# Patient Record
Sex: Female | Born: 1965 | Race: Black or African American | Hispanic: No | Marital: Married | Smoking: Never smoker
Health system: Southern US, Community
[De-identification: ages and names within clinical notes are randomized; demographics above are authoritative.]

## PROBLEM LIST (undated history)

## (undated) DIAGNOSIS — J449 Chronic obstructive pulmonary disease, unspecified: Secondary | ICD-10-CM

## (undated) DIAGNOSIS — I10 Essential (primary) hypertension: Secondary | ICD-10-CM

## (undated) DIAGNOSIS — G40909 Epilepsy, unspecified, not intractable, without status epilepticus: Secondary | ICD-10-CM

## (undated) DIAGNOSIS — J45909 Unspecified asthma, uncomplicated: Secondary | ICD-10-CM

## (undated) DIAGNOSIS — J9621 Acute and chronic respiratory failure with hypoxia: Secondary | ICD-10-CM

## (undated) DIAGNOSIS — F419 Anxiety disorder, unspecified: Secondary | ICD-10-CM

## (undated) HISTORY — PX: WISDOM TOOTH EXTRACTION: SHX21

## (undated) NOTE — *Deleted (*Deleted)
[facility] EMERGENCY DEPARTMENT Provider Note   CSN: [id number] Arrival date & time: 01/28/20  1423     History Chief Complaint  Patient presents with  . Altered Mental Status    Kathleen Cameron is a 49 y.o. female history of COPD, hypertension, who presented with altered mental status, and shortness of breath.  Patient has been having shortness of breath for the last week or so.  She states that it is worse with exertion.  She is on 2 L all the time and still gets very short of breath.  Patient also is more confused than usual.  Patient states that she has been seeing little babies but she knows they are not real.  Patient denies any drug use.  Patient states that she been compliant with her medicines.  She noticed bilateral lower extremity edema that comes and goes.  The history is provided by the patient.       Past Medical History:  Diagnosis Date  . Asthma   . COPD (chronic obstructive pulmonary disease) (HCC)   . Hypertension     Patient Active Problem List   Diagnosis Date Noted  . Anxiety 12/02/2019  . COPD exacerbation (HCC) 11/06/2019  . Depression 11/06/2019  . Acute on chronic respiratory failure with hypoxia and hypercapnia (HCC) 11/06/2019  . Respiratory failure (HCC) 11/06/2019  . Current moderate episode of major depressive disorder without prior episode (HCC) 09/20/2019  . Pedal edema 08/04/2019  . Chronic respiratory failure with hypoxia (HCC) 03/07/2019  . CKD (chronic kidney disease) 01/05/2019  . COPD (chronic obstructive pulmonary disease) (HCC) 09/29/2018  . Pulmonary nodules 09/27/2018  . Dyslipidemia 07/07/2018  . Essential hypertension 03/10/2018    History reviewed. No pertinent surgical history.   OB History   No obstetric history on file.     Family History  Family history unknown: Yes    Social History   Tobacco Use  . Smoking status: Never Smoker  . Smokeless tobacco: Never Used  Substance Use Topics   . Alcohol use: Yes    Alcohol/week: 2.0 standard drinks    Types: 2 Cans of beer per week  . Drug use: Never    Home Medications Prior to Admission medications   Medication Sig Start Date End Date Taking? Authorizing Provider  acetaminophen (TYLENOL) 500 MG tablet Take 500 mg by mouth daily as needed for mild pain or headache.   Yes [provider]  albuterol (VENTOLIN HFA) 108 (90 Base) MCG/ACT inhaler Inhale into the lungs every 6 (six) hours as needed for wheezing or shortness of breath.   Yes [provider]  ALPRAZolam (XANAX) 0.25 MG tablet Take 1 tablet (0.25 mg total) by mouth 3 (three) times daily as needed for anxiety. Patient taking differently: Take 0.25 mg by mouth 3 (three) times daily as needed for anxiety (shingles pain).  01/02/20  Yes Sagardia, Eilleen Kempf, MD  alum & mag hydroxide-simeth (MAALOX/MYLANTA) 200-200-20 MG/5ML suspension Take 15 mLs by mouth every 6 (six) hours as needed for indigestion or heartburn. 11/12/19  Yes Shah, Pratik D, DO  amLODipine (NORVASC) 5 MG tablet Take 1 tablet (5 mg total) by mouth daily. 08/09/19 02/19/20 Yes SagardiaEilleen Kempf, MD  aspirin EC 81 MG EC tablet Take 1 tablet (81 mg total) by mouth daily. 04/17/19  Yes [name], MD  bisoprolol (ZEBETA) 10 MG tablet Take 1 tablet (10 mg total) by mouth daily. 10/20/19  Yes Sagardia, Eilleen Kempf, MD  Budeson-Glycopyrrol-Formoterol (BREZTRI AEROSPHERE) 160-9-4.8  MCG/ACT AERO Inhale 2 puffs into the lungs in the morning and at bedtime. Patient taking differently: Inhale 1 puff into the lungs See admin instructions. Inhale 1 puff into the lungs when walking to bathroom - 6-7 times daily 12/02/19  Yes [name], NP  Dextromethorphan-guaiFENesin [facility] DM MAXIMUM STRENGTH) 60-1200 MG TB12 Take 1 tablet by mouth 2 (two) times daily as needed (congestion).   Yes [provider]  escitalopram (LEXAPRO) 10 MG tablet Take 10 mg by mouth daily. 01/02/20  Yes [provider]  famotidine (PEPCID) 10 MG tablet Take 10 mg by mouth 2 (two) times daily as needed for heartburn or indigestion (acid reflux).   Yes [provider]  hydrochlorothiazide (HYDRODIURIL) 25 MG tablet Take 25 mg by mouth daily. 09/07/19  Yes [provider]  ipratropium-albuterol (DUONEB) 0.5-2.5 (3) MG/3ML SOLN Take 3 mLs by nebulization every 6 (six) hours as needed. Patient taking differently: Take 3 mLs by nebulization every 6 (six) hours as needed (Shortness of Breath).  05/03/19  Yes [name], NP  nitroGLYCERIN (NITROSTAT) 0.4 MG SL tablet PLACE 1 TABLET(0.4 MG TOTAL) UNDER THE TONGUE EVERY 5 MINUTES AS NEEDED FOR CHEST PAIN Patient taking differently: Place 0.4 mg under the tongue every 5 (five) minutes as needed for chest pain.  01/10/20  Yes SagardiaEilleen Kempf, MD  OXYGEN Inhale 2 L into the lungs continuous.   Yes [provider]  pantoprazole (PROTONIX) 40 MG tablet Take 1 tablet (40 mg total) by mouth daily. 10/27/19  Yes Sagardia, Eilleen Kempf, MD  polyethylene glycol (MIRALAX / GLYCOLAX) 17 g packet Take 17 g by mouth daily as needed (constipation).   Yes [provider]  prednisoLONE acetate (PRED FORTE) 1 % ophthalmic suspension Place 1 drop into the left eye 4 (four) times daily. 01/10/20  Yes [provider]  rosuvastatin (CRESTOR) 20 MG tablet Take 20 mg by mouth daily.   Yes [provider]  valACYclovir (VALTREX) 1000 MG tablet Take 1,000 mg by mouth daily. 01/10/20  Yes [provider]  escitalopram (LEXAPRO) 20 MG tablet Take 1 tablet (20 mg total) by mouth daily. Patient not taking: Reported on 01/28/2020 01/02/20 04/01/20  [name], MD  HYDROcodone-acetaminophen [facility]) 5-325 MG tablet Take 1 tablet by mouth every 6 (six) hours as needed. Patient not taking: Reported on 01/28/2020 01/19/20   [name], MD  rosuvastatin (CRESTOR) 40 MG tablet Take 1 tablet (40 mg total) by  mouth daily. Patient not taking: Reported on 01/28/2020 01/25/20 01/19/21  [name], MD    Allergies    Stiolto respimat [tiotropium bromide-olodaterol]  Review of Systems   Review of Systems  Psychiatric/Behavioral: Positive for confusion.  All other systems reviewed and are negative.   Physical Exam Updated Vital Signs BP (!) 155/92   Pulse (!) 58   Temp 97.8 F (36.6 C) (Oral)   Resp 16   SpO2 97%   Physical Exam Vitals and nursing note reviewed.  Constitutional:      Comments: Confused and sleepy but arousable  HENT:     Head: Normocephalic.     Nose: Nose normal.     Mouth/Throat:     Mouth: Mucous membranes are moist.  Eyes:     Extraocular Movements: Extraocular movements intact.     Pupils: Pupils are equal, round, and reactive to light.  Cardiovascular:     Rate and Rhythm: Normal rate and regular rhythm.     Pulses:  Normal pulses.     Heart sounds: Normal heart sounds.  Pulmonary:     Comments: Crackles bilateral bases, minimal wheezing throughout Abdominal:     General: Abdomen is flat.     Palpations: Abdomen is soft.  Musculoskeletal:        General: Normal range of motion.     Cervical back: Normal range of motion.     Comments: 1+ edema bilaterally  Skin:    General: Skin is warm.     Capillary Refill: Capillary refill takes less than 2 seconds.  Neurological:     Comments: Confused and ANO x2, normal strength and sensation bilateral arms and legs.  Psychiatric:        Mood and Affect: Mood normal.     ED Results / Procedures / Treatments   Labs (all labs ordered are listed, but only abnormal results are displayed) Labs Reviewed  COMPREHENSIVE METABOLIC PANEL - Abnormal; Notable for the following components:      Result Value   Sodium 123 (*)    Potassium 3.4 (*)    Chloride 72 (*)    CO2 37 (*)    Glucose, Bld 127 (*)    All other components within normal limits  CBC WITH DIFFERENTIAL/PLATELET - Abnormal; Notable for the  following components:   RBC 3.79 (*)    Hemoglobin 10.9 (*)    HCT 34.2 (*)    All other components within normal limits  URINALYSIS, ROUTINE W REFLEX MICROSCOPIC - Abnormal; Notable for the following components:   Protein, ur 30 (*)    All other components within normal limits  I-STAT ARTERIAL BLOOD GAS, ED - Abnormal; Notable for the following components:   pCO2 arterial 90.1 (*)    pO2, Arterial 67 (*)    Bicarbonate 53.1 (*)    TCO2 >50 (*)    Acid-Base Excess 23.0 (*)    Sodium 119 (*)    Potassium 3.3 (*)    All other components within normal limits  RESPIRATORY PANEL BY RT PCR (FLU A&B, COVID)  ETHANOL  RAPID URINE DRUG SCREEN, HOSP PERFORMED  OSMOLALITY  BRAIN NATRIURETIC PEPTIDE  OSMOLALITY, URINE  SODIUM, URINE, RANDOM  CBG MONITORING, ED  TROPONIN I (HIGH SENSITIVITY)    EKG EKG Interpretation  Date/Time:  Saturday January 28 2020 14:27:07 EDT Ventricular Rate:  57 PR Interval:    QRS Duration: 92 QT Interval:  475 QTC Calculation: 463 R Axis:   -47 Text Interpretation: Sinus rhythm Left anterior fascicular block Anteroseptal infarct, old Abnormal T, consider ischemia, lateral leads ST elevation, consider inferior injury poor baseline. grossly unchanged since previous Confirmed by Richardean Canal ([id number]) on 01/28/2020 2:58:16 PM   Radiology DG Chest Port 1 View  Result Date: 01/28/2020 CLINICAL DATA:  Altered mental status EXAM: PORTABLE CHEST 1 VIEW COMPARISON:  Chest radiograph November 09, 2019 FINDINGS: Multiple monitoring leads overlie the patient. Stable cardiac and mediastinal contours. Tortuosity of the thoracic aorta. No large area pulmonary consolidation. No pleural effusion or pneumothorax. Thoracic spine degenerative changes. IMPRESSION: No active disease. Electronically Signed   By: Annia Belt M.D.   On: 01/28/2020 16:22    Procedures Procedures (including critical care time)  CRITICAL CARE Performed by: Richardean Canal   Total critical care time:30  minutes  Critical care time was exclusive of separately billable procedures and treating other patients.  Critical care was necessary to treat or prevent imminent or life-threatening deterioration.  Critical care was time spent personally by me  on the following activities: development of treatment plan with patient and/or surrogate as well as nursing, discussions with consultants, evaluation of patient's response to treatment, examination of patient, obtaining history from patient or surrogate, ordering and performing treatments and interventions, ordering and review of laboratory studies, ordering and review of radiographic studies, pulse oximetry and re-evaluation of patient's condition.    Medications Ordered in ED Medications - No data to display  ED Course  I have reviewed the triage vital signs and the nursing notes.  Pertinent labs & imaging results that were available during my care of the patient were reviewed by me and considered in my medical decision making (see chart for details).    MDM Rules/Calculators/A&P                         Kathleen Cameron is a 33 y.o. female history of COPD here presenting with confusion and worsening shortness of breath. Worsening shortness of breath likely secondary to either COPD versus CHF.  Confusion could be multifactorial such as pneumonia or UTI or electrolyte abnormalities or brain mass.  Plan to get CT head and chest x-ray and urinalysis and CBC and CMP and troponin and BNP.   7:09 PM Sodium is 123.  Patient's pH is 7.37 and CO2 is 90 so she is compensated.  Albuterol and Solu-Medrol.  Patient CT head did not show a mass.  I think her hyponatremia likely secondary to hydrochlorothiazide.  Will fluid restrict patient.  Will admit for hyponatremia, COPD exacerbation. COVID negative.    Final Clinical Impression(s) / ED Diagnoses Final diagnoses:  None    Rx / DC Orders ED Discharge Orders    None       Charlynne Pander, MD  01/28/20 1910

---

## 1997-09-22 ENCOUNTER — Ambulatory Visit (HOSPITAL_COMMUNITY): Admission: RE | Admit: 1997-09-22 | Discharge: 1997-09-22 | Payer: Self-pay | Admitting: *Deleted

## 1997-10-02 ENCOUNTER — Other Ambulatory Visit: Admission: RE | Admit: 1997-10-02 | Discharge: 1997-10-02 | Payer: Self-pay | Admitting: *Deleted

## 1997-10-06 ENCOUNTER — Ambulatory Visit (HOSPITAL_COMMUNITY): Admission: RE | Admit: 1997-10-06 | Discharge: 1997-10-06 | Payer: Self-pay | Admitting: *Deleted

## 1998-01-20 ENCOUNTER — Inpatient Hospital Stay (HOSPITAL_COMMUNITY): Admission: AD | Admit: 1998-01-20 | Discharge: 1998-01-22 | Payer: Self-pay | Admitting: *Deleted

## 1998-03-06 ENCOUNTER — Inpatient Hospital Stay (HOSPITAL_COMMUNITY): Admission: AD | Admit: 1998-03-06 | Discharge: 1998-03-06 | Payer: Self-pay | Admitting: *Deleted

## 1998-03-09 ENCOUNTER — Inpatient Hospital Stay (HOSPITAL_COMMUNITY): Admission: AD | Admit: 1998-03-09 | Discharge: 1998-03-11 | Payer: Self-pay | Admitting: Obstetrics & Gynecology

## 2008-11-12 ENCOUNTER — Inpatient Hospital Stay (HOSPITAL_COMMUNITY): Admission: EM | Admit: 2008-11-12 | Discharge: 2008-11-13 | Payer: Self-pay | Admitting: Emergency Medicine

## 2010-07-28 LAB — URINALYSIS, ROUTINE W REFLEX MICROSCOPIC
Bilirubin Urine: NEGATIVE
Glucose, UA: NEGATIVE mg/dL
Ketones, ur: NEGATIVE mg/dL
Nitrite: NEGATIVE
Protein, ur: NEGATIVE mg/dL
Specific Gravity, Urine: 1.016 (ref 1.005–1.030)
Urobilinogen, UA: 1 mg/dL (ref 0.0–1.0)
pH: 7.5 (ref 5.0–8.0)

## 2010-07-28 LAB — COMPREHENSIVE METABOLIC PANEL
ALT: 12 U/L (ref 0–35)
AST: 20 U/L (ref 0–37)
Albumin: 4.2 g/dL (ref 3.5–5.2)
Alkaline Phosphatase: 44 U/L (ref 39–117)
BUN: 13 mg/dL (ref 6–23)
CO2: 26 mEq/L (ref 19–32)
Calcium: 9.5 mg/dL (ref 8.4–10.5)
Chloride: 104 mEq/L (ref 96–112)
Creatinine, Ser: 0.79 mg/dL (ref 0.4–1.2)
GFR calc Af Amer: >60 mL/min (ref >60)
GFR calc non Af Amer: >60 mL/min (ref >60)
Glucose, Bld: 97 mg/dL (ref 70–99)
Potassium: 3.8 mEq/L (ref 3.5–5.1)
Sodium: 137 mEq/L (ref 135–145)
Total Bilirubin: 0.8 mg/dL (ref 0.3–1.2)
Total Protein: 7.5 g/dL (ref 6.0–8.3)

## 2010-07-28 LAB — CBC
HCT: 34 % — ABNORMAL LOW (ref 36.0–46.0)
HCT: 40 % (ref 36.0–46.0)
Hemoglobin: 11.9 g/dL — ABNORMAL LOW (ref 12.0–15.0)
Hemoglobin: 14 g/dL (ref 12.0–15.0)
MCHC: 35 g/dL (ref 30.0–36.0)
MCHC: 35 g/dL (ref 30.0–36.0)
MCV: 89.1 fL (ref 78.0–100.0)
MCV: 89.6 fL (ref 78.0–100.0)
Platelets: 170 10*3/uL (ref 150–400)
Platelets: 190 10*3/uL (ref 150–400)
RBC: 3.82 MIL/uL — ABNORMAL LOW (ref 3.87–5.11)
RBC: 4.46 MIL/uL (ref 3.87–5.11)
RDW: 13 % (ref 11.5–15.5)
RDW: 13.1 % (ref 11.5–15.5)
WBC: 5.8 10*3/uL (ref 4.0–10.5)
WBC: 5.9 10*3/uL (ref 4.0–10.5)

## 2010-07-28 LAB — LIPID PANEL
Cholesterol: 232 mg/dL — ABNORMAL HIGH (ref 0–200)
HDL: 73 mg/dL (ref >39)
LDL Cholesterol: 136 mg/dL — ABNORMAL HIGH (ref 0–99)
Total CHOL/HDL Ratio: 3.2 RATIO
Triglycerides: 114 mg/dL (ref <150)
VLDL: 23 mg/dL (ref 0–40)

## 2010-07-28 LAB — URINE MICROSCOPIC-ADD ON

## 2010-07-28 LAB — DIFFERENTIAL
Basophils Absolute: 0.1 10*3/uL (ref 0.0–0.1)
Basophils Relative: 1 % (ref 0–1)
Eosinophils Absolute: 0.1 10*3/uL (ref 0.0–0.7)
Eosinophils Relative: 2 % (ref 0–5)
Lymphocytes Relative: 26 % (ref 12–46)
Lymphs Abs: 1.6 10*3/uL (ref 0.7–4.0)
Monocytes Absolute: 0.4 10*3/uL (ref 0.1–1.0)
Monocytes Relative: 7 % (ref 3–12)
Neutro Abs: 3.8 10*3/uL (ref 1.7–7.7)
Neutrophils Relative %: 64 % (ref 43–77)

## 2010-07-28 LAB — RAPID URINE DRUG SCREEN, HOSP PERFORMED
Amphetamines: NOT DETECTED
Barbiturates: NOT DETECTED
Benzodiazepines: NOT DETECTED
Cocaine: NOT DETECTED
Opiates: NOT DETECTED
Tetrahydrocannabinol: POSITIVE — AB

## 2010-07-28 LAB — POCT CARDIAC MARKERS
CKMB, poc: <1 ng/mL — ABNORMAL LOW (ref 1.0–8.0)
Myoglobin, poc: 37.7 ng/mL (ref 12–200)
Troponin i, poc: <0.05 ng/mL (ref 0.00–0.09)

## 2010-07-28 LAB — PROTIME-INR
INR: 1 (ref 0.00–1.49)
Prothrombin Time: 13.2 seconds (ref 11.6–15.2)

## 2010-07-28 LAB — TSH: TSH: 2.52 u[IU]/mL (ref 0.350–4.500)

## 2010-09-03 NOTE — Discharge Summary (Signed)
Kathleen Cameron, Kathleen Cameron        ACCOUNT NO.:  [id number]   MEDICAL RECORD NO.:  [id number]          PATIENT TYPE:  INP   LOCATION:  3703                         FACILITY:  [facility]   PHYSICIAN:  Eduard Clos, MDDATE OF BIRTH:  [birth year]   DATE OF ADMISSION:  11/12/2008  DATE OF DISCHARGE:                               DISCHARGE SUMMARY   COURSE IN HOSPITAL:  A 45 year old female with no significant past  medical history presenting with complaints of epistaxis, which was  profuse per patient.  On admission, the patient was found to have blood  pressure on 240/140, was started on antihypertensive, IV hydralazine  p.r.n. along with p.o. hydralazine and metoprolol.  The patient did not  have any headache or any chest pain, was not short of breath.  Her  epistaxis was self-limited after Afrin spray was delivered.  Her  creatinine was also normal at 0.7.  At this time, blood pressure has  been marginally controlled, systolic is around 148 now with a diastolic  less than 90.  The patient is symptomatic, is eager to go home.  We will  discharge home with follow up with Dr. Pearson Grippe, which has been  arranged already on November 21, 2008.  The patient advised not to smoke  cigarettes and also given information about Alcohol Anonymous.  The  patient also told to stop taking Goody's Powder.  The patient agrees to  do so.   PROCEDURES DURING THIS STAY:  Chest x-ray, which is normal.   PERTINENT LABS:  Cardiac enzyme less than 0.05 of troponin, LDL was 136.   FINAL DIAGNOSES:  1. Axillary hypertension, new onset hypertension.  2. Epistaxis.  3. Ongoing tobacco abuse.  4. History of alcoholism.   MEDICATIONS AT DISCHARGE:  1. Hydralazine 25 mg p.o. 4 times daily.  2. Lopressor 50 mg p.o. b.i.d.  3. Chantix starter dose pack.  4. Thiamine 100 mg p.o. daily.   PLAN:  The patient advised to stop taking Goody's Powder, to be  compliant with the medication, to be on cardiac healthy diet.   Recheck  BMET, CBC within a week's time and lipid panel in another 1-2 months.  In the event of any recurrence of symptoms, to go to the nearest ER.      Eduard Clos, MD  Electronically Signed    ANK/MEDQ  D:  11/13/2008  T:  11/13/2008  Job:  [id number]   cc:   Massie Maroon, MD

## 2010-09-03 NOTE — H&P (Signed)
Kathleen Cameron, Kathleen Cameron        ACCOUNT NO.:  [id number]   MEDICAL RECORD NO.:  [id number]          PATIENT TYPE:  INP   LOCATION:  3703                         FACILITY:  [facility]   PHYSICIAN:  Virgie Dad, MD     DATE OF BIRTH:  09-02-1965   DATE OF ADMISSION:  11/12/2008  DATE OF DISCHARGE:                              HISTORY & PHYSICAL   PRIMARY CARE PHYSICIAN:  None.   CHIEF COMPLAINT:  Woke up with nosebleed anf discovered to have elevated  blood pressure in ED   HISTORY OF PRESENT ILLNESS:  This 45 year old African American female  who worked at the [employer], woke up this morning with bilateral  nasal bleed.  Bleeding was so perfuse, it was like a faucet, and she had  soaked 2 big bath towels.  When she came to the emergency room, her  blood pressure was 240/140.  She is not known to have high blood  pressure.  She is not taking any medicines that would make her blood  pressure go up.no complaints of headche dizziness or chest pains.denies  any cocaine sniffing nor any nose spray use.no nasal allergies or recent  colds   In ED, the epistaxis was controlled by local pressure and Afrin nasal  spray and the bleeding stopped spontaneously.   She has no headache.  No previous diagnosis of hypertension.   PAST HISTORY:  In 1983, she was told she had some thyroid problem and  was on a thyroid  tablet for a while, but did not have any followup.   SOCIAL HISTORY:  She works at [employer] every day 5 days a week.  She  drinks beer about 3 times a week and she smoked a pack of cigarette a  day.   FAMILY HISTORY:  Sister has high blood pressure, mother has high blood  pressure, father is alive and okay.  No CVA or diabetes in the family.   CURRENT MEDICATIONS:  None, although she admits to taking BC powder for  any aches and pains somewhat about 6 pack a week which consist of 12  tablets, but she had stopped it because she states that it is not  helping her  stomachache.   REVIEW OF SYSTEMS:  Rare headaches, no glasses.  No migraine.  HEENT:  No visual problems.  No blurring of vision.  NECK:  No neck pain.  No  goiter noticed on the neck.  History of thyroid problem in 1983 with no  followup.  CHEST:  No cough, no shortness of breath, no palpitations.  COR:  No chest pain, no shortness of breath.  GI:  Stomach pain which  she blames to her 3 beers, almost every day or at least 3 days a week.  No history of gastritis or pancreatitis.  GENITOURINARY:  Menses are  normal.  Last menstrual period was last month, regular and normal in  volume, does have a complaints of hot flashes.  MUSCULOSKELETAL:  Aches  and pains, she blames to working in the cleaners, relieved by [facility] powder  which she takes about 12 tablets a week.   PHYSICAL EXAMINATION:  VITAL  SIGNS:  When the patient was seen, she was  lying down in the gurney calmly with a blood pressure of 200/130,  monitor showing tachycardia of 90 per minute, regular sinus, no  arrhythmia.  Pulse is 90, respirations 20, oxygen saturation 100%, and  temperature 98.1.  SKIN:  Warm and dry.  HEENT:  Some degree of exophthalmos bilaterally, but no lid  lag.intranasal mucosa moist with areas of recent bleed worse on the  right  NECK:  Supple.  No thyromegaly noted.  Carotid abnormal upstroke.  Breast normal.  No discharge.  No masses.  HEART:  90 per minute.  S1 and S2 normal.  No S3.  No S4.  No  pericardial rub.  LUNGS:  Clear to palpation and auscultation.  ABDOMEN:  Normal bowel sounds.  No tenderness.  No rigidity.  EXTREMITIES:  No pedal edema.  No DVT.  NEURO:  Entirely, normal cerebellar, cranial nerves, spinal nerves, and  deep tendon reflexes are normal.  No paresis.  No Babinski.   ASSESSMENT AND PLAN:  This is a 45 year old African American female with  no previous history of hypertension, woke up with a nosebleed this  morning, nosebleed control by a Afrin nasal spray and topical  pressure  noted.  1. Acute hypertensive crisis.  Blood pressure 240/140 on admission.      The patient had received clonidine p.r.n., Apresoline IV, and the      Apresoline p.o., current blood pressure is 180/90.  We will      continue Apresoline 25 mg every 6 hours round-the-clock p.o.  We      will give 1 dose of IV Lasix.  Apresoline 10 mg IV q.6 h. p.r.n.      for a systolic greater than 180 mmHg.  We will do cardiac echo to      rule out left ventricular hypertrophy in the morning.  Continue      Afrin nasal spray, keeping in mind that Afrin will also elevate the      blood pressure, it is given p.r.n., in case the epistaxis will      recur.  Note, on examination of the intranasal cavity, no bleeding,      oozing, boggy nasal mucosa.  2. Thyroid problem by history with some degree of exophthalmos, no lid      lag.  We will take TSH, no goiter palpable.  We will need workup      regarding thyroid problem.  3. Urinalysis showed Trichomonas.  She was given Flagyl 2 g single      dose p.o. while in ED.  4. Perimenopausal.  Periods are still irregular, but she does have      vasomotor signs and symptoms.   Prognosis;good  EThics:Full Code      Virgie Dad, MD  Electronically Signed     EA/MEDQ  D:  11/12/2008  T:  11/13/2008  Job:  [id number]

## 2010-12-13 IMAGING — CR DG CHEST 2V
2 series · 2 of 2 positions shown · non-contrast
Comparison: None

CLINICAL DATA: Nosebleed.

CHEST - 2 VIEW

[w chest pa]
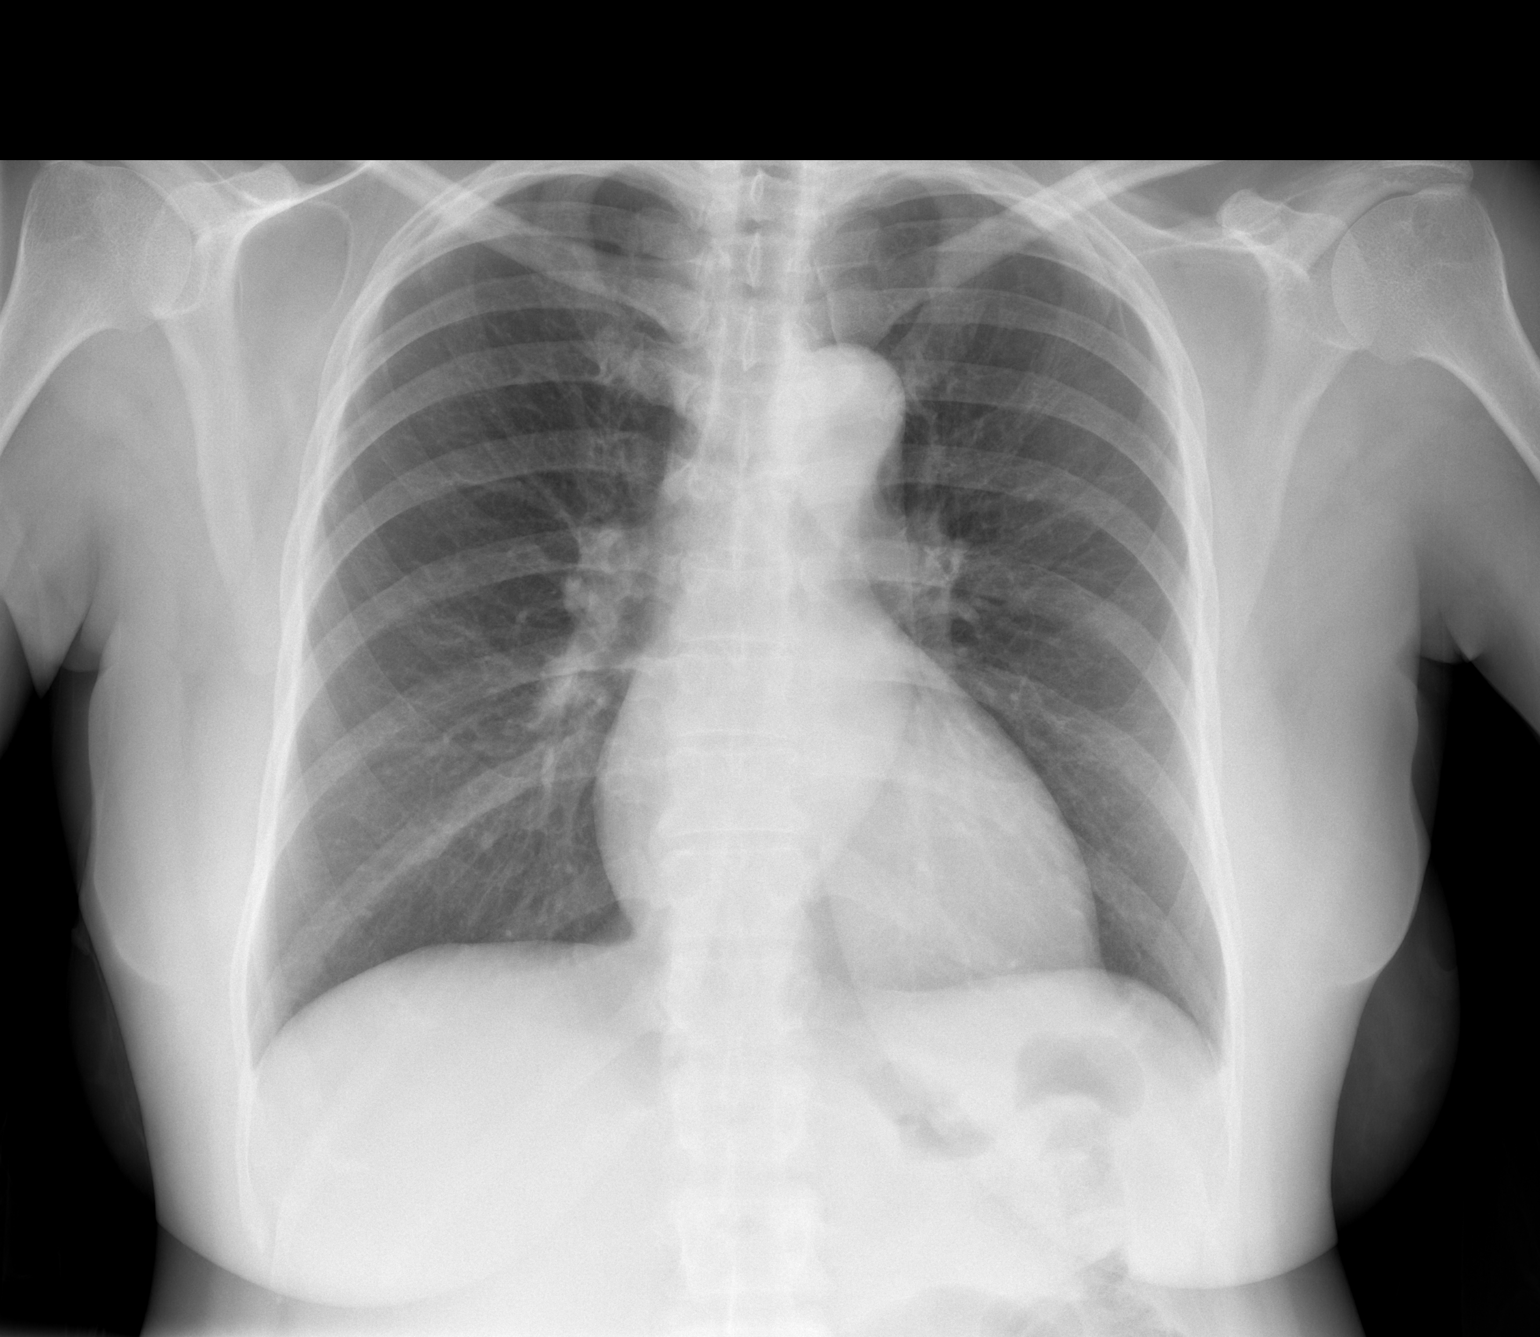

[w chest lat]
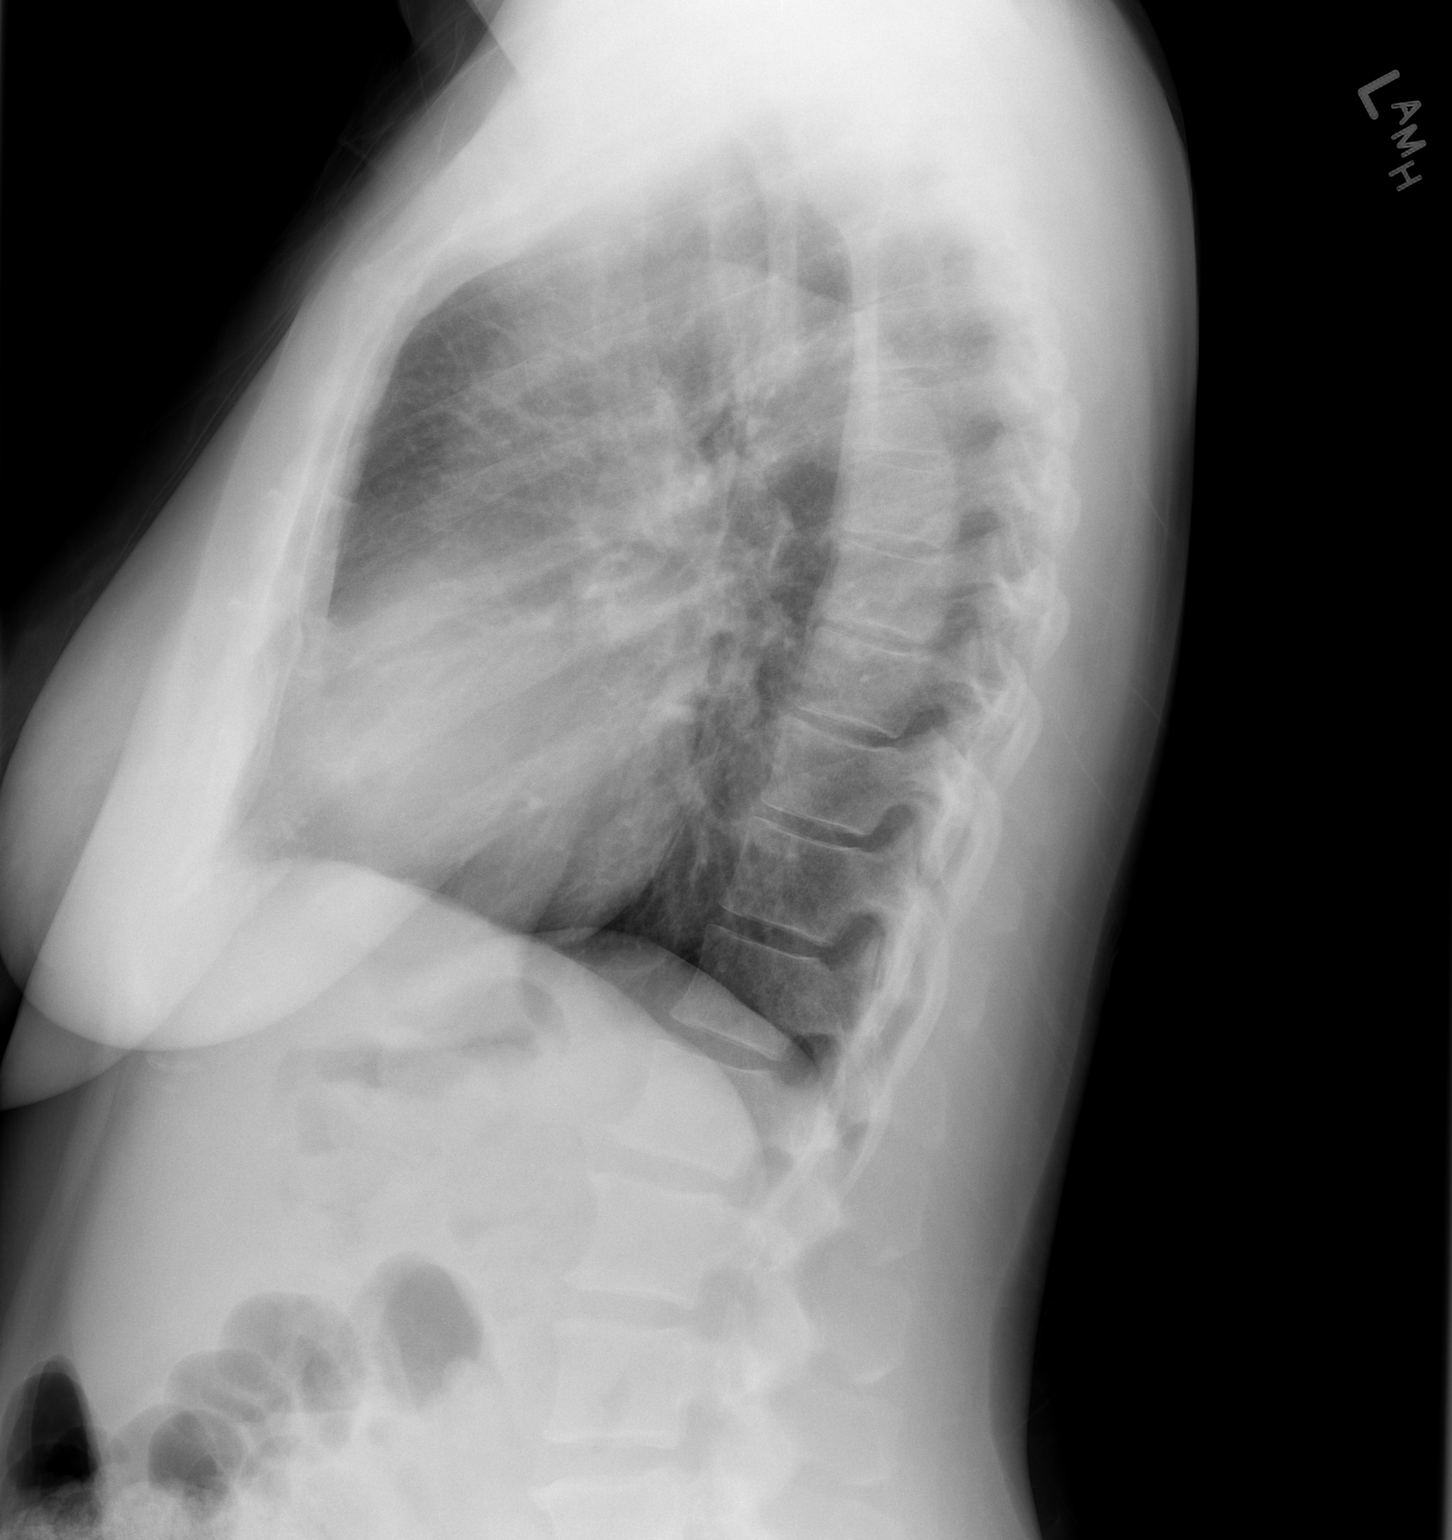

[2 of 2 positions shown; findings below may reference images not displayed]

FINDINGS: Heart size is normal.  The mediastinum is unremarkable.
Lungs are clear.  No effusions.  No bony abnormalities.
IMPRESSION: Normal chest

## 2013-09-18 ENCOUNTER — Emergency Department (HOSPITAL_COMMUNITY)
Admission: EM | Admit: 2013-09-18 | Discharge: 2013-09-18 | Disposition: A | Discharge status: Home / Self Care | Payer: No Typology Code available for payment source | Attending: Emergency Medicine | Admitting: Emergency Medicine

## 2013-09-18 ENCOUNTER — Encounter (HOSPITAL_COMMUNITY): Payer: Self-pay | Admitting: Emergency Medicine

## 2013-09-18 DIAGNOSIS — K055 Other periodontal diseases: Secondary | ICD-10-CM | POA: Insufficient documentation

## 2013-09-18 DIAGNOSIS — K068 Other specified disorders of gingiva and edentulous alveolar ridge: Secondary | ICD-10-CM

## 2013-09-18 DIAGNOSIS — I10 Essential (primary) hypertension: Secondary | ICD-10-CM | POA: Insufficient documentation

## 2013-09-18 HISTORY — DX: Essential (primary) hypertension: I10

## 2013-09-18 LAB — CBC WITH DIFFERENTIAL/PLATELET
Basophils Absolute: 0 10*3/uL (ref 0.0–0.1)
Basophils Relative: 1 % (ref 0–1)
Eosinophils Absolute: 0.1 10*3/uL (ref 0.0–0.7)
Eosinophils Relative: 1 % (ref 0–5)
HCT: 39.3 % (ref 36.0–46.0)
Hemoglobin: 14 g/dL (ref 12.0–15.0)
Lymphocytes Relative: 20 % (ref 12–46)
Lymphs Abs: 1.4 10*3/uL (ref 0.7–4.0)
MCH: 31.1 pg (ref 26.0–34.0)
MCHC: 35.6 g/dL (ref 30.0–36.0)
MCV: 87.3 fL (ref 78.0–100.0)
Monocytes Absolute: 0.5 10*3/uL (ref 0.1–1.0)
Monocytes Relative: 7 % (ref 3–12)
Neutro Abs: 5.1 10*3/uL (ref 1.7–7.7)
Neutrophils Relative %: 71 % (ref 43–77)
Platelets: 233 10*3/uL (ref 150–400)
RBC: 4.5 MIL/uL (ref 3.87–5.11)
RDW: 13.1 % (ref 11.5–15.5)
WBC: 7.1 10*3/uL (ref 4.0–10.5)

## 2013-09-18 LAB — COMPREHENSIVE METABOLIC PANEL
ALT: 15 U/L (ref 0–35)
AST: 23 U/L (ref 0–37)
Albumin: 4.4 g/dL (ref 3.5–5.2)
Alkaline Phosphatase: 79 U/L (ref 39–117)
BUN: 16 mg/dL (ref 6–23)
CO2: 24 mEq/L (ref 19–32)
Calcium: 10 mg/dL (ref 8.4–10.5)
Chloride: 99 mEq/L (ref 96–112)
Creatinine, Ser: 0.66 mg/dL (ref 0.50–1.10)
GFR calc Af Amer: >90 mL/min (ref >90)
GFR calc non Af Amer: >90 mL/min (ref >90)
Glucose, Bld: 95 mg/dL (ref 70–99)
Potassium: 3.7 mEq/L (ref 3.7–5.3)
Sodium: 141 mEq/L (ref 137–147)
Total Bilirubin: 0.5 mg/dL (ref 0.3–1.2)
Total Protein: 8.6 g/dL — ABNORMAL HIGH (ref 6.0–8.3)

## 2013-09-18 LAB — APTT: aPTT: 34 seconds (ref 24–37)

## 2013-09-18 LAB — PROTIME-INR
INR: 0.98 (ref 0.00–1.49)
Prothrombin Time: 12.8 seconds (ref 11.6–15.2)

## 2013-09-18 LAB — PRO B NATRIURETIC PEPTIDE: Pro B Natriuretic peptide (BNP): 379.9 pg/mL — ABNORMAL HIGH (ref 0–125)

## 2013-09-18 LAB — TROPONIN I: Troponin I: <0.3 ng/mL (ref <0.30)

## 2013-09-18 MED ORDER — NICARDIPINE HCL IN NACL 20-0.86 MG/200ML-% IV SOLN
5.0000 mg/h | Freq: Once | INTRAVENOUS | Status: AC
  Administered 2013-09-18: 5 mg/h via INTRAVENOUS
  Filled 2013-09-18: qty 200

## 2013-09-18 MED ORDER — LISINOPRIL 10 MG PO TABS: 20.0000 mg | ORAL_TABLET | Freq: Every day | ORAL | Status: DC

## 2013-09-18 MED ORDER — LABETALOL HCL 5 MG/ML IV SOLN
20.0000 mg | Freq: Once | INTRAVENOUS | Status: AC
  Administered 2013-09-18: 20 mg via INTRAVENOUS
  Filled 2013-09-18: qty 4

## 2013-09-18 MED ORDER — LISINOPRIL 10 MG PO TABS
10.0000 mg | ORAL_TABLET | Freq: Once | ORAL | Status: AC
  Administered 2013-09-18: 10 mg via ORAL
  Filled 2013-09-18: qty 1

## 2013-09-18 NOTE — ED Notes (Signed)
Family at bedside. 

## 2013-09-18 NOTE — ED Provider Notes (Signed)
CSN: [id number]     Arrival date & time 09/18/13  0142 History   First MD Initiated Contact with Patient 09/18/13 [phone]     Chief Complaint  Patient presents with  . Dental Problem     (Consider location/radiation/quality/duration/timing/severity/associated sxs/prior Treatment) HPI Patient states she started bleeding from her gingiva yesterday. It was not associated with any dental pain. Not associated with any trauma. Patient does have a history of high blood pressure and has not been on medications for many years. She states that in the past when her blood pressure has spiked she's had epistaxis but never gingival bleeding. She is on no blood thinners. She denies any lightheadedness, weakness or numbness. She denies any headache, chest pain, shortness of breath, abdominal pain, nausea or vomiting.  Past Medical History  Diagnosis Date  . Hypertension    History reviewed. No pertinent past surgical history. No family history on file. History  Substance Use Topics  . Smoking status: Never Smoker   . Smokeless tobacco: Not on file  . Alcohol Use: Yes   OB History   Grav Para Term Preterm Abortions TAB SAB Ect Mult Living                 Review of Systems  Constitutional: Negative for fever and chills.  HENT: Negative for dental problem and nosebleeds.   Respiratory: Negative for cough and shortness of breath.   Cardiovascular: Negative for chest pain.  Gastrointestinal: Negative for nausea, vomiting and abdominal pain.  Musculoskeletal: Negative for back pain, myalgias, neck pain and neck stiffness.  Skin: Negative for rash and wound.  Neurological: Negative for dizziness, syncope, weakness, light-headedness, numbness and headaches.  All other systems reviewed and are negative.     Allergies  Review of patient's allergies indicates no known allergies.  Home Medications   Prior to Admission medications   Not on File   BP 198/116  Pulse 73  Temp(Src) 98.1 F (36.7 C)  (Oral)  Resp 22  Wt 136 lb 4 oz (61.803 kg)  SpO2 96% Physical Exam  Nursing note and vitals reviewed. Constitutional: She is oriented to person, place, and time. She appears well-developed and well-nourished. No distress.  HENT:  Head: Normocephalic and atraumatic.  Mouth/Throat: Oropharynx is clear and moist.  No dental pain with percussion. Patient has slow oozing of bright red blood around the gingiva at the insertion site of the teeth. There is no evidence of trauma.  Eyes: EOM are normal. Pupils are equal, round, and reactive to light.  Neck: Normal range of motion. Neck supple.  Cardiovascular: Normal rate and regular rhythm.   Pulmonary/Chest: Effort normal and breath sounds normal. No respiratory distress. She has no wheezes. She has no rales. She exhibits no tenderness.  Abdominal: Soft. Bowel sounds are normal. She exhibits no distension and no mass. There is no tenderness. There is no rebound and no guarding.  Musculoskeletal: Normal range of motion. She exhibits no edema and no tenderness.  Neurological: She is alert and oriented to person, place, and time.  Patient is alert and oriented x3 with clear, goal oriented speech. Patient has 5/5 motor in all extremities. Sensation is intact to light touch. Bilateral finger-to-nose is normal with no signs of dysmetria. Patient has a normal gait and walks without assistance.   Skin: Skin is warm and dry. No rash noted. No erythema.  Psychiatric: She has a normal mood and affect. Her behavior is normal.    ED Course  Procedures (including  critical care time) Labs Review Labs Reviewed  CBC WITH DIFFERENTIAL  COMPREHENSIVE METABOLIC PANEL  TROPONIN I  PRO B NATRIURETIC PEPTIDE  PROTIME-INR  APTT    Imaging Review No results found.   EKG Interpretation   Date/Time:  Sunday Sep 18 2013 04:03:37 EDT Ventricular Rate:  75 PR Interval:  182 QRS Duration: 91 QT Interval:  430 QTC Calculation: 480 R Axis:   2 Text  Interpretation:  Sinus rhythm Probable left atrial enlargement  Anteroseptal infarct, old Baseline wander in lead(s) V3 V5 V6 Confirmed by  Ranae PalmsYELVERTON  MD, Elaf Clauson ([id number]) on 09/18/2013 5:09:07 AM      MDM   Final diagnoses:  None    Gingival bleeding likely secondary to hypertension. Patient was given a dose of labetalol with little effect. Start on Cardizem drip.  Bleeding has resolved. Patient's blood pressure is now controlled. She continues to be asymptomatic specifically denying headache, vision changes, chest pain or focal weakness. Advised her the importance of establishing a primary care Dr. to manage her blood pressure. Will start on lisinopril and given strict return precautions. Patient is voiced understanding.  Loren Raceravid Oswin Griffith, MD 09/18/13 [phone]

## 2013-09-18 NOTE — ED Notes (Signed)
Pt presents with dental pain but has 2 different elevated BP readings. Pt states she is supposed to take HTN medication but does not take any

## 2013-09-18 NOTE — ED Notes (Signed)
Patient is alert and orientedx4.  Patient was explained discharge instructions and they understood them with no questions.  The patient's significant other, Shaune Spittle  is taking the patient home.

## 2013-09-18 NOTE — Discharge Instructions (Signed)
Establish a primary care MD to follow your blood pressure. Take medication as prescribed. Return immediately for worsening bleeding, headache, focal weakness, vision changes or for any concerns.  Hypertension As your heart beats, it forces blood through your arteries. This force is your blood pressure. If the pressure is too high, it is called hypertension (HTN) or high blood pressure. HTN is dangerous because you may have it and not know it. High blood pressure may mean that your heart has to work harder to pump blood. Your arteries may be narrow or stiff. The extra work puts you at risk for heart disease, stroke, and other problems.  Blood pressure consists of two numbers, a higher number over a lower, 110/72, for example. It is stated as "110 over 72." The ideal is below 120 for the top number (systolic) and under 80 for the bottom (diastolic). Write down your blood pressure today. You should pay close attention to your blood pressure if you have certain conditions such as:  Heart failure.  Prior heart attack.  Diabetes  Chronic kidney disease.  Prior stroke.  Multiple risk factors for heart disease. To see if you have HTN, your blood pressure should be measured while you are seated with your arm held at the level of the heart. It should be measured at least twice. A one-time elevated blood pressure reading (especially in the Emergency Department) does not mean that you need treatment. There may be conditions in which the blood pressure is different between your right and left arms. It is important to see your caregiver soon for a recheck. Most people have essential hypertension which means that there is not a specific cause. This type of high blood pressure may be lowered by changing lifestyle factors such as:  Stress.  Smoking.  Lack of exercise.  Excessive weight.  Drug/tobacco/alcohol use.  Eating less salt. Most people do not have symptoms from high blood pressure until it has  caused damage to the body. Effective treatment can often prevent, delay or reduce that damage. TREATMENT  When a cause has been identified, treatment for high blood pressure is directed at the cause. There are a large number of medications to treat HTN. These fall into several categories, and your caregiver will help you select the medicines that are best for you. Medications may have side effects. You should review side effects with your caregiver. If your blood pressure stays high after you have made lifestyle changes or started on medicines,   Your medication(s) may need to be changed.  Other problems may need to be addressed.  Be certain you understand your prescriptions, and know how and when to take your medicine.  Be sure to follow up with your caregiver within the time frame advised (usually within two weeks) to have your blood pressure rechecked and to review your medications.  If you are taking more than one medicine to lower your blood pressure, make sure you know how and at what times they should be taken. Taking two medicines at the same time can result in blood pressure that is too low. SEEK IMMEDIATE MEDICAL CARE IF:  You develop a severe headache, blurred or changing vision, or confusion.  You have unusual weakness or numbness, or a faint feeling.  You have severe chest or abdominal pain, vomiting, or breathing problems. MAKE SURE YOU:   Understand these instructions.  Will watch your condition.  Will get help right away if you are not doing well or get worse. Document Released:  04/07/2005 Document Revised: 06/30/2011 Document Reviewed: 11/26/2007 [facility] Patient Information 2014 [place], [place].  Emergency Department Resource Guide 1) Find a Doctor and Pay Out of Pocket Although you won't have to find out who is covered by your insurance plan, it is a good idea to ask around and get recommendations. You will then need to call the office and see if the doctor you  have chosen will accept you as a new patient and what types of options they offer for patients who are self-pay. Some doctors offer discounts or will set up payment plans for their patients who do not have insurance, but you will need to ask so you aren't surprised when you get to your appointment.  2) Contact Your Local Health Department Not all health departments have doctors that can see patients for sick visits, but many do, so it is worth a call to see if yours does. If you don't know where your local health department is, you can check in your phone book. The CDC also has a tool to help you locate your state's health department, and many state websites also have listings of all of their local health departments.  3) Find a Walk-in Clinic If your illness is not likely to be very severe or complicated, you may want to try a walk in clinic. These are popping up all over the country in pharmacies, drugstores, and shopping centers. They're usually staffed by nurse practitioners or physician assistants that have been trained to treat common illnesses and complaints. They're usually fairly quick and inexpensive. However, if you have serious medical issues or chronic medical problems, these are probably not your best option.  No Primary Care Doctor: - Call Health Connect at  [phone] - they can help you locate a primary care doctor that  accepts your insurance, provides certain services, etc. - Physician Referral Service- [phone]  Chronic Pain Problems: Organization         Address  Phone   Notes  [facility]  [phone] Patients need to be referred by their primary care doctor.   Medication Assistance: Organization         Address  Phone   Notes  [facility] Medication [facility] [address] [place], [place] [address] [phone] --Must be a resident of [facility] -- Must have NO insurance coverage whatsoever (no Medicaid/ Medicare,  etc.) -- The pt. MUST have a primary care doctor that directs their care regularly and follows them in the community   [facility]  [phone]   [employer]  [phone]    Agencies that provide inexpensive medical care: Organization         Address  Phone   Notes  [facility]  [phone]   [facility]    [phone]   [facility] [address] [place], [place] [address] [phone]   [facility] [address] [place], [place] [phone]   Planned Parenthood    [phone]   [facility]    [phone]   [facility]  [address], [place] Phone:  [phone], Fax:  [phone] Hours of Operation:  9 am - 6 pm, M-F.  Also accepts Medicaid/Medicare and self-pay.  [facility]  [address], [place] Phone: [phone], Fax: [phone]. Hours of Operation:  8:30 am -  5:30 pm, M-F.  Also accepts Medicaid and self-pay.  [facility] [place] [address], [place] Phone: [phone]   [facility] [address], [place], [place] [phone], Ext. [phone] Mondays & Thursdays: 7-9 AM.  First 15 patients are seen on a first come, first serve basis.    Medicaid-accepting [facility] Providers:  Organization         Address  Phone   Notes  [facility] [address], [place] [phone] Also accepts self-pay patients.  [facility] [address], [place]  [phone]) [phone]   [facility] [address], [place] [phone]) [phone]   [facility] [address] [place] [address], [place] [phone]) [phone]   Renaye RakersVeita Bland [address], [place]   [phone]) [phone] Only accepts [place] Access [place] patients after they have their name applied to their card.   Self-Pay (no  insurance) in [facility]:  Organization         Address  Phone   Notes  Sickle Cell Patients, [facility] [address], [place] [phone]) [phone]   [facility] [address], [place] [phone]) [phone]   [facility] [place]  [address], [place] [phone]   [facility]/Dr. Osei-Bonsu  [address] [place] [address], [place] or [phone], [place] [phone] Phone number for both [place] and [place] locations is the same.  [facility] [address], [place] [phone]   [facility] [place] [address] [place] [address], [place] or [address] [phone]   [facility] [address], [place] [phone]) [phone], phone; [phone]) [phone], fax Sees patients 1st and 3rd Saturday of every month.  Must not qualify for public or private insurance (i.e. Medicaid, Medicare, [place] Health Choice, Veterans' Benefits)  Household income should be no more than 200% of the poverty level The clinic cannot treat you if you are pregnant or think you are pregnant  Sexually transmitted diseases are not treated at the clinic.    Dental Care: Organization         Address  Phone  Notes  [facility] of [facility] [address], [place] [phone] Accepts children up to age 50 who are enrolled in [place]icaid or [place] Health Choice; pregnant women with a Medicaid card; and children who have applied for Medicaid or [place] Health Choice, but were declined, whose parents can pay a reduced fee at time of service.  [facility] [place]  [address], [place] [phone] Accepts children up to age 50 who are enrolled in [place] or [place] Health Choice; pregnant women with a Medicaid card; and children who have applied for Medicaid or [place] Health Choice, but were  declined, whose parents can pay a reduced fee at time of service.  [facility]  [address], [place] [phone] Patients are seen by appointment only. Walk-ins are not accepted. [facility] will see patients 50 years of age and older. Monday - Tuesday (8am-5pm) Most Wednesdays (8:30-5pm) $30 per visit, cash only  [facility]  [address], [facility] [phone] Patients are seen by appointment only. Walk-ins are  not accepted. [facility] will see patients 50 years of age and older. One Wednesday Evening (Monthly: Volunteer Based).  $30 per visit, cash only  [facility]  [phone] for adults; Children under age 204, call Graduate Pediatric Dentistry at [phone]. Children aged 604-14, please call [phone] to request a pediatric application.  Dental services are provided in all areas of dental care including fillings, crowns and bridges, complete and partial dentures, implants, gum treatment, root canals, and extractions. Preventive care is also provided. Treatment is provided to both adults and children. Patients are selected via a lottery and there is often a waiting list.   [facility] [address], [place]  [phone] [email or web address]   [facility] [address], [place], [place] [phone], Ext. [phone] Second and Fourth Thursday of each month, opens at 6:30 AM; Clinic ends at 9 AM.  Patients are seen on a first-come first-served basis, and a limited number are seen during each clinic.   [facility]  [address], [place], [place] [phone]) [phone]   Eligibility Requirements You must have lived in [place], [place], or [place] counties for at least the last three months.   You cannot be eligible for state or federal sponsored National Cityhealthcare insurance, including CIGNAVeterans Administration, [place]icaid, or [employer]Medicare.   You generally cannot be  eligible for healthcare insurance through your employer.    How to apply: Eligibility screenings are held every Tuesday and Wednesday afternoon from 1:00 pm until 4:00 pm. You do not need an appointment for the interview!  [facility] [address], [place], [place] [phone]   [facility]  [phone]   [facility] [facility]  [phone]   [facility]  [phone]    Behavioral Health Resources in the Community: Intensive Outpatient Programs Organization         Address  Phone  Notes  [facility] [address], [place], [place] [phone]   [facility] [address], [place], [place] [phone]   [facility] [address], [place], [place]  [phone]   [facility] [address],  [place], [place] [phone] or [phone]   Substance Abuse Resources Organization         Address  Phone  Notes  [facility]  [phone]   [facility]  [phone]   [facility]  [phone]   [facility]  [phone]   Residential & Outpatient Substance Abuse Program  [phone]   Psychological Services Organization         Address  Phone  Notes  [facility]  [phone]- [phone]   [facility]  [phone]- [phone]   [facility] [address], [place] [phone] or [phone]    Mobile Crisis Teams Organization         Address  Phone  Notes  [facility], Mobile Crisis Care Unit  [phone]   [facility]  [address] [place], [place] [phone]   Doristine LocksSharon DeEsch [address] [place] [phone]    Self-Help/Support Groups Organization         Address  Phone             Notes  [facility]  - variety of support groups  [phone] Call for more information    Narcotics Anonymous (  NA), [facility] [address], [employer] [place]  2 meetings at this location   Residential Sports administrator         Address  Phone  Notes  [facility] [address],    [place]  [phone]   [facility]  [address] [place] [address], [place], [place] [phone]   [facility] [address] [place], [place] [phone] Admissions: 8am-3pm M-F  [facility] [address],    [place], [place] [phone]   [facility] [address] [place], [place], [place] [phone]   [facility] [address],  [place], [place] [phone]   [facility] - Intensive Outpatient [address], [place], [place] [phone]   [facility] ([facility] [address]  [place], [place] [phone] or [phone]   [facility] (RTS) [address] [place], [place] [phone] Accepts Medicaid  [facility] [address]  [place] [phone] Substance Abuse/Addiction Treatment   [facility] Organization         Address  Phone  Notes  [facility]  [phone]   Angie Fava, PhD [address]vin Knack [place], [place]   [phone] or [phone]   [facility]   [address] [place], [place] [phone]   [facility] [address] [place], [place] [phone] Insurance/Medicaid/sponsorship through [facility] and Families [address]                                    [place], [place] [phone] Therapy/tele-psych/case  [facility] [address], [place] [phone]    Dr. Lolly Mustache  [phone]   [facility] [place]  [employer] [facility] Dept. 1) [address], [place] 2) [address], [place] 3)  [address], [place] [phone]  [phone]   [facility] Child Abuse Hotline [phone] or [phone] (After Hours)

## 2013-09-18 NOTE — ED Notes (Signed)
The pt has had a toothache since yesterday and now she has some bleeding from her teeth

## 2013-09-20 ENCOUNTER — Encounter (HOSPITAL_COMMUNITY): Payer: Self-pay | Admitting: Emergency Medicine

## 2013-09-20 ENCOUNTER — Emergency Department (HOSPITAL_COMMUNITY)
Admission: EM | Admit: 2013-09-20 | Discharge: 2013-09-20 | Disposition: A | Discharge status: Home / Self Care | Payer: No Typology Code available for payment source | Attending: Emergency Medicine | Admitting: Emergency Medicine

## 2013-09-20 DIAGNOSIS — R220 Localized swelling, mass and lump, head: Secondary | ICD-10-CM | POA: Insufficient documentation

## 2013-09-20 DIAGNOSIS — Z79899 Other long term (current) drug therapy: Secondary | ICD-10-CM | POA: Insufficient documentation

## 2013-09-20 DIAGNOSIS — T448X5A Adverse effect of centrally-acting and adrenergic-neuron-blocking agents, initial encounter: Secondary | ICD-10-CM | POA: Insufficient documentation

## 2013-09-20 DIAGNOSIS — R221 Localized swelling, mass and lump, neck: Principal | ICD-10-CM

## 2013-09-20 DIAGNOSIS — T7840XA Allergy, unspecified, initial encounter: Secondary | ICD-10-CM

## 2013-09-20 DIAGNOSIS — I10 Essential (primary) hypertension: Secondary | ICD-10-CM

## 2013-09-20 LAB — CBC WITH DIFFERENTIAL/PLATELET
Basophils Absolute: 0 10*3/uL (ref 0.0–0.1)
Basophils Relative: 0 % (ref 0–1)
Eosinophils Absolute: 0.1 10*3/uL (ref 0.0–0.7)
Eosinophils Relative: 2 % (ref 0–5)
HCT: 39.4 % (ref 36.0–46.0)
Hemoglobin: 13.2 g/dL (ref 12.0–15.0)
Lymphocytes Relative: 28 % (ref 12–46)
Lymphs Abs: 1.5 10*3/uL (ref 0.7–4.0)
MCH: 29.9 pg (ref 26.0–34.0)
MCHC: 33.5 g/dL (ref 30.0–36.0)
MCV: 89.3 fL (ref 78.0–100.0)
Monocytes Absolute: 0.4 10*3/uL (ref 0.1–1.0)
Monocytes Relative: 7 % (ref 3–12)
Neutro Abs: 3.4 10*3/uL (ref 1.7–7.7)
Neutrophils Relative %: 63 % (ref 43–77)
Platelets: 236 10*3/uL (ref 150–400)
RBC: 4.41 MIL/uL (ref 3.87–5.11)
RDW: 13.2 % (ref 11.5–15.5)
WBC: 5.4 10*3/uL (ref 4.0–10.5)

## 2013-09-20 LAB — BASIC METABOLIC PANEL
BUN: 29 mg/dL — ABNORMAL HIGH (ref 6–23)
CO2: 23 mEq/L (ref 19–32)
Calcium: 9.8 mg/dL (ref 8.4–10.5)
Chloride: 102 mEq/L (ref 96–112)
Creatinine, Ser: 0.94 mg/dL (ref 0.50–1.10)
GFR calc Af Amer: 81 mL/min — ABNORMAL LOW (ref >90)
GFR calc non Af Amer: 70 mL/min — ABNORMAL LOW (ref >90)
Glucose, Bld: 94 mg/dL (ref 70–99)
Potassium: 3.9 mEq/L (ref 3.7–5.3)
Sodium: 139 mEq/L (ref 137–147)

## 2013-09-20 MED ORDER — METHYLPREDNISOLONE SODIUM SUCC 125 MG IJ SOLR
125.0000 mg | Freq: Once | INTRAMUSCULAR | Status: AC
  Administered 2013-09-20: 125 mg via INTRAVENOUS
  Filled 2013-09-20: qty 2

## 2013-09-20 MED ORDER — FAMOTIDINE IN NACL 20-0.9 MG/50ML-% IV SOLN
20.0000 mg | Freq: Once | INTRAVENOUS | Status: AC
  Administered 2013-09-20: 20 mg via INTRAVENOUS
  Filled 2013-09-20: qty 50

## 2013-09-20 MED ORDER — LABETALOL HCL 5 MG/ML IV SOLN
40.0000 mg | Freq: Once | INTRAVENOUS | Status: AC
  Administered 2013-09-20: 40 mg via INTRAVENOUS
  Filled 2013-09-20: qty 8

## 2013-09-20 MED ORDER — METOPROLOL TARTRATE 50 MG PO TABS: 50.0000 mg | ORAL_TABLET | Freq: Two times a day (BID) | ORAL | Status: DC

## 2013-09-20 MED ORDER — CLONIDINE HCL 0.1 MG PO TABS
0.1000 mg | ORAL_TABLET | Freq: Once | ORAL | Status: AC
  Administered 2013-09-20: 0.1 mg via ORAL
  Filled 2013-09-20: qty 1

## 2013-09-20 MED ORDER — DIPHENHYDRAMINE HCL 50 MG/ML IJ SOLN
50.0000 mg | Freq: Once | INTRAMUSCULAR | Status: AC
  Administered 2013-09-20: 50 mg via INTRAVENOUS
  Filled 2013-09-20: qty 1

## 2013-09-20 MED ORDER — LABETALOL HCL 5 MG/ML IV SOLN
20.0000 mg | Freq: Once | INTRAVENOUS | Status: AC
  Administered 2013-09-20: 20 mg via INTRAVENOUS
  Filled 2013-09-20: qty 4

## 2013-09-20 NOTE — ED Notes (Signed)
Pt reports starting lisinopril on Sunday for newly dx HTN. States she takes in morning and evening. Took at 0700 today and started to develop swelling to left lower lip at 0830. Pt denies any swelling to tongue, airway. Denies SOB. Pt speaks in complete sentences. 100% on RA.

## 2013-09-20 NOTE — Discharge Planning (Signed)
[facility] Community Liaison  Spoke to patient about primary care resources and establishing care with a provider. Patient states she does have insurance but does not have a PCP. Patient states she is on her husbands insurance but does not have the card with her at this time. Instructed patient to contact me once she has her insurance information for assistance with finding a provider in the area. Resource guide and my contact information was provided for any future questions or concerns.

## 2013-09-20 NOTE — ED Notes (Signed)
Md Zammit at bedside. Lip swelling noted to be mildly decreased. Pt's airway intact. Continues to deny swelling to tongue, airway or SOB. Pt denies HA, dizziness or lightheadedness. Pt remains on monitor. AO x4.

## 2013-09-20 NOTE — ED Notes (Signed)
Md Estell Harpin made aware of pt's current pressure.

## 2013-09-20 NOTE — Discharge Instructions (Signed)
Follow up with your family md this week.  Take benadryl for itching or swelling

## 2013-09-20 NOTE — ED Provider Notes (Signed)
CSN: [id number]     Arrival date & time 09/20/13  1115 History   First MD Initiated Contact with Patient 09/20/13 1148     Chief Complaint  Patient presents with  . Angioedema     (Consider location/radiation/quality/duration/timing/severity/associated sxs/prior Treatment) Patient is a 48 y.o. female presenting with allergic reaction. The history is provided by the patient (the pt complains of swelling to her lower lip.  she started lisinopril a few days ago).  Allergic Reaction Presenting symptoms: no difficulty breathing, no difficulty swallowing and no rash   Severity:  Mild Prior allergic episodes:  No prior episodes Context: no animal exposure   Relieved by:  Nothing Worsened by:  Nothing tried Ineffective treatments:  None tried   Past Medical History  Diagnosis Date  . Hypertension    History reviewed. No pertinent past surgical history. History reviewed. No pertinent family history. History  Substance Use Topics  . Smoking status: Never Smoker   . Smokeless tobacco: Not on file  . Alcohol Use: Yes   OB History   Grav Para Term Preterm Abortions TAB SAB Ect Mult Living                 Review of Systems  Constitutional: Negative for appetite change and fatigue.  HENT: Negative for congestion, ear discharge, sinus pressure and trouble swallowing.        Swelling to lip  Eyes: Negative for discharge.  Respiratory: Negative for cough.   Cardiovascular: Negative for chest pain.  Gastrointestinal: Negative for abdominal pain and diarrhea.  Genitourinary: Negative for frequency and hematuria.  Musculoskeletal: Negative for back pain.  Skin: Negative for rash.  Neurological: Negative for seizures and headaches.  Psychiatric/Behavioral: Negative for hallucinations.      Allergies  Review of patient's allergies indicates no known allergies.  Home Medications   Prior to Admission medications   Medication Sig Start Date End Date Taking? Authorizing Provider   lisinopril (PRINIVIL,ZESTRIL) 10 MG tablet Take 2 tablets (20 mg total) by mouth daily. 09/18/13  Yes Loren Racer, MD  metoprolol (LOPRESSOR) 50 MG tablet Take 1 tablet (50 mg total) by mouth 2 (two) times daily. 09/20/13   Benny Lennert, MD   BP 202/105  Pulse 82  Temp(Src) 98.4 F (36.9 C) (Oral)  Resp 24  Wt 128 lb 4 oz (58.174 kg)  SpO2 100% Physical Exam  Constitutional: She is oriented to person, place, and time. She appears well-developed.  HENT:  Head: Normocephalic.  Mild swelling lower lip  Eyes: Conjunctivae and EOM are normal. No scleral icterus.  Neck: Neck supple. No thyromegaly present.  Cardiovascular: Normal rate and regular rhythm.  Exam reveals no gallop and no friction rub.   No murmur heard. Pulmonary/Chest: No stridor. She has no wheezes. She has no rales. She exhibits no tenderness.  Abdominal: She exhibits no distension. There is no tenderness. There is no rebound.  Musculoskeletal: Normal range of motion. She exhibits no edema.  Lymphadenopathy:    She has no cervical adenopathy.  Neurological: She is oriented to person, place, and time. She exhibits normal muscle tone. Coordination normal.  Skin: No rash noted. No erythema.  Psychiatric: She has a normal mood and affect. Her behavior is normal.    ED Course  Procedures (including critical care time) Labs Review Labs Reviewed  BASIC METABOLIC PANEL - Abnormal; Notable for the following:    BUN 29 (*)    GFR calc non Af Amer 70 (*)    GFR  calc Af Amer 81 (*)    All other components within normal limits  CBC WITH DIFFERENTIAL    Imaging Review No results found.   EKG Interpretation None      MDM   Final diagnoses:  Allergic reaction  HTN (hypertension)        Benny LennertJoseph L Gerardo Territo, MD 09/20/13 [phone]

## 2013-09-20 NOTE — ED Notes (Signed)
Per pt sts she was given lisinopril on Sunday for hypertension. Pt sts around 8:30 noticed oral swelling. Pt lip very swollen. Denies trouble breathing or swallowing.

## 2013-09-20 NOTE — ED Notes (Signed)
Md Estell Harpin made aware of pt's symptoms. Orders placed.

## 2013-10-28 ENCOUNTER — Encounter (HOSPITAL_COMMUNITY): Payer: Self-pay | Admitting: Emergency Medicine

## 2013-10-28 ENCOUNTER — Emergency Department (HOSPITAL_COMMUNITY)
Admission: EM | Admit: 2013-10-28 | Discharge: 2013-10-28 | Disposition: A | Discharge status: Home / Self Care | Payer: No Typology Code available for payment source | Attending: Emergency Medicine | Admitting: Emergency Medicine

## 2013-10-28 DIAGNOSIS — R011 Cardiac murmur, unspecified: Secondary | ICD-10-CM | POA: Insufficient documentation

## 2013-10-28 DIAGNOSIS — Z79899 Other long term (current) drug therapy: Secondary | ICD-10-CM | POA: Insufficient documentation

## 2013-10-28 DIAGNOSIS — I10 Essential (primary) hypertension: Secondary | ICD-10-CM | POA: Insufficient documentation

## 2013-10-28 DIAGNOSIS — Z760 Encounter for issue of repeat prescription: Secondary | ICD-10-CM | POA: Insufficient documentation

## 2013-10-28 LAB — I-STAT CHEM 8, ED
BUN: 21 mg/dL (ref 6–23)
Calcium, Ion: 1.26 mmol/L — ABNORMAL HIGH (ref 1.12–1.23)
Chloride: 107 mEq/L (ref 96–112)
Creatinine, Ser: 0.9 mg/dL (ref 0.50–1.10)
Glucose, Bld: 88 mg/dL (ref 70–99)
HCT: 41 % (ref 36.0–46.0)
Hemoglobin: 13.9 g/dL (ref 12.0–15.0)
Potassium: 4 mEq/L (ref 3.7–5.3)
Sodium: 143 mEq/L (ref 137–147)
TCO2: 24 mmol/L (ref 0–100)

## 2013-10-28 MED ORDER — METOPROLOL TARTRATE 50 MG PO TABS
50.0000 mg | ORAL_TABLET | Freq: Two times a day (BID) | ORAL | Status: DC
Start: 2013-10-28 — End: 2018-03-10

## 2013-10-28 MED ORDER — METOPROLOL TARTRATE 25 MG PO TABS
50.0000 mg | ORAL_TABLET | Freq: Once | ORAL | Status: AC
  Administered 2013-10-28: 50 mg via ORAL
  Filled 2013-10-28: qty 2

## 2013-10-28 MED ORDER — CLONIDINE HCL 0.1 MG PO TABS
0.1000 mg | ORAL_TABLET | Freq: Once | ORAL | Status: AC
  Administered 2013-10-28: 0.1 mg via ORAL
  Filled 2013-10-28: qty 1

## 2013-10-28 NOTE — ED Notes (Addendum)
Pt states she was at her doctors office today and her BP was elevated. Patient has been without her blood pressure medication for appox 1 week. Pt denies any dizziness, blurred vision, or CP. Pt was not given rx for BP meds when she was at her doctors.

## 2013-10-28 NOTE — ED Provider Notes (Signed)
CSN: [id number]     Arrival date & time 10/28/13  1550 History  This chart was scribed for Kathleen Dredge, PA-C working with Glynn Octave, MD by Evon Slack, ED Scribe. This patient was seen in room TR10C/TR10C and the patient's care was started at 5:35 PM.    Chief Complaint  Patient presents with  . Hypertension   The history is provided by the patient. No language interpreter was used.   HPI Comments: Kathleen Cameron is a 48 y.o. female who presents to the Emergency Department complaining of hypertension. She states she need a refill of her blood pressure medication. She states she went to the [facility] to try and get her medications. She states she hasn't taken her medication over the past week. She states she went to [facility] today and was advised to come to the ED. She denies headache, SOB, confusion, dizziness, blurred vision, and chest pain.  States she "feels great."  She only is here requesting her blood pressure medications.    Past Medical History  Diagnosis Date  . Hypertension    History reviewed. No pertinent past surgical history. History reviewed. No pertinent family history. History  Substance Use Topics  . Smoking status: Never Smoker   . Smokeless tobacco: Not on file  . Alcohol Use: Yes   OB History   Grav Para Term Preterm Abortions TAB SAB Ect Mult Living                 Review of Systems  Eyes: Negative for visual disturbance.  Respiratory: Negative for shortness of breath.   Cardiovascular: Negative for chest pain.  Neurological: Negative for dizziness.  Psychiatric/Behavioral: Negative for confusion.  All other systems reviewed and are negative.  Allergies  Review of patient's allergies indicates no known allergies.  Home Medications   Prior to Admission medications   Medication Sig Start Date End Date Taking? Authorizing Provider  lisinopril (PRINIVIL,ZESTRIL) 10 MG tablet Take 2 tablets (20 mg total) by mouth  daily. 09/18/13   Loren Racer, MD  metoprolol (LOPRESSOR) 50 MG tablet Take 1 tablet (50 mg total) by mouth 2 (two) times daily. 09/20/13   Benny Lennert, MD   Triage Vitals: BP 254/135  Pulse 81  Temp(Src) 98.1 F (36.7 C) (Oral)  Resp 15  SpO2 100%  Physical Exam  Nursing note and vitals reviewed. Constitutional: She appears well-developed and well-nourished. No distress.  HENT:  Head: Normocephalic and atraumatic.  Neck: Neck supple.  Cardiovascular: Normal rate and regular rhythm.   Murmur heard. Pulmonary/Chest: Effort normal and breath sounds normal. No respiratory distress. She has no wheezes. She has no rales.  Abdominal: Soft. She exhibits no distension. There is no tenderness. There is no rebound and no guarding.  Neurological: She is alert.  Skin: She is not diaphoretic.    ED Course  Procedures (including critical care time) DIAGNOSTIC STUDIES: Oxygen Saturation is 100% on RA, normal by my interpretation.    COORDINATION OF CARE:    Labs Review Labs Reviewed  I-STAT CHEM 8, ED - Abnormal; Notable for the following:    Calcium, Ion 1.26 (*)    All other components within normal limits    Imaging Review No results found.   EKG Interpretation None      MDM   Final diagnoses:  Essential hypertension  Medication refill    Pt with elevated BP, out of her home medications x 1 week.  She is completely asymptomatic.  Home medication given here.  Chem 8 shows normal renal function, improved from last month.  D/C home with PCP follow up with prescription for metoprolol and instructions to have BP rechecked within the next 3-5 days.  Discussed result, findings, treatment, and follow up  with patient.  Pt given return precautions.  Pt verbalizes understanding and agrees with plan.        I personally performed the services described in this documentation, which was scribed in my presence. The recorded information has been reviewed and is accurate.       Kachina VillageEmily Mende Biswell, PA-C 10/28/13 1826

## 2013-10-28 NOTE — Discharge Instructions (Signed)
Read the information below.  Use the prescribed medication as directed.  Please discuss all new medications with your pharmacist.  You may return to the Emergency Department at any time for worsening condition or any new symptoms that concern you.  If you develop chest pain, shortness of breath, weakness or numbness in your arms or legs, or have difficulty speaking or walking return to the ER for a recheck.      Hypertension Hypertension, commonly called high blood pressure, is when the force of blood pumping through your arteries is too strong. Your arteries are the blood vessels that carry blood from your heart throughout your body. A blood pressure reading consists of a higher number over a lower number, such as 110/72. The higher number (systolic) is the pressure inside your arteries when your heart pumps. The lower number (diastolic) is the pressure inside your arteries when your heart relaxes. Ideally you want your blood pressure below 120/80. Hypertension forces your heart to work harder to pump blood. Your arteries may become narrow or stiff. Having hypertension puts you at risk for heart disease, stroke, and other problems.  RISK FACTORS Some risk factors for high blood pressure are controllable. Others are not.  Risk factors you cannot control include:   Race. You may be at higher risk if you are African American.  Age. Risk increases with age.  Gender. Men are at higher risk than women before age 63 years. After age 15, women are at higher risk than men. Risk factors you can control include:  Not getting enough exercise or physical activity.  Being overweight.  Getting too much fat, sugar, calories, or salt in your diet.  Drinking too much alcohol. SIGNS AND SYMPTOMS Hypertension does not usually cause signs or symptoms. Extremely high blood pressure (hypertensive crisis) may cause headache, anxiety, shortness of breath, and nosebleed. DIAGNOSIS  To check if you have  hypertension, your health care provider will measure your blood pressure while you are seated, with your arm held at the level of your heart. It should be measured at least twice using the same arm. Certain conditions can cause a difference in blood pressure between your right and left arms. A blood pressure reading that is higher than normal on one occasion does not mean that you need treatment. If one blood pressure reading is high, ask your health care provider about having it checked again. TREATMENT  Treating high blood pressure includes making lifestyle changes and possibly taking medication. Living a healthy lifestyle can help lower high blood pressure. You may need to change some of your habits. Lifestyle changes may include:  Following the DASH diet. This diet is high in fruits, vegetables, and whole grains. It is low in salt, red meat, and added sugars.  Getting at least 2 1/2 hours of brisk physical activity every week.  Losing weight if necessary.  Not smoking.  Limiting alcoholic beverages.  Learning ways to reduce stress. If lifestyle changes are not enough to get your blood pressure under control, your health care provider may prescribe medicine. You may need to take more than one. Work closely with your health care provider to understand the risks and benefits. HOME CARE INSTRUCTIONS  Have your blood pressure rechecked as directed by your health care provider.   Only take medicine as directed by your health care provider. Follow the directions carefully. Blood pressure medicines must be taken as prescribed. The medicine does not work as well when you skip doses. Skipping doses also  puts you at risk for problems.   Do not smoke.   Monitor your blood pressure at home as directed by your health care provider. SEEK MEDICAL CARE IF:   You think you are having a reaction to medicines taken.  You have recurrent headaches or feel dizzy.  You have swelling in your  ankles.  You have trouble with your vision. SEEK IMMEDIATE MEDICAL CARE IF:  You develop a severe headache or confusion.  You have unusual weakness, numbness, or feel faint.  You have severe chest or abdominal pain.  You vomit repeatedly.  You have trouble breathing. MAKE SURE YOU:   Understand these instructions.  Will watch your condition.  Will get help right away if you are not doing well or get worse. Document Released: 04/07/2005 Document Revised: 04/12/2013 Document Reviewed: 01/28/2013 [facility] Patient Information 2015 [employer] [place]. This information is not intended to replace advice given to you by your health care provider. Make sure you discuss any questions you have with your health care provider.

## 2013-10-28 NOTE — ED Notes (Signed)
Declined W/C at D/C and was escorted to lobby by RN. 

## 2013-10-29 NOTE — ED Provider Notes (Signed)
Medical screening examination/treatment/procedure(s) were performed by non-physician practitioner and as supervising physician I was immediately available for consultation/collaboration.   EKG Interpretation None        Harue Pribble, MD 10/29/13 0001 

## 2018-03-10 ENCOUNTER — Other Ambulatory Visit: Payer: Self-pay

## 2018-03-10 ENCOUNTER — Encounter: Payer: Self-pay | Admitting: Emergency Medicine

## 2018-03-10 ENCOUNTER — Ambulatory Visit (INDEPENDENT_AMBULATORY_CARE_PROVIDER_SITE_OTHER): Payer: Commercial Managed Care - PPO | Admitting: Emergency Medicine

## 2018-03-10 VITALS — BP 200/120 | HR 94 | Temp 98.2°F | Resp 16 | Ht 61.0 in | Wt 115.6 lb

## 2018-03-10 DIAGNOSIS — I10 Essential (primary) hypertension: Secondary | ICD-10-CM | POA: Insufficient documentation

## 2018-03-10 DIAGNOSIS — Z1159 Encounter for screening for other viral diseases: Secondary | ICD-10-CM

## 2018-03-10 DIAGNOSIS — Z23 Encounter for immunization: Secondary | ICD-10-CM

## 2018-03-10 DIAGNOSIS — Z1211 Encounter for screening for malignant neoplasm of colon: Secondary | ICD-10-CM

## 2018-03-10 DIAGNOSIS — Z124 Encounter for screening for malignant neoplasm of cervix: Secondary | ICD-10-CM | POA: Diagnosis not present

## 2018-03-10 DIAGNOSIS — Z1239 Encounter for other screening for malignant neoplasm of breast: Secondary | ICD-10-CM

## 2018-03-10 MED ORDER — AMLODIPINE BESYLATE 5 MG PO TABS: 5.0000 mg | ORAL_TABLET | Freq: Every day | ORAL | 3 refills | Status: DC

## 2018-03-10 MED ORDER — LISINOPRIL 20 MG PO TABS: 20.0000 mg | ORAL_TABLET | Freq: Every day | ORAL | 3 refills | Status: DC

## 2018-03-10 NOTE — Progress Notes (Signed)
Kathleen Cameron 52 y.o.   Chief Complaint  Patient presents with  . Medication Refill    Lisinopril and Metoprolol Tartrate   BP Readings from Last 3 Encounters:  03/10/18 (!) 252/141  10/28/13 (!) 246/129  09/20/13 (!) 198/117    HISTORY OF PRESENT ILLNESS: This is a 52 y.o. female new patient to this practice.  Has a history of hypertension but has been off medications now for 3 months.  First diagnosed several years ago.  Asymptomatic.  Used to be on lisinopril and metoprolol but did not like the way it made her feel.  Felt tired all the time.  Non-smoker.  Non-EtOH abuser.  Positive family history for hypertension.  Denies chest pain, difficulty breathing, headache, nausea or vomiting, neurological symptoms, or any other associated symptoms.  HPI   Prior to Admission medications   Medication Sig Start Date End Date Taking? Authorizing Provider  lisinopril (PRINIVIL,ZESTRIL) 10 MG tablet Take 2 tablets (20 mg total) by mouth daily. 09/18/13  Yes Loren Racer, MD  metoprolol (LOPRESSOR) 50 MG tablet Take 1 tablet (50 mg total) by mouth 2 (two) times daily. 10/28/13  Yes West, Emily, PA-C    No Known Allergies  There are no active problems to display for this patient.   Past Medical History:  Diagnosis Date  . Hypertension     No past surgical history on file.  Social History   Socioeconomic History  . Marital status: Single    Spouse name: Not on file  . Number of children: Not on file  . Years of education: Not on file  . Highest education level: Not on file  Occupational History  . Not on file  Social Needs  . Financial resource strain: Not on file  . Food insecurity:    Worry: Not on file    Inability: Not on file  . Transportation needs:    Medical: Not on file    Non-medical: Not on file  Tobacco Use  . Smoking status: Never Smoker  . Smokeless tobacco: Never Used  Substance and Sexual Activity  . Alcohol use: Yes    Alcohol/week: 2.0  standard drinks    Types: 2 Cans of beer per week  . Drug use: Never  . Sexual activity: Not on file  Lifestyle  . Physical activity:    Days per week: Not on file    Minutes per session: Not on file  . Stress: Not on file  Relationships  . Social connections:    Talks on phone: Not on file    Gets together: Not on file    Attends religious service: Not on file    Active member of club or organization: Not on file    Attends meetings of clubs or organizations: Not on file    Relationship status: Not on file  . Intimate partner violence:    Fear of current or ex partner: Not on file    Emotionally abused: Not on file    Physically abused: Not on file    Forced sexual activity: Not on file  Other Topics Concern  . Not on file  Social History Narrative  . Not on file    No family history on file.   Review of Systems  Constitutional: Negative.  Negative for chills, fever, malaise/fatigue and weight loss.  HENT: Negative.  Negative for sore throat.   Eyes: Negative.  Negative for blurred vision, double vision, discharge and redness.  Respiratory: Negative.  Negative for  cough, hemoptysis and shortness of breath.   Cardiovascular: Negative.  Negative for chest pain, palpitations, claudication and leg swelling.  Gastrointestinal: Negative.  Negative for abdominal pain, blood in stool, diarrhea, melena, nausea and vomiting.  Genitourinary: Negative.  Negative for dysuria and hematuria.  Musculoskeletal: Negative.  Negative for back pain, myalgias and neck pain.  Skin: Negative.  Negative for rash.  Neurological: Negative.  Negative for dizziness, sensory change, speech change, focal weakness, seizures, loss of consciousness and headaches.  Endo/Heme/Allergies: Negative.   All other systems reviewed and are negative.   Vitals:   03/10/18 0920 03/10/18 1040  BP: (!) 252/141 (!) 200/120  Pulse: 94   Resp: 16   Temp: 98.2 F (36.8 C)   SpO2: 97%      Physical Exam    Constitutional: She is oriented to person, place, and time. She appears well-developed and well-nourished.  HENT:  Head: Normocephalic and atraumatic.  Nose: Nose normal.  Mouth/Throat: Oropharynx is clear and moist.  Eyes: Pupils are equal, round, and reactive to light. Conjunctivae and EOM are normal.  Neck: Normal range of motion. Neck supple. No thyromegaly present.  Cardiovascular: Normal rate, regular rhythm, normal heart sounds and intact distal pulses.  Pulmonary/Chest: Effort normal and breath sounds normal.  Abdominal: Soft. Bowel sounds are normal. There is no tenderness.  Musculoskeletal: Normal range of motion. She exhibits no edema.  Lymphadenopathy:    She has no cervical adenopathy.  Neurological: She is alert and oriented to person, place, and time. No sensory deficit. She exhibits normal muscle tone. Coordination normal.  Skin: Skin is warm and dry. Capillary refill takes less than 2 seconds. No rash noted.  Psychiatric: She has a normal mood and affect. Her behavior is normal.  Vitals reviewed.  EKG: Normal sinus rhythm with ventricular rate of 85/min.  No acute ischemic changes.  A total of 45 minutes was spent in the room with the patient, greater than 50% of which was in counseling/coordination of care regarding hypertension, management, diet, medications, EKG results, and need for follow-up in 4 weeks..   ASSESSMENT & PLAN: Kathleen Cameron was seen today for medication refill.  Diagnoses and all orders for this visit:  Uncontrolled hypertension -     EKG 12-Lead -     Comprehensive metabolic panel -     CBC with Differential/Platelet -     Hemoglobin A1c -     Lipid panel -     lisinopril (PRINIVIL,ZESTRIL) 20 MG tablet; Take 1 tablet (20 mg total) by mouth daily. -     amLODipine (NORVASC) 5 MG tablet; Take 1 tablet (5 mg total) by mouth daily.  Need for diphtheria-tetanus-pertussis (Tdap) vaccine -     Cancel: Tdap vaccine greater than or equal to 7yo  IM  Cervical cancer screening -     Ambulatory referral to Obstetrics / Gynecology  Colon cancer screening -     Ambulatory referral to Gastroenterology  Breast cancer screening -     MM Digital Screening; Future  Need for hepatitis C screening test -     Hepatitis C antibody    Patient Instructions       If you have lab work done today you will be contacted with your lab results within the next 2 weeks.  If you have not heard from [place] then please contact us. The fastest way to get your results is to register for My Chart.   IF you received an x-ray today, you will receive an  invoice from [facility]. Please contact [facility] at [phone] with questions or concerns regarding your invoice.   IF you received labwork today, you will receive an invoice from [facility]. Please contact [facility] at [phone] with questions or concerns regarding your invoice.   Our billing staff will not be able to assist you with questions regarding bills from these companies.  You will be contacted with the lab results as soon as they are available. The fastest way to get your results is to activate your My Chart account. Instructions are located on the last page of this paperwork. If you have not heard from [place] regarding the results in 2 weeks, please contact this office.     How to Take Your Blood Pressure You can take your blood pressure at home with a machine. You may need to check your blood pressure at home:  To check if you have high blood pressure (hypertension).  To check your blood pressure over time.  To make sure your blood pressure medicine is working.  Supplies needed: You will need a blood pressure machine, or monitor. You can buy one at a drugstore or online. When choosing one:  Choose one with an arm cuff.  Choose one that wraps around your upper arm. Only one finger should fit between your arm and the cuff.  Do not choose one that measures your  blood pressure from your wrist or finger.  Your doctor can suggest a monitor. How to prepare Avoid these things for 30 minutes before checking your blood pressure:  Drinking caffeine.  Drinking alcohol.  Eating.  Smoking.  Exercising.  Five minutes before checking your blood pressure:  Pee.  Sit in a dining chair. Avoid sitting in a soft couch or armchair.  Be quiet. Do not talk.  How to take your blood pressure Follow the instructions that came with your machine. If you have a digital blood pressure monitor, these may be the instructions: 1. Sit up straight. 2. Place your feet on the floor. Do not cross your ankles or legs. 3. Rest your left arm at the level of your heart. You may rest it on a table, desk, or chair. 4. Pull up your shirt sleeve. 5. Wrap the blood pressure cuff around the upper part of your left arm. The cuff should be 1 inch (2.5 cm) above your elbow. It is best to wrap the cuff around bare skin. 6. Fit the cuff snugly around your arm. You should be able to place only one finger between the cuff and your arm. 7. Put the cord inside the groove of your elbow. 8. Press the power button. 9. Sit quietly while the cuff fills with air and loses air. 10. Write down the numbers on the screen. 11. Wait 2-3 minutes and then repeat steps 1-10.  What do the numbers mean? Two numbers make up your blood pressure. The first number is called systolic pressure. The second is called diastolic pressure. An example of a blood pressure reading is "120 over 80" (or 120/80). If you are an adult and do not have a medical condition, use this guide to find out if your blood pressure is normal: Normal  First number: below 120.  Second number: below 80. Elevated  First number: 120-129.  Second number: below 80. Hypertension stage 1  First number: 130-139.  Second number: 80-89. Hypertension stage 2  First number: 140 or above.  Second number: 90 or above. Your blood  pressure is above normal even  if only the top or bottom number is above normal. Follow these instructions at home:  Check your blood pressure as often as your doctor tells you to.  Take your monitor to your next doctor's appointment. Your doctor will: ? Make sure you are using it correctly. ? Make sure it is working right.  Make sure you understand what your blood pressure numbers should be.  Tell your doctor if your medicines are causing side effects. Contact a doctor if:  Your blood pressure keeps being high. Get help right away if:  Your first blood pressure number is higher than 180.  Your second blood pressure number is higher than 120. This information is not intended to replace advice given to you by your health care provider. Make sure you discuss any questions you have with your health care provider. Document Released: 03/20/2008 Document Revised: 03/05/2016 Document Reviewed: 09/14/2015 Elsevier Interactive Patient Education  2018 [employer].  Hypertension Hypertension, commonly called high blood pressure, is when the force of blood pumping through the arteries is too strong. The arteries are the blood vessels that carry blood from the heart throughout the body. Hypertension forces the heart to work harder to pump blood and may cause arteries to become narrow or stiff. Having untreated or uncontrolled hypertension can cause heart attacks, strokes, kidney disease, and other problems. A blood pressure reading consists of a higher number over a lower number. Ideally, your blood pressure should be below 120/80. The first ("top") number is called the systolic pressure. It is a measure of the pressure in your arteries as your heart beats. The second ("bottom") number is called the diastolic pressure. It is a measure of the pressure in your arteries as the heart relaxes. What are the causes? The cause of this condition is not known. What increases the risk? Some risk factors for  high blood pressure are under your control. Others are not. Factors you can change  Smoking.  Having type 2 diabetes mellitus, high cholesterol, or both.  Not getting enough exercise or physical activity.  Being overweight.  Having too much fat, sugar, calories, or salt (sodium) in your diet.  Drinking too much alcohol. Factors that are difficult or impossible to change  Having chronic kidney disease.  Having a family history of high blood pressure.  Age. Risk increases with age.  Race. You may be at higher risk if you are African-American.  Gender. Men are at higher risk than women before age 52. After age 12, women are at higher risk than men.  Having obstructive sleep apnea.  Stress. What are the signs or symptoms? Extremely high blood pressure (hypertensive crisis) may cause:  Headache.  Anxiety.  Shortness of breath.  Nosebleed.  Nausea and vomiting.  Severe chest pain.  Jerky movements you cannot control (seizures).  How is this diagnosed? This condition is diagnosed by measuring your blood pressure while you are seated, with your arm resting on a surface. The cuff of the blood pressure monitor will be placed directly against the skin of your upper arm at the level of your heart. It should be measured at least twice using the same arm. Certain conditions can cause a difference in blood pressure between your right and left arms. Certain factors can cause blood pressure readings to be lower or higher than normal (elevated) for a short period of time:  When your blood pressure is higher when you are in a health care provider's office than when you are at home, this  is called white coat hypertension. Most people with this condition do not need medicines.  When your blood pressure is higher at home than when you are in a health care provider's office, this is called masked hypertension. Most people with this condition may need medicines to control blood  pressure.  If you have a high blood pressure reading during one visit or you have normal blood pressure with other risk factors:  You may be asked to return on a different day to have your blood pressure checked again.  You may be asked to monitor your blood pressure at home for 1 week or longer.  If you are diagnosed with hypertension, you may have other blood or imaging tests to help your health care provider understand your overall risk for other conditions. How is this treated? This condition is treated by making healthy lifestyle changes, such as eating healthy foods, exercising more, and reducing your alcohol intake. Your health care provider may prescribe medicine if lifestyle changes are not enough to get your blood pressure under control, and if:  Your systolic blood pressure is above 130.  Your diastolic blood pressure is above 80.  Your personal target blood pressure may vary depending on your medical conditions, your age, and other factors. Follow these instructions at home: Eating and drinking  Eat a diet that is high in fiber and potassium, and low in sodium, added sugar, and fat. An example eating plan is called the DASH (Dietary Approaches to Stop Hypertension) diet. To eat this way: ? Eat plenty of fresh fruits and vegetables. Try to fill half of your plate at each meal with fruits and vegetables. ? Eat whole grains, such as whole wheat pasta, brown rice, or whole grain bread. Fill about one quarter of your plate with whole grains. ? Eat or drink low-fat dairy products, such as skim milk or low-fat yogurt. ? Avoid fatty cuts of meat, processed or cured meats, and poultry with skin. Fill about one quarter of your plate with lean proteins, such as fish, chicken without skin, beans, eggs, and tofu. ? Avoid premade and processed foods. These tend to be higher in sodium, added sugar, and fat.  Reduce your daily sodium intake. Most people with hypertension should eat less than  1,500 mg of sodium a day.  Limit alcohol intake to no more than 1 drink a day for nonpregnant women and 2 drinks a day for men. One drink equals 12 oz of beer, 5 oz of wine, or 1 oz of hard liquor. Lifestyle  Work with your health care provider to maintain a healthy body weight or to lose weight. Ask what an ideal weight is for you.  Get at least 30 minutes of exercise that causes your heart to beat faster (aerobic exercise) most days of the week. Activities may include walking, swimming, or biking.  Include exercise to strengthen your muscles (resistance exercise), such as pilates or lifting weights, as part of your weekly exercise routine. Try to do these types of exercises for 30 minutes at least 3 days a week.  Do not use any products that contain nicotine or tobacco, such as cigarettes and e-cigarettes. If you need help quitting, ask your health care provider.  Monitor your blood pressure at home as told by your health care provider.  Keep all follow-up visits as told by your health care provider. This is important. Medicines  Take over-the-counter and prescription medicines only as told by your health care provider. Follow directions carefully.  Blood pressure medicines must be taken as prescribed.  Do not skip doses of blood pressure medicine. Doing this puts you at risk for problems and can make the medicine less effective.  Ask your health care provider about side effects or reactions to medicines that you should watch for. Contact a health care provider if:  You think you are having a reaction to a medicine you are taking.  You have headaches that keep coming back (recurring).  You feel dizzy.  You have swelling in your ankles.  You have trouble with your vision. Get help right away if:  You develop a severe headache or confusion.  You have unusual weakness or numbness.  You feel faint.  You have severe pain in your chest or abdomen.  You vomit repeatedly.  You  have trouble breathing. Summary  Hypertension is when the force of blood pumping through your arteries is too strong. If this condition is not controlled, it may put you at risk for serious complications.  Your personal target blood pressure may vary depending on your medical conditions, your age, and other factors. For most people, a normal blood pressure is less than 120/80.  Hypertension is treated with lifestyle changes, medicines, or a combination of both. Lifestyle changes include weight loss, eating a healthy, low-sodium diet, exercising more, and limiting alcohol. This information is not intended to replace advice given to you by your health care provider. Make sure you discuss any questions you have with your health care provider. Document Released: 04/07/2005 Document Revised: 03/05/2016 Document Reviewed: 03/05/2016 Elsevier Interactive Patient Education  2018 [employer]      Edwina Barth, MD [facility]

## 2018-03-10 NOTE — Patient Instructions (Addendum)
   If you have lab work done today you will be contacted with your lab results within the next 2 weeks.  If you have not heard from us then please contact us. The fastest way to get your results is to register for My Chart.   IF you received an x-ray today, you will receive an invoice from [facility]. Please contact [facility] at [phone] with questions or concerns regarding your invoice.   IF you received labwork today, you will receive an invoice from [facility]. Please contact [facility] at [phone] with questions or concerns regarding your invoice.   Our billing staff will not be able to assist you with questions regarding bills from these companies.  You will be contacted with the lab results as soon as they are available. The fastest way to get your results is to activate your My Chart account. Instructions are located on the last page of this paperwork. If you have not heard from us regarding the results in 2 weeks, please contact this office.      How to Take Your Blood Pressure You can take your blood pressure at home with a machine. You may need to check your blood pressure at home:  To check if you have high blood pressure (hypertension).  To check your blood pressure over time.  To make sure your blood pressure medicine is working.  Supplies needed: You will need a blood pressure machine, or monitor. You can buy one at a drugstore or online. When choosing one:  Choose one with an arm cuff.  Choose one that wraps around your upper arm. Only one finger should fit between your arm and the cuff.  Do not choose one that measures your blood pressure from your wrist or finger.  Your doctor can suggest a monitor. How to prepare Avoid these things for 30 minutes before checking your blood pressure:  Drinking caffeine.  Drinking alcohol.  Eating.  Smoking.  Exercising.  Five minutes before checking your blood pressure:  Pee.  Sit in a  dining chair. Avoid sitting in a soft couch or armchair.  Be quiet. Do not talk.  How to take your blood pressure Follow the instructions that came with your machine. If you have a digital blood pressure monitor, these may be the instructions: 1. Sit up straight. 2. Place your feet on the floor. Do not cross your ankles or legs. 3. Rest your left arm at the level of your heart. You may rest it on a table, desk, or chair. 4. Pull up your shirt sleeve. 5. Wrap the blood pressure cuff around the upper part of your left arm. The cuff should be 1 inch (2.5 cm) above your elbow. It is best to wrap the cuff around bare skin. 6. Fit the cuff snugly around your arm. You should be able to place only one finger between the cuff and your arm. 7. Put the cord inside the groove of your elbow. 8. Press the power button. 9. Sit quietly while the cuff fills with air and loses air. 10. Write down the numbers on the screen. 11. Wait 2-3 minutes and then repeat steps 1-10.  What do the numbers mean? Two numbers make up your blood pressure. The first number is called systolic pressure. The second is called diastolic pressure. An example of a blood pressure reading is "120 over 80" (or 120/80). If you are an adult and do not have a medical condition, use this guide to find out   if your blood pressure is normal: Normal  First number: below 120.  Second number: below 80. Elevated  First number: 120-129.  Second number: below 80. Hypertension stage 1  First number: 130-139.  Second number: 80-89. Hypertension stage 2  First number: 140 or above.  Second number: 90 or above. Your blood pressure is above normal even if only the top or bottom number is above normal. Follow these instructions at home:  Check your blood pressure as often as your doctor tells you to.  Take your monitor to your next doctor's appointment. Your doctor will: ? Make sure you are using it correctly. ? Make sure it is  working right.  Make sure you understand what your blood pressure numbers should be.  Tell your doctor if your medicines are causing side effects. Contact a doctor if:  Your blood pressure keeps being high. Get help right away if:  Your first blood pressure number is higher than 180.  Your second blood pressure number is higher than 120. This information is not intended to replace advice given to you by your health care provider. Make sure you discuss any questions you have with your health care provider. Document Released: 03/20/2008 Document Revised: 03/05/2016 Document Reviewed: 09/14/2015 Elsevier Interactive Patient Education  2018 [employer]  Hypertension Hypertension, commonly called high blood pressure, is when the force of blood pumping through the arteries is too strong. The arteries are the blood vessels that carry blood from the heart throughout the body. Hypertension forces the heart to work harder to pump blood and may cause arteries to become narrow or stiff. Having untreated or uncontrolled hypertension can cause heart attacks, strokes, kidney disease, and other problems. A blood pressure reading consists of a higher number over a lower number. Ideally, your blood pressure should be below 120/80. The first ("top") number is called the systolic pressure. It is a measure of the pressure in your arteries as your heart beats. The second ("bottom") number is called the diastolic pressure. It is a measure of the pressure in your arteries as the heart relaxes. What are the causes? The cause of this condition is not known. What increases the risk? Some risk factors for high blood pressure are under your control. Others are not. Factors you can change  Smoking.  Having type 2 diabetes mellitus, high cholesterol, or both.  Not getting enough exercise or physical activity.  Being overweight.  Having too much fat, sugar, calories, or salt (sodium) in your diet.  Drinking  too much alcohol. Factors that are difficult or impossible to change  Having chronic kidney disease.  Having a family history of high blood pressure.  Age. Risk increases with age.  Race. You may be at higher risk if you are African-American.  Gender. Men are at higher risk than women before age 45. After age 65, women are at higher risk than men.  Having obstructive sleep apnea.  Stress. What are the signs or symptoms? Extremely high blood pressure (hypertensive crisis) may cause:  Headache.  Anxiety.  Shortness of breath.  Nosebleed.  Nausea and vomiting.  Severe chest pain.  Jerky movements you cannot control (seizures).  How is this diagnosed? This condition is diagnosed by measuring your blood pressure while you are seated, with your arm resting on a surface. The cuff of the blood pressure monitor will be placed directly against the skin of your upper arm at the level of your heart. It should be measured at least twice using the   same arm. Certain conditions can cause a difference in blood pressure between your right and left arms. Certain factors can cause blood pressure readings to be lower or higher than normal (elevated) for a short period of time:  When your blood pressure is higher when you are in a health care provider's office than when you are at home, this is called white coat hypertension. Most people with this condition do not need medicines.  When your blood pressure is higher at home than when you are in a health care provider's office, this is called masked hypertension. Most people with this condition may need medicines to control blood pressure.  If you have a high blood pressure reading during one visit or you have normal blood pressure with other risk factors:  You may be asked to return on a different day to have your blood pressure checked again.  You may be asked to monitor your blood pressure at home for 1 week or longer.  If you are diagnosed  with hypertension, you may have other blood or imaging tests to help your health care provider understand your overall risk for other conditions. How is this treated? This condition is treated by making healthy lifestyle changes, such as eating healthy foods, exercising more, and reducing your alcohol intake. Your health care provider may prescribe medicine if lifestyle changes are not enough to get your blood pressure under control, and if:  Your systolic blood pressure is above 130.  Your diastolic blood pressure is above 80.  Your personal target blood pressure may vary depending on your medical conditions, your age, and other factors. Follow these instructions at home: Eating and drinking  Eat a diet that is high in fiber and potassium, and low in sodium, added sugar, and fat. An example eating plan is called the DASH (Dietary Approaches to Stop Hypertension) diet. To eat this way: ? Eat plenty of fresh fruits and vegetables. Try to fill half of your plate at each meal with fruits and vegetables. ? Eat whole grains, such as whole wheat pasta, brown rice, or whole grain bread. Fill about one quarter of your plate with whole grains. ? Eat or drink low-fat dairy products, such as skim milk or low-fat yogurt. ? Avoid fatty cuts of meat, processed or cured meats, and poultry with skin. Fill about one quarter of your plate with lean proteins, such as fish, chicken without skin, beans, eggs, and tofu. ? Avoid premade and processed foods. These tend to be higher in sodium, added sugar, and fat.  Reduce your daily sodium intake. Most people with hypertension should eat less than 1,500 mg of sodium a day.  Limit alcohol intake to no more than 1 drink a day for nonpregnant women and 2 drinks a day for men. One drink equals 12 oz of beer, 5 oz of wine, or 1 oz of hard liquor. Lifestyle  Work with your health care provider to maintain a healthy body weight or to lose weight. Ask what an ideal weight  is for you.  Get at least 30 minutes of exercise that causes your heart to beat faster (aerobic exercise) most days of the week. Activities may include walking, swimming, or biking.  Include exercise to strengthen your muscles (resistance exercise), such as pilates or lifting weights, as part of your weekly exercise routine. Try to do these types of exercises for 30 minutes at least 3 days a week.  Do not use any products that contain nicotine or tobacco, such   as cigarettes and e-cigarettes. If you need help quitting, ask your health care provider.  Monitor your blood pressure at home as told by your health care provider.  Keep all follow-up visits as told by your health care provider. This is important. Medicines  Take over-the-counter and prescription medicines only as told by your health care provider. Follow directions carefully. Blood pressure medicines must be taken as prescribed.  Do not skip doses of blood pressure medicine. Doing this puts you at risk for problems and can make the medicine less effective.  Ask your health care provider about side effects or reactions to medicines that you should watch for. Contact a health care provider if:  You think you are having a reaction to a medicine you are taking.  You have headaches that keep coming back (recurring).  You feel dizzy.  You have swelling in your ankles.  You have trouble with your vision. Get help right away if:  You develop a severe headache or confusion.  You have unusual weakness or numbness.  You feel faint.  You have severe pain in your chest or abdomen.  You vomit repeatedly.  You have trouble breathing. Summary  Hypertension is when the force of blood pumping through your arteries is too strong. If this condition is not controlled, it may put you at risk for serious complications.  Your personal target blood pressure may vary depending on your medical conditions, your age, and other factors. For  most people, a normal blood pressure is less than 120/80.  Hypertension is treated with lifestyle changes, medicines, or a combination of both. Lifestyle changes include weight loss, eating a healthy, low-sodium diet, exercising more, and limiting alcohol. This information is not intended to replace advice given to you by your health care provider. Make sure you discuss any questions you have with your health care provider. Document Released: 04/07/2005 Document Revised: 03/05/2016 Document Reviewed: 03/05/2016 Elsevier Interactive Patient Education  2018 [employer]  

## 2018-03-11 ENCOUNTER — Encounter: Payer: Self-pay | Admitting: *Deleted

## 2018-03-11 ENCOUNTER — Other Ambulatory Visit: Payer: Self-pay | Admitting: Emergency Medicine

## 2018-03-11 LAB — CBC WITH DIFFERENTIAL/PLATELET
Basophils Absolute: 0.1 10*3/uL (ref 0.0–0.2)
Basos: 1 %
EOS (ABSOLUTE): 0.3 10*3/uL (ref 0.0–0.4)
Eos: 5 %
Hematocrit: 46.8 % — ABNORMAL HIGH (ref 34.0–46.6)
Hemoglobin: 16.4 g/dL — ABNORMAL HIGH (ref 11.1–15.9)
Immature Grans (Abs): 0 10*3/uL (ref 0.0–0.1)
Immature Granulocytes: 0 %
Lymphocytes Absolute: 1.4 10*3/uL (ref 0.7–3.1)
Lymphs: 28 %
MCH: 30.1 pg (ref 26.6–33.0)
MCHC: 35 g/dL (ref 31.5–35.7)
MCV: 86 fL (ref 79–97)
Monocytes Absolute: 0.5 10*3/uL (ref 0.1–0.9)
Monocytes: 9 %
Neutrophils Absolute: 2.9 10*3/uL (ref 1.4–7.0)
Neutrophils: 57 %
Platelets: 259 10*3/uL (ref 150–450)
RBC: 5.45 x10E6/uL — ABNORMAL HIGH (ref 3.77–5.28)
RDW: 12.4 % (ref 12.3–15.4)
WBC: 5.1 10*3/uL (ref 3.4–10.8)

## 2018-03-11 LAB — HEPATITIS C ANTIBODY: Hep C Virus Ab: <0.1 s/co ratio (ref 0.0–0.9)

## 2018-03-11 LAB — LIPID PANEL
Chol/HDL Ratio: 4.2 ratio (ref 0.0–4.4)
Cholesterol, Total: 300 mg/dL — ABNORMAL HIGH (ref 100–199)
HDL: 72 mg/dL (ref >39)
LDL Calculated: 206 mg/dL — ABNORMAL HIGH (ref 0–99)
Triglycerides: 112 mg/dL (ref 0–149)
VLDL Cholesterol Cal: 22 mg/dL (ref 5–40)

## 2018-03-11 LAB — HEMOGLOBIN A1C
Est. average glucose Bld gHb Est-mCnc: 114 mg/dL
Hgb A1c MFr Bld: 5.6 % (ref 4.8–5.6)

## 2018-03-11 LAB — COMPREHENSIVE METABOLIC PANEL
ALT: 10 IU/L (ref 0–32)
AST: 16 IU/L (ref 0–40)
Albumin/Globulin Ratio: 1.4 (ref 1.2–2.2)
Albumin: 4.5 g/dL (ref 3.5–5.5)
Alkaline Phosphatase: 69 IU/L (ref 39–117)
BUN/Creatinine Ratio: 13 (ref 9–23)
BUN: 12 mg/dL (ref 6–24)
Bilirubin Total: 0.3 mg/dL (ref 0.0–1.2)
CO2: 25 mmol/L (ref 20–29)
Calcium: 10 mg/dL (ref 8.7–10.2)
Chloride: 99 mmol/L (ref 96–106)
Creatinine, Ser: 0.95 mg/dL (ref 0.57–1.00)
GFR calc Af Amer: 79 mL/min/{1.73_m2} (ref >59)
GFR calc non Af Amer: 68 mL/min/{1.73_m2} (ref >59)
Globulin, Total: 3.3 g/dL (ref 1.5–4.5)
Glucose: 76 mg/dL (ref 65–99)
Potassium: 4.1 mmol/L (ref 3.5–5.2)
Sodium: 139 mmol/L (ref 134–144)
Total Protein: 7.8 g/dL (ref 6.0–8.5)

## 2018-03-11 MED ORDER — ROSUVASTATIN CALCIUM 10 MG PO TABS: 10.0000 mg | ORAL_TABLET | Freq: Every day | ORAL | 3 refills | Status: DC

## 2018-04-08 ENCOUNTER — Encounter: Payer: Self-pay | Admitting: Emergency Medicine

## 2018-04-08 ENCOUNTER — Other Ambulatory Visit: Payer: Self-pay | Admitting: Emergency Medicine

## 2018-04-08 ENCOUNTER — Ambulatory Visit (INDEPENDENT_AMBULATORY_CARE_PROVIDER_SITE_OTHER): Payer: 59 | Admitting: Emergency Medicine

## 2018-04-08 ENCOUNTER — Other Ambulatory Visit: Payer: Self-pay

## 2018-04-08 VITALS — BP 170/110 | HR 90 | Temp 97.8°F | Resp 16 | Ht 60.5 in | Wt 114.6 lb

## 2018-04-08 DIAGNOSIS — I10 Essential (primary) hypertension: Secondary | ICD-10-CM

## 2018-04-08 DIAGNOSIS — E785 Hyperlipidemia, unspecified: Secondary | ICD-10-CM | POA: Diagnosis not present

## 2018-04-08 MED ORDER — ROSUVASTATIN CALCIUM 10 MG PO TABS: 10.0000 mg | ORAL_TABLET | Freq: Every day | ORAL | 3 refills | Status: DC

## 2018-04-08 MED ORDER — LISINOPRIL-HYDROCHLOROTHIAZIDE 20-12.5 MG PO TABS: 1.0000 | ORAL_TABLET | Freq: Every day | ORAL | 3 refills | Status: DC

## 2018-04-08 MED ORDER — AMLODIPINE BESYLATE 10 MG PO TABS: 10.0000 mg | ORAL_TABLET | Freq: Every day | ORAL | 3 refills | Status: DC

## 2018-04-08 NOTE — Progress Notes (Signed)
BP Readings from Last 3 Encounters:  04/08/18 (!) 247/124  03/10/18 (!) 200/120  10/28/13 (!) 246/129   Kathleen Cameron 52 y.o.   Chief Complaint  Patient presents with  . Hypertension    follow up    HISTORY OF PRESENT ILLNESS: This is a 52 y.o. female with history of hypertension here for follow-up.  Compliant with medication.  Takes it in the morning.  Taking amlodipine 5 mg and lisinopril 20 mg daily.  Has not started Crestor yet.  Asymptomatic.  Has no complaints today or medical concerns.  HPI   Prior to Admission medications   Medication Sig Start Date End Date Taking? Authorizing Provider  amLODipine (NORVASC) 5 MG tablet Take 1 tablet (5 mg total) by mouth daily. 03/10/18  Yes Goran Olden, Eilleen Kempf, MD  lisinopril (PRINIVIL,ZESTRIL) 20 MG tablet Take 1 tablet (20 mg total) by mouth daily. 03/10/18  Yes Vincentina Sollers, Eilleen Kempf, MD  rosuvastatin (CRESTOR) 10 MG tablet Take 1 tablet (10 mg total) by mouth daily. Patient not taking: Reported on 04/08/2018 03/11/18   [name], MD    No Known Allergies  Patient Active Problem List   Diagnosis Date Noted  . Uncontrolled hypertension 03/10/2018    Past Medical History:  Diagnosis Date  . Hypertension     No past surgical history on file.  Social History   Socioeconomic History  . Marital status: Single    Spouse name: Not on file  . Number of children: Not on file  . Years of education: Not on file  . Highest education level: Not on file  Occupational History  . Not on file  Social Needs  . Financial resource strain: Not on file  . Food insecurity:    Worry: Not on file    Inability: Not on file  . Transportation needs:    Medical: Not on file    Non-medical: Not on file  Tobacco Use  . Smoking status: Never Smoker  . Smokeless tobacco: Never Used  Substance and Sexual Activity  . Alcohol use: Yes    Alcohol/week: 2.0 standard drinks    Types: 2 Cans of beer per week  . Drug use:  Never  . Sexual activity: Not on file  Lifestyle  . Physical activity:    Days per week: Not on file    Minutes per session: Not on file  . Stress: Not on file  Relationships  . Social connections:    Talks on phone: Not on file    Gets together: Not on file    Attends religious service: Not on file    Active member of club or organization: Not on file    Attends meetings of clubs or organizations: Not on file    Relationship status: Not on file  . Intimate partner violence:    Fear of current or ex partner: Not on file    Emotionally abused: Not on file    Physically abused: Not on file    Forced sexual activity: Not on file  Other Topics Concern  . Not on file  Social History Narrative  . Not on file    No family history on file.   Review of Systems  Constitutional: Negative.  Negative for chills and fever.  HENT: Negative.  Negative for hearing loss.   Eyes: Negative.  Negative for blurred vision and double vision.  Respiratory: Negative.  Negative for cough and shortness of breath.   Cardiovascular: Negative.  Negative for chest  pain, palpitations, claudication and leg swelling.  Gastrointestinal: Negative.  Negative for abdominal pain, nausea and vomiting.  Genitourinary: Negative.   Skin: Negative.  Negative for rash.  Neurological: Negative.  Negative for dizziness, sensory change, speech change, weakness and headaches.  Endo/Heme/Allergies: Negative.   All other systems reviewed and are negative.    Physical Exam Vitals signs reviewed.  Constitutional:      Appearance: Normal appearance.  HENT:     Head: Normocephalic and atraumatic.     Nose: Nose normal.  Eyes:     Extraocular Movements: Extraocular movements intact.     Conjunctiva/sclera: Conjunctivae normal.     Pupils: Pupils are equal, round, and reactive to light.  Neck:     Musculoskeletal: Normal range of motion.  Cardiovascular:     Rate and Rhythm: Normal rate and regular rhythm.      Pulses: Normal pulses.     Heart sounds: Normal heart sounds.  Pulmonary:     Effort: Pulmonary effort is normal.     Breath sounds: Normal breath sounds.  Musculoskeletal: Normal range of motion.  Skin:    General: Skin is warm and dry.     Capillary Refill: Capillary refill takes less than 2 seconds.  Neurological:     General: No focal deficit present.     Mental Status: She is alert and oriented to person, place, and time.     Sensory: No sensory deficit.     Coordination: Coordination normal.     Gait: Gait normal.  Psychiatric:        Mood and Affect: Mood normal.        Behavior: Behavior normal.    A total of 25 minutes was spent in the room with the patient, greater than 50% of which was in counseling/coordination of care regarding hypertension, treatment, change of medications doses, prognosis and need for follow-up in 4 weeks.   ASSESSMENT & PLAN: Uncontrolled hypertension Blood pressure still uncontrolled.  Will increase amlodipine to 10 mg a day.  Will add HCTZ to lisinopril.  Start Crestor as prescribed last time.  Advised to monitor blood pressure at home.  Follow-up in 3 months.  Zionah was seen today for hypertension.  Diagnoses and all orders for this visit:  Uncontrolled hypertension -     rosuvastatin (CRESTOR) 10 MG tablet; Take 1 tablet (10 mg total) by mouth daily. -     amLODipine (NORVASC) 10 MG tablet; Take 1 tablet (10 mg total) by mouth daily. -     lisinopril-hydrochlorothiazide (ZESTORETIC) 20-12.5 MG tablet; Take 1 tablet by mouth daily.  Dyslipidemia     Patient Instructions       If you have lab work done today you will be contacted with your lab results within the next 2 weeks.  If you have not heard from [place] then please contact us. The fastest way to get your results is to register for My Chart.   IF you received an x-ray today, you will receive an invoice from [facility]. Please contact [facility] at  [phone] with questions or concerns regarding your invoice.   IF you received labwork today, you will receive an invoice from [facility]. Please contact [facility] at [phone] with questions or concerns regarding your invoice.   Our billing staff will not be able to assist you with questions regarding bills from these companies.  You will be contacted with the lab results as soon as they are available. The fastest way to get your results  is to activate your My Chart account. Instructions are located on the last page of this paperwork. If you have not heard from us regarding the results in 2 weeks, please contact this office.     How to Take Your Blood Pressure You can take your blood pressure at home with a machine. You may need to check your blood pressure at home:  To check if you have high blood pressure (hypertension).  To check your blood pressure over time.  To make sure your blood pressure medicine is working. Supplies needed: You will need a blood pressure machine, or monitor. You can buy one at a drugstore or online. When choosing one:  Choose one with an arm cuff.  Choose one that wraps around your upper arm. Only one finger should fit between your arm and the cuff.  Do not choose one that measures your blood pressure from your wrist or finger. Your doctor can suggest a monitor. How to prepare Avoid these things for 30 minutes before checking your blood pressure:  Drinking caffeine.  Drinking alcohol.  Eating.  Smoking.  Exercising. Five minutes before checking your blood pressure:  Pee.  Sit in a dining chair. Avoid sitting in a soft couch or armchair.  Be quiet. Do not talk. How to take your blood pressure Follow the instructions that came with your machine. If you have a digital blood pressure monitor, these may be the instructions: 1. Sit up straight. 2. Place your feet on the floor. Do not cross your ankles or legs. 3. Rest your left arm at the  level of your heart. You may rest it on a table, desk, or chair. 4. Pull up your shirt sleeve. 5. Wrap the blood pressure cuff around the upper part of your left arm. The cuff should be 1 inch (2.5 cm) above your elbow. It is best to wrap the cuff around bare skin. 6. Fit the cuff snugly around your arm. You should be able to place only one finger between the cuff and your arm. 7. Put the cord inside the groove of your elbow. 8. Press the power button. 9. Sit quietly while the cuff fills with air and loses air. 10. Write down the numbers on the screen. 11. Wait 2-3 minutes and then repeat steps 1-10. What do the numbers mean? Two numbers make up your blood pressure. The first number is called systolic pressure. The second is called diastolic pressure. An example of a blood pressure reading is "120 over 80" (or 120/80). If you are an adult and do not have a medical condition, use this guide to find out if your blood pressure is normal: Normal  First number: below 120.  Second number: below 80. Elevated  First number: 120-129.  Second number: below 80. Hypertension stage 1  First number: 130-139.  Second number: 80-89. Hypertension stage 2  First number: 140 or above.  Second number: 90 or above. Your blood pressure is above normal even if only the top or bottom number is above normal. Follow these instructions at home:  Check your blood pressure as often as your doctor tells you to.  Take your monitor to your next doctor's appointment. Your doctor will: ? Make sure you are using it correctly. ? Make sure it is working right.  Make sure you understand what your blood pressure numbers should be.  Tell your doctor if your medicines are causing side effects. Contact a doctor if:  Your blood pressure keeps being high. Get  help right away if:  Your first blood pressure number is higher than 180.  Your second blood pressure number is higher than 120. This information is  not intended to replace advice given to you by your health care provider. Make sure you discuss any questions you have with your health care provider. Document Released: 03/20/2008 Document Revised: 03/05/2016 Document Reviewed: 09/14/2015 Elsevier Interactive Patient Education  2019 [employer]  Hypertension Hypertension, commonly called high blood pressure, is when the force of blood pumping through the arteries is too strong. The arteries are the blood vessels that carry blood from the heart throughout the body. Hypertension forces the heart to work harder to pump blood and may cause arteries to become narrow or stiff. Having untreated or uncontrolled hypertension can cause heart attacks, strokes, kidney disease, and other problems. A blood pressure reading consists of a higher number over a lower number. Ideally, your blood pressure should be below 120/80. The first ("top") number is called the systolic pressure. It is a measure of the pressure in your arteries as your heart beats. The second ("bottom") number is called the diastolic pressure. It is a measure of the pressure in your arteries as the heart relaxes. What are the causes? The cause of this condition is not known. What increases the risk? Some risk factors for high blood pressure are under your control. Others are not. Factors you can change  Smoking.  Having type 2 diabetes mellitus, high cholesterol, or both.  Not getting enough exercise or physical activity.  Being overweight.  Having too much fat, sugar, calories, or salt (sodium) in your diet.  Drinking too much alcohol. Factors that are difficult or impossible to change  Having chronic kidney disease.  Having a family history of high blood pressure.  Age. Risk increases with age.  Race. You may be at higher risk if you are African-American.  Gender. Men are at higher risk than women before age 54. After age 765, women are at higher risk than men.  Having  obstructive sleep apnea.  Stress. What are the signs or symptoms? Extremely high blood pressure (hypertensive crisis) may cause:  Headache.  Anxiety.  Shortness of breath.  Nosebleed.  Nausea and vomiting.  Severe chest pain.  Jerky movements you cannot control (seizures). How is this diagnosed? This condition is diagnosed by measuring your blood pressure while you are seated, with your arm resting on a surface. The cuff of the blood pressure monitor will be placed directly against the skin of your upper arm at the level of your heart. It should be measured at least twice using the same arm. Certain conditions can cause a difference in blood pressure between your right and left arms. Certain factors can cause blood pressure readings to be lower or higher than normal (elevated) for a short period of time:  When your blood pressure is higher when you are in a health care provider's office than when you are at home, this is called white coat hypertension. Most people with this condition do not need medicines.  When your blood pressure is higher at home than when you are in a health care provider's office, this is called masked hypertension. Most people with this condition may need medicines to control blood pressure. If you have a high blood pressure reading during one visit or you have normal blood pressure with other risk factors:  You may be asked to return on a different day to have your blood pressure checked again.  You may be asked to monitor your blood pressure at home for 1 week or longer. If you are diagnosed with hypertension, you may have other blood or imaging tests to help your health care provider understand your overall risk for other conditions. How is this treated? This condition is treated by making healthy lifestyle changes, such as eating healthy foods, exercising more, and reducing your alcohol intake. Your health care provider may prescribe medicine if lifestyle  changes are not enough to get your blood pressure under control, and if:  Your systolic blood pressure is above 130.  Your diastolic blood pressure is above 80. Your personal target blood pressure may vary depending on your medical conditions, your age, and other factors. Follow these instructions at home: Eating and drinking   Eat a diet that is high in fiber and potassium, and low in sodium, added sugar, and fat. An example eating plan is called the DASH (Dietary Approaches to Stop Hypertension) diet. To eat this way: ? Eat plenty of fresh fruits and vegetables. Try to fill half of your plate at each meal with fruits and vegetables. ? Eat whole grains, such as whole wheat pasta, brown rice, or whole grain bread. Fill about one quarter of your plate with whole grains. ? Eat or drink low-fat dairy products, such as skim milk or low-fat yogurt. ? Avoid fatty cuts of meat, processed or cured meats, and poultry with skin. Fill about one quarter of your plate with lean proteins, such as fish, chicken without skin, beans, eggs, and tofu. ? Avoid premade and processed foods. These tend to be higher in sodium, added sugar, and fat.  Reduce your daily sodium intake. Most people with hypertension should eat less than 1,500 mg of sodium a day.  Limit alcohol intake to no more than 1 drink a day for nonpregnant women and 2 drinks a day for men. One drink equals 12 oz of beer, 5 oz of wine, or 1 oz of hard liquor. Lifestyle   Work with your health care provider to maintain a healthy body weight or to lose weight. Ask what an ideal weight is for you.  Get at least 30 minutes of exercise that causes your heart to beat faster (aerobic exercise) most days of the week. Activities may include walking, swimming, or biking.  Include exercise to strengthen your muscles (resistance exercise), such as pilates or lifting weights, as part of your weekly exercise routine. Try to do these types of exercises for 30  minutes at least 3 days a week.  Do not use any products that contain nicotine or tobacco, such as cigarettes and e-cigarettes. If you need help quitting, ask your health care provider.  Monitor your blood pressure at home as told by your health care provider.  Keep all follow-up visits as told by your health care provider. This is important. Medicines  Take over-the-counter and prescription medicines only as told by your health care provider. Follow directions carefully. Blood pressure medicines must be taken as prescribed.  Do not skip doses of blood pressure medicine. Doing this puts you at risk for problems and can make the medicine less effective.  Ask your health care provider about side effects or reactions to medicines that you should watch for. Contact a health care provider if:  You think you are having a reaction to a medicine you are taking.  You have headaches that keep coming back (recurring).  You feel dizzy.  You have swelling in your ankles.  You have trouble with your vision. Get help right away if:  You develop a severe headache or confusion.  You have unusual weakness or numbness.  You feel faint.  You have severe pain in your chest or abdomen.  You vomit repeatedly.  You have trouble breathing. Summary  Hypertension is when the force of blood pumping through your arteries is too strong. If this condition is not controlled, it may put you at risk for serious complications.  Your personal target blood pressure may vary depending on your medical conditions, your age, and other factors. For most people, a normal blood pressure is less than 120/80.  Hypertension is treated with lifestyle changes, medicines, or a combination of both. Lifestyle changes include weight loss, eating a healthy, low-sodium diet, exercising more, and limiting alcohol. This information is not intended to replace advice given to you by your health care provider. Make sure you  discuss any questions you have with your health care provider. Document Released: 04/07/2005 Document Revised: 03/05/2016 Document Reviewed: 03/05/2016 Elsevier Interactive Patient Education  2019 [employer]vier Inc.      Edwina Barth, MD [facility]

## 2018-04-08 NOTE — Telephone Encounter (Signed)
Copied from CRM [id number]. Topic: Quick Communication - Rx Refill/Question >> Apr 08, 2018  5:09 PM Tamela OddiHarris, Zahli Vetsch J wrote: Medication: rosuvastatin (CRESTOR) 10 MG tablet  Patient called to request a refill for the above medication  Preferred Pharmacy (with phone number or street name): [id number] Ginette Otto- [place], [place] - [address] AT [facility] [address] [phone] (Phone) [phone] (Fax)

## 2018-04-08 NOTE — Assessment & Plan Note (Signed)
Blood pressure still uncontrolled.  Will increase amlodipine to 10 mg a day.  Will add HCTZ to lisinopril.  Start Crestor as prescribed last time.  Advised to monitor blood pressure at home.  Follow-up in 3 months.

## 2018-04-08 NOTE — Patient Instructions (Addendum)
If you have lab work done today you will be contacted with your lab results within the next 2 weeks.  If you have not heard from [place] then please contact us. The fastest way to get your results is to register for My Chart.   IF you received an x-ray today, you will receive an invoice from [facility]. Please contact [facility] at [phone] with questions or concerns regarding your invoice.   IF you received labwork today, you will receive an invoice from [facility]. Please contact [facility] at [phone] with questions or concerns regarding your invoice.   Our billing staff will not be able to assist you with questions regarding bills from these companies.  You will be contacted with the lab results as soon as they are available. The fastest way to get your results is to activate your My Chart account. Instructions are located on the last page of this paperwork. If you have not heard from [place] regarding the results in 2 weeks, please contact this office.     How to Take Your Blood Pressure You can take your blood pressure at home with a machine. You may need to check your blood pressure at home:  To check if you have high blood pressure (hypertension).  To check your blood pressure over time.  To make sure your blood pressure medicine is working. Supplies needed: You will need a blood pressure machine, or monitor. You can buy one at a drugstore or online. When choosing one:  Choose one with an arm cuff.  Choose one that wraps around your upper arm. Only one finger should fit between your arm and the cuff.  Do not choose one that measures your blood pressure from your wrist or finger. Your doctor can suggest a monitor. How to prepare Avoid these things for 30 minutes before checking your blood pressure:  Drinking caffeine.  Drinking alcohol.  Eating.  Smoking.  Exercising. Five minutes before checking your blood pressure:  Pee.  Sit in a dining  chair. Avoid sitting in a soft couch or armchair.  Be quiet. Do not talk. How to take your blood pressure Follow the instructions that came with your machine. If you have a digital blood pressure monitor, these may be the instructions: 1. Sit up straight. 2. Place your feet on the floor. Do not cross your ankles or legs. 3. Rest your left arm at the level of your heart. You may rest it on a table, desk, or chair. 4. Pull up your shirt sleeve. 5. Wrap the blood pressure cuff around the upper part of your left arm. The cuff should be 1 inch (2.5 cm) above your elbow. It is best to wrap the cuff around bare skin. 6. Fit the cuff snugly around your arm. You should be able to place only one finger between the cuff and your arm. 7. Put the cord inside the groove of your elbow. 8. Press the power button. 9. Sit quietly while the cuff fills with air and loses air. 10. Write down the numbers on the screen. 11. Wait 2-3 minutes and then repeat steps 1-10. What do the numbers mean? Two numbers make up your blood pressure. The first number is called systolic pressure. The second is called diastolic pressure. An example of a blood pressure reading is "120 over 80" (or 120/80). If you are an adult and do not have a medical condition, use this guide to find out if your blood pressure is normal:  Normal  First number: below 120.  Second number: below 80. Elevated  First number: 120-129.  Second number: below 80. Hypertension stage 1  First number: 130-139.  Second number: 80-89. Hypertension stage 2  First number: 140 or above.  Second number: 90 or above. Your blood pressure is above normal even if only the top or bottom number is above normal. Follow these instructions at home:  Check your blood pressure as often as your doctor tells you to.  Take your monitor to your next doctor's appointment. Your doctor will: ? Make sure you are using it correctly. ? Make sure it is working  right.  Make sure you understand what your blood pressure numbers should be.  Tell your doctor if your medicines are causing side effects. Contact a doctor if:  Your blood pressure keeps being high. Get help right away if:  Your first blood pressure number is higher than 180.  Your second blood pressure number is higher than 120. This information is not intended to replace advice given to you by your health care provider. Make sure you discuss any questions you have with your health care provider. Document Released: 03/20/2008 Document Revised: 03/05/2016 Document Reviewed: 09/14/2015 Elsevier Interactive Patient Education  2019 [employer]  Hypertension Hypertension, commonly called high blood pressure, is when the force of blood pumping through the arteries is too strong. The arteries are the blood vessels that carry blood from the heart throughout the body. Hypertension forces the heart to work harder to pump blood and may cause arteries to become narrow or stiff. Having untreated or uncontrolled hypertension can cause heart attacks, strokes, kidney disease, and other problems. A blood pressure reading consists of a higher number over a lower number. Ideally, your blood pressure should be below 120/80. The first ("top") number is called the systolic pressure. It is a measure of the pressure in your arteries as your heart beats. The second ("bottom") number is called the diastolic pressure. It is a measure of the pressure in your arteries as the heart relaxes. What are the causes? The cause of this condition is not known. What increases the risk? Some risk factors for high blood pressure are under your control. Others are not. Factors you can change  Smoking.  Having type 2 diabetes mellitus, high cholesterol, or both.  Not getting enough exercise or physical activity.  Being overweight.  Having too much fat, sugar, calories, or salt (sodium) in your diet.  Drinking too much  alcohol. Factors that are difficult or impossible to change  Having chronic kidney disease.  Having a family history of high blood pressure.  Age. Risk increases with age.  Race. You may be at higher risk if you are African-American.  Gender. Men are at higher risk than women before age 54. After age 54, women are at higher risk than men.  Having obstructive sleep apnea.  Stress. What are the signs or symptoms? Extremely high blood pressure (hypertensive crisis) may cause:  Headache.  Anxiety.  Shortness of breath.  Nosebleed.  Nausea and vomiting.  Severe chest pain.  Jerky movements you cannot control (seizures). How is this diagnosed? This condition is diagnosed by measuring your blood pressure while you are seated, with your arm resting on a surface. The cuff of the blood pressure monitor will be placed directly against the skin of your upper arm at the level of your heart. It should be measured at least twice using the same arm. Certain conditions can cause a  difference in blood pressure between your right and left arms. Certain factors can cause blood pressure readings to be lower or higher than normal (elevated) for a short period of time:  When your blood pressure is higher when you are in a health care provider's office than when you are at home, this is called white coat hypertension. Most people with this condition do not need medicines.  When your blood pressure is higher at home than when you are in a health care provider's office, this is called masked hypertension. Most people with this condition may need medicines to control blood pressure. If you have a high blood pressure reading during one visit or you have normal blood pressure with other risk factors:  You may be asked to return on a different day to have your blood pressure checked again.  You may be asked to monitor your blood pressure at home for 1 week or longer. If you are diagnosed with  hypertension, you may have other blood or imaging tests to help your health care provider understand your overall risk for other conditions. How is this treated? This condition is treated by making healthy lifestyle changes, such as eating healthy foods, exercising more, and reducing your alcohol intake. Your health care provider may prescribe medicine if lifestyle changes are not enough to get your blood pressure under control, and if:  Your systolic blood pressure is above 130.  Your diastolic blood pressure is above 80. Your personal target blood pressure may vary depending on your medical conditions, your age, and other factors. Follow these instructions at home: Eating and drinking   Eat a diet that is high in fiber and potassium, and low in sodium, added sugar, and fat. An example eating plan is called the DASH (Dietary Approaches to Stop Hypertension) diet. To eat this way: ? Eat plenty of fresh fruits and vegetables. Try to fill half of your plate at each meal with fruits and vegetables. ? Eat whole grains, such as whole wheat pasta, brown rice, or whole grain bread. Fill about one quarter of your plate with whole grains. ? Eat or drink low-fat dairy products, such as skim milk or low-fat yogurt. ? Avoid fatty cuts of meat, processed or cured meats, and poultry with skin. Fill about one quarter of your plate with lean proteins, such as fish, chicken without skin, beans, eggs, and tofu. ? Avoid premade and processed foods. These tend to be higher in sodium, added sugar, and fat.  Reduce your daily sodium intake. Most people with hypertension should eat less than 1,500 mg of sodium a day.  Limit alcohol intake to no more than 1 drink a day for nonpregnant women and 2 drinks a day for men. One drink equals 12 oz of beer, 5 oz of wine, or 1 oz of hard liquor. Lifestyle   Work with your health care provider to maintain a healthy body weight or to lose weight. Ask what an ideal weight is  for you.  Get at least 30 minutes of exercise that causes your heart to beat faster (aerobic exercise) most days of the week. Activities may include walking, swimming, or biking.  Include exercise to strengthen your muscles (resistance exercise), such as pilates or lifting weights, as part of your weekly exercise routine. Try to do these types of exercises for 30 minutes at least 3 days a week.  Do not use any products that contain nicotine or tobacco, such as cigarettes and e-cigarettes. If you need help  quitting, ask your health care provider.  Monitor your blood pressure at home as told by your health care provider.  Keep all follow-up visits as told by your health care provider. This is important. Medicines  Take over-the-counter and prescription medicines only as told by your health care provider. Follow directions carefully. Blood pressure medicines must be taken as prescribed.  Do not skip doses of blood pressure medicine. Doing this puts you at risk for problems and can make the medicine less effective.  Ask your health care provider about side effects or reactions to medicines that you should watch for. Contact a health care provider if:  You think you are having a reaction to a medicine you are taking.  You have headaches that keep coming back (recurring).  You feel dizzy.  You have swelling in your ankles.  You have trouble with your vision. Get help right away if:  You develop a severe headache or confusion.  You have unusual weakness or numbness.  You feel faint.  You have severe pain in your chest or abdomen.  You vomit repeatedly.  You have trouble breathing. Summary  Hypertension is when the force of blood pumping through your arteries is too strong. If this condition is not controlled, it may put you at risk for serious complications.  Your personal target blood pressure may vary depending on your medical conditions, your age, and other factors. For most  people, a normal blood pressure is less than 120/80.  Hypertension is treated with lifestyle changes, medicines, or a combination of both. Lifestyle changes include weight loss, eating a healthy, low-sodium diet, exercising more, and limiting alcohol. This information is not intended to replace advice given to you by your health care provider. Make sure you discuss any questions you have with your health care provider. Document Released: 04/07/2005 Document Revised: 03/05/2016 Document Reviewed: 03/05/2016 Elsevier Interactive Patient Education  2019 [employer].

## 2018-05-05 ENCOUNTER — Telehealth: Payer: Self-pay | Admitting: Emergency Medicine

## 2018-05-05 NOTE — Telephone Encounter (Signed)
CALLED PATIENT TO INFORM HER [facility] HAD BEEN TRYING TO CONTACT HER SHE DOES NOT WANT THE REFERRAL 05/05/18

## 2018-05-06 ENCOUNTER — Encounter: Payer: Self-pay | Admitting: Emergency Medicine

## 2018-05-06 ENCOUNTER — Other Ambulatory Visit: Payer: Self-pay

## 2018-05-06 ENCOUNTER — Ambulatory Visit (INDEPENDENT_AMBULATORY_CARE_PROVIDER_SITE_OTHER): Payer: 59 | Admitting: Emergency Medicine

## 2018-05-06 DIAGNOSIS — I10 Essential (primary) hypertension: Secondary | ICD-10-CM | POA: Diagnosis not present

## 2018-05-06 MED ORDER — ALBUTEROL SULFATE HFA 108 (90 BASE) MCG/ACT IN AERS: 2.0000 | INHALATION_SPRAY | Freq: Four times a day (QID) | RESPIRATORY_TRACT | 0 refills | Status: DC | PRN

## 2018-05-06 NOTE — Patient Instructions (Addendum)
Increase lisinopril-hydrochlorothiazide 20/12.5 to twice a day.  Continue amlodipine 10 mg daily.  Monitor blood pressure at home and bring back readings for evaluation.  Follow-up in 1 month.   If you have lab work done today you will be contacted with your lab results within the next 2 weeks.  If you have not heard from us then please contact us. The fastest way to get your results is to register for My Chart.   IF you received an x-ray today, you will receive an invoice from [facility]. Please contact [facility] at [phone] with questions or concerns regarding your invoice.   IF you received labwork today, you will receive an invoice from [facility]. Please contact [facility] at [phone] with questions or concerns regarding your invoice.   Our billing staff will not be able to assist you with questions regarding bills from these companies.  You will be contacted with the lab results as soon as they are available. The fastest way to get your results is to activate your My Chart account. Instructions are located on the last page of this paperwork. If you have not heard from us regarding the results in 2 weeks, please contact this office.     Hypertension Hypertension, commonly called high blood pressure, is when the force of blood pumping through the arteries is too strong. The arteries are the blood vessels that carry blood from the heart throughout the body. Hypertension forces the heart to work harder to pump blood and may cause arteries to become narrow or stiff. Having untreated or uncontrolled hypertension can cause heart attacks, strokes, kidney disease, and other problems. A blood pressure reading consists of a higher number over a lower number. Ideally, your blood pressure should be below 120/80. The first ("top") number is called the systolic pressure. It is a measure of the pressure in your arteries as your heart beats. The second ("bottom") number is called  the diastolic pressure. It is a measure of the pressure in your arteries as the heart relaxes. What are the causes? The cause of this condition is not known. What increases the risk? Some risk factors for high blood pressure are under your control. Others are not. Factors you can change  Smoking.  Having type 2 diabetes mellitus, high cholesterol, or both.  Not getting enough exercise or physical activity.  Being overweight.  Having too much fat, sugar, calories, or salt (sodium) in your diet.  Drinking too much alcohol. Factors that are difficult or impossible to change  Having chronic kidney disease.  Having a family history of high blood pressure.  Age. Risk increases with age.  Race. You may be at higher risk if you are African-American.  Gender. Men are at higher risk than women before age 55. After age 55, women are at higher risk than men.  Having obstructive sleep apnea.  Stress. What are the signs or symptoms? Extremely high blood pressure (hypertensive crisis) may cause:  Headache.  Anxiety.  Shortness of breath.  Nosebleed.  Nausea and vomiting.  Severe chest pain.  Jerky movements you cannot control (seizures). How is this diagnosed? This condition is diagnosed by measuring your blood pressure while you are seated, with your arm resting on a surface. The cuff of the blood pressure monitor will be placed directly against the skin of your upper arm at the level of your heart. It should be measured at least twice using the same arm. Certain conditions can cause a difference in blood pressure between  your right and left arms. Certain factors can cause blood pressure readings to be lower or higher than normal (elevated) for a short period of time:  When your blood pressure is higher when you are in a health care provider's office than when you are at home, this is called white coat hypertension. Most people with this condition do not need medicines.  When  your blood pressure is higher at home than when you are in a health care provider's office, this is called masked hypertension. Most people with this condition may need medicines to control blood pressure. If you have a high blood pressure reading during one visit or you have normal blood pressure with other risk factors:  You may be asked to return on a different day to have your blood pressure checked again.  You may be asked to monitor your blood pressure at home for 1 week or longer. If you are diagnosed with hypertension, you may have other blood or imaging tests to help your health care provider understand your overall risk for other conditions. How is this treated? This condition is treated by making healthy lifestyle changes, such as eating healthy foods, exercising more, and reducing your alcohol intake. Your health care provider may prescribe medicine if lifestyle changes are not enough to get your blood pressure under control, and if:  Your systolic blood pressure is above 130.  Your diastolic blood pressure is above 80. Your personal target blood pressure may vary depending on your medical conditions, your age, and other factors. Follow these instructions at home: Eating and drinking   Eat a diet that is high in fiber and potassium, and low in sodium, added sugar, and fat. An example eating plan is called the DASH (Dietary Approaches to Stop Hypertension) diet. To eat this way: ? Eat plenty of fresh fruits and vegetables. Try to fill half of your plate at each meal with fruits and vegetables. ? Eat whole grains, such as whole wheat pasta, brown rice, or whole grain bread. Fill about one quarter of your plate with whole grains. ? Eat or drink low-fat dairy products, such as skim milk or low-fat yogurt. ? Avoid fatty cuts of meat, processed or cured meats, and poultry with skin. Fill about one quarter of your plate with lean proteins, such as fish, chicken without skin, beans, eggs,  and tofu. ? Avoid premade and processed foods. These tend to be higher in sodium, added sugar, and fat.  Reduce your daily sodium intake. Most people with hypertension should eat less than 1,500 mg of sodium a day.  Limit alcohol intake to no more than 1 drink a day for nonpregnant women and 2 drinks a day for men. One drink equals 12 oz of beer, 5 oz of wine, or 1 oz of hard liquor. Lifestyle   Work with your health care provider to maintain a healthy body weight or to lose weight. Ask what an ideal weight is for you.  Get at least 30 minutes of exercise that causes your heart to beat faster (aerobic exercise) most days of the week. Activities may include walking, swimming, or biking.  Include exercise to strengthen your muscles (resistance exercise), such as pilates or lifting weights, as part of your weekly exercise routine. Try to do these types of exercises for 30 minutes at least 3 days a week.  Do not use any products that contain nicotine or tobacco, such as cigarettes and e-cigarettes. If you need help quitting, ask your health care  provider.  Monitor your blood pressure at home as told by your health care provider.  Keep all follow-up visits as told by your health care provider. This is important. Medicines  Take over-the-counter and prescription medicines only as told by your health care provider. Follow directions carefully. Blood pressure medicines must be taken as prescribed.  Do not skip doses of blood pressure medicine. Doing this puts you at risk for problems and can make the medicine less effective.  Ask your health care provider about side effects or reactions to medicines that you should watch for. Contact a health care provider if:  You think you are having a reaction to a medicine you are taking.  You have headaches that keep coming back (recurring).  You feel dizzy.  You have swelling in your ankles.  You have trouble with your vision. Get help right away  if:  You develop a severe headache or confusion.  You have unusual weakness or numbness.  You feel faint.  You have severe pain in your chest or abdomen.  You vomit repeatedly.  You have trouble breathing. Summary  Hypertension is when the force of blood pumping through your arteries is too strong. If this condition is not controlled, it may put you at risk for serious complications.  Your personal target blood pressure may vary depending on your medical conditions, your age, and other factors. For most people, a normal blood pressure is less than 120/80.  Hypertension is treated with lifestyle changes, medicines, or a combination of both. Lifestyle changes include weight loss, eating a healthy, low-sodium diet, exercising more, and limiting alcohol. This information is not intended to replace advice given to you by your health care provider. Make sure you discuss any questions you have with your health care provider. Document Released: 04/07/2005 Document Revised: 03/05/2016 Document Reviewed: 03/05/2016 Elsevier Interactive Patient Education  2019 [employer]

## 2018-05-06 NOTE — Assessment & Plan Note (Signed)
Blood pressure still uncontrolled.  Patient seems to be compliant medication.  Will increase lisinopril/hydrochlorothiazide 20-12 0.5 to twice a day.  Continue amlodipine 10 mg daily.  Monitor blood pressure readings at home.  Follow-up in 4 weeks.

## 2018-05-06 NOTE — Progress Notes (Signed)
BP Readings from Last 3 Encounters:  05/06/18 (!) 236/135  04/08/18 (!) 170/110  03/10/18 (!) 200/120   Kathleen Cameron B Basford 53 y.o.   Chief Complaint  Patient presents with  . Hypertension    follow up    HISTORY OF PRESENT ILLNESS: This is a 53 y.o. female with history of hypertension here for follow-up.  States she has been compliant with her medication.  Taking amlodipine 10 mg and Prinzide 20/12.5 mg daily.  Non-smoker.  No EtOH abuser.  Asymptomatic.  States blood pressures at home are lower than in the office. Patient works at a [employer].  Recently had a bout of asthmatic bronchitis and was prescribed albuterol.  States that sometimes at work she gets shortness of breath and albuterol helps.  Requesting refill. HPI   Prior to Admission medications   Medication Sig Start Date End Date Taking? Authorizing Provider  ALBUTEROL SULFATE HFA IN Inhale 18 g into the lungs as needed.   Yes [provider]  amLODipine (NORVASC) 10 MG tablet Take 1 tablet (10 mg total) by mouth daily. 04/08/18  Yes Aaylah Pokorny, Eilleen Kempf, MD  lisinopril-hydrochlorothiazide (ZESTORETIC) 20-12.5 MG tablet Take 1 tablet by mouth daily. 04/08/18  Yes Ramel Tobon, Eilleen Kempf, MD  rosuvastatin (CRESTOR) 10 MG tablet Take 1 tablet (10 mg total) by mouth daily. 04/08/18  Yes SagardiaEilleen Kempf, MD    No Known Allergies  Patient Active Problem List   Diagnosis Date Noted  . Uncontrolled hypertension 03/10/2018    Past Medical History:  Diagnosis Date  . Hypertension     No past surgical history on file.  Social History   Socioeconomic History  . Marital status: Single    Spouse name: Not on file  . Number of children: Not on file  . Years of education: Not on file  . Highest education level: Not on file  Occupational History  . Not on file  Social Needs  . Financial resource strain: Not on file  . Food insecurity:    Worry: Not on file    Inability: Not on file  .  Transportation needs:    Medical: Not on file    Non-medical: Not on file  Tobacco Use  . Smoking status: Never Smoker  . Smokeless tobacco: Never Used  Substance and Sexual Activity  . Alcohol use: Yes    Alcohol/week: 2.0 standard drinks    Types: 2 Cans of beer per week  . Drug use: Never  . Sexual activity: Not on file  Lifestyle  . Physical activity:    Days per week: Not on file    Minutes per session: Not on file  . Stress: Not on file  Relationships  . Social connections:    Talks on phone: Not on file    Gets together: Not on file    Attends religious service: Not on file    Active member of club or organization: Not on file    Attends meetings of clubs or organizations: Not on file    Relationship status: Not on file  . Intimate partner violence:    Fear of current or ex partner: Not on file    Emotionally abused: Not on file    Physically abused: Not on file    Forced sexual activity: Not on file  Other Topics Concern  . Not on file  Social History Narrative  . Not on file    No family history on file.   Review of Systems  Constitutional: Negative.  Negative for chills and fever.  HENT: Negative.  Negative for hearing loss.   Eyes: Negative.  Negative for blurred vision and double vision.  Respiratory: Negative.  Negative for cough and shortness of breath.   Cardiovascular: Negative.  Negative for chest pain, palpitations and leg swelling.  Gastrointestinal: Negative for abdominal pain, diarrhea, nausea and vomiting.  Genitourinary: Negative.  Negative for dysuria and hematuria.  Musculoskeletal: Negative.  Negative for myalgias and neck pain.  Skin: Negative.   Neurological: Negative.  Negative for dizziness, sensory change, speech change, focal weakness and headaches.  Endo/Heme/Allergies: Negative.   All other systems reviewed and are negative.   Vitals:   05/06/18 0902  BP: (!) 236/135  Pulse: 95  Resp: 16  Temp: 97.7 F (36.5 C)  SpO2: 96%     Repeat blood pressure in the room: 190/100  Physical Exam Vitals signs reviewed.  Constitutional:      Appearance: Normal appearance.  HENT:     Head: Normocephalic and atraumatic.     Nose: Nose normal.     Mouth/Throat:     Mouth: Mucous membranes are moist.     Pharynx: Oropharynx is clear.  Eyes:     Extraocular Movements: Extraocular movements intact.     Conjunctiva/sclera: Conjunctivae normal.     Pupils: Pupils are equal, round, and reactive to light.  Neck:     Musculoskeletal: Normal range of motion and neck supple.  Cardiovascular:     Rate and Rhythm: Normal rate and regular rhythm.     Heart sounds: Normal heart sounds.  Pulmonary:     Effort: Pulmonary effort is normal.     Breath sounds: Normal breath sounds.  Abdominal:     Palpations: Abdomen is soft.     Tenderness: There is no abdominal tenderness.  Musculoskeletal: Normal range of motion.  Skin:    General: Skin is warm and dry.  Neurological:     General: No focal deficit present.     Mental Status: She is alert and oriented to person, place, and time.      ASSESSMENT & PLAN: Uncontrolled hypertension Blood pressure still uncontrolled.  Patient seems to be compliant medication.  Will increase lisinopril/hydrochlorothiazide 20-12 0.5 to twice a day.  Continue amlodipine 10 mg daily.  Monitor blood pressure readings at home.  Follow-up in 4 weeks.  Kathleen LankJacqueline was seen today for hypertension.  Diagnoses and all orders for this visit:  Uncontrolled hypertension  Other orders -     albuterol (PROVENTIL HFA;VENTOLIN HFA) 108 (90 Base) MCG/ACT inhaler; Inhale 2 puffs into the lungs every 6 (six) hours as needed for wheezing or shortness of breath.    Patient Instructions    Increase lisinopril-hydrochlorothiazide 20/12.5 to twice a day.  Continue amlodipine 10 mg daily.  Monitor blood pressure at home and bring back readings for evaluation.  Follow-up in 1 month.   If you have lab work done  today you will be contacted with your lab results within the next 2 weeks.  If you have not heard from us then please contact us. The fastest way to get your results is to register for My Chart.   IF you received an x-ray today, you will receive an invoice from [facility]. Please contact [facility] at [phone] with questions or concerns regarding your invoice.   IF you received labwork today, you will receive an invoice from [facility]. Please contact [facility] at [phone] with questions or concerns regarding your invoice.  Our billing staff will not be able to assist you with questions regarding bills from these companies.  You will be contacted with the lab results as soon as they are available. The fastest way to get your results is to activate your My Chart account. Instructions are located on the last page of this paperwork. If you have not heard from [place] regarding the results in 2 weeks, please contact this office.     Hypertension Hypertension, commonly called high blood pressure, is when the force of blood pumping through the arteries is too strong. The arteries are the blood vessels that carry blood from the heart throughout the body. Hypertension forces the heart to work harder to pump blood and may cause arteries to become narrow or stiff. Having untreated or uncontrolled hypertension can cause heart attacks, strokes, kidney disease, and other problems. A blood pressure reading consists of a higher number over a lower number. Ideally, your blood pressure should be below 120/80. The first ("top") number is called the systolic pressure. It is a measure of the pressure in your arteries as your heart beats. The second ("bottom") number is called the diastolic pressure. It is a measure of the pressure in your arteries as the heart relaxes. What are the causes? The cause of this condition is not known. What increases the risk? Some risk factors for high blood pressure  are under your control. Others are not. Factors you can change  Smoking.  Having type 2 diabetes mellitus, high cholesterol, or both.  Not getting enough exercise or physical activity.  Being overweight.  Having too much fat, sugar, calories, or salt (sodium) in your diet.  Drinking too much alcohol. Factors that are difficult or impossible to change  Having chronic kidney disease.  Having a family history of high blood pressure.  Age. Risk increases with age.  Race. You may be at higher risk if you are African-American.  Gender. Men are at higher risk than women before age 32. After age 9, women are at higher risk than men.  Having obstructive sleep apnea.  Stress. What are the signs or symptoms? Extremely high blood pressure (hypertensive crisis) may cause:  Headache.  Anxiety.  Shortness of breath.  Nosebleed.  Nausea and vomiting.  Severe chest pain.  Jerky movements you cannot control (seizures). How is this diagnosed? This condition is diagnosed by measuring your blood pressure while you are seated, with your arm resting on a surface. The cuff of the blood pressure monitor will be placed directly against the skin of your upper arm at the level of your heart. It should be measured at least twice using the same arm. Certain conditions can cause a difference in blood pressure between your right and left arms. Certain factors can cause blood pressure readings to be lower or higher than normal (elevated) for a short period of time:  When your blood pressure is higher when you are in a health care provider's office than when you are at home, this is called white coat hypertension. Most people with this condition do not need medicines.  When your blood pressure is higher at home than when you are in a health care provider's office, this is called masked hypertension. Most people with this condition may need medicines to control blood pressure. If you have a high  blood pressure reading during one visit or you have normal blood pressure with other risk factors:  You may be asked to return on a different day to have  your blood pressure checked again.  You may be asked to monitor your blood pressure at home for 1 week or longer. If you are diagnosed with hypertension, you may have other blood or imaging tests to help your health care provider understand your overall risk for other conditions. How is this treated? This condition is treated by making healthy lifestyle changes, such as eating healthy foods, exercising more, and reducing your alcohol intake. Your health care provider may prescribe medicine if lifestyle changes are not enough to get your blood pressure under control, and if:  Your systolic blood pressure is above 130.  Your diastolic blood pressure is above 80. Your personal target blood pressure may vary depending on your medical conditions, your age, and other factors. Follow these instructions at home: Eating and drinking   Eat a diet that is high in fiber and potassium, and low in sodium, added sugar, and fat. An example eating plan is called the DASH (Dietary Approaches to Stop Hypertension) diet. To eat this way: ? Eat plenty of fresh fruits and vegetables. Try to fill half of your plate at each meal with fruits and vegetables. ? Eat whole grains, such as whole wheat pasta, brown rice, or whole grain bread. Fill about one quarter of your plate with whole grains. ? Eat or drink low-fat dairy products, such as skim milk or low-fat yogurt. ? Avoid fatty cuts of meat, processed or cured meats, and poultry with skin. Fill about one quarter of your plate with lean proteins, such as fish, chicken without skin, beans, eggs, and tofu. ? Avoid premade and processed foods. These tend to be higher in sodium, added sugar, and fat.  Reduce your daily sodium intake. Most people with hypertension should eat less than 1,500 mg of sodium a day.  Limit  alcohol intake to no more than 1 drink a day for nonpregnant women and 2 drinks a day for men. One drink equals 12 oz of beer, 5 oz of wine, or 1 oz of hard liquor. Lifestyle   Work with your health care provider to maintain a healthy body weight or to lose weight. Ask what an ideal weight is for you.  Get at least 30 minutes of exercise that causes your heart to beat faster (aerobic exercise) most days of the week. Activities may include walking, swimming, or biking.  Include exercise to strengthen your muscles (resistance exercise), such as pilates or lifting weights, as part of your weekly exercise routine. Try to do these types of exercises for 30 minutes at least 3 days a week.  Do not use any products that contain nicotine or tobacco, such as cigarettes and e-cigarettes. If you need help quitting, ask your health care provider.  Monitor your blood pressure at home as told by your health care provider.  Keep all follow-up visits as told by your health care provider. This is important. Medicines  Take over-the-counter and prescription medicines only as told by your health care provider. Follow directions carefully. Blood pressure medicines must be taken as prescribed.  Do not skip doses of blood pressure medicine. Doing this puts you at risk for problems and can make the medicine less effective.  Ask your health care provider about side effects or reactions to medicines that you should watch for. Contact a health care provider if:  You think you are having a reaction to a medicine you are taking.  You have headaches that keep coming back (recurring).  You feel dizzy.  You have  swelling in your ankles.  You have trouble with your vision. Get help right away if:  You develop a severe headache or confusion.  You have unusual weakness or numbness.  You feel faint.  You have severe pain in your chest or abdomen.  You vomit repeatedly.  You have trouble  breathing. Summary  Hypertension is when the force of blood pumping through your arteries is too strong. If this condition is not controlled, it may put you at risk for serious complications.  Your personal target blood pressure may vary depending on your medical conditions, your age, and other factors. For most people, a normal blood pressure is less than 120/80.  Hypertension is treated with lifestyle changes, medicines, or a combination of both. Lifestyle changes include weight loss, eating a healthy, low-sodium diet, exercising more, and limiting alcohol. This information is not intended to replace advice given to you by your health care provider. Make sure you discuss any questions you have with your health care provider. Document Released: 04/07/2005 Document Revised: 03/05/2016 Document Reviewed: 03/05/2016 [employer]vier Interactive Patient Education  2019 [employer]      Edwina Barth, MD [facility]

## 2018-05-17 ENCOUNTER — Encounter: Payer: Self-pay | Admitting: Advanced Practice Midwife

## 2018-06-03 ENCOUNTER — Ambulatory Visit (INDEPENDENT_AMBULATORY_CARE_PROVIDER_SITE_OTHER): Payer: 59 | Admitting: Emergency Medicine

## 2018-06-03 ENCOUNTER — Other Ambulatory Visit: Payer: Self-pay

## 2018-06-03 ENCOUNTER — Encounter: Payer: Self-pay | Admitting: Emergency Medicine

## 2018-06-03 VITALS — BP 190/100 | HR 91 | Temp 98.1°F | Resp 16 | Ht 60.5 in | Wt 109.8 lb

## 2018-06-03 DIAGNOSIS — E785 Hyperlipidemia, unspecified: Secondary | ICD-10-CM

## 2018-06-03 DIAGNOSIS — I10 Essential (primary) hypertension: Secondary | ICD-10-CM

## 2018-06-03 MED ORDER — HYDROCHLOROTHIAZIDE 25 MG PO TABS: 25.0000 mg | ORAL_TABLET | Freq: Every day | ORAL | 3 refills | Status: DC

## 2018-06-03 MED ORDER — ROSUVASTATIN CALCIUM 10 MG PO TABS: 10.0000 mg | ORAL_TABLET | Freq: Every day | ORAL | 3 refills | Status: DC

## 2018-06-03 MED ORDER — AMLODIPINE BESYLATE 10 MG PO TABS: 10.0000 mg | ORAL_TABLET | Freq: Every day | ORAL | 3 refills | Status: DC

## 2018-06-03 MED ORDER — LISINOPRIL 40 MG PO TABS: 40.0000 mg | ORAL_TABLET | Freq: Every day | ORAL | 3 refills | Status: DC

## 2018-06-03 NOTE — Progress Notes (Signed)
Kathleen Cameron 53 y.o.   Chief Complaint  Patient presents with  . Hypertension    follow up and per patient eyes redness in the mornings   . HISTORY OF PRESENT ILLNESS: This is a 53 y.o. female with history of hypertension here for follow-up.  Complaining of red eyes mostly in the morning.  No other complaints or medical concerns. Review of her medications show that she has been taking amlodipine 5 mg daily and lisinopril 20 mg twice a day.  She was supposed to be taking 10 mg amlodipine and Zestoretic 20/12.5 mg instead.  HPI   Prior to Admission medications   Medication Sig Start Date End Date Taking? Authorizing Provider  albuterol (PROVENTIL HFA;VENTOLIN HFA) 108 (90 Base) MCG/ACT inhaler Inhale 2 puffs into the lungs every 6 (six) hours as needed for wheezing or shortness of breath. 05/06/18  Yes Callahan Peddie, Eilleen KempfMiguel Jose, MD  amLODipine (NORVASC) 10 MG tablet Take 1 tablet (10 mg total) by mouth daily. 04/08/18  Yes Jacion Dismore, Eilleen KempfMiguel Jose, MD  lisinopril-hydrochlorothiazide (ZESTORETIC) 20-12.5 MG tablet Take 1 tablet by mouth daily. 04/08/18  Yes Mayco Walrond, Eilleen KempfMiguel Jose, MD  rosuvastatin (CRESTOR) 10 MG tablet Take 1 tablet (10 mg total) by mouth daily. 04/08/18  Yes SagardiaEilleen Kempf, Leo Weyandt Jose, MD    No Known Allergies  Patient Active Problem List   Diagnosis Date Noted  . Uncontrolled hypertension 03/10/2018    Past Medical History:  Diagnosis Date  . Hypertension     No past surgical history on file.  Social History   Socioeconomic History  . Marital status: Single    Spouse name: Not on file  . Number of children: Not on file  . Years of education: Not on file  . Highest education level: Not on file  Occupational History  . Not on file  Social Needs  . Financial resource strain: Not on file  . Food insecurity:    Worry: Not on file    Inability: Not on file  . Transportation needs:    Medical: Not on file    Non-medical: Not on file  Tobacco Use  .  Smoking status: Never Smoker  . Smokeless tobacco: Never Used  Substance and Sexual Activity  . Alcohol use: Yes    Alcohol/week: 2.0 standard drinks    Types: 2 Cans of beer per week  . Drug use: Never  . Sexual activity: Not on file  Lifestyle  . Physical activity:    Days per week: Not on file    Minutes per session: Not on file  . Stress: Not on file  Relationships  . Social connections:    Talks on phone: Not on file    Gets together: Not on file    Attends religious service: Not on file    Active member of club or organization: Not on file    Attends meetings of clubs or organizations: Not on file    Relationship status: Not on file  . Intimate partner violence:    Fear of current or ex partner: Not on file    Emotionally abused: Not on file    Physically abused: Not on file    Forced sexual activity: Not on file  Other Topics Concern  . Not on file  Social History Narrative  . Not on file    No family history on file.   Review of Systems  Constitutional: Negative.  Negative for chills and fever.  HENT: Negative.   Eyes: Negative.  Negative  for blurred vision and double vision.  Respiratory: Negative for cough, hemoptysis and wheezing.        Dyspnea on exertion  Cardiovascular: Negative.  Negative for chest pain and palpitations.  Gastrointestinal: Negative.  Negative for abdominal pain, nausea and vomiting.  Musculoskeletal: Negative.   Skin: Negative.  Negative for rash.  Neurological: Negative for dizziness and headaches.  All other systems reviewed and are negative.     Vitals:   06/03/18 0913 06/03/18 0942  BP: (!) 225/111 (!) 190/100  Pulse: 91   Resp: 16   Temp: 98.1 F (36.7 C)   SpO2: 97%      Physical Exam Vitals signs reviewed.  Constitutional:      Appearance: Normal appearance.  HENT:     Head: Normocephalic and atraumatic.     Nose: Nose normal.     Mouth/Throat:     Mouth: Mucous membranes are moist.     Pharynx: Oropharynx  is clear.  Eyes:     Extraocular Movements: Extraocular movements intact.     Conjunctiva/sclera: Conjunctivae normal.     Pupils: Pupils are equal, round, and reactive to light.  Neck:     Musculoskeletal: Normal range of motion and neck supple.  Cardiovascular:     Rate and Rhythm: Normal rate and regular rhythm.     Heart sounds: Normal heart sounds.  Pulmonary:     Effort: Pulmonary effort is normal.     Breath sounds: Normal breath sounds.  Abdominal:     General: Bowel sounds are normal. There is no distension.     Palpations: Abdomen is soft.     Tenderness: There is no abdominal tenderness.  Musculoskeletal: Normal range of motion.  Skin:    General: Skin is warm and dry.     Capillary Refill: Capillary refill takes less than 2 seconds.  Neurological:     General: No focal deficit present.     Mental Status: She is alert and oriented to person, place, and time.  Psychiatric:        Mood and Affect: Mood normal.        Behavior: Behavior normal.    A total of 25 minutes was spent in the room with the patient, greater than 50% of which was in counseling/coordination of care regarding hypertension, treatment and management, diet and nutrition, medications and dose adjustments, side effects, prognosis, and need for follow-up in 4 weeks.   ASSESSMENT & PLAN: Uncontrolled hypertension Still uncontrolled blood pressure.  Home numbers similar to office numbers.  Will increase lisinopril to 40 mg a day, increase amlodipine to 10 mg a day, and add hydrochlorothiazide 25 mg a day.  Follow-up in 4 weeks.  [name] was seen today for hypertension.  Diagnoses and all orders for this visit:  Uncontrolled hypertension -     amLODipine (NORVASC) 10 MG tablet; Take 1 tablet (10 mg total) by mouth daily. -     lisinopril (PRINIVIL,ZESTRIL) 40 MG tablet; Take 1 tablet (40 mg total) by mouth daily. -     hydrochlorothiazide (HYDRODIURIL) 25 MG tablet; Take 1 tablet (25 mg total) by  mouth daily.  Dyslipidemia -     rosuvastatin (CRESTOR) 10 MG tablet; Take 1 tablet (10 mg total) by mouth daily.    Patient Instructions       If you have lab work done today you will be contacted with your lab results within the next 2 weeks.  If you have not heard from [place] then please  contact us. The fastest way to get your results is to register for My Chart.   IF you received an x-ray today, you will receive an invoice from [facility]. Please contact [facility] at [phone] with questions or concerns regarding your invoice.   IF you received labwork today, you will receive an invoice from [facility]. Please contact [facility] at [phone] with questions or concerns regarding your invoice.   Our billing staff will not be able to assist you with questions regarding bills from these companies.  You will be contacted with the lab results as soon as they are available. The fastest way to get your results is to activate your My Chart account. Instructions are located on the last page of this paperwork. If you have not heard from [place] regarding the results in 2 weeks, please contact this office.     Hypertension Hypertension, commonly called high blood pressure, is when the force of blood pumping through the arteries is too strong. The arteries are the blood vessels that carry blood from the heart throughout the body. Hypertension forces the heart to work harder to pump blood and may cause arteries to become narrow or stiff. Having untreated or uncontrolled hypertension can cause heart attacks, strokes, kidney disease, and other problems. A blood pressure reading consists of a higher number over a lower number. Ideally, your blood pressure should be below 120/80. The first ("top") number is called the systolic pressure. It is a measure of the pressure in your arteries as your heart beats. The second ("bottom") number is called the diastolic pressure. It is a measure of  the pressure in your arteries as the heart relaxes. What are the causes? The cause of this condition is not known. What increases the risk? Some risk factors for high blood pressure are under your control. Others are not. Factors you can change  Smoking.  Having type 2 diabetes mellitus, high cholesterol, or both.  Not getting enough exercise or physical activity.  Being overweight.  Having too much fat, sugar, calories, or salt (sodium) in your diet.  Drinking too much alcohol. Factors that are difficult or impossible to change  Having chronic kidney disease.  Having a family history of high blood pressure.  Age. Risk increases with age.  Race. You may be at higher risk if you are African-American.  Gender. Men are at higher risk than women before age 79. After age 39, women are at higher risk than men.  Having obstructive sleep apnea.  Stress. What are the signs or symptoms? Extremely high blood pressure (hypertensive crisis) may cause:  Headache.  Anxiety.  Shortness of breath.  Nosebleed.  Nausea and vomiting.  Severe chest pain.  Jerky movements you cannot control (seizures). How is this diagnosed? This condition is diagnosed by measuring your blood pressure while you are seated, with your arm resting on a surface. The cuff of the blood pressure monitor will be placed directly against the skin of your upper arm at the level of your heart. It should be measured at least twice using the same arm. Certain conditions can cause a difference in blood pressure between your right and left arms. Certain factors can cause blood pressure readings to be lower or higher than normal (elevated) for a short period of time:  When your blood pressure is higher when you are in a health care provider's office than when you are at home, this is called white coat hypertension. Most people with this condition do not need  medicines.  When your blood pressure is higher at home than  when you are in a health care provider's office, this is called masked hypertension. Most people with this condition may need medicines to control blood pressure. If you have a high blood pressure reading during one visit or you have normal blood pressure with other risk factors:  You may be asked to return on a different day to have your blood pressure checked again.  You may be asked to monitor your blood pressure at home for 1 week or longer. If you are diagnosed with hypertension, you may have other blood or imaging tests to help your health care provider understand your overall risk for other conditions. How is this treated? This condition is treated by making healthy lifestyle changes, such as eating healthy foods, exercising more, and reducing your alcohol intake. Your health care provider may prescribe medicine if lifestyle changes are not enough to get your blood pressure under control, and if:  Your systolic blood pressure is above 130.  Your diastolic blood pressure is above 80. Your personal target blood pressure may vary depending on your medical conditions, your age, and other factors. Follow these instructions at home: Eating and drinking   Eat a diet that is high in fiber and potassium, and low in sodium, added sugar, and fat. An example eating plan is called the DASH (Dietary Approaches to Stop Hypertension) diet. To eat this way: ? Eat plenty of fresh fruits and vegetables. Try to fill half of your plate at each meal with fruits and vegetables. ? Eat whole grains, such as whole wheat pasta, brown rice, or whole grain bread. Fill about one quarter of your plate with whole grains. ? Eat or drink low-fat dairy products, such as skim milk or low-fat yogurt. ? Avoid fatty cuts of meat, processed or cured meats, and poultry with skin. Fill about one quarter of your plate with lean proteins, such as fish, chicken without skin, beans, eggs, and tofu. ? Avoid premade and processed  foods. These tend to be higher in sodium, added sugar, and fat.  Reduce your daily sodium intake. Most people with hypertension should eat less than 1,500 mg of sodium a day.  Limit alcohol intake to no more than 1 drink a day for nonpregnant women and 2 drinks a day for men. One drink equals 12 oz of beer, 5 oz of wine, or 1 oz of hard liquor. Lifestyle   Work with your health care provider to maintain a healthy body weight or to lose weight. Ask what an ideal weight is for you.  Get at least 30 minutes of exercise that causes your heart to beat faster (aerobic exercise) most days of the week. Activities may include walking, swimming, or biking.  Include exercise to strengthen your muscles (resistance exercise), such as pilates or lifting weights, as part of your weekly exercise routine. Try to do these types of exercises for 30 minutes at least 3 days a week.  Do not use any products that contain nicotine or tobacco, such as cigarettes and e-cigarettes. If you need help quitting, ask your health care provider.  Monitor your blood pressure at home as told by your health care provider.  Keep all follow-up visits as told by your health care provider. This is important. Medicines  Take over-the-counter and prescription medicines only as told by your health care provider. Follow directions carefully. Blood pressure medicines must be taken as prescribed.  Do not skip doses of  blood pressure medicine. Doing this puts you at risk for problems and can make the medicine less effective.  Ask your health care provider about side effects or reactions to medicines that you should watch for. Contact a health care provider if:  You think you are having a reaction to a medicine you are taking.  You have headaches that keep coming back (recurring).  You feel dizzy.  You have swelling in your ankles.  You have trouble with your vision. Get help right away if:  You develop a severe headache or  confusion.  You have unusual weakness or numbness.  You feel faint.  You have severe pain in your chest or abdomen.  You vomit repeatedly.  You have trouble breathing. Summary  Hypertension is when the force of blood pumping through your arteries is too strong. If this condition is not controlled, it may put you at risk for serious complications.  Your personal target blood pressure may vary depending on your medical conditions, your age, and other factors. For most people, a normal blood pressure is less than 120/80.  Hypertension is treated with lifestyle changes, medicines, or a combination of both. Lifestyle changes include weight loss, eating a healthy, low-sodium diet, exercising more, and limiting alcohol. This information is not intended to replace advice given to you by your health care provider. Make sure you discuss any questions you have with your health care provider. Document Released: 04/07/2005 Document Revised: 03/05/2016 Document Reviewed: 03/05/2016 Elsevier Interactive Patient Education  2019 [employer]vier Inc.      Edwina Barth, MD [facility]

## 2018-06-03 NOTE — Assessment & Plan Note (Signed)
Still uncontrolled blood pressure.  Home numbers similar to office numbers.  Will increase lisinopril to 40 mg a day, increase amlodipine to 10 mg a day, and add hydrochlorothiazide 25 mg a day.  Follow-up in 4 weeks.

## 2018-06-03 NOTE — Patient Instructions (Addendum)
   If you have lab work done today you will be contacted with your lab results within the next 2 weeks.  If you have not heard from us then please contact us. The fastest way to get your results is to register for My Chart.   IF you received an x-ray today, you will receive an invoice from [facility]. Please contact [facility] at [phone] with questions or concerns regarding your invoice.   IF you received labwork today, you will receive an invoice from [facility]. Please contact [facility] at [phone] with questions or concerns regarding your invoice.   Our billing staff will not be able to assist you with questions regarding bills from these companies.  You will be contacted with the lab results as soon as they are available. The fastest way to get your results is to activate your My Chart account. Instructions are located on the last page of this paperwork. If you have not heard from us regarding the results in 2 weeks, please contact this office.       Hypertension Hypertension, commonly called high blood pressure, is when the force of blood pumping through the arteries is too strong. The arteries are the blood vessels that carry blood from the heart throughout the body. Hypertension forces the heart to work harder to pump blood and may cause arteries to become narrow or stiff. Having untreated or uncontrolled hypertension can cause heart attacks, strokes, kidney disease, and other problems. A blood pressure reading consists of a higher number over a lower number. Ideally, your blood pressure should be below 120/80. The first ("top") number is called the systolic pressure. It is a measure of the pressure in your arteries as your heart beats. The second ("bottom") number is called the diastolic pressure. It is a measure of the pressure in your arteries as the heart relaxes. What are the causes? The cause of this condition is not known. What increases the  risk? Some risk factors for high blood pressure are under your control. Others are not. Factors you can change  Smoking.  Having type 2 diabetes mellitus, high cholesterol, or both.  Not getting enough exercise or physical activity.  Being overweight.  Having too much fat, sugar, calories, or salt (sodium) in your diet.  Drinking too much alcohol. Factors that are difficult or impossible to change  Having chronic kidney disease.  Having a family history of high blood pressure.  Age. Risk increases with age.  Race. You may be at higher risk if you are African-American.  Gender. Men are at higher risk than women before age 45. After age 65, women are at higher risk than men.  Having obstructive sleep apnea.  Stress. What are the signs or symptoms? Extremely high blood pressure (hypertensive crisis) may cause:  Headache.  Anxiety.  Shortness of breath.  Nosebleed.  Nausea and vomiting.  Severe chest pain.  Jerky movements you cannot control (seizures). How is this diagnosed? This condition is diagnosed by measuring your blood pressure while you are seated, with your arm resting on a surface. The cuff of the blood pressure monitor will be placed directly against the skin of your upper arm at the level of your heart. It should be measured at least twice using the same arm. Certain conditions can cause a difference in blood pressure between your right and left arms. Certain factors can cause blood pressure readings to be lower or higher than normal (elevated) for a short period of time:    When your blood pressure is higher when you are in a health care provider's office than when you are at home, this is called white coat hypertension. Most people with this condition do not need medicines.  When your blood pressure is higher at home than when you are in a health care provider's office, this is called masked hypertension. Most people with this condition may need medicines  to control blood pressure. If you have a high blood pressure reading during one visit or you have normal blood pressure with other risk factors:  You may be asked to return on a different day to have your blood pressure checked again.  You may be asked to monitor your blood pressure at home for 1 week or longer. If you are diagnosed with hypertension, you may have other blood or imaging tests to help your health care provider understand your overall risk for other conditions. How is this treated? This condition is treated by making healthy lifestyle changes, such as eating healthy foods, exercising more, and reducing your alcohol intake. Your health care provider may prescribe medicine if lifestyle changes are not enough to get your blood pressure under control, and if:  Your systolic blood pressure is above 130.  Your diastolic blood pressure is above 80. Your personal target blood pressure may vary depending on your medical conditions, your age, and other factors. Follow these instructions at home: Eating and drinking   Eat a diet that is high in fiber and potassium, and low in sodium, added sugar, and fat. An example eating plan is called the DASH (Dietary Approaches to Stop Hypertension) diet. To eat this way: ? Eat plenty of fresh fruits and vegetables. Try to fill half of your plate at each meal with fruits and vegetables. ? Eat whole grains, such as whole wheat pasta, brown rice, or whole grain bread. Fill about one quarter of your plate with whole grains. ? Eat or drink low-fat dairy products, such as skim milk or low-fat yogurt. ? Avoid fatty cuts of meat, processed or cured meats, and poultry with skin. Fill about one quarter of your plate with lean proteins, such as fish, chicken without skin, beans, eggs, and tofu. ? Avoid premade and processed foods. These tend to be higher in sodium, added sugar, and fat.  Reduce your daily sodium intake. Most people with hypertension should  eat less than 1,500 mg of sodium a day.  Limit alcohol intake to no more than 1 drink a day for nonpregnant women and 2 drinks a day for men. One drink equals 12 oz of beer, 5 oz of wine, or 1 oz of hard liquor. Lifestyle   Work with your health care provider to maintain a healthy body weight or to lose weight. Ask what an ideal weight is for you.  Get at least 30 minutes of exercise that causes your heart to beat faster (aerobic exercise) most days of the week. Activities may include walking, swimming, or biking.  Include exercise to strengthen your muscles (resistance exercise), such as pilates or lifting weights, as part of your weekly exercise routine. Try to do these types of exercises for 30 minutes at least 3 days a week.  Do not use any products that contain nicotine or tobacco, such as cigarettes and e-cigarettes. If you need help quitting, ask your health care provider.  Monitor your blood pressure at home as told by your health care provider.  Keep all follow-up visits as told by your health care provider.   This is important. Medicines  Take over-the-counter and prescription medicines only as told by your health care provider. Follow directions carefully. Blood pressure medicines must be taken as prescribed.  Do not skip doses of blood pressure medicine. Doing this puts you at risk for problems and can make the medicine less effective.  Ask your health care provider about side effects or reactions to medicines that you should watch for. Contact a health care provider if:  You think you are having a reaction to a medicine you are taking.  You have headaches that keep coming back (recurring).  You feel dizzy.  You have swelling in your ankles.  You have trouble with your vision. Get help right away if:  You develop a severe headache or confusion.  You have unusual weakness or numbness.  You feel faint.  You have severe pain in your chest or abdomen.  You vomit  repeatedly.  You have trouble breathing. Summary  Hypertension is when the force of blood pumping through your arteries is too strong. If this condition is not controlled, it may put you at risk for serious complications.  Your personal target blood pressure may vary depending on your medical conditions, your age, and other factors. For most people, a normal blood pressure is less than 120/80.  Hypertension is treated with lifestyle changes, medicines, or a combination of both. Lifestyle changes include weight loss, eating a healthy, low-sodium diet, exercising more, and limiting alcohol. This information is not intended to replace advice given to you by your health care provider. Make sure you discuss any questions you have with your health care provider. Document Released: 04/07/2005 Document Revised: 03/05/2016 Document Reviewed: 03/05/2016 Elsevier Interactive Patient Education  2019 [employer] Inc.  

## 2018-06-08 ENCOUNTER — Ambulatory Visit: Payer: Self-pay

## 2018-06-08 DIAGNOSIS — E785 Hyperlipidemia, unspecified: Secondary | ICD-10-CM

## 2018-06-08 DIAGNOSIS — I10 Essential (primary) hypertension: Secondary | ICD-10-CM

## 2018-06-08 MED ORDER — AMLODIPINE BESYLATE 10 MG PO TABS: 10.0000 mg | ORAL_TABLET | Freq: Every day | ORAL | 3 refills | Status: DC

## 2018-06-08 MED ORDER — ROSUVASTATIN CALCIUM 10 MG PO TABS: 10.0000 mg | ORAL_TABLET | Freq: Every day | ORAL | 3 refills | Status: DC

## 2018-06-08 MED ORDER — LISINOPRIL 40 MG PO TABS: 40.0000 mg | ORAL_TABLET | Freq: Every day | ORAL | 3 refills | Status: DC

## 2018-06-08 MED ORDER — HYDROCHLOROTHIAZIDE 25 MG PO TABS: 25.0000 mg | ORAL_TABLET | Freq: Every day | ORAL | 3 refills | Status: DC

## 2018-06-08 NOTE — Telephone Encounter (Signed)
Pt requesting medication amlodipine, HCTZ,lisinopril, rosuvastatin sent to a different pharmacy.  Reason for Disposition . [1] Prescription not at pharmacy AND [2] was prescribed today by PCP    Pt requesting medications that were filled on the 13 th sent to a different pharmacy  Answer Assessment - Initial Assessment Questions 1. SYMPTOMS: "Do you have any symptoms?"     n/a 2. SEVERITY: If symptoms are present, ask "Are they mild, moderate or severe?"     n/a  Protocols used: MEDICATION QUESTION CALL-A-AH

## 2018-07-07 ENCOUNTER — Ambulatory Visit (INDEPENDENT_AMBULATORY_CARE_PROVIDER_SITE_OTHER): Payer: 59 | Admitting: Emergency Medicine

## 2018-07-07 ENCOUNTER — Encounter: Payer: Self-pay | Admitting: Emergency Medicine

## 2018-07-07 ENCOUNTER — Other Ambulatory Visit: Payer: Self-pay

## 2018-07-07 DIAGNOSIS — I10 Essential (primary) hypertension: Secondary | ICD-10-CM | POA: Diagnosis not present

## 2018-07-07 DIAGNOSIS — E785 Hyperlipidemia, unspecified: Secondary | ICD-10-CM

## 2018-07-07 MED ORDER — HYDROCHLOROTHIAZIDE 25 MG PO TABS: 25.0000 mg | ORAL_TABLET | Freq: Every day | ORAL | 3 refills | Status: DC

## 2018-07-07 MED ORDER — ROSUVASTATIN CALCIUM 10 MG PO TABS: 10.0000 mg | ORAL_TABLET | Freq: Every day | ORAL | 3 refills | Status: DC

## 2018-07-07 MED ORDER — LISINOPRIL 40 MG PO TABS: 40.0000 mg | ORAL_TABLET | Freq: Every day | ORAL | 3 refills | Status: DC

## 2018-07-07 MED ORDER — AMLODIPINE BESYLATE 10 MG PO TABS: 10.0000 mg | ORAL_TABLET | Freq: Every day | ORAL | 3 refills | Status: DC

## 2018-07-07 NOTE — Patient Instructions (Addendum)
   If you have lab work done today you will be contacted with your lab results within the next 2 weeks.  If you have not heard from us then please contact us. The fastest way to get your results is to register for My Chart.   IF you received an x-ray today, you will receive an invoice from [facility]. Please contact [facility] at [phone] with questions or concerns regarding your invoice.   IF you received labwork today, you will receive an invoice from [facility]. Please contact [facility] at [phone] with questions or concerns regarding your invoice.   Our billing staff will not be able to assist you with questions regarding bills from these companies.  You will be contacted with the lab results as soon as they are available. The fastest way to get your results is to activate your My Chart account. Instructions are located on the last page of this paperwork. If you have not heard from us regarding the results in 2 weeks, please contact this office.       Hypertension Hypertension, commonly called high blood pressure, is when the force of blood pumping through the arteries is too strong. The arteries are the blood vessels that carry blood from the heart throughout the body. Hypertension forces the heart to work harder to pump blood and may cause arteries to become narrow or stiff. Having untreated or uncontrolled hypertension can cause heart attacks, strokes, kidney disease, and other problems. A blood pressure reading consists of a higher number over a lower number. Ideally, your blood pressure should be below 120/80. The first ("top") number is called the systolic pressure. It is a measure of the pressure in your arteries as your heart beats. The second ("bottom") number is called the diastolic pressure. It is a measure of the pressure in your arteries as the heart relaxes. What are the causes? The cause of this condition is not known. What increases the  risk? Some risk factors for high blood pressure are under your control. Others are not. Factors you can change  Smoking.  Having type 2 diabetes mellitus, high cholesterol, or both.  Not getting enough exercise or physical activity.  Being overweight.  Having too much fat, sugar, calories, or salt (sodium) in your diet.  Drinking too much alcohol. Factors that are difficult or impossible to change  Having chronic kidney disease.  Having a family history of high blood pressure.  Age. Risk increases with age.  Race. You may be at higher risk if you are African-American.  Gender. Men are at higher risk than women before age 45. After age 65, women are at higher risk than men.  Having obstructive sleep apnea.  Stress. What are the signs or symptoms? Extremely high blood pressure (hypertensive crisis) may cause:  Headache.  Anxiety.  Shortness of breath.  Nosebleed.  Nausea and vomiting.  Severe chest pain.  Jerky movements you cannot control (seizures). How is this diagnosed? This condition is diagnosed by measuring your blood pressure while you are seated, with your arm resting on a surface. The cuff of the blood pressure monitor will be placed directly against the skin of your upper arm at the level of your heart. It should be measured at least twice using the same arm. Certain conditions can cause a difference in blood pressure between your right and left arms. Certain factors can cause blood pressure readings to be lower or higher than normal (elevated) for a short period of time:    When your blood pressure is higher when you are in a health care provider's office than when you are at home, this is called white coat hypertension. Most people with this condition do not need medicines.  When your blood pressure is higher at home than when you are in a health care provider's office, this is called masked hypertension. Most people with this condition may need medicines  to control blood pressure. If you have a high blood pressure reading during one visit or you have normal blood pressure with other risk factors:  You may be asked to return on a different day to have your blood pressure checked again.  You may be asked to monitor your blood pressure at home for 1 week or longer. If you are diagnosed with hypertension, you may have other blood or imaging tests to help your health care provider understand your overall risk for other conditions. How is this treated? This condition is treated by making healthy lifestyle changes, such as eating healthy foods, exercising more, and reducing your alcohol intake. Your health care provider may prescribe medicine if lifestyle changes are not enough to get your blood pressure under control, and if:  Your systolic blood pressure is above 130.  Your diastolic blood pressure is above 80. Your personal target blood pressure may vary depending on your medical conditions, your age, and other factors. Follow these instructions at home: Eating and drinking   Eat a diet that is high in fiber and potassium, and low in sodium, added sugar, and fat. An example eating plan is called the DASH (Dietary Approaches to Stop Hypertension) diet. To eat this way: ? Eat plenty of fresh fruits and vegetables. Try to fill half of your plate at each meal with fruits and vegetables. ? Eat whole grains, such as whole wheat pasta, brown rice, or whole grain bread. Fill about one quarter of your plate with whole grains. ? Eat or drink low-fat dairy products, such as skim milk or low-fat yogurt. ? Avoid fatty cuts of meat, processed or cured meats, and poultry with skin. Fill about one quarter of your plate with lean proteins, such as fish, chicken without skin, beans, eggs, and tofu. ? Avoid premade and processed foods. These tend to be higher in sodium, added sugar, and fat.  Reduce your daily sodium intake. Most people with hypertension should  eat less than 1,500 mg of sodium a day.  Limit alcohol intake to no more than 1 drink a day for nonpregnant women and 2 drinks a day for men. One drink equals 12 oz of beer, 5 oz of wine, or 1 oz of hard liquor. Lifestyle   Work with your health care provider to maintain a healthy body weight or to lose weight. Ask what an ideal weight is for you.  Get at least 30 minutes of exercise that causes your heart to beat faster (aerobic exercise) most days of the week. Activities may include walking, swimming, or biking.  Include exercise to strengthen your muscles (resistance exercise), such as pilates or lifting weights, as part of your weekly exercise routine. Try to do these types of exercises for 30 minutes at least 3 days a week.  Do not use any products that contain nicotine or tobacco, such as cigarettes and e-cigarettes. If you need help quitting, ask your health care provider.  Monitor your blood pressure at home as told by your health care provider.  Keep all follow-up visits as told by your health care provider.   This is important. Medicines  Take over-the-counter and prescription medicines only as told by your health care provider. Follow directions carefully. Blood pressure medicines must be taken as prescribed.  Do not skip doses of blood pressure medicine. Doing this puts you at risk for problems and can make the medicine less effective.  Ask your health care provider about side effects or reactions to medicines that you should watch for. Contact a health care provider if:  You think you are having a reaction to a medicine you are taking.  You have headaches that keep coming back (recurring).  You feel dizzy.  You have swelling in your ankles.  You have trouble with your vision. Get help right away if:  You develop a severe headache or confusion.  You have unusual weakness or numbness.  You feel faint.  You have severe pain in your chest or abdomen.  You vomit  repeatedly.  You have trouble breathing. Summary  Hypertension is when the force of blood pumping through your arteries is too strong. If this condition is not controlled, it may put you at risk for serious complications.  Your personal target blood pressure may vary depending on your medical conditions, your age, and other factors. For most people, a normal blood pressure is less than 120/80.  Hypertension is treated with lifestyle changes, medicines, or a combination of both. Lifestyle changes include weight loss, eating a healthy, low-sodium diet, exercising more, and limiting alcohol. This information is not intended to replace advice given to you by your health care provider. Make sure you discuss any questions you have with your health care provider. Document Released: 04/07/2005 Document Revised: 03/05/2016 Document Reviewed: 03/05/2016 [employer] Interactive Patient Education  2019 [employer]  

## 2018-07-07 NOTE — Assessment & Plan Note (Signed)
Much improved.  Continue present medications and follow-up in 6 months.

## 2018-07-07 NOTE — Progress Notes (Signed)
BP Readings from Last 3 Encounters:  07/07/18 (!) 152/86  06/03/18 (!) 190/100  05/06/18 (!) 242/120   The 10-year ASCVD risk score Kathleen Cameron., et al., 2013) is: 9.2%   Values used to calculate the score:     Age: 53 years     Sex: Female     Is Non-Hispanic African American: Yes     Diabetic: No     Tobacco smoker: No     Systolic Blood Pressure: 152 mmHg     Is BP treated: Yes     HDL Cholesterol: 72 mg/dL     Total Cholesterol: 300 mg/dL Kathleen Cameron 53 y.o.   Chief Complaint  Patient presents with  . Hypertension    follow up     HISTORY OF PRESENT ILLNESS: This is a 53 y.o. female with history of uncontrolled hypertension here for follow-up and medication refill.  No better.  Has no complaints or medical concerns today.  Blood pressure much improved.  HPI   Prior to Admission medications   Medication Sig Start Date End Date Taking? Authorizing Provider  albuterol (PROVENTIL HFA;VENTOLIN HFA) 108 (90 Base) MCG/ACT inhaler Inhale 2 puffs into the lungs every 6 (six) hours as needed for wheezing or shortness of breath. 05/06/18  Yes Sharyl Panchal, Eilleen Kempf, MD  amLODipine (NORVASC) 10 MG tablet Take 1 tablet (10 mg total) by mouth daily. 07/07/18  Yes Atticus Wedin, Eilleen Kempf, MD  hydrochlorothiazide (HYDRODIURIL) 25 MG tablet Take 1 tablet (25 mg total) by mouth daily. 07/07/18  Yes Brezlyn Manrique, Eilleen Kempf, MD  lisinopril (PRINIVIL,ZESTRIL) 40 MG tablet Take 1 tablet (40 mg total) by mouth daily. 07/07/18 10/05/18 Yes Zebulin Siegel, Eilleen Kempf, MD  rosuvastatin (CRESTOR) 10 MG tablet Take 1 tablet (10 mg total) by mouth daily. 07/07/18  Yes SagardiaEilleen Kempf, MD    No Known Allergies  Patient Active Problem List   Diagnosis Date Noted  . Dyslipidemia 07/07/2018  . Uncontrolled hypertension 03/10/2018    Past Medical History:  Diagnosis Date  . Hypertension     No past surgical history on file.  Social History   Socioeconomic History  . Marital status:  Single    Spouse name: Not on file  . Number of children: Not on file  . Years of education: Not on file  . Highest education level: Not on file  Occupational History  . Not on file  Social Needs  . Financial resource strain: Not on file  . Food insecurity:    Worry: Not on file    Inability: Not on file  . Transportation needs:    Medical: Not on file    Non-medical: Not on file  Tobacco Use  . Smoking status: Never Smoker  . Smokeless tobacco: Never Used  Substance and Sexual Activity  . Alcohol use: Yes    Alcohol/week: 2.0 standard drinks    Types: 2 Cans of beer per week  . Drug use: Never  . Sexual activity: Not on file  Lifestyle  . Physical activity:    Days per week: Not on file    Minutes per session: Not on file  . Stress: Not on file  Relationships  . Social connections:    Talks on phone: Not on file    Gets together: Not on file    Attends religious service: Not on file    Active member of club or organization: Not on file    Attends meetings of clubs or organizations: Not on file  Relationship status: Not on file  . Intimate partner violence:    Fear of current or ex partner: Not on file    Emotionally abused: Not on file    Physically abused: Not on file    Forced sexual activity: Not on file  Other Topics Concern  . Not on file  Social History Narrative  . Not on file    No family history on file.   Review of Systems  Constitutional: Negative.  Negative for chills and fever.  HENT: Negative.   Eyes: Negative.  Negative for blurred vision.  Respiratory: Negative.  Negative for cough and shortness of breath.   Cardiovascular: Negative.  Negative for chest pain and palpitations.  Gastrointestinal: Negative.  Negative for abdominal pain, blood in stool, diarrhea, melena, nausea and vomiting.  Genitourinary: Negative.  Negative for dysuria.  Musculoskeletal: Negative.  Negative for myalgias.  Skin: Negative.  Negative for rash.  Neurological:  Negative.  Negative for dizziness and headaches.  All other systems reviewed and are negative.  Vitals:   07/07/18 0859  BP: (!) 152/86  Pulse: (!) 101  Resp: 18  Temp: 98.2 F (36.8 C)  SpO2: 92%     Physical Exam Vitals signs reviewed.  Constitutional:      Appearance: Normal appearance.  HENT:     Head: Normocephalic and atraumatic.     Nose: Nose normal.     Mouth/Throat:     Mouth: Mucous membranes are moist.     Pharynx: Oropharynx is clear.  Eyes:     Extraocular Movements: Extraocular movements intact.     Conjunctiva/sclera: Conjunctivae normal.     Pupils: Pupils are equal, round, and reactive to light.  Neck:     Musculoskeletal: Normal range of motion and neck supple.  Cardiovascular:     Rate and Rhythm: Normal rate and regular rhythm.  Pulmonary:     Effort: Pulmonary effort is normal.     Breath sounds: Normal breath sounds.  Musculoskeletal: Normal range of motion.  Skin:    General: Skin is warm and dry.     Capillary Refill: Capillary refill takes less than 2 seconds.  Neurological:     General: No focal deficit present.     Mental Status: She is alert and oriented to person, place, and time.     Sensory: No sensory deficit.     Motor: No weakness.  Psychiatric:        Mood and Affect: Mood normal.        Behavior: Behavior normal.    A t drop it slowly otal of 25 minutes was spent in the room with the patient, greater than 50% of which was in counseling/coordination of care regarding hypertension, treatment, medications, diet, prognosis, and need for follow-up.   ASSESSMENT & PLAN: Uncontrolled hypertension Much improved.  Continue present medications and follow-up in 6 months.   [name] was seen today for hypertension.  Diagnoses and all orders for this visit:  Uncontrolled hypertension Comments: Improved Orders: -     lisinopril (PRINIVIL,ZESTRIL) 40 MG tablet; Take 1 tablet (40 mg total) by mouth daily. -      hydrochlorothiazide (HYDRODIURIL) 25 MG tablet; Take 1 tablet (25 mg total) by mouth daily. -     amLODipine (NORVASC) 10 MG tablet; Take 1 tablet (10 mg total) by mouth daily.  Dyslipidemia -     rosuvastatin (CRESTOR) 10 MG tablet; Take 1 tablet (10 mg total) by mouth daily.    Patient Instructions  If you have lab work done today you will be contacted with your lab results within the next 2 weeks.  If you have not heard from [place] then please contact us. The fastest way to get your results is to register for My Chart.   IF you received an x-ray today, you will receive an invoice from [facility]. Please contact [facility] at [phone] with questions or concerns regarding your invoice.   IF you received labwork today, you will receive an invoice from [facility]. Please contact [facility] at [phone] with questions or concerns regarding your invoice.   Our billing staff will not be able to assist you with questions regarding bills from these companies.  You will be contacted with the lab results as soon as they are available. The fastest way to get your results is to activate your My Chart account. Instructions are located on the last page of this paperwork. If you have not heard from [place] regarding the results in 2 weeks, please contact this office.     Hypertension Hypertension, commonly called high blood pressure, is when the force of blood pumping through the arteries is too strong. The arteries are the blood vessels that carry blood from the heart throughout the body. Hypertension forces the heart to work harder to pump blood and may cause arteries to become narrow or stiff. Having untreated or uncontrolled hypertension can cause heart attacks, strokes, kidney disease, and other problems. A blood pressure reading consists of a higher number over a lower number. Ideally, your blood pressure should be below 120/80. The first ("top") number is called the systolic  pressure. It is a measure of the pressure in your arteries as your heart beats. The second ("bottom") number is called the diastolic pressure. It is a measure of the pressure in your arteries as the heart relaxes. What are the causes? The cause of this condition is not known. What increases the risk? Some risk factors for high blood pressure are under your control. Others are not. Factors you can change  Smoking.  Having type 2 diabetes mellitus, high cholesterol, or both.  Not getting enough exercise or physical activity.  Being overweight.  Having too much fat, sugar, calories, or salt (sodium) in your diet.  Drinking too much alcohol. Factors that are difficult or impossible to change  Having chronic kidney disease.  Having a family history of high blood pressure.  Age. Risk increases with age.  Race. You may be at higher risk if you are African-American.  Gender. Men are at higher risk than women before age 36. After age 54, women are at higher risk than men.  Having obstructive sleep apnea.  Stress. What are the signs or symptoms? Extremely high blood pressure (hypertensive crisis) may cause:  Headache.  Anxiety.  Shortness of breath.  Nosebleed.  Nausea and vomiting.  Severe chest pain.  Jerky movements you cannot control (seizures). How is this diagnosed? This condition is diagnosed by measuring your blood pressure while you are seated, with your arm resting on a surface. The cuff of the blood pressure monitor will be placed directly against the skin of your upper arm at the level of your heart. It should be measured at least twice using the same arm. Certain conditions can cause a difference in blood pressure between your right and left arms. Certain factors can cause blood pressure readings to be lower or higher than normal (elevated) for a short period of time:  When your blood pressure is  higher when you are in a health care provider's office than when  you are at home, this is called white coat hypertension. Most people with this condition do not need medicines.  When your blood pressure is higher at home than when you are in a health care provider's office, this is called masked hypertension. Most people with this condition may need medicines to control blood pressure. If you have a high blood pressure reading during one visit or you have normal blood pressure with other risk factors:  You may be asked to return on a different day to have your blood pressure checked again.  You may be asked to monitor your blood pressure at home for 1 week or longer. If you are diagnosed with hypertension, you may have other blood or imaging tests to help your health care provider understand your overall risk for other conditions. How is this treated? This condition is treated by making healthy lifestyle changes, such as eating healthy foods, exercising more, and reducing your alcohol intake. Your health care provider may prescribe medicine if lifestyle changes are not enough to get your blood pressure under control, and if:  Your systolic blood pressure is above 130.  Your diastolic blood pressure is above 80. Your personal target blood pressure may vary depending on your medical conditions, your age, and other factors. Follow these instructions at home: Eating and drinking   Eat a diet that is high in fiber and potassium, and low in sodium, added sugar, and fat. An example eating plan is called the DASH (Dietary Approaches to Stop Hypertension) diet. To eat this way: ? Eat plenty of fresh fruits and vegetables. Try to fill half of your plate at each meal with fruits and vegetables. ? Eat whole grains, such as whole wheat pasta, brown rice, or whole grain bread. Fill about one quarter of your plate with whole grains. ? Eat or drink low-fat dairy products, such as skim milk or low-fat yogurt. ? Avoid fatty cuts of meat, processed or cured meats, and  poultry with skin. Fill about one quarter of your plate with lean proteins, such as fish, chicken without skin, beans, eggs, and tofu. ? Avoid premade and processed foods. These tend to be higher in sodium, added sugar, and fat.  Reduce your daily sodium intake. Most people with hypertension should eat less than 1,500 mg of sodium a day.  Limit alcohol intake to no more than 1 drink a day for nonpregnant women and 2 drinks a day for men. One drink equals 12 oz of beer, 5 oz of wine, or 1 oz of hard liquor. Lifestyle   Work with your health care provider to maintain a healthy body weight or to lose weight. Ask what an ideal weight is for you.  Get at least 30 minutes of exercise that causes your heart to beat faster (aerobic exercise) most days of the week. Activities may include walking, swimming, or biking.  Include exercise to strengthen your muscles (resistance exercise), such as pilates or lifting weights, as part of your weekly exercise routine. Try to do these types of exercises for 30 minutes at least 3 days a week.  Do not use any products that contain nicotine or tobacco, such as cigarettes and e-cigarettes. If you need help quitting, ask your health care provider.  Monitor your blood pressure at home as told by your health care provider.  Keep all follow-up visits as told by your health care provider. This is important. Medicines  Take over-the-counter and prescription medicines only as told by your health care provider. Follow directions carefully. Blood pressure medicines must be taken as prescribed.  Do not skip doses of blood pressure medicine. Doing this puts you at risk for problems and can make the medicine less effective.  Ask your health care provider about side effects or reactions to medicines that you should watch for. Contact a health care provider if:  You think you are having a reaction to a medicine you are taking.  You have headaches that keep coming back  (recurring).  You feel dizzy.  You have swelling in your ankles.  You have trouble with your vision. Get help right away if:  You develop a severe headache or confusion.  You have unusual weakness or numbness.  You feel faint.  You have severe pain in your chest or abdomen.  You vomit repeatedly.  You have trouble breathing. Summary  Hypertension is when the force of blood pumping through your arteries is too strong. If this condition is not controlled, it may put you at risk for serious complications.  Your personal target blood pressure may vary depending on your medical conditions, your age, and other factors. For most people, a normal blood pressure is less than 120/80.  Hypertension is treated with lifestyle changes, medicines, or a combination of both. Lifestyle changes include weight loss, eating a healthy, low-sodium diet, exercising more, and limiting alcohol. This information is not intended to replace advice given to you by your health care provider. Make sure you discuss any questions you have with your health care provider. Document Released: 04/07/2005 Document Revised: 03/05/2016 Document Reviewed: 03/05/2016 Elsevier Interactive Patient Education  2019 Elsevier Inc.       Edwina Barth, MD [facility]

## 2018-07-29 ENCOUNTER — Other Ambulatory Visit: Payer: Self-pay | Admitting: Emergency Medicine

## 2018-07-29 NOTE — Telephone Encounter (Signed)
Requested Prescriptions  Pending Prescriptions Disp Refills  . albuterol (PROVENTIL HFA;VENTOLIN HFA) 108 (90 Base) MCG/ACT inhaler [Pharmacy Med Name: ALBUTEROL HFA INH (200 PUFFS) 6.7GM] 6.7 g 0    Sig: INHALE 2 PUFFS INTO THE LUNGS EVERY 6 HOURS AS NEEDED FOR WHEEZING OR SHORTNESS OF BREATH     Pulmonology:  Beta Agonists Failed - 07/29/2018  4:20 PM      Failed - One inhaler should last at least one month. If the patient is requesting refills earlier, contact the patient to check for uncontrolled symptoms.      Passed - Valid encounter within last 12 months    Recent Outpatient Visits          3 weeks ago Uncontrolled hypertension   Primary Care at [facility], [place], [place]   1 month ago Uncontrolled hypertension   Primary Care at [facility], Eilleen Kempf, MD   2 months ago Uncontrolled hypertension   Primary Care at [facility], Eilleen Kempf, MD   3 months ago Uncontrolled hypertension   Primary Care at [facility], Eilleen Kempf, MD   4 months ago Uncontrolled hypertension   Primary Care at [facility], Eilleen Kempf, MD      Future Appointments            In 5 months Sagardia, Eilleen Kempf, MD Primary Care at [place], [facility]

## 2018-08-16 ENCOUNTER — Ambulatory Visit: Payer: Self-pay

## 2018-08-16 NOTE — Telephone Encounter (Signed)
Pt called to report that she has had a productive cough at night.  She has a runny nose.  She states the cough keeps her up until she cough up the phlegm. She states that the mucus is yellow. She has no fever.  She called because she is starting to feel SOB with activity during the day. No wheezing. Hx is HTN.  Care advice read to patient. Pt verbalized understanding of all instructions.  Call was transferred to office for appointment scheduling.   Reason for Disposition . [1] Continuous (nonstop) coughing interferes with work or school AND [2] no improvement using cough treatment per Care Advice  Answer Assessment - Initial Assessment Questions 1. ONSET: "When did the cough begin?"      1 week ago 2. SEVERITY: "How bad is the cough today?"      Keeps her up at night 3. RESPIRATORY DISTRESS: "Describe your breathing."      SOB during the day 4. FEVER: "Do you have a fever?" If so, ask: "What is your temperature, how was it measured, and when did it start?"    No 5. SPUTUM: "Describe the color of your sputum" (clear, white, yellow, green)     yellow 6. HEMOPTYSIS: "Are you coughing up any blood?" If so ask: "How much?" (flecks, streaks, tablespoons, etc.)    No 7. CARDIAC HISTORY: "Do you have any history of heart disease?" (e.g., heart attack, congestive heart failure)      no 8. LUNG HISTORY: "Do you have any history of lung disease?"  (e.g., pulmonary embolus, asthma, emphysema)    no 9. PE RISK FACTORS: "Do you have a history of blood clots?" (or: recent major surgery, recent prolonged travel, bedridden)     No 10. OTHER SYMPTOMS: "Do you have any other symptoms?" (e.g., runny nose, wheezing, chest pain)      Runny nose, 11. PREGNANCY: "Is there any chance you are pregnant?" "When was your last menstrual period?"     No 12. TRAVEL: "Have you traveled out of the country in the last month?" (e.g., travel history, exposures)       No  Protocols used: COUGH - ACUTE PRODUCTIVE-A-AH

## 2018-08-17 ENCOUNTER — Other Ambulatory Visit: Payer: Self-pay

## 2018-08-17 ENCOUNTER — Telehealth (INDEPENDENT_AMBULATORY_CARE_PROVIDER_SITE_OTHER): Payer: 59 | Admitting: Emergency Medicine

## 2018-08-17 ENCOUNTER — Encounter: Payer: Self-pay | Admitting: Emergency Medicine

## 2018-08-17 DIAGNOSIS — R059 Cough, unspecified: Secondary | ICD-10-CM

## 2018-08-17 DIAGNOSIS — R062 Wheezing: Secondary | ICD-10-CM | POA: Diagnosis not present

## 2018-08-17 DIAGNOSIS — R05 Cough: Secondary | ICD-10-CM

## 2018-08-17 DIAGNOSIS — Z8709 Personal history of other diseases of the respiratory system: Secondary | ICD-10-CM | POA: Diagnosis not present

## 2018-08-17 DIAGNOSIS — J22 Unspecified acute lower respiratory infection: Secondary | ICD-10-CM

## 2018-08-17 DIAGNOSIS — Z8679 Personal history of other diseases of the circulatory system: Secondary | ICD-10-CM

## 2018-08-17 MED ORDER — BENZONATATE 200 MG PO CAPS: 200.0000 mg | ORAL_CAPSULE | Freq: Two times a day (BID) | ORAL | 0 refills | Status: DC | PRN

## 2018-08-17 MED ORDER — PREDNISONE 20 MG PO TABS: 40.0000 mg | ORAL_TABLET | Freq: Every day | ORAL | 0 refills | Status: AC

## 2018-08-17 MED ORDER — DM-GUAIFENESIN ER 30-600 MG PO TB12: 1.0000 | ORAL_TABLET | Freq: Two times a day (BID) | ORAL | 0 refills | Status: AC

## 2018-08-17 MED ORDER — HYDROCODONE-HOMATROPINE 5-1.5 MG/5ML PO SYRP
5.0000 mL | ORAL_SOLUTION | Freq: Every evening | ORAL | 0 refills | Status: DC | PRN
Start: 2018-08-17 — End: 2018-09-27

## 2018-08-17 MED ORDER — AZITHROMYCIN 250 MG PO TABS: ORAL_TABLET | ORAL | 0 refills | Status: DC

## 2018-08-17 NOTE — Progress Notes (Signed)
Called patient to triage for 11:00 am appointment. Patient states she has shortness of breath with coughing the that is not productive. Patient states she does have a history of bronchitis. Patient states she have never smoked.

## 2018-08-17 NOTE — Progress Notes (Signed)
Telemedicine Encounter- SOAP NOTE Established Patient  This telephone encounter was conducted with the patient's (or proxy's) verbal consent via audio telecommunications: yes/no: Yes Patient was instructed to have this encounter in a suitably private space; and to only have persons present to whom they give permission to participate. In addition, patient identity was confirmed by use of name plus two identifiers (DOB and address).  I discussed the limitations, risks, security and privacy concerns of performing an evaluation and management service by telephone and the availability of in person appointments. I also discussed with the patient that there may be a patient responsible charge related to this service. The patient expressed understanding and agreed to proceed.  I spent a total of TIME; 0 MIN TO 60 MIN: 15 minutes talking with the patient or their proxy.  No chief complaint on file. Cough  Subjective   Kathleen Cameron is a 53 y.o. female established patient. Telephone visit today complaining of one-week history of productive cough, mostly at night with intermittent shortness of breath and wheezing.  Denies fever or chills.  Denies chest pain.  Has a history of hypertension and asthma.  Has been using albuterol inhaler.  No other sick family members at home.  Denies contact with coronavirus patient.  No other significant symptoms.  HPI   Patient Active Problem List   Diagnosis Date Noted  . Dyslipidemia 07/07/2018  . Uncontrolled hypertension 03/10/2018    Past Medical History:  Diagnosis Date  . Hypertension     Current Outpatient Medications  Medication Sig Dispense Refill  . albuterol (PROVENTIL HFA;VENTOLIN HFA) 108 (90 Base) MCG/ACT inhaler INHALE 2 PUFFS INTO THE LUNGS EVERY 6 HOURS AS NEEDED FOR WHEEZING OR SHORTNESS OF BREATH 6.7 g 0  . amLODipine (NORVASC) 10 MG tablet Take 1 tablet (10 mg total) by mouth daily. 90 tablet 3  . hydrochlorothiazide  (HYDRODIURIL) 25 MG tablet Take 1 tablet (25 mg total) by mouth daily. 90 tablet 3  . lisinopril (PRINIVIL,ZESTRIL) 40 MG tablet Take 1 tablet (40 mg total) by mouth daily. 90 tablet 3  . rosuvastatin (CRESTOR) 10 MG tablet Take 1 tablet (10 mg total) by mouth daily. 90 tablet 3   No current facility-administered medications for this visit.     No Known Allergies  Social History   Socioeconomic History  . Marital status: Single    Spouse name: Not on file  . Number of children: Not on file  . Years of education: Not on file  . Highest education level: Not on file  Occupational History  . Not on file  Social Needs  . Financial resource strain: Not on file  . Food insecurity:    Worry: Not on file    Inability: Not on file  . Transportation needs:    Medical: Not on file    Non-medical: Not on file  Tobacco Use  . Smoking status: Never Smoker  . Smokeless tobacco: Never Used  Substance and Sexual Activity  . Alcohol use: Yes    Alcohol/week: 2.0 standard drinks    Types: 2 Cans of beer per week  . Drug use: Never  . Sexual activity: Not on file  Lifestyle  . Physical activity:    Days per week: Not on file    Minutes per session: Not on file  . Stress: Not on file  Relationships  . Social connections:    Talks on phone: Not on file    Gets together: Not on file  Attends religious service: Not on file    Active member of club or organization: Not on file    Attends meetings of clubs or organizations: Not on file    Relationship status: Not on file  . Intimate partner violence:    Fear of current or ex partner: Not on file    Emotionally abused: Not on file    Physically abused: Not on file    Forced sexual activity: Not on file  Other Topics Concern  . Not on file  Social History Narrative  . Not on file    Review of Systems  Constitutional: Negative.  Negative for chills and fever.  HENT: Negative.  Negative for congestion, nosebleeds and sore throat.    Eyes: Negative.   Respiratory: Positive for cough, sputum production, shortness of breath and wheezing. Negative for hemoptysis.   Cardiovascular: Negative.  Negative for chest pain and palpitations.  Gastrointestinal: Negative.  Negative for abdominal pain, diarrhea, nausea and vomiting.  Genitourinary: Negative.  Negative for dysuria and hematuria.  Musculoskeletal: Negative.   Skin: Negative.  Negative for rash.  Neurological: Negative for dizziness and headaches.  Endo/Heme/Allergies: Negative.   All other systems reviewed and are negative.   Objective   Vitals as reported by the patient: None available There were no vitals filed for this visit. Awake and oriented x3 in no apparent respiratory distress. There are no diagnoses linked to this encounter. Diagnoses and all orders for this visit:  Cough -     benzonatate (TESSALON) 200 MG capsule; Take 1 capsule (200 mg total) by mouth 2 (two) times daily as needed for cough. -     HYDROcodone-homatropine (HYCODAN) 5-1.5 MG/5ML syrup; Take 5 mLs by mouth at bedtime as needed for cough. -     dextromethorphan-guaiFENesin (MUCINEX DM) 30-600 MG 12hr tablet; Take 1 tablet by mouth 2 (two) times daily for 7 days.  Lower respiratory infection -     azithromycin (ZITHROMAX) 250 MG tablet; Sig as indicated  History of asthma -     predniSONE (DELTASONE) 20 MG tablet; Take 2 tablets (40 mg total) by mouth daily with breakfast for 5 days.  Wheezing -     predniSONE (DELTASONE) 20 MG tablet; Take 2 tablets (40 mg total) by mouth daily with breakfast for 5 days.  History of hypertension     I discussed the assessment and treatment plan with the patient. The patient was provided an opportunity to ask questions and all were answered. The patient agreed with the plan and demonstrated an understanding of the instructions.   The patient was advised to call back or seek an in-person evaluation if the symptoms worsen or if the condition fails  to improve as anticipated.  I provided 15 minutes of non-face-to-face time during this encounter.  [name], MD  Primary Care at [facility]

## 2018-08-24 ENCOUNTER — Other Ambulatory Visit: Payer: Self-pay | Admitting: Emergency Medicine

## 2018-08-24 NOTE — Telephone Encounter (Signed)
Requested medication (s) are due for refill today: Early  Requested medication (s) are on the active medication list: Yes  Last refill:  07/29/18  Future visit scheduled: Yes  Notes to clinic:  Left message for pt. To call back if she is not better.    Requested Prescriptions  Pending Prescriptions Disp Refills   albuterol (VENTOLIN HFA) 108 (90 Base) MCG/ACT inhaler [Pharmacy Med Name: ALBUTEROL HFA INH (200 PUFFS) 6.7GM] 6.7 g 0    Sig: INHALE 2 PUFFS INTO THE LUNGS EVERY 6 HOURS AS NEEDED FOR WHEEZING OR SHORTNESS OF BREATH     Pulmonology:  Beta Agonists Failed - 08/24/2018 11:04 AM      Failed - One inhaler should last at least one month. If the patient is requesting refills earlier, contact the patient to check for uncontrolled symptoms.      Passed - Valid encounter within last 12 months    Recent Outpatient Visits          1 month ago Uncontrolled hypertension   Primary Care at [facility], Eilleen Kempf, MD   2 months ago Uncontrolled hypertension   Primary Care at [facility], Eilleen Kempf, MD   3 months ago Uncontrolled hypertension   Primary Care at [facility], Eilleen Kempf, MD   4 months ago Uncontrolled hypertension   Primary Care at [facility], Eilleen Kempf, MD   5 months ago Uncontrolled hypertension   Primary Care at [facility], Eilleen Kempf, MD      Future Appointments            In 4 months Sagardia, Eilleen Kempf, MD Primary Care at [place], [facility]

## 2018-09-14 ENCOUNTER — Ambulatory Visit (INDEPENDENT_AMBULATORY_CARE_PROVIDER_SITE_OTHER): Payer: 59 | Admitting: Emergency Medicine

## 2018-09-14 ENCOUNTER — Telehealth: Payer: Self-pay | Admitting: Emergency Medicine

## 2018-09-14 ENCOUNTER — Other Ambulatory Visit: Payer: Self-pay

## 2018-09-14 ENCOUNTER — Encounter: Payer: Self-pay | Admitting: Emergency Medicine

## 2018-09-14 ENCOUNTER — Ambulatory Visit
Admission: RE | Admit: 2018-09-14 | Discharge: 2018-09-14 | Disposition: A | Discharge status: Home / Self Care | Payer: No Typology Code available for payment source | Source: Ambulatory Visit | Attending: Emergency Medicine | Admitting: Emergency Medicine

## 2018-09-14 VITALS — BP 151/90 | HR 92 | Temp 98.3°F | Resp 18 | Ht 60.05 in | Wt 106.4 lb

## 2018-09-14 DIAGNOSIS — R0600 Dyspnea, unspecified: Secondary | ICD-10-CM

## 2018-09-14 DIAGNOSIS — R918 Other nonspecific abnormal finding of lung field: Secondary | ICD-10-CM

## 2018-09-14 DIAGNOSIS — I10 Essential (primary) hypertension: Secondary | ICD-10-CM | POA: Diagnosis not present

## 2018-09-14 DIAGNOSIS — R0609 Other forms of dyspnea: Secondary | ICD-10-CM

## 2018-09-14 MED ORDER — FUROSEMIDE 40 MG PO TABS: 40.0000 mg | ORAL_TABLET | Freq: Every day | ORAL | 3 refills | Status: DC

## 2018-09-14 NOTE — Progress Notes (Signed)
Kathleen Cameron 53 y.o.   Chief Complaint  Patient presents with  . Shortness of Breath    getting out of breathe by doing simple tasks x3month cant lay on left side     HISTORY OF PRESENT ILLNESS: This is a 53 y.o. female complaining of dyspnea on exertion that started about 1 month ago.  Difficult to lay on her left side.  Has history of uncontrolled hypertension on medication and doing better.  Shortness of Breath  This is a new problem. The current episode started 1 to 4 weeks ago. The problem occurs intermittently. The problem has been gradually worsening. Pertinent negatives include no abdominal pain, chest pain, claudication, fever, headaches, hemoptysis, leg pain, leg swelling, neck pain, orthopnea, PND, rash, sore throat, sputum production, swollen glands, syncope, vomiting or wheezing. The symptoms are aggravated by any activity. The patient has no known risk factors for DVT/PE. She has tried beta agonist inhalers for the symptoms. The treatment provided mild relief. There is no history of CAD, chronic lung disease, COPD, DVT, a heart failure, PE or pneumonia.     Prior to Admission medications   Medication Sig Start Date End Date Taking? Authorizing Provider  albuterol (VENTOLIN HFA) 108 (90 Base) MCG/ACT inhaler INHALE 2 PUFFS INTO THE LUNGS EVERY 6 HOURS AS NEEDED FOR WHEEZING OR SHORTNESS OF BREATH 08/25/18  Yes Izea Livolsi, Eilleen Kempf, MD  amLODipine (NORVASC) 10 MG tablet Take 1 tablet (10 mg total) by mouth daily. 07/07/18  Yes Saleh Ulbrich, Eilleen Kempf, MD  azithromycin [facility]) 250 MG tablet Sig as indicated 08/17/18  Yes Thayer Embleton, Eilleen Kempf, MD  benzonatate (TESSALON) 200 MG capsule Take 1 capsule (200 mg total) by mouth 2 (two) times daily as needed for cough. 08/17/18  Yes Iyanla Eilers, Eilleen Kempf, MD  hydrochlorothiazide (HYDRODIURIL) 25 MG tablet Take 1 tablet (25 mg total) by mouth daily. 07/07/18  Yes Tymier Lindholm, Eilleen Kempf, MD  HYDROcodone-homatropine [facility]) 5-1.5  MG/5ML syrup Take 5 mLs by mouth at bedtime as needed for cough. 08/17/18  Yes Rosely Fernandez, Eilleen Kempf, MD  lisinopril (PRINIVIL,ZESTRIL) 40 MG tablet Take 1 tablet (40 mg total) by mouth daily. 07/07/18 10/05/18 Yes Gussie Towson, Eilleen Kempf, MD  rosuvastatin (CRESTOR) 10 MG tablet Take 1 tablet (10 mg total) by mouth daily. 07/07/18  Yes SagardiaEilleen Kempf, MD    No Known Allergies  Patient Active Problem List   Diagnosis Date Noted  . Dyslipidemia 07/07/2018  . Uncontrolled hypertension 03/10/2018    Past Medical History:  Diagnosis Date  . Hypertension     No past surgical history on file.  Social History   Socioeconomic History  . Marital status: Single    Spouse name: Not on file  . Number of children: Not on file  . Years of education: Not on file  . Highest education level: Not on file  Occupational History  . Not on file  Social Needs  . Financial resource strain: Not on file  . Food insecurity:    Worry: Not on file    Inability: Not on file  . Transportation needs:    Medical: Not on file    Non-medical: Not on file  Tobacco Use  . Smoking status: Never Smoker  . Smokeless tobacco: Never Used  Substance and Sexual Activity  . Alcohol use: Yes    Alcohol/week: 2.0 standard drinks    Types: 2 Cans of beer per week  . Drug use: Never  . Sexual activity: Not on file  Lifestyle  .  Physical activity:    Days per week: Not on file    Minutes per session: Not on file  . Stress: Not on file  Relationships  . Social connections:    Talks on phone: Not on file    Gets together: Not on file    Attends religious service: Not on file    Active member of club or organization: Not on file    Attends meetings of clubs or organizations: Not on file    Relationship status: Not on file  . Intimate partner violence:    Fear of current or ex partner: Not on file    Emotionally abused: Not on file    Physically abused: Not on file    Forced sexual activity: Not on file   Other Topics Concern  . Not on file  Social History Narrative  . Not on file    No family history on file.   Review of Systems  Constitutional: Negative.  Negative for chills and fever.  HENT: Negative.  Negative for sore throat.   Eyes: Negative.   Respiratory: Positive for shortness of breath. Negative for hemoptysis, sputum production and wheezing.   Cardiovascular: Negative.  Negative for chest pain, palpitations, orthopnea, claudication, leg swelling, syncope and PND.  Gastrointestinal: Negative.  Negative for abdominal pain, diarrhea, nausea and vomiting.  Genitourinary: Negative.   Musculoskeletal: Negative for back pain and neck pain.  Skin: Negative.  Negative for rash.  Neurological: Negative for dizziness and headaches.  Endo/Heme/Allergies: Negative.   All other systems reviewed and are negative.  Vitals:   09/14/18 1117  BP: (!) 151/90  Pulse: 92  Resp: 18  Temp: 98.3 F (36.8 C)  SpO2: 93%     Physical Exam Vitals signs reviewed.  Constitutional:      Appearance: Normal appearance.  HENT:     Head: Normocephalic and atraumatic.     Nose: Nose normal.     Mouth/Throat:     Mouth: Mucous membranes are moist.     Pharynx: Oropharynx is clear.  Eyes:     Extraocular Movements: Extraocular movements intact.     Conjunctiva/sclera: Conjunctivae normal.     Pupils: Pupils are equal, round, and reactive to light.  Neck:     Musculoskeletal: Normal range of motion and neck supple.  Cardiovascular:     Rate and Rhythm: Normal rate and regular rhythm.     Heart sounds: Normal heart sounds.  Pulmonary:     Effort: Pulmonary effort is normal. No tachypnea.     Breath sounds: Examination of the right-lower field reveals rales. Examination of the left-lower field reveals rales. Decreased breath sounds (Both bases) and rales present. No wheezing or rhonchi.  Abdominal:     General: Bowel sounds are normal. There is no distension.     Palpations: Abdomen is  soft.     Tenderness: There is no abdominal tenderness.  Musculoskeletal: Normal range of motion.  Skin:    General: Skin is warm and dry.     Capillary Refill: Capillary refill takes less than 2 seconds.  Neurological:     General: No focal deficit present.     Mental Status: She is alert and oriented to person, place, and time.  Psychiatric:        Mood and Affect: Mood normal.        Behavior: Behavior normal.     Dg Chest 2 View  Result Date: 09/14/2018 CLINICAL DATA:  Dyspnea on exertion. EXAM: CHEST -  2 VIEW COMPARISON:  11/12/2008. FINDINGS: Mediastinum and hilar structures normal. Heart size normal. Three pulmonary nodules are noted projected over the right upper lung. These appear to be new. Nonenhanced chest CT suggested to further evaluate. No pleural effusion or pneumothorax. No acute bony abnormality. IMPRESSION: Three pulmonary nodules are noted projected over the right upper lung. These appear to be new. Nonenhanced chest CT is suggested to further evaluate. Electronically Signed   By: Maisie Fushomas  Register   On: 09/14/2018 13:22     ASSESSMENT & PLAN: Kathleen LankJacqueline was seen today for shortness of breath.  Diagnoses and all orders for this visit:  Dyspnea on exertion -     DG Chest 2 View; Future -     furosemide (LASIX) 40 MG tablet; Take 1 tablet (40 mg total) by mouth daily. -     CBC with Differential/Platelet -     Comprehensive metabolic panel -     Brain natriuretic peptide  Essential hypertension Comments: Suspected hypertensive heart disease  Abnormal findings on diagnostic imaging of lung -     CT CHEST LUNG CA SCREEN LOW DOSE W/O CM; Future  Multiple pulmonary nodules -     CT CHEST LUNG CA SCREEN LOW DOSE W/O CM; Future    Patient Instructions   Please get Chest Xray at [address] [place]     If you have lab work done today you will be contacted with your lab results within the next 2 weeks.  If you have not heard from us then please  contact us. The fastest way to get your results is to register for My Chart.   IF you received an x-ray today, you will receive an invoice from [facility]. Please contact [facility] at [phone] with questions or concerns regarding your invoice.   IF you received labwork today, you will receive an invoice from [facility]. Please contact [facility] at [phone] with questions or concerns regarding your invoice.   Our billing staff will not be able to assist you with questions regarding bills from these companies.  You will be contacted with the lab results as soon as they are available. The fastest way to get your results is to activate your My Chart account. Instructions are located on the last page of this paperwork. If you have not heard from us regarding the results in 2 weeks, please contact this office.     Shortness of Breath, Adult Shortness of breath means you have trouble breathing. Shortness of breath could be a sign of a medical problem. Follow these instructions at home:   Watch for any changes in your symptoms.  Do not use any products that contain nicotine or tobacco, such as cigarettes, e-cigarettes, and chewing tobacco.  Do not smoke. Smoking can cause shortness of breath. If you need help to quit smoking, ask your doctor.  Avoid things that can make it harder to breathe, such as: ? Mold. ? Dust. ? Air pollution. ? Chemical smells. ? Things that can cause allergy symptoms (allergens), if you have allergies.  Keep your living space clean. Use products that help remove mold and dust.  Rest as needed. Slowly return to your normal activities.  Take over-the-counter and prescription medicines only as told by your doctor. This includes oxygen therapy and inhaled medicines.  Keep all follow-up visits as told by your doctor. This is important. Contact a doctor if:  Your condition does not get better as soon as expected.  You have a  hard time  doing your normal activities, even after you rest.  You have new symptoms. Get help right away if:  Your shortness of breath gets worse.  You have trouble breathing when you are resting.  You feel light-headed or you pass out (faint).  You have a cough that is not helped by medicines.  You cough up blood.  You have pain with breathing.  You have pain in your chest, arms, shoulders, or belly (abdomen).  You have a fever.  You cannot walk up stairs.  You cannot exercise the way you normally do. These symptoms may represent a serious problem that is an emergency. Do not wait to see if the symptoms will go away. Get medical help right away. Call your local emergency services (911 in the [place] Do not drive yourself to the hospital. Summary  Shortness of breath is when you have trouble breathing enough air. It can be a sign of a medical problem.  Avoid things that make it hard for you to breathe, such as smoking, pollution, mold, and dust.  Watch for any changes in your symptoms. Contact your doctor if you do not get better or you get worse. This information is not intended to replace advice given to you by your health care provider. Make sure you discuss any questions you have with your health care provider. Document Released: 09/24/2007 Document Revised: 09/07/2017 Document Reviewed: 09/07/2017 Elsevier Interactive Patient Education  2019 Elsevier Inc.      Edwina Barth, MD [facility]

## 2018-09-14 NOTE — Telephone Encounter (Signed)
Chest x-ray report discussed with patient.  We will schedule CT of chest.

## 2018-09-14 NOTE — Patient Instructions (Addendum)
Please get Chest Xray at [address] [place]     If you have lab work done today you will be contacted with your lab results within the next 2 weeks.  If you have not heard from [place] then please contact us. The fastest way to get your results is to register for My Chart.   IF you received an x-ray today, you will receive an invoice from [facility]. Please contact [facility] at [phone] with questions or concerns regarding your invoice.   IF you received labwork today, you will receive an invoice from [place]. Please contact [facility] at [phone] with questions or concerns regarding your invoice.   Our billing staff will not be able to assist you with questions regarding bills from these companies.  You will be contacted with the lab results as soon as they are available. The fastest way to get your results is to activate your My Chart account. Instructions are located on the last page of this paperwork. If you have not heard from [place] regarding the results in 2 weeks, please contact this office.     Shortness of Breath, Adult Shortness of breath means you have trouble breathing. Shortness of breath could be a sign of a medical problem. Follow these instructions at home:   Watch for any changes in your symptoms.  Do not use any products that contain nicotine or tobacco, such as cigarettes, e-cigarettes, and chewing tobacco.  Do not smoke. Smoking can cause shortness of breath. If you need help to quit smoking, ask your doctor.  Avoid things that can make it harder to breathe, such as: ? Mold. ? Dust. ? Air pollution. ? Chemical smells. ? Things that can cause allergy symptoms (allergens), if you have allergies.  Keep your living space clean. Use products that help remove mold and dust.  Rest as needed. Slowly return to your normal activities.  Take over-the-counter and prescription medicines only as told by your doctor. This includes oxygen  therapy and inhaled medicines.  Keep all follow-up visits as told by your doctor. This is important. Contact a doctor if:  Your condition does not get better as soon as expected.  You have a hard time doing your normal activities, even after you rest.  You have new symptoms. Get help right away if:  Your shortness of breath gets worse.  You have trouble breathing when you are resting.  You feel light-headed or you pass out (faint).  You have a cough that is not helped by medicines.  You cough up blood.  You have pain with breathing.  You have pain in your chest, arms, shoulders, or belly (abdomen).  You have a fever.  You cannot walk up stairs.  You cannot exercise the way you normally do. These symptoms may represent a serious problem that is an emergency. Do not wait to see if the symptoms will go away. Get medical help right away. Call your local emergency services (911 in the [place] Do not drive yourself to the hospital. Summary  Shortness of breath is when you have trouble breathing enough air. It can be a sign of a medical problem.  Avoid things that make it hard for you to breathe, such as smoking, pollution, mold, and dust.  Watch for any changes in your symptoms. Contact your doctor if you do not get better or you get worse. This information is not intended to replace advice given to you by your health care provider. Make sure  you discuss any questions you have with your health care provider. Document Released: 09/24/2007 Document Revised: 09/07/2017 Document Reviewed: 09/07/2017 Elsevier Interactive Patient Education  2019 [employer].

## 2018-09-15 ENCOUNTER — Telehealth: Payer: Self-pay | Admitting: Emergency Medicine

## 2018-09-15 LAB — CBC WITH DIFFERENTIAL/PLATELET
Basophils Absolute: 0.1 10*3/uL (ref 0.0–0.2)
Basos: 1 %
EOS (ABSOLUTE): 0.3 10*3/uL (ref 0.0–0.4)
Eos: 7 %
Hematocrit: 39.3 % (ref 34.0–46.6)
Hemoglobin: 13.4 g/dL (ref 11.1–15.9)
Immature Grans (Abs): 0 10*3/uL (ref 0.0–0.1)
Immature Granulocytes: 0 %
Lymphocytes Absolute: 1.7 10*3/uL (ref 0.7–3.1)
Lymphs: 37 %
MCH: 28.6 pg (ref 26.6–33.0)
MCHC: 34.1 g/dL (ref 31.5–35.7)
MCV: 84 fL (ref 79–97)
Monocytes Absolute: 0.5 10*3/uL (ref 0.1–0.9)
Monocytes: 10 %
Neutrophils Absolute: 2.1 10*3/uL (ref 1.4–7.0)
Neutrophils: 45 %
Platelets: 303 10*3/uL (ref 150–450)
RBC: 4.68 x10E6/uL (ref 3.77–5.28)
RDW: 12.9 % (ref 11.7–15.4)
WBC: 4.6 10*3/uL (ref 3.4–10.8)

## 2018-09-15 LAB — COMPREHENSIVE METABOLIC PANEL
ALT: 11 IU/L (ref 0–32)
AST: 16 IU/L (ref 0–40)
Albumin/Globulin Ratio: 1.6 (ref 1.2–2.2)
Albumin: 4.7 g/dL (ref 3.8–4.9)
Alkaline Phosphatase: 56 IU/L (ref 39–117)
BUN/Creatinine Ratio: 16 (ref 9–23)
BUN: 21 mg/dL (ref 6–24)
Bilirubin Total: 0.4 mg/dL (ref 0.0–1.2)
CO2: 28 mmol/L (ref 20–29)
Calcium: 10 mg/dL (ref 8.7–10.2)
Chloride: 91 mmol/L — ABNORMAL LOW (ref 96–106)
Creatinine, Ser: 1.34 mg/dL — ABNORMAL HIGH (ref 0.57–1.00)
GFR calc Af Amer: 51 mL/min/{1.73_m2} — ABNORMAL LOW (ref >59)
GFR calc non Af Amer: 45 mL/min/{1.73_m2} — ABNORMAL LOW (ref >59)
Globulin, Total: 2.9 g/dL (ref 1.5–4.5)
Glucose: 89 mg/dL (ref 65–99)
Potassium: 4.1 mmol/L (ref 3.5–5.2)
Sodium: 135 mmol/L (ref 134–144)
Total Protein: 7.6 g/dL (ref 6.0–8.5)

## 2018-09-15 LAB — BRAIN NATRIURETIC PEPTIDE: BNP: 7.2 pg/mL (ref 0.0–100.0)

## 2018-09-15 NOTE — Telephone Encounter (Signed)
Blood results discussed with patient.  Renal insufficiency noted.  Advised to stop both furosemide and HCTZ.  Will schedule CT of the chest as recommended yesterday, monitor symptoms, follow-up in 2 weeks, repeat blood test then.

## 2018-09-22 ENCOUNTER — Other Ambulatory Visit: Payer: Self-pay | Admitting: Emergency Medicine

## 2018-09-23 ENCOUNTER — Emergency Department (HOSPITAL_COMMUNITY)
Admission: EM | Admit: 2018-09-23 | Discharge: 2018-09-23 | Disposition: A | Discharge status: Home / Self Care | Payer: 59 | Attending: Emergency Medicine | Admitting: Emergency Medicine

## 2018-09-23 ENCOUNTER — Emergency Department (HOSPITAL_COMMUNITY): Payer: 59

## 2018-09-23 ENCOUNTER — Other Ambulatory Visit: Payer: Self-pay

## 2018-09-23 ENCOUNTER — Ambulatory Visit: Payer: Self-pay

## 2018-09-23 ENCOUNTER — Encounter (HOSPITAL_COMMUNITY): Payer: Self-pay

## 2018-09-23 DIAGNOSIS — R0602 Shortness of breath: Secondary | ICD-10-CM | POA: Diagnosis present

## 2018-09-23 DIAGNOSIS — R590 Localized enlarged lymph nodes: Secondary | ICD-10-CM

## 2018-09-23 DIAGNOSIS — Z20828 Contact with and (suspected) exposure to other viral communicable diseases: Secondary | ICD-10-CM | POA: Diagnosis not present

## 2018-09-23 DIAGNOSIS — R0600 Dyspnea, unspecified: Secondary | ICD-10-CM

## 2018-09-23 DIAGNOSIS — R0609 Other forms of dyspnea: Secondary | ICD-10-CM | POA: Insufficient documentation

## 2018-09-23 LAB — CBC WITH DIFFERENTIAL/PLATELET
Abs Immature Granulocytes: 0 10*3/uL (ref 0.00–0.07)
Basophils Absolute: 0 10*3/uL (ref 0.0–0.1)
Basophils Relative: 1 %
Eosinophils Absolute: 0.2 10*3/uL (ref 0.0–0.5)
Eosinophils Relative: 4 %
HCT: 38 % (ref 36.0–46.0)
Hemoglobin: 12.5 g/dL (ref 12.0–15.0)
Immature Granulocytes: 0 %
Lymphocytes Relative: 20 %
Lymphs Abs: 0.9 10*3/uL (ref 0.7–4.0)
MCH: 28.9 pg (ref 26.0–34.0)
MCHC: 32.9 g/dL (ref 30.0–36.0)
MCV: 87.8 fL (ref 80.0–100.0)
Monocytes Absolute: 0.3 10*3/uL (ref 0.1–1.0)
Monocytes Relative: 8 %
Neutro Abs: 3 10*3/uL (ref 1.7–7.7)
Neutrophils Relative %: 67 %
Platelets: 223 10*3/uL (ref 150–400)
RBC: 4.33 MIL/uL (ref 3.87–5.11)
RDW: 12.7 % (ref 11.5–15.5)
WBC: 4.4 10*3/uL (ref 4.0–10.5)
nRBC: 0 % (ref 0.0–0.2)

## 2018-09-23 LAB — BRAIN NATRIURETIC PEPTIDE: B Natriuretic Peptide: 22.5 pg/mL (ref 0.0–100.0)

## 2018-09-23 LAB — SARS CORONAVIRUS 2: SARS Coronavirus 2: NOT DETECTED

## 2018-09-23 LAB — BASIC METABOLIC PANEL
Anion gap: 11 (ref 5–15)
BUN: 11 mg/dL (ref 6–20)
CO2: 27 mmol/L (ref 22–32)
Calcium: 9.6 mg/dL (ref 8.9–10.3)
Chloride: 102 mmol/L (ref 98–111)
Creatinine, Ser: 1.21 mg/dL — ABNORMAL HIGH (ref 0.44–1.00)
GFR calc Af Amer: 58 mL/min — ABNORMAL LOW (ref >60)
GFR calc non Af Amer: 50 mL/min — ABNORMAL LOW (ref >60)
Glucose, Bld: 95 mg/dL (ref 70–99)
Potassium: 3.8 mmol/L (ref 3.5–5.1)
Sodium: 140 mmol/L (ref 135–145)

## 2018-09-23 LAB — D-DIMER, QUANTITATIVE: D-Dimer, Quant: 1.41 ug/mL-FEU — ABNORMAL HIGH (ref 0.00–0.50)

## 2018-09-23 MED ORDER — IOHEXOL 350 MG/ML SOLN
100.0000 mL | Freq: Once | INTRAVENOUS | Status: AC | PRN
  Administered 2018-09-23: 100 mL via INTRAVENOUS

## 2018-09-23 NOTE — ED Provider Notes (Signed)
[facility] EMERGENCY DEPARTMENT Provider Note   CSN: [id number] Arrival date & time: 09/23/18  1031    History   Chief Complaint Chief Complaint  Patient presents with  . Shortness of Breath    HPI Kathleen Cameron is a 53 y.o. female.     HPI 53 year old female comes in a chief complaint of shortness of breath.  She has history of hypertension.  She reports that over the past 1 month or so she has been having worsening exertional shortness of breath.  Now walking from her room to the kitchen will get a short of breath.  She denies any associated orthopnea-like symptoms or lower extremity swelling.  There is also no chest pain.  Patient denies any nausea, vomiting, fevers, chills.  She has seen her PCP and was advised that her x-ray was unremarkable.  It appears that a CT scan is scheduled.  Patient does admit to smoking.  Pt has no hx of PE, DVT and denies any exogenous hormone (testosterone / estrogen) use, long distance travels or surgery in the past 6 weeks, active cancer, recent immobilization.   Past Medical History:  Diagnosis Date  . Hypertension     Patient Active Problem List   Diagnosis Date Noted  . Dyslipidemia 07/07/2018  . Uncontrolled hypertension 03/10/2018    History reviewed. No pertinent surgical history.   OB History   No obstetric history on file.      Home Medications    Prior to Admission medications   Medication Sig Start Date End Date Taking? Authorizing Provider  albuterol (VENTOLIN HFA) 108 (90 Base) MCG/ACT inhaler INHALE 2 PUFFS INTO THE LUNGS EVERY 6 HOURS AS NEEDED FOR WHEEZING OR SHORTNESS OF BREATH Patient taking differently: Inhale 2 puffs into the lungs every 4 (four) hours as needed for wheezing or shortness of breath.  09/22/18  Yes Sagardia, Eilleen Kempf, MD  amLODipine (NORVASC) 10 MG tablet Take 1 tablet (10 mg total) by mouth daily. 07/07/18  Yes Sagardia, Eilleen Kempf, MD  furosemide (LASIX) 40 MG tablet  Take 1 tablet (40 mg total) by mouth daily. 09/14/18  Yes Sagardia, Eilleen Kempf, MD  hydrochlorothiazide (HYDRODIURIL) 25 MG tablet Take 1 tablet (25 mg total) by mouth daily. 07/07/18  Yes Sagardia, Eilleen Kempf, MD  rosuvastatin (CRESTOR) 10 MG tablet Take 1 tablet (10 mg total) by mouth daily. 07/07/18  Yes [name], MD  azithromycin [facility]) 250 MG tablet Sig as indicated Patient not taking: Reported on 09/23/2018 08/17/18   [name], MD  benzonatate (TESSALON) 200 MG capsule Take 1 capsule (200 mg total) by mouth 2 (two) times daily as needed for cough. Patient not taking: Reported on 09/23/2018 08/17/18   [name], MD  HYDROcodone-homatropine [facility]) 5-1.5 MG/5ML syrup Take 5 mLs by mouth at bedtime as needed for cough. Patient not taking: Reported on 09/23/2018 08/17/18   [name], MD  lisinopril (PRINIVIL,ZESTRIL) 40 MG tablet Take 1 tablet (40 mg total) by mouth daily. Patient not taking: Reported on 09/23/2018 07/07/18 10/05/18  [name], MD    Family History History reviewed. No pertinent family history.  Social History Social History   Tobacco Use  . Smoking status: Never Smoker  . Smokeless tobacco: Never Used  Substance Use Topics  . Alcohol use: Yes    Alcohol/week: 2.0 standard drinks    Types: 2 Cans of beer per week  . Drug use: Never     Allergies  Patient has no known allergies.   Review of Systems Review of Systems  Constitutional: Positive for activity change.  Respiratory: Positive for shortness of breath.   All other systems reviewed and are negative.    Physical Exam Updated Vital Signs BP (!) 174/114   Pulse 93   Temp 97.8 F (36.6 C) (Oral)   Resp (!) 22   Ht 5\' 1"  (1.549 m)   Wt 52.6 kg   SpO2 97%   BMI 21.92 kg/m   Physical Exam Vitals signs and nursing note reviewed.  Constitutional:      Appearance: She is well-developed.  HENT:     Head: Normocephalic and atraumatic.   Neck:     Musculoskeletal: Normal range of motion and neck supple.  Cardiovascular:     Rate and Rhythm: Normal rate.  Pulmonary:     Effort: Pulmonary effort is normal.     Breath sounds: No decreased breath sounds, wheezing or rhonchi.  Abdominal:     General: Bowel sounds are normal.  Musculoskeletal:     Right lower leg: She exhibits no tenderness. No edema.     Left lower leg: She exhibits no tenderness. No edema.  Skin:    General: Skin is warm and dry.  Neurological:     Mental Status: She is alert and oriented to person, place, and time.      ED Treatments / Results  Labs (all labs ordered are listed, but only abnormal results are displayed) Labs Reviewed  BASIC METABOLIC PANEL - Abnormal; Notable for the following components:      Result Value   Creatinine, Ser 1.21 (*)    GFR calc non Af Amer 50 (*)    GFR calc Af Amer 58 (*)    All other components within normal limits  D-DIMER, QUANTITATIVE (NOT AT [facility]) - Abnormal; Notable for the following components:   D-Dimer, Quant 1.41 (*)    All other components within normal limits  SARS CORONAVIRUS 2  CBC WITH DIFFERENTIAL/PLATELET  BRAIN NATRIURETIC PEPTIDE    EKG EKG Interpretation  Date/Time:  Thursday September 23 2018 10:39:56 EDT Ventricular Rate:  97 PR Interval:    QRS Duration: 75 QT Interval:  345 QTC Calculation: 439 R Axis:   18 Text Interpretation:  Sinus rhythm Anterior infarct, old Nonspecific T abnormalities, lateral leads Nonspecific ST and T wave abnormality No significant change since last tracing Confirmed by Derwood Kaplan [phone]) on 09/23/2018 3:33:40 PM   Radiology Ct Angio Chest Pe W/cm &/or Wo Cm  Result Date: 09/23/2018 CLINICAL DATA:  53 year old female with shortness of breath, abnormal D-dimer. EXAM: CT ANGIOGRAPHY CHEST WITH CONTRAST TECHNIQUE: Multidetector CT imaging of the chest was performed using the standard protocol during bolus administration of intravenous contrast.  Multiplanar CT image reconstructions and MIPs were obtained to evaluate the vascular anatomy. CONTRAST:  OMNIPAQUE IOHEXOL 350 MG/ML SOLN COMPARISON:  Portable chest earlier today. FINDINGS: Cardiovascular: Excellent contrast bolus timing in the pulmonary arterial tree. No focal filling defect identified in the pulmonary arteries to suggest acute pulmonary embolism. No cardiomegaly. No pericardial effusion. Tortuous thoracic aorta. No calcified coronary artery atherosclerosis is evident. Mediastinum/Nodes: Widespread calcified nonenlarged mediastinal and hilar lymph nodes. Questionable thyromegaly at the thoracic inlet. Lungs/Pleura: Evidence of centrilobular emphysema. Major airways are patent. Scattered bilateral mostly subpleural pulmonary nodules, fairly numerous and some partially calcified (series 6, image 40 12 millimeter nodule). No overt calcified pleural plaques. There are linear areas of scarring in both the lingula and  the right middle lobe. No pleural effusion. No consolidation. Upper Abdomen: Negative visible noncontrast liver, spleen, pancreas and bowel in the upper abdomen. Musculoskeletal: Negative, no acute osseous abnormality identified. Review of the MIP images confirms the above findings. IMPRESSION: 1. Negative for acute pulmonary embolus. 2. Emphysema (ICD10-J43.9). 3. Calcified nonenlarged mediastinal and hilar lymph nodes with superimposed upper lobe predominant numerous bilateral mostly subpleural pulmonary nodules, some partially calcified. Top differential considerations would include treated lymphoma, silicosis, other pneumoconiosis, sequelae of prior granulomatous infection, sarcoidosis, amyloidosis. Non-contrast chest CT at 3-6 months is recommended. If the nodules are stable at time of repeat CT, then future CT at 18-24 months (from today's scan) is considered optional for low-risk patients, but is recommended for high-risk patients. This recommendation follows the consensus  statement: Guidelines for Management of Incidental Pulmonary Nodules Detected on CT Images: From the Fleischner Society 2017; Radiology 2017; 284:228-243. Electronically Signed   By: Odessa Fleming M.D.   On: 09/23/2018 15:46   Dg Chest Port 1 View  Result Date: 09/23/2018 CLINICAL DATA:  Shortness of breath. EXAM: PORTABLE CHEST 1 VIEW COMPARISON:  Radiographs of Sep 14, 2018 and November 12, 2008. FINDINGS: The heart size and mediastinal contours are within normal limits. No pneumothorax or pleural effusion is noted. Stable left midlung scarring is noted. As noted on prior exam, at least 2 new nodular densities are noted in the right upper lobe. The visualized skeletal structures are unremarkable. IMPRESSION: Several nodular densities are noted in the right upper lobe concerning for pulmonary nodules and possible neoplasm. CT scan of the chest is recommended for further evaluation. Electronically Signed   By: Lupita Raider M.D.   On: 09/23/2018 13:12    Procedures Procedures (including critical care time)  Medications Ordered in ED Medications  iohexol (OMNIPAQUE) 350 MG/ML injection 100 mL (100 mLs Intravenous Contrast Given 09/23/18 1535)     Initial Impression / Assessment and Plan / ED Course  I have reviewed the triage vital signs and the nursing notes.  Pertinent labs & imaging results that were available during my care of the patient were reviewed by me and considered in my medical decision making (see chart for details).         Patient comes in a chief complaint of exertional shortness of breath.  She has seen her PCP and her x-ray was looking unremarkable.  History is not suggestive of orthopnea, CHF-like symptoms.  She is not bleeding.  Differential diagnosis includes severe anemia versus CHF versus new onset interstitial lung disease.  X-ray did have some nonspecific nodules, therefore cancer is in the differential as well lung with possibility for PE.  Appropriate labs ordered.  Patient  will get a CT PE if her dimer is positive.  BNP is negative.  Doubt COVID-19, but we will check that as well.  Kathleen Cameron was evaluated in Emergency Department on 09/23/2018 for the symptoms described in the history of present illness. She was evaluated in the context of the global COVID-19 pandemic, which necessitated consideration that the patient might be at risk for infection with the SARS-CoV-2 virus that causes COVID-19. Institutional protocols and algorithms that pertain to the evaluation of patients at risk for COVID-19 are in a state of rapid change based on information released by regulatory bodies including the CDC and federal and state organizations. These policies and algorithms were followed during the patient's care in the ED.   Final Clinical Impressions(s) / ED Diagnoses   Final diagnoses:  Exertional dyspnea    ED Discharge Orders    None       Derwood KaplanNanavati, Brendy Ficek, MD 09/23/18 [phone]

## 2018-09-23 NOTE — ED Provider Notes (Signed)
  Physical Exam  BP (!) 174/114   Pulse 93   Temp 97.8 F (36.6 C) (Oral)   Resp (!) 22   Ht 5\' 1"  (1.549 m)   Wt 52.6 kg   SpO2 97%   BMI 21.92 kg/m   Physical Exam  ED Course/Procedures     Procedures  MDM  Care assumed at 3 pm from Dr. Rhunette Croft. Patient had abnormal chest x-ray.  Her d-dimer is elevated.  Signout pending CTA chest.  5:03 PM CTA showed no PE, has multiple calcified nodules. Differential is broad including sarcoidosis, amyloidosis, treated lymphoma (no hx of lymphoma). She is breathing comfortably. Will refer to pulmonology outpatient for further evaluation       Charlynne Pander, MD 09/23/18 1704

## 2018-09-23 NOTE — ED Triage Notes (Signed)
Pt c/o difficulty taking a deep breath & feels as though its easier to breath while she is sitting still in repose with her mouth open (per pt), she denies fever & is afebrile upon arrival to ED, denies travel &/or being in contact with anyone sick. She uses Albuterol inhaler at home & states that she may be using it too often because it losing its effectiveness.

## 2018-09-23 NOTE — Discharge Instructions (Signed)
You have multiple lymph nodes in your chest. Please follow up with a lung doctor. Stop smoking   Return to ER if you have worse shortness of breath, chest pain.

## 2018-09-23 NOTE — Telephone Encounter (Signed)
Pt called stating that she feels SOB and has a runny nose.  She states when she exerts herself her heart just races. She has had bronchitis within the last 2 months and was treated at urgent care.  She denies fever, and other symptoms.  She is unable to take her pulse for me.  She states the SOB comes and goes. She is speaking in full sentences. Per protocol pt will go to ER for evaluation of her symptoms.   Reason for Disposition . [1] MODERATE difficulty breathing (e.g., speaks in phrases, SOB even at rest, pulse 100-120) AND [2] NEW-onset or WORSE than normal  Answer Assessment - Initial Assessment Questions 1. RESPIRATORY STATUS: "Describe your breathing?" (e.g., wheezing, shortness of breath, unable to speak, severe coughing)      SOB 2. ONSET: "When did this breathing problem begin?"      This AM 3. PATTERN "Does the difficult breathing come and go, or has it been constant since it started?"      Comes and goes 4. SEVERITY: "How bad is your breathing?" (e.g., mild, moderate, severe)    - MILD: No SOB at rest, mild SOB with walking, speaks normally in sentences, can lay down, no retractions, pulse < 100.    - MODERATE: SOB at rest, SOB with minimal exertion and prefers to sit, cannot lie down flat, speaks in phrases, mild retractions, audible wheezing, pulse 100-120.    - SEVERE: Very SOB at rest, speaks in single words, struggling to breathe, sitting hunched forward, retractions, pulse > 120      When she moves heart beats fast 5. RECURRENT SYMPTOM: "Have you had difficulty breathing before?" If so, ask: "When was the last time?" and "What happened that time?"     No 6. CARDIAC HISTORY: "Do you have any history of heart disease?" (e.g., heart attack, angina, bypass surgery, angioplasty)     No 7. LUNG HISTORY: "Do you have any history of lung disease?"  (e.g., pulmonary embolus, asthma, emphysema)     None 8. CAUSE: "What do you think is causing the breathing problem?"      bronchitis 9. OTHER SYMPTOMS: "Do you have any other symptoms? (e.g., dizziness, runny nose, cough, chest pain, fever)    Runny nose, cough, 10. PREGNANCY: "Is there any chance you are pregnant?" "When was your last menstrual period?"       No 11. TRAVEL: "Have you traveled out of the country in the last month?" (e.g., travel history, exposures)      No  Protocols used: BREATHING DIFFICULTY-A-AH

## 2018-09-27 ENCOUNTER — Ambulatory Visit (INDEPENDENT_AMBULATORY_CARE_PROVIDER_SITE_OTHER): Payer: 59 | Admitting: Primary Care

## 2018-09-27 ENCOUNTER — Encounter: Payer: Self-pay | Admitting: Primary Care

## 2018-09-27 ENCOUNTER — Other Ambulatory Visit: Payer: Self-pay

## 2018-09-27 VITALS — BP 144/96 | HR 96 | Temp 97.5°F | Ht 61.0 in | Wt 105.8 lb

## 2018-09-27 DIAGNOSIS — R0609 Other forms of dyspnea: Secondary | ICD-10-CM | POA: Diagnosis not present

## 2018-09-27 DIAGNOSIS — R0600 Dyspnea, unspecified: Secondary | ICD-10-CM

## 2018-09-27 DIAGNOSIS — R918 Other nonspecific abnormal finding of lung field: Secondary | ICD-10-CM | POA: Diagnosis not present

## 2018-09-27 DIAGNOSIS — R0602 Shortness of breath: Secondary | ICD-10-CM | POA: Insufficient documentation

## 2018-09-27 LAB — CBC WITH DIFFERENTIAL/PLATELET
Basophils Absolute: 0 10*3/uL (ref 0.0–0.1)
Basophils Relative: 0.7 % (ref 0.0–3.0)
Eosinophils Absolute: 0.3 10*3/uL (ref 0.0–0.7)
Eosinophils Relative: 5.7 % — ABNORMAL HIGH (ref 0.0–5.0)
HCT: 41.6 % (ref 36.0–46.0)
Hemoglobin: 13.8 g/dL (ref 12.0–15.0)
Lymphocytes Relative: 26.9 % (ref 12.0–46.0)
Lymphs Abs: 1.2 10*3/uL (ref 0.7–4.0)
MCHC: 33.2 g/dL (ref 30.0–36.0)
MCV: 87.9 fl (ref 78.0–100.0)
Monocytes Absolute: 0.3 10*3/uL (ref 0.1–1.0)
Monocytes Relative: 6.3 % (ref 3.0–12.0)
Neutro Abs: 2.7 10*3/uL (ref 1.4–7.7)
Neutrophils Relative %: 60.4 % (ref 43.0–77.0)
Platelets: 254 10*3/uL (ref 150.0–400.0)
RBC: 4.73 Mil/uL (ref 3.87–5.11)
RDW: 13.3 % (ref 11.5–15.5)
WBC: 4.4 10*3/uL (ref 4.0–10.5)

## 2018-09-27 LAB — HEPATIC FUNCTION PANEL
ALT: 8 U/L (ref 0–35)
AST: 14 U/L (ref 0–37)
Albumin: 4.7 g/dL (ref 3.5–5.2)
Alkaline Phosphatase: 56 U/L (ref 39–117)
Bilirubin, Direct: 0.1 mg/dL (ref 0.0–0.3)
Total Bilirubin: 0.5 mg/dL (ref 0.2–1.2)
Total Protein: 8.3 g/dL (ref 6.0–8.3)

## 2018-09-27 LAB — SEDIMENTATION RATE: Sed Rate: 29 mm/hr (ref 0–30)

## 2018-09-27 NOTE — Assessment & Plan Note (Signed)
-   New exertional dyspnea x 2 months with weight loss - Abnormal CHEST CT - Needs PFTs

## 2018-09-27 NOTE — Patient Instructions (Addendum)
Use albuterol rescue inhaler 2 puffs every 4-6 hours for break through shortness of breath/wheezing  Orders: Labs today  PFTs- dyspnea ( new consult) CT chest w/o contrast in 3 months   Follow-up:  Fu in 2-4 weeks with Dr. Waymon Amato

## 2018-09-27 NOTE — Progress Notes (Signed)
 @Patient  ID: Kathleen SchlatterJacqueline B Cajuste, female    DOB: 01/07/1966, 53 y.o.   MRN: [id number]  Chief Complaint  Patient presents with   Follow-up    SOB with exertion, cough with clear mucus,     Referring provider: Georgina QuintSagardia, Miguel Jose, *  HPI: 53 year old female, never smoked (passive exposure). PMH significant for hypertension, dyslipidemia. New to [facility], has not seen pulmonary provider before. Patient seen in ED on 6/4 with shortness of breath. Covid negative. D-dimer elevated. CTA negative for PE, however, did show emphysema and calcified non-enlarged mediastinal and hilar lymph nodules with superimposed upper lobe predominant numerous bilateral subpleural pulmonary nodules.   09/27/2018 Patient presents today for ED follow-up shortness of breath. New pulmonary consult. Exertional shortness of breath started 2 months ago. Occasional but rare cough which is worse at night. Get white mucus up. Wheezing with cough. Her weight is up and down.  Appetite is ok.  She has lost 20 lbs without trying. She had been walking a lot prior to dyspnea symptoms starting. She works at [employer], she has been there for over 20 years. 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 Exposed to dusk and chemicals. Her husband smoked. Denies fever, chest pain, hemoptysis, joint pain, snoring, vision or skin changes.   Exposure history: Tobacco- passive exposure, husband smoked  Work- [employer] for 20 years  Pets- none Mold- none    No Known Allergies  There is no immunization history for the selected administration types on file for this patient.  Past Medical History:  Diagnosis Date   Hypertension     Tobacco History: Social History   Tobacco Use  Smoking Status Never Smoker  Smokeless Tobacco Never Used   Counseling given: Not Answered   Outpatient Medications Prior to Visit  Medication Sig Dispense Refill   albuterol (VENTOLIN HFA) 108 (90 Base) MCG/ACT inhaler INHALE 2 PUFFS INTO THE LUNGS  EVERY 6 HOURS AS NEEDED FOR WHEEZING OR SHORTNESS OF BREATH (Patient taking differently: Inhale 2 puffs into the lungs every 4 (four) hours as needed for wheezing or shortness of breath. ) 6.7 g 0   amLODipine (NORVASC) 10 MG tablet Take 1 tablet (10 mg total) by mouth daily. 90 tablet 3   benzonatate (TESSALON) 200 MG capsule Take 1 capsule (200 mg total) by mouth 2 (two) times daily as needed for cough. 20 capsule 0   furosemide (LASIX) 40 MG tablet Take 1 tablet (40 mg total) by mouth daily. 30 tablet 3   hydrochlorothiazide (HYDRODIURIL) 25 MG tablet Take 1 tablet (25 mg total) by mouth daily. 90 tablet 3   lisinopril (PRINIVIL,ZESTRIL) 40 MG tablet Take 1 tablet (40 mg total) by mouth daily. 90 tablet 3   rosuvastatin (CRESTOR) 10 MG tablet Take 1 tablet (10 mg total) by mouth daily. 90 tablet 3   azithromycin (ZITHROMAX) 250 MG tablet Sig as indicated 6 tablet 0   HYDROcodone-homatropine (HYCODAN) 5-1.5 MG/5ML syrup Take 5 mLs by mouth at bedtime as needed for cough. 120 mL 0   No facility-administered medications prior to visit.    Review of Systems  Review of Systems  Constitutional: Positive for unexpected weight change.  HENT: Negative.   Eyes: Negative.   Respiratory: Positive for cough, shortness of breath and wheezing.   Cardiovascular: Negative.   Musculoskeletal: Negative.   Skin: Negative.    Physical Exam  BP (!) 144/96 (BP Location: Left Arm, Cuff Size: Normal)    Pulse 96    Temp [name](!)  97.5 F (36.4 C)    Ht 5\' 1"  (1.549 m)    Wt 105 lb 12.8 oz (48 kg)    SpO2 95%    BMI 19.99 kg/m  Physical Exam Constitutional:      General: She is not in acute distress.    Appearance: Normal appearance. She is not ill-appearing.  HENT:     Head: Normocephalic and atraumatic.     Right Ear: Tympanic membrane normal.     Left Ear: Tympanic membrane normal.     Mouth/Throat:     Mouth: Mucous membranes are moist.     Pharynx: Oropharynx is clear.  Cardiovascular:      Rate and Rhythm: Normal rate and regular rhythm.  Pulmonary:     Effort: Pulmonary effort is normal.     Breath sounds: Normal breath sounds. No wheezing or rhonchi.  Skin:    General: Skin is warm and dry.  Neurological:     Mental Status: She is alert.  Psychiatric:        Mood and Affect: Mood normal.        Behavior: Behavior normal.        Thought Content: Thought content normal.        Judgment: Judgment normal.      Lab Results:  CBC    Component Value Date/Time   WBC 4.4 09/23/2018 1156   RBC 4.33 09/23/2018 1156   HGB 12.5 09/23/2018 1156   HGB 13.4 09/14/2018 1220   HCT 38.0 09/23/2018 1156   HCT 39.3 09/14/2018 1220   PLT 223 09/23/2018 1156   PLT 303 09/14/2018 1220   MCV 87.8 09/23/2018 1156   MCV 84 09/14/2018 1220   MCH 28.9 09/23/2018 1156   MCHC 32.9 09/23/2018 1156   RDW 12.7 09/23/2018 1156   RDW 12.9 09/14/2018 1220   LYMPHSABS 0.9 09/23/2018 1156   LYMPHSABS 1.7 09/14/2018 1220   MONOABS 0.3 09/23/2018 1156   EOSABS 0.2 09/23/2018 1156   EOSABS 0.3 09/14/2018 1220   BASOSABS 0.0 09/23/2018 1156   BASOSABS 0.1 09/14/2018 1220    BMET    Component Value Date/Time   NA 140 09/23/2018 1156   NA 135 09/14/2018 1220   K 3.8 09/23/2018 1156   CL 102 09/23/2018 1156   CO2 27 09/23/2018 1156   GLUCOSE 95 09/23/2018 1156   BUN 11 09/23/2018 1156   BUN 21 09/14/2018 1220   CREATININE 1.21 (H) 09/23/2018 1156   CALCIUM 9.6 09/23/2018 1156   GFRNONAA 50 (L) 09/23/2018 1156   GFRAA 58 (L) 09/23/2018 1156    BNP    Component Value Date/Time   BNP 22.5 09/23/2018 1156    ProBNP    Component Value Date/Time   PROBNP 379.9 (H) 09/18/2013 0400    Imaging: Dg Chest 2 View  Result Date: 09/14/2018 CLINICAL DATA:  Dyspnea on exertion. EXAM: CHEST - 2 VIEW COMPARISON:  11/12/2008. FINDINGS: Mediastinum and hilar structures normal. Heart size normal. Three pulmonary nodules are noted projected over the right upper lung. These appear to be new.  Nonenhanced chest CT suggested to further evaluate. No pleural effusion or pneumothorax. No acute bony abnormality. IMPRESSION: Three pulmonary nodules are noted projected over the right upper lung. These appear to be new. Nonenhanced chest CT is suggested to further evaluate. Electronically Signed   By: Maisie Fushomas  Register   On: 09/14/2018 13:22   Ct Angio Chest Pe W/cm &/or Wo Cm  Result Date: 09/23/2018 CLINICAL DATA:  53 year old female  with shortness of breath, abnormal D-dimer. EXAM: CT ANGIOGRAPHY CHEST WITH CONTRAST TECHNIQUE: Multidetector CT imaging of the chest was performed using the standard protocol during bolus administration of intravenous contrast. Multiplanar CT image reconstructions and MIPs were obtained to evaluate the vascular anatomy. CONTRAST:  100mL OMNIPAQUE IOHEXOL 350 MG/ML SOLN COMPARISON:  Portable chest earlier today. FINDINGS: Cardiovascular: Excellent contrast bolus timing in the pulmonary arterial tree. No focal filling defect identified in the pulmonary arteries to suggest acute pulmonary embolism. No cardiomegaly. No pericardial effusion. Tortuous thoracic aorta. No calcified coronary artery atherosclerosis is evident. Mediastinum/Nodes: Widespread calcified nonenlarged mediastinal and hilar lymph nodes. Questionable thyromegaly at the thoracic inlet. Lungs/Pleura: Evidence of centrilobular emphysema. Major airways are patent. Scattered bilateral mostly subpleural pulmonary nodules, fairly numerous and some partially calcified (series 6, image 40 12 millimeter nodule). No overt calcified pleural plaques. There are linear areas of scarring in both the lingula and the right middle lobe. No pleural effusion. No consolidation. Upper Abdomen: Negative visible noncontrast liver, spleen, pancreas and bowel in the upper abdomen. Musculoskeletal: Negative, no acute osseous abnormality identified. Review of the MIP images confirms the above findings. IMPRESSION: 1. Negative for acute  pulmonary embolus. 2. Emphysema (ICD10-J43.9). 3. Calcified nonenlarged mediastinal and hilar lymph nodes with superimposed upper lobe predominant numerous bilateral mostly subpleural pulmonary nodules, some partially calcified. Top differential considerations would include treated lymphoma, silicosis, other pneumoconiosis, sequelae of prior granulomatous infection, sarcoidosis, amyloidosis. Non-contrast chest CT at 3-6 months is recommended. If the nodules are stable at time of repeat CT, then future CT at 18-24 months (from today's scan) is considered optional for low-risk patients, but is recommended for high-risk patients. This recommendation follows the consensus statement: Guidelines for Management of Incidental Pulmonary Nodules Detected on CT Images: From the Fleischner Society 2017; Radiology 2017; 284:228-243. Electronically Signed   By: Odessa FlemingH  Hall M.D.   On: 09/23/2018 15:46   Dg Chest Port 1 View  Result Date: 09/23/2018 CLINICAL DATA:  Shortness of breath. EXAM: PORTABLE CHEST 1 VIEW COMPARISON:  Radiographs of Sep 14, 2018 and November 12, 2008. FINDINGS: The heart size and mediastinal contours are within normal limits. No pneumothorax or pleural effusion is noted. Stable left midlung scarring is noted. As noted on prior exam, at least 2 new nodular densities are noted in the right upper lobe. The visualized skeletal structures are unremarkable. IMPRESSION: Several nodular densities are noted in the right upper lobe concerning for pulmonary nodules and possible neoplasm. CT scan of the chest is recommended for further evaluation. Electronically Signed   By: Lupita RaiderJames  Green Jr M.D.   On: 09/23/2018 13:12     Assessment & Plan:  53 yr old female, never smoked (passive exposure). Chief compliant dyspnea x 2 months. Associated rare cough and unintentional weight loss. CTA negative for PE but showed new mediastinal and hilar lymph nodules with numerous bilateral subpleural pulmonary nodules. Differential  includes lymphoma, silicosis, pneumonconiosis, sequelae prior granulomatous infection, sarcoidosis, amyloidosis. Needs labs and PFTs. Repeat CT chest w/o contrast in 3 months. May need bronchoscopy for biopsy. Refer and follow-up with Dr. Delton CoombesByrum in 2-4 weeks.   Multiple pulmonary nodules - Never smoked, passive exposure - Positive dyspnea and weight loss  - CTA 09/23/18 showed calcified non-enlarged mediastinal and hilar lymph nodules with superimposed upper lobe predominant numerous bilateral subpleural pulmonary nodules - Needs lab work including CBC with diff, sed rate, ACE level, RAST panel and hypersensitivity panel  - Follow up in 2-4 weeks with Dr. Johnathan HausenByrum/ Icard/[facility], may  need bronchoscopy for bx   Dyspnea - New exertional dyspnea x 2 months with weight loss - Abnormal CHEST CT - Needs PFTs   Martyn Ehrich, NP 09/27/2018

## 2018-09-27 NOTE — Assessment & Plan Note (Signed)
-   Never smoked, passive exposure - Positive dyspnea and weight loss  - CTA 09/23/18 showed calcified non-enlarged mediastinal and hilar lymph nodules with superimposed upper lobe predominant numerous bilateral subpleural pulmonary nodules - Needs lab work including CBC with diff, sed rate, ACE level, RAST panel and hypersensitivity panel  - Follow up in 2-4 weeks with Dr. Waymon Amato, may need bronchoscopy for bx

## 2018-09-29 ENCOUNTER — Telehealth (INDEPENDENT_AMBULATORY_CARE_PROVIDER_SITE_OTHER): Payer: 59 | Admitting: Emergency Medicine

## 2018-09-29 ENCOUNTER — Other Ambulatory Visit: Payer: Self-pay

## 2018-09-29 ENCOUNTER — Encounter: Payer: Self-pay | Admitting: Emergency Medicine

## 2018-09-29 VITALS — BP 144/96 | Ht 61.0 in | Wt 105.0 lb

## 2018-09-29 DIAGNOSIS — R0609 Other forms of dyspnea: Secondary | ICD-10-CM

## 2018-09-29 DIAGNOSIS — R0600 Dyspnea, unspecified: Secondary | ICD-10-CM

## 2018-09-29 DIAGNOSIS — I10 Essential (primary) hypertension: Secondary | ICD-10-CM

## 2018-09-29 DIAGNOSIS — J449 Chronic obstructive pulmonary disease, unspecified: Secondary | ICD-10-CM | POA: Insufficient documentation

## 2018-09-29 DIAGNOSIS — J439 Emphysema, unspecified: Secondary | ICD-10-CM

## 2018-09-29 DIAGNOSIS — E785 Hyperlipidemia, unspecified: Secondary | ICD-10-CM

## 2018-09-29 DIAGNOSIS — R059 Cough, unspecified: Secondary | ICD-10-CM

## 2018-09-29 DIAGNOSIS — R05 Cough: Secondary | ICD-10-CM

## 2018-09-29 DIAGNOSIS — R918 Other nonspecific abnormal finding of lung field: Secondary | ICD-10-CM

## 2018-09-29 LAB — RESPIRATORY ALLERGY PROFILE REGION II ~~LOC~~
Allergen, A. alternata, m6: <0.1 kU/L
Allergen, Cedar tree, t12: <0.1 kU/L
Allergen, Comm Silver Birch, t9: <0.1 kU/L
Allergen, Cottonwood, t14: <0.1 kU/L
Allergen, D pternoyssinus,d7: <0.1 kU/L
Allergen, Mouse Urine Protein, e78: <0.1 kU/L
Allergen, Mulberry, t76: <0.1 kU/L
Allergen, Oak,t7: <0.1 kU/L
Allergen, P. notatum, m1: <0.1 kU/L
Aspergillus fumigatus, m3: <0.1 kU/L
Bermuda Grass: <0.1 kU/L
Box Elder IgE: <0.1 kU/L
CLADOSPORIUM HERBARUM (M2) IGE: <0.1 kU/L
COMMON RAGWEED (SHORT) (W1) IGE: <0.1 kU/L
Cat Dander: <0.1 kU/L
Class: 0
Class: 0
Class: 0
Class: 0
Class: 0
Class: 0
Class: 0
Class: 0
Class: 0
Class: 0
Class: 0
Class: 0
Class: 0
Class: 0
Class: 0
Class: 0
Class: 0
Class: 0
Class: 0
Class: 0
Class: 0
Class: 0
Class: 0
Class: 0
Cockroach: 0.14 kU/L — ABNORMAL HIGH
D. farinae: <0.1 kU/L
Dog Dander: <0.1 kU/L
Elm IgE: <0.1 kU/L
IgE (Immunoglobulin E), Serum: 45 kU/L (ref <=114)
Johnson Grass: <0.1 kU/L
Pecan/Hickory Tree IgE: <0.1 kU/L
Rough Pigweed  IgE: <0.1 kU/L
Sheep Sorrel IgE: <0.1 kU/L
Timothy Grass: <0.1 kU/L

## 2018-09-29 LAB — INTERPRETATION:

## 2018-09-29 LAB — ANGIOTENSIN CONVERTING ENZYME: Angiotensin-Converting Enzyme: 1 U/L — ABNORMAL LOW (ref 9–67)

## 2018-09-29 MED ORDER — AMLODIPINE BESYLATE 10 MG PO TABS: 10.0000 mg | ORAL_TABLET | Freq: Every day | ORAL | 3 refills | Status: DC

## 2018-09-29 MED ORDER — DM-GUAIFENESIN ER 30-600 MG PO TB12: 1.0000 | ORAL_TABLET | Freq: Two times a day (BID) | ORAL | 2 refills | Status: DC | PRN

## 2018-09-29 MED ORDER — LISINOPRIL 40 MG PO TABS: 40.0000 mg | ORAL_TABLET | Freq: Every day | ORAL | 3 refills | Status: DC

## 2018-09-29 MED ORDER — ROSUVASTATIN CALCIUM 10 MG PO TABS: 10.0000 mg | ORAL_TABLET | Freq: Every day | ORAL | 3 refills | Status: DC

## 2018-09-29 NOTE — Progress Notes (Signed)
Telemedicine Encounter- SOAP NOTE Established Patient  This telephone encounter was conducted with the patient's (or proxy's) verbal consent via audio telecommunications: yes/no: Yes Patient was instructed to have this encounter in a suitably private space; and to only have persons present to whom they give permission to participate. In addition, patient identity was confirmed by use of name plus two identifiers (DOB and address).  I discussed the limitations, risks, security and privacy concerns of performing an evaluation and management service by telephone and the availability of in person appointments. I also discussed with the patient that there may be a patient responsible charge related to this service. The patient expressed understanding and agreed to proceed.  I spent a total of TIME; 0 MIN TO 60 MIN: 10 minutes talking with the patient or their proxy.  No chief complaint on file. Follow-up on dyspnea on exertion  Subjective   Kathleen Cameron is a 53 y.o. female established patient. Telephone visit today for follow-up of dyspnea on exertion.  History of hypertension.  Recently in the emergency room.  PE was ruled out.  COVID negative.  CT of the chest showed multiple pulmonary nodules as well as emphysema.  Non-smoker.  Was seen by pulmonary doctor 2 days ago and started on albuterol MDI as needed.  Follow-up appointment on 10/11/2018.  She may need a bronchoscopy.  No new symptoms.  Complaining of increase phlegm with occasional cough the past several weeks.  No other significant symptoms.  HPI   Patient Active Problem List   Diagnosis Date Noted  . Multiple pulmonary nodules 09/27/2018  . Dyspnea 09/27/2018  . Dyslipidemia 07/07/2018  . Uncontrolled hypertension 03/10/2018    Past Medical History:  Diagnosis Date  . Hypertension     Current Outpatient Medications  Medication Sig Dispense Refill  . albuterol (VENTOLIN HFA) 108 (90 Base) MCG/ACT inhaler INHALE 2  PUFFS INTO THE LUNGS EVERY 6 HOURS AS NEEDED FOR WHEEZING OR SHORTNESS OF BREATH (Patient taking differently: Inhale 2 puffs into the lungs every 4 (four) hours as needed for wheezing or shortness of breath. ) 6.7 g 0  . amLODipine (NORVASC) 10 MG tablet Take 1 tablet (10 mg total) by mouth daily. 90 tablet 3  . lisinopril (ZESTRIL) 40 MG tablet Take 1 tablet (40 mg total) by mouth daily. 90 tablet 3  . rosuvastatin (CRESTOR) 10 MG tablet Take 1 tablet (10 mg total) by mouth daily. 90 tablet 3  . benzonatate (TESSALON) 200 MG capsule Take 1 capsule (200 mg total) by mouth 2 (two) times daily as needed for cough. (Patient not taking: Reported on 09/29/2018) 20 capsule 0  . dextromethorphan-guaiFENesin (MUCINEX DM) 30-600 MG 12hr tablet Take 1 tablet by mouth 2 (two) times daily as needed for cough. 30 tablet 2  . furosemide (LASIX) 40 MG tablet Take 1 tablet (40 mg total) by mouth daily. (Patient not taking: Reported on 09/29/2018) 30 tablet 3  . hydrochlorothiazide (HYDRODIURIL) 25 MG tablet Take 1 tablet (25 mg total) by mouth daily. (Patient not taking: Reported on 09/29/2018) 90 tablet 3   No current facility-administered medications for this visit.     No Known Allergies  Social History   Socioeconomic History  . Marital status: Single    Spouse name: Not on file  . Number of children: Not on file  . Years of education: Not on file  . Highest education level: Not on file  Occupational History  . Not on file  Social Needs  .  Financial resource strain: Not on file  . Food insecurity:    Worry: Not on file    Inability: Not on file  . Transportation needs:    Medical: Not on file    Non-medical: Not on file  Tobacco Use  . Smoking status: Never Smoker  . Smokeless tobacco: Never Used  Substance and Sexual Activity  . Alcohol use: Yes    Alcohol/week: 2.0 standard drinks    Types: 2 Cans of beer per week  . Drug use: Never  . Sexual activity: Not on file  Lifestyle  . Physical  activity:    Days per week: Not on file    Minutes per session: Not on file  . Stress: Not on file  Relationships  . Social connections:    Talks on phone: Not on file    Gets together: Not on file    Attends religious service: Not on file    Active member of club or organization: Not on file    Attends meetings of clubs or organizations: Not on file    Relationship status: Not on file  . Intimate partner violence:    Fear of current or ex partner: Not on file    Emotionally abused: Not on file    Physically abused: Not on file    Forced sexual activity: Not on file  Other Topics Concern  . Not on file  Social History Narrative  . Not on file    Review of Systems  Constitutional: Negative.  Negative for chills and fever.  HENT: Negative.  Negative for congestion and nosebleeds.   Eyes: Negative.   Respiratory: Negative for cough and shortness of breath.   Cardiovascular: Negative.  Negative for chest pain and palpitations.  Gastrointestinal: Negative for abdominal pain, diarrhea and vomiting.  Genitourinary: Negative for dysuria.  Musculoskeletal: Negative.  Negative for myalgias and neck pain.  Skin: Negative.  Negative for rash.  Neurological: Negative for dizziness and headaches.  All other systems reviewed and are negative.   Objective   Vitals as reported by the patient: Today's Vitals   09/29/18 1207  BP: (!) 144/96  Weight: 105 lb (47.6 kg)  Height: 5\' 1"  (1.549 m)  Alert and oriented x3 in no apparent respiratory distress.  Diagnoses and all orders for this visit:  Dyspnea on exertion  Uncontrolled hypertension Comments: Improved Orders: -     amLODipine (NORVASC) 10 MG tablet; Take 1 tablet (10 mg total) by mouth daily. -     lisinopril (ZESTRIL) 40 MG tablet; Take 1 tablet (40 mg total) by mouth daily.  Dyslipidemia -     rosuvastatin (CRESTOR) 10 MG tablet; Take 1 tablet (10 mg total) by mouth daily.  Cough -     dextromethorphan-guaiFENesin  (MUCINEX DM) 30-600 MG 12hr tablet; Take 1 tablet by mouth 2 (two) times daily as needed for cough.  Pulmonary nodules  Pulmonary emphysema, unspecified emphysema type (HCC)   Clinically stable.  No medical concerns identified during this visit.  Continue present medications. Office visit in 2 to 3 months.   I discussed the assessment and treatment plan with the patient. The patient was provided an opportunity to ask questions and all were answered. The patient agreed with the plan and demonstrated an understanding of the instructions.   The patient was advised to call back or seek an in-person evaluation if the symptoms worsen or if the condition fails to improve as anticipated.  I provided 10 minutes of non-face-to-face time  during this encounter.  Horald Pollen, MD  Primary Care at [facility]

## 2018-09-29 NOTE — Progress Notes (Signed)
Called patient to triage for appointment. Patient is following up from ED visit on 09/23/2018 for shortness of breath. Patient states she is concerned because she still has shortness of breath. Patient states she feels like it is mucus in her throat at night. Patient had an appointment on 09/27/2018 with Dr Melvyn Novas for shortness of breath.

## 2018-10-01 LAB — HYPERSENSITIVITY PNEUMONITIS
A. Pullulans Abs: NEGATIVE
A.Fumigatus #1 Abs: NEGATIVE
Micropolyspora faeni, IgG: NEGATIVE
Pigeon Serum Abs: NEGATIVE
Thermoact. Saccharii: NEGATIVE
Thermoactinomyces vulgaris, IgG: NEGATIVE

## 2018-10-08 ENCOUNTER — Telehealth: Payer: Self-pay | Admitting: Emergency Medicine

## 2018-10-08 NOTE — Telephone Encounter (Signed)
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

## 2018-10-08 NOTE — Telephone Encounter (Signed)
fyi

## 2018-10-11 ENCOUNTER — Ambulatory Visit: Payer: 59 | Admitting: Emergency Medicine

## 2018-10-11 ENCOUNTER — Other Ambulatory Visit: Payer: Self-pay | Admitting: Emergency Medicine

## 2018-10-11 ENCOUNTER — Other Ambulatory Visit: Payer: Self-pay | Admitting: Primary Care

## 2018-10-11 ENCOUNTER — Other Ambulatory Visit (HOSPITAL_COMMUNITY)
Admission: RE | Admit: 2018-10-11 | Discharge: 2018-10-11 | Disposition: A | Discharge status: Home / Self Care | Payer: 59 | Source: Ambulatory Visit | Attending: Primary Care | Admitting: Primary Care

## 2018-10-11 DIAGNOSIS — Z1159 Encounter for screening for other viral diseases: Secondary | ICD-10-CM | POA: Diagnosis not present

## 2018-10-11 LAB — SARS CORONAVIRUS 2 (TAT 6-24 HRS): SARS Coronavirus 2: NEGATIVE

## 2018-10-12 NOTE — Progress Notes (Signed)
Please let patient know COVID was negative. PFTs scheduled for thursday

## 2018-10-14 ENCOUNTER — Ambulatory Visit (INDEPENDENT_AMBULATORY_CARE_PROVIDER_SITE_OTHER): Payer: 59 | Admitting: Emergency Medicine

## 2018-10-14 ENCOUNTER — Other Ambulatory Visit: Payer: Self-pay

## 2018-10-14 ENCOUNTER — Telehealth: Payer: Self-pay | Admitting: Emergency Medicine

## 2018-10-14 ENCOUNTER — Encounter: Payer: Self-pay | Admitting: Emergency Medicine

## 2018-10-14 VITALS — BP 132/94 | HR 89 | Ht 60.25 in | Wt 106.0 lb

## 2018-10-14 DIAGNOSIS — R0609 Other forms of dyspnea: Secondary | ICD-10-CM | POA: Diagnosis not present

## 2018-10-14 DIAGNOSIS — R9389 Abnormal findings on diagnostic imaging of other specified body structures: Secondary | ICD-10-CM

## 2018-10-14 DIAGNOSIS — R0600 Dyspnea, unspecified: Secondary | ICD-10-CM

## 2018-10-14 DIAGNOSIS — J438 Other emphysema: Secondary | ICD-10-CM | POA: Diagnosis not present

## 2018-10-14 LAB — PULMONARY FUNCTION TEST
FEF 25-75 Post: 0.19 L/sec
FEF 25-75 Pre: 0.2 L/sec
FEF2575-%Change-Post: -9 %
FEF2575-%Pred-Post: 9 %
FEF2575-%Pred-Pre: 10 %
FEV1-%Change-Post: 0 %
FEV1-%Pred-Post: 29 %
FEV1-%Pred-Pre: 29 %
FEV1-Post: 0.56 L
FEV1-Pre: 0.56 L
FEV1FVC-%Change-Post: 10 %
FEV1FVC-%Pred-Pre: 64 %
FEV6-%Change-Post: -7 %
FEV6-%Pred-Post: 42 %
FEV6-%Pred-Pre: 46 %
FEV6-Post: 0.98 L
FEV6-Pre: 1.06 L
FEV6FVC-%Change-Post: 1 %
FEV6FVC-%Pred-Post: 103 %
FEV6FVC-%Pred-Pre: 101 %
FVC-%Change-Post: -8 %
FVC-%Pred-Post: 41 %
FVC-%Pred-Pre: 45 %
FVC-Post: 0.98 L
FVC-Pre: 1.08 L
Post FEV1/FVC ratio: 57 %
Post FEV6/FVC ratio: 99 %
Pre FEV1/FVC ratio: 52 %
Pre FEV6/FVC Ratio: 98 %

## 2018-10-14 MED ORDER — BUDESONIDE-FORMOTEROL FUMARATE 160-4.5 MCG/ACT IN AERO: 2.0000 | INHALATION_SPRAY | Freq: Two times a day (BID) | RESPIRATORY_TRACT | 0 refills | Status: DC

## 2018-10-14 NOTE — Assessment & Plan Note (Signed)
With emphysematous change, calcified mediastinal and hilar lymphadenopathy, calcified nodular disease. 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  The differential is broad but she does not have any history of common exposures that would cause this clinical picture.  She never traveled or lived in areas with endemic fungal disease which would explain fibrosing mediastinitis, etc. We will need to consider possible nodal and/or transbronchial biopsy.  Her ACE level was low, unclear that she has active disease right now which would decrease the sensitivity of any biopsy.

## 2018-10-14 NOTE — Progress Notes (Signed)
Patient seen in the office today and instructed on use of Symbicort 160.  Patient expressed understanding and demonstrated technique. 

## 2018-10-14 NOTE — Telephone Encounter (Signed)
Call returned to patient, she states she was told she needs to be out of work for a month and wanted a Quarry manager. No mention of this on AVS. Company contact info for fax number: [employer] [phone].   RB please advise of letter. Thanks.

## 2018-10-14 NOTE — Assessment & Plan Note (Addendum)
Very severe obstruction noted on her pulmonary function testing from today.  This is coupled with emphysematous change that was noted on her CT scan of the chest.  Unclear cause especially in absence of a tobacco history.  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  The sarcoid diagnosis has not yet been confirmed. I believe we should do a trial of bronchodilator therapy to see if she benefits.  We will try Symbicort to see if she gets benefit, continue albuterol as needed.  In absence of a tobacco history check alpha-1 antitrypsin, HIV.

## 2018-10-14 NOTE — Patient Instructions (Signed)
Please try starting Symbicort 2 puffs twice a day.  Remember to rinse and gargle after using.  We will check at your next visit to see if you are benefiting from this medicine. Keep your albuterol (Proventil) available use 2 puffs if needed for shortness of breath, chest tightness, wheezing. We will check lab work today We will perform a walking oximetry today Depending on your evaluation going forward we will consider whether there is any benefit to performing biopsies of lymph node or lung tissue. Follow with Dr Lamonte Sakai in 1 month

## 2018-10-14 NOTE — Progress Notes (Signed)
Pre and post spirometry done today.

## 2018-10-14 NOTE — Progress Notes (Signed)
Subjective:    Patient ID: Kathleen Cameron, female    DOB: [birth year], 53 y.o.   MRN: [id number]  HPI 53 year old woman, never smoker with a history of hypertension, hyperlipidemia.  She was seen in our office 09/27/2018 after an ER visit for dyspnea.  This prompted a CT scan of her chest on 6/4 that I reviewed, shows no PE, emphysematous change and calcified mediastinal and hilar lymphadenopathy, some subpleural pulmonary nodules some partially calcified.  She states that she had a significant dust exposure, chemical exposure and secondhand smoke exposure with her husband.  No known mold exposure. 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 She has had wheeze before. The SOB dates back for less than a year.   6/8 labs: ACE 1, ESR 29, respiratory allergy panel mildly positive for cockroach, IgE 45, eosinophils 5.7%  Pulmonary function testing from today shows very severe obstruction with an FEV1 of 0.56 L (29% predicted), no bronchodilator response.  She was unable to do lung volumes or DLCO testing.  Her flow volume loop is consistent with severe obstruction.   Review of Systems  Past Medical History:  Diagnosis Date  . Hypertension      No family history on file.   Social History   Socioeconomic History  . Marital status: Single    Spouse name: Not on file  . Number of children: Not on file  . Years of education: Not on file  . Highest education level: Not on file  Occupational History  . Not on file  Social Needs  . Financial resource strain: Not on file  . Food insecurity    Worry: Not on file    Inability: Not on file  . Transportation needs    Medical: Not on file    Non-medical: Not on file  Tobacco Use  . Smoking status: Never Smoker  . Smokeless tobacco: Never Used  Substance and Sexual Activity  . Alcohol use: Yes    Alcohol/week: 2.0 standard drinks    Types: 2 Cans of beer per week  . Drug use: Never  . Sexual activity: Not on file  Lifestyle  .  Physical activity    Days per week: Not on file    Minutes per session: Not on file  . Stress: Not on file  Relationships  . Social Herbalist on phone: Not on file    Gets together: Not on file    Attends religious service: Not on file    Active member of club or organization: Not on file    Attends meetings of clubs or organizations: Not on file    Relationship status: Not on file  . Intimate partner violence    Fear of current or ex partner: Not on file    Emotionally abused: Not on file    Physically abused: Not on file    Forced sexual activity: Not on file  Other Topics Concern  . Not on file  Social History Narrative  . Not on file     No Known Allergies   Outpatient Medications Prior to Visit  Medication Sig Dispense Refill  . albuterol (VENTOLIN HFA) 108 (90 Base) MCG/ACT inhaler INHALE 2 PUFFS INTO THE LUNGS EVERY 6 HOURS AS NEEDED FOR WHEEZING OR SHORTNESS OF BREATH (Patient taking differently: Inhale 2 puffs into the lungs every 4 (four) hours as needed for wheezing or shortness of breath. ) 6.7 g 0  . amLODipine (NORVASC) 10 MG tablet Take 1  tablet (10 mg total) by mouth daily. 90 tablet 3  . dextromethorphan-guaiFENesin (MUCINEX DM) 30-600 MG 12hr tablet Take 1 tablet by mouth 2 (two) times daily as needed for cough. 30 tablet 2  . lisinopril (ZESTRIL) 40 MG tablet Take 1 tablet (40 mg total) by mouth daily. 90 tablet 3  . rosuvastatin (CRESTOR) 10 MG tablet Take 1 tablet (10 mg total) by mouth daily. 90 tablet 3  . benzonatate (TESSALON) 200 MG capsule Take 1 capsule (200 mg total) by mouth 2 (two) times daily as needed for cough. (Patient not taking: Reported on 09/29/2018) 20 capsule 0  . furosemide (LASIX) 40 MG tablet Take 1 tablet (40 mg total) by mouth daily. (Patient not taking: Reported on 09/29/2018) 30 tablet 3  . hydrochlorothiazide (HYDRODIURIL) 25 MG tablet Take 1 tablet (25 mg total) by mouth daily. (Patient not taking: Reported on 09/29/2018) 90  tablet 3   No facility-administered medications prior to visit.         Objective:   Physical Exam Vitals:   10/14/18 1335  BP: (!) 132/94  Pulse: 89  SpO2: 94%  Weight: 106 lb (48.1 kg)  Height: 5' 0.25" (1.53 m)   Gen: Pleasant, thin woman, in no distress,  normal affect  ENT: No lesions,  mouth clear,  oropharynx clear, no postnasal drip  Neck: No JVD, no stridor  Lungs: No use of accessory muscles, very distant, no crackles or wheezing on normal respiration, no wheeze on forced expiration  Cardiovascular: RRR, heart sounds normal, no murmur or gallops, no peripheral edema  Musculoskeletal: No deformities, no cyanosis or clubbing  Neuro: alert, awake, non focal  Skin: Warm, no lesions or rash      Assessment & Plan:  Dyspnea Very severe obstruction noted on her pulmonary function testing from today.  This is coupled with emphysematous change that was noted on her CT scan of the chest.  Unclear cause especially in absence of a tobacco history.  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  The sarcoid diagnosis has not yet been confirmed. I believe we should do a trial of bronchodilator therapy to see if she benefits.  We will try Symbicort to see if she gets benefit, continue albuterol as needed.  In absence of a tobacco history check alpha-1 antitrypsin, HIV.  Abnormal CT of the chest With emphysematous change, calcified mediastinal and hilar lymphadenopathy, calcified nodular disease. 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  The differential is broad but she does not have any history of common exposures that would cause this clinical picture.  She never traveled or lived in areas with endemic fungal disease which would explain fibrosing mediastinitis, etc. We will need to consider possible nodal and/or transbronchial biopsy.  Her ACE level was low, unclear that she has active disease right now  which would decrease the sensitivity of any biopsy.   Baltazar Apo, MD, PhD 10/14/2018, 3:39 PM [facility] [phone] or if no answer [phone]

## 2018-10-15 ENCOUNTER — Telehealth: Payer: Self-pay | Admitting: Emergency Medicine

## 2018-10-15 NOTE — Telephone Encounter (Signed)
Called and spoke with pt. Pt said a form was given to either RB or Ria Comment after the visit yesterday 6/25 and she wanted to know if the form had been filled out and returned back to her employer.  Ria Comment or Dr. Lamonte Sakai, please advise on this for pt. Thank you!

## 2018-10-15 NOTE — Telephone Encounter (Signed)
I was not given a form for this patient after her visit. There was not a form in RB's inbox up front this morning when I checked it.

## 2018-10-15 NOTE — Telephone Encounter (Signed)
Instructions Please try starting Symbicort 2 puffs twice a day.  Remember to rinse and gargle after using.  We will check at your next visit to see if you are benefiting from this medicine. Keep your albuterol (Proventil) available use 2 puffs if needed for shortness of breath, chest tightness, wheezing. We will check lab work today We will perform a walking oximetry today Depending on your evaluation going forward we will consider whether there is any benefit to performing biopsies of lymph node or lung tissue. Follow with Dr Lamonte Sakai in 1 month     Called and spoke with pt letting her know that the only meds that were given were the samples that they gave at [place]. Told her that RB would reassess at next OV to see if she was benefiting from the Symbicort prior to sending Rx to pharmacy. Pt verbalized understanding. Nothing further needed.

## 2018-10-18 NOTE — Telephone Encounter (Signed)
Correct phone number for pt is [phone].  Called and spoke with pt letting her know that no form was received by RB that needed to be filled out and returned back to her employer.  Pt further clarified that she was needing anything stating that she is to be out of work for a month and said that her employer said a letter would be okay stating this.  Dr. Lamonte Sakai, please advise if you are okay with a letter being written stating that pt needs to be out of work for a month. Thank you!

## 2018-10-18 NOTE — Telephone Encounter (Signed)
Pt returningcall and can be reached @ [phone].Kathleen Cameron

## 2018-10-18 NOTE — Telephone Encounter (Signed)
Attempted to call pt but unable to reach. Left message for pt to return call. 

## 2018-10-19 NOTE — Telephone Encounter (Signed)
Pending Dr. Agustina Caroli reply

## 2018-10-20 LAB — ALPHA-1 ANTITRYPSIN PHENOTYPE: A-1 Antitrypsin, Ser: 147 mg/dL (ref 83–199)

## 2018-10-20 LAB — HIV ANTIBODY (ROUTINE TESTING W REFLEX): HIV 1&2 Ab, 4th Generation: NONREACTIVE

## 2018-10-20 NOTE — Telephone Encounter (Signed)
Called and spoke with pt letting her know that we were still waiting on a response from [place] in regards to the letter and pt verbalized understanding.  Dr. Lamonte Sakai, please advise if you are okay with [place] writing a letter stating that pt needs to be out of work for a month. Thank you!

## 2018-10-20 NOTE — Telephone Encounter (Signed)
Pt is calling back for an update. [phone].

## 2018-10-21 NOTE — Telephone Encounter (Signed)
OK to write and send a letter that she should not go to work until after she sees me on 11/15/2018 and is cleared to return. Reason is for obstructive lung disease. Thanks.

## 2018-10-21 NOTE — Telephone Encounter (Signed)
Spoke with patient. She is ok with this letter. She had requested this letter to be emailed to [employer]. I explained to her that we could not email documents but I could mail the letter to her. Will place a copy of the letter in the mail to her and to her manager today.   Nothing further needed at time of call.

## 2018-10-27 NOTE — Telephone Encounter (Signed)
Pt calling back to check status. Please advise   [phone]

## 2018-11-01 ENCOUNTER — Telehealth: Payer: Self-pay | Admitting: *Deleted

## 2018-11-01 ENCOUNTER — Telehealth: Payer: Self-pay | Admitting: Emergency Medicine

## 2018-11-01 MED ORDER — BUDESONIDE-FORMOTEROL FUMARATE 160-4.5 MCG/ACT IN AERO: 2.0000 | INHALATION_SPRAY | Freq: Two times a day (BID) | RESPIRATORY_TRACT | 2 refills | Status: DC

## 2018-11-01 NOTE — Telephone Encounter (Signed)
Call returned to patient, medication and pharmacy confirmed. Refill sent in. Voiced understanding. Nothing further is needed at this time.

## 2018-11-01 NOTE — Telephone Encounter (Signed)
Called patient to pick up forms Disability or FMLA, because Dr Lamonte Sakai at [facility] should be the provider to complete them.

## 2018-11-15 ENCOUNTER — Telehealth: Payer: Self-pay | Admitting: *Deleted

## 2018-11-15 ENCOUNTER — Encounter: Payer: Self-pay | Admitting: Emergency Medicine

## 2018-11-15 ENCOUNTER — Ambulatory Visit (INDEPENDENT_AMBULATORY_CARE_PROVIDER_SITE_OTHER): Payer: Commercial Managed Care - PPO | Admitting: Emergency Medicine

## 2018-11-15 ENCOUNTER — Other Ambulatory Visit: Payer: Self-pay

## 2018-11-15 VITALS — BP 164/100 | HR 78 | Ht 60.0 in | Wt 110.0 lb

## 2018-11-15 DIAGNOSIS — R590 Localized enlarged lymph nodes: Secondary | ICD-10-CM

## 2018-11-15 MED ORDER — STIOLTO RESPIMAT 2.5-2.5 MCG/ACT IN AERS: 2.0000 | INHALATION_SPRAY | Freq: Every day | RESPIRATORY_TRACT | 2 refills | Status: DC

## 2018-11-15 NOTE — Assessment & Plan Note (Signed)
Question fixed asthma, question related to her mediastinal adenopathy.  She did not get much benefit from Symbicort and we will try change to Stiolto to see if she improves more.  If so continue.  Please stop Symbicort at this time We will do a trial of Stiolto 2 puffs once daily to see if you get more benefit.  If so please call our office and we will send a prescription for this to your pharmacy. Keep albuterol available to use 2 puffs if needed for shortness of breath, chest tightness, wheezing. Flu shot in the fall Follow with Dr. Lamonte Sakai in December to review your CT scan or sooner if you have any problems.

## 2018-11-15 NOTE — Telephone Encounter (Signed)
Spoke to patient about the Disability/FMLA forms she left here at the office. I called patient on 11/01/2018 to let know to pick up forms to take to Dr Brock Ra. I spoke to patient this morning and she states the forms were picked up already and the doctor has them. Patient has an appointment today at 11:00 am.

## 2018-11-15 NOTE — Patient Instructions (Signed)
Please stop Symbicort at this time We will do a trial of Stiolto 2 puffs once daily to see if you get more benefit.  If so please call our office and we will send a prescription for this to your pharmacy. Keep albuterol available to use 2 puffs if needed for shortness of breath, chest tightness, wheezing. Flu shot in the fall We will plan to repeat your CT scan of the chest in December 2022 compared with your prior. Follow with Dr. Lamonte Sakai in December to review your CT scan or sooner if you have any problems.

## 2018-11-15 NOTE — Assessment & Plan Note (Signed)
Calcified mediastinal and hilar adenopathy with scattered partially calcified nodule disease.  Unclear etiology but suspect chronic.  She needs a repeat CT to assess for interval change in December.  Depending on clinical status, any radiographical changes we will determine whether she needs endobronchial ultrasound and biopsies.

## 2018-11-15 NOTE — Progress Notes (Signed)
   Subjective:    Patient ID: Kathleen Cameron, female    DOB: 1965/06/26, 53 y.o.   MRN: [id number]  HPI 53 year old woman, never smoker with a history of hypertension, hyperlipidemia.  She was seen in our office 09/27/2018 after an ER visit for dyspnea.  This prompted a CT scan of her chest on 6/4 that I reviewed, shows no PE, emphysematous change and calcified mediastinal and hilar lymphadenopathy, some subpleural pulmonary nodules some partially calcified.  She states that she had a significant dust exposure, chemical exposure and secondhand smoke exposure with her husband.  No known mold exposure. 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 She has had wheeze before. The SOB dates back for less than a year.   6/8 labs: ACE 1, ESR 29, respiratory allergy panel mildly positive for cockroach, IgE 45, eosinophils 5.7%  Pulmonary function testing from today shows very severe obstruction with an FEV1 of 0.56 L (29% predicted), no bronchodilator response.  She was unable to do lung volumes or DLCO testing.  Her flow volume loop is consistent with severe obstruction.  ROV 11/15/2018 --follow-up visit today for obstructive lung disease, emphysematous change on CT, calcified mediastinal and hilar lymphadenopathy with some subpleural nodularity (partially calcified).  Normal ACE level as above.  At her last visit we tried initiating Symbicort to see if she would get benefit.  Labs to evaluate for emphysema, HIV negative, alpha-1 antitrypsin MM, normal level.  Plan repeat CT December  Review of Systems      Objective:   Physical Exam Vitals:   11/15/18 1112  BP: (!) 164/100  Pulse: 78  SpO2: 97%  Weight: 110 lb (49.9 kg)  Height: 5' (1.524 m)   Gen: Pleasant, thin woman, in no distress,  normal affect  ENT: No lesions,  mouth clear,  oropharynx clear, no postnasal drip  Neck: No JVD, no stridor  Lungs: No use of accessory muscles, very distant, no crackles or wheezing on normal  respiration, no wheeze on forced expiration  Cardiovascular: RRR, heart sounds normal, no murmur or gallops, no peripheral edema  Musculoskeletal: No deformities, no cyanosis or clubbing  Neuro: alert, awake, non focal  Skin: Warm, no lesions or rash      Assessment & Plan:  Abnormal CT of the chest Calcified mediastinal and hilar adenopathy with scattered partially calcified nodule disease.  Unclear etiology but suspect chronic.  She needs a repeat CT to assess for interval change in December.  Depending on clinical status, any radiographical changes we will determine whether she needs endobronchial ultrasound and biopsies.  Pulmonary emphysema (HCC) Question fixed asthma, question related to her mediastinal adenopathy.  She did not get much benefit from Symbicort and we will try change to Stiolto to see if she improves more.  If so continue.  Please stop Symbicort at this time We will do a trial of Stiolto 2 puffs once daily to see if you get more benefit.  If so please call our office and we will send a prescription for this to your pharmacy. Keep albuterol available to use 2 puffs if needed for shortness of breath, chest tightness, wheezing. Flu shot in the fall Follow with Dr. Lamonte Sakai in December to review your CT scan or sooner if you have any problems.  Baltazar Apo, MD, PhD 11/15/2018, 11:24 AM [facility] [phone] or if no answer [phone]

## 2018-11-18 ENCOUNTER — Telehealth: Payer: Self-pay | Admitting: Emergency Medicine

## 2018-11-18 NOTE — Telephone Encounter (Signed)
Left message for patient to call back  

## 2018-11-18 NOTE — Telephone Encounter (Signed)
At pt's OV with RB 7/27, the symbicort inhaler was stopped and pt was placed on a trial of Stiolto.  Attempted to call pt but unable to reach. Left message for pt to return call.

## 2018-11-18 NOTE — Telephone Encounter (Signed)
Pt is calling back [phone]

## 2018-11-18 NOTE — Telephone Encounter (Signed)
It appears per Dr. Agustina Caroli last note the patient was not having clinical improvement with Symbicort 160.Did the patient feel that her breathing was better managed on Symbicort?Wyn Quaker, FNP

## 2018-11-18 NOTE — Telephone Encounter (Signed)
Called and spoke with pt who stated she picked up the [facility] 7/29 and when she used the inhaler, it gave her a headache and also stated that her heart began racing after using.  Pt has not used the [employer] today and states that she feels better as she no longer has a headache and also states that her heart is no longer racing.   Asked pt how she felt while taking the symbicort prior to starting the [facility] and she said she did not have any of the side effects that she had with the Stiolto.   Pt is wanting to know recommendations if she should fully stop using the Stiolto, if she should go back to taking the Symbicort, or if there is another inhaler that could be an option for her to also give it a try. Aaron Edelman, please advise on this for pt. Thanks!

## 2018-11-19 MED ORDER — BUDESONIDE-FORMOTEROL FUMARATE 160-4.5 MCG/ACT IN AERO: 2.0000 | INHALATION_SPRAY | Freq: Two times a day (BID) | RESPIRATORY_TRACT | 2 refills | Status: DC

## 2018-11-19 NOTE — Telephone Encounter (Signed)
Called and spoke with patient. She states the symbicort was helping her better with her breathing than the stiolto. I asked again if the symbicort was helping her breathing and she said yes symbicort helps her breathing best.

## 2018-11-19 NOTE — Telephone Encounter (Signed)
Called and spoke with pt letting her know that Aaron Edelman said it was fine for her to go back to taking the Symbicort and to stop using the [place]. Pt verbalized understanding. Asked pt if she needed an Rx of the Symbicort to be sent to pharmacy and pt said that she did. Verified pt's preferred pharmacy and sent Rx for Symbicort to pharmacy for pt. Also removed Stiolto off of pt's med list and also added Stiolto to pt's allergy list. Nothing further needed.

## 2018-11-19 NOTE — Telephone Encounter (Signed)
Kathleen Cameron to transition back to Symbicort.  Stop Stiolto.  Add Stiolto reaction of headaches and patient's other symptoms to chart.  Please route to Dr. Lamonte Sakai as Juluis Rainier.  Patient needs to keep follow-up visit with our office.  Wyn Quaker, FNP

## 2018-11-22 ENCOUNTER — Telehealth: Payer: Self-pay | Admitting: Emergency Medicine

## 2018-11-22 NOTE — Telephone Encounter (Signed)
Instructions from OV with RB 7/27  Please stop Symbicort at this time We will do a trial of Stiolto 2 puffs once daily to see if you get more benefit.  If so please call our office and we will send a prescription for this to your pharmacy. Keep albuterol available to use 2 puffs if needed for shortness of breath, chest tightness, wheezing. Flu shot in the fall We will plan to repeat your CT scan of the chest in December 2022 compared with your prior. Follow with Dr. Lamonte Sakai in December to review your CT scan or sooner if you have any problems.     Called and spoke with pt letting her know that Rx for Symbicort was sent to pharmacy for her 7/31. Pt said when she went to pick this up, they had Rx for pills for her but stated that they did not have Rx for Symbicort. Stated to pt that I would call pharmacy to check on this and then would call her back and pt verbalized understanding.  Called pt's pharmacy and spoke [address] who stated that they do have the Symbicort Rx on file but it is currently in hold status due to it being too soon for insurance to be able to cover it. Pt should be able to pick Rx up tomorrow 8/4 but pt is to call pharmacy prior to going to make sure they have been able to get the Rx ready for her.  Called and spoke with pt letting her know the info I found out from pharmacy and pt verbalized understanding. Nothing further needed.

## 2018-11-24 ENCOUNTER — Telehealth: Payer: Self-pay | Admitting: Emergency Medicine

## 2018-11-24 NOTE — Telephone Encounter (Signed)
ATC pt, line rang several times then went to a fast busy signal. Will try back. 

## 2018-11-25 MED ORDER — BUDESONIDE-FORMOTEROL FUMARATE 160-4.5 MCG/ACT IN AERO: 2.0000 | INHALATION_SPRAY | Freq: Two times a day (BID) | RESPIRATORY_TRACT | 0 refills | Status: DC

## 2018-11-25 NOTE — Telephone Encounter (Signed)
Spoke with patient. She stated that she was requesting a sample of Symbicort 160 until she will be able to get her medication from her pharmacy. Advised her that I would a sample up front for her. She verbalized understanding.   Nothing further needed at time of call.

## 2018-11-25 NOTE — Telephone Encounter (Signed)
Attempted to call pt but unable to reach. Left message for pt to return call. 

## 2018-11-25 NOTE — Telephone Encounter (Signed)
Patient is returning phone call.  Patient phone number is [phone].

## 2018-12-03 ENCOUNTER — Telehealth: Payer: Self-pay | Admitting: Emergency Medicine

## 2018-12-06 NOTE — Telephone Encounter (Signed)
If she is having increase dyspnea and back pain in absence of any other sx then she either needs to be seen here as an acute OV, or alternatively go to the ED. Would need possible r/o PE depending on the clinical eval.

## 2018-12-06 NOTE — Telephone Encounter (Signed)
Called and spoke with pt letting her know the info stated by RB that we either need to get her in for an appt to be seen or she needs to go to the ER. Pt said she would come in to office for appt but due to her husband not getting off work until late, she could not come today.  I have scheduled pt an OV with Beth tomorrow, 8/18 at 9am. Nothing further needed.

## 2018-12-06 NOTE — Telephone Encounter (Signed)
Called and spoke with pt. Pt has had more trouble with her breathing that has now been going on x3 days. Pt said that she has had to use her rescue inhaler 4 times daily to see if it would help with her breathing and she said that she has gotten little relief from that. Pt has also had upper back pain which she has taken advil to see if that would help and pt said that she did get some relief from the advil for the back pain.  Pt denies any complaints of cough, no fever, no chest tightness, no wheezing, no muscle or abdominal pain, no increased fatigue or weakness.  Pt wants to know what we recommend to help with her breathing and also if we have any other suggestions for her back pain.  Dr. Lamonte Sakai, please advise on this for pt. Thanks!

## 2018-12-06 NOTE — Progress Notes (Signed)
@Patient  ID: Kathleen Cameron, female    DOB: 1965/05/30, 53 y.o.   MRN: [id number]  Chief Complaint  Patient presents with  . Shortness of Breath    Started 12/03/18. Patient said she is out of breath after only walking 10 - 15 feet. It is worse if she tries to sleep on left side.    Referring provider: Horald Pollen, *  HPI: 53 year old female, never smoked (passive exposure). PMH significant for hypertension, dyslipidemia. New to [facility] pulmonary, has not seen pulmonary provider before. Patient seen in ED on 6/4 with shortness of breath. Covid negative. D-dimer elevated. CTA negative for PE, however, did show emphysema and calcified non-enlarged mediastinal and hilar lymph nodules with superimposed upper lobe predominant numerous bilateral subpleural pulmonary nodules.   Previous [facility] pulmonary encounter: 6/8/2020Volanda Napoleon, NP Patient presents today for ED follow-up shortness of breath. New pulmonary consult. Exertional shortness of breath started 2 months ago. Occasional but rare cough which is worse at night. Get white mucus up. Wheezing with cough. Her weight is up and down.  Appetite is ok.  She has lost 20 lbs without trying. She had been walking a lot prior to dyspnea symptoms starting. She works at [employer], she has been there for over 20 years. 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 Exposed to dusk and chemicals. Her husband smoked. Denies fever, chest pain, hemoptysis, joint pain, snoring, vision or skin changes.   7/27/20Lamonte Sakai, MD Question fixed asthma. FEV1 0.56 (29%), RATIO 57. Not much benefit from Symbicort, changed to stiolto. Abnormal CT chest showed calcified mediastinal hilar adenopathy with scattered partially calcified nodule disease. Unclear etiology but suspected to be chronic. Needs repeat in December. May consider endobronchial ultrasound/biopsy at that time. SARS and HIV negative. Alpha 1 normal.   12/07/2018 Patient presents today for increased dyspnea and  upper back pain. Complains of dyspnea x 4 days. She gets winded after walking 10 feet. O2 sat 97% RA. She can not lay on left side. No cough. She is unable to use stiolto, switched back to symbicort. Notices an improvement with inhaler use. She is out of her rescue inhaler. Not working currently. Less active. Heat makes her symptoms worse. Her weight is stable, she is up 5 lbs. BP at last visit 164/100. Took all her medication this morning.  Denies wheezing or chest pain.  Provider BP check: Left- 140/90 Right- 150/100  Exposure history: Tobacco- passive exposure, husband smoked  Work- Dance movement psychotherapist for 20 years  Pets- none Mold- none   Allergies  Allergen Reactions  . Stiolto Respimat [Tiotropium Bromide-Olodaterol] Other (See Comments)    Severe headaches and rapid heartrate    There is no immunization history for the selected administration types on file for this patient.  Past Medical History:  Diagnosis Date  . Hypertension     Tobacco History: Social History   Tobacco Use  Smoking Status Never Smoker  Smokeless Tobacco Never Used   Counseling given: Not Answered   Outpatient Medications Prior to Visit  Medication Sig Dispense Refill  . amLODipine (NORVASC) 10 MG tablet Take 1 tablet (10 mg total) by mouth daily. 90 tablet 3  . dextromethorphan-guaiFENesin (MUCINEX DM) 30-600 MG 12hr tablet Take 1 tablet by mouth 2 (two) times daily as needed for cough. 30 tablet 2  . lisinopril (ZESTRIL) 40 MG tablet Take 1 tablet (40 mg total) by mouth daily. 90 tablet 3  . rosuvastatin (CRESTOR) 10 MG tablet Take 1 tablet (10 mg total) by  mouth daily. 90 tablet 3  . budesonide-formoterol (SYMBICORT) 160-4.5 MCG/ACT inhaler Inhale 2 puffs into the lungs 2 (two) times daily. 3 Inhaler 2  . budesonide-formoterol (SYMBICORT) 160-4.5 MCG/ACT inhaler Inhale 2 puffs into the lungs 2 (two) times daily. 1 Inhaler 0  . furosemide (LASIX) 40 MG tablet Take 1 tablet by mouth daily.    [name]. albuterol  (VENTOLIN HFA) 108 (90 Base) MCG/ACT inhaler INHALE 2 PUFFS INTO THE LUNGS EVERY 6 HOURS AS NEEDED FOR WHEEZING OR SHORTNESS OF BREATH (Patient not taking: No sig reported) 6.7 g 0   No facility-administered medications prior to visit.    Review of Systems  Review of Systems  Constitutional: Negative.   HENT: Negative.   Respiratory: Positive for shortness of breath. Negative for cough and wheezing.   Musculoskeletal: Positive for back pain.   Physical Exam  BP (!) 142/96 (BP Location: Left Arm, Patient Position: Sitting, Cuff Size: Normal)   Pulse 79   Temp 97.8 F (36.6 C)   Ht 5' (1.524 m)   Wt 110 lb (49.9 kg)   SpO2 97%   BMI 21.48 kg/m  Physical Exam Constitutional:      General: She is not in acute distress.    Appearance: She is well-developed and normal weight. She is not ill-appearing.  HENT:     Head: Normocephalic and atraumatic.  Cardiovascular:     Rate and Rhythm: Normal rate and regular rhythm.  Pulmonary:     Effort: Pulmonary effort is normal. No tachypnea, accessory muscle usage or respiratory distress.     Breath sounds: Decreased breath sounds present. No wheezing or rhonchi.  Chest:     Chest wall: Tenderness present.  Musculoskeletal:     Comments: Upper back tender to palpation below scapular on both sides   Neurological:     General: No focal deficit present.     Mental Status: She is alert and oriented to person, place, and time.  Psychiatric:        Mood and Affect: Mood normal.        Behavior: Behavior normal.      Lab Results:  CBC    Component Value Date/Time   WBC 4.4 09/27/2018 0945   RBC 4.73 09/27/2018 0945   HGB 13.8 09/27/2018 0945   HGB 13.4 09/14/2018 1220   HCT 41.6 09/27/2018 0945   HCT 39.3 09/14/2018 1220   PLT 254.0 09/27/2018 0945   PLT 303 09/14/2018 1220   MCV 87.9 09/27/2018 0945   MCV 84 09/14/2018 1220   MCH 28.9 09/23/2018 1156   MCHC 33.2 09/27/2018 0945   RDW 13.3 09/27/2018 0945   RDW 12.9  09/14/2018 1220   LYMPHSABS 1.2 09/27/2018 0945   LYMPHSABS 1.7 09/14/2018 1220   MONOABS 0.3 09/27/2018 0945   EOSABS 0.3 09/27/2018 0945   EOSABS 0.3 09/14/2018 1220   BASOSABS 0.0 09/27/2018 0945   BASOSABS 0.1 09/14/2018 1220    BMET    Component Value Date/Time   NA 140 09/23/2018 1156   NA 135 09/14/2018 1220   K 3.8 09/23/2018 1156   CL 102 09/23/2018 1156   CO2 27 09/23/2018 1156   GLUCOSE 95 09/23/2018 1156   BUN 11 09/23/2018 1156   BUN 21 09/14/2018 1220   CREATININE 1.21 (H) 09/23/2018 1156   CALCIUM 9.6 09/23/2018 1156   GFRNONAA 50 (L) 09/23/2018 1156   GFRAA 58 (L) 09/23/2018 1156    BNP    Component Value Date/Time   BNP 22.5  09/23/2018 1156    ProBNP    Component Value Date/Time   PROBNP 379.9 (H) 09/18/2013 0400    Imaging: Dg Chest 2 View  Result Date: 12/07/2018 CLINICAL DATA:  Shortness of breath, left flank pain EXAM: CHEST - 2 VIEW COMPARISON:  09/23/2018 FINDINGS: Multiple rounded nodules are again noted in the upper lobes bilaterally, stable since prior study and prior CT. No confluent airspace opacities. Heart is normal size. No effusions. No acute bony abnormality. IMPRESSION: No active cardiopulmonary disease. Electronically Signed   By: Charlett NoseKevin  Dover M.D.   On: 12/07/2018 09:37     Assessment & Plan:   Pulmonary emphysema (HCC) - Increased dyspnea on exertion - Question fixed asthma. FEV1 0.56 (29%), RATIO 57. - Continue Symbicort 160 two puffs twice daily - Add Spiriva respimat 2.35mcg  Abnormal CT of the chest - CXR today showed no acute cardiopulmonary disease; multiple rounded nodules are again noted in the upper lobes bilaterally, stable since prior study and prior CT - Plan repeat CT chest in December and may consider endobronchial ultrasound/biopsy at that time   Uncontrolled hypertension - BP 140/90; 150/100 - Reports compliance with medications - Continues Norvasc 10mg , Lisinopril 40mg  and lasix 40mg  - Follow-up with  PCP recommended   Upper back pain - Consistent with muscular skeletal pain, reproducible/tender to palpation - Recommend stretching and massage - If pain persists follow-up with PCP, consider physical therapy - ED if worsens   Glenford BayleyElizabeth W Charitie Hinote, NP 12/07/2018

## 2018-12-07 ENCOUNTER — Ambulatory Visit (INDEPENDENT_AMBULATORY_CARE_PROVIDER_SITE_OTHER): Payer: Commercial Managed Care - PPO | Admitting: Primary Care

## 2018-12-07 ENCOUNTER — Ambulatory Visit (INDEPENDENT_AMBULATORY_CARE_PROVIDER_SITE_OTHER): Payer: Commercial Managed Care - PPO

## 2018-12-07 ENCOUNTER — Encounter: Payer: Self-pay | Admitting: General Surgery

## 2018-12-07 ENCOUNTER — Encounter: Payer: Self-pay | Admitting: Primary Care

## 2018-12-07 ENCOUNTER — Other Ambulatory Visit: Payer: Self-pay

## 2018-12-07 VITALS — BP 142/96 | HR 79 | Temp 97.8°F | Ht 60.0 in | Wt 110.0 lb

## 2018-12-07 DIAGNOSIS — R9389 Abnormal findings on diagnostic imaging of other specified body structures: Secondary | ICD-10-CM | POA: Diagnosis not present

## 2018-12-07 DIAGNOSIS — I10 Essential (primary) hypertension: Secondary | ICD-10-CM | POA: Diagnosis not present

## 2018-12-07 DIAGNOSIS — M549 Dorsalgia, unspecified: Secondary | ICD-10-CM

## 2018-12-07 DIAGNOSIS — R0602 Shortness of breath: Secondary | ICD-10-CM

## 2018-12-07 DIAGNOSIS — J438 Other emphysema: Secondary | ICD-10-CM | POA: Diagnosis not present

## 2018-12-07 MED ORDER — SPIRIVA RESPIMAT 2.5 MCG/ACT IN AERS: 1.0000 | INHALATION_SPRAY | Freq: Every day | RESPIRATORY_TRACT | 0 refills | Status: DC

## 2018-12-07 MED ORDER — BUDESONIDE-FORMOTEROL FUMARATE 160-4.5 MCG/ACT IN AERO: 2.0000 | INHALATION_SPRAY | Freq: Two times a day (BID) | RESPIRATORY_TRACT | 6 refills | Status: DC

## 2018-12-07 MED ORDER — ALBUTEROL SULFATE HFA 108 (90 BASE) MCG/ACT IN AERS: INHALATION_SPRAY | RESPIRATORY_TRACT | 0 refills | Status: DC

## 2018-12-07 MED ORDER — BUDESONIDE-FORMOTEROL FUMARATE 160-4.5 MCG/ACT IN AERO: 2.0000 | INHALATION_SPRAY | Freq: Two times a day (BID) | RESPIRATORY_TRACT | 0 refills | Status: DC

## 2018-12-07 NOTE — Assessment & Plan Note (Addendum)
-   CXR today showed no acute cardiopulmonary disease; multiple rounded nodules are again noted in the upper lobes bilaterally, stable since prior study and prior CT - Plan repeat CT chest in December and may consider endobronchial ultrasound/biopsy at that time

## 2018-12-07 NOTE — Assessment & Plan Note (Addendum)
-   Consistent with muscular skeletal pain, reproducible/tender to palpation - Recommend stretching and massage - If pain persists follow-up with PCP, consider physical therapy - ED if worsens

## 2018-12-07 NOTE — Patient Instructions (Addendum)
Back pain consistent with muscular skeletal, try tennis ball exercises/stretches on wall   Orders: CXR today looked ok; No acute disease. Chronic findings.   Recommendations: Continue Symbicort - two puffs twice daily Add spiriva respimat - two puffs once daily (stop if you develops side effects) Continue albuterol 2 puffs every 4-6 hours as needed for shortness of breath/wheezing  Follow-up: PCP regarding elevated BP  ED if symptoms worsen in any way

## 2018-12-07 NOTE — Assessment & Plan Note (Addendum)
-   Increased dyspnea on exertion - Question fixed asthma. FEV1 0.56 (29%), RATIO 57. - Continue Symbicort 160 two puffs twice daily - Add Spiriva respimat 2.35mcg

## 2018-12-07 NOTE — Assessment & Plan Note (Signed)
-   BP 140/90; 150/100 - Reports compliance with medications - Continues Norvasc 10mg , Lisinopril 40mg  and lasix 40mg  - Follow-up with PCP recommended

## 2018-12-08 NOTE — Progress Notes (Signed)
Reviewed and agree with assessment/plan.   Butler Vegh, MD [facility] 04/16/2016, 12:24 PM Pager:  [phone]  

## 2018-12-20 ENCOUNTER — Other Ambulatory Visit: Payer: Self-pay

## 2018-12-20 ENCOUNTER — Inpatient Hospital Stay (HOSPITAL_COMMUNITY)
Admission: EM | Admit: 2018-12-20 | Discharge: 2018-12-22 | DRG: 190 | Disposition: A | Discharge status: Home / Self Care | Payer: Commercial Managed Care - PPO | Attending: Internal Medicine | Admitting: Internal Medicine

## 2018-12-20 ENCOUNTER — Emergency Department (HOSPITAL_COMMUNITY): Payer: Commercial Managed Care - PPO

## 2018-12-20 ENCOUNTER — Observation Stay (HOSPITAL_BASED_OUTPATIENT_CLINIC_OR_DEPARTMENT_OTHER): Payer: Commercial Managed Care - PPO

## 2018-12-20 ENCOUNTER — Encounter (HOSPITAL_COMMUNITY): Payer: Self-pay | Admitting: Emergency Medicine

## 2018-12-20 DIAGNOSIS — I10 Essential (primary) hypertension: Secondary | ICD-10-CM

## 2018-12-20 DIAGNOSIS — Z7951 Long term (current) use of inhaled steroids: Secondary | ICD-10-CM

## 2018-12-20 DIAGNOSIS — R0902 Hypoxemia: Secondary | ICD-10-CM

## 2018-12-20 DIAGNOSIS — J441 Chronic obstructive pulmonary disease with (acute) exacerbation: Secondary | ICD-10-CM | POA: Diagnosis not present

## 2018-12-20 DIAGNOSIS — J9621 Acute and chronic respiratory failure with hypoxia: Secondary | ICD-10-CM | POA: Diagnosis present

## 2018-12-20 DIAGNOSIS — I129 Hypertensive chronic kidney disease with stage 1 through stage 4 chronic kidney disease, or unspecified chronic kidney disease: Secondary | ICD-10-CM | POA: Diagnosis present

## 2018-12-20 DIAGNOSIS — N183 Chronic kidney disease, stage 3 (moderate): Secondary | ICD-10-CM | POA: Diagnosis present

## 2018-12-20 DIAGNOSIS — J449 Chronic obstructive pulmonary disease, unspecified: Secondary | ICD-10-CM | POA: Diagnosis present

## 2018-12-20 DIAGNOSIS — J9601 Acute respiratory failure with hypoxia: Secondary | ICD-10-CM | POA: Diagnosis present

## 2018-12-20 DIAGNOSIS — R0602 Shortness of breath: Secondary | ICD-10-CM

## 2018-12-20 DIAGNOSIS — R918 Other nonspecific abnormal finding of lung field: Secondary | ICD-10-CM | POA: Diagnosis present

## 2018-12-20 DIAGNOSIS — Z888 Allergy status to other drugs, medicaments and biological substances status: Secondary | ICD-10-CM

## 2018-12-20 DIAGNOSIS — Z79899 Other long term (current) drug therapy: Secondary | ICD-10-CM

## 2018-12-20 DIAGNOSIS — E785 Hyperlipidemia, unspecified: Secondary | ICD-10-CM | POA: Diagnosis present

## 2018-12-20 DIAGNOSIS — J439 Emphysema, unspecified: Secondary | ICD-10-CM | POA: Diagnosis present

## 2018-12-20 DIAGNOSIS — Z20828 Contact with and (suspected) exposure to other viral communicable diseases: Secondary | ICD-10-CM | POA: Diagnosis present

## 2018-12-20 LAB — CBC WITH DIFFERENTIAL/PLATELET
Abs Immature Granulocytes: 0.05 10*3/uL (ref 0.00–0.07)
Abs Immature Granulocytes: 0.03 10*3/uL (ref 0.00–0.07)
Basophils Absolute: 0.1 10*3/uL (ref 0.0–0.1)
Basophils Absolute: 0 10*3/uL (ref 0.0–0.1)
Basophils Relative: 1 %
Basophils Relative: 0 %
Eosinophils Absolute: 0.4 10*3/uL (ref 0.0–0.5)
Eosinophils Absolute: 0 10*3/uL (ref 0.0–0.5)
Eosinophils Relative: 3 %
Eosinophils Relative: 0 %
HCT: 40.4 % (ref 36.0–46.0)
HCT: 38.6 % (ref 36.0–46.0)
Hemoglobin: 13.3 g/dL (ref 12.0–15.0)
Hemoglobin: 12.7 g/dL (ref 12.0–15.0)
Immature Granulocytes: 0 %
Immature Granulocytes: 0 %
Lymphocytes Relative: 33 %
Lymphocytes Relative: 3 %
Lymphs Abs: 4.6 10*3/uL — ABNORMAL HIGH (ref 0.7–4.0)
Lymphs Abs: 0.4 10*3/uL — ABNORMAL LOW (ref 0.7–4.0)
MCH: 28.6 pg (ref 26.0–34.0)
MCH: 28.9 pg (ref 26.0–34.0)
MCHC: 32.9 g/dL (ref 30.0–36.0)
MCHC: 32.9 g/dL (ref 30.0–36.0)
MCV: 86.9 fL (ref 80.0–100.0)
MCV: 87.7 fL (ref 80.0–100.0)
Monocytes Absolute: 0.6 10*3/uL (ref 0.1–1.0)
Monocytes Absolute: 0.1 10*3/uL (ref 0.1–1.0)
Monocytes Relative: 5 %
Monocytes Relative: 1 %
Neutro Abs: 8.2 10*3/uL — ABNORMAL HIGH (ref 1.7–7.7)
Neutro Abs: 10.8 10*3/uL — ABNORMAL HIGH (ref 1.7–7.7)
Neutrophils Relative %: 58 %
Neutrophils Relative %: 96 %
Platelets: 298 10*3/uL (ref 150–400)
Platelets: 264 10*3/uL (ref 150–400)
RBC: 4.65 MIL/uL (ref 3.87–5.11)
RBC: 4.4 MIL/uL (ref 3.87–5.11)
RDW: 11.9 % (ref 11.5–15.5)
RDW: 11.9 % (ref 11.5–15.5)
WBC: 13.9 10*3/uL — ABNORMAL HIGH (ref 4.0–10.5)
WBC: 11.3 10*3/uL — ABNORMAL HIGH (ref 4.0–10.5)
nRBC: 0 % (ref 0.0–0.2)
nRBC: 0 % (ref 0.0–0.2)

## 2018-12-20 LAB — COMPREHENSIVE METABOLIC PANEL
ALT: 13 U/L (ref 0–44)
AST: 20 U/L (ref 15–41)
Albumin: 4.3 g/dL (ref 3.5–5.0)
Alkaline Phosphatase: 65 U/L (ref 38–126)
Anion gap: 13 (ref 5–15)
BUN: 20 mg/dL (ref 6–20)
CO2: 29 mmol/L (ref 22–32)
Calcium: 9.6 mg/dL (ref 8.9–10.3)
Chloride: 96 mmol/L — ABNORMAL LOW (ref 98–111)
Creatinine, Ser: 1.32 mg/dL — ABNORMAL HIGH (ref 0.44–1.00)
GFR calc Af Amer: 53 mL/min — ABNORMAL LOW (ref >60)
GFR calc non Af Amer: 45 mL/min — ABNORMAL LOW (ref >60)
Glucose, Bld: 136 mg/dL — ABNORMAL HIGH (ref 70–99)
Potassium: 3.5 mmol/L (ref 3.5–5.1)
Sodium: 138 mmol/L (ref 135–145)
Total Bilirubin: 0.5 mg/dL (ref 0.3–1.2)
Total Protein: 8.6 g/dL — ABNORMAL HIGH (ref 6.5–8.1)

## 2018-12-20 LAB — ECHOCARDIOGRAM COMPLETE
Height: 60 in
Weight: 1780.8 oz

## 2018-12-20 LAB — BASIC METABOLIC PANEL
Anion gap: 12 (ref 5–15)
BUN: 17 mg/dL (ref 6–20)
CO2: 30 mmol/L (ref 22–32)
Calcium: 9.6 mg/dL (ref 8.9–10.3)
Chloride: 96 mmol/L — ABNORMAL LOW (ref 98–111)
Creatinine, Ser: 1.23 mg/dL — ABNORMAL HIGH (ref 0.44–1.00)
GFR calc Af Amer: 57 mL/min — ABNORMAL LOW (ref >60)
GFR calc non Af Amer: 49 mL/min — ABNORMAL LOW (ref >60)
Glucose, Bld: 144 mg/dL — ABNORMAL HIGH (ref 70–99)
Potassium: 3.7 mmol/L (ref 3.5–5.1)
Sodium: 138 mmol/L (ref 135–145)

## 2018-12-20 LAB — TROPONIN I (HIGH SENSITIVITY)
Troponin I (High Sensitivity): 12 ng/L (ref <18)
Troponin I (High Sensitivity): 13 ng/L (ref <18)

## 2018-12-20 LAB — SARS CORONAVIRUS 2 BY RT PCR (HOSPITAL ORDER, PERFORMED IN ~~LOC~~ HOSPITAL LAB): SARS Coronavirus 2: NEGATIVE

## 2018-12-20 MED ORDER — ENOXAPARIN SODIUM 40 MG/0.4ML ~~LOC~~ SOLN
40.0000 mg | Freq: Every day | SUBCUTANEOUS | Status: DC
  Administered 2018-12-20 – 2018-12-22 (×3): 40 mg via SUBCUTANEOUS
  Filled 2018-12-20 (×3): qty 0.4

## 2018-12-20 MED ORDER — ONDANSETRON HCL 4 MG/2ML IJ SOLN: 4.0000 mg | Freq: Four times a day (QID) | INTRAMUSCULAR | Status: DC | PRN

## 2018-12-20 MED ORDER — FUROSEMIDE 20 MG PO TABS
40.0000 mg | ORAL_TABLET | Freq: Every day | ORAL | Status: DC
  Administered 2018-12-20 – 2018-12-22 (×3): 40 mg via ORAL
  Filled 2018-12-20 (×4): qty 2

## 2018-12-20 MED ORDER — ONDANSETRON HCL 4 MG PO TABS: 4.0000 mg | ORAL_TABLET | Freq: Four times a day (QID) | ORAL | Status: DC | PRN

## 2018-12-20 MED ORDER — ALBUTEROL SULFATE (2.5 MG/3ML) 0.083% IN NEBU: 2.5000 mg | INHALATION_SOLUTION | RESPIRATORY_TRACT | Status: DC | PRN

## 2018-12-20 MED ORDER — ACETAMINOPHEN 325 MG PO TABS: 650.0000 mg | ORAL_TABLET | Freq: Four times a day (QID) | ORAL | Status: DC | PRN

## 2018-12-20 MED ORDER — ALBUTEROL SULFATE (2.5 MG/3ML) 0.083% IN NEBU
2.5000 mg | INHALATION_SOLUTION | Freq: Three times a day (TID) | RESPIRATORY_TRACT | Status: DC
  Administered 2018-12-20 – 2018-12-22 (×7): 2.5 mg via RESPIRATORY_TRACT
  Filled 2018-12-20 (×7): qty 3

## 2018-12-20 MED ORDER — ALBUTEROL SULFATE (2.5 MG/3ML) 0.083% IN NEBU
2.5000 mg | INHALATION_SOLUTION | RESPIRATORY_TRACT | Status: DC
  Administered 2018-12-20: 08:00:00 2.5 mg via RESPIRATORY_TRACT
  Filled 2018-12-20: qty 3

## 2018-12-20 MED ORDER — AMLODIPINE BESYLATE 5 MG PO TABS
10.0000 mg | ORAL_TABLET | Freq: Every day | ORAL | Status: DC
  Administered 2018-12-20 – 2018-12-22 (×3): 10 mg via ORAL
  Filled 2018-12-20 (×3): qty 2

## 2018-12-20 MED ORDER — MOMETASONE FURO-FORMOTEROL FUM 200-5 MCG/ACT IN AERO
2.0000 | INHALATION_SPRAY | Freq: Two times a day (BID) | RESPIRATORY_TRACT | Status: DC
  Administered 2018-12-20 – 2018-12-22 (×5): 2 via RESPIRATORY_TRACT
  Filled 2018-12-20: qty 8.8

## 2018-12-20 MED ORDER — ACETAMINOPHEN 650 MG RE SUPP: 650.0000 mg | Freq: Four times a day (QID) | RECTAL | Status: DC | PRN

## 2018-12-20 MED ORDER — HYDROCODONE-ACETAMINOPHEN 5-325 MG PO TABS
1.0000 | ORAL_TABLET | Freq: Once | ORAL | Status: AC
  Administered 2018-12-20: 02:00:00 1 via ORAL
  Filled 2018-12-20: qty 1

## 2018-12-20 MED ORDER — HYDRALAZINE HCL 20 MG/ML IJ SOLN: 10.0000 mg | INTRAMUSCULAR | Status: DC | PRN

## 2018-12-20 MED ORDER — METHYLPREDNISOLONE SODIUM SUCC 40 MG IJ SOLR
40.0000 mg | Freq: Two times a day (BID) | INTRAMUSCULAR | Status: DC
  Administered 2018-12-20 – 2018-12-22 (×5): 40 mg via INTRAVENOUS
  Filled 2018-12-20 (×5): qty 1

## 2018-12-20 MED ORDER — LISINOPRIL 20 MG PO TABS
40.0000 mg | ORAL_TABLET | Freq: Every day | ORAL | Status: DC
  Administered 2018-12-20 – 2018-12-22 (×3): 40 mg via ORAL
  Filled 2018-12-20 (×3): qty 2

## 2018-12-20 MED ORDER — ALBUTEROL SULFATE HFA 108 (90 BASE) MCG/ACT IN AERS
8.0000 | INHALATION_SPRAY | Freq: Once | RESPIRATORY_TRACT | Status: AC
  Administered 2018-12-20: 02:00:00 8 via RESPIRATORY_TRACT
  Filled 2018-12-20: qty 6.7

## 2018-12-20 MED ORDER — ROSUVASTATIN CALCIUM 5 MG PO TABS
10.0000 mg | ORAL_TABLET | Freq: Every day | ORAL | Status: DC
  Administered 2018-12-20 – 2018-12-22 (×3): 10 mg via ORAL
  Filled 2018-12-20 (×3): qty 2

## 2018-12-20 NOTE — Progress Notes (Signed)
  Echocardiogram 2D Echocardiogram has been performed.  Kathleen Cameron 12/20/2018, 11:40 AM

## 2018-12-20 NOTE — Progress Notes (Signed)
Pt at 4 L nasal canula at 97% oxygen saturation.  weaned oxygen to 2L nasal canula at 96% oxygen saturation.  Pt denies shortness of breath at this time.  Pt is currently lying in bed, call bell within reach.

## 2018-12-20 NOTE — H&P (Signed)
History and Physical    Kathleen Cameron ZOX:[id number] DOB: 09/07/1965 DOA: 12/20/2018  PCP: Georgina QuintSagardia, Miguel Jose, MD  Patient coming from: Home.  Chief Complaint: Shortness of breath.  HPI: Kathleen Cameron is a 53 y.o. female with history of COPD, hypertension hyperlipidemia presents to the ER because of worsening shortness of breath.  Patient states over the last 2 days patient has been worsening shortness of breath even at rest.  With wheezing some cough denies any fever chills or any sick contacts.  Has some chest tightness with increasing wheezing.  EMS was called and patient was brought to the ER.  On the route patient was given IV Solu-Medrol and nebulizer treatment.  ED Course: In the ER patient was found to be wheezing.  Chest x-ray was showing chronic changes with lung nodules.  EKG was showing sinus tachycardia.  Labs show mild leukocytosis COVID-19 test is pending.  Creatinine is 1.2 at baseline.  Patient was given nebulizer treatment and admitted for COPD exacerbation.  Review of Systems: As per HPI, rest all negative.   Past Medical History:  Diagnosis Date  . Hypertension     History reviewed. No pertinent surgical history.   reports that she has never smoked. She has never used smokeless tobacco. She reports current alcohol use of about 2.0 standard drinks of alcohol per week. She reports that she does not use drugs.  Allergies  Allergen Reactions  . Stiolto Respimat [Tiotropium Bromide-Olodaterol] Other (See Comments)    Severe headaches and rapid heartrate    Family History  Family history unknown: Yes    Prior to Admission medications   Medication Sig Start Date End Date Taking? Authorizing Provider  albuterol (VENTOLIN HFA) 108 (90 Base) MCG/ACT inhaler INHALE 2 PUFFS INTO THE LUNGS EVERY 6 HOURS AS NEEDED FOR WHEEZING OR SHORTNESS OF BREATH 12/07/18   Glenford BayleyWalsh, Elizabeth W, NP  amLODipine (NORVASC) 10 MG tablet Take 1 tablet (10 mg total) by  mouth daily. 09/29/18   Georgina QuintSagardia, Miguel Jose, MD  budesonide-formoterol [facility](SYMBICORT) 160-4.5 MCG/ACT inhaler Inhale 2 puffs into the lungs 2 (two) times daily. 12/07/18   Glenford BayleyWalsh, Elizabeth W, NP  budesonide-formoterol [facility](SYMBICORT) 160-4.5 MCG/ACT inhaler Inhale 2 puffs into the lungs 2 (two) times daily. 12/07/18   Glenford BayleyWalsh, Elizabeth W, NP  dextromethorphan-guaiFENesin [facility](MUCINEX DM) 30-600 MG 12hr tablet Take 1 tablet by mouth 2 (two) times daily as needed for cough. 09/29/18   Georgina QuintSagardia, Miguel Jose, MD  furosemide (LASIX) 40 MG tablet Take 1 tablet by mouth daily. 11/21/18   [provider]  lisinopril (ZESTRIL) 40 MG tablet Take 1 tablet (40 mg total) by mouth daily. 09/29/18 12/28/18  Georgina QuintSagardia, Miguel Jose, MD  rosuvastatin (CRESTOR) 10 MG tablet Take 1 tablet (10 mg total) by mouth daily. 09/29/18   Georgina QuintSagardia, Miguel Jose, MD  Tiotropium Bromide Monohydrate (SPIRIVA RESPIMAT) 2.5 MCG/ACT AERS Inhale 1 puff into the lungs daily. 12/07/18   Glenford BayleyWalsh, Elizabeth W, NP    Physical Exam: Constitutional: Moderately built and nourished. Vitals:   12/20/18 0145 12/20/18 0200 12/20/18 0215 12/20/18 0230  BP: (!) 170/101 (!) 156/97 (!) 158/100 (!) 176/95  Pulse: 96 90 94 89  Resp: (!) 26 (!) 26 (!) 23 (!) 34  Temp:      TempSrc:      SpO2: 100% 99% 99% 97%  Weight:      Height:       Eyes: Anicteric no pallor. ENMT: No discharge from the ears or nose or mouth. Neck:  No mass felt.  No JVD appreciated. Respiratory: Mild expiratory wheeze and no crepitations. Cardiovascular: S1-S2 heard. Abdomen: Soft nontender bowel sounds present. Musculoskeletal: No edema.  No joint effusion. Skin: No rash. Neurologic: Alert awake oriented to time place and person.  Moves all extremities. Psychiatric: Appears normal.  Normal affect.   Labs on Admission: I have personally reviewed following labs and imaging studies  CBC: Recent Labs  Lab 12/20/18 0054  WBC 13.9*  NEUTROABS 8.2*  HGB 13.3  HCT 40.4  MCV 86.9   PLT 298   Basic Metabolic Panel: Recent Labs  Lab 12/20/18 0054  NA 138  K 3.7  CL 96*  CO2 30  GLUCOSE 144*  BUN 17  CREATININE 1.23*  CALCIUM 9.6   GFR: Estimated Creatinine Clearance: 37.1 mL/min (A) (by C-G formula based on SCr of 1.23 mg/dL (H)). Liver Function Tests: No results for input(s): AST, ALT, ALKPHOS, BILITOT, PROT, ALBUMIN in the last 168 hours. No results for input(s): LIPASE, AMYLASE in the last 168 hours. No results for input(s): AMMONIA in the last 168 hours. Coagulation Profile: No results for input(s): INR, PROTIME in the last 168 hours. Cardiac Enzymes: No results for input(s): CKTOTAL, CKMB, CKMBINDEX, TROPONINI in the last 168 hours. BNP (last 3 results) No results for input(s): PROBNP in the last 8760 hours. HbA1C: No results for input(s): HGBA1C in the last 72 hours. CBG: No results for input(s): GLUCAP in the last 168 hours. Lipid Profile: No results for input(s): CHOL, HDL, LDLCALC, TRIG, CHOLHDL, LDLDIRECT in the last 72 hours. Thyroid Function Tests: No results for input(s): TSH, T4TOTAL, FREET4, T3FREE, THYROIDAB in the last 72 hours. Anemia Panel: No results for input(s): VITAMINB12, FOLATE, FERRITIN, TIBC, IRON, RETICCTPCT in the last 72 hours. Urine analysis:    Component Value Date/Time   COLORURINE YELLOW 11/12/2008 1440   APPEARANCEUR CLEAR 11/12/2008 1440   LABSPEC 1.016 11/12/2008 1440   PHURINE 7.5 11/12/2008 1440   GLUCOSEU NEGATIVE 11/12/2008 1440   HGBUR TRACE (A) 11/12/2008 1440   BILIRUBINUR NEGATIVE 11/12/2008 1440   KETONESUR NEGATIVE 11/12/2008 1440   PROTEINUR NEGATIVE 11/12/2008 1440   UROBILINOGEN 1.0 11/12/2008 1440   NITRITE NEGATIVE 11/12/2008 1440   LEUKOCYTESUR SMALL (A) 11/12/2008 1440   Sepsis Labs: @LABRCNTIP (procalcitonin:4,lacticidven:4) ) Recent Results (from the past 240 hour(s))  SARS Coronavirus 2 [facility] order, Performed in [facility] hospital lab) Nasopharyngeal Nasopharyngeal Swab      Status: None   Collection Time: 12/20/18 12:40 AM   Specimen: Nasopharyngeal Swab  Result Value Ref Range Status   SARS Coronavirus 2 NEGATIVE NEGATIVE Final    Comment: (NOTE) If result is NEGATIVE SARS-CoV-2 target nucleic acids are NOT DETECTED. The SARS-CoV-2 RNA is generally detectable in upper and lower  respiratory specimens during the acute phase of infection. The lowest  concentration of SARS-CoV-2 viral copies this assay can detect is 250  copies / mL. A negative result does not preclude SARS-CoV-2 infection  and should not be used as the sole basis for treatment or other  patient management decisions.  A negative result may occur with  improper specimen collection / handling, submission of specimen other  than nasopharyngeal swab, presence of viral mutation(s) within the  areas targeted by this assay, and inadequate number of viral copies  (<250 copies / mL). A negative result must be combined with clinical  observations, patient history, and epidemiological information. If result is POSITIVE SARS-CoV-2 target nucleic acids are DETECTED. The SARS-CoV-2 RNA is generally detectable in upper  and lower  respiratory specimens dur ing the acute phase of infection.  Positive  results are indicative of active infection with SARS-CoV-2.  Clinical  correlation with patient history and other diagnostic information is  necessary to determine patient infection status.  Positive results do  not rule out bacterial infection or co-infection with other viruses. If result is PRESUMPTIVE POSTIVE SARS-CoV-2 nucleic acids MAY BE PRESENT.   A presumptive positive result was obtained on the submitted specimen  and confirmed on repeat testing.  While 2019 novel coronavirus  (SARS-CoV-2) nucleic acids may be present in the submitted sample  additional confirmatory testing may be necessary for epidemiological  and / or clinical management purposes  to differentiate between  SARS-CoV-2 and other  Sarbecovirus currently known to infect humans.  If clinically indicated additional testing with an alternate test  methodology [phone]) is advised. The SARS-CoV-2 RNA is generally  detectable in upper and lower respiratory sp ecimens during the acute  phase of infection. The expected result is Negative. Fact Sheet for Patients:  StrictlyIdeas.no Fact Sheet for Healthcare Providers: BankingDealers.co.za This test is not yet approved or cleared by the [place] FDA and has been authorized for detection and/or diagnosis of SARS-CoV-2 by FDA under an Emergency Use Authorization (EUA).  This EUA will remain in effect (meaning this test can be used) for the duration of the COVID-19 declaration under Section 564(b)(1) of the Act, 21 U.S.C. section 360bbb-3(b)(1), unless the authorization is terminated or revoked sooner. Performed at [facility], [place] [address], [place], [place] [address]      Radiological Exams on Admission: Dg Chest Port 1 View  Result Date: 12/20/2018 CLINICAL DATA:  Shortness of breath. Hypoxemia. EXAM: PORTABLE CHEST 1 VIEW COMPARISON:  Radiograph 12/07/2018. Chest CT 09/23/2018 FINDINGS: The cardiomediastinal contours are unchanged. Heart is normal in size. Right upper lobe pulmonary nodules which are stable radiographically. Additional pulmonary nodules on CT are not well visualized. Pulmonary vasculature is normal. No consolidation, pleural effusion, or pneumothorax. No acute osseous abnormalities are seen. IMPRESSION: No acute findings. Pulmonary nodules in the right upper lung which are stable radiographically from prior imaging. Electronically Signed   By: Keith Rake M.D.   On: 12/20/2018 01:09    EKG: Independently reviewed.  Sinus tachycardia.  Assessment/Plan Principal Problem:   COPD exacerbation (HCC) Active Problems:   Uncontrolled hypertension   Dyslipidemia   Pulmonary nodules   Pulmonary  emphysema ([place])    1. COPD exacerbation -we will keep patient IV Solu-Medrol nebulizer treatment continue patient's Dulera.  COVID-19 test is negative. 2. Hypertension uncontrolled on amlodipine lisinopril and also takes Lasix. 3. Hyperlipidemia on statins. 4. History of mediastinal lymphadenopathy and lung nodules being followed by Dr. Lamonte Sakai pulmonologist.   DVT prophylaxis: Lovenox. Code Status: Full code. Family Communication: Discussed with patient. Disposition Plan: Home. Consults called: None. Admission status: Observation.   Rise Patience MD [facility] Pager [phone].  If 7PM-7AM, please contact night-coverage [email or web address] Password [email or web address]  12/20/2018, 3:07 AM

## 2018-12-20 NOTE — Progress Notes (Signed)
Nutrition Brief Note  Patient identified on the Malnutrition Screening Tool (MST) Report  Wt Readings from Last 15 Encounters:  12/20/18 50.5 kg  12/07/18 49.9 kg  11/15/18 49.9 kg  10/14/18 48.1 kg  09/29/18 47.6 kg  09/27/18 48 kg  09/23/18 52.6 kg  09/14/18 48.3 kg  07/07/18 49.2 kg  06/03/18 49.8 kg  05/06/18 50.4 kg  04/08/18 52 kg  03/10/18 52.4 kg  09/20/13 58.2 kg  09/18/13 61.8 kg   Kathleen Cameron is a 53 y.o. female with history of COPD, hypertension hyperlipidemia presents to the ER because of worsening shortness of breath.  Patient states over the last 2 days patient has been worsening shortness of breath even at rest.  With wheezing some cough denies any fever chills or any sick contacts.  Has some chest tightness with increasing wheezing.  EMS was called and patient was brought to the ER.  On the route patient was given IV Solu-Medrol and nebulizer treatment.  Body mass index is 21.74 kg/m. Patient meets criteria for normal weight range based on current BMI.   Current diet order is Heart Healthy with 1.2 L fluid restriction, patient is consuming approximately 100% of meals at this time. Labs and medications reviewed.   No nutrition interventions warranted at this time. If nutrition issues arise, please consult RD.   Kathleen Cameron A. Jimmye Norman, RD, LDN, [place] Registered Dietitian II Certified Diabetes Care and Education Specialist Pager: [phone] After hours Pager: [phone]

## 2018-12-20 NOTE — Progress Notes (Signed)
Spoke with pt's spouse Versie Starks) with pt's permission regarding plan of care. Questions and concerns answered.

## 2018-12-20 NOTE — ED Triage Notes (Signed)
Pt presents to ED from home BIB [facility]. Per EMS pt c/o SOB, in tripod position, sat at 73% upon arrival. Pt says increased SOB for a few weeks that got worse tonight. Hx, COPD, HTN. EMS VS hypertensive, sat well on NRB.

## 2018-12-20 NOTE — Progress Notes (Signed)
Patient admitted after midnight, please see H&P.  Here with acute exacerbation of her emphysema.  Recent with with Pulm NP:  Pulmonary emphysema (HCC) - Increased dyspnea on exertion - Question fixed asthma. FEV1 0.56 (29%), RATIO 57. - Continue Symbicort 160 two puffs twice daily - Add Spiriva respimat 2.21mcg  Abnormal CT of the chest - CXR today showed no acute cardiopulmonary disease; multiple rounded nodules are again noted in the upper lobes bilaterally, stable since prior study and prior CT - Plan repeat CT chest in December and may consider endobronchial ultrasound/biopsy at that time   Husband and son smoke in the bathroom Denies missing any medications -check echo-- dyspnea with exertion  Came in with Acute respiratory with O2 sats around 73%.  Still requiring O2- wean off to RA.    Eulogio Bear DO

## 2018-12-20 NOTE — Plan of Care (Signed)
  Problem: Clinical Measurements: Goal: Will remain free from infection Outcome: Progressing Goal: Respiratory complications will improve Outcome: Progressing   Problem: Activity: Goal: Risk for activity intolerance will decrease Outcome: Progressing   Problem: Safety: Goal: Ability to remain free from injury will improve Outcome: Progressing   

## 2018-12-20 NOTE — ED Provider Notes (Signed)
[facility] EMERGENCY DEPARTMENT Provider Note   CSN: [id number] Arrival date & time: 12/20/18  0028     History   Chief Complaint Chief Complaint  Patient presents with   Shortness of Breath   Level 5 caveat due to acuity of condition HPI Kathleen Cameron is a 53 y.o. female.     The history is provided by the patient and the EMS personnel. The history is limited by the condition of the patient.  Shortness of Breath Severity:  Severe Onset quality:  Sudden Timing:  Constant Progression:  Worsening Chronicity:  New Relieved by:  Nothing Worsened by:  Nothing Associated symptoms: no fever   Patient presents via EMS for shortness of breath.  EMS found patient in a tripod position with pulse ox in the 70s.  Patient had reported increasing shortness of breath for a few weeks that abruptly worsened tonight.  Reported history of emphysema/asthma due to unknown etiology  She is not on oxygen at home  Past Medical History:  Diagnosis Date   Hypertension     Patient Active Problem List   Diagnosis Date Noted   Upper back pain 12/07/2018   Abnormal CT of the chest 10/14/2018   Pulmonary emphysema ([place]) 09/29/2018   Pulmonary nodules 09/27/2018   Dyspnea 09/27/2018   Dyslipidemia 07/07/2018   Uncontrolled hypertension 03/10/2018    History reviewed. No pertinent surgical history.   OB History   No obstetric history on file.      Home Medications    Prior to Admission medications   Medication Sig Start Date End Date Taking? Authorizing Provider  albuterol (VENTOLIN HFA) 108 (90 Base) MCG/ACT inhaler INHALE 2 PUFFS INTO THE LUNGS EVERY 6 HOURS AS NEEDED FOR WHEEZING OR SHORTNESS OF BREATH 12/07/18   Martyn Ehrich, NP  amLODipine (NORVASC) 10 MG tablet Take 1 tablet (10 mg total) by mouth daily. 09/29/18   Horald Pollen, MD  budesonide-formoterol [facility]) 160-4.5 MCG/ACT inhaler Inhale 2 puffs into the lungs 2 (two)  times daily. 12/07/18   Martyn Ehrich, NP  budesonide-formoterol [facility]) 160-4.5 MCG/ACT inhaler Inhale 2 puffs into the lungs 2 (two) times daily. 12/07/18   Martyn Ehrich, NP  dextromethorphan-guaiFENesin [facility] DM) 30-600 MG 12hr tablet Take 1 tablet by mouth 2 (two) times daily as needed for cough. 09/29/18   Horald Pollen, MD  furosemide (LASIX) 40 MG tablet Take 1 tablet by mouth daily. 11/21/18   [provider]  lisinopril (ZESTRIL) 40 MG tablet Take 1 tablet (40 mg total) by mouth daily. 09/29/18 12/28/18  Horald Pollen, MD  rosuvastatin (CRESTOR) 10 MG tablet Take 1 tablet (10 mg total) by mouth daily. 09/29/18   Horald Pollen, MD  Tiotropium Bromide Monohydrate (SPIRIVA RESPIMAT) 2.5 MCG/ACT AERS Inhale 1 puff into the lungs daily. 12/07/18   Martyn Ehrich, NP    Family History History reviewed. No pertinent family history.  Social History Social History   Tobacco Use   Smoking status: Never Smoker   Smokeless tobacco: Never Used  Substance Use Topics   Alcohol use: Yes    Alcohol/week: 2.0 standard drinks    Types: 2 Cans of beer per week   Drug use: Never     Allergies   Stiolto respimat [tiotropium bromide-olodaterol]   Review of Systems Review of Systems  Unable to perform ROS: Acuity of condition  Constitutional: Negative for fever.  Respiratory: Positive for shortness of breath.  Physical Exam Updated Vital Signs Ht 1.524 m (5')    Wt 54.4 kg    SpO2 100%    BMI 23.44 kg/m   Physical Exam CONSTITUTIONAL: Ill-appearing, respiratory distress HEAD: Normocephalic/atraumatic EYES: EOMI/PERRL ENMT: Mucous membranes moist NECK: supple no meningeal signs SPINE/BACK:entire spine nontender CV: Tachycardic LUNGS: Diminished breath sounds bilaterally, tachypnea, distress noted ABDOMEN: soft, nontender, no rebound or guarding, bowel sounds noted throughout abdomen GU:no cva tenderness NEURO: Pt is  awake/alert/appropriate, moves all extremitiesx4.  No facial droop.   EXTREMITIES: pulses normal/equal, full ROM, no lower extremity SKIN: warm, color normal PSYCH: Anxious  ED Treatments / Results  Labs (all labs ordered are listed, but only abnormal results are displayed) Labs Reviewed  BASIC METABOLIC PANEL - Abnormal; Notable for the following components:      Result Value   Chloride 96 (*)    Glucose, Bld 144 (*)    Creatinine, Ser 1.23 (*)    GFR calc non Af Amer 49 (*)    GFR calc Af Amer 57 (*)    All other components within normal limits  CBC WITH DIFFERENTIAL/PLATELET - Abnormal; Notable for the following components:   WBC 13.9 (*)    Neutro Abs 8.2 (*)    Lymphs Abs 4.6 (*)    All other components within normal limits  SARS CORONAVIRUS 2 (HOSPITAL ORDER, PERFORMED IN [facility])    EKG EKG Interpretation  Date/Time:  Monday December 20 2018 00:30:55 EDT Ventricular Rate:  111 PR Interval:    QRS Duration: 80 QT Interval:  319 QTC Calculation: 434 R Axis:   -4 Text Interpretation:  Sinus tachycardia Right atrial enlargement Anterior infarct, old Abnormal ekg Interpretation limited secondary to artifact Confirmed by Zadie RhineWickline, Nissa Stannard ([id number]) on 12/20/2018 12:40:50 AM   Radiology Dg Chest Port 1 View  Result Date: 12/20/2018 CLINICAL DATA:  Shortness of breath. Hypoxemia. EXAM: PORTABLE CHEST 1 VIEW COMPARISON:  Radiograph 12/07/2018. Chest CT 09/23/2018 FINDINGS: The cardiomediastinal contours are unchanged. Heart is normal in size. Right upper lobe pulmonary nodules which are stable radiographically. Additional pulmonary nodules on CT are not well visualized. Pulmonary vasculature is normal. No consolidation, pleural effusion, or pneumothorax. No acute osseous abnormalities are seen. IMPRESSION: No acute findings. Pulmonary nodules in the right upper lung which are stable radiographically from prior imaging. Electronically Signed   By: Narda RutherfordMelanie  Sanford  M.D.   On: 12/20/2018 01:09    Procedures .Critical Care Performed by: Zadie RhineWickline, Burnis Halling, MD Authorized by: Zadie RhineWickline, Coran Dipaola, MD   Critical care provider statement:    Critical care time (minutes):  45   Critical care start time:  12/20/2018 12:45 AM   Critical care end time:  12/20/2018 1:30 AM   Critical care time was exclusive of:  Separately billable procedures and treating other patients   Critical care was necessary to treat or prevent imminent or life-threatening deterioration of the following conditions:  Respiratory failure   Critical care was time spent personally by me on the following activities:  Evaluation of patient's response to treatment, examination of patient, development of treatment plan with patient or surrogate, re-evaluation of patient's condition, pulse oximetry, ordering and review of radiographic studies, review of old charts, ordering and performing treatments and interventions and ordering and review of laboratory studies   Medications Ordered in ED Medications  HYDROcodone-acetaminophen (NORCO/VICODIN) 5-325 MG per tablet 1 tablet (has no administration in time range)  albuterol (VENTOLIN HFA) 108 (90 Base) MCG/ACT inhaler 8 puff (8 puffs Inhalation Given  12/20/18 0147)     Initial Impression / Assessment and Plan / ED Course  I have reviewed the triage vital signs and the nursing notes.  Pertinent labs & imaging results that were available during my care of the patient were reviewed by me and considered in my medical decision making (see chart for details).        12:47 AM Patient seen on arrival for shortness of breath.  Reported history of COPD, had already received Solu-Medrol and EpiPen prior to arrival by EMS Patient has responded to oxygen, but she is still tachypneic, will follow closely 1:12 AM Patient is starting to improve.  Her work of breathing is improved.  X-ray shows stable pulmonary nodules 1:50 AM Patient becoming more tachypneic, but  she is still able to speak clearly.  She reports ongoing worsening shortness of breath over the past several weeks. I feel she should be admitted for further treatment and monitoring.  She is still on nasal cannula She will have further treatment in hospital.  Patient we will plan.  She is having some upper back pain, will give Vicodin.  No other acute complaints 2:03 AM Discussed with Dr. Toniann Fail for admission  Kathleen Cameron was evaluated in Emergency Department on 12/20/2018 for the symptoms described in the history of present illness. She was evaluated in the context of the global COVID-19 pandemic, which necessitated consideration that the patient might be at risk for infection with the SARS-CoV-2 virus that causes COVID-19. Institutional protocols and algorithms that pertain to the evaluation of patients at risk for COVID-19 are in a state of rapid change based on information released by regulatory bodies including the CDC and federal and state organizations. These policies and algorithms were followed during the patient's care in the ED.  Final Clinical Impressions(s) / ED Diagnoses   Final diagnoses:  Hypoxia  Acute respiratory failure with hypoxia [facility])  COPD exacerbation [facility])    ED Discharge Orders    None       Zadie Rhine, MD 12/20/18 [phone]

## 2018-12-20 NOTE — ED Notes (Signed)
ED TO INPATIENT HANDOFF REPORT  ED Nurse Name and Phone #: [phone] Threasa Beards, RN  S Name/Age/Gender Kathleen Cameron 53 y.o. female Room/Bed: 022C/022C  Code Status   Code Status: Not on file  Home/SNF/Other Home Patient oriented to: self, place, time and situation Is this baseline? Yes   Triage Complete: Triage complete  Chief Complaint copd  Triage Note Pt presents to ED from home BIB [facility]. Per EMS pt c/o SOB, in tripod position, sat at 73% upon arrival. Pt says increased SOB for a few weeks that got worse tonight. Hx, COPD, HTN. EMS VS hypertensive, sat well on NRB.   Allergies Allergies  Allergen Reactions  . Stiolto Respimat [Tiotropium Bromide-Olodaterol] Other (See Comments)    Severe headaches and rapid heartrate    Level of Care/Admitting Diagnosis ED Disposition    ED Disposition Condition Comment   Admit  Hospital Area: [facility] [[id number]]  Level of Care: Telemetry Medical [104]  I expect the patient will be discharged within 24 hours: No (not a candidate for 5C-Observation unit)  Covid Evaluation: N/A  Diagnosis: COPD exacerbation [facility]) [[id number]]  Admitting Physician: Rise Patience [phone]  Attending Physician: Rise Patience [email or web address].Right  PT Class (Do Not Modify): Observation [104]  PT Acc Code (Do Not Modify): Observation [10022]       B Medical/Surgery History Past Medical History:  Diagnosis Date  . Hypertension    History reviewed. No pertinent surgical history.   A IV Location/Drains/Wounds Patient Lines/Drains/Airways Status   Active Line/Drains/Airways    Name:   Placement date:   Placement time:   Site:   Days:   Peripheral IV 12/20/18 Left Antecubital   12/20/18    0029    Antecubital   less than 1          Intake/Output Last 24 hours No intake or output data in the 24 hours ending 12/20/18 0316  Labs/Imaging Results for orders placed or performed during the hospital encounter of 12/20/18 (from  the past 48 hour(s))  SARS Coronavirus 2 [facility] order, Performed in [facility] hospital lab) Nasopharyngeal Nasopharyngeal Swab     Status: None   Collection Time: 12/20/18 12:40 AM   Specimen: Nasopharyngeal Swab  Result Value Ref Range   SARS Coronavirus 2 NEGATIVE NEGATIVE    Comment: (NOTE) If result is NEGATIVE SARS-CoV-2 target nucleic acids are NOT DETECTED. The SARS-CoV-2 RNA is generally detectable in upper and lower  respiratory specimens during the acute phase of infection. The lowest  concentration of SARS-CoV-2 viral copies this assay can detect is 250  copies / mL. A negative result does not preclude SARS-CoV-2 infection  and should not be used as the sole basis for treatment or other  patient management decisions.  A negative result may occur with  improper specimen collection / handling, submission of specimen other  than nasopharyngeal swab, presence of viral mutation(s) within the  areas targeted by this assay, and inadequate number of viral copies  (<250 copies / mL). A negative result must be combined with clinical  observations, patient history, and epidemiological information. If result is POSITIVE SARS-CoV-2 target nucleic acids are DETECTED. The SARS-CoV-2 RNA is generally detectable in upper and lower  respiratory specimens dur ing the acute phase of infection.  Positive  results are indicative of active infection with SARS-CoV-2.  Clinical  correlation with patient history and other diagnostic information is  necessary to determine patient infection status.  Positive results do  not rule out  bacterial infection or co-infection with other viruses. If result is PRESUMPTIVE POSTIVE SARS-CoV-2 nucleic acids MAY BE PRESENT.   A presumptive positive result was obtained on the submitted specimen  and confirmed on repeat testing.  While 2019 novel coronavirus  (SARS-CoV-2) nucleic acids may be present in the submitted sample  additional confirmatory testing may  be necessary for epidemiological  and / or clinical management purposes  to differentiate between  SARS-CoV-2 and other Sarbecovirus currently known to infect humans.  If clinically indicated additional testing with an alternate test  methodology [phone]([id number]) is advised. The SARS-CoV-2 RNA is generally  detectable in upper and lower respiratory sp ecimens during the acute  phase of infection. The expected result is Negative. Fact Sheet for Patients:  BoilerBrush.com.cyhttps://www.fda.gov/media/136312/download Fact Sheet for Healthcare Providers: https://pope.com/https://www.fda.gov/media/136313/download This test is not yet approved or cleared by the [place] FDA and has been authorized for detection and/or diagnosis of SARS-CoV-2 by FDA under an Emergency Use Authorization (EUA).  This EUA will remain in effect (meaning this test can be used) for the duration of the COVID-19 declaration under Section 564(b)(1) of the Act, 21 U.S.C. section 360bbb-3(b)(1), unless the authorization is terminated or revoked sooner. Performed at [facility], [address] [place], [place] [address]   Basic metabolic panel     Status: Abnormal   Collection Time: 12/20/18 12:54 AM  Result Value Ref Range   Sodium 138 135 - 145 mmol/L   Potassium 3.7 3.5 - 5.1 mmol/L   Chloride 96 (L) 98 - 111 mmol/L   CO2 30 22 - 32 mmol/L   Glucose, Bld 144 (H) 70 - 99 mg/dL   BUN 17 6 - 20 mg/dL   Creatinine, Ser 8.111.23 (H) 0.44 - 1.00 mg/dL   Calcium 9.6 8.9 - 91.410.3 mg/dL   GFR calc non Af Amer 49 (L) >60 mL/min   GFR calc Af Amer 57 (L) >60 mL/min   Anion gap 12 5 - 15    Comment: Performed at [facility], [address] [place], [place] [address]  CBC with Differential/Platelet     Status: Abnormal   Collection Time: 12/20/18 12:54 AM  Result Value Ref Range   WBC 13.9 (H) 4.0 - 10.5 K/uL   RBC 4.65 3.87 - 5.11 MIL/uL   Hemoglobin 13.3 12.0 - 15.0 g/dL   HCT 62.140.4 30.836.0 - 65.746.0 %   MCV 86.9 80.0 - 100.0 fL   MCH 28.6 26.0 -  34.0 pg   MCHC 32.9 30.0 - 36.0 g/dL   RDW 84.611.9 96.211.5 - 95.215.5 %   Platelets 298 150 - 400 K/uL   nRBC 0.0 0.0 - 0.2 %   Neutrophils Relative % 58 %   Neutro Abs 8.2 (H) 1.7 - 7.7 K/uL   Lymphocytes Relative 33 %   Lymphs Abs 4.6 (H) 0.7 - 4.0 K/uL   Monocytes Relative 5 %   Monocytes Absolute 0.6 0.1 - 1.0 K/uL   Eosinophils Relative 3 %   Eosinophils Absolute 0.4 0.0 - 0.5 K/uL   Basophils Relative 1 %   Basophils Absolute 0.1 0.0 - 0.1 K/uL   Immature Granulocytes 0 %   Abs Immature Granulocytes 0.05 0.00 - 0.07 K/uL    Comment: Performed at [facility], [address] [place], [place] [address]   Dg Chest Port 1 View  Result Date: 12/20/2018 CLINICAL DATA:  Shortness of breath. Hypoxemia. EXAM: PORTABLE CHEST 1 VIEW COMPARISON:  Radiograph 12/07/2018. Chest CT  09/23/2018 FINDINGS: The cardiomediastinal contours are unchanged. Heart is normal in size. Right upper lobe pulmonary nodules which are stable radiographically. Additional pulmonary nodules on CT are not well visualized. Pulmonary vasculature is normal. No consolidation, pleural effusion, or pneumothorax. No acute osseous abnormalities are seen. IMPRESSION: No acute findings. Pulmonary nodules in the right upper lung which are stable radiographically from prior imaging. Electronically Signed   By: Narda Rutherford M.D.   On: 12/20/2018 01:09    Pending Labs [employer] (From admission, onward)    Start     Ordered   Signed and Held  CBC  (enoxaparin (LOVENOX)    CrCl >/= 30 ml/min)  Once,   R    Comments: Baseline for enoxaparin therapy IF NOT ALREADY DRAWN.  Notify MD if PLT < 100 K.    Signed and Held   Signed and Held  Creatinine, serum  (enoxaparin (LOVENOX)    CrCl >/= 30 ml/min)  Once,   R    Comments: Baseline for enoxaparin therapy IF NOT ALREADY DRAWN.    Signed and Held   Signed and Held  Creatinine, serum  (enoxaparin (LOVENOX)    CrCl >/= 30 ml/min)  Weekly,   R    Comments: while on enoxaparin  therapy    Signed and Held   Signed and Held  Comprehensive metabolic panel  Once,   R     Signed and Held   Signed and Held  CBC WITH DIFFERENTIAL  Once,   R     Signed and Held          Vitals/Pain Today's Vitals   12/20/18 0200 12/20/18 0215 12/20/18 0227 12/20/18 0230  BP: (!) 156/97 (!) 158/100  (!) 176/95  Pulse: 90 94  89  Resp: (!) 26 (!) 23  (!) 34  Temp:      TempSrc:      SpO2: 99% 99%  97%  Weight:      Height:      PainSc:   5      Isolation Precautions No active isolations  Medications Medications  hydrALAZINE (APRESOLINE) injection 10 mg (has no administration in time range)  albuterol (VENTOLIN HFA) 108 (90 Base) MCG/ACT inhaler 8 puff (8 puffs Inhalation Given 12/20/18 0147)  HYDROcodone-acetaminophen (NORCO/VICODIN) 5-325 MG per tablet 1 tablet (1 tablet Oral Given 12/20/18 0227)    Mobility walks Low fall risk   Focused Assessments Pulmonary Assessment Handoff:  Lung sounds: L Breath Sounds: Expiratory wheezes, Inspiratory wheezes R Breath Sounds: Inspiratory wheezes, Expiratory wheezes          R Recommendations: See Admitting Provider Note  Report given to:   Additional Notes:

## 2018-12-21 DIAGNOSIS — J9621 Acute and chronic respiratory failure with hypoxia: Secondary | ICD-10-CM | POA: Diagnosis present

## 2018-12-21 DIAGNOSIS — E785 Hyperlipidemia, unspecified: Secondary | ICD-10-CM | POA: Diagnosis present

## 2018-12-21 DIAGNOSIS — J439 Emphysema, unspecified: Secondary | ICD-10-CM | POA: Diagnosis not present

## 2018-12-21 DIAGNOSIS — J441 Chronic obstructive pulmonary disease with (acute) exacerbation: Secondary | ICD-10-CM | POA: Diagnosis present

## 2018-12-21 DIAGNOSIS — I129 Hypertensive chronic kidney disease with stage 1 through stage 4 chronic kidney disease, or unspecified chronic kidney disease: Secondary | ICD-10-CM | POA: Diagnosis present

## 2018-12-21 DIAGNOSIS — N183 Chronic kidney disease, stage 3 (moderate): Secondary | ICD-10-CM | POA: Diagnosis present

## 2018-12-21 DIAGNOSIS — Z888 Allergy status to other drugs, medicaments and biological substances status: Secondary | ICD-10-CM | POA: Diagnosis not present

## 2018-12-21 DIAGNOSIS — Z7951 Long term (current) use of inhaled steroids: Secondary | ICD-10-CM | POA: Diagnosis not present

## 2018-12-21 DIAGNOSIS — J9601 Acute respiratory failure with hypoxia: Secondary | ICD-10-CM | POA: Diagnosis present

## 2018-12-21 DIAGNOSIS — R918 Other nonspecific abnormal finding of lung field: Secondary | ICD-10-CM | POA: Diagnosis present

## 2018-12-21 DIAGNOSIS — Z20828 Contact with and (suspected) exposure to other viral communicable diseases: Secondary | ICD-10-CM | POA: Diagnosis present

## 2018-12-21 DIAGNOSIS — R0902 Hypoxemia: Secondary | ICD-10-CM | POA: Diagnosis present

## 2018-12-21 DIAGNOSIS — Z79899 Other long term (current) drug therapy: Secondary | ICD-10-CM | POA: Diagnosis not present

## 2018-12-21 NOTE — Progress Notes (Signed)
Progress Note    Kathleen Cameron  IOE:[id number] DOB: [birth year]  DOA: 12/20/2018 PCP: Horald Pollen, MD    Brief Narrative:    Medical records reviewed and are as summarized below:  Kathleen Cameron is an 53 y.o. female with history of COPD, hypertension hyperlipidemia presents to the ER because of worsening shortness of breath.  Patient states over the last 2 days patient has been worsening shortness of breath even at rest.  With wheezing some cough denies any fever chills or any sick contacts.  Has some chest tightness with increasing wheezing.  EMS was called and patient was brought to the ER.  On the route patient was given IV Solu-Medrol and nebulizer treatment.  Assessment/Plan:   Principal Problem:   COPD exacerbation (HCC) Active Problems:   Uncontrolled hypertension   Dyslipidemia   Pulmonary nodules   Pulmonary emphysema (HCC)   Acute respiratory failure due to hypoxemia -down into the 70s with EMS -patient has been weaned from 4L to 2L -echo: hyperdynamic systolic function, with an ejection fraction of >65%. The cavity size was normal. There is mildly increased left ventricular wall thickness. Left ventricular diastolic Doppler parameters are consistent with impaired relaxation. No evidence of left ventricular regional wall motion abnormalities.  COPD exacerbation (has PFTs with ? Emphysema/fixed asthma) vs sarcoidosis - IV Solu-Medrol - wean as tolerated -nebulizer - continue patient's Dulera -COVID-19 test is negative.  Hypertension  - amlodipine, lisinopril, Lasix.  Hyperlipidemia  -statins.  History of mediastinal lymphadenopathy and lung nodules being followed by Dr. Lamonte Sakai pulmonologist -sarcoidosis suspected but has not been confirmed  Renal insufficiency -recheck labs in AM  Hyperglycemia -? From steroids -check HgbA1c   Family Communication/Anticipated D/C date and plan/Code Status   DVT prophylaxis: Lovenox ordered.  Code Status: Full Code.  Family Communication:  Disposition Plan: home once able to transition to PO steroids and wean off O2   Medical Consultants:    None.   Subjective:   Still getting very short of breath with exertion  Objective:    Vitals:   12/21/18 0128 12/21/18 0416 12/21/18 0538 12/21/18 0833  BP: 121/86 (!) 130/93  (!) 156/80  Pulse: 72 68  67  Resp: 20 20  18   Temp: 98.2 F (36.8 C) 98.3 F (36.8 C)  97.8 F (36.6 C)  TempSrc: Oral Oral  Oral  SpO2: 94% 94%  98%  Weight:   49.8 kg   Height:        Intake/Output Summary (Last 24 hours) at 12/21/2018 0852 Last data filed at 12/21/2018 0824 Gross per 24 hour  Intake 720 ml  Output 700 ml  Net 20 ml   Filed Weights   12/20/18 0034 12/20/18 0416 12/21/18 0538  Weight: 54.4 kg 50.5 kg 49.8 kg    Exam: Thin female- 2L [place] Diminished breath sounds- no wheezing but mild work of breathing No LE Edema A+OX3  Data Reviewed:   I have personally reviewed following labs and imaging studies:  Labs: Labs show the following:   Basic Metabolic Panel: Recent Labs  Lab 12/20/18 0054 12/20/18 0412  NA 138 138  K 3.7 3.5  CL 96* 96*  CO2 30 29  GLUCOSE 144* 136*  BUN 17 20  CREATININE 1.23* 1.32*  CALCIUM 9.6 9.6   GFR Estimated Creatinine Clearance: 34.6 mL/min (A) (by C-G formula based on SCr of 1.32 mg/dL (H)). Liver Function Tests: Recent Labs  Lab 12/20/18 0412  AST 20  ALT 13  ALKPHOS 65  BILITOT 0.5  PROT 8.6*  ALBUMIN 4.3   No results for input(s): LIPASE, AMYLASE in the last 168 hours. No results for input(s): AMMONIA in the last 168 hours. Coagulation profile No results for input(s): INR, PROTIME in the last 168 hours.  CBC: Recent Labs  Lab 12/20/18 0054 12/20/18 0412  WBC 13.9* 11.3*  NEUTROABS 8.2* 10.8*  HGB 13.3 12.7  HCT 40.4 38.6  MCV 86.9 87.7  PLT 298 264   Cardiac Enzymes: No results for input(s): CKTOTAL, CKMB, CKMBINDEX, TROPONINI in the last 168 hours. BNP  (last 3 results) No results for input(s): PROBNP in the last 8760 hours. CBG: No results for input(s): GLUCAP in the last 168 hours. D-Dimer: No results for input(s): DDIMER in the last 72 hours. Hgb A1c: No results for input(s): HGBA1C in the last 72 hours. Lipid Profile: No results for input(s): CHOL, HDL, LDLCALC, TRIG, CHOLHDL, LDLDIRECT in the last 72 hours. Thyroid function studies: No results for input(s): TSH, T4TOTAL, T3FREE, THYROIDAB in the last 72 hours.  Invalid input(s): FREET3 Anemia work up: No results for input(s): VITAMINB12, FOLATE, FERRITIN, TIBC, IRON, RETICCTPCT in the last 72 hours. Sepsis Labs: Recent Labs  Lab 12/20/18 0054 12/20/18 0412  WBC 13.9* 11.3*    Microbiology Recent Results (from the past 240 hour(s))  SARS Coronavirus 2 [facility](Hospital order, [facility]) Nasopharyngeal Nasopharyngeal Swab     Status: None   Collection Time: 12/20/18 12:40 AM   Specimen: Nasopharyngeal Swab  Result Value Ref Range Status   SARS Coronavirus 2 NEGATIVE NEGATIVE Final    Comment: (NOTE) If result is NEGATIVE SARS-CoV-2 target nucleic acids are NOT DETECTED. The SARS-CoV-2 RNA is generally detectable in upper and lower  respiratory specimens during the acute phase of infection. The lowest  concentration of SARS-CoV-2 viral copies this assay can detect is 250  copies / mL. A negative result does not preclude SARS-CoV-2 infection  and should not be used as the sole basis for treatment or other  patient management decisions.  A negative result may occur with  improper specimen collection / handling, submission of specimen other  than nasopharyngeal swab, presence of viral mutation(s) within the  areas targeted by this assay, and inadequate number of viral copies  (<250 copies / mL). A negative result must be combined with clinical  observations, patient history, and epidemiological information. If result is POSITIVE SARS-CoV-2 target  nucleic acids are DETECTED. The SARS-CoV-2 RNA is generally detectable in upper and lower  respiratory specimens dur ing the acute phase of infection.  Positive  results are indicative of active infection with SARS-CoV-2.  Clinical  correlation with patient history and other diagnostic information is  necessary to determine patient infection status.  Positive results do  not rule out bacterial infection or co-infection with other viruses. If result is PRESUMPTIVE POSTIVE SARS-CoV-2 nucleic acids MAY BE PRESENT.   A presumptive positive result was obtained on the submitted specimen  and confirmed on repeat testing.  While 2019 novel coronavirus  (SARS-CoV-2) nucleic acids may be present in the submitted sample  additional confirmatory testing may be necessary for epidemiological  and / or clinical management purposes  to differentiate between  SARS-CoV-2 and other Sarbecovirus currently known to infect humans.  If clinically indicated additional testing with an alternate test  methodology [phone]([id number]) is advised. The SARS-CoV-2 RNA is generally  detectable in upper and lower respiratory sp ecimens during the acute  phase of infection. The  expected result is Negative. Fact Sheet for Patients:  BoilerBrush.com.cy Fact Sheet for Healthcare Providers: https://pope.com/ This test is not yet approved or cleared by the [place] FDA and has been authorized for detection and/or diagnosis of SARS-CoV-2 by FDA under an Emergency Use Authorization (EUA).  This EUA will remain in effect (meaning this test can be used) for the duration of the COVID-19 declaration under Section 564(b)(1) of the Act, 21 U.S.C. section 360bbb-3(b)(1), unless the authorization is terminated or revoked sooner. Performed at [facility], [address] [place], [place] [address]     Procedures and diagnostic studies:  Dg Chest Port 1 View  Result Date:  12/20/2018 CLINICAL DATA:  Shortness of breath. Hypoxemia. EXAM: PORTABLE CHEST 1 VIEW COMPARISON:  Radiograph 12/07/2018. Chest CT 09/23/2018 FINDINGS: The cardiomediastinal contours are unchanged. Heart is normal in size. Right upper lobe pulmonary nodules which are stable radiographically. Additional pulmonary nodules on CT are not well visualized. Pulmonary vasculature is normal. No consolidation, pleural effusion, or pneumothorax. No acute osseous abnormalities are seen. IMPRESSION: No acute findings. Pulmonary nodules in the right upper lung which are stable radiographically from prior imaging. Electronically Signed   By: Narda Rutherford M.D.   On: 12/20/2018 01:09    Medications:   . albuterol  2.5 mg Nebulization TID  . amLODipine  10 mg Oral Daily  . enoxaparin (LOVENOX) injection  40 mg Subcutaneous Daily  . furosemide  40 mg Oral Daily  . lisinopril  40 mg Oral Daily  . methylPREDNISolone (SOLU-MEDROL) injection  40 mg Intravenous Q12H  . mometasone-formoterol  2 puff Inhalation BID  . rosuvastatin  10 mg Oral Daily   Continuous Infusions:   LOS: 0 days   Joseph Art  [facility]   How to contact the [facility] Attending or Consulting provider 7A - 7P or covering provider during after hours 7P -7A, for this patient?  1. Check the care team in [facility] and look for a) attending/consulting [facility] provider listed and b) the [facility] team listed 2. Log into [email or web address] and use [facility]'s universal password to access. If you do not have the password, please contact the hospital operator. 3. Locate the [facility] provider you are looking for under [facility] and page to a number that you can be directly reached. 4. If you still have difficulty reaching the provider, please page the [facility] (Director on Call) for the Hospitalists listed on amion for assistance.  12/21/2018, 8:52 AM

## 2018-12-21 NOTE — Progress Notes (Signed)
Pt brought inhalers for MD's approval. Symbicort 160/4.5 daily and Spiriva 2.5 daily.

## 2018-12-22 DIAGNOSIS — R918 Other nonspecific abnormal finding of lung field: Secondary | ICD-10-CM

## 2018-12-22 DIAGNOSIS — J439 Emphysema, unspecified: Secondary | ICD-10-CM

## 2018-12-22 DIAGNOSIS — J9601 Acute respiratory failure with hypoxia: Secondary | ICD-10-CM

## 2018-12-22 LAB — CBC
HCT: 36.8 % (ref 36.0–46.0)
Hemoglobin: 12.2 g/dL (ref 12.0–15.0)
MCH: 29 pg (ref 26.0–34.0)
MCHC: 33.2 g/dL (ref 30.0–36.0)
MCV: 87.6 fL (ref 80.0–100.0)
Platelets: 255 10*3/uL (ref 150–400)
RBC: 4.2 MIL/uL (ref 3.87–5.11)
RDW: 12.3 % (ref 11.5–15.5)
WBC: 8.4 10*3/uL (ref 4.0–10.5)
nRBC: 0 % (ref 0.0–0.2)

## 2018-12-22 LAB — HEMOGLOBIN A1C
Hgb A1c MFr Bld: 5.5 % (ref 4.8–5.6)
Mean Plasma Glucose: 111.15 mg/dL

## 2018-12-22 LAB — BASIC METABOLIC PANEL
Anion gap: 9 (ref 5–15)
BUN: 29 mg/dL — ABNORMAL HIGH (ref 6–20)
CO2: 30 mmol/L (ref 22–32)
Calcium: 9.5 mg/dL (ref 8.9–10.3)
Chloride: 99 mmol/L (ref 98–111)
Creatinine, Ser: 1.12 mg/dL — ABNORMAL HIGH (ref 0.44–1.00)
GFR calc Af Amer: >60 mL/min (ref >60)
GFR calc non Af Amer: 55 mL/min — ABNORMAL LOW (ref >60)
Glucose, Bld: 123 mg/dL — ABNORMAL HIGH (ref 70–99)
Potassium: 4.3 mmol/L (ref 3.5–5.1)
Sodium: 138 mmol/L (ref 135–145)

## 2018-12-22 MED ORDER — ALBUTEROL SULFATE HFA 108 (90 BASE) MCG/ACT IN AERS: INHALATION_SPRAY | RESPIRATORY_TRACT | 0 refills | Status: DC

## 2018-12-22 MED ORDER — PREDNISONE 10 MG PO TABS: 60.0000 mg | ORAL_TABLET | Freq: Every day | ORAL | 0 refills | Status: DC

## 2018-12-22 MED ORDER — ACETAMINOPHEN 325 MG PO TABS: 650.0000 mg | ORAL_TABLET | Freq: Four times a day (QID) | ORAL | Status: DC | PRN

## 2018-12-22 MED FILL — VENTOLIN HFA 90 MCG INHALER: 108 (90 BAS | 15 days supply | Qty: 18 | Fill #0

## 2018-12-22 MED FILL — predniSONE 10 MG TABS: 10 | 6 days supply | Qty: 21 | Fill #0

## 2018-12-22 NOTE — Plan of Care (Signed)

## 2018-12-22 NOTE — Discharge Summary (Signed)
Physician Discharge Summary  Kathleen SchlatterJacqueline B Milian ZOX:[id number] DOB: 10/16/1965 DOA: 12/20/2018  PCP: Georgina QuintSagardia, Miguel Jose, MD  Admit date: 12/20/2018 Discharge date: 12/22/2018  Admitted From: home Disposition:  home   Recommendations for Outpatient Follow-up:  1. Wean O2 as able.  Home Health: none Equipment/Devices:  none    Discharge Condition:  stable   CODE STATUS:  Full code   Diet recommendation:  Heart healthy Consultations:  none    Discharge Diagnoses:  Principal Problem:   COPD exacerbation (HCC) Active Problems:   Acute respiratory failure with hypoxia (HCC)   Uncontrolled hypertension   Dyslipidemia   Pulmonary nodules   Brief Summary: Kathleen Cameron is an 53 y.o. female withhistory of COPD, hypertension hyperlipidemia presents to the ER because of worsening shortness of breath. Patient states over the last 2 days patient has been worsening shortness of breath even at rest. With wheezing some cough denies any fever chills or any sick contacts. Has some chest tightness with increasing wheezing. EMS was called and patient was brought to the ER. On the route patient was given IV Solu-Medrol and nebulizer treatment.   Hospital Course:  COPD exacerbation-acute respiratory failure with hypoxemia -She has never smoked and has had passive exposure - Patient has a mild cough but no wheezing on exam today -She is ambulated with the nurse who stated that the patient did not have much shortness of breath however her pulse ox did drop to 87% and she required 2 L of oxygen to keep above 90% -I will discharge her with home oxygen-she can continue Breo Ellipta and Spiriva- I will order a rescue inhaler for her as she does not have this- when her Brio Ellipta finishes, [facility] she has been receiving in the hospital  Hypertension - Continue amlodipine and lisinopril and furosemide  CKD stage III   -Stable  Pulmonary nodules -  These need to be followed up as outpatient- Per report they are stable from prior CT  Discharge Exam: Vitals:   12/22/18 1304 12/22/18 1343  BP:    Pulse: 83   Resp:    Temp:    SpO2: (!) 84% 92%   Vitals:   12/22/18 0804 12/22/18 1112 12/22/18 1304 12/22/18 1343  BP: (!) 161/88 (!) 155/81    Pulse: 90 77 83   Resp: 16 16    Temp: 98.3 F (36.8 C) 97.7 F (36.5 C)    TempSrc: Oral Oral    SpO2: 96% 97% (!) 84% 92%  Weight:      Height:        General: Pt is alert, awake, not in acute distress Cardiovascular: RRR, S1/S2 +, no rubs, no gallops Respiratory: CTA bilaterally, no wheezing, no rhonchi Abdominal: Soft, NT, ND, bowel sounds + Extremities: no edema, no cyanosis   Discharge Instructions  Discharge Instructions    Diet - low sodium heart healthy   Complete by: As directed    Increase activity slowly   Complete by: As directed    Increase activity slowly   Complete by: As directed      Allergies as of 12/22/2018      Reactions   Stiolto Respimat [tiotropium Bromide-olodaterol] Other (See Comments)   Severe headaches and rapid heartrate      Medication List    TAKE these medications   acetaminophen 325 MG tablet Commonly known as: TYLENOL Take 2 tablets (650 mg total) by mouth every 6 (six) hours as needed for  mild pain (or Fever >/= 101).   albuterol 108 (90 Base) MCG/ACT inhaler Commonly known as: VENTOLIN HFA INHALE 2 PUFFS INTO THE LUNGS EVERY 6 HOURS AS NEEDED FOR WHEEZING OR SHORTNESS OF BREATH What changed:   how much to take  how to take this  when to take this  reasons to take this  additional instructions   amLODipine 10 MG tablet Commonly known as: NORVASC Take 1 tablet (10 mg total) by mouth daily.   budesonide-formoterol 160-4.5 MCG/ACT inhaler Commonly known as: SYMBICORT Inhale 2 puffs into the lungs 2 (two) times daily.   dextromethorphan-guaiFENesin 30-600 MG 12hr tablet Commonly known as: MUCINEX DM Take 1 tablet by  mouth 2 (two) times daily as needed for cough.   furosemide 40 MG tablet Commonly known as: LASIX Take 1 tablet by mouth daily.   lisinopril 40 MG tablet Commonly known as: ZESTRIL Take 1 tablet (40 mg total) by mouth daily.   predniSONE 10 MG tablet Commonly known as: DELTASONE Take 6 tablets (60 mg total) by mouth daily with breakfast. Take 6 tabs tomorrow and then decrease by 1 tab daily until finished   rosuvastatin 10 MG tablet Commonly known as: Crestor Take 1 tablet (10 mg total) by mouth daily.   Spiriva Respimat 2.5 MCG/ACT Aers Generic drug: Tiotropium Bromide Monohydrate Inhale 1 puff into the lungs daily.            Durable Medical Equipment  (From admission, onward)         Start     Ordered   12/22/18 1424  For home use only DME oxygen  Once    Question Answer Comment  Length of Need 6 Months   Mode or (Route) Nasal cannula   Liters per Minute 2   Oxygen conserving device Yes   Oxygen delivery system Gas      12/22/18 1423          Allergies  Allergen Reactions  . Stiolto Respimat [Tiotropium Bromide-Olodaterol] Other (See Comments)    Severe headaches and rapid heartrate     Procedures/Studies:   Dg Chest 2 View  Result Date: 12/07/2018 CLINICAL DATA:  Shortness of breath, left flank pain EXAM: CHEST - 2 VIEW COMPARISON:  09/23/2018 FINDINGS: Multiple rounded nodules are again noted in the upper lobes bilaterally, stable since prior study and prior CT. No confluent airspace opacities. Heart is normal size. No effusions. No acute bony abnormality. IMPRESSION: No active cardiopulmonary disease. Electronically Signed   By: Rolm Baptise M.D.   On: 12/07/2018 09:37   Dg Chest Port 1 View  Result Date: 12/20/2018 CLINICAL DATA:  Shortness of breath. Hypoxemia. EXAM: PORTABLE CHEST 1 VIEW COMPARISON:  Radiograph 12/07/2018. Chest CT 09/23/2018 FINDINGS: The cardiomediastinal contours are unchanged. Heart is normal in size. Right upper lobe  pulmonary nodules which are stable radiographically. Additional pulmonary nodules on CT are not well visualized. Pulmonary vasculature is normal. No consolidation, pleural effusion, or pneumothorax. No acute osseous abnormalities are seen. IMPRESSION: No acute findings. Pulmonary nodules in the right upper lung which are stable radiographically from prior imaging. Electronically Signed   By: Keith Rake M.D.   On: 12/20/2018 01:09     The results of significant diagnostics from this hospitalization (including imaging, microbiology, ancillary and laboratory) are listed below for reference.     Microbiology: Recent Results (from the past 240 hour(s))  SARS Coronavirus 2 [facility] order, Performed in [facility] hospital lab) Nasopharyngeal Nasopharyngeal Swab     Status:  None   Collection Time: 12/20/18 12:40 AM   Specimen: Nasopharyngeal Swab  Result Value Ref Range Status   SARS Coronavirus 2 NEGATIVE NEGATIVE Final    Comment: (NOTE) If result is NEGATIVE SARS-CoV-2 target nucleic acids are NOT DETECTED. The SARS-CoV-2 RNA is generally detectable in upper and lower  respiratory specimens during the acute phase of infection. The lowest  concentration of SARS-CoV-2 viral copies this assay can detect is 250  copies / mL. A negative result does not preclude SARS-CoV-2 infection  and should not be used as the sole basis for treatment or other  patient management decisions.  A negative result may occur with  improper specimen collection / handling, submission of specimen other  than nasopharyngeal swab, presence of viral mutation(s) within the  areas targeted by this assay, and inadequate number of viral copies  (<250 copies / mL). A negative result must be combined with clinical  observations, patient history, and epidemiological information. If result is POSITIVE SARS-CoV-2 target nucleic acids are DETECTED. The SARS-CoV-2 RNA is generally detectable in upper and lower  respiratory  specimens dur ing the acute phase of infection.  Positive  results are indicative of active infection with SARS-CoV-2.  Clinical  correlation with patient history and other diagnostic information is  necessary to determine patient infection status.  Positive results do  not rule out bacterial infection or co-infection with other viruses. If result is PRESUMPTIVE POSTIVE SARS-CoV-2 nucleic acids MAY BE PRESENT.   A presumptive positive result was obtained on the submitted specimen  and confirmed on repeat testing.  While 2019 novel coronavirus  (SARS-CoV-2) nucleic acids may be present in the submitted sample  additional confirmatory testing may be necessary for epidemiological  and / or clinical management purposes  to differentiate between  SARS-CoV-2 and other Sarbecovirus currently known to infect humans.  If clinically indicated additional testing with an alternate test  methodology [phone]) is advised. The SARS-CoV-2 RNA is generally  detectable in upper and lower respiratory sp ecimens during the acute  phase of infection. The expected result is Negative. Fact Sheet for Patients:  BoilerBrush.com.cy Fact Sheet for Healthcare Providers: https://pope.com/ This test is not yet approved or cleared by the [place] FDA and has been authorized for detection and/or diagnosis of SARS-CoV-2 by FDA under an Emergency Use Authorization (EUA).  This EUA will remain in effect (meaning this test can be used) for the duration of the COVID-19 declaration under Section 564(b)(1) of the Act, 21 U.S.C. section 360bbb-3(b)(1), unless the authorization is terminated or revoked sooner. Performed at [facility], [address], [place], [place] [address]      Labs: BNP (last 3 results) Recent Labs    09/14/18 1220 09/23/18 1156  BNP 7.2 22.5   Basic Metabolic Panel: Recent Labs  Lab 12/20/18 0054 12/20/18 0412 12/22/18 0437   NA 138 138 138  K 3.7 3.5 4.3  CL 96* 96* 99  CO2 30 29 30   GLUCOSE 144* 136* 123*  BUN 17 20 29*  CREATININE 1.23* 1.32* 1.12*  CALCIUM 9.6 9.6 9.5   Liver Function Tests: Recent Labs  Lab 12/20/18 0412  AST 20  ALT 13  ALKPHOS 65  BILITOT 0.5  PROT 8.6*  ALBUMIN 4.3   No results for input(s): LIPASE, AMYLASE in the last 168 hours. No results for input(s): AMMONIA in the last 168 hours. CBC: Recent Labs  Lab 12/20/18 0054 12/20/18 0412 12/22/18 0437  WBC 13.9* 11.3* 8.4  NEUTROABS  8.2* 10.8*  --   HGB 13.3 12.7 12.2  HCT 40.4 38.6 36.8  MCV 86.9 87.7 87.6  PLT 298 264 255   Cardiac Enzymes: No results for input(s): CKTOTAL, CKMB, CKMBINDEX, TROPONINI in the last 168 hours. BNP: Invalid input(s): POCBNP CBG: No results for input(s): GLUCAP in the last 168 hours. D-Dimer No results for input(s): DDIMER in the last 72 hours. Hgb A1c Recent Labs    12/22/18 0437  HGBA1C 5.5   Lipid Profile No results for input(s): CHOL, HDL, LDLCALC, TRIG, CHOLHDL, LDLDIRECT in the last 72 hours. Thyroid function studies No results for input(s): TSH, T4TOTAL, T3FREE, THYROIDAB in the last 72 hours.  Invalid input(s): FREET3 Anemia work up No results for input(s): VITAMINB12, FOLATE, FERRITIN, TIBC, IRON, RETICCTPCT in the last 72 hours. Urinalysis    Component Value Date/Time   COLORURINE YELLOW 11/12/2008 1440   APPEARANCEUR CLEAR 11/12/2008 1440   LABSPEC 1.016 11/12/2008 1440   PHURINE 7.5 11/12/2008 1440   GLUCOSEU NEGATIVE 11/12/2008 1440   HGBUR TRACE (A) 11/12/2008 1440   BILIRUBINUR NEGATIVE 11/12/2008 1440   KETONESUR NEGATIVE 11/12/2008 1440   PROTEINUR NEGATIVE 11/12/2008 1440   UROBILINOGEN 1.0 11/12/2008 1440   NITRITE NEGATIVE 11/12/2008 1440   LEUKOCYTESUR SMALL (A) 11/12/2008 1440   Sepsis Labs Invalid input(s): PROCALCITONIN,  WBC,  LACTICIDVEN Microbiology Recent Results (from the past 240 hour(s))  SARS Coronavirus 2 [facility](Hospital order,  [facility]formed [facility]) Nasopharyngeal Nasopharyngeal Swab     Status: None   Collection Time: 12/20/18 12:40 AM   Specimen: Nasopharyngeal Swab  Result Value Ref Range Status   SARS Coronavirus 2 NEGATIVE NEGATIVE Final    Comment: (NOTE) If result is NEGATIVE SARS-CoV-2 target nucleic acids are NOT DETECTED. The SARS-CoV-2 RNA is generally detectable in upper and lower  respiratory specimens during the acute phase of infection. The lowest  concentration of SARS-CoV-2 viral copies this assay can detect is 250  copies / mL. A negative result does not preclude SARS-CoV-2 infection  and should not be used as the sole basis for treatment or other  patient management decisions.  A negative result may occur with  improper specimen collection / handling, submission of specimen other  than nasopharyngeal swab, presence of viral mutation(s) within the  areas targeted by this assay, and inadequate number of viral copies  (<250 copies / mL). A negative result must be combined with clinical  observations, patient history, and epidemiological information. If result is POSITIVE SARS-CoV-2 target nucleic acids are DETECTED. The SARS-CoV-2 RNA is generally detectable in upper and lower  respiratory specimens dur ing the acute phase of infection.  Positive  results are indicative of active infection with SARS-CoV-2.  Clinical  correlation with patient history and other diagnostic information is  necessary to determine patient infection status.  Positive results do  not rule out bacterial infection or co-infection with other viruses. If result is PRESUMPTIVE POSTIVE SARS-CoV-2 nucleic acids MAY BE PRESENT.   A presumptive positive result was obtained on the submitted specimen  and confirmed on repeat testing.  While 2019 novel coronavirus  (SARS-CoV-2) nucleic acids may be present in the submitted sample  additional confirmatory testing may be necessary for epidemiological  and / or  clinical management purposes  to differentiate between  SARS-CoV-2 and other Sarbecovirus currently known to infect humans.  If clinically indicated additional testing with an alternate test  methodology [phone] is advised. The SARS-CoV-2 RNA is generally  detectable in upper and lower  respiratory sp ecimens during the acute  phase of infection. The expected result is Negative. Fact Sheet for Patients:  BoilerBrush.com.cy Fact Sheet for Healthcare Providers: https://pope.com/ This test is not yet approved or cleared by the [place] FDA and has been authorized for detection and/or diagnosis of SARS-CoV-2 by FDA under an Emergency Use Authorization (EUA).  This EUA will remain in effect (meaning this test can be used) for the duration of the COVID-19 declaration under Section 564(b)(1) of the Act, 21 U.S.C. section 360bbb-3(b)(1), unless the authorization is terminated or revoked sooner. Performed at [facility], [address] [place], [place] [address]      Time coordinating discharge in minutes: 65  SIGNED:   [name], MD  [facility] 12/22/2018, 2:32 PM Pager   If 7PM-7AM, please contact night-coverage [email or web address] Password [email or web address]

## 2018-12-22 NOTE — Progress Notes (Signed)
Kathleen Cameron with [employer] to deliver oxygen needs to room prior to patient's discharge. Stats note in per RN, no further needs identified. Patient and husband at bedside aware of delivery of O2 before they will be discharged.   [place], [place]

## 2018-12-22 NOTE — Progress Notes (Signed)
SATURATION QUALIFICATIONS: (This note is used to comply with regulatory documentation for home oxygen)  Patient Saturations on Room Air at Rest = 93%  Patient Saturations on Room Air while Ambulating = 84%  Patient Saturations on 2 Liters of oxygen while Ambulating = 96%  Please briefly explain why patient needs home oxygen: acute dropped on room air after walking the hall halfway back to room

## 2019-01-05 ENCOUNTER — Encounter: Payer: Self-pay | Admitting: Emergency Medicine

## 2019-01-05 ENCOUNTER — Ambulatory Visit (INDEPENDENT_AMBULATORY_CARE_PROVIDER_SITE_OTHER): Payer: Commercial Managed Care - PPO | Admitting: Emergency Medicine

## 2019-01-05 ENCOUNTER — Other Ambulatory Visit: Payer: Self-pay

## 2019-01-05 ENCOUNTER — Telehealth: Payer: Self-pay | Admitting: Emergency Medicine

## 2019-01-05 VITALS — BP 140/82 | HR 96 | Temp 98.0°F | Resp 16 | Ht 61.0 in | Wt 111.8 lb

## 2019-01-05 DIAGNOSIS — E785 Hyperlipidemia, unspecified: Secondary | ICD-10-CM

## 2019-01-05 DIAGNOSIS — N183 Chronic kidney disease, stage 3 unspecified: Secondary | ICD-10-CM

## 2019-01-05 DIAGNOSIS — Z23 Encounter for immunization: Secondary | ICD-10-CM

## 2019-01-05 DIAGNOSIS — I10 Essential (primary) hypertension: Secondary | ICD-10-CM

## 2019-01-05 DIAGNOSIS — R918 Other nonspecific abnormal finding of lung field: Secondary | ICD-10-CM | POA: Diagnosis not present

## 2019-01-05 DIAGNOSIS — J439 Emphysema, unspecified: Secondary | ICD-10-CM

## 2019-01-05 DIAGNOSIS — N189 Chronic kidney disease, unspecified: Secondary | ICD-10-CM | POA: Insufficient documentation

## 2019-01-05 DIAGNOSIS — Z09 Encounter for follow-up examination after completed treatment for conditions other than malignant neoplasm: Secondary | ICD-10-CM

## 2019-01-05 MED ORDER — BUDESONIDE-FORMOTEROL FUMARATE 160-4.5 MCG/ACT IN AERO: 2.0000 | INHALATION_SPRAY | Freq: Two times a day (BID) | RESPIRATORY_TRACT | 0 refills | Status: DC

## 2019-01-05 NOTE — Progress Notes (Signed)
Physician Discharge Summary  Kathleen Cameron ZOX:[id number] DOB: [birth year] DOA: 12/20/2018  PCP: [name], MD  Admit date: 12/20/2018 Discharge date: 12/22/2018  Admitted From: home Disposition:  home   Recommendations for Outpatient Follow-up:  1. Wean O2 as able.  Home Health: none Equipment/Devices:  none    Discharge Condition:  stable   CODE STATUS:  Full code   Diet recommendation:  Heart healthy Consultations:  none    Discharge Diagnoses:  Principal Problem:   COPD exacerbation (HCC) Active Problems:   Acute respiratory failure with hypoxia (HCC)   Uncontrolled hypertension   Dyslipidemia   Pulmonary nodules   Brief Summary: Kathleen B Milbourneis an 53 y.o.femalewithhistory of COPD, hypertension hyperlipidemia presents to the ER because of worsening shortness of breath. Patient states over the last 2 days patient has been worsening shortness of breath even at rest. With wheezing some cough denies any fever chills or any sick contacts. Has some chest tightness with increasing wheezing. EMS was called and patient was brought to the ER. On the route patient was given IV Solu-Medrol and nebulizer treatment.   Hospital Course:  COPD exacerbation-acute respiratory failure with hypoxemia -She has never smoked and has had passive exposure - Patient has a mild cough but no wheezing on exam today -She is ambulated with the nurse who stated that the patient did not have much shortness of breath however her pulse ox did drop to 87% and she required 2 L of oxygen to keep above 90% -I will discharge her with home oxygen-she can continue Breo Ellipta and Spiriva- I will order a rescue inhaler for her as she does not have this- when her Brio Ellipta finishes, she can use the [facility] inhaler that she has been receiving in the hospital  Hypertension - Continue amlodipine and lisinopril and furosemide  CKD stage III   -Stable  Pulmonary  nodules - These need to be followed up as outpatient- Per report they are stable from prior CT  Medication List        TAKE these medications       acetaminophen 325 MG tablet Commonly known as: TYLENOL Take 2 tablets (650 mg total) by mouth every 6 (six) hours as needed for mild pain (or Fever >/= 101).   albuterol 108 (90 Base) MCG/ACT inhaler Commonly known as: VENTOLIN HFA INHALE 2 PUFFS INTO THE LUNGS EVERY 6 HOURS AS NEEDED FOR WHEEZING OR SHORTNESS OF BREATH What changed:   how much to take  how to take this  when to take this  reasons to take this  additional instructions   amLODipine 10 MG tablet Commonly known as: NORVASC Take 1 tablet (10 mg total) by mouth daily.   budesonide-formoterol 160-4.5 MCG/ACT inhaler Commonly known as: SYMBICORT Inhale 2 puffs into the lungs 2 (two) times daily.   dextromethorphan-guaiFENesin 30-600 MG 12hr tablet Commonly known as: MUCINEX DM Take 1 tablet by mouth 2 (two) times daily as needed for cough.   furosemide 40 MG tablet Commonly known as: LASIX Take 1 tablet by mouth daily.   lisinopril 40 MG tablet Commonly known as: ZESTRIL Take 1 tablet (40 mg total) by mouth daily.   predniSONE 10 MG tablet Commonly known as: DELTASONE Take 6 tablets (60 mg total) by mouth daily with breakfast. Take 6 tabs tomorrow and then decrease by 1 tab daily until finished   rosuvastatin 10 MG tablet Commonly known as: Crestor Take 1 tablet (10 mg total) by mouth daily.  Spiriva Respimat 2.5 MCG/ACT Aers Generic drug: Tiotropium Bromide Monohydrate Inhale 1 puff into the lungs daily.          Kathleen Cameron 53 y.o.   Chief Complaint  Patient presents with  . Hypertension    6 MONTH FOLLOW UP    HISTORY OF PRESENT ILLNESS: This is a 53 y.o. female recently in the hospital, here for follow-up. #1 COPD: On Dulera, albuterol as needed, and Spiriva.  Has oxygen at home and uses it sparingly. #2  hypertension: On Norvasc, lisinopril, and Lasix.  No history of CHF.  Recent echo with ejection fraction over 65%. #3 dyslipidemia: On Crestor 10 mg daily. Feeling better.  Has no complaints or medical concerns today.  HPI   Prior to Admission medications   Medication Sig Start Date End Date Taking? Authorizing Provider  acetaminophen (TYLENOL) 325 MG tablet Take 2 tablets (650 mg total) by mouth every 6 (six) hours as needed for mild pain (or Fever >/= 101). 12/22/18  Yes Rizwan, Saima, MD  albuterol (VENTOLIN HFA) 108 (90 Base) MCG/ACT inhaler INHALE 2 PUFFS INTO THE LUNGS EVERY 6 HOURS AS NEEDED FOR WHEEZING OR SHORTNESS OF BREATH 12/22/18  Yes [name], MD  amLODipine (NORVASC) 10 MG tablet Take 1 tablet (10 mg total) by mouth daily. 09/29/18  Yes Armonii Sieh, Eilleen Kempf, MD  budesonide-formoterol [facility]) 160-4.5 MCG/ACT inhaler Inhale 2 puffs into the lungs 2 (two) times daily. 12/07/18  Yes [name], NP  dextromethorphan-guaiFENesin [facility]) 30-600 MG 12hr tablet Take 1 tablet by mouth 2 (two) times daily as needed for cough. 09/29/18  Yes Dorian Renfro, Eilleen Kempf, MD  furosemide (LASIX) 40 MG tablet Take 1 tablet by mouth daily. 11/21/18  Yes [provider]  rosuvastatin (CRESTOR) 10 MG tablet Take 1 tablet (10 mg total) by mouth daily. 09/29/18  Yes Asheton Scheffler, Eilleen Kempf, MD  Tiotropium Bromide Monohydrate (SPIRIVA RESPIMAT) 2.5 MCG/ACT AERS Inhale 1 puff into the lungs daily. 12/07/18  Yes [name], NP  lisinopril (ZESTRIL) 40 MG tablet Take 1 tablet (40 mg total) by mouth daily. 09/29/18 12/28/18  [name], MD  predniSONE (DELTASONE) 10 MG tablet Take 6 tablets (60 mg total) by mouth daily with breakfast. Take 6 tabs tomorrow and then decrease by 1 tab daily until finished Patient not taking: Reported on 01/05/2019 12/22/18   [name], MD    Allergies  Allergen Reactions  . Stiolto Respimat [Tiotropium Bromide-Olodaterol] Other (See Comments)     Severe headaches and rapid heartrate    Patient Active Problem List   Diagnosis Date Noted  . CKD (chronic kidney disease) stage 3, GFR 30-59 ml/min (HCC) 01/05/2019  . Abnormal CT of the chest 10/14/2018  . Pulmonary emphysema (HCC) 09/29/2018  . Pulmonary nodules 09/27/2018  . Dyslipidemia 07/07/2018  . Essential hypertension 03/10/2018    Past Medical History:  Diagnosis Date  . Hypertension     History reviewed. No pertinent surgical history.  Social History   Socioeconomic History  . Marital status: Single    Spouse name: Not on file  . Number of children: Not on file  . Years of education: Not on file  . Highest education level: Not on file  Occupational History  . Not on file  Social Needs  . Financial resource strain: Not on file  . Food insecurity    Worry: Not on file    Inability: Not on file  . Transportation needs    Medical: Not on  file    Non-medical: Not on file  Tobacco Use  . Smoking status: Never Smoker  . Smokeless tobacco: Never Used  Substance and Sexual Activity  . Alcohol use: Yes    Alcohol/week: 2.0 standard drinks    Types: 2 Cans of beer per week  . Drug use: Never  . Sexual activity: Not on file  Lifestyle  . Physical activity    Days per week: Not on file    Minutes per session: Not on file  . Stress: Not on file  Relationships  . Social Musician on phone: Not on file    Gets together: Not on file    Attends religious service: Not on file    Active member of club or organization: Not on file    Attends meetings of clubs or organizations: Not on file    Relationship status: Not on file  . Intimate partner violence    Fear of current or ex partner: Not on file    Emotionally abused: Not on file    Physically abused: Not on file    Forced sexual activity: Not on file  Other Topics Concern  . Not on file  Social History Narrative  . Not on file    Family History  Family history unknown: Yes       Review of Systems  Constitutional: Negative.  Negative for chills and fever.  HENT: Negative.  Negative for congestion and sore throat.   Eyes: Negative.   Respiratory: Positive for shortness of breath (Dyspnea on exertion). Negative for cough and sputum production.   Cardiovascular: Negative.  Negative for chest pain, palpitations and leg swelling.  Gastrointestinal: Negative.  Negative for abdominal pain, diarrhea, nausea and vomiting.  Genitourinary: Negative.  Negative for dysuria.  Skin: Negative.  Negative for rash.  Neurological: Negative.  Negative for dizziness and headaches.  Endo/Heme/Allergies: Negative.    Vitals:   01/05/19 0804  BP: (!) 153/78  Pulse: 96  Resp: 16  Temp: 98 F (36.7 C)  SpO2: 94%     Physical Exam Vitals signs reviewed.  Constitutional:      Appearance: Normal appearance.  HENT:     Head: Normocephalic.  Eyes:     Extraocular Movements: Extraocular movements intact.     Pupils: Pupils are equal, round, and reactive to light.  Neck:     Musculoskeletal: Normal range of motion and neck supple.  Cardiovascular:     Rate and Rhythm: Normal rate and regular rhythm.     Pulses: Normal pulses.     Heart sounds: Normal heart sounds.  Pulmonary:     Effort: Pulmonary effort is normal.     Breath sounds: Normal breath sounds.  Abdominal:     Palpations: Abdomen is soft.     Tenderness: There is no abdominal tenderness.  Musculoskeletal: Normal range of motion.  Skin:    General: Skin is warm and dry.     Capillary Refill: Capillary refill takes less than 2 seconds.  Neurological:     General: No focal deficit present.     Mental Status: She is alert and oriented to person, place, and time.  Psychiatric:        Mood and Affect: Mood normal.        Behavior: Behavior normal.      ASSESSMENT & PLAN: Cathye was seen today for hypertension.  Diagnoses and all orders for this visit:  Pulmonary emphysema, unspecified emphysema type  (HCC)  Dyslipidemia  Essential hypertension  Pulmonary nodules  CKD (chronic kidney disease) stage 3, GFR 30-59 ml/min (HCC)  Need for prophylactic vaccination and inoculation against influenza -     Flu Vaccine QUAD 36+ mos IM  Hospital discharge follow-up   Clinically stable.  No medical concerns identified during this visit. Continue present medications.  No changes. Follow-up in 3 months.   Patient Instructions   Hypertension, Adult High blood pressure (hypertension) is when the force of blood pumping through the arteries is too strong. The arteries are the blood vessels that carry blood from the heart throughout the body. Hypertension forces the heart to work harder to pump blood and may cause arteries to become narrow or stiff. Untreated or uncontrolled hypertension can cause a heart attack, heart failure, a stroke, kidney disease, and other problems. A blood pressure reading consists of a higher number over a lower number. Ideally, your blood pressure should be below 120/80. The first ("top") number is called the systolic pressure. It is a measure of the pressure in your arteries as your heart beats. The second ("bottom") number is called the diastolic pressure. It is a measure of the pressure in your arteries as the heart relaxes. What are the causes? The exact cause of this condition is not known. There are some conditions that result in or are related to high blood pressure. What increases the risk? Some risk factors for high blood pressure are under your control. The following factors may make you more likely to develop this condition:  Smoking.  Having type 2 diabetes mellitus, high cholesterol, or both.  Not getting enough exercise or physical activity.  Being overweight.  Having too much fat, sugar, calories, or salt (sodium) in your diet.  Drinking too much alcohol. Some risk factors for high blood pressure may be difficult or impossible to change. Some of  these factors include:  Having chronic kidney disease.  Having a family history of high blood pressure.  Age. Risk increases with age.  Race. You may be at higher risk if you are African American.  Gender. Men are at higher risk than women before age 55. After age 55, women are at higher risk than men.  Having obstructive sleep apnea.  Stress. What are the signs or symptoms? High blood pressure may not cause symptoms. Very high blood pressure (hypertensive crisis) may cause:  Headache.  Anxiety.  Shortness of breath.  Nosebleed.  Nausea and vomiting.  Vision changes.  Severe chest pain.  Seizures. How is this diagnosed? This condition is diagnosed by measuring your blood pressure while you are seated, with your arm resting on a flat surface, your legs uncrossed, and your feet flat on the floor. The cuff of the blood pressure monitor will be placed directly against the skin of your upper arm at the level of your heart. It should be measured at least twice using the same arm. Certain conditions can cause a difference in blood pressure between your right and left arms. Certain factors can cause blood pressure readings to be lower or higher than normal for a short period of time:  When your blood pressure is higher when you are in a health care provider's office than when you are at home, this is called white coat hypertension. Most people with this condition do not need medicines.  When your blood pressure is higher at home than when you are in a health care provider's office, this is called masked hypertension. Most people with this condition  may need medicines to control blood pressure. If you have a high blood pressure reading during one visit or you have normal blood pressure with other risk factors, you may be asked to:  Return on a different day to have your blood pressure checked again.  Monitor your blood pressure at home for 1 week or longer. If you are diagnosed  with hypertension, you may have other blood or imaging tests to help your health care provider understand your overall risk for other conditions. How is this treated? This condition is treated by making healthy lifestyle changes, such as eating healthy foods, exercising more, and reducing your alcohol intake. Your health care provider may prescribe medicine if lifestyle changes are not enough to get your blood pressure under control, and if:  Your systolic blood pressure is above 130.  Your diastolic blood pressure is above 80. Your personal target blood pressure may vary depending on your medical conditions, your age, and other factors. Follow these instructions at home: Eating and drinking   Eat a diet that is high in fiber and potassium, and low in sodium, added sugar, and fat. An example eating plan is called the DASH (Dietary Approaches to Stop Hypertension) diet. To eat this way: ? Eat plenty of fresh fruits and vegetables. Try to fill one half of your plate at each meal with fruits and vegetables. ? Eat whole grains, such as whole-wheat pasta, brown rice, or whole-grain bread. Fill about one fourth of your plate with whole grains. ? Eat or drink low-fat dairy products, such as skim milk or low-fat yogurt. ? Avoid fatty cuts of meat, processed or cured meats, and poultry with skin. Fill about one fourth of your plate with lean proteins, such as fish, chicken without skin, beans, eggs, or tofu. ? Avoid pre-made and processed foods. These tend to be higher in sodium, added sugar, and fat.  Reduce your daily sodium intake. Most people with hypertension should eat less than 1,500 mg of sodium a day.  Do not drink alcohol if: ? Your health care provider tells you not to drink. ? You are pregnant, may be pregnant, or are planning to become pregnant.  If you drink alcohol: ? Limit how much you use to:  0-1 drink a day for women.  0-2 drinks a day for men. ? Be aware of how much alcohol  is in your drink. In the [place] one drink equals one 12 oz bottle of beer (355 mL), one 5 oz glass of wine (148 mL), or one 1 oz glass of hard liquor (44 mL). Lifestyle   Work with your health care provider to maintain a healthy body weight or to lose weight. Ask what an ideal weight is for you.  Get at least 30 minutes of exercise most days of the week. Activities may include walking, swimming, or biking.  Include exercise to strengthen your muscles (resistance exercise), such as Pilates or lifting weights, as part of your weekly exercise routine. Try to do these types of exercises for 30 minutes at least 3 days a week.  Do not use any products that contain nicotine or tobacco, such as cigarettes, e-cigarettes, and chewing tobacco. If you need help quitting, ask your health care provider.  Monitor your blood pressure at home as told by your health care provider.  Keep all follow-up visits as told by your health care provider. This is important. Medicines  Take over-the-counter and prescription medicines only as told by your health care provider. Follow  directions carefully. Blood pressure medicines must be taken as prescribed.  Do not skip doses of blood pressure medicine. Doing this puts you at risk for problems and can make the medicine less effective.  Ask your health care provider about side effects or reactions to medicines that you should watch for. Contact a health care provider if you:  Think you are having a reaction to a medicine you are taking.  Have headaches that keep coming back (recurring).  Feel dizzy.  Have swelling in your ankles.  Have trouble with your vision. Get help right away if you:  Develop a severe headache or confusion.  Have unusual weakness or numbness.  Feel faint.  Have severe pain in your chest or abdomen.  Vomit repeatedly.  Have trouble breathing. Summary  Hypertension is when the force of blood pumping through your arteries is too  strong. If this condition is not controlled, it may put you at risk for serious complications.  Your personal target blood pressure may vary depending on your medical conditions, your age, and other factors. For most people, a normal blood pressure is less than 120/80.  Hypertension is treated with lifestyle changes, medicines, or a combination of both. Lifestyle changes include losing weight, eating a healthy, low-sodium diet, exercising more, and limiting alcohol. This information is not intended to replace advice given to you by your health care provider. Make sure you discuss any questions you have with your health care provider. Document Released: 04/07/2005 Document Revised: 12/16/2017 Document Reviewed: 12/16/2017 Elsevier Patient Education  2020 [employer]. Chronic Obstructive Pulmonary Disease Chronic obstructive pulmonary disease (COPD) is a long-term (chronic) lung problem. When you have COPD, it is hard for air to get in and out of your lungs. Usually the condition gets worse over time, and your lungs will never return to normal. There are things you can do to keep yourself as healthy as possible.  Your doctor may treat your condition with: ? Medicines. ? Oxygen. ? Lung surgery.  Your doctor may also recommend: ? Rehabilitation. This includes steps to make your body work better. It may involve a team of specialists. ? Quitting smoking, if you smoke. ? Exercise and changes to your diet. ? Comfort measures (palliative care). Follow these instructions at home: Medicines  Take over-the-counter and prescription medicines only as told by your doctor.  Talk to your doctor before taking any cough or allergy medicines. You may need to avoid medicines that cause your lungs to be dry. Lifestyle  If you smoke, stop. Smoking makes the problem worse. If you need help quitting, ask your doctor.  Avoid being around things that make your breathing worse. This may include smoke,  chemicals, and fumes.  Stay active, but remember to rest as well.  Learn and use tips on how to relax.  Make sure you get enough sleep. Most adults need at least 7 hours of sleep every night.  Eat healthy foods. Eat smaller meals more often. Rest before meals. Controlled breathing Learn and use tips on how to control your breathing as told by your doctor. Try:  Breathing in (inhaling) through your nose for 1 second. Then, pucker your lips and breath out (exhale) through your lips for 2 seconds.  Putting one hand on your belly (abdomen). Breathe in slowly through your nose for 1 second. Your hand on your belly should move out. Pucker your lips and breathe out slowly through your lips. Your hand on your belly should move in as you breathe  out.  Controlled coughing Learn and use controlled coughing to clear mucus from your lungs. Follow these steps: 1. Lean your head a little forward. 2. Breathe in deeply. 3. Try to hold your breath for 3 seconds. 4. Keep your mouth slightly open while coughing 2 times. 5. Spit any mucus out into a tissue. 6. Rest and do the steps again 1 or 2 times as needed. General instructions  Make sure you get all the shots (vaccines) that your doctor recommends. Ask your doctor about a flu shot and a pneumonia shot.  Use oxygen therapy and pulmonary rehabilitation if told by your doctor. If you need home oxygen therapy, ask your doctor if you should buy a tool to measure your oxygen level (oximeter).  Make a COPD action plan with your doctor. This helps you to know what to do if you feel worse than usual.  Manage any other conditions you have as told by your doctor.  Avoid going outside when it is very hot, cold, or humid.  Avoid people who have a sickness you can catch (contagious).  Keep all follow-up visits as told by your doctor. This is important. Contact a doctor if:  You cough up more mucus than usual.  There is a change in the color or thickness  of the mucus.  It is harder to breathe than usual.  Your breathing is faster than usual.  You have trouble sleeping.  You need to use your medicines more often than usual.  You have trouble doing your normal activities such as getting dressed or walking around the house. Get help right away if:  You have shortness of breath while resting.  You have shortness of breath that stops you from: ? Being able to talk. ? Doing normal activities.  Your chest hurts for longer than 5 minutes.  Your skin color is more blue than usual.  Your pulse oximeter shows that you have low oxygen for longer than 5 minutes.  You have a fever.  You feel too tired to breathe normally. Summary  Chronic obstructive pulmonary disease (COPD) is a long-term lung problem.  The way your lungs work will never return to normal. Usually the condition gets worse over time. There are things you can do to keep yourself as healthy as possible.  Take over-the-counter and prescription medicines only as told by your doctor.  If you smoke, stop. Smoking makes the problem worse. This information is not intended to replace advice given to you by your health care provider. Make sure you discuss any questions you have with your health care provider. Document Released: 09/24/2007 Document Revised: 03/20/2017 Document Reviewed: 05/12/2016 Elsevier Patient Education  [employer].     If you have lab work done today you will be contacted with your lab results within the next 2 weeks.  If you have not heard from [place] then please contact us. The fastest way to get your results is to register for My Chart.   IF you received an x-ray today, you will receive an invoice from [facility]. Please contact [facility] at [phone] with questions or concerns regarding your invoice.   IF you received labwork today, you will receive an invoice from [place]. Please contact [facility] at [phone] with  questions or concerns regarding your invoice.   Our billing staff will not be able to assist you with questions regarding bills from these companies.  You will be contacted with the lab results as soon as they are available.  The fastest way to get your results is to activate your My Chart account. Instructions are located on the last page of this paperwork. If you have not heard from [place] regarding the results in 2 weeks, please contact this office.         Edwina Barth, MD [facility]

## 2019-01-05 NOTE — Patient Instructions (Addendum)
Hypertension, Adult High blood pressure (hypertension) is when the force of blood pumping through the arteries is too strong. The arteries are the blood vessels that carry blood from the heart throughout the body. Hypertension forces the heart to work harder to pump blood and may cause arteries to become narrow or stiff. Untreated or uncontrolled hypertension can cause a heart attack, heart failure, a stroke, kidney disease, and other problems. A blood pressure reading consists of a higher number over a lower number. Ideally, your blood pressure should be below 120/80. The first ("top") number is called the systolic pressure. It is a measure of the pressure in your arteries as your heart beats. The second ("bottom") number is called the diastolic pressure. It is a measure of the pressure in your arteries as the heart relaxes. What are the causes? The exact cause of this condition is not known. There are some conditions that result in or are related to high blood pressure. What increases the risk? Some risk factors for high blood pressure are under your control. The following factors may make you more likely to develop this condition:  Smoking.  Having type 2 diabetes mellitus, high cholesterol, or both.  Not getting enough exercise or physical activity.  Being overweight.  Having too much fat, sugar, calories, or salt (sodium) in your diet.  Drinking too much alcohol. Some risk factors for high blood pressure may be difficult or impossible to change. Some of these factors include:  Having chronic kidney disease.  Having a family history of high blood pressure.  Age. Risk increases with age.  Race. You may be at higher risk if you are African American.  Gender. Men are at higher risk than women before age 45. After age 65, women are at higher risk than men.  Having obstructive sleep apnea.  Stress. What are the signs or symptoms? High blood pressure may not cause symptoms. Very high  blood pressure (hypertensive crisis) may cause:  Headache.  Anxiety.  Shortness of breath.  Nosebleed.  Nausea and vomiting.  Vision changes.  Severe chest pain.  Seizures. How is this diagnosed? This condition is diagnosed by measuring your blood pressure while you are seated, with your arm resting on a flat surface, your legs uncrossed, and your feet flat on the floor. The cuff of the blood pressure monitor will be placed directly against the skin of your upper arm at the level of your heart. It should be measured at least twice using the same arm. Certain conditions can cause a difference in blood pressure between your right and left arms. Certain factors can cause blood pressure readings to be lower or higher than normal for a short period of time:  When your blood pressure is higher when you are in a health care provider's office than when you are at home, this is called white coat hypertension. Most people with this condition do not need medicines.  When your blood pressure is higher at home than when you are in a health care provider's office, this is called masked hypertension. Most people with this condition may need medicines to control blood pressure. If you have a high blood pressure reading during one visit or you have normal blood pressure with other risk factors, you may be asked to:  Return on a different day to have your blood pressure checked again.  Monitor your blood pressure at home for 1 week or longer. If you are diagnosed with hypertension, you may have other blood or   imaging tests to help your health care provider understand your overall risk for other conditions. How is this treated? This condition is treated by making healthy lifestyle changes, such as eating healthy foods, exercising more, and reducing your alcohol intake. Your health care provider may prescribe medicine if lifestyle changes are not enough to get your blood pressure under control, and  if:  Your systolic blood pressure is above 130.  Your diastolic blood pressure is above 80. Your personal target blood pressure may vary depending on your medical conditions, your age, and other factors. Follow these instructions at home: Eating and drinking   Eat a diet that is high in fiber and potassium, and low in sodium, added sugar, and fat. An example eating plan is called the DASH (Dietary Approaches to Stop Hypertension) diet. To eat this way: ? Eat plenty of fresh fruits and vegetables. Try to fill one half of your plate at each meal with fruits and vegetables. ? Eat whole grains, such as whole-wheat pasta, brown rice, or whole-grain bread. Fill about one fourth of your plate with whole grains. ? Eat or drink low-fat dairy products, such as skim milk or low-fat yogurt. ? Avoid fatty cuts of meat, processed or cured meats, and poultry with skin. Fill about one fourth of your plate with lean proteins, such as fish, chicken without skin, beans, eggs, or tofu. ? Avoid pre-made and processed foods. These tend to be higher in sodium, added sugar, and fat.  Reduce your daily sodium intake. Most people with hypertension should eat less than 1,500 mg of sodium a day.  Do not drink alcohol if: ? Your health care provider tells you not to drink. ? You are pregnant, may be pregnant, or are planning to become pregnant.  If you drink alcohol: ? Limit how much you use to:  0-1 drink a day for women.  0-2 drinks a day for men. ? Be aware of how much alcohol is in your drink. In the [place] one drink equals one 12 oz bottle of beer (355 mL), one 5 oz glass of wine (148 mL), or one 1 oz glass of hard liquor (44 mL). Lifestyle   Work with your health care provider to maintain a healthy body weight or to lose weight. Ask what an ideal weight is for you.  Get at least 30 minutes of exercise most days of the week. Activities may include walking, swimming, or biking.  Include exercise to  strengthen your muscles (resistance exercise), such as Pilates or lifting weights, as part of your weekly exercise routine. Try to do these types of exercises for 30 minutes at least 3 days a week.  Do not use any products that contain nicotine or tobacco, such as cigarettes, e-cigarettes, and chewing tobacco. If you need help quitting, ask your health care provider.  Monitor your blood pressure at home as told by your health care provider.  Keep all follow-up visits as told by your health care provider. This is important. Medicines  Take over-the-counter and prescription medicines only as told by your health care provider. Follow directions carefully. Blood pressure medicines must be taken as prescribed.  Do not skip doses of blood pressure medicine. Doing this puts you at risk for problems and can make the medicine less effective.  Ask your health care provider about side effects or reactions to medicines that you should watch for. Contact a health care provider if you:  Think you are having a reaction to a medicine you   are taking.  Have headaches that keep coming back (recurring).  Feel dizzy.  Have swelling in your ankles.  Have trouble with your vision. Get help right away if you:  Develop a severe headache or confusion.  Have unusual weakness or numbness.  Feel faint.  Have severe pain in your chest or abdomen.  Vomit repeatedly.  Have trouble breathing. Summary  Hypertension is when the force of blood pumping through your arteries is too strong. If this condition is not controlled, it may put you at risk for serious complications.  Your personal target blood pressure may vary depending on your medical conditions, your age, and other factors. For most people, a normal blood pressure is less than 120/80.  Hypertension is treated with lifestyle changes, medicines, or a combination of both. Lifestyle changes include losing weight, eating a healthy, low-sodium diet,  exercising more, and limiting alcohol. This information is not intended to replace advice given to you by your health care provider. Make sure you discuss any questions you have with your health care provider. Document Released: 04/07/2005 Document Revised: 12/16/2017 Document Reviewed: 12/16/2017 Elsevier Patient Education  2020 [employer] Chronic Obstructive Pulmonary Disease Chronic obstructive pulmonary disease (COPD) is a long-term (chronic) lung problem. When you have COPD, it is hard for air to get in and out of your lungs. Usually the condition gets worse over time, and your lungs will never return to normal. There are things you can do to keep yourself as healthy as possible.  Your doctor may treat your condition with: ? Medicines. ? Oxygen. ? Lung surgery.  Your doctor may also recommend: ? Rehabilitation. This includes steps to make your body work better. It may involve a team of specialists. ? Quitting smoking, if you smoke. ? Exercise and changes to your diet. ? Comfort measures (palliative care). Follow these instructions at home: Medicines  Take over-the-counter and prescription medicines only as told by your doctor.  Talk to your doctor before taking any cough or allergy medicines. You may need to avoid medicines that cause your lungs to be dry. Lifestyle  If you smoke, stop. Smoking makes the problem worse. If you need help quitting, ask your doctor.  Avoid being around things that make your breathing worse. This may include smoke, chemicals, and fumes.  Stay active, but remember to rest as well.  Learn and use tips on how to relax.  Make sure you get enough sleep. Most adults need at least 7 hours of sleep every night.  Eat healthy foods. Eat smaller meals more often. Rest before meals. Controlled breathing Learn and use tips on how to control your breathing as told by your doctor. Try:  Breathing in (inhaling) through your nose for 1 second. Then, pucker  your lips and breath out (exhale) through your lips for 2 seconds.  Putting one hand on your belly (abdomen). Breathe in slowly through your nose for 1 second. Your hand on your belly should move out. Pucker your lips and breathe out slowly through your lips. Your hand on your belly should move in as you breathe out.  Controlled coughing Learn and use controlled coughing to clear mucus from your lungs. Follow these steps: 1. Lean your head a little forward. 2. Breathe in deeply. 3. Try to hold your breath for 3 seconds. 4. Keep your mouth slightly open while coughing 2 times. 5. Spit any mucus out into a tissue. 6. Rest and do the steps again 1 or 2 times as needed. General  instructions  Make sure you get all the shots (vaccines) that your doctor recommends. Ask your doctor about a flu shot and a pneumonia shot.  Use oxygen therapy and pulmonary rehabilitation if told by your doctor. If you need home oxygen therapy, ask your doctor if you should buy a tool to measure your oxygen level (oximeter).  Make a COPD action plan with your doctor. This helps you to know what to do if you feel worse than usual.  Manage any other conditions you have as told by your doctor.  Avoid going outside when it is very hot, cold, or humid.  Avoid people who have a sickness you can catch (contagious).  Keep all follow-up visits as told by your doctor. This is important. Contact a doctor if:  You cough up more mucus than usual.  There is a change in the color or thickness of the mucus.  It is harder to breathe than usual.  Your breathing is faster than usual.  You have trouble sleeping.  You need to use your medicines more often than usual.  You have trouble doing your normal activities such as getting dressed or walking around the house. Get help right away if:  You have shortness of breath while resting.  You have shortness of breath that stops you from: ? Being able to talk. ? Doing  normal activities.  Your chest hurts for longer than 5 minutes.  Your skin color is more blue than usual.  Your pulse oximeter shows that you have low oxygen for longer than 5 minutes.  You have a fever.  You feel too tired to breathe normally. Summary  Chronic obstructive pulmonary disease (COPD) is a long-term lung problem.  The way your lungs work will never return to normal. Usually the condition gets worse over time. There are things you can do to keep yourself as healthy as possible.  Take over-the-counter and prescription medicines only as told by your doctor.  If you smoke, stop. Smoking makes the problem worse. This information is not intended to replace advice given to you by your health care provider. Make sure you discuss any questions you have with your health care provider. Document Released: 09/24/2007 Document Revised: 03/20/2017 Document Reviewed: 05/12/2016 Elsevier Patient Education  [employer].     If you have lab work done today you will be contacted with your lab results within the next 2 weeks.  If you have not heard from [place] then please contact us. The fastest way to get your results is to register for My Chart.   IF you received an x-ray today, you will receive an invoice from [facility]. Please contact [facility] at [phone] with questions or concerns regarding your invoice.   IF you received labwork today, you will receive an invoice from [place]. Please contact [facility] at [phone] with questions or concerns regarding your invoice.   Our billing staff will not be able to assist you with questions regarding bills from these companies.  You will be contacted with the lab results as soon as they are available. The fastest way to get your results is to activate your My Chart account. Instructions are located on the last page of this paperwork. If you have not heard from [place] regarding the results in 2 weeks, please contact  this office.

## 2019-01-05 NOTE — Telephone Encounter (Signed)
Call returned to patient, confirmed DOB, requesting samples of symbicort. Made aware a samples has been placed upfront as well as a patient assistance application that she needs to fill out and drop off or mail back in to [place]. Voiced understanding. Nothing further is needed at this time.

## 2019-01-13 ENCOUNTER — Telehealth: Payer: Self-pay | Admitting: Emergency Medicine

## 2019-01-13 NOTE — Telephone Encounter (Signed)
Call returned to [facility], spoke with Kathleen Cameron, she reports that she see's all five pages of the application. She is going to run the documents through and see if we can get her approved. Patient has been approved.   Call made to patient to make aware. Voiced understanding. Nothing further needed at this time.

## 2019-01-19 ENCOUNTER — Telehealth: Payer: Self-pay | Admitting: Emergency Medicine

## 2019-01-19 NOTE — Telephone Encounter (Signed)
Requested medication (s) are due for refill today: yes  Requested medication (s) are on the active medication list: yes  Last refill: 12/18/2018  Future visit scheduled: yes  Notes to clinic: review for refill   Requested Prescriptions  Pending Prescriptions Disp Refills   furosemide (LASIX) 40 MG tablet [Pharmacy Med Name: FUROSEMIDE 40MG  TABLETS] 30 tablet     Sig: TAKE 1 TABLET(40 MG) BY MOUTH DAILY     Cardiovascular:  Diuretics - Loop Failed - 01/19/2019  3:46 AM      Failed - Cr in normal range and within 360 days    Creatinine, Ser  Date Value Ref Range Status  12/22/2018 1.12 (H) 0.44 - 1.00 mg/dL Final         Failed - Last BP in normal range    BP Readings from Last 1 Encounters:  01/05/19 140/82         Passed - K in normal range and within 360 days    Potassium  Date Value Ref Range Status  12/22/2018 4.3 3.5 - 5.1 mmol/L Final         Passed - Ca in normal range and within 360 days    Calcium  Date Value Ref Range Status  12/22/2018 9.5 8.9 - 10.3 mg/dL Final         Passed - Na in normal range and within 360 days    Sodium  Date Value Ref Range Status  12/22/2018 138 135 - 145 mmol/L Final  09/14/2018 135 134 - 144 mmol/L Final         Passed - Valid encounter within last 6 months    Recent Outpatient Visits          2 weeks ago Pulmonary emphysema, unspecified emphysema type ([place])   Primary Care at [facility], Ines Bloomer, MD   3 months ago Dyspnea on exertion   Primary Care at [place], Ines Bloomer, MD   4 months ago Dyspnea on exertion   Primary Care at [facility], Ines Bloomer, MD   5 months ago Cough   Primary Care at [facility], Ines Bloomer, MD   6 months ago Uncontrolled hypertension   Primary Care at [facility], Ines Bloomer, MD      Future Appointments            In 2 months [place], Ines Bloomer, MD Primary Care at [place], [facility]

## 2019-01-24 ENCOUNTER — Other Ambulatory Visit: Payer: Self-pay | Admitting: Emergency Medicine

## 2019-01-24 NOTE — Telephone Encounter (Signed)
Spoke with the pharmacy. Her insurance will only cover a 90 day supply of her Furosemide. A 30 day supply was sent in on 01/19/2019

## 2019-01-24 NOTE — Telephone Encounter (Signed)
Requested medication (s) are due for refill today: yes  Requested medication (s) are on the active medication list: yes  Last refill:  01/19/2019  Future visit scheduled:yes  Notes to clinic:  Requesting 90 day supply   Requested Prescriptions  Pending Prescriptions Disp Refills   furosemide (LASIX) 40 MG tablet [Pharmacy Med Name: FUROSEMIDE 40MG  TABLETS] 90 tablet     Sig: TAKE 1 TABLET(40 MG) BY MOUTH DAILY     Cardiovascular:  Diuretics - Loop Failed - 01/24/2019  9:24 AM      Failed - Cr in normal range and within 360 days    Creatinine, Ser  Date Value Ref Range Status  12/22/2018 1.12 (H) 0.44 - 1.00 mg/dL Final         Failed - Last BP in normal range    BP Readings from Last 1 Encounters:  01/05/19 140/82         Passed - K in normal range and within 360 days    Potassium  Date Value Ref Range Status  12/22/2018 4.3 3.5 - 5.1 mmol/L Final         Passed - Ca in normal range and within 360 days    Calcium  Date Value Ref Range Status  12/22/2018 9.5 8.9 - 10.3 mg/dL Final         Passed - Na in normal range and within 360 days    Sodium  Date Value Ref Range Status  12/22/2018 138 135 - 145 mmol/L Final  09/14/2018 135 134 - 144 mmol/L Final         Passed - Valid encounter within last 6 months    Recent Outpatient Visits          2 weeks ago Pulmonary emphysema, unspecified emphysema type ([place])   Primary Care at [facility], Ines Bloomer, MD   3 months ago Dyspnea on exertion   Primary Care at [place], Ines Bloomer, MD   4 months ago Dyspnea on exertion   Primary Care at [facility], Ines Bloomer, MD   5 months ago Cough   Primary Care at [facility], Ines Bloomer, MD   6 months ago Uncontrolled hypertension   Primary Care at [facility], Ines Bloomer, MD      Future Appointments            In 2 months [place], Ines Bloomer, MD Primary Care at [place], [facility]

## 2019-01-25 ENCOUNTER — Other Ambulatory Visit: Payer: Self-pay | Admitting: *Deleted

## 2019-01-25 MED ORDER — FUROSEMIDE 40 MG PO TABS: ORAL_TABLET | ORAL | 0 refills | Status: DC

## 2019-01-25 NOTE — Telephone Encounter (Signed)
Prescription sent

## 2019-02-21 ENCOUNTER — Other Ambulatory Visit: Payer: Self-pay

## 2019-02-21 ENCOUNTER — Telehealth (INDEPENDENT_AMBULATORY_CARE_PROVIDER_SITE_OTHER): Payer: Commercial Managed Care - PPO | Admitting: Emergency Medicine

## 2019-02-21 ENCOUNTER — Telehealth: Payer: Self-pay | Admitting: Emergency Medicine

## 2019-02-21 ENCOUNTER — Encounter: Payer: Self-pay | Admitting: Emergency Medicine

## 2019-02-21 VITALS — Ht 61.0 in | Wt 115.0 lb

## 2019-02-21 DIAGNOSIS — Z9981 Dependence on supplemental oxygen: Secondary | ICD-10-CM | POA: Diagnosis not present

## 2019-02-21 DIAGNOSIS — I10 Essential (primary) hypertension: Secondary | ICD-10-CM

## 2019-02-21 DIAGNOSIS — E785 Hyperlipidemia, unspecified: Secondary | ICD-10-CM

## 2019-02-21 DIAGNOSIS — N1832 Chronic kidney disease, stage 3b: Secondary | ICD-10-CM

## 2019-02-21 DIAGNOSIS — J439 Emphysema, unspecified: Secondary | ICD-10-CM

## 2019-02-21 NOTE — Progress Notes (Signed)
Called patient to triage for appointment. Patient states she is having shortness of breath 3-4 weeks when walking and a slight cough. Patient states she wants to discuss her oxygen machine.

## 2019-02-21 NOTE — Telephone Encounter (Signed)
Pt called to request cb in regards to sob. Scheduled telemed for pt

## 2019-02-21 NOTE — Progress Notes (Signed)
Telemedicine Encounter- SOAP NOTE Established Patient  This telephone encounter was conducted with the patient's (or proxy's) verbal consent via audio telecommunications: yes/no: Yes Patient was instructed to have this encounter in a suitably private space; and to only have persons present to whom they give permission to participate. In addition, patient identity was confirmed by use of name plus two identifiers (DOB and address).  I discussed the limitations, risks, security and privacy concerns of performing an evaluation and management service by telephone and the availability of in person appointments. I also discussed with the patient that there may be a patient responsible charge related to this service. The patient expressed understanding and agreed to proceed.  I spent a total of TIME; 0 MIN TO 60 MIN: 15 minutes talking with the patient or their proxy.  No chief complaint on file. COPD with questions about oxygen therapy  Subjective   Kathleen Cameron is a 53 y.o. female established patient. Telephone visit today inquiring about frequency of oxygen therapy at home.  Earlier this year she was diagnosed with COPD, mostly emphysema, and started on medications.  Recently started on oxygen 2 L/min but not sure if she should use it 24/7 or not.  She reports dyspnea on minimal exertion.  Going up and down stairs is very limiting.  I also spoke to her husband during this visit.  No other concerns.  Compliant with medications.  HPI   Patient Active Problem List   Diagnosis Date Noted  . CKD (chronic kidney disease) stage 3, GFR 30-59 ml/min 01/05/2019  . Abnormal CT of the chest 10/14/2018  . Pulmonary emphysema ([place]) 09/29/2018  . Pulmonary nodules 09/27/2018  . Dyslipidemia 07/07/2018  . Essential hypertension 03/10/2018    Past Medical History:  Diagnosis Date  . Hypertension     Current Outpatient Medications  Medication Sig Dispense Refill  . acetaminophen (TYLENOL)  325 MG tablet Take 2 tablets (650 mg total) by mouth every 6 (six) hours as needed for mild pain (or Fever >/= 101).    [name] albuterol (VENTOLIN HFA) 108 (90 Base) MCG/ACT inhaler INHALE 2 PUFFS INTO THE LUNGS EVERY 6 HOURS AS NEEDED FOR WHEEZING OR SHORTNESS OF BREATH 6.7 g 0  . amLODipine (NORVASC) 10 MG tablet Take 1 tablet (10 mg total) by mouth daily. 90 tablet 3  . budesonide-formoterol (SYMBICORT) 160-4.5 MCG/ACT inhaler Inhale 2 puffs into the lungs 2 (two) times daily. 1 Inhaler 0  . dextromethorphan-guaiFENesin (MUCINEX DM) 30-600 MG 12hr tablet Take 1 tablet by mouth 2 (two) times daily as needed for cough. 30 tablet 2  . furosemide (LASIX) 40 MG tablet TAKE 1 TABLET(40 MG) BY MOUTH DAILY 90 tablet 0  . rosuvastatin (CRESTOR) 10 MG tablet Take 1 tablet (10 mg total) by mouth daily. 90 tablet 3  . Tiotropium Bromide Monohydrate (SPIRIVA RESPIMAT) 2.5 MCG/ACT AERS Inhale 1 puff into the lungs daily. 1 g 0  . lisinopril (ZESTRIL) 40 MG tablet Take 1 tablet (40 mg total) by mouth daily. 90 tablet 3  . predniSONE (DELTASONE) 10 MG tablet Take 6 tablets (60 mg total) by mouth daily with breakfast. Take 6 tabs tomorrow and then decrease by 1 tab daily until finished (Patient not taking: Reported on 02/21/2019) 21 tablet 0   No current facility-administered medications for this visit.     Allergies  Allergen Reactions  . Stiolto Respimat [Tiotropium Bromide-Olodaterol] Other (See Comments)    Severe headaches and rapid heartrate    Social History  Socioeconomic History  . Marital status: Single    Spouse name: Not on file  . Number of children: Not on file  . Years of education: Not on file  . Highest education level: Not on file  Occupational History  . Not on file  Social Needs  . Financial resource strain: Not on file  . Food insecurity    Worry: Not on file    Inability: Not on file  . Transportation needs    Medical: Not on file    Non-medical: Not on file  Tobacco Use  .  Smoking status: Never Smoker  . Smokeless tobacco: Never Used  Substance and Sexual Activity  . Alcohol use: Yes    Alcohol/week: 2.0 standard drinks    Types: 2 Cans of beer per week  . Drug use: Never  . Sexual activity: Not on file  Lifestyle  . Physical activity    Days per week: Not on file    Minutes per session: Not on file  . Stress: Not on file  Relationships  . Social Musician on phone: Not on file    Gets together: Not on file    Attends religious service: Not on file    Active member of club or organization: Not on file    Attends meetings of clubs or organizations: Not on file    Relationship status: Not on file  . Intimate partner violence    Fear of current or ex partner: Not on file    Emotionally abused: Not on file    Physically abused: Not on file    Forced sexual activity: Not on file  Other Topics Concern  . Not on file  Social History Narrative  . Not on file    Review of Systems  Constitutional: Negative.  Negative for chills and fever.  HENT: Negative for congestion and sore throat.   Respiratory: Positive for shortness of breath (Mostly on exertion). Negative for cough, hemoptysis, sputum production and wheezing.   Cardiovascular: Negative.  Negative for chest pain and palpitations.  Gastrointestinal: Negative for abdominal pain, diarrhea, nausea and vomiting.  Musculoskeletal: Negative for myalgias.  Skin: Negative.  Negative for rash.  Neurological: Negative.  Negative for dizziness and headaches.  All other systems reviewed and are negative.   Objective  Alert and oriented x3 in no apparent respiratory distress while talking to me on the phone. Vitals as reported by the patient: Today's Vitals   02/21/19 1634  Weight: 115 lb (52.2 kg)  Height: 5\' 1"  (1.549 m)    There are no diagnoses linked to this encounter. Diagnoses and all orders for this visit:  Pulmonary emphysema, unspecified emphysema type (HCC)  On home oxygen  therapy  Essential hypertension  Stage 3b chronic kidney disease  Dyslipidemia   Clinically stable.  No red flag signs or symptoms.  Continue present medications.  Advised about oxygen therapy at home. Covid precautions given.  Also advised to consult pulmonary doctor. Contact the office if any problems.  I discussed the assessment and treatment plan with the patient. The patient was provided an opportunity to ask questions and all were answered. The patient agreed with the plan and demonstrated an understanding of the instructions.   The patient was advised to call back or seek an in-person evaluation if the symptoms worsen or if the condition fails to improve as anticipated.  I provided 15 minutes of non-face-to-face time during this encounter.  , [name]  Primary Care at [facility]

## 2019-03-01 ENCOUNTER — Telehealth: Payer: Self-pay | Admitting: *Deleted

## 2019-03-01 NOTE — Telephone Encounter (Signed)
Faxed order for oxygen back to [place], order needs to be faxed to [facility]. Dr Baltazar Apo is her Pulmonologist. Confirmation page 8:32 pm.

## 2019-03-02 ENCOUNTER — Telehealth: Payer: Self-pay | Admitting: Emergency Medicine

## 2019-03-02 NOTE — Telephone Encounter (Signed)
Spoke with the pt  She states she had a missed call from out phone number, but no VM was left  No documentation of a phone call from our office today and pt states nothing needed to I advised to ignore this missed call and nothing further needed

## 2019-03-03 ENCOUNTER — Telehealth: Payer: Self-pay | Admitting: *Deleted

## 2019-03-03 NOTE — Telephone Encounter (Signed)
Fax was sent to incorrect office(Primary Care at [facility]) from [facility]. I faxed it to [facility] advising them the patient sees Dr Lamonte Sakai at [facility]. Also, I faxed it to [facility]. Confirmation page 6:04 pm.

## 2019-03-07 ENCOUNTER — Ambulatory Visit (INDEPENDENT_AMBULATORY_CARE_PROVIDER_SITE_OTHER): Payer: Commercial Managed Care - PPO | Admitting: Primary Care

## 2019-03-07 ENCOUNTER — Telehealth: Payer: Self-pay | Admitting: Emergency Medicine

## 2019-03-07 ENCOUNTER — Encounter: Payer: Self-pay | Admitting: Primary Care

## 2019-03-07 ENCOUNTER — Other Ambulatory Visit: Payer: Self-pay

## 2019-03-07 VITALS — BP 140/98 | HR 85 | Temp 97.8°F | Ht 61.0 in | Wt 115.0 lb

## 2019-03-07 DIAGNOSIS — R0602 Shortness of breath: Secondary | ICD-10-CM

## 2019-03-07 DIAGNOSIS — R9389 Abnormal findings on diagnostic imaging of other specified body structures: Secondary | ICD-10-CM | POA: Diagnosis not present

## 2019-03-07 DIAGNOSIS — R000 Tachycardia, unspecified: Secondary | ICD-10-CM | POA: Diagnosis not present

## 2019-03-07 DIAGNOSIS — J9611 Chronic respiratory failure with hypoxia: Secondary | ICD-10-CM | POA: Insufficient documentation

## 2019-03-07 DIAGNOSIS — R002 Palpitations: Secondary | ICD-10-CM | POA: Diagnosis not present

## 2019-03-07 MED ORDER — BUDESONIDE-FORMOTEROL FUMARATE 80-4.5 MCG/ACT IN AERO: 2.0000 | INHALATION_SPRAY | Freq: Two times a day (BID) | RESPIRATORY_TRACT | 6 refills | Status: DC

## 2019-03-07 MED ORDER — TIOTROPIUM BROMIDE MONOHYDRATE 18 MCG IN CAPS: 18.0000 ug | ORAL_CAPSULE | Freq: Every day | RESPIRATORY_TRACT | 6 refills | Status: DC

## 2019-03-07 MED ORDER — ALBUTEROL SULFATE HFA 108 (90 BASE) MCG/ACT IN AERS: INHALATION_SPRAY | RESPIRATORY_TRACT | 0 refills | Status: DC

## 2019-03-07 NOTE — Assessment & Plan Note (Addendum)
-   Experiencing palpitations/tachycardia and dyspnea with minimal exertion  - Refer to cardiology - Consider holter monitor or cardiopulmonary stress test

## 2019-03-07 NOTE — Assessment & Plan Note (Signed)
-   Discharged with oxygen in August  - Continue 1-2L oxygen on exertion to keep O2 >88-90%  - Check ONO to see if she needs oxygen at night  - She did not qualify for POC today

## 2019-03-07 NOTE — Assessment & Plan Note (Signed)
-   Multiple rounded nodules noted in the upper lobes bilaterally, stable since prior study and prior CT - Plan repeat CT chest in December and may consider endobronchial ultrasound/biopsy at that time

## 2019-03-07 NOTE — Assessment & Plan Note (Signed)
-   Question fixed asthma  - Non-smoker with second hand exposure - Experiences dyspnea on minimal exertion with associate tachycardia  - FEV1 0.56 (29%), ratio 57  - Continue Symbicort 160 two puffs twice daily - Resume Spiriva  - FU in 3 months with Dr. Lamonte Sakai

## 2019-03-07 NOTE — Progress Notes (Signed)
@Patient  ID: Kathleen Cameron, female    DOB: 10/29/1965, 53 y.o.   MRN: [id number]  Chief Complaint  Patient presents with  . Shortness of Breath    on 2L of O2 since hospital discharge    Referring provider: Georgina QuintSagardia, Miguel Jose, *  HPI: [id number] year old female, never smoked (passive exposure). PMH significant for hypertension, dyslipidemia. New to [place] pulmonary. Patient seen in ED on 09/23/18 with shortness of breath. Covid negative. D-dimer elevated. CTA negative for PE, however, did show emphysema and calcified non-enlarged mediastinal and hilar lymph nodules with superimposed upper lobe predominant numerous bilateral subpleural pulmonary nodules.   Previous [facility] pulmonary encounter: 6/8/2020Clent Ridges- Mechel Schutter, NP Patient presents today for ED follow-up shortness of breath. New pulmonary consult. Exertional shortness of breath started 2 months ago. Occasional but rare cough which is worse at night. Get white mucus up. Wheezing with cough. Her weight is up and down.  Appetite is ok.  She has lost 20 lbs without trying. She had been walking a lot prior to dyspnea symptoms starting. She works at [employer], she has been there for over 20 years. 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 Exposed to dusk and chemicals. Her husband smoked. Denies fever, chest pain, hemoptysis, joint pain, snoring, vision or skin changes.   7/27/20[name]- Byrum, MD Question fixed asthma. FEV1 0.56 (29%), RATIO 57. Not much benefit from Symbicort, changed to stiolto. Abnormal CT chest showed calcified mediastinal hilar adenopathy with scattered partially calcified nodule disease. Unclear etiology but suspected to be chronic. Needs repeat in December. May consider endobronchial ultrasound/biopsy at that time. SARS and HIV negative. Alpha 1 normal.   8/18/2020Clent Ridges- Kaidin Boehle, NP Patient presents today for increased dyspnea and upper back pain. Complains of dyspnea x 4 days. She gets winded after walking 10 feet. O2 sat 97% RA. She can not lay on  left side. No cough. She is unable to use stiolto, switched back to symbicort. Notices an improvement with inhaler use. She is out of her rescue inhaler. Not working currently. Less active. Heat makes her symptoms worse. Her weight is stable, she is up 5 lbs. BP at last visit 164/100. Took all her medication this morning.  Denies wheezing or chest pain.  03/07/2019 Patient presents today for follow-up. She was hospitalized end of August for COPD exacerbation. Discharge with oxygen. She is doing well today. Not wearing oxygen today, asking about qualifying for POC. Not currently on Spiriva, states that it was too expensive. She did notice some benefit with LABA inhaler. She reports that it is hard for her to catch her breath at times.She also experiences fatigue and racing heart rate with minimla exertion. She does not experience chest tightness or wheezing. No cough. Afebrile.   Exposure history: Tobacco- Passive exposure, husband smoked  Work- [employer] for 20 years  Pets- None Mold- None   Allergies  Allergen Reactions  . Stiolto Respimat [Tiotropium Bromide-Olodaterol] Other (See Comments)    Severe headaches and rapid heartrate    Immunization History  Administered Date(s) Administered  . Influenza,inj,Quad PF,6+ Mos 01/05/2019    Past Medical History:  Diagnosis Date  . Hypertension     Tobacco History: Social History   Tobacco Use  Smoking Status Never Smoker  Smokeless Tobacco Never Used   Counseling given: Not Answered   Outpatient Medications Prior to Visit  Medication Sig Dispense Refill  . acetaminophen (TYLENOL) 325 MG tablet Take 2 tablets (650 mg total) by mouth every 6 (six) hours as needed for mild  pain (or Fever >/= 101).    [name] amLODipine (NORVASC) 10 MG tablet Take 1 tablet (10 mg total) by mouth daily. 90 tablet 3  . budesonide-formoterol (SYMBICORT) 160-4.5 MCG/ACT inhaler Inhale 2 puffs into the lungs 2 (two) times daily. 1 Inhaler 0  .  dextromethorphan-guaiFENesin (MUCINEX DM) 30-600 MG 12hr tablet Take 1 tablet by mouth 2 (two) times daily as needed for cough. 30 tablet 2  . furosemide (LASIX) 40 MG tablet TAKE 1 TABLET(40 MG) BY MOUTH DAILY 90 tablet 0  . rosuvastatin (CRESTOR) 10 MG tablet Take 1 tablet (10 mg total) by mouth daily. 90 tablet 3  . albuterol (VENTOLIN HFA) 108 (90 Base) MCG/ACT inhaler INHALE 2 PUFFS INTO THE LUNGS EVERY 6 HOURS AS NEEDED FOR WHEEZING OR SHORTNESS OF BREATH 6.7 g 0  . lisinopril (ZESTRIL) 40 MG tablet Take 1 tablet (40 mg total) by mouth daily. 90 tablet 3  . predniSONE (DELTASONE) 10 MG tablet Take 6 tablets (60 mg total) by mouth daily with breakfast. Take 6 tabs tomorrow and then decrease by 1 tab daily until finished (Patient not taking: Reported on 03/07/2019) 21 tablet 0  . Tiotropium Bromide Monohydrate (SPIRIVA RESPIMAT) 2.5 MCG/ACT AERS Inhale 1 puff into the lungs daily. (Patient not taking: Reported on 03/07/2019) 1 g 0   No facility-administered medications prior to visit.    Review of Systems  Review of Systems  Constitutional: Positive for fatigue.  Respiratory: Positive for shortness of breath. Negative for cough and wheezing.   Cardiovascular: Positive for palpitations.   Physical Exam  BP (!) 140/98 (BP Location: Right Arm, Patient Position: Sitting, Cuff Size: Normal)   Pulse 85   Temp 97.8 F (36.6 C)   Ht 5\' 1"  (1.549 m)   Wt 115 lb (52.2 kg)   SpO2 96% Comment: on room air  BMI 21.73 kg/m  Physical Exam Constitutional:      Appearance: She is well-developed and normal weight.  HENT:     Head: Normocephalic and atraumatic.  Cardiovascular:     Rate and Rhythm: Normal rate and regular rhythm.  Pulmonary:     Effort: Pulmonary effort is normal. No respiratory distress.     Breath sounds: Decreased breath sounds present.  Musculoskeletal: Normal range of motion.  Skin:    General: Skin is warm and dry.  Neurological:     General: No focal deficit  present.     Mental Status: She is alert and oriented to person, place, and time.  Psychiatric:        Mood and Affect: Mood normal.        Behavior: Behavior normal.      Lab Results:  CBC    Component Value Date/Time   WBC 8.4 12/22/2018 0437   RBC 4.20 12/22/2018 0437   HGB 12.2 12/22/2018 0437   HGB 13.4 09/14/2018 1220   HCT 36.8 12/22/2018 0437   HCT 39.3 09/14/2018 1220   PLT 255 12/22/2018 0437   PLT 303 09/14/2018 1220   MCV 87.6 12/22/2018 0437   MCV 84 09/14/2018 1220   MCH 29.0 12/22/2018 0437   MCHC 33.2 12/22/2018 0437   RDW 12.3 12/22/2018 0437   RDW 12.9 09/14/2018 1220   LYMPHSABS 0.4 (L) 12/20/2018 0412   LYMPHSABS 1.7 09/14/2018 1220   MONOABS 0.1 12/20/2018 0412   EOSABS 0.0 12/20/2018 0412   EOSABS 0.3 09/14/2018 1220   BASOSABS 0.0 12/20/2018 0412   BASOSABS 0.1 09/14/2018 1220    BMET  Component Value Date/Time   NA 138 12/22/2018 0437   NA 135 09/14/2018 1220   K 4.3 12/22/2018 0437   CL 99 12/22/2018 0437   CO2 30 12/22/2018 0437   GLUCOSE 123 (H) 12/22/2018 0437   BUN 29 (H) 12/22/2018 0437   BUN 21 09/14/2018 1220   CREATININE 1.12 (H) 12/22/2018 0437   CALCIUM 9.5 12/22/2018 0437   GFRNONAA 55 (L) 12/22/2018 0437   GFRAA >60 12/22/2018 0437    BNP    Component Value Date/Time   BNP 22.5 09/23/2018 1156    ProBNP    Component Value Date/Time   PROBNP 379.9 (H) 09/18/2013 0400    Imaging: No results found.   Assessment & Plan:   Pulmonary emphysema ([place]) - Question fixed asthma  - Non-smoker with second hand exposure - Experiences dyspnea on minimal exertion with associate tachycardia  - FEV1 0.56 (29%), ratio 57  - Continue Symbicort 160 two puffs twice daily - Resume Spiriva  - FU in 3 months with Dr. Lamonte Sakai   Palpitations - Experiencing palpitations/tachycardia and dyspnea with minimal exertion  - Refer to cardiology - Consider holter monitor or cardiopulmonary stress test   Abnormal CT of the chest -  Multiple rounded nodules noted in the upper lobes bilaterally, stable since prior study and prior CT - Plan repeat CT chest in December and may consider endobronchial ultrasound/biopsy at that time   Chronic respiratory failure with hypoxia [facility]) - Discharged with oxygen in August  - Continue 1-2L oxygen on exertion to keep O2 >88-90%  - Check ONO to see if she needs oxygen at night  - She did not qualify for POC today   Martyn Ehrich, NP 03/07/2019

## 2019-03-07 NOTE — Patient Instructions (Addendum)
Pleasuring see you again Kathleen Cameron  Recommendations: Continue Symbicort 160 - two puffs twice daily Resume Spiriva respimat - two puffs once daily Work on conditioning level   Orders: Overnight oximetry on room air   Oxygen: Continue 2L on exertion and at night for now   Referral: Cariology re: tachycardia/ palpitations  [facility]  RX: Will send in Spiriva HandiHaler prescription (if too expensive please notify office)  Follow-up: FU in 3 months with Dr. Lamonte Sakai  Regarding office visit with PCP regarding tachycardia (palpitations may consider Holter monitor)

## 2019-03-07 NOTE — Telephone Encounter (Signed)
Checked with Derl Barrow, NP and she confirmed the ONO will need to be on room air.   Called back Allen with [employer] to advise. New order submitted. Nothing further needed at this time.

## 2019-03-10 ENCOUNTER — Telehealth: Payer: Self-pay | Admitting: General Surgery

## 2019-03-10 DIAGNOSIS — R0600 Dyspnea, unspecified: Secondary | ICD-10-CM

## 2019-03-10 DIAGNOSIS — J9611 Chronic respiratory failure with hypoxia: Secondary | ICD-10-CM

## 2019-03-10 DIAGNOSIS — R0602 Shortness of breath: Secondary | ICD-10-CM

## 2019-03-10 DIAGNOSIS — R0609 Other forms of dyspnea: Secondary | ICD-10-CM

## 2019-03-10 MED ORDER — BUDESONIDE-FORMOTEROL FUMARATE 160-4.5 MCG/ACT IN AERO: 2.0000 | INHALATION_SPRAY | Freq: Two times a day (BID) | RESPIRATORY_TRACT | 6 refills | Status: DC

## 2019-03-10 NOTE — Telephone Encounter (Signed)
Symbicort brand only ok

## 2019-03-10 NOTE — Telephone Encounter (Signed)
Fax received from [facility] the generic Symbicort is not covered by patient insurance. The alternatives are W[place]ella, Advair, Adair Patter, Dulera, Symbicort.  Prescription resent with notation to provide the patient with brand only Symbicort.

## 2019-03-13 NOTE — Progress Notes (Signed)
Cardiology Office Note:    Date:  03/14/2019   ID:  Kathleen Cameron, DOB 01/14/1966, MRN [id number]  PCP:  Georgina QuintSagardia, Miguel Jose, MD  Cardiologist:  No primary care provider on file.  Electrophysiologist:  None   Referring MD: Glenford BayleyWalsh, Elizabeth W, NP   No chief complaint on file.   History of Present Illness:    Kathleen Cameron is a 53 y.o. female with a hx of hypertension, severe obstructive pulmonary disease on home 2L O2 who is referred by Ames DuraElizabeth Walsh, NP for evaluation of palpitations.  She was seen in the ED on 09/23/2018 with shortness of breath.  CTA was negative for PE but did show emphysema.  She was referred to pulmonology.  She reported that she has been experiencing palpitations and dyspnea with minimal exertion, so was referred to cardiology for evaluation.  She reports that she has palpitations where she feels like her heart is racing.  Primarily occurs when she exerts herself.  States that she has palpitations with minimal exertion.  Denies any exertional chest pain.  Does report that she gets short of breath with minimal exertion.    Underwent an echocardiogram on 12/20/2018, which showed normal systolic function (EF greater than 65%, grade 1 diastolic dysfunction, mild LVH, normal RV function, no significant valvular disease, PASP 42, IVC small/colllapsible.   Past Medical History:  Diagnosis Date  . Hypertension     No past surgical history on file.  Current Medications: Current Meds  Medication Sig  . acetaminophen (TYLENOL) 325 MG tablet Take 2 tablets (650 mg total) by mouth every 6 (six) hours as needed for mild pain (or Fever >/= 101).  [name]. albuterol (VENTOLIN HFA) 108 (90 Base) MCG/ACT inhaler INHALE 2 PUFFS INTO THE LUNGS EVERY 6 HOURS AS NEEDED FOR WHEEZING OR SHORTNESS OF BREATH  . amLODipine (NORVASC) 10 MG tablet Take 1 tablet (10 mg total) by mouth daily.  . budesonide-formoterol (SYMBICORT) 160-4.5 MCG/ACT inhaler Inhale 2 puffs into the  lungs 2 (two) times daily.  . budesonide-formoterol (SYMBICORT) 160-4.5 MCG/ACT inhaler Inhale 2 puffs into the lungs 2 (two) times daily.  . budesonide-formoterol (SYMBICORT) 80-4.5 MCG/ACT inhaler Inhale 2 puffs into the lungs 2 (two) times daily.  [name]. dextromethorphan-guaiFENesin (MUCINEX DM) 30-600 MG 12hr tablet Take 1 tablet by mouth 2 (two) times daily as needed for cough.  [name]. lisinopril (ZESTRIL) 40 MG tablet Take 1 tablet (40 mg total) by mouth daily.  [name]. tiotropium (SPIRIVA) 18 MCG inhalation capsule Place 1 capsule (18 mcg total) into inhaler and inhale daily.  . [DISCONTINUED] furosemide (LASIX) 40 MG tablet TAKE 1 TABLET(40 MG) BY MOUTH DAILY  . [DISCONTINUED] rosuvastatin (CRESTOR) 10 MG tablet Take 1 tablet (10 mg total) by mouth daily.     Allergies:   Stiolto respimat [tiotropium bromide-olodaterol]   Social History   Socioeconomic History  . Marital status: Single    Spouse name: Not on file  . Number of children: Not on file  . Years of education: Not on file  . Highest education level: Not on file  Occupational History  . Not on file  Social Needs  . Financial resource strain: Not on file  . Food insecurity    Worry: Not on file    Inability: Not on file  . Transportation needs    Medical: Not on file    Non-medical: Not on file  Tobacco Use  . Smoking status: Never Smoker  . Smokeless tobacco: Never Used  Substance and Sexual  Activity  . Alcohol use: Yes    Alcohol/week: 2.0 standard drinks    Types: 2 Cans of beer per week  . Drug use: Never  . Sexual activity: Not on file  Lifestyle  . Physical activity    Days per week: Not on file    Minutes per session: Not on file  . Stress: Not on file  Relationships  . Social Musician on phone: Not on file    Gets together: Not on file    Attends religious service: Not on file    Active member of club or organization: Not on file    Attends meetings of clubs or organizations: Not on file     Relationship status: Not on file  Other Topics Concern  . Not on file  Social History Narrative  . Not on file     Family History: The patient's Family history is unknown by patient.  ROS:   Please see the history of present illness.    All other systems reviewed and are negative.  EKGs/Labs/Other Studies Reviewed:    The following studies were reviewed today:   EKG:  EKG is  ordered today.  The ekg ordered today demonstrates normal sinus rhythm, rate 92, poor R wave progression, T wave flattening inlimb leads  TTE 12/20/18:  1. The left ventricle has hyperdynamic systolic function, with an ejection fraction of >65%. The cavity size was normal. There is mildly increased left ventricular wall thickness. Left ventricular diastolic Doppler parameters are consistent with  impaired relaxation. No evidence of left ventricular regional wall motion abnormalities.  2. The aortic root is normal in size and structure.  3. The aortic valve is tricuspid. No stenosis of the aortic valve.  4. Trivial pericardial effusion is present.  5. The right ventricle has normal systolic function. The cavity was normal. There is no increase in right ventricular wall thickness.  6. Normal IVC size with PA systolic pressure 42 mmHg.  7. No evidence of mitral valve stenosis. No significant mitral regurgitation.  Recent Labs: 09/23/2018: B Natriuretic Peptide 22.5 12/20/2018: ALT 13 12/22/2018: BUN 29; Creatinine, Ser 1.12; Hemoglobin 12.2; Platelets 255; Potassium 4.3; Sodium 138  Recent Lipid Panel    Component Value Date/Time   CHOL 300 (H) 03/10/2018 1033   TRIG 112 03/10/2018 1033   HDL 72 03/10/2018 1033   CHOLHDL 4.2 03/10/2018 1033   CHOLHDL 3.2 11/13/2008 0522   VLDL 23 11/13/2008 0522   LDLCALC 206 (H) 03/10/2018 1033    Physical Exam:    VS:  BP (!) 158/98   Pulse 92   Ht 5\' 1"  (1.549 m)   Wt 116 lb 6.4 oz (52.8 kg)   SpO2 90%   BMI 21.99 kg/m     Wt Readings from Last 3 Encounters:   03/14/19 116 lb 6.4 oz (52.8 kg)  03/07/19 115 lb (52.2 kg)  02/21/19 115 lb (52.2 kg)     GEN:  in no acute distress, on O2 HEENT: Normal NECK: No JVD LYMPHATICS: No lymphadenopathy CARDIAC: RRR, no murmurs, rubs, gallops RESPIRATORY: Scattered wheezing ABDOMEN: Soft, non-tender, non-distended MUSCULOSKELETAL:  No edema; No deformity  SKIN: Warm and dry NEUROLOGIC:  Alert and oriented x 3 PSYCHIATRIC:  Normal affect   ASSESSMENT:    1. Palpitations   2. Essential hypertension   3. Dyslipidemia   4. Stage 3b chronic kidney disease    PLAN:    In order of problems listed above:  Palpitations: Appears  to be related to exertion.  Does not report any exertional chest pain but does have significant shortness of breath, suspect related to lung disease.  No coronary calcifications on recent CT chest.  Recent TTE showed normal LV systolic function, grade 1 diastolic dysfunction, normal RV function.  Will check Zio patch x3 days monitor for arrhythmia and assess heart rate  Hypertension: On amlodipine 10 mg daily, lisinopril 40 mg daily, and Lasix 40 mg daily.  Appears uncontrolled.  She is euvolemic on exam.  Would recommend switching Lasix to hydrochlorothiazide 25 mg daily for better blood pressure control.  Check BMET in  1 week.  Asked patient to check blood pressure daily for next 2 weeks and call with results.  Hyperlipidemia: On rosuvastatin 10 mg daily.  Last LDL 206 on 03/10/2018.  Given LDL >190, would recommend increasing to high intensity statin, will increase rosuvastatin to 20 mg daily  RTC in 3 months   Medication Adjustments/Labs and Tests Ordered: Current medicines are reviewed at length with the patient today.  Concerns regarding medicines are outlined above.  Orders Placed This Encounter  Procedures  . Basic metabolic panel  . Magnesium  . LONG TERM MONITOR (3-14 DAYS)  . EKG 12-Lead   Meds ordered this encounter  Medications  . rosuvastatin (CRESTOR) 20 MG  tablet    Sig: Take 1 tablet (20 mg total) by mouth daily.    Dispense:  90 tablet    Refill:  3  . hydrochlorothiazide (HYDRODIURIL) 25 MG tablet    Sig: Take 1 tablet (25 mg total) by mouth daily.    Dispense:  90 tablet    Refill:  3    Patient Instructions  Medication Instructions:  Stop Lasix Start HCTZ 25 mg daily Increase Crestor to 20 mg daily   Lab Work: Bmet and magnesium in 1 week  Lab order enclosed  Testing/Procedures: Schedule 3 day Zio Monitor  Follow-Up: At [facility], you and your health needs are our priority.  As part of our continuing mission to provide you with exceptional heart care, we have created designated Provider Care Teams.  These Care Teams include your primary Cardiologist (physician) and Advanced Practice Providers (APPs -  Physician Assistants and Nurse Practitioners) who all work together to provide you with the care you need, when you need it.  Your next appointment:  3 months   The format for your next appointment:  Office   Provider:  Dr.Britanni Yarde   Check blood pressure daily for 2 weeks   Call office to report readings    Signed, Donato Heinz, MD  03/14/2019 9:06 AM    [place]

## 2019-03-14 ENCOUNTER — Other Ambulatory Visit: Payer: Self-pay

## 2019-03-14 ENCOUNTER — Encounter: Payer: Self-pay | Admitting: Cardiology

## 2019-03-14 ENCOUNTER — Ambulatory Visit (INDEPENDENT_AMBULATORY_CARE_PROVIDER_SITE_OTHER): Payer: Commercial Managed Care - PPO | Admitting: Cardiology

## 2019-03-14 VITALS — BP 158/98 | HR 92 | Ht 61.0 in | Wt 116.4 lb

## 2019-03-14 DIAGNOSIS — R002 Palpitations: Secondary | ICD-10-CM

## 2019-03-14 DIAGNOSIS — I10 Essential (primary) hypertension: Secondary | ICD-10-CM | POA: Diagnosis not present

## 2019-03-14 DIAGNOSIS — E785 Hyperlipidemia, unspecified: Secondary | ICD-10-CM | POA: Diagnosis not present

## 2019-03-14 DIAGNOSIS — N1832 Chronic kidney disease, stage 3b: Secondary | ICD-10-CM

## 2019-03-14 MED ORDER — ROSUVASTATIN CALCIUM 20 MG PO TABS: 20.0000 mg | ORAL_TABLET | Freq: Every day | ORAL | 3 refills | Status: DC

## 2019-03-14 MED ORDER — HYDROCHLOROTHIAZIDE 25 MG PO TABS: 25.0000 mg | ORAL_TABLET | Freq: Every day | ORAL | 3 refills | Status: DC

## 2019-03-14 NOTE — Patient Instructions (Signed)
Medication Instructions:  Stop Lasix Start HCTZ 25 mg daily Increase Crestor to 20 mg daily   Lab Work: Bmet and magnesium in 1 week  Lab order enclosed  Testing/Procedures: Schedule 3 day Zio Monitor  Follow-Up: At [facility], you and your health needs are our priority.  As part of our continuing mission to provide you with exceptional heart care, we have created designated Provider Care Teams.  These Care Teams include your primary Cardiologist (physician) and Advanced Practice Providers (APPs -  Physician Assistants and Nurse Practitioners) who all work together to provide you with the care you need, when you need it.  Your next appointment:  3 months   The format for your next appointment:  Office   Provider:  Dr.Schumann   Check blood pressure daily for 2 weeks   Call office to report readings

## 2019-03-23 ENCOUNTER — Telehealth: Payer: Self-pay | Admitting: Radiology

## 2019-03-23 LAB — BASIC METABOLIC PANEL
BUN/Creatinine Ratio: 21 (ref 9–23)
BUN: 34 mg/dL — ABNORMAL HIGH (ref 6–24)
CO2: 34 mmol/L — ABNORMAL HIGH (ref 20–29)
Calcium: 10.6 mg/dL — ABNORMAL HIGH (ref 8.7–10.2)
Chloride: 84 mmol/L — ABNORMAL LOW (ref 96–106)
Creatinine, Ser: 1.62 mg/dL — ABNORMAL HIGH (ref 0.57–1.00)
GFR calc Af Amer: 41 mL/min/{1.73_m2} — ABNORMAL LOW (ref >59)
GFR calc non Af Amer: 35 mL/min/{1.73_m2} — ABNORMAL LOW (ref >59)
Glucose: 80 mg/dL (ref 65–99)
Potassium: 3.2 mmol/L — ABNORMAL LOW (ref 3.5–5.2)
Sodium: 136 mmol/L (ref 134–144)

## 2019-03-23 LAB — MAGNESIUM: Magnesium: 2.5 mg/dL — ABNORMAL HIGH (ref 1.6–2.3)

## 2019-03-23 NOTE — Telephone Encounter (Signed)
Enrolled patient for a 3 day Zio monitor to be mailed.  

## 2019-03-24 ENCOUNTER — Other Ambulatory Visit: Payer: Self-pay

## 2019-03-24 DIAGNOSIS — Z79899 Other long term (current) drug therapy: Secondary | ICD-10-CM

## 2019-03-30 ENCOUNTER — Inpatient Hospital Stay (INDEPENDENT_AMBULATORY_CARE_PROVIDER_SITE_OTHER): Payer: Commercial Managed Care - PPO

## 2019-03-30 DIAGNOSIS — E785 Hyperlipidemia, unspecified: Secondary | ICD-10-CM | POA: Diagnosis not present

## 2019-03-30 DIAGNOSIS — I10 Essential (primary) hypertension: Secondary | ICD-10-CM | POA: Diagnosis not present

## 2019-03-30 DIAGNOSIS — N1832 Chronic kidney disease, stage 3b: Secondary | ICD-10-CM | POA: Diagnosis not present

## 2019-03-30 DIAGNOSIS — R002 Palpitations: Secondary | ICD-10-CM | POA: Diagnosis not present

## 2019-04-01 ENCOUNTER — Telehealth: Payer: Self-pay | Admitting: Primary Care

## 2019-04-01 DIAGNOSIS — G4734 Idiopathic sleep related nonobstructive alveolar hypoventilation: Secondary | ICD-10-CM

## 2019-04-01 NOTE — Telephone Encounter (Signed)
Spoke with patient. She does have O2 at home and it is set at 2L continuous from [facility] Advised her that I would send an order to them to let them know she will be using O2 at night, she verbalized understanding.   She also stated that she has never been tested for OSA that she is aware of.   Beth, please advise.

## 2019-04-01 NOTE — Telephone Encounter (Signed)
ONO showed baseline O2 83% RA, she spent 8 hours 37 mins <88% with low of 76%. Patient needs to wear 2.5-3L oxygen at night.    Does she have oxygen at home still? Has she ever been tested for sleep apnea? Told she snores or stops breathing?

## 2019-04-01 NOTE — Telephone Encounter (Signed)
That's it for now. I would recommend at next visit we assess Epworth scale and sleep history.

## 2019-04-01 NOTE — Telephone Encounter (Signed)
Noted.  Will close encounter.  

## 2019-04-04 ENCOUNTER — Telehealth: Payer: Self-pay | Admitting: Emergency Medicine

## 2019-04-04 NOTE — Telephone Encounter (Signed)
Called and spoke to patient. Patient stated she can't afford the Spiriva 18 mcg capsule inhaler. Patient did inquire about patient assistance. I gave her the patient assistance number which for [employer] is [phone].  Patient wanted to know if she could have any samples. We do not carry samples of the capsule Spiriva.    Beth would you want to give the patient a sample of the respimat or anything else until she can get patient assistance?  Patient did state that she has her Symbicort.

## 2019-04-04 NOTE — Telephone Encounter (Signed)
Medication Samples have been provided to the patient. Pt advised to pick up. Nothing further is needed.   Drug name: Spiriva 2.5       Strength: 2.5       Qty: 2 boxes LOT: [id number]  Exp.Date: 02/19/2021  Dosing instructions: 2 puffs once daily  The patient has been instructed regarding the correct time, dose, and frequency of taking this medication, including desired effects and most common side effects.   Ritta Slot 5:13 PM 04/04/2019

## 2019-04-04 NOTE — Telephone Encounter (Signed)
Yes we can give her samples of spiriva respimat 2.5

## 2019-04-04 NOTE — Telephone Encounter (Signed)
Pt still waiting to hear if she can come pick up any inhalers? Please advise CB# [phone]

## 2019-04-05 MED ORDER — SPIRIVA RESPIMAT 2.5 MCG/ACT IN AERS: 2.0000 | INHALATION_SPRAY | Freq: Every day | RESPIRATORY_TRACT | 0 refills | Status: DC

## 2019-04-05 NOTE — Addendum Note (Signed)
Addended by: Jannette Spanner on: 04/05/2019 09:08 AM   Modules accepted: Orders

## 2019-04-06 ENCOUNTER — Other Ambulatory Visit: Payer: Self-pay

## 2019-04-06 ENCOUNTER — Ambulatory Visit: Payer: Commercial Managed Care - PPO | Admitting: Emergency Medicine

## 2019-04-06 ENCOUNTER — Encounter: Payer: Self-pay | Admitting: Emergency Medicine

## 2019-04-06 ENCOUNTER — Telehealth (INDEPENDENT_AMBULATORY_CARE_PROVIDER_SITE_OTHER): Payer: Commercial Managed Care - PPO | Admitting: Emergency Medicine

## 2019-04-06 VITALS — BP 117/97 | Ht 61.0 in | Wt 116.0 lb

## 2019-04-06 DIAGNOSIS — N1832 Chronic kidney disease, stage 3b: Secondary | ICD-10-CM

## 2019-04-06 DIAGNOSIS — J439 Emphysema, unspecified: Secondary | ICD-10-CM | POA: Diagnosis not present

## 2019-04-06 DIAGNOSIS — E785 Hyperlipidemia, unspecified: Secondary | ICD-10-CM

## 2019-04-06 DIAGNOSIS — K5900 Constipation, unspecified: Secondary | ICD-10-CM

## 2019-04-06 DIAGNOSIS — I10 Essential (primary) hypertension: Secondary | ICD-10-CM | POA: Diagnosis not present

## 2019-04-06 MED ORDER — AMLODIPINE BESYLATE 10 MG PO TABS: 10.0000 mg | ORAL_TABLET | Freq: Every day | ORAL | 3 refills | Status: DC

## 2019-04-06 NOTE — Progress Notes (Signed)
Telemedicine Encounter- SOAP NOTE Established Patient  This telephone encounter was conducted with the patient's (or proxy's) verbal consent via audio telecommunications: yes Patient was instructed to have this encounter in a suitably private space; and to only have persons present to whom they give permission to participate. In addition, patient identity was confirmed by use of name plus two identifiers (DOB and address).  I discussed the limitations, risks, security and privacy concerns of performing an evaluation and management service by telephone and the availability of in person appointments. I also discussed with the patient that there may be a patient responsible charge related to this service. The patient expressed understanding and agreed to proceed.  I spent a total of 15 minutes talking with the patient or their proxy.  Chief Complaint  Patient presents with  . Hypertension    and per patient she has been constipated for 2 weeks  . Medication Refill    Amlodipine    Subjective   Kathleen Cameron is a 53 y.o. female established patient. Telephone visit today for follow-up of chronic medical conditions: 1.  Hypertension: On amlodipine 10 mg daily lisinopril 40 mg daily.  Recently taken off hydrochlorothiazide due to worsening kidney function.  Blood pressure readings at home within normal limits.  Saw cardiologist recently.  Was started on Zio patch due to complaint of palpitations.  No results available yet.  Recent echocardiogram mostly showed diastolic dysfunction but otherwise within normal limits. IMPRESSIONS    1. The left ventricle has hyperdynamic systolic function, with an ejection fraction of >65%. The cavity size was normal. There is mildly increased left ventricular wall thickness. Left ventricular diastolic Doppler parameters are consistent with  impaired relaxation. No evidence of left ventricular regional wall motion abnormalities.  2. The aortic root is  normal in size and structure.  3. The aortic valve is tricuspid. No stenosis of the aortic valve.  4. Trivial pericardial effusion is present.  5. The right ventricle has normal systolic function. The cavity was normal. There is no increase in right ventricular wall thickness.  6. Normal IVC size with PA systolic pressure 42 mmHg.  7. No evidence of mitral valve stenosis. No significant mitral regurgitation  Lab Results  Component Value Date   CREATININE 1.62 (H) 03/22/2019   BUN 34 (H) 03/22/2019   NA 136 03/22/2019   K 3.2 (L) 03/22/2019   CL 84 (L) 03/22/2019   CO2 34 (H) 03/22/2019  Electrolyte picture above compatible with volume contraction alkalosis most likely secondary to diuretic use. 2. severe COPD emphysema type oxygen dependent.  On several inhalers and seeing pulmonary doctor on a regular basis. 3.  Chronic kidney disease 3B 4.  Dyslipidemia on rosuvastatin 20 mg daily. Today also complaining of constipation for the past 2 weeks. HPI   Patient Active Problem List   Diagnosis Date Noted  . Palpitations 03/07/2019  . Chronic respiratory failure with hypoxia (HCC) 03/07/2019  . CKD (chronic kidney disease) stage 3, GFR 30-59 ml/min 01/05/2019  . Abnormal CT of the chest 10/14/2018  . Pulmonary emphysema (HCC) 09/29/2018  . Pulmonary nodules 09/27/2018  . Dyslipidemia 07/07/2018  . Essential hypertension 03/10/2018    Past Medical History:  Diagnosis Date  . Hypertension     Current Outpatient Medications  Medication Sig Dispense Refill  . acetaminophen (TYLENOL) 325 MG tablet Take 2 tablets (650 mg total) by mouth every 6 (six) hours as needed for mild pain (or Fever >/= 101).    .[name]  albuterol (VENTOLIN HFA) 108 (90 Base) MCG/ACT inhaler INHALE 2 PUFFS INTO THE LUNGS EVERY 6 HOURS AS NEEDED FOR WHEEZING OR SHORTNESS OF BREATH 6.7 g 0  . amLODipine (NORVASC) 10 MG tablet Take 1 tablet (10 mg total) by mouth daily. 90 tablet 3  . budesonide-formoterol (SYMBICORT)  160-4.5 MCG/ACT inhaler Inhale 2 puffs into the lungs 2 (two) times daily. 1 Inhaler 0  . rosuvastatin (CRESTOR) 20 MG tablet Take 1 tablet (20 mg total) by mouth daily. 90 tablet 3  . Tiotropium Bromide Monohydrate (SPIRIVA RESPIMAT) 2.5 MCG/ACT AERS Inhale 2 puffs into the lungs daily. 8 g 0  . budesonide-formoterol (SYMBICORT) 160-4.5 MCG/ACT inhaler Inhale 2 puffs into the lungs 2 (two) times daily. 1 Inhaler 6  . budesonide-formoterol (SYMBICORT) 80-4.5 MCG/ACT inhaler Inhale 2 puffs into the lungs 2 (two) times daily. (Patient not taking: Reported on 04/06/2019) 1 Inhaler 6  . dextromethorphan-guaiFENesin (MUCINEX DM) 30-600 MG 12hr tablet Take 1 tablet by mouth 2 (two) times daily as needed for cough. (Patient not taking: Reported on 04/06/2019) 30 tablet 2  . lisinopril (ZESTRIL) 40 MG tablet Take 1 tablet (40 mg total) by mouth daily. 90 tablet 3  . tiotropium (SPIRIVA) 18 MCG inhalation capsule Place 1 capsule (18 mcg total) into inhaler and inhale daily. (Patient not taking: Reported on 04/06/2019) 30 capsule 6   No current facility-administered medications for this visit.    Allergies  Allergen Reactions  . Stiolto Respimat [Tiotropium Bromide-Olodaterol] Other (See Comments)    Severe headaches and rapid heartrate    Social History   Socioeconomic History  . Marital status: Single    Spouse name: Not on file  . Number of children: Not on file  . Years of education: Not on file  . Highest education level: Not on file  Occupational History  . Not on file  Tobacco Use  . Smoking status: Never Smoker  . Smokeless tobacco: Never Used  Substance and Sexual Activity  . Alcohol use: Yes    Alcohol/week: 2.0 standard drinks    Types: 2 Cans of beer per week  . Drug use: Never  . Sexual activity: Not on file  Other Topics Concern  . Not on file  Social History Narrative  . Not on file   Social Determinants of Health   Financial Resource Strain:   . Difficulty of Paying  Living Expenses: Not on file  Food Insecurity:   . Worried About Charity fundraiser in the Last Year: Not on file  . Ran Out of Food in the Last Year: Not on file  Transportation Needs:   . Lack of Transportation (Medical): Not on file  . Lack of Transportation (Non-Medical): Not on file  Physical Activity:   . Days of Exercise per Week: Not on file  . Minutes of Exercise per Session: Not on file  Stress:   . Feeling of Stress : Not on file  Social Connections:   . Frequency of Communication with Friends and Family: Not on file  . Frequency of Social Gatherings with Friends and Family: Not on file  . Attends Religious Services: Not on file  . Active Member of Clubs or Organizations: Not on file  . Attends Archivist Meetings: Not on file  . Marital Status: Not on file  Intimate Partner Violence:   . Fear of Current or Ex-Partner: Not on file  . Emotionally Abused: Not on file  . Physically Abused: Not on file  . Sexually  Abused: Not on file    Review of Systems  Constitutional: Negative.  Negative for chills and fever.  HENT: Negative.  Negative for congestion and sore throat.   Respiratory: Positive for shortness of breath (Mostly on exertion).   Cardiovascular: Positive for palpitations. Negative for chest pain and leg swelling.  Gastrointestinal: Positive for constipation. Negative for abdominal pain, diarrhea, nausea and vomiting.  Musculoskeletal: Negative.  Negative for myalgias.  Skin: Negative.  Negative for rash.  Neurological: Negative for dizziness and headaches.  All other systems reviewed and are negative.   Objective   Vitals as reported by the patient: Today's Vitals   04/06/19 1029  BP: (!) 117/97  Weight: 116 lb (52.6 kg)  Height: 5\' 1"  (1.549 m)    Elycia was seen today for hypertension and medication refill.  Diagnoses and all orders for this visit:  Essential hypertension Comments: Improved Orders: -     amLODipine (NORVASC)  10 MG tablet; Take 1 tablet (10 mg total) by mouth daily.  Pulmonary emphysema, unspecified emphysema type (HCC)  Stage 3b chronic kidney disease  Dyslipidemia  Constipation, unspecified constipation type   Clinically stable.  No medical concerns identified during this visit. Continue present medications.  Avoid use of diuretics.  BMP repeat as advised by cardiologist. Constipation instructions given. Office visit in 3 months.   I discussed the assessment and treatment plan with the patient. The patient was provided an opportunity to ask questions and all were answered. The patient agreed with the plan and demonstrated an understanding of the instructions.   The patient was advised to call back or seek an in-person evaluation if the symptoms worsen or if the condition fails to improve as anticipated.  I provided 15 minutes of non-face-to-face time during this encounter.  Adela Lank, MD  Primary Care at [facility]

## 2019-04-13 ENCOUNTER — Other Ambulatory Visit: Payer: Self-pay

## 2019-04-13 ENCOUNTER — Inpatient Hospital Stay (HOSPITAL_COMMUNITY)
Admission: EM | Admit: 2019-04-13 | Discharge: 2019-04-17 | DRG: 190 | Disposition: A | Discharge status: Home / Self Care | Payer: 59 | Attending: Internal Medicine | Admitting: Internal Medicine

## 2019-04-13 ENCOUNTER — Emergency Department (HOSPITAL_COMMUNITY): Payer: 59

## 2019-04-13 DIAGNOSIS — I248 Other forms of acute ischemic heart disease: Secondary | ICD-10-CM | POA: Diagnosis not present

## 2019-04-13 DIAGNOSIS — J449 Chronic obstructive pulmonary disease, unspecified: Secondary | ICD-10-CM | POA: Diagnosis present

## 2019-04-13 DIAGNOSIS — N1832 Chronic kidney disease, stage 3b: Secondary | ICD-10-CM

## 2019-04-13 DIAGNOSIS — Z9981 Dependence on supplemental oxygen: Secondary | ICD-10-CM

## 2019-04-13 DIAGNOSIS — I5032 Chronic diastolic (congestive) heart failure: Secondary | ICD-10-CM | POA: Diagnosis present

## 2019-04-13 DIAGNOSIS — Z20828 Contact with and (suspected) exposure to other viral communicable diseases: Secondary | ICD-10-CM | POA: Diagnosis present

## 2019-04-13 DIAGNOSIS — E785 Hyperlipidemia, unspecified: Secondary | ICD-10-CM | POA: Diagnosis present

## 2019-04-13 DIAGNOSIS — I10 Essential (primary) hypertension: Secondary | ICD-10-CM | POA: Diagnosis present

## 2019-04-13 DIAGNOSIS — Z7951 Long term (current) use of inhaled steroids: Secondary | ICD-10-CM | POA: Diagnosis not present

## 2019-04-13 DIAGNOSIS — J9621 Acute and chronic respiratory failure with hypoxia: Secondary | ICD-10-CM | POA: Diagnosis present

## 2019-04-13 DIAGNOSIS — I130 Hypertensive heart and chronic kidney disease with heart failure and stage 1 through stage 4 chronic kidney disease, or unspecified chronic kidney disease: Secondary | ICD-10-CM | POA: Diagnosis present

## 2019-04-13 DIAGNOSIS — Z79899 Other long term (current) drug therapy: Secondary | ICD-10-CM | POA: Diagnosis not present

## 2019-04-13 DIAGNOSIS — R072 Precordial pain: Secondary | ICD-10-CM | POA: Diagnosis present

## 2019-04-13 DIAGNOSIS — R079 Chest pain, unspecified: Secondary | ICD-10-CM | POA: Diagnosis present

## 2019-04-13 DIAGNOSIS — J441 Chronic obstructive pulmonary disease with (acute) exacerbation: Secondary | ICD-10-CM | POA: Diagnosis present

## 2019-04-13 DIAGNOSIS — N182 Chronic kidney disease, stage 2 (mild): Secondary | ICD-10-CM | POA: Diagnosis present

## 2019-04-13 DIAGNOSIS — N189 Chronic kidney disease, unspecified: Secondary | ICD-10-CM | POA: Diagnosis present

## 2019-04-13 DIAGNOSIS — R7989 Other specified abnormal findings of blood chemistry: Secondary | ICD-10-CM | POA: Diagnosis present

## 2019-04-13 DIAGNOSIS — R778 Other specified abnormalities of plasma proteins: Secondary | ICD-10-CM | POA: Diagnosis present

## 2019-04-13 DIAGNOSIS — I214 Non-ST elevation (NSTEMI) myocardial infarction: Secondary | ICD-10-CM | POA: Diagnosis not present

## 2019-04-13 DIAGNOSIS — E876 Hypokalemia: Secondary | ICD-10-CM | POA: Diagnosis present

## 2019-04-13 DIAGNOSIS — J438 Other emphysema: Secondary | ICD-10-CM | POA: Diagnosis not present

## 2019-04-13 LAB — TROPONIN I (HIGH SENSITIVITY)
Troponin I (High Sensitivity): 40 ng/L — ABNORMAL HIGH (ref <18)
Troponin I (High Sensitivity): 99 ng/L — ABNORMAL HIGH (ref <18)
Troponin I (High Sensitivity): 287 ng/L (ref <18)
Troponin I (High Sensitivity): 372 ng/L (ref <18)

## 2019-04-13 LAB — CBC WITH DIFFERENTIAL/PLATELET
Abs Immature Granulocytes: 0.05 10*3/uL (ref 0.00–0.07)
Basophils Absolute: 0.1 10*3/uL (ref 0.0–0.1)
Basophils Relative: 1 %
Eosinophils Absolute: 0.2 10*3/uL (ref 0.0–0.5)
Eosinophils Relative: 2 %
HCT: 37.3 % (ref 36.0–46.0)
Hemoglobin: 12.2 g/dL (ref 12.0–15.0)
Immature Granulocytes: 0 %
Lymphocytes Relative: 13 %
Lymphs Abs: 1.4 10*3/uL (ref 0.7–4.0)
MCH: 29.3 pg (ref 26.0–34.0)
MCHC: 32.7 g/dL (ref 30.0–36.0)
MCV: 89.4 fL (ref 80.0–100.0)
Monocytes Absolute: 0.5 10*3/uL (ref 0.1–1.0)
Monocytes Relative: 5 %
Neutro Abs: 9.1 10*3/uL — ABNORMAL HIGH (ref 1.7–7.7)
Neutrophils Relative %: 79 %
Platelets: 239 10*3/uL (ref 150–400)
RBC: 4.17 MIL/uL (ref 3.87–5.11)
RDW: 12.8 % (ref 11.5–15.5)
WBC: 11.4 10*3/uL — ABNORMAL HIGH (ref 4.0–10.5)
nRBC: 0 % (ref 0.0–0.2)

## 2019-04-13 LAB — LIPID PANEL
Cholesterol: 193 mg/dL (ref 0–200)
HDL: 68 mg/dL (ref >40)
LDL Cholesterol: 108 mg/dL — ABNORMAL HIGH (ref 0–99)
Total CHOL/HDL Ratio: 2.8 RATIO
Triglycerides: 85 mg/dL (ref <150)
VLDL: 17 mg/dL (ref 0–40)

## 2019-04-13 LAB — BASIC METABOLIC PANEL
Anion gap: 15 (ref 5–15)
BUN: 18 mg/dL (ref 6–20)
CO2: 26 mmol/L (ref 22–32)
Calcium: 9.4 mg/dL (ref 8.9–10.3)
Chloride: 100 mmol/L (ref 98–111)
Creatinine, Ser: 1.05 mg/dL — ABNORMAL HIGH (ref 0.44–1.00)
GFR calc Af Amer: >60 mL/min (ref >60)
GFR calc non Af Amer: 60 mL/min — ABNORMAL LOW (ref >60)
Glucose, Bld: 104 mg/dL — ABNORMAL HIGH (ref 70–99)
Potassium: 3.3 mmol/L — ABNORMAL LOW (ref 3.5–5.1)
Sodium: 141 mmol/L (ref 135–145)

## 2019-04-13 LAB — RESPIRATORY PANEL BY RT PCR (FLU A&B, COVID)
Influenza A by PCR: NEGATIVE
Influenza B by PCR: NEGATIVE
SARS Coronavirus 2 by RT PCR: NEGATIVE

## 2019-04-13 LAB — MAGNESIUM: Magnesium: 1.9 mg/dL (ref 1.7–2.4)

## 2019-04-13 LAB — PHOSPHORUS: Phosphorus: 2.8 mg/dL (ref 2.5–4.6)

## 2019-04-13 LAB — POC SARS CORONAVIRUS 2 AG -  ED: SARS Coronavirus 2 Ag: NEGATIVE

## 2019-04-13 LAB — BRAIN NATRIURETIC PEPTIDE: B Natriuretic Peptide: 27.1 pg/mL (ref 0.0–100.0)

## 2019-04-13 MED ORDER — ENOXAPARIN SODIUM 40 MG/0.4ML ~~LOC~~ SOLN: 40.0000 mg | SUBCUTANEOUS | Status: DC

## 2019-04-13 MED ORDER — ALBUTEROL SULFATE (2.5 MG/3ML) 0.083% IN NEBU
2.5000 mg | INHALATION_SOLUTION | Freq: Three times a day (TID) | RESPIRATORY_TRACT | Status: DC
  Administered 2019-04-13 – 2019-04-14 (×2): 2.5 mg via RESPIRATORY_TRACT
  Filled 2019-04-13 (×2): qty 3

## 2019-04-13 MED ORDER — ACETAMINOPHEN 325 MG PO TABS: 650.0000 mg | ORAL_TABLET | Freq: Four times a day (QID) | ORAL | Status: DC | PRN

## 2019-04-13 MED ORDER — TIOTROPIUM BROMIDE MONOHYDRATE 18 MCG IN CAPS: 18.0000 ug | ORAL_CAPSULE | Freq: Every day | RESPIRATORY_TRACT | Status: DC

## 2019-04-13 MED ORDER — HEPARIN (PORCINE) 25000 UT/250ML-% IV SOLN
600.0000 [IU]/h | INTRAVENOUS | Status: DC
  Administered 2019-04-13: 650 [IU]/h via INTRAVENOUS
  Administered 2019-04-15: 600 [IU]/h via INTRAVENOUS
  Filled 2019-04-13 (×2): qty 250

## 2019-04-13 MED ORDER — AMLODIPINE BESYLATE 10 MG PO TABS
10.0000 mg | ORAL_TABLET | Freq: Every day | ORAL | Status: DC
  Administered 2019-04-13 – 2019-04-17 (×5): 10 mg via ORAL
  Filled 2019-04-13 (×5): qty 1

## 2019-04-13 MED ORDER — ACETAMINOPHEN 650 MG RE SUPP: 650.0000 mg | Freq: Four times a day (QID) | RECTAL | Status: DC | PRN

## 2019-04-13 MED ORDER — IOHEXOL 350 MG/ML SOLN
100.0000 mL | Freq: Once | INTRAVENOUS | Status: AC | PRN
  Administered 2019-04-13: 100 mL via INTRAVENOUS

## 2019-04-13 MED ORDER — ONDANSETRON HCL 4 MG PO TABS: 4.0000 mg | ORAL_TABLET | Freq: Four times a day (QID) | ORAL | Status: DC | PRN

## 2019-04-13 MED ORDER — MORPHINE SULFATE (PF) 2 MG/ML IV SOLN
2.0000 mg | INTRAVENOUS | Status: DC | PRN
  Administered 2019-04-13 – 2019-04-16 (×2): 2 mg via INTRAVENOUS
  Filled 2019-04-13 (×2): qty 1

## 2019-04-13 MED ORDER — SODIUM CHLORIDE 0.9 % WEIGHT BASED INFUSION
1.0000 mL/kg/h | INTRAVENOUS | Status: DC
  Administered 2019-04-14: 1 mL/kg/h via INTRAVENOUS

## 2019-04-13 MED ORDER — METHYLPREDNISOLONE SODIUM SUCC 125 MG IJ SOLR
125.0000 mg | Freq: Once | INTRAMUSCULAR | Status: AC
  Administered 2019-04-13: 09:00:00 125 mg via INTRAVENOUS
  Filled 2019-04-13: qty 2

## 2019-04-13 MED ORDER — HYDROXYZINE HCL 10 MG PO TABS
10.0000 mg | ORAL_TABLET | Freq: Four times a day (QID) | ORAL | Status: DC | PRN
  Administered 2019-04-13: 10 mg via ORAL
  Filled 2019-04-13: qty 1

## 2019-04-13 MED ORDER — SODIUM CHLORIDE 0.9 % IV SOLN: 250.0000 mL | INTRAVENOUS | Status: DC | PRN

## 2019-04-13 MED ORDER — ASPIRIN EC 81 MG PO TBEC
81.0000 mg | DELAYED_RELEASE_TABLET | Freq: Every day | ORAL | Status: DC
  Administered 2019-04-13 – 2019-04-17 (×5): 81 mg via ORAL
  Filled 2019-04-13 (×5): qty 1

## 2019-04-13 MED ORDER — ASPIRIN 81 MG PO CHEW
81.0000 mg | CHEWABLE_TABLET | ORAL | Status: AC
  Administered 2019-04-14: 81 mg via ORAL
  Filled 2019-04-13: qty 1

## 2019-04-13 MED ORDER — POTASSIUM CHLORIDE 20 MEQ PO PACK
40.0000 meq | PACK | Freq: Once | ORAL | Status: AC
  Administered 2019-04-13: 40 meq via ORAL
  Filled 2019-04-13: qty 2

## 2019-04-13 MED ORDER — AZITHROMYCIN 500 MG PO TABS
500.0000 mg | ORAL_TABLET | Freq: Every day | ORAL | Status: AC
  Administered 2019-04-13 – 2019-04-15 (×3): 500 mg via ORAL
  Filled 2019-04-13 (×3): qty 1

## 2019-04-13 MED ORDER — ROSUVASTATIN CALCIUM 20 MG PO TABS
20.0000 mg | ORAL_TABLET | Freq: Every day | ORAL | Status: DC
  Administered 2019-04-13 – 2019-04-16 (×4): 20 mg via ORAL
  Filled 2019-04-13 (×4): qty 1

## 2019-04-13 MED ORDER — NITROGLYCERIN 2 % TD OINT
1.0000 [in_us] | TOPICAL_OINTMENT | Freq: Once | TRANSDERMAL | Status: AC
  Administered 2019-04-13: 1 [in_us] via TOPICAL
  Filled 2019-04-13: qty 1

## 2019-04-13 MED ORDER — ALBUTEROL SULFATE HFA 108 (90 BASE) MCG/ACT IN AERS
2.0000 | INHALATION_SPRAY | Freq: Once | RESPIRATORY_TRACT | Status: AC
  Administered 2019-04-13: 2 via RESPIRATORY_TRACT
  Filled 2019-04-13: qty 6.7

## 2019-04-13 MED ORDER — HYDROCHLOROTHIAZIDE 25 MG PO TABS
25.0000 mg | ORAL_TABLET | Freq: Every day | ORAL | Status: DC
  Administered 2019-04-13: 25 mg via ORAL
  Filled 2019-04-13 (×2): qty 1

## 2019-04-13 MED ORDER — METHYLPREDNISOLONE SODIUM SUCC 125 MG IJ SOLR
60.0000 mg | Freq: Two times a day (BID) | INTRAMUSCULAR | Status: DC
  Administered 2019-04-14 (×3): 60 mg via INTRAVENOUS
  Filled 2019-04-13 (×3): qty 2

## 2019-04-13 MED ORDER — ALBUTEROL SULFATE (2.5 MG/3ML) 0.083% IN NEBU: 2.5000 mg | INHALATION_SOLUTION | RESPIRATORY_TRACT | Status: DC

## 2019-04-13 MED ORDER — SODIUM CHLORIDE 0.9% FLUSH
3.0000 mL | Freq: Two times a day (BID) | INTRAVENOUS | Status: DC
  Administered 2019-04-14 – 2019-04-17 (×5): 3 mL via INTRAVENOUS

## 2019-04-13 MED ORDER — ONDANSETRON HCL 4 MG/2ML IJ SOLN: 4.0000 mg | Freq: Four times a day (QID) | INTRAMUSCULAR | Status: DC | PRN

## 2019-04-13 MED ORDER — NITROGLYCERIN 0.4 MG SL SUBL
0.4000 mg | SUBLINGUAL_TABLET | SUBLINGUAL | Status: DC | PRN
  Filled 2019-04-13: qty 1

## 2019-04-13 MED ORDER — SODIUM CHLORIDE 0.9% FLUSH: 3.0000 mL | INTRAVENOUS | Status: DC | PRN

## 2019-04-13 MED ORDER — UMECLIDINIUM BROMIDE 62.5 MCG/INH IN AEPB
1.0000 | INHALATION_SPRAY | Freq: Every day | RESPIRATORY_TRACT | Status: DC
  Administered 2019-04-14 – 2019-04-17 (×4): 1 via RESPIRATORY_TRACT
  Filled 2019-04-13: qty 7

## 2019-04-13 MED ORDER — SODIUM CHLORIDE 0.9 % WEIGHT BASED INFUSION
3.0000 mL/kg/h | INTRAVENOUS | Status: AC
  Administered 2019-04-14: 3 mL/kg/h via INTRAVENOUS

## 2019-04-13 MED ORDER — HEPARIN BOLUS VIA INFUSION
3150.0000 [IU] | Freq: Once | INTRAVENOUS | Status: AC
  Administered 2019-04-13: 3150 [IU] via INTRAVENOUS
  Filled 2019-04-13: qty 3150

## 2019-04-13 NOTE — Progress Notes (Signed)
CRITICAL VALUE ALERT  Critical Value: Troponin level 372  Date & Time Notied:  04/13/19 @2220pm    Provider Notified: Kennon Holter, NP notified  Orders Received/Actions taken:Awaiting for Orders.

## 2019-04-13 NOTE — ED Triage Notes (Signed)
BIB thru [facility], reportedly woke up with SOB. En route she complained of chest pain, was given 0.4 mcg of nitro SL with relief after. Currently CP at 5/10 per patiient. Has hx of COPD, reportedly takes albuterol at home and lasix. Currently 5L O2 saturating in the mid 90's, tachypneic with RR of 27.

## 2019-04-13 NOTE — Significant Event (Addendum)
Rapid Response Event Note  Overview: Chest Pain and SOB  I received a call from nursing staff with concerns of patient having chest pain and SOB. I was with another patient in an emergency, so I instructed the nurse to notify the MD on call. When I arrived at 1815, per nursing, patient's symptoms improved, she was no longer SOB and had no CP. She was given Morphine IV and EKG was done. Upon arrival, patient was alert, oriented, and pleasant. She stated that she felt better. Good air movement in lungs, not in acute distress, skin warm and dry. Patient is pending heart cath tomorrow and she was quite anxious and fearful for that. I spent several minutes talking her, I was able to calm her down, and I asked if she needed something to help calm her tonight so that she can rest, to which she replied yes. RN to page on call MD for orders.   I also suggested that patient be transferred to [place] if needed.    Call Time [place]   Makynzee Tigges R

## 2019-04-13 NOTE — H&P (Signed)
History and Physical    Kathleen Cameron:[id number] DOB: [birth year] DOA: 04/13/2019  PCP: [name], MD  Patient coming from: Home I have personally briefly reviewed patient's old medical records in [facility]  Chief Complaint: Shortness of breath and chest pain  HPI: Kathleen Cameron is a 53 y.o. female with medical history significant of COPD on 2 L of oxygen at home, hypertension, hyperlipidemia, CKD stage III presents to emergency department due to worsening shortness of breath.  Patient tells me that she has chronic shortness of breath however this morning she was not able to catch her breath, associated with wheezing and intermittent midsternal chest pain since 2 weeks, 6 out of 10, pressure-like, feels like elephant sitting on her chest, nonradiating denies association with leg swelling, palpitation, orthopnea, PND, cough, congestion, fever, chills, nausea, vomiting, diarrhea, abdominal pain, headache or blurry vision.  She lives with her husband and denies smoking, alcohol, illicit drug use.  She tells me she never smoked cigarettes.  Reviewed her PFT from 10/14/2018 which shows severe obstructive pulmonary disease.  Transthoracic echo from 12/20/2018 showed preserved ejection fraction with diastolic dysfunction.  She is compliant with her home inhalers.  ED Course: Upon arrival to ED.  Patient tachycardic, hypoxic with oxygen saturation of 89%, tachypneic-placed on 3 L of oxygen via nasal cannula, BNP: WNL, initial troponin 40 trended up to 99, COVID-19, influenza a and B all came back negative.  Chest x-ray and CT angiogram came back negative for acute findings.  EDP consulted cardiology for elevated troponin.  Patient received Ventolin/nitro/steroid loading dose in the ED.  Review of Systems: As per HPI otherwise negative.    Past Medical History:  Diagnosis Date  . Hypertension     No past surgical history on file.   reports that she has  never smoked. She has never used smokeless tobacco. She reports current alcohol use of about 2.0 standard drinks of alcohol per week. She reports that she does not use drugs.  Allergies  Allergen Reactions  . Stiolto Respimat [Tiotropium Bromide-Olodaterol] Other (See Comments)    Severe headaches and rapid heartrate    Family History  Family history unknown: Yes    Prior to Admission medications   Medication Sig Start Date End Date Taking? Authorizing Provider  acetaminophen (TYLENOL) 325 MG tablet Take 2 tablets (650 mg total) by mouth every 6 (six) hours as needed for mild pain (or Fever >/= 101). 12/22/18   [name], MD  albuterol (VENTOLIN HFA) 108 (90 Base) MCG/ACT inhaler INHALE 2 PUFFS INTO THE LUNGS EVERY 6 HOURS AS NEEDED FOR WHEEZING OR SHORTNESS OF BREATH 03/07/19   [name], NP  amLODipine (NORVASC) 10 MG tablet Take 1 tablet (10 mg total) by mouth daily. 04/06/19   [name], MD  budesonide-formoterol [facility]) 160-4.5 MCG/ACT inhaler Inhale 2 puffs into the lungs 2 (two) times daily. 01/05/19   Leslye Peer, MD  budesonide-formoterol (SYMBICORT) 160-4.5 MCG/ACT inhaler Inhale 2 puffs into the lungs 2 (two) times daily. 03/10/19   [name], NP  budesonide-formoterol [facility]) 80-4.5 MCG/ACT inhaler Inhale 2 puffs into the lungs 2 (two) times daily. Patient not taking: Reported on 04/06/2019 03/07/19   [name], NP  dextromethorphan-guaiFENesin [facility] DM) 30-600 MG 12hr tablet Take 1 tablet by mouth 2 (two) times daily as needed for cough. Patient not taking: Reported on 04/06/2019 09/29/18   [name], MD  lisinopril (ZESTRIL) 40 MG tablet Take 1 tablet (  40 mg total) by mouth daily. 09/29/18 03/14/19  Horald Pollen, MD  rosuvastatin (CRESTOR) 20 MG tablet Take 1 tablet (20 mg total) by mouth daily. 03/14/19 06/12/19  Donato Heinz, MD  tiotropium (SPIRIVA) 18 MCG inhalation capsule Place 1 capsule  (18 mcg total) into inhaler and inhale daily. Patient not taking: Reported on 04/06/2019 03/07/19   Martyn Ehrich, NP  Tiotropium Bromide Monohydrate (SPIRIVA RESPIMAT) 2.5 MCG/ACT AERS Inhale 2 puffs into the lungs daily. 04/05/19   Collene Gobble, MD    Physical Exam: Vitals:   04/13/19 1015 04/13/19 1149 04/13/19 1200 04/13/19 1215  BP: (!) 147/101 (!) 142/103 (!) 160/114 (!) 150/108  Pulse: 98 (!) 112 (!) 110 (!) 106  Resp: (!) 21 19 18  (!) 26  Temp:      TempSrc:      SpO2: 97% 98% 100% 100%  Weight:      Height:        Constitutional: NAD, calm, comfortable, on 3 L of oxygen via nasal cannula.   Eyes: PERRL, lids and conjunctivae normal ENMT: Mucous membranes are moist. Posterior pharynx clear of any exudate or lesions.Normal dentition.  Neck: normal, supple, no masses, no thyromegaly Respiratory: clear to auscultation bilaterally, no wheezing, no crackles. Normal respiratory effort. No accessory muscle use.  Cardiovascular: Regular rate and rhythm, no murmurs / rubs / gallops. No extremity edema. 2+ pedal pulses. No carotid bruits.  Abdomen: no tenderness, no masses palpated. No hepatosplenomegaly. Bowel sounds positive.  Musculoskeletal: no clubbing / cyanosis. No joint deformity upper and lower extremities. Good ROM, no contractures. Normal muscle tone.  Skin: no rashes, lesions, ulcers. No induration Neurologic: CN 2-12 grossly intact. Sensation intact, DTR normal. Strength 5/5 in all 4.  Psychiatric: Normal judgment and insight. Alert and oriented x 3. Normal mood.    Labs on Admission: I have personally reviewed following labs and imaging studies  CBC: Recent Labs  Lab 04/13/19 0640  WBC 11.4*  NEUTROABS 9.1*  HGB 12.2  HCT 37.3  MCV 89.4  PLT 213   Basic Metabolic Panel: Recent Labs  Lab 04/13/19 0640  NA 141  K 3.3*  CL 100  CO2 26  GLUCOSE 104*  BUN 18  CREATININE 1.05*  CALCIUM 9.4   GFR: Estimated Creatinine Clearance: 45.7 mL/min (A)  (by C-G formula based on SCr of 1.05 mg/dL (H)). Liver Function Tests: No results for input(s): AST, ALT, ALKPHOS, BILITOT, PROT, ALBUMIN in the last 168 hours. No results for input(s): LIPASE, AMYLASE in the last 168 hours. No results for input(s): AMMONIA in the last 168 hours. Coagulation Profile: No results for input(s): INR, PROTIME in the last 168 hours. Cardiac Enzymes: No results for input(s): CKTOTAL, CKMB, CKMBINDEX, TROPONINI in the last 168 hours. BNP (last 3 results) No results for input(s): PROBNP in the last 8760 hours. HbA1C: No results for input(s): HGBA1C in the last 72 hours. CBG: No results for input(s): GLUCAP in the last 168 hours. Lipid Profile: No results for input(s): CHOL, HDL, LDLCALC, TRIG, CHOLHDL, LDLDIRECT in the last 72 hours. Thyroid Function Tests: No results for input(s): TSH, T4TOTAL, FREET4, T3FREE, THYROIDAB in the last 72 hours. Anemia Panel: No results for input(s): VITAMINB12, FOLATE, FERRITIN, TIBC, IRON, RETICCTPCT in the last 72 hours. Urine analysis:    Component Value Date/Time   COLORURINE YELLOW 11/12/2008 1440   APPEARANCEUR CLEAR 11/12/2008 1440   LABSPEC 1.016 11/12/2008 1440   PHURINE 7.5 11/12/2008 1440   GLUCOSEU NEGATIVE 11/12/2008 1440  HGBUR TRACE (A) 11/12/2008 1440   BILIRUBINUR NEGATIVE 11/12/2008 1440   KETONESUR NEGATIVE 11/12/2008 1440   PROTEINUR NEGATIVE 11/12/2008 1440   UROBILINOGEN 1.0 11/12/2008 1440   NITRITE NEGATIVE 11/12/2008 1440   LEUKOCYTESUR SMALL (A) 11/12/2008 1440    Radiological Exams on Admission: CT Angio Chest PE W and/or Wo Contrast  Result Date: 04/13/2019 CLINICAL DATA:  Increased shortness of breath, mid chest pain for 1 day. EXAM: CT ANGIOGRAPHY CHEST WITH CONTRAST TECHNIQUE: Multidetector CT imaging of the chest was performed using the standard protocol during bolus administration of intravenous contrast. Multiplanar CT image reconstructions and MIPs were obtained to evaluate the  vascular anatomy. CONTRAST:  75 cc OMNIPAQUE IOHEXOL 350 MG/ML SOLN COMPARISON:  Chest CT dated 09/23/2018. FINDINGS: Cardiovascular: Heart size is normal. No pericardial effusion. No thoracic aortic aneurysm or evidence of aortic dissection. Mediastinum/Nodes: Numerous calcified lymph nodes within the mediastinum and perihilar regions indicating previous granulomatous infection. No enlarged or suspicious lymph nodes. Esophagus is unremarkable. Trachea and central bronchi are unremarkable. Lungs/Pleura: Mild scarring/atelectasis at the lung bases. No evidence of pneumonia. No pleural effusion or pneumothorax. Again noted are scattered pleural based nodules which are not significantly changed in the interval, some again noted to be partially calcified suggesting sequela of previous granulomatous infection, silicosis or other pneumoconiosis, sarcoidosis, or less likely sequela of asbestos exposure. Stable emphysematous changes within the upper lobes, mild to moderate in degree. Upper Abdomen: Limited images of the upper abdomen are unremarkable. Musculoskeletal: No acute or suspicious osseous finding. Mild degenerative spondylosis of the scoliotic thoracic spine. Review of the MIP images confirms the above findings. IMPRESSION: 1. No acute findings. No evidence of pneumonia or pulmonary edema. No thoracic aortic aneurysm. 2. Numerous calcified lymph nodes within the mediastinum and perihilar regions, and stable small pleural based nodules within each lung, all of which is compatible with previous granulomatous infection, chronic silicosis or other pneumoconiosis, sarcoidosis or less likely sequela of asbestos exposure. 3. Additional chronic/incidental findings detailed above. Emphysema (ICD10-J43.9). Electronically Signed   By: Bary Richard M.D.   On: 04/13/2019 11:33   DG Chest Port 1 View  Result Date: 04/13/2019 CLINICAL DATA:  Shortness of breath EXAM: PORTABLE CHEST 1 VIEW COMPARISON:  December 20, 2018 chest  radiograph and chest CT September 23, 2018 FINDINGS: Upper lobe pulmonary nodular opacities are better seen on CT but appear stable by radiography. There is no edema or consolidation. Heart size and pulmonary vascularity normal. No adenopathy. Aorta is somewhat tortuous but stable. There is degenerative change in the thoracic spine. IMPRESSION: Stable appearing nodular opacities in the upper lobes, largest on the right. No edema or consolidation. Stable cardiac silhouette. Stable aortic tortuosity. Electronically Signed   By: Bretta Bang III M.D.   On: 04/13/2019 07:04    EKG: Sinus tachycardia, left axis deviation, nonspecific T wave inversions noted.  Assessment/Plan Active Problems:   Essential hypertension   Dyslipidemia   COPD (chronic obstructive pulmonary disease) (HCC)   Acute on chronic respiratory failure with hypoxia (HCC)   CKD (chronic kidney disease)   Hypokalemia   Chest pain   Elevated troponin   Acute on chronic respiratory failure with hypoxia: -Secondary to COPD exacerbation.  She is on 2 L of oxygen via nasal cannula at home at baseline. -Reviewed PFTs from 10/14/2018-patient has severe obstructive pulmonary disease. -She is afebrile with leukocytosis of 11.4. -Patient received IV Solu-Medrol loading dose, Ventolin breathing treatment and nitro in the ED. -Reviewed chest x-ray and CT angiogram  result.  BMP: WNL. -Admit patient on the floor for close monitoring.  Currently she is requiring 3 L of oxygen via nasal cannula.  We will try to wean off of oxygen. -Continue Solu-Medrol IV twice daily, albuterol breathing treatment every 4 hour.  Start on azithromycin 500 mg p.o. daily for 3 days. -COVID-19 and influenza A & B: Negative. -Ordered incentive spirometry.  Chest pain rule out ACS: -Patient presented with anginal symptoms.  Initial troponin 40 trended up to 99.  EKG shows nonspecific T wave inversion. -We will trend troponin.  EDP consulted cardiology -On heparin  gtt as per cardiology -Nitro and morphine as needed for chest pain.  Hypokalemia: Likely secondary to breathing treatment -Check magnesium level -Replaced.  Repeat BMP tomorrow a.m.  CKD stage III: Stable -Continue to monitor.  Hypertension: Blood pressure slightly elevated upon arrival. -We will continue amlodipine and HCTZ and continue to monitor her blood pressure.  Hyperlipidemia: Check lipid panel -Continue statin.  Chronic diastolic congestive heart failure: Not in acute exacerbation. -No signs of fluid overload noted on exam.  BNP: WNL.  Reviewed chest x-ray. -Reviewed echo from 11/2018 which showed preserved ejection fraction. -Strict INO's and daily weight.  Monitor signs for fluid overload.  Monitor electrolytes closely.  DVT prophylaxis: TED/SCD/heparin gtt.  code Status: Full code-confirmed with the patient Family Communication: None present at bedside.  Plan of care discussed with patient in length and she verbalized understanding and agreed with it. Disposition Plan: TBD Consults called: Cardiology by EDP  admission status: Inpatient  Ollen Bowlinka R Vonita Calloway MD [facility] Pager [phone]- [phone]  If 7PM-7AM, please contact night-coverage [email or web address] Password [email or web address]  04/13/2019, 12:39 PM

## 2019-04-13 NOTE — Progress Notes (Signed)
ANTICOAGULATION CONSULT NOTE - Initial Consult  Pharmacy Consult for Heparin  Indication: chest pain/ACS  Allergies  Allergen Reactions  . Stiolto Respimat [Tiotropium Bromide-Olodaterol] Other (See Comments)    Severe headaches and rapid heartrate    Patient Measurements: Height: 5\' 1"  (154.9 cm) Weight: 116 lb (52.6 kg) IBW/kg (Calculated) : 47.8  Vital Signs: Temp: 98 F (36.7 C) (12/23 0622) Temp Source: Oral (12/23 0622) BP: 133/108 (12/23 1300) Pulse Rate: 105 (12/23 1300)  Labs: Recent Labs    04/13/19 0640 04/13/19 0848  HGB 12.2  --   HCT 37.3  --   PLT 239  --   CREATININE 1.05*  --   TROPONINIHS 40* 99*    Estimated Creatinine Clearance: 45.7 mL/min (A) (by C-G formula based on SCr of 1.05 mg/dL (H)).   Medical History: Past Medical History:  Diagnosis Date  . Hypertension     Assessment: 53 y.o. female with a hx of HTN, severe COPD on home O2, palpitations who is being seen today for the evaluation of elevated troponin. Pharmacy consulted for initiation of Heparin for ACS.  Goal of Therapy:  Heparin level 0.3-0.7 units/ml Monitor platelets by anticoagulation protocol: Yes   Plan:  Give 3150 units bolus x 1 Start heparin infusion at 650 units/hr Check anti-Xa level in 8 hours and daily while on heparin Continue to monitor H&H and platelets  Alanda Slim, PharmD, [facility] Clinical Pharmacist Please see AMION for all Pharmacists' Contact Phone Numbers 04/13/2019, 2:21 PM

## 2019-04-13 NOTE — Progress Notes (Signed)
   Follow up troponin continues to rise at 287. She is pain free. Discussed the need for cardiac cath and she is agreeable. Have scheduled for tomorrow. Continue IV heparin, on nitropaste. -- repeat EKG now -- The patient understands that risks included but are not limited to stroke (1 in 1000), death (1 in 1000), kidney failure [usually temporary] (1 in 500), bleeding (1 in 200), allergic reaction [possibly serious] (1 in 200).   SignedReino Bellis, NP-C 04/13/2019, 4:27 PM Pager: [phone]

## 2019-04-13 NOTE — ED Notes (Signed)
Pt ambulated in room on 2L O2, her usual home amount. She is visibly winded, O2 sats 89% after just a few steps. Upon sitting back down, return to 97%

## 2019-04-13 NOTE — Progress Notes (Signed)
Jana Half , RN came to this Agricultural consultant and expressed her concerns for this patient. Patient was having active chest pain and shortness of breath at approximately 1645. EKG completed and ordered morphine given to patient. This RN just went to check on the patient and she denies chest pain and SOB. Vital signs documented in flowsheets. Rapid Response RN notified and made aware. Pahwani, MD notified. MD stated to continue to monitor patient and let cardiology know if patient experiences anymore chest pain. No further orders. Jana Half, RN updated of this information. Will continue to monitor patient.

## 2019-04-13 NOTE — ED Notes (Signed)
Patient transported to CT 

## 2019-04-13 NOTE — ED Provider Notes (Signed)
[facility]   CSN: [id number] Arrival date & time: 04/13/19  [id number]     History Chief Complaint  Patient presents with  . Shortness of Breath  . Chest Pain   Level 5 caveat due to shortness of breath/acuity of condition Kathleen Cameron is a 53 y.o. female.  The history is provided by the patient and the EMS personnel. The history is limited by the condition of the patient.  Shortness of Breath Severity:  Severe Onset quality:  Sudden Timing:  Constant Progression:  Worsening Chronicity:  New Relieved by: NTG. Worsened by:  Nothing Associated symptoms: chest pain and cough   Associated symptoms: no fever   Chest Pain Associated symptoms: cough and shortness of breath   Associated symptoms: no fever   Patient history of COPD on chronic oxygen, palpitations, chronic kidney disease presents with shortness of breath.  She reports she woke up feeling short of breath and had chest tightness.  EMS was called and she was given nitroglycerin with some improvement. Patient is usually on 2 L nasal cannula     Past Medical History:  Diagnosis Date  . Hypertension     Patient Active Problem List   Diagnosis Date Noted  . Palpitations 03/07/2019  . Chronic respiratory failure with hypoxia (HCC) 03/07/2019  . CKD (chronic kidney disease) stage 3, GFR 30-59 ml/min 01/05/2019  . Abnormal CT of the chest 10/14/2018  . Pulmonary emphysema (HCC) 09/29/2018  . Pulmonary nodules 09/27/2018  . Dyslipidemia 07/07/2018  . Essential hypertension 03/10/2018    No past surgical history on file.   OB History   No obstetric history on file.     Family History  Family history unknown: Yes    Social History   Tobacco Use  . Smoking status: Never Smoker  . Smokeless tobacco: Never Used  Substance Use Topics  . Alcohol use: Yes    Alcohol/week: 2.0 standard drinks    Types: 2 Cans of beer per week  . Drug use: Never     Home Medications Prior to Admission medications   Medication Sig Start Date End Date Taking? Authorizing Provider  acetaminophen (TYLENOL) 325 MG tablet Take 2 tablets (650 mg total) by mouth every 6 (six) hours as needed for mild pain (or Fever >/= 101). 12/22/18   Calvert Cantorizwan, Saima, MD  albuterol (VENTOLIN HFA) 108 (90 Base) MCG/ACT inhaler INHALE 2 PUFFS INTO THE LUNGS EVERY 6 HOURS AS NEEDED FOR WHEEZING OR SHORTNESS OF BREATH 03/07/19   Glenford BayleyWalsh, Elizabeth W, NP  amLODipine (NORVASC) 10 MG tablet Take 1 tablet (10 mg total) by mouth daily. 04/06/19   Georgina QuintSagardia, Miguel Jose, MD  budesonide-formoterol [facility](SYMBICORT) 160-4.5 MCG/ACT inhaler Inhale 2 puffs into the lungs 2 (two) times daily. 01/05/19   Leslye PeerByrum, Robert S, MD  budesonide-formoterol (SYMBICORT) 160-4.5 MCG/ACT inhaler Inhale 2 puffs into the lungs 2 (two) times daily. 03/10/19   Glenford BayleyWalsh, Elizabeth W, NP  budesonide-formoterol [facility](SYMBICORT) 80-4.5 MCG/ACT inhaler Inhale 2 puffs into the lungs 2 (two) times daily. Patient not taking: Reported on 04/06/2019 03/07/19   Glenford BayleyWalsh, Elizabeth W, NP  dextromethorphan-guaiFENesin [facility](MUCINEX DM) 30-600 MG 12hr tablet Take 1 tablet by mouth 2 (two) times daily as needed for cough. Patient not taking: Reported on 04/06/2019 09/29/18   Georgina QuintSagardia, Miguel Jose, MD  lisinopril (ZESTRIL) 40 MG tablet Take 1 tablet (40 mg total) by mouth daily. 09/29/18 03/14/19  Georgina QuintSagardia, Miguel Jose, MD  rosuvastatin (CRESTOR) 20 MG tablet Take 1 tablet (  20 mg total) by mouth daily. 03/14/19 06/12/19  Donato Heinz, MD  tiotropium (SPIRIVA) 18 MCG inhalation capsule Place 1 capsule (18 mcg total) into inhaler and inhale daily. Patient not taking: Reported on 04/06/2019 03/07/19   Martyn Ehrich, NP  Tiotropium Bromide Monohydrate (SPIRIVA RESPIMAT) 2.5 MCG/ACT AERS Inhale 2 puffs into the lungs daily. 04/05/19   Collene Gobble, MD    Allergies    Stiolto respimat [tiotropium bromide-olodaterol]  Review of Systems    Review of Systems  Unable to perform ROS: Acuity of condition  Constitutional: Negative for fever.  Respiratory: Positive for cough and shortness of breath.   Cardiovascular: Positive for chest pain.    Physical Exam Updated Vital Signs BP (!) 135/94 (BP Location: Left Arm)   Pulse (!) 18   Temp 98 F (36.7 C) (Oral)   Resp (!) 24   Ht 1.549 m (5\' 1" )   Wt 52.6 kg   SpO2 100%   BMI 21.92 kg/m   Physical Exam CONSTITUTIONAL: Chronically ill-appearing, distress none HEAD: Normocephalic/atraumatic EYES: EOMI ENMT: Mucous membranes moist NECK: supple no meningeal signs SPINE/BACK:entire spine nontender CV: Tachycardic LUNGS: Tachypnea, crackles bilaterally ABDOMEN: soft, nontender NEURO: Pt is awake/alert/appropriate, moves all extremitiesx4.  No facial droop.   EXTREMITIES: pulses normal/equal, full ROM, no lower extremity edema, no calf tenderness SKIN: warm, color normal PSYCH: Unable to assess  ED Results / Procedures / Treatments   Labs (all labs ordered are listed, but only abnormal results are displayed) Labs Reviewed  CBC WITH DIFFERENTIAL/PLATELET - Abnormal; Notable for the following components:      Result Value   WBC 11.4 (*)    Neutro Abs 9.1 (*)    All other components within normal limits  BASIC METABOLIC PANEL  BRAIN NATRIURETIC PEPTIDE  POC SARS CORONAVIRUS 2 AG -  ED  TROPONIN I (HIGH SENSITIVITY)    EKG EKG Interpretation  Date/Time:  Wednesday April 13 2019 06:19:13 EST Ventricular Rate:  117 PR Interval:    QRS Duration: 98 QT Interval:  342 QTC Calculation: 478 R Axis:   11 Text Interpretation: Sinus tachycardia Anteroseptal infarct, old Interpretation limited secondary to artifact Abnormal ekg Confirmed by Ripley Fraise [phone]) on 04/13/2019 6:27:11 AM   Radiology No results found.  Procedures Procedures  Medications Ordered in ED Medications - No data to display  ED Course  I have reviewed the triage vital signs and  the nursing notes.  Pertinent labs & imaging results that were available during my care of the patient were reviewed by me and considered in my medical decision making (see chart for details).    MDM Rules/Calculators/A&P                      7:02 AM Pt with SOB This could be mixed COPD/CHF Signed out to Dr. Darl Householder to f/u on labs/imaging/EKG    Kathleen Cameron was evaluated in Emergency Department on 04/13/2019 for the symptoms described in the history of present illness. She was evaluated in the context of the global COVID-19 pandemic, which necessitated consideration that the patient might be at risk for infection with the SARS-CoV-2 virus that causes COVID-19. Institutional protocols and algorithms that pertain to the evaluation of patients at risk for COVID-19 are in a state of rapid change based on information released by regulatory bodies including the CDC and federal and state organizations. These policies and algorithms were followed during the patient's care in the ED.  Final  Clinical Impression(s) / ED Diagnoses Final diagnoses:  None    Rx / DC Orders ED Discharge Orders    None       Zadie Rhine, MD 04/13/19 [phone]

## 2019-04-13 NOTE — Consult Note (Addendum)
Cardiology Consultation:   Patient ID: Kathleen SchlatterJacqueline B Carruthers MRN: [id number]; DOB: 03/22/1966  Admit date: 04/13/2019 Date of Consult: 04/13/2019  Primary Care Provider: Georgina QuintSagardia, Miguel Jose, MD Primary Cardiologist: Little Ishikawahristopher L Heide Brossart, MD  Primary Electrophysiologist:  None    Patient Profile:   Kathleen Cameron is a 53 y.o. female with a hx of HTN, severe COPD on home O2, palpitations who is being seen today for the evaluation of elevated troponin at the request of Dr. Silverio LayYao.  History of Present Illness:   Kathleen Cameron is a 53 yo female with PMH noted above. She was seen by Dr. Bjorn PippinSchumann back 03/14/19 after being referred by her PCP for palpitations. Reported being seen in the ED 09/2018 with shortness of breath. CTA was negative for PE but showed emphysema. As a result she was referred to pulmonology. Stated she had been experiencing palpitations and dyspnea with minimal exertion. No chest pain. Had an echo back 12/20/18 that showed normal LV function, EF of 65%, G1DD, mild LVH. Plan was to have her wear a Zio patch for 3 days. Her blood pressure was noted to be elevated as well. Lasix was switched to hydrochlorothiazide. Also increased her statin to Crestor 20mg  daily.   Reports she felt somewhat short of breath yesterday but was manageable. This morning when she woke up she felt like she could not catch her breath. Noticed her heart was racing, and then developed left sided chest tightness. Called EMS, given SL nitroglycerin without significant improvement. Initially was requiring [place] 5L but able to wean to 3L.   In the ED her labs were notable for K+ 3.3, Cr 1.05, BNP 27, HsT 40>>99, WBC 11.4, Hgb 12.2. EKG showed ST with left axis deviation, with slight ST depression in inferolateral leads. Nitropaste was placed in the ED, but still with mild chest pressure. Breathing seems to have improved. COVID negative.   Heart Pathway Score:  HEAR Score: 4  Past Medical History:    Diagnosis Date   Hypertension     No past surgical history on file.   Home Medications:  Prior to Admission medications   Medication Sig Start Date End Date Taking? Authorizing Provider  acetaminophen (TYLENOL) 325 MG tablet Take 2 tablets (650 mg total) by mouth every 6 (six) hours as needed for mild pain (or Fever >/= 101). 12/22/18   Calvert Cantorizwan, Saima, MD  albuterol (VENTOLIN HFA) 108 (90 Base) MCG/ACT inhaler INHALE 2 PUFFS INTO THE LUNGS EVERY 6 HOURS AS NEEDED FOR WHEEZING OR SHORTNESS OF BREATH 03/07/19   Glenford BayleyWalsh, Elizabeth W, NP  amLODipine (NORVASC) 10 MG tablet Take 1 tablet (10 mg total) by mouth daily. 04/06/19   Georgina QuintSagardia, Miguel Jose, MD  budesonide-formoterol [facility](SYMBICORT) 160-4.5 MCG/ACT inhaler Inhale 2 puffs into the lungs 2 (two) times daily. 01/05/19   Leslye PeerByrum, Robert S, MD  budesonide-formoterol (SYMBICORT) 160-4.5 MCG/ACT inhaler Inhale 2 puffs into the lungs 2 (two) times daily. 03/10/19   Glenford BayleyWalsh, Elizabeth W, NP  budesonide-formoterol [facility](SYMBICORT) 80-4.5 MCG/ACT inhaler Inhale 2 puffs into the lungs 2 (two) times daily. Patient not taking: Reported on 04/06/2019 03/07/19   Glenford BayleyWalsh, Elizabeth W, NP  dextromethorphan-guaiFENesin [facility](MUCINEX DM) 30-600 MG 12hr tablet Take 1 tablet by mouth 2 (two) times daily as needed for cough. Patient not taking: Reported on 04/06/2019 09/29/18   Georgina QuintSagardia, Miguel Jose, MD  lisinopril (ZESTRIL) 40 MG tablet Take 1 tablet (40 mg total) by mouth daily. 09/29/18 03/14/19  Georgina QuintSagardia, Miguel Jose, MD  rosuvastatin (CRESTOR) 20 MG tablet Take  1 tablet (20 mg total) by mouth daily. 03/14/19 06/12/19  [name], MD  tiotropium (SPIRIVA) 18 MCG inhalation capsule Place 1 capsule (18 mcg total) into inhaler and inhale daily. Patient not taking: Reported on 04/06/2019 03/07/19   [name], NP  Tiotropium Bromide Monohydrate (SPIRIVA RESPIMAT) 2.5 MCG/ACT AERS Inhale 2 puffs into the lungs daily. 04/05/19   Leslye Peer, MD    Inpatient  Medications: Scheduled Meds:  Continuous Infusions:  PRN Meds:   Allergies:    Allergies  Allergen Reactions   Stiolto Respimat [Tiotropium Bromide-Olodaterol] Other (See Comments)    Severe headaches and rapid heartrate    Social History:   Social History   Socioeconomic History   Marital status: Single    Spouse name: Not on file   Number of children: Not on file   Years of education: Not on file   Highest education level: Not on file  Occupational History   Not on file  Tobacco Use   Smoking status: Never Smoker   Smokeless tobacco: Never Used  Substance and Sexual Activity   Alcohol use: Yes    Alcohol/week: 2.0 standard drinks    Types: 2 Cans of beer per week   Drug use: Never   Sexual activity: Not on file  Other Topics Concern   Not on file  Social History Narrative   Not on file   Social Determinants of Health   Financial Resource Strain:    Difficulty of Paying Living Expenses: Not on file  Food Insecurity:    Worried About Running Out of Food in the Last Year: Not on file   Ran Out of Food in the Last Year: Not on file  Transportation Needs:    Lack of Transportation (Medical): Not on file   Lack of Transportation (Non-Medical): Not on file  Physical Activity:    Days of Exercise per Week: Not on file   Minutes of Exercise per Session: Not on file  Stress:    Feeling of Stress : Not on file  Social Connections:    Frequency of Communication with Friends and Family: Not on file   Frequency of Social Gatherings with Friends and Family: Not on file   Attends Religious Services: Not on file   Active Member of Clubs or Organizations: Not on file   Attends Banker Meetings: Not on file   Marital Status: Not on file  Intimate Partner Violence:    Fear of Current or Ex-Partner: Not on file   Emotionally Abused: Not on file   Physically Abused: Not on file   Sexually Abused: Not on file    Family  History:    Family History  Family history unknown: Yes     ROS:  Please see the history of present illness.   All other ROS reviewed and negative.     Physical Exam/Data:   Vitals:   04/13/19 1200 04/13/19 1215 04/13/19 1245 04/13/19 1300  BP: (!) 160/114 (!) 150/108 (!) 141/100 (!) 133/108  Pulse: (!) 110 (!) 106 (!) 103 (!) 105  Resp: 18 (!) 26  (!) 22  Temp:      TempSrc:      SpO2: 100% 100% 98% 100%  Weight:      Height:        Intake/Output Summary (Last 24 hours) at 04/13/2019 1340 Last data filed at 04/13/2019 1220 Gross per 24 hour  Intake --  Output 100 ml  Net -100 ml  Last 3 Weights 04/13/2019 04/06/2019 03/14/2019  Weight (lbs) 116 lb 116 lb 116 lb 6.4 oz  Weight (kg) 52.617 kg 52.617 kg 52.799 kg     Body mass index is 21.92 kg/m.  General:  Well nourished, well developed, in no acute distress. Wearing [place]. HEENT: normal Lymph: no adenopathy Neck: no JVD Endocrine:  No thryomegaly Vascular: No carotid bruits; FA pulses 2+ bilaterally without bruits  Cardiac:  normal S1, S2; RRR; no murmur  Lungs:  Mild wheezing. Abd: soft, nontender, no hepatomegaly  Ext: no edema Musculoskeletal:  No deformities, BUE and BLE strength normal and equal Skin: warm and dry  Neuro:  CNs 2-12 intact, no focal abnormalities noted Psych:  Normal affect   EKG:  The EKG was personally reviewed and demonstrates:  ST with left axis deviation, slight ST depression in inferolateral leads  Relevant CV Studies:  TTE: 12/20/18  IMPRESSIONS    1. The left ventricle has hyperdynamic systolic function, with an ejection fraction of >65%. The cavity size was normal. There is mildly increased left ventricular wall thickness. Left ventricular diastolic Doppler parameters are consistent with  impaired relaxation. No evidence of left ventricular regional wall motion abnormalities.  2. The aortic root is normal in size and structure.  3. The aortic valve is tricuspid. No  stenosis of the aortic valve.  4. Trivial pericardial effusion is present.  5. The right ventricle has normal systolic function. The cavity was normal. There is no increase in right ventricular wall thickness.  6. Normal IVC size with PA systolic pressure 42 mmHg.  7. No evidence of mitral valve stenosis. No significant mitral regurgitation.   Laboratory Data:  High Sensitivity Troponin:   Recent Labs  Lab 04/13/19 0640 04/13/19 0848  TROPONINIHS 40* 99*     Chemistry Recent Labs  Lab 04/13/19 0640  NA 141  K 3.3*  CL 100  CO2 26  GLUCOSE 104*  BUN 18  CREATININE 1.05*  CALCIUM 9.4  GFRNONAA 60*  GFRAA >60  ANIONGAP 15    No results for input(s): PROT, ALBUMIN, AST, ALT, ALKPHOS, BILITOT in the last 168 hours. Hematology Recent Labs  Lab 04/13/19 0640  WBC 11.4*  RBC 4.17  HGB 12.2  HCT 37.3  MCV 89.4  MCH 29.3  MCHC 32.7  RDW 12.8  PLT 239   BNP Recent Labs  Lab 04/13/19 0640  BNP 27.1    DDimer No results for input(s): DDIMER in the last 168 hours.   Radiology/Studies:  CT Angio Chest PE W and/or Wo Contrast  Result Date: 04/13/2019 CLINICAL DATA:  Increased shortness of breath, mid chest pain for 1 day. EXAM: CT ANGIOGRAPHY CHEST WITH CONTRAST TECHNIQUE: Multidetector CT imaging of the chest was performed using the standard protocol during bolus administration of intravenous contrast. Multiplanar CT image reconstructions and MIPs were obtained to evaluate the vascular anatomy. CONTRAST:  75 cc OMNIPAQUE IOHEXOL 350 MG/ML SOLN COMPARISON:  Chest CT dated 09/23/2018. FINDINGS: Cardiovascular: Heart size is normal. No pericardial effusion. No thoracic aortic aneurysm or evidence of aortic dissection. Mediastinum/Nodes: Numerous calcified lymph nodes within the mediastinum and perihilar regions indicating previous granulomatous infection. No enlarged or suspicious lymph nodes. Esophagus is unremarkable. Trachea and central bronchi are unremarkable.  Lungs/Pleura: Mild scarring/atelectasis at the lung bases. No evidence of pneumonia. No pleural effusion or pneumothorax. Again noted are scattered pleural based nodules which are not significantly changed in the interval, some again noted to be partially calcified suggesting sequela of previous granulomatous  infection, silicosis or other pneumoconiosis, sarcoidosis, or less likely sequela of asbestos exposure. Stable emphysematous changes within the upper lobes, mild to moderate in degree. Upper Abdomen: Limited images of the upper abdomen are unremarkable. Musculoskeletal: No acute or suspicious osseous finding. Mild degenerative spondylosis of the scoliotic thoracic spine. Review of the MIP images confirms the above findings. IMPRESSION: 1. No acute findings. No evidence of pneumonia or pulmonary edema. No thoracic aortic aneurysm. 2. Numerous calcified lymph nodes within the mediastinum and perihilar regions, and stable small pleural based nodules within each lung, all of which is compatible with previous granulomatous infection, chronic silicosis or other pneumoconiosis, sarcoidosis or less likely sequela of asbestos exposure. 3. Additional chronic/incidental findings detailed above. Emphysema (ICD10-J43.9). Electronically Signed   By: Franki Cabot M.D.   On: 04/13/2019 11:33   DG Chest Port 1 View  Result Date: 04/13/2019 CLINICAL DATA:  Shortness of breath EXAM: PORTABLE CHEST 1 VIEW COMPARISON:  December 20, 2018 chest radiograph and chest CT September 23, 2018 FINDINGS: Upper lobe pulmonary nodular opacities are better seen on CT but appear stable by radiography. There is no edema or consolidation. Heart size and pulmonary vascularity normal. No adenopathy. Aorta is somewhat tortuous but stable. There is degenerative change in the thoracic spine. IMPRESSION: Stable appearing nodular opacities in the upper lobes, largest on the right. No edema or consolidation. Stable cardiac silhouette. Stable aortic  tortuosity. Electronically Signed   By: Lowella Grip III M.D.   On: 04/13/2019 07:04       HEAR Score (for undifferentiated chest pain):  HEAR Score: 4    Assessment and Plan:   Kathleen Cameron is a 53 y.o. female with a hx of HTN, severe COPD on home O2, palpitations who is being seen today for the evaluation of elevated troponin at the request of Dr. Darl Householder.  1. Chest pain with mildly elevated troponin: reports severe dyspnea this morning, followed by tachycardia and then left sided chest tightness. This was the first time she has experienced chest tightness with her respiratory issues. Given nitroglycerin without significant change in symptoms. HsT 40>>99, and EKG with subtle ST depression. Still with some chest pressure, has nitropaste placed. Difficult situation as symptoms may be related to her dyspnea, and elevated blood pressures. Does have RFs with HTN, and HL. In regards to imaging, would not be a good candidate for lexiscan with respiratory issues, and to tachycardic for coronary CT. May ultimately need cardiac cath.  -- will repeat troponin x1. Further management pending results.  -- will start IV heparin in the interim.   2. Acute on chronic respiratory failure with hypoxia 2/2 to COPD exacerbation: has been following with pulmonology as an outpatient, tried on several different medications.  -- she has received IV solu-medrol while in the ED, further management per primary -- COVID, flu negative  3. HTN: reports compliance with home medications. Is hypertensive while in the ED. Would benefit from BB, but would need cardioselective, consider adding Bisoprolol. On norvasc and lisinopril prior to admission.   4. HL: on statin  5. Hypokalemia: replete per primary  For questions or updates, please contact [place] Please consult [email or web address] for contact info under     Signed, Reino Bellis, NP  04/13/2019 1:40 PM   Patient seen and examined.  Agree with  above documentation.  Kathleen Cameron is a 53 year old woman with history of hypertension, severe obstructive pulmonary disease on 2 L O2 at home who presents with chest pain.  I  saw Kathleen Cameron in clinic on 05/13/2018.  She been referred for evaluation of palpitations.  Echocardiogram showed normal LV systolic function, normal RV function, no significant valvular disease, PASP 42, IVC small/collapsible.  Zio patch was ordered for her palpitations and is pending.  She was in her usual state of health until she woke up this morning with chest pain and shortness of breath.  She describes the pain as pressure over the center of her chest.  Also felt like her heart was racing.  She called EMS and was given nitroglycerin in route.  Initially was requiring 5 L O2 but has been weaned to 3 L.  In the ED, Nitropaste was started.  EKG with new ST depressions in V4-6, II, III, aVF.   High-sensitivity troponin 40 ->99 ->287.  When I saw the patient she reported she was chest pain-free, but did state if she walks out of bed she has recurrence of chest pain. On exam, patient is alert and oriented, regular rate and rhythm, no murmurs, lungs CTAB, no LE edema or JVD.  Presentation is consistent with NSTEMI.  Trend troponins to peak.  Will start aspirin, heparin gtt. She is on rosuvastatin 20 mg daily.  She is currently chest pain-free, can give nitroglycerin as needed for chest pain.  Plan for coronary angiography tomorrow.  [name], MD

## 2019-04-13 NOTE — ED Provider Notes (Signed)
  Physical Exam  BP (!) 142/103 (BP Location: Left Arm)   Pulse (!) 112   Temp 98 F (36.7 C) (Oral)   Resp 19   Ht 5\' 1"  (1.549 m)   Wt 52.6 kg   SpO2 98%   BMI 21.92 kg/m   Physical Exam  ED Course/Procedures     Procedures  MDM  Care assumed at 7 PM from Kathleen Cameron.  Patient does have a history of COPD and is on 2 L nasal cannula at baseline.  Patient is here for worsening shortness of breath .  Signout pending labs including troponins as well as reassessment   9:30 AM Does desat to 89% after taking a few steps on her 2 L nasal cannula.  Her rapid Covid is negative and another Covid test was performed and was negative as well .  She is persistently tachycardic so CTA chest ordered.  First troponin was 40 and delta troponin.  12:05 PM Her Delta trop is now 3. CT showed no PE. Given albuterol, steroids. I think elevated trop likely demand ischemia from COPD. Talked to cardiology, who will see patient and asked me to hold off on heparin. Hospitalist to admit for COPD with hypoxia, elevated Trop.   CRITICAL CARE Performed by: Kathleen Cameron   Total critical care time: 30 minutes  Critical care time was exclusive of separately billable procedures and treating other patients.  Critical care was necessary to treat or prevent imminent or life-threatening deterioration.  Critical care was time spent personally by me on the following activities: development of treatment plan with patient and/or surrogate as well as nursing, discussions with consultants, evaluation of patient's response to treatment, examination of patient, obtaining history from patient or surrogate, ordering and performing treatments and interventions, ordering and review of laboratory studies, ordering and review of radiographic studies, pulse oximetry and re-evaluation of patient's condition.       Kathleen Freeze, MD 04/13/19 [phone]

## 2019-04-14 ENCOUNTER — Inpatient Hospital Stay (HOSPITAL_COMMUNITY): Payer: 59

## 2019-04-14 ENCOUNTER — Encounter (HOSPITAL_COMMUNITY)
Admission: EM | Disposition: A | Discharge status: Home / Self Care | Payer: Self-pay | Source: Home / Self Care | Attending: Internal Medicine

## 2019-04-14 DIAGNOSIS — I214 Non-ST elevation (NSTEMI) myocardial infarction: Secondary | ICD-10-CM

## 2019-04-14 LAB — ECHOCARDIOGRAM COMPLETE
Height: 61 in
Weight: 1844.81 oz

## 2019-04-14 LAB — CBC
HCT: 37.6 % (ref 36.0–46.0)
Hemoglobin: 12.4 g/dL (ref 12.0–15.0)
MCH: 28.7 pg (ref 26.0–34.0)
MCHC: 33 g/dL (ref 30.0–36.0)
MCV: 87 fL (ref 80.0–100.0)
Platelets: 237 10*3/uL (ref 150–400)
RBC: 4.32 MIL/uL (ref 3.87–5.11)
RDW: 12.9 % (ref 11.5–15.5)
WBC: 6.8 10*3/uL (ref 4.0–10.5)
nRBC: 0 % (ref 0.0–0.2)

## 2019-04-14 LAB — BASIC METABOLIC PANEL
Anion gap: 12 (ref 5–15)
BUN: 13 mg/dL (ref 6–20)
CO2: 26 mmol/L (ref 22–32)
Calcium: 10 mg/dL (ref 8.9–10.3)
Chloride: 103 mmol/L (ref 98–111)
Creatinine, Ser: 0.9 mg/dL (ref 0.44–1.00)
GFR calc Af Amer: >60 mL/min (ref >60)
GFR calc non Af Amer: >60 mL/min (ref >60)
Glucose, Bld: 134 mg/dL — ABNORMAL HIGH (ref 70–99)
Potassium: 4.3 mmol/L (ref 3.5–5.1)
Sodium: 141 mmol/L (ref 135–145)

## 2019-04-14 LAB — HEPARIN LEVEL (UNFRACTIONATED)
Heparin Unfractionated: 0.71 IU/mL — ABNORMAL HIGH (ref 0.30–0.70)
Heparin Unfractionated: 0.48 IU/mL (ref 0.30–0.70)
Heparin Unfractionated: 0.31 IU/mL (ref 0.30–0.70)

## 2019-04-14 LAB — TROPONIN I (HIGH SENSITIVITY): Troponin I (High Sensitivity): 299 ng/L (ref <18)

## 2019-04-14 SURGERY — INVASIVE LAB ABORTED CASE

## 2019-04-14 MED ORDER — LIDOCAINE HCL (PF) 1 % IJ SOLN
INTRAMUSCULAR | Status: AC
  Filled 2019-04-14: qty 30

## 2019-04-14 MED ORDER — ENSURE ENLIVE PO LIQD
237.0000 mL | Freq: Two times a day (BID) | ORAL | Status: DC
  Administered 2019-04-14 – 2019-04-17 (×5): 237 mL via ORAL

## 2019-04-14 MED ORDER — LISINOPRIL 20 MG PO TABS
20.0000 mg | ORAL_TABLET | Freq: Every day | ORAL | Status: DC
  Administered 2019-04-14 – 2019-04-17 (×4): 20 mg via ORAL
  Filled 2019-04-14 (×4): qty 1

## 2019-04-14 MED ORDER — METOPROLOL TARTRATE 25 MG PO TABS
25.0000 mg | ORAL_TABLET | Freq: Two times a day (BID) | ORAL | Status: DC
  Administered 2019-04-14 – 2019-04-16 (×5): 25 mg via ORAL
  Filled 2019-04-14 (×5): qty 1

## 2019-04-14 MED ORDER — LISINOPRIL 40 MG PO TABS: 40.0000 mg | ORAL_TABLET | Freq: Every day | ORAL | Status: DC

## 2019-04-14 MED ORDER — ALBUTEROL SULFATE (2.5 MG/3ML) 0.083% IN NEBU
2.5000 mg | INHALATION_SOLUTION | Freq: Two times a day (BID) | RESPIRATORY_TRACT | Status: DC
  Administered 2019-04-14 – 2019-04-17 (×6): 2.5 mg via RESPIRATORY_TRACT
  Filled 2019-04-14 (×6): qty 3

## 2019-04-14 SURGICAL SUPPLY — 4 items
KIT HEART LEFT (KITS) ×3 IMPLANT
PACK CARDIAC CATHETERIZATION (CUSTOM PROCEDURE TRAY) ×3 IMPLANT
TRANSDUCER W/STOPCOCK (MISCELLANEOUS) ×3 IMPLANT
TUBING CIL FLEX 10 FLL-RA (TUBING) ×3 IMPLANT

## 2019-04-14 NOTE — Progress Notes (Addendum)
Progress Note  Patient Name: Kathleen Cameron Date of Encounter: 04/14/2019  Primary Cardiologist: [name], MD   Subjective   High-sensitivity troponin peaked at 372.  Planned coronary angiography today, however patient unable to lie flat due to shortness of breath.  She reports no chest pain at rest.  However she states she is having chest pain with minimal exertion.  States that walking around her room causes chest pain  Inpatient Medications    Scheduled Meds: . albuterol  2.5 mg Nebulization BID  . amLODipine  10 mg Oral Daily  . aspirin EC  81 mg Oral Daily  . azithromycin  500 mg Oral Daily  . hydrochlorothiazide  25 mg Oral Daily  . methylPREDNISolone (SOLU-MEDROL) injection  60 mg Intravenous Q12H  . rosuvastatin  20 mg Oral q1800  . sodium chloride flush  3 mL Intravenous Q12H  . umeclidinium bromide  1 puff Inhalation Daily   Continuous Infusions: . sodium chloride    . sodium chloride 1 mL/kg/hr (04/14/19 0558)  . heparin 600 Units/hr (04/14/19 0024)   PRN Meds: sodium chloride, acetaminophen **OR** acetaminophen, hydrOXYzine, morphine injection, nitroGLYCERIN, ondansetron **OR** ondansetron (ZOFRAN) IV, sodium chloride flush   Vital Signs    Vitals:   04/13/19 2055 04/13/19 2233 04/14/19 0428 04/14/19 0725  BP: 122/89  (!) 152/95   Pulse: (!) 104 97 87 92  Resp: 18  16 16   Temp: 98.4 F (36.9 C)  98.4 F (36.9 C)   TempSrc: Oral  Oral   SpO2: 99%  98% 96%  Weight:   52.3 kg   Height:        Intake/Output Summary (Last 24 hours) at 04/14/2019 0752 Last data filed at 04/14/2019 0558 Gross per 24 hour  Intake 793.54 ml  Output 550 ml  Net 243.54 ml   Last 3 Weights 04/14/2019 04/13/2019 04/06/2019  Weight (lbs) 115 lb 4.8 oz 116 lb 116 lb  Weight (kg) 52.3 kg 52.617 kg 52.617 kg      Telemetry    Sinus rhythm the 90s- Personally Reviewed  ECG    No new EKG- Personally Reviewed  Physical Exam   GEN: No acute  distress.   Neck: No JVD Cardiac: RRR, no murmurs, rubs, or gallops.  Respiratory: Clear to auscultation bilaterally. GI: Soft, nontender, non-distended  MS: No edema; No deformity. Neuro:  Nonfocal  Psych: Normal affect   Labs    High Sensitivity Troponin:   Recent Labs  Lab 04/13/19 0640 04/13/19 0848 04/13/19 1511 04/13/19 2122 04/14/19 0526  TROPONINIHS 40* 99* 287* 372* 299*      Chemistry Recent Labs  Lab 04/13/19 0640 04/14/19 0526  NA 141 141  K 3.3* 4.3  CL 100 103  CO2 26 26  GLUCOSE 104* 134*  BUN 18 13  CREATININE 1.05* 0.90  CALCIUM 9.4 10.0  GFRNONAA 60* >60  GFRAA >60 >60  ANIONGAP 15 12     Hematology Recent Labs  Lab 04/13/19 0640 04/14/19 0526  WBC 11.4* 6.8  RBC 4.17 4.32  HGB 12.2 12.4  HCT 37.3 37.6  MCV 89.4 87.0  MCH 29.3 28.7  MCHC 32.7 33.0  RDW 12.8 12.9  PLT 239 237    BNP Recent Labs  Lab 04/13/19 0640  BNP 27.1     DDimer No results for input(s): DDIMER in the last 168 hours.   Radiology    CT Angio Chest PE W and/or Wo Contrast  Result Date: 04/13/2019 CLINICAL DATA:  Increased shortness of breath, mid chest pain for 1 day. EXAM: CT ANGIOGRAPHY CHEST WITH CONTRAST TECHNIQUE: Multidetector CT imaging of the chest was performed using the standard protocol during bolus administration of intravenous contrast. Multiplanar CT image reconstructions and MIPs were obtained to evaluate the vascular anatomy. CONTRAST:  75 cc OMNIPAQUE IOHEXOL 350 MG/ML SOLN COMPARISON:  Chest CT dated 09/23/2018. FINDINGS: Cardiovascular: Heart size is normal. No pericardial effusion. No thoracic aortic aneurysm or evidence of aortic dissection. Mediastinum/Nodes: Numerous calcified lymph nodes within the mediastinum and perihilar regions indicating previous granulomatous infection. No enlarged or suspicious lymph nodes. Esophagus is unremarkable. Trachea and central bronchi are unremarkable. Lungs/Pleura: Mild scarring/atelectasis at the lung  bases. No evidence of pneumonia. No pleural effusion or pneumothorax. Again noted are scattered pleural based nodules which are not significantly changed in the interval, some again noted to be partially calcified suggesting sequela of previous granulomatous infection, silicosis or other pneumoconiosis, sarcoidosis, or less likely sequela of asbestos exposure. Stable emphysematous changes within the upper lobes, mild to moderate in degree. Upper Abdomen: Limited images of the upper abdomen are unremarkable. Musculoskeletal: No acute or suspicious osseous finding. Mild degenerative spondylosis of the scoliotic thoracic spine. Review of the MIP images confirms the above findings. IMPRESSION: 1. No acute findings. No evidence of pneumonia or pulmonary edema. No thoracic aortic aneurysm. 2. Numerous calcified lymph nodes within the mediastinum and perihilar regions, and stable small pleural based nodules within each lung, all of which is compatible with previous granulomatous infection, chronic silicosis or other pneumoconiosis, sarcoidosis or less likely sequela of asbestos exposure. 3. Additional chronic/incidental findings detailed above. Emphysema (ICD10-J43.9). Electronically Signed   By: Bary RichardStan  Maynard M.D.   On: 04/13/2019 11:33   DG Chest Port 1 View  Result Date: 04/13/2019 CLINICAL DATA:  Shortness of breath EXAM: PORTABLE CHEST 1 VIEW COMPARISON:  December 20, 2018 chest radiograph and chest CT September 23, 2018 FINDINGS: Upper lobe pulmonary nodular opacities are better seen on CT but appear stable by radiography. There is no edema or consolidation. Heart size and pulmonary vascularity normal. No adenopathy. Aorta is somewhat tortuous but stable. There is degenerative change in the thoracic spine. IMPRESSION: Stable appearing nodular opacities in the upper lobes, largest on the right. No edema or consolidation. Stable cardiac silhouette. Stable aortic tortuosity. Electronically Signed   By: Bretta BangWilliam  Woodruff  III M.D.   On: 04/13/2019 07:04    Cardiac Studies   TTE 12/20/18:  1. The left ventricle has hyperdynamic systolic function, with an ejection fraction of >65%. The cavity size was normal. There is mildly increased left ventricular wall thickness. Left ventricular diastolic Doppler parameters are consistent with  impaired relaxation. No evidence of left ventricular regional wall motion abnormalities.  2. The aortic root is normal in size and structure.  3. The aortic valve is tricuspid. No stenosis of the aortic valve.  4. Trivial pericardial effusion is present.  5. The right ventricle has normal systolic function. The cavity was normal. There is no increase in right ventricular wall thickness.  6. Normal IVC size with PA systolic pressure 42 mmHg.  7. No evidence of mitral valve stenosis. No significant mitral regurgitation.  Patient Profile     53 y.o. female with a hx of HTN, severe COPD on home O2, palpitations who is being seen for chest pain/troponin elevation  Assessment & Plan    Chest pain: presented with substernal chest tightness and shortness of breath, pain-free with nitropaste in ED. High-sensitivity troponin peaked  at 372.  EKG with new ST depressions in V4-6, II, III, aVF.  Could represent demand ischemia from respiratory illness vs small type I MI.  Unfortunately not a good candidate for coronary CTA given her heart rate, not a good candidate for [employer] given her COPD, and unable to lie flat for cardiac cath at this time - Recommend medical management for possible NSTEMI for now.  Continue ASA 81 mg daily and would continue IV heparin gtt x 48 hours.  If respiratory status improves and able to lie flat, could consider cath -Would benefit from cardioselective beta-blocker, will start metoprolol 25 mg BID -TTE to valuate for new wall motion abnormality  Acute on chronic respiratory failure with hypoxia 2/2 to COPD exacerbation: has been following with pulmonology as  an outpatient, tried on several different medications.  CTPA on admission was unremarkable.  COVID, flu negative.  On IV solumedrol and azithromycin for COPD exacerbation  HTN: reports compliance with home medications.  On norvasc and lisinopril prior to admission, HCTZ had been discontinued due to hypokalemia.  Will d/c HCTZ and restart home lisinopril.  Will start metoprolol 25 mg BID as above  HLD: on rosuvastatin   For questions or updates, please contact [place] Please consult [email or web address] for contact info under        Signed, Donato Heinz, MD  04/14/2019, 7:52 AM

## 2019-04-14 NOTE — Progress Notes (Signed)
[facility] for Heparin  Indication: chest pain/ACS  Allergies  Allergen Reactions  . Stiolto Respimat [Tiotropium Bromide-Olodaterol] Other (See Comments)    Severe headaches and rapid heartrate    Patient Measurements: Height: 5\' 1"  (154.9 cm) Weight: 116 lb (52.6 kg) IBW/kg (Calculated) : 47.8  Vital Signs: Temp: 98.4 F (36.9 C) (12/23 2055) Temp Source: Oral (12/23 2055) BP: 122/89 (12/23 2055) Pulse Rate: 97 (12/23 2233)  Labs: Recent Labs    04/13/19 0640 04/13/19 0848 04/13/19 1511 04/13/19 2122 04/13/19 2312  HGB 12.2  --   --   --   --   HCT 37.3  --   --   --   --   PLT 239  --   --   --   --   HEPARINUNFRC  --   --   --   --  0.71*  CREATININE 1.05*  --   --   --   --   TROPONINIHS 40* 99* 287* 372*  --     Estimated Creatinine Clearance: 45.7 mL/min (A) (by C-G formula based on SCr of 1.05 mg/dL (H)).   Assessment: 53 y.o. female with a hx of HTN, severe COPD on home O2, palpitations who is being seen today for the evaluation of elevated troponin. Pharmacy consulted for initiation of Heparin for ACS. HL 0.71 units/ml  Goal of Therapy:  Heparin level 0.3-0.7 units/ml Monitor platelets by anticoagulation protocol: Yes   Plan:  Decrease heparin to 600 units/hr Check heparin level ~ 6-8 hours after rate change  Thanks for allowing pharmacy to be a part of this patient's care.  Excell Seltzer, PharmD Clinical Pharmacist

## 2019-04-14 NOTE — Progress Notes (Signed)
Notified MD Rizwan via [place] page that pt was unable to complete procedure and that pt denies chest pain at the moment. Asked if pt was to stay on this floor or if she wanted to move pt to cardiac floor since Troponin level at 299.   Awaiting response.   Paulla Fore, RN, BSN.

## 2019-04-14 NOTE — Plan of Care (Signed)
  Problem: Education: Goal: Knowledge of General Education information will improve Description: Including pain rating scale, medication(s)/side effects and non-pharmacologic comfort measures Outcome: Progressing   Problem: Clinical Measurements: Goal: Diagnostic test results will improve Outcome: Progressing   

## 2019-04-14 NOTE — Progress Notes (Signed)
ANTICOAGULATION CONSULT NOTE  Pharmacy Consult for Heparin  Indication: chest pain/ACS  Patient Measurements: Height: 5\' 1"  (154.9 cm) Weight: 115 lb 4.8 oz (52.3 kg) IBW/kg (Calculated) : 47.8  Heparin weight: 52.3kg  Vital Signs: Temp: 98.4 F (36.9 C) (12/24 0428) Temp Source: Oral (12/24 0428) BP: 152/95 (12/24 0428) Pulse Rate: 92 (12/24 0725)  Labs: Recent Labs    04/13/19 0640 04/13/19 1511 04/13/19 2122 04/13/19 2312 04/14/19 0526 04/14/19 0753  HGB 12.2  --   --   --  12.4  --   HCT 37.3  --   --   --  37.6  --   PLT 239  --   --   --  237  --   HEPARINUNFRC  --   --   --  0.71*  --  0.48  CREATININE 1.05*  --   --   --  0.90  --   TROPONINIHS 40* 287* 372*  --  299*  --     Estimated Creatinine Clearance: 53.3 mL/min (by C-G formula based on SCr of 0.9 mg/dL).   Assessment: 53 y.o. W on heparin for possible ACS. Heart cath cancelled 12/24 due to inability to lie flat.  Pharmacy consulted for initiation of Heparin for ACS.  HL 0.48 units/ml, H/H & plt stable, continue rate.  Goal of Therapy:  Heparin level 0.3-0.7 units/ml Monitor platelets by anticoagulation protocol: Yes   Plan:  Continue heparin 600 units/hr Confirmatory level at 1600  Monitor daily HL, CBC, plt Monitor for signs/symptoms of bleeding   Thanks for allowing pharmacy to be a part of this patient's care.  Benetta Spar, PharmD, BCPS, BCCP Clinical Pharmacist  Please check AMION for all [facility] phone numbers After 10:00 PM, call [place] [phone]

## 2019-04-14 NOTE — Plan of Care (Signed)
  Problem: Education: Goal: Knowledge of General Education information will improve Description: Including pain rating scale, medication(s)/side effects and non-pharmacologic comfort measures Outcome: Progressing   Problem: Activity: Goal: Risk for activity intolerance will decrease Outcome: Not Progressing   

## 2019-04-14 NOTE — Progress Notes (Signed)
Initial Nutrition Assessment  INTERVENTION:   -Ensure Enlive po BID, each supplement provides 350 kcal and 20 grams of protein  NUTRITION DIAGNOSIS:   Increased nutrient needs related to chronic illness(COPD) as evidenced by estimated needs.  GOAL:   Patient will meet greater than or equal to 90% of their needs  MONITOR:   PO intake, Supplement acceptance, Labs, Weight trends, I & O's  REASON FOR ASSESSMENT:   Malnutrition Screening Tool    ASSESSMENT:   53 y.o. female with medical history significant of COPD on 2 L of oxygen at home, hypertension, hyperlipidemia, CKD stage III presents to emergency department due to worsening shortness of breath.  12/23: admitted for acute on chronic respiratory failure with hypoxia 2/2 COPD exacerbation  **RD working remotely**  Patient reports poor appetite given worsened SOB PTA. Pt was made NPO this morning, pt was supposed to have cardiac cath but was unable to lie in supine position d/t SOB. Diet now advanced to heart healthy. No PO yet this admission. Will monitor. Will order Ensure supplements given increased needs from COPD.  Per weight records, no weight loss noted PTA.  I/Os: +243 ml since admit UOP: 550 ml x 24 hrs  Labs reviewed. Medications reviewed.  NUTRITION - FOCUSED PHYSICAL EXAM:  Working remotely.  Diet Order:   Diet Order            Diet Heart Room service appropriate? Yes; Fluid consistency: Thin  Diet effective now              EDUCATION NEEDS:   No education needs have been identified at this time  Skin:  Skin Assessment: Reviewed RN Assessment  Last BM:  PTA  Height:   Ht Readings from Last 1 Encounters:  04/13/19 5\' 1"  (1.549 m)    Weight:   Wt Readings from Last 1 Encounters:  04/14/19 52.3 kg    Ideal Body Weight:  47.7 kg  BMI:  Body mass index is 21.79 kg/m.  Estimated Nutritional Needs:   Kcal:  1550-1750  Protein:  75-85g  Fluid:  1.7L/day  Clayton Bibles, MS, RD,  LDN Inpatient Clinical Dietitian Pager: [phone] After Hours Pager: [phone]

## 2019-04-14 NOTE — Progress Notes (Signed)
Patient brought to the cath lab for planned cardiac cath procedure. She is unable to lie supine for procedure due to SOB even with placing her on a wedge. I don't think we can safely do a heart cath procedure today. Discussed with Dr Gardiner Rhyme. Focus on improving respiratory status.  Natthew Marlatt [place] MD, [facility]

## 2019-04-14 NOTE — Progress Notes (Signed)
PROGRESS NOTE    Kathleen Cameron   NOB:[id number]  DOB: 1965/10/31  DOA: 04/13/2019 PCP: Horald Pollen, MD   Brief Narrative:  Kathleen Cameron is a 53 y.o. female with medical history significant of COPD on 2 L of oxygen at home, hypertension, hyperlipidemia, CKD stage III presents to emergency department due to worsening shortness of breath.  Patient tells me that she has chronic shortness of breath however this morning she was not able to catch her breath, associated with wheezing and intermittent midsternal chest pain since 2 weeks, 6 out of 10, pressure-like, feels like elephant sitting on her chest, nonradiating denies association with leg swelling, palpitation, orthopnea, PND, cough, congestion, fever, chills, nausea, vomiting, diarrhea, abdominal pain, headache or blurry vision.  She lives with her husband and denies smoking, alcohol, illicit drug use.  She tells me she never smoked cigarettes.  Reviewed her PFT from 10/14/2018 which shows severe obstructive pulmonary disease.  Transthoracic echo from 12/20/2018 showed preserved ejection fraction with diastolic dysfunction.  She is compliant with her home inhalers.   Subjective: Still very short of breath. Cough with clear sputum.   Assessment & Plan:   Principal Problem:   Acute on chronic respiratory failure with hypoxia -COVID-19 and influenza PCR is negative -Continue Solu-Medrol, inhalers, nebulizers and a azithromycin  Active Problems:   Elevated troponin with chest pain/ NSTEMI -The patient was started on a heparin infusion-a cardiac cath was attempted however, the patient was not able to lay flat due to dyspnea and therefore it was canceled -Continue heparin infusion for now -Troponin levels are improving    Essential hypertension -Metoprolol lisinopril and amlodipine    Dyslipidemia -Continue Crestor      CKD (chronic kidney disease) The patient's creatinine has improved to 0.90  today    Hypokalemia -Has been adequately replaced   Chest pain   Time spent in minutes: 35 DVT prophylaxis: Heparin infusion Code Status: Full code Family Communication:  Disposition Plan: cont to treat- home when improved Consultants:   cardiology Procedures:   Attempted cath Antimicrobials:  Anti-infectives (From admission, onward)   Start     Dose/Rate Route Frequency Ordered Stop   04/13/19 1300  azithromycin (ZITHROMAX) tablet 500 mg     500 mg Oral Daily 04/13/19 1259 04/16/19 0959       Objective: Vitals:   04/14/19 0428 04/14/19 0725 04/14/19 0859 04/14/19 0945  BP: (!) 152/95   (!) 134/93  Pulse: 87 92  96  Resp: 16 16  18   Temp: 98.4 F (36.9 C)   97.9 F (36.6 C)  TempSrc: Oral   Oral  SpO2: 98% 96% 96% 100%  Weight: 52.3 kg     Height:        Intake/Output Summary (Last 24 hours) at 04/14/2019 1100 Last data filed at 04/14/2019 0558 Gross per 24 hour  Intake 793.54 ml  Output 550 ml  Net 243.54 ml   Filed Weights   04/13/19 0620 04/14/19 0428  Weight: 52.6 kg 52.3 kg    Examination: General exam: Appears comfortable  HEENT: PERRLA, oral mucosa moist, no sclera icterus or thrush Respiratory system: poor air movement. Respiratory effort normal. Cardiovascular system: S1 & S2 heard, RRR.   Gastrointestinal system: Abdomen soft, non-tender, nondistended. Normal bowel sounds. Central nervous system: Alert and oriented. No focal neurological deficits. Extremities: No cyanosis, clubbing or edema Skin: No rashes or ulcers Psychiatry:  Mood & affect appropriate.     Data Reviewed: I have personally  reviewed following labs and imaging studies  CBC: Recent Labs  Lab 04/13/19 0640 04/14/19 0526  WBC 11.4* 6.8  NEUTROABS 9.1*  --   HGB 12.2 12.4  HCT 37.3 37.6  MCV 89.4 87.0  PLT 239 237   Basic Metabolic Panel: Recent Labs  Lab 04/13/19 0640 04/13/19 1511 04/14/19 0526  NA 141  --  141  K 3.3*  --  4.3  CL 100  --  103  CO2 26   --  26  GLUCOSE 104*  --  134*  BUN 18  --  13  CREATININE 1.05*  --  0.90  CALCIUM 9.4  --  10.0  MG  --  1.9  --   PHOS  --  2.8  --    GFR: Estimated Creatinine Clearance: 53.3 mL/min (by C-G formula based on SCr of 0.9 mg/dL). Liver Function Tests: No results for input(s): AST, ALT, ALKPHOS, BILITOT, PROT, ALBUMIN in the last 168 hours. No results for input(s): LIPASE, AMYLASE in the last 168 hours. No results for input(s): AMMONIA in the last 168 hours. Coagulation Profile: No results for input(s): INR, PROTIME in the last 168 hours. Cardiac Enzymes: No results for input(s): CKTOTAL, CKMB, CKMBINDEX, TROPONINI in the last 168 hours. BNP (last 3 results) No results for input(s): PROBNP in the last 8760 hours. HbA1C: No results for input(s): HGBA1C in the last 72 hours. CBG: No results for input(s): GLUCAP in the last 168 hours. Lipid Profile: Recent Labs    04/13/19 0640  CHOL 193  HDL 68  LDLCALC 108*  TRIG 85  CHOLHDL 2.8   Thyroid Function Tests: No results for input(s): TSH, T4TOTAL, FREET4, T3FREE, THYROIDAB in the last 72 hours. Anemia Panel: No results for input(s): VITAMINB12, FOLATE, FERRITIN, TIBC, IRON, RETICCTPCT in the last 72 hours. Urine analysis:    Component Value Date/Time   COLORURINE YELLOW 11/12/2008 1440   APPEARANCEUR CLEAR 11/12/2008 1440   LABSPEC 1.016 11/12/2008 1440   PHURINE 7.5 11/12/2008 1440   GLUCOSEU NEGATIVE 11/12/2008 1440   HGBUR TRACE (A) 11/12/2008 1440   BILIRUBINUR NEGATIVE 11/12/2008 1440   KETONESUR NEGATIVE 11/12/2008 1440   PROTEINUR NEGATIVE 11/12/2008 1440   UROBILINOGEN 1.0 11/12/2008 1440   NITRITE NEGATIVE 11/12/2008 1440   LEUKOCYTESUR SMALL (A) 11/12/2008 1440   Sepsis Labs: (procalcitonin:4,lacticidven:4) ) Recent Results (from the past 240 hour(s))  Respiratory Panel by RT PCR (Flu A&B, Covid) - Nasopharyngeal Swab     Status: None   Collection Time: 04/13/19  9:29 AM   Specimen:  Nasopharyngeal Swab  Result Value Ref Range Status   SARS Coronavirus 2 by RT PCR NEGATIVE NEGATIVE Final    Comment: (NOTE) SARS-CoV-2 target nucleic acids are NOT DETECTED. The SARS-CoV-2 RNA is generally detectable in upper respiratoy specimens during the acute phase of infection. The lowest concentration of SARS-CoV-2 viral copies this assay can detect is 131 copies/mL. A negative result does not preclude SARS-Cov-2 infection and should not be used as the sole basis for treatment or other patient management decisions. A negative result may occur with  improper specimen collection/handling, submission of specimen other than nasopharyngeal swab, presence of viral mutation(s) within the areas targeted by this assay, and inadequate number of viral copies (<131 copies/mL). A negative result must be combined with clinical observations, patient history, and epidemiological information. The expected result is Negative. Fact Sheet for Patients:  https://www.moore.com/ Fact Sheet for Healthcare Providers:  https://www.young.biz/ This test is not yet ap proved or cleared by  the [place] FDA and  has been authorized for detection and/or diagnosis of SARS-CoV-2 by FDA under an Emergency Use Authorization (EUA). This EUA will remain  in effect (meaning this test can be used) for the duration of the COVID-19 declaration under Section 564(b)(1) of the Act, 21 U.S.C. section 360bbb-3(b)(1), unless the authorization is terminated or revoked sooner.    Influenza A by PCR NEGATIVE NEGATIVE Final   Influenza B by PCR NEGATIVE NEGATIVE Final    Comment: (NOTE) The Xpert Xpress SARS-CoV-2/FLU/RSV assay is intended as an aid in  the diagnosis of influenza from Nasopharyngeal swab specimens and  should not be used as a sole basis for treatment. Nasal washings and  aspirates are unacceptable for Xpert Xpress SARS-CoV-2/FLU/RSV  testing. Fact Sheet for  Patients: https://www.moore.com/https://www.fda.gov/media/142436/download Fact Sheet for Healthcare Providers: https://www.young.biz/https://www.fda.gov/media/142435/download This test is not yet approved or cleared by the [place] FDA and  has been authorized for detection and/or diagnosis of SARS-CoV-2 by  FDA under an Emergency Use Authorization (EUA). This EUA will remain  in effect (meaning this test can be used) for the duration of the  Covid-19 declaration under Section 564(b)(1) of the Act, 21  U.S.C. section 360bbb-3(b)(1), unless the authorization is  terminated or revoked. Performed at [facility], [address] [place], [place] [address]   Culture, blood (routine x 2)     Status: None (Preliminary result)   Collection Time: 04/13/19  3:11 PM   Specimen: BLOOD  Result Value Ref Range Status   Specimen Description BLOOD SITE NOT SPECIFIED  Final   Special Requests   Final    BOTTLES DRAWN AEROBIC AND ANAEROBIC Blood Culture adequate volume   Culture   Final    NO GROWTH < 24 HOURS Performed at [facility], [address] [place], [place] [address]    Report Status PENDING  Incomplete  Culture, blood (routine x 2)     Status: None (Preliminary result)   Collection Time: 04/13/19  3:11 PM   Specimen: BLOOD  Result Value Ref Range Status   Specimen Description BLOOD SITE NOT SPECIFIED  Final   Special Requests   Final    BOTTLES DRAWN AEROBIC AND ANAEROBIC Blood Culture adequate volume   Culture   Final    NO GROWTH < 24 HOURS Performed at [facility], [address] [place], [place] [address]    Report Status PENDING  Incomplete         Radiology Studies: CT Angio Chest PE W and/or Wo Contrast  Result Date: 04/13/2019 CLINICAL DATA:  Increased shortness of breath, mid chest pain for 1 day. EXAM: CT ANGIOGRAPHY CHEST WITH CONTRAST TECHNIQUE: Multidetector CT imaging of the chest was performed using the standard protocol during bolus administration of intravenous contrast.  Multiplanar CT image reconstructions and MIPs were obtained to evaluate the vascular anatomy. CONTRAST:  75 cc OMNIPAQUE IOHEXOL 350 MG/ML SOLN COMPARISON:  Chest CT dated 09/23/2018. FINDINGS: Cardiovascular: Heart size is normal. No pericardial effusion. No thoracic aortic aneurysm or evidence of aortic dissection. Mediastinum/Nodes: Numerous calcified lymph nodes within the mediastinum and perihilar regions indicating previous granulomatous infection. No enlarged or suspicious lymph nodes. Esophagus is unremarkable. Trachea and central bronchi are unremarkable. Lungs/Pleura: Mild scarring/atelectasis at the lung bases. No evidence of pneumonia. No pleural effusion or pneumothorax. Again noted are scattered pleural based nodules which are not significantly changed in the interval, some again noted to be partially calcified suggesting sequela of previous  granulomatous infection, silicosis or other pneumoconiosis, sarcoidosis, or less likely sequela of asbestos exposure. Stable emphysematous changes within the upper lobes, mild to moderate in degree. Upper Abdomen: Limited images of the upper abdomen are unremarkable. Musculoskeletal: No acute or suspicious osseous finding. Mild degenerative spondylosis of the scoliotic thoracic spine. Review of the MIP images confirms the above findings. IMPRESSION: 1. No acute findings. No evidence of pneumonia or pulmonary edema. No thoracic aortic aneurysm. 2. Numerous calcified lymph nodes within the mediastinum and perihilar regions, and stable small pleural based nodules within each lung, all of which is compatible with previous granulomatous infection, chronic silicosis or other pneumoconiosis, sarcoidosis or less likely sequela of asbestos exposure. 3. Additional chronic/incidental findings detailed above. Emphysema (ICD10-J43.9). Electronically Signed   By: Bary Richard M.D.   On: 04/13/2019 11:33   DG Chest Port 1 View  Result Date: 04/13/2019 CLINICAL DATA:   Shortness of breath EXAM: PORTABLE CHEST 1 VIEW COMPARISON:  December 20, 2018 chest radiograph and chest CT September 23, 2018 FINDINGS: Upper lobe pulmonary nodular opacities are better seen on CT but appear stable by radiography. There is no edema or consolidation. Heart size and pulmonary vascularity normal. No adenopathy. Aorta is somewhat tortuous but stable. There is degenerative change in the thoracic spine. IMPRESSION: Stable appearing nodular opacities in the upper lobes, largest on the right. No edema or consolidation. Stable cardiac silhouette. Stable aortic tortuosity. Electronically Signed   By: Bretta Bang III M.D.   On: 04/13/2019 07:04      Scheduled Meds: . albuterol  2.5 mg Nebulization BID  . amLODipine  10 mg Oral Daily  . aspirin EC  81 mg Oral Daily  . azithromycin  500 mg Oral Daily  . lisinopril  20 mg Oral Daily  . methylPREDNISolone (SOLU-MEDROL) injection  60 mg Intravenous Q12H  . metoprolol tartrate  25 mg Oral BID  . rosuvastatin  20 mg Oral q1800  . sodium chloride flush  3 mL Intravenous Q12H  . umeclidinium bromide  1 puff Inhalation Daily   Continuous Infusions: . heparin 600 Units/hr (04/14/19 0024)     LOS: 1 day      [name], MD [facility] Pager: [email or web address] Password [email or web address] 04/14/2019, 11:00 AM

## 2019-04-14 NOTE — Progress Notes (Signed)
  Echocardiogram 2D Echocardiogram has been performed.  Kathleen Cameron 04/14/2019, 3:18 PM

## 2019-04-14 NOTE — Progress Notes (Signed)
Notified MD Rizwan via secure chat that pt was asking if she could have a breathing treatment- pt does not have any PRN breathing treatments at this time. Pt stated that she felt short of breath, O2 saturation is 98% on 2L [place]. Awaiting response.  Paulla Fore, RN, BSN .

## 2019-04-15 ENCOUNTER — Encounter (HOSPITAL_COMMUNITY): Payer: Self-pay | Admitting: Internal Medicine

## 2019-04-15 DIAGNOSIS — J441 Chronic obstructive pulmonary disease with (acute) exacerbation: Principal | ICD-10-CM

## 2019-04-15 DIAGNOSIS — I248 Other forms of acute ischemic heart disease: Secondary | ICD-10-CM

## 2019-04-15 DIAGNOSIS — J438 Other emphysema: Secondary | ICD-10-CM

## 2019-04-15 LAB — CBC
HCT: 36.5 % (ref 36.0–46.0)
Hemoglobin: 12.2 g/dL (ref 12.0–15.0)
MCH: 29 pg (ref 26.0–34.0)
MCHC: 33.4 g/dL (ref 30.0–36.0)
MCV: 86.9 fL (ref 80.0–100.0)
Platelets: 269 10*3/uL (ref 150–400)
RBC: 4.2 MIL/uL (ref 3.87–5.11)
RDW: 13.1 % (ref 11.5–15.5)
WBC: 11.5 10*3/uL — ABNORMAL HIGH (ref 4.0–10.5)
nRBC: 0 % (ref 0.0–0.2)

## 2019-04-15 LAB — HEPARIN LEVEL (UNFRACTIONATED): Heparin Unfractionated: 0.52 IU/mL (ref 0.30–0.70)

## 2019-04-15 LAB — TROPONIN I (HIGH SENSITIVITY): Troponin I (High Sensitivity): 74 ng/L — ABNORMAL HIGH (ref <18)

## 2019-04-15 MED ORDER — GUAIFENESIN ER 600 MG PO TB12
600.0000 mg | ORAL_TABLET | Freq: Two times a day (BID) | ORAL | Status: DC
  Administered 2019-04-15 – 2019-04-17 (×5): 600 mg via ORAL
  Filled 2019-04-15 (×5): qty 1

## 2019-04-15 MED ORDER — METHYLPREDNISOLONE SODIUM SUCC 40 MG IJ SOLR
40.0000 mg | Freq: Two times a day (BID) | INTRAMUSCULAR | Status: DC
  Administered 2019-04-15 – 2019-04-16 (×4): 40 mg via INTRAVENOUS
  Filled 2019-04-15 (×4): qty 1

## 2019-04-15 MED ORDER — BISACODYL 5 MG PO TBEC
10.0000 mg | DELAYED_RELEASE_TABLET | Freq: Once | ORAL | Status: AC
  Administered 2019-04-15: 10 mg via ORAL
  Filled 2019-04-15: qty 2

## 2019-04-15 NOTE — Progress Notes (Signed)
PROGRESS NOTE    ARCENIA SCARBRO   LKG:[id number]  DOB: 01-10-1966  DOA: 04/13/2019 PCP: [name], MD   Brief Narrative:  Kathleen Cameron is a 53 y.o. female with medical history significant of COPD on 2 L of oxygen at home, hypertension, hyperlipidemia, CKD stage III presents to emergency department due to worsening shortness of breath.  Patient tells me that she has chronic shortness of breath however this morning she was not able to catch her breath, associated with wheezing and intermittent midsternal chest pain since 2 weeks, 6 out of 10, pressure-like, feels like elephant sitting on her chest, nonradiating denies association with leg swelling, palpitation, orthopnea, PND, cough, congestion, fever, chills, nausea, vomiting, diarrhea, abdominal pain, headache or blurry vision.  She lives with her husband and denies smoking, alcohol, illicit drug use.  She tells me she never smoked cigarettes.  Reviewed her PFT from 10/14/2018 which shows severe obstructive pulmonary disease.  Transthoracic echo from 12/20/2018 showed preserved ejection fraction with diastolic dysfunction.  She is compliant with her home inhalers.   Subjective: Dyspnea improving. Has cough with white mucous this AM. No recurrent chest pain and no new complaints.   Assessment & Plan:   Principal Problem:   Acute on chronic respiratory failure with hypoxia -COVID-19 and influenza PCR is negative -Continue Solu-Medrol, inhalers, nebulizers and a azithromycin - I recommended to her that she have a Neb machine at home- she is in agreement with this  Active Problems:   Elevated troponin with chest pain/ NSTEMI -The patient was started on a heparin infusion-a cardiac cath was attempted however, the patient was not able to lay flat due to dyspnea and therefore it was canceled -Continue heparin infusion per cardiology -Troponin levels are improving    Essential hypertension -Metoprolol,  lisinopril and amlodipine- BP well controlled    Dyslipidemia -Continue Crestor      CKD (chronic kidney disease) The patient's creatinine has improved to 0.90 today    Hypokalemia -Has been adequately replaced    Time spent in minutes: 35 DVT prophylaxis: Heparin infusion Code Status: Full code Family Communication:  Disposition Plan: cont to treat- home when improved Consultants:   cardiology Procedures:   Attempted cath Antimicrobials:  Anti-infectives (From admission, onward)   Start     Dose/Rate Route Frequency Ordered Stop   04/13/19 1300  azithromycin (ZITHROMAX) tablet 500 mg     500 mg Oral Daily 04/13/19 1259 04/15/19 1003       Objective: Vitals:   04/14/19 2154 04/15/19 0515 04/15/19 0721 04/15/19 0916  BP: (!) 122/96 115/72  117/80  Pulse: 90 67  79  Resp: 16 20  18   Temp:  98.1 F (36.7 C)  98.2 F (36.8 C)  TempSrc:  Oral  Oral  SpO2: 97% 98% 97% 100%  Weight:      Height:        Intake/Output Summary (Last 24 hours) at 04/15/2019 1306 Last data filed at 04/15/2019 1230 Gross per 24 hour  Intake 220 ml  Output --  Net 220 ml   Filed Weights   04/13/19 0620 04/14/19 0428  Weight: 52.6 kg 52.3 kg    Examination: General exam: Appears comfortable  HEENT: PERRLA, oral mucosa moist, no sclera icterus or thrush Respiratory system: Clear to auscultation. Respiratory effort normal. Cardiovascular system: S1 & S2 heard,  No murmurs  Gastrointestinal system: Abdomen soft, non-tender, nondistended. Normal bowel sounds   Central nervous system: Alert and oriented. No focal neurological  deficits. Extremities: No cyanosis, clubbing or edema Skin: No rashes or ulcers Psychiatry:  Mood & affect appropriate.    Data Reviewed: I have personally reviewed following labs and imaging studies  CBC: Recent Labs  Lab 04/13/19 0640 04/14/19 0526 04/15/19 0453  WBC 11.4* 6.8 11.5*  NEUTROABS 9.1*  --   --   HGB 12.2 12.4 12.2  HCT 37.3 37.6 36.5   MCV 89.4 87.0 86.9  PLT 239 237 350   Basic Metabolic Panel: Recent Labs  Lab 04/13/19 0640 04/13/19 1511 04/14/19 0526  NA 141  --  141  K 3.3*  --  4.3  CL 100  --  103  CO2 26  --  26  GLUCOSE 104*  --  134*  BUN 18  --  13  CREATININE 1.05*  --  0.90  CALCIUM 9.4  --  10.0  MG  --  1.9  --   PHOS  --  2.8  --    GFR: Estimated Creatinine Clearance: 53.3 mL/min (by C-G formula based on SCr of 0.9 mg/dL). Liver Function Tests: No results for input(s): AST, ALT, ALKPHOS, BILITOT, PROT, ALBUMIN in the last 168 hours. No results for input(s): LIPASE, AMYLASE in the last 168 hours. No results for input(s): AMMONIA in the last 168 hours. Coagulation Profile: No results for input(s): INR, PROTIME in the last 168 hours. Cardiac Enzymes: No results for input(s): CKTOTAL, CKMB, CKMBINDEX, TROPONINI in the last 168 hours. BNP (last 3 results) No results for input(s): PROBNP in the last 8760 hours. HbA1C: No results for input(s): HGBA1C in the last 72 hours. CBG: No results for input(s): GLUCAP in the last 168 hours. Lipid Profile: Recent Labs    04/13/19 0640  CHOL 193  HDL 68  LDLCALC 108*  TRIG 85  CHOLHDL 2.8   Thyroid Function Tests: No results for input(s): TSH, T4TOTAL, FREET4, T3FREE, THYROIDAB in the last 72 hours. Anemia Panel: No results for input(s): VITAMINB12, FOLATE, FERRITIN, TIBC, IRON, RETICCTPCT in the last 72 hours. Urine analysis:    Component Value Date/Time   COLORURINE YELLOW 11/12/2008 1440   APPEARANCEUR CLEAR 11/12/2008 1440   LABSPEC 1.016 11/12/2008 1440   PHURINE 7.5 11/12/2008 1440   GLUCOSEU NEGATIVE 11/12/2008 1440   HGBUR TRACE (A) 11/12/2008 1440   BILIRUBINUR NEGATIVE 11/12/2008 1440   KETONESUR NEGATIVE 11/12/2008 1440   PROTEINUR NEGATIVE 11/12/2008 1440   UROBILINOGEN 1.0 11/12/2008 1440   NITRITE NEGATIVE 11/12/2008 1440   LEUKOCYTESUR SMALL (A) 11/12/2008 1440   Sepsis  Labs: @LABRCNTIP (procalcitonin:4,lacticidven:4) ) Recent Results (from the past 240 hour(s))  Respiratory Panel by RT PCR (Flu A&B, Covid) - Nasopharyngeal Swab     Status: None   Collection Time: 04/13/19  9:29 AM   Specimen: Nasopharyngeal Swab  Result Value Ref Range Status   SARS Coronavirus 2 by RT PCR NEGATIVE NEGATIVE Final    Comment: (NOTE) SARS-CoV-2 target nucleic acids are NOT DETECTED. The SARS-CoV-2 RNA is generally detectable in upper respiratoy specimens during the acute phase of infection. The lowest concentration of SARS-CoV-2 viral copies this assay can detect is 131 copies/mL. A negative result does not preclude SARS-Cov-2 infection and should not be used as the sole basis for treatment or other patient management decisions. A negative result may occur with  improper specimen collection/handling, submission of specimen other than nasopharyngeal swab, presence of viral mutation(s) within the areas targeted by this assay, and inadequate number of viral copies (<131 copies/mL). A negative result must be combined  with clinical observations, patient history, and epidemiological information. The expected result is Negative. Fact Sheet for Patients:  https://www.moore.com/https://www.fda.gov/media/142436/download Fact Sheet for Healthcare Providers:  https://www.young.biz/https://www.fda.gov/media/142435/download This test is not yet ap proved or cleared by the [place] FDA and  has been authorized for detection and/or diagnosis of SARS-CoV-2 by FDA under an Emergency Use Authorization (EUA). This EUA will remain  in effect (meaning this test can be used) for the duration of the COVID-19 declaration under Section 564(b)(1) of the Act, 21 U.S.C. section 360bbb-3(b)(1), unless the authorization is terminated or revoked sooner.    Influenza A by PCR NEGATIVE NEGATIVE Final   Influenza B by PCR NEGATIVE NEGATIVE Final    Comment: (NOTE) The Xpert Xpress SARS-CoV-2/FLU/RSV assay is intended as an aid in  the  diagnosis of influenza from Nasopharyngeal swab specimens and  should not be used as a sole basis for treatment. Nasal washings and  aspirates are unacceptable for Xpert Xpress SARS-CoV-2/FLU/RSV  testing. Fact Sheet for Patients: https://www.moore.com/https://www.fda.gov/media/142436/download Fact Sheet for Healthcare Providers: https://www.young.biz/https://www.fda.gov/media/142435/download This test is not yet approved or cleared by the [place] FDA and  has been authorized for detection and/or diagnosis of SARS-CoV-2 by  FDA under an Emergency Use Authorization (EUA). This EUA will remain  in effect (meaning this test can be used) for the duration of the  Covid-19 declaration under Section 564(b)(1) of the Act, 21  U.S.C. section 360bbb-3(b)(1), unless the authorization is  terminated or revoked. Performed at [facility], [address] [place], [place] [address]   Culture, blood (routine x 2)     Status: None (Preliminary result)   Collection Time: 04/13/19  3:11 PM   Specimen: BLOOD  Result Value Ref Range Status   Specimen Description BLOOD SITE NOT SPECIFIED  Final   Special Requests   Final    BOTTLES DRAWN AEROBIC AND ANAEROBIC Blood Culture adequate volume   Culture   Final    NO GROWTH 2 DAYS Performed at [facility], [address] [place], [place] [address]    Report Status PENDING  Incomplete  Culture, blood (routine x 2)     Status: None (Preliminary result)   Collection Time: 04/13/19  3:11 PM   Specimen: BLOOD  Result Value Ref Range Status   Specimen Description BLOOD SITE NOT SPECIFIED  Final   Special Requests   Final    BOTTLES DRAWN AEROBIC AND ANAEROBIC Blood Culture adequate volume   Culture   Final    NO GROWTH 2 DAYS Performed at [facility], [address] [place], [place] [address]    Report Status PENDING  Incomplete         Radiology Studies: ECHOCARDIOGRAM COMPLETE  Result Date: 04/14/2019   ECHOCARDIOGRAM REPORT   Patient Name:   Vonzell SchlatterJACQUELINE B  Shenberger Date of Exam: 04/14/2019 Medical Rec #:  [id number]              Height:       61.0 in Accession #:    [id number]             Weight:       115.3 lb Date of Birth:  01/05/1966               BSA:          1.49 m Patient Age:    53 years               BP:  134/93 mmHg Patient Gender: F                      HR:           84 bpm. Exam Location:  Inpatient Procedure: 2D Echo, Cardiac Doppler and Color Doppler Indications:    NSTEMI I21.4  History:        Patient has prior history of Echocardiogram examinations, most                 recent 12/20/2018. COPD, Signs/Symptoms:Chest Pain; Risk                 Factors:Hypertension, Dyslipidemia and Non-Smoker. Elevated                 troponin.  Sonographer:    Tonia Ghent RDCS Referring Phys: [id number] Tanna Savoy [facility]  Sonographer Comments: Technically difficult study due to poor echo windows. Image acquisition challenging due to respiratory motion. Patient unable to lay on back or on side. IMPRESSIONS  1. Left ventricular ejection fraction, by visual estimation, is 60 to 65%. The left ventricle has normal function. There is no left ventricular hypertrophy.  2. Left ventricular diastolic parameters are consistent with Grade I diastolic dysfunction (impaired relaxation).  3. The left ventricle has no regional wall motion abnormalities.  4. Global right ventricle has normal systolic function.The right ventricular size is normal. No increase in right ventricular wall thickness.  5. Left atrial size was normal.  6. Right atrial size was normal.  7. The mitral valve is normal in structure. No evidence of mitral valve regurgitation. No evidence of mitral stenosis.  8. The tricuspid valve is normal in structure.  9. The aortic valve is normal in structure. Aortic valve regurgitation is not visualized. No evidence of aortic valve sclerosis or stenosis. 10. The pulmonic valve was normal in structure. Pulmonic valve regurgitation is not visualized. 11. TR signal  is inadequate for assessing pulmonary artery systolic pressure. 12. The inferior vena cava is normal in size with greater than 50% respiratory variability, suggesting right atrial pressure of 3 mmHg. FINDINGS  Left Ventricle: Left ventricular ejection fraction, by visual estimation, is 60 to 65%. The left ventricle has normal function. The left ventricle has no regional wall motion abnormalities. There is no left ventricular hypertrophy. Left ventricular diastolic parameters are consistent with Grade I diastolic dysfunction (impaired relaxation). Normal left atrial pressure. Right Ventricle: The right ventricular size is normal. No increase in right ventricular wall thickness. Global RV systolic function is has normal systolic function. Left Atrium: Left atrial size was normal in size. Right Atrium: Right atrial size was normal in size Pericardium: There is no evidence of pericardial effusion. Mitral Valve: The mitral valve is normal in structure. No evidence of mitral valve regurgitation. No evidence of mitral valve stenosis by observation. Tricuspid Valve: The tricuspid valve is normal in structure. Tricuspid valve regurgitation is not demonstrated. Aortic Valve: The aortic valve is normal in structure. Aortic valve regurgitation is not visualized. The aortic valve is structurally normal, with no evidence of sclerosis or stenosis. Pulmonic Valve: The pulmonic valve was normal in structure. Pulmonic valve regurgitation is not visualized. Pulmonic regurgitation is not visualized. Aorta: The aortic root, ascending aorta and aortic arch are all structurally normal, with no evidence of dilitation or obstruction. Venous: The inferior vena cava is normal in size with greater than 50% respiratory variability, suggesting right atrial pressure of 3 mmHg. IAS/Shunts: No atrial level shunt detected by  color flow Doppler. There is no evidence of a patent foramen ovale. No ventricular septal defect is seen or detected. There is  no evidence of an atrial septal defect.  LEFT VENTRICLE PLAX 2D LVIDd:         4.49 cm  Diastology LVIDs:         3.29 cm  LV e' lateral:   5.78 cm/s LV PW:         0.68 cm  LV E/e' lateral: 8.2 LV IVS:        0.68 cm  LV e' medial:    4.20 cm/s LVOT diam:     1.70 cm  LV E/e' medial:  11.3 LV SV:         48 ml LV SV Index:   32.01 LVOT Area:     2.27 cm  RIGHT VENTRICLE RV S prime:     6.08 cm/s TAPSE (M-mode): 1.6 cm LEFT ATRIUM             Index      RIGHT ATRIUM          Index LA diam:        2.10 cm 1.41 cm/m RA Area:     5.93 cm LA Vol (A2C):   8.4 ml  5.64 ml/m RA Volume:   10.30 ml 6.89 ml/m LA Vol (A4C):   9.1 ml  6.09 ml/m LA Biplane Vol: 9.3 ml  6.24 ml/m  AORTIC VALVE LVOT Vmax:   68.50 cm/s LVOT Vmean:  46.100 cm/s LVOT VTI:    0.114 m  AORTA Ao Root diam: 2.60 cm MITRAL VALVE MV Area (PHT): 2.16 cm             SHUNTS MV PHT:        102.08 msec          Systemic VTI:  0.11 m MV Decel Time: 352 msec             Systemic Diam: 1.70 cm MV E velocity: 47.60 cm/s 103 cm/s MV A velocity: 76.30 cm/s 70.3 cm/s MV E/A ratio:  0.62       1.5  Mihai Croitoru MD Electronically signed by Thurmon Fair MD Signature Date/Time: 04/14/2019/3:25:11 PM    Final       Scheduled Meds: . albuterol  2.5 mg Nebulization BID  . amLODipine  10 mg Oral Daily  . aspirin EC  81 mg Oral Daily  . feeding supplement (ENSURE ENLIVE)  237 mL Oral BID BM  . guaiFENesin  600 mg Oral BID  . lisinopril  20 mg Oral Daily  . methylPREDNISolone (SOLU-MEDROL) injection  40 mg Intravenous Q12H  . metoprolol tartrate  25 mg Oral BID  . rosuvastatin  20 mg Oral q1800  . sodium chloride flush  3 mL Intravenous Q12H  . umeclidinium bromide  1 puff Inhalation Daily   Continuous Infusions: . heparin 600 Units/hr (04/15/19 0051)     LOS: 2 days      [name], MD [facility] Pager: [email or web address] Password [email or web address] 04/15/2019, 1:06 PM

## 2019-04-15 NOTE — Progress Notes (Signed)
Progress Note  Patient Name: Kathleen Cameron Date of Encounter: 04/15/2019  Primary Cardiologist: [place] Ishikawah[place]opher L Schumann, MD   Subjective   Yesterday was unable to perform cardiac catheterization secondary to inability to lay flat because of shortness of breath.  Currently sitting upright in bed, eating banana.  She states that any exertional activity is quite taxing to her.  Her breathing is a little bit better than yesterday however.  Still not at baseline.  Never smoked before.  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  She was around secondhand smoke however at home.  Inpatient Medications    Scheduled Meds: . albuterol  2.5 mg Nebulization BID  . amLODipine  10 mg Oral Daily  . aspirin EC  81 mg Oral Daily  . azithromycin  500 mg Oral Daily  . feeding supplement (ENSURE ENLIVE)  237 mL Oral BID BM  . lisinopril  20 mg Oral Daily  . methylPREDNISolone (SOLU-MEDROL) injection  60 mg Intravenous Q12H  . metoprolol tartrate  25 mg Oral BID  . rosuvastatin  20 mg Oral q1800  . sodium chloride flush  3 mL Intravenous Q12H  . umeclidinium bromide  1 puff Inhalation Daily   Continuous Infusions: . heparin 600 Units/hr (04/15/19 0051)   PRN Meds: acetaminophen **OR** acetaminophen, hydrOXYzine, morphine injection, nitroGLYCERIN, ondansetron **OR** ondansetron (ZOFRAN) IV   Vital Signs    Vitals:   04/14/19 1938 04/14/19 2154 04/15/19 0515 04/15/19 0721  BP:  (!) 122/96 115/72   Pulse:  90 67   Resp:  16 20   Temp:   98.1 F (36.7 C)   TempSrc:   Oral   SpO2: 98% 97% 98% 97%  Weight:      Height:        Intake/Output Summary (Last 24 hours) at 04/15/2019 0735 Last data filed at 04/14/2019 1253 Gross per 24 hour  Intake 200 ml  Output --  Net 200 ml   Last 3 Weights 04/14/2019 04/13/2019 04/06/2019  Weight (lbs) 115 lb 4.8 oz 116 lb 116 lb  Weight (kg) 52.3 kg 52.617 kg 52.617 kg      Telemetry    Sinus rhythm heart rate in the 90s- Personally  Reviewed  ECG    04/13/2019-sinus tachycardia 110- Personally Reviewed  Physical Exam   GEN: No acute distress.   Neck: No JVD Cardiac: RRR, no murmurs, rubs, or gallops.  Respiratory:  Tight airways with scattered wheezes bilaterally. GI: Soft, nontender, non-distended  MS: No edema; No deformity. Neuro:  Nonfocal  Psych: Normal affect   Labs    High Sensitivity Troponin:   Recent Labs  Lab 04/13/19 0640 04/13/19 0848 04/13/19 1511 04/13/19 2122 04/14/19 0526  TROPONINIHS 40* 99* 287* 372* 299*      Chemistry Recent Labs  Lab 04/13/19 0640 04/14/19 0526  NA 141 141  K 3.3* 4.3  CL 100 103  CO2 26 26  GLUCOSE 104* 134*  BUN 18 13  CREATININE 1.05* 0.90  CALCIUM 9.4 10.0  GFRNONAA 60* >60  GFRAA >60 >60  ANIONGAP 15 12     Hematology Recent Labs  Lab 04/13/19 0640 04/14/19 0526 04/15/19 0453  WBC 11.4* 6.8 11.5*  RBC 4.17 4.32 4.20  HGB 12.2 12.4 12.2  HCT 37.3 37.6 36.5  MCV 89.4 87.0 86.9  MCH 29.3 28.7 29.0  MCHC 32.7 33.0 33.4  RDW 12.8 12.9 13.1  PLT 239 237 269    BNP Recent Labs  Lab 04/13/19  0640  BNP 27.1     DDimer No results for input(s): DDIMER in the last 168 hours.   Radiology    CT Angio Chest PE W and/or Wo Contrast  Result Date: 04/13/2019 CLINICAL DATA:  Increased shortness of breath, mid chest pain for 1 day. EXAM: CT ANGIOGRAPHY CHEST WITH CONTRAST TECHNIQUE: Multidetector CT imaging of the chest was performed using the standard protocol during bolus administration of intravenous contrast. Multiplanar CT image reconstructions and MIPs were obtained to evaluate the vascular anatomy. CONTRAST:  75 cc OMNIPAQUE IOHEXOL 350 MG/ML SOLN COMPARISON:  Chest CT dated 09/23/2018. FINDINGS: Cardiovascular: Heart size is normal. No pericardial effusion. No thoracic aortic aneurysm or evidence of aortic dissection. Mediastinum/Nodes: Numerous calcified lymph nodes within the mediastinum and perihilar regions indicating previous  granulomatous infection. No enlarged or suspicious lymph nodes. Esophagus is unremarkable. Trachea and central bronchi are unremarkable. Lungs/Pleura: Mild scarring/atelectasis at the lung bases. No evidence of pneumonia. No pleural effusion or pneumothorax. Again noted are scattered pleural based nodules which are not significantly changed in the interval, some again noted to be partially calcified suggesting sequela of previous granulomatous infection, silicosis or other pneumoconiosis, sarcoidosis, or less likely sequela of asbestos exposure. Stable emphysematous changes within the upper lobes, mild to moderate in degree. Upper Abdomen: Limited images of the upper abdomen are unremarkable. Musculoskeletal: No acute or suspicious osseous finding. Mild degenerative spondylosis of the scoliotic thoracic spine. Review of the MIP images confirms the above findings. IMPRESSION: 1. No acute findings. No evidence of pneumonia or pulmonary edema. No thoracic aortic aneurysm. 2. Numerous calcified lymph nodes within the mediastinum and perihilar regions, and stable small pleural based nodules within each lung, all of which is compatible with previous granulomatous infection, chronic silicosis or other pneumoconiosis, sarcoidosis or less likely sequela of asbestos exposure. 3. Additional chronic/incidental findings detailed above. Emphysema (ICD10-J43.9). Electronically Signed   By: Franki Cabot M.D.   On: 04/13/2019 11:33   ECHOCARDIOGRAM COMPLETE  Result Date: 04/14/2019   ECHOCARDIOGRAM REPORT   Patient Name:   Kathleen Cameron Date of Exam: 04/14/2019 Medical Rec #:  [id number]              Height:       61.0 in Accession #:    [id number]             Weight:       115.3 lb Date of Birth:  [birth year]               BSA:          1.49 m Patient Age:    53 years               BP:           134/93 mmHg Patient Gender: F                      HR:           84 bpm. Exam Location:  Inpatient Procedure: 2D Echo, Cardiac  Doppler and Color Doppler Indications:    NSTEMI I21.4  History:        Patient has prior history of Echocardiogram examinations, most                 recent 12/20/2018. COPD, Signs/Symptoms:Chest Pain; Risk                 Factors:Hypertension, Dyslipidemia and Non-Smoker. Elevated  troponin.  Sonographer:    Tonia Ghent RDCS Referring Phys: [id number] [facility]  Sonographer Comments: Technically difficult study due to poor echo windows. Image acquisition challenging due to respiratory motion. Patient unable to lay on back or on side. IMPRESSIONS  1. Left ventricular ejection fraction, by visual estimation, is 60 to 65%. The left ventricle has normal function. There is no left ventricular hypertrophy.  2. Left ventricular diastolic parameters are consistent with Grade I diastolic dysfunction (impaired relaxation).  3. The left ventricle has no regional wall motion abnormalities.  4. Global right ventricle has normal systolic function.The right ventricular size is normal. No increase in right ventricular wall thickness.  5. Left atrial size was normal.  6. Right atrial size was normal.  7. The mitral valve is normal in structure. No evidence of mitral valve regurgitation. No evidence of mitral stenosis.  8. The tricuspid valve is normal in structure.  9. The aortic valve is normal in structure. Aortic valve regurgitation is not visualized. No evidence of aortic valve sclerosis or stenosis. 10. The pulmonic valve was normal in structure. Pulmonic valve regurgitation is not visualized. 11. TR signal is inadequate for assessing pulmonary artery systolic pressure. 12. The inferior vena cava is normal in size with greater than 50% respiratory variability, suggesting right atrial pressure of 3 mmHg. FINDINGS  Left Ventricle: Left ventricular ejection fraction, by visual estimation, is 60 to 65%. The left ventricle has normal function. The left ventricle has no regional wall motion  abnormalities. There is no left ventricular hypertrophy. Left ventricular diastolic parameters are consistent with Grade I diastolic dysfunction (impaired relaxation). Normal left atrial pressure. Right Ventricle: The right ventricular size is normal. No increase in right ventricular wall thickness. Global RV systolic function is has normal systolic function. Left Atrium: Left atrial size was normal in size. Right Atrium: Right atrial size was normal in size Pericardium: There is no evidence of pericardial effusion. Mitral Valve: The mitral valve is normal in structure. No evidence of mitral valve regurgitation. No evidence of mitral valve stenosis by observation. Tricuspid Valve: The tricuspid valve is normal in structure. Tricuspid valve regurgitation is not demonstrated. Aortic Valve: The aortic valve is normal in structure. Aortic valve regurgitation is not visualized. The aortic valve is structurally normal, with no evidence of sclerosis or stenosis. Pulmonic Valve: The pulmonic valve was normal in structure. Pulmonic valve regurgitation is not visualized. Pulmonic regurgitation is not visualized. Aorta: The aortic root, ascending aorta and aortic arch are all structurally normal, with no evidence of dilitation or obstruction. Venous: The inferior vena cava is normal in size with greater than 50% respiratory variability, suggesting right atrial pressure of 3 mmHg. IAS/Shunts: No atrial level shunt detected by color flow Doppler. There is no evidence of a patent foramen ovale. No ventricular septal defect is seen or detected. There is no evidence of an atrial septal defect.  LEFT VENTRICLE PLAX 2D LVIDd:         4.49 cm  Diastology LVIDs:         3.29 cm  LV e' lateral:   5.78 cm/s LV PW:         0.68 cm  LV E/e' lateral: 8.2 LV IVS:        0.68 cm  LV e' medial:    4.20 cm/s LVOT diam:     1.70 cm  LV E/e' medial:  11.3 LV SV:         48 ml LV SV Index:  32.01 LVOT Area:     2.27 cm  RIGHT VENTRICLE RV S  prime:     6.08 cm/s TAPSE (M-mode): 1.6 cm LEFT ATRIUM             Index      RIGHT ATRIUM          Index LA diam:        2.10 cm 1.41 cm/m RA Area:     5.93 cm LA Vol (A2C):   8.4 ml  5.64 ml/m RA Volume:   10.30 ml 6.89 ml/m LA Vol (A4C):   9.1 ml  6.09 ml/m LA Biplane Vol: 9.3 ml  6.24 ml/m  AORTIC VALVE LVOT Vmax:   68.50 cm/s LVOT Vmean:  46.100 cm/s LVOT VTI:    0.114 m  AORTA Ao Root diam: 2.60 cm MITRAL VALVE MV Area (PHT): 2.16 cm             SHUNTS MV PHT:        102.08 msec          Systemic VTI:  0.11 m MV Decel Time: 352 msec             Systemic Diam: 1.70 cm MV E velocity: 47.60 cm/s 103 cm/s MV A velocity: 76.30 cm/s 70.3 cm/s MV E/A ratio:  0.62       1.5  Mihai Croitoru MD Electronically signed by Thurmon Fair MD Signature Date/Time: 04/14/2019/3:25:11 PM    Final     Cardiac Studies   Echo 04/14/2019-EF 65% grade 1 diastolic dysfunction otherwise normal.  PFTs 10/14/2018-severe air flow obstruction, severe COPD, FEV1 0.56-no change with bronchodilator  Patient Profile     53 y.o. female with severe COPD, FEV1 0.56 with no significant change with bronchodilation, normal ejection fraction grade 1 diastolic dysfunction, elevated high-sensitivity troponin, peaking 372, decreasing here with shortness of breath, chest discomfort  Assessment & Plan    Chest discomfort with elevated high-sensitivity troponin of 372 trending downward -Could be myocardial injury in the setting of COPD exacerbation.  Catheterization yesterday was canceled secondary to inability to breathe on cath table.  Heart rate too fast for coronary CT.  Continue current medical management, including heparin IV through today.  Tomorrow okay to discontinue heparin. -Chest discomfort can occur in the setting of COPD exacerbation as well.  CT scan negative for pulmonary embolism. -Continue with high intensity statin. -Consider coronary angiography once COPD exacerbation has significantly improved.  COPD  exacerbation, acute on chronic respiratory failure -IV Solu-Medrol, nebs, Covid negative, flu negative -Severe airway obstruction 0.56 FEV1 -She was wondering if she could possibly get a nebulizer at home upon discharge.  Essential hypertension -Difficulty controlling at home, admits to some noncompliance with medications.  Currently well controlled with amlodipine 10, lisinopril 20, metoprolol tartrate 25 twice daily (consider bisoprolol)      For questions or updates, please contact [facility] Please consult [email or web address] for contact info under        Signed, Donato Schultz, MD  04/15/2019, 7:35 AM

## 2019-04-15 NOTE — Progress Notes (Signed)
ANTICOAGULATION CONSULT NOTE  Pharmacy Consult for Heparin  Indication: chest pain/ACS  Patient Measurements: Height: 5\' 1"  (154.9 cm) Weight: 115 lb 4.8 oz (52.3 kg) IBW/kg (Calculated) : 47.8  Heparin weight: 52.3kg  Vital Signs: Temp: 98.2 F (36.8 C) (12/25 0916) Temp Source: Oral (12/25 0916) BP: 117/80 (12/25 0916) Pulse Rate: 79 (12/25 0916)  Labs: Recent Labs    04/13/19 0640 04/13/19 0848 04/13/19 1511 04/13/19 2122 04/14/19 0526 04/14/19 0753 04/14/19 1626 04/15/19 0453  HGB 12.2  --   --   --  12.4  --   --  12.2  HCT 37.3  --   --   --  37.6  --   --  36.5  PLT 239  --   --   --  237  --   --  269  HEPARINUNFRC  --    < >  --   --   --  0.48 0.31 0.52  CREATININE 1.05*  --   --   --  0.90  --   --   --   TROPONINIHS 40*  --  287* 372* 299*  --   --   --    < > = values in this interval not displayed.    Estimated Creatinine Clearance: 53.3 mL/min (by C-G formula based on SCr of 0.9 mg/dL).   Assessment: 53 y.o. W on heparin for possible ACS. Heart cath cancelled 12/24 due to inability to lie flat.  Pharmacy consulted for Heparin for ACS.  HL 0.52 units/ml, H/H & plt stable, continue rate.  Goal of Therapy:  Heparin level 0.3-0.7 units/ml Monitor platelets by anticoagulation protocol: Yes   Plan:  Continue heparin 600 units/hr Monitor daily HL, CBC, plt Monitor for signs/symptoms of bleeding   Thanks for allowing pharmacy to be a part of this patient's care.  Acey Lav, PharmD  PGY1 Acute Care Pharmacy Resident [phone]  Please check AMION for all [place] phone numbers After 10:00 PM, call [place] [phone]

## 2019-04-16 DIAGNOSIS — R778 Other specified abnormalities of plasma proteins: Secondary | ICD-10-CM

## 2019-04-16 LAB — HEPARIN LEVEL (UNFRACTIONATED): Heparin Unfractionated: 0.42 IU/mL (ref 0.30–0.70)

## 2019-04-16 LAB — CBC
HCT: 35.9 % — ABNORMAL LOW (ref 36.0–46.0)
Hemoglobin: 11.8 g/dL — ABNORMAL LOW (ref 12.0–15.0)
MCH: 29.1 pg (ref 26.0–34.0)
MCHC: 32.9 g/dL (ref 30.0–36.0)
MCV: 88.6 fL (ref 80.0–100.0)
Platelets: 280 10*3/uL (ref 150–400)
RBC: 4.05 MIL/uL (ref 3.87–5.11)
RDW: 13.2 % (ref 11.5–15.5)
WBC: 10.6 10*3/uL — ABNORMAL HIGH (ref 4.0–10.5)
nRBC: 0 % (ref 0.0–0.2)

## 2019-04-16 MED ORDER — POLYETHYLENE GLYCOL 3350 17 G PO PACK
17.0000 g | PACK | Freq: Every day | ORAL | Status: DC
  Administered 2019-04-16 – 2019-04-17 (×2): 17 g via ORAL
  Filled 2019-04-16 (×2): qty 1

## 2019-04-16 MED ORDER — FAMOTIDINE 20 MG PO TABS
20.0000 mg | ORAL_TABLET | Freq: Two times a day (BID) | ORAL | Status: DC
  Administered 2019-04-16 – 2019-04-17 (×3): 20 mg via ORAL
  Filled 2019-04-16 (×3): qty 1

## 2019-04-16 MED ORDER — BISOPROLOL FUMARATE 5 MG PO TABS
10.0000 mg | ORAL_TABLET | Freq: Every day | ORAL | Status: DC
  Administered 2019-04-16 – 2019-04-17 (×2): 10 mg via ORAL
  Filled 2019-04-16 (×2): qty 2

## 2019-04-16 NOTE — Plan of Care (Signed)
  Problem: Clinical Measurements: Goal: Ability to maintain clinical measurements within normal limits will improve Outcome: Progressing   

## 2019-04-16 NOTE — Progress Notes (Signed)
ANTICOAGULATION CONSULT NOTE  Pharmacy Consult for Heparin  Indication: chest pain/ACS  Patient Measurements: Height: 5\' 1"  (154.9 cm) Weight: 115 lb 4.8 oz (52.3 kg) IBW/kg (Calculated) : 47.8  Heparin weight: 52.3kg  Vital Signs: Temp: 97.9 F (36.6 C) (12/26 0533) Temp Source: Oral (12/26 0533) BP: 130/83 (12/26 0533) Pulse Rate: 60 (12/26 0533)  Labs: Recent Labs    04/13/19 2122 04/13/19 2312 04/14/19 0526 04/14/19 1626 04/15/19 0453 04/15/19 1500 04/16/19 0536  HGB  --    < > 12.4  --  12.2  --  11.8*  HCT  --   --  37.6  --  36.5  --  35.9*  PLT  --   --  237  --  269  --  280  HEPARINUNFRC  --   --   --  0.31 0.52  --  0.42  CREATININE  --   --  0.90  --   --   --   --   TROPONINIHS 372*  --  299*  --   --  74*  --    < > = values in this interval not displayed.    Estimated Creatinine Clearance: 53.3 mL/min (by C-G formula based on SCr of 0.9 mg/dL).   Assessment: 53 y.o. W on heparin for possible ACS. Heart cath cancelled 12/24 due to inability to lie flat.  Pharmacy consulted for Heparin for ACS.  HL 0.42 units/ml (within goal), H/H & plt stable. RN reports, small amount of bleeding this morning at her IV site. Was able to manage bleeding. Instructed the nurse to monitor the site and if they bleeding becomes unmanageable or significant to let us know immediately.   Goal of Therapy:  Heparin level 0.3-0.7 units/ml Monitor platelets by anticoagulation protocol: Yes   Plan:  Continue heparin 600 units/hr Monitor daily HL, CBC, plt Monitor for signs/symptoms of bleeding   Thanks for allowing pharmacy to be a part of this patient's care.  Acey Lav, PharmD  PGY1 Acute Care Pharmacy Resident [phone]  Please check AMION for all [place] phone numbers After 10:00 PM, call [place] [phone]

## 2019-04-16 NOTE — Progress Notes (Signed)
Progress Note  Patient Name: DAWNIELLE CHRISTIANA Date of Encounter: 04/16/2019  Primary Cardiologist: Donato Heinz, MD   Subjective   Still reports difficulty when she tries to lie flat, heart rate accelerates and this makes her chest hurt. Has early satiety. Able to walk to the bathroom with mild shortness of breath.  Inpatient Medications    Scheduled Meds: . albuterol  2.5 mg Nebulization BID  . amLODipine  10 mg Oral Daily  . aspirin EC  81 mg Oral Daily  . famotidine  20 mg Oral BID  . feeding supplement (ENSURE ENLIVE)  237 mL Oral BID BM  . guaiFENesin  600 mg Oral BID  . lisinopril  20 mg Oral Daily  . methylPREDNISolone (SOLU-MEDROL) injection  40 mg Intravenous Q12H  . metoprolol tartrate  25 mg Oral BID  . polyethylene glycol  17 g Oral Daily  . rosuvastatin  20 mg Oral q1800  . sodium chloride flush  3 mL Intravenous Q12H  . umeclidinium bromide  1 puff Inhalation Daily   Continuous Infusions: . heparin 600 Units/hr (04/15/19 0051)   PRN Meds: acetaminophen **OR** acetaminophen, hydrOXYzine, morphine injection, nitroGLYCERIN, ondansetron **OR** ondansetron (ZOFRAN) IV   Vital Signs    Vitals:   04/15/19 1652 04/15/19 2112 04/16/19 0533 04/16/19 0900  BP: 122/87 (!) 138/94 130/83 135/84  Pulse: 67 75 60 71  Resp: 16 18 18 18   Temp: 98.5 F (36.9 C) 97.8 F (36.6 C) 97.9 F (36.6 C) 98 F (36.7 C)  TempSrc: Oral Oral Oral Oral  SpO2: 100% 100% 97% 100%  Weight:      Height:        Intake/Output Summary (Last 24 hours) at 04/16/2019 1050 Last data filed at 04/16/2019 0600 Gross per 24 hour  Intake 529.98 ml  Output 0 ml  Net 529.98 ml   Last 3 Weights 04/14/2019 04/13/2019 04/06/2019  Weight (lbs) 115 lb 4.8 oz 116 lb 116 lb  Weight (kg) 52.3 kg 52.617 kg 52.617 kg      Telemetry    Sinus rhythm- Personally Reviewed  ECG    Sinus rhythm, poor R wave progression with Q waves in leads V1-V3, left axis deviation, no acute  repolarization abnormalities- Personally Reviewed  Physical Exam  Thin, a little anxious GEN: No acute distress.   Neck: No JVD Cardiac: RRR, no murmurs, rubs, or gallops.  Respiratory:  Diminished breath sounds throughout, but clear to auscultation bilaterally. GI: Soft, nontender, non-distended  MS: No edema; No deformity. Neuro:  Nonfocal  Psych: Normal affect   Labs    High Sensitivity Troponin:   Recent Labs  Lab 04/13/19 0848 04/13/19 1511 04/13/19 2122 04/14/19 0526 04/15/19 1500  TROPONINIHS 99* 287* 372* 299* 74*      Chemistry Recent Labs  Lab 04/13/19 0640 04/14/19 0526  NA 141 141  K 3.3* 4.3  CL 100 103  CO2 26 26  GLUCOSE 104* 134*  BUN 18 13  CREATININE 1.05* 0.90  CALCIUM 9.4 10.0  GFRNONAA 60* >60  GFRAA >60 >60  ANIONGAP 15 12     Hematology Recent Labs  Lab 04/14/19 0526 04/15/19 0453 04/16/19 0536  WBC 6.8 11.5* 10.6*  RBC 4.32 4.20 4.05  HGB 12.4 12.2 11.8*  HCT 37.6 36.5 35.9*  MCV 87.0 86.9 88.6  MCH 28.7 29.0 29.1  MCHC 33.0 33.4 32.9  RDW 12.9 13.1 13.2  PLT 237 269 280    BNP Recent Labs  Lab 04/13/19 0640  BNP 27.1     DDimer No results for input(s): DDIMER in the last 168 hours.   Radiology    ECHOCARDIOGRAM COMPLETE  Result Date: 04/14/2019   ECHOCARDIOGRAM REPORT   Patient Name:   NINETTE COTTA Date of Exam: 04/14/2019 Medical Rec #:  [id number]              Height:       61.0 in Accession #:    [id number]             Weight:       115.3 lb Date of Birth:  1965/06/16               BSA:          1.49 m Patient Age:    53 years               BP:           134/93 mmHg Patient Gender: F                      HR:           84 bpm. Exam Location:  Inpatient Procedure: 2D Echo, Cardiac Doppler and Color Doppler Indications:    NSTEMI I21.4  History:        Patient has prior history of Echocardiogram examinations, most                 recent 12/20/2018. COPD, Signs/Symptoms:Chest Pain; Risk                  Factors:Hypertension, Dyslipidemia and Non-Smoker. Elevated                 troponin.  Sonographer:    Tonia Ghent RDCS Referring Phys: [id number] Tanna Savoy [facility]  Sonographer Comments: Technically difficult study due to poor echo windows. Image acquisition challenging due to respiratory motion. Patient unable to lay on back or on side. IMPRESSIONS  1. Left ventricular ejection fraction, by visual estimation, is 60 to 65%. The left ventricle has normal function. There is no left ventricular hypertrophy.  2. Left ventricular diastolic parameters are consistent with Grade I diastolic dysfunction (impaired relaxation).  3. The left ventricle has no regional wall motion abnormalities.  4. Global right ventricle has normal systolic function.The right ventricular size is normal. No increase in right ventricular wall thickness.  5. Left atrial size was normal.  6. Right atrial size was normal.  7. The mitral valve is normal in structure. No evidence of mitral valve regurgitation. No evidence of mitral stenosis.  8. The tricuspid valve is normal in structure.  9. The aortic valve is normal in structure. Aortic valve regurgitation is not visualized. No evidence of aortic valve sclerosis or stenosis. 10. The pulmonic valve was normal in structure. Pulmonic valve regurgitation is not visualized. 11. TR signal is inadequate for assessing pulmonary artery systolic pressure. 12. The inferior vena cava is normal in size with greater than 50% respiratory variability, suggesting right atrial pressure of 3 mmHg. FINDINGS  Left Ventricle: Left ventricular ejection fraction, by visual estimation, is 60 to 65%. The left ventricle has normal function. The left ventricle has no regional wall motion abnormalities. There is no left ventricular hypertrophy. Left ventricular diastolic parameters are consistent with Grade I diastolic dysfunction (impaired relaxation). Normal left atrial pressure. Right Ventricle: The right ventricular  size is normal. No increase in right ventricular wall thickness. Global RV systolic function is has  normal systolic function. Left Atrium: Left atrial size was normal in size. Right Atrium: Right atrial size was normal in size Pericardium: There is no evidence of pericardial effusion. Mitral Valve: The mitral valve is normal in structure. No evidence of mitral valve regurgitation. No evidence of mitral valve stenosis by observation. Tricuspid Valve: The tricuspid valve is normal in structure. Tricuspid valve regurgitation is not demonstrated. Aortic Valve: The aortic valve is normal in structure. Aortic valve regurgitation is not visualized. The aortic valve is structurally normal, with no evidence of sclerosis or stenosis. Pulmonic Valve: The pulmonic valve was normal in structure. Pulmonic valve regurgitation is not visualized. Pulmonic regurgitation is not visualized. Aorta: The aortic root, ascending aorta and aortic arch are all structurally normal, with no evidence of dilitation or obstruction. Venous: The inferior vena cava is normal in size with greater than 50% respiratory variability, suggesting right atrial pressure of 3 mmHg. IAS/Shunts: No atrial level shunt detected by color flow Doppler. There is no evidence of a patent foramen ovale. No ventricular septal defect is seen or detected. There is no evidence of an atrial septal defect.  LEFT VENTRICLE PLAX 2D LVIDd:         4.49 cm  Diastology LVIDs:         3.29 cm  LV e' lateral:   5.78 cm/s LV PW:         0.68 cm  LV E/e' lateral: 8.2 LV IVS:        0.68 cm  LV e' medial:    4.20 cm/s LVOT diam:     1.70 cm  LV E/e' medial:  11.3 LV SV:         48 ml LV SV Index:   32.01 LVOT Area:     2.27 cm  RIGHT VENTRICLE RV S prime:     6.08 cm/s TAPSE (M-mode): 1.6 cm LEFT ATRIUM             Index      RIGHT ATRIUM          Index LA diam:        2.10 cm 1.41 cm/m RA Area:     5.93 cm LA Vol (A2C):   8.4 ml  5.64 ml/m RA Volume:   10.30 ml 6.89 ml/m LA Vol  (A4C):   9.1 ml  6.09 ml/m LA Biplane Vol: 9.3 ml  6.24 ml/m  AORTIC VALVE LVOT Vmax:   68.50 cm/s LVOT Vmean:  46.100 cm/s LVOT VTI:    0.114 m  AORTA Ao Root diam: 2.60 cm MITRAL VALVE MV Area (PHT): 2.16 cm             SHUNTS MV PHT:        102.08 msec          Systemic VTI:  0.11 m MV Decel Time: 352 msec             Systemic Diam: 1.70 cm MV E velocity: 47.60 cm/s 103 cm/s MV A velocity: 76.30 cm/s 70.3 cm/s MV E/A ratio:  0.62       1.5  Ulus Hazen MD Electronically signed by Thurmon FairMihai Huckleberry Martinson MD Signature Date/Time: 04/14/2019/3:25:11 PM    Final     Cardiac Studies   Echo above  Patient Profile     53 y.o. female with COPD exacerbation and chest pain with elevated high-sensitivity troponin, history of hypertension.  Assessment & Plan    Continues to have chest discomfort when she is tachycardic.  She may not be a good candidate for CT angiography due to fast heart rates and probable inability to tolerate higher beta-blocker doses. Is still unable to lie flat, which prevents both invasive coronary angiography and coronary CT angiography. We will switch to bisoprolol which is the most selective beta-blocker we can use. It has been 72 hours since presentation so we will stop her heparin.     For questions or updates, please contact [facility] Please consult [email or web address] for contact info under        Signed, Thurmon Fair, MD  04/16/2019, 10:50 AM

## 2019-04-16 NOTE — Plan of Care (Signed)
  Problem: Clinical Measurements: Goal: Diagnostic test results will improve Outcome: Completed/Met   Problem: Activity: Goal: Risk for activity intolerance will decrease Outcome: Completed/Met   Problem: Nutrition: Goal: Adequate nutrition will be maintained Outcome: Completed/Met   Problem: Coping: Goal: Level of anxiety will decrease Outcome: Completed/Met   Problem: Pain Managment: Goal: General experience of comfort will improve Outcome: Completed/Met   Problem: Safety: Goal: Ability to remain free from injury will improve Outcome: Completed/Met   Problem: Skin Integrity: Goal: Risk for impaired skin integrity will decrease Outcome: Completed/Met

## 2019-04-16 NOTE — Progress Notes (Signed)
PROGRESS NOTE    Kathleen Cameron   UDJ:[id number]  DOB: 01-21-1966  DOA: 04/13/2019 PCP: Horald Pollen, MD   Brief Narrative:  Kathleen Cameron is a 53 y.o. female with medical history significant of COPD on 2 L of oxygen at home, hypertension, hyperlipidemia, CKD stage III presents to emergency department due to worsening shortness of breath.  Patient tells me that she has chronic shortness of breath however this morning she was not able to catch her breath, associated with wheezing and intermittent midsternal chest pain since 2 weeks, 6 out of 10, pressure-like, feels like elephant sitting on her chest, nonradiating denies association with leg swelling, palpitation, orthopnea, PND, cough, congestion, fever, chills, nausea, vomiting, diarrhea, abdominal pain, headache or blurry vision.  She lives with her husband and denies smoking, alcohol, illicit drug use.  She tells me she never smoked cigarettes.  Reviewed her PFT from 10/14/2018 which shows severe obstructive pulmonary disease.  Transthoracic echo from 12/20/2018 showed preserved ejection fraction with diastolic dysfunction.  She is compliant with her home inhalers.   Subjective: Her dyspnea continues to improve but she states that she is very short of breath with a mild amount of exertion.  Assessment & Plan:   Principal Problem:   Acute on chronic respiratory failure with hypoxia -COVID-19 and influenza PCR is negative -Continue Solu-Medrol, inhalers, nebulizers and a azithromycin - I recommended to her that she have a Neb machine at home- she is in agreement with this  Active Problems:   Elevated troponin with chest pain/ NSTEMI -The patient was started on a heparin infusion-a cardiac cath was attempted however, the patient was not able to lay flat due to dyspnea and therefore it was canceled -on heparin infusion per cardiology- it will be d/c today-  -Troponin levels are improving    Essential  hypertension -on Metoprolol, lisinopril and amlodipine- Metoprolol being changed to Bisoprolol- BP well controlled    Dyslipidemia -Continue Crestor      CKD (chronic kidney disease) The patient's creatinine has improved to 0.90 today    Hypokalemia -Has been adequately replaced    Time spent in minutes: 35 DVT prophylaxis: Heparin infusion Code Status: Full code Family Communication:  Disposition Plan: cont to treat- home when improved Consultants:   cardiology Procedures:   Attempted cath Antimicrobials:  Anti-infectives (From admission, onward)   Start     Dose/Rate Route Frequency Ordered Stop   04/13/19 1300  azithromycin (ZITHROMAX) tablet 500 mg     500 mg Oral Daily 04/13/19 1259 04/15/19 1003       Objective: Vitals:   04/15/19 1652 04/15/19 2112 04/16/19 0533 04/16/19 0900  BP: 122/87 (!) 138/94 130/83 135/84  Pulse: 67 75 60 71  Resp: 16 18 18 18   Temp: 98.5 F (36.9 C) 97.8 F (36.6 C) 97.9 F (36.6 C) 98 F (36.7 C)  TempSrc: Oral Oral Oral Oral  SpO2: 100% 100% 97% 100%  Weight:      Height:        Intake/Output Summary (Last 24 hours) at 04/16/2019 1303 Last data filed at 04/16/2019 0600 Gross per 24 hour  Intake 309.98 ml  Output 0 ml  Net 309.98 ml   Filed Weights   04/13/19 0620 04/14/19 0428  Weight: 52.6 kg 52.3 kg    Examination: General exam: Appears comfortable  HEENT: PERRLA, oral mucosa moist, no sclera icterus or thrush Respiratory system: Clear to auscultation. Respiratory effort normal. Cardiovascular system: S1 & S2 heard,  No  murmurs  Gastrointestinal system: Abdomen soft, non-tender, nondistended. Normal bowel sounds   Central nervous system: Alert and oriented. No focal neurological deficits. Extremities: No cyanosis, clubbing or edema Skin: No rashes or ulcers Psychiatry:  Mood & affect appropriate.    Data Reviewed: I have personally reviewed following labs and imaging studies  CBC: Recent Labs  Lab  04/13/19 0640 04/14/19 0526 04/15/19 0453 04/16/19 0536  WBC 11.4* 6.8 11.5* 10.6*  NEUTROABS 9.1*  --   --   --   HGB 12.2 12.4 12.2 11.8*  HCT 37.3 37.6 36.5 35.9*  MCV 89.4 87.0 86.9 88.6  PLT 239 237 269 280   Basic Metabolic Panel: Recent Labs  Lab 04/13/19 0640 04/13/19 1511 04/14/19 0526  NA 141  --  141  K 3.3*  --  4.3  CL 100  --  103  CO2 26  --  26  GLUCOSE 104*  --  134*  BUN 18  --  13  CREATININE 1.05*  --  0.90  CALCIUM 9.4  --  10.0  MG  --  1.9  --   PHOS  --  2.8  --    GFR: Estimated Creatinine Clearance: 53.3 mL/min (by C-G formula based on SCr of 0.9 mg/dL). Liver Function Tests: No results for input(s): AST, ALT, ALKPHOS, BILITOT, PROT, ALBUMIN in the last 168 hours. No results for input(s): LIPASE, AMYLASE in the last 168 hours. No results for input(s): AMMONIA in the last 168 hours. Coagulation Profile: No results for input(s): INR, PROTIME in the last 168 hours. Cardiac Enzymes: No results for input(s): CKTOTAL, CKMB, CKMBINDEX, TROPONINI in the last 168 hours. BNP (last 3 results) No results for input(s): PROBNP in the last 8760 hours. HbA1C: No results for input(s): HGBA1C in the last 72 hours. CBG: No results for input(s): GLUCAP in the last 168 hours. Lipid Profile: No results for input(s): CHOL, HDL, LDLCALC, TRIG, CHOLHDL, LDLDIRECT in the last 72 hours. Thyroid Function Tests: No results for input(s): TSH, T4TOTAL, FREET4, T3FREE, THYROIDAB in the last 72 hours. Anemia Panel: No results for input(s): VITAMINB12, FOLATE, FERRITIN, TIBC, IRON, RETICCTPCT in the last 72 hours. Urine analysis:    Component Value Date/Time   COLORURINE YELLOW 11/12/2008 1440   APPEARANCEUR CLEAR 11/12/2008 1440   LABSPEC 1.016 11/12/2008 1440   PHURINE 7.5 11/12/2008 1440   GLUCOSEU NEGATIVE 11/12/2008 1440   HGBUR TRACE (A) 11/12/2008 1440   BILIRUBINUR NEGATIVE 11/12/2008 1440   KETONESUR NEGATIVE 11/12/2008 1440   PROTEINUR NEGATIVE  11/12/2008 1440   UROBILINOGEN 1.0 11/12/2008 1440   NITRITE NEGATIVE 11/12/2008 1440   LEUKOCYTESUR SMALL (A) 11/12/2008 1440   Sepsis Labs: @LABRCNTIP (procalcitonin:4,lacticidven:4) ) Recent Results (from the past 240 hour(s))  Respiratory Panel by RT PCR (Flu A&B, Covid) - Nasopharyngeal Swab     Status: None   Collection Time: 04/13/19  9:29 AM   Specimen: Nasopharyngeal Swab  Result Value Ref Range Status   SARS Coronavirus 2 by RT PCR NEGATIVE NEGATIVE Final    Comment: (NOTE) SARS-CoV-2 target nucleic acids are NOT DETECTED. The SARS-CoV-2 RNA is generally detectable in upper respiratoy specimens during the acute phase of infection. The lowest concentration of SARS-CoV-2 viral copies this assay can detect is 131 copies/mL. A negative result does not preclude SARS-Cov-2 infection and should not be used as the sole basis for treatment or other patient management decisions. A negative result may occur with  improper specimen collection/handling, submission of specimen other than nasopharyngeal swab, presence of  viral mutation(s) within the areas targeted by this assay, and inadequate number of viral copies (<131 copies/mL). A negative result must be combined with clinical observations, patient history, and epidemiological information. The expected result is Negative. Fact Sheet for Patients:  https://www.moore.com/ Fact Sheet for Healthcare Providers:  https://www.young.biz/ This test is not yet ap proved or cleared by the [place] FDA and  has been authorized for detection and/or diagnosis of SARS-CoV-2 by FDA under an Emergency Use Authorization (EUA). This EUA will remain  in effect (meaning this test can be used) for the duration of the COVID-19 declaration under Section 564(b)(1) of the Act, 21 U.S.C. section 360bbb-3(b)(1), unless the authorization is terminated or revoked sooner.    Influenza A by PCR NEGATIVE NEGATIVE  Final   Influenza B by PCR NEGATIVE NEGATIVE Final    Comment: (NOTE) The Xpert Xpress SARS-CoV-2/FLU/RSV assay is intended as an aid in  the diagnosis of influenza from Nasopharyngeal swab specimens and  should not be used as a sole basis for treatment. Nasal washings and  aspirates are unacceptable for Xpert Xpress SARS-CoV-2/FLU/RSV  testing. Fact Sheet for Patients: https://www.moore.com/ Fact Sheet for Healthcare Providers: https://www.young.biz/ This test is not yet approved or cleared by the [place] FDA and  has been authorized for detection and/or diagnosis of SARS-CoV-2 by  FDA under an Emergency Use Authorization (EUA). This EUA will remain  in effect (meaning this test can be used) for the duration of the  Covid-19 declaration under Section 564(b)(1) of the Act, 21  U.S.C. section 360bbb-3(b)(1), unless the authorization is  terminated or revoked. Performed at [facility], [address], [place], [place] [address]   Culture, blood (routine x 2)     Status: None (Preliminary result)   Collection Time: 04/13/19  3:11 PM   Specimen: BLOOD  Result Value Ref Range Status   Specimen Description BLOOD SITE NOT SPECIFIED  Final   Special Requests   Final    BOTTLES DRAWN AEROBIC AND ANAEROBIC Blood Culture adequate volume   Culture   Final    NO GROWTH 3 DAYS Performed at [facility], [address] [place], [place] [address]    Report Status PENDING  Incomplete  Culture, blood (routine x 2)     Status: None (Preliminary result)   Collection Time: 04/13/19  3:11 PM   Specimen: BLOOD  Result Value Ref Range Status   Specimen Description BLOOD SITE NOT SPECIFIED  Final   Special Requests   Final    BOTTLES DRAWN AEROBIC AND ANAEROBIC Blood Culture adequate volume   Culture   Final    NO GROWTH 3 DAYS Performed at [facility], [address], [place], [place] [address]    Report Status PENDING  Incomplete           Radiology Studies: ECHOCARDIOGRAM COMPLETE  Result Date: 04/14/2019   ECHOCARDIOGRAM REPORT   Patient Name:   JAZLYNN NEMETZ Date of Exam: 04/14/2019 Medical Rec #:  [id number]              Height:       61.0 in Accession #:    [id number]             Weight:       115.3 lb Date of Birth:  [birth year]               BSA:          1.49 m Patient Age:  53 years               BP:           134/93 mmHg Patient Gender: F                      HR:           84 bpm. Exam Location:  Inpatient Procedure: 2D Echo, Cardiac Doppler and Color Doppler Indications:    NSTEMI I21.4  History:        Patient has prior history of Echocardiogram examinations, most                 recent 12/20/2018. COPD, Signs/Symptoms:Chest Pain; Risk                 Factors:Hypertension, Dyslipidemia and Non-Smoker. Elevated                 troponin.  Sonographer:    Tonia GhentJulia Underwood RDCS Referring Phys: [id number] Tanna SavoyCHRISTOPHER L [facility] Hospital [facility] San AntonioCHUMANN  Sonographer Comments: Technically difficult study due to poor echo windows. Image acquisition challenging due to respiratory motion. Patient unable to lay on back or on side. IMPRESSIONS  1. Left ventricular ejection fraction, by visual estimation, is 60 to 65%. The left ventricle has normal function. There is no left ventricular hypertrophy.  2. Left ventricular diastolic parameters are consistent with Grade I diastolic dysfunction (impaired relaxation).  3. The left ventricle has no regional wall motion abnormalities.  4. Global right ventricle has normal systolic function.The right ventricular size is normal. No increase in right ventricular wall thickness.  5. Left atrial size was normal.  6. Right atrial size was normal.  7. The mitral valve is normal in structure. No evidence of mitral valve regurgitation. No evidence of mitral stenosis.  8. The tricuspid valve is normal in structure.  9. The aortic valve is normal in structure. Aortic valve regurgitation is not visualized. No evidence of  aortic valve sclerosis or stenosis. 10. The pulmonic valve was normal in structure. Pulmonic valve regurgitation is not visualized. 11. TR signal is inadequate for assessing pulmonary artery systolic pressure. 12. The inferior vena cava is normal in size with greater than 50% respiratory variability, suggesting right atrial pressure of 3 mmHg. FINDINGS  Left Ventricle: Left ventricular ejection fraction, by visual estimation, is 60 to 65%. The left ventricle has normal function. The left ventricle has no regional wall motion abnormalities. There is no left ventricular hypertrophy. Left ventricular diastolic parameters are consistent with Grade I diastolic dysfunction (impaired relaxation). Normal left atrial pressure. Right Ventricle: The right ventricular size is normal. No increase in right ventricular wall thickness. Global RV systolic function is has normal systolic function. Left Atrium: Left atrial size was normal in size. Right Atrium: Right atrial size was normal in size Pericardium: There is no evidence of pericardial effusion. Mitral Valve: The mitral valve is normal in structure. No evidence of mitral valve regurgitation. No evidence of mitral valve stenosis by observation. Tricuspid Valve: The tricuspid valve is normal in structure. Tricuspid valve regurgitation is not demonstrated. Aortic Valve: The aortic valve is normal in structure. Aortic valve regurgitation is not visualized. The aortic valve is structurally normal, with no evidence of sclerosis or stenosis. Pulmonic Valve: The pulmonic valve was normal in structure. Pulmonic valve regurgitation is not visualized. Pulmonic regurgitation is not visualized. Aorta: The aortic root, ascending aorta and aortic arch are all structurally normal, with no evidence of dilitation or obstruction. Venous: The  inferior vena cava is normal in size with greater than 50% respiratory variability, suggesting right atrial pressure of 3 mmHg. IAS/Shunts: No atrial  level shunt detected by color flow Doppler. There is no evidence of a patent foramen ovale. No ventricular septal defect is seen or detected. There is no evidence of an atrial septal defect.  LEFT VENTRICLE PLAX 2D LVIDd:         4.49 cm  Diastology LVIDs:         3.29 cm  LV e' lateral:   5.78 cm/s LV PW:         0.68 cm  LV E/e' lateral: 8.2 LV IVS:        0.68 cm  LV e' medial:    4.20 cm/s LVOT diam:     1.70 cm  LV E/e' medial:  11.3 LV SV:         48 ml LV SV Index:   32.01 LVOT Area:     2.27 cm  RIGHT VENTRICLE RV S prime:     6.08 cm/s TAPSE (M-mode): 1.6 cm LEFT ATRIUM             Index      RIGHT ATRIUM          Index LA diam:        2.10 cm 1.41 cm/m RA Area:     5.93 cm LA Vol (A2C):   8.4 ml  5.64 ml/m RA Volume:   10.30 ml 6.89 ml/m LA Vol (A4C):   9.1 ml  6.09 ml/m LA Biplane Vol: 9.3 ml  6.24 ml/m  AORTIC VALVE LVOT Vmax:   68.50 cm/s LVOT Vmean:  46.100 cm/s LVOT VTI:    0.114 m  AORTA Ao Root diam: 2.60 cm MITRAL VALVE MV Area (PHT): 2.16 cm             SHUNTS MV PHT:        102.08 msec          Systemic VTI:  0.11 m MV Decel Time: 352 msec             Systemic Diam: 1.70 cm MV E velocity: 47.60 cm/s 103 cm/s MV A velocity: 76.30 cm/s 70.3 cm/s MV E/A ratio:  0.62       1.5  Mihai Croitoru MD Electronically signed by Thurmon Fair MD Signature Date/Time: 04/14/2019/3:25:11 PM    Final       Scheduled Meds: . albuterol  2.5 mg Nebulization BID  . amLODipine  10 mg Oral Daily  . aspirin EC  81 mg Oral Daily  . bisoprolol  10 mg Oral Daily  . famotidine  20 mg Oral BID  . feeding supplement (ENSURE ENLIVE)  237 mL Oral BID BM  . guaiFENesin  600 mg Oral BID  . lisinopril  20 mg Oral Daily  . methylPREDNISolone (SOLU-MEDROL) injection  40 mg Intravenous Q12H  . polyethylene glycol  17 g Oral Daily  . rosuvastatin  20 mg Oral q1800  . sodium chloride flush  3 mL Intravenous Q12H  . umeclidinium bromide  1 puff Inhalation Daily   Continuous Infusions:    LOS: 3 days       [name], MD [facility] Pager: [email or web address] Password [email or web address] 04/16/2019, 1:03 PM

## 2019-04-17 ENCOUNTER — Other Ambulatory Visit: Payer: Self-pay | Admitting: Physician Assistant

## 2019-04-17 DIAGNOSIS — R072 Precordial pain: Secondary | ICD-10-CM

## 2019-04-17 LAB — BASIC METABOLIC PANEL
Anion gap: 6 (ref 5–15)
BUN: 34 mg/dL — ABNORMAL HIGH (ref 6–20)
CO2: 31 mmol/L (ref 22–32)
Calcium: 9.5 mg/dL (ref 8.9–10.3)
Chloride: 102 mmol/L (ref 98–111)
Creatinine, Ser: 1.08 mg/dL — ABNORMAL HIGH (ref 0.44–1.00)
GFR calc Af Amer: >60 mL/min (ref >60)
GFR calc non Af Amer: 58 mL/min — ABNORMAL LOW (ref >60)
Glucose, Bld: 110 mg/dL — ABNORMAL HIGH (ref 70–99)
Potassium: 5.1 mmol/L (ref 3.5–5.1)
Sodium: 139 mmol/L (ref 135–145)

## 2019-04-17 LAB — CBC
HCT: 35.3 % — ABNORMAL LOW (ref 36.0–46.0)
Hemoglobin: 11.7 g/dL — ABNORMAL LOW (ref 12.0–15.0)
MCH: 29.2 pg (ref 26.0–34.0)
MCHC: 33.1 g/dL (ref 30.0–36.0)
MCV: 88 fL (ref 80.0–100.0)
Platelets: 262 10*3/uL (ref 150–400)
RBC: 4.01 MIL/uL (ref 3.87–5.11)
RDW: 13.1 % (ref 11.5–15.5)
WBC: 7.9 10*3/uL (ref 4.0–10.5)
nRBC: 0 % (ref 0.0–0.2)

## 2019-04-17 MED ORDER — ALBUTEROL SULFATE (2.5 MG/3ML) 0.083% IN NEBU: 2.5000 mg | INHALATION_SOLUTION | RESPIRATORY_TRACT | Status: DC | PRN

## 2019-04-17 MED ORDER — POLYETHYLENE GLYCOL 3350 17 G PO PACK: 17.0000 g | PACK | Freq: Every day | ORAL | 0 refills | Status: DC

## 2019-04-17 MED ORDER — IPRATROPIUM BROMIDE 0.02 % IN SOLN: 0.5000 mg | RESPIRATORY_TRACT | 12 refills | Status: DC | PRN

## 2019-04-17 MED ORDER — PREDNISONE 20 MG PO TABS: 40.0000 mg | ORAL_TABLET | Freq: Every day | ORAL | 0 refills | Status: DC

## 2019-04-17 MED ORDER — BISOPROLOL FUMARATE 10 MG PO TABS: 10.0000 mg | ORAL_TABLET | Freq: Every day | ORAL | 1 refills | Status: DC

## 2019-04-17 MED ORDER — FAMOTIDINE 20 MG PO TABS: 20.0000 mg | ORAL_TABLET | Freq: Two times a day (BID) | ORAL | 1 refills | Status: DC | PRN

## 2019-04-17 MED ORDER — NITROGLYCERIN 0.4 MG SL SUBL: 0.4000 mg | SUBLINGUAL_TABLET | SUBLINGUAL | 0 refills | Status: DC | PRN

## 2019-04-17 MED ORDER — ASPIRIN 81 MG PO TBEC: 81.0000 mg | DELAYED_RELEASE_TABLET | Freq: Every day | ORAL | 0 refills | Status: DC

## 2019-04-17 MED ORDER — ALBUTEROL SULFATE (2.5 MG/3ML) 0.083% IN NEBU: 2.5000 mg | INHALATION_SOLUTION | Freq: Four times a day (QID) | RESPIRATORY_TRACT | 12 refills | Status: DC | PRN

## 2019-04-17 NOTE — Progress Notes (Signed)
Kathleen Cameron to be discharged home per MD order. Discussed prescriptions and follow up appointments with the patient. Prescriptions given to patient; medication list explained in detail. Patient verbalized understanding.  Skin clean, dry and intact without evidence of skin break down, no evidence of skin tears noted. IV catheter discontinued intact. Site without signs and symptoms of complications. Dressing and pressure applied. Pt denies pain at the site currently. No complaints noted.  Patient free of lines, drains, and wounds.   An After Visit Summary (AVS) was printed and given to the patient. Patient escorted via wheelchair, and discharged home via private auto.  Baldo Ash, RN

## 2019-04-17 NOTE — Plan of Care (Signed)
  Problem: Clinical Measurements: Goal: Ability to maintain clinical measurements within normal limits will improve Outcome: Progressing   

## 2019-04-17 NOTE — TOC Transition Note (Signed)
Transition of Care [facility]) - CM/SW Discharge Note   Patient Details  Name: Kathleen Cameron MRN: [id number] Date of Birth: Dec 05, 1965  Transition of Care [facility]) CM/SW Contact:  Carles Collet, RN Phone Number: 04/17/2019, 8:41 AM   Clinical Narrative:     Nebulizer to be delivered to room prior to DC. Patient aware. No other CM needs identified at this time          Patient Goals and CMS Choice        Discharge Placement                       Discharge Plan and Services                DME Arranged: Nebulizer machine DME Agency: [employer] Date DME Agency Contacted: 04/17/19 Time DME Agency Contacted: [phone] Representative spoke with at DME Agency: [place] ([place]) Interventions     Readmission Risk Interventions No flowsheet data found.

## 2019-04-17 NOTE — Discharge Summary (Signed)
Physician Discharge Summary  Kathleen Cameron ZOX:[id number] DOB: 10/06/1965 DOA: 04/13/2019  PCP: [name], MD  Admit date: 04/13/2019 Discharge date: 04/17/2019  Admitted From: home  Disposition:  home       Discharge Condition:  stable   CODE STATUS:  Full code   Diet recommendation:  Heart healthy Consultations:  cardiology    Discharge Diagnoses:  Principal Problem:   Acute on chronic respiratory failure with hypoxia (HCC) Active Problems:  Chest pain- Elevated troponin   Essential hypertension   Dyslipidemia   COPD (chronic obstructive pulmonary disease) (HCC)   CKD (chronic kidney disease)   Hypokalemia     Brief Summary: Kathleen Cameron is a 53 y.o.femalewith medical history significant ofCOPD on 2 L of oxygen at home, hypertension, hyperlipidemia, CKD stage III presents to emergency department due to worsening shortness of breath.  Patient tells me that she has chronic shortness of breath however this morning she was not able to catch her breath, associated with wheezing and intermittent midsternal chest painsince 2 weeks,6 out of 10, pressure-like, feels like elephant sitting on her chest, nonradiating denies association with leg swelling, palpitation, orthopnea, PND, cough, congestion, fever, chills, nausea, vomiting, diarrhea, abdominal pain, headache or blurry vision.  She lives with her husband and denies smoking, alcohol, illicit drug use. She tells me she never smoked cigarettes.  Reviewed her PFT from 10/14/2018 which shows severe obstructive pulmonary disease.Transthoracic echo from 12/20/2018 showed preserved ejection fraction with diastolic dysfunction. She is compliant with her home inhalers.  Hospital Course:  Principal Problem:   Acute on chronic respiratory failure with hypoxia -COVID-19, influenza PCR is negative- RSV panel neg -treated with Solu-Medrol, inhalers, nebulizers and a azithromycin - I recommended to  her that she have a Neb machine at home and have prescribed one  Active Problems:   Elevated troponin with chest pain/ NSTEMI -The patient was started on a heparin infusion-a cardiac cath was attempted however, the patient was not able to lay flat due to dyspnea and therefore it was canceled -on heparin infusion per cardiology x 3 days -Troponin level increased to 372 and subsequently improved - outpt follow up recommended    Essential hypertension -on Metoprolol, lisinopril and amlodipine- Metoprolol changed to Bisoprolol by cardiology- BP well controlled    Dyslipidemia -Continue Crestor     CKD (chronic kidney disease) 2-3 -  Creatinine~   0.90  - 1.0    Hypokalemia -Has been adequately replaced   Discharge Exam: Vitals:   04/17/19 0704 04/17/19 0848  BP:  118/87  Pulse:  64  Resp:  18  Temp:  98 F (36.7 C)  SpO2: 98% 99%   Vitals:   04/16/19 2341 04/17/19 0423 04/17/19 0704 04/17/19 0848  BP:  129/83  118/87  Pulse:  62  64  Resp:  20  18  Temp:  98.4 F (36.9 C)  98 F (36.7 C)  TempSrc:  Oral  Oral  SpO2: 95% 97% 98% 99%  Weight:      Height:        General: Pt is alert, awake, not in acute distress Cardiovascular: RRR, S1/S2 +, no rubs, no gallops Respiratory: CTA bilaterally, no wheezing, no rhonchi Abdominal: Soft, NT, ND, bowel sounds + Extremities: no edema, no cyanosis   Discharge Instructions  Discharge Instructions    Diet - low sodium heart healthy   Complete by: As directed    Increase activity slowly   Complete by: As directed  Allergies as of 04/17/2019      Reactions   Stiolto Respimat [tiotropium Bromide-olodaterol] Other (See Comments)   Severe headaches and rapid heartrate      Medication List    STOP taking these medications   hydrochlorothiazide 25 MG tablet Commonly known as: HYDRODIURIL     TAKE these medications   acetaminophen 325 MG tablet Commonly known as: TYLENOL Take 2 tablets (650 mg total) by  mouth every 6 (six) hours as needed for mild pain (or Fever >/= 101).   albuterol 108 (90 Base) MCG/ACT inhaler Commonly known as: VENTOLIN HFA INHALE 2 PUFFS INTO THE LUNGS EVERY 6 HOURS AS NEEDED FOR WHEEZING OR SHORTNESS OF BREATH What changed:   how much to take  how to take this  when to take this  reasons to take this   albuterol (2.5 MG/3ML) 0.083% nebulizer solution Commonly known as: PROVENTIL Take 3 mLs (2.5 mg total) by nebulization every 6 (six) hours as needed for wheezing or shortness of breath. What changed: You were already taking a medication with the same name, and this prescription was added. Make sure you understand how and when to take each.   amLODipine 10 MG tablet Commonly known as: NORVASC Take 1 tablet (10 mg total) by mouth daily.   aspirin 81 MG EC tablet Take 1 tablet (81 mg total) by mouth daily.   bisoprolol 10 MG tablet Commonly known as: ZEBETA Take 1 tablet (10 mg total) by mouth daily.   budesonide-formoterol 160-4.5 MCG/ACT inhaler Commonly known as: SYMBICORT Inhale 2 puffs into the lungs 2 (two) times daily. What changed: Another medication with the same name was removed. Continue taking this medication, and follow the directions you see here.   famotidine 20 MG tablet Commonly known as: PEPCID Take 1 tablet (20 mg total) by mouth 2 (two) times daily as needed for heartburn or indigestion.   furosemide 40 MG tablet Commonly known as: LASIX Take 40 mg by mouth daily.   guaiFENesin 600 MG 12 hr tablet Commonly known as: MUCINEX Take 600 mg by mouth 2 (two) times daily as needed for cough or to loosen phlegm.   ipratropium 0.02 % nebulizer solution Commonly known as: ATROVENT Take 2.5 mLs (0.5 mg total) by nebulization every 4 (four) hours as needed for wheezing or shortness of breath.   lisinopril 40 MG tablet Commonly known as: ZESTRIL Take 1 tablet (40 mg total) by mouth daily.   nitroGLYCERIN 0.4 MG SL tablet Commonly  known as: NITROSTAT Place 1 tablet (0.4 mg total) under the tongue every 5 (five) minutes as needed for chest pain.   polyethylene glycol 17 g packet Commonly known as: MIRALAX / GLYCOLAX Take 17 g by mouth daily.   predniSONE 20 MG tablet Commonly known as: DELTASONE Take 2 tablets (40 mg total) by mouth daily.   rosuvastatin 20 MG tablet Commonly known as: CRESTOR Take 1 tablet (20 mg total) by mouth daily.   tiotropium 18 MCG inhalation capsule Commonly known as: SPIRIVA Place 1 capsule (18 mcg total) into inhaler and inhale daily.            Durable Medical Equipment  (From admission, onward)         Start     Ordered   04/15/19 0952  For home use only DME Nebulizer machine  Once    Question Answer Comment  Patient needs a nebulizer to treat with the following condition COPD exacerbation (HCC)   Length of Need Lifetime  04/15/19 5621         Follow-up Information    Donato Heinz, MD Follow up.   Specialty: Cardiology Why: office scheduler will contact you to arrange outpatient coronary CT then cardiology office followup afterward.  Contact information: [address] [place] [address] [phone]          Allergies  Allergen Reactions  . Stiolto Respimat [Tiotropium Bromide-Olodaterol] Other (See Comments)    Severe headaches and rapid heartrate     Procedures/Studies:    CT Angio Chest PE W and/or Wo Contrast  Result Date: 04/13/2019 CLINICAL DATA:  Increased shortness of breath, mid chest pain for 1 day. EXAM: CT ANGIOGRAPHY CHEST WITH CONTRAST TECHNIQUE: Multidetector CT imaging of the chest was performed using the standard protocol during bolus administration of intravenous contrast. Multiplanar CT image reconstructions and MIPs were obtained to evaluate the vascular anatomy. CONTRAST:  75 cc OMNIPAQUE IOHEXOL 350 MG/ML SOLN COMPARISON:  Chest CT dated 09/23/2018. FINDINGS: Cardiovascular: Heart size is  normal. No pericardial effusion. No thoracic aortic aneurysm or evidence of aortic dissection. Mediastinum/Nodes: Numerous calcified lymph nodes within the mediastinum and perihilar regions indicating previous granulomatous infection. No enlarged or suspicious lymph nodes. Esophagus is unremarkable. Trachea and central bronchi are unremarkable. Lungs/Pleura: Mild scarring/atelectasis at the lung bases. No evidence of pneumonia. No pleural effusion or pneumothorax. Again noted are scattered pleural based nodules which are not significantly changed in the interval, some again noted to be partially calcified suggesting sequela of previous granulomatous infection, silicosis or other pneumoconiosis, sarcoidosis, or less likely sequela of asbestos exposure. Stable emphysematous changes within the upper lobes, mild to moderate in degree. Upper Abdomen: Limited images of the upper abdomen are unremarkable. Musculoskeletal: No acute or suspicious osseous finding. Mild degenerative spondylosis of the scoliotic thoracic spine. Review of the MIP images confirms the above findings. IMPRESSION: 1. No acute findings. No evidence of pneumonia or pulmonary edema. No thoracic aortic aneurysm. 2. Numerous calcified lymph nodes within the mediastinum and perihilar regions, and stable small pleural based nodules within each lung, all of which is compatible with previous granulomatous infection, chronic silicosis or other pneumoconiosis, sarcoidosis or less likely sequela of asbestos exposure. 3. Additional chronic/incidental findings detailed above. Emphysema (ICD10-J43.9). Electronically Signed   By: Franki Cabot M.D.   On: 04/13/2019 11:33   DG Chest Port 1 View  Result Date: 04/13/2019 CLINICAL DATA:  Shortness of breath EXAM: PORTABLE CHEST 1 VIEW COMPARISON:  December 20, 2018 chest radiograph and chest CT September 23, 2018 FINDINGS: Upper lobe pulmonary nodular opacities are better seen on CT but appear stable by radiography. There  is no edema or consolidation. Heart size and pulmonary vascularity normal. No adenopathy. Aorta is somewhat tortuous but stable. There is degenerative change in the thoracic spine. IMPRESSION: Stable appearing nodular opacities in the upper lobes, largest on the right. No edema or consolidation. Stable cardiac silhouette. Stable aortic tortuosity. Electronically Signed   By: Lowella Grip III M.D.   On: 04/13/2019 07:04   ECHOCARDIOGRAM COMPLETE  Result Date: 04/14/2019   ECHOCARDIOGRAM REPORT   Patient Name:   AILEANA HODDER Date of Exam: 04/14/2019 Medical Rec #:  [id number]              Height:       61.0 in Accession #:    [id number]             Weight:       115.3 lb Date of Birth:  Apr 29, 1965               BSA:          1.49 m Patient Age:    53 years               BP:           134/93 mmHg Patient Gender: F                      HR:           84 bpm. Exam Location:  Inpatient Procedure: 2D Echo, Cardiac Doppler and Color Doppler Indications:    NSTEMI I21.4  History:        Patient has prior history of Echocardiogram examinations, most                 recent 12/20/2018. COPD, Signs/Symptoms:Chest Pain; Risk                 Factors:Hypertension, Dyslipidemia and Non-Smoker. Elevated                 troponin.  Sonographer:    Tonia Ghent RDCS Referring Phys: [id number] Tanna Savoy [facility]  Sonographer Comments: Technically difficult study due to poor echo windows. Image acquisition challenging due to respiratory motion. Patient unable to lay on back or on side. IMPRESSIONS  1. Left ventricular ejection fraction, by visual estimation, is 60 to 65%. The left ventricle has normal function. There is no left ventricular hypertrophy.  2. Left ventricular diastolic parameters are consistent with Grade I diastolic dysfunction (impaired relaxation).  3. The left ventricle has no regional wall motion abnormalities.  4. Global right ventricle has normal systolic function.The right ventricular size is  normal. No increase in right ventricular wall thickness.  5. Left atrial size was normal.  6. Right atrial size was normal.  7. The mitral valve is normal in structure. No evidence of mitral valve regurgitation. No evidence of mitral stenosis.  8. The tricuspid valve is normal in structure.  9. The aortic valve is normal in structure. Aortic valve regurgitation is not visualized. No evidence of aortic valve sclerosis or stenosis. 10. The pulmonic valve was normal in structure. Pulmonic valve regurgitation is not visualized. 11. TR signal is inadequate for assessing pulmonary artery systolic pressure. 12. The inferior vena cava is normal in size with greater than 50% respiratory variability, suggesting right atrial pressure of 3 mmHg. FINDINGS  Left Ventricle: Left ventricular ejection fraction, by visual estimation, is 60 to 65%. The left ventricle has normal function. The left ventricle has no regional wall motion abnormalities. There is no left ventricular hypertrophy. Left ventricular diastolic parameters are consistent with Grade I diastolic dysfunction (impaired relaxation). Normal left atrial pressure. Right Ventricle: The right ventricular size is normal. No increase in right ventricular wall thickness. Global RV systolic function is has normal systolic function. Left Atrium: Left atrial size was normal in size. Right Atrium: Right atrial size was normal in size Pericardium: There is no evidence of pericardial effusion. Mitral Valve: The mitral valve is normal in structure. No evidence of mitral valve regurgitation. No evidence of mitral valve stenosis by observation. Tricuspid Valve: The tricuspid valve is normal in structure. Tricuspid valve regurgitation is not demonstrated. Aortic Valve: The aortic valve is normal in structure. Aortic valve regurgitation is not visualized. The aortic valve is structurally normal, with no evidence of sclerosis or stenosis. Pulmonic Valve: The pulmonic valve was normal in  structure.  Pulmonic valve regurgitation is not visualized. Pulmonic regurgitation is not visualized. Aorta: The aortic root, ascending aorta and aortic arch are all structurally normal, with no evidence of dilitation or obstruction. Venous: The inferior vena cava is normal in size with greater than 50% respiratory variability, suggesting right atrial pressure of 3 mmHg. IAS/Shunts: No atrial level shunt detected by color flow Doppler. There is no evidence of a patent foramen ovale. No ventricular septal defect is seen or detected. There is no evidence of an atrial septal defect.  LEFT VENTRICLE PLAX 2D LVIDd:         4.49 cm  Diastology LVIDs:         3.29 cm  LV e' lateral:   5.78 cm/s LV PW:         0.68 cm  LV E/e' lateral: 8.2 LV IVS:        0.68 cm  LV e' medial:    4.20 cm/s LVOT diam:     1.70 cm  LV E/e' medial:  11.3 LV SV:         48 ml LV SV Index:   32.01 LVOT Area:     2.27 cm  RIGHT VENTRICLE RV S prime:     6.08 cm/s TAPSE (M-mode): 1.6 cm LEFT ATRIUM             Index      RIGHT ATRIUM          Index LA diam:        2.10 cm 1.41 cm/m RA Area:     5.93 cm LA Vol (A2C):   8.4 ml  5.64 ml/m RA Volume:   10.30 ml 6.89 ml/m LA Vol (A4C):   9.1 ml  6.09 ml/m LA Biplane Vol: 9.3 ml  6.24 ml/m  AORTIC VALVE LVOT Vmax:   68.50 cm/s LVOT Vmean:  46.100 cm/s LVOT VTI:    0.114 m  AORTA Ao Root diam: 2.60 cm MITRAL VALVE MV Area (PHT): 2.16 cm             SHUNTS MV PHT:        102.08 msec          Systemic VTI:  0.11 m MV Decel Time: 352 msec             Systemic Diam: 1.70 cm MV E velocity: 47.60 cm/s 103 cm/s MV A velocity: 76.30 cm/s 70.3 cm/s MV E/A ratio:  0.62       1.5  Mihai Croitoru MD Electronically signed by Thurmon Fair MD Signature Date/Time: 04/14/2019/3:25:11 PM    Final      The results of significant diagnostics from this hospitalization (including imaging, microbiology, ancillary and laboratory) are listed below for reference.     Microbiology: Recent Results (from the past 240  hour(s))  Respiratory Panel by RT PCR (Flu A&B, Covid) - Nasopharyngeal Swab     Status: None   Collection Time: 04/13/19  9:29 AM   Specimen: Nasopharyngeal Swab  Result Value Ref Range Status   SARS Coronavirus 2 by RT PCR NEGATIVE NEGATIVE Final    Comment: (NOTE) SARS-CoV-2 target nucleic acids are NOT DETECTED. The SARS-CoV-2 RNA is generally detectable in upper respiratoy specimens during the acute phase of infection. The lowest concentration of SARS-CoV-2 viral copies this assay can detect is 131 copies/mL. A negative result does not preclude SARS-Cov-2 infection and should not be used as the sole basis for treatment or other patient management decisions. A negative result  may occur with  improper specimen collection/handling, submission of specimen other than nasopharyngeal swab, presence of viral mutation(s) within the areas targeted by this assay, and inadequate number of viral copies (<131 copies/mL). A negative result must be combined with clinical observations, patient history, and epidemiological information. The expected result is Negative. Fact Sheet for Patients:  https://www.moore.com/ Fact Sheet for Healthcare Providers:  https://www.young.biz/ This test is not yet ap proved or cleared by the [place] FDA and  has been authorized for detection and/or diagnosis of SARS-CoV-2 by FDA under an Emergency Use Authorization (EUA). This EUA will remain  in effect (meaning this test can be used) for the duration of the COVID-19 declaration under Section 564(b)(1) of the Act, 21 U.S.C. section 360bbb-3(b)(1), unless the authorization is terminated or revoked sooner.    Influenza A by PCR NEGATIVE NEGATIVE Final   Influenza B by PCR NEGATIVE NEGATIVE Final    Comment: (NOTE) The Xpert Xpress SARS-CoV-2/FLU/RSV assay is intended as an aid in  the diagnosis of influenza from Nasopharyngeal swab specimens and  should not be used as  a sole basis for treatment. Nasal washings and  aspirates are unacceptable for Xpert Xpress SARS-CoV-2/FLU/RSV  testing. Fact Sheet for Patients: https://www.moore.com/ Fact Sheet for Healthcare Providers: https://www.young.biz/ This test is not yet approved or cleared by the [place] FDA and  has been authorized for detection and/or diagnosis of SARS-CoV-2 by  FDA under an Emergency Use Authorization (EUA). This EUA will remain  in effect (meaning this test can be used) for the duration of the  Covid-19 declaration under Section 564(b)(1) of the Act, 21  U.S.C. section 360bbb-3(b)(1), unless the authorization is  terminated or revoked. Performed at [facility], [address], [place], [place] [address]   Culture, blood (routine x 2)     Status: None (Preliminary result)   Collection Time: 04/13/19  3:11 PM   Specimen: BLOOD  Result Value Ref Range Status   Specimen Description BLOOD SITE NOT SPECIFIED  Final   Special Requests   Final    BOTTLES DRAWN AEROBIC AND ANAEROBIC Blood Culture adequate volume   Culture   Final    NO GROWTH 4 DAYS Performed at [facility], [address], [place], [place] [address]    Report Status PENDING  Incomplete  Culture, blood (routine x 2)     Status: None (Preliminary result)   Collection Time: 04/13/19  3:11 PM   Specimen: BLOOD  Result Value Ref Range Status   Specimen Description BLOOD SITE NOT SPECIFIED  Final   Special Requests   Final    BOTTLES DRAWN AEROBIC AND ANAEROBIC Blood Culture adequate volume   Culture   Final    NO GROWTH 4 DAYS Performed at [facility], [address] [place], [place] [address]    Report Status PENDING  Incomplete     Labs: BNP (last 3 results) Recent Labs    09/14/18 1220 09/23/18 1156 04/13/19 0640  BNP 7.2 22.5 27.1   Basic Metabolic Panel: Recent Labs  Lab 04/13/19 0640 04/13/19 1511 04/14/19 0526 04/17/19 0457  NA 141   --  141 139  K 3.3*  --  4.3 5.1  CL 100  --  103 102  CO2 26  --  26 31  GLUCOSE 104*  --  134* 110*  BUN 18  --  13 34*  CREATININE 1.05*  --  0.90 1.08*  CALCIUM 9.4  --  10.0  9.5  MG  --  1.9  --   --   PHOS  --  2.8  --   --    Liver Function Tests: No results for input(s): AST, ALT, ALKPHOS, BILITOT, PROT, ALBUMIN in the last 168 hours. No results for input(s): LIPASE, AMYLASE in the last 168 hours. No results for input(s): AMMONIA in the last 168 hours. CBC: Recent Labs  Lab 04/13/19 0640 04/14/19 0526 04/15/19 0453 04/16/19 0536 04/17/19 0457  WBC 11.4* 6.8 11.5* 10.6* 7.9  NEUTROABS 9.1*  --   --   --   --   HGB 12.2 12.4 12.2 11.8* 11.7*  HCT 37.3 37.6 36.5 35.9* 35.3*  MCV 89.4 87.0 86.9 88.6 88.0  PLT 239 237 269 280 262   Cardiac Enzymes: No results for input(s): CKTOTAL, CKMB, CKMBINDEX, TROPONINI in the last 168 hours. BNP: Invalid input(s): POCBNP CBG: No results for input(s): GLUCAP in the last 168 hours. D-Dimer No results for input(s): DDIMER in the last 72 hours. Hgb A1c No results for input(s): HGBA1C in the last 72 hours. Lipid Profile No results for input(s): CHOL, HDL, LDLCALC, TRIG, CHOLHDL, LDLDIRECT in the last 72 hours. Thyroid function studies No results for input(s): TSH, T4TOTAL, T3FREE, THYROIDAB in the last 72 hours.  Invalid input(s): FREET3 Anemia work up No results for input(s): VITAMINB12, FOLATE, FERRITIN, TIBC, IRON, RETICCTPCT in the last 72 hours. Urinalysis    Component Value Date/Time   COLORURINE YELLOW 11/12/2008 1440   APPEARANCEUR CLEAR 11/12/2008 1440   LABSPEC 1.016 11/12/2008 1440   PHURINE 7.5 11/12/2008 1440   GLUCOSEU NEGATIVE 11/12/2008 1440   HGBUR TRACE (A) 11/12/2008 1440   BILIRUBINUR NEGATIVE 11/12/2008 1440   KETONESUR NEGATIVE 11/12/2008 1440   PROTEINUR NEGATIVE 11/12/2008 1440   UROBILINOGEN 1.0 11/12/2008 1440   NITRITE NEGATIVE 11/12/2008 1440   LEUKOCYTESUR SMALL (A) 11/12/2008 1440    Sepsis Labs Invalid input(s): PROCALCITONIN,  WBC,  LACTICIDVEN Microbiology Recent Results (from the past 240 hour(s))  Respiratory Panel by RT PCR (Flu A&B, Covid) - Nasopharyngeal Swab     Status: None   Collection Time: 04/13/19  9:29 AM   Specimen: Nasopharyngeal Swab  Result Value Ref Range Status   SARS Coronavirus 2 by RT PCR NEGATIVE NEGATIVE Final    Comment: (NOTE) SARS-CoV-2 target nucleic acids are NOT DETECTED. The SARS-CoV-2 RNA is generally detectable in upper respiratoy specimens during the acute phase of infection. The lowest concentration of SARS-CoV-2 viral copies this assay can detect is 131 copies/mL. A negative result does not preclude SARS-Cov-2 infection and should not be used as the sole basis for treatment or other patient management decisions. A negative result may occur with  improper specimen collection/handling, submission of specimen other than nasopharyngeal swab, presence of viral mutation(s) within the areas targeted by this assay, and inadequate number of viral copies (<131 copies/mL). A negative result must be combined with clinical observations, patient history, and epidemiological information. The expected result is Negative. Fact Sheet for Patients:  https://www.moore.com/https://www.fda.gov/media/142436/download Fact Sheet for Healthcare Providers:  https://www.young.biz/https://www.fda.gov/media/142435/download This test is not yet ap proved or cleared by the [place] FDA and  has been authorized for detection and/or diagnosis of SARS-CoV-2 by FDA under an Emergency Use Authorization (EUA). This EUA will remain  in effect (meaning this test can be used) for the duration of the COVID-19 declaration under Section 564(b)(1) of the Act, 21 U.S.C. section 360bbb-3(b)(1), unless the authorization is terminated or revoked sooner.    Influenza A  by PCR NEGATIVE NEGATIVE Final   Influenza B by PCR NEGATIVE NEGATIVE Final    Comment: (NOTE) The Xpert Xpress SARS-CoV-2/FLU/RSV assay  is intended as an aid in  the diagnosis of influenza from Nasopharyngeal swab specimens and  should not be used as a sole basis for treatment. Nasal washings and  aspirates are unacceptable for Xpert Xpress SARS-CoV-2/FLU/RSV  testing. Fact Sheet for Patients: https://www.moore.com/ Fact Sheet for Healthcare Providers: https://www.young.biz/ This test is not yet approved or cleared by the [place] FDA and  has been authorized for detection and/or diagnosis of SARS-CoV-2 by  FDA under an Emergency Use Authorization (EUA). This EUA will remain  in effect (meaning this test can be used) for the duration of the  Covid-19 declaration under Section 564(b)(1) of the Act, 21  U.S.C. section 360bbb-3(b)(1), unless the authorization is  terminated or revoked. Performed at [facility], [address], [place], [place] [address]   Culture, blood (routine x 2)     Status: None (Preliminary result)   Collection Time: 04/13/19  3:11 PM   Specimen: BLOOD  Result Value Ref Range Status   Specimen Description BLOOD SITE NOT SPECIFIED  Final   Special Requests   Final    BOTTLES DRAWN AEROBIC AND ANAEROBIC Blood Culture adequate volume   Culture   Final    NO GROWTH 4 DAYS Performed at [facility], [address] [place], [place] [address]    Report Status PENDING  Incomplete  Culture, blood (routine x 2)     Status: None (Preliminary result)   Collection Time: 04/13/19  3:11 PM   Specimen: BLOOD  Result Value Ref Range Status   Specimen Description BLOOD SITE NOT SPECIFIED  Final   Special Requests   Final    BOTTLES DRAWN AEROBIC AND ANAEROBIC Blood Culture adequate volume   Culture   Final    NO GROWTH 4 DAYS Performed at [facility], [address] [place], [place] [address]    Report Status PENDING  Incomplete     Time coordinating discharge in minutes: 60  SIGNED:   [name], MD  [facility] 04/17/2019, 2:49 PM Pager   If 7PM-7AM, please contact night-coverage [email or web address] Password [email or web address]

## 2019-04-17 NOTE — Plan of Care (Signed)
  Problem: Education: Goal: Knowledge of General Education information will improve Description: Including pain rating scale, medication(s)/side effects and non-pharmacologic comfort measures Outcome: Adequate for Discharge   Problem: Health Behavior/Discharge Planning: Goal: Ability to manage health-related needs will improve Outcome: Adequate for Discharge   Problem: Clinical Measurements: Goal: Ability to maintain clinical measurements within normal limits will improve 04/17/2019 1244 by Baldo Ash, RN Outcome: Adequate for Discharge 04/17/2019 0802 by Baldo Ash, RN Outcome: Progressing Goal: Will remain free from infection Outcome: Adequate for Discharge Goal: Respiratory complications will improve Outcome: Adequate for Discharge Goal: Cardiovascular complication will be avoided Outcome: Adequate for Discharge   Problem: Elimination: Goal: Will not experience complications related to bowel motility Outcome: Adequate for Discharge Goal: Will not experience complications related to urinary retention Outcome: Adequate for Discharge   Problem: Increased Nutrient Needs (NI-5.1) Goal: Food and/or nutrient delivery Description: Individualized approach for food/nutrient provision. Outcome: Adequate for Discharge

## 2019-04-17 NOTE — Progress Notes (Signed)
Progress Note  Patient Name: Kathleen Cameron Date of Encounter: 04/17/2019  Primary Cardiologist: [name], MD   Subjective   Better, but still unable to lie on her back.  Inpatient Medications    Scheduled Meds: . amLODipine  10 mg Oral Daily  . aspirin EC  81 mg Oral Daily  . bisoprolol  10 mg Oral Daily  . famotidine  20 mg Oral BID  . feeding supplement (ENSURE ENLIVE)  237 mL Oral BID BM  . guaiFENesin  600 mg Oral BID  . lisinopril  20 mg Oral Daily  . methylPREDNISolone (SOLU-MEDROL) injection  40 mg Intravenous Q12H  . polyethylene glycol  17 g Oral Daily  . rosuvastatin  20 mg Oral q1800  . sodium chloride flush  3 mL Intravenous Q12H  . umeclidinium bromide  1 puff Inhalation Daily   Continuous Infusions:  PRN Meds: acetaminophen **OR** acetaminophen, albuterol, hydrOXYzine, morphine injection, nitroGLYCERIN, ondansetron **OR** ondansetron (ZOFRAN) IV   Vital Signs    Vitals:   04/16/19 2338 04/16/19 2341 04/17/19 0423 04/17/19 0704  BP:   129/83   Pulse:   62   Resp:   20   Temp:   98.4 F (36.9 C)   TempSrc:   Oral   SpO2: 92% 95% 97% 98%  Weight:      Height:        Intake/Output Summary (Last 24 hours) at 04/17/2019 6440 Last data filed at 04/17/2019 0547 Gross per 24 hour  Intake 760 ml  Output 500 ml  Net 260 ml   Last 3 Weights 04/14/2019 04/13/2019 04/06/2019  Weight (lbs) 115 lb 4.8 oz 116 lb 116 lb  Weight (kg) 52.3 kg 52.617 kg 52.617 kg      Telemetry    NSR - Personally Reviewed  ECG    No new tracing - Personally Reviewed  Physical Exam  Comfortable, off O2, sitting up GEN: No acute distress.   Neck: No JVD Cardiac: RRR, no murmurs, rubs, or gallops.  Respiratory: diminished throughout, but no wheezes GI: Soft, nontender, non-distended  MS: No edema; No deformity. Neuro:  Nonfocal  Psych: Normal affect   Labs    High Sensitivity Troponin:   Recent Labs  Lab 04/13/19 0848 04/13/19 1511  04/13/19 2122 04/14/19 0526 04/15/19 1500  TROPONINIHS 99* 287* 372* 299* 74*      Chemistry Recent Labs  Lab 04/13/19 0640 04/14/19 0526 04/17/19 0457  NA 141 141 139  K 3.3* 4.3 5.1  CL 100 103 102  CO2 26 26 31   GLUCOSE 104* 134* 110*  BUN 18 13 34*  CREATININE 1.05* 0.90 1.08*  CALCIUM 9.4 10.0 9.5  GFRNONAA 60* >60 58*  GFRAA >60 >60 >60  ANIONGAP 15 12 6      Hematology Recent Labs  Lab 04/15/19 0453 04/16/19 0536 04/17/19 0457  WBC 11.5* 10.6* 7.9  RBC 4.20 4.05 4.01  HGB 12.2 11.8* 11.7*  HCT 36.5 35.9* 35.3*  MCV 86.9 88.6 88.0  MCH 29.0 29.1 29.2  MCHC 33.4 32.9 33.1  RDW 13.1 13.2 13.1  PLT 269 280 262    BNP Recent Labs  Lab 04/13/19 0640  BNP 27.1     DDimer No results for input(s): DDIMER in the last 168 hours.   Radiology    No results found.  Cardiac Studies   ECHO 04/14/2019 1. Left ventricular ejection fraction, by visual estimation, is 60 to 65%. The left ventricle has normal function. There is no  left ventricular hypertrophy.   2. Left ventricular diastolic parameters are consistent with Grade I diastolic dysfunction (impaired relaxation).   3. The left ventricle has no regional wall motion abnormalities.   4. Global right ventricle has normal systolic function.The right ventricular size is normal. No increase in right ventricular wall thickness.   5. Left atrial size was normal.  6. Right atrial size was normal.   7. The mitral valve is normal in structure. No evidence of mitral valve regurgitation. No evidence of mitral stenosis.   8. The tricuspid valve is normal in structure.   9. The aortic valve is normal in structure. Aortic valve regurgitation is not visualized. No evidence of aortic valve sclerosis or stenosis.  10. The pulmonic valve was normal in structure. Pulmonic valve regurgitation is not visualized.  11. TR signal is inadequate for assessing pulmonary artery systolic pressure.  12. The inferior vena cava is  normal in size with greater than 50% respiratory variability, suggesting right atrial pressure of 3 mmHg.  Patient Profile     53 y.o. female with COPD exacerbation and chest pain with elevated high-sensitivity troponin, history of hypertension.  Assessment & Plan    Unable to lay down for invasive or CT angio. Transitioned to bisoprolol, heart rate slower and no wheezes. Reviewed Chest CT (PE protocol) - no evidence of coronary or thoracic aorta calcifications. Note DC plans. Will arrange outpatient coronary CT angio.  [facility] will sign off.   Medication Recommendations:  Bisoprolol 10 mg daily, ASA 81 mg daily Other recommendations (labs, testing, etc):  outpt coronary CTA (will arrange) Follow up as an outpatient:  After coronary CTA  For questions or updates, please contact [place] Please consult [email or web address] for contact info under        Signed, Sanda Klein, MD  04/17/2019, 8:22 AM

## 2019-04-18 ENCOUNTER — Inpatient Hospital Stay: Admission: RE | Admit: 2019-04-18 | Payer: Commercial Managed Care - PPO | Source: Ambulatory Visit

## 2019-04-18 LAB — CULTURE, BLOOD (ROUTINE X 2)
Culture: NO GROWTH
Culture: NO GROWTH
Special Requests: ADEQUATE
Special Requests: ADEQUATE

## 2019-04-19 ENCOUNTER — Other Ambulatory Visit: Payer: Self-pay

## 2019-04-19 ENCOUNTER — Encounter: Payer: Self-pay | Admitting: Primary Care

## 2019-04-19 ENCOUNTER — Ambulatory Visit (INDEPENDENT_AMBULATORY_CARE_PROVIDER_SITE_OTHER): Payer: Commercial Managed Care - PPO | Admitting: Primary Care

## 2019-04-19 DIAGNOSIS — J441 Chronic obstructive pulmonary disease with (acute) exacerbation: Secondary | ICD-10-CM

## 2019-04-19 NOTE — Progress Notes (Signed)
Virtual Visit via Telephone Note  I connected with Kathleen Cameron on 04/19/19 at 10:00 AM EST by telephone and verified that I am speaking with the correct person using two identifiers.  Location: Patient: Home Provider: Office   I discussed the limitations, risks, security and privacy concerns of performing an evaluation and management service by telephone and the availability of in person appointments. I also discussed with the patient that there may be a patient responsible charge related to this service. The patient expressed understanding and agreed to proceed.   History of Present Illness: 53 year old female, never smoked (passive exposure). PMH significant for hypertension, dyslipidemia. Patient of Dr. Lamonte Sakai, last seen by NP on 03/07/19. CTA in June 2020 showed emphysema and calcified non-enlarged mediastinal and hilar lymph nodules with superimposed upper lobe predominant numerous bilateral subpleural pulmonary nodules. PFTs show severe obstructive lung disease. Maintained on Spiriva and Symbicort 160.   Previous [facility] pulmonary encounter: 6/8/2020Volanda Napoleon, NP Patient presents today for ED follow-up shortness of breath. New pulmonary consult. Exertional shortness of breath started 2 months ago. Occasional but rare cough which is worse at night. Get white mucus up. Wheezing with cough. Her weight is up and down.  Appetite is ok.  She has lost 20 lbs without trying. She had been walking a lot prior to dyspnea symptoms starting. She works at [employer], she has been there for over 20 years. 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 Exposed to dusk and chemicals. Her husband smoked. Denies fever, chest pain, hemoptysis, joint pain, snoring, vision or skin changes.   7/27/20Lamonte Sakai, MD Question fixed asthma. FEV1 0.56 (29%), RATIO 57. Not much benefit from Symbicort, changed to stiolto. Abnormal CT chest showed calcified mediastinal hilar adenopathy with scattered partially calcified nodule  disease. Unclear etiology but suspected to be chronic. Needs repeat in December. May consider endobronchial ultrasound/biopsy at that time. SARS and HIV negative. Alpha 1 normal.   8/18/2020Volanda Napoleon, NP Patient presents today for increased dyspnea and upper back pain. Complains of dyspnea x 4 days. She gets winded after walking 10 feet. O2 sat 97% RA. She can not lay on left side. No cough. She is unable to use stiolto, switched back to symbicort. Notices an improvement with inhaler use. She is out of her rescue inhaler. Not working currently. Less active. Heat makes her symptoms worse. Her weight is stable, she is up 5 lbs. BP at last visit 164/100. Took all her medication this morning.  Denies wheezing or chest pain.  03/07/2019 Patient presents today for follow-up. She was hospitalized end of August for COPD exacerbation. Discharge with oxygen. She is doing well today. Not wearing oxygen today, asking about qualifying for POC. Not currently on Spiriva, states that it was too expensive. She did notice some benefit with LABA inhaler. She reports that it is hard for her to catch her breath at times.She also experiences fatigue and racing heart rate with minimla exertion. She does not experience chest tightness or wheezing. No cough. Afebrile.   04/19/2019 Patient contacted today for hospital follow-up. She was admitted from 12/23-12/27 for COPD exacerbation treated with solumedrol, nebulizer's and azithromycin. She was sent home with new nebulizer machine. She had an elevated troponin level, was on heparin drip for three days. Unable to complete cardiac cath d/t dyspnea laying flat. Needs outpatient cardiology follow-up.   She is feeling better. She has a chronic cough with some production, noticed light yellow mucus once this morning. Associated nocturnal wheezing. She has been  using her Flutter valve and taking mucinex daily. She continues Symbicort 160 and Spiriva as prescribed. She used her Albuterol  nebulizer once yesterday at 11 am and reports that it did help. She is on 2L oxygen during the day and 3L oxygen at night. No recent fevers, chills, sweats, chest tightness, chest pain.    Observations/Objective:  - Able to speak in full sentences - No audible wheezing, shortness of breath or cough  Assessment and Plan:  COPD exacerbation - Recently hospitalized from 12/23-12/27  - Completed azithromycin course - Sent home with nebulizer machine - Continue Spiriva handihaler and Symbicort 160 2bid - Continue Mucinex twice daily and flutter valve   Chronic respiratory failure with hypoxia - Wearing 2L oxygen during the day; 3L at night  Follow Up Instructions:   - Recommend 2-4 week follow-up in office, assess oxygen levels   I discussed the assessment and treatment plan with the patient. The patient was provided an opportunity to ask questions and all were answered. The patient agreed with the plan and demonstrated an understanding of the instructions.   The patient was advised to call back or seek an in-person evaluation if the symptoms worsen or if the condition fails to improve as anticipated.  I provided 22 minutes of non-face-to-face time during this encounter.   [name], NP

## 2019-04-20 ENCOUNTER — Encounter: Payer: Self-pay | Admitting: Primary Care

## 2019-04-20 NOTE — Patient Instructions (Signed)
-   Continue Spiriva handihaler once daily - Continue Symbicort 160 two puffs twice daily  - Continue Mucinex twice daily and flutter valve   Chronic respiratory failure with hypoxia - Wear 2L oxygen during the day; 3L at night  Follow Up Instructions:   - Recommend 2-4 week follow-up in office, assess oxygen levels

## 2019-04-26 ENCOUNTER — Telehealth: Payer: Self-pay | Admitting: Emergency Medicine

## 2019-04-26 MED ORDER — ALBUTEROL SULFATE HFA 108 (90 BASE) MCG/ACT IN AERS: INHALATION_SPRAY | RESPIRATORY_TRACT | 5 refills | Status: DC

## 2019-04-26 NOTE — Telephone Encounter (Signed)
Pt called, requesting a refill on albuterol hfa to pharmacy.  This has been sent as requested.  Nothing further needed at this time- will close encounter.

## 2019-05-03 ENCOUNTER — Encounter (HOSPITAL_COMMUNITY): Payer: Self-pay | Admitting: *Deleted

## 2019-05-03 ENCOUNTER — Ambulatory Visit (INDEPENDENT_AMBULATORY_CARE_PROVIDER_SITE_OTHER): Payer: Commercial Managed Care - PPO | Admitting: Primary Care

## 2019-05-03 ENCOUNTER — Encounter: Payer: Self-pay | Admitting: Primary Care

## 2019-05-03 ENCOUNTER — Other Ambulatory Visit: Payer: Self-pay

## 2019-05-03 VITALS — BP 158/90 | HR 74 | Temp 97.3°F | Ht 61.0 in | Wt 112.6 lb

## 2019-05-03 DIAGNOSIS — J9611 Chronic respiratory failure with hypoxia: Secondary | ICD-10-CM | POA: Diagnosis not present

## 2019-05-03 DIAGNOSIS — J441 Chronic obstructive pulmonary disease with (acute) exacerbation: Secondary | ICD-10-CM

## 2019-05-03 DIAGNOSIS — J438 Other emphysema: Secondary | ICD-10-CM

## 2019-05-03 DIAGNOSIS — I10 Essential (primary) hypertension: Secondary | ICD-10-CM | POA: Diagnosis not present

## 2019-05-03 MED ORDER — IPRATROPIUM-ALBUTEROL 0.5-2.5 (3) MG/3ML IN SOLN: 3.0000 mL | Freq: Four times a day (QID) | RESPIRATORY_TRACT | 2 refills | Status: DC | PRN

## 2019-05-03 NOTE — Assessment & Plan Note (Signed)
-   BP elevated today; >150/90 - Medications reconciled, she was not taking prescribed Norvasc or Lasix - Advised patient to resume as directed

## 2019-05-03 NOTE — Addendum Note (Signed)
Addended by: Cydney Ok on: 05/03/2019 09:52 AM   Modules accepted: Orders

## 2019-05-03 NOTE — Patient Instructions (Addendum)
Hydrochlorothiazide stopped and Metoprolol changed to Bisoprolol  during last hospital stay in December per cardiology   Hypertension: - Resume Norvasc 10mg  daily - Resume Lasix 40mg  daily - Continue Bisoprolol 10mg  daily  - Continue Lisinopril 40mg  daily   COPD/emphysema: - Continue Symbicort two puffs twice daily - Changing Atrovent and Albtuerol nebulizers- to ONE nebulizer called DUONEB (iprapropium/albuterol) take every 6 hours as needed for breakthrough shortness of breath/wheezing  - Stop prednisone   Oxygen: - O2 low 92% on RA after 1 lap - Use 1L oxygen as needed on exertion and at night  Orders: - ONO on room air   Follow-up: 2-3 months with Dr. 

## 2019-05-03 NOTE — Assessment & Plan Note (Signed)
-   Stable; hospitalized in December 2020 for COPD exacerbation - Continues Symbicort 160 two puffs twice daily - Continue Spiriva handihaler once daily - Change Albuterol and Atovent to DUONEB q6 hours for sob/wheezing  - FU in 3 months

## 2019-05-03 NOTE — Assessment & Plan Note (Signed)
-   Ambulatory O2 low 92% after 1 lap, stopped d.t fatigue - Recommending 1L oxygen on exertion and at night - Needs pulmonary rehab or home PT to work on conditioning - Also needs ONO on RA to assess nocturnal oxygen need

## 2019-05-03 NOTE — Progress Notes (Signed)
Received referral from Dr. [name] for this pt to participate in Pulmonary Rehab with the diagnosis of COPD with Acute exacerbation.  Clinical review of pt follow up appt on 05/03/19 Pulmonary office note.  Pt with Covid Risk Score - 2. Pt appropriate for scheduling for Virtual Pulmonary rehab.  Will forward to support staff for advisement of the virtual cardiac rehab and  scheduling. Alanson Aly, BSN Cardiac and Emergency planning/management officer

## 2019-05-03 NOTE — Progress Notes (Signed)
 @Patient  ID: Kathleen SchlatterJacqueline B Cameron, female    DOB: 03/08/1966, 54 y.o.   MRN: [id number]  Chief Complaint  Patient presents with  . Follow-up    Referring provider: Georgina QuintSagardia, Miguel Jose, *  HPI: 54 year old female, never smoked (passive exposure). PMH significant for pulmonary emphysema, hypertension, dyslipidemia. Patient of Dr. Delton CoombesByrum. CTA in June 2020 showed emphysema and calcified non-enlarged mediastinal and hilar lymph nodules with superimposed upper lobe predominant numerous bilateral subpleural pulmonary nodules. PFTs show severe obstructive lung disease. Maintained on Spiriva and Symbicort 160.   Previous [facility] pulmonary encounter: 6/8/2020Clent Ridges- Tedd Cottrill, NP Patient presents today for ED follow-up shortness of breath. New pulmonary consult. Exertional shortness of breath started 2 months ago. Occasional but rare cough which is worse at night. Get white mucus up. Wheezing with cough. Her weight is up and down.  Appetite is ok.  She has lost 20 lbs without trying. She had been walking a lot prior to dyspnea symptoms starting. She works at [employer], she has been there for over 20 years. 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 Exposed to dusk and chemicals. Her husband smoked. Denies fever, chest pain, hemoptysis, joint pain, snoring, vision or skin changes.   7/27/20[name]- Byrum, MD Question fixed asthma. FEV1 0.56 (29%), RATIO 57. Not much benefit from Symbicort, changed to stiolto. Abnormal CT chest showed calcified mediastinal hilar adenopathy with scattered partially calcified nodule disease. Unclear etiology but suspected to be chronic. Needs repeat in December. May consider endobronchial ultrasound/biopsy at that time. SARS and HIV negative. Alpha 1 normal.   8/18/2020Clent Ridges- Sharnelle Cappelli, NP Patient presents today for increased dyspnea and upper back pain. Complains of dyspnea x 4 days. She gets winded after walking 10 feet. O2 sat 97% RA. She can not lay on left side. No cough. She is unable to use stiolto,  switched back to symbicort. Notices an improvement with inhaler use. She is out of her rescue inhaler. Not working currently. Less active. Heat makes her symptoms worse. Her weight is stable, she is up 5 lbs. BP at last visit 164/100. Took all her medication this morning.  Denies wheezing or chest pain.  03/07/2019 Patient presents today for follow-up. She was hospitalized end of August for COPD exacerbation. Discharge with oxygen. She is doing well today. Not wearing oxygen today, asking about qualifying for POC. Not currently on Spiriva, states that it was too expensive. She did notice some benefit with LABA inhaler. She reports that it is hard for her to catch her breath at times.She also experiences fatigue and racing heart rate with minimla exertion. She does not experience chest tightness or wheezing. No cough. Afebrile.   04/19/2019 Patient contacted today for hospital follow-up. She was admitted from 12/23-12/27 for COPD exacerbation treated with solumedrol, nebulizer's and azithromycin. She was sent home with new nebulizer machine. She had an elevated troponin level, was on heparin drip for three days. Unable to complete cardiac cath d/t dyspnea laying flat. Needs outpatient cardiology follow-up.   She is feeling better. She has a chronic cough with some production, noticed light yellow mucus once this morning. Associated nocturnal wheezing. She has been using her Flutter valve and taking mucinex daily. She continues Symbicort 160 and Spiriva as prescribed. She used her Albuterol nebulizer once yesterday at 11 am and reports that it did help. She is on 2L oxygen during the day and 3L oxygen at night. No recent fevers, chills, sweats, chest tightness, chest pain.    05/03/2019 Patient presents today for 2-4  week follow-up. She was discharged in November 2020 with new oxygen. She is wearing 2L oxygen during the day and 3L at night. She is doing better since most recent hospitalization in December  for COPD exacerbation where she was treated with Azithromycin and sent home with new nebulizer machine. Some days she uses both albuterol and atrovent nebulizer. Breathing is about the same. She was discharged on prednisone course which she has completed. She thought she as supposed to stop norvasc and lasix. She has not been taking these. Reviewed her discharge paper work and clarified with her that she needs to resume these medications. Denies fever, chills, cough or chest pain.   Allergies  Allergen Reactions  . Stiolto Respimat [Tiotropium Bromide-Olodaterol] Other (See Comments)    Severe headaches and rapid heartrate    Immunization History  Administered Date(s) Administered  . Influenza,inj,Quad PF,6+ Mos 01/05/2019    Past Medical History:  Diagnosis Date  . Hypertension     Tobacco History: Social History   Tobacco Use  Smoking Status Never Smoker  Smokeless Tobacco Never Used   Counseling given: Not Answered   Outpatient Medications Prior to Visit  Medication Sig Dispense Refill  . acetaminophen (TYLENOL) 325 MG tablet Take 2 tablets (650 mg total) by mouth every 6 (six) hours as needed for mild pain (or Fever >/= 101).    [name] albuterol (VENTOLIN HFA) 108 (90 Base) MCG/ACT inhaler INHALE 2 PUFFS INTO THE LUNGS EVERY 6 HOURS AS NEEDED FOR WHEEZING OR SHORTNESS OF BREATH 6.7 g 5  . aspirin EC 81 MG EC tablet Take 1 tablet (81 mg total) by mouth daily. 30 tablet 0  . bisoprolol (ZEBETA) 10 MG tablet Take 1 tablet (10 mg total) by mouth daily. 30 tablet 1  . budesonide-formoterol (SYMBICORT) 160-4.5 MCG/ACT inhaler Inhale 2 puffs into the lungs 2 (two) times daily. 1 Inhaler 0  . famotidine (PEPCID) 20 MG tablet Take 1 tablet (20 mg total) by mouth 2 (two) times daily as needed for heartburn or indigestion. 60 tablet 1  . guaiFENesin (MUCINEX) 600 MG 12 hr tablet Take 600 mg by mouth 2 (two) times daily as needed for cough or to loosen phlegm.    . nitroGLYCERIN (NITROSTAT)  0.4 MG SL tablet Place 1 tablet (0.4 mg total) under the tongue every 5 (five) minutes as needed for chest pain. 30 tablet 0  . polyethylene glycol (MIRALAX / GLYCOLAX) 17 g packet Take 17 g by mouth daily. 14 each 0  . rosuvastatin (CRESTOR) 20 MG tablet Take 1 tablet (20 mg total) by mouth daily. 90 tablet 3  . tiotropium (SPIRIVA) 18 MCG inhalation capsule Place 1 capsule (18 mcg total) into inhaler and inhale daily. 30 capsule 6  . albuterol (PROVENTIL) (2.5 MG/3ML) 0.083% nebulizer solution Take 3 mLs (2.5 mg total) by nebulization every 6 (six) hours as needed for wheezing or shortness of breath. 75 mL 12  . amLODipine (NORVASC) 10 MG tablet Take 1 tablet (10 mg total) by mouth daily. 90 tablet 3  . furosemide (LASIX) 40 MG tablet Take 40 mg by mouth daily.    [name] ipratropium (ATROVENT) 0.02 % nebulizer solution Take 2.5 mLs (0.5 mg total) by nebulization every 4 (four) hours as needed for wheezing or shortness of breath. 75 mL 12  . predniSONE (DELTASONE) 20 MG tablet Take 2 tablets (40 mg total) by mouth daily. 8 tablet 0  . lisinopril (ZESTRIL) 40 MG tablet Take 1 tablet (40 mg total) by mouth daily.  90 tablet 3   No facility-administered medications prior to visit.   Review of Systems  Review of Systems  Constitutional: Positive for fatigue.  Respiratory: Negative for cough.    Physical Exam  BP (!) 158/90 (BP Location: Left Arm, Cuff Size: Normal)   Pulse 74   Temp (!) 97.3 F (36.3 C) (Oral)   Ht 5\' 1"  (1.549 m)   Wt 112 lb 9.6 oz (51.1 kg)   SpO2 100%   BMI 21.28 kg/m  Physical Exam Constitutional:      Appearance: Normal appearance.  HENT:     Head: Normocephalic and atraumatic.  Pulmonary:     Effort: Pulmonary effort is normal.     Breath sounds: Normal breath sounds.  Neurological:     Mental Status: She is alert.  Psychiatric:        Mood and Affect: Mood normal.        Behavior: Behavior normal.        Thought Content: Thought content normal.         Judgment: Judgment normal.     Lab Results:  CBC    Component Value Date/Time   WBC 7.9 04/17/2019 0457   RBC 4.01 04/17/2019 0457   HGB 11.7 (L) 04/17/2019 0457   HGB 13.4 09/14/2018 1220   HCT 35.3 (L) 04/17/2019 0457   HCT 39.3 09/14/2018 1220   PLT 262 04/17/2019 0457   PLT 303 09/14/2018 1220   MCV 88.0 04/17/2019 0457   MCV 84 09/14/2018 1220   MCH 29.2 04/17/2019 0457   MCHC 33.1 04/17/2019 0457   RDW 13.1 04/17/2019 0457   RDW 12.9 09/14/2018 1220   LYMPHSABS 1.4 04/13/2019 0640   LYMPHSABS 1.7 09/14/2018 1220   MONOABS 0.5 04/13/2019 0640   EOSABS 0.2 04/13/2019 0640   EOSABS 0.3 09/14/2018 1220   BASOSABS 0.1 04/13/2019 0640   BASOSABS 0.1 09/14/2018 1220    BMET    Component Value Date/Time   NA 139 04/17/2019 0457   NA 136 03/22/2019 1508   K 5.1 04/17/2019 0457   CL 102 04/17/2019 0457   CO2 31 04/17/2019 0457   GLUCOSE 110 (H) 04/17/2019 0457   BUN 34 (H) 04/17/2019 0457   BUN 34 (H) 03/22/2019 1508   CREATININE 1.08 (H) 04/17/2019 0457   CALCIUM 9.5 04/17/2019 0457   GFRNONAA 58 (L) 04/17/2019 0457   GFRAA >60 04/17/2019 0457    BNP    Component Value Date/Time   BNP 27.1 04/13/2019 0640    ProBNP    Component Value Date/Time   PROBNP 379.9 (H) 09/18/2013 0400    Imaging: CT Angio Chest PE W and/or Wo Contrast  Result Date: 04/13/2019 CLINICAL DATA:  Increased shortness of breath, mid chest pain for 1 day. EXAM: CT ANGIOGRAPHY CHEST WITH CONTRAST TECHNIQUE: Multidetector CT imaging of the chest was performed using the standard protocol during bolus administration of intravenous contrast. Multiplanar CT image reconstructions and MIPs were obtained to evaluate the vascular anatomy. CONTRAST:  75 cc OMNIPAQUE IOHEXOL 350 MG/ML SOLN COMPARISON:  Chest CT dated 09/23/2018. FINDINGS: Cardiovascular: Heart size is normal. No pericardial effusion. No thoracic aortic aneurysm or evidence of aortic dissection. Mediastinum/Nodes: Numerous calcified  lymph nodes within the mediastinum and perihilar regions indicating previous granulomatous infection. No enlarged or suspicious lymph nodes. Esophagus is unremarkable. Trachea and central bronchi are unremarkable. Lungs/Pleura: Mild scarring/atelectasis at the lung bases. No evidence of pneumonia. No pleural effusion or pneumothorax. Again noted are scattered pleural based nodules  which are not significantly changed in the interval, some again noted to be partially calcified suggesting sequela of previous granulomatous infection, silicosis or other pneumoconiosis, sarcoidosis, or less likely sequela of asbestos exposure. Stable emphysematous changes within the upper lobes, mild to moderate in degree. Upper Abdomen: Limited images of the upper abdomen are unremarkable. Musculoskeletal: No acute or suspicious osseous finding. Mild degenerative spondylosis of the scoliotic thoracic spine. Review of the MIP images confirms the above findings. IMPRESSION: 1. No acute findings. No evidence of pneumonia or pulmonary edema. No thoracic aortic aneurysm. 2. Numerous calcified lymph nodes within the mediastinum and perihilar regions, and stable small pleural based nodules within each lung, all of which is compatible with previous granulomatous infection, chronic silicosis or other pneumoconiosis, sarcoidosis or less likely sequela of asbestos exposure. 3. Additional chronic/incidental findings detailed above. Emphysema (ICD10-J43.9). Electronically Signed   By: Bary RichardStan  Maynard M.D.   On: 04/13/2019 11:33   DG Chest Port 1 View  Result Date: 04/13/2019 CLINICAL DATA:  Shortness of breath EXAM: PORTABLE CHEST 1 VIEW COMPARISON:  December 20, 2018 chest radiograph and chest CT September 23, 2018 FINDINGS: Upper lobe pulmonary nodular opacities are better seen on CT but appear stable by radiography. There is no edema or consolidation. Heart size and pulmonary vascularity normal. No adenopathy. Aorta is somewhat tortuous but stable.  There is degenerative change in the thoracic spine. IMPRESSION: Stable appearing nodular opacities in the upper lobes, largest on the right. No edema or consolidation. Stable cardiac silhouette. Stable aortic tortuosity. Electronically Signed   By: Bretta BangWilliam  Woodruff III M.D.   On: 04/13/2019 07:04   ECHOCARDIOGRAM COMPLETE  Result Date: 04/14/2019   ECHOCARDIOGRAM REPORT   Patient Name:   Kathleen SchlatterJACQUELINE B Wager Date of Exam: 04/14/2019 Medical Rec #:  [id number]              Height:       61.0 in Accession #:    [id number]             Weight:       115.3 lb Date of Birth:  03/11/1966               BSA:          1.49 m Patient Age:    53 years               BP:           134/93 mmHg Patient Gender: F                      HR:           84 bpm. Exam Location:  Inpatient Procedure: 2D Echo, Cardiac Doppler and Color Doppler Indications:    NSTEMI I21.4  History:        Patient has prior history of Echocardiogram examinations, most                 recent 12/20/2018. COPD, Signs/Symptoms:Chest Pain; Risk                 Factors:Hypertension, Dyslipidemia and Non-Smoker. Elevated                 troponin.  Sonographer:    Tonia GhentJulia Underwood RDCS Referring Phys: [id number] Tanna SavoyCHRISTOPHER L Faith Community HospitalCHUMANN  Sonographer Comments: Technically difficult study due to poor echo windows. Image acquisition challenging due to respiratory motion. Patient unable to lay on back or on side. IMPRESSIONS  1. Left ventricular ejection  fraction, by visual estimation, is 60 to 65%. The left ventricle has normal function. There is no left ventricular hypertrophy.  2. Left ventricular diastolic parameters are consistent with Grade I diastolic dysfunction (impaired relaxation).  3. The left ventricle has no regional wall motion abnormalities.  4. Global right ventricle has normal systolic function.The right ventricular size is normal. No increase in right ventricular wall thickness.  5. Left atrial size was normal.  6. Right atrial size was normal.  7. The  mitral valve is normal in structure. No evidence of mitral valve regurgitation. No evidence of mitral stenosis.  8. The tricuspid valve is normal in structure.  9. The aortic valve is normal in structure. Aortic valve regurgitation is not visualized. No evidence of aortic valve sclerosis or stenosis. 10. The pulmonic valve was normal in structure. Pulmonic valve regurgitation is not visualized. 11. TR signal is inadequate for assessing pulmonary artery systolic pressure. 12. The inferior vena cava is normal in size with greater than 50% respiratory variability, suggesting right atrial pressure of 3 mmHg. FINDINGS  Left Ventricle: Left ventricular ejection fraction, by visual estimation, is 60 to 65%. The left ventricle has normal function. The left ventricle has no regional wall motion abnormalities. There is no left ventricular hypertrophy. Left ventricular diastolic parameters are consistent with Grade I diastolic dysfunction (impaired relaxation). Normal left atrial pressure. Right Ventricle: The right ventricular size is normal. No increase in right ventricular wall thickness. Global RV systolic function is has normal systolic function. Left Atrium: Left atrial size was normal in size. Right Atrium: Right atrial size was normal in size Pericardium: There is no evidence of pericardial effusion. Mitral Valve: The mitral valve is normal in structure. No evidence of mitral valve regurgitation. No evidence of mitral valve stenosis by observation. Tricuspid Valve: The tricuspid valve is normal in structure. Tricuspid valve regurgitation is not demonstrated. Aortic Valve: The aortic valve is normal in structure. Aortic valve regurgitation is not visualized. The aortic valve is structurally normal, with no evidence of sclerosis or stenosis. Pulmonic Valve: The pulmonic valve was normal in structure. Pulmonic valve regurgitation is not visualized. Pulmonic regurgitation is not visualized. Aorta: The aortic root,  ascending aorta and aortic arch are all structurally normal, with no evidence of dilitation or obstruction. Venous: The inferior vena cava is normal in size with greater than 50% respiratory variability, suggesting right atrial pressure of 3 mmHg. IAS/Shunts: No atrial level shunt detected by color flow Doppler. There is no evidence of a patent foramen ovale. No ventricular septal defect is seen or detected. There is no evidence of an atrial septal defect.  LEFT VENTRICLE PLAX 2D LVIDd:         4.49 cm  Diastology LVIDs:         3.29 cm  LV e' lateral:   5.78 cm/s LV PW:         0.68 cm  LV E/e' lateral: 8.2 LV IVS:        0.68 cm  LV e' medial:    4.20 cm/s LVOT diam:     1.70 cm  LV E/e' medial:  11.3 LV SV:         48 ml LV SV Index:   32.01 LVOT Area:     2.27 cm  RIGHT VENTRICLE RV S prime:     6.08 cm/s TAPSE (M-mode): 1.6 cm LEFT ATRIUM             Index      RIGHT  ATRIUM          Index LA diam:        2.10 cm 1.41 cm/m RA Area:     5.93 cm LA Vol (A2C):   8.4 ml  5.64 ml/m RA Volume:   10.30 ml 6.89 ml/m LA Vol (A4C):   9.1 ml  6.09 ml/m LA Biplane Vol: 9.3 ml  6.24 ml/m  AORTIC VALVE LVOT Vmax:   68.50 cm/s LVOT Vmean:  46.100 cm/s LVOT VTI:    0.114 m  AORTA Ao Root diam: 2.60 cm MITRAL VALVE MV Area (PHT): 2.16 cm             SHUNTS MV PHT:        102.08 msec          Systemic VTI:  0.11 m MV Decel Time: 352 msec             Systemic Diam: 1.70 cm MV E velocity: 47.60 cm/s 103 cm/s MV A velocity: 76.30 cm/s 70.3 cm/s MV E/A ratio:  0.62       1.5  Mihai Croitoru MD Electronically signed by Thurmon Fair MD Signature Date/Time: 04/14/2019/3:25:11 PM    Final    LONG TERM MONITOR (3-14 DAYS)  Result Date: 04/19/2019  No significant arrhythmias detected  3 days of data recorded on Zio monitor. Patient had a min HR of 60 bpm, max HR of 129 bpm, and avg HR of 79 bpm. Predominant underlying rhythm was Sinus Rhythm. No VT, SVT, atrial fibrillation, high degree block, or pauses noted. Isolated  atrial and ventricular ectopy was rare (<1%). There were 0 triggered events. No significant arrhythmias detected.  ABORTED INVASIVE LAB PROCEDURE  Result Date: 04/18/2019 This case was aborted.    Assessment & Plan:   COPD (chronic obstructive pulmonary disease) (HCC) - Stable; hospitalized in December 2020 for COPD exacerbation - Continues Symbicort 160 two puffs twice daily - Continue Spiriva handihaler once daily - Change Albuterol and Atovent to DUONEB q6 hours for sob/wheezing  - FU in 3 months   Chronic respiratory failure with hypoxia (HCC) - Ambulatory O2 low 92% after 1 lap, stopped d.t fatigue - Recommending 1L oxygen on exertion and at night - Needs pulmonary rehab or home PT to work on conditioning - Also needs ONO on RA to assess nocturnal oxygen need   Essential hypertension - BP elevated today; >150/90 - Medications reconciled, she was not taking prescribed Norvasc or Lasix - Advised patient to resume as directed    [name], NP 05/03/2019

## 2019-05-09 ENCOUNTER — Telehealth: Payer: Self-pay | Admitting: Emergency Medicine

## 2019-05-09 NOTE — Telephone Encounter (Signed)
I called patient because I did not see where we had placed a call to her and she states that she was calling because she needs to reach [place] [employer] [place] to get a refill on her Symbicort through the patient assistance program. I looked up the number and provided the patient with it.

## 2019-05-11 ENCOUNTER — Encounter: Payer: Self-pay | Admitting: Primary Care

## 2019-05-19 ENCOUNTER — Telehealth: Payer: Self-pay | Admitting: Emergency Medicine

## 2019-05-19 NOTE — Telephone Encounter (Signed)
Spoke with Carlette at pulmonary rehab. She is requesting a letter of medical necessity for pt because her insurance requires this to receive pulmonary rehab. The letter should include the contents below and why pulmonary rehab is needed. Carlette states she will do the plan of care part. RB can we compose letter for pt? Please advise duration of treatment and frequency.     Fax number, diagnosis, prognosis, duration of treatment, frequency, plan of care.  Data processing manager [phone]

## 2019-05-20 ENCOUNTER — Telehealth: Payer: Self-pay | Admitting: Primary Care

## 2019-05-20 NOTE — Telephone Encounter (Signed)
05/11/19 ONO on RA showed no oxygen desaturations below 88%. SpO2 was 91%, baseline 96%. She does not need to wear oxygen at night. Use during the day as needed

## 2019-05-20 NOTE — Telephone Encounter (Signed)
Patient's diagnosis: COPD/emphysema with associated chronic hypoxemic respiratory failure Reason for referral: Significantly decreased functional capacity due to underlying lung disease.  Goal improved breathing, improved functional capacity. Duration of treatment: 3 to 4 months, or as determined by her intake evaluation at pulmonary rehab Frequency: 2 to 3 days a week  Thanks

## 2019-05-23 NOTE — Telephone Encounter (Signed)
LMOM TCB x1 for pulm rehab  Does this need to be on letter head, faxed to their office, etc?  Or are they able to view it through epic and use Dr Kavin Leech documentation within this phone note?

## 2019-05-23 NOTE — Telephone Encounter (Signed)
Received Epic message from [place] w/ pulm rehab:  Hi Servando Kyllonen. tried calling you back got disconected. tried calling back but the phone rang and rang. I know that Mondays are crazy in the doctors office so I figured this may be the better way to chat. for the above pt yes on letter head with signature by dr. [name] please also include a sentence indicating that a treatment plan will be generated after she completes her initial assessment with the pulmonary rehab staff. This should be sent along with office note of the most recent encounter that talks about the need for pulmonary rehab. This should be faxed to the insurance company ( they are the ones who are requesting the information) It is [employer] - secure fax# [phone]. After faxed to them please fax a copy to our department at [phone]. Send to scan center so that it remains electronically in her medical record. once we have evaulated her we will then send our treatment plan to them. any questions - [phone] i am here monday through friday 8-4:30 Thanks Carlette   Letter generated as requested and taken to Dr [name] for signature.  Will fax to [employer] @ [phone] along with most recent office note that talks about the need for pulmonary rehab.

## 2019-05-24 NOTE — Telephone Encounter (Signed)
Received Epic message from [place] - she is working remotely today and will verify receipt when she returns to the office tomorrow.

## 2019-05-24 NOTE — Telephone Encounter (Signed)
Letter was brought to me to have RB sign. Letter was signed and taken back to Boone Master, CMA.

## 2019-05-24 NOTE — Telephone Encounter (Signed)
Signed letter and office note faxed to [facility] [employer] Information now faxed to pulm rehab  Message sent to [facility] to ask that she verify receipt so we can close this note.

## 2019-05-26 NOTE — Telephone Encounter (Signed)
Called and spoke to Kathleen Cameron, she states she has received everything she needs. Nothing further needed at this time.

## 2019-05-31 NOTE — Telephone Encounter (Signed)
Advised pt of results. Pt understood and nothing further is needed.   

## 2019-06-02 ENCOUNTER — Telehealth (HOSPITAL_COMMUNITY): Payer: Self-pay

## 2019-06-02 NOTE — Telephone Encounter (Signed)
Pulmonary Rehab Note:  Successful telephone encounter to Kathleen Cameron to discuss recent referral to pulmonary rehab post hospitalization for COPD with acute exacerbation. Discuss program details and goals. Patient states she is interested. She is provided insurance coverage information of 250.00 deductible and 10% co-insurance once deductible is met. Patient verbalizes understanding.   Plan: Patient will be contacted next week for pulmonary rehab orientation scheduling.  Portia E. Payne RN, BSN [place]  Cardiac and Pulmonary Rehabilitation [phone] Dial Direct: [phone]  

## 2019-06-03 ENCOUNTER — Other Ambulatory Visit: Payer: Self-pay

## 2019-06-03 ENCOUNTER — Ambulatory Visit (INDEPENDENT_AMBULATORY_CARE_PROVIDER_SITE_OTHER): Payer: Commercial Managed Care - PPO | Admitting: Pulmonary Disease

## 2019-06-03 ENCOUNTER — Encounter: Payer: Self-pay | Admitting: Pulmonary Disease

## 2019-06-03 DIAGNOSIS — J9611 Chronic respiratory failure with hypoxia: Secondary | ICD-10-CM | POA: Diagnosis not present

## 2019-06-03 DIAGNOSIS — R9389 Abnormal findings on diagnostic imaging of other specified body structures: Secondary | ICD-10-CM | POA: Diagnosis not present

## 2019-06-03 DIAGNOSIS — J438 Other emphysema: Secondary | ICD-10-CM

## 2019-06-03 DIAGNOSIS — J441 Chronic obstructive pulmonary disease with (acute) exacerbation: Secondary | ICD-10-CM | POA: Diagnosis not present

## 2019-06-03 DIAGNOSIS — Z79899 Other long term (current) drug therapy: Secondary | ICD-10-CM

## 2019-06-03 MED ORDER — AZITHROMYCIN 250 MG PO TABS: ORAL_TABLET | ORAL | 0 refills | Status: DC

## 2019-06-03 MED ORDER — PREDNISONE 10 MG PO TABS: ORAL_TABLET | ORAL | 0 refills | Status: DC

## 2019-06-03 NOTE — Assessment & Plan Note (Signed)
Patient reports she completed an overnight oximetry that showed that she did not need oxygen at night Last walk in office does show the patient can utilize 1 L.maintain oxygen saturations above 88%  Plan: Continue to use 1 L of O2 with physical exertion to maintain oxygen saturations above 88%

## 2019-06-03 NOTE — Progress Notes (Signed)
Virtual Visit via Telephone Note  I connected with Kathleen Cameron on 06/03/19 at 10:30 AM EST by telephone and verified that I am speaking with the correct person using two identifiers.  Location: Patient: Home Provider: Office [facility] - [address] [place], [address], [place], [place] [address]   I discussed the limitations, risks, security and privacy concerns of performing an evaluation and management service by telephone and the availability of in person appointments. I also discussed with the patient that there may be a patient responsible charge related to this service. The patient expressed understanding and agreed to proceed.  Patient consented to consult via telephone: Yes People present and their role in pt care: Pt    History of Present Illness:  54 year old adult never smoker (passive smoke exposure) followed in our office for restrictive/obstructive lung disease and abnormal CT of chest  PMH: Hypertension, dyslipidemia Smoking History: Never Smoker. Passive Smoke exposure.  Maintenance: Symbicort 160, Spiriva HandiHaler Patient of Dr. [name]  Chief complaint: Cough, wheezing  54 year old female never smoker (passive smoke exposure) followed in our office for COPD.  Patient has been evaluated recently in January/2021 for an acute COPD exacerbation.  Patient was deemed stable in January/2021.  Patient was encouraged to remain on Symbicort 160 as well as Spiriva HandiHaler.  Recommending patient be maintained on oxygen 1 L with exertion at night.  Patient needs an overnight oximetry on room air to assess for nocturnal oxygen levels.  She had an elevated blood pressure at that time patient was off of her Norvasc as well as Lasix.  She was encouraged to resume as directed.   Patient completing televisit with our office today due to 1 week of increased coughing with light yellow thick mucus as well as increased wheezing.  She reports adherence to her inhalers.  She  continues to deny that she has smoking.  She denies any recent smoke exposure.  She denies smoking marijuana.  Chart review reveals normal alpha-1 levels in June/2020.  Patient does have a history of elevated eosinophil counts.  Pulmonary function testing on file reviewed showing very severe COPD.  Observations/Objective:  10/14/2018-alpha-1-147, PI*MM  04/13/2019-CTA chest-no acute findings, stable emphysematous changes, numerous calcified lymph nodes with mediastinum and perihilar regions, stable small pleural-based nodules within each lung, all of which is compatible with previous granulomatous infection, chronic silicosis or pneumoconiosis, sarcoidosis or less likely sequela of asbestos exposure  04/14/2019-echocardiogram-LV ejection fraction 60 to 65%, left ventricle is normal functioning there is no left ventricular hypertrophy, grade 1 diastolic dysfunction, global right ventricle is normal systolic function  09/27/18 - Resp Allergy - elevated cockroach, ige 45   03/10/2018 - CBC Diff - eos 5, eos absolute 0.3  10/14/2018-pulmonary function test-FVC 1.08 (45% predicted) postbronchodilator ratio 57, postbronchodilator FEV1 0.56 (29% predicted), no bronchodilator response  Social History   Tobacco Use  Smoking Status Never Smoker  Smokeless Tobacco Never Used   Immunization History  Administered Date(s) Administered  . Influenza,inj,Quad PF,6+ Mos 01/05/2019    Assessment and Plan:  COPD with acute exacerbation (HCC) 1 week of increased cough and wheezing  Plan: Z-Pak today Prednisone course today Emphasized need for the patient to complete in person follow-up on 06/09/2019 with Dr. [name] Patient would be ideal candidate for trial of Breztri inhaler   COPD (chronic obstructive pulmonary disease) [facility]) Patient with limited passive smoke exposure.  She does admit to growing up in a household with smokers.  She denies significant household smoke exposure.  Most of her smoke  exposure came from her teen years and early 44s.  She continues to state that she is a never smoker.  She does have an elevated THC level 10 years ago based off chart review.  She denies marijuana use today.  June/2020 pulmonary function testing shows mixed restrictive and obstructive lung disease.  Questionable fixed asthma.  No bronchodilator response.   Agree that there probably a degree of fixed asthma given her pulmonary function testing.  IgE in the past 45.  Only elevated allergy on respiratory allergy panel was cockroach.  She does have a history of elevated eosinophil counts.  Patient frequently exacerbates.  High risk for hospitalization and emergency room utilization.  Plan: Z-Pak today Prednisone taper today May need to investigate biologic therapies on the patient at 06/09/2019 office visit Patient would be ideal candidate for trial of Breztri inhaler    Chronic respiratory failure with hypoxia [facility]) Patient reports she completed an overnight oximetry that showed that she did not need oxygen at night Last walk in office does show the patient can utilize 1 L.maintain oxygen saturations above 88%  Plan: Continue to use 1 L of O2 with physical exertion to maintain oxygen saturations above 88%  Abnormal CT of the chest History of abnormal CTs in the past December/2020 CTA chest shows potential ILD  Plan: Review CT of chest with patient at next office visit with Dr. Lamonte Sakai May need to consider bronchoscopy/EBUS to further evaluate imaging findings  Medication management Plan: Patient likely would benefit from trial of Breztri at office visit next week Preferred to do this in person so she can demonstrate proper inhaler technique May need to consider Biologics in the future to see if this could help potentially with her exacerbations, would favor Dupixent due to fixed obstruction Can consider clinical pharmacy team referral for medication investigation, med reconciliation,  inhaler teaching   Follow Up Instructions:  Return in about 1 week (around 06/10/2019), or if symptoms worsen or fail to improve, for Follow up with Dr. Lamonte Sakai.   I discussed the assessment and treatment plan with the patient. The patient was provided an opportunity to ask questions and all were answered. The patient agreed with the plan and demonstrated an understanding of the instructions.   The patient was advised to call back or seek an in-person evaluation if the symptoms worsen or if the condition fails to improve as anticipated.  I provided 32 minutes of non-face-to-face time during this encounter.   Lauraine Rinne, NP

## 2019-06-03 NOTE — Patient Instructions (Addendum)
You were seen today by Coral Ceo, NP  for:   1. COPD with acute exacerbation (HCC)  - azithromycin (ZITHROMAX) 250 MG tablet; 500mg  (two tablets) today, then 250mg  (1 tablet) for the next 4 days  Dispense: 6 tablet; Refill: 0 - predniSONE (DELTASONE) 10 MG tablet; 4 tabs for 2 days, then 3 tabs for 2 days, 2 tabs for 2 days, then 1 tab for 2 days, then stop  Dispense: 20 tablet; Refill: 0  2. Other emphysema (HCC)  Continue Symbicort 160 >>> 2 puffs in the morning right when you wake up, rinse out your mouth after use, 12 hours later 2 puffs, rinse after use >>> Take this daily, no matter what >>> This is not a rescue inhaler   Spiriva Handihaler capsule >>> take 2 puffs using the inhaler and 1 capsule daily  >>> rinse mouth out after use  >>> take daily no matter what  >>> this is not a rescue inhaler   Note your daily symptoms > remember "red flags" for COPD:   >>>Increase in cough >>>increase in sputum production >>>increase in shortness of breath or activity  intolerance.   If you notice these symptoms, please call the office to be seen.   3. Chronic respiratory failure with hypoxia (HCC)  Continue to use 1 L of oxygen with physical exertion to maintain oxygen saturations above 88%  Continue oxygen therapy as prescribed  >>>maintain oxygen saturations greater than 88 percent  >>>if unable to maintain oxygen saturations please contact the office  >>>do not smoke with oxygen  >>>can use nasal saline gel or nasal saline rinses to moisturize nose if oxygen causes dryness  4. Abnormal CT of the chest  We will review your previous CT with you in office at next appointment  5. Medication management  At next office visit we may trial you on a new inhaler called    We recommend today:   Meds ordered this encounter  Medications  . azithromycin (ZITHROMAX) 250 MG tablet    Sig: 500mg  (two tablets) today, then 250mg  (1 tablet) for the next 4 days   Dispense:  6 tablet    Refill:  0  . predniSONE (DELTASONE) 10 MG tablet    Sig: 4 tabs for 2 days, then 3 tabs for 2 days, 2 tabs for 2 days, then 1 tab for 2 days, then stop    Dispense:  20 tablet    Refill:  0    Follow Up:    Return in about 1 week (around 06/10/2019), or if symptoms worsen or fail to improve, for Follow up with Dr. Markus Daft.   Please do your part to reduce the spread of COVID-19:      Reduce your risk of any infection  and COVID19 by using the similar precautions used for avoiding the common cold or flu:  Wash your hands often with soap and warm water for at least 20 seconds.  If soap and water are not readily available, use an alcohol-based hand sanitizer with at least 60% alcohol.  . If coughing or sneezing, cover your mouth and nose by coughing or sneezing into the elbow areas of your shirt or coat, into a tissue or into your sleeve (not your hands). A MASK when in public  . Avoid shaking hands with others and consider head nods or verbal greetings only. . Avoid touching your eyes, nose, or mouth with unwashed hands.  . Avoid close contact with  people who are sick. . Avoid places or events with large numbers of people in one location, like concerts or sporting events. . If you have some symptoms but not all symptoms, continue to monitor at home and seek medical attention if your symptoms worsen. . If you are having a medical emergency, call 911.   [place] / e-Visit: [email or web address]         [facility]: [facility]: [phone]                   [facility]: [phone]     It is flu season:   >>> Best ways to protect herself from the flu: Receive the yearly flu vaccine, practice good hand hygiene washing with soap and also using hand sanitizer when available, eat a nutritious meals,  get adequate rest, hydrate appropriately   Please contact the office if your symptoms worsen or you have concerns that you are not improving.   Thank you for choosing [facility] for your healthcare, and for allowing [place] to partner with you on your healthcare journey. I am thankful to be able to provide care to you today.   Wyn Quaker FNP-C

## 2019-06-03 NOTE — Assessment & Plan Note (Signed)
History of abnormal CTs in the past December/2020 CTA chest shows potential ILD  Plan: Review CT of chest with patient at next office visit with Dr. [name] May need to consider bronchoscopy/EBUS to further evaluate imaging findings

## 2019-06-03 NOTE — Assessment & Plan Note (Addendum)
Patient with limited passive smoke exposure.  She does admit to growing up in a household with smokers.  She denies significant household smoke exposure.  Most of her smoke exposure came from her teen years and early 31s.  She continues to state that she is a never smoker.  She does have an elevated THC level 10 years ago based off chart review.  She denies marijuana use today.  June/2020 pulmonary function testing shows mixed restrictive and obstructive lung disease.  Questionable fixed asthma.  No bronchodilator response.   Agree that there probably a degree of fixed asthma given her pulmonary function testing.  IgE in the past 43.  Only elevated allergy on respiratory allergy panel was cockroach.  She does have a history of elevated eosinophil counts.  Patient frequently exacerbates.  High risk for hospitalization and emergency room utilization.  Plan: Z-Pak today Prednisone taper today May need to investigate biologic therapies on the patient at 06/09/2019 office visit Patient would be ideal candidate for trial of Breztri inhaler

## 2019-06-03 NOTE — Assessment & Plan Note (Signed)
Plan: Patient likely would benefit from trial of Breztri at office visit next week Preferred to do this in person so she can demonstrate proper inhaler technique May need to consider Biologics in the future to see if this could help potentially with her exacerbations, would favor Dupixent due to fixed obstruction Can consider clinical pharmacy team referral for medication investigation, med reconciliation, inhaler teaching

## 2019-06-03 NOTE — Assessment & Plan Note (Signed)
1 week of increased cough and wheezing  Plan: Z-Pak today Prednisone course today Emphasized need for the patient to complete in person follow-up on 06/09/2019 with Dr. [name] Patient would be ideal candidate for trial of Breztri inhaler

## 2019-06-05 NOTE — Progress Notes (Signed)
Cardiology Office Note:    Date:  06/06/2019   ID:  Kathleen Cameron, DOB 06-23-1965, MRN [id number]  PCP:  Horald Pollen, MD  Cardiologist:  Donato Heinz, MD  Electrophysiologist:  None   Referring MD: Horald Pollen, *   No chief complaint on file.   History of Present Illness:    Kathleen Cameron is a 54 y.o. female with a hx of hypertension, severe obstructive pulmonary disease on home 2L O2 who presents for follow-up.  She was referred by [name], NP for evaluation of palpitations, with initial visit on 03/14/2019.  She was seen in the ED on 09/23/2018 with shortness of breath.  CTA was negative for PE but did show emphysema.  She was referred to pulmonology.  She reported that she has been experiencing palpitations and dyspnea with minimal exertion, so was referred to cardiology for evaluation.  She reports that she has palpitations where she feels like her heart is racing.  Primarily occurs when she exerts herself.  States that she has palpitations with minimal exertion.  Denies any exertional chest pain.  Does report that she gets short of breath with minimal exertion.    Underwent an echocardiogram on 12/20/2018, which showed normal systolic function (EF greater than 76%, grade 1 diastolic dysfunction, mild LVH, normal RV function, no significant valvular disease, PASP 42, IVC small/colllapsible.  Cardiac monitor on 04/19/2019 showed no significant arrhythmias.  She was admitted to [facility] from 12/23 through 04/17/2019 after presenting with chest pain and shortness of breath.  She was treated for COPD exacerbation.  Troponin level increased to 372.  Cardiac catheterization was attempted but she was unable to lie flat.  She completed 3 days on heparin drip.  Outpatient coronary CTA was ordered.  Since her discharge, she reports that she has been doing better.  States that she has not had any further chest pain.  Has had some episodes of tachycardia  with minimal exertion.  Reports that she took a nitroglycerin today because her heart was racing when walking to the car for her appointment.  Denies any chest pain.  Continues to have shortness of breath with minimal exertion, unchanged from prior.  Denies any lightheadedness or syncope.    Past Medical History:  Diagnosis Date  . Hypertension     No past surgical history on file.  Current Medications: Current Meds  Medication Sig  . acetaminophen (TYLENOL) 325 MG tablet Take 2 tablets (650 mg total) by mouth every 6 (six) hours as needed for mild pain (or Fever >/= 101).  [name] albuterol (VENTOLIN HFA) 108 (90 Base) MCG/ACT inhaler INHALE 2 PUFFS INTO THE LUNGS EVERY 6 HOURS AS NEEDED FOR WHEEZING OR SHORTNESS OF BREATH  . aspirin EC 81 MG EC tablet Take 1 tablet (81 mg total) by mouth daily.  [name] azithromycin (ZITHROMAX) 250 MG tablet 500mg  (two tablets) today, then 250mg  (1 tablet) for the next 4 days  . bisoprolol (ZEBETA) 10 MG tablet Take 1 tablet (10 mg total) by mouth daily.  . budesonide-formoterol (SYMBICORT) 160-4.5 MCG/ACT inhaler Inhale 2 puffs into the lungs 2 (two) times daily.  . famotidine (PEPCID) 20 MG tablet Take 1 tablet (20 mg total) by mouth 2 (two) times daily as needed for heartburn or indigestion.  [name] guaiFENesin (MUCINEX) 600 MG 12 hr tablet Take 600 mg by mouth 2 (two) times daily as needed for cough or to loosen phlegm.  [name] ipratropium-albuterol (DUONEB) 0.5-2.5 (3) MG/3ML SOLN Take 3 mLs  by nebulization every 6 (six) hours as needed.  . nitroGLYCERIN (NITROSTAT) 0.4 MG SL tablet Place 1 tablet (0.4 mg total) under the tongue every 5 (five) minutes as needed for chest pain.  . polyethylene glycol (MIRALAX / GLYCOLAX) 17 g packet Take 17 g by mouth daily.  . predniSONE (DELTASONE) 10 MG tablet 4 tabs for 2 days, then 3 tabs for 2 days, 2 tabs for 2 days, then 1 tab for 2 days, then stop  . rosuvastatin (CRESTOR) 20 MG tablet Take 1 tablet (20 mg total) by mouth daily.    [name] tiotropium (SPIRIVA) 18 MCG inhalation capsule Place 1 capsule (18 mcg total) into inhaler and inhale daily.     Allergies:   Stiolto respimat [tiotropium bromide-olodaterol]   Social History   Socioeconomic History  . Marital status: Single    Spouse name: Not on file  . Number of children: Not on file  . Years of education: Not on file  . Highest education level: Not on file  Occupational History  . Not on file  Tobacco Use  . Smoking status: Never Smoker  . Smokeless tobacco: Never Used  Substance and Sexual Activity  . Alcohol use: Yes    Alcohol/week: 2.0 standard drinks    Types: 2 Cans of beer per week  . Drug use: Never  . Sexual activity: Not on file  Other Topics Concern  . Not on file  Social History Narrative  . Not on file   Social Determinants of Health   Financial Resource Strain:   . Difficulty of Paying Living Expenses: Not on file  Food Insecurity:   . Worried About Programme researcher, broadcasting/film/video in the Last Year: Not on file  . Ran Out of Food in the Last Year: Not on file  Transportation Needs:   . Lack of Transportation (Medical): Not on file  . Lack of Transportation (Non-Medical): Not on file  Physical Activity:   . Days of Exercise per Week: Not on file  . Minutes of Exercise per Session: Not on file  Stress:   . Feeling of Stress : Not on file  Social Connections:   . Frequency of Communication with Friends and Family: Not on file  . Frequency of Social Gatherings with Friends and Family: Not on file  . Attends Religious Services: Not on file  . Active Member of Clubs or Organizations: Not on file  . Attends Banker Meetings: Not on file  . Marital Status: Not on file     Family History: The patient's Family history is unknown by patient.  ROS:   Please see the history of present illness.    All other systems reviewed and are negative.  EKGs/Labs/Other Studies Reviewed:    The following studies were reviewed today:   EKG:   EKG is  ordered today.  The ekg ordered today demonstrates normal sinus rhythm, rate 92, poor R wave progression, T wave flattening inlimb leads  TTE 12/20/18:  1. The left ventricle has hyperdynamic systolic function, with an ejection fraction of >65%. The cavity size was normal. There is mildly increased left ventricular wall thickness. Left ventricular diastolic Doppler parameters are consistent with  impaired relaxation. No evidence of left ventricular regional wall motion abnormalities.  2. The aortic root is normal in size and structure.  3. The aortic valve is tricuspid. No stenosis of the aortic valve.  4. Trivial pericardial effusion is present.  5. The right ventricle has normal systolic function.  The cavity was normal. There is no increase in right ventricular wall thickness.  6. Normal IVC size with PA systolic pressure 42 mmHg.  7. No evidence of mitral valve stenosis. No significant mitral regurgitation.  Cardiac monitor 04/19/19:  No significant arrhythmias detected 3 days of data recorded on Zio monitor. Patient had a min HR of 60 bpm, max HR of 129 bpm, and avg HR of 79 bpm. Predominant underlying rhythm was Sinus Rhythm. No VT, SVT, atrial fibrillation, high degree block, or pauses noted. Isolated atrial and ventricular ectopy was rare (<1%). There were 0 triggered events. No significant arrhythmias detected.  TTE 04/14/19: 1. Left ventricular ejection fraction, by visual estimation, is 60 to  65%. The left ventricle has normal function. There is no left ventricular  hypertrophy.  2. Left ventricular diastolic parameters are consistent with Grade I  diastolic dysfunction (impaired relaxation).  3. The left ventricle has no regional wall motion abnormalities.  4. Global right ventricle has normal systolic function.The right  ventricular size is normal. No increase in right ventricular wall  thickness.  5. Left atrial size was normal.  6. Right atrial size was  normal.  7. The mitral valve is normal in structure. No evidence of mitral valve  regurgitation. No evidence of mitral stenosis.  8. The tricuspid valve is normal in structure.  9. The aortic valve is normal in structure. Aortic valve regurgitation is  not visualized. No evidence of aortic valve sclerosis or stenosis.  10. The pulmonic valve was normal in structure. Pulmonic valve  regurgitation is not visualized.  11. TR signal is inadequate for assessing pulmonary artery systolic  pressure.  12. The inferior vena cava is normal in size with greater than 50%  respiratory variability, suggesting right atrial pressure of 3 mmHg.   Recent Labs: 12/20/2018: ALT 13 04/13/2019: B Natriuretic Peptide 27.1; Magnesium 1.9 04/17/2019: BUN 34; Creatinine, Ser 1.08; Hemoglobin 11.7; Platelets 262; Potassium 5.1; Sodium 139  Recent Lipid Panel    Component Value Date/Time   CHOL 193 04/13/2019 0640   CHOL 300 (H) 03/10/2018 1033   TRIG 85 04/13/2019 0640   HDL 68 04/13/2019 0640   HDL 72 03/10/2018 1033   CHOLHDL 2.8 04/13/2019 0640   VLDL 17 04/13/2019 0640   LDLCALC 108 (H) 04/13/2019 0640   LDLCALC 206 (H) 03/10/2018 1033    Physical Exam:    VS:  BP (!) 145/85   Pulse (!) 101   Ht 5\' 1"  (1.549 m)   Wt 114 lb 12.8 oz (52.1 kg)   SpO2 (!) 89%   BMI 21.69 kg/m     Wt Readings from Last 3 Encounters:  06/06/19 114 lb 12.8 oz (52.1 kg)  05/03/19 112 lb 9.6 oz (51.1 kg)  04/14/19 115 lb 4.8 oz (52.3 kg)     GEN:  in no acute distress, on O2 HEENT: Normal NECK: No JVD LYMPHATICS: No lymphadenopathy CARDIAC: RRR, no murmurs, rubs, gallops RESPIRATORY: Diminished breath sounds ABDOMEN: Soft, non-tender, non-distended MUSCULOSKELETAL:  No edema; No deformity  SKIN: Warm and dry NEUROLOGIC:  Alert and oriented x 3 PSYCHIATRIC:  Normal affect   ASSESSMENT:    1. Precordial pain   2. Pre-procedure lab exam    PLAN:    Chest pain: Recent admission with chest pain and  mild troponin elevation (high-sensitivity troponin 372), but occurred in setting of COPD exacerbation.  She was unable to lie flat for cardiac catheterization.  Coronary CT was ordered on discharge from hospital, scheduled  for 06/07/2018.  Will check BMET  Palpitations: No significant arrhythmia on Zio patch  Hypertension: On bisoprolol 10 mg daily.    Hyperlipidemia: On rosuvastatin 20 mg daily.  LDL 108 on 04/13/2019, significantly improved from 206 in 2019  RTC in 3 months   Medication Adjustments/Labs and Tests Ordered: Current medicines are reviewed at length with the patient today.  Concerns regarding medicines are outlined above.  Orders Placed This Encounter  Procedures  . Basic metabolic panel   No orders of the defined types were placed in this encounter.   Patient Instructions  Medication Instructions:  Your physician recommends that you continue on your current medications as directed. Please refer to the Current Medication list given to you today.  *If you need a refill on your cardiac medications before your next appointment, please call your pharmacy*  Lab Work: BMET for CT scan today  If you have labs (blood work) drawn today and your tests are completely normal, you will receive your results only by: [name] MyChart Message (if you have MyChart) OR . A paper copy in the mail If you have any lab test that is abnormal or we need to change your treatment, we will call you to review the results.  Testing/Procedures: CT scan as scheduled  Follow-Up: At [facility], you and your health needs are our priority.  As part of our continuing mission to provide you with exceptional heart care, we have created designated Provider Care Teams.  These Care Teams include your primary Cardiologist (physician) and Advanced Practice Providers (APPs -  Physician Assistants and Nurse Practitioners) who all work together to provide you with the care you need, when you need it.  Your next  appointment:   3 month(s)  The format for your next appointment:   In Person  Provider:   Epifanio Lesches, MD        Signed, [name], MD  06/06/2019 5:44 PM    [facility]

## 2019-06-06 ENCOUNTER — Encounter: Payer: Self-pay | Admitting: Cardiology

## 2019-06-06 ENCOUNTER — Ambulatory Visit (INDEPENDENT_AMBULATORY_CARE_PROVIDER_SITE_OTHER): Payer: Commercial Managed Care - PPO | Admitting: Cardiology

## 2019-06-06 ENCOUNTER — Other Ambulatory Visit: Payer: Self-pay

## 2019-06-06 ENCOUNTER — Other Ambulatory Visit (HOSPITAL_COMMUNITY): Payer: Self-pay | Admitting: Emergency Medicine

## 2019-06-06 VITALS — BP 145/85 | HR 101 | Ht 61.0 in | Wt 114.8 lb

## 2019-06-06 DIAGNOSIS — R072 Precordial pain: Secondary | ICD-10-CM | POA: Diagnosis not present

## 2019-06-06 DIAGNOSIS — Z01812 Encounter for preprocedural laboratory examination: Secondary | ICD-10-CM

## 2019-06-06 DIAGNOSIS — R002 Palpitations: Secondary | ICD-10-CM

## 2019-06-06 MED ORDER — IVABRADINE HCL 7.5 MG PO TABS: 7.5000 mg | ORAL_TABLET | Freq: Once | ORAL | 0 refills | Status: AC

## 2019-06-06 NOTE — Patient Instructions (Signed)
Medication Instructions:  Your physician recommends that you continue on your current medications as directed. Please refer to the Current Medication list given to you today.  *If you need a refill on your cardiac medications before your next appointment, please call your pharmacy*  Lab Work: BMET for CT scan today  If you have labs (blood work) drawn today and your tests are completely normal, you will receive your results only by: [name] MyChart Message (if you have MyChart) OR . A paper copy in the mail If you have any lab test that is abnormal or we need to change your treatment, we will call you to review the results.  Testing/Procedures: CT scan as scheduled  Follow-Up: At [facility], you and your health needs are our priority.  As part of our continuing mission to provide you with exceptional heart care, we have created designated Provider Care Teams.  These Care Teams include your primary Cardiologist (physician) and Advanced Practice Providers (APPs -  Physician Assistants and Nurse Practitioners) who all work together to provide you with the care you need, when you need it.  Your next appointment:   3 month(s)  The format for your next appointment:   In Person  Provider:   Epifanio Lesches, MD

## 2019-06-07 ENCOUNTER — Telehealth (HOSPITAL_COMMUNITY): Payer: Self-pay

## 2019-06-07 ENCOUNTER — Telehealth (HOSPITAL_COMMUNITY): Payer: Self-pay | Admitting: Emergency Medicine

## 2019-06-07 ENCOUNTER — Other Ambulatory Visit (HOSPITAL_COMMUNITY): Payer: Self-pay | Admitting: Cardiology

## 2019-06-07 DIAGNOSIS — R002 Palpitations: Secondary | ICD-10-CM

## 2019-06-07 LAB — BASIC METABOLIC PANEL
BUN/Creatinine Ratio: 20 (ref 9–23)
BUN: 21 mg/dL (ref 6–24)
CO2: 35 mmol/L — ABNORMAL HIGH (ref 20–29)
Calcium: 10.3 mg/dL — ABNORMAL HIGH (ref 8.7–10.2)
Chloride: 91 mmol/L — ABNORMAL LOW (ref 96–106)
Creatinine, Ser: 1.05 mg/dL — ABNORMAL HIGH (ref 0.57–1.00)
GFR calc Af Amer: 69 mL/min/{1.73_m2} (ref >59)
GFR calc non Af Amer: 60 mL/min/{1.73_m2} (ref >59)
Glucose: 95 mg/dL (ref 65–99)
Potassium: 3.4 mmol/L — ABNORMAL LOW (ref 3.5–5.2)
Sodium: 145 mmol/L — ABNORMAL HIGH (ref 134–144)

## 2019-06-07 MED ORDER — IVABRADINE HCL 7.5 MG PO TABS: 7.5000 mg | ORAL_TABLET | Freq: Once | ORAL | 0 refills | Status: AC

## 2019-06-07 NOTE — Telephone Encounter (Signed)
Reaching out to patient to offer assistance regarding upcoming cardiac imaging study; pt verbalizes understanding of appt date/time, parking situation and where to check in, pre-test NPO status and medications ordered, and verified current allergies; name and call back number provided for further questions should they arise Rockwell Alexandria RN Navigator Cardiac Imaging [facility] [phone] office [phone] cell  Adding 7.5mg  ivabradine (corlanor) to be taken 2 hr prior to appt per conversation with Audree Camel yesterday. Confirmed pharmacy location with patient Pt verbalized understanding.

## 2019-06-08 ENCOUNTER — Ambulatory Visit (HOSPITAL_COMMUNITY)
Admission: RE | Admit: 2019-06-08 | Discharge: 2019-06-08 | Disposition: A | Discharge status: Home / Self Care | Payer: Commercial Managed Care - PPO | Source: Ambulatory Visit | Attending: Physician Assistant | Admitting: Physician Assistant

## 2019-06-08 ENCOUNTER — Other Ambulatory Visit: Payer: Self-pay

## 2019-06-08 ENCOUNTER — Ambulatory Visit: Payer: Commercial Managed Care - PPO | Admitting: Emergency Medicine

## 2019-06-08 DIAGNOSIS — R072 Precordial pain: Secondary | ICD-10-CM | POA: Diagnosis not present

## 2019-06-08 MED ORDER — NITROGLYCERIN 0.4 MG SL SUBL: 0.8000 mg | SUBLINGUAL_TABLET | Freq: Once | SUBLINGUAL | Status: DC

## 2019-06-08 MED ORDER — NITROGLYCERIN 0.4 MG SL SUBL
SUBLINGUAL_TABLET | SUBLINGUAL | Status: AC
  Filled 2019-06-08: qty 2

## 2019-06-08 MED ORDER — IOHEXOL 350 MG/ML SOLN
100.0000 mL | Freq: Once | INTRAVENOUS | Status: AC | PRN
  Administered 2019-06-08: 100 mL via INTRAVENOUS

## 2019-06-09 ENCOUNTER — Ambulatory Visit (INDEPENDENT_AMBULATORY_CARE_PROVIDER_SITE_OTHER): Payer: Commercial Managed Care - PPO | Admitting: Emergency Medicine

## 2019-06-09 ENCOUNTER — Encounter: Payer: Self-pay | Admitting: Emergency Medicine

## 2019-06-09 DIAGNOSIS — J438 Other emphysema: Secondary | ICD-10-CM | POA: Diagnosis not present

## 2019-06-09 NOTE — Progress Notes (Signed)
Virtual Visit via Telephone Note  I connected with Kathleen Cameron on 06/09/19 at  2:30 PM EST by telephone and verified that I am speaking with the correct person using two identifiers.  Location: Patient: Home Provider: Home Office   I discussed the limitations, risks, security and privacy concerns of performing an evaluation and management service by telephone and the availability of in person appointments. I also discussed with the patient that there may be a patient responsible charge related to this service. The patient expressed understanding and agreed to proceed.   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  CT chest that shows calcified mediastinal hilar lymphadenopathy, calcified peripheral nodularity.  ACE level normal.  She has associated hypoxemic respiratory failure using oxygen 2 L/min during the day, 3 L/min at night.  Has been treated for multiple suspected exacerbations of COPD since I last saw her, hospitalized in December 2020, question also with a component of hypertension and diastolic CHF.   Observations/Objective: Repeat CT scan of her chest done on 04/13/2019 reviewed by me, shows no evidence of pulmonary embolism, no consolidation or pneumonia, scattered pleural-based nodules that are stable, several calcified, numerous calcified mediastinal and perihilar lymph nodes.  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   She was last seen in our office by B Cherre Huger 06/03/2019 treated with azithromycin and prednisone for another presumed exacerbation. Sx included cough and wheeze. She is feeling improved now.  She has some persistent exertional SOB Her current bronchodilator regimen >> not taking the symbicort or spiriva right now Uses DuoNeb a few times a week.   Assessment and Plan: COPD on no maintenance regimen She had discussed possibly starting Breztri w B Mack.  She is  still interested in doing this.  I will arrange for samples at the office for her to pick up next week.  We will call her.  I will plan to see her in 1 month to see how she responds. Continue albuterol or DuoNeb as needed.  Mediastinal calcified lymphadenopathy, calcified peripheral nodular disease.  Etiology unclear, consider prior exposures such as silica.  Consider sarcoidosis although ACE level negative.  Stable on serial imaging.  We will decide the timing of any follow-up CT chest when we see each other in office.  Follow Up Instructions: 1 month.    I discussed the assessment and treatment plan with the patient. The patient was provided an opportunity to ask questions and all were answered. The patient agreed with the plan and demonstrated an understanding of the instructions.   The patient was advised to call back or seek an in-person evaluation if the symptoms worsen or if the condition fails to improve as anticipated.  I provided 15 minutes of non-face-to-face time during this encounter.   Leslye Peer, MD

## 2019-06-13 ENCOUNTER — Telehealth: Payer: Self-pay | Admitting: Emergency Medicine

## 2019-06-13 MED ORDER — BREZTRI AEROSPHERE 160-9-4.8 MCG/ACT IN AERO: 2.0000 | INHALATION_SPRAY | Freq: Two times a day (BID) | RESPIRATORY_TRACT | 0 refills | Status: DC

## 2019-06-13 NOTE — Telephone Encounter (Signed)
Kathleen Peer, MD  Lorel Monaco, CMA  Need to get Breztri samples fer her to pick up next week, then OV in 1 month to see how she benefits on the new med Thanks -------------------------------------------------- Spoke with pt. She is aware that samples will be left at the front for her to pickup. ROV has been scheduled per RB. Nothing further was needed.

## 2019-06-14 ENCOUNTER — Ambulatory Visit (HOSPITAL_COMMUNITY): Payer: Commercial Managed Care - PPO

## 2019-06-14 ENCOUNTER — Telehealth (HOSPITAL_COMMUNITY): Payer: Self-pay | Admitting: *Deleted

## 2019-06-14 NOTE — Telephone Encounter (Signed)
Called to remind patient of orientation/walk test in pulmonary rehab @ 1415 06/14/2019.  Patient does not want to participate due to not being able to afford the co-pay.  Her insurance company will pay 90% and she is left with a 10% co-insurance.  She was informed to ask her doctor for another referral if she would like to come to the program in the future.

## 2019-07-06 ENCOUNTER — Telehealth: Payer: Self-pay | Admitting: Emergency Medicine

## 2019-07-06 NOTE — Telephone Encounter (Signed)
Spoke with pt, she is going to get the covid vaccine and wanted to know if she should get it. I advised her that all of our doctors recommend their patients get the covid vaccine if they never had reactions to other vaccines. Pt understood and nothing further is needed.

## 2019-07-07 ENCOUNTER — Telehealth: Payer: Self-pay | Admitting: Emergency Medicine

## 2019-07-07 DIAGNOSIS — J441 Chronic obstructive pulmonary disease with (acute) exacerbation: Secondary | ICD-10-CM

## 2019-07-07 DIAGNOSIS — I214 Non-ST elevation (NSTEMI) myocardial infarction: Secondary | ICD-10-CM

## 2019-07-07 DIAGNOSIS — J9611 Chronic respiratory failure with hypoxia: Secondary | ICD-10-CM

## 2019-07-07 NOTE — Telephone Encounter (Signed)
Please let the patient know that I reviewed her overnight oximetry testing from 05/11/19. Based on the testing over 3.5 hours, she does not need to wear her oxygen at night.

## 2019-07-08 ENCOUNTER — Telehealth: Payer: Self-pay | Admitting: Cardiology

## 2019-07-08 NOTE — Telephone Encounter (Signed)
Sarah with [employer] was calling. Their company needs documentation that the patient is on oxygen so that a portable machine can be shipped to the patient's residence.  Please fax documentation to [phone]  If further information is needed, please contact Sarah at [phone]

## 2019-07-08 NOTE — Telephone Encounter (Signed)
Spoke to Kathleen Cameron, advised we do not manage her home O2-advised to reach out to pulmonology.

## 2019-07-11 ENCOUNTER — Encounter: Payer: Self-pay | Admitting: Emergency Medicine

## 2019-07-11 ENCOUNTER — Ambulatory Visit (INDEPENDENT_AMBULATORY_CARE_PROVIDER_SITE_OTHER): Payer: Commercial Managed Care - PPO | Admitting: Emergency Medicine

## 2019-07-11 ENCOUNTER — Other Ambulatory Visit: Payer: Self-pay

## 2019-07-11 DIAGNOSIS — J438 Other emphysema: Secondary | ICD-10-CM

## 2019-07-11 DIAGNOSIS — R590 Localized enlarged lymph nodes: Secondary | ICD-10-CM | POA: Diagnosis not present

## 2019-07-11 DIAGNOSIS — R918 Other nonspecific abnormal finding of lung field: Secondary | ICD-10-CM

## 2019-07-11 MED ORDER — BREZTRI AEROSPHERE 160-9-4.8 MCG/ACT IN AERO: 2.0000 | INHALATION_SPRAY | Freq: Every day | RESPIRATORY_TRACT | 5 refills | Status: DC

## 2019-07-11 NOTE — Telephone Encounter (Signed)
Spoke with pt, aware of results/recs.   Attempted to call [employer] but received after hours service (after 5:00). Since pt does not need nocturnal O2 per RB's documentation below, will need to see what exactly is needed from our office.  Wcb.

## 2019-07-11 NOTE — Telephone Encounter (Signed)
Kathleen Cameron from [facility] needs order for oxygen for patient. Needs faxed to [phone]. Phone number is [phone].

## 2019-07-11 NOTE — Progress Notes (Signed)
   Subjective:    Patient ID: Kathleen Cameron, female    DOB: 12/07/1965, 54 y.o.   MRN: [id number]  HPI 53-year-old woman, never smoker with a history of hypertension, hyperlipidemia.  She was seen in our office 09/27/2018 after an ER visit for dyspnea.  This prompted a CT scan of her chest on 6/4 that I reviewed, shows no PE, emphysematous change and calcified mediastinal and hilar lymphadenopathy, some subpleural pulmonary nodules some partially calcified.  She states that she had a significant dust exposure, chemical exposure and secondhand smoke exposure with her husband.  No known mold exposure. 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 She has had wheeze before. The SOB dates back for less than a year.   6/8 labs: ACE 1, ESR 29, respiratory allergy panel mildly positive for cockroach, IgE 45, eosinophils 5.7%  Pulmonary function testing from today shows very severe obstruction with an FEV1 of 0.56 L (29% predicted), no bronchodilator response.  She was unable to do lung volumes or DLCO testing.  Her flow volume loop is consistent with severe obstruction.  ROV 11/15/2018 --follow-up visit today for obstructive lung disease, emphysematous change on CT, calcified mediastinal and hilar lymphadenopathy with some subpleural nodularity (partially calcified).  Normal ACE level as above.  At her last visit we tried initiating Symbicort to see if she would get benefit.  Labs to evaluate for emphysema, HIV negative, alpha-1 antitrypsin MM, normal level.  ROV 07/11/19 --follow-up visit for 53-year-old woman, never smoker, with emphysematous COPD, calcified mediastinal hilar lymphadenopathy subpleural calcified nodularity.  She has tried a few bronchodilators without success but recently tried Breztri. She reports that she believes that she has benefited, helps her breathing some. She still has exertional SOB. Some occasional wheeze. Uses duoneb prn, She is using 2L/min with exertion. ONO since last time did  not show that she needs to wear at night. Needs repeat Ct chest 12/21.    Review of Systems As per HPI.      Objective:   Physical Exam Vitals:   07/11/19 1604  BP: 124/70  Pulse: 79  Temp: (!) 97 F (36.1 C)  TempSrc: Temporal  SpO2: 97%  Weight: 115 lb (52.2 kg)  Height: 5' (1.524 m)   Gen: Pleasant, thin woman, in no distress,  normal affect  ENT: No lesions,  mouth clear,  oropharynx clear, no postnasal drip  Neck: No JVD, no stridor  Lungs: No use of accessory muscles, very distant, no crackles or wheezing on normal respiration, no wheeze on forced expiration  Cardiovascular: RRR, heart sounds normal, no murmur or gallops, no peripheral edema  Musculoskeletal: No deformities, no cyanosis or clubbing  Neuro: alert, awake, non focal  Skin: Warm, no lesions or rash      Assessment & Plan:  COPD (chronic obstructive pulmonary disease) (HCC) Please continue Breztri 2 puffs twice a day. Keep DuoNeb available to use up to every 6 hours if needed for shortness of breath, chest tightness, wheezing. You need to wear your oxygen at 2 L/min with exertion.  You do not need to use it at rest or while sleeping. Follow with Dr Byrum in 3 months or sooner if you have any problems.  Pulmonary nodules We will plan to repeat your CT scan of the chest in December 2021.  Robert Byrum, MD, PhD 01/19/2020, 1:07 PM [facility] [phone] or if no answer [phone]  

## 2019-07-11 NOTE — Patient Instructions (Signed)
Please continue Breztri 2 puffs twice a day. Keep DuoNeb available to use up to every 6 hours if needed for shortness of breath, chest tightness, wheezing. You need to wear your oxygen at 2 L/min with exertion.  You do not need to use it at rest or while sleeping. We will plan to repeat your CT scan of the chest in December 2021. Follow with Dr [name] in 3 months or sooner if you have any problems.

## 2019-07-12 ENCOUNTER — Encounter: Payer: Self-pay | Admitting: Emergency Medicine

## 2019-07-12 NOTE — Telephone Encounter (Signed)
Kathleen Cameron from [facility] checking status of the oxygen prescription. [employer] phone number is [phone]. Fax number is [phone].

## 2019-07-12 NOTE — Telephone Encounter (Signed)
Spoke with [employer]. She stated that they just need an order as well as the last OV notes that state she is using O2. They are aware that she does not need nocturnal O2. Patient had called them for a POC order since the local DME companies are not able to provide this for her.   RB, please advise if you are ok with [place] placing an order for a POC for exertion through [employer]. Thanks!

## 2019-07-12 NOTE — Telephone Encounter (Signed)
Kathleen Cameron, [employer] called back, looking for chartnotes about o2 need  Please call [phone]

## 2019-07-14 ENCOUNTER — Telehealth: Payer: Self-pay | Admitting: Emergency Medicine

## 2019-07-14 NOTE — Telephone Encounter (Signed)
This message was sent to Dr. [name] this week and we are awaiting a response. We will work off of that message and not duplicate.

## 2019-07-15 ENCOUNTER — Telehealth: Payer: Self-pay | Admitting: Emergency Medicine

## 2019-07-15 DIAGNOSIS — J9611 Chronic respiratory failure with hypoxia: Secondary | ICD-10-CM

## 2019-07-15 NOTE — Telephone Encounter (Signed)
Order for POC placed.  Nothing further needed at this time- will close encounter.

## 2019-07-15 NOTE — Telephone Encounter (Signed)
It is Ok with me to order, but I do not know if the patient has been titrated on pulsed O2.

## 2019-07-15 NOTE — Telephone Encounter (Signed)
Gretta from [employer] calling back. Tristan Schroeder phone number is [phone]. Fax number is [phone].

## 2019-07-15 NOTE — Telephone Encounter (Signed)
Spoke with Tristan Schroeder and notified that we will fax the notes and order once we get the OK from Dr [name]   Dr [name]- please advise if ok to send order to [employer] for POC, thanks

## 2019-07-15 NOTE — Telephone Encounter (Signed)
Spoke with pt, Dr. Pamelia Hoit is not a doctor, Pamelia Hoit is a rep from [facility] who is requesting pt's O2 order to be faxed to them at [phone].  Pt is purchasing an [employer] machine through their office, and they need the O2 order and last office notes to be faxed to their office.  We have already ordered this O2 for pt through another company.  New order placed, and PCCs will ensure rx to be faxed per office protocol.  Nothing further needed at this time- will close encounter.

## 2019-07-23 ENCOUNTER — Emergency Department (HOSPITAL_COMMUNITY): Payer: Commercial Managed Care - PPO

## 2019-07-23 ENCOUNTER — Other Ambulatory Visit: Payer: Self-pay

## 2019-07-23 ENCOUNTER — Observation Stay (HOSPITAL_COMMUNITY)
Admission: EM | Admit: 2019-07-23 | Discharge: 2019-07-24 | Disposition: A | Discharge status: Home / Self Care | Payer: Commercial Managed Care - PPO | Attending: Internal Medicine | Admitting: Internal Medicine

## 2019-07-23 ENCOUNTER — Encounter (HOSPITAL_COMMUNITY): Payer: Self-pay | Admitting: *Deleted

## 2019-07-23 DIAGNOSIS — M6281 Muscle weakness (generalized): Secondary | ICD-10-CM | POA: Diagnosis not present

## 2019-07-23 DIAGNOSIS — Z9981 Dependence on supplemental oxygen: Secondary | ICD-10-CM | POA: Insufficient documentation

## 2019-07-23 DIAGNOSIS — E785 Hyperlipidemia, unspecified: Secondary | ICD-10-CM | POA: Diagnosis not present

## 2019-07-23 DIAGNOSIS — J441 Chronic obstructive pulmonary disease with (acute) exacerbation: Secondary | ICD-10-CM | POA: Insufficient documentation

## 2019-07-23 DIAGNOSIS — Z79899 Other long term (current) drug therapy: Secondary | ICD-10-CM | POA: Diagnosis not present

## 2019-07-23 DIAGNOSIS — N182 Chronic kidney disease, stage 2 (mild): Secondary | ICD-10-CM | POA: Insufficient documentation

## 2019-07-23 DIAGNOSIS — I129 Hypertensive chronic kidney disease with stage 1 through stage 4 chronic kidney disease, or unspecified chronic kidney disease: Secondary | ICD-10-CM | POA: Diagnosis not present

## 2019-07-23 DIAGNOSIS — I2510 Atherosclerotic heart disease of native coronary artery without angina pectoris: Secondary | ICD-10-CM | POA: Insufficient documentation

## 2019-07-23 DIAGNOSIS — Z888 Allergy status to other drugs, medicaments and biological substances status: Secondary | ICD-10-CM | POA: Diagnosis not present

## 2019-07-23 DIAGNOSIS — J9611 Chronic respiratory failure with hypoxia: Principal | ICD-10-CM | POA: Insufficient documentation

## 2019-07-23 DIAGNOSIS — Z7982 Long term (current) use of aspirin: Secondary | ICD-10-CM | POA: Insufficient documentation

## 2019-07-23 DIAGNOSIS — Z20822 Contact with and (suspected) exposure to covid-19: Secondary | ICD-10-CM | POA: Insufficient documentation

## 2019-07-23 DIAGNOSIS — I10 Essential (primary) hypertension: Secondary | ICD-10-CM | POA: Diagnosis present

## 2019-07-23 HISTORY — DX: Unspecified asthma, uncomplicated: J45.909

## 2019-07-23 MED ORDER — ALBUTEROL SULFATE HFA 108 (90 BASE) MCG/ACT IN AERS
4.0000 | INHALATION_SPRAY | Freq: Once | RESPIRATORY_TRACT | Status: AC
  Administered 2019-07-24: 4 via RESPIRATORY_TRACT
  Filled 2019-07-23: qty 6.7

## 2019-07-23 NOTE — ED Triage Notes (Signed)
The pt c/o sob  For several hours no pain anywhere  She has home 02  Hx of asthma and copd no smoking history

## 2019-07-23 NOTE — ED Provider Notes (Signed)
[facility] EMERGENCY DEPARTMENT Provider Note   CSN: [id number] Arrival date & time: 07/23/19  2331     History Chief Complaint  Patient presents with  . Shortness of Breath    Kathleen Cameron is a 54 y.o. female.  Patient is a 54 year old female with past medical history of COPD, chronic renal insufficiency, coronary artery disease, hypertension. She presents today for evaluation of shortness of breath. This began several days ago, but became worse this evening. Patient was found to be hypoxic by EMS with oxygen saturations in the low 80s while on her 2 L home O2. Patient was placed on CPAP and transported here. She was initially hypertensive, but blood pressure is improving. She denies chest pain. She denies fevers or chills. She denies a history of Covid or contact with known Covid patients. She received Solu-Medrol by EMS.  The history is provided by the patient.  Shortness of Breath Severity:  Moderate Onset quality:  Sudden Duration:  3 days Timing:  Constant Progression:  Worsening Chronicity:  Recurrent Relieved by:  Nothing Worsened by:  Nothing Ineffective treatments:  None tried Associated symptoms: no fever        Past Medical History:  Diagnosis Date  . Hypertension     Patient Active Problem List   Diagnosis Date Noted  . NSTEMI (non-ST elevated myocardial infarction) (HCC)   . COPD exacerbation (HCC)   . COPD with acute exacerbation (HCC) 06/03/2019  . Medication management 06/03/2019  . Hypokalemia 04/13/2019  . Chest pain 04/13/2019  . Elevated troponin 04/13/2019  . Palpitations 03/07/2019  . Chronic respiratory failure with hypoxia (HCC) 03/07/2019  . CKD (chronic kidney disease) 01/05/2019  . Acute on chronic respiratory failure with hypoxia (HCC) 12/21/2018  . Abnormal CT of the chest 10/14/2018  . COPD (chronic obstructive pulmonary disease) (HCC) 09/29/2018  . Pulmonary nodules 09/27/2018  . Dyslipidemia 07/07/2018   . Essential hypertension 03/10/2018    History reviewed. No pertinent surgical history.   OB History   No obstetric history on file.     Family History  Family history unknown: Yes    Social History   Tobacco Use  . Smoking status: Never Smoker  . Smokeless tobacco: Never Used  Substance Use Topics  . Alcohol use: Yes    Alcohol/week: 2.0 standard drinks    Types: 2 Cans of beer per week  . Drug use: Never    Home Medications Prior to Admission medications   Medication Sig Start Date End Date Taking? Authorizing Provider  acetaminophen (TYLENOL) 325 MG tablet Take 2 tablets (650 mg total) by mouth every 6 (six) hours as needed for mild pain (or Fever >/= 101). 12/22/18   Rizwan, Ladell Heads, MD  albuterol (VENTOLIN HFA) 108 (90 Base) MCG/ACT inhaler INHALE 2 PUFFS INTO THE LUNGS EVERY 6 HOURS AS NEEDED FOR WHEEZING OR SHORTNESS OF BREATH 04/26/19   Leslye Peer, MD  aspirin EC 81 MG EC tablet Take 1 tablet (81 mg total) by mouth daily. 04/17/19   [name], MD  bisoprolol (ZEBETA) 10 MG tablet Take 1 tablet (10 mg total) by mouth daily. 04/17/19   [name], MD  Budeson-Glycopyrrol-Formoterol (BREZTRI AEROSPHERE) 160-9-4.8 MCG/ACT AERO Inhale 2 puffs into the lungs in the morning and at bedtime. 06/13/19   Leslye Peer, MD  Budeson-Glycopyrrol-Formoterol (BREZTRI AEROSPHERE) 160-9-4.8 MCG/ACT AERO Inhale 2 puffs into the lungs daily. 07/11/19   Leslye Peer, MD  famotidine (PEPCID) 20 MG tablet Take 1 tablet (  20 mg total) by mouth 2 (two) times daily as needed for heartburn or indigestion. 04/17/19 04/16/20  [name], MD  guaiFENesin (MUCINEX) 600 MG 12 hr tablet Take 600 mg by mouth 2 (two) times daily as needed for cough or to loosen phlegm.    [provider]  ipratropium-albuterol (DUONEB) 0.5-2.5 (3) MG/3ML SOLN Take 3 mLs by nebulization every 6 (six) hours as needed. 05/03/19   [name], NP  nitroGLYCERIN (NITROSTAT) 0.4 MG SL tablet Place 1  tablet (0.4 mg total) under the tongue every 5 (five) minutes as needed for chest pain. 04/17/19   [name], MD  polyethylene glycol (MIRALAX / GLYCOLAX) 17 g packet Take 17 g by mouth daily. 04/17/19   [name], MD  rosuvastatin (CRESTOR) 20 MG tablet Take 1 tablet (20 mg total) by mouth daily. 03/14/19 07/11/19  [name], MD    Allergies    Stiolto respimat [tiotropium bromide-olodaterol]  Review of Systems   Review of Systems  Constitutional: Negative for fever.  Respiratory: Positive for shortness of breath.   All other systems reviewed and are negative.   Physical Exam Updated Vital Signs Ht 5\' 1"  (1.549 m)   Wt 52.2 kg   BMI 21.73 kg/m   Physical Exam Vitals and nursing note reviewed.  Constitutional:      General: She is not in acute distress.    Appearance: She is well-developed. She is not diaphoretic.  HENT:     Head: Normocephalic and atraumatic.  Cardiovascular:     Rate and Rhythm: Normal rate and regular rhythm.     Heart sounds: No murmur. No friction rub. No gallop.   Pulmonary:     Effort: Respiratory distress present.     Breath sounds: Normal breath sounds. No wheezing.     Comments: Patient with rhonchi and decreased air movement. She is in moderate respiratory distress with tachypnea and accessory muscle use. Abdominal:     General: Bowel sounds are normal. There is no distension.     Palpations: Abdomen is soft.     Tenderness: There is no abdominal tenderness.  Musculoskeletal:        General: Normal range of motion.     Cervical back: Normal range of motion and neck supple.     Right lower leg: No tenderness. No edema.     Left lower leg: No tenderness. No edema.  Skin:    General: Skin is warm and dry.  Neurological:     Mental Status: She is alert and oriented to person, place, and time.     ED Results / Procedures / Treatments   Labs (all labs ordered are listed, but only abnormal results are displayed) Labs  Reviewed  RESPIRATORY PANEL BY RT PCR (FLU A&B, COVID)  BRAIN NATRIURETIC PEPTIDE  COMPREHENSIVE METABOLIC PANEL  CBC WITH DIFFERENTIAL/PLATELET  TROPONIN I (HIGH SENSITIVITY)    EKG EKG Interpretation  Date/Time:  Saturday July 23 2019 23:38:50 EDT Ventricular Rate:  104 PR Interval:    QRS Duration: 78 QT Interval:  357 QTC Calculation: 470 R Axis:   -39 Text Interpretation: Sinus tachycardia Right atrial enlargement Left axis deviation Confirmed by 12-28-1998 (Geoffery Lyons) on 07/23/2019 11:49:50 PM   Radiology No results found.  Procedures Procedures (including critical care time)  Medications Ordered in ED Medications  albuterol (VENTOLIN HFA) 108 (90 Base) MCG/ACT inhaler 4 puff (has no administration in time range)    ED Course  I have reviewed the triage  vital signs and the nursing notes.  Pertinent labs & imaging results that were available during my care of the patient were reviewed by me and considered in my medical decision making (see chart for details).    MDM Rules/Calculators/A&P  Patient with history of COPD on home oxygen presenting with complaints of wheezing and difficulty breathing.  Patient arrived here in respiratory distress, tachypneic, and not moving air efficiently.  Patient given albuterol in the ED, as well as Solu-Medrol by EMS.  She has been weaned off of the nonrebreather and is now breathing and resting more comfortably.  She is back to the 2 L of oxygen she normally requires, however still appears somewhat uncomfortable..  Patient's work-up reveals only findings of COPD on her x-ray.    I do feel as though admission for additional breathing treatments and steroids is indicated.  I have spoken with Dr. Alcario Drought who agrees to admit.  CRITICAL CARE Performed by: Veryl Speak Total critical care time: 35 minutes Critical care time was exclusive of separately billable procedures and treating other patients. Critical care was necessary to  treat or prevent imminent or life-threatening deterioration. Critical care was time spent personally by me on the following activities: development of treatment plan with patient and/or surrogate as well as nursing, discussions with consultants, evaluation of patient's response to treatment, examination of patient, obtaining history from patient or surrogate, ordering and performing treatments and interventions, ordering and review of laboratory studies, ordering and review of radiographic studies, pulse oximetry and re-evaluation of patient's condition.   Final Clinical Impression(s) / ED Diagnoses Final diagnoses:  None    Rx / DC Orders ED Discharge Orders    None       Veryl Speak, MD 07/24/19 [phone]

## 2019-07-23 NOTE — ED Notes (Signed)
Pt had iv placed by [facility] solumedrol 125mg  given by [facility] on the way here  She arrived on c-pa- and switched to a non-rebreather

## 2019-07-24 ENCOUNTER — Encounter (HOSPITAL_COMMUNITY): Payer: Self-pay | Admitting: Internal Medicine

## 2019-07-24 DIAGNOSIS — J441 Chronic obstructive pulmonary disease with (acute) exacerbation: Secondary | ICD-10-CM

## 2019-07-24 DIAGNOSIS — I10 Essential (primary) hypertension: Secondary | ICD-10-CM

## 2019-07-24 DIAGNOSIS — J9611 Chronic respiratory failure with hypoxia: Secondary | ICD-10-CM | POA: Diagnosis not present

## 2019-07-24 DIAGNOSIS — E785 Hyperlipidemia, unspecified: Secondary | ICD-10-CM

## 2019-07-24 LAB — CBC WITH DIFFERENTIAL/PLATELET
Abs Immature Granulocytes: 0.02 10*3/uL (ref 0.00–0.07)
Basophils Absolute: 0.1 10*3/uL (ref 0.0–0.1)
Basophils Relative: 1 %
Eosinophils Absolute: 0.3 10*3/uL (ref 0.0–0.5)
Eosinophils Relative: 3 %
HCT: 41.5 % (ref 36.0–46.0)
Hemoglobin: 12.9 g/dL (ref 12.0–15.0)
Immature Granulocytes: 0 %
Lymphocytes Relative: 29 %
Lymphs Abs: 2.7 10*3/uL (ref 0.7–4.0)
MCH: 28.5 pg (ref 26.0–34.0)
MCHC: 31.1 g/dL (ref 30.0–36.0)
MCV: 91.8 fL (ref 80.0–100.0)
Monocytes Absolute: 0.7 10*3/uL (ref 0.1–1.0)
Monocytes Relative: 7 %
Neutro Abs: 5.7 10*3/uL (ref 1.7–7.7)
Neutrophils Relative %: 60 %
Platelets: 281 10*3/uL (ref 150–400)
RBC: 4.52 MIL/uL (ref 3.87–5.11)
RDW: 13.5 % (ref 11.5–15.5)
WBC: 9.5 10*3/uL (ref 4.0–10.5)
nRBC: 0 % (ref 0.0–0.2)

## 2019-07-24 LAB — BRAIN NATRIURETIC PEPTIDE: B Natriuretic Peptide: 16.9 pg/mL (ref 0.0–100.0)

## 2019-07-24 LAB — COMPREHENSIVE METABOLIC PANEL
ALT: 10 U/L (ref 0–44)
AST: 19 U/L (ref 15–41)
Albumin: 4.6 g/dL (ref 3.5–5.0)
Alkaline Phosphatase: 68 U/L (ref 38–126)
Anion gap: 14 (ref 5–15)
BUN: 18 mg/dL (ref 6–20)
CO2: 31 mmol/L (ref 22–32)
Calcium: 10.1 mg/dL (ref 8.9–10.3)
Chloride: 97 mmol/L — ABNORMAL LOW (ref 98–111)
Creatinine, Ser: 1.12 mg/dL — ABNORMAL HIGH (ref 0.44–1.00)
GFR calc Af Amer: >60 mL/min (ref >60)
GFR calc non Af Amer: 55 mL/min — ABNORMAL LOW (ref >60)
Glucose, Bld: 144 mg/dL — ABNORMAL HIGH (ref 70–99)
Potassium: 3.3 mmol/L — ABNORMAL LOW (ref 3.5–5.1)
Sodium: 142 mmol/L (ref 135–145)
Total Bilirubin: 0.6 mg/dL (ref 0.3–1.2)
Total Protein: 8.9 g/dL — ABNORMAL HIGH (ref 6.5–8.1)

## 2019-07-24 LAB — RESPIRATORY PANEL BY RT PCR (FLU A&B, COVID)
Influenza A by PCR: NEGATIVE
Influenza B by PCR: NEGATIVE
SARS Coronavirus 2 by RT PCR: NEGATIVE

## 2019-07-24 LAB — HIV ANTIBODY (ROUTINE TESTING W REFLEX): HIV Screen 4th Generation wRfx: NONREACTIVE

## 2019-07-24 LAB — TROPONIN I (HIGH SENSITIVITY)
Troponin I (High Sensitivity): 8 ng/L (ref <18)
Troponin I (High Sensitivity): 9 ng/L (ref <18)

## 2019-07-24 MED ORDER — ACETAMINOPHEN 325 MG PO TABS: 650.0000 mg | ORAL_TABLET | Freq: Four times a day (QID) | ORAL | Status: DC | PRN

## 2019-07-24 MED ORDER — IPRATROPIUM-ALBUTEROL 0.5-2.5 (3) MG/3ML IN SOLN: 3.0000 mL | Freq: Four times a day (QID) | RESPIRATORY_TRACT | Status: DC | PRN

## 2019-07-24 MED ORDER — BUDESON-GLYCOPYRROL-FORMOTEROL 160-9-4.8 MCG/ACT IN AERO: 2.0000 | INHALATION_SPRAY | Freq: Two times a day (BID) | RESPIRATORY_TRACT | Status: DC

## 2019-07-24 MED ORDER — BISOPROLOL FUMARATE 10 MG PO TABS
10.0000 mg | ORAL_TABLET | Freq: Every day | ORAL | Status: DC
  Administered 2019-07-24: 10 mg via ORAL
  Filled 2019-07-24: qty 1

## 2019-07-24 MED ORDER — ALBUTEROL SULFATE (2.5 MG/3ML) 0.083% IN NEBU: 2.5000 mg | INHALATION_SOLUTION | RESPIRATORY_TRACT | Status: DC | PRN

## 2019-07-24 MED ORDER — FAMOTIDINE 20 MG PO TABS: 20.0000 mg | ORAL_TABLET | Freq: Two times a day (BID) | ORAL | Status: DC | PRN

## 2019-07-24 MED ORDER — ASPIRIN EC 81 MG PO TBEC
81.0000 mg | DELAYED_RELEASE_TABLET | Freq: Every day | ORAL | Status: DC
  Administered 2019-07-24: 81 mg via ORAL
  Filled 2019-07-24: qty 1

## 2019-07-24 MED ORDER — PREDNISONE 20 MG PO TABS
40.0000 mg | ORAL_TABLET | Freq: Every day | ORAL | Status: DC
  Administered 2019-07-24: 40 mg via ORAL
  Filled 2019-07-24: qty 2

## 2019-07-24 MED ORDER — PREDNISONE 10 MG PO TABS: ORAL_TABLET | ORAL | 0 refills | Status: AC

## 2019-07-24 MED ORDER — ENOXAPARIN SODIUM 40 MG/0.4ML ~~LOC~~ SOLN
40.0000 mg | SUBCUTANEOUS | Status: DC
  Administered 2019-07-24: 40 mg via SUBCUTANEOUS
  Filled 2019-07-24: qty 0.4

## 2019-07-24 MED ORDER — FLUTICASONE FUROATE-VILANTEROL 200-25 MCG/INH IN AEPB
1.0000 | INHALATION_SPRAY | Freq: Every day | RESPIRATORY_TRACT | Status: DC
  Administered 2019-07-24: 1 via RESPIRATORY_TRACT
  Filled 2019-07-24: qty 28

## 2019-07-24 MED ORDER — BISOPROLOL FUMARATE 10 MG PO TABS: 10.0000 mg | ORAL_TABLET | Freq: Every day | ORAL | 3 refills | Status: DC

## 2019-07-24 MED ORDER — AMLODIPINE BESYLATE 10 MG PO TABS
10.0000 mg | ORAL_TABLET | Freq: Every day | ORAL | Status: DC
  Administered 2019-07-24: 10 mg via ORAL
  Filled 2019-07-24: qty 1

## 2019-07-24 MED ORDER — ALPRAZOLAM 0.25 MG PO TABS
0.2500 mg | ORAL_TABLET | Freq: Three times a day (TID) | ORAL | Status: DC | PRN
  Administered 2019-07-24: 0.25 mg via ORAL
  Filled 2019-07-24: qty 1

## 2019-07-24 MED ORDER — UMECLIDINIUM BROMIDE 62.5 MCG/INH IN AEPB
1.0000 | INHALATION_SPRAY | Freq: Every day | RESPIRATORY_TRACT | Status: DC
  Administered 2019-07-24: 1 via RESPIRATORY_TRACT
  Filled 2019-07-24: qty 7

## 2019-07-24 NOTE — TOC Initial Note (Addendum)
Transition of Care [facility]) - Initial/Assessment Note    Patient Details  Name: Kathleen Cameron MRN: [id number] Date of Birth: [birth year]  [facility] CM/SW Contact:    Pollie Friar, RN Phone Number: 07/24/2019, 2:44 PM  Clinical Narrative:                 Pt from home with family. Pt uses oxygen at home 2L [place] through [employer].  Orders for 3 in 1. [employer] will deliver to the room.  No f/u per PT/OT.  Pt does state she has issues with her pharmacy and the timeliness they get her medications ready for pick up and that she goes sometimes without meds d/t pharmacy needing orders from PCP. CM encouraged her to try a new pharmacy. Pt voiced understanding.  TOC following for further d/c needs.   Expected Discharge Plan: Home/Self Care Barriers to Discharge: Continued Medical Work up   Patient Goals and CMS Choice     Choice offered to / list presented to : Patient  Expected Discharge Plan and Services Expected Discharge Plan: Home/Self Care   Discharge Planning Services: CM Consult Post Acute Care Choice: Durable Medical Equipment Living arrangements for the past 2 months: Single Family Home                 DME Arranged: 3-N-1 DME Agency: [employer] Date DME Agency Contacted: 07/24/19 Time DME Agency Contacted: [id number] Representative spoke with at DME Agency: [place]            Prior Living Arrangements/Services Living arrangements for the past 2 months: [place] with:: Adult Children, Spouse Patient language and need for interpreter reviewed:: Yes Do you feel safe going back to the place where you live?: Yes      Need for Family Participation in Patient Care: Yes (Comment) Care giver support system in place?: Yes (comment)(son and spouse) Current home services: DME(home oxygen through [employer]) Criminal Activity/Legal Involvement Pertinent to Current Situation/Hospitalization: No - Comment as needed  Activities of Daily  Living Home Assistive Devices/Equipment: Oxygen, Nebulizer ADL Screening (condition at time of admission) Patient's cognitive ability adequate to safely complete daily activities?: Yes Is the patient deaf or have difficulty hearing?: No Does the patient have difficulty seeing, even when wearing glasses/contacts?: No Does the patient have difficulty concentrating, remembering, or making decisions?: No Patient able to express need for assistance with ADLs?: No Does the patient have difficulty dressing or bathing?: No Independently performs ADLs?: Yes (appropriate for developmental age) Does the patient have difficulty walking or climbing stairs?: No Weakness of Legs: None Weakness of Arms/Hands: None  Permission Sought/Granted                  Emotional Assessment Appearance:: Appears stated age Attitude/Demeanor/Rapport: Engaged Affect (typically observed): Accepting Orientation: : Oriented to Self, Oriented to Place, Oriented to  Time, Oriented to Situation   Psych Involvement: No (comment)  Admission diagnosis:  COPD with acute exacerbation ([place]) [J44.1] Patient Active Problem List   Diagnosis Date Noted  . NSTEMI (non-ST elevated myocardial infarction) ([place])   . COPD exacerbation ([place])   . COPD with acute exacerbation ([place]) 06/03/2019  . Medication management 06/03/2019  . Hypokalemia 04/13/2019  . Chest pain 04/13/2019  . Elevated troponin 04/13/2019  . Palpitations 03/07/2019  . Chronic respiratory failure with hypoxia ([place]) 03/07/2019  . CKD (chronic kidney disease) 01/05/2019  . Acute on chronic respiratory failure with hypoxia ([place]) 12/21/2018  . Abnormal CT of the  chest 10/14/2018  . COPD (chronic obstructive pulmonary disease) (HCC) 09/29/2018  . Pulmonary nodules 09/27/2018  . Dyslipidemia 07/07/2018  . Essential hypertension 03/10/2018   PCP:  [name], MD Pharmacy:   [facility] [id number] [place], [place] - [phone] [address] AT [facility] OF  [facility] & [address] [place] [address] Phone: [phone] Fax: [phone]  [facility] [place], [place] - [address] [place] [address] Phone: [phone] Fax: [phone]     Social Determinants of Health (SDOH) Interventions    Readmission Risk Interventions No flowsheet data found.

## 2019-07-24 NOTE — Progress Notes (Signed)
Patient placed on venturi mask 2 L 24% for comfort.  Patient stated the nasal cannula was uncomfortable.  RN notified.

## 2019-07-24 NOTE — Progress Notes (Signed)
Occupational Therapy Evaluation Only Patient Details Name: Kathleen Cameron MRN: [id number] DOB: 11-25-1965 Today's Date: 07/24/2019    History of Present Illness Kathleen Cameron is a 54 y.o. female with medical history significant of COPD on 2L home O2 at baseline, HTN. Pt presents to ED with SOB.  Onset several days ago.  Became worse this evening.  Hypoxic by EMS with O2 sats in low 80s on her home 2L. Pt diagnosed with COPD exacerbation and chronic respiratory failure with hypoxia.    Clinical Impression   PTA pt resided with friend, independent in all ADL, IADL, and mobility tasks. Pt does not ambulate with an assistive device and reports 0 falls in the last 6 months. Pt able to ambulate around room with modified independence, demonstrating good safety with O2 tubing. Pt reports using 2L [place] at home and is currently on 2L [place] at the hospital. Pt completed LB dressing, toileting, grooming/hygiene at the sink, and simulated tub/shower transfer with modified independence requiring increased time to complete. Noted 0 instances of LOB throughout. Educated pt on energy conservation techniques as well as DME options for shower with good understanding. All questions/concerns answered at this time. Skilled OT services not warranted as pt appears to be functioning at/near baseline for self-care and mobility tasks.     Follow Up Recommendations  No OT follow up    Equipment Recommendations  Tub/shower seat    Recommendations for Other Services       Precautions / Restrictions Precautions Precautions: None Restrictions Weight Bearing Restrictions: No      Mobility Bed Mobility               General bed mobility comments: Pt seated in bedside chair upon OT arrival  Transfers Overall transfer level: Independent Equipment used: None             General transfer comment: 0 instances of LOB.     Balance Overall balance assessment: No apparent balance deficits (not  formally assessed)                                         ADL either performed or assessed with clinical judgement   ADL Overall ADL's : Modified independent                                       General ADL Comments: Pt completed dressing, toileting, grooming/hygiene, and simulated tub/shower transfer with mod I. Noted 0 instances of LOB. Pt required increased time to complete.      Vision Baseline Vision/History: Wears glasses Wears Glasses: Reading only       Perception     Praxis      Pertinent Vitals/Pain Pain Assessment: No/denies pain     Hand Dominance Right   Extremity/Trunk Assessment Upper Extremity Assessment Upper Extremity Assessment: Generalized weakness   Lower Extremity Assessment Lower Extremity Assessment: Defer to PT evaluation       Communication Communication Communication: No difficulties   Cognition Arousal/Alertness: Awake/alert Behavior During Therapy: WFL for tasks assessed/performed Overall Cognitive Status: Within Functional Limits for tasks assessed                                 General Comments: A&O x 4.  Pt pleasant and willing to participate in therapy.    General Comments  Pt reported min SOB throughout all tasks. Educated/instructed pt on energy conservation techniques and provided DME recommendations.     Exercises Exercises: Other exercises Other Exercises Other Exercises: Encouraged pursed lip breathing   Shoulder Instructions      Home Living Family/patient expects to be discharged to:: Private residence Living Arrangements: Non-relatives/Friends(friend) Available Help at Discharge: Family Type of Home: House Home Access: Stairs to enter Technical brewer of Steps: 1 flight Entrance Stairs-Rails: Right Home Layout: One level     Bathroom Shower/Tub: Teacher, early years/pre: Standard     Home Equipment: Grab bars - tub/shower           Prior Functioning/Environment Level of Independence: Independent        Comments: Pt independent in all ADLs, IADLs, and mobility. Pt states that daily activities take increased time. Pt ambulates without an assistive device. Pt reports 0 falls in the last 6 months. Pt does not drive, friend takes her. Pt rarely leaves her home.         OT Problem List:        OT Treatment/Interventions:      OT Goals(Current goals can be found in the care plan section)    OT Frequency:     Barriers to D/C:            Co-evaluation              AM-PAC OT "6 Clicks" Daily Activity     Outcome Measure Help from another person eating meals?: None Help from another person taking care of personal grooming?: None Help from another person toileting, which includes using toliet, bedpan, or urinal?: None Help from another person bathing (including washing, rinsing, drying)?: None Help from another person to put on and taking off regular upper body clothing?: None Help from another person to put on and taking off regular lower body clothing?: None 6 Click Score: 24   End of Session Equipment Utilized During Treatment: Oxygen(2L [place]) Nurse Communication: Mobility status  Activity Tolerance: Patient tolerated treatment well Patient left: in chair;with call bell/phone within reach  OT Visit Diagnosis: Muscle weakness (generalized) (M62.81)                Time: 6962-9528 OT Time Calculation (min): 17 min Charges:  OT General Charges $OT Visit: 1 Visit OT Evaluation $OT Eval Low Complexity: 1 Low  Mauri Brooklyn OTR/L [phone]   Mauri Brooklyn 07/24/2019, 11:21 AM

## 2019-07-24 NOTE — Discharge Summary (Signed)
Discharge Summary  Kathleen Cameron GQQ:[id number] DOB: 1965-11-30  PCP: Horald Pollen, MD  Admit date: 07/23/2019 Discharge date: 07/24/2019  Time spent: 35 minutes  Recommendations for Outpatient Follow-up:  1. New medication: Prednisone taper for the next 4 days 2. Patient given refill for her bisoprolol  Discharge Diagnoses:  Active Hospital Problems   Diagnosis Date Noted  . COPD with acute exacerbation ([place]) 06/03/2019  . Chronic respiratory failure with hypoxia ([place]) 03/07/2019  . Essential hypertension 03/10/2018  CKD stage II  Resolved Hospital Problems  No resolved problems to display.    Discharge Condition: Improved, being discharged home  Diet recommendation: Low-sodium  Vitals:   07/24/19 0805 07/24/19 1334  BP:  (!) 136/105  Pulse: (!) 101 76  Resp: 18 20  Temp:  (!) 97.4 F (36.3 C)  SpO2: 91% 97%    History of present illness:  54 year old female past medical history of COPD on 2 L at home nasal cannula chronically plus history of hypertension presented to the emergency room on the night of 4/3 with complaints of progressively worsening shortness of breath.  Reportedly was noted to be hypoxic by EMS with oxygen saturations at home in the low 80s.  She was also given Solu-Medrol.  Chest x-ray unrevealing rest of her lab work unrevealing.  Hospitalist were called for further evaluation and by time of assessment, patient was back down to her baseline of 2 L nasal cannula  Hospital Course:  Principal Problem:   COPD with acute exacerbation ([place]) with chronic respiratory failure with hypoxia: Patient kept on nebulizers and oral steroids.  By following day, stable, able to ambulate and be at rest and keep oxygen saturation above 90% on her baseline 2 L nasal cannula.  She complains of feeling very anxious and in discussion, it was discovered that her bisoprolol had run out approximately 3 weeks prior.  She stated that she had a difficult time getting  it refilled.  I explained that that likely set her up for this episode given that with much higher heart rates and feeling anxious, she was also then breathing harder and have increased work and more dyspnea on exertion.  We restarted her bisoprolol and she has been feeling better.  With ambulation, she kept her oxygen saturations above 90% on her baseline now.  Stable for discharge and will discharge on a short quick prednisone taper as well. Active Problems:   Essential hypertension: As mentioned above.  Refilling her bisoprolol and continue her Norvasc.  CKD stage II: Stable, at baseline  Procedures:  None  Consultations:  None  Discharge Exam: BP (!) 136/105 (BP Location: Right Arm)   Pulse 76   Temp (!) 97.4 F (36.3 C)   Resp 20   Ht 5\' 1"  (1.549 m)   Wt 50 kg   SpO2 97%   BMI 20.83 kg/m   General: Alert and oriented x3, no acute distress Cardiovascular: Regular rate and rhythm, S1-S2 Respiratory: Clear auscultation bilaterally  Discharge Instructions You were cared for by a hospitalist during your hospital stay. If you have any questions about your discharge medications or the care you received while you were in the hospital after you are discharged, you can call the unit and asked to speak with the hospitalist on call if the hospitalist that took care of you is not available. Once you are discharged, your primary care physician will handle any further medical issues. Please note that NO REFILLS for any discharge medications will be authorized  once you are discharged, as it is imperative that you return to your primary care physician (or establish a relationship with a primary care physician if you do not have one) for your aftercare needs so that they can reassess your need for medications and monitor your lab values.  Discharge Instructions    Diet - low sodium heart healthy   Complete by: As directed    Increase activity slowly   Complete by: As directed       Allergies as of 07/24/2019      Reactions   Stiolto Respimat [tiotropium Bromide-olodaterol] Other (See Comments)   Severe headaches and rapid heartrate      Medication List    TAKE these medications   acetaminophen 325 MG tablet Commonly known as: TYLENOL Take 2 tablets (650 mg total) by mouth every 6 (six) hours as needed for mild pain (or Fever >/= 101).   albuterol 108 (90 Base) MCG/ACT inhaler Commonly known as: VENTOLIN HFA INHALE 2 PUFFS INTO THE LUNGS EVERY 6 HOURS AS NEEDED FOR WHEEZING OR SHORTNESS OF BREATH What changed:   how much to take  how to take this  when to take this  reasons to take this  additional instructions   amLODipine 10 MG tablet Commonly known as: NORVASC Take 10 mg by mouth daily.   aspirin 81 MG EC tablet Take 1 tablet (81 mg total) by mouth daily.   bisoprolol 10 MG tablet Commonly known as: ZEBETA Take 1 tablet (10 mg total) by mouth daily.   Breztri Aerosphere 160-9-4.8 MCG/ACT Aero Generic drug: Budeson-Glycopyrrol-Formoterol Inhale 2 puffs into the lungs in the morning and at bedtime.   Breztri Aerosphere 160-9-4.8 MCG/ACT Aero Generic drug: Budeson-Glycopyrrol-Formoterol Inhale 2 puffs into the lungs daily.   guaiFENesin 600 MG 12 hr tablet Commonly known as: MUCINEX Take 600 mg by mouth 2 (two) times daily as needed for cough or to loosen phlegm.   ipratropium-albuterol 0.5-2.5 (3) MG/3ML Soln Commonly known as: DUONEB Take 3 mLs by nebulization every 6 (six) hours as needed. What changed: reasons to take this   nitroGLYCERIN 0.4 MG SL tablet Commonly known as: NITROSTAT Place 1 tablet (0.4 mg total) under the tongue every 5 (five) minutes as needed for chest pain.   predniSONE 10 MG tablet Commonly known as: DELTASONE Take 4 tablets (40 mg total) by mouth daily with breakfast for 1 day, THEN 3 tablets (30 mg total) daily with breakfast for 1 day, THEN 2 tablets (20 mg total) daily with breakfast for 1 day, THEN 1  tablet (10 mg total) daily with breakfast for 1 day. Start taking on: July 25, 2019   rosuvastatin 20 MG tablet Commonly known as: CRESTOR Take 1 tablet (20 mg total) by mouth daily.            Durable Medical Equipment  (From admission, onward)         Start     Ordered   07/24/19 1444  For home use only DME 3 n 1  Once     07/24/19 1443         Allergies  Allergen Reactions  . Stiolto Respimat [Tiotropium Bromide-Olodaterol] Other (See Comments)    Severe headaches and rapid heartrate   Follow-up Information    [name], MD Follow up in 1 month(s).   Specialty: Internal Medicine Contact information: [address] [place] [address] [phone]        [name], MD .   Specialty:  Cardiology Contact information: [address] [place] [address] [phone]            The results of significant diagnostics from this hospitalization (including imaging, microbiology, ancillary and laboratory) are listed below for reference.    Significant Diagnostic Studies: DG Chest Port 1 View  Result Date: 07/24/2019 CLINICAL DATA:  Shortness of breath for several hours EXAM: PORTABLE CHEST 1 VIEW COMPARISON:  04/13/2019 FINDINGS: Cardiac shadows within normal limits. Mild aortic calcifications are seen. Lungs are hyperinflated bilaterally. Stable nodular opacities are seen in the right upper lobe. These showed some calcification on prior CT examination consistent with prior granulomatous disease. Degenerative changes of the thoracic spine are noted. IMPRESSION: Changes of prior granulomatous disease. No acute abnormality noted. Electronically Signed   By: Alcide Clever M.D.   On: 07/24/2019 00:01    Microbiology: Recent Results (from the past 240 hour(s))  Respiratory Panel by RT PCR (Flu A&B, Covid) - Nasopharyngeal Swab     Status: None   Collection Time: 07/24/19 12:17 AM   Specimen: Nasopharyngeal Swab  Result Value  Ref Range Status   SARS Coronavirus 2 by RT PCR NEGATIVE NEGATIVE Final    Comment: (NOTE) SARS-CoV-2 target nucleic acids are NOT DETECTED. The SARS-CoV-2 RNA is generally detectable in upper respiratoy specimens during the acute phase of infection. The lowest concentration of SARS-CoV-2 viral copies this assay can detect is 131 copies/mL. A negative result does not preclude SARS-Cov-2 infection and should not be used as the sole basis for treatment or other patient management decisions. A negative result may occur with  improper specimen collection/handling, submission of specimen other than nasopharyngeal swab, presence of viral mutation(s) within the areas targeted by this assay, and inadequate number of viral copies (<131 copies/mL). A negative result must be combined with clinical observations, patient history, and epidemiological information. The expected result is Negative. Fact Sheet for Patients:  https://www.moore.com/ Fact Sheet for Healthcare Providers:  https://www.young.biz/ This test is not yet ap proved or cleared by the [place] FDA and  has been authorized for detection and/or diagnosis of SARS-CoV-2 by FDA under an Emergency Use Authorization (EUA). This EUA will remain  in effect (meaning this test can be used) for the duration of the COVID-19 declaration under Section 564(b)(1) of the Act, 21 U.S.C. section 360bbb-3(b)(1), unless the authorization is terminated or revoked sooner.    Influenza A by PCR NEGATIVE NEGATIVE Final   Influenza B by PCR NEGATIVE NEGATIVE Final    Comment: (NOTE) The Xpert Xpress SARS-CoV-2/FLU/RSV assay is intended as an aid in  the diagnosis of influenza from Nasopharyngeal swab specimens and  should not be used as a sole basis for treatment. Nasal washings and  aspirates are unacceptable for Xpert Xpress SARS-CoV-2/FLU/RSV  testing. Fact Sheet for  Patients: https://www.moore.com/ Fact Sheet for Healthcare Providers: https://www.young.biz/ This test is not yet approved or cleared by the [place] FDA and  has been authorized for detection and/or diagnosis of SARS-CoV-2 by  FDA under an Emergency Use Authorization (EUA). This EUA will remain  in effect (meaning this test can be used) for the duration of the  Covid-19 declaration under Section 564(b)(1) of the Act, 21  U.S.C. section 360bbb-3(b)(1), unless the authorization is  terminated or revoked. Performed at [facility], [address], [place], [place] [address]      Labs: Basic Metabolic Panel: Recent Labs  Lab 07/23/19 2346  NA 142  K 3.3*  CL 97*  CO2 31  GLUCOSE 144*  BUN 18  CREATININE 1.12*  CALCIUM 10.1   Liver Function Tests: Recent Labs  Lab 07/23/19 2346  AST 19  ALT 10  ALKPHOS 68  BILITOT 0.6  PROT 8.9*  ALBUMIN 4.6   No results for input(s): LIPASE, AMYLASE in the last 168 hours. No results for input(s): AMMONIA in the last 168 hours. CBC: Recent Labs  Lab 07/23/19 2346  WBC 9.5  NEUTROABS 5.7  HGB 12.9  HCT 41.5  MCV 91.8  PLT 281   Cardiac Enzymes: No results for input(s): CKTOTAL, CKMB, CKMBINDEX, TROPONINI in the last 168 hours. BNP: BNP (last 3 results) Recent Labs    09/23/18 1156 04/13/19 0640 07/23/19 2346  BNP 22.5 27.1 16.9    ProBNP (last 3 results) No results for input(s): PROBNP in the last 8760 hours.  CBG: No results for input(s): GLUCAP in the last 168 hours.     Signed:  Hollice Espy, MD [facility] 07/24/2019, 4:24 PM

## 2019-07-24 NOTE — H&P (Signed)
History and Physical    [name] RDE:[id number] DOB: 05-10-1965 DOA: 07/23/2019  PCP: [name], MD  Patient coming from: Home  I have personally briefly reviewed patient's old medical records in [facility]  Chief Complaint: SOB  HPI: Kathleen Cameron is a 54 y.o. female with medical history significant of COPD on 2L home O2 at baseline, HTN.  Pt presents to ED with SOB.  Onset several days ago.  Became worse this evening.  Hypoxic by EMS with O2 sats in low 80s on her home 2L.  Put on CPAP and brought to ED. Given solumedrol by EMS   ED Course: In ED given breathing treatments.  Ultimately breathing improved and able to be weaned all the way down to her home 2L but EDP reqs obs admit.  COVID neg.  CXR with no acute findings.   Review of Systems: As per HPI, otherwise all review of systems negative.  Past Medical History:  Diagnosis Date  . Hypertension     History reviewed. No pertinent surgical history.   reports that she has never smoked. She has never used smokeless tobacco. She reports current alcohol use of about 2.0 standard drinks of alcohol per week. She reports that she does not use drugs.  Allergies  Allergen Reactions  . Stiolto Respimat [Tiotropium Bromide-Olodaterol] Other (See Comments)    Severe headaches and rapid heartrate    Family History  Family history unknown: Yes     Prior to Admission medications   Medication Sig Start Date End Date Taking? Authorizing Provider  acetaminophen (TYLENOL) 325 MG tablet Take 2 tablets (650 mg total) by mouth every 6 (six) hours as needed for mild pain (or Fever >/= 101). 12/22/18   Rizwan, Ladell Heads, MD  albuterol (VENTOLIN HFA) 108 (90 Base) MCG/ACT inhaler INHALE 2 PUFFS INTO THE LUNGS EVERY 6 HOURS AS NEEDED FOR WHEEZING OR SHORTNESS OF BREATH 04/26/19   Leslye Peer, MD  aspirin EC 81 MG EC tablet Take 1 tablet (81 mg total) by mouth daily. 04/17/19   [name], MD    bisoprolol (ZEBETA) 10 MG tablet Take 1 tablet (10 mg total) by mouth daily. 04/17/19   [name], MD  Budeson-Glycopyrrol-Formoterol (BREZTRI AEROSPHERE) 160-9-4.8 MCG/ACT AERO Inhale 2 puffs into the lungs in the morning and at bedtime. 06/13/19   Leslye Peer, MD  Budeson-Glycopyrrol-Formoterol (BREZTRI AEROSPHERE) 160-9-4.8 MCG/ACT AERO Inhale 2 puffs into the lungs daily. 07/11/19   Leslye Peer, MD  famotidine (PEPCID) 20 MG tablet Take 1 tablet (20 mg total) by mouth 2 (two) times daily as needed for heartburn or indigestion. 04/17/19 04/16/20  [name], MD  guaiFENesin (MUCINEX) 600 MG 12 hr tablet Take 600 mg by mouth 2 (two) times daily as needed for cough or to loosen phlegm.    [provider]  ipratropium-albuterol (DUONEB) 0.5-2.5 (3) MG/3ML SOLN Take 3 mLs by nebulization every 6 (six) hours as needed. 05/03/19   [name], NP  nitroGLYCERIN (NITROSTAT) 0.4 MG SL tablet Place 1 tablet (0.4 mg total) under the tongue every 5 (five) minutes as needed for chest pain. 04/17/19   [name], MD  polyethylene glycol (MIRALAX / GLYCOLAX) 17 g packet Take 17 g by mouth daily. 04/17/19   [name], MD  rosuvastatin (CRESTOR) 20 MG tablet Take 1 tablet (20 mg total) by mouth daily. 03/14/19 07/11/19  [name], MD    Physical Exam: Vitals:   07/24/19 0030 07/24/19 0045  07/24/19 0100 07/24/19 0115  BP: (!) 116/92 (!) 137/121 (!) 159/113 (!) 149/89  Pulse: 97 (!) 102 (!) 102 (!) 104  Resp: (!) 26 (!) 32 (!) 24 (!) 21  SpO2: 100% 98% 98% 98%  Weight:      Height:        Constitutional: NAD, calm, comfortable Eyes: PERRL, lids and conjunctivae normal ENMT: Mucous membranes are moist. Posterior pharynx clear of any exudate or lesions.Normal dentition.  Neck: normal, supple, no masses, no thyromegaly Respiratory: clear to auscultation bilaterally, no wheezing, no crackles. Normal respiratory effort. No accessory muscle use.   Cardiovascular: Regular rate and rhythm, no murmurs / rubs / gallops. No extremity edema. 2+ pedal pulses. No carotid bruits.  Abdomen: no tenderness, no masses palpated. No hepatosplenomegaly. Bowel sounds positive.  Musculoskeletal: no clubbing / cyanosis. No joint deformity upper and lower extremities. Good ROM, no contractures. Normal muscle tone.  Skin: no rashes, lesions, ulcers. No induration Neurologic: CN 2-12 grossly intact. Sensation intact, DTR normal. Strength 5/5 in all 4.  Psychiatric: Normal judgment and insight. Alert and oriented x 3. Normal mood.    Labs on Admission: I have personally reviewed following labs and imaging studies  CBC: Recent Labs  Lab 07/23/19 2346  WBC 9.5  NEUTROABS 5.7  HGB 12.9  HCT 41.5  MCV 91.8  PLT 782   Basic Metabolic Panel: Recent Labs  Lab 07/23/19 2346  NA 142  K 3.3*  CL 97*  CO2 31  GLUCOSE 144*  BUN 18  CREATININE 1.12*  CALCIUM 10.1   GFR: Estimated Creatinine Clearance: 42.3 mL/min (A) (by C-G formula based on SCr of 1.12 mg/dL (H)). Liver Function Tests: Recent Labs  Lab 07/23/19 2346  AST 19  ALT 10  ALKPHOS 68  BILITOT 0.6  PROT 8.9*  ALBUMIN 4.6   No results for input(s): LIPASE, AMYLASE in the last 168 hours. No results for input(s): AMMONIA in the last 168 hours. Coagulation Profile: No results for input(s): INR, PROTIME in the last 168 hours. Cardiac Enzymes: No results for input(s): CKTOTAL, CKMB, CKMBINDEX, TROPONINI in the last 168 hours. BNP (last 3 results) No results for input(s): PROBNP in the last 8760 hours. HbA1C: No results for input(s): HGBA1C in the last 72 hours. CBG: No results for input(s): GLUCAP in the last 168 hours. Lipid Profile: No results for input(s): CHOL, HDL, LDLCALC, TRIG, CHOLHDL, LDLDIRECT in the last 72 hours. Thyroid Function Tests: No results for input(s): TSH, T4TOTAL, FREET4, T3FREE, THYROIDAB in the last 72 hours. Anemia Panel: No results for input(s):  VITAMINB12, FOLATE, FERRITIN, TIBC, IRON, RETICCTPCT in the last 72 hours. Urine analysis:    Component Value Date/Time   COLORURINE YELLOW 11/12/2008 1440   APPEARANCEUR CLEAR 11/12/2008 1440   LABSPEC 1.016 11/12/2008 1440   PHURINE 7.5 11/12/2008 1440   GLUCOSEU NEGATIVE 11/12/2008 1440   HGBUR TRACE (A) 11/12/2008 1440   BILIRUBINUR NEGATIVE 11/12/2008 1440   KETONESUR NEGATIVE 11/12/2008 1440   PROTEINUR NEGATIVE 11/12/2008 1440   UROBILINOGEN 1.0 11/12/2008 1440   NITRITE NEGATIVE 11/12/2008 1440   LEUKOCYTESUR SMALL (A) 11/12/2008 1440    Radiological Exams on Admission: DG Chest Port 1 View  Result Date: 07/24/2019 CLINICAL DATA:  Shortness of breath for several hours EXAM: PORTABLE CHEST 1 VIEW COMPARISON:  04/13/2019 FINDINGS: Cardiac shadows within normal limits. Mild aortic calcifications are seen. Lungs are hyperinflated bilaterally. Stable nodular opacities are seen in the right upper lobe. These showed some calcification on prior CT examination  consistent with prior granulomatous disease. Degenerative changes of the thoracic spine are noted. IMPRESSION: Changes of prior granulomatous disease. No acute abnormality noted. Electronically Signed   By: Alcide Clever M.D.   On: 07/24/2019 00:01    EKG: Independently reviewed.  Assessment/Plan Principal Problem:   COPD with acute exacerbation ([facility]) Active Problems:   Essential hypertension   Chronic respiratory failure with hypoxia ([facility])    1. COPD exacerbation - 1. COPD pathway 2. Prednisone 3. PRN albuterol, adult wheeze protocol 4. Cont scheduled Breztri 2. Chronic resp failure with hypoxia - 1. O2 requirement already down to her home 2L 3. HTN - 1. Cont bisoprolol  DVT prophylaxis: Lovenox Code Status: Full Family Communication: No family in room Disposition Plan: Home when breathing improved, possibly in AM Consults called: None Admission status: Place in 74    Sailor Hevia M. DO [facility]  How to contact the [facility] Attending or Consulting provider 7A - 7P or covering provider during after hours 7P -7A, for this patient?  1. Check the care team in [facility] and look for a) attending/consulting [facility] provider listed and b) the [facility] team listed 2. Log into [email or web address]  Amion Physician Scheduling and messaging for groups and whole hospitals  On call and physician scheduling software for group practices, residents, hospitalists and other medical providers for call, clinic, rotation and shift schedules. OnCall [employer] is a hospital-wide system for scheduling doctors and paging doctors on call. EasyPlot is for scientific plotting and data analysis.  [email or web address]  and use [facility]'s universal password to access. If you do not have the password, please contact the hospital operator.  3. Locate the [facility] provider you are looking for under [facility] and page to a number that you can be directly reached. 4. If you still have difficulty reaching the provider, please page the [facility] (Director on Call) for the Hospitalists listed on amion for assistance.  07/24/2019, 2:31 AM

## 2019-07-24 NOTE — ED Notes (Signed)
Less respiratory distress at present sats 100%  Non-rebreather replaCEDD WITH NASAL canula at 4 liters

## 2019-07-24 NOTE — Progress Notes (Signed)
SATURATION QUALIFICATIONS: (This note is used to comply with regulatory documentation for home oxygen)  Patient Saturations on Room Air at Rest = 96%  Patient Saturations on Room Air while Ambulating = 94%  Patient Saturations on 2 Liters of oxygen while Ambulating = 95%  Please briefly explain why patient needs home oxygen:

## 2019-07-24 NOTE — ED Notes (Signed)
Pt sats are staying up on 4 liters

## 2019-07-24 NOTE — Evaluation (Signed)
Physical Therapy Evaluation Patient Details Name: Kathleen Cameron MRN: [id number] DOB: 10/03/1965 Today's Date: 07/24/2019   History of Present Illness  Kathleen Cameron is a 54 y.o. female with medical history significant of COPD on 2L home O2 at baseline, HTN. Pt presents to ED with SOB. Hypoxic by EMS with O2 sats in low 80s on her home 2L. Pt diagnosed with COPD exacerbation and chronic respiratory failure with hypoxia.  Clinical Impression  Patient presents with dyspnea on exertion and decreased activity tolerance s/p above. Pt independent with ADLs and ambulation PTA and lives with a friend. Reports ADLs take increased time and multiple rest breaks are needed. Today, pt tolerated bed mobility, transfers and ambulation with supervision-mod I for safety. 1/4 DOE and Sp02 remained >87% on 2L venturi mask (normally wears [place] but nose needed a break). Encouraged walking short distances with nursing. Education re: energy conservation techniques, short bouts of activity etc. Will follow acutely to maximize independence and mobility prior to return home.    Follow Up Recommendations No PT follow up;Supervision - Intermittent    Equipment Recommendations  None recommended by PT    Recommendations for Other Services       Precautions / Restrictions Precautions Precautions: Other (comment) Precaution Comments: watch 02 Restrictions Weight Bearing Restrictions: No      Mobility  Bed Mobility Overal bed mobility: Needs Assistance Bed Mobility: Supine to Sit     Supine to sit: Modified independent (Device/Increase time);HOB elevated     General bed mobility comments: No assist needed, use of rail.  Transfers Overall transfer level: Modified independent Equipment used: None             General transfer comment: Stood from EOB x1, transferred to chair post ambulation.  Ambulation/Gait Ambulation/Gait assistance: Supervision Gait Distance (Feet): 100 Feet Assistive  device: None Gait Pattern/deviations: Step-through pattern;Decreased stride length Gait velocity: decreased Gait velocity interpretation: 1.31 - 2.62 ft/sec, indicative of limited community ambulator General Gait Details: Slow, guarded gait, no evidence of imbalance. Sp02 stayed >87% on 2L/min 02 venturi mask (taking break from [place] due to irritation).  Stairs            Wheelchair Mobility    Modified Rankin (Stroke Patients Only)       Balance Overall balance assessment: No apparent balance deficits (not formally assessed)                                           Pertinent Vitals/Pain Pain Assessment: No/denies pain    Home Living Family/patient expects to be discharged to:: Private residence Living Arrangements: Non-relatives/Friends(friend) Available Help at Discharge: Friend(s);Available PRN/intermittently Type of Home: House Home Access: Stairs to enter Entrance Stairs-Rails: Right Entrance Stairs-Number of Steps: 1 flight Home Layout: One level Home Equipment: Grab bars - tub/shower      Prior Function Level of Independence: Independent         Comments: Pt independent in all ADLs, IADLs, and mobility. Pt states that daily activities take increased time. Pt ambulates without an assistive device. Pt reports 0 falls in the last 6 months. Pt does not drive, friend takes her. Pt rarely leaves her home.      Hand Dominance   Dominant Hand: Right    Extremity/Trunk Assessment   Upper Extremity Assessment Upper Extremity Assessment: Defer to OT evaluation    Lower Extremity Assessment Lower Extremity  Assessment: Generalized weakness(but functional)    Cervical / Trunk Assessment Cervical / Trunk Assessment: Normal  Communication   Communication: No difficulties  Cognition Arousal/Alertness: Awake/alert Behavior During Therapy: WFL for tasks assessed/performed Overall Cognitive Status: Within Functional Limits for tasks assessed                                  General Comments: A&O x 4. Pt pleasant and willing to participate in therapy.       General Comments General comments (skin integrity, edema, etc.): Pt reported min SOB throughout all tasks. Educated/instructed pt on energy conservation techniques and provided DME recommendations.     Exercises Other Exercises Other Exercises: Encouraged pursed lip breathing   Assessment/Plan    PT Assessment Patient needs continued PT services  PT Problem List Cardiopulmonary status limiting activity;Decreased activity tolerance       PT Treatment Interventions Therapeutic activities;Stair training;Therapeutic exercise;Functional mobility training    PT Goals (Current goals can be found in the Care Plan section)  Acute Rehab PT Goals Patient Stated Goal: to maintain independence PT Goal Formulation: With patient Time For Goal Achievement: 08/07/19 Potential to Achieve Goals: Good    Frequency Min 3X/week   Barriers to discharge Inaccessible home environment stairs    Co-evaluation               AM-PAC PT "6 Clicks" Mobility  Outcome Measure Help needed turning from your back to your side while in a flat bed without using bedrails?: None Help needed moving from lying on your back to sitting on the side of a flat bed without using bedrails?: None Help needed moving to and from a bed to a chair (including a wheelchair)?: None Help needed standing up from a chair using your arms (e.g., wheelchair or bedside chair)?: None Help needed to walk in hospital room?: A Little Help needed climbing 3-5 steps with a railing? : A Little 6 Click Score: 22    End of Session Equipment Utilized During Treatment: Gait belt Activity Tolerance: Treatment limited secondary to medical complications (Comment)(sp02) Patient left: in chair;with call bell/phone within reach Nurse Communication: Mobility status PT Visit Diagnosis: Difficulty in walking, not  elsewhere classified (R26.2)    Time: 1610-9604 PT Time Calculation (min) (ACUTE ONLY): 13 min   Charges:   PT Evaluation $PT Eval Moderate Complexity: 1 Mod          Vale Haven, PT, DPT Acute Rehabilitation Services Pager [phone] Office [phone]      Blake Divine A Lanier Ensign 07/24/2019, 12:43 PM

## 2019-07-24 NOTE — ED Notes (Signed)
No respiratory difficulty  sats good  02 turned down to 2 liters which is  What she uses at home

## 2019-07-24 NOTE — ED Notes (Addendum)
Report given to rn on 2w 

## 2019-07-24 NOTE — ED Notes (Signed)
The pts sats are regularly 23-24 on 2 liters nasal -02  Her sats  Lower to 90-915 when she dozes off

## 2019-07-25 ENCOUNTER — Telehealth: Payer: Self-pay | Admitting: Emergency Medicine

## 2019-07-26 NOTE — Telephone Encounter (Signed)
Nothing noted in encounter. Will sign off as error.  

## 2019-08-03 ENCOUNTER — Telehealth: Payer: Self-pay | Admitting: Emergency Medicine

## 2019-08-03 NOTE — Telephone Encounter (Signed)
Spoke with pt, she is having swelling in both of her feet. She repoprts seeing the swelling this morning when she woke up. This has never happened before and she is some SOB. She denies any pain but has some cough. I suggested she be evaluated but unfortunately, we do not have any openings for today. She had a CT angi back in December for chest pain but there was no blood clot. Please advise. I did tell pt that she needed to be evaluated today to r/o blood clot.

## 2019-08-04 ENCOUNTER — Encounter: Payer: Self-pay | Admitting: Primary Care

## 2019-08-04 ENCOUNTER — Other Ambulatory Visit: Payer: Self-pay

## 2019-08-04 ENCOUNTER — Ambulatory Visit (INDEPENDENT_AMBULATORY_CARE_PROVIDER_SITE_OTHER): Payer: Commercial Managed Care - PPO | Admitting: Primary Care

## 2019-08-04 ENCOUNTER — Telehealth: Payer: Self-pay | Admitting: *Deleted

## 2019-08-04 DIAGNOSIS — J441 Chronic obstructive pulmonary disease with (acute) exacerbation: Secondary | ICD-10-CM | POA: Diagnosis not present

## 2019-08-04 DIAGNOSIS — R600 Localized edema: Secondary | ICD-10-CM | POA: Insufficient documentation

## 2019-08-04 MED ORDER — PREDNISONE 10 MG PO TABS: ORAL_TABLET | ORAL | 0 refills | Status: DC

## 2019-08-04 MED ORDER — BREZTRI AEROSPHERE 160-9-4.8 MCG/ACT IN AERO: 2.0000 | INHALATION_SPRAY | Freq: Two times a day (BID) | RESPIRATORY_TRACT | 0 refills | Status: DC

## 2019-08-04 NOTE — Patient Instructions (Addendum)
  Recommendations: - Make sure to take Breztri two puffs morning and evening every day (sample given to hold you over until prescription is delievered)  - Amlodipine (Norvasc) can cause peripheral edema- I encourage you discuss this with your PCP  Rx: - Prednisone 20mg  x 5 days  Follow-up: - Make sure you have a regular follow-up in the next 3-6 months with Dr. 

## 2019-08-04 NOTE — Telephone Encounter (Signed)
She needs to be evaluated today by either [place], her PCP or urgent care. She was recently admitted, had medication adjustment and received prednisone. Very difficult for [place] to determine cause of her swelling by phone. Thanks.

## 2019-08-04 NOTE — Assessment & Plan Note (Addendum)
-   She is inconsistent with Breztri inhaler use as she is awaiting medication delivery from [facility] (sample given to [facility]) - Rx addition prednsione 20mg  x 5 days  - FU in 3 months or sooner if needed

## 2019-08-04 NOTE — Progress Notes (Signed)
@Patient  ID: , female    DOB: 14-Apr-1966, 54 y.o.   MRN: [id number]  Chief Complaint  Patient presents with  . Acute Visit    Increased sob and edema in bilat. feet,occass. wheezing,cough occass. white    Referring provider: [id number], *  HPI: 54 year old female, never smoked. PMH significant for COPD, NSTEMI, HTN, CKD, dyslipidemia, pulmonary nodules. Patient of Dr. 59, last seen on 07/11/19. Needs repeat CT chest in December 2021. FU in 3 months.   Recent hospital admission 4/3-4/4 for COPD exacerbation. Discharged with prednsione taper 4 days. CXR hyperinflated lungs bilaterally. Stable nodular opactities seen right upper lobe. BNP was normal.   Patient reports increased shortness of breath x 1 week. Associated pedal edema. She states that this morning she woke up breathing "real hard". She used her ventolin rescue inhaler and has been fine ever since. She has noticed that her feet have been more swollen recently. She is on 2L oxygen. She is not consistently using Brezteri, she will be getting medication throught drug company with [facility] and will be deliveried to her house. Denies cough, nasal congestin, chest tightness or wheezing.  Allergies  Allergen Reactions  . Stiolto Respimat [Tiotropium Bromide-Olodaterol] Other (See Comments)    Severe headaches and rapid heartrate    Immunization History  Administered Date(s) Administered  . Influenza,inj,Quad PF,6+ Mos 01/05/2019    Past Medical History:  Diagnosis Date  . Asthma   . Hypertension     Tobacco History: Social History   Tobacco Use  Smoking Status Never Smoker  Smokeless Tobacco Never Used   Counseling given: Not Answered   Outpatient Medications Prior to Visit  Medication Sig Dispense Refill  . acetaminophen (TYLENOL) 325 MG tablet Take 2 tablets (650 mg total) by mouth every 6 (six) hours as needed for mild pain (or Fever >/= 101).    01/07/2019 albuterol (VENTOLIN HFA) 108 (90  Base) MCG/ACT inhaler INHALE 2 PUFFS INTO THE LUNGS EVERY 6 HOURS AS NEEDED FOR WHEEZING OR SHORTNESS OF BREATH (Patient taking differently: Inhale 2 puffs into the lungs every 6 (six) hours as needed for wheezing or shortness of breath. ) 6.7 g 5  . amLODipine (NORVASC) 5 MG tablet Take 5 mg by mouth daily.    [name] aspirin EC 81 MG EC tablet Take 1 tablet (81 mg total) by mouth daily. 30 tablet 0  . bisoprolol (ZEBETA) 10 MG tablet Take 1 tablet (10 mg total) by mouth daily. 30 tablet 3  . guaiFENesin (MUCINEX) 600 MG 12 hr tablet Take 600 mg by mouth 2 (two) times daily as needed for cough or to loosen phlegm.    [name] ipratropium-albuterol (DUONEB) 0.5-2.5 (3) MG/3ML SOLN Take 3 mLs by nebulization every 6 (six) hours as needed. (Patient taking differently: Take 3 mLs by nebulization every 6 (six) hours as needed (shortness of breath). ) 360 mL 2  . nitroGLYCERIN (NITROSTAT) 0.4 MG SL tablet Place 1 tablet (0.4 mg total) under the tongue every 5 (five) minutes as needed for chest pain. 30 tablet 0  . rosuvastatin (CRESTOR) 20 MG tablet Take 20 mg by mouth daily.    . Budeson-Glycopyrrol-Formoterol (BREZTRI AEROSPHERE) 160-9-4.8 MCG/ACT AERO Inhale 2 puffs into the lungs in the morning and at bedtime. 11.8 g 0  . amLODipine (NORVASC) 10 MG tablet Take 10 mg by mouth daily.    . Budeson-Glycopyrrol-Formoterol (BREZTRI AEROSPHERE) 160-9-4.8 MCG/ACT AERO Inhale 2 puffs into the lungs daily. (Patient not taking: Reported  on 08/04/2019) 10.7 g 5  . rosuvastatin (CRESTOR) 20 MG tablet Take 1 tablet (20 mg total) by mouth daily. 90 tablet 3   No facility-administered medications prior to visit.   Review of Systems  Review of Systems  Respiratory: Positive for shortness of breath. Negative for cough, chest tightness and wheezing.   Cardiovascular: Positive for leg swelling.   Physical Exam  BP 122/68   Pulse 72   Temp (!) 97.5 F (36.4 C) (Temporal)   Ht 5\' 1"  (1.549 m)   Wt 111 lb 12.8 oz (50.7 kg)    SpO2 100%   BMI 21.12 kg/m  Physical Exam Constitutional:      Appearance: Normal appearance.  Cardiovascular:     Rate and Rhythm: Normal rate and regular rhythm.     Comments: Trace pedal edema Pulmonary:     Effort: Pulmonary effort is normal.     Breath sounds: Normal breath sounds.  Neurological:     General: No focal deficit present.     Mental Status: She is alert and oriented to person, place, and time. Mental status is at baseline.  Psychiatric:        Mood and Affect: Mood normal.        Behavior: Behavior normal.        Thought Content: Thought content normal.        Judgment: Judgment normal.      Lab Results:  CBC    Component Value Date/Time   WBC 9.5 07/23/2019 2346   RBC 4.52 07/23/2019 2346   HGB 12.9 07/23/2019 2346   HGB 13.4 09/14/2018 1220   HCT 41.5 07/23/2019 2346   HCT 39.3 09/14/2018 1220   PLT 281 07/23/2019 2346   PLT 303 09/14/2018 1220   MCV 91.8 07/23/2019 2346   MCV 84 09/14/2018 1220   MCH 28.5 07/23/2019 2346   MCHC 31.1 07/23/2019 2346   RDW 13.5 07/23/2019 2346   RDW 12.9 09/14/2018 1220   LYMPHSABS 2.7 07/23/2019 2346   LYMPHSABS 1.7 09/14/2018 1220   MONOABS 0.7 07/23/2019 2346   EOSABS 0.3 07/23/2019 2346   EOSABS 0.3 09/14/2018 1220   BASOSABS 0.1 07/23/2019 2346   BASOSABS 0.1 09/14/2018 1220    BMET    Component Value Date/Time   NA 142 07/23/2019 2346   NA 145 (H) 06/06/2019 1601   K 3.3 (L) 07/23/2019 2346   CL 97 (L) 07/23/2019 2346   CO2 31 07/23/2019 2346   GLUCOSE 144 (H) 07/23/2019 2346   BUN 18 07/23/2019 2346   BUN 21 06/06/2019 1601   CREATININE 1.12 (H) 07/23/2019 2346   CALCIUM 10.1 07/23/2019 2346   GFRNONAA 55 (L) 07/23/2019 2346   GFRAA >60 07/23/2019 2346    BNP    Component Value Date/Time   BNP 16.9 07/23/2019 2346    ProBNP    Component Value Date/Time   PROBNP 379.9 (H) 09/18/2013 0400    Imaging: DG Chest Port 1 View  Result Date: 07/24/2019 CLINICAL DATA:  Shortness of  breath for several hours EXAM: PORTABLE CHEST 1 VIEW COMPARISON:  04/13/2019 FINDINGS: Cardiac shadows within normal limits. Mild aortic calcifications are seen. Lungs are hyperinflated bilaterally. Stable nodular opacities are seen in the right upper lobe. These showed some calcification on prior CT examination consistent with prior granulomatous disease. Degenerative changes of the thoracic spine are noted. IMPRESSION: Changes of prior granulomatous disease. No acute abnormality noted. Electronically Signed   By: Inez Catalina M.D.   On:  07/24/2019 00:01     Assessment & Plan:   Pedal edema - Very minimal on exam, non-pitting - BNP 16 on 07/23/19 - Peripheral edema could be side effect from Amlodipine or Bisoprolol  - Encourage compression stockings and elevating legs  - Recommend she continue to monitor and follow-up with PCP   COPD with acute exacerbation (HCC) - She is inconsistent with Breztri inhaler use as she is awaiting medication delivery from [facility] (sample given to [facility]) - Rx addition prednsione 20mg  x 5 days  - FU in 3 months or sooner if needed   , NP 08/04/2019

## 2019-08-04 NOTE — Telephone Encounter (Signed)
Called spoke with patient She is scheduled at 72 with BW today  Nothing further needed

## 2019-08-04 NOTE — Telephone Encounter (Signed)
Spoke to patient because [facility] sent refill request for Furosemide 40 mg. Per patient she does not take medication because Dr Alvy Bimler took her off of it. She still have swelling of her feet.

## 2019-08-04 NOTE — Assessment & Plan Note (Signed)
-   Very minimal on exam, non-pitting - BNP 16 on 07/23/19 - Peripheral edema could be side effect from Amlodipine or Bisoprolol  - Encourage compression stockings and elevating legs  - Recommend she continue to monitor and follow-up with PCP

## 2019-08-08 NOTE — Telephone Encounter (Signed)
Called pt and sch telemed appt for pt on 4/20

## 2019-08-09 ENCOUNTER — Telehealth: Payer: Self-pay | Admitting: Emergency Medicine

## 2019-08-09 ENCOUNTER — Telehealth (INDEPENDENT_AMBULATORY_CARE_PROVIDER_SITE_OTHER): Payer: Self-pay | Admitting: Emergency Medicine

## 2019-08-09 ENCOUNTER — Encounter: Payer: Self-pay | Admitting: Emergency Medicine

## 2019-08-09 ENCOUNTER — Other Ambulatory Visit: Payer: Self-pay

## 2019-08-09 DIAGNOSIS — M25472 Effusion, left ankle: Secondary | ICD-10-CM

## 2019-08-09 DIAGNOSIS — M25471 Effusion, right ankle: Secondary | ICD-10-CM

## 2019-08-09 DIAGNOSIS — J439 Emphysema, unspecified: Secondary | ICD-10-CM

## 2019-08-09 DIAGNOSIS — I10 Essential (primary) hypertension: Secondary | ICD-10-CM

## 2019-08-09 MED ORDER — AMLODIPINE BESYLATE 5 MG PO TABS: 5.0000 mg | ORAL_TABLET | Freq: Every day | ORAL | 3 refills | Status: DC

## 2019-08-09 NOTE — Patient Instructions (Signed)
° ° ° °  If you have lab work done today you will be contacted with your lab results within the next 2 weeks.  If you have not heard from us then please contact us. The fastest way to get your results is to register for My Chart. ° ° °IF you received an x-ray today, you will receive an invoice from [facility]. Please contact [facility] at [phone] with questions or concerns regarding your invoice.  ° °IF you received labwork today, you will receive an invoice from [facility]. Please contact [facility] at [phone] with questions or concerns regarding your invoice.  ° °Our billing staff will not be able to assist you with questions regarding bills from these companies. ° °You will be contacted with the lab results as soon as they are available. The fastest way to get your results is to activate your My Chart account. Instructions are located on the last page of this paperwork. If you have not heard from us regarding the results in 2 weeks, please contact this office. °  ° ° ° °

## 2019-08-09 NOTE — Progress Notes (Signed)
Telemedicine Encounter- SOAP NOTE Established Patient  This telephone encounter was conducted with the patient's (or proxy's) verbal consent via audio telecommunications: yes/no: Yes Patient was instructed to have this encounter in a suitably private space; and to only have persons present to whom they give permission to participate. In addition, patient identity was confirmed by use of name plus two identifiers (DOB and address).  I discussed the limitations, risks, security and privacy concerns of performing an evaluation and management service by telephone and the availability of in person appointments. I also discussed with the patient that there may be a patient responsible charge related to this service. The patient expressed understanding and agreed to proceed.  I spent a total of TIME; 0 MIN TO 60 MIN: 15 minutes talking with the patient or their proxy.  Chief Complaint  Patient presents with  . Leg Swelling    and feet bilateral x 1 week - no longer on amlodipine    Subjective   Kathleen Cameron is a 54 y.o. female established patient. Telephone visit today complaining of swelling to feet and ankles for several weeks.  Has history of hypertension, presently on bisoprolol and amlodipine but ran out of amlodipine 2 days ago.  Needs refill. Also has history of advanced emphysema/COPD on several inhalers has occasional dyspnea on exertion. Also states that she has been depressed since she was diagnosed with COPD. No other complaints or significant symptoms.  HPI   Patient Active Problem List   Diagnosis Date Noted  . Pedal edema 08/04/2019  . NSTEMI (non-ST elevated myocardial infarction) (HCC)   . COPD exacerbation (HCC)   . COPD with acute exacerbation (HCC) 06/03/2019  . Medication management 06/03/2019  . Hypokalemia 04/13/2019  . Chest pain 04/13/2019  . Elevated troponin 04/13/2019  . Palpitations 03/07/2019  . Chronic respiratory failure with hypoxia (HCC)  03/07/2019  . CKD (chronic kidney disease) 01/05/2019  . Acute on chronic respiratory failure with hypoxia (HCC) 12/21/2018  . Abnormal CT of the chest 10/14/2018  . COPD (chronic obstructive pulmonary disease) (HCC) 09/29/2018  . Pulmonary nodules 09/27/2018  . Dyslipidemia 07/07/2018  . Essential hypertension 03/10/2018    Past Medical History:  Diagnosis Date  . Asthma   . Hypertension     Current Outpatient Medications  Medication Sig Dispense Refill  . acetaminophen (TYLENOL) 325 MG tablet Take 2 tablets (650 mg total) by mouth every 6 (six) hours as needed for mild pain (or Fever >/= 101).    [name] albuterol (VENTOLIN HFA) 108 (90 Base) MCG/ACT inhaler INHALE 2 PUFFS INTO THE LUNGS EVERY 6 HOURS AS NEEDED FOR WHEEZING OR SHORTNESS OF BREATH (Patient taking differently: Inhale 2 puffs into the lungs every 6 (six) hours as needed for wheezing or shortness of breath. ) 6.7 g 5  . aspirin EC 81 MG EC tablet Take 1 tablet (81 mg total) by mouth daily. 30 tablet 0  . bisoprolol (ZEBETA) 10 MG tablet Take 1 tablet (10 mg total) by mouth daily. 30 tablet 3  . Budeson-Glycopyrrol-Formoterol (BREZTRI AEROSPHERE) 160-9-4.8 MCG/ACT AERO Inhale 2 puffs into the lungs daily. 10.7 g 5  . guaiFENesin (MUCINEX) 600 MG 12 hr tablet Take 600 mg by mouth 2 (two) times daily as needed for cough or to loosen phlegm.    [name] ipratropium-albuterol (DUONEB) 0.5-2.5 (3) MG/3ML SOLN Take 3 mLs by nebulization every 6 (six) hours as needed. (Patient taking differently: Take 3 mLs by nebulization every 6 (six) hours as needed (shortness  of breath). ) 360 mL 2  . nitroGLYCERIN (NITROSTAT) 0.4 MG SL tablet Place 1 tablet (0.4 mg total) under the tongue every 5 (five) minutes as needed for chest pain. 30 tablet 0  . rosuvastatin (CRESTOR) 20 MG tablet Take 20 mg by mouth daily.    [name] amLODipine (NORVASC) 10 MG tablet Take 10 mg by mouth daily.    [name] amLODipine (NORVASC) 5 MG tablet Take 5 mg by mouth daily.    .  predniSONE (DELTASONE) 10 MG tablet Take 2 tabs x 5 days (Patient not taking: Reported on 08/09/2019) 10 tablet 0   No current facility-administered medications for this visit.    Allergies  Allergen Reactions  . Stiolto Respimat [Tiotropium Bromide-Olodaterol] Other (See Comments)    Severe headaches and rapid heartrate    Social History   Socioeconomic History  . Marital status: Single    Spouse name: Not on file  . Number of children: Not on file  . Years of education: Not on file  . Highest education level: Not on file  Occupational History  . Not on file  Tobacco Use  . Smoking status: Never Smoker  . Smokeless tobacco: Never Used  Substance and Sexual Activity  . Alcohol use: Yes    Alcohol/week: 2.0 standard drinks    Types: 2 Cans of beer per week  . Drug use: Never  . Sexual activity: Not on file  Other Topics Concern  . Not on file  Social History Narrative  . Not on file   Social Determinants of Health   Financial Resource Strain:   . Difficulty of Paying Living Expenses:   Food Insecurity:   . Worried About Programme researcher, broadcasting/film/video in the Last Year:   . Barista in the Last Year:   Transportation Needs:   . Freight forwarder (Medical):   [name] Lack of Transportation (Non-Medical):   Physical Activity:   . Days of Exercise per Week:   . Minutes of Exercise per Session:   Stress:   . Feeling of Stress :   Social Connections:   . Frequency of Communication with Friends and Family:   . Frequency of Social Gatherings with Friends and Family:   . Attends Religious Services:   . Active Member of Clubs or Organizations:   . Attends Banker Meetings:   [name] Marital Status:   Intimate Partner Violence:   . Fear of Current or Ex-Partner:   . Emotionally Abused:   [name] Physically Abused:   . Sexually Abused:     Review of Systems  Constitutional: Negative.  Negative for chills and fever.  HENT: Negative.  Negative for congestion and sore  throat.   Eyes: Negative.   Respiratory: Positive for shortness of breath (Dyspnea on exertion). Negative for cough.   Cardiovascular: Negative.  Negative for chest pain and palpitations.  Gastrointestinal: Negative.  Negative for abdominal pain, diarrhea, nausea and vomiting.  Genitourinary: Negative for dysuria and hematuria.  Skin: Negative.  Negative for rash.  Neurological: Negative for dizziness and headaches.  All other systems reviewed and are negative.   Objective  Alert and oriented x3 in no apparent respiratory distress. Vitals as reported by the patient: There were no vitals filed for this visit.  There are no diagnoses linked to this encounter. Chardai was seen today for leg swelling.  Diagnoses and all orders for this visit:  Essential hypertension -     amLODipine (NORVASC) 5 MG tablet; Take  1 tablet (5 mg total) by mouth daily.  Ankle edema, bilateral Comments: Amlodipine side effect  Pulmonary emphysema, unspecified emphysema type ([place])   Will schedule office visit within the next 2 weeks and address chronic depression then.  I discussed the assessment and treatment plan with the patient. The patient was provided an opportunity to ask questions and all were answered. The patient agreed with the plan and demonstrated an understanding of the instructions.   The patient was advised to call back or seek an in-person evaluation if the symptoms worsen or if the condition fails to improve as anticipated.  I provided 15 minutes of non-face-to-face time during this encounter.  Horald Pollen, MD  Primary Care at [facility]

## 2019-08-13 NOTE — Telephone Encounter (Signed)
Telephone visit.  

## 2019-08-17 NOTE — Telephone Encounter (Signed)
Called pt and made a 2 week f/u on 08/23/19 for a telemed visit

## 2019-08-23 ENCOUNTER — Ambulatory Visit (INDEPENDENT_AMBULATORY_CARE_PROVIDER_SITE_OTHER): Payer: Commercial Managed Care - PPO | Admitting: Emergency Medicine

## 2019-08-23 ENCOUNTER — Encounter: Payer: Self-pay | Admitting: Emergency Medicine

## 2019-08-23 ENCOUNTER — Other Ambulatory Visit: Payer: Self-pay

## 2019-08-23 ENCOUNTER — Telehealth: Payer: Self-pay | Admitting: Emergency Medicine

## 2019-08-23 VITALS — BP 158/106 | HR 75 | Temp 98.0°F | Ht 61.0 in | Wt 111.0 lb

## 2019-08-23 DIAGNOSIS — J439 Emphysema, unspecified: Secondary | ICD-10-CM

## 2019-08-23 DIAGNOSIS — I10 Essential (primary) hypertension: Secondary | ICD-10-CM

## 2019-08-23 DIAGNOSIS — E785 Hyperlipidemia, unspecified: Secondary | ICD-10-CM | POA: Diagnosis not present

## 2019-08-23 DIAGNOSIS — R600 Localized edema: Secondary | ICD-10-CM

## 2019-08-23 DIAGNOSIS — F321 Major depressive disorder, single episode, moderate: Secondary | ICD-10-CM

## 2019-08-23 MED ORDER — ESCITALOPRAM OXALATE 10 MG PO TABS: 10.0000 mg | ORAL_TABLET | Freq: Every day | ORAL | 1 refills | Status: DC

## 2019-08-23 NOTE — Progress Notes (Signed)
Kathleen Cameron 54 y.o.   Chief Complaint  Patient presents with  . Edema    edema in both feet for the past 2 week.    HISTORY OF PRESENT ILLNESS: This is a 54 y.o. female complaining of feet edema for the past 2 weeks. Has history of hypertension on amlodipine 5 mg and bisoprolol 10 mg daily. Has history of advanced COPD/emphysema. Has upcoming appointments with both pulmonary and cardiologist. Today mostly complaining of feeling depressed since her condition was diagnosed.  Inquires about medication.  HPI   Prior to Admission medications   Medication Sig Start Date End Date Taking? Authorizing Provider  amLODipine (NORVASC) 5 MG tablet Take 1 tablet (5 mg total) by mouth daily. 08/09/19 11/07/19 Yes SagardiaEilleen Kempf, MD  aspirin EC 81 MG EC tablet Take 1 tablet (81 mg total) by mouth daily. 04/17/19  Yes [name], MD  bisoprolol (ZEBETA) 10 MG tablet Take 1 tablet (10 mg total) by mouth daily. 07/24/19  Yes Hollice Espy, MD  Budeson-Glycopyrrol-Formoterol (BREZTRI AEROSPHERE) 160-9-4.8 MCG/ACT AERO Inhale 2 puffs into the lungs daily. 07/11/19  Yes Leslye Peer, MD  ipratropium-albuterol (DUONEB) 0.5-2.5 (3) MG/3ML SOLN Take 3 mLs by nebulization every 6 (six) hours as needed. Patient taking differently: Take 3 mLs by nebulization every 6 (six) hours as needed (shortness of breath).  05/03/19  Yes [name], NP  nitroGLYCERIN (NITROSTAT) 0.4 MG SL tablet Place 1 tablet (0.4 mg total) under the tongue every 5 (five) minutes as needed for chest pain. 04/17/19  Yes [name], MD  rosuvastatin (CRESTOR) 20 MG tablet Take 20 mg by mouth daily.   Yes [provider]    Allergies  Allergen Reactions  . Stiolto Respimat [Tiotropium Bromide-Olodaterol] Other (See Comments)    Severe headaches and rapid heartrate    Patient Active Problem List   Diagnosis Date Noted  . Pedal edema 08/04/2019  . Chronic respiratory failure with hypoxia (HCC)  03/07/2019  . CKD (chronic kidney disease) 01/05/2019  . COPD (chronic obstructive pulmonary disease) (HCC) 09/29/2018  . Pulmonary nodules 09/27/2018  . Dyslipidemia 07/07/2018  . Essential hypertension 03/10/2018    Past Medical History:  Diagnosis Date  . Asthma   . Hypertension     History reviewed. No pertinent surgical history.  Social History   Socioeconomic History  . Marital status: Single    Spouse name: Not on file  . Number of children: Not on file  . Years of education: Not on file  . Highest education level: Not on file  Occupational History  . Not on file  Tobacco Use  . Smoking status: Never Smoker  . Smokeless tobacco: Never Used  Substance and Sexual Activity  . Alcohol use: Yes    Alcohol/week: 2.0 standard drinks    Types: 2 Cans of beer per week  . Drug use: Never  . Sexual activity: Not on file  Other Topics Concern  . Not on file  Social History Narrative  . Not on file   Social Determinants of Health   Financial Resource Strain:   . Difficulty of Paying Living Expenses:   Food Insecurity:   . Worried About Programme researcher, broadcasting/film/video in the Last Year:   . Barista in the Last Year:   Transportation Needs:   . Freight forwarder (Medical):   [name] Lack of Transportation (Non-Medical):   Physical Activity:   . Days of Exercise per Week:   .  Minutes of Exercise per Session:   Stress:   . Feeling of Stress :   Social Connections:   . Frequency of Communication with Friends and Family:   . Frequency of Social Gatherings with Friends and Family:   . Attends Religious Services:   . Active Member of Clubs or Organizations:   . Attends Archivist Meetings:   [name] Marital Status:   Intimate Partner Violence:   . Fear of Current or Ex-Partner:   . Emotionally Abused:   [name] Physically Abused:   . Sexually Abused:     Family History  Family history unknown: Yes     Review of Systems  Constitutional: Negative.  Negative for  chills and fever.  HENT: Negative.  Negative for congestion and sore throat.   Respiratory: Positive for shortness of breath. Negative for cough, hemoptysis and sputum production.   Cardiovascular: Negative.  Negative for chest pain and palpitations.  Gastrointestinal: Negative.  Negative for abdominal pain, blood in stool, diarrhea, nausea and vomiting.  Genitourinary: Negative.  Negative for dysuria and hematuria.  Musculoskeletal: Negative for back pain and neck pain.  Skin: Negative.  Negative for rash.  Neurological: Negative for dizziness and headaches.  Psychiatric/Behavioral: Positive for depression. Negative for suicidal ideas.  All other systems reviewed and are negative.   Vitals:   08/23/19 1112  BP: (!) 158/106  Pulse: 75  Temp: 98 F (36.7 C)  SpO2: 98%    Physical Exam Vitals reviewed.  Constitutional:      Appearance: Normal appearance.  HENT:     Head: Normocephalic.  Eyes:     Extraocular Movements: Extraocular movements intact.     Pupils: Pupils are equal, round, and reactive to light.  Cardiovascular:     Rate and Rhythm: Normal rate and regular rhythm.     Heart sounds: Normal heart sounds.  Pulmonary:     Effort: Pulmonary effort is normal.     Breath sounds: No wheezing or rales.     Comments: Diminished breath sounds bilaterally Musculoskeletal:     Cervical back: Normal range of motion.     Comments: Feet: Mild edema.  No ankle edema.  Neurovascularly intact.  Skin:    General: Skin is warm and dry.  Neurological:     General: No focal deficit present.     Mental Status: She is alert and oriented to person, place, and time.  Psychiatric:     Comments: Seems depressed.    A total of 30 minutes was spent with the patient, greater than 50% of which was in counseling/coordination of care regarding hypertension and COPD, management and medications, differential diagnosis of leg edema, review of most recent office visit notes, review of most  recent blood work results, depression and its management, depression medication and side effects, prognosis and need for follow-up.   ASSESSMENT & PLAN: Samiya was seen today for edema.  Diagnoses and all orders for this visit:  Current moderate episode of major depressive disorder without prior episode ([place]) -     escitalopram (LEXAPRO) 10 MG tablet; Take 1 tablet (10 mg total) by mouth daily.  Pulmonary emphysema, unspecified emphysema type ([place])  Dyslipidemia  Essential hypertension  Edema of both feet    Patient Instructions       If you have lab work done today you will be contacted with your lab results within the next 2 weeks.  If you have not heard from [place] then please contact us. The fastest way to  get your results is to register for My Chart.   IF you received an x-ray today, you will receive an invoice from [facility]. Please contact [facility] at [phone] with questions or concerns regarding your invoice.   IF you received labwork today, you will receive an invoice from [place]. Please contact [facility] at [phone] with questions or concerns regarding your invoice.   Our billing staff will not be able to assist you with questions regarding bills from these companies.  You will be contacted with the lab results as soon as they are available. The fastest way to get your results is to activate your My Chart account. Instructions are located on the last page of this paperwork. If you have not heard from [place] regarding the results in 2 weeks, please contact this office.      Major Depressive Disorder, Adult Major depressive disorder (MDD) is a mental health condition. MDD often makes you feel sad, hopeless, or helpless. MDD can also cause symptoms in your body. MDD can affect your:  Work.  School.  Relationships.  Other normal activities. MDD can range from mild to very bad. It may occur once (single episode MDD). It can also  occur many times (recurrent MDD). The main symptoms of MDD often include:  Feeling sad, depressed, or irritable most of the time.  Loss of interest. MDD symptoms also include:  Sleeping too much or too little.  Eating too much or too little.  A change in your weight.  Feeling tired (fatigue) or having low energy.  Feeling worthless.  Feeling guilty.  Trouble making decisions.  Trouble thinking clearly.  Thoughts of suicide or harming others.  Feeling weak.  Feeling agitated.  Keeping yourself from being around other people (isolation). Follow these instructions at home: Activity  Do these things as told by your doctor: ? Go back to your normal activities. ? Exercise regularly. ? Spend time outdoors. Alcohol  Talk with your doctor about how alcohol can affect your antidepressant medicines.  Do not drink alcohol. Or, limit how much alcohol you drink. ? This means no more than 1 drink a day for nonpregnant women and 2 drinks a day for men. One drink equals one of these:  12 oz of beer.  5 oz of wine.  1 oz of hard liquor. General instructions  Take over-the-counter and prescription medicines only as told by your doctor.  Eat a healthy diet.  Get plenty of sleep.  Find activities that you enjoy. Make time to do them.  Think about joining a support group. Your doctor may be able to suggest a group for you.  Keep all follow-up visits as told by your doctor. This is important. Where to find more information:  The [employer] on Mental Illness: ? www.nami.org  [place] [employer] of Mental Health: ? http://www.maynard.net/  National Suicide Prevention Lifeline: ? 416-273-3561. This is free, 24-hour help. Contact a doctor if:  Your symptoms get worse.  You have new symptoms. Get help right away if:  You self-harm.  You see, hear, taste, smell, or feel things that are not present (hallucinate). If you ever feel like you may hurt yourself or  others, or have thoughts about taking your own life, get help right away. You can go to your nearest emergency department or call:  Your local emergency services (911 in the [place]  A suicide crisis helpline, such as the National Suicide Prevention Lifeline: ? (707) 066-5211. This is open 24 hours a day. This information  is not intended to replace advice given to you by your health care provider. Make sure you discuss any questions you have with your health care provider. Document Revised: 03/20/2017 Document Reviewed: 12/23/2015 Elsevier Patient Education  2020 Elsevier Inc.      Edwina Barth, MD [facility]

## 2019-08-23 NOTE — Patient Instructions (Addendum)
   If you have lab work done today you will be contacted with your lab results within the next 2 weeks.  If you have not heard from us then please contact us. The fastest way to get your results is to register for My Chart.   IF you received an x-ray today, you will receive an invoice from [facility]. Please contact [facility] at [phone] with questions or concerns regarding your invoice.   IF you received labwork today, you will receive an invoice from [facility]. Please contact [facility] at [phone] with questions or concerns regarding your invoice.   Our billing staff will not be able to assist you with questions regarding bills from these companies.  You will be contacted with the lab results as soon as they are available. The fastest way to get your results is to activate your My Chart account. Instructions are located on the last page of this paperwork. If you have not heard from us regarding the results in 2 weeks, please contact this office.     Major Depressive Disorder, Adult Major depressive disorder (MDD) is a mental health condition. MDD often makes you feel sad, hopeless, or helpless. MDD can also cause symptoms in your body. MDD can affect your:  Work.  School.  Relationships.  Other normal activities. MDD can range from mild to very bad. It may occur once (single episode MDD). It can also occur many times (recurrent MDD). The main symptoms of MDD often include:  Feeling sad, depressed, or irritable most of the time.  Loss of interest. MDD symptoms also include:  Sleeping too much or too little.  Eating too much or too little.  A change in your weight.  Feeling tired (fatigue) or having low energy.  Feeling worthless.  Feeling guilty.  Trouble making decisions.  Trouble thinking clearly.  Thoughts of suicide or harming others.  Feeling weak.  Feeling agitated.  Keeping yourself from being around other people  (isolation). Follow these instructions at home: Activity  Do these things as told by your doctor: ? Go back to your normal activities. ? Exercise regularly. ? Spend time outdoors. Alcohol  Talk with your doctor about how alcohol can affect your antidepressant medicines.  Do not drink alcohol. Or, limit how much alcohol you drink. ? This means no more than 1 drink a day for nonpregnant women and 2 drinks a day for men. One drink equals one of these:  12 oz of beer.  5 oz of wine.  1 oz of hard liquor. General instructions  Take over-the-counter and prescription medicines only as told by your doctor.  Eat a healthy diet.  Get plenty of sleep.  Find activities that you enjoy. Make time to do them.  Think about joining a support group. Your doctor may be able to suggest a group for you.  Keep all follow-up visits as told by your doctor. This is important. Where to find more information:  National Alliance on Mental Illness: ? www.nami.org  [place] National Institute of Mental Health: ? www.nimh.nih.gov  National Suicide Prevention Lifeline: ? 1-800-273-8255. This is free, 24-hour help. Contact a doctor if:  Your symptoms get worse.  You have new symptoms. Get help right away if:  You self-harm.  You see, hear, taste, smell, or feel things that are not present (hallucinate). If you ever feel like you may hurt yourself or others, or have thoughts about taking your own life, get help right away. You can go to your   nearest emergency department or call:  Your local emergency services (911 in the [place]  A suicide crisis helpline, such as the National Suicide Prevention Lifeline: ? 1-800-273-8255. This is open 24 hours a day. This information is not intended to replace advice given to you by your health care provider. Make sure you discuss any questions you have with your health care provider. Document Revised: 03/20/2017 Document Reviewed: 12/23/2015 Elsevier Patient  Education  2020 Elsevier Inc.  

## 2019-09-06 ENCOUNTER — Other Ambulatory Visit: Payer: Self-pay

## 2019-09-06 ENCOUNTER — Ambulatory Visit (INDEPENDENT_AMBULATORY_CARE_PROVIDER_SITE_OTHER): Payer: Commercial Managed Care - PPO | Admitting: Cardiology

## 2019-09-06 VITALS — BP 174/94 | HR 62 | Ht 61.0 in | Wt 112.0 lb

## 2019-09-06 DIAGNOSIS — R002 Palpitations: Secondary | ICD-10-CM | POA: Diagnosis not present

## 2019-09-06 DIAGNOSIS — R072 Precordial pain: Secondary | ICD-10-CM | POA: Diagnosis not present

## 2019-09-06 DIAGNOSIS — R600 Localized edema: Secondary | ICD-10-CM

## 2019-09-06 DIAGNOSIS — I10 Essential (primary) hypertension: Secondary | ICD-10-CM

## 2019-09-06 MED ORDER — LISINOPRIL 10 MG PO TABS: 10.0000 mg | ORAL_TABLET | Freq: Every day | ORAL | 3 refills | Status: DC

## 2019-09-06 NOTE — Progress Notes (Signed)
Cardiology Office Note:    Date:  09/06/2019   ID:  Kathleen Cameron, DOB 1965-12-29, MRN [id number]  PCP:  Horald Pollen, MD  Cardiologist:  Donato Heinz, MD  Electrophysiologist:  None   Referring MD: Horald Pollen, *   Chief Complaint  Patient presents with  . Chest Pain    History of Present Illness:    Kathleen Cameron is a 54 y.o. female with a hx of hypertension, severe obstructive pulmonary disease on home 2L O2 who presents for follow-up.  She was referred by [name], NP for evaluation of palpitations, with initial visit on 03/14/2019.  She was seen in the ED on 09/23/2018 with shortness of breath.  CTA was negative for PE but did show emphysema.  She was referred to pulmonology.  She reported that she has been experiencing palpitations and dyspnea with minimal exertion, so was referred to cardiology for evaluation.  Underwent an echocardiogram on 12/20/2018, which showed normal systolic function (EF greater than 97%, grade 1 diastolic dysfunction, mild LVH, normal RV function, no significant valvular disease, PASP 42, IVC small/colllapsible.  Cardiac monitor on 04/19/2019 showed no significant arrhythmias.  She was admitted to [facility] from 12/23 through 04/17/2019 after presenting with chest pain and shortness of breath.  She was treated for COPD exacerbation.  Troponin level increased to 372.  Cardiac catheterization was attempted but she was unable to lie flat.  She completed 3 days on heparin drip.  Outpatient coronary CTA was ordered.  Coronary CTA on 06/08/2019 showed calcium score 3 (77th percentile), nonobstructive CAD with calcified plaque in D1 causing 0-24% stenosis.    Since last clinic visit, was hospitalized last month with COPD exacerbation.  Improved with steroids.  Currently reports having baseline dyspnea.  Denies any chest pain.  Denies any lightheadedness or syncope.  Does report she has had lower extremity edema, describes as  swelling in her feet and ankles over the last several weeks.   Past Medical History:  Diagnosis Date  . Asthma   . Hypertension     No past surgical history on file.  Current Medications: Current Meds  Medication Sig  . amLODipine (NORVASC) 5 MG tablet Take 1 tablet (5 mg total) by mouth daily.  [name] aspirin EC 81 MG EC tablet Take 1 tablet (81 mg total) by mouth daily.  . bisoprolol (ZEBETA) 10 MG tablet Take 1 tablet (10 mg total) by mouth daily.  . Budeson-Glycopyrrol-Formoterol (BREZTRI AEROSPHERE) 160-9-4.8 MCG/ACT AERO Inhale 2 puffs into the lungs daily.  [name] escitalopram (LEXAPRO) 10 MG tablet Take 1 tablet (10 mg total) by mouth daily.  [name] ipratropium-albuterol (DUONEB) 0.5-2.5 (3) MG/3ML SOLN Take 3 mLs by nebulization every 6 (six) hours as needed. (Patient taking differently: Take 3 mLs by nebulization every 6 (six) hours as needed (shortness of breath). )  . nitroGLYCERIN (NITROSTAT) 0.4 MG SL tablet Place 1 tablet (0.4 mg total) under the tongue every 5 (five) minutes as needed for chest pain.  . rosuvastatin (CRESTOR) 20 MG tablet Take 20 mg by mouth daily.     Allergies:   Stiolto respimat [tiotropium bromide-olodaterol]   Social History   Socioeconomic History  . Marital status: Single    Spouse name: Not on file  . Number of children: Not on file  . Years of education: Not on file  . Highest education level: Not on file  Occupational History  . Not on file  Tobacco Use  . Smoking status: Never Smoker  .  Smokeless tobacco: Never Used  Substance and Sexual Activity  . Alcohol use: Yes    Alcohol/week: 2.0 standard drinks    Types: 2 Cans of beer per week  . Drug use: Never  . Sexual activity: Not on file  Other Topics Concern  . Not on file  Social History Narrative  . Not on file   Social Determinants of Health   Financial Resource Strain:   . Difficulty of Paying Living Expenses:   Food Insecurity:   . Worried About Programme researcher, broadcasting/film/videounning Out of Food in the Last  Year:   . [name] Out of Food in the Last Year:   Transportation Needs:   . Freight forwarderLack of Transportation (Medical):   [name]. Lack of Transportation (Non-Medical):   Physical Activity:   . Days of Exercise per Week:   . Minutes of Exercise per Session:   Stress:   . Feeling of Stress :   Social Connections:   . Frequency of Communication with Friends and Family:   . Frequency of Social Gatherings with Friends and Family:   . Attends Religious Services:   . Active Member of Clubs or Organizations:   . Attends BankerClub or Organization Meetings:   [name]. Marital Status:      Family History: The patient's Family history is unknown by patient.  ROS:   Please see the history of present illness.    All other systems reviewed and are negative.  EKGs/Labs/Other Studies Reviewed:    The following studies were reviewed today:   EKG:  EKG is not ordered today.  The ekg ordered most recetnly demonstrates normal sinus rhythm, rate 92, poor R wave progression, T wave flattening inlimb leads  TTE 12/20/18:  1. The left ventricle has hyperdynamic systolic function, with an ejection fraction of >65%. The cavity size was normal. There is mildly increased left ventricular wall thickness. Left ventricular diastolic Doppler parameters are consistent with  impaired relaxation. No evidence of left ventricular regional wall motion abnormalities.  2. The aortic root is normal in size and structure.  3. The aortic valve is tricuspid. No stenosis of the aortic valve.  4. Trivial pericardial effusion is present.  5. The right ventricle has normal systolic function. The cavity was normal. There is no increase in right ventricular wall thickness.  6. Normal IVC size with PA systolic pressure 42 mmHg.  7. No evidence of mitral valve stenosis. No significant mitral regurgitation.  Cardiac monitor 04/19/19:  No significant arrhythmias detected 3 days of data recorded on Zio monitor. Patient had a min HR of 60 bpm, max HR of 129  bpm, and avg HR of 79 bpm. Predominant underlying rhythm was Sinus Rhythm. No VT, SVT, atrial fibrillation, high degree block, or pauses noted. Isolated atrial and ventricular ectopy was rare (<1%). There were 0 triggered events. No significant arrhythmias detected.  TTE 04/14/19: 1. Left ventricular ejection fraction, by visual estimation, is 60 to  65%. The left ventricle has normal function. There is no left ventricular  hypertrophy.  2. Left ventricular diastolic parameters are consistent with Grade I  diastolic dysfunction (impaired relaxation).  3. The left ventricle has no regional wall motion abnormalities.  4. Global right ventricle has normal systolic function.The right  ventricular size is normal. No increase in right ventricular wall  thickness.  5. Left atrial size was normal.  6. Right atrial size was normal.  7. The mitral valve is normal in structure. No evidence of mitral valve  regurgitation. No evidence of mitral  stenosis.  8. The tricuspid valve is normal in structure.  9. The aortic valve is normal in structure. Aortic valve regurgitation is  not visualized. No evidence of aortic valve sclerosis or stenosis.  10. The pulmonic valve was normal in structure. Pulmonic valve  regurgitation is not visualized.  11. TR signal is inadequate for assessing pulmonary artery systolic  pressure.  12. The inferior vena cava is normal in size with greater than 50%  respiratory variability, suggesting right atrial pressure of 3 mmHg.   Recent Labs: 04/13/2019: Magnesium 1.9 07/23/2019: ALT 10; B Natriuretic Peptide 16.9; BUN 18; Creatinine, Ser 1.12; Hemoglobin 12.9; Platelets 281; Potassium 3.3; Sodium 142  Recent Lipid Panel    Component Value Date/Time   CHOL 193 04/13/2019 0640   CHOL 300 (H) 03/10/2018 1033   TRIG 85 04/13/2019 0640   HDL 68 04/13/2019 0640   HDL 72 03/10/2018 1033   CHOLHDL 2.8 04/13/2019 0640   VLDL 17 04/13/2019 0640   LDLCALC 108 (H)  04/13/2019 0640   LDLCALC 206 (H) 03/10/2018 1033    Physical Exam:    VS:  BP (!) 174/94   Pulse 62   Ht 5\' 1"  (1.549 m)   Wt 112 lb (50.8 kg)   SpO2 99%   BMI 21.16 kg/m     Wt Readings from Last 3 Encounters:  09/06/19 112 lb (50.8 kg)  08/23/19 111 lb (50.3 kg)  08/04/19 111 lb 12.8 oz (50.7 kg)     GEN:  in no acute distress, on O2 HEENT: Normal NECK: No JVD LYMPHATICS: No lymphadenopathy CARDIAC: RRR, no murmurs, rubs, gallops RESPIRATORY: Diminished breath sounds ABDOMEN: Soft, non-tender, non-distended MUSCULOSKELETAL: Mild non-pitting edema in ankles/top of feet SKIN: Warm and dry NEUROLOGIC:  Alert and oriented x 3 PSYCHIATRIC:  Normal affect   ASSESSMENT:    1. Essential hypertension   2. Bilateral leg edema   3. Precordial pain   4. Palpitations    PLAN:    Chest pain: Recent admission with chest pain and mild troponin elevation (high-sensitivity troponin 372), but occurred in setting of COPD exacerbation.  She was unable to lie flat for cardiac catheterization.  Coronary CTA on 06/08/2019 showed calcium score 3 (77th percentile), nonobstructive CAD with calcified plaque in D1 causing 0-24% stenosis. -Continued crestor 20 mg daily  Palpitations: No significant arrhythmia on Zio patch  Hypertension: On bisoprolol 10 mg daily and amlodipine 5 mg daily.  Previously was on lisinopril, but was discontinued at some point.  BP significantly elevated in clinic today, will restart lisinopril 10 mg daily.  Asked patient to check BP daily for next 2 weeks and call with results  Lower extremity edema: mild non-pitting edema in ankles/top of feet.  Will check BNP, CMP.  Suspect secondary to amlodipine use.  Not causing patient any symptoms.  Recommend leg elevation and use of compression stockings while awake  Hyperlipidemia: On rosuvastatin 20 mg daily.  LDL 108 on 04/13/2019, significantly improved from 206 in 2019  RTC in 3 months   Medication Adjustments/Labs  and Tests Ordered: Current medicines are reviewed at length with the patient today.  Concerns regarding medicines are outlined above.  Orders Placed This Encounter  Procedures  . Comprehensive metabolic panel  . Brain natriuretic peptide   Meds ordered this encounter  Medications  . lisinopril (ZESTRIL) 10 MG tablet    Sig: Take 1 tablet (10 mg total) by mouth daily.    Dispense:  90 tablet    Refill:  3    Patient Instructions  Medication Instructions:  START lisinopril 10 mg daily  *If you need a refill on your cardiac medications before your next appointment, please call your pharmacy*   Lab Work: CMET, BNP today  If you have labs (blood work) drawn today and your tests are completely normal, you will receive your results only by: [name] MyChart Message (if you have MyChart) OR . A paper copy in the mail If you have any lab test that is abnormal or we need to change your treatment, we will call you to review the results.  Follow-Up: At [facility], you and your health needs are our priority.  As part of our continuing mission to provide you with exceptional heart care, we have created designated Provider Care Teams.  These Care Teams include your primary Cardiologist (physician) and Advanced Practice Providers (APPs -  Physician Assistants and Nurse Practitioners) who all work together to provide you with the care you need, when you need it.  We recommend signing up for the patient portal called "MyChart".  Sign up information is provided on this After Visit Summary.  MyChart is used to connect with patients for Virtual Visits (Telemedicine).  Patients are able to view lab/test results, encounter notes, upcoming appointments, etc.  Non-urgent messages can be sent to your provider as well.   To learn more about what you can do with MyChart, go to [email or web address].    Your next appointment:   3 month(s)  The format for your next appointment:   In Person  Provider:     Epifanio Lesches, MD   Other Instructions Please check your blood pressure at home daily, write it down.  Call the office of send message via Mychart with the readings in 2 weeks for Dr. Bjorn Pippin to review.     How to Use Compression Stockings Compression stockings are elastic socks that squeeze the legs. They help increase blood flow (circulation) to the legs, decrease swelling in the legs, and reduce the chance of developing blood clots in the lower legs. Compression stockings are often used by people who:  Are recovering from surgery.  Have poor circulation in their legs.  Tend to get blood clots in their legs.  Have bulging (varicose) veins.  Sit or stay in bed for long periods of time. Follow instructions from your health care provider about how and when to wear your compression stockings. How to wear compression stockings Before you put on your compression stockings:  Make sure that they are the correct size and degree of compression. If you do not know your size or required grade of compression, ask your health care provider and follow the manufacturer's instructions that come with the stockings.  Make sure that they are clean, dry, and in good condition.  Check them for rips and tears. Do not put them on if they are ripped or torn. Put your stockings on first thing in the morning, before you get out of bed. Keep them on for as long as your health care provider advises. When you are wearing your stockings:  Keep them as smooth as possible. Do not allow them to bunch up. It is especially important to prevent the stockings from bunching up around your toes or behind your knees.  Do not roll the stockings downward and leave them rolled down. This can decrease blood flow to your leg.  Change them right away if they become wet or dirty. When you take off your stockings, inspect  your legs and feet. Check for:  Open sores.  Red spots.  Swelling. General tips  Do not  stop wearing compression stockings without talking to your health care provider first.  Wash your stockings every day with mild detergent in cold or warm water. Do not use bleach. Air-dry your stockings or dry them in a clothes dryer on low heat. It may be helpful to have two pairs so that you have a pair to wear while the other is being washed.  Replace your stockings every 3-6 months.  If skin moisturizing is part of your treatment plan, apply lotion or cream at night so that your skin will be dry when you put on the stockings in the morning. It is harder to put the stockings on when you have lotion on your legs or feet.  Wear nonskid shoes or slip-resistant socks when walking while wearing compression stockings. Contact a health care provider and remove your stockings if you have:  A feeling of pins and needles in your feet or legs.  Open sores, red spots, or other skin changes on your feet or legs.  Swelling or pain that gets worse. Get help right away if you have:  Numbness or tingling in your lower legs that does not get better right after you take the stockings off.  Toes or feet that are unusually cold or turn a bluish color.  A warm or red area on your leg.  New swelling or soreness in your leg.  Shortness of breath.  Chest pain.  A fast or irregular heartbeat.  Light-headedness.  Dizziness. Summary  Compression stockings are elastic socks that squeeze the legs.  They help increase blood flow (circulation) to the legs, decrease swelling in the legs, and reduce the chance of developing blood clots in the lower legs.  Follow instructions from your health care provider about how and when to wear your compression stockings.  Do not stop wearing your compression stockings without talking to your health care provider first. This information is not intended to replace advice given to you by your health care provider. Make sure you discuss any questions you have with your  health care provider. Document Revised: 04/09/2017 Document Reviewed: 04/09/2017 Elsevier Patient Education  2020 [employer].      Signed, [name], MD  09/06/2019 5:57 PM    [facility]

## 2019-09-06 NOTE — Patient Instructions (Signed)
Medication Instructions:  START lisinopril 10 mg daily  *If you need a refill on your cardiac medications before your next appointment, please call your pharmacy*   Lab Work: CMET, BNP today  If you have labs (blood work) drawn today and your tests are completely normal, you will receive your results only by:  Alcan Border (if you have MyChart) OR  A paper copy in the mail If you have any lab test that is abnormal or we need to change your treatment, we will call you to review the results.  Follow-Up: At [facility], you and your health needs are our priority.  As part of our continuing mission to provide you with exceptional heart care, we have created designated Provider Care Teams.  These Care Teams include your primary Cardiologist (physician) and Advanced Practice Providers (APPs -  Physician Assistants and Nurse Practitioners) who all work together to provide you with the care you need, when you need it.  We recommend signing up for the patient portal called "MyChart".  Sign up information is provided on this After Visit Summary.  MyChart is used to connect with patients for Virtual Visits (Telemedicine).  Patients are able to view lab/test results, encounter notes, upcoming appointments, etc.  Non-urgent messages can be sent to your provider as well.   To learn more about what you can do with MyChart, go to [email or web address].    Your next appointment:   3 month(s)  The format for your next appointment:   In Person  Provider:   Oswaldo Milian, MD   Other Instructions Please check your blood pressure at home daily, write it down.  Call the office of send message via Mychart with the readings in 2 weeks for Dr. Gardiner Rhyme to review.     How to Use Compression Stockings Compression stockings are elastic socks that squeeze the legs. They help increase blood flow (circulation) to the legs, decrease swelling in the legs, and reduce the chance of developing blood  clots in the lower legs. Compression stockings are often used by people who:  Are recovering from surgery.  Have poor circulation in their legs.  Tend to get blood clots in their legs.  Have bulging (varicose) veins.  Sit or stay in bed for long periods of time. Follow instructions from your health care provider about how and when to wear your compression stockings. How to wear compression stockings Before you put on your compression stockings:  Make sure that they are the correct size and degree of compression. If you do not know your size or required grade of compression, ask your health care provider and follow the manufacturer's instructions that come with the stockings.  Make sure that they are clean, dry, and in good condition.  Check them for rips and tears. Do not put them on if they are ripped or torn. Put your stockings on first thing in the morning, before you get out of bed. Keep them on for as long as your health care provider advises. When you are wearing your stockings:  Keep them as smooth as possible. Do not allow them to bunch up. It is especially important to prevent the stockings from bunching up around your toes or behind your knees.  Do not roll the stockings downward and leave them rolled down. This can decrease blood flow to your leg.  Change them right away if they become wet or dirty. When you take off your stockings, inspect your legs and feet. Check for:  Open  sores.  Red spots.  Swelling. General tips  Do not stop wearing compression stockings without talking to your health care provider first.  Wash your stockings every day with mild detergent in cold or warm water. Do not use bleach. Air-dry your stockings or dry them in a clothes dryer on low heat. It may be helpful to have two pairs so that you have a pair to wear while the other is being washed.  Replace your stockings every 3-6 months.  If skin moisturizing is part of your treatment plan,  apply lotion or cream at night so that your skin will be dry when you put on the stockings in the morning. It is harder to put the stockings on when you have lotion on your legs or feet.  Wear nonskid shoes or slip-resistant socks when walking while wearing compression stockings. Contact a health care provider and remove your stockings if you have:  A feeling of pins and needles in your feet or legs.  Open sores, red spots, or other skin changes on your feet or legs.  Swelling or pain that gets worse. Get help right away if you have:  Numbness or tingling in your lower legs that does not get better right after you take the stockings off.  Toes or feet that are unusually cold or turn a bluish color.  A warm or red area on your leg.  New swelling or soreness in your leg.  Shortness of breath.  Chest pain.  A fast or irregular heartbeat.  Light-headedness.  Dizziness. Summary  Compression stockings are elastic socks that squeeze the legs.  They help increase blood flow (circulation) to the legs, decrease swelling in the legs, and reduce the chance of developing blood clots in the lower legs.  Follow instructions from your health care provider about how and when to wear your compression stockings.  Do not stop wearing your compression stockings without talking to your health care provider first. This information is not intended to replace advice given to you by your health care provider. Make sure you discuss any questions you have with your health care provider. Document Revised: 04/09/2017 Document Reviewed: 04/09/2017 Elsevier Patient Education  2020 [employer].

## 2019-09-07 LAB — COMPREHENSIVE METABOLIC PANEL
ALT: 9 IU/L (ref 0–32)
AST: 17 IU/L (ref 0–40)
Albumin/Globulin Ratio: 1.5 (ref 1.2–2.2)
Albumin: 4.4 g/dL (ref 3.8–4.9)
Alkaline Phosphatase: 70 IU/L (ref 48–121)
BUN/Creatinine Ratio: 13 (ref 9–23)
BUN: 11 mg/dL (ref 6–24)
Bilirubin Total: 0.2 mg/dL (ref 0.0–1.2)
CO2: 33 mmol/L — ABNORMAL HIGH (ref 20–29)
Calcium: 9.5 mg/dL (ref 8.7–10.2)
Chloride: 99 mmol/L (ref 96–106)
Creatinine, Ser: 0.87 mg/dL (ref 0.57–1.00)
GFR calc Af Amer: 86 mL/min/{1.73_m2} (ref >59)
GFR calc non Af Amer: 75 mL/min/{1.73_m2} (ref >59)
Globulin, Total: 2.9 g/dL (ref 1.5–4.5)
Glucose: 87 mg/dL (ref 65–99)
Potassium: 4.3 mmol/L (ref 3.5–5.2)
Sodium: 143 mmol/L (ref 134–144)
Total Protein: 7.3 g/dL (ref 6.0–8.5)

## 2019-09-07 LAB — BRAIN NATRIURETIC PEPTIDE: BNP: 32.2 pg/mL (ref 0.0–100.0)

## 2019-09-20 ENCOUNTER — Other Ambulatory Visit: Payer: Self-pay

## 2019-09-20 ENCOUNTER — Ambulatory Visit (INDEPENDENT_AMBULATORY_CARE_PROVIDER_SITE_OTHER): Payer: Commercial Managed Care - PPO | Admitting: Emergency Medicine

## 2019-09-20 ENCOUNTER — Encounter: Payer: Self-pay | Admitting: Emergency Medicine

## 2019-09-20 VITALS — BP 160/93 | HR 59 | Temp 97.9°F | Ht 61.0 in | Wt 114.0 lb

## 2019-09-20 DIAGNOSIS — F321 Major depressive disorder, single episode, moderate: Secondary | ICD-10-CM | POA: Diagnosis not present

## 2019-09-20 DIAGNOSIS — N1832 Chronic kidney disease, stage 3b: Secondary | ICD-10-CM

## 2019-09-20 DIAGNOSIS — J439 Emphysema, unspecified: Secondary | ICD-10-CM | POA: Diagnosis not present

## 2019-09-20 DIAGNOSIS — I10 Essential (primary) hypertension: Secondary | ICD-10-CM | POA: Diagnosis not present

## 2019-09-20 MED ORDER — ESCITALOPRAM OXALATE 20 MG PO TABS: 20.0000 mg | ORAL_TABLET | Freq: Every day | ORAL | 1 refills | Status: DC

## 2019-09-20 NOTE — Patient Instructions (Addendum)
Major Depressive Disorder, Adult Major depressive disorder (MDD) is a mental health condition. MDD often makes you feel sad, hopeless, or helpless. MDD can also cause symptoms in your body. MDD can affect your:  Work.  School.  Relationships.  Other normal activities. MDD can range from mild to very bad. It may occur once (single episode MDD). It can also occur many times (recurrent MDD). The main symptoms of MDD often include:  Feeling sad, depressed, or irritable most of the time.  Loss of interest. MDD symptoms also include:  Sleeping too much or too little.  Eating too much or too little.  A change in your weight.  Feeling tired (fatigue) or having low energy.  Feeling worthless.  Feeling guilty.  Trouble making decisions.  Trouble thinking clearly.  Thoughts of suicide or harming others.  Feeling weak.  Feeling agitated.  Keeping yourself from being around other people (isolation). Follow these instructions at home: Activity  Do these things as told by your doctor: ? Go back to your normal activities. ? Exercise regularly. ? Spend time outdoors. Alcohol  Talk with your doctor about how alcohol can affect your antidepressant medicines.  Do not drink alcohol. Or, limit how much alcohol you drink. ? This means no more than 1 drink a day for nonpregnant women and 2 drinks a day for men. One drink equals one of these:  12 oz of beer.  5 oz of wine.  1 oz of hard liquor. General instructions  Take over-the-counter and prescription medicines only as told by your doctor.  Eat a healthy diet.  Get plenty of sleep.  Find activities that you enjoy. Make time to do them.  Think about joining a support group. Your doctor may be able to suggest a group for you.  Keep all follow-up visits as told by your doctor. This is important. Where to find more information:  National Alliance on Mental Illness: ? www.nami.org  [place] National Institute of Mental  Health: ? www.nimh.nih.gov  National Suicide Prevention Lifeline: ? 1-800-273-8255. This is free, 24-hour help. Contact a doctor if:  Your symptoms get worse.  You have new symptoms. Get help right away if:  You self-harm.  You see, hear, taste, smell, or feel things that are not present (hallucinate). If you ever feel like you may hurt yourself or others, or have thoughts about taking your own life, get help right away. You can go to your nearest emergency department or call:  Your local emergency services (911 in the [place]  A suicide crisis helpline, such as the National Suicide Prevention Lifeline: ? 1-800-273-8255. This is open 24 hours a day. This information is not intended to replace advice given to you by your health care provider. Make sure you discuss any questions you have with your health care provider. Document Revised: 03/20/2017 Document Reviewed: 12/23/2015 Elsevier Patient Education  2020 [employer] Hypertension, Adult High blood pressure (hypertension) is when the force of blood pumping through the arteries is too strong. The arteries are the blood vessels that carry blood from the heart throughout the body. Hypertension forces the heart to work harder to pump blood and may cause arteries to become narrow or stiff. Untreated or uncontrolled hypertension can cause a heart attack, heart failure, a stroke, kidney disease, and other problems. A blood pressure reading consists of a higher number over a lower number. Ideally, your blood pressure should be below 120/80. The first ("top") number is called the systolic pressure. It is a measure   of the pressure in your arteries as your heart beats. The second ("bottom") number is called the diastolic pressure. It is a measure of the pressure in your arteries as the heart relaxes. What are the causes? The exact cause of this condition is not known. There are some conditions that result in or are related to high blood  pressure. What increases the risk? Some risk factors for high blood pressure are under your control. The following factors may make you more likely to develop this condition:  Smoking.  Having type 2 diabetes mellitus, high cholesterol, or both.  Not getting enough exercise or physical activity.  Being overweight.  Having too much fat, sugar, calories, or salt (sodium) in your diet.  Drinking too much alcohol. Some risk factors for high blood pressure may be difficult or impossible to change. Some of these factors include:  Having chronic kidney disease.  Having a family history of high blood pressure.  Age. Risk increases with age.  Race. You may be at higher risk if you are African American.  Gender. Men are at higher risk than women before age 45. After age 65, women are at higher risk than men.  Having obstructive sleep apnea.  Stress. What are the signs or symptoms? High blood pressure may not cause symptoms. Very high blood pressure (hypertensive crisis) may cause:  Headache.  Anxiety.  Shortness of breath.  Nosebleed.  Nausea and vomiting.  Vision changes.  Severe chest pain.  Seizures. How is this diagnosed? This condition is diagnosed by measuring your blood pressure while you are seated, with your arm resting on a flat surface, your legs uncrossed, and your feet flat on the floor. The cuff of the blood pressure monitor will be placed directly against the skin of your upper arm at the level of your heart. It should be measured at least twice using the same arm. Certain conditions can cause a difference in blood pressure between your right and left arms. Certain factors can cause blood pressure readings to be lower or higher than normal for a short period of time:  When your blood pressure is higher when you are in a health care provider's office than when you are at home, this is called white coat hypertension. Most people with this condition do not need  medicines.  When your blood pressure is higher at home than when you are in a health care provider's office, this is called masked hypertension. Most people with this condition may need medicines to control blood pressure. If you have a high blood pressure reading during one visit or you have normal blood pressure with other risk factors, you may be asked to:  Return on a different day to have your blood pressure checked again.  Monitor your blood pressure at home for 1 week or longer. If you are diagnosed with hypertension, you may have other blood or imaging tests to help your health care provider understand your overall risk for other conditions. How is this treated? This condition is treated by making healthy lifestyle changes, such as eating healthy foods, exercising more, and reducing your alcohol intake. Your health care provider may prescribe medicine if lifestyle changes are not enough to get your blood pressure under control, and if:  Your systolic blood pressure is above 130.  Your diastolic blood pressure is above 80. Your personal target blood pressure may vary depending on your medical conditions, your age, and other factors. Follow these instructions at home: Eating and drinking     Eat a diet that is high in fiber and potassium, and low in sodium, added sugar, and fat. An example eating plan is called the DASH (Dietary Approaches to Stop Hypertension) diet. To eat this way: ? Eat plenty of fresh fruits and vegetables. Try to fill one half of your plate at each meal with fruits and vegetables. ? Eat whole grains, such as whole-wheat pasta, brown rice, or whole-grain bread. Fill about one fourth of your plate with whole grains. ? Eat or drink low-fat dairy products, such as skim milk or low-fat yogurt. ? Avoid fatty cuts of meat, processed or cured meats, and poultry with skin. Fill about one fourth of your plate with lean proteins, such as fish, chicken without skin, beans, eggs,  or tofu. ? Avoid pre-made and processed foods. These tend to be higher in sodium, added sugar, and fat.  Reduce your daily sodium intake. Most people with hypertension should eat less than 1,500 mg of sodium a day.  Do not drink alcohol if: ? Your health care provider tells you not to drink. ? You are pregnant, may be pregnant, or are planning to become pregnant.  If you drink alcohol: ? Limit how much you use to:  0-1 drink a day for women.  0-2 drinks a day for men. ? Be aware of how much alcohol is in your drink. In the [place] one drink equals one 12 oz bottle of beer (355 mL), one 5 oz glass of wine (148 mL), or one 1 oz glass of hard liquor (44 mL). Lifestyle   Work with your health care provider to maintain a healthy body weight or to lose weight. Ask what an ideal weight is for you.  Get at least 30 minutes of exercise most days of the week. Activities may include walking, swimming, or biking.  Include exercise to strengthen your muscles (resistance exercise), such as Pilates or lifting weights, as part of your weekly exercise routine. Try to do these types of exercises for 30 minutes at least 3 days a week.  Do not use any products that contain nicotine or tobacco, such as cigarettes, e-cigarettes, and chewing tobacco. If you need help quitting, ask your health care provider.  Monitor your blood pressure at home as told by your health care provider.  Keep all follow-up visits as told by your health care provider. This is important. Medicines  Take over-the-counter and prescription medicines only as told by your health care provider. Follow directions carefully. Blood pressure medicines must be taken as prescribed.  Do not skip doses of blood pressure medicine. Doing this puts you at risk for problems and can make the medicine less effective.  Ask your health care provider about side effects or reactions to medicines that you should watch for. Contact a health care  provider if you:  Think you are having a reaction to a medicine you are taking.  Have headaches that keep coming back (recurring).  Feel dizzy.  Have swelling in your ankles.  Have trouble with your vision. Get help right away if you:  Develop a severe headache or confusion.  Have unusual weakness or numbness.  Feel faint.  Have severe pain in your chest or abdomen.  Vomit repeatedly.  Have trouble breathing. Summary  Hypertension is when the force of blood pumping through your arteries is too strong. If this condition is not controlled, it may put you at risk for serious complications.  Your personal target blood pressure may vary depending on your   medical conditions, your age, and other factors. For most people, a normal blood pressure is less than 120/80.  Hypertension is treated with lifestyle changes, medicines, or a combination of both. Lifestyle changes include losing weight, eating a healthy, low-sodium diet, exercising more, and limiting alcohol. This information is not intended to replace advice given to you by your health care provider. Make sure you discuss any questions you have with your health care provider. Document Revised: 12/16/2017 Document Reviewed: 12/16/2017 Elsevier Patient Education  2020 [employer]  

## 2019-09-20 NOTE — Progress Notes (Signed)
Kathleen Cameron 54 y.o.   Chief Complaint  Patient presents with  . Hypertension    foot swelling x 2 weeks     HISTORY OF PRESENT ILLNESS: This is a 54 y.o. female here for follow-up of depression.  On Lexapro 10 mg daily started 1 month ago.  Feeling better. Has history of hypertension on amlodipine 5 mg daily, hydrochlorothiazide 25 mg daily, bisoprolol 10 mg daily and lisinopril 10 mg daily. Also taking rosuvastatin 20 mg daily and 1 baby aspirin a day. History of advanced COPD, oxygen dependent, on Breztri Aerosphere inhaler.  Very physically limited.  HPI   Prior to Admission medications   Medication Sig Start Date End Date Taking? Authorizing Provider  amLODipine (NORVASC) 5 MG tablet Take 1 tablet (5 mg total) by mouth daily. 08/09/19 11/07/19 Yes SagardiaInes Bloomer, MD  aspirin EC 81 MG EC tablet Take 1 tablet (81 mg total) by mouth daily. 04/17/19  Yes Debbe Odea, MD  bisoprolol (ZEBETA) 10 MG tablet Take 1 tablet (10 mg total) by mouth daily. 07/24/19  Yes Annita Brod, MD  Budeson-Glycopyrrol-Formoterol (BREZTRI AEROSPHERE) 160-9-4.8 MCG/ACT AERO Inhale 2 puffs into the lungs daily. 07/11/19  Yes Collene Gobble, MD  escitalopram (LEXAPRO) 10 MG tablet Take 1 tablet (10 mg total) by mouth daily. 08/23/19 11/21/19 Yes Catrice Zuleta, Ines Bloomer, MD  ipratropium-albuterol (DUONEB) 0.5-2.5 (3) MG/3ML SOLN Take 3 mLs by nebulization every 6 (six) hours as needed. 05/03/19  Yes Martyn Ehrich, NP  lisinopril (ZESTRIL) 10 MG tablet Take 1 tablet (10 mg total) by mouth daily. 09/06/19 12/05/19 Yes Donato Heinz, MD  rosuvastatin (CRESTOR) 20 MG tablet Take 20 mg by mouth daily.   Yes [provider]  nitroGLYCERIN (NITROSTAT) 0.4 MG SL tablet Place 1 tablet (0.4 mg total) under the tongue every 5 (five) minutes as needed for chest pain. Patient not taking: Reported on 09/20/2019 04/17/19   Debbe Odea, MD    Allergies  Allergen Reactions  . Stiolto  Respimat [Tiotropium Bromide-Olodaterol] Other (See Comments)    Severe headaches and rapid heartrate    Patient Active Problem List   Diagnosis Date Noted  . Pedal edema 08/04/2019  . Chronic respiratory failure with hypoxia ([place]) 03/07/2019  . CKD (chronic kidney disease) 01/05/2019  . COPD (chronic obstructive pulmonary disease) ([place]) 09/29/2018  . Pulmonary nodules 09/27/2018  . Dyslipidemia 07/07/2018  . Essential hypertension 03/10/2018    Past Medical History:  Diagnosis Date  . Asthma   . Hypertension     History reviewed. No pertinent surgical history.  Social History   Socioeconomic History  . Marital status: Single    Spouse name: Not on file  . Number of children: Not on file  . Years of education: Not on file  . Highest education level: Not on file  Occupational History  . Not on file  Tobacco Use  . Smoking status: Never Smoker  . Smokeless tobacco: Never Used  Substance and Sexual Activity  . Alcohol use: Yes    Alcohol/week: 2.0 standard drinks    Types: 2 Cans of beer per week  . Drug use: Never  . Sexual activity: Not on file  Other Topics Concern  . Not on file  Social History Narrative  . Not on file   Social Determinants of Health   Financial Resource Strain:   . Difficulty of Paying Living Expenses:   Food Insecurity:   . Worried About Charity fundraiser in the Last  Year:   . Ran Out of Food in the Last Year:   Transportation Needs:   . Freight forwarder (Medical):   [name] Lack of Transportation (Non-Medical):   Physical Activity:   . Days of Exercise per Week:   . Minutes of Exercise per Session:   Stress:   . Feeling of Stress :   Social Connections:   . Frequency of Communication with Friends and Family:   . Frequency of Social Gatherings with Friends and Family:   . Attends Religious Services:   . Active Member of Clubs or Organizations:   . Attends Banker Meetings:   [name] Marital Status:   Intimate Partner  Violence:   . Fear of Current or Ex-Partner:   . Emotionally Abused:   [name] Physically Abused:   . Sexually Abused:     Family History  Family history unknown: Yes     Review of Systems  Constitutional: Negative.  Negative for chills and fever.  HENT: Negative.  Negative for congestion and sore throat.   Respiratory: Positive for shortness of breath (With mild exertion). Negative for cough.   Cardiovascular: Positive for leg swelling (Feet edema).  Gastrointestinal: Negative for nausea and vomiting.  Genitourinary: Negative.  Negative for dysuria and hematuria.  Skin: Negative.  Negative for rash.  Neurological: Negative for dizziness and headaches.  All other systems reviewed and are negative.  Today's Vitals   09/20/19 1348  BP: (!) 160/93  Pulse: (!) 59  Temp: 97.9 F (36.6 C)  SpO2: 94%  Weight: 114 lb (51.7 kg)  Height:  (1.549 m)   Body mass index is 21.54 kg/m.   Physical Exam Vitals reviewed.  Constitutional:      Appearance: Normal appearance.     Comments: Wheelchair-bound  HENT:     Head: Normocephalic.  Eyes:     Extraocular Movements: Extraocular movements intact.     Pupils: Pupils are equal, round, and reactive to light.  Cardiovascular:     Rate and Rhythm: Normal rate and regular rhythm.     Heart sounds: Normal heart sounds.  Pulmonary:     Effort: Pulmonary effort is normal.     Breath sounds: No wheezing or rales.     Comments: Distant breath sounds Musculoskeletal:     Cervical back: Normal range of motion and neck supple.     Comments: Bilateral feet edema  Skin:    General: Skin is warm and dry.     Capillary Refill: Capillary refill takes less than 2 seconds.  Neurological:     General: No focal deficit present.     Mental Status: She is alert and oriented to person, place, and time.     Deep Tendon Reflexes: Reflexes normal.  Psychiatric:        Behavior: Behavior normal.     A total of 30 minutes was spent with the  patient, greater than 50% of which was in counseling/coordination of care regarding multiple chronic medical conditions, treatment and management, review of all medications and new changes including increasing doses of lisinopril and Lexapro, review of most recent office visit notes, review of most recent blood work results, need for psychiatric evaluation, prognosis and need for follow-up here.  ASSESSMENT & PLAN: Essential hypertension Elevated blood pressure.  Increase lisinopril to 20 mg daily.  Continue all other medications.  Current moderate episode of major depressive disorder without prior episode (HCC) Tolerating Lexapro well.  Will increase dose to 20 mg daily.  Will request psychiatric evaluation.   Kathleen Cameron was seen today for hypertension.  Diagnoses and all orders for this visit:  Current moderate episode of major depressive disorder without prior episode (HCC) -     escitalopram (LEXAPRO) 20 MG tablet; Take 1 tablet (20 mg total) by mouth daily. -     Ambulatory referral to Psychiatry  Pulmonary emphysema, unspecified emphysema type (HCC)  Essential hypertension  Stage 3b chronic kidney disease    Patient Instructions  Major Depressive Disorder, Adult Major depressive disorder (MDD) is a mental health condition. MDD often makes you feel sad, hopeless, or helpless. MDD can also cause symptoms in your body. MDD can affect your:  Work.  School.  Relationships.  Other normal activities. MDD can range from mild to very bad. It may occur once (single episode MDD). It can also occur many times (recurrent MDD). The main symptoms of MDD often include:  Feeling sad, depressed, or irritable most of the time.  Loss of interest. MDD symptoms also include:  Sleeping too much or too little.  Eating too much or too little.  A change in your weight.  Feeling tired (fatigue) or having low energy.  Feeling worthless.  Feeling guilty.  Trouble making  decisions.  Trouble thinking clearly.  Thoughts of suicide or harming others.  Feeling weak.  Feeling agitated.  Keeping yourself from being around other people (isolation). Follow these instructions at home: Activity  Do these things as told by your doctor: ? Go back to your normal activities. ? Exercise regularly. ? Spend time outdoors. Alcohol  Talk with your doctor about how alcohol can affect your antidepressant medicines.  Do not drink alcohol. Or, limit how much alcohol you drink. ? This means no more than 1 drink a day for nonpregnant women and 2 drinks a day for men. One drink equals one of these:  12 oz of beer.  5 oz of wine.  1 oz of hard liquor. General instructions  Take over-the-counter and prescription medicines only as told by your doctor.  Eat a healthy diet.  Get plenty of sleep.  Find activities that you enjoy. Make time to do them.  Think about joining a support group. Your doctor may be able to suggest a group for you.  Keep all follow-up visits as told by your doctor. This is important. Where to find more information:  [employer] Mental Illness: ? www.nami.org  [employer]: ? http://www.maynard.net/www.nimh.nih.gov  National Suicide Prevention Lifeline: ? (320)020-29521-(346)545-3868. This is free, 24-hour help. Contact a doctor if:  Your symptoms get worse.  You have new symptoms. Get help right away if:  You self-harm.  You see, hear, taste, smell, or feel things that are not present (hallucinate). If you ever feel like you may hurt yourself or others, or have thoughts about taking your own life, get help right away. You can go to your nearest emergency department or call:  Your local emergency services (911 in the [place]  A suicide crisis helpline, such as the National Suicide Prevention Lifeline: ? 727-882-57831-(346)545-3868. This is open 24 hours a day. This information is not intended to replace advice given to you by your  health care provider. Make sure you discuss any questions you have with your health care provider. Document Revised: 03/20/2017 Document Reviewed: 12/23/2015 Elsevier Patient Education  2020 [employer]     Hypertension, Adult High blood pressure (hypertension) is when the force of blood pumping through the arteries is too  strong. The arteries are the blood vessels that carry blood from the heart throughout the body. Hypertension forces the heart to work harder to pump blood and may cause arteries to become narrow or stiff. Untreated or uncontrolled hypertension can cause a heart attack, heart failure, a stroke, kidney disease, and other problems. A blood pressure reading consists of a higher number over a lower number. Ideally, your blood pressure should be below 120/80. The first ("top") number is called the systolic pressure. It is a measure of the pressure in your arteries as your heart beats. The second ("bottom") number is called the diastolic pressure. It is a measure of the pressure in your arteries as the heart relaxes. What are the causes? The exact cause of this condition is not known. There are some conditions that result in or are related to high blood pressure. What increases the risk? Some risk factors for high blood pressure are under your control. The following factors may make you more likely to develop this condition:  Smoking.  Having type 2 diabetes mellitus, high cholesterol, or both.  Not getting enough exercise or physical activity.  Being overweight.  Having too much fat, sugar, calories, or salt (sodium) in your diet.  Drinking too much alcohol. Some risk factors for high blood pressure may be difficult or impossible to change. Some of these factors include:  Having chronic kidney disease.  Having a family history of high blood pressure.  Age. Risk increases with age.  Race. You may be at higher risk if you are African American.  Gender. Men are at  higher risk than women before age 37. After age 60, women are at higher risk than men.  Having obstructive sleep apnea.  Stress. What are the signs or symptoms? High blood pressure may not cause symptoms. Very high blood pressure (hypertensive crisis) may cause:  Headache.  Anxiety.  Shortness of breath.  Nosebleed.  Nausea and vomiting.  Vision changes.  Severe chest pain.  Seizures. How is this diagnosed? This condition is diagnosed by measuring your blood pressure while you are seated, with your arm resting on a flat surface, your legs uncrossed, and your feet flat on the floor. The cuff of the blood pressure monitor will be placed directly against the skin of your upper arm at the level of your heart. It should be measured at least twice using the same arm. Certain conditions can cause a difference in blood pressure between your right and left arms. Certain factors can cause blood pressure readings to be lower or higher than normal for a short period of time:  When your blood pressure is higher when you are in a health care provider's office than when you are at home, this is called white coat hypertension. Most people with this condition do not need medicines.  When your blood pressure is higher at home than when you are in a health care provider's office, this is called masked hypertension. Most people with this condition may need medicines to control blood pressure. If you have a high blood pressure reading during one visit or you have normal blood pressure with other risk factors, you may be asked to:  Return on a different day to have your blood pressure checked again.  Monitor your blood pressure at home for 1 week or longer. If you are diagnosed with hypertension, you may have other blood or imaging tests to help your health care provider understand your overall risk for other conditions. How is this  treated? This condition is treated by making healthy lifestyle  changes, such as eating healthy foods, exercising more, and reducing your alcohol intake. Your health care provider may prescribe medicine if lifestyle changes are not enough to get your blood pressure under control, and if:  Your systolic blood pressure is above 130.  Your diastolic blood pressure is above 80. Your personal target blood pressure may vary depending on your medical conditions, your age, and other factors. Follow these instructions at home: Eating and drinking   Eat a diet that is high in fiber and potassium, and low in sodium, added sugar, and fat. An example eating plan is called the DASH (Dietary Approaches to Stop Hypertension) diet. To eat this way: ? Eat plenty of fresh fruits and vegetables. Try to fill one half of your plate at each meal with fruits and vegetables. ? Eat whole grains, such as whole-wheat pasta, brown rice, or whole-grain bread. Fill about one fourth of your plate with whole grains. ? Eat or drink low-fat dairy products, such as skim milk or low-fat yogurt. ? Avoid fatty cuts of meat, processed or cured meats, and poultry with skin. Fill about one fourth of your plate with lean proteins, such as fish, chicken without skin, beans, eggs, or tofu. ? Avoid pre-made and processed foods. These tend to be higher in sodium, added sugar, and fat.  Reduce your daily sodium intake. Most people with hypertension should eat less than 1,500 mg of sodium a day.  Do not drink alcohol if: ? Your health care provider tells you not to drink. ? You are pregnant, may be pregnant, or are planning to become pregnant.  If you drink alcohol: ? Limit how much you use to:  0-1 drink a day for women.  0-2 drinks a day for men. ? Be aware of how much alcohol is in your drink. In the [place] one drink equals one 12 oz bottle of beer (355 mL), one 5 oz glass of wine (148 mL), or one 1 oz glass of hard liquor (44 mL). Lifestyle   Work with your health care provider to maintain  a healthy body weight or to lose weight. Ask what an ideal weight is for you.  Get at least 30 minutes of exercise most days of the week. Activities may include walking, swimming, or biking.  Include exercise to strengthen your muscles (resistance exercise), such as Pilates or lifting weights, as part of your weekly exercise routine. Try to do these types of exercises for 30 minutes at least 3 days a week.  Do not use any products that contain nicotine or tobacco, such as cigarettes, e-cigarettes, and chewing tobacco. If you need help quitting, ask your health care provider.  Monitor your blood pressure at home as told by your health care provider.  Keep all follow-up visits as told by your health care provider. This is important. Medicines  Take over-the-counter and prescription medicines only as told by your health care provider. Follow directions carefully. Blood pressure medicines must be taken as prescribed.  Do not skip doses of blood pressure medicine. Doing this puts you at risk for problems and can make the medicine less effective.  Ask your health care provider about side effects or reactions to medicines that you should watch for. Contact a health care provider if you:  Think you are having a reaction to a medicine you are taking.  Have headaches that keep coming back (recurring).  Feel dizzy.  Have swelling in your  ankles.  Have trouble with your vision. Get help right away if you:  Develop a severe headache or confusion.  Have unusual weakness or numbness.  Feel faint.  Have severe pain in your chest or abdomen.  Vomit repeatedly.  Have trouble breathing. Summary  Hypertension is when the force of blood pumping through your arteries is too strong. If this condition is not controlled, it may put you at risk for serious complications.  Your personal target blood pressure may vary depending on your medical conditions, your age, and other factors. For most  people, a normal blood pressure is less than 120/80.  Hypertension is treated with lifestyle changes, medicines, or a combination of both. Lifestyle changes include losing weight, eating a healthy, low-sodium diet, exercising more, and limiting alcohol. This information is not intended to replace advice given to you by your health care provider. Make sure you discuss any questions you have with your health care provider. Document Revised: 12/16/2017 Document Reviewed: 12/16/2017 Elsevier Patient Education  2020 Elsevier Inc.        Edwina Barth, MD [facility]

## 2019-09-20 NOTE — Assessment & Plan Note (Signed)
Elevated blood pressure.  Increase lisinopril to 20 mg daily.  Continue all other medications.

## 2019-09-20 NOTE — Assessment & Plan Note (Signed)
Tolerating Lexapro well.  Will increase dose to 20 mg daily.  Will request psychiatric evaluation.

## 2019-09-27 ENCOUNTER — Telehealth: Payer: Self-pay | Admitting: Emergency Medicine

## 2019-09-27 MED ORDER — BREZTRI AEROSPHERE 160-9-4.8 MCG/ACT IN AERO: 2.0000 | INHALATION_SPRAY | Freq: Two times a day (BID) | RESPIRATORY_TRACT | 0 refills | Status: DC

## 2019-09-27 NOTE — Telephone Encounter (Signed)
Samples left up front for pickup.  Pt aware.  Nothing further needed at this time- will close encounter.   

## 2019-10-12 ENCOUNTER — Ambulatory Visit: Payer: Commercial Managed Care - PPO | Admitting: Psychology

## 2019-10-13 ENCOUNTER — Telehealth (INDEPENDENT_AMBULATORY_CARE_PROVIDER_SITE_OTHER): Payer: Commercial Managed Care - PPO | Admitting: Emergency Medicine

## 2019-10-13 ENCOUNTER — Other Ambulatory Visit: Payer: Self-pay

## 2019-10-13 ENCOUNTER — Encounter: Payer: Self-pay | Admitting: Emergency Medicine

## 2019-10-13 VITALS — Ht 61.0 in

## 2019-10-13 DIAGNOSIS — J439 Emphysema, unspecified: Secondary | ICD-10-CM

## 2019-10-13 DIAGNOSIS — F321 Major depressive disorder, single episode, moderate: Secondary | ICD-10-CM

## 2019-10-13 DIAGNOSIS — E785 Hyperlipidemia, unspecified: Secondary | ICD-10-CM

## 2019-10-13 DIAGNOSIS — R12 Heartburn: Secondary | ICD-10-CM

## 2019-10-13 DIAGNOSIS — I10 Essential (primary) hypertension: Secondary | ICD-10-CM

## 2019-10-13 DIAGNOSIS — N1832 Chronic kidney disease, stage 3b: Secondary | ICD-10-CM

## 2019-10-13 DIAGNOSIS — T50905A Adverse effect of unspecified drugs, medicaments and biological substances, initial encounter: Secondary | ICD-10-CM

## 2019-10-13 NOTE — Progress Notes (Addendum)
Telemedicine Encounter- SOAP NOTE Established Patient Patient: Home  Provider: Office     This telephone encounter was conducted with the patient's (or proxy's) verbal consent via audio telecommunications: yes/no: Yes Patient was instructed to have this encounter in a suitably private space; and to only have persons present to whom they give permission to participate. In addition, patient identity was confirmed by use of name plus two identifiers (DOB and address).  I discussed the limitations, risks, security and privacy concerns of performing an evaluation and management service by telephone and the availability of in person appointments. I also discussed with the patient that there may be a patient responsible charge related to this service. The patient expressed understanding and agreed to proceed.  I spent a total of TIME; 0 MIN TO 60 MIN: 20 minutes talking with the patient or their proxy.  Chief Complaint  Patient presents with  . Depression    f/u   . stomach pain after eatting    1 week     Subjective   Kathleen Cameron is a 54 y.o. female established patient. Telephone visit today for follow-up of depression.  On Lexapro 20 mg daily.  Had one episode of hallucinations several days ago that lasted about 1 hour.  No repeat events.  No EtOH consumption.  No other associated symptoms. Also has been having epigastric discomfort after eating for 1 week.  No other associated symptoms. Hypertension doing well with normal blood pressure readings at home.  HPI   Patient Active Problem List   Diagnosis Date Noted  . Current moderate episode of major depressive disorder without prior episode (HCC) 09/20/2019  . Pedal edema 08/04/2019  . Chronic respiratory failure with hypoxia (HCC) 03/07/2019  . CKD (chronic kidney disease) 01/05/2019  . COPD (chronic obstructive pulmonary disease) (HCC) 09/29/2018  . Pulmonary nodules 09/27/2018  . Dyslipidemia 07/07/2018  . Essential  hypertension 03/10/2018    Past Medical History:  Diagnosis Date  . Asthma   . Hypertension     Current Outpatient Medications  Medication Sig Dispense Refill  . amLODipine (NORVASC) 5 MG tablet Take 1 tablet (5 mg total) by mouth daily. 90 tablet 3  . aspirin EC 81 MG EC tablet Take 1 tablet (81 mg total) by mouth daily. 30 tablet 0  . bisoprolol (ZEBETA) 10 MG tablet Take 1 tablet (10 mg total) by mouth daily. 30 tablet 3  . Budeson-Glycopyrrol-Formoterol (BREZTRI AEROSPHERE) 160-9-4.8 MCG/ACT AERO Inhale 2 puffs into the lungs in the morning and at bedtime. 2 g 0  . escitalopram (LEXAPRO) 20 MG tablet Take 1 tablet (20 mg total) by mouth daily. 90 tablet 1  . ipratropium-albuterol (DUONEB) 0.5-2.5 (3) MG/3ML SOLN Take 3 mLs by nebulization every 6 (six) hours as needed. 360 mL 2  . lisinopril (ZESTRIL) 10 MG tablet Take 1 tablet (10 mg total) by mouth daily. 90 tablet 3  . nitroGLYCERIN (NITROSTAT) 0.4 MG SL tablet Place 1 tablet (0.4 mg total) under the tongue every 5 (five) minutes as needed for chest pain. 30 tablet 0  . rosuvastatin (CRESTOR) 20 MG tablet Take 20 mg by mouth daily.     No current facility-administered medications for this visit.    Allergies  Allergen Reactions  . Stiolto Respimat [Tiotropium Bromide-Olodaterol] Other (See Comments)    Severe headaches and rapid heartrate    Social History   Socioeconomic History  . Marital status: Single    Spouse name: Not on file  . Number  of children: Not on file  . Years of education: Not on file  . Highest education level: Not on file  Occupational History  . Not on file  Tobacco Use  . Smoking status: Never Smoker  . Smokeless tobacco: Never Used  Substance and Sexual Activity  . Alcohol use: Yes    Alcohol/week: 2.0 standard drinks    Types: 2 Cans of beer per week  . Drug use: Never  . Sexual activity: Not on file  Other Topics Concern  . Not on file  Social History Narrative  . Not on file    Social Determinants of Health   Financial Resource Strain:   . Difficulty of Paying Living Expenses:   Food Insecurity:   . Worried About Programme researcher, broadcasting/film/video in the Last Year:   . Barista in the Last Year:   Transportation Needs:   . Freight forwarder (Medical):   [name] Lack of Transportation (Non-Medical):   Physical Activity:   . Days of Exercise per Week:   . Minutes of Exercise per Session:   Stress:   . Feeling of Stress :   Social Connections:   . Frequency of Communication with Friends and Family:   . Frequency of Social Gatherings with Friends and Family:   . Attends Religious Services:   . Active Member of Clubs or Organizations:   . Attends Banker Meetings:   [name] Marital Status:   Intimate Partner Violence:   . Fear of Current or Ex-Partner:   . Emotionally Abused:   [name] Physically Abused:   . Sexually Abused:     Review of Systems  Constitutional: Negative.  Negative for chills and fever.  HENT: Negative.  Negative for congestion and sore throat.   Respiratory: Negative.  Negative for cough and shortness of breath.   Cardiovascular: Negative.  Negative for chest pain and palpitations.  Gastrointestinal: Positive for heartburn. Negative for abdominal pain, diarrhea, nausea and vomiting.  Genitourinary: Negative.  Negative for dysuria and hematuria.  Skin: Negative.  Negative for rash.  Neurological: Negative for dizziness and headaches.  Psychiatric/Behavioral: Positive for hallucinations (1 episode).  All other systems reviewed and are negative.   Objective  Alert and oriented x3 in no apparent respiratory distress. Vitals as reported by the patient: Today's Vitals   10/13/19 1320  Height: 5\' 1"  (1.549 m)    There are no diagnoses linked to this encounter. Kathleen Cameron was seen today for depression and stomach pain after eatting.  Diagnoses and all orders for this visit:  Current moderate episode of major depressive disorder  without prior episode (HCC) Comments: Much improved  Pulmonary emphysema, unspecified emphysema type (HCC)  Essential hypertension  Stage 3b chronic kidney disease  Dyslipidemia  Adverse effect of drug, initial encounter  Heartburn   Clinically stable.  No red flag signs or symptoms.  Continue present medications.  Advised to take Mylanta as needed for heartburn. Episode of hallucination most likely secondary to Lexapro.  However advised not to stop medication which is overall helping her.  Follow-up in the office as needed.   I discussed the assessment and treatment plan with the patient. The patient was provided an opportunity to ask questions and all were answered. The patient agreed with the plan and demonstrated an understanding of the instructions.   The patient was advised to call back or seek an in-person evaluation if the symptoms worsen or if the condition fails to improve as anticipated.  I  provided 20 minutes of non-face-to-face time during this encounter.  Horald Pollen, MD  Primary [facility] at [facility]

## 2019-10-19 ENCOUNTER — Other Ambulatory Visit: Payer: Self-pay | Admitting: *Deleted

## 2019-10-19 ENCOUNTER — Other Ambulatory Visit: Payer: Self-pay | Admitting: Emergency Medicine

## 2019-10-19 DIAGNOSIS — F321 Major depressive disorder, single episode, moderate: Secondary | ICD-10-CM

## 2019-10-19 NOTE — Telephone Encounter (Signed)
Pt called pharmacy and they stated they need the dr to re send these mediations for refill:   rosuvastatin (CRESTOR) 20 MG tablet [[id number]]   escitalopram (LEXAPRO) 20 MG tablet [[id number]]  bisoprolol (ZEBETA) 10 MG tablet [[id number]]    Pt would like a call when this is sent into the pharmacy. Please advise.

## 2019-10-20 MED ORDER — ROSUVASTATIN CALCIUM 20 MG PO TABS: 20.0000 mg | ORAL_TABLET | Freq: Every day | ORAL | 3 refills | Status: DC

## 2019-10-20 MED ORDER — BISOPROLOL FUMARATE 10 MG PO TABS: 10.0000 mg | ORAL_TABLET | Freq: Every day | ORAL | 3 refills | Status: DC

## 2019-10-20 MED ORDER — ESCITALOPRAM OXALATE 20 MG PO TABS: 20.0000 mg | ORAL_TABLET | Freq: Every day | ORAL | 1 refills | Status: DC

## 2019-10-20 NOTE — Telephone Encounter (Signed)
Prescriptions requested for Zebeta 10 and rosuvastatin 20 mg , not previously ordered by pcp.

## 2019-10-20 NOTE — Addendum Note (Signed)
Addended by: Alm Bustard R on: 10/20/2019 04:27 PM   Modules accepted: Orders

## 2019-10-25 ENCOUNTER — Telehealth: Payer: Self-pay | Admitting: Emergency Medicine

## 2019-10-25 NOTE — Telephone Encounter (Signed)
Called and spoke with pt letting her know that she needs to contact PCP in regards to stomach issues as she may need a GI referral. Pt verbalized understanding. Nothing further needed.

## 2019-10-27 ENCOUNTER — Encounter: Payer: Self-pay | Admitting: Emergency Medicine

## 2019-10-27 ENCOUNTER — Other Ambulatory Visit: Payer: Self-pay

## 2019-10-27 ENCOUNTER — Ambulatory Visit (INDEPENDENT_AMBULATORY_CARE_PROVIDER_SITE_OTHER): Payer: Self-pay | Admitting: Emergency Medicine

## 2019-10-27 VITALS — BP 173/91 | HR 58 | Temp 97.7°F | Resp 16 | Ht 61.0 in | Wt 111.0 lb

## 2019-10-27 DIAGNOSIS — J439 Emphysema, unspecified: Secondary | ICD-10-CM

## 2019-10-27 DIAGNOSIS — I10 Essential (primary) hypertension: Secondary | ICD-10-CM

## 2019-10-27 DIAGNOSIS — R1010 Upper abdominal pain, unspecified: Secondary | ICD-10-CM

## 2019-10-27 DIAGNOSIS — E785 Hyperlipidemia, unspecified: Secondary | ICD-10-CM

## 2019-10-27 MED ORDER — PANTOPRAZOLE SODIUM 40 MG PO TBEC: 40.0000 mg | DELAYED_RELEASE_TABLET | Freq: Every day | ORAL | 3 refills | Status: DC

## 2019-10-27 NOTE — Patient Instructions (Addendum)
   If you have lab work done today you will be contacted with your lab results within the next 2 weeks.  If you have not heard from us then please contact us. The fastest way to get your results is to register for My Chart.   IF you received an x-ray today, you will receive an invoice from [facility]. Please contact [facility] at [phone] with questions or concerns regarding your invoice.   IF you received labwork today, you will receive an invoice from [facility]. Please contact [facility] at [phone] with questions or concerns regarding your invoice.   Our billing staff will not be able to assist you with questions regarding bills from these companies.  You will be contacted with the lab results as soon as they are available. The fastest way to get your results is to activate your My Chart account. Instructions are located on the last page of this paperwork. If you have not heard from us regarding the results in 2 weeks, please contact this office.     Abdominal Pain, Adult Many things can cause belly (abdominal) pain. Most times, belly pain is not dangerous. Many cases of belly pain can be watched and treated at home. Sometimes, though, belly pain is serious. Your doctor will try to find the cause of your belly pain. Follow these instructions at home:  Medicines  Take over-the-counter and prescription medicines only as told by your doctor.  Do not take medicines that help you poop (laxatives) unless told by your doctor. General instructions  Watch your belly pain for any changes.  Drink enough fluid to keep your pee (urine) pale yellow.  Keep all follow-up visits as told by your doctor. This is important. Contact a doctor if:  Your belly pain changes or gets worse.  You are not hungry, or you lose weight without trying.  You are having trouble pooping (constipated) or have watery poop (diarrhea) for more than 2-3 days.  You have pain when you pee  or poop.  Your belly pain wakes you up at night.  Your pain gets worse with meals, after eating, or with certain foods.  You are vomiting and cannot keep anything down.  You have a fever.  You have blood in your pee. Get help right away if:  Your pain does not go away as soon as your doctor says it should.  You cannot stop vomiting.  Your pain is only in areas of your belly, such as the right side or the left lower part of the belly.  You have bloody or black poop, or poop that looks like tar.  You have very bad pain, cramping, or bloating in your belly.  You have signs of not having enough fluid or water in your body (dehydration), such as: ? Dark pee, very little pee, or no pee. ? Cracked lips. ? Dry mouth. ? Sunken eyes. ? Sleepiness. ? Weakness.  You have trouble breathing or chest pain. Summary  Many cases of belly pain can be watched and treated at home.  Watch your belly pain for any changes.  Take over-the-counter and prescription medicines only as told by your doctor.  Contact a doctor if your belly pain changes or gets worse.  Get help right away if you have very bad pain, cramping, or bloating in your belly. This information is not intended to replace advice given to you by your health care provider. Make sure you discuss any questions you have with your health   care provider. Document Revised: 08/16/2018 Document Reviewed: 08/16/2018 Elsevier Patient Education  2020 Elsevier Inc.  

## 2019-10-27 NOTE — Progress Notes (Signed)
Kathleen Cameron 54 y.o.   Chief Complaint  Patient presents with  . Abdominal Pain    per pt for 2 weeks near rib area    HISTORY OF PRESENT ILLNESS: This is a 54 y.o. female complaining of upper abdominal pain for the past 2 weeks.  Intermittent burning pain with no radiation and Kathleen associated with any other significant symptoms.  Denies nausea vomiting hematemesis rectal bleeding melena fever chills diarrhea.  Has been constipated and took MiraLAX with good results. No other complaints or medical concerns today.  HPI   Prior to Admission medications   Medication Sig Start Date End Date Taking? Authorizing Provider  amLODipine (NORVASC) 5 MG tablet Take 1 tablet (5 mg total) by mouth daily. 08/09/19 11/07/19 Yes SagardiaEilleen Kempf, MD  aspirin EC 81 MG EC tablet Take 1 tablet (81 mg total) by mouth daily. 04/17/19  Yes [name], MD  bisoprolol (ZEBETA) 10 MG tablet Take 1 tablet (10 mg total) by mouth daily. 10/20/19  Yes Saiquan Hands, Eilleen Kempf, MD  Budeson-Glycopyrrol-Formoterol (BREZTRI AEROSPHERE) 160-9-4.8 MCG/ACT AERO Inhale 2 puffs into the lungs in the morning and at bedtime. 09/27/19  Yes Leslye Peer, MD  escitalopram (LEXAPRO) 20 MG tablet Take 1 tablet (20 mg total) by mouth daily. 10/20/19 01/18/20 Yes Lareen Mullings, Eilleen Kempf, MD  ipratropium-albuterol (DUONEB) 0.5-2.5 (3) MG/3ML SOLN Take 3 mLs by nebulization every 6 (six) hours as needed. 05/03/19  Yes [name], NP  lisinopril (ZESTRIL) 10 MG tablet Take 1 tablet (10 mg total) by mouth daily. 09/06/19 12/05/19 Yes [name], MD  nitroGLYCERIN (NITROSTAT) 0.4 MG SL tablet Place 1 tablet (0.4 mg total) under the tongue every 5 (five) minutes as needed for chest pain. 04/17/19  Yes [name], MD  rosuvastatin (CRESTOR) 20 MG tablet Take 1 tablet (20 mg total) by mouth daily. 10/20/19 01/18/20 Yes SagardiaEilleen Kempf, MD    Allergies  Allergen Reactions  . Stiolto Respimat [Tiotropium  Bromide-Olodaterol] Other (See Comments)    Severe headaches and rapid heartrate    Patient Active Problem List   Diagnosis Date Noted  . Current moderate episode of major depressive disorder without prior episode (HCC) 09/20/2019  . Pedal edema 08/04/2019  . Chronic respiratory failure with hypoxia (HCC) 03/07/2019  . CKD (chronic kidney disease) 01/05/2019  . COPD (chronic obstructive pulmonary disease) (HCC) 09/29/2018  . Pulmonary nodules 09/27/2018  . Dyslipidemia 07/07/2018  . Essential hypertension 03/10/2018    Past Medical History:  Diagnosis Date  . Asthma   . Hypertension     History reviewed. No pertinent surgical history.  Social History   Socioeconomic History  . Marital status: Single    Spouse name: Kathleen Cameron  . Number of children: Kathleen Cameron  . Years of education: Kathleen Cameron  . Highest education level: Kathleen Cameron  Occupational History  . Kathleen Cameron  Tobacco Use  . Smoking status: Never Smoker  . Smokeless tobacco: Never Used  Substance and Sexual Activity  . Alcohol use: Yes    Alcohol/week: 2.0 standard drinks    Types: 2 Cans of beer per week  . Drug use: Never  . Sexual activity: Kathleen Cameron  Other Topics Concern  . Kathleen Cameron  Social History Narrative  . Kathleen Cameron   Social Determinants of Health   Financial Resource Strain:   . Difficulty of Paying Living Expenses:   Food Insecurity:   . Worried About  Running Out of Food in the Last Year:   . Ran Out of Food in the Last Year:   Transportation Needs:   . Lack of Transportation (Medical):   [name] Lack of Transportation (Non-Medical):   Physical Activity:   . Days of Exercise per Week:   . Minutes of Exercise per Session:   Stress:   . Feeling of Stress :   Social Connections:   . Frequency of Communication with Friends and Family:   . Frequency of Social Gatherings with Friends and Family:   . Attends Religious Services:   . Active Member of Clubs or Organizations:   .  Attends Banker Meetings:   [name] Marital Status:   Intimate Partner Violence:   . Fear of Current or Ex-Partner:   . Emotionally Abused:   [name] Physically Abused:   . Sexually Abused:     Family History  Family history unknown: Yes     Review of Systems  Constitutional: Negative.  Negative for chills and fever.  HENT: Negative.   Respiratory: Negative.  Negative for cough and shortness of breath.   Cardiovascular: Negative.  Negative for chest pain and palpitations.  Gastrointestinal: Positive for abdominal pain, constipation and heartburn. Negative for blood in stool, diarrhea, melena, nausea and vomiting.  Genitourinary: Negative.  Negative for dysuria and hematuria.  Musculoskeletal: Negative.  Negative for back pain, myalgias and neck pain.  Skin: Negative.  Negative for rash.  Neurological: Negative for dizziness and headaches.    Today's Vitals   10/27/19 1116  BP: (!) 173/91  Pulse: (!) 58  Resp: 16  Temp: 97.7 F (36.5 C)  TempSrc: Temporal  SpO2: 99%  Weight: 111 lb (50.3 kg)  Height: 5\' 1"  (1.549 m)   Body mass index is 20.97 kg/m.  Physical Exam Vitals reviewed.  Constitutional:      Appearance: She is well-developed.  HENT:     Head: Normocephalic.  Eyes:     Extraocular Movements: Extraocular movements intact.     Pupils: Pupils are equal, round, and reactive to light.  Cardiovascular:     Rate and Rhythm: Normal rate and regular rhythm.     Heart sounds: Normal heart sounds.  Pulmonary:     Effort: Pulmonary effort is normal.     Breath sounds: Normal breath sounds.  Abdominal:     General: Bowel sounds are normal.     Palpations: Abdomen is soft.     Tenderness: There is abdominal tenderness in the epigastric area. There is no guarding or rebound.  Musculoskeletal:        General: Normal range of motion.     Cervical back: Normal range of motion and neck supple.  Skin:    General: Skin is warm and dry.     Capillary Refill:  Capillary refill takes less than 2 seconds.  Neurological:     General: No focal deficit present.     Mental Status: She is alert and oriented to person, place, and time.  Psychiatric:        Mood and Affect: Mood normal.        Behavior: Behavior normal.    A total of 30 minutes was spent with the patient, greater than 50% of which was in counseling/coordination of care regarding differential diagnosis of upper abdominal pain, treatment, review of all medications plus new one, Protonix, review of most recent office visit review of most recent blood work results, prognosis and need for follow-up .  ASSESSMENT & PLAN: Kathleen Cameron was seen today for abdominal pain.  Diagnoses and all orders for this visit:  Upper abdominal pain -     Comprehensive metabolic panel -     Lipase -     pantoprazole (PROTONIX) 40 MG tablet; Take 1 tablet (40 mg total) by mouth daily.  Pulmonary emphysema, unspecified emphysema type (HCC)  Essential hypertension  Dyslipidemia    Patient Instructions       If you have lab work done today you will be contacted with your lab results within the next 2 weeks.  If you have Kathleen heard from us then please contact us. The fastest way to get your results is to register for My Chart.   IF you received an x-ray today, you will receive an invoice [facility]. Please contact [facility] at [phone] with questions or concerns regarding your invoice.   IF you received labwork today, you will receive an invoice from [facility]. Please contact [facility] at [phone] with questions or concerns regarding your invoice.   Our billing staff will Kathleen be able to assist you with questions regarding bills from these companies.  You will be contacted with the lab results as soon as they are available. The fastest way to get your results is to activate your My Chart account. Instructions are located on the last page of this paperwork. If you have  Kathleen heard from us regarding the results in 2 weeks, please contact this office.     Abdominal Pain, Adult Many things can cause belly (abdominal) pain. Most times, belly pain is Kathleen dangerous. Many cases of belly pain can be watched and treated at home. Sometimes, though, belly pain is serious. Your doctor will try to find the cause of your belly pain. Follow these instructions at home:  Medicines  Take over-the-counter and prescription medicines only as told by your doctor.  Do Kathleen take medicines that help you poop (laxatives) unless told by your doctor. General instructions  Watch your belly pain for any changes.  Drink enough fluid to keep your pee (urine) pale yellow.  Keep all follow-up visits as told by your doctor. This is important. Contact a doctor if:  Your belly pain changes or gets worse.  You are Kathleen hungry, or you lose weight without trying.  You are having trouble pooping (constipated) or have watery poop (diarrhea) for more than 2-3 days.  You have pain when you pee or poop.  Your belly pain wakes you up at night.  Your pain gets worse with meals, after eating, or with certain foods.  You are vomiting and cannot keep anything down.  You have a fever.  You have blood in your pee. Get help right away if:  Your pain does Kathleen go away as soon as your doctor says it should.  You cannot stop vomiting.  Your pain is only in areas of your belly, such as the right side or the left lower part of the belly.  You have bloody or black poop, or poop that looks like tar.  You have very bad pain, cramping, or bloating in your belly.  You have signs of Kathleen having enough fluid or water in your body (dehydration), such as: ? Dark pee, very little pee, or no pee. ? Cracked lips. ? Dry mouth. ? Sunken eyes. ? Sleepiness. ? Weakness.  You have trouble breathing or chest pain. Summary  Many cases of belly pain can be watched and treated at home.  Watch your  belly pain for any changes.  Take over-the-counter and prescription medicines only as told by your doctor.  Contact a doctor if your belly pain changes or gets worse.  Get help right away if you have very bad pain, cramping, or bloating in your belly. This information is Kathleen intended to replace advice given to you by your health care provider. Make sure you discuss any questions you have with your health care provider. Document Revised: 08/16/2018 Document Reviewed: 08/16/2018 Elsevier Patient Education  2020 Elsevier Inc.      Edwina Barth, MD [facility]

## 2019-10-28 LAB — COMPREHENSIVE METABOLIC PANEL
ALT: 10 IU/L (ref 0–32)
AST: 20 IU/L (ref 0–40)
Albumin/Globulin Ratio: 1.4 (ref 1.2–2.2)
Albumin: 4.5 g/dL (ref 3.8–4.9)
Alkaline Phosphatase: 69 IU/L (ref 48–121)
BUN/Creatinine Ratio: 16 (ref 9–23)
BUN: 15 mg/dL (ref 6–24)
Bilirubin Total: <0.2 mg/dL (ref 0.0–1.2)
CO2: 37 mmol/L (ref 20–29)
Calcium: 10 mg/dL (ref 8.7–10.2)
Chloride: 90 mmol/L — ABNORMAL LOW (ref 96–106)
Creatinine, Ser: 0.95 mg/dL (ref 0.57–1.00)
GFR calc Af Amer: 77 mL/min/{1.73_m2} (ref >59)
GFR calc non Af Amer: 67 mL/min/{1.73_m2} (ref >59)
Globulin, Total: 3.3 g/dL (ref 1.5–4.5)
Glucose: 92 mg/dL (ref 65–99)
Potassium: 4.2 mmol/L (ref 3.5–5.2)
Sodium: 138 mmol/L (ref 134–144)
Total Protein: 7.8 g/dL (ref 6.0–8.5)

## 2019-10-28 LAB — LIPASE: Lipase: 16 U/L (ref 14–72)

## 2019-10-31 ENCOUNTER — Ambulatory Visit: Payer: Commercial Managed Care - PPO | Admitting: Emergency Medicine

## 2019-11-01 ENCOUNTER — Telehealth: Payer: Self-pay | Admitting: Emergency Medicine

## 2019-11-01 DIAGNOSIS — Z0289 Encounter for other administrative examinations: Secondary | ICD-10-CM

## 2019-11-01 NOTE — Telephone Encounter (Signed)
Pt's husband dropped off FMLA pprwrk. Has paid admin fee. Would like cb for pick up.  Pprwrk in provider's box at nurses station

## 2019-11-02 ENCOUNTER — Other Ambulatory Visit: Payer: Self-pay

## 2019-11-02 ENCOUNTER — Emergency Department (HOSPITAL_COMMUNITY): Payer: Commercial Managed Care - PPO

## 2019-11-02 ENCOUNTER — Emergency Department (HOSPITAL_COMMUNITY)
Admission: EM | Admit: 2019-11-02 | Discharge: 2019-11-03 | Disposition: A | Discharge status: Home / Self Care | Payer: Commercial Managed Care - PPO | Attending: Emergency Medicine | Admitting: Emergency Medicine

## 2019-11-02 DIAGNOSIS — J45909 Unspecified asthma, uncomplicated: Secondary | ICD-10-CM | POA: Diagnosis not present

## 2019-11-02 DIAGNOSIS — J9611 Chronic respiratory failure with hypoxia: Secondary | ICD-10-CM | POA: Diagnosis not present

## 2019-11-02 DIAGNOSIS — Z79899 Other long term (current) drug therapy: Secondary | ICD-10-CM | POA: Diagnosis not present

## 2019-11-02 DIAGNOSIS — Z20822 Contact with and (suspected) exposure to covid-19: Secondary | ICD-10-CM | POA: Insufficient documentation

## 2019-11-02 DIAGNOSIS — N189 Chronic kidney disease, unspecified: Secondary | ICD-10-CM | POA: Diagnosis not present

## 2019-11-02 DIAGNOSIS — R0602 Shortness of breath: Secondary | ICD-10-CM | POA: Diagnosis present

## 2019-11-02 DIAGNOSIS — J441 Chronic obstructive pulmonary disease with (acute) exacerbation: Secondary | ICD-10-CM

## 2019-11-02 DIAGNOSIS — I129 Hypertensive chronic kidney disease with stage 1 through stage 4 chronic kidney disease, or unspecified chronic kidney disease: Secondary | ICD-10-CM | POA: Diagnosis not present

## 2019-11-02 DIAGNOSIS — Z7982 Long term (current) use of aspirin: Secondary | ICD-10-CM | POA: Insufficient documentation

## 2019-11-02 LAB — CBC
HCT: 43.1 % (ref 36.0–46.0)
Hemoglobin: 13.3 g/dL (ref 12.0–15.0)
MCH: 28.8 pg (ref 26.0–34.0)
MCHC: 30.9 g/dL (ref 30.0–36.0)
MCV: 93.3 fL (ref 80.0–100.0)
Platelets: 262 10*3/uL (ref 150–400)
RBC: 4.62 MIL/uL (ref 3.87–5.11)
RDW: 13.1 % (ref 11.5–15.5)
WBC: 11.7 10*3/uL — ABNORMAL HIGH (ref 4.0–10.5)
nRBC: 0 % (ref 0.0–0.2)

## 2019-11-02 LAB — BASIC METABOLIC PANEL
Anion gap: 12 (ref 5–15)
BUN: 17 mg/dL (ref 6–20)
CO2: 37 mmol/L — ABNORMAL HIGH (ref 22–32)
Calcium: 9.6 mg/dL (ref 8.9–10.3)
Chloride: 91 mmol/L — ABNORMAL LOW (ref 98–111)
Creatinine, Ser: 0.82 mg/dL (ref 0.44–1.00)
GFR calc Af Amer: >60 mL/min (ref >60)
GFR calc non Af Amer: >60 mL/min (ref >60)
Glucose, Bld: 169 mg/dL — ABNORMAL HIGH (ref 70–99)
Potassium: 3.6 mmol/L (ref 3.5–5.1)
Sodium: 140 mmol/L (ref 135–145)

## 2019-11-02 MED ORDER — ALBUTEROL SULFATE HFA 108 (90 BASE) MCG/ACT IN AERS
2.0000 | INHALATION_SPRAY | Freq: Once | RESPIRATORY_TRACT | Status: AC
  Administered 2019-11-02: 2 via RESPIRATORY_TRACT
  Filled 2019-11-02: qty 6.7

## 2019-11-02 NOTE — ED Provider Notes (Addendum)
[facility]-EMERGENCY DEPT Provider Note   CSN: [id number] Arrival date & time: 11/02/19  2208     History Chief Complaint  Patient presents with  . Respiratory Distress    Kathleen Cameron is a 54 y.o. female.  Patient brought in by EMS.  Apparently brought in on their version of CPAP.  Patient has a history of hypertension and COPD.  Normally on 2 L of oxygen at all times.  She has had kind of an upper respiratory infection for about a week and then got acutely short of breath here today.  Uses albuterol inhalers at home.  Not currently on steroids.  EMS gave her 125 mg of Solu-Medrol gave her nebulizer treatment and gave her magnesium.  On arrival here patient still stated that she was having difficulty breathing however there was no wheezing.  There was some decreased breath sounds bilaterally.  No leg swelling no chest pain.  No fevers.  Patient has not had any of the Covid vaccines.  Patient felt as if she was wheezing at home.        Past Medical History:  Diagnosis Date  . Asthma   . Hypertension     Patient Active Problem List   Diagnosis Date Noted  . Current moderate episode of major depressive disorder without prior episode (HCC) 09/20/2019  . Pedal edema 08/04/2019  . Chronic respiratory failure with hypoxia (HCC) 03/07/2019  . CKD (chronic kidney disease) 01/05/2019  . COPD (chronic obstructive pulmonary disease) (HCC) 09/29/2018  . Pulmonary nodules 09/27/2018  . Dyslipidemia 07/07/2018  . Essential hypertension 03/10/2018    No past surgical history on file.   OB History   No obstetric history on file.     Family History  Family history unknown: Yes    Social History   Tobacco Use  . Smoking status: Never Smoker  . Smokeless tobacco: Never Used  Substance Use Topics  . Alcohol use: Yes    Alcohol/week: 2.0 standard drinks    Types: 2 Cans of beer per week  . Drug use: Never    Home Medications Prior to  Admission medications   Medication Sig Start Date End Date Taking? Authorizing Provider  amLODipine (NORVASC) 5 MG tablet Take 1 tablet (5 mg total) by mouth daily. 08/09/19 11/07/19  [name], MD  aspirin EC 81 MG EC tablet Take 1 tablet (81 mg total) by mouth daily. 04/17/19   [name], MD  bisoprolol (ZEBETA) 10 MG tablet Take 1 tablet (10 mg total) by mouth daily. 10/20/19   [name], MD  Budeson-Glycopyrrol-Formoterol (BREZTRI AEROSPHERE) 160-9-4.8 MCG/ACT AERO Inhale 2 puffs into the lungs in the morning and at bedtime. 09/27/19   Leslye Peer, MD  escitalopram (LEXAPRO) 20 MG tablet Take 1 tablet (20 mg total) by mouth daily. 10/20/19 01/18/20  [name], MD  ipratropium-albuterol (DUONEB) 0.5-2.5 (3) MG/3ML SOLN Take 3 mLs by nebulization every 6 (six) hours as needed. 05/03/19   [name], NP  lisinopril (ZESTRIL) 10 MG tablet Take 1 tablet (10 mg total) by mouth daily. 09/06/19 12/05/19  [name], MD  nitroGLYCERIN (NITROSTAT) 0.4 MG SL tablet Place 1 tablet (0.4 mg total) under the tongue every 5 (five) minutes as needed for chest pain. 04/17/19   [name], MD  pantoprazole (PROTONIX) 40 MG tablet Take 1 tablet (40 mg total) by mouth daily. 10/27/19   [name], MD  rosuvastatin (CRESTOR) 20 MG tablet Take  1 tablet (20 mg total) by mouth daily. 10/20/19 01/18/20  [name], MD    Allergies    Stiolto respimat [tiotropium bromide-olodaterol]  Review of Systems   Review of Systems  Constitutional: Negative for chills and fever.  HENT: Positive for congestion. Negative for rhinorrhea and sore throat.   Eyes: Negative for visual disturbance.  Respiratory: Positive for shortness of breath and wheezing. Negative for cough.   Cardiovascular: Negative for chest pain and leg swelling.  Gastrointestinal: Negative for abdominal pain, diarrhea, nausea and vomiting.  Genitourinary: Negative for dysuria.    Musculoskeletal: Negative for back pain and neck pain.  Skin: Negative for rash.  Neurological: Negative for dizziness, light-headedness and headaches.  Hematological: Does not bruise/bleed easily.  Psychiatric/Behavioral: Negative for confusion.    Physical Exam Updated Vital Signs BP (!) 166/98   Pulse 74   Resp (!) 21   SpO2 96%   Physical Exam Vitals and nursing note reviewed.  Constitutional:      General: She is in acute distress.     Appearance: Normal appearance. She is well-developed.  HENT:     Head: Normocephalic and atraumatic.  Eyes:     Extraocular Movements: Extraocular movements intact.     Conjunctiva/sclera: Conjunctivae normal.     Pupils: Pupils are equal, round, and reactive to light.  Cardiovascular:     Rate and Rhythm: Normal rate and regular rhythm.     Heart sounds: No murmur heard.   Pulmonary:     Effort: Respiratory distress present.     Breath sounds: No stridor. No wheezing or rhonchi.     Comments: Decreased breath sounds bilaterally Abdominal:     Palpations: Abdomen is soft.     Tenderness: There is no abdominal tenderness.  Musculoskeletal:        General: No swelling. Normal range of motion.     Cervical back: Normal range of motion and neck supple.  Skin:    General: Skin is warm and dry.  Neurological:     General: No focal deficit present.     Mental Status: She is alert and oriented to person, place, and time.     ED Results / Procedures / Treatments   Labs (all labs ordered are listed, but only abnormal results are displayed) Labs Reviewed  CBC - Abnormal; Notable for the following components:      Result Value   WBC 11.7 (*)    All other components within normal limits  BASIC METABOLIC PANEL - Abnormal; Notable for the following components:   Chloride 91 (*)    CO2 37 (*)    Glucose, Bld 169 (*)    All other components within normal limits  SARS CORONAVIRUS 2 BY RT PCR [facility] ORDER, PERFORMED IN [facility])    EKG EKG Interpretation  Date/Time:  Wednesday November 02 2019 22:42:21 EDT Ventricular Rate:  83 PR Interval:    QRS Duration: 86 QT Interval:  406 QTC Calculation: 478 R Axis:   86 Text Interpretation: Sinus rhythm Biatrial enlargement Probable anteroseptal infarct, old Artifact in lead(s) I II III aVR aVL aVF V5 V6 Interpretation limited secondary to artifact Confirmed by Vanetta Mulders [phone]) on 11/03/2019 12:24:09 AM   Radiology DG Chest Port 1 View  Result Date: 11/02/2019 CLINICAL DATA:  Shortness.  Respiratory distress. EXAM: PORTABLE CHEST 1 VIEW COMPARISON:  Chest radiograph 07/23/2019. Included portions from cardiac CT 06/08/2019, chest CT 04/13/2019 FINDINGS: Mild chronic hyperinflation. Linear opacities in both  mid lower lung zones typical of subsegmental atelectasis or scarring. Right upper lobe nodular densities there pleural based on prior CT. Heart is normal in size. Unchanged mediastinal contours with mild aortic tortuosity. No confluent airspace disease, pulmonary edema, large pleural effusion or pneumothorax. No acute osseous abnormalities are seen. IMPRESSION: 1. Streaky bibasilar atelectasis or scarring. 2. Chronic mild hyperinflation. Electronically Signed   By: Narda Rutherford M.D.   On: 11/02/2019 22:45    Procedures Procedures (including critical care time)  Medications Ordered in ED Medications  albuterol (VENTOLIN HFA) 108 (90 Base) MCG/ACT inhaler 2 puff (2 puffs Inhalation Given 11/02/19 2330)    ED Course  I have reviewed the triage vital signs and the nursing notes.  Pertinent labs & imaging results that were available during my care of the patient were reviewed by me and considered in my medical decision making (see chart for details).    MDM Rules/Calculators/A&P                          Patient initially was switched from their CPAP EMS CPAP 200% nonrebreather.  Oxygen sats were very high on that.  Patient switched over to 4 L  nasal cannula oxygen.  Oxygen saturations 99% on that.  Decreased down to her normal 2 L of oxygen oxygen sats around 94%.  Patient still feels short of breath but do not hear any wheezing.  Patient given additional albuterol inhaler treatment here.  Patient is already had 125 mg of Solu-Medrol.  Chest x-ray negative for any signs of pneumonia.  Covid test ordered.  And overall improving speaking in full sentences.  But still feels as if her breathing is not back to normal.   If patient is able to go home we will treat her with antibiotic of Augmentin for may be an exacerbation of her bronchitis have her take prednisone for the next 5 days.  Continue to use the albuterol inhaler.  Continue to observe the patient at this time. Patient continues to do well here.  Feeling better slowly.  Feel that the Solu-Medrol starting to kick in.  Patient's oxygen saturations on 2 L is anywhere from 91 to 94%.  We will go ahead and give a dose of Augmentin here tonight.  We will have her continue Augmentin.  And will have her continue prednisone for the next 5 days.  Patient's husband's medical home and get her oxygen to take her home.  Covid test is still pending.  With patient not meeting indications for admission.   Final Clinical Impression(s) / ED Diagnoses Final diagnoses:  COPD exacerbation [facility])    Rx / DC Orders ED Discharge Orders    None       Vanetta Mulders, MD 11/03/19 Danford Bad, MD 11/03/19 0028

## 2019-11-02 NOTE — Telephone Encounter (Signed)
Spoke with CMA and she it has been started.

## 2019-11-02 NOTE — ED Notes (Signed)
Unable to obtain temperature due to CPAP/NRB.

## 2019-11-02 NOTE — ED Triage Notes (Signed)
PER EMS: Patient is coming from home with c/o respiratory distress. Patients SPO2 was originally in the lower 80's when EMS arrived. Lung sounds were absent. Patient placed on CPAP.  EMS VITALS: BP 208/120 HR 100 RR 26  EMS MEDS: 5 abulterol 125 solu medrol 2 g magnesium

## 2019-11-03 LAB — SARS CORONAVIRUS 2 BY RT PCR (HOSPITAL ORDER, PERFORMED IN ~~LOC~~ HOSPITAL LAB): SARS Coronavirus 2: NEGATIVE

## 2019-11-03 MED ORDER — PREDNISONE 10 MG PO TABS
40.0000 mg | ORAL_TABLET | Freq: Every day | ORAL | 0 refills | Status: DC
Start: 2019-11-03 — End: 2019-11-12

## 2019-11-03 MED ORDER — AMOXICILLIN-POT CLAVULANATE 875-125 MG PO TABS
1.0000 | ORAL_TABLET | Freq: Once | ORAL | Status: AC
  Administered 2019-11-03: 1 via ORAL
  Filled 2019-11-03: qty 1

## 2019-11-03 MED ORDER — AMOXICILLIN-POT CLAVULANATE 875-125 MG PO TABS
1.0000 | ORAL_TABLET | Freq: Two times a day (BID) | ORAL | 0 refills | Status: DC
Start: 2019-11-03 — End: 2019-11-12

## 2019-11-03 NOTE — Discharge Instructions (Signed)
To need to use your albuterol inhaler 2 puffs every 6 hours.  Take the prednisone as directed for the next 5 days.  Take the antibiotic as directed for the next 7 days.  Return for any new or worse symptoms.  Continue use your 2 L of oxygen at home.  Can appointment to follow-up with your regular doctor for recheck in a few days.

## 2019-11-03 NOTE — ED Notes (Signed)
Family at bedside with personal wheelchair and oxygen tank.

## 2019-11-05 ENCOUNTER — Inpatient Hospital Stay (HOSPITAL_COMMUNITY)
Admission: EM | Admit: 2019-11-05 | Discharge: 2019-11-12 | DRG: 190 | Disposition: A | Discharge status: Home Health | Payer: Commercial Managed Care - PPO | Attending: Internal Medicine | Admitting: Internal Medicine

## 2019-11-05 ENCOUNTER — Emergency Department (HOSPITAL_COMMUNITY): Payer: Commercial Managed Care - PPO

## 2019-11-05 ENCOUNTER — Other Ambulatory Visit: Payer: Self-pay

## 2019-11-05 DIAGNOSIS — J9621 Acute and chronic respiratory failure with hypoxia: Secondary | ICD-10-CM | POA: Diagnosis present

## 2019-11-05 DIAGNOSIS — Z79899 Other long term (current) drug therapy: Secondary | ICD-10-CM

## 2019-11-05 DIAGNOSIS — Z7289 Other problems related to lifestyle: Secondary | ICD-10-CM

## 2019-11-05 DIAGNOSIS — F32A Depression, unspecified: Secondary | ICD-10-CM

## 2019-11-05 DIAGNOSIS — Z888 Allergy status to other drugs, medicaments and biological substances status: Secondary | ICD-10-CM

## 2019-11-05 DIAGNOSIS — Z23 Encounter for immunization: Secondary | ICD-10-CM

## 2019-11-05 DIAGNOSIS — F419 Anxiety disorder, unspecified: Secondary | ICD-10-CM | POA: Diagnosis present

## 2019-11-05 DIAGNOSIS — Z7982 Long term (current) use of aspirin: Secondary | ICD-10-CM

## 2019-11-05 DIAGNOSIS — F329 Major depressive disorder, single episode, unspecified: Secondary | ICD-10-CM | POA: Diagnosis present

## 2019-11-05 DIAGNOSIS — I10 Essential (primary) hypertension: Secondary | ICD-10-CM | POA: Diagnosis present

## 2019-11-05 DIAGNOSIS — K219 Gastro-esophageal reflux disease without esophagitis: Secondary | ICD-10-CM | POA: Diagnosis present

## 2019-11-05 DIAGNOSIS — Z20822 Contact with and (suspected) exposure to covid-19: Secondary | ICD-10-CM | POA: Diagnosis present

## 2019-11-05 DIAGNOSIS — J9622 Acute and chronic respiratory failure with hypercapnia: Secondary | ICD-10-CM | POA: Diagnosis present

## 2019-11-05 DIAGNOSIS — R911 Solitary pulmonary nodule: Secondary | ICD-10-CM | POA: Diagnosis present

## 2019-11-05 DIAGNOSIS — Z9981 Dependence on supplemental oxygen: Secondary | ICD-10-CM

## 2019-11-05 DIAGNOSIS — J9690 Respiratory failure, unspecified, unspecified whether with hypoxia or hypercapnia: Secondary | ICD-10-CM | POA: Diagnosis present

## 2019-11-05 DIAGNOSIS — J441 Chronic obstructive pulmonary disease with (acute) exacerbation: Principal | ICD-10-CM | POA: Diagnosis present

## 2019-11-05 DIAGNOSIS — E785 Hyperlipidemia, unspecified: Secondary | ICD-10-CM | POA: Diagnosis present

## 2019-11-05 LAB — COMPREHENSIVE METABOLIC PANEL
ALT: 20 U/L (ref 0–44)
AST: 25 U/L (ref 15–41)
Albumin: 4.2 g/dL (ref 3.5–5.0)
Alkaline Phosphatase: 54 U/L (ref 38–126)
Anion gap: 13 (ref 5–15)
BUN: 19 mg/dL (ref 6–20)
CO2: 36 mmol/L — ABNORMAL HIGH (ref 22–32)
Calcium: 9.4 mg/dL (ref 8.9–10.3)
Chloride: 86 mmol/L — ABNORMAL LOW (ref 98–111)
Creatinine, Ser: 0.71 mg/dL (ref 0.44–1.00)
GFR calc Af Amer: >60 mL/min (ref >60)
GFR calc non Af Amer: >60 mL/min (ref >60)
Glucose, Bld: 117 mg/dL — ABNORMAL HIGH (ref 70–99)
Potassium: 3.6 mmol/L (ref 3.5–5.1)
Sodium: 135 mmol/L (ref 135–145)
Total Bilirubin: 0.6 mg/dL (ref 0.3–1.2)
Total Protein: 7.9 g/dL (ref 6.5–8.1)

## 2019-11-05 LAB — I-STAT VENOUS BLOOD GAS, ED
Acid-Base Excess: 13 mmol/L — ABNORMAL HIGH (ref 0.0–2.0)
Bicarbonate: 43.7 mmol/L — ABNORMAL HIGH (ref 20.0–28.0)
Calcium, Ion: 1.18 mmol/L (ref 1.15–1.40)
HCT: 42 % (ref 36.0–46.0)
Hemoglobin: 14.3 g/dL (ref 12.0–15.0)
O2 Saturation: 93 %
Potassium: 3.7 mmol/L (ref 3.5–5.1)
Sodium: 133 mmol/L — ABNORMAL LOW (ref 135–145)
TCO2: 46 mmol/L — ABNORMAL HIGH (ref 22–32)
pCO2, Ven: 88.7 mmHg (ref 44.0–60.0)
pH, Ven: 7.301 (ref 7.250–7.430)
pO2, Ven: 77 mmHg — ABNORMAL HIGH (ref 32.0–45.0)

## 2019-11-05 LAB — CBC WITH DIFFERENTIAL/PLATELET
Abs Immature Granulocytes: 0.04 10*3/uL (ref 0.00–0.07)
Basophils Absolute: 0 10*3/uL (ref 0.0–0.1)
Basophils Relative: 1 %
Eosinophils Absolute: 0.2 10*3/uL (ref 0.0–0.5)
Eosinophils Relative: 3 %
HCT: 40.8 % (ref 36.0–46.0)
Hemoglobin: 13.1 g/dL (ref 12.0–15.0)
Immature Granulocytes: 1 %
Lymphocytes Relative: 11 %
Lymphs Abs: 0.8 10*3/uL (ref 0.7–4.0)
MCH: 28.7 pg (ref 26.0–34.0)
MCHC: 32.1 g/dL (ref 30.0–36.0)
MCV: 89.5 fL (ref 80.0–100.0)
Monocytes Absolute: 0.2 10*3/uL (ref 0.1–1.0)
Monocytes Relative: 3 %
Neutro Abs: 6 10*3/uL (ref 1.7–7.7)
Neutrophils Relative %: 81 %
Platelets: 246 10*3/uL (ref 150–400)
RBC: 4.56 MIL/uL (ref 3.87–5.11)
RDW: 13.1 % (ref 11.5–15.5)
WBC: 7.3 10*3/uL (ref 4.0–10.5)
nRBC: 0 % (ref 0.0–0.2)

## 2019-11-05 LAB — SARS CORONAVIRUS 2 BY RT PCR (HOSPITAL ORDER, PERFORMED IN ~~LOC~~ HOSPITAL LAB): SARS Coronavirus 2: NEGATIVE

## 2019-11-05 MED ORDER — IPRATROPIUM-ALBUTEROL 0.5-2.5 (3) MG/3ML IN SOLN
3.0000 mL | Freq: Once | RESPIRATORY_TRACT | Status: AC
  Administered 2019-11-05: 3 mL via RESPIRATORY_TRACT
  Filled 2019-11-05: qty 3

## 2019-11-05 MED ORDER — METHYLPREDNISOLONE SODIUM SUCC 125 MG IJ SOLR: 125.0000 mg | Freq: Once | INTRAMUSCULAR | Status: DC

## 2019-11-05 NOTE — ED Provider Notes (Signed)
[facility] EMERGENCY DEPARTMENT Provider Note   CSN: [id number] Arrival date & time: 11/05/19  1948     History Chief Complaint  Patient presents with  . Shortness of Breath  . Weakness    Kathleen Cameron is a 54 y.o. female hx of asthma, HTN, CKD, COPD, here presenting with shortness of breath.  Patient has history of COPD and normally on 2 L oxygen at all times.  Patient was recently in the ED and was given IV Solu-Medrol and was discharged home with steroids.  Patient states that her breathing has never gotten better.  She states that today her shortness of breath got worse.  She was noted to be in moderate respiratory distress by EMS was given Solu-Medrol and magnesium and albuterol prior to arrival.  She tested negative for Covid several days ago in the ED.  The history is provided by the patient.       Past Medical History:  Diagnosis Date  . Asthma   . Hypertension     Patient Active Problem List   Diagnosis Date Noted  . Current moderate episode of major depressive disorder without prior episode (HCC) 09/20/2019  . Pedal edema 08/04/2019  . Chronic respiratory failure with hypoxia (HCC) 03/07/2019  . CKD (chronic kidney disease) 01/05/2019  . COPD (chronic obstructive pulmonary disease) (HCC) 09/29/2018  . Pulmonary nodules 09/27/2018  . Dyslipidemia 07/07/2018  . Essential hypertension 03/10/2018    No past surgical history on file.   OB History   No obstetric history on file.     Family History  Family history unknown: Yes    Social History   Tobacco Use  . Smoking status: Never Smoker  . Smokeless tobacco: Never Used  Substance Use Topics  . Alcohol use: Yes    Alcohol/week: 2.0 standard drinks    Types: 2 Cans of beer per week  . Drug use: Never    Home Medications Prior to Admission medications   Medication Sig Start Date End Date Taking? Authorizing Provider  amLODipine (NORVASC) 5 MG tablet Take 1 tablet (5 mg  total) by mouth daily. 08/09/19 11/07/19  [name], MD  amoxicillin-clavulanate (AUGMENTIN) 875-125 MG tablet Take 1 tablet by mouth every 12 (twelve) hours. 11/03/19   Vanetta Mulders, MD  aspirin EC 81 MG EC tablet Take 1 tablet (81 mg total) by mouth daily. 04/17/19   [name], MD  bisoprolol (ZEBETA) 10 MG tablet Take 1 tablet (10 mg total) by mouth daily. 10/20/19   [name], MD  Budeson-Glycopyrrol-Formoterol (BREZTRI AEROSPHERE) 160-9-4.8 MCG/ACT AERO Inhale 2 puffs into the lungs in the morning and at bedtime. 09/27/19   Leslye Peer, MD  escitalopram (LEXAPRO) 20 MG tablet Take 1 tablet (20 mg total) by mouth daily. 10/20/19 01/18/20  [name], MD  ipratropium-albuterol (DUONEB) 0.5-2.5 (3) MG/3ML SOLN Take 3 mLs by nebulization every 6 (six) hours as needed. 05/03/19   [name], NP  lisinopril (ZESTRIL) 10 MG tablet Take 1 tablet (10 mg total) by mouth daily. 09/06/19 12/05/19  [name], MD  nitroGLYCERIN (NITROSTAT) 0.4 MG SL tablet Place 1 tablet (0.4 mg total) under the tongue every 5 (five) minutes as needed for chest pain. 04/17/19   [name], MD  pantoprazole (PROTONIX) 40 MG tablet Take 1 tablet (40 mg total) by mouth daily. 10/27/19   [name], MD  predniSONE (DELTASONE) 10 MG tablet Take 4 tablets (40 mg total) by mouth  daily. 11/03/19   Vanetta Mulders, MD  rosuvastatin (CRESTOR) 20 MG tablet Take 1 tablet (20 mg total) by mouth daily. 10/20/19 01/18/20  [name], MD    Allergies    Stiolto respimat [tiotropium bromide-olodaterol]  Review of Systems   Review of Systems  Respiratory: Positive for shortness of breath.   Neurological: Positive for weakness.  All other systems reviewed and are negative.   Physical Exam Updated Vital Signs BP (!) 147/87 (BP Location: Right Arm)   Pulse 78   Temp 97.7 F (36.5 C) (Oral)   Resp (!) 26   SpO2 99%   Physical Exam Vitals and nursing note  reviewed.  Constitutional:      Comments: tachyphneic   HENT:     Head: Normocephalic.     Mouth/Throat:     Mouth: Mucous membranes are moist.  Eyes:     Extraocular Movements: Extraocular movements intact.     Pupils: Pupils are equal, round, and reactive to light.  Cardiovascular:     Rate and Rhythm: Normal rate and regular rhythm.  Pulmonary:     Comments: Tachypneic, mild diffuse wheezing  Abdominal:     General: Bowel sounds are normal.     Palpations: Abdomen is soft.  Musculoskeletal:        General: Normal range of motion.     Cervical back: Normal range of motion and neck supple.  Skin:    General: Skin is warm.  Neurological:     General: No focal deficit present.     Mental Status: She is oriented to person, place, and time.  Psychiatric:        Mood and Affect: Mood normal.        Behavior: Behavior normal.     ED Results / Procedures / Treatments   Labs (all labs ordered are listed, but only abnormal results are displayed) Labs Reviewed  SARS CORONAVIRUS 2 BY RT PCR (HOSPITAL ORDER, PERFORMED IN [facility])  CBC WITH DIFFERENTIAL/PLATELET  COMPREHENSIVE METABOLIC PANEL  BLOOD GAS, VENOUS    EKG EKG Interpretation  Date/Time:  Saturday November 05 2019 20:52:00 EDT Ventricular Rate:  70 PR Interval:  172 QRS Duration: 72 QT Interval:  454 QTC Calculation: 490 R Axis:   89 Text Interpretation: Normal sinus rhythm Right atrial enlargement Anteroseptal infarct , age undetermined Abnormal ECG No significant change since last tracing Confirmed by Richardean Canal ([id number]) on 11/05/2019 9:21:36 PM   Radiology DG Chest Port 1 View  Result Date: 11/05/2019 CLINICAL DATA:  SOB EXAM: PORTABLE CHEST 1 VIEW COMPARISON:  Chest radiograph 11/02/2019 FINDINGS: The heart size and mediastinal contours are within normal limits. Lungs are hyperinflated. Mild streaky opacities in the bilateral mid to lower lungs. Unchanged small nodules in the right upper lung.  No new focal consolidation. No pneumothorax or large pleural effusion. No acute finding in the visualized skeleton. IMPRESSION: Mild streaky opacities in the bilateral mid to lower lungs favored to represent atelectasis, similar to prior. Electronically Signed   By: Emmaline Kluver M.D.   On: 11/05/2019 20:29    Procedures Procedures (including critical care time)  Medications Ordered in ED Medications  ipratropium-albuterol (DUONEB) 0.5-2.5 (3) MG/3ML nebulizer solution 3 mL (3 mLs Nebulization Given 11/05/19 2056)    ED Course  I have reviewed the triage vital signs and the nursing notes.  Pertinent labs & imaging results that were available during my care of the patient were reviewed by me and considered in  my medical decision making (see chart for details).    MDM Rules/Calculators/A&P                          LOUGENIA MORRISSEY is a 54 y.o. female who presented with shortness of breath.  Patient has history of COPD on 2 L nasal cannula at baseline.  She was recently in the ED and was given Solu-Medrol and sent her home with steroids. However her shortness of breath and wheezing persisted and she was in moderate distress per EMS.  Patient was given Solu-Medrol and magnesium albuterol prior to arrival.  At this point, I think patient may need admission for IV steroids.  Will get CBC and BMP and chest x-ray.  Will retest for Covid again.  10:52 PM Labs unremarkable. CO2 is 88 but PH is 7.3 so she likely has acute on chronic hypercapnia.  Oxygen is 98% on baseline 2 L.  Will admit for IV steroids for COPD exacerbation    Final Clinical Impression(s) / ED Diagnoses Final diagnoses:  None    Rx / DC Orders ED Discharge Orders    None       Charlynne Pander, MD 11/05/19 2252

## 2019-11-05 NOTE — ED Triage Notes (Addendum)
Pt. Transported [facility] from home c/o increased weakness and SOB. EMS noted SATS 91 on 2L (home dose) and accessory muscle usage. Ems administered 125mg  Solumedrol, DUONEB (0.5mg  Atrovent; 5mg  Albuterol) and 2g Magnesium. Pt. Improved. SATS 100 b/p 192/112 HR 70 Hx. COPD HTN

## 2019-11-06 ENCOUNTER — Other Ambulatory Visit (HOSPITAL_COMMUNITY): Payer: Self-pay

## 2019-11-06 ENCOUNTER — Encounter (HOSPITAL_COMMUNITY): Payer: Self-pay | Admitting: Internal Medicine

## 2019-11-06 DIAGNOSIS — Z888 Allergy status to other drugs, medicaments and biological substances status: Secondary | ICD-10-CM | POA: Diagnosis not present

## 2019-11-06 DIAGNOSIS — F32A Depression, unspecified: Secondary | ICD-10-CM

## 2019-11-06 DIAGNOSIS — I10 Essential (primary) hypertension: Secondary | ICD-10-CM

## 2019-11-06 DIAGNOSIS — J9622 Acute and chronic respiratory failure with hypercapnia: Secondary | ICD-10-CM

## 2019-11-06 DIAGNOSIS — Z9981 Dependence on supplemental oxygen: Secondary | ICD-10-CM | POA: Diagnosis not present

## 2019-11-06 DIAGNOSIS — J441 Chronic obstructive pulmonary disease with (acute) exacerbation: Secondary | ICD-10-CM | POA: Diagnosis present

## 2019-11-06 DIAGNOSIS — R911 Solitary pulmonary nodule: Secondary | ICD-10-CM | POA: Diagnosis present

## 2019-11-06 DIAGNOSIS — N1832 Chronic kidney disease, stage 3b: Secondary | ICD-10-CM | POA: Diagnosis not present

## 2019-11-06 DIAGNOSIS — Z23 Encounter for immunization: Secondary | ICD-10-CM | POA: Diagnosis not present

## 2019-11-06 DIAGNOSIS — E785 Hyperlipidemia, unspecified: Secondary | ICD-10-CM

## 2019-11-06 DIAGNOSIS — F329 Major depressive disorder, single episode, unspecified: Secondary | ICD-10-CM

## 2019-11-06 DIAGNOSIS — K219 Gastro-esophageal reflux disease without esophagitis: Secondary | ICD-10-CM | POA: Diagnosis present

## 2019-11-06 DIAGNOSIS — Z20822 Contact with and (suspected) exposure to covid-19: Secondary | ICD-10-CM | POA: Diagnosis present

## 2019-11-06 DIAGNOSIS — J9621 Acute and chronic respiratory failure with hypoxia: Secondary | ICD-10-CM

## 2019-11-06 DIAGNOSIS — F419 Anxiety disorder, unspecified: Secondary | ICD-10-CM | POA: Diagnosis present

## 2019-11-06 DIAGNOSIS — Z7982 Long term (current) use of aspirin: Secondary | ICD-10-CM | POA: Diagnosis not present

## 2019-11-06 DIAGNOSIS — Z7289 Other problems related to lifestyle: Secondary | ICD-10-CM | POA: Diagnosis not present

## 2019-11-06 DIAGNOSIS — Z79899 Other long term (current) drug therapy: Secondary | ICD-10-CM | POA: Diagnosis not present

## 2019-11-06 DIAGNOSIS — J9690 Respiratory failure, unspecified, unspecified whether with hypoxia or hypercapnia: Secondary | ICD-10-CM | POA: Diagnosis present

## 2019-11-06 LAB — HIV ANTIBODY (ROUTINE TESTING W REFLEX): HIV Screen 4th Generation wRfx: NONREACTIVE

## 2019-11-06 MED ORDER — METHYLPREDNISOLONE SODIUM SUCC 40 MG IJ SOLR
40.0000 mg | Freq: Every day | INTRAMUSCULAR | Status: DC
  Administered 2019-11-06: 40 mg via INTRAVENOUS
  Filled 2019-11-06: qty 1

## 2019-11-06 MED ORDER — ROSUVASTATIN CALCIUM 20 MG PO TABS
20.0000 mg | ORAL_TABLET | Freq: Every day | ORAL | Status: DC
  Administered 2019-11-06 – 2019-11-11 (×6): 20 mg via ORAL
  Filled 2019-11-06 (×6): qty 1

## 2019-11-06 MED ORDER — ASPIRIN EC 81 MG PO TBEC
81.0000 mg | DELAYED_RELEASE_TABLET | Freq: Every day | ORAL | Status: DC
  Administered 2019-11-06 – 2019-11-12 (×7): 81 mg via ORAL
  Filled 2019-11-06 (×7): qty 1

## 2019-11-06 MED ORDER — ESCITALOPRAM OXALATE 20 MG PO TABS
20.0000 mg | ORAL_TABLET | Freq: Every day | ORAL | Status: DC
  Administered 2019-11-06 – 2019-11-12 (×7): 20 mg via ORAL
  Filled 2019-11-06 (×5): qty 1
  Filled 2019-11-06: qty 2
  Filled 2019-11-06: qty 1

## 2019-11-06 MED ORDER — AMLODIPINE BESYLATE 5 MG PO TABS
5.0000 mg | ORAL_TABLET | Freq: Every day | ORAL | Status: DC
  Administered 2019-11-06 – 2019-11-12 (×7): 5 mg via ORAL
  Filled 2019-11-06 (×7): qty 1

## 2019-11-06 MED ORDER — ACETAMINOPHEN 500 MG PO TABS
500.0000 mg | ORAL_TABLET | Freq: Every day | ORAL | Status: DC | PRN
  Filled 2019-11-06: qty 1

## 2019-11-06 MED ORDER — ENOXAPARIN SODIUM 40 MG/0.4ML ~~LOC~~ SOLN
40.0000 mg | SUBCUTANEOUS | Status: DC
  Administered 2019-11-06 – 2019-11-12 (×7): 40 mg via SUBCUTANEOUS
  Filled 2019-11-06 (×7): qty 0.4

## 2019-11-06 MED ORDER — FLUTICASONE FUROATE-VILANTEROL 100-25 MCG/INH IN AEPB
1.0000 | INHALATION_SPRAY | Freq: Every day | RESPIRATORY_TRACT | Status: DC
  Administered 2019-11-06 – 2019-11-12 (×7): 1 via RESPIRATORY_TRACT
  Filled 2019-11-06: qty 28

## 2019-11-06 MED ORDER — HYDROCHLOROTHIAZIDE 25 MG PO TABS
25.0000 mg | ORAL_TABLET | Freq: Every day | ORAL | Status: DC
  Administered 2019-11-06 – 2019-11-12 (×7): 25 mg via ORAL
  Filled 2019-11-06 (×7): qty 1

## 2019-11-06 MED ORDER — ALUM & MAG HYDROXIDE-SIMETH 200-200-20 MG/5ML PO SUSP
15.0000 mL | Freq: Four times a day (QID) | ORAL | Status: DC | PRN
  Administered 2019-11-06 – 2019-11-08 (×3): 15 mL via ORAL
  Filled 2019-11-06 (×4): qty 30

## 2019-11-06 MED ORDER — GUAIFENESIN ER 600 MG PO TB12: 600.0000 mg | ORAL_TABLET | Freq: Every day | ORAL | Status: DC | PRN

## 2019-11-06 MED ORDER — UMECLIDINIUM BROMIDE 62.5 MCG/INH IN AEPB
1.0000 | INHALATION_SPRAY | Freq: Every day | RESPIRATORY_TRACT | Status: DC
  Administered 2019-11-06: 1 via RESPIRATORY_TRACT
  Filled 2019-11-06: qty 7

## 2019-11-06 MED ORDER — IPRATROPIUM-ALBUTEROL 0.5-2.5 (3) MG/3ML IN SOLN
3.0000 mL | Freq: Four times a day (QID) | RESPIRATORY_TRACT | Status: DC
  Administered 2019-11-06 – 2019-11-07 (×3): 3 mL via RESPIRATORY_TRACT
  Filled 2019-11-06 (×3): qty 3

## 2019-11-06 MED ORDER — METHYLPREDNISOLONE SODIUM SUCC 40 MG IJ SOLR
40.0000 mg | Freq: Two times a day (BID) | INTRAMUSCULAR | Status: DC
  Administered 2019-11-06 – 2019-11-12 (×11): 40 mg via INTRAVENOUS
  Filled 2019-11-06 (×12): qty 1

## 2019-11-06 MED ORDER — PANTOPRAZOLE SODIUM 40 MG PO TBEC
40.0000 mg | DELAYED_RELEASE_TABLET | Freq: Every day | ORAL | Status: DC
  Administered 2019-11-06 – 2019-11-12 (×7): 40 mg via ORAL
  Filled 2019-11-06 (×7): qty 1

## 2019-11-06 MED ORDER — BISOPROLOL FUMARATE 5 MG PO TABS
10.0000 mg | ORAL_TABLET | Freq: Every day | ORAL | Status: DC
  Administered 2019-11-06 – 2019-11-12 (×7): 10 mg via ORAL
  Filled 2019-11-06: qty 1
  Filled 2019-11-06 (×6): qty 2

## 2019-11-06 MED ORDER — IPRATROPIUM-ALBUTEROL 0.5-2.5 (3) MG/3ML IN SOLN: 3.0000 mL | RESPIRATORY_TRACT | Status: DC | PRN

## 2019-11-06 NOTE — ED Notes (Signed)
ED TO INPATIENT HANDOFF REPORT  ED Nurse Name and Phone #: Osborne Casco, RN  S Name/Age/Gender Kathleen Cameron 54 y.o. female Room/Bed: 039C/039C  Code Status   Code Status: Full Code  Home/SNF/Other Home Patient oriented to: self, place, time and situation Is this baseline? Yes   Triage Complete: Triage complete  Chief Complaint COPD exacerbation (HCC) [J44.1]  Triage Note Pt. Transported [facility] from home c/o increased weakness and SOB. EMS noted SATS 91 on 2L (home dose) and accessory muscle usage. Ems administered 125mg  Solumedrol, DUONEB (0.5mg  Atrovent; 5mg  Albuterol) and 2g Magnesium. Pt. Improved. SATS 100 b/p 192/112 HR 70 Hx. COPD HTN     Allergies Allergies  Allergen Reactions  . Stiolto Respimat [Tiotropium Bromide-Olodaterol] Other (See Comments)    Severe headaches and rapid heartrate    Level of Care/Admitting Diagnosis ED Disposition    ED Disposition Condition Comment   Admit  Hospital Area: [facility] [[id number]]  Level of Care: Telemetry Medical [104]  I expect the patient will be discharged within 24 hours: No (not a candidate for 5C-Observation unit)  Covid Evaluation: Asymptomatic Screening Protocol (No Symptoms)  Diagnosis: COPD exacerbation [facility]) [facility]  Admitting Physician: [facility] [[id number]]  Attending Physician: Anselm Jungling [[id number]]       B Medical/Surgery History Past Medical History:  Diagnosis Date  . Asthma   . Hypertension    No past surgical history on file.   A IV Location/Drains/Wounds Patient Lines/Drains/Airways Status    Active Line/Drains/Airways    Name Placement date Placement time Site Days   Peripheral IV 11/05/19 Left Antecubital 11/05/19  2002  Antecubital  1          Intake/Output Last 24 hours No intake or output data in the 24 hours ending 11/06/19 1405  Labs/Imaging Results for orders placed or performed during the hospital encounter of 11/05/19 (from the past 48 hour(s))  CBC  with Differential/Platelet     Status: None   Collection Time: 11/05/19  9:01 PM  Result Value Ref Range   WBC 7.3 4.0 - 10.5 K/uL   RBC 4.56 3.87 - 5.11 MIL/uL   Hemoglobin 13.1 12.0 - 15.0 g/dL   HCT 11/07/19 36 - 46 %   MCV 89.5 80.0 - 100.0 fL   MCH 28.7 26.0 - 34.0 pg   MCHC 32.1 30.0 - 36.0 g/dL   RDW 11/07/19 98.3 - 38.2 %   Platelets 246 150 - 400 K/uL   nRBC 0.0 0.0 - 0.2 %   Neutrophils Relative % 81 %   Neutro Abs 6.0 1.7 - 7.7 K/uL   Lymphocytes Relative 11 %   Lymphs Abs 0.8 0.7 - 4.0 K/uL   Monocytes Relative 3 %   Monocytes Absolute 0.2 0 - 1 K/uL   Eosinophils Relative 3 %   Eosinophils Absolute 0.2 0 - 0 K/uL   Basophils Relative 1 %   Basophils Absolute 0.0 0 - 0 K/uL   Immature Granulocytes 1 %   Abs Immature Granulocytes 0.04 0.00 - 0.07 K/uL    Comment: Performed at [facility], [address], [place], [address] [place]  Comprehensive metabolic panel     Status: Abnormal   Collection Time: 11/05/19  9:01 PM  Result Value Ref Range   Sodium 135 135 - 145 mmol/L   Potassium 3.6 3.5 - 5.1 mmol/L   Chloride 86 (L) 98 - 111 mmol/L   CO2 36 (H) 22 - 32 mmol/L  Glucose, Bld 117 (H) 70 - 99 mg/dL    Comment: Glucose reference range applies only to samples taken after fasting for at least 8 hours.   BUN 19 6 - 20 mg/dL   Creatinine, Ser 8.65 0.44 - 1.00 mg/dL   Calcium 9.4 8.9 - 78.4 mg/dL   Total Protein 7.9 6.5 - 8.1 g/dL   Albumin 4.2 3.5 - 5.0 g/dL   AST 25 15 - 41 U/L   ALT 20 0 - 44 U/L   Alkaline Phosphatase 54 38 - 126 U/L   Total Bilirubin 0.6 0.3 - 1.2 mg/dL   GFR calc non Af Amer >60 >60 mL/min   GFR calc Af Amer >60 >60 mL/min   Anion gap 13 5 - 15    Comment: Performed at [facility], [address] [place], [place] [address]  SARS Coronavirus 2 by RT PCR (hospital order, performed in [facility] hospital lab) Nasopharyngeal Nasopharyngeal Swab     Status: None   Collection Time: 11/05/19 10:07 PM   Specimen: Nasopharyngeal Swab   Result Value Ref Range   SARS Coronavirus 2 NEGATIVE NEGATIVE    Comment: (NOTE) SARS-CoV-2 target nucleic acids are NOT DETECTED.  The SARS-CoV-2 RNA is generally detectable in upper and lower respiratory specimens during the acute phase of infection. The lowest concentration of SARS-CoV-2 viral copies this assay can detect is 250 copies / mL. A negative result does not preclude SARS-CoV-2 infection and should not be used as the sole basis for treatment or other patient management decisions.  A negative result may occur with improper specimen collection / handling, submission of specimen other than nasopharyngeal swab, presence of viral mutation(s) within the areas targeted by this assay, and inadequate number of viral copies (<250 copies / mL). A negative result must be combined with clinical observations, patient history, and epidemiological information.  Fact Sheet for Patients:   BoilerBrush.com.cy  Fact Sheet for Healthcare Providers: https://pope.com/  This test is not yet approved or  cleared by the [place] FDA and has been authorized for detection and/or diagnosis of SARS-CoV-2 by FDA under an Emergency Use Authorization (EUA).  This EUA will remain in effect (meaning this test can be used) for the duration of the COVID-19 declaration under Section 564(b)(1) of the Act, 21 U.S.C. section 360bbb-3(b)(1), unless the authorization is terminated or revoked sooner.  Performed at [facility], [address] [place], [place] [address]   I-Stat venous blood gas, ED     Status: Abnormal   Collection Time: 11/05/19 10:23 PM  Result Value Ref Range   pH, Ven 7.301 7.25 - 7.43   pCO2, Ven 88.7 (HH) 44 - 60 mmHg   pO2, Ven 77.0 (H) 32 - 45 mmHg   Bicarbonate 43.7 (H) 20.0 - 28.0 mmol/L   TCO2 46 (H) 22 - 32 mmol/L   O2 Saturation 93.0 %   Acid-Base Excess 13.0 (H) 0.0 - 2.0 mmol/L   Sodium 133 (L) 135 - 145 mmol/L    Potassium 3.7 3.5 - 5.1 mmol/L   Calcium, Ion 1.18 1.15 - 1.40 mmol/L   HCT 42.0 36 - 46 %   Hemoglobin 14.3 12.0 - 15.0 g/dL   Sample type VENOUS   HIV Antibody (routine testing w rflx)     Status: None   Collection Time: 11/06/19 12:49 AM  Result Value Ref Range   HIV Screen 4th Generation wRfx Non Reactive Non Reactive    Comment: Performed at [facility], [address], [place], [place] [address]   DG Chest Port 1 View  Result Date: 11/05/2019 CLINICAL DATA:  SOB EXAM: PORTABLE CHEST 1 VIEW COMPARISON:  Chest radiograph 11/02/2019 FINDINGS: The heart size and mediastinal contours are within normal limits. Lungs are hyperinflated. Mild streaky opacities in the bilateral mid to lower lungs. Unchanged small nodules in the right upper lung. No new focal consolidation. No pneumothorax or large pleural effusion. No acute finding in the visualized skeleton. IMPRESSION: Mild streaky opacities in the bilateral mid to lower lungs favored to represent atelectasis, similar to prior. Electronically Signed   By: Emmaline Kluver M.D.   On: 11/05/2019 20:29    Pending Labs Unresulted Labs (From admission, onward) Comment          Start     Ordered   11/05/19 2012  Blood gas, venous  Once,   STAT        11/05/19 2011          Vitals/Pain Today's Vitals   11/06/19 1015 11/06/19 1110 11/06/19 1130 11/06/19 1230  BP: (!) 189/106  (!) 186/158 (!) 169/96  Pulse: 63  (!) 56 (!) 57  Resp: (!) 22  (!) 23 19  Temp:      TempSrc:      SpO2: 97%  100% 99%  Weight:      Height:      PainSc:  0-No pain      Isolation Precautions No active isolations  Medications Medications  enoxaparin (LOVENOX) injection 40 mg (40 mg Subcutaneous Given 11/06/19 0926)  methylPREDNISolone sodium succinate (SOLU-MEDROL) 40 mg/mL injection 40 mg (40 mg Intravenous Given 11/06/19 0925)  acetaminophen (TYLENOL) tablet 500 mg (has no administration in time range)  aspirin EC tablet 81 mg (81 mg Oral  Given 11/06/19 0925)  amLODipine (NORVASC) tablet 5 mg (5 mg Oral Given 11/06/19 0925)  bisoprolol (ZEBETA) tablet 10 mg (10 mg Oral Given 11/06/19 1105)  hydrochlorothiazide (HYDRODIURIL) tablet 25 mg (25 mg Oral Given 11/06/19 0925)  rosuvastatin (CRESTOR) tablet 20 mg (has no administration in time range)  escitalopram (LEXAPRO) tablet 20 mg (20 mg Oral Given 11/06/19 0925)  pantoprazole (PROTONIX) EC tablet 40 mg (40 mg Oral Given 11/06/19 0925)  guaiFENesin (MUCINEX) 12 hr tablet 600 mg (has no administration in time range)  fluticasone furoate-vilanterol (BREO ELLIPTA) 100-25 MCG/INH 1 puff (1 puff Inhalation Given 11/06/19 1107)  umeclidinium bromide (INCRUSE ELLIPTA) 62.5 MCG/INH 1 puff (1 puff Inhalation Given 11/06/19 1109)  ipratropium-albuterol (DUONEB) 0.5-2.5 (3) MG/3ML nebulizer solution 3 mL (3 mLs Nebulization Given 11/05/19 2056)    Mobility walks with device Moderate fall risk   Focused Assessments Pulmonary Assessment Handoff:  Lung sounds: Bilateral Breath Sounds: Clear, Diminished L Breath Sounds: Clear, Diminished R Breath Sounds: Clear, Diminished O2 Device: Room Air O2 Flow Rate (L/min): 2 L/min      R Recommendations: See Admitting Provider Note  Report given to:   Additional Notes:

## 2019-11-06 NOTE — H&P (Signed)
History and Physical    Kathleen SchlatterJacqueline B Ledgerwood ZOX:[id number] DOB: 12/26/1965 DOA: 11/05/2019  PCP: Georgina QuintSagardia, Miguel Jose, MD  Patient coming from: Home  I have personally briefly reviewed patient[facility]  Chief Complaint: Worsening shortness of breath  HPI: Kathleen SchlatterJacqueline B Stogner is a 54 y.o. female with medical history significant for COPD with chronic hypoxemia on 2 L, hypertension, CKD, hyperlipidemia and depression who presents with concerns of worsening shortness of breath.  She reports that for the past several weeks she has progressively gotten worsening shortness of breath both with exertion and at rest.  Has had trouble laying flat and needs to sit up to sleep.  No lower extremity edema.  She is getting to the point that she could barely breathe with talking and felt weak.  Also had decreased appetite.  Felt like food sat heavy in her stomach and made her feel constipated.  She denies any worsening cough but has been wheezing.  Denies any chest pain.  No fever.  No nausea, vomiting, or diarrhea.  No sick contact.  Has been taking her albuterol inhaler once or twice a day which has helped.  She was recently evaluated in the ED on 7/14 for COPD exacerbation and was discharged home with Augmentin and oral prednisone which she is still taking.  ED Course: She was afebrile, mildly tachypneic and hypertensive on her home 2 L of O2. No leukocytosis.  CMP otherwise unremarkable.  Chest x-ray shows mild streaky opacity in the bilateral mid to lower lungs.  Needs at this  Review of Systems:  Constitutional: No Weight Change, No Fever ENT/Mouth: No sore throat, No Rhinorrhea Eyes: No Eye Pain, No Vision Changes Cardiovascular: No Chest Pain, + SOB Respiratory: No Cough, No Sputum, + Wheezing, no Dyspnea  Gastrointestinal: No Nausea, No Vomiting, No Diarrhea, No Constipation, No Pain Genitourinary: no Urinary Incontinence Musculoskeletal: No Arthralgias,  No Myalgias Skin: No Skin Lesions, No Pruritus, Neuro: no Weakness, No Numbness Psych: No Anxiety/Panic, No Depression, no decrease appetite Heme/Lymph: No Bruising, No Bleeding  Past Medical History:  Diagnosis Date  . Asthma   . Hypertension     No past surgical history on file.   reports that she has never smoked. She has never used smokeless tobacco. She reports current alcohol use of about 2.0 standard drinks of alcohol per week. She reports that she does not use drugs.  Allergies  Allergen Reactions  . Stiolto Respimat [Tiotropium Bromide-Olodaterol] Other (See Comments)    Severe headaches and rapid heartrate    Family History  Family history unknown: Yes     Prior to Admission medications   Medication Sig Start Date End Date Taking? Authorizing Provider  acetaminophen (TYLENOL) 500 MG tablet Take 500 mg by mouth daily as needed for mild pain or headache.   Yes [provider]  amLODipine (NORVASC) 5 MG tablet Take 1 tablet (5 mg total) by mouth daily. 08/09/19 11/07/19 Yes Sagardia, Eilleen KempfMiguel Jose, MD  amoxicillin-clavulanate (AUGMENTIN) 875-125 MG tablet Take 1 tablet by mouth every 12 (twelve) hours. 11/03/19  Yes Vanetta MuldersZackowski, Scott, MD  aspirin EC 81 MG EC tablet Take 1 tablet (81 mg total) by mouth daily. 04/17/19  Yes Calvert Cantorizwan, Saima, MD  bisoprolol (ZEBETA) 10 MG tablet Take 1 tablet (10 mg total) by mouth daily. 10/20/19  Yes Sagardia, Eilleen KempfMiguel Jose, MD  Budeson-Glycopyrrol-Formoterol (BREZTRI AEROSPHERE) 160-9-4.8 MCG/ACT AERO Inhale 2 puffs into the lungs in the morning and at bedtime. 09/27/19  Yes  Leslye Peer, MD  escitalopram (LEXAPRO) 20 MG tablet Take 1 tablet (20 mg total) by mouth daily. 10/20/19 01/18/20 Yes Sagardia, Eilleen Kempf, MD  guaiFENesin (MUCINEX) 600 MG 12 hr tablet Take 600 mg by mouth daily as needed for cough or to loosen phlegm.   Yes [provider]  hydrochlorothiazide (HYDRODIURIL) 25 MG tablet Take 25 mg by mouth daily. 09/07/19  Yes  [provider]  ipratropium-albuterol (DUONEB) 0.5-2.5 (3) MG/3ML SOLN Take 3 mLs by nebulization every 6 (six) hours as needed. Patient taking differently: Take 3 mLs by nebulization every 6 (six) hours as needed (Shortness of Breath).  05/03/19  Yes [name], NP  nitroGLYCERIN (NITROSTAT) 0.4 MG SL tablet Place 1 tablet (0.4 mg total) under the tongue every 5 (five) minutes as needed for chest pain. 04/17/19  Yes [name], MD  pantoprazole (PROTONIX) 40 MG tablet Take 1 tablet (40 mg total) by mouth daily. 10/27/19  Yes Sagardia, Eilleen Kempf, MD  predniSONE (DELTASONE) 10 MG tablet Take 4 tablets (40 mg total) by mouth daily. 11/03/19  Yes Vanetta Mulders, MD  rosuvastatin (CRESTOR) 20 MG tablet Take 1 tablet (20 mg total) by mouth daily. 10/20/19 01/18/20 Yes Sagardia, Eilleen Kempf, MD  lisinopril (ZESTRIL) 10 MG tablet Take 1 tablet (10 mg total) by mouth daily. Patient not taking: Reported on 11/05/2019 09/06/19 12/05/19  [name], MD    Physical Exam: Vitals:   11/05/19 2001 11/05/19 2058 11/05/19 2145 11/05/19 2158  BP: (!) 147/87  (!) 158/122   Pulse: 78  71   Resp: (!) 26  (!) 23   Temp: 97.7 F (36.5 C)     TempSrc: Oral     SpO2: 98% 99% 98%   Weight:    51.7 kg  Height:    5\' 1"  (1.549 m)    Constitutional: NAD, calm, comfortable, thin female sitting upright in tripod position but breathing comfortably in no acute distress Vitals:   11/05/19 2001 11/05/19 2058 11/05/19 2145 11/05/19 2158  BP: (!) 147/87  (!) 158/122   Pulse: 78  71   Resp: (!) 26  (!) 23   Temp: 97.7 F (36.5 C)     TempSrc: Oral     SpO2: 98% 99% 98%   Weight:    51.7 kg  Height:    5\' 1"  (1.549 m)   Eyes: PERRL, lids and conjunctivae normal ENMT: Mucous membranes are moist.  Neck: normal, supple Respiratory: Poor and decreased aeration throughout with little air movement and no wheezing, no crackles. Normal respiratory effort on 2 L. No accessory muscle use.    Cardiovascular: Regular rate and rhythm, no murmurs / rubs / gallops. No extremity edema.   Abdomen: no tenderness, no masses palpated.  Bowel sounds positive.  Musculoskeletal: no clubbing / cyanosis. No joint deformity upper and lower extremities. Good ROM, no contractures. Normal muscle tone.  Skin: no rashes, lesions, ulcers. No induration Neurologic: CN 2-12 grossly intact. Sensation intact, Strength 5/5 in all 4.  Psychiatric: Normal judgment and insight. Alert and oriented x 3. Normal mood.     Labs on Admission: I have personally reviewed following labs and imaging studies  CBC: Recent Labs  Lab 11/02/19 2230 11/05/19 2101 11/05/19 2223  WBC 11.7* 7.3  --   NEUTROABS  --  6.0  --   HGB 13.3 13.1 14.3  HCT 43.1 40.8 42.0  MCV 93.3 89.5  --   PLT 262 246  --  Basic Metabolic Panel: Recent Labs  Lab 11/02/19 2230 11/05/19 2101 11/05/19 2223  NA 140 135 133*  K 3.6 3.6 3.7  CL 91* 86*  --   CO2 37* 36*  --   GLUCOSE 169* 117*  --   BUN 17 19  --   CREATININE 0.82 0.71  --   CALCIUM 9.6 9.4  --    GFR: Estimated Creatinine Clearance: 59.3 mL/min (by C-G formula based on SCr of 0.71 mg/dL). Liver Function Tests: Recent Labs  Lab 11/05/19 2101  AST 25  ALT 20  ALKPHOS 54  BILITOT 0.6  PROT 7.9  ALBUMIN 4.2   No results for input(s): LIPASE, AMYLASE in the last 168 hours. No results for input(s): AMMONIA in the last 168 hours. Coagulation Profile: No results for input(s): INR, PROTIME in the last 168 hours. Cardiac Enzymes: No results for input(s): CKTOTAL, CKMB, CKMBINDEX, TROPONINI in the last 168 hours. BNP (last 3 results) No results for input(s): PROBNP in the last 8760 hours. HbA1C: No results for input(s): HGBA1C in the last 72 hours. CBG: No results for input(s): GLUCAP in the last 168 hours. Lipid Profile: No results for input(s): CHOL, HDL, LDLCALC, TRIG, CHOLHDL, LDLDIRECT in the last 72 hours. Thyroid Function Tests: No results for  input(s): TSH, T4TOTAL, FREET4, T3FREE, THYROIDAB in the last 72 hours. Anemia Panel: No results for input(s): VITAMINB12, FOLATE, FERRITIN, TIBC, IRON, RETICCTPCT in the last 72 hours. Urine analysis:    Component Value Date/Time   COLORURINE YELLOW 11/12/2008 1440   APPEARANCEUR CLEAR 11/12/2008 1440   LABSPEC 1.016 11/12/2008 1440   PHURINE 7.5 11/12/2008 1440   GLUCOSEU NEGATIVE 11/12/2008 1440   HGBUR TRACE (A) 11/12/2008 1440   BILIRUBINUR NEGATIVE 11/12/2008 1440   KETONESUR NEGATIVE 11/12/2008 1440   PROTEINUR NEGATIVE 11/12/2008 1440   UROBILINOGEN 1.0 11/12/2008 1440   NITRITE NEGATIVE 11/12/2008 1440   LEUKOCYTESUR SMALL (A) 11/12/2008 1440    Radiological Exams on Admission: DG Chest Port 1 View  Result Date: 11/05/2019 CLINICAL DATA:  SOB EXAM: PORTABLE CHEST 1 VIEW COMPARISON:  Chest radiograph 11/02/2019 FINDINGS: The heart size and mediastinal contours are within normal limits. Lungs are hyperinflated. Mild streaky opacities in the bilateral mid to lower lungs. Unchanged small nodules in the right upper lung. No new focal consolidation. No pneumothorax or large pleural effusion. No acute finding in the visualized skeleton. IMPRESSION: Mild streaky opacities in the bilateral mid to lower lungs favored to represent atelectasis, similar to prior. Electronically Signed   By: Emmaline Kluver M.D.   On: 11/05/2019 20:29      Assessment/Plan  COPD exacerbation Continue home 2 L of O2 Scheduled DuoNeb Continue IV Solu-Medrol No increased cough or sputum to warrant antibiotics  Hypertension Continue amlodipine, bisoprolol, HCTZ  Chronic kidney disease Creatinine stable  Hyperlipidemia Continue statin  Depression Continue Lexapro  DVT prophylaxis:.Lovenox Code Status: Full Family Communication: Plan discussed with patient at bedside  disposition Plan: Home with observation Consults called:  Admission status: Observation  Status is: Observation  The  patient remains OBS appropriate and will d/c before 2 midnights.  Dispo: The patient is from: Home              Anticipated d/c is to: Home              Anticipated d/c date is: 1 day              Patient currently is not medically stable to d/c.  Anselm Jungling DO [facility]   If 7PM-7AM, please contact night-coverage [email or web address]   11/06/2019, 12:23 AM

## 2019-11-06 NOTE — Progress Notes (Signed)
PROGRESS NOTE    Kathleen Cameron  KPT:[id number] DOB: [birth year] DOA: 11/05/2019 PCP: [name], MD    Brief Narrative:  Patient admitted to the hospital with a working diagnosis of COPD exacerbation.  54 year old female with past medical history for COPD, chronic hypoxic respiratory failure, hypertension and dyslipidemia.  She reported several weeks of dyspnea, progressive symptoms to the point where she became dyspneic with minimal activities.  Positive wheezing and decreased p.o. intake.  She was seen in the ED July 14, diagnosed with COPD exacerbation, prescribed Augmentin and prednisone. On her initial physical examination blood pressure 147/87, heart rate 78, respiratory 26, temperature 97.7, oxygen saturation 99% on supplemental oxygen.  Her lungs have decreased breath sounds bilaterally, poor air movement, heart S1-S2, present rhythmic, abdomen soft, no lower extremity edema. Sodium 135, potassium 3.6, chloride 86, bicarb 36, glucose 117, BUN 19, creatinine 0.71, white count 7.3, hemoglobin 13.1, hematocrit 40.9, platelets 246.  SARS COVID-19 negative.  Venous blood gas pH 7.30, PCO2 80, PO2 77, bicarb 46.  Chest radiograph with hyperinflation. EKG 69 bpm, normal axis, normal intervals, QTc manually corrected 483, sinus rhythm, poor R wave progression, no ST segment or T wave changes.  Assessment & Plan:   Principal Problem:   Acute on chronic respiratory failure with hypoxia and hypercapnia (HCC) Active Problems:   Essential hypertension   Dyslipidemia   COPD exacerbation (HCC)   Depression   1. Acute on chronic hypoxic and hypercapnic respiratory failure due to COPD exacerbation. Present on admission. Patient continue to have significant dyspnea, not yet back to baseline. Her oxygenation is 94% on 2L/min per [place].   Continue aggressive bronchodilator therapy, airway clearing techniques with flutter valve and incentive spirometer. Systemic steroids and inhaled  corticosteroids/ LABA. Will add short course of azithromycin 500 mg daily for 5 days.   2. HTN. Uncontrolled blood pressure, 163/100 mmHg, will continue with amlodipine, hctz and bisoprolol.   3. Dyslipidemia. Continue with statin therapy.   4. Depression. No confusion or agitation. Continue with escitalopram.   Patient's calculated GFR is more than 190, ruled out chronic kidney disease,    Patient continue to be at high risk for worsening respiratory failure  Status is: Observation  The patient will require care spanning > 2 midnights and should be moved to inpatient because: IV treatments appropriate due to intensity of illness or inability to take PO. Patient continue to have significant symptoms, she has failed outpatient therapy 07/14 with oral antibiotics and systemic steroids.   Dispo: The patient is from: Home              Anticipated d/c is to: Home              Anticipated d/c date is: 2 days              Patient currently is not medically stable to d/c.    DVT prophylaxis: Enoxaparin   Code Status:   full  Family Communication:  No family at the bedside       Subjective: Patient continue to have dyspnea, not yet back to baseline, no nausea or vomiting no chest pain. Continue to be very weak and deconditioned.   Objective: Vitals:   11/06/19 0915 11/06/19 1015 11/06/19 1130 11/06/19 1230  BP: (!) 171/95 (!) 189/106 (!) 186/158 (!) 169/96  Pulse: (!) 26 63 (!) 56 (!) 57  Resp: 20 (!) 22 (!) 23 19  Temp:      TempSrc:  SpO2: 98% 97% 100% 99%  Weight:      Height:       No intake or output data in the 24 hours ending 11/06/19 1408 Filed Weights   11/05/19 2158  Weight: 51.7 kg    Examination:   General: deconditioned and ill looking appearing  Neurology: Awake and alert, non focal  E ENT: mild pallor, no icterus, oral mucosa moist Cardiovascular: No JVD. S1-S2 present, rhythmic, no gallops, rubs, or murmurs. No lower extremity edema. Pulmonary:  decreased breath sounds bilaterally, decreased air movement, scattered expiratory wheezing, rhonchi and rales. Gastrointestinal. Abdomen with no organomegaly, non tender, no rebound or guarding Skin. No rashes Musculoskeletal: no joint deformities     Data Reviewed: I have personally reviewed following labs and imaging studies  CBC: Recent Labs  Lab 11/02/19 2230 11/05/19 2101 11/05/19 2223  WBC 11.7* 7.3  --   NEUTROABS  --  6.0  --   HGB 13.3 13.1 14.3  HCT 43.1 40.8 42.0  MCV 93.3 89.5  --   PLT 262 246  --    Basic Metabolic Panel: Recent Labs  Lab 11/02/19 2230 11/05/19 2101 11/05/19 2223  NA 140 135 133*  K 3.6 3.6 3.7  CL 91* 86*  --   CO2 37* 36*  --   GLUCOSE 169* 117*  --   BUN 17 19  --   CREATININE 0.82 0.71  --   CALCIUM 9.6 9.4  --    GFR: Estimated Creatinine Clearance: 59.3 mL/min (by C-G formula based on SCr of 0.71 mg/dL). Liver Function Tests: Recent Labs  Lab 11/05/19 2101  AST 25  ALT 20  ALKPHOS 54  BILITOT 0.6  PROT 7.9  ALBUMIN 4.2   No results for input(s): LIPASE, AMYLASE in the last 168 hours. No results for input(s): AMMONIA in the last 168 hours. Coagulation Profile: No results for input(s): INR, PROTIME in the last 168 hours. Cardiac Enzymes: No results for input(s): CKTOTAL, CKMB, CKMBINDEX, TROPONINI in the last 168 hours. BNP (last 3 results) No results for input(s): PROBNP in the last 8760 hours. HbA1C: No results for input(s): HGBA1C in the last 72 hours. CBG: No results for input(s): GLUCAP in the last 168 hours. Lipid Profile: No results for input(s): CHOL, HDL, LDLCALC, TRIG, CHOLHDL, LDLDIRECT in the last 72 hours. Thyroid Function Tests: No results for input(s): TSH, T4TOTAL, FREET4, T3FREE, THYROIDAB in the last 72 hours. Anemia Panel: No results for input(s): VITAMINB12, FOLATE, FERRITIN, TIBC, IRON, RETICCTPCT in the last 72 hours.    Radiology Studies: I have reviewed all of the imaging during this  hospital visit personally     Scheduled Meds: . amLODipine  5 mg Oral Daily  . aspirin EC  81 mg Oral Daily  . bisoprolol  10 mg Oral Daily  . enoxaparin (LOVENOX) injection  40 mg Subcutaneous Q24H  . escitalopram  20 mg Oral Daily  . fluticasone furoate-vilanterol  1 puff Inhalation Daily  . hydrochlorothiazide  25 mg Oral Daily  . methylPREDNISolone (SOLU-MEDROL) injection  40 mg Intravenous Daily  . pantoprazole  40 mg Oral Daily  . rosuvastatin  20 mg Oral q1800  . umeclidinium bromide  1 puff Inhalation Daily   Continuous Infusions:   LOS: 0 days        Josalynn Johndrow Annett Gula, MD

## 2019-11-06 NOTE — ED Notes (Signed)
PT ambulated with strong gait HR peaked at 69, O2 did not drop below 95

## 2019-11-07 ENCOUNTER — Telehealth: Payer: Self-pay | Admitting: *Deleted

## 2019-11-07 DIAGNOSIS — J9601 Acute respiratory failure with hypoxia: Secondary | ICD-10-CM

## 2019-11-07 DIAGNOSIS — J9602 Acute respiratory failure with hypercapnia: Secondary | ICD-10-CM

## 2019-11-07 LAB — BASIC METABOLIC PANEL
Anion gap: 9 (ref 5–15)
BUN: 28 mg/dL — ABNORMAL HIGH (ref 6–20)
CO2: 40 mmol/L — ABNORMAL HIGH (ref 22–32)
Calcium: 9.5 mg/dL (ref 8.9–10.3)
Chloride: 86 mmol/L — ABNORMAL LOW (ref 98–111)
Creatinine, Ser: 0.84 mg/dL (ref 0.44–1.00)
GFR calc Af Amer: >60 mL/min (ref >60)
GFR calc non Af Amer: >60 mL/min (ref >60)
Glucose, Bld: 112 mg/dL — ABNORMAL HIGH (ref 70–99)
Potassium: 4.7 mmol/L (ref 3.5–5.1)
Sodium: 135 mmol/L (ref 135–145)

## 2019-11-07 MED ORDER — GUAIFENESIN-DM 100-10 MG/5ML PO SYRP: 5.0000 mL | ORAL_SOLUTION | ORAL | Status: DC | PRN

## 2019-11-07 MED ORDER — IPRATROPIUM-ALBUTEROL 0.5-2.5 (3) MG/3ML IN SOLN
3.0000 mL | Freq: Two times a day (BID) | RESPIRATORY_TRACT | Status: DC
  Administered 2019-11-07 – 2019-11-09 (×4): 3 mL via RESPIRATORY_TRACT
  Filled 2019-11-07 (×4): qty 3

## 2019-11-07 NOTE — Progress Notes (Signed)
PROGRESS NOTE    Kathleen Cameron  YCX:[id number] DOB: 30-Dec-1965 DOA: 11/05/2019 PCP: [name], MD    Brief Narrative:  Patient admitted to the hospital with a working diagnosis of COPD exacerbation, complicated with acute on chronic hypoxic and hypercapnic respiratory failure.  54 year old female with past medical history for COPD, chronic hypoxic respiratory failure, hypertension and dyslipidemia.  She reported several weeks of dyspnea, progressive symptoms to the point where she became dyspneic with minimal activities.  Positive wheezing and decreased p.o. intake.  She was seen in the ED July 14, diagnosed with COPD exacerbation, prescribed Augmentin and prednisone. On her initial physical examination blood pressure 147/87, heart rate 78, respiratory 26, temperature 97.7, oxygen saturation 99% on supplemental oxygen.  Her lungs have decreased breath sounds bilaterally, poor air movement, heart S1-S2, present rhythmic, abdomen soft, no lower extremity edema. Sodium 135, potassium 3.6, chloride 86, bicarb 36, glucose 117, BUN 19, creatinine 0.71, white count 7.3, hemoglobin 13.1, hematocrit 40.9, platelets 246.  SARS COVID-19 negative.  Venous blood gas pH 7.30, PCO2 80, PO2 77, bicarb 46.  Chest radiograph with hyperinflation. EKG 69 bpm, normal axis, normal intervals, QTc manually corrected 483, sinus rhythm, poor R wave progression, no ST segment or T wave changes.  Patient placed on systemic corticosteroids and aggressive bronchodilator therapy.   Assessment & Plan:   Principal Problem:   Acute on chronic respiratory failure with hypoxia and hypercapnia (HCC) Active Problems:   Essential hypertension   Dyslipidemia   COPD exacerbation (HCC)   Depression   Respiratory failure (HCC)   1. Acute on chronic hypoxic and hypercapnic respiratory failure due to COPD exacerbation. Present on admission. Patient with persistent dyspnea worse with exertion associated with  poor appetite and generalized weakness. Today her oxygenation is 100 % on 2L/min per [place].   Medical therapy with aggressive bronchodilator therapy, airway clearing techniques with flutter valve - incentive spirometer.  Continue with systemic steroids (methylprednisolone 40 mg IV bid)  and inhaled corticosteroids plus LABA. Antitussive agents with guaifenesin with DM as needed.   Continue with oral azithromycin 500 mg #2/5.   2. HTN. Blood pressure control with amlodipine, hctz and bisoprolol. Today blood pressure 140/97 mmHg/   3. Dyslipidemia. On rosuvastatin.   4. Depression. On escitalopram.   Patient's calculated GFR is more than 190, ruled out chronic kidney disease,    Status is: Inpatient  Remains inpatient appropriate because:IV treatments appropriate due to intensity of illness or inability to take PO   Dispo: The patient is from: Home              Anticipated d/c is to: Home              Anticipated d/c date is: 2 days              Patient currently is not medically stable to d/c.   DVT prophylaxis: Enoxaparin   Code Status:   full  Family Communication:  No family at the bedside      Subjective: Patient continue to have dyspnea at rest and on exertion, generalized weakness and poor appetite.   Objective: Vitals:   11/06/19 2155 11/07/19 0745 11/07/19 1000 11/07/19 1005  BP: (!) 184/106  (!) 140/97 (!) 140/97  Pulse: 62  68 68  Resp: 18  20 19   Temp: 98 F (36.7 C)  97.7 F (36.5 C) 97.7 F (36.5 C)  TempSrc: Oral   Oral  SpO2: 100% 100%  100%  Weight:  Height:       No intake or output data in the 24 hours ending 11/07/19 1151 Filed Weights   11/05/19 2158  Weight: 51.7 kg    Examination:   General: deconditioned and ill looking appearing  Neurology: Awake and alert, non focal  E ENT: positive pallor, no icterus, oral mucosa moist Cardiovascular: No JVD. S1-S2 present, rhythmic, no gallops, rubs, or murmurs. No lower extremity  edema. Pulmonary: positive breath sounds bilaterally, decreased air movement, no wheezing, scattered bilateral rales. No rhonchi Gastrointestinal. Abdomen soft with no organomegaly, non tender, no rebound or guarding Skin. No rashes Musculoskeletal: no joint deformities     Data Reviewed: I have personally reviewed following labs and imaging studies  CBC: Recent Labs  Lab 11/02/19 2230 11/05/19 2101 11/05/19 2223  WBC 11.7* 7.3  --   NEUTROABS  --  6.0  --   HGB 13.3 13.1 14.3  HCT 43.1 40.8 42.0  MCV 93.3 89.5  --   PLT 262 246  --    Basic Metabolic Panel: Recent Labs  Lab 11/02/19 2230 11/05/19 2101 11/05/19 2223 11/07/19 0546  NA 140 135 133* 135  K 3.6 3.6 3.7 4.7  CL 91* 86*  --  86*  CO2 37* 36*  --  40*  GLUCOSE 169* 117*  --  112*  BUN 17 19  --  28*  CREATININE 0.82 0.71  --  0.84  CALCIUM 9.6 9.4  --  9.5   GFR: Estimated Creatinine Clearance: 56.4 mL/min (by C-G formula based on SCr of 0.84 mg/dL). Liver Function Tests: Recent Labs  Lab 11/05/19 2101  AST 25  ALT 20  ALKPHOS 54  BILITOT 0.6  PROT 7.9  ALBUMIN 4.2   No results for input(s): LIPASE, AMYLASE in the last 168 hours. No results for input(s): AMMONIA in the last 168 hours. Coagulation Profile: No results for input(s): INR, PROTIME in the last 168 hours. Cardiac Enzymes: No results for input(s): CKTOTAL, CKMB, CKMBINDEX, TROPONINI in the last 168 hours. BNP (last 3 results) No results for input(s): PROBNP in the last 8760 hours. HbA1C: No results for input(s): HGBA1C in the last 72 hours. CBG: No results for input(s): GLUCAP in the last 168 hours. Lipid Profile: No results for input(s): CHOL, HDL, LDLCALC, TRIG, CHOLHDL, LDLDIRECT in the last 72 hours. Thyroid Function Tests: No results for input(s): TSH, T4TOTAL, FREET4, T3FREE, THYROIDAB in the last 72 hours. Anemia Panel: No results for input(s): VITAMINB12, FOLATE, FERRITIN, TIBC, IRON, RETICCTPCT in the last 72  hours.    Radiology Studies: I have reviewed all of the imaging during this hospital visit personally     Scheduled Meds: . amLODipine  5 mg Oral Daily  . aspirin EC  81 mg Oral Daily  . bisoprolol  10 mg Oral Daily  . enoxaparin (LOVENOX) injection  40 mg Subcutaneous Q24H  . escitalopram  20 mg Oral Daily  . fluticasone furoate-vilanterol  1 puff Inhalation Daily  . hydrochlorothiazide  25 mg Oral Daily  . ipratropium-albuterol  3 mL Nebulization BID  . methylPREDNISolone (SOLU-MEDROL) injection  40 mg Intravenous Q12H  . pantoprazole  40 mg Oral Daily  . rosuvastatin  20 mg Oral q1800   Continuous Infusions:   LOS: 1 day        Braydee Shimkus Annett Gula, MD

## 2019-11-07 NOTE — Telephone Encounter (Signed)
On 11/03/2019, patient was called to pick completed disability forms at check in when she is available. Patient stated she will come on the next day, Friday.

## 2019-11-07 NOTE — Telephone Encounter (Signed)
Spoke to patient on 11/03/2019, paper work is ready for pick at check in. On 11/07/2019, I checked the pick up folder and it was not there.

## 2019-11-07 NOTE — Evaluation (Signed)
Physical Therapy Evaluation Patient Details Name: Kathleen Cameron MRN: [id number] DOB: 01/27/1966 Today's Date: 11/07/2019   History of Present Illness  54 yo female c/o increasing SOB. Recent ED visit on 7/14 for COPD exacerbation. Admitted for recurrent COPD exacerbation. PMH asthma and HTN  Clinical Impression   Patient received in bed, willing to participate in PT; see below for mobility/assist levels. Able to get OOB and mobilize approximately 20ft in room with Mod(I) to min guard without assistive device, mildly unsteady but able to maintain balance without external support and grossly weak. SpO2 with one time drop to 89% on 2LPM but recovered to 92% with pursed lip breathing and otherwise remained >94% for remainder of session on 2LPM per standard [place]. Discussed mechanics of COPD/breathing difficulties as well as PLB HEP (10 breaths 3-5x/day or when she is having a hard time breathing), also HEP for use of IS (10 breaths on IS/hour). Left sitting up in recliner with all needs met, RN aware of patient status.     Follow Up Recommendations Home health PT    Equipment Recommendations  3in1 (PT)    Recommendations for Other Services       Precautions / Restrictions Precautions Precautions: Other (comment);Fall Precaution Comments: watch O2/HR Restrictions Weight Bearing Restrictions: No      Mobility  Bed Mobility Overal bed mobility: Independent                Transfers Overall transfer level: Independent Equipment used: None             General transfer comment: sit to stand without difficulty, good safety awareness and hand placement  Ambulation/Gait Ambulation/Gait assistance: Min guard Gait Distance (Feet): 20 Feet Assistive device: 1 person hand held assist Gait Pattern/deviations: Step-through pattern;Trendelenburg;Trunk flexed;Narrow base of support Gait velocity: decreased   General Gait Details: very mildly unsteady with HHA (usually uses  rollator) but able to maintain balance with light min guard and no device  Stairs            Wheelchair Mobility    Modified Rankin (Stroke Patients Only)       Balance Overall balance assessment: Mild deficits observed, not formally tested                                           Pertinent Vitals/Pain Pain Assessment: No/denies pain    Home Living Family/patient expects to be discharged to:: Private residence Living Arrangements: Non-relatives/Friends (friends and son are there 24/7 via shifts) Available Help at Discharge: Family;Friend(s);Available 24 hours/day Type of Home: Apartment Home Access: Stairs to enter (elevator but it is broken) Entrance Stairs-Rails: Right Entrance Stairs-Number of Steps: 1 flight Home Layout: One level Home Equipment: Grab bars - tub/shower;Walker - 4 wheels Additional Comments: in the past two weeks has gotten VERY weak and deconditioned    Prior Function Level of Independence: Needs assistance   Gait / Transfers Assistance Needed: uses rollator and can get aroujnd with a little extra time, short distances  ADL's / Homemaking Assistance Needed: takes her qujite awhile and she gets very winded        Hand Dominance   Dominant Hand: Right    Extremity/Trunk Assessment   Upper Extremity Assessment Upper Extremity Assessment: Overall WFL for tasks assessed (but grossly weak)    Lower Extremity Assessment Lower Extremity Assessment: Overall WFL for tasks assessed (but grossly weak)      Cervical / Trunk Assessment Cervical / Trunk Assessment: Normal  Communication   Communication: No difficulties  Cognition Arousal/Alertness: Awake/alert Behavior During Therapy: WFL for tasks assessed/performed Overall Cognitive Status: Within Functional Limits for tasks assessed                                        General Comments General comments (skin integrity, edema, etc.): SPO2 dropped briefly  to 89% after supine to sit but recovered nicely and maintained >94% for rest of session; swapped out [place] from HFNC to standard since she is only on 2LPM    Exercises     Assessment/Plan    PT Assessment Patient needs continued PT services  PT Problem List Decreased strength;Decreased activity tolerance;Decreased balance;Decreased mobility;Decreased coordination;Cardiopulmonary status limiting activity       PT Treatment Interventions DME instruction;Balance training;Gait training;Stair training;Functional mobility training;Patient/family education;Therapeutic activities;Therapeutic exercise    PT Goals (Current goals can be found in the Care Plan section)  Acute Rehab PT Goals Patient Stated Goal: breathe better PT Goal Formulation: With patient Time For Goal Achievement: 11/21/19 Potential to Achieve Goals: Good    Frequency Min 3X/week   Barriers to discharge        Co-evaluation               AM-PAC PT "6 Clicks" Mobility  Outcome Measure Help needed turning from your back to your side while in a flat bed without using bedrails?: None Help needed moving from lying on your back to sitting on the side of a flat bed without using bedrails?: None Help needed moving to and from a bed to a chair (including a wheelchair)?: A Little Help needed standing up from a chair using your arms (e.g., wheelchair or bedside chair)?: A Little Help needed to walk in hospital room?: A Little Help needed climbing 3-5 steps with a railing? : A Little 6 Click Score: 20    End of Session Equipment Utilized During Treatment: Oxygen Activity Tolerance: Patient tolerated treatment well Patient left: in chair;with call bell/phone within reach Nurse Communication: Mobility status PT Visit Diagnosis: Unsteadiness on feet (R26.81);Muscle weakness (generalized) (M62.81)    Time: 9381-8299 PT Time Calculation (min) (ACUTE ONLY): 31 min   Charges:   PT Evaluation $PT Eval Moderate Complexity:  1 Mod PT Treatments $Gait Training: 8-22 mins        Windell Norfolk, DPT, PN1   Supplemental Physical Therapist [place]    Pager [phone] Acute Rehab Office [phone]

## 2019-11-07 NOTE — Telephone Encounter (Signed)
Spoke to patient on Thursday, paper work is ready for pick up and ask at front desk.

## 2019-11-07 NOTE — Evaluation (Signed)
Occupational Therapy Evaluation Patient Details Name: Kathleen Cameron MRN: [id number] DOB: 10/11/1965 Today's Date: 11/07/2019    History of Present Illness 54 yo female c/o increasing SOB. Recent ED visit on 7/14 for COPD exacerbation. Admitted for recurrent COPD exacerbation. PMH asthma and HTN   Clinical Impression   PTA, pt was living at home with her friend, whom pt refers to as her husband since they have been together since her daughter was 2y.o., pt reports her husband assists with ADL/IADL and functional mobility at rollator level. Pt is on 2lnc at all times at baseline. Pt endorses stopping preferred ADL/IADL and leisure activities due to decreased endurance. Pt states she would "love to cook again". Pt currently demonstrates decreased stability, decreased activity tolerance and increased DoE impacting her independence with ADL. She requires supervision for functional mobility with ADL completion. Due to decline in current level of function, pt would benefit from acute OT to address ECS, stability, activity tolerance for increased independence with ADL/IADL and functional mobility to facilitate safe D/C to venue listed below. At this time, recommend HHOT follow-up. Will continue to follow acutely.     Follow Up Recommendations  Home health OT    Equipment Recommendations  None recommended by OT    Recommendations for Other Services       Precautions / Restrictions Precautions Precautions: Other (comment);Fall Precaution Comments: watch O2/HR Restrictions Weight Bearing Restrictions: No      Mobility Bed Mobility Overal bed mobility: Independent             General bed mobility comments: sitting in recliner upon arrival  Transfers Overall transfer level: Needs assistance Equipment used: None Transfers: Sit to/from Stand;Stand Pivot Transfers Sit to Stand: Supervision Stand pivot transfers: Supervision       General transfer comment: supervision for  safety    Balance Overall balance assessment: Mild deficits observed, not formally tested                                         ADL either performed or assessed with clinical judgement   ADL Overall ADL's : Needs assistance/impaired Eating/Feeding: Set up;Sitting   Grooming: Supervision/safety;Standing   Upper Body Bathing: Modified independent   Lower Body Bathing: Modified independent   Upper Body Dressing : Modified independent   Lower Body Dressing: Modified independent   Toilet Transfer: Supervision/safety;Ambulation Toilet Transfer Details (indicate cue type and reason): onto [facility] Toileting- Clothing Manipulation and Hygiene: Supervision/safety;Sit to/from stand Toileting - Clothing Manipulation Details (indicate cue type and reason): pt urinated on [facility] supervision for sit<>stand     Functional mobility during ADLs: Supervision/safety General ADL Comments: pt limited by decreased activity tolerance, educated pt on energy conservation strategies with provided handout     Vision Baseline Vision/History: Wears glasses Wears Glasses: Reading only Patient Visual Report: No change from baseline Vision Assessment?: No apparent visual deficits Additional Comments: pt able to read ECS handout     Perception     Praxis      Pertinent Vitals/Pain Pain Assessment: No/denies pain     Hand Dominance Right   Extremity/Trunk Assessment Upper Extremity Assessment Upper Extremity Assessment: Overall WFL for tasks assessed   Lower Extremity Assessment Lower Extremity Assessment: Overall WFL for tasks assessed   Cervical / Trunk Assessment Cervical / Trunk Assessment: Normal   Communication Communication Communication: No difficulties   Cognition Arousal/Alertness: Awake/alert Behavior During Therapy:  WFL for tasks assessed/performed Overall Cognitive Status: Within Functional Limits for tasks assessed                                  General Comments: good awareness of safety   General Comments  SpO2 maintained >94% throughout session, pt on 2lnc    Exercises     Shoulder Instructions      Home Living Family/patient expects to be discharged to:: Private residence Living Arrangements: Non-relatives/Friends (friends and son are there 24/7 via shifts) Available Help at Discharge: Family;Friend(s);Available 24 hours/day Type of Home: Apartment Home Access: Stairs to enter Advertising account executive but it is broken) Secretary/administrator of Steps: 1 flight Entrance Stairs-Rails: Right Home Layout: One level     Bathroom Shower/Tub: Chief Strategy Officer: Standard Bathroom Accessibility: Yes   Home Equipment: Grab bars - tub/shower;Walker - 4 wheels   Additional Comments: in the past two weeks has gotten VERY weak and deconditioned      Prior Functioning/Environment Level of Independence: Needs assistance  Gait / Transfers Assistance Needed: uses rollator and can get aroujnd with a little extra time, short distances ADL's / Homemaking Assistance Needed: pt reports her husband assists her with all ADL;pt does not drive, her 'husband' takes her   Comments: pt reports she and her friend have been together since her daughter was 2y.o so she refers to him as her husband;        OT Problem List: Decreased activity tolerance;Cardiopulmonary status limiting activity      OT Treatment/Interventions: Self-care/ADL training;Therapeutic exercise;Energy conservation;DME and/or AE instruction;Therapeutic activities;Patient/family education;Balance training    OT Goals(Current goals can be found in the care plan section) Acute Rehab OT Goals Patient Stated Goal: to get back to cooking OT Goal Formulation: With patient Time For Goal Achievement: 11/21/19 Potential to Achieve Goals: Good ADL Goals Pt Will Perform Grooming: with modified independence;standing Pt Will Transfer to Toilet: with modified  independence;ambulating Additional ADL Goal #1: Pt will demonstrate independence with 3 energy conservation strategies during ADL/IADL and functional mobility.  OT Frequency: Min 2X/week   Barriers to D/C:            Co-evaluation              AM-PAC OT "6 Clicks" Daily Activity     Outcome Measure Help from another person eating meals?: None Help from another person taking care of personal grooming?: A Little Help from another person toileting, which includes using toliet, bedpan, or urinal?: A Little Help from another person bathing (including washing, rinsing, drying)?: A Little Help from another person to put on and taking off regular upper body clothing?: A Little Help from another person to put on and taking off regular lower body clothing?: A Little 6 Click Score: 19   End of Session Equipment Utilized During Treatment: Gait belt;Oxygen Nurse Communication: Mobility status  Activity Tolerance: Patient tolerated treatment well Patient left: in chair;with call bell/phone within reach  OT Visit Diagnosis: Other abnormalities of gait and mobility (R26.89)                Time: 3474-2595 OT Time Calculation (min): 26 min Charges:  OT General Charges $OT Visit: 1 Visit OT Evaluation $OT Eval Moderate Complexity: 1 Mod OT Treatments $Self Care/Home Management : 8-22 mins  Rosey Bath OTR/L Acute Rehabilitation Services Office: [phone]   Rebeca Alert 11/07/2019, 4:21 PM

## 2019-11-08 LAB — BASIC METABOLIC PANEL
Anion gap: 8 (ref 5–15)
BUN: 35 mg/dL — ABNORMAL HIGH (ref 6–20)
CO2: 39 mmol/L — ABNORMAL HIGH (ref 22–32)
Calcium: 9.4 mg/dL (ref 8.9–10.3)
Chloride: 86 mmol/L — ABNORMAL LOW (ref 98–111)
Creatinine, Ser: 0.88 mg/dL (ref 0.44–1.00)
GFR calc Af Amer: >60 mL/min (ref >60)
GFR calc non Af Amer: >60 mL/min (ref >60)
Glucose, Bld: 118 mg/dL — ABNORMAL HIGH (ref 70–99)
Potassium: 4.5 mmol/L (ref 3.5–5.1)
Sodium: 133 mmol/L — ABNORMAL LOW (ref 135–145)

## 2019-11-08 MED ORDER — PNEUMOCOCCAL VAC POLYVALENT 25 MCG/0.5ML IJ INJ
0.5000 mL | INJECTION | INTRAMUSCULAR | Status: AC
  Administered 2019-11-09: 0.5 mL via INTRAMUSCULAR
  Filled 2019-11-08: qty 0.5

## 2019-11-08 MED ORDER — SUCRALFATE 1 GM/10ML PO SUSP
1.0000 g | Freq: Three times a day (TID) | ORAL | Status: DC
  Administered 2019-11-08 – 2019-11-12 (×15): 1 g via ORAL
  Filled 2019-11-08 (×15): qty 10

## 2019-11-08 NOTE — Progress Notes (Signed)
PROGRESS NOTE    Kathleen Cameron  QIH:[id number] DOB: 06-21-1965 DOA: 11/05/2019 PCP: [name], MD    Brief Narrative:  Patient admitted to the hospital with a working diagnosis of COPD exacerbation, complicated with acute on chronic hypoxic and hypercapnic respiratory failure.  54 year old female with past medical history for COPD, chronic hypoxic respiratory failure, hypertensionanddyslipidemia. She reported several weeks of dyspnea, progressive symptoms to the point where she becamedyspneic with minimal activities. Positive wheezing and decreased p.o. intake. She was seen in the ED July 14, diagnosed with COPD exacerbation, prescribed Augmentin and prednisone. On her initial physical examination blood pressure 147/87, heart rate 78, respiratory 26, temperature 97.7, oxygen saturation 99% on supplemental oxygen. Her lungs have decreased breath sounds bilaterally, poor air movement, heart S1-S2, present rhythmic, abdomen soft, no lower extremity edema. Sodium 135, potassium 3.6, chloride 86, bicarb 36, glucose 117, BUN 19, creatinine 0.71, white count 7.3, hemoglobin 13.1, hematocrit 40.9, platelets 246. SARS COVID-19 negative. Venous blood gas pH 7.30, PCO2 80, PO2 77, bicarb 46. Chest radiograph with hyperinflation. EKG 69 bpm, normal axis, normal intervals, QTc manually corrected 483, sinus rhythm, poor R wave progression, no ST segment or T wave changes.  Patient placed on systemic corticosteroids and aggressive bronchodilator therapy   Assessment & Plan:   Principal Problem:   Acute on chronic respiratory failure with hypoxia and hypercapnia (HCC) Active Problems:   Essential hypertension   Dyslipidemia   COPD exacerbation (HCC)   Depression   Respiratory failure (HCC)    1. Acute on chronic hypoxic and hypercapnic respiratory failure due to COPD exacerbation. Present on admission. Continue to have persistent dyspnea. Her oxygenation is 99 % on  2L/min per . Continue to have significant signs of air trapping on lung auscultation.   Continue with aggressive bronchodilator therapy, airway clearing techniques with flutter valve - incentive spirometer.   On systemic steroids (methylprednisolone 40 mg IV bid)  and inhaled corticosteroids plus LABA. Continue with atitussive agents.  Azithromycin 500 mg #3/5, for airway inflammation.   2. HTN. Continue with amlodipine, hctz and bisoprolol, for blood pressure control.  3. Dyslipidemia.  Continue with rosuvastatin.   4. Depression. Continue with escitalopram.   5. GERD. Patient with abdominal pain, radiated to the chest, worse with meals. Will continue antiacid therapy with pantoprazole and will add sucralfate tid with meals.   Patient's calculated GFR is more than 190, ruled out chronic kidney disease,his renal function has remained stable will check blood work as needed.     Status is: Inpatient  Remains inpatient appropriate because:IV treatments appropriate due to intensity of illness or inability to take PO   Dispo: The patient is from: Home              Anticipated d/c is to: Home              Anticipated d/c date is: 3 days              Patient currently is not medically stable to d/c.    DVT prophylaxis: Enoxaparin   Code Status:   full  Family Communication:  No family at the bedside         Antimicrobials:   Azithromycin     Subjective: Patient with persistent dyspnea, improved but not yet back to baseline. Positive GERD and reflux, no nausea or vomiting, very weak and deconditioned   Objective: Vitals:   11/07/19 2250 11/08/19 0725 11/08/19 0728 11/08/19 0928  BP: (!) 158/96  [name])  176/110 135/77  Pulse: (!) 59  (!) 57 60  Resp:   20 20  Temp: 97.8 F (36.6 C)   97.7 F (36.5 C)  TempSrc:    Oral  SpO2: 98% 97% 100% 99%  Weight:      Height:        Intake/Output Summary (Last 24 hours) at 11/08/2019 1232 Last data filed at 11/07/2019  1952 Gross per 24 hour  Intake 240 ml  Output --  Net 240 ml   Filed Weights   11/05/19 2158  Weight: 51.7 kg    Examination:   General: deconditioned  Neurology: Awake and alert, non focal  E VVZ:SMOL pallor, no icterus, oral mucosa moist Cardiovascular: No JVD. S1-S2 present, rhythmic, no gallops, rubs, or murmurs. No lower extremity edema. Pulmonary: positive breath sounds bilaterally, decreased air movement, no wheezing, or rhonchi scattered bilateral rales. Gastrointestinal. Abdomen soft and non tender Skin. No rashes Musculoskeletal: no joint deformities     Data Reviewed: I have personally reviewed following labs and imaging studies  CBC: Recent Labs  Lab 11/02/19 2230 11/05/19 2101 11/05/19 2223  WBC 11.7* 7.3  --   NEUTROABS  --  6.0  --   HGB 13.3 13.1 14.3  HCT 43.1 40.8 42.0  MCV 93.3 89.5  --   PLT 262 246  --    Basic Metabolic Panel: Recent Labs  Lab 11/02/19 2230 11/05/19 2101 11/05/19 2223 11/07/19 0546 11/08/19 0300  NA 140 135 133* 135 133*  K 3.6 3.6 3.7 4.7 4.5  CL 91* 86*  --  86* 86*  CO2 37* 36*  --  40* 39*  GLUCOSE 169* 117*  --  112* 118*  BUN 17 19  --  28* 35*  CREATININE 0.82 0.71  --  0.84 0.88  CALCIUM 9.6 9.4  --  9.5 9.4   GFR: Estimated Creatinine Clearance: 53.9 mL/min (by C-G formula based on SCr of 0.88 mg/dL). Liver Function Tests: Recent Labs  Lab 11/05/19 2101  AST 25  ALT 20  ALKPHOS 54  BILITOT 0.6  PROT 7.9  ALBUMIN 4.2   No results for input(s): LIPASE, AMYLASE in the last 168 hours. No results for input(s): AMMONIA in the last 168 hours. Coagulation Profile: No results for input(s): INR, PROTIME in the last 168 hours. Cardiac Enzymes: No results for input(s): CKTOTAL, CKMB, CKMBINDEX, TROPONINI in the last 168 hours. BNP (last 3 results) No results for input(s): PROBNP in the last 8760 hours. HbA1C: No results for input(s): HGBA1C in the last 72 hours. CBG: No results for input(s): GLUCAP in  the last 168 hours. Lipid Profile: No results for input(s): CHOL, HDL, LDLCALC, TRIG, CHOLHDL, LDLDIRECT in the last 72 hours. Thyroid Function Tests: No results for input(s): TSH, T4TOTAL, FREET4, T3FREE, THYROIDAB in the last 72 hours. Anemia Panel: No results for input(s): VITAMINB12, FOLATE, FERRITIN, TIBC, IRON, RETICCTPCT in the last 72 hours.    Radiology Studies: I have reviewed all of the imaging during this hospital visit personally     Scheduled Meds: . amLODipine  5 mg Oral Daily  . aspirin EC  81 mg Oral Daily  . bisoprolol  10 mg Oral Daily  . enoxaparin (LOVENOX) injection  40 mg Subcutaneous Q24H  . escitalopram  20 mg Oral Daily  . fluticasone furoate-vilanterol  1 puff Inhalation Daily  . hydrochlorothiazide  25 mg Oral Daily  . ipratropium-albuterol  3 mL Nebulization BID  . methylPREDNISolone (SOLU-MEDROL) injection  40 mg Intravenous  Q12H  . pantoprazole  40 mg Oral Daily  . [START ON 11/09/2019] pneumococcal 23 valent vaccine  0.5 mL Intramuscular Tomorrow-1000  . rosuvastatin  20 mg Oral q1800   Continuous Infusions:   LOS: 2 days        Clancy Mullarkey Annett Gula, MD

## 2019-11-08 NOTE — Plan of Care (Signed)
  Problem: Clinical Measurements: Goal: Will remain free from infection Outcome: Progressing Goal: Diagnostic test results will improve Outcome: Progressing Goal: Respiratory complications will improve Outcome: Progressing Goal: Cardiovascular complication will be avoided Outcome: Progressing   Problem: Activity: Goal: Risk for activity intolerance will decrease Outcome: Progressing   Problem: Nutrition: Goal: Adequate nutrition will be maintained Outcome: Progressing   Problem: Pain Managment: Goal: General experience of comfort will improve Outcome: Progressing   Problem: Safety: Goal: Ability to remain free from injury will improve Outcome: Progressing   Problem: Skin Integrity: Goal: Risk for impaired skin integrity will decrease Outcome: Progressing

## 2019-11-08 NOTE — Progress Notes (Signed)
Occupational Therapy Treatment Patient Details Name: Kathleen Cameron MRN: [id number] DOB: 09-19-1965 Today's Date: 11/08/2019    History of present illness 54 yo female c/o increasing SOB. Recent ED visit on 7/14 for COPD exacerbation. Admitted for recurrent COPD exacerbation. PMH asthma and HTN   OT comments  Pt demonstrating good understanding and carryover of energy conservation strategies. Session focused on identifying valued occupations and strategies to implement to maximize successful completion of these tasks. Pt with reports of heart burn and fatigue from the day's earlier activities, so further assessment and progression of ADLs planned for next session. Per staff and pt, pt has been mobilizing and toileting self without assistance today.   Follow Up Recommendations  Home health OT    Equipment Recommendations  None recommended by OT    Recommendations for Other Services      Precautions / Restrictions Precautions Precautions: Other (comment);Fall Precaution Comments: watch O2/HR Restrictions Weight Bearing Restrictions: No       Mobility Bed Mobility                  Transfers                      Balance                                           ADL either performed or assessed with clinical judgement   ADL Overall ADL's : Needs assistance/impaired                                       General ADL Comments: Per pt and RN, pt was mobilizing in room this AM without assistance. Pt was completing sponge bathing task, became fatigued, and pt's significant other assisted pt.     Vision       Perception     Praxis      Cognition Arousal/Alertness: Awake/alert Behavior During Therapy: WFL for tasks assessed/performed Overall Cognitive Status: Within Functional Limits for tasks assessed                                          Exercises     Shoulder Instructions        General Comments Pt declined OOB activities due to being active this AM and fatigued now. Pt also declined activities secondary to heart burn. Time spent problem solving pt's home setup, valued occupations and routines, and reinforcing energy conservation strategies with pt demo good understanding of these strategies    Pertinent Vitals/ Pain       Pain Assessment: Faces Faces Pain Scale: Hurts a little bit Pain Location: chest, indigestion Pain Descriptors / Indicators: Nagging;Discomfort;Grimacing Pain Intervention(s): Limited activity within patient's tolerance;Monitored during session;Other (comment) (notified RN)  Home Living                                          Prior Functioning/Environment              Frequency  Min 2X/week        Progress Toward Goals  OT Goals(current  goals can now be found in the care plan section)  Progress towards OT goals: Progressing toward goals  Acute Rehab OT Goals Patient Stated Goal: to get back to cooking OT Goal Formulation: With patient Time For Goal Achievement: 11/21/19 Potential to Achieve Goals: Good ADL Goals Pt Will Perform Grooming: with modified independence;standing Pt Will Transfer to Toilet: with modified independence;ambulating Additional ADL Goal #1: Pt will demonstrate independence with 3 energy conservation strategies during ADL/IADL and functional mobility.  Plan Discharge plan remains appropriate    Co-evaluation                 AM-PAC OT "6 Clicks" Daily Activity     Outcome Measure   Help from another person eating meals?: None Help from another person taking care of personal grooming?: A Little Help from another person toileting, which includes using toliet, bedpan, or urinal?: A Little Help from another person bathing (including washing, rinsing, drying)?: A Little Help from another person to put on and taking off regular upper body clothing?: None Help from another person  to put on and taking off regular lower body clothing?: A Little 6 Click Score: 20    End of Session Equipment Utilized During Treatment: Oxygen  OT Visit Diagnosis: Other abnormalities of gait and mobility (R26.89)   Activity Tolerance Patient tolerated treatment well   Patient Left in bed;with call bell/phone within reach   Nurse Communication Other (comment) (heart burn)        Time: 9323-5573 OT Time Calculation (min): 20 min  Charges: OT General Charges $OT Visit: 1 Visit OT Treatments $Therapeutic Activity: 8-22 mins  Lorre Munroe, OTR/L   Lorre Munroe 11/08/2019, 2:38 PM

## 2019-11-09 ENCOUNTER — Inpatient Hospital Stay (HOSPITAL_COMMUNITY): Payer: Commercial Managed Care - PPO

## 2019-11-09 DIAGNOSIS — J9622 Acute and chronic respiratory failure with hypercapnia: Secondary | ICD-10-CM

## 2019-11-09 DIAGNOSIS — J9621 Acute and chronic respiratory failure with hypoxia: Secondary | ICD-10-CM

## 2019-11-09 LAB — BLOOD GAS, ARTERIAL
Acid-Base Excess: 15 mmol/L — ABNORMAL HIGH (ref 0.0–2.0)
Bicarbonate: 41.7 mmol/L — ABNORMAL HIGH (ref 20.0–28.0)
Drawn by: 414221
FIO2: 52
O2 Saturation: 98.4 %
Patient temperature: 36.5
pCO2 arterial: 82.4 mmHg (ref 32.0–48.0)
pH, Arterial: 7.321 — ABNORMAL LOW (ref 7.350–7.450)
pO2, Arterial: 124 mmHg — ABNORMAL HIGH (ref 83.0–108.0)

## 2019-11-09 MED ORDER — BUDESONIDE 0.25 MG/2ML IN SUSP
0.2500 mg | Freq: Two times a day (BID) | RESPIRATORY_TRACT | Status: DC
  Administered 2019-11-09 – 2019-11-11 (×4): 0.25 mg via RESPIRATORY_TRACT
  Filled 2019-11-09 (×4): qty 2

## 2019-11-09 MED ORDER — ALBUTEROL SULFATE HFA 108 (90 BASE) MCG/ACT IN AERS: 2.0000 | INHALATION_SPRAY | RESPIRATORY_TRACT | Status: DC | PRN

## 2019-11-09 MED ORDER — IPRATROPIUM-ALBUTEROL 0.5-2.5 (3) MG/3ML IN SOLN
3.0000 mL | Freq: Four times a day (QID) | RESPIRATORY_TRACT | Status: DC
  Administered 2019-11-09 (×2): 3 mL via RESPIRATORY_TRACT
  Filled 2019-11-09 (×2): qty 3

## 2019-11-09 MED ORDER — AZITHROMYCIN 250 MG PO TABS
500.0000 mg | ORAL_TABLET | Freq: Every day | ORAL | Status: AC
  Administered 2019-11-09 – 2019-11-10 (×2): 500 mg via ORAL
  Filled 2019-11-09 (×2): qty 2

## 2019-11-09 MED ORDER — IPRATROPIUM-ALBUTEROL 0.5-2.5 (3) MG/3ML IN SOLN
3.0000 mL | Freq: Two times a day (BID) | RESPIRATORY_TRACT | Status: DC
  Administered 2019-11-10 (×2): 3 mL via RESPIRATORY_TRACT
  Filled 2019-11-09 (×2): qty 3

## 2019-11-09 MED ORDER — ALBUTEROL SULFATE (2.5 MG/3ML) 0.083% IN NEBU: 2.5000 mg | INHALATION_SOLUTION | RESPIRATORY_TRACT | Status: DC | PRN

## 2019-11-09 NOTE — Progress Notes (Signed)
PROGRESS NOTE    Kathleen Cameron  HYQ:[id number] DOB: 07/09/1965 DOA: 11/05/2019 PCP: [name], MD   Brief Narrative:  54 year old female with past medical history for COPD, chronic hypoxic respiratory failure, hypertensionanddyslipidemia. She reported several weeks of dyspnea, progressive symptoms to the point where she becamedyspneic with minimal activities. Positive wheezing and decreased p.o. intake. She was seen in the ED July 14, diagnosed with COPD exacerbation, prescribed Augmentin and prednisone. On her initial physical examination blood pressure 147/87, heart rate 78, respiratory 26, temperature 97.7, oxygen saturation 99% on supplemental oxygen. Her lungs have decreased breath sounds bilaterally, poor air movement, heart S1-S2, present rhythmic, abdomen soft, no lower extremity edema. Sodium 135, potassium 3.6, chloride 86, bicarb 36, glucose 117, BUN 19, creatinine 0.71, white count 7.3, hemoglobin 13.1, hematocrit 40.9, platelets 246. SARS COVID-19 negative. Venous blood gas pH 7.30, PCO2 80, PO2 77, bicarb 46. Chest radiograph with hyperinflation. EKG 69 bpm, normal axis, normal intervals, QTc manually corrected 483, sinus rhythm, poor R wave progression, no ST segment or T wave changes.  Patient placed on systemic corticosteroids and aggressive bronchodilator therapy   Assessment & Plan:   Principal Problem:   Acute on chronic respiratory failure with hypoxia and hypercapnia (HCC) Active Problems:   Essential hypertension   Dyslipidemia   COPD exacerbation (HCC)   Depression   Respiratory failure (HCC)   1. Acute on chronic hypoxic and hypercapnic respiratory failure due to COPD exacerbation. Present on admission. Continue to have persistentdyspnea.  -Normally wears 2 L nasal cannula oxygen at home and is currently on 2.5 L nasal cannula oxygen.  Continue withaggressive bronchodilator therapy, airway clearing techniques with flutter valve  -incentive spirometer.  On systemic steroids(methylprednisolone 40 mg IV bid)and inhaled corticosteroids  plusLABA. Continue with antitussive agents. -Started Pulmicort twice daily 7/21 -DuoNebs with increased frequency of every 6 hours from every 2 hours starting 7/21  Azithromycin 500 mg#4/5, for airway inflammation.   2. HTN.Continue withamlodipine, hctz and bisoprolol, for blood pressure control.  3. Dyslipidemia. Continue with rosuvastatin.  4. Depression.Continue with escitalopram.   5. GERD. Patient with abdominal pain, radiated to the chest, worse with meals. Will continue antiacid therapy with pantoprazole and will add sucralfate tid with meals.   Patient's calculated GFR is more than 190, ruled out chronic kidney disease,his renal function has remained stable will check blood work as needed.    DVT prophylaxis: Lovenox Code Status: Full code Family Communication: Husband, Earvin Hansen at bedside Disposition Plan:   Status is: Inpatient  Remains inpatient appropriate because:Ongoing diagnostic testing needed not appropriate for outpatient work up, IV treatments appropriate due to intensity of illness or inability to take PO and Inpatient level of care appropriate due to severity of illness   Dispo: The patient is from: Home              Anticipated d/c is to: Home              Anticipated d/c date is: 2 days              Patient currently is not medically stable to d/c.  Consultants:   None  Procedures:   See below  Antimicrobials:  Anti-infectives (From admission, onward)   Start     Dose/Rate Route Frequency Ordered Stop   11/09/19 1000  azithromycin (ZITHROMAX) tablet 500 mg     Discontinue     500 mg Oral Daily 11/09/19 0941 11/11/19 0959      Subjective: Patient seen  and evaluated today with ongoing cough and chest congestion with wheezing.  She has noted her baseline.  She continues to have some indigestion and is not able to tolerate  her meals very well.  Objective: Vitals:   11/08/19 1947 11/08/19 2327 11/09/19 0745 11/09/19 0903  BP:  (!) 156/91  (!) 147/91  Pulse:  62  (!) 59  Resp:    20  Temp:  97.9 F (36.6 C)  97.6 F (36.4 C)  TempSrc:      SpO2: 94% 97% 97% 98%  Weight:      Height:       No intake or output data in the 24 hours ending 11/09/19 0938 Filed Weights   11/05/19 2158  Weight: 51.7 kg    Examination:  General exam: Appears calm and comfortable  Respiratory system: Significant air trapping bilaterally with diminished breath sounds.  Minimal wheezing in the upper airways.  Currently on 2.5 L nasal cannula oxygen.  Chronically wears 2 L nasal cannula oxygen. Cardiovascular system: S1 & S2 heard, RRR. No JVD, murmurs, rubs, gallops or clicks. No pedal edema. Gastrointestinal system: Abdomen is nondistended, soft and nontender. No organomegaly or masses felt. Normal bowel sounds heard. Central nervous system: Alert and oriented. No focal neurological deficits. Extremities: Symmetric 5 x 5 power. Skin: No rashes, lesions or ulcers Psychiatry: Judgement and insight appear normal. Mood & affect appropriate.     Data Reviewed: I have personally reviewed following labs and imaging studies  CBC: Recent Labs  Lab 11/02/19 2230 11/05/19 2101 11/05/19 2223  WBC 11.7* 7.3  --   NEUTROABS  --  6.0  --   HGB 13.3 13.1 14.3  HCT 43.1 40.8 42.0  MCV 93.3 89.5  --   PLT 262 246  --    Basic Metabolic Panel: Recent Labs  Lab 11/02/19 2230 11/05/19 2101 11/05/19 2223 11/07/19 0546 11/08/19 0300  NA 140 135 133* 135 133*  K 3.6 3.6 3.7 4.7 4.5  CL 91* 86*  --  86* 86*  CO2 37* 36*  --  40* 39*  GLUCOSE 169* 117*  --  112* 118*  BUN 17 19  --  28* 35*  CREATININE 0.82 0.71  --  0.84 0.88  CALCIUM 9.6 9.4  --  9.5 9.4   GFR: Estimated Creatinine Clearance: 53.9 mL/min (by C-G formula based on SCr of 0.88 mg/dL). Liver Function Tests: Recent Labs  Lab 11/05/19 2101  AST 25  ALT  20  ALKPHOS 54  BILITOT 0.6  PROT 7.9  ALBUMIN 4.2   No results for input(s): LIPASE, AMYLASE in the last 168 hours. No results for input(s): AMMONIA in the last 168 hours. Coagulation Profile: No results for input(s): INR, PROTIME in the last 168 hours. Cardiac Enzymes: No results for input(s): CKTOTAL, CKMB, CKMBINDEX, TROPONINI in the last 168 hours. BNP (last 3 results) No results for input(s): PROBNP in the last 8760 hours. HbA1C: No results for input(s): HGBA1C in the last 72 hours. CBG: No results for input(s): GLUCAP in the last 168 hours. Lipid Profile: No results for input(s): CHOL, HDL, LDLCALC, TRIG, CHOLHDL, LDLDIRECT in the last 72 hours. Thyroid Function Tests: No results for input(s): TSH, T4TOTAL, FREET4, T3FREE, THYROIDAB in the last 72 hours. Anemia Panel: No results for input(s): VITAMINB12, FOLATE, FERRITIN, TIBC, IRON, RETICCTPCT in the last 72 hours. Sepsis Labs: No results for input(s): PROCALCITON, LATICACIDVEN in the last 168 hours.  Recent Results (from the past 240 hour(s))  SARS  Coronavirus 2 by RT PCR (hospital order, [facility]) Nasopharyngeal Nasopharyngeal Swab     Status: None   Collection Time: 11/02/19 11:31 PM   Specimen: Nasopharyngeal Swab  Result Value Ref Range Status   SARS Coronavirus 2 NEGATIVE NEGATIVE Final    Comment: (NOTE) SARS-CoV-2 target nucleic acids are NOT DETECTED.  The SARS-CoV-2 RNA is generally detectable in upper and lower respiratory specimens during the acute phase of infection. The lowest concentration of SARS-CoV-2 viral copies this assay can detect is 250 copies / mL. A negative result does not preclude SARS-CoV-2 infection and should not be used as the sole basis for treatment or other patient management decisions.  A negative result may occur with improper specimen collection / handling, submission of specimen other than nasopharyngeal swab, presence of viral mutation(s) within  the areas targeted by this assay, and inadequate number of viral copies (<250 copies / mL). A negative result must be combined with clinical observations, patient history, and epidemiological information.  Fact Sheet for Patients:   BoilerBrush.com.cyhttps://www.fda.gov/media/136312/download  Fact Sheet for Healthcare Providers: https://pope.com/https://www.fda.gov/media/136313/download  This test is not yet approved or  cleared by the [place] FDA and has been authorized for detection and/or diagnosis of SARS-CoV-2 by FDA under an Emergency Use Authorization (EUA).  This EUA will remain in effect (meaning this test can be used) for the duration of the COVID-19 declaration under Section 564(b)(1) of the Act, 21 U.S.C. section 360bbb-3(b)(1), unless the authorization is terminated or revoked sooner.  Performed at [facility], [address] [place], [place] [address]   SARS Coronavirus 2 by RT PCR (hospital order, performed in [facility] hospital lab) Nasopharyngeal Nasopharyngeal Swab     Status: None   Collection Time: 11/05/19 10:07 PM   Specimen: Nasopharyngeal Swab  Result Value Ref Range Status   SARS Coronavirus 2 NEGATIVE NEGATIVE Final    Comment: (NOTE) SARS-CoV-2 target nucleic acids are NOT DETECTED.  The SARS-CoV-2 RNA is generally detectable in upper and lower respiratory specimens during the acute phase of infection. The lowest concentration of SARS-CoV-2 viral copies this assay can detect is 250 copies / mL. A negative result does not preclude SARS-CoV-2 infection and should not be used as the sole basis for treatment or other patient management decisions.  A negative result may occur with improper specimen collection / handling, submission of specimen other than nasopharyngeal swab, presence of viral mutation(s) within the areas targeted by this assay, and inadequate number of viral copies (<250 copies / mL). A negative result must be combined with  clinical observations, patient history, and epidemiological information.  Fact Sheet for Patients:   BoilerBrush.com.cyhttps://www.fda.gov/media/136312/download  Fact Sheet for Healthcare Providers: https://pope.com/https://www.fda.gov/media/136313/download  This test is not yet approved or  cleared by the [place] FDA and has been authorized for detection and/or diagnosis of SARS-CoV-2 by FDA under an Emergency Use Authorization (EUA).  This EUA will remain in effect (meaning this test can be used) for the duration of the COVID-19 declaration under Section 564(b)(1) of the Act, 21 U.S.C. section 360bbb-3(b)(1), unless the authorization is terminated or revoked sooner.  Performed at [facility], [address] [place], [place] [address]          Radiology Studies: No results found.      Scheduled Meds:  amLODipine  5 mg Oral Daily   aspirin EC  81 mg Oral Daily   bisoprolol  10 mg Oral Daily   budesonide (PULMICORT)  nebulizer solution  0.25 mg Nebulization BID   enoxaparin (LOVENOX) injection  40 mg Subcutaneous Q24H   escitalopram  20 mg Oral Daily   fluticasone furoate-vilanterol  1 puff Inhalation Daily   hydrochlorothiazide  25 mg Oral Daily   ipratropium-albuterol  3 mL Nebulization Q6H   methylPREDNISolone (SOLU-MEDROL) injection  40 mg Intravenous Q12H   pantoprazole  40 mg Oral Daily   rosuvastatin  20 mg Oral q1800   sucralfate  1 g Oral TID WC & HS    LOS: 3 days    Time spent: 35 minutes    Lateefa Crosby Hoover Brunette, DO [facility]  If 7PM-7AM, please contact night-coverage [email or web address] 11/09/2019, 9:38 AM

## 2019-11-09 NOTE — Progress Notes (Signed)
Called by RN that pt with SOB and elevated blood pressure. Pt with asthma attack and anxious.  RT is seeing. Albuterol Neb provided.  BP was 217/135 when very anxious. O2 sat 91%. Pt on O2 by n/c and was briefly increased to 8 L/min. Now back to baseline. ABG essentially unchanged from admit. PCO2 chronically elevated. Pt is alert and oriented x3.  CXR obtained. Shows COPD that is unchanged from admission. No infiltrate or consolidation.   BP manually is now 160/100. Pt had antihypertensives this am. Continue to monitor and will have BP rechecked in one hour.  Pt is hemodynamically stable.

## 2019-11-09 NOTE — Progress Notes (Signed)
Physical Therapy Treatment Patient Details Name: Kathleen Cameron MRN: [id number] DOB: 17-Mar-1966 Today's Date: 11/09/2019    History of Present Illness 54 yo female c/o increasing SOB. Recent ED visit on 7/14 for COPD exacerbation. Admitted for recurrent COPD exacerbation. PMH asthma and HTN    PT Comments    Patient received in recliner, pleasant and willing to participate in session; able to progress mobility significantly today, although she continues to experience 6/10 shortness of breath and 10/10 exertion with progression of gait distance. Unable to get accurate signal on pulse ox but patient able to return to baseline level of exertion and not short of breath after a few minutes of seated rest. Reports she will actually be moving to somewhere without steps hopefully ready when she discharges. Left sitting up in chair with all needs met this morning. Progressing well.     Follow Up Recommendations  Home health PT     Equipment Recommendations  3in1 (PT)    Recommendations for Other Services       Precautions / Restrictions Precautions Precautions: Other (comment);Fall Precaution Comments: watch O2/HR Restrictions Weight Bearing Restrictions: No    Mobility  Bed Mobility               General bed mobility comments: OOB in chair  Transfers Overall transfer level: Needs assistance Equipment used: None Transfers: Sit to/from Stand Sit to Stand: Min guard         General transfer comment: min guard for safety  Ambulation/Gait Ambulation/Gait assistance: Min guard Gait Distance (Feet): 50 Feet Assistive device: None Gait Pattern/deviations: Step-through pattern;Trunk flexed Gait velocity: decreased   General Gait Details: min guard provided for safety but no physical assist given, more stable than eval so definitely improving   Stairs             Wheelchair Mobility    Modified Rankin (Stroke Patients Only)       Balance Overall  balance assessment: Mild deficits observed, not formally tested                                          Cognition Arousal/Alertness: Awake/alert Behavior During Therapy: WFL for tasks assessed/performed Overall Cognitive Status: Within Functional Limits for tasks assessed                                 General Comments: good awareness of safety      Exercises      General Comments General comments (skin integrity, edema, etc.): sats 94% HR 52 at rest, unable to get good signal on pulse ox at EOS or during mobility so RPE 10/10, SOB 6/10 while walking 1/10 at rest 8/10 at worst on 3LPM O2      Pertinent Vitals/Pain Pain Assessment: No/denies pain Faces Pain Scale: No hurt    Home Living                      Prior Function            PT Goals (current goals can now be found in the care plan section) Acute Rehab PT Goals Patient Stated Goal: to get back to cooking PT Goal Formulation: With patient Time For Goal Achievement: 11/21/19 Potential to Achieve Goals: Good Progress towards PT goals: Progressing toward goals  Frequency    Min 3X/week      PT Plan Current plan remains appropriate    Co-evaluation              AM-PAC PT "6 Clicks" Mobility   Outcome Measure  Help needed turning from your back to your side while in a flat bed without using bedrails?: None Help needed moving from lying on your back to sitting on the side of a flat bed without using bedrails?: None Help needed moving to and from a bed to a chair (including a wheelchair)?: None Help needed standing up from a chair using your arms (e.g., wheelchair or bedside chair)?: A Little Help needed to walk in hospital room?: A Little Help needed climbing 3-5 steps with a railing? : A Little 6 Click Score: 21    End of Session Equipment Utilized During Treatment: Oxygen;Gait belt Activity Tolerance: Patient tolerated treatment well Patient left: in  chair;with call bell/phone within reach   PT Visit Diagnosis: Unsteadiness on feet (R26.81);Muscle weakness (generalized) (M62.81)     Time: 7998-7215 PT Time Calculation (min) (ACUTE ONLY): 14 min  Charges:  $Gait Training: 8-22 mins                     Windell Norfolk, DPT, PN1   Supplemental Physical Therapist [employer]    Pager [phone] Acute Rehab Office [phone]

## 2019-11-10 LAB — BASIC METABOLIC PANEL
Anion gap: 9 (ref 5–15)
BUN: 31 mg/dL — ABNORMAL HIGH (ref 6–20)
CO2: 40 mmol/L — ABNORMAL HIGH (ref 22–32)
Calcium: 9.3 mg/dL (ref 8.9–10.3)
Chloride: 85 mmol/L — ABNORMAL LOW (ref 98–111)
Creatinine, Ser: 0.96 mg/dL (ref 0.44–1.00)
GFR calc Af Amer: >60 mL/min (ref >60)
GFR calc non Af Amer: >60 mL/min (ref >60)
Glucose, Bld: 133 mg/dL — ABNORMAL HIGH (ref 70–99)
Potassium: 4.4 mmol/L (ref 3.5–5.1)
Sodium: 134 mmol/L — ABNORMAL LOW (ref 135–145)

## 2019-11-10 LAB — CBC
HCT: 37 % (ref 36.0–46.0)
Hemoglobin: 11.9 g/dL — ABNORMAL LOW (ref 12.0–15.0)
MCH: 29.1 pg (ref 26.0–34.0)
MCHC: 32.2 g/dL (ref 30.0–36.0)
MCV: 90.5 fL (ref 80.0–100.0)
Platelets: 256 10*3/uL (ref 150–400)
RBC: 4.09 MIL/uL (ref 3.87–5.11)
RDW: 13.1 % (ref 11.5–15.5)
WBC: 7.2 10*3/uL (ref 4.0–10.5)
nRBC: 0 % (ref 0.0–0.2)

## 2019-11-10 MED ORDER — ALPRAZOLAM 0.25 MG PO TABS
0.2500 mg | ORAL_TABLET | Freq: Three times a day (TID) | ORAL | Status: DC | PRN
  Administered 2019-11-10 – 2019-11-11 (×3): 0.25 mg via ORAL
  Filled 2019-11-10 (×3): qty 1

## 2019-11-10 NOTE — Progress Notes (Signed)
PROGRESS NOTE    Kathleen Cameron  ZOX:[id number] DOB: 08/08/1965 DOA: 11/05/2019 PCP: Georgina QuintSagardia, Miguel Jose, MD   Brief Narrative:  54 year old female with past medical history for COPD, chronic hypoxic respiratory failure, hypertensionanddyslipidemia. She reported several weeks of dyspnea, progressive symptoms to the point where she becamedyspneic with minimal activities. Positive wheezing and decreased p.o. intake. She was seen in the ED July 14, diagnosed with COPD exacerbation, prescribed Augmentin and prednisone. On her initial physical examination blood pressure 147/87, heart rate 78, respiratory 26, temperature 97.7, oxygen saturation 99% on supplemental oxygen. Her lungs have decreased breath sounds bilaterally, poor air movement, heart S1-S2, present rhythmic, abdomen soft, no lower extremity edema. Sodium 135, potassium 3.6, chloride 86, bicarb 36, glucose 117, BUN 19, creatinine 0.71, white count 7.3, hemoglobin 13.1, hematocrit 40.9, platelets 246. SARS COVID-19 negative. Venous blood gas pH 7.30, PCO2 80, PO2 77, bicarb 46. Chest radiograph with hyperinflation. EKG 69 bpm, normal axis, normal intervals, QTc manually corrected 483, sinus rhythm, poor R wave progression, no ST segment or T wave changes.  Patient placed on systemic corticosteroids and aggressive bronchodilator therapy   Assessment & Plan:   Principal Problem:   Acute on chronic respiratory failure with hypoxia and hypercapnia (HCC) Active Problems:   Essential hypertension   Dyslipidemia   COPD exacerbation (HCC)   Depression   Respiratory failure (HCC)   1. Acute on chronic hypoxic and hypercapnic respiratory failure due to COPD exacerbation. Present on admission.Continue to havepersistentdyspnea with acute episode overnight.  -Normally wears 2 L nasal cannula oxygen at home and is currently on 5 L nasal cannula oxygen.  Continue withaggressive bronchodilator therapy, airway clearing  techniques with flutter valve -incentive spirometer. Onsystemic steroids(methylprednisolone 40 mg IV bid)and inhaled corticosteroids  plusLABA.Continue with antitussive agents. -Started Pulmicort twice daily 7/21 -DuoNebs every 2 hours. -Xanax added as needed for anxiety  Azithromycin 500 mg#5/5, for airway inflammation.DC by tomorrow.  2. HTN.Continue withamlodipine, hctz and bisoprolol, for blood pressure control.  3. Dyslipidemia.Continue withrosuvastatin.  4. Depression.Continue withescitalopram.  5. GERD. Patient with abdominal pain, radiated to the chest, worse with meals. Will continue antiacid therapy with pantoprazole and will add sucralfate tid with meals.  Patient's calculated GFR is more than 190, ruled out chronic kidney disease,his renal function has remained stable will check blood work as needed.   DVT prophylaxis: Lovenox Code Status: Full code Family Communication: Husband, Earvin HansenGerald at bedside Disposition Plan:   Status is: Inpatient  Remains inpatient appropriate because:Ongoing diagnostic testing needed not appropriate for outpatient work up, IV treatments appropriate due to intensity of illness or inability to take PO and Inpatient level of care appropriate due to severity of illness   Dispo: The patient is from: Home  Anticipated d/c is to: Home  Anticipated d/c date is: 1-2 days  Patient currently is not medically stable to d/c.  Consultants:   None  Procedures:   See below  Antimicrobials:  Anti-infectives (From admission, onward)   Start     Dose/Rate Route Frequency Ordered Stop   11/09/19 1000  azithromycin (ZITHROMAX) tablet 500 mg        500 mg Oral Daily 11/09/19 0941 11/10/19 0810       Subjective: Patient seen and evaluated today with ongoing shortness of breath and coughing.  She was noted to have a period of anxiety with worsening shortness of breath  overnight.  Repeat ABG performed at that time which appears stable.  Her oxygen was increased to 8 L nasal cannula  and has now been decreased to 5 L.  She normally wears 2 L oxygen at home.  Objective: Vitals:   11/10/19 0702 11/10/19 0703 11/10/19 0729 11/10/19 0800  BP:   (!) 158/96   Pulse:   73   Resp:   18   Temp:   97.7 F (36.5 C)   TempSrc:      SpO2: 99% 99% 100% 99%  Weight:      Height:       No intake or output data in the 24 hours ending 11/10/19 0922 Filed Weights   11/05/19 2158  Weight: 51.7 kg    Examination:  General exam: Appears calm and comfortable  Respiratory system: Diminished to auscultation bilaterally.  Currently on 5 L nasal cannula oxygen. Cardiovascular system: S1 & S2 heard, RRR. No JVD, murmurs, rubs, gallops or clicks. No pedal edema. Gastrointestinal system: Abdomen is nondistended, soft and nontender. No organomegaly or masses felt. Normal bowel sounds heard. Central nervous system: Alert and oriented. No focal neurological deficits. Extremities: Symmetric 5 x 5 power. Skin: No rashes, lesions or ulcers Psychiatry: Minimally anxious.    Data Reviewed: I have personally reviewed following labs and imaging studies  CBC: Recent Labs  Lab 11/05/19 2101 11/05/19 2223 11/10/19 0412  WBC 7.3  --  7.2  NEUTROABS 6.0  --   --   HGB 13.1 14.3 11.9*  HCT 40.8 42.0 37.0  MCV 89.5  --  90.5  PLT 246  --  256   Basic Metabolic Panel: Recent Labs  Lab 11/05/19 2101 11/05/19 2223 11/07/19 0546 11/08/19 0300 11/10/19 0412  NA 135 133* 135 133* 134*  K 3.6 3.7 4.7 4.5 4.4  CL 86*  --  86* 86* 85*  CO2 36*  --  40* 39* 40*  GLUCOSE 117*  --  112* 118* 133*  BUN 19  --  28* 35* 31*  CREATININE 0.71  --  0.84 0.88 0.96  CALCIUM 9.4  --  9.5 9.4 9.3   GFR: Estimated Creatinine Clearance: 49.4 mL/min (by C-G formula based on SCr of 0.96 mg/dL). Liver Function Tests: Recent Labs  Lab 11/05/19 2101  AST 25  ALT 20  ALKPHOS 54    BILITOT 0.6  PROT 7.9  ALBUMIN 4.2   No results for input(s): LIPASE, AMYLASE in the last 168 hours. No results for input(s): AMMONIA in the last 168 hours. Coagulation Profile: No results for input(s): INR, PROTIME in the last 168 hours. Cardiac Enzymes: No results for input(s): CKTOTAL, CKMB, CKMBINDEX, TROPONINI in the last 168 hours. BNP (last 3 results) No results for input(s): PROBNP in the last 8760 hours. HbA1C: No results for input(s): HGBA1C in the last 72 hours. CBG: No results for input(s): GLUCAP in the last 168 hours. Lipid Profile: No results for input(s): CHOL, HDL, LDLCALC, TRIG, CHOLHDL, LDLDIRECT in the last 72 hours. Thyroid Function Tests: No results for input(s): TSH, T4TOTAL, FREET4, T3FREE, THYROIDAB in the last 72 hours. Anemia Panel: No results for input(s): VITAMINB12, FOLATE, FERRITIN, TIBC, IRON, RETICCTPCT in the last 72 hours. Sepsis Labs: No results for input(s): PROCALCITON, LATICACIDVEN in the last 168 hours.  Recent Results (from the past 240 hour(s))  SARS Coronavirus 2 by RT PCR (hospital order, performed in [facility] hospital lab) Nasopharyngeal Nasopharyngeal Swab     Status: None   Collection Time: 11/02/19 11:31 PM   Specimen: Nasopharyngeal Swab  Result Value Ref Range Status   SARS Coronavirus 2 NEGATIVE NEGATIVE Final  Comment: (NOTE) SARS-CoV-2 target nucleic acids are NOT DETECTED.  The SARS-CoV-2 RNA is generally detectable in upper and lower respiratory specimens during the acute phase of infection. The lowest concentration of SARS-CoV-2 viral copies this assay can detect is 250 copies / mL. A negative result does not preclude SARS-CoV-2 infection and should not be used as the sole basis for treatment or other patient management decisions.  A negative result may occur with improper specimen collection / handling, submission of specimen other than nasopharyngeal swab, presence of viral mutation(s) within the areas targeted  by this assay, and inadequate number of viral copies (<250 copies / mL). A negative result must be combined with clinical observations, patient history, and epidemiological information.  Fact Sheet for Patients:   BoilerBrush.com.cy  Fact Sheet for Healthcare Providers: https://pope.com/  This test is not yet approved or  cleared by the [place] FDA and has been authorized for detection and/or diagnosis of SARS-CoV-2 by FDA under an Emergency Use Authorization (EUA).  This EUA will remain in effect (meaning this test can be used) for the duration of the COVID-19 declaration under Section 564(b)(1) of the Act, 21 U.S.C. section 360bbb-3(b)(1), unless the authorization is terminated or revoked sooner.  Performed at [facility], [address], [place], [place] [address]   SARS Coronavirus 2 by RT PCR (hospital order, performed in [facility] hospital lab) Nasopharyngeal Nasopharyngeal Swab     Status: None   Collection Time: 11/05/19 10:07 PM   Specimen: Nasopharyngeal Swab  Result Value Ref Range Status   SARS Coronavirus 2 NEGATIVE NEGATIVE Final    Comment: (NOTE) SARS-CoV-2 target nucleic acids are NOT DETECTED.  The SARS-CoV-2 RNA is generally detectable in upper and lower respiratory specimens during the acute phase of infection. The lowest concentration of SARS-CoV-2 viral copies this assay can detect is 250 copies / mL. A negative result does not preclude SARS-CoV-2 infection and should not be used as the sole basis for treatment or other patient management decisions.  A negative result may occur with improper specimen collection / handling, submission of specimen other than nasopharyngeal swab, presence of viral mutation(s) within the areas targeted by this assay, and inadequate number of viral copies (<250 copies / mL). A negative result must be combined with clinical observations, patient history,  and epidemiological information.  Fact Sheet for Patients:   BoilerBrush.com.cy  Fact Sheet for Healthcare Providers: https://pope.com/  This test is not yet approved or  cleared by the [place] FDA and has been authorized for detection and/or diagnosis of SARS-CoV-2 by FDA under an Emergency Use Authorization (EUA).  This EUA will remain in effect (meaning this test can be used) for the duration of the COVID-19 declaration under Section 564(b)(1) of the Act, 21 U.S.C. section 360bbb-3(b)(1), unless the authorization is terminated or revoked sooner.  Performed at [facility], [address], [place], [place] [address]          Radiology Studies: DG Chest Port 1 View  Result Date: 11/09/2019 CLINICAL DATA:  Dyspnea, asthma EXAM: PORTABLE CHEST 1 VIEW COMPARISON:  11/05/2019, 04/13/2019 FINDINGS: Minimal left basilar atelectasis. Lungs remain stably, symmetrically mildly hyperinflated in keeping with changes of underlying COPD. No superimposed focal pulmonary infiltrate. No pneumothorax or pleural effusion. Cardiac size is within normal limits. Prominence of the hila bilaterally stable since multiple prior examinations and simply represents a combination of vascular shadow and calcified hilar adenopathy related to old granulomatous disease. The thoracic aorta is tortuous,  but otherwise unremarkable. No acute bone abnormality. IMPRESSION: Stable pulmonary hyperinflation in keeping with underlying COPD. Electronically Signed   By: Helyn Numbers MD   On: 11/09/2019 21:17        Scheduled Meds: . amLODipine  5 mg Oral Daily  . aspirin EC  81 mg Oral Daily  . bisoprolol  10 mg Oral Daily  . budesonide (PULMICORT) nebulizer solution  0.25 mg Nebulization BID  . enoxaparin (LOVENOX) injection  40 mg Subcutaneous Q24H  . escitalopram  20 mg Oral Daily  . fluticasone furoate-vilanterol  1 puff Inhalation Daily  .  hydrochlorothiazide  25 mg Oral Daily  . ipratropium-albuterol  3 mL Nebulization BID  . methylPREDNISolone (SOLU-MEDROL) injection  40 mg Intravenous Q12H  . pantoprazole  40 mg Oral Daily  . rosuvastatin  20 mg Oral q1800  . sucralfate  1 g Oral TID WC & HS   Continuous Infusions:   LOS: 4 days    Time spent: 30 minutes    Alayiah Fontes Hoover Brunette, DO [facility]  If 7PM-7AM, please contact night-coverage [email or web address] 11/10/2019, 9:22 AM

## 2019-11-10 NOTE — Progress Notes (Signed)
Physical Therapy Treatment Patient Details Name: Kathleen Cameron MRN: [id number] DOB: February 24, 1966 Today's Date: 11/10/2019    History of Present Illness 54 yo female c/o increasing SOB. Recent ED visit on 7/14 for COPD exacerbation. Admitted for recurrent COPD exacerbation. PMH asthma and HTN    PT Comments    Patient received up in chair, more fatigued than usual after asthma/panic attack last night but willing to participate. Introduced step training with min guard- able to perform well but definitely very weak and fearful especially with descent but able to maintain balance and safety well. Continued gait training approximately 38f in hallway with min guard. More education provided on benefits of deep "belly breathing" to help improve breathing and reduce anxiety, also had patient teach back on IS use and reviewed frequency of IS exercises/use. Left sitting up in chair with all needs met this morning. Progressing well.     Follow Up Recommendations  Home health PT     Equipment Recommendations  3in1 (PT)    Recommendations for Other Services       Precautions / Restrictions Precautions Precautions: Other (comment);Fall Precaution Comments: watch O2/HR Restrictions Weight Bearing Restrictions: No    Mobility  Bed Mobility               General bed mobility comments: OOB in chair  Transfers Overall transfer level: Needs assistance Equipment used: None Transfers: Sit to/from Stand Sit to Stand: Min guard         General transfer comment: min guard for safety  Ambulation/Gait Ambulation/Gait assistance: Min guard Gait Distance (Feet): 50 Feet Assistive device: None Gait Pattern/deviations: Step-through pattern;Trunk flexed Gait velocity: decreased   General Gait Details: min guard provided for safety but not physical assist given   Stairs Stairs: Yes Stairs assistance: Min guard Stair Management: Two rails;Step to pattern;Forwards Number of  Stairs: 6 General stair comments: slow but steady, lacked some eccentric strength and fatigued easily; became more unsteady with fatigue. Increased fear when coming down.   Wheelchair Mobility    Modified Rankin (Stroke Patients Only)       Balance Overall balance assessment: Mild deficits observed, not formally tested                                          Cognition Arousal/Alertness: Awake/alert Behavior During Therapy: WFL for tasks assessed/performed Overall Cognitive Status: Within Functional Limits for tasks assessed                                 General Comments: good awareness of safety      Exercises      General Comments General comments (skin integrity, edema, etc.): sats 96% at rest and 94% after activity; HR WNL. 5LPM O2 per [place].      Pertinent Vitals/Pain Pain Assessment: No/denies pain Pain Intervention(s): Limited activity within patient's tolerance;Monitored during session    Home Living                      Prior Function            PT Goals (current goals can now be found in the care plan section) Acute Rehab PT Goals Patient Stated Goal: to get back to cooking PT Goal Formulation: With patient Time For Goal Achievement: 11/21/19 Potential to Achieve Goals:  Good Progress towards PT goals: Progressing toward goals    Frequency    Min 3X/week      PT Plan Current plan remains appropriate    Co-evaluation              AM-PAC PT "6 Clicks" Mobility   Outcome Measure  Help needed turning from your back to your side while in a flat bed without using bedrails?: None Help needed moving from lying on your back to sitting on the side of a flat bed without using bedrails?: None Help needed moving to and from a bed to a chair (including a wheelchair)?: None Help needed standing up from a chair using your arms (e.g., wheelchair or bedside chair)?: None Help needed to walk in hospital room?: A  Little Help needed climbing 3-5 steps with a railing? : A Little 6 Click Score: 22    End of Session Equipment Utilized During Treatment: Gait belt;Oxygen Activity Tolerance: Patient tolerated treatment well Patient left: in chair;with call bell/phone within reach   PT Visit Diagnosis: Unsteadiness on feet (R26.81);Muscle weakness (generalized) (M62.81)     Time: 1427-6701 PT Time Calculation (min) (ACUTE ONLY): 20 min  Charges:  $Gait Training: 8-22 mins                     Windell Norfolk, DPT, PN1   Supplemental Physical Therapist Jeff    Pager [phone] Acute Rehab Office [phone]

## 2019-11-11 MED ORDER — ALBUTEROL SULFATE (2.5 MG/3ML) 0.083% IN NEBU
2.5000 mg | INHALATION_SOLUTION | Freq: Three times a day (TID) | RESPIRATORY_TRACT | Status: DC
  Filled 2019-11-11: qty 3

## 2019-11-11 MED ORDER — LEVALBUTEROL HCL 0.63 MG/3ML IN NEBU: 0.6300 mg | INHALATION_SOLUTION | Freq: Three times a day (TID) | RESPIRATORY_TRACT | Status: DC | PRN

## 2019-11-11 MED ORDER — LEVALBUTEROL HCL 0.63 MG/3ML IN NEBU
0.6300 mg | INHALATION_SOLUTION | Freq: Two times a day (BID) | RESPIRATORY_TRACT | Status: DC
  Filled 2019-11-11 (×2): qty 3

## 2019-11-11 MED ORDER — LEVALBUTEROL HCL 0.63 MG/3ML IN NEBU: 0.6300 mg | INHALATION_SOLUTION | Freq: Three times a day (TID) | RESPIRATORY_TRACT | Status: DC

## 2019-11-11 NOTE — TOC Initial Note (Signed)
Transition of Care [facility]) - Initial/Assessment Note    Patient Details  Name: Kathleen Cameron MRN: [id number] Date of Birth: 09-19-1965  Transition of Care [facility]) CM/SW Contact:    Arvella Merles, [place] Phone Number: 11/11/2019, 1:15 PM  Clinical Narrative:                 CSW met with patient to discuss PT recommendation for Home Health at discharge. Patient expressed understanding and in agreement with Home Health at discharge. Patient has a 3in1 and oxygen at home with [employer]. CSW will continue to follow and assist with discharge planning needs.  Expected Discharge Plan: [place] Barriers to Discharge: Continued Medical Work up   Patient Goals and CMS Choice   CMS Medicare.gov Compare Post Acute Care list provided to:: Patient Choice offered to / list presented to : Patient  Expected Discharge Plan and Services Expected Discharge Plan: [place]       Living arrangements for the past 2 months: Apartment                                      Prior Living Arrangements/Services Living arrangements for the past 2 months: Apartment Lives with:: Significant Other Patient language and need for interpreter reviewed:: Yes        Need for Family Participation in Patient Care: No (Comment) Care giver support system in place?: Yes (comment)   Criminal Activity/Legal Involvement Pertinent to Current Situation/Hospitalization: No - Comment as needed  Activities of Daily Living Home Assistive Devices/Equipment: Walker (specify type), Eyeglasses ADL Screening (condition at time of admission) Patient's cognitive ability adequate to safely complete daily activities?: Yes Is the patient deaf or have difficulty hearing?: No Does the patient have difficulty seeing, even when wearing glasses/contacts?: No Does the patient have difficulty concentrating, remembering, or making decisions?: No Patient able to express need for assistance with  ADLs?: Yes Does the patient have difficulty dressing or bathing?: No Independently performs ADLs?: Yes (appropriate for developmental age) Does the patient have difficulty walking or climbing stairs?: Yes Weakness of Legs: Both Weakness of Arms/Hands: None  Permission Sought/Granted Permission sought to share information with : Chartered certified accountant granted to share information with : Yes, Verbal Permission Granted     Permission granted to share info w AGENCY: Home Health        Emotional Assessment   Attitude/Demeanor/Rapport: Unable to Assess Affect (typically observed): Pleasant Orientation: : Oriented to Self, Oriented to Place, Oriented to  Time, Oriented to Situation   Psych Involvement: No (comment)  Admission diagnosis:  COPD exacerbation ([place]) [J44.1] Respiratory failure ([place]) [J96.90] Patient Active Problem List   Diagnosis Date Noted  . COPD exacerbation ([place]) 11/06/2019  . Depression 11/06/2019  . Acute on chronic respiratory failure with hypoxia and hypercapnia ([place]) 11/06/2019  . Respiratory failure ([place]) 11/06/2019  . Current moderate episode of major depressive disorder without prior episode ([place]) 09/20/2019  . Pedal edema 08/04/2019  . Chronic respiratory failure with hypoxia ([place]) 03/07/2019  . CKD (chronic kidney disease) 01/05/2019  . COPD (chronic obstructive pulmonary disease) ([place]) 09/29/2018  . Pulmonary nodules 09/27/2018  . Dyslipidemia 07/07/2018  . Essential hypertension 03/10/2018   PCP:  Horald Pollen, MD Pharmacy:   [facility], [place] - [address] AT [address] [place]  [place] [address] Phone: [phone] Fax: [phone]  [facility] [place], [place] [address] [place] [address] Phone: [phone] Fax: [phone]     Social Determinants of Health (SDOH) Interventions     Readmission Risk Interventions No flowsheet data found.

## 2019-11-11 NOTE — Progress Notes (Signed)
Discontinued duoneb for patient safety. Active allergy.

## 2019-11-11 NOTE — Progress Notes (Signed)
PROGRESS NOTE    Kathleen Cameron  TFT:[id number] DOB: [birth year] DOA: 11/05/2019 PCP: [name], MD   Brief Narrative:  54 year old female with past medical history for COPD, chronic hypoxic respiratory failure, hypertensionanddyslipidemia. She reported several weeks of dyspnea, progressive symptoms to the point where she becamedyspneic with minimal activities. Positive wheezing and decreased p.o. intake. She was seen in the ED July 14, diagnosed with COPD exacerbation, prescribed Augmentin and prednisone. On her initial physical examination blood pressure 147/87, heart rate 78, respiratory 26, temperature 97.7, oxygen saturation 99% on supplemental oxygen. Her lungs have decreased breath sounds bilaterally, poor air movement, heart S1-S2, present rhythmic, abdomen soft, no lower extremity edema. Sodium 135, potassium 3.6, chloride 86, bicarb 36, glucose 117, BUN 19, creatinine 0.71, white count 7.3, hemoglobin 13.1, hematocrit 40.9, platelets 246. SARS COVID-19 negative. Venous blood gas pH 7.30, PCO2 80, PO2 77, bicarb 46. Chest radiograph with hyperinflation. EKG 69 bpm, normal axis, normal intervals, QTc manually corrected 483, sinus rhythm, poor R wave progression, no ST segment or T wave changes.  Patient placed on systemic corticosteroids and aggressive bronchodilator therapy   Assessment & Plan:   Principal Problem:   Acute on chronic respiratory failure with hypoxia and hypercapnia (HCC) Active Problems:   Essential hypertension   Dyslipidemia   COPD exacerbation (HCC)   Depression   Respiratory failure (HCC)   1. Acute on chronic hypoxic and hypercapnic respiratory failure due to COPD exacerbation. Present on admission.Continue to havepersistentdyspnea with acute episode overnight. -Normally wears 2 L nasal cannula oxygen at home and is currently on 5 L nasal cannula oxygen.  Continue withaggressive bronchodilator therapy, airway clearing  techniques with flutter valve -incentive spirometer. Onsystemic steroids(methylprednisolone 40 mg IV bid)and inhaled corticosteroids plusLABA.Continue with antitussive agents. -Patient noted to have significant tachypnea as well as tachycardia and some distress after DuoNeb and Pulmicort administration yesterday evening.  She is noted to have some allergy to tiotropium and it was also thought that the albuterol may have been contributing to her tachycardia.  Discussed with respiratory therapy this a.m. and will change breathing treatments to Xopenex and anticipate that she will need this on discharge as opposed to albuterol in the future. -Continue ongoing treatment through today as she is gradually showing signs of progress and may be ready for discharge by a.m.  Azithromycin 500 mg dc s/p 5 days total treatment.  2. HTN.Continue withamlodipine, hctz and bisoprolol, for blood pressure control.  3. Dyslipidemia.Continue withrosuvastatin.  4. Depression.Continue withescitalopram.  5. GERD. Patient with abdominal pain, radiated to the chest, worse with meals. Will continue antiacid therapy with pantoprazole and will add sucralfate tid with meals.  Patient's calculated GFR is more than 190, ruled out chronic kidney disease,his renal function has remained stable will check blood work as needed.   DVT prophylaxis:Lovenox Code Status:Full code Family Communication:Husband, Gerald at bedside Disposition Plan:  Status is: Inpatient  Remains inpatient appropriate because:Ongoing diagnostic testing needed not appropriate for outpatient work up, IV treatments appropriate due to intensity of illness or inability to take PO and Inpatient level of care appropriate due to severity of illness   Dispo: The patient is from:Home Anticipated d/c is HC:[facility] Anticipated d/c date is: 1 days Patient currently is not medically  stable to d/c.  Anticipate discharge by a.m. if no further incidents noted with breathing treatments overnight.  Consultants:  None  Procedures:  See below  Antimicrobials:  Anti-infectives (From admission, onward)   Start     Dose/Rate Route  Frequency Ordered Stop   11/09/19 1000  azithromycin (ZITHROMAX) tablet 500 mg        500 mg Oral Daily 11/09/19 0941 11/10/19 0810       Subjective: Patient seen and evaluated today with no new acute complaints or concerns.  She feels as though her breathing is slowly improving, but she did have a significant episode of tachycardia as well as tachypnea with breathing treatments given overnight.  Objective: Vitals:   11/10/19 2015 11/11/19 0728 11/11/19 0812 11/11/19 0917  BP:   (!) 141/94   Pulse:   72   Resp:   17   Temp:   97.7 F (36.5 C)   TempSrc:      SpO2: 98% 94%  91%  Weight:      Height:        Intake/Output Summary (Last 24 hours) at 11/11/2019 1122 Last data filed at 11/11/2019 0917 Gross per 24 hour  Intake 120 ml  Output --  Net 120 ml   Filed Weights   11/05/19 2158  Weight: 51.7 kg    Examination:  General exam: Appears calm and comfortable  Respiratory system: Minimal wheezing bilaterally. Respiratory effort normal.  Currently on 2 L nasal cannula oxygen which is her usual baseline. Cardiovascular system: S1 & S2 heard, RRR. No JVD, murmurs, rubs, gallops or clicks. No pedal edema. Gastrointestinal system: Abdomen is nondistended, soft and nontender. No organomegaly or masses felt. Normal bowel sounds heard. Central nervous system: Alert and oriented. No focal neurological deficits. Extremities: Symmetric 5 x 5 power. Skin: No rashes, lesions or ulcers Psychiatry: Judgement and insight appear normal. Mood & affect appropriate.     Data Reviewed: I have personally reviewed following labs and imaging studies  CBC: Recent Labs  Lab 11/05/19 2101 11/05/19 2223 11/10/19 0412  WBC 7.3  --  7.2   NEUTROABS 6.0  --   --   HGB 13.1 14.3 11.9*  HCT 40.8 42.0 37.0  MCV 89.5  --  90.5  PLT 246  --  256   Basic Metabolic Panel: Recent Labs  Lab 11/05/19 2101 11/05/19 2223 11/07/19 0546 11/08/19 0300 11/10/19 0412  NA 135 133* 135 133* 134*  K 3.6 3.7 4.7 4.5 4.4  CL 86*  --  86* 86* 85*  CO2 36*  --  40* 39* 40*  GLUCOSE 117*  --  112* 118* 133*  BUN 19  --  28* 35* 31*  CREATININE 0.71  --  0.84 0.88 0.96  CALCIUM 9.4  --  9.5 9.4 9.3   GFR: Estimated Creatinine Clearance: 49.4 mL/min (by C-G formula based on SCr of 0.96 mg/dL). Liver Function Tests: Recent Labs  Lab 11/05/19 2101  AST 25  ALT 20  ALKPHOS 54  BILITOT 0.6  PROT 7.9  ALBUMIN 4.2   No results for input(s): LIPASE, AMYLASE in the last 168 hours. No results for input(s): AMMONIA in the last 168 hours. Coagulation Profile: No results for input(s): INR, PROTIME in the last 168 hours. Cardiac Enzymes: No results for input(s): CKTOTAL, CKMB, CKMBINDEX, TROPONINI in the last 168 hours. BNP (last 3 results) No results for input(s): PROBNP in the last 8760 hours. HbA1C: No results for input(s): HGBA1C in the last 72 hours. CBG: No results for input(s): GLUCAP in the last 168 hours. Lipid Profile: No results for input(s): CHOL, HDL, LDLCALC, TRIG, CHOLHDL, LDLDIRECT in the last 72 hours. Thyroid Function Tests: No results for input(s): TSH, T4TOTAL, FREET4, T3FREE, THYROIDAB in the  last 72 hours. Anemia Panel: No results for input(s): VITAMINB12, FOLATE, FERRITIN, TIBC, IRON, RETICCTPCT in the last 72 hours. Sepsis Labs: No results for input(s): PROCALCITON, LATICACIDVEN in the last 168 hours.  Recent Results (from the past 240 hour(s))  SARS Coronavirus 2 by RT PCR (hospital order, performed in [facility] hospital lab) Nasopharyngeal Nasopharyngeal Swab     Status: None   Collection Time: 11/02/19 11:31 PM   Specimen: Nasopharyngeal Swab  Result Value Ref Range Status   SARS Coronavirus 2  NEGATIVE NEGATIVE Final    Comment: (NOTE) SARS-CoV-2 target nucleic acids are NOT DETECTED.  The SARS-CoV-2 RNA is generally detectable in upper and lower respiratory specimens during the acute phase of infection. The lowest concentration of SARS-CoV-2 viral copies this assay can detect is 250 copies / mL. A negative result does not preclude SARS-CoV-2 infection and should not be used as the sole basis for treatment or other patient management decisions.  A negative result may occur with improper specimen collection / handling, submission of specimen other than nasopharyngeal swab, presence of viral mutation(s) within the areas targeted by this assay, and inadequate number of viral copies (<250 copies / mL). A negative result must be combined with clinical observations, patient history, and epidemiological information.  Fact Sheet for Patients:   BoilerBrush.com.cy  Fact Sheet for Healthcare Providers: https://pope.com/  This test is not yet approved or  cleared by the [place] FDA and has been authorized for detection and/or diagnosis of SARS-CoV-2 by FDA under an Emergency Use Authorization (EUA).  This EUA will remain in effect (meaning this test can be used) for the duration of the COVID-19 declaration under Section 564(b)(1) of the Act, 21 U.S.C. section 360bbb-3(b)(1), unless the authorization is terminated or revoked sooner.  Performed at [facility], [address] [place], [place] [address]   SARS Coronavirus 2 by RT PCR (hospital order, performed in [facility] hospital lab) Nasopharyngeal Nasopharyngeal Swab     Status: None   Collection Time: 11/05/19 10:07 PM   Specimen: Nasopharyngeal Swab  Result Value Ref Range Status   SARS Coronavirus 2 NEGATIVE NEGATIVE Final    Comment: (NOTE) SARS-CoV-2 target nucleic acids are NOT DETECTED.  The SARS-CoV-2 RNA is generally detectable in upper and  lower respiratory specimens during the acute phase of infection. The lowest concentration of SARS-CoV-2 viral copies this assay can detect is 250 copies / mL. A negative result does not preclude SARS-CoV-2 infection and should not be used as the sole basis for treatment or other patient management decisions.  A negative result may occur with improper specimen collection / handling, submission of specimen other than nasopharyngeal swab, presence of viral mutation(s) within the areas targeted by this assay, and inadequate number of viral copies (<250 copies / mL). A negative result must be combined with clinical observations, patient history, and epidemiological information.  Fact Sheet for Patients:   BoilerBrush.com.cy  Fact Sheet for Healthcare Providers: https://pope.com/  This test is not yet approved or  cleared by the [place] FDA and has been authorized for detection and/or diagnosis of SARS-CoV-2 by FDA under an Emergency Use Authorization (EUA).  This EUA will remain in effect (meaning this test can be used) for the duration of the COVID-19 declaration under Section 564(b)(1) of the Act, 21 U.S.C. section 360bbb-3(b)(1), unless the authorization is terminated or revoked sooner.  Performed at [facility], [address] [place], [place] [address]  Radiology Studies: DG Chest Port 1 View  Result Date: 11/09/2019 CLINICAL DATA:  Dyspnea, asthma EXAM: PORTABLE CHEST 1 VIEW COMPARISON:  11/05/2019, 04/13/2019 FINDINGS: Minimal left basilar atelectasis. Lungs remain stably, symmetrically mildly hyperinflated in keeping with changes of underlying COPD. No superimposed focal pulmonary infiltrate. No pneumothorax or pleural effusion. Cardiac size is within normal limits. Prominence of the hila bilaterally stable since multiple prior examinations and simply represents a combination of vascular shadow and calcified  hilar adenopathy related to old granulomatous disease. The thoracic aorta is tortuous, but otherwise unremarkable. No acute bone abnormality. IMPRESSION: Stable pulmonary hyperinflation in keeping with underlying COPD. Electronically Signed   By: Helyn NumbersAshesh  Parikh MD   On: 11/09/2019 21:17        Scheduled Meds: . amLODipine  5 mg Oral Daily  . aspirin EC  81 mg Oral Daily  . bisoprolol  10 mg Oral Daily  . enoxaparin (LOVENOX) injection  40 mg Subcutaneous Q24H  . escitalopram  20 mg Oral Daily  . fluticasone furoate-vilanterol  1 puff Inhalation Daily  . hydrochlorothiazide  25 mg Oral Daily  . levalbuterol  0.63 mg Nebulization Q8H  . methylPREDNISolone (SOLU-MEDROL) injection  40 mg Intravenous Q12H  . pantoprazole  40 mg Oral Daily  . rosuvastatin  20 mg Oral q1800  . sucralfate  1 g Oral TID WC & HS    LOS: 5 days    Time spent: 30 minutes    Ayven Pheasant Hoover Brunette Kyanne Rials, DO [facility]  If 7PM-7AM, please contact night-coverage [email or web address] 11/11/2019, 11:22 AM

## 2019-11-11 NOTE — Progress Notes (Signed)
OT Cancellation Note  Patient Details Name: SHAMETRA CUMBERLAND MRN: [id number] DOB: 07-08-1965   Cancelled Treatment:    Reason Eval/Treat Not Completed: Patient declined, no reason specified. Pt declined OT session today reporting difficulty breathing and anxiety after last night's episode. Reinforced slow breathing, possible need for increased supplemental O2 with activity and recommended pt obtain a pulse ox to monitor SpO2 vitals.   At rest on 2 L O2, 97%, decreased to 94% when conversing with therapist.   Lorre Munroe 11/11/2019, 2:27 PM

## 2019-11-11 NOTE — Progress Notes (Signed)
Post nebullizer treatment, patient received duoneb/pulmicort as scheduled. patient started having adverse effects to the medication immediately after treatment. Pt stating "this happen to me lastnight as well". Patient was tachypneic and oxygenation status was compromised w/ saturations in the 70's w/ a great pleth. Patient was in apparent resp. distress in the setting of hypoxia and increase WOB. Patient was requiring a NRB. Patient has low O2 reserve. BBS to auscultation reveals airflow limitation throughout. Took over for patient to return to baseline. Patient states takes albuterol at home not duoneb. RN at bedside as well.

## 2019-11-12 MED ORDER — LEVALBUTEROL HCL 0.63 MG/3ML IN NEBU: 0.6300 mg | INHALATION_SOLUTION | RESPIRATORY_TRACT | Status: DC | PRN

## 2019-11-12 MED ORDER — ALUM & MAG HYDROXIDE-SIMETH 200-200-20 MG/5ML PO SUSP: 15.0000 mL | Freq: Four times a day (QID) | ORAL | 0 refills | Status: DC | PRN

## 2019-11-12 MED ORDER — ALPRAZOLAM 0.25 MG PO TABS: 0.2500 mg | ORAL_TABLET | Freq: Three times a day (TID) | ORAL | 0 refills | Status: DC | PRN

## 2019-11-12 MED ORDER — PREDNISONE 10 MG PO TABS: 40.0000 mg | ORAL_TABLET | Freq: Every day | ORAL | 0 refills | Status: AC

## 2019-11-12 NOTE — TOC Transition Note (Addendum)
Transition of Care [facility]) - CM/SW Discharge Note   Patient Details  Name: Kathleen Cameron MRN: [id number] Date of Birth: [birth year]  [facility] CM/SW Contact:  Bess Kinds, RN Phone Number: [phone] 11/12/2019, 12:08 PM   Clinical Narrative:     Update: MD agreeable  of start of care date for Tuesday 7/27.  Update: [facility] able to accept referral for [facility] PT with start of care Tuesday 7/27. MD notified of start of care date. Patient notified of start of care and agreeable.   Spoke with patient at the bedside to discuss transition plans. Verified patient address and phone number in Epic. Patient states that she will be staying with a friend at discharge: [address] [place], [place] [address]. PCP and pharmacy verified in Epic as correct. Patient has 3N1 and oxygen from [employer]. She has transportation home at discharge. Discussed recommendations for [facility] PT - patient agreeable - referral pending with [facility] agency. TOC following for transition needs.   Final next level of care: Home w Home Health Services Barriers to Discharge: No Barriers Identified   Patient Goals and CMS Choice Patient states their goals for this hospitalization and ongoing recovery are:: home with a friend CMS Medicare.gov Compare Post Acute Care list provided to:: Patient Choice offered to / list presented to : Patient  Discharge Placement                       Discharge Plan and Services In-house Referral: NA Discharge Planning Services: CM Consult Post Acute Care Choice: Home Health          DME Arranged: N/A DME Agency: NA       HH Arranged: PT          Social Determinants of Health (SDOH) Interventions     Readmission Risk Interventions No flowsheet data found.

## 2019-11-12 NOTE — Discharge Summary (Signed)
Physician Discharge Summary  Kathleen Cameron AES:[id number] DOB: 22-Feb-1966 DOA: 11/05/2019  PCP: [name], MD  Admit date: 11/05/2019  Discharge date: 11/12/2019  Admitted From:Home  Disposition:  Home  Recommendations for Outpatient Follow-up:  1. Follow up with PCP in 1-2 weeks 2. Please obtain BMP/CBC in one week 3. Xanax prescribed as needed for anxiety concerns with # 20 tablets and 0 refills 4. Continue on prednisone 40 mg daily as prescribed for 5 days 5. Home breathing treatments to be used as needed, patient does have duo nebs  Home Health: Yes with PT  Equipment/Devices: Patient has chronic 4 L nasal cannula oxygen at home, 3 n 1  Discharge Condition: Stable  CODE STATUS: Full  Diet recommendation: Heart Healthy  Brief/Interim Summary: 54 year old female with past medical history for COPD, chronic hypoxic respiratory failure, hypertensionanddyslipidemia. She reported several weeks of dyspnea, progressive symptoms to the point where she becamedyspneic with minimal activities. Positive wheezing and decreased p.o. intake. She was seen in the ED July 14, diagnosed with COPD exacerbation, prescribed Augmentin and prednisone. On her initial physical examination blood pressure 147/87, heart rate 78, respiratory 26, temperature 97.7, oxygen saturation 99% on supplemental oxygen. Her lungs have decreased breath sounds bilaterally, poor air movement, heart S1-S2, present rhythmic, abdomen soft, no lower extremity edema. Sodium 135, potassium 3.6, chloride 86, bicarb 36, glucose 117, BUN 19, creatinine 0.71, white count 7.3, hemoglobin 13.1, hematocrit 40.9, platelets 246. SARS COVID-19 negative. Venous blood gas pH 7.30, PCO2 80, PO2 77, bicarb 46. Chest radiograph with hyperinflation. EKG 69 bpm, normal axis, normal intervals, QTc manually corrected 483, sinus rhythm, poor R wave progression, no ST segment or T wave changes.  1. Acute on chronic hypoxic  and hypercapnic respiratory failure due to COPD exacerbation. Present on admission. -Currently resolved and patient on baseline 4 L nasal cannula oxygen -Patient has completed 5 days of azithromycin -Continue home breathing treatments as needed for shortness of breath or wheezing -Continue on prednisone 40 mg daily as prescribed for the next 5 days  2. HTN.Continue withamlodipine, hctz and bisoprolol, for blood pressure control.  3. Dyslipidemia.Continue withrosuvastatin.  4. Depression.Continue withescitalopram.  5. GERD. Patient with abdominal pain, radiated to the chest, worse with meals. -Continue home PPI and Maalox as needed  Discharge Diagnoses:  Principal Problem:   Acute on chronic respiratory failure with hypoxia and hypercapnia (HCC) Active Problems:   Essential hypertension   Dyslipidemia   COPD exacerbation (HCC)   Depression   Respiratory failure (HCC)  Principal discharge diagnosis: Acute on chronic combined respiratory failure secondary to COPD exacerbation.  Discharge Instructions  Discharge Instructions    Diet - low sodium heart healthy   Complete by: As directed    Increase activity slowly   Complete by: As directed      Allergies as of 11/12/2019      Reactions   Stiolto Respimat [tiotropium Bromide-olodaterol] Other (See Comments)   Severe headaches and rapid heartrate      Medication List    STOP taking these medications   amoxicillin-clavulanate 875-125 MG tablet Commonly known as: AUGMENTIN   lisinopril 10 MG tablet Commonly known as: ZESTRIL     TAKE these medications   acetaminophen 500 MG tablet Commonly known as: TYLENOL Take 500 mg by mouth daily as needed for mild pain or headache.   ALPRAZolam 0.25 MG tablet Commonly known as: XANAX Take 1 tablet (0.25 mg total) by mouth 3 (three) times daily as needed for anxiety.  alum & mag hydroxide-simeth 200-200-20 MG/5ML suspension Commonly known as:  MAALOX/MYLANTA Take 15 mLs by mouth every 6 (six) hours as needed for indigestion or heartburn.   amLODipine 5 MG tablet Commonly known as: NORVASC Take 1 tablet (5 mg total) by mouth daily.   aspirin 81 MG EC tablet Take 1 tablet (81 mg total) by mouth daily.   bisoprolol 10 MG tablet Commonly known as: ZEBETA Take 1 tablet (10 mg total) by mouth daily.   Breztri Aerosphere 160-9-4.8 MCG/ACT Aero Generic drug: Budeson-Glycopyrrol-Formoterol Inhale 2 puffs into the lungs in the morning and at bedtime.   escitalopram 20 MG tablet Commonly known as: Lexapro Take 1 tablet (20 mg total) by mouth daily.   guaiFENesin 600 MG 12 hr tablet Commonly known as: MUCINEX Take 600 mg by mouth daily as needed for cough or to loosen phlegm.   hydrochlorothiazide 25 MG tablet Commonly known as: HYDRODIURIL Take 25 mg by mouth daily.   ipratropium-albuterol 0.5-2.5 (3) MG/3ML Soln Commonly known as: DUONEB Take 3 mLs by nebulization every 6 (six) hours as needed. What changed: reasons to take this   nitroGLYCERIN 0.4 MG SL tablet Commonly known as: NITROSTAT Place 1 tablet (0.4 mg total) under the tongue every 5 (five) minutes as needed for chest pain.   pantoprazole 40 MG tablet Commonly known as: PROTONIX Take 1 tablet (40 mg total) by mouth daily.   predniSONE 10 MG tablet Commonly known as: DELTASONE Take 4 tablets (40 mg total) by mouth daily for 5 days.   rosuvastatin 20 MG tablet Commonly known as: CRESTOR Take 1 tablet (20 mg total) by mouth daily.            Durable Medical Equipment  (From admission, onward)         Start     Ordered   11/12/19 0944  DME 3-in-1  Once        11/12/19 6803          Follow-up Information    [name], MD Follow up in 1 week(s).   Specialty: Internal Medicine Contact information: [address] [place] [address] [phone]              Allergies  Allergen Reactions  . Stiolto Respimat [Tiotropium  Bromide-Olodaterol] Other (See Comments)    Severe headaches and rapid heartrate    Consultations:  None   Procedures/Studies: DG Chest Port 1 View  Result Date: 11/09/2019 CLINICAL DATA:  Dyspnea, asthma EXAM: PORTABLE CHEST 1 VIEW COMPARISON:  11/05/2019, 04/13/2019 FINDINGS: Minimal left basilar atelectasis. Lungs remain stably, symmetrically mildly hyperinflated in keeping with changes of underlying COPD. No superimposed focal pulmonary infiltrate. No pneumothorax or pleural effusion. Cardiac size is within normal limits. Prominence of the hila bilaterally stable since multiple prior examinations and simply represents a combination of vascular shadow and calcified hilar adenopathy related to old granulomatous disease. The thoracic aorta is tortuous, but otherwise unremarkable. No acute bone abnormality. IMPRESSION: Stable pulmonary hyperinflation in keeping with underlying COPD. Electronically Signed   By: Helyn Numbers MD   On: 11/09/2019 21:17   DG Chest Port 1 View  Result Date: 11/05/2019 CLINICAL DATA:  SOB EXAM: PORTABLE CHEST 1 VIEW COMPARISON:  Chest radiograph 11/02/2019 FINDINGS: The heart size and mediastinal contours are within normal limits. Lungs are hyperinflated. Mild streaky opacities in the bilateral mid to lower lungs. Unchanged small nodules in the right upper lung. No new focal consolidation. No pneumothorax or large pleural effusion. No acute  finding in the visualized skeleton. IMPRESSION: Mild streaky opacities in the bilateral mid to lower lungs favored to represent atelectasis, similar to prior. Electronically Signed   By: Emmaline Kluver M.D.   On: 11/05/2019 20:29   DG Chest Port 1 View  Result Date: 11/02/2019 CLINICAL DATA:  Shortness.  Respiratory distress. EXAM: PORTABLE CHEST 1 VIEW COMPARISON:  Chest radiograph 07/23/2019. Included portions from cardiac CT 06/08/2019, chest CT 04/13/2019 FINDINGS: Mild chronic hyperinflation. Linear opacities in both mid  lower lung zones typical of subsegmental atelectasis or scarring. Right upper lobe nodular densities there pleural based on prior CT. Heart is normal in size. Unchanged mediastinal contours with mild aortic tortuosity. No confluent airspace disease, pulmonary edema, large pleural effusion or pneumothorax. No acute osseous abnormalities are seen. IMPRESSION: 1. Streaky bibasilar atelectasis or scarring. 2. Chronic mild hyperinflation. Electronically Signed   By: Narda Rutherford M.D.   On: 11/02/2019 22:45     Discharge Exam: Vitals:   11/12/19 0718 11/12/19 0837  BP:  (!) 144/80  Pulse:  63  Resp:  18  Temp:  98.3 F (36.8 C)  SpO2: 96% 93%   Vitals:   11/11/19 1625 11/11/19 2201 11/12/19 0718 11/12/19 0837  BP: (!) 139/90 (!) 160/92  (!) 144/80  Pulse: 69 71  63  Resp: 16 18  18   Temp:  98 F (36.7 C)  98.3 F (36.8 C)  TempSrc:  Oral    SpO2: 100% 98% 96% 93%  Weight:      Height:        General: Pt is alert, awake, not in acute distress Cardiovascular: RRR, S1/S2 +, no rubs, no gallops Respiratory: CTA bilaterally, no wheezing, no rhonchi, currently on 4 L nasal cannula oxygen Abdominal: Soft, NT, ND, bowel sounds + Extremities: no edema, no cyanosis    The results of significant diagnostics from this hospitalization (including imaging, microbiology, ancillary and laboratory) are listed below for reference.     Microbiology: Recent Results (from the past 240 hour(s))  SARS Coronavirus 2 by RT PCR (hospital order, performed in [facility] hospital lab) Nasopharyngeal Nasopharyngeal Swab     Status: None   Collection Time: 11/02/19 11:31 PM   Specimen: Nasopharyngeal Swab  Result Value Ref Range Status   SARS Coronavirus 2 NEGATIVE NEGATIVE Final    Comment: (NOTE) SARS-CoV-2 target nucleic acids are NOT DETECTED.  The SARS-CoV-2 RNA is generally detectable in upper and lower respiratory specimens during the acute phase of infection. The lowest concentration of  SARS-CoV-2 viral copies this assay can detect is 250 copies / mL. A negative result does not preclude SARS-CoV-2 infection and should not be used as the sole basis for treatment or other patient management decisions.  A negative result may occur with improper specimen collection / handling, submission of specimen other than nasopharyngeal swab, presence of viral mutation(s) within the areas targeted by this assay, and inadequate number of viral copies (<250 copies / mL). A negative result must be combined with clinical observations, patient history, and epidemiological information.  Fact Sheet for Patients:   11/04/19  Fact Sheet for Healthcare Providers: BoilerBrush.com.cy  This test is not yet approved or  cleared by the https://pope.com/ FDA and has been authorized for detection and/or diagnosis of SARS-CoV-2 by FDA under an Emergency Use Authorization (EUA).  This EUA will remain in effect (meaning this test can be used) for the duration of the COVID-19 declaration under Section 564(b)(1) of the Act, 21 U.S.C. section 360bbb-3(b)(1), unless  the authorization is terminated or revoked sooner.  Performed at [facility], [address], [place], [place] [address]   SARS Coronavirus 2 by RT PCR (hospital order, performed in [facility] hospital lab) Nasopharyngeal Nasopharyngeal Swab     Status: None   Collection Time: 11/05/19 10:07 PM   Specimen: Nasopharyngeal Swab  Result Value Ref Range Status   SARS Coronavirus 2 NEGATIVE NEGATIVE Final    Comment: (NOTE) SARS-CoV-2 target nucleic acids are NOT DETECTED.  The SARS-CoV-2 RNA is generally detectable in upper and lower respiratory specimens during the acute phase of infection. The lowest concentration of SARS-CoV-2 viral copies this assay can detect is 250 copies / mL. A negative result does not preclude SARS-CoV-2 infection and should not be used as the  sole basis for treatment or other patient management decisions.  A negative result may occur with improper specimen collection / handling, submission of specimen other than nasopharyngeal swab, presence of viral mutation(s) within the areas targeted by this assay, and inadequate number of viral copies (<250 copies / mL). A negative result must be combined with clinical observations, patient history, and epidemiological information.  Fact Sheet for Patients:   BoilerBrush.com.cy  Fact Sheet for Healthcare Providers: https://pope.com/  This test is not yet approved or  cleared by the [place] FDA and has been authorized for detection and/or diagnosis of SARS-CoV-2 by FDA under an Emergency Use Authorization (EUA).  This EUA will remain in effect (meaning this test can be used) for the duration of the COVID-19 declaration under Section 564(b)(1) of the Act, 21 U.S.C. section 360bbb-3(b)(1), unless the authorization is terminated or revoked sooner.  Performed at [facility], [address], [place], [place] [address]      Labs: BNP (last 3 results) Recent Labs    04/13/19 0640 07/23/19 2346 09/06/19 1414  BNP 27.1 16.9 32.2   Basic Metabolic Panel: Recent Labs  Lab 11/05/19 2101 11/05/19 2223 11/07/19 0546 11/08/19 0300 11/10/19 0412  NA 135 133* 135 133* 134*  K 3.6 3.7 4.7 4.5 4.4  CL 86*  --  86* 86* 85*  CO2 36*  --  40* 39* 40*  GLUCOSE 117*  --  112* 118* 133*  BUN 19  --  28* 35* 31*  CREATININE 0.71  --  0.84 0.88 0.96  CALCIUM 9.4  --  9.5 9.4 9.3   Liver Function Tests: Recent Labs  Lab 11/05/19 2101  AST 25  ALT 20  ALKPHOS 54  BILITOT 0.6  PROT 7.9  ALBUMIN 4.2   No results for input(s): LIPASE, AMYLASE in the last 168 hours. No results for input(s): AMMONIA in the last 168 hours. CBC: Recent Labs  Lab 11/05/19 2101 11/05/19 2223 11/10/19 0412  WBC 7.3  --  7.2  NEUTROABS 6.0   --   --   HGB 13.1 14.3 11.9*  HCT 40.8 42.0 37.0  MCV 89.5  --  90.5  PLT 246  --  256   Cardiac Enzymes: No results for input(s): CKTOTAL, CKMB, CKMBINDEX, TROPONINI in the last 168 hours. BNP: Invalid input(s): POCBNP CBG: No results for input(s): GLUCAP in the last 168 hours. D-Dimer No results for input(s): DDIMER in the last 72 hours. Hgb A1c No results for input(s): HGBA1C in the last 72 hours. Lipid Profile No results for input(s): CHOL, HDL, LDLCALC, TRIG, CHOLHDL, LDLDIRECT in the last 72 hours. Thyroid function studies No results for input(s): TSH, T4TOTAL, T3FREE, THYROIDAB in the  last 72 hours.  Invalid input(s): FREET3 Anemia work up No results for input(s): VITAMINB12, FOLATE, FERRITIN, TIBC, IRON, RETICCTPCT in the last 72 hours. Urinalysis    Component Value Date/Time   COLORURINE YELLOW 11/12/2008 1440   APPEARANCEUR CLEAR 11/12/2008 1440   LABSPEC 1.016 11/12/2008 1440   PHURINE 7.5 11/12/2008 1440   GLUCOSEU NEGATIVE 11/12/2008 1440   HGBUR TRACE (A) 11/12/2008 1440   BILIRUBINUR NEGATIVE 11/12/2008 1440   KETONESUR NEGATIVE 11/12/2008 1440   PROTEINUR NEGATIVE 11/12/2008 1440   UROBILINOGEN 1.0 11/12/2008 1440   NITRITE NEGATIVE 11/12/2008 1440   LEUKOCYTESUR SMALL (A) 11/12/2008 1440   Sepsis Labs Invalid input(s): PROCALCITONIN,  WBC,  LACTICIDVEN Microbiology Recent Results (from the past 240 hour(s))  SARS Coronavirus 2 by RT PCR (hospital order, performed in [facility] hospital lab) Nasopharyngeal Nasopharyngeal Swab     Status: None   Collection Time: 11/02/19 11:31 PM   Specimen: Nasopharyngeal Swab  Result Value Ref Range Status   SARS Coronavirus 2 NEGATIVE NEGATIVE Final    Comment: (NOTE) SARS-CoV-2 target nucleic acids are NOT DETECTED.  The SARS-CoV-2 RNA is generally detectable in upper and lower respiratory specimens during the acute phase of infection. The lowest concentration of SARS-CoV-2 viral copies this assay can detect  is 250 copies / mL. A negative result does not preclude SARS-CoV-2 infection and should not be used as the sole basis for treatment or other patient management decisions.  A negative result may occur with improper specimen collection / handling, submission of specimen other than nasopharyngeal swab, presence of viral mutation(s) within the areas targeted by this assay, and inadequate number of viral copies (<250 copies / mL). A negative result must be combined with clinical observations, patient history, and epidemiological information.  Fact Sheet for Patients:   BoilerBrush.com.cy  Fact Sheet for Healthcare Providers: https://pope.com/  This test is not yet approved or  cleared by the [place] FDA and has been authorized for detection and/or diagnosis of SARS-CoV-2 by FDA under an Emergency Use Authorization (EUA).  This EUA will remain in effect (meaning this test can be used) for the duration of the COVID-19 declaration under Section 564(b)(1) of the Act, 21 U.S.C. section 360bbb-3(b)(1), unless the authorization is terminated or revoked sooner.  Performed at [facility], [address] [place], [place] [address]   SARS Coronavirus 2 by RT PCR (hospital order, performed in [facility] hospital lab) Nasopharyngeal Nasopharyngeal Swab     Status: None   Collection Time: 11/05/19 10:07 PM   Specimen: Nasopharyngeal Swab  Result Value Ref Range Status   SARS Coronavirus 2 NEGATIVE NEGATIVE Final    Comment: (NOTE) SARS-CoV-2 target nucleic acids are NOT DETECTED.  The SARS-CoV-2 RNA is generally detectable in upper and lower respiratory specimens during the acute phase of infection. The lowest concentration of SARS-CoV-2 viral copies this assay can detect is 250 copies / mL. A negative result does not preclude SARS-CoV-2 infection and should not be used as the sole basis for treatment or other patient  management decisions.  A negative result may occur with improper specimen collection / handling, submission of specimen other than nasopharyngeal swab, presence of viral mutation(s) within the areas targeted by this assay, and inadequate number of viral copies (<250 copies / mL). A negative result must be combined with clinical observations, patient history, and epidemiological information.  Fact Sheet for Patients:   BoilerBrush.com.cy  Fact Sheet for Healthcare Providers: https://pope.com/  This test is not yet approved  or  cleared by the [place] FDA and has been authorized for detection and/or diagnosis of SARS-CoV-2 by FDA under an Emergency Use Authorization (EUA).  This EUA will remain in effect (meaning this test can be used) for the duration of the COVID-19 declaration under Section 564(b)(1) of the Act, 21 U.S.C. section 360bbb-3(b)(1), unless the authorization is terminated or revoked sooner.  Performed at [facility], [address] [place], [place] [id number]      Time coordinating discharge: 35 minutes  SIGNED:   Erick BlinksPratik D Norene Oliveri, DO [facility] 11/12/2019, 9:49 AM  If 7PM-7AM, please contact night-coverage [email or web address]

## 2019-11-15 ENCOUNTER — Telehealth: Payer: Self-pay | Admitting: Emergency Medicine

## 2019-11-15 ENCOUNTER — Encounter: Payer: Self-pay | Admitting: Emergency Medicine

## 2019-11-15 NOTE — Telephone Encounter (Signed)
Called patient, sats 98% on room air, patient instructed not to turn up oxygen unless sats drop below 88%. Patient also advised that she could use saline nasal spray to help with the dryness. Patient verbalized understanding of education. Nothing further needed.

## 2019-11-17 ENCOUNTER — Telehealth: Payer: Self-pay | Admitting: Emergency Medicine

## 2019-11-17 NOTE — Telephone Encounter (Signed)
No answer - will try again later

## 2019-11-17 NOTE — Telephone Encounter (Signed)
Kathleen Cameron with  [facility]  Calling,needing a verbal   PT  order for frequency of 1 week 1 and 2 week 1 and 1 week 1 a tolal of 4 visits   [phone]

## 2019-11-18 ENCOUNTER — Encounter: Payer: Self-pay | Admitting: Emergency Medicine

## 2019-11-18 NOTE — Telephone Encounter (Signed)
Kathleen Cameron was given the verbal order for PT listed below

## 2019-11-22 ENCOUNTER — Encounter: Payer: Self-pay | Admitting: Emergency Medicine

## 2019-11-22 ENCOUNTER — Ambulatory Visit (INDEPENDENT_AMBULATORY_CARE_PROVIDER_SITE_OTHER): Payer: No Typology Code available for payment source | Admitting: Emergency Medicine

## 2019-11-22 ENCOUNTER — Other Ambulatory Visit: Payer: Self-pay

## 2019-11-22 VITALS — BP 140/82 | HR 65 | Temp 98.3°F | Resp 15 | Ht 61.0 in | Wt 104.2 lb

## 2019-11-22 DIAGNOSIS — F321 Major depressive disorder, single episode, moderate: Secondary | ICD-10-CM

## 2019-11-22 DIAGNOSIS — J439 Emphysema, unspecified: Secondary | ICD-10-CM | POA: Diagnosis not present

## 2019-11-22 DIAGNOSIS — I10 Essential (primary) hypertension: Secondary | ICD-10-CM | POA: Diagnosis not present

## 2019-11-22 DIAGNOSIS — Z09 Encounter for follow-up examination after completed treatment for conditions other than malignant neoplasm: Secondary | ICD-10-CM

## 2019-11-22 NOTE — Patient Instructions (Addendum)
If you have lab work done today you will be contacted with your lab results within the next 2 weeks.  If you have not heard from [place] then please contact us. The fastest way to get your results is to register for My Chart.   IF you received an x-ray today, you will receive an invoice from [facility]. Please contact [facility] at [phone] with questions or concerns regarding your invoice.   IF you received labwork today, you will receive an invoice from [facility]. Please contact [facility] at [phone] with questions or concerns regarding your invoice.   Our billing staff will not be able to assist you with questions regarding bills from these companies.  You will be contacted with the lab results as soon as they are available. The fastest way to get your results is to activate your My Chart account. Instructions are located on the last page of this paperwork. If you have not heard from [place] regarding the results in 2 weeks, please contact this office.     Chronic Obstructive Pulmonary Disease Chronic obstructive pulmonary disease (COPD) is a long-term (chronic) lung problem. When you have COPD, it is hard for air to get in and out of your lungs. Usually the condition gets worse over time, and your lungs will never return to normal. There are things you can do to keep yourself as healthy as possible.  Your doctor may treat your condition with: ? Medicines. ? Oxygen. ? Lung surgery.  Your doctor may also recommend: ? Rehabilitation. This includes steps to make your body work better. It may involve a team of specialists. ? Quitting smoking, if you smoke. ? Exercise and changes to your diet. ? Comfort measures (palliative care). Follow these instructions at home: Medicines  Take over-the-counter and prescription medicines only as told by your doctor.  Talk to your doctor before taking any cough or allergy medicines. You may need to avoid medicines that cause  your lungs to be dry. Lifestyle  If you smoke, stop. Smoking makes the problem worse. If you need help quitting, ask your doctor.  Avoid being around things that make your breathing worse. This may include smoke, chemicals, and fumes.  Stay active, but remember to rest as well.  Learn and use tips on how to relax.  Make sure you get enough sleep. Most adults need at least 7 hours of sleep every night.  Eat healthy foods. Eat smaller meals more often. Rest before meals. Controlled breathing Learn and use tips on how to control your breathing as told by your doctor. Try:  Breathing in (inhaling) through your nose for 1 second. Then, pucker your lips and breath out (exhale) through your lips for 2 seconds.  Putting one hand on your belly (abdomen). Breathe in slowly through your nose for 1 second. Your hand on your belly should move out. Pucker your lips and breathe out slowly through your lips. Your hand on your belly should move in as you breathe out.  Controlled coughing Learn and use controlled coughing to clear mucus from your lungs. Follow these steps: 1. Lean your head a little forward. 2. Breathe in deeply. 3. Try to hold your breath for 3 seconds. 4. Keep your mouth slightly open while coughing 2 times. 5. Spit any mucus out into a tissue. 6. Rest and do the steps again 1 or 2 times as needed. General instructions  Make sure you get all the shots (vaccines) that your doctor recommends. Ask  your doctor about a flu shot and a pneumonia shot.  Use oxygen therapy and pulmonary rehabilitation if told by your doctor. If you need home oxygen therapy, ask your doctor if you should buy a tool to measure your oxygen level (oximeter).  Make a COPD action plan with your doctor. This helps you to know what to do if you feel worse than usual.  Manage any other conditions you have as told by your doctor.  Avoid going outside when it is very hot, cold, or humid.  Avoid people who have  a sickness you can catch (contagious).  Keep all follow-up visits as told by your doctor. This is important. Contact a doctor if:  You cough up more mucus than usual.  There is a change in the color or thickness of the mucus.  It is harder to breathe than usual.  Your breathing is faster than usual.  You have trouble sleeping.  You need to use your medicines more often than usual.  You have trouble doing your normal activities such as getting dressed or walking around the house. Get help right away if:  You have shortness of breath while resting.  You have shortness of breath that stops you from: ? Being able to talk. ? Doing normal activities.  Your chest hurts for longer than 5 minutes.  Your skin color is more blue than usual.  Your pulse oximeter shows that you have low oxygen for longer than 5 minutes.  You have a fever.  You feel too tired to breathe normally. Summary  Chronic obstructive pulmonary disease (COPD) is a long-term lung problem.  The way your lungs work will never return to normal. Usually the condition gets worse over time. There are things you can do to keep yourself as healthy as possible.  Take over-the-counter and prescription medicines only as told by your doctor.  If you smoke, stop. Smoking makes the problem worse. This information is not intended to replace advice given to you by your health care provider. Make sure you discuss any questions you have with your health care provider. Document Revised: 03/20/2017 Document Reviewed: 05/12/2016 Elsevier Patient Education  2020 [employer].

## 2019-11-22 NOTE — Progress Notes (Signed)
Kathleen Cameron 54 y.o.   Chief Complaint  Patient presents with   Depression     Pt has been doing well has not felt down or depressed recently PHQ-9=1   Anxiety    pt notes the new medication has been helping her apetite has increased and she feels calmer   GAD7=3   Physician Discharge Summary  Kathleen Cameron FMB:[id number] DOB: 1965/10/22 DOA: 11/05/2019  PCP: [name], MD  Admit date: 11/05/2019  Discharge date: 11/12/2019  Admitted From:Home  Disposition:  Home  Recommendations for Outpatient Follow-up:  1. Follow up with PCP in 1-2 weeks 2. Please obtain BMP/CBC in one week 3. Xanax prescribed as needed for anxiety concerns with # 20 tablets and 0 refills 4. Continue on prednisone 40 mg daily as prescribed for 5 days 5. Home breathing treatments to be used as needed, patient does have duo nebs  Home Health: Yes with PT  Equipment/Devices: Patient has chronic 4 L nasal cannula oxygen at home, 3 n 1  Discharge Condition: Stable  CODE STATUS: Full  Diet recommendation: Heart Healthy  Brief/Interim Summary: 54 year old female with past medical history for COPD, chronic hypoxic respiratory failure, hypertensionanddyslipidemia. She reported several weeks of dyspnea, progressive symptoms to the point where she becamedyspneic with minimal activities. Positive wheezing and decreased p.o. intake. She was seen in the ED July 14, diagnosed with COPD exacerbation, prescribed Augmentin and prednisone. On her initial physical examination blood pressure 147/87, heart rate 78, respiratory 26, temperature 97.7, oxygen saturation 99% on supplemental oxygen. Her lungs have decreased breath sounds bilaterally, poor air movement, heart S1-S2, present rhythmic, abdomen soft, no lower extremity edema. Sodium 135, potassium 3.6, chloride 86, bicarb 36, glucose 117, BUN 19, creatinine 0.71, white count 7.3, hemoglobin 13.1, hematocrit 40.9, platelets  246. SARS COVID-19 negative. Venous blood gas pH 7.30, PCO2 80, PO2 77, bicarb 46. Chest radiograph with hyperinflation. EKG 69 bpm, normal axis, normal intervals, QTc manually corrected 483, sinus rhythm, poor R wave progression, no ST segment or T wave changes.  1. Acute on chronic hypoxic and hypercapnic respiratory failure due to COPD exacerbation. Present on admission. -Currently resolved and patient on baseline 4 L nasal cannula oxygen -Patient has completed 5 days of azithromycin -Continue home breathing treatments as needed for shortness of breath or wheezing -Continue on prednisone 40 mg daily as prescribed for the next 5 days  2. HTN.Continue withamlodipine, hctz and bisoprolol, for blood pressure control.  3. Dyslipidemia.Continue withrosuvastatin.  4. Depression.Continue withescitalopram.  5. GERD. Patient with abdominal pain, radiated to the chest, worse with meals. -Continue home PPI and Maalox as needed  Discharge Diagnoses:  Principal Problem:   Acute on chronic respiratory failure with hypoxia and hypercapnia (HCC) Active Problems:   Essential hypertension   Dyslipidemia   COPD exacerbation (HCC)   Depression   Respiratory failure (HCC)  Principal discharge diagnosis: Acute on chronic combined respiratory failure secondary to COPD exacerbation.  HISTORY OF PRESENT ILLNESS: This is a 54 y.o. female with history of advanced COPD recently admitted to the hospital from 11/05/2019 to 11/12/2019. Discharge summary reviewed. Is also going through depression/anxiety associated with her chronic medical condition.  Lexapro and Xanax helping a great deal. Much better with much improved depression and anxiety screening scores. No concerns or complaints today.  HPI   Prior to Admission medications   Medication Sig Start Date End Date Taking? Authorizing Provider  acetaminophen (TYLENOL) 500 MG tablet Take 500 mg by mouth daily as needed for  mild pain or  headache.   Yes [provider]  ALPRAZolam (XANAX) 0.25 MG tablet Take 1 tablet (0.25 mg total) by mouth 3 (three) times daily as needed for anxiety. 11/12/19  Yes Shah, Pratik D, DO  alum & mag hydroxide-simeth (MAALOX/MYLANTA) 200-200-20 MG/5ML suspension Take 15 mLs by mouth every 6 (six) hours as needed for indigestion or heartburn. 11/12/19  Yes Sherryll Burger, Pratik D, DO  aspirin EC 81 MG EC tablet Take 1 tablet (81 mg total) by mouth daily. 04/17/19  Yes [name], MD  bisoprolol (ZEBETA) 10 MG tablet Take 1 tablet (10 mg total) by mouth daily. 10/20/19  Yes Eryca Bolte, Eilleen Kempf, MD  Budeson-Glycopyrrol-Formoterol (BREZTRI AEROSPHERE) 160-9-4.8 MCG/ACT AERO Inhale 2 puffs into the lungs in the morning and at bedtime. 09/27/19  Yes Leslye Peer, MD  escitalopram (LEXAPRO) 20 MG tablet Take 1 tablet (20 mg total) by mouth daily. 10/20/19 01/18/20 Yes Malie Kashani, Eilleen Kempf, MD  guaiFENesin (MUCINEX) 600 MG 12 hr tablet Take 600 mg by mouth daily as needed for cough or to loosen phlegm.   Yes [provider]  hydrochlorothiazide (HYDRODIURIL) 25 MG tablet Take 25 mg by mouth daily. 09/07/19  Yes [provider]  ipratropium-albuterol (DUONEB) 0.5-2.5 (3) MG/3ML SOLN Take 3 mLs by nebulization every 6 (six) hours as needed. Patient taking differently: Take 3 mLs by nebulization every 6 (six) hours as needed (Shortness of Breath).  05/03/19  Yes [name], NP  nitroGLYCERIN (NITROSTAT) 0.4 MG SL tablet Place 1 tablet (0.4 mg total) under the tongue every 5 (five) minutes as needed for chest pain. 04/17/19  Yes [name], MD  pantoprazole (PROTONIX) 40 MG tablet Take 1 tablet (40 mg total) by mouth daily. 10/27/19  Yes Kyair Ditommaso, Eilleen Kempf, MD  rosuvastatin (CRESTOR) 20 MG tablet Take 1 tablet (20 mg total) by mouth daily. 10/20/19 01/18/20 Yes Caidance Sybert, Eilleen Kempf, MD  amLODipine (NORVASC) 5 MG tablet Take 1 tablet (5 mg total) by mouth daily. 08/09/19 11/07/19  [name], MD    Allergies  Allergen Reactions   Stiolto Respimat [Tiotropium Bromide-Olodaterol] Other (See Comments)    Severe headaches and rapid heartrate    Patient Active Problem List   Diagnosis Date Noted   COPD exacerbation (HCC) 11/06/2019   Depression 11/06/2019   Acute on chronic respiratory failure with hypoxia and hypercapnia (HCC) 11/06/2019   Respiratory failure (HCC) 11/06/2019   Current moderate episode of major depressive disorder without prior episode (HCC) 09/20/2019   Pedal edema 08/04/2019   Chronic respiratory failure with hypoxia (HCC) 03/07/2019   CKD (chronic kidney disease) 01/05/2019   COPD (chronic obstructive pulmonary disease) (HCC) 09/29/2018   Pulmonary nodules 09/27/2018   Dyslipidemia 07/07/2018   Essential hypertension 03/10/2018    Past Medical History:  Diagnosis Date   Asthma    Hypertension     No past surgical history on file.  Social History   Socioeconomic History   Marital status: Single    Spouse name: Not on file   Number of children: Not on file   Years of education: Not on file   Highest education level: Not on file  Occupational History   Not on file  Tobacco Use   Smoking status: Never Smoker   Smokeless tobacco: Never Used  Substance and Sexual Activity   Alcohol use: Yes    Alcohol/week: 2.0 standard drinks    Types: 2 Cans of beer per week   Drug use: Never  Sexual activity: Not on file  Other Topics Concern   Not on file  Social History Narrative   Not on file   Social Determinants of Health   Financial Resource Strain:    Difficulty of Paying Living Expenses:   Food Insecurity:    Worried About Running Out of Food in the Last Year:    Barista in the Last Year:   Transportation Needs:    Freight forwarder (Medical):    Lack of Transportation (Non-Medical):   Physical Activity:    Days of Exercise per Week:    Minutes of Exercise per Session:     Stress:    Feeling of Stress :   Social Connections:    Frequency of Communication with Friends and Family:    Frequency of Social Gatherings with Friends and Family:    Attends Religious Services:    Active Member of Clubs or Organizations:    Attends Engineer, structural:    Marital Status:   Intimate Partner Violence:    Fear of Current or Ex-Partner:    Emotionally Abused:    Physically Abused:    Sexually Abused:     Family History  Family history unknown: Yes     Review of Systems  Constitutional: Negative.   HENT: Negative.  Negative for congestion and sore throat.   Respiratory: Negative for cough and shortness of breath.   Cardiovascular: Negative.  Negative for chest pain and palpitations.  Gastrointestinal: Negative for abdominal pain, diarrhea and nausea.  Genitourinary: Negative.  Negative for dysuria and hematuria.  Skin: Negative.  Negative for rash.  Neurological: Negative for dizziness and headaches.  All other systems reviewed and are negative.     Today's Vitals   11/22/19 1046 11/22/19 1058  BP: (!) 163/96 140/82  Pulse: 65   Resp: 15   Temp: 98.3 F (36.8 C)   TempSrc: Temporal   SpO2: 95%   Weight: 104 lb 3.2 oz (47.3 kg)   Height:  (1.549 m)    Body mass index is 19.69 kg/m.  Physical Exam Vitals reviewed.  Constitutional:      Appearance: Normal appearance.     Comments: In a wheelchair with continuous oxygen on  HENT:     Head: Normocephalic.  Eyes:     Extraocular Movements: Extraocular movements intact.     Pupils: Pupils are equal, round, and reactive to light.  Cardiovascular:     Rate and Rhythm: Normal rate and regular rhythm.     Pulses: Normal pulses.     Heart sounds: Normal heart sounds.  Pulmonary:     Effort: Pulmonary effort is normal.     Breath sounds: Normal breath sounds.  Musculoskeletal:     Cervical back: Normal range of motion and neck supple.  Skin:    General: Skin is warm  and dry.     Capillary Refill: Capillary refill takes less than 2 seconds.  Neurological:     General: No focal deficit present.     Mental Status: She is alert and oriented to person, place, and time.  Psychiatric:        Mood and Affect: Mood normal.        Behavior: Behavior normal.      ASSESSMENT & PLAN: Clinically stable.  No medical concerns identified during this visit.  Continue present medications.  Follow-up in 3 months.  Elizabelle was seen today for depression and anxiety.  Diagnoses and all orders for  this visit:  Current moderate episode of major depressive disorder without prior episode [facility](HCC) Comments: Much improved  Pulmonary emphysema, unspecified emphysema type [facility](HCC)  Hospital discharge follow-up  Essential hypertension    Patient Instructions       If you have lab work done today you will be contacted with your lab results within the next 2 weeks.  If you have not heard from us then please contact us. The fastest way to get your results is to register for My Chart.   IF you received an x-ray today, you will receive an [facility]. Please contact [facility] at [phone] with questions or concerns regarding your invoice.   IF you received labwork today, you will receive an invoice from [facility]. Please contact [facility] at [phone] with questions or concerns regarding your invoice.   Our billing staff will not be able to assist you with questions regarding bills from these companies.  You will be contacted with the lab results as soon as they are available. The fastest way to get your results is to activate your My Chart account. Instructions are located on the last page of this paperwork. If you have not heard from us regarding the results in 2 weeks, please contact this office.     Chronic Obstructive Pulmonary Disease Chronic obstructive pulmonary disease (COPD) is a long-term (chronic) lung problem. When  you have COPD, it is hard for air to get in and out of your lungs. Usually the condition gets worse over time, and your lungs will never return to normal. There are things you can do to keep yourself as healthy as possible.  Your doctor may treat your condition with: ? Medicines. ? Oxygen. ? Lung surgery.  Your doctor may also recommend: ? Rehabilitation. This includes steps to make your body work better. It may involve a team of specialists. ? Quitting smoking, if you smoke. ? Exercise and changes to your diet. ? Comfort measures (palliative care). Follow these instructions at home: Medicines  Take over-the-counter and prescription medicines only as told by your doctor.  Talk to your doctor before taking any cough or allergy medicines. You may need to avoid medicines that cause your lungs to be dry. Lifestyle  If you smoke, stop. Smoking makes the problem worse. If you need help quitting, ask your doctor.  Avoid being around things that make your breathing worse. This may include smoke, chemicals, and fumes.  Stay active, but remember to rest as well.  Learn and use tips on how to relax.  Make sure you get enough sleep. Most adults need at least 7 hours of sleep every night.  Eat healthy foods. Eat smaller meals more often. Rest before meals. Controlled breathing Learn and use tips on how to control your breathing as told by your doctor. Try:  Breathing in (inhaling) through your nose for 1 second. Then, pucker your lips and breath out (exhale) through your lips for 2 seconds.  Putting one hand on your belly (abdomen). Breathe in slowly through your nose for 1 second. Your hand on your belly should move out. Pucker your lips and breathe out slowly through your lips. Your hand on your belly should move in as you breathe out.  Controlled coughing Learn and use controlled coughing to clear mucus from your lungs. Follow these steps: 1. Lean your head a little forward. 2. Breathe  in deeply. 3. Try to hold your breath for 3 seconds. 4. Keep your mouth slightly open while  coughing 2 times. 5. Spit any mucus out into a tissue. 6. Rest and do the steps again 1 or 2 times as needed. General instructions  Make sure you get all the shots (vaccines) that your doctor recommends. Ask your doctor about a flu shot and a pneumonia shot.  Use oxygen therapy and pulmonary rehabilitation if told by your doctor. If you need home oxygen therapy, ask your doctor if you should buy a tool to measure your oxygen level (oximeter).  Make a COPD action plan with your doctor. This helps you to know what to do if you feel worse than usual.  Manage any other conditions you have as told by your doctor.  Avoid going outside when it is very hot, cold, or humid.  Avoid people who have a sickness you can catch (contagious).  Keep all follow-up visits as told by your doctor. This is important. Contact a doctor if:  You cough up more mucus than usual.  There is a change in the color or thickness of the mucus.  It is harder to breathe than usual.  Your breathing is faster than usual.  You have trouble sleeping.  You need to use your medicines more often than usual.  You have trouble doing your normal activities such as getting dressed or walking around the house. Get help right away if:  You have shortness of breath while resting.  You have shortness of breath that stops you from: ? Being able to talk. ? Doing normal activities.  Your chest hurts for longer than 5 minutes.  Your skin color is more blue than usual.  Your pulse oximeter shows that you have low oxygen for longer than 5 minutes.  You have a fever.  You feel too tired to breathe normally. Summary  Chronic obstructive pulmonary disease (COPD) is a long-term lung problem.  The way your lungs work will never return to normal. Usually the condition gets worse over time. There are things you can do to keep yourself  as healthy as possible.  Take over-the-counter and prescription medicines only as told by your doctor.  If you smoke, stop. Smoking makes the problem worse. This information is not intended to replace advice given to you by your health care provider. Make sure you discuss any questions you have with your health care provider. Document Revised: 03/20/2017 Document Reviewed: 05/12/2016 Elsevier Patient Education  2020 [employer]    Edwina Barth, MD [facility]

## 2019-11-23 ENCOUNTER — Encounter: Payer: Self-pay | Admitting: Emergency Medicine

## 2019-11-23 DIAGNOSIS — I10 Essential (primary) hypertension: Secondary | ICD-10-CM

## 2019-11-23 DIAGNOSIS — J439 Emphysema, unspecified: Secondary | ICD-10-CM

## 2019-11-23 DIAGNOSIS — N1832 Chronic kidney disease, stage 3b: Secondary | ICD-10-CM

## 2019-11-24 NOTE — Telephone Encounter (Signed)
It is supposed to help humidify the air you breathe in.

## 2019-11-25 NOTE — Telephone Encounter (Signed)
Pt can no longer have her husband as her caregiver and is requesting a new order for home health I do not see the previous one

## 2019-11-26 NOTE — Telephone Encounter (Signed)
I think the original order was placed in the hospital but we can request home health care services from our office.  She needs this. Thanks.

## 2019-11-29 ENCOUNTER — Other Ambulatory Visit: Payer: Self-pay

## 2019-11-29 NOTE — Telephone Encounter (Signed)
I have pended an order but I do not know what specifications to put in and do not want this to be prolonged by a mistake would you please sign the order and ensure everything is correct

## 2019-11-30 ENCOUNTER — Other Ambulatory Visit: Payer: Self-pay | Admitting: *Deleted

## 2019-11-30 ENCOUNTER — Other Ambulatory Visit: Payer: Self-pay | Admitting: Emergency Medicine

## 2019-11-30 DIAGNOSIS — J9611 Chronic respiratory failure with hypoxia: Secondary | ICD-10-CM

## 2019-11-30 DIAGNOSIS — J441 Chronic obstructive pulmonary disease with (acute) exacerbation: Secondary | ICD-10-CM

## 2019-11-30 DIAGNOSIS — J439 Emphysema, unspecified: Secondary | ICD-10-CM

## 2019-11-30 DIAGNOSIS — J9621 Acute and chronic respiratory failure with hypoxia: Secondary | ICD-10-CM

## 2019-11-30 DIAGNOSIS — J9622 Acute and chronic respiratory failure with hypercapnia: Secondary | ICD-10-CM

## 2019-11-30 NOTE — Telephone Encounter (Signed)
Orders completed and signed. Thanks for your help.

## 2019-12-02 ENCOUNTER — Encounter: Payer: Self-pay | Admitting: Primary Care

## 2019-12-02 ENCOUNTER — Ambulatory Visit (INDEPENDENT_AMBULATORY_CARE_PROVIDER_SITE_OTHER): Payer: Commercial Managed Care - PPO | Admitting: Primary Care

## 2019-12-02 ENCOUNTER — Other Ambulatory Visit: Payer: Self-pay

## 2019-12-02 DIAGNOSIS — J9611 Chronic respiratory failure with hypoxia: Secondary | ICD-10-CM

## 2019-12-02 DIAGNOSIS — F419 Anxiety disorder, unspecified: Secondary | ICD-10-CM | POA: Diagnosis not present

## 2019-12-02 DIAGNOSIS — J438 Other emphysema: Secondary | ICD-10-CM

## 2019-12-02 MED ORDER — ALPRAZOLAM 0.25 MG PO TABS: 0.2500 mg | ORAL_TABLET | Freq: Three times a day (TID) | ORAL | 0 refills | Status: DC | PRN

## 2019-12-02 MED ORDER — BREZTRI AEROSPHERE 160-9-4.8 MCG/ACT IN AERO
2.0000 | INHALATION_SPRAY | Freq: Two times a day (BID) | RESPIRATORY_TRACT | 0 refills | Status: DC
Start: 2019-12-02 — End: 2020-03-08

## 2019-12-02 NOTE — Assessment & Plan Note (Addendum)
-   Patient has had two admissions this year for COPD exacerbation. Most recent in July 2021 treated with azithromycin/prednisone taper. CXR showed stable pulmonary hyperinflation. She is doing well, breathing has been stable since discharge. She is tolerating Breztri two puffs twice daily well. She has not needed to use Duo neb treatment in over 2 weeks. Strongly encouraged patient to get covid-19 vaccination, she is considering. No changes today. FU in 3-4 months with Dr. [name].

## 2019-12-02 NOTE — Assessment & Plan Note (Signed)
-   Continue 2L oxygen 24/7 to maintain O2 >88-90%

## 2019-12-02 NOTE — Patient Instructions (Addendum)
Nice seeing you today Ms Kathleen Cameron  Recommendations: Continue Breztri two puffs twice daily Continue Duoneb every 6 hours as needed for shortness of breath/wheezing Please consider getting covid-19 vaccination   Orders: Patients family member need work note to be out of work today 12/02/2019 for Ms Kathleen Cameron medical appointment  Rx: Xanax 0.25 three times a day as needed (one week supply given, will need PCP to refill after)  Follow-up: 3-4 months with Dr. [name]

## 2019-12-02 NOTE — Assessment & Plan Note (Signed)
-   Maintained on Lexapro 20mg  daily - She was given RX xanax 0.25mg  TID for anxiety upon discharge from hospital. PMP reviewed, overdose risk score is 000. Ok to refill 1 week supply and patient to follow-up with PCP for further refills.

## 2019-12-02 NOTE — Progress Notes (Signed)
@Patient  ID: , female    DOB: 1965-09-25, 54 y.o.   MRN: [id number]  Chief Complaint  Patient presents with  . Follow-up    no complaints    Referring provider: [id number], *  HPI: 54 year old female, never smoked. PMH significant for COPD, NSTEMI, HTN, CKD, dyslipidemia, pulmonary nodules. Patient of Dr. 59. Needs repeat CT chest in December 2021. FU in 3 months.   Previous [facility] pulmonary encounters: 4/15/214/17/21, NP Recent hospital admission 4/3-4/4 for COPD exacerbation. Discharged with prednsione taper 4 days. CXR hyperinflated lungs bilaterally. Stable nodular opactities seen right upper lobe. BNP was normal.   Patient reports increased shortness of breath x 1 week. Associated pedal edema. She states that this morning she woke up breathing "real hard". She used her ventolin rescue inhaler and has been fine ever since. She has noticed that her feet have been more swollen recently. She is on 2L oxygen. She is not consistently using Brezteri, she will be getting medication throught drug company with AZD and will be deliveried to her house. Denies cough, nasal congestin, chest tightness or wheezing.  12/02/2019-Interim history Patient presents today for 68-month follow-up.  Since last visit she has been seen in the emergency room twice with one hospital admission in July for COPD exacerbation.  She was originally treated with a course of Augmentin.  She completed 5 days of azithromycin and was discharged home on 40 mg prednisone daily for 5 days.  She is to continue home breathing treatments as needed for shortness of breath and wheezing.  She remains on baseline 2 L oxygen.  She is doing well today, no acute symptoms. She remains on Breztri 2 puffs twice daily. Her breathing has been stable since discharged. She has not needed to use duoneb treatment in the last two weeks. She reports having an asthma like reaction to pulmicort nebulizer while admitted to the  hospital. She was given prescription for xanax 0.25mg  as needed for anxiety when she left the hospital. She is requesting a refill.  Hospital admissions 07/23/19- COPD exacerbation 11/05/19- COPD exacerbation   Allergies  Allergen Reactions  . Stiolto Respimat [Tiotropium Bromide-Olodaterol] Other (See Comments)    Severe headaches and rapid heartrate    Immunization History  Administered Date(s) Administered  . Influenza,inj,Quad PF,6+ Mos 01/05/2019  . Pneumococcal Polysaccharide-23 11/09/2019    Past Medical History:  Diagnosis Date  . Asthma   . Hypertension     Tobacco History: Social History   Tobacco Use  Smoking Status Never Smoker  Smokeless Tobacco Never Used   Counseling given: Not Answered   Outpatient Medications Prior to Visit  Medication Sig Dispense Refill  . acetaminophen (TYLENOL) 500 MG tablet Take 500 mg by mouth daily as needed for mild pain or headache.    11/11/2019 alum & mag hydroxide-simeth (MAALOX/MYLANTA) 200-200-20 MG/5ML suspension Take 15 mLs by mouth every 6 (six) hours as needed for indigestion or heartburn. 355 mL 0  . amLODipine (NORVASC) 5 MG tablet Take 1 tablet (5 mg total) by mouth daily. 90 tablet 3  . aspirin EC 81 MG EC tablet Take 1 tablet (81 mg total) by mouth daily. 30 tablet 0  . bisoprolol (ZEBETA) 10 MG tablet Take 1 tablet (10 mg total) by mouth daily. 30 tablet 3  . Budeson-Glycopyrrol-Formoterol (BREZTRI AEROSPHERE) 160-9-4.8 MCG/ACT AERO Inhale 2 puffs into the lungs in the morning and at bedtime. 2 g 0  . escitalopram (LEXAPRO) 20 MG tablet Take 1  tablet (20 mg total) by mouth daily. 90 tablet 1  . guaiFENesin (MUCINEX) 600 MG 12 hr tablet Take 600 mg by mouth daily as needed for cough or to loosen phlegm.    . hydrochlorothiazide (HYDRODIURIL) 25 MG tablet Take 25 mg by mouth daily.    [name] ipratropium-albuterol (DUONEB) 0.5-2.5 (3) MG/3ML SOLN Take 3 mLs by nebulization every 6 (six) hours as needed. (Patient taking differently:  Take 3 mLs by nebulization every 6 (six) hours as needed (Shortness of Breath). ) 360 mL 2  . nitroGLYCERIN (NITROSTAT) 0.4 MG SL tablet Place 1 tablet (0.4 mg total) under the tongue every 5 (five) minutes as needed for chest pain. 30 tablet 0  . pantoprazole (PROTONIX) 40 MG tablet Take 1 tablet (40 mg total) by mouth daily. 30 tablet 3  . rosuvastatin (CRESTOR) 20 MG tablet Take 1 tablet (20 mg total) by mouth daily. 90 tablet 3  . ALPRAZolam (XANAX) 0.25 MG tablet Take 1 tablet (0.25 mg total) by mouth 3 (three) times daily as needed for anxiety. (Patient not taking: Reported on 12/02/2019) 20 tablet 0   No facility-administered medications prior to visit.    Review of Systems  Review of Systems  Constitutional: Negative.   Respiratory: Negative for cough, shortness of breath and wheezing.   Psychiatric/Behavioral: The patient is nervous/anxious.     Physical Exam  BP 118/74 (BP Location: Left Arm, Cuff Size: Normal)   Pulse 70   Temp 98.2 F (36.8 C) (Temporal)   Ht 5' (1.524 m)   Wt 106 lb 9.6 oz (48.4 kg)   SpO2 100% Comment: 2L O2  BMI 20.82 kg/m  Physical Exam Constitutional:      Appearance: Normal appearance.  HENT:     Head: Normocephalic and atraumatic.  Cardiovascular:     Rate and Rhythm: Normal rate and regular rhythm.  Pulmonary:     Effort: Pulmonary effort is normal.     Breath sounds: Normal breath sounds. No wheezing or rhonchi.  Neurological:     Mental Status: She is alert.  Psychiatric:        Mood and Affect: Mood normal.        Behavior: Behavior normal.        Thought Content: Thought content normal.        Judgment: Judgment normal.      Lab Results:  CBC    Component Value Date/Time   WBC 7.2 11/10/2019 0412   RBC 4.09 11/10/2019 0412   HGB 11.9 (L) 11/10/2019 0412   HGB 13.4 09/14/2018 1220   HCT 37.0 11/10/2019 0412   HCT 39.3 09/14/2018 1220   PLT 256 11/10/2019 0412   PLT 303 09/14/2018 1220   MCV 90.5 11/10/2019 0412   MCV  84 09/14/2018 1220   MCH 29.1 11/10/2019 0412   MCHC 32.2 11/10/2019 0412   RDW 13.1 11/10/2019 0412   RDW 12.9 09/14/2018 1220   LYMPHSABS 0.8 11/05/2019 2101   LYMPHSABS 1.7 09/14/2018 1220   MONOABS 0.2 11/05/2019 2101   EOSABS 0.2 11/05/2019 2101   EOSABS 0.3 09/14/2018 1220   BASOSABS 0.0 11/05/2019 2101   BASOSABS 0.1 09/14/2018 1220    BMET    Component Value Date/Time   NA 134 (L) 11/10/2019 0412   NA 138 10/27/2019 1142   K 4.4 11/10/2019 0412   CL 85 (L) 11/10/2019 0412   CO2 40 (H) 11/10/2019 0412   GLUCOSE 133 (H) 11/10/2019 0412   BUN 31 (H) 11/10/2019 6222  BUN 15 10/27/2019 1142   CREATININE 0.96 11/10/2019 0412   CALCIUM 9.3 11/10/2019 0412   GFRNONAA >60 11/10/2019 0412   GFRAA >60 11/10/2019 0412    BNP    Component Value Date/Time   BNP 32.2 09/06/2019 1414   BNP 16.9 07/23/2019 2346    ProBNP    Component Value Date/Time   PROBNP 379.9 (H) 09/18/2013 0400    Imaging: DG Chest Port 1 View  Result Date: 11/09/2019 CLINICAL DATA:  Dyspnea, asthma EXAM: PORTABLE CHEST 1 VIEW COMPARISON:  11/05/2019, 04/13/2019 FINDINGS: Minimal left basilar atelectasis. Lungs remain stably, symmetrically mildly hyperinflated in keeping with changes of underlying COPD. No superimposed focal pulmonary infiltrate. No pneumothorax or pleural effusion. Cardiac size is within normal limits. Prominence of the hila bilaterally stable since multiple prior examinations and simply represents a combination of vascular shadow and calcified hilar adenopathy related to old granulomatous disease. The thoracic aorta is tortuous, but otherwise unremarkable. No acute bone abnormality. IMPRESSION: Stable pulmonary hyperinflation in keeping with underlying COPD. Electronically Signed   By: Helyn Numbers MD   On: 11/09/2019 21:17   DG Chest Port 1 View  Result Date: 11/05/2019 CLINICAL DATA:  SOB EXAM: PORTABLE CHEST 1 VIEW COMPARISON:  Chest radiograph 11/02/2019 FINDINGS: The heart  size and mediastinal contours are within normal limits. Lungs are hyperinflated. Mild streaky opacities in the bilateral mid to lower lungs. Unchanged small nodules in the right upper lung. No new focal consolidation. No pneumothorax or large pleural effusion. No acute finding in the visualized skeleton. IMPRESSION: Mild streaky opacities in the bilateral mid to lower lungs favored to represent atelectasis, similar to prior. Electronically Signed   By: Emmaline Kluver M.D.   On: 11/05/2019 20:29   DG Chest Port 1 View  Result Date: 11/02/2019 CLINICAL DATA:  Shortness.  Respiratory distress. EXAM: PORTABLE CHEST 1 VIEW COMPARISON:  Chest radiograph 07/23/2019. Included portions from cardiac CT 06/08/2019, chest CT 04/13/2019 FINDINGS: Mild chronic hyperinflation. Linear opacities in both mid lower lung zones typical of subsegmental atelectasis or scarring. Right upper lobe nodular densities there pleural based on prior CT. Heart is normal in size. Unchanged mediastinal contours with mild aortic tortuosity. No confluent airspace disease, pulmonary edema, large pleural effusion or pneumothorax. No acute osseous abnormalities are seen. IMPRESSION: 1. Streaky bibasilar atelectasis or scarring. 2. Chronic mild hyperinflation. Electronically Signed   By: Narda Rutherford M.D.   On: 11/02/2019 22:45     Assessment & Plan:   COPD (chronic obstructive pulmonary disease) (HCC) - Patient has had two admissions this year for COPD exacerbation. Most recent in July 2021 treated with azithromycin/prednisone taper. CXR showed stable pulmonary hyperinflation. She is doing well, breathing has been stable since discharge. She is tolerating Breztri two puffs twice daily well. She has not needed to use Duo neb treatment in over 2 weeks. Strongly encouraged patient to get covid-19 vaccination, she is considering. No changes today. FU in 3-4 months with Dr. [name].   Anxiety - Maintained on Lexapro 20mg  daily - She was  given RX xanax 0.25mg  TID for anxiety upon discharge from hospital. PMP reviewed, overdose risk score is 000. Ok to refill 1 week supply and patient to follow-up with PCP for further refills.    Chronic respiratory failure with hypoxia (HCC) - Continue 2L oxygen 24/7 to maintain O2 >88-90%    , NP 12/02/2019

## 2019-12-04 NOTE — Progress Notes (Deleted)
Cardiology Office Note:    Date:  12/04/2019   ID:  Kathleen Cameron, DOB 29-Nov-1965, MRN [id number]  PCP:  [name], MD  Cardiologist:  [name], MD  Electrophysiologist:  None   Referring MD: [name], *   No chief complaint on file.   History of Present Illness:    Kathleen Cameron is a 54 y.o. female with a hx of hypertension, severe obstructive pulmonary disease on home 2L O2 who presents for follow-up.  She was referred by Ames Dura, NP for evaluation of palpitations, with initial visit on 03/14/2019.  She was seen in the ED on 09/23/2018 with shortness of breath.  CTA was negative for PE but did show emphysema.  She was referred to pulmonology.  She reported that she has been experiencing palpitations and dyspnea with minimal exertion, so was referred to cardiology for evaluation.  Underwent an echocardiogram on 12/20/2018, which showed normal systolic function (EF greater than 65%, grade 1 diastolic dysfunction, mild LVH, normal RV function, no significant valvular disease, PASP 42, IVC small/colllapsible.  Cardiac monitor on 04/19/2019 showed no significant arrhythmias.  She was admitted to [facility] from 12/23 through 04/17/2019 after presenting with chest pain and shortness of breath.  She was treated for COPD exacerbation.  Troponin level increased to 372.  Cardiac catheterization was attempted but she was unable to lie flat.  She completed 3 days on heparin drip.  Outpatient coronary CTA was ordered.  Coronary CTA on 06/08/2019 showed calcium score 3 (77th percentile), nonobstructive CAD with calcified plaque in D1 causing 0-24% stenosis.    Since last clinic visit, she was hospitalized last month with COPD exacerbation, treated with course of antibiotics and steroids.  was hospitalized last month with COPD exacerbation.  Improved with steroids.  Currently reports having baseline dyspnea.  Denies any chest pain.  Denies any lightheadedness or  syncope.  Does report she has had lower extremity edema, describes as swelling in her feet and ankles over the last several weeks.   Past Medical History:  Diagnosis Date  . Asthma   . Hypertension     No past surgical history on file.  Current Medications: No outpatient medications have been marked as taking for the 12/09/19 encounter (Appointment) with [name], MD.     Allergies:   Stiolto respimat [tiotropium bromide-olodaterol]   Social History   Socioeconomic History  . Marital status: Married    Spouse name: Not on file  . Number of children: Not on file  . Years of education: Not on file  . Highest education level: Not on file  Occupational History  . Not on file  Tobacco Use  . Smoking status: Never Smoker  . Smokeless tobacco: Never Used  Substance and Sexual Activity  . Alcohol use: Yes    Alcohol/week: 2.0 standard drinks    Types: 2 Cans of beer per week  . Drug use: Never  . Sexual activity: Not on file  Other Topics Concern  . Not on file  Social History Narrative  . Not on file   Social Determinants of Health   Financial Resource Strain:   . Difficulty of Paying Living Expenses:   Food Insecurity:   . Worried About Programme researcher, broadcasting/film/video in the Last Year:   . Barista in the Last Year:   Transportation Needs:   . Freight forwarder (Medical):   [name] Lack of Transportation (Non-Medical):   Physical Activity:   .  Days of Exercise per Week:   . Minutes of Exercise per Session:   Stress:   . Feeling of Stress :   Social Connections:   . Frequency of Communication with Friends and Family:   . Frequency of Social Gatherings with Friends and Family:   . Attends Religious Services:   . Active Member of Clubs or Organizations:   . Attends Banker Meetings:   [name] Marital Status:      Family History: The patient's Family history is unknown by patient.  ROS:   Please see the history of present illness.    All  other systems reviewed and are negative.  EKGs/Labs/Other Studies Reviewed:    The following studies were reviewed today:   EKG:  EKG is not ordered today.  The ekg ordered most recetnly demonstrates normal sinus rhythm, rate 92, poor R wave progression, T wave flattening inlimb leads  TTE 12/20/18:  1. The left ventricle has hyperdynamic systolic function, with an ejection fraction of >65%. The cavity size was normal. There is mildly increased left ventricular wall thickness. Left ventricular diastolic Doppler parameters are consistent with  impaired relaxation. No evidence of left ventricular regional wall motion abnormalities.  2. The aortic root is normal in size and structure.  3. The aortic valve is tricuspid. No stenosis of the aortic valve.  4. Trivial pericardial effusion is present.  5. The right ventricle has normal systolic function. The cavity was normal. There is no increase in right ventricular wall thickness.  6. Normal IVC size with PA systolic pressure 42 mmHg.  7. No evidence of mitral valve stenosis. No significant mitral regurgitation.  Cardiac monitor 04/19/19:  No significant arrhythmias detected 3 days of data recorded on Zio monitor. Patient had a min HR of 60 bpm, max HR of 129 bpm, and avg HR of 79 bpm. Predominant underlying rhythm was Sinus Rhythm. No VT, SVT, atrial fibrillation, high degree block, or pauses noted. Isolated atrial and ventricular ectopy was rare (<1%). There were 0 triggered events. No significant arrhythmias detected.  TTE 04/14/19: 1. Left ventricular ejection fraction, by visual estimation, is 60 to  65%. The left ventricle has normal function. There is no left ventricular  hypertrophy.  2. Left ventricular diastolic parameters are consistent with Grade I  diastolic dysfunction (impaired relaxation).  3. The left ventricle has no regional wall motion abnormalities.  4. Global right ventricle has normal systolic function.The right    ventricular size is normal. No increase in right ventricular wall  thickness.  5. Left atrial size was normal.  6. Right atrial size was normal.  7. The mitral valve is normal in structure. No evidence of mitral valve  regurgitation. No evidence of mitral stenosis.  8. The tricuspid valve is normal in structure.  9. The aortic valve is normal in structure. Aortic valve regurgitation is  not visualized. No evidence of aortic valve sclerosis or stenosis.  10. The pulmonic valve was normal in structure. Pulmonic valve  regurgitation is not visualized.  11. TR signal is inadequate for assessing pulmonary artery systolic  pressure.  12. The inferior vena cava is normal in size with greater than 50%  respiratory variability, suggesting right atrial pressure of 3 mmHg.   Recent Labs: 04/13/2019: Magnesium 1.9 09/06/2019: BNP 32.2 11/05/2019: ALT 20 11/10/2019: BUN 31; Creatinine, Ser 0.96; Hemoglobin 11.9; Platelets 256; Potassium 4.4; Sodium 134  Recent Lipid Panel    Component Value Date/Time   CHOL 193 04/13/2019 0640  CHOL 300 (H) 03/10/2018 1033   TRIG 85 04/13/2019 0640   HDL 68 04/13/2019 0640   HDL 72 03/10/2018 1033   CHOLHDL 2.8 04/13/2019 0640   VLDL 17 04/13/2019 0640   LDLCALC 108 (H) 04/13/2019 0640   LDLCALC 206 (H) 03/10/2018 1033    Physical Exam:    VS:  There were no vitals taken for this visit.    Wt Readings from Last 3 Encounters:  12/02/19 106 lb 9.6 oz (48.4 kg)  11/22/19 104 lb 3.2 oz (47.3 kg)  11/05/19 114 lb (51.7 kg)     GEN:  in no acute distress, on O2 HEENT: Normal NECK: No JVD LYMPHATICS: No lymphadenopathy CARDIAC: RRR, no murmurs, rubs, gallops RESPIRATORY: Diminished breath sounds ABDOMEN: Soft, non-tender, non-distended MUSCULOSKELETAL: Mild non-pitting edema in ankles/top of feet SKIN: Warm and dry NEUROLOGIC:  Alert and oriented x 3 PSYCHIATRIC:  Normal affect   ASSESSMENT:    No diagnosis found. PLAN:    Chest pain:  admission in December 2020 with chest pain and mild troponin elevation (high-sensitivity troponin 372), but occurred in setting of COPD exacerbation.  She was unable to lie flat for cardiac catheterization.  Coronary CTA on 06/08/2019 showed calcium score 3 (77th percentile), nonobstructive CAD with calcified plaque in D1 causing 0-24% stenosis. -Continued crestor 20 mg daily  Palpitations: No significant arrhythmia on Zio patch in December 2020  Hypertension: On bisoprolol 10 mg daily, amlodipine 5 mg daily, and hydrochlorothiazide 25 mg daily  Lower extremity edema: mild non-pitting edema in ankles/top of feet.  Will check BNP, CMP.  Suspect secondary to amlodipine use.  Not causing patient any symptoms.  Recommend leg elevation and use of compression stockings while awake  Hyperlipidemia: On rosuvastatin 20 mg daily.  LDL 108 on 04/13/2019, significantly improved from 206 in 2019  RTC in ***   Medication Adjustments/Labs and Tests Ordered: Current medicines are reviewed at length with the patient today.  Concerns regarding medicines are outlined above.  No orders of the defined types were placed in this encounter.  No orders of the defined types were placed in this encounter.   There are no Patient Instructions on file for this visit.   Signed, [name], MD  12/04/2019 1:27 PM    [facility]

## 2019-12-06 ENCOUNTER — Telehealth: Payer: Self-pay | Admitting: *Deleted

## 2019-12-06 NOTE — Telephone Encounter (Signed)
Faxed signed orders to [facility]. Confirmation page 9:05 am.

## 2019-12-09 ENCOUNTER — Ambulatory Visit: Payer: Commercial Managed Care - PPO | Admitting: Cardiology

## 2019-12-11 ENCOUNTER — Encounter: Payer: Self-pay | Admitting: Emergency Medicine

## 2019-12-13 ENCOUNTER — Encounter: Payer: Self-pay | Admitting: Emergency Medicine

## 2019-12-13 NOTE — Telephone Encounter (Signed)
Scheduled the patient an Appointment for 12/14/2019 10:40 am

## 2019-12-14 ENCOUNTER — Encounter: Payer: Self-pay | Admitting: Emergency Medicine

## 2019-12-14 ENCOUNTER — Ambulatory Visit (INDEPENDENT_AMBULATORY_CARE_PROVIDER_SITE_OTHER): Payer: Commercial Managed Care - PPO | Admitting: Emergency Medicine

## 2019-12-14 ENCOUNTER — Ambulatory Visit: Payer: Self-pay | Admitting: Emergency Medicine

## 2019-12-14 ENCOUNTER — Telehealth: Payer: Self-pay | Admitting: *Deleted

## 2019-12-14 ENCOUNTER — Other Ambulatory Visit: Payer: Self-pay

## 2019-12-14 VITALS — BP 122/78 | HR 61 | Temp 97.6°F | Ht 60.0 in | Wt 106.8 lb

## 2019-12-14 DIAGNOSIS — I10 Essential (primary) hypertension: Secondary | ICD-10-CM

## 2019-12-14 DIAGNOSIS — J439 Emphysema, unspecified: Secondary | ICD-10-CM

## 2019-12-14 DIAGNOSIS — R1013 Epigastric pain: Secondary | ICD-10-CM

## 2019-12-14 MED ORDER — NITROGLYCERIN 0.4 MG SL SUBL: 0.4000 mg | SUBLINGUAL_TABLET | SUBLINGUAL | 0 refills | Status: DC | PRN

## 2019-12-14 NOTE — Telephone Encounter (Signed)
Contacted Ascencion Dike in referral for her assistance to located a Home Health Care agency for patient. The patient needs assistance for a few hours daily because of chronic health problems. I was messaged back from [place] and she advised [facility]. I called the number [phone], left message in voice mail to call back. Called patient at 4:59 pm to inform her.

## 2019-12-14 NOTE — Patient Instructions (Addendum)
If you have lab work done today you will be contacted with your lab results within the next 2 weeks.  If you have not heard from [place] then please contact us. The fastest way to get your results is to register for My Chart.   IF you received an x-ray today, you will receive an invoice from [facility]. Please contact [facility] at [phone] with questions or concerns regarding your invoice.   IF you received labwork today, you will receive an invoice from [place]. Please contact [facility] at [phone] with questions or concerns regarding your invoice.   Our billing staff will not be able to assist you with questions regarding bills from these companies.  You will be contacted with the lab results as soon as they are available. The fastest way to get your results is to activate your My Chart account. Instructions are located on the last page of this paperwork. If you have not heard from [place] regarding the results in 2 weeks, please contact this office.      Abdominal Pain, Adult Many things can cause belly (abdominal) pain. Most times, belly pain is not dangerous. Many cases of belly pain can be watched and treated at home. Sometimes, though, belly pain is serious. Your doctor will try to find the cause of your belly pain. Follow these instructions at home:  Medicines  Take over-the-counter and prescription medicines only as told by your doctor.  Do not take medicines that help you poop (laxatives) unless told by your doctor. General instructions  Watch your belly pain for any changes.  Drink enough fluid to keep your pee (urine) pale yellow.  Keep all follow-up visits as told by your doctor. This is important. Contact a doctor if:  Your belly pain changes or gets worse.  You are not hungry, or you lose weight without trying.  You are having trouble pooping (constipated) or have watery poop (diarrhea) for more than 2-3 days.  You have pain when you pee  or poop.  Your belly pain wakes you up at night.  Your pain gets worse with meals, after eating, or with certain foods.  You are vomiting and cannot keep anything down.  You have a fever.  You have blood in your pee. Get help right away if:  Your pain does not go away as soon as your doctor says it should.  You cannot stop vomiting.  Your pain is only in areas of your belly, such as the right side or the left lower part of the belly.  You have bloody or black poop, or poop that looks like tar.  You have very bad pain, cramping, or bloating in your belly.  You have signs of not having enough fluid or water in your body (dehydration), such as: ? Dark pee, very little pee, or no pee. ? Cracked lips. ? Dry mouth. ? Sunken eyes. ? Sleepiness. ? Weakness.  You have trouble breathing or chest pain. Summary  Many cases of belly pain can be watched and treated at home.  Watch your belly pain for any changes.  Take over-the-counter and prescription medicines only as told by your doctor.  Contact a doctor if your belly pain changes or gets worse.  Get help right away if you have very bad pain, cramping, or bloating in your belly. This information is not intended to replace advice given to you by your health care provider. Make sure you discuss any questions you have with your  health care provider. Document Revised: 08/16/2018 Document Reviewed: 08/16/2018 Elsevier Patient Education  2020 Elsevier Inc.  Chronic Obstructive Pulmonary Disease Chronic obstructive pulmonary disease (COPD) is a long-term (chronic) lung problem. When you have COPD, it is hard for air to get in and out of your lungs. Usually the condition gets worse over time, and your lungs will never return to normal. There are things you can do to keep yourself as healthy as possible.  Your doctor may treat your condition with: ? Medicines. ? Oxygen. ? Lung surgery.  Your doctor may also  recommend: ? Rehabilitation. This includes steps to make your body work better. It may involve a team of specialists. ? Quitting smoking, if you smoke. ? Exercise and changes to your diet. ? Comfort measures (palliative care). Follow these instructions at home: Medicines  Take over-the-counter and prescription medicines only as told by your doctor.  Talk to your doctor before taking any cough or allergy medicines. You may need to avoid medicines that cause your lungs to be dry. Lifestyle  If you smoke, stop. Smoking makes the problem worse. If you need help quitting, ask your doctor.  Avoid being around things that make your breathing worse. This may include smoke, chemicals, and fumes.  Stay active, but remember to rest as well.  Learn and use tips on how to relax.  Make sure you get enough sleep. Most adults need at least 7 hours of sleep every night.  Eat healthy foods. Eat smaller meals more often. Rest before meals. Controlled breathing Learn and use tips on how to control your breathing as told by your doctor. Try:  Breathing in (inhaling) through your nose for 1 second. Then, pucker your lips and breath out (exhale) through your lips for 2 seconds.  Putting one hand on your belly (abdomen). Breathe in slowly through your nose for 1 second. Your hand on your belly should move out. Pucker your lips and breathe out slowly through your lips. Your hand on your belly should move in as you breathe out.  Controlled coughing Learn and use controlled coughing to clear mucus from your lungs. Follow these steps: 1. Lean your head a little forward. 2. Breathe in deeply. 3. Try to hold your breath for 3 seconds. 4. Keep your mouth slightly open while coughing 2 times. 5. Spit any mucus out into a tissue. 6. Rest and do the steps again 1 or 2 times as needed. General instructions  Make sure you get all the shots (vaccines) that your doctor recommends. Ask your doctor about a flu shot  and a pneumonia shot.  Use oxygen therapy and pulmonary rehabilitation if told by your doctor. If you need home oxygen therapy, ask your doctor if you should buy a tool to measure your oxygen level (oximeter).  Make a COPD action plan with your doctor. This helps you to know what to do if you feel worse than usual.  Manage any other conditions you have as told by your doctor.  Avoid going outside when it is very hot, cold, or humid.  Avoid people who have a sickness you can catch (contagious).  Keep all follow-up visits as told by your doctor. This is important. Contact a doctor if:  You cough up more mucus than usual.  There is a change in the color or thickness of the mucus.  It is harder to breathe than usual.  Your breathing is faster than usual.  You have trouble sleeping.  You need to use  your medicines more often than usual.  You have trouble doing your normal activities such as getting dressed or walking around the house. Get help right away if:  You have shortness of breath while resting.  You have shortness of breath that stops you from: ? Being able to talk. ? Doing normal activities.  Your chest hurts for longer than 5 minutes.  Your skin color is more blue than usual.  Your pulse oximeter shows that you have low oxygen for longer than 5 minutes.  You have a fever.  You feel too tired to breathe normally. Summary  Chronic obstructive pulmonary disease (COPD) is a long-term lung problem.  The way your lungs work will never return to normal. Usually the condition gets worse over time. There are things you can do to keep yourself as healthy as possible.  Take over-the-counter and prescription medicines only as told by your doctor.  If you smoke, stop. Smoking makes the problem worse. This information is not intended to replace advice given to you by your health care provider. Make sure you discuss any questions you have with your health care  provider. Document Revised: 03/20/2017 Document Reviewed: 05/12/2016 Elsevier Patient Education  2020 [employer].

## 2019-12-14 NOTE — Progress Notes (Signed)
Kathleen Cameron 54 y.o.   Chief Complaint  Patient presents with  . Fatigue    started at the end of last week.  . Abdominal Pain  . requesting order for Home Health    additional help so her caregive can go to work     HISTORY OF PRESENT ILLNESS: This is a 54 y.o. female complaining of intermittent episodes of upper abdominal pain for the past week.  Asymptomatic now. Also inquiring about home health care that was requested by [place] several weeks ago.  HPI   Prior to Admission medications   Medication Sig Start Date End Date Taking? Authorizing Provider  acetaminophen (TYLENOL) 500 MG tablet Take 500 mg by mouth daily as needed for mild pain or headache.   Yes [provider]  ALPRAZolam (XANAX) 0.25 MG tablet Take 1 tablet (0.25 mg total) by mouth 3 (three) times daily as needed for anxiety. 12/02/19  Yes [name], NP  alum & mag hydroxide-simeth (MAALOX/MYLANTA) 200-200-20 MG/5ML suspension Take 15 mLs by mouth every 6 (six) hours as needed for indigestion or heartburn. 11/12/19  Yes Shah, Pratik D, DO  amLODipine (NORVASC) 5 MG tablet Take 1 tablet (5 mg total) by mouth daily. 08/09/19 12/14/19 Yes SagardiaEilleen Kempf, MD  aspirin EC 81 MG EC tablet Take 1 tablet (81 mg total) by mouth daily. 04/17/19  Yes [name], MD  bisoprolol (ZEBETA) 10 MG tablet Take 1 tablet (10 mg total) by mouth daily. 10/20/19  Yes Omaya Nieland, Eilleen Kempf, MD  Budeson-Glycopyrrol-Formoterol (BREZTRI AEROSPHERE) 160-9-4.8 MCG/ACT AERO Inhale 2 puffs into the lungs in the morning and at bedtime. 09/27/19  Yes Leslye Peer, MD  Budeson-Glycopyrrol-Formoterol (BREZTRI AEROSPHERE) 160-9-4.8 MCG/ACT AERO Inhale 2 puffs into the lungs in the morning and at bedtime. 12/02/19  Yes [name], NP  escitalopram (LEXAPRO) 20 MG tablet Take 1 tablet (20 mg total) by mouth daily. 10/20/19 01/18/20 Yes Darice Vicario, Eilleen Kempf, MD  guaiFENesin (MUCINEX) 600 MG 12 hr tablet Take 600 mg by mouth  daily as needed for cough or to loosen phlegm.   Yes [provider]  hydrochlorothiazide (HYDRODIURIL) 25 MG tablet Take 25 mg by mouth daily. 09/07/19  Yes [provider]  ipratropium-albuterol (DUONEB) 0.5-2.5 (3) MG/3ML SOLN Take 3 mLs by nebulization every 6 (six) hours as needed. Patient taking differently: Take 3 mLs by nebulization every 6 (six) hours as needed (Shortness of Breath).  05/03/19  Yes [name], NP  nitroGLYCERIN (NITROSTAT) 0.4 MG SL tablet Place 1 tablet (0.4 mg total) under the tongue every 5 (five) minutes as needed for chest pain. 12/14/19  Yes Bricyn Labrada, Eilleen Kempf, MD  pantoprazole (PROTONIX) 40 MG tablet Take 1 tablet (40 mg total) by mouth daily. 10/27/19  Yes Demarkus Remmel, Eilleen Kempf, MD  rosuvastatin (CRESTOR) 20 MG tablet Take 1 tablet (20 mg total) by mouth daily. 10/20/19 01/18/20 Yes SagardiaEilleen Kempf, MD    Allergies  Allergen Reactions  . Stiolto Respimat [Tiotropium Bromide-Olodaterol] Other (See Comments)    Severe headaches and rapid heartrate    Patient Active Problem List   Diagnosis Date Noted  . Anxiety 12/02/2019  . COPD exacerbation (HCC) 11/06/2019  . Depression 11/06/2019  . Acute on chronic respiratory failure with hypoxia and hypercapnia (HCC) 11/06/2019  . Respiratory failure (HCC) 11/06/2019  . Current moderate episode of major depressive disorder without prior episode (HCC) 09/20/2019  . Pedal edema 08/04/2019  . Chronic respiratory failure with hypoxia (HCC) 03/07/2019  .  CKD (chronic kidney disease) 01/05/2019  . COPD (chronic obstructive pulmonary disease) (HCC) 09/29/2018  . Pulmonary nodules 09/27/2018  . Dyslipidemia 07/07/2018  . Essential hypertension 03/10/2018    Past Medical History:  Diagnosis Date  . Asthma   . Hypertension     No past surgical history on file.  Social History   Socioeconomic History  . Marital status: Married    Spouse name: Not on file  . Number of children: Not on  file  . Years of education: Not on file  . Highest education level: Not on file  Occupational History  . Not on file  Tobacco Use  . Smoking status: Never Smoker  . Smokeless tobacco: Never Used  Substance and Sexual Activity  . Alcohol use: Yes    Alcohol/week: 2.0 standard drinks    Types: 2 Cans of beer per week  . Drug use: Never  . Sexual activity: Not on file  Other Topics Concern  . Not on file  Social History Narrative  . Not on file   Social Determinants of Health   Financial Resource Strain:   . Difficulty of Paying Living Expenses: Not on file  Food Insecurity:   . Worried About Programme researcher, broadcasting/film/videounning Out of Food in the Last Year: Not on file  . Ran Out of Food in the Last Year: Not on file  Transportation Needs:   . Lack of Transportation (Medical): Not on file  . Lack of Transportation (Non-Medical): Not on file  Physical Activity:   . Days of Exercise per Week: Not on file  . Minutes of Exercise per Session: Not on file  Stress:   . Feeling of Stress : Not on file  Social Connections:   . Frequency of Communication with Friends and Family: Not on file  . Frequency of Social Gatherings with Friends and Family: Not on file  . Attends Religious Services: Not on file  . Active Member of Clubs or Organizations: Not on file  . Attends BankerClub or Organization Meetings: Not on file  . Marital Status: Not on file  Intimate Partner Violence:   . Fear of Current or Ex-Partner: Not on file  . Emotionally Abused: Not on file  . Physically Abused: Not on file  . Sexually Abused: Not on file    Family History  Family history unknown: Yes     Review of Systems  Constitutional: Negative.  Negative for chills and fever.  HENT: Negative.  Negative for congestion and sore throat.   Respiratory: Positive for shortness of breath. Negative for cough, hemoptysis, sputum production and wheezing.   Cardiovascular: Negative.  Negative for chest pain and palpitations.  Gastrointestinal:  Positive for abdominal pain and heartburn. Negative for blood in stool, diarrhea, melena, nausea and vomiting.  Genitourinary: Negative.  Negative for dysuria and hematuria.  Skin: Negative.  Negative for rash.  Neurological: Negative.  Negative for dizziness and headaches.  All other systems reviewed and are negative.  Today's Vitals   12/14/19 1028  BP: 122/78  Pulse: 61  Temp: 97.6 F (36.4 C)  TempSrc: Temporal  SpO2: 100%  Weight: 106 lb 12.8 oz (48.4 kg)  Height: 5' (1.524 m)   Body mass index is 20.86 kg/m.   Physical Exam Vitals reviewed.  Constitutional:      Appearance: She is well-developed.     Comments: In a wheelchair, on oxygen.  HENT:     Head: Normocephalic.  Eyes:     Extraocular Movements: Extraocular movements intact.  Pupils: Pupils are equal, round, and reactive to light.  Cardiovascular:     Rate and Rhythm: Normal rate and regular rhythm.     Pulses: Normal pulses.     Heart sounds: Normal heart sounds.  Pulmonary:     Breath sounds: No wheezing or rales.     Comments: Distant breath sounds Abdominal:     General: There is no distension.     Palpations: Abdomen is soft.     Tenderness: There is no abdominal tenderness.  Musculoskeletal:     Cervical back: Normal range of motion and neck supple.  Skin:    General: Skin is warm and dry.     Capillary Refill: Capillary refill takes less than 2 seconds.  Neurological:     General: No focal deficit present.     Mental Status: She is alert and oriented to person, place, and time.  Psychiatric:        Mood and Affect: Mood normal.        Behavior: Behavior normal.      ASSESSMENT & PLAN: Jezabel was seen today for fatigue, abdominal pain and requesting order for home health.  Diagnoses and all orders for this visit:  Dyspepsia  Pulmonary emphysema, unspecified emphysema type (HCC)  Essential hypertension  Other orders -     nitroGLYCERIN (NITROSTAT) 0.4 MG SL tablet; Place 1  tablet (0.4 mg total) under the tongue every 5 (five) minutes as needed for chest pain.    Patient Instructions       If you have lab work done today you will be contacted with your lab results within the next 2 weeks.  If you have not heard from [place] then please contact us. The fastest way to get your results is to register for My Chart.   IF you received an x-ray today, you will receive an invoice from [facility]. Please contact [facility] at [phone] with questions or concerns regarding your invoice.   IF you received labwork today, you will receive an invoice from [facility]. Please contact [facility] at [phone] with questions or concerns regarding your invoice.   Our billing staff will not be able to assist you with questions regarding bills from these companies.  You will be contacted with the lab results as soon as they are available. The fastest way to get your results is to activate your My Chart account. Instructions are located on the last page of this paperwork. If you have not heard from [place] regarding the results in 2 weeks, please contact this office.      Abdominal Pain, Adult Many things can cause belly (abdominal) pain. Most times, belly pain is not dangerous. Many cases of belly pain can be watched and treated at home. Sometimes, though, belly pain is serious. Your doctor will try to find the cause of your belly pain. Follow these instructions at home:  Medicines  Take over-the-counter and prescription medicines only as told by your doctor.  Do not take medicines that help you poop (laxatives) unless told by your doctor. General instructions  Watch your belly pain for any changes.  Drink enough fluid to keep your pee (urine) pale yellow.  Keep all follow-up visits as told by your doctor. This is important. Contact a doctor if:  Your belly pain changes or gets worse.  You are not hungry, or you lose weight without trying.  You are  having trouble pooping (constipated) or have watery poop (diarrhea) for more than 2-3 days.  You have pain when you pee or poop.  Your belly pain wakes you up at night.  Your pain gets worse with meals, after eating, or with certain foods.  You are vomiting and cannot keep anything down.  You have a fever.  You have blood in your pee. Get help right away if:  Your pain does not go away as soon as your doctor says it should.  You cannot stop vomiting.  Your pain is only in areas of your belly, such as the right side or the left lower part of the belly.  You have bloody or black poop, or poop that looks like tar.  You have very bad pain, cramping, or bloating in your belly.  You have signs of not having enough fluid or water in your body (dehydration), such as: ? Dark pee, very little pee, or no pee. ? Cracked lips. ? Dry mouth. ? Sunken eyes. ? Sleepiness. ? Weakness.  You have trouble breathing or chest pain. Summary  Many cases of belly pain can be watched and treated at home.  Watch your belly pain for any changes.  Take over-the-counter and prescription medicines only as told by your doctor.  Contact a doctor if your belly pain changes or gets worse.  Get help right away if you have very bad pain, cramping, or bloating in your belly. This information is not intended to replace advice given to you by your health care provider. Make sure you discuss any questions you have with your health care provider. Document Revised: 08/16/2018 Document Reviewed: 08/16/2018 Elsevier Patient Education  2020 Else[employer] Inc.  Chronic Obstructive Pulmonary Disease Chronic obstructive pulmonary disease (COPD) is a long-term (chronic) lung problem. When you have COPD, it is hard for air to get in and out of your lungs. Usually the condition gets worse over time, and your lungs will never return to normal. There are things you can do to keep yourself as healthy as possible.  Your  doctor may treat your condition with: ? Medicines. ? Oxygen. ? Lung surgery.  Your doctor may also recommend: ? Rehabilitation. This includes steps to make your body work better. It may involve a team of specialists. ? Quitting smoking, if you smoke. ? Exercise and changes to your diet. ? Comfort measures (palliative care). Follow these instructions at home: Medicines  Take over-the-counter and prescription medicines only as told by your doctor.  Talk to your doctor before taking any cough or allergy medicines. You may need to avoid medicines that cause your lungs to be dry. Lifestyle  If you smoke, stop. Smoking makes the problem worse. If you need help quitting, ask your doctor.  Avoid being around things that make your breathing worse. This may include smoke, chemicals, and fumes.  Stay active, but remember to rest as well.  Learn and use tips on how to relax.  Make sure you get enough sleep. Most adults need at least 7 hours of sleep every night.  Eat healthy foods. Eat smaller meals more often. Rest before meals. Controlled breathing Learn and use tips on how to control your breathing as told by your doctor. Try:  Breathing in (inhaling) through your nose for 1 second. Then, pucker your lips and breath out (exhale) through your lips for 2 seconds.  Putting one hand on your belly (abdomen). Breathe in slowly through your nose for 1 second. Your hand on your belly should move out. Pucker your lips and breathe out slowly through your lips. Your  hand on your belly should move in as you breathe out.  Controlled coughing Learn and use controlled coughing to clear mucus from your lungs. Follow these steps: 1. Lean your head a little forward. 2. Breathe in deeply. 3. Try to hold your breath for 3 seconds. 4. Keep your mouth slightly open while coughing 2 times. 5. Spit any mucus out into a tissue. 6. Rest and do the steps again 1 or 2 times as needed. General  instructions  Make sure you get all the shots (vaccines) that your doctor recommends. Ask your doctor about a flu shot and a pneumonia shot.  Use oxygen therapy and pulmonary rehabilitation if told by your doctor. If you need home oxygen therapy, ask your doctor if you should buy a tool to measure your oxygen level (oximeter).  Make a COPD action plan with your doctor. This helps you to know what to do if you feel worse than usual.  Manage any other conditions you have as told by your doctor.  Avoid going outside when it is very hot, cold, or humid.  Avoid people who have a sickness you can catch (contagious).  Keep all follow-up visits as told by your doctor. This is important. Contact a doctor if:  You cough up more mucus than usual.  There is a change in the color or thickness of the mucus.  It is harder to breathe than usual.  Your breathing is faster than usual.  You have trouble sleeping.  You need to use your medicines more often than usual.  You have trouble doing your normal activities such as getting dressed or walking around the house. Get help right away if:  You have shortness of breath while resting.  You have shortness of breath that stops you from: ? Being able to talk. ? Doing normal activities.  Your chest hurts for longer than 5 minutes.  Your skin color is more blue than usual.  Your pulse oximeter shows that you have low oxygen for longer than 5 minutes.  You have a fever.  You feel too tired to breathe normally. Summary  Chronic obstructive pulmonary disease (COPD) is a long-term lung problem.  The way your lungs work will never return to normal. Usually the condition gets worse over time. There are things you can do to keep yourself as healthy as possible.  Take over-the-counter and prescription medicines only as told by your doctor.  If you smoke, stop. Smoking makes the problem worse. This information is not intended to replace advice  given to you by your health care provider. Make sure you discuss any questions you have with your health care provider. Document Revised: 03/20/2017 Document Reviewed: 05/12/2016 Elsevier Patient Education  2020 [employer] Inc[employer]      Edwina Barth, MD [facility]

## 2019-12-15 ENCOUNTER — Telehealth: Payer: Self-pay | Admitting: *Deleted

## 2019-12-15 NOTE — Telephone Encounter (Signed)
Spoke to the owner Drinda Butts of [employer] [facility] [employer] to find out if she can provide in home care for the patient base on her insurance with [facility]. Per Drinda Butts she was in the process of faxing [facility] her credentials about her business to [facility]. Drinda Butts will contact the patient to speak with her in detail about the services her company provides for patient care in the home. Also, I asked Drinda Butts to contact me to let me know the outcome.

## 2019-12-16 ENCOUNTER — Encounter: Payer: Self-pay | Admitting: Emergency Medicine

## 2019-12-19 ENCOUNTER — Encounter: Payer: Self-pay | Admitting: Emergency Medicine

## 2019-12-20 NOTE — Telephone Encounter (Signed)
Followed and responded on other message

## 2019-12-22 NOTE — Telephone Encounter (Signed)
Please look into this. Thanks.

## 2019-12-22 NOTE — Telephone Encounter (Signed)
Pt needs a letter for home care and recommendations if any for them I can fax once it is completed

## 2019-12-27 ENCOUNTER — Encounter: Payer: Self-pay | Admitting: Emergency Medicine

## 2019-12-27 ENCOUNTER — Telehealth: Payer: Self-pay | Admitting: *Deleted

## 2019-12-27 NOTE — Telephone Encounter (Signed)
Spoke to patient concerning the message sent from patient on 12/19/2019 about home care. She did not leave the name of home health, but only the person she spoke with [place] and the fax number. I advised her the doctor needs forms/request from office to complete the request. The patient states she will find out the name of company.

## 2019-12-28 ENCOUNTER — Ambulatory Visit: Payer: Commercial Managed Care - PPO | Admitting: Registered Nurse

## 2019-12-30 NOTE — Telephone Encounter (Signed)
Pt requesting an air concentrator for her when she goes out

## 2019-12-31 ENCOUNTER — Emergency Department (HOSPITAL_COMMUNITY)
Admission: EM | Admit: 2019-12-31 | Discharge: 2019-12-31 | Disposition: A | Discharge status: Home / Self Care | Payer: Commercial Managed Care - PPO | Attending: Emergency Medicine | Admitting: Emergency Medicine

## 2019-12-31 ENCOUNTER — Encounter: Payer: Self-pay | Admitting: Emergency Medicine

## 2019-12-31 ENCOUNTER — Encounter (HOSPITAL_COMMUNITY): Payer: Self-pay

## 2019-12-31 DIAGNOSIS — Z7951 Long term (current) use of inhaled steroids: Secondary | ICD-10-CM | POA: Insufficient documentation

## 2019-12-31 DIAGNOSIS — J45909 Unspecified asthma, uncomplicated: Secondary | ICD-10-CM | POA: Insufficient documentation

## 2019-12-31 DIAGNOSIS — N189 Chronic kidney disease, unspecified: Secondary | ICD-10-CM | POA: Insufficient documentation

## 2019-12-31 DIAGNOSIS — Z79899 Other long term (current) drug therapy: Secondary | ICD-10-CM | POA: Insufficient documentation

## 2019-12-31 DIAGNOSIS — Z7982 Long term (current) use of aspirin: Secondary | ICD-10-CM | POA: Insufficient documentation

## 2019-12-31 DIAGNOSIS — I129 Hypertensive chronic kidney disease with stage 1 through stage 4 chronic kidney disease, or unspecified chronic kidney disease: Secondary | ICD-10-CM | POA: Insufficient documentation

## 2019-12-31 DIAGNOSIS — B0231 Zoster conjunctivitis: Secondary | ICD-10-CM

## 2019-12-31 HISTORY — DX: Chronic obstructive pulmonary disease, unspecified: J44.9

## 2019-12-31 MED ORDER — ACETAMINOPHEN 500 MG PO TABS
1000.0000 mg | ORAL_TABLET | Freq: Once | ORAL | Status: AC
  Administered 2019-12-31: 1000 mg via ORAL
  Filled 2019-12-31: qty 2

## 2019-12-31 NOTE — ED Triage Notes (Signed)
Pt comes via [facility] for possible shingles on the L side of her face, started in her L eye, swelling increased over the past two days, weeping lesions on the face, eye doctor gave her azithromycin yesterday. Pt wears 2-3 L all the time, pt also c/o of SOB.

## 2019-12-31 NOTE — ED Provider Notes (Signed)
[facility]  CSN: [id number] Arrival date & time: 12/31/19 0036  Chief Complaint(s) Herpes Zoster  HPI Kathleen Cameron is a 54 y.o. female diagnosed with shingles to left face involving the eye who saw ophthalmology on September 9 presents with swelling to the left eyelid.  She reports that the symptoms began the day prior with a small lesion to the left eye.  She was seen by urgent care who prescribed doxycycline and had her follow-up with ophthalmologist the following day.  During that time she had severe swelling to the left eye which improved and returned tonight.  Swelling is not as bad as it initially was.  She is endorsing associated pain due to the rash itself.  She denies any ocular pain.  Denies any other acute physical complaints.  She did report shortness of breath to triage but this is chronic and stable.  Patient is already on doxycycline prescribed by urgent care, erythromycin and valacyclovir prescribed by ophthalmologist.  HPI  Past Medical History Past Medical History:  Diagnosis Date   Asthma    COPD (chronic obstructive pulmonary disease) (HCC)    Hypertension    Patient Active Problem List   Diagnosis Date Noted   Anxiety 12/02/2019   COPD exacerbation (HCC) 11/06/2019   Depression 11/06/2019   Acute on chronic respiratory failure with hypoxia and hypercapnia (HCC) 11/06/2019   Respiratory failure (HCC) 11/06/2019   Current moderate episode of major depressive disorder without prior episode (HCC) 09/20/2019   Pedal edema 08/04/2019   Chronic respiratory failure with hypoxia (HCC) 03/07/2019   CKD (chronic kidney disease) 01/05/2019   COPD (chronic obstructive pulmonary disease) (HCC) 09/29/2018   Pulmonary nodules 09/27/2018   Dyslipidemia 07/07/2018   Essential hypertension 03/10/2018   Home Medication(s) Prior to Admission medications   Medication Sig Start Date End Date Taking?  Authorizing Provider  acetaminophen (TYLENOL) 500 MG tablet Take 500 mg by mouth daily as needed for mild pain or headache.    [provider]  ALPRAZolam Prudy Feeler(XANAX) 0.25 MG tablet Take 1 tablet (0.25 mg total) by mouth 3 (three) times daily as needed for anxiety. 12/02/19   Glenford BayleyWalsh, Elizabeth W, NP  alum & mag hydroxide-simeth (MAALOX/MYLANTA) 200-200-20 MG/5ML suspension Take 15 mLs by mouth every 6 (six) hours as needed for indigestion or heartburn. 11/12/19   Sherryll BurgerShah, Pratik D, DO  amLODipine (NORVASC) 5 MG tablet Take 1 tablet (5 mg total) by mouth daily. 08/09/19 12/14/19  Georgina QuintSagardia, Miguel Jose, MD  aspirin EC 81 MG EC tablet Take 1 tablet (81 mg total) by mouth daily. 04/17/19   Calvert Cantorizwan, Saima, MD  bisoprolol (ZEBETA) 10 MG tablet Take 1 tablet (10 mg total) by mouth daily. 10/20/19   Georgina QuintSagardia, Miguel Jose, MD  Budeson-Glycopyrrol-Formoterol (BREZTRI AEROSPHERE) 160-9-4.8 MCG/ACT AERO Inhale 2 puffs into the lungs in the morning and at bedtime. 09/27/19   Leslye PeerByrum, Robert S, MD  Budeson-Glycopyrrol-Formoterol (BREZTRI AEROSPHERE) 160-9-4.8 MCG/ACT AERO Inhale 2 puffs into the lungs in the morning and at bedtime. 12/02/19   Glenford BayleyWalsh, Elizabeth W, NP  escitalopram (LEXAPRO) 20 MG tablet Take 1 tablet (20 mg total) by mouth daily. 10/20/19 01/18/20  Georgina QuintSagardia, Miguel Jose, MD  guaiFENesin (MUCINEX) 600 MG 12 hr tablet Take 600 mg by mouth daily as needed for cough or to loosen phlegm.    [provider]  hydrochlorothiazide (HYDRODIURIL) 25 MG tablet Take 25 mg by mouth daily. 09/07/19   [provider]  ipratropium-albuterol (DUONEB) 0.5-2.5 (3) MG/3ML  SOLN Take 3 mLs by nebulization every 6 (six) hours as needed. Patient taking differently: Take 3 mLs by nebulization every 6 (six) hours as needed (Shortness of Breath).  05/03/19   [name], NP  nitroGLYCERIN (NITROSTAT) 0.4 MG SL tablet Place 1 tablet (0.4 mg total) under the tongue every 5 (five) minutes as needed for chest pain. 12/14/19    [name], MD  pantoprazole (PROTONIX) 40 MG tablet Take 1 tablet (40 mg total) by mouth daily. 10/27/19   [name], MD  rosuvastatin (CRESTOR) 20 MG tablet Take 1 tablet (20 mg total) by mouth daily. 10/20/19 01/18/20  [name], MD                                                                                                                                    Past Surgical History History reviewed. No pertinent surgical history. Family History Family History  Family history unknown: Yes    Social History Social History   Tobacco Use   Smoking status: Never Smoker   Smokeless tobacco: Never Used  Substance Use Topics   Alcohol use: Yes    Alcohol/week: 2.0 standard drinks    Types: 2 Cans of beer per week   Drug use: Never   Allergies Stiolto respimat [tiotropium bromide-olodaterol]  Review of Systems Review of Systems All other systems are reviewed and are negative for acute change except as noted in the HPI  Physical Exam Vital Signs  I have reviewed the triage vital signs BP (!) 144/96 (BP Location: Right Arm)    Pulse 63    Temp 97.6 F (36.4 C) (Oral)    Resp 15    SpO2 99%   Physical Exam Vitals reviewed.  Constitutional:      General: She is not in acute distress.    Appearance: She is well-developed. She is not diaphoretic.  HENT:     Head: Normocephalic and atraumatic.     Comments: Vesicular rash to left V1/V2 distribution with periorbital swelling. No proptosis.    Right Ear: External ear normal.     Left Ear: External ear normal.     Nose: Nose normal.  Eyes:     General: No scleral icterus.    Conjunctiva/sclera:     Left eye: Left conjunctiva is injected.  Neck:     Trachea: Phonation normal.  Cardiovascular:     Rate and Rhythm: Normal rate and regular rhythm.  Pulmonary:     Effort: Pulmonary effort is normal. No respiratory distress.     Breath sounds: No stridor.  Abdominal:     General: There is no  distension.  Musculoskeletal:        General: Normal range of motion.     Cervical back: Normal range of motion.  Neurological:     Mental Status: She is alert and oriented to person, place, and time.  Psychiatric:  Behavior: Behavior normal.     ED Results and Treatments Labs (all labs ordered are listed, but only abnormal results are displayed) Labs Reviewed - No data to display                                                                                                                       EKG  EKG Interpretation  Date/Time:  Saturday December 31 2019 00:59:40 EDT Ventricular Rate:  59 PR Interval:    QRS Duration: 84 QT Interval:  453 QTC Calculation: 449 R Axis:   103 Text Interpretation: Sinus rhythm Right axis deviation Consider left ventricular hypertrophy Confirmed by Drema Pry [phone]) on 12/31/2019 1:26:05 AM      Radiology No results found.  Pertinent labs & imaging results that were available during my care of the patient were reviewed by me and considered in my medical decision making (see chart for details).  Medications Ordered in ED Medications  acetaminophen (TYLENOL) tablet 1,000 mg (has no administration in time range)                                                                                                                                    Procedures Procedures  (including critical care time)  Medical Decision Making / ED Course I have reviewed the nursing notes for this encounter and the patient's prior records (if available in EHR or on provided paperwork).   Kalina Morabito was evaluated in Emergency Department on 12/31/2019 for the symptoms described in the history of present illness. She was evaluated in the context of the global COVID-19 pandemic, which necessitated consideration that the patient might be at risk for infection with the SARS-CoV-2 virus that causes COVID-19. Institutional protocols and algorithms that  pertain to the evaluation of patients at risk for COVID-19 are in a state of rapid change based on information released by regulatory bodies including the CDC and federal and state organizations. These policies and algorithms were followed during the patient's care in the ED.  Consistent with zoster ophthalmicus. Not suspicious for superimposed infection concerning for preseptal or orbital cellulitis at this time. She is currently on Doxy and erythromycin to prevent secondary infection. Recommend close follow up with ophtho.      Final Clinical Impression(s) / ED Diagnoses Final diagnoses:  Herpes zoster conjunctivitis    The patient appears reasonably screened and/or stabilized for discharge and I doubt any  other medical condition or other [facility] requiring further screening, evaluation, or treatment in the ED at this time prior to discharge. Safe for discharge with strict return precautions.  Disposition: Discharge  Condition: Good  I have discussed the results, Dx and Tx plan with the patient/family who expressed understanding and agree(s) with the plan. Discharge instructions discussed at length. The patient/family was given strict return precautions who verbalized understanding of the instructions. No further questions at time of discharge.    ED Discharge Orders    None       Follow Up: [facility] [address] [place] [address] [phone]  Schedule an appointment as soon as possible for a visit  As needed     This chart was dictated using voice recognition software.  Despite best efforts to proofread,  errors can occur which can change the documentation meaning.   Nira Conn, MD 12/31/19 [phone]

## 2019-12-31 NOTE — ED Notes (Signed)
Discharge instructions discussed with pt. Pt verbalized understanding. Pt stable and ambulatory. No signature pad available. 

## 2019-12-31 NOTE — Telephone Encounter (Signed)
I agree with this request.  Thanks.

## 2020-01-02 ENCOUNTER — Ambulatory Visit (INDEPENDENT_AMBULATORY_CARE_PROVIDER_SITE_OTHER): Payer: PRIVATE HEALTH INSURANCE | Admitting: Emergency Medicine

## 2020-01-02 ENCOUNTER — Other Ambulatory Visit: Payer: Self-pay

## 2020-01-02 ENCOUNTER — Encounter: Payer: Self-pay | Admitting: Emergency Medicine

## 2020-01-02 VITALS — BP 110/73 | HR 71 | Temp 97.6°F | Resp 15 | Ht 60.0 in | Wt 106.8 lb

## 2020-01-02 DIAGNOSIS — F325 Major depressive disorder, single episode, in full remission: Secondary | ICD-10-CM

## 2020-01-02 DIAGNOSIS — J439 Emphysema, unspecified: Secondary | ICD-10-CM | POA: Diagnosis not present

## 2020-01-02 DIAGNOSIS — B0230 Zoster ocular disease, unspecified: Secondary | ICD-10-CM | POA: Diagnosis not present

## 2020-01-02 MED ORDER — ESCITALOPRAM OXALATE 20 MG PO TABS: 20.0000 mg | ORAL_TABLET | Freq: Every day | ORAL | 1 refills | Status: DC

## 2020-01-02 MED ORDER — ALPRAZOLAM 0.25 MG PO TABS: 0.2500 mg | ORAL_TABLET | Freq: Three times a day (TID) | ORAL | 1 refills | Status: DC | PRN

## 2020-01-02 NOTE — Progress Notes (Signed)
Kathleen Cameron 54 y.o.   Chief Complaint  Patient presents with  . Herpes Zoster    pt was seen in urgent care last week who sent pt to [facility] for shingles of the eye, pt went to ED this weekend for swelling and pain, pt notes swelling is better today but still very noticable, pt notes she can still see out of it and there have been no vision changes     HISTORY OF PRESENT ILLNESS: This is a 54 y.o. female here for follow-up of shingles to her left eye.  It started about 1 week ago.  Went to urgent care center.  Saw ophthalmologist last Thursday.  Presently on medication.  Denies visual disturbances.  No new symptoms.  Doing better.  Feels about 50% better.  HPI   Prior to Admission medications   Medication Sig Start Date End Date Taking? Authorizing Provider  acetaminophen (TYLENOL) 500 MG tablet Take 500 mg by mouth daily as needed for mild pain or headache.   Yes [provider]  ALPRAZolam (XANAX) 0.25 MG tablet Take 1 tablet (0.25 mg total) by mouth 3 (three) times daily as needed for anxiety. 01/02/20  Yes Chaniqua Brisby, Eilleen Kempf, MD  alum & mag hydroxide-simeth (MAALOX/MYLANTA) 200-200-20 MG/5ML suspension Take 15 mLs by mouth every 6 (six) hours as needed for indigestion or heartburn. 11/12/19  Yes Sherryll Burger, Pratik D, DO  aspirin EC 81 MG EC tablet Take 1 tablet (81 mg total) by mouth daily. 04/17/19  Yes [name], MD  bisoprolol (ZEBETA) 10 MG tablet Take 1 tablet (10 mg total) by mouth daily. 10/20/19  Yes Diksha Tagliaferro, Eilleen Kempf, MD  Budeson-Glycopyrrol-Formoterol (BREZTRI AEROSPHERE) 160-9-4.8 MCG/ACT AERO Inhale 2 puffs into the lungs in the morning and at bedtime. 09/27/19  Yes Leslye Peer, MD  Budeson-Glycopyrrol-Formoterol (BREZTRI AEROSPHERE) 160-9-4.8 MCG/ACT AERO Inhale 2 puffs into the lungs in the morning and at bedtime. 12/02/19  Yes [name], NP  escitalopram (LEXAPRO) 20 MG tablet Take 1 tablet (20 mg total) by mouth daily. 01/02/20 04/01/20 Yes  Jasmin Trumbull, Eilleen Kempf, MD  guaiFENesin (MUCINEX) 600 MG 12 hr tablet Take 600 mg by mouth daily as needed for cough or to loosen phlegm.   Yes [provider]  hydrochlorothiazide (HYDRODIURIL) 25 MG tablet Take 25 mg by mouth daily. 09/07/19  Yes [provider]  ipratropium-albuterol (DUONEB) 0.5-2.5 (3) MG/3ML SOLN Take 3 mLs by nebulization every 6 (six) hours as needed. Patient taking differently: Take 3 mLs by nebulization every 6 (six) hours as needed (Shortness of Breath).  05/03/19  Yes [name], NP  nitroGLYCERIN (NITROSTAT) 0.4 MG SL tablet Place 1 tablet (0.4 mg total) under the tongue every 5 (five) minutes as needed for chest pain. 12/14/19  Yes Angel Hobdy, Eilleen Kempf, MD  pantoprazole (PROTONIX) 40 MG tablet Take 1 tablet (40 mg total) by mouth daily. 10/27/19  Yes Delano Scardino, Eilleen Kempf, MD  rosuvastatin (CRESTOR) 20 MG tablet Take 1 tablet (20 mg total) by mouth daily. 10/20/19 01/18/20 Yes Makya Phillis, Eilleen Kempf, MD  amLODipine (NORVASC) 5 MG tablet Take 1 tablet (5 mg total) by mouth daily. 08/09/19 12/14/19  [name], MD    Allergies  Allergen Reactions  . Stiolto Respimat [Tiotropium Bromide-Olodaterol] Other (See Comments)    Severe headaches and rapid heartrate    Patient Active Problem List   Diagnosis Date Noted  . Anxiety 12/02/2019  . COPD exacerbation (HCC) 11/06/2019  . Depression 11/06/2019  . Acute on chronic respiratory  failure with hypoxia and hypercapnia (HCC) 11/06/2019  . Respiratory failure (HCC) 11/06/2019  . Current moderate episode of major depressive disorder without prior episode (HCC) 09/20/2019  . Pedal edema 08/04/2019  . Chronic respiratory failure with hypoxia (HCC) 03/07/2019  . CKD (chronic kidney disease) 01/05/2019  . COPD (chronic obstructive pulmonary disease) (HCC) 09/29/2018  . Pulmonary nodules 09/27/2018  . Dyslipidemia 07/07/2018  . Essential hypertension 03/10/2018    Past Medical History:    Diagnosis Date  . Asthma   . COPD (chronic obstructive pulmonary disease) (HCC)   . Hypertension     No past surgical history on file.  Social History   Socioeconomic History  . Marital status: Married    Spouse name: Not on file  . Number of children: Not on file  . Years of education: Not on file  . Highest education level: Not on file  Occupational History  . Not on file  Tobacco Use  . Smoking status: Never Smoker  . Smokeless tobacco: Never Used  Substance and Sexual Activity  . Alcohol use: Yes    Alcohol/week: 2.0 standard drinks    Types: 2 Cans of beer per week  . Drug use: Never  . Sexual activity: Not on file  Other Topics Concern  . Not on file  Social History Narrative  . Not on file   Social Determinants of Health   Financial Resource Strain:   . Difficulty of Paying Living Expenses: Not on file  Food Insecurity:   . Worried About Programme researcher, broadcasting/film/videounning Out of Food in the Last Year: Not on file  . Ran Out of Food in the Last Year: Not on file  Transportation Needs:   . Lack of Transportation (Medical): Not on file  . Lack of Transportation (Non-Medical): Not on file  Physical Activity:   . Days of Exercise per Week: Not on file  . Minutes of Exercise per Session: Not on file  Stress:   . Feeling of Stress : Not on file  Social Connections:   . Frequency of Communication with Friends and Family: Not on file  . Frequency of Social Gatherings with Friends and Family: Not on file  . Attends Religious Services: Not on file  . Active Member of Clubs or Organizations: Not on file  . Attends BankerClub or Organization Meetings: Not on file  . Marital Status: Not on file  Intimate Partner Violence:   . Fear of Current or Ex-Partner: Not on file  . Emotionally Abused: Not on file  . Physically Abused: Not on file  . Sexually Abused: Not on file    Family History  Family history unknown: Yes     Review of Systems  Constitutional: Negative.  Negative for chills and  fever.  HENT: Negative.   Eyes: Positive for pain and redness. Negative for blurred vision, double vision and photophobia.  Cardiovascular: Negative for chest pain and palpitations.  Gastrointestinal: Negative for abdominal pain, diarrhea, nausea and vomiting.  Skin:       Left facial rash  Neurological: Negative.  Negative for dizziness and headaches.  All other systems reviewed and are negative.  Today's Vitals   01/02/20 1111  BP: 110/73  Pulse: 71  Resp: 15  Temp: 97.6 F (36.4 C)  TempSrc: Temporal  SpO2: 98%  Weight: 106 lb 12.8 oz (48.4 kg)  Height: 5' (1.524 m)   Body mass index is 20.86 kg/m.   Physical Exam Vitals reviewed.  Constitutional:      Appearance:  Normal appearance.  HENT:     Head: Normocephalic.  Eyes:     Extraocular Movements: Extraocular movements intact.     Pupils: Pupils are equal, round, and reactive to light.     Comments: Left eye: Periorbital edema, conjunctival inflammation.  Typical shingles rash to distribution of facial nerve  Cardiovascular:     Rate and Rhythm: Normal rate.  Pulmonary:     Effort: Pulmonary effort is normal.  Skin:    General: Skin is warm and dry.  Neurological:     General: No focal deficit present.     Mental Status: She is alert and oriented to person, place, and time.  Psychiatric:        Mood and Affect: Mood normal.      ASSESSMENT & PLAN: Neyah was seen today for herpes zoster.  Diagnoses and all orders for this visit:  Herpes zoster ophthalmicus of left eye  Major depressive disorder with single episode, in remission (HCC) -     escitalopram (LEXAPRO) 20 MG tablet; Take 1 tablet (20 mg total) by mouth daily.  Pulmonary emphysema, unspecified emphysema type (HCC)  Other orders -     ALPRAZolam (XANAX) 0.25 MG tablet; Take 1 tablet (0.25 mg total) by mouth 3 (three) times daily as needed for anxiety.    Patient Instructions       If you have lab work done today you will be  contacted with your lab results within the next 2 weeks.  If you have not heard from [place] then please contact us. The fastest way to get your results is to register for My Chart.   IF you received an x-ray today, you will receive an invoice from [facility]. Please contact [facility] at [phone] with questions or concerns regarding your invoice.   IF you received labwork today, you will receive an invoice from [facility]. Please contact [facility] at [phone] with questions or concerns regarding your invoice.   Our billing staff will not be able to assist you with questions regarding bills from these companies.  You will be contacted with the lab results as soon as they are available. The fastest way to get your results is to activate your My Chart account. Instructions are located on the last page of this paperwork. If you have not heard from [place] regarding the results in 2 weeks, please contact this office.     Shingles  Shingles is an infection. It gives you a painful skin rash and blisters that have fluid in them. Shingles is caused by the same germ (virus) that causes chickenpox. Shingles only happens in people who:  Have had chickenpox.  Have been given a shot of medicine (vaccine) to protect against chickenpox. Shingles is rare in this group. The first symptoms of shingles may be itching, tingling, or pain in an area on your skin. A rash will show on your skin a few days or weeks later. The rash is likely to be on one side of your body. The rash usually has a shape like a belt or a band. Over time, the rash turns into fluid-filled blisters. The blisters will break open, change into scabs, and dry up. Medicines may:  Help with pain and itching.  Help you get better sooner.  Help to prevent long-term problems. Follow these instructions at home: Medicines  Take over-the-counter and prescription medicines only as told by your doctor.  Put on an anti-itch cream  or numbing cream where you have a rash,  blisters, or scabs. Do this as told by your doctor. Helping with itching and discomfort   Put cold, wet cloths (cold compresses) on the area of the rash or blisters as told by your doctor.  Cool baths can help you feel better. Try adding baking soda or dry oatmeal to the water to lessen itching. Do not bathe in hot water. Blister and rash care  Keep your rash covered with a loose bandage (dressing).  Wear loose clothing that does not rub on your rash.  Keep your rash and blisters clean. To do this, wash the area with mild soap and cool water as told by your doctor.  Check your rash every day for signs of infection. Check for: ? More redness, swelling, or pain. ? Fluid or blood. ? Warmth. ? Pus or a bad smell.  Do not scratch your rash. Do not pick at your blisters. To help you to not scratch: ? Keep your fingernails clean and cut short. ? Wear gloves or mittens when you sleep, if scratching is a problem. General instructions  Rest as told by your doctor.  Keep all follow-up visits as told by your doctor. This is important.  Wash your hands often with soap and water. If soap and water are not available, use hand sanitizer. Doing this lowers your chance of getting a skin infection caused by germs (bacteria).  Your infection can cause chickenpox in people who have never had chickenpox or never got a shot of chickenpox vaccine. If you have blisters that did not change into scabs yet, try not to touch other people or be around other people, especially: ? Babies. ? Pregnant women. ? Children who have areas of red, itchy, or rough skin (eczema). ? Very old people who have transplants. ? People who have a long-term (chronic) sickness, like cancer or AIDS. Contact a doctor if:  Your pain does not get better with medicine.  Your pain does not get better after the rash heals.  You have any signs of infection in the rash area. These signs  include: ? More redness, swelling, or pain around the rash. ? Fluid or blood coming from the rash. ? The rash area feeling warm to the touch. ? Pus or a bad smell coming from the rash. Get help right away if:  The rash is on your face or nose.  You have pain in your face or pain by your eye.  You lose feeling on one side of your face.  You have trouble seeing.  You have ear pain, or you have ringing in your ear.  You have a loss of taste.  Your condition gets worse. Summary  Shingles gives you a painful skin rash and blisters that have fluid in them.  Shingles is an infection. It is caused by the same germ (virus) that causes chickenpox.  Keep your rash covered with a loose bandage (dressing). Wear loose clothing that does not rub on your rash.  If you have blisters that did not change into scabs yet, try not to touch other people or be around people. This information is not intended to replace advice given to you by your health care provider. Make sure you discuss any questions you have with your health care provider. Document Revised: 07/30/2018 Document Reviewed: 12/10/2016 Elsevier Patient Education  2020 Elsevier Inc.      Edwina Barth, MD [facility]

## 2020-01-02 NOTE — Patient Instructions (Addendum)
   If you have lab work done today you will be contacted with your lab results within the next 2 weeks.  If you have not heard from us then please contact us. The fastest way to get your results is to register for My Chart.   IF you received an x-ray today, you will receive an invoice from [facility]. Please contact [facility] at [phone] with questions or concerns regarding your invoice.   IF you received labwork today, you will receive an invoice from [facility]. Please contact [facility] at [phone] with questions or concerns regarding your invoice.   Our billing staff will not be able to assist you with questions regarding bills from these companies.  You will be contacted with the lab results as soon as they are available. The fastest way to get your results is to activate your My Chart account. Instructions are located on the last page of this paperwork. If you have not heard from us regarding the results in 2 weeks, please contact this office.     Shingles  Shingles is an infection. It gives you a painful skin rash and blisters that have fluid in them. Shingles is caused by the same germ (virus) that causes chickenpox. Shingles only happens in people who:  Have had chickenpox.  Have been given a shot of medicine (vaccine) to protect against chickenpox. Shingles is rare in this group. The first symptoms of shingles may be itching, tingling, or pain in an area on your skin. A rash will show on your skin a few days or weeks later. The rash is likely to be on one side of your body. The rash usually has a shape like a belt or a band. Over time, the rash turns into fluid-filled blisters. The blisters will break open, change into scabs, and dry up. Medicines may:  Help with pain and itching.  Help you get better sooner.  Help to prevent long-term problems. Follow these instructions at home: Medicines  Take over-the-counter and prescription medicines only as  told by your doctor.  Put on an anti-itch cream or numbing cream where you have a rash, blisters, or scabs. Do this as told by your doctor. Helping with itching and discomfort   Put cold, wet cloths (cold compresses) on the area of the rash or blisters as told by your doctor.  Cool baths can help you feel better. Try adding baking soda or dry oatmeal to the water to lessen itching. Do not bathe in hot water. Blister and rash care  Keep your rash covered with a loose bandage (dressing).  Wear loose clothing that does not rub on your rash.  Keep your rash and blisters clean. To do this, wash the area with mild soap and cool water as told by your doctor.  Check your rash every day for signs of infection. Check for: ? More redness, swelling, or pain. ? Fluid or blood. ? Warmth. ? Pus or a bad smell.  Do not scratch your rash. Do not pick at your blisters. To help you to not scratch: ? Keep your fingernails clean and cut short. ? Wear gloves or mittens when you sleep, if scratching is a problem. General instructions  Rest as told by your doctor.  Keep all follow-up visits as told by your doctor. This is important.  Wash your hands often with soap and water. If soap and water are not available, use hand sanitizer. Doing this lowers your chance of getting a skin infection   caused by germs (bacteria).  Your infection can cause chickenpox in people who have never had chickenpox or never got a shot of chickenpox vaccine. If you have blisters that did not change into scabs yet, try not to touch other people or be around other people, especially: ? Babies. ? Pregnant women. ? Children who have areas of red, itchy, or rough skin (eczema). ? Very old people who have transplants. ? People who have a long-term (chronic) sickness, like cancer or AIDS. Contact a doctor if:  Your pain does not get better with medicine.  Your pain does not get better after the rash heals.  You have any signs  of infection in the rash area. These signs include: ? More redness, swelling, or pain around the rash. ? Fluid or blood coming from the rash. ? The rash area feeling warm to the touch. ? Pus or a bad smell coming from the rash. Get help right away if:  The rash is on your face or nose.  You have pain in your face or pain by your eye.  You lose feeling on one side of your face.  You have trouble seeing.  You have ear pain, or you have ringing in your ear.  You have a loss of taste.  Your condition gets worse. Summary  Shingles gives you a painful skin rash and blisters that have fluid in them.  Shingles is an infection. It is caused by the same germ (virus) that causes chickenpox.  Keep your rash covered with a loose bandage (dressing). Wear loose clothing that does not rub on your rash.  If you have blisters that did not change into scabs yet, try not to touch other people or be around people. This information is not intended to replace advice given to you by your health care provider. Make sure you discuss any questions you have with your health care provider. Document Revised: 07/30/2018 Document Reviewed: 12/10/2016 Elsevier Patient Education  2020 Elsevier Inc.  

## 2020-01-06 ENCOUNTER — Telehealth: Payer: Self-pay | Admitting: Emergency Medicine

## 2020-01-06 NOTE — Telephone Encounter (Signed)
Pt is calling to let Aram Beecham and provider know that it is her son that is her caretaker/ He has been caring for her since day one . How would she go about paying him. Does she have to pay him ?   Pt states that Aram Beecham had discussed some sort of Home Health Nurse and no one has contacted the patient .   Please reach out to patient

## 2020-01-06 NOTE — Telephone Encounter (Signed)
Pt checking on status of this message. He has a [phone] insurance agent  cindy Zenaida Niece patten. He said she can help facilitate this process.

## 2020-01-06 NOTE — Telephone Encounter (Signed)
Please F/U with this  

## 2020-01-10 ENCOUNTER — Other Ambulatory Visit: Payer: Self-pay | Admitting: Emergency Medicine

## 2020-01-10 NOTE — Telephone Encounter (Signed)
Requested Prescriptions  Pending Prescriptions Disp Refills   nitroGLYCERIN (NITROSTAT) 0.4 MG SL tablet [Pharmacy Med Name: NITROGLYCERIN 0.4MG  SUB TAB 25] 25 tablet 0    Sig: PLACE 1 TABLET(0.4 MG TOTAL) UNDER THE TONGUE EVERY 5 MINUTES AS NEEDED FOR CHEST PAIN     Cardiovascular:  Nitrates Passed - 01/10/2020  3:18 AM      Passed - Last BP in normal range    BP Readings from Last 1 Encounters:  01/02/20 110/73         Passed - Last Heart Rate in normal range    Pulse Readings from Last 1 Encounters:  01/02/20 71         Passed - Valid encounter within last 12 months    Recent Outpatient Visits          1 week ago Herpes zoster ophthalmicus of left eye   [facility] [place], Eilleen Kempf, MD   3 weeks ago Dyspepsia   [facility] at [place], [place], MD   1 month ago Current moderate episode of major depressive disorder without prior episode [facility])   [facility] at [place], Eilleen Kempf, MD   2 months ago Upper abdominal pain   [facility] at [place], [place], MD   2 months ago Current moderate episode of major depressive disorder without prior episode [facility])   [facility] at [facility], Eilleen Kempf, MD      Future Appointments            In 2 weeks [name], MD [facility], [facility]   In 2 months Sagardia, Eilleen Kempf, MD [facility] at [place], [place]   In 5 months Sagardia, Eilleen Kempf, MD [facility] at [place], [facility]

## 2020-01-11 ENCOUNTER — Telehealth: Payer: Self-pay | Admitting: *Deleted

## 2020-01-11 NOTE — Telephone Encounter (Signed)
Pt would like you to call her back.

## 2020-01-11 NOTE — Telephone Encounter (Signed)
Called patient unable to reach, left message in mobile voice mail to return call.

## 2020-01-11 NOTE — Telephone Encounter (Signed)
Unable to reach patient left message in mobile voice mail to return call.

## 2020-01-11 NOTE — Telephone Encounter (Signed)
I have spoke to the patient. Thank you.

## 2020-01-11 NOTE — Telephone Encounter (Signed)
Spoke to patient concerning the question about her son (caregiver) being paid to take care of her. See telephone message in detail.

## 2020-01-11 NOTE — Telephone Encounter (Signed)
Returned call to patient concerning the question she had if her son (caregiver) can be paid to care for her. I advised the patient she would have to contact Simonne Maffucci at [facility] to speak to her concerning if her son can be paid. Also, I gave her the number to [facility] because she was faxing her credentials to [facility].

## 2020-01-13 ENCOUNTER — Encounter: Payer: Self-pay | Admitting: Emergency Medicine

## 2020-01-18 ENCOUNTER — Telehealth: Payer: Self-pay | Admitting: Emergency Medicine

## 2020-01-18 NOTE — Telephone Encounter (Signed)
Copied from CRM [id number]. Topic: General - Other >> Jan 18, 2020  4:55 PM Randol Kern wrote: Reason for CRM: Pt needs a call back from office/clinic. She needs to discuss the name of a medication that she uses for her eyes.  Best contact: [phone]

## 2020-01-19 ENCOUNTER — Other Ambulatory Visit: Payer: Self-pay | Admitting: Emergency Medicine

## 2020-01-19 MED ORDER — HYDROCODONE-ACETAMINOPHEN 5-325 MG PO TABS: 1.0000 | ORAL_TABLET | Freq: Four times a day (QID) | ORAL | 0 refills | Status: DC | PRN

## 2020-01-19 NOTE — Telephone Encounter (Signed)
Prescription sent

## 2020-01-19 NOTE — Telephone Encounter (Signed)
Spoke with pt and she stated that you were supposed to send her in some medication for the shingles I her eye. I see she had an OV on 01/02/2020 and you did address the shingles of the eye but I do not see of a medication for this and she said her eye and head hurts.  Please Advise

## 2020-01-19 NOTE — Assessment & Plan Note (Signed)
Please continue Breztri 2 puffs twice a day. Keep DuoNeb available to use up to every 6 hours if needed for shortness of breath, chest tightness, wheezing. You need to wear your oxygen at 2 L/min with exertion.  You do not need to use it at rest or while sleeping. Follow with Kathleen Cameron in 3 months or sooner if you have any problems.

## 2020-01-19 NOTE — Assessment & Plan Note (Signed)
We will plan to repeat your CT scan of the chest in December 2021.

## 2020-01-19 NOTE — Telephone Encounter (Signed)
Shingles medication for her eye was prescribed by her ophthalmologist.  If she is requesting medication for pain, we can take care of that.

## 2020-01-23 NOTE — Progress Notes (Signed)
Cardiology Office Note:    Date:  01/24/2020   ID:  Kathleen Cameron, DOB 05/22/1965, MRN [id number]  PCP:  [name], MD  Cardiologist:  [name], MD  Electrophysiologist:  None   Referring MD: [name], *   Chief Complaint  Patient presents with  . Coronary Artery Disease    History of Present Illness:    Kathleen Cameron is a 54 y.o. female with a hx of hypertension, severe obstructive pulmonary disease on home 2L O2 who presents for follow-up.  She was referred by Ames Dura, NP for evaluation of palpitations, with initial visit on 03/14/2019.  She was seen in the ED on 09/23/2018 with shortness of breath.  CTA was negative for PE but did show emphysema.  She was referred to pulmonology.  She reported that she has been experiencing palpitations and dyspnea with minimal exertion, so was referred to cardiology for evaluation.  Underwent an echocardiogram on 12/20/2018, which showed normal systolic function (EF greater than 65%, grade 1 diastolic dysfunction, mild LVH, normal RV function, no significant valvular disease, PASP 42, IVC small/colllapsible.  Cardiac monitor on 04/19/2019 showed no significant arrhythmias.  She was admitted to [facility] from 12/23 through 04/17/2019 after presenting with chest pain and shortness of breath.  She was treated for COPD exacerbation.  Troponin level increased to 372.  Cardiac catheterization was attempted but she was unable to lie flat.  She completed 3 days on heparin drip.  Outpatient coronary CTA was ordered.  Coronary CTA on 06/08/2019 showed calcium score 3 (77th percentile), nonobstructive CAD with calcified plaque in D1 causing 0-24% stenosis.    Since last clinic visit, she reports that she has been doing okay.  Recently treated for shingles.  She denies any chest pain.  Reports persistent dyspnea, has been at baseline.  She denies any lightheadedness or syncope.  Continues to have mild swelling in her ankles  and feet.  She has compression stockings but has not been wearing them.   Past Medical History:  Diagnosis Date  . Asthma   . COPD (chronic obstructive pulmonary disease) (HCC)   . Hypertension     No past surgical history on file.  Current Medications: Current Meds  Medication Sig  . acetaminophen (TYLENOL) 500 MG tablet Take 500 mg by mouth daily as needed for mild pain or headache.  . ALPRAZolam (XANAX) 0.25 MG tablet Take 1 tablet (0.25 mg total) by mouth 3 (three) times daily as needed for anxiety.  [name] alum & mag hydroxide-simeth (MAALOX/MYLANTA) 200-200-20 MG/5ML suspension Take 15 mLs by mouth every 6 (six) hours as needed for indigestion or heartburn.  [name] amLODipine (NORVASC) 5 MG tablet Take 1 tablet (5 mg total) by mouth daily.  [name] aspirin EC 81 MG EC tablet Take 1 tablet (81 mg total) by mouth daily.  . bisoprolol (ZEBETA) 10 MG tablet Take 1 tablet (10 mg total) by mouth daily.  . Budeson-Glycopyrrol-Formoterol (BREZTRI AEROSPHERE) 160-9-4.8 MCG/ACT AERO Inhale 2 puffs into the lungs in the morning and at bedtime.  . Budeson-Glycopyrrol-Formoterol (BREZTRI AEROSPHERE) 160-9-4.8 MCG/ACT AERO Inhale 2 puffs into the lungs in the morning and at bedtime.  [name] escitalopram (LEXAPRO) 20 MG tablet Take 1 tablet (20 mg total) by mouth daily.  [name] guaiFENesin (MUCINEX) 600 MG 12 hr tablet Take 600 mg by mouth daily as needed for cough or to loosen phlegm.  . hydrochlorothiazide (HYDRODIURIL) 25 MG tablet Take 25 mg by mouth daily.  [name] HYDROcodone-acetaminophen (NORCO) 5-325 MG  tablet Take 1 tablet by mouth every 6 (six) hours as needed.  [name] ipratropium-albuterol (DUONEB) 0.5-2.5 (3) MG/3ML SOLN Take 3 mLs by nebulization every 6 (six) hours as needed. (Patient taking differently: Take 3 mLs by nebulization every 6 (six) hours as needed (Shortness of Breath). )  . nitroGLYCERIN (NITROSTAT) 0.4 MG SL tablet PLACE 1 TABLET(0.4 MG TOTAL) UNDER THE TONGUE EVERY 5 MINUTES AS NEEDED FOR CHEST PAIN  .  pantoprazole (PROTONIX) 40 MG tablet Take 1 tablet (40 mg total) by mouth daily.     Allergies:   Stiolto respimat [tiotropium bromide-olodaterol]   Social History   Socioeconomic History  . Marital status: Married    Spouse name: Not on file  . Number of children: Not on file  . Years of education: Not on file  . Highest education level: Not on file  Occupational History  . Not on file  Tobacco Use  . Smoking status: Never Smoker  . Smokeless tobacco: Never Used  Substance and Sexual Activity  . Alcohol use: Yes    Alcohol/week: 2.0 standard drinks    Types: 2 Cans of beer per week  . Drug use: Never  . Sexual activity: Not on file  Other Topics Concern  . Not on file  Social History Narrative  . Not on file   Social Determinants of Health   Financial Resource Strain:   . Difficulty of Paying Living Expenses: Not on file  Food Insecurity:   . Worried About Programme researcher, broadcasting/film/video in the Last Year: Not on file  . Ran Out of Food in the Last Year: Not on file  Transportation Needs:   . Lack of Transportation (Medical): Not on file  . Lack of Transportation (Non-Medical): Not on file  Physical Activity:   . Days of Exercise per Week: Not on file  . Minutes of Exercise per Session: Not on file  Stress:   . Feeling of Stress : Not on file  Social Connections:   . Frequency of Communication with Friends and Family: Not on file  . Frequency of Social Gatherings with Friends and Family: Not on file  . Attends Religious Services: Not on file  . Active Member of Clubs or Organizations: Not on file  . Attends Banker Meetings: Not on file  . Marital Status: Not on file     Family History: The patient's Family history is unknown by patient.  ROS:   Please see the history of present illness.    All other systems reviewed and are negative.  EKGs/Labs/Other Studies Reviewed:    The following studies were reviewed today:   EKG:  EKG is not ordered today.   The ekg ordered most recetnly demonstrates normal sinus rhythm, rate 92, poor R wave progression, T wave flattening inlimb leads  TTE 12/20/18:  1. The left ventricle has hyperdynamic systolic function, with an ejection fraction of >65%. The cavity size was normal. There is mildly increased left ventricular wall thickness. Left ventricular diastolic Doppler parameters are consistent with  impaired relaxation. No evidence of left ventricular regional wall motion abnormalities.  2. The aortic root is normal in size and structure.  3. The aortic valve is tricuspid. No stenosis of the aortic valve.  4. Trivial pericardial effusion is present.  5. The right ventricle has normal systolic function. The cavity was normal. There is no increase in right ventricular wall thickness.  6. Normal IVC size with PA systolic pressure 42 mmHg.  7. No  evidence of mitral valve stenosis. No significant mitral regurgitation.  Cardiac monitor 04/19/19:  No significant arrhythmias detected 3 days of data recorded on Zio monitor. Patient had a min HR of 60 bpm, max HR of 129 bpm, and avg HR of 79 bpm. Predominant underlying rhythm was Sinus Rhythm. No VT, SVT, atrial fibrillation, high degree block, or pauses noted. Isolated atrial and ventricular ectopy was rare (<1%). There were 0 triggered events. No significant arrhythmias detected.  TTE 04/14/19: 1. Left ventricular ejection fraction, by visual estimation, is 60 to  65%. The left ventricle has normal function. There is no left ventricular  hypertrophy.  2. Left ventricular diastolic parameters are consistent with Grade I  diastolic dysfunction (impaired relaxation).  3. The left ventricle has no regional wall motion abnormalities.  4. Global right ventricle has normal systolic function.The right  ventricular size is normal. No increase in right ventricular wall  thickness.  5. Left atrial size was normal.  6. Right atrial size was normal.  7. The  mitral valve is normal in structure. No evidence of mitral valve  regurgitation. No evidence of mitral stenosis.  8. The tricuspid valve is normal in structure.  9. The aortic valve is normal in structure. Aortic valve regurgitation is  not visualized. No evidence of aortic valve sclerosis or stenosis.  10. The pulmonic valve was normal in structure. Pulmonic valve  regurgitation is not visualized.  11. TR signal is inadequate for assessing pulmonary artery systolic  pressure.  12. The inferior vena cava is normal in size with greater than 50%  respiratory variability, suggesting right atrial pressure of 3 mmHg.   Recent Labs: 04/13/2019: Magnesium 1.9 09/06/2019: BNP 32.2 11/05/2019: ALT 20 11/10/2019: BUN 31; Creatinine, Ser 0.96; Hemoglobin 11.9; Platelets 256; Potassium 4.4; Sodium 134  Recent Lipid Panel    Component Value Date/Time   CHOL 193 04/13/2019 0640   CHOL 300 (H) 03/10/2018 1033   TRIG 85 04/13/2019 0640   HDL 68 04/13/2019 0640   HDL 72 03/10/2018 1033   CHOLHDL 2.8 04/13/2019 0640   VLDL 17 04/13/2019 0640   LDLCALC 108 (H) 04/13/2019 0640   LDLCALC 206 (H) 03/10/2018 1033    Physical Exam:    VS:  BP 130/82   Pulse (!) 58   Ht 5\' 4"  (1.626 m)   Wt 116 lb (52.6 kg)   SpO2 100%   BMI 19.91 kg/m     Wt Readings from Last 3 Encounters:  01/24/20 116 lb (52.6 kg)  01/02/20 106 lb 12.8 oz (48.4 kg)  12/14/19 106 lb 12.8 oz (48.4 kg)     GEN:  in no acute distress, on O2 HEENT: Normal NECK: No JVD CARDIAC: RRR, no murmurs, rubs, gallops RESPIRATORY: Diminished breath sounds ABDOMEN: Soft, non-tender, non-distended MUSCULOSKELETAL: Mild non-pitting edema in ankles/top of feet SKIN: Warm and dry NEUROLOGIC:  Alert and oriented x 3 PSYCHIATRIC:  Normal affect   ASSESSMENT:    1. Coronary artery disease involving native coronary artery of native heart without angina pectoris   2. Dyslipidemia   3. Palpitations   4. Bilateral leg edema   5.  Essential hypertension    PLAN:    CAD: had admission 03/2019 with chest pain and mild troponin elevation (high-sensitivity troponin 372), but occurred in setting of COPD exacerbation.  She was unable to lie flat for cardiac catheterization.  Coronary CTA on 06/08/2019 showed calcium score 3 (77th percentile), nonobstructive CAD with calcified plaque in D1 causing 0-24% stenosis. -Continued  crestor 20 mg daily.  Will check lipid panel  Palpitations: No significant arrhythmia on Zio patch  Hypertension: On bisoprolol 10 mg daily and amlodipine 5 mg daily.  Appears controlled  Lower extremity edema: mild non-pitting edema in ankles/top of feet.  BNP normal at last clinic visit.  Normal albumin.  Suspect secondary to amlodipine use.  Not causing patient any symptoms.  Recommend leg elevation and use of compression stockings while awake  Hyperlipidemia: On rosuvastatin 20 mg daily.  LDL 108 on 04/13/2019, significantly improved from 206 in 2019.  Will recheck lipid panel and titrate statin for goal LDL less than 70  RTC in 6 months   Medication Adjustments/Labs and Tests Ordered: Current medicines are reviewed at length with the patient today.  Concerns regarding medicines are outlined above.  Orders Placed This Encounter  Procedures  . Lipid panel   No orders of the defined types were placed in this encounter.   Patient Instructions  Medication Instructions:  Your physician recommends that you continue on your current medications as directed. Please refer to the Current Medication list given to you today.  *If you need a refill on your cardiac medications before your next appointment, please call your pharmacy*   Lab Work: Lipid panel  If you have labs (blood work) drawn today and your tests are completely normal, you will receive your results only by: [name]. MyChart Message (if you have MyChart) OR . A paper copy in the mail If you have any lab test that is abnormal or we need to  change your treatment, we will call you to review the results.  Follow-Up: At [facility], you and your health needs are our priority.  As part of our continuing mission to provide you with exceptional heart care, we have created designated Provider Care Teams.  These Care Teams include your primary Cardiologist (physician) and Advanced Practice Providers (APPs -  Physician Assistants and Nurse Practitioners) who all work together to provide you with the care you need, when you need it.  We recommend signing up for the patient portal called "MyChart".  Sign up information is provided on this After Visit Summary.  MyChart is used to connect with patients for Virtual Visits (Telemedicine).  Patients are able to view lab/test results, encounter notes, upcoming appointments, etc.  Non-urgent messages can be sent to your provider as well.   To learn more about what you can do with MyChart, go to [email or web address].    Your next appointment:   6 month(s)  The format for your next appointment:   In Person  Provider:   You will see one of the following Advanced Practice Providers on your designated Care Team:    Theodore DemarkRhonda Barrett, PA-C  Joni ReiningKathryn Lawrence, DNP, ANP  Then, Little Ishikawahristopher L Sherrod Toothman, MD will plan to see you again in 12 month(s).        Signed, Little Ishikawahristopher L Tasheba Henson, MD  01/24/2020 4:43 PM    [facility]

## 2020-01-24 ENCOUNTER — Ambulatory Visit (INDEPENDENT_AMBULATORY_CARE_PROVIDER_SITE_OTHER): Payer: Commercial Managed Care - PPO | Admitting: Cardiology

## 2020-01-24 ENCOUNTER — Encounter: Payer: Self-pay | Admitting: Cardiology

## 2020-01-24 ENCOUNTER — Other Ambulatory Visit: Payer: Self-pay

## 2020-01-24 VITALS — BP 130/82 | HR 58 | Ht 64.0 in | Wt 116.0 lb

## 2020-01-24 DIAGNOSIS — R002 Palpitations: Secondary | ICD-10-CM

## 2020-01-24 DIAGNOSIS — I2510 Atherosclerotic heart disease of native coronary artery without angina pectoris: Secondary | ICD-10-CM

## 2020-01-24 DIAGNOSIS — E785 Hyperlipidemia, unspecified: Secondary | ICD-10-CM

## 2020-01-24 DIAGNOSIS — I10 Essential (primary) hypertension: Secondary | ICD-10-CM

## 2020-01-24 DIAGNOSIS — R600 Localized edema: Secondary | ICD-10-CM

## 2020-01-24 NOTE — Patient Instructions (Signed)
Medication Instructions:  Your physician recommends that you continue on your current medications as directed. Please refer to the Current Medication list given to you today.  *If you need a refill on your cardiac medications before your next appointment, please call your pharmacy*   Lab Work: Lipid panel  If you have labs (blood work) drawn today and your tests are completely normal, you will receive your results only by:  MyChart Message (if you have MyChart) OR  A paper copy in the mail If you have any lab test that is abnormal or we need to change your treatment, we will call you to review the results.  Follow-Up: At [facility], you and your health needs are our priority.  As part of our continuing mission to provide you with exceptional heart care, we have created designated Provider Care Teams.  These Care Teams include your primary Cardiologist (physician) and Advanced Practice Providers (APPs -  Physician Assistants and Nurse Practitioners) who all work together to provide you with the care you need, when you need it.  We recommend signing up for the patient portal called "MyChart".  Sign up information is provided on this After Visit Summary.  MyChart is used to connect with patients for Virtual Visits (Telemedicine).  Patients are able to view lab/test results, encounter notes, upcoming appointments, etc.  Non-urgent messages can be sent to your provider as well.   To learn more about what you can do with MyChart, go to [email or web address].    Your next appointment:   6 month(s)  The format for your next appointment:   In Person  Provider:   You will see one of the following Advanced Practice Providers on your designated Care Team:    Theodore Demark, PA-C  Joni Reining, DNP, ANP  Then, [name], MD will plan to see you again in 12 month(s).

## 2020-01-25 ENCOUNTER — Encounter: Payer: Self-pay | Admitting: Emergency Medicine

## 2020-01-25 ENCOUNTER — Other Ambulatory Visit: Payer: Self-pay | Admitting: *Deleted

## 2020-01-25 LAB — LIPID PANEL
Chol/HDL Ratio: 2.1 ratio (ref 0.0–4.4)
Cholesterol, Total: 228 mg/dL — ABNORMAL HIGH (ref 100–199)
HDL: 110 mg/dL (ref >39)
LDL Chol Calc (NIH): 107 mg/dL — ABNORMAL HIGH (ref 0–99)
Triglycerides: 65 mg/dL (ref 0–149)
VLDL Cholesterol Cal: 11 mg/dL (ref 5–40)

## 2020-01-25 MED ORDER — ROSUVASTATIN CALCIUM 40 MG PO TABS: 40.0000 mg | ORAL_TABLET | Freq: Every day | ORAL | 3 refills | Status: DC

## 2020-01-28 ENCOUNTER — Inpatient Hospital Stay (HOSPITAL_COMMUNITY)
Admission: EM | Admit: 2020-01-28 | Discharge: 2020-02-02 | DRG: 640 | Disposition: A | Discharge status: Home Health | Payer: PRIVATE HEALTH INSURANCE | Attending: Internal Medicine | Admitting: Internal Medicine

## 2020-01-28 ENCOUNTER — Encounter (HOSPITAL_COMMUNITY): Payer: Self-pay

## 2020-01-28 ENCOUNTER — Emergency Department (HOSPITAL_COMMUNITY): Payer: PRIVATE HEALTH INSURANCE

## 2020-01-28 DIAGNOSIS — J441 Chronic obstructive pulmonary disease with (acute) exacerbation: Secondary | ICD-10-CM | POA: Diagnosis present

## 2020-01-28 DIAGNOSIS — E878 Other disorders of electrolyte and fluid balance, not elsewhere classified: Secondary | ICD-10-CM | POA: Diagnosis present

## 2020-01-28 DIAGNOSIS — R0600 Dyspnea, unspecified: Secondary | ICD-10-CM

## 2020-01-28 DIAGNOSIS — F419 Anxiety disorder, unspecified: Secondary | ICD-10-CM | POA: Diagnosis present

## 2020-01-28 DIAGNOSIS — Z888 Allergy status to other drugs, medicaments and biological substances status: Secondary | ICD-10-CM

## 2020-01-28 DIAGNOSIS — E871 Hypo-osmolality and hyponatremia: Principal | ICD-10-CM | POA: Diagnosis present

## 2020-01-28 DIAGNOSIS — I444 Left anterior fascicular block: Secondary | ICD-10-CM | POA: Diagnosis present

## 2020-01-28 DIAGNOSIS — Z7982 Long term (current) use of aspirin: Secondary | ICD-10-CM

## 2020-01-28 DIAGNOSIS — J9611 Chronic respiratory failure with hypoxia: Secondary | ICD-10-CM | POA: Diagnosis present

## 2020-01-28 DIAGNOSIS — I10 Essential (primary) hypertension: Secondary | ICD-10-CM | POA: Diagnosis present

## 2020-01-28 DIAGNOSIS — Z20822 Contact with and (suspected) exposure to covid-19: Secondary | ICD-10-CM | POA: Diagnosis present

## 2020-01-28 DIAGNOSIS — J9612 Chronic respiratory failure with hypercapnia: Secondary | ICD-10-CM | POA: Diagnosis present

## 2020-01-28 DIAGNOSIS — E861 Hypovolemia: Secondary | ICD-10-CM | POA: Diagnosis present

## 2020-01-28 DIAGNOSIS — R001 Bradycardia, unspecified: Secondary | ICD-10-CM | POA: Diagnosis present

## 2020-01-28 DIAGNOSIS — F32A Depression, unspecified: Secondary | ICD-10-CM | POA: Diagnosis present

## 2020-01-28 DIAGNOSIS — G9341 Metabolic encephalopathy: Secondary | ICD-10-CM | POA: Diagnosis present

## 2020-01-28 DIAGNOSIS — Z9981 Dependence on supplemental oxygen: Secondary | ICD-10-CM

## 2020-01-28 DIAGNOSIS — R609 Edema, unspecified: Secondary | ICD-10-CM | POA: Diagnosis not present

## 2020-01-28 DIAGNOSIS — Z79899 Other long term (current) drug therapy: Secondary | ICD-10-CM | POA: Diagnosis not present

## 2020-01-28 DIAGNOSIS — E039 Hypothyroidism, unspecified: Secondary | ICD-10-CM

## 2020-01-28 DIAGNOSIS — T502X5A Adverse effect of carbonic-anhydrase inhibitors, benzothiadiazides and other diuretics, initial encounter: Secondary | ICD-10-CM | POA: Diagnosis present

## 2020-01-28 DIAGNOSIS — R4182 Altered mental status, unspecified: Secondary | ICD-10-CM | POA: Diagnosis present

## 2020-01-28 DIAGNOSIS — E876 Hypokalemia: Secondary | ICD-10-CM | POA: Diagnosis present

## 2020-01-28 DIAGNOSIS — J449 Chronic obstructive pulmonary disease, unspecified: Secondary | ICD-10-CM | POA: Diagnosis not present

## 2020-01-28 DIAGNOSIS — E785 Hyperlipidemia, unspecified: Secondary | ICD-10-CM | POA: Diagnosis present

## 2020-01-28 DIAGNOSIS — U071 COVID-19: Secondary | ICD-10-CM | POA: Diagnosis not present

## 2020-01-28 DIAGNOSIS — M7989 Other specified soft tissue disorders: Secondary | ICD-10-CM | POA: Diagnosis not present

## 2020-01-28 DIAGNOSIS — R109 Unspecified abdominal pain: Secondary | ICD-10-CM

## 2020-01-28 DIAGNOSIS — R1011 Right upper quadrant pain: Secondary | ICD-10-CM

## 2020-01-28 LAB — URINALYSIS, ROUTINE W REFLEX MICROSCOPIC
Bacteria, UA: NONE SEEN
Bilirubin Urine: NEGATIVE
Glucose, UA: NEGATIVE mg/dL
Hgb urine dipstick: NEGATIVE
Ketones, ur: NEGATIVE mg/dL
Leukocytes,Ua: NEGATIVE
Nitrite: NEGATIVE
Protein, ur: 30 mg/dL — AB
Specific Gravity, Urine: 1.015 (ref 1.005–1.030)
pH: 6 (ref 5.0–8.0)

## 2020-01-28 LAB — I-STAT ARTERIAL BLOOD GAS, ED
Acid-Base Excess: 23 mmol/L — ABNORMAL HIGH (ref 0.0–2.0)
Bicarbonate: 53.1 mmol/L — ABNORMAL HIGH (ref 20.0–28.0)
Calcium, Ion: 1.16 mmol/L (ref 1.15–1.40)
HCT: 36 % (ref 36.0–46.0)
Hemoglobin: 12.2 g/dL (ref 12.0–15.0)
O2 Saturation: 91 %
Potassium: 3.3 mmol/L — ABNORMAL LOW (ref 3.5–5.1)
Sodium: 119 mmol/L — CL (ref 135–145)
TCO2: >50 mmol/L — ABNORMAL HIGH (ref 22–32)
pCO2 arterial: 90.1 mmHg (ref 32.0–48.0)
pH, Arterial: 7.379 (ref 7.350–7.450)
pO2, Arterial: 67 mmHg — ABNORMAL LOW (ref 83.0–108.0)

## 2020-01-28 LAB — CBC WITH DIFFERENTIAL/PLATELET
Abs Immature Granulocytes: 0.03 10*3/uL (ref 0.00–0.07)
Basophils Absolute: 0 10*3/uL (ref 0.0–0.1)
Basophils Relative: 1 %
Eosinophils Absolute: 0.1 10*3/uL (ref 0.0–0.5)
Eosinophils Relative: 2 %
HCT: 34.2 % — ABNORMAL LOW (ref 36.0–46.0)
Hemoglobin: 10.9 g/dL — ABNORMAL LOW (ref 12.0–15.0)
Immature Granulocytes: 1 %
Lymphocytes Relative: 13 %
Lymphs Abs: 0.9 10*3/uL (ref 0.7–4.0)
MCH: 28.8 pg (ref 26.0–34.0)
MCHC: 31.9 g/dL (ref 30.0–36.0)
MCV: 90.2 fL (ref 80.0–100.0)
Monocytes Absolute: 0.4 10*3/uL (ref 0.1–1.0)
Monocytes Relative: 5 %
Neutro Abs: 5.1 10*3/uL (ref 1.7–7.7)
Neutrophils Relative %: 78 %
Platelets: 265 10*3/uL (ref 150–400)
RBC: 3.79 MIL/uL — ABNORMAL LOW (ref 3.87–5.11)
RDW: 13.3 % (ref 11.5–15.5)
WBC: 6.5 10*3/uL (ref 4.0–10.5)
nRBC: 0 % (ref 0.0–0.2)

## 2020-01-28 LAB — COMPREHENSIVE METABOLIC PANEL
ALT: 17 U/L (ref 0–44)
AST: 34 U/L (ref 15–41)
Albumin: 3.8 g/dL (ref 3.5–5.0)
Alkaline Phosphatase: 51 U/L (ref 38–126)
Anion gap: 14 (ref 5–15)
BUN: 11 mg/dL (ref 6–20)
CO2: 37 mmol/L — ABNORMAL HIGH (ref 22–32)
Calcium: 9.3 mg/dL (ref 8.9–10.3)
Chloride: 72 mmol/L — ABNORMAL LOW (ref 98–111)
Creatinine, Ser: 0.73 mg/dL (ref 0.44–1.00)
GFR, Estimated: >60 mL/min (ref >60)
Glucose, Bld: 127 mg/dL — ABNORMAL HIGH (ref 70–99)
Potassium: 3.4 mmol/L — ABNORMAL LOW (ref 3.5–5.1)
Sodium: 123 mmol/L — ABNORMAL LOW (ref 135–145)
Total Bilirubin: 0.6 mg/dL (ref 0.3–1.2)
Total Protein: 7.6 g/dL (ref 6.5–8.1)

## 2020-01-28 LAB — BASIC METABOLIC PANEL
Anion gap: 11 (ref 5–15)
BUN: 9 mg/dL (ref 6–20)
CO2: 41 mmol/L — ABNORMAL HIGH (ref 22–32)
Calcium: 9.4 mg/dL (ref 8.9–10.3)
Chloride: 70 mmol/L — ABNORMAL LOW (ref 98–111)
Creatinine, Ser: 0.73 mg/dL (ref 0.44–1.00)
GFR, Estimated: >60 mL/min (ref >60)
Glucose, Bld: 101 mg/dL — ABNORMAL HIGH (ref 70–99)
Potassium: 3.8 mmol/L (ref 3.5–5.1)
Sodium: 122 mmol/L — ABNORMAL LOW (ref 135–145)

## 2020-01-28 LAB — RESPIRATORY PANEL BY RT PCR (FLU A&B, COVID)
Influenza A by PCR: NEGATIVE
Influenza B by PCR: NEGATIVE
SARS Coronavirus 2 by RT PCR: NEGATIVE

## 2020-01-28 LAB — RAPID URINE DRUG SCREEN, HOSP PERFORMED
Amphetamines: NOT DETECTED
Barbiturates: NOT DETECTED
Benzodiazepines: NOT DETECTED
Cocaine: NOT DETECTED
Opiates: NOT DETECTED
Tetrahydrocannabinol: NOT DETECTED

## 2020-01-28 LAB — OSMOLALITY, URINE: Osmolality, Ur: 534 mOsm/kg (ref 300–900)

## 2020-01-28 LAB — OSMOLALITY: Osmolality: 269 mOsm/kg — ABNORMAL LOW (ref 275–295)

## 2020-01-28 LAB — ETHANOL: Alcohol, Ethyl (B): <10 mg/dL (ref <10)

## 2020-01-28 LAB — TSH: TSH: 27.195 u[IU]/mL — ABNORMAL HIGH (ref 0.350–4.500)

## 2020-01-28 LAB — MAGNESIUM: Magnesium: 1.5 mg/dL — ABNORMAL LOW (ref 1.7–2.4)

## 2020-01-28 LAB — TROPONIN I (HIGH SENSITIVITY)
Troponin I (High Sensitivity): 7 ng/L (ref <18)
Troponin I (High Sensitivity): 8 ng/L (ref <18)

## 2020-01-28 LAB — SODIUM, URINE, RANDOM: Sodium, Ur: 137 mmol/L

## 2020-01-28 LAB — BRAIN NATRIURETIC PEPTIDE: B Natriuretic Peptide: 106.8 pg/mL — ABNORMAL HIGH (ref 0.0–100.0)

## 2020-01-28 MED ORDER — BISOPROLOL FUMARATE 5 MG PO TABS
10.0000 mg | ORAL_TABLET | Freq: Every day | ORAL | Status: DC
  Administered 2020-01-29 – 2020-02-02 (×5): 10 mg via ORAL
  Filled 2020-01-28 (×5): qty 2
  Filled 2020-01-28: qty 1

## 2020-01-28 MED ORDER — ENALAPRILAT 1.25 MG/ML IV SOLN
1.2500 mg | Freq: Once | INTRAVENOUS | Status: DC
  Filled 2020-01-28: qty 1

## 2020-01-28 MED ORDER — PREDNISOLONE ACETATE 1 % OP SUSP: 1.0000 [drp] | Freq: Four times a day (QID) | OPHTHALMIC | Status: DC

## 2020-01-28 MED ORDER — ROSUVASTATIN CALCIUM 20 MG PO TABS
20.0000 mg | ORAL_TABLET | Freq: Every day | ORAL | Status: DC
  Administered 2020-01-29 – 2020-02-02 (×5): 20 mg via ORAL
  Filled 2020-01-28 (×5): qty 1

## 2020-01-28 MED ORDER — POLYETHYLENE GLYCOL 3350 17 G PO PACK: 17.0000 g | PACK | Freq: Every day | ORAL | Status: DC | PRN

## 2020-01-28 MED ORDER — POTASSIUM CHLORIDE IN NACL 40-0.9 MEQ/L-% IV SOLN
INTRAVENOUS | Status: AC
  Filled 2020-01-28 (×3): qty 1000

## 2020-01-28 MED ORDER — AMLODIPINE BESYLATE 5 MG PO TABS
5.0000 mg | ORAL_TABLET | Freq: Every day | ORAL | Status: DC
  Filled 2020-01-28: qty 1

## 2020-01-28 MED ORDER — SODIUM CHLORIDE 0.9 % IV SOLN: INTRAVENOUS | Status: DC

## 2020-01-28 MED ORDER — BUDESON-GLYCOPYRROL-FORMOTEROL 160-9-4.8 MCG/ACT IN AERO: 1.0000 | INHALATION_SPRAY | RESPIRATORY_TRACT | Status: DC

## 2020-01-28 MED ORDER — ESCITALOPRAM OXALATE 10 MG PO TABS
10.0000 mg | ORAL_TABLET | Freq: Every day | ORAL | Status: DC
  Administered 2020-01-29 – 2020-02-02 (×5): 10 mg via ORAL
  Filled 2020-01-28 (×5): qty 1

## 2020-01-28 MED ORDER — IPRATROPIUM-ALBUTEROL 0.5-2.5 (3) MG/3ML IN SOLN
3.0000 mL | Freq: Four times a day (QID) | RESPIRATORY_TRACT | Status: DC | PRN
  Administered 2020-01-31: 3 mL via RESPIRATORY_TRACT
  Filled 2020-01-28: qty 3

## 2020-01-28 MED ORDER — VALACYCLOVIR HCL 500 MG PO TABS
1000.0000 mg | ORAL_TABLET | Freq: Every day | ORAL | Status: DC
  Administered 2020-01-29 – 2020-02-02 (×5): 1000 mg via ORAL
  Filled 2020-01-28 (×5): qty 2

## 2020-01-28 MED ORDER — ASPIRIN EC 81 MG PO TBEC
81.0000 mg | DELAYED_RELEASE_TABLET | Freq: Every day | ORAL | Status: DC
  Administered 2020-01-29 – 2020-02-02 (×5): 81 mg via ORAL
  Filled 2020-01-28 (×5): qty 1

## 2020-01-28 MED ORDER — METHYLPREDNISOLONE SODIUM SUCC 125 MG IJ SOLR
125.0000 mg | Freq: Once | INTRAMUSCULAR | Status: AC
  Administered 2020-01-28: 125 mg via INTRAVENOUS
  Filled 2020-01-28: qty 2

## 2020-01-28 MED ORDER — ALBUTEROL SULFATE (2.5 MG/3ML) 0.083% IN NEBU
3.0000 mL | INHALATION_SOLUTION | RESPIRATORY_TRACT | Status: DC | PRN
  Administered 2020-01-29: 3 mL via RESPIRATORY_TRACT
  Filled 2020-01-28: qty 3

## 2020-01-28 MED ORDER — NITROGLYCERIN 0.4 MG SL SUBL
0.4000 mg | SUBLINGUAL_TABLET | SUBLINGUAL | Status: DC | PRN
  Administered 2020-01-31: 0.4 mg via SUBLINGUAL
  Filled 2020-01-28 (×2): qty 1

## 2020-01-28 MED ORDER — ENOXAPARIN SODIUM 40 MG/0.4ML ~~LOC~~ SOLN
40.0000 mg | SUBCUTANEOUS | Status: DC
  Administered 2020-01-28 – 2020-02-01 (×5): 40 mg via SUBCUTANEOUS
  Filled 2020-01-28 (×5): qty 0.4

## 2020-01-28 MED ORDER — ALBUTEROL SULFATE HFA 108 (90 BASE) MCG/ACT IN AERS
2.0000 | INHALATION_SPRAY | Freq: Once | RESPIRATORY_TRACT | Status: AC
  Administered 2020-01-28: 2 via RESPIRATORY_TRACT
  Filled 2020-01-28: qty 6.7

## 2020-01-28 NOTE — ED Triage Notes (Signed)
Pt arrived by EMS from home c/o altered mental status for the past 48hrs. Pt states she has been having no appetite, dropping things and feeling tired. Also states she is seeing people and having strange conversation with them   Pt is oriented to self, place and situatio9n but does not answer all questions appropriately.   Hx of COPD on 2L [place] at home

## 2020-01-28 NOTE — H&P (Signed)
History and Physical   Kathleen Cameron ZGY:[id number] DOB: 03-26-1966 DOA: 01/28/2020  PCP: [name], MD  Patient coming from: home   I have personally briefly reviewed patient's old medical records in [facility] EMR.  Chief Concern: Lethargy  HPI: Kathleen Cameron is a 54 y.o. female with medical history significant for HTN, HLD, COPD, anxiety/depression presents via EMS from home for chief concern of excessive sleepiness and poor PO intake.   History was obtained via ED provider and spouse over the phone.  At bedside, Kathleen Cameron was excessively sleeping, breathing, and difficult to arouse with firm sternal rub. Vitals showed sinus bradycardia with rate of 50-55, spO298-100% on 2 L [place], RR of 18-22, and afebrile with SBP in the 140-160s.   Per patient's spouse over the phone, he called EMS because for the last two days, Kathleen Cameron has been sleeping excessively. And for the last 5-7 days, she has had poor coordination (with dropping things) and poor PO intake.  Patient spouse denies patient having fever, chills, cough, congestion, chest pain, changed shortness of breath from baseline, abdominal pain. He endorsed she had increased urination and at baseline had constipation.   ED Course: Discussed with ED provider. 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 CXR was negative for acute cardiopulmonary process.  Review of Systems: Unable to obtain, due to excessive drowsiness  Past Medical History:  Diagnosis Date  . Asthma   . COPD (chronic obstructive pulmonary disease) (HCC)   . Hypertension    History reviewed. No pertinent surgical history.  Social History:  reports that she has never smoked. She has never used smokeless tobacco. She reports current alcohol use of about 2.0 standard drinks of alcohol per week. She reports that she does not use drugs.  Allergies  Allergen Reactions  . Stiolto Respimat [Tiotropium  Bromide-Olodaterol] Other (See Comments)    Severe headaches and rapid heartrate   Family History  Family history unknown: Yes   Family history: Family history reviewed and not pertinent  Prior to Admission medications   Medication Sig Start Date End Date Taking? Authorizing Provider  acetaminophen (TYLENOL) 500 MG tablet Take 500 mg by mouth daily as needed for mild pain or headache.   Yes [provider]  albuterol (VENTOLIN HFA) 108 (90 Base) MCG/ACT inhaler Inhale into the lungs every 6 (six) hours as needed for wheezing or shortness of breath.   Yes [provider]  ALPRAZolam (XANAX) 0.25 MG tablet Take 1 tablet (0.25 mg total) by mouth 3 (three) times daily as needed for anxiety. Patient taking differently: Take 0.25 mg by mouth 3 (three) times daily as needed for anxiety (shingles pain).  01/02/20  Yes Sagardia, Eilleen Kempf, MD  alum & mag hydroxide-simeth (MAALOX/MYLANTA) 200-200-20 MG/5ML suspension Take 15 mLs by mouth every 6 (six) hours as needed for indigestion or heartburn. 11/12/19  Yes Shah, Pratik D, DO  amLODipine (NORVASC) 5 MG tablet Take 1 tablet (5 mg total) by mouth daily. 08/09/19 02/19/20 Yes SagardiaEilleen Kempf, MD  aspirin EC 81 MG EC tablet Take 1 tablet (81 mg total) by mouth daily. 04/17/19  Yes [name], MD  bisoprolol (ZEBETA) 10 MG tablet Take 1 tablet (10 mg total) by mouth daily. 10/20/19  Yes Sagardia, Eilleen Kempf, MD  Budeson-Glycopyrrol-Formoterol (BREZTRI AEROSPHERE) 160-9-4.8 MCG/ACT AERO Inhale 2 puffs into the lungs in the morning and at bedtime. Patient taking differently: Inhale 1 puff into the lungs See admin instructions. Inhale  1 puff into the lungs when walking to bathroom - 6-7 times daily 12/02/19  Yes Glenford BayleyWalsh, Elizabeth W, NP  Dextromethorphan-guaiFENesin [facility](MUCINEX DM MAXIMUM STRENGTH) 60-1200 MG TB12 Take 1 tablet by mouth 2 (two) times daily as needed (congestion).   Yes [provider]  escitalopram (LEXAPRO) 10 MG  tablet Take 10 mg by mouth daily. 01/02/20  Yes [provider]  famotidine (PEPCID) 10 MG tablet Take 10 mg by mouth 2 (two) times daily as needed for heartburn or indigestion (acid reflux).   Yes [provider]  hydrochlorothiazide (HYDRODIURIL) 25 MG tablet Take 25 mg by mouth daily. 09/07/19  Yes [provider]  ipratropium-albuterol (DUONEB) 0.5-2.5 (3) MG/3ML SOLN Take 3 mLs by nebulization every 6 (six) hours as needed. Patient taking differently: Take 3 mLs by nebulization every 6 (six) hours as needed (Shortness of Breath).  05/03/19  Yes Glenford BayleyWalsh, Elizabeth W, NP  nitroGLYCERIN (NITROSTAT) 0.4 MG SL tablet PLACE 1 TABLET(0.4 MG TOTAL) UNDER THE TONGUE EVERY 5 MINUTES AS NEEDED FOR CHEST PAIN Patient taking differently: Place 0.4 mg under the tongue every 5 (five) minutes as needed for chest pain.  01/10/20  Yes SagardiaEilleen Kempf, Miguel Jose, MD  OXYGEN Inhale 2 L into the lungs continuous.   Yes [provider]  pantoprazole (PROTONIX) 40 MG tablet Take 1 tablet (40 mg total) by mouth daily. 10/27/19  Yes Sagardia, Eilleen KempfMiguel Jose, MD  polyethylene glycol (MIRALAX / GLYCOLAX) 17 g packet Take 17 g by mouth daily as needed (constipation).   Yes [provider]  prednisoLONE acetate (PRED FORTE) 1 % ophthalmic suspension Place 1 drop into the left eye 4 (four) times daily. 01/10/20  Yes [provider]  rosuvastatin (CRESTOR) 20 MG tablet Take 20 mg by mouth daily.   Yes [provider]  valACYclovir (VALTREX) 1000 MG tablet Take 1,000 mg by mouth daily. 01/10/20  Yes [provider]  escitalopram (LEXAPRO) 20 MG tablet Take 1 tablet (20 mg total) by mouth daily. Patient not taking: Reported on 01/28/2020 01/02/20 04/01/20  Georgina QuintSagardia, Miguel Jose, MD  HYDROcodone-acetaminophen [facility](NORCO) 5-325 MG tablet Take 1 tablet by mouth every 6 (six) hours as needed. Patient not taking: Reported on 01/28/2020 01/19/20   Georgina QuintSagardia, Miguel Jose, MD  rosuvastatin  (CRESTOR) 40 MG tablet Take 1 tablet (40 mg total) by mouth daily. Patient not taking: Reported on 01/28/2020 01/25/20 01/19/21  Little IshikawaSchumann, Christopher L, MD   Physical Exam: Vitals:   01/28/20 1430 01/28/20 1630 01/28/20 1800 01/28/20 1815  BP: (!) 171/97 (!) 155/92 (!) 181/106   Pulse: 61 (!) 58 61 61  Resp: 19 16  18   Temp: 97.8 F (36.6 C)     TempSrc: Oral     SpO2: 98% 97% 95% 99%   Constitutional: NAD, calm, comfortable Eyes: PERRL, lids and conjunctivae normal, exophthalmos bilaterally ENMT: Mucous membranes are moist. Posterior pharynx clear of any exudate or lesions.Normal dentition.  Neck: normal, supple, no masses, no thyromegaly Respiratory: clear to auscultation bilaterally, no wheezing, no crackles. Normal respiratory effort. No accessory muscle use.  Cardiovascular: Regular rate and rhythm, no murmurs / rubs / gallops. No extremity edema. 2+ pedal pulses. No carotid bruits.  Abdomen: no tenderness, no masses palpated. No hepatosplenomegaly. Bowel sounds positive.  Musculoskeletal: no clubbing / cyanosis. No joint deformity upper and lower extremities. Good ROM, no contractures. Normal muscle tone.  Skin: no rashes, lesions, ulcers. No induration Neurologic: CN 2-12 grossly intact. Sensation intact, DTR normal. Strength 5/5 in all 4.  Psychiatric: Normal judgment and insight. Alert and oriented x 3. Normal mood.   Labs on Admission: I have personally reviewed following labs and imaging studies  CBC: Recent Labs  Lab 01/28/20 1503 01/28/20 1645  WBC 6.5  --   NEUTROABS 5.1  --   HGB 10.9* 12.2  HCT 34.2* 36.0  MCV 90.2  --   PLT 265  --    Basic Metabolic Panel: Recent Labs  Lab 01/28/20 1503 01/28/20 1645  NA 123* 119*  K 3.4* 3.3*  CL 72*  --   CO2 37*  --   GLUCOSE 127*  --   BUN 11  --   CREATININE 0.73  --   CALCIUM 9.3  --    GFR: Estimated Creatinine Clearance: 65.2 mL/min (by C-G formula based on SCr of 0.73 mg/dL). Liver Function  Tests: Recent Labs  Lab 01/28/20 1503  AST 34  ALT 17  ALKPHOS 51  BILITOT 0.6  PROT 7.6  ALBUMIN 3.8   Urine analysis:    Component Value Date/Time   COLORURINE YELLOW 01/28/2020 1503   APPEARANCEUR CLEAR 01/28/2020 1503   LABSPEC 1.015 01/28/2020 1503   PHURINE 6.0 01/28/2020 1503   GLUCOSEU NEGATIVE 01/28/2020 1503   HGBUR NEGATIVE 01/28/2020 1503   BILIRUBINUR NEGATIVE 01/28/2020 1503   KETONESUR NEGATIVE 01/28/2020 1503   PROTEINUR 30 (A) 01/28/2020 1503   UROBILINOGEN 1.0 11/12/2008 1440   NITRITE NEGATIVE 01/28/2020 1503   LEUKOCYTESUR NEGATIVE 01/28/2020 1503   Radiological Exams on Admission: Personally reviewed and I agree with radiologist reading as below.  CT Head Wo Contrast  Result Date: 01/28/2020 CLINICAL DATA:  Transient ischemic attack, migraine headache EXAM: CT HEAD WITHOUT CONTRAST TECHNIQUE: Contiguous axial images were obtained from the base of the skull through the vertex without intravenous contrast. COMPARISON:  None. FINDINGS: Brain: The examination is slightly limited by motion artifact. Remote high right parietal infarct is noted. No evidence of acute intracranial hemorrhage or infarct. No abnormal mass effect or midline shift. No abnormal intra or extra-axial mass lesion or fluid collection. Ventricular size is normal. Cerebellum is unremarkable. Vascular: No asymmetric hyperdense vasculature at the skull base. Skull: Intact Sinuses/Orbits: The paranasal sinuses are clear. The ovaries are unremarkable. Other: Mastoid air cells and middle ear cavities are clear. IMPRESSION: Remote right parietal infarct. No evidence of acute intracranial hemorrhage or infarct. Electronically Signed   By: Helyn Numbers MD   On: 01/28/2020 18:57   DG Chest Port 1 View  Result Date: 01/28/2020 CLINICAL DATA:  Altered mental status EXAM: PORTABLE CHEST 1 VIEW COMPARISON:  Chest radiograph November 09, 2019 FINDINGS: Multiple monitoring leads overlie the patient. Stable cardiac  and mediastinal contours. Tortuosity of the thoracic aorta. No large area pulmonary consolidation. No pleural effusion or pneumothorax. Thoracic spine degenerative changes. IMPRESSION: No active disease. Electronically Signed   By: Annia Belt M.D.   On: 01/28/2020 16:22   EKG: Independently reviewed, showing sinus bradycardia with rate of 54  Assessment/Plan  Principal Problem:   Hyponatremia Active Problems:   Essential hypertension   Chronic respiratory failure with hypoxia [facility])   Kathleen Cameron is a 54 year old female with medical diagnosis of COPD on 2 L O2, hypertension, depression/anxiety, hld, recent shingles infection, presents with chief concern of lethargy and poor PO intake. She was found to have hyponatremia.   # Hyponatremia  # Hypochloremia  # Hypokalemia  - etiology unclear at this time and may be mulifactorial including poor PO intake in setting  of HCTZ use - NS + KCl 40 mEq/L IVF at 85 cc/hr - Goal increase in serum Na of 4-6 mEq/L in 24 hours - BMP q4h until Na level is at least 125 - Due to hyponatremia and difficult to arouse state, we will admit to progressive for close monitoring  - Holding home HCTZ   # Hypertension - elevated, Vasotec 1.25 mg IV once ordered, however not given due to patient's blood pressure improving to normal - Holding home HCTZ 25 mg daily and bisoprolol 10 mg daily - Resumed home amlodipine 5 mg daily  # Sinus bradycardia - holding bisoprolol  # Bilateral lower extremity edema to mid leg - Patient is on amlodipine 5 mg - Reviewed CXR and low clinical suspicion of heart failure exacerbation at this time - Echo 04/14/19 showed EF 60-65 %, G1DD,   # Chronic, hypercarbic respiratory failure - secondary to COPD # COPD - does not appear to be in exacerbation - Continue 2 L [place] - Resumed home budeson-glycopyrrol-formoterol 2 puffs BID, duoneb q6h prn, and albuterol prn q6h for wheezing and/or shortness of breath  - Low clinical suspicion for  infection - no abx at this time  # Excessive sleepiness - Patient is arousable with deep sternal rub  - Holding home alprazolam and norco5 - NPO except for sips with meds  - Aspirations and fall precautions  # Recent shingles infection involving the left face and eyelids - resumed valacylovir 1000 mg daily and prednisolone ophthalmic suspension, 1 drop QID  # HLD - resumed home rosuvastatin 20 mg qhs   # Depression/anxiety - resumed home escitalopram 10 mg daily  DVT prophylaxis: enoxaparin 40 mg subcutaneous daily Code Status: full Diet: NPO Family Communication: discussed with spouse Disposition Plan: pending clinical course Consults called: not indicated at this time Admission status: inpatient  Ica Daye N Shamia Uppal D.O. [facility]  If 7AM-7PM, please contact day-coverage provider [email or web address]  01/28/2020, 8:40 PM

## 2020-01-28 NOTE — ED Notes (Signed)
Pt is awake at this time and said hello to this RN. Pt asked if she is doing alright and she said "yeah" and began chuckling. Pt asking for the "hot dog" because she has to go. Will update provider.

## 2020-01-28 NOTE — ED Provider Notes (Addendum)
[facility] EMERGENCY DEPARTMENT Provider Note   CSN: [id number] Arrival date & time: 01/28/20  1423     History Chief Complaint  Patient presents with  . Altered Mental Status    Kathleen Cameron is a 54 y.o. female history of COPD, hypertension, who presented with altered mental status, and shortness of breath.  Patient has been having shortness of breath for the last week or so.  She states that it is worse with exertion.  She is on 2 L all the time and still gets very short of breath.  Patient also is more confused than usual.  Patient states that she has been seeing little babies but she knows they are not real.  Patient denies any drug use.  Patient states that she been compliant with her medicines.  She noticed bilateral lower extremity edema that comes and goes.  The history is provided by the patient.       Past Medical History:  Diagnosis Date  . Asthma   . COPD (chronic obstructive pulmonary disease) (HCC)   . Hypertension     Patient Active Problem List   Diagnosis Date Noted  . Anxiety 12/02/2019  . COPD exacerbation (HCC) 11/06/2019  . Depression 11/06/2019  . Acute on chronic respiratory failure with hypoxia and hypercapnia (HCC) 11/06/2019  . Respiratory failure (HCC) 11/06/2019  . Current moderate episode of major depressive disorder without prior episode (HCC) 09/20/2019  . Pedal edema 08/04/2019  . Chronic respiratory failure with hypoxia (HCC) 03/07/2019  . CKD (chronic kidney disease) 01/05/2019  . COPD (chronic obstructive pulmonary disease) (HCC) 09/29/2018  . Pulmonary nodules 09/27/2018  . Dyslipidemia 07/07/2018  . Essential hypertension 03/10/2018    History reviewed. No pertinent surgical history.   OB History   No obstetric history on file.     Family History  Family history unknown: Yes    Social History   Tobacco Use  . Smoking status: Never Smoker  . Smokeless tobacco: Never Used  Substance Use Topics    . Alcohol use: Yes    Alcohol/week: 2.0 standard drinks    Types: 2 Cans of beer per week  . Drug use: Never    Home Medications Prior to Admission medications   Medication Sig Start Date End Date Taking? Authorizing Provider  acetaminophen (TYLENOL) 500 MG tablet Take 500 mg by mouth daily as needed for mild pain or headache.   Yes [provider]  albuterol (VENTOLIN HFA) 108 (90 Base) MCG/ACT inhaler Inhale into the lungs every 6 (six) hours as needed for wheezing or shortness of breath.   Yes [provider]  ALPRAZolam (XANAX) 0.25 MG tablet Take 1 tablet (0.25 mg total) by mouth 3 (three) times daily as needed for anxiety. Patient taking differently: Take 0.25 mg by mouth 3 (three) times daily as needed for anxiety (shingles pain).  01/02/20  Yes Sagardia, Eilleen Kempf, MD  alum & mag hydroxide-simeth (MAALOX/MYLANTA) 200-200-20 MG/5ML suspension Take 15 mLs by mouth every 6 (six) hours as needed for indigestion or heartburn. 11/12/19  Yes Shah, Pratik D, DO  amLODipine (NORVASC) 5 MG tablet Take 1 tablet (5 mg total) by mouth daily. 08/09/19 02/19/20 Yes SagardiaEilleen Kempf, MD  aspirin EC 81 MG EC tablet Take 1 tablet (81 mg total) by mouth daily. 04/17/19  Yes [name], MD  bisoprolol (ZEBETA) 10 MG tablet Take 1 tablet (10 mg total) by mouth daily. 10/20/19  Yes Sagardia, Eilleen Kempf, MD  Budeson-Glycopyrrol-Formoterol (BREZTRI AEROSPHERE)  160-9-4.8 MCG/ACT AERO Inhale 2 puffs into the lungs in the morning and at bedtime. Patient taking differently: Inhale 1 puff into the lungs See admin instructions. Inhale 1 puff into the lungs when walking to bathroom - 6-7 times daily 12/02/19  Yes [name], NP  Dextromethorphan-guaiFENesin [facility] DM MAXIMUM STRENGTH) 60-1200 MG TB12 Take 1 tablet by mouth 2 (two) times daily as needed (congestion).   Yes [provider]  escitalopram (LEXAPRO) 10 MG tablet Take 10 mg by mouth daily. 01/02/20  Yes [provider]  famotidine (PEPCID) 10 MG tablet Take 10 mg by mouth 2 (two) times daily as needed for heartburn or indigestion (acid reflux).   Yes [provider]  hydrochlorothiazide (HYDRODIURIL) 25 MG tablet Take 25 mg by mouth daily. 09/07/19  Yes [provider]  ipratropium-albuterol (DUONEB) 0.5-2.5 (3) MG/3ML SOLN Take 3 mLs by nebulization every 6 (six) hours as needed. Patient taking differently: Take 3 mLs by nebulization every 6 (six) hours as needed (Shortness of Breath).  05/03/19  Yes [name], NP  nitroGLYCERIN (NITROSTAT) 0.4 MG SL tablet PLACE 1 TABLET(0.4 MG TOTAL) UNDER THE TONGUE EVERY 5 MINUTES AS NEEDED FOR CHEST PAIN Patient taking differently: Place 0.4 mg under the tongue every 5 (five) minutes as needed for chest pain.  01/10/20  Yes SagardiaEilleen Kempf, MD  OXYGEN Inhale 2 L into the lungs continuous.   Yes [provider]  pantoprazole (PROTONIX) 40 MG tablet Take 1 tablet (40 mg total) by mouth daily. 10/27/19  Yes Sagardia, Eilleen Kempf, MD  polyethylene glycol (MIRALAX / GLYCOLAX) 17 g packet Take 17 g by mouth daily as needed (constipation).   Yes [provider]  prednisoLONE acetate (PRED FORTE) 1 % ophthalmic suspension Place 1 drop into the left eye 4 (four) times daily. 01/10/20  Yes [provider]  rosuvastatin (CRESTOR) 20 MG tablet Take 20 mg by mouth daily.   Yes [provider]  valACYclovir (VALTREX) 1000 MG tablet Take 1,000 mg by mouth daily. 01/10/20  Yes [provider]  escitalopram (LEXAPRO) 20 MG tablet Take 1 tablet (20 mg total) by mouth daily. Patient not taking: Reported on 01/28/2020 01/02/20 04/01/20  [name], MD  HYDROcodone-acetaminophen [facility]) 5-325 MG tablet Take 1 tablet by mouth every 6 (six) hours as needed. Patient not taking: Reported on 01/28/2020 01/19/20   [name], MD  rosuvastatin (CRESTOR) 40 MG tablet Take 1 tablet (40 mg total) by  mouth daily. Patient not taking: Reported on 01/28/2020 01/25/20 01/19/21  [name], MD    Allergies    Stiolto respimat [tiotropium bromide-olodaterol]  Review of Systems   Review of Systems  Psychiatric/Behavioral: Positive for confusion.  All other systems reviewed and are negative.   Physical Exam Updated Vital Signs BP (!) 155/92   Pulse (!) 58   Temp 97.8 F (36.6 C) (Oral)   Resp 16   SpO2 97%   Physical Exam Vitals and nursing note reviewed.  Constitutional:      Comments: Confused and sleepy but arousable  HENT:     Head: Normocephalic.     Nose: Nose normal.     Mouth/Throat:     Mouth: Mucous membranes are moist.  Eyes:     Extraocular Movements: Extraocular movements intact.     Pupils: Pupils are equal, round, and reactive to light.  Cardiovascular:     Rate and Rhythm: Normal rate and regular rhythm.  Pulses: Normal pulses.     Heart sounds: Normal heart sounds.  Pulmonary:     Comments: Crackles bilateral bases, minimal wheezing throughout Abdominal:     General: Abdomen is flat.     Palpations: Abdomen is soft.  Musculoskeletal:        General: Normal range of motion.     Cervical back: Normal range of motion.     Comments: 1+ edema bilaterally  Skin:    General: Skin is warm.     Capillary Refill: Capillary refill takes less than 2 seconds.  Neurological:     Comments: Confused and ANO x2, normal strength and sensation bilateral arms and legs.  Psychiatric:        Mood and Affect: Mood normal.     ED Results / Procedures / Treatments   Labs (all labs ordered are listed, but only abnormal results are displayed) Labs Reviewed  COMPREHENSIVE METABOLIC PANEL - Abnormal; Notable for the following components:      Result Value   Sodium 123 (*)    Potassium 3.4 (*)    Chloride 72 (*)    CO2 37 (*)    Glucose, Bld 127 (*)    All other components within normal limits  CBC WITH DIFFERENTIAL/PLATELET - Abnormal; Notable for the  following components:   RBC 3.79 (*)    Hemoglobin 10.9 (*)    HCT 34.2 (*)    All other components within normal limits  URINALYSIS, ROUTINE W REFLEX MICROSCOPIC - Abnormal; Notable for the following components:   Protein, ur 30 (*)    All other components within normal limits  I-STAT ARTERIAL BLOOD GAS, ED - Abnormal; Notable for the following components:   pCO2 arterial 90.1 (*)    pO2, Arterial 67 (*)    Bicarbonate 53.1 (*)    TCO2 >50 (*)    Acid-Base Excess 23.0 (*)    Sodium 119 (*)    Potassium 3.3 (*)    All other components within normal limits  RESPIRATORY PANEL BY RT PCR (FLU A&B, COVID)  ETHANOL  RAPID URINE DRUG SCREEN, HOSP PERFORMED  OSMOLALITY  BRAIN NATRIURETIC PEPTIDE  OSMOLALITY, URINE  SODIUM, URINE, RANDOM  CBG MONITORING, ED  TROPONIN I (HIGH SENSITIVITY)    EKG EKG Interpretation  Date/Time:  Saturday January 28 2020 14:27:07 EDT Ventricular Rate:  57 PR Interval:    QRS Duration: 92 QT Interval:  475 QTC Calculation: 463 R Axis:   -47 Text Interpretation: Sinus rhythm Left anterior fascicular block Anteroseptal infarct, old Abnormal T, consider ischemia, lateral leads ST elevation, consider inferior injury poor baseline. grossly unchanged since previous Confirmed by Richardean CanalYao, Mekhai Venuto H ([id number]) on 01/28/2020 2:58:16 PM   Radiology DG Chest Port 1 View  Result Date: 01/28/2020 CLINICAL DATA:  Altered mental status EXAM: PORTABLE CHEST 1 VIEW COMPARISON:  Chest radiograph November 09, 2019 FINDINGS: Multiple monitoring leads overlie the patient. Stable cardiac and mediastinal contours. Tortuosity of the thoracic aorta. No large area pulmonary consolidation. No pleural effusion or pneumothorax. Thoracic spine degenerative changes. IMPRESSION: No active disease. Electronically Signed   By: Annia Beltrew  Davis M.D.   On: 01/28/2020 16:22    Procedures Procedures (including critical care time)  CRITICAL CARE Performed by: Richardean Canalavid H Layken Beg   Total critical care time:30  minutes  Critical care time was exclusive of separately billable procedures and treating other patients.  Critical care was necessary to treat or prevent imminent or life-threatening deterioration.  Critical care was time spent personally by  me on the following activities: development of treatment plan with patient and/or surrogate as well as nursing, discussions with consultants, evaluation of patient's response to treatment, examination of patient, obtaining history from patient or surrogate, ordering and performing treatments and interventions, ordering and review of laboratory studies, ordering and review of radiographic studies, pulse oximetry and re-evaluation of patient's condition.    Medications Ordered in ED Medications - No data to display  ED Course  I have reviewed the triage vital signs and the nursing notes.  Pertinent labs & imaging results that were available during my care of the patient were reviewed by me and considered in my medical decision making (see chart for details).    MDM Rules/Calculators/A&P                         Destinie Thornsberry is a 54 y.o. female history of COPD here presenting with confusion and worsening shortness of breath. Worsening shortness of breath likely secondary to either COPD versus CHF.  Confusion could be multifactorial such as pneumonia or UTI or electrolyte abnormalities or brain mass.  Plan to get CT head and chest x-ray and urinalysis and CBC and CMP and troponin and BNP.   7:09 PM Sodium is 123.  Patient's pH is 7.37 and CO2 is 90 so she is compensated.  Albuterol and Solu-Medrol.  Patient CT head did not show a mass.  I think her hyponatremia likely secondary to hydrochlorothiazide.  Will fluid restrict patient.  Will admit for hyponatremia, COPD exacerbation. COVID negative.    Final Clinical Impression(s) / ED Diagnoses Final diagnoses:  None    Rx / DC Orders ED Discharge Orders    None       Charlynne Pander,  MD 01/28/20 1910    Charlynne Pander, MD 02/01/20 [phone]

## 2020-01-28 NOTE — ED Notes (Signed)
EMT and this RN attempted to arouse pt to alert her of movement rom ED to floor and pt not responding. Pt appears to be salivating a lot more and is very stiff and holding arms close to her. Pt VSS at this time. EMT has gone to find admitting provider at this time for bedside assessment.

## 2020-01-29 DIAGNOSIS — G9341 Metabolic encephalopathy: Secondary | ICD-10-CM

## 2020-01-29 DIAGNOSIS — E871 Hypo-osmolality and hyponatremia: Secondary | ICD-10-CM | POA: Diagnosis not present

## 2020-01-29 DIAGNOSIS — J9611 Chronic respiratory failure with hypoxia: Secondary | ICD-10-CM

## 2020-01-29 DIAGNOSIS — I10 Essential (primary) hypertension: Secondary | ICD-10-CM | POA: Diagnosis not present

## 2020-01-29 LAB — BASIC METABOLIC PANEL
Anion gap: 13 (ref 5–15)
BUN: 11 mg/dL (ref 6–20)
CO2: 39 mmol/L — ABNORMAL HIGH (ref 22–32)
Calcium: 9.2 mg/dL (ref 8.9–10.3)
Chloride: 72 mmol/L — ABNORMAL LOW (ref 98–111)
Creatinine, Ser: 0.72 mg/dL (ref 0.44–1.00)
GFR, Estimated: >60 mL/min (ref >60)
Glucose, Bld: 96 mg/dL (ref 70–99)
Potassium: 4.5 mmol/L (ref 3.5–5.1)
Sodium: 124 mmol/L — ABNORMAL LOW (ref 135–145)

## 2020-01-29 LAB — CBC
HCT: 37.4 % (ref 36.0–46.0)
Hemoglobin: 12.2 g/dL (ref 12.0–15.0)
MCH: 28.7 pg (ref 26.0–34.0)
MCHC: 32.6 g/dL (ref 30.0–36.0)
MCV: 88 fL (ref 80.0–100.0)
Platelets: 289 10*3/uL (ref 150–400)
RBC: 4.25 MIL/uL (ref 3.87–5.11)
RDW: 13.2 % (ref 11.5–15.5)
WBC: 6.5 10*3/uL (ref 4.0–10.5)
nRBC: 0 % (ref 0.0–0.2)

## 2020-01-29 LAB — BASIC METABOLIC PANEL WITH GFR
Anion gap: 10 (ref 5–15)
BUN: 11 mg/dL (ref 6–20)
CO2: 38 mmol/L — ABNORMAL HIGH (ref 22–32)
Calcium: 9.3 mg/dL (ref 8.9–10.3)
Chloride: 77 mmol/L — ABNORMAL LOW (ref 98–111)
Creatinine, Ser: 0.65 mg/dL (ref 0.44–1.00)
GFR, Estimated: >60 mL/min
Glucose, Bld: 120 mg/dL — ABNORMAL HIGH (ref 70–99)
Potassium: 4.2 mmol/L (ref 3.5–5.1)
Sodium: 125 mmol/L — ABNORMAL LOW (ref 135–145)

## 2020-01-29 MED ORDER — UMECLIDINIUM BROMIDE 62.5 MCG/INH IN AEPB
1.0000 | INHALATION_SPRAY | Freq: Every day | RESPIRATORY_TRACT | Status: DC
  Administered 2020-01-30 – 2020-01-31 (×2): 1 via RESPIRATORY_TRACT
  Filled 2020-01-29: qty 7

## 2020-01-29 MED ORDER — FLUTICASONE FUROATE-VILANTEROL 100-25 MCG/INH IN AEPB
1.0000 | INHALATION_SPRAY | Freq: Every day | RESPIRATORY_TRACT | Status: DC
  Administered 2020-01-30 – 2020-01-31 (×2): 1 via RESPIRATORY_TRACT
  Filled 2020-01-29: qty 28

## 2020-01-29 MED ORDER — PREDNISOLONE ACETATE 1 % OP SUSP
1.0000 [drp] | Freq: Three times a day (TID) | OPHTHALMIC | Status: DC
  Administered 2020-01-29 – 2020-02-02 (×17): 1 [drp] via OPHTHALMIC
  Filled 2020-01-29: qty 5

## 2020-01-29 MED ORDER — ACETAMINOPHEN 325 MG PO TABS
650.0000 mg | ORAL_TABLET | Freq: Four times a day (QID) | ORAL | Status: DC | PRN
  Administered 2020-02-01: 650 mg via ORAL
  Filled 2020-01-29: qty 2

## 2020-01-29 MED ORDER — HYDRALAZINE HCL 50 MG PO TABS: 50.0000 mg | ORAL_TABLET | Freq: Three times a day (TID) | ORAL | Status: DC | PRN

## 2020-01-29 MED ORDER — AMLODIPINE BESYLATE 5 MG PO TABS: 5.0000 mg | ORAL_TABLET | Freq: Every day | ORAL | Status: DC

## 2020-01-29 MED ORDER — BISOPROLOL FUMARATE 5 MG PO TABS: 10.0000 mg | ORAL_TABLET | Freq: Every day | ORAL | Status: DC

## 2020-01-29 MED ORDER — AMLODIPINE BESYLATE 10 MG PO TABS
10.0000 mg | ORAL_TABLET | Freq: Every day | ORAL | Status: DC
  Administered 2020-01-29 – 2020-02-02 (×5): 10 mg via ORAL
  Filled 2020-01-29 (×5): qty 1

## 2020-01-29 NOTE — ED Notes (Signed)
Attempted report x1. 

## 2020-01-29 NOTE — ED Notes (Signed)
Unable to obtain blood work for [employer]. Attempted x2, will alert lab.

## 2020-01-29 NOTE — Progress Notes (Signed)
[facility] PROGRESS NOTE    Progress Note  Kathleen Cameron  BDZ:[id number] DOB: 01-Oct-1965 DOA: 01/28/2020 PCP: [name], MD     Brief Narrative:   Kathleen Cameron is an 54 y.o. female past medical history significant for essential hypertension COPD comes EMS for chief complaint of excessive sleepiness and poor oral intake per husband he called EMS she had been excessively sleeping and with poor coordination and poor oral intake for the last 7 days prior to admission.  The head was negative in the ED showed old infarcts chest x-ray showed no acute findings.  Assessment/Plan:   Acute metabolic encephalopathy secondary to hypovolemic hyponatremia: She appears hypovolemic on physical exam Etiology may be multifactorial due to poor oral intake in the setting of hydrochlorothiazide use and Lexapro. She was started on normal saline with potassium supplement and her sodium has improved from 1 19-1 24. We will try not to increase sodium more than 10 mg in a 24-hour.  We will continue normal saline at a slower rate. Agree with holding hydrochlorothiazide.  Essential hypertension: Hydrochlorothiazide, beta-blocker and ACE inhibitor were held. Norvasc was continued her blood pressure significantly elevated this morning we will restart her beta-blocker.  Sinus bradycardia: Now improved resume her bisoprolol.  Asymmetric lower extremity edema: Chest x-ray shows no volume overloaded. Check lower extremity Doppler to rule out a DVT.  Chronic respiratory failure with hypercarbia secondary to COPD: Appears to be compensated continue her 2 L of oxygen she wears at home. Continue inhalers.  Recent shingle infections of the left eye and eyelid: Resume valacyclovir continue steroids ophthalmic suspension.  Hyperlipidemia: Continue statins.  DVT prophylaxis: lovenox Family Communication:none Status is: Inpatient  Remains inpatient appropriate  because:Hemodynamically unstable   Dispo: The patient is from: Home              Anticipated d/c is to: Home              Anticipated d/c date is: 3 days              Patient currently is not medically stable to d/c.        Code Status:     Code Status Orders  (From admission, onward)         Start     Ordered   01/28/20 2040  Full code  Continuous        01/28/20 2040        Code Status History    Date Active Date Inactive Code Status Order ID Comments User Context   11/06/2019 0022 11/12/2019 1838 Full Code [id number]  Anselm Jungling, DO ED   07/24/2019 0220 07/24/2019 2230 Full Code [id number]  Hillary Bow, DO ED   04/13/2019 1237 04/17/2019 1554 Full Code [id number]  Ollen Bowl, MD ED   12/20/2018 0356 12/22/2018 2018 Full Code [id number]  Eduard Clos, MD ED   Advance Care Planning Activity        IV Access:    Peripheral IV   Procedures and diagnostic studies:   CT Head Wo Contrast  Result Date: 01/28/2020 CLINICAL DATA:  Transient ischemic attack, migraine headache EXAM: CT HEAD WITHOUT CONTRAST TECHNIQUE: Contiguous axial images were obtained from the base of the skull through the vertex without intravenous contrast. COMPARISON:  None. FINDINGS: Brain: The examination is slightly limited by motion artifact. Remote high right parietal infarct is noted. No evidence of acute intracranial hemorrhage or infarct. No abnormal mass effect or midline shift. No  abnormal intra or extra-axial mass lesion or fluid collection. Ventricular size is normal. Cerebellum is unremarkable. Vascular: No asymmetric hyperdense vasculature at the skull base. Skull: Intact Sinuses/Orbits: The paranasal sinuses are clear. The ovaries are unremarkable. Other: Mastoid air cells and middle ear cavities are clear. IMPRESSION: Remote right parietal infarct. No evidence of acute intracranial hemorrhage or infarct. Electronically Signed   By: Helyn NumbersAshesh  Parikh MD   On: 01/28/2020 18:57   DG  Chest Port 1 View  Result Date: 01/28/2020 CLINICAL DATA:  Altered mental status EXAM: PORTABLE CHEST 1 VIEW COMPARISON:  Chest radiograph November 09, 2019 FINDINGS: Multiple monitoring leads overlie the patient. Stable cardiac and mediastinal contours. Tortuosity of the thoracic aorta. No large area pulmonary consolidation. No pleural effusion or pneumothorax. Thoracic spine degenerative changes. IMPRESSION: No active disease. Electronically Signed   By: Annia Beltrew  Davis M.D.   On: 01/28/2020 16:22     Medical Consultants:    None.  Anti-Infectives:   none  Subjective:    Kathleen Cameron she is more awake this morning she relates she is hungry.  Objective:    Vitals:   01/29/20 0430 01/29/20 0500 01/29/20 0519 01/29/20 0700  BP: 121/67 (!) 192/95  (!) 160/103  Pulse: (!) 53 (!) 57  (!) 56  Resp: 18 20  18   Temp:  98.3 F (36.8 C)  98.2 F (36.8 C)  TempSrc:  Oral  Oral  SpO2: 100% 100%  100%  Weight:   50 kg   Height:  5' (1.524 m) 5\' 4"  (1.626 m)    SpO2: 100 % O2 Flow Rate (L/min): 2 L/min   Intake/Output Summary (Last 24 hours) at 01/29/2020 0849 Last data filed at 01/29/2020 0520 Gross per 24 hour  Intake 719.63 ml  Output 150 ml  Net 569.63 ml   Filed Weights   01/29/20 0519  Weight: 50 kg    Exam: General exam: In no acute distress. Respiratory system: Good air movement and clear to auscultation. Cardiovascular system: S1 & S2 heard, RRR. No JVD. Gastrointestinal system: Abdomen is nondistended, soft and nontender.  Extremities: The left lower extremity is more swollen than the right. Skin: No rashes, lesions or ulcers Psychiatry: Judgement and insight appear normal. Mood & affect appropriate.    Data Reviewed:    Labs: Basic Metabolic Panel: Recent Labs  Lab 01/28/20 1503 01/28/20 1503 01/28/20 1645 01/28/20 1645 01/28/20 2126 01/29/20 0607  NA 123*  --  119*  --  122* 124*  K 3.4*   < > 3.3*   < > 3.8 4.5  CL 72*  --   --   --  70* 72*   CO2 37*  --   --   --  41* 39*  GLUCOSE 127*  --   --   --  101* 96  BUN 11  --   --   --  9 11  CREATININE 0.73  --   --   --  0.73 0.72  CALCIUM 9.3  --   --   --  9.4 9.2  MG  --   --   --   --  1.5*  --    < > = values in this interval not displayed.   GFR Estimated Creatinine Clearance: 62 mL/min (by C-G formula based on SCr of 0.72 mg/dL). Liver Function Tests: Recent Labs  Lab 01/28/20 1503  AST 34  ALT 17  ALKPHOS 51  BILITOT 0.6  PROT 7.6  ALBUMIN 3.8  No results for input(s): LIPASE, AMYLASE in the last 168 hours. No results for input(s): AMMONIA in the last 168 hours. Coagulation profile No results for input(s): INR, PROTIME in the last 168 hours. COVID-19 Labs  No results for input(s): DDIMER, FERRITIN, LDH, CRP in the last 72 hours.  Lab Results  Component Value Date   SARSCOV2NAA NEGATIVE 01/28/2020   SARSCOV2NAA NEGATIVE 11/05/2019   SARSCOV2NAA NEGATIVE 11/02/2019   SARSCOV2NAA NEGATIVE 07/24/2019    CBC: Recent Labs  Lab 01/28/20 1503 01/28/20 1645 01/29/20 0607  WBC 6.5  --  6.5  NEUTROABS 5.1  --   --   HGB 10.9* 12.2 12.2  HCT 34.2* 36.0 37.4  MCV 90.2  --  88.0  PLT 265  --  289   Cardiac Enzymes: No results for input(s): CKTOTAL, CKMB, CKMBINDEX, TROPONINI in the last 168 hours. BNP (last 3 results) No results for input(s): PROBNP in the last 8760 hours. CBG: No results for input(s): GLUCAP in the last 168 hours. D-Dimer: No results for input(s): DDIMER in the last 72 hours. Hgb A1c: No results for input(s): HGBA1C in the last 72 hours. Lipid Profile: No results for input(s): CHOL, HDL, LDLCALC, TRIG, CHOLHDL, LDLDIRECT in the last 72 hours. Thyroid function studies: Recent Labs    01/28/20 2125-08-24  TSH 27.195*   Anemia work up: No results for input(s): VITAMINB12, FOLATE, FERRITIN, TIBC, IRON, RETICCTPCT in the last 72 hours. Sepsis Labs: Recent Labs  Lab 01/28/20 1503 01/29/20 0607  WBC 6.5 6.5   Microbiology Recent  Results (from the past 240 hour(s))  Respiratory Panel by RT PCR (Flu A&B, Covid) -     Status: None   Collection Time: 01/28/20  3:13 PM   Specimen: Nasopharyngeal  Result Value Ref Range Status   SARS Coronavirus 2 by RT PCR NEGATIVE NEGATIVE Final    Comment: (NOTE) SARS-CoV-2 target nucleic acids are NOT DETECTED.  The SARS-CoV-2 RNA is generally detectable in upper respiratoy specimens during the acute phase of infection. The lowest concentration of SARS-CoV-2 viral copies this assay can detect is 131 copies/mL. A negative result does not preclude SARS-Cov-2 infection and should not be used as the sole basis for treatment or other patient management decisions. A negative result may occur with  improper specimen collection/handling, submission of specimen other than nasopharyngeal swab, presence of viral mutation(s) within the areas targeted by this assay, and inadequate number of viral copies (<131 copies/mL). A negative result must be combined with clinical observations, patient history, and epidemiological information. The expected result is Negative.  Fact Sheet for Patients:  https://www.moore.com/  Fact Sheet for Healthcare Providers:  https://www.young.biz/  This test is no t yet approved or cleared by the [place] FDA and  has been authorized for detection and/or diagnosis of SARS-CoV-2 by FDA under an Emergency Use Authorization (EUA). This EUA will remain  in effect (meaning this test can be used) for the duration of the COVID-19 declaration under Section 564(b)(1) of the Act, 21 U.S.C. section 360bbb-3(b)(1), unless the authorization is terminated or revoked sooner.     Influenza A by PCR NEGATIVE NEGATIVE Final   Influenza B by PCR NEGATIVE NEGATIVE Final    Comment: (NOTE) The Xpert Xpress SARS-CoV-2/FLU/RSV assay is intended as an aid in  the diagnosis of influenza from Nasopharyngeal swab specimens and  should not  be used as a sole basis for treatment. Nasal washings and  aspirates are unacceptable for Xpert Xpress SARS-CoV-2/FLU/RSV  testing.  Fact Sheet for  Patients: https://www.moore.com/  Fact Sheet for Healthcare Providers: https://www.young.biz/  This test is not yet approved or cleared by the [place] FDA and  has been authorized for detection and/or diagnosis of SARS-CoV-2 by  FDA under an Emergency Use Authorization (EUA). This EUA will remain  in effect (meaning this test can be used) for the duration of the  Covid-19 declaration under Section 564(b)(1) of the Act, 21  U.S.C. section 360bbb-3(b)(1), unless the authorization is  terminated or revoked. Performed at [facility], [address] [place], [place] [address]      Medications:   . amLODipine  5 mg Oral Daily  . aspirin EC  81 mg Oral Daily  . bisoprolol  10 mg Oral Daily  . enoxaparin (LOVENOX) injection  40 mg Subcutaneous Q24H  . escitalopram  10 mg Oral Daily  . fluticasone furoate-vilanterol  1 puff Inhalation Daily   And  . umeclidinium bromide  1 puff Inhalation Daily  . prednisoLONE acetate  1 drop Left Eye TID AC & HS  . rosuvastatin  20 mg Oral Daily  . valACYclovir  1,000 mg Oral Daily   Continuous Infusions: . 0.9 % NaCl with KCl 40 mEq / L 85 mL/hr at 01/29/20 0849      LOS: 1 day   Marinda Elk  [facility]  01/29/2020, 8:49 AM

## 2020-01-30 ENCOUNTER — Inpatient Hospital Stay (HOSPITAL_COMMUNITY): Payer: PRIVATE HEALTH INSURANCE

## 2020-01-30 DIAGNOSIS — M7989 Other specified soft tissue disorders: Secondary | ICD-10-CM | POA: Diagnosis not present

## 2020-01-30 DIAGNOSIS — U071 COVID-19: Secondary | ICD-10-CM | POA: Diagnosis not present

## 2020-01-30 DIAGNOSIS — E871 Hypo-osmolality and hyponatremia: Secondary | ICD-10-CM | POA: Diagnosis not present

## 2020-01-30 DIAGNOSIS — R609 Edema, unspecified: Secondary | ICD-10-CM

## 2020-01-30 DIAGNOSIS — J9611 Chronic respiratory failure with hypoxia: Secondary | ICD-10-CM | POA: Diagnosis not present

## 2020-01-30 DIAGNOSIS — I10 Essential (primary) hypertension: Secondary | ICD-10-CM | POA: Diagnosis not present

## 2020-01-30 DIAGNOSIS — G9341 Metabolic encephalopathy: Secondary | ICD-10-CM | POA: Diagnosis not present

## 2020-01-30 LAB — BASIC METABOLIC PANEL
Anion gap: 10 (ref 5–15)
Anion gap: 12 (ref 5–15)
Anion gap: 10 (ref 5–15)
BUN: 16 mg/dL (ref 6–20)
BUN: 17 mg/dL (ref 6–20)
BUN: 21 mg/dL — ABNORMAL HIGH (ref 6–20)
CO2: 36 mmol/L — ABNORMAL HIGH (ref 22–32)
CO2: 35 mmol/L — ABNORMAL HIGH (ref 22–32)
CO2: 35 mmol/L — ABNORMAL HIGH (ref 22–32)
Calcium: 9.3 mg/dL (ref 8.9–10.3)
Calcium: 9 mg/dL (ref 8.9–10.3)
Calcium: 8.8 mg/dL — ABNORMAL LOW (ref 8.9–10.3)
Chloride: 80 mmol/L — ABNORMAL LOW (ref 98–111)
Chloride: 80 mmol/L — ABNORMAL LOW (ref 98–111)
Chloride: 83 mmol/L — ABNORMAL LOW (ref 98–111)
Creatinine, Ser: 0.87 mg/dL (ref 0.44–1.00)
Creatinine, Ser: 0.76 mg/dL (ref 0.44–1.00)
Creatinine, Ser: 0.7 mg/dL (ref 0.44–1.00)
GFR, Estimated: >60 mL/min (ref >60)
GFR, Estimated: >60 mL/min (ref >60)
GFR, Estimated: >60 mL/min (ref >60)
Glucose, Bld: 81 mg/dL (ref 70–99)
Glucose, Bld: 77 mg/dL (ref 70–99)
Glucose, Bld: 74 mg/dL (ref 70–99)
Potassium: 4.7 mmol/L (ref 3.5–5.1)
Potassium: 4.5 mmol/L (ref 3.5–5.1)
Potassium: 5.2 mmol/L — ABNORMAL HIGH (ref 3.5–5.1)
Sodium: 126 mmol/L — ABNORMAL LOW (ref 135–145)
Sodium: 127 mmol/L — ABNORMAL LOW (ref 135–145)
Sodium: 128 mmol/L — ABNORMAL LOW (ref 135–145)

## 2020-01-30 MED ORDER — SODIUM CHLORIDE 0.9 % IV SOLN: INTRAVENOUS | Status: AC

## 2020-01-30 NOTE — Progress Notes (Signed)
[facility] PROGRESS NOTE    Progress Note  Kathleen Cameron  QPR:[id number] DOB: 07-Jun-1965 DOA: 01/28/2020 PCP: [name], MD     Brief Narrative:   Kathleen Cameron is an 54 y.o. female past medical history significant for essential hypertension COPD comes EMS for chief complaint of excessive sleepiness and poor oral intake per husband he called EMS she had been excessively sleeping and with poor coordination and poor oral intake for the last 7 days prior to admission.  The head was negative in the ED showed old infarcts chest x-ray showed no acute findings.  Assessment/Plan:   Acute metabolic encephalopathy secondary to hypovolemic hyponatremia: She appears hypovolemic on physical exam. Check orthostatics and please document in chart, she relates she still dizzy upon standing.  Etiology likely multifactorial due to poor oral intake in the setting of hydrochlorothiazide and Lexapro use. We will transition IV fluids to normal saline without potassium and continue them for 24 hours continue to follow PMH every 12 hours. We will try not to increase sodium more than 10 mg in a 24-hour.  We will continue normal saline at a slower rate. Continue to hold diuretic therapy (hydrochlorothiazide) and Lexapro.  Essential hypertension: Continue to hold hydrochlorothiazide, she was restarted on her beta-blocker and Norvasc. Her blood pressure is improved today. Continue to hold ACE inhibitor goal blood pressure less than 180/100.  Sinus bradycardia: Now improved resume her bisoprolol.  Asymmetric lower extremity edema: Chest x-ray shows no volume overloaded. Lower extremity Dopplers pending.  Chronic respiratory failure with hypercarbia secondary to COPD: Appears to be compensated continue her 2 L of oxygen she wears at home. Continue inhalers.  Recent shingle infections of the left eye and eyelid: Resume valacyclovir continue steroids ophthalmic  suspension.  Hyperlipidemia: Continue statins.  DVT prophylaxis: lovenox Family Communication:none Status is: Inpatient  Remains inpatient appropriate because:Hemodynamically unstable   Dispo: The patient is from: Home              Anticipated d/c is to: Home              Anticipated d/c date is: 3 days              Patient currently is not medically stable to d/c.        Code Status:     Code Status Orders  (From admission, onward)         Start     Ordered   01/28/20 2040  Full code  Continuous        01/28/20 2040        Code Status History    Date Active Date Inactive Code Status Order ID Comments User Context   11/06/2019 0022 11/12/2019 1838 Full Code [id number]  Anselm Jungling, DO ED   07/24/2019 0220 07/24/2019 2230 Full Code [id number]  Hillary Bow, DO ED   04/13/2019 1237 04/17/2019 1554 Full Code [id number]  Ollen Bowl, MD ED   12/20/2018 0356 12/22/2018 2018 Full Code [id number]  Eduard Clos, MD ED   Advance Care Planning Activity        IV Access:    Peripheral IV   Procedures and diagnostic studies:   CT Head Wo Contrast  Result Date: 01/28/2020 CLINICAL DATA:  Transient ischemic attack, migraine headache EXAM: CT HEAD WITHOUT CONTRAST TECHNIQUE: Contiguous axial images were obtained from the base of the skull through the vertex without intravenous contrast. COMPARISON:  None. FINDINGS: Brain: The examination is slightly limited by  motion artifact. Remote high right parietal infarct is noted. No evidence of acute intracranial hemorrhage or infarct. No abnormal mass effect or midline shift. No abnormal intra or extra-axial mass lesion or fluid collection. Ventricular size is normal. Cerebellum is unremarkable. Vascular: No asymmetric hyperdense vasculature at the skull base. Skull: Intact Sinuses/Orbits: The paranasal sinuses are clear. The ovaries are unremarkable. Other: Mastoid air cells and middle ear cavities are clear. IMPRESSION:  Remote right parietal infarct. No evidence of acute intracranial hemorrhage or infarct. Electronically Signed   By: Helyn Numbers MD   On: 01/28/2020 18:57   DG Chest Port 1 View  Result Date: 01/28/2020 CLINICAL DATA:  Altered mental status EXAM: PORTABLE CHEST 1 VIEW COMPARISON:  Chest radiograph November 09, 2019 FINDINGS: Multiple monitoring leads overlie the patient. Stable cardiac and mediastinal contours. Tortuosity of the thoracic aorta. No large area pulmonary consolidation. No pleural effusion or pneumothorax. Thoracic spine degenerative changes. IMPRESSION: No active disease. Electronically Signed   By: Annia Belt M.D.   On: 01/28/2020 16:22     Medical Consultants:    None.  Anti-Infectives:   none  Subjective:    Kathleen Cameron she is awake this morning she relates he still feels tired and dizzy upon standing.  Objective:    Vitals:   01/30/20 0036 01/30/20 0337 01/30/20 0730 01/30/20 0848  BP:  136/87 (!) 165/82   Pulse:   (!) 59   Resp:  (!) 21 19   Temp:  (!) 97.5 F (36.4 C) 97.8 F (36.6 C)   TempSrc:   Oral   SpO2:  98% 94% 97%  Weight: 52.5 kg     Height:       SpO2: 97 % O2 Flow Rate (L/min): 4 L/min   Intake/Output Summary (Last 24 hours) at 01/30/2020 1035 Last data filed at 01/30/2020 3220 Gross per 24 hour  Intake 0 ml  Output 550 ml  Net -550 ml   Filed Weights   01/29/20 0519 01/30/20 0036  Weight: 50 kg 52.5 kg    Exam: General exam: In no acute distress. Respiratory system: Good air movement and clear to auscultation. Cardiovascular system: S1 & S2 heard, RRR. No JVD. Gastrointestinal system: Abdomen is nondistended, soft and nontender.  Extremities: No pedal edema. Skin: No rashes, lesions or ulcers  Data Reviewed:    Labs: Basic Metabolic Panel: Recent Labs  Lab 01/28/20 1503 01/28/20 1503 01/28/20 1645 01/28/20 1645 01/28/20 2126 01/28/20 2126 01/29/20 0607 01/29/20 0607 01/29/20 1152 01/30/20 0842  NA  123*   < > 119*  --  122*  --  124*  --  125* 126*  K 3.4*   < > 3.3*   < > 3.8   < > 4.5   < > 4.2 4.7  CL 72*  --   --   --  70*  --  72*  --  77* 80*  CO2 37*  --   --   --  41*  --  39*  --  38* 36*  GLUCOSE 127*  --   --   --  101*  --  96  --  120* 81  BUN 11  --   --   --  9  --  11  --  11 16  CREATININE 0.73  --   --   --  0.73  --  0.72  --  0.65 0.87  CALCIUM 9.3  --   --   --  9.4  --  9.2  --  9.3 9.3  MG  --   --   --   --  1.5*  --   --   --   --   --    < > = values in this interval not displayed.   GFR Estimated Creatinine Clearance: 59.8 mL/min (by C-G formula based on SCr of 0.87 mg/dL). Liver Function Tests: Recent Labs  Lab 01/28/20 1503  AST 34  ALT 17  ALKPHOS 51  BILITOT 0.6  PROT 7.6  ALBUMIN 3.8   No results for input(s): LIPASE, AMYLASE in the last 168 hours. No results for input(s): AMMONIA in the last 168 hours. Coagulation profile No results for input(s): INR, PROTIME in the last 168 hours. COVID-19 Labs  No results for input(s): DDIMER, FERRITIN, LDH, CRP in the last 72 hours.  Lab Results  Component Value Date   SARSCOV2NAA NEGATIVE 01/28/2020   SARSCOV2NAA NEGATIVE 11/05/2019   SARSCOV2NAA NEGATIVE 11/02/2019   SARSCOV2NAA NEGATIVE 07/24/2019    CBC: Recent Labs  Lab 01/28/20 1503 01/28/20 1645 01/29/20 0607  WBC 6.5  --  6.5  NEUTROABS 5.1  --   --   HGB 10.9* 12.2 12.2  HCT 34.2* 36.0 37.4  MCV 90.2  --  88.0  PLT 265  --  289   Cardiac Enzymes: No results for input(s): CKTOTAL, CKMB, CKMBINDEX, TROPONINI in the last 168 hours. BNP (last 3 results) No results for input(s): PROBNP in the last 8760 hours. CBG: No results for input(s): GLUCAP in the last 168 hours. D-Dimer: No results for input(s): DDIMER in the last 72 hours. Hgb A1c: No results for input(s): HGBA1C in the last 72 hours. Lipid Profile: No results for input(s): CHOL, HDL, LDLCALC, TRIG, CHOLHDL, LDLDIRECT in the last 72 hours. Thyroid function  studies: Recent Labs    01/28/20 23-Aug-2125  TSH 27.195*   Anemia work up: No results for input(s): VITAMINB12, FOLATE, FERRITIN, TIBC, IRON, RETICCTPCT in the last 72 hours. Sepsis Labs: Recent Labs  Lab 01/28/20 1503 01/29/20 0607  WBC 6.5 6.5   Microbiology Recent Results (from the past 240 hour(s))  Respiratory Panel by RT PCR (Flu A&B, Covid) -     Status: None   Collection Time: 01/28/20  3:13 PM   Specimen: Nasopharyngeal  Result Value Ref Range Status   SARS Coronavirus 2 by RT PCR NEGATIVE NEGATIVE Final    Comment: (NOTE) SARS-CoV-2 target nucleic acids are NOT DETECTED.  The SARS-CoV-2 RNA is generally detectable in upper respiratoy specimens during the acute phase of infection. The lowest concentration of SARS-CoV-2 viral copies this assay can detect is 131 copies/mL. A negative result does not preclude SARS-Cov-2 infection and should not be used as the sole basis for treatment or other patient management decisions. A negative result may occur with  improper specimen collection/handling, submission of specimen other than nasopharyngeal swab, presence of viral mutation(s) within the areas targeted by this assay, and inadequate number of viral copies (<131 copies/mL). A negative result must be combined with clinical observations, patient history, and epidemiological information. The expected result is Negative.  Fact Sheet for Patients:  https://www.moore.com/  Fact Sheet for Healthcare Providers:  https://www.young.biz/  This test is no t yet approved or cleared by the [place] FDA and  has been authorized for detection and/or diagnosis of SARS-CoV-2 by FDA under an Emergency Use Authorization (EUA). This EUA will remain  in effect (meaning this test can be used) for the duration of the COVID-19 declaration  under Section 564(b)(1) of the Act, 21 U.S.C. section 360bbb-3(b)(1), unless the authorization is terminated  or revoked sooner.     Influenza A by PCR NEGATIVE NEGATIVE Final   Influenza B by PCR NEGATIVE NEGATIVE Final    Comment: (NOTE) The Xpert Xpress SARS-CoV-2/FLU/RSV assay is intended as an aid in  the diagnosis of influenza from Nasopharyngeal swab specimens and  should not be used as a sole basis for treatment. Nasal washings and  aspirates are unacceptable for Xpert Xpress SARS-CoV-2/FLU/RSV  testing.  Fact Sheet for Patients: https://www.moore.com/https://www.fda.gov/media/142436/download  Fact Sheet for Healthcare Providers: https://www.young.biz/https://www.fda.gov/media/142435/download  This test is not yet approved or cleared by the [place] FDA and  has been authorized for detection and/or diagnosis of SARS-CoV-2 by  FDA under an Emergency Use Authorization (EUA). This EUA will remain  in effect (meaning this test can be used) for the duration of the  Covid-19 declaration under Section 564(b)(1) of the Act, 21  U.S.C. section 360bbb-3(b)(1), unless the authorization is  terminated or revoked. Performed at [facility], [address] [place], [place] [address]      Medications:   . amLODipine  10 mg Oral Daily  . aspirin EC  81 mg Oral Daily  . bisoprolol  10 mg Oral Daily  . enoxaparin (LOVENOX) injection  40 mg Subcutaneous Q24H  . escitalopram  10 mg Oral Daily  . fluticasone furoate-vilanterol  1 puff Inhalation Daily   And  . umeclidinium bromide  1 puff Inhalation Daily  . prednisoLONE acetate  1 drop Left Eye TID AC & HS  . rosuvastatin  20 mg Oral Daily  . valACYclovir  1,000 mg Oral Daily   Continuous Infusions:     LOS: 2 days   Marinda ElkAbraham Feliz Ortiz  [facility]  01/30/2020, 10:35 AM

## 2020-01-30 NOTE — Evaluation (Signed)
Physical Therapy Evaluation Patient Details Name: Kathleen Cameron MRN: [id number] DOB: 05/18/1965 Today's Date: 01/30/2020   History of Present Illness  54 y.o. female with medical diagnosis of COPD on 2 L O2, hypertension, depression/anxiety, hld, recent shingles infection, presents with chief concern of lethargy and poor PO intake. In ED was found to have hyponatremia. Admitted 01/28/20  Clinical Impression  PTA pt living with husband in single story apartment with 1 step to enter. Pt reports using RW for household ambulation and husband pushes her in transport chair to go to doctors appointments. Pt's husband assists with ADLs and husband provides iADLs. Pt is limited in safe mobility with decreased cognition in presence of generalized weakness, decreased balance and decreased endurance. Pt currently is min guard for transfers, only able to static stand for ~15 sec before needing UE support for balance. Pt requires min guard progressing to min A for ambulation of 30 feet with RW. PT recommending HHPT, husband reports he can provide 24 hr support. PT will continue to follow acutely.     Follow Up Recommendations Home health PT;Supervision/Assistance - 24 hour    Equipment Recommendations  3in1 (PT)    Recommendations for Other Services OT consult     Precautions / Restrictions Precautions Precautions: None Precaution Comments: 2L O2 at home Restrictions Weight Bearing Restrictions: No      Mobility  Bed Mobility               General bed mobility comments: sitting EoB on entry, return to seated EoB at end of session   Transfers Overall transfer level: Needs assistance Equipment used: None Transfers: Sit to/from Stand Sit to Stand: Min guard         General transfer comment: min guard for safety able to static stand for about 15 sec before needing UE support on RW  Ambulation/Gait Ambulation/Gait assistance: Min assist;Min guard Gait Distance (Feet): 30  Feet Assistive device: Rolling walker (2 wheeled) Gait Pattern/deviations: Step-through pattern;Decreased step length - right;Decreased step length - left;Shuffle;Trunk flexed Gait velocity: slowed Gait velocity interpretation: <1.31 ft/sec, indicative of household ambulator General Gait Details: min guard for safety initially, inital c/o dizziness which disipated, pt requiring vc for upright posture and proximity to RW, progressed to need of min A for stability to return to bed          Balance Overall balance assessment: Needs assistance Sitting-balance support: Feet unsupported;No upper extremity supported Sitting balance-Leahy Scale: Fair     Standing balance support: No upper extremity supported;During functional activity;Bilateral upper extremity supported Standing balance-Leahy Scale: Poor Standing balance comment: able to tolerate approx 15 sec before needing UE support on RW                             Pertinent Vitals/Pain Pain Assessment: No/denies pain    Home Living Family/patient expects to be discharged to:: Private residence Living Arrangements: Spouse/significant other Available Help at Discharge: Family;Available 24 hours/day;Available PRN/intermittently Type of Home: Apartment Home Access: Stairs to enter   Entrance Stairs-Number of Steps: 1 Home Layout: One level Home Equipment: Grab bars - tub/shower;Walker - 4 wheels;Transport chair      Prior Function Level of Independence: Needs assistance   Gait / Transfers Assistance Needed: transport chair for community level   ADL's / Homemaking Assistance Needed: husband assists with ADLs and does all iADLs           Extremity/Trunk Assessment   Upper Extremity  Assessment Upper Extremity Assessment: Generalized weakness (L >R)    Lower Extremity Assessment Lower Extremity Assessment: Generalized weakness;LLE deficits/detail;RLE deficits/detail RLE Deficits / Details: unable to attain full  AROM of hips and knees due to weakness, tremors with movement likely due to weakness  RLE Coordination: decreased fine motor;decreased gross motor LLE Deficits / Details: unable to attain full AROM of hips and knees due to weakness, tremors with movement likely due to weakness  LLE Coordination: decreased fine motor;decreased gross motor    Cervical / Trunk Assessment Cervical / Trunk Assessment: Kyphotic  Communication   Communication: No difficulties  Cognition Arousal/Alertness: Awake/alert Behavior During Therapy: WFL for tasks assessed/performed;Flat affect Overall Cognitive Status: Impaired/Different from baseline Area of Impairment: Orientation                 Orientation Level: Time                    General Comments General comments (skin integrity, edema, etc.): Pt on 4L O2 via [place] on entry and left for ambulation, poor pleth waveform with ambulation, when seated back on bed and O2 monitor fixed found to be 96% O2 and rebounded to 100%, decreased O2 to 3L via  and able to maintain SaO2 >96%O2, max HR noted with ambulation 96bpm         Assessment/Plan    PT Assessment Patient needs continued PT services  PT Problem List Decreased strength;Decreased activity tolerance;Decreased range of motion;Decreased balance;Decreased mobility;Decreased cognition;Decreased coordination;Cardiopulmonary status limiting activity       PT Treatment Interventions DME instruction;Gait training;Functional mobility training;Therapeutic activities;Therapeutic exercise;Balance training;Cognitive remediation;Patient/family education    PT Goals (Current goals can be found in the Care Plan section)  Acute Rehab PT Goals Patient Stated Goal: get warm PT Goal Formulation: With patient/family Time For Goal Achievement: 02/13/20 Potential to Achieve Goals: Fair    Frequency Min 3X/week    AM-PAC PT "6 Clicks" Mobility  Outcome Measure Help needed turning from your back to your  side while in a flat bed without using bedrails?: None Help needed moving from lying on your back to sitting on the side of a flat bed without using bedrails?: A Little Help needed moving to and from a bed to a chair (including a wheelchair)?: None Help needed standing up from a chair using your arms (e.g., wheelchair or bedside chair)?: None Help needed to walk in hospital room?: A Little Help needed climbing 3-5 steps with a railing? : A Lot 6 Click Score: 20    End of Session Equipment Utilized During Treatment: Gait belt;Oxygen Activity Tolerance: Patient limited by fatigue;Other (comment) (weakness) Patient left: in bed;with call bell/phone within reach;with family/visitor present Nurse Communication: Mobility status PT Visit Diagnosis: Unsteadiness on feet (R26.81);Other abnormalities of gait and mobility (R26.89);Muscle weakness (generalized) (M62.81);Difficulty in walking, not elsewhere classified (R26.2);Adult, failure to thrive (R62.7)    Time: 9381-0175 PT Time Calculation (min) (ACUTE ONLY): 20 min   Charges:   PT Evaluation $PT Eval Moderate Complexity: 1 Mod          Jewelene Mairena B. Beverely Risen PT, DPT Acute Rehabilitation Services Pager [phone] Office [phone]   Elon Alas Fleet 01/30/2020, 10:58 AM

## 2020-01-30 NOTE — Progress Notes (Signed)
Lower Ext study completed.  ° °See CVProc for preliminary results.  ° °Destan Franchini, RDMS, RVT ° °

## 2020-01-30 NOTE — Plan of Care (Signed)
  Problem: Education: Goal: Knowledge of General Education information will improve Description Including pain rating scale, medication(s)/side effects and non-pharmacologic comfort measures Outcome: Progressing   

## 2020-01-31 ENCOUNTER — Encounter: Payer: Self-pay | Admitting: Emergency Medicine

## 2020-01-31 DIAGNOSIS — J9611 Chronic respiratory failure with hypoxia: Secondary | ICD-10-CM | POA: Diagnosis not present

## 2020-01-31 DIAGNOSIS — I10 Essential (primary) hypertension: Secondary | ICD-10-CM | POA: Diagnosis not present

## 2020-01-31 DIAGNOSIS — G9341 Metabolic encephalopathy: Secondary | ICD-10-CM | POA: Diagnosis not present

## 2020-01-31 DIAGNOSIS — E871 Hypo-osmolality and hyponatremia: Secondary | ICD-10-CM | POA: Diagnosis not present

## 2020-01-31 LAB — BASIC METABOLIC PANEL
Anion gap: 7 (ref 5–15)
Anion gap: 8 (ref 5–15)
BUN: 17 mg/dL (ref 6–20)
BUN: 15 mg/dL (ref 6–20)
CO2: 38 mmol/L — ABNORMAL HIGH (ref 22–32)
CO2: 38 mmol/L — ABNORMAL HIGH (ref 22–32)
Calcium: 8.9 mg/dL (ref 8.9–10.3)
Calcium: 9 mg/dL (ref 8.9–10.3)
Chloride: 82 mmol/L — ABNORMAL LOW (ref 98–111)
Chloride: 83 mmol/L — ABNORMAL LOW (ref 98–111)
Creatinine, Ser: 0.66 mg/dL (ref 0.44–1.00)
Creatinine, Ser: 0.67 mg/dL (ref 0.44–1.00)
GFR, Estimated: >60 mL/min (ref >60)
GFR, Estimated: >60 mL/min (ref >60)
Glucose, Bld: 139 mg/dL — ABNORMAL HIGH (ref 70–99)
Glucose, Bld: 116 mg/dL — ABNORMAL HIGH (ref 70–99)
Potassium: 4.1 mmol/L (ref 3.5–5.1)
Potassium: 4.3 mmol/L (ref 3.5–5.1)
Sodium: 127 mmol/L — ABNORMAL LOW (ref 135–145)
Sodium: 129 mmol/L — ABNORMAL LOW (ref 135–145)

## 2020-01-31 LAB — T4, FREE: Free T4: <0.25 ng/dL — ABNORMAL LOW (ref 0.61–1.12)

## 2020-01-31 LAB — TSH: TSH: 28.545 u[IU]/mL — ABNORMAL HIGH (ref 0.350–4.500)

## 2020-01-31 MED ORDER — FAMOTIDINE 20 MG PO TABS
10.0000 mg | ORAL_TABLET | Freq: Two times a day (BID) | ORAL | Status: DC
  Administered 2020-01-31 – 2020-02-02 (×4): 10 mg via ORAL
  Filled 2020-01-31 (×4): qty 1

## 2020-01-31 MED ORDER — LEVOTHYROXINE SODIUM 50 MCG PO TABS: 50.0000 ug | ORAL_TABLET | Freq: Every day | ORAL | Status: DC

## 2020-01-31 MED ORDER — SODIUM POLYSTYRENE SULFONATE 15 GM/60ML PO SUSP
15.0000 g | Freq: Once | ORAL | Status: AC
  Administered 2020-01-31: 15 g via ORAL
  Filled 2020-01-31: qty 60

## 2020-01-31 NOTE — Evaluation (Signed)
Occupational Therapy Evaluation Patient Details Name: Kathleen Cameron MRN: [id number] DOB: [birth year] Today's Date: 01/31/2020    History of Present Illness 54 y.o. female with medical diagnosis of COPD on 2 L O2, hypertension, depression/anxiety, hld, recent shingles infection, presents with chief concern of lethargy and poor PO intake. In ED was found to have hyponatremia. Admitted 01/28/20   Clinical Impression   PTA, pt lives with significant other and reports typically able to ambulate household distances, toilet self and dress self without assistance. Pt's family assist with IADLs and bathing tasks as needed. Pt presents with deficits in cognition (garbled speech and confusion noted), strength, endurance, dynamic balance and cardiopulmonary tolerance. Pt requires min guard for simple transfers (activity tolerance too low for mobilizing further). Pt requires Min A for UB ADLs and Mod A for LB ADLs. Pt with difficulty implementing pursed lip breathing during session. Pt reports family able to provide 24/7 supervision. Pt would benefit from [facility] follow-up to maximize endurance and ADL independence in home environment.   Pt received on 4 L O2, SpO2 100%. Able to maintain >95% on 2 L O2 at rest. Use of 3 L O2 with minimal activity, 84% and above.     Follow Up Recommendations  Home health OT;Supervision/Assistance - 24 hour    Equipment Recommendations  None recommended by OT (appears to have all needed DME)    Recommendations for Other Services       Precautions / Restrictions Precautions Precautions: Other (comment) Precaution Comments: watch O2 Restrictions Weight Bearing Restrictions: No      Mobility Bed Mobility               General bed mobility comments: sitting EOB on entry and on exit  Transfers Overall transfer level: Needs assistance Equipment used: None;1 person hand held assist Transfers: Sit to/from Stand;Stand Pivot Transfers Sit to Stand: Min  guard Stand pivot transfers: Min guard       General transfer comment: mnin guard for safety, no major LOB noted    Balance Overall balance assessment: Needs assistance Sitting-balance support: Feet unsupported;No upper extremity supported Sitting balance-Leahy Scale: Fair     Standing balance support: No upper extremity supported;During functional activity;Bilateral upper extremity supported Standing balance-Leahy Scale: Poor Standing balance comment: reaching for BSC armrest for support during pivot                           ADL either performed or assessed with clinical judgement   ADL Overall ADL's : Needs assistance/impaired Eating/Feeding: Set up;Sitting Eating/Feeding Details (indicate cue type and reason): Noted with all food on lunch tray - reports no appetite Grooming: Set up;Sitting   Upper Body Bathing: Minimal assistance;Sitting   Lower Body Bathing: Moderate assistance;Sit to/from stand;Sitting/lateral leans   Upper Body Dressing : Minimal assistance;Sitting   Lower Body Dressing: Moderate assistance;Sit to/from stand Lower Body Dressing Details (indicate cue type and reason): Assistance needed to don underwear over waist due to fatigue and pt forgetting to pull them up after toileting task Toilet Transfer: Minimal assistance;Stand-pivot;BSC Toilet Transfer Details (indicate cue type and reason): Min guard for steadying with pivot to [facility] without AD Toileting- Clothing Manipulation and Hygiene: Sit to/from stand;Min guard Toileting - Clothing Manipulation Details (indicate cue type and reason): able to perform peri care with lateral leans, Min A for clothing mgmt     Functional mobility during ADLs: Min guard General ADL Comments: Pt limited primarily by decreased cardiopulmonary tolerance, decreased endurance  Vision Baseline Vision/History: No visual deficits Patient Visual Report: No change from baseline Vision Assessment?: No apparent  visual deficits     Perception     Praxis      Pertinent Vitals/Pain Pain Assessment: No/denies pain     Hand Dominance Right   Extremity/Trunk Assessment Upper Extremity Assessment Upper Extremity Assessment: Generalized weakness   Lower Extremity Assessment Lower Extremity Assessment: Defer to PT evaluation   Cervical / Trunk Assessment Cervical / Trunk Assessment: Kyphotic   Communication Communication Communication: No difficulties   Cognition Arousal/Alertness: Awake/alert Behavior During Therapy: WFL for tasks assessed/performed;Flat affect Overall Cognitive Status: Impaired/Different from baseline Area of Impairment: Orientation;Memory;Awareness;Safety/judgement;Problem solving                 Orientation Level: Time   Memory: Decreased short-term memory   Safety/Judgement: Decreased awareness of deficits;Decreased awareness of safety Awareness: Emergent Problem Solving: Slow processing;Decreased initiation;Difficulty sequencing;Requires verbal cues;Requires tactile cues General Comments: Pt overall with appropriate responses during session though answers changed at times. Pt with some garbled speech, often saying "yep" throughout session   General Comments  Pt received on 4 L O2, SpO2 100%, decreased to baseline 2 L O2 with pt sustaining >95% at rest. Increased to 3 L O2 for BSC transfer with SpO2 >84%. Poor pleth at times with inaccurate readings, but left at 89% on 3 L O2. Educated on pursed lip breathing, pacing and rest breaks.     Exercises     Shoulder Instructions      Home Living Family/patient expects to be discharged to:: Private residence Living Arrangements: Spouse/significant other Available Help at Discharge: Family;Available 24 hours/day;Available PRN/intermittently Type of Home: Apartment Home Access: Stairs to enter Entrance Stairs-Number of Steps: 1 Entrance Stairs-Rails: Right Home Layout: One level     Bathroom Shower/Tub:  Chief Strategy Officer: Standard Bathroom Accessibility: Yes   Home Equipment: Grab bars - tub/shower;Walker - 4 wheels;Transport chair;Shower seat;Bedside commode   Additional Comments: Pt reports her husband works but withhim and her children, she has 24/7 support      Prior Functioning/Environment Level of Independence: Needs assistance  Gait / Transfers Assistance Needed: transport chair for community level  ADL's / Homemaking Assistance Needed: Pt reports typically able to mobilize to/from bathroom and dress self. Assistance with bathing, iADLs            OT Problem List: Decreased strength;Decreased activity tolerance;Impaired balance (sitting and/or standing);Decreased safety awareness;Cardiopulmonary status limiting activity;Decreased cognition      OT Treatment/Interventions: Self-care/ADL training;Therapeutic exercise;Energy conservation;DME and/or AE instruction;Therapeutic activities;Patient/family education;Balance training    OT Goals(Current goals can be found in the care plan section) Acute Rehab OT Goals Patient Stated Goal: breathe better, feel better OT Goal Formulation: With patient Time For Goal Achievement: 02/14/20 Potential to Achieve Goals: Good ADL Goals Pt Will Transfer to Toilet: with supervision;ambulating;regular height toilet Pt Will Perform Toileting - Clothing Manipulation and hygiene: with modified independence;sit to/from stand;sitting/lateral leans Additional ADL Goal #1: Pt to demonstrate implementation of at least 3 energy conservation strategies during ADLs Additional ADL Goal #2: Pt to demonstrate implementation of pursed lip breathing 75% of the time without verbal cues to maintain SpO2 >88%  OT Frequency: Min 2X/week   Barriers to D/C:            Co-evaluation              AM-PAC OT "6 Clicks" Daily Activity     Outcome Measure Help from another person eating  meals?: A Little Help from another person taking care  of personal grooming?: A Little Help from another person toileting, which includes using toliet, bedpan, or urinal?: A Little Help from another person bathing (including washing, rinsing, drying)?: A Lot Help from another person to put on and taking off regular upper body clothing?: A Little Help from another person to put on and taking off regular lower body clothing?: A Lot 6 Click Score: 16   End of Session Equipment Utilized During Treatment: Oxygen Nurse Communication: Mobility status;Other (comment) (O2)  Activity Tolerance: Patient tolerated treatment well Patient left: in bed;with call bell/phone within reach  OT Visit Diagnosis: Unsteadiness on feet (R26.81);Other abnormalities of gait and mobility (R26.89);Muscle weakness (generalized) (M62.81);Other (comment) (decreased cardiopulmonary tolerance)                Time: 3329-5188 OT Time Calculation (min): 28 min Charges:  OT General Charges $OT Visit: 1 Visit OT Evaluation $OT Eval Moderate Complexity: 1 Mod OT Treatments $Self Care/Home Management : 8-22 mins  Lorre Munroe, OTR/L  Lorre Munroe 01/31/2020, 1:08 PM

## 2020-01-31 NOTE — Progress Notes (Signed)
[facility] PROGRESS NOTE    Progress Note  Kathleen Cameron  WUJ:[id number] DOB: 11-Sep-1965 DOA: 01/28/2020 PCP: [name], MD     Brief Narrative:   Kathleen Cameron is an 54 y.o. female past medical history significant for essential hypertension COPD comes EMS for chief complaint of excessive sleepiness and poor oral intake per husband he called EMS she had been excessively sleeping and with poor coordination and poor oral intake for the last 7 days prior to admission.  The head was negative in the ED showed old infarcts chest x-ray showed no acute findings.  Assessment/Plan:   Acute metabolic encephalopathy secondary to hypovolemic hyponatremia: She relates she still dizzy upon standing etiology likely multifactorial due to poor oral intake in the setting of hydrochlorothiazide. Continue normal saline at urinary for an additional 24 hours. Her sodium continues to improve mentation is back to baseline. We will try not to increase sodium more than 10 mg in a 24-hour.  We will continue normal saline at a slower rate. Continue to hold diuretic therapy.  Essential hypertension: Continue to hold hydrochlorothiazide,  Her blood pressure is improving on beta-blocker and Norvasc. Continue to hold hydrochlorothiazide and ACE inhibitor.  Sinus bradycardia: Now improved resume her bisoprolol.  Asymmetric lower extremity edema: Chest x-ray shows no volume overloaded. Lower extremity Dopplers no evidence of DVT.  Chronic respiratory failure with hypercarbia secondary to COPD: Appears to be compensated continue her 2 L of oxygen she wears at home. Continue inhalers.  Recent shingle infections of the left eye and eyelid: Resume valacyclovir continue steroids ophthalmic suspension.  Hyperlipidemia: Continue statins.  New hypothyroidism: Recheck TSH and free T4, Check cortisol level tomorrow morning. She has bilateral exophthalmia concern about Graves' she will  need to be plugged into endocrinology as an outpatient.  DVT prophylaxis: lovenox Family Communication:none Status is: Inpatient  Remains inpatient appropriate because:Hemodynamically unstable   Dispo: The patient is from: Home              Anticipated d/c is to: Home              Anticipated d/c date is: 3 days              Patient currently is not medically stable to d/c.        Code Status:     Code Status Orders  (From admission, onward)         Start     Ordered   01/28/20 2040  Full code  Continuous        01/28/20 2040        Code Status History    Date Active Date Inactive Code Status Order ID Comments User Context   11/06/2019 0022 11/12/2019 1838 Full Code [id number]  Anselm Jungling, DO ED   07/24/2019 0220 07/24/2019 2230 Full Code [id number]  Hillary Bow, DO ED   04/13/2019 1237 04/17/2019 1554 Full Code [id number]  Ollen Bowl, MD ED   12/20/2018 0356 12/22/2018 2018 Full Code [id number]  Eduard Clos, MD ED   Advance Care Planning Activity        IV Access:    Peripheral IV   Procedures and diagnostic studies:   VAS [place] LOWER EXTREMITY VENOUS (DVT)  Result Date: 01/30/2020  Lower Venous DVTStudy Indications: Edema, Swelling, and + Covid.  Performing Technologist: Jannet Askew RCT RDMS  Examination Guidelines: A complete evaluation includes B-mode imaging, spectral Doppler, color Doppler, and power Doppler as needed of all  accessible portions of each vessel. Bilateral testing is considered an integral part of a complete examination. Limited examinations for reoccurring indications may be performed as noted. The reflux portion of the exam is performed with the patient in reverse Trendelenburg.  +---------+---------------+---------+-----------+----------+--------------+ RIGHT    CompressibilityPhasicitySpontaneityPropertiesThrombus Aging +---------+---------------+---------+-----------+----------+--------------+ CFV      Full            Yes      Yes                                 +---------+---------------+---------+-----------+----------+--------------+ SFJ      Full                                                        +---------+---------------+---------+-----------+----------+--------------+ FV Prox  Full                                                        +---------+---------------+---------+-----------+----------+--------------+ FV Mid   Full                                                        +---------+---------------+---------+-----------+----------+--------------+ FV DistalFull                                                        +---------+---------------+---------+-----------+----------+--------------+ PFV      Full                                                        +---------+---------------+---------+-----------+----------+--------------+ POP      Full           Yes      Yes                                 +---------+---------------+---------+-----------+----------+--------------+ PTV      Full                                                        +---------+---------------+---------+-----------+----------+--------------+ PERO     Full                                                        +---------+---------------+---------+-----------+----------+--------------+   +---------+---------------+---------+-----------+----------+--------------+ LEFT  CompressibilityPhasicitySpontaneityPropertiesThrombus Aging +---------+---------------+---------+-----------+----------+--------------+ CFV      Full           Yes      Yes                                 +---------+---------------+---------+-----------+----------+--------------+ SFJ      Full                                                        +---------+---------------+---------+-----------+----------+--------------+ FV Prox  Full                                                         +---------+---------------+---------+-----------+----------+--------------+ FV Mid   Full                                                        +---------+---------------+---------+-----------+----------+--------------+ FV DistalFull                                                        +---------+---------------+---------+-----------+----------+--------------+ PFV      Full                                                        +---------+---------------+---------+-----------+----------+--------------+ POP      Full           Yes      Yes                                 +---------+---------------+---------+-----------+----------+--------------+ PTV      Full                                                        +---------+---------------+---------+-----------+----------+--------------+ PERO     Full                                                        +---------+---------------+---------+-----------+----------+--------------+     Summary: RIGHT: - There is no evidence of deep vein thrombosis in the lower extremity.  - No cystic structure found in the popliteal fossa.  LEFT: - There is no evidence of deep vein thrombosis in the lower extremity.  - No cystic structure found in the  popliteal fossa.  *See table(s) above for measurements and observations. Electronically signed by Sherald Hess MD on 01/30/2020 at 5:21:21 PM.    Final      Medical Consultants:    None.  Anti-Infectives:   none  Subjective:    Kathleen Cameron relates her dizziness upon standing is improved.  Objective:    Vitals:   01/31/20 0723 01/31/20 0800 01/31/20 0854 01/31/20 0900  BP:  (!) 145/82  137/76  Pulse:   67   Resp:   18   Temp: 98.1 F (36.7 C)     TempSrc: Oral     SpO2:   100%   Weight:      Height:       SpO2: 100 % O2 Flow Rate (L/min): 2 L/min   Intake/Output Summary (Last 24 hours) at 01/31/2020 1037 Last data filed at 01/31/2020  0829 Gross per 24 hour  Intake 2017.62 ml  Output 200 ml  Net 1817.62 ml   Filed Weights   01/29/20 0519 01/30/20 0036 01/31/20 0300  Weight: 50 kg 52.5 kg 53.4 kg    Exam: General exam: In no acute distress. Respiratory system: Good air movement and clear to auscultation. Cardiovascular system: S1 & S2 heard, RRR. No JVD. Gastrointestinal system: Abdomen is nondistended, soft and nontender.  Extremities: No pedal edema. Skin: No rashes, lesions or ulcers  Data Reviewed:    Labs: Basic Metabolic Panel: Recent Labs  Lab 01/28/20 2126 01/28/20 2126 01/29/20 0607 01/29/20 0607 01/29/20 1152 01/29/20 1152 01/30/20 0842 01/30/20 0842 01/30/20 1055 01/30/20 2138  NA 122*   < > 124*  --  125*  --  126*  --  127* 128*  K 3.8   < > 4.5   < > 4.2   < > 4.7   < > 4.5 5.2*  CL 70*   < > 72*  --  77*  --  80*  --  80* 83*  CO2 41*   < > 39*  --  38*  --  36*  --  35* 35*  GLUCOSE 101*   < > 96  --  120*  --  81  --  77 74  BUN 9   < > 11  --  11  --  16  --  17 21*  CREATININE 0.73   < > 0.72  --  0.65  --  0.87  --  0.76 0.70  CALCIUM 9.4   < > 9.2  --  9.3  --  9.3  --  9.0 8.8*  MG 1.5*  --   --   --   --   --   --   --   --   --    < > = values in this interval not displayed.   GFR Estimated Creatinine Clearance: 66.2 mL/min (by C-G formula based on SCr of 0.7 mg/dL). Liver Function Tests: Recent Labs  Lab 01/28/20 1503  AST 34  ALT 17  ALKPHOS 51  BILITOT 0.6  PROT 7.6  ALBUMIN 3.8   No results for input(s): LIPASE, AMYLASE in the last 168 hours. No results for input(s): AMMONIA in the last 168 hours. Coagulation profile No results for input(s): INR, PROTIME in the last 168 hours. COVID-19 Labs  No results for input(s): DDIMER, FERRITIN, LDH, CRP in the last 72 hours.  Lab Results  Component Value Date   SARSCOV2NAA NEGATIVE 01/28/2020   SARSCOV2NAA NEGATIVE 11/05/2019   SARSCOV2NAA NEGATIVE 11/02/2019  SARSCOV2NAA NEGATIVE 07/24/2019     CBC: Recent Labs  Lab 01/28/20 1503 01/28/20 1645 01/29/20 0607  WBC 6.5  --  6.5  NEUTROABS 5.1  --   --   HGB 10.9* 12.2 12.2  HCT 34.2* 36.0 37.4  MCV 90.2  --  88.0  PLT 265  --  289   Cardiac Enzymes: No results for input(s): CKTOTAL, CKMB, CKMBINDEX, TROPONINI in the last 168 hours. BNP (last 3 results) No results for input(s): PROBNP in the last 8760 hours. CBG: No results for input(s): GLUCAP in the last 168 hours. D-Dimer: No results for input(s): DDIMER in the last 72 hours. Hgb A1c: No results for input(s): HGBA1C in the last 72 hours. Lipid Profile: No results for input(s): CHOL, HDL, LDLCALC, TRIG, CHOLHDL, LDLDIRECT in the last 72 hours. Thyroid function studies: Recent Labs    01/28/20 08-17-25  TSH 27.195*   Anemia work up: No results for input(s): VITAMINB12, FOLATE, FERRITIN, TIBC, IRON, RETICCTPCT in the last 72 hours. Sepsis Labs: Recent Labs  Lab 01/28/20 1503 01/29/20 0607  WBC 6.5 6.5   Microbiology Recent Results (from the past 240 hour(s))  Respiratory Panel by RT PCR (Flu A&B, Covid) -     Status: None   Collection Time: 01/28/20  3:13 PM   Specimen: Nasopharyngeal  Result Value Ref Range Status   SARS Coronavirus 2 by RT PCR NEGATIVE NEGATIVE Final    Comment: (NOTE) SARS-CoV-2 target nucleic acids are NOT DETECTED.  The SARS-CoV-2 RNA is generally detectable in upper respiratoy specimens during the acute phase of infection. The lowest concentration of SARS-CoV-2 viral copies this assay can detect is 131 copies/mL. A negative result does not preclude SARS-Cov-2 infection and should not be used as the sole basis for treatment or other patient management decisions. A negative result may occur with  improper specimen collection/handling, submission of specimen other than nasopharyngeal swab, presence of viral mutation(s) within the areas targeted by this assay, and inadequate number of viral copies (<131 copies/mL). A negative  result must be combined with clinical observations, patient history, and epidemiological information. The expected result is Negative.  Fact Sheet for Patients:  https://www.moore.com/  Fact Sheet for Healthcare Providers:  https://www.young.biz/  This test is no t yet approved or cleared by the [place] FDA and  has been authorized for detection and/or diagnosis of SARS-CoV-2 by FDA under an Emergency Use Authorization (EUA). This EUA will remain  in effect (meaning this test can be used) for the duration of the COVID-19 declaration under Section 564(b)(1) of the Act, 21 U.S.C. section 360bbb-3(b)(1), unless the authorization is terminated or revoked sooner.     Influenza A by PCR NEGATIVE NEGATIVE Final   Influenza B by PCR NEGATIVE NEGATIVE Final    Comment: (NOTE) The Xpert Xpress SARS-CoV-2/FLU/RSV assay is intended as an aid in  the diagnosis of influenza from Nasopharyngeal swab specimens and  should not be used as a sole basis for treatment. Nasal washings and  aspirates are unacceptable for Xpert Xpress SARS-CoV-2/FLU/RSV  testing.  Fact Sheet for Patients: https://www.moore.com/  Fact Sheet for Healthcare Providers: https://www.young.biz/  This test is not yet approved or cleared by the [place] FDA and  has been authorized for detection and/or diagnosis of SARS-CoV-2 by  FDA under an Emergency Use Authorization (EUA). This EUA will remain  in effect (meaning this test can be used) for the duration of the  Covid-19 declaration under Section 564(b)(1) of the Act, 21  U.S.C. section  360bbb-3(b)(1), unless the authorization is  terminated or revoked. Performed at [facility], [address] [place], [place] [address]      Medications:   . amLODipine  10 mg Oral Daily  . aspirin EC  81 mg Oral Daily  . bisoprolol  10 mg Oral Daily  . enoxaparin (LOVENOX) injection   40 mg Subcutaneous Q24H  . escitalopram  10 mg Oral Daily  . fluticasone furoate-vilanterol  1 puff Inhalation Daily   And  . umeclidinium bromide  1 puff Inhalation Daily  . prednisoLONE acetate  1 drop Left Eye TID AC & HS  . rosuvastatin  20 mg Oral Daily  . valACYclovir  1,000 mg Oral Daily   Continuous Infusions: . sodium chloride 75 mL/hr at 01/30/20 2352      LOS: 3 days   Marinda Elk  [facility]  01/31/2020, 10:37 AM

## 2020-02-01 ENCOUNTER — Inpatient Hospital Stay (HOSPITAL_COMMUNITY): Payer: PRIVATE HEALTH INSURANCE

## 2020-02-01 ENCOUNTER — Other Ambulatory Visit: Payer: Self-pay

## 2020-02-01 ENCOUNTER — Encounter (HOSPITAL_COMMUNITY): Payer: Self-pay | Admitting: Internal Medicine

## 2020-02-01 DIAGNOSIS — G9341 Metabolic encephalopathy: Secondary | ICD-10-CM | POA: Diagnosis not present

## 2020-02-01 DIAGNOSIS — R1011 Right upper quadrant pain: Secondary | ICD-10-CM

## 2020-02-01 DIAGNOSIS — J449 Chronic obstructive pulmonary disease, unspecified: Secondary | ICD-10-CM

## 2020-02-01 DIAGNOSIS — E871 Hypo-osmolality and hyponatremia: Secondary | ICD-10-CM | POA: Diagnosis not present

## 2020-02-01 DIAGNOSIS — E039 Hypothyroidism, unspecified: Secondary | ICD-10-CM

## 2020-02-01 LAB — CBC WITH DIFFERENTIAL/PLATELET
Abs Immature Granulocytes: 0.03 10*3/uL (ref 0.00–0.07)
Basophils Absolute: 0 10*3/uL (ref 0.0–0.1)
Basophils Relative: 0 %
Eosinophils Absolute: 0.1 10*3/uL (ref 0.0–0.5)
Eosinophils Relative: 1 %
HCT: 31.2 % — ABNORMAL LOW (ref 36.0–46.0)
Hemoglobin: 10.1 g/dL — ABNORMAL LOW (ref 12.0–15.0)
Immature Granulocytes: 1 %
Lymphocytes Relative: 15 %
Lymphs Abs: 0.7 10*3/uL (ref 0.7–4.0)
MCH: 28.9 pg (ref 26.0–34.0)
MCHC: 32.4 g/dL (ref 30.0–36.0)
MCV: 89.4 fL (ref 80.0–100.0)
Monocytes Absolute: 0.3 10*3/uL (ref 0.1–1.0)
Monocytes Relative: 7 %
Neutro Abs: 3.8 10*3/uL (ref 1.7–7.7)
Neutrophils Relative %: 76 %
Platelets: 266 10*3/uL (ref 150–400)
RBC: 3.49 MIL/uL — ABNORMAL LOW (ref 3.87–5.11)
RDW: 13.7 % (ref 11.5–15.5)
WBC: 5 10*3/uL (ref 4.0–10.5)
nRBC: 0 % (ref 0.0–0.2)

## 2020-02-01 LAB — BASIC METABOLIC PANEL
Anion gap: 9 (ref 5–15)
Anion gap: 7 (ref 5–15)
BUN: 8 mg/dL (ref 6–20)
BUN: 7 mg/dL (ref 6–20)
CO2: 40 mmol/L — ABNORMAL HIGH (ref 22–32)
CO2: 42 mmol/L — ABNORMAL HIGH (ref 22–32)
Calcium: 8.6 mg/dL — ABNORMAL LOW (ref 8.9–10.3)
Calcium: 8.7 mg/dL — ABNORMAL LOW (ref 8.9–10.3)
Chloride: 78 mmol/L — ABNORMAL LOW (ref 98–111)
Chloride: 80 mmol/L — ABNORMAL LOW (ref 98–111)
Creatinine, Ser: 0.59 mg/dL (ref 0.44–1.00)
Creatinine, Ser: 0.63 mg/dL (ref 0.44–1.00)
GFR, Estimated: >60 mL/min (ref >60)
GFR, Estimated: >60 mL/min (ref >60)
Glucose, Bld: 94 mg/dL (ref 70–99)
Glucose, Bld: 89 mg/dL (ref 70–99)
Potassium: 3.2 mmol/L — ABNORMAL LOW (ref 3.5–5.1)
Potassium: 3.8 mmol/L (ref 3.5–5.1)
Sodium: 127 mmol/L — ABNORMAL LOW (ref 135–145)
Sodium: 129 mmol/L — ABNORMAL LOW (ref 135–145)

## 2020-02-01 LAB — HEPATIC FUNCTION PANEL
ALT: 15 U/L (ref 0–44)
AST: 34 U/L (ref 15–41)
Albumin: 3.3 g/dL — ABNORMAL LOW (ref 3.5–5.0)
Alkaline Phosphatase: 47 U/L (ref 38–126)
Bilirubin, Direct: <0.1 mg/dL (ref 0.0–0.2)
Total Bilirubin: 0.3 mg/dL (ref 0.3–1.2)
Total Protein: 6.6 g/dL (ref 6.5–8.1)

## 2020-02-01 LAB — CORTISOL: Cortisol, Plasma: 21.6 ug/dL

## 2020-02-01 MED ORDER — POTASSIUM CHLORIDE CRYS ER 20 MEQ PO TBCR
40.0000 meq | EXTENDED_RELEASE_TABLET | Freq: Two times a day (BID) | ORAL | Status: AC
  Administered 2020-02-01 (×2): 40 meq via ORAL
  Filled 2020-02-01 (×2): qty 2

## 2020-02-01 MED ORDER — POLYETHYLENE GLYCOL 3350 17 G PO PACK: 17.0000 g | PACK | Freq: Two times a day (BID) | ORAL | Status: DC

## 2020-02-01 MED ORDER — LEVOTHYROXINE SODIUM 50 MCG PO TABS
50.0000 ug | ORAL_TABLET | Freq: Every day | ORAL | Status: DC
  Administered 2020-02-01 – 2020-02-02 (×2): 50 ug via ORAL
  Filled 2020-02-01 (×2): qty 1

## 2020-02-01 MED ORDER — SODIUM CHLORIDE 0.9 % IV SOLN: INTRAVENOUS | Status: AC

## 2020-02-01 MED ORDER — POLYETHYLENE GLYCOL 3350 17 G PO PACK
17.0000 g | PACK | Freq: Two times a day (BID) | ORAL | Status: DC
  Administered 2020-02-01 (×2): 17 g via ORAL
  Filled 2020-02-01 (×3): qty 1

## 2020-02-01 NOTE — Progress Notes (Addendum)
Pt's Husband called this nurse as pt stating that she  sees somebody sitting on the chair beside the bed. She mentioned that she sees things since she was admitted. She then  calls relatives and complain about it and she insisted to let her husband stay for tonight to feel safe. Pt alert X3. Vs stable, noted fear.   Husband agrees and staff allowed. Will monitor

## 2020-02-01 NOTE — Progress Notes (Signed)
[facility] PROGRESS NOTE    Progress Note  Kathleen Cameron  YIR:[id number] DOB: 1966/01/02 DOA: 01/28/2020 PCP: [name], MD     Brief Narrative:   Kathleen Cameron is an 54 y.o. female past medical history significant for essential hypertension COPD comes EMS for chief complaint of excessive sleepiness and poor oral intake per husband he called EMS she had been excessively sleeping and with poor coordination and poor oral intake for the last 7 days prior to admission.  The head was negative in the ED showed old infarcts chest x-ray showed no acute findings.  Assessment/Plan:   Acute metabolic encephalopathy secondary to hypovolemic hyponatremia: She relates she still dizzy upon standing etiology likely multifactorial due to poor oral intake in the setting of hydrochlorothiazide. Her sodium continues to rise slowly but rising today is 129.  We will give an additional 24 hours of IV fluids. Continue to hold diuretic therapy. All this morning was 21 which is a great response. Check a chest x-ray as she is complaining of difficulty breathing lungs are clear with decreased sounds on the right lung  Essential hypertension: Continue to hold hydrochlorothiazide,  Her blood pressure is improving on beta-blocker and Norvasc. Continue to hold hydrochlorothiazide and ACE inhibitor.  Sinus bradycardia: Now improved resume her bisoprolol.  Asymmetric lower extremity edema: Chest x-ray shows no volume overloaded. Lower extremity Dopplers no evidence of DVT.  Chronic respiratory failure with hypercarbia secondary to COPD: Appears to be compensated continue her 2 L of oxygen she wears at home. Continue inhalers.  Recent shingle infections of the left eye and eyelid: Resume valacyclovir continue steroids ophthalmic suspension.  Hyperlipidemia: Continue statins.  New hypothyroidism: Repeated TSH and free T4 abnormal cortisol level this morning was 21. We will  start on oral Synthroid will need to have a TSH recheck in 6 weeks. She will need to follow-up as an outpatient with endocrinologist to evaluate for Graves' disease.  Mild right upper quadrant pain: Check an hepatic function panel, abdominal x-ray and abdominal ultrasound  DVT prophylaxis: lovenox Family Communication:none Status is: Inpatient  Remains inpatient appropriate because:Hemodynamically unstable   Dispo: The patient is from: Home              Anticipated d/c is to: Home              Anticipated d/c date is: 1 days              Patient currently is medically stable to d/c.    Code Status:     Code Status Orders  (From admission, onward)         Start     Ordered   01/28/20 2040  Full code  Continuous        01/28/20 2040        Code Status History    Date Active Date Inactive Code Status Order ID Comments User Context   11/06/2019 0022 11/12/2019 1838 Full Code [id number]  Anselm Jungling, DO ED   07/24/2019 0220 07/24/2019 2230 Full Code [id number]  Hillary Bow, DO ED   04/13/2019 1237 04/17/2019 1554 Full Code [id number]  Ollen Bowl, MD ED   12/20/2018 0356 12/22/2018 2018 Full Code [id number]  Eduard Clos, MD ED   Advance Care Planning Activity        IV Access:    Peripheral IV   Procedures and diagnostic studies:   VAS [place] LOWER EXTREMITY VENOUS (DVT)  Result Date: 01/30/2020  Lower  Venous DVTStudy Indications: Edema, Swelling, and + Covid.  Performing Technologist: Jannet Askew RCT RDMS  Examination Guidelines: A complete evaluation includes B-mode imaging, spectral Doppler, color Doppler, and power Doppler as needed of all accessible portions of each vessel. Bilateral testing is considered an integral part of a complete examination. Limited examinations for reoccurring indications may be performed as noted. The reflux portion of the exam is performed with the patient in reverse Trendelenburg.   +---------+---------------+---------+-----------+----------+--------------+ RIGHT    CompressibilityPhasicitySpontaneityPropertiesThrombus Aging +---------+---------------+---------+-----------+----------+--------------+ CFV      Full           Yes      Yes                                 +---------+---------------+---------+-----------+----------+--------------+ SFJ      Full                                                        +---------+---------------+---------+-----------+----------+--------------+ FV Prox  Full                                                        +---------+---------------+---------+-----------+----------+--------------+ FV Mid   Full                                                        +---------+---------------+---------+-----------+----------+--------------+ FV DistalFull                                                        +---------+---------------+---------+-----------+----------+--------------+ PFV      Full                                                        +---------+---------------+---------+-----------+----------+--------------+ POP      Full           Yes      Yes                                 +---------+---------------+---------+-----------+----------+--------------+ PTV      Full                                                        +---------+---------------+---------+-----------+----------+--------------+ PERO     Full                                                        +---------+---------------+---------+-----------+----------+--------------+   +---------+---------------+---------+-----------+----------+--------------+  LEFT     CompressibilityPhasicitySpontaneityPropertiesThrombus Aging +---------+---------------+---------+-----------+----------+--------------+ CFV      Full           Yes      Yes                                  +---------+---------------+---------+-----------+----------+--------------+ SFJ      Full                                                        +---------+---------------+---------+-----------+----------+--------------+ FV Prox  Full                                                        +---------+---------------+---------+-----------+----------+--------------+ FV Mid   Full                                                        +---------+---------------+---------+-----------+----------+--------------+ FV DistalFull                                                        +---------+---------------+---------+-----------+----------+--------------+ PFV      Full                                                        +---------+---------------+---------+-----------+----------+--------------+ POP      Full           Yes      Yes                                 +---------+---------------+---------+-----------+----------+--------------+ PTV      Full                                                        +---------+---------------+---------+-----------+----------+--------------+ PERO     Full                                                        +---------+---------------+---------+-----------+----------+--------------+     Summary: RIGHT: - There is no evidence of deep vein thrombosis in the lower extremity.  - No cystic structure found in the popliteal fossa.  LEFT: - There is no evidence of deep vein thrombosis in the lower extremity.  - No   cystic structure found in the popliteal fossa.  *See table(s) above for measurements and observations. Electronically signed by Sherald Hess MD on 01/30/2020 at 5:21:21 PM.    Final      Medical Consultants:    None.  Anti-Infectives:   none  Subjective:    Kathleen Cameron relates her dizziness upon standing is improved but she feels short of breath.  Objective:    Vitals:   01/31/20 1950  01/31/20 2344 02/01/20 0400 02/01/20 0728  BP: 122/74 131/80 139/77   Pulse: 65  64   Resp:  18 18   Temp: 97.9 F (36.6 C) 97.8 F (36.6 C) 97.8 F (36.6 C) (!) 97.5 F (36.4 C)  TempSrc: Oral Oral Oral Oral  SpO2: 100% 100% 95%   Weight:   52.3 kg   Height:       SpO2: 95 % O2 Flow Rate (L/min): 2 L/min   Intake/Output Summary (Last 24 hours) at 02/01/2020 0845 Last data filed at 02/01/2020 0832 Gross per 24 hour  Intake 360 ml  Output 850 ml  Net -490 ml   Filed Weights   01/30/20 0036 01/31/20 0300 02/01/20 0400  Weight: 52.5 kg 53.4 kg 52.3 kg    Exam: General exam: In no acute distress. Respiratory system: Good air movement with decreased sounds on the right Cardiovascular system: S1 & S2 heard, RRR. No JVD. Gastrointestinal system: Soft right upper quadrant tenderness no rebound or guarding Skin: No rashes, lesions or ulcers Psychiatry: Judgement and insight appear normal. Mood & affect appropriate.  Data Reviewed:    Labs: Basic Metabolic Panel: Recent Labs  Lab 01/28/20 2126 01/29/20 0607 01/30/20 0842 01/30/20 0842 01/30/20 1055 01/30/20 1055 01/30/20 2138 01/30/20 2138 01/31/20 1119 01/31/20 2241  NA 122*   < > 126*  --  127*  --  128*  --  127* 129*  K 3.8   < > 4.7   < > 4.5   < > 5.2*   < > 4.1 4.3  CL 70*   < > 80*  --  80*  --  83*  --  82* 83*  CO2 41*   < > 36*  --  35*  --  35*  --  38* 38*  GLUCOSE 101*   < > 81  --  77  --  74  --  139* 116*  BUN 9   < > 16  --  17  --  21*  --  17 15  CREATININE 0.73   < > 0.87  --  0.76  --  0.70  --  0.66 0.67  CALCIUM 9.4   < > 9.3  --  9.0  --  8.8*  --  8.9 9.0  MG 1.5*  --   --   --   --   --   --   --   --   --    < > = values in this interval not displayed.   GFR Estimated Creatinine Clearance: 64.8 mL/min (by C-G formula based on SCr of 0.67 mg/dL). Liver Function Tests: Recent Labs  Lab 01/28/20 1503  AST 34  ALT 17  ALKPHOS 51  BILITOT 0.6  PROT 7.6  ALBUMIN 3.8   No  results for input(s): LIPASE, AMYLASE in the last 168 hours. No results for input(s): AMMONIA in the last 168 hours. Coagulation profile No results for input(s): INR, PROTIME in the last 168 hours. COVID-19 Labs  No results for input(s): DDIMER,  FERRITIN, LDH, CRP in the last 72 hours.  Lab Results  Component Value Date   SARSCOV2NAA NEGATIVE 01/28/2020   SARSCOV2NAA NEGATIVE 11/05/2019   SARSCOV2NAA NEGATIVE 11/02/2019   SARSCOV2NAA NEGATIVE 07/24/2019    CBC: Recent Labs  Lab 01/28/20 1503 01/28/20 1645 01/29/20 0607  WBC 6.5  --  6.5  NEUTROABS 5.1  --   --   HGB 10.9* 12.2 12.2  HCT 34.2* 36.0 37.4  MCV 90.2  --  88.0  PLT 265  --  289   Cardiac Enzymes: No results for input(s): CKTOTAL, CKMB, CKMBINDEX, TROPONINI in the last 168 hours. BNP (last 3 results) No results for input(s): PROBNP in the last 8760 hours. CBG: No results for input(s): GLUCAP in the last 168 hours. D-Dimer: No results for input(s): DDIMER in the last 72 hours. Hgb A1c: No results for input(s): HGBA1C in the last 72 hours. Lipid Profile: No results for input(s): CHOL, HDL, LDLCALC, TRIG, CHOLHDL, LDLDIRECT in the last 72 hours. Thyroid function studies: Recent Labs    01/31/20 1119  TSH 28.545*   Anemia work up: No results for input(s): VITAMINB12, FOLATE, FERRITIN, TIBC, IRON, RETICCTPCT in the last 72 hours. Sepsis Labs: Recent Labs  Lab 01/28/20 1503 01/29/20 0607  WBC 6.5 6.5   Microbiology Recent Results (from the past 240 hour(s))  Respiratory Panel by RT PCR (Flu A&B, Covid) -     Status: None   Collection Time: 01/28/20  3:13 PM   Specimen: Nasopharyngeal  Result Value Ref Range Status   SARS Coronavirus 2 by RT PCR NEGATIVE NEGATIVE Final    Comment: (NOTE) SARS-CoV-2 target nucleic acids are NOT DETECTED.  The SARS-CoV-2 RNA is generally detectable in upper respiratoy specimens during the acute phase of infection. The lowest concentration of SARS-CoV-2 viral  copies this assay can detect is 131 copies/mL. A negative result does not preclude SARS-Cov-2 infection and should not be used as the sole basis for treatment or other patient management decisions. A negative result may occur with  improper specimen collection/handling, submission of specimen other than nasopharyngeal swab, presence of viral mutation(s) within the areas targeted by this assay, and inadequate number of viral copies (<131 copies/mL). A negative result must be combined with clinical observations, patient history, and epidemiological information. The expected result is Negative.  Fact Sheet for Patients:  https://www.moore.com/https://www.fda.gov/media/142436/download  Fact Sheet for Healthcare Providers:  https://www.young.biz/https://www.fda.gov/media/142435/download  This test is no t yet approved or cleared by the [place] FDA and  has been authorized for detection and/or diagnosis of SARS-CoV-2 by FDA under an Emergency Use Authorization (EUA). This EUA will remain  in effect (meaning this test can be used) for the duration of the COVID-19 declaration under Section 564(b)(1) of the Act, 21 U.S.C. section 360bbb-3(b)(1), unless the authorization is terminated or revoked sooner.     Influenza A by PCR NEGATIVE NEGATIVE Final   Influenza B by PCR NEGATIVE NEGATIVE Final    Comment: (NOTE) The Xpert Xpress SARS-CoV-2/FLU/RSV assay is intended as an aid in  the diagnosis of influenza from Nasopharyngeal swab specimens and  should not be used as a sole basis for treatment. Nasal washings and  aspirates are unacceptable for Xpert Xpress SARS-CoV-2/FLU/RSV  testing.  Fact Sheet for Patients: https://www.moore.com/https://www.fda.gov/media/142436/download  Fact Sheet for Healthcare Providers: https://www.young.biz/https://www.fda.gov/media/142435/download  This test is not yet approved or cleared by the [place] FDA and  has been authorized for detection and/or diagnosis of SARS-CoV-2 by  FDA under an Emergency Use Authorization (EUA).  This  EUA will remain  in effect (meaning this test can be used) for the duration of the  Covid-19 declaration under Section 564(b)(1) of the Act, 21  U.S.C. section 360bbb-3(b)(1), unless the authorization is  terminated or revoked. Performed at [facility], [address] [place], [place] [address]      Medications:   . amLODipine  10 mg Oral Daily  . aspirin EC  81 mg Oral Daily  . bisoprolol  10 mg Oral Daily  . enoxaparin (LOVENOX) injection  40 mg Subcutaneous Q24H  . escitalopram  10 mg Oral Daily  . famotidine  10 mg Oral BID  . fluticasone furoate-vilanterol  1 puff Inhalation Daily   And  . umeclidinium bromide  1 puff Inhalation Daily  . prednisoLONE acetate  1 drop Left Eye TID AC & HS  . rosuvastatin  20 mg Oral Daily  . valACYclovir  1,000 mg Oral Daily   Continuous Infusions: . sodium chloride 100 mL/hr at 02/01/20 6045      LOS: 4 days   Marinda Elk  [facility]  02/01/2020, 8:45 AM

## 2020-02-01 NOTE — Progress Notes (Signed)
Physical Therapy Treatment Patient Details Name: Kathleen Cameron MRN: [id number] DOB: 10-09-1965 Today's Date: 02/01/2020    History of Present Illness 54 y.o. female with medical diagnosis of COPD on 2 L O2, hypertension, depression/anxiety, hld, recent shingles infection, presents with chief concern of lethargy and poor PO intake. In ED was found to have hyponatremia. Admitted 01/28/20    PT Comments    Pt just returned from procedure for which she has been NPO since this morning. Pt agreeable to getting up to recliner to eat, however she is very confused to the time of day stating she doesn't understand why people keep trying to get her to eat all the time and that she jut ate. Pt requires maximal effort but is able to come to EoB and transfer to recliner. Pt limited in safe mobility by increasing frailness, with decreased strength and endurance. Once in chair educated that she is getting weaker in part because she is not eating enough. D/c plans remain appropriate. Pt's husband is extremely attentive and supportive, coaxing her to eat. PT will continue to follow acutely.    Follow Up Recommendations  Home health PT;Supervision/Assistance - 24 hour     Equipment Recommendations  3in1 (PT)    Recommendations for Other Services OT consult     Precautions / Restrictions Precautions Precautions: Other (comment) Precaution Comments: watch O2 Restrictions Weight Bearing Restrictions: No    Mobility  Bed Mobility Overal bed mobility: Needs Assistance Bed Mobility: Supine to Sit     Supine to sit: Min guard     General bed mobility comments: min guard for safety, requires increased cuing for getting feet onto the floor  Transfers Overall transfer level: Needs assistance Equipment used: None Transfers: Stand Pivot Transfers;Sit to/from Stand Sit to Stand: Min guard Stand pivot transfers: Min guard       General transfer comment: min guard for safety and min A for  managing lines and tubes   Ambulation/Gait             General Gait Details: pt too fatigued to attempt         Balance Overall balance assessment: Needs assistance Sitting-balance support: Feet unsupported;No upper extremity supported Sitting balance-Leahy Scale: Fair     Standing balance support: No upper extremity supported;During functional activity;Bilateral upper extremity supported Standing balance-Leahy Scale: Poor Standing balance comment: reaching for BSC armrest for support during pivot                            Cognition Arousal/Alertness: Awake/alert Behavior During Therapy: WFL for tasks assessed/performed;Flat affect Overall Cognitive Status: Impaired/Different from baseline Area of Impairment: Orientation;Memory;Awareness;Safety/judgement;Problem solving                 Orientation Level: Time   Memory: Decreased short-term memory   Safety/Judgement: Decreased awareness of deficits;Decreased awareness of safety Awareness: Emergent Problem Solving: Slow processing;Decreased initiation;Difficulty sequencing;Requires verbal cues;Requires tactile cues General Comments: Pt very confused about what time of day it is saying she just ate, but in fact she has been NPO for procedure since early this morning          General Comments General comments (skin integrity, edema, etc.): Pt on 2L O2 via [place] with SaO2 >94%O2 throughout session       Pertinent Vitals/Pain Pain Assessment: No/denies pain           PT Goals (current goals can now be found in the care plan section)  Acute Rehab PT Goals Patient Stated Goal: breathe better, feel better PT Goal Formulation: With patient/family Time For Goal Achievement: 02/13/20 Potential to Achieve Goals: Fair Progress towards PT goals: Not progressing toward goals - comment (increased fatigue )    Frequency    Min 3X/week      PT Plan Current plan remains appropriate       AM-PAC PT  "6 Clicks" Mobility   Outcome Measure  Help needed turning from your back to your side while in a flat bed without using bedrails?: None Help needed moving from lying on your back to sitting on the side of a flat bed without using bedrails?: A Little Help needed moving to and from a bed to a chair (including a wheelchair)?: None Help needed standing up from a chair using your arms (e.g., wheelchair or bedside chair)?: None Help needed to walk in hospital room?: A Little Help needed climbing 3-5 steps with a railing? : A Lot 6 Click Score: 20    End of Session Equipment Utilized During Treatment: Gait belt;Oxygen Activity Tolerance: Patient limited by fatigue;Other (comment) (weakness) Patient left: with call bell/phone within reach;with family/visitor present;in chair;with chair alarm set Nurse Communication: Mobility status PT Visit Diagnosis: Unsteadiness on feet (R26.81);Other abnormalities of gait and mobility (R26.89);Muscle weakness (generalized) (M62.81);Difficulty in walking, not elsewhere classified (R26.2);Adult, failure to thrive (R62.7)     Time: 1245-8099 PT Time Calculation (min) (ACUTE ONLY): 24 min  Charges:  $Therapeutic Activity: 23-37 mins                     Kathleen Cameron PT, DPT Acute Rehabilitation Services Pager [phone] Office [phone]    Kathleen Cameron 02/01/2020, 5:15 PM

## 2020-02-02 DIAGNOSIS — E871 Hypo-osmolality and hyponatremia: Secondary | ICD-10-CM | POA: Diagnosis not present

## 2020-02-02 DIAGNOSIS — G9341 Metabolic encephalopathy: Secondary | ICD-10-CM | POA: Diagnosis not present

## 2020-02-02 DIAGNOSIS — I10 Essential (primary) hypertension: Secondary | ICD-10-CM | POA: Diagnosis not present

## 2020-02-02 DIAGNOSIS — J449 Chronic obstructive pulmonary disease, unspecified: Secondary | ICD-10-CM | POA: Diagnosis not present

## 2020-02-02 LAB — BASIC METABOLIC PANEL
Anion gap: 10 (ref 5–15)
BUN: <5 mg/dL — ABNORMAL LOW (ref 6–20)
CO2: 39 mmol/L — ABNORMAL HIGH (ref 22–32)
Calcium: 8.7 mg/dL — ABNORMAL LOW (ref 8.9–10.3)
Chloride: 83 mmol/L — ABNORMAL LOW (ref 98–111)
Creatinine, Ser: 0.52 mg/dL (ref 0.44–1.00)
GFR, Estimated: >60 mL/min (ref >60)
Glucose, Bld: 132 mg/dL — ABNORMAL HIGH (ref 70–99)
Potassium: 3.8 mmol/L (ref 3.5–5.1)
Sodium: 132 mmol/L — ABNORMAL LOW (ref 135–145)

## 2020-02-02 MED ORDER — SODIUM CHLORIDE 0.9 % IV BOLUS
1000.0000 mL | Freq: Once | INTRAVENOUS | Status: AC
  Administered 2020-02-02: 1000 mL via INTRAVENOUS

## 2020-02-02 MED ORDER — POLYETHYLENE GLYCOL 3350 17 G PO PACK
17.0000 g | PACK | Freq: Two times a day (BID) | ORAL | Status: DC
  Administered 2020-02-02: 17 g via ORAL

## 2020-02-02 MED ORDER — LEVOTHYROXINE SODIUM 50 MCG PO TABS: 50.0000 ug | ORAL_TABLET | Freq: Every day | ORAL | 3 refills | Status: DC

## 2020-02-02 NOTE — Plan of Care (Signed)

## 2020-02-02 NOTE — TOC Initial Note (Addendum)
Transition of Care [facility]) - Initial/Assessment Note    Patient Details  Name: Kathleen Cameron MRN: [id number] Date of Birth: 1965/10/10  Transition of Care [facility]) CM/SW Contact:    Leone Haven, RN Phone Number: 02/02/2020, 10:18 AM  Clinical Narrative:                 Patient is for dc today, NCM offered choice for HHPT, she states she had [facility] before she would like them or can try someone else if they can not take referral.  Awaiting call back from [facility] with [facility] .  Denny Peon states she can not take referral.  NCM made referral to [place] with [facility] . Awaiting to hear back.  Expected Discharge Plan: Home w Home Health Services Barriers to Discharge: No Barriers Identified   Patient Goals and CMS Choice Patient states their goals for this hospitalization and ongoing recovery are:: go back to work [employer].gov Compare Post Acute Care list provided to:: Patient Choice offered to / list presented to : Patient  Expected Discharge Plan and Services Expected Discharge Plan: Home w Home Health Services In-house Referral: NA Discharge Planning Services: CM Consult Post Acute Care Choice: Home Health Living arrangements for the past 2 months: Apartment Expected Discharge Date: 02/02/20                 DME Agency: NA       HH Arranged: PT          Prior Living Arrangements/Services Living arrangements for the past 2 months: Apartment Lives with:: Spouse Patient language and need for interpreter reviewed:: Yes Do you feel safe going back to the place where you live?: Yes      Need for Family Participation in Patient Care: Yes (Comment) Care giver support system in place?: Yes (comment) Current home services: DME (walker, home oxygen) Criminal Activity/Legal Involvement Pertinent to Current Situation/Hospitalization: No - Comment as needed  Activities of Daily Living Home Assistive Devices/Equipment: Eyeglasses, Environmental consultant (specify type) ADL Screening (condition at  time of admission) Patient's cognitive ability adequate to safely complete daily activities?: No Is the patient deaf or have difficulty hearing?: No Does the patient have difficulty seeing, even when wearing glasses/contacts?: No Does the patient have difficulty concentrating, remembering, or making decisions?: Yes Patient able to express need for assistance with ADLs?: Yes Does the patient have difficulty dressing or bathing?: No Independently performs ADLs?: Yes (appropriate for developmental age) Does the patient have difficulty walking or climbing stairs?: Yes Weakness of Legs: Both Weakness of Arms/Hands: None  Permission Sought/Granted                  Emotional Assessment Appearance:: Appears stated age Attitude/Demeanor/Rapport: Engaged Affect (typically observed): Appropriate Orientation: : Oriented to Situation, Oriented to  Time, Oriented to Place, Oriented to Self Alcohol / Substance Use: Not Applicable Psych Involvement: No (comment)  Admission diagnosis:  Hyponatremia [E87.1] Chronic obstructive pulmonary disease, unspecified COPD type (HCC) [J44.9] Patient Active Problem List   Diagnosis Date Noted  . Right upper quadrant abdominal pain 02/01/2020  . Hypothyroidism (acquired) 02/01/2020  . Acute metabolic encephalopathy 01/29/2020  . Hyponatremia 01/28/2020  . Anxiety 12/02/2019  . COPD exacerbation (HCC) 11/06/2019  . Depression 11/06/2019  . Acute on chronic respiratory failure with hypoxia and hypercapnia (HCC) 11/06/2019  . Respiratory failure (HCC) 11/06/2019  . Current moderate episode of major depressive disorder without prior episode (HCC) 09/20/2019  . Pedal edema 08/04/2019  . Chronic respiratory failure with hypoxia (HCC)  03/07/2019  . CKD (chronic kidney disease) 01/05/2019  . COPD (chronic obstructive pulmonary disease) (HCC) 09/29/2018  . Pulmonary nodules 09/27/2018  . Dyslipidemia 07/07/2018  . Essential hypertension 03/10/2018   PCP:   [name], MD Pharmacy:   [facility] [phone] - [place], [place] - [address] AT [employer] [address] & [address] [place] [address] Phone: [phone] Fax: [phone]     Social Determinants of Health (SDOH) Interventions Food Insecurity Interventions: Intervention Not Indicated Transportation Interventions: Intervention Not Indicated  Readmission Risk Interventions Readmission Risk Prevention Plan 02/02/2020  Transportation Screening Complete  PCP or Specialist Appt within 3-5 Days Complete  HRI or Home Care Consult Complete  Social Work Consult for Recovery Care Planning/Counseling Complete  Palliative Care Screening Not Applicable  Medication Review Oceanographer) Complete  Some recent data might be hidden

## 2020-02-02 NOTE — TOC Transition Note (Addendum)
Transition of Care [facility]) - CM/SW Discharge Note   Patient Details  Name: Kathleen Cameron MRN: [id number] Date of Birth: December 18, 1965  Transition [facility] Care [facility]) CM/SW Contact:  Leone Haven, RN Phone Number: 02/02/2020, 1:07 PM   Clinical Narrative:    Patient is for dc today, NCM offered choice for HHPT, did not get any offers to take referral.  NCM informed patient and asked if she would like to do outpatient physical therapy, she states she has done it on [employer]. Before,  NCM sent referral to [facility] outpt pt .   14:48  NCM made referral to [facility] with [facility] , she states she will take this referral for HHPT. Soc will begin 24 to 48 hrs post dc.  NCM left message with patient on vm for her. NCM contacted outpatient physical therapy to cancel the follow up with them since she will have [facility] HHPT.      Final next level of care: Home/Self Care Barriers to Discharge: No Barriers Identified   Patient Goals and CMS Choice Patient states their goals for this hospitalization and ongoing recovery are:: get better CMS Medicare.gov Compare Post Acute Care list provided to:: Patient Choice offered to / list presented to : Patient  Discharge Placement                       Discharge Plan and Services In-house Referral: NA Discharge Planning Services: CM Consult Post Acute Care Choice: Home Health            DME Agency: NA       HH Arranged: NA          Social Determinants of Health (SDOH) Interventions Food Insecurity Interventions: Intervention Not Indicated Transportation Interventions: Intervention Not Indicated   Readmission Risk Interventions Readmission Risk Prevention Plan 02/02/2020  Transportation Screening Complete  PCP or Specialist Appt within 3-5 Days Complete  HRI or Home Care Consult Complete  Social Work Consult for Recovery Care Planning/Counseling Complete  Palliative Care Screening Not Applicable  Medication Review Furniture conservator/restorer) Complete  Some recent data might be hidden

## 2020-02-02 NOTE — Discharge Summary (Addendum)
Physician Discharge Summary  Kathleen Cameron JXB:[id number] DOB: November 13, 1965 DOA: 01/28/2020  PCP: [name], MD  Admit date: 01/28/2020 Discharge date: 02/02/2020  Admitted From: Home Disposition:  Home  Recommendations for Outpatient Follow-up:  1. Follow up with endocrinology as in 3 to 4 weeks 2. Please obtain basic metabolic panel CBC TSH and free T4.  Home Health:Yes Equipment/Devices:None  Discharge Condition:stable CODE STATUS:Full Diet recommendation: Heart Healthy  Brief/Interim Summary: 54 y.o. female past medical history significant for essential hypertension COPD comes EMS for chief complaint of excessive sleepiness and poor oral intake per husband he called EMS she had been excessively sleeping and with poor coordination and poor oral intake for the last 7 days prior to admission.  The head was negative in the ED showed old infarcts chest x-ray showed no acute findings  Discharge Diagnoses:  Principal Problem:   Hyponatremia Active Problems:   Essential hypertension   Chronic respiratory failure with hypoxia (HCC)   Acute metabolic encephalopathy   Right upper quadrant abdominal pain   Hypothyroidism (acquired)  Acute metabolic encephalopathy secondary to hypovolemic hyponatremia: Patient related dizziness upon standing pressure hydrochlorothiazide was held. She was started on IV normal saline her sodium improved. A TSH was checked which was significantly high along with a free T4, see below for further details.  Newly diagnosed hypothyroidism: Her TSH and free T4 will recheck any worse significantly abnormal she was started on Synthroid her cortisol level was good response to 21 at 5 AM in the morning. Probably her hypothyroidism is contributing to her hyponatremia. Follow-up with endocrinology as an outpatient, also the differential for hyponatremia will be hyperaldosteronism but her blood pressure is well controlled she responded to IV  fluids.  Essential hypertension: Hydrochlorothiazide was held as it was thought it was contributing to her hyponatremia. Given her blood pressure is well controlled on beta-blocker and Norvasc these were continued.  Sinus bradycardia: Now improved, her bisoprolol was restarted.  Asymmetric lower extremity edema: Checks x-ray showed no volume overload, lower extremity Doppler was negative for DVT.  Chronic respiratory failure with hypercarbia secondary to COPD: Appears to be compensated no signs of distress.  Hyperlipidemia: Excellent continue statins.  Mild abdominal pain: LFTs were within normal her albumin was 3.3, abdominal ultrasound showed no acute findings. Likely due to Lovenox shots.   Discharge Instructions  Discharge Instructions    Diet - low sodium heart healthy   Complete by: As directed    Increase activity slowly   Complete by: As directed      Allergies as of 02/02/2020      Reactions   Stiolto Respimat [tiotropium Bromide-olodaterol] Other (See Comments)   Severe headaches and rapid heartrate      Medication List    STOP taking these medications   hydrochlorothiazide 25 MG tablet Commonly known as: HYDRODIURIL     TAKE these medications   acetaminophen 500 MG tablet Commonly known as: TYLENOL Take 500 mg by mouth daily as needed for mild pain or headache.   albuterol 108 (90 Base) MCG/ACT inhaler Commonly known as: VENTOLIN HFA Inhale into the lungs every 6 (six) hours as needed for wheezing or shortness of breath.   ALPRAZolam 0.25 MG tablet Commonly known as: XANAX Take 1 tablet (0.25 mg total) by mouth 3 (three) times daily as needed for anxiety. What changed: reasons to take this   alum & mag hydroxide-simeth 200-200-20 MG/5ML suspension Commonly known as: MAALOX/MYLANTA Take 15 mLs by mouth every 6 (six) hours as needed  for indigestion or heartburn.   amLODipine 5 MG tablet Commonly known as: NORVASC Take 1 tablet (5 mg total) by  mouth daily.   aspirin 81 MG EC tablet Take 1 tablet (81 mg total) by mouth daily.   bisoprolol 10 MG tablet Commonly known as: ZEBETA Take 1 tablet (10 mg total) by mouth daily.   Breztri Aerosphere 160-9-4.8 MCG/ACT Aero Generic drug: Budeson-Glycopyrrol-Formoterol Inhale 2 puffs into the lungs in the morning and at bedtime. What changed:   how much to take  when to take this  additional instructions   escitalopram 20 MG tablet Commonly known as: Lexapro Take 1 tablet (20 mg total) by mouth daily. What changed: Another medication with the same name was removed. Continue taking this medication, and follow the directions you see here.   famotidine 10 MG tablet Commonly known as: PEPCID Take 10 mg by mouth 2 (two) times daily as needed for heartburn or indigestion (acid reflux).   HYDROcodone-acetaminophen 5-325 MG tablet Commonly known as: Norco Take 1 tablet by mouth every 6 (six) hours as needed.   ipratropium-albuterol 0.5-2.5 (3) MG/3ML Soln Commonly known as: DUONEB Take 3 mLs by nebulization every 6 (six) hours as needed. What changed: reasons to take this   levothyroxine 50 MCG tablet Commonly known as: SYNTHROID Take 1 tablet (50 mcg total) by mouth daily at 6 (six) AM. Start taking on: February 03, 2020   Mucinex DM Maximum Strength 60-1200 MG Tb12 Take 1 tablet by mouth 2 (two) times daily as needed (congestion).   nitroGLYCERIN 0.4 MG SL tablet Commonly known as: NITROSTAT PLACE 1 TABLET(0.4 MG TOTAL) UNDER THE TONGUE EVERY 5 MINUTES AS NEEDED FOR CHEST PAIN What changed: See the new instructions.   OXYGEN Inhale 2 L into the lungs continuous.   pantoprazole 40 MG tablet Commonly known as: PROTONIX Take 1 tablet (40 mg total) by mouth daily.   polyethylene glycol 17 g packet Commonly known as: MIRALAX / GLYCOLAX Take 17 g by mouth daily as needed (constipation).   prednisoLONE acetate 1 % ophthalmic suspension Commonly known as: PRED  FORTE Place 1 drop into the left eye 4 (four) times daily.   rosuvastatin 40 MG tablet Commonly known as: CRESTOR Take 1 tablet (40 mg total) by mouth daily. What changed: Another medication with the same name was removed. Continue taking this medication, and follow the directions you see here.   valACYclovir 1000 MG tablet Commonly known as: VALTREX Take 1,000 mg by mouth daily.       Follow-up Information    Carlus Pavlov, MD Follow up.   Specialty: Internal Medicine Why: Call for appointment Contact information: [address] [place] [address] [phone]        [name], MD. Go on 02/09/2020.   Specialty: Internal Medicine Why: :00am Contact information: [address] [place] [address] [phone]              Allergies  Allergen Reactions  . Stiolto Respimat [Tiotropium Bromide-Olodaterol] Other (See Comments)    Severe headaches and rapid heartrate    Consultations:  None   Procedures/Studies: DG Chest 1 View  Result Date: 02/01/2020 CLINICAL DATA:  Dyspnea.  Chest pain. EXAM: CHEST  1 VIEW COMPARISON:  January 28, 2020. FINDINGS: The heart size and mediastinal contours are within normal limits. Left lung is clear. Mild right middle lobe subsegmental atelectasis or infiltrate is noted. No pneumothorax or pleural effusion is noted. The visualized skeletal structures are  unremarkable. IMPRESSION: Mild right middle lobe subsegmental atelectasis or infiltrate. Electronically Signed   By: Lupita Raider M.D.   On: 02/01/2020 10:41   DG Abd 1 View  Result Date: 02/01/2020 CLINICAL DATA:  Abdominal pain. EXAM: ABDOMEN - 1 VIEW COMPARISON:  None. FINDINGS: No abnormal bowel dilatation is noted. Moderate amount of stool is seen throughout the colon. Phleboliths are noted in the pelvis. IMPRESSION: Moderate stool burden. No evidence of bowel obstruction or ileus. Electronically Signed   By: Lupita Raider M.D.    On: 02/01/2020 10:42   CT Head Wo Contrast  Result Date: 01/28/2020 CLINICAL DATA:  Transient ischemic attack, migraine headache EXAM: CT HEAD WITHOUT CONTRAST TECHNIQUE: Contiguous axial images were obtained from the base of the skull through the vertex without intravenous contrast. COMPARISON:  None. FINDINGS: Brain: The examination is slightly limited by motion artifact. Remote high right parietal infarct is noted. No evidence of acute intracranial hemorrhage or infarct. No abnormal mass effect or midline shift. No abnormal intra or extra-axial mass lesion or fluid collection. Ventricular size is normal. Cerebellum is unremarkable. Vascular: No asymmetric hyperdense vasculature at the skull base. Skull: Intact Sinuses/Orbits: The paranasal sinuses are clear. The ovaries are unremarkable. Other: Mastoid air cells and middle ear cavities are clear. IMPRESSION: Remote right parietal infarct. No evidence of acute intracranial hemorrhage or infarct. Electronically Signed   By: Helyn Numbers MD   On: 01/28/2020 18:57   US Abdomen Complete  Result Date: 02/01/2020 CLINICAL DATA:  Right upper quadrant pain for several hours EXAM: ABDOMEN ULTRASOUND COMPLETE COMPARISON:  None. FINDINGS: Gallbladder: Gallbladder is well distended without wall thickening. Gallbladder sludge is noted. No definitive gallstones are seen. Common bile duct: Diameter: 3 mm. Liver: No focal lesion identified. Within normal limits in parenchymal echogenicity. Portal vein is patent on color Doppler imaging with normal direction of blood flow towards the liver. IVC: No abnormality visualized. Pancreas: Not well visualized Spleen: Size and appearance within normal limits. Right Kidney: Length: 10.1 cm. Echogenicity within normal limits. No mass or hydronephrosis visualized. Left Kidney: Length: 8.5 cm. Echogenicity within normal limits. No mass or hydronephrosis visualized. Abdominal aorta: No aneurysm visualized. Other findings: None.  IMPRESSION: Gallbladder sludge.  No other focal abnormality is noted. Electronically Signed   By: Alcide Clever M.D.   On: 02/01/2020 17:44   DG Chest Port 1 View  Result Date: 01/28/2020 CLINICAL DATA:  Altered mental status EXAM: PORTABLE CHEST 1 VIEW COMPARISON:  Chest radiograph November 09, 2019 FINDINGS: Multiple monitoring leads overlie the patient. Stable cardiac and mediastinal contours. Tortuosity of the thoracic aorta. No large area pulmonary consolidation. No pleural effusion or pneumothorax. Thoracic spine degenerative changes. IMPRESSION: No active disease. Electronically Signed   By: Annia Belt M.D.   On: 01/28/2020 16:22   VAS [place] LOWER EXTREMITY VENOUS (DVT)  Result Date: 01/30/2020  Lower Venous DVTStudy Indications: Edema, Swelling, and + Covid.  Performing Technologist: Jannet Askew RCT RDMS  Examination Guidelines: A complete evaluation includes B-mode imaging, spectral Doppler, color Doppler, and power Doppler as needed of all accessible portions of each vessel. Bilateral testing is considered an integral part of a complete examination. Limited examinations for reoccurring indications may be performed as noted. The reflux portion of the exam is performed with the patient in reverse Trendelenburg.  +---------+---------------+---------+-----------+----------+--------------+ RIGHT    CompressibilityPhasicitySpontaneityPropertiesThrombus Aging +---------+---------------+---------+-----------+----------+--------------+ CFV      Full           Yes  Yes                                 +---------+---------------+---------+-----------+----------+--------------+ SFJ      Full                                                        +---------+---------------+---------+-----------+----------+--------------+ FV Prox  Full                                                        +---------+---------------+---------+-----------+----------+--------------+ FV Mid   Full                                                         +---------+---------------+---------+-----------+----------+--------------+ FV DistalFull                                                        +---------+---------------+---------+-----------+----------+--------------+ PFV      Full                                                        +---------+---------------+---------+-----------+----------+--------------+ POP      Full           Yes      Yes                                 +---------+---------------+---------+-----------+----------+--------------+ PTV      Full                                                        +---------+---------------+---------+-----------+----------+--------------+ PERO     Full                                                        +---------+---------------+---------+-----------+----------+--------------+   +---------+---------------+---------+-----------+----------+--------------+ LEFT     CompressibilityPhasicitySpontaneityPropertiesThrombus Aging +---------+---------------+---------+-----------+----------+--------------+ CFV      Full           Yes      Yes                                 +---------+---------------+---------+-----------+----------+--------------+ SFJ      Full                                                        +---------+---------------+---------+-----------+----------+--------------+  FV Prox  Full                                                        +---------+---------------+---------+-----------+----------+--------------+ FV Mid   Full                                                        +---------+---------------+---------+-----------+----------+--------------+ FV DistalFull                                                        +---------+---------------+---------+-----------+----------+--------------+ PFV      Full                                                         +---------+---------------+---------+-----------+----------+--------------+ POP      Full           Yes      Yes                                 +---------+---------------+---------+-----------+----------+--------------+ PTV      Full                                                        +---------+---------------+---------+-----------+----------+--------------+ PERO     Full                                                        +---------+---------------+---------+-----------+----------+--------------+     Summary: RIGHT: - There is no evidence of deep vein thrombosis in the lower extremity.  - No cystic structure found in the popliteal fossa.  LEFT: - There is no evidence of deep vein thrombosis in the lower extremity.  - No cystic structure found in the popliteal fossa.  *See table(s) above for measurements and observations. Electronically signed by Sherald Hesshristopher Clark MD on 01/30/2020 at 5:21:21 PM.    Final     Subjective: No new complaints.  Discharge Exam: Vitals:   02/02/20 0007 02/02/20 0358  BP: (!) 146/89 (!) 140/97  Pulse: 66 75  Resp: 20 (!) 23  Temp: 97.6 F (36.4 C) 98 F (36.7 C)  SpO2: 99% 100%   Vitals:   02/01/20 1604 02/01/20 1946 02/02/20 0007 02/02/20 0358  BP:  (!) 138/99 (!) 146/89 (!) 140/97  Pulse:  69 66 75  Resp:   20 (!) 23  Temp: 97.7 F (36.5 C) 97.8 F (36.6 C) 97.6 F (36.4 C) 98 F (  36.7 C)  TempSrc: Oral Oral Oral Oral  SpO2:  93% 99% 100%  Weight:    52.2 kg  Height:        General: Pt is alert, awake, not in acute distress Cardiovascular: RRR, S1/S2 +, no rubs, no gallops Respiratory: CTA bilaterally, no wheezing, no rhonchi Abdominal: Soft, NT, ND, bowel sounds + Extremities: no edema, no cyanosis    The results of significant diagnostics from this hospitalization (including imaging, microbiology, ancillary and laboratory) are listed below for reference.     Microbiology: Recent Results (from the past 240  hour(s))  Respiratory Panel by RT PCR (Flu A&B, Covid) -     Status: None   Collection Time: 01/28/20  3:13 PM   Specimen: Nasopharyngeal  Result Value Ref Range Status   SARS Coronavirus 2 by RT PCR NEGATIVE NEGATIVE Final    Comment: (NOTE) SARS-CoV-2 target nucleic acids are NOT DETECTED.  The SARS-CoV-2 RNA is generally detectable in upper respiratoy specimens during the acute phase of infection. The lowest concentration of SARS-CoV-2 viral copies this assay can detect is 131 copies/mL. A negative result does not preclude SARS-Cov-2 infection and should not be used as the sole basis for treatment or other patient management decisions. A negative result may occur with  improper specimen collection/handling, submission of specimen other than nasopharyngeal swab, presence of viral mutation(s) within the areas targeted by this assay, and inadequate number of viral copies (<131 copies/mL). A negative result must be combined with clinical observations, patient history, and epidemiological information. The expected result is Negative.  Fact Sheet for Patients:  https://www.moore.com/  Fact Sheet for Healthcare Providers:  https://www.young.biz/  This test is no t yet approved or cleared by the [place] FDA and  has been authorized for detection and/or diagnosis of SARS-CoV-2 by FDA under an Emergency Use Authorization (EUA). This EUA will remain  in effect (meaning this test can be used) for the duration of the COVID-19 declaration under Section 564(b)(1) of the Act, 21 U.S.C. section 360bbb-3(b)(1), unless the authorization is terminated or revoked sooner.     Influenza A by PCR NEGATIVE NEGATIVE Final   Influenza B by PCR NEGATIVE NEGATIVE Final    Comment: (NOTE) The Xpert Xpress SARS-CoV-2/FLU/RSV assay is intended as an aid in  the diagnosis of influenza from Nasopharyngeal swab specimens and  should not be used as a sole basis  for treatment. Nasal washings and  aspirates are unacceptable for Xpert Xpress SARS-CoV-2/FLU/RSV  testing.  Fact Sheet for Patients: https://www.moore.com/  Fact Sheet for Healthcare Providers: https://www.young.biz/  This test is not yet approved or cleared by the [place] FDA and  has been authorized for detection and/or diagnosis of SARS-CoV-2 by  FDA under an Emergency Use Authorization (EUA). This EUA will remain  in effect (meaning this test can be used) for the duration of the  Covid-19 declaration under Section 564(b)(1) of the Act, 21  U.S.C. section 360bbb-3(b)(1), unless the authorization is  terminated or revoked. Performed at [facility], [address], [place], [place] [address]      Labs: BNP (last 3 results) Recent Labs    07/23/19 2346 09/06/19 1414 01/28/20 1503  BNP 16.9 32.2 106.8*   Basic Metabolic Panel: Recent Labs  Lab 01/28/20 2126 01/29/20 0607 01/30/20 2138 01/31/20 1119 01/31/20 2241 02/01/20 1119 02/01/20 2152  NA 122*   < > 128* 127* 129* 127* 129*  K 3.8   < > 5.2* 4.1 4.3 3.2* 3.8  CL 70*   < > 83* 82* 83* 78* 80*  CO2 41*   < > 35* 38* 38* 40* 42*  GLUCOSE 101*   < > 74 139* 116* 94 89  BUN 9   < > 21* 17 15 8 7   CREATININE 0.73   < > 0.70 0.66 0.67 0.59 0.63  CALCIUM 9.4   < > 8.8* 8.9 9.0 8.6* 8.7*  MG 1.5*  --   --   --   --   --   --    < > = values in this interval not displayed.   Liver Function Tests: Recent Labs  Lab 01/28/20 1503 02/01/20 1119  AST 34 34  ALT 17 15  ALKPHOS 51 47  BILITOT 0.6 0.3  PROT 7.6 6.6  ALBUMIN 3.8 3.3*   No results for input(s): LIPASE, AMYLASE in the last 168 hours. No results for input(s): AMMONIA in the last 168 hours. CBC: Recent Labs  Lab 01/28/20 1503 01/28/20 1645 01/29/20 0607 02/01/20 1119  WBC 6.5  --  6.5 5.0  NEUTROABS 5.1  --   --  3.8  HGB 10.9* 12.2 12.2 10.1*  HCT 34.2* 36.0 37.4 31.2*  MCV 90.2  --  88.0  89.4  PLT 265  --  289 266   Cardiac Enzymes: No results for input(s): CKTOTAL, CKMB, CKMBINDEX, TROPONINI in the last 168 hours. BNP: Invalid input(s): POCBNP CBG: No results for input(s): GLUCAP in the last 168 hours. D-Dimer No results for input(s): DDIMER in the last 72 hours. Hgb A1c No results for input(s): HGBA1C in the last 72 hours. Lipid Profile No results for input(s): CHOL, HDL, LDLCALC, TRIG, CHOLHDL, LDLDIRECT in the last 72 hours. Thyroid function studies Recent Labs    01/31/20 1119  TSH 28.545*   Anemia work up No results for input(s): VITAMINB12, FOLATE, FERRITIN, TIBC, IRON, RETICCTPCT in the last 72 hours. Urinalysis    Component Value Date/Time   COLORURINE YELLOW 01/28/2020 1503   APPEARANCEUR CLEAR 01/28/2020 1503   LABSPEC 1.015 01/28/2020 1503   PHURINE 6.0 01/28/2020 1503   GLUCOSEU NEGATIVE 01/28/2020 1503   HGBUR NEGATIVE 01/28/2020 1503   BILIRUBINUR NEGATIVE 01/28/2020 1503   KETONESUR NEGATIVE 01/28/2020 1503   PROTEINUR 30 (A) 01/28/2020 1503   UROBILINOGEN 1.0 11/12/2008 1440   NITRITE NEGATIVE 01/28/2020 1503   LEUKOCYTESUR NEGATIVE 01/28/2020 1503   Sepsis Labs Invalid input(s): PROCALCITONIN,  WBC,  LACTICIDVEN Microbiology Recent Results (from the past 240 hour(s))  Respiratory Panel by RT PCR (Flu A&B, Covid) -     Status: None   Collection Time: 01/28/20  3:13 PM   Specimen: Nasopharyngeal  Result Value Ref Range Status   SARS Coronavirus 2 by RT PCR NEGATIVE NEGATIVE Final    Comment: (NOTE) SARS-CoV-2 target nucleic acids are NOT DETECTED.  The SARS-CoV-2 RNA is generally detectable in upper respiratoy specimens during the acute phase of infection. The lowest concentration of SARS-CoV-2 viral copies this assay can detect is 131 copies/mL. A negative result does not preclude SARS-Cov-2 infection and should not be used as the sole basis for treatment or other patient management decisions. A negative result may occur with   improper specimen collection/handling, submission of specimen other than nasopharyngeal swab, presence of viral mutation(s) within the areas targeted by this assay, and inadequate number of viral copies (<131 copies/mL). A negative result must be combined with clinical observations, patient history, and epidemiological information. The expected result is Negative.  Fact Sheet for Patients:  https://www.moore.com/  Fact Sheet for Healthcare Providers:  https://www.young.biz/  This test is no t yet approved or cleared by the [place] FDA and  has been authorized for detection and/or diagnosis of SARS-CoV-2 by FDA under an Emergency Use Authorization (EUA). This EUA will remain  in effect (meaning this test can be used) for the duration of the COVID-19 declaration under Section 564(b)(1) of the Act, 21 U.S.C. section 360bbb-3(b)(1), unless the authorization is terminated or revoked sooner.     Influenza A by PCR NEGATIVE NEGATIVE Final   Influenza B by PCR NEGATIVE NEGATIVE Final    Comment: (NOTE) The Xpert Xpress SARS-CoV-2/FLU/RSV assay is intended as an aid in  the diagnosis of influenza from Nasopharyngeal swab specimens and  should not be used as a sole basis for treatment. Nasal washings and  aspirates are unacceptable for Xpert Xpress SARS-CoV-2/FLU/RSV  testing.  Fact Sheet for Patients: https://www.moore.com/  Fact Sheet for Healthcare Providers: https://www.young.biz/  This test is not yet approved or cleared by the [place] FDA and  has been authorized for detection and/or diagnosis of SARS-CoV-2 by  FDA under an Emergency Use Authorization (EUA). This EUA will remain  in effect (meaning this test can be used) for the duration of the  Covid-19 declaration under Section 564(b)(1) of the Act, 21  U.S.C. section 360bbb-3(b)(1), unless the authorization is  terminated or  revoked. Performed at [facility], [address], [place], [place] [address]      Time coordinating discharge: Over 30 minutes  SIGNED:   Marinda Elk, MD  [facility] 02/02/2020, 11:30 AM Pager   If 7PM-7AM, please contact night-coverage [email or web address] Password [email or web address]

## 2020-02-02 NOTE — Progress Notes (Signed)
D/C instructions given and reviewed. Questions asked and answered but encouraged to call with any concerns.

## 2020-02-03 ENCOUNTER — Encounter: Payer: Self-pay | Admitting: Emergency Medicine

## 2020-02-07 ENCOUNTER — Other Ambulatory Visit: Payer: Self-pay

## 2020-02-07 ENCOUNTER — Encounter: Payer: PRIVATE HEALTH INSURANCE | Admitting: Emergency Medicine

## 2020-02-08 ENCOUNTER — Other Ambulatory Visit: Payer: Self-pay

## 2020-02-08 ENCOUNTER — Encounter: Payer: Self-pay | Admitting: Emergency Medicine

## 2020-02-08 ENCOUNTER — Telehealth (INDEPENDENT_AMBULATORY_CARE_PROVIDER_SITE_OTHER): Payer: PRIVATE HEALTH INSURANCE | Admitting: Emergency Medicine

## 2020-02-08 VITALS — Ht 64.0 in | Wt 117.0 lb

## 2020-02-08 DIAGNOSIS — E871 Hypo-osmolality and hyponatremia: Secondary | ICD-10-CM

## 2020-02-08 DIAGNOSIS — J9611 Chronic respiratory failure with hypoxia: Secondary | ICD-10-CM

## 2020-02-08 DIAGNOSIS — I10 Essential (primary) hypertension: Secondary | ICD-10-CM

## 2020-02-08 DIAGNOSIS — N1832 Chronic kidney disease, stage 3b: Secondary | ICD-10-CM

## 2020-02-08 DIAGNOSIS — E039 Hypothyroidism, unspecified: Secondary | ICD-10-CM

## 2020-02-08 DIAGNOSIS — Z09 Encounter for follow-up examination after completed treatment for conditions other than malignant neoplasm: Secondary | ICD-10-CM

## 2020-02-08 DIAGNOSIS — J438 Other emphysema: Secondary | ICD-10-CM

## 2020-02-08 NOTE — Progress Notes (Signed)
Telemedicine Encounter- SOAP NOTE Established Patient MyChart video conference This video telephone encounter was conducted with the patient's (or proxy's) verbal consent via video audio telecommunications: yes/no: Yes Patient was instructed to have this encounter in a suitably private space; and to only have persons present to whom they give permission to participate. In addition, patient identity was confirmed by use of name plus two identifiers (DOB and address).  I discussed the limitations, risks, security and privacy concerns of performing an evaluation and management service by telephone and the availability of in person appointments. I also discussed with the patient that there may be a patient responsible charge related to this service. The patient expressed understanding and agreed to proceed.  I spent a total of TIME; 0 MIN TO 60 MIN: 20 minutes talking with the patient or their proxy.  Chief Complaint  Patient presents with  . Hospitalization Follow-up    f/u    Physician Discharge Summary  Kathleen Cameron ZOX:[id number] DOB: 09/03/1965 DOA: 01/28/2020  PCP: Georgina QuintSagardia, Ayson Cherubini Jose, MD  Admit date: 01/28/2020 Discharge date: 02/02/2020  Admitted From: Home Disposition:  Home  Recommendations for Outpatient Follow-up:  1. Follow up with endocrinology as in 3 to 4 weeks 2. Please obtain basic metabolic panel CBC TSH and free T4.  Home Health:Yes Equipment/Devices:None  Discharge Condition:stable CODE STATUS:Full Diet recommendation: Heart Healthy  Brief/Interim Summary: 54 y.o.femalepast medical history significant for essential hypertension COPD comes EMS for chief complaint of excessive sleepiness and poor oral intake per husband he called EMS she had been excessively sleeping and with poor coordination and poor oral intake for the last 7 days prior to admission. The head was negative in the ED showed old infarcts chest x-ray showed no acute  findings  Discharge Diagnoses:  Principal Problem:   Hyponatremia Active Problems:   Essential hypertension   Chronic respiratory failure with hypoxia (HCC)   Acute metabolic encephalopathy   Right upper quadrant abdominal pain   Hypothyroidism (acquired)  Acute metabolic encephalopathy secondary to hypovolemic hyponatremia: Patient related dizziness upon standing pressure hydrochlorothiazide was held. She was started on IV normal saline her sodium improved. A TSH was checked which was significantly high along with a free T4, see below for further details.  Newly diagnosed hypothyroidism: Her TSH and free T4 will recheck any worse significantly abnormal she was started on Synthroid her cortisol level was good response to 21 at 5 AM in the morning. Probably her hypothyroidism is contributing to her hyponatremia. Follow-up with endocrinology as an outpatient, also the differential for hyponatremia will be hyperaldosteronism but her blood pressure is well controlled she responded to IV fluids.  Essential hypertension: Hydrochlorothiazide was held as it was thought it was contributing to her hyponatremia. Given her blood pressure is well controlled on beta-blocker and Norvasc these were continued.  Sinus bradycardia: Now improved, her bisoprolol was restarted.  Asymmetric lower extremity edema: Checks x-ray showed no volume overload, lower extremity Doppler was negative for DVT.  Chronic respiratory failure with hypercarbia secondary to COPD: Appears to be compensated no signs of distress.  Hyperlipidemia: Excellent continue statins.  Mild abdominal pain: LFTs were within normal her albumin was 3.3, abdominal ultrasound showed no acute findings. Likely due to Lovenox shots.   Subjective   Kathleen Cameron is a 54 y.o. female established patient. Telephone visit today for hospital discharge follow-up.  Admitted on 01/28/2020 and discharged on 02/02/2020.  Discharge  summary reviewed. Feeling better.  Has no complaints or medical concerns today.  Husband helping with history.  HPI   Patient Active Problem List   Diagnosis Date Noted  . Right upper quadrant abdominal pain 02/01/2020  . Hypothyroidism (acquired) 02/01/2020  . Acute metabolic encephalopathy 01/29/2020  . Hyponatremia 01/28/2020  . Anxiety 12/02/2019  . COPD exacerbation (HCC) 11/06/2019  . Depression 11/06/2019  . Acute on chronic respiratory failure with hypoxia and hypercapnia (HCC) 11/06/2019  . Respiratory failure (HCC) 11/06/2019  . Current moderate episode of major depressive disorder without prior episode (HCC) 09/20/2019  . Pedal edema 08/04/2019  . Chronic respiratory failure with hypoxia (HCC) 03/07/2019  . CKD (chronic kidney disease) 01/05/2019  . COPD (chronic obstructive pulmonary disease) (HCC) 09/29/2018  . Pulmonary nodules 09/27/2018  . Dyslipidemia 07/07/2018  . Essential hypertension 03/10/2018    Past Medical History:  Diagnosis Date  . Asthma   . COPD (chronic obstructive pulmonary disease) (HCC)   . Hypertension     Current Outpatient Medications  Medication Sig Dispense Refill  . acetaminophen (TYLENOL) 500 MG tablet Take 500 mg by mouth daily as needed for mild pain or headache.    . albuterol (VENTOLIN HFA) 108 (90 Base) MCG/ACT inhaler Inhale into the lungs every 6 (six) hours as needed for wheezing or shortness of breath.    . ALPRAZolam (XANAX) 0.25 MG tablet Take 1 tablet (0.25 mg total) by mouth 3 (three) times daily as needed for anxiety. (Patient taking differently: Take 0.25 mg by mouth 3 (three) times daily as needed for anxiety (shingles pain). ) 30 tablet 1  . alum & mag hydroxide-simeth (MAALOX/MYLANTA) 200-200-20 MG/5ML suspension Take 15 mLs by mouth every 6 (six) hours as needed for indigestion or heartburn. 355 mL 0  . amLODipine (NORVASC) 5 MG tablet Take 1 tablet (5 mg total) by mouth daily. 90 tablet 3  . aspirin EC 81 MG EC  tablet Take 1 tablet (81 mg total) by mouth daily. 30 tablet 0  . bisoprolol (ZEBETA) 10 MG tablet Take 1 tablet (10 mg total) by mouth daily. 30 tablet 3  . Budeson-Glycopyrrol-Formoterol (BREZTRI AEROSPHERE) 160-9-4.8 MCG/ACT AERO Inhale 2 puffs into the lungs in the morning and at bedtime. (Patient taking differently: Inhale 1 puff into the lungs See admin instructions. Inhale 1 puff into the lungs when walking to bathroom - 6-7 times daily) 10.7 g 0  . Dextromethorphan-guaiFENesin (MUCINEX DM MAXIMUM STRENGTH) 60-1200 MG TB12 Take 1 tablet by mouth 2 (two) times daily as needed (congestion).    [name] escitalopram (LEXAPRO) 20 MG tablet Take 1 tablet (20 mg total) by mouth daily. 90 tablet 1  . famotidine (PEPCID) 10 MG tablet Take 10 mg by mouth 2 (two) times daily as needed for heartburn or indigestion (acid reflux).    [name] levothyroxine (SYNTHROID) 50 MCG tablet Take 1 tablet (50 mcg total) by mouth daily at 6 (six) AM. 30 tablet 3  . nitroGLYCERIN (NITROSTAT) 0.4 MG SL tablet PLACE 1 TABLET(0.4 MG TOTAL) UNDER THE TONGUE EVERY 5 MINUTES AS NEEDED FOR CHEST PAIN (Patient taking differently: Place 0.4 mg under the tongue every 5 (five) minutes as needed for chest pain. ) 25 tablet 0  . OXYGEN Inhale 2 L into the lungs continuous.    . pantoprazole (PROTONIX) 40 MG tablet Take 1 tablet (40 mg total) by mouth daily. 30 tablet 3  . polyethylene glycol (MIRALAX / GLYCOLAX) 17 g packet Take 17 g by mouth daily as needed (constipation).    . prednisoLONE acetate (PRED FORTE) 1 % ophthalmic suspension Place  1 drop into the left eye 4 (four) times daily.    . rosuvastatin (CRESTOR) 40 MG tablet Take 1 tablet (40 mg total) by mouth daily. 90 tablet 3  . valACYclovir (VALTREX) 1000 MG tablet Take 1,000 mg by mouth daily.    [name] HYDROcodone-acetaminophen (NORCO) 5-325 MG tablet Take 1 tablet by mouth every 6 (six) hours as needed. (Patient not taking: Reported on 02/08/2020) 12 tablet 0  . ipratropium-albuterol  (DUONEB) 0.5-2.5 (3) MG/3ML SOLN Take 3 mLs by nebulization every 6 (six) hours as needed. (Patient not taking: Reported on 02/08/2020) 360 mL 2   No current facility-administered medications for this visit.    Allergies  Allergen Reactions  . Stiolto Respimat [Tiotropium Bromide-Olodaterol] Other (See Comments)    Severe headaches and rapid heartrate    Social History   Socioeconomic History  . Marital status: Married    Spouse name: Not on file  . Number of children: Not on file  . Years of education: Not on file  . Highest education level: Not on file  Occupational History  . Not on file  Tobacco Use  . Smoking status: Never Smoker  . Smokeless tobacco: Never Used  Vaping Use  . Vaping Use: Never used  Substance and Sexual Activity  . Alcohol use: Yes    Alcohol/week: 2.0 standard drinks    Types: 2 Cans of beer per week  . Drug use: Never  . Sexual activity: Not on file  Other Topics Concern  . Not on file  Social History Narrative  . Not on file   Social Determinants of Health   Financial Resource Strain:   . Difficulty of Paying Living Expenses: Not on file  Food Insecurity: No Food Insecurity  . Worried About Programme researcher, broadcasting/film/video in the Last Year: Never true  . Ran Out of Food in the Last Year: Never true  Transportation Needs: No Transportation Needs  . Lack of Transportation (Medical): No  . Lack of Transportation (Non-Medical): No  Physical Activity:   . Days of Exercise per Week: Not on file  . Minutes of Exercise per Session: Not on file  Stress:   . Feeling of Stress : Not on file  Social Connections:   . Frequency of Communication with Friends and Family: Not on file  . Frequency of Social Gatherings with Friends and Family: Not on file  . Attends Religious Services: Not on file  . Active Member of Clubs or Organizations: Not on file  . Attends Banker Meetings: Not on file  . Marital Status: Not on file  Intimate Partner Violence:    . Fear of Current or Ex-Partner: Not on file  . Emotionally Abused: Not on file  . Physically Abused: Not on file  . Sexually Abused: Not on file    Review of Systems  Constitutional: Negative.  Negative for chills and fever.  HENT: Negative.  Negative for congestion and sore throat.   Respiratory: Negative for cough.   Cardiovascular: Negative.  Negative for chest pain.  Gastrointestinal: Negative.  Negative for abdominal pain, diarrhea, nausea and vomiting.  Genitourinary: Negative for dysuria.  Skin: Negative.  Negative for rash.  Neurological: Negative for dizziness and headaches.  All other systems reviewed and are negative.   Objective  Alert and oriented x3 in no apparent respiratory distress. Vitals as reported by the patient: Today's Vitals   02/08/20 1604  Weight: 117 lb (53.1 kg)  Height: 5\' 4"  (1.626 m)  There are no diagnoses linked to this encounter. Takira was seen today for hospitalization follow-up.  Diagnoses and all orders for this visit:  Hospital discharge follow-up  Chronic respiratory failure with hypoxia (HCC)  Hypothyroidism (acquired)  Hyponatremia  Stage 3b chronic kidney disease (HCC)  Other emphysema (HCC)  Essential hypertension     I discussed the assessment and treatment plan with the patient. The patient was provided an opportunity to ask questions and all were answered. The patient agreed with the plan and demonstrated an understanding of the instructions.   The patient was advised to call back or seek an in-person evaluation if the symptoms worsen or if the condition fails to improve as anticipated.  I provided 20 minutes of non-face-to-face time during this encounter.  [name], MD  [facility] at [facility]

## 2020-02-08 NOTE — Patient Instructions (Signed)
° ° ° °  If you have lab work done today you will be contacted with your lab results within the next 2 weeks.  If you have not heard from us then please contact us. The fastest way to get your results is to register for My Chart. ° ° °IF you received an x-ray today, you will receive an invoice from [facility]. Please contact  Radiology at [phone] with questions or concerns regarding your invoice.  ° °IF you received labwork today, you will receive an invoice from [facility]. Please contact [facility] at [phone] with questions or concerns regarding your invoice.  ° °Our billing staff will not be able to assist you with questions regarding bills from these companies. ° °You will be contacted with the lab results as soon as they are available. The fastest way to get your results is to activate your My Chart account. Instructions are located on the last page of this paperwork. If you have not heard from us regarding the results in 2 weeks, please contact this office. °  ° ° ° °

## 2020-02-09 ENCOUNTER — Inpatient Hospital Stay: Payer: PRIVATE HEALTH INSURANCE | Admitting: Emergency Medicine

## 2020-02-10 ENCOUNTER — Encounter: Payer: Self-pay | Admitting: Emergency Medicine

## 2020-02-11 ENCOUNTER — Encounter: Payer: Self-pay | Admitting: Emergency Medicine

## 2020-02-16 ENCOUNTER — Ambulatory Visit: Payer: Commercial Managed Care - PPO | Admitting: Emergency Medicine

## 2020-02-25 ENCOUNTER — Emergency Department (HOSPITAL_COMMUNITY): Payer: PRIVATE HEALTH INSURANCE

## 2020-02-25 ENCOUNTER — Inpatient Hospital Stay (HOSPITAL_COMMUNITY)
Admission: EM | Admit: 2020-02-25 | Discharge: 2020-03-08 | DRG: 004 | Disposition: A | Discharge status: Other Acute Inpatient Hospital | Payer: PRIVATE HEALTH INSURANCE | Attending: Internal Medicine | Admitting: Internal Medicine

## 2020-02-25 ENCOUNTER — Inpatient Hospital Stay (HOSPITAL_COMMUNITY): Payer: PRIVATE HEALTH INSURANCE

## 2020-02-25 DIAGNOSIS — Z4659 Encounter for fitting and adjustment of other gastrointestinal appliance and device: Secondary | ICD-10-CM

## 2020-02-25 DIAGNOSIS — J441 Chronic obstructive pulmonary disease with (acute) exacerbation: Principal | ICD-10-CM | POA: Diagnosis present

## 2020-02-25 DIAGNOSIS — R109 Unspecified abdominal pain: Secondary | ICD-10-CM

## 2020-02-25 DIAGNOSIS — J9811 Atelectasis: Secondary | ICD-10-CM | POA: Diagnosis present

## 2020-02-25 DIAGNOSIS — R64 Cachexia: Secondary | ICD-10-CM | POA: Diagnosis present

## 2020-02-25 DIAGNOSIS — F410 Panic disorder [episodic paroxysmal anxiety] without agoraphobia: Secondary | ICD-10-CM | POA: Diagnosis present

## 2020-02-25 DIAGNOSIS — R0602 Shortness of breath: Secondary | ICD-10-CM | POA: Diagnosis not present

## 2020-02-25 DIAGNOSIS — I959 Hypotension, unspecified: Secondary | ICD-10-CM | POA: Diagnosis not present

## 2020-02-25 DIAGNOSIS — T4275XA Adverse effect of unspecified antiepileptic and sedative-hypnotic drugs, initial encounter: Secondary | ICD-10-CM | POA: Diagnosis present

## 2020-02-25 DIAGNOSIS — I313 Pericardial effusion (noninflammatory): Secondary | ICD-10-CM | POA: Diagnosis present

## 2020-02-25 DIAGNOSIS — J9690 Respiratory failure, unspecified, unspecified whether with hypoxia or hypercapnia: Secondary | ICD-10-CM

## 2020-02-25 DIAGNOSIS — I9589 Other hypotension: Secondary | ICD-10-CM | POA: Diagnosis not present

## 2020-02-25 DIAGNOSIS — G9340 Encephalopathy, unspecified: Secondary | ICD-10-CM | POA: Diagnosis not present

## 2020-02-25 DIAGNOSIS — Y92239 Unspecified place in hospital as the place of occurrence of the external cause: Secondary | ICD-10-CM | POA: Diagnosis present

## 2020-02-25 DIAGNOSIS — Z930 Tracheostomy status: Secondary | ICD-10-CM

## 2020-02-25 DIAGNOSIS — J9622 Acute and chronic respiratory failure with hypercapnia: Secondary | ICD-10-CM | POA: Diagnosis present

## 2020-02-25 DIAGNOSIS — I110 Hypertensive heart disease with heart failure: Secondary | ICD-10-CM | POA: Diagnosis present

## 2020-02-25 DIAGNOSIS — Z7982 Long term (current) use of aspirin: Secondary | ICD-10-CM | POA: Diagnosis not present

## 2020-02-25 DIAGNOSIS — R569 Unspecified convulsions: Secondary | ICD-10-CM | POA: Diagnosis present

## 2020-02-25 DIAGNOSIS — T85898A Other specified complication of other internal prosthetic devices, implants and grafts, initial encounter: Secondary | ICD-10-CM

## 2020-02-25 DIAGNOSIS — Z6822 Body mass index (BMI) 22.0-22.9, adult: Secondary | ICD-10-CM

## 2020-02-25 DIAGNOSIS — Z452 Encounter for adjustment and management of vascular access device: Secondary | ICD-10-CM

## 2020-02-25 DIAGNOSIS — R946 Abnormal results of thyroid function studies: Secondary | ICD-10-CM | POA: Diagnosis present

## 2020-02-25 DIAGNOSIS — Z9981 Dependence on supplemental oxygen: Secondary | ICD-10-CM

## 2020-02-25 DIAGNOSIS — E872 Acidosis: Secondary | ICD-10-CM | POA: Diagnosis present

## 2020-02-25 DIAGNOSIS — G8929 Other chronic pain: Secondary | ICD-10-CM | POA: Diagnosis present

## 2020-02-25 DIAGNOSIS — R0600 Dyspnea, unspecified: Secondary | ICD-10-CM

## 2020-02-25 DIAGNOSIS — Z20822 Contact with and (suspected) exposure to covid-19: Secondary | ICD-10-CM | POA: Diagnosis present

## 2020-02-25 DIAGNOSIS — E861 Hypovolemia: Secondary | ICD-10-CM | POA: Diagnosis not present

## 2020-02-25 DIAGNOSIS — E162 Hypoglycemia, unspecified: Secondary | ICD-10-CM | POA: Diagnosis not present

## 2020-02-25 DIAGNOSIS — R140 Abdominal distension (gaseous): Secondary | ICD-10-CM

## 2020-02-25 DIAGNOSIS — Z888 Allergy status to other drugs, medicaments and biological substances status: Secondary | ICD-10-CM | POA: Diagnosis not present

## 2020-02-25 DIAGNOSIS — E871 Hypo-osmolality and hyponatremia: Secondary | ICD-10-CM | POA: Diagnosis present

## 2020-02-25 DIAGNOSIS — R918 Other nonspecific abnormal finding of lung field: Secondary | ICD-10-CM | POA: Diagnosis present

## 2020-02-25 DIAGNOSIS — Z79899 Other long term (current) drug therapy: Secondary | ICD-10-CM | POA: Diagnosis not present

## 2020-02-25 DIAGNOSIS — Z79890 Hormone replacement therapy (postmenopausal): Secondary | ICD-10-CM

## 2020-02-25 DIAGNOSIS — K567 Ileus, unspecified: Secondary | ICD-10-CM | POA: Diagnosis not present

## 2020-02-25 DIAGNOSIS — I5032 Chronic diastolic (congestive) heart failure: Secondary | ICD-10-CM | POA: Diagnosis present

## 2020-02-25 DIAGNOSIS — J9621 Acute and chronic respiratory failure with hypoxia: Secondary | ICD-10-CM | POA: Diagnosis present

## 2020-02-25 DIAGNOSIS — R7989 Other specified abnormal findings of blood chemistry: Secondary | ICD-10-CM | POA: Diagnosis present

## 2020-02-25 DIAGNOSIS — R1084 Generalized abdominal pain: Secondary | ICD-10-CM | POA: Diagnosis not present

## 2020-02-25 DIAGNOSIS — E440 Moderate protein-calorie malnutrition: Secondary | ICD-10-CM | POA: Diagnosis present

## 2020-02-25 DIAGNOSIS — J9602 Acute respiratory failure with hypercapnia: Secondary | ICD-10-CM

## 2020-02-25 DIAGNOSIS — F32A Depression, unspecified: Secondary | ICD-10-CM | POA: Diagnosis present

## 2020-02-25 DIAGNOSIS — L602 Onychogryphosis: Secondary | ICD-10-CM

## 2020-02-25 DIAGNOSIS — F419 Anxiety disorder, unspecified: Secondary | ICD-10-CM | POA: Diagnosis not present

## 2020-02-25 DIAGNOSIS — J9601 Acute respiratory failure with hypoxia: Secondary | ICD-10-CM | POA: Diagnosis not present

## 2020-02-25 DIAGNOSIS — R34 Anuria and oliguria: Secondary | ICD-10-CM | POA: Diagnosis present

## 2020-02-25 DIAGNOSIS — R578 Other shock: Secondary | ICD-10-CM | POA: Diagnosis present

## 2020-02-25 DIAGNOSIS — E039 Hypothyroidism, unspecified: Secondary | ICD-10-CM | POA: Diagnosis present

## 2020-02-25 DIAGNOSIS — R0902 Hypoxemia: Secondary | ICD-10-CM

## 2020-02-25 DIAGNOSIS — E876 Hypokalemia: Secondary | ICD-10-CM | POA: Diagnosis not present

## 2020-02-25 DIAGNOSIS — K3184 Gastroparesis: Secondary | ICD-10-CM | POA: Diagnosis present

## 2020-02-25 DIAGNOSIS — R1310 Dysphagia, unspecified: Secondary | ICD-10-CM | POA: Diagnosis not present

## 2020-02-25 DIAGNOSIS — D649 Anemia, unspecified: Secondary | ICD-10-CM | POA: Diagnosis present

## 2020-02-25 DIAGNOSIS — Z9911 Dependence on respirator [ventilator] status: Secondary | ICD-10-CM | POA: Diagnosis not present

## 2020-02-25 DIAGNOSIS — G40909 Epilepsy, unspecified, not intractable, without status epilepticus: Secondary | ICD-10-CM | POA: Diagnosis not present

## 2020-02-25 DIAGNOSIS — J449 Chronic obstructive pulmonary disease, unspecified: Secondary | ICD-10-CM | POA: Diagnosis not present

## 2020-02-25 LAB — BLOOD GAS, ARTERIAL
Acid-Base Excess: 4.9 mmol/L — ABNORMAL HIGH (ref 0.0–2.0)
Acid-Base Excess: 5.8 mmol/L — ABNORMAL HIGH (ref 0.0–2.0)
Bicarbonate: 32.8 mmol/L — ABNORMAL HIGH (ref 20.0–28.0)
Bicarbonate: 31.8 mmol/L — ABNORMAL HIGH (ref 20.0–28.0)
Drawn by: 13746
FIO2: 100
FIO2: 50
O2 Saturation: 99.7 %
O2 Saturation: 99.1 %
Patient temperature: 36.6
Patient temperature: 36.6
pCO2 arterial: 88.1 mmHg (ref 32.0–48.0)
pCO2 arterial: 63.5 mmHg — ABNORMAL HIGH (ref 32.0–48.0)
pH, Arterial: 7.193 — CL (ref 7.350–7.450)
pH, Arterial: 7.318 — ABNORMAL LOW (ref 7.350–7.450)
pO2, Arterial: 371 mmHg — ABNORMAL HIGH (ref 83.0–108.0)
pO2, Arterial: 159 mmHg — ABNORMAL HIGH (ref 83.0–108.0)

## 2020-02-25 LAB — COMPREHENSIVE METABOLIC PANEL
ALT: 13 U/L (ref 0–44)
AST: 26 U/L (ref 15–41)
Albumin: 3.8 g/dL (ref 3.5–5.0)
Alkaline Phosphatase: 51 U/L (ref 38–126)
Anion gap: 11 (ref 5–15)
BUN: 16 mg/dL (ref 6–20)
CO2: 37 mmol/L — ABNORMAL HIGH (ref 22–32)
Calcium: 9.5 mg/dL (ref 8.9–10.3)
Chloride: 84 mmol/L — ABNORMAL LOW (ref 98–111)
Creatinine, Ser: 0.69 mg/dL (ref 0.44–1.00)
GFR, Estimated: >60 mL/min (ref >60)
Glucose, Bld: 113 mg/dL — ABNORMAL HIGH (ref 70–99)
Potassium: 3.8 mmol/L (ref 3.5–5.1)
Sodium: 132 mmol/L — ABNORMAL LOW (ref 135–145)
Total Bilirubin: 0.6 mg/dL (ref 0.3–1.2)
Total Protein: 7.9 g/dL (ref 6.5–8.1)

## 2020-02-25 LAB — URINALYSIS, ROUTINE W REFLEX MICROSCOPIC
Bacteria, UA: NONE SEEN
Bilirubin Urine: NEGATIVE
Glucose, UA: NEGATIVE mg/dL
Hgb urine dipstick: NEGATIVE
Ketones, ur: 5 mg/dL — AB
Leukocytes,Ua: NEGATIVE
Nitrite: NEGATIVE
Protein, ur: 100 mg/dL — AB
Specific Gravity, Urine: 1.016 (ref 1.005–1.030)
pH: 5 (ref 5.0–8.0)

## 2020-02-25 LAB — CBC WITH DIFFERENTIAL/PLATELET
Abs Immature Granulocytes: 0.06 10*3/uL (ref 0.00–0.07)
Basophils Absolute: 0 10*3/uL (ref 0.0–0.1)
Basophils Relative: 1 %
Eosinophils Absolute: 0 10*3/uL (ref 0.0–0.5)
Eosinophils Relative: 1 %
HCT: 40.2 % (ref 36.0–46.0)
Hemoglobin: 12.5 g/dL (ref 12.0–15.0)
Immature Granulocytes: 1 %
Lymphocytes Relative: 15 %
Lymphs Abs: 1.3 10*3/uL (ref 0.7–4.0)
MCH: 29.5 pg (ref 26.0–34.0)
MCHC: 31.1 g/dL (ref 30.0–36.0)
MCV: 94.8 fL (ref 80.0–100.0)
Monocytes Absolute: 0.5 10*3/uL (ref 0.1–1.0)
Monocytes Relative: 5 %
Neutro Abs: 6.9 10*3/uL (ref 1.7–7.7)
Neutrophils Relative %: 77 %
Platelets: 332 10*3/uL (ref 150–400)
RBC: 4.24 MIL/uL (ref 3.87–5.11)
RDW: 14.4 % (ref 11.5–15.5)
WBC: 8.8 10*3/uL (ref 4.0–10.5)
nRBC: 0 % (ref 0.0–0.2)

## 2020-02-25 LAB — HEPATIC FUNCTION PANEL
ALT: 13 U/L (ref 0–44)
AST: 26 U/L (ref 15–41)
Albumin: 3.9 g/dL (ref 3.5–5.0)
Alkaline Phosphatase: 56 U/L (ref 38–126)
Bilirubin, Direct: 0.2 mg/dL (ref 0.0–0.2)
Indirect Bilirubin: 0.2 mg/dL — ABNORMAL LOW (ref 0.3–0.9)
Total Bilirubin: 0.4 mg/dL (ref 0.3–1.2)
Total Protein: 8.2 g/dL — ABNORMAL HIGH (ref 6.5–8.1)

## 2020-02-25 LAB — ETHANOL: Alcohol, Ethyl (B): <10 mg/dL (ref <10)

## 2020-02-25 LAB — RAPID URINE DRUG SCREEN, HOSP PERFORMED
Amphetamines: NOT DETECTED
Barbiturates: NOT DETECTED
Benzodiazepines: NOT DETECTED
Cocaine: NOT DETECTED
Opiates: NOT DETECTED
Tetrahydrocannabinol: NOT DETECTED

## 2020-02-25 LAB — RESPIRATORY PANEL BY RT PCR (FLU A&B, COVID)
Influenza A by PCR: NEGATIVE
Influenza B by PCR: NEGATIVE
SARS Coronavirus 2 by RT PCR: NEGATIVE

## 2020-02-25 LAB — MRSA PCR SCREENING: MRSA by PCR: NEGATIVE

## 2020-02-25 LAB — T4, FREE: Free T4: 0.77 ng/dL (ref 0.61–1.12)

## 2020-02-25 LAB — CBG MONITORING, ED: Glucose-Capillary: 105 mg/dL — ABNORMAL HIGH (ref 70–99)

## 2020-02-25 LAB — GLUCOSE, CAPILLARY
Glucose-Capillary: 174 mg/dL — ABNORMAL HIGH (ref 70–99)
Glucose-Capillary: 154 mg/dL — ABNORMAL HIGH (ref 70–99)
Glucose-Capillary: 162 mg/dL — ABNORMAL HIGH (ref 70–99)

## 2020-02-25 LAB — TSH: TSH: 12.532 u[IU]/mL — ABNORMAL HIGH (ref 0.350–4.500)

## 2020-02-25 LAB — LACTIC ACID, PLASMA: Lactic Acid, Venous: 1.3 mmol/L (ref 0.5–1.9)

## 2020-02-25 LAB — BRAIN NATRIURETIC PEPTIDE: B Natriuretic Peptide: 282.7 pg/mL — ABNORMAL HIGH (ref 0.0–100.0)

## 2020-02-25 MED ORDER — DEXMEDETOMIDINE HCL IN NACL 400 MCG/100ML IV SOLN
0.4000 ug/kg/h | INTRAVENOUS | Status: DC
  Administered 2020-02-25: 0.6 ug/kg/h via INTRAVENOUS
  Administered 2020-02-25: 0.5 ug/kg/h via INTRAVENOUS
  Administered 2020-02-26: 1 ug/kg/h via INTRAVENOUS
  Filled 2020-02-25 (×3): qty 100

## 2020-02-25 MED ORDER — SODIUM CHLORIDE 0.9 % IV BOLUS (SEPSIS)
1000.0000 mL | Freq: Once | INTRAVENOUS | Status: AC
  Administered 2020-02-25: 1000 mL via INTRAVENOUS

## 2020-02-25 MED ORDER — ORAL CARE MOUTH RINSE
15.0000 mL | OROMUCOSAL | Status: DC
  Administered 2020-02-25 – 2020-03-08 (×112): 15 mL via OROMUCOSAL

## 2020-02-25 MED ORDER — IPRATROPIUM-ALBUTEROL 0.5-2.5 (3) MG/3ML IN SOLN
RESPIRATORY_TRACT | Status: AC
  Filled 2020-02-25: qty 3

## 2020-02-25 MED ORDER — METRONIDAZOLE IN NACL 5-0.79 MG/ML-% IV SOLN
500.0000 mg | Freq: Once | INTRAVENOUS | Status: AC
  Administered 2020-02-25: 500 mg via INTRAVENOUS
  Filled 2020-02-25: qty 100

## 2020-02-25 MED ORDER — DEXTROSE 5 % IV SOLN
250.0000 mg | INTRAVENOUS | Status: AC
  Administered 2020-02-26 – 2020-02-29 (×4): 250 mg via INTRAVENOUS
  Filled 2020-02-25 (×4): qty 250

## 2020-02-25 MED ORDER — DOCUSATE SODIUM 50 MG/5ML PO LIQD
100.0000 mg | Freq: Two times a day (BID) | ORAL | Status: DC
  Administered 2020-02-25 (×2): 100 mg via ORAL
  Filled 2020-02-25 (×5): qty 10

## 2020-02-25 MED ORDER — SODIUM CHLORIDE 0.9 % IV SOLN
1.0000 g | INTRAVENOUS | Status: AC
  Administered 2020-02-26 – 2020-03-02 (×6): 1 g via INTRAVENOUS
  Filled 2020-02-25: qty 1
  Filled 2020-02-25: qty 10
  Filled 2020-02-25: qty 1
  Filled 2020-02-25 (×3): qty 10
  Filled 2020-02-25: qty 1

## 2020-02-25 MED ORDER — SODIUM CHLORIDE 0.9 % IV SOLN: 2.0000 g | Freq: Three times a day (TID) | INTRAVENOUS | Status: DC

## 2020-02-25 MED ORDER — SUCCINYLCHOLINE CHLORIDE 20 MG/ML IJ SOLN
INTRAMUSCULAR | Status: AC | PRN
  Administered 2020-02-25: 100 mg via INTRAVENOUS

## 2020-02-25 MED ORDER — FENTANYL CITRATE (PF) 100 MCG/2ML IJ SOLN
INTRAMUSCULAR | Status: AC | PRN
Start: 2020-02-25 — End: 2020-02-25
  Administered 2020-02-25: 50 ug via INTRAVENOUS

## 2020-02-25 MED ORDER — PROPOFOL 1000 MG/100ML IV EMUL: 0.0000 ug/kg/min | INTRAVENOUS | Status: DC

## 2020-02-25 MED ORDER — VANCOMYCIN HCL IN DEXTROSE 1-5 GM/200ML-% IV SOLN
1000.0000 mg | Freq: Once | INTRAVENOUS | Status: AC
  Administered 2020-02-25: 1000 mg via INTRAVENOUS
  Filled 2020-02-25: qty 200

## 2020-02-25 MED ORDER — METHYLPREDNISOLONE SODIUM SUCC 40 MG IJ SOLR
40.0000 mg | Freq: Every day | INTRAMUSCULAR | Status: AC
  Administered 2020-02-25 – 2020-02-29 (×5): 40 mg via INTRAVENOUS
  Filled 2020-02-25 (×5): qty 1

## 2020-02-25 MED ORDER — FENTANYL CITRATE (PF) 100 MCG/2ML IJ SOLN
INTRAMUSCULAR | Status: AC
  Filled 2020-02-25: qty 2

## 2020-02-25 MED ORDER — FENTANYL 2500MCG IN NS 250ML (10MCG/ML) PREMIX INFUSION
0.0000 ug/h | INTRAVENOUS | Status: DC
  Administered 2020-02-25: 20 ug/h via INTRAVENOUS
  Administered 2020-02-26: 100 ug/h via INTRAVENOUS
  Administered 2020-02-27 – 2020-02-28 (×3): 200 ug/h via INTRAVENOUS
  Administered 2020-03-03: 25 ug/h via INTRAVENOUS
  Administered 2020-03-04: 100 ug/h via INTRAVENOUS
  Administered 2020-03-04: 125 ug/h via INTRAVENOUS
  Administered 2020-03-06: 50 ug/h via INTRAVENOUS
  Filled 2020-02-25 (×9): qty 250

## 2020-02-25 MED ORDER — SODIUM CHLORIDE 0.9% FLUSH: 10.0000 mL | INTRAVENOUS | Status: DC | PRN

## 2020-02-25 MED ORDER — SODIUM CHLORIDE 0.9 % IV BOLUS (SEPSIS)
500.0000 mL | Freq: Once | INTRAVENOUS | Status: AC
  Administered 2020-02-25: 500 mL via INTRAVENOUS

## 2020-02-25 MED ORDER — VANCOMYCIN HCL 500 MG/100ML IV SOLN: 500.0000 mg | Freq: Two times a day (BID) | INTRAVENOUS | Status: DC

## 2020-02-25 MED ORDER — SODIUM CHLORIDE 0.9 % IV BOLUS (SEPSIS)
250.0000 mL | Freq: Once | INTRAVENOUS | Status: AC
  Administered 2020-02-25: 250 mL via INTRAVENOUS

## 2020-02-25 MED ORDER — ETOMIDATE 2 MG/ML IV SOLN
INTRAVENOUS | Status: AC | PRN
  Administered 2020-02-25: 20 mg via INTRAVENOUS

## 2020-02-25 MED ORDER — LACTATED RINGERS IV BOLUS
1000.0000 mL | Freq: Once | INTRAVENOUS | Status: AC
  Administered 2020-02-25: 1000 mL via INTRAVENOUS

## 2020-02-25 MED ORDER — CHLORHEXIDINE GLUCONATE 0.12% ORAL RINSE (MEDLINE KIT)
15.0000 mL | Freq: Two times a day (BID) | OROMUCOSAL | Status: DC
  Administered 2020-02-25 – 2020-03-08 (×24): 15 mL via OROMUCOSAL

## 2020-02-25 MED ORDER — SODIUM CHLORIDE 0.9 % IV SOLN
500.0000 mg | INTRAVENOUS | Status: AC
  Administered 2020-02-25: 500 mg via INTRAVENOUS
  Filled 2020-02-25: qty 500

## 2020-02-25 MED ORDER — SODIUM CHLORIDE 0.9 % IV SOLN
2.0000 g | Freq: Once | INTRAVENOUS | Status: AC
  Administered 2020-02-25: 2 g via INTRAVENOUS
  Filled 2020-02-25: qty 2

## 2020-02-25 MED ORDER — DEXMEDETOMIDINE BOLUS VIA INFUSION
1.0000 ug/kg | Freq: Once | INTRAVENOUS | Status: DC
  Filled 2020-02-25: qty 51

## 2020-02-25 MED ORDER — IPRATROPIUM-ALBUTEROL 0.5-2.5 (3) MG/3ML IN SOLN
3.0000 mL | Freq: Four times a day (QID) | RESPIRATORY_TRACT | Status: DC
  Administered 2020-02-25 – 2020-02-27 (×10): 3 mL via RESPIRATORY_TRACT
  Filled 2020-02-25 (×9): qty 3

## 2020-02-25 MED ORDER — NOREPINEPHRINE 4 MG/250ML-% IV SOLN
0.0000 ug/min | INTRAVENOUS | Status: DC
  Administered 2020-02-25: 20 ug/min via INTRAVENOUS
  Administered 2020-02-25: 35 ug/min via INTRAVENOUS
  Administered 2020-02-26: 15 ug/min via INTRAVENOUS
  Administered 2020-02-26: 21 ug/min via INTRAVENOUS
  Administered 2020-02-26: 7 ug/min via INTRAVENOUS
  Administered 2020-02-26: 15 ug/min via INTRAVENOUS
  Administered 2020-02-27: 8 ug/min via INTRAVENOUS
  Administered 2020-02-27: 10 ug/min via INTRAVENOUS
  Filled 2020-02-25 (×6): qty 250
  Filled 2020-02-25: qty 500

## 2020-02-25 MED ORDER — FAMOTIDINE IN NACL 20-0.9 MG/50ML-% IV SOLN
20.0000 mg | Freq: Two times a day (BID) | INTRAVENOUS | Status: DC
  Administered 2020-02-25 – 2020-02-27 (×4): 20 mg via INTRAVENOUS
  Filled 2020-02-25 (×4): qty 50

## 2020-02-25 MED ORDER — CHLORHEXIDINE GLUCONATE CLOTH 2 % EX PADS
6.0000 | MEDICATED_PAD | Freq: Every day | CUTANEOUS | Status: DC
  Administered 2020-02-26 – 2020-03-08 (×13): 6 via TOPICAL

## 2020-02-25 MED ORDER — SODIUM CHLORIDE 0.9% FLUSH
10.0000 mL | Freq: Two times a day (BID) | INTRAVENOUS | Status: DC
  Administered 2020-02-25 – 2020-03-06 (×16): 10 mL

## 2020-02-25 MED ORDER — ENOXAPARIN SODIUM 40 MG/0.4ML ~~LOC~~ SOLN
40.0000 mg | SUBCUTANEOUS | Status: DC
  Administered 2020-02-25 – 2020-02-27 (×3): 40 mg via SUBCUTANEOUS
  Filled 2020-02-25 (×4): qty 0.4

## 2020-02-25 MED ORDER — SODIUM CHLORIDE 0.9 % IV SOLN: INTRAVENOUS | Status: DC

## 2020-02-25 MED ORDER — POLYETHYLENE GLYCOL 3350 17 G PO PACK
17.0000 g | PACK | Freq: Every day | ORAL | Status: DC
  Administered 2020-02-25: 17 g via ORAL
  Filled 2020-02-25 (×2): qty 1

## 2020-02-25 MED ORDER — LACTATED RINGERS IV SOLN: INTRAVENOUS | Status: DC

## 2020-02-25 NOTE — Progress Notes (Signed)
eLink is monitoring this sepsis. Thank you 

## 2020-02-25 NOTE — ED Triage Notes (Signed)
Pt bib ems from home with respiratory distress. HX copd. Pt given 5mg  albuterol, 0.5mg  atrovent, 125 solumedrol and 2g magnesium en route. Prior to arrival pt went to agonal breathing and pt assisted with ventilations on arrival.

## 2020-02-25 NOTE — Procedures (Signed)
Central Venous Catheter Insertion Procedure Note Kathleen Cameron [id number] 05/01/1965  Procedure: Insertion of Central Venous Catheter Indications: Drug and/or fluid administration  Procedure Details Consent: Unable to obtain consent because of altered level of consciousness. Time Out: Verified patient identification, verified procedure, site/side was marked, verified correct patient position, special equipment/implants available, medications/allergies/relevent history reviewed, required imaging and test results available.  Performed  Maximum sterile technique was used including antiseptics, cap, gloves, gown, hand hygiene, mask and sheet. Skin prep: Chlorhexidine; local anesthetic administered A antimicrobial bonded/coated triple lumen catheter was placed in the right internal jugular vein using the Seldinger technique. Catheter placed to 15 cm. Blood aspirated via all 3 ports and then flushed x 3. Line sutured x 3 and dressing applied.  Ultrasound guidance used.Yes.    Evaluation Blood flow good Complications: No apparent complications Patient did tolerate procedure well. Chest X-ray ordered to verify placement.  CXR: pending  Versie Starks, DO 02/25/2020, 4:30 PM Pager: [phone]

## 2020-02-25 NOTE — ED Provider Notes (Addendum)
[facility] EMERGENCY DEPARTMENT Provider Note   CSN: [id number] Arrival date & time: 02/25/20  1032     History Chief Complaint  Patient presents with  . Respiratory Distress    Kathleen Cameron is a 54 y.o. female.  Patient brought in by patient brought in by EMS for respiratory distress.  Patient was tripoding at home.  They did not hear any wheezing but there was poor air movement.  Patient has a known history of COPD.  Patient went unresponsive shortly prior to arrival got here was being bagged.  Patient was doing some breathing on their own.  But not adequate.  Prepared for intubation.  Lungs were clear no wheezing upon arrival.  Patient's blood pressure was fine had a good carotid pulse.  Heart monitor showed no significant arrhythmia.        Past Medical History:  Diagnosis Date  . Asthma   . COPD (chronic obstructive pulmonary disease) (HCC)   . Hypertension     Patient Active Problem List   Diagnosis Date Noted  . Right upper quadrant abdominal pain 02/01/2020  . Hypothyroidism (acquired) 02/01/2020  . Acute metabolic encephalopathy 01/29/2020  . Hyponatremia 01/28/2020  . Anxiety 12/02/2019  . COPD exacerbation (HCC) 11/06/2019  . Depression 11/06/2019  . Acute on chronic respiratory failure with hypoxia and hypercapnia (HCC) 11/06/2019  . Respiratory failure (HCC) 11/06/2019  . Current moderate episode of major depressive disorder without prior episode (HCC) 09/20/2019  . Pedal edema 08/04/2019  . Chronic respiratory failure with hypoxia (HCC) 03/07/2019  . CKD (chronic kidney disease) 01/05/2019  . COPD (chronic obstructive pulmonary disease) (HCC) 09/29/2018  . Pulmonary nodules 09/27/2018  . Dyslipidemia 07/07/2018  . Essential hypertension 03/10/2018    Past Surgical History:  Procedure Laterality Date  . WISDOM TOOTH EXTRACTION       OB History   No obstetric history on file.     Family History  Family history  unknown: Yes    Social History   Tobacco Use  . Smoking status: Never Smoker  . Smokeless tobacco: Never Used  Vaping Use  . Vaping Use: Never used  Substance Use Topics  . Alcohol use: Yes    Alcohol/week: 2.0 standard drinks    Types: 2 Cans of beer per week  . Drug use: Never    Home Medications Prior to Admission medications   Medication Sig Start Date End Date Taking? Authorizing Provider  acetaminophen (TYLENOL) 500 MG tablet Take 500 mg by mouth daily as needed for mild pain or headache.    [provider]  albuterol (VENTOLIN HFA) 108 (90 Base) MCG/ACT inhaler Inhale into the lungs every 6 (six) hours as needed for wheezing or shortness of breath.    [provider]  ALPRAZolam Prudy Feeler) 0.25 MG tablet Take 1 tablet (0.25 mg total) by mouth 3 (three) times daily as needed for anxiety. Patient taking differently: Take 0.25 mg by mouth 3 (three) times daily as needed for anxiety (shingles pain).  01/02/20   [name], MD  alum & mag hydroxide-simeth (MAALOX/MYLANTA) 200-200-20 MG/5ML suspension Take 15 mLs by mouth every 6 (six) hours as needed for indigestion or heartburn. 11/12/19   Sherryll Burger, Pratik D, DO  amLODipine (NORVASC) 5 MG tablet Take 1 tablet (5 mg total) by mouth daily. 08/09/19 02/19/20  [name], MD  aspirin EC 81 MG EC tablet Take 1 tablet (81 mg total) by mouth daily. 04/17/19   [name], MD  bisoprolol (  ZEBETA) 10 MG tablet Take 1 tablet (10 mg total) by mouth daily. 10/20/19   [name], MD  Budeson-Glycopyrrol-Formoterol (BREZTRI AEROSPHERE) 160-9-4.8 MCG/ACT AERO Inhale 2 puffs into the lungs in the morning and at bedtime. Patient taking differently: Inhale 1 puff into the lungs See admin instructions. Inhale 1 puff into the lungs when walking to bathroom - 6-7 times daily 12/02/19   [name], NP  Dextromethorphan-guaiFENesin [facility] DM MAXIMUM STRENGTH) 60-1200 MG TB12 Take 1 tablet by mouth 2 (two)  times daily as needed (congestion).    [provider]  escitalopram (LEXAPRO) 20 MG tablet Take 1 tablet (20 mg total) by mouth daily. 01/02/20 04/01/20  [name], MD  famotidine (PEPCID) 10 MG tablet Take 10 mg by mouth 2 (two) times daily as needed for heartburn or indigestion (acid reflux).    [provider]  HYDROcodone-acetaminophen (NORCO) 5-325 MG tablet Take 1 tablet by mouth every 6 (six) hours as needed. Patient not taking: Reported on 02/08/2020 01/19/20   [name], MD  ipratropium-albuterol (DUONEB) 0.5-2.5 (3) MG/3ML SOLN Take 3 mLs by nebulization every 6 (six) hours as needed. Patient not taking: Reported on 02/08/2020 05/03/19   [name], NP  levothyroxine (SYNTHROID) 50 MCG tablet Take 1 tablet (50 mcg total) by mouth daily at 6 (six) AM. 02/03/20   Marinda Elk, MD  nitroGLYCERIN (NITROSTAT) 0.4 MG SL tablet PLACE 1 TABLET(0.4 MG TOTAL) UNDER THE TONGUE EVERY 5 MINUTES AS NEEDED FOR CHEST PAIN Patient taking differently: Place 0.4 mg under the tongue every 5 (five) minutes as needed for chest pain.  01/10/20   [name], MD  OXYGEN Inhale 2 L into the lungs continuous.    [provider]  pantoprazole (PROTONIX) 40 MG tablet Take 1 tablet (40 mg total) by mouth daily. 10/27/19   [name], MD  polyethylene glycol (MIRALAX / GLYCOLAX) 17 g packet Take 17 g by mouth daily as needed (constipation).    [provider]  prednisoLONE acetate (PRED FORTE) 1 % ophthalmic suspension Place 1 drop into the left eye 4 (four) times daily. 01/10/20   [provider]  rosuvastatin (CRESTOR) 40 MG tablet Take 1 tablet (40 mg total) by mouth daily. 01/25/20 01/19/21  [name], MD  valACYclovir (VALTREX) 1000 MG tablet Take 1,000 mg by mouth daily. 01/10/20   [provider]    Allergies    Stiolto respimat [tiotropium bromide-olodaterol]  Review of Systems   Review  of Systems  Unable to perform ROS: Patient unresponsive    Physical Exam Updated Vital Signs BP (!) 89/70   Pulse 61   Temp (!) 97 F (36.1 C)   Resp 18   Ht 1.626 m (5\' 4" )   Wt 51 kg   SpO2 100%   BMI 19.30 kg/m   Physical Exam Vitals and nursing note reviewed.  Constitutional:      General: She is in acute distress.     Appearance: She is well-developed.  HENT:     Head: Normocephalic and atraumatic.  Eyes:     Conjunctiva/sclera: Conjunctivae normal.  Cardiovascular:     Rate and Rhythm: Normal rate and regular rhythm.     Heart sounds: No murmur heard.   Pulmonary:     Effort: Pulmonary effort is normal. No respiratory distress.     Breath sounds: Normal breath sounds.  Abdominal:     Palpations: Abdomen is soft.  Tenderness: There is no abdominal tenderness.  Musculoskeletal:        General: No swelling.     Cervical back: Neck supple.  Skin:    General: Skin is warm and dry.  Neurological:     Comments: Patient unresponsive some blinking of the eyes and some breathing on her own.  Patient without any significant movement of the extremities.       ED Results / Procedures / Treatments   Labs (all labs ordered are listed, but only abnormal results are displayed) Labs Reviewed  COMPREHENSIVE METABOLIC PANEL - Abnormal; Notable for the following components:      Result Value   Sodium 132 (*)    Chloride 84 (*)    CO2 37 (*)    Glucose, Bld 113 (*)    All other components within normal limits  URINALYSIS, ROUTINE W REFLEX MICROSCOPIC - Abnormal; Notable for the following components:   Ketones, ur 5 (*)    Protein, ur 100 (*)    All other components within normal limits  TSH - Abnormal; Notable for the following components:   TSH 12.532 (*)    All other components within normal limits  BLOOD GAS, ARTERIAL - Abnormal; Notable for the following components:   pH, Arterial 7.193 (*)    pCO2 arterial 88.1 (*)    pO2, Arterial 371 (*)    Bicarbonate  32.8 (*)    Acid-Base Excess 4.9 (*)    All other components within normal limits  CBG MONITORING, ED - Abnormal; Notable for the following components:   Glucose-Capillary 105 (*)    All other components within normal limits  RESPIRATORY PANEL BY RT PCR (FLU A&B, COVID)  CULTURE, BLOOD (SINGLE)  CBC WITH DIFFERENTIAL/PLATELET  LACTIC ACID, PLASMA  RAPID URINE DRUG SCREEN, HOSP PERFORMED  ETHANOL  BRAIN NATRIURETIC PEPTIDE  I-STAT ARTERIAL BLOOD GAS, ED    EKG EKG Interpretation  Date/Time:  Saturday February 25 2020 10:57:09 EDT Ventricular Rate:  74 PR Interval:    QRS Duration: 85 QT Interval:  443 QTC Calculation: 492 R Axis:   58 Text Interpretation: Sinus rhythm Right atrial enlargement LVH with secondary repolarization abnormality Anterior infarct, old New ST depression laterally Confirmed by Vanetta Mulders [phone]) on 02/25/2020 11:32:12 AM   Radiology DG Chest Port 1 View  Result Date: 02/25/2020 CLINICAL DATA:  Respiratory failure, intubation EXAM: PORTABLE CHEST 1 VIEW COMPARISON:  Portable exam 1104 hours compared to 02/01/2020 FINDINGS: Tip of endotracheal tube projects 4.6 cm above carina. Nasogastric tube extends into stomach. Normal heart size, mediastinal contours, and pulmonary vascularity. Atherosclerotic calcification and tortuosity of thoracic aorta. Emphysematous changes with decreased subsegmental atelectasis in the lower lungs bilaterally. Calcified granuloma RIGHT upper lobe. Questionable subtle LEFT perihilar infiltrate. Remaining lungs clear. No pneumothorax or significant pleural effusion. IMPRESSION: COPD changes with decreased basilar atelectasis and questionable subtle LEFT perihilar infiltrate. Aortic Atherosclerosis (ICD10-I70.0). Electronically Signed   By: Ulyses Southward M.D.   On: 02/25/2020 11:44    Procedures Procedures (including critical care time)  CRITICAL CARE Performed by: Vanetta Mulders Total critical care time: 60 minutes Critical  care time was exclusive of separately billable procedures and treating other patients. Critical care was necessary to treat or prevent imminent or life-threatening deterioration. Critical care was time spent personally by me on the following activities: development of treatment plan with patient and/or surrogate as well as nursing, discussions with consultants, evaluation of patient's response to treatment, examination of patient, obtaining history from patient or  surrogate, ordering and performing treatments and interventions, ordering and review of laboratory studies, ordering and review of radiographic studies, pulse oximetry and re-evaluation of patient's condition.  INTUBATION Performed by: Vanetta Mulders  Required items: required blood products, implants, devices, and special equipment available Patient identity confirmed: provided demographic data and hospital-assigned identification number Time out: Immediately prior to procedure a "time out" was called to verify the correct patient, procedure, equipment, support staff and site/side marked as required.  Indications: Unresponsive respiratory failure.  Intubation method: 3 Glidescope Laryngoscopy   Preoxygenation: 100%BVM  Sedatives: 20 mg Etomidate Paralytic: 100 mg Succinylcholine  Tube Size: 7.5 cuffed  Post-procedure assessment: chest rise and ETCO2 monitor Breath sounds: equal and absent over the epigastrium Tube secured with: ETT holder Chest x-ray interpreted by radiologist and me.  Chest x-ray findings: endotracheal tube in appropriate position  Patient tolerated the procedure well with no immediate complications.     Medications Ordered in ED Medications  0.9 %  sodium chloride infusion ( Intravenous New Bag/Given 02/25/20 1141)  fentaNYL (SUBLIMAZE) 100 MCG/2ML injection (has no administration in time range)  lactated ringers infusion (has no administration in time range)  ceFEPIme (MAXIPIME) 2 g in sodium  chloride 0.9 % 100 mL IVPB (2 g Intravenous New Bag/Given 02/25/20 1147)  metroNIDAZOLE (FLAGYL) IVPB 500 mg (has no administration in time range)  vancomycin (VANCOCIN) IVPB 1000 mg/200 mL premix (has no administration in time range)  etomidate (AMIDATE) injection (20 mg Intravenous Given 02/25/20 1034)  succinylcholine (ANECTINE) injection (100 mg Intravenous Given 02/25/20 1034)  fentaNYL (SUBLIMAZE) injection (50 mcg Intravenous Given 02/25/20 1107)  sodium chloride 0.9 % bolus 1,000 mL (0 mLs Intravenous Stopped 02/25/20 1147)    And  sodium chloride 0.9 % bolus 500 mL (500 mLs Intravenous New Bag/Given 02/25/20 1140)    And  sodium chloride 0.9 % bolus 250 mL (250 mLs Intravenous New Bag/Given 02/25/20 1140)    ED Course  I have reviewed the triage vital signs and the nursing notes.  Pertinent labs & imaging results that were available during my care of the patient were reviewed by me and considered in my medical decision making (see chart for details).    MDM Rules/Calculators/A&P                          Patient arrived in significant respiratory distress.  Not responsive.  Patient required immediate intubation.  Presumed that perhaps PCO2 had become markedly elevated.  Patient with good pulsatile heart monitor without any significant arrhythmia.  Blood pressure was 130 systolic.  Patient's intubation went without any difficulty.  Shortly after intubation patient blood pressure dropped precipitously to systolics in the 50s.  Patient was fighting the tube some at that point time.  Patient temporarily had propofol started but that it been stopped for a while patient given some fluids blood pressure improved up to the 60s.  So norepinephrine started.  And then titrated to maintain adequate pressure.  Arterial blood gas showed sort of expected findings but since she been on the ventilator for a little while and this was done still showed that was not adequately ventilated.  pH was 7.19.  PCO2  was 88.  And PO2 was good at 371.  Chest x-ray does show COPD changes.  May be a subtle left perihilar infiltrate.  Critical care contacted and will admit.  Feel that this is predominantly a respiratory failure with hypercapnia.  And not truly sepsis.  The patient was initiated with some sepsis orders and antibiotics when she dropped her pressures and they said the pressure stayed down for period of time.  So we started the norepinephrine.    Final Clinical Impression(s) / ED Diagnoses Final diagnoses:  Acute respiratory failure with hypoxia and hypercapnia (HCC)  Chronic obstructive pulmonary disease with acute exacerbation [facility](HCC)    Rx / DC Orders ED Discharge Orders    None       Vanetta MuldersZackowski, Devansh Riese, MD 02/25/20 1203    Vanetta MuldersZackowski, Rhyker Silversmith, MD 02/25/20 1501

## 2020-02-25 NOTE — H&P (Addendum)
NAME:  Margreat Widener, MRN:  [id number], DOB:  Aug 28, 1965, LOS: 0 ADMISSION DATE:  02/25/2020, CONSULTATION DATE:  02/25/2020 REFERRING MD:  Vanetta Mulders, MD, CHIEF COMPLAINT:  SOB  Brief History   54yo female with history of COPD presented with shortness of breath, intubated in ED.   History of present illness   54yo female with PMH COPD (no history of smoking with 4 admissions for exacerbation over the last year on supplemental 2L at home, hypertension who presented via EMS in respiratory distress and hypercapnic respiratory failure requiring intubation.   Patient awake off sedation at bedside due to earlier hypotension. She had increased shortness of breath this morning, she denies chest pain. She endorses right abdominal pain, which she states is chronic. She also has chronic constipation with last BM three days ago. She has not noted any blood in her stool.   Discussed with husband over the phone. He states she has a lot of anxiety at baseline and is not sleeping at night, which he thinks led to this. She had sudden SOB this morning and was desatting to 35%, although unclear if perhaps he meant respirations, and he tried to turn up her home supplemental oxygen but she only got progressively worst. He states she has not had recent cough, sick contact, fever that he knows of. She has been using her inhalers. She chronically has shortness of breath and can only walk to and from the bathroom. He states she has had increased abdominal discomfort after eating recently but that this is ongoing.   In the the ER she was intubated with initial propofol leading to drop in blood pressure, which was stopped. She was started on levophed as well and broad spectrum antibiotics.   Past Medical History  COPD Asthma Hypertension    Significant Hospital Events   ETT 11/6 >>   Consults:    Procedures:  ETT 11/6 >>  Significant Diagnostic Tests:  CXR 11/6 >> enlarged lung volumes, atelectasis     10/14/2018-pulmonary function test-FVC 1.08 (45% predicted) postbronchodilator ratio 57, postbronchodilator FEV1 0.56 (29% predicted), no bronchodilator response   Micro Data:  Blood culture 11/6 >>   Antimicrobials:  Vanc 11/6 Cefepime 11/6 Metro 11/6 Azithro 11/6 >> Ceftriaxone 11/6 >>  Interim history/subjective:    Objective   Blood pressure (!) 84/70, pulse 75, temperature 98.5 F (36.9 C), temperature source Bladder, resp. rate (!) 24, height 5\' 4"  (1.626 m), weight 51 kg, SpO2 100 %.    Vent Mode: PCV FiO2 (%):  [50 %-100 %] 50 % Set Rate:  [20 bmp-30 bmp] 30 bmp Vt Set:  [430 mL] 430 mL PEEP:  [5 cmH20] 5 cmH20 Plateau Pressure:  [28 cmH20-39 cmH20] 39 cmH20  No intake or output data in the 24 hours ending 02/25/20 1255 Filed Weights   02/25/20 1043  Weight: 51 kg    Examination: General: acutely ill appearing female, thin, intubated, awake HENT: [place]/AT, ETT in place, EOM intact Lungs: CTA, no wheezing rales or rhonchi, high pressures on vent Cardiovascular: RRR, no m/r/g; no JVD, 2+ pitting edema Abdomen: distended but soft, TTP on right, +BS Extremities: moving all extremities  Neuro: following commands, answering yes or no questions, comprehension appears intact  Resolved Hospital Problem list     Assessment & Plan:    Acute Hypercapnic Respiratory Failure Secondary to COPD Exacerbation Hx of four admissions over the last year for COPD exacerbation although has no history of smoking per chart review, ?asthma but no bronchodilator  response on PFTs 09/2018. CXR with enlarged lungs and atelectasis. ABG 7.19 and CO2 88, currently breath stacking with plateau of 32 and peak 45 due to COPD exacerbation.  - PEEP 10, PC over PEEP 23, maintain pressure <30 - propofol for sedation, will need to consider paralytics if unable to decrease pressures  - repeat ABG  - steroids daily, q4h duonebs - switch abx to ceftriaxone/azithromycin  - levo as needed, maintain MAP  >65 - s/p 1L LR and 2L NS, d/c fluids  Hyponatremia Lower Extremity Edema Elevated BNP Decreased Pulse Pressures Last echo 2020 normal. BNP only slightly elevated at 280 possibly demand with decrease pulse pressures possibly due to breath stacking but will r/o HF.  - repeat Echo with above findings   Constipation RLQ pain  Right lower quadrant pain she endorses as chronic, some distention, soft, mildly tender on exam. No palpable liver. Endorses chronic constipation, last BM 11/4. Lactic acid normal, no metabolic acidosis - add LFTs  - miralax/docusate daily  - start tube feeds once stable  Elevated TSH Likely in setting of acute illness. Will add T4.    Best practice:  Diet: NPO, start tube feeds once stable Pain/Anxiety/Delirium protocol (if indicated): propofol VAP protocol (if indicated): ordered DVT prophylaxis: lovenox GI prophylaxis: pepcid Glucose control: no history of diabetes  Mobility: bedrest Code Status: full Family Communication: discussed with husband 11/6 Disposition: ICU   Labs   CBC: Recent Labs  Lab 02/25/20 1114  WBC 8.8  NEUTROABS 6.9  HGB 12.5  HCT 40.2  MCV 94.8  PLT 332    Basic Metabolic Panel: Recent Labs  Lab 02/25/20 1114  NA 132*  K 3.8  CL 84*  CO2 37*  GLUCOSE 113*  BUN 16  CREATININE 0.69  CALCIUM 9.5   GFR: Estimated Creatinine Clearance: 63.2 mL/min (by C-G formula based on SCr of 0.69 mg/dL). Recent Labs  Lab 02/25/20 1114 02/25/20 1115  WBC 8.8  --   LATICACIDVEN  --  1.3    Liver Function Tests: Recent Labs  Lab 02/25/20 1114  AST 26  ALT 13  ALKPHOS 51  BILITOT 0.6  PROT 7.9  ALBUMIN 3.8   No results for input(s): LIPASE, AMYLASE in the last 168 hours. No results for input(s): AMMONIA in the last 168 hours.  ABG    Component Value Date/Time   PHART 7.193 (LL) 02/25/2020 1122   PCO2ART 88.1 (HH) 02/25/2020 1122   PO2ART 371 (H) 02/25/2020 1122   HCO3 32.8 (H) 02/25/2020 1122   TCO2 >50 (H)  01/28/2020 1645   O2SAT 99.7 02/25/2020 1122     Coagulation Profile: No results for input(s): INR, PROTIME in the last 168 hours.  Cardiac Enzymes: No results for input(s): CKTOTAL, CKMB, CKMBINDEX, TROPONINI in the last 168 hours.  HbA1C: Hgb A1c MFr Bld  Date/Time Value Ref Range Status  12/22/2018 04:37 AM 5.5 4.8 - 5.6 % Final    Comment:    (NOTE) Pre diabetes:          5.7%-6.4% Diabetes:              >6.4% Glycemic control for   <7.0% adults with diabetes   03/10/2018 10:33 AM 5.6 4.8 - 5.6 % Final    Comment:             Prediabetes: 5.7 - 6.4          Diabetes: >6.4          Glycemic control for adults  with diabetes: <7.0     CBG: Recent Labs  Lab 02/25/20 1051  GLUCAP 105*    Review of Systems:   Unable to obtain complete ROS due to intubation, as noted in HPI   Past Medical History  She,  has a past medical history of Asthma, COPD (chronic obstructive pulmonary disease) (HCC), and Hypertension.   Surgical History    Past Surgical History:  Procedure Laterality Date  . WISDOM TOOTH EXTRACTION       Social History   reports that she has never smoked. She has never used smokeless tobacco. She reports current alcohol use of about 2.0 standard drinks of alcohol per week. She reports that she does not use drugs.   Family History   Her Family history is unknown by patient.   Allergies Allergies  Allergen Reactions  . Stiolto Respimat [Tiotropium Bromide-Olodaterol] Other (See Comments)    Severe headaches and rapid heartrate     Home Medications  Prior to Admission medications   Medication Sig Start Date End Date Taking? Authorizing Provider  acetaminophen (TYLENOL) 500 MG tablet Take 500 mg by mouth daily as needed for mild pain or headache.   Yes [provider]  albuterol (VENTOLIN HFA) 108 (90 Base) MCG/ACT inhaler Inhale into the lungs every 6 (six) hours as needed for wheezing or shortness of breath.   Yes [provider]    ALPRAZolam (XANAX) 0.25 MG tablet Take 1 tablet (0.25 mg total) by mouth 3 (three) times daily as needed for anxiety. Patient taking differently: Take 0.25 mg by mouth 3 (three) times daily as needed for anxiety (shingles pain).  01/02/20  Yes Sagardia, Eilleen KempfMiguel Jose, MD  alum & mag hydroxide-simeth (MAALOX/MYLANTA) 200-200-20 MG/5ML suspension Take 15 mLs by mouth every 6 (six) hours as needed for indigestion or heartburn. 11/12/19  Yes Shah, Pratik D, DO  amLODipine (NORVASC) 5 MG tablet Take 1 tablet (5 mg total) by mouth daily. 08/09/19 02/25/20 Yes SagardiaEilleen Kempf, Miguel Jose, MD  aspirin EC 81 MG EC tablet Take 1 tablet (81 mg total) by mouth daily. 04/17/19  Yes Calvert Cantorizwan, Saima, MD  bisoprolol (ZEBETA) 10 MG tablet Take 1 tablet (10 mg total) by mouth daily. 10/20/19  Yes Sagardia, Eilleen KempfMiguel Jose, MD  Budeson-Glycopyrrol-Formoterol (BREZTRI AEROSPHERE) 160-9-4.8 MCG/ACT AERO Inhale 2 puffs into the lungs in the morning and at bedtime. Patient taking differently: Inhale 1 puff into the lungs See admin instructions. Inhale 1 puff into the lungs when walking to bathroom - 6-7 times daily 12/02/19  Yes Glenford BayleyWalsh, Elizabeth W, NP  Dextromethorphan-guaiFENesin [facility](MUCINEX DM MAXIMUM STRENGTH) 60-1200 MG TB12 Take 1 tablet by mouth 2 (two) times daily as needed (congestion).   Yes [provider]  escitalopram (LEXAPRO) 20 MG tablet Take 1 tablet (20 mg total) by mouth daily. 01/02/20 04/01/20 Yes Sagardia, Eilleen KempfMiguel Jose, MD  famotidine (PEPCID) 10 MG tablet Take 10 mg by mouth 2 (two) times daily as needed for heartburn or indigestion (acid reflux).   Yes [provider]  levothyroxine (SYNTHROID) 50 MCG tablet Take 1 tablet (50 mcg total) by mouth daily at 6 (six) AM. 02/03/20  Yes Marinda ElkFeliz Ortiz, Abraham, MD  nitroGLYCERIN (NITROSTAT) 0.4 MG SL tablet PLACE 1 TABLET(0.4 MG TOTAL) UNDER THE TONGUE EVERY 5 MINUTES AS NEEDED FOR CHEST PAIN Patient taking differently: Place 0.4 mg under the tongue every 5 (five)  minutes as needed for chest pain.  01/10/20  Yes SagardiaEilleen Kempf, Miguel Jose, MD  OXYGEN Inhale 2 L into the lungs continuous.  Yes [provider]  pantoprazole (PROTONIX) 40 MG tablet Take 1 tablet (40 mg total) by mouth daily. 10/27/19  Yes Sagardia, Eilleen Kempf, MD  polyethylene glycol (MIRALAX / GLYCOLAX) 17 g packet Take 17 g by mouth daily as needed (constipation).   Yes [provider]  prednisoLONE acetate (PRED FORTE) 1 % ophthalmic suspension Place 1 drop into the left eye 4 (four) times daily. 01/10/20  Yes [provider]  rosuvastatin (CRESTOR) 40 MG tablet Take 1 tablet (40 mg total) by mouth daily. 01/25/20 01/19/21 Yes [name], MD  HYDROcodone-acetaminophen (NORCO) 5-325 MG tablet Take 1 tablet by mouth every 6 (six) hours as needed. Patient not taking: Reported on 02/08/2020 01/19/20   [name], MD  ipratropium-albuterol (DUONEB) 0.5-2.5 (3) MG/3ML SOLN Take 3 mLs by nebulization every 6 (six) hours as needed. Patient not taking: Reported on 02/08/2020 05/03/19   [name], NP  valACYclovir (VALTREX) 1000 MG tablet Take 1,000 mg by mouth daily. Patient not taking: Reported on 02/25/2020 01/10/20   [provider]

## 2020-02-25 NOTE — Progress Notes (Signed)
Kathleen Cameron [id number] Admission Data: 02/25/2020 7:18 PM Attending Provider: Cheri Fowler, MD  WGN:[id number], Eilleen Kempf, MD Consults/ Treatment Team:   [name] is a 54 y.o. female patient admitted from ED awake, alert  & orientated  X 3,  Prior, VSS - Blood pressure (!) 139/107, pulse 73, temperature 100 F (37.8 C), resp. rate (!) 26, height 5\' 4"  (1.626 m), weight 51 kg, SpO2 100 %., O2   On the ventilator.   no c/o shortness of breath, no c/o chest pain, no distress noted. Tele #  placed and pt is currently running:normal sinus rhythm.   IV site WDL:  antecubital right, condition patent and no redness and left, condition patent and no redness with a transparent dsg that's clean dry and intact.  A Pt orientation to unit, room and routine. Information packet given to patient/family and safety video watched.  Admission INP armband ID verified with patient/family, and in place. SR up x 2, fall risk assessment complete with Patient and family verbalizing understanding of risks associated with falls. Pt verbalizes an understanding of how to use the call bell and to call for help before getting out of bed.  Skin, clean-dry- intact without evidence of bruising, or skin tears.   No evidence of skin break down noted on exam.     Will cont to monitor and assist as needed.  [id number], [name] 02/25/2020 7:18 PM

## 2020-02-25 NOTE — Progress Notes (Signed)
ETT advanced 2 cm and secured at 25 at the lip based on CXR results.  Critical ABG results reported to [employer], Charity fundraiser.     PH  7.19, pCO2 88, PO2 371, HCO3- 33.    Fio2 decreased to .50 and RR increased to 30.  Will continue to monitor patient.

## 2020-02-25 NOTE — Progress Notes (Signed)
eLink Physician-Brief Progress Note Patient Name: Kathleen Cameron DOB: Feb 25, 1966 MRN: [id number]   Date of Service  02/25/2020  HPI/Events of Note  Patient admitted to the ICU with a foley catheter in place and bedside RN is asking for an order to continue it.  eICU Interventions  Order to continue Foley catheter entered.        [name] Braxston Quinter 02/25/2020, 9:40 PM

## 2020-02-25 NOTE — Code Documentation (Signed)
Intubation successful, 23 at the lip. +color change, bil breath sounds.

## 2020-02-25 NOTE — Code Documentation (Signed)
Levophed initiated at per md Verbal order

## 2020-02-26 ENCOUNTER — Inpatient Hospital Stay (HOSPITAL_COMMUNITY): Payer: PRIVATE HEALTH INSURANCE

## 2020-02-26 DIAGNOSIS — J9602 Acute respiratory failure with hypercapnia: Secondary | ICD-10-CM | POA: Diagnosis not present

## 2020-02-26 DIAGNOSIS — E861 Hypovolemia: Secondary | ICD-10-CM | POA: Diagnosis not present

## 2020-02-26 DIAGNOSIS — R0602 Shortness of breath: Secondary | ICD-10-CM

## 2020-02-26 DIAGNOSIS — I959 Hypotension, unspecified: Secondary | ICD-10-CM

## 2020-02-26 DIAGNOSIS — I9589 Other hypotension: Secondary | ICD-10-CM | POA: Diagnosis not present

## 2020-02-26 LAB — POCT I-STAT 7, (LYTES, BLD GAS, ICA,H+H)
Acid-Base Excess: 2 mmol/L (ref 0.0–2.0)
Bicarbonate: 26.1 mmol/L (ref 20.0–28.0)
Calcium, Ion: 1.19 mmol/L (ref 1.15–1.40)
HCT: 27 % — ABNORMAL LOW (ref 36.0–46.0)
Hemoglobin: 9.2 g/dL — ABNORMAL LOW (ref 12.0–15.0)
O2 Saturation: 98 %
Patient temperature: 98.4
Potassium: 3.8 mmol/L (ref 3.5–5.1)
Sodium: 137 mmol/L (ref 135–145)
TCO2: 27 mmol/L (ref 22–32)
pCO2 arterial: 36.4 mmHg (ref 32.0–48.0)
pH, Arterial: 7.463 — ABNORMAL HIGH (ref 7.350–7.450)
pO2, Arterial: 100 mmHg (ref 83.0–108.0)

## 2020-02-26 LAB — POCT I-STAT EG7
Acid-Base Excess: 3 mmol/L — ABNORMAL HIGH (ref 0.0–2.0)
Bicarbonate: 27.3 mmol/L (ref 20.0–28.0)
Calcium, Ion: 1.16 mmol/L (ref 1.15–1.40)
HCT: 31 % — ABNORMAL LOW (ref 36.0–46.0)
Hemoglobin: 10.5 g/dL — ABNORMAL LOW (ref 12.0–15.0)
O2 Saturation: 70 %
Patient temperature: 97.6
Potassium: 3.4 mmol/L — ABNORMAL LOW (ref 3.5–5.1)
Sodium: 137 mmol/L (ref 135–145)
TCO2: 29 mmol/L (ref 22–32)
pCO2, Ven: 40 mmHg — ABNORMAL LOW (ref 44.0–60.0)
pH, Ven: 7.441 — ABNORMAL HIGH (ref 7.250–7.430)
pO2, Ven: 34 mmHg (ref 32.0–45.0)

## 2020-02-26 LAB — CBC
HCT: 31 % — ABNORMAL LOW (ref 36.0–46.0)
Hemoglobin: 10.3 g/dL — ABNORMAL LOW (ref 12.0–15.0)
MCH: 30 pg (ref 26.0–34.0)
MCHC: 33.2 g/dL (ref 30.0–36.0)
MCV: 90.4 fL (ref 80.0–100.0)
Platelets: 407 10*3/uL — ABNORMAL HIGH (ref 150–400)
RBC: 3.43 MIL/uL — ABNORMAL LOW (ref 3.87–5.11)
RDW: 14.6 % (ref 11.5–15.5)
WBC: 10.1 10*3/uL (ref 4.0–10.5)
nRBC: 0 % (ref 0.0–0.2)

## 2020-02-26 LAB — GLUCOSE, CAPILLARY
Glucose-Capillary: 131 mg/dL — ABNORMAL HIGH (ref 70–99)
Glucose-Capillary: 145 mg/dL — ABNORMAL HIGH (ref 70–99)
Glucose-Capillary: 152 mg/dL — ABNORMAL HIGH (ref 70–99)
Glucose-Capillary: 165 mg/dL — ABNORMAL HIGH (ref 70–99)
Glucose-Capillary: 167 mg/dL — ABNORMAL HIGH (ref 70–99)
Glucose-Capillary: 168 mg/dL — ABNORMAL HIGH (ref 70–99)

## 2020-02-26 LAB — BASIC METABOLIC PANEL
Anion gap: 12 (ref 5–15)
BUN: 28 mg/dL — ABNORMAL HIGH (ref 6–20)
CO2: 24 mmol/L (ref 22–32)
Calcium: 8.7 mg/dL — ABNORMAL LOW (ref 8.9–10.3)
Chloride: 98 mmol/L (ref 98–111)
Creatinine, Ser: 1.4 mg/dL — ABNORMAL HIGH (ref 0.44–1.00)
GFR, Estimated: 44 mL/min — ABNORMAL LOW (ref >60)
Glucose, Bld: 178 mg/dL — ABNORMAL HIGH (ref 70–99)
Potassium: 3.8 mmol/L (ref 3.5–5.1)
Sodium: 134 mmol/L — ABNORMAL LOW (ref 135–145)

## 2020-02-26 LAB — MAGNESIUM
Magnesium: 2.2 mg/dL (ref 1.7–2.4)
Magnesium: 2 mg/dL (ref 1.7–2.4)
Magnesium: 2 mg/dL (ref 1.7–2.4)

## 2020-02-26 LAB — PHOSPHORUS
Phosphorus: 3.1 mg/dL (ref 2.5–4.6)
Phosphorus: 2.9 mg/dL (ref 2.5–4.6)

## 2020-02-26 LAB — ECHOCARDIOGRAM COMPLETE
Area-P 1/2: 8.25 cm2
Height: 64 in
S' Lateral: 2.3 cm
Single Plane A4C EF: 69.8 %
Weight: 1798.95 oz

## 2020-02-26 LAB — HEMOGLOBIN A1C
Hgb A1c MFr Bld: 5.4 % (ref 4.8–5.6)
Mean Plasma Glucose: 108.28 mg/dL

## 2020-02-26 MED ORDER — FUROSEMIDE 10 MG/ML IJ SOLN
20.0000 mg | Freq: Once | INTRAMUSCULAR | Status: AC
  Administered 2020-02-26: 20 mg via INTRAVENOUS
  Filled 2020-02-26: qty 2

## 2020-02-26 MED ORDER — LORAZEPAM 2 MG/ML IJ SOLN
2.0000 mg | Freq: Once | INTRAMUSCULAR | Status: AC
  Administered 2020-02-26: 2 mg via INTRAVENOUS

## 2020-02-26 MED ORDER — VITAL HIGH PROTEIN PO LIQD
1000.0000 mL | ORAL | Status: DC
  Administered 2020-02-26: 1000 mL

## 2020-02-26 MED ORDER — MIDAZOLAM HCL 2 MG/2ML IJ SOLN
5.0000 mg | INTRAMUSCULAR | Status: DC | PRN
  Administered 2020-02-26 – 2020-02-27 (×3): 5 mg via INTRAVENOUS
  Filled 2020-02-26 (×3): qty 6

## 2020-02-26 MED ORDER — LACTATED RINGERS IV BOLUS
1000.0000 mL | Freq: Once | INTRAVENOUS | Status: AC
  Administered 2020-02-26: 1000 mL via INTRAVENOUS

## 2020-02-26 MED ORDER — LORAZEPAM 2 MG/ML IJ SOLN
INTRAMUSCULAR | Status: AC
  Filled 2020-02-26: qty 1

## 2020-02-26 MED ORDER — POLYETHYLENE GLYCOL 3350 17 G PO PACK
17.0000 g | PACK | Freq: Every day | ORAL | Status: DC
  Administered 2020-02-26 – 2020-02-28 (×3): 17 g
  Filled 2020-02-26 (×2): qty 1

## 2020-02-26 MED ORDER — POTASSIUM CHLORIDE 20 MEQ PO PACK
40.0000 meq | PACK | Freq: Once | ORAL | Status: AC
  Administered 2020-02-26: 40 meq
  Filled 2020-02-26 (×2): qty 2

## 2020-02-26 MED ORDER — DOCUSATE SODIUM 50 MG/5ML PO LIQD
100.0000 mg | Freq: Two times a day (BID) | ORAL | Status: DC
  Administered 2020-02-26 – 2020-02-29 (×7): 100 mg
  Filled 2020-02-26 (×6): qty 10

## 2020-02-26 MED ORDER — RACEPINEPHRINE HCL 2.25 % IN NEBU
0.5000 mL | INHALATION_SOLUTION | Freq: Once | RESPIRATORY_TRACT | Status: AC
  Administered 2020-02-26: 0.5 mL via RESPIRATORY_TRACT
  Filled 2020-02-26: qty 0.5

## 2020-02-26 MED ORDER — ONDANSETRON HCL 4 MG/2ML IJ SOLN: 4.0000 mg | Freq: Four times a day (QID) | INTRAMUSCULAR | Status: DC | PRN

## 2020-02-26 MED ORDER — PROSOURCE TF PO LIQD
45.0000 mL | Freq: Two times a day (BID) | ORAL | Status: DC
  Administered 2020-02-26 – 2020-02-27 (×3): 45 mL
  Filled 2020-02-26 (×3): qty 45

## 2020-02-26 MED ORDER — SODIUM CHLORIDE 0.9 % IV SOLN: INTRAVENOUS | Status: DC | PRN

## 2020-02-26 MED ORDER — INSULIN ASPART 100 UNIT/ML ~~LOC~~ SOLN
0.0000 [IU] | SUBCUTANEOUS | Status: DC
  Administered 2020-02-26: 2 [IU] via SUBCUTANEOUS
  Administered 2020-02-26 (×2): 3 [IU] via SUBCUTANEOUS
  Administered 2020-02-27 (×2): 2 [IU] via SUBCUTANEOUS
  Administered 2020-02-27: 3 [IU] via SUBCUTANEOUS
  Administered 2020-02-28: 2 [IU] via SUBCUTANEOUS
  Administered 2020-02-28 (×2): 3 [IU] via SUBCUTANEOUS
  Administered 2020-02-28 – 2020-02-29 (×2): 2 [IU] via SUBCUTANEOUS
  Administered 2020-03-03: 3 [IU] via SUBCUTANEOUS

## 2020-02-26 MED ORDER — PERFLUTREN LIPID MICROSPHERE
1.0000 mL | INTRAVENOUS | Status: AC | PRN
  Administered 2020-02-26: 2 mL via INTRAVENOUS
  Filled 2020-02-26: qty 10

## 2020-02-26 NOTE — Progress Notes (Signed)
eLink Physician-Brief Progress Note Patient Name: Kathleen Cameron DOB: December 15, 1965 MRN: [id number]   Date of Service  02/26/2020  HPI/Events of Note  Patient is on a Phenylephrine infusion and needs an arterial line for closer blood pressure monitoring.  eICU Interventions  Arterial line ordered.        [name] [name] 02/26/2020, 12:37 AM

## 2020-02-26 NOTE — Progress Notes (Signed)
Upon shift assessment, intermittent twitches were noted on all extremities. Once this RN applied pressure while dorsiflexing the patient's foot, the foot started rhythmically twitching. During these twitches, the patient's blood pressure decreases. Pupils are fixed in an upward gaze. Elink notified about concerns. No new orders at this time.

## 2020-02-26 NOTE — Progress Notes (Signed)
eLink Physician-Brief Progress Note Patient Name: Kathleen Cameron DOB: [birth year] MRN: [id number]   Date of Service  02/26/2020  HPI/Events of Note  Patient intubated and sedated on the ventilator.  eICU Interventions  No intervention.        [name] [name] 02/26/2020, 9:08 PM

## 2020-02-26 NOTE — Progress Notes (Signed)
eLink Physician-Brief Progress Note Patient Name: Kathleen Cameron DOB: [birth year] MRN: [id number]   Date of Service  02/26/2020  HPI/Events of Note  Patient with Oliguria. CVP 14.  eICU Interventions  Lasix 20 mg iv x 1.        [name] Alichia Alridge 02/26/2020, 2:49 AM

## 2020-02-26 NOTE — Progress Notes (Signed)
  Echocardiogram 2D Echocardiogram has been performed.  Kathleen Cameron 02/26/2020, 8:32 AM

## 2020-02-26 NOTE — Progress Notes (Signed)
Pharmacy Electrolyte Replacement  Recent Labs:  Recent Labs    02/26/20 0357 02/26/20 0357 02/26/20 0831  K 3.8   < > 3.4*  MG 2.2  --   --   CREATININE 1.40*  --   --    < > = values in this interval not displayed.    Low Critical Values (K </= 2.5, Phos </= 1, Mg </= 1) Present: None  Plan:  K 40 per tube x 1  Elmer Sow, PharmD, BCPS, BCCCP Clinical Pharmacist [phone]  Please check AMION for all [facility] Pharmacy numbers  02/26/2020 8:47 AM

## 2020-02-26 NOTE — H&P (Signed)
NAME:  Kathleen Cameron, MRN:  [id number], DOB:  01/04/1966, LOS: 1 ADMISSION DATE:  02/25/2020, CONSULTATION DATE:  02/25/2020 REFERRING MD:  Vanetta MuldersScott Zackowski, MD, CHIEF COMPLAINT:  SOB  Brief History   54yo female with history of COPD presented with shortness of breath, intubated in ED.   History of present illness   54yo female with PMH COPD (no history of smoking with 4 admissions for exacerbation over the last year on supplemental 2L at home, hypertension who presented via EMS in respiratory distress and hypercapnic respiratory failure requiring intubation.   Patient awake off sedation at bedside due to earlier hypotension. She had increased shortness of breath this morning, she denies chest pain. She endorses right abdominal pain, which she states is chronic. She also has chronic constipation with last BM three days ago. She has not noted any blood in her stool.   Discussed with husband over the phone. He states she has a lot of anxiety at baseline and is not sleeping at night, which he thinks led to this. She had sudden SOB this morning and was desatting to 35%, although unclear if perhaps he meant respirations, and he tried to turn up her home supplemental oxygen but she only got progressively worst. He states she has not had recent cough, sick contact, fever that he knows of. She has been using her inhalers. She chronically has shortness of breath and can only walk to and from the bathroom. He states she has had increased abdominal discomfort after eating recently but that this is ongoing.   In the the ER she was intubated with initial propofol leading to drop in blood pressure, which was stopped. She was started on levophed as well and broad spectrum antibiotics.   Past Medical History  COPD Asthma Hypertension    Significant Hospital Events   ETT 11/6 >>   Consults:    Procedures:  ETT 11/6 >>  Significant Diagnostic Tests:  CXR 11/6 >> enlarged lung volumes, atelectasis    10/14/2018-pulmonary function test-FVC 1.08 (45% predicted) postbronchodilator ratio 57, postbronchodilator FEV1 0.56 (29% predicted), no bronchodilator response   Micro Data:  Blood culture 11/6 >>   Antimicrobials:  Vanc 11/6 Cefepime 11/6 Metro 11/6 Azithro 11/6 >> Ceftriaxone 11/6 >>  Interim history/subjective:  Hypotensive on pressors. Auto PEEP to 20, decreased RR. Bps getting better weaning NE.   Objective   Blood pressure (!) 158/110, pulse 84, temperature 97.9 F (36.6 C), resp. rate (!) 24, height 5\' 4"  (1.626 m), weight 51 kg, SpO2 100 %.    Vent Mode: PCV FiO2 (%):  [30 %-60 %] 30 % Set Rate:  [30 bmp] 30 bmp Vt Set:  [389 mL] 389 mL PEEP:  [10 cmH20] 10 cmH20 Plateau Pressure:  [30 cmH20-33 cmH20] 33 cmH20   Intake/Output Summary (Last 24 hours) at 02/26/2020 1211 Last data filed at 02/26/2020 1152 Gross per 24 hour  Intake 2389.51 ml  Output 515 ml  Net 1874.51 ml   Filed Weights   02/25/20 1043  Weight: 51 kg    Examination: General: acutely ill appearing female, thin, intubated, awake HENT: [place]/AT, ETT in place, EOM intact Lungs: CTA, no wheezing rales or rhonchi, high pressures on vent Cardiovascular: RRR, no m/r/g; no JVD, 2+ pitting edema Abdomen: distended but soft, TTP on right, +BS Extremities: moving all extremities  Neuro: following commands, answering yes or no questions, comprehension appears intact  Resolved Hospital Problem list     Assessment & Plan:    Acute Hypercapnic  Respiratory Failure Secondary to COPD Exacerbation Hx of four admissions over the last year for COPD exacerbation although has no history of smoking per chart review, ?asthma but no bronchodilator response on PFTs 09/2018. CXR with enlarged lungs and atelectasis. Imaging and mechanics on vent suggest obstructive lung disease.  - PEEP 5, PC over PEEP 23, maintain pressure <30 - propofol for sedation, will need to consider paralytics if unable to decrease pressures   - repeat ABG  - steroids daily, q4h duonebs - switch abx to ceftriaxone/azithromycin  - levo as needed, maintain MAP >65  Shock: Suspect hypovolemia given sensitivity to sedation. Pulse pressure variation on A-line. No source of infection. Responded to 1L LR bolus this AM. --IVF as needed, cautious with LE edema  Hyponatremia Lower Extremity Edema Elevated BNP Decreased Pulse Pressures Last echo 2020 normal. BNP only slightly elevated at 280 possibly demand with decrease pulse pressures possibly due to breath stacking but will r/o HF.  - repeat Echo with above findings   Constipation RLQ pain  Right lower quadrant pain she endorses as chronic, some distention, soft, mildly tender on exam. No palpable liver. Endorses chronic constipation, last BM 11/4. Lactic acid normal, no metabolic acidosis - miralax/docusate daily  - Tube feeds  Elevated TSH Likely in setting of acute illness. Will add T4.    Best practice:  Diet: start tube feeds  Pain/Anxiety/Delirium protocol (if indicated): propofol VAP protocol (if indicated): ordered DVT prophylaxis: lovenox GI prophylaxis: pepcid Glucose control: no history of diabetes  Mobility: bedrest Code Status: full Family Communication: discussed with husband 11/7 Disposition: ICU   Labs   CBC: Recent Labs  Lab 02/25/20 1114 02/26/20 0357 02/26/20 0831  WBC 8.8 10.1  --   NEUTROABS 6.9  --   --   HGB 12.5 10.3* 10.5*  HCT 40.2 31.0* 31.0*  MCV 94.8 90.4  --   PLT 332 407*  --     Basic Metabolic Panel: Recent Labs  Lab 02/25/20 1114 02/26/20 0357 02/26/20 0831 02/26/20 1047  NA 132* 134* 137  --   K 3.8 3.8 3.4*  --   CL 84* 98  --   --   CO2 37* 24  --   --   GLUCOSE 113* 178*  --   --   BUN 16 28*  --   --   CREATININE 0.69 1.40*  --   --   CALCIUM 9.5 8.7*  --   --   MG  --  2.2  --  2.0  PHOS  --   --   --  3.1   GFR: Estimated Creatinine Clearance: 36.1 mL/min (A) (by C-G formula based on SCr of 1.4 mg/dL  (H)). Recent Labs  Lab 02/25/20 1114 02/25/20 1115 02/26/20 0357  WBC 8.8  --  10.1  LATICACIDVEN  --  1.3  --     Liver Function Tests: Recent Labs  Lab 02/25/20 1114 02/25/20 2149  AST 26 26  ALT 13 13  ALKPHOS 51 56  BILITOT 0.6 0.4  PROT 7.9 8.2*  ALBUMIN 3.8 3.9   No results for input(s): LIPASE, AMYLASE in the last 168 hours. No results for input(s): AMMONIA in the last 168 hours.  ABG    Component Value Date/Time   PHART 7.318 (L) 02/25/2020 1430   PCO2ART 63.5 (H) 02/25/2020 1430   PO2ART 159 (H) 02/25/2020 1430   HCO3 27.3 02/26/2020 0831   TCO2 29 02/26/2020 0831   O2SAT 70.0 02/26/2020 0831  Coagulation Profile: No results for input(s): INR, PROTIME in the last 168 hours.  Cardiac Enzymes: No results for input(s): CKTOTAL, CKMB, CKMBINDEX, TROPONINI in the last 168 hours.  HbA1C: Hgb A1c MFr Bld  Date/Time Value Ref Range Status  12/22/2018 04:37 AM 5.5 4.8 - 5.6 % Final    Comment:    (NOTE) Pre diabetes:          5.7%-6.4% Diabetes:              >6.4% Glycemic control for   <7.0% adults with diabetes   03/10/2018 10:33 AM 5.6 4.8 - 5.6 % Final    Comment:             Prediabetes: 5.7 - 6.4          Diabetes: >6.4          Glycemic control for adults with diabetes: <7.0     CBG: Recent Labs  Lab 02/25/20 1505 02/25/20 1940 02/25/20 2321 02/26/20 0401 02/26/20 0829  GLUCAP 174* 154* 162* 167* 145*    Review of Systems:   Unable to obtain complete ROS due to intubation, as noted in HPI   Past Medical History  She,  has a past medical history of Asthma, COPD (chronic obstructive pulmonary disease) (HCC), and Hypertension.   Surgical History    Past Surgical History:  Procedure Laterality Date   WISDOM TOOTH EXTRACTION       Social History   reports that she has never smoked. She has never used smokeless tobacco. She reports current alcohol use of about 2.0 standard drinks of alcohol per week. She reports that she does  not use drugs.   Family History   Her Family history is unknown by patient.   Allergies Allergies  Allergen Reactions   Stiolto Respimat [Tiotropium Bromide-Olodaterol] Other (See Comments)    Severe headaches and rapid heartrate     Home Medications  Prior to Admission medications   Medication Sig Start Date End Date Taking? Authorizing Provider  acetaminophen (TYLENOL) 500 MG tablet Take 500 mg by mouth daily as needed for mild pain or headache.   Yes [provider]  albuterol (VENTOLIN HFA) 108 (90 Base) MCG/ACT inhaler Inhale into the lungs every 6 (six) hours as needed for wheezing or shortness of breath.   Yes [provider]  ALPRAZolam (XANAX) 0.25 MG tablet Take 1 tablet (0.25 mg total) by mouth 3 (three) times daily as needed for anxiety. Patient taking differently: Take 0.25 mg by mouth 3 (three) times daily as needed for anxiety (shingles pain).  01/02/20  Yes Sagardia, Eilleen Kempf, MD  alum & mag hydroxide-simeth (MAALOX/MYLANTA) 200-200-20 MG/5ML suspension Take 15 mLs by mouth every 6 (six) hours as needed for indigestion or heartburn. 11/12/19  Yes Shah, Pratik D, DO  amLODipine (NORVASC) 5 MG tablet Take 1 tablet (5 mg total) by mouth daily. 08/09/19 02/25/20 Yes SagardiaEilleen Kempf, MD  aspirin EC 81 MG EC tablet Take 1 tablet (81 mg total) by mouth daily. 04/17/19  Yes [name], MD  bisoprolol (ZEBETA) 10 MG tablet Take 1 tablet (10 mg total) by mouth daily. 10/20/19  Yes Sagardia, Eilleen Kempf, MD  Budeson-Glycopyrrol-Formoterol (BREZTRI AEROSPHERE) 160-9-4.8 MCG/ACT AERO Inhale 2 puffs into the lungs in the morning and at bedtime. Patient taking differently: Inhale 1 puff into the lungs See admin instructions. Inhale 1 puff into the lungs when walking to bathroom - 6-7 times daily 12/02/19  Yes [name], NP  Dextromethorphan-guaiFENesin (  MUCINEX DM MAXIMUM STRENGTH) 60-1200 MG TB12 Take 1 tablet by mouth 2 (two) times daily as needed  (congestion).   Yes [provider]  escitalopram (LEXAPRO) 20 MG tablet Take 1 tablet (20 mg total) by mouth daily. 01/02/20 04/01/20 Yes Sagardia, Eilleen Kempf, MD  famotidine (PEPCID) 10 MG tablet Take 10 mg by mouth 2 (two) times daily as needed for heartburn or indigestion (acid reflux).   Yes [provider]  levothyroxine (SYNTHROID) 50 MCG tablet Take 1 tablet (50 mcg total) by mouth daily at 6 (six) AM. 02/03/20  Yes Marinda Elk, MD  nitroGLYCERIN (NITROSTAT) 0.4 MG SL tablet PLACE 1 TABLET(0.4 MG TOTAL) UNDER THE TONGUE EVERY 5 MINUTES AS NEEDED FOR CHEST PAIN Patient taking differently: Place 0.4 mg under the tongue every 5 (five) minutes as needed for chest pain.  01/10/20  Yes SagardiaEilleen Kempf, MD  OXYGEN Inhale 2 L into the lungs continuous.   Yes [provider]  pantoprazole (PROTONIX) 40 MG tablet Take 1 tablet (40 mg total) by mouth daily. 10/27/19  Yes Sagardia, Eilleen Kempf, MD  polyethylene glycol (MIRALAX / GLYCOLAX) 17 g packet Take 17 g by mouth daily as needed (constipation).   Yes [provider]  prednisoLONE acetate (PRED FORTE) 1 % ophthalmic suspension Place 1 drop into the left eye 4 (four) times daily. 01/10/20  Yes [provider]  rosuvastatin (CRESTOR) 40 MG tablet Take 1 tablet (40 mg total) by mouth daily. 01/25/20 01/19/21 Yes [name], MD  HYDROcodone-acetaminophen (NORCO) 5-325 MG tablet Take 1 tablet by mouth every 6 (six) hours as needed. Patient not taking: Reported on 02/08/2020 01/19/20   [name], MD  ipratropium-albuterol (DUONEB) 0.5-2.5 (3) MG/3ML SOLN Take 3 mLs by nebulization every 6 (six) hours as needed. Patient not taking: Reported on 02/08/2020 05/03/19   [name], NP  valACYclovir (VALTREX) 1000 MG tablet Take 1,000 mg by mouth daily. Patient not taking: Reported on 02/25/2020 01/10/20   [provider]     CRITICAL CARE Performed by: Lesia Sago  Ligaya Cormier   Total critical care time: 40 minutes  Critical care time was exclusive of separately billable procedures and treating other patients.  Critical care was necessary to treat or prevent imminent or life-threatening deterioration.  Critical care was time spent personally by me on the following activities: development of treatment plan with patient and/or surrogate as well as nursing, discussions with consultants, evaluation of patient's response to treatment, examination of patient, obtaining history from patient or surrogate, ordering and performing treatments and interventions, ordering and review of laboratory studies, ordering and review of radiographic studies, pulse oximetry and re-evaluation of patient's condition.

## 2020-02-26 NOTE — Procedures (Signed)
Arterial Catheter Insertion Procedure Note  Kathleen Cameron  [id number]  1965/07/23  Date:02/26/20  Time:3:53 AM    Provider Performing: Darcella Gasman Milda Lindvall    Procedure: Insertion of Arterial Line (03833) with US guidance (38329)   Indication(s) Blood pressure monitoring and/or need for frequent ABGs  Consent Unable to obtain consent due to emergent nature of procedure.  Anesthesia None   Time Out Verified patient identification, verified procedure, site/side was marked, verified correct patient position, special equipment/implants available, medications/allergies/relevant history reviewed, required imaging and test results available.   Sterile Technique Maximal sterile technique including full sterile barrier drape, hand hygiene, sterile gown, sterile gloves, mask, hair covering, sterile ultrasound probe cover (if used).   Procedure Description Area of catheter insertion was cleaned with chlorhexidine and draped in sterile fashion. With real-time ultrasound guidance an arterial catheter was placed into the right radial artery.  Appropriate arterial tracings confirmed on monitor.     Complications/Tolerance None; patient tolerated the procedure well.   EBL Minimal   Specimen(s) None        Darcella Gasman Wilkins Elpers, PA-C

## 2020-02-27 ENCOUNTER — Inpatient Hospital Stay (HOSPITAL_COMMUNITY): Payer: PRIVATE HEALTH INSURANCE

## 2020-02-27 DIAGNOSIS — J9601 Acute respiratory failure with hypoxia: Secondary | ICD-10-CM

## 2020-02-27 DIAGNOSIS — E440 Moderate protein-calorie malnutrition: Secondary | ICD-10-CM | POA: Insufficient documentation

## 2020-02-27 DIAGNOSIS — J9602 Acute respiratory failure with hypercapnia: Secondary | ICD-10-CM | POA: Diagnosis not present

## 2020-02-27 DIAGNOSIS — R569 Unspecified convulsions: Secondary | ICD-10-CM | POA: Diagnosis not present

## 2020-02-27 LAB — GLUCOSE, CAPILLARY
Glucose-Capillary: 107 mg/dL — ABNORMAL HIGH (ref 70–99)
Glucose-Capillary: 111 mg/dL — ABNORMAL HIGH (ref 70–99)
Glucose-Capillary: 117 mg/dL — ABNORMAL HIGH (ref 70–99)
Glucose-Capillary: 141 mg/dL — ABNORMAL HIGH (ref 70–99)
Glucose-Capillary: 150 mg/dL — ABNORMAL HIGH (ref 70–99)
Glucose-Capillary: 165 mg/dL — ABNORMAL HIGH (ref 70–99)

## 2020-02-27 LAB — MAGNESIUM
Magnesium: 2.1 mg/dL (ref 1.7–2.4)
Magnesium: 2 mg/dL (ref 1.7–2.4)

## 2020-02-27 LAB — PHOSPHORUS
Phosphorus: 2.4 mg/dL — ABNORMAL LOW (ref 2.5–4.6)
Phosphorus: 2.5 mg/dL (ref 2.5–4.6)

## 2020-02-27 MED ORDER — LEVOTHYROXINE SODIUM 25 MCG PO TABS
50.0000 ug | ORAL_TABLET | Freq: Every day | ORAL | Status: DC
  Administered 2020-02-28: 50 ug via ORAL
  Filled 2020-02-27: qty 2

## 2020-02-27 MED ORDER — IPRATROPIUM-ALBUTEROL 0.5-2.5 (3) MG/3ML IN SOLN
3.0000 mL | Freq: Three times a day (TID) | RESPIRATORY_TRACT | Status: DC
  Administered 2020-02-28 – 2020-03-01 (×9): 3 mL via RESPIRATORY_TRACT
  Filled 2020-02-27 (×9): qty 3

## 2020-02-27 MED ORDER — FENTANYL CITRATE (PF) 100 MCG/2ML IJ SOLN
25.0000 ug | INTRAMUSCULAR | Status: DC | PRN
  Administered 2020-02-27 – 2020-03-07 (×8): 25 ug via INTRAVENOUS
  Filled 2020-02-27 (×7): qty 2

## 2020-02-27 MED ORDER — MIDAZOLAM HCL 2 MG/2ML IJ SOLN
2.0000 mg | INTRAMUSCULAR | Status: DC | PRN
  Administered 2020-02-27 – 2020-03-07 (×7): 2 mg via INTRAVENOUS
  Filled 2020-02-27 (×7): qty 2

## 2020-02-27 MED ORDER — FUROSEMIDE 10 MG/ML IJ SOLN
40.0000 mg | Freq: Once | INTRAMUSCULAR | Status: AC
  Administered 2020-02-27: 40 mg via INTRAVENOUS
  Filled 2020-02-27: qty 4

## 2020-02-27 MED ORDER — FAMOTIDINE IN NACL 20-0.9 MG/50ML-% IV SOLN
20.0000 mg | INTRAVENOUS | Status: DC
  Administered 2020-02-28: 20 mg via INTRAVENOUS
  Filled 2020-02-27: qty 50

## 2020-02-27 MED ORDER — VITAL AF 1.2 CAL PO LIQD
1000.0000 mL | ORAL | Status: DC
  Administered 2020-02-27 – 2020-02-28 (×2): 1000 mL
  Filled 2020-02-27: qty 1000

## 2020-02-27 NOTE — Progress Notes (Signed)
EEG complete - results pending 

## 2020-02-27 NOTE — Procedures (Addendum)
Patient Name: Kathleen Cameron  MRN: [id number]  Epilepsy Attending: Charlsie Quest  Referring Physician/Provider: Dr. Briant Sites  Date: 02/27/2020 Duration: 28.23 mins  Patient history: 54 year old female with seizure-like episode.  EEG evaluate for seizures.  Level of alertness: Awake, asleep  AEDs during EEG study: Versed  Technical aspects: This EEG study was done with scalp electrodes positioned according to the 10-20 International system of electrode placement. Electrical activity was acquired at a sampling rate of 500Hz  and reviewed with a high frequency filter of 70Hz  and a low frequency filter of 1Hz . EEG data were recorded continuously and digitally stored.   Description: No clear posterior dominant rhythm was seen.  Sleep was characterized by vertex waves, sleep spindles (12 to 14 Hz), maximal frontocentral region.  EEG showed continuous generalized 3 to 5 Hz theta-delta slowing.  Hyperventilation and photic stimulation were not performed.     ABNORMALITY -Continuous slow, generalized  IMPRESSION: This study is suggestive of severe diffuse encephalopathy, nonspecific to etiology.  No seizures or epileptiform discharges were seen throughout the recording.  Royce Stegman 

## 2020-02-27 NOTE — Progress Notes (Signed)
RT in for routine vent check. Pt initiating breaths over set RR. Pt responds to voice. Pt placed on CPAP/PS to which pt went apneic. Explained to pt that she needs to breathe on her own and she shook her head no at this RT. Pt returned to full support at this time due to no effort from pt. RT will continue to monitor.

## 2020-02-27 NOTE — Progress Notes (Signed)
Initial Nutrition Assessment  DOCUMENTATION CODES:   Non-severe (moderate) malnutrition in context of chronic illness  INTERVENTION:   Tube feeding via OG tube: - Change to Vital AF 1.2 @ 50 ml/hr (1200 ml/day)  Tube feeding regimen provides 1440 kcal, 90 grams of protein, and 973 ml of H2O.   NUTRITION DIAGNOSIS:   Moderate Malnutrition related to chronic illness (COPD) as evidenced by mild fat depletion, moderate muscle depletion, severe muscle depletion.  GOAL:   Patient will meet greater than or equal to 90% of their needs  MONITOR:   Vent status, Labs, Weight trends, TF tolerance, I & O's  REASON FOR ASSESSMENT:   Ventilator, Consult Enteral/tube feeding initiation and management  ASSESSMENT:   54 year old female who presented to the ED on 11/06 with respiratory distress. PMH of COPD, HTN, asthma. Pt required intubation in the ED.   Consult received for tube feeding intiation and management. OGT in stomach per x-ray.  Weight currently 55.2 kg which is up from 51 kg on admission. Suspect weight gain related to positive fluid balance. Will utilize 51 kg as EDW.  Unable to obtain diet and weight history from pt at this time. Reviewed weight history in chart. Weight has fluctuated between 47-53 kg over the last year.  Current TF: Vital High Protein @ 40 ml/hr, ProSource TF 45 ml BID  Patient is currently intubated on ventilator support MV: 9.8 L/min Temp (24hrs), Avg:99.1 F (37.3 C), Min:97.9 F (36.6 C), Max:100 F (37.8 C) BP (a-line): 144/84 MAP (a-line): 107  Drips: Fentanyl Levophed  Medications reviewed and include: colace, SSI q 4 hours, solu-medrol, miralax, IV abx, IV pepcid  Labs reviewed: phosphorus 2.4, hemoglobin 9.2 CBG's: 107-168 x 24 hours  UOP: 444 ml x 24 hours I/O's: +3.8 L since admit  NUTRITION - FOCUSED PHYSICAL EXAM:    Most Recent Value  Orbital Region Mild depletion  Upper Arm Region Mild depletion  Thoracic and Lumbar  Region Mild depletion  Buccal Region Unable to assess  Temple Region Mild depletion  Clavicle Bone Region Moderate depletion  Clavicle and Acromion Bone Region Moderate depletion  Scapular Bone Region Unable to assess  Dorsal Hand Unable to assess  Patellar Region Moderate depletion  Anterior Thigh Region Severe depletion  Posterior Calf Region Severe depletion  Edema (RD Assessment) None  Hair Reviewed  Eyes Reviewed  Mouth Unable to assess  Skin Reviewed  Nails Reviewed       Diet Order:   Diet Order            Diet NPO time specified  Diet effective now                 EDUCATION NEEDS:   No education needs have been identified at this time  Skin:  Skin Assessment: Reviewed RN Assessment  Last BM:  no documented BM  Height:   Ht Readings from Last 1 Encounters:  02/25/20 5\' 4"  (1.626 m)    Weight:   Wt Readings from Last 1 Encounters:  02/27/20 55.2 kg    Ideal Body Weight:  54.5 kg  BMI:  Body mass index is 20.89 kg/m.  Estimated Nutritional Needs:   Kcal:  1450  Protein:  75-90 grams  Fluid:  >/= 1.5 L    13/08/21, MS, RD, LDN Inpatient Clinical Dietitian Please see [facility] for contact information.

## 2020-02-27 NOTE — Progress Notes (Signed)
NAME:  Kathleen Cameron, MRN:  [id number], DOB:  02/19/1966, LOS: 2 ADMISSION DATE:  02/25/2020, CONSULTATION DATE:  02/25/2020 REFERRING MD:  Vanetta Mulders, MD, CHIEF COMPLAINT:  SOB  Brief History   54yo female with history of COPD presented with shortness of breath, intubated in ED.   History of present illness   54yo female with PMH COPD (no history of smoking with 4 admissions for exacerbation over the last year on supplemental 2L at home, hypertension who presented via EMS in respiratory distress and hypercapnic respiratory failure requiring intubation.   Patient awake off sedation at bedside due to earlier hypotension. She had increased shortness of breath this morning, she denies chest pain. She endorses right abdominal pain, which she states is chronic. She also has chronic constipation with last BM three days ago. She has not noted any blood in her stool.   Discussed with husband over the phone. He states she has a lot of anxiety at baseline and is not sleeping at night, which he thinks led to this. She had sudden SOB this morning and was desatting to 35%, although unclear if perhaps he meant respirations, and he tried to turn up her home supplemental oxygen but she only got progressively worst. He states she has not had recent cough, sick contact, fever that he knows of. She has been using her inhalers. She chronically has shortness of breath and can only walk to and from the bathroom. He states she has had increased abdominal discomfort after eating recently but that this is ongoing.   In the the ER she was intubated with initial propofol leading to drop in blood pressure, which was stopped. She was started on levophed as well and broad spectrum antibiotics.   Past Medical History  COPD Asthma Hypertension    Significant Hospital Events   Admit 11/6  Consults:    Procedures:  ETT 11/6 >>  Significant Diagnostic Tests:  CXR 11/6 >> enlarged lung volumes, atelectasis    10/14/2018-pulmonary function test-FVC 1.08 (45% predicted) postbronchodilator ratio 57, postbronchodilator FEV1 0.56 (29% predicted), no bronchodilator response   Micro Data:  Blood culture 11/6 >>   Antimicrobials:  Vanc 11/6 Cefepime 11/6 Metro 11/6 Azithro 11/6 >> Ceftriaxone 11/6 >>  Interim history/subjective:  Successful SAT this AM, off Precedex, however shook her head and refused to breath for SBT  Objective   Blood pressure (!) 135/94, pulse 94, temperature 98.6 F (37 C), resp. rate (!) 22, height 5\' 4"  (1.626 m), weight 55.2 kg, SpO2 99 %.    Vent Mode: PRVC FiO2 (%):  [30 %-40 %] 30 % Set Rate:  [22 bmp-24 bmp] 22 bmp Vt Set:  [430 mL] 430 mL PEEP:  [5 cmH20-10 cmH20] 5 cmH20 Plateau Pressure:  [28 cmH20-31 cmH20] 31 cmH20   Intake/Output Summary (Last 24 hours) at 02/27/2020 1112 Last data filed at 02/27/2020 1000 Gross per 24 hour  Intake 2430.6 ml  Output 269 ml  Net 2161.6 ml   Filed Weights   02/25/20 1043 02/27/20 0303  Weight: 51 kg 55.2 kg    General:  Elderly F, sleeping on full vent support HEENT: MM pink/moist, ETT in place, no JVD Neuro: opens eyes to voice, moving extremities to commands CV: s1s2 rrr, no m/r/g PULM:  Expiratory wheezing bilaterally GI: soft, bsx4 active  Extremities: warm/dry, no edema  Skin: no rashes or lesions   Resolved Hospital Problem list     Assessment & Plan:    Acute Hypercapnic Respiratory Failure Secondary  to COPD Exacerbation Hx of four admissions over the last year for COPD exacerbation although has no history of smoking per chart review, ?asthma but no bronchodilator response on PFTs 09/2018. CXR with enlarged lungs and atelectasis. Imaging and mechanics on vent suggest obstructive lung disease.  P: -Pt refused to breath (shaking head no ) during SBT, flipped to full support on PEEP 5 30%FiO2 -pressures still somewhat high with Peak 38 and Plateau 30 -ABG and CXR in AM - steroids daily, q4h  duonebs - switch abx to ceftriaxone/azithromycin  - levo as needed, maintain MAP >65  Shock: Suspect hypovolemia given sensitivity to sedation. Pulse pressure variation on A-line. No source of infection. Responded to 1L LR bolus  P: -resolving, titrating down levophed, lactic 1.3 on admission  HFpEF Echo 11/6 with hyperdynamic LV and indeterminate diastolic function with small pericardial effusion P: -Given atelectasis and slightly worsening renal function, will start Lasix\ -monitor UOP and renal indices  Constipation Right lower quadrant pain she endorses as chronic, some distention, soft, mildly tender on exam. No palpable liver. Endorses chronic constipation, last BM 11/4. Lactic acid normal, no metabolic acidosis P: - miralax/docusate daily  - Tube feeds  Hypothyroidism -T4 WNL P: -resume Synthroid  Best practice:  Diet: start tube feeds  Pain/Anxiety/Delirium protocol (if indicated): fentanyl VAP protocol (if indicated): ordered DVT prophylaxis: lovenox GI prophylaxis: pepcid Glucose control: no history of diabetes, POC glucose checks Mobility: bedrest Code Status: full Family Communication: husband updated 11/8 Disposition: ICU   Labs   CBC: Recent Labs  Lab 02/25/20 1114 02/26/20 0357 02/26/20 0831 02/26/20 1657  WBC 8.8 10.1  --   --   NEUTROABS 6.9  --   --   --   HGB 12.5 10.3* 10.5* 9.2*  HCT 40.2 31.0* 31.0* 27.0*  MCV 94.8 90.4  --   --   PLT 332 407*  --   --     Basic Metabolic Panel: Recent Labs  Lab 02/25/20 1114 02/26/20 0357 02/26/20 0831 02/26/20 1047 02/26/20 1657 02/26/20 1718 02/27/20 0306  NA 132* 134* 137  --  137  --   --   K 3.8 3.8 3.4*  --  3.8  --   --   CL 84* 98  --   --   --   --   --   CO2 37* 24  --   --   --   --   --   GLUCOSE 113* 178*  --   --   --   --   --   BUN 16 28*  --   --   --   --   --   CREATININE 0.69 1.40*  --   --   --   --   --   CALCIUM 9.5 8.7*  --   --   --   --   --   MG  --  2.2  --  2.0   --  2.0 2.1  PHOS  --   --   --  3.1  --  2.9 2.4*   GFR: Estimated Creatinine Clearance: 38.7 mL/min (A) (by C-G formula based on SCr of 1.4 mg/dL (H)). Recent Labs  Lab 02/25/20 1114 02/25/20 1115 02/26/20 0357  WBC 8.8  --  10.1  LATICACIDVEN  --  1.3  --     Liver Function Tests: Recent Labs  Lab 02/25/20 1114 02/25/20 2149  AST 26 26  ALT 13 13  ALKPHOS 51 56  BILITOT 0.6 0.4  PROT 7.9 8.2*  ALBUMIN 3.8 3.9   No results for input(s): LIPASE, AMYLASE in the last 168 hours. No results for input(s): AMMONIA in the last 168 hours.  ABG    Component Value Date/Time   PHART 7.463 (H) 02/26/2020 1657   PCO2ART 36.4 02/26/2020 1657   PO2ART 100 02/26/2020 1657   HCO3 26.1 02/26/2020 1657   TCO2 27 02/26/2020 1657   O2SAT 98.0 02/26/2020 1657     Coagulation Profile: No results for input(s): INR, PROTIME in the last 168 hours.  Cardiac Enzymes: No results for input(s): CKTOTAL, CKMB, CKMBINDEX, TROPONINI in the last 168 hours.  HbA1C: Hgb A1c MFr Bld  Date/Time Value Ref Range Status  02/26/2020 05:18 PM 5.4 4.8 - 5.6 % Final    Comment:    (NOTE) Pre diabetes:          5.7%-6.4%  Diabetes:              >6.4%  Glycemic control for   <7.0% adults with diabetes   12/22/2018 04:37 AM 5.5 4.8 - 5.6 % Final    Comment:    (NOTE) Pre diabetes:          5.7%-6.4% Diabetes:              >6.4% Glycemic control for   <7.0% adults with diabetes     CBG: Recent Labs  Lab 02/26/20 1615 02/26/20 2001 02/26/20 2355 02/27/20 0258 02/27/20 0731  GLUCAP 168* 165* 131* 107* 111*    Review of Systems:   Unable to obtain complete ROS due to intubation, as noted in HPI   Past Medical History  She,  has a past medical history of Asthma, COPD (chronic obstructive pulmonary disease) (HCC), and Hypertension.   Surgical History    Past Surgical History:  Procedure Laterality Date  . WISDOM TOOTH EXTRACTION       Social History   reports that she has  never smoked. She has never used smokeless tobacco. She reports current alcohol use of about 2.0 standard drinks of alcohol per week. She reports that she does not use drugs.   Family History   Her Family history is unknown by patient.   Allergies Allergies  Allergen Reactions  . Stiolto Respimat [Tiotropium Bromide-Olodaterol] Other (See Comments)    Severe headaches and rapid heartrate     Home Medications  Prior to Admission medications   Medication Sig Start Date End Date Taking? Authorizing Provider  acetaminophen (TYLENOL) 500 MG tablet Take 500 mg by mouth daily as needed for mild pain or headache.   Yes [provider]  albuterol (VENTOLIN HFA) 108 (90 Base) MCG/ACT inhaler Inhale into the lungs every 6 (six) hours as needed for wheezing or shortness of breath.   Yes [provider]  ALPRAZolam (XANAX) 0.25 MG tablet Take 1 tablet (0.25 mg total) by mouth 3 (three) times daily as needed for anxiety. Patient taking differently: Take 0.25 mg by mouth 3 (three) times daily as needed for anxiety (shingles pain).  01/02/20  Yes Sagardia, Eilleen KempfMiguel Jose, MD  alum & mag hydroxide-simeth (MAALOX/MYLANTA) 200-200-20 MG/5ML suspension Take 15 mLs by mouth every 6 (six) hours as needed for indigestion or heartburn. 11/12/19  Yes Shah, Pratik D, DO  amLODipine (NORVASC) 5 MG tablet Take 1 tablet (5 mg total) by mouth daily. 08/09/19 02/25/20 Yes SagardiaEilleen Kempf, Miguel Jose, MD  aspirin EC 81 MG EC tablet Take 1 tablet (81 mg total) by mouth daily.  04/17/19  Yes [name], MD  bisoprolol (ZEBETA) 10 MG tablet Take 1 tablet (10 mg total) by mouth daily. 10/20/19  Yes Sagardia, Eilleen Kempf, MD  Budeson-Glycopyrrol-Formoterol (BREZTRI AEROSPHERE) 160-9-4.8 MCG/ACT AERO Inhale 2 puffs into the lungs in the morning and at bedtime. Patient taking differently: Inhale 1 puff into the lungs See admin instructions. Inhale 1 puff into the lungs when walking to bathroom - 6-7 times daily 12/02/19  Yes  [name], NP  Dextromethorphan-guaiFENesin [facility] DM MAXIMUM STRENGTH) 60-1200 MG TB12 Take 1 tablet by mouth 2 (two) times daily as needed (congestion).   Yes [provider]  escitalopram (LEXAPRO) 20 MG tablet Take 1 tablet (20 mg total) by mouth daily. 01/02/20 04/01/20 Yes Sagardia, Eilleen Kempf, MD  famotidine (PEPCID) 10 MG tablet Take 10 mg by mouth 2 (two) times daily as needed for heartburn or indigestion (acid reflux).   Yes [provider]  levothyroxine (SYNTHROID) 50 MCG tablet Take 1 tablet (50 mcg total) by mouth daily at 6 (six) AM. 02/03/20  Yes Marinda Elk, MD  nitroGLYCERIN (NITROSTAT) 0.4 MG SL tablet PLACE 1 TABLET(0.4 MG TOTAL) UNDER THE TONGUE EVERY 5 MINUTES AS NEEDED FOR CHEST PAIN Patient taking differently: Place 0.4 mg under the tongue every 5 (five) minutes as needed for chest pain.  01/10/20  Yes SagardiaEilleen Kempf, MD  OXYGEN Inhale 2 L into the lungs continuous.   Yes [provider]  pantoprazole (PROTONIX) 40 MG tablet Take 1 tablet (40 mg total) by mouth daily. 10/27/19  Yes Sagardia, Eilleen Kempf, MD  polyethylene glycol (MIRALAX / GLYCOLAX) 17 g packet Take 17 g by mouth daily as needed (constipation).   Yes [provider]  prednisoLONE acetate (PRED FORTE) 1 % ophthalmic suspension Place 1 drop into the left eye 4 (four) times daily. 01/10/20  Yes [provider]  rosuvastatin (CRESTOR) 40 MG tablet Take 1 tablet (40 mg total) by mouth daily. 01/25/20 01/19/21 Yes [name], MD  HYDROcodone-acetaminophen (NORCO) 5-325 MG tablet Take 1 tablet by mouth every 6 (six) hours as needed. Patient not taking: Reported on 02/08/2020 01/19/20   [name], MD  ipratropium-albuterol (DUONEB) 0.5-2.5 (3) MG/3ML SOLN Take 3 mLs by nebulization every 6 (six) hours as needed. Patient not taking: Reported on 02/08/2020 05/03/19   [name], NP  valACYclovir (VALTREX) 1000 MG tablet Take  1,000 mg by mouth daily. Patient not taking: Reported on 02/25/2020 01/10/20   [provider]     CRITICAL CARE Performed by: Darcella Gasman Josselyne Onofrio   Total critical care time: 35 minutes  Critical care time was exclusive of separately billable procedures and treating other patients.  Critical care was necessary to treat or prevent imminent or life-threatening deterioration.  Critical care was time spent personally by me on the following activities: development of treatment plan with patient and/or surrogate as well as nursing, discussions with consultants, evaluation of patient's response to treatment, examination of patient, obtaining history from patient or surrogate, ordering and performing treatments and interventions, ordering and review of laboratory studies, ordering and review of radiographic studies, pulse oximetry and re-evaluation of patient's condition.  Darcella Gasman Kebin Maye, PA-C [facility]  Pager# [phone], if no answer [phone]

## 2020-02-28 ENCOUNTER — Inpatient Hospital Stay (HOSPITAL_COMMUNITY): Payer: PRIVATE HEALTH INSURANCE

## 2020-02-28 DIAGNOSIS — R1084 Generalized abdominal pain: Secondary | ICD-10-CM

## 2020-02-28 DIAGNOSIS — R569 Unspecified convulsions: Secondary | ICD-10-CM

## 2020-02-28 DIAGNOSIS — J441 Chronic obstructive pulmonary disease with (acute) exacerbation: Principal | ICD-10-CM

## 2020-02-28 LAB — CBC
HCT: 22.8 % — ABNORMAL LOW (ref 36.0–46.0)
HCT: 25 % — ABNORMAL LOW (ref 36.0–46.0)
Hemoglobin: 7.3 g/dL — ABNORMAL LOW (ref 12.0–15.0)
Hemoglobin: 7.9 g/dL — ABNORMAL LOW (ref 12.0–15.0)
MCH: 29.4 pg (ref 26.0–34.0)
MCH: 29.4 pg (ref 26.0–34.0)
MCHC: 32 g/dL (ref 30.0–36.0)
MCHC: 31.6 g/dL (ref 30.0–36.0)
MCV: 91.9 fL (ref 80.0–100.0)
MCV: 92.9 fL (ref 80.0–100.0)
Platelets: 237 10*3/uL (ref 150–400)
Platelets: 213 10*3/uL (ref 150–400)
RBC: 2.48 MIL/uL — ABNORMAL LOW (ref 3.87–5.11)
RBC: 2.69 MIL/uL — ABNORMAL LOW (ref 3.87–5.11)
RDW: 16.1 % — ABNORMAL HIGH (ref 11.5–15.5)
RDW: 16.3 % — ABNORMAL HIGH (ref 11.5–15.5)
WBC: 9.1 10*3/uL (ref 4.0–10.5)
WBC: 6.5 10*3/uL (ref 4.0–10.5)
nRBC: 0.2 % (ref 0.0–0.2)
nRBC: 0 % (ref 0.0–0.2)

## 2020-02-28 LAB — POCT I-STAT 7, (LYTES, BLD GAS, ICA,H+H)
Acid-Base Excess: 9 mmol/L — ABNORMAL HIGH (ref 0.0–2.0)
Bicarbonate: 34 mmol/L — ABNORMAL HIGH (ref 20.0–28.0)
Calcium, Ion: 1.18 mmol/L (ref 1.15–1.40)
HCT: 25 % — ABNORMAL LOW (ref 36.0–46.0)
Hemoglobin: 8.5 g/dL — ABNORMAL LOW (ref 12.0–15.0)
O2 Saturation: 99 %
Patient temperature: 99.1
Potassium: 3.9 mmol/L (ref 3.5–5.1)
Sodium: 139 mmol/L (ref 135–145)
TCO2: 35 mmol/L — ABNORMAL HIGH (ref 22–32)
pCO2 arterial: 46.2 mmHg (ref 32.0–48.0)
pH, Arterial: 7.476 — ABNORMAL HIGH (ref 7.350–7.450)
pO2, Arterial: 149 mmHg — ABNORMAL HIGH (ref 83.0–108.0)

## 2020-02-28 LAB — COMPREHENSIVE METABOLIC PANEL
ALT: 13 U/L (ref 0–44)
AST: 17 U/L (ref 15–41)
Albumin: 2.5 g/dL — ABNORMAL LOW (ref 3.5–5.0)
Alkaline Phosphatase: 37 U/L — ABNORMAL LOW (ref 38–126)
Anion gap: 8 (ref 5–15)
BUN: 36 mg/dL — ABNORMAL HIGH (ref 6–20)
CO2: 26 mmol/L (ref 22–32)
Calcium: 7.7 mg/dL — ABNORMAL LOW (ref 8.9–10.3)
Chloride: 105 mmol/L (ref 98–111)
Creatinine, Ser: 0.79 mg/dL (ref 0.44–1.00)
GFR, Estimated: >60 mL/min (ref >60)
Glucose, Bld: 112 mg/dL — ABNORMAL HIGH (ref 70–99)
Potassium: 3.3 mmol/L — ABNORMAL LOW (ref 3.5–5.1)
Sodium: 139 mmol/L (ref 135–145)
Total Bilirubin: 0.5 mg/dL (ref 0.3–1.2)
Total Protein: 5 g/dL — ABNORMAL LOW (ref 6.5–8.1)

## 2020-02-28 LAB — TYPE AND SCREEN
ABO/RH(D): O POS
Antibody Screen: NEGATIVE

## 2020-02-28 LAB — ABO/RH: ABO/RH(D): O POS

## 2020-02-28 LAB — GLUCOSE, CAPILLARY
Glucose-Capillary: 103 mg/dL — ABNORMAL HIGH (ref 70–99)
Glucose-Capillary: 102 mg/dL — ABNORMAL HIGH (ref 70–99)
Glucose-Capillary: 176 mg/dL — ABNORMAL HIGH (ref 70–99)
Glucose-Capillary: 127 mg/dL — ABNORMAL HIGH (ref 70–99)
Glucose-Capillary: 160 mg/dL — ABNORMAL HIGH (ref 70–99)
Glucose-Capillary: 126 mg/dL — ABNORMAL HIGH (ref 70–99)

## 2020-02-28 LAB — LIPASE, BLOOD: Lipase: 19 U/L (ref 11–51)

## 2020-02-28 LAB — AMYLASE: Amylase: 87 U/L (ref 28–100)

## 2020-02-28 MED ORDER — IOHEXOL 300 MG/ML  SOLN
100.0000 mL | Freq: Once | INTRAMUSCULAR | Status: AC | PRN
  Administered 2020-02-28: 100 mL via INTRAVENOUS

## 2020-02-28 MED ORDER — LEVOTHYROXINE SODIUM 25 MCG PO TABS
50.0000 ug | ORAL_TABLET | Freq: Every day | ORAL | Status: DC
  Administered 2020-02-29 – 2020-03-08 (×7): 50 ug
  Filled 2020-02-28 (×8): qty 2

## 2020-02-28 MED ORDER — POTASSIUM CHLORIDE 20 MEQ PO PACK
40.0000 meq | PACK | Freq: Once | ORAL | Status: AC
  Administered 2020-02-28: 40 meq
  Filled 2020-02-28: qty 2

## 2020-02-28 MED ORDER — POLYETHYLENE GLYCOL 3350 17 G PO PACK
17.0000 g | PACK | Freq: Two times a day (BID) | ORAL | Status: DC
  Administered 2020-02-28 – 2020-03-04 (×7): 17 g
  Filled 2020-02-28 (×7): qty 1

## 2020-02-28 MED ORDER — DEXMEDETOMIDINE HCL IN NACL 400 MCG/100ML IV SOLN
0.0000 ug/kg/h | INTRAVENOUS | Status: AC
  Administered 2020-02-28 – 2020-02-29 (×2): 0.8 ug/kg/h via INTRAVENOUS
  Administered 2020-02-29: 0.4 ug/kg/h via INTRAVENOUS
  Administered 2020-03-01 (×2): 1 ug/kg/h via INTRAVENOUS
  Filled 2020-02-28 (×2): qty 100
  Filled 2020-02-28: qty 200
  Filled 2020-02-28 (×2): qty 100
  Filled 2020-02-28: qty 200
  Filled 2020-02-28: qty 100

## 2020-02-28 MED ORDER — FAMOTIDINE IN NACL 20-0.9 MG/50ML-% IV SOLN
20.0000 mg | Freq: Two times a day (BID) | INTRAVENOUS | Status: DC
  Administered 2020-02-28 – 2020-03-06 (×15): 20 mg via INTRAVENOUS
  Filled 2020-02-28 (×15): qty 50

## 2020-02-28 NOTE — Progress Notes (Signed)
   02/28/20 0743  Vent Select  Invasive or Noninvasive Invasive  Adult Vent Y  Airway 7.5 mm  Placement Date/Time: 02/25/20 1040   Grade View: Grade 1  Placed By: ED Physician  Airway Device: Endotracheal Tube  ETT Types: Oral  Size (mm): 7.5 mm  Secured at (cm) 25 cm  Measured From Lips  Secured Location Right  Secured By Actuary Repositioned Yes  Prone position No  Site Condition Dry  Adult Ventilator Settings  Vent Type Servo i  Humidity HME  Vent Mode PRVC  Vt Set 430 mL  Set Rate 22 bmp  FiO2 (%) 30 %  I Time 0.7 Sec(s)  PEEP 5 cmH20  Adult Ventilator Measurements  Peak Airway Pressure 12 L/min  Mean Airway Pressure 14 cmH20  Plateau Pressure 34 cmH20  Resp Rate Spontaneous 0 br/min  Resp Rate Total 22 br/min  Exhaled Vt 460 mL  Measured Ve 10.1 mL  I:E Ratio Measured 1:2.9  Auto PEEP 0 cmH20  Total PEEP 5 cmH20  Adult Ventilator Alarms  Alarms On Y  Ve High Alarm 20 L/min  Ve Low Alarm 4 L/min  Resp Rate High Alarm 38 br/min  Resp Rate Low Alarm 8  PEEP Low Alarm 3 cmH2O  Press High Alarm 50 cmH2O  T Apnea 20 sec(s)  VAP Prevention  Cuff pressure after change 26 cm H2O  Daily Weaning Assessment  Daily Assessment of Readiness to Wean Wean protocol criteria met (SBT performed)  Reason not met Apnea  SBT Method CPAP 5 cm H20 and PS 5 cm H20  Weaning Start Time [phone]  Patient response Failed SBT terminated  Reason SBT Terminated  (no patient effort)  Breath Sounds  Bilateral Breath Sounds Clear  R Upper  Breath Sounds Clear  L Upper Breath Sounds Clear  R Lower Breath Sounds Diminished  L Lower Breath Sounds Diminished  Airway Suctioning/Secretions  Suction Type ETT  Suction Device  Catheter  Secretion Amount Small  Secretion Color Clear  Secretion Consistency Thin  Suction Tolerance Tolerated well  Suctioning Adverse Effects None

## 2020-02-28 NOTE — Progress Notes (Signed)
eLink Physician-Brief Progress Note Patient Name: Kathleen Cameron DOB: 07-Aug-1965 MRN: [id number]   Date of Service  02/28/2020  HPI/Events of Note  Notified of K 3.3, creatinine 0.79  eICU Interventions  K 40 meqs per tube x 1     Intervention Category Major Interventions: Electrolyte abnormality - evaluation and management  Darl Pikes 02/28/2020, 6:24 AM

## 2020-02-28 NOTE — Progress Notes (Signed)
NAME:  Kathleen Cameron, MRN:  [id number], DOB:  1966-03-01, LOS: 3 ADMISSION DATE:  02/25/2020, CONSULTATION DATE:  02/25/2020 REFERRING MD:  Vanetta Mulders, MD, CHIEF COMPLAINT:  SOB  Brief History   54yo female with history of COPD presented with shortness of breath, intubated in ED.   History of present illness   54yo female with PMH COPD (no history of smoking with 4 admissions for exacerbation over the last year on supplemental 2L at home, hypertension who presented via EMS in respiratory distress and hypercapnic respiratory failure requiring intubation.   Patient awake off sedation at bedside due to earlier hypotension. She had increased shortness of breath this morning, she denies chest pain. She endorses right abdominal pain, which she states is chronic. She also has chronic constipation with last BM three days ago. She has not noted any blood in her stool.   Discussed with husband over the phone. He states she has a lot of anxiety at baseline and is not sleeping at night, which he thinks led to this. She had sudden SOB this morning and was desatting to 35%, although unclear if perhaps he meant respirations, and he tried to turn up her home supplemental oxygen but she only got progressively worst. He states she has not had recent cough, sick contact, fever that he knows of. She has been using her inhalers. She chronically has shortness of breath and can only walk to and from the bathroom. He states she has had increased abdominal discomfort after eating recently but that this is ongoing.   In the the ER she was intubated with initial propofol leading to drop in blood pressure, which was stopped. She was started on levophed as well and broad spectrum antibiotics.   Past Medical History  COPD Asthma Hypertension    Significant Hospital Events   Admit 11/6  Consults:    Procedures:  ETT 11/6 >>  Significant Diagnostic Tests:  CXR 11/6 >> enlarged lung volumes, atelectasis    10/14/2018-pulmonary function test-FVC 1.08 (45% predicted) postbronchodilator ratio 57, postbronchodilator FEV1 0.56 (29% predicted), no bronchodilator response   11/9 CT head/chest/abdomen>>  Micro Data:   11/6 Blood culture >>  11/6 MRSA>>neg 11/6 Covid and flu>>negative  Antimicrobials:  Vanc 11/6 Cefepime 11/6 Metro 11/6 Azithro 11/6 >> Ceftriaxone 11/6 >>  Interim history/subjective:  Pt had seizure like activity yesterday, EEG negative, awake and following commands this AM with some agitation and again apneic with SBT.  Family reports hallucinations and anxiety at baseline.  Pt has abdominal pain on exam today and husband reports that  She has been c/o recurrent abdominal pain for "months"  Objective   Blood pressure (!) 170/97, pulse 95, temperature (!) 100.4 F (38 C), resp. rate (!) 23, height 5\' 4"  (1.626 m), weight 55.2 kg, SpO2 99 %.    Vent Mode: PRVC FiO2 (%):  [30 %] 30 % Set Rate:  [22 bmp] 22 bmp Vt Set:  [430 mL] 430 mL PEEP:  [5 cmH20] 5 cmH20 Plateau Pressure:  [24 cmH20-34 cmH20] 34 cmH20   Intake/Output Summary (Last 24 hours) at 02/28/2020 0815 Last data filed at 02/28/2020 0600 Gross per 24 hour  Intake 2055.42 ml  Output 3245 ml  Net -1189.58 ml   Filed Weights   02/25/20 1043 02/27/20 0303  Weight: 51 kg 55.2 kg    General:  Elderly F, awake and mildly distressed on full vent support HEENT: MM pink/moist, ETT in place, no JVD Neuro: opens eyes to voice, moving extremities  to commands CV: s1s2 rrr, no m/r/g PULM:  Trace wheezing bilaterally, on full vent support with minimal settings  GI: soft, bsx4 active, grimaces and nods when asked if in pain on abdominal exam. Mildly distended Worse over the R flank with trace ecchymosis Extremities: warm/dry, no edema  Skin: no rashes or lesions   Resolved Hospital Problem list   Shock  Assessment & Plan:    Acute Hypercapnic Respiratory Failure Secondary to COPD Exacerbation Hx of four  admissions over the last year for COPD exacerbation although has no history of smoking per chart review, ?asthma but no bronchodilator response on PFTs 09/2018. CXR with enlarged lungs and atelectasis. Imaging and mechanics on vent suggest obstructive lung disease.  P: -Pt again apneic with SBT, she is improving from COPD standpoint and diuresed ~3L after lasix, unclear why she has been unable to tolerate SBT -Chest CT chest -remains on PEEP 5, 30% FiO2 - steroids daily, q4h duonebs - switch abx to ceftriaxone/azithromycin  - levo as needed, maintain MAP >65  Shock: Suspect hypovolemia given sensitivity to sedation. Pulse pressure variation on A-line. No source of infection. Responded to 1L LR bolus  P: -resolved, off pressors  HFpEF Echo 11/6 with hyperdynamic LV and indeterminate diastolic function with small pericardial effusion P: -Given atelectasis and slightly worsening renal function, good response to diuresis -monitor UOP and renal indices  Anemia Hgb drifting down from 10.5 on admission to 7.3, maybe secondary to critical illness.  She is c/o R flank pain and has slight ecchymosis, no other sign of of significant bleeding P: -hold lovenox, follow CBC -type and screen  Seizure-like activity Observed on 11/8 P: -EEG without epileptiform discharges -CT head   Abdominal Pain  Right lower quadrant pain she endorses as chronic, some distention, soft, mildly tender on exam. No palpable liver. Endorses chronic constipation, last BM 11/4. Lactic acid normal, no metabolic acidosis P: -lipase -CT abd/pelvis  Acute on chronic encephalopathy Sounds like patient has hallucinations and perhaps untreated underlying psychiatric disorder P: -started Precedex with improved agitation  Hypothyroidism -T4 WNL P: -resume Synthroid  Best practice:  Diet: start tube feeds  Pain/Anxiety/Delirium protocol (if indicated): fentanyl VAP protocol (if indicated): ordered DVT prophylaxis:  lovenox GI prophylaxis: pepcid Glucose control: no history of diabetes, POC glucose checks Mobility: bedrest Code Status: full Family Communication: husband updated 11/8 Disposition: ICU   Labs   CBC: Recent Labs  Lab 02/25/20 1114 02/25/20 1114 02/26/20 0357 02/26/20 0831 02/26/20 1657 02/28/20 0356 02/28/20 0500  WBC 8.8  --  10.1  --   --   --  9.1  NEUTROABS 6.9  --   --   --   --   --   --   HGB 12.5   < > 10.3* 10.5* 9.2* 8.5* 7.3*  HCT 40.2   < > 31.0* 31.0* 27.0* 25.0* 22.8*  MCV 94.8  --  90.4  --   --   --  91.9  PLT 332  --  407*  --   --   --  237   < > = values in this interval not displayed.    Basic Metabolic Panel: Recent Labs  Lab 02/25/20 1114 02/25/20 1114 02/26/20 0357 02/26/20 0831 02/26/20 1047 02/26/20 1657 02/26/20 1718 02/27/20 0306 02/27/20 1659 02/28/20 0356 02/28/20 0500  NA 132*   < > 134* 137  --  137  --   --   --  139 139  K 3.8   < >  3.8 3.4*  --  3.8  --   --   --  3.9 3.3*  CL 84*  --  98  --   --   --   --   --   --   --  105  CO2 37*  --  24  --   --   --   --   --   --   --  26  GLUCOSE 113*  --  178*  --   --   --   --   --   --   --  112*  BUN 16  --  28*  --   --   --   --   --   --   --  36*  CREATININE 0.69  --  1.40*  --   --   --   --   --   --   --  0.79  CALCIUM 9.5  --  8.7*  --   --   --   --   --   --   --  7.7*  MG  --   --  2.2  --  2.0  --  2.0 2.1 2.0  --   --   PHOS  --   --   --   --  3.1  --  2.9 2.4* 2.5  --   --    < > = values in this interval not displayed.   GFR: Estimated Creatinine Clearance: 67.8 mL/min (by C-G formula based on SCr of 0.79 mg/dL). Recent Labs  Lab 02/25/20 1114 02/25/20 1115 02/26/20 0357 02/28/20 0500  WBC 8.8  --  10.1 9.1  LATICACIDVEN  --  1.3  --   --     Liver Function Tests: Recent Labs  Lab 02/25/20 1114 02/25/20 2149 02/28/20 0500  AST ALT ALKPHOS 51 56 37*  BILITOT 0.6 0.4 0.5  PROT 7.9 8.2* 5.0*  ALBUMIN 3.8 3.9 2.5*   No  results for input(s): LIPASE, AMYLASE in the last 168 hours. No results for input(s): AMMONIA in the last 168 hours.  ABG    Component Value Date/Time   PHART 7.476 (H) 02/28/2020 0356   PCO2ART 46.2 02/28/2020 0356   PO2ART 149 (H) 02/28/2020 0356   HCO3 34.0 (H) 02/28/2020 0356   TCO2 35 (H) 02/28/2020 0356   O2SAT 99.0 02/28/2020 0356     Coagulation Profile: No results for input(s): INR, PROTIME in the last 168 hours.  Cardiac Enzymes: No results for input(s): CKTOTAL, CKMB, CKMBINDEX, TROPONINI in the last 168 hours.  HbA1C: Hgb A1c MFr Bld  Date/Time Value Ref Range Status  02/26/2020 05:18 PM 5.4 4.8 - 5.6 % Final    Comment:    (NOTE) Pre diabetes:          5.7%-6.4%  Diabetes:              >6.4%  Glycemic control for   <7.0% adults with diabetes   12/22/2018 04:37 AM 5.5 4.8 - 5.6 % Final    Comment:    (NOTE) Pre diabetes:          5.7%-6.4% Diabetes:              >6.4% Glycemic control for   <7.0% adults with diabetes     CBG: Recent Labs  Lab 02/27/20 1529 02/27/20 1938 02/27/20 2337 02/28/20 0325 02/28/20 0755  GLUCAP 141* 150*  117* 103* 102*    Review of Systems:   Unable to obtain complete ROS due to intubation, as noted in HPI   Past Medical History  She,  has a past medical history of Asthma, COPD (chronic obstructive pulmonary disease) (HCC), and Hypertension.   Surgical History    Past Surgical History:  Procedure Laterality Date  . WISDOM TOOTH EXTRACTION       Social History   reports that she has never smoked. She has never used smokeless tobacco. She reports current alcohol use of about 2.0 standard drinks of alcohol per week. She reports that she does not use drugs.   Family History   Her Family history is unknown by patient.   Allergies Allergies  Allergen Reactions  . Stiolto Respimat [Tiotropium Bromide-Olodaterol] Other (See Comments)    Severe headaches and rapid heartrate     Home Medications  Prior to  Admission medications   Medication Sig Start Date End Date Taking? Authorizing Provider  acetaminophen (TYLENOL) 500 MG tablet Take 500 mg by mouth daily as needed for mild pain or headache.   Yes [provider]  albuterol (VENTOLIN HFA) 108 (90 Base) MCG/ACT inhaler Inhale into the lungs every 6 (six) hours as needed for wheezing or shortness of breath.   Yes [provider]  ALPRAZolam (XANAX) 0.25 MG tablet Take 1 tablet (0.25 mg total) by mouth 3 (three) times daily as needed for anxiety. Patient taking differently: Take 0.25 mg by mouth 3 (three) times daily as needed for anxiety (shingles pain).  01/02/20  Yes Sagardia, Eilleen KempfMiguel Jose, MD  alum & mag hydroxide-simeth (MAALOX/MYLANTA) 200-200-20 MG/5ML suspension Take 15 mLs by mouth every 6 (six) hours as needed for indigestion or heartburn. 11/12/19  Yes Shah, Pratik D, DO  amLODipine (NORVASC) 5 MG tablet Take 1 tablet (5 mg total) by mouth daily. 08/09/19 02/25/20 Yes SagardiaEilleen Kempf, Miguel Jose, MD  aspirin EC 81 MG EC tablet Take 1 tablet (81 mg total) by mouth daily. 04/17/19  Yes Calvert Cantorizwan, Saima, MD  bisoprolol (ZEBETA) 10 MG tablet Take 1 tablet (10 mg total) by mouth daily. 10/20/19  Yes Sagardia, Eilleen KempfMiguel Jose, MD  Budeson-Glycopyrrol-Formoterol (BREZTRI AEROSPHERE) 160-9-4.8 MCG/ACT AERO Inhale 2 puffs into the lungs in the morning and at bedtime. Patient taking differently: Inhale 1 puff into the lungs See admin instructions. Inhale 1 puff into the lungs when walking to bathroom - 6-7 times daily 12/02/19  Yes Glenford BayleyWalsh, Elizabeth W, NP  Dextromethorphan-guaiFENesin [facility](MUCINEX DM MAXIMUM STRENGTH) 60-1200 MG TB12 Take 1 tablet by mouth 2 (two) times daily as needed (congestion).   Yes [provider]  escitalopram (LEXAPRO) 20 MG tablet Take 1 tablet (20 mg total) by mouth daily. 01/02/20 04/01/20 Yes Sagardia, Eilleen KempfMiguel Jose, MD  famotidine (PEPCID) 10 MG tablet Take 10 mg by mouth 2 (two) times daily as needed for heartburn or  indigestion (acid reflux).   Yes [provider]  levothyroxine (SYNTHROID) 50 MCG tablet Take 1 tablet (50 mcg total) by mouth daily at 6 (six) AM. 02/03/20  Yes Marinda ElkFeliz Ortiz, Abraham, MD  nitroGLYCERIN (NITROSTAT) 0.4 MG SL tablet PLACE 1 TABLET(0.4 MG TOTAL) UNDER THE TONGUE EVERY 5 MINUTES AS NEEDED FOR CHEST PAIN Patient taking differently: Place 0.4 mg under the tongue every 5 (five) minutes as needed for chest pain.  01/10/20  Yes SagardiaEilleen Kempf, Miguel Jose, MD  OXYGEN Inhale 2 L into the lungs continuous.   Yes [provider]  pantoprazole (PROTONIX) 40 MG tablet Take 1 tablet (  40 mg total) by mouth daily. 10/27/19  Yes Sagardia, Eilleen Kempf, MD  polyethylene glycol (MIRALAX / GLYCOLAX) 17 g packet Take 17 g by mouth daily as needed (constipation).   Yes [provider]  prednisoLONE acetate (PRED FORTE) 1 % ophthalmic suspension Place 1 drop into the left eye 4 (four) times daily. 01/10/20  Yes [provider]  rosuvastatin (CRESTOR) 40 MG tablet Take 1 tablet (40 mg total) by mouth daily. 01/25/20 01/19/21 Yes [name], MD  HYDROcodone-acetaminophen (NORCO) 5-325 MG tablet Take 1 tablet by mouth every 6 (six) hours as needed. Patient not taking: Reported on 02/08/2020 01/19/20   [name], MD  ipratropium-albuterol (DUONEB) 0.5-2.5 (3) MG/3ML SOLN Take 3 mLs by nebulization every 6 (six) hours as needed. Patient not taking: Reported on 02/08/2020 05/03/19   [name], NP  valACYclovir (VALTREX) 1000 MG tablet Take 1,000 mg by mouth daily. Patient not taking: Reported on 02/25/2020 01/10/20   [provider]     CRITICAL CARE Performed by: Darcella Gasman Trashawn Oquendo   Total critical care time: 40 minutes  Critical care time was exclusive of separately billable procedures and treating other patients.  Critical care was necessary to treat or prevent imminent or life-threatening deterioration.  Critical care was time spent  personally by me on the following activities: development of treatment plan with patient and/or surrogate as well as nursing, discussions with consultants, evaluation of patient's response to treatment, examination of patient, obtaining history from patient or surrogate, ordering and performing treatments and interventions, ordering and review of laboratory studies, ordering and review of radiographic studies, pulse oximetry and re-evaluation of patient's condition.  Darcella Gasman Daeshon Grammatico, PA-C [facility]  Pager# [phone], if no answer [phone]

## 2020-02-29 LAB — GLUCOSE, CAPILLARY
Glucose-Capillary: 104 mg/dL — ABNORMAL HIGH (ref 70–99)
Glucose-Capillary: 101 mg/dL — ABNORMAL HIGH (ref 70–99)
Glucose-Capillary: 142 mg/dL — ABNORMAL HIGH (ref 70–99)
Glucose-Capillary: 119 mg/dL — ABNORMAL HIGH (ref 70–99)
Glucose-Capillary: 119 mg/dL — ABNORMAL HIGH (ref 70–99)
Glucose-Capillary: 105 mg/dL — ABNORMAL HIGH (ref 70–99)

## 2020-02-29 LAB — CBC
HCT: 25.1 % — ABNORMAL LOW (ref 36.0–46.0)
Hemoglobin: 7.9 g/dL — ABNORMAL LOW (ref 12.0–15.0)
MCH: 29.6 pg (ref 26.0–34.0)
MCHC: 31.5 g/dL (ref 30.0–36.0)
MCV: 94 fL (ref 80.0–100.0)
Platelets: 229 10*3/uL (ref 150–400)
RBC: 2.67 MIL/uL — ABNORMAL LOW (ref 3.87–5.11)
RDW: 16.6 % — ABNORMAL HIGH (ref 11.5–15.5)
WBC: 8.1 10*3/uL (ref 4.0–10.5)
nRBC: 0.4 % — ABNORMAL HIGH (ref 0.0–0.2)

## 2020-02-29 LAB — COMPREHENSIVE METABOLIC PANEL
ALT: 17 U/L (ref 0–44)
AST: 25 U/L (ref 15–41)
Albumin: 3 g/dL — ABNORMAL LOW (ref 3.5–5.0)
Alkaline Phosphatase: 48 U/L (ref 38–126)
Anion gap: 7 (ref 5–15)
BUN: 29 mg/dL — ABNORMAL HIGH (ref 6–20)
CO2: 30 mmol/L (ref 22–32)
Calcium: 9 mg/dL (ref 8.9–10.3)
Chloride: 102 mmol/L (ref 98–111)
Creatinine, Ser: 0.72 mg/dL (ref 0.44–1.00)
GFR, Estimated: >60 mL/min (ref >60)
Glucose, Bld: 111 mg/dL — ABNORMAL HIGH (ref 70–99)
Potassium: 4.2 mmol/L (ref 3.5–5.1)
Sodium: 139 mmol/L (ref 135–145)
Total Bilirubin: 0.3 mg/dL (ref 0.3–1.2)
Total Protein: 6.1 g/dL — ABNORMAL LOW (ref 6.5–8.1)

## 2020-02-29 LAB — MAGNESIUM: Magnesium: 2.2 mg/dL (ref 1.7–2.4)

## 2020-02-29 LAB — PHOSPHORUS: Phosphorus: 2.5 mg/dL (ref 2.5–4.6)

## 2020-02-29 MED ORDER — ONDANSETRON HCL 4 MG/2ML IJ SOLN: 4.0000 mg | Freq: Once | INTRAMUSCULAR | Status: AC

## 2020-02-29 MED ORDER — SENNOSIDES-DOCUSATE SODIUM 8.6-50 MG PO TABS
2.0000 | ORAL_TABLET | Freq: Two times a day (BID) | ORAL | Status: DC
  Administered 2020-02-29 – 2020-03-04 (×5): 2
  Filled 2020-02-29 (×6): qty 2

## 2020-02-29 MED ORDER — FUROSEMIDE 10 MG/ML IJ SOLN
40.0000 mg | Freq: Once | INTRAMUSCULAR | Status: AC
  Administered 2020-02-29: 40 mg via INTRAVENOUS
  Filled 2020-02-29: qty 4

## 2020-02-29 MED ORDER — ONDANSETRON HCL 4 MG/2ML IJ SOLN
INTRAMUSCULAR | Status: AC
  Administered 2020-02-29: 4 mg
  Filled 2020-02-29: qty 2

## 2020-02-29 MED ORDER — LIP MEDEX EX OINT
TOPICAL_OINTMENT | CUTANEOUS | Status: DC | PRN
  Filled 2020-02-29: qty 7

## 2020-02-29 MED ORDER — ALPRAZOLAM 0.25 MG PO TABS
0.2500 mg | ORAL_TABLET | Freq: Three times a day (TID) | ORAL | Status: DC
  Administered 2020-02-29 – 2020-03-01 (×5): 0.25 mg via ORAL
  Filled 2020-02-29 (×5): qty 1

## 2020-02-29 MED ORDER — SENNOSIDES-DOCUSATE SODIUM 8.6-50 MG PO TABS: 2.0000 | ORAL_TABLET | Freq: Two times a day (BID) | ORAL | Status: DC

## 2020-02-29 MED ORDER — POTASSIUM CHLORIDE 20 MEQ PO PACK
20.0000 meq | PACK | Freq: Once | ORAL | Status: AC
  Administered 2020-02-29: 20 meq
  Filled 2020-02-29: qty 1

## 2020-02-29 NOTE — Progress Notes (Signed)
PCCM interval progress note:  Pt able to wean on pressure support for several hours, however unable to reduce pressure below 15/5 and without TV falling below 300.  Will try SBT again in the AM and hopefully able to extubate.   Darcella Gasman Tryniti Laatsch, PA-C

## 2020-02-29 NOTE — Progress Notes (Signed)
NAME:  Kathleen Cameron, MRN:  [id number], DOB:  [birth year], LOS: 4 ADMISSION DATE:  02/25/2020, CONSULTATION DATE:  02/25/2020 REFERRING MD:  Vanetta Mulders, MD, CHIEF COMPLAINT:  SOB  Brief History   54yo female with history of COPD presented with shortness of breath, intubated in ED.   History of present illness   54yo female with PMH COPD (no history of smoking with 4 admissions for exacerbation over the last year on supplemental 2L at home, hypertension who presented via EMS in respiratory distress and hypercapnic respiratory failure requiring intubation.   Patient awake off sedation at bedside due to earlier hypotension. She had increased shortness of breath this morning, she denies chest pain. She endorses right abdominal pain, which she states is chronic. She also has chronic constipation with last BM three days ago. She has not noted any blood in her stool.   Discussed with husband over the phone. He states she has a lot of anxiety at baseline and is not sleeping at night, which he thinks led to this. She had sudden SOB this morning and was desatting to 35%, although unclear if perhaps he meant respirations, and he tried to turn up her home supplemental oxygen but she only got progressively worst. He states she has not had recent cough, sick contact, fever that he knows of. She has been using her inhalers. She chronically has shortness of breath and can only walk to and from the bathroom. He states she has had increased abdominal discomfort after eating recently but that this is ongoing.   In the the ER she was intubated with initial propofol leading to drop in blood pressure, which was stopped. She was started on levophed as well and broad spectrum antibiotics.   Past Medical History  COPD Asthma Hypertension    Significant Hospital Events   Admit 11/6  Consults:    Procedures:  ETT 11/6 >>  Significant Diagnostic Tests:  CXR 11/6 >> enlarged lung volumes, atelectasis    10/14/2018-pulmonary function test-FVC 1.08 (45% predicted) postbronchodilator ratio 57, postbronchodilator FEV1 0.56 (29% predicted), no bronchodilator response   11/9 CT head>> No acute intracranial abnormality.  Moderate for age cerebral white matter changes, including chronic involvement of the corpus callosum. This is nonspecific but most commonly due to chronic small vessel disease.  11/9 CT chest/ abd/pelvis>>No acute cardiopulmonary findings. Calcified mediastinal lymph nodes and multiple pleural base nodule. Favor chronic pulmonary findings.  No evidence of bowel obstruction or inflammation. Small amount free fluid the pelvis.   Micro Data:   11/6 Blood culture >>  11/6 MRSA>>neg 11/6 Covid and flu>>negative  Antimicrobials:  Vanc 11/6 Cefepime 11/6 Metro 11/6 Azithro 11/6 >> Ceftriaxone 11/6 >>  Interim history/subjective:  More alert and oriented this AM, taking some breaths on pressure support, but without adequate volumes  Objective   Blood pressure 91/76, pulse (!) 106, temperature 98.6 F (37 C), resp. rate (!) 22, height 5\' 4"  (1.626 m), weight 55.2 kg, SpO2 97 %.    Vent Mode: PRVC FiO2 (%):  [30 %] 30 % Set Rate:  [22 bmp] 22 bmp Vt Set:  [430 mL] 430 mL PEEP:  [5 cmH20] 5 cmH20 Plateau Pressure:  [24 cmH20-38 cmH20] 35 cmH20   Intake/Output Summary (Last 24 hours) at 02/29/2020 1010 Last data filed at 02/29/2020 0900 Gross per 24 hour  Intake 901.6 ml  Output 1760 ml  Net -858.4 ml   Filed Weights   02/25/20 1043 02/27/20 0303  Weight: 51 kg 55.2 kg  General:  Elderly F, awake, intubated, awake and in no distress HEENT: MM pink/moist, ETT in place, no JVD Neuro: opens eyes to voice, moving extremities to commands, nodding head to questions CV: s1s2 rrr, no m/r/g PULM: clear bilaterally on full vent support with minimal settings  GI: soft, bsx4 active, grimaces with abdominal examn Extremities: warm/dry, no edema  Skin: no rashes or  lesions   Resolved Hospital Problem list   Shock  Assessment & Plan:    Acute Hypercapnic Respiratory Failure Secondary to COPD Exacerbation Hx of four admissions over the last year for COPD exacerbation although has no history of smoking per chart review, ?asthma but no bronchodilator response on PFTs 09/2018. CXR with enlarged lungs and atelectasis. Imaging and mechanics on vent suggest obstructive lung disease.  P: -Improved mental status and respiratory effort today, ? Underlying psychiatric illness contributing to overall difficulty with SBT.  Better today on precedex, trial SBT again later today -COPD improved, on minimal vent settings -No PE on chest CT,  PEEP 5, 30% FiO2 - steroids daily, finish course today, q4h duonebs - ceftriaxone/azithromycin    Shock: Suspect hypovolemia given sensitivity to sedation. Pulse pressure variation on A-line. No source of infection. Responded to 1L LR bolus  P: -resolved, off pressors  HFpEF Echo 11/6 with hyperdynamic LV and indeterminate diastolic function with small pericardial effusion P: -Given atelectasis and slightly worsening renal function, good response to diuresis.   -Remains ~3L positive so additional 40mg  Lasix today -monitor UOP and renal indices  Anemia Hgb drifting down from 10.5 on admission to 7.3, maybe secondary to critical illness.  She is c/o R flank pain and has slight ecchymosis, no other sign of of significant bleeding P: -hold lovenox, follow CBC -type and screen  Seizure-like activity Observed on 11/8 P: -EEG without epileptiform discharges -CT head unremarkable   Abdominal Pain  Right lower quadrant pain she endorses as chronic, some distention, soft, mildly tender on exam. No palpable liver. Endorses chronic constipation, last BM 11/4. Lactic acid normal, no metabolic acidosis P: -lipase normal -CT abd/pelvis without acute findings -Constipated, continue Miralax, add Senna  Acute on chronic  encephalopathy Sounds like patient has hallucinations and perhaps untreated underlying psychiatric disorder P: -started Precedex with improved agitation  Hypothyroidism -T4 WNL P: -resume Synthroid  Best practice:  Diet: start tube feeds  Pain/Anxiety/Delirium protocol (if indicated): precedex VAP protocol (if indicated): ordered DVT prophylaxis: SCD's GI prophylaxis: pepcid Glucose control: no history of diabetes, POC glucose checks Mobility: bedrest Code Status: full Family Communication: husband updated 11/10 Disposition: ICU   Labs   CBC: Recent Labs  Lab 02/25/20 1114 02/25/20 1114 02/26/20 0357 02/26/20 0831 02/26/20 1657 02/28/20 0356 02/28/20 0500 02/28/20 1708 02/29/20 0553  WBC 8.8  --  10.1  --   --   --  9.1 6.5 8.1  NEUTROABS 6.9  --   --   --   --   --   --   --   --   HGB 12.5   < > 10.3*   < > 9.2* 8.5* 7.3* 7.9* 7.9*  HCT 40.2   < > 31.0*   < > 27.0* 25.0* 22.8* 25.0* 25.1*  MCV 94.8  --  90.4  --   --   --  91.9 92.9 94.0  PLT 332  --  407*  --   --   --  237 213 229   < > = values in this interval not displayed.  Basic Metabolic Panel: Recent Labs  Lab 02/25/20 1114 02/25/20 1114 02/26/20 0357 02/26/20 0357 02/26/20 0831 02/26/20 1047 02/26/20 1657 02/26/20 1718 02/27/20 0306 02/27/20 1659 02/28/20 0356 02/28/20 0500 02/29/20 0553  NA 132*   < > 134*   < > 137  --  137  --   --   --  139 139 139  K 3.8   < > 3.8   < > 3.4*  --  3.8  --   --   --  3.9 3.3* 4.2  CL 84*  --  98  --   --   --   --   --   --   --   --  105 102  CO2 37*  --  24  --   --   --   --   --   --   --   --  26 30  GLUCOSE 113*  --  178*  --   --   --   --   --   --   --   --  112* 111*  BUN 16  --  28*  --   --   --   --   --   --   --   --  36* 29*  CREATININE 0.69  --  1.40*  --   --   --   --   --   --   --   --  0.79 0.72  CALCIUM 9.5  --  8.7*  --   --   --   --   --   --   --   --  7.7* 9.0  MG  --   --  2.2   < >  --  2.0  --  2.0 2.1 2.0  --   --   2.2  PHOS  --   --   --   --   --  3.1  --  2.9 2.4* 2.5  --   --  2.5   < > = values in this interval not displayed.   GFR: Estimated Creatinine Clearance: 67.8 mL/min (by C-G formula based on SCr of 0.72 mg/dL). Recent Labs  Lab 02/25/20 1114 02/25/20 1115 02/26/20 0357 02/28/20 0500 02/28/20 1708 02/29/20 0553  WBC   < >  --  10.1 9.1 6.5 8.1  LATICACIDVEN  --  1.3  --   --   --   --    < > = values in this interval not displayed.    Liver Function Tests: Recent Labs  Lab 02/25/20 1114 02/25/20 2149 02/28/20 0500 02/29/20 0553  AST ALT ALKPHOS 51 56 37* 48  BILITOT 0.6 0.4 0.5 0.3  PROT 7.9 8.2* 5.0* 6.1*  ALBUMIN 3.8 3.9 2.5* 3.0*   Recent Labs  Lab 02/28/20 0500  LIPASE 19  AMYLASE 87   No results for input(s): AMMONIA in the last 168 hours.  ABG    Component Value Date/Time   PHART 7.476 (H) 02/28/2020 0356   PCO2ART 46.2 02/28/2020 0356   PO2ART 149 (H) 02/28/2020 0356   HCO3 34.0 (H) 02/28/2020 0356   TCO2 35 (H) 02/28/2020 0356   O2SAT 99.0 02/28/2020 0356     Coagulation Profile: No results for input(s): INR, PROTIME in the last 168 hours.  Cardiac Enzymes: No results for input(s): CKTOTAL, CKMB, CKMBINDEX, TROPONINI in the last 168 hours.  HbA1C:  Hgb A1c MFr Bld  Date/Time Value Ref Range Status  02/26/2020 05:18 PM 5.4 4.8 - 5.6 % Final    Comment:    (NOTE) Pre diabetes:          5.7%-6.4%  Diabetes:              >6.4%  Glycemic control for   <7.0% adults with diabetes   12/22/2018 04:37 AM 5.5 4.8 - 5.6 % Final    Comment:    (NOTE) Pre diabetes:          5.7%-6.4% Diabetes:              >6.4% Glycemic control for   <7.0% adults with diabetes     CBG: Recent Labs  Lab 02/28/20 1539 02/28/20 1930 02/28/20 2327 02/29/20 0329 02/29/20 0744  GLUCAP 127* 160* 126* 104* 101*    Review of Systems:   Unable to obtain complete ROS due to intubation, as noted in HPI   Past Medical History   She,  has a past medical history of Asthma, COPD (chronic obstructive pulmonary disease) (HCC), and Hypertension.   Surgical History    Past Surgical History:  Procedure Laterality Date  . WISDOM TOOTH EXTRACTION       Social History   reports that she has never smoked. She has never used smokeless tobacco. She reports current alcohol use of about 2.0 standard drinks of alcohol per week. She reports that she does not use drugs.   Family History   Her Family history is unknown by patient.   Allergies Allergies  Allergen Reactions  . Stiolto Respimat [Tiotropium Bromide-Olodaterol] Other (See Comments)    Severe headaches and rapid heartrate     Home Medications  Prior to Admission medications   Medication Sig Start Date End Date Taking? Authorizing Provider  acetaminophen (TYLENOL) 500 MG tablet Take 500 mg by mouth daily as needed for mild pain or headache.   Yes [provider]  albuterol (VENTOLIN HFA) 108 (90 Base) MCG/ACT inhaler Inhale into the lungs every 6 (six) hours as needed for wheezing or shortness of breath.   Yes [provider]  ALPRAZolam (XANAX) 0.25 MG tablet Take 1 tablet (0.25 mg total) by mouth 3 (three) times daily as needed for anxiety. Patient taking differently: Take 0.25 mg by mouth 3 (three) times daily as needed for anxiety (shingles pain).  01/02/20  Yes Sagardia, Eilleen Kempf, MD  alum & mag hydroxide-simeth (MAALOX/MYLANTA) 200-200-20 MG/5ML suspension Take 15 mLs by mouth every 6 (six) hours as needed for indigestion or heartburn. 11/12/19  Yes Shah, Pratik D, DO  amLODipine (NORVASC) 5 MG tablet Take 1 tablet (5 mg total) by mouth daily. 08/09/19 02/25/20 Yes SagardiaEilleen Kempf, MD  aspirin EC 81 MG EC tablet Take 1 tablet (81 mg total) by mouth daily. 04/17/19  Yes [name], MD  bisoprolol (ZEBETA) 10 MG tablet Take 1 tablet (10 mg total) by mouth daily. 10/20/19  Yes Sagardia, Eilleen Kempf, MD  Budeson-Glycopyrrol-Formoterol  (BREZTRI AEROSPHERE) 160-9-4.8 MCG/ACT AERO Inhale 2 puffs into the lungs in the morning and at bedtime. Patient taking differently: Inhale 1 puff into the lungs See admin instructions. Inhale 1 puff into the lungs when walking to bathroom - 6-7 times daily 12/02/19  Yes [name], NP  Dextromethorphan-guaiFENesin [facility] DM MAXIMUM STRENGTH) 60-1200 MG TB12 Take 1 tablet by mouth 2 (two) times daily as needed (congestion).   Yes [provider]  escitalopram (LEXAPRO) 20 MG tablet  Take 1 tablet (20 mg total) by mouth daily. 01/02/20 04/01/20 Yes Sagardia, Eilleen Kempf, MD  famotidine (PEPCID) 10 MG tablet Take 10 mg by mouth 2 (two) times daily as needed for heartburn or indigestion (acid reflux).   Yes [provider]  levothyroxine (SYNTHROID) 50 MCG tablet Take 1 tablet (50 mcg total) by mouth daily at 6 (six) AM. 02/03/20  Yes Marinda Elk, MD  nitroGLYCERIN (NITROSTAT) 0.4 MG SL tablet PLACE 1 TABLET(0.4 MG TOTAL) UNDER THE TONGUE EVERY 5 MINUTES AS NEEDED FOR CHEST PAIN Patient taking differently: Place 0.4 mg under the tongue every 5 (five) minutes as needed for chest pain.  01/10/20  Yes SagardiaEilleen Kempf, MD  OXYGEN Inhale 2 L into the lungs continuous.   Yes [provider]  pantoprazole (PROTONIX) 40 MG tablet Take 1 tablet (40 mg total) by mouth daily. 10/27/19  Yes Sagardia, Eilleen Kempf, MD  polyethylene glycol (MIRALAX / GLYCOLAX) 17 g packet Take 17 g by mouth daily as needed (constipation).   Yes [provider]  prednisoLONE acetate (PRED FORTE) 1 % ophthalmic suspension Place 1 drop into the left eye 4 (four) times daily. 01/10/20  Yes [provider]  rosuvastatin (CRESTOR) 40 MG tablet Take 1 tablet (40 mg total) by mouth daily. 01/25/20 01/19/21 Yes [name], MD  HYDROcodone-acetaminophen (NORCO) 5-325 MG tablet Take 1 tablet by mouth every 6 (six) hours as needed. Patient not taking: Reported on 02/08/2020  01/19/20   [name], MD  ipratropium-albuterol (DUONEB) 0.5-2.5 (3) MG/3ML SOLN Take 3 mLs by nebulization every 6 (six) hours as needed. Patient not taking: Reported on 02/08/2020 05/03/19   [name], NP  valACYclovir (VALTREX) 1000 MG tablet Take 1,000 mg by mouth daily. Patient not taking: Reported on 02/25/2020 01/10/20   [provider]     CRITICAL CARE Performed by: Darcella Gasman Danisha Brassfield   Total critical care time: 35 minutes  Critical care time was exclusive of separately billable procedures and treating other patients.  Critical care was necessary to treat or prevent imminent or life-threatening deterioration.  Critical care was time spent personally by me on the following activities: development of treatment plan with patient and/or surrogate as well as nursing, discussions with consultants, evaluation of patient's response to treatment, examination of patient, obtaining history from patient or surrogate, ordering and performing treatments and interventions, ordering and review of laboratory studies, ordering and review of radiographic studies, pulse oximetry and re-evaluation of patient's condition.  Darcella Gasman Christia Coaxum, PA-C [facility]  Pager# [phone], if no answer [phone]

## 2020-03-01 DIAGNOSIS — J9601 Acute respiratory failure with hypoxia: Secondary | ICD-10-CM | POA: Diagnosis not present

## 2020-03-01 DIAGNOSIS — J9602 Acute respiratory failure with hypercapnia: Secondary | ICD-10-CM | POA: Diagnosis not present

## 2020-03-01 LAB — COMPREHENSIVE METABOLIC PANEL
ALT: 18 U/L (ref 0–44)
AST: 22 U/L (ref 15–41)
Albumin: 3 g/dL — ABNORMAL LOW (ref 3.5–5.0)
Alkaline Phosphatase: 42 U/L (ref 38–126)
Anion gap: 9 (ref 5–15)
BUN: 30 mg/dL — ABNORMAL HIGH (ref 6–20)
CO2: 31 mmol/L (ref 22–32)
Calcium: 9.5 mg/dL (ref 8.9–10.3)
Chloride: 101 mmol/L (ref 98–111)
Creatinine, Ser: 0.82 mg/dL (ref 0.44–1.00)
GFR, Estimated: >60 mL/min (ref >60)
Glucose, Bld: 97 mg/dL (ref 70–99)
Potassium: 4.7 mmol/L (ref 3.5–5.1)
Sodium: 141 mmol/L (ref 135–145)
Total Bilirubin: 0.5 mg/dL (ref 0.3–1.2)
Total Protein: 6.3 g/dL — ABNORMAL LOW (ref 6.5–8.1)

## 2020-03-01 LAB — GLUCOSE, CAPILLARY
Glucose-Capillary: 101 mg/dL — ABNORMAL HIGH (ref 70–99)
Glucose-Capillary: 84 mg/dL (ref 70–99)
Glucose-Capillary: 92 mg/dL (ref 70–99)
Glucose-Capillary: 96 mg/dL (ref 70–99)
Glucose-Capillary: 99 mg/dL (ref 70–99)
Glucose-Capillary: 99 mg/dL (ref 70–99)

## 2020-03-01 LAB — CBC
HCT: 25.3 % — ABNORMAL LOW (ref 36.0–46.0)
Hemoglobin: 7.9 g/dL — ABNORMAL LOW (ref 12.0–15.0)
MCH: 29.7 pg (ref 26.0–34.0)
MCHC: 31.2 g/dL (ref 30.0–36.0)
MCV: 95.1 fL (ref 80.0–100.0)
Platelets: 238 10*3/uL (ref 150–400)
RBC: 2.66 MIL/uL — ABNORMAL LOW (ref 3.87–5.11)
RDW: 17 % — ABNORMAL HIGH (ref 11.5–15.5)
WBC: 6.2 10*3/uL (ref 4.0–10.5)
nRBC: 0.5 % — ABNORMAL HIGH (ref 0.0–0.2)

## 2020-03-01 LAB — CULTURE, BLOOD (SINGLE): Culture: NO GROWTH

## 2020-03-01 MED ORDER — LACTATED RINGERS IV SOLN: INTRAVENOUS | Status: DC

## 2020-03-01 MED ORDER — METOCLOPRAMIDE HCL 5 MG/ML IJ SOLN
10.0000 mg | Freq: Four times a day (QID) | INTRAMUSCULAR | Status: AC
  Administered 2020-03-01 – 2020-03-06 (×18): 10 mg via INTRAVENOUS
  Filled 2020-03-01 (×18): qty 2

## 2020-03-01 MED ORDER — SALINE SPRAY 0.65 % NA SOLN
1.0000 | NASAL | Status: DC | PRN
  Filled 2020-03-01: qty 44

## 2020-03-01 MED ORDER — PHENOL 1.4 % MT LIQD
1.0000 | OROMUCOSAL | Status: DC | PRN
  Filled 2020-03-01: qty 177

## 2020-03-01 MED ORDER — MELATONIN 3 MG PO TABS
3.0000 mg | ORAL_TABLET | Freq: Once | ORAL | Status: AC
  Administered 2020-03-01: 3 mg
  Filled 2020-03-01: qty 1

## 2020-03-01 MED ORDER — FUROSEMIDE 10 MG/ML IJ SOLN
40.0000 mg | Freq: Once | INTRAMUSCULAR | Status: AC
  Administered 2020-03-01: 40 mg via INTRAVENOUS
  Filled 2020-03-01: qty 4

## 2020-03-01 MED ORDER — BISACODYL 10 MG RE SUPP
10.0000 mg | Freq: Every day | RECTAL | Status: DC | PRN
  Administered 2020-03-01 – 2020-03-03 (×2): 10 mg via RECTAL
  Filled 2020-03-01 (×2): qty 1

## 2020-03-01 NOTE — Progress Notes (Signed)
NAME:  Kathleen Cameron, MRN:  [id number], DOB:  [birth year], LOS: 5 ADMISSION DATE:  02/25/2020, CONSULTATION DATE:  02/25/2020 REFERRING MD:  Vanetta Mulders, MD, CHIEF COMPLAINT:  SOB  Brief History   54yo female with history of COPD presented with shortness of breath, intubated in ED.   History of present illness   54yo female with PMH COPD (no history of smoking with 4 admissions for exacerbation over the last year on supplemental 2L at home, hypertension who presented via EMS in respiratory distress and hypercapnic respiratory failure requiring intubation.   Patient awake off sedation at bedside due to earlier hypotension. She had increased shortness of breath this morning, she denies chest pain. She endorses right abdominal pain, which she states is chronic. She also has chronic constipation with last BM three days ago. She has not noted any blood in her stool.   Discussed with husband over the phone. He states she has a lot of anxiety at baseline and is not sleeping at night, which he thinks led to this. She had sudden SOB this morning and was desatting to 35%, although unclear if perhaps he meant respirations, and he tried to turn up her home supplemental oxygen but she only got progressively worst. He states she has not had recent cough, sick contact, fever that he knows of. She has been using her inhalers. She chronically has shortness of breath and can only walk to and from the bathroom. He states she has had increased abdominal discomfort after eating recently but that this is ongoing.   In the the ER she was intubated with initial propofol leading to drop in blood pressure, which was stopped. She was started on levophed as well and broad spectrum antibiotics.   Past Medical History  COPD Asthma Hypertension    Significant Hospital Events   Admit 11/6  Consults:    Procedures:  ETT 11/6 >>  Significant Diagnostic Tests:  CXR 11/6 >> enlarged lung volumes, atelectasis    10/14/2018-pulmonary function test-FVC 1.08 (45% predicted) postbronchodilator ratio 57, postbronchodilator FEV1 0.56 (29% predicted), no bronchodilator response   11/9 CT head>> No acute intracranial abnormality.  Moderate for age cerebral white matter changes, including chronic involvement of the corpus callosum. This is nonspecific but most commonly due to chronic small vessel disease.  11/9 CT chest/ abd/pelvis>>No acute cardiopulmonary findings. Calcified mediastinal lymph nodes and multiple pleural base nodule. Favor chronic pulmonary findings.  No evidence of bowel obstruction or inflammation. Small amount free fluid the pelvis.   Micro Data:   11/6 Blood culture >> neg 11/6 MRSA>>neg 11/6 Covid and flu>>negative  Antimicrobials:  Vanc 11/6 Cefepime 11/6 Metro 11/6 Azithro 11/6 >>11/10 Ceftriaxone 11/6 >>  Interim history/subjective:  11/11: pt still req 15-18/5 for ps trials and still with TV of only ~200-250. Lengthy discussion with her and husband at bedside re: trial of extubation and potential plans for re-intubation. I discussed her settings and that our tests indicate that despite her alertness and participation she is not strong enough for extubation and would fail (while this may not be the case, this is the interpretation of the data). I discussed what they would want in the event that she did fail extubation. They have indicated that they would desire trach. That they are amendable to chronic vent and would want pt at home with vent and family support. The are appropriately emotional about this being a possible reality for them, but remain hopeful she will be successful in the next few days  with extubation.   Objective   Blood pressure 95/79, pulse 94, temperature (!) 96.8 F (36 C), resp. rate 16, height 5\' 4"  (1.626 m), weight 55.6 kg, SpO2 100 %.    Vent Mode: PRVC FiO2 (%):  [30 %] 30 % Set Rate:  [22 bmp] 22 bmp Vt Set:  [430 mL] 430 mL PEEP:  [5 cmH20] 5  cmH20 Pressure Support:  [15 cmH20] 15 cmH20 Plateau Pressure:  [30 cmH20-35 cmH20] 35 cmH20   Intake/Output Summary (Last 24 hours) at 03/01/2020 1049 Last data filed at 03/01/2020 1000 Gross per 24 hour  Intake 819.5 ml  Output 1271 ml  Net -451.5 ml   Filed Weights   02/25/20 1043 02/27/20 0303 03/01/20 0500  Weight: 51 kg 55.2 kg 55.6 kg    General:  Elderly F, awake, intubated, awake and in no distress HEENT: MM pink/moist, ETT in place, no JVD Neuro: no focal deficits 5/5 strength in all extremities CV: s1s2 rrr, no m/r/g PULM: clear bilaterally on full vent support  GI: soft, bsx4 active, mildly distended  Extremities: warm/dry, no edema  Skin: no rashes or lesions   Resolved Hospital Problem list   Shock  Assessment & Plan:    Acute Hypercapnic Respiratory Failure Secondary to COPD Exacerbation Hx of four admissions over the last year for COPD exacerbation although has no history of smoking per chart review, ?asthma but no bronchodilator response on PFTs 09/2018. CXR with enlarged lungs and atelectasis. Imaging and mechanics on vent suggest obstructive lung disease.  P:  -mental status cleared -Underlying psychiatric illness contributing to overall difficulty with SBT.   -Better today on precedex -cont sbt attempts  -discussed with family possibility of failure with extubation and need for trach if desired.  -COPD improved, completed steroids - q4h duonebs prn - ceftriaxone for 6 days/azithromycin completed    Shock: Suspect hypovolemia given sensitivity to sedation.  P: -resolved, off pressors  HFpEF Echo 11/6 with hyperdynamic LV and indeterminate diastolic function with small pericardial effusion P: -additional 40 lasix today -monitor UOP and renal indices, despite I/O reflecting +2.3L for hospital stay clinically appears to be approaching euvolemic  Anemia Hgb drifting down from 10.5 on admission to 7.3, maybe secondary to critical illness.  She is  c/o R flank pain and has slight ecchymosis, no other sign of of significant bleeding P: -hold lovenox, follow CBC -type and screen  Seizure-like activity Observed on 11/8 P: -EEG without epileptiform discharges -CT head unremarkable -resolved   Abdominal Pain  Right lower quadrant pain she endorses as chronic, some distention, soft, mildly tender on exam. No palpable liver. Endorses chronic constipation, last BM 11/4. Lactic acid normal, no metabolic acidosis P: -lipase normal -CT abd/pelvis without acute findings -Constipated, continue Miralax, add Senna -adding suppository  Acute on chronic encephalopathy Sounds like patient has hallucinations and perhaps untreated underlying psychiatric disorder P: -started Precedex with improved agitation  Hypothyroidism -T4 WNL P: -resume Synthroid  Best practice:  Diet: holding until bm Pain/Anxiety/Delirium protocol (if indicated): precedex VAP protocol (if indicated): ordered DVT prophylaxis: SCD's GI prophylaxis: pepcid Glucose control: no history of diabetes, POC glucose checks Mobility: bedrest Code Status: full Family Communication: husband updated 11/11 Disposition: ICU   Labs   CBC: Recent Labs  Lab 02/25/20 1114 02/25/20 1114 02/26/20 0357 02/26/20 0831 02/28/20 0356 02/28/20 0500 02/28/20 1708 02/29/20 0553 03/01/20 0500  WBC 8.8   < > 10.1  --   --  9.1 6.5 8.1 6.2  NEUTROABS 6.9  --   --   --   --   --   --   --   --  HGB 12.5   < > 10.3*   < > 8.5* 7.3* 7.9* 7.9* 7.9*  HCT 40.2   < > 31.0*   < > 25.0* 22.8* 25.0* 25.1* 25.3*  MCV 94.8   < > 90.4  --   --  91.9 92.9 94.0 95.1  PLT 332   < > 407*  --   --  237 213 229 238   < > = values in this interval not displayed.    Basic Metabolic Panel: Recent Labs  Lab 02/25/20 1114 02/25/20 1114 02/26/20 0357 02/26/20 0831 02/26/20 1047 02/26/20 1657 02/26/20 1718 02/27/20 0306 02/27/20 1659 02/28/20 0356 02/28/20 0500 02/29/20 0553  03/01/20 0500  NA 132*   < > 134*   < >  --  137  --   --   --  139 139 139 141  K 3.8   < > 3.8   < >  --  3.8  --   --   --  3.9 3.3* 4.2 4.7  CL 84*  --  98  --   --   --   --   --   --   --  105 102 101  CO2 37*  --  24  --   --   --   --   --   --   --  26 30 31   GLUCOSE 113*  --  178*  --   --   --   --   --   --   --  112* 111* 97  BUN 16  --  28*  --   --   --   --   --   --   --  36* 29* 30*  CREATININE 0.69  --  1.40*  --   --   --   --   --   --   --  0.79 0.72 0.82  CALCIUM 9.5  --  8.7*  --   --   --   --   --   --   --  7.7* 9.0 9.5  MG  --   --  2.2   < > 2.0  --  2.0 2.1 2.0  --   --  2.2  --   PHOS  --   --   --   --  3.1  --  2.9 2.4* 2.5  --   --  2.5  --    < > = values in this interval not displayed.   GFR: Estimated Creatinine Clearance: 66.2 mL/min (by C-G formula based on SCr of 0.82 mg/dL). Recent Labs  Lab 02/25/20 1115 02/26/20 0357 02/28/20 0500 02/28/20 1708 02/29/20 0553 03/01/20 0500  WBC  --    < > 9.1 6.5 8.1 6.2  LATICACIDVEN 1.3  --   --   --   --   --    < > = values in this interval not displayed.    Liver Function Tests: Recent Labs  Lab 02/25/20 1114 02/25/20 2149 02/28/20 0500 02/29/20 0553 03/01/20 0500  AST 26 26 17 25 22   ALT 13 13 13 17 18   ALKPHOS 51 56 37* 48 42  BILITOT 0.6 0.4 0.5 0.3 0.5  PROT 7.9 8.2* 5.0* 6.1* 6.3*  ALBUMIN 3.8 3.9 2.5* 3.0* 3.0*   Recent Labs  Lab 02/28/20 0500  LIPASE 19  AMYLASE 87   No results for input(s): AMMONIA in the last 168 hours.  ABG  Component Value Date/Time   PHART 7.476 (H) 02/28/2020 0356   PCO2ART 46.2 02/28/2020 0356   PO2ART 149 (H) 02/28/2020 0356   HCO3 34.0 (H) 02/28/2020 0356   TCO2 35 (H) 02/28/2020 0356   O2SAT 99.0 02/28/2020 0356     Coagulation Profile: No results for input(s): INR, PROTIME in the last 168 hours.  Cardiac Enzymes: No results for input(s): CKTOTAL, CKMB, CKMBINDEX, TROPONINI in the last 168 hours.  HbA1C: Hgb A1c MFr Bld  Date/Time  Value Ref Range Status  02/26/2020 05:18 PM 5.4 4.8 - 5.6 % Final    Comment:    (NOTE) Pre diabetes:          5.7%-6.4%  Diabetes:              >6.4%  Glycemic control for   <7.0% adults with diabetes   12/22/2018 04:37 AM 5.5 4.8 - 5.6 % Final    Comment:    (NOTE) Pre diabetes:          5.7%-6.4% Diabetes:              >6.4% Glycemic control for   <7.0% adults with diabetes     CBG: Recent Labs  Lab 02/29/20 1530 02/29/20 1931 02/29/20 2329 03/01/20 0312 03/01/20 0737  GLUCAP 119* 119* 105* 99 96     CRITICAL CARE Performed by: Briant Sites Critical care time: The patient is critically ill with multiple organ systems failure and requires high complexity decision making for assessment and support, frequent evaluation and titration of therapies, application of advanced monitoring technologies and extensive interpretation of multiple databases.  Critical care time 56 mins. This represents my time independent of the NPs time taking care of the pt. This is excluding procedures.    Briant Sites DO [facility] 03/01/2020, 10:50 AM

## 2020-03-01 NOTE — Progress Notes (Signed)
Nutrition Follow-up  DOCUMENTATION CODES:   Non-severe (moderate) malnutrition in context of chronic illness  INTERVENTION:   When able, resume TF via OG tube: - Vital AF 1.2 @ 50 ml/hr (1200 ml/day)  Tube feeding regimen provides 1440 kcal, 90 grams of protein, and 973 ml of H2O.   NUTRITION DIAGNOSIS:   Moderate Malnutrition related to chronic illness (COPD) as evidenced by mild fat depletion, moderate muscle depletion, severe muscle depletion.  Ongoing  GOAL:   Patient will meet greater than or equal to 90% of their needs  Unmet at this time, TF are off  MONITOR:   Vent status, Labs, Weight trends, TF tolerance, I & O's  REASON FOR ASSESSMENT:   Ventilator, Consult Enteral/tube feeding initiation and management  ASSESSMENT:   54 year old female who presented to the ED on 11/06 with respiratory distress. PMH of COPD, HTN, asthma. Pt required intubation in the ED.  Discussed pt with RN and during ICU rounds. CCM MD has discussed with family possibility of trach if pt fails extubation.  Tube feeds are currently off per CCM MD due to abdominal distention. CT showing pt with a moderate volume of stool. Per MD, holding tube feeds until pt able to have a BM. OG tube remains in place, currently clamped.  EDW: 51 kg (admit weight)  Patient is currently intubated on ventilator support MV: 7.9 L/min Temp (24hrs), Avg:97.8 F (36.6 C), Min:96.4 F (35.8 C), Max:98.6 F (37 C) BP (cuff): 92/80 MAP (cuff): 86  Drips: Precedex NS: 10 ml/hr  Medications reviewed and include: SSI q 4 hours, miralax, senna, IV abx, IV pepcid  Labs reviewed: hemoglobin 7.9 CBG's: 96-119 x 24 hours  UOP: 1330 ml x 24 hours I/O's: +2.3 L since admit  Diet Order:   Diet Order            Diet NPO time specified  Diet effective now                 EDUCATION NEEDS:   No education needs have been identified at this time  Skin:  Skin Assessment: Reviewed RN Assessment  Last  BM:  no documented BM  Height:   Ht Readings from Last 1 Encounters:  02/25/20 5\' 4"  (1.626 m)    Weight:   Wt Readings from Last 1 Encounters:  03/01/20 55.6 kg    Ideal Body Weight:  54.5 kg  BMI:  Body mass index is 21.04 kg/m.  Estimated Nutritional Needs:   Kcal:  1417  Protein:  75-90 grams  Fluid:  >/= 1.5 L    13/11/21, MS, RD, LDN Inpatient Clinical Dietitian Please see [facility] for contact information.

## 2020-03-01 NOTE — Plan of Care (Signed)
  Problem: Clinical Measurements: Goal: Respiratory complications will improve Outcome: Not Progressing   Problem: Nutrition: Goal: Adequate nutrition will be maintained Outcome: Not Progressing Note: Tube feed stopped due to stomach distention and lack of BM. Will restart after BM   Problem: Elimination: Goal: Will not experience complications related to bowel motility Outcome: Not Progressing

## 2020-03-01 NOTE — Progress Notes (Signed)
eLink Physician-Brief Progress Note Patient Name: Kathleen Cameron DOB: 08-31-1965 MRN: [id number]   Date of Service  03/01/2020  HPI/Events of Note  Patient is requesting a sleep aid.  eICU Interventions  Melatonin 3 mg via NG tube x 1        Shellyann Wandrey U Zaneta Lightcap 03/01/2020, 9:05 PM

## 2020-03-02 ENCOUNTER — Inpatient Hospital Stay (HOSPITAL_COMMUNITY): Payer: PRIVATE HEALTH INSURANCE

## 2020-03-02 DIAGNOSIS — J9602 Acute respiratory failure with hypercapnia: Secondary | ICD-10-CM | POA: Diagnosis not present

## 2020-03-02 LAB — POCT I-STAT 7, (LYTES, BLD GAS, ICA,H+H)
Acid-Base Excess: 10 mmol/L — ABNORMAL HIGH (ref 0.0–2.0)
Bicarbonate: 35.4 mmol/L — ABNORMAL HIGH (ref 20.0–28.0)
Calcium, Ion: 1.21 mmol/L (ref 1.15–1.40)
HCT: 25 % — ABNORMAL LOW (ref 36.0–46.0)
Hemoglobin: 8.5 g/dL — ABNORMAL LOW (ref 12.0–15.0)
O2 Saturation: 100 %
Potassium: 4.1 mmol/L (ref 3.5–5.1)
Sodium: 140 mmol/L (ref 135–145)
TCO2: 37 mmol/L — ABNORMAL HIGH (ref 22–32)
pCO2 arterial: 55.1 mmHg — ABNORMAL HIGH (ref 32.0–48.0)
pH, Arterial: 7.416 (ref 7.350–7.450)
pO2, Arterial: 538 mmHg — ABNORMAL HIGH (ref 83.0–108.0)

## 2020-03-02 LAB — BLOOD GAS, ARTERIAL
Acid-Base Excess: 1 mmol/L (ref 0.0–2.0)
Bicarbonate: 30.4 mmol/L — ABNORMAL HIGH (ref 20.0–28.0)
FIO2: 60
O2 Saturation: 98.2 %
Patient temperature: 37
pCO2 arterial: 107 mmHg (ref 32.0–48.0)
pH, Arterial: 7.081 — CL (ref 7.350–7.450)
pO2, Arterial: 191 mmHg — ABNORMAL HIGH (ref 83.0–108.0)

## 2020-03-02 LAB — COMPREHENSIVE METABOLIC PANEL
ALT: 19 U/L (ref 0–44)
AST: 20 U/L (ref 15–41)
Albumin: 2.9 g/dL — ABNORMAL LOW (ref 3.5–5.0)
Alkaline Phosphatase: 40 U/L (ref 38–126)
Anion gap: 12 (ref 5–15)
BUN: 38 mg/dL — ABNORMAL HIGH (ref 6–20)
CO2: 26 mmol/L (ref 22–32)
Calcium: 9.2 mg/dL (ref 8.9–10.3)
Chloride: 101 mmol/L (ref 98–111)
Creatinine, Ser: 1 mg/dL (ref 0.44–1.00)
GFR, Estimated: >60 mL/min (ref >60)
Glucose, Bld: 90 mg/dL (ref 70–99)
Potassium: 4.2 mmol/L (ref 3.5–5.1)
Sodium: 139 mmol/L (ref 135–145)
Total Bilirubin: 0.7 mg/dL (ref 0.3–1.2)
Total Protein: 5.8 g/dL — ABNORMAL LOW (ref 6.5–8.1)

## 2020-03-02 LAB — GLUCOSE, CAPILLARY
Glucose-Capillary: 82 mg/dL (ref 70–99)
Glucose-Capillary: 82 mg/dL (ref 70–99)
Glucose-Capillary: 87 mg/dL (ref 70–99)
Glucose-Capillary: 87 mg/dL (ref 70–99)
Glucose-Capillary: 101 mg/dL — ABNORMAL HIGH (ref 70–99)
Glucose-Capillary: 95 mg/dL (ref 70–99)

## 2020-03-02 LAB — CBC
HCT: 25.2 % — ABNORMAL LOW (ref 36.0–46.0)
Hemoglobin: 7.8 g/dL — ABNORMAL LOW (ref 12.0–15.0)
MCH: 29.2 pg (ref 26.0–34.0)
MCHC: 31 g/dL (ref 30.0–36.0)
MCV: 94.4 fL (ref 80.0–100.0)
Platelets: 247 10*3/uL (ref 150–400)
RBC: 2.67 MIL/uL — ABNORMAL LOW (ref 3.87–5.11)
RDW: 17.2 % — ABNORMAL HIGH (ref 11.5–15.5)
WBC: 6.4 10*3/uL (ref 4.0–10.5)
nRBC: 0 % (ref 0.0–0.2)

## 2020-03-02 MED ORDER — FENTANYL CITRATE (PF) 100 MCG/2ML IJ SOLN
INTRAMUSCULAR | Status: AC
  Administered 2020-03-02: 100 ug
  Filled 2020-03-02: qty 2

## 2020-03-02 MED ORDER — NITROGLYCERIN 2 % TD OINT
1.0000 [in_us] | TOPICAL_OINTMENT | Freq: Four times a day (QID) | TRANSDERMAL | Status: DC
  Administered 2020-03-03: 1 [in_us] via TOPICAL
  Filled 2020-03-02: qty 30

## 2020-03-02 MED ORDER — ETOMIDATE 2 MG/ML IV SOLN
INTRAVENOUS | Status: AC
  Administered 2020-03-02: 20 mg
  Filled 2020-03-02: qty 20

## 2020-03-02 MED ORDER — LORAZEPAM 2 MG/ML IJ SOLN
2.0000 mg | Freq: Once | INTRAMUSCULAR | Status: AC
  Administered 2020-03-02: 2 mg via INTRAVENOUS
  Filled 2020-03-02: qty 1

## 2020-03-02 MED ORDER — METHYLPREDNISOLONE SODIUM SUCC 125 MG IJ SOLR
60.0000 mg | Freq: Three times a day (TID) | INTRAMUSCULAR | Status: DC
  Administered 2020-03-02 – 2020-03-03 (×3): 60 mg via INTRAVENOUS
  Filled 2020-03-02 (×3): qty 2

## 2020-03-02 MED ORDER — ALPRAZOLAM 0.25 MG PO TABS
0.2500 mg | ORAL_TABLET | Freq: Three times a day (TID) | ORAL | Status: DC
  Filled 2020-03-02: qty 1

## 2020-03-02 MED ORDER — MORPHINE SULFATE (PF) 4 MG/ML IV SOLN
INTRAVENOUS | Status: AC
  Administered 2020-03-02: 4 mg via INTRAVENOUS
  Filled 2020-03-02: qty 1

## 2020-03-02 MED ORDER — SODIUM BICARBONATE 8.4 % IV SOLN
100.0000 meq | Freq: Once | INTRAVENOUS | Status: AC
  Administered 2020-03-02: 100 meq via INTRAVENOUS

## 2020-03-02 MED ORDER — ROCURONIUM BROMIDE 10 MG/ML (PF) SYRINGE
PREFILLED_SYRINGE | INTRAVENOUS | Status: AC
  Administered 2020-03-02: 60 mg
  Filled 2020-03-02: qty 10

## 2020-03-02 MED ORDER — NOREPINEPHRINE 4 MG/250ML-% IV SOLN
INTRAVENOUS | Status: AC
  Administered 2020-03-02: 8 mg
  Filled 2020-03-02: qty 250

## 2020-03-02 MED ORDER — REVEFENACIN 175 MCG/3ML IN SOLN
175.0000 ug | Freq: Every day | RESPIRATORY_TRACT | Status: DC
  Administered 2020-03-02 – 2020-03-05 (×4): 175 ug via RESPIRATORY_TRACT
  Filled 2020-03-02 (×5): qty 3

## 2020-03-02 MED ORDER — MORPHINE SULFATE (PF) 4 MG/ML IV SOLN: 4.0000 mg | Freq: Once | INTRAVENOUS | Status: AC

## 2020-03-02 MED ORDER — PHENYLEPHRINE 40 MCG/ML (10ML) SYRINGE FOR IV PUSH (FOR BLOOD PRESSURE SUPPORT)
PREFILLED_SYRINGE | INTRAVENOUS | Status: AC
  Filled 2020-03-02: qty 10

## 2020-03-02 MED ORDER — NOREPINEPHRINE 4 MG/250ML-% IV SOLN: 0.0000 ug/min | INTRAVENOUS | Status: DC

## 2020-03-02 MED ORDER — MIDAZOLAM HCL 2 MG/2ML IJ SOLN
INTRAMUSCULAR | Status: AC
  Administered 2020-03-02: 4 mg
  Filled 2020-03-02: qty 4

## 2020-03-02 MED ORDER — BUDESONIDE 0.25 MG/2ML IN SUSP
0.2500 mg | Freq: Two times a day (BID) | RESPIRATORY_TRACT | Status: DC
  Administered 2020-03-02 – 2020-03-08 (×13): 0.25 mg via RESPIRATORY_TRACT
  Filled 2020-03-02 (×13): qty 2

## 2020-03-02 MED ORDER — ENOXAPARIN SODIUM 40 MG/0.4ML ~~LOC~~ SOLN
40.0000 mg | SUBCUTANEOUS | Status: DC
  Administered 2020-03-02 – 2020-03-08 (×7): 40 mg via SUBCUTANEOUS
  Filled 2020-03-02 (×7): qty 0.4

## 2020-03-02 MED ORDER — ARFORMOTEROL TARTRATE 15 MCG/2ML IN NEBU
15.0000 ug | INHALATION_SOLUTION | Freq: Two times a day (BID) | RESPIRATORY_TRACT | Status: DC
  Administered 2020-03-02 – 2020-03-08 (×13): 15 ug via RESPIRATORY_TRACT
  Filled 2020-03-02 (×16): qty 2

## 2020-03-02 NOTE — Procedures (Signed)
Arterial Catheter Insertion Procedure Note  Nimrit Kehres  [id number]  08-Mar-1966  Date:03/02/20  Time:2:46 PM    Provider Performing: Lorin Glass    Procedure: Insertion of Arterial Line (62263) with US guidance (33545)   Indication(s) Blood pressure monitoring and/or need for frequent ABGs  Consent Unable to obtain consent due to emergent nature of procedure.  Anesthesia None   Time Out Verified patient identification, verified procedure, site/side was marked, verified correct patient position, special equipment/implants available, medications/allergies/relevant history reviewed, required imaging and test results available.   Sterile Technique Maximal sterile technique including full sterile barrier drape, hand hygiene, sterile gown, sterile gloves, mask, hair covering, sterile ultrasound probe cover (if used).   Procedure Description Area of catheter insertion was cleaned with chlorhexidine and draped in sterile fashion. With real-time ultrasound guidance an arterial catheter was placed into the right radial artery.  Appropriate arterial tracings confirmed on monitor.     Complications/Tolerance None; patient tolerated the procedure well.   EBL Minimal   Specimen(s) None

## 2020-03-02 NOTE — Progress Notes (Signed)
Assisted tele visit to patient with daughter.  Melvena Vink Harold, RN  

## 2020-03-02 NOTE — Procedures (Signed)
Diagnostic Bronchoscopy Procedure Note   Kathleen Cameron  [id number]  Jun 05, 1965  Date:03/02/20  Time:2:40 PM   Provider Performing:Robert Sunga E Yunus Stoklosa  Procedure: Diagnostic Bronchoscopy (02637)  Indication(s) Assist with direct visualization of tracheostomy placement  Consent Risks of the procedure as well as the alternatives and risks of each were explained to the patient and/or caregiver.  Consent for the procedure was obtained.   Anesthesia See separate tracheostomy note   Time Out Verified patient identification, verified procedure, site/side was marked, verified correct patient position, special equipment/implants available, medications/allergies/relevant history reviewed, required imaging and test results available.   Sterile Technique Usual hand hygiene, masks, gowns, and gloves were used   Procedure Description Bronchoscope advanced through endotracheal tube and into airway.  After suctioning out tracheal secretions, bronchoscope used to provide direct visualization of tracheostomy placement.   Complications/Tolerance None; patient tolerated the procedure well.  EBL None   Specimen(s) None   Tessie Fass MSN, AGACNP-BC [facility] [phone] 03/02/2020, 2:40 PM

## 2020-03-02 NOTE — Progress Notes (Signed)
NAME:  Kathleen Cameron, MRN:  [id number], DOB:  13-May-1965, LOS: 6 ADMISSION DATE:  02/25/2020, CONSULTATION DATE:  02/25/2020 REFERRING MD:  Vanetta Mulders, MD, CHIEF COMPLAINT:  SOB  Brief History   54yo female with history of COPD presented with shortness of breath, intubated in ED.   History of present illness   54yo female with PMH COPD (no history of smoking with 4 admissions for exacerbation over the last year on supplemental 2L at home, hypertension who presented via EMS in respiratory distress and hypercapnic respiratory failure requiring intubation.   Patient awake off sedation at bedside due to earlier hypotension. She had increased shortness of breath this morning, she denies chest pain. She endorses right abdominal pain, which she states is chronic. She also has chronic constipation with last BM three days ago. She has not noted any blood in her stool.   Discussed with husband over the phone. He states she has a lot of anxiety at baseline and is not sleeping at night, which he thinks led to this. She had sudden SOB this morning and was desatting to 35%, although unclear if perhaps he meant respirations, and he tried to turn up her home supplemental oxygen but she only got progressively worst. He states she has not had recent cough, sick contact, fever that he knows of. She has been using her inhalers. She chronically has shortness of breath and can only walk to and from the bathroom. He states she has had increased abdominal discomfort after eating recently but that this is ongoing.   In the the ER she was intubated with initial propofol leading to drop in blood pressure, which was stopped. She was started on levophed as well and broad spectrum antibiotics.   Past Medical History  COPD Asthma Hypertension    Significant Hospital Events   Admit 11/6  Consults:    Procedures:  ETT 11/6 >>  Significant Diagnostic Tests:  CXR 11/6 >> enlarged lung volumes, atelectasis    10/14/2018-pulmonary function test-FVC 1.08 (45% predicted) postbronchodilator ratio 57, postbronchodilator FEV1 0.56 (29% predicted), no bronchodilator response   11/9 CT head>> No acute intracranial abnormality.  Moderate for age cerebral white matter changes, including chronic involvement of the corpus callosum. This is nonspecific but most commonly due to chronic small vessel disease.  11/9 CT chest/ abd/pelvis>>No acute cardiopulmonary findings. Calcified mediastinal lymph nodes and multiple pleural base nodule. Favor chronic pulmonary findings.  No evidence of bowel obstruction or inflammation. Small amount free fluid the pelvis.   Micro Data:   11/6 Blood culture >> neg 11/6 MRSA>>neg 11/6 Covid and flu>>negative  Antimicrobials:  Vanc 11/6 Cefepime 11/6 Metro 11/6 Azithro 11/6 >>11/10 Ceftriaxone 11/6 >>  Interim history/subjective:  No events, awake on vent, continues to do poorly with SBT.   Objective   Blood pressure (!) 84/68, pulse 76, temperature (!) 97.5 F (36.4 C), temperature source Oral, resp. rate (!) 22, height 5\' 4"  (1.626 m), weight 56.2 kg, SpO2 100 %.    Vent Mode: PRVC FiO2 (%):  [30 %] 30 % Set Rate:  [15 bmp-22 bmp] 22 bmp Vt Set:  [430 mL] 430 mL PEEP:  [5 cmH20] 5 cmH20 Plateau Pressure:  [28 cmH20-35 cmH20] 29 cmH20   Intake/Output Summary (Last 24 hours) at 03/02/2020 0746 Last data filed at 03/02/2020 0600 Gross per 24 hour  Intake 862.06 ml  Output 160 ml  Net 702.06 ml   Filed Weights   02/27/20 0303 03/01/20 0500 03/02/20 0500  Weight: 55.2  kg 55.6 kg 56.2 kg    Constitutional: middle age woman in no acute distress Eyes: eyes are anicteric, reactive to light Ears, nose, mouth, and throat: mucous membranes moist, trachea midline, ETT with minimal secretions Cardiovascular: heart sounds are regular, ext are warm to touch. no edema Respiratory: severely diminished BL Gastrointestinal: abdomen is soft with + BS Skin: No rashes,  normal turgor Neurologic: moves all 4 ext to command briskly Psychiatric: RASS 0  Labs stable including anemia Only 160 UoP documented  Resolved Hospital Problem list   Shock  Assessment & Plan:  Severe COPD on vent- multiple attempts at SBT have failed, has received usual AECOPD tx.  First time intubated.  Her airway mechanics may just not work well with ETT. - Extubation trial today to BIPAP, if fails, proceed with tracheostomy - Wean precedex - Nebs as ordered - Finish CAP therapy  Pleural nodules- 3 mo f/u scan  Abd pain- nothing actionable on CT A/P, seems improved today after BM  Best practice:  Diet: on hold pending extubation Pain/Anxiety/Delirium protocol (if indicated): precedex VAP protocol (if indicated): ordered DVT prophylaxis: SCD's GI prophylaxis: pepcid Glucose control: SSI Mobility: bedrest Code Status: full Family Communication: husband updated 11/12 at bedside Disposition: ICU    Patient critically ill due to respiratory failure Interventions to address this today attempting extubation Risk of deterioration without these interventions is high  I personally spent 38 minutes providing critical care not including any separately billable procedures  Myrla Halsted MD [facility] 03/02/2020 8:20 AM Personal pager: #[phone] If unanswered, please page CCM On-call: [phone]

## 2020-03-02 NOTE — Procedures (Signed)
Percutaneous Tracheostomy Procedure Note   Kathleen Cameron  [id number]  [birth year]  Date:03/02/20  Time:2:39 PM   Provider Performing:Jocelin Schuelke C Katrinka Blazing  Procedure: Percutaneous Tracheostomy with Bronchoscopic Guidance (48250)  Indication(s) Recurrent respiratory failure  Consent Risks of the procedure as well as the alternatives and risks of each were explained to the patient and/or caregiver.  Consent for the procedure was obtained.  Anesthesia Etomidate, Versed, Fentanyl, Rocuronium   Time Out Verified patient identification, verified procedure, site/side was marked, verified correct patient position, special equipment/implants available, medications/allergies/relevant history reviewed, required imaging and test results available.   Sterile Technique Maximal sterile technique including sterile barrier drape, hand hygiene, sterile gown, sterile gloves, mask, hair covering.    Procedure Description Appropriate anatomy identified by palpation.  Patient's neck prepped and draped in sterile fashion.  1% lidocaine with epinephrine was used to anesthetize skin overlying neck.  1.5cm incision made and blunt dissection performed until tracheal rings could be easily palpated.   Then a size 6-0 Shiley tracheostomy was placed under bronchoscopic visualization using usual Seldinger technique and serial dilation.   Bronchoscope confirmed placement above the carina.  Tracheostomy was sutured in place with adhesive pad to protect skin under pressure.    Patient connected to ventilator.   Complications/Tolerance None; patient tolerated the procedure well. Chest X-ray is ordered to confirm no post-procedural complication.   EBL Minimal   Specimen(s) None

## 2020-03-02 NOTE — Procedures (Signed)
Intubation Procedure Note  Kathleen Cameron  [id number]  08/05/1965  Date:03/02/20  Time:2:11 PM   Provider Performing:Ronav Furney Elaina Pattee    Procedure: Intubation (31500)  Indication(s) Respiratory Failure  Consent Risks of the procedure as well as the alternatives and risks of each were explained to the patient and/or caregiver.  Consent for the procedure was obtained and is signed in the bedside chart   Anesthesia Etomidate, Versed and Fentanyl   Time Out Verified patient identification, verified procedure, site/side was marked, verified correct patient position, special equipment/implants available, medications/allergies/relevant history reviewed, required imaging and test results available.   Sterile Technique Usual hand hygeine, masks, and gloves were used   Procedure Description Patient positioned in bed supine.  Sedation given as noted above.  Patient was intubated with endotracheal tube using Glidescope.  View was Grade 1 full glottis .  Number of attempts was 1.  Colorimetric CO2 detector was consistent with tracheal placement.   Complications/Tolerance None; patient tolerated the procedure well. Chest X-ray is ordered to verify placement.   EBL Minimal   Specimen(s) None

## 2020-03-03 ENCOUNTER — Inpatient Hospital Stay (HOSPITAL_COMMUNITY): Payer: PRIVATE HEALTH INSURANCE

## 2020-03-03 DIAGNOSIS — J9602 Acute respiratory failure with hypercapnia: Secondary | ICD-10-CM | POA: Diagnosis not present

## 2020-03-03 LAB — CBC
HCT: 24.7 % — ABNORMAL LOW (ref 36.0–46.0)
Hemoglobin: 7.9 g/dL — ABNORMAL LOW (ref 12.0–15.0)
MCH: 29.5 pg (ref 26.0–34.0)
MCHC: 32 g/dL (ref 30.0–36.0)
MCV: 92.2 fL (ref 80.0–100.0)
Platelets: 252 10*3/uL (ref 150–400)
RBC: 2.68 MIL/uL — ABNORMAL LOW (ref 3.87–5.11)
RDW: 16.3 % — ABNORMAL HIGH (ref 11.5–15.5)
WBC: 6 10*3/uL (ref 4.0–10.5)
nRBC: 0 % (ref 0.0–0.2)

## 2020-03-03 LAB — COMPREHENSIVE METABOLIC PANEL
ALT: 20 U/L (ref 0–44)
AST: 18 U/L (ref 15–41)
Albumin: 3 g/dL — ABNORMAL LOW (ref 3.5–5.0)
Alkaline Phosphatase: 42 U/L (ref 38–126)
Anion gap: 14 (ref 5–15)
BUN: 40 mg/dL — ABNORMAL HIGH (ref 6–20)
CO2: 27 mmol/L (ref 22–32)
Calcium: 9.3 mg/dL (ref 8.9–10.3)
Chloride: 101 mmol/L (ref 98–111)
Creatinine, Ser: 1.01 mg/dL — ABNORMAL HIGH (ref 0.44–1.00)
GFR, Estimated: >60 mL/min (ref >60)
Glucose, Bld: 117 mg/dL — ABNORMAL HIGH (ref 70–99)
Potassium: 4 mmol/L (ref 3.5–5.1)
Sodium: 142 mmol/L (ref 135–145)
Total Bilirubin: 0.8 mg/dL (ref 0.3–1.2)
Total Protein: 5.9 g/dL — ABNORMAL LOW (ref 6.5–8.1)

## 2020-03-03 LAB — GLUCOSE, CAPILLARY
Glucose-Capillary: 106 mg/dL — ABNORMAL HIGH (ref 70–99)
Glucose-Capillary: 109 mg/dL — ABNORMAL HIGH (ref 70–99)
Glucose-Capillary: 110 mg/dL — ABNORMAL HIGH (ref 70–99)
Glucose-Capillary: 119 mg/dL — ABNORMAL HIGH (ref 70–99)
Glucose-Capillary: 142 mg/dL — ABNORMAL HIGH (ref 70–99)
Glucose-Capillary: 151 mg/dL — ABNORMAL HIGH (ref 70–99)
Glucose-Capillary: 62 mg/dL — ABNORMAL LOW (ref 70–99)
Glucose-Capillary: 63 mg/dL — ABNORMAL LOW (ref 70–99)
Glucose-Capillary: 63 mg/dL — ABNORMAL LOW (ref 70–99)

## 2020-03-03 LAB — TRIGLYCERIDES: Triglycerides: 124 mg/dL (ref <150)

## 2020-03-03 MED ORDER — DEXTROSE 50 % IV SOLN: 12.5000 g | INTRAVENOUS | Status: AC

## 2020-03-03 MED ORDER — PROPOFOL 1000 MG/100ML IV EMUL
5.0000 ug/kg/min | INTRAVENOUS | Status: DC
  Administered 2020-03-03: 10 ug/kg/min via INTRAVENOUS
  Administered 2020-03-03: 35 ug/kg/min via INTRAVENOUS
  Administered 2020-03-04: 40 ug/kg/min via INTRAVENOUS
  Administered 2020-03-05: 15 ug/kg/min via INTRAVENOUS
  Filled 2020-03-03 (×5): qty 100

## 2020-03-03 MED ORDER — DEXTROSE 10 % IV SOLN: INTRAVENOUS | Status: DC

## 2020-03-03 MED ORDER — DEXTROSE 50 % IV SOLN
12.5000 g | INTRAVENOUS | Status: AC
  Administered 2020-03-03: 12.5 g via INTRAVENOUS

## 2020-03-03 MED ORDER — DEXTROSE 50 % IV SOLN
INTRAVENOUS | Status: AC
  Filled 2020-03-03: qty 50

## 2020-03-03 MED ORDER — DEXTROSE 50 % IV SOLN
INTRAVENOUS | Status: AC
  Administered 2020-03-03: 12.5 g via INTRAVENOUS
  Filled 2020-03-03: qty 50

## 2020-03-03 MED ORDER — METHYLPREDNISOLONE SODIUM SUCC 125 MG IJ SOLR
60.0000 mg | INTRAMUSCULAR | Status: AC
  Administered 2020-03-03 – 2020-03-05 (×3): 60 mg via INTRAVENOUS
  Filled 2020-03-03 (×3): qty 2

## 2020-03-03 MED ORDER — PREDNISONE 20 MG PO TABS: 20.0000 mg | ORAL_TABLET | Freq: Every day | ORAL | Status: DC

## 2020-03-03 MED ORDER — DEXTROSE 50 % IV SOLN
INTRAVENOUS | Status: AC
  Administered 2020-03-03: 50 mL
  Filled 2020-03-03: qty 50

## 2020-03-03 MED ORDER — METHYLPREDNISOLONE SODIUM SUCC 125 MG IJ SOLR
40.0000 mg | INTRAMUSCULAR | Status: DC
  Filled 2020-03-03: qty 2

## 2020-03-03 NOTE — Progress Notes (Signed)
eLink Physician-Brief Progress Note Patient Name: Kathleen Cameron DOB: 12-12-1965 MRN: [id number]   Date of Service  03/03/2020  HPI/Events of Note  Hypoglycemia - 2 episodes to 62 and 63.  eICU Interventions  Plan: 1. D10W to run IV at 40 mL/hour.      Intervention Category Major Interventions: Other:  Lenell Antu 03/03/2020, 4:30 AM

## 2020-03-03 NOTE — Progress Notes (Signed)
Hypoglycemic Event  CBG: 63  Treatment: D50 25 mL (12.5 gm)  Symptoms: None  Follow-up CBG: Time:0059 CBG Result:106  Possible Reasons for Event: Inadequate meal intake  Comments/MD notified:Elink notified     Sidonie Dickens

## 2020-03-03 NOTE — Progress Notes (Signed)
PT Cancellation Note  Patient Details Name: Kathleen Cameron MRN: [id number] DOB: [birth year]   Cancelled Treatment:    Reason Eval/Treat Not Completed: Patient not medically ready. Rn reports just getting and NG in place and that patient is very irritated with all the events that have happened in the last 24 hours. Acute PT to return as able to perform PT eval to allow patient to rest and process the last 24 hours.  Lewis Shock, PT, DPT Acute Rehabilitation Services Pager #: [phone] Office #: [phone]    [name] 03/03/2020, 1:19 PM

## 2020-03-03 NOTE — Progress Notes (Signed)
Hypoglycemic Event  CBG: 62  Treatment: half amp of dextrose   Symptoms: None  Follow-up CBG: Time:0353 CBG Result:109  Possible Reasons for Event: Inadequate meal intake  Comments/MD notified:elink notified     Sidonie Dickens

## 2020-03-03 NOTE — Progress Notes (Signed)
NAME:  Kathleen Cameron, MRN:  [id number], DOB:  07-19-1965, LOS: 7 ADMISSION DATE:  02/25/2020, CONSULTATION DATE:  02/25/2020 REFERRING MD:  Vanetta Mulders, MD, CHIEF COMPLAINT:  SOB  Brief History   54yo female with history of COPD presented with shortness of breath, intubated in ED.   History of present illness   54yo female with PMH COPD (no history of smoking with 4 admissions for exacerbation over the last year on supplemental 2L at home, hypertension who presented via EMS in respiratory distress and hypercapnic respiratory failure requiring intubation.   Patient awake off sedation at bedside due to earlier hypotension. She had increased shortness of breath this morning, she denies chest pain. She endorses right abdominal pain, which she states is chronic. She also has chronic constipation with last BM three days ago. She has not noted any blood in her stool.   Discussed with husband over the phone. He states she has a lot of anxiety at baseline and is not sleeping at night, which he thinks led to this. She had sudden SOB this morning and was desatting to 35%, although unclear if perhaps he meant respirations, and he tried to turn up her home supplemental oxygen but she only got progressively worst. He states she has not had recent cough, sick contact, fever that he knows of. She has been using her inhalers. She chronically has shortness of breath and can only walk to and from the bathroom. He states she has had increased abdominal discomfort after eating recently but that this is ongoing.   In the the ER she was intubated with initial propofol leading to drop in blood pressure, which was stopped. She was started on levophed as well and broad spectrum antibiotics.   Past Medical History  COPD Asthma Hypertension    Significant Hospital Events   Admit 11/6  Consults:    Procedures:  ETT 11/6 >>11/12 Trach 11/12 after failed extubation trial Significant Diagnostic Tests:  CXR  11/6 >> enlarged lung volumes, atelectasis   10/14/2018-pulmonary function test-FVC 1.08 (45% predicted) postbronchodilator ratio 57, postbronchodilator FEV1 0.56 (29% predicted), no bronchodilator response   11/9 CT head>> No acute intracranial abnormality.  Moderate for age cerebral white matter changes, including chronic involvement of the corpus callosum. This is nonspecific but most commonly due to chronic small vessel disease.  11/9 CT chest/ abd/pelvis>>No acute cardiopulmonary findings. Calcified mediastinal lymph nodes and multiple pleural base nodule. Favor chronic pulmonary findings.  No evidence of bowel obstruction or inflammation. Small amount free fluid the pelvis.   Micro Data:   11/6 Blood culture >> neg 11/6 MRSA>>neg 11/6 Covid and flu>>negative  Antimicrobials:  Vanc 11/6 Cefepime 11/6 Metro 11/6 Azithro 11/6 >>11/10 Ceftriaxone 11/6 >>  Interim history/subjective:  Anxious this AM. C/o belly pain. Did have BM.  Objective   Blood pressure (!) 146/108, pulse 95, temperature 97.9 F (36.6 C), temperature source Axillary, resp. rate (!) 22, height 5\' 4"  (1.626 m), weight 56.2 kg, SpO2 100 %.    Vent Mode: PCV FiO2 (%):  [40 %-100 %] 40 % Set Rate:  [12 bmp-35 bmp] 22 bmp Vt Set:  [350 mL] 350 mL PEEP:  [5 cmH20] 5 cmH20 Plateau Pressure:  [15 cmH20-48 cmH20] 34 cmH20   Intake/Output Summary (Last 24 hours) at 03/03/2020 0836 Last data filed at 03/03/2020 0700 Gross per 24 hour  Intake 1366.89 ml  Output --  Net 1366.89 ml   Filed Weights   02/27/20 0303 03/01/20 0500 03/02/20 0500  Weight:  55.2 kg 55.6 kg 56.2 kg    Constitutional: middle age woman tearful on vent Eyes: eyes are anicteric, reactive to light Ears, nose, mouth, and throat: tracheostomy in place with minimal blood oozing from stoma, MM dry Cardiovascular: heart sounds are tachycardic, ext are warm to touch. no edema Respiratory: severely diminished BL Gastrointestinal: abdomen is  distended, tympanic to percussion, hypoactive BS Skin: No rashes, normal turgor Neurologic: moves all 4 ext to command briskly Psychiatric: RASS 0  Labs stable including anemia Only 160 UoP documented  Resolved Hospital Problem list   Shock  Assessment & Plan:  Severe COPD on vent- failed extubation 11/12, after lengthy discussion with husband proceeded with tracheostomy to facilitate ventilator weaning. - Brovana/yupelri/pulmicort - Steroid taper as ordered - PS/PMV trials once bronchospasm under better control if we can get her to that point  Pleural nodules- 3 mo f/u scan  Abd pain- again an issue, recheck KUB, NGT place to LIS, serial exams, continue aggressive bowel regimen and reglan  Best practice:  Diet: NGT to LIS, if not much out will try trickle feeds Pain/Anxiety/Delirium protocol (if indicated): precedex, fentanyl gtt and PRNs, will try to wean if able VAP protocol (if indicated): ordered DVT prophylaxis: SCD's GI prophylaxis: pepcid Glucose control: SSI Mobility: bedrest Code Status: full Family Communication: husband updated 11/13 at bedside Disposition: ICU    Patient critically ill due to respiratory failure Interventions to address this today include mechanical ventilation weaning Risk of deterioration without these interventions is high  I personally spent 35 minutes providing critical care not including any separately billable procedures  Myrla Halsted MD [facility] 03/03/2020 8:36 AM Personal pager: [phone] If unanswered, please page CCM On-call: #[phone]

## 2020-03-03 NOTE — Plan of Care (Signed)
  Problem: Education: Goal: Knowledge of General Education information will improve Description: Including pain rating scale, medication(s)/side effects and non-pharmacologic comfort measures Outcome: Progressing   Problem: Health Behavior/Discharge Planning: Goal: Ability to manage health-related needs will improve Outcome: Progressing   Problem: Clinical Measurements: Goal: Ability to maintain clinical measurements within normal limits will improve Outcome: Progressing Goal: Will remain free from infection Outcome: Progressing Goal: Diagnostic test results will improve Outcome: Progressing Goal: Respiratory complications will improve Outcome: Progressing Goal: Cardiovascular complication will be avoided Outcome: Progressing   Problem: Activity: Goal: Risk for activity intolerance will decrease Outcome: Progressing   Problem: Coping: Goal: Level of anxiety will decrease Outcome: Progressing Note: Needs intervention   Problem: Elimination: Goal: Will not experience complications related to bowel motility Outcome: Progressing Goal: Will not experience complications related to urinary retention Outcome: Progressing   Problem: Pain Managment: Goal: General experience of comfort will improve Outcome: Progressing   Problem: Safety: Goal: Ability to remain free from injury will improve Outcome: Progressing   Problem: Skin Integrity: Goal: Risk for impaired skin integrity will decrease Outcome: Progressing   Problem: Activity: Goal: Ability to tolerate increased activity will improve Outcome: Progressing   Problem: Respiratory: Goal: Ability to maintain a clear airway and adequate ventilation will improve Outcome: Progressing   Problem: Role Relationship: Goal: Method of communication will improve Outcome: Progressing

## 2020-03-03 NOTE — Progress Notes (Signed)
SLP Cancellation Note  Patient Details Name: Kathleen Cameron MRN: [id number] DOB: March 26, 1966   Cancelled treatment:       Reason Eval/Treat Not Completed: Patient not medically ready.  Pt is currently on the vent via trach at the time.  SLP will f/u for PMV and BSE readiness.     Kathleen Cameron 03/03/2020, 8:57 AM

## 2020-03-04 DIAGNOSIS — E440 Moderate protein-calorie malnutrition: Secondary | ICD-10-CM

## 2020-03-04 DIAGNOSIS — R1084 Generalized abdominal pain: Secondary | ICD-10-CM | POA: Diagnosis not present

## 2020-03-04 DIAGNOSIS — J9602 Acute respiratory failure with hypercapnia: Secondary | ICD-10-CM | POA: Diagnosis not present

## 2020-03-04 LAB — BASIC METABOLIC PANEL
Anion gap: 10 (ref 5–15)
BUN: 30 mg/dL — ABNORMAL HIGH (ref 6–20)
CO2: 29 mmol/L (ref 22–32)
Calcium: 9 mg/dL (ref 8.9–10.3)
Chloride: 99 mmol/L (ref 98–111)
Creatinine, Ser: 0.86 mg/dL (ref 0.44–1.00)
GFR, Estimated: >60 mL/min (ref >60)
Glucose, Bld: 107 mg/dL — ABNORMAL HIGH (ref 70–99)
Potassium: 3.2 mmol/L — ABNORMAL LOW (ref 3.5–5.1)
Sodium: 138 mmol/L (ref 135–145)

## 2020-03-04 LAB — CBC
HCT: 24.8 % — ABNORMAL LOW (ref 36.0–46.0)
Hemoglobin: 7.7 g/dL — ABNORMAL LOW (ref 12.0–15.0)
MCH: 29.5 pg (ref 26.0–34.0)
MCHC: 31 g/dL (ref 30.0–36.0)
MCV: 95 fL (ref 80.0–100.0)
Platelets: 228 10*3/uL (ref 150–400)
RBC: 2.61 MIL/uL — ABNORMAL LOW (ref 3.87–5.11)
RDW: 16.5 % — ABNORMAL HIGH (ref 11.5–15.5)
WBC: 7 10*3/uL (ref 4.0–10.5)
nRBC: 0 % (ref 0.0–0.2)

## 2020-03-04 LAB — GLUCOSE, CAPILLARY
Glucose-Capillary: 106 mg/dL — ABNORMAL HIGH (ref 70–99)
Glucose-Capillary: 107 mg/dL — ABNORMAL HIGH (ref 70–99)
Glucose-Capillary: 109 mg/dL — ABNORMAL HIGH (ref 70–99)
Glucose-Capillary: 115 mg/dL — ABNORMAL HIGH (ref 70–99)
Glucose-Capillary: 116 mg/dL — ABNORMAL HIGH (ref 70–99)
Glucose-Capillary: 70 mg/dL (ref 70–99)
Glucose-Capillary: 97 mg/dL (ref 70–99)

## 2020-03-04 LAB — PHOSPHORUS
Phosphorus: 2.9 mg/dL (ref 2.5–4.6)
Phosphorus: 3.3 mg/dL (ref 2.5–4.6)

## 2020-03-04 LAB — MAGNESIUM
Magnesium: 2.3 mg/dL (ref 1.7–2.4)
Magnesium: 2.3 mg/dL (ref 1.7–2.4)

## 2020-03-04 MED ORDER — VITAL HIGH PROTEIN PO LIQD
1000.0000 mL | ORAL | Status: DC
  Administered 2020-03-04: 1000 mL

## 2020-03-04 MED ORDER — PROSOURCE TF PO LIQD
45.0000 mL | Freq: Two times a day (BID) | ORAL | Status: DC
  Administered 2020-03-04 – 2020-03-05 (×3): 45 mL
  Filled 2020-03-04 (×3): qty 45

## 2020-03-04 MED ORDER — CLONAZEPAM 1 MG PO TABS
1.0000 mg | ORAL_TABLET | Freq: Three times a day (TID) | ORAL | Status: DC
  Administered 2020-03-04 – 2020-03-05 (×4): 1 mg via ORAL
  Filled 2020-03-04 (×4): qty 1

## 2020-03-04 MED ORDER — METHYLNALTREXONE BROMIDE 12 MG/0.6ML ~~LOC~~ SOLN
12.0000 mg | Freq: Once | SUBCUTANEOUS | Status: AC
  Administered 2020-03-04: 12 mg via SUBCUTANEOUS
  Filled 2020-03-04: qty 0.6

## 2020-03-04 MED ORDER — ESCITALOPRAM OXALATE 10 MG PO TABS
20.0000 mg | ORAL_TABLET | Freq: Every day | ORAL | Status: DC
  Administered 2020-03-04 – 2020-03-05 (×2): 20 mg via ORAL
  Filled 2020-03-04 (×2): qty 2

## 2020-03-04 MED ORDER — POTASSIUM CHLORIDE 10 MEQ/50ML IV SOLN
10.0000 meq | INTRAVENOUS | Status: AC
  Administered 2020-03-04 (×4): 10 meq via INTRAVENOUS
  Filled 2020-03-04 (×4): qty 50

## 2020-03-04 MED ORDER — PANTOPRAZOLE SODIUM 40 MG IV SOLR
40.0000 mg | Freq: Every day | INTRAVENOUS | Status: DC
  Administered 2020-03-04 – 2020-03-06 (×3): 40 mg via INTRAVENOUS
  Filled 2020-03-04 (×3): qty 40

## 2020-03-04 MED ORDER — PREDNISOLONE ACETATE 1 % OP SUSP
1.0000 [drp] | Freq: Four times a day (QID) | OPHTHALMIC | Status: DC
  Administered 2020-03-04 – 2020-03-08 (×18): 1 [drp] via OPHTHALMIC
  Filled 2020-03-04: qty 5

## 2020-03-04 NOTE — Progress Notes (Signed)
Inpatient Rehab Admissions Coordinator Note:   Per therapy recommendations, pt was screened for CIR candidacy by Megan Salon, MS CCC-SLP. At this time, Pt. Has not progressed beyond bed-level exercises and is not an appropriate candidate for CIR. I will not place a consult at this time, but [facility] team will follow pt , monitor for progress, and place consult order if Pt. Appears to be an appropriate CIR candidate.  Please contact me with questions.   Megan Salon, MS, CCC-SLP Rehab Admissions Coordinator  [phone] (celll) [phone] (office)

## 2020-03-04 NOTE — Progress Notes (Signed)
NAME:  Kathleen Cameron, MRN:  [id number], DOB:  Jul 08, 1965, LOS: 8 ADMISSION DATE:  02/25/2020, CONSULTATION DATE:  02/25/2020 REFERRING MD:  Vanetta Mulders, MD, CHIEF COMPLAINT:  SOB  Brief History   54yo female with history of COPD presented with shortness of breath, intubated in ED.   History of present illness   54yo female with PMH COPD (no history of smoking with 4 admissions for exacerbation over the last year on supplemental 2L at home, hypertension who presented via EMS in respiratory distress and hypercapnic respiratory failure requiring intubation.   Patient awake off sedation at bedside due to earlier hypotension. She had increased shortness of breath this morning, she denies chest pain. She endorses right abdominal pain, which she states is chronic. She also has chronic constipation with last BM three days ago. She has not noted any blood in her stool.   Discussed with husband over the phone. He states she has a lot of anxiety at baseline and is not sleeping at night, which he thinks led to this. She had sudden SOB this morning and was desatting to 35%, although unclear if perhaps he meant respirations, and he tried to turn up her home supplemental oxygen but she only got progressively worst. He states she has not had recent cough, sick contact, fever that he knows of. She has been using her inhalers. She chronically has shortness of breath and can only walk to and from the bathroom. He states she has had increased abdominal discomfort after eating recently but that this is ongoing.   In the the ER she was intubated with initial propofol leading to drop in blood pressure, which was stopped. She was started on levophed as well and broad spectrum antibiotics.   Past Medical History  COPD Asthma Hypertension    Significant Hospital Events   Admit 11/6  Consults:    Procedures:  ETT 11/6 >>11/12 Trach 11/12 after failed extubation trial Significant Diagnostic Tests:  CXR  11/6 >> enlarged lung volumes, atelectasis   10/14/2018-pulmonary function test-FVC 1.08 (45% predicted) postbronchodilator ratio 57, postbronchodilator FEV1 0.56 (29% predicted), no bronchodilator response   11/9 CT head>> No acute intracranial abnormality.  Moderate for age cerebral white matter changes, including chronic involvement of the corpus callosum. This is nonspecific but most commonly due to chronic small vessel disease.  11/9 CT chest/ abd/pelvis>>No acute cardiopulmonary findings. Calcified mediastinal lymph nodes and multiple pleural base nodule. Favor chronic pulmonary findings.  No evidence of bowel obstruction or inflammation. Small amount free fluid the pelvis.   Micro Data:   11/6 Blood culture >> neg 11/6 MRSA>>neg 11/6 Covid and flu>>negative  Antimicrobials:  Vanc 11/6 Cefepime 11/6 Metro 11/6 Azithro 11/6 >>11/10 Ceftriaxone 11/6 >>  Interim history/subjective:  No events. Sedated yesterday for extreme agitation/tachycardia/hypertension. Weaning sedation today. Remains in PC with low lung compliance.  Objective   Blood pressure 99/73, pulse (!) 48, temperature (!) 96.8 F (36 C), temperature source Axillary, resp. rate (!) 22, height 5\' 4"  (1.626 m), weight 58.8 kg, SpO2 100 %.    Vent Mode: PCV FiO2 (%):  [40 %] 40 % Set Rate:  [22 bmp] 22 bmp PEEP:  [5 cmH20] 5 cmH20 Plateau Pressure:  [26 cmH20-34 cmH20] 26 cmH20   Intake/Output Summary (Last 24 hours) at 03/04/2020 0819 Last data filed at 03/04/2020 0600 Gross per 24 hour  Intake 2539.38 ml  Output --  Net 2539.38 ml   Filed Weights   03/01/20 0500 03/02/20 0500 03/04/20 0500  Weight:  55.6 kg 56.2 kg 58.8 kg    Constitutional: middle aged woman in NAD  Eyes: closed, pupils equal Ears, nose, mouth, and throat: trach in place, small thick secretions, NGT in place Cardiovascular: bradycardic, ext warm Respiratory: diminished bilaterally, passive on vent Gastrointestinal: soft,  +BS Skin: No rashes, normal turgor Neurologic: heavily sedated this am, will eval as we lightne Psychiatric: heavily sedated this am   AM labs pending  Resolved Hospital Problem list   Shock  Assessment & Plan:  Severe COPD on vent- failed extubation 11/12, after lengthy discussion with husband proceeded with tracheostomy to facilitate ventilator weaning. Baseline anxiety, chronic pain, depression- complicates weaning efforts - Continue rovana/yupelri/pulmicort - Slow steroids taper as ordered - Restart home lexapro, PO benzodiazepines - Will work on waking her up slowly today, if continues to have panic attacks will have to consider increased PO meds to get her off GTT  Pleural nodules- 3 mo f/u scan (~Feb 2022)  Abd pain- looks to have some ileus on KUB, challenging with TF, reglan, dose of relistor, restart PTA PPI, already on H2B  Best practice:  Diet: Start TF challenge Pain/Anxiety/Delirium protocol (if indicated): see above VAP protocol (if indicated): ordered DVT prophylaxis: SCD's GI prophylaxis: pepcid, PPI (PTA) Glucose control: SSI Mobility: bedrest Code Status: full Family Communication: husband updated 11/14 at bedside Disposition: ICU    Patient critically ill due to respiratory failure Interventions to address this today include mechanical ventilation weaning Risk of deterioration without these interventions is high  I personally spent 31 minutes providing critical care not including any separately billable procedures  Myrla Halsted MD [facility] 03/04/2020 8:19 AM Personal pager: [phone] If unanswered, please page CCM On-call: #[phone]

## 2020-03-04 NOTE — Evaluation (Signed)
Physical Therapy Evaluation Patient Details Name: Kathleen Cameron MRN: [id number] DOB: Jun 09, 1965 Today's Date: 03/04/2020   History of Present Illness  54 y.o. female presented 02/25/20 with shortness of breath and intubated in ED. Trach 11/12; ileus with NG tube 11/13 PMH- COPD on 2 L O2 (4 admissions in past year due to COPD exacerbation), hypertension, depression/anxiety, hld, recent shingles infection,  Clinical Impression   Pt admitted with above diagnosis. PTA husband reports that she walked household distances without a device and used her transport chair in the community.She is currently on full vent support via trach with FiO2 40% PEEP 5. She tolerated bed-level evaluation well with sats 100% throughout, RR 22, HR 80s. She is easily following commands and anticipate will progress with further PT.  Pt currently with functional limitations due to the deficits listed below (see PT Problem List). Pt will benefit from skilled PT to increase their independence and safety with mobility to allow discharge to the venue listed below.       Follow Up Recommendations CIR    Equipment Recommendations  Other (comment) (TBD next venue)    Recommendations for Other Services OT consult     Precautions / Restrictions Precautions Precautions: Fall;Other (comment) Precaution Comments: trach, NG      Mobility  Bed Mobility Overal bed mobility: Needs Assistance             General bed mobility comments: +2 for scooting up to [facility]    Transfers                 General transfer comment: TBA as appropriate  Ambulation/Gait                Stairs            Wheelchair Mobility    Modified Rankin (Stroke Patients Only)       Balance                                             Pertinent Vitals/Pain Pain Assessment: No/denies pain    Home Living Family/patient expects to be discharged to:: Private residence Living Arrangements:  Spouse/significant other Available Help at Discharge: Family;Available 24 hours/day (husband will run errands <1 hour) Type of Home: Apartment Home Access: Stairs to enter Entrance Stairs-Rails: Right Entrance Stairs-Number of Steps: 1 Home Layout: One level Home Equipment: Grab bars - tub/shower;Walker - 4 wheels;Transport chair;Bedside commode Additional Comments: husband reports use BSC as shower seat    Prior Function Level of Independence: Needs assistance   Gait / Transfers Assistance Needed: transport chair for community level; typically walks without a device inside home  ADL's / Homemaking Assistance Needed: Most often does a sink bath; likes to get down into tub; husband assists either way        Hand Dominance   Dominant Hand: Right    Extremity/Trunk Assessment   Upper Extremity Assessment Upper Extremity Assessment: Generalized weakness (shoulders 3/5, elbow 3+)    Lower Extremity Assessment Lower Extremity Assessment: Generalized weakness (hip/knee extension 3+, ankle DF 4/5)       Communication   Communication: Tracheostomy  Cognition Arousal/Alertness: Awake/alert Behavior During Therapy: WFL for tasks assessed/performed Overall Cognitive Status: Difficult to assess  General Comments: smiling, cooperative      General Comments General comments (skin integrity, edema, etc.): Husband present and able to answer questions re: home set-up and prior functional status    Exercises General Exercises - Upper Extremity Shoulder Flexion: AAROM;Both;5 reps Shoulder ABduction: AAROM;Both;5 reps Elbow Flexion: Strengthening;Both;5 reps Elbow Extension: Strengthening;Both;5 reps General Exercises - Lower Extremity Ankle Circles/Pumps: AROM;Both;5 reps Heel Slides: AAROM;Strengthening;Both;5 reps (assisted flexion, resisted extension) Hip ABduction/ADduction: Strengthening;Both;5 reps (manual resistance adduction and  abdct)   Assessment/Plan    PT Assessment Patient needs continued PT services  PT Problem List Decreased strength;Decreased activity tolerance;Decreased balance;Decreased mobility;Decreased knowledge of use of DME;Cardiopulmonary status limiting activity       PT Treatment Interventions DME instruction;Gait training;Functional mobility training;Therapeutic activities;Therapeutic exercise;Balance training;Patient/family education    PT Goals (Current goals can be found in the Care Plan section)  Acute Rehab PT Goals Patient Stated Goal: unable due to trach; nods yes to getting stronger and walking PT Goal Formulation: With patient/family Time For Goal Achievement: 03/18/20 Potential to Achieve Goals: Good    Frequency Min 3X/week   Barriers to discharge        Co-evaluation               AM-PAC PT "6 Clicks" Mobility  Outcome Measure Help needed turning from your back to your side while in a flat bed without using bedrails?: A Lot Help needed moving from lying on your back to sitting on the side of a flat bed without using bedrails?: A Lot Help needed moving to and from a bed to a chair (including a wheelchair)?: Total Help needed standing up from a chair using your arms (e.g., wheelchair or bedside chair)?: Total Help needed to walk in hospital room?: Total Help needed climbing 3-5 steps with a railing? : Total 6 Click Score: 8    End of Session Equipment Utilized During Treatment: Oxygen Activity Tolerance: Patient tolerated treatment well Patient left: in bed;with call bell/phone within reach;with family/visitor present   PT Visit Diagnosis: Muscle weakness (generalized) (M62.81);Difficulty in walking, not elsewhere classified (R26.2)    Time: 3736-6815 PT Time Calculation (min) (ACUTE ONLY): 28 min   Charges:   PT Evaluation $PT Eval Low Complexity: 1 Low PT Treatments $Therapeutic Exercise: 8-22 mins         Jerolyn Center, PT Pager [phone]'  Zena Amos 03/04/2020, 1:56 PM

## 2020-03-05 ENCOUNTER — Other Ambulatory Visit: Payer: Self-pay

## 2020-03-05 ENCOUNTER — Inpatient Hospital Stay (HOSPITAL_COMMUNITY): Payer: PRIVATE HEALTH INSURANCE

## 2020-03-05 ENCOUNTER — Encounter (HOSPITAL_COMMUNITY): Payer: Self-pay | Admitting: Internal Medicine

## 2020-03-05 DIAGNOSIS — J9602 Acute respiratory failure with hypercapnia: Secondary | ICD-10-CM | POA: Diagnosis not present

## 2020-03-05 LAB — BASIC METABOLIC PANEL
Anion gap: 8 (ref 5–15)
BUN: 31 mg/dL — ABNORMAL HIGH (ref 6–20)
CO2: 27 mmol/L (ref 22–32)
Calcium: 9.1 mg/dL (ref 8.9–10.3)
Chloride: 104 mmol/L (ref 98–111)
Creatinine, Ser: 0.71 mg/dL (ref 0.44–1.00)
GFR, Estimated: >60 mL/min (ref >60)
Glucose, Bld: 110 mg/dL — ABNORMAL HIGH (ref 70–99)
Potassium: 4 mmol/L (ref 3.5–5.1)
Sodium: 139 mmol/L (ref 135–145)

## 2020-03-05 LAB — GLUCOSE, CAPILLARY
Glucose-Capillary: 106 mg/dL — ABNORMAL HIGH (ref 70–99)
Glucose-Capillary: 69 mg/dL — ABNORMAL LOW (ref 70–99)
Glucose-Capillary: 156 mg/dL — ABNORMAL HIGH (ref 70–99)
Glucose-Capillary: 83 mg/dL (ref 70–99)
Glucose-Capillary: 103 mg/dL — ABNORMAL HIGH (ref 70–99)
Glucose-Capillary: 80 mg/dL (ref 70–99)
Glucose-Capillary: 65 mg/dL — ABNORMAL LOW (ref 70–99)

## 2020-03-05 LAB — CBC
HCT: 24.9 % — ABNORMAL LOW (ref 36.0–46.0)
Hemoglobin: 7.5 g/dL — ABNORMAL LOW (ref 12.0–15.0)
MCH: 29.1 pg (ref 26.0–34.0)
MCHC: 30.1 g/dL (ref 30.0–36.0)
MCV: 96.5 fL (ref 80.0–100.0)
Platelets: 228 10*3/uL (ref 150–400)
RBC: 2.58 MIL/uL — ABNORMAL LOW (ref 3.87–5.11)
RDW: 16.1 % — ABNORMAL HIGH (ref 11.5–15.5)
WBC: 5.8 10*3/uL (ref 4.0–10.5)
nRBC: 0 % (ref 0.0–0.2)

## 2020-03-05 LAB — MAGNESIUM: Magnesium: 2.3 mg/dL (ref 1.7–2.4)

## 2020-03-05 LAB — PHOSPHORUS: Phosphorus: 2.8 mg/dL (ref 2.5–4.6)

## 2020-03-05 MED ORDER — DEXTROSE 50 % IV SOLN
INTRAVENOUS | Status: AC
  Administered 2020-03-05: 12.5 g via INTRAVENOUS
  Filled 2020-03-05: qty 50

## 2020-03-05 MED ORDER — VITAL AF 1.2 CAL PO LIQD
1000.0000 mL | ORAL | Status: DC
  Administered 2020-03-05 – 2020-03-07 (×3): 1000 mL

## 2020-03-05 MED ORDER — HYDRALAZINE HCL 20 MG/ML IJ SOLN
10.0000 mg | INTRAMUSCULAR | Status: DC | PRN
  Administered 2020-03-05 – 2020-03-07 (×2): 10 mg via INTRAVENOUS
  Filled 2020-03-05 (×3): qty 1

## 2020-03-05 MED ORDER — PREDNISONE 20 MG PO TABS: 20.0000 mg | ORAL_TABLET | Freq: Every day | ORAL | Status: DC

## 2020-03-05 MED ORDER — METHYLPREDNISOLONE SODIUM SUCC 125 MG IJ SOLR
40.0000 mg | INTRAMUSCULAR | Status: DC
  Administered 2020-03-06 – 2020-03-07 (×2): 40 mg via INTRAVENOUS
  Filled 2020-03-05 (×2): qty 2

## 2020-03-05 MED ORDER — CLONAZEPAM 1 MG PO TABS
1.0000 mg | ORAL_TABLET | Freq: Three times a day (TID) | ORAL | Status: DC
  Administered 2020-03-05 – 2020-03-07 (×6): 1 mg
  Filled 2020-03-05 (×6): qty 1

## 2020-03-05 MED ORDER — ESCITALOPRAM OXALATE 10 MG PO TABS
20.0000 mg | ORAL_TABLET | Freq: Every day | ORAL | Status: DC
  Administered 2020-03-06: 20 mg
  Filled 2020-03-05: qty 2

## 2020-03-05 MED ORDER — DEXTROSE 50 % IV SOLN: 12.5000 g | Freq: Once | INTRAVENOUS | Status: AC

## 2020-03-05 NOTE — Procedures (Signed)
Cortrak  Person Inserting Tube:  King, Annalisa Colonna E, RD Tube Type:  Cortrak - 43 inches Tube Location:  Right nare Initial Placement:  Stomach Secured by: Bridle Technique Used to Measure Tube Placement:  Documented cm marking at nare/ corner of mouth Cortrak Secured At:  60 cm    Cortrak Tube Team Note:  Consult received to place a Cortrak feeding tube.   X-ray is required, abdominal x-ray has been ordered by the Cortrak team. Please confirm tube placement before using the Cortrak tube.   If the tube becomes dislodged please keep the tube and contact the Cortrak team at [email or web address] (password [email or web address]) for replacement.  If after hours and replacement cannot be delayed, place a NG tube and confirm placement with an abdominal x-ray.   Obdulio Mash King, MS, RD, LDN Pager number available on Amion 

## 2020-03-05 NOTE — Progress Notes (Addendum)
03/05/2020  I saw and evaluated the patient. Discussed with resident and agree with resident's findings and plan as documented in the resident's note.  I have seen and evaluated the patient for respiratory failure.  S:  More awake and calm today. Denies pain.  O: Blood pressure 116/71, pulse 71, temperature (!) 96.7 F (35.9 C), temperature source Axillary, resp. rate 16, height 5\' 4"  (1.626 m), weight 59.4 kg, SpO2 100 %.  Thin woman in NAD Mild exopthalmos Trach in place with minimal secretions Lung sounds severely diminished, triggering vent Ext no edema  A:  Severe COPD with recurrent exacerbations unable to wean from vent now s/p tracheostomy  Constipation/ileus resolved  P:  - Wean off IV sedative drips onto PO options - In-line PMV trial - Will work with SIMV due to low inspiratory efforts with gradual tapering of backup rate - End date placed for reglan - CIR evaluating patient once weaned from vent, appreciate help   Patient critically ill due to respiratory failure Interventions to address this today ventilator and sedation weaning Risk of deterioration without these interventions is high  I personally spent 38 minutes providing critical care not including any separately billable procedures  MD [facility] 03/05/2020 10:45 AM Personal pager: 03/07/2020 If unanswered, please page CCM On-call: #[phone]   03/05/2020 03/07/2020 MD      NAME:  Kathleen Cameron, MRN:  [name], DOB:  01/03/1966, LOS: 9 ADMISSION DATE:  02/25/2020, CONSULTATION DATE:  02/25/2020 REFERRING MD:  13/09/2019, MD, CHIEF COMPLAINT:  SOB  Brief History   54yo female with history of COPD presented with shortness of breath, intubated in ED.   History of present illness   54yo female with PMH COPD (no history of smoking with 4 admissions for exacerbation over the last year on supplemental 2L at home, hypertension who presented via EMS in  respiratory distress and hypercapnic respiratory failure requiring intubation.   Patient awake off sedation at bedside due to earlier hypotension. She had increased shortness of breath this morning, she denies chest pain. She endorses right abdominal pain, which she states is chronic. She also has chronic constipation with last BM three days ago. She has not noted any blood in her stool.   Discussed with husband over the phone. He states she has a lot of anxiety at baseline and is not sleeping at night, which he thinks led to this. She had sudden SOB this morning and was desatting to 35%, although unclear if perhaps he meant respirations, and he tried to turn up her home supplemental oxygen but she only got progressively worst. He states she has not had recent cough, sick contact, fever that he knows of. She has been using her inhalers. She chronically has shortness of breath and can only walk to and from the bathroom. He states she has had increased abdominal discomfort after eating recently but that this is ongoing.   In the the ER she was intubated with initial propofol leading to drop in blood pressure, which was stopped. She was started on levophed as well and broad spectrum antibiotics.   Past Medical History  COPD Asthma Hypertension   Significant Hospital Events   Admit 11/6  Consults:    Procedures:  ETT 11/6 >>11/12 Trach 11/12 after failed extubation trial Significant Diagnostic Tests:  CXR 11/6 >> enlarged lung volumes, atelectasis   10/14/2018-pulmonary function test-FVC 1.08 (45% predicted) postbronchodilator ratio 57, postbronchodilator FEV1 0.56 (29% predicted), no bronchodilator response  11/9 CT head>> No acute intracranial abnormality.  Moderate for age cerebral white matter changes, including chronic involvement of the corpus callosum. This is nonspecific but most commonly due to chronic small vessel disease.  11/9 CT chest/ abd/pelvis>>No acute cardiopulmonary  findings. Calcified mediastinal lymph nodes and multiple pleural base nodule. Favor chronic pulmonary findings.  No evidence of bowel obstruction or inflammation. Small amount free fluid the pelvis.   Micro Data:   11/6 Blood culture >> neg 11/6 MRSA>>neg 11/6 Covid and flu>>negative  Antimicrobials:  Vanc 11/6 Cefepime 11/6 Metro 11/6 Azithro 11/6 >>11/10 Ceftriaxone 11/6 >> 11/12  Interim history/subjective:  No events. Requiring low sedation for agitation, tachycardia, and anxiety. Klonopin, home seroquel added. Feeling well this morning, no shortness of breath, abdominal pain, or feeling anxious.  Weaning sedation again today to try PS with trach.   Objective   Blood pressure 118/86, pulse (!) 54, temperature (!) 96.7 F (35.9 C), temperature source Axillary, resp. rate (!) 22, height 5\' 4"  (1.626 m), weight 59.4 kg, SpO2 100 %.    Vent Mode: PCV FiO2 (%):  [40 %] 40 % Set Rate:  [22 bmp] 22 bmp PEEP:  [5 cmH20] 5 cmH20 Plateau Pressure:  [22 cmH20-34 cmH20] 31 cmH20   Intake/Output Summary (Last 24 hours) at 03/05/2020 03/07/2020 Last data filed at 03/05/2020 0600 Gross per 24 hour  Intake 1757.39 ml  Output 725 ml  Net 1032.39 ml   Filed Weights   03/02/20 0500 03/04/20 0500 03/05/20 0424  Weight: 56.2 kg 58.8 kg 59.4 kg    Constitutional: middle aged woman in NAD, awake and alert  Eyes: PERRL, eom intact Ears, nose, mouth, and throat: trach in place, small thick secretions Cardiovascular: bradycardic, ext warm, +1 pitting edema  Respiratory: diminished bilaterally, passive on vent Gastrointestinal: distended but soft, +BS, diffusely tender  Skin: No rashes, normal turgor Neurologic: alert, answering questions appropriately with yes/no Psychiatric: calm appearing   Resolved Hospital Problem list   Shock  Assessment & Plan:  Severe COPD on vent- failed extubation 11/12, after lengthy discussion with husband proceeded with tracheostomy to facilitate ventilator  weaning. Baseline anxiety, chronic pain, depression- complicates weaning efforts - Continue rovana/yupelri/pulmicort - Slow steroids taper as ordered - Restart home lexapro, PO benzodiazepines - symptoms of anxiety improved today  - continue trach weaning today, weaning propofol and fentanyl  - place cortrak today  Pleural nodules- 3 mo f/u scan (~Feb 2022)  Abd pain looks to have some ileus on KUB 11/13. Still distended but had ~7 BM overnight s/p relistor.   - cont. reglan  - PTA PPI, H2B - decrease miralax to prn  - cont. Senokot bid - dulcolax ad prn  Best practice:  Diet: Start TF challenge Pain/Anxiety/Delirium protocol (if indicated): see above VAP protocol (if indicated): ordered DVT prophylaxis: SCD's GI prophylaxis: pepcid, PPI (PTA) Glucose control: SSI Mobility: bedrest Code Status: full Family Communication: husband updated 11/14 at bedside Disposition: ICU

## 2020-03-05 NOTE — Progress Notes (Signed)
Nutrition Follow-up  DOCUMENTATION CODES:   Non-severe (moderate) malnutrition in context of chronic illness  INTERVENTION:   Tube feeding via Cortrak: - Change to Vital AF 1.2 @ 50 ml/hr (1200 ml/day)  Tube feeding regimen provides 1440 kcal, 90 grams of protein, and 973 ml of H2O.   NUTRITION DIAGNOSIS:   Moderate Malnutrition related to chronic illness (COPD) as evidenced by mild fat depletion, moderate muscle depletion, severe muscle depletion.  Ongoing  GOAL:   Patient will meet greater than or equal to 90% of their needs  Met via TF  MONITOR:   Vent status, Labs, Weight trends, TF tolerance, I & O's  REASON FOR ASSESSMENT:   Consult Enteral/tube feeding initiation and management  ASSESSMENT:   54 year old female who presented to the ED on 11/06 with respiratory distress. PMH of COPD, HTN, asthma. Pt required intubation in the ED.  11/12 - extubated to BiPAP, reintubated, s/p trach 11/13 - NGT placed to low intermittent suction 11/15 - Cortrak placed, tip gastric per x-ray  Discussed pt with RN and during ICU rounds. Pt tolerating Vital High Protein @ 40 ml/hr without issue and having BMs.  EDW: 51 kg (admit weight) Current weight: 59.4 kg  Pt with non-pitting edema to BUE and +3 pitting edema to BLE.  Current TF: Vital High Protein @ 40 ml/hr, ProSource TF 45 ml BID  Patient is on ventilator support via trach. MV: 3.7 L/min Temp (24hrs), Avg:98 F (36.7 C), Min:96.7 F (35.9 C), Max:98.7 F (37.1 C) BP (a-line): 136/76 MAP (a-line): 101  Drips: Propofol: off this AM  Medications reviewed and include: SSI q 4 hours, solu-medrol, IV reglan 10 mg q 6 hours, IV protonix, miralax, senna, IV pepcid  Labs reviewed: BUN 31, hemoglobin 7.5 CBG's: 69-156 x 24 hours  UOP: 725 ml x 24 hours I/O's: +8.1 L since admit  Diet Order:   Diet Order            Diet NPO time specified  Diet effective now                 EDUCATION NEEDS:   No  education needs have been identified at this time  Skin:  Skin Assessment: Reviewed RN Assessment  Last BM:  03/05/20 multiple type 7 BMs  Height:   Ht Readings from Last 1 Encounters:  02/25/20 $RemoveB'5\' 4"'HApBqihu$  (1.626 m)    Weight:   Wt Readings from Last 1 Encounters:  03/05/20 59.4 kg    Ideal Body Weight:  54.5 kg  BMI:  Body mass index is 22.48 kg/m.  Estimated Nutritional Needs:   Kcal:  1450-1650  Protein:  75-90 grams  Fluid:  >/= 1.5 L    Gaynell Face, MS, RD, LDN Inpatient Clinical Dietitian Please see AMiON for contact information.

## 2020-03-05 NOTE — Progress Notes (Addendum)
  Speech Language Pathology  Patient Details Name: Kathleen Cameron MRN: [id number] DOB: 1965/06/28 Today's Date: 03/05/2020 Time:  -      Planning on PMV assessment inline with vent with RT at 10:30. Pt receiving Cortrak, RT and ST will return at 1330 today               Kathleen Cameron 03/05/2020, 8:55 AM Kathleen Cameron Nurse, children's [phone] Office [phone]

## 2020-03-05 NOTE — Progress Notes (Signed)
eLink Physician-Brief Progress Note Patient Name: Jurnee Nakayama DOB: Nov 14, 1965 MRN: [id number]   Date of Service  03/05/2020  HPI/Events of Note  Hypertension - BP = 184/100.  eICU Interventions  Plan: 1. Hydralazine 10 mg IV Q 4 hours PRN SBP > 160 or DBP > 100     Intervention Category Major Interventions: Hypertension - evaluation and management  Parry Po Eugene 03/05/2020, 11:12 PM

## 2020-03-05 NOTE — Evaluation (Signed)
[facility] Speaking Valve - Evaluation Patient Details  Name: Kathleen Cameron MRN: [id number] Date of Birth: [birth year]  Today's Date: 03/05/2020 Time: 9833-8250 SLP Time Calculation (min) (ACUTE ONLY): 22 min  Past Medical History:  Past Medical History:  Diagnosis Date  . Asthma   . COPD (chronic obstructive pulmonary disease) (HCC)   . Hypertension    Past Surgical History:  Past Surgical History:  Procedure Laterality Date  . WISDOM TOOTH EXTRACTION     HPI:  54 y.o. female presented 02/25/20 with shortness of breath and intubated in ED. Trach 11/12; ileus with NG tube 11/13 PMH- COPD on 2 L O2 (4 admissions in past year due to COPD exacerbation), hypertension, depression/anxiety, hld, recent shingles infection   Assessment / Plan / Recommendation Clinical Impression  PMV assessment completed inline on the ventilator with RT and spouse at bedside with SIMV;PRVC setting. Per RT pt requires a set number of mandatory breaths and volume due to RR of 4. She is alert, following commmands without difficulty. RT performed slow duff deflation, PEEP from 5 to 0 drop in exhaled volume indicated mobility adequate to upper airway. Hoarse vocal quality and adequate intensity intermittently when she inhales deep and implements increased effort. Audible in 1-2 word utterances and needs continued cues/assist to coordinate respiration and phonation. RR 22, SpO2 97% and HR stable. Continue inline vent PMV trials to assist in weaning with ST and RT.     SLP Visit Diagnosis: Aphonia (R49.1)    SLP Assessment  Patient needs continued Speech Lanaguage Pathology Services    Follow Up Recommendations  Other (comment) (TBD)    Frequency and Duration min 2x/week  2 weeks    PMSV Trial PMSV was placed for: 15 min Able to redirect subglottic air through upper airway: Yes Able to Attain Phonation: Yes Voice Quality: Hoarse Able to Expectorate Secretions: Yes Level of Secretion Expectoration with  PMSV: Oral Breath Support for Phonation: Moderately decreased Intelligibility: Intelligibility reduced Word: 50-74% accurate Phrase: 25-49% accurate Respirations During Trial: 22 SpO2 During Trial: 97 % Pulse During Trial: 93   Tracheostomy Tube       Vent Dependency  Vent Mode: SIMV;PRVC;PSV Set Rate: 8 bmp PEEP: 5 cmH20 Pressure Support: 10 cmH20 FiO2 (%): 40 % Vt Set: 430 mL    Cuff Deflation Trial  GO          [name] 03/05/2020, 4:12 PM [name] Garlen Reinig M.Ed Nurse, children's [phone] Office [phone]

## 2020-03-06 DIAGNOSIS — J9602 Acute respiratory failure with hypercapnia: Secondary | ICD-10-CM | POA: Diagnosis not present

## 2020-03-06 LAB — CBC
HCT: 32.3 % — ABNORMAL LOW (ref 36.0–46.0)
Hemoglobin: 9.6 g/dL — ABNORMAL LOW (ref 12.0–15.0)
MCH: 29.3 pg (ref 26.0–34.0)
MCHC: 29.7 g/dL — ABNORMAL LOW (ref 30.0–36.0)
MCV: 98.5 fL (ref 80.0–100.0)
Platelets: 313 10*3/uL (ref 150–400)
RBC: 3.28 MIL/uL — ABNORMAL LOW (ref 3.87–5.11)
RDW: 15.9 % — ABNORMAL HIGH (ref 11.5–15.5)
WBC: 13.3 10*3/uL — ABNORMAL HIGH (ref 4.0–10.5)
nRBC: 0 % (ref 0.0–0.2)

## 2020-03-06 LAB — BASIC METABOLIC PANEL
Anion gap: 10 (ref 5–15)
BUN: 25 mg/dL — ABNORMAL HIGH (ref 6–20)
CO2: 32 mmol/L (ref 22–32)
Calcium: 8.9 mg/dL (ref 8.9–10.3)
Chloride: 97 mmol/L — ABNORMAL LOW (ref 98–111)
Creatinine, Ser: 0.57 mg/dL (ref 0.44–1.00)
GFR, Estimated: >60 mL/min (ref >60)
Glucose, Bld: 98 mg/dL (ref 70–99)
Potassium: 4.9 mmol/L (ref 3.5–5.1)
Sodium: 139 mmol/L (ref 135–145)

## 2020-03-06 LAB — GLUCOSE, CAPILLARY
Glucose-Capillary: 115 mg/dL — ABNORMAL HIGH (ref 70–99)
Glucose-Capillary: 60 mg/dL — ABNORMAL LOW (ref 70–99)
Glucose-Capillary: 63 mg/dL — ABNORMAL LOW (ref 70–99)
Glucose-Capillary: 64 mg/dL — ABNORMAL LOW (ref 70–99)
Glucose-Capillary: 69 mg/dL — ABNORMAL LOW (ref 70–99)
Glucose-Capillary: 79 mg/dL (ref 70–99)
Glucose-Capillary: 90 mg/dL (ref 70–99)
Glucose-Capillary: 94 mg/dL (ref 70–99)
Glucose-Capillary: 99 mg/dL (ref 70–99)

## 2020-03-06 LAB — PHOSPHORUS: Phosphorus: 3.4 mg/dL (ref 2.5–4.6)

## 2020-03-06 LAB — MAGNESIUM: Magnesium: 2.2 mg/dL (ref 1.7–2.4)

## 2020-03-06 LAB — TRIGLYCERIDES: Triglycerides: 163 mg/dL — ABNORMAL HIGH (ref <150)

## 2020-03-06 MED ORDER — DEXTROSE 50 % IV SOLN
INTRAVENOUS | Status: AC
  Administered 2020-03-06: 12.5 g via INTRAVENOUS
  Filled 2020-03-06: qty 50

## 2020-03-06 MED ORDER — AMLODIPINE BESYLATE 5 MG PO TABS
5.0000 mg | ORAL_TABLET | Freq: Every day | ORAL | Status: DC
  Administered 2020-03-06: 5 mg
  Filled 2020-03-06 (×2): qty 1

## 2020-03-06 MED ORDER — BISACODYL 10 MG RE SUPP: 10.0000 mg | Freq: Every day | RECTAL | Status: DC | PRN

## 2020-03-06 MED ORDER — REVEFENACIN 175 MCG/3ML IN SOLN
175.0000 ug | Freq: Every day | RESPIRATORY_TRACT | Status: DC
  Administered 2020-03-06 – 2020-03-08 (×3): 175 ug via RESPIRATORY_TRACT
  Filled 2020-03-06 (×4): qty 3

## 2020-03-06 MED ORDER — TEMAZEPAM 30 MG PO CAPS
30.0000 mg | ORAL_CAPSULE | Freq: Every day | ORAL | Status: DC
  Administered 2020-03-06: 30 mg via ORAL
  Filled 2020-03-06: qty 2

## 2020-03-06 MED ORDER — SODIUM CHLORIDE 0.9% FLUSH
10.0000 mL | Freq: Two times a day (BID) | INTRAVENOUS | Status: DC
  Administered 2020-03-06: 10 mL

## 2020-03-06 MED ORDER — AMLODIPINE BESYLATE 5 MG PO TABS
5.0000 mg | ORAL_TABLET | Freq: Every day | ORAL | Status: DC
  Filled 2020-03-06: qty 1

## 2020-03-06 MED ORDER — POLYETHYLENE GLYCOL 3350 17 G PO PACK: 17.0000 g | PACK | Freq: Every day | ORAL | Status: DC | PRN

## 2020-03-06 MED ORDER — DEXTROSE 50 % IV SOLN: 12.5000 g | INTRAVENOUS | Status: AC

## 2020-03-06 MED ORDER — FUROSEMIDE 10 MG/ML IJ SOLN
40.0000 mg | Freq: Once | INTRAMUSCULAR | Status: AC
  Administered 2020-03-06: 40 mg via INTRAVENOUS
  Filled 2020-03-06: qty 4

## 2020-03-06 MED ORDER — SENNOSIDES-DOCUSATE SODIUM 8.6-50 MG PO TABS: 2.0000 | ORAL_TABLET | Freq: Every evening | ORAL | Status: DC | PRN

## 2020-03-06 MED ORDER — SODIUM CHLORIDE 0.9% FLUSH: 10.0000 mL | INTRAVENOUS | Status: DC | PRN

## 2020-03-06 NOTE — Progress Notes (Addendum)
03/06/2020  I saw and evaluated the patient. Discussed with resident and agree with resident's findings and plan as documented in the resident's note.  I have seen and evaluated the patient for respiratory failure.   S:  Issues with panic attacks overnight requiring PRNs. Back on fentanyl drip. Up in chair with PT this AM.  Denies pain.  O: Blood pressure 96/71, pulse 88, temperature (!) 97.5 F (36.4 C), temperature source Oral, resp. rate (!) 22, height 5\' 4"  (1.626 m), weight 61.2 kg, SpO2 100 %.  Thin woman in NAD Mild esophthalmos Severely diminished breath sounds, even more diminished on R Mildly tachypneic On SIMV due to apneic episodes Trace edema  A:  - Severe COPD with recurrent exacerbations now s/p trach due to inability to wean from ventilator - Intermittent hypertension associated with panic attacks - Ileus turned to diarrhea  P:  - Change opiate drip to PRN - OOB to chair, appreciate PT help - Continue steroids, nebs as ordered - Check AM CXR - Give another dose of lasix - Change bowel regimen to PRN  Patient critically ill due to respiratory failure Interventions to address this today sedation titration and diuretics Risk of deterioration without these interventions is high  I personally spent 31 minutes providing critical care not including any separately billable procedures  MD [facility] 03/06/2020 9:21 AM Personal pager: [phone] If unanswered, please page CCM On-call: #[phone]       NAME:  Nick Armel, MRN:  [name], DOB:  15-Jan-1966, LOS: 10 ADMISSION DATE:  02/25/2020, CONSULTATION DATE:  02/25/2020 REFERRING MD:  13/09/2019, MD, CHIEF COMPLAINT:  SOB  Brief History   54yo female with history of COPD presented with shortness of breath, intubated in ED.   History of present illness   54yo female with PMH COPD (no history of smoking with 4 admissions for exacerbation over the last year on  supplemental 2L at home, hypertension who presented via EMS in respiratory distress and hypercapnic respiratory failure requiring intubation.   Patient awake off sedation at bedside due to earlier hypotension. She had increased shortness of breath this morning, she denies chest pain. She endorses right abdominal pain, which she states is chronic. She also has chronic constipation with last BM three days ago. She has not noted any blood in her stool.   Discussed with husband over the phone. He states she has a lot of anxiety at baseline and is not sleeping at night, which he thinks led to this. She had sudden SOB this morning and was desatting to 35%, although unclear if perhaps he meant respirations, and he tried to turn up her home supplemental oxygen but she only got progressively worst. He states she has not had recent cough, sick contact, fever that he knows of. She has been using her inhalers. She chronically has shortness of breath and can only walk to and from the bathroom. He states she has had increased abdominal discomfort after eating recently but that this is ongoing.   In the the ER she was intubated with initial propofol leading to drop in blood pressure, which was stopped. She was started on levophed as well and broad spectrum antibiotics.   Past Medical History  COPD Asthma Hypertension   Significant Hospital Events   Admit 11/6  Consults:    Procedures:  ETT 11/6 >>11/12 Trach 11/12 after failed extubation trial Significant Diagnostic Tests:  CXR 11/6 >> enlarged lung volumes, atelectasis   10/14/2018-pulmonary function test-FVC  1.08 (45% predicted) postbronchodilator ratio 57, postbronchodilator FEV1 0.56 (29% predicted), no bronchodilator response   11/9 CT head>> No acute intracranial abnormality.  Moderate for age cerebral white matter changes, including chronic involvement of the corpus callosum. This is nonspecific but most commonly due to chronic small vessel  disease.  11/9 CT chest/ abd/pelvis>>No acute cardiopulmonary findings. Calcified mediastinal lymph nodes and multiple pleural base nodule. Favor chronic pulmonary findings.  No evidence of bowel obstruction or inflammation. Small amount free fluid the pelvis.   Micro Data:   11/6 Blood culture >> neg 11/6 MRSA>>neg 11/6 Covid and flu>>negative  Antimicrobials:  Vanc 11/6 Cefepime 11/6 Metro 11/6 Azithro 11/6 >>11/10 Ceftriaxone 11/6 >> 11/12  Interim history/subjective:  Seen sitting up in chair and working with PT. Feeling well this morning, no abdominal pain or anxiety. Off anxiety gtt all yesterday, had some requirement overnight, weaned again this am.   Objective   Blood pressure 109/74, pulse 95, temperature 98 F (36.7 C), temperature source Oral, resp. rate (!) 35, height 5\' 4"  (1.626 m), weight 61.2 kg, SpO2 100 %. CVP:  [17 mmHg] 17 mmHg  Vent Mode: SIMV;PRVC;PSV FiO2 (%):  [40 %] 40 % Set Rate:  [8 bmp] 8 bmp Vt Set:  [430 mL] 430 mL PEEP:  [5 cmH20] 5 cmH20 Pressure Support:  [10 cmH20] 10 cmH20 Plateau Pressure:  [22 cmH20-29 cmH20] 22 cmH20   Intake/Output Summary (Last 24 hours) at 03/06/2020 0653 Last data filed at 03/06/2020 0500 Gross per 24 hour  Intake 960.95 ml  Output 1852 ml  Net -891.05 ml   Filed Weights   03/04/20 0500 03/05/20 0424 03/06/20 0335  Weight: 58.8 kg 59.4 kg 61.2 kg    Constitutional: middle aged woman in NAD, awake and alert, sitting up in chair  Eyes: PERRL, eom intact Ears, nose, mouth, and throat: trach in place, small thick secretions Cardiovascular: RRR, ext warm, +2 pitting edema  Respiratory: CTA, SIMV breathing on her own with vent  Gastrointestinal: distended but soft, +BS, NTTP Skin: No rashes, normal turgor Neurologic: alert, answering questions appropriately with yes/no Psychiatric: calm appearing   Resolved Hospital Problem list   Shock  Assessment & Plan:   Severe COPD on vent- failed extubation 11/12,  after lengthy discussion with husband proceeded with tracheostomy to facilitate ventilator weaning. Baseline anxiety, chronic pain, depression- complicates weaning efforts - Continue rovana/yupelri/pulmicort - cont steroid taper - Restart home lexapro, PO klonipin- symptoms of anxiety improved today  - d/c fent gtt and propofol - fent pushes prn anxiety  - cont. TF  Pleural nodules- 3 mo f/u scan (~Feb 2022)  Abd pain Ileus resolved  - d/c reglan   - PTA PPI, H2B - decrease miralax to prn  - cont. Senokot bid - dulcolax ad prn  Best practice:  Diet: TF Pain/Anxiety/Delirium protocol (if indicated): see above  VAP protocol (if indicated): ordered DVT prophylaxis: SCD's GI prophylaxis: pepcid, PPI (PTA) Glucose control: SSI Mobility: bedrest Code Status: full Family Communication: husband updated 11/16 at bedside Disposition: ICU    12/16, DO 03/06/2020, 6:53 AM Pager: [phone]

## 2020-03-06 NOTE — Progress Notes (Signed)
eLink Physician-Brief Progress Note Patient Name: Kathleen Cameron DOB: [birth year] MRN: [id number]   Date of Service  03/06/2020  HPI/Events of Note  Hypertension - BP = 185/93 by A-line. Currently on Hydralazine IV PRN without improvement. Patient is about 8 liters positive since admission.   eICU Interventions  Plan: 1. Lasix 40 mg IV X 1 now. 2. Restart home Amlodipine 5 mg per tube now and Q day.      Intervention Category Major Interventions: Hypertension - evaluation and management  Gal Smolinski Eugene 03/06/2020, 12:11 AM

## 2020-03-06 NOTE — Progress Notes (Signed)
Physical Therapy Treatment Patient Details Name: Kathleen Cameron MRN: [id number] DOB: 06/27/1965 Today's Date: 03/06/2020    History of Present Illness 54 y.o. female presented 02/25/20 with shortness of breath and intubated in ED. ETT on 11/6-11/12, failed extubation, put on trach 11/12; ileus with NG tube 11/13 PMH- COPD on 2 L O2 (4 admissions in past year due to COPD exacerbation), hypertension, depression/anxiety, hld, recent shingles infection,    PT Comments    Pt awake, communicating with head nods and mouthing words with spouse Earvin Hansen in room. Pt able to transition OOB to chair today with mod +2 assist and decreased quad control in standing. Pt with assist to perform bil LE seated exercises and encouraged to increase mobility with nursing. Pt progressing with mobility and function and CIR remains appropriate as respiratory function improves.   Pt on vent via trach with FiO2 40% on SIMV, peep 5, RR 28, SpO2 100% Hr 94-111 Pre BP 135/92, post 161/96   Follow Up Recommendations  CIR     Equipment Recommendations  Rolling walker with 5" wheels;3in1 (PT)    Recommendations for Other Services       Precautions / Restrictions Precautions Precautions: Fall;Other (comment) Precaution Comments: trach, NG Restrictions Weight Bearing Restrictions: No    Mobility  Bed Mobility Overal bed mobility: Needs Assistance Bed Mobility: Supine to Sit     Supine to sit: Mod assist     General bed mobility comments: assist to bring legs fully off bed with semi roll to right to exit bed and assist to elevate trunk  Transfers Overall transfer level: Needs assistance   Transfers: Sit to/from Stand;Stand Pivot Transfers Sit to Stand: Min assist Stand pivot transfers: Mod assist;+2 physical assistance       General transfer comment: min assist to stand from bed x 2 trials and mod +2 with bil UE support and use of pad at sacrum to pivot from bed to chair toward left, pt with  partial knee buckling in standing with bil knees blocked to pivot  Ambulation/Gait                 Stairs             Wheelchair Mobility    Modified Rankin (Stroke Patients Only)       Balance Overall balance assessment: Needs assistance Sitting-balance support: No upper extremity supported;Feet supported Sitting balance-Leahy Scale: Fair     Standing balance support: Bilateral upper extremity supported Standing balance-Leahy Scale: Poor Standing balance comment: bil UE support in standing                            Cognition Arousal/Alertness: Awake/alert Behavior During Therapy: WFL for tasks assessed/performed Overall Cognitive Status: Difficult to assess                                 General Comments: smiling, cooperative, eager to sit      Exercises General Exercises - Lower Extremity Long Arc Quad: AAROM;Both;Seated;10 reps Hip Flexion/Marching: AAROM;Both;Seated;10 reps    General Comments        Pertinent Vitals/Pain Pain Assessment: No/denies pain    Home Living                      Prior Function            PT Goals (current goals can now  be found in the care plan section) Progress towards PT goals: Progressing toward goals    Frequency    Min 3X/week      PT Plan Current plan remains appropriate    Co-evaluation              AM-PAC PT "6 Clicks" Mobility   Outcome Measure  Help needed turning from your back to your side while in a flat bed without using bedrails?: A Lot Help needed moving from lying on your back to sitting on the side of a flat bed without using bedrails?: A Lot Help needed moving to and from a bed to a chair (including a wheelchair)?: A Lot Help needed standing up from a chair using your arms (e.g., wheelchair or bedside chair)?: A Lot Help needed to walk in hospital room?: Total Help needed climbing 3-5 steps with a railing? : Total 6 Click Score: 10     End of Session   Activity Tolerance: Patient tolerated treatment well Patient left: in chair;with call bell/phone within reach;with chair alarm set;with nursing/sitter in room;with family/visitor present Nurse Communication: Mobility status PT Visit Diagnosis: Muscle weakness (generalized) (M62.81);Difficulty in walking, not elsewhere classified (R26.2)     Time: 7416-3845 PT Time Calculation (min) (ACUTE ONLY): 30 min  Charges:  $Therapeutic Activity: 23-37 mins                     Xxavier Noon P, PT Acute Rehabilitation Services Pager: [phone] Office: [phone]    Mickenzie Stolar B Anh Mangano 03/06/2020, 10:26 AM

## 2020-03-06 NOTE — Evaluation (Addendum)
Occupational Therapy Evaluation Patient Details Name: Kathleen Cameron MRN: [id number] DOB: May 11, 1965 Today's Date: 03/06/2020    History of Present Illness 54 y.o. female presented 02/25/20 with shortness of breath and intubated in ED. ETT on 11/6-11/12, failed extubation, put on trach 11/12; ileus with NG tube 11/13 PMH- COPD on 2 L O2 (4 admissions in past year due to COPD exacerbation), hypertension, depression/anxiety, hld, recent shingles infection,   Clinical Impression   PTA patient reports needing some assist for ADLs from spouse or daughter, walking within her home "a little".  Admitted for above and limited by problem list below, including generalized weakness, decreased activity tolerance, impaired balance and impaired cognition.  She is oriented to time, follows simple commands with increased time, but some decreased attention, problem solving and slow processing noted--although difficult to assess due to pt on vent via trach and mouthing words only.  Patient requires max-total assist for ADLs at this time, pt declined standing from recliner to transferring back to bed today (even though visibly fatigued). Completed UB exercises as listed below, requires AAROM to complete due to fatigue.  VSS on vent via trach-- 40% FIO2, FiO2, PEEP 5; SIMV, PRVC, PSV mode. Believe she will benefit from continued OT services while admitted and after dc at CIR level to optimize independence, safety with ADLs, mobility prior to returning home with support.     Follow Up Recommendations  CIR;Supervision/Assistance - 24 hour    Equipment Recommendations  None recommended by OT    Recommendations for Other Services Rehab consult     Precautions / Restrictions Precautions Precautions: Fall;Other (comment) Precaution Comments: trach, NG Restrictions Weight Bearing Restrictions: No      Mobility Bed Mobility Overal bed mobility: Needs Assistance Bed Mobility: Supine to Sit     Supine to  sit: Mod assist     General bed mobility comments: OOB in recliner upon entry     Transfers Overall transfer level: Needs assistance   Transfers: Sit to/from Stand;Stand Pivot Transfers Sit to Stand: Min assist Stand pivot transfers: Mod assist;+2 physical assistance       General transfer comment: deferred, pt declined standing or transfer back to bed    Balance Overall balance assessment: Needs assistance Sitting-balance support: No upper extremity supported;Feet supported Sitting balance-Leahy Scale: Fair     Standing balance support: Bilateral upper extremity supported Standing balance-Leahy Scale: Poor Standing balance comment: bil UE support in standing                           ADL either performed or assessed with clinical judgement   ADL Overall ADL's : Needs assistance/impaired     Grooming: Sitting;Moderate assistance Grooming Details (indicate cue type and reason): to wipe nose  Upper Body Bathing: Moderate assistance;Sitting   Lower Body Bathing: Maximal assistance;Sitting/lateral leans   Upper Body Dressing : Maximal assistance;Sitting   Lower Body Dressing: Total assistance;Sitting/lateral leans     Toilet Transfer Details (indicate cue type and reason): deferred, pt declined           General ADL Comments: pt limited by fatigue, weakness, and impaired balance     Vision         Perception     Praxis      Pertinent Vitals/Pain Pain Assessment: No/denies pain     Hand Dominance Right   Extremity/Trunk Assessment Upper Extremity Assessment Upper Extremity Assessment: Generalized weakness   Lower Extremity Assessment Lower Extremity Assessment: Defer to  PT evaluation       Communication Communication Communication: Tracheostomy   Cognition Arousal/Alertness: Awake/alert Behavior During Therapy: WFL for tasks assessed/performed Overall Cognitive Status: Difficult to assess                                  General Comments: pt fatigued but alert, following simple commands with increased time and cueing, noted decreased problem solving but difficult to assess due to vent via trach--pt mouthing words at times, attempted writing but unsucessful   General Comments       Exercises Exercises: General Upper Extremity General Exercises - Upper Extremity Shoulder Flexion: AAROM;Both;5 reps Shoulder Extension: AAROM;Both;5 reps;Seated Elbow Flexion: AROM;Both;5 reps;Seated Elbow Extension: AROM;Both;5 reps;Seated    Shoulder Instructions      Home Living Family/patient expects to be discharged to:: Private residence Living Arrangements: Spouse/significant other Available Help at Discharge: Family;Available 24 hours/day Type of Home: Apartment Home Access: Stairs to enter Entrance Stairs-Number of Steps: 1 Entrance Stairs-Rails: Right Home Layout: One level     Bathroom Shower/Tub: Chief Strategy Officer: Standard     Home Equipment: Grab bars - tub/shower;Walker - 4 wheels;Transport chair;Bedside commode   Additional Comments: per PT report      Prior Functioning/Environment Level of Independence: Needs assistance  Gait / Transfers Assistance Needed: transport chair for community level; typically walks without a device inside home ADL's / Homemaking Assistance Needed: pt reports needing assist for ADLs, spouse assisting as needed Communication / Swallowing Assistance Needed: limited due to recent trach, on vent          OT Problem List: Decreased strength;Decreased range of motion;Decreased activity tolerance;Impaired balance (sitting and/or standing);Decreased coordination;Decreased cognition;Decreased safety awareness;Decreased knowledge of use of DME or AE;Decreased knowledge of precautions;Cardiopulmonary status limiting activity      OT Treatment/Interventions: Self-care/ADL training;Energy conservation;DME and/or AE instruction;Therapeutic  activities;Cognitive remediation/compensation;Patient/family education;Balance training;Therapeutic exercise    OT Goals(Current goals can be found in the care plan section) Acute Rehab OT Goals Patient Stated Goal: nodding to "get stronger"  OT Goal Formulation: With patient Time For Goal Achievement: 03/20/20 Potential to Achieve Goals: Fair  OT Frequency: Min 2X/week   Barriers to D/C:            Co-evaluation              AM-PAC OT "6 Clicks" Daily Activity     Outcome Measure Help from another person eating meals?: Total Help from another person taking care of personal grooming?: A Lot Help from another person toileting, which includes using toliet, bedpan, or urinal?: Total Help from another person bathing (including washing, rinsing, drying)?: A Lot Help from another person to put on and taking off regular upper body clothing?: A Lot Help from another person to put on and taking off regular lower body clothing?: Total 6 Click Score: 9   End of Session Equipment Utilized During Treatment: Other (comment) (vent via trach) Nurse Communication: Mobility status  Activity Tolerance: Patient limited by fatigue Patient left: in chair;with call bell/phone within reach;with chair alarm set;with nursing/sitter in room  OT Visit Diagnosis: Other abnormalities of gait and mobility (R26.89);Muscle weakness (generalized) (M62.81);Other symptoms and signs involving cognitive function                Time: 3295-1884 OT Time Calculation (min): 20 min Charges:  OT General Charges $OT Visit: 1 Visit OT Evaluation $OT Eval High Complexity: 1  High  Barry Brunner, OT Acute Rehabilitation Services Pager [phone] Office [phone]   Chancy Milroy 03/06/2020, 1:05 PM

## 2020-03-06 NOTE — Progress Notes (Signed)
eLink Physician-Brief Progress Note Patient Name: Kathleen Cameron DOB: [birth year] MRN: [id number]   Date of Service  03/06/2020  HPI/Events of Note  Frequent liquid stools - Request for Flexiseal.   eICU Interventions  Plan: 1. Place Flexiseal.     Intervention Category Major Interventions: Other:  Lenell Antu 03/06/2020, 2:35 AM

## 2020-03-06 NOTE — TOC Initial Note (Signed)
Transition of Care [facility]) - Initial/Assessment Note    Patient Details  Name: Kathleen Cameron MRN: [id number] Date of Birth: 08-Jul-1965  [facility] CM/SW Contact:    Lawerance Sabal, RN Phone Number: 03/06/2020, 11:53 AM  Clinical Narrative:   Discussed transitions in progression with Dr Katrinka Blazing, he was agreeable to LTAC workup, identifies that it may take prolonged course to wean from vent due to COPD. Spoke w patient's spouse over the phone. Discussed level of support needed and discharge venues. Discussed role of LTAC weaning vent prior to patient progressing to rehab. Spouse acknowledged LTAC's in area and stated he would like patient to go to the "one in the hospital" if possible. He is agreeable to workup. CM notified Judeth Porch. TOC will continue to follow.                   Expected Discharge Plan: Long Term Acute Care (LTAC) Barriers to Discharge: Continued Medical Work up   Patient Goals and CMS Choice Patient states their goals for this hospitalization and ongoing recovery are:: rehabilitation prior to coming home CMS Medicare.gov Compare Post Acute Care list provided to:: Other (Comment Required) Choice offered to / list presented to : Spouse  Expected Discharge Plan and Services Expected Discharge Plan: Long Term Acute Care (LTAC)   Discharge Planning Services: CM Consult                                          Prior Living Arrangements/Services   Lives with:: Spouse                   Activities of Daily Living Home Assistive Devices/Equipment: Oxygen ADL Screening (condition at time of admission) Patient's cognitive ability adequate to safely complete daily activities?: No Is the patient deaf or have difficulty hearing?: No Does the patient have difficulty seeing, even when wearing glasses/contacts?: No Does the patient have difficulty concentrating, remembering, or making decisions?: No Patient able to express need for  assistance with ADLs?: Yes Does the patient have difficulty dressing or bathing?: No Independently performs ADLs?: No Does the patient have difficulty walking or climbing stairs?: No Weakness of Legs: Both Weakness of Arms/Hands: Both  Permission Sought/Granted                  Emotional Assessment              Admission diagnosis:  Chronic obstructive pulmonary disease with acute exacerbation (HCC) [J44.1] Acute respiratory failure with hypoxia and hypercapnia (HCC) [J96.01, J96.02] Acute hypercapnic respiratory failure (HCC) [J96.02] Patient Active Problem List   Diagnosis Date Noted  . Generalized abdominal pain   . Convulsions (HCC)   . Malnutrition of moderate degree 02/27/2020  . Hypotension   . Acute hypercapnic respiratory failure (HCC) 02/25/2020  . Right upper quadrant abdominal pain 02/01/2020  . Hypothyroidism (acquired) 02/01/2020  . Acute metabolic encephalopathy 01/29/2020  . Hyponatremia 01/28/2020  . Anxiety 12/02/2019  . COPD exacerbation (HCC) 11/06/2019  . Depression 11/06/2019  . Acute on chronic respiratory failure with hypoxia and hypercapnia (HCC) 11/06/2019  . Respiratory failure (HCC) 11/06/2019  . Current moderate episode of major depressive disorder without prior episode (HCC) 09/20/2019  . Pedal edema 08/04/2019  . Chronic respiratory failure with hypoxia (HCC) 03/07/2019  . CKD (chronic kidney disease) 01/05/2019  . COPD (chronic obstructive pulmonary disease) (HCC) 09/29/2018  .  Pulmonary nodules 09/27/2018  . Dyslipidemia 07/07/2018  . Essential hypertension 03/10/2018   PCP:  [name], MD Pharmacy:   [facility] [phone] - [place], [place] - [address] AT [facility] [address] & [address] [place] [address] Phone: [phone] Fax: [phone]     Social Determinants of Health (SDOH) Interventions    Readmission Risk Interventions Readmission Risk Prevention Plan  02/02/2020  Transportation Screening Complete  PCP or Specialist Appt within 3-5 Days Complete  HRI or Home Care Consult Complete  Social Work Consult for Recovery Care Planning/Counseling Complete  Palliative Care Screening Not Applicable  Medication Review Oceanographer) Complete  Some recent data might be hidden

## 2020-03-07 ENCOUNTER — Inpatient Hospital Stay (HOSPITAL_COMMUNITY): Payer: PRIVATE HEALTH INSURANCE

## 2020-03-07 DIAGNOSIS — J9602 Acute respiratory failure with hypercapnia: Secondary | ICD-10-CM | POA: Diagnosis not present

## 2020-03-07 LAB — POCT I-STAT 7, (LYTES, BLD GAS, ICA,H+H)
Acid-Base Excess: 18 mmol/L — ABNORMAL HIGH (ref 0.0–2.0)
Bicarbonate: 43.3 mmol/L — ABNORMAL HIGH (ref 20.0–28.0)
Calcium, Ion: 1.17 mmol/L (ref 1.15–1.40)
HCT: 35 % — ABNORMAL LOW (ref 36.0–46.0)
Hemoglobin: 11.9 g/dL — ABNORMAL LOW (ref 12.0–15.0)
O2 Saturation: 99 %
Patient temperature: 98.6
Potassium: 4.4 mmol/L (ref 3.5–5.1)
Sodium: 139 mmol/L (ref 135–145)
TCO2: 45 mmol/L — ABNORMAL HIGH (ref 22–32)
pCO2 arterial: 50 mmHg — ABNORMAL HIGH (ref 32.0–48.0)
pH, Arterial: 7.545 — ABNORMAL HIGH (ref 7.350–7.450)
pO2, Arterial: 143 mmHg — ABNORMAL HIGH (ref 83.0–108.0)

## 2020-03-07 LAB — BASIC METABOLIC PANEL
Anion gap: 7 (ref 5–15)
BUN: 15 mg/dL (ref 6–20)
CO2: 38 mmol/L — ABNORMAL HIGH (ref 22–32)
Calcium: 9 mg/dL (ref 8.9–10.3)
Chloride: 98 mmol/L (ref 98–111)
Creatinine, Ser: 0.59 mg/dL (ref 0.44–1.00)
GFR, Estimated: >60 mL/min (ref >60)
Glucose, Bld: 94 mg/dL (ref 70–99)
Potassium: 3.4 mmol/L — ABNORMAL LOW (ref 3.5–5.1)
Sodium: 143 mmol/L (ref 135–145)

## 2020-03-07 LAB — CBC
HCT: 32 % — ABNORMAL LOW (ref 36.0–46.0)
Hemoglobin: 9.6 g/dL — ABNORMAL LOW (ref 12.0–15.0)
MCH: 29.3 pg (ref 26.0–34.0)
MCHC: 30 g/dL (ref 30.0–36.0)
MCV: 97.6 fL (ref 80.0–100.0)
Platelets: 261 10*3/uL (ref 150–400)
RBC: 3.28 MIL/uL — ABNORMAL LOW (ref 3.87–5.11)
RDW: 15.8 % — ABNORMAL HIGH (ref 11.5–15.5)
WBC: 9 10*3/uL (ref 4.0–10.5)
nRBC: 0 % (ref 0.0–0.2)

## 2020-03-07 LAB — GLUCOSE, CAPILLARY
Glucose-Capillary: 101 mg/dL — ABNORMAL HIGH (ref 70–99)
Glucose-Capillary: 101 mg/dL — ABNORMAL HIGH (ref 70–99)
Glucose-Capillary: 114 mg/dL — ABNORMAL HIGH (ref 70–99)
Glucose-Capillary: 118 mg/dL — ABNORMAL HIGH (ref 70–99)
Glucose-Capillary: 126 mg/dL — ABNORMAL HIGH (ref 70–99)
Glucose-Capillary: 161 mg/dL — ABNORMAL HIGH (ref 70–99)
Glucose-Capillary: 164 mg/dL — ABNORMAL HIGH (ref 70–99)
Glucose-Capillary: 61 mg/dL — ABNORMAL LOW (ref 70–99)
Glucose-Capillary: 79 mg/dL (ref 70–99)

## 2020-03-07 LAB — MAGNESIUM: Magnesium: 2 mg/dL (ref 1.7–2.4)

## 2020-03-07 LAB — T4, FREE: Free T4: 0.59 ng/dL — ABNORMAL LOW (ref 0.61–1.12)

## 2020-03-07 LAB — HEPATIC FUNCTION PANEL
ALT: 22 U/L (ref 0–44)
AST: 21 U/L (ref 15–41)
Albumin: 2.8 g/dL — ABNORMAL LOW (ref 3.5–5.0)
Alkaline Phosphatase: 41 U/L (ref 38–126)
Bilirubin, Direct: <0.1 mg/dL (ref 0.0–0.2)
Total Bilirubin: 0.5 mg/dL (ref 0.3–1.2)
Total Protein: 5.8 g/dL — ABNORMAL LOW (ref 6.5–8.1)

## 2020-03-07 LAB — PHOSPHORUS: Phosphorus: 2.6 mg/dL (ref 2.5–4.6)

## 2020-03-07 LAB — CORTISOL: Cortisol, Plasma: 39.3 ug/dL

## 2020-03-07 LAB — TSH: TSH: 16.378 u[IU]/mL — ABNORMAL HIGH (ref 0.350–4.500)

## 2020-03-07 MED ORDER — MIDODRINE HCL 5 MG PO TABS
5.0000 mg | ORAL_TABLET | Freq: Two times a day (BID) | ORAL | Status: DC
  Administered 2020-03-07: 5 mg via ORAL
  Filled 2020-03-07: qty 1

## 2020-03-07 MED ORDER — ROSUVASTATIN CALCIUM 20 MG PO TABS: 40.0000 mg | ORAL_TABLET | Freq: Every day | ORAL | Status: DC

## 2020-03-07 MED ORDER — VENLAFAXINE HCL 37.5 MG PO TABS
37.5000 mg | ORAL_TABLET | Freq: Two times a day (BID) | ORAL | Status: DC
  Administered 2020-03-07 – 2020-03-08 (×3): 37.5 mg
  Filled 2020-03-07 (×5): qty 1

## 2020-03-07 MED ORDER — BISOPROLOL FUMARATE 10 MG PO TABS
10.0000 mg | ORAL_TABLET | Freq: Every day | ORAL | Status: DC
  Administered 2020-03-07 – 2020-03-08 (×2): 10 mg
  Filled 2020-03-07 (×2): qty 1

## 2020-03-07 MED ORDER — METOCLOPRAMIDE HCL 10 MG PO TABS
10.0000 mg | ORAL_TABLET | Freq: Three times a day (TID) | ORAL | Status: DC
  Administered 2020-03-07 – 2020-03-08 (×5): 10 mg
  Filled 2020-03-07 (×7): qty 1

## 2020-03-07 MED ORDER — PANTOPRAZOLE SODIUM 40 MG PO PACK
40.0000 mg | PACK | Freq: Every day | ORAL | Status: DC
  Administered 2020-03-07: 40 mg
  Filled 2020-03-07 (×2): qty 20

## 2020-03-07 MED ORDER — FAMOTIDINE 40 MG/5ML PO SUSR
20.0000 mg | Freq: Two times a day (BID) | ORAL | Status: DC
  Administered 2020-03-07 – 2020-03-08 (×3): 20 mg
  Filled 2020-03-07 (×3): qty 2.5

## 2020-03-07 MED ORDER — SODIUM CHLORIDE 0.9 % IV BOLUS
250.0000 mL | Freq: Once | INTRAVENOUS | Status: AC
  Administered 2020-03-07: 250 mL via INTRAVENOUS

## 2020-03-07 MED ORDER — CLONAZEPAM 1 MG PO TABS: 1.0000 mg | ORAL_TABLET | Freq: Three times a day (TID) | ORAL | Status: DC

## 2020-03-07 MED ORDER — POTASSIUM CHLORIDE 20 MEQ PO PACK
40.0000 meq | PACK | Freq: Two times a day (BID) | ORAL | Status: AC
  Administered 2020-03-07 (×2): 40 meq
  Filled 2020-03-07 (×2): qty 2

## 2020-03-07 MED ORDER — PREDNISONE 20 MG PO TABS: 20.0000 mg | ORAL_TABLET | Freq: Every day | ORAL | Status: DC

## 2020-03-07 MED ORDER — TEMAZEPAM 15 MG PO CAPS
15.0000 mg | ORAL_CAPSULE | Freq: Every day | ORAL | Status: DC
  Administered 2020-03-07: 15 mg
  Filled 2020-03-07: qty 1

## 2020-03-07 MED ORDER — POTASSIUM CHLORIDE 20 MEQ PO PACK
40.0000 meq | PACK | Freq: Two times a day (BID) | ORAL | Status: DC
  Filled 2020-03-07: qty 2

## 2020-03-07 MED ORDER — DEXTROSE 50 % IV SOLN
12.5000 g | INTRAVENOUS | Status: AC
  Administered 2020-03-07: 12.5 g via INTRAVENOUS

## 2020-03-07 MED ORDER — ROSUVASTATIN CALCIUM 20 MG PO TABS
40.0000 mg | ORAL_TABLET | Freq: Every day | ORAL | Status: DC
  Administered 2020-03-07: 40 mg
  Filled 2020-03-07: qty 2

## 2020-03-07 MED ORDER — POTASSIUM CHLORIDE 20 MEQ PO PACK
40.0000 meq | PACK | Freq: Once | ORAL | Status: AC
  Administered 2020-03-07: 40 meq
  Filled 2020-03-07: qty 2

## 2020-03-07 MED ORDER — TEMAZEPAM 30 MG PO CAPS: 30.0000 mg | ORAL_CAPSULE | Freq: Every day | ORAL | Status: DC

## 2020-03-07 MED ORDER — FUROSEMIDE 10 MG/ML IJ SOLN
40.0000 mg | Freq: Four times a day (QID) | INTRAMUSCULAR | Status: AC
  Administered 2020-03-07: 40 mg via INTRAVENOUS
  Filled 2020-03-07: qty 4

## 2020-03-07 MED ORDER — DEXTROSE 50 % IV SOLN
INTRAVENOUS | Status: AC
  Filled 2020-03-07: qty 50

## 2020-03-07 MED ORDER — AMLODIPINE BESYLATE 10 MG PO TABS
10.0000 mg | ORAL_TABLET | Freq: Every day | ORAL | Status: DC
  Filled 2020-03-07: qty 1

## 2020-03-07 MED ORDER — METOLAZONE 2.5 MG PO TABS
5.0000 mg | ORAL_TABLET | Freq: Once | ORAL | Status: AC
  Administered 2020-03-07: 5 mg via ORAL
  Filled 2020-03-07: qty 2

## 2020-03-07 MED ORDER — METHYLPREDNISOLONE SODIUM SUCC 40 MG IJ SOLR: 20.0000 mg | Freq: Once | INTRAMUSCULAR | Status: DC

## 2020-03-07 MED ORDER — METHYLPREDNISOLONE SODIUM SUCC 125 MG IJ SOLR: 60.0000 mg | Freq: Three times a day (TID) | INTRAMUSCULAR | Status: DC

## 2020-03-07 MED ORDER — SODIUM CHLORIDE 0.9 % IV SOLN: INTRAVENOUS | Status: DC | PRN

## 2020-03-07 MED ORDER — POTASSIUM CHLORIDE 20 MEQ/15ML (10%) PO SOLN: 40.0000 meq | Freq: Two times a day (BID) | ORAL | Status: DC

## 2020-03-07 MED ORDER — MIDODRINE HCL 5 MG PO TABS: 5.0000 mg | ORAL_TABLET | Freq: Two times a day (BID) | ORAL | Status: DC

## 2020-03-07 MED ORDER — ASPIRIN 81 MG PO CHEW
81.0000 mg | CHEWABLE_TABLET | Freq: Every day | ORAL | Status: DC
  Administered 2020-03-07 – 2020-03-08 (×2): 81 mg
  Filled 2020-03-07 (×2): qty 1

## 2020-03-07 MED ORDER — METHYLPREDNISOLONE SODIUM SUCC 125 MG IJ SOLR
40.0000 mg | INTRAMUSCULAR | Status: DC
  Administered 2020-03-08: 40 mg via INTRAVENOUS
  Filled 2020-03-07: qty 2

## 2020-03-07 NOTE — Progress Notes (Signed)
Patient continues to be lethargic with SBP 70's . Dr Katrinka Blazing and Guinevere Scarlet D.O updated at bedside . Will continue to monitor as long as mean BP > 60 per orders

## 2020-03-07 NOTE — Progress Notes (Signed)
eLink Physician-Brief Progress Note Patient Name: Niah Heinle DOB: June 04, 1965 MRN: [id number]   Date of Service  03/07/2020  HPI/Events of Note  Notified of K 3.4 Creatinine 0.59, Mg 2  eICU Interventions  Ordered K 40 meqs per tube x 1     Intervention Category Major Interventions: Electrolyte abnormality - evaluation and management  Darl Pikes 03/07/2020, 4:35 AM

## 2020-03-07 NOTE — Progress Notes (Addendum)
03/07/2020 I saw and evaluated the patient. Discussed with resident and agree with resident's findings and plan as documented in the resident's note.  I have seen and evaluated the patient for severe COPD on ventilator.  S:  Having panic-like episode this am, HR up, air trapping, tachypneic. Abd distended and burping intermittently. Ongoing stool  O: Blood pressure (!) 177/106, pulse (!) 120, temperature (!) 97.5 F (36.4 C), temperature source Axillary, resp. rate (!) 24, height 5\' 4"  (1.626 m), weight 60.5 kg, SpO2 99 %.  Anxious woman on ventilator Lung sounds severely diminished Abd distended, +BS but tympanic to percussion Ongoing loose stool in rectal tube Ext 1+ edema  Sugars are dipping low K low, Sodium high, CO2 high Net +6L for admission  A:  -Severe COPD with recurrent exacerbations unable to wean from ventilator now s/p tracheostomy. -Intermittent panic attacks complicating care -Muscular deconditioning -I suspect longstanding gastroparesis not helping -Ileus resolved  P:  - Restart reglan - Trial of PS if able - PRN benzodiazepines for panic attacks, try to avoid drips - Steroid taper and nebs as ordered - Switch SSRI to SNRI - Diuresis challenge - Medically stable for LTACH   Patient critically ill due to respiratory failure Interventions to address this today ventilator weaning, sedation weaning Risk of deterioration without these interventions is high  I personally spent 32 minutes providing critical care not including any separately billable procedures  MD [facility] 03/07/2020 9:09 AM Personal pager: 03/09/2020 If unanswered, please page CCM On-call: #[phone]   03/07/2020 03/09/2020 MD      NAME:  Debar Plate, MRN:  [name], DOB:  1965/12/29, LOS: 11 ADMISSION DATE:  02/25/2020, CONSULTATION DATE:  02/25/2020 REFERRING MD:  13/09/2019, MD, CHIEF COMPLAINT:  SOB  Brief History   54yo  female with history of COPD presented with shortness of breath, intubated in ED.   History of present illness   54yo female with PMH COPD (no history of smoking with 4 admissions for exacerbation over the last year on supplemental 2L at home, hypertension who presented via EMS in respiratory distress and hypercapnic respiratory failure requiring intubation.   Patient awake off sedation at bedside due to earlier hypotension. She had increased shortness of breath this morning, she denies chest pain. She endorses right abdominal pain, which she states is chronic. She also has chronic constipation with last BM three days ago. She has not noted any blood in her stool.   Discussed with husband over the phone. He states she has a lot of anxiety at baseline and is not sleeping at night, which he thinks led to this. She had sudden SOB this morning and was desatting to 35%, although unclear if perhaps he meant respirations, and he tried to turn up her home supplemental oxygen but she only got progressively worst. He states she has not had recent cough, sick contact, fever that he knows of. She has been using her inhalers. She chronically has shortness of breath and can only walk to and from the bathroom. He states she has had increased abdominal discomfort after eating recently but that this is ongoing.   In the the ER she was intubated with initial propofol leading to drop in blood pressure, which was stopped. She was started on levophed as well and broad spectrum antibiotics.   Past Medical History  COPD Asthma Hypertension   Significant Hospital Events   Admit 11/6  Consults:    Procedures:  ETT 11/6 >>11/12  Trach 11/12 after failed extubation trial Significant Diagnostic Tests:  CXR 11/6 >> enlarged lung volumes, atelectasis   10/14/2018-pulmonary function test-FVC 1.08 (45% predicted) postbronchodilator ratio 57, postbronchodilator FEV1 0.56 (29% predicted), no bronchodilator response   11/9  CT head>> No acute intracranial abnormality.  Moderate for age cerebral white matter changes, including chronic involvement of the corpus callosum. This is nonspecific but most commonly due to chronic small vessel disease.  11/9 CT chest/ abd/pelvis>>No acute cardiopulmonary findings. Calcified mediastinal lymph nodes and multiple pleural base nodule. Favor chronic pulmonary findings.  No evidence of bowel obstruction or inflammation. Small amount free fluid the pelvis.   Micro Data:   11/6 Blood culture >> neg 11/6 MRSA>>neg 11/6 Covid and flu>>negative  Antimicrobials:  Vanc 11/6 Cefepime 11/6 Metro 11/6 Azithro 11/6 >>11/10 Ceftriaxone 11/6 >> 11/12  Interim history/subjective:  Having increased abdominal pain today, belching. Continuing to have BM.   Objective   Blood pressure (!) 177/106, pulse (!) 120, temperature 98.4 F (36.9 C), temperature source Oral, resp. rate (!) 24, height 5\' 4"  (1.626 m), weight 60.5 kg, SpO2 98 %.    Vent Mode: SIMV;PSV;PRVC FiO2 (%):  [40 %] 40 % Set Rate:  [8 bmp] 8 bmp Vt Set:  [430 mL] 430 mL PEEP:  [5 cmH20] 5 cmH20 Pressure Support:  [10 cmH20] 10 cmH20 Plateau Pressure:  [12 cmH20-16 cmH20] 16 cmH20   Intake/Output Summary (Last 24 hours) at 03/07/2020 0649 Last data filed at 03/07/2020 0606 Gross per 24 hour  Intake 1400 ml  Output 2450 ml  Net -1050 ml   Filed Weights   03/05/20 0424 03/06/20 0335 03/07/20 0417  Weight: 59.4 kg 61.2 kg 60.5 kg   Constitutional: middle aged woman in mild distress, acutely ill appearing, sedated Eyes: PERRL, eom intact Ears, nose, mouth, and throat: trach in place, NG tube in place Cardiovascular: tachycardic, regular rhythm, ext warm, +2 pitting edema  Respiratory: CTA, SIMV breathing on her own with vent  Gastrointestinal: distended but soft, hyperactive BS, TTP Skin: No rashes, normal turgor Neurologic: alert, answering questions appropriately with yes/no   Resolved Hospital Problem  list   Shock  Assessment & Plan:   Severe COPD on vent- failed extubation 11/12, after lengthy discussion with husband proceeded with tracheostomy to facilitate ventilator weaning. Baseline anxiety, chronic pain, depression- complicates weaning efforts - Continue rovana/yupelri/pulmicort - cont steroid taper - cont. home lexapro, PO klonipin - symptoms of anxiety improved today, additional prns - 30 mg temazepam qhs  - fen pushes prn anxiety  - cont. TF - increase lasix to 40 mg bid today  Pleural nodules- 3 mo f/u scan (~Feb 2022)  Abd pain, Ileus Hypokalemia Chronic issue at home. Pain increased today. Having BMs. -cont. K repletion  - check ECG, will restart reglan if no prolonged qt  - PTA PPI, H2B - decrease miralax to prn  - cont. Senokot prn - dulcolax ad prn  Best practice:  Diet: TF Pain/Anxiety/Delirium protocol (if indicated): see above  VAP protocol (if indicated): ordered DVT prophylaxis: SCD's GI prophylaxis: pepcid, PPI (PTA) Glucose control: SSI Mobility: OOB Code Status: full Family Communication: daughter updated 11/17 at bedside Disposition: ICU    12/17, DO 03/07/2020, 6:49 AM Pager: [phone]

## 2020-03-07 NOTE — Progress Notes (Signed)
SBP 76- 94 , patient continues to be lethargic . Guinevere Scarlet D.O at bedside . Continue to monitor and received order for prn NS bolus for mean BP < 65

## 2020-03-07 NOTE — Progress Notes (Signed)
Patient with decreased blood pressures and decreased responsiveness. Dr Katrinka Blazing and Guinevere Scarlet D.O. updated .Midodrine given pr orders . Will continue to monitor

## 2020-03-07 NOTE — Progress Notes (Signed)
Hypoglycemic Event  CBG: 63 & 60 (checked twice on both hands)  Treatment: D50 25 mL (12.5 gm)  Symptoms: None  Follow-up CBG: Time:0005 CBG Result:101  Possible Reasons for Event: Unknown  Comments/MD notified:E-link notified    Kathlene November

## 2020-03-07 NOTE — Progress Notes (Signed)
SLP Cancellation Note  Patient Details Name: Aaliah Jorgenson MRN: [id number] DOB: Jun 25, 1965   Cancelled treatment:        With RT to attempt inline PMV. BP was low; repeated reading and 88/68. Will defer tx.   [name] 03/07/2020, 3:14 PM [name] [name].Ed Nurse, children's [phone] Office [phone]

## 2020-03-08 ENCOUNTER — Other Ambulatory Visit: Payer: Self-pay | Admitting: Emergency Medicine

## 2020-03-08 ENCOUNTER — Other Ambulatory Visit (HOSPITAL_COMMUNITY): Payer: PRIVATE HEALTH INSURANCE

## 2020-03-08 ENCOUNTER — Inpatient Hospital Stay
Admission: RE | Admit: 2020-03-08 | Discharge: 2020-04-10 | Disposition: A | Discharge status: Other Acute Inpatient Hospital | Payer: PRIVATE HEALTH INSURANCE | Source: Other Acute Inpatient Hospital | Attending: Internal Medicine | Admitting: Internal Medicine

## 2020-03-08 DIAGNOSIS — J449 Chronic obstructive pulmonary disease, unspecified: Secondary | ICD-10-CM

## 2020-03-08 DIAGNOSIS — J811 Chronic pulmonary edema: Secondary | ICD-10-CM

## 2020-03-08 DIAGNOSIS — J9690 Respiratory failure, unspecified, unspecified whether with hypoxia or hypercapnia: Secondary | ICD-10-CM

## 2020-03-08 DIAGNOSIS — J189 Pneumonia, unspecified organism: Secondary | ICD-10-CM

## 2020-03-08 DIAGNOSIS — J9600 Acute respiratory failure, unspecified whether with hypoxia or hypercapnia: Secondary | ICD-10-CM | POA: Diagnosis present

## 2020-03-08 DIAGNOSIS — G40909 Epilepsy, unspecified, not intractable, without status epilepticus: Secondary | ICD-10-CM

## 2020-03-08 DIAGNOSIS — K567 Ileus, unspecified: Secondary | ICD-10-CM

## 2020-03-08 DIAGNOSIS — J9621 Acute and chronic respiratory failure with hypoxia: Secondary | ICD-10-CM | POA: Diagnosis present

## 2020-03-08 DIAGNOSIS — Z0189 Encounter for other specified special examinations: Secondary | ICD-10-CM

## 2020-03-08 DIAGNOSIS — Z931 Gastrostomy status: Secondary | ICD-10-CM

## 2020-03-08 DIAGNOSIS — J90 Pleural effusion, not elsewhere classified: Secondary | ICD-10-CM

## 2020-03-08 DIAGNOSIS — R1010 Upper abdominal pain, unspecified: Secondary | ICD-10-CM

## 2020-03-08 DIAGNOSIS — J9602 Acute respiratory failure with hypercapnia: Secondary | ICD-10-CM | POA: Diagnosis not present

## 2020-03-08 DIAGNOSIS — R109 Unspecified abdominal pain: Secondary | ICD-10-CM

## 2020-03-08 DIAGNOSIS — F419 Anxiety disorder, unspecified: Secondary | ICD-10-CM | POA: Diagnosis present

## 2020-03-08 DIAGNOSIS — I509 Heart failure, unspecified: Secondary | ICD-10-CM

## 2020-03-08 DIAGNOSIS — R0602 Shortness of breath: Secondary | ICD-10-CM

## 2020-03-08 HISTORY — DX: Anxiety disorder, unspecified: F41.9

## 2020-03-08 HISTORY — DX: Acute and chronic respiratory failure with hypoxia: J96.21

## 2020-03-08 HISTORY — DX: Chronic obstructive pulmonary disease, unspecified: J44.9

## 2020-03-08 HISTORY — DX: Epilepsy, unspecified, not intractable, without status epilepticus: G40.909

## 2020-03-08 LAB — BASIC METABOLIC PANEL
Anion gap: 7 (ref 5–15)
BUN: 25 mg/dL — ABNORMAL HIGH (ref 6–20)
CO2: 36 mmol/L — ABNORMAL HIGH (ref 22–32)
Calcium: 8.7 mg/dL — ABNORMAL LOW (ref 8.9–10.3)
Chloride: 97 mmol/L — ABNORMAL LOW (ref 98–111)
Creatinine, Ser: 0.71 mg/dL (ref 0.44–1.00)
GFR, Estimated: >60 mL/min (ref >60)
Glucose, Bld: 111 mg/dL — ABNORMAL HIGH (ref 70–99)
Potassium: 4.3 mmol/L (ref 3.5–5.1)
Sodium: 140 mmol/L (ref 135–145)

## 2020-03-08 LAB — BLOOD GAS, ARTERIAL
Acid-Base Excess: 16.7 mmol/L — ABNORMAL HIGH (ref 0.0–2.0)
Bicarbonate: 41.5 mmol/L — ABNORMAL HIGH (ref 20.0–28.0)
FIO2: 40
O2 Saturation: 99 %
Patient temperature: 36.1
pCO2 arterial: 52.8 mmHg — ABNORMAL HIGH (ref 32.0–48.0)
pH, Arterial: 7.503 — ABNORMAL HIGH (ref 7.350–7.450)
pO2, Arterial: 114 mmHg — ABNORMAL HIGH (ref 83.0–108.0)

## 2020-03-08 LAB — CBC
HCT: 25.7 % — ABNORMAL LOW (ref 36.0–46.0)
Hemoglobin: 7.9 g/dL — ABNORMAL LOW (ref 12.0–15.0)
MCH: 29.4 pg (ref 26.0–34.0)
MCHC: 30.7 g/dL (ref 30.0–36.0)
MCV: 95.5 fL (ref 80.0–100.0)
Platelets: 189 10*3/uL (ref 150–400)
RBC: 2.69 MIL/uL — ABNORMAL LOW (ref 3.87–5.11)
RDW: 15.9 % — ABNORMAL HIGH (ref 11.5–15.5)
WBC: 8.4 10*3/uL (ref 4.0–10.5)
nRBC: 0 % (ref 0.0–0.2)

## 2020-03-08 LAB — MAGNESIUM: Magnesium: 1.8 mg/dL (ref 1.7–2.4)

## 2020-03-08 LAB — GLUCOSE, CAPILLARY
Glucose-Capillary: 105 mg/dL — ABNORMAL HIGH (ref 70–99)
Glucose-Capillary: 109 mg/dL — ABNORMAL HIGH (ref 70–99)
Glucose-Capillary: 164 mg/dL — ABNORMAL HIGH (ref 70–99)
Glucose-Capillary: 66 mg/dL — ABNORMAL LOW (ref 70–99)
Glucose-Capillary: 76 mg/dL (ref 70–99)
Glucose-Capillary: 85 mg/dL (ref 70–99)

## 2020-03-08 LAB — PHOSPHORUS: Phosphorus: 1.3 mg/dL — ABNORMAL LOW (ref 2.5–4.6)

## 2020-03-08 MED ORDER — ENOXAPARIN SODIUM 40 MG/0.4ML ~~LOC~~ SOLN
40.0000 mg | SUBCUTANEOUS | Status: DC
Start: 2020-03-09 — End: 2020-07-02

## 2020-03-08 MED ORDER — FAMOTIDINE 40 MG/5ML PO SUSR
20.0000 mg | Freq: Two times a day (BID) | ORAL | 0 refills | Status: DC
Start: 2020-03-08 — End: 2020-06-08

## 2020-03-08 MED ORDER — SALINE SPRAY 0.65 % NA SOLN: 1.0000 | NASAL | 0 refills | Status: AC | PRN

## 2020-03-08 MED ORDER — AMLODIPINE BESYLATE 5 MG PO TABS: 10.0000 mg | ORAL_TABLET | Freq: Every day | ORAL | Status: DC

## 2020-03-08 MED ORDER — FUROSEMIDE 10 MG/ML IJ SOLN
40.0000 mg | Freq: Once | INTRAMUSCULAR | Status: AC
  Administered 2020-03-08: 40 mg via INTRAVENOUS
  Filled 2020-03-08: qty 4

## 2020-03-08 MED ORDER — BISACODYL 10 MG RE SUPP: 10.0000 mg | Freq: Every day | RECTAL | 0 refills | Status: DC | PRN

## 2020-03-08 MED ORDER — MIDAZOLAM HCL 2 MG/2ML IJ SOLN: 2.0000 mg | INTRAMUSCULAR | 0 refills | Status: DC | PRN

## 2020-03-08 MED ORDER — CHLORHEXIDINE GLUCONATE CLOTH 2 % EX PADS: 6.0000 | MEDICATED_PAD | Freq: Every day | CUTANEOUS | Status: DC

## 2020-03-08 MED ORDER — ARFORMOTEROL TARTRATE 15 MCG/2ML IN NEBU: 15.0000 ug | INHALATION_SOLUTION | Freq: Two times a day (BID) | RESPIRATORY_TRACT | Status: DC

## 2020-03-08 MED ORDER — ASPIRIN 81 MG PO CHEW
81.0000 mg | CHEWABLE_TABLET | Freq: Every day | ORAL | Status: DC
Start: 2020-03-09 — End: 2020-07-09

## 2020-03-08 MED ORDER — VENLAFAXINE HCL 37.5 MG PO TABS
37.5000 mg | ORAL_TABLET | Freq: Two times a day (BID) | ORAL | Status: DC
Start: 2020-03-08 — End: 2020-07-10

## 2020-03-08 MED ORDER — PANTOPRAZOLE SODIUM 40 MG PO PACK: 40.0000 mg | PACK | Freq: Every day | ORAL | Status: DC

## 2020-03-08 MED ORDER — POTASSIUM & SODIUM PHOSPHATES 280-160-250 MG PO PACK
1.0000 | PACK | Freq: Three times a day (TID) | ORAL | Status: DC
  Administered 2020-03-08: 1
  Filled 2020-03-08 (×2): qty 1

## 2020-03-08 MED ORDER — PHOS-NAK 280-160-250 MG PO PACK: 1.0000 | PACK | Freq: Three times a day (TID) | ORAL | 0 refills | Status: AC

## 2020-03-08 MED ORDER — CHLORHEXIDINE GLUCONATE 0.12% ORAL RINSE (MEDLINE KIT)
15.0000 mL | Freq: Two times a day (BID) | OROMUCOSAL | 0 refills | Status: DC
Start: 2020-03-08 — End: 2020-07-10

## 2020-03-08 MED ORDER — BUDESONIDE 0.25 MG/2ML IN SUSP: 0.2500 mg | Freq: Two times a day (BID) | RESPIRATORY_TRACT | 12 refills | Status: DC

## 2020-03-08 MED ORDER — PREDNISOLONE ACETATE 1 % OP SUSP: 1.0000 [drp] | Freq: Four times a day (QID) | OPHTHALMIC | 0 refills | Status: DC

## 2020-03-08 MED ORDER — OSMOLITE 1.2 CAL PO LIQD: 1000.0000 mL | ORAL | 0 refills | Status: DC

## 2020-03-08 MED ORDER — MAGNESIUM SULFATE 2 GM/50ML IV SOLN
2.0000 g | Freq: Once | INTRAVENOUS | Status: AC
  Administered 2020-03-08: 2 g via INTRAVENOUS
  Filled 2020-03-08: qty 50

## 2020-03-08 MED ORDER — POTASSIUM & SODIUM PHOSPHATES 280-160-250 MG PO PACK
1.0000 | PACK | Freq: Three times a day (TID) | ORAL | Status: DC
  Administered 2020-03-08: 1 via ORAL
  Filled 2020-03-08 (×2): qty 1

## 2020-03-08 MED ORDER — METOCLOPRAMIDE HCL 10 MG PO TABS
10.0000 mg | ORAL_TABLET | Freq: Three times a day (TID) | ORAL | Status: DC
Start: 2020-03-08 — End: 2020-07-02

## 2020-03-08 MED ORDER — CLONAZEPAM 1 MG PO TABS
1.0000 mg | ORAL_TABLET | Freq: Three times a day (TID) | ORAL | Status: DC
  Administered 2020-03-08 (×2): 1 mg
  Filled 2020-03-08 (×2): qty 1

## 2020-03-08 MED ORDER — SENNOSIDES-DOCUSATE SODIUM 8.6-50 MG PO TABS: 2.0000 | ORAL_TABLET | Freq: Every evening | ORAL | Status: DC | PRN

## 2020-03-08 MED ORDER — TEMAZEPAM 15 MG PO CAPS: 15.0000 mg | ORAL_CAPSULE | Freq: Every day | ORAL | 0 refills | Status: DC

## 2020-03-08 MED ORDER — REVEFENACIN 175 MCG/3ML IN SOLN
175.0000 ug | Freq: Every day | RESPIRATORY_TRACT | Status: DC
Start: 2020-03-09 — End: 2020-07-10

## 2020-03-08 MED ORDER — ROSUVASTATIN CALCIUM 40 MG PO TABS: 40.0000 mg | ORAL_TABLET | Freq: Every day | ORAL | Status: DC

## 2020-03-08 MED ORDER — POLYETHYLENE GLYCOL 3350 17 G PO PACK: 17.0000 g | PACK | Freq: Every day | ORAL | 0 refills | Status: AC | PRN

## 2020-03-08 MED ORDER — OSMOLITE 1.2 CAL PO LIQD
1000.0000 mL | ORAL | Status: DC
  Administered 2020-03-08: 1000 mL
  Filled 2020-03-08 (×2): qty 1000

## 2020-03-08 MED ORDER — LIP MEDEX EX OINT: TOPICAL_OINTMENT | CUTANEOUS | 0 refills | Status: AC | PRN

## 2020-03-08 MED ORDER — FENTANYL CITRATE (PF) 100 MCG/2ML IJ SOLN: 25.0000 ug | INTRAMUSCULAR | 0 refills | Status: DC | PRN

## 2020-03-08 MED ORDER — PHENOL 1.4 % MT LIQD: 1.0000 | OROMUCOSAL | 0 refills | Status: AC | PRN

## 2020-03-08 MED ORDER — PREDNISONE 20 MG PO TABS: 20.0000 mg | ORAL_TABLET | Freq: Every day | ORAL | 0 refills | Status: AC

## 2020-03-08 MED ORDER — CLONAZEPAM 1 MG PO TABS: 1.0000 mg | ORAL_TABLET | Freq: Three times a day (TID) | ORAL | 0 refills | Status: DC

## 2020-03-08 NOTE — Progress Notes (Signed)
Patient transported to [facility] from 2M09 without any apparent complications.

## 2020-03-08 NOTE — Progress Notes (Signed)
eLink Physician-Brief Progress Note Patient Name: Kathleen Cameron DOB: [birth year] MRN: [id number]   Date of Service  03/08/2020  HPI/Events of Note  Mg+ 1.8, Phosphorus 1.3.  eICU Interventions  Adult electrolyte replacement protocol orders for Mg+ and Phos entered.        [name] [name] 03/08/2020, 4:35 AM

## 2020-03-08 NOTE — Progress Notes (Addendum)
03/08/2020 I saw and evaluated the patient. Discussed with resident and agree with resident's findings and plan as documented in the resident's note.  I have seen and evaluated the patient for ventilator weaning and intermittent shock.  S:  Had another panic attack yesterday requiring PRNs. Heavy secretions, sent for culture Labile blood pressures. She seems in better spirits this am, denies pain.  O: Blood pressure 104/74, pulse 78, temperature 98.8 F (37.1 C), temperature source Oral, resp. rate 18, height 5\' 4"  (1.626 m), weight 57 kg, SpO2 100 %.  No acute distress Trach in place with moderate thick secretions Lungs severely diminished bilaterally, no accessory muscle use Ext warm, 1+ edema  Sputum cx pending Net even, positive for admission 6L Phos/Mg low, repleting CBC benign  A:  Severe COPD with recurrent exacerbations complicated by panic attacks now s/p tracheostomy. Muscular deconditioning Gastroparesis suspect longstanding based on history  P:  Steroid taper, nebs as ordered Work toward PS today Lasix x 1 Indefinite reglan, she cannot tolerate her FRC reductions with gastric bloating F/u sputum aspirate Continue in line PMV trials Continue klonipin with PRN IV benzodiazepines for breakthrough anxiety spells Medically appropriate for LTACH if bed available   Patient critically ill due to respiratory failure Interventions to address this today ventilator weaning, diuresis Risk of deterioration without these interventions is high  I personally spent 31 minutes providing critical care not including any separately billable procedures  MD [facility] 03/08/2020 10:39 AM Personal pager: 03/10/2020 If unanswered, please page CCM On-call: #[phone]   03/08/2020 03/10/2020 MD     NAME:  Suleima Ohlendorf, MRN:  [name], DOB:  16-Jun-1965, LOS: 12 ADMISSION DATE:  02/25/2020, CONSULTATION DATE:  02/25/2020 REFERRING MD:   13/09/2019, MD, CHIEF COMPLAINT:  SOB  Brief History   54yo female with history of COPD presented with shortness of breath, intubated in ED.   History of present illness   54yo female with PMH COPD (no history of smoking with 4 admissions for exacerbation over the last year on supplemental 2L at home, hypertension who presented via EMS in respiratory distress and hypercapnic respiratory failure requiring intubation.   Patient awake off sedation at bedside due to earlier hypotension. She had increased shortness of breath this morning, she denies chest pain. She endorses right abdominal pain, which she states is chronic. She also has chronic constipation with last BM three days ago. She has not noted any blood in her stool.   Discussed with husband over the phone. He states she has a lot of anxiety at baseline and is not sleeping at night, which he thinks led to this. She had sudden SOB this morning and was desatting to 35%, although unclear if perhaps he meant respirations, and he tried to turn up her home supplemental oxygen but she only got progressively worst. He states she has not had recent cough, sick contact, fever that he knows of. She has been using her inhalers. She chronically has shortness of breath and can only walk to and from the bathroom. He states she has had increased abdominal discomfort after eating recently but that this is ongoing.   In the the ER she was intubated with initial propofol leading to drop in blood pressure, which was stopped. She was started on levophed as well and broad spectrum antibiotics.   Past Medical History  COPD Asthma Hypertension   Significant Hospital Events   Admit 11/6  Consults:    Procedures:  ETT 11/6 >>  11/12 Trach 11/12 after failed extubation trial Significant Diagnostic Tests:  CXR 11/6 >> enlarged lung volumes, atelectasis   10/14/2018-pulmonary function test-FVC 1.08 (45% predicted) postbronchodilator ratio 57,  postbronchodilator FEV1 0.56 (29% predicted), no bronchodilator response   11/9 CT head>> No acute intracranial abnormality.  Moderate for age cerebral white matter changes, including chronic involvement of the corpus callosum. This is nonspecific but most commonly due to chronic small vessel disease.  11/9 CT chest/ abd/pelvis>>No acute cardiopulmonary findings. Calcified mediastinal lymph nodes and multiple pleural base nodule. Favor chronic pulmonary findings.  No evidence of bowel obstruction or inflammation. Small amount free fluid the pelvis.   Micro Data:   11/6 Blood culture >> neg 11/6 MRSA>>neg 11/6 Covid and flu>>negative  Antimicrobials:  Vanc 11/6 Cefepime 11/6 Metro 11/6 Azithro 11/6 >>11/10 Ceftriaxone 11/6 >> 11/12  Interim history/subjective:  Alert this morning, feeling well, no abdominal pain or SOB.   Objective   Blood pressure 116/80, pulse 64, temperature 98.5 F (36.9 C), temperature source Oral, resp. rate 18, height 5\' 4"  (1.626 m), weight 57 kg, SpO2 98 %.    Vent Mode: PCV FiO2 (%):  [40 %] 40 % Set Rate:  [14 bmp-18 bmp] 18 bmp Vt Set:  [430 mL] 430 mL PEEP:  [5 cmH20] 5 cmH20 Plateau Pressure:  [21 cmH20-23 cmH20] 21 cmH20   Intake/Output Summary (Last 24 hours) at 03/08/2020 0711 Last data filed at 03/08/2020 0600 Gross per 24 hour  Intake 1910 ml  Output 1630 ml  Net 280 ml   Filed Weights   03/06/20 0335 03/07/20 0417 03/08/20 0346  Weight: 61.2 kg 60.5 kg 57 kg   Constitutional: middle aged woman, NAD, supine in bed Eyes: PERRL, eom intact Ears, nose, mouth, and throat: trach in place, NG tube in place Cardiovascular: regular rate & regular rhythm, ext warm, +1 pitting edema  Respiratory: CTA, on PS Gastrointestinal: distended but soft, hyperactive BS, TTP Skin: No rashes, normal turgor Neurologic: alert, answering questions appropriately with yes/no   Resolved Hospital Problem list   Shock  Assessment & Plan:  Severe COPD  on vent- failed extubation 11/12, after lengthy discussion with husband proceeded with tracheostomy to facilitate ventilator weaning. Baseline anxiety, chronic pain, depression- complicates weaning efforts. Increased agitation yesterday with hypotension and somnolence, now improved.  - Continue rovana/yupelri/pulmicort - cont steroid taper - cont. home lexapro, PO klonipin  - temazepam decreased to 15mg  qhs - fen pushes prn anxiety  - cont. TF - stable for LTACH  Pleural nodules- 3 mo f/u scan (~Feb 2022)  Abd pain, Ileus  -resolved with restarted reglan  - miralax to prn  - cont. Senokot prn - dulcolax ad prn  Electrolyte Derangements - repleting Mg and phos   Best practice:  Diet: TF Pain/Anxiety/Delirium protocol (if indicated): see above  VAP protocol (if indicated): ordered DVT prophylaxis: SCD's GI prophylaxis: pepcid, PPI (PTA) Glucose control: SSI Mobility: OOB Code Status: full Family Communication: daughter updated at bedside 11/17 Disposition: ICU    2023, DO 03/08/2020, 7:11 AM Pager: [phone]

## 2020-03-08 NOTE — Progress Notes (Addendum)
  Speech Language Pathology Treatment: Kathleen Cameron Speaking valve  Patient Details Name: Kathleen Cameron MRN: [id number] DOB: June 25, 1965 Today's Date: 03/08/2020 Time: 2500-3704 SLP Time Calculation (min) (ACUTE ONLY): 16 min  Assessment / Plan / Recommendation Clinical Impression  PMV treatment in conjunction with RT inline with vent. Kathleen Cameron was in chair and able to verbally communicate with spouse at bedside and daughter via Facetime using valve. Pt on pressure control on arrival and RT switched to PS which she did not tolerate and changed back to [facility]. SLP assisted with initiation, intensity and coordination of breaths with phonation. She ran out of air during conversation in long utterances. Once she paused, filled lungs with air and chunked utterances into shorter phrases her intelligibility improved and vocal quality was less hoarse. All vitals were within normal range. When she is able to tolerate trach collar, will be able to wear valve for longer periods and initiate swallow assessments. RT stated she did not tolerate TC this morning.     HPI HPI: 54 y.o. female presented 02/25/20 with shortness of breath and intubated in ED. Trach 11/12; ileus with NG tube 11/13 PMH- COPD on 2 L O2 (4 admissions in past year due to COPD exacerbation), hypertension, depression/anxiety, hld, recent shingles infection      SLP Plan  Continue with current plan of care       Recommendations         Patient may use Passy-Muir Speech Valve: with SLP only PMSV Supervision: Full         Oral Care Recommendations: Oral care BID Follow up Recommendations: Inpatient Rehab SLP Visit Diagnosis: Aphonia (R49.1) Plan: Continue with current plan of care                       [name] 03/08/2020, 1:00 PM  [name] [name].Ed Nurse, children's [phone] Office [phone]

## 2020-03-08 NOTE — Progress Notes (Addendum)
Physical Therapy Treatment Patient Details Name: Kathleen Cameron MRN: [id number] DOB: 08-Sep-1965 Today's Date: 03/08/2020    History of Present Illness 54 y.o. female presented 02/25/20 with shortness of breath and intubated in ED. ETT on 11/6-11/12, failed extubation, put on trach 11/12; ileus with NG tube 11/13 PMH- COPD on 2 L O2 (4 admissions in past year due to COPD exacerbation), hypertension, depression/anxiety, hld, recent shingles infection,    PT Comments    Pt very pleasant, eager to move and progressing with all mobility this session. Pt able to stand and transfer with decreased assist and with RW present able to perform standing marching x 5 with 4 standing trials. Pt eager to be able to move, walk and return home. If pt endurance and strength continues to progress she would be a viable candidate for walking on vent. Will continue to follow and encouraged OOB with nursing staff.   SpO2 95-100% on FiO2 40% PSV   Follow Up Recommendations  CIR;Supervision/Assistance - 24 hour     Equipment Recommendations  Rolling walker with 5" wheels;3in1 (PT)    Recommendations for Other Services       Precautions / Restrictions Precautions Precautions: Fall;Other (comment) Precaution Comments: trach,vent, cortrak, flexiseal    Mobility  Bed Mobility Overal bed mobility: Needs Assistance Bed Mobility: Supine to Sit     Supine to sit: Min assist     General bed mobility comments: very limited assist to lift trunk from surface, pt able to bring legs to EOb on her own. assist for lines  Transfers Overall transfer level: Needs assistance   Transfers: Sit to/from Stand Sit to Stand: Min guard;Min assist Stand pivot transfers: Min assist;+2 safety/equipment       General transfer comment: initial min assist to stand from bed then 5 repeated trials from recliner with minguard, cues for hand placement and increased time to fully rise to standing with Rw present. Min +2 with  bil Ue support to side step along bed and pivot to recliner  Ambulation/Gait                 Stairs             Wheelchair Mobility    Modified Rankin (Stroke Patients Only)       Balance Overall balance assessment: Needs assistance   Sitting balance-Leahy Scale: Fair     Standing balance support: Bilateral upper extremity supported Standing balance-Leahy Scale: Poor Standing balance comment: bil UE support in standing                            Cognition Arousal/Alertness: Awake/alert Behavior During Therapy: WFL for tasks assessed/performed Overall Cognitive Status: Difficult to assess                                        Exercises General Exercises - Lower Extremity Hip Flexion/Marching: AROM;Both;Seated;10 reps;Standing;5 reps (10 reps seated, standing 4 sets of 5 reps)    General Comments        Pertinent Vitals/Pain Pain Assessment: No/denies pain    Home Living                      Prior Function            PT Goals (current goals can now be found in the care plan section) Progress  towards PT goals: Progressing toward goals    Frequency    Min 3X/week      PT Plan Current plan remains appropriate    Co-evaluation              AM-PAC PT "6 Clicks" Mobility   Outcome Measure  Help needed turning from your back to your side while in a flat bed without using bedrails?: A Little Help needed moving from lying on your back to sitting on the side of a flat bed without using bedrails?: A Little Help needed moving to and from a bed to a chair (including a wheelchair)?: A Lot Help needed standing up from a chair using your arms (e.g., wheelchair or bedside chair)?: A Little Help needed to walk in hospital room?: Total Help needed climbing 3-5 steps with a railing? : Total 6 Click Score: 13    End of Session   Activity Tolerance: Patient tolerated treatment well Patient left: in  chair;with call bell/phone within reach;with family/visitor present Nurse Communication: Mobility status PT Visit Diagnosis: Muscle weakness (generalized) (M62.81);Difficulty in walking, not elsewhere classified (R26.2)     Time: 3474-2595 PT Time Calculation (min) (ACUTE ONLY): 31 min  Charges:  $Therapeutic Exercise: 8-22 mins $Therapeutic Activity: 8-22 mins                     Miken Stecher P, PT Acute Rehabilitation Services Pager: [phone] Office: [phone]    Tyger Wichman B Elissa Grieshop 03/08/2020, 10:34 AM

## 2020-03-08 NOTE — Plan of Care (Signed)

## 2020-03-08 NOTE — Progress Notes (Signed)
Report called to Duwayne Heck, RN on [facility]. [place], [place] with SWAT transported patient from 2M09 to new room at [facility]. Vital signs stable prior to transport.

## 2020-03-08 NOTE — Progress Notes (Signed)
Nutrition Follow-up  DOCUMENTATION CODES:   Non-severe (moderate) malnutrition in context of chronic illness  INTERVENTION:   Continue tube feeding via Cortrak: - Change to Osmolite 1.2 @ 60 ml/hr (1440 ml/day)  Tube feeding regimen provides 1728 kcal, 80 grams of protein, and 1181 ml of H2O.   NUTRITION DIAGNOSIS:   Moderate Malnutrition related to chronic illness (COPD) as evidenced by mild fat depletion, moderate muscle depletion, severe muscle depletion.  Ongoing  GOAL:   Patient will meet greater than or equal to 90% of their needs  Met via TF  MONITOR:   Vent status, Labs, Weight trends, TF tolerance, I & O's  REASON FOR ASSESSMENT:   Consult Enteral/tube feeding initiation and management  ASSESSMENT:   54 year old female who presented to the ED on 11/06 with respiratory distress. PMH of COPD, HTN, asthma. Pt required intubation in the ED.   11/12 - extubated to BiPAP, reintubated, s/p trach 11/13 - NGT placed to low intermittent suction 11/15 - Cortrak placed, tip gastric per x-ray  Discussed pt with RN and during ICU rounds. Pt tolerating current TF without difficulty. Will change to fiber-free formula.  Spoke with pt and husband in room. Pt in recliner at time of visit and denies any abdominal pain or nausea. Discussed TF with pt and husband who do not have any questions at this time.  EDW: 51 kg (admit weight)  Current TF: Vital AF 1.2 @ 50 ml/hr  Patient remains on ventilator support via trach MV: 5.2 L/min Temp (24hrs), Avg:98.4 F (36.9 C), Min:97.8 F (36.6 C), Max:99.6 F (37.6 C)  Medications reviewed and include: pepcid, solu-medrol, reglan 10 mg q 8 hours, protonix, phos-nak 1 packet x 2 doses  Labs reviewed: BUN 25, phosphorus 1.3, hemoglobin 7.9 CBG's: 79-164 x 24 hours  UOP: 1450 ml x 24 hours Rectal tube: 180 ml + 1 unmeasured occurrence x 24 hours I/O's: +6.3 L since admit  Diet Order:   Diet Order            Diet NPO time  specified  Diet effective now                 EDUCATION NEEDS:   No education needs have been identified at this time  Skin:  Skin Assessment: Skin Integrity Issues: Stage II: nose  Last BM:  03/08/20 rectal tube  Height:   Ht Readings from Last 1 Encounters:  03/07/20 $RemoveB'5\' 4"'PPmVkAzM$  (1.626 m)    Weight:   Wt Readings from Last 1 Encounters:  03/08/20 57 kg    Ideal Body Weight:  54.5 kg  BMI:  Body mass index is 21.57 kg/m.  Estimated Nutritional Needs:   Kcal:  1600-1800  Protein:  75-90 grams  Fluid:  >/= 1.6 L    Gustavus Bryant, MS, RD, LDN Inpatient Clinical Dietitian Please see AMiON for contact information.

## 2020-03-08 NOTE — Discharge Summary (Signed)
Physician Discharge Summary  Patient ID: Kathleen Cameron MRN: [id number] DOB/AGE: 07-20-1965 54 y.o.  Admit date: 02/25/2020 Discharge date: 03/08/2020  Admission Diagnoses: Acute on chronic hypoxic and hypercarbic respiratory failure secondary to COPD exacerbation   Discharge Diagnoses:  Active Problems:   Acute hypercapnic respiratory failure (HCC)   Hypotension   Malnutrition of moderate degree   Generalized abdominal pain   Convulsions ([place])   Discharged Condition: Stable  Hospital Course:  This is a 54 year old woman with a history of advanced COPD and chronic depression/anxiety, panic attacks who presented with acute on chronic hypoxic and hypercarbic respiratory failure requiring intubation.  Multiple attempts were made to wean her from the ventilator which she ultimately failed.  She underwent tracheostomy on 03/02/2020.  We have been working on weaning her off of the ventilator since.  Remaining issues are as follows:  Intermittent panic attacks: She has been started on standing Klonopin and has as needed IV benzodiazepines for breakthrough attacks.  When these attacks occur, she becomes tachypneic air traps and desaturates.  Gastroparesis: This was a problem prior to admission.  She does very well with standing Reglan to manage this issue.  She is tolerating tube feeds to a core track and is having bowel movements.  Presumed COPD flare: This is improving, again the languishing I think is more related to her anxiety disorder rather than continued bronchospasm.  She has been managed on nebulizers and is on a steroid taper.  Muscular deconditioning related to recurrent COPD admissions: Physical therapy has been working with her and she has been getting up to the chair daily.  Secretions: A sputum culture has been sent.  She is afebrile.  No antibiotics have been started at this time.  Question of volume overload: We have been giving her intermittent diuretics to offset IV  fluids given early remission.  Dysphagia related to tracheostomy tube: She currently has a core track in place.  Once she has been weaned from the ventilator, swallowing studies can begin.  Pleural nodules: would get CT chest in 6 months to assure stability   Discharge Exam: Blood pressure 128/90, pulse 65, temperature 97.9 F (36.6 C), temperature source Oral, resp. rate 18, height $RemoveBe'5\' 4"'wAPgwWXGB$  (1.626 m), weight 57 kg, SpO2 99 %. No acute distress Trach in place with moderate thick secretions Lungs severely diminished bilaterally, no accessory muscle use Ext warm, 1+ edema   Disposition: Discharge disposition: 02-Transferred to [facility]        Allergies as of 03/08/2020      Reactions   Stiolto Respimat [tiotropium Bromide-olodaterol] Other (See Comments)   Severe headaches and rapid heartrate      Medication List    STOP taking these medications   acetaminophen 500 MG tablet Commonly known as: TYLENOL   albuterol 108 (90 Base) MCG/ACT inhaler Commonly known as: VENTOLIN HFA   ALPRAZolam 0.25 MG tablet Commonly known as: XANAX   alum & mag hydroxide-simeth 528-413-24 MG/5ML suspension Commonly known as: MAALOX/MYLANTA   aspirin 81 MG EC tablet Replaced by: aspirin 81 MG chewable tablet   bisoprolol 10 MG tablet Commonly known as: ZEBETA   Breztri Aerosphere 160-9-4.8 MCG/ACT Aero Generic drug: Budeson-Glycopyrrol-Formoterol   escitalopram 20 MG tablet Commonly known as: Lexapro   famotidine 10 MG tablet Commonly known as: PEPCID Replaced by: famotidine 40 MG/5ML suspension   HYDROcodone-acetaminophen 5-325 MG tablet Commonly known as: Norco   ipratropium-albuterol 0.5-2.5 (3) MG/3ML Soln Commonly known as: DUONEB   Mucinex DM Maximum Strength 60-1200  MG Tb12   nitroGLYCERIN 0.4 MG SL tablet Commonly known as: NITROSTAT   OXYGEN   pantoprazole 40 MG tablet Commonly known as: PROTONIX Replaced by: pantoprazole sodium 40 mg/20 mL Pack    valACYclovir 1000 MG tablet Commonly known as: VALTREX     TAKE these medications   amLODipine 5 MG tablet Commonly known as: NORVASC Place 2 tablets (10 mg total) into feeding tube daily. Start taking on: March 09, 2020 What changed:   how much to take  how to take this   arformoterol 15 MCG/2ML Nebu Commonly known as: BROVANA Take 2 mLs (15 mcg total) by nebulization 2 (two) times daily.   aspirin 81 MG chewable tablet Place 1 tablet (81 mg total) into feeding tube daily. Start taking on: March 09, 2020 Replaces: aspirin 81 MG EC tablet   bisacodyl 10 MG suppository Commonly known as: DULCOLAX Place 1 suppository (10 mg total) rectally daily as needed for severe constipation.   budesonide 0.25 MG/2ML nebulizer solution Commonly known as: PULMICORT Take 2 mLs (0.25 mg total) by nebulization 2 (two) times daily.   chlorhexidine gluconate (MEDLINE KIT) 0.12 % solution Commonly known as: PERIDEX 15 mLs by Mouth Rinse route 2 (two) times daily.   Chlorhexidine Gluconate Cloth 2 % Pads Apply 6 each topically daily. Start taking on: March 09, 2020   clonazePAM 1 MG tablet Commonly known as: KLONOPIN Place 1 tablet (1 mg total) into feeding tube 3 (three) times daily.   enoxaparin 40 MG/0.4ML injection Commonly known as: LOVENOX Inject 0.4 mLs (40 mg total) into the skin daily. Start taking on: March 09, 2020   famotidine 40 MG/5ML suspension Commonly known as: PEPCID Place 2.5 mLs (20 mg total) into feeding tube 2 (two) times daily. Replaces: famotidine 10 MG tablet   feeding supplement (OSMOLITE 1.2 CAL) Liqd Place 1,000 mLs into feeding tube continuous.   fentaNYL 100 MCG/2ML injection Commonly known as: SUBLIMAZE Inject 0.5 mLs (25 mcg total) into the vein every 2 (two) hours as needed for severe pain.   levothyroxine 50 MCG tablet Commonly known as: SYNTHROID Take 1 tablet (50 mcg total) by mouth daily at 6 (six) AM.   lip balm  ointment Apply topically as needed for lip care.   metoCLOPramide 10 MG tablet Commonly known as: REGLAN Place 1 tablet (10 mg total) into feeding tube every 8 (eight) hours.   midazolam 2 MG/2ML Soln injection Commonly known as: VERSED Inject 2 mLs (2 mg total) into the vein every 2 (two) hours as needed for sedation.   pantoprazole sodium 40 mg/20 mL Pack Commonly known as: PROTONIX Place 20 mLs (40 mg total) into feeding tube at bedtime. Replaces: pantoprazole 40 MG tablet   phenol 1.4 % Liqd Commonly known as: CHLORASEPTIC Use as directed 1 spray in the mouth or throat as needed for throat irritation / pain.   Phos-NaK 280-160-250 MG Pack Generic drug: potassium & sodium phosphates Place 1 packet into feeding tube every 8 (eight) hours for 2 doses.   polyethylene glycol 17 g packet Commonly known as: MIRALAX / GLYCOLAX Place 17 g into feeding tube daily as needed for mild constipation. What changed:   how to take this  reasons to take this   prednisoLONE acetate 1 % ophthalmic suspension Commonly known as: PRED FORTE Place 1 drop into the left eye 4 (four) times daily.   predniSONE 20 MG tablet Commonly known as: DELTASONE Place 1 tablet (20 mg total) into feeding tube daily with  breakfast for 7 days. Start taking on: March 10, 2020   revefenacin 175 MCG/3ML nebulizer solution Commonly known as: YUPELRI Take 3 mLs (175 mcg total) by nebulization daily. Start taking on: March 09, 2020   rosuvastatin 40 MG tablet Commonly known as: CRESTOR Place 1 tablet (40 mg total) into feeding tube daily. What changed: how to take this   senna-docusate 8.6-50 MG tablet Commonly known as: Senokot-S Place 2 tablets into feeding tube at bedtime as needed for moderate constipation.   sodium chloride 0.65 % Soln nasal spray Commonly known as: OCEAN Place 1 spray into both nostrils as needed for congestion.   temazepam 15 MG capsule Commonly known as: RESTORIL Place  1 capsule (15 mg total) into feeding tube at bedtime.   venlafaxine 37.5 MG tablet Commonly known as: EFFEXOR Place 1 tablet (37.5 mg total) into feeding tube 2 (two) times daily with a meal.        Signed: Candee Furbish 03/08/2020, 1:50 PM

## 2020-03-09 ENCOUNTER — Encounter: Payer: Self-pay | Admitting: Internal Medicine

## 2020-03-09 DIAGNOSIS — F419 Anxiety disorder, unspecified: Secondary | ICD-10-CM | POA: Diagnosis present

## 2020-03-09 DIAGNOSIS — J9621 Acute and chronic respiratory failure with hypoxia: Secondary | ICD-10-CM

## 2020-03-09 DIAGNOSIS — J9600 Acute respiratory failure, unspecified whether with hypoxia or hypercapnia: Secondary | ICD-10-CM | POA: Diagnosis present

## 2020-03-09 DIAGNOSIS — G40909 Epilepsy, unspecified, not intractable, without status epilepticus: Secondary | ICD-10-CM

## 2020-03-09 DIAGNOSIS — J449 Chronic obstructive pulmonary disease, unspecified: Secondary | ICD-10-CM | POA: Diagnosis present

## 2020-03-09 LAB — COMPREHENSIVE METABOLIC PANEL
ALT: 33 U/L (ref 0–44)
AST: 42 U/L — ABNORMAL HIGH (ref 15–41)
Albumin: 2.6 g/dL — ABNORMAL LOW (ref 3.5–5.0)
Alkaline Phosphatase: 37 U/L — ABNORMAL LOW (ref 38–126)
Anion gap: 4 — ABNORMAL LOW (ref 5–15)
BUN: 22 mg/dL — ABNORMAL HIGH (ref 6–20)
CO2: 41 mmol/L — ABNORMAL HIGH (ref 22–32)
Calcium: 8.8 mg/dL — ABNORMAL LOW (ref 8.9–10.3)
Chloride: 93 mmol/L — ABNORMAL LOW (ref 98–111)
Creatinine, Ser: 0.62 mg/dL (ref 0.44–1.00)
GFR, Estimated: >60 mL/min (ref >60)
Glucose, Bld: 128 mg/dL — ABNORMAL HIGH (ref 70–99)
Potassium: 3.4 mmol/L — ABNORMAL LOW (ref 3.5–5.1)
Sodium: 138 mmol/L (ref 135–145)
Total Bilirubin: 0.5 mg/dL (ref 0.3–1.2)
Total Protein: 5.3 g/dL — ABNORMAL LOW (ref 6.5–8.1)

## 2020-03-09 LAB — CBC
HCT: 24.9 % — ABNORMAL LOW (ref 36.0–46.0)
Hemoglobin: 7.5 g/dL — ABNORMAL LOW (ref 12.0–15.0)
MCH: 28.8 pg (ref 26.0–34.0)
MCHC: 30.1 g/dL (ref 30.0–36.0)
MCV: 95.8 fL (ref 80.0–100.0)
Platelets: 214 10*3/uL (ref 150–400)
RBC: 2.6 MIL/uL — ABNORMAL LOW (ref 3.87–5.11)
RDW: 15.9 % — ABNORMAL HIGH (ref 11.5–15.5)
WBC: 7.9 10*3/uL (ref 4.0–10.5)
nRBC: 0 % (ref 0.0–0.2)

## 2020-03-09 NOTE — Consult Note (Signed)
Pulmonary Critical Care Medicine [facility] [place]  PULMONARY SERVICE  Date of Service: 03/09/2020  PULMONARY CRITICAL CARE [name]  QVZ:[id number]  DOB: July 21, 1965   DOA: 03/08/2020  Referring Physician: Carron Curie, MD  HPI: [name] is a 54 y.o. female seen for follow up of Acute on Chronic Respiratory Failure. Patient has multiple medical problems including COPD depression anxiety panic attacks respiratory failure who presents to the hospital because of acute respiratory failure.  Patient ended up intubated on the ventilator despite multiple attempts at weaning subsequently patient was not able to come off the ventilator and therefore had a tracheostomy done.  Other issues included gastroparesis panic attacks and COPD the primary cause of admission.  Patient after tracheostomy was sent to [place] for further assessment and weaning  Review of Systems:  ROS performed and is unremarkable other than noted above.  Past Medical History:  Diagnosis Date  . Asthma   . COPD (chronic obstructive pulmonary disease) (HCC)   . Hypertension     Past Surgical History:  Procedure Laterality Date  . WISDOM TOOTH EXTRACTION      Social History:    reports that she has never smoked. She has never used smokeless tobacco. She reports current alcohol use of about 2.0 standard drinks of alcohol per week. She reports that she does not use drugs.  Family History: Non-Contributory to the present illness  Allergies  Allergen Reactions  . Stiolto Respimat [Tiotropium Bromide-Olodaterol] Other (See Comments)    Severe headaches and rapid heartrate    Medications: Reviewed on Rounds  Physical Exam:  Vitals: Temperature 96.7 pulse 59 respiratory 18 blood pressure is 144/91 saturations 98%  Ventilator Settings on pressure assist control FiO2 30% IP 25 PEEP 5  . General: Comfortable at this time . Eyes: Grossly normal lids, irises & conjunctiva . ENT:  grossly tongue is normal . Neck: no obvious mass . Cardiovascular: S1-S2 normal no gallop or rub . Respiratory: No rhonchi coarse breath sounds . Abdomen: Soft and nontender . Skin: no rash seen on limited exam . Musculoskeletal: not rigid . Psychiatric:unable to assess . Neurologic: no seizure no involuntary movements         Labs on Admission:  Basic Metabolic Panel: Recent Labs  Lab 03/04/20 0841 03/04/20 1710 03/05/20 0409 03/05/20 0409 03/06/20 0416 03/07/20 0150 03/07/20 1340 03/08/20 0234 03/09/20 0336  NA   < >  --  139   < > 139 143 139 140 138  K   < >  --  4.0   < > 4.9 3.4* 4.4 4.3 3.4*  CL   < >  --  104  --  97* 98  --  97* 93*  CO2   < >  --  27  --  32 38*  --  36* 41*  GLUCOSE   < >  --  110*  --  98 94  --  111* 128*  BUN   < >  --  31*  --  25* 15  --  25* 22*  CREATININE   < >  --  0.71  --  0.57 0.59  --  0.71 0.62  CALCIUM   < >  --  9.1  --  8.9 9.0  --  8.7* 8.8*  MG  --  2.3 2.3  --  2.2 2.0  --  1.8  --   PHOS  --  3.3 2.8  --  3.4 2.6  --  1.3*  --    < > = values in this interval not displayed.    Recent Labs  Lab 03/02/20 1312 03/02/20 1552 03/07/20 1340 03/08/20 1848  PHART 7.081* 7.416 7.545* 7.503*  PCO2ART 107* 55.1* 50.0* 52.8*  PO2ART 191* 538* 143* 114*  HCO3 30.4* 35.4* 43.3* 41.5*  O2SAT 98.2 100.0 99.0 99.0    Liver Function Tests: Recent Labs  Lab 03/03/20 0500 03/07/20 1432 03/09/20 0336  AST 18 21 42*  ALT 20 22 33  ALKPHOS 42 41 37*  BILITOT 0.8 0.5 0.5  PROT 5.9* 5.8* 5.3*  ALBUMIN 3.0* 2.8* 2.6*   No results for input(s): LIPASE, AMYLASE in the last 168 hours. No results for input(s): AMMONIA in the last 168 hours.  CBC: Recent Labs  Lab 03/05/20 0409 03/05/20 0409 03/06/20 0735 03/07/20 0150 03/07/20 1340 03/08/20 0234 03/09/20 0336  WBC 5.8  --  13.3* 9.0  --  8.4 7.9  HGB 7.5*   < > 9.6* 9.6* 11.9* 7.9* 7.5*  HCT 24.9*   < > 32.3* 32.0* 35.0* 25.7* 24.9*  MCV 96.5  --  98.5 97.6  --  95.5  95.8  PLT 228  --  313 261  --  189 214   < > = values in this interval not displayed.    Cardiac Enzymes: No results for input(s): CKTOTAL, CKMB, CKMBINDEX, TROPONINI in the last 168 hours.  BNP (last 3 results) Recent Labs    09/06/19 1414 01/28/20 1503 02/25/20 1115  BNP 32.2 106.8* 282.7*    ProBNP (last 3 results) No results for input(s): PROBNP in the last 8760 hours.   Radiological Exams on Admission: DG Chest 1 View  Result Date: 03/07/2020 CLINICAL DATA:  Hypoxia EXAM: CHEST  1 VIEW COMPARISON:  March 07, 2020 study obtained earlier in the day FINDINGS: Tracheostomy catheter tip is 6.0 cm above the carina. Feeding tube tip is below the diaphragm. No pneumothorax. There is atelectatic change in the right base. There is ill-defined opacity in the right base as well, stable. Left lung is clear. Heart size and pulmonary vascularity are normal. No adenopathy. No bone lesions. IMPRESSION: Ill-defined patchy opacity right base, likely pneumonia. Associated atelectasis in this area. Left lung clear. Heart size normal. Tube positions as described without pneumothorax. Electronically Signed   By: Bretta Bang III M.D.   On: 03/07/2020 13:40   DG Chest 1 View  Result Date: 03/07/2020 CLINICAL DATA:  Tracheostomy EXAM: CHEST  1 VIEW COMPARISON:  03/02/2020 FINDINGS: Tracheostomy is unchanged. Nasoenteric feeding tube with its tip overlying the proximal body of the stomach is noted. Pulmonary insufflation has improved and is now normal and symmetric. There are scattered nodular infiltrates noted within the lung bases bilaterally and asymmetrically within the mid lung zones bilaterally which are nonspecific, but may be infectious or inflammatory in nature. No pneumothorax or pleural effusion. Cardiac size within normal limits. No acute bone abnormality. IMPRESSION: Tracheostomy in expected position. Nasoenteric feeding tube tip within the proximal stomach. Improved pulmonary volumes.  Development of nodular scattered infiltrates, possibly infectious or inflammatory. Electronically Signed   By: Helyn Numbers MD   On: 03/07/2020 05:59   DG Abd 1 View  Result Date: 03/07/2020 CLINICAL DATA:  Abdominal distension and abnormal bowel sounds. EXAM: ABDOMEN - 1 VIEW COMPARISON:  Multiple prior studies most recent comparison chest x-ray from the same date and abdominal radiograph from March 05, 2020 FINDINGS: Feeding tube, tip in the mid stomach. Moderate to marked gastric  distension. Potential placement of rectal management tube as well. Perhaps slightly less distension of the colon compared to previous studies. Diffuse small bowel distension as well, distended with gas. Lung bases are clear. No acute skeletal process on limited assessment. IMPRESSION: 1. Feeding tube tip in the mid stomach. 2. Moderate to marked gastric distension. 3. Persistent diffuse gaseous distension of both large and small bowel may reflect global ileus. Perhaps slightly less distension of the colon when compared to the limited abdominal radiograph from the 15th of November. 4. Potential placement of rectal management tube projecting over the pelvis. Electronically Signed   By: Donzetta Kohut M.D.   On: 03/07/2020 12:06   DG Chest Port 1 View  Result Date: 03/08/2020 CLINICAL DATA:  Respiratory failure EXAM: PORTABLE CHEST 1 VIEW COMPARISON:  March 07, 2020 FINDINGS: Tracheostomy catheter tip is 7.4 cm above the carina. Feeding tube tip in stomach. No pneumothorax. There there is mild right upper lobe and left lower lobe atelectasis. No edema or airspace opacity. Heart size and pulmonary vascular normal. Aorta is mildly tortuous, stable. No bone lesions. IMPRESSION: Tube positions as described without pneumothorax. Areas of scattered atelectatic change. No edema or airspace opacity. Stable cardiac silhouette. Electronically Signed   By: Bretta Bang III M.D.   On: 03/08/2020 19:35   DG Abd Portable  1V  Result Date: 03/08/2020 CLINICAL DATA:  Enteric tube placement EXAM: PORTABLE ABDOMEN - 1 VIEW COMPARISON:  None. FINDINGS: Enteric tube tip is in the mid body of the stomach. There is evidence of a hiatal hernia. There is no bowel dilatation or air-fluid level to suggest bowel obstruction. No free air. Lung bases clear. IMPRESSION: Enteric tube tip in proximal stomach. Hiatal hernia evident. No bowel obstruction or free air appreciable. Electronically Signed   By: Bretta Bang III M.D.   On: 03/08/2020 19:36   DG Abd Portable 1V  Result Date: 03/05/2020 CLINICAL DATA:  Feeding tube placement. EXAM: PORTABLE ABDOMEN - 1 VIEW COMPARISON:  03/03/2020 FINDINGS: A new feeding tube is seen with tip overlying the gastric fundus. Dilated small bowel loops and transverse colon again noted in the visualized upper abdomen, similar to prior study. IMPRESSION: New feeding tube, with tip overlying the gastric fundus. Electronically Signed   By: Danae Orleans M.D.   On: 03/05/2020 12:38    Assessment/Plan Active Problems:   Acute on chronic respiratory failure with hypoxia (HCC)   Seizure disorder (HCC)   COPD, severe (HCC)   Anxiety disorder   1. Acute on chronic respiratory failure hypoxia patient continues on pressure control mode right now been on 30% FiO2 respiratory therapy will assess the RSB I mechanics and try to wean the patient. 2. Seizure disorder no active seizure noted at this time we will continue to monitor closely. 3. Severe COPD with exacerbation now is back to baseline we will continue with nebulizers and medical management. 4. Anxiety disorder panic attacks patient has been on clonazepam management Xanax plan is to adjust her medications accordingly.  I have personally seen and evaluated the patient, evaluated laboratory and imaging results, formulated the assessment and plan and placed orders. The Patient requires high complexity decision making with multiple systems  involvement.  Case was discussed on Rounds with the Respiratory Therapy Director and the Respiratory staff Time Spent  Yevonne Pax, MD [facility] Pulmonary Critical Care Medicine Sleep Medicine

## 2020-03-10 LAB — CULTURE, RESPIRATORY W GRAM STAIN: Culture: NORMAL

## 2020-03-10 NOTE — Progress Notes (Signed)
Pulmonary Critical Care Medicine [facility] [place]   PULMONARY CRITICAL CARE SERVICE  PROGRESS NOTE  Date of Service: 03/10/2020  [name]  SRP:[id number]  DOB: 1966/01/12   DOA: 03/08/2020  Referring Physician: Carron Curie, MD  HPI: Kathleen Cameron is a 54 y.o. female seen for follow up of Acute on Chronic Respiratory Failure.  This morning not tolerating weaning remains on full support and pressure control  Medications: Reviewed on Rounds  Physical Exam:  Vitals: Temperature 97.0 pulse 58 respiratory rate 17 blood pressure is 133/83 saturations 99%  Ventilator Settings on pressure control FiO2 30% IP 25 PEEP 5  . General: Comfortable at this time . Eyes: Grossly normal lids, irises & conjunctiva . ENT: grossly tongue is normal . Neck: no obvious mass . Cardiovascular: S1 S2 normal no gallop . Respiratory: No rhonchi no rales noted at this time . Abdomen: soft . Skin: no rash seen on limited exam . Musculoskeletal: not rigid . Psychiatric:unable to assess . Neurologic: no seizure no involuntary movements         Lab Data:   Basic Metabolic Panel: Recent Labs  Lab 03/04/20 0841 03/04/20 1710 03/05/20 0409 03/05/20 0409 03/06/20 0416 03/07/20 0150 03/07/20 1340 03/08/20 0234 03/09/20 0336  NA   < >  --  139   < > 139 143 139 140 138  K   < >  --  4.0   < > 4.9 3.4* 4.4 4.3 3.4*  CL   < >  --  104  --  97* 98  --  97* 93*  CO2   < >  --  27  --  32 38*  --  36* 41*  GLUCOSE   < >  --  110*  --  98 94  --  111* 128*  BUN   < >  --  31*  --  25* 15  --  25* 22*  CREATININE   < >  --  0.71  --  0.57 0.59  --  0.71 0.62  CALCIUM   < >  --  9.1  --  8.9 9.0  --  8.7* 8.8*  MG  --  2.3 2.3  --  2.2 2.0  --  1.8  --   PHOS  --  3.3 2.8  --  3.4 2.6  --  1.3*  --    < > = values in this interval not displayed.    ABG: Recent Labs  Lab 03/07/20 1340 03/08/20 1848  PHART 7.545* 7.503*  PCO2ART 50.0* 52.8*  PO2ART 143* 114*  HCO3  43.3* 41.5*  O2SAT 99.0 99.0    Liver Function Tests: Recent Labs  Lab 03/07/20 1432 03/09/20 0336  AST 21 42*  ALT 22 33  ALKPHOS 41 37*  BILITOT 0.5 0.5  PROT 5.8* 5.3*  ALBUMIN 2.8* 2.6*   No results for input(s): LIPASE, AMYLASE in the last 168 hours. No results for input(s): AMMONIA in the last 168 hours.  CBC: Recent Labs  Lab 03/05/20 0409 03/05/20 0409 03/06/20 0735 03/07/20 0150 03/07/20 1340 03/08/20 0234 03/09/20 0336  WBC 5.8  --  13.3* 9.0  --  8.4 7.9  HGB 7.5*   < > 9.6* 9.6* 11.9* 7.9* 7.5*  HCT 24.9*   < > 32.3* 32.0* 35.0* 25.7* 24.9*  MCV 96.5  --  98.5 97.6  --  95.5 95.8  PLT 228  --  313 261  --  189 214   < > =  values in this interval not displayed.    Cardiac Enzymes: No results for input(s): CKTOTAL, CKMB, CKMBINDEX, TROPONINI in the last 168 hours.  BNP (last 3 results) Recent Labs    09/06/19 1414 01/28/20 1503 02/25/20 1115  BNP 32.2 106.8* 282.7*    ProBNP (last 3 results) No results for input(s): PROBNP in the last 8760 hours.  Radiological Exams: DG Chest Port 1 View  Result Date: 03/08/2020 CLINICAL DATA:  Respiratory failure EXAM: PORTABLE CHEST 1 VIEW COMPARISON:  March 07, 2020 FINDINGS: Tracheostomy catheter tip is 7.4 cm above the carina. Feeding tube tip in stomach. No pneumothorax. There there is mild right upper lobe and left lower lobe atelectasis. No edema or airspace opacity. Heart size and pulmonary vascular normal. Aorta is mildly tortuous, stable. No bone lesions. IMPRESSION: Tube positions as described without pneumothorax. Areas of scattered atelectatic change. No edema or airspace opacity. Stable cardiac silhouette. Electronically Signed   By: Bretta Bang III M.D.   On: 03/08/2020 19:35   DG Abd Portable 1V  Result Date: 03/08/2020 CLINICAL DATA:  Enteric tube placement EXAM: PORTABLE ABDOMEN - 1 VIEW COMPARISON:  None. FINDINGS: Enteric tube tip is in the mid body of the stomach. There is evidence  of a hiatal hernia. There is no bowel dilatation or air-fluid level to suggest bowel obstruction. No free air. Lung bases clear. IMPRESSION: Enteric tube tip in proximal stomach. Hiatal hernia evident. No bowel obstruction or free air appreciable. Electronically Signed   By: Bretta Bang III M.D.   On: 03/08/2020 19:36    Assessment/Plan Active Problems:   Acute on chronic respiratory failure with hypoxia (HCC)   Seizure disorder (HCC)   COPD, severe (HCC)   Anxiety disorder   1. Acute on chronic respiratory failure hypoxia remains on the ventilator and full support respiratory therapy will assess the RSB I mechanics. 2. Seizure disorder no active seizure noted at this time. 3. Severe COPD at baseline we will continue to monitor 4. Anxiety disorder supportive care   I have personally seen and evaluated the patient, evaluated laboratory and imaging results, formulated the assessment and plan and placed orders. The Patient requires high complexity decision making with multiple systems involvement.  Rounds were done with the Respiratory Therapy Director and Staff therapists and discussed with nursing staff also.  Yevonne Pax, MD [facility] Pulmonary Critical Care Medicine Sleep Medicine

## 2020-03-11 NOTE — Progress Notes (Signed)
Pulmonary Critical Care Medicine [facility] [place]   PULMONARY CRITICAL CARE SERVICE  PROGRESS NOTE  Date of Service: 03/11/2020  Chessie Neuharth  TML:[id number]  DOB: 28-Jul-1965   DOA: 03/08/2020  Referring Physician: Carron Curie, MD  HPI: Kathleen Cameron is a 54 y.o. female seen for follow up of Acute on Chronic Respiratory Failure.  Patient currently is on pressure support goal is for 4 hours  Medications: Reviewed on Rounds  Physical Exam:  Vitals: Temperature is 97.2 pulse 71 respiratory rate 18 blood pressure is 156/87 saturations 97%  Ventilator Settings on pressure support FiO2 30% pressure 12/5  . General: Comfortable at this time . Eyes: Grossly normal lids, irises & conjunctiva . ENT: grossly tongue is normal . Neck: no obvious mass . Cardiovascular: S1 S2 normal no gallop . Respiratory: No rhonchi no rales noted at this time . Abdomen: soft . Skin: no rash seen on limited exam . Musculoskeletal: not rigid . Psychiatric:unable to assess . Neurologic: no seizure no involuntary movements         Lab Data:   Basic Metabolic Panel: Recent Labs  Lab 03/04/20 1710 03/05/20 0409 03/05/20 0409 03/06/20 0416 03/07/20 0150 03/07/20 1340 03/08/20 0234 03/09/20 0336  NA  --  139   < > 139 143 139 140 138  K  --  4.0   < > 4.9 3.4* 4.4 4.3 3.4*  CL  --  104  --  97* 98  --  97* 93*  CO2  --  27  --  32 38*  --  36* 41*  GLUCOSE  --  110*  --  98 94  --  111* 128*  BUN  --  31*  --  25* 15  --  25* 22*  CREATININE  --  0.71  --  0.57 0.59  --  0.71 0.62  CALCIUM  --  9.1  --  8.9 9.0  --  8.7* 8.8*  MG 2.3 2.3  --  2.2 2.0  --  1.8  --   PHOS 3.3 2.8  --  3.4 2.6  --  1.3*  --    < > = values in this interval not displayed.    ABG: Recent Labs  Lab 03/07/20 1340 03/08/20 1848  PHART 7.545* 7.503*  PCO2ART 50.0* 52.8*  PO2ART 143* 114*  HCO3 43.3* 41.5*  O2SAT 99.0 99.0    Liver Function Tests: Recent Labs  Lab  03/07/20 1432 03/09/20 0336  AST 21 42*  ALT 22 33  ALKPHOS 41 37*  BILITOT 0.5 0.5  PROT 5.8* 5.3*  ALBUMIN 2.8* 2.6*   No results for input(s): LIPASE, AMYLASE in the last 168 hours. No results for input(s): AMMONIA in the last 168 hours.  CBC: Recent Labs  Lab 03/05/20 0409 03/05/20 0409 03/06/20 0735 03/07/20 0150 03/07/20 1340 03/08/20 0234 03/09/20 0336  WBC 5.8  --  13.3* 9.0  --  8.4 7.9  HGB 7.5*   < > 9.6* 9.6* 11.9* 7.9* 7.5*  HCT 24.9*   < > 32.3* 32.0* 35.0* 25.7* 24.9*  MCV 96.5  --  98.5 97.6  --  95.5 95.8  PLT 228  --  313 261  --  189 214   < > = values in this interval not displayed.    Cardiac Enzymes: No results for input(s): CKTOTAL, CKMB, CKMBINDEX, TROPONINI in the last 168 hours.  BNP (last 3 results) Recent Labs    09/06/19 1414 01/28/20 1503 02/25/20 1115  BNP 32.2 106.8* 282.7*    ProBNP (last 3 results) No results for input(s): PROBNP in the last 8760 hours.  Radiological Exams: No results found.  Assessment/Plan Active Problems:   Acute on chronic respiratory failure with hypoxia (HCC)   Seizure disorder (HCC)   COPD, severe (HCC)   Anxiety disorder   1. Acute on chronic respiratory failure with hypoxia we will continue with pressure support goal 4 hours 2. Seizure disorder no active seizures noted 3. Severe COPD at baseline 4. Anxiety disorder no change we will continue to monitor   I have personally seen and evaluated the patient, evaluated laboratory and imaging results, formulated the assessment and plan and placed orders. The Patient requires high complexity decision making with multiple systems involvement.  Rounds were done with the Respiratory Therapy Director and Staff therapists and discussed with nursing staff also.  Yevonne Pax, MD [facility] Pulmonary Critical Care Medicine Sleep Medicine

## 2020-03-12 NOTE — Progress Notes (Signed)
Pulmonary Critical Care Medicine [facility] [place]   PULMONARY CRITICAL CARE SERVICE  PROGRESS NOTE  Date of Service: 03/12/2020  Kathleen Cameron  ZOX:[id number]  DOB: [birth year]   DOA: 03/08/2020  Referring Physician: Carron Curie, MD  HPI: Kathleen Cameron is a 55 y.o. female seen for follow up of Acute on Chronic Respiratory Failure.  Patient is doing pressure support 12/5 his goal is for 12 hours  Medications: Reviewed on Rounds  Physical Exam:  Vitals: Temperature is 97.5 pulse 70 respiratory rate 18 blood pressure is 167/95 saturations 98%  Ventilator Settings pressure support FiO2 is 35%  . General: Comfortable at this time . Eyes: Grossly normal lids, irises & conjunctiva . ENT: grossly tongue is normal . Neck: no obvious mass . Cardiovascular: S1 S2 normal no gallop . Respiratory: No rhonchi no rales . Abdomen: soft . Skin: no rash seen on limited exam . Musculoskeletal: not rigid . Psychiatric:unable to assess . Neurologic: no seizure no involuntary movements         Lab Data:   Basic Metabolic Panel: Recent Labs  Lab 03/06/20 0416 03/07/20 0150 03/07/20 1340 03/08/20 0234 03/09/20 0336  NA 139 143 139 140 138  K 4.9 3.4* 4.4 4.3 3.4*  CL 97* 98  --  97* 93*  CO2 32 38*  --  36* 41*  GLUCOSE 98 94  --  111* 128*  BUN 25* 15  --  25* 22*  CREATININE 0.57 0.59  --  0.71 0.62  CALCIUM 8.9 9.0  --  8.7* 8.8*  MG 2.2 2.0  --  1.8  --   PHOS 3.4 2.6  --  1.3*  --     ABG: Recent Labs  Lab 03/07/20 1340 03/08/20 1848  PHART 7.545* 7.503*  PCO2ART 50.0* 52.8*  PO2ART 143* 114*  HCO3 43.3* 41.5*  O2SAT 99.0 99.0    Liver Function Tests: Recent Labs  Lab 03/07/20 1432 03/09/20 0336  AST 21 42*  ALT 22 33  ALKPHOS 41 37*  BILITOT 0.5 0.5  PROT 5.8* 5.3*  ALBUMIN 2.8* 2.6*   No results for input(s): LIPASE, AMYLASE in the last 168 hours. No results for input(s): AMMONIA in the last 168 hours.  CBC: Recent Labs   Lab 03/06/20 0735 03/07/20 0150 03/07/20 1340 03/08/20 0234 03/09/20 0336  WBC 13.3* 9.0  --  8.4 7.9  HGB 9.6* 9.6* 11.9* 7.9* 7.5*  HCT 32.3* 32.0* 35.0* 25.7* 24.9*  MCV 98.5 97.6  --  95.5 95.8  PLT 313 261  --  189 214    Cardiac Enzymes: No results for input(s): CKTOTAL, CKMB, CKMBINDEX, TROPONINI in the last 168 hours.  BNP (last 3 results) Recent Labs    09/06/19 1414 01/28/20 1503 02/25/20 1115  BNP 32.2 106.8* 282.7*    ProBNP (last 3 results) No results for input(s): PROBNP in the last 8760 hours.  Radiological Exams: No results found.  Assessment/Plan Active Problems:   Acute on chronic respiratory failure with hypoxia (HCC)   Seizure disorder (HCC)   COPD, severe (HCC)   Anxiety disorder   1. Acute on chronic respiratory failure hypoxia we will wean on pressure support 12/5 for 12 hours 2. Seizure disorder no active seizures noted at this time. 3. Severe COPD at baseline 4. Anxiety disorder no changes we will continue with present management   I have personally seen and evaluated the patient, evaluated laboratory and imaging results, formulated the assessment and plan and placed orders. The Patient requires  high complexity decision making with multiple systems involvement.  Rounds were done with the Respiratory Therapy Director and Staff therapists and discussed with nursing staff also.  Allyne Gee, MD [facility] Pulmonary Critical Care Medicine Sleep Medicine

## 2020-03-13 ENCOUNTER — Other Ambulatory Visit (HOSPITAL_COMMUNITY): Payer: PRIVATE HEALTH INSURANCE

## 2020-03-13 LAB — BLOOD GAS, ARTERIAL
Acid-Base Excess: 17.9 mmol/L — ABNORMAL HIGH (ref 0.0–2.0)
Bicarbonate: 45.1 mmol/L — ABNORMAL HIGH (ref 20.0–28.0)
FIO2: 35
O2 Saturation: 95.6 %
Patient temperature: 36.8
pCO2 arterial: 95 mmHg (ref 32.0–48.0)
pH, Arterial: 7.297 — ABNORMAL LOW (ref 7.350–7.450)
pO2, Arterial: 85.4 mmHg (ref 83.0–108.0)

## 2020-03-13 LAB — BASIC METABOLIC PANEL
Anion gap: 13 (ref 5–15)
BUN: 20 mg/dL (ref 6–20)
CO2: 38 mmol/L — ABNORMAL HIGH (ref 22–32)
Calcium: 9.4 mg/dL (ref 8.9–10.3)
Chloride: 92 mmol/L — ABNORMAL LOW (ref 98–111)
Creatinine, Ser: 0.64 mg/dL (ref 0.44–1.00)
GFR, Estimated: >60 mL/min (ref >60)
Glucose, Bld: 75 mg/dL (ref 70–99)
Potassium: 3.9 mmol/L (ref 3.5–5.1)
Sodium: 143 mmol/L (ref 135–145)

## 2020-03-13 LAB — CBC
HCT: 30.1 % — ABNORMAL LOW (ref 36.0–46.0)
Hemoglobin: 8.9 g/dL — ABNORMAL LOW (ref 12.0–15.0)
MCH: 29 pg (ref 26.0–34.0)
MCHC: 29.6 g/dL — ABNORMAL LOW (ref 30.0–36.0)
MCV: 98 fL (ref 80.0–100.0)
Platelets: 243 10*3/uL (ref 150–400)
RBC: 3.07 MIL/uL — ABNORMAL LOW (ref 3.87–5.11)
RDW: 15.2 % (ref 11.5–15.5)
WBC: 8.2 10*3/uL (ref 4.0–10.5)
nRBC: 0 % (ref 0.0–0.2)

## 2020-03-13 NOTE — Progress Notes (Signed)
Pulmonary Critical Care Medicine [facility] [place]   PULMONARY CRITICAL CARE SERVICE  PROGRESS NOTE  Date of Service: 03/13/2020  Kathleen Cameron  NID:[id number]  DOB: 04-25-1965   DOA: 03/08/2020  Referring Physician: Carron Curie, MD  HPI: Kathleen Cameron is a 54 y.o. female seen for follow up of Acute on Chronic Respiratory Failure.  Patient currently is on pressure support was able to do 10 hours yesterday and today will advance further  Medications: Reviewed on Rounds  Physical Exam:  Vitals: Temperature 97.0 pulse 69 respiratory rate 18 blood pressure is 125/83 saturations 100%  Ventilator Settings on pressure support FiO2 30% pressure 12/5  . General: Comfortable at this time . Eyes: Grossly normal lids, irises & conjunctiva . ENT: grossly tongue is normal . Neck: no obvious mass . Cardiovascular: S1 S2 normal no gallop . Respiratory: No rhonchi very coarse breath sounds . Abdomen: soft . Skin: no rash seen on limited exam . Musculoskeletal: not rigid . Psychiatric:unable to assess . Neurologic: no seizure no involuntary movements         Lab Data:   Basic Metabolic Panel: Recent Labs  Lab 03/07/20 0150 03/07/20 1340 03/08/20 0234 03/09/20 0336 03/13/20 0406  NA 143 139 140 138 143  K 3.4* 4.4 4.3 3.4* 3.9  CL 98  --  97* 93* 92*  CO2 38*  --  36* 41* 38*  GLUCOSE 94  --  111* 128* 75  BUN 15  --  25* 22* 20  CREATININE 0.59  --  0.71 0.62 0.64  CALCIUM 9.0  --  8.7* 8.8* 9.4  MG 2.0  --  1.8  --   --   PHOS 2.6  --  1.3*  --   --     ABG: Recent Labs  Lab 03/07/20 1340 03/08/20 1848  PHART 7.545* 7.503*  PCO2ART 50.0* 52.8*  PO2ART 143* 114*  HCO3 43.3* 41.5*  O2SAT 99.0 99.0    Liver Function Tests: Recent Labs  Lab 03/07/20 1432 03/09/20 0336  AST 21 42*  ALT 22 33  ALKPHOS 41 37*  BILITOT 0.5 0.5  PROT 5.8* 5.3*  ALBUMIN 2.8* 2.6*   No results for input(s): LIPASE, AMYLASE in the last 168 hours. No  results for input(s): AMMONIA in the last 168 hours.  CBC: Recent Labs  Lab 03/07/20 0150 03/07/20 1340 03/08/20 0234 03/09/20 0336 03/13/20 0406  WBC 9.0  --  8.4 7.9 8.2  HGB 9.6* 11.9* 7.9* 7.5* 8.9*  HCT 32.0* 35.0* 25.7* 24.9* 30.1*  MCV 97.6  --  95.5 95.8 98.0  PLT 261  --  189 214 243    Cardiac Enzymes: No results for input(s): CKTOTAL, CKMB, CKMBINDEX, TROPONINI in the last 168 hours.  BNP (last 3 results) Recent Labs    09/06/19 1414 01/28/20 1503 02/25/20 1115  BNP 32.2 106.8* 282.7*    ProBNP (last 3 results) No results for input(s): PROBNP in the last 8760 hours.  Radiological Exams: DG CHEST PORT 1 VIEW  Result Date: 03/13/2020 CLINICAL DATA:  Respiratory failure. EXAM: PORTABLE CHEST 1 VIEW COMPARISON:  03/08/2020.  CT 02/28/2020. FINDINGS: Tracheostomy tube feeding tube in stable position. Heart size normal. Stable right upper lobe pulmonary nodular densities again noted. These are best identified by prior CT. Nodular opacity noted the right lung base may represent a nipple shadow, follow-up exam with nipple markers suggested. Mild atelectatic changes right upper lung and right base. Tiny bilateral pleural effusions cannot be excluded. Degenerative change thoracic  spine. IMPRESSION: 1. Tracheostomy tube and feeding tube in stable position. 2. Stable right upper lobe pulmonary nodular densities again noted. These are best identified by prior CT. Nodular opacity noted over the right base, possibly a nipple shadow. Follow-up exam with nipple markers suggested. 3. Mild atelectatic changes right upper lung and right base. Tiny bilateral pleural effusions cannot be excluded. Electronically Signed   By: Maisie Fus  Register   On: 03/13/2020 06:16    Assessment/Plan Active Problems:   Acute on chronic respiratory failure with hypoxia (HCC)   Seizure disorder (HCC)   COPD, severe (HCC)   Anxiety disorder   1. Acute on chronic respiratory failure with hypoxia we will  continue to wean on pressure support as tolerated. 2. Seizure disorder no active seizures noted at this time. 3. Severe COPD patient is at baseline 4. Anxiety disorder at baseline   I have personally seen and evaluated the patient, evaluated laboratory and imaging results, formulated the assessment and plan and placed orders. The Patient requires high complexity decision making with multiple systems involvement.  Rounds were done with the Respiratory Therapy Director and Staff therapists and discussed with nursing staff also.  Yevonne Pax, MD [facility] Pulmonary Critical Care Medicine Sleep Medicine

## 2020-03-14 NOTE — Progress Notes (Signed)
Pulmonary Critical Care Medicine [facility] [place]   PULMONARY CRITICAL CARE SERVICE  PROGRESS NOTE  Date of Service: 03/14/2020  Kathleen Cameron  YQM:[id number]  DOB: Mar 02, 1966   DOA: 03/08/2020  Referring Physician: Carron Curie, MD  HPI: Kathleen Cameron is a 54 y.o. female seen for follow up of Acute on Chronic Respiratory Failure.  Was attempted on pressure support yesterday did not tolerate now is back on the vent on pressure control  Medications: Reviewed on Rounds  Physical Exam:  Vitals: Temperature is 98.0 pulse 87 respiratory rate 18 blood pressure is 109/72 saturations 100%  Ventilator Settings on pressure control FiO2 is 35% IP 25 with a PEEP of 5  . General: Comfortable at this time . Eyes: Grossly normal lids, irises & conjunctiva . ENT: grossly tongue is normal . Neck: no obvious mass . Cardiovascular: S1 S2 normal no gallop . Respiratory: No rhonchi very coarse breath sounds . Abdomen: soft . Skin: no rash seen on limited exam . Musculoskeletal: not rigid . Psychiatric:unable to assess . Neurologic: no seizure no involuntary movements         Lab Data:   Basic Metabolic Panel: Recent Labs  Lab 03/07/20 1340 03/08/20 0234 03/09/20 0336 03/13/20 0406  NA 139 140 138 143  K 4.4 4.3 3.4* 3.9  CL  --  97* 93* 92*  CO2  --  36* 41* 38*  GLUCOSE  --  111* 128* 75  BUN  --  25* 22* 20  CREATININE  --  0.71 0.62 0.64  CALCIUM  --  8.7* 8.8* 9.4  MG  --  1.8  --   --   PHOS  --  1.3*  --   --     ABG: Recent Labs  Lab 03/07/20 1340 03/08/20 1848 03/13/20 1651  PHART 7.545* 7.503* 7.297*  PCO2ART 50.0* 52.8* 95.0*  PO2ART 143* 114* 85.4  HCO3 43.3* 41.5* 45.1*  O2SAT 99.0 99.0 95.6    Liver Function Tests: Recent Labs  Lab 03/07/20 1432 03/09/20 0336  AST 21 42*  ALT 22 33  ALKPHOS 41 37*  BILITOT 0.5 0.5  PROT 5.8* 5.3*  ALBUMIN 2.8* 2.6*   No results for input(s): LIPASE, AMYLASE in the last 168 hours. No  results for input(s): AMMONIA in the last 168 hours.  CBC: Recent Labs  Lab 03/07/20 1340 03/08/20 0234 03/09/20 0336 03/13/20 0406  WBC  --  8.4 7.9 8.2  HGB 11.9* 7.9* 7.5* 8.9*  HCT 35.0* 25.7* 24.9* 30.1*  MCV  --  95.5 95.8 98.0  PLT  --  189 214 243    Cardiac Enzymes: No results for input(s): CKTOTAL, CKMB, CKMBINDEX, TROPONINI in the last 168 hours.  BNP (last 3 results) Recent Labs    09/06/19 1414 01/28/20 1503 02/25/20 1115  BNP 32.2 106.8* 282.7*    ProBNP (last 3 results) No results for input(s): PROBNP in the last 8760 hours.  Radiological Exams: DG CHEST PORT 1 VIEW  Result Date: 03/13/2020 CLINICAL DATA:  Respiratory failure. EXAM: PORTABLE CHEST 1 VIEW COMPARISON:  03/08/2020.  CT 02/28/2020. FINDINGS: Tracheostomy tube feeding tube in stable position. Heart size normal. Stable right upper lobe pulmonary nodular densities again noted. These are best identified by prior CT. Nodular opacity noted the right lung base may represent a nipple shadow, follow-up exam with nipple markers suggested. Mild atelectatic changes right upper lung and right base. Tiny bilateral pleural effusions cannot be excluded. Degenerative change thoracic spine. IMPRESSION: 1. Tracheostomy tube  and feeding tube in stable position. 2. Stable right upper lobe pulmonary nodular densities again noted. These are best identified by prior CT. Nodular opacity noted over the right base, possibly a nipple shadow. Follow-up exam with nipple markers suggested. 3. Mild atelectatic changes right upper lung and right base. Tiny bilateral pleural effusions cannot be excluded. Electronically Signed   By: Maisie Fus  Register   On: 03/13/2020 06:16    Assessment/Plan Active Problems:   Acute on chronic respiratory failure with hypoxia (HCC)   Seizure disorder (HCC)   COPD, severe (HCC)   Anxiety disorder   1. Acute on chronic respiratory failure hypoxia right now on full support and pressure control will  titrate oxygen and try pressure support once again 2. Seizure disorder there is no active seizures noted 3. Severe COPD at baseline we will continue to monitor 4. Anxiety disorder no change   I have personally seen and evaluated the patient, evaluated laboratory and imaging results, formulated the assessment and plan and placed orders. The Patient requires high complexity decision making with multiple systems involvement.  Rounds were done with the Respiratory Therapy Director and Staff therapists and discussed with nursing staff also.  Yevonne Pax, MD [facility] Pulmonary Critical Care Medicine Sleep Medicine

## 2020-03-15 NOTE — Progress Notes (Signed)
Pulmonary Critical Care Medicine [facility] [place]   PULMONARY CRITICAL CARE SERVICE  PROGRESS NOTE  Date of Service: 03/15/2020  Kathleen Cameron  HYQ:[id number]  DOB: 04-06-1966   DOA: 03/08/2020  Referring Physician: Carron Curie, MD  HPI: Kathleen Cameron is a 54 y.o. female seen for follow up of Acute on Chronic Respiratory Failure.  Patient currently on full support and pressure control mode has been on 35% FiO2 with an IP of 25 PEEP 5  Medications: Reviewed on Rounds  Physical Exam:  Vitals: No rhonchi no rales noted at this time  Ventilator Settings on pressure control FiO2 35% IP 25 PEEP 5  . General: Comfortable at this time . Eyes: Grossly normal lids, irises & conjunctiva . ENT: grossly tongue is normal . Neck: no obvious mass . Cardiovascular: S1 S2 normal no gallop . Respiratory: No rhonchi no rales are noted at this time . Abdomen: soft . Skin: no rash seen on limited exam . Musculoskeletal: not rigid . Psychiatric:unable to assess . Neurologic: no seizure no involuntary movements         Lab Data:   Basic Metabolic Panel: Recent Labs  Lab 03/09/20 0336 03/13/20 0406  NA 138 143  K 3.4* 3.9  CL 93* 92*  CO2 41* 38*  GLUCOSE 128* 75  BUN 22* 20  CREATININE 0.62 0.64  CALCIUM 8.8* 9.4    ABG: Recent Labs  Lab 03/08/20 1848 03/13/20 1651  PHART 7.503* 7.297*  PCO2ART 52.8* 95.0*  PO2ART 114* 85.4  HCO3 41.5* 45.1*  O2SAT 99.0 95.6    Liver Function Tests: Recent Labs  Lab 03/09/20 0336  AST 42*  ALT 33  ALKPHOS 37*  BILITOT 0.5  PROT 5.3*  ALBUMIN 2.6*   No results for input(s): LIPASE, AMYLASE in the last 168 hours. No results for input(s): AMMONIA in the last 168 hours.  CBC: Recent Labs  Lab 03/09/20 0336 03/13/20 0406  WBC 7.9 8.2  HGB 7.5* 8.9*  HCT 24.9* 30.1*  MCV 95.8 98.0  PLT 214 243    Cardiac Enzymes: No results for input(s): CKTOTAL, CKMB, CKMBINDEX, TROPONINI in the last 168  hours.  BNP (last 3 results) Recent Labs    09/06/19 1414 01/28/20 1503 02/25/20 1115  BNP 32.2 106.8* 282.7*    ProBNP (last 3 results) No results for input(s): PROBNP in the last 8760 hours.  Radiological Exams: No results found.  Assessment/Plan Active Problems:   Acute on chronic respiratory failure with hypoxia (HCC)   Seizure disorder (HCC)   COPD, severe (HCC)   Anxiety disorder   1. Acute on chronic respiratory failure with hypoxia [place] on pressureHas been on 35% FiO2 but requiring full support continue to assess the RSB I 2. Seizure disorder no active seizures noted at this time. 3. Severe COPD at baseline 4. Anxiety disorder patient is at baseline we will continue to monitor   I have personally seen and evaluated the patient, evaluated laboratory and imaging results, formulated the assessment and plan and placed orders. The Patient requires high complexity decision making with multiple systems involvement.  Rounds were done with the Respiratory Therapy Director and Staff therapists and discussed with nursing staff also.  Yevonne Pax, MD [facility] Pulmonary Critical Care Medicine Sleep Medicine

## 2020-03-16 NOTE — Progress Notes (Signed)
Pulmonary Critical Care Medicine [facility] [place]   PULMONARY CRITICAL CARE SERVICE  PROGRESS NOTE  Date of Service: 03/16/2020  Kathleen Cameron  [id number]:[id number]  DOB: 02-Sep-1965   DOA: 03/08/2020  Referring Physician: Carron Curie, MD  HPI: Early Ord is a 54 y.o. female seen for follow up of Acute on Chronic Respiratory Failure.  Remains on the ventilator and full support but not tolerating weaning secondary to severe COPD and hyperinflation noted on chest films also.  Reviewed her chart the last PFTs noted in the chart showed that her FEV1 was 0.56 L which was 29% of predicted.  This obviously makes her overall prognosis quite poor and her chance of weaning off the ventilator unlikely however we will continue to assess.  Medications: Reviewed on Rounds  Physical Exam:  Vitals: Temperature 96.6 pulse 58 respiratory rate 18 blood pressure is 114/89 saturations 100%  Ventilator Settings on pressure assist control FiO2 30% IP 25 PEEP 5  . General: Comfortable at this time . Eyes: Grossly normal lids, irises & conjunctiva . ENT: grossly tongue is normal . Neck: no obvious mass . Cardiovascular: S1 S2 normal no gallop . Respiratory: Diminished with few scattered rhonchi . Abdomen: soft . Skin: no rash seen on limited exam . Musculoskeletal: not rigid . Psychiatric:unable to assess . Neurologic: no seizure no involuntary movements         Lab Data:   Basic Metabolic Panel: Recent Labs  Lab 03/13/20 0406  NA 143  K 3.9  CL 92*  CO2 38*  GLUCOSE 75  BUN 20  CREATININE 0.64  CALCIUM 9.4    ABG: Recent Labs  Lab 03/13/20 1651  PHART 7.297*  PCO2ART 95.0*  PO2ART 85.4  HCO3 45.1*  O2SAT 95.6    Liver Function Tests: No results for input(s): AST, ALT, ALKPHOS, BILITOT, PROT, ALBUMIN in the last 168 hours. No results for input(s): LIPASE, AMYLASE in the last 168 hours. No results for input(s): AMMONIA in the last 168  hours.  CBC: Recent Labs  Lab 03/13/20 0406  WBC 8.2  HGB 8.9*  HCT 30.1*  MCV 98.0  PLT 243    Cardiac Enzymes: No results for input(s): CKTOTAL, CKMB, CKMBINDEX, TROPONINI in the last 168 hours.  BNP (last 3 results) Recent Labs    09/06/19 1414 01/28/20 1503 02/25/20 1115  BNP 32.2 106.8* 282.7*    ProBNP (last 3 results) No results for input(s): PROBNP in the last 8760 hours.  Radiological Exams: No results found.  Assessment/Plan Active Problems:   Acute on chronic respiratory failure with hypoxia (HCC)   Seizure disorder (HCC)   COPD, severe (HCC)   Anxiety disorder   1. Acute on chronic respiratory failure with hypoxia continue with full support on the ventilator she has been failing attempts at weaning.  I do not know that we will be going to be able to completely liberate this patient off the ventilator if at best she may be able to do some time off the ventilator but need ongoing support either portion of the day and night or all night previous FEV1 which appears to be during stable condition was 0.56 L 2. Severe COPD extremely poor prognosis patient has very severe reduction in her pulmonary functions may consider theophylline steroids she has an allergy to tiotropium it appears 3. Seizure disorder no active seizures noted at this time. 4. Anxiety disorder continue management per primary care team   I have personally seen and evaluated the patient, evaluated laboratory  and imaging results, formulated the assessment and plan and placed orders. The Patient requires high complexity decision making with multiple systems involvement.  Rounds were done with the Respiratory Therapy Director and Staff therapists and discussed with nursing staff also.  Allyne Gee, MD [facility] Pulmonary Critical Care Medicine Sleep Medicine

## 2020-03-17 LAB — CBC
HCT: 32.6 % — ABNORMAL LOW (ref 36.0–46.0)
Hemoglobin: 9.8 g/dL — ABNORMAL LOW (ref 12.0–15.0)
MCH: 29.4 pg (ref 26.0–34.0)
MCHC: 30.1 g/dL (ref 30.0–36.0)
MCV: 97.9 fL (ref 80.0–100.0)
Platelets: 219 10*3/uL (ref 150–400)
RBC: 3.33 MIL/uL — ABNORMAL LOW (ref 3.87–5.11)
RDW: 15 % (ref 11.5–15.5)
WBC: 9.5 10*3/uL (ref 4.0–10.5)
nRBC: 0 % (ref 0.0–0.2)

## 2020-03-17 LAB — BASIC METABOLIC PANEL
Anion gap: 11 (ref 5–15)
BUN: 34 mg/dL — ABNORMAL HIGH (ref 6–20)
CO2: 36 mmol/L — ABNORMAL HIGH (ref 22–32)
Calcium: 9.4 mg/dL (ref 8.9–10.3)
Chloride: 93 mmol/L — ABNORMAL LOW (ref 98–111)
Creatinine, Ser: 0.7 mg/dL (ref 0.44–1.00)
GFR, Estimated: >60 mL/min (ref >60)
Glucose, Bld: 168 mg/dL — ABNORMAL HIGH (ref 70–99)
Potassium: 4.7 mmol/L (ref 3.5–5.1)
Sodium: 140 mmol/L (ref 135–145)

## 2020-03-17 LAB — MAGNESIUM: Magnesium: 2.3 mg/dL (ref 1.7–2.4)

## 2020-03-17 NOTE — Progress Notes (Signed)
Pulmonary Critical Care Medicine [facility] [place]   PULMONARY CRITICAL CARE SERVICE  PROGRESS NOTE  Date of Service: 03/17/2020  Kathleen Cameron  ZOX:[id number]  DOB: Apr 16, 1966   DOA: 03/08/2020  Referring Physician: Carron Curie, MD  HPI: Destyne Goodreau is a 54 y.o. female seen for follow up of Acute on Chronic Respiratory Failure.  Patient is on the ventilator and full support Pressure control mode has been on 30% FiO2 good volumes are noted  Medications: Reviewed on Rounds  Physical Exam:  Vitals: Temperature is 96.8 pulse 62 respiratory 18 blood pressure is 140/72 saturations 100%  Ventilator Settings on pressure assist control FiO2 is 30% IP 25 tidal volume 795 PEEP 5  . General: Comfortable at this time . Eyes: Grossly normal lids, irises & conjunctiva . ENT: grossly tongue is normal . Neck: no obvious mass . Cardiovascular: S1 S2 normal no gallop . Respiratory: No rhonchi very coarse breath sounds . Abdomen: soft . Skin: no rash seen on limited exam . Musculoskeletal: not rigid . Psychiatric:unable to assess . Neurologic: no seizure no involuntary movements         Lab Data:   Basic Metabolic Panel: Recent Labs  Lab 03/13/20 0406 03/17/20 0457  NA 143 140  K 3.9 4.7  CL 92* 93*  CO2 38* 36*  GLUCOSE 75 168*  BUN 20 34*  CREATININE 0.64 0.70  CALCIUM 9.4 9.4  MG  --  2.3    ABG: Recent Labs  Lab 03/13/20 1651  PHART 7.297*  PCO2ART 95.0*  PO2ART 85.4  HCO3 45.1*  O2SAT 95.6    Liver Function Tests: No results for input(s): AST, ALT, ALKPHOS, BILITOT, PROT, ALBUMIN in the last 168 hours. No results for input(s): LIPASE, AMYLASE in the last 168 hours. No results for input(s): AMMONIA in the last 168 hours.  CBC: Recent Labs  Lab 03/13/20 0406 03/17/20 0457  WBC 8.2 9.5  HGB 8.9* 9.8*  HCT 30.1* 32.6*  MCV 98.0 97.9  PLT 243 219    Cardiac Enzymes: No results for input(s): CKTOTAL, CKMB, CKMBINDEX, TROPONINI  in the last 168 hours.  BNP (last 3 results) Recent Labs    09/06/19 1414 01/28/20 1503 02/25/20 1115  BNP 32.2 106.8* 282.7*    ProBNP (last 3 results) No results for input(s): PROBNP in the last 8760 hours.  Radiological Exams: No results found.  Assessment/Plan Active Problems:   Acute on chronic respiratory failure with hypoxia (HCC)   Seizure disorder (HCC)   COPD, severe (HCC)   Anxiety disorder   1. Acute on chronic respiratory failure hypoxia we will continue with pressure control for now titrate oxygen continue pulmonary toilet. 2. Seizure disorder no active seizure noted at this time 3. Severe COPD at baseline 4. Anxiety better controlled   I have personally seen and evaluated the patient, evaluated laboratory and imaging results, formulated the assessment and plan and placed orders. The Patient requires high complexity decision making with multiple systems involvement.  Rounds were done with the Respiratory Therapy Director and Staff therapists and discussed with nursing staff also.  Yevonne Pax, MD [facility] Pulmonary Critical Care Medicine Sleep Medicine

## 2020-03-18 ENCOUNTER — Other Ambulatory Visit (HOSPITAL_COMMUNITY): Payer: PRIVATE HEALTH INSURANCE

## 2020-03-18 LAB — CK TOTAL AND CKMB (NOT AT ARMC)
CK, MB: 4.5 ng/mL (ref 0.5–5.0)
Relative Index: INVALID (ref 0.0–2.5)
Total CK: 54 U/L (ref 38–234)

## 2020-03-18 LAB — D-DIMER, QUANTITATIVE: D-Dimer, Quant: 0.4 ug/mL-FEU (ref 0.00–0.50)

## 2020-03-18 LAB — TROPONIN I (HIGH SENSITIVITY): Troponin I (High Sensitivity): 15 ng/L (ref <18)

## 2020-03-18 NOTE — Consult Note (Signed)
Referring Physician: Dr. Owens Shark.  Kathleen Cameron is an 54 y.o. female.                       Chief Complaint: Uncontrolled hypertension and chest pain  HPI: 54 years old female with PMH of COPD, Depression, panic attacks, hypertension, seizure disorder has episodes of elevated blood pressure and chest pain. Her chest pain is severe, retrosternal and non-radiating.    Past Medical History:  Diagnosis Date  . Acute on chronic respiratory failure with hypoxia ([place])   . Anxiety disorder   . Asthma   . COPD (chronic obstructive pulmonary disease) ([place])   . COPD, severe ([place])   . Hypertension   . Seizure disorder [facility])       Past Surgical History:  Procedure Laterality Date  . WISDOM TOOTH EXTRACTION      Family History  Family history unknown: Yes   Social History:  reports that she has never smoked. She has never used smokeless tobacco. She reports current alcohol use of about 2.0 standard drinks of alcohol per week. She reports that she does not use drugs.  Allergies:  Allergies  Allergen Reactions  . Stiolto Respimat [Tiotropium Bromide-Olodaterol] Other (See Comments)    Severe headaches and rapid heartrate    Medications Prior to Admission  Medication Sig Dispense Refill  . amLODipine (NORVASC) 5 MG tablet Place 2 tablets (10 mg total) into feeding tube daily.    [name] arformoterol (BROVANA) 15 MCG/2ML NEBU Take 2 mLs (15 mcg total) by nebulization 2 (two) times daily. 120 mL   . aspirin 81 MG chewable tablet Place 1 tablet (81 mg total) into feeding tube daily.    . bisacodyl (DULCOLAX) 10 MG suppository Place 1 suppository (10 mg total) rectally daily as needed for severe constipation. 12 suppository 0  . budesonide (PULMICORT) 0.25 MG/2ML nebulizer solution Take 2 mLs (0.25 mg total) by nebulization 2 (two) times daily. 60 mL 12  . Chlorhexidine Gluconate Cloth 2 % PADS Apply 6 each topically daily.    . chlorhexidine gluconate, MEDLINE KIT, (PERIDEX) 0.12 % solution  15 mLs by Mouth Rinse route 2 (two) times daily. 120 mL 0  . clonazePAM (KLONOPIN) 1 MG tablet Place 1 tablet (1 mg total) into feeding tube 3 (three) times daily. 30 tablet 0  . enoxaparin (LOVENOX) 40 MG/0.4ML injection Inject 0.4 mLs (40 mg total) into the skin daily. 0 mL   . famotidine (PEPCID) 40 MG/5ML suspension Place 2.5 mLs (20 mg total) into feeding tube 2 (two) times daily. 50 mL 0  . fentaNYL (SUBLIMAZE) 100 MCG/2ML injection Inject 0.5 mLs (25 mcg total) into the vein every 2 (two) hours as needed for severe pain. 2 mL 0  . levothyroxine (SYNTHROID) 50 MCG tablet Take 1 tablet (50 mcg total) by mouth daily at 6 (six) AM. 30 tablet 3  . lip balm (CARMEX) ointment Apply topically as needed for lip care. 7 g 0  . metoCLOPramide (REGLAN) 10 MG tablet Place 1 tablet (10 mg total) into feeding tube every 8 (eight) hours.    . midazolam (VERSED) 2 MG/2ML SOLN injection Inject 2 mLs (2 mg total) into the vein every 2 (two) hours as needed for sedation. 2.5 mL 0  . Nutritional Supplements (FEEDING SUPPLEMENT, OSMOLITE 1.2 CAL,) LIQD Place 1,000 mLs into feeding tube continuous.  0  . pantoprazole sodium (PROTONIX) 40 mg/20 mL PACK Place 20 mLs (40 mg total) into feeding tube at bedtime. [place]  mL   . phenol (CHLORASEPTIC) 1.4 % LIQD Use as directed 1 spray in the mouth or throat as needed for throat irritation / pain.  0  . polyethylene glycol (MIRALAX / GLYCOLAX) 17 g packet Place 17 g into feeding tube daily as needed for mild constipation. 14 each 0  . [EXPIRED] potassium & sodium phosphates (PHOS-NAK) 280-160-250 MG PACK Place 1 packet into feeding tube every 8 (eight) hours for 2 doses. 120 each 0  . prednisoLONE acetate (PRED FORTE) 1 % ophthalmic suspension Place 1 drop into the left eye 4 (four) times daily. 5 mL 0  . [EXPIRED] predniSONE (DELTASONE) 20 MG tablet Place 1 tablet (20 mg total) into feeding tube daily with breakfast for 7 days. 7 tablet 0  . revefenacin (YUPELRI) 175 MCG/3ML  nebulizer solution Take 3 mLs (175 mcg total) by nebulization daily. 90 mL   . rosuvastatin (CRESTOR) 40 MG tablet Place 1 tablet (40 mg total) into feeding tube daily.    [name] senna-docusate (SENOKOT-S) 8.6-50 MG tablet Place 2 tablets into feeding tube at bedtime as needed for moderate constipation.    . sodium chloride (OCEAN) 0.65 % SOLN nasal spray Place 1 spray into both nostrils as needed for congestion.  0  . temazepam (RESTORIL) 15 MG capsule Place 1 capsule (15 mg total) into feeding tube at bedtime. 30 capsule 0  . venlafaxine (EFFEXOR) 37.5 MG tablet Place 1 tablet (37.5 mg total) into feeding tube 2 (two) times daily with a meal.      Results for orders placed or performed during the hospital encounter of 03/08/20 (from the past 48 hour(s))  Basic metabolic panel     Status: Abnormal   Collection Time: 03/17/20  4:57 AM  Result Value Ref Range   Sodium 140 135 - 145 mmol/L   Potassium 4.7 3.5 - 5.1 mmol/L   Chloride 93 (L) 98 - 111 mmol/L   CO2 36 (H) 22 - 32 mmol/L   Glucose, Bld 168 (H) 70 - 99 mg/dL    Comment: Glucose reference range applies only to samples taken after fasting for at least 8 hours.   BUN 34 (H) 6 - 20 mg/dL   Creatinine, Ser 0.70 0.44 - 1.00 mg/dL   Calcium 9.4 8.9 - 10.3 mg/dL   GFR, Estimated >60 >60 mL/min    Comment: (NOTE) Calculated using the CKD-EPI Creatinine Equation (2021)    Anion gap 11 5 - 15    Comment: Performed at [facility] [address], [place], [place] [address]  CBC     Status: Abnormal   Collection Time: 03/17/20  4:57 AM  Result Value Ref Range   WBC 9.5 4.0 - 10.5 K/uL   RBC 3.33 (L) 3.87 - 5.11 MIL/uL   Hemoglobin 9.8 (L) 12.0 - 15.0 g/dL   HCT 32.6 (L) 36 - 46 %   MCV 97.9 80.0 - 100.0 fL   MCH 29.4 26.0 - 34.0 pg   MCHC 30.1 30.0 - 36.0 g/dL   RDW 15.0 11.5 - 15.5 %   Platelets 219 150 - 400 K/uL   nRBC 0.0 0.0 - 0.2 %    Comment: Performed at [facility], [place] [address], [place], [place] [address]   Magnesium     Status: None   Collection Time: 03/17/20  4:57 AM  Result Value Ref Range   Magnesium 2.3 1.7 - 2.4 mg/dL    Comment: Performed at [facility]  [address], [place], [place] [address]  D-dimer, quantitative (not at [facility])     Status: None   Collection Time: 03/18/20 10:14 AM  Result Value Ref Range   D-Dimer, Quant 0.40 0.00 - 0.50 ug/mL-FEU    Comment: (NOTE) At the manufacturer cut-off value of 0.5 g/mL FEU, this assay has a negative predictive value of 95-100%.This assay is intended for use in conjunction with a clinical pretest probability (PTP) assessment model to exclude pulmonary embolism (PE) and deep venous thrombosis (DVT) in outpatients suspected of PE or DVT. Results should be correlated with clinical presentation. Performed at [facility], [place] [address] [place], [place] [address]    DG CHEST PORT 1 VIEW  Result Date: 03/18/2020 CLINICAL DATA:  Shortness of breath EXAM: PORTABLE CHEST 1 VIEW COMPARISON:  March 13, 2020 chest radiograph and chest CT February 28, 2020 FINDINGS: Tracheostomy catheter tip is 5.5 cm above the carina. Enteric tube tip is in the proximal stomach. There is atelectatic change in the lung bases. Equivocal pleural effusions noted bilaterally. No edema or airspace opacity. Nodular opacities seen in the right upper lobe, stable compared to recent studies. Heart size and pulmonary vascularity are within normal limits. No adenopathy. No bone lesions. IMPRESSION: Nodular opacities right upper lobe, unchanged and of uncertain etiology. Areas of atelectatic change bilaterally. Equivocal pleural effusions bilaterally. No edema or airspace opacity. Stable cardiac silhouette. Tube positions as described without evident pneumothorax. Electronically Signed   By: Lowella Grip III M.D.   On: 03/18/2020 09:17    Review Of Systems As per HPI.  P: 95, R: 26, BP: 180/111 and O2 sat 92 %  There is no height or weight on file to  calculate BMI. General appearance: alert, cooperative, appears stated age and no distress Head: Normocephalic, atraumatic. Eyes: Brown eyes, Pale pink conjunctiva, corneas clear.  Neck: No adenopathy, no carotid bruit, no JVD, supple, symmetrical, tracheostomy tube in place. Resp: Coarse crackles to auscultation bilaterally. Cardio: Regular rate and rhythm, S1, S2 normal, II/VI systolic murmur, no click, rub or gallop GI: Soft, bowel sounds normal; no organomegaly. Extremities: No edema, cyanosis or clubbing. Skin: Warm and dry.  Neurologic: Alert and oriented X 1, normal strength.   Assessment/Plan Acute on chronic respiratory failure with hypoxia COPD Uncontrolled hypertension Panic attack Chest pain Seizure disorder H/O Right upper lung nodules  Add isosorbide mononitrate for BP and chest pain. Morphine 2 mg. IV for chest pain.  Time spent: Review of old records, Lab, x-rays, EKG, other cardiac tests, examination, discussion with patient/Nurse/Tech/Physician over 70 minutes.  Birdie Riddle, MD  03/18/2020, 11:27 AM

## 2020-03-19 ENCOUNTER — Other Ambulatory Visit (HOSPITAL_COMMUNITY): Payer: PRIVATE HEALTH INSURANCE

## 2020-03-19 LAB — BLOOD GAS, ARTERIAL
Acid-Base Excess: 13.6 mmol/L — ABNORMAL HIGH (ref 0.0–2.0)
Acid-Base Excess: 16.8 mmol/L — ABNORMAL HIGH (ref 0.0–2.0)
Acid-Base Excess: 13.6 mmol/L — ABNORMAL HIGH (ref 0.0–2.0)
Acid-Base Excess: 13.7 mmol/L — ABNORMAL HIGH (ref 0.0–2.0)
Bicarbonate: 40.2 mmol/L — ABNORMAL HIGH (ref 20.0–28.0)
Bicarbonate: 43.9 mmol/L — ABNORMAL HIGH (ref 20.0–28.0)
Bicarbonate: 40.6 mmol/L — ABNORMAL HIGH (ref 20.0–28.0)
Bicarbonate: 39.8 mmol/L — ABNORMAL HIGH (ref 20.0–28.0)
FIO2: 35
FIO2: 45
FIO2: 50
FIO2: 45
O2 Saturation: 89.1 %
O2 Saturation: 90.3 %
O2 Saturation: 97.2 %
O2 Saturation: 98.3 %
Patient temperature: 36.9
Patient temperature: 36.9
Patient temperature: 37
Patient temperature: 37.5
pCO2 arterial: 81.5 mmHg (ref 32.0–48.0)
pCO2 arterial: 92.7 mmHg (ref 32.0–48.0)
pCO2 arterial: 87.9 mmHg (ref 32.0–48.0)
pCO2 arterial: 76.5 mmHg (ref 32.0–48.0)
pH, Arterial: 7.313 — ABNORMAL LOW (ref 7.350–7.450)
pH, Arterial: 7.296 — ABNORMAL LOW (ref 7.350–7.450)
pH, Arterial: 7.287 — ABNORMAL LOW (ref 7.350–7.450)
pH, Arterial: 7.34 — ABNORMAL LOW (ref 7.350–7.450)
pO2, Arterial: 60.9 mmHg — ABNORMAL LOW (ref 83.0–108.0)
pO2, Arterial: 65.6 mmHg — ABNORMAL LOW (ref 83.0–108.0)
pO2, Arterial: 100 mmHg (ref 83.0–108.0)
pO2, Arterial: 117 mmHg — ABNORMAL HIGH (ref 83.0–108.0)

## 2020-03-19 LAB — CK TOTAL AND CKMB (NOT AT ARMC)
CK, MB: 6.1 ng/mL — ABNORMAL HIGH (ref 0.5–5.0)
CK, MB: 6.3 ng/mL — ABNORMAL HIGH (ref 0.5–5.0)
Relative Index: INVALID (ref 0.0–2.5)
Relative Index: INVALID (ref 0.0–2.5)
Total CK: 72 U/L (ref 38–234)
Total CK: 64 U/L (ref 38–234)

## 2020-03-19 LAB — BASIC METABOLIC PANEL
Anion gap: 8 (ref 5–15)
BUN: 35 mg/dL — ABNORMAL HIGH (ref 6–20)
CO2: 40 mmol/L — ABNORMAL HIGH (ref 22–32)
Calcium: 9.2 mg/dL (ref 8.9–10.3)
Chloride: 91 mmol/L — ABNORMAL LOW (ref 98–111)
Creatinine, Ser: 0.62 mg/dL (ref 0.44–1.00)
GFR, Estimated: >60 mL/min (ref >60)
Glucose, Bld: 144 mg/dL — ABNORMAL HIGH (ref 70–99)
Potassium: 4.3 mmol/L (ref 3.5–5.1)
Sodium: 139 mmol/L (ref 135–145)

## 2020-03-19 LAB — CBC
HCT: 34.9 % — ABNORMAL LOW (ref 36.0–46.0)
Hemoglobin: 10.3 g/dL — ABNORMAL LOW (ref 12.0–15.0)
MCH: 28.9 pg (ref 26.0–34.0)
MCHC: 29.5 g/dL — ABNORMAL LOW (ref 30.0–36.0)
MCV: 98 fL (ref 80.0–100.0)
Platelets: 350 10*3/uL (ref 150–400)
RBC: 3.56 MIL/uL — ABNORMAL LOW (ref 3.87–5.11)
RDW: 15.4 % (ref 11.5–15.5)
WBC: 15.6 10*3/uL — ABNORMAL HIGH (ref 4.0–10.5)
nRBC: 0 % (ref 0.0–0.2)

## 2020-03-19 LAB — TROPONIN I (HIGH SENSITIVITY)
Troponin I (High Sensitivity): 13 ng/L (ref <18)
Troponin I (High Sensitivity): 18 ng/L — ABNORMAL HIGH (ref <18)

## 2020-03-19 NOTE — Progress Notes (Signed)
Pulmonary Critical Care Medicine [facility] [place]   PULMONARY CRITICAL CARE SERVICE  PROGRESS NOTE  Date of Service: 03/19/2020  Kathleen Cameron  OZD:[id number]  DOB: July 01, 1965   DOA: 03/08/2020  Referring Physician: Carron Curie, MD  HPI: Kathleen Cameron is a 54 y.o. female seen for follow up of Acute on Chronic Respiratory Failure.  Patient is on pressure control mode currently on 35% FiO2 has been on tolerating these current settings fairly well.  Not doing any weaning at this time but respiratory therapy will check the mechanics and assess  Medications: Reviewed on Rounds  Physical Exam:  Vitals: Temperature 96.7 pulse 79 respiratory rate 28 blood pressure is 179/89 saturations 94%  Ventilator Settings on pressure assist control FiO2 is 35% IP 25 PEEP 5  . General: Comfortable at this time . Eyes: Grossly normal lids, irises & conjunctiva . ENT: grossly tongue is normal . Neck: no obvious mass . Cardiovascular: S1 S2 normal no gallop . Respiratory: No rhonchi very coarse breath sounds . Abdomen: soft . Skin: no rash seen on limited exam . Musculoskeletal: not rigid . Psychiatric:unable to assess . Neurologic: no seizure no involuntary movements         Lab Data:   Basic Metabolic Panel: Recent Labs  Lab 03/13/20 0406 03/17/20 0457  NA 143 140  K 3.9 4.7  CL 92* 93*  CO2 38* 36*  GLUCOSE 75 168*  BUN 20 34*  CREATININE 0.64 0.70  CALCIUM 9.4 9.4  MG  --  2.3    ABG: Recent Labs  Lab 03/13/20 1651  PHART 7.297*  PCO2ART 95.0*  PO2ART 85.4  HCO3 45.1*  O2SAT 95.6    Liver Function Tests: No results for input(s): AST, ALT, ALKPHOS, BILITOT, PROT, ALBUMIN in the last 168 hours. No results for input(s): LIPASE, AMYLASE in the last 168 hours. No results for input(s): AMMONIA in the last 168 hours.  CBC: Recent Labs  Lab 03/13/20 0406 03/17/20 0457  WBC 8.2 9.5  HGB 8.9* 9.8*  HCT 30.1* 32.6*  MCV 98.0 97.9  PLT 243 219     Cardiac Enzymes: Recent Labs  Lab 03/18/20 2011 03/19/20 0024 03/19/20 0348  CKTOTAL 54 72 64  CKMB 4.5 6.1* 6.3*    BNP (last 3 results) Recent Labs    09/06/19 1414 01/28/20 1503 02/25/20 1115  BNP 32.2 106.8* 282.7*    ProBNP (last 3 results) No results for input(s): PROBNP in the last 8760 hours.  Radiological Exams: DG CHEST PORT 1 VIEW  Result Date: 03/18/2020 CLINICAL DATA:  Shortness of breath EXAM: PORTABLE CHEST 1 VIEW COMPARISON:  March 13, 2020 chest radiograph and chest CT February 28, 2020 FINDINGS: Tracheostomy catheter tip is 5.5 cm above the carina. Enteric tube tip is in the proximal stomach. There is atelectatic change in the lung bases. Equivocal pleural effusions noted bilaterally. No edema or airspace opacity. Nodular opacities seen in the right upper lobe, stable compared to recent studies. Heart size and pulmonary vascularity are within normal limits. No adenopathy. No bone lesions. IMPRESSION: Nodular opacities right upper lobe, unchanged and of uncertain etiology. Areas of atelectatic change bilaterally. Equivocal pleural effusions bilaterally. No edema or airspace opacity. Stable cardiac silhouette. Tube positions as described without evident pneumothorax. Electronically Signed   By: Bretta Bang III M.D.   On: 03/18/2020 09:17    Assessment/Plan Active Problems:   Acute on chronic respiratory failure with hypoxia (HCC)   Seizure disorder (HCC)   COPD, severe (HCC)  Anxiety disorder   1. Acute on chronic respiratory failure hypoxia we will continue with full support on pressure control currently on 35% FiO2 for resting mode respiratory therapy will assess the RSB I mechanics and try to wean 2. Seizure disorder no active seizures noted at this time. 3. Severe COPD at baseline 4. Anxiety disorder no change continue to follow   I have personally seen and evaluated the patient, evaluated laboratory and imaging results, formulated the  assessment and plan and placed orders. The Patient requires high complexity decision making with multiple systems involvement.  Rounds were done with the Respiratory Therapy Director and Staff therapists and discussed with nursing staff also.  Yevonne Pax, MD [facility] Pulmonary Critical Care Medicine Sleep Medicine

## 2020-03-20 ENCOUNTER — Other Ambulatory Visit (HOSPITAL_COMMUNITY): Payer: PRIVATE HEALTH INSURANCE

## 2020-03-20 LAB — BLOOD GAS, ARTERIAL
Acid-Base Excess: 9.3 mmol/L — ABNORMAL HIGH (ref 0.0–2.0)
Bicarbonate: 35.6 mmol/L — ABNORMAL HIGH (ref 20.0–28.0)
FIO2: 45
O2 Saturation: 98 %
Patient temperature: 36.8
pCO2 arterial: 72.4 mmHg (ref 32.0–48.0)
pH, Arterial: 7.311 — ABNORMAL LOW (ref 7.350–7.450)
pO2, Arterial: 107 mmHg (ref 83.0–108.0)

## 2020-03-20 LAB — CBC
HCT: 30.2 % — ABNORMAL LOW (ref 36.0–46.0)
Hemoglobin: 9 g/dL — ABNORMAL LOW (ref 12.0–15.0)
MCH: 29.2 pg (ref 26.0–34.0)
MCHC: 29.8 g/dL — ABNORMAL LOW (ref 30.0–36.0)
MCV: 98.1 fL (ref 80.0–100.0)
Platelets: 231 10*3/uL (ref 150–400)
RBC: 3.08 MIL/uL — ABNORMAL LOW (ref 3.87–5.11)
RDW: 15.9 % — ABNORMAL HIGH (ref 11.5–15.5)
WBC: 9.2 10*3/uL (ref 4.0–10.5)
nRBC: 0 % (ref 0.0–0.2)

## 2020-03-20 LAB — URINALYSIS, ROUTINE W REFLEX MICROSCOPIC
Bilirubin Urine: NEGATIVE
Glucose, UA: NEGATIVE mg/dL
Hgb urine dipstick: NEGATIVE
Ketones, ur: NEGATIVE mg/dL
Leukocytes,Ua: NEGATIVE
Nitrite: NEGATIVE
Protein, ur: NEGATIVE mg/dL
Specific Gravity, Urine: 1.006 (ref 1.005–1.030)
pH: 5 (ref 5.0–8.0)

## 2020-03-20 LAB — BASIC METABOLIC PANEL
Anion gap: 8 (ref 5–15)
BUN: 67 mg/dL — ABNORMAL HIGH (ref 6–20)
CO2: 34 mmol/L — ABNORMAL HIGH (ref 22–32)
Calcium: 8.8 mg/dL — ABNORMAL LOW (ref 8.9–10.3)
Chloride: 92 mmol/L — ABNORMAL LOW (ref 98–111)
Creatinine, Ser: 1.22 mg/dL — ABNORMAL HIGH (ref 0.44–1.00)
GFR, Estimated: 52 mL/min — ABNORMAL LOW (ref >60)
Glucose, Bld: 190 mg/dL — ABNORMAL HIGH (ref 70–99)
Potassium: 4.7 mmol/L (ref 3.5–5.1)
Sodium: 134 mmol/L — ABNORMAL LOW (ref 135–145)

## 2020-03-20 LAB — ECHOCARDIOGRAM LIMITED: Weight: 2010.6 oz

## 2020-03-20 LAB — T4, FREE: Free T4: 0.39 ng/dL — ABNORMAL LOW (ref 0.61–1.12)

## 2020-03-20 LAB — TSH: TSH: 18.763 u[IU]/mL — ABNORMAL HIGH (ref 0.350–4.500)

## 2020-03-20 NOTE — Progress Notes (Signed)
  Echocardiogram 2D Echocardiogram has been performed.  Tye Savoy 03/20/2020, 11:38 AM

## 2020-03-20 NOTE — Progress Notes (Signed)
Pulmonary Critical Care Medicine [facility] [place]   PULMONARY CRITICAL CARE SERVICE  PROGRESS NOTE  Date of Service: 03/20/2020  Kathleen Cameron  HGD:[id number]  DOB: 1965-08-30   DOA: 03/08/2020  Referring Physician: Carron Curie, MD  HPI: Kathleen Cameron is a 54 y.o. female seen for follow up of Acute on Chronic Respiratory Failure.  Patient is comfortable right now without distress at this time has been on pressure assist control right now remains on 35% FiO2  Medications: Reviewed on Rounds  Physical Exam:  Vitals: Temperature is 98.3 pulse 83 respiratory rate 34 blood pressure is 110/83 saturations 100%  Ventilator Settings patient currently is on pressure assist control FiO2 is 35% IP 30 PEEP of 5  . General: Comfortable at this time . Eyes: Grossly normal lids, irises & conjunctiva . ENT: grossly tongue is normal . Neck: no obvious mass . Cardiovascular: S1 S2 normal no gallop . Respiratory: No rhonchi no rales noted at this time . Abdomen: soft . Skin: no rash seen on limited exam . Musculoskeletal: not rigid . Psychiatric:unable to assess . Neurologic: no seizure no involuntary movements         Lab Data:   Basic Metabolic Panel: Recent Labs  Lab 03/17/20 0457 03/19/20 1023 03/20/20 0501  NA 140 139 134*  K 4.7 4.3 4.7  CL 93* 91* 92*  CO2 36* 40* 34*  GLUCOSE 168* 144* 190*  BUN 34* 35* 67*  CREATININE 0.70 0.62 1.22*  CALCIUM 9.4 9.2 8.8*  MG 2.3  --   --     ABG: Recent Labs  Lab 03/19/20 0835 03/19/20 1040 03/19/20 1425 03/19/20 1730 03/20/20 0615  PHART 7.313* 7.296* 7.287* 7.340* 7.311*  PCO2ART 81.5* 92.7* 87.9* 76.5* 72.4*  PO2ART 60.9* 65.6* 100 117* 107  HCO3 40.2* 43.9* 40.6* 39.8* 35.6*  O2SAT 89.1 90.3 97.2 98.3 98.0    Liver Function Tests: No results for input(s): AST, ALT, ALKPHOS, BILITOT, PROT, ALBUMIN in the last 168 hours. No results for input(s): LIPASE, AMYLASE in the last 168 hours. No  results for input(s): AMMONIA in the last 168 hours.  CBC: Recent Labs  Lab 03/17/20 0457 03/19/20 1023 03/20/20 0501  WBC 9.5 15.6* 9.2  HGB 9.8* 10.3* 9.0*  HCT 32.6* 34.9* 30.2*  MCV 97.9 98.0 98.1  PLT 219 350 231    Cardiac Enzymes: Recent Labs  Lab 03/18/20 2011 03/19/20 0024 03/19/20 0348  CKTOTAL 54 72 64  CKMB 4.5 6.1* 6.3*    BNP (last 3 results) Recent Labs    09/06/19 1414 01/28/20 1503 02/25/20 1115  BNP 32.2 106.8* 282.7*    ProBNP (last 3 results) No results for input(s): PROBNP in the last 8760 hours.  Radiological Exams: CT HEAD WO CONTRAST  Result Date: 03/19/2020 CLINICAL DATA:  Mental status change, unknown cause. Sudden change in mental status. EXAM: CT HEAD WITHOUT CONTRAST TECHNIQUE: Contiguous axial images were obtained from the base of the skull through the vertex without intravenous contrast. COMPARISON:  Head CT 02/28/2020. FINDINGS: Brain: Mild cerebral atrophy. Mild-to-moderate patchy hypodensity within the cerebral white matter. Redemonstrated chronic focus of gliosis within the body of the corpus callosum. Findings are nonspecific but most commonly related to chronic small vessel ischemia. There is no acute intracranial hemorrhage. No demarcated cortical infarct. No extra-axial fluid collection. No evidence of intracranial mass. No midline shift. Vascular: No hyperdense vessel.  Atherosclerotic calcifications. Skull: Normal. Negative for fracture or focal lesion. Sinuses/Orbits: Visualized orbits show no acute finding. Small mucous  retention cyst and mild mucosal thickening within the right maxillary sinus. IMPRESSION: No evidence of acute intracranial abnormality. Stable mild-to-moderate cerebral white matter disease. Redemonstrated chronic focus of gliosis within the callosal. Findings are nonspecific but most commonly related to chronic small vessel ischemia. Mild cerebral atrophy. Right maxillary sinusitis. Electronically Signed   By: Jackey Loge DO   On: 03/19/2020 09:18   DG CHEST PORT 1 VIEW  Result Date: 03/20/2020 CLINICAL DATA:  Dyspnea EXAM: PORTABLE CHEST 1 VIEW COMPARISON:  03/18/2020 FINDINGS: Tracheostomy and nasoenteric feeding tube with its tip just within the stomach beyond the gastroesophageal junction are unchanged. The lungs are symmetrically hyperinflated in keeping with changes of underlying COPD. Nodules at the right apex are again identified, better evaluated on prior chest CT of 02/28/2020. No pneumothorax or pleural effusion. Cardiac size within normal limits. Pulmonary vascularity is normal. No acute bone abnormality. IMPRESSION: No active disease.  COPD. Electronically Signed   By: Helyn Numbers MD   On: 03/20/2020 06:36    Assessment/Plan Active Problems:   Acute on chronic respiratory failure with hypoxia (HCC)   Seizure disorder (HCC)   COPD, severe (HCC)   Anxiety disorder   1. Acute on chronic respiratory failure hypoxia we will continue with full support on the ventilator.  Patient is not tolerating weaning very well.  Respiratory therapy will continue to assess. 2. Seizure disorder no active seizure noted at this time. 3. Severe COPD at baseline 4. Anxiety continue with present management supportive care per primary care team   I have personally seen and evaluated the patient, evaluated laboratory and imaging results, formulated the assessment and plan and placed orders. The Patient requires high complexity decision making with multiple systems involvement.  Rounds were done with the Respiratory Therapy Director and Staff therapists and discussed with nursing staff also.  Yevonne Pax, MD [facility] Pulmonary Critical Care Medicine Sleep Medicine

## 2020-03-21 LAB — URINE CULTURE: Culture: 20000 — AB

## 2020-03-21 LAB — BLOOD GAS, ARTERIAL
Acid-Base Excess: 11.1 mmol/L — ABNORMAL HIGH (ref 0.0–2.0)
Bicarbonate: 37.2 mmol/L — ABNORMAL HIGH (ref 20.0–28.0)
FIO2: 35
O2 Saturation: 90.4 %
Patient temperature: 36.4
pCO2 arterial: 70.7 mmHg (ref 32.0–48.0)
pH, Arterial: 7.338 — ABNORMAL LOW (ref 7.350–7.450)
pO2, Arterial: 59.8 mmHg — ABNORMAL LOW (ref 83.0–108.0)

## 2020-03-21 LAB — CULTURE, RESPIRATORY W GRAM STAIN

## 2020-03-21 LAB — VANCOMYCIN, TROUGH
Vancomycin Tr: 29 ug/mL (ref 15–20)
Vancomycin Tr: 20 ug/mL (ref 15–20)

## 2020-03-21 NOTE — Progress Notes (Signed)
Pulmonary Critical Care Medicine [facility] [place]   PULMONARY CRITICAL CARE SERVICE  PROGRESS NOTE  Date of Service: 03/21/2020  Ashlen Kiger  UTM:[id number]  DOB: [birth year]   DOA: 03/08/2020  Referring Physician: Carron Curie, MD  HPI: Hedaya Latendresse is a 54 y.o. female seen for follow up of Acute on Chronic Respiratory Failure.  Patient is on full support and pressure control mode currently has been on 35% FiO2 and is on an IP of 30  Medications: Reviewed on Rounds  Physical Exam:  Vitals: Temperature is 96.0 pulse 74 respiratory rate 28 blood pressure is 115/81 saturations 98%  Ventilator Settings patient is on pressure assist control FiO2 35% tidal volume is 439 PEEP 5 IP 30  . General: Comfortable at this time . Eyes: Grossly normal lids, irises & conjunctiva . ENT: grossly tongue is normal . Neck: no obvious mass . Cardiovascular: S1 S2 normal no gallop . Respiratory: Coarse rhonchi expansion is equal . Abdomen: soft . Skin: no rash seen on limited exam . Musculoskeletal: not rigid . Psychiatric:unable to assess . Neurologic: no seizure no involuntary movements         Lab Data:   Basic Metabolic Panel: Recent Labs  Lab 03/17/20 0457 03/19/20 1023 03/20/20 0501  NA 140 139 134*  K 4.7 4.3 4.7  CL 93* 91* 92*  CO2 36* 40* 34*  GLUCOSE 168* 144* 190*  BUN 34* 35* 67*  CREATININE 0.70 0.62 1.22*  CALCIUM 9.4 9.2 8.8*  MG 2.3  --   --     ABG: Recent Labs  Lab 03/19/20 1040 03/19/20 1425 03/19/20 1730 03/20/20 0615 03/21/20 1020  PHART 7.296* 7.287* 7.340* 7.311* 7.338*  PCO2ART 92.7* 87.9* 76.5* 72.4* 70.7*  PO2ART 65.6* 100 117* 107 59.8*  HCO3 43.9* 40.6* 39.8* 35.6* 37.2*  O2SAT 90.3 97.2 98.3 98.0 90.4    Liver Function Tests: No results for input(s): AST, ALT, ALKPHOS, BILITOT, PROT, ALBUMIN in the last 168 hours. No results for input(s): LIPASE, AMYLASE in the last 168 hours. No results for input(s): AMMONIA in  the last 168 hours.  CBC: Recent Labs  Lab 03/17/20 0457 03/19/20 1023 03/20/20 0501  WBC 9.5 15.6* 9.2  HGB 9.8* 10.3* 9.0*  HCT 32.6* 34.9* 30.2*  MCV 97.9 98.0 98.1  PLT 219 350 231    Cardiac Enzymes: Recent Labs  Lab 03/18/20 2011 03/19/20 0024 03/19/20 0348  CKTOTAL 54 72 64  CKMB 4.5 6.1* 6.3*    BNP (last 3 results) Recent Labs    09/06/19 1414 01/28/20 1503 02/25/20 1115  BNP 32.2 106.8* 282.7*    ProBNP (last 3 results) No results for input(s): PROBNP in the last 8760 hours.  Radiological Exams: DG CHEST PORT 1 VIEW  Result Date: 03/20/2020 CLINICAL DATA:  Dyspnea EXAM: PORTABLE CHEST 1 VIEW COMPARISON:  03/18/2020 FINDINGS: Tracheostomy and nasoenteric feeding tube with its tip just within the stomach beyond the gastroesophageal junction are unchanged. The lungs are symmetrically hyperinflated in keeping with changes of underlying COPD. Nodules at the right apex are again identified, better evaluated on prior chest CT of 02/28/2020. No pneumothorax or pleural effusion. Cardiac size within normal limits. Pulmonary vascularity is normal. No acute bone abnormality. IMPRESSION: No active disease.  COPD. Electronically Signed   By: Helyn Numbers MD   On: 03/20/2020 06:36   ECHOCARDIOGRAM LIMITED  Result Date: 03/20/2020    ECHOCARDIOGRAM LIMITED REPORT   Patient Name:   MEGEN MADEWELL Date of Exam: 03/20/2020 Medical Rec #:  [id number]           Height:       64.0 in Accession #:    [id number]          Weight:       125.7 lb Date of Birth:  20-Apr-1966            BSA:          1.606 m Patient Age:    54 years            BP:           103/71 mmHg Patient Gender: F                   HR:           81 bpm. Exam Location:  Inpatient Procedure: Limited Echo Indications:     Chest Pain R07.9  History:         Patient has prior history of Echocardiogram examinations, most                  recent 02/26/2020. COPD; Risk Factors:Hypertension.  Sonographer:     Thurman Coyer RDCS (AE) Referring Phys:  [id number] Florian Buff BROWN Diagnosing Phys: Orpah Cobb MD  Sonographer Comments: Technically challenging study due to limited acoustic windows and echo performed with patient supine and on artificial respirator. IMPRESSIONS  1. Left ventricular ejection fraction, by estimation, is 60 to 65%. The left ventricle has normal function. The left ventricle has no regional wall motion abnormalities. There is mild concentric left ventricular hypertrophy. Left ventricular diastolic function could not be evaluated.  2. Right ventricular systolic function is normal. The right ventricular size is normal.  3. The pericardial effusion is anterior to the right ventricle.  4. The mitral valve is grossly normal.  5. The aortic valve is grossly normal. There is mild calcification of the aortic valve. FINDINGS  Left Ventricle: Left ventricular ejection fraction, by estimation, is 60 to 65%. The left ventricle has normal function. The left ventricle has no regional wall motion abnormalities. The left ventricular internal cavity size was normal in size. There is  mild concentric left ventricular hypertrophy. Left ventricular diastolic function could not be evaluated. Right Ventricle: The right ventricular size is normal. No increase in right ventricular wall thickness. Right ventricular systolic function is normal. Left Atrium: Left atrial size was not well visualized. Right Atrium: Right atrial size was not well visualized. Pericardium: Trivial pericardial effusion is present. The pericardial effusion is anterior to the right ventricle. Mitral Valve: The mitral valve is grossly normal. Tricuspid Valve: The tricuspid valve is grossly normal. Aortic Valve: The aortic valve is grossly normal. There is mild calcification of the aortic valve. Pulmonic Valve: The pulmonic valve was not well visualized. Aorta: The aortic root was not well visualized. Venous: The inferior vena cava was not well  visualized. IAS/Shunts: The interatrial septum was not well visualized. Orpah Cobb MD Electronically signed by Orpah Cobb MD Signature Date/Time: 03/20/2020/3:22:23 PM    Final     Assessment/Plan Active Problems:   Acute on chronic respiratory failure with hypoxia (HCC)   Seizure disorder (HCC)   COPD, severe (HCC)   Anxiety disorder   1. Acute on chronic respiratory failure with hypoxia patient currently is on pressure assist control on 35% FiO2 with a PEEP of 5 and IPF 30 respiratory therapy will assess the RSB I mechanics once again 2. Seizure disorder no active seizures noted. 3. Severe COPD  at baseline medical management 4. Anxiety disorder right now appears to be controlled.   I have personally seen and evaluated the patient, evaluated laboratory and imaging results, formulated the assessment and plan and placed orders. The Patient requires high complexity decision making with multiple systems involvement.  Rounds were done with the Respiratory Therapy Director and Staff therapists and discussed with nursing staff also.  Yevonne Pax, MD [facility] Pulmonary Critical Care Medicine Sleep Medicine

## 2020-03-22 ENCOUNTER — Other Ambulatory Visit (HOSPITAL_COMMUNITY): Payer: PRIVATE HEALTH INSURANCE

## 2020-03-22 LAB — BLOOD GAS, ARTERIAL
Acid-Base Excess: 10.2 mmol/L — ABNORMAL HIGH (ref 0.0–2.0)
Bicarbonate: 37 mmol/L — ABNORMAL HIGH (ref 20.0–28.0)
FIO2: 50
O2 Saturation: 95.8 %
Patient temperature: 37
pCO2 arterial: 81.7 mmHg (ref 32.0–48.0)
pH, Arterial: 7.278 — ABNORMAL LOW (ref 7.350–7.450)
pO2, Arterial: 82.1 mmHg — ABNORMAL LOW (ref 83.0–108.0)

## 2020-03-22 LAB — CBC
HCT: 29 % — ABNORMAL LOW (ref 36.0–46.0)
Hemoglobin: 8.8 g/dL — ABNORMAL LOW (ref 12.0–15.0)
MCH: 30.4 pg (ref 26.0–34.0)
MCHC: 30.3 g/dL (ref 30.0–36.0)
MCV: 100.3 fL — ABNORMAL HIGH (ref 80.0–100.0)
Platelets: 242 10*3/uL (ref 150–400)
RBC: 2.89 MIL/uL — ABNORMAL LOW (ref 3.87–5.11)
RDW: 16 % — ABNORMAL HIGH (ref 11.5–15.5)
WBC: 7.8 10*3/uL (ref 4.0–10.5)
nRBC: 0 % (ref 0.0–0.2)

## 2020-03-22 LAB — BASIC METABOLIC PANEL
Anion gap: 11 (ref 5–15)
BUN: 51 mg/dL — ABNORMAL HIGH (ref 6–20)
CO2: 36 mmol/L — ABNORMAL HIGH (ref 22–32)
Calcium: 9.4 mg/dL (ref 8.9–10.3)
Chloride: 99 mmol/L (ref 98–111)
Creatinine, Ser: 0.76 mg/dL (ref 0.44–1.00)
GFR, Estimated: >60 mL/min (ref >60)
Glucose, Bld: 232 mg/dL — ABNORMAL HIGH (ref 70–99)
Potassium: 5.2 mmol/L — ABNORMAL HIGH (ref 3.5–5.1)
Sodium: 146 mmol/L — ABNORMAL HIGH (ref 135–145)

## 2020-03-22 NOTE — Consult Note (Signed)
Chief Complaint: Patient was seen in consultation today for gastrostomy tube placement.   Referring Physician(s): Dr. Laren Everts  Supervising Physician: Sandi Mariscal  Patient Status: [facility]  History of Present Illness: Kathleen Cameron is a 54 y.o. female with a medical history significant for COPD, seizure disorder, asthma and chronic respiratory failure. 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 She is currently receiving tube feeds via a right nare cor-trak.   Interventional Radiology has been asked to evaluate this patient for an image-guided gastrostomy tube placement for long-term nutritional needs.   Past Medical History:  Diagnosis Date  . Acute on chronic respiratory failure with hypoxia ([place])   . Anxiety disorder   . Asthma   . COPD (chronic obstructive pulmonary disease) ([place])   . COPD, severe ([place])   . Hypertension   . Seizure disorder [facility])     Past Surgical History:  Procedure Laterality Date  . WISDOM TOOTH EXTRACTION      Allergies: Stiolto respimat [tiotropium bromide-olodaterol]  Medications: Prior to Admission medications   Medication Sig Start Date End Date Taking? Authorizing Provider  amLODipine (NORVASC) 5 MG tablet Place 2 tablets (10 mg total) into feeding tube daily. 03/09/20   Candee Furbish, MD  arformoterol (BROVANA) 15 MCG/2ML NEBU Take 2 mLs (15 mcg total) by nebulization 2 (two) times daily. 03/08/20   Candee Furbish, MD  aspirin 81 MG chewable tablet Place 1 tablet (81 mg total) into feeding tube daily. 03/09/20   Candee Furbish, MD  bisacodyl (DULCOLAX) 10 MG suppository Place 1 suppository (10 mg total) rectally daily as needed for severe constipation. 03/08/20   Candee Furbish, MD  budesonide (PULMICORT) 0.25 MG/2ML nebulizer solution Take 2 mLs (0.25 mg total) by nebulization 2 (two) times daily. 03/08/20   Candee Furbish, MD  Chlorhexidine Gluconate Cloth 2 % PADS Apply 6 each topically daily. 03/09/20   Candee Furbish,  MD  chlorhexidine gluconate, MEDLINE KIT, (PERIDEX) 0.12 % solution 15 mLs by Mouth Rinse route 2 (two) times daily. 03/08/20   Candee Furbish, MD  clonazePAM (KLONOPIN) 1 MG tablet Place 1 tablet (1 mg total) into feeding tube 3 (three) times daily. 03/08/20   Candee Furbish, MD  enoxaparin (LOVENOX) 40 MG/0.4ML injection Inject 0.4 mLs (40 mg total) into the skin daily. 03/09/20   Candee Furbish, MD  famotidine (PEPCID) 40 MG/5ML suspension Place 2.5 mLs (20 mg total) into feeding tube 2 (two) times daily. 03/08/20   Candee Furbish, MD  fentaNYL (SUBLIMAZE) 100 MCG/2ML injection Inject 0.5 mLs (25 mcg total) into the vein every 2 (two) hours as needed for severe pain. 03/08/20   Candee Furbish, MD  levothyroxine (SYNTHROID) 50 MCG tablet Take 1 tablet (50 mcg total) by mouth daily at 6 (six) AM. 02/03/20   Charlynne Cousins, MD  lip balm (CARMEX) ointment Apply topically as needed for lip care. 03/08/20   Candee Furbish, MD  metoCLOPramide (REGLAN) 10 MG tablet Place 1 tablet (10 mg total) into feeding tube every 8 (eight) hours. 03/08/20   Candee Furbish, MD  midazolam (VERSED) 2 MG/2ML SOLN injection Inject 2 mLs (2 mg total) into the vein every 2 (two) hours as needed for sedation. 03/08/20   Candee Furbish, MD  Nutritional Supplements (FEEDING SUPPLEMENT, OSMOLITE 1.2 CAL,) LIQD Place 1,000 mLs into feeding tube continuous. 03/08/20   Candee Furbish, MD  pantoprazole sodium (PROTONIX) 40 mg/20 mL PACK Place 20 mLs (  40 mg total) into feeding tube at bedtime. 03/08/20   Candee Furbish, MD  phenol (CHLORASEPTIC) 1.4 % LIQD Use as directed 1 spray in the mouth or throat as needed for throat irritation / pain. 03/08/20   Candee Furbish, MD  polyethylene glycol (MIRALAX / GLYCOLAX) 17 g packet Place 17 g into feeding tube daily as needed for mild constipation. 03/08/20   Candee Furbish, MD  prednisoLONE acetate (PRED FORTE) 1 % ophthalmic suspension Place 1 drop into the left eye 4  (four) times daily. 03/08/20   Candee Furbish, MD  revefenacin (YUPELRI) 175 MCG/3ML nebulizer solution Take 3 mLs (175 mcg total) by nebulization daily. 03/09/20   Candee Furbish, MD  rosuvastatin (CRESTOR) 40 MG tablet Place 1 tablet (40 mg total) into feeding tube daily. 03/08/20   Candee Furbish, MD  senna-docusate (SENOKOT-S) 8.6-50 MG tablet Place 2 tablets into feeding tube at bedtime as needed for moderate constipation. 03/08/20   Candee Furbish, MD  sodium chloride (OCEAN) 0.65 % SOLN nasal spray Place 1 spray into both nostrils as needed for congestion. 03/08/20   Candee Furbish, MD  temazepam (RESTORIL) 15 MG capsule Place 1 capsule (15 mg total) into feeding tube at bedtime. 03/08/20   Candee Furbish, MD  venlafaxine [facility]) 37.5 MG tablet Place 1 tablet (37.5 mg total) into feeding tube 2 (two) times daily with a meal. 03/08/20   Candee Furbish, MD     Family History  Family history unknown: Yes    Social History   Socioeconomic History  . Marital status: Married    Spouse name: Not on file  . Number of children: Not on file  . Years of education: Not on file  . Highest education level: Not on file  Occupational History  . Not on file  Tobacco Use  . Smoking status: Never Smoker  . Smokeless tobacco: Never Used  Vaping Use  . Vaping Use: Never used  Substance and Sexual Activity  . Alcohol use: Yes    Alcohol/week: 2.0 standard drinks    Types: 2 Cans of beer per week  . Drug use: Never  . Sexual activity: Not on file  Other Topics Concern  . Not on file  Social History Narrative  . Not on file   Social Determinants of Health   Financial Resource Strain:   . Difficulty of Paying Living Expenses: Not on file  Food Insecurity: No Food Insecurity  . Worried About Charity fundraiser in the Last Year: Never true  . Ran Out of Food in the Last Year: Never true  Transportation Needs: No Transportation Needs  . Lack of Transportation (Medical): No  .  Lack of Transportation (Non-Medical): No  Physical Activity:   . Days of Exercise per Week: Not on file  . Minutes of Exercise per Session: Not on file  Stress:   . Feeling of Stress : Not on file  Social Connections:   . Frequency of Communication with Friends and Family: Not on file  . Frequency of Social Gatherings with Friends and Family: Not on file  . Attends Religious Services: Not on file  . Active Member of Clubs or Organizations: Not on file  . Attends Archivist Meetings: Not on file  . Marital Status: Not on file    Review of Systems: A 12 point ROS discussed and pertinent positives are indicated in the HPI above.  All other systems are negative.  Review of Systems  Unable to perform ROS: Patient unresponsive    Vital Signs: HR 63, BP 134/83, RR 30, 88% O2 saturation on 35% FiO2, 5 PEEP.   Physical Exam Constitutional:      General: She is not in acute distress.    Comments: Patient with eyes slightly open, she is unresponsive to verbal commands and she remained unresponsive throughout the assessment.   HENT:     Nose:     Comments: cortrak     Mouth/Throat:     Mouth: Mucous membranes are moist.     Pharynx: Oropharynx is clear.  Cardiovascular:     Rate and Rhythm: Normal rate and regular rhythm.     Pulses: Normal pulses.     Heart sounds: Normal heart sounds.  Pulmonary:     Breath sounds: Normal breath sounds.     Comments: Tracheostomy/ventilator. Abdominal:     General: Bowel sounds are normal.     Palpations: Abdomen is soft.  Skin:    General: Skin is warm and dry.     Imaging: DG Chest 1 View  Result Date: 03/07/2020 CLINICAL DATA:  Hypoxia EXAM: CHEST  1 VIEW COMPARISON:  March 07, 2020 study obtained earlier in the day FINDINGS: Tracheostomy catheter tip is 6.0 cm above the carina. Feeding tube tip is below the diaphragm. No pneumothorax. There is atelectatic change in the right base. There is ill-defined opacity in the right  base as well, stable. Left lung is clear. Heart size and pulmonary vascularity are normal. No adenopathy. No bone lesions. IMPRESSION: Ill-defined patchy opacity right base, likely pneumonia. Associated atelectasis in this area. Left lung clear. Heart size normal. Tube positions as described without pneumothorax. Electronically Signed   By: Lowella Grip III M.D.   On: 03/07/2020 13:40   DG Chest 1 View  Result Date: 03/07/2020 CLINICAL DATA:  Tracheostomy EXAM: CHEST  1 VIEW COMPARISON:  03/02/2020 FINDINGS: Tracheostomy is unchanged. Nasoenteric feeding tube with its tip overlying the proximal body of the stomach is noted. Pulmonary insufflation has improved and is now normal and symmetric. There are scattered nodular infiltrates noted within the lung bases bilaterally and asymmetrically within the mid lung zones bilaterally which are nonspecific, but may be infectious or inflammatory in nature. No pneumothorax or pleural effusion. Cardiac size within normal limits. No acute bone abnormality. IMPRESSION: Tracheostomy in expected position. Nasoenteric feeding tube tip within the proximal stomach. Improved pulmonary volumes. Development of nodular scattered infiltrates, possibly infectious or inflammatory. Electronically Signed   By: Fidela Salisbury MD   On: 03/07/2020 05:59   DG Chest 1 View  Result Date: 02/26/2020 CLINICAL DATA:  Ventilation tube blocked EXAM: CHEST  1 VIEW COMPARISON:  Portable exam 1532 hours compared to 02/25/2020 FINDINGS: Tip of endotracheal tube projects 3.4 cm above carina. Nasogastric tube extends into stomach. RIGHT jugular line tip projecting over SVC. Normal heart size, mediastinal contours, and pulmonary vascularity. Virtual osseous thoracic aorta. Minimal subsegmental atelectasis at lung bases. Lungs hyperinflated but otherwise clear. No acute infiltrate, pleural effusion, or pneumothorax. IMPRESSION: Minimal bibasilar atelectasis. Electronically Signed   By: Lavonia Dana  M.D.   On: 02/26/2020 16:05   DG Abd 1 View  Result Date: 03/22/2020 CLINICAL DATA:  Abdominal pain. EXAM: ABDOMEN - 1 VIEW COMPARISON:  Single-view of the abdomen 03/08/2020. FINDINGS: Feeding tube is in place with the tip in the stomach. Bowel-gas pattern is benign. No unexpected abdominal calcification or focal bony abnormality. IMPRESSION: No acute finding. Feeding tube  tip is in the stomach, unchanged. Electronically Signed   By: Inge Rise M.D.   On: 03/22/2020 14:56   DG Abd 1 View  Result Date: 03/07/2020 CLINICAL DATA:  Abdominal distension and abnormal bowel sounds. EXAM: ABDOMEN - 1 VIEW COMPARISON:  Multiple prior studies most recent comparison chest x-ray from the same date and abdominal radiograph from March 05, 2020 FINDINGS: Feeding tube, tip in the mid stomach. Moderate to marked gastric distension. Potential placement of rectal management tube as well. Perhaps slightly less distension of the colon compared to previous studies. Diffuse small bowel distension as well, distended with gas. Lung bases are clear. No acute skeletal process on limited assessment. IMPRESSION: 1. Feeding tube tip in the mid stomach. 2. Moderate to marked gastric distension. 3. Persistent diffuse gaseous distension of both large and small bowel may reflect global ileus. Perhaps slightly less distension of the colon when compared to the limited abdominal radiograph from the 15th of November. 4. Potential placement of rectal management tube projecting over the pelvis. Electronically Signed   By: Zetta Bills M.D.   On: 03/07/2020 12:06   DG Abd 1 View  Result Date: 03/03/2020 CLINICAL DATA:  Enteric tube placement EXAM: ABDOMEN - 1 VIEW COMPARISON:  Same day radiograph, November 9th 2021 FINDINGS: Magdalene Molly is not visualized. Enteric tube side port and tip project over the expected region of the stomach. Mild diffuse gaseous distension of bowel without frank evidence of obstruction. Mild degenerative  changes of the lumbar spine. IMPRESSION: Enteric tube tip and side port project over the expected region of the stomach. However the carina is not visualized on provided radiographs. Consider chest radiograph to ensure appropriate placement prior to enteric tube utilization if clinically indicated. Electronically Signed   By: Valentino Saxon MD   On: 03/03/2020 13:56   DG Abd 1 View  Result Date: 03/03/2020 CLINICAL DATA:  Abdominal pain and constipation EXAM: ABDOMEN - 1 VIEW COMPARISON:  February 28, 2020 FINDINGS: Nasogastric tube no longer apparent. There is relatively mild stool in the colon. There is no bowel dilatation or air-fluid level to suggest bowel obstruction. No free air. No abnormal calcifications. Visualized lung bases clear. IMPRESSION: Nasogastric tube no longer apparent. No bowel obstruction or free air. Fairly mild stool volume in colon. Lung bases clear. Electronically Signed   By: Lowella Grip III M.D.   On: 03/03/2020 10:52   CT HEAD WO CONTRAST  Result Date: 03/19/2020 CLINICAL DATA:  Mental status change, unknown cause. Sudden change in mental status. EXAM: CT HEAD WITHOUT CONTRAST TECHNIQUE: Contiguous axial images were obtained from the base of the skull through the vertex without intravenous contrast. COMPARISON:  Head CT 02/28/2020. FINDINGS: Brain: Mild cerebral atrophy. Mild-to-moderate patchy hypodensity within the cerebral white matter. Redemonstrated chronic focus of gliosis within the body of the corpus callosum. Findings are nonspecific but most commonly related to chronic small vessel ischemia. There is no acute intracranial hemorrhage. No demarcated cortical infarct. No extra-axial fluid collection. No evidence of intracranial mass. No midline shift. Vascular: No hyperdense vessel.  Atherosclerotic calcifications. Skull: Normal. Negative for fracture or focal lesion. Sinuses/Orbits: Visualized orbits show no acute finding. Small mucous retention cyst and mild  mucosal thickening within the right maxillary sinus. IMPRESSION: No evidence of acute intracranial abnormality. Stable mild-to-moderate cerebral white matter disease. Redemonstrated chronic focus of gliosis within the callosal. Findings are nonspecific but most commonly related to chronic small vessel ischemia. Mild cerebral atrophy. Right maxillary sinusitis. Electronically Signed   By:  Kellie Simmering DO   On: 03/19/2020 09:18   CT HEAD WO CONTRAST  Result Date: 02/28/2020 CLINICAL DATA:  54 year old female with altered mental status. Abdominal pain, hypoxia. EXAM: CT HEAD WITHOUT CONTRAST TECHNIQUE: Contiguous axial images were obtained from the base of the skull through the vertex without intravenous contrast. COMPARISON:  Head CT 01/28/2020. FINDINGS: Brain: No midline shift, mass effect, or evidence of intracranial mass lesion. No ventriculomegaly. No acute intracranial hemorrhage identified. Probable sulcal variation rather than chronic cortical encephalomalacia in the right parietal lobe (series 3, image 27). Similar finding in the left superior frontal gyrus on image 29. Mild to moderate for age patchy bilateral white matter hypodensity. Additionally, there is a stable focal area of chronic encephalomalacia in the body of the corpus callosum (sagittal image 31). Gray-white matter differentiation otherwise within normal limits. Vascular: Mild Calcified atherosclerosis at the skull base. No suspicious intracranial vascular hyperdensity. Skull: Negative. Sinuses/Orbits: Mild paranasal sinus mucosal thickening. Tympanic cavities and mastoids are clear. Patient is intubated. Other: Visualized orbits and scalp soft tissues are within normal limits. IMPRESSION: 1. No acute intracranial abnormality. 2. Moderate for age cerebral white matter changes, including chronic involvement of the corpus callosum. This is nonspecific but most commonly due to chronic small vessel disease. Electronically Signed   By: Genevie Ann  M.D.   On: 02/28/2020 15:08   CT CHEST ABDOMEN PELVIS W CONTRAST  Result Date: 02/28/2020 CLINICAL DATA:  Abdominal pain.  Altered mental status EXAM: CT CHEST, ABDOMEN, AND PELVIS WITH CONTRAST TECHNIQUE: Multidetector CT imaging of the chest, abdomen and pelvis was performed following the standard protocol during bolus administration of intravenous contrast. CONTRAST:  12m OMNIPAQUE IOHEXOL 300 MG/ML  SOLN COMPARISON:  None. FINDINGS: CT CHEST FINDINGS Cardiovascular: No significant vascular findings. Normal heart size. No pericardial effusion. Mediastinum/Nodes: Multiple calcified mediastinal hilar lymph nodes. Intubated patient with endotracheal tube approximately 2 cm from carina. Feeding tube extends the esophagus. Lungs/Pleura: No pneumothorax. No airspace disease. Multiple large pleural nodules are present primarily in the upper lobes. Example nodule in the RIGHT measures 11 mm with a stalk like attachment to the pleural surface (image 53/series 4. Similar nodule in the RIGHT lung apex measures 5 mm on image 30. The 3 LEFT apical nodules measuring less than 5 mm on image 30/4. Musculoskeletal: No aggressive osseous lesion. CT ABDOMEN AND PELVIS FINDINGS Hepatobiliary: No focal hepatic lesion. Gallbladder normal. No biliary duct dilatation. Pancreas: Pancreas is normal. No ductal dilatation. No pancreatic inflammation. Spleen: Normal spleen Adrenals/urinary tract: Adrenal glands normal. Kidneys enhance symmetrically. Renal obstruction. Ureters normal. Foley catheter within the bladder. Stomach/Bowel: NG tube extends the stomach. Duodenum small bowel normal. No evidence of bowel obstruction or inflammation. Cecum is normal. Ascending, transverse and descending colon normal. No evidence of bowel obstruction. Moderate volume stool of stool Vascular/Lymphatic: Abdominal aorta normal caliber. Celiac trunk and SMA are patent. No lymphadenopathy. Reproductive: . uterus is normal.  No adnexal abnormality.  Other: Small volume of free fluid in the RIGHT pelvis has simple fluid attenuation Musculoskeletal: No aggressive osseous lesion. IMPRESSION: Chest Impression: 1. Intubated patient. 2. No acute cardiopulmonary findings. 3. Calcified mediastinal lymph nodes and multiple pleural base nodule. Favor chronic pulmonary findings. Abdomen / Pelvis Impression: 1. No evidence of bowel obstruction or inflammation. 2. Small amount free fluid the pelvis. 3. Normal pancreas. 4. No renal obstruction. Electronically Signed   By: SSuzy BouchardM.D.   On: 02/28/2020 16:07   DG CHEST PORT 1 VIEW  Result Date:  03/20/2020 CLINICAL DATA:  Dyspnea EXAM: PORTABLE CHEST 1 VIEW COMPARISON:  03/18/2020 FINDINGS: Tracheostomy and nasoenteric feeding tube with its tip just within the stomach beyond the gastroesophageal junction are unchanged. The lungs are symmetrically hyperinflated in keeping with changes of underlying COPD. Nodules at the right apex are again identified, better evaluated on prior chest CT of 02/28/2020. No pneumothorax or pleural effusion. Cardiac size within normal limits. Pulmonary vascularity is normal. No acute bone abnormality. IMPRESSION: No active disease.  COPD. Electronically Signed   By: Fidela Salisbury MD   On: 03/20/2020 06:36   DG CHEST PORT 1 VIEW  Result Date: 03/18/2020 CLINICAL DATA:  Shortness of breath EXAM: PORTABLE CHEST 1 VIEW COMPARISON:  March 13, 2020 chest radiograph and chest CT February 28, 2020 FINDINGS: Tracheostomy catheter tip is 5.5 cm above the carina. Enteric tube tip is in the proximal stomach. There is atelectatic change in the lung bases. Equivocal pleural effusions noted bilaterally. No edema or airspace opacity. Nodular opacities seen in the right upper lobe, stable compared to recent studies. Heart size and pulmonary vascularity are within normal limits. No adenopathy. No bone lesions. IMPRESSION: Nodular opacities right upper lobe, unchanged and of uncertain etiology.  Areas of atelectatic change bilaterally. Equivocal pleural effusions bilaterally. No edema or airspace opacity. Stable cardiac silhouette. Tube positions as described without evident pneumothorax. Electronically Signed   By: Lowella Grip III M.D.   On: 03/18/2020 09:17   DG CHEST PORT 1 VIEW  Result Date: 03/13/2020 CLINICAL DATA:  Respiratory failure. EXAM: PORTABLE CHEST 1 VIEW COMPARISON:  03/08/2020.  CT 02/28/2020. FINDINGS: Tracheostomy tube feeding tube in stable position. Heart size normal. Stable right upper lobe pulmonary nodular densities again noted. These are best identified by prior CT. Nodular opacity noted the right lung base may represent a nipple shadow, follow-up exam with nipple markers suggested. Mild atelectatic changes right upper lung and right base. Tiny bilateral pleural effusions cannot be excluded. Degenerative change thoracic spine. IMPRESSION: 1. Tracheostomy tube and feeding tube in stable position. 2. Stable right upper lobe pulmonary nodular densities again noted. These are best identified by prior CT. Nodular opacity noted over the right base, possibly a nipple shadow. Follow-up exam with nipple markers suggested. 3. Mild atelectatic changes right upper lung and right base. Tiny bilateral pleural effusions cannot be excluded. Electronically Signed   By: Marcello Moores  Register   On: 03/13/2020 06:16   DG Chest Port 1 View  Result Date: 03/08/2020 CLINICAL DATA:  Respiratory failure EXAM: PORTABLE CHEST 1 VIEW COMPARISON:  March 07, 2020 FINDINGS: Tracheostomy catheter tip is 7.4 cm above the carina. Feeding tube tip in stomach. No pneumothorax. There there is mild right upper lobe and left lower lobe atelectasis. No edema or airspace opacity. Heart size and pulmonary vascular normal. Aorta is mildly tortuous, stable. No bone lesions. IMPRESSION: Tube positions as described without pneumothorax. Areas of scattered atelectatic change. No edema or airspace opacity. Stable  cardiac silhouette. Electronically Signed   By: Lowella Grip III M.D.   On: 03/08/2020 19:35   DG Chest Port 1 View  Result Date: 03/02/2020 CLINICAL DATA:  54 year old female status post tracheostomy. EXAM: PORTABLE CHEST 1 VIEW COMPARISON:  Chest radiograph dated 02/28/2020 and CT dated 02/28/2020 FINDINGS: Tracheostomy with tip approximately 6 cm above the carina. Right IJ central venous line in similar position. Background of emphysema. No focal consolidation, pleural effusion or pneumothorax. Small scattered nodular densities in the right lung as seen on the prior CT. The  cardiac silhouette is within limits. Atherosclerotic calcification of the aorta. No acute osseous pathology. IMPRESSION: 1. Tracheostomy above the carina. 2. No interval change in the appearance of the lungs. Electronically Signed   By: Anner Crete M.D.   On: 03/02/2020 15:09   DG Chest Port 1 View  Result Date: 02/28/2020 CLINICAL DATA:  Respiratory distress EXAM: PORTABLE CHEST 1 VIEW COMPARISON:  Two days ago FINDINGS: Endotracheal tube with tip between the clavicular heads and carina. The orogastric tube reaches the stomach. Right IJ line with tip at the SVC. Fine reticular opacities attributed atelectasis or scarring. No edema, effusion, or pneumothorax. Hyperinflated appearance. IMPRESSION: Stable hardware positioning and symmetric inflation. Electronically Signed   By: Monte Fantasia M.D.   On: 02/28/2020 09:16   DG CHEST PORT 1 VIEW  Result Date: 02/25/2020 CLINICAL DATA:  Central line placement. EXAM: PORTABLE CHEST 1 VIEW COMPARISON:  Chest x-ray from same day at 1104 hours. FINDINGS: New right internal jugular central venous catheter with tip in the upper SVC. Unchanged endotracheal and enteric tubes. The heart size and mediastinal contours are within normal limits. Normal pulmonary vascularity. The lungs remain hyperinflated with emphysematous changes and scattered linear scarring. No focal consolidation,  pleural effusion, or pneumothorax. No acute osseous abnormality. IMPRESSION: 1. New right internal jugular central venous catheter with tip in the upper SVC. No complicating feature. 2. COPD. Electronically Signed   By: Titus Dubin M.D.   On: 02/25/2020 17:03   DG Chest Port 1 View  Result Date: 02/25/2020 CLINICAL DATA:  Respiratory failure, intubation EXAM: PORTABLE CHEST 1 VIEW COMPARISON:  Portable exam 1104 hours compared to 02/01/2020 FINDINGS: Tip of endotracheal tube projects 4.6 cm above carina. Nasogastric tube extends into stomach. Normal heart size, mediastinal contours, and pulmonary vascularity. Atherosclerotic calcification and tortuosity of thoracic aorta. Emphysematous changes with decreased subsegmental atelectasis in the lower lungs bilaterally. Calcified granuloma RIGHT upper lobe. Questionable subtle LEFT perihilar infiltrate. Remaining lungs clear. No pneumothorax or significant pleural effusion. IMPRESSION: COPD changes with decreased basilar atelectasis and questionable subtle LEFT perihilar infiltrate. Aortic Atherosclerosis (ICD10-I70.0). Electronically Signed   By: Lavonia Dana M.D.   On: 02/25/2020 11:44   DG Abd Portable 1V  Result Date: 03/08/2020 CLINICAL DATA:  Enteric tube placement EXAM: PORTABLE ABDOMEN - 1 VIEW COMPARISON:  None. FINDINGS: Enteric tube tip is in the mid body of the stomach. There is evidence of a hiatal hernia. There is no bowel dilatation or air-fluid level to suggest bowel obstruction. No free air. Lung bases clear. IMPRESSION: Enteric tube tip in proximal stomach. Hiatal hernia evident. No bowel obstruction or free air appreciable. Electronically Signed   By: Lowella Grip III M.D.   On: 03/08/2020 19:36   DG Abd Portable 1V  Result Date: 03/05/2020 CLINICAL DATA:  Feeding tube placement. EXAM: PORTABLE ABDOMEN - 1 VIEW COMPARISON:  03/03/2020 FINDINGS: A new feeding tube is seen with tip overlying the gastric fundus. Dilated small bowel  loops and transverse colon again noted in the visualized upper abdomen, similar to prior study. IMPRESSION: New feeding tube, with tip overlying the gastric fundus. Electronically Signed   By: Marlaine Hind M.D.   On: 03/05/2020 12:38   DG Abd Portable 1V  Result Date: 02/28/2020 CLINICAL DATA:  Abdominal pain in RIGHT upper quadrant, constipation, intubated EXAM: PORTABLE ABDOMEN - 1 VIEW COMPARISON:  Portable exam 0906 hours compared to 02/26/2020 FINDINGS: Tip of nasogastric tube projects over stomach. Slight gaseous distention of transverse colon though gas is  present to the rectum. Stool in RIGHT colon. No small bowel dilatation. No bowel wall thickening or evidence of obstruction. Lung bases hyperinflated but clear. IMPRESSION: Nonobstructive bowel gas pattern. Electronically Signed   By: Lavonia Dana M.D.   On: 02/28/2020 09:24   DG Abd Portable 1V  Result Date: 02/26/2020 CLINICAL DATA:  Abdominal pain EXAM: PORTABLE ABDOMEN - 1 VIEW COMPARISON:  Portable exam 1535 hours compared to 0826 hours FINDINGS: Nasogastric tube extends into stomach. Nonobstructive bowel gas pattern. No bowel dilatation or bowel wall thickening. Lung bases hyperinflated but clear. Osseous structures unremarkable. IMPRESSION: Nonobstructive bowel gas pattern. Electronically Signed   By: Lavonia Dana M.D.   On: 02/26/2020 16:03   DG Abd Portable 1V  Result Date: 02/26/2020 CLINICAL DATA:  Abdominal pain, onychogryphosis EXAM: PORTABLE ABDOMEN - 1 VIEW COMPARISON:  02/01/2020 abdominal radiograph FINDINGS: Enteric tube terminates in the body of the stomach. No dilated small bowel loops. Moderate colorectal stool and gas. No evidence of pneumatosis or pneumoperitoneum. No radiopaque nephrolithiasis. Mild scarring at the lung bases. Emphysema. IMPRESSION: Enteric tube terminates in the body of the stomach. Nonobstructive bowel gas pattern. Moderate colorectal stool and gas. Electronically Signed   By: Ilona Sorrel M.D.   On:  02/26/2020 09:11   EEG adult  Result Date: 02/27/2020 Lora Havens, MD     02/27/2020  4:14 PM Patient Name: Karlene Southard MRN: [id number] Epilepsy Attending: Lora Havens Referring Physician/Provider: Dr. Audria Nine Date: 02/27/2020 Duration: 28.23 mins Patient history: 54 year old female with seizure-like episode.  EEG evaluate for seizures. Level of alertness: Awake, asleep AEDs during EEG study: Versed Technical aspects: This EEG study was done with scalp electrodes positioned according to the 10-20 International system of electrode placement. Electrical activity was acquired at a sampling rate of _0  and reviewed with a high frequency filter of _1  and a low frequency filter of _2 . EEG data were recorded continuously and digitally stored. Description: No clear posterior dominant rhythm was seen.  Sleep was characterized by vertex waves, sleep spindles (12 to 14 Hz), maximal frontocentral region.  EEG showed continuous generalized 3 to 5 Hz theta-delta slowing.  Hyperventilation and photic stimulation were not performed.   ABNORMALITY -Continuous slow, generalized IMPRESSION: This study is suggestive of severe diffuse encephalopathy, nonspecific to etiology.  No seizures or epileptiform discharges were seen throughout the recording. Lora Havens   ECHOCARDIOGRAM COMPLETE  Result Date: 02/26/2020    ECHOCARDIOGRAM REPORT   Patient Name:   AUDRIS SPEAKER Date of Exam: 02/26/2020 Medical Rec #:  [id number]           Height:       64.0 in Accession #:    [id number]          Weight:       112.4 lb Date of Birth:  1966-02-16            BSA:          1.532 m Patient Age:    7 years            BP:           105/92 mmHg Patient Gender: F                   HR:           97 bpm. Exam Location:  Inpatient Procedure: 2D Echo, Cardiac Doppler, Color Doppler and Intracardiac            Opacification Agent  STAT ECHO Indications:    Dyspnea 786.09 / R06.00  History:        Patient has prior  history of Echocardiogram examinations, most                 recent 04/14/2019. COPD; Risk Factors:Hypertension, Dyslipidemia                 and Non-Smoker.  Sonographer:    Vickie Epley RDCS Referring Phys: [id number] [facility] CHAND  Sonographer Comments: Echo performed with patient supine and on artificial respirator. Image acquisition challenging due to patient body habitus. IMPRESSIONS  1. Left ventricular ejection fraction, by estimation, is 70 to 75%. The left ventricle has hyperdynamic function. The left ventricle has no regional wall motion abnormalities. There is mild left ventricular hypertrophy. Left ventricular diastolic parameters are indeterminate.  2. Right ventricular systolic function is normal. The right ventricular size is normal. There is normal pulmonary artery systolic pressure.  3. A small pericardial effusion is present. The pericardial effusion is circumferential.  4. The mitral valve is normal in structure. No evidence of mitral valve regurgitation. No evidence of mitral stenosis.  5. Technically difficult Doppler study of AV, grossly no significant stenosis or regurgitation     . The aortic valve is tricuspid. There is mild calcification of the aortic valve. There is mild thickening of the aortic valve. Aortic valve regurgitation is not visualized. No aortic stenosis is present. FINDINGS  Left Ventricle: Left ventricular ejection fraction, by estimation, is 70 to 75%. The left ventricle has hyperdynamic function. The left ventricle has no regional wall motion abnormalities. Definity contrast agent was given IV to delineate the left ventricular endocardial borders. The left ventricular internal cavity size was normal in size. There is mild left ventricular hypertrophy. Left ventricular diastolic parameters are indeterminate. Right Ventricle: The right ventricular size is normal. No increase in right ventricular wall thickness. Right ventricular systolic function is normal. There is normal  pulmonary artery systolic pressure. The tricuspid regurgitant velocity is 1.99 m/s, and  with an assumed right atrial pressure of 15 mmHg, the estimated right ventricular systolic pressure is 53.9 mmHg. Left Atrium: Left atrial size was not well visualized. Right Atrium: Right atrial size was not well visualized. Pericardium: A small pericardial effusion is present. The pericardial effusion is circumferential. Mitral Valve: The mitral valve is normal in structure. No evidence of mitral valve regurgitation. No evidence of mitral valve stenosis. Tricuspid Valve: The tricuspid valve is normal in structure. Tricuspid valve regurgitation is trivial. No evidence of tricuspid stenosis. Aortic Valve: Technically difficult Doppler study of AV, grossly no significant stenosis or regurgitation. The aortic valve is tricuspid. There is mild calcification of the aortic valve. There is mild thickening of the aortic valve. There is mild aortic valve annular calcification. Aortic valve regurgitation is not visualized. No aortic stenosis is present. Pulmonic Valve: The pulmonic valve was not well visualized. Pulmonic valve regurgitation is not visualized. No evidence of pulmonic stenosis. Aorta: The aortic root is normal in size and structure. Pulmonary Artery: Indeterminant PASP, inadequate TR jet. Venous: The inferior vena cava was not well visualized. IAS/Shunts: The interatrial septum was not well visualized.  LEFT VENTRICLE PLAX 2D LVIDd:         2.90 cm LVIDs:         2.30 cm LV PW:         0.90 cm LV IVS:        0.90 cm LVOT diam:     1.90  cm LVOT Area:     2.84 cm  LV Volumes (MOD) LV vol d, MOD A4C: 38.8 ml LV vol s, MOD A4C: 11.7 ml LV SV MOD A4C:     38.8 ml RIGHT VENTRICLE RV S prime:     11.00 cm/s LEFT ATRIUM           Index LA diam:      2.10 cm 1.37 cm/m LA Vol (A4C): 5.4 ml  3.51 ml/m   AORTA Ao Root diam: 2.70 cm MITRAL VALVE               TRICUSPID VALVE MV Area (PHT): 8.25 cm    TR Peak grad:   15.8 mmHg MV  Decel Time: 92 msec     TR Vmax:        199.00 cm/s MV E velocity: 31.20 cm/s MV A velocity: 43.90 cm/s  SHUNTS MV E/A ratio:  0.71        Systemic Diam: 1.90 cm Carlyle Dolly MD Electronically signed by Carlyle Dolly MD Signature Date/Time: 02/26/2020/8:52:36 AM    Final    ECHOCARDIOGRAM LIMITED  Result Date: 03/20/2020    ECHOCARDIOGRAM LIMITED REPORT   Patient Name:   ANNTOINETTE HAEFELE Date of Exam: 03/20/2020 Medical Rec #:  [id number]           Height:       64.0 in Accession #:    [id number]          Weight:       125.7 lb Date of Birth:  04-06-1966            BSA:          1.606 m Patient Age:    64 years            BP:           103/71 mmHg Patient Gender: F                   HR:           81 bpm. Exam Location:  Inpatient Procedure: Limited Echo Indications:     Chest Pain R07.9  History:         Patient has prior history of Echocardiogram examinations, most                  recent 02/26/2020. COPD; Risk Factors:Hypertension.  Sonographer:     Mikki Santee RDCS (AE) Referring Phys:  [id number] [place] Diagnosing Phys: Dixie Dials MD  Sonographer Comments: Technically challenging study due to limited acoustic windows and echo performed with patient supine and on artificial respirator. IMPRESSIONS  1. Left ventricular ejection fraction, by estimation, is 60 to 65%. The left ventricle has normal function. The left ventricle has no regional wall motion abnormalities. There is mild concentric left ventricular hypertrophy. Left ventricular diastolic function could not be evaluated.  2. Right ventricular systolic function is normal. The right ventricular size is normal.  3. The pericardial effusion is anterior to the right ventricle.  4. The mitral valve is grossly normal.  5. The aortic valve is grossly normal. There is mild calcification of the aortic valve. FINDINGS  Left Ventricle: Left ventricular ejection fraction, by estimation, is 60 to 65%. The left ventricle has normal  function. The left ventricle has no regional wall motion abnormalities. The left ventricular internal cavity size was normal in size. There is  mild concentric left ventricular hypertrophy. Left ventricular diastolic function could not be  evaluated. Right Ventricle: The right ventricular size is normal. No increase in right ventricular wall thickness. Right ventricular systolic function is normal. Left Atrium: Left atrial size was not well visualized. Right Atrium: Right atrial size was not well visualized. Pericardium: Trivial pericardial effusion is present. The pericardial effusion is anterior to the right ventricle. Mitral Valve: The mitral valve is grossly normal. Tricuspid Valve: The tricuspid valve is grossly normal. Aortic Valve: The aortic valve is grossly normal. There is mild calcification of the aortic valve. Pulmonic Valve: The pulmonic valve was not well visualized. Aorta: The aortic root was not well visualized. Venous: The inferior vena cava was not well visualized. IAS/Shunts: The interatrial septum was not well visualized. Dixie Dials MD Electronically signed by Dixie Dials MD Signature Date/Time: 03/20/2020/3:22:23 PM    Final     Labs:  CBC: Recent Labs    03/17/20 0457 03/19/20 1023 03/20/20 0501 03/22/20 0730  WBC 9.5 15.6* 9.2 7.8  HGB 9.8* 10.3* 9.0* 8.8*  HCT 32.6* 34.9* 30.2* 29.0*  PLT 219 350 231 242    COAGS: No results for input(s): INR, APTT in the last 8760 hours.  BMP: Recent Labs    11/05/19 2101 11/05/19 2223 11/07/19 0546 11/07/19 0546 11/08/19 0300 11/08/19 0300 11/10/19 0412 01/28/20 1503 03/17/20 0457 03/19/20 1023 03/20/20 0501 03/22/20 0730  NA 135   < > 135   < > 133*   < > 134*   < > 140 139 134* 146*  K 3.6   < > 4.7   < > 4.5   < > 4.4   < > 4.7 4.3 4.7 5.2*  CL 86*   < > 86*   < > 86*   < > 85*   < > 93* 91* 92* 99  CO2 36*   < > 40*   < > 39*   < > 40*   < > 36* 40* 34* 36*  GLUCOSE 117*   < > 112*   < > 118*   < > 133*   < >  168* 144* 190* 232*  BUN 19   < > 28*   < > 35*   < > 31*   < > 34* 35* 67* 51*  CALCIUM 9.4   < > 9.5   < > 9.4   < > 9.3   < > 9.4 9.2 8.8* 9.4  CREATININE 0.71   < > 0.84   < > 0.88   < > 0.96   < > 0.70 0.62 1.22* 0.76  GFRNONAA >60   < > >60   < > >60   < > >60   < > >60 >60 52* >60  GFRAA >60  --  >60  --  >60  --  >60  --   --   --   --   --    < > = values in this interval not displayed.    LIVER FUNCTION TESTS: Recent Labs    03/02/20 0421 03/03/20 0500 03/07/20 1432 03/09/20 0336  BILITOT 0.7 0.8 0.5 0.5  AST _0 42*  ALT _1 33  ALKPHOS 40 42 41 37*  PROT 5.8* 5.9* 5.8* 5.3*  ALBUMIN 2.9* 3.0* 2.8* 2.6*    TUMOR MARKERS: No results for input(s): AFPTM, CEA, CA199, CHROMGRNA in the last 8760 hours.  Assessment and Plan:  Tracheostomy and ventilator dependent; dysphagia: [name], 54 year old female, is tentatively scheduled for gastrostomy tube placement in IR  03/23/20. CT imaging obtained 02/28/20 shows questionable anatomy for this procedure. The patient will receive 1 cup of thin liquid barium via her current feeding tube this evening and an abdominal x-ray will be done the morning of 03/23/20. If this imaging shows a safe percutaneous window for gastrostomy tube placement the patient will come to IR for the procedure.   Risks and benefits image guided gastrostomy tube placement was discussed with the patient's husband including, but not limited to the need for a barium enema during the procedure, bleeding, infection, peritonitis and/or damage to adjacent structures.  All of the patient's husband's questions were answered and he is agreeable to proceed.  The bedside RN at [facility] Katharine Look) was given the barium and the instructions for administration. She was also instructed to please hold tube feeds at midnight and also hold any further lovenox injections. AM labs have been ordered.   Consent signed and in IR.  Thank you for this interesting  consult.  I greatly enjoyed meeting Tanasha Menees and look forward to participating in their care.  A copy of this report was sent to the requesting provider on this date.  Electronically Signed: Soyla Dryer, AGACNP-BC [phone] 03/22/2020, 4:26 PM   I spent a total of 20 Minutes    in face to face in clinical consultation, greater than 50% of which was counseling/coordinating care for image-guided gastrostomy tube placement.

## 2020-03-22 NOTE — Progress Notes (Signed)
Pulmonary Critical Care Medicine [facility] [place]   PULMONARY CRITICAL CARE SERVICE  PROGRESS NOTE  Date of Service: 03/22/2020  Kathleen Cameron  WER:[id number]  DOB: 24-Jul-1965   DOA: 03/08/2020  Referring Physician: Carron Curie, MD  HPI: Kathleen Cameron is a 54 y.o. female seen for follow up of Acute on Chronic Respiratory Failure.  Currently is on full support on pressure control right now requiring 30% FiO2.  Medications: Reviewed on Rounds  Physical Exam:  Vitals: Temperature is 98.5 pulse 70 respiratory rate is 30 blood pressure is 142/85 saturations 100%  Ventilator Settings on pressure assist control FiO2 30% respiratory pressure of 30 PEEP 5  . General: Comfortable at this time . Eyes: Grossly normal lids, irises & conjunctiva . ENT: grossly tongue is normal . Neck: no obvious mass . Cardiovascular: S1 S2 normal no gallop . Respiratory: No rhonchi coarse breath sounds . Abdomen: soft . Skin: no rash seen on limited exam . Musculoskeletal: not rigid . Psychiatric:unable to assess . Neurologic: no seizure no involuntary movements         Lab Data:   Basic Metabolic Panel: Recent Labs  Lab 03/17/20 0457 03/19/20 1023 03/20/20 0501 03/22/20 0730  NA 140 139 134* 146*  K 4.7 4.3 4.7 5.2*  CL 93* 91* 92* 99  CO2 36* 40* 34* 36*  GLUCOSE 168* 144* 190* 232*  BUN 34* 35* 67* 51*  CREATININE 0.70 0.62 1.22* 0.76  CALCIUM 9.4 9.2 8.8* 9.4  MG 2.3  --   --   --     ABG: Recent Labs  Lab 03/19/20 1040 03/19/20 1425 03/19/20 1730 03/20/20 0615 03/21/20 1020  PHART 7.296* 7.287* 7.340* 7.311* 7.338*  PCO2ART 92.7* 87.9* 76.5* 72.4* 70.7*  PO2ART 65.6* 100 117* 107 59.8*  HCO3 43.9* 40.6* 39.8* 35.6* 37.2*  O2SAT 90.3 97.2 98.3 98.0 90.4    Liver Function Tests: No results for input(s): AST, ALT, ALKPHOS, BILITOT, PROT, ALBUMIN in the last 168 hours. No results for input(s): LIPASE, AMYLASE in the last 168 hours. No results for  input(s): AMMONIA in the last 168 hours.  CBC: Recent Labs  Lab 03/17/20 0457 03/19/20 1023 03/20/20 0501 03/22/20 0730  WBC 9.5 15.6* 9.2 7.8  HGB 9.8* 10.3* 9.0* 8.8*  HCT 32.6* 34.9* 30.2* 29.0*  MCV 97.9 98.0 98.1 100.3*  PLT 219 350 231 242    Cardiac Enzymes: Recent Labs  Lab 03/18/20 2011 03/19/20 0024 03/19/20 0348  CKTOTAL 54 72 64  CKMB 4.5 6.1* 6.3*    BNP (last 3 results) Recent Labs    09/06/19 1414 01/28/20 1503 02/25/20 1115  BNP 32.2 106.8* 282.7*    ProBNP (last 3 results) No results for input(s): PROBNP in the last 8760 hours.  Radiological Exams: ECHOCARDIOGRAM LIMITED  Result Date: 03/20/2020    ECHOCARDIOGRAM LIMITED REPORT   Patient Name:   Kathleen Cameron Date of Exam: 03/20/2020 Medical Rec #:  [id number]           Height:       64.0 in Accession #:    [id number]          Weight:       125.7 lb Date of Birth:  November 19, 1965            BSA:          1.606 m Patient Age:    54 years            BP:  103/71 mmHg Patient Gender: F                   HR:           81 bpm. Exam Location:  Inpatient Procedure: Limited Echo Indications:     Chest Pain R07.9  History:         Patient has prior history of Echocardiogram examinations, most                  recent 02/26/2020. COPD; Risk Factors:Hypertension.  Sonographer:     Thurman Coyer RDCS (AE) Referring Phys:  [id number] Florian Buff BROWN Diagnosing Phys: Orpah Cobb MD  Sonographer Comments: Technically challenging study due to limited acoustic windows and echo performed with patient supine and on artificial respirator. IMPRESSIONS  1. Left ventricular ejection fraction, by estimation, is 60 to 65%. The left ventricle has normal function. The left ventricle has no regional wall motion abnormalities. There is mild concentric left ventricular hypertrophy. Left ventricular diastolic function could not be evaluated.  2. Right ventricular systolic function is normal. The right ventricular size is  normal.  3. The pericardial effusion is anterior to the right ventricle.  4. The mitral valve is grossly normal.  5. The aortic valve is grossly normal. There is mild calcification of the aortic valve. FINDINGS  Left Ventricle: Left ventricular ejection fraction, by estimation, is 60 to 65%. The left ventricle has normal function. The left ventricle has no regional wall motion abnormalities. The left ventricular internal cavity size was normal in size. There is  mild concentric left ventricular hypertrophy. Left ventricular diastolic function could not be evaluated. Right Ventricle: The right ventricular size is normal. No increase in right ventricular wall thickness. Right ventricular systolic function is normal. Left Atrium: Left atrial size was not well visualized. Right Atrium: Right atrial size was not well visualized. Pericardium: Trivial pericardial effusion is present. The pericardial effusion is anterior to the right ventricle. Mitral Valve: The mitral valve is grossly normal. Tricuspid Valve: The tricuspid valve is grossly normal. Aortic Valve: The aortic valve is grossly normal. There is mild calcification of the aortic valve. Pulmonic Valve: The pulmonic valve was not well visualized. Aorta: The aortic root was not well visualized. Venous: The inferior vena cava was not well visualized. IAS/Shunts: The interatrial septum was not well visualized. Orpah Cobb MD Electronically signed by Orpah Cobb MD Signature Date/Time: 03/20/2020/3:22:23 PM    Final     Assessment/Plan Active Problems:   Acute on chronic respiratory failure with hypoxia (HCC)   Seizure disorder (HCC)   COPD, severe (HCC)   Anxiety disorder   1. Acute on chronic respiratory failure hypoxia we will continue with pressure control mode titrate oxygen continue pulmonary toilet. 2. Seizure disorder no active seizure noted 3. Severe COPD medical management. 4. Anxiety disorder no change right now we will continue with present  management   I have personally seen and evaluated the patient, evaluated laboratory and imaging results, formulated the assessment and plan and placed orders. The Patient requires high complexity decision making with multiple systems involvement.  Rounds were done with the Respiratory Therapy Director and Staff therapists and discussed with nursing staff also.  Yevonne Pax, MD [facility] Pulmonary Critical Care Medicine Sleep Medicine

## 2020-03-23 ENCOUNTER — Other Ambulatory Visit (HOSPITAL_COMMUNITY): Payer: PRIVATE HEALTH INSURANCE

## 2020-03-23 HISTORY — PX: IR FLUORO RM 30-60 MIN: IMG2384

## 2020-03-23 LAB — BLOOD GAS, ARTERIAL
Acid-Base Excess: 15.8 mmol/L — ABNORMAL HIGH (ref 0.0–2.0)
Bicarbonate: 41.5 mmol/L — ABNORMAL HIGH (ref 20.0–28.0)
FIO2: 40
O2 Saturation: 97.5 %
Patient temperature: 36.1
pCO2 arterial: 66.5 mmHg (ref 32.0–48.0)
pH, Arterial: 7.407 (ref 7.350–7.450)
pO2, Arterial: 83.9 mmHg (ref 83.0–108.0)

## 2020-03-23 LAB — CBC
HCT: 27.1 % — ABNORMAL LOW (ref 36.0–46.0)
Hemoglobin: 7.8 g/dL — ABNORMAL LOW (ref 12.0–15.0)
MCH: 29.2 pg (ref 26.0–34.0)
MCHC: 28.8 g/dL — ABNORMAL LOW (ref 30.0–36.0)
MCV: 101.5 fL — ABNORMAL HIGH (ref 80.0–100.0)
Platelets: 187 10*3/uL (ref 150–400)
RBC: 2.67 MIL/uL — ABNORMAL LOW (ref 3.87–5.11)
RDW: 15.6 % — ABNORMAL HIGH (ref 11.5–15.5)
WBC: 5.8 10*3/uL (ref 4.0–10.5)
nRBC: 0 % (ref 0.0–0.2)

## 2020-03-23 LAB — POTASSIUM: Potassium: 4 mmol/L (ref 3.5–5.1)

## 2020-03-23 LAB — BASIC METABOLIC PANEL
Anion gap: 14 (ref 5–15)
BUN: 44 mg/dL — ABNORMAL HIGH (ref 6–20)
CO2: 34 mmol/L — ABNORMAL HIGH (ref 22–32)
Calcium: 8.7 mg/dL — ABNORMAL LOW (ref 8.9–10.3)
Chloride: 96 mmol/L — ABNORMAL LOW (ref 98–111)
Creatinine, Ser: 0.75 mg/dL (ref 0.44–1.00)
GFR, Estimated: >60 mL/min (ref >60)
Glucose, Bld: 125 mg/dL — ABNORMAL HIGH (ref 70–99)
Potassium: 5.3 mmol/L — ABNORMAL HIGH (ref 3.5–5.1)
Sodium: 144 mmol/L (ref 135–145)

## 2020-03-23 LAB — PROTIME-INR
INR: 1.2 (ref 0.8–1.2)
Prothrombin Time: 14.4 seconds (ref 11.4–15.2)

## 2020-03-23 MED ORDER — MIDAZOLAM HCL 2 MG/2ML IJ SOLN
INTRAMUSCULAR | Status: AC
  Filled 2020-03-23: qty 2

## 2020-03-23 MED ORDER — FENTANYL CITRATE (PF) 100 MCG/2ML IJ SOLN
INTRAMUSCULAR | Status: AC
  Filled 2020-03-23: qty 2

## 2020-03-23 MED ORDER — GLUCAGON HCL RDNA (DIAGNOSTIC) 1 MG IJ SOLR
INTRAMUSCULAR | Status: AC
  Filled 2020-03-23: qty 1

## 2020-03-23 MED ORDER — CEFAZOLIN SODIUM-DEXTROSE 2-4 GM/100ML-% IV SOLN
INTRAVENOUS | Status: AC
  Filled 2020-03-23: qty 100

## 2020-03-23 MED ORDER — CEFAZOLIN SODIUM-DEXTROSE 2-4 GM/100ML-% IV SOLN: 2.0000 g | Freq: Once | INTRAVENOUS | Status: DC

## 2020-03-23 NOTE — Progress Notes (Signed)
Interventional Radiology Procedure Note  Procedure:   Patient presents for possible percutaneous gastrostomy.   Prior upper GI study shows that the tx colon overlies the stomach.    The patient was brought to [facility] as attempt to perform gtube with insufflation of the stomach, knowing that GI/Surgery team unavailable for efficient consult at [facility].   Findings:  With insufflation the transverse colon overlies the stomach at the target site for placement.   We did not attempt placement.     Complications: None  Recommendations:  - Stable to [facility] - Recommend surgical consult     Signed,  Yvone Neu. Loreta Ave, DO

## 2020-03-23 NOTE — Sedation Documentation (Signed)
Procedure canceled per md no sedation given

## 2020-03-23 NOTE — Progress Notes (Signed)
Pulmonary Critical Care Medicine [facility] [place]   PULMONARY CRITICAL CARE SERVICE  PROGRESS NOTE  Date of Service: 03/23/2020  Kathleen Cameron  ZOX:[id number]  DOB: 10-24-1965   DOA: 03/08/2020  Referring Physician: Carron Curie, MD  HPI: Kathleen Cameron is a 54 y.o. female seen for follow up of Acute on Chronic Respiratory Failure. Patient is on full support and assist control mode currently on 40% FiO2. Seen by interventional radiology for consultation for PEG which is scheduled for today  Medications: Reviewed on Rounds  Physical Exam:  Vitals: Temperature is 98.1 pulse 80 respiratory rate 28 blood pressure is 141/78 saturations 97%  Ventilator Settings assist control FiO2 40% tidal volume is 347 PEEP 8  . General: Comfortable at this time . Eyes: Grossly normal lids, irises & conjunctiva . ENT: grossly tongue is normal . Neck: no obvious mass . Cardiovascular: S1 S2 normal no gallop . Respiratory: No rhonchi very coarse breath sounds . Abdomen: soft . Skin: no rash seen on limited exam . Musculoskeletal: not rigid . Psychiatric:unable to assess . Neurologic: no seizure no involuntary movements         Lab Data:   Basic Metabolic Panel: Recent Labs  Lab 03/17/20 0457 03/19/20 1023 03/20/20 0501 03/22/20 0730 03/23/20 0434  NA 140 139 134* 146* 144  K 4.7 4.3 4.7 5.2* 5.3*  CL 93* 91* 92* 99 96*  CO2 36* 40* 34* 36* 34*  GLUCOSE 168* 144* 190* 232* 125*  BUN 34* 35* 67* 51* 44*  CREATININE 0.70 0.62 1.22* 0.76 0.75  CALCIUM 9.4 9.2 8.8* 9.4 8.7*  MG 2.3  --   --   --   --     ABG: Recent Labs  Lab 03/19/20 1730 03/20/20 0615 03/21/20 1020 03/22/20 1820 03/23/20 0500  PHART 7.340* 7.311* 7.338* 7.278* 7.407  PCO2ART 76.5* 72.4* 70.7* 81.7* 66.5*  PO2ART 117* 107 59.8* 82.1* 83.9  HCO3 39.8* 35.6* 37.2* 37.0* 41.5*  O2SAT 98.3 98.0 90.4 95.8 97.5    Liver Function Tests: No results for input(s): AST, ALT, ALKPHOS, BILITOT,  PROT, ALBUMIN in the last 168 hours. No results for input(s): LIPASE, AMYLASE in the last 168 hours. No results for input(s): AMMONIA in the last 168 hours.  CBC: Recent Labs  Lab 03/17/20 0457 03/19/20 1023 03/20/20 0501 03/22/20 0730 03/23/20 0553  WBC 9.5 15.6* 9.2 7.8 5.8  HGB 9.8* 10.3* 9.0* 8.8* 7.8*  HCT 32.6* 34.9* 30.2* 29.0* 27.1*  MCV 97.9 98.0 98.1 100.3* 101.5*  PLT 219 350 231 242 187    Cardiac Enzymes: Recent Labs  Lab 03/18/20 2011 03/19/20 0024 03/19/20 0348  CKTOTAL 54 72 64  CKMB 4.5 6.1* 6.3*    BNP (last 3 results) Recent Labs    09/06/19 1414 01/28/20 1503 02/25/20 1115  BNP 32.2 106.8* 282.7*    ProBNP (last 3 results) No results for input(s): PROBNP in the last 8760 hours.  Radiological Exams: DG Abd 1 View  Result Date: 03/22/2020 CLINICAL DATA:  Abdominal pain. EXAM: ABDOMEN - 1 VIEW COMPARISON:  Single-view of the abdomen 03/08/2020. FINDINGS: Feeding tube is in place with the tip in the stomach. Bowel-gas pattern is benign. No unexpected abdominal calcification or focal bony abnormality. IMPRESSION: No acute finding. Feeding tube tip is in the stomach, unchanged. Electronically Signed   By: Drusilla Kanner M.D.   On: 03/22/2020 14:56   DG Chest Port 1 View  Result Date: 03/23/2020 CLINICAL DATA:  Respiratory failure. EXAM: PORTABLE CHEST 1 VIEW  COMPARISON:  03/20/2020.  03/13/2020.  CT 02/28/2020. FINDINGS: Apical lordotic view obtained. Tracheostomy tube and feeding tube in stable position. Oral contrast noted in the stomach. Mediastinum and hilar structures normal. Heart size normal. COPD. No focal alveolar infiltrate. Nodular opacities in the right upper lung again identified, better identified by prior CT. Prominent nipple shadow left base. Small bilateral pleural effusions. No pneumothorax. No acute bony abnormality identified. IMPRESSION: 1. Tracheostomy tube and feeding tube in stable position. Oral contrast noted in the stomach. 2.  COPD. No focal alveolar infiltrate. Small bilateral pleural effusions. 3. Small nodular densities again noted over the right upper lung a best identified by prior CT. Electronically Signed   By: Maisie Fus  Register   On: 03/23/2020 06:35    Assessment/Plan Active Problems:   Acute on chronic respiratory failure with hypoxia (HCC)   Seizure disorder (HCC)   COPD, severe (HCC)   Anxiety disorder   1. Acute on chronic respiratory failure with hypoxia we will continue with full support on the ventilator and assist control. Holding off on weaning patient for PEG today 2. Seizure disorder no active seizures 3. Severe COPD at baseline 4. Anxiety disorder patient is at baseline right now   I have personally seen and evaluated the patient, evaluated laboratory and imaging results, formulated the assessment and plan and placed orders. The Patient requires high complexity decision making with multiple systems involvement.  Rounds were done with the Respiratory Therapy Director and Staff therapists and discussed with nursing staff also.  Yevonne Pax, MD [facility] Pulmonary Critical Care Medicine Sleep Medicine

## 2020-03-24 LAB — BLOOD GAS, ARTERIAL
Acid-Base Excess: 12.8 mmol/L — ABNORMAL HIGH (ref 0.0–2.0)
Bicarbonate: 38.9 mmol/L — ABNORMAL HIGH (ref 20.0–28.0)
FIO2: 40
O2 Saturation: 98.3 %
Patient temperature: 36.7
pCO2 arterial: 72.7 mmHg (ref 32.0–48.0)
pH, Arterial: 7.346 — ABNORMAL LOW (ref 7.350–7.450)
pO2, Arterial: 112 mmHg — ABNORMAL HIGH (ref 83.0–108.0)

## 2020-03-24 LAB — VANCOMYCIN, TROUGH: Vancomycin Tr: 15 ug/mL (ref 15–20)

## 2020-03-24 NOTE — Progress Notes (Signed)
Pulmonary Critical Care Medicine [facility] [place]   PULMONARY CRITICAL CARE SERVICE  PROGRESS NOTE  Date of Service: 03/24/2020  Kathleen Cameron  YSA:[id number]  DOB: [birth year]   DOA: 03/08/2020  Referring Physician: Carron Curie, MD  HPI: Kathleen Cameron is a 54 y.o. female seen for follow up of Acute on Chronic Respiratory Failure.  Patient is on assist control currently requiring 40% FiO2 with full support  Medications: Reviewed on Rounds  Physical Exam:  Vitals: Temperature is 97.6 pulse 78 respiratory rate is 24 blood pressure is 110/63 saturations 100%  Ventilator Settings on assist control FiO2 is 40% tidal volume 350 PEEP 5  . General: Comfortable at this time . Eyes: Grossly normal lids, irises & conjunctiva . ENT: grossly tongue is normal . Neck: no obvious mass . Cardiovascular: S1 S2 normal no gallop . Respiratory: No rhonchi very coarse breath sound . Abdomen: soft . Skin: no rash seen on limited exam . Musculoskeletal: not rigid . Psychiatric:unable to assess . Neurologic: no seizure no involuntary movements         Lab Data:   Basic Metabolic Panel: Recent Labs  Lab 03/19/20 1023 03/20/20 0501 03/22/20 0730 03/23/20 0434 03/23/20 1904  NA 139 134* 146* 144  --   K 4.3 4.7 5.2* 5.3* 4.0  CL 91* 92* 99 96*  --   CO2 40* 34* 36* 34*  --   GLUCOSE 144* 190* 232* 125*  --   BUN 35* 67* 51* 44*  --   CREATININE 0.62 1.22* 0.76 0.75  --   CALCIUM 9.2 8.8* 9.4 8.7*  --     ABG: Recent Labs  Lab 03/19/20 1730 03/20/20 0615 03/21/20 1020 03/22/20 1820 03/23/20 0500  PHART 7.340* 7.311* 7.338* 7.278* 7.407  PCO2ART 76.5* 72.4* 70.7* 81.7* 66.5*  PO2ART 117* 107 59.8* 82.1* 83.9  HCO3 39.8* 35.6* 37.2* 37.0* 41.5*  O2SAT 98.3 98.0 90.4 95.8 97.5    Liver Function Tests: No results for input(s): AST, ALT, ALKPHOS, BILITOT, PROT, ALBUMIN in the last 168 hours. No results for input(s): LIPASE, AMYLASE in the last 168  hours. No results for input(s): AMMONIA in the last 168 hours.  CBC: Recent Labs  Lab 03/19/20 1023 03/20/20 0501 03/22/20 0730 03/23/20 0553  WBC 15.6* 9.2 7.8 5.8  HGB 10.3* 9.0* 8.8* 7.8*  HCT 34.9* 30.2* 29.0* 27.1*  MCV 98.0 98.1 100.3* 101.5*  PLT 350 231 242 187    Cardiac Enzymes: Recent Labs  Lab 03/18/20 2011 03/19/20 0024 03/19/20 0348  CKTOTAL 54 72 64  CKMB 4.5 6.1* 6.3*    BNP (last 3 results) Recent Labs    09/06/19 1414 01/28/20 1503 02/25/20 1115  BNP 32.2 106.8* 282.7*    ProBNP (last 3 results) No results for input(s): PROBNP in the last 8760 hours.  Radiological Exams: DG Abd 1 View  Result Date: 03/22/2020 CLINICAL DATA:  Abdominal pain. EXAM: ABDOMEN - 1 VIEW COMPARISON:  Single-view of the abdomen 03/08/2020. FINDINGS: Feeding tube is in place with the tip in the stomach. Bowel-gas pattern is benign. No unexpected abdominal calcification or focal bony abnormality. IMPRESSION: No acute finding. Feeding tube tip is in the stomach, unchanged. Electronically Signed   By: Drusilla Kanner M.D.   On: 03/22/2020 14:56   IR Fluoro Rm 30-60 Min  Result Date: 03/23/2020 INDICATION: 54 year old female referred for possible gastrostomy tube EXAM: IR FLUORO RM 0-60 MIN MEDICATIONS: None ANESTHESIA/SEDATION: None The patient was continuously monitored during the procedure by the interventional  radiology nurse under my direct supervision. CONTRAST:  None FLUOROSCOPY TIME:  Fluoroscopy Time: 1 minutes 24 seconds (15.4 mGy). COMPLICATIONS: None PROCEDURE: Informed written consent was obtained from the patient's family after a thorough discussion of the procedural risks, benefits and alternatives. All questions were addressed. Maximal Sterile Barrier Technique was utilized including caps, mask, sterile gowns, sterile gloves, sterile drape, hand hygiene and skin antiseptic. A timeout was performed prior to the initiation of the procedure. Patient positioned supine  position on the image intensifier table. Orogastric tube was advanced to the stomach. Stomach was insufflated. Multiple projection plain film/fluoroscopic imaging was performed. We withdrew with observation of the transverse colon overlying the target point of the anterior stomach. Gastric tube was removed. Stomach was vented through the indwelling feeding tube. Patient tolerated the procedure well and remained hemodynamically stable throughout. No complications were encountered and no significant blood loss encountered. IMPRESSION: Attempt of percutaneous gastrostomy was abandoned after stomach insufflation, with initial imaging revealing that the colon overlies the target point of the stomach. Signed, Yvone Neu. Reyne Dumas, RPVI [facility] Radiology Electronically Signed   By: Gilmer Mor D.O.   On: 03/23/2020 15:08   DG Chest Port 1 View  Result Date: 03/23/2020 CLINICAL DATA:  Respiratory failure. EXAM: PORTABLE CHEST 1 VIEW COMPARISON:  03/20/2020.  03/13/2020.  CT 02/28/2020. FINDINGS: Apical lordotic view obtained. Tracheostomy tube and feeding tube in stable position. Oral contrast noted in the stomach. Mediastinum and hilar structures normal. Heart size normal. COPD. No focal alveolar infiltrate. Nodular opacities in the right upper lung again identified, better identified by prior CT. Prominent nipple shadow left base. Small bilateral pleural effusions. No pneumothorax. No acute bony abnormality identified. IMPRESSION: 1. Tracheostomy tube and feeding tube in stable position. Oral contrast noted in the stomach. 2. COPD. No focal alveolar infiltrate. Small bilateral pleural effusions. 3. Small nodular densities again noted over the right upper lung a best identified by prior CT. Electronically Signed   By: Maisie Fus  Register   On: 03/23/2020 06:35   DG Abd Portable 1V  Result Date: 03/23/2020 CLINICAL DATA:  54 year old female referred for possible  gastrostomy EXAM: PORTABLE ABDOMEN - 1 VIEW COMPARISON:  CT 02/28/2020 FINDINGS: Weighted tip enteric feeding tube terminates within the distal esophagus, with redundancy above the diaphragm. Enteric contrast present within small bowel and colon. The course of the transverse colon overlies the enteric tube in the esophagus, with the splenic flexure above the left hemidiaphragm. Gas within small bowel.  No abnormal distension. IMPRESSION: Nonobstructive bowel gas pattern. Weighted tip enteric feeding tube above the diaphragm, redundant within the distal esophagus. Transverse colon projects over the distal feeding tube, as well as above the left hemidiaphragm, interposed between the anterior abdominal wall and stomach. Electronically Signed   By: Gilmer Mor D.O.   On: 03/23/2020 08:51    Assessment/Plan Active Problems:   Acute on chronic respiratory failure with hypoxia (HCC)   Seizure disorder (HCC)   COPD, severe (HCC)   Anxiety disorder   1. Acute on chronic respiratory failure hypoxia we will continue with full support on assist control titrate oxygen continue pulmonary toilet.  Respiratory therapy will assess the RSB I mechanics try to wean. 2. Seizure disorder no active seizures noted 3. Severe COPD at baseline 4. Anxiety disorder controlled   I have personally seen and evaluated the patient, evaluated laboratory and imaging results, formulated the assessment and plan and placed orders. The Patient requires high complexity decision making  with multiple systems involvement.  Rounds were done with the Respiratory Therapy Director and Staff therapists and discussed with nursing staff also.  Allyne Gee, MD [facility] Pulmonary Critical Care Medicine Sleep Medicine

## 2020-03-25 NOTE — Progress Notes (Signed)
Pulmonary Critical Care Medicine [facility] [place]   PULMONARY CRITICAL CARE SERVICE  PROGRESS NOTE  Date of Service: 03/25/2020  Jaziyah Gradel  YIF:[id number]  DOB: November 04, 1965   DOA: 03/08/2020  Referring Physician: Carron Curie, MD  HPI: Tamila Gaulin is a 54 y.o. female seen for follow up of Acute on Chronic Respiratory Failure.  She remains on the ventilator assist control mode has been on 40% FiO2 good saturations are noted.  Medications: Reviewed on Rounds  Physical Exam:  Vitals: Temperature is 97.0 pulse 80 respiratory 22 blood pressure is 168/81 saturations 90%  Ventilator Settings on assist control FiO2 40% tidal volume 350 PEEP 5  . General: Comfortable at this time . Eyes: Grossly normal lids, irises & conjunctiva . ENT: grossly tongue is normal . Neck: no obvious mass . Cardiovascular: S1 S2 normal no gallop . Respiratory: Scattered rhonchi very coarse breath sounds . Abdomen: soft . Skin: no rash seen on limited exam . Musculoskeletal: not rigid . Psychiatric:unable to assess . Neurologic: no seizure no involuntary movements         Lab Data:   Basic Metabolic Panel: Recent Labs  Lab 03/19/20 1023 03/20/20 0501 03/22/20 0730 03/23/20 0434 03/23/20 1904  NA 139 134* 146* 144  --   K 4.3 4.7 5.2* 5.3* 4.0  CL 91* 92* 99 96*  --   CO2 40* 34* 36* 34*  --   GLUCOSE 144* 190* 232* 125*  --   BUN 35* 67* 51* 44*  --   CREATININE 0.62 1.22* 0.76 0.75  --   CALCIUM 9.2 8.8* 9.4 8.7*  --     ABG: Recent Labs  Lab 03/20/20 0615 03/21/20 1020 03/22/20 1820 03/23/20 0500 03/24/20 1053  PHART 7.311* 7.338* 7.278* 7.407 7.346*  PCO2ART 72.4* 70.7* 81.7* 66.5* 72.7*  PO2ART 107 59.8* 82.1* 83.9 112*  HCO3 35.6* 37.2* 37.0* 41.5* 38.9*  O2SAT 98.0 90.4 95.8 97.5 98.3    Liver Function Tests: No results for input(s): AST, ALT, ALKPHOS, BILITOT, PROT, ALBUMIN in the last 168 hours. No results for input(s): LIPASE, AMYLASE in  the last 168 hours. No results for input(s): AMMONIA in the last 168 hours.  CBC: Recent Labs  Lab 03/19/20 1023 03/20/20 0501 03/22/20 0730 03/23/20 0553  WBC 15.6* 9.2 7.8 5.8  HGB 10.3* 9.0* 8.8* 7.8*  HCT 34.9* 30.2* 29.0* 27.1*  MCV 98.0 98.1 100.3* 101.5*  PLT 350 231 242 187    Cardiac Enzymes: Recent Labs  Lab 03/18/20 2011 03/19/20 0024 03/19/20 0348  CKTOTAL 54 72 64  CKMB 4.5 6.1* 6.3*    BNP (last 3 results) Recent Labs    09/06/19 1414 01/28/20 1503 02/25/20 1115  BNP 32.2 106.8* 282.7*    ProBNP (last 3 results) No results for input(s): PROBNP in the last 8760 hours.  Radiological Exams: IR Fluoro Rm 30-60 Min  Result Date: 03/23/2020 INDICATION: 54 year old female referred for possible gastrostomy tube EXAM: IR FLUORO RM 0-60 MIN MEDICATIONS: None ANESTHESIA/SEDATION: None The patient was continuously monitored during the procedure by the interventional radiology nurse under my direct supervision. CONTRAST:  None FLUOROSCOPY TIME:  Fluoroscopy Time: 1 minutes 24 seconds (15.4 mGy). COMPLICATIONS: None PROCEDURE: Informed written consent was obtained from the patient's family after a thorough discussion of the procedural risks, benefits and alternatives. All questions were addressed. Maximal Sterile Barrier Technique was utilized including caps, mask, sterile gowns, sterile gloves, sterile drape, hand hygiene and skin antiseptic. A timeout was performed prior to the initiation  of the procedure. Patient positioned supine position on the image intensifier table. Orogastric tube was advanced to the stomach. Stomach was insufflated. Multiple projection plain film/fluoroscopic imaging was performed. We withdrew with observation of the transverse colon overlying the target point of the anterior stomach. Gastric tube was removed. Stomach was vented through the indwelling feeding tube. Patient tolerated the procedure well and remained hemodynamically stable throughout.  No complications were encountered and no significant blood loss encountered. IMPRESSION: Attempt of percutaneous gastrostomy was abandoned after stomach insufflation, with initial imaging revealing that the colon overlies the target point of the stomach. Signed, Yvone Neu. Reyne Dumas, RPVI [facility] Radiology Electronically Signed   By: Gilmer Mor D.O.   On: 03/23/2020 15:08    Assessment/Plan Active Problems:   Acute on chronic respiratory failure with hypoxia (HCC)   Seizure disorder (HCC)   COPD, severe (HCC)   Anxiety disorder   1. Acute on chronic respiratory failure hypoxia we will continue with assist control currently on 40% FiO2 will continue pulmonary toilet supportive care 2. Seizure disorder no active seizures noted at this time 3. Severe COPD we will continue present management 4. Anxiety disorder no change   I have personally seen and evaluated the patient, evaluated laboratory and imaging results, formulated the assessment and plan and placed orders. The Patient requires high complexity decision making with multiple systems involvement.  Rounds were done with the Respiratory Therapy Director and Staff therapists and discussed with nursing staff also.  Yevonne Pax, MD [facility] Pulmonary Critical Care Medicine Sleep Medicine

## 2020-03-26 ENCOUNTER — Other Ambulatory Visit (HOSPITAL_COMMUNITY): Payer: PRIVATE HEALTH INSURANCE

## 2020-03-26 ENCOUNTER — Ambulatory Visit (HOSPITAL_COMMUNITY): Payer: Self-pay

## 2020-03-26 LAB — BASIC METABOLIC PANEL
Anion gap: 9 (ref 5–15)
BUN: 42 mg/dL — ABNORMAL HIGH (ref 6–20)
CO2: 36 mmol/L — ABNORMAL HIGH (ref 22–32)
Calcium: 9.3 mg/dL (ref 8.9–10.3)
Chloride: 100 mmol/L (ref 98–111)
Creatinine, Ser: 0.62 mg/dL (ref 0.44–1.00)
GFR, Estimated: >60 mL/min (ref >60)
Glucose, Bld: 155 mg/dL — ABNORMAL HIGH (ref 70–99)
Potassium: 4.3 mmol/L (ref 3.5–5.1)
Sodium: 145 mmol/L (ref 135–145)

## 2020-03-26 LAB — MAGNESIUM: Magnesium: 2.3 mg/dL (ref 1.7–2.4)

## 2020-03-26 NOTE — Progress Notes (Signed)
Pulmonary Critical Care Medicine [facility] [place]   PULMONARY CRITICAL CARE SERVICE  PROGRESS NOTE  Date of Service: 03/26/2020  Kathleen Cameron  SWF:[id number]  DOB: [birth year]   DOA: 03/08/2020  Referring Physician: Carron Curie, MD  HPI: Kathleen Cameron is a 53 y.o. female seen for follow up of Acute on Chronic Respiratory Failure.  Patient is currently on assist control has been on 40% FiO2 respiratory rate  Medications: Reviewed on Rounds  Physical Exam:  Vitals: Temperature is 96.6 pulse 60 respiratory rate is 28 blood pressure is 121/75 saturations 99%  Ventilator Settings on assist control FiO2 is 40% tidal volume 350  . General: Comfortable at this time . Eyes: Grossly normal lids, irises & conjunctiva . ENT: grossly tongue is normal . Neck: no obvious mass . Cardiovascular: S1 S2 normal no gallop . Respiratory: No rhonchi very coarse breath sounds are noted . Abdomen: soft . Skin: no rash seen on limited exam . Musculoskeletal: not rigid . Psychiatric:unable to assess . Neurologic: no seizure no involuntary movements         Lab Data:   Basic Metabolic Panel: Recent Labs  Lab 03/19/20 1023 03/20/20 0501 03/22/20 0730 03/23/20 0434 03/23/20 1904  NA 139 134* 146* 144  --   K 4.3 4.7 5.2* 5.3* 4.0  CL 91* 92* 99 96*  --   CO2 40* 34* 36* 34*  --   GLUCOSE 144* 190* 232* 125*  --   BUN 35* 67* 51* 44*  --   CREATININE 0.62 1.22* 0.76 0.75  --   CALCIUM 9.2 8.8* 9.4 8.7*  --     ABG: Recent Labs  Lab 03/20/20 0615 03/21/20 1020 03/22/20 1820 03/23/20 0500 03/24/20 1053  PHART 7.311* 7.338* 7.278* 7.407 7.346*  PCO2ART 72.4* 70.7* 81.7* 66.5* 72.7*  PO2ART 107 59.8* 82.1* 83.9 112*  HCO3 35.6* 37.2* 37.0* 41.5* 38.9*  O2SAT 98.0 90.4 95.8 97.5 98.3    Liver Function Tests: No results for input(s): AST, ALT, ALKPHOS, BILITOT, PROT, ALBUMIN in the last 168 hours. No results for input(s): LIPASE, AMYLASE in the last 168  hours. No results for input(s): AMMONIA in the last 168 hours.  CBC: Recent Labs  Lab 03/19/20 1023 03/20/20 0501 03/22/20 0730 03/23/20 0553  WBC 15.6* 9.2 7.8 5.8  HGB 10.3* 9.0* 8.8* 7.8*  HCT 34.9* 30.2* 29.0* 27.1*  MCV 98.0 98.1 100.3* 101.5*  PLT 350 231 242 187    Cardiac Enzymes: No results for input(s): CKTOTAL, CKMB, CKMBINDEX, TROPONINI in the last 168 hours.  BNP (last 3 results) Recent Labs    09/06/19 1414 01/28/20 1503 02/25/20 1115  BNP 32.2 106.8* 282.7*    ProBNP (last 3 results) No results for input(s): PROBNP in the last 8760 hours.  Radiological Exams: No results found.  Assessment/Plan Active Problems:   Acute on chronic respiratory failure with hypoxia (HCC)   Seizure disorder (HCC)   COPD, severe (HCC)   Anxiety disorder   1. Acute on chronic respiratory failure hypoxia patient currently is on assist control mode has been on 40% FiO2 2. Seizure disorder no active seizures 3. Severe COPD at baseline 4. Anxiety disorder no change   I have personally seen and evaluated the patient, evaluated laboratory and imaging results, formulated the assessment and plan and placed orders. The Patient requires high complexity decision making with multiple systems involvement.  Rounds were done with the Respiratory Therapy Director and Staff therapists and discussed with nursing staff also.  Dee Paden A  Humphrey Rolls, MD [facility] Pulmonary Critical Care Medicine Sleep Medicine

## 2020-03-28 NOTE — Progress Notes (Signed)
Pulmonary Critical Care Medicine [facility] [place]   PULMONARY CRITICAL CARE SERVICE  PROGRESS NOTE  Date of Service: 03/28/2020  Kathleen Cameron  SJG:[id number]  DOB: 1966-01-24   DOA: 03/08/2020  Referring Physician: Carron Curie, MD  HPI: Kathleen Cameron is a 54 y.o. female seen for follow up of Acute on Chronic Respiratory Failure.  Patient at this time is comfortable without distress.  Has been on assist control mode full support at this time.  Medications: Reviewed on Rounds  Physical Exam:  Vitals: Temperature is 96.6 pulse 65 respiratory rate 26 blood pressure is 138/81/duration is 96%  Ventilator Settings patient is on assist control FiO2 is 40% tidal volume 350 PEEP of 5  . General: Comfortable at this time . Eyes: Grossly normal lids, irises & conjunctiva . ENT: grossly tongue is normal . Neck: no obvious mass . Cardiovascular: S1 S2 normal no gallop . Respiratory: No rhonchi very coarse breath sounds are noted . Abdomen: soft . Skin: no rash seen on limited exam . Musculoskeletal: not rigid . Psychiatric:unable to assess . Neurologic: no seizure no involuntary movements         Lab Data:   Basic Metabolic Panel: Recent Labs  Lab 03/22/20 0730 03/23/20 0434 03/23/20 1904 03/26/20 1628  NA 146* 144  --  145  K 5.2* 5.3* 4.0 4.3  CL 99 96*  --  100  CO2 36* 34*  --  36*  GLUCOSE 232* 125*  --  155*  BUN 51* 44*  --  42*  CREATININE 0.76 0.75  --  0.62  CALCIUM 9.4 8.7*  --  9.3  MG  --   --   --  2.3    ABG: Recent Labs  Lab 03/22/20 1820 03/23/20 0500 03/24/20 1053  PHART 7.278* 7.407 7.346*  PCO2ART 81.7* 66.5* 72.7*  PO2ART 82.1* 83.9 112*  HCO3 37.0* 41.5* 38.9*  O2SAT 95.8 97.5 98.3    Liver Function Tests: No results for input(s): AST, ALT, ALKPHOS, BILITOT, PROT, ALBUMIN in the last 168 hours. No results for input(s): LIPASE, AMYLASE in the last 168 hours. No results for input(s): AMMONIA in the last 168  hours.  CBC: Recent Labs  Lab 03/22/20 0730 03/23/20 0553  WBC 7.8 5.8  HGB 8.8* 7.8*  HCT 29.0* 27.1*  MCV 100.3* 101.5*  PLT 242 187    Cardiac Enzymes: No results for input(s): CKTOTAL, CKMB, CKMBINDEX, TROPONINI in the last 168 hours.  BNP (last 3 results) Recent Labs    09/06/19 1414 01/28/20 1503 02/25/20 1115  BNP 32.2 106.8* 282.7*    ProBNP (last 3 results) No results for input(s): PROBNP in the last 8760 hours.  Radiological Exams: No results found.  Assessment/Plan Active Problems:   Acute on chronic respiratory failure with hypoxia (HCC)   Seizure disorder (HCC)   COPD, severe (HCC)   Anxiety disorder   1. Acute on chronic respiratory failure hypoxia we will continue with assist control mode patient right now is on 40% oxygen. 2. Seizure disorder no active seizure noted at this time. 3. Severe COPD at baseline 4. Anxiety disorder we will continue to monitor patient's anxiety which is doing a little bit better   I have personally seen and evaluated the patient, evaluated laboratory and imaging results, formulated the assessment and plan and placed orders. The Patient requires high complexity decision making with multiple systems involvement.  Rounds were done with the Respiratory Therapy Director and Staff therapists and discussed with nursing staff also.  Allyne Gee, MD [facility] Pulmonary Critical Care Medicine Sleep Medicine

## 2020-03-29 LAB — BASIC METABOLIC PANEL
Anion gap: 7 (ref 5–15)
BUN: 50 mg/dL — ABNORMAL HIGH (ref 6–20)
CO2: 40 mmol/L — ABNORMAL HIGH (ref 22–32)
Calcium: 9 mg/dL (ref 8.9–10.3)
Chloride: 96 mmol/L — ABNORMAL LOW (ref 98–111)
Creatinine, Ser: 0.7 mg/dL (ref 0.44–1.00)
GFR, Estimated: >60 mL/min (ref >60)
Glucose, Bld: 196 mg/dL — ABNORMAL HIGH (ref 70–99)
Potassium: 4.3 mmol/L (ref 3.5–5.1)
Sodium: 143 mmol/L (ref 135–145)

## 2020-03-29 LAB — CBC
HCT: 27.4 % — ABNORMAL LOW (ref 36.0–46.0)
Hemoglobin: 8.3 g/dL — ABNORMAL LOW (ref 12.0–15.0)
MCH: 29.7 pg (ref 26.0–34.0)
MCHC: 30.3 g/dL (ref 30.0–36.0)
MCV: 98.2 fL (ref 80.0–100.0)
Platelets: 174 10*3/uL (ref 150–400)
RBC: 2.79 MIL/uL — ABNORMAL LOW (ref 3.87–5.11)
RDW: 15.5 % (ref 11.5–15.5)
WBC: 8.3 10*3/uL (ref 4.0–10.5)
nRBC: 0 % (ref 0.0–0.2)

## 2020-03-29 NOTE — Progress Notes (Signed)
Pulmonary Critical Care Medicine [facility] [place]   PULMONARY CRITICAL CARE SERVICE  PROGRESS NOTE  Date of Service: 03/29/2020  Kathleen Cameron  WCB:[id number]  DOB: 1966/03/07   DOA: 03/08/2020  Referring Physician: Carron Curie, MD  HPI: Kathleen Cameron is a 54 y.o. female seen for follow up of Acute on Chronic Respiratory Failure.  Patient was able to complete about 5 hours of pressure support yesterday right now is on pressure support 35% FiO2  Medications: Reviewed on Rounds  Physical Exam:  Vitals: Temperature is 97.4 pulse 81 respiratory rate is 32 blood pressure is 129/77 saturations 96%  Ventilator Settings pressure support FiO2 35% pressure 12/5  . General: Comfortable at this time . Eyes: Grossly normal lids, irises & conjunctiva . ENT: grossly tongue is normal . Neck: no obvious mass . Cardiovascular: S1 S2 normal no gallop . Respiratory: No rhonchi very coarse breath sounds . Abdomen: soft . Skin: no rash seen on limited exam . Musculoskeletal: not rigid . Psychiatric:unable to assess . Neurologic: no seizure no involuntary movements         Lab Data:   Basic Metabolic Panel: Recent Labs  Lab 03/23/20 0434 03/23/20 1904 03/26/20 1628 03/29/20 0442  NA 144  --  145 143  K 5.3* 4.0 4.3 4.3  CL 96*  --  100 96*  CO2 34*  --  36* 40*  GLUCOSE 125*  --  155* 196*  BUN 44*  --  42* 50*  CREATININE 0.75  --  0.62 0.70  CALCIUM 8.7*  --  9.3 9.0  MG  --   --  2.3  --     ABG: Recent Labs  Lab 03/22/20 1820 03/23/20 0500 03/24/20 1053  PHART 7.278* 7.407 7.346*  PCO2ART 81.7* 66.5* 72.7*  PO2ART 82.1* 83.9 112*  HCO3 37.0* 41.5* 38.9*  O2SAT 95.8 97.5 98.3    Liver Function Tests: No results for input(s): AST, ALT, ALKPHOS, BILITOT, PROT, ALBUMIN in the last 168 hours. No results for input(s): LIPASE, AMYLASE in the last 168 hours. No results for input(s): AMMONIA in the last 168 hours.  CBC: Recent Labs  Lab  03/23/20 0553 03/29/20 0442  WBC 5.8 8.3  HGB 7.8* 8.3*  HCT 27.1* 27.4*  MCV 101.5* 98.2  PLT 187 174    Cardiac Enzymes: No results for input(s): CKTOTAL, CKMB, CKMBINDEX, TROPONINI in the last 168 hours.  BNP (last 3 results) Recent Labs    09/06/19 1414 01/28/20 1503 02/25/20 1115  BNP 32.2 106.8* 282.7*    ProBNP (last 3 results) No results for input(s): PROBNP in the last 8760 hours.  Radiological Exams: No results found.  Assessment/Plan Active Problems:   Acute on chronic respiratory failure with hypoxia (HCC)   Seizure disorder (HCC)   COPD, severe (HCC)   Anxiety disorder   1. Acute on chronic respiratory failure hypoxia we will continue to wean on pressure support titrate oxygen continue pulmonary toilet. 2. Seizure disorder no active seizures noted 3. Severe COPD at baseline 4. Anxiety disorder no change we will continue to monitor   I have personally seen and evaluated the patient, evaluated laboratory and imaging results, formulated the assessment and plan and placed orders. The Patient requires high complexity decision making with multiple systems involvement.  Rounds were done with the Respiratory Therapy Director and Staff therapists and discussed with nursing staff also.  Yevonne Pax, MD [facility] Pulmonary Critical Care Medicine Sleep Medicine

## 2020-03-30 NOTE — Progress Notes (Signed)
Pulmonary Critical Care Medicine [facility] [place]   PULMONARY CRITICAL CARE SERVICE  PROGRESS NOTE  Date of Service: 03/30/2020  Kathleen Cameron  GYF:[id number]  DOB: 03-23-1966   DOA: 03/08/2020  Referring Physician: Carron Curie, MD  HPI: Kathleen Cameron is a 54 y.o. female seen for follow up of Acute on Chronic Respiratory Failure.  Patient currently is on pressure support on 12 5 with a goal of 8 hours  Medications: Reviewed on Rounds  Physical Exam:  Vitals: Temperature is 96.4 pulse 89 respiratory 27 blood pressure 154/73 saturations 98%  Ventilator Settings on pressure support FiO2 is 35% currently pressure 12/5  . General: Comfortable at this time . Eyes: Grossly normal lids, irises & conjunctiva . ENT: grossly tongue is normal . Neck: no obvious mass . Cardiovascular: S1 S2 normal no gallop . Respiratory: No rhonchi very coarse breath sounds . Abdomen: soft . Skin: no rash seen on limited exam . Musculoskeletal: not rigid . Psychiatric:unable to assess . Neurologic: no seizure no involuntary movements         Lab Data:   Basic Metabolic Panel: Recent Labs  Lab 03/23/20 1904 03/26/20 1628 03/29/20 0442  NA  --  145 143  K 4.0 4.3 4.3  CL  --  100 96*  CO2  --  36* 40*  GLUCOSE  --  155* 196*  BUN  --  42* 50*  CREATININE  --  0.62 0.70  CALCIUM  --  9.3 9.0  MG  --  2.3  --     ABG: Recent Labs  Lab 03/24/20 1053  PHART 7.346*  PCO2ART 72.7*  PO2ART 112*  HCO3 38.9*  O2SAT 98.3    Liver Function Tests: No results for input(s): AST, ALT, ALKPHOS, BILITOT, PROT, ALBUMIN in the last 168 hours. No results for input(s): LIPASE, AMYLASE in the last 168 hours. No results for input(s): AMMONIA in the last 168 hours.  CBC: Recent Labs  Lab 03/29/20 0442  WBC 8.3  HGB 8.3*  HCT 27.4*  MCV 98.2  PLT 174    Cardiac Enzymes: No results for input(s): CKTOTAL, CKMB, CKMBINDEX, TROPONINI in the last 168 hours.  BNP  (last 3 results) Recent Labs    09/06/19 1414 01/28/20 1503 02/25/20 1115  BNP 32.2 106.8* 282.7*    ProBNP (last 3 results) No results for input(s): PROBNP in the last 8760 hours.  Radiological Exams: No results found.  Assessment/Plan Active Problems:   Acute on chronic respiratory failure with hypoxia (HCC)   Seizure disorder (HCC)   COPD, severe (HCC)   Anxiety disorder   1. Acute on chronic respiratory failure hypoxia on weaning protocol which will be continued to advance 2. Seizure disorder no active seizures 3. Severe COPD medical management 4. Anxiety disorder no change   I have personally seen and evaluated the patient, evaluated laboratory and imaging results, formulated the assessment and plan and placed orders. The Patient requires high complexity decision making with multiple systems involvement.  Rounds were done with the Respiratory Therapy Director and Staff therapists and discussed with nursing staff also.  Yevonne Pax, MD [facility] Pulmonary Critical Care Medicine Sleep Medicine

## 2020-03-31 ENCOUNTER — Other Ambulatory Visit (HOSPITAL_COMMUNITY): Payer: PRIVATE HEALTH INSURANCE

## 2020-03-31 NOTE — Progress Notes (Signed)
Pulmonary Critical Care Medicine [facility] [place]   PULMONARY CRITICAL CARE SERVICE  PROGRESS NOTE  Date of Service: 03/31/2020  Kathleen Cameron  KVQ:[id number]  DOB: Apr 07, 1966   DOA: 03/08/2020  Referring Physician: Carron Curie, MD  HPI: Kathleen Cameron is a 54 y.o. female seen for follow up of Acute on Chronic Respiratory Failure.  Patient was attempted on pressure support did not tolerate speculum assist control currently on 40% FiO2 with good saturations  Medications: Reviewed on Rounds  Physical Exam:  Vitals: Temperature is 96.8 pulse 73 respiratory rate 21 blood pressure is 134/73 saturations 98%  Ventilator Settings on assist control FiO2 is 40% tidal volume 350 PEEP 5  . General: Comfortable at this time . Eyes: Grossly normal lids, irises & conjunctiva . ENT: grossly tongue is normal . Neck: no obvious mass . Cardiovascular: S1 S2 normal no gallop . Respiratory: No rhonchi no rales noted at this time . Abdomen: soft . Skin: no rash seen on limited exam . Musculoskeletal: not rigid . Psychiatric:unable to assess . Neurologic: no seizure no involuntary movements         Lab Data:   Basic Metabolic Panel: Recent Labs  Lab 03/26/20 1628 03/29/20 0442  NA 145 143  K 4.3 4.3  CL 100 96*  CO2 36* 40*  GLUCOSE 155* 196*  BUN 42* 50*  CREATININE 0.62 0.70  CALCIUM 9.3 9.0  MG 2.3  --     ABG: Recent Labs  Lab 03/24/20 1053  PHART 7.346*  PCO2ART 72.7*  PO2ART 112*  HCO3 38.9*  O2SAT 98.3    Liver Function Tests: No results for input(s): AST, ALT, ALKPHOS, BILITOT, PROT, ALBUMIN in the last 168 hours. No results for input(s): LIPASE, AMYLASE in the last 168 hours. No results for input(s): AMMONIA in the last 168 hours.  CBC: Recent Labs  Lab 03/29/20 0442  WBC 8.3  HGB 8.3*  HCT 27.4*  MCV 98.2  PLT 174    Cardiac Enzymes: No results for input(s): CKTOTAL, CKMB, CKMBINDEX, TROPONINI in the last 168 hours.  BNP  (last 3 results) Recent Labs    09/06/19 1414 01/28/20 1503 02/25/20 1115  BNP 32.2 106.8* 282.7*    ProBNP (last 3 results) No results for input(s): PROBNP in the last 8760 hours.  Radiological Exams: No results found.  Assessment/Plan Active Problems:   Acute on chronic respiratory failure with hypoxia (HCC)   Seizure disorder (HCC)   COPD, severe (HCC)   Anxiety disorder   1. Acute on chronic respiratory failure hypoxia we will continue with assist control titrate oxygen continue pulmonary toilet. 2. Seizure disorder no active seizures noted. 3. Severe COPD at baseline 4. Anxiety controlled   I have personally seen and evaluated the patient, evaluated laboratory and imaging results, formulated the assessment and plan and placed orders. The Patient requires high complexity decision making with multiple systems involvement.  Rounds were done with the Respiratory Therapy Director and Staff therapists and discussed with nursing staff also.  Yevonne Pax, MD [facility] Pulmonary Critical Care Medicine Sleep Medicine

## 2020-04-01 LAB — CBC
HCT: 31.3 % — ABNORMAL LOW (ref 36.0–46.0)
Hemoglobin: 9.7 g/dL — ABNORMAL LOW (ref 12.0–15.0)
MCH: 31.5 pg (ref 26.0–34.0)
MCHC: 31 g/dL (ref 30.0–36.0)
MCV: 101.6 fL — ABNORMAL HIGH (ref 80.0–100.0)
Platelets: 172 10*3/uL (ref 150–400)
RBC: 3.08 MIL/uL — ABNORMAL LOW (ref 3.87–5.11)
RDW: 17.1 % — ABNORMAL HIGH (ref 11.5–15.5)
WBC: 23.4 10*3/uL — ABNORMAL HIGH (ref 4.0–10.5)
nRBC: 0.3 % — ABNORMAL HIGH (ref 0.0–0.2)

## 2020-04-01 LAB — BASIC METABOLIC PANEL
Anion gap: 9 (ref 5–15)
BUN: 46 mg/dL — ABNORMAL HIGH (ref 6–20)
CO2: 38 mmol/L — ABNORMAL HIGH (ref 22–32)
Calcium: 8.8 mg/dL — ABNORMAL LOW (ref 8.9–10.3)
Chloride: 97 mmol/L — ABNORMAL LOW (ref 98–111)
Creatinine, Ser: 0.53 mg/dL (ref 0.44–1.00)
GFR, Estimated: >60 mL/min (ref >60)
Glucose, Bld: 196 mg/dL — ABNORMAL HIGH (ref 70–99)
Potassium: 4.6 mmol/L (ref 3.5–5.1)
Sodium: 144 mmol/L (ref 135–145)

## 2020-04-01 LAB — MAGNESIUM: Magnesium: 2.1 mg/dL (ref 1.7–2.4)

## 2020-04-01 NOTE — Progress Notes (Signed)
Pulmonary Critical Care Medicine [facility] [place]   PULMONARY CRITICAL CARE SERVICE  PROGRESS NOTE  Date of Service: 04/01/2020  Kathleen Cameron  YTK:[id number]  DOB: 1965/09/01   DOA: 03/08/2020  Referring Physician: Carron Curie, MD  HPI: Kathleen Cameron is a 54 y.o. female seen for follow up of Acute on Chronic Respiratory Failure.  Patient currently is on the ventilator and full support has been on assist control mode currently on 60% FiO2  Medications: Reviewed on Rounds  Physical Exam:  Vitals: Temperature is 96.0 pulse 82 respiratory rate is 20 blood pressure is 149/94 saturations 95%  Ventilator Settings on assist control FiO2 60% tidal volume 350 with a PEEP of 5  . General: Comfortable at this time . Eyes: Grossly normal lids, irises & conjunctiva . ENT: grossly tongue is normal . Neck: no obvious mass . Cardiovascular: S1 S2 normal no gallop . Respiratory: No rhonchi very coarse breath sound . Abdomen: soft . Skin: no rash seen on limited exam . Musculoskeletal: not rigid . Psychiatric:unable to assess . Neurologic: no seizure no involuntary movements         Lab Data:   Basic Metabolic Panel: Recent Labs  Lab 03/26/20 1628 03/29/20 0442 04/01/20 0634  NA 145 143 144  K 4.3 4.3 4.6  CL 100 96* 97*  CO2 36* 40* 38*  GLUCOSE 155* 196* 196*  BUN 42* 50* 46*  CREATININE 0.62 0.70 0.53  CALCIUM 9.3 9.0 8.8*  MG 2.3  --  2.1    ABG: No results for input(s): PHART, PCO2ART, PO2ART, HCO3, O2SAT in the last 168 hours.  Liver Function Tests: No results for input(s): AST, ALT, ALKPHOS, BILITOT, PROT, ALBUMIN in the last 168 hours. No results for input(s): LIPASE, AMYLASE in the last 168 hours. No results for input(s): AMMONIA in the last 168 hours.  CBC: Recent Labs  Lab 03/29/20 0442 04/01/20 0634  WBC 8.3 23.4*  HGB 8.3* 9.7*  HCT 27.4* 31.3*  MCV 98.2 101.6*  PLT 174 172    Cardiac Enzymes: No results for input(s):  CKTOTAL, CKMB, CKMBINDEX, TROPONINI in the last 168 hours.  BNP (last 3 results) Recent Labs    09/06/19 1414 01/28/20 1503 02/25/20 1115  BNP 32.2 106.8* 282.7*    ProBNP (last 3 results) No results for input(s): PROBNP in the last 8760 hours.  Radiological Exams: DG Abd 1 View  Result Date: 03/31/2020 CLINICAL DATA:  Ileus/small bowel pattern. EXAM: ABDOMEN - 1 VIEW COMPARISON:  Radiograph 03/26/2020 FINDINGS: Feeding tube tip and side-port below the diaphragm in the stomach. High-density material typical of contrast is seen throughout the entire colon including the rectum. Decreased rectal distension from prior exam. Air-filled loops of prominent small bowel in the left abdomen, diminished from prior. There is mild gaseous gastric distension. IMPRESSION: 1. Persistent but diminishing air-filled small bowel, likely resolving ileus. No evidence of obstruction. 2. High-density material typical of contrast is seen throughout the entire colon. Decreased rectal distension from prior exam. Electronically Signed   By: Narda Rutherford M.D.   On: 03/31/2020 19:21   DG CHEST PORT 1 VIEW  Result Date: 03/31/2020 CLINICAL DATA:  Pneumonia.  Evaluate tracheostomy tube. EXAM: PORTABLE CHEST 1 VIEW COMPARISON:  03/23/2020 FINDINGS: Tracheostomy tube projects over the mid trachea. Feeding tube tip is in the proximal stomach. There is hyperinflation of the lungs compatible with COPD. Heart is mildly enlarged. Areas of scarring in the lungs bilaterally. Patchy left mid lung opacity has increased since prior  study. No effusions. No acute bony abnormality. IMPRESSION: COPD/chronic changes. Patchy opacity in the left mid lung concerning for pneumonia. Tracheostomy tube in expected position. Feeding tube tip in the proximal stomach. Electronically Signed   By: Charlett Nose M.D.   On: 03/31/2020 19:19    Assessment/Plan Active Problems:   Acute on chronic respiratory failure with hypoxia (HCC)   Seizure  disorder (HCC)   COPD, severe (HCC)   Anxiety disorder   1. Acute on chronic respiratory failure with hypoxia we will continue with full support on assist control currently on 60% FiO2. 2. Seizure disorder no active seizures 3. Severe COPD we will continue with medical management 4. Anxiety disorder patient is at baseline   I have personally seen and evaluated the patient, evaluated laboratory and imaging results, formulated the assessment and plan and placed orders. The Patient requires high complexity decision making with multiple systems involvement.  Rounds were done with the Respiratory Therapy Director and Staff therapists and discussed with nursing staff also.  Yevonne Pax, MD [facility] Pulmonary Critical Care Medicine Sleep Medicine

## 2020-04-02 NOTE — Progress Notes (Signed)
Pulmonary Critical Care Medicine [facility] [place]   PULMONARY CRITICAL CARE SERVICE  PROGRESS NOTE  Date of Service: 04/02/2020  Kathleen Cameron  [id number]  DOB: 1966-02-11   DOA: 03/08/2020  Referring Physician: Carron Curie, MD  HPI: Kathleen Cameron is a 54 y.o. female seen for follow up of Acute on Chronic Respiratory Failure.  Patient currently is on assist control mode on 50% FiO2 good saturations are noted.  Medications: Reviewed on Rounds  Physical Exam:  Vitals: Temperature is 96.7 pulse 88 respiratory 21 blood pressure is 129/79 saturations 99%  Ventilator Settings on assist control FiO2 50% tidal volume is 350 with a PEEP of 5  . General: Comfortable at this time . Eyes: Grossly normal lids, irises & conjunctiva . ENT: grossly tongue is normal . Neck: no obvious mass . Cardiovascular: S1 S2 normal no gallop . Respiratory: No rhonchi no rales are noted at this time . Abdomen: soft . Skin: no rash seen on limited exam . Musculoskeletal: not rigid . Psychiatric:unable to assess . Neurologic: no seizure no involuntary movements         Lab Data:   Basic Metabolic Panel: Recent Labs  Lab 03/26/20 1628 03/29/20 0442 04/01/20 0634  NA 145 143 144  K 4.3 4.3 4.6  CL 100 96* 97*  CO2 36* 40* 38*  GLUCOSE 155* 196* 196*  BUN 42* 50* 46*  CREATININE 0.62 0.70 0.53  CALCIUM 9.3 9.0 8.8*  MG 2.3  --  2.1    ABG: No results for input(s): PHART, PCO2ART, PO2ART, HCO3, O2SAT in the last 168 hours.  Liver Function Tests: No results for input(s): AST, ALT, ALKPHOS, BILITOT, PROT, ALBUMIN in the last 168 hours. No results for input(s): LIPASE, AMYLASE in the last 168 hours. No results for input(s): AMMONIA in the last 168 hours.  CBC: Recent Labs  Lab 03/29/20 0442 04/01/20 0634  WBC 8.3 23.4*  HGB 8.3* 9.7*  HCT 27.4* 31.3*  MCV 98.2 101.6*  PLT 174 172    Cardiac Enzymes: No results for input(s): CKTOTAL, CKMB, CKMBINDEX,  TROPONINI in the last 168 hours.  BNP (last 3 results) Recent Labs    09/06/19 1414 01/28/20 1503 02/25/20 1115  BNP 32.2 106.8* 282.7*    ProBNP (last 3 results) No results for input(s): PROBNP in the last 8760 hours.  Radiological Exams: DG Abd 1 View  Result Date: 03/31/2020 CLINICAL DATA:  Ileus/small bowel pattern. EXAM: ABDOMEN - 1 VIEW COMPARISON:  Radiograph 03/26/2020 FINDINGS: Feeding tube tip and side-port below the diaphragm in the stomach. High-density material typical of contrast is seen throughout the entire colon including the rectum. Decreased rectal distension from prior exam. Air-filled loops of prominent small bowel in the left abdomen, diminished from prior. There is mild gaseous gastric distension. IMPRESSION: 1. Persistent but diminishing air-filled small bowel, likely resolving ileus. No evidence of obstruction. 2. High-density material typical of contrast is seen throughout the entire colon. Decreased rectal distension from prior exam. Electronically Signed   By: Narda Rutherford M.D.   On: 03/31/2020 19:21   DG CHEST PORT 1 VIEW  Result Date: 03/31/2020 CLINICAL DATA:  Pneumonia.  Evaluate tracheostomy tube. EXAM: PORTABLE CHEST 1 VIEW COMPARISON:  03/23/2020 FINDINGS: Tracheostomy tube projects over the mid trachea. Feeding tube tip is in the proximal stomach. There is hyperinflation of the lungs compatible with COPD. Heart is mildly enlarged. Areas of scarring in the lungs bilaterally. Patchy left mid lung opacity has increased since prior study. No effusions.  No acute bony abnormality. IMPRESSION: COPD/chronic changes. Patchy opacity in the left mid lung concerning for pneumonia. Tracheostomy tube in expected position. Feeding tube tip in the proximal stomach. Electronically Signed   By: Charlett Nose M.D.   On: 03/31/2020 19:19    Assessment/Plan Active Problems:   Acute on chronic respiratory failure with hypoxia (HCC)   Seizure disorder (HCC)   COPD,  severe (HCC)   Anxiety disorder   1. Acute on chronic respiratory failure hypoxia we will continue with assist control mode currently on 50% FiO2. 2. Seizure disorder no active seizures noted at this time. 3. Severe COPD at baseline we will continue to monitor 4. Anxiety disorder no change   I have personally seen and evaluated the patient, evaluated laboratory and imaging results, formulated the assessment and plan and placed orders. The Patient requires high complexity decision making with multiple systems involvement.  Rounds were done with the Respiratory Therapy Director and Staff therapists and discussed with nursing staff also.  Yevonne Pax, MD [facility] Pulmonary Critical Care Medicine Sleep Medicine

## 2020-04-03 ENCOUNTER — Ambulatory Visit: Payer: PRIVATE HEALTH INSURANCE | Admitting: Emergency Medicine

## 2020-04-03 LAB — CBC WITH DIFFERENTIAL/PLATELET
Abs Immature Granulocytes: 0.04 10*3/uL (ref 0.00–0.07)
Basophils Absolute: 0 10*3/uL (ref 0.0–0.1)
Basophils Relative: 0 %
Eosinophils Absolute: 0 10*3/uL (ref 0.0–0.5)
Eosinophils Relative: 0 %
HCT: 24.6 % — ABNORMAL LOW (ref 36.0–46.0)
Hemoglobin: 7.3 g/dL — ABNORMAL LOW (ref 12.0–15.0)
Immature Granulocytes: 0 %
Lymphocytes Relative: 2 %
Lymphs Abs: 0.2 10*3/uL — ABNORMAL LOW (ref 0.7–4.0)
MCH: 30 pg (ref 26.0–34.0)
MCHC: 29.7 g/dL — ABNORMAL LOW (ref 30.0–36.0)
MCV: 101.2 fL — ABNORMAL HIGH (ref 80.0–100.0)
Monocytes Absolute: 0.2 10*3/uL (ref 0.1–1.0)
Monocytes Relative: 2 %
Neutro Abs: 9.4 10*3/uL — ABNORMAL HIGH (ref 1.7–7.7)
Neutrophils Relative %: 96 %
Platelets: 102 10*3/uL — ABNORMAL LOW (ref 150–400)
RBC: 2.43 MIL/uL — ABNORMAL LOW (ref 3.87–5.11)
RDW: 17.2 % — ABNORMAL HIGH (ref 11.5–15.5)
WBC: 9.8 10*3/uL (ref 4.0–10.5)
nRBC: 0 % (ref 0.0–0.2)

## 2020-04-03 LAB — TSH: TSH: 2.331 u[IU]/mL (ref 0.350–4.500)

## 2020-04-03 LAB — T4, FREE: Free T4: 0.78 ng/dL (ref 0.61–1.12)

## 2020-04-03 NOTE — Progress Notes (Signed)
Pulmonary Critical Care Medicine [facility] [place]   PULMONARY CRITICAL CARE SERVICE  PROGRESS NOTE  Date of Service: 04/03/2020  Kathleen Cameron  EHU:[id number]  DOB: 13-Nov-1965   DOA: 03/08/2020  Referring Physician: Carron Curie, MD  HPI: Kathleen Cameron is a 54 y.o. female seen for follow up of Acute on Chronic Respiratory Failure.  Patient currently is on assist control mode has been on 35% FiO2 good volumes are noted  Medications: Reviewed on Rounds  Physical Exam:  Vitals: Temperature 97.7 pulse 81 respiratory rate 20 blood pressure is 117/68 saturations 98%  Ventilator Settings on assist control FiO2 of 35%  . General: Comfortable at this time . Eyes: Grossly normal lids, irises & conjunctiva . ENT: grossly tongue is normal . Neck: no obvious mass . Cardiovascular: S1 S2 normal no gallop . Respiratory: No rhonchi very coarse breath sounds . Abdomen: soft . Skin: no rash seen on limited exam . Musculoskeletal: not rigid . Psychiatric:unable to assess . Neurologic: no seizure no involuntary movements         Lab Data:   Basic Metabolic Panel: Recent Labs  Lab 03/29/20 0442 04/01/20 0634  NA 143 144  K 4.3 4.6  CL 96* 97*  CO2 40* 38*  GLUCOSE 196* 196*  BUN 50* 46*  CREATININE 0.70 0.53  CALCIUM 9.0 8.8*  MG  --  2.1    ABG: No results for input(s): PHART, PCO2ART, PO2ART, HCO3, O2SAT in the last 168 hours.  Liver Function Tests: No results for input(s): AST, ALT, ALKPHOS, BILITOT, PROT, ALBUMIN in the last 168 hours. No results for input(s): LIPASE, AMYLASE in the last 168 hours. No results for input(s): AMMONIA in the last 168 hours.  CBC: Recent Labs  Lab 03/29/20 0442 04/01/20 0634 04/03/20 0509  WBC 8.3 23.4* 9.8  NEUTROABS  --   --  9.4*  HGB 8.3* 9.7* 7.3*  HCT 27.4* 31.3* 24.6*  MCV 98.2 101.6* 101.2*  PLT 174 172 102*    Cardiac Enzymes: No results for input(s): CKTOTAL, CKMB, CKMBINDEX, TROPONINI in the  last 168 hours.  BNP (last 3 results) Recent Labs    09/06/19 1414 01/28/20 1503 02/25/20 1115  BNP 32.2 106.8* 282.7*    ProBNP (last 3 results) No results for input(s): PROBNP in the last 8760 hours.  Radiological Exams: No results found.  Assessment/Plan Active Problems:   Acute on chronic respiratory failure with hypoxia (HCC)   Seizure disorder (HCC)   COPD, severe (HCC)   Anxiety disorder   1. Acute on chronic respiratory failure with hypoxia we will continue with assist control titrate oxygen continue pulmonary toilet. 2. Seizure disorder no active seizure noted at this time the 3. Severe COPD supportive care 4. Anxiety controlled   I have personally seen and evaluated the patient, evaluated laboratory and imaging results, formulated the assessment and plan and placed orders. The Patient requires high complexity decision making with multiple systems involvement.  Rounds were done with the Respiratory Therapy Director and Staff therapists and discussed with nursing staff also.  Yevonne Pax, MD [facility] Pulmonary Critical Care Medicine Sleep Medicine

## 2020-04-04 LAB — CULTURE, RESPIRATORY W GRAM STAIN: Gram Stain: NONE SEEN

## 2020-04-05 NOTE — Progress Notes (Signed)
Pulmonary Critical Care Medicine [facility] [place]   PULMONARY CRITICAL CARE SERVICE  PROGRESS NOTE  Date of Service: 04/05/2020  Kathleen Cameron  GOT:[id number]  DOB: 04-12-1966   DOA: 03/08/2020  Referring Physician: Carron Curie, MD  HPI: Kathleen Cameron is a 54 y.o. female seen for follow up of Acute on Chronic Respiratory Failure.  Patient at this time is on full support on the ventilator.  Not tolerating weaning.  Remains on assist control mode  Medications: Reviewed on Rounds  Physical Exam:  Vitals: Temperature is 97.3 pulse 85 respiratory 23 blood pressure is 123/78 saturations 98%  Ventilator Settings on assist control FiO2 is 45% tidal volume 350 PEEP 5  . General: Comfortable at this time . Eyes: Grossly normal lids, irises & conjunctiva . ENT: grossly tongue is normal . Neck: no obvious mass . Cardiovascular: S1 S2 normal no gallop . Respiratory: No rhonchi no rales . Abdomen: soft . Skin: no rash seen on limited exam . Musculoskeletal: not rigid . Psychiatric:unable to assess . Neurologic: no seizure no involuntary movements         Lab Data:   Basic Metabolic Panel: Recent Labs  Lab 04/01/20 0634  NA 144  K 4.6  CL 97*  CO2 38*  GLUCOSE 196*  BUN 46*  CREATININE 0.53  CALCIUM 8.8*  MG 2.1    ABG: No results for input(s): PHART, PCO2ART, PO2ART, HCO3, O2SAT in the last 168 hours.  Liver Function Tests: No results for input(s): AST, ALT, ALKPHOS, BILITOT, PROT, ALBUMIN in the last 168 hours. No results for input(s): LIPASE, AMYLASE in the last 168 hours. No results for input(s): AMMONIA in the last 168 hours.  CBC: Recent Labs  Lab 04/01/20 0634 04/03/20 0509  WBC 23.4* 9.8  NEUTROABS  --  9.4*  HGB 9.7* 7.3*  HCT 31.3* 24.6*  MCV 101.6* 101.2*  PLT 172 102*    Cardiac Enzymes: No results for input(s): CKTOTAL, CKMB, CKMBINDEX, TROPONINI in the last 168 hours.  BNP (last 3 results) Recent Labs     09/06/19 1414 01/28/20 1503 02/25/20 1115  BNP 32.2 106.8* 282.7*    ProBNP (last 3 results) No results for input(s): PROBNP in the last 8760 hours.  Radiological Exams: No results found.  Assessment/Plan Active Problems:   Acute on chronic respiratory failure with hypoxia (HCC)   Seizure disorder (HCC)   COPD, severe (HCC)   Anxiety disorder   1. Acute on chronic respiratory failure hypoxia patient's respiratory rate was at 20 on the ventilator spoke with respiratory therapy during rounds agreed to decrease the rate down to 12-14 2. Seizure disorder no active seizure noted 3. Severe COPD medical management 4. Anxiety disorder per primary care team   I have personally seen and evaluated the patient, evaluated laboratory and imaging results, formulated the assessment and plan and placed orders. The Patient requires high complexity decision making with multiple systems involvement.  Rounds were done with the Respiratory Therapy Director and Staff therapists and discussed with nursing staff also.  Yevonne Pax, MD [facility] Pulmonary Critical Care Medicine Sleep Medicine

## 2020-04-05 NOTE — Progress Notes (Signed)
Pulmonary Critical Care Medicine [facility] [place]   PULMONARY CRITICAL CARE SERVICE  PROGRESS NOTE  Date of Service: 04/04/2020  Kathleen Cameron  [id number]:[id number]  DOB: 1965/08/30   DOA: 03/08/2020  Referring Physician: Carron Curie, MD  HPI: Kathleen Cameron is a 54 y.o. female seen for follow up of Acute on Chronic Respiratory Failure.  Patient currently is on assist control mode comfortable right now without distress.  Has not been able to tolerate any  Medications: Reviewed on Rounds  Physical Exam:  Vitals: Temperature is 97.1 pulse 69 respiratory 24 blood pressure is 111/68 saturations 100%  Ventilator Settings on assist control FiO2 is 45% tidal volume 350 PEEP 5  . General: Comfortable at this time . Eyes: Grossly normal lids, irises & conjunctiva . ENT: grossly tongue is normal . Neck: no obvious mass . Cardiovascular: S1 S2 normal no gallop . Respiratory: No rhonchi no rales noted at this time . Abdomen: soft . Skin: no rash seen on limited exam . Musculoskeletal: not rigid . Psychiatric:unable to assess . Neurologic: no seizure no involuntary movements         Lab Data:   Basic Metabolic Panel: Recent Labs  Lab 04/01/20 0634  NA 144  K 4.6  CL 97*  CO2 38*  GLUCOSE 196*  BUN 46*  CREATININE 0.53  CALCIUM 8.8*  MG 2.1    ABG: No results for input(s): PHART, PCO2ART, PO2ART, HCO3, O2SAT in the last 168 hours.  Liver Function Tests: No results for input(s): AST, ALT, ALKPHOS, BILITOT, PROT, ALBUMIN in the last 168 hours. No results for input(s): LIPASE, AMYLASE in the last 168 hours. No results for input(s): AMMONIA in the last 168 hours.  CBC: Recent Labs  Lab 04/01/20 0634 04/03/20 0509  WBC 23.4* 9.8  NEUTROABS  --  9.4*  HGB 9.7* 7.3*  HCT 31.3* 24.6*  MCV 101.6* 101.2*  PLT 172 102*    Cardiac Enzymes: No results for input(s): CKTOTAL, CKMB, CKMBINDEX, TROPONINI in the last 168 hours.  BNP (last 3  results) Recent Labs    09/06/19 1414 01/28/20 1503 02/25/20 1115  BNP 32.2 106.8* 282.7*    ProBNP (last 3 results) No results for input(s): PROBNP in the last 8760 hours.  Radiological Exams: No results found.  Assessment/Plan Active Problems:   Acute on chronic respiratory failure with hypoxia (HCC)   Seizure disorder (HCC)   COPD, severe (HCC)   Anxiety disorder   1. Acute on chronic respiratory failure hypoxia we will continue with assist control titrate oxygen continue pulmonary toilet. 2. Seizure disorder no active seizures 3. Severe COPD at baseline 4. Anxiety disorder medical management   I have personally seen and evaluated the patient, evaluated laboratory and imaging results, formulated the assessment and plan and placed orders. The Patient requires high complexity decision making with multiple systems involvement.  Rounds were done with the Respiratory Therapy Director and Staff therapists and discussed with nursing staff also.  Yevonne Pax, MD [facility] Pulmonary Critical Care Medicine Sleep Medicine

## 2020-04-06 NOTE — Progress Notes (Signed)
Pulmonary Critical Care Medicine [facility] [place]   PULMONARY CRITICAL CARE SERVICE  PROGRESS NOTE  Date of Service: 04/06/2020  Arthurine Oleary  FGH:[id number]  DOB: 1966-04-12   DOA: 03/08/2020  Referring Physician: Carron Curie, MD  HPI: Kathleen Cameron is a 54 y.o. female seen for follow up of Acute on Chronic Respiratory Failure.  Patient currently is on pressure support has been on 40% FiO2 right now is on a pressure of 12/5  Medications: Reviewed on Rounds  Physical Exam:  Vitals: Temperature is 97.7 pulse 86 respiratory rate 19 blood pressure is 166/95 saturations 96%  Ventilator Settings on pressure support FiO2 of 40% pressure 12/5  . General: Comfortable at this time . Eyes: Grossly normal lids, irises & conjunctiva . ENT: grossly tongue is normal . Neck: no obvious mass . Cardiovascular: S1 S2 normal no gallop . Respiratory: Scattered rhonchi coarse breath sound . Abdomen: soft . Skin: no rash seen on limited exam . Musculoskeletal: not rigid . Psychiatric:unable to assess . Neurologic: no seizure no involuntary movements         Lab Data:   Basic Metabolic Panel: Recent Labs  Lab 04/01/20 0634  NA 144  K 4.6  CL 97*  CO2 38*  GLUCOSE 196*  BUN 46*  CREATININE 0.53  CALCIUM 8.8*  MG 2.1    ABG: No results for input(s): PHART, PCO2ART, PO2ART, HCO3, O2SAT in the last 168 hours.  Liver Function Tests: No results for input(s): AST, ALT, ALKPHOS, BILITOT, PROT, ALBUMIN in the last 168 hours. No results for input(s): LIPASE, AMYLASE in the last 168 hours. No results for input(s): AMMONIA in the last 168 hours.  CBC: Recent Labs  Lab 04/01/20 0634 04/03/20 0509  WBC 23.4* 9.8  NEUTROABS  --  9.4*  HGB 9.7* 7.3*  HCT 31.3* 24.6*  MCV 101.6* 101.2*  PLT 172 102*    Cardiac Enzymes: No results for input(s): CKTOTAL, CKMB, CKMBINDEX, TROPONINI in the last 168 hours.  BNP (last 3 results) Recent Labs     09/06/19 1414 01/28/20 1503 02/25/20 1115  BNP 32.2 106.8* 282.7*    ProBNP (last 3 results) No results for input(s): PROBNP in the last 8760 hours.  Radiological Exams: No results found.  Assessment/Plan Active Problems:   Acute on chronic respiratory failure with hypoxia (HCC)   Seizure disorder (HCC)   COPD, severe (HCC)   Anxiety disorder   1. Acute on chronic respiratory failure hypoxia currently is on pressure support on 40% FiO2 will try and advance weaning as tolerated. 2. Seizure disorder no active seizures noted at this time. 3. Severe COPD at baseline 4. Anxiety disorder no change   I have personally seen and evaluated the patient, evaluated laboratory and imaging results, formulated the assessment and plan and placed orders. The Patient requires high complexity decision making with multiple systems involvement.  Rounds were done with the Respiratory Therapy Director and Staff therapists and discussed with nursing staff also.  Yevonne Pax, MD [facility] Pulmonary Critical Care Medicine Sleep Medicine

## 2020-04-07 LAB — BLOOD GAS, ARTERIAL
Acid-Base Excess: 16.2 mmol/L — ABNORMAL HIGH (ref 0.0–2.0)
Bicarbonate: 42.4 mmol/L — ABNORMAL HIGH (ref 20.0–28.0)
FIO2: 40
O2 Saturation: 99.7 %
Patient temperature: 36
pCO2 arterial: 72.9 mmHg (ref 32.0–48.0)
pH, Arterial: 7.377 (ref 7.350–7.450)
pO2, Arterial: 198 mmHg — ABNORMAL HIGH (ref 83.0–108.0)

## 2020-04-07 NOTE — Progress Notes (Signed)
Pulmonary Critical Care Medicine [facility] [place]   PULMONARY CRITICAL CARE SERVICE  PROGRESS NOTE  Date of Service: 04/07/2020  Kathleen Cameron  OYD:[id number]  DOB: January 29, 1966   DOA: 03/08/2020  Referring Physician: Carron Curie, MD  HPI: Kathleen Cameron is a 54 y.o. female seen for follow up of Acute on Chronic Respiratory Failure.  Patient currently is on 40% FiO2 on pressure support seems to be doing well currently requiring pressure of 12/5  Medications: Reviewed on Rounds  Physical Exam:  Vitals: Temperature is 98.2 pulse 79 respiratory 25 blood pressure is 161/76 saturations 97%  Ventilator Settings on pressure support FiO2 40% pressure 12/5  . General: Comfortable at this time . Eyes: Grossly normal lids, irises & conjunctiva . ENT: grossly tongue is normal . Neck: no obvious mass . Cardiovascular: S1 S2 normal no gallop . Respiratory: No rhonchi very coarse breath sound . Abdomen: soft . Skin: no rash seen on limited exam . Musculoskeletal: not rigid . Psychiatric:unable to assess . Neurologic: no seizure no involuntary movements         Lab Data:   Basic Metabolic Panel: Recent Labs  Lab 04/01/20 0634  NA 144  K 4.6  CL 97*  CO2 38*  GLUCOSE 196*  BUN 46*  CREATININE 0.53  CALCIUM 8.8*  MG 2.1    ABG: Recent Labs  Lab 04/07/20 0855  PHART 7.377  PCO2ART 72.9*  PO2ART 198*  HCO3 42.4*  O2SAT 99.7    Liver Function Tests: No results for input(s): AST, ALT, ALKPHOS, BILITOT, PROT, ALBUMIN in the last 168 hours. No results for input(s): LIPASE, AMYLASE in the last 168 hours. No results for input(s): AMMONIA in the last 168 hours.  CBC: Recent Labs  Lab 04/01/20 0634 04/03/20 0509  WBC 23.4* 9.8  NEUTROABS  --  9.4*  HGB 9.7* 7.3*  HCT 31.3* 24.6*  MCV 101.6* 101.2*  PLT 172 102*    Cardiac Enzymes: No results for input(s): CKTOTAL, CKMB, CKMBINDEX, TROPONINI in the last 168 hours.  BNP (last 3  results) Recent Labs    09/06/19 1414 01/28/20 1503 02/25/20 1115  BNP 32.2 106.8* 282.7*    ProBNP (last 3 results) No results for input(s): PROBNP in the last 8760 hours.  Radiological Exams: No results found.  Assessment/Plan Active Problems:   Acute on chronic respiratory failure with hypoxia (HCC)   Seizure disorder (HCC)   COPD, severe (HCC)   Anxiety disorder   1. Acute on chronic respiratory failure hypoxia we will continue with pressure support mode on 40% FiO2 currently requiring pressure of 12/5 2. Seizure disorder no active seizure noted at this time. 3. Severe COPD at baseline 4. Anxiety disorder no change   I have personally seen and evaluated the patient, evaluated laboratory and imaging results, formulated the assessment and plan and placed orders. The Patient requires high complexity decision making with multiple systems involvement.  Rounds were done with the Respiratory Therapy Director and Staff therapists and discussed with nursing staff also.  Yevonne Pax, MD [facility] Pulmonary Critical Care Medicine Sleep Medicine

## 2020-04-08 NOTE — Progress Notes (Signed)
Pulmonary Critical Care Medicine [facility] [place]   PULMONARY CRITICAL CARE SERVICE  PROGRESS NOTE  Date of Service: 04/08/2020  Jasmeen Fritsch  VOZ:[id number]  DOB: [birth year]   DOA: 03/08/2020  Referring Physician: Carron Curie, MD  HPI: Kathleen Cameron is a 54 y.o. female seen for follow up of Acute on Chronic Respiratory Failure.  Patient currently is on T collar has been on 40% FiO2 was able to do 6 hours yesterday on T collar today we will try to go for 8 hours  Medications: Reviewed on Rounds  Physical Exam:  Vitals: Temperature 96.1 pulse 61 respiratory rate 26 blood pressure is 144/85 saturations 97%  Ventilator Settings off the ventilator on T collar FiO2 40%  . General: Comfortable at this time . Eyes: Grossly normal lids, irises & conjunctiva . ENT: grossly tongue is normal . Neck: no obvious mass . Cardiovascular: S1 S2 normal no gallop . Respiratory: No rhonchi coarse breath sounds . Abdomen: soft . Skin: no rash seen on limited exam . Musculoskeletal: not rigid . Psychiatric:unable to assess . Neurologic: no seizure no involuntary movements         Lab Data:   Basic Metabolic Panel: No results for input(s): NA, K, CL, CO2, GLUCOSE, BUN, CREATININE, CALCIUM, MG, PHOS in the last 168 hours.  ABG: Recent Labs  Lab 04/07/20 0855  PHART 7.377  PCO2ART 72.9*  PO2ART 198*  HCO3 42.4*  O2SAT 99.7    Liver Function Tests: No results for input(s): AST, ALT, ALKPHOS, BILITOT, PROT, ALBUMIN in the last 168 hours. No results for input(s): LIPASE, AMYLASE in the last 168 hours. No results for input(s): AMMONIA in the last 168 hours.  CBC: Recent Labs  Lab 04/03/20 0509  WBC 9.8  NEUTROABS 9.4*  HGB 7.3*  HCT 24.6*  MCV 101.2*  PLT 102*    Cardiac Enzymes: No results for input(s): CKTOTAL, CKMB, CKMBINDEX, TROPONINI in the last 168 hours.  BNP (last 3 results) Recent Labs    09/06/19 1414 01/28/20 1503 02/25/20 1115   BNP 32.2 106.8* 282.7*    ProBNP (last 3 results) No results for input(s): PROBNP in the last 8760 hours.  Radiological Exams: No results found.  Assessment/Plan Active Problems:   Acute on chronic respiratory failure with hypoxia (HCC)   Seizure disorder (HCC)   COPD, severe (HCC)   Anxiety disorder   1. Acute on chronic respiratory failure with hypoxia we will continue to wean off the ventilator on T collar as mentioned the goal will be 8 hours 2. Seizure disorder no seizures have been noted 3. Severe COPD medical management 4. Anxiety disorder appears to be better controlled   I have personally seen and evaluated the patient, evaluated laboratory and imaging results, formulated the assessment and plan and placed orders. The Patient requires high complexity decision making with multiple systems involvement.  Rounds were done with the Respiratory Therapy Director and Staff therapists and discussed with nursing staff also.  Yevonne Pax, MD [facility] Pulmonary Critical Care Medicine Sleep Medicine

## 2020-04-09 ENCOUNTER — Other Ambulatory Visit (HOSPITAL_COMMUNITY): Payer: PRIVATE HEALTH INSURANCE

## 2020-04-09 NOTE — Progress Notes (Signed)
Pulmonary Critical Care Medicine [facility] [place]   PULMONARY CRITICAL CARE SERVICE  PROGRESS NOTE  Date of Service: 04/09/2020  Kathleen Cameron  SWH:[id number]  DOB: 16-Jan-1966   DOA: 03/08/2020  Referring Physician: Carron Curie, MD  HPI: Kathleen Cameron is a 54 y.o. female seen for follow up of Acute on Chronic Respiratory Failure.  Patient currently is on assist control mode has been on 40% FiO2 with good saturations.  Medications: Reviewed on Rounds  Physical Exam:  Vitals: Temperature 96.8 pulse 75 respiratory 24 blood pressure is 188/100 saturations 97%  Ventilator Settings on assist control FiO2 40% tidal volume 350 PEEP 5  . General: Comfortable at this time . Eyes: Grossly normal lids, irises & conjunctiva . ENT: grossly tongue is normal . Neck: no obvious mass . Cardiovascular: S1 S2 normal no gallop . Respiratory: No rhonchi coarse breath sounds . Abdomen: soft . Skin: no rash seen on limited exam . Musculoskeletal: not rigid . Psychiatric:unable to assess . Neurologic: no seizure no involuntary movements         Lab Data:   Basic Metabolic Panel: No results for input(s): NA, K, CL, CO2, GLUCOSE, BUN, CREATININE, CALCIUM, MG, PHOS in the last 168 hours.  ABG: Recent Labs  Lab 04/07/20 0855  PHART 7.377  PCO2ART 72.9*  PO2ART 198*  HCO3 42.4*  O2SAT 99.7    Liver Function Tests: No results for input(s): AST, ALT, ALKPHOS, BILITOT, PROT, ALBUMIN in the last 168 hours. No results for input(s): LIPASE, AMYLASE in the last 168 hours. No results for input(s): AMMONIA in the last 168 hours.  CBC: Recent Labs  Lab 04/03/20 0509  WBC 9.8  NEUTROABS 9.4*  HGB 7.3*  HCT 24.6*  MCV 101.2*  PLT 102*    Cardiac Enzymes: No results for input(s): CKTOTAL, CKMB, CKMBINDEX, TROPONINI in the last 168 hours.  BNP (last 3 results) Recent Labs    09/06/19 1414 01/28/20 1503 02/25/20 1115  BNP 32.2 106.8* 282.7*    ProBNP  (last 3 results) No results for input(s): PROBNP in the last 8760 hours.  Radiological Exams: No results found.  Assessment/Plan Active Problems:   Acute on chronic respiratory failure with hypoxia (HCC)   Seizure disorder (HCC)   COPD, severe (HCC)   Anxiety disorder   1. Acute on chronic respiratory failure hypoxia continue with assist control titrate oxygen continue pulmonary toilet 2. Seizure disorder no active seizures 3. Severe COPD at baseline 4. Anxiety disorder better controlled   I have personally seen and evaluated the patient, evaluated laboratory and imaging results, formulated the assessment and plan and placed orders. The Patient requires high complexity decision making with multiple systems involvement.  Rounds were done with the Respiratory Therapy Director and Staff therapists and discussed with nursing staff also.  Yevonne Pax, MD [facility] Pulmonary Critical Care Medicine Sleep Medicine

## 2020-04-10 ENCOUNTER — Other Ambulatory Visit: Payer: Self-pay

## 2020-04-10 ENCOUNTER — Emergency Department (HOSPITAL_COMMUNITY)
Admission: EM | Admit: 2020-04-10 | Discharge: 2020-04-10 | Disposition: A | Discharge status: Home / Self Care | Payer: PRIVATE HEALTH INSURANCE | Attending: Emergency Medicine | Admitting: Emergency Medicine

## 2020-04-10 ENCOUNTER — Encounter (HOSPITAL_COMMUNITY): Payer: Self-pay

## 2020-04-10 ENCOUNTER — Inpatient Hospital Stay
Admission: AD | Admit: 2020-04-10 | Discharge: 2020-06-02 | Disposition: A | Discharge status: Home Health | Payer: PRIVATE HEALTH INSURANCE | Source: Other Acute Inpatient Hospital | Attending: Internal Medicine | Admitting: Internal Medicine

## 2020-04-10 DIAGNOSIS — R109 Unspecified abdominal pain: Secondary | ICD-10-CM

## 2020-04-10 DIAGNOSIS — J9621 Acute and chronic respiratory failure with hypoxia: Secondary | ICD-10-CM | POA: Diagnosis present

## 2020-04-10 DIAGNOSIS — N189 Chronic kidney disease, unspecified: Secondary | ICD-10-CM | POA: Diagnosis not present

## 2020-04-10 DIAGNOSIS — E039 Hypothyroidism, unspecified: Secondary | ICD-10-CM | POA: Diagnosis not present

## 2020-04-10 DIAGNOSIS — F419 Anxiety disorder, unspecified: Secondary | ICD-10-CM | POA: Diagnosis present

## 2020-04-10 DIAGNOSIS — R140 Abdominal distension (gaseous): Secondary | ICD-10-CM

## 2020-04-10 DIAGNOSIS — J449 Chronic obstructive pulmonary disease, unspecified: Secondary | ICD-10-CM | POA: Diagnosis present

## 2020-04-10 DIAGNOSIS — R0603 Acute respiratory distress: Secondary | ICD-10-CM

## 2020-04-10 DIAGNOSIS — J441 Chronic obstructive pulmonary disease with (acute) exacerbation: Secondary | ICD-10-CM | POA: Insufficient documentation

## 2020-04-10 DIAGNOSIS — Z79899 Other long term (current) drug therapy: Secondary | ICD-10-CM | POA: Insufficient documentation

## 2020-04-10 DIAGNOSIS — Z7982 Long term (current) use of aspirin: Secondary | ICD-10-CM | POA: Diagnosis not present

## 2020-04-10 DIAGNOSIS — R52 Pain, unspecified: Secondary | ICD-10-CM

## 2020-04-10 DIAGNOSIS — J9600 Acute respiratory failure, unspecified whether with hypoxia or hypercapnia: Secondary | ICD-10-CM | POA: Diagnosis present

## 2020-04-10 DIAGNOSIS — I129 Hypertensive chronic kidney disease with stage 1 through stage 4 chronic kidney disease, or unspecified chronic kidney disease: Secondary | ICD-10-CM | POA: Diagnosis not present

## 2020-04-10 DIAGNOSIS — Z4659 Encounter for fitting and adjustment of other gastrointestinal appliance and device: Secondary | ICD-10-CM

## 2020-04-10 DIAGNOSIS — Z431 Encounter for attention to gastrostomy: Secondary | ICD-10-CM | POA: Diagnosis not present

## 2020-04-10 DIAGNOSIS — R509 Fever, unspecified: Secondary | ICD-10-CM

## 2020-04-10 DIAGNOSIS — J45909 Unspecified asthma, uncomplicated: Secondary | ICD-10-CM | POA: Diagnosis not present

## 2020-04-10 DIAGNOSIS — J9690 Respiratory failure, unspecified, unspecified whether with hypoxia or hypercapnia: Secondary | ICD-10-CM

## 2020-04-10 DIAGNOSIS — G40909 Epilepsy, unspecified, not intractable, without status epilepticus: Secondary | ICD-10-CM

## 2020-04-10 DIAGNOSIS — J189 Pneumonia, unspecified organism: Secondary | ICD-10-CM

## 2020-04-10 LAB — CBC
HCT: 27.8 % — ABNORMAL LOW (ref 36.0–46.0)
Hemoglobin: 8.3 g/dL — ABNORMAL LOW (ref 12.0–15.0)
MCH: 31.8 pg (ref 26.0–34.0)
MCHC: 29.9 g/dL — ABNORMAL LOW (ref 30.0–36.0)
MCV: 106.5 fL — ABNORMAL HIGH (ref 80.0–100.0)
Platelets: 121 10*3/uL — ABNORMAL LOW (ref 150–400)
RBC: 2.61 MIL/uL — ABNORMAL LOW (ref 3.87–5.11)
RDW: 19.1 % — ABNORMAL HIGH (ref 11.5–15.5)
WBC: 13.1 10*3/uL — ABNORMAL HIGH (ref 4.0–10.5)
nRBC: 0 % (ref 0.0–0.2)

## 2020-04-10 LAB — BASIC METABOLIC PANEL
Anion gap: 13 (ref 5–15)
BUN: 56 mg/dL — ABNORMAL HIGH (ref 6–20)
CO2: 34 mmol/L — ABNORMAL HIGH (ref 22–32)
Calcium: 9 mg/dL (ref 8.9–10.3)
Chloride: 97 mmol/L — ABNORMAL LOW (ref 98–111)
Creatinine, Ser: 0.49 mg/dL (ref 0.44–1.00)
GFR, Estimated: >60 mL/min (ref >60)
Glucose, Bld: 174 mg/dL — ABNORMAL HIGH (ref 70–99)
Potassium: 4.9 mmol/L (ref 3.5–5.1)
Sodium: 144 mmol/L (ref 135–145)

## 2020-04-10 LAB — BLOOD GAS, ARTERIAL
Acid-Base Excess: 14.5 mmol/L — ABNORMAL HIGH (ref 0.0–2.0)
Bicarbonate: 40.4 mmol/L — ABNORMAL HIGH (ref 20.0–28.0)
FIO2: 64
O2 Saturation: 99.6 %
Patient temperature: 36.8
pCO2 arterial: 71 mmHg (ref 32.0–48.0)
pH, Arterial: 7.373 (ref 7.350–7.450)
pO2, Arterial: 150 mmHg — ABNORMAL HIGH (ref 83.0–108.0)

## 2020-04-10 NOTE — Discharge Instructions (Addendum)
You were seen in the emergency department for a surgical consult regarding your feeding tube.  Surgical consult to follow.  They recommended to you be discharged to return up to your facility.

## 2020-04-10 NOTE — ED Triage Notes (Signed)
[facility]. Pt vented on trach. Pt a.o, no complaints.

## 2020-04-10 NOTE — ED Provider Notes (Signed)
[facility] EMERGENCY DEPARTMENT Provider Note   CSN: [id number] Arrival date & time: 04/10/20  1301     History No chief complaint on file.   Kathleen Cameron is a 54 y.o. female.  She is an inpatient at [facility].  Level 5 caveat secondary to patient nonverbal.  History given by family member.  She was brought down from unit to have a surgical evaluation.  It seems to involve something about her feeding tube.  Family say it is irritating her nostril and sometimes causing some nosebleeds.  Per last x-ray tube extends in the patient's stomach but no further.  Patient is currently on a vent via trach.  She denies any current shortness of breath.  The history is provided by the patient and a relative. The history is limited by the condition of the patient.       Past Medical History:  Diagnosis Date   Acute on chronic respiratory failure with hypoxia (HCC)    Anxiety disorder    Asthma    COPD (chronic obstructive pulmonary disease) (HCC)    COPD, severe (HCC)    Hypertension    Seizure disorder [facility])     Patient Active Problem List   Diagnosis Date Noted   Acute on chronic respiratory failure with hypoxia ([place])    Seizure disorder (HCC)    COPD, severe ([place])    Anxiety disorder    Generalized abdominal pain    Convulsions ([place])    Malnutrition of moderate degree 02/27/2020   Hypotension    Acute hypercapnic respiratory failure ([place]) 02/25/2020   Right upper quadrant abdominal pain 02/01/2020   Hypothyroidism (acquired) 45/80/9983   Acute metabolic encephalopathy 38/25/0539   Hyponatremia 01/28/2020   Anxiety 12/02/2019   COPD exacerbation ([place]) 11/06/2019   Depression 11/06/2019   Acute on chronic respiratory failure with hypoxia and hypercapnia (HCC) 11/06/2019   Respiratory failure ([place]) 11/06/2019   Current moderate episode of major depressive disorder without prior episode ([place]) 09/20/2019   Pedal edema 08/04/2019    Chronic respiratory failure with hypoxia ([place]) 03/07/2019   CKD (chronic kidney disease) 01/05/2019   COPD (chronic obstructive pulmonary disease) ([place]) 09/29/2018   Pulmonary nodules 09/27/2018   Dyslipidemia 07/07/2018   Essential hypertension 03/10/2018    Past Surgical History:  Procedure Laterality Date   IR FLUORO RM 30-60 MIN  03/23/2020   WISDOM TOOTH EXTRACTION       OB History   No obstetric history on file.     Family History  Family history unknown: Yes    Social History   Tobacco Use   Smoking status: Never Smoker   Smokeless tobacco: Never Used  Vaping Use   Vaping Use: Never used  Substance Use Topics   Alcohol use: Yes    Alcohol/week: 2.0 standard drinks    Types: 2 Cans of beer per week   Drug use: Never    Home Medications Prior to Admission medications   Medication Sig Start Date End Date Taking? Authorizing Provider  amLODipine (NORVASC) 5 MG tablet Place 2 tablets (10 mg total) into feeding tube daily. 03/09/20   Candee Furbish, MD  arformoterol (BROVANA) 15 MCG/2ML NEBU Take 2 mLs (15 mcg total) by nebulization 2 (two) times daily. 03/08/20   Candee Furbish, MD  aspirin 81 MG chewable tablet Place 1 tablet (81 mg total) into feeding tube daily. 03/09/20   Candee Furbish, MD  bisacodyl (DULCOLAX) 10 MG suppository Place 1 suppository (10 mg  total) rectally daily as needed for severe constipation. 03/08/20   Candee Furbish, MD  budesonide (PULMICORT) 0.25 MG/2ML nebulizer solution Take 2 mLs (0.25 mg total) by nebulization 2 (two) times daily. 03/08/20   Candee Furbish, MD  Chlorhexidine Gluconate Cloth 2 % PADS Apply 6 each topically daily. 03/09/20   Candee Furbish, MD  chlorhexidine gluconate, MEDLINE KIT, (PERIDEX) 0.12 % solution 15 mLs by Mouth Rinse route 2 (two) times daily. 03/08/20   Candee Furbish, MD  clonazePAM (KLONOPIN) 1 MG tablet Place 1 tablet (1 mg total) into feeding tube 3 (three) times daily. 03/08/20   Candee Furbish, MD  enoxaparin (LOVENOX) 40 MG/0.4ML injection Inject 0.4 mLs (40 mg total) into the skin daily. 03/09/20   Candee Furbish, MD  famotidine (PEPCID) 40 MG/5ML suspension Place 2.5 mLs (20 mg total) into feeding tube 2 (two) times daily. 03/08/20   Candee Furbish, MD  fentaNYL (SUBLIMAZE) 100 MCG/2ML injection Inject 0.5 mLs (25 mcg total) into the vein every 2 (two) hours as needed for severe pain. 03/08/20   Candee Furbish, MD  levothyroxine (SYNTHROID) 50 MCG tablet Take 1 tablet (50 mcg total) by mouth daily at 6 (six) AM. 02/03/20   Charlynne Cousins, MD  lip balm (CARMEX) ointment Apply topically as needed for lip care. 03/08/20   Candee Furbish, MD  metoCLOPramide (REGLAN) 10 MG tablet Place 1 tablet (10 mg total) into feeding tube every 8 (eight) hours. 03/08/20   Candee Furbish, MD  midazolam (VERSED) 2 MG/2ML SOLN injection Inject 2 mLs (2 mg total) into the vein every 2 (two) hours as needed for sedation. 03/08/20   Candee Furbish, MD  Nutritional Supplements (FEEDING SUPPLEMENT, OSMOLITE 1.2 CAL,) LIQD Place 1,000 mLs into feeding tube continuous. 03/08/20   Candee Furbish, MD  pantoprazole sodium (PROTONIX) 40 mg/20 mL PACK Place 20 mLs (40 mg total) into feeding tube at bedtime. 03/08/20   Candee Furbish, MD  phenol (CHLORASEPTIC) 1.4 % LIQD Use as directed 1 spray in the mouth or throat as needed for throat irritation / pain. 03/08/20   Candee Furbish, MD  polyethylene glycol (MIRALAX / GLYCOLAX) 17 g packet Place 17 g into feeding tube daily as needed for mild constipation. 03/08/20   Candee Furbish, MD  prednisoLONE acetate (PRED FORTE) 1 % ophthalmic suspension Place 1 drop into the left eye 4 (four) times daily. 03/08/20   Candee Furbish, MD  revefenacin (YUPELRI) 175 MCG/3ML nebulizer solution Take 3 mLs (175 mcg total) by nebulization daily. 03/09/20   Candee Furbish, MD  rosuvastatin (CRESTOR) 40 MG tablet Place 1 tablet (40 mg total) into feeding tube  daily. 03/08/20   Candee Furbish, MD  senna-docusate (SENOKOT-S) 8.6-50 MG tablet Place 2 tablets into feeding tube at bedtime as needed for moderate constipation. 03/08/20   Candee Furbish, MD  sodium chloride (OCEAN) 0.65 % SOLN nasal spray Place 1 spray into both nostrils as needed for congestion. 03/08/20   Candee Furbish, MD  temazepam (RESTORIL) 15 MG capsule Place 1 capsule (15 mg total) into feeding tube at bedtime. 03/08/20   Candee Furbish, MD  venlafaxine [facility]) 37.5 MG tablet Place 1 tablet (37.5 mg total) into feeding tube 2 (two) times daily with a meal. 03/08/20   Candee Furbish, MD    Allergies    Stiolto respimat [tiotropium bromide-olodaterol]  Review of Systems   Review of  Systems  Unable to perform ROS: Patient nonverbal    Physical Exam Updated Vital Signs BP (!) 83/66 (BP Location: Right Arm)    Pulse 91    Temp 98.4 F (36.9 C) (Oral)    Resp 16    SpO2 100%   Physical Exam Vitals and nursing note reviewed.  Constitutional:      General: She is not in acute distress.    Appearance: Normal appearance. She is well-developed and well-nourished.  HENT:     Head: Normocephalic and atraumatic.     Nose:     Comments: Patient has a feeding tube in right nostril. Eyes:     Conjunctiva/sclera: Conjunctivae normal.  Neck:     Comments: Tracheostomy in place, no active bleeding Cardiovascular:     Rate and Rhythm: Normal rate and regular rhythm.     Heart sounds: No murmur heard.   Pulmonary:     Effort: Pulmonary effort is normal. No respiratory distress.     Breath sounds: Normal breath sounds.  Abdominal:     Palpations: Abdomen is soft.     Tenderness: There is no abdominal tenderness.  Musculoskeletal:        General: No deformity, signs of injury or edema.  Skin:    General: Skin is warm and dry.     Capillary Refill: Capillary refill takes less than 2 seconds.  Neurological:     General: No focal deficit present.     Mental Status: She is  alert.     Comments: Able to move arms and legs at command.  Psychiatric:        Mood and Affect: Mood and affect normal.     ED Results / Procedures / Treatments   Labs (all labs ordered are listed, but only abnormal results are displayed) Labs Reviewed - No data to display  EKG None  Radiology DG Abd 1 View  Result Date: 04/09/2020 CLINICAL DATA:  Enteric catheter placement EXAM: ABDOMEN - 1 VIEW COMPARISON:  04/09/2020 FINDINGS: Frontal view of the lower chest and upper abdomen demonstrates enteric catheter passes below diaphragm, tip and side port projecting over gastric antrum. No significant change since prior study. Distended gas-filled loops of large and small bowel are unchanged. Patchy areas of consolidation at the lung bases are stable. IMPRESSION: 1. Stable enteric catheter overlying gastric antrum. Electronically Signed   By: Sharlet Salina M.D.   On: 04/09/2020 15:27   DG Abd Portable 1V  Result Date: 04/09/2020 CLINICAL DATA:  Enteric tube placement. EXAM: PORTABLE ABDOMEN - 1 VIEW COMPARISON:  Abdominal x-ray dated March 31, 2020. FINDINGS: Enteric tube in the distal stomach. Persistent mildly distended small bowel loops in the left upper quadrant. Continued patchy opacities throughout the left lung. IMPRESSION: 1. Enteric tube in the distal stomach. Electronically Signed   By: Obie Dredge M.D.   On: 04/09/2020 13:32    Procedures Procedures (including critical care time)  Medications Ordered in ED Medications - No data to display  ED Course  I have reviewed the triage vital signs and the nursing notes.  Pertinent labs & imaging results that were available during my care of the patient were reviewed by me and considered in my medical decision making (see chart for details).  Clinical Course as of 04/10/20 1326  Tue Apr 10, 2020  1325 Discussed with Nehemiah Settle from general surgery.  She said they will come consult on the patient. [MB]    Clinical Course  User Index [MB] Charm Barges,  Rebeca Alert, MD   MDM Rules/Calculators/A&P                         Patient was seen by general surgery.  They have made some recommendations regarding the patient's feeding tube.  Family was comfortable with her returning back to her facility.  Final Clinical Impression(s) / ED Diagnoses Final diagnoses:  Encounter for care related to feeding tube    Rx / DC Orders ED Discharge Orders    None       Hayden Rasmussen, MD 04/11/20 [phone]

## 2020-04-10 NOTE — Consult Note (Signed)
Consult Note  Kathleen Cameron [birth year]  [id number].    Requesting MD: Hijazi Chief Complaint/Reason for Consult: gastrostomy placement  HPI:  Patient is a 54 year old female PMH HTN, anxiety/depression, and severe COPD on 2L O2 at home, who was admitted to [facility] 02/25/20 with acute on chronic hypoxic respiratory failure requiring intubation. Husband at bedside who helped with history taking.  Multiple attempts made to wean from the ventilator but unsuccessful. She underwent tracheostomy 03/02/20. She was discharged to [facility] on 03/08/20. Patient has continued to try to wean to trach collar but has not been able to tolerate more than several hours at a time. Apparently she was weaning better last week versus this week. She developed pneumonia last week and just completed a course of IV cefepime on 12/19 for this. Previously tolerating tube feeds via Cortrak but per the patient's husband some time over the weekend Cortrak was removed and she now has a large bore NG tube in her right nare.  There was concern that the nasogastric tube is impeding her ability to wean from the ventilator. IR was consulted for possible PEG placement but were unable to do this because colon was anterior to patient's stomach. General surgery asked to see for possible open gastrostomy tube placement.  No past abdominal surgery. Allergy listed to stiolto respimat.  No cardiac history Nonsmoker PTA worked in Midwife office  ROS: Review of Systems  Constitutional: Positive for malaise/fatigue.  Respiratory: Positive for shortness of breath.   Cardiovascular: Negative.   Gastrointestinal: Negative.   Genitourinary: Negative.   Musculoskeletal: Positive for joint pain.    Family History  Family history unknown: Yes    Past Medical History:  Diagnosis Date  . Acute on chronic respiratory failure with hypoxia (HCC)   . Anxiety disorder   . Asthma   . COPD (chronic obstructive  pulmonary disease) (HCC)   . COPD, severe (HCC)   . Hypertension   . Seizure disorder [facility])     Past Surgical History:  Procedure Laterality Date  . IR FLUORO RM 30-60 MIN  03/23/2020  . WISDOM TOOTH EXTRACTION      Social History:  reports that she has never smoked. She has never used smokeless tobacco. She reports current alcohol use of about 2.0 standard drinks of alcohol per week. She reports that she does not use drugs.  Allergies:  Allergies  Allergen Reactions  . Stiolto Respimat [Tiotropium Bromide-Olodaterol] Other (See Comments)    Severe headaches and rapid heartrate    (Not in a hospital admission)   Blood pressure 97/70, pulse 87, temperature 98.4 F (36.9 C), temperature source Oral, resp. rate 16, height 5\' 4"  (1.626 m), weight 57 kg, SpO2 99 %. Physical Exam:  General: pleasant, chronically ill appearing female who is laying in bed in NAD HEENT: head is normocephalic, atraumatic.  Sclera are noninjected.  PERRL.  Exophthalmos bilaterally. Ears and nose without any masses or lesions.  Mouth is pink and moist. Large bore NG tube in right nare. Trach in place. Heart: regular, rate, and rhythm.  Normal s1,s2. No obvious murmurs, gallops, or rubs noted.  Palpable radial and pedal pulses bilaterally Lungs: decreased breath sounds bilateral bases. No wheezing or rhonchi. Mechanically ventilated Abd: soft, mild distension, NT, +BS, no masses, hernias, or organomegaly MS: all 4 extremities are symmetrical with no cyanosis, clubbing, or edema. Skin: warm and dry with no masses, lesions, or rashes Neuro: Cranial nerves 2-12 grossly intact,  sensation is normal throughout Psych: appears A&O, nods appropriately to questions, appropriate affect.   Results for orders placed or performed during the hospital encounter of 03/08/20 (from the past 48 hour(s))  CBC     Status: Abnormal   Collection Time: 04/10/20  3:27 AM  Result Value Ref Range   WBC 13.1 (H) 4.0 - 10.5 K/uL    RBC 2.61 (L) 3.87 - 5.11 MIL/uL   Hemoglobin 8.3 (L) 12.0 - 15.0 g/dL   HCT 16.1 (L) 09.6 - 04.5 %   MCV 106.5 (H) 80.0 - 100.0 fL   MCH 31.8 26.0 - 34.0 pg   MCHC 29.9 (L) 30.0 - 36.0 g/dL   RDW 40.9 (H) 81.1 - 91.4 %   Platelets 121 (L) 150 - 400 K/uL   nRBC 0.0 0.0 - 0.2 %    Comment: Performed at [facility], [address] [place], [place] [address]  Basic metabolic panel     Status: Abnormal   Collection Time: 04/10/20  3:27 AM  Result Value Ref Range   Sodium 144 135 - 145 mmol/L   Potassium 4.9 3.5 - 5.1 mmol/L   Chloride 97 (L) 98 - 111 mmol/L   CO2 34 (H) 22 - 32 mmol/L   Glucose, Bld 174 (H) 70 - 99 mg/dL    Comment: Glucose reference range applies only to samples taken after fasting for at least 8 hours.   BUN 56 (H) 6 - 20 mg/dL   Creatinine, Ser 6.21 0.44 - 1.00 mg/dL   Calcium 9.0 8.9 - 30.8 mg/dL   GFR, Estimated >65 >78 mL/min    Comment: (NOTE) Calculated using the CKD-EPI Creatinine Equation (2021)    Anion gap 13 5 - 15    Comment: Performed at [facility], [address] [place], [place] [address]  Blood gas, arterial     Status: Abnormal   Collection Time: 04/10/20  7:43 AM  Result Value Ref Range   FIO2 64.00    pH, Arterial 7.373 7.350 - 7.450   pCO2 arterial 71.0 (HH) 32.0 - 48.0 mmHg    Comment: CRITICAL RESULT CALLED TO, READ BACK BY AND VERIFIED WITH: B.FIELDS,RN 04/10/2020 0757 DAVISB    pO2, Arterial 150 (H) 83.0 - 108.0 mmHg   Bicarbonate 40.4 (H) 20.0 - 28.0 mmol/L   Acid-Base Excess 14.5 (H) 0.0 - 2.0 mmol/L   O2 Saturation 99.6 %   Patient temperature 36.8    Collection site RIGHT BRACHIAL    Drawn by COLLECTED BY RT     Comment: N.PAYNE   Sample type ARTERIAL DRAW     Comment: Performed at [facility], [address], [place], [place] [address]   DG Abd 1 View  Result Date: 04/09/2020 CLINICAL DATA:  Enteric catheter placement EXAM: ABDOMEN - 1 VIEW COMPARISON:  04/09/2020 FINDINGS: Frontal view of the lower  chest and upper abdomen demonstrates enteric catheter passes below diaphragm, tip and side port projecting over gastric antrum. No significant change since prior study. Distended gas-filled loops of large and small bowel are unchanged. Patchy areas of consolidation at the lung bases are stable. IMPRESSION: 1. Stable enteric catheter overlying gastric antrum. Electronically Signed   By: Sharlet Salina M.D.   On: 04/09/2020 15:27   DG Abd Portable 1V  Result Date: 04/09/2020 CLINICAL DATA:  Enteric tube placement. EXAM: PORTABLE ABDOMEN - 1 VIEW COMPARISON:  Abdominal x-ray dated March 31, 2020. FINDINGS: Enteric tube in the distal stomach.  Persistent mildly distended small bowel loops in the left upper quadrant. Continued patchy opacities throughout the left lung. IMPRESSION: 1. Enteric tube in the distal stomach. Electronically Signed   By: Obie Dredge M.D.   On: 04/09/2020 13:32      Assessment/Plan Advanced COPD/Acute on chronic respiratory failure s/p tracheostomy 03/02/20 Chronic steroid use - currently on 20mg  prednisone daily HTN Seizure disorder Anxiety/depression with panic attacks Hypothyroidism Chronic constipation   Consult for gastrostomy tube placement  - Patient currently admitted to [facility] with advanced COPD/Acute on chronic respiratory failure s/p tracheostomy 03/02/20. She is having difficulty weaning from the ventilator. There was concern that the nasogastric tube is impeding her ability to wean from the ventilator. She currently has a large bore NG tube in her right nare, unsure when the previously placed Cortak was removed but the patient's husband thinks this was switched over the weekend. She also recently had a pneumonia and completed antibiotics for this less than 48 hours ago. The patient's husband is also concerned with the antianxiety medications that Ms. Reynolds is getting. She was on xanax PRN prior to admission but she is currently getting this scheduled TID  and he feels that it makes her sleepy. Her failure to wean from the ventilator could be multifactorial and not just related to the NG tube.  An open gastrostomy tube is not an urgent surgical intervention so I think that we should give her more time. The patient and her husband would also like to avoid a major abdominal operation if possible. Recommend switching large bore NG tube to Cortrak, get her further out from her pneumonia, and work on balancing anxiety without oversedating the patient. Please reach back out to 13/12/21 next week if still having issues.  [place], PA-C [facility] 04/10/2020, 1:44 PM Please see Amion for pager number during day hours 7:00am-4:30pm

## 2020-04-10 NOTE — Progress Notes (Signed)
Pulmonary Critical Care Medicine [facility]   PULMONARY CRITICAL CARE SERVICE  PROGRESS NOTE  Date of Service: 04/10/2020  Kathleen Cameron  CBS:[id number]  DOB: 1965/07/05   DOA: 03/08/2020  Referring Physician: Carron Curie, MD  HPI: Kathleen Cameron is a 54 y.o. female seen for follow up of Acute on Chronic Respiratory Failure.  Overnight patient had desaturations once again.  Now is back on the ventilator on pressure control mode  Medications: Reviewed on Rounds  Physical Exam:  Vitals: Temperature is 97.0 pulse 74 respiratory rate 17 blood pressure is 112/70 saturations 98%  Ventilator Settings on pressure control FiO2 65% PEEP 7 IP 22  . General: Comfortable at this time . Eyes: Grossly normal lids, irises & conjunctiva . ENT: grossly tongue is normal . Neck: no obvious mass . Cardiovascular: S1 S2 normal no gallop . Respiratory: No rhonchi no rales are noted at this time . Abdomen: soft . Skin: no rash seen on limited exam . Musculoskeletal: not rigid . Psychiatric:unable to assess . Neurologic: no seizure no involuntary movements         Lab Data:   Basic Metabolic Panel: Recent Labs  Lab 04/10/20 0327  NA 144  K 4.9  CL 97*  CO2 34*  GLUCOSE 174*  BUN 56*  CREATININE 0.49  CALCIUM 9.0    ABG: Recent Labs  Lab 04/07/20 0855 04/10/20 0743  PHART 7.377 7.373  PCO2ART 72.9* 71.0*  PO2ART 198* 150*  HCO3 42.4* 40.4*  O2SAT 99.7 99.6    Liver Function Tests: No results for input(s): AST, ALT, ALKPHOS, BILITOT, PROT, ALBUMIN in the last 168 hours. No results for input(s): LIPASE, AMYLASE in the last 168 hours. No results for input(s): AMMONIA in the last 168 hours.  CBC: Recent Labs  Lab 04/10/20 0327  WBC 13.1*  HGB 8.3*  HCT 27.8*  MCV 106.5*  PLT 121*    Cardiac Enzymes: No results for input(s): CKTOTAL, CKMB, CKMBINDEX, TROPONINI in the last 168 hours.  BNP (last 3 results) Recent Labs     09/06/19 1414 01/28/20 1503 02/25/20 1115  BNP 32.2 106.8* 282.7*    ProBNP (last 3 results) No results for input(s): PROBNP in the last 8760 hours.  Radiological Exams: DG Abd 1 View  Result Date: 04/09/2020 CLINICAL DATA:  Enteric catheter placement EXAM: ABDOMEN - 1 VIEW COMPARISON:  04/09/2020 FINDINGS: Frontal view of the lower chest and upper abdomen demonstrates enteric catheter passes below diaphragm, tip and side port projecting over gastric antrum. No significant change since prior study. Distended gas-filled loops of large and small bowel are unchanged. Patchy areas of consolidation at the lung bases are stable. IMPRESSION: 1. Stable enteric catheter overlying gastric antrum. Electronically Signed   By: Sharlet Salina M.D.   On: 04/09/2020 15:27   DG Abd Portable 1V  Result Date: 04/09/2020 CLINICAL DATA:  Enteric tube placement. EXAM: PORTABLE ABDOMEN - 1 VIEW COMPARISON:  Abdominal x-ray dated March 31, 2020. FINDINGS: Enteric tube in the distal stomach. Persistent mildly distended small bowel loops in the left upper quadrant. Continued patchy opacities throughout the left lung. IMPRESSION: 1. Enteric tube in the distal stomach. Electronically Signed   By: Obie Dredge M.D.   On: 04/09/2020 13:32    Assessment/Plan Active Problems:   Acute on chronic respiratory failure with hypoxia (HCC)   Seizure disorder (HCC)   COPD, severe (HCC)   Anxiety disorder   1. Acute on chronic respiratory failure hypoxia had issues with desaturations respiratory  therapy will reassess weaning currently on 65% FiO2 2. Seizure disorder no active seizure noted 3. Severe COPD at baseline 4. Anxiety under control continue to monitor   I have personally seen and evaluated the patient, evaluated laboratory and imaging results, formulated the assessment and plan and placed orders. The Patient requires high complexity decision making with multiple systems involvement.  Rounds were done  with the Respiratory Therapy Director and Staff therapists and discussed with nursing staff also.  Yevonne Pax, MD [facility] Pulmonary Critical Care Medicine Sleep Medicine

## 2020-04-11 DIAGNOSIS — J9621 Acute and chronic respiratory failure with hypoxia: Secondary | ICD-10-CM

## 2020-04-11 DIAGNOSIS — F419 Anxiety disorder, unspecified: Secondary | ICD-10-CM

## 2020-04-11 DIAGNOSIS — J449 Chronic obstructive pulmonary disease, unspecified: Secondary | ICD-10-CM

## 2020-04-11 DIAGNOSIS — G40909 Epilepsy, unspecified, not intractable, without status epilepticus: Secondary | ICD-10-CM

## 2020-04-11 NOTE — Progress Notes (Signed)
Pulmonary Critical Care Medicine [facility] [place]   PULMONARY CRITICAL CARE SERVICE  PROGRESS NOTE  Date of Service: 04/11/2020  Kathleen Cameron  [id number]:[id number]  DOB: [birth year]   DOA: 04/10/2020  Referring Physician: Carron Curie, MD  HPI: Kathleen Cameron is a 54 y.o. female seen for follow up of Acute on Chronic Respiratory Failure. Patient was taken down to the ER for evaluation by surgery for a PEG tube. The family and patient apparently right now did not want to consider doing the PEG tube  Medications: Reviewed on Rounds  Physical Exam:  Vitals: Temperature is 96.9 pulse 67 respiratory rate is 16 blood pressure is 126/76 saturations 100%  Ventilator Settings on pressure assist control FiO2 45% PEEP 5  . General: Comfortable at this time . Eyes: Grossly normal lids, irises & conjunctiva . ENT: grossly tongue is normal . Neck: no obvious mass . Cardiovascular: S1 S2 normal no gallop . Respiratory: No rhonchi no rales noted at this time . Abdomen: soft . Skin: no rash seen on limited exam . Musculoskeletal: not rigid . Psychiatric:unable to assess . Neurologic: no seizure no involuntary movements         Lab Data:   Basic Metabolic Panel: Recent Labs  Lab 04/10/20 0327  NA 144  K 4.9  CL 97*  CO2 34*  GLUCOSE 174*  BUN 56*  CREATININE 0.49  CALCIUM 9.0    ABG: Recent Labs  Lab 04/07/20 0855 04/10/20 0743  PHART 7.377 7.373  PCO2ART 72.9* 71.0*  PO2ART 198* 150*  HCO3 42.4* 40.4*  O2SAT 99.7 99.6    Liver Function Tests: No results for input(s): AST, ALT, ALKPHOS, BILITOT, PROT, ALBUMIN in the last 168 hours. No results for input(s): LIPASE, AMYLASE in the last 168 hours. No results for input(s): AMMONIA in the last 168 hours.  CBC: Recent Labs  Lab 04/10/20 0327  WBC 13.1*  HGB 8.3*  HCT 27.8*  MCV 106.5*  PLT 121*    Cardiac Enzymes: No results for input(s): CKTOTAL, CKMB, CKMBINDEX, TROPONINI in the last 168  hours.  BNP (last 3 results) Recent Labs    09/06/19 1414 01/28/20 1503 02/25/20 1115  BNP 32.2 106.8* 282.7*    ProBNP (last 3 results) No results for input(s): PROBNP in the last 8760 hours.  Radiological Exams: DG Abd 1 View  Result Date: 04/09/2020 CLINICAL DATA:  Enteric catheter placement EXAM: ABDOMEN - 1 VIEW COMPARISON:  04/09/2020 FINDINGS: Frontal view of the lower chest and upper abdomen demonstrates enteric catheter passes below diaphragm, tip and side port projecting over gastric antrum. No significant change since prior study. Distended gas-filled loops of large and small bowel are unchanged. Patchy areas of consolidation at the lung bases are stable. IMPRESSION: 1. Stable enteric catheter overlying gastric antrum. Electronically Signed   By: Sharlet Salina M.D.   On: 04/09/2020 15:27   DG Abd Portable 1V  Result Date: 04/09/2020 CLINICAL DATA:  Enteric tube placement. EXAM: PORTABLE ABDOMEN - 1 VIEW COMPARISON:  Abdominal x-ray dated March 31, 2020. FINDINGS: Enteric tube in the distal stomach. Persistent mildly distended small bowel loops in the left upper quadrant. Continued patchy opacities throughout the left lung. IMPRESSION: 1. Enteric tube in the distal stomach. Electronically Signed   By: Obie Dredge M.D.   On: 04/09/2020 13:32    Assessment/Plan Active Problems:   Acute on chronic respiratory failure with hypoxia (HCC)   Seizure disorder (HCC)   COPD, severe (HCC)   Anxiety disorder  1. Acute on chronic respiratory failure hypoxia we'll continue with full support on the ventilator. She is not been tolerating weaning well 2. Seizure disorder no active seizures 3. Severe COPD medical management 4. Anxiety disorder continue with supportive care   I have personally seen and evaluated the patient, evaluated laboratory and imaging results, formulated the assessment and plan and placed orders. The Patient requires high complexity decision making with  multiple systems involvement.  Rounds were done with the Respiratory Therapy Director and Staff therapists and discussed with nursing staff also.  Yevonne Pax, MD [facility] Pulmonary Critical Care Medicine Sleep Medicine

## 2020-04-12 NOTE — Progress Notes (Signed)
Pulmonary Critical Care Medicine [facility] [place]   PULMONARY CRITICAL CARE SERVICE  PROGRESS NOTE  Date of Service: 04/12/2020  Kathleen Cameron  [id number]  DOB: 05-21-1965   DOA: 04/10/2020  Referring Physician: Carron Curie, MD  HPI: Kathleen Cameron is a 54 y.o. female seen for follow up of Acute on Chronic Respiratory Failure.  Currently is on assist control mode has been on 45% FiO2 should be able to try pressure support today  Medications: Reviewed on Rounds  Physical Exam:  Vitals: Temperature is 97.6 pulse 89 respiratory rate 30 blood pressure is 137/81 saturations 93%  Ventilator Settings on assist control FiO2 is 45% tidal line 350 PEEP 5  . General: Comfortable at this time . Eyes: Grossly normal lids, irises & conjunctiva . ENT: grossly tongue is normal . Neck: no obvious mass . Cardiovascular: S1 S2 normal no gallop . Respiratory: No rhonchi very coarse breath sounds . Abdomen: soft . Skin: no rash seen on limited exam . Musculoskeletal: not rigid . Psychiatric:unable to assess . Neurologic: no seizure no involuntary movements         Lab Data:   Basic Metabolic Panel: Recent Labs  Lab 04/10/20 0327  NA 144  K 4.9  CL 97*  CO2 34*  GLUCOSE 174*  BUN 56*  CREATININE 0.49  CALCIUM 9.0    ABG: Recent Labs  Lab 04/07/20 0855 04/10/20 0743  PHART 7.377 7.373  PCO2ART 72.9* 71.0*  PO2ART 198* 150*  HCO3 42.4* 40.4*  O2SAT 99.7 99.6    Liver Function Tests: No results for input(s): AST, ALT, ALKPHOS, BILITOT, PROT, ALBUMIN in the last 168 hours. No results for input(s): LIPASE, AMYLASE in the last 168 hours. No results for input(s): AMMONIA in the last 168 hours.  CBC: Recent Labs  Lab 04/10/20 0327  WBC 13.1*  HGB 8.3*  HCT 27.8*  MCV 106.5*  PLT 121*    Cardiac Enzymes: No results for input(s): CKTOTAL, CKMB, CKMBINDEX, TROPONINI in the last 168 hours.  BNP (last 3 results) Recent Labs     09/06/19 1414 01/28/20 1503 02/25/20 1115  BNP 32.2 106.8* 282.7*    ProBNP (last 3 results) No results for input(s): PROBNP in the last 8760 hours.  Radiological Exams: No results found.  Assessment/Plan Active Problems:   Acute on chronic respiratory failure with hypoxia (HCC)   Seizure disorder (HCC)   COPD, severe (HCC)   Anxiety disorder   1. Acute on chronic respiratory failure hypoxia we will continue with assist control mode for resting mode and wean on pressure support as tolerated 2. Seizure disorder no active seizures 3. Severe COPD at baseline 4. Anxiety disorder appears to be little bit better controlled   I have personally seen and evaluated the patient, evaluated laboratory and imaging results, formulated the assessment and plan and placed orders. The Patient requires high complexity decision making with multiple systems involvement.  Rounds were done with the Respiratory Therapy Director and Staff therapists and discussed with nursing staff also.  Yevonne Pax, MD [facility] Pulmonary Critical Care Medicine Sleep Medicine

## 2020-04-13 NOTE — Progress Notes (Signed)
Pulmonary Critical Care Medicine [facility] [place]   PULMONARY CRITICAL CARE SERVICE  PROGRESS NOTE  Date of Service: 04/13/2020  [name]  [id number]  DOB: 12-16-1965   DOA: 04/10/2020  Referring Physician: Carron Curie, MD  HPI: Kathleen Cameron is a 54 y.o. female seen for follow up of Acute on Chronic Respiratory Failure.  Patient is on pressure support currently on 12/5 good saturations are noted  Medications: Reviewed on Rounds  Physical Exam:  Vitals: Temperature is 96.3 pulse 91 respiratory 22 blood pressure is 116/75 saturations 99%  Ventilator Settings on pressure support 12/5  . General: Comfortable at this time . Eyes: Grossly normal lids, irises & conjunctiva . ENT: grossly tongue is normal . Neck: no obvious mass . Cardiovascular: S1 S2 normal no gallop . Respiratory: Rhonchi no rales . Abdomen: soft . Skin: no rash seen on limited exam . Musculoskeletal: not rigid . Psychiatric:unable to assess . Neurologic: no seizure no involuntary movements         Lab Data:   Basic Metabolic Panel: Recent Labs  Lab 04/10/20 0327  NA 144  K 4.9  CL 97*  CO2 34*  GLUCOSE 174*  BUN 56*  CREATININE 0.49  CALCIUM 9.0    ABG: Recent Labs  Lab 04/07/20 0855 04/10/20 0743  PHART 7.377 7.373  PCO2ART 72.9* 71.0*  PO2ART 198* 150*  HCO3 42.4* 40.4*  O2SAT 99.7 99.6    Liver Function Tests: No results for input(s): AST, ALT, ALKPHOS, BILITOT, PROT, ALBUMIN in the last 168 hours. No results for input(s): LIPASE, AMYLASE in the last 168 hours. No results for input(s): AMMONIA in the last 168 hours.  CBC: Recent Labs  Lab 04/10/20 0327  WBC 13.1*  HGB 8.3*  HCT 27.8*  MCV 106.5*  PLT 121*    Cardiac Enzymes: No results for input(s): CKTOTAL, CKMB, CKMBINDEX, TROPONINI in the last 168 hours.  BNP (last 3 results) Recent Labs    09/06/19 1414 01/28/20 1503 02/25/20 1115  BNP 32.2 106.8* 282.7*    ProBNP (last  3 results) No results for input(s): PROBNP in the last 8760 hours.  Radiological Exams: No results found.  Assessment/Plan Active Problems:   Acute on chronic respiratory failure with hypoxia (HCC)   Seizure disorder (HCC)   COPD, severe (HCC)   Anxiety disorder   1. Acute on chronic respiratory failure hypoxia plan is to continue to wean if patient is able to tolerate today 2. Seizure disorder no active seizures 3. Severe COPD at baseline 4. Anxiety disorder no change   I have personally seen and evaluated the patient, evaluated laboratory and imaging results, formulated the assessment and plan and placed orders. The Patient requires high complexity decision making with multiple systems involvement.  Rounds were done with the Respiratory Therapy Director and Staff therapists and discussed with nursing staff also.  Yevonne Pax, MD [facility] Pulmonary Critical Care Medicine Sleep Medicine

## 2020-04-14 LAB — BASIC METABOLIC PANEL
Anion gap: 13 (ref 5–15)
BUN: 48 mg/dL — ABNORMAL HIGH (ref 6–20)
CO2: 35 mmol/L — ABNORMAL HIGH (ref 22–32)
Calcium: 9.1 mg/dL (ref 8.9–10.3)
Chloride: 102 mmol/L (ref 98–111)
Creatinine, Ser: 0.49 mg/dL (ref 0.44–1.00)
GFR, Estimated: >60 mL/min (ref >60)
Glucose, Bld: 160 mg/dL — ABNORMAL HIGH (ref 70–99)
Potassium: 4.4 mmol/L (ref 3.5–5.1)
Sodium: 150 mmol/L — ABNORMAL HIGH (ref 135–145)

## 2020-04-14 LAB — TSH: TSH: 1.038 u[IU]/mL (ref 0.350–4.500)

## 2020-04-14 NOTE — Progress Notes (Signed)
Pulmonary Critical Care Medicine [facility] [place]   PULMONARY CRITICAL CARE SERVICE  PROGRESS NOTE  Date of Service: 04/14/2020  Kathleen Cameron  [id number]  DOB: [birth year]   DOA: 04/10/2020  Referring Physician: Carron Curie, MD  HPI: Kathleen Cameron is a 54 y.o. female seen for follow up of Acute on Chronic Respiratory Failure.  Patient currently is on pressure support was able to do about 2 hours of T collar yesterday today will be trying for 6 hours  Medications: Reviewed on Rounds  Physical Exam:  Vitals: Temperature 98.5 pulse 70 respiratory 27 blood pressure is 132/78 saturations 100%  Ventilator Settings on pressure support FiO2 40% pressure 12/5  . General: Comfortable at this time . Eyes: Grossly normal lids, irises & conjunctiva . ENT: grossly tongue is normal . Neck: no obvious mass . Cardiovascular: S1 S2 normal no gallop . Respiratory: Scattered coarse rhonchi noted bilaterally . Abdomen: soft . Skin: no rash seen on limited exam . Musculoskeletal: not rigid . Psychiatric:unable to assess . Neurologic: no seizure no involuntary movements         Lab Data:   Basic Metabolic Panel: Recent Labs  Lab 04/10/20 0327 04/14/20 0304  NA 144 150*  K 4.9 4.4  CL 97* 102  CO2 34* 35*  GLUCOSE 174* 160*  BUN 56* 48*  CREATININE 0.49 0.49  CALCIUM 9.0 9.1    ABG: Recent Labs  Lab 04/10/20 0743  PHART 7.373  PCO2ART 71.0*  PO2ART 150*  HCO3 40.4*  O2SAT 99.6    Liver Function Tests: No results for input(s): AST, ALT, ALKPHOS, BILITOT, PROT, ALBUMIN in the last 168 hours. No results for input(s): LIPASE, AMYLASE in the last 168 hours. No results for input(s): AMMONIA in the last 168 hours.  CBC: Recent Labs  Lab 04/10/20 0327  WBC 13.1*  HGB 8.3*  HCT 27.8*  MCV 106.5*  PLT 121*    Cardiac Enzymes: No results for input(s): CKTOTAL, CKMB, CKMBINDEX, TROPONINI in the last 168 hours.  BNP (last 3 results) Recent  Labs    09/06/19 1414 01/28/20 1503 02/25/20 1115  BNP 32.2 106.8* 282.7*    ProBNP (last 3 results) No results for input(s): PROBNP in the last 8760 hours.  Radiological Exams: No results found.  Assessment/Plan Active Problems:   Acute on chronic respiratory failure with hypoxia (HCC)   Seizure disorder (HCC)   COPD, severe (HCC)   Anxiety disorder   1. Acute on chronic respiratory failure with hypoxia we will continue with pressure support and start with the T collar wean once again 2. Seizure disorder no active seizures 3. Severe COPD at baseline 4. Anxiety baseline controlled   I have personally seen and evaluated the patient, evaluated laboratory and imaging results, formulated the assessment and plan and placed orders. The Patient requires high complexity decision making with multiple systems involvement.  Rounds were done with the Respiratory Therapy Director and Staff therapists and discussed with nursing staff also.  Yevonne Pax, MD [facility] Pulmonary Critical Care Medicine Sleep Medicine

## 2020-04-15 NOTE — Progress Notes (Signed)
Pulmonary Critical Care Medicine [facility] [place]   PULMONARY CRITICAL CARE SERVICE  PROGRESS NOTE  Date of Service: 04/15/2020  Kathleen Cameron  [id number]  DOB: 1965-10-30   DOA: 04/10/2020  Referring Physician: Carron Curie, MD  HPI: Kathleen Cameron is a 54 y.o. female seen for follow up of Acute on Chronic Respiratory Failure.  Patient is on T collar currently on 40% FiO2 the goal is for 8 hours  Medications: Reviewed on Rounds  Physical Exam:  Vitals: Temperature is 96.8 pulse 82 respiratory rate 31 blood pressures 126/79 saturations 95%  Ventilator Settings off the ventilator on T collar currently on 40% FiO2  . General: Comfortable at this time . Eyes: Grossly normal lids, irises & conjunctiva . ENT: grossly tongue is normal . Neck: no obvious mass . Cardiovascular: S1 S2 normal no gallop . Respiratory: No rhonchi very coarse breath sounds . Abdomen: soft . Skin: no rash seen on limited exam . Musculoskeletal: not rigid . Psychiatric:unable to assess . Neurologic: no seizure no involuntary movements         Lab Data:   Basic Metabolic Panel: Recent Labs  Lab 04/10/20 0327 04/14/20 0304  NA 144 150*  K 4.9 4.4  CL 97* 102  CO2 34* 35*  GLUCOSE 174* 160*  BUN 56* 48*  CREATININE 0.49 0.49  CALCIUM 9.0 9.1    ABG: Recent Labs  Lab 04/10/20 0743  PHART 7.373  PCO2ART 71.0*  PO2ART 150*  HCO3 40.4*  O2SAT 99.6    Liver Function Tests: No results for input(s): AST, ALT, ALKPHOS, BILITOT, PROT, ALBUMIN in the last 168 hours. No results for input(s): LIPASE, AMYLASE in the last 168 hours. No results for input(s): AMMONIA in the last 168 hours.  CBC: Recent Labs  Lab 04/10/20 0327  WBC 13.1*  HGB 8.3*  HCT 27.8*  MCV 106.5*  PLT 121*    Cardiac Enzymes: No results for input(s): CKTOTAL, CKMB, CKMBINDEX, TROPONINI in the last 168 hours.  BNP (last 3 results) Recent Labs    09/06/19 1414 01/28/20 1503  02/25/20 1115  BNP 32.2 106.8* 282.7*    ProBNP (last 3 results) No results for input(s): PROBNP in the last 8760 hours.  Radiological Exams: No results found.  Assessment/Plan Active Problems:   Acute on chronic respiratory failure with hypoxia (HCC)   Seizure disorder (HCC)   COPD, severe (HCC)   Anxiety disorder   1. Acute on chronic respiratory failure with hypoxia we will continue with T-piece titrate oxygen continue pulmonary toilet. 2. Seizure disorder no active seizure noted at this time. 3. Severe COPD at baseline 4. Anxiety disorder we will continue with present management   I have personally seen and evaluated the patient, evaluated laboratory and imaging results, formulated the assessment and plan and placed orders. The Patient requires high complexity decision making with multiple systems involvement.  Rounds were done with the Respiratory Therapy Director and Staff therapists and discussed with nursing staff also.  Yevonne Pax, MD [facility] Pulmonary Critical Care Medicine Sleep Medicine

## 2020-04-16 LAB — BASIC METABOLIC PANEL
Anion gap: 9 (ref 5–15)
BUN: 39 mg/dL — ABNORMAL HIGH (ref 6–20)
CO2: 36 mmol/L — ABNORMAL HIGH (ref 22–32)
Calcium: 8.8 mg/dL — ABNORMAL LOW (ref 8.9–10.3)
Chloride: 98 mmol/L (ref 98–111)
Creatinine, Ser: 0.37 mg/dL — ABNORMAL LOW (ref 0.44–1.00)
GFR, Estimated: >60 mL/min (ref >60)
Glucose, Bld: 203 mg/dL — ABNORMAL HIGH (ref 70–99)
Potassium: 4.5 mmol/L (ref 3.5–5.1)
Sodium: 143 mmol/L (ref 135–145)

## 2020-04-16 LAB — CBC
HCT: 23.1 % — ABNORMAL LOW (ref 36.0–46.0)
Hemoglobin: 7 g/dL — ABNORMAL LOW (ref 12.0–15.0)
MCH: 32.1 pg (ref 26.0–34.0)
MCHC: 30.3 g/dL (ref 30.0–36.0)
MCV: 106 fL — ABNORMAL HIGH (ref 80.0–100.0)
Platelets: 126 10*3/uL — ABNORMAL LOW (ref 150–400)
RBC: 2.18 MIL/uL — ABNORMAL LOW (ref 3.87–5.11)
RDW: 18.7 % — ABNORMAL HIGH (ref 11.5–15.5)
WBC: 7.6 10*3/uL (ref 4.0–10.5)
nRBC: 0.9 % — ABNORMAL HIGH (ref 0.0–0.2)

## 2020-04-16 LAB — PREPARE RBC (CROSSMATCH)

## 2020-04-16 NOTE — Progress Notes (Signed)
Pulmonary Critical Care Medicine [facility] [place]   PULMONARY CRITICAL CARE SERVICE  PROGRESS NOTE  Date of Service: 04/16/2020  Kathleen Cameron  [id number]  DOB: 10-Sep-1965   DOA: 04/10/2020  Referring Physician: Carron Curie, MD  HPI: Kathleen Cameron is a 54 y.o. female seen for follow up of Acute on Chronic Respiratory Failure.  Currently is on full support on the ventilator is on assist control mode 40% FiO2.  Continues to not consistently tolerate weaning  Medications: Reviewed on Rounds  Physical Exam:  Vitals: Temperature is 97.3 pulse 89 respiratory 29 blood pressure 140/84 saturations 97  Ventilator Settings on assist control FiO2 40% tidal volume 350 with a PEEP of 5  . General: Comfortable at this time . Eyes: Grossly normal lids, irises & conjunctiva . ENT: grossly tongue is normal . Neck: no obvious mass . Cardiovascular: S1 S2 normal no gallop . Respiratory: No rhonchi very coarse breath sound . Abdomen: soft . Skin: no rash seen on limited exam . Musculoskeletal: not rigid . Psychiatric:unable to assess . Neurologic: no seizure no involuntary movements         Lab Data:   Basic Metabolic Panel: Recent Labs  Lab 04/10/20 0327 04/14/20 0304 04/16/20 0514  NA 144 150* 143  K 4.9 4.4 4.5  CL 97* 102 98  CO2 34* 35* 36*  GLUCOSE 174* 160* 203*  BUN 56* 48* 39*  CREATININE 0.49 0.49 0.37*  CALCIUM 9.0 9.1 8.8*    ABG: Recent Labs  Lab 04/10/20 0743  PHART 7.373  PCO2ART 71.0*  PO2ART 150*  HCO3 40.4*  O2SAT 99.6    Liver Function Tests: No results for input(s): AST, ALT, ALKPHOS, BILITOT, PROT, ALBUMIN in the last 168 hours. No results for input(s): LIPASE, AMYLASE in the last 168 hours. No results for input(s): AMMONIA in the last 168 hours.  CBC: Recent Labs  Lab 04/10/20 0327 04/16/20 0514  WBC 13.1* 7.6  HGB 8.3* 7.0*  HCT 27.8* 23.1*  MCV 106.5* 106.0*  PLT 121* 126*    Cardiac Enzymes: No  results for input(s): CKTOTAL, CKMB, CKMBINDEX, TROPONINI in the last 168 hours.  BNP (last 3 results) Recent Labs    09/06/19 1414 01/28/20 1503 02/25/20 1115  BNP 32.2 106.8* 282.7*    ProBNP (last 3 results) No results for input(s): PROBNP in the last 8760 hours.  Radiological Exams: No results found.  Assessment/Plan Active Problems:   Acute on chronic respiratory failure with hypoxia (HCC)   Seizure disorder (HCC)   COPD, severe (HCC)   Anxiety disorder   1. Acute on chronic respiratory failure hypoxia we will continue with full support on the ventilator. 2. Seizure disorder no active seizures 3. Severe COPD at baseline 4. Anxiety disorder   I have personally seen and evaluated the patient, evaluated laboratory and imaging results, formulated the assessment and plan and placed orders. The Patient requires high complexity decision making with multiple systems involvement.  Rounds were done with the Respiratory Therapy Director and Staff therapists and discussed with nursing staff also.  Kathleen Pax, MD [facility] Pulmonary Critical Care Medicine Sleep Medicine

## 2020-04-17 ENCOUNTER — Other Ambulatory Visit (HOSPITAL_COMMUNITY): Payer: Self-pay

## 2020-04-17 LAB — CBC
HCT: 30 % — ABNORMAL LOW (ref 36.0–46.0)
HCT: 29.9 % — ABNORMAL LOW (ref 36.0–46.0)
Hemoglobin: 9 g/dL — ABNORMAL LOW (ref 12.0–15.0)
Hemoglobin: 8.9 g/dL — ABNORMAL LOW (ref 12.0–15.0)
MCH: 31.1 pg (ref 26.0–34.0)
MCH: 31 pg (ref 26.0–34.0)
MCHC: 30 g/dL (ref 30.0–36.0)
MCHC: 29.8 g/dL — ABNORMAL LOW (ref 30.0–36.0)
MCV: 103.8 fL — ABNORMAL HIGH (ref 80.0–100.0)
MCV: 104.2 fL — ABNORMAL HIGH (ref 80.0–100.0)
Platelets: 105 10*3/uL — ABNORMAL LOW (ref 150–400)
Platelets: 123 10*3/uL — ABNORMAL LOW (ref 150–400)
RBC: 2.89 MIL/uL — ABNORMAL LOW (ref 3.87–5.11)
RBC: 2.87 MIL/uL — ABNORMAL LOW (ref 3.87–5.11)
RDW: 18.9 % — ABNORMAL HIGH (ref 11.5–15.5)
RDW: 18.8 % — ABNORMAL HIGH (ref 11.5–15.5)
WBC: 6.6 10*3/uL (ref 4.0–10.5)
WBC: 7.1 10*3/uL (ref 4.0–10.5)
nRBC: 1.1 % — ABNORMAL HIGH (ref 0.0–0.2)
nRBC: 1 % — ABNORMAL HIGH (ref 0.0–0.2)

## 2020-04-17 LAB — TYPE AND SCREEN
ABO/RH(D): O POS
Antibody Screen: NEGATIVE
Unit division: 0

## 2020-04-17 LAB — BPAM RBC
Blood Product Expiration Date: 202202012359
ISSUE DATE / TIME: 202112271508
Unit Type and Rh: 5100

## 2020-04-17 LAB — SARS CORONAVIRUS 2 (TAT 6-24 HRS): SARS Coronavirus 2: NEGATIVE

## 2020-04-17 NOTE — Progress Notes (Signed)
Pulmonary Critical Care Medicine [facility] [place]   PULMONARY CRITICAL CARE SERVICE  PROGRESS NOTE  Date of Service: 04/17/2020  Alizon Schmeling  [id number]  DOB: 1966-03-03   DOA: 04/10/2020  Referring Physician: Carron Curie, MD  HPI: Alexy Bringle is a 54 y.o. female seen for follow up of Acute on Chronic Respiratory Failure.  Patient currently is on assist control mode full support she is not tolerating weaning  Medications: Reviewed on Rounds  Physical Exam:  Vitals: Temperature is 98.5 pulse 66 respiratory 20 blood pressure is 158/85 saturations 97%  Ventilator Settings on assist control FiO2 is 40% tidal volume 350 PEEP 5  . General: Comfortable at this time . Eyes: Grossly normal lids, irises & conjunctiva . ENT: grossly tongue is normal . Neck: no obvious mass . Cardiovascular: S1 S2 normal no gallop . Respiratory: No rhonchi very coarse breath . Abdomen: soft . Skin: no rash seen on limited exam . Musculoskeletal: not rigid . Psychiatric:unable to assess . Neurologic: no seizure no involuntary movements         Lab Data:   Basic Metabolic Panel: Recent Labs  Lab 04/14/20 0304 04/16/20 0514  NA 150* 143  K 4.4 4.5  CL 102 98  CO2 35* 36*  GLUCOSE 160* 203*  BUN 48* 39*  CREATININE 0.49 0.37*  CALCIUM 9.1 8.8*    ABG: No results for input(s): PHART, PCO2ART, PO2ART, HCO3, O2SAT in the last 168 hours.  Liver Function Tests: No results for input(s): AST, ALT, ALKPHOS, BILITOT, PROT, ALBUMIN in the last 168 hours. No results for input(s): LIPASE, AMYLASE in the last 168 hours. No results for input(s): AMMONIA in the last 168 hours.  CBC: Recent Labs  Lab 04/16/20 0514 04/17/20 0607 04/17/20 0844  WBC 7.6 6.6 7.1  HGB 7.0* 9.0* 8.9*  HCT 23.1* 30.0* 29.9*  MCV 106.0* 103.8* 104.2*  PLT 126* 105* 123*    Cardiac Enzymes: No results for input(s): CKTOTAL, CKMB, CKMBINDEX, TROPONINI in the last 168 hours.  BNP  (last 3 results) Recent Labs    09/06/19 1414 01/28/20 1503 02/25/20 1115  BNP 32.2 106.8* 282.7*    ProBNP (last 3 results) No results for input(s): PROBNP in the last 8760 hours.  Radiological Exams: DG Chest Port 1 View  Result Date: 04/17/2020 CLINICAL DATA:  54 year old female with COPD. EXAM: PORTABLE CHEST 1 VIEW COMPARISON:  Portable chest 03/31/2020 and earlier. FINDINGS: Portable AP upright view at 0858 hours. Tracheostomy tube remains in place, projects over the tracheal air column. Enteric tube remains in place and courses to the abdomen, tip not included. Large lung volumes. Tortuous thoracic aorta. Other mediastinal contours are within normal limits. Asymmetric increased reticulonodular opacity throughout the left lung, new from earlier this month. No pneumothorax or definite effusion. The right lung appears stable. Stable visualized osseous structures. IMPRESSION: 1. Chronic pulmonary hyperinflation with new reticulonodular opacity throughout the left lung. Favor viral/atypical acute infectious exacerbation. No definite pleural effusion. 2. Stable tracheostomy, visible enteric tube. Electronically Signed   By: Odessa Fleming M.D.   On: 04/17/2020 09:08    Assessment/Plan Active Problems:   Acute on chronic respiratory failure with hypoxia (HCC)   Seizure disorder (HCC)   COPD, severe (HCC)   Anxiety disorder   1. Acute on chronic respiratory failure hypoxia we will continue with full support on assist control titrate oxygen continue pulmonary toilet. 2. Seizure disorder no change 3. Severe COPD at baseline 4. Anxiety disorder she is at her baseline  appears to be better controlled   I have personally seen and evaluated the patient, evaluated laboratory and imaging results, formulated the assessment and plan and placed orders. The Patient requires high complexity decision making with multiple systems involvement.  Rounds were done with the Respiratory Therapy Director and  Staff therapists and discussed with nursing staff also.  Yevonne Pax, MD [facility] Pulmonary Critical Care Medicine Sleep Medicine

## 2020-04-18 LAB — CBC
HCT: 23.8 % — ABNORMAL LOW (ref 36.0–46.0)
Hemoglobin: 7.4 g/dL — ABNORMAL LOW (ref 12.0–15.0)
MCH: 32.3 pg (ref 26.0–34.0)
MCHC: 31.1 g/dL (ref 30.0–36.0)
MCV: 103.9 fL — ABNORMAL HIGH (ref 80.0–100.0)
Platelets: 108 10*3/uL — ABNORMAL LOW (ref 150–400)
RBC: 2.29 MIL/uL — ABNORMAL LOW (ref 3.87–5.11)
RDW: 18.8 % — ABNORMAL HIGH (ref 11.5–15.5)
WBC: 6.3 10*3/uL (ref 4.0–10.5)
nRBC: 0.8 % — ABNORMAL HIGH (ref 0.0–0.2)

## 2020-04-18 LAB — BPAM FFP
Blood Product Expiration Date: 202112312359
Blood Product Expiration Date: 202112312359
ISSUE DATE / TIME: 202112281607
ISSUE DATE / TIME: 202112281756
Unit Type and Rh: 5100
Unit Type and Rh: 5100

## 2020-04-18 LAB — PREPARE FRESH FROZEN PLASMA
Unit division: 0
Unit division: 0

## 2020-04-18 NOTE — Progress Notes (Signed)
Pulmonary Critical Care Medicine [facility] [place]   PULMONARY CRITICAL CARE SERVICE  PROGRESS NOTE  Date of Service: 04/18/2020  Kathleen Cameron  [id number]:[id number]  DOB: [birth year]   DOA: 04/10/2020  Referring Physician: Carron Curie, MD  HPI: Kathleen Cameron is a 54 y.o. female seen for follow up of Acute on Chronic Respiratory Failure.  Patient is on full support on assist control mode currently is on 40% FiO2  Medications: Reviewed on Rounds  Physical Exam:  Vitals: Temperature is 97.5 pulse 76 respiratory 25 blood pressure is 125/81 saturations 94  Ventilator Settings on assist control FiO2 is 40% tidal volume 350 PEEP five  . General: Comfortable at this time . Eyes: Grossly normal lids, irises & conjunctiva . ENT: grossly tongue is normal . Neck: no obvious mass . Cardiovascular: S1 S2 normal no gallop . Respiratory: No rhonchi no rales noted at this time . Abdomen: soft . Skin: no rash seen on limited exam . Musculoskeletal: not rigid . Psychiatric:unable to assess . Neurologic: no seizure no involuntary movements         Lab Data:   Basic Metabolic Panel: Recent Labs  Lab 04/14/20 0304 04/16/20 0514  NA 150* 143  K 4.4 4.5  CL 102 98  CO2 35* 36*  GLUCOSE 160* 203*  BUN 48* 39*  CREATININE 0.49 0.37*  CALCIUM 9.1 8.8*    ABG: No results for input(s): PHART, PCO2ART, PO2ART, HCO3, O2SAT in the last 168 hours.  Liver Function Tests: No results for input(s): AST, ALT, ALKPHOS, BILITOT, PROT, ALBUMIN in the last 168 hours. No results for input(s): LIPASE, AMYLASE in the last 168 hours. No results for input(s): AMMONIA in the last 168 hours.  CBC: Recent Labs  Lab 04/16/20 0514 04/17/20 0607 04/17/20 0844 04/18/20 0510  WBC 7.6 6.6 7.1 6.3  HGB 7.0* 9.0* 8.9* 7.4*  HCT 23.1* 30.0* 29.9* 23.8*  MCV 106.0* 103.8* 104.2* 103.9*  PLT 126* 105* 123* 108*    Cardiac Enzymes: No results for input(s): CKTOTAL, CKMB, CKMBINDEX,  TROPONINI in the last 168 hours.  BNP (last 3 results) Recent Labs    09/06/19 1414 01/28/20 1503 02/25/20 1115  BNP 32.2 106.8* 282.7*    ProBNP (last 3 results) No results for input(s): PROBNP in the last 8760 hours.  Radiological Exams: DG Chest Port 1 View  Result Date: 04/17/2020 CLINICAL DATA:  54 year old female with COPD. EXAM: PORTABLE CHEST 1 VIEW COMPARISON:  Portable chest 03/31/2020 and earlier. FINDINGS: Portable AP upright view at 0858 hours. Tracheostomy tube remains in place, projects over the tracheal air column. Enteric tube remains in place and courses to the abdomen, tip not included. Large lung volumes. Tortuous thoracic aorta. Other mediastinal contours are within normal limits. Asymmetric increased reticulonodular opacity throughout the left lung, new from earlier this month. No pneumothorax or definite effusion. The right lung appears stable. Stable visualized osseous structures. IMPRESSION: 1. Chronic pulmonary hyperinflation with new reticulonodular opacity throughout the left lung. Favor viral/atypical acute infectious exacerbation. No definite pleural effusion. 2. Stable tracheostomy, visible enteric tube. Electronically Signed   By: Odessa Fleming M.D.   On: 04/17/2020 09:08    Assessment/Plan Active Problems:   Acute on chronic respiratory failure with hypoxia (HCC)   Seizure disorder (HCC)   COPD, severe (HCC)   Anxiety disorder   1. Acute on chronic respiratory failure hypoxia patient is on assist control currently on 40% FiO2 we will continue with assist control 2. Seizure disorder no active seizure noted  at this time. 3. Severe COPD at baseline 4. Anxiety disorder no change   I have personally seen and evaluated the patient, evaluated laboratory and imaging results, formulated the assessment and plan and placed orders. The Patient requires high complexity decision making with multiple systems involvement.  Rounds were done with the Respiratory Therapy  Director and Staff therapists and discussed with nursing staff also.  Yevonne Pax, MD [facility] Pulmonary Critical Care Medicine Sleep Medicine

## 2020-04-19 NOTE — Progress Notes (Signed)
Pulmonary Critical Care Medicine [facility] [place]   PULMONARY CRITICAL CARE SERVICE  PROGRESS NOTE  Date of Service: 04/19/2020  Kathleen Cameron  [id number]:[id number]  DOB: 27-Nov-1965   DOA: 04/10/2020  Referring Physician: Carron Curie, MD  HPI: Kathleen Cameron is a 54 y.o. female seen for follow up of Acute on Chronic Respiratory Failure.  Patient is on assist control mode was attempted on weaning but did not tolerate today  Medications: Reviewed on Rounds  Physical Exam:  Vitals: Temperature is 98.3 pulse 75 respiratory 20 blood pressure is 107/68 saturations 97%  Ventilator Settings on assist control FiO2 45% tidal line 350 PEEP 5  . General: Comfortable at this time . Eyes: Grossly normal lids, irises & conjunctiva . ENT: grossly tongue is normal . Neck: no obvious mass . Cardiovascular: S1 S2 normal no gallop . Respiratory: No rhonchi no rales noted at this time . Abdomen: soft . Skin: no rash seen on limited exam . Musculoskeletal: not rigid . Psychiatric:unable to assess . Neurologic: no seizure no involuntary movements         Lab Data:   Basic Metabolic Panel: Recent Labs  Lab 04/14/20 0304 04/16/20 0514  NA 150* 143  K 4.4 4.5  CL 102 98  CO2 35* 36*  GLUCOSE 160* 203*  BUN 48* 39*  CREATININE 0.49 0.37*  CALCIUM 9.1 8.8*    ABG: No results for input(s): PHART, PCO2ART, PO2ART, HCO3, O2SAT in the last 168 hours.  Liver Function Tests: No results for input(s): AST, ALT, ALKPHOS, BILITOT, PROT, ALBUMIN in the last 168 hours. No results for input(s): LIPASE, AMYLASE in the last 168 hours. No results for input(s): AMMONIA in the last 168 hours.  CBC: Recent Labs  Lab 04/16/20 0514 04/17/20 0607 04/17/20 0844 04/18/20 0510  WBC 7.6 6.6 7.1 6.3  HGB 7.0* 9.0* 8.9* 7.4*  HCT 23.1* 30.0* 29.9* 23.8*  MCV 106.0* 103.8* 104.2* 103.9*  PLT 126* 105* 123* 108*    Cardiac Enzymes: No results for input(s): CKTOTAL, CKMB,  CKMBINDEX, TROPONINI in the last 168 hours.  BNP (last 3 results) Recent Labs    09/06/19 1414 01/28/20 1503 02/25/20 1115  BNP 32.2 106.8* 282.7*    ProBNP (last 3 results) No results for input(s): PROBNP in the last 8760 hours.  Radiological Exams: No results found.  Assessment/Plan Active Problems:   Acute on chronic respiratory failure with hypoxia (HCC)   Seizure disorder (HCC)   COPD, severe (HCC)   Anxiety disorder   1. Acute on chronic respiratory failure hypoxia we will continue with full support on assist control titrate oxygen continue pulmonary toilet. 2. Seizure disorder no active seizures noted 3. Severe COPD at baseline 4. Anxiety disorder no change   I have personally seen and evaluated the patient, evaluated laboratory and imaging results, formulated the assessment and plan and placed orders. The Patient requires high complexity decision making with multiple systems involvement.  Rounds were done with the Respiratory Therapy Director and Staff therapists and discussed with nursing staff also.  Yevonne Pax, MD [facility] Pulmonary Critical Care Medicine Sleep Medicine

## 2020-04-20 NOTE — Progress Notes (Signed)
Pulmonary Critical Care Medicine [facility] [place]   PULMONARY CRITICAL CARE SERVICE  PROGRESS NOTE  Date of Service: 04/20/2020  Kathleen Cameron  [id number]:[id number]  DOB: 1965-05-06   DOA: 04/10/2020  Referring Physician: Carron Curie, MD  HPI: Kathleen Cameron is a 54 y.o. female seen for follow up of Acute on Chronic Respiratory Failure.  Patient currently is on full support on assist control mode has been on PEEP 5 and requiring 45% FiO2  Medications: Reviewed on Rounds  Physical Exam:  Vitals: Temperature is 97.0 pulse 75 respiratory rate is 40 blood pressure is 136/76 saturations 98%  Ventilator Settings on the ventilator and assist control FiO2 45% tidal volume is 350 with a PEEP of 5  . General: Comfortable at this time . Eyes: Grossly normal lids, irises & conjunctiva . ENT: grossly tongue is normal . Neck: no obvious mass . Cardiovascular: S1 S2 normal no gallop . Respiratory: Scattered rhonchi very coarse breath sounds . Abdomen: soft . Skin: no rash seen on limited exam . Musculoskeletal: not rigid . Psychiatric:unable to assess . Neurologic: no seizure no involuntary movements         Lab Data:   Basic Metabolic Panel: Recent Labs  Lab 04/14/20 0304 04/16/20 0514  NA 150* 143  K 4.4 4.5  CL 102 98  CO2 35* 36*  GLUCOSE 160* 203*  BUN 48* 39*  CREATININE 0.49 0.37*  CALCIUM 9.1 8.8*    ABG: No results for input(s): PHART, PCO2ART, PO2ART, HCO3, O2SAT in the last 168 hours.  Liver Function Tests: No results for input(s): AST, ALT, ALKPHOS, BILITOT, PROT, ALBUMIN in the last 168 hours. No results for input(s): LIPASE, AMYLASE in the last 168 hours. No results for input(s): AMMONIA in the last 168 hours.  CBC: Recent Labs  Lab 04/16/20 0514 04/17/20 0607 04/17/20 0844 04/18/20 0510  WBC 7.6 6.6 7.1 6.3  HGB 7.0* 9.0* 8.9* 7.4*  HCT 23.1* 30.0* 29.9* 23.8*  MCV 106.0* 103.8* 104.2* 103.9*  PLT 126* 105* 123* 108*     Cardiac Enzymes: No results for input(s): CKTOTAL, CKMB, CKMBINDEX, TROPONINI in the last 168 hours.  BNP (last 3 results) Recent Labs    09/06/19 1414 01/28/20 1503 02/25/20 1115  BNP 32.2 106.8* 282.7*    ProBNP (last 3 results) No results for input(s): PROBNP in the last 8760 hours.  Radiological Exams: No results found.  Assessment/Plan Active Problems:   Acute on chronic respiratory failure with hypoxia (HCC)   Seizure disorder (HCC)   COPD, severe (HCC)   Anxiety disorder   1. Acute on chronic respiratory failure hypoxia we will continue with assist control mode on 45% FiO2. 2. Seizure disorder no changes noted we will continue to follow 3. Severe COPD at baseline 4. Anxiety disorder at baseline we will continue to follow   I have personally seen and evaluated the patient, evaluated laboratory and imaging results, formulated the assessment and plan and placed orders. The Patient requires high complexity decision making with multiple systems involvement.  Rounds were done with the Respiratory Therapy Director and Staff therapists and discussed with nursing staff also.  Yevonne Pax, MD [facility] Pulmonary Critical Care Medicine Sleep Medicine

## 2020-04-21 NOTE — Progress Notes (Signed)
Pulmonary Critical Care Medicine [facility] [place]   PULMONARY CRITICAL CARE SERVICE  PROGRESS NOTE  Date of Service: 04/21/2020  Clotilde Loth  [id number]  DOB: 02-25-1966   DOA: 04/10/2020  Referring Physician: Carron Curie, MD  HPI: Kathleen Cameron is a 55 y.o. female seen for follow up of Acute on Chronic Respiratory Failure.  Patient currently is on full support attempted on pressure support but did not tolerate  Medications: Reviewed on Rounds  Physical Exam:  Vitals: Temperature 97.0 pulse 73 respiratory 23 blood pressure is 135/92 saturations 99%  Ventilator Settings on pressure support failed now on assist control FiO2 45% tidal volume is 450 with a PEEP of 5  . General: Comfortable at this time . Eyes: Grossly normal lids, irises & conjunctiva . ENT: grossly tongue is normal . Neck: no obvious mass . Cardiovascular: S1 S2 normal no gallop . Respiratory: No rhonchi no rales . Abdomen: soft . Skin: no rash seen on limited exam . Musculoskeletal: not rigid . Psychiatric:unable to assess . Neurologic: no seizure no involuntary movements         Lab Data:   Basic Metabolic Panel: Recent Labs  Lab 04/16/20 0514  NA 143  K 4.5  CL 98  CO2 36*  GLUCOSE 203*  BUN 39*  CREATININE 0.37*  CALCIUM 8.8*    ABG: No results for input(s): PHART, PCO2ART, PO2ART, HCO3, O2SAT in the last 168 hours.  Liver Function Tests: No results for input(s): AST, ALT, ALKPHOS, BILITOT, PROT, ALBUMIN in the last 168 hours. No results for input(s): LIPASE, AMYLASE in the last 168 hours. No results for input(s): AMMONIA in the last 168 hours.  CBC: Recent Labs  Lab 04/16/20 0514 04/17/20 0607 04/17/20 0844 04/18/20 0510  WBC 7.6 6.6 7.1 6.3  HGB 7.0* 9.0* 8.9* 7.4*  HCT 23.1* 30.0* 29.9* 23.8*  MCV 106.0* 103.8* 104.2* 103.9*  PLT 126* 105* 123* 108*    Cardiac Enzymes: No results for input(s): CKTOTAL, CKMB, CKMBINDEX, TROPONINI in the last  168 hours.  BNP (last 3 results) Recent Labs    09/06/19 1414 01/28/20 1503 02/25/20 1115  BNP 32.2 106.8* 282.7*    ProBNP (last 3 results) No results for input(s): PROBNP in the last 8760 hours.  Radiological Exams: No results found.  Assessment/Plan Active Problems:   Acute on chronic respiratory failure with hypoxia (HCC)   Seizure disorder (HCC)   COPD, severe (HCC)   Anxiety disorder   1. Acute on chronic respiratory failure hypoxia we will continue with full support assist control mode 2. Seizure disorder no active seizures noted at this time. 3. Severe COPD at baseline 4. Anxiety disorder no change   I have personally seen and evaluated the patient, evaluated laboratory and imaging results, formulated the assessment and plan and placed orders. The Patient requires high complexity decision making with multiple systems involvement.  Rounds were done with the Respiratory Therapy Director and Staff therapists and discussed with nursing staff also.  Yevonne Pax, MD [facility] Pulmonary Critical Care Medicine Sleep Medicine

## 2020-04-22 NOTE — Progress Notes (Signed)
Pulmonary Critical Care Medicine [facility] [place]   PULMONARY CRITICAL CARE SERVICE  PROGRESS NOTE  Date of Service: 04/22/2020  Prachi Oftedahl  [id number]:[id number]  DOB: [birth year]   DOA: 04/10/2020  Referring Physician: Carron Curie, MD  HPI: Jennye Runquist is a 55 y.o. female seen for follow up of Acute on Chronic Respiratory Failure.  Was attempted on pressure support did not tolerate back on full support on assist control rate  Medications: Reviewed on Rounds  Physical Exam:  Vitals: Temperature is 97.6 pulse 92 respiratory rate 30 blood pressure is 123/79 saturations 93%  Ventilator Settings on assist control FiO2 is 40% tidal volume 350 PEEP 5  . General: Comfortable at this time . Eyes: Grossly normal lids, irises & conjunctiva . ENT: grossly tongue is normal . Neck: no obvious mass . Cardiovascular: S1 S2 normal no gallop . Respiratory: No rhonchi no rales noted at this time . Abdomen: soft . Skin: no rash seen on limited exam . Musculoskeletal: not rigid . Psychiatric:unable to assess . Neurologic: no seizure no involuntary movements         Lab Data:   Basic Metabolic Panel: Recent Labs  Lab 04/16/20 0514  NA 143  K 4.5  CL 98  CO2 36*  GLUCOSE 203*  BUN 39*  CREATININE 0.37*  CALCIUM 8.8*    ABG: No results for input(s): PHART, PCO2ART, PO2ART, HCO3, O2SAT in the last 168 hours.  Liver Function Tests: No results for input(s): AST, ALT, ALKPHOS, BILITOT, PROT, ALBUMIN in the last 168 hours. No results for input(s): LIPASE, AMYLASE in the last 168 hours. No results for input(s): AMMONIA in the last 168 hours.  CBC: Recent Labs  Lab 04/16/20 0514 04/17/20 0607 04/17/20 0844 04/18/20 0510  WBC 7.6 6.6 7.1 6.3  HGB 7.0* 9.0* 8.9* 7.4*  HCT 23.1* 30.0* 29.9* 23.8*  MCV 106.0* 103.8* 104.2* 103.9*  PLT 126* 105* 123* 108*    Cardiac Enzymes: No results for input(s): CKTOTAL, CKMB, CKMBINDEX, TROPONINI in the last 168  hours.  BNP (last 3 results) Recent Labs    09/06/19 1414 01/28/20 1503 02/25/20 1115  BNP 32.2 106.8* 282.7*    ProBNP (last 3 results) No results for input(s): PROBNP in the last 8760 hours.  Radiological Exams: No results found.  Assessment/Plan Active Problems:   Acute on chronic respiratory failure with hypoxia (HCC)   Seizure disorder (HCC)   COPD, severe (HCC)   Anxiety disorder   1. Acute on chronic respiratory failure hypoxia we will continue with assist control FiO2 40% tidal line 350 PEEP of 5 2. Seizure disorder no active seizures noted at this time 3. Severe COPD patient is at baseline 4. Anxiety disorder supportive care   I have personally seen and evaluated the patient, evaluated laboratory and imaging results, formulated the assessment and plan and placed orders. The Patient requires high complexity decision making with multiple systems involvement.  Rounds were done with the Respiratory Therapy Director and Staff therapists and discussed with nursing staff also.  Yevonne Pax, MD [facility] Pulmonary Critical Care Medicine Sleep Medicine

## 2020-04-23 ENCOUNTER — Encounter (HOSPITAL_COMMUNITY): Payer: Self-pay | Admitting: Certified Registered"

## 2020-04-23 ENCOUNTER — Other Ambulatory Visit (INDEPENDENT_AMBULATORY_CARE_PROVIDER_SITE_OTHER): Payer: Self-pay | Admitting: Physician Assistant

## 2020-04-23 ENCOUNTER — Other Ambulatory Visit (HOSPITAL_COMMUNITY): Payer: Self-pay

## 2020-04-23 LAB — CBC
HCT: 24.5 % — ABNORMAL LOW (ref 36.0–46.0)
Hemoglobin: 7.8 g/dL — ABNORMAL LOW (ref 12.0–15.0)
MCH: 33.1 pg (ref 26.0–34.0)
MCHC: 31.8 g/dL (ref 30.0–36.0)
MCV: 103.8 fL — ABNORMAL HIGH (ref 80.0–100.0)
Platelets: 138 10*3/uL — ABNORMAL LOW (ref 150–400)
RBC: 2.36 MIL/uL — ABNORMAL LOW (ref 3.87–5.11)
RDW: 17.6 % — ABNORMAL HIGH (ref 11.5–15.5)
WBC: 6.8 10*3/uL (ref 4.0–10.5)
nRBC: 0.7 % — ABNORMAL HIGH (ref 0.0–0.2)

## 2020-04-23 LAB — BASIC METABOLIC PANEL
Anion gap: 10 (ref 5–15)
BUN: 44 mg/dL — ABNORMAL HIGH (ref 6–20)
CO2: 42 mmol/L — ABNORMAL HIGH (ref 22–32)
Calcium: 9 mg/dL (ref 8.9–10.3)
Chloride: 89 mmol/L — ABNORMAL LOW (ref 98–111)
Creatinine, Ser: 0.45 mg/dL (ref 0.44–1.00)
GFR, Estimated: >60 mL/min (ref >60)
Glucose, Bld: 170 mg/dL — ABNORMAL HIGH (ref 70–99)
Potassium: 4.4 mmol/L (ref 3.5–5.1)
Sodium: 141 mmol/L (ref 135–145)

## 2020-04-23 LAB — BLOOD GAS, ARTERIAL
Acid-Base Excess: 17.8 mmol/L — ABNORMAL HIGH (ref 0.0–2.0)
Bicarbonate: 44.9 mmol/L — ABNORMAL HIGH (ref 20.0–28.0)
FIO2: 45
O2 Saturation: 98.6 %
Patient temperature: 36.1
pCO2 arterial: 90.1 mmHg (ref 32.0–48.0)
pH, Arterial: 7.313 — ABNORMAL LOW (ref 7.350–7.450)
pO2, Arterial: 114 mmHg — ABNORMAL HIGH (ref 83.0–108.0)

## 2020-04-23 LAB — TSH: TSH: 1.642 u[IU]/mL (ref 0.350–4.500)

## 2020-04-23 NOTE — Progress Notes (Signed)
Pulmonary Critical Care Medicine [facility] [place]   PULMONARY CRITICAL CARE SERVICE  PROGRESS NOTE  Date of Service: 04/23/2020  Kathleen Cameron  [id number]  DOB: 31-Jan-1966   DOA: 04/10/2020  Referring Physician: Carron Curie, MD  HPI: Kathleen Cameron is a 55 y.o. female seen for follow up of Acute on Chronic Respiratory Failure.  Patient currently is on assist control mode has been on 45% FiO2 good saturations are noted  Medications: Reviewed on Rounds  Physical Exam:  Vitals: Temperature is 96.3 pulse 74 respiratory 23 blood pressure is 170/97 saturations 92%  Ventilator Settings on assist control FiO2 is 45% tidal volume 350 PEEP 5  . General: Comfortable at this time . Eyes: Grossly normal lids, irises & conjunctiva . ENT: grossly tongue is normal . Neck: no obvious mass . Cardiovascular: S1 S2 normal no gallop . Respiratory: No rhonchi no rales noted at this time . Abdomen: soft . Skin: no rash seen on limited exam . Musculoskeletal: not rigid . Psychiatric:unable to assess . Neurologic: no seizure no involuntary movements         Lab Data:   Basic Metabolic Panel: No results for input(s): NA, K, CL, CO2, GLUCOSE, BUN, CREATININE, CALCIUM, MG, PHOS in the last 168 hours.  ABG: No results for input(s): PHART, PCO2ART, PO2ART, HCO3, O2SAT in the last 168 hours.  Liver Function Tests: No results for input(s): AST, ALT, ALKPHOS, BILITOT, PROT, ALBUMIN in the last 168 hours. No results for input(s): LIPASE, AMYLASE in the last 168 hours. No results for input(s): AMMONIA in the last 168 hours.  CBC: Recent Labs  Lab 04/17/20 0607 04/17/20 0844 04/18/20 0510  WBC 6.6 7.1 6.3  HGB 9.0* 8.9* 7.4*  HCT 30.0* 29.9* 23.8*  MCV 103.8* 104.2* 103.9*  PLT 105* 123* 108*    Cardiac Enzymes: No results for input(s): CKTOTAL, CKMB, CKMBINDEX, TROPONINI in the last 168 hours.  BNP (last 3 results) Recent Labs    09/06/19 1414  01/28/20 1503 02/25/20 1115  BNP 32.2 106.8* 282.7*    ProBNP (last 3 results) No results for input(s): PROBNP in the last 8760 hours.  Radiological Exams: No results found.  Assessment/Plan Active Problems:   Acute on chronic respiratory failure with hypoxia (HCC)   Seizure disorder (HCC)   COPD, severe (HCC)   Anxiety disorder   1. Acute on chronic respiratory failure hypoxia we will continue with assist control titrate oxygen continue pulmonary toilet. 2. Seizure disorder no active seizure noted 3. Severe COPD at baseline 4. Anxiety disorder patient is at baseline we will continue to follow along   I have personally seen and evaluated the patient, evaluated laboratory and imaging results, formulated the assessment and plan and placed orders. The Patient requires high complexity decision making with multiple systems involvement.  Rounds were done with the Respiratory Therapy Director and Staff therapists and discussed with nursing staff also.  Yevonne Pax, MD [facility] Pulmonary Critical Care Medicine Sleep Medicine

## 2020-04-24 ENCOUNTER — Encounter
Admission: AD | Disposition: A | Discharge status: Home Health | Payer: Self-pay | Source: Other Acute Inpatient Hospital | Attending: Internal Medicine

## 2020-04-24 ENCOUNTER — Institutional Professional Consult (permissible substitution) (HOSPITAL_COMMUNITY): Payer: Self-pay | Admitting: Certified Registered"

## 2020-04-24 ENCOUNTER — Inpatient Hospital Stay: Admit: 2020-04-24 | Payer: PRIVATE HEALTH INSURANCE | Admitting: General Surgery

## 2020-04-24 HISTORY — PX: LAPAROSCOPIC INSERTION GASTROSTOMY TUBE: SHX6817

## 2020-04-24 LAB — BLOOD GAS, ARTERIAL
Acid-Base Excess: 19.4 mmol/L — ABNORMAL HIGH (ref 0.0–2.0)
Bicarbonate: 44.9 mmol/L — ABNORMAL HIGH (ref 20.0–28.0)
FIO2: 35
O2 Saturation: 95.2 %
Patient temperature: 36.9
pCO2 arterial: 63.9 mmHg — ABNORMAL HIGH (ref 32.0–48.0)
pH, Arterial: 7.46 — ABNORMAL HIGH (ref 7.350–7.450)
pO2, Arterial: 70.2 mmHg — ABNORMAL LOW (ref 83.0–108.0)

## 2020-04-24 SURGERY — INSERTION, GASTROSTOMY TUBE, PERCUTANEOUS
Anesthesia: General

## 2020-04-24 SURGERY — INSERTION, GASTROSTOMY TUBE, PERCUTANEOUS
Anesthesia: General | Site: Abdomen

## 2020-04-24 MED ORDER — 0.9 % SODIUM CHLORIDE (POUR BTL) OPTIME
TOPICAL | Status: DC | PRN
  Administered 2020-04-24: 1000 mL

## 2020-04-24 MED ORDER — ROCURONIUM BROMIDE 10 MG/ML (PF) SYRINGE
PREFILLED_SYRINGE | INTRAVENOUS | Status: AC
  Filled 2020-04-24: qty 10

## 2020-04-24 MED ORDER — PHENYLEPHRINE 40 MCG/ML (10ML) SYRINGE FOR IV PUSH (FOR BLOOD PRESSURE SUPPORT)
PREFILLED_SYRINGE | INTRAVENOUS | Status: DC | PRN
  Administered 2020-04-24: 120 ug via INTRAVENOUS
  Administered 2020-04-24: 80 ug via INTRAVENOUS
  Administered 2020-04-24: 120 ug via INTRAVENOUS

## 2020-04-24 MED ORDER — LACTATED RINGERS IV SOLN: INTRAVENOUS | Status: DC | PRN

## 2020-04-24 MED ORDER — BUPIVACAINE HCL (PF) 0.25 % IJ SOLN
INTRAMUSCULAR | Status: AC
  Filled 2020-04-24: qty 30

## 2020-04-24 MED ORDER — MIDAZOLAM HCL 2 MG/2ML IJ SOLN
INTRAMUSCULAR | Status: AC
  Filled 2020-04-24: qty 2

## 2020-04-24 MED ORDER — PHENYLEPHRINE HCL-NACL 10-0.9 MG/250ML-% IV SOLN
INTRAVENOUS | Status: DC | PRN
  Administered 2020-04-24: 60 ug/min via INTRAVENOUS

## 2020-04-24 MED ORDER — FENTANYL CITRATE (PF) 100 MCG/2ML IJ SOLN
INTRAMUSCULAR | Status: DC | PRN
  Administered 2020-04-24: 50 ug via INTRAVENOUS

## 2020-04-24 MED ORDER — LIDOCAINE 2% (20 MG/ML) 5 ML SYRINGE
INTRAMUSCULAR | Status: AC
  Filled 2020-04-24: qty 5

## 2020-04-24 MED ORDER — FENTANYL CITRATE (PF) 250 MCG/5ML IJ SOLN
INTRAMUSCULAR | Status: AC
  Filled 2020-04-24: qty 5

## 2020-04-24 MED ORDER — BUPIVACAINE HCL (PF) 0.25 % IJ SOLN
INTRAMUSCULAR | Status: DC | PRN
  Administered 2020-04-24: 10 mL

## 2020-04-24 MED ORDER — ONDANSETRON HCL 4 MG/2ML IJ SOLN
INTRAMUSCULAR | Status: AC
  Filled 2020-04-24: qty 2

## 2020-04-24 MED ORDER — ROCURONIUM BROMIDE 10 MG/ML (PF) SYRINGE
PREFILLED_SYRINGE | INTRAVENOUS | Status: DC | PRN
  Administered 2020-04-24: 30 mg via INTRAVENOUS
  Administered 2020-04-24 (×2): 20 mg via INTRAVENOUS

## 2020-04-24 MED ORDER — MIDAZOLAM HCL 5 MG/5ML IJ SOLN
INTRAMUSCULAR | Status: DC | PRN
  Administered 2020-04-24: 2 mg via INTRAVENOUS

## 2020-04-24 MED ORDER — ONDANSETRON HCL 4 MG/2ML IJ SOLN
INTRAMUSCULAR | Status: DC | PRN
  Administered 2020-04-24: 4 mg via INTRAVENOUS

## 2020-04-24 SURGICAL SUPPLY — 40 items
ADH SKN CLS APL DERMABOND .7 (GAUZE/BANDAGES/DRESSINGS) ×1
APL PRP STRL LF DISP 70% ISPRP (MISCELLANEOUS) ×1
BAG URINE DRAINAGE (UROLOGICAL SUPPLIES) IMPLANT
BINDER ABDOMINAL 12 ML 46-62 (SOFTGOODS) ×2 IMPLANT
BLADE CLIPPER SURG (BLADE) IMPLANT
CANISTER SUCT 3000ML PPV (MISCELLANEOUS) IMPLANT
CHLORAPREP W/TINT 26 (MISCELLANEOUS) ×2 IMPLANT
COVER SURGICAL LIGHT HANDLE (MISCELLANEOUS) ×2 IMPLANT
COVER WAND RF STERILE (DRAPES) IMPLANT
DECANTER SPIKE VIAL GLASS SM (MISCELLANEOUS) ×2 IMPLANT
DERMABOND ADVANCED (GAUZE/BANDAGES/DRESSINGS) ×1
DERMABOND ADVANCED .7 DNX12 (GAUZE/BANDAGES/DRESSINGS) ×1 IMPLANT
ELECT REM PT RETURN 9FT ADLT (ELECTROSURGICAL) ×2
ELECTRODE REM PT RTRN 9FT ADLT (ELECTROSURGICAL) ×1 IMPLANT
GAUZE SPONGE 4X4 12PLY STRL (GAUZE/BANDAGES/DRESSINGS) ×2 IMPLANT
GLOVE BIOGEL PI IND STRL 7.0 (GLOVE) ×1 IMPLANT
GLOVE BIOGEL PI INDICATOR 7.0 (GLOVE) ×1
GLOVE SURG SS PI 7.0 STRL IVOR (GLOVE) ×2 IMPLANT
GOWN STRL REUS W/ TWL LRG LVL3 (GOWN DISPOSABLE) ×3 IMPLANT
GOWN STRL REUS W/TWL LRG LVL3 (GOWN DISPOSABLE) ×6
KIT BASIN OR (CUSTOM PROCEDURE TRAY) ×2 IMPLANT
KIT INTRODUCER MIC G-18 (SET/KITS/TRAYS/PACK) ×2 IMPLANT
KIT TURNOVER KIT B (KITS) ×2 IMPLANT
NEEDLE 22X1 1/2 (OR ONLY) (NEEDLE) ×2 IMPLANT
NS IRRIG 1000ML POUR BTL (IV SOLUTION) ×2 IMPLANT
PAD ARMBOARD 7.5X6 YLW CONV (MISCELLANEOUS) ×4 IMPLANT
PLUG CATH AND CAP STER (CATHETERS) IMPLANT
SCISSORS LAP 5X35 DISP (ENDOMECHANICALS) ×2 IMPLANT
SET IRRIG TUBING LAPAROSCOPIC (IRRIGATION / IRRIGATOR) IMPLANT
SET TUBE SMOKE EVAC HIGH FLOW (TUBING) ×2 IMPLANT
SLEEVE ENDOPATH XCEL 5M (ENDOMECHANICALS) ×2 IMPLANT
SUT ETHILON 2 0 FS 18 (SUTURE) ×2 IMPLANT
SUT MNCRL AB 4-0 PS2 18 (SUTURE) ×2 IMPLANT
TOWEL GREEN STERILE (TOWEL DISPOSABLE) ×2 IMPLANT
TOWEL GREEN STERILE FF (TOWEL DISPOSABLE) ×2 IMPLANT
TRAY LAPAROSCOPIC MC (CUSTOM PROCEDURE TRAY) ×2 IMPLANT
TROCAR XCEL BLUNT TIP 100MML (ENDOMECHANICALS) IMPLANT
TROCAR XCEL NON-BLD 5MMX100MML (ENDOMECHANICALS) ×2 IMPLANT
TUBE GASTROSTOMY 18F (CATHETERS) ×2 IMPLANT
WATER STERILE IRR 1000ML POUR (IV SOLUTION) ×2 IMPLANT

## 2020-04-24 NOTE — Anesthesia Postprocedure Evaluation (Signed)
Anesthesia Post Note  Patient: Kathleen Cameron  Procedure(s) Performed: LAPAROSCOPIC GASTROSTOMY TUBE PLACEMENT (N/A Abdomen)     Patient location during evaluation: Other Anesthesia Type: General Level of consciousness: sedated Pain management: pain level controlled Vital Signs Assessment: post-procedure vital signs reviewed and stable Respiratory status: patient on ventilator - see flowsheet for VS Cardiovascular status: stable Postop Assessment: no apparent nausea or vomiting Anesthetic complications: no   No complications documented.  Last Vitals: There were no vitals filed for this visit.  Last Pain: There were no vitals filed for this visit.               Lucretia Kern

## 2020-04-24 NOTE — Transfer of Care (Signed)
Immediate Anesthesia Transfer of Care Note  Patient: Kathleen Cameron  Procedure(s) Performed: LAPAROSCOPIC GASTROSTOMY TUBE PLACEMENT (N/A Abdomen)  Patient Location: ICU  Anesthesia Type:General  Level of Consciousness: Patient remains intubated per anesthesia plan  Airway & Oxygen Therapy: Patient remains intubated per anesthesia plan and Patient placed on Ventilator (see vital sign flow sheet for setting)  Post-op Assessment: Report given to RN and Post -op Vital signs reviewed and stable  Post vital signs: Reviewed and stable  Last Vitals:  Vitals Value Taken Time  BP    Temp    Pulse    Resp    SpO2      Last Pain: There were no vitals filed for this visit.       Complications: No complications documented.

## 2020-04-24 NOTE — Op Note (Signed)
Preoperative diagnosis: dysphagia  Postoperative diagnosis: same   Procedure: laparoscopic gastrostomy tube insertion  Surgeon: Feliciana Rossetti, M.D.  Asst: none  Anesthesia: general  Indications for procedure: Kathleen Cameron is a 55 y.o. 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 She presents today for gastrostomy tube creation.  Description of procedure: The patient was brought into the operative suite. Anesthesia was administered with General endotracheal anesthesia. WHO checklist was applied. The patient was then placed in supine position. The area was prepped and draped in the usual sterile fashion.  Next, a transverse incision was in the right subcostal area. A 11mm trocar was used to gain access to the peritoneal cavity by optical entry technique. Pneumoperitoneum was applied with high flow and low pressure and laparoscope was reinserted to confirm placement.  On initial evaluation of the abdomen, There were omental adhesions to the umbilical area. A second 5 mm trocar was placed in the right mid abdomen.  Next, the distal stomach body was identified and appeared to reach the left upper quadrant abdominal wall. 4 t bars were inserted through the abdominal wall and into the distal stomach body in a diamond formation. Next, a incision was made in the middle of this diamond in the skin and a tract was created. A gastrostomy was created with cautery. A 18 french gastrostomy tube was inserted and balloon filled with 6 ml of saline. The t bar sutures were secured down by the bumpers. The gastrostomy balloon was pulled up to the abdominal wall and was noted at 2 cm at the skin level. Pneumoperitoneum was removed. Trocars were removed. All trocars sites were closed with 4-0 monocryl subcuticular sutures. The patient tolerated the procedure well. All counts were correct.  Findings: intact g  tube  Specimen: none  Implant: 18 fr g tube in LUQ   Blood loss: < 10 ml  Local anesthesia: 10 ml marcaine   Complications: none  Feliciana Rossetti, M.D. General, Bariatric, & Minimally Invasive Surgery [facility]

## 2020-04-24 NOTE — Anesthesia Preprocedure Evaluation (Signed)
Anesthesia Evaluation  Patient identified by MRN, date of birth, ID band  Reviewed: Allergy & Precautions, NPO status , Patient's Chart, lab work & pertinent test results  History of Anesthesia Complications Negative for: history of anesthetic complications  Airway Mallampati: Trach       Dental   Pulmonary COPD (s/p tracheostomy; vent dependent),  oxygen dependent,   Tracheostomy in place, on vent  breath sounds clear to auscultation       Cardiovascular hypertension, Normal cardiovascular exam     Neuro/Psych Seizures -,  PSYCHIATRIC DISORDERS Anxiety    GI/Hepatic Neg liver ROS, Nasogastric feeding tube in place   Endo/Other  Hypothyroidism Chronic high-dose prednisone use  Renal/GU negative Renal ROS  negative genitourinary   Musculoskeletal negative musculoskeletal ROS (+)   Abdominal   Peds  Hematology  (+) anemia ,   Anesthesia Other Findings  Echo 03/20/20: EF 60-65%, mild LVH, normal RV function, trivial pericardial effusion anterior to RV, valves unremarkable  Reproductive/Obstetrics                            Anesthesia Physical Anesthesia Plan  ASA: IV  Anesthesia Plan: General   Post-op Pain Management:    Induction: Intravenous and Inhalational  PONV Risk Score and Plan: 3 and Ondansetron, Dexamethasone, Treatment may vary due to age or medical condition and Midazolam  Airway Management Planned: Tracheostomy  Additional Equipment: None  Intra-op Plan:   Post-operative Plan: Post-operative intubation/ventilation  Informed Consent: I have reviewed the patients History and Physical, chart, labs and discussed the procedure including the risks, benefits and alternatives for the proposed anesthesia with the patient or authorized representative who has indicated his/her understanding and acceptance.     Consent reviewed with POA  Plan Discussed with:   Anesthesia Plan  Comments:        Anesthesia Quick Evaluation

## 2020-04-24 NOTE — H&P (Signed)
Requesting MD: Hijazi Chief Complaint/Reason for Consult: gastrostomy placement  HPI:  Patient is a 55 year old female PMH HTN, anxiety/depression, and severe COPD on 2L O2 at home, who was admitted to [facility] 02/25/20 with acute on chronic hypoxic respiratory failure requiring intubation. Husband at bedside who helped with history taking.  Multiple attempts made to wean from the ventilator but unsuccessful. She underwent tracheostomy 03/02/20. She was discharged to [facility] on 03/08/20. Patient has continued to try to wean to trach collar but has not been able to tolerate more than several hours at a time. Apparently she was weaning better last week versus this week. She developed pneumonia last week and just completed a course of IV cefepime on 12/19 for this. Previously tolerating tube feeds via Cortrak but per the patient's husband some time over the weekend Cortrak was removed and she now has a large bore NG tube in her right nare.  There was concern that the nasogastric tube is impeding her ability to wean from the ventilator. IR was consulted for possible PEG placement but were unable to do this because colon was anterior to patient's stomach. General surgery asked to see for possible open gastrostomy tube placement.  No past abdominal surgery. Allergy listed to stiolto respimat.  No cardiac history Nonsmoker PTA worked in Midwife office  ROS: Review of Systems  Constitutional: Positive for malaise/fatigue.  Respiratory: Positive for shortness of breath.   Cardiovascular: Negative.   Gastrointestinal: Negative.   Genitourinary: Negative.   Musculoskeletal: Positive for joint pain.    Family History  Family history unknown: Yes        Past Medical History:  Diagnosis Date  . Acute on chronic respiratory failure with hypoxia (HCC)   . Anxiety disorder   . Asthma   . COPD (chronic obstructive pulmonary disease) (HCC)   . COPD, severe (HCC)   .  Hypertension   . Seizure disorder [facility])          Past Surgical History:  Procedure Laterality Date  . IR FLUORO RM 30-60 MIN  03/23/2020  . WISDOM TOOTH EXTRACTION      Social History:  reports that she has never smoked. She has never used smokeless tobacco. She reports current alcohol use of about 2.0 standard drinks of alcohol per week. She reports that she does not use drugs.  Allergies:       Allergies  Allergen Reactions  . Stiolto Respimat [Tiotropium Bromide-Olodaterol] Other (See Comments)    Severe headaches and rapid heartrate    (Not in a hospital admission)   Blood pressure 97/70, pulse 87, temperature 98.4 F (36.9 C), temperature source Oral, resp. rate 16, height 5\' 4"  (1.626 m), weight 57 kg, SpO2 99 %. Physical Exam:  General: pleasant, chronically ill appearing female who is laying in bed in NAD HEENT: head is normocephalic, atraumatic.  Sclera are noninjected.  PERRL.  Exophthalmos bilaterally. Ears and nose without any masses or lesions.  Mouth is pink and moist. Large bore NG tube in right nare. Trach in place. Heart: regular, rate, and rhythm.  Normal s1,s2. No obvious murmurs, gallops, or rubs noted.  Palpable radial and pedal pulses bilaterally Lungs: decreased breath sounds bilateral bases. No wheezing or rhonchi. Mechanically ventilated Abd: soft, mild distension, NT, +BS, no masses, hernias, or organomegaly MS: all 4 extremities are symmetrical with no cyanosis, clubbing, or edema. Skin: warm and dry with no masses, lesions, or rashes Neuro: Cranial nerves 2-12 grossly intact,  sensation is normal throughout Psych: appears A&O, nods appropriately to questions, appropriate affect.   Lab Results Last 48 Hours        Results for orders placed or performed during the hospital encounter of 03/08/20 (from the past 48 hour(s))  CBC     Status: Abnormal   Collection Time: 04/10/20  3:27 AM  Result Value Ref Range   WBC 13.1 (H) 4.0 -  10.5 K/uL   RBC 2.61 (L) 3.87 - 5.11 MIL/uL   Hemoglobin 8.3 (L) 12.0 - 15.0 g/dL   HCT 27.8 (L) 36.0 - 46.0 %   MCV 106.5 (H) 80.0 - 100.0 fL   MCH 31.8 26.0 - 34.0 pg   MCHC 29.9 (L) 30.0 - 36.0 g/dL   RDW 19.1 (H) 11.5 - 15.5 %   Platelets 121 (L) 150 - 400 K/uL   nRBC 0.0 0.0 - 0.2 %    Comment: Performed at [facility], [address] [place], [place] [address]  Basic metabolic panel     Status: Abnormal   Collection Time: 04/10/20  3:27 AM  Result Value Ref Range   Sodium 144 135 - 145 mmol/L   Potassium 4.9 3.5 - 5.1 mmol/L   Chloride 97 (L) 98 - 111 mmol/L   CO2 34 (H) 22 - 32 mmol/L   Glucose, Bld 174 (H) 70 - 99 mg/dL    Comment: Glucose reference range applies only to samples taken after fasting for at least 8 hours.   BUN 56 (H) 6 - 20 mg/dL   Creatinine, Ser 0.49 0.44 - 1.00 mg/dL   Calcium 9.0 8.9 - 10.3 mg/dL   GFR, Estimated >60 >60 mL/min    Comment: (NOTE) Calculated using the CKD-EPI Creatinine Equation (2021)    Anion gap 13 5 - 15    Comment: Performed at [facility] [address] [place], [place] [address]  Blood gas, arterial     Status: Abnormal   Collection Time: 04/10/20  7:43 AM  Result Value Ref Range   FIO2 64.00    pH, Arterial 7.373 7.350 - 7.450   pCO2 arterial 71.0 (HH) 32.0 - 48.0 mmHg    Comment: CRITICAL RESULT CALLED TO, READ BACK BY AND VERIFIED WITH: B.FIELDS,RN 04/10/2020 0757 DAVISB    pO2, Arterial 150 (H) 83.0 - 108.0 mmHg   Bicarbonate 40.4 (H) 20.0 - 28.0 mmol/L   Acid-Base Excess 14.5 (H) 0.0 - 2.0 mmol/L   O2 Saturation 99.6 %   Patient temperature 36.8    Collection site RIGHT BRACHIAL    Drawn by COLLECTED BY RT     Comment: N.PAYNE   Sample type ARTERIAL DRAW     Comment: Performed at [facility], [place] [address] [place], [place] [address]      Imaging Results (Last 48 hours)  DG Abd 1 View  Result Date: 04/09/2020 CLINICAL DATA:   Enteric catheter placement EXAM: ABDOMEN - 1 VIEW COMPARISON:  04/09/2020 FINDINGS: Frontal view of the lower chest and upper abdomen demonstrates enteric catheter passes below diaphragm, tip and side port projecting over gastric antrum. No significant change since prior study. Distended gas-filled loops of large and small bowel are unchanged. Patchy areas of consolidation at the lung bases are stable. IMPRESSION: 1. Stable enteric catheter overlying gastric antrum. Electronically Signed   By: Randa Ngo M.D.   On: 04/09/2020 15:27   DG Abd Portable 1V  Result Date: 04/09/2020 CLINICAL DATA:  Enteric tube placement.  EXAM: PORTABLE ABDOMEN - 1 VIEW COMPARISON:  Abdominal x-ray dated March 31, 2020. FINDINGS: Enteric tube in the distal stomach. Persistent mildly distended small bowel loops in the left upper quadrant. Continued patchy opacities throughout the left lung. IMPRESSION: 1. Enteric tube in the distal stomach. Electronically Signed   By: Obie Dredge M.D.   On: 04/09/2020 13:32       Assessment/Plan Advanced COPD/Acute on chronic respiratory failure s/p tracheostomy 03/02/20 Chronic steroid use - currently on 20mg  prednisone daily HTN Seizure disorder Anxiety/depression with panic attacks Hypothyroidism Chronic constipation   Consult for gastrostomy tube placement  - Patient currently admitted to [facility] with advanced COPD/Acute on chronic respiratory failure s/p tracheostomy 03/02/20. She is having difficulty weaning from the ventilator. There was concern that the nasogastric tube is impeding her ability to wean from the ventilator. She currently has a large bore NG tube in her right nare, unsure when the previously placed Cortak was removed but the patient's husband thinks this was switched over the weekend. She also recently had a pneumonia and completed antibiotics for this less than 48 hours ago. The patient's husband is also concerned with the antianxiety medications  that Ms. Reynolds is getting. She was on xanax PRN prior to admission but she is currently getting this scheduled TID and he feels that it makes her sleepy. Her failure to wean from the ventilator could be multifactorial and not just related to the NG tube.  An open gastrostomy tube is not an urgent surgical intervention so I think that we should give her more time. The patient and her husband would also like to avoid a major abdominal operation if possible. Recommend switching large bore NG tube to Cortrak, get her further out from her pneumonia, and work on balancing anxiety without oversedating the patient. Please reach back out to 13/12/21 next week if still having issues.  [place], PA-C [facility] 04/10/2020, 1:44 PM Please see Amion for pager number during day hours 7:00am-4:30pm

## 2020-04-24 NOTE — Progress Notes (Signed)
Pulmonary Critical Care Medicine [facility] [place]   PULMONARY CRITICAL CARE SERVICE  PROGRESS NOTE  Date of Service: 04/24/2020  Kathleen Cameron  VQM:[id number]  DOB: [birth year]   DOA: 04/10/2020  Referring Physician: Carron Curie, MD  HPI: Kathleen Cameron is a 55 y.o. female seen for follow up of Acute on Chronic Respiratory Failure.  At this time patient is on pressure control mode has been on 35% FiO2 good saturations are noted at this time patient was placed on Versed drip  Medications: Reviewed on Rounds  Physical Exam:  Vitals: Temperature is 96.6 pulse 97 respiratory 20 blood pressure is 130/83 saturations 93%  Ventilator Settings on pressure control FiO2 35% IP 53 PEEP 5  . General: Comfortable at this time . Eyes: Grossly normal lids, irises & conjunctiva . ENT: grossly tongue is normal . Neck: no obvious mass . Cardiovascular: S1 S2 normal no gallop . Respiratory: Scattered rhonchi very coarse breath sounds . Abdomen: soft . Skin: no rash seen on limited exam . Musculoskeletal: not rigid . Psychiatric:unable to assess . Neurologic: no seizure no involuntary movements         Lab Data:   Basic Metabolic Panel: Recent Labs  Lab 04/23/20 1530  NA 141  K 4.4  CL 89*  CO2 42*  GLUCOSE 170*  BUN 44*  CREATININE 0.45  CALCIUM 9.0    ABG: Recent Labs  Lab 04/23/20 1131  PHART 7.313*  PCO2ART 90.1*  PO2ART 114*  HCO3 44.9*  O2SAT 98.6    Liver Function Tests: No results for input(s): AST, ALT, ALKPHOS, BILITOT, PROT, ALBUMIN in the last 168 hours. No results for input(s): LIPASE, AMYLASE in the last 168 hours. No results for input(s): AMMONIA in the last 168 hours.  CBC: Recent Labs  Lab 04/18/20 0510 04/23/20 1653  WBC 6.3 6.8  HGB 7.4* 7.8*  HCT 23.8* 24.5*  MCV 103.9* 103.8*  PLT 108* 138*    Cardiac Enzymes: No results for input(s): CKTOTAL, CKMB, CKMBINDEX, TROPONINI in the last 168 hours.  BNP (last 3  results) Recent Labs    09/06/19 1414 01/28/20 1503 02/25/20 1115  BNP 32.2 106.8* 282.7*    ProBNP (last 3 results) No results for input(s): PROBNP in the last 8760 hours.  Radiological Exams: DG Abd 1 View  Result Date: 04/23/2020 CLINICAL DATA:  Abdominal distension. EXAM: ABDOMEN - 1 VIEW COMPARISON:  04/09/2020. FINDINGS: Is no bowel dilation to suggest obstruction. Nasal/orogastric tube tip projects in the distal stomach. Moderate increased stool distends the rectum. There is mild generalized increased colonic stool burden. No evidence of renal or ureteral stones. Soft tissues are unremarkable. No acute skeletal abnormality. IMPRESSION: 1. No acute findings.  No evidence of bowel obstruction. 2. Mild increase in the colonic stool burden with moderate stool distending the rectum. Electronically Signed   By: Amie Portland M.D.   On: 04/23/2020 12:35   DG Chest Port 1 View  Result Date: 04/23/2020 CLINICAL DATA:  Acute respiratory distress.  Abdominal distention. EXAM: PORTABLE CHEST 1 VIEW COMPARISON:  04/17/2020 and older exams. FINDINGS: Interstitial opacities throughout most of the left lung with hazy airspace opacities in the left perihilar and lower lung are similar to the prior exam allowing for differences in patient positioning and technique. Lungs are hyperexpanded. Right lung is essentially clear. No convincing pleural effusion and no pneumothorax. Tracheostomy tube and nasal/orogastric tube are stable and well positioned. IMPRESSION: 1. No significant change from the most recent prior study. 2. Left lung  interstitial and hazy airspace opacities again noted consistent with pneumonia. Electronically Signed   By: Amie Portland M.D.   On: 04/23/2020 12:34    Assessment/Plan Active Problems:   Acute on chronic respiratory failure with hypoxia (HCC)   Seizure disorder (HCC)   COPD, severe (HCC)   Anxiety disorder   1. Acute on chronic respiratory failure with hypoxia patient had  an ABG done yesterday which showed increased PCO2 she is now on full support on the ventilator with an IP of 23 and pressure control mode.  Also was started on Versed drip would recommend doing a follow-up ABG 2. Seizure disorder no active seizures noted at this time. 3. Severe COPD at baseline prognosis is guarded. 4. Anxiety disorder right now patient's sedated with Versed drip   I have personally seen and evaluated the patient, evaluated laboratory and imaging results, formulated the assessment and plan and placed orders. The Patient requires high complexity decision making with multiple systems involvement.  Rounds were done with the Respiratory Therapy Director and Staff therapists and discussed with nursing staff also.  Yevonne Pax, MD [facility] Pulmonary Critical Care Medicine Sleep Medicine

## 2020-04-25 ENCOUNTER — Encounter (HOSPITAL_COMMUNITY): Payer: Self-pay | Admitting: General Surgery

## 2020-04-25 LAB — CBC
HCT: 19.3 % — ABNORMAL LOW (ref 36.0–46.0)
Hemoglobin: 6 g/dL — CL (ref 12.0–15.0)
MCH: 32.3 pg (ref 26.0–34.0)
MCHC: 31.1 g/dL (ref 30.0–36.0)
MCV: 103.8 fL — ABNORMAL HIGH (ref 80.0–100.0)
Platelets: 149 10*3/uL — ABNORMAL LOW (ref 150–400)
RBC: 1.86 MIL/uL — ABNORMAL LOW (ref 3.87–5.11)
RDW: 18.5 % — ABNORMAL HIGH (ref 11.5–15.5)
WBC: 8.1 10*3/uL (ref 4.0–10.5)
nRBC: 0 % (ref 0.0–0.2)

## 2020-04-25 LAB — HEMOGLOBIN AND HEMATOCRIT, BLOOD
HCT: 28.2 % — ABNORMAL LOW (ref 36.0–46.0)
Hemoglobin: 8.7 g/dL — ABNORMAL LOW (ref 12.0–15.0)

## 2020-04-25 LAB — BASIC METABOLIC PANEL
Anion gap: 10 (ref 5–15)
BUN: 45 mg/dL — ABNORMAL HIGH (ref 6–20)
CO2: 40 mmol/L — ABNORMAL HIGH (ref 22–32)
Calcium: 8.8 mg/dL — ABNORMAL LOW (ref 8.9–10.3)
Chloride: 92 mmol/L — ABNORMAL LOW (ref 98–111)
Creatinine, Ser: 0.53 mg/dL (ref 0.44–1.00)
GFR, Estimated: >60 mL/min (ref >60)
Glucose, Bld: 121 mg/dL — ABNORMAL HIGH (ref 70–99)
Potassium: 4.1 mmol/L (ref 3.5–5.1)
Sodium: 142 mmol/L (ref 135–145)

## 2020-04-25 LAB — PREPARE RBC (CROSSMATCH)

## 2020-04-25 NOTE — Progress Notes (Signed)
Pulmonary Critical Care Medicine [facility] [place]   PULMONARY CRITICAL CARE SERVICE  PROGRESS NOTE  Date of Service: 04/25/2020  Kathleen Cameron  ZHY:[id number]  DOB: 08-02-1965   DOA: 04/10/2020  Referring Physician: Carron Curie, MD  HPI: Kathleen Cameron is a 56 y.o. female seen for follow up of Acute on Chronic Respiratory Failure.  Patient currently is on the ventilator and full support had PEG placed right now is resting  Medications: Reviewed on Rounds  Physical Exam:  Vitals: Temperature is 97.1 pulse 62 respiratory rate 26 blood pressure is 128/76 saturations 100%  Ventilator Settings on pressure control FiO2 is 35% PEEP 5 IP 23  . General: Comfortable at this time . Eyes: Grossly normal lids, irises & conjunctiva . ENT: grossly tongue is normal . Neck: no obvious mass . Cardiovascular: S1 S2 normal no gallop . Respiratory: No rales noted at this time . Abdomen: soft . Skin: no rash seen on limited exam . Musculoskeletal: not rigid . Psychiatric:unable to assess . Neurologic: no seizure no involuntary movements         Lab Data:   Basic Metabolic Panel: Recent Labs  Lab 04/23/20 1530 04/25/20 0643  NA 141 142  K 4.4 4.1  CL 89* 92*  CO2 42* 40*  GLUCOSE 170* 121*  BUN 44* 45*  CREATININE 0.45 0.53  CALCIUM 9.0 8.8*    ABG: Recent Labs  Lab 04/23/20 1131 04/24/20 0932  PHART 7.313* 7.460*  PCO2ART 90.1* 63.9*  PO2ART 114* 70.2*  HCO3 44.9* 44.9*  O2SAT 98.6 95.2    Liver Function Tests: No results for input(s): AST, ALT, ALKPHOS, BILITOT, PROT, ALBUMIN in the last 168 hours. No results for input(s): LIPASE, AMYLASE in the last 168 hours. No results for input(s): AMMONIA in the last 168 hours.  CBC: Recent Labs  Lab 04/23/20 1653 04/25/20 0805 04/25/20 1458  WBC 6.8 8.1  --   HGB 7.8* 6.0* 8.7*  HCT 24.5* 19.3* 28.2*  MCV 103.8* 103.8*  --   PLT 138* 149*  --     Cardiac Enzymes: No results for input(s):  CKTOTAL, CKMB, CKMBINDEX, TROPONINI in the last 168 hours.  BNP (last 3 results) Recent Labs    09/06/19 1414 01/28/20 1503 02/25/20 1115  BNP 32.2 106.8* 282.7*    ProBNP (last 3 results) No results for input(s): PROBNP in the last 8760 hours.  Radiological Exams: No results found.  Assessment/Plan Active Problems:   Acute on chronic respiratory failure with hypoxia (HCC)   Seizure disorder (HCC)   COPD, severe (HCC)   Anxiety disorder   1. Acute on chronic respiratory failure with hypoxia we will continue with pressure control mode titrate oxygen continue pulmonary toilet. 2. Seizure disorder there is no active seizure noted at this time 3. Severe COPD at baseline 4. Anxiety disorder continue to follow along   I have personally seen and evaluated the patient, evaluated laboratory and imaging results, formulated the assessment and plan and placed orders. The Patient requires high complexity decision making with multiple systems involvement.  Rounds were done with the Respiratory Therapy Director and Staff therapists and discussed with nursing staff also.  Yevonne Pax, MD [facility] Pulmonary Critical Care Medicine Sleep Medicine

## 2020-04-26 ENCOUNTER — Other Ambulatory Visit (HOSPITAL_COMMUNITY): Payer: Self-pay

## 2020-04-26 LAB — CBC
HCT: 26 % — ABNORMAL LOW (ref 36.0–46.0)
Hemoglobin: 7.8 g/dL — ABNORMAL LOW (ref 12.0–15.0)
MCH: 30.6 pg (ref 26.0–34.0)
MCHC: 30 g/dL (ref 30.0–36.0)
MCV: 102 fL — ABNORMAL HIGH (ref 80.0–100.0)
Platelets: 137 10*3/uL — ABNORMAL LOW (ref 150–400)
RBC: 2.55 MIL/uL — ABNORMAL LOW (ref 3.87–5.11)
RDW: 18.7 % — ABNORMAL HIGH (ref 11.5–15.5)
WBC: 8.5 10*3/uL (ref 4.0–10.5)
nRBC: 0.7 % — ABNORMAL HIGH (ref 0.0–0.2)

## 2020-04-26 LAB — TYPE AND SCREEN
ABO/RH(D): O POS
Antibody Screen: NEGATIVE
Unit division: 0

## 2020-04-26 LAB — BPAM RBC
Blood Product Expiration Date: 202202082359
ISSUE DATE / TIME: 202201051026
Unit Type and Rh: 5100

## 2020-04-26 NOTE — Progress Notes (Signed)
Pulmonary Critical Care Medicine [facility] [place]   PULMONARY CRITICAL CARE SERVICE  PROGRESS NOTE  Date of Service: 04/26/2020  Kathleen Cameron  DPO:[id number]  DOB: 1966-04-21   DOA: 04/10/2020  Referring Physician: Carron Curie, MD  HPI: Kathleen Cameron is a 55 y.o. female seen for follow up of Acute on Chronic Respiratory Failure.  Patient currently is on pressure control mode has been on 40% FiO2 through the night.  23  Medications: Reviewed on Rounds  Physical Exam:  Vitals: Temperature is 98.8 pulse 68 ST rate is 20 blood pressure is 112/71 saturations 100%  Ventilator Settings on pressure control FiO2 40% PEEP 5 IP 23  . General: Comfortable at this time . Eyes: Grossly normal lids, irises & conjunctiva . ENT: grossly tongue is normal . Neck: no obvious mass . Cardiovascular: S1 S2 normal no gallop . Respiratory: No rhonchi very coarse breath . Abdomen: soft . Skin: no rash seen on limited exam . Musculoskeletal: not rigid . Psychiatric:unable to assess . Neurologic: no seizure no involuntary movements         Lab Data:   Basic Metabolic Panel: Recent Labs  Lab 04/23/20 1530 04/25/20 0643  NA 141 142  K 4.4 4.1  CL 89* 92*  CO2 42* 40*  GLUCOSE 170* 121*  BUN 44* 45*  CREATININE 0.45 0.53  CALCIUM 9.0 8.8*    ABG: Recent Labs  Lab 04/23/20 1131 04/24/20 0932  PHART 7.313* 7.460*  PCO2ART 90.1* 63.9*  PO2ART 114* 70.2*  HCO3 44.9* 44.9*  O2SAT 98.6 95.2    Liver Function Tests: No results for input(s): AST, ALT, ALKPHOS, BILITOT, PROT, ALBUMIN in the last 168 hours. No results for input(s): LIPASE, AMYLASE in the last 168 hours. No results for input(s): AMMONIA in the last 168 hours.  CBC: Recent Labs  Lab 04/23/20 1653 04/25/20 0805 04/25/20 1458 04/26/20 0301  WBC 6.8 8.1  --  8.5  HGB 7.8* 6.0* 8.7* 7.8*  HCT 24.5* 19.3* 28.2* 26.0*  MCV 103.8* 103.8*  --  102.0*  PLT 138* 149*  --  137*    Cardiac  Enzymes: No results for input(s): CKTOTAL, CKMB, CKMBINDEX, TROPONINI in the last 168 hours.  BNP (last 3 results) Recent Labs    09/06/19 1414 01/28/20 1503 02/25/20 1115  BNP 32.2 106.8* 282.7*    ProBNP (last 3 results) No results for input(s): PROBNP in the last 8760 hours.  Radiological Exams: No results found.  Assessment/Plan Active Problems:   Acute on chronic respiratory failure with hypoxia (HCC)   Seizure disorder (HCC)   COPD, severe (HCC)   Anxiety disorder   1. Acute on chronic respiratory failure with oxygen continue with pressure control 2. Titrate oxygen continue pulm toilet. 3. Seizure disorder there is no active seizures noted 4. Severe COPD at baseline 5. Anxiety disorder patient is at baseline   I have personally seen and evaluated the patient, evaluated laboratory and imaging results, formulated the assessment and plan and placed orders. The Patient requires high complexity decision making with multiple systems involvement.  Rounds were done with the Respiratory Therapy Director and Staff therapists and discussed with nursing staff also.  Yevonne Pax, MD [facility] Pulmonary Critical Care Medicine Sleep Medicine

## 2020-04-27 NOTE — Progress Notes (Signed)
Pulmonary Critical Care Medicine [facility] [place]   PULMONARY CRITICAL CARE SERVICE  PROGRESS NOTE  Date of Service: 04/27/2020  Kathleen Cameron  LGX:[id number]  DOB: Apr 27, 1965   DOA: 04/10/2020  Referring Physician: Carron Curie, MD  HPI: Kathleen Cameron is a 55 y.o. female seen for follow up of Acute on Chronic Respiratory Failure.  At this time patient is on full support on the ventilator and pressure control mode not been tolerating weaning again  Medications: Reviewed on Rounds  Physical Exam:  Vitals: Temperature is 97.6 pulse 90 respiratory rate is 35 blood pressure is 132/76 saturations 96%  Ventilator Settings on pressure assist control FiO2 40% IP 23 PEEP 5  . General: Comfortable at this time . Eyes: Grossly normal lids, irises & conjunctiva . ENT: grossly tongue is normal . Neck: no obvious mass . Cardiovascular: S1 S2 normal no gallop . Respiratory: Scattered rhonchi expansion is . Abdomen: soft . Skin: no rash seen on limited exam . Musculoskeletal: not rigid . Psychiatric:unable to assess . Neurologic: no seizure no involuntary movements         Lab Data:   Basic Metabolic Panel: Recent Labs  Lab 04/23/20 1530 04/25/20 0643  NA 141 142  K 4.4 4.1  CL 89* 92*  CO2 42* 40*  GLUCOSE 170* 121*  BUN 44* 45*  CREATININE 0.45 0.53  CALCIUM 9.0 8.8*    ABG: Recent Labs  Lab 04/23/20 1131 04/24/20 0932  PHART 7.313* 7.460*  PCO2ART 90.1* 63.9*  PO2ART 114* 70.2*  HCO3 44.9* 44.9*  O2SAT 98.6 95.2    Liver Function Tests: No results for input(s): AST, ALT, ALKPHOS, BILITOT, PROT, ALBUMIN in the last 168 hours. No results for input(s): LIPASE, AMYLASE in the last 168 hours. No results for input(s): AMMONIA in the last 168 hours.  CBC: Recent Labs  Lab 04/23/20 1653 04/25/20 0805 04/25/20 1458 04/26/20 0301  WBC 6.8 8.1  --  8.5  HGB 7.8* 6.0* 8.7* 7.8*  HCT 24.5* 19.3* 28.2* 26.0*  MCV 103.8* 103.8*  --  102.0*   PLT 138* 149*  --  137*    Cardiac Enzymes: No results for input(s): CKTOTAL, CKMB, CKMBINDEX, TROPONINI in the last 168 hours.  BNP (last 3 results) Recent Labs    09/06/19 1414 01/28/20 1503 02/25/20 1115  BNP 32.2 106.8* 282.7*    ProBNP (last 3 results) No results for input(s): PROBNP in the last 8760 hours.  Radiological Exams: DG Abd 1 View  Result Date: 04/26/2020 CLINICAL DATA:  Abdominal distension. EXAM: ABDOMEN - 1 VIEW COMPARISON:  April 23, 2020. FINDINGS: The bowel gas pattern is normal. Moderate amount of stool seen throughout the colon and rectum. No radio-opaque calculi or other significant radiographic abnormality are seen. IMPRESSION: Moderate stool burden. No evidence of bowel obstruction. Electronically Signed   By: Lupita Raider M.D.   On: 04/26/2020 12:17    Assessment/Plan Active Problems:   Acute on chronic respiratory failure with hypoxia (HCC)   Seizure disorder (HCC)   COPD, severe (HCC)   Anxiety disorder   1. Acute on chronic respiratory failure with hypoxia we will continue with full support on the ventilator and pressure control mode patient's been on a PEEP of 5 2. Seizure disorder there is no active seizure noted at this time. 3. Severe COPD we will continue to monitor 4. Anxiety disorder patient is at baseline right now medical management   I have personally seen and evaluated the patient, evaluated laboratory and  imaging results, formulated the assessment and plan and placed orders. The Patient requires high complexity decision making with multiple systems involvement.  Rounds were done with the Respiratory Therapy Director and Staff therapists and discussed with nursing staff also.  Allyne Gee, MD [facility] Pulmonary Critical Care Medicine Sleep Medicine

## 2020-04-28 LAB — CBC
HCT: 31.3 % — ABNORMAL LOW (ref 36.0–46.0)
Hemoglobin: 9.5 g/dL — ABNORMAL LOW (ref 12.0–15.0)
MCH: 31.8 pg (ref 26.0–34.0)
MCHC: 30.4 g/dL (ref 30.0–36.0)
MCV: 104.7 fL — ABNORMAL HIGH (ref 80.0–100.0)
Platelets: 176 10*3/uL (ref 150–400)
RBC: 2.99 MIL/uL — ABNORMAL LOW (ref 3.87–5.11)
RDW: 17.2 % — ABNORMAL HIGH (ref 11.5–15.5)
WBC: 8.7 10*3/uL (ref 4.0–10.5)
nRBC: 0 % (ref 0.0–0.2)

## 2020-04-28 LAB — BASIC METABOLIC PANEL
Anion gap: 11 (ref 5–15)
BUN: 39 mg/dL — ABNORMAL HIGH (ref 6–20)
CO2: 38 mmol/L — ABNORMAL HIGH (ref 22–32)
Calcium: 8.9 mg/dL (ref 8.9–10.3)
Chloride: 91 mmol/L — ABNORMAL LOW (ref 98–111)
Creatinine, Ser: 0.36 mg/dL — ABNORMAL LOW (ref 0.44–1.00)
GFR, Estimated: >60 mL/min (ref >60)
Glucose, Bld: 173 mg/dL — ABNORMAL HIGH (ref 70–99)
Potassium: 4.9 mmol/L (ref 3.5–5.1)
Sodium: 140 mmol/L (ref 135–145)

## 2020-04-28 LAB — MAGNESIUM: Magnesium: 2 mg/dL (ref 1.7–2.4)

## 2020-04-28 NOTE — Progress Notes (Signed)
Pulmonary Critical Care Medicine [facility] [place]   PULMONARY CRITICAL CARE SERVICE  PROGRESS NOTE  Date of Service: 04/28/2020  Kathleen Cameron  ZOX:[id number]  DOB: 07-09-1965   DOA: 04/10/2020  Referring Physician: Carron Curie, MD  HPI: Kathleen Cameron is a 55 y.o. female seen for follow up of Acute on Chronic Respiratory Failure.  Patient currently is on pressure support has been on 40% FiO2  Medications: Reviewed on Rounds  Physical Exam:  Vitals: Temperature is 96.3 pulse 83 respiratory 24 blood pressure is 146/88 saturations 94%  Ventilator Settings on pressure support FiO2 is 40% pressure 15/5  . General: Comfortable at this time . Eyes: Grossly normal lids, irises & conjunctiva . ENT: grossly tongue is normal . Neck: no obvious mass . Cardiovascular: S1 S2 normal no gallop . Respiratory: No rhonchi no rales noted at this . Abdomen: soft . Skin: no rash seen on limited exam . Musculoskeletal: not rigid . Psychiatric:unable to assess . Neurologic: no seizure no involuntary movements         Lab Data:   Basic Metabolic Panel: Recent Labs  Lab 04/23/20 1530 04/25/20 0643 04/28/20 0559  NA 141 142 140  K 4.4 4.1 4.9  CL 89* 92* 91*  CO2 42* 40* 38*  GLUCOSE 170* 121* 173*  BUN 44* 45* 39*  CREATININE 0.45 0.53 0.36*  CALCIUM 9.0 8.8* 8.9  MG  --   --  2.0    ABG: Recent Labs  Lab 04/23/20 1131 04/24/20 0932  PHART 7.313* 7.460*  PCO2ART 90.1* 63.9*  PO2ART 114* 70.2*  HCO3 44.9* 44.9*  O2SAT 98.6 95.2    Liver Function Tests: No results for input(s): AST, ALT, ALKPHOS, BILITOT, PROT, ALBUMIN in the last 168 hours. No results for input(s): LIPASE, AMYLASE in the last 168 hours. No results for input(s): AMMONIA in the last 168 hours.  CBC: Recent Labs  Lab 04/23/20 1653 04/25/20 0805 04/25/20 1458 04/26/20 0301 04/28/20 0559  WBC 6.8 8.1  --  8.5 8.7  HGB 7.8* 6.0* 8.7* 7.8* 9.5*  HCT 24.5* 19.3* 28.2* 26.0* 31.3*   MCV 103.8* 103.8*  --  102.0* 104.7*  PLT 138* 149*  --  137* 176    Cardiac Enzymes: No results for input(s): CKTOTAL, CKMB, CKMBINDEX, TROPONINI in the last 168 hours.  BNP (last 3 results) Recent Labs    09/06/19 1414 01/28/20 1503 02/25/20 1115  BNP 32.2 106.8* 282.7*    ProBNP (last 3 results) No results for input(s): PROBNP in the last 8760 hours.  Radiological Exams: DG Abd 1 View  Result Date: 04/26/2020 CLINICAL DATA:  Abdominal distension. EXAM: ABDOMEN - 1 VIEW COMPARISON:  April 23, 2020. FINDINGS: The bowel gas pattern is normal. Moderate amount of stool seen throughout the colon and rectum. No radio-opaque calculi or other significant radiographic abnormality are seen. IMPRESSION: Moderate stool burden. No evidence of bowel obstruction. Electronically Signed   By: Lupita Raider M.D.   On: 04/26/2020 12:17    Assessment/Plan Active Problems:   Acute on chronic respiratory failure with hypoxia (HCC)   Seizure disorder (HCC)   COPD, severe (HCC)   Anxiety disorder   1. Acute on chronic respiratory failure hypoxia trying once again on pressure support see how she does however she is requiring a pressure of 15/5. 2. Seizure disorder there is no active seizures noted at this time. 3. Severe COPD medical management 4. Anxiety disorder under better control   I have personally seen and evaluated  the patient, evaluated laboratory and imaging results, formulated the assessment and plan and placed orders. The Patient requires high complexity decision making with multiple systems involvement.  Rounds were done with the Respiratory Therapy Director and Staff therapists and discussed with nursing staff also.  Allyne Gee, MD [facility] Pulmonary Critical Care Medicine Sleep Medicine

## 2020-04-29 DIAGNOSIS — Z9911 Dependence on respirator [ventilator] status: Secondary | ICD-10-CM

## 2020-04-29 NOTE — Progress Notes (Signed)
Pulmonary Critical Care Medicine [facility] [place]   PULMONARY CRITICAL CARE SERVICE  PROGRESS NOTE  Date of Service: 04/29/2020  Kathleen Cameron  RCB:[id number]  DOB: Dec 21, 1965   DOA: 04/10/2020  Referring Physician: Carron Curie, MD  HPI: Scotland Dost is a 55 y.o. female seen for follow up of Acute on Chronic Respiratory Failure.  She continues to do poorly she is on full support on pressure control.  I had a conversation with primary care team and I reviewed her pulmonary care from her pulmonologist.  She has very severe obstructive lung disease unclear as to the etiology as she was a non-smoker however her last FEV1 which was done was 29% less than 600 cc on the FEV1 consistent with very severe obstruction  Medications: Reviewed on Rounds  Physical Exam:  Vitals: Temperature 97.0 pulse 86 respiratory rate 20 blood pressure 144/75 saturations 95%  Ventilator Settings on pressure assist control IP 23 PEEP 5 tidal volume 400  . General: Comfortable at this time . Eyes: Grossly normal lids, irises & conjunctiva . ENT: grossly tongue is normal . Neck: no obvious mass . Cardiovascular: S1 S2 normal no gallop . Respiratory: Scattered rhonchi diminished overall . Abdomen: soft . Skin: no rash seen on limited exam . Musculoskeletal: not rigid . Psychiatric:unable to assess . Neurologic: no seizure no involuntary movements         Lab Data:   Basic Metabolic Panel: Recent Labs  Lab 04/23/20 1530 04/25/20 0643 04/28/20 0559  NA 141 142 140  K 4.4 4.1 4.9  CL 89* 92* 91*  CO2 42* 40* 38*  GLUCOSE 170* 121* 173*  BUN 44* 45* 39*  CREATININE 0.45 0.53 0.36*  CALCIUM 9.0 8.8* 8.9  MG  --   --  2.0    ABG: Recent Labs  Lab 04/23/20 1131 04/24/20 0932  PHART 7.313* 7.460*  PCO2ART 90.1* 63.9*  PO2ART 114* 70.2*  HCO3 44.9* 44.9*  O2SAT 98.6 95.2    Liver Function Tests: No results for input(s): AST, ALT, ALKPHOS, BILITOT, PROT, ALBUMIN in  the last 168 hours. No results for input(s): LIPASE, AMYLASE in the last 168 hours. No results for input(s): AMMONIA in the last 168 hours.  CBC: Recent Labs  Lab 04/23/20 1653 04/25/20 0805 04/25/20 1458 04/26/20 0301 04/28/20 0559  WBC 6.8 8.1  --  8.5 8.7  HGB 7.8* 6.0* 8.7* 7.8* 9.5*  HCT 24.5* 19.3* 28.2* 26.0* 31.3*  MCV 103.8* 103.8*  --  102.0* 104.7*  PLT 138* 149*  --  137* 176    Cardiac Enzymes: No results for input(s): CKTOTAL, CKMB, CKMBINDEX, TROPONINI in the last 168 hours.  BNP (last 3 results) Recent Labs    09/06/19 1414 01/28/20 1503 02/25/20 1115  BNP 32.2 106.8* 282.7*    ProBNP (last 3 results) No results for input(s): PROBNP in the last 8760 hours.  Radiological Exams: No results found.  Assessment/Plan Active Problems:   Acute on chronic respiratory failure with hypoxia (HCC)   Seizure disorder (HCC)   COPD, severe (HCC)   Anxiety disorder   1. Acute on chronic respiratory failure hypoxia we will continue with full support in the setting of very severe obstructive lung disease chances of weaning are poor 2. Seizure disorder no active seizure noted at this time. 3. Severe COPD patient also had noted elevated pulmonary arterial pressures based on previous echocardiogram.  Spoke with primary care team I would consider salvage therapy with sildenafil for the pulmonary hypertension and in  addition would consider theophylline as well with the hope of improving respiratory mechanics 4. Anxiety disorder no change we will need to continue to monitor closely   I have personally seen and evaluated the patient, evaluated laboratory and imaging results, formulated the assessment and plan and placed orders. The Patient requires high complexity decision making with multiple systems involvement.  Rounds were done with the Respiratory Therapy Director and Staff therapists and discussed with nursing staff also.  Yevonne Pax, MD [facility] Pulmonary Critical  Care Medicine Sleep Medicine

## 2020-04-30 NOTE — Progress Notes (Signed)
Pulmonary Critical Care Medicine [facility] [place]   PULMONARY CRITICAL CARE SERVICE  PROGRESS NOTE  Date of Service: 04/30/2020  Kathleen Cameron  [id number]:[id number]  DOB: July 04, 1965   DOA: 04/10/2020  Referring Physician: Carron Curie, MD  HPI: Kathleen Cameron is a 55 y.o. female seen for follow up of Acute on Chronic Respiratory Failure.  She is doing about the same remains on the ventilator she failed attempts at pressure support once again  Medications: Reviewed on Rounds  Physical Exam:  Vitals: Temperature is 98.0 pulse 89 respiratory rate 32 blood pressure is 125/79 saturations 97%  Ventilator Settings on pressure assist control IP 23 PEEP 5 tidal volume 420  . General: Comfortable at this time . Eyes: Grossly normal lids, irises & conjunctiva . ENT: grossly tongue is normal . Neck: no obvious mass . Cardiovascular: S1 S2 normal no gallop . Respiratory: No rhonchi coarse breath sounds . Abdomen: soft . Skin: no rash seen on limited exam . Musculoskeletal: not rigid . Psychiatric:unable to assess . Neurologic: no seizure no involuntary movements         Lab Data:   Basic Metabolic Panel: Recent Labs  Lab 04/23/20 1530 04/25/20 0643 04/28/20 0559  NA 141 142 140  K 4.4 4.1 4.9  CL 89* 92* 91*  CO2 42* 40* 38*  GLUCOSE 170* 121* 173*  BUN 44* 45* 39*  CREATININE 0.45 0.53 0.36*  CALCIUM 9.0 8.8* 8.9  MG  --   --  2.0    ABG: Recent Labs  Lab 04/23/20 1131 04/24/20 0932  PHART 7.313* 7.460*  PCO2ART 90.1* 63.9*  PO2ART 114* 70.2*  HCO3 44.9* 44.9*  O2SAT 98.6 95.2    Liver Function Tests: No results for input(s): AST, ALT, ALKPHOS, BILITOT, PROT, ALBUMIN in the last 168 hours. No results for input(s): LIPASE, AMYLASE in the last 168 hours. No results for input(s): AMMONIA in the last 168 hours.  CBC: Recent Labs  Lab 04/23/20 1653 04/25/20 0805 04/25/20 1458 04/26/20 0301 04/28/20 0559  WBC 6.8 8.1  --  8.5 8.7  HGB  7.8* 6.0* 8.7* 7.8* 9.5*  HCT 24.5* 19.3* 28.2* 26.0* 31.3*  MCV 103.8* 103.8*  --  102.0* 104.7*  PLT 138* 149*  --  137* 176    Cardiac Enzymes: No results for input(s): CKTOTAL, CKMB, CKMBINDEX, TROPONINI in the last 168 hours.  BNP (last 3 results) Recent Labs    09/06/19 1414 01/28/20 1503 02/25/20 1115  BNP 32.2 106.8* 282.7*    ProBNP (last 3 results) No results for input(s): PROBNP in the last 8760 hours.  Radiological Exams: No results found.  Assessment/Plan Active Problems:   Acute on chronic respiratory failure with hypoxia (HCC)   Seizure disorder (HCC)   COPD, severe (HCC)   Anxiety disorder   1. Acute on chronic respiratory failure hypoxia patient continues on full support on the ventilator.  Discussed yesterday possibility of starting on theophylline waiting on pharmacy to go ahead and start it 2. Very severe COPD FEV1 29% overall very poor prognosis. 3. Seizure disorder there is no active seizures noted. 4. Anxiety disorder she is at baseline   I have personally seen and evaluated the patient, evaluated laboratory and imaging results, formulated the assessment and plan and placed orders. The Patient requires high complexity decision making with multiple systems involvement.  Rounds were done with the Respiratory Therapy Director and Staff therapists and discussed with nursing staff also.  Yevonne Pax, MD [facility] Pulmonary Critical Care Medicine  Sleep Medicine

## 2020-05-01 NOTE — Progress Notes (Signed)
Pulmonary Critical Care Medicine [facility] [place]   PULMONARY CRITICAL CARE SERVICE  PROGRESS NOTE  Date of Service: 05/01/2020  Kathleen Cameron  VVO:[id number]  DOB: 04-Mar-1966   DOA: 04/10/2020  Referring Physician: Carron Curie, MD  HPI: Kathleen Cameron is a 55 y.o. female seen for follow up of Acute on Chronic Respiratory Failure.  Patient is on full support right now on pressure control mode she has not been tolerating any attempts at weaning with her underlying very severe obstructive lung disease  Medications: Reviewed on Rounds  Physical Exam:  Vitals: Temperature is 99.1 pulse 90 respiratory rate 22 blood pressure is 98/58 saturations 100%  Ventilator Settings on pressure assist control FiO2 40% IP 23 PEEP 5  . General: Comfortable at this time . Eyes: Grossly normal lids, irises & conjunctiva . ENT: grossly tongue is normal . Neck: no obvious mass . Cardiovascular: S1 S2 normal no gallop . Respiratory: Scattered rhonchi very coarse breath sounds . Abdomen: soft . Skin: no rash seen on limited exam . Musculoskeletal: not rigid . Psychiatric:unable to assess . Neurologic: no seizure no involuntary movements         Lab Data:   Basic Metabolic Panel: Recent Labs  Lab 04/25/20 0643 04/28/20 0559  NA 142 140  K 4.1 4.9  CL 92* 91*  CO2 40* 38*  GLUCOSE 121* 173*  BUN 45* 39*  CREATININE 0.53 0.36*  CALCIUM 8.8* 8.9  MG  --  2.0    ABG: No results for input(s): PHART, PCO2ART, PO2ART, HCO3, O2SAT in the last 168 hours.  Liver Function Tests: No results for input(s): AST, ALT, ALKPHOS, BILITOT, PROT, ALBUMIN in the last 168 hours. No results for input(s): LIPASE, AMYLASE in the last 168 hours. No results for input(s): AMMONIA in the last 168 hours.  CBC: Recent Labs  Lab 04/25/20 0805 04/25/20 1458 04/26/20 0301 04/28/20 0559  WBC 8.1  --  8.5 8.7  HGB 6.0* 8.7* 7.8* 9.5*  HCT 19.3* 28.2* 26.0* 31.3*  MCV 103.8*  --   102.0* 104.7*  PLT 149*  --  137* 176    Cardiac Enzymes: No results for input(s): CKTOTAL, CKMB, CKMBINDEX, TROPONINI in the last 168 hours.  BNP (last 3 results) Recent Labs    09/06/19 1414 01/28/20 1503 02/25/20 1115  BNP 32.2 106.8* 282.7*    ProBNP (last 3 results) No results for input(s): PROBNP in the last 8760 hours.  Radiological Exams: No results found.  Assessment/Plan Active Problems:   Acute on chronic respiratory failure with hypoxia (HCC)   Seizure disorder (HCC)   COPD, severe (HCC)   Anxiety disorder   1. Acute on chronic respiratory failure with hypoxia we will continue with the pressure control mode patient's been on 40% FiO2 she is not yet tolerating any weaning attempts. 2. Seizure disorder 3. Decision 4. Severe COPD medical management 5. Anxiety will be continued on current management   I have personally seen and evaluated the patient, evaluated laboratory and imaging results, formulated the assessment and plan and placed orders. The Patient requires high complexity decision making with multiple systems involvement.  Rounds were done with the Respiratory Therapy Director and Staff therapists and discussed with nursing staff also.  Yevonne Pax, MD [facility] Pulmonary Critical Care Medicine Sleep Medicine

## 2020-05-02 LAB — CBC
HCT: 24.5 % — ABNORMAL LOW (ref 36.0–46.0)
Hemoglobin: 7.3 g/dL — ABNORMAL LOW (ref 12.0–15.0)
MCH: 31.1 pg (ref 26.0–34.0)
MCHC: 29.8 g/dL — ABNORMAL LOW (ref 30.0–36.0)
MCV: 104.3 fL — ABNORMAL HIGH (ref 80.0–100.0)
Platelets: 153 10*3/uL (ref 150–400)
RBC: 2.35 MIL/uL — ABNORMAL LOW (ref 3.87–5.11)
RDW: 16.8 % — ABNORMAL HIGH (ref 11.5–15.5)
WBC: 6.7 10*3/uL (ref 4.0–10.5)
nRBC: 0.6 % — ABNORMAL HIGH (ref 0.0–0.2)

## 2020-05-02 LAB — BASIC METABOLIC PANEL
Anion gap: 10 (ref 5–15)
BUN: 41 mg/dL — ABNORMAL HIGH (ref 6–20)
CO2: 40 mmol/L — ABNORMAL HIGH (ref 22–32)
Calcium: 8.8 mg/dL — ABNORMAL LOW (ref 8.9–10.3)
Chloride: 92 mmol/L — ABNORMAL LOW (ref 98–111)
Creatinine, Ser: 0.41 mg/dL — ABNORMAL LOW (ref 0.44–1.00)
GFR, Estimated: >60 mL/min (ref >60)
Glucose, Bld: 190 mg/dL — ABNORMAL HIGH (ref 70–99)
Potassium: 4.4 mmol/L (ref 3.5–5.1)
Sodium: 142 mmol/L (ref 135–145)

## 2020-05-02 LAB — MAGNESIUM: Magnesium: 2 mg/dL (ref 1.7–2.4)

## 2020-05-02 NOTE — Progress Notes (Signed)
Pulmonary Critical Care Medicine [facility] [place]   PULMONARY CRITICAL CARE SERVICE  PROGRESS NOTE  Date of Service: 05/02/2020  Kathleen Cameron  [id number]  DOB: Apr 09, 1966   DOA: 04/10/2020  Referring Physician: Carron Curie, MD  HPI: Kathleen Cameron is a 55 y.o. female seen for follow up of Acute on Chronic Respiratory Failure.  Patient currently is on pressure assist control mode has been on 40% FiO2 with an IP of 23 PEEP of 5  Medications: Reviewed on Rounds  Physical Exam:  Vitals: Temperature is 98.6 pulse 82 respiratory rate 30 blood pressure is 104/70 saturations 98%  Ventilator Settings on pressure assist control FiO2 of 40% IP 23 PEEP 5  . General: Comfortable at this time . Eyes: Grossly normal lids, irises & conjunctiva . ENT: grossly tongue is normal . Neck: no obvious mass . Cardiovascular: S1 S2 normal no gallop . Respiratory: No rhonchi very coarse breath sounds . Abdomen: soft . Skin: no rash seen on limited exam . Musculoskeletal: not rigid . Psychiatric:unable to assess . Neurologic: no seizure no involuntary movements         Lab Data:   Basic Metabolic Panel: Recent Labs  Lab 04/28/20 0559 05/02/20 0423  NA 140 142  K 4.9 4.4  CL 91* 92*  CO2 38* 40*  GLUCOSE 173* 190*  BUN 39* 41*  CREATININE 0.36* 0.41*  CALCIUM 8.9 8.8*  MG 2.0 2.0    ABG: No results for input(s): PHART, PCO2ART, PO2ART, HCO3, O2SAT in the last 168 hours.  Liver Function Tests: No results for input(s): AST, ALT, ALKPHOS, BILITOT, PROT, ALBUMIN in the last 168 hours. No results for input(s): LIPASE, AMYLASE in the last 168 hours. No results for input(s): AMMONIA in the last 168 hours.  CBC: Recent Labs  Lab 04/25/20 1458 04/26/20 0301 04/28/20 0559 05/02/20 0423  WBC  --  8.5 8.7 6.7  HGB 8.7* 7.8* 9.5* 7.3*  HCT 28.2* 26.0* 31.3* 24.5*  MCV  --  102.0* 104.7* 104.3*  PLT  --  137* 176 153    Cardiac Enzymes: No results for  input(s): CKTOTAL, CKMB, CKMBINDEX, TROPONINI in the last 168 hours.  BNP (last 3 results) Recent Labs    09/06/19 1414 01/28/20 1503 02/25/20 1115  BNP 32.2 106.8* 282.7*    ProBNP (last 3 results) No results for input(s): PROBNP in the last 8760 hours.  Radiological Exams: No results found.  Assessment/Plan Active Problems:   Acute on chronic respiratory failure with hypoxia (HCC)   Seizure disorder (HCC)   COPD, severe (HCC)   Anxiety disorder   1. Acute on chronic respiratory failure hypoxia plan is to continue with full support on the ventilator patient's not tolerating any weaning whatsoever 2. Seizure disorder no active seizure noted at this time. 3. Severe COPD patient is at baseline 4. Anxiety appears to be better controlled   I have personally seen and evaluated the patient, evaluated laboratory and imaging results, formulated the assessment and plan and placed orders. The Patient requires high complexity decision making with multiple systems involvement.  Rounds were done with the Respiratory Therapy Director and Staff therapists and discussed with nursing staff also.  Yevonne Pax, MD [facility] Pulmonary Critical Care Medicine Sleep Medicine

## 2020-05-03 NOTE — Progress Notes (Signed)
Pulmonary Critical Care Medicine [facility] [place]   PULMONARY CRITICAL CARE SERVICE  PROGRESS NOTE  Date of Service: 05/03/2020  Kathleen Cameron  [id number]:[id number]  DOB: [birth year]   DOA: 04/10/2020  Referring Physician: Carron Curie, MD  HPI: Kathleen Cameron is a 55 y.o. female seen for follow up of Acute on Chronic Respiratory Failure.  Patient is on assist control comfortable right now without distress at this time she has not been making any headway as far as being able to wean  Medications: Reviewed on Rounds  Physical Exam:  Vitals: Temperature 97.0 pulse 92 respiratory 20 blood pressure is 129/62 saturations 98%  Ventilator Settings on assist control FiO2 is 50% PEEP of seven  . General: Comfortable at this time . Eyes: Grossly normal lids, irises & conjunctiva . ENT: grossly tongue is normal . Neck: no obvious mass . Cardiovascular: S1 S2 normal no gallop . Respiratory: No rhonchi very coarse breath sounds . Abdomen: soft . Skin: no rash seen on limited exam . Musculoskeletal: not rigid . Psychiatric:unable to assess . Neurologic: no seizure no involuntary movements         Lab Data:   Basic Metabolic Panel: Recent Labs  Lab 04/28/20 0559 05/02/20 0423  NA 140 142  K 4.9 4.4  CL 91* 92*  CO2 38* 40*  GLUCOSE 173* 190*  BUN 39* 41*  CREATININE 0.36* 0.41*  CALCIUM 8.9 8.8*  MG 2.0 2.0    ABG: No results for input(s): PHART, PCO2ART, PO2ART, HCO3, O2SAT in the last 168 hours.  Liver Function Tests: No results for input(s): AST, ALT, ALKPHOS, BILITOT, PROT, ALBUMIN in the last 168 hours. No results for input(s): LIPASE, AMYLASE in the last 168 hours. No results for input(s): AMMONIA in the last 168 hours.  CBC: Recent Labs  Lab 04/28/20 0559 05/02/20 0423  WBC 8.7 6.7  HGB 9.5* 7.3*  HCT 31.3* 24.5*  MCV 104.7* 104.3*  PLT 176 153    Cardiac Enzymes: No results for input(s): CKTOTAL, CKMB, CKMBINDEX, TROPONINI in the  last 168 hours.  BNP (last 3 results) Recent Labs    09/06/19 1414 01/28/20 1503 02/25/20 1115  BNP 32.2 106.8* 282.7*    ProBNP (last 3 results) No results for input(s): PROBNP in the last 8760 hours.  Radiological Exams: No results found.  Assessment/Plan Active Problems:   Acute on chronic respiratory failure with hypoxia (HCC)   Seizure disorder (HCC)   COPD, severe (HCC)   Anxiety disorder   1. Acute on chronic respiratory failure hypoxia she is at her baseline on full support on the ventilator.  She is not going to be able to come off the ventilator based on her severe underlying pulmonary disease 2. Seizure disorder no active seizure noted at this time. 3. Severe COPD patient at baseline 4. Anxiety disorder no change we will continue to monitor   I have personally seen and evaluated the patient, evaluated laboratory and imaging results, formulated the assessment and plan and placed orders. The Patient requires high complexity decision making with multiple systems involvement.  Rounds were done with the Respiratory Therapy Director and Staff therapists and discussed with nursing staff also.  Yevonne Pax, MD [facility] Pulmonary Critical Care Medicine Sleep Medicine

## 2020-05-04 NOTE — Progress Notes (Signed)
Pulmonary Critical Care Medicine [facility] [place]   PULMONARY CRITICAL CARE SERVICE  PROGRESS NOTE  Date of Service: 05/04/2020  Kathleen Cameron  IRJ:[id number]  DOB: April 21, 1966   DOA: 04/10/2020  Referring Physician: Carron Curie, MD  HPI: Kathleen Cameron is a 55 y.o. female seen for follow up of Acute on Chronic Respiratory Failure.  Patient currently is on pressure control mode has been on 40% FiO2 seems to be doing well so far  Medications: Reviewed on Rounds  Physical Exam:  Vitals: Temperature is 98.2 pulse 79 respiratory rate is 28 blood pressure is 119/78 saturations 98%  Ventilator Settings on pressure assist control FiO2 is 40% IP 23 PEEP seven  . General: Comfortable at this time . Eyes: Grossly normal lids, irises & conjunctiva . ENT: grossly tongue is normal . Neck: no obvious mass . Cardiovascular: S1 S2 normal no gallop . Respiratory: Scattered rhonchi expansion is equal at this time . Abdomen: soft . Skin: no rash seen on limited exam . Musculoskeletal: not rigid . Psychiatric:unable to assess . Neurologic: no seizure no involuntary movements         Lab Data:   Basic Metabolic Panel: Recent Labs  Lab 04/28/20 0559 05/02/20 0423  NA 140 142  K 4.9 4.4  CL 91* 92*  CO2 38* 40*  GLUCOSE 173* 190*  BUN 39* 41*  CREATININE 0.36* 0.41*  CALCIUM 8.9 8.8*  MG 2.0 2.0    ABG: No results for input(s): PHART, PCO2ART, PO2ART, HCO3, O2SAT in the last 168 hours.  Liver Function Tests: No results for input(s): AST, ALT, ALKPHOS, BILITOT, PROT, ALBUMIN in the last 168 hours. No results for input(s): LIPASE, AMYLASE in the last 168 hours. No results for input(s): AMMONIA in the last 168 hours.  CBC: Recent Labs  Lab 04/28/20 0559 05/02/20 0423  WBC 8.7 6.7  HGB 9.5* 7.3*  HCT 31.3* 24.5*  MCV 104.7* 104.3*  PLT 176 153    Cardiac Enzymes: No results for input(s): CKTOTAL, CKMB, CKMBINDEX, TROPONINI in the last 168  hours.  BNP (last 3 results) Recent Labs    09/06/19 1414 01/28/20 1503 02/25/20 1115  BNP 32.2 106.8* 282.7*    ProBNP (last 3 results) No results for input(s): PROBNP in the last 8760 hours.  Radiological Exams: No results found.  Assessment/Plan Active Problems:   Acute on chronic respiratory failure with hypoxia (HCC)   Seizure disorder (HCC)   COPD, severe (HCC)   Anxiety disorder   1. Acute on chronic respiratory failure with hypoxia we will continue with pressure control mode on 40% FiO2 with a PEEP of seven 2. Seizure disorder there is no active seizures noted at this time 3. Severe COPD at baseline 4. Anxiety disorder supportive care   I have personally seen and evaluated the patient, evaluated laboratory and imaging results, formulated the assessment and plan and placed orders. The Patient requires high complexity decision making with multiple systems involvement.  Rounds were done with the Respiratory Therapy Director and Staff therapists and discussed with nursing staff also.  Yevonne Pax, MD [facility] Pulmonary Critical Care Medicine Sleep Medicine

## 2020-05-05 NOTE — Progress Notes (Signed)
Pulmonary Critical Care Medicine [facility] [place]   PULMONARY CRITICAL CARE SERVICE  PROGRESS NOTE  Date of Service: 05/05/2020  Kathleen Cameron  NOB:[id number]  DOB: 1965/08/16   DOA: 04/10/2020  Referring Physician: Carron Curie, MD  HPI: Kathleen Cameron is a 55 y.o. female seen for follow up of Acute on Chronic Respiratory Failure.  Patient is on pressure pulmonary now on 40% FiO2  Medications: Reviewed on Rounds  Physical Exam:  Vitals: Temperature is 97.6 pulse 77 respiratory 21 blood pressure is 118/56 saturations 100  Ventilator Settings on pressure support FiO2 40% pressure 14/7  . General: Comfortable at this time . Eyes: Grossly normal lids, irises & conjunctiva . ENT: grossly tongue is normal . Neck: no obvious mass . Cardiovascular: S1 S2 normal no gallop . Respiratory: No rhonchi very coarse breath . Abdomen: soft . Skin: no rash seen on limited exam . Musculoskeletal: not rigid . Psychiatric:unable to assess . Neurologic: no seizure no involuntary movements         Lab Data:   Basic Metabolic Panel: Recent Labs  Lab 05/02/20 0423  NA 142  K 4.4  CL 92*  CO2 40*  GLUCOSE 190*  BUN 41*  CREATININE 0.41*  CALCIUM 8.8*  MG 2.0    ABG: No results for input(s): PHART, PCO2ART, PO2ART, HCO3, O2SAT in the last 168 hours.  Liver Function Tests: No results for input(s): AST, ALT, ALKPHOS, BILITOT, PROT, ALBUMIN in the last 168 hours. No results for input(s): LIPASE, AMYLASE in the last 168 hours. No results for input(s): AMMONIA in the last 168 hours.  CBC: Recent Labs  Lab 05/02/20 0423  WBC 6.7  HGB 7.3*  HCT 24.5*  MCV 104.3*  PLT 153    Cardiac Enzymes: No results for input(s): CKTOTAL, CKMB, CKMBINDEX, TROPONINI in the last 168 hours.  BNP (last 3 results) Recent Labs    09/06/19 1414 01/28/20 1503 02/25/20 1115  BNP 32.2 106.8* 282.7*    ProBNP (last 3 results) No results for input(s): PROBNP in the  last 8760 hours.  Radiological Exams: No results found.  Assessment/Plan Active Problems:   Acute on chronic respiratory failure with hypoxia (HCC)   Seizure disorder (HCC)   COPD, severe (HCC)   Anxiety disorder   1. Acute on chronic respiratory failure hypoxia we will continue with pressure support oxygen is at 40% FiO2 we will continue to titrate as tolerated. 2. Seizure disorder no active seizure noted 3. Severe COPD medical management 4. Anxiety disorder patient is at baseline   I have personally seen and evaluated the patient, evaluated laboratory and imaging results, formulated the assessment and plan and placed orders. The Patient requires high complexity decision making with multiple systems involvement.  Rounds were done with the Respiratory Therapy Director and Staff therapists and discussed with nursing staff also.  Yevonne Pax, MD [facility] Pulmonary Critical Care Medicine Sleep Medicine

## 2020-05-06 ENCOUNTER — Other Ambulatory Visit (HOSPITAL_COMMUNITY): Payer: Self-pay

## 2020-05-07 NOTE — Progress Notes (Signed)
Pulmonary Critical Care Medicine [facility] [place]   PULMONARY CRITICAL CARE SERVICE  PROGRESS NOTE  Date of Service: 05/07/2020  [name]  TMH:[id number]  DOB: 1965/07/16   DOA: 04/10/2020  Referring Physician: Carron Curie, MD  HPI: Kathleen Cameron is a 55 y.o. female seen for follow up of Acute on Chronic Respiratory Failure.  Patient currently is on pressure control has been on 40% FiO2  Medications: Reviewed on Rounds  Physical Exam:  Vitals: Temperature is 97.2 pulse 86 respiratory rate 24 blood pressure 133/77 saturations 100%  Ventilator Settings on pressure control FiO2 40% IP 23 PEEP 7  . General: Comfortable at this time . Eyes: Grossly normal lids, irises & conjunctiva . ENT: grossly tongue is normal . Neck: no obvious mass . Cardiovascular: S1 S2 normal no gallop . Respiratory: No rhonchi no rales . Abdomen: soft . Skin: no rash seen on limited exam . Musculoskeletal: not rigid . Psychiatric:unable to assess . Neurologic: no seizure no involuntary movements         Lab Data:   Basic Metabolic Panel: Recent Labs  Lab 05/02/20 0423  NA 142  K 4.4  CL 92*  CO2 40*  GLUCOSE 190*  BUN 41*  CREATININE 0.41*  CALCIUM 8.8*  MG 2.0    ABG: No results for input(s): PHART, PCO2ART, PO2ART, HCO3, O2SAT in the last 168 hours.  Liver Function Tests: No results for input(s): AST, ALT, ALKPHOS, BILITOT, PROT, ALBUMIN in the last 168 hours. No results for input(s): LIPASE, AMYLASE in the last 168 hours. No results for input(s): AMMONIA in the last 168 hours.  CBC: Recent Labs  Lab 05/02/20 0423  WBC 6.7  HGB 7.3*  HCT 24.5*  MCV 104.3*  PLT 153    Cardiac Enzymes: No results for input(s): CKTOTAL, CKMB, CKMBINDEX, TROPONINI in the last 168 hours.  BNP (last 3 results) Recent Labs    09/06/19 1414 01/28/20 1503 02/25/20 1115  BNP 32.2 106.8* 282.7*    ProBNP (last 3 results) No results for input(s): PROBNP in  the last 8760 hours.  Radiological Exams: DG Abd Portable 1V  Result Date: 05/06/2020 CLINICAL DATA:  Abdominal pain and distension. EXAM: PORTABLE ABDOMEN - 1 VIEW COMPARISON:  April 26, 2020 FINDINGS: There is stable percutaneous gastrostomy tube positioning. The bowel gas pattern is normal. A large amount of stool is seen throughout the colon. No radio-opaque calculi or other significant radiographic abnormality are seen. IMPRESSION: 1. Large stool burden without evidence of bowel obstruction. Electronically Signed   By: Aram Candela M.D.   On: 05/06/2020 17:27    Assessment/Plan Active Problems:   Acute on chronic respiratory failure with hypoxia (HCC)   Seizure disorder (HCC)   COPD, severe (HCC)   Anxiety disorder   1. Acute on chronic respiratory failure hypoxia we will continue with pressure control titrate oxygen continue pulmonary toilet. 2. Seizure disorder no active seizures 3. Severe COPD at baseline 4. Anxiety disorder no change we will continue to monitor   I have personally seen and evaluated the patient, evaluated laboratory and imaging results, formulated the assessment and plan and placed orders. The Patient requires high complexity decision making with multiple systems involvement.  Rounds were done with the Respiratory Therapy Director and Staff therapists and discussed with nursing staff also.  Yevonne Pax, MD [facility] Pulmonary Critical Care Medicine Sleep Medicine

## 2020-05-08 NOTE — Progress Notes (Signed)
Pulmonary Critical Care Medicine [facility] [place]   PULMONARY CRITICAL CARE SERVICE  PROGRESS NOTE  Date of Service: 05/08/2020  Kathleen Cameron  KGU:[id number]  DOB: January 01, 1966   DOA: 04/10/2020  Referring Physician: Carron Curie, MD  HPI: Kathleen Cameron is a 55 y.o. female seen for follow up of Acute on Chronic Respiratory Failure.  Patient currently is pressure control has been on 40% FiO2 with an IP of 23  Medications: Reviewed on Rounds  Physical Exam:  Vitals: Temperature is 97.0 pulse 89 respiratory rate 22 blood pressure is 156/94 saturations 100%  Ventilator Settings on pressure control FiO2 40% IP 23 PEEP 7  . General: Comfortable at this time . Eyes: Grossly normal lids, irises & conjunctiva . ENT: grossly tongue is normal . Neck: no obvious mass . Cardiovascular: S1 S2 normal no gallop . Respiratory: No rhonchi very coarse breath sounds . Abdomen: soft . Skin: no rash seen on limited exam . Musculoskeletal: not rigid . Psychiatric:unable to assess . Neurologic: no seizure no involuntary movements         Lab Data:   Basic Metabolic Panel: Recent Labs  Lab 05/02/20 0423  NA 142  K 4.4  CL 92*  CO2 40*  GLUCOSE 190*  BUN 41*  CREATININE 0.41*  CALCIUM 8.8*  MG 2.0    ABG: No results for input(s): PHART, PCO2ART, PO2ART, HCO3, O2SAT in the last 168 hours.  Liver Function Tests: No results for input(s): AST, ALT, ALKPHOS, BILITOT, PROT, ALBUMIN in the last 168 hours. No results for input(s): LIPASE, AMYLASE in the last 168 hours. No results for input(s): AMMONIA in the last 168 hours.  CBC: Recent Labs  Lab 05/02/20 0423  WBC 6.7  HGB 7.3*  HCT 24.5*  MCV 104.3*  PLT 153    Cardiac Enzymes: No results for input(s): CKTOTAL, CKMB, CKMBINDEX, TROPONINI in the last 168 hours.  BNP (last 3 results) Recent Labs    09/06/19 1414 01/28/20 1503 02/25/20 1115  BNP 32.2 106.8* 282.7*    ProBNP (last 3 results) No  results for input(s): PROBNP in the last 8760 hours.  Radiological Exams: DG Abd Portable 1V  Result Date: 05/06/2020 CLINICAL DATA:  Abdominal pain and distension. EXAM: PORTABLE ABDOMEN - 1 VIEW COMPARISON:  April 26, 2020 FINDINGS: There is stable percutaneous gastrostomy tube positioning. The bowel gas pattern is normal. A large amount of stool is seen throughout the colon. No radio-opaque calculi or other significant radiographic abnormality are seen. IMPRESSION: 1. Large stool burden without evidence of bowel obstruction. Electronically Signed   By: Aram Candela M.D.   On: 05/06/2020 17:27    Assessment/Plan Active Problems:   Acute on chronic respiratory failure with hypoxia (HCC)   Seizure disorder (HCC)   COPD, severe (HCC)   Anxiety disorder   1. Acute on chronic respiratory failure with hypoxia we will continue with pressure control titrate oxygen continue pulmonary toilet. 2. Seizure disorder there is no active seizures noted at this time. 3. Severe COPD at baseline we will continue to follow 4. Anxiety disorder no change   I have personally seen and evaluated the patient, evaluated laboratory and imaging results, formulated the assessment and plan and placed orders. The Patient requires high complexity decision making with multiple systems involvement.  Rounds were done with the Respiratory Therapy Director and Staff therapists and discussed with nursing staff also.  Yevonne Pax, MD [facility] Pulmonary Critical Care Medicine Sleep Medicine

## 2020-05-09 NOTE — Progress Notes (Signed)
Pulmonary Critical Care Medicine [facility] [place]   PULMONARY CRITICAL CARE SERVICE  PROGRESS NOTE  Date of Service: 05/09/2020  Kathleen Cameron  [id number]:[id number]  DOB: 02/07/1966   DOA: 04/10/2020  Referring Physician: Carron Curie, MD  HPI: Kathleen Cameron is a 56 y.o. female seen for follow up of Acute on Chronic Respiratory Failure.  Patient at this time is on pressure control she is intermittently been weaning this morning was not yet weaning  Medications: Reviewed on Rounds  Physical Exam:  Vitals: Temperature is 97.4 pulse 96 respiratory rate 32 blood pressure is 127/79 saturations 100%  Ventilator Settings on pressure control FiO2 is 40% PEEP 7  . General: Comfortable at this time . Eyes: Grossly normal lids, irises & conjunctiva . ENT: grossly tongue is normal . Neck: no obvious mass . Cardiovascular: S1 S2 normal no gallop . Respiratory: No rhonchi coarse breath sounds . Abdomen: soft . Skin: no rash seen on limited exam . Musculoskeletal: not rigid . Psychiatric:unable to assess . Neurologic: no seizure no involuntary movements         Lab Data:   Basic Metabolic Panel: No results for input(s): NA, K, CL, CO2, GLUCOSE, BUN, CREATININE, CALCIUM, MG, PHOS in the last 168 hours.  ABG: No results for input(s): PHART, PCO2ART, PO2ART, HCO3, O2SAT in the last 168 hours.  Liver Function Tests: No results for input(s): AST, ALT, ALKPHOS, BILITOT, PROT, ALBUMIN in the last 168 hours. No results for input(s): LIPASE, AMYLASE in the last 168 hours. No results for input(s): AMMONIA in the last 168 hours.  CBC: No results for input(s): WBC, NEUTROABS, HGB, HCT, MCV, PLT in the last 168 hours.  Cardiac Enzymes: No results for input(s): CKTOTAL, CKMB, CKMBINDEX, TROPONINI in the last 168 hours.  BNP (last 3 results) Recent Labs    09/06/19 1414 01/28/20 1503 02/25/20 1115  BNP 32.2 106.8* 282.7*    ProBNP (last 3 results) No results for  input(s): PROBNP in the last 8760 hours.  Radiological Exams: No results found.  Assessment/Plan Active Problems:   Acute on chronic respiratory failure with hypoxia (HCC)   Seizure disorder (HCC)   COPD, severe (HCC)   Anxiety disorder   1. Acute on chronic respiratory failure hypoxia we will continue with pressure control mode patient right now is on 40% FiO2 she has not been consistently weaning 2. Seizure disorder there is no active seizures 3. Severe COPD medical management 4. Anxiety disorder at baseline   I have personally seen and evaluated the patient, evaluated laboratory and imaging results, formulated the assessment and plan and placed orders. The Patient requires high complexity decision making with multiple systems involvement.  Rounds were done with the Respiratory Therapy Director and Staff therapists and discussed with nursing staff also.  Yevonne Pax, MD [facility] Pulmonary Critical Care Medicine Sleep Medicine

## 2020-05-10 LAB — CBC
HCT: 22 % — ABNORMAL LOW (ref 36.0–46.0)
Hemoglobin: 6.8 g/dL — CL (ref 12.0–15.0)
MCH: 31.9 pg (ref 26.0–34.0)
MCHC: 30.9 g/dL (ref 30.0–36.0)
MCV: 103.3 fL — ABNORMAL HIGH (ref 80.0–100.0)
Platelets: 174 10*3/uL (ref 150–400)
RBC: 2.13 MIL/uL — ABNORMAL LOW (ref 3.87–5.11)
RDW: 17.4 % — ABNORMAL HIGH (ref 11.5–15.5)
WBC: 8.3 10*3/uL (ref 4.0–10.5)
nRBC: 0.7 % — ABNORMAL HIGH (ref 0.0–0.2)

## 2020-05-10 LAB — HEMOGLOBIN AND HEMATOCRIT, BLOOD
HCT: 26.5 % — ABNORMAL LOW (ref 36.0–46.0)
Hemoglobin: 8.4 g/dL — ABNORMAL LOW (ref 12.0–15.0)

## 2020-05-10 LAB — BASIC METABOLIC PANEL
Anion gap: 10 (ref 5–15)
BUN: 39 mg/dL — ABNORMAL HIGH (ref 6–20)
CO2: 36 mmol/L — ABNORMAL HIGH (ref 22–32)
Calcium: 8.9 mg/dL (ref 8.9–10.3)
Chloride: 98 mmol/L (ref 98–111)
Creatinine, Ser: 0.41 mg/dL — ABNORMAL LOW (ref 0.44–1.00)
GFR, Estimated: >60 mL/min (ref >60)
Glucose, Bld: 170 mg/dL — ABNORMAL HIGH (ref 70–99)
Potassium: 4 mmol/L (ref 3.5–5.1)
Sodium: 144 mmol/L (ref 135–145)

## 2020-05-10 LAB — MAGNESIUM: Magnesium: 2.2 mg/dL (ref 1.7–2.4)

## 2020-05-10 LAB — PREPARE RBC (CROSSMATCH)

## 2020-05-10 NOTE — Progress Notes (Signed)
Pulmonary Critical Care Medicine [facility] [place]   PULMONARY CRITICAL CARE SERVICE  PROGRESS NOTE  Date of Service: 05/10/2020  Kathleen Cameron  NUU:[id number]  DOB: 02/08/1966   DOA: 04/10/2020  Referring Physician: Carron Curie, MD  HPI: Kathleen Cameron is a 55 y.o. female seen for follow up of Acute on Chronic Respiratory Failure.  Patient currently is on pressure support on 40% FiO2 pressure of 12/7 the goal is for 8 hours  Medications: Reviewed on Rounds  Physical Exam:  Vitals: Temperature is 97.0 pulse 87 respiratory 24 blood pressure is 122/77 saturations 100%  Ventilator Settings on pressure support FiO2 40% pressure 12/7  . General: Comfortable at this time . Eyes: Grossly normal lids, irises & conjunctiva . ENT: grossly tongue is normal . Neck: no obvious mass . Cardiovascular: S1 S2 normal no gallop . Respiratory: No rhonchi very coarse breath . Abdomen: soft . Skin: no rash seen on limited exam . Musculoskeletal: not rigid . Psychiatric:unable to assess . Neurologic: no seizure no involuntary movements         Lab Data:   Basic Metabolic Panel: Recent Labs  Lab 05/10/20 0547  NA 144  K 4.0  CL 98  CO2 36*  GLUCOSE 170*  BUN 39*  CREATININE 0.41*  CALCIUM 8.9  MG 2.2    ABG: No results for input(s): PHART, PCO2ART, PO2ART, HCO3, O2SAT in the last 168 hours.  Liver Function Tests: No results for input(s): AST, ALT, ALKPHOS, BILITOT, PROT, ALBUMIN in the last 168 hours. No results for input(s): LIPASE, AMYLASE in the last 168 hours. No results for input(s): AMMONIA in the last 168 hours.  CBC: Recent Labs  Lab 05/10/20 0547  WBC 8.3  HGB 6.8*  HCT 22.0*  MCV 103.3*  PLT 174    Cardiac Enzymes: No results for input(s): CKTOTAL, CKMB, CKMBINDEX, TROPONINI in the last 168 hours.  BNP (last 3 results) Recent Labs    09/06/19 1414 01/28/20 1503 02/25/20 1115  BNP 32.2 106.8* 282.7*    ProBNP (last 3  results) No results for input(s): PROBNP in the last 8760 hours.  Radiological Exams: No results found.  Assessment/Plan Active Problems:   Acute on chronic respiratory failure with hypoxia (HCC)   Seizure disorder (HCC)   COPD, severe (HCC)   Anxiety disorder   1. Acute on chronic respiratory failure with hypoxia we will continue with pressure support titrate oxygen continue pulmonary toilet 2. Seizure disorder no active seizures noted at this time 3. Severe COPD at baseline 4. Anxiety disorder patient is at baseline   I have personally seen and evaluated the patient, evaluated laboratory and imaging results, formulated the assessment and plan and placed orders. The Patient requires high complexity decision making with multiple systems involvement.  Rounds were done with the Respiratory Therapy Director and Staff therapists and discussed with nursing staff also.  Yevonne Pax, MD [facility] Pulmonary Critical Care Medicine Sleep Medicine

## 2020-05-11 LAB — BPAM RBC
Blood Product Expiration Date: 202202212359
ISSUE DATE / TIME: 202201201326
Unit Type and Rh: 5100

## 2020-05-11 LAB — TYPE AND SCREEN
ABO/RH(D): O POS
Antibody Screen: NEGATIVE
Unit division: 0

## 2020-05-11 NOTE — Progress Notes (Signed)
Pulmonary Critical Care Medicine [facility] [place]   PULMONARY CRITICAL CARE SERVICE  PROGRESS NOTE  Date of Service: 05/11/2020  Kathleen Cameron  WUJ:[id number]  DOB: [birth year]   DOA: 04/10/2020  Referring Physician: Carron Curie, MD  HPI: Kathleen Cameron is a 55 y.o. female seen for follow up of Acute on Chronic Respiratory Failure.  Patient currently is on pressure support has been on 40% FiO2 currently on pressure of 14/7  Medications: Reviewed on Rounds  Physical Exam:  Vitals: Temperature is 97.2 pulse 74 respiratory rate is 28 blood pressure is 128/79 saturations 98  Ventilator Settings on pressure support FiO2 40% pressure 14/7  . General: Comfortable at this time . Eyes: Grossly normal lids, irises & conjunctiva . ENT: grossly tongue is normal . Neck: no obvious mass . Cardiovascular: S1 S2 normal no gallop . Respiratory: Scattered rhonchi expansion is equal . Abdomen: soft . Skin: no rash seen on limited exam . Musculoskeletal: not rigid . Psychiatric:unable to assess . Neurologic: no seizure no involuntary movements         Lab Data:   Basic Metabolic Panel: Recent Labs  Lab 05/10/20 0547  NA 144  K 4.0  CL 98  CO2 36*  GLUCOSE 170*  BUN 39*  CREATININE 0.41*  CALCIUM 8.9  MG 2.2    ABG: No results for input(s): PHART, PCO2ART, PO2ART, HCO3, O2SAT in the last 168 hours.  Liver Function Tests: No results for input(s): AST, ALT, ALKPHOS, BILITOT, PROT, ALBUMIN in the last 168 hours. No results for input(s): LIPASE, AMYLASE in the last 168 hours. No results for input(s): AMMONIA in the last 168 hours.  CBC: Recent Labs  Lab 05/10/20 0547 05/10/20 2217  WBC 8.3  --   HGB 6.8* 8.4*  HCT 22.0* 26.5*  MCV 103.3*  --   PLT 174  --     Cardiac Enzymes: No results for input(s): CKTOTAL, CKMB, CKMBINDEX, TROPONINI in the last 168 hours.  BNP (last 3 results) Recent Labs    09/06/19 1414 01/28/20 1503 02/25/20 1115   BNP 32.2 106.8* 282.7*    ProBNP (last 3 results) No results for input(s): PROBNP in the last 8760 hours.  Radiological Exams: No results found.  Assessment/Plan Active Problems:   Acute on chronic respiratory failure with hypoxia (HCC)   Seizure disorder (HCC)   COPD, severe (HCC)   Anxiety disorder   1. Acute on chronic respiratory failure hypoxia we will continue with pressure support goal of 12 hours 2. Seizure disorder no active seizure noted at this time. 3. Severe COPD at baseline 4. Anxiety disorder no change we will continue to follow along   I have personally seen and evaluated the patient, evaluated laboratory and imaging results, formulated the assessment and plan and placed orders. The Patient requires high complexity decision making with multiple systems involvement.  Rounds were done with the Respiratory Therapy Director and Staff therapists and discussed with nursing staff also.  Yevonne Pax, MD [facility] Pulmonary Critical Care Medicine Sleep Medicine

## 2020-05-12 ENCOUNTER — Other Ambulatory Visit (HOSPITAL_COMMUNITY): Payer: Self-pay

## 2020-05-12 NOTE — Progress Notes (Signed)
Pulmonary Critical Care Medicine [facility] [place]   PULMONARY CRITICAL CARE SERVICE  PROGRESS NOTE  Date of Service: 05/12/2020  Kathleen Cameron  [id number]  DOB: 31-Dec-1965   DOA: 04/10/2020  Referring Physician: Carron Curie, MD  HPI: Kathleen Cameron is a 55 y.o. female seen for follow up of Acute on Chronic Respiratory Failure.  Patient currently is on pressure support has been on 40% FiO2 with a pressure of 14/7  Medications: Reviewed on Rounds  Physical Exam:  Vitals: Temperature 97.4 pulse 64 respiratory rate 32 blood pressure is 132/84 saturations 98%  Ventilator Settings on pressure support FiO2 40% pressure of 14/7  . General: Comfortable at this time . Eyes: Grossly normal lids, irises & conjunctiva . ENT: grossly tongue is normal . Neck: no obvious mass . Cardiovascular: S1 S2 normal no gallop . Respiratory: No rhonchi very coarse breath sounds . Abdomen: soft . Skin: no rash seen on limited exam . Musculoskeletal: not rigid . Psychiatric:unable to assess . Neurologic: no seizure no involuntary movements         Lab Data:   Basic Metabolic Panel: Recent Labs  Lab 05/10/20 0547  NA 144  K 4.0  CL 98  CO2 36*  GLUCOSE 170*  BUN 39*  CREATININE 0.41*  CALCIUM 8.9  MG 2.2    ABG: No results for input(s): PHART, PCO2ART, PO2ART, HCO3, O2SAT in the last 168 hours.  Liver Function Tests: No results for input(s): AST, ALT, ALKPHOS, BILITOT, PROT, ALBUMIN in the last 168 hours. No results for input(s): LIPASE, AMYLASE in the last 168 hours. No results for input(s): AMMONIA in the last 168 hours.  CBC: Recent Labs  Lab 05/10/20 0547 05/10/20 2217  WBC 8.3  --   HGB 6.8* 8.4*  HCT 22.0* 26.5*  MCV 103.3*  --   PLT 174  --     Cardiac Enzymes: No results for input(s): CKTOTAL, CKMB, CKMBINDEX, TROPONINI in the last 168 hours.  BNP (last 3 results) Recent Labs    09/06/19 1414 01/28/20 1503 02/25/20 1115  BNP 32.2  106.8* 282.7*    ProBNP (last 3 results) No results for input(s): PROBNP in the last 8760 hours.  Radiological Exams: No results found.  Assessment/Plan Active Problems:   Acute on chronic respiratory failure with hypoxia (HCC)   Seizure disorder (HCC)   COPD, severe (HCC)   Anxiety disorder   1. Acute on chronic respiratory failure with hypoxia we will continue with pressure support weaning as tolerated the goal today is 16 hours 2. Seizure disorder no active seizure 3. COPD very severe disease 4. Anxiety disorder no change   I have personally seen and evaluated the patient, evaluated laboratory and imaging results, formulated the assessment and plan and placed orders. The Patient requires high complexity decision making with multiple systems involvement.  Rounds were done with the Respiratory Therapy Director and Staff therapists and discussed with nursing staff also.  Yevonne Pax, MD [facility] Pulmonary Critical Care Medicine Sleep Medicine

## 2020-05-13 LAB — CBC
HCT: 24.9 % — ABNORMAL LOW (ref 36.0–46.0)
Hemoglobin: 7.7 g/dL — ABNORMAL LOW (ref 12.0–15.0)
MCH: 32 pg (ref 26.0–34.0)
MCHC: 30.9 g/dL (ref 30.0–36.0)
MCV: 103.3 fL — ABNORMAL HIGH (ref 80.0–100.0)
Platelets: 147 10*3/uL — ABNORMAL LOW (ref 150–400)
RBC: 2.41 MIL/uL — ABNORMAL LOW (ref 3.87–5.11)
RDW: 17.1 % — ABNORMAL HIGH (ref 11.5–15.5)
WBC: 6 10*3/uL (ref 4.0–10.5)
nRBC: 1.3 % — ABNORMAL HIGH (ref 0.0–0.2)

## 2020-05-13 LAB — BASIC METABOLIC PANEL
Anion gap: 10 (ref 5–15)
BUN: 33 mg/dL — ABNORMAL HIGH (ref 6–20)
CO2: 33 mmol/L — ABNORMAL HIGH (ref 22–32)
Calcium: 8.8 mg/dL — ABNORMAL LOW (ref 8.9–10.3)
Chloride: 100 mmol/L (ref 98–111)
Creatinine, Ser: 0.34 mg/dL — ABNORMAL LOW (ref 0.44–1.00)
GFR, Estimated: >60 mL/min (ref >60)
Glucose, Bld: 144 mg/dL — ABNORMAL HIGH (ref 70–99)
Potassium: 4.4 mmol/L (ref 3.5–5.1)
Sodium: 143 mmol/L (ref 135–145)

## 2020-05-13 LAB — OCCULT BLOOD X 1 CARD TO LAB, STOOL: Fecal Occult Bld: POSITIVE — AB

## 2020-05-13 NOTE — Progress Notes (Signed)
Pulmonary Critical Care Medicine [facility] [place]   PULMONARY CRITICAL CARE SERVICE  PROGRESS NOTE  Date of Service: 05/13/2020  Kathleen Cameron  [id number]:[id number]  DOB: 1965-08-10   DOA: 04/10/2020  Referring Physician: Carron Curie, MD  HPI: Kathleen Cameron is a 55 y.o. female seen for follow up of Acute on Chronic Respiratory Failure.  Patient at this time is been weaning doing fairly well and was able to complete 16 hours of pressure support yesterday so today will be trying for T collar 2 hours after her anxiety medications are given  Medications: Reviewed on Rounds  Physical Exam:  Vitals: Temperature 98.4 pulse 82 respiratory 24 blood pressure is 129/83 saturations 98%  Ventilator Settings on wean pressure support 12/5  . General: Comfortable at this time . Eyes: Grossly normal lids, irises & conjunctiva . ENT: grossly tongue is normal . Neck: no obvious mass . Cardiovascular: S1 S2 normal no gallop . Respiratory: Scattered rhonchi coarse breath sounds . Abdomen: soft . Skin: no rash seen on limited exam . Musculoskeletal: not rigid . Psychiatric:unable to assess . Neurologic: no seizure no involuntary movements         Lab Data:   Basic Metabolic Panel: Recent Labs  Lab 05/10/20 0547 05/13/20 0428  NA 144 143  K 4.0 4.4  CL 98 100  CO2 36* 33*  GLUCOSE 170* 144*  BUN 39* 33*  CREATININE 0.41* 0.34*  CALCIUM 8.9 8.8*  MG 2.2  --     ABG: No results for input(s): PHART, PCO2ART, PO2ART, HCO3, O2SAT in the last 168 hours.  Liver Function Tests: No results for input(s): AST, ALT, ALKPHOS, BILITOT, PROT, ALBUMIN in the last 168 hours. No results for input(s): LIPASE, AMYLASE in the last 168 hours. No results for input(s): AMMONIA in the last 168 hours.  CBC: Recent Labs  Lab 05/10/20 0547 05/10/20 2217 05/13/20 0428  WBC 8.3  --  6.0  HGB 6.8* 8.4* 7.7*  HCT 22.0* 26.5* 24.9*  MCV 103.3*  --  103.3*  PLT 174  --  147*     Cardiac Enzymes: No results for input(s): CKTOTAL, CKMB, CKMBINDEX, TROPONINI in the last 168 hours.  BNP (last 3 results) Recent Labs    09/06/19 1414 01/28/20 1503 02/25/20 1115  BNP 32.2 106.8* 282.7*    ProBNP (last 3 results) No results for input(s): PROBNP in the last 8760 hours.  Radiological Exams: DG CHEST PORT 1 VIEW  Result Date: 05/12/2020 CLINICAL DATA:  Pneumonia, fever EXAM: PORTABLE CHEST 1 VIEW COMPARISON:  04/23/2020 FINDINGS: Mild patchy opacities in the left upper lobe, right upper lobe, and left lower lobe, suggesting mild multifocal pneumonia, similar to the prior. Possible small left pleural effusion. No pneumothorax. Tracheostomy in satisfactory position. The heart is normal in size. IMPRESSION: Suspected mild multifocal pneumonia, although grossly unchanged from 04/23/2020. Suspected small left pleural effusion. Electronically Signed   By: Charline Bills M.D.   On: 05/12/2020 14:26    Assessment/Plan Active Problems:   Acute on chronic respiratory failure with hypoxia (HCC)   Seizure disorder (HCC)   COPD, severe (HCC)   Anxiety disorder   1. Acute on chronic respiratory failure hypoxia we will attempt to T collar for 2 hours once the patient receives anxiety medications. 2. Seizure disorder no active seizures noted at this time. 3. Severe COPD at baseline we will continue to monitor 4. Anxiety disorder no change continue with supportive care   I have personally seen and evaluated the patient,  evaluated laboratory and imaging results, formulated the assessment and plan and placed orders. The Patient requires high complexity decision making with multiple systems involvement.  Rounds were done with the Respiratory Therapy Director and Staff therapists and discussed with nursing staff also.  Allyne Gee, MD [facility] Pulmonary Critical Care Medicine Sleep Medicine

## 2020-05-14 ENCOUNTER — Other Ambulatory Visit (HOSPITAL_COMMUNITY): Payer: Self-pay

## 2020-05-14 NOTE — Progress Notes (Signed)
Pulmonary Critical Care Medicine [facility] [place]   PULMONARY CRITICAL CARE SERVICE  PROGRESS NOTE  Date of Service: 05/14/2020  Lalena Salas  [id number]  DOB: 02-Oct-1965   DOA: 04/10/2020  Referring Physician: Carron Curie, MD  HPI: Kathleen Cameron is a 55 y.o. female seen for follow up of Acute on Chronic Respiratory Failure.  Patient at this time is on pressure control mode has been on 40% FiO2 with a PEEP of 7 doing fairly well did about 2 hours of T collar yesterday  Medications: Reviewed on Rounds  Physical Exam:  Vitals: Temperature 96.8 pulse 73 respiratory 24 blood pressure is 121/75 saturations 100%  Ventilator Settings on pressure control FiO2 40% PEEP 7 IP 23  . General: Comfortable at this time . Eyes: Grossly normal lids, irises & conjunctiva . ENT: grossly tongue is normal . Neck: no obvious mass . Cardiovascular: S1 S2 normal no gallop . Respiratory: No rhonchi coarse breath sounds . Abdomen: soft . Skin: no rash seen on limited exam . Musculoskeletal: not rigid . Psychiatric:unable to assess . Neurologic: no seizure no involuntary movements         Lab Data:   Basic Metabolic Panel: Recent Labs  Lab 05/10/20 0547 05/13/20 0428  NA 144 143  K 4.0 4.4  CL 98 100  CO2 36* 33*  GLUCOSE 170* 144*  BUN 39* 33*  CREATININE 0.41* 0.34*  CALCIUM 8.9 8.8*  MG 2.2  --     ABG: No results for input(s): PHART, PCO2ART, PO2ART, HCO3, O2SAT in the last 168 hours.  Liver Function Tests: No results for input(s): AST, ALT, ALKPHOS, BILITOT, PROT, ALBUMIN in the last 168 hours. No results for input(s): LIPASE, AMYLASE in the last 168 hours. No results for input(s): AMMONIA in the last 168 hours.  CBC: Recent Labs  Lab 05/10/20 0547 05/10/20 2217 05/13/20 0428  WBC 8.3  --  6.0  HGB 6.8* 8.4* 7.7*  HCT 22.0* 26.5* 24.9*  MCV 103.3*  --  103.3*  PLT 174  --  147*    Cardiac Enzymes: No results for input(s): CKTOTAL,  CKMB, CKMBINDEX, TROPONINI in the last 168 hours.  BNP (last 3 results) Recent Labs    09/06/19 1414 01/28/20 1503 02/25/20 1115  BNP 32.2 106.8* 282.7*    ProBNP (last 3 results) No results for input(s): PROBNP in the last 8760 hours.  Radiological Exams: DG CHEST PORT 1 VIEW  Result Date: 05/12/2020 CLINICAL DATA:  Pneumonia, fever EXAM: PORTABLE CHEST 1 VIEW COMPARISON:  04/23/2020 FINDINGS: Mild patchy opacities in the left upper lobe, right upper lobe, and left lower lobe, suggesting mild multifocal pneumonia, similar to the prior. Possible small left pleural effusion. No pneumothorax. Tracheostomy in satisfactory position. The heart is normal in size. IMPRESSION: Suspected mild multifocal pneumonia, although grossly unchanged from 04/23/2020. Suspected small left pleural effusion. Electronically Signed   By: Charline Bills M.D.   On: 05/12/2020 14:26    Assessment/Plan Active Problems:   Acute on chronic respiratory failure with hypoxia (HCC)   Seizure disorder (HCC)   COPD, severe (HCC)   Anxiety disorder   1. Acute on chronic respiratory failure hypoxia we will continue with the weaning attempts patient was able to do 2 hours of T collar yesterday we will try to build on that further 2. Seizure disorder there is no active seizure noted 3. Severe COPD medical management 4. Anxiety disorder patient is at baseline continue with supportive care medical management per primary care team  I have personally seen and evaluated the patient, evaluated laboratory and imaging results, formulated the assessment and plan and placed orders. The Patient requires high complexity decision making with multiple systems involvement.  Rounds were done with the Respiratory Therapy Director and Staff therapists and discussed with nursing staff also.  Allyne Gee, MD [facility] Pulmonary Critical Care Medicine Sleep Medicine

## 2020-05-15 ENCOUNTER — Other Ambulatory Visit (HOSPITAL_COMMUNITY): Payer: Self-pay

## 2020-05-15 LAB — BLOOD GAS, ARTERIAL
Acid-Base Excess: 11.2 mmol/L — ABNORMAL HIGH (ref 0.0–2.0)
Acid-Base Excess: 12.8 mmol/L — ABNORMAL HIGH (ref 0.0–2.0)
Bicarbonate: 41.2 mmol/L — ABNORMAL HIGH (ref 20.0–28.0)
Bicarbonate: 39.3 mmol/L — ABNORMAL HIGH (ref 20.0–28.0)
FIO2: 45
FIO2: 45
O2 Saturation: 87.4 %
O2 Saturation: 91.6 %
Patient temperature: 36.6
Patient temperature: 37
pCO2 arterial: >120 mmHg (ref 32.0–48.0)
pCO2 arterial: 80.4 mmHg (ref 32.0–48.0)
pH, Arterial: 7.089 — CL (ref 7.350–7.450)
pH, Arterial: 7.31 — ABNORMAL LOW (ref 7.350–7.450)
pO2, Arterial: 73.9 mmHg — ABNORMAL LOW (ref 83.0–108.0)
pO2, Arterial: 65.6 mmHg — ABNORMAL LOW (ref 83.0–108.0)

## 2020-05-15 LAB — THEOPHYLLINE LEVEL: Theophylline Lvl: 3.6 ug/mL — ABNORMAL LOW (ref 10.0–20.0)

## 2020-05-15 LAB — CBC
HCT: 30.5 % — ABNORMAL LOW (ref 36.0–46.0)
Hemoglobin: 8.9 g/dL — ABNORMAL LOW (ref 12.0–15.0)
MCH: 31.2 pg (ref 26.0–34.0)
MCHC: 29.2 g/dL — ABNORMAL LOW (ref 30.0–36.0)
MCV: 107 fL — ABNORMAL HIGH (ref 80.0–100.0)
Platelets: 160 K/uL (ref 150–400)
RBC: 2.85 MIL/uL — ABNORMAL LOW (ref 3.87–5.11)
RDW: 17 % — ABNORMAL HIGH (ref 11.5–15.5)
WBC: 13.7 K/uL — ABNORMAL HIGH (ref 4.0–10.5)
nRBC: 1 % — ABNORMAL HIGH (ref 0.0–0.2)

## 2020-05-15 NOTE — Progress Notes (Signed)
Pulmonary Critical Care Medicine [facility] [place]   PULMONARY CRITICAL CARE SERVICE  PROGRESS NOTE  Date of Service: 05/15/2020  Kathleen Cameron  [id number]  DOB: 21-Apr-1966   DOA: 04/10/2020  Referring Physician: Carron Curie, MD  HPI: Kathleen Cameron is a 55 y.o. female seen for follow up of Acute on Chronic Respiratory Failure.  Patient currently is on full support on pressure control mode appears to be comfortable right now without distress at this  Medications: Reviewed on Rounds  Physical Exam:  Vitals: Temperature 97.8 pulse 106 respiratory rate is 23 blood pressure is 139/99 saturations 99%  Ventilator Settings on pressure control FiO2 is 45% PEEP 7 IP 23  . General: Comfortable at this time . Eyes: Grossly normal lids, irises & conjunctiva . ENT: grossly tongue is normal . Neck: no obvious mass . Cardiovascular: S1 S2 normal no gallop . Respiratory: No rhonchi coarse breath . Abdomen: soft . Skin: no rash seen on limited exam . Musculoskeletal: not rigid . Psychiatric:unable to assess . Neurologic: no seizure no involuntary movements         Lab Data:   Basic Metabolic Panel: Recent Labs  Lab 05/10/20 0547 05/13/20 0428  NA 144 143  K 4.0 4.4  CL 98 100  CO2 36* 33*  GLUCOSE 170* 144*  BUN 39* 33*  CREATININE 0.41* 0.34*  CALCIUM 8.9 8.8*  MG 2.2  --     ABG: Recent Labs  Lab 05/15/20 0310 05/15/20 0607  PHART 7.089* 7.310*  PCO2ART >120.0* 80.4*  PO2ART 73.9* 65.6*  HCO3 41.2* 39.3*  O2SAT 87.4 91.6    Liver Function Tests: No results for input(s): AST, ALT, ALKPHOS, BILITOT, PROT, ALBUMIN in the last 168 hours. No results for input(s): LIPASE, AMYLASE in the last 168 hours. No results for input(s): AMMONIA in the last 168 hours.  CBC: Recent Labs  Lab 05/10/20 0547 05/10/20 2217 05/13/20 0428 05/15/20 0516  WBC 8.3  --  6.0 13.7*  HGB 6.8* 8.4* 7.7* 8.9*  HCT 22.0* 26.5* 24.9* 30.5*  MCV 103.3*  --   103.3* 107.0*  PLT 174  --  147* 160    Cardiac Enzymes: No results for input(s): CKTOTAL, CKMB, CKMBINDEX, TROPONINI in the last 168 hours.  BNP (last 3 results) Recent Labs    09/06/19 1414 01/28/20 1503 02/25/20 1115  BNP 32.2 106.8* 282.7*    ProBNP (last 3 results) No results for input(s): PROBNP in the last 8760 hours.  Radiological Exams: DG Chest 1 View  Result Date: 05/15/2020 CLINICAL DATA:  Respiratory failure EXAM: CHEST  1 VIEW COMPARISON:  05/12/2020 FINDINGS: Tracheostomy again noted. The lungs are moderately hyperinflated and pulmonary insufflation has progressed since prior examination. Superimposed airspace infiltrate throughout the left mid lung zone has progressed in the interval, likely infectious in the appropriate clinical setting. Stable granuloma within the right upper lung zone. No pneumothorax or pleural effusion. Cardiac size within normal limits. The pulmonary vascularity is normal. IMPRESSION: Progressive pulmonary infiltrate throughout the left mid lung zone, likely infectious in the appropriate clinical setting. Progressive pulmonary hyperinflation suggesting obstructive pulmonary disease. Electronically Signed   By: Helyn Numbers MD   On: 05/15/2020 03:49   DG Abd Portable 1V  Result Date: 05/14/2020 CLINICAL DATA:  Distended abdomen EXAM: PORTABLE ABDOMEN - 1 VIEW COMPARISON:  05/06/2020 FINDINGS: Gas in nondilated large and small bowel. Negative for bowel obstruction or ileus. Gastrostomy tube overlying the left upper quadrant unchanged. IMPRESSION: Negative. Electronically Signed   By:  Marlan Palau M.D.   On: 05/14/2020 13:03    Assessment/Plan Active Problems:   Acute on chronic respiratory failure with hypoxia (HCC)   Seizure disorder (HCC)   COPD, severe (HCC)   Anxiety disorder   1. Acute on chronic respiratory failure hypoxia we will continue with full support on the ventilator overnight she had increased PCO2 elevated I have asked for  respiratory therapy to stop weaning her at this point patient is not amenable needs to have discussion of goals of care with family 2. Seizure disorder no active seizure noted 3. Severe COPD end-stage terminal 4. Anxiety disorder no change   I have personally seen and evaluated the patient, evaluated laboratory and imaging results, formulated the assessment and plan and placed orders. The Patient requires high complexity decision making with multiple systems involvement.  Rounds were done with the Respiratory Therapy Director and Staff therapists and discussed with nursing staff also.  Yevonne Pax, MD [facility] Pulmonary Critical Care Medicine Sleep Medicine

## 2020-05-16 LAB — CBC
HCT: 23.4 % — ABNORMAL LOW (ref 36.0–46.0)
Hemoglobin: 7.3 g/dL — ABNORMAL LOW (ref 12.0–15.0)
MCH: 32.4 pg (ref 26.0–34.0)
MCHC: 31.2 g/dL (ref 30.0–36.0)
MCV: 104 fL — ABNORMAL HIGH (ref 80.0–100.0)
Platelets: 136 10*3/uL — ABNORMAL LOW (ref 150–400)
RBC: 2.25 MIL/uL — ABNORMAL LOW (ref 3.87–5.11)
RDW: 16.9 % — ABNORMAL HIGH (ref 11.5–15.5)
WBC: 7.6 10*3/uL (ref 4.0–10.5)
nRBC: 0.9 % — ABNORMAL HIGH (ref 0.0–0.2)

## 2020-05-17 LAB — CBC
HCT: 24.7 % — ABNORMAL LOW (ref 36.0–46.0)
Hemoglobin: 7.7 g/dL — ABNORMAL LOW (ref 12.0–15.0)
MCH: 32.4 pg (ref 26.0–34.0)
MCHC: 31.2 g/dL (ref 30.0–36.0)
MCV: 103.8 fL — ABNORMAL HIGH (ref 80.0–100.0)
Platelets: 146 10*3/uL — ABNORMAL LOW (ref 150–400)
RBC: 2.38 MIL/uL — ABNORMAL LOW (ref 3.87–5.11)
RDW: 16.6 % — ABNORMAL HIGH (ref 11.5–15.5)
WBC: 8.5 10*3/uL (ref 4.0–10.5)
nRBC: 0.7 % — ABNORMAL HIGH (ref 0.0–0.2)

## 2020-05-17 NOTE — Progress Notes (Signed)
Pulmonary Critical Care Medicine [facility] [place]   PULMONARY CRITICAL CARE SERVICE  PROGRESS NOTE  Date of Service: 05/17/2020  Kathleen Cameron  [id number]  DOB: 01/25/1966   DOA: 04/10/2020  Referring Physician: Carron Curie, MD  HPI: Kathleen Cameron is a 55 y.o. female seen for follow up of Acute on Chronic Respiratory Failure.  Patient is on pressure control right now full support she has been deemed is not able to wean off the ventilator.  Family is still needing to have a goals of care meeting  Medications: Reviewed on Rounds  Physical Exam:  Vitals: Temperature is 96.4 pulse 86 respiratory rate 25 blood pressure is 149/87 saturations 100%  Ventilator Settings patient currently on pressure control FiO2 is 45% PEEP 7 IP 23 tidal volume is 400  . General: Comfortable at this time . Eyes: Grossly normal lids, irises & conjunctiva . ENT: grossly tongue is normal . Neck: no obvious mass . Cardiovascular: S1 S2 normal no gallop . Respiratory: Coarse rhonchi expansion is equal at this time . Abdomen: soft . Skin: no rash seen on limited exam . Musculoskeletal: not rigid . Psychiatric:unable to assess . Neurologic: no seizure no involuntary movements         Lab Data:   Basic Metabolic Panel: Recent Labs  Lab 05/13/20 0428  NA 143  K 4.4  CL 100  CO2 33*  GLUCOSE 144*  BUN 33*  CREATININE 0.34*  CALCIUM 8.8*    ABG: Recent Labs  Lab 05/15/20 0310 05/15/20 0607  PHART 7.089* 7.310*  PCO2ART >120.0* 80.4*  PO2ART 73.9* 65.6*  HCO3 41.2* 39.3*  O2SAT 87.4 91.6    Liver Function Tests: No results for input(s): AST, ALT, ALKPHOS, BILITOT, PROT, ALBUMIN in the last 168 hours. No results for input(s): LIPASE, AMYLASE in the last 168 hours. No results for input(s): AMMONIA in the last 168 hours.  CBC: Recent Labs  Lab 05/10/20 2217 05/13/20 0428 05/15/20 0516 05/16/20 0419 05/17/20 0422  WBC  --  6.0 13.7* 7.6 8.5  HGB 8.4*  7.7* 8.9* 7.3* 7.7*  HCT 26.5* 24.9* 30.5* 23.4* 24.7*  MCV  --  103.3* 107.0* 104.0* 103.8*  PLT  --  147* 160 136* 146*    Cardiac Enzymes: No results for input(s): CKTOTAL, CKMB, CKMBINDEX, TROPONINI in the last 168 hours.  BNP (last 3 results) Recent Labs    09/06/19 1414 01/28/20 1503 02/25/20 1115  BNP 32.2 106.8* 282.7*    ProBNP (last 3 results) No results for input(s): PROBNP in the last 8760 hours.  Radiological Exams: No results found.  Assessment/Plan Active Problems:   Acute on chronic respiratory failure with hypoxia (HCC)   Seizure disorder (HCC)   COPD, severe (HCC)   Anxiety disorder   1. Acute on chronic respiratory failure hypoxia we will continue with full support on the ventilator patient has very severe end-stage terminal COPD not able to wean 2. Severe COPD as above advanced severe end-stage disease 3. Seizure disorder no active seizures admit observe 4. Anxiety remains under control continue to monitor   I have personally seen and evaluated the patient, evaluated laboratory and imaging results, formulated the assessment and plan and placed orders. The Patient requires high complexity decision making with multiple systems involvement.  Rounds were done with the Respiratory Therapy Director and Staff therapists and discussed with nursing staff also.  Yevonne Pax, MD [facility] Pulmonary Critical Care Medicine Sleep Medicine

## 2020-05-18 NOTE — Progress Notes (Signed)
Pulmonary Critical Care Medicine [facility] [place]   PULMONARY CRITICAL CARE SERVICE  PROGRESS NOTE  Date of Service: 05/18/2020  Kathleen Cameron  [id number]  DOB: 13-Oct-1965   DOA: 04/10/2020  Referring Physician: Carron Curie, MD  HPI: Kathleen Cameron is a 55 y.o. female seen for follow up of Acute on Chronic Respiratory Failure.  She is at baseline on pressure control not able to tolerate any weaning due to end-stage COPD  Medications: Reviewed on Rounds  Physical Exam:  Vitals: Temperature is 96.7 pulse 90 respiratory rate 25 blood pressure is 131/88 saturations 97%  Ventilator Settings on pressure control FiO2 of 50% IP 30 PEEP 5  . General: Comfortable at this time . Eyes: Grossly normal lids, irises & conjunctiva . ENT: grossly tongue is normal . Neck: no obvious mass . Cardiovascular: S1 S2 normal no gallop . Respiratory: Scattered rhonchi coarse breath sounds . Abdomen: soft . Skin: no rash seen on limited exam . Musculoskeletal: not rigid . Psychiatric:unable to assess . Neurologic: no seizure no involuntary movements         Lab Data:   Basic Metabolic Panel: Recent Labs  Lab 05/13/20 0428  NA 143  K 4.4  CL 100  CO2 33*  GLUCOSE 144*  BUN 33*  CREATININE 0.34*  CALCIUM 8.8*    ABG: Recent Labs  Lab 05/15/20 0310 05/15/20 0607  PHART 7.089* 7.310*  PCO2ART >120.0* 80.4*  PO2ART 73.9* 65.6*  HCO3 41.2* 39.3*  O2SAT 87.4 91.6    Liver Function Tests: No results for input(s): AST, ALT, ALKPHOS, BILITOT, PROT, ALBUMIN in the last 168 hours. No results for input(s): LIPASE, AMYLASE in the last 168 hours. No results for input(s): AMMONIA in the last 168 hours.  CBC: Recent Labs  Lab 05/13/20 0428 05/15/20 0516 05/16/20 0419 05/17/20 0422  WBC 6.0 13.7* 7.6 8.5  HGB 7.7* 8.9* 7.3* 7.7*  HCT 24.9* 30.5* 23.4* 24.7*  MCV 103.3* 107.0* 104.0* 103.8*  PLT 147* 160 136* 146*    Cardiac Enzymes: No results for  input(s): CKTOTAL, CKMB, CKMBINDEX, TROPONINI in the last 168 hours.  BNP (last 3 results) Recent Labs    09/06/19 1414 01/28/20 1503 02/25/20 1115  BNP 32.2 106.8* 282.7*    ProBNP (last 3 results) No results for input(s): PROBNP in the last 8760 hours.  Radiological Exams: No results found.  Assessment/Plan Active Problems:   Acute on chronic respiratory failure with hypoxia (HCC)   Seizure disorder (HCC)   COPD, severe (HCC)   Anxiety disorder   1. Acute on chronic respiratory failure with hypoxia she is not amenable we will continue with full support on the current settings. 2. Seizure disorder no change we will continue to monitor closely. 3. Severe COPD patient is at baseline we will continue present management 4. Anxiety disorder at baseline we will continue to follow   I have personally seen and evaluated the patient, evaluated laboratory and imaging results, formulated the assessment and plan and placed orders. The Patient requires high complexity decision making with multiple systems involvement.  Rounds were done with the Respiratory Therapy Director and Staff therapists and discussed with nursing staff also.  Yevonne Pax, MD [facility] Pulmonary Critical Care Medicine Sleep Medicine

## 2020-05-19 LAB — BLOOD GAS, ARTERIAL
Acid-Base Excess: 13.2 mmol/L — ABNORMAL HIGH (ref 0.0–2.0)
Acid-Base Excess: 14.3 mmol/L — ABNORMAL HIGH (ref 0.0–2.0)
Acid-Base Excess: 11.8 mmol/L — ABNORMAL HIGH (ref 0.0–2.0)
Bicarbonate: 40.9 mmol/L — ABNORMAL HIGH (ref 20.0–28.0)
Bicarbonate: 41.9 mmol/L — ABNORMAL HIGH (ref 20.0–28.0)
Bicarbonate: 35.9 mmol/L — ABNORMAL HIGH (ref 20.0–28.0)
FIO2: 55
FIO2: 50
FIO2: 55
O2 Saturation: 98.5 %
O2 Saturation: 93.4 %
O2 Saturation: 100 %
Patient temperature: 37.3
Patient temperature: 37.3
Patient temperature: 37.2
pCO2 arterial: 102 mmHg (ref 32.0–48.0)
pCO2 arterial: 102 mmHg (ref 32.0–48.0)
pCO2 arterial: 47.1 mmHg (ref 32.0–48.0)
pH, Arterial: 7.228 — ABNORMAL LOW (ref 7.350–7.450)
pH, Arterial: 7.238 — ABNORMAL LOW (ref 7.350–7.450)
pH, Arterial: 7.495 — ABNORMAL HIGH (ref 7.350–7.450)
pO2, Arterial: 125 mmHg — ABNORMAL HIGH (ref 83.0–108.0)
pO2, Arterial: 73.7 mmHg — ABNORMAL LOW (ref 83.0–108.0)
pO2, Arterial: 155 mmHg — ABNORMAL HIGH (ref 83.0–108.0)

## 2020-05-19 NOTE — Progress Notes (Signed)
Pulmonary Critical Care Medicine [facility] [place]   PULMONARY CRITICAL CARE SERVICE  PROGRESS NOTE  Date of Service: 05/19/2020  Kathleen Cameron  VOZ:[id number]  DOB: 05-10-1965   DOA: 04/10/2020  Referring Physician: Carron Curie, MD  HPI: Kathleen Cameron is a 55 y.o. female seen for follow up of Acute on Chronic Respiratory Failure.  Patient currently is on full support has been on pressure control mode 50% FiO2.  Medications: Reviewed on Rounds  Physical Exam:  Vitals: Temperature is 97.7 pulse 96 respiratory 24 blood pressure is 131/88 saturations 97%  Ventilator Settings on pressure assist control FiO2 50% IP 30 PEEP of 5  . General: Comfortable at this time . Eyes: Grossly normal lids, irises & conjunctiva . ENT: grossly tongue is normal . Neck: no obvious mass . Cardiovascular: S1 S2 normal no gallop . Respiratory: Scattered rhonchi expansion is equal at this time . Abdomen: soft . Skin: no rash seen on limited exam . Musculoskeletal: not rigid . Psychiatric:unable to assess . Neurologic: no seizure no involuntary movements         Lab Data:   Basic Metabolic Panel: Recent Labs  Lab 05/13/20 0428  NA 143  K 4.4  CL 100  CO2 33*  GLUCOSE 144*  BUN 33*  CREATININE 0.34*  CALCIUM 8.8*    ABG: Recent Labs  Lab 05/15/20 0310 05/15/20 0607 05/19/20 0732 05/19/20 0855 05/19/20 1055  PHART 7.089* 7.310* 7.228* 7.238* 7.495*  PCO2ART >120.0* 80.4* 102* 102* 47.1  PO2ART 73.9* 65.6* 125* 73.7* 155*  HCO3 41.2* 39.3* 40.9* 41.9* 35.9*  O2SAT 87.4 91.6 98.5 93.4 100.0    Liver Function Tests: No results for input(s): AST, ALT, ALKPHOS, BILITOT, PROT, ALBUMIN in the last 168 hours. No results for input(s): LIPASE, AMYLASE in the last 168 hours. No results for input(s): AMMONIA in the last 168 hours.  CBC: Recent Labs  Lab 05/13/20 0428 05/15/20 0516 05/16/20 0419 05/17/20 0422  WBC 6.0 13.7* 7.6 8.5  HGB 7.7* 8.9* 7.3* 7.7*   HCT 24.9* 30.5* 23.4* 24.7*  MCV 103.3* 107.0* 104.0* 103.8*  PLT 147* 160 136* 146*    Cardiac Enzymes: No results for input(s): CKTOTAL, CKMB, CKMBINDEX, TROPONINI in the last 168 hours.  BNP (last 3 results) Recent Labs    09/06/19 1414 01/28/20 1503 02/25/20 1115  BNP 32.2 106.8* 282.7*    ProBNP (last 3 results) No results for input(s): PROBNP in the last 8760 hours.  Radiological Exams: No results found.  Assessment/Plan Active Problems:   Acute on chronic respiratory failure with hypoxia (HCC)   Seizure disorder (HCC)   COPD, severe (HCC)   Anxiety disorder   1. Acute on chronic respiratory failure hypoxia plan is to continue with full support on the ventilator she is not able to tolerate weaning attempts. 2. Seizure disorder no active seizures 3. Severe COPD continue with medical management 4. Anxiety disorder no change we will continue to follow along   I have personally seen and evaluated the patient, evaluated laboratory and imaging results, formulated the assessment and plan and placed orders. The Patient requires high complexity decision making with multiple systems involvement.  Rounds were done with the Respiratory Therapy Director and Staff therapists and discussed with nursing staff also.  Yevonne Pax, MD [facility] Pulmonary Critical Care Medicine Sleep Medicine

## 2020-05-20 ENCOUNTER — Other Ambulatory Visit (HOSPITAL_COMMUNITY): Payer: Self-pay

## 2020-05-20 LAB — BASIC METABOLIC PANEL
Anion gap: 10 (ref 5–15)
BUN: 40 mg/dL — ABNORMAL HIGH (ref 6–20)
CO2: 35 mmol/L — ABNORMAL HIGH (ref 22–32)
Calcium: 8.5 mg/dL — ABNORMAL LOW (ref 8.9–10.3)
Chloride: 95 mmol/L — ABNORMAL LOW (ref 98–111)
Creatinine, Ser: 0.49 mg/dL (ref 0.44–1.00)
GFR, Estimated: >60 mL/min (ref >60)
Glucose, Bld: 122 mg/dL — ABNORMAL HIGH (ref 70–99)
Potassium: 3.9 mmol/L (ref 3.5–5.1)
Sodium: 140 mmol/L (ref 135–145)

## 2020-05-20 LAB — CBC
HCT: 22.9 % — ABNORMAL LOW (ref 36.0–46.0)
Hemoglobin: 6.7 g/dL — CL (ref 12.0–15.0)
MCH: 30.7 pg (ref 26.0–34.0)
MCHC: 29.3 g/dL — ABNORMAL LOW (ref 30.0–36.0)
MCV: 105 fL — ABNORMAL HIGH (ref 80.0–100.0)
Platelets: 123 10*3/uL — ABNORMAL LOW (ref 150–400)
RBC: 2.18 MIL/uL — ABNORMAL LOW (ref 3.87–5.11)
RDW: 17 % — ABNORMAL HIGH (ref 11.5–15.5)
WBC: 7.2 10*3/uL (ref 4.0–10.5)
nRBC: 1.3 % — ABNORMAL HIGH (ref 0.0–0.2)

## 2020-05-20 LAB — PREPARE RBC (CROSSMATCH)

## 2020-05-20 NOTE — Progress Notes (Signed)
Pulmonary Critical Care Medicine [name] [place]   PULMONARY CRITICAL CARE SERVICE  PROGRESS NOTE  Date of Service: 05/20/2020  Kathleen Cameron  EGB:[id number]  DOB: 1965/08/11   DOA: 04/10/2020  Referring Physician: Carron Curie, MD  HPI: Kathleen Cameron is a 55 y.o. female seen for follow up of Acute on Chronic Respiratory Failure.  Patient at this time is on full support on assist control mode requiring 45% FiO2.  She is deemed as not being able to wean  Medications: Reviewed on Rounds  Physical Exam:  Vitals: Temperature is 98.9 pulse 70 respiratory 23 blood pressure is 106/80 saturations 100%  Ventilator Settings on assist control FiO2 is 45% tidal volume 450 PEEP 5  . General: Comfortable at this time . Eyes: Grossly normal lids, irises & conjunctiva . ENT: grossly tongue is normal . Neck: no obvious mass . Cardiovascular: S1 S2 normal no gallop . Respiratory: Scattered rhonchi expansion is equal . Abdomen: soft . Skin: no rash seen on limited exam . Musculoskeletal: not rigid . Psychiatric:unable to assess . Neurologic: no seizure no involuntary movements         Lab Data:   Basic Metabolic Panel: Recent Labs  Lab 05/20/20 0439  NA 140  K 3.9  CL 95*  CO2 35*  GLUCOSE 122*  BUN 40*  CREATININE 0.49  CALCIUM 8.5*    ABG: Recent Labs  Lab 05/15/20 0310 05/15/20 0607 05/19/20 0732 05/19/20 0855 05/19/20 1055  PHART 7.089* 7.310* 7.228* 7.238* 7.495*  PCO2ART >120.0* 80.4* 102* 102* 47.1  PO2ART 73.9* 65.6* 125* 73.7* 155*  HCO3 41.2* 39.3* 40.9* 41.9* 35.9*  O2SAT 87.4 91.6 98.5 93.4 100.0    Liver Function Tests: No results for input(s): AST, ALT, ALKPHOS, BILITOT, PROT, ALBUMIN in the last 168 hours. No results for input(s): LIPASE, AMYLASE in the last 168 hours. No results for input(s): AMMONIA in the last 168 hours.  CBC: Recent Labs  Lab 05/15/20 0516 05/16/20 0419 05/17/20 0422 05/20/20 0439  WBC 13.7* 7.6  8.5 7.2  HGB 8.9* 7.3* 7.7* 6.7*  HCT 30.5* 23.4* 24.7* 22.9*  MCV 107.0* 104.0* 103.8* 105.0*  PLT 160 136* 146* 123*    Cardiac Enzymes: No results for input(s): CKTOTAL, CKMB, CKMBINDEX, TROPONINI in the last 168 hours.  BNP (last 3 results) Recent Labs    09/06/19 1414 01/28/20 1503 02/25/20 1115  BNP 32.2 106.8* 282.7*    ProBNP (last 3 results) No results for input(s): PROBNP in the last 8760 hours.  Radiological Exams: DG CHEST PORT 1 VIEW  Result Date: 05/20/2020 CLINICAL DATA:  Acute on chronic respiratory failure EXAM: PORTABLE CHEST 1 VIEW COMPARISON:  Five days ago FINDINGS: Tracheostomy tube in place. Hyperinflated lung with left airspace disease. Calcified pulmonary nodules and mediastinal lymph nodes. Normal heart size. No visible air leak. Blunting at the lateral costophrenic sulci is stable and likely from hyperinflation rather than trace effusion. IMPRESSION: COPD and left-sided pneumonia.  No interval change. Electronically Signed   By: Marnee Spring M.D.   On: 05/20/2020 06:48    Assessment/Plan Active Problems:   Acute on chronic respiratory failure with hypoxia (HCC)   Seizure disorder (HCC)   COPD, severe (HCC)   Anxiety disorder   1. Acute on chronic respiratory failure with hypoxia we will continue with full support on the ventilator she is at her baseline not able to wean 2. Seizure disorder no active seizures are noted. 3. Severe COPD medical management 4. Anxiety under better control  I have personally seen and evaluated the patient, evaluated laboratory and imaging results, formulated the assessment and plan and placed orders. The Patient requires high complexity decision making with multiple systems involvement.  Rounds were done with the Respiratory Therapy Director and Staff therapists and discussed with nursing staff also.  Allyne Gee, MD [facility] Pulmonary Critical Care Medicine Sleep Medicine

## 2020-05-21 LAB — BPAM RBC
Blood Product Expiration Date: 202202092359
ISSUE DATE / TIME: 202201300744
Unit Type and Rh: 5100

## 2020-05-21 LAB — TYPE AND SCREEN
ABO/RH(D): O POS
Antibody Screen: NEGATIVE
Unit division: 0

## 2020-05-21 LAB — HEMOGLOBIN AND HEMATOCRIT, BLOOD
HCT: 28 % — ABNORMAL LOW (ref 36.0–46.0)
Hemoglobin: 8.7 g/dL — ABNORMAL LOW (ref 12.0–15.0)

## 2020-05-21 NOTE — Progress Notes (Signed)
Pulmonary Critical Care Medicine [facility] [place]   PULMONARY CRITICAL CARE SERVICE  PROGRESS NOTE  Date of Service: 05/21/2020  Kathleen Cameron  EUM:[id number]  DOB: [birth year]   DOA: 04/10/2020  Referring Physician: Carron Curie, MD  HPI: Kathleen Cameron is a 55 y.o. female seen for follow up of Acute on Chronic Respiratory Failure.  Apparently the family has now decided to take the patient home since she is not able to wean off the ventilator  Medications: Reviewed on Rounds  Physical Exam:  Vitals: Temperature is 98.0 pulse 58 respiratory rate is 21 blood pressure 115/67 saturations 100%  Ventilator Settings on assist control FiO2 45% tidal volume 480 PEEP of 6  . General: Comfortable at this time . Eyes: Grossly normal lids, irises & conjunctiva . ENT: grossly tongue is normal . Neck: no obvious mass . Cardiovascular: S1 S2 normal no gallop . Respiratory: Scattered rhonchi coarse breath sounds . Abdomen: soft . Skin: no rash seen on limited exam . Musculoskeletal: not rigid . Psychiatric:unable to assess . Neurologic: no seizure no involuntary movements         Lab Data:   Basic Metabolic Panel: Recent Labs  Lab 05/20/20 0439  NA 140  K 3.9  CL 95*  CO2 35*  GLUCOSE 122*  BUN 40*  CREATININE 0.49  CALCIUM 8.5*    ABG: Recent Labs  Lab 05/15/20 0310 05/15/20 0607 05/19/20 0732 05/19/20 0855 05/19/20 1055  PHART 7.089* 7.310* 7.228* 7.238* 7.495*  PCO2ART >120.0* 80.4* 102* 102* 47.1  PO2ART 73.9* 65.6* 125* 73.7* 155*  HCO3 41.2* 39.3* 40.9* 41.9* 35.9*  O2SAT 87.4 91.6 98.5 93.4 100.0    Liver Function Tests: No results for input(s): AST, ALT, ALKPHOS, BILITOT, PROT, ALBUMIN in the last 168 hours. No results for input(s): LIPASE, AMYLASE in the last 168 hours. No results for input(s): AMMONIA in the last 168 hours.  CBC: Recent Labs  Lab 05/15/20 0516 05/16/20 0419 05/17/20 0422 05/20/20 0439  WBC 13.7* 7.6 8.5  7.2  HGB 8.9* 7.3* 7.7* 6.7*  HCT 30.5* 23.4* 24.7* 22.9*  MCV 107.0* 104.0* 103.8* 105.0*  PLT 160 136* 146* 123*    Cardiac Enzymes: No results for input(s): CKTOTAL, CKMB, CKMBINDEX, TROPONINI in the last 168 hours.  BNP (last 3 results) Recent Labs    09/06/19 1414 01/28/20 1503 02/25/20 1115  BNP 32.2 106.8* 282.7*    ProBNP (last 3 results) No results for input(s): PROBNP in the last 8760 hours.  Radiological Exams: DG CHEST PORT 1 VIEW  Result Date: 05/20/2020 CLINICAL DATA:  Acute on chronic respiratory failure EXAM: PORTABLE CHEST 1 VIEW COMPARISON:  Five days ago FINDINGS: Tracheostomy tube in place. Hyperinflated lung with left airspace disease. Calcified pulmonary nodules and mediastinal lymph nodes. Normal heart size. No visible air leak. Blunting at the lateral costophrenic sulci is stable and likely from hyperinflation rather than trace effusion. IMPRESSION: COPD and left-sided pneumonia.  No interval change. Electronically Signed   By: Marnee Spring M.D.   On: 05/20/2020 06:48    Assessment/Plan Active Problems:   Acute on chronic respiratory failure with hypoxia (HCC)   Seizure disorder (HCC)   COPD, severe (HCC)   Anxiety disorder   1. Acute on chronic respiratory failure hypoxia we will continue with full support on the ventilator she is not weaning. 2. Seizure disorder no active seizures 3. Severe COPD at baseline 4. Anxiety disorder no change   I have personally seen and evaluated the patient, evaluated laboratory  and imaging results, formulated the assessment and plan and placed orders. The Patient requires high complexity decision making with multiple systems involvement.  Rounds were done with the Respiratory Therapy Director and Staff therapists and discussed with nursing staff also.  Allyne Gee, MD [facility] Pulmonary Critical Care Medicine Sleep Medicine

## 2020-05-22 NOTE — Progress Notes (Signed)
Pulmonary Critical Care Medicine [facility] [place]   PULMONARY CRITICAL CARE SERVICE  PROGRESS NOTE  Date of Service: 05/22/2020  Kathleen Cameron  DJM:[id number]  DOB: [birth year]   DOA: 04/10/2020  Referring Physician: Carron Curie, MD  HPI: Kathleen Cameron is a 55 y.o. female seen for follow up of Acute on Chronic Respiratory Failure.  Patient currently is on assist control mode on full support controlled right now without distress  Medications: Reviewed on Rounds  Physical Exam:  Vitals: Temperature is 97.8 pulse 64 respiratory 22 blood pressure is 113/69 saturations 99%  Ventilator Settings on assist control FiO2 40%   General: Comfortable at this time  Eyes: Grossly normal lids, irises & conjunctiva  ENT: grossly tongue is normal  Neck: no obvious mass  Cardiovascular: S1 S2 normal no gallop  Respiratory: Coarse rhonchi expansion is equal  Abdomen: soft  Skin: no rash seen on limited exam  Musculoskeletal: not rigid  Psychiatric:unable to assess  Neurologic: no seizure no involuntary movements         Lab Data:   Basic Metabolic Panel: Recent Labs  Lab 05/20/20 0439  NA 140  K 3.9  CL 95*  CO2 35*  GLUCOSE 122*  BUN 40*  CREATININE 0.49  CALCIUM 8.5*    ABG: Recent Labs  Lab 05/19/20 0732 05/19/20 0855 05/19/20 1055  PHART 7.228* 7.238* 7.495*  PCO2ART 102* 102* 47.1  PO2ART 125* 73.7* 155*  HCO3 40.9* 41.9* 35.9*  O2SAT 98.5 93.4 100.0    Liver Function Tests: No results for input(s): AST, ALT, ALKPHOS, BILITOT, PROT, ALBUMIN in the last 168 hours. No results for input(s): LIPASE, AMYLASE in the last 168 hours. No results for input(s): AMMONIA in the last 168 hours.  CBC: Recent Labs  Lab 05/16/20 0419 05/17/20 0422 05/20/20 0439 05/21/20 1524  WBC 7.6 8.5 7.2  --   HGB 7.3* 7.7* 6.7* 8.7*  HCT 23.4* 24.7* 22.9* 28.0*  MCV 104.0* 103.8* 105.0*  --   PLT 136* 146* 123*  --     Cardiac Enzymes: No  results for input(s): CKTOTAL, CKMB, CKMBINDEX, TROPONINI in the last 168 hours.  BNP (last 3 results) Recent Labs    09/06/19 1414 01/28/20 1503 02/25/20 1115  BNP 32.2 106.8* 282.7*    ProBNP (last 3 results) No results for input(s): PROBNP in the last 8760 hours.  Radiological Exams: No results found.  Assessment/Plan Active Problems:   Acute on chronic respiratory failure with hypoxia (HCC)   Seizure disorder (HCC)   COPD, severe (HCC)   Anxiety disorder   1. Acute on chronic respiratory failure hypoxia we will continue with the full support on the ventilator.  She is not amenable. 2. Seizure disorder there is no active seizures noted at this time 3. Severe COPD at baseline 4. Anxiety note change we will continue with supportive care   I have personally seen and evaluated the patient, evaluated laboratory and imaging results, formulated the assessment and plan and placed orders. The Patient requires high complexity decision making with multiple systems involvement.  Rounds were done with the Respiratory Therapy Director and Staff therapists and discussed with nursing staff also.  Yevonne Pax, MD [facility] Pulmonary Critical Care Medicine Sleep Medicine

## 2020-05-23 NOTE — Progress Notes (Signed)
Pulmonary Critical Care Medicine [facility] [place]   PULMONARY CRITICAL CARE SERVICE  PROGRESS NOTE  Date of Service: 05/23/2020  Kathleen Cameron  PPI:[id number]  DOB: Sep 01, 1965   DOA: 04/10/2020  Referring Physician: Carron Curie, MD  HPI: Kathleen Cameron is a 55 y.o. female seen for follow up of Acute on Chronic Respiratory Failure.  Patient at this time is on assist control currently on 40% FiO2  Medications: Reviewed on Rounds  Physical Exam:  Vitals: Temperature is 97.6 pulse 67 respiratory 21 blood pressure is 129/80 saturations 100%  Ventilator Settings on assist control FiO2 is 40% PEEP of 6 tidal volume 480  . General: Comfortable at this time . Eyes: Grossly normal lids, irises & conjunctiva . ENT: grossly tongue is normal . Neck: no obvious mass . Cardiovascular: S1 S2 normal no gallop . Respiratory: No rhonchi very coarse percent . Abdomen: soft . Skin: no rash seen on limited exam . Musculoskeletal: not rigid . Psychiatric:unable to assess . Neurologic: no seizure no involuntary movements         Lab Data:   Basic Metabolic Panel: Recent Labs  Lab 05/20/20 0439  NA 140  K 3.9  CL 95*  CO2 35*  GLUCOSE 122*  BUN 40*  CREATININE 0.49  CALCIUM 8.5*    ABG: Recent Labs  Lab 05/19/20 0732 05/19/20 0855 05/19/20 1055  PHART 7.228* 7.238* 7.495*  PCO2ART 102* 102* 47.1  PO2ART 125* 73.7* 155*  HCO3 40.9* 41.9* 35.9*  O2SAT 98.5 93.4 100.0    Liver Function Tests: No results for input(s): AST, ALT, ALKPHOS, BILITOT, PROT, ALBUMIN in the last 168 hours. No results for input(s): LIPASE, AMYLASE in the last 168 hours. No results for input(s): AMMONIA in the last 168 hours.  CBC: Recent Labs  Lab 05/17/20 0422 05/20/20 0439 05/21/20 1524  WBC 8.5 7.2  --   HGB 7.7* 6.7* 8.7*  HCT 24.7* 22.9* 28.0*  MCV 103.8* 105.0*  --   PLT 146* 123*  --     Cardiac Enzymes: No results for input(s): CKTOTAL, CKMB, CKMBINDEX,  TROPONINI in the last 168 hours.  BNP (last 3 results) Recent Labs    09/06/19 1414 01/28/20 1503 02/25/20 1115  BNP 32.2 106.8* 282.7*    ProBNP (last 3 results) No results for input(s): PROBNP in the last 8760 hours.  Radiological Exams: No results found.  Assessment/Plan Active Problems:   Acute on chronic respiratory failure with hypoxia (HCC)   Seizure disorder (HCC)   COPD, severe (HCC)   Anxiety disorder   1. Acute on chronic respiratory failure hypoxia we will continue with on full support on assist control mode 2. Seizure disorder no active seizures noted 3. Severe COPD we will continue to monitor 4. Anxiety disorder no change   I have personally seen and evaluated the patient, evaluated laboratory and imaging results, formulated the assessment and plan and placed orders. The Patient requires high complexity decision making with multiple systems involvement.  Rounds were done with the Respiratory Therapy Director and Staff therapists and discussed with nursing staff also.  Yevonne Pax, MD [facility] Pulmonary Critical Care Medicine Sleep Medicine

## 2020-05-24 LAB — CBC
HCT: 29.5 % — ABNORMAL LOW (ref 36.0–46.0)
Hemoglobin: 9.1 g/dL — ABNORMAL LOW (ref 12.0–15.0)
MCH: 29.3 pg (ref 26.0–34.0)
MCHC: 30.8 g/dL (ref 30.0–36.0)
MCV: 94.9 fL (ref 80.0–100.0)
Platelets: 141 10*3/uL — ABNORMAL LOW (ref 150–400)
RBC: 3.11 MIL/uL — ABNORMAL LOW (ref 3.87–5.11)
RDW: 20.8 % — ABNORMAL HIGH (ref 11.5–15.5)
WBC: 6.3 10*3/uL (ref 4.0–10.5)
nRBC: 0.3 % — ABNORMAL HIGH (ref 0.0–0.2)

## 2020-05-24 LAB — BASIC METABOLIC PANEL
Anion gap: 10 (ref 5–15)
BUN: 30 mg/dL — ABNORMAL HIGH (ref 6–20)
CO2: 31 mmol/L (ref 22–32)
Calcium: 9.1 mg/dL (ref 8.9–10.3)
Chloride: 99 mmol/L (ref 98–111)
Creatinine, Ser: 0.45 mg/dL (ref 0.44–1.00)
GFR, Estimated: >60 mL/min (ref >60)
Glucose, Bld: 108 mg/dL — ABNORMAL HIGH (ref 70–99)
Potassium: 3.7 mmol/L (ref 3.5–5.1)
Sodium: 140 mmol/L (ref 135–145)

## 2020-05-24 NOTE — Progress Notes (Signed)
Pulmonary Critical Care Medicine [facility] [place]   PULMONARY CRITICAL CARE SERVICE  PROGRESS NOTE  Date of Service: 05/24/2020  Kathleen Cameron  NLZ:[id number]  DOB: 1965/09/29   DOA: 04/10/2020  Referring Physician: Carron Curie, MD  HPI: Kathleen Cameron is a 55 y.o. female seen for follow up of Acute on Chronic Respiratory Failure.  Patient currently is on assist control has been on 40% FiO2 with a PEEP of 6  Medications: Reviewed on Rounds  Physical Exam:  Vitals: Temperature is 97.0 pulse 78 respiratory rate is 23 blood pressure is 143/98 saturations 98%  Ventilator Settings on assist control FiO2 40% tidal volume 480 PEEP of 6  . General: Comfortable at this time . Eyes: Grossly normal lids, irises & conjunctiva . ENT: grossly tongue is normal . Neck: no obvious mass . Cardiovascular: S1 S2 normal no gallop . Respiratory: Scattered rhonchi expansion seen . Abdomen: soft . Skin: no rash seen on limited exam . Musculoskeletal: not rigid . Psychiatric:unable to assess . Neurologic: no seizure no involuntary movements         Lab Data:   Basic Metabolic Panel: Recent Labs  Lab 05/20/20 0439 05/24/20 0339  NA 140 140  K 3.9 3.7  CL 95* 99  CO2 35* 31  GLUCOSE 122* 108*  BUN 40* 30*  CREATININE 0.49 0.45  CALCIUM 8.5* 9.1    ABG: Recent Labs  Lab 05/19/20 0732 05/19/20 0855 05/19/20 1055  PHART 7.228* 7.238* 7.495*  PCO2ART 102* 102* 47.1  PO2ART 125* 73.7* 155*  HCO3 40.9* 41.9* 35.9*  O2SAT 98.5 93.4 100.0    Liver Function Tests: No results for input(s): AST, ALT, ALKPHOS, BILITOT, PROT, ALBUMIN in the last 168 hours. No results for input(s): LIPASE, AMYLASE in the last 168 hours. No results for input(s): AMMONIA in the last 168 hours.  CBC: Recent Labs  Lab 05/20/20 0439 05/21/20 1524 05/24/20 0339  WBC 7.2  --  6.3  HGB 6.7* 8.7* 9.1*  HCT 22.9* 28.0* 29.5*  MCV 105.0*  --  94.9  PLT 123*  --  141*    Cardiac  Enzymes: No results for input(s): CKTOTAL, CKMB, CKMBINDEX, TROPONINI in the last 168 hours.  BNP (last 3 results) Recent Labs    09/06/19 1414 01/28/20 1503 02/25/20 1115  BNP 32.2 106.8* 282.7*    ProBNP (last 3 results) No results for input(s): PROBNP in the last 8760 hours.  Radiological Exams: No results found.  Assessment/Plan Active Problems:   Acute on chronic respiratory failure with hypoxia (HCC)   Seizure disorder (HCC)   COPD, severe (HCC)   Anxiety disorder   1. Acute on chronic respiratory failure hypoxia we will continue with full support patient not amenable 2. Seizure disorder no active seizures 3. Severe COPD at baseline 4. Anxiety controlled   I have personally seen and evaluated the patient, evaluated laboratory and imaging results, formulated the assessment and plan and placed orders. The Patient requires high complexity decision making with multiple systems involvement.  Rounds were done with the Respiratory Therapy Director and Staff therapists and discussed with nursing staff also.  Yevonne Pax, MD [facility] Pulmonary Critical Care Medicine Sleep Medicine

## 2020-05-25 NOTE — Progress Notes (Signed)
Pulmonary Critical Care Medicine [facility] [place]   PULMONARY CRITICAL CARE SERVICE  PROGRESS NOTE  Date of Service: 05/25/2020  Kathleen Cameron  VOJ:[id number]  DOB: 05/21/1965   DOA: 04/10/2020  Referring Physician: Carron Curie, MD  HPI: Kathleen Cameron is a 55 y.o. female seen for follow up of Acute on Chronic Respiratory Failure.  Patient is on assist control currently on 40% FiO2 full support not amenable to weaning  Medications: Reviewed on Rounds  Physical Exam:  Vitals: Temperature is 96.9 pulse 63 respiratory rate is 22 blood pressure is 142/96 saturations 100%  Ventilator Settings currently on assist control FiO2 40% tidal volume 480 PEEP 5  . General: Comfortable at this time . Eyes: Grossly normal lids, irises & conjunctiva . ENT: grossly tongue is normal . Neck: no obvious mass . Cardiovascular: S1 S2 normal no gallop . Respiratory: Scattered rhonchi expansion is equal . Abdomen: soft . Skin: no rash seen on limited exam . Musculoskeletal: not rigid . Psychiatric:unable to assess . Neurologic: no seizure no involuntary movements         Lab Data:   Basic Metabolic Panel: Recent Labs  Lab 05/20/20 0439 05/24/20 0339  NA 140 140  K 3.9 3.7  CL 95* 99  CO2 35* 31  GLUCOSE 122* 108*  BUN 40* 30*  CREATININE 0.49 0.45  CALCIUM 8.5* 9.1    ABG: Recent Labs  Lab 05/19/20 0732 05/19/20 0855 05/19/20 1055  PHART 7.228* 7.238* 7.495*  PCO2ART 102* 102* 47.1  PO2ART 125* 73.7* 155*  HCO3 40.9* 41.9* 35.9*  O2SAT 98.5 93.4 100.0    Liver Function Tests: No results for input(s): AST, ALT, ALKPHOS, BILITOT, PROT, ALBUMIN in the last 168 hours. No results for input(s): LIPASE, AMYLASE in the last 168 hours. No results for input(s): AMMONIA in the last 168 hours.  CBC: Recent Labs  Lab 05/20/20 0439 05/21/20 1524 05/24/20 0339  WBC 7.2  --  6.3  HGB 6.7* 8.7* 9.1*  HCT 22.9* 28.0* 29.5*  MCV 105.0*  --  94.9  PLT 123*   --  141*    Cardiac Enzymes: No results for input(s): CKTOTAL, CKMB, CKMBINDEX, TROPONINI in the last 168 hours.  BNP (last 3 results) Recent Labs    09/06/19 1414 01/28/20 1503 02/25/20 1115  BNP 32.2 106.8* 282.7*    ProBNP (last 3 results) No results for input(s): PROBNP in the last 8760 hours.  Radiological Exams: No results found.  Assessment/Plan Active Problems:   Acute on chronic respiratory failure with hypoxia (HCC)   Seizure disorder (HCC)   COPD, severe (HCC)   Anxiety disorder   1. Acute on chronic respiratory failure hypoxia we will continue with the assist control titrate oxygen continue pulmonary toilet. 2. Seizure disorder no active seizure noted at this time. 3. Severe COPD no change 4. Anxiety disorder patient is at baseline   I have personally seen and evaluated the patient, evaluated laboratory and imaging results, formulated the assessment and plan and placed orders. The Patient requires high complexity decision making with multiple systems involvement.  Rounds were done with the Respiratory Therapy Director and Staff therapists and discussed with nursing staff also.  Yevonne Pax, MD [facility] Pulmonary Critical Care Medicine Sleep Medicine

## 2020-05-26 NOTE — Progress Notes (Signed)
Pulmonary Critical Care Medicine [facility] [place]   PULMONARY CRITICAL CARE SERVICE  PROGRESS NOTE  Date of Service: 05/26/2020  Jennika Ringgold  [id number]:[id number]  DOB: [birth year]   DOA: 04/10/2020  Referring Physician: Carron Curie, MD  HPI: Kathleen Cameron is a 55 y.o. female seen for follow up of Acute on Chronic Respiratory Failure.  Patient is on assist control mode has been on 40% FiO2 good saturations  Medications: Reviewed on Rounds  Physical Exam:  Vitals: Temperature is 97.1 pulse 62 respiratory 26 blood pressure is 136/88 saturations 100%  Ventilator Settings on assist control FiO2 is 40% tidal volume 480 with a PEEP of 5  . General: Comfortable at this time . Eyes: Grossly normal lids, irises & conjunctiva . ENT: grossly tongue is normal . Neck: no obvious mass . Cardiovascular: S1 S2 normal no gallop . Respiratory: Coarse rhonchi expansion is equal . Abdomen: soft . Skin: no rash seen on limited exam . Musculoskeletal: not rigid . Psychiatric:unable to assess . Neurologic: no seizure no involuntary movements         Lab Data:   Basic Metabolic Panel: Recent Labs  Lab 05/20/20 0439 05/24/20 0339  NA 140 140  K 3.9 3.7  CL 95* 99  CO2 35* 31  GLUCOSE 122* 108*  BUN 40* 30*  CREATININE 0.49 0.45  CALCIUM 8.5* 9.1    ABG: Recent Labs  Lab 05/19/20 1055  PHART 7.495*  PCO2ART 47.1  PO2ART 155*  HCO3 35.9*  O2SAT 100.0    Liver Function Tests: No results for input(s): AST, ALT, ALKPHOS, BILITOT, PROT, ALBUMIN in the last 168 hours. No results for input(s): LIPASE, AMYLASE in the last 168 hours. No results for input(s): AMMONIA in the last 168 hours.  CBC: Recent Labs  Lab 05/20/20 0439 05/21/20 1524 05/24/20 0339  WBC 7.2  --  6.3  HGB 6.7* 8.7* 9.1*  HCT 22.9* 28.0* 29.5*  MCV 105.0*  --  94.9  PLT 123*  --  141*    Cardiac Enzymes: No results for input(s): CKTOTAL, CKMB, CKMBINDEX, TROPONINI in the last 168  hours.  BNP (last 3 results) Recent Labs    09/06/19 1414 01/28/20 1503 02/25/20 1115  BNP 32.2 106.8* 282.7*    ProBNP (last 3 results) No results for input(s): PROBNP in the last 8760 hours.  Radiological Exams: No results found.  Assessment/Plan Active Problems:   Acute on chronic respiratory failure with hypoxia (HCC)   Seizure disorder (HCC)   COPD, severe (HCC)   Anxiety disorder   1. Acute on chronic respiratory failure hypoxia we will continue with assist control mode patient is on 40% FiO2 good saturations are noted. 2. Seizure disorder there is no active seizures. 3. Severe COPD patient is at baseline 4. Anxiety disorder no change we will continue to follow along   I have personally seen and evaluated the patient, evaluated laboratory and imaging results, formulated the assessment and plan and placed orders. The Patient requires high complexity decision making with multiple systems involvement.  Rounds were done with the Respiratory Therapy Director and Staff therapists and discussed with nursing staff also.  Yevonne Pax, MD [facility] Pulmonary Critical Care Medicine Sleep Medicine

## 2020-05-27 LAB — BASIC METABOLIC PANEL
Anion gap: 10 (ref 5–15)
BUN: 32 mg/dL — ABNORMAL HIGH (ref 6–20)
CO2: 32 mmol/L (ref 22–32)
Calcium: 9.4 mg/dL (ref 8.9–10.3)
Chloride: 98 mmol/L (ref 98–111)
Creatinine, Ser: 0.41 mg/dL — ABNORMAL LOW (ref 0.44–1.00)
GFR, Estimated: >60 mL/min (ref >60)
Glucose, Bld: 138 mg/dL — ABNORMAL HIGH (ref 70–99)
Potassium: 4.7 mmol/L (ref 3.5–5.1)
Sodium: 140 mmol/L (ref 135–145)

## 2020-05-27 LAB — CBC
HCT: 29.4 % — ABNORMAL LOW (ref 36.0–46.0)
Hemoglobin: 9 g/dL — ABNORMAL LOW (ref 12.0–15.0)
MCH: 29.3 pg (ref 26.0–34.0)
MCHC: 30.6 g/dL (ref 30.0–36.0)
MCV: 95.8 fL (ref 80.0–100.0)
Platelets: 189 10*3/uL (ref 150–400)
RBC: 3.07 MIL/uL — ABNORMAL LOW (ref 3.87–5.11)
RDW: 19.4 % — ABNORMAL HIGH (ref 11.5–15.5)
WBC: 8.6 10*3/uL (ref 4.0–10.5)
nRBC: 0.2 % (ref 0.0–0.2)

## 2020-05-27 LAB — TSH: TSH: 1.062 u[IU]/mL (ref 0.350–4.500)

## 2020-05-28 ENCOUNTER — Other Ambulatory Visit (HOSPITAL_COMMUNITY): Payer: Self-pay

## 2020-05-28 ENCOUNTER — Other Ambulatory Visit: Payer: Self-pay | Admitting: Emergency Medicine

## 2020-05-28 NOTE — Progress Notes (Signed)
Pulmonary Critical Care Medicine [facility] [place]   PULMONARY CRITICAL CARE SERVICE  PROGRESS NOTE  Date of Service: 05/28/2020  Kathleen Cameron  YTK:[id number]  DOB: 1965-08-10   DOA: 04/10/2020  Referring Physician: Carron Curie, MD  HPI: Kathleen Cameron is a 55 y.o. female seen for follow up of Acute on Chronic Respiratory Failure.  Patient at this time is on assist control has been on 40% FiO2 good tidal volumes are noted  Medications: Reviewed on Rounds  Physical Exam:  Vitals: Temperature is 97.5 pulse 63 respiratory 26 blood pressure 109/71 saturations 100%  Ventilator Settings on assist control FiO2 is 40% 480 PEEP of 5  . General: Comfortable at this time . Eyes: Grossly normal lids, irises & conjunctiva . ENT: grossly tongue is normal . Neck: no obvious mass . Cardiovascular: S1 S2 normal no gallop . Respiratory: Scattered rhonchi coarse breath sounds . Abdomen: soft . Skin: no rash seen on limited exam . Musculoskeletal: not rigid . Psychiatric:unable to assess . Neurologic: no seizure no involuntary movements         Lab Data:   Basic Metabolic Panel: Recent Labs  Lab 05/24/20 0339 05/27/20 0635  NA 140 140  K 3.7 4.7  CL 99 98  CO2 31 32  GLUCOSE 108* 138*  BUN 30* 32*  CREATININE 0.45 0.41*  CALCIUM 9.1 9.4    ABG: No results for input(s): PHART, PCO2ART, PO2ART, HCO3, O2SAT in the last 168 hours.  Liver Function Tests: No results for input(s): AST, ALT, ALKPHOS, BILITOT, PROT, ALBUMIN in the last 168 hours. No results for input(s): LIPASE, AMYLASE in the last 168 hours. No results for input(s): AMMONIA in the last 168 hours.  CBC: Recent Labs  Lab 05/24/20 0339 05/27/20 0635  WBC 6.3 8.6  HGB 9.1* 9.0*  HCT 29.5* 29.4*  MCV 94.9 95.8  PLT 141* 189    Cardiac Enzymes: No results for input(s): CKTOTAL, CKMB, CKMBINDEX, TROPONINI in the last 168 hours.  BNP (last 3 results) Recent Labs    09/06/19 1414  01/28/20 1503 02/25/20 1115  BNP 32.2 106.8* 282.7*    ProBNP (last 3 results) No results for input(s): PROBNP in the last 8760 hours.  Radiological Exams: DG CHEST PORT 1 VIEW  Result Date: 05/28/2020 CLINICAL DATA:  Pneumonia. EXAM: PORTABLE CHEST 1 VIEW COMPARISON:  Radiographs 05/20/2020 and 05/15/2020.  CT 02/28/2020. FINDINGS: 1323 hours. Tracheostomy appears unchanged. The heart size and mediastinal contours are stable. There has been interval partial clearing of the left lung airspace disease with residual linear opacities and mild volume loss in the left perihilar region. The lungs remain hyperinflated. There is evidence of prior granulomatous disease with calcified mediastinal lymph nodes. No pneumothorax or significant pleural effusion. IMPRESSION: Improving left lung airspace disease with residual linear opacities and volume loss in the left perihilar region. Electronically Signed   By: Carey Bullocks M.D.   On: 05/28/2020 13:43    Assessment/Plan Active Problems:   Acute on chronic respiratory failure with hypoxia (HCC)   Seizure disorder (HCC)   COPD, severe (HCC)   Anxiety disorder   1. Acute on chronic respiratory failure hypoxia we will continue with assist control titrate oxygen as tolerated. 2. Seizure disorder there is no active seizure noted at this time 3. Severe COPD terminal end-stage continue with supportive care 4. Anxiety disorder no change   I have personally seen and evaluated the patient, evaluated laboratory and imaging results, formulated the assessment and plan and placed orders.  The Patient requires high complexity decision making with multiple systems involvement.  Rounds were done with the Respiratory Therapy Director and Staff therapists and discussed with nursing staff also.  Allyne Gee, MD [facility] Pulmonary Critical Care Medicine Sleep Medicine

## 2020-05-29 NOTE — Progress Notes (Signed)
Pulmonary Critical Care Medicine [facility] [place]   PULMONARY CRITICAL CARE SERVICE  PROGRESS NOTE  Date of Service: 05/29/2020  Kathleen Cameron  IRJ:[id number]  DOB: 03/09/1966   DOA: 04/10/2020  Referring Physician: Carron Curie, MD  HPI: Kathleen Cameron is a 55 y.o. female seen for follow up of Acute on Chronic Respiratory Failure.  Patient currently is on assist control mode has been on 40% FiO2 good saturations are noted.  She is at her baseline home training underway  Medications: Reviewed on Rounds  Physical Exam:  Vitals: Temperature is 97.6 pulse 76 respiratory is 20 blood pressure is 118/65 saturations 97%  Ventilator Settings on assist control FiO2 is 40% tidal volume 480 with a PEEP of 5  . General: Comfortable at this time . Eyes: Grossly normal lids, irises & conjunctiva . ENT: grossly tongue is normal . Neck: no obvious mass . Cardiovascular: S1 S2 normal no gallop . Respiratory: Coarse breath sounds with a few scattered rhonchi . Abdomen: soft . Skin: no rash seen on limited exam . Musculoskeletal: not rigid . Psychiatric:unable to assess . Neurologic: no seizure no involuntary movements         Lab Data:   Basic Metabolic Panel: Recent Labs  Lab 05/24/20 0339 05/27/20 0635  NA 140 140  K 3.7 4.7  CL 99 98  CO2 31 32  GLUCOSE 108* 138*  BUN 30* 32*  CREATININE 0.45 0.41*  CALCIUM 9.1 9.4    ABG: No results for input(s): PHART, PCO2ART, PO2ART, HCO3, O2SAT in the last 168 hours.  Liver Function Tests: No results for input(s): AST, ALT, ALKPHOS, BILITOT, PROT, ALBUMIN in the last 168 hours. No results for input(s): LIPASE, AMYLASE in the last 168 hours. No results for input(s): AMMONIA in the last 168 hours.  CBC: Recent Labs  Lab 05/24/20 0339 05/27/20 0635  WBC 6.3 8.6  HGB 9.1* 9.0*  HCT 29.5* 29.4*  MCV 94.9 95.8  PLT 141* 189    Cardiac Enzymes: No results for input(s): CKTOTAL, CKMB, CKMBINDEX, TROPONINI  in the last 168 hours.  BNP (last 3 results) Recent Labs    09/06/19 1414 01/28/20 1503 02/25/20 1115  BNP 32.2 106.8* 282.7*    ProBNP (last 3 results) No results for input(s): PROBNP in the last 8760 hours.  Radiological Exams: DG CHEST PORT 1 VIEW  Result Date: 05/28/2020 CLINICAL DATA:  Pneumonia. EXAM: PORTABLE CHEST 1 VIEW COMPARISON:  Radiographs 05/20/2020 and 05/15/2020.  CT 02/28/2020. FINDINGS: 1323 hours. Tracheostomy appears unchanged. The heart size and mediastinal contours are stable. There has been interval partial clearing of the left lung airspace disease with residual linear opacities and mild volume loss in the left perihilar region. The lungs remain hyperinflated. There is evidence of prior granulomatous disease with calcified mediastinal lymph nodes. No pneumothorax or significant pleural effusion. IMPRESSION: Improving left lung airspace disease with residual linear opacities and volume loss in the left perihilar region. Electronically Signed   By: Carey Bullocks M.D.   On: 05/28/2020 13:43    Assessment/Plan Active Problems:   Acute on chronic respiratory failure with hypoxia (HCC)   Seizure disorder (HCC)   COPD, severe (HCC)   Anxiety disorder   1. Acute on chronic respiratory failure hypoxia we will continue with full support on assist control titrate oxygen continue pulmonary toilet. 2. Seizure disorder no active seizure noted at this time we will continue to follow 3. Severe COPD at baseline continue with present management 4. Anxiety disorder no change  continue to follow   I have personally seen and evaluated the patient, evaluated laboratory and imaging results, formulated the assessment and plan and placed orders. The Patient requires high complexity decision making with multiple systems involvement.  Rounds were done with the Respiratory Therapy Director and Staff therapists and discussed with nursing staff also.  Yevonne Pax, MD  [facility] Pulmonary Critical Care Medicine Sleep Medicine

## 2020-05-30 NOTE — Progress Notes (Signed)
Pulmonary Critical Care Medicine [facility] [place]   PULMONARY CRITICAL CARE SERVICE  PROGRESS NOTE  Date of Service: 05/30/2020  Tonisha Silvey  JOA:[id number]  DOB: 04/18/1966   DOA: 04/10/2020  Referring Physician: Carron Curie, MD  HPI: Kasi Lasky is a 55 y.o. female seen for follow up of Acute on Chronic Respiratory Failure.  At this time patient is on assist control mode has been on and off already percent FiO2 good saturations are noted.  Her family has been trained for home care  Medications: Reviewed on Rounds  Physical Exam:  Vitals: Temperature 97.1 pulse 85 respiratory rate is 22 blood pressure is 138/72 saturations 100%  Ventilator Settings on assist control FiO2 40% tidal volume 480 PEEP of 5  . General: Comfortable at this time . Eyes: Grossly normal lids, irises & conjunctiva . ENT: grossly tongue is normal . Neck: no obvious mass . Cardiovascular: S1 S2 normal no gallop . Respiratory: Scattered rhonchi expansion is equal at this time . Abdomen: soft . Skin: no rash seen on limited exam . Musculoskeletal: not rigid . Psychiatric:unable to assess . Neurologic: no seizure no involuntary movements         Lab Data:   Basic Metabolic Panel: Recent Labs  Lab 05/24/20 0339 05/27/20 0635  NA 140 140  K 3.7 4.7  CL 99 98  CO2 31 32  GLUCOSE 108* 138*  BUN 30* 32*  CREATININE 0.45 0.41*  CALCIUM 9.1 9.4    ABG: No results for input(s): PHART, PCO2ART, PO2ART, HCO3, O2SAT in the last 168 hours.  Liver Function Tests: No results for input(s): AST, ALT, ALKPHOS, BILITOT, PROT, ALBUMIN in the last 168 hours. No results for input(s): LIPASE, AMYLASE in the last 168 hours. No results for input(s): AMMONIA in the last 168 hours.  CBC: Recent Labs  Lab 05/24/20 0339 05/27/20 0635  WBC 6.3 8.6  HGB 9.1* 9.0*  HCT 29.5* 29.4*  MCV 94.9 95.8  PLT 141* 189    Cardiac Enzymes: No results for input(s): CKTOTAL, CKMB,  CKMBINDEX, TROPONINI in the last 168 hours.  BNP (last 3 results) Recent Labs    09/06/19 1414 01/28/20 1503 02/25/20 1115  BNP 32.2 106.8* 282.7*    ProBNP (last 3 results) No results for input(s): PROBNP in the last 8760 hours.  Radiological Exams: DG CHEST PORT 1 VIEW  Result Date: 05/28/2020 CLINICAL DATA:  Pneumonia. EXAM: PORTABLE CHEST 1 VIEW COMPARISON:  Radiographs 05/20/2020 and 05/15/2020.  CT 02/28/2020. FINDINGS: 1323 hours. Tracheostomy appears unchanged. The heart size and mediastinal contours are stable. There has been interval partial clearing of the left lung airspace disease with residual linear opacities and mild volume loss in the left perihilar region. The lungs remain hyperinflated. There is evidence of prior granulomatous disease with calcified mediastinal lymph nodes. No pneumothorax or significant pleural effusion. IMPRESSION: Improving left lung airspace disease with residual linear opacities and volume loss in the left perihilar region. Electronically Signed   By: Carey Bullocks M.D.   On: 05/28/2020 13:43    Assessment/Plan Active Problems:   Acute on chronic respiratory failure with hypoxia (HCC)   Seizure disorder (HCC)   COPD, severe (HCC)   Anxiety disorder   1. Acute on chronic respiratory failure with hypoxia we will continue with assist control titrate oxygen continue pulmonary toilet 2. Seizure disorder no active seizures 3. Severe COPD at baseline 4. Anxiety no change we will continue to monitor closely.   I have personally seen and evaluated  the patient, evaluated laboratory and imaging results, formulated the assessment and plan and placed orders. The Patient requires high complexity decision making with multiple systems involvement.  Rounds were done with the Respiratory Therapy Director and Staff therapists and discussed with nursing staff also.  Allyne Gee, MD [facility] Pulmonary Critical Care Medicine Sleep Medicine

## 2020-05-31 NOTE — Progress Notes (Signed)
Pulmonary Critical Care Medicine [facility] [place]   PULMONARY CRITICAL CARE SERVICE  PROGRESS NOTE  Date of Service: 05/31/2020  Kathleen Cameron  GLO:[id number]  DOB: 10-28-1965   DOA: 04/10/2020  Referring Physician: Carron Curie, MD  HPI: Kathleen Cameron is a 55 y.o. female seen for follow up of Acute on Chronic Respiratory Failure.  Patient at this time is on her home ventilator family in the room  Medications: Reviewed on Rounds  Physical Exam:  Vitals: Temperature is 97.2 pulse 63 respiratory rate 22 blood pressure is 123/80 saturations 100%  Ventilator Settings on the home ventilator for at home training  . General: Comfortable at this time . Eyes: Grossly normal lids, irises & conjunctiva . ENT: grossly tongue is normal . Neck: no obvious mass . Cardiovascular: S1 S2 normal no gallop . Respiratory: No rhonchi very coarse breath sounds . Abdomen: soft . Skin: no rash seen on limited exam . Musculoskeletal: not rigid . Psychiatric:unable to assess . Neurologic: no seizure no involuntary movements         Lab Data:   Basic Metabolic Panel: Recent Labs  Lab 05/27/20 0635  NA 140  K 4.7  CL 98  CO2 32  GLUCOSE 138*  BUN 32*  CREATININE 0.41*  CALCIUM 9.4    ABG: No results for input(s): PHART, PCO2ART, PO2ART, HCO3, O2SAT in the last 168 hours.  Liver Function Tests: No results for input(s): AST, ALT, ALKPHOS, BILITOT, PROT, ALBUMIN in the last 168 hours. No results for input(s): LIPASE, AMYLASE in the last 168 hours. No results for input(s): AMMONIA in the last 168 hours.  CBC: Recent Labs  Lab 05/27/20 0635  WBC 8.6  HGB 9.0*  HCT 29.4*  MCV 95.8  PLT 189    Cardiac Enzymes: No results for input(s): CKTOTAL, CKMB, CKMBINDEX, TROPONINI in the last 168 hours.  BNP (last 3 results) Recent Labs    09/06/19 1414 01/28/20 1503 02/25/20 1115  BNP 32.2 106.8* 282.7*    ProBNP (last 3 results) No results for input(s):  PROBNP in the last 8760 hours.  Radiological Exams: No results found.  Assessment/Plan Active Problems:   Acute on chronic respiratory failure with hypoxia (HCC)   Seizure disorder (HCC)   COPD, severe (HCC)   Anxiety disorder   1. Acute on chronic respiratory failure hypoxia we will continue with home vent training 2. Seizure disorder no active seizures 3. Severe COPD no change 4. Anxiety disorder no change continue to monitor   I have personally seen and evaluated the patient, evaluated laboratory and imaging results, formulated the assessment and plan and placed orders. The Patient requires high complexity decision making with multiple systems involvement.  Rounds were done with the Respiratory Therapy Director and Staff therapists and discussed with nursing staff also.  Yevonne Pax, MD [facility] Pulmonary Critical Care Medicine Sleep Medicine

## 2020-06-01 NOTE — Progress Notes (Signed)
Pulmonary Critical Care Medicine [facility] [place]   PULMONARY CRITICAL CARE SERVICE  PROGRESS NOTE  Date of Service: 06/01/2020  Kathleen Cameron  QQP:[id number]  DOB: 1965/04/25   DOA: 04/10/2020  Referring Physician: Carron Curie, MD  HPI: Kathleen Cameron is a 55 y.o. female seen for follow up of Acute on Chronic Respiratory Failure.  Patient is comfortable right now without distress supposed to be discharged today remains on assist control  Medications: Reviewed on Rounds  Physical Exam:  Vitals: Temperature 98.3 pulse 80 respiratory 24 blood pressure is 144/77 saturations 99%  Ventilator Settings on assist control  . General: Comfortable at this time . Eyes: Grossly normal lids, irises & conjunctiva . ENT: grossly tongue is normal . Neck: no obvious mass . Cardiovascular: S1 S2 normal no gallop . Respiratory: Scattered rhonchi expansion is equal . Abdomen: soft . Skin: no rash seen on limited exam . Musculoskeletal: not rigid . Psychiatric:unable to assess . Neurologic: no seizure no involuntary movements         Lab Data:   Basic Metabolic Panel: Recent Labs  Lab 05/27/20 0635  NA 140  K 4.7  CL 98  CO2 32  GLUCOSE 138*  BUN 32*  CREATININE 0.41*  CALCIUM 9.4    ABG: No results for input(s): PHART, PCO2ART, PO2ART, HCO3, O2SAT in the last 168 hours.  Liver Function Tests: No results for input(s): AST, ALT, ALKPHOS, BILITOT, PROT, ALBUMIN in the last 168 hours. No results for input(s): LIPASE, AMYLASE in the last 168 hours. No results for input(s): AMMONIA in the last 168 hours.  CBC: Recent Labs  Lab 05/27/20 0635  WBC 8.6  HGB 9.0*  HCT 29.4*  MCV 95.8  PLT 189    Cardiac Enzymes: No results for input(s): CKTOTAL, CKMB, CKMBINDEX, TROPONINI in the last 168 hours.  BNP (last 3 results) Recent Labs    09/06/19 1414 01/28/20 1503 02/25/20 1115  BNP 32.2 106.8* 282.7*    ProBNP (last 3 results) No results for  input(s): PROBNP in the last 8760 hours.  Radiological Exams: No results found.  Assessment/Plan Active Problems:   Acute on chronic respiratory failure with hypoxia (HCC)   Seizure disorder (HCC)   COPD, severe (HCC)   Anxiety disorder   1. Acute on chronic respiratory failure hypoxia continue with on full support on the ventilator and assist control tidal volume 480 2. Seizure disorder no active seizures 3. Severe COPD medical management 4. Anxiety disorder no change we will continue to follow   I have personally seen and evaluated the patient, evaluated laboratory and imaging results, formulated the assessment and plan and placed orders. The Patient requires high complexity decision making with multiple systems involvement.  Rounds were done with the Respiratory Therapy Director and Staff therapists and discussed with nursing staff also.  Yevonne Pax, MD [facility] Pulmonary Critical Care Medicine Sleep Medicine

## 2020-06-03 ENCOUNTER — Encounter (HOSPITAL_COMMUNITY): Payer: Self-pay | Admitting: Emergency Medicine

## 2020-06-03 ENCOUNTER — Emergency Department (HOSPITAL_COMMUNITY): Payer: No Typology Code available for payment source

## 2020-06-03 ENCOUNTER — Inpatient Hospital Stay (HOSPITAL_COMMUNITY)
Admission: EM | Admit: 2020-06-03 | Discharge: 2020-07-02 | DRG: 870 | Disposition: A | Discharge status: Home Health | Payer: No Typology Code available for payment source | Attending: Internal Medicine | Admitting: Internal Medicine

## 2020-06-03 DIAGNOSIS — J449 Chronic obstructive pulmonary disease, unspecified: Secondary | ICD-10-CM | POA: Diagnosis not present

## 2020-06-03 DIAGNOSIS — Z931 Gastrostomy status: Secondary | ICD-10-CM

## 2020-06-03 DIAGNOSIS — J9690 Respiratory failure, unspecified, unspecified whether with hypoxia or hypercapnia: Secondary | ICD-10-CM | POA: Diagnosis present

## 2020-06-03 DIAGNOSIS — R6520 Severe sepsis without septic shock: Secondary | ICD-10-CM | POA: Diagnosis present

## 2020-06-03 DIAGNOSIS — A419 Sepsis, unspecified organism: Secondary | ICD-10-CM | POA: Diagnosis present

## 2020-06-03 DIAGNOSIS — J9622 Acute and chronic respiratory failure with hypercapnia: Secondary | ICD-10-CM | POA: Diagnosis present

## 2020-06-03 DIAGNOSIS — Z20822 Contact with and (suspected) exposure to covid-19: Secondary | ICD-10-CM | POA: Diagnosis present

## 2020-06-03 DIAGNOSIS — K31811 Angiodysplasia of stomach and duodenum with bleeding: Secondary | ICD-10-CM | POA: Diagnosis present

## 2020-06-03 DIAGNOSIS — M79604 Pain in right leg: Secondary | ICD-10-CM | POA: Diagnosis not present

## 2020-06-03 DIAGNOSIS — K635 Polyp of colon: Secondary | ICD-10-CM | POA: Diagnosis present

## 2020-06-03 DIAGNOSIS — I82B12 Acute embolism and thrombosis of left subclavian vein: Secondary | ICD-10-CM | POA: Diagnosis not present

## 2020-06-03 DIAGNOSIS — E876 Hypokalemia: Secondary | ICD-10-CM | POA: Diagnosis not present

## 2020-06-03 DIAGNOSIS — J9600 Acute respiratory failure, unspecified whether with hypoxia or hypercapnia: Secondary | ICD-10-CM

## 2020-06-03 DIAGNOSIS — R1312 Dysphagia, oropharyngeal phase: Secondary | ICD-10-CM | POA: Diagnosis present

## 2020-06-03 DIAGNOSIS — J9601 Acute respiratory failure with hypoxia: Secondary | ICD-10-CM | POA: Diagnosis not present

## 2020-06-03 DIAGNOSIS — M79606 Pain in leg, unspecified: Secondary | ICD-10-CM

## 2020-06-03 DIAGNOSIS — D62 Acute posthemorrhagic anemia: Secondary | ICD-10-CM | POA: Diagnosis not present

## 2020-06-03 DIAGNOSIS — F419 Anxiety disorder, unspecified: Secondary | ICD-10-CM | POA: Diagnosis not present

## 2020-06-03 DIAGNOSIS — I10 Essential (primary) hypertension: Secondary | ICD-10-CM | POA: Diagnosis present

## 2020-06-03 DIAGNOSIS — J9602 Acute respiratory failure with hypercapnia: Secondary | ICD-10-CM | POA: Diagnosis not present

## 2020-06-03 DIAGNOSIS — J95850 Mechanical complication of respirator: Secondary | ICD-10-CM | POA: Diagnosis present

## 2020-06-03 DIAGNOSIS — G9341 Metabolic encephalopathy: Secondary | ICD-10-CM | POA: Diagnosis present

## 2020-06-03 DIAGNOSIS — Z888 Allergy status to other drugs, medicaments and biological substances status: Secondary | ICD-10-CM

## 2020-06-03 DIAGNOSIS — M25529 Pain in unspecified elbow: Secondary | ICD-10-CM | POA: Diagnosis not present

## 2020-06-03 DIAGNOSIS — Z9911 Dependence on respirator [ventilator] status: Secondary | ICD-10-CM

## 2020-06-03 DIAGNOSIS — R739 Hyperglycemia, unspecified: Secondary | ICD-10-CM | POA: Diagnosis not present

## 2020-06-03 DIAGNOSIS — J189 Pneumonia, unspecified organism: Secondary | ICD-10-CM | POA: Diagnosis present

## 2020-06-03 DIAGNOSIS — E162 Hypoglycemia, unspecified: Secondary | ICD-10-CM | POA: Diagnosis not present

## 2020-06-03 DIAGNOSIS — Z6821 Body mass index (BMI) 21.0-21.9, adult: Secondary | ICD-10-CM

## 2020-06-03 DIAGNOSIS — M25561 Pain in right knee: Secondary | ICD-10-CM | POA: Diagnosis not present

## 2020-06-03 DIAGNOSIS — E039 Hypothyroidism, unspecified: Secondary | ICD-10-CM | POA: Diagnosis not present

## 2020-06-03 DIAGNOSIS — G40909 Epilepsy, unspecified, not intractable, without status epilepticus: Secondary | ICD-10-CM | POA: Diagnosis present

## 2020-06-03 DIAGNOSIS — K5909 Other constipation: Secondary | ICD-10-CM | POA: Diagnosis present

## 2020-06-03 DIAGNOSIS — I959 Hypotension, unspecified: Secondary | ICD-10-CM | POA: Diagnosis not present

## 2020-06-03 DIAGNOSIS — L89512 Pressure ulcer of right ankle, stage 2: Secondary | ICD-10-CM | POA: Diagnosis present

## 2020-06-03 DIAGNOSIS — E440 Moderate protein-calorie malnutrition: Secondary | ICD-10-CM | POA: Diagnosis present

## 2020-06-03 DIAGNOSIS — Z930 Tracheostomy status: Secondary | ICD-10-CM

## 2020-06-03 DIAGNOSIS — L03114 Cellulitis of left upper limb: Secondary | ICD-10-CM | POA: Diagnosis not present

## 2020-06-03 DIAGNOSIS — M109 Gout, unspecified: Secondary | ICD-10-CM | POA: Diagnosis not present

## 2020-06-03 DIAGNOSIS — R633 Feeding difficulties, unspecified: Secondary | ICD-10-CM | POA: Diagnosis present

## 2020-06-03 DIAGNOSIS — L89312 Pressure ulcer of right buttock, stage 2: Secondary | ICD-10-CM | POA: Diagnosis present

## 2020-06-03 DIAGNOSIS — M79671 Pain in right foot: Secondary | ICD-10-CM | POA: Diagnosis not present

## 2020-06-03 DIAGNOSIS — R079 Chest pain, unspecified: Secondary | ICD-10-CM

## 2020-06-03 DIAGNOSIS — L8990 Pressure ulcer of unspecified site, unspecified stage: Secondary | ICD-10-CM | POA: Insufficient documentation

## 2020-06-03 DIAGNOSIS — Y95 Nosocomial condition: Secondary | ICD-10-CM | POA: Diagnosis present

## 2020-06-03 DIAGNOSIS — R0602 Shortness of breath: Secondary | ICD-10-CM

## 2020-06-03 DIAGNOSIS — M79673 Pain in unspecified foot: Secondary | ICD-10-CM

## 2020-06-03 DIAGNOSIS — Z7982 Long term (current) use of aspirin: Secondary | ICD-10-CM

## 2020-06-03 DIAGNOSIS — Y848 Other medical procedures as the cause of abnormal reaction of the patient, or of later complication, without mention of misadventure at the time of the procedure: Secondary | ICD-10-CM | POA: Diagnosis present

## 2020-06-03 DIAGNOSIS — Z79899 Other long term (current) drug therapy: Secondary | ICD-10-CM

## 2020-06-03 DIAGNOSIS — A411 Sepsis due to other specified staphylococcus: Secondary | ICD-10-CM | POA: Diagnosis present

## 2020-06-03 DIAGNOSIS — J9621 Acute and chronic respiratory failure with hypoxia: Secondary | ICD-10-CM | POA: Diagnosis present

## 2020-06-03 DIAGNOSIS — Z79890 Hormone replacement therapy (postmenopausal): Secondary | ICD-10-CM

## 2020-06-03 DIAGNOSIS — J439 Emphysema, unspecified: Secondary | ICD-10-CM | POA: Diagnosis present

## 2020-06-03 DIAGNOSIS — R609 Edema, unspecified: Secondary | ICD-10-CM | POA: Diagnosis not present

## 2020-06-03 LAB — COMPREHENSIVE METABOLIC PANEL
ALT: 88 U/L — ABNORMAL HIGH (ref 0–44)
AST: 149 U/L — ABNORMAL HIGH (ref 15–41)
Albumin: 2.7 g/dL — ABNORMAL LOW (ref 3.5–5.0)
Alkaline Phosphatase: 163 U/L — ABNORMAL HIGH (ref 38–126)
Anion gap: 13 (ref 5–15)
BUN: 41 mg/dL — ABNORMAL HIGH (ref 6–20)
CO2: 28 mmol/L (ref 22–32)
Calcium: 9.1 mg/dL (ref 8.9–10.3)
Chloride: 98 mmol/L (ref 98–111)
Creatinine, Ser: 0.6 mg/dL (ref 0.44–1.00)
GFR, Estimated: >60 mL/min (ref >60)
Glucose, Bld: 158 mg/dL — ABNORMAL HIGH (ref 70–99)
Potassium: 4.4 mmol/L (ref 3.5–5.1)
Sodium: 139 mmol/L (ref 135–145)
Total Bilirubin: 0.7 mg/dL (ref 0.3–1.2)
Total Protein: 6.1 g/dL — ABNORMAL LOW (ref 6.5–8.1)

## 2020-06-03 LAB — MAGNESIUM
Magnesium: 2.4 mg/dL (ref 1.7–2.4)
Magnesium: 2.1 mg/dL (ref 1.7–2.4)

## 2020-06-03 LAB — CBC
HCT: 32.5 % — ABNORMAL LOW (ref 36.0–46.0)
HCT: 35.1 % — ABNORMAL LOW (ref 36.0–46.0)
Hemoglobin: 10 g/dL — ABNORMAL LOW (ref 12.0–15.0)
Hemoglobin: 10.3 g/dL — ABNORMAL LOW (ref 12.0–15.0)
MCH: 28.5 pg (ref 26.0–34.0)
MCH: 29 pg (ref 26.0–34.0)
MCHC: 29.3 g/dL — ABNORMAL LOW (ref 30.0–36.0)
MCHC: 30.8 g/dL (ref 30.0–36.0)
MCV: 94.2 fL (ref 80.0–100.0)
MCV: 97 fL (ref 80.0–100.0)
Platelets: 284 10*3/uL (ref 150–400)
Platelets: 326 10*3/uL (ref 150–400)
RBC: 3.45 MIL/uL — ABNORMAL LOW (ref 3.87–5.11)
RBC: 3.62 MIL/uL — ABNORMAL LOW (ref 3.87–5.11)
RDW: 18.3 % — ABNORMAL HIGH (ref 11.5–15.5)
RDW: 18.5 % — ABNORMAL HIGH (ref 11.5–15.5)
WBC: 19.1 10*3/uL — ABNORMAL HIGH (ref 4.0–10.5)
WBC: 24.2 10*3/uL — ABNORMAL HIGH (ref 4.0–10.5)
nRBC: 0.1 % (ref 0.0–0.2)
nRBC: 0.3 % — ABNORMAL HIGH (ref 0.0–0.2)

## 2020-06-03 LAB — I-STAT VENOUS BLOOD GAS, ED
Acid-Base Excess: 5 mmol/L — ABNORMAL HIGH (ref 0.0–2.0)
Bicarbonate: 33 mmol/L — ABNORMAL HIGH (ref 20.0–28.0)
Calcium, Ion: 1.18 mmol/L (ref 1.15–1.40)
HCT: 33 % — ABNORMAL LOW (ref 36.0–46.0)
Hemoglobin: 11.2 g/dL — ABNORMAL LOW (ref 12.0–15.0)
O2 Saturation: 99 %
Potassium: 4.4 mmol/L (ref 3.5–5.1)
Sodium: 139 mmol/L (ref 135–145)
TCO2: 35 mmol/L — ABNORMAL HIGH (ref 22–32)
pCO2, Ven: 63 mmHg — ABNORMAL HIGH (ref 44.0–60.0)
pH, Ven: 7.326 (ref 7.250–7.430)
pO2, Ven: 154 mmHg — ABNORMAL HIGH (ref 32.0–45.0)

## 2020-06-03 LAB — POC SARS CORONAVIRUS 2 AG -  ED: SARS Coronavirus 2 Ag: NEGATIVE

## 2020-06-03 LAB — CREATININE, SERUM
Creatinine, Ser: 0.5 mg/dL (ref 0.44–1.00)
GFR, Estimated: >60 mL/min (ref >60)

## 2020-06-03 LAB — SARS CORONAVIRUS 2 (TAT 6-24 HRS): SARS Coronavirus 2: NEGATIVE

## 2020-06-03 LAB — LACTIC ACID, PLASMA: Lactic Acid, Venous: 2.3 mmol/L (ref 0.5–1.9)

## 2020-06-03 LAB — MRSA PCR SCREENING: MRSA by PCR: NEGATIVE

## 2020-06-03 LAB — GLUCOSE, CAPILLARY
Glucose-Capillary: 100 mg/dL — ABNORMAL HIGH (ref 70–99)
Glucose-Capillary: 103 mg/dL — ABNORMAL HIGH (ref 70–99)
Glucose-Capillary: 112 mg/dL — ABNORMAL HIGH (ref 70–99)
Glucose-Capillary: 90 mg/dL (ref 70–99)
Glucose-Capillary: 92 mg/dL (ref 70–99)
Glucose-Capillary: 93 mg/dL (ref 70–99)

## 2020-06-03 LAB — TROPONIN I (HIGH SENSITIVITY)
Troponin I (High Sensitivity): 31 ng/L — ABNORMAL HIGH (ref <18)
Troponin I (High Sensitivity): 59 ng/L — ABNORMAL HIGH (ref <18)

## 2020-06-03 LAB — PROCALCITONIN: Procalcitonin: 0.45 ng/mL

## 2020-06-03 LAB — PHOSPHORUS
Phosphorus: 4.9 mg/dL — ABNORMAL HIGH (ref 2.5–4.6)
Phosphorus: 4.2 mg/dL (ref 2.5–4.6)

## 2020-06-03 LAB — CBG MONITORING, ED: Glucose-Capillary: 124 mg/dL — ABNORMAL HIGH (ref 70–99)

## 2020-06-03 MED ORDER — ONDANSETRON HCL 4 MG/2ML IJ SOLN
4.0000 mg | Freq: Four times a day (QID) | INTRAMUSCULAR | Status: DC | PRN
  Administered 2020-06-11: 4 mg via INTRAVENOUS
  Filled 2020-06-03: qty 2

## 2020-06-03 MED ORDER — PROSOURCE TF PO LIQD
45.0000 mL | Freq: Two times a day (BID) | ORAL | Status: DC
  Administered 2020-06-03 – 2020-06-04 (×3): 45 mL
  Filled 2020-06-03 (×4): qty 45

## 2020-06-03 MED ORDER — SODIUM CHLORIDE 0.9 % IV SOLN
2.0000 g | Freq: Three times a day (TID) | INTRAVENOUS | Status: AC
  Administered 2020-06-03 – 2020-06-09 (×19): 2 g via INTRAVENOUS
  Filled 2020-06-03 (×20): qty 2

## 2020-06-03 MED ORDER — AMLODIPINE BESYLATE 10 MG PO TABS
10.0000 mg | ORAL_TABLET | Freq: Every day | ORAL | Status: DC
  Administered 2020-06-03 – 2020-06-10 (×8): 10 mg
  Filled 2020-06-03 (×9): qty 1

## 2020-06-03 MED ORDER — VANCOMYCIN HCL 500 MG/100ML IV SOLN: 500.0000 mg | Freq: Two times a day (BID) | INTRAVENOUS | Status: DC

## 2020-06-03 MED ORDER — BISACODYL 10 MG RE SUPP: 10.0000 mg | Freq: Every day | RECTAL | Status: DC | PRN

## 2020-06-03 MED ORDER — METOPROLOL TARTRATE 12.5 MG HALF TABLET
12.5000 mg | ORAL_TABLET | Freq: Once | ORAL | Status: AC | PRN
  Administered 2020-06-04: 12.5 mg via ORAL
  Filled 2020-06-03: qty 1

## 2020-06-03 MED ORDER — VITAL HIGH PROTEIN PO LIQD
1000.0000 mL | ORAL | Status: DC
  Administered 2020-06-03 – 2020-06-04 (×2): 1000 mL
  Filled 2020-06-03: qty 1000

## 2020-06-03 MED ORDER — POLYETHYLENE GLYCOL 3350 17 G PO PACK
17.0000 g | PACK | Freq: Every day | ORAL | Status: DC | PRN
  Administered 2020-06-23 – 2020-07-01 (×3): 17 g
  Filled 2020-06-03 (×3): qty 1

## 2020-06-03 MED ORDER — VENLAFAXINE HCL 37.5 MG PO TABS
37.5000 mg | ORAL_TABLET | Freq: Two times a day (BID) | ORAL | Status: DC
  Administered 2020-06-03 – 2020-07-02 (×57): 37.5 mg
  Filled 2020-06-03 (×61): qty 1

## 2020-06-03 MED ORDER — DOCUSATE SODIUM 50 MG/5ML PO LIQD
100.0000 mg | Freq: Two times a day (BID) | ORAL | Status: DC
  Administered 2020-06-04 – 2020-06-08 (×6): 100 mg
  Filled 2020-06-03 (×14): qty 10

## 2020-06-03 MED ORDER — CHLORHEXIDINE GLUCONATE CLOTH 2 % EX PADS
6.0000 | MEDICATED_PAD | Freq: Every day | CUTANEOUS | Status: DC
  Administered 2020-06-03 – 2020-06-28 (×25): 6 via TOPICAL

## 2020-06-03 MED ORDER — POLYETHYLENE GLYCOL 3350 17 G PO PACK
17.0000 g | PACK | Freq: Every day | ORAL | Status: DC
  Administered 2020-06-05 – 2020-06-08 (×2): 17 g
  Filled 2020-06-03 (×8): qty 1

## 2020-06-03 MED ORDER — CHLORHEXIDINE GLUCONATE 0.12% ORAL RINSE (MEDLINE KIT)
15.0000 mL | Freq: Two times a day (BID) | OROMUCOSAL | Status: DC
  Administered 2020-06-03 – 2020-07-02 (×56): 15 mL via OROMUCOSAL

## 2020-06-03 MED ORDER — VANCOMYCIN HCL 1000 MG/200ML IV SOLN
1000.0000 mg | Freq: Once | INTRAVENOUS | Status: AC
  Administered 2020-06-03: 1000 mg via INTRAVENOUS
  Filled 2020-06-03: qty 200

## 2020-06-03 MED ORDER — HEPARIN SODIUM (PORCINE) 5000 UNIT/ML IJ SOLN
5000.0000 [IU] | Freq: Three times a day (TID) | INTRAMUSCULAR | Status: DC
  Administered 2020-06-03 – 2020-06-04 (×3): 5000 [IU] via SUBCUTANEOUS
  Filled 2020-06-03 (×3): qty 1

## 2020-06-03 MED ORDER — CLONAZEPAM 1 MG PO TABS
1.0000 mg | ORAL_TABLET | Freq: Three times a day (TID) | ORAL | Status: DC
  Administered 2020-06-03 – 2020-06-19 (×49): 1 mg
  Filled 2020-06-03 (×49): qty 1

## 2020-06-03 MED ORDER — ASPIRIN 81 MG PO CHEW
81.0000 mg | CHEWABLE_TABLET | Freq: Every day | ORAL | Status: DC
  Administered 2020-06-03 – 2020-07-02 (×30): 81 mg
  Filled 2020-06-03 (×30): qty 1

## 2020-06-03 MED ORDER — DOCUSATE SODIUM 50 MG/5ML PO LIQD
100.0000 mg | Freq: Two times a day (BID) | ORAL | Status: DC | PRN
  Administered 2020-06-23 – 2020-07-01 (×3): 100 mg
  Filled 2020-06-03 (×3): qty 10

## 2020-06-03 MED ORDER — LEVOTHYROXINE SODIUM 25 MCG PO TABS
50.0000 ug | ORAL_TABLET | Freq: Every day | ORAL | Status: DC
  Administered 2020-06-04 – 2020-07-02 (×29): 50 ug
  Filled 2020-06-03 (×29): qty 2

## 2020-06-03 MED ORDER — SODIUM CHLORIDE 0.9 % IV SOLN
INTRAVENOUS | Status: DC | PRN
  Administered 2020-06-03: 250 mL via INTRAVENOUS
  Administered 2020-06-08: 1000 mL via INTRAVENOUS

## 2020-06-03 MED ORDER — ALBUTEROL SULFATE (2.5 MG/3ML) 0.083% IN NEBU
2.5000 mg | INHALATION_SOLUTION | Freq: Four times a day (QID) | RESPIRATORY_TRACT | Status: DC
  Administered 2020-06-03 – 2020-06-10 (×30): 2.5 mg via RESPIRATORY_TRACT
  Filled 2020-06-03 (×29): qty 3

## 2020-06-03 MED ORDER — TEMAZEPAM 15 MG PO CAPS
15.0000 mg | ORAL_CAPSULE | Freq: Every day | ORAL | Status: DC
  Administered 2020-06-03 – 2020-07-01 (×28): 15 mg
  Filled 2020-06-03 (×28): qty 1

## 2020-06-03 MED ORDER — PANTOPRAZOLE SODIUM 40 MG PO PACK
40.0000 mg | PACK | Freq: Every day | ORAL | Status: DC
  Administered 2020-06-03 – 2020-06-24 (×21): 40 mg
  Filled 2020-06-03 (×24): qty 20

## 2020-06-03 MED ORDER — ORAL CARE MOUTH RINSE
15.0000 mL | OROMUCOSAL | Status: DC
  Administered 2020-06-03 – 2020-07-02 (×266): 15 mL via OROMUCOSAL

## 2020-06-03 MED ORDER — FENTANYL CITRATE (PF) 100 MCG/2ML IJ SOLN
50.0000 ug | INTRAMUSCULAR | Status: DC | PRN
  Administered 2020-06-03 – 2020-06-09 (×12): 100 ug via INTRAVENOUS
  Administered 2020-06-09 – 2020-06-10 (×2): 50 ug via INTRAVENOUS
  Administered 2020-06-10: 100 ug via INTRAVENOUS
  Administered 2020-06-13: 50 ug via INTRAVENOUS
  Administered 2020-06-14: 100 ug via INTRAVENOUS
  Administered 2020-06-14 – 2020-06-27 (×6): 50 ug via INTRAVENOUS
  Filled 2020-06-03 (×24): qty 2

## 2020-06-03 MED ORDER — ROSUVASTATIN CALCIUM 20 MG PO TABS
40.0000 mg | ORAL_TABLET | Freq: Every day | ORAL | Status: DC
  Administered 2020-06-04 – 2020-07-02 (×29): 40 mg
  Filled 2020-06-03 (×29): qty 2

## 2020-06-03 MED ORDER — BUDESONIDE 0.25 MG/2ML IN SUSP
0.2500 mg | Freq: Two times a day (BID) | RESPIRATORY_TRACT | Status: DC
  Administered 2020-06-03 – 2020-07-02 (×58): 0.25 mg via RESPIRATORY_TRACT
  Filled 2020-06-03 (×58): qty 2

## 2020-06-03 MED ORDER — FAMOTIDINE IN NACL 20-0.9 MG/50ML-% IV SOLN
20.0000 mg | Freq: Two times a day (BID) | INTRAVENOUS | Status: DC
  Administered 2020-06-03 – 2020-06-05 (×5): 20 mg via INTRAVENOUS
  Filled 2020-06-03 (×6): qty 50

## 2020-06-03 NOTE — Progress Notes (Signed)
Spoke w/ pts husband to provide updates. Pts husband appreciative.  

## 2020-06-03 NOTE — ED Provider Notes (Signed)
[facility] Emergency Department Provider Note MRN:  [id number]  Arrival date & time: 06/03/20     Chief Complaint   Respiratory Distress   History of Present Illness   Kathleen Cameron is a 55 y.o. year-old female with a history of hypertension, COPD presenting to the ED with chief complaint of respiratory distress.  History obtained from patient's husband over the phone. Patient was just discharged home today. Patient was due for nebulizer treatment at midnight. Husband noticed that suddenly the ventilator stopped functioning, screen turned red and beeping loudly. Patient initially doing fine but gradually became more and more short of breath and less and less responsive. He called EMS and was started CPR and bag-valve-mask ventilation upon EMS arrival. Patient improving and following commands here in the emergency department after bag-valve-mask ventilation with EMS. Denies any pain, feels mildly short of breath.  I was unable to obtain an accurate HPI, PMH, or ROS due to the patient's trach dependence.  Level 5 caveat.  Review of Systems  Positive for unresponsiveness, shortness of breath.  Patient's Health History    Past Medical History:  Diagnosis Date  . Acute on chronic respiratory failure with hypoxia ([place])   . Anxiety disorder   . Asthma   . COPD (chronic obstructive pulmonary disease) ([place])   . COPD, severe ([place])   . Hypertension   . Seizure disorder [facility])     Past Surgical History:  Procedure Laterality Date  . IR FLUORO RM 30-60 MIN  03/23/2020  . LAPAROSCOPIC INSERTION GASTROSTOMY TUBE N/A 04/24/2020   Procedure: LAPAROSCOPIC GASTROSTOMY TUBE PLACEMENT;  Surgeon: Kieth Brightly Arta Bruce, MD;  Location: [facility];  Service: General;  Laterality: N/A;  . WISDOM TOOTH EXTRACTION      Family History  Family history unknown: Yes    Social History   Socioeconomic History  . Marital status: Married    Spouse name: Not on file  . Number of  children: Not on file  . Years of education: Not on file  . Highest education level: Not on file  Occupational History  . Not on file  Tobacco Use  . Smoking status: Never Smoker  . Smokeless tobacco: Never Used  Vaping Use  . Vaping Use: Never used  Substance and Sexual Activity  . Alcohol use: Yes    Alcohol/week: 2.0 standard drinks    Types: 2 Cans of beer per week  . Drug use: Never  . Sexual activity: Not on file  Other Topics Concern  . Not on file  Social History Narrative  . Not on file   Social Determinants of Health   Financial Resource Strain: Not on file  Food Insecurity: No Food Insecurity  . Worried About Charity fundraiser in the Last Year: Never true  . Ran Out of Food in the Last Year: Never true  Transportation Needs: No Transportation Needs  . Lack of Transportation (Medical): No  . Lack of Transportation (Non-Medical): No  Physical Activity: Not on file  Stress: Not on file  Social Connections: Not on file  Intimate Partner Violence: Not on file     Physical Exam   Vitals:   06/03/20 0538 06/03/20 0550  BP: (!) 179/89   Pulse: 97   Resp: 19   Temp:  98.8 F (37.1 C)  SpO2: 94%     CONSTITUTIONAL: Chronically ill-appearing, NAD NEURO:  Alert follows commands, moves all extremities EYES:  eyes equal and reactive ENT/NECK:  no LAD, no JVD  CARDIO: Regular rate, well-perfused, normal S1 and S2 PULM: Scattered rhonchi, diminished air movement bilaterally GI/GU:  normal bowel sounds, non-distended, non-tender MSK/SPINE:  No gross deformities, no edema SKIN:  no rash, atraumatic PSYCH:  Appropriate speech and behavior  *Additional and/or pertinent findings included in MDM below  Diagnostic and Interventional Summary    EKG Interpretation  Date/Time:  Sunday June 03 2020 00:36:53 EST Ventricular Rate:  120 PR Interval:    QRS Duration: 106 QT Interval:  341 QTC Calculation: 482 R Axis:   -166 Text Interpretation: Sinus  tachycardia Consider right atrial enlargement Right axis deviation Low voltage, extremity and precordial leads Probable anteroseptal infarct, old Artifact in lead(s) I II III aVR aVL aVF V2 V4 V5 Confirmed by Gerlene Fee [phone]) on 06/03/2020 1:10:14 AM      Labs Reviewed  CBC - Abnormal; Notable for the following components:      Result Value   WBC 24.2 (*)    RBC 3.62 (*)    Hemoglobin 10.3 (*)    HCT 35.1 (*)    MCHC 29.3 (*)    RDW 18.3 (*)    nRBC 0.3 (*)    All other components within normal limits  COMPREHENSIVE METABOLIC PANEL - Abnormal; Notable for the following components:   Glucose, Bld 158 (*)    BUN 41 (*)    Total Protein 6.1 (*)    Albumin 2.7 (*)    AST 149 (*)    ALT 88 (*)    Alkaline Phosphatase 163 (*)    All other components within normal limits  LACTIC ACID, PLASMA - Abnormal; Notable for the following components:   Lactic Acid, Venous 2.3 (*)    All other components within normal limits  CBC - Abnormal; Notable for the following components:   WBC 19.1 (*)    RBC 3.45 (*)    Hemoglobin 10.0 (*)    HCT 32.5 (*)    RDW 18.5 (*)    All other components within normal limits  PHOSPHORUS - Abnormal; Notable for the following components:   Phosphorus 4.9 (*)    All other components within normal limits  GLUCOSE, CAPILLARY - Abnormal; Notable for the following components:   Glucose-Capillary 112 (*)    All other components within normal limits  I-STAT VENOUS BLOOD GAS, ED - Abnormal; Notable for the following components:   pCO2, Ven 63.0 (*)    pO2, Ven 154.0 (*)    Bicarbonate 33.0 (*)    TCO2 35 (*)    Acid-Base Excess 5.0 (*)    HCT 33.0 (*)    Hemoglobin 11.2 (*)    All other components within normal limits  CBG MONITORING, ED - Abnormal; Notable for the following components:   Glucose-Capillary 124 (*)    All other components within normal limits  TROPONIN I (HIGH SENSITIVITY) - Abnormal; Notable for the following components:   Troponin I  (High Sensitivity) 31 (*)    All other components within normal limits  TROPONIN I (HIGH SENSITIVITY) - Abnormal; Notable for the following components:   Troponin I (High Sensitivity) 59 (*)    All other components within normal limits  SARS CORONAVIRUS 2 (TAT 6-24 HRS)  CULTURE, BLOOD (ROUTINE X 2)  CULTURE, BLOOD (ROUTINE X 2)  CULTURE, RESPIRATORY  URINE CULTURE  MRSA PCR SCREENING  CREATININE, SERUM  MAGNESIUM  PROCALCITONIN  MAGNESIUM  PHOSPHORUS  POC SARS CORONAVIRUS 2 AG -  ED    DG Chest [facility] [address]  Final Result      Medications  docusate (COLACE) 50 MG/5ML liquid 100 mg (has no administration in time range)  polyethylene glycol (MIRALAX / GLYCOLAX) packet 17 g (has no administration in time range)  heparin injection 5,000 Units (has no administration in time range)  ondansetron (ZOFRAN) injection 4 mg (has no administration in time range)  feeding supplement (VITAL HIGH PROTEIN) liquid 1,000 mL (1,000 mLs Per Tube Not Given 06/03/20 0410)  feeding supplement (PROSource TF) liquid 45 mL (has no administration in time range)  docusate (COLACE) 50 MG/5ML liquid 100 mg (has no administration in time range)  polyethylene glycol (MIRALAX / GLYCOLAX) packet 17 g (has no administration in time range)  fentaNYL (SUBLIMAZE) injection 50-100 mcg (100 mcg Intravenous Given 06/03/20 0605)  albuterol (PROVENTIL) (2.5 MG/3ML) 0.083% nebulizer solution 2.5 mg (2.5 mg Nebulization Not Given 06/03/20 0427)  vancomycin (VANCOREADY) IVPB 500 mg/100 mL (has no administration in time range)  ceFEPIme (MAXIPIME) 2 g in sodium chloride 0.9 % 100 mL IVPB (0 g Intravenous Stopped 06/03/20 0446)  famotidine (PEPCID) IVPB 20 mg premix (has no administration in time range)  chlorhexidine gluconate (MEDLINE KIT) (PERIDEX) 0.12 % solution 15 mL (has no administration in time range)  MEDLINE mouth rinse (has no administration in time range)  Chlorhexidine Gluconate Cloth 2 % PADS 6 each (6 each Topical  Given 06/03/20 0555)  vancomycin (VANCOREADY) IVPB 1000 mg/200 mL (0 mg Intravenous Stopped 06/03/20 0528)     Procedures  /  Critical Care .Critical Care Performed by: Maudie Flakes, MD Authorized by: Maudie Flakes, MD   Critical care provider statement:    Critical care time (minutes):  45   Critical care was necessary to treat or prevent imminent or life-threatening deterioration of the following conditions:  Respiratory failure   Critical care was time spent personally by me on the following activities:  Discussions with consultants, evaluation of patient's response to treatment, examination of patient, ordering and performing treatments and interventions, ordering and review of laboratory studies, ordering and review of radiographic studies, pulse oximetry, re-evaluation of patient's condition, obtaining history from patient or surrogate and review of old charts    ED Course and Medical Decision Making  I have reviewed the triage vital signs, the nursing notes, and pertinent available records from the EMR.  Listed above are laboratory and imaging tests that I personally ordered, reviewed, and interpreted and then considered in my medical decision making (see below for details).  History is suggestive of failure of the ventilator at home followed by either hypoxic or hypercarbic respiratory failure and unresponsiveness. Patient seems to have recovered well with bag-valve-mask. She chronically requires assist control ventilation per notes. Suspect will need readmission to figure out why this happened.     Chest x-ray reveals possible pneumonia, troponin elevated, given vent dependence admitted to critical care.  Barth Kirks. Sedonia Small, MD [place] [email or web address]  Final Clinical Impressions(s) / ED Diagnoses     ICD-10-CM   1. Acute respiratory failure, unspecified whether with hypoxia or hypercapnia ([place])  J96.00     ED  Discharge Orders    None       Discharge Instructions Discussed with and Provided to Patient:   Discharge Instructions   None       Maudie Flakes, MD 06/03/20 [phone]

## 2020-06-03 NOTE — Progress Notes (Signed)
NAME:  Kathleen Cameron, MRN:  [id number], DOB:  June 12, 1965, LOS: 0 ADMISSION DATE:  06/03/2020, CONSULTATION DATE:  06/02/2020 REFERRING MD:  Dr. Pilar Plate, CHIEF COMPLAINT: Acute respiratory distress  Brief History:  55 year old female presented from home via EMS with complaints of acute respiratory distress.  [facility]  PCCM consulted for further management and admission  History of Present Illness:  Kathleen Cameron is a 55 y.o. with a past medical history significant for seizure disorder, hypertension, severe COPD, acute on chronic hypoxic and hypercapnic respiratory failure, and anxiety who presented to the ED via EMS with acute on chronic hypoxic and hypercapnic respiratory failure.  Of note [facility].  Primary history obtained from chart review as patient is currently on ventilator through chronic trach.  It appears patient was treated at this facility November 2021 for acute on chronic hypoxic respiratory failure she required endotracheal intubation and eventual tracheostomy due to difficulty weaning 03/02/2020.  On 11[facility]  Upon discharge home per chart review it appears patient's home ventilator malfunction much with resulted in respiratory failure and need for bag mask ventilation.  On arrival to ED patient was placed on ventilator with home vent settings with no further acute respiratory compromise.  PCCM was called for further management and admission.   Past Medical History:  Seizure disorder Hypertension Severe COPD Acute on chronic hypoxic and hypercapnic respiratory failure Anxiety  Significant Hospital Events:  Admitted 2/13  Consults:    Procedures:    Significant Diagnostic Tests:  Chest x-ray 2/13 > worsening left perihilar airspace disease with increased patchy opacities and bronchial thickening suspicious for worsening  pneumonia  Micro Data:  Covid 2/13 > Blood culture 2/13 > Sputum culture 2/13 > MRSA PCR 2/13 >  Antimicrobials:  Cefepime 2/13 > Vancomycin 2/13 >  Interim History / Subjective:  Resting comfortably on ventilator this morning, denies pain.   Objective   Blood pressure (!) 179/89, pulse 77, temperature 99.6 F (37.6 C), temperature source Oral, resp. rate (!) 21, height 5\' 4"  (1.626 m), weight 56.5 kg, SpO2 100 %.    Vent Mode: PCV FiO2 (%):  [40 %-60 %] 60 % Set Rate:  [16 bmp] 16 bmp PEEP:  [5 cmH20-7 cmH20] 5 cmH20 Plateau Pressure:  [22 cmH20-23 cmH20] 22 cmH20   Intake/Output Summary (Last 24 hours) at 06/03/2020 1047 Last data filed at 06/03/2020 0900 Gross per 24 hour  Intake 70 ml  Output 250 ml  Net -180 ml   Filed Weights   06/03/20 0550  Weight: 56.5 kg    Examination: General: chronically ill appearing, trach to vent, a little lethargic HEENT: #6 [place] cuffed trach midline, minimal secretions Neuro: resting comfortably, arouses to voice and follows simple commands CV: s1s2 regular rate and rhythm, no murmur, rubs, or gallops,  PULM: diminished air entry, no crackles, no increased wob GI: soft, nontender, PEG present Extremities: thin no edema   Resolved Hospital Problem list     Assessment & Plan:  Acute Hypoxic and hypercapnic Respiratory Failure  Severe COPD s/p tracheostomy and home ventilation - possible malfunction of home ventilator and possible recurrent VAP P: Continue ventilator support with lung protective strategies  Wean PEEP and FiO2 for sats greater than 90%. Head of bed elevated 30 degrees. Plateau pressures less than 30 cm H20.  Follow intermittent chest x-ray and ABG.   SAT/SBT as tolerated, mentation preclude extubation  Ensure adequate pulmonary hygiene  Pan culture VAP bundle in place.  PAD protocol  Severe sepsis - possible VAP P: Continue cefepime. mrsa pcr negative so will stop vancomycin Vent support as above IV  hydration MAP goal greater than 65 Trend lactic acid Procalcitonin 0.45. will send a repeat for tomorrow.   Transaminitis -LFTs elevated on admission P: Avoid hepatotoxins  Trend LFTs Closely monitor LFTs in the setting of chronic antiepileptic medications  History of hypertension P: Closely monitor hemodynamics in the ICU setting As needed IV hydralazine as needed. Will resume home norvasc Continuous telemetry  At risk malnutrition P: Continue tube feeds through PEG Supplement protein   Best practice (evaluated daily)  Diet: Tube feeds resumed through PEG Pain/Anxiety/Delirium protocol (if indicated): In place VAP protocol (if indicated): In place DVT prophylaxis: Subcu heparin GI prophylaxis: PPI Glucose control: Monitor Mobility: Bedrest Disposition: ICU  Goals of Care:  Last date of multidisciplinary goals of care discussion: Pending Family and staff present: Pending Summary of discussion: Pending Follow up goals of care discussion due: Pending Code Status: Full  The patient is critically ill due to respiratory failure.  They require high complexity decision making for assessment and support, frequent evaluation and titration of therapies, application of advanced monitoring technologies and extensive interpretation of multiple databases.   Critical Care Time devoted to patient care services described in this note is 31 minutes. This time reflects time of care of this signee Charlott Holler . This critical care time does not reflect separately billable procedures or procedure time, teaching time or supervisory time of PA/NP/Med student/Med Resident etc but could involve care discussion time.  Charlott Holler [facility] 06/03/2020 10:47 AM  Pager: see AMION After hours pager: [phone]  If no response to pager , please call [phone] until 7pm After 7:00 pm call Elink  [phone]     Labs   CBC: Recent Labs  Lab  06/03/20 0036 06/03/20 0052 06/03/20 0340  WBC 24.2*  --  19.1*  HGB 10.3* 11.2* 10.0*  HCT 35.1* 33.0* 32.5*  MCV 97.0  --  94.2  PLT 326  --  284    Basic Metabolic Panel: Recent Labs  Lab 06/03/20 0036 06/03/20 0052 06/03/20 0340  NA 139 139  --   K 4.4 4.4  --   CL 98  --   --   CO2 28  --   --   GLUCOSE 158*  --   --   BUN 41*  --   --   CREATININE 0.60  --  0.50  CALCIUM 9.1  --   --   MG  --   --  2.4  PHOS  --   --  4.9*   GFR: Estimated Creatinine Clearance: 67.8 mL/min (by C-G formula based on SCr of 0.5 mg/dL). Recent Labs  Lab 06/03/20 0035 06/03/20 0036 06/03/20 0340  PROCALCITON  --   --  0.45  WBC  --  24.2* 19.1*  LATICACIDVEN 2.3*  --   --     Liver Function Tests: Recent Labs  Lab 06/03/20 0036  AST 149*  ALT 88*  ALKPHOS 163*  BILITOT 0.7  PROT 6.1*  ALBUMIN 2.7*   No results for input(s): LIPASE, AMYLASE in the last 168 hours. No results for input(s): AMMONIA in the last 168 hours.  ABG    Component Value Date/Time   PHART 7.495 (H) 05/19/2020 1055   PCO2ART 47.1 05/19/2020 1055  PO2ART 155 (H) 05/19/2020 1055   HCO3 33.0 (H) 06/03/2020 0052   TCO2 35 (H) 06/03/2020 0052   O2SAT 99.0 06/03/2020 0052     Coagulation Profile: No results for input(s): INR, PROTIME in the last 168 hours.  Cardiac Enzymes: No results for input(s): CKTOTAL, CKMB, CKMBINDEX, TROPONINI in the last 168 hours.  HbA1C: Hgb A1c MFr Bld  Date/Time Value Ref Range Status  02/26/2020 05:18 PM 5.4 4.8 - 5.6 % Final    Comment:    (NOTE) Pre diabetes:          5.7%-6.4%  Diabetes:              >6.4%  Glycemic control for   <7.0% adults with diabetes   12/22/2018 04:37 AM 5.5 4.8 - 5.6 % Final    Comment:    (NOTE) Pre diabetes:          5.7%-6.4% Diabetes:              >6.4% Glycemic control for   <7.0% adults with diabetes     CBG: Recent Labs  Lab 06/03/20 0402 06/03/20 0534 06/03/20 0719  GLUCAP 124* 112* 92

## 2020-06-03 NOTE — H&P (Signed)
NAME:  Kathleen Cameron, MRN:  [id number], DOB:  06-04-1965, LOS: 0 ADMISSION DATE:  06/03/2020, CONSULTATION DATE:  06/02/2020 REFERRING MD:  Dr. Sedonia Small, CHIEF COMPLAINT: Acute respiratory distress  Brief History:  55 year old female presented from home via EMS with complaints of acute respiratory distress.  [facility]  PCCM consulted for further management and admission  History of Present Illness:  Kathleen Cameron is a 55 y.o. with a past medical history significant for seizure disorder, hypertension, severe COPD, acute on chronic hypoxic and hypercapnic respiratory failure, and anxiety who presented to the ED via EMS with acute on chronic hypoxic and hypercapnic respiratory failure.  Of note [facility] prior to [facility].  Primary history obtained from chart review as patient is currently on ventilator through chronic trach.  It appears patient was treated at this facility November 2021 for acute on chronic hypoxic respiratory failure she required endotracheal intubation and eventual tracheostomy due to difficulty weaning 03/02/2020.  On 11/18 [facility]  Upon discharge home per chart review it appears patient's home ventilator malfunction much with resulted in respiratory failure and need for bag mask ventilation.  On arrival to ED patient was placed on ventilator with home vent settings with no further acute respiratory compromise.  PCCM was called for further management and admission.   Past Medical History:  Seizure disorder Hypertension Severe COPD Acute on chronic hypoxic and hypercapnic respiratory failure Anxiety  Significant Hospital Events:  Admitted 2/13  Consults:    Procedures:    Significant Diagnostic Tests:  Chest x-ray 2/13 > worsening left perihilar airspace disease with increased patchy opacities and bronchial thickening suspicious for worsening  pneumonia  Micro Data:  Covid 2/13 > Blood culture 2/13 > Sputum culture 2/13 > MRSA PCR 2/13 >  Antimicrobials:  Cefepime 2/13 > Vancomycin 2/13 >  Interim History / Subjective:  As above  Objective   Blood pressure (!) 162/91, pulse (!) 114, temperature 98.4 F (36.9 C), temperature source Temporal, resp. rate (!) 25, SpO2 (!) 85 %.    Vent Mode: PCV FiO2 (%):  [40 %] 40 % Set Rate:  [16 bmp] 16 bmp PEEP:  [7 cmH20] 7 cmH20 Plateau Pressure:  [23 cmH20] 23 cmH20  No intake or output data in the 24 hours ending 06/03/20 0244 There were no vitals filed for this visit.  Examination: General: Chronically ill appearing very deconditioned middle-aged female lying in bed on mechanical ventilation through chronic trach in no acute distress  HEENT: #6 [place] cuffed trach midline, MM pink/moist, PERRL,  Neuro: Alert and interactive on ventilator, able to follow simple commands CV: s1s2 regular rate and rhythm, no murmur, rubs, or gallops,  PULM: Diminished air entry bilaterally, faint expiratory wheeze, very mild accessory muscle use, tolerating ventilator GI: soft, bowel sounds active in all 4 quadrants, non-tender, non-distended Extremities: warm/dry, no edema  Skin: no rashes or lesions   Resolved Hospital Problem list     Assessment & Plan:  Acute Hypoxic and hypercapnic Respiratory Failure  -Multifactorial including malfunction of home ventilator and possible recurrent VAP P: Continue ventilator support with lung protective strategies  Wean PEEP and FiO2 for sats greater than 90%. Head of bed elevated 30 degrees. Plateau pressures less than 30 cm H20.  Follow intermittent chest x-ray and ABG.   SAT/SBT as tolerated, mentation preclude extubation  Ensure adequate pulmonary hygiene  Pan culture VAP bundle in place  PAD protocol Broad-spectrum  antibiotics  Severe sepsis -Patient presented tachypneic, tachycardic, hypertensive, evidence of leukocytosis, and elevated  lactic acidosis of 2.3 meeting criteria for severe sepsis -Likely secondary to recurrent with VAP P: Admit ICU Broad-spectrum antibiotics Vent support as above Pan cultures prior to antibiotic IV antibiotics vanc and cefepime IV hydration MAP goal greater than 65 Trend lactic acid Procalcitonin Monitor urine output  Transaminitis -LFTs elevated on admission P: Avoid hepatotoxins  Trend LFTs Closely monitor LFTs in the setting of chronic antiepileptic medications  History of hypertension P: Closely monitor hemodynamics in the ICU setting As needed IV hydralazine as needed Continuous telemetry  At risk malnutrition P: Continue tube feeds through PEG Supplement protein   Best practice (evaluated daily)  Diet: Tube feeds Pain/Anxiety/Delirium protocol (if indicated): In place VAP protocol (if indicated): In place DVT prophylaxis: Subcu heparin GI prophylaxis: PPI Glucose control: Monitor Mobility: Bedrest Disposition: ICU  Goals of Care:  Last date of multidisciplinary goals of care discussion: Pending Family and staff present: Pending Summary of discussion: Pending Follow up goals of care discussion due: Pending Code Status: Full  Labs   CBC: Recent Labs  Lab 05/27/20 0635 06/03/20 0036 06/03/20 0052  WBC 8.6 24.2*  --   HGB 9.0* 10.3* 11.2*  HCT 29.4* 35.1* 33.0*  MCV 95.8 97.0  --   PLT 189 326  --     Basic Metabolic Panel: Recent Labs  Lab 05/27/20 0635 06/03/20 0036 06/03/20 0052  NA 140 139 139  K 4.7 4.4 4.4  CL 98 98  --   CO2 32 28  --   GLUCOSE 138* 158*  --   BUN 32* 41*  --   CREATININE 0.41* 0.60  --   CALCIUM 9.4 9.1  --    GFR: CrCl cannot be calculated (Unknown ideal weight.). Recent Labs  Lab 05/27/20 0635 06/03/20 0035 06/03/20 0036  WBC 8.6  --  24.2*  LATICACIDVEN  --  2.3*  --     Liver Function Tests: Recent Labs  Lab 06/03/20 0036  AST 149*  ALT 88*  ALKPHOS 163*  BILITOT 0.7  PROT 6.1*  ALBUMIN  2.7*   No results for input(s): LIPASE, AMYLASE in the last 168 hours. No results for input(s): AMMONIA in the last 168 hours.  ABG    Component Value Date/Time   PHART 7.495 (H) 05/19/2020 1055   PCO2ART 47.1 05/19/2020 1055   PO2ART 155 (H) 05/19/2020 1055   HCO3 33.0 (H) 06/03/2020 0052   TCO2 35 (H) 06/03/2020 0052   O2SAT 99.0 06/03/2020 0052     Coagulation Profile: No results for input(s): INR, PROTIME in the last 168 hours.  Cardiac Enzymes: No results for input(s): CKTOTAL, CKMB, CKMBINDEX, TROPONINI in the last 168 hours.  HbA1C: Hgb A1c MFr Bld  Date/Time Value Ref Range Status  02/26/2020 05:18 PM 5.4 4.8 - 5.6 % Final    Comment:    (NOTE) Pre diabetes:          5.7%-6.4%  Diabetes:              >6.4%  Glycemic control for   <7.0% adults with diabetes   12/22/2018 04:37 AM 5.5 4.8 - 5.6 % Final    Comment:    (NOTE) Pre diabetes:          5.7%-6.4% Diabetes:              >6.4% Glycemic control for   <7.0% adults with diabetes     CBG:  No results for input(s): GLUCAP in the last 168 hours.  Review of Systems:   Unable to gather due to intubation  Past Medical History:  She,  has a past medical history of Acute on chronic respiratory failure with hypoxia ([place]), Anxiety disorder, Asthma, COPD (chronic obstructive pulmonary disease) ([place]), COPD, severe ([place]), Hypertension, and Seizure disorder ([place]).   Surgical History:   Past Surgical History:  Procedure Laterality Date  . IR FLUORO RM 30-60 MIN  03/23/2020  . LAPAROSCOPIC INSERTION GASTROSTOMY TUBE N/A 04/24/2020   Procedure: LAPAROSCOPIC GASTROSTOMY TUBE PLACEMENT;  Surgeon: Kieth Brightly Arta Bruce, MD;  Location: [facility];  Service: General;  Laterality: N/A;  . WISDOM TOOTH EXTRACTION       Social History:   reports that she has never smoked. She has never used smokeless tobacco. She reports current alcohol use of about 2.0 standard drinks of alcohol per week. She reports that she does not use  drugs.   Family History:  Her Family history is unknown by patient.   Allergies Allergies  Allergen Reactions  . Stiolto Respimat [Tiotropium Bromide-Olodaterol] Other (See Comments)    Severe headaches and rapid heartrate     Home Medications  Prior to Admission medications   Medication Sig Start Date End Date Taking? Authorizing Provider  amLODipine (NORVASC) 5 MG tablet Place 2 tablets (10 mg total) into feeding tube daily. 03/09/20   Candee Furbish, MD  arformoterol (BROVANA) 15 MCG/2ML NEBU Take 2 mLs (15 mcg total) by nebulization 2 (two) times daily. 03/08/20   Candee Furbish, MD  aspirin 81 MG chewable tablet Place 1 tablet (81 mg total) into feeding tube daily. 03/09/20   Candee Furbish, MD  bisacodyl (DULCOLAX) 10 MG suppository Place 1 suppository (10 mg total) rectally daily as needed for severe constipation. 03/08/20   Candee Furbish, MD  budesonide (PULMICORT) 0.25 MG/2ML nebulizer solution Take 2 mLs (0.25 mg total) by nebulization 2 (two) times daily. 03/08/20   Candee Furbish, MD  Chlorhexidine Gluconate Cloth 2 % PADS Apply 6 each topically daily. 03/09/20   Candee Furbish, MD  chlorhexidine gluconate, MEDLINE KIT, (PERIDEX) 0.12 % solution 15 mLs by Mouth Rinse route 2 (two) times daily. 03/08/20   Candee Furbish, MD  clonazePAM (KLONOPIN) 1 MG tablet Place 1 tablet (1 mg total) into feeding tube 3 (three) times daily. 03/08/20   Candee Furbish, MD  enoxaparin (LOVENOX) 40 MG/0.4ML injection Inject 0.4 mLs (40 mg total) into the skin daily. 03/09/20   Candee Furbish, MD  famotidine (PEPCID) 40 MG/5ML suspension Place 2.5 mLs (20 mg total) into feeding tube 2 (two) times daily. 03/08/20   Candee Furbish, MD  fentaNYL (SUBLIMAZE) 100 MCG/2ML injection Inject 0.5 mLs (25 mcg total) into the vein every 2 (two) hours as needed for severe pain. 03/08/20   Candee Furbish, MD  levothyroxine (SYNTHROID) 50 MCG tablet Take 1 tablet (50 mcg total) by mouth daily at 6  (six) AM. 02/03/20   Charlynne Cousins, MD  lip balm (CARMEX) ointment Apply topically as needed for lip care. 03/08/20   Candee Furbish, MD  metoCLOPramide (REGLAN) 10 MG tablet Place 1 tablet (10 mg total) into feeding tube every 8 (eight) hours. 03/08/20   Candee Furbish, MD  midazolam (VERSED) 2 MG/2ML SOLN injection Inject 2 mLs (2 mg total) into the vein every 2 (two) hours as needed for sedation. 03/08/20   Candee Furbish, MD  Nutritional  Supplements (FEEDING SUPPLEMENT, OSMOLITE 1.2 CAL,) LIQD Place 1,000 mLs into feeding tube continuous. 03/08/20   Candee Furbish, MD  pantoprazole sodium (PROTONIX) 40 mg/20 mL PACK Place 20 mLs (40 mg total) into feeding tube at bedtime. 03/08/20   Candee Furbish, MD  phenol (CHLORASEPTIC) 1.4 % LIQD Use as directed 1 spray in the mouth or throat as needed for throat irritation / pain. 03/08/20   Candee Furbish, MD  polyethylene glycol (MIRALAX / GLYCOLAX) 17 g packet Place 17 g into feeding tube daily as needed for mild constipation. 03/08/20   Candee Furbish, MD  prednisoLONE acetate (PRED FORTE) 1 % ophthalmic suspension Place 1 drop into the left eye 4 (four) times daily. 03/08/20   Candee Furbish, MD  revefenacin (YUPELRI) 175 MCG/3ML nebulizer solution Take 3 mLs (175 mcg total) by nebulization daily. 03/09/20   Candee Furbish, MD  rosuvastatin (CRESTOR) 40 MG tablet Place 1 tablet (40 mg total) into feeding tube daily. 03/08/20   Candee Furbish, MD  senna-docusate (SENOKOT-S) 8.6-50 MG tablet Place 2 tablets into feeding tube at bedtime as needed for moderate constipation. 03/08/20   Candee Furbish, MD  sodium chloride (OCEAN) 0.65 % SOLN nasal spray Place 1 spray into both nostrils as needed for congestion. 03/08/20   Candee Furbish, MD  temazepam (RESTORIL) 15 MG capsule Place 1 capsule (15 mg total) into feeding tube at bedtime. 03/08/20   Candee Furbish, MD  venlafaxine [facility]) 37.5 MG tablet Place 1 tablet (37.5 mg total) into  feeding tube 2 (two) times daily with a meal. 03/08/20   Candee Furbish, MD     Critical care time:    Performed by: Johnsie Cancel  Total critical care time: 40 minutes  Critical care time was exclusive of separately billable procedures and treating other patients.  Critical care was necessary to treat or prevent imminent or life-threatening deterioration.  Critical care was time spent personally by me on the following activities: development of treatment plan with patient and/or surrogate as well as nursing, discussions with consultants, evaluation of patient's response to treatment, examination of patient, obtaining history from patient or surrogate, ordering and performing treatments and interventions, ordering and review of laboratory studies, ordering and review of radiographic studies, pulse oximetry and re-evaluation of patient's condition.  Johnsie Cancel, NP-C [facility] Personal contact information can be found on Amion  If no response please page: Adult pulmonary and critical care medicine pager on Amion unitl 7pm After 7pm please call [phone] 06/03/2020, 3:41 AM

## 2020-06-03 NOTE — Progress Notes (Signed)
Pharmacy Antibiotic Note  Tayten Heber is a 55 y.o. female admitted on 06/03/2020 with respiratory failure, possible VAP.  Pharmacy has been consulted for Vancomycin and Cefepime  dosing.  Plan: Vancomycin 1000 mg IV now, then 500 mg IV q12h Cefepime 2 g IV q8h     Temp (24hrs), Avg:98.4 F (36.9 C), Min:98.4 F (36.9 C), Max:98.4 F (36.9 C)  Recent Labs  Lab 05/27/20 0635 06/03/20 0035 06/03/20 0036  WBC 8.6  --  24.2*  CREATININE 0.41*  --  0.60  LATICACIDVEN  --  2.3*  --     CrCl cannot be calculated (Unknown ideal weight.).    Allergies  Allergen Reactions  . Stiolto Respimat [Tiotropium Bromide-Olodaterol] Other (See Comments)    Severe headaches and rapid heartrate     Eddie Candle 06/03/2020 3:33 AM

## 2020-06-03 NOTE — Progress Notes (Signed)
Changed vent settings to [facility] as documented per Dr. Celine Mans. Pt seems to be more comfortable on these settings. I was unable to confirm her home vent settings with family.

## 2020-06-03 NOTE — Progress Notes (Signed)
eLink Physician-Brief Progress Note Patient Name: Kathleen Cameron DOB: 1965-06-08 MRN: [id number]   Date of Service  06/03/2020  HPI/Events of Note  New patient evaluation. 1 F with hx of seizure d/o, severe COPD, chronic hypoxic and hypercapnic respiratory failure s/p tracheostomy who was recently discharged home from [facility] (2 days PTA) with home ventilator.  At home, husband reports the ventilator started alarming then she lost consciousness causing him to start bag ventilation.  Here, patient has been afebrile and normotensive. Initial VBG 7.33/63/154/33/BE +5 on PCV 22/7, 16, 40% (though now on 60% FiO2). Receiving volumes of ~450-480cc (~8cc/kg).  WBC 24k, lactate 2.3. BMP wnl.  CXR shows worsening left perihilar airspace disease with increasing patchy opacities c/f pneumonia per radiology. Hyperinflated lungs.   eICU Interventions  # Respiratory: - Unclear cause of ventilator issues at home but patient appears to be ventilating ok here. - Possible the patient has a VAP. Does have WBC 24k but CXR is somewhat underwhelming for this and she is afebrile. - Will cover with empiric antibiotics for VAP for now: vanc/cefepime. - F/u respiratory culture. - Continue PCV 22/7, 16, 60% - weaning FiO2 as tolerated.  # Cardiac: - NAI  # GI: - NPO for now. Likely resume tube feeds later today.   # Renal: - NAI  DVT PPX: Heparin [place] PPX: Famotidine IV.     Intervention Category Evaluation Type: New Patient Evaluation  Kathleen Cameron 06/03/2020, 5:20 AM

## 2020-06-03 NOTE — Progress Notes (Signed)
Per CCM MD, ok to use peg/start TF

## 2020-06-03 NOTE — ED Triage Notes (Addendum)
Patient arrived with EMS from home family reported worsening SOB this evening , she was recently discharged yesterday afternoon , received 2 doses of Duoneb treatment , Magnesium IV 2 grams and Solumedrol 125 mg prior to arrival. RT applied ventilator at arrival.

## 2020-06-03 NOTE — Progress Notes (Signed)
eLink Physician-Brief Progress Note Patient Name: Kathleen Cameron DOB: Mar 28, 1966 MRN: [id number]   Date of Service  06/03/2020  HPI/Events of Note  SBP 150, was at > 160. Discussed with bed side RN. No pain. Has prn fenta. Got Norvasc at noon.  Has a peg.Cr normal.  HR 120.   AHRF. On 40% on Vent. Trach -chronic.     eICU Interventions  - metoprolol low dose via peg once prn for SBP > 160.      Intervention Category Intermediate Interventions: Hypertension - evaluation and management  [name] 06/03/2020, 9:32 PM

## 2020-06-04 DIAGNOSIS — J449 Chronic obstructive pulmonary disease, unspecified: Secondary | ICD-10-CM | POA: Diagnosis not present

## 2020-06-04 DIAGNOSIS — Z9911 Dependence on respirator [ventilator] status: Secondary | ICD-10-CM | POA: Diagnosis not present

## 2020-06-04 DIAGNOSIS — J9621 Acute and chronic respiratory failure with hypoxia: Secondary | ICD-10-CM | POA: Diagnosis not present

## 2020-06-04 DIAGNOSIS — J9622 Acute and chronic respiratory failure with hypercapnia: Secondary | ICD-10-CM | POA: Diagnosis not present

## 2020-06-04 LAB — PROCALCITONIN: Procalcitonin: 0.98 ng/mL

## 2020-06-04 LAB — BLOOD CULTURE ID PANEL (REFLEXED) - BCID2

## 2020-06-04 LAB — GLUCOSE, CAPILLARY
Glucose-Capillary: 123 mg/dL — ABNORMAL HIGH (ref 70–99)
Glucose-Capillary: 132 mg/dL — ABNORMAL HIGH (ref 70–99)
Glucose-Capillary: 139 mg/dL — ABNORMAL HIGH (ref 70–99)
Glucose-Capillary: 144 mg/dL — ABNORMAL HIGH (ref 70–99)
Glucose-Capillary: 114 mg/dL — ABNORMAL HIGH (ref 70–99)
Glucose-Capillary: 141 mg/dL — ABNORMAL HIGH (ref 70–99)

## 2020-06-04 LAB — MAGNESIUM: Magnesium: 2 mg/dL (ref 1.7–2.4)

## 2020-06-04 LAB — PHOSPHORUS: Phosphorus: 3.4 mg/dL (ref 2.5–4.6)

## 2020-06-04 MED ORDER — ENOXAPARIN SODIUM 40 MG/0.4ML ~~LOC~~ SOLN
40.0000 mg | SUBCUTANEOUS | Status: DC
  Administered 2020-06-04 – 2020-06-12 (×9): 40 mg via SUBCUTANEOUS
  Filled 2020-06-04 (×9): qty 0.4

## 2020-06-04 MED ORDER — VITAL AF 1.2 CAL PO LIQD
1000.0000 mL | ORAL | Status: DC
  Administered 2020-06-04 – 2020-06-06 (×3): 1000 mL

## 2020-06-04 MED ORDER — ARFORMOTEROL TARTRATE 15 MCG/2ML IN NEBU
15.0000 ug | INHALATION_SOLUTION | Freq: Two times a day (BID) | RESPIRATORY_TRACT | Status: DC
  Administered 2020-06-04 – 2020-07-02 (×53): 15 ug via RESPIRATORY_TRACT
  Filled 2020-06-04 (×58): qty 2

## 2020-06-04 MED ORDER — REVEFENACIN 175 MCG/3ML IN SOLN
175.0000 ug | Freq: Every day | RESPIRATORY_TRACT | Status: DC
  Administered 2020-06-06 – 2020-07-01 (×23): 175 ug via RESPIRATORY_TRACT
  Filled 2020-06-04 (×30): qty 3

## 2020-06-04 MED ORDER — ADULT MULTIVITAMIN W/MINERALS CH
1.0000 | ORAL_TABLET | Freq: Every day | ORAL | Status: DC
  Administered 2020-06-04 – 2020-07-02 (×29): 1
  Filled 2020-06-04 (×29): qty 1

## 2020-06-04 MED ORDER — CHLORHEXIDINE GLUCONATE 0.12 % MT SOLN
OROMUCOSAL | Status: AC
  Filled 2020-06-04: qty 15

## 2020-06-04 NOTE — Progress Notes (Signed)
NAME:  Kathleen Cameron, MRN:  [id number], DOB:  [birth year], LOS: 1 ADMISSION DATE:  06/03/2020, CONSULTATION DATE:  06/02/2020 REFERRING MD:  Dr. Pilar Plate, CHIEF COMPLAINT: Acute respiratory distress  Brief History:  55 year old female presented from home via EMS with complaints of acute respiratory distress.  [facility].  PCCM consulted for further management and admission  History of Present Illness:  Kathleen Cameron is a 55 y.o. with a past medical history significant for seizure disorder, hypertension, severe COPD, acute on chronic hypoxic and hypercapnic respiratory failure, and anxiety who presented to the ED via EMS with acute on chronic hypoxic and hypercapnic respiratory failure.  Of note patient was recently [facility] 2 days prior to admission.  Primary history obtained from chart review as patient is currently on ventilator through chronic trach.  It appears patient was treated at this facility November 2021 for acute on chronic hypoxic respiratory failure she required endotracheal intubation and eventual tracheostomy due to difficulty weaning 03/02/2020.  On 11/18 [facility]  Upon discharge home per chart review it appears patient's home ventilator malfunction much with resulted in respiratory failure and need for bag mask ventilation.  On arrival to ED patient was placed on ventilator with home vent settings with no further acute respiratory compromise.  PCCM was called for further management and admission.   Past Medical History:  Seizure disorder Hypertension Severe COPD Acute on chronic hypoxic and hypercapnic respiratory failure Anxiety  Significant Hospital Events:  Admitted 2/13  Consults:    Procedures:    Significant Diagnostic Tests:  Chest x-ray 2/13 > worsening left perihilar airspace disease with increased patchy opacities and bronchial thickening suspicious for worsening  pneumonia  Micro Data:  Covid 2/13 > Blood culture 2/13 > Sputum culture 2/13 > MRSA PCR 2/13 >  Antimicrobials:  Cefepime 2/13 > Vancomycin 2/13 >  Interim History / Subjective:  Ms. Thad Ranger denies complaints.   Objective   Blood pressure (!) 133/93, pulse (!) 101, temperature (!) 97.5 F (36.4 C), temperature source Oral, resp. rate (!) 25, height 5\' 4"  (1.626 m), weight 57 kg, SpO2 94 %.    Vent Mode: PRVC FiO2 (%):  [40 %-60 %] 40 % Set Rate:  [16 bmp] 16 bmp Vt Set:  [420 mL] 420 mL PEEP:  [5 cmH20] 5 cmH20 Plateau Pressure:  [22 cmH20-29 cmH20] 22 cmH20   Intake/Output Summary (Last 24 hours) at 06/04/2020 1103 Last data filed at 06/04/2020 1000 Gross per 24 hour  Intake 1507.39 ml  Output 1500 ml  Net 7.39 ml   Filed Weights   06/03/20 0550 06/04/20 0401  Weight: 56.5 kg 57 kg    Examination: General: chronically ill appearing woman laying in bed trached on MV HEENT: [place]/AT, oral mucosa moist Neck: Shiley #6 cuffed trach, no secretions Neuro: sleeping, arousable to stimulation CV: S1S2, RRR PULM: Synchronous with MV, tan trach secretions. Diminished bilateral chest tubes. GI: soft, NT Extremities: no cyanosis or edema   Resolved Hospital Problem list     Assessment & Plan:   -- needs home vent brought in; need to trouble shoot what happened  Acute on chronic hypoxic and hypercapnic respiratory failure due to COPD, vent dependetn. Discharged to home from [facility] to go home with chronic MV. Home vent malfunctioned (totally turned off- confirmed by home respiratory company). Now admitted with likely pneumonia. -Husband at bedside- he, son, daughter were all trained for home vent and trach care before she  left [facility]. Husband indicated she needs a new ambu bag and wants to take hers home when she leaves. New vent has been brought to the house. -Discussed with RNs- family needs to continue helping with her trach care to maintain what they learned from  [facility] -con't broad spectrum antibiotics- once we have C&S results, can send her out on an appropriate antibiotic, hopefully an oral agent. -con't lung protective vent strategies -con't nebulized steroids (apparently had intolerance to Stiolto); trial of adding brovana and yupelri -VAP prevention protocol  Severe sepsis, likely VAP. Unfortunately PCT is rising, not falling. -con't to follow cultures until finalized -Continue cefepime  Transaminase elevation, likely 2/2 sepsis -LFTs elevated on admission -avoid hepatotoxic agents -recheck LFTs tomorrow  History of hypertension -IV hydralazine as needed -con't PTA amlodipine Continuous telemetry  Hypothyroidism -con't synthroid  At risk malnutrition -Continue tube feeds through PEG   Husband updated at bedside. Hopefully can go home in the coming days when her home vent and respiratory supplies have been confirmed and antibiotic needs are known.   Best practice (evaluated daily)  Diet: Tube feeds resumed through PEG Pain/Anxiety/Delirium protocol (if indicated): In place VAP protocol (if indicated): In place DVT prophylaxis: Subcu heparin GI prophylaxis: PPI Glucose control: Monitor Mobility: Bedrest Disposition: ICU  Goals of Care:  Last date of multidisciplinary goals of care discussion: Pending Family and staff present: Pending Summary of discussion: Pending Follow up goals of care discussion due: Pending Code Status: Full    Labs   CBC: Recent Labs  Lab 06/03/20 0036 06/03/20 0052 06/03/20 0340  WBC 24.2*  --  19.1*  HGB 10.3* 11.2* 10.0*  HCT 35.1* 33.0* 32.5*  MCV 97.0  --  94.2  PLT 326  --  284    Basic Metabolic Panel: Recent Labs  Lab 06/03/20 0036 06/03/20 0052 06/03/20 0340 06/03/20 1751 06/04/20 0323  NA 139 139  --   --   --   K 4.4 4.4  --   --   --   CL 98  --   --   --   --   CO2 28  --   --   --   --   GLUCOSE 158*  --   --   --   --   BUN 41*  --   --   --   --   CREATININE  0.60  --  0.50  --   --   CALCIUM 9.1  --   --   --   --   MG  --   --  2.4 2.1 2.0  PHOS  --   --  4.9* 4.2 3.4   GFR: Estimated Creatinine Clearance: 67.8 mL/min (by C-G formula based on SCr of 0.5 mg/dL). Recent Labs  Lab 06/03/20 0035 06/03/20 0036 06/03/20 0340 06/04/20 0323  PROCALCITON  --   --  0.45 0.98  WBC  --  24.2* 19.1*  --   LATICACIDVEN 2.3*  --   --   --     Liver Function Tests: Recent Labs  Lab 06/03/20 0036  AST 149*  ALT 88*  ALKPHOS 163*  BILITOT 0.7  PROT 6.1*  ALBUMIN 2.7*   No results for input(s): LIPASE, AMYLASE in the last 168 hours. No results for input(s): AMMONIA in the last 168 hours.  ABG    Component Value Date/Time   PHART 7.495 (H) 05/19/2020 1055   PCO2ART 47.1 05/19/2020 1055   PO2ART 155 (H) 05/19/2020 1055  HCO3 33.0 (H) 06/03/2020 0052   TCO2 35 (H) 06/03/2020 0052   O2SAT 99.0 06/03/2020 0052     Coagulation Profile: No results for input(s): INR, PROTIME in the last 168 hours.  Cardiac Enzymes: No results for input(s): CKTOTAL, CKMB, CKMBINDEX, TROPONINI in the last 168 hours.  HbA1C: Hgb A1c MFr Bld  Date/Time Value Ref Range Status  02/26/2020 05:18 PM 5.4 4.8 - 5.6 % Final    Comment:    (NOTE) Pre diabetes:          5.7%-6.4%  Diabetes:              >6.4%  Glycemic control for   <7.0% adults with diabetes   12/22/2018 04:37 AM 5.5 4.8 - 5.6 % Final    Comment:    (NOTE) Pre diabetes:          5.7%-6.4% Diabetes:              >6.4% Glycemic control for   <7.0% adults with diabetes     CBG: Recent Labs  Lab 06/03/20 1641 06/03/20 1952 06/03/20 2347 06/04/20 0352 06/04/20 0747  GLUCAP 100* 90 103* 123* 132*    This patient is critically ill with multiple organ system failure which requires frequent high complexity decision making, assessment, support, evaluation, and titration of therapies. This was completed through the application of advanced monitoring technologies and extensive  interpretation of multiple databases. During this encounter critical care time was devoted to patient care services described in this note for 35 minutes.   Steffanie Dunn, DO 06/04/20 5:35 PM [facility]  From 7AM- 7PM if no response to pager, please call [phone]. After hours, 7PM- 7AM, please call Elink  [phone].

## 2020-06-04 NOTE — Progress Notes (Signed)
Initial Nutrition Assessment  DOCUMENTATION CODES:   Non-severe (moderate) malnutrition in context of chronic illness  INTERVENTION:   Initiate tube feeding via PEG: Change to Vital AF 1.2 at 50 ml/h (1200 ml per day)  Provides 1440 kcal, 90 gm protein, 973 ml free water daily  NUTRITION DIAGNOSIS:   Moderate Malnutrition related to chronic illness (COPD) as evidenced by mild muscle depletion,mild fat depletion.  GOAL:   Patient will meet greater than or equal to 90% of their needs  MONITOR:   Vent status,TF tolerance,Labs  REASON FOR ASSESSMENT:   Ventilator,Consult Enteral/tube feeding initiation and management  ASSESSMENT:   55 yo female admitted from home with ventilator malfunction. She was discharged home from [facility] and required re-admission to [facility] on the same day. PMH includes HTN, asthma, COPD, seizure disorder, anxiety disorder.   Discussed patient in ICU rounds and with RN today. Received MD Consult for TF initiation and management. PEG in place. Currently receiving Vital High Protein at 40 ml/h with Prosource TF 45 ml BID per tube feeding protocol.  Per discussion with husband at bedside, patient was receiving Osmolite 1.5 formula at [facility]. TF was running continuously. He is interested in bolus regimen when she is discharged home. She does not take anything by mouth.  Patient is currently intubated on ventilator support MV: 8.1 L/min Temp (24hrs), Avg:98 F (36.7 C), Min:97.5 F (36.4 C), Max:98.5 F (36.9 C)   Labs reviewed.  CBG: [phone]  Medications reviewed and include Pepcid.  Weight history reviewed. Patient's weight has been stable since 04/10/20 at 57 kg, unless weights have just been carried over without new weight being measured.   Patient has mild-moderate depletion of muscle and subcutaneous fat mass, meeting criteria for moderate malnutrition.   NUTRITION - FOCUSED PHYSICAL EXAM:  Flowsheet Row Most Recent Value  Orbital  Region Mild depletion  Upper Arm Region No depletion  Thoracic and Lumbar Region Moderate depletion  Buccal Region Mild depletion  Temple Region Mild depletion  Clavicle Bone Region Mild depletion  Clavicle and Acromion Bone Region Mild depletion  Scapular Bone Region Mild depletion  Dorsal Hand No depletion  Patellar Region Mild depletion  Anterior Thigh Region Mild depletion  Posterior Calf Region Moderate depletion  Edema (RD Assessment) Mild  Hair Reviewed  Eyes Reviewed  Mouth Reviewed  Skin Reviewed  Nails Reviewed       Diet Order:   Diet Order            Diet NPO time specified  Diet effective now                 EDUCATION NEEDS:   Not appropriate for education at this time  Skin:  Skin Assessment: Skin Integrity Issues: Skin Integrity Issues:: Stage II Stage II: R buttocks, R ankle  Last BM:  2/13 type 6  Height:   Ht Readings from Last 1 Encounters:  06/03/20 5\' 4"  (1.626 m)    Weight:   Wt Readings from Last 1 Encounters:  06/04/20 57 kg    BMI:  Body mass index is 21.57 kg/m.  Estimated Nutritional Needs:   Kcal:  1320  Protein:  85-95 gm  Fluid:  >/= 1.5 L    06/06/20, RD, LDN, CNSC Please refer to Amion for contact information.

## 2020-06-04 NOTE — Progress Notes (Signed)
   06/04/20 0733  Tracheostomy Shiley Legacy 6 mm Cuffed  Placement Date/Time: 06/03/20 0039   Inserted prior to hospital arrival?: Yes  Brand: Shiley Legacy  Size (mm): 6 mm  Style: Cuffed  Status Secured  Site Assessment Clean;Dry  Site Care Cleansed  Ties Assessment Secure  Cuff pressure (cm) 25 cm  Tracheostomy Equipment at bedside Yes and checklist posted at head of bed  Adult Ventilator Settings  Vent Type Servo U  Humidity HME  Vent Mode PRVC  Set Rate 16 bmp  FiO2 (%) 40 %  PEEP 5 cmH20  Vt Set 420 mL  Adult Ventilator Measurements  Peak Airway Pressure 28 L/min  Mean Airway Pressure 10 cmH20  Resp Rate Spontaneous 22 br/min  Resp Rate Total 16 br/min  Exhaled Vt 482 mL  Measured Ve 6.6 mL  SpO2 99 %  Auto PEEP 0 cmH20  Total PEEP 5 cmH20  I:E Ratio Measured 1:3  Adult Ventilator Alarms  Alarms On Y  Ve High Alarm 18 L/min  Ve Low Alarm 4 L/min  Resp Rate High Alarm 38 br/min  Resp Rate Low Alarm 8  PEEP Low Alarm 3 cmH2O  Press High Alarm 40 cmH2O  VAP Prevention  Cuff pressure (initial) 25 cm H2O  HOB> 30 Degrees Y  Equipment wiped down Yes  Pre-WUA / WUA Start  Pre WUA sedation dose No sedation - end of WUA  Breath Sounds  Bilateral Breath Sounds Diminished  Vent Respiratory Assessment  Respiratory Pattern Regular;Unlabored  Airway Suctioning/Secretions  Suction Type Tracheal  Suction Device  Catheter  Secretion Amount Small  Secretion Color Tan  Secretion Consistency Thick  Suction Tolerance Tolerated well  Suctioning Adverse Effects None  [place] Agitation Sedation Scale  [place] Agitation Sedation Scale (RASS) -1  RASS Goal 0

## 2020-06-04 NOTE — Progress Notes (Signed)
PHARMACY - PHYSICIAN COMMUNICATION CRITICAL VALUE ALERT - BLOOD CULTURE IDENTIFICATION (BCID)  Kathleen Cameron is an 55 y.o. female who presented to [facility] on 06/03/2020 with a chief complaint of acute respiratory distress   Name of physician (or Provider) Contacted: Dr. Roque Cash)  Current antibiotics: Cefepime  Changes to prescribed antibiotics recommended:  No changes  Results for orders placed or performed during the hospital encounter of 06/03/20  Blood Culture ID Panel (Reflexed) (Collected: 06/03/2020  3:50 AM)  Result Value Ref Range   Enterococcus faecalis NOT DETECTED NOT DETECTED   Enterococcus Faecium NOT DETECTED NOT DETECTED   Listeria monocytogenes NOT DETECTED NOT DETECTED   Staphylococcus species DETECTED (A) NOT DETECTED   Staphylococcus aureus (BCID) NOT DETECTED NOT DETECTED   Staphylococcus epidermidis NOT DETECTED NOT DETECTED   Staphylococcus lugdunensis NOT DETECTED NOT DETECTED   Streptococcus species NOT DETECTED NOT DETECTED   Streptococcus agalactiae NOT DETECTED NOT DETECTED   Streptococcus pneumoniae NOT DETECTED NOT DETECTED   Streptococcus pyogenes NOT DETECTED NOT DETECTED   A.calcoaceticus-baumannii NOT DETECTED NOT DETECTED   Bacteroides fragilis NOT DETECTED NOT DETECTED   Enterobacterales NOT DETECTED NOT DETECTED   Enterobacter cloacae complex NOT DETECTED NOT DETECTED   Escherichia coli NOT DETECTED NOT DETECTED   Klebsiella aerogenes NOT DETECTED NOT DETECTED   Klebsiella oxytoca NOT DETECTED NOT DETECTED   Klebsiella pneumoniae NOT DETECTED NOT DETECTED   Proteus species NOT DETECTED NOT DETECTED   Salmonella species NOT DETECTED NOT DETECTED   Serratia marcescens NOT DETECTED NOT DETECTED   Haemophilus influenzae NOT DETECTED NOT DETECTED   Neisseria meningitidis NOT DETECTED NOT DETECTED   Pseudomonas aeruginosa NOT DETECTED NOT DETECTED   Stenotrophomonas maltophilia NOT DETECTED NOT DETECTED   Candida albicans NOT  DETECTED NOT DETECTED   Candida auris NOT DETECTED NOT DETECTED   Candida glabrata NOT DETECTED NOT DETECTED   Candida krusei NOT DETECTED NOT DETECTED   Candida parapsilosis NOT DETECTED NOT DETECTED   Candida tropicalis NOT DETECTED NOT DETECTED   Cryptococcus neoformans/gattii NOT DETECTED NOT DETECTED    Abran Duke 06/04/2020  3:55 AM

## 2020-06-04 NOTE — Progress Notes (Incomplete)
NAME:  Kathleen Cameron, MRN:  [id number], DOB:  02-26-1966, LOS: 1 ADMISSION DATE:  06/03/2020, CONSULTATION DATE:  06/02/2020 REFERRING MD:  Dr. Pilar Plate, CHIEF COMPLAINT: Acute respiratory distress  Brief History:  55 year old female presented from home via EMS with complaints of acute respiratory distress.  [facility].  PCCM consulted for further management and admission   Kathleen Cameron is a 55 y.o. with a past medical history significant for seizure disorder, hypertension, severe COPD, acute on chronic hypoxic and hypercapnic respiratory failure, and anxiety who presented to the ED via EMS with acute on chronic hypoxic and hypercapnic respiratory failure.  Of note patient was recently [facility] days prior to admission.  Primary history obtained from chart review as patient is currently on ventilator through chronic trach.  It appears patient was treated at this facility November 2021 for acute on chronic hypoxic respiratory failure she required endotracheal intubation and eventual tracheostomy due to difficulty weaning 03/02/2020.  On 11/18 [facility] was [facility]  Upon discharge home per chart review it appears patient's home ventilator malfunction much with resulted in respiratory failure and need for bag mask ventilation.  On arrival to ED patient was placed on ventilator with home vent settings with no further acute respiratory compromise.  PCCM was called for further management and admission.   Past Medical History:  Seizure disorder Hypertension Severe COPD Acute on chronic hypoxic and hypercapnic respiratory failure Anxiety  Significant Hospital Events:  Admitted 2/13  Consults:    Procedures:    Significant Diagnostic Tests:  Chest x-ray 2/13 > worsening left perihilar airspace disease with increased patchy opacities and bronchial thickening suspicious for worsening pneumonia  Micro Data:  Covid  2/13 > Blood culture 2/13 > Sputum culture 2/13 > MRSA PCR 2/13 >  Antimicrobials:  Cefepime 2/13 > Vancomycin 2/13 >  Interim History / Subjective:   No overnight events.  On evaluation today,   Objective   Blood pressure (!) 148/122, pulse 100, temperature 98.1 F (36.7 C), temperature source Axillary, resp. rate 17, height 5\' 4"  (1.626 m), weight 57 kg, SpO2 94 %.    Vent Mode: PRVC FiO2 (%):  [40 %-60 %] 60 % Set Rate:  [16 bmp] 16 bmp Vt Set:  [420 mL] 420 mL PEEP:  [5 cmH20] 5 cmH20 Plateau Pressure:  [22 cmH20-29 cmH20] 22 cmH20   Intake/Output Summary (Last 24 hours) at 06/04/2020 0703 Last data filed at 06/04/2020 0600 Gross per 24 hour  Intake 1472.63 ml  Output 1750 ml  Net -277.37 ml   Filed Weights   06/03/20 0550 06/04/20 0401  Weight: 56.5 kg 57 kg    Examination: General: chronically ill appearing, trach to vent, a little lethargic HEENT: #6 Legacy cuffed trach midline, minimal secretions Neuro: resting comfortably, arouses to voice and follows simple commands CV: s1s2 regular rate and rhythm, no murmur, rubs, or gallops,  PULM: diminished air entry, no crackles, no increased wob GI: soft, nontender, PEG present Extremities: thin no edema   Resolved Hospital Problem list     Assessment & Plan:  Acute Hypoxic and hypercapnic Respiratory Failure  Severe COPD s/p tracheostomy and home ventilation - possible malfunction of home ventilator and possible recurrent VAP P: Continue ventilator support with lung protective strategies  Wean PEEP and FiO2 for sats greater than 90%. Head of bed elevated 30 degrees. Plateau pressures less than 30 cm H20.  Follow intermittent chest x-ray and ABG.   SAT/SBT as  tolerated, mentation preclude extubation  Ensure adequate pulmonary hygiene  Pan culture VAP bundle in place.  PAD protocol  BCID grew Staph species  WBC of 24 on presentation  LA of 2.3 Procal of 0.45>0.98 BCx pending MRSA negative  PCR  Severe sepsis - possible VAP P: Continue cefepime. mrsa pcr negative so will stop vancomycin Vent support as above IV hydration MAP goal greater than 65 Trend lactic acid Procalcitonin 0.45. will send a repeat for tomorrow.   Transaminitis -LFTs elevated on admission P: Avoid hepatotoxins  Trend LFTs Closely monitor LFTs in the setting of chronic antiepileptic medications  History of hypertension P: Closely monitor hemodynamics in the ICU setting As needed IV hydralazine as needed. Will resume home norvasc Continuous telemetry  At risk malnutrition P: Continue tube feeds through PEG Supplement protein   Best practice (evaluated daily)  Diet: Tube feeds resumed through PEG Pain/Anxiety/Delirium protocol (if indicated): In place VAP protocol (if indicated): In place DVT prophylaxis: Subcu heparin GI prophylaxis: PPI Glucose control: Monitor Mobility: Bedrest Disposition: ICU  Goals of Care:  Last date of multidisciplinary goals of care discussion: Pending Family and staff present: Pending Summary of discussion: Pending Follow up goals of care discussion due: Pending Code Status: Full   Labs   CBC: Recent Labs  Lab 06/03/20 0036 06/03/20 0052 06/03/20 0340  WBC 24.2*  --  19.1*  HGB 10.3* 11.2* 10.0*  HCT 35.1* 33.0* 32.5*  MCV 97.0  --  94.2  PLT 326  --  284    Basic Metabolic Panel: Recent Labs  Lab 06/03/20 0036 06/03/20 0052 06/03/20 0340 06/03/20 1751 06/04/20 0323  NA 139 139  --   --   --   K 4.4 4.4  --   --   --   CL 98  --   --   --   --   CO2 28  --   --   --   --   GLUCOSE 158*  --   --   --   --   BUN 41*  --   --   --   --   CREATININE 0.60  --  0.50  --   --   CALCIUM 9.1  --   --   --   --   MG  --   --  2.4 2.1 2.0  PHOS  --   --  4.9* 4.2 3.4   GFR: Estimated Creatinine Clearance: 67.8 mL/min (by C-G formula based on SCr of 0.5 mg/dL). Recent Labs  Lab 06/03/20 0035 06/03/20 0036 06/03/20 0340 06/04/20 0323   PROCALCITON  --   --  0.45 0.98  WBC  --  24.2* 19.1*  --   LATICACIDVEN 2.3*  --   --   --     Liver Function Tests: Recent Labs  Lab 06/03/20 0036  AST 149*  ALT 88*  ALKPHOS 163*  BILITOT 0.7  PROT 6.1*  ALBUMIN 2.7*   No results for input(s): LIPASE, AMYLASE in the last 168 hours. No results for input(s): AMMONIA in the last 168 hours.  ABG    Component Value Date/Time   PHART 7.495 (H) 05/19/2020 1055   PCO2ART 47.1 05/19/2020 1055   PO2ART 155 (H) 05/19/2020 1055   HCO3 33.0 (H) 06/03/2020 0052   TCO2 35 (H) 06/03/2020 0052   O2SAT 99.0 06/03/2020 0052     Coagulation Profile: No results for input(s): INR, PROTIME in the last 168 hours.  Cardiac Enzymes: No  results for input(s): CKTOTAL, CKMB, CKMBINDEX, TROPONINI in the last 168 hours.  HbA1C: Hgb A1c MFr Bld  Date/Time Value Ref Range Status  02/26/2020 05:18 PM 5.4 4.8 - 5.6 % Final    Comment:    (NOTE) Pre diabetes:          5.7%-6.4%  Diabetes:              >6.4%  Glycemic control for   <7.0% adults with diabetes   12/22/2018 04:37 AM 5.5 4.8 - 5.6 % Final    Comment:    (NOTE) Pre diabetes:          5.7%-6.4% Diabetes:              >6.4% Glycemic control for   <7.0% adults with diabetes     CBG: Recent Labs  Lab 06/03/20 1117 06/03/20 1641 06/03/20 1952 06/03/20 2347 06/04/20 0352  GLUCAP 93 100* 90 103* 123*      Chari Manning, D.O.  Internal Medicine Resident, PGY-2 [facility]  Pager: [phone] 7:03 AM, 06/04/2020

## 2020-06-05 DIAGNOSIS — J449 Chronic obstructive pulmonary disease, unspecified: Secondary | ICD-10-CM | POA: Diagnosis not present

## 2020-06-05 DIAGNOSIS — J9600 Acute respiratory failure, unspecified whether with hypoxia or hypercapnia: Secondary | ICD-10-CM | POA: Diagnosis not present

## 2020-06-05 DIAGNOSIS — J9622 Acute and chronic respiratory failure with hypercapnia: Secondary | ICD-10-CM | POA: Diagnosis not present

## 2020-06-05 DIAGNOSIS — J9621 Acute and chronic respiratory failure with hypoxia: Secondary | ICD-10-CM | POA: Diagnosis not present

## 2020-06-05 LAB — COMPREHENSIVE METABOLIC PANEL
ALT: 49 U/L — ABNORMAL HIGH (ref 0–44)
AST: 20 U/L (ref 15–41)
Albumin: 2.4 g/dL — ABNORMAL LOW (ref 3.5–5.0)
Alkaline Phosphatase: 133 U/L — ABNORMAL HIGH (ref 38–126)
Anion gap: 11 (ref 5–15)
BUN: 25 mg/dL — ABNORMAL HIGH (ref 6–20)
CO2: 32 mmol/L (ref 22–32)
Calcium: 8.9 mg/dL (ref 8.9–10.3)
Chloride: 99 mmol/L (ref 98–111)
Creatinine, Ser: 0.31 mg/dL — ABNORMAL LOW (ref 0.44–1.00)
GFR, Estimated: >60 mL/min (ref >60)
Glucose, Bld: 165 mg/dL — ABNORMAL HIGH (ref 70–99)
Potassium: 3.3 mmol/L — ABNORMAL LOW (ref 3.5–5.1)
Sodium: 142 mmol/L (ref 135–145)
Total Bilirubin: 0.4 mg/dL (ref 0.3–1.2)
Total Protein: 5.8 g/dL — ABNORMAL LOW (ref 6.5–8.1)

## 2020-06-05 LAB — CULTURE, BLOOD (ROUTINE X 2): Special Requests: ADEQUATE

## 2020-06-05 LAB — CULTURE, RESPIRATORY W GRAM STAIN: Culture: NORMAL

## 2020-06-05 LAB — CBC
HCT: 35.9 % — ABNORMAL LOW (ref 36.0–46.0)
Hemoglobin: 10.5 g/dL — ABNORMAL LOW (ref 12.0–15.0)
MCH: 27.9 pg (ref 26.0–34.0)
MCHC: 29.2 g/dL — ABNORMAL LOW (ref 30.0–36.0)
MCV: 95.5 fL (ref 80.0–100.0)
Platelets: 255 10*3/uL (ref 150–400)
RBC: 3.76 MIL/uL — ABNORMAL LOW (ref 3.87–5.11)
RDW: 17.8 % — ABNORMAL HIGH (ref 11.5–15.5)
WBC: 14.9 10*3/uL — ABNORMAL HIGH (ref 4.0–10.5)
nRBC: 0 % (ref 0.0–0.2)

## 2020-06-05 LAB — GLUCOSE, CAPILLARY
Glucose-Capillary: 154 mg/dL — ABNORMAL HIGH (ref 70–99)
Glucose-Capillary: 144 mg/dL — ABNORMAL HIGH (ref 70–99)
Glucose-Capillary: 139 mg/dL — ABNORMAL HIGH (ref 70–99)
Glucose-Capillary: 147 mg/dL — ABNORMAL HIGH (ref 70–99)
Glucose-Capillary: 146 mg/dL — ABNORMAL HIGH (ref 70–99)

## 2020-06-05 LAB — PROCALCITONIN: Procalcitonin: 0.65 ng/mL

## 2020-06-05 MED ORDER — METOPROLOL TARTRATE 12.5 MG HALF TABLET
12.5000 mg | ORAL_TABLET | Freq: Once | ORAL | Status: AC
  Administered 2020-06-05: 12.5 mg
  Filled 2020-06-05: qty 1

## 2020-06-05 MED ORDER — PREDNISONE 20 MG PO TABS
20.0000 mg | ORAL_TABLET | Freq: Every day | ORAL | Status: AC
  Administered 2020-06-05 – 2020-06-09 (×5): 20 mg
  Filled 2020-06-05 (×5): qty 1

## 2020-06-05 MED ORDER — POTASSIUM CHLORIDE 20 MEQ PO PACK
20.0000 meq | PACK | ORAL | Status: AC
  Administered 2020-06-05 (×2): 20 meq
  Filled 2020-06-05 (×2): qty 1

## 2020-06-05 MED ORDER — LABETALOL HCL 5 MG/ML IV SOLN: 5.0000 mg | INTRAVENOUS | Status: DC | PRN

## 2020-06-05 MED ORDER — POTASSIUM CHLORIDE 10 MEQ/100ML IV SOLN
10.0000 meq | INTRAVENOUS | Status: AC
  Administered 2020-06-05 (×3): 10 meq via INTRAVENOUS
  Filled 2020-06-05 (×3): qty 100

## 2020-06-05 MED ORDER — LABETALOL HCL 5 MG/ML IV SOLN
5.0000 mg | INTRAVENOUS | Status: DC | PRN
  Administered 2020-06-05 – 2020-06-07 (×8): 5 mg via INTRAVENOUS
  Filled 2020-06-05 (×7): qty 4

## 2020-06-05 NOTE — Progress Notes (Signed)
   06/05/20 0800  Tracheostomy Shiley Legacy 6 mm Cuffed  Placement Date/Time: 06/03/20 0039   Inserted prior to hospital arrival?: Yes  Brand: Shiley Legacy  Size (mm): 6 mm  Style: Cuffed  Status Secured  Site Assessment Clean  Inner Cannula Care Changed/new  Ties Assessment Secure  Cuff pressure (cm) 25 cm  Tracheostomy Equipment at bedside Yes and checklist posted at head of bed  Adult Ventilator Settings  Vent Type Servo U  Humidity HME  Vent Mode PRVC  Set Rate 16 bmp  FiO2 (%) 50 %  I Time 0.9 Sec(s)  PEEP 5 cmH20  Vt Set 420 mL  Adult Ventilator Measurements  Peak Airway Pressure 27 L/min  Mean Airway Pressure 11 cmH20  Plateau Pressure 19 cmH20  Resp Rate Spontaneous 5 br/min  Resp Rate Total 21 br/min  Exhaled Vt 460 mL  Measured Ve 8.1 mL  SpO2 93 %  Auto PEEP 0 cmH20  Total PEEP 5 cmH20  I:E Ratio Measured 1:2.8  Adult Ventilator Alarms  Alarms On Y  Ve High Alarm 18 L/min  Ve Low Alarm 3 L/min  Resp Rate High Alarm 38 br/min  Resp Rate Low Alarm 8  PEEP Low Alarm 3 cmH2O  Press High Alarm 50 cmH2O  Daily Weaning Assessment  Daily Assessment of Readiness to Wean Wean protocol criteria not met (on home vent)  Pre-WUA / WUA Start  Pre WUA sedation dose Intermittent  Follows commands Yes  Synchrony No  Termination of Wake Up Assessment (Document within 2 hours of WUA Start)  Follows commands Yes  Synchrony No  WUA Patient Response/RASS Score -1  Sedation outcome Bolus given  Breath Sounds  Bilateral Breath Sounds Diminished  Vent Respiratory Assessment  Level of Consciousness Responds to Voice  Respiratory Pattern Tachypnea;Accessory muscle use  Airway Suctioning/Secretions  Suction Type Tracheal  Suction Device  Catheter  Secretion Amount Moderate  Secretion Color Tan  Secretion Consistency Thick  Suction Tolerance Tolerated well  Suctioning Adverse Effects None  [place] Agitation Sedation Scale  [place] Agitation Sedation Scale (RASS) 1   RASS Goal 0

## 2020-06-05 NOTE — Progress Notes (Signed)
NAME:  Kathleen Cameron, MRN:  [id number], DOB:  27-Nov-1965, LOS: 2 ADMISSION DATE:  06/03/2020, CONSULTATION DATE:  06/02/2020 REFERRING MD:  Dr. Pilar Plate, CHIEF COMPLAINT: Acute respiratory distress  Brief History:  55 year old female presented from home via EMS with complaints of acute respiratory distress.  [facility]  PCCM consulted for further management and admission  History of Present Illness:  Kathleen Cameron is a 55 y.o. with a past medical history significant for seizure disorder, hypertension, severe COPD, acute on chronic hypoxic and hypercapnic respiratory failure, and anxiety who presented to the ED via EMS with acute on chronic hypoxic and hypercapnic respiratory failure.  Of note patient was recently discharged from [facility] 2 days prior to admission.  Primary history obtained from chart review as patient is currently on ventilator through chronic trach.  It appears patient was treated at this facility November 2021 for acute on chronic hypoxic respiratory failure she required endotracheal intubation and eventual tracheostomy due to difficulty weaning 03/02/2020.  On 11/18 [facility]  Upon discharge home per chart review it appears patient's home ventilator malfunctioned, which resulted in respiratory failure and need for bag mask ventilation.  On arrival to ED patient was placed on ventilator with home vent settings with no further acute respiratory compromise.  PCCM was called for further management and admission.   Past Medical History:  Seizure disorder Hypertension Severe COPD Acute on chronic hypoxic and hypercapnic respiratory failure Anxiety  Significant Hospital Events:  Admitted 2/13  Consults:    Procedures:    Significant Diagnostic Tests:  Chest x-ray 2/13 > worsening left perihilar airspace disease with increased patchy opacities and bronchial thickening suspicious for worsening  pneumonia  Micro Data:  Covid 2/13 > negative  Blood culture 2/13 > GPC in clusters (2x), NG 1d (2x) Sputum culture 2/13 > Rare WBC (PMN/Mono), Few GPC in chains, Few GNR>> Few pseudomonad/Diphtheroids pansenitive MRSA PCR 2/13 > negative   Antimicrobials:  Cefepime 2/13 > Vancomycin 2/13 - 2/13  Interim History / Subjective:   Overnight, Patient had HTN as high as 214 SBP requiring lopressor.   On evaluation today, patient is resting in bed with trach in place. She is awake but sedated and able to follow commands. Noted to have some secretions on suctioning.    Objective   Blood pressure (!) 128/107, pulse (!) 112, temperature 98.9 F (37.2 C), temperature source Axillary, resp. rate (!) 22, height 5\' 4"  (1.626 m), weight 57.9 kg, SpO2 93 %.    Vent Mode: PRVC FiO2 (%):  [40 %-50 %] 50 % Set Rate:  [16 bmp] 16 bmp Vt Set:  [420 mL] 420 mL PEEP:  [5 cmH20] 5 cmH20 Plateau Pressure:  [18 cmH20-30 cmH20] 19 cmH20   Intake/Output Summary (Last 24 hours) at 06/05/2020 0825 Last data filed at 06/05/2020 0800 Gross per 24 hour  Intake 1791.27 ml  Output 1825 ml  Net -33.73 ml   Filed Weights   06/03/20 0550 06/04/20 0401 06/05/20 0420  Weight: 56.5 kg 57 kg 57.9 kg    Examination: General: chronically ill appearing woman laying in bed trached on MV HEENT: [place]/AT, oral mucosa moist Neck: Shiley #6 cuffed trach, no secretions Neuro: sleeping, arousable to stimulation CV: S1S2, RRR PULM: Synchronous with MV, continues to have some tan trach secretions. Diminished bilateral chest tubes. GI: soft, NT Extremities: no cyanosis or edema  LDA: Foley (2d) Trach (37mm cuffed) (2d) Peripheral IV - Lt wrist (2d), Lf  FA (2d), Rt FA (2d)  Resolved Hospital Problem list   Severe sepsis  Assessment & Plan:    Acute on chronic hypoxic and hypercapnic respiratory failure due to COPD, vent dependetn. Chronic MV with vent malfunctioned (totally turned off- confirmed by home respiratory  company).  Progressive VAP/HAP since discharged from [facility].  Patient discharged from [facility] with evidence of pneumonia. Patient's MV malfunctioned at home/shut off. Pneumonia progressed causing hypoxic/hypercarbic respiratory failure. BCID showed staph and BCx pending and SputumCx groweing few pseudomonas and mod cornybacterium diphtheroids that are pan sensitive. On day 4 of antibiotic therapy with Cefepime. Procal trending downward 0.45>0.98>0.65 - Husband should continue vent training. - Cont Cefepime (D4) transition to oral agent  - New vent delivered to house -con't lung protective vent strategies -con't nebulized steroids (apparently had intolerance to Stiolto); trial of adding brovana and yupelri -VAP prevention protocol  Severe sepsis, likely VAP.  - Sepsis has improved  - Procal trending down and afebrile  - CBC pending  Transaminase elevation, likely 2/2 sepsis -LFTs elevated on admission -avoid hepatotoxic agents -CMP pending this morning  History of hypertension -IV hydralazine as needed -con't PTA amlodipine Continuous telemetry  Hypothyroidism -con't synthroid  At risk malnutrition -Continue tube feeds through PEG   Best practice (evaluated daily)  Diet: Tube feeds resumed through PEG Pain/Anxiety/Delirium protocol (if indicated): In place VAP protocol (if indicated): In place DVT prophylaxis: Subcu heparin GI prophylaxis: PPI Glucose control: Monitor Mobility: Bedrest Disposition: ICU  Goals of Care:  Last date of multidisciplinary goals of care discussion: Pending Family and staff present: Pending Summary of discussion: Pending Follow up goals of care discussion due: Pending Code Status: Full    Labs   CBC: Recent Labs  Lab 06/03/20 0036 06/03/20 0052 06/03/20 0340  WBC 24.2*  --  19.1*  HGB 10.3* 11.2* 10.0*  HCT 35.1* 33.0* 32.5*  MCV 97.0  --  94.2  PLT 326  --  284    Basic Metabolic Panel: Recent Labs  Lab  06/03/20 0036 06/03/20 0052 06/03/20 0340 06/03/20 1751 06/04/20 0323  NA 139 139  --   --   --   K 4.4 4.4  --   --   --   CL 98  --   --   --   --   CO2 28  --   --   --   --   GLUCOSE 158*  --   --   --   --   BUN 41*  --   --   --   --   CREATININE 0.60  --  0.50  --   --   CALCIUM 9.1  --   --   --   --   MG  --   --  2.4 2.1 2.0  PHOS  --   --  4.9* 4.2 3.4   GFR: Estimated Creatinine Clearance: 67.8 mL/min (by C-G formula based on SCr of 0.5 mg/dL). Recent Labs  Lab 06/03/20 0035 06/03/20 0036 06/03/20 0340 06/04/20 0323 06/05/20 0151  PROCALCITON  --   --  0.45 0.98 0.65  WBC  --  24.2* 19.1*  --   --   LATICACIDVEN 2.3*  --   --   --   --     Liver Function Tests: Recent Labs  Lab 06/03/20 0036  AST 149*  ALT 88*  ALKPHOS 163*  BILITOT 0.7  PROT 6.1*  ALBUMIN 2.7*   No results for input(s): LIPASE,  AMYLASE in the last 168 hours. No results for input(s): AMMONIA in the last 168 hours.  ABG    Component Value Date/Time   PHART 7.495 (H) 05/19/2020 1055   PCO2ART 47.1 05/19/2020 1055   PO2ART 155 (H) 05/19/2020 1055   HCO3 33.0 (H) 06/03/2020 0052   TCO2 35 (H) 06/03/2020 0052   O2SAT 99.0 06/03/2020 0052     Coagulation Profile: No results for input(s): INR, PROTIME in the last 168 hours.  Cardiac Enzymes: No results for input(s): CKTOTAL, CKMB, CKMBINDEX, TROPONINI in the last 168 hours.  HbA1C: Hgb A1c MFr Bld  Date/Time Value Ref Range Status  02/26/2020 05:18 PM 5.4 4.8 - 5.6 % Final    Comment:    (NOTE) Pre diabetes:          5.7%-6.4%  Diabetes:              >6.4%  Glycemic control for   <7.0% adults with diabetes   12/22/2018 04:37 AM 5.5 4.8 - 5.6 % Final    Comment:    (NOTE) Pre diabetes:          5.7%-6.4% Diabetes:              >6.4% Glycemic control for   <7.0% adults with diabetes     CBG: Recent Labs  Lab 06/04/20 1132 06/04/20 1603 06/04/20 1947 06/04/20 2256 06/05/20 0733  GLUCAP 139* 144* 114* 141*  154*

## 2020-06-05 NOTE — Progress Notes (Signed)
Pharmacy Electrolyte Replacement  Recent Labs:  Recent Labs    06/04/20 0323 06/05/20 0800  K  --  3.3*  MG 2.0  --   PHOS 3.4  --   CREATININE  --  0.31*    Plan:  -K 3.3, replace with 70 mEq KCl  Thank you,  Margarite Gouge, PharmD PGY2 ID Pharmacy Resident Phone between 7 am - 3:30 pm: [phone]  Please check AMION for all [facility] Pharmacy phone numbers After 10:00 PM, call Main Pharmacy [phone]

## 2020-06-05 NOTE — TOC Initial Note (Addendum)
Transition of Care [facility]) - Initial/Assessment Note    Patient Details  Name: Kathleen Cameron MRN: [id number] Date of Birth: 1965/08/30  Transition of Care [facility]) CM/SW Contact:    Lawerance Sabal, RN Phone Number: 06/05/2020, 11:51 AM  Clinical Narrative:                 Sherron Monday w spouse at bedside. He states that home vent malfunctioned. He states that DME company had replaced it Sunday night after she came to the hospital. He states that he had training on vent prior to leaving [facility] and had a full understanding for its use and was comfortable managing it. He states the vent was replaced with the same model and he has no anticipated problems using it going forward. He states that he is home 24/7 to support patient.  Spoke w [employer] rep who confirmed they are supplier of vent. Requested on behalf of spouse that they supply with larger transport tanks for travel to MD appointments.  Spouse could not name home health company that was arranged to see them, it is not listed in [place], will see if I can find info in previous records.   Per [employer] rep, patient was not set up with home health services. Doubtful they could be established due to payor source.  [employer] will send  HH RT to home with patient at time of DC for complete support and assistance. Spouse has personal number for RT going forward in case of issues or problems with vent.    Expected Discharge Plan: Home w Home Health Services Barriers to Discharge: Continued Medical Work up   Patient Goals and CMS Choice Patient states their goals for this hospitalization and ongoing recovery are:: to go home      Expected Discharge Plan and Services Expected Discharge Plan: Home w Home Health Services   Discharge Planning Services: CM Consult Post Acute Care Choice: Home Health,Durable Medical Equipment Living arrangements for the past 2 months: Apartment                                      Prior Living  Arrangements/Services Living arrangements for the past 2 months: Apartment Lives with:: Spouse                   Activities of Daily Living      Permission Sought/Granted                  Emotional Assessment              Admission diagnosis:  Respiratory failure [facility]) [J96.90] Patient Active Problem List   Diagnosis Date Noted  . Sepsis (HCC) 06/03/2020  . Healthcare-associated pneumonia 06/03/2020  . Pressure injury of skin 06/03/2020  . Acute on chronic respiratory failure with hypoxia (HCC)   . Seizure disorder (HCC)   . COPD, severe (HCC)   . Anxiety disorder   . Generalized abdominal pain   . Convulsions (HCC)   . Malnutrition of moderate degree 02/27/2020  . Hypotension   . Acute hypercapnic respiratory failure (HCC) 02/25/2020  . Right upper quadrant abdominal pain 02/01/2020  . Hypothyroidism (acquired) 02/01/2020  . Acute metabolic encephalopathy 01/29/2020  . Hyponatremia 01/28/2020  . Anxiety 12/02/2019  . COPD exacerbation (HCC) 11/06/2019  . Depression 11/06/2019  . Acute on chronic respiratory failure with hypoxia and hypercapnia (HCC) 11/06/2019  . Current moderate episode of major  depressive disorder without prior episode (HCC) 09/20/2019  . Pedal edema 08/04/2019  . Chronic respiratory failure with hypoxia (HCC) 03/07/2019  . CKD (chronic kidney disease) 01/05/2019  . COPD (chronic obstructive pulmonary disease) (HCC) 09/29/2018  . Pulmonary nodules 09/27/2018  . Dyslipidemia 07/07/2018  . Essential hypertension 03/10/2018   PCP:  [name], MD Pharmacy:   [facility] [phone] - [place], [place] - [address] AT [facility] [address] [place] [place] [address] Phone: [phone] Fax: [phone]     Social Determinants of Health (SDOH) Interventions    Readmission Risk Interventions Readmission Risk Prevention Plan 02/02/2020  Transportation Screening Complete  PCP  or Specialist Appt within 3-5 Days Complete  HRI or Home Care Consult Complete  Social Work Consult for Recovery Care Planning/Counseling Complete  Palliative Care Screening Not Applicable  Medication Review [employer]) Complete  Some recent data might be hidden

## 2020-06-05 NOTE — Progress Notes (Signed)
eLink Physician-Brief Progress Note Patient Name: Kathleen Cameron DOB: 05-Apr-1966 MRN: [id number]   Date of Service  06/05/2020  HPI/Events of Note  Hypertension - BP = 184/105.   eICU Interventions  Plan: 1. Metoprolol 12.5 mg per tube now.      Intervention Category Major Interventions: Hypertension - evaluation and management  Kathleen Cameron 06/05/2020, 3:08 AM

## 2020-06-06 ENCOUNTER — Telehealth: Payer: Self-pay | Admitting: Emergency Medicine

## 2020-06-06 DIAGNOSIS — J9621 Acute and chronic respiratory failure with hypoxia: Secondary | ICD-10-CM | POA: Diagnosis not present

## 2020-06-06 DIAGNOSIS — F419 Anxiety disorder, unspecified: Secondary | ICD-10-CM | POA: Diagnosis not present

## 2020-06-06 DIAGNOSIS — J9600 Acute respiratory failure, unspecified whether with hypoxia or hypercapnia: Secondary | ICD-10-CM | POA: Diagnosis not present

## 2020-06-06 DIAGNOSIS — J9622 Acute and chronic respiratory failure with hypercapnia: Secondary | ICD-10-CM | POA: Diagnosis not present

## 2020-06-06 LAB — COMPREHENSIVE METABOLIC PANEL
ALT: 42 U/L (ref 0–44)
AST: 30 U/L (ref 15–41)
Albumin: 2.2 g/dL — ABNORMAL LOW (ref 3.5–5.0)
Alkaline Phosphatase: 130 U/L — ABNORMAL HIGH (ref 38–126)
Anion gap: 10 (ref 5–15)
BUN: 20 mg/dL (ref 6–20)
CO2: 35 mmol/L — ABNORMAL HIGH (ref 22–32)
Calcium: 9 mg/dL (ref 8.9–10.3)
Chloride: 100 mmol/L (ref 98–111)
Creatinine, Ser: 0.31 mg/dL — ABNORMAL LOW (ref 0.44–1.00)
GFR, Estimated: >60 mL/min (ref >60)
Glucose, Bld: 156 mg/dL — ABNORMAL HIGH (ref 70–99)
Potassium: 4.3 mmol/L (ref 3.5–5.1)
Sodium: 145 mmol/L (ref 135–145)
Total Bilirubin: 0.7 mg/dL (ref 0.3–1.2)
Total Protein: 6.1 g/dL — ABNORMAL LOW (ref 6.5–8.1)

## 2020-06-06 LAB — CBC
HCT: 33.9 % — ABNORMAL LOW (ref 36.0–46.0)
Hemoglobin: 10.5 g/dL — ABNORMAL LOW (ref 12.0–15.0)
MCH: 28.7 pg (ref 26.0–34.0)
MCHC: 31 g/dL (ref 30.0–36.0)
MCV: 92.6 fL (ref 80.0–100.0)
Platelets: 252 10*3/uL (ref 150–400)
RBC: 3.66 MIL/uL — ABNORMAL LOW (ref 3.87–5.11)
RDW: 17.6 % — ABNORMAL HIGH (ref 11.5–15.5)
WBC: 13.2 10*3/uL — ABNORMAL HIGH (ref 4.0–10.5)
nRBC: 0 % (ref 0.0–0.2)

## 2020-06-06 LAB — PROCALCITONIN: Procalcitonin: 0.41 ng/mL

## 2020-06-06 LAB — GLUCOSE, CAPILLARY
Glucose-Capillary: 145 mg/dL — ABNORMAL HIGH (ref 70–99)
Glucose-Capillary: 138 mg/dL — ABNORMAL HIGH (ref 70–99)
Glucose-Capillary: 166 mg/dL — ABNORMAL HIGH (ref 70–99)
Glucose-Capillary: 248 mg/dL — ABNORMAL HIGH (ref 70–99)
Glucose-Capillary: 47 mg/dL — ABNORMAL LOW (ref 70–99)
Glucose-Capillary: 115 mg/dL — ABNORMAL HIGH (ref 70–99)
Glucose-Capillary: 120 mg/dL — ABNORMAL HIGH (ref 70–99)

## 2020-06-06 MED ORDER — DEXTROSE IN LACTATED RINGERS 5 % IV SOLN: INTRAVENOUS | Status: DC

## 2020-06-06 MED ORDER — SIMETHICONE 40 MG/0.6ML PO SUSP
40.0000 mg | Freq: Four times a day (QID) | ORAL | Status: DC | PRN
  Administered 2020-06-06 – 2020-06-25 (×8): 40 mg
  Filled 2020-06-06 (×9): qty 0.6

## 2020-06-06 MED ORDER — DEXTROSE 50 % IV SOLN
INTRAVENOUS | Status: AC
  Administered 2020-06-06: 25 mL
  Filled 2020-06-06: qty 50

## 2020-06-06 MED ORDER — INSULIN ASPART 100 UNIT/ML ~~LOC~~ SOLN
2.0000 [IU] | SUBCUTANEOUS | Status: DC
  Administered 2020-06-06: 4 [IU] via SUBCUTANEOUS
  Administered 2020-06-06: 6 [IU] via SUBCUTANEOUS

## 2020-06-06 MED ORDER — HYDROCHLOROTHIAZIDE 10 MG/ML ORAL SUSPENSION
12.5000 mg | Freq: Every day | ORAL | Status: DC
  Administered 2020-06-06 – 2020-06-08 (×3): 13 mg
  Filled 2020-06-06 (×8): qty 1.88

## 2020-06-06 MED ORDER — FREE WATER
160.0000 mL | Freq: Every day | Status: DC
  Administered 2020-06-06 – 2020-06-18 (×55): 160 mL

## 2020-06-06 MED ORDER — HYDROCHLOROTHIAZIDE 12.5 MG PO CAPS: 12.5000 mg | ORAL_CAPSULE | Freq: Every day | ORAL | Status: DC

## 2020-06-06 MED ORDER — OSMOLITE 1.5 CAL PO LIQD
237.0000 mL | Freq: Every day | ORAL | Status: DC
  Administered 2020-06-06 – 2020-06-18 (×54): 237 mL
  Filled 2020-06-06 (×63): qty 237

## 2020-06-06 NOTE — Progress Notes (Addendum)
Initial Nutrition Assessment  DOCUMENTATION CODES:   Non-severe (moderate) malnutrition in context of chronic illness  INTERVENTION:   Tube feeding via PEG: Change to bolus feedings: Osmolite 1.5 237 ml (1 carton) 5 times per day (1185 ml per day)  Provides 1775 kcal, 75 gm protein, 905 ml free water daily  Free water flushes 80 ml before and after each bolus feeding for an additional 800 ml free water daily, 1705 ml total.   NUTRITION DIAGNOSIS:   Moderate Malnutrition related to chronic illness (COPD) as evidenced by mild muscle depletion,mild fat depletion.  Ongoing  GOAL:   Patient will meet greater than or equal to 90% of their needs  Met with TF  MONITOR:   Vent status,TF tolerance,Labs  REASON FOR ASSESSMENT:   Ventilator,Consult Enteral/tube feeding initiation and management  ASSESSMENT:   55 yo female admitted from home with ventilator malfunction. She was discharged home from [place] and required re-admission to [facility] on the same day. PMH includes HTN, asthma, COPD, seizure disorder, anxiety disorder.   Discussed patient in ICU rounds and with RN today. Receiving TF via PEG: Vital AF 1.2 at 50 ml/h to provide 1440 kcal, 90 gm protein, 973 ml free water daily. Tolerating well. Will change to bolus TF today using the same formula (Osmolite 1.5) that patient has at home (delivered after recent d/c home from [facility]).   Patient remains intubated on ventilator support MV: 9.8 L/min Temp (24hrs), Avg:98.6 F (37 C), Min:98.1 F (36.7 C), Max:99.2 F (37.3 C)   Labs reviewed.  CBG: [phone]  Medications reviewed and include Colace, HCTZ, Novolog, MVI with minerals daily, prednisone.  Weight 55.4 kg today   Diet Order:   Diet Order            Diet NPO time specified  Diet effective now                 EDUCATION NEEDS:   Not appropriate for education at this time  Skin:  Skin Assessment: Skin Integrity Issues: Skin Integrity Issues::  Stage II Stage II: R buttocks, R ankle  Last BM:  2/16 type 4  Height:   Ht Readings from Last 1 Encounters:  06/03/20 $RemoveB'5\' 4"'[id number]$  (1.626 m)    Weight:   Wt Readings from Last 1 Encounters:  06/06/20 55.4 kg    BMI:  Body mass index is 20.96 kg/m.  Estimated Nutritional Needs:   Kcal:  1410  Protein:  80-95 gm  Fluid:  >/= 1.5 L    Lucas Mallow, RD, LDN, CNSC Please refer to Amion for contact information.

## 2020-06-06 NOTE — Progress Notes (Signed)
NAME:  Kathleen Cameron, MRN:  [id number], DOB:  09/06/1965, LOS: 3 ADMISSION DATE:  06/03/2020, CONSULTATION DATE:  06/02/2020 REFERRING MD:  Dr. Pilar Plate, CHIEF COMPLAINT: Acute respiratory distress  Brief History:  55 year old female presented from home via EMS with complaints of acute respiratory distress.  [facility]  PCCM consulted for further management and admission  History of Present Illness:  Kathleen Cameron is a 55 y.o. with a past medical history significant for seizure disorder, hypertension, severe COPD, acute on chronic hypoxic and hypercapnic respiratory failure, and anxiety who presented to the ED via EMS with acute on chronic hypoxic and hypercapnic respiratory failure.  Of note patient was recently discharged from [facility] 2 days prior to admission.  Primary history obtained from chart review as patient is currently on ventilator through chronic trach.  It appears patient was treated at this facility November 2021 for acute on chronic hypoxic respiratory failure she required endotracheal intubation and eventual tracheostomy due to difficulty weaning 03/02/2020.  On 11[facility]  Upon discharge home per chart review it appears patient's home ventilator malfunctioned, which resulted in respiratory failure and need for bag mask ventilation.  On arrival to ED patient was placed on ventilator with home vent settings with no further acute respiratory compromise.  PCCM was called for further management and admission.   Past Medical History:  Seizure disorder Hypertension Severe COPD Acute on chronic hypoxic and hypercapnic respiratory failure Anxiety  Significant Hospital Events:  Admitted 2/13  Consults:  TOC, PT/OT  Procedures:  n/a  Significant Diagnostic Tests:  Chest x-ray 2/13 > worsening left perihilar airspace disease with increased patchy opacities and bronchial thickening suspicious for  worsening pneumonia  Micro Data:  Covid 2/13 > negative  2/13 BCID - staph species  Blood culture 2/13 > GPC in clusters (Staph Hominis) (2x), NG 1d (2x) Sputum culture 2/13 > RARE WBC PRESENT,BOTH PMN AND MONONUCLEAR, FEW GRAM, POSITIVE COCCI IN PAIRS IN CHAINS, RARE GRAM NEGATIVE RODS  MRSA PCR 2/13 > negative    Antimicrobials:  Cefepime 2/13 > Vancomycin 2/13 - 2/13  Interim History / Subjective:   No overnight events.   On evaluation today, patient is resting in bed with trach in place. She is awake and following commands. She denies any complaints but does periodically need to be suctioned.   Objective   Blood pressure (!) 166/103, pulse (!) 107, temperature 98.1 F (36.7 C), temperature source Oral, resp. rate 20, height 5\' 4"  (1.626 m), weight 55.4 kg, SpO2 95 %.    Vent Mode: PRVC FiO2 (%):  [50 %] 50 % Set Rate:  [16 bmp] 16 bmp Vt Set:  [420 mL] 420 mL PEEP:  [5 cmH20] 5 cmH20 Plateau Pressure:  [16 cmH20-22 cmH20] 21 cmH20   Intake/Output Summary (Last 24 hours) at 06/06/2020 06/08/2020 Last data filed at 06/06/2020 0600 Gross per 24 hour  Intake 2018.5 ml  Output 1850 ml  Net 168.5 ml   Filed Weights   06/04/20 0401 06/05/20 0420 06/06/20 0318  Weight: 57 kg 57.9 kg 55.4 kg    Examination: General: chronically ill appearing woman laying in bed trached on MV HEENT: [place]/AT, oral mucosa moist Neck: Some tan/p[ink secretions Neuro: alert and oriented and following commands CV: S1S2, RRR PULM: Patient had some rales that improved with suctioning, no rhonchi appreciable.  GI: soft, NT Extremities: no cyanosis or edema  LDA: Foley (2d) Trach (3mm cuffed) (2d) Peripheral IV - Lt  wrist (2d), Lf FA (2d), Rt FA (2d)  Resolved Hospital Problem list   Severe sepsis  Assessment & Plan:   Acute on chronic hypoxic and hypercapnic respiratory failure due to COPD, vent dependetn. Chronic MV with vent malfunctioned (totally turned off- confirmed by home respiratory  company).  Progressive VAP/HAP since discharged from [facility].  [facility] with evidence of pneumonia. Patient's MV malfunctioned at home/shut off. Pneumonia progressed causing hypoxic/hypercarbic respiratory failure. BCID showed staph and 2 of 4 BCx growing Staph Hominis. On day 5 of antibiotic therapy with Cefepime. Procal trending downward 0.45>0.98>0.65>pending - Cont Cefepime (Day 5) transition to oral agent when possible.  - New vent delivered to house/TOC consulted to help arrange transition back home - con't lung protective vent strategies - con't prednisone 20 mg daily for a week depending on response. Cont a;buterol nebs, arformoterol, bodesonide and revefenacin nebs. -VAP prevention protocol - Avoid sedation if possible  HTN: Continues to have elevated BP on amlodipine 10 mg and PRN labetalol. She remains in sinus tachycardia, but afebrile. Will likely need to add a second agent today.  - Cont amlodipine and prn Labetalol.  - will consider adding HCTZ 12.5 mg today.  Continuous telemetry  Hypothyroidism -con't synthroid  At risk malnutrition Generalized weakness: -Continue tube feeds through PEG - PT/OT consulted for physical therapy.    Best practice (evaluated daily)  Diet: Tube feeds resumed through PEG Pain/Anxiety/Delirium protocol (if indicated): In place VAP protocol (if indicated): In place DVT prophylaxis: Subcu heparin GI prophylaxis: PPI Glucose control: Monitor Mobility: Bedrest Disposition: ICU  Goals of Care:  Last date of multidisciplinary goals of care discussion: Pending Family and staff present: Pending Summary of discussion: Pending Follow up goals of care discussion due: Pending Code Status: Full    Labs   CBC: Recent Labs  Lab 06/03/20 0036 06/03/20 0052 06/03/20 0340 06/05/20 0800  WBC 24.2*  --  19.1* 14.9*  HGB 10.3* 11.2* 10.0* 10.5*  HCT 35.1* 33.0* 32.5* 35.9*  MCV 97.0  --  94.2 95.5  PLT 326  --   284 255    Basic Metabolic Panel: Recent Labs  Lab 06/03/20 0036 06/03/20 0052 06/03/20 0340 06/03/20 1751 06/04/20 0323 06/05/20 0800  NA 139 139  --   --   --  142  K 4.4 4.4  --   --   --  3.3*  CL 98  --   --   --   --  99  CO2 28  --   --   --   --  32  GLUCOSE 158*  --   --   --   --  165*  BUN 41*  --   --   --   --  25*  CREATININE 0.60  --  0.50  --   --  0.31*  CALCIUM 9.1  --   --   --   --  8.9  MG  --   --  2.4 2.1 2.0  --   PHOS  --   --  4.9* 4.2 3.4  --    GFR: Estimated Creatinine Clearance: 67.8 mL/min (A) (by C-G formula based on SCr of 0.31 mg/dL (L)). Recent Labs  Lab 06/03/20 0035 06/03/20 0036 06/03/20 0340 06/04/20 0323 06/05/20 0151 06/05/20 0800  PROCALCITON  --   --  0.45 0.98 0.65  --   WBC  --  24.2* 19.1*  --   --  14.9*  LATICACIDVEN 2.3*  --   --   --   --   --  Liver Function Tests: Recent Labs  Lab 06/03/20 0036 06/05/20 0800  AST 149* 20  ALT 88* 49*  ALKPHOS 163* 133*  BILITOT 0.7 0.4  PROT 6.1* 5.8*  ALBUMIN 2.7* 2.4*   No results for input(s): LIPASE, AMYLASE in the last 168 hours. No results for input(s): AMMONIA in the last 168 hours.  ABG    Component Value Date/Time   PHART 7.495 (H) 05/19/2020 1055   PCO2ART 47.1 05/19/2020 1055   PO2ART 155 (H) 05/19/2020 1055   HCO3 33.0 (H) 06/03/2020 0052   TCO2 35 (H) 06/03/2020 0052   O2SAT 99.0 06/03/2020 0052     Coagulation Profile: No results for input(s): INR, PROTIME in the last 168 hours.  Cardiac Enzymes: No results for input(s): CKTOTAL, CKMB, CKMBINDEX, TROPONINI in the last 168 hours.  HbA1C: Hgb A1c MFr Bld  Date/Time Value Ref Range Status  02/26/2020 05:18 PM 5.4 4.8 - 5.6 % Final    Comment:    (NOTE) Pre diabetes:          5.7%-6.4%  Diabetes:              >6.4%  Glycemic control for   <7.0% adults with diabetes   12/22/2018 04:37 AM 5.5 4.8 - 5.6 % Final    Comment:    (NOTE) Pre diabetes:          5.7%-6.4% Diabetes:               >6.4% Glycemic control for   <7.0% adults with diabetes     CBG: Recent Labs  Lab 06/05/20 1127 06/05/20 1528 06/05/20 1915 06/05/20 2308 06/06/20 0309  GLUCAP 144* 139* 147* 146* 145*

## 2020-06-06 NOTE — Progress Notes (Signed)
   06/06/20 0809  Vent [facility]  Invasive or Noninvasive Invasive  Adult Vent Y  Tracheostomy Shiley Legacy 6 mm Cuffed  Placement Date/Time: 06/03/20 0039   Inserted prior to hospital arrival?: Yes  Brand: Shiley Legacy  Size (mm): 6 mm  Style: Cuffed  Status Secured  Site Assessment Clean;Dry  Ties Assessment Dry;Secure;Clean  Tracheostomy Equipment at bedside Yes and checklist posted at head of bed  Adult Ventilator Settings  Vent Type Servo U  Humidity HME  Vent Mode PRVC  Set Rate 16 bmp  FiO2 (%) 50 %  I Time 0.9 Sec(s)  PEEP 5 cmH20  Vt Set 420 mL  Adult Ventilator Measurements  Peak Airway Pressure 20 L/min  Mean Airway Pressure 9 cmH20  Plateau Pressure 21 cmH20  Resp Rate Spontaneous 5 br/min  Resp Rate Total 21 br/min  Exhaled Vt 396 mL  Measured Ve 8.1 mL  SpO2 96 %  Auto PEEP 0 cmH20  Total PEEP 5 cmH20  I:E Ratio Measured 1:2.7  Adult Ventilator Alarms  Alarms On Y  Ve High Alarm 18 L/min  Ve Low Alarm 3 L/min  Resp Rate High Alarm 38 br/min  Resp Rate Low Alarm 8  PEEP Low Alarm 3 cmH2O  Press High Alarm 50 cmH2O  VAP Prevention  HOB> 30 Degrees Y  Daily Weaning Assessment  Daily Assessment of Readiness to Wean Wean protocol criteria not met  Reason not met  (Home Vent)  Breath Sounds  Bilateral Breath Sounds Diminished

## 2020-06-06 NOTE — Progress Notes (Signed)
Hypoglycemic Event  CBG: 42  Treatment: 1/2 amp D50% and gave bolus osmolite as scheduled  Symptoms: lethargy  Follow-up CBG: Time: 1942 CBG Result: 115  Possible Reasons for Event: TF changed from continuous to bolus as she takes at home, insulin still scheduled q4.   Comments/MD notified: Raynald Kemp

## 2020-06-06 NOTE — Progress Notes (Signed)
[facility] PCCM Afternoon rounds  Failed use of home vent today: high peak pressures, immediate tachycardia, dyspnea We took her off of it after about an hour  I suspect she has suffered a clinical setback from HCAP/COPD exacerbation and her condition is worse now than when she left [facility] last week.  She will need several days to stabilize with prednisone, bronchodilators prior to considering letting her go home on her home ventilator.   Currently not medically stable for discharge.  Heber Leonard, MD [facility] Pager: [phone] Cell: [phone] If no response, call [phone]

## 2020-06-06 NOTE — TOC Progression Note (Addendum)
Transition of Care [facility]) - Progression Note    Patient Details  Name: Kathleen Cameron MRN: [id number] Date of Birth: [birth year]  [facility] CM/SW Contact  Lawerance Sabal, RN Phone Number: 06/06/2020, 12:00 PM  Clinical Narrative:    Spouse has brought home vent to unit for patient to use prior to DC home. Spouse provided me with [employer] [facility], Peggye Fothergill number [phone]. I spoke w Onalee Hua and he confirms plans to meet patient at home on day of DC to ensure set up of home vent.  Spoke w Rob Medlin RN [place], [phone] who is starting insurance authorization for [facility] RN to cover 8-12 hour daily shifts in the home. Rob informs with patient's [facility] insurance authorization usually has a 14 day turn around time. If we were to pursue intermitted [facility] RN services with biweekly visits this would negate the request for the nurse to be in the home daily through the insurance. I spoke w Dr Henrene Pastor re this and he is in agreement that the patient is more appropriate for the daily nurse and should remain in the hospital until this is approved. Orders, note of medical necessity (received from [facility]), and clinicals signed and faxed back for home nurse.    Spoke to Jet at [employer] to assess for readmittance to [facility] prior to discharge home, she will assess however feels readmittance to [facility] is unlikely.      Expected Discharge Plan: Home w Home Health Services Barriers to Discharge: Continued Medical Work up  Expected Discharge Plan and Services Expected Discharge Plan: Home w Home Health Services   Discharge Planning Services: CM Consult Post Acute Care Choice: Home Health,Durable Medical Equipment Living arrangements for the past 2 months: Apartment                                       Social Determinants of Health (SDOH) Interventions    Readmission Risk Interventions Readmission Risk Prevention Plan 02/02/2020  Transportation Screening Complete  PCP  or Specialist Appt within 3-5 Days Complete  HRI or Home Care Consult Complete  Social Work Consult for Recovery Care Planning/Counseling Complete  Palliative Care Screening Not Applicable  Medication Review Oceanographer) Complete  Some recent data might be hidden

## 2020-06-06 NOTE — Progress Notes (Signed)
   06/06/20 1204  Vent [facility]  Invasive or Noninvasive Invasive  Adult Vent Y  Tracheostomy Shiley Legacy 6 mm Cuffed  Placement Date/Time: 06/03/20 0039   Inserted prior to hospital arrival?: Yes  Brand: Shiley Legacy  Size (mm): 6 mm  Style: Cuffed  Status Secured  Site Assessment Clean;Dry  Ties Assessment Clean;Dry;Secure  Tracheostomy Equipment at bedside Yes and checklist posted at head of bed  Adult Ventilator Settings  Vent Type Servo U  Humidity HME  Vent Mode PRVC  Set Rate 16 bmp  FiO2 (%) 50 %  I Time 0.9 Sec(s)  PEEP 5 cmH20  Vt Set 420 mL  Adult Ventilator Measurements  Peak Airway Pressure 24 L/min  Mean Airway Pressure 10 cmH20  Plateau Pressure 21 cmH20  Resp Rate Spontaneous 5 br/min  Resp Rate Total 21 br/min  Exhaled Vt 453 mL  Measured Ve 9.4 mL  SpO2 100 %  Auto PEEP 0 cmH20  Total PEEP 5 cmH20  I:E Ratio Measured 1:2.4  Adult Ventilator Alarms  Alarms On Y  Ve High Alarm 18 L/min  Ve Low Alarm 3 L/min  Resp Rate High Alarm 38 br/min  Resp Rate Low Alarm 8  PEEP Low Alarm 3 cmH2O  Press High Alarm 50 cmH2O  VAP Prevention  HOB> 30 Degrees Y  Breath Sounds  Bilateral Breath Sounds Diminished  Airway Suctioning/Secretions  Suction Type Tracheal  Suction Device  Catheter  Secretion Amount Small  Secretion Color Tan;Pink tinged  Secretion Consistency Thick  Suction Tolerance Tolerated well  Suctioning Adverse Effects None

## 2020-06-06 NOTE — Progress Notes (Addendum)
eLink Physician-Brief Progress Note Patient Name: Kathleen Cameron DOB: [birth year] MRN: [id number]   Date of Service  06/06/2020  HPI/Events of Note  Hypoglycemia. Patient is on coverage insulin but is not a diabetic patient, and is now getting bolus feeds rather than continuous enteral feeding.  eICU Interventions  Sq Insulin coverage orders discontinued.        [name] Ogan 06/06/2020, 8:28 PM

## 2020-06-07 DIAGNOSIS — J9622 Acute and chronic respiratory failure with hypercapnia: Secondary | ICD-10-CM | POA: Diagnosis not present

## 2020-06-07 DIAGNOSIS — J9621 Acute and chronic respiratory failure with hypoxia: Secondary | ICD-10-CM | POA: Diagnosis not present

## 2020-06-07 LAB — CBC
HCT: 34.4 % — ABNORMAL LOW (ref 36.0–46.0)
Hemoglobin: 10.6 g/dL — ABNORMAL LOW (ref 12.0–15.0)
MCH: 28.6 pg (ref 26.0–34.0)
MCHC: 30.8 g/dL (ref 30.0–36.0)
MCV: 93 fL (ref 80.0–100.0)
Platelets: 281 10*3/uL (ref 150–400)
RBC: 3.7 MIL/uL — ABNORMAL LOW (ref 3.87–5.11)
RDW: 17.7 % — ABNORMAL HIGH (ref 11.5–15.5)
WBC: 15.6 10*3/uL — ABNORMAL HIGH (ref 4.0–10.5)
nRBC: 0 % (ref 0.0–0.2)

## 2020-06-07 LAB — MAGNESIUM: Magnesium: 1.9 mg/dL (ref 1.7–2.4)

## 2020-06-07 LAB — BASIC METABOLIC PANEL
Anion gap: 8 (ref 5–15)
BUN: 21 mg/dL — ABNORMAL HIGH (ref 6–20)
CO2: 37 mmol/L — ABNORMAL HIGH (ref 22–32)
Calcium: 8.8 mg/dL — ABNORMAL LOW (ref 8.9–10.3)
Chloride: 96 mmol/L — ABNORMAL LOW (ref 98–111)
Creatinine, Ser: 0.38 mg/dL — ABNORMAL LOW (ref 0.44–1.00)
GFR, Estimated: >60 mL/min (ref >60)
Glucose, Bld: 217 mg/dL — ABNORMAL HIGH (ref 70–99)
Potassium: 4 mmol/L (ref 3.5–5.1)
Sodium: 141 mmol/L (ref 135–145)

## 2020-06-07 LAB — GLUCOSE, CAPILLARY
Glucose-Capillary: 42 mg/dL — CL (ref 70–99)
Glucose-Capillary: 141 mg/dL — ABNORMAL HIGH (ref 70–99)
Glucose-Capillary: 121 mg/dL — ABNORMAL HIGH (ref 70–99)
Glucose-Capillary: 175 mg/dL — ABNORMAL HIGH (ref 70–99)
Glucose-Capillary: 191 mg/dL — ABNORMAL HIGH (ref 70–99)
Glucose-Capillary: 171 mg/dL — ABNORMAL HIGH (ref 70–99)
Glucose-Capillary: 124 mg/dL — ABNORMAL HIGH (ref 70–99)

## 2020-06-07 LAB — PHOSPHORUS: Phosphorus: 2 mg/dL — ABNORMAL LOW (ref 2.5–4.6)

## 2020-06-07 NOTE — Evaluation (Signed)
Physical Therapy Evaluation Patient Details Name: Kathleen Cameron MRN: [id number] DOB: [birth year] Today's Date: 06/07/2020   History of Present Illness  55 y.o. female admitted 2/13 from home with respiratory distress due to home vent malfunction after same day D/C from [facility] with home vent. PMHx pt with [facility] admission 11/6 with trach 03/02/20 ant transition to [facility] 11/18-2/12, 04/25/19 Gtube placed, COPD, hypertension, depression/anxiety, HLD, shingles infection,  Clinical Impression  Pt pleasant, smiling in response to statements and recalling prior admission and working with this therapist. Pt able to assist with moving bil LE in bed and transition to EOB. Mobility cleared with Dr.Coe prior to session with request for HR limit of 140. Pt wanting to progress function as much as possible but demonstrates generalized weakness and deconditioning with cardiopulmonary limitations on chronic vent. Pt will benefit from trial of therapy to maximize strength, transfers and balance to decrease burden of care.   HR 120 at rest with drop to 102 with sitting EOB and back to 124 end of session BP 146/105 after sitting SpO2 95% on 50% FiO2 PRVC    Follow Up Recommendations SNF;Supervision/Assistance - 24 hour    Equipment Recommendations  None recommended by PT    Recommendations for Other Services       Precautions / Restrictions Precautions Precautions: Fall Precaution Comments: trach, vent Restrictions Weight Bearing Restrictions: No      Mobility  Bed Mobility Overal bed mobility: Needs Assistance Bed Mobility: Supine to Sit;Sit to Supine Rolling: Max assist   Supine to sit: Max assist Sit to supine: Max assist   General bed mobility comments: pt able to assist to moving legs toward EOB and max assist to fully transition pelvis toward EOB and elevate trunk from surface. Return to supine with max assist for bil LE and trunk control. Total assist to slide up and over in bed with  total assist to reposition in semi chair position end of session    Transfers                 General transfer comment: unable  Ambulation/Gait                Stairs            Wheelchair Mobility    Modified Rankin (Stroke Patients Only)       Balance Overall balance assessment: Needs assistance   Sitting balance-Leahy Scale: Poor Sitting balance - Comments: mod assist for sitting balance with posterior left lean and cues for midline with pt fatigued after 2 min                                     Pertinent Vitals/Pain Pain Assessment: Faces Faces Pain Scale: Hurts little more Pain Location: grimace with bil UE movement, reports chest discomfort wanting to be suctioned Pain Descriptors / Indicators: Grimacing Pain Intervention(s): Limited activity within patient's tolerance;Repositioned    Home Living Family/patient expects to be discharged to:: Private residence Living Arrangements: Spouse/significant other Available Help at Discharge: Family;Available 24 hours/day Type of Home: Apartment Home Access: Stairs to enter   Entrance Stairs-Number of Steps: 1 Home Layout: One level Home Equipment: Grab bars - tub/shower;Walker - 4 wheels;Transport chair;Bedside commode;Hospital bed;Wheelchair - Careers adviser (comment) Additional Comments: hoyer lift    Prior Function Level of Independence: Needs assistance   Gait / Transfers Assistance Needed: hoyer lift at home to get to [facility], assist  to EOB  ADL's / Homemaking Assistance Needed: assist for all ADLs  Comments: limited mobility at [facility] to just getting to EOB and chair had just gotten home from [facility]. PLOF and setup provided via spouse on phone     Hand Dominance   Dominant Hand: Right    Extremity/Trunk Assessment   Upper Extremity Assessment Upper Extremity Assessment: Defer to OT evaluation RUE Deficits / Details: AROM very limited but active  movement seen in all muscle groups.  PROM limited in shoulder to 100 degrees due to pain. Strength in general 2/5 shoulders/biceps/triceps and 3/5 in hand. RUE: Unable to fully assess due to pain RUE Sensation: WNL RUE Coordination: decreased fine motor;decreased gross motor LUE Deficits / Details: AROM limited in all muscle groups. PROM limited in shoulder to 100 degrees.  Strength 2/5 throughout except hand 3/5, grip 4/5. LUE: Unable to fully assess due to pain LUE Sensation: WNL LUE Coordination: decreased fine motor;decreased gross motor    Lower Extremity Assessment Lower Extremity Assessment: Generalized weakness;RLE deficits/detail;LLE deficits/detail RLE Deficits / Details: grossly 2/5 able to perform heel slides and SLR with assist LLE Deficits / Details: grossly 2/5 able to perform heel slides and SLR with assist       Communication   Communication: Tracheostomy  Cognition Arousal/Alertness: Awake/alert Behavior During Therapy: WFL for tasks assessed/performed Overall Cognitive Status: Difficult to assess                                 General Comments: pt nodding appropriately to statements and smiling with limited ability to communicate due to trach/vent      General Comments General comments (skin integrity, edema, etc.): Balance not tested.    Exercises General Exercises - Upper Extremity Shoulder Flexion: AAROM;10 reps;Both;Supine Elbow Flexion: AAROM;Both;10 reps;Supine Elbow Extension: AAROM;Both;10 reps;Supine Wrist Flexion: AAROM;Both;10 reps;Supine Wrist Extension: AAROM;Both;10 reps;Supine Digit Composite Flexion: AROM;Both;10 reps;Supine Composite Extension: Both;10 reps;Supine;AROM General Exercises - Lower Extremity Heel Slides: AAROM;Both;10 reps;Supine Straight Leg Raises: AAROM;Both;Supine;10 reps   Assessment/Plan    PT Assessment Patient needs continued PT services  PT Problem List Decreased strength;Decreased  mobility;Decreased range of motion;Decreased activity tolerance;Decreased balance;Decreased cognition;Pain;Cardiopulmonary status limiting activity       PT Treatment Interventions Therapeutic exercise;Functional mobility training;Therapeutic activities;Patient/family education;Neuromuscular re-education;Balance training    PT Goals (Current goals can be found in the Care Plan section)  Acute Rehab PT Goals Patient Stated Goal: agreeable to work toward sitting and moving PT Goal Formulation: With patient Time For Goal Achievement: 06/21/20 Potential to Achieve Goals: Fair    Frequency Min 2X/week   Barriers to discharge Decreased caregiver support      Co-evaluation               AM-PAC PT "6 Clicks" Mobility  Outcome Measure Help needed turning from your back to your side while in a flat bed without using bedrails?: A Lot Help needed moving from lying on your back to sitting on the side of a flat bed without using bedrails?: A Lot Help needed moving to and from a bed to a chair (including a wheelchair)?: Total Help needed standing up from a chair using your arms (e.g., wheelchair or bedside chair)?: Total Help needed to walk in hospital room?: Total Help needed climbing 3-5 steps with a railing? : Total 6 Click Score: 8    End of Session   Activity Tolerance: Patient tolerated  treatment well Patient left: in bed;with call bell/phone within reach;with bed alarm set Nurse Communication: Mobility status PT Visit Diagnosis: Other abnormalities of gait and mobility (R26.89);Difficulty in walking, not elsewhere classified (R26.2);Muscle weakness (generalized) (M62.81)    Time: 4315-4008 PT Time Calculation (min) (ACUTE ONLY): 22 min   Charges:   PT Evaluation $PT Eval High Complexity: 1 High          Morayo Leven P, PT Acute Rehabilitation Services Pager: [phone] Office: [phone]   Zoelle Markus B Roselia Snipe 06/07/2020, 11:11 AM

## 2020-06-07 NOTE — Progress Notes (Signed)
NAME:  Kathleen Cameron, MRN:  [id number], DOB:  1966-02-11, LOS: 4 ADMISSION DATE:  06/03/2020, CONSULTATION DATE:  06/02/2020 REFERRING MD:  Dr. Pilar Plate, CHIEF COMPLAINT: Acute respiratory distress  Brief History:  55 year old female presented from home via EMS with complaints of acute respiratory distress.  [facility]  PCCM consulted for further management and admission  History of Present Illness:  Kathleen Cameron is a 55 y.o. with a past medical history significant for seizure disorder, hypertension, severe COPD, acute on chronic hypoxic and hypercapnic respiratory failure, and anxiety who presented to the ED via EMS with acute on chronic hypoxic and hypercapnic respiratory failure.  Of note patient was recently discharged from [facility] 2 days prior to admission.  Primary history obtained from chart review as patient is currently on ventilator through chronic trach.  It appears patient was treated at this facility November 2021 for acute on chronic hypoxic respiratory failure she required endotracheal intubation and eventual tracheostomy due to difficulty weaning 03/02/2020.  On 11/18 [facility]  Upon discharge home per chart review it appears patient's home ventilator malfunctioned, which resulted in respiratory failure and need for bag mask ventilation.  On arrival to ED patient was placed on ventilator with home vent settings with no further acute respiratory compromise.  PCCM was called for further management and admission.   Past Medical History:  Seizure disorder Hypertension Severe COPD Acute on chronic hypoxic and hypercapnic respiratory failure Anxiety  Significant Hospital Events:  Admitted 2/13  Consults:  TOC, PT/OT  Procedures:  n/a  Significant Diagnostic Tests:  Chest x-ray 2/13 > worsening left perihilar airspace disease with increased patchy opacities and bronchial thickening suspicious for  worsening pneumonia  Micro Data:  Covid 2/13 > negative  2/13 BCID - staph species  Blood culture 2/13 > GPC in clusters (Staph Hominis) (2x), NG 1d (2x) Sputum culture 2/13 > RARE WBC PRESENT,BOTH PMN AND MONONUCLEAR, FEW GRAM, POSITIVE COCCI IN PAIRS IN CHAINS, RARE GRAM NEGATIVE RODS  MRSA PCR 2/13 > negative    Antimicrobials:  Cefepime 2/13 > Vancomycin 2/13 - 2/13  Interim History / Subjective:   Failed trial of home ventilator yesterday and became hypoglycemic on SSI. It was discontinued.   On evaluation today, patient is resting in bed with trach in place. She is awake and following commands. She denies any complaints but does periodically need to be suctioned.   Objective   Blood pressure (!) 148/100, pulse (!) 114, temperature 98.5 F (36.9 C), temperature source Oral, resp. rate 17, height 5\' 4"  (1.626 m), weight 55.1 kg, SpO2 95 %.    Vent Mode: PRVC FiO2 (%):  [50 %] 50 % Set Rate:  [16 bmp] 16 bmp Vt Set:  [420 mL] 420 mL PEEP:  [5 cmH20] 5 cmH20 Plateau Pressure:  [21 cmH20] 21 cmH20   Intake/Output Summary (Last 24 hours) at 06/07/2020 0559 Last data filed at 06/07/2020 0200 Gross per 24 hour  Intake 1696.46 ml  Output 3101 ml  Net -1404.54 ml   Filed Weights   06/05/20 0420 06/06/20 0318 06/07/20 0500  Weight: 57.9 kg 55.4 kg 55.1 kg    Examination: General: chronically ill appearing woman laying in bed trached on MV HEENT: [place]/AT, oral mucosa moist Neck: Some tan/pink secretions Neuro: alert and oriented and following commands CV: S1S2, RRR PULM: Patient had some rales that improved with suctioning, no rhonchi appreciable.  GI: soft, NT Extremities: no cyanosis or edema  LDA:  Foley (2d) Trach (26mm cuffed) (2d) Peripheral IV - Lt wrist (2d), Lf FA (2d), Rt FA (2d)  Resolved Hospital Problem list   Severe sepsis  Assessment & Plan:   Acute on chronic hypoxic and hypercapnic respiratory failure due to COPD, vent dependetn. Chronic MV with vent  malfunctioned (totally turned off- confirmed by home respiratory company).  Progressive VAP/HAP, Staph Hominis Patient discharged [facility] with evidence of pneumonia. Patient's MV malfunctioned at home/shut off. Pneumonia progressed causing hypoxic/hypercarbic respiratory failure. BCID showed staph and 2 of 4 BCx growing Staph Hominis. On day 6 of antibiotic therapy with Cefepime. Procal trending downward 0.45>0.98>0.65>0.41. She was unable to tolerate her home ventilator yesterday. Will try again before she is discharged. - Cont Cefepime (Day 6) transition to oral agent when possible. - con't lung protective vent strategies - con't prednisone 20 mg daily for a week depending on response. Cont abuterol nebs, arformoterol, bodesonide and revefenacin nebs. -VAP prevention protocol - Avoid sedation if possible - Repeat BCx pending  HTN: Continues to have elevated BP. Will titrate medications as needed.  - Cont amlodipine and prn Labetalol and HCTZ 12.5 mg .  - Continuous telemetry  Hypothyroidism -con't synthroid  At risk malnutrition Generalized weakness: -Continue tube feeds through PEG - PT/OT consulted for physical therapy.    Best practice (evaluated daily)  Diet: Tube feeds resumed through PEG Pain/Anxiety/Delirium protocol (if indicated): In place VAP protocol (if indicated): In place DVT prophylaxis: Subcu heparin GI prophylaxis: PPI Glucose control: Monitor Mobility: Bedrest Disposition: ICU  Goals of Care:  Last date of multidisciplinary goals of care discussion: Pending Family and staff present: Pending Summary of discussion: Pending Follow up goals of care discussion due: Pending Code Status: Full    Labs   CBC: Recent Labs  Lab 06/03/20 0036 06/03/20 0052 06/03/20 0340 06/05/20 0800 06/06/20 0707  WBC 24.2*  --  19.1* 14.9* 13.2*  HGB 10.3* 11.2* 10.0* 10.5* 10.5*  HCT 35.1* 33.0* 32.5* 35.9* 33.9*  MCV 97.0  --  94.2 95.5 92.6  PLT 326   --  284 255 252    Basic Metabolic Panel: Recent Labs  Lab 06/03/20 0036 06/03/20 0052 06/03/20 0340 06/03/20 1751 06/04/20 0323 06/05/20 0800 06/06/20 0707  NA 139 139  --   --   --  142 145  K 4.4 4.4  --   --   --  3.3* 4.3  CL 98  --   --   --   --  99 100  CO2 28  --   --   --   --  32 35*  GLUCOSE 158*  --   --   --   --  165* 156*  BUN 41*  --   --   --   --  25* 20  CREATININE 0.60  --  0.50  --   --  0.31* 0.31*  CALCIUM 9.1  --   --   --   --  8.9 9.0  MG  --   --  2.4 2.1 2.0  --   --   PHOS  --   --  4.9* 4.2 3.4  --   --    GFR: Estimated Creatinine Clearance: 67.8 mL/min (A) (by C-G formula based on SCr of 0.31 mg/dL (L)). Recent Labs  Lab 06/03/20 0035 06/03/20 0036 06/03/20 0340 06/04/20 0323 06/05/20 0151 06/05/20 0800 06/06/20 0707  PROCALCITON  --   --  0.45 0.98 0.65  --  0.41  WBC  --  24.2* 19.1*  --   --  14.9* 13.2*  LATICACIDVEN 2.3*  --   --   --   --   --   --     Liver Function Tests: Recent Labs  Lab 06/03/20 0036 06/05/20 0800 06/06/20 0707  AST 149* 20 30  ALT 88* 49* 42  ALKPHOS 163* 133* 130*  BILITOT 0.7 0.4 0.7  PROT 6.1* 5.8* 6.1*  ALBUMIN 2.7* 2.4* 2.2*   No results for input(s): LIPASE, AMYLASE in the last 168 hours. No results for input(s): AMMONIA in the last 168 hours.  ABG    Component Value Date/Time   PHART 7.495 (H) 05/19/2020 1055   PCO2ART 47.1 05/19/2020 1055   PO2ART 155 (H) 05/19/2020 1055   HCO3 33.0 (H) 06/03/2020 0052   TCO2 35 (H) 06/03/2020 0052   O2SAT 99.0 06/03/2020 0052     Coagulation Profile: No results for input(s): INR, PROTIME in the last 168 hours.  Cardiac Enzymes: No results for input(s): CKTOTAL, CKMB, CKMBINDEX, TROPONINI in the last 168 hours.  HbA1C: Hgb A1c MFr Bld  Date/Time Value Ref Range Status  02/26/2020 05:18 PM 5.4 4.8 - 5.6 % Final    Comment:    (NOTE) Pre diabetes:          5.7%-6.4%  Diabetes:              >6.4%  Glycemic control for   <7.0% adults  with diabetes   12/22/2018 04:37 AM 5.5 4.8 - 5.6 % Final    Comment:    (NOTE) Pre diabetes:          5.7%-6.4% Diabetes:              >6.4% Glycemic control for   <7.0% adults with diabetes     CBG: Recent Labs  Lab 06/06/20 1913 06/06/20 1920 06/06/20 1942 06/06/20 2307 06/07/20 0310  GLUCAP 47* 42* 115* 120* 141*   Chari Manning, D.O.  Internal Medicine Resident, PGY-2 [facility]  Pager: [phone] 7:51 AM, 06/07/2020

## 2020-06-07 NOTE — Evaluation (Signed)
Occupational Therapy Evaluation Patient Details Name: Kathleen Cameron MRN: [id number] DOB: [birth year] Today's Date: 06/07/2020    History of Present Illness 55 y.o. female admitted 2/13 from home with respiratory distress due to home vent malfunction after same day D/C from [facility] with home vent. PMHx pt with [facility] admission 11/6 with trach 03/02/20 ant transition to [facility] 11/18-2/12, 04/25/19 Gtube placed, COPD, hypertension, depression/anxiety, HLD, shingles infection,   Clinical Impression   Pt admitted with the above diagnosis and has the deficits listed below. Pt would benefit from a trial of continued OT to attempt to increase independence with basic adls at the bed level and EOB to decrease the burden of care on pt's husband.  Pt was in [facility] for months and only home for hours before vent malfunctioned and pt was back in hospital.  Feel this may be very overwhelming for the husband but if husband feel he is still set on taking pt home, recommend HHOT to promote pt to do adls at bed level to start at home.  Will continue to trial with a focus on adls and UE functioning.     Follow Up Recommendations  Home health OT;Supervision/Assistance - 24 hour    Equipment Recommendations  None recommended by OT    Recommendations for Other Services       Precautions / Restrictions Precautions Precaution Comments: watch vent settings and mutiple lines Restrictions Weight Bearing Restrictions: No      Mobility Bed Mobility Overal bed mobility: Needs Assistance Bed Mobility: Rolling Rolling: Max assist         General bed mobility comments: cues to use arms to reach for rails.  Pt anxious with mobility.    Transfers                 General transfer comment: No transfers performed. Pt with bedrest orders and BP/HR up.    Balance                                           ADL either performed or assessed with clinical judgement   ADL  Overall ADL's : Needs assistance/impaired Eating/Feeding: NPO   Grooming: Total assistance   Upper Body Bathing: Total assistance   Lower Body Bathing: Total assistance   Upper Body Dressing : Total assistance   Lower Body Dressing: Total assistance   Toilet Transfer: Total assistance   Toileting- Clothing Manipulation and Hygiene: Total assistance       Functional mobility during ADLs: Total assistance General ADL Comments: Pt currently with no OOB orders.  BP was 167/100 and HR resting at 125.  Bed eval only.     Vision   Additional Comments: needs further assessment. Pt could track around room and find items.     Perception Perception Perception Tested?: No   Praxis      Pertinent Vitals/Pain Pain Assessment: Faces Faces Pain Scale: Hurts a little bit Pain Location: with ROM of all joints in BUEs. Pain Descriptors / Indicators: Grimacing Pain Intervention(s): Limited activity within patient's tolerance;Monitored during session;Repositioned     Hand Dominance Right   Extremity/Trunk Assessment Upper Extremity Assessment Upper Extremity Assessment: RUE deficits/detail;LUE deficits/detail RUE Deficits / Details: AROM very limited but active movement seen in all muscle groups.  PROM limited in shoulder to 100 degrees due to pain. Strength in general 2/5 shoulders/biceps/triceps and 3/5 in hand. RUE: Unable  to fully assess due to pain RUE Sensation: WNL RUE Coordination: decreased fine motor;decreased gross motor LUE Deficits / Details: AROM limited in all muscle groups. PROM limited in shoulder to 100 degrees.  Strength 2/5 throughout except hand 3/5, grip 4/5. LUE: Unable to fully assess due to pain LUE Sensation: WNL LUE Coordination: decreased fine motor;decreased gross motor   Lower Extremity Assessment Lower Extremity Assessment: Defer to PT evaluation       Communication Communication Communication: Tracheostomy   Cognition Arousal/Alertness:  Awake/alert Behavior During Therapy: Anxious Overall Cognitive Status: Difficult to assess                                 General Comments: Pt was oriented to self, place and time mouthing answers to orientation questions.  Answered other STM questions appropriately.   General Comments  Balance not tested.    Exercises Exercises: General Upper Extremity General Exercises - Upper Extremity Shoulder Flexion: AAROM;10 reps;Both;Supine Elbow Flexion: AAROM;Both;10 reps;Supine Elbow Extension: AAROM;Both;10 reps;Supine Wrist Flexion: AAROM;Both;10 reps;Supine Wrist Extension: AAROM;Both;10 reps;Supine Digit Composite Flexion: AROM;Both;10 reps;Supine Composite Extension: Both;10 reps;Supine;AROM   Shoulder Instructions      Home Living Family/patient expects to be discharged to:: Private residence Living Arrangements: Spouse/significant other Available Help at Discharge: Family;Available 24 hours/day Type of Home: Apartment Home Access: Stairs to enter     Home Layout: One level     Bathroom Shower/Tub: Chief Strategy Officer: Standard Bathroom Accessibility: Yes   Home Equipment: Grab bars - tub/shower;Walker - 4 wheels;Transport chair;Bedside commode;Hospital bed   Additional Comments: per chart      Prior Functioning/Environment Level of Independence: Needs assistance        Comments: [facility].  Pt dependent with most mobility and all adls.        OT Problem List: Decreased strength;Decreased range of motion;Decreased activity tolerance;Impaired balance (sitting and/or standing);Decreased coordination;Decreased knowledge of use of DME or AE;Cardiopulmonary status limiting activity;Impaired UE functional use;Pain      OT Treatment/Interventions: Self-care/ADL training;Therapeutic activities;DME and/or AE instruction    OT Goals(Current goals can be found in the care plan  section) Acute Rehab OT Goals Patient Stated Goal: none stated OT Goal Formulation: With patient Time For Goal Achievement: 06/18/20 Potential to Achieve Goals: Fair ADL Goals Pt Will Perform Grooming: with mod assist;bed level Pt Will Perform Upper Body Bathing: with mod assist;bed level Additional ADL Goal #1: Pt will sit on EOB for 2 minutes with mod assist in prep for adls on the EOB. Additional ADL Goal #2: Pt will sit EOB and reach for items on bedside table with assist for balance only.  OT Frequency: Min 2X/week   Barriers to D/C:    husband at home to assist.       Co-evaluation              AM-PAC OT "6 Clicks" Daily Activity     Outcome Measure Help from another person eating meals?: Total Help from another person taking care of personal grooming?: Total Help from another person toileting, which includes using toliet, bedpan, or urinal?: Total Help from another person bathing (including washing, rinsing, drying)?: Total Help from another person to put on and taking off regular upper body clothing?: Total Help from another person to put on and taking off regular lower body clothing?: Total 6 Click Score: 6  End of Session Equipment Utilized During Treatment: Other (comment) (vent) Nurse Communication: Mobility status  Activity Tolerance: Patient limited by pain;Patient limited by fatigue Patient left: in bed;with call bell/phone within reach  OT Visit Diagnosis: Muscle weakness (generalized) (M62.81);Pain                Time: 1517-6160 OT Time Calculation (min): 12 min Charges:  OT General Charges $OT Visit: 1 Visit OT Evaluation $OT Eval Moderate Complexity: 1 [address] OTR/L [phone]  Hope Budds 06/07/2020, 9:35 AM

## 2020-06-07 NOTE — Progress Notes (Signed)
12 lead EKG performed d/t c/f abnormal rhythm.  3 captured; 1 showing accelerated junctional, 2 showing acute stemi, however artifact present.  Brought to CCM MD for review.  Per CCM MD, baseline wander, not true stemi or arrhythmia.

## 2020-06-08 ENCOUNTER — Inpatient Hospital Stay (HOSPITAL_COMMUNITY): Payer: No Typology Code available for payment source

## 2020-06-08 DIAGNOSIS — J9622 Acute and chronic respiratory failure with hypercapnia: Secondary | ICD-10-CM | POA: Diagnosis not present

## 2020-06-08 DIAGNOSIS — J9621 Acute and chronic respiratory failure with hypoxia: Secondary | ICD-10-CM | POA: Diagnosis not present

## 2020-06-08 LAB — COMPREHENSIVE METABOLIC PANEL
ALT: 48 U/L — ABNORMAL HIGH (ref 0–44)
AST: 35 U/L (ref 15–41)
Albumin: 2.2 g/dL — ABNORMAL LOW (ref 3.5–5.0)
Alkaline Phosphatase: 98 U/L (ref 38–126)
Anion gap: 9 (ref 5–15)
BUN: 23 mg/dL — ABNORMAL HIGH (ref 6–20)
CO2: 36 mmol/L — ABNORMAL HIGH (ref 22–32)
Calcium: 8.6 mg/dL — ABNORMAL LOW (ref 8.9–10.3)
Chloride: 95 mmol/L — ABNORMAL LOW (ref 98–111)
Creatinine, Ser: 0.3 mg/dL — ABNORMAL LOW (ref 0.44–1.00)
GFR, Estimated: >60 mL/min (ref >60)
Glucose, Bld: 142 mg/dL — ABNORMAL HIGH (ref 70–99)
Potassium: 4.2 mmol/L (ref 3.5–5.1)
Sodium: 140 mmol/L (ref 135–145)
Total Bilirubin: 0.6 mg/dL (ref 0.3–1.2)
Total Protein: 5.3 g/dL — ABNORMAL LOW (ref 6.5–8.1)

## 2020-06-08 LAB — GLUCOSE, CAPILLARY
Glucose-Capillary: 173 mg/dL — ABNORMAL HIGH (ref 70–99)
Glucose-Capillary: 119 mg/dL — ABNORMAL HIGH (ref 70–99)
Glucose-Capillary: 146 mg/dL — ABNORMAL HIGH (ref 70–99)
Glucose-Capillary: 288 mg/dL — ABNORMAL HIGH (ref 70–99)
Glucose-Capillary: 200 mg/dL — ABNORMAL HIGH (ref 70–99)
Glucose-Capillary: 140 mg/dL — ABNORMAL HIGH (ref 70–99)
Glucose-Capillary: 73 mg/dL (ref 70–99)
Glucose-Capillary: 122 mg/dL — ABNORMAL HIGH (ref 70–99)

## 2020-06-08 LAB — CBC
HCT: 31.2 % — ABNORMAL LOW (ref 36.0–46.0)
Hemoglobin: 9.5 g/dL — ABNORMAL LOW (ref 12.0–15.0)
MCH: 28.4 pg (ref 26.0–34.0)
MCHC: 30.4 g/dL (ref 30.0–36.0)
MCV: 93.4 fL (ref 80.0–100.0)
Platelets: 211 10*3/uL (ref 150–400)
RBC: 3.34 MIL/uL — ABNORMAL LOW (ref 3.87–5.11)
RDW: 17.7 % — ABNORMAL HIGH (ref 11.5–15.5)
WBC: 11 10*3/uL — ABNORMAL HIGH (ref 4.0–10.5)
nRBC: 0 % (ref 0.0–0.2)

## 2020-06-08 LAB — CULTURE, BLOOD (ROUTINE X 2)
Culture: NO GROWTH
Special Requests: ADEQUATE

## 2020-06-08 MED ORDER — INSULIN ASPART 100 UNIT/ML ~~LOC~~ SOLN
4.0000 [IU] | Freq: Once | SUBCUTANEOUS | Status: AC
  Administered 2020-06-08: 4 [IU] via SUBCUTANEOUS

## 2020-06-08 NOTE — Progress Notes (Signed)
NAME:  Kathleen Cameron, MRN:  [id number], DOB:  1966/01/13, LOS: 5 ADMISSION DATE:  06/03/2020, CONSULTATION DATE:  06/02/2020 REFERRING MD:  Dr. Pilar Plate, CHIEF COMPLAINT: Acute respiratory distress  Brief History:  55 year old female presented from home via EMS with complaints of acute respiratory distress.  [facility]  PCCM consulted for further management and admission  History of Present Illness:  Kathleen Cameron is a 55 y.o. with a past medical history significant for seizure disorder, hypertension, severe COPD, acute on chronic hypoxic and hypercapnic respiratory failure, and anxiety who presented to the ED via EMS with acute on chronic hypoxic and hypercapnic respiratory failure.  Of note patient was recently discharged from [facility] 2 days prior to admission.  Primary history obtained from chart review as patient is currently on ventilator through chronic trach.  It appears patient was treated at this facility November 2021 for acute on chronic hypoxic respiratory failure she required endotracheal intubation and eventual tracheostomy due to difficulty weaning 03/02/2020.  On 11/18 [facility]  Upon discharge home per chart review it appears patient's home ventilator malfunctioned, which resulted in respiratory failure and need for bag mask ventilation.  On arrival to ED patient was placed on ventilator with home vent settings with no further acute respiratory compromise.  PCCM was called for further management and admission.   Past Medical History:  Seizure disorder Hypertension Severe COPD Acute on chronic hypoxic and hypercapnic respiratory failure Anxiety  Significant Hospital Events:  Admitted 2/13  Consults:  TOC, PT/OT  Procedures:  n/a  Significant Diagnostic Tests:  Chest x-ray 2/13 > worsening left perihilar airspace disease with increased patchy opacities and bronchial thickening suspicious for  worsening pneumonia  Micro Data:  Covid 2/13 > negative  2/13 BCID - staph species  Blood culture 2/13 > GPC in clusters (Staph Hominis) (2x), NG 1d (2x) Sputum culture 2/13 > RARE WBC PRESENT,BOTH PMN AND MONONUCLEAR, FEW GRAM, POSITIVE COCCI IN PAIRS IN CHAINS, RARE GRAM NEGATIVE RODS  MRSA PCR 2/13 > negative  BCx 2/16 - NG at 24 hours Antimicrobials:  Cefepime 2/13 > Vancomycin 2/13 - 2/13  Interim History / Subjective:   No overnight events  On evaluation today, patient is resting in bed with trach in place. She is awake and following commands but sleepy this morning.   Objective   Blood pressure 125/74, pulse (!) 103, temperature 98.6 F (37 C), temperature source Oral, resp. rate 18, height 5\' 4"  (1.626 m), weight 52.7 kg, SpO2 99 %.    Vent Mode: PRVC FiO2 (%):  [50 %] 50 % Set Rate:  [16 bmp] 16 bmp Vt Set:  [420 mL] 420 mL PEEP:  [5 cmH20] 5 cmH20 Plateau Pressure:  [22 cmH20] 22 cmH20   Intake/Output Summary (Last 24 hours) at 06/08/2020 0604 Last data filed at 06/07/2020 2000 Gross per 24 hour  Intake 2338.52 ml  Output 750 ml  Net 1588.52 ml   Filed Weights   06/06/20 0318 06/07/20 0500 06/08/20 0314  Weight: 55.4 kg 55.1 kg 52.7 kg    Examination: General: chronically ill appearing woman laying in bed trached on MV HEENT: [place]/AT, oral mucosa moist Neck: les secretions today Neuro: alert and oriented and following commands CV: S1S2, RRR  PULM: Patient had some rales that improved with suctioning, no rhonchi appreciable.  GI: soft, NT Extremities: no cyanosis or edema   Resolved Hospital Problem list   Severe sepsis  Assessment & Plan:  Acute on chronic hypoxic and hypercapnic respiratory failure due to COPD, vent dependetn. Chronic MV with vent malfunctioned (totally turned off- confirmed by home respiratory company).  Progressive VAP/HAP, Staph Hominis Patient discharged from [facility] with evidence of pneumonia. Patient's MV malfunctioned  at home/shut off. Pneumonia progressed causing hypoxic/hypercarbic respiratory failure. BCID showed staph and 2 of 4 BCx growing Staph Hominis. On day 7 of antibiotic therapy with Cefepime. Procal trending downward 0.45>0.98>0.65>0.41. She was unable to tolerate her home ventilator 2 days ago. Will try again before she is discharged. - Cont Cefepime for a total fo 7 days for HAP - con't lung protective vent strategies - con't prednisone 20 mg daily for a week depending on response. Cont abuterol nebs, arformoterol, bodesonide and revefenacin nebs. -VAP prevention protocol - Avoid sedation if possible - Repeat BCx negative at 1 day  HTN: Improved today - Cont amlodipine and prn Labetalol and HCTZ 12.5 mg .  - Continuous telemetry  Hypothyroidism -con't synthroid  At risk malnutrition Generalized weakness: -Continue tube feeds through PEG - PT/OT consulted for physical therapy.    Best practice (evaluated daily)  Diet: Tube feeds resumed through PEG Pain/Anxiety/Delirium protocol (if indicated): In place VAP protocol (if indicated): In place DVT prophylaxis: Subcu heparin GI prophylaxis: PPI Glucose control: Monitor Mobility: Bedrest Disposition: ICU  Goals of Care:  Last date of multidisciplinary goals of care discussion: Pending Family and staff present: Pending Summary of discussion: Pending Follow up goals of care discussion due: Pending Code Status: Full    Labs   CBC: Recent Labs  Lab 06/03/20 0036 06/03/20 0052 06/03/20 0340 06/05/20 0800 06/06/20 0707 06/07/20 0915  WBC 24.2*  --  19.1* 14.9* 13.2* 15.6*  HGB 10.3* 11.2* 10.0* 10.5* 10.5* 10.6*  HCT 35.1* 33.0* 32.5* 35.9* 33.9* 34.4*  MCV 97.0  --  94.2 95.5 92.6 93.0  PLT 326  --  284 255 252 281    Basic Metabolic Panel: Recent Labs  Lab 06/03/20 0036 06/03/20 0052 06/03/20 0340 06/03/20 1751 06/04/20 0323 06/05/20 0800 06/06/20 0707 06/07/20 0915 06/07/20 1330  NA 139 139  --   --   --   142 145 141  --   K 4.4 4.4  --   --   --  3.3* 4.3 4.0  --   CL 98  --   --   --   --  99 100 96*  --   CO2 28  --   --   --   --  32 35* 37*  --   GLUCOSE 158*  --   --   --   --  165* 156* 217*  --   BUN 41*  --   --   --   --  25* 20 21*  --   CREATININE 0.60  --  0.50  --   --  0.31* 0.31* 0.38*  --   CALCIUM 9.1  --   --   --   --  8.9 9.0 8.8*  --   MG  --   --  2.4 2.1 2.0  --   --   --  1.9  PHOS  --   --  4.9* 4.2 3.4  --   --   --  2.0*   GFR: Estimated Creatinine Clearance: 65.3 mL/min (A) (by C-G formula based on SCr of 0.38 mg/dL (L)). Recent Labs  Lab 06/03/20 0035 06/03/20 0036 06/03/20 0340 06/04/20 0323 06/05/20 0151 06/05/20 0800 06/06/20 0707 06/07/20  0915  PROCALCITON  --   --  0.45 0.98 0.65  --  0.41  --   WBC  --    < > 19.1*  --   --  14.9* 13.2* 15.6*  LATICACIDVEN 2.3*  --   --   --   --   --   --   --    < > = values in this interval not displayed.    Liver Function Tests: Recent Labs  Lab 06/03/20 0036 06/05/20 0800 06/06/20 0707  AST 149* 20 30  ALT 88* 49* 42  ALKPHOS 163* 133* 130*  BILITOT 0.7 0.4 0.7  PROT 6.1* 5.8* 6.1*  ALBUMIN 2.7* 2.4* 2.2*   No results for input(s): LIPASE, AMYLASE in the last 168 hours. No results for input(s): AMMONIA in the last 168 hours.  ABG    Component Value Date/Time   PHART 7.495 (H) 05/19/2020 1055   PCO2ART 47.1 05/19/2020 1055   PO2ART 155 (H) 05/19/2020 1055   HCO3 33.0 (H) 06/03/2020 0052   TCO2 35 (H) 06/03/2020 0052   O2SAT 99.0 06/03/2020 0052     Coagulation Profile: No results for input(s): INR, PROTIME in the last 168 hours.  Cardiac Enzymes: No results for input(s): CKTOTAL, CKMB, CKMBINDEX, TROPONINI in the last 168 hours.  HbA1C: Hgb A1c MFr Bld  Date/Time Value Ref Range Status  02/26/2020 05:18 PM 5.4 4.8 - 5.6 % Final    Comment:    (NOTE) Pre diabetes:          5.7%-6.4%  Diabetes:              >6.4%  Glycemic control for   <7.0% adults with diabetes    12/22/2018 04:37 AM 5.5 4.8 - 5.6 % Final    Comment:    (NOTE) Pre diabetes:          5.7%-6.4% Diabetes:              >6.4% Glycemic control for   <7.0% adults with diabetes     CBG: Recent Labs  Lab 06/07/20 1145 06/07/20 1528 06/07/20 1911 06/07/20 2324 06/08/20 0308  GLUCAP 175* 191* 171* 124* 173*   Chari Manning, D.O.  Internal Medicine Resident, PGY-2 [facility]  Pager: [phone] 6:04 AM, 06/08/2020

## 2020-06-09 DIAGNOSIS — J9622 Acute and chronic respiratory failure with hypercapnia: Secondary | ICD-10-CM | POA: Diagnosis not present

## 2020-06-09 DIAGNOSIS — J9621 Acute and chronic respiratory failure with hypoxia: Secondary | ICD-10-CM | POA: Diagnosis not present

## 2020-06-09 LAB — GLUCOSE, CAPILLARY
Glucose-Capillary: 117 mg/dL — ABNORMAL HIGH (ref 70–99)
Glucose-Capillary: 122 mg/dL — ABNORMAL HIGH (ref 70–99)
Glucose-Capillary: 174 mg/dL — ABNORMAL HIGH (ref 70–99)
Glucose-Capillary: 182 mg/dL — ABNORMAL HIGH (ref 70–99)
Glucose-Capillary: 204 mg/dL — ABNORMAL HIGH (ref 70–99)
Glucose-Capillary: 223 mg/dL — ABNORMAL HIGH (ref 70–99)

## 2020-06-09 MED ORDER — HYDROCHLOROTHIAZIDE 25 MG PO TABS
12.5000 mg | ORAL_TABLET | Freq: Every day | ORAL | Status: DC
  Administered 2020-06-09 – 2020-06-10 (×2): 12.5 mg
  Filled 2020-06-09 (×4): qty 0.5

## 2020-06-09 NOTE — Progress Notes (Addendum)
eLink Physician-Brief Progress Note Patient Name: Kathleen Cameron DOB: Sep 26, 1965 MRN: [id number]   Date of Service  06/09/2020  HPI/Events of Note  EKG sinus tachycardia, saturation 98 %. Bedside RN feels patient is anxious.  eICU Interventions  Klonopin dose scheduled for 10 a.m. ordered to be given earlier at 6 a.m. once.        [name] 06/09/2020, 5:42 AM

## 2020-06-09 NOTE — Progress Notes (Signed)
RT assisted Dr. [name] and pt husband with placing pt on home ventilator. Per CCM MD okay to leave on home settings 480/16/+5/5Loxygen bled in. Verbal order received to place pt back on hospital vent when MD leaves for evening. Will attempt to place pt back on home vent tomorrow. RT will continue to monitor and be available as needed.

## 2020-06-09 NOTE — Progress Notes (Signed)
NAME:  Kathleen Cameron, MRN:  [id number], DOB:  [birth year], LOS: 6 ADMISSION DATE:  06/03/2020, CONSULTATION DATE:  06/02/2020 REFERRING MD:  Dr. Pilar Plate, CHIEF COMPLAINT: Acute respiratory distress  Brief History:  55 year old female presented from home via EMS with complaints of acute respiratory distress.  [facility]  PCCM consulted for further management and admission  History of Present Illness:  Kathleen Cameron is a 55 y.o. with a past medical history significant for seizure disorder, hypertension, severe COPD, acute on chronic hypoxic and hypercapnic respiratory failure, and anxiety who presented to the ED via EMS with acute on chronic hypoxic and hypercapnic respiratory failure.  Of note patient was recently discharged from [facility] 2 days prior to admission.  Primary history obtained from chart review as patient is currently on ventilator through chronic trach.  It appears patient was treated at this facility November 2021 for acute on chronic hypoxic respiratory failure she required endotracheal intubation and eventual tracheostomy due to difficulty weaning 03/02/2020.  On [facility]  Upon discharge home per chart review it appears patient's home ventilator malfunctioned, which resulted in respiratory failure and need for bag mask ventilation.  On arrival to ED patient was placed on ventilator with home vent settings with no further acute respiratory compromise.  PCCM was called for further management and admission.   Past Medical History:  Seizure disorder Hypertension Severe COPD Acute on chronic hypoxic and hypercapnic respiratory failure Anxiety  Significant Hospital Events:  Admitted 2/13  Consults:  TOC, PT/OT  Procedures:  n/a  Significant Diagnostic Tests:  Chest x-ray 2/13 > worsening left perihilar airspace disease with increased patchy opacities and bronchial thickening suspicious for  worsening pneumonia  Micro Data:  Covid 2/13 > negative  2/13 BCID - staph species  Blood culture 2/13 > GPC in clusters (Staph Hominis) (2x), NG 1d (2x) Sputum culture 2/13 > normal flora MRSA PCR 2/13 > negative  BCx 2/16 - NG at 24 hours  Antimicrobials:  Cefepime 2/13 > 2/19 Vancomycin 2/13 - 2/13  Interim History / Subjective:   Did not tolerate home ventilator when tried on 2/18 Agitation noted this morning, clonazepam given early I/O+ 1 L total 0.50, PEEP 5  Objective   Blood pressure 118/78, pulse (!) 101, temperature 99.4 F (37.4 C), temperature source Axillary, resp. rate 16, height 5\' 4"  (1.626 m), weight 53.2 kg, SpO2 100 %.    Vent Mode: PRVC FiO2 (%):  [40 %-50 %] 50 % Set Rate:  [16 bmp] 16 bmp Vt Set:  [420 mL] 420 mL PEEP:  [5 cmH20] 5 cmH20 Plateau Pressure:  [18 cmH20-24 cmH20] 24 cmH20   Intake/Output Summary (Last 24 hours) at 06/09/2020 0746 Last data filed at 06/09/2020 0600 Gross per 24 hour  Intake 2507.93 ml  Output 1200 ml  Net 1307.93 ml   Filed Weights   06/07/20 0500 06/08/20 0314 06/09/20 0428  Weight: 55.1 kg 52.7 kg 53.2 kg    Examination: General: Chronically ill, ventilated, no agitation currently HEENT: Tracheostomy tube in place, clean and dry, oropharynx dry, pupils equal Neck: Trach site CDI Neuro: Wakes to voice, will track, will follow commands but a bit slow to respond CV: Regular, distant, no murmur PULM: Bilateral inspiratory crackles, no wheezing GI: Nondistended, positive bowel sounds Extremities: No cyanosis   Resolved Hospital Problem list   Severe sepsis  Assessment & Plan:   Acute on chronic hypoxic and hypercapnic respiratory failure due to COPD, vent  dependetn. Chronic MV with vent malfunctioned (totally turned off- confirmed by home respiratory company).  Progressive VAP/HAP, Staph Hominis (presumed contaminant) [facility] with [facility] of [facility]. Patient's MV  malfunctioned at home/shut off. Pneumonia progressed causing hypoxic/hypercarbic respiratory failure.  -Continue cefepime, plan for 7 days total for presumed HCAP, finishes today 2/19 -Prednisone 20 mg, 5 days total -Continue current bronchodilator regimen, Brovana, Yupelri, budesonide -Minimize sedation as able -Follow culture data -Planning to try to transition back to home ventilator to facilitate discharge as she improves from acute events  HTN: -Continue amlodipine, HCTZ, labetalol as needed -Continuous telemetry  Hypothyroidism -On Synthroid  At risk malnutrition Generalized weakness: -Continue tube feeding through PEG -PT/OT  Encephalopathy, anxiety -Restoril, Effexor-scheduled clonazepam  Best practice (evaluated daily)  Diet: Tube feeds resumed through PEG Pain/Anxiety/Delirium protocol (if indicated): In place VAP protocol (if indicated): In place DVT prophylaxis: Subcu heparin GI prophylaxis: PPI Glucose control: Monitor Mobility: Bedrest Disposition: ICU  Goals of Care:  Last date of multidisciplinary goals of care discussion: Pending Family and staff present: Pending Summary of discussion: Pending Follow up goals of care discussion due: Pending Code Status: Full Family: called husband Earvin Hansen 2/19   Labs   CBC: Recent Labs  Lab 06/03/20 0340 06/05/20 0800 06/06/20 0707 06/07/20 0915 06/08/20 0615  WBC 19.1* 14.9* 13.2* 15.6* 11.0*  HGB 10.0* 10.5* 10.5* 10.6* 9.5*  HCT 32.5* 35.9* 33.9* 34.4* 31.2*  MCV 94.2 95.5 92.6 93.0 93.4  PLT 284 255 252 281 211    Basic Metabolic Panel: Recent Labs  Lab 06/03/20 0036 06/03/20 0052 06/03/20 0340 06/03/20 1751 06/04/20 0323 06/05/20 0800 06/06/20 0707 06/07/20 0915 06/07/20 1330 06/08/20 0615  NA 139 139  --   --   --  142 145 141  --  140  K 4.4 4.4  --   --   --  3.3* 4.3 4.0  --  4.2  CL 98  --   --   --   --  99 100 96*  --  95*  CO2 28  --   --   --   --  32 35* 37*  --  36*  GLUCOSE  158*  --   --   --   --  165* 156* 217*  --  142*  BUN 41*  --   --   --   --  25* 20 21*  --  23*  CREATININE 0.60  --  0.50  --   --  0.31* 0.31* 0.38*  --  0.30*  CALCIUM 9.1  --   --   --   --  8.9 9.0 8.8*  --  8.6*  MG  --   --  2.4 2.1 2.0  --   --   --  1.9  --   PHOS  --   --  4.9* 4.2 3.4  --   --   --  2.0*  --    GFR: Estimated Creatinine Clearance: 65.9 mL/min (A) (by C-G formula based on SCr of 0.3 mg/dL (L)). Recent Labs  Lab 06/03/20 0035 06/03/20 0036 06/03/20 0340 06/04/20 0323 06/05/20 0151 06/05/20 0800 06/06/20 0707 06/07/20 0915 06/08/20 0615  PROCALCITON  --   --  0.45 0.98 0.65  --  0.41  --   --   WBC  --    < > 19.1*  --   --  14.9* 13.2* 15.6* 11.0*  LATICACIDVEN 2.3*  --   --   --   --   --   --   --   --    < > =  values in this interval not displayed.    Liver Function Tests: Recent Labs  Lab 06/03/20 0036 06/05/20 0800 06/06/20 0707 06/08/20 0615  AST 149* 20 30 35  ALT 88* 49* 42 48*  ALKPHOS 163* 133* 130* 98  BILITOT 0.7 0.4 0.7 0.6  PROT 6.1* 5.8* 6.1* 5.3*  ALBUMIN 2.7* 2.4* 2.2* 2.2*   No results for input(s): LIPASE, AMYLASE in the last 168 hours. No results for input(s): AMMONIA in the last 168 hours.  ABG    Component Value Date/Time   PHART 7.495 (H) 05/19/2020 1055   PCO2ART 47.1 05/19/2020 1055   PO2ART 155 (H) 05/19/2020 1055   HCO3 33.0 (H) 06/03/2020 0052   TCO2 35 (H) 06/03/2020 0052   O2SAT 99.0 06/03/2020 0052     Coagulation Profile: No results for input(s): INR, PROTIME in the last 168 hours.  Cardiac Enzymes: No results for input(s): CKTOTAL, CKMB, CKMBINDEX, TROPONINI in the last 168 hours.  HbA1C: Hgb A1c MFr Bld  Date/Time Value Ref Range Status  02/26/2020 05:18 PM 5.4 4.8 - 5.6 % Final    Comment:    (NOTE) Pre diabetes:          5.7%-6.4%  Diabetes:              >6.4%  Glycemic control for   <7.0% adults with diabetes   12/22/2018 04:37 AM 5.5 4.8 - 5.6 % Final    Comment:     (NOTE) Pre diabetes:          5.7%-6.4% Diabetes:              >6.4% Glycemic control for   <7.0% adults with diabetes     CBG: Recent Labs  Lab 06/08/20 1840 06/08/20 2016 06/08/20 2310 06/09/20 0426 06/09/20 0724  GLUCAP 140* 73 122* 122* 117*    Independent critical care time 32 minutes   Levy Pupa, MD, PhD 06/09/2020, 7:52 AM [facility] [phone] or if no answer before 7:00PM call [phone] For any issues after 7:00PM please call eLink [phone]

## 2020-06-09 NOTE — Progress Notes (Signed)
Pt placed back on Hopsital vent per MD request. PRVC 480/16/40%/+5. Pt tolerating well at thie time.

## 2020-06-10 ENCOUNTER — Inpatient Hospital Stay (HOSPITAL_COMMUNITY): Payer: No Typology Code available for payment source

## 2020-06-10 DIAGNOSIS — J9621 Acute and chronic respiratory failure with hypoxia: Secondary | ICD-10-CM | POA: Diagnosis not present

## 2020-06-10 DIAGNOSIS — J9622 Acute and chronic respiratory failure with hypercapnia: Secondary | ICD-10-CM | POA: Diagnosis not present

## 2020-06-10 LAB — BASIC METABOLIC PANEL
Anion gap: 8 (ref 5–15)
BUN: 23 mg/dL — ABNORMAL HIGH (ref 6–20)
CO2: 39 mmol/L — ABNORMAL HIGH (ref 22–32)
Calcium: 9.1 mg/dL (ref 8.9–10.3)
Chloride: 94 mmol/L — ABNORMAL LOW (ref 98–111)
Creatinine, Ser: 0.36 mg/dL — ABNORMAL LOW (ref 0.44–1.00)
GFR, Estimated: >60 mL/min (ref >60)
Glucose, Bld: 161 mg/dL — ABNORMAL HIGH (ref 70–99)
Potassium: 4 mmol/L (ref 3.5–5.1)
Sodium: 141 mmol/L (ref 135–145)

## 2020-06-10 LAB — CBC
HCT: 29.9 % — ABNORMAL LOW (ref 36.0–46.0)
Hemoglobin: 8.8 g/dL — ABNORMAL LOW (ref 12.0–15.0)
MCH: 27.8 pg (ref 26.0–34.0)
MCHC: 29.4 g/dL — ABNORMAL LOW (ref 30.0–36.0)
MCV: 94.3 fL (ref 80.0–100.0)
Platelets: 198 10*3/uL (ref 150–400)
RBC: 3.17 MIL/uL — ABNORMAL LOW (ref 3.87–5.11)
RDW: 17.8 % — ABNORMAL HIGH (ref 11.5–15.5)
WBC: 12.3 10*3/uL — ABNORMAL HIGH (ref 4.0–10.5)
nRBC: 0 % (ref 0.0–0.2)

## 2020-06-10 LAB — GLUCOSE, CAPILLARY
Glucose-Capillary: 106 mg/dL — ABNORMAL HIGH (ref 70–99)
Glucose-Capillary: 128 mg/dL — ABNORMAL HIGH (ref 70–99)
Glucose-Capillary: 133 mg/dL — ABNORMAL HIGH (ref 70–99)
Glucose-Capillary: 136 mg/dL — ABNORMAL HIGH (ref 70–99)
Glucose-Capillary: 91 mg/dL (ref 70–99)
Glucose-Capillary: 93 mg/dL (ref 70–99)

## 2020-06-10 LAB — MAGNESIUM: Magnesium: 2.2 mg/dL (ref 1.7–2.4)

## 2020-06-10 MED ORDER — ALBUTEROL SULFATE (2.5 MG/3ML) 0.083% IN NEBU
2.5000 mg | INHALATION_SOLUTION | Freq: Four times a day (QID) | RESPIRATORY_TRACT | Status: DC | PRN
  Administered 2020-06-27: 2.5 mg via RESPIRATORY_TRACT
  Filled 2020-06-10: qty 3

## 2020-06-10 NOTE — Progress Notes (Addendum)
NAME:  Kathleen Cameron, MRN:  [id number], DOB:  27-Nov-1965, LOS: 7 ADMISSION DATE:  06/03/2020, CONSULTATION DATE:  06/02/2020 REFERRING MD:  Dr. Pilar Plate, CHIEF COMPLAINT: Acute respiratory distress  Brief History:  55 year old female presented from home via EMS with complaints of acute respiratory distress.  [facility]  PCCM consulted for further management and admission  History of Present Illness:  Kathleen Cameron is a 55 y.o. with a past medical history significant for seizure disorder, hypertension, severe COPD, acute on chronic hypoxic and hypercapnic respiratory failure, and anxiety who presented to the ED via EMS with acute on chronic hypoxic and hypercapnic respiratory failure.  Of note patient was recently discharged from [facility] 2 days prior to admission.  Primary history obtained from chart review as patient is currently on ventilator through chronic trach.  It appears patient was treated at this facility November 2021 for acute on chronic hypoxic respiratory failure she required endotracheal intubation and eventual tracheostomy due to difficulty weaning 03/02/2020.  On [facility]  Upon discharge home per chart review it appears patient's home ventilator malfunctioned, which resulted in respiratory failure and need for bag mask ventilation.  On arrival to ED patient was placed on ventilator with home vent settings with no further acute respiratory compromise.  PCCM was called for further management and admission.   Past Medical History:  Seizure disorder Hypertension Severe COPD Acute on chronic hypoxic and hypercapnic respiratory failure Anxiety  Significant Hospital Events:  Admitted 2/13  Consults:  TOC, PT/OT  Procedures:  n/a  Significant Diagnostic Tests:  Chest x-ray 2/13 > worsening left perihilar airspace disease with increased patchy opacities and bronchial thickening suspicious for  worsening pneumonia  Micro Data:  Covid 2/13 > negative  2/13 BCID - staph species  Blood culture 2/13 > GPC in clusters (Staph Hominis) (2x), NG 1d (2x) Sputum culture 2/13 > normal flora MRSA PCR 2/13 > negative  BCx 2/16 - NG at 24 hours  Antimicrobials:  Cefepime 2/13 > 2/19 Vancomycin 2/13 - 2/13  Interim History / Subjective:  Tolerated home ventilator yesterday afternoon, planning to retry today. I/O+ 2 L total    Objective   Blood pressure (!) 145/89, pulse (!) 101, temperature 99.3 F (37.4 C), temperature source Axillary, resp. rate 18, height 5\' 4"  (1.626 m), weight 53.2 kg, SpO2 100 %.    Vent Mode: PRVC FiO2 (%):  [40 %] 40 % Set Rate:  [16 bmp] 16 bmp Vt Set:  [420 mL-480 mL] 480 mL PEEP:  [5 cmH20] 5 cmH20 Plateau Pressure:  [20 cmH20] 20 cmH20   Intake/Output Summary (Last 24 hours) at 06/10/2020 0716 Last data filed at 06/10/2020 0600 Gross per 24 hour  Intake 2214.4 ml  Output 1300 ml  Net 914.4 ml   Filed Weights   06/08/20 0314 06/09/20 0428 06/10/20 0356  Weight: 52.7 kg 53.2 kg 53.2 kg    Examination: General: Chronically ill, ventilated, comfortable HEENT: Tracheostomy in place, clean and dry, oropharynx dry Neck: Trach site CDI, some blood-tinged secretions Neuro: Wakes to voice, tracks, follows commands a bit slow to respond CV: Regular, borderline tachycardic, no murmur PULM: Scattered bilateral expiratory rhonchi without wheezing GI: Nondistended, positive bowel sounds, PEG in place Extremities: No cyanosis, trace edema   Resolved Hospital Problem list   Severe sepsis  Assessment & Plan:   Acute on chronic hypoxic and hypercapnic respiratory failure due to COPD, vent dependetn. Chronic MV with vent malfunctioned (totally  turned off- confirmed by home respiratory company).  Progressive VAP/HAP, Staph Hominis (presumed contaminant) [facility] with [facility] of [facility]. Patient's MV malfunctioned at  home/shut off. Pneumonia progressed causing hypoxic/hypercarbic respiratory failure.  Now treated -Prednisone 5 days completed on 2/20 -Cefepime 7 days for presumed HCAP completed 2/19 -COPD/bronchodilators: Roxy Manns, budesonide -Minimize sedation as able -Was chronically vent dependent at time of her transfer from [facility], presumed dispo plan will be for her to go home with ventilation 24x7.  Working on acclimatization to her home vent in ICU.  Her husband has been educated about the device  HTN: -On amlodipine, HCTZ -Labetalol as needed -Telemetry monitoring  Hypothyroidism -Continue Synthroid  At risk malnutrition Generalized weakness: -Bolus tube feeding via PEG -PT/OT  Encephalopathy, anxiety -Continue Restoril, Effexor-scheduled clonazepam  Best practice (evaluated daily)  Diet: Tube feeds resumed through PEG Pain/Anxiety/Delirium protocol (if indicated): In place VAP protocol (if indicated): In place DVT prophylaxis: Subcu heparin GI prophylaxis: PPI Glucose control: Monitor Mobility: Bedrest Disposition: ICU  Goals of Care:  Last date of multidisciplinary goals of care discussion: Pending Family and staff present: Pending Summary of discussion: Pending Follow up goals of care discussion due: Pending Code Status: Full Family: called husband Earvin Hansen 2/19   Labs   CBC: Recent Labs  Lab 06/05/20 0800 06/06/20 0707 06/07/20 0915 06/08/20 0615 06/10/20 0211  WBC 14.9* 13.2* 15.6* 11.0* 12.3*  HGB 10.5* 10.5* 10.6* 9.5* 8.8*  HCT 35.9* 33.9* 34.4* 31.2* 29.9*  MCV 95.5 92.6 93.0 93.4 94.3  PLT 255 252 281 211 198    Basic Metabolic Panel: Recent Labs  Lab 06/03/20 1751 06/04/20 0323 06/05/20 0800 06/06/20 0707 06/07/20 0915 06/07/20 1330 06/08/20 0615 06/10/20 0211  NA  --   --  142 145 141  --  140 141  K  --   --  3.3* 4.3 4.0  --  4.2 4.0  CL  --   --  99 100 96*  --  95* 94*  CO2  --   --  32 35* 37*  --  36* 39*  GLUCOSE  --   --  165*  156* 217*  --  142* 161*  BUN  --   --  25* 20 21*  --  23* 23*  CREATININE  --   --  0.31* 0.31* 0.38*  --  0.30* 0.36*  CALCIUM  --   --  8.9 9.0 8.8*  --  8.6* 9.1  MG 2.1 2.0  --   --   --  1.9  --  2.2  PHOS 4.2 3.4  --   --   --  2.0*  --   --    GFR: Estimated Creatinine Clearance: 65.9 mL/min (A) (by C-G formula based on SCr of 0.36 mg/dL (L)). Recent Labs  Lab 06/04/20 0323 06/05/20 0151 06/05/20 0800 06/06/20 0707 06/07/20 0915 06/08/20 0615 06/10/20 0211  PROCALCITON 0.98 0.65  --  0.41  --   --   --   WBC  --   --    < > 13.2* 15.6* 11.0* 12.3*   < > = values in this interval not displayed.    Liver Function Tests: Recent Labs  Lab 06/05/20 0800 06/06/20 0707 06/08/20 0615  AST 20 30 35  ALT 49* 42 48*  ALKPHOS 133* 130* 98  BILITOT 0.4 0.7 0.6  PROT 5.8* 6.1* 5.3*  ALBUMIN 2.4* 2.2* 2.2*   No results for input(s): LIPASE, AMYLASE in the last  168 hours. No results for input(s): AMMONIA in the last 168 hours.  ABG    Component Value Date/Time   PHART 7.495 (H) 05/19/2020 1055   PCO2ART 47.1 05/19/2020 1055   PO2ART 155 (H) 05/19/2020 1055   HCO3 33.0 (H) 06/03/2020 0052   TCO2 35 (H) 06/03/2020 0052   O2SAT 99.0 06/03/2020 0052     Coagulation Profile: No results for input(s): INR, PROTIME in the last 168 hours.  Cardiac Enzymes: No results for input(s): CKTOTAL, CKMB, CKMBINDEX, TROPONINI in the last 168 hours.  HbA1C: Hgb A1c MFr Bld  Date/Time Value Ref Range Status  02/26/2020 05:18 PM 5.4 4.8 - 5.6 % Final    Comment:    (NOTE) Pre diabetes:          5.7%-6.4%  Diabetes:              >6.4%  Glycemic control for   <7.0% adults with diabetes   12/22/2018 04:37 AM 5.5 4.8 - 5.6 % Final    Comment:    (NOTE) Pre diabetes:          5.7%-6.4% Diabetes:              >6.4% Glycemic control for   <7.0% adults with diabetes     CBG: Recent Labs  Lab 06/09/20 1115 06/09/20 1545 06/09/20 2003 06/09/20 2342 06/10/20 0358   GLUCAP 174* 204* 182* 223* 136*    Independent critical care time 31 minutes   Levy Pupa, MD, PhD 06/10/2020, 7:16 AM [facility] [phone] or if no answer before 7:00PM call [phone] For any issues after 7:00PM please call eLink [phone]

## 2020-06-10 NOTE — Progress Notes (Signed)
Pt placed on home vent this morning 06/10/20 at 0900. Per Dr. [name] verbal order please leave pt on home vent as tolerated. RN notified. RT will continue to monitor and be available as needed.

## 2020-06-11 DIAGNOSIS — J9622 Acute and chronic respiratory failure with hypercapnia: Secondary | ICD-10-CM | POA: Diagnosis not present

## 2020-06-11 DIAGNOSIS — J9621 Acute and chronic respiratory failure with hypoxia: Secondary | ICD-10-CM | POA: Diagnosis not present

## 2020-06-11 LAB — GLUCOSE, CAPILLARY
Glucose-Capillary: 115 mg/dL — ABNORMAL HIGH (ref 70–99)
Glucose-Capillary: 137 mg/dL — ABNORMAL HIGH (ref 70–99)
Glucose-Capillary: 144 mg/dL — ABNORMAL HIGH (ref 70–99)
Glucose-Capillary: 153 mg/dL — ABNORMAL HIGH (ref 70–99)
Glucose-Capillary: 83 mg/dL (ref 70–99)
Glucose-Capillary: 88 mg/dL (ref 70–99)

## 2020-06-11 LAB — CBC WITH DIFFERENTIAL/PLATELET
Abs Immature Granulocytes: 0.16 10*3/uL — ABNORMAL HIGH (ref 0.00–0.07)
Basophils Absolute: 0 10*3/uL (ref 0.0–0.1)
Basophils Relative: 0 %
Eosinophils Absolute: 0.1 10*3/uL (ref 0.0–0.5)
Eosinophils Relative: 1 %
HCT: 27 % — ABNORMAL LOW (ref 36.0–46.0)
Hemoglobin: 8 g/dL — ABNORMAL LOW (ref 12.0–15.0)
Immature Granulocytes: 1 %
Lymphocytes Relative: 10 %
Lymphs Abs: 1.2 10*3/uL (ref 0.7–4.0)
MCH: 27.6 pg (ref 26.0–34.0)
MCHC: 29.6 g/dL — ABNORMAL LOW (ref 30.0–36.0)
MCV: 93.1 fL (ref 80.0–100.0)
Monocytes Absolute: 0.5 10*3/uL (ref 0.1–1.0)
Monocytes Relative: 4 %
Neutro Abs: 9.7 10*3/uL — ABNORMAL HIGH (ref 1.7–7.7)
Neutrophils Relative %: 84 %
Platelets: 170 10*3/uL (ref 150–400)
RBC: 2.9 MIL/uL — ABNORMAL LOW (ref 3.87–5.11)
RDW: 18 % — ABNORMAL HIGH (ref 11.5–15.5)
WBC: 11.6 10*3/uL — ABNORMAL HIGH (ref 4.0–10.5)
nRBC: 0.2 % (ref 0.0–0.2)

## 2020-06-11 LAB — CULTURE, BLOOD (ROUTINE X 2)
Culture: NO GROWTH
Culture: NO GROWTH
Special Requests: ADEQUATE

## 2020-06-11 LAB — BASIC METABOLIC PANEL
Anion gap: 11 (ref 5–15)
BUN: 23 mg/dL — ABNORMAL HIGH (ref 6–20)
CO2: 35 mmol/L — ABNORMAL HIGH (ref 22–32)
Calcium: 9.3 mg/dL (ref 8.9–10.3)
Chloride: 95 mmol/L — ABNORMAL LOW (ref 98–111)
Creatinine, Ser: 0.34 mg/dL — ABNORMAL LOW (ref 0.44–1.00)
GFR, Estimated: >60 mL/min (ref >60)
Glucose, Bld: 89 mg/dL (ref 70–99)
Potassium: 3.8 mmol/L (ref 3.5–5.1)
Sodium: 141 mmol/L (ref 135–145)

## 2020-06-11 LAB — MAGNESIUM: Magnesium: 2 mg/dL (ref 1.7–2.4)

## 2020-06-11 MED ORDER — ONDANSETRON 4 MG PO TBDP
4.0000 mg | ORAL_TABLET | Freq: Four times a day (QID) | ORAL | Status: DC | PRN
  Administered 2020-06-11 – 2020-07-01 (×3): 4 mg via ORAL
  Filled 2020-06-11 (×5): qty 1

## 2020-06-11 NOTE — TOC Progression Note (Addendum)
Transition of Care [facility]) - Progression Note    Patient Details  Name: Kathleen Cameron MRN: [id number] Date of Birth: Aug 29, 1965  [facility] CM/SW Contact  Huston Foley Jacklynn Ganong, RN Phone Number: 06/11/2020, 3:39 PM  Clinical Narrative:   Case Manager has left message for Janey Greaser RN with [name] [phone], to follow up on status of getting authorization for patient's services. (See CM note from 2/16). 4:48pm CM received call from Janey Greaser, RN. Alol information, letter of Medical Necessity have been sent to insurance, they are now waiting for authorization for 56 hrs of Nursing. CM did notify Rob that patient's husband has been trained at bedside. TOC Team will continue to monitor.    Expected Discharge Plan: Home w Home Health Services Barriers to Discharge: Continued Medical Work up  Expected Discharge Plan and Services Expected Discharge Plan: Home w Home Health Services   Discharge Planning Services: CM Consult Post Acute Care Choice: Home Health,Durable Medical Equipment Living arrangements for the past 2 months: Apartment                                       Social Determinants of Health (SDOH) Interventions    Readmission Risk Interventions Readmission Risk Prevention Plan 02/02/2020  Transportation Screening Complete  PCP or Specialist Appt within 3-5 Days Complete  HRI or Home Care Consult Complete  Social Work Consult for Recovery Care Planning/Counseling Complete  Palliative Care Screening Not Applicable  Medication Review Oceanographer) Complete  Some recent data might be hidden

## 2020-06-11 NOTE — Progress Notes (Signed)
NAME:  Kathleen Cameron, MRN:  [id number], DOB:  [birth year], LOS: 8 ADMISSION DATE:  06/03/2020, CONSULTATION DATE:  06/02/2020 REFERRING MD:  Dr. Pilar Plate, CHIEF COMPLAINT: Acute respiratory distress  Brief History:  55 year old female presented from home via EMS with complaints of acute respiratory distress.  [facility]  PCCM consulted for further management and admission.  History of Present Illness:  Kathleen Cameron is a 54 y.o. with a past medical history significant for seizure disorder, hypertension, severe COPD, acute on chronic hypoxic and hypercapnic respiratory failure, and anxiety who presented to the ED via EMS with acute on chronic hypoxic and hypercapnic respiratory failure.  Of note patient was recently discharged from [facility] 2 days prior to admission.  Primary history obtained from chart review as patient is currently on ventilator through chronic trach.  It appears patient was treated at this facility November 2021 for acute on chronic hypoxic respiratory failure she required endotracheal intubation and eventual tracheostomy due to difficulty weaning 03/02/2020.  On 11/18 [facility]  Upon discharge home per chart review it appears patient's home ventilator malfunctioned, which resulted in respiratory failure and need for bag mask ventilation.  On arrival to ED patient was placed on ventilator with home vent settings with no further acute respiratory compromise.  PCCM was called for further management and admission.   Past Medical History:  Seizure disorder Hypertension Severe COPD Acute on chronic hypoxic and hypercapnic respiratory failure Anxiety  Significant Hospital Events:  Admitted 2/13  Consults:  TOC, PT/OT  Procedures:  n/a  Significant Diagnostic Tests:  Chest x-ray 2/13 > worsening left perihilar airspace disease with increased patchy opacities and bronchial thickening suspicious for  worsening pneumonia  Micro Data:  Covid 2/13 > negative  2/13 BCID - staph species  Blood culture 2/13 > GPC in clusters (Staph Hominis) (2x), NG 1d (2x) Sputum culture 2/13 > normal flora MRSA PCR 2/13 > negative  BCx 2/16 - NG at 5 days  Antimicrobials:  Cefepime 2/13 > 2/19 Vancomycin 2/13 - 2/13  Interim History / Subjective:   Patient remains on home ventilator and appears conformable. She has some lower extremity edema, but otheriwse, she denies any new complaints.    Objective   Blood pressure 99/78, pulse (!) 105, temperature 98.4 F (36.9 C), temperature source Oral, resp. rate 16, height 5\' 4"  (1.626 m), weight 52.5 kg, SpO2 100 %.    Vent Mode: Other (Comment) FiO2 (%):  [40 %] 40 % Set Rate:  [16 bmp] 16 bmp Vt Set:  [420 mL-480 mL] 480 mL PEEP:  [5 cmH20] 5 cmH20   Intake/Output Summary (Last 24 hours) at 06/11/2020 0556 Last data filed at 06/11/2020 0500 Gross per 24 hour  Intake 1857.96 ml  Output 2045 ml  Net -187.04 ml   Filed Weights   06/09/20 0428 06/10/20 0356 06/11/20 0358  Weight: 53.2 kg 53.2 kg 52.5 kg    Examination: General: Chronically ill, ventilated, comfortable HEENT: Tracheostomy in place, clean and dry, oropharynx dry Neck: Trach site CDI, some blood-tinged secretions Neuro: Wakes to voice, tracks, and follows commands, slow to respond/sleepy CV: Regular, borderline tachycardic, no murmur PULM: Scattered bilateral expiratory rhonchi that have improved without wheezing GI: Nondistended, positive bowel sounds, PEG in place Extremities: No cyanosis, with 1-2+ edema in lower extremities.    Resolved Hospital Problem list   Severe sepsis  Assessment & Plan:   Acute on chronic hypoxic and hypercapnic respiratory failure due to  COPD, vent dependetn. Chronic MV with vent malfunctioned (totally turned off- confirmed by home respiratory company).  Progressive VAP/HAP, Staph Hominis (presumed contaminant) [facility] discharged [facility] with evidence of pneumonia. Patient's MV malfunctioned at home/shut off. Pneumonia progressed causing hypoxic/hypercarbic respiratory failure.  Now treated -Prednisone 5 days completed on 2/20 -Cefepime 7 days for presumed HCAP completed 2/19 -COPD/bronchodilators: Roxy Manns, budesonide -Minimize sedation as able -Was chronically vent dependent at time of her transfer from [facility], presumed dispo plan will be for her to go home with ventilation 24x7.  Working on acclimatization to her home vent in ICU.  Her husband has been educated about the device.  - will aim for DC in the next 1-2 days. - PT/OT  HTN: -On amlodipine, HCTZ -Labetalol as needed -Telemetry monitoring  Hypothyroidism -Continue Synthroid  At risk malnutrition Generalized weakness: -Bolus tube feeding via PEG -PT/OT  Encephalopathy, anxiety -Continue Restoril, Effexor-scheduled clonazepam  Best practice (evaluated daily)  Diet: Tube feeds resumed through PEG Pain/Anxiety/Delirium protocol (if indicated): In place VAP protocol (if indicated): In place DVT prophylaxis: Subcu heparin GI prophylaxis: PPI Glucose control: Monitor Mobility: Bedrest Disposition: ICU  Goals of Care:  Last date of multidisciplinary goals of care discussion: Pending Family and staff present: Pending Summary of discussion: Pending Follow up goals of care discussion due: Pending Code Status: Full Family: called husband Earvin Hansen 2/19   Labs   CBC: Recent Labs  Lab 06/05/20 0800 06/06/20 0707 06/07/20 0915 06/08/20 0615 06/10/20 0211  WBC 14.9* 13.2* 15.6* 11.0* 12.3*  HGB 10.5* 10.5* 10.6* 9.5* 8.8*  HCT 35.9* 33.9* 34.4* 31.2* 29.9*  MCV 95.5 92.6 93.0 93.4 94.3  PLT 255 252 281 211 198    Basic Metabolic Panel: Recent Labs  Lab 06/05/20 0800 06/06/20 0707 06/07/20 0915 06/07/20 1330 06/08/20 0615 06/10/20 0211  NA 142 145 141  --  140 141  K 3.3* 4.3 4.0  --  4.2 4.0  CL 99 100 96*  --  95* 94*  CO2  32 35* 37*  --  36* 39*  GLUCOSE 165* 156* 217*  --  142* 161*  BUN 25* 20 21*  --  23* 23*  CREATININE 0.31* 0.31* 0.38*  --  0.30* 0.36*  CALCIUM 8.9 9.0 8.8*  --  8.6* 9.1  MG  --   --   --  1.9  --  2.2  PHOS  --   --   --  2.0*  --   --    GFR: Estimated Creatinine Clearance: 65.1 mL/min (A) (by C-G formula based on SCr of 0.36 mg/dL (L)). Recent Labs  Lab 06/05/20 0151 06/05/20 0800 06/06/20 0707 06/07/20 0915 06/08/20 0615 06/10/20 0211  PROCALCITON 0.65  --  0.41  --   --   --   WBC  --    < > 13.2* 15.6* 11.0* 12.3*   < > = values in this interval not displayed.    Liver Function Tests: Recent Labs  Lab 06/05/20 0800 06/06/20 0707 06/08/20 0615  AST 20 30 35  ALT 49* 42 48*  ALKPHOS 133* 130* 98  BILITOT 0.4 0.7 0.6  PROT 5.8* 6.1* 5.3*  ALBUMIN 2.4* 2.2* 2.2*   No results for input(s): LIPASE, AMYLASE in the last 168 hours. No results for input(s): AMMONIA in the last 168 hours.  ABG    Component Value Date/Time   PHART 7.495 (H) 05/19/2020 1055   PCO2ART 47.1 05/19/2020 1055   PO2ART 155 (H) 05/19/2020 1055  HCO3 33.0 (H) 06/03/2020 0052   TCO2 35 (H) 06/03/2020 0052   O2SAT 99.0 06/03/2020 0052     Coagulation Profile: No results for input(s): INR, PROTIME in the last 168 hours.  Cardiac Enzymes: No results for input(s): CKTOTAL, CKMB, CKMBINDEX, TROPONINI in the last 168 hours.  HbA1C: Hgb A1c MFr Bld  Date/Time Value Ref Range Status  02/26/2020 05:18 PM 5.4 4.8 - 5.6 % Final    Comment:    (NOTE) Pre diabetes:          5.7%-6.4%  Diabetes:              >6.4%  Glycemic control for   <7.0% adults with diabetes   12/22/2018 04:37 AM 5.5 4.8 - 5.6 % Final    Comment:    (NOTE) Pre diabetes:          5.7%-6.4% Diabetes:              >6.4% Glycemic control for   <7.0% adults with diabetes     CBG: Recent Labs  Lab 06/10/20 1137 06/10/20 1552 06/10/20 2056 06/10/20 2349 06/11/20 0357  GLUCAP 133* 106* 93 128* 115*     Independent critical care time:   Chari Manning, D.O.  Internal Medicine Resident, PGY-2 [facility]  Pager: [phone] 5:56 AM, 06/11/2020

## 2020-06-11 NOTE — Progress Notes (Signed)
eLink Physician-Brief Progress Note Patient Name: Kathleen Cameron DOB: 13-Aug-1965 MRN: [id number]   Date of Service  06/11/2020  HPI/Events of Note  Nursing request for AM labs.   eICU Interventions  Will order CBC with platelets, BMP and Mg++ level at 5 AM.      Intervention Category Major Interventions: Other:  [name] 06/11/2020, 3:30 AM

## 2020-06-11 NOTE — Progress Notes (Signed)
Occupational Therapy Treatment Patient Details Name: Kathleen Cameron MRN: [id number] DOB: 10-May-1965 Today's Date: 06/11/2020    History of present illness 55 y.o. female admitted 2/13 from home with respiratory distress due to home vent malfunction after same day D/C from [facility] with home vent. PMHx pt with [facility] admission 11/6 with trach 03/02/20 ant transition to [facility] 11/18-2/12, 04/25/19 Gtube placed, COPD, hypertension, depression/anxiety, HLD, shingles infection,   OT comments  Pt with bed rest orders lifted since eval. Pt requiring totalA for ADL, mostly pericare today. Pt totalA for ADL at this time. pt appears very alert and wanting to assist, but very weak. MaxA for rolling and totalA for scooting in bed. Pt briefly at EOB with maxA, but fatigued from sitting in recliner x3 hours. Pt would benefit from continued OT skilled services. OT following acutely.  Pt on home vent with AC support, peep 5, RR 16 with pt breating at 25 RR  SpO2 100%  HR 113  BP 95/78 (78)   Follow Up Recommendations  Home health OT;Supervision/Assistance - 24 hour    Equipment Recommendations  None recommended by OT    Recommendations for Other Services      Precautions / Restrictions Precautions Precautions: Fall Precaution Comments: trach, vent Restrictions Weight Bearing Restrictions: No       Mobility Bed Mobility Overal bed mobility: Needs Assistance Bed Mobility: Sit to Supine;Rolling Rolling: Max assist     Sit to supine: Max assist   General bed mobility comments: Pt rolling with cues for sequencing and to assist BUEs to rail; pt totalA for scooting side to side and scooting up to [facility].    Transfers Overall transfer level: Needs assistance   Transfers: Sit to/from [employer] Transfers Sit to Stand: Total assist;+2 physical assistance;+2 safety/equipment   Squat pivot transfers: Total assist;+2 physical assistance;+2 safety/equipment     General transfer comment:  pad used to cradle bottom, but pt not assisting and RN and OT scooting/ squat pivotting pt to bed that was slightly higher than recliner. totalA to scoot hips back further in bed.    Balance Overall balance assessment: Needs assistance   Sitting balance-Leahy Scale: Poor Sitting balance - Comments: EOB maxA due to fatigue after sitting in recliner x3 hours. Postural control: Posterior lean                                 ADL either performed or assessed with clinical judgement   ADL Overall ADL's : Needs assistance/impaired Eating/Feeding: NPO                       Toilet Transfer: Total assistance;+2 for physical assistance;+2 for safety/equipment;Squat-pivot Toilet Transfer Details (indicate cue type and reason): Pt unable to bear any weight on BLEs; using pad to scoot /squat pivot from recliner to bed Toileting- Clothing Manipulation and Hygiene: Total assistance Toileting - Clothing Manipulation Details (indicate cue type and reason): Pt unaware of BM, but it was everywhere. RN assisting from recliner and change of linens x2 times in bed. totalA for all     Functional mobility during ADLs: Total assistance;Maximal assistance;+2 for physical assistance;+2 for safety/equipment (rolling side to side) General ADL Comments: Pt with bed rest orders lifted since eval. Pt requiring totalA for ADL, mostly pericare today. Pt totalA for ADL at this time. pt appears very alert and wanting to assist, but very weak     Vision  Baseline Vision/History: No visual deficits Patient Visual Report: No change from baseline     Perception     Praxis      Cognition Arousal/Alertness: Awake/alert Behavior During Therapy: WFL for tasks assessed/performed Overall Cognitive Status: Difficult to assess                                 General Comments: pt nodding appropriately to statements and smiling with limited ability to communicate due to trach/vent         Exercises     Shoulder Instructions       General Comments Pt on home vent with AC support, peep 5, RR 16 with pt breating at 25 RR  SpO2 100%  HR 113  BP 95/78 (78); spouse in room and reports that he wants the patient to return home with DME    Pertinent Vitals/ Pain       Pain Assessment: No/denies pain  Home Living Family/patient expects to be discharged to:: Private residence Living Arrangements: Spouse/significant other Available Help at Discharge: Family;Available 24 hours/day Type of Home: Apartment Home Access: Stairs to enter Entrance Stairs-Number of Steps: 1 Entrance Stairs-Rails: Right Home Layout: One level     Bathroom Shower/Tub: Chief Strategy Officer: Standard Bathroom Accessibility: Yes   Home Equipment: Grab bars - tub/shower;Walker - 4 wheels;Transport chair;Bedside commode;Hospital bed;Wheelchair - Careers adviser (comment)   Additional Comments: hoyer lift      Prior Functioning/Environment Level of Independence: Needs assistance  Gait / Transfers Assistance Needed: hoyer lift at home to get to [facility], assist to EOB ADL's / Homemaking Assistance Needed: assist for all ADLs Communication / Swallowing Assistance Needed: limited due to recent trach, on vent Comments: [facility] mobility [facility] to just getting to EOB and chair had just gotten home from [facility].   Frequency  Min 2X/week        Progress Toward Goals  OT Goals(current goals can now be found in the care plan section)     Acute Rehab OT Goals Patient Stated Goal: agreeable to work toward sitting and moving OT Goal Formulation: With patient Potential to Achieve Goals: Fair ADL Goals Pt Will Perform Grooming: with mod assist;bed level Pt Will Perform Upper Body Bathing: with mod assist;bed level Additional ADL Goal #1: Pt will sit on EOB for 2 minutes with mod assist in prep for adls on the EOB. Additional ADL Goal #2: Pt will sit EOB and reach  for items on bedside table with assist for balance only.  Plan      Co-evaluation                 AM-PAC OT "6 Clicks" Daily Activity     Outcome Measure   Help from another person eating meals?: Total Help from another person taking care of personal grooming?: Total Help from another person toileting, which includes using toliet, bedpan, or urinal?: Total Help from another person bathing (including washing, rinsing, drying)?: Total Help from another person to put on and taking off regular upper body clothing?: Total Help from another person to put on and taking off regular lower body clothing?: Total 6 Click Score: 6    End of Session Equipment Utilized During Treatment: Other (comment) (vent)  OT Visit Diagnosis: Muscle weakness (generalized) (M62.81);Pain Pain - part of body:  (generalized)   Activity Tolerance Patient limited by pain;Treatment limited secondary to  medical complications (Comment)   Patient Left in bed;with call bell/phone within reach;with bed alarm set;with family/visitor present   Nurse Communication Mobility status        Time: 7048-8891 OT Time Calculation (min): 45 min  Charges: OT General Charges $OT Visit: 1 Visit OT Treatments $Self Care/Home Management : 23-37 mins $Therapeutic Activity: 8-22 mins  Flora Lipps, OTR/L Acute Rehabilitation Services Pager: [phone] Office: [phone]    Stone Spirito C 06/11/2020, 5:43 PM

## 2020-06-11 NOTE — Progress Notes (Signed)
Physical Therapy Treatment Patient Details Name: Kathleen Cameron MRN: [id number] DOB: 1966-01-02 Today's Date: 06/11/2020    History of Present Illness 55 y.o. female admitted 2/13 from home with respiratory distress due to home vent malfunction after same day D/C from [facility] with home vent. PMHx pt with [facility] admission 11/6 with trach 03/02/20 ant transition to [facility] 11/18-2/12, 04/25/19 Gtube placed, COPD, hypertension, depression/anxiety, HLD, shingles infection,    PT Comments    Pt pleasant, smiling, joking with staff and able to progress to sitting EOB without assist and pivot to chair. Pt happy to get OOB and able to perform bil LE and UE HEP with encouragement to continue to move extremities for strengthening. Will continue to follow.   Pt on home vent with AC support, peep 5, RR 12 with pt breating at 25 RR SpO2 100% HR 113 BP 91/70 (78)    Follow Up Recommendations  SNF;Supervision/Assistance - 24 hour     Equipment Recommendations  None recommended by PT    Recommendations for Other Services       Precautions / Restrictions Precautions Precautions: Fall Precaution Comments: trach, vent    Mobility  Bed Mobility Overal bed mobility: Needs Assistance Bed Mobility: Supine to Sit     Supine to sit: Mod assist     General bed mobility comments: cues for sequence with assist to pivot legs and elevate trunk to pivot to right toward home vent. Pt able to sit without assist EOB 5 min    Transfers Overall transfer level: Needs assistance   Transfers: Sit to/from [employer] Transfers Sit to Stand: Max assist;+2 physical assistance   Squat pivot transfers: Max assist;+2 physical assistance     General transfer comment: physical assist with pad cradling sacrum to pivot from bed to chair and scoot back on surface  Ambulation/Gait             General Gait Details: unable   Stairs             Wheelchair Mobility    Modified Rankin  (Stroke Patients Only)       Balance Overall balance assessment: Needs assistance   Sitting balance-Leahy Scale: Good Sitting balance - Comments: EOB 5 min without physical assist                                    Cognition Arousal/Alertness: Awake/alert Behavior During Therapy: WFL for tasks assessed/performed Overall Cognitive Status: Difficult to assess                                 General Comments: pt nodding appropriately to statements and smiling with limited ability to communicate due to trach/vent      Exercises General Exercises - Upper Extremity Shoulder Flexion: AAROM;10 reps;Both;Seated General Exercises - Lower Extremity Long Arc QuadBarbaraann Boys;Both;10 reps;Seated Hip Flexion/Marching: AAROM;Both;Seated;10 reps    General Comments        Pertinent Vitals/Pain Pain Assessment: No/denies pain    Home Living                      Prior Function            PT Goals (current goals can now be found in the care plan section) Progress towards PT goals: Progressing toward goals    Frequency    Min 2X/week  PT Plan Current plan remains appropriate    Co-evaluation              AM-PAC PT "6 Clicks" Mobility   Outcome Measure  Help needed turning from your back to your side while in a flat bed without using bedrails?: A Lot Help needed moving from lying on your back to sitting on the side of a flat bed without using bedrails?: A Lot Help needed moving to and from a bed to a chair (including a wheelchair)?: Total Help needed standing up from a chair using your arms (e.g., wheelchair or bedside chair)?: Total Help needed to walk in hospital room?: Total Help needed climbing 3-5 steps with a railing? : Total 6 Click Score: 8    End of Session   Activity Tolerance: Patient tolerated treatment well Patient left: in chair;with call bell/phone within reach;with chair alarm set Nurse Communication: Mobility  status PT Visit Diagnosis: Other abnormalities of gait and mobility (R26.89);Difficulty in walking, not elsewhere classified (R26.2);Muscle weakness (generalized) (M62.81)     Time: 9470-9628 PT Time Calculation (min) (ACUTE ONLY): 25 min  Charges:  $Therapeutic Exercise: 8-22 mins $Therapeutic Activity: 8-22 mins                     Senta Kantor P, PT Acute Rehabilitation Services Pager: [phone] Office: [phone]    Riddhi Grether B Vi Biddinger 06/11/2020, 10:11 AM

## 2020-06-12 ENCOUNTER — Inpatient Hospital Stay (HOSPITAL_COMMUNITY): Payer: No Typology Code available for payment source

## 2020-06-12 DIAGNOSIS — J9621 Acute and chronic respiratory failure with hypoxia: Secondary | ICD-10-CM | POA: Diagnosis not present

## 2020-06-12 DIAGNOSIS — J9622 Acute and chronic respiratory failure with hypercapnia: Secondary | ICD-10-CM | POA: Diagnosis not present

## 2020-06-12 DIAGNOSIS — R609 Edema, unspecified: Secondary | ICD-10-CM

## 2020-06-12 LAB — GLUCOSE, CAPILLARY
Glucose-Capillary: 96 mg/dL (ref 70–99)
Glucose-Capillary: 79 mg/dL (ref 70–99)
Glucose-Capillary: 94 mg/dL (ref 70–99)
Glucose-Capillary: 80 mg/dL (ref 70–99)
Glucose-Capillary: 117 mg/dL — ABNORMAL HIGH (ref 70–99)
Glucose-Capillary: 112 mg/dL — ABNORMAL HIGH (ref 70–99)
Glucose-Capillary: 102 mg/dL — ABNORMAL HIGH (ref 70–99)
Glucose-Capillary: 132 mg/dL — ABNORMAL HIGH (ref 70–99)

## 2020-06-12 LAB — CBC
HCT: 24.4 % — ABNORMAL LOW (ref 36.0–46.0)
Hemoglobin: 7.6 g/dL — ABNORMAL LOW (ref 12.0–15.0)
MCH: 28.8 pg (ref 26.0–34.0)
MCHC: 31.1 g/dL (ref 30.0–36.0)
MCV: 92.4 fL (ref 80.0–100.0)
Platelets: 157 10*3/uL (ref 150–400)
RBC: 2.64 MIL/uL — ABNORMAL LOW (ref 3.87–5.11)
RDW: 18 % — ABNORMAL HIGH (ref 11.5–15.5)
WBC: 8.8 10*3/uL (ref 4.0–10.5)
nRBC: 0.2 % (ref 0.0–0.2)

## 2020-06-12 LAB — BASIC METABOLIC PANEL
Anion gap: 9 (ref 5–15)
BUN: 22 mg/dL — ABNORMAL HIGH (ref 6–20)
CO2: 35 mmol/L — ABNORMAL HIGH (ref 22–32)
Calcium: 9.1 mg/dL (ref 8.9–10.3)
Chloride: 96 mmol/L — ABNORMAL LOW (ref 98–111)
Creatinine, Ser: 0.32 mg/dL — ABNORMAL LOW (ref 0.44–1.00)
GFR, Estimated: >60 mL/min (ref >60)
Glucose, Bld: 92 mg/dL (ref 70–99)
Potassium: 3.7 mmol/L (ref 3.5–5.1)
Sodium: 140 mmol/L (ref 135–145)

## 2020-06-12 MED ORDER — APIXABAN 5 MG PO TABS
5.0000 mg | ORAL_TABLET | Freq: Two times a day (BID) | ORAL | Status: DC
  Administered 2020-06-19 – 2020-06-22 (×6): 5 mg
  Filled 2020-06-12 (×7): qty 1

## 2020-06-12 MED ORDER — DEXTROSE 50 % IV SOLN
INTRAVENOUS | Status: AC
  Filled 2020-06-12: qty 50

## 2020-06-12 MED ORDER — PROSOURCE TF PO LIQD
45.0000 mL | Freq: Every day | ORAL | Status: DC
  Administered 2020-06-12 – 2020-06-18 (×7): 45 mL
  Filled 2020-06-12 (×6): qty 45

## 2020-06-12 MED ORDER — APIXABAN 5 MG PO TABS
10.0000 mg | ORAL_TABLET | Freq: Two times a day (BID) | ORAL | Status: AC
  Administered 2020-06-12 – 2020-06-19 (×13): 10 mg
  Filled 2020-06-12 (×13): qty 2

## 2020-06-12 NOTE — Plan of Care (Signed)
  Problem: Respiratory: Goal: Ability to maintain a clear airway and adequate ventilation will improve Outcome: Progressing Note: Pt tolerating home vent   Problem: Education: Goal: Knowledge of General Education information will improve Description: Including pain rating scale, medication(s)/side effects and non-pharmacologic comfort measures Outcome: Progressing   Problem: Health Behavior/Discharge Planning: Goal: Ability to manage health-related needs will improve Outcome: Progressing Note: Discussed with patient tube feeding and management

## 2020-06-12 NOTE — Progress Notes (Signed)
Upper extremity venous LT study completed.  Preliminary results relayed to Lequita Halt, RN.   See CV Proc for preliminary results report.   Jean Rosenthal, RDMS

## 2020-06-12 NOTE — Progress Notes (Signed)
NAME:  Kathleen Cameron, MRN:  [id number], DOB:  09-10-1965, LOS: 9 ADMISSION DATE:  06/03/2020, CONSULTATION DATE:  06/02/2020 REFERRING MD:  Dr. Pilar Plate, CHIEF COMPLAINT: Acute respiratory distress  Brief History:  55 year old female presented from home via EMS with complaints of acute respiratory distress.  [facility]  PCCM consulted for further management and admission.  History of Present Illness:  Kathleen Cameron is a 55 y.o. with a past medical history significant for seizure disorder, hypertension, severe COPD, acute on chronic hypoxic and hypercapnic respiratory failure, and anxiety who presented to the ED via EMS with acute on chronic hypoxic and hypercapnic respiratory failure.  Of note patient was recently discharged from [facility] 2 days prior to admission.  Primary history obtained from chart review as patient is currently on ventilator through chronic trach.  It appears patient was treated at this facility November 2021 for acute on chronic hypoxic respiratory failure she required endotracheal intubation and eventual tracheostomy due to difficulty weaning 03/02/2020.  On [facility]  Upon discharge home per chart review it appears patient's home ventilator malfunctioned, which resulted in respiratory failure and need for bag mask ventilation.  On arrival to ED patient was placed on ventilator with home vent settings with no further acute respiratory compromise.  PCCM was called for further management and admission.   Past Medical History:  Seizure disorder Hypertension Severe COPD Acute on chronic hypoxic and hypercapnic respiratory failure Anxiety  Significant Hospital Events:  Admitted 2/13  Consults:  TOC, PT/OT  Procedures:  n/a  Significant Diagnostic Tests:  Chest x-ray 2/13 > worsening left perihilar airspace disease with increased patchy opacities and bronchial thickening suspicious for  worsening pneumonia  Micro Data:  Covid 2/13 > negative  2/13 BCID - staph species  Blood culture 2/13 > GPC in clusters (Staph Hominis) (2x), NG 1d (2x) Sputum culture 2/13 > normal flora MRSA PCR 2/13 > negative  BCx 2/16 - NG at 5 days  Antimicrobials:  Cefepime 2/13 > 2/19 Vancomycin 2/13 - 2/13  Interim History / Subjective:   No overnight events.  She is resting comfortably on home ventilator. Denies any complaints today.  Objective   Blood pressure 90/63, pulse 94, temperature 98.3 F (36.8 C), temperature source Oral, resp. rate 16, height 5\' 4"  (1.626 m), weight 52.6 kg, SpO2 99 %.    Vent Mode: AC Set Rate:  [16 bmp] 16 bmp Vt Set:  [480 mL] 480 mL PEEP:  [5 cmH20] 5 cmH20   Intake/Output Summary (Last 24 hours) at 06/12/2020 0606 Last data filed at 06/12/2020 0033 Gross per 24 hour  Intake 1758.72 ml  Output 1060 ml  Net 698.72 ml   Filed Weights   06/10/20 0356 06/11/20 0358 06/12/20 0436  Weight: 53.2 kg 52.5 kg 52.6 kg    Examination: General: Chronically ill, ventilated, comfortable HEENT: Tracheostomy in place, clean and dry, oropharynx dry Neck: minimal secretions around trach site Neuro: Wakes to voice, tracks, and follows commands, less sleepy today CV: Regular, borderline tachycardic, no murmur PULM: Scattered bilateral expiratory rhonchi that have improved without wheezing GI: Nondistended, positive bowel sounds, PEG in place Extremities: No cyanosis, with 1-2+ edema in lower extremities.    Resolved Hospital Problem list   Severe sepsis  Assessment & Plan:   Acute on chronic hypoxic and hypercapnic respiratory failure due to COPD, vent dependetn. Chronic MV with vent malfunctioned (totally turned off- confirmed by home respiratory company).  Progressive VAP/HAP,  Staph Hominis (presumed contaminant) [facility] with [facility] of [facility]. Patient's MV malfunctioned at home/shut off. Pneumonia progressed causing  hypoxic/hypercarbic respiratory failure.  Now treated -Prednisone 5 days completed on 2/20 -Cefepime 7 days for presumed HCAP completed 2/19 -COPD/bronchodilators: Roxy Manns, budesonide -Minimize sedation as able - Will D/c home with vent once insurance approves HH orders. - PT/OT  HTN: - Hypotension overnight. - D/c'd home medications - Labetalol as needed -Telemetry monitoring  Hypothyroidism -Continue Synthroid  At risk malnutrition Generalized weakness: -Bolus tube feeding via PEG -PT/OT  Encephalopathy, anxiety -Continue Restoril, Effexor-scheduled clonazepam  Best practice (evaluated daily)  Diet: Tube feeds resumed through PEG Pain/Anxiety/Delirium protocol (if indicated): In place VAP protocol (if indicated): In place DVT prophylaxis: Subcu heparin GI prophylaxis: PPI Glucose control: Monitor Mobility: Bedrest Disposition: ICU  Goals of Care:  Last date of multidisciplinary goals of care discussion: Pending Family and staff present: Pending Summary of discussion: Pending Follow up goals of care discussion due: Pending Code Status: Full Family: called husband Earvin Hansen 2/19   Labs   CBC: Recent Labs  Lab 06/06/20 0707 06/07/20 0915 06/08/20 0615 06/10/20 0211 06/11/20 0728  WBC 13.2* 15.6* 11.0* 12.3* 11.6*  NEUTROABS  --   --   --   --  9.7*  HGB 10.5* 10.6* 9.5* 8.8* 8.0*  HCT 33.9* 34.4* 31.2* 29.9* 27.0*  MCV 92.6 93.0 93.4 94.3 93.1  PLT 252 281 211 198 170    Basic Metabolic Panel: Recent Labs  Lab 06/06/20 0707 06/07/20 0915 06/07/20 1330 06/08/20 0615 06/10/20 0211 06/11/20 0728  NA 145 141  --  140 141 141  K 4.3 4.0  --  4.2 4.0 3.8  CL 100 96*  --  95* 94* 95*  CO2 35* 37*  --  36* 39* 35*  GLUCOSE 156* 217*  --  142* 161* 89  BUN 20 21*  --  23* 23* 23*  CREATININE 0.31* 0.38*  --  0.30* 0.36* 0.34*  CALCIUM 9.0 8.8*  --  8.6* 9.1 9.3  MG  --   --  1.9  --  2.2 2.0  PHOS  --   --  2.0*  --   --   --     GFR: Estimated Creatinine Clearance: 65.2 mL/min (A) (by C-G formula based on SCr of 0.34 mg/dL (L)). Recent Labs  Lab 06/06/20 0707 06/07/20 0915 06/08/20 0615 06/10/20 0211 06/11/20 0728  PROCALCITON 0.41  --   --   --   --   WBC 13.2* 15.6* 11.0* 12.3* 11.6*    Liver Function Tests: Recent Labs  Lab 06/05/20 0800 06/06/20 0707 06/08/20 0615  AST 20 30 35  ALT 49* 42 48*  ALKPHOS 133* 130* 98  BILITOT 0.4 0.7 0.6  PROT 5.8* 6.1* 5.3*  ALBUMIN 2.4* 2.2* 2.2*   No results for input(s): LIPASE, AMYLASE in the last 168 hours. No results for input(s): AMMONIA in the last 168 hours.  ABG    Component Value Date/Time   PHART 7.495 (H) 05/19/2020 1055   PCO2ART 47.1 05/19/2020 1055   PO2ART 155 (H) 05/19/2020 1055   HCO3 33.0 (H) 06/03/2020 0052   TCO2 35 (H) 06/03/2020 0052   O2SAT 99.0 06/03/2020 0052     Coagulation Profile: No results for input(s): INR, PROTIME in the last 168 hours.  Cardiac Enzymes: No results for input(s): CKTOTAL, CKMB, CKMBINDEX, TROPONINI in the last 168 hours.  HbA1C: Hgb A1c MFr Bld  Date/Time Value Ref Range  Status  02/26/2020 05:18 PM 5.4 4.8 - 5.6 % Final    Comment:    (NOTE) Pre diabetes:          5.7%-6.4%  Diabetes:              >6.4%  Glycemic control for   <7.0% adults with diabetes   12/22/2018 04:37 AM 5.5 4.8 - 5.6 % Final    Comment:    (NOTE) Pre diabetes:          5.7%-6.4% Diabetes:              >6.4% Glycemic control for   <7.0% adults with diabetes     CBG: Recent Labs  Lab 06/11/20 1937 06/11/20 2339 06/12/20 0216 06/12/20 0409 06/12/20 0434  GLUCAP 153* 88 96 79 94    Independent critical care time:   Chari Manning, D.O.  Internal Medicine Resident, PGY-2 [facility]  Pager: [phone] 6:06 AM, 06/12/2020

## 2020-06-12 NOTE — Progress Notes (Signed)
Initial Nutrition Assessment  DOCUMENTATION CODES:   Non-severe (moderate) malnutrition in context of chronic illness  INTERVENTION:   Tube feeding via PEG: Continue bolus feedings: Osmolite 1.5 237 ml (1 carton) 5 times per day (1185 ml per day) Add Prosource TF 45 ml once daily  Provides 1815 kcal, 86 gm protein, 905 ml free water daily  Free water flushes 80 ml before and after each bolus feeding for an additional 800 ml free water daily, 1705 ml total.   NUTRITION DIAGNOSIS:   Moderate Malnutrition related to chronic illness (COPD) as evidenced by mild muscle depletion,mild fat depletion.  Ongoing  GOAL:   Patient will meet greater than or equal to 90% of their needs  Met with TF  MONITOR:   Vent status,TF tolerance,Labs  REASON FOR ASSESSMENT:   Ventilator,Consult Enteral/tube feeding initiation and management  ASSESSMENT:   55 yo female admitted from home with ventilator malfunction. She was discharged home from [place] and required re-admission to [facility] on the same day. PMH includes HTN, asthma, COPD, seizure disorder, anxiety disorder.   Discussed patient in ICU rounds and with RN today. Tolerating bolus TF via PEG: Osmolite 1.5, 1 carton 5 times daily, free water flushes 80 ml before and after each bolus.  Patient is on ventilator support via home vent. MV: 7.7 L/min Temp (24hrs), Avg:98.2 F (36.8 C), Min:97 F (36.1 C), Max:99.2 F (37.3 C)   Labs reviewed.  Medications reviewed and include Colace, MVI with minerals daily.   Diet Order:   Diet Order            Diet NPO time specified  Diet effective now                 EDUCATION NEEDS:   Not appropriate for education at this time  Skin:  Skin Assessment: Skin Integrity Issues: Skin Integrity Issues:: Stage II Stage II: R buttocks, R ankle  Last BM:  2/21 type 6  Height:   Ht Readings from Last 1 Encounters:  06/03/20 $RemoveB'5\' 4"'mPxNWLGX$  (1.626 m)    Weight:   Wt Readings from Last 1  Encounters:  06/12/20 52.6 kg    BMI:  Body mass index is 19.9 kg/m.  Estimated Nutritional Needs:   Kcal:  1400-1600  Protein:  80-95 gm  Fluid:  >/= 1.5 L    Kathleen Cameron, RD, LDN, CNSC Please refer to Amion for contact information.

## 2020-06-13 ENCOUNTER — Ambulatory Visit: Payer: Commercial Managed Care - PPO | Admitting: Emergency Medicine

## 2020-06-13 DIAGNOSIS — J9621 Acute and chronic respiratory failure with hypoxia: Secondary | ICD-10-CM | POA: Diagnosis not present

## 2020-06-13 DIAGNOSIS — J9622 Acute and chronic respiratory failure with hypercapnia: Secondary | ICD-10-CM | POA: Diagnosis not present

## 2020-06-13 LAB — GLUCOSE, CAPILLARY
Glucose-Capillary: 102 mg/dL — ABNORMAL HIGH (ref 70–99)
Glucose-Capillary: 81 mg/dL (ref 70–99)
Glucose-Capillary: 133 mg/dL — ABNORMAL HIGH (ref 70–99)
Glucose-Capillary: 94 mg/dL (ref 70–99)
Glucose-Capillary: 95 mg/dL (ref 70–99)
Glucose-Capillary: 148 mg/dL — ABNORMAL HIGH (ref 70–99)

## 2020-06-13 MED ORDER — ACETAMINOPHEN 160 MG/5ML PO SOLN
650.0000 mg | ORAL | Status: DC | PRN
  Administered 2020-06-13 – 2020-07-01 (×4): 650 mg
  Filled 2020-06-13 (×5): qty 20.3

## 2020-06-13 NOTE — Progress Notes (Signed)
eLink Physician-Brief Progress Note Patient Name: Kathleen Cameron DOB: Aug 25, 1965 MRN: [id number]   Date of Service  06/13/2020  HPI/Events of Note  Patient is asking for a PRN order for Tylenol for a mild headache.  eICU Interventions  PRN Tylenol ordered via NG tube.        [name] [name] 06/13/2020, 8:25 PM

## 2020-06-13 NOTE — Progress Notes (Addendum)
NAME:  Kathleen Cameron, MRN:  [id number], DOB:  July 30, 1965, LOS: 10 ADMISSION DATE:  06/03/2020, CONSULTATION DATE:  06/02/2020 REFERRING MD:  Dr. Pilar Plate, CHIEF COMPLAINT: Acute respiratory distress  Brief History:  55 year old female presented from home via EMS with complaints of acute respiratory distress.  [facility]  PCCM consulted for further management and admission.  History of Present Illness:  Kathleen Cameron is a 55 y.o. with a past medical history significant for seizure disorder, hypertension, severe COPD, acute on chronic hypoxic and hypercapnic respiratory failure, and anxiety who presented to the ED via EMS with acute on chronic hypoxic and hypercapnic respiratory failure.  Of note patient was recently discharged from [facility] 2 days prior to admission.  Primary history obtained from chart review as patient is currently on ventilator through chronic trach.  It appears patient was treated at this facility November 2021 for acute on chronic hypoxic respiratory failure she required endotracheal intubation and eventual tracheostomy due to difficulty weaning 03/02/2020.  On [facility]  Upon discharge home per chart review it appears patient's home ventilator malfunctioned, which resulted in respiratory failure and need for bag mask ventilation.  On arrival to ED patient was placed on ventilator with home vent settings with no further acute respiratory compromise.  PCCM was called for further management and admission.   Past Medical History:  Seizure disorder Hypertension Severe COPD Acute on chronic hypoxic and hypercapnic respiratory failure Anxiety  Significant Hospital Events:  Admitted 2/13  Consults:  TOC, PT/OT  Procedures:  n/a  Significant Diagnostic Tests:  Chest x-ray 2/13 > worsening left perihilar airspace disease with increased patchy opacities and bronchial thickening suspicious for  worsening pneumonia  Vasc US (DVT) upper ext: Right:  No evidence of thrombosis in the subclavian.    Left:  Findings consistent with a small, focal age indeterminate deep vein  thrombus  involving the left subclavian vein. Findings consistent with age  indeterminate  superficial vein thrombosis involving the left cephalic vein at the  forearm.  Micro Data:  Covid 2/13 > negative  2/13 BCID - staph species  Blood culture 2/13 > GPC in clusters (Staph Hominis) (2x), NG 1d (2x) Sputum culture 2/13 > normal flora MRSA PCR 2/13 > negative  BCx 2/16 - NG at 5 days  Antimicrobials:  Cefepime 2/13 > 2/19 Vancomycin 2/13 - 2/13  Interim History / Subjective:   No overnight events.  She is resting comfortably on home ventilator. Denies any complaints today.  Objective   Blood pressure 91/70, pulse 90, temperature 97.8 F (36.6 C), temperature source Oral, resp. rate 16, height 5' 4.02" (1.626 m), weight 51.4 kg, SpO2 100 %.    Vent Mode: Other (Comment) FiO2 (%):  [40 %] 40 % Set Rate:  [16 bmp] 16 bmp Vt Set:  [480 mL] 480 mL PEEP:  [5 cmH20] 5 cmH20   Intake/Output Summary (Last 24 hours) at 06/13/2020 0624 Last data filed at 06/12/2020 1400 Gross per 24 hour  Intake --  Output 700 ml  Net -700 ml   Filed Weights   06/11/20 0358 06/12/20 0436 06/13/20 0500  Weight: 52.5 kg 52.6 kg 51.4 kg    Examination: General: Chronically ill, ventilated, comfortable HEENT: Tracheostomy in place, clean and dry, oropharynx dry Neck: minimal secretions around trach site Neuro: Wakes to voice, tracks, and follows commands, less sleepy today CV: Regular, borderline tachycardic, no murmur PULM: Continues to have some scattered bilateral expiratory rhonchi  that have improved without wheezing GI: Nondistended, positive bowel sounds, PEG in place Extremities: No cyanosis, with 1-2+ edema in lower extremities.    Resolved Hospital Problem list   Severe sepsis  Assessment & Plan:    Acute on chronic hypoxic and hypercapnic respiratory failure due to COPD, vent dependetn. Chronic MV with vent malfunctioned (totally turned off- confirmed by home respiratory company).  Progressive VAP/HAP, Staph Hominis (presumed contaminant) [facility] with [facility] of [facility]. Patient's MV malfunctioned at home/shut off. Pneumonia progressed causing hypoxic/hypercarbic respiratory failure.  Now treated -Prednisone 5 days completed on 2/20 -Cefepime 7 days for presumed HCAP completed 2/19 -COPD/bronchodilators: Roxy Manns, budesonide -Minimize sedation as able - Will D/c home with vent once insurance approves HH orders. - PT/OT  Lt Subclavian DVT: - On AC with eliquis - No obvious sign of bleeding.  HTN: - Hypotension overnight. - D/c'd home medications, may not require BP meds in future - Labetalol as needed - Telemetry monitoring  Hypothyroidism -Continue Synthroid  At risk malnutrition Generalized weakness: - TF via PEG - PT/OT  Encephalopathy, anxiety -Continue Restoril, Effexor-scheduled clonazepam  Best practice (evaluated daily)  Diet: Tube feeds resumed through PEG Pain/Anxiety/Delirium protocol (if indicated): In place VAP protocol (if indicated): In place DVT prophylaxis: Subcu heparin GI prophylaxis: PPI Glucose control: Monitor Mobility: Bedrest Disposition: ICU  Goals of Care:  Last date of multidisciplinary goals of care discussion: Pending Family and staff present: Pending Summary of discussion: Pending Follow up goals of care discussion due: Pending Code Status: Full Family: called husband Earvin Hansen 2/19   Labs   CBC: Recent Labs  Lab 06/07/20 0915 06/08/20 0615 06/10/20 0211 06/11/20 0728 06/12/20 0814  WBC 15.6* 11.0* 12.3* 11.6* 8.8  NEUTROABS  --   --   --  9.7*  --   HGB 10.6* 9.5* 8.8* 8.0* 7.6*  HCT 34.4* 31.2* 29.9* 27.0* 24.4*  MCV 93.0 93.4 94.3 93.1 92.4  PLT 281 211 198 170 157     Basic Metabolic Panel: Recent Labs  Lab 06/07/20 0915 06/07/20 1330 06/08/20 0615 06/10/20 0211 06/11/20 0728 06/12/20 0814  NA 141  --  140 141 141 140  K 4.0  --  4.2 4.0 3.8 3.7  CL 96*  --  95* 94* 95* 96*  CO2 37*  --  36* 39* 35* 35*  GLUCOSE 217*  --  142* 161* 89 92  BUN 21*  --  23* 23* 23* 22*  CREATININE 0.38*  --  0.30* 0.36* 0.34* 0.32*  CALCIUM 8.8*  --  8.6* 9.1 9.3 9.1  MG  --  1.9  --  2.2 2.0  --   PHOS  --  2.0*  --   --   --   --    GFR: Estimated Creatinine Clearance: 63.7 mL/min (A) (by C-G formula based on SCr of 0.32 mg/dL (L)). Recent Labs  Lab 06/06/20 0707 06/07/20 0915 06/08/20 0615 06/10/20 0211 06/11/20 0728 06/12/20 0814  PROCALCITON 0.41  --   --   --   --   --   WBC 13.2*   < > 11.0* 12.3* 11.6* 8.8   < > = values in this interval not displayed.    Liver Function Tests: Recent Labs  Lab 06/06/20 0707 06/08/20 0615  AST 30 35  ALT 42 48*  ALKPHOS 130* 98  BILITOT 0.7 0.6  PROT 6.1* 5.3*  ALBUMIN 2.2* 2.2*   No results for input(s): LIPASE, AMYLASE in the last 168  hours. No results for input(s): AMMONIA in the last 168 hours.  ABG    Component Value Date/Time   PHART 7.495 (H) 05/19/2020 1055   PCO2ART 47.1 05/19/2020 1055   PO2ART 155 (H) 05/19/2020 1055   HCO3 33.0 (H) 06/03/2020 0052   TCO2 35 (H) 06/03/2020 0052   O2SAT 99.0 06/03/2020 0052     Coagulation Profile: No results for input(s): INR, PROTIME in the last 168 hours.  Cardiac Enzymes: No results for input(s): CKTOTAL, CKMB, CKMBINDEX, TROPONINI in the last 168 hours.  HbA1C: Hgb A1c MFr Bld  Date/Time Value Ref Range Status  02/26/2020 05:18 PM 5.4 4.8 - 5.6 % Final    Comment:    (NOTE) Pre diabetes:          5.7%-6.4%  Diabetes:              >6.4%  Glycemic control for   <7.0% adults with diabetes   12/22/2018 04:37 AM 5.5 4.8 - 5.6 % Final    Comment:    (NOTE) Pre diabetes:          5.7%-6.4% Diabetes:              >6.4% Glycemic  control for   <7.0% adults with diabetes     CBG: Recent Labs  Lab 06/12/20 1138 06/12/20 1640 06/12/20 1911 06/12/20 2312 06/13/20 0308  GLUCAP 117* 112* 102* 132* 102*    Independent critical care time:   Chari Manning, D.O.  Internal Medicine Resident, PGY-2 [facility]  Pager: [phone] 6:24 AM, 06/13/2020

## 2020-06-14 ENCOUNTER — Inpatient Hospital Stay (HOSPITAL_COMMUNITY): Payer: No Typology Code available for payment source

## 2020-06-14 DIAGNOSIS — J9622 Acute and chronic respiratory failure with hypercapnia: Secondary | ICD-10-CM | POA: Diagnosis not present

## 2020-06-14 DIAGNOSIS — J9621 Acute and chronic respiratory failure with hypoxia: Secondary | ICD-10-CM | POA: Diagnosis not present

## 2020-06-14 LAB — GLUCOSE, CAPILLARY
Glucose-Capillary: 110 mg/dL — ABNORMAL HIGH (ref 70–99)
Glucose-Capillary: 115 mg/dL — ABNORMAL HIGH (ref 70–99)
Glucose-Capillary: 121 mg/dL — ABNORMAL HIGH (ref 70–99)
Glucose-Capillary: 140 mg/dL — ABNORMAL HIGH (ref 70–99)
Glucose-Capillary: 213 mg/dL — ABNORMAL HIGH (ref 70–99)
Glucose-Capillary: 68 mg/dL — ABNORMAL LOW (ref 70–99)
Glucose-Capillary: 78 mg/dL (ref 70–99)

## 2020-06-14 LAB — BASIC METABOLIC PANEL
Anion gap: 8 (ref 5–15)
BUN: 23 mg/dL — ABNORMAL HIGH (ref 6–20)
CO2: 37 mmol/L — ABNORMAL HIGH (ref 22–32)
Calcium: 9.1 mg/dL (ref 8.9–10.3)
Chloride: 98 mmol/L (ref 98–111)
Creatinine, Ser: <0.3 mg/dL — ABNORMAL LOW (ref 0.44–1.00)
Glucose, Bld: 124 mg/dL — ABNORMAL HIGH (ref 70–99)
Potassium: 4.2 mmol/L (ref 3.5–5.1)
Sodium: 143 mmol/L (ref 135–145)

## 2020-06-14 MED ORDER — DEXTROSE 50 % IV SOLN: 12.5000 g | INTRAVENOUS | Status: AC

## 2020-06-14 MED ORDER — DEXTROSE 50 % IV SOLN
INTRAVENOUS | Status: AC
  Administered 2020-06-14: 12.5 g via INTRAVENOUS
  Filled 2020-06-14: qty 50

## 2020-06-14 NOTE — Progress Notes (Signed)
Physical Therapy Treatment Patient Details Name: Kathleen Cameron MRN: [id number] DOB: Sep 11, 1965 Today's Date: 06/14/2020    History of Present Illness 55 y.o. female admitted 2/13 from home with respiratory distress due to home vent malfunction after same day D/C from [facility] with home vent. PMHx pt with [facility] admission 11/6 with trach 03/02/20 ant transition to [facility] 11/18-2/12, 04/25/19 Gtube placed, COPD, hypertension, depression/anxiety, HLD, shingles infection,    PT Comments    Pt very pleasant, joking, smiling and participative throughout. Pt with improved transfer to EOb and standing but continues to be very weak and limited with functional mobility requiring +2 assist to transfer bed to chair. Pt educated for HEP and encouraged to continue. Pt on home vent throughout SpO2 95%, FiO2 40%    Follow Up Recommendations  SNF;Supervision/Assistance - 24 hour     Equipment Recommendations  None recommended by PT    Recommendations for Other Services       Precautions / Restrictions Precautions Precautions: Fall Precaution Comments: trach, vent    Mobility  Bed Mobility Overal bed mobility: Needs Assistance Bed Mobility: Supine to Sit     Supine to sit: Min assist;HOB elevated     General bed mobility comments: cues for sequence with pt able to elevate trunk with use of rail, HOB 25 degrees with min assist to slide legs toward EOB. Increased time and assist to scoot hips fully to EOB    Transfers Overall transfer level: Needs assistance   Transfers: Sit to/from Stand;Stand Pivot Transfers Sit to Stand: Max assist;+2 physical assistance Stand pivot transfers: Total assist;+2 physical assistance       General transfer comment: bil knees blocked with use of belt pt able to stand with max +2 assist from EOB and took one step but then knees buckling and total assist to pivot to chair and total +2 to slide back in chair  Ambulation/Gait             General  Gait Details: unable   Stairs             Wheelchair Mobility    Modified Rankin (Stroke Patients Only)       Balance Overall balance assessment: Needs assistance   Sitting balance-Leahy Scale: Fair Sitting balance - Comments: EOb without assist with good sitting balance   Standing balance support: Bilateral upper extremity supported Standing balance-Leahy Scale: Zero Standing balance comment: bil UE support in standing                            Cognition Arousal/Alertness: Awake/alert Behavior During Therapy: WFL for tasks assessed/performed Overall Cognitive Status: Difficult to assess                                 General Comments: pt nodding appropriately to statements and smiling with limited ability to communicate due to trach/vent      Exercises General Exercises - Upper Extremity Shoulder Flexion: AROM;Both;5 reps;Seated General Exercises - Lower Extremity Long Arc Quad: AAROM;Both;10 reps;Seated Hip ABduction/ADduction: AAROM;Both;10 reps;Seated Hip Flexion/Marching: AAROM;Both;Seated;10 reps    General Comments        Pertinent Vitals/Pain Pain Assessment: No/denies pain    Home Living                      Prior Function  PT Goals (current goals can now be found in the care plan section) Progress towards PT goals: Progressing toward goals    Frequency    Min 2X/week      PT Plan Current plan remains appropriate    Co-evaluation              AM-PAC PT "6 Clicks" Mobility   Outcome Measure  Help needed turning from your back to your side while in a flat bed without using bedrails?: A Little Help needed moving from lying on your back to sitting on the side of a flat bed without using bedrails?: A Lot Help needed moving to and from a bed to a chair (including a wheelchair)?: Total Help needed standing up from a chair using your arms (e.g., wheelchair or bedside chair)?:  Total Help needed to walk in hospital room?: Total Help needed climbing 3-5 steps with a railing? : Total 6 Click Score: 9    End of Session Equipment Utilized During Treatment: Gait belt;Other (comment) (vent) Activity Tolerance: Patient tolerated treatment well Patient left: in chair;with call bell/phone within reach;with chair alarm set Nurse Communication: Mobility status PT Visit Diagnosis: Other abnormalities of gait and mobility (R26.89);Difficulty in walking, not elsewhere classified (R26.2);Muscle weakness (generalized) (M62.81)     Time: 0254-2706 PT Time Calculation (min) (ACUTE ONLY): 20 min  Charges:  $Therapeutic Activity: 8-22 mins                     Govanni Plemons P, PT Acute Rehabilitation Services Pager: [phone] Office: [phone]    Enedina Finner Ramil [place] 06/14/2020, 9:57 AM

## 2020-06-14 NOTE — Progress Notes (Addendum)
Hypoglycemic Event  CBG: 68  Treatment: D5 46ml IV   Symptoms: hand tremors  Follow-up CBG: [id number] CBG Result:213  Possible Reasons for Event: No TF over night  Comments/MD notified: hypoglycemic protocol followed    Sang Blount, [place]

## 2020-06-14 NOTE — Progress Notes (Signed)
NAME:  Kathleen Cameron, MRN:  [id number], DOB:  [birth year], LOS: 11 ADMISSION DATE:  06/03/2020, CONSULTATION DATE:  06/02/2020 REFERRING MD:  Dr. Pilar Plate, CHIEF COMPLAINT: Acute respiratory distress  Brief History:  55 year old female presented from home via EMS with complaints of acute respiratory distress.  [facility]  PCCM consulted for further management and admission.  History of Present Illness:  Kathleen Cameron is a 55 y.o. with a past medical history significant for seizure disorder, hypertension, severe COPD, acute on chronic hypoxic and hypercapnic respiratory failure, and anxiety who presented to the ED via EMS with acute on chronic hypoxic and hypercapnic respiratory failure.  Of note patient was recently discharged from [facility] 2 days prior to admission.  Primary history obtained from chart review as patient is currently on ventilator through chronic trach.  It appears patient was treated at this facility November 2021 for acute on chronic hypoxic respiratory failure she required endotracheal intubation and eventual tracheostomy due to difficulty weaning 03/02/2020.  On 11/18 [facility]  Upon discharge home per chart review it appears patient's home ventilator malfunctioned, which resulted in respiratory failure and need for bag mask ventilation.  On arrival to ED patient was placed on ventilator with home vent settings with no further acute respiratory compromise.  PCCM was called for further management and admission.   Past Medical History:  Seizure disorder Hypertension Severe COPD Acute on chronic hypoxic and hypercapnic respiratory failure Anxiety  Significant Hospital Events:  Admitted 2/13  Consults:  TOC, PT/OT  Procedures:  n/a  Significant Diagnostic Tests:  Chest x-ray 2/13 > worsening left perihilar airspace disease with increased patchy opacities and bronchial thickening suspicious for  worsening pneumonia  Vasc US (DVT) upper ext: Right:  No evidence of thrombosis in the subclavian.    Left:  Findings consistent with a small, focal age indeterminate deep vein  thrombus  involving the left subclavian vein. Findings consistent with age  indeterminate  superficial vein thrombosis involving the left cephalic vein at the  forearm.  Micro Data:  Covid 2/13 > negative  2/13 BCID - staph species  Blood culture 2/13 > GPC in clusters (Staph Hominis) (2x), NG 1d (2x) Sputum culture 2/13 > normal flora MRSA PCR 2/13 > negative  BCx 2/16 - NG at 5 days  Antimicrobials:  Cefepime 2/13 > 2/19 Vancomycin 2/13 - 2/13  Interim History / Subjective:   No overnight events.  She is resting comfortably on home ventilator. Denies any complaints today.  Objective   Blood pressure 117/74, pulse 94, temperature (!) 97.5 F (36.4 C), temperature source Oral, resp. rate 16, height 5' 4.02" (1.626 m), weight 53.6 kg, SpO2 99 %.    FiO2 (%):  [40 %] 40 % Set Rate:  [16 bmp] 16 bmp Vt Set:  [480 mL] 480 mL PEEP:  [5 cmH20] 5 cmH20   Intake/Output Summary (Last 24 hours) at 06/14/2020 0626 Last data filed at 06/13/2020 1900 Gross per 24 hour  Intake 794 ml  Output 450 ml  Net 344 ml   Filed Weights   06/12/20 0436 06/13/20 0500 06/14/20 0551  Weight: 52.6 kg 51.4 kg 53.6 kg    Examination: General: Chronically ill, ventilated, comfortable HEENT: Tracheostomy in place, clean and dry, oropharynx dry Neck: minimal secretions around trach site Neuro: Wakes to voice, tracks, and follows commands, less sleepy today CV: Regular, borderline tachycardic, no murmur PULM: CTAB GI: Nondistended, positive bowel sounds, PEG in place Extremities:  No cyanosis, with 1+ edema in lower extremities.    Resolved Hospital Problem list   Severe sepsis  Assessment & Plan:   Acute on chronic hypoxic and hypercapnic respiratory failure due to COPD, vent dependetn. Chronic MV with vent  malfunctioned (totally turned off- confirmed by home respiratory company).  Progressive VAP/HAP, Staph Hominis (presumed contaminant) [facility] with evidence of pneumonia. Patient's MV malfunctioned at home/shut off. Pneumonia progressed causing hypoxic/hypercarbic respiratory failure.  Now treated -Prednisone 5 days completed on 2/20 -Cefepime 7 days for presumed HCAP completed 2/19 -COPD/bronchodilators: Roxy Manns, budesonide -Minimize sedation as able - Will D/c home with vent once insurance approves HH orders. - PT/OT  Provoked Lt Subclavian DVT: - thought to be secondary to CVC - On AC with eliquis, reassess need in 3 months - No obvious sign of bleeding.  HTN: - Hypotension overnight. - D/c'd home medications, may not require BP meds in future - Labetalol as needed - Telemetry monitoring  Hypothyroidism -Continue Synthroid  At risk malnutrition Generalized weakness: - TF via PEG - PT/OT  Encephalopathy, anxiety -Continue Restoril, Effexor-scheduled clonazepam  Best practice (evaluated daily)  Diet: Tube feeds resumed through PEG Pain/Anxiety/Delirium protocol (if indicated): In place VAP protocol (if indicated): In place DVT prophylaxis: Subcu heparin GI prophylaxis: PPI Glucose control: Monitor Mobility: Bedrest Disposition: ICU  Goals of Care:  Last date of multidisciplinary goals of care discussion: Pending Family and staff present: Pending Summary of discussion: Pending Follow up goals of care discussion due: Pending Code Status: Full Family: called husband Earvin Hansen 2/19   Labs   CBC: Recent Labs  Lab 06/07/20 0915 06/08/20 0615 06/10/20 0211 06/11/20 0728 06/12/20 0814  WBC 15.6* 11.0* 12.3* 11.6* 8.8  NEUTROABS  --   --   --  9.7*  --   HGB 10.6* 9.5* 8.8* 8.0* 7.6*  HCT 34.4* 31.2* 29.9* 27.0* 24.4*  MCV 93.0 93.4 94.3 93.1 92.4  PLT 281 211 198 170 157    Basic Metabolic Panel: Recent Labs  Lab  06/07/20 1330 06/08/20 0615 06/10/20 0211 06/11/20 0728 06/12/20 0814 06/14/20 0114  NA  --  140 141 141 140 143  K  --  4.2 4.0 3.8 3.7 4.2  CL  --  95* 94* 95* 96* 98  CO2  --  36* 39* 35* 35* 37*  GLUCOSE  --  142* 161* 89 92 124*  BUN  --  23* 23* 23* 22* 23*  CREATININE  --  0.30* 0.36* 0.34* 0.32* <0.30*  CALCIUM  --  8.6* 9.1 9.3 9.1 9.1  MG 1.9  --  2.2 2.0  --   --   PHOS 2.0*  --   --   --   --   --    GFR: CrCl cannot be calculated (This lab value cannot be used to calculate CrCl because it is not a number: <0.30). Recent Labs  Lab 06/08/20 0615 06/10/20 0211 06/11/20 0728 06/12/20 0814  WBC 11.0* 12.3* 11.6* 8.8    Liver Function Tests: Recent Labs  Lab 06/08/20 0615  AST 35  ALT 48*  ALKPHOS 98  BILITOT 0.6  PROT 5.3*  ALBUMIN 2.2*   No results for input(s): LIPASE, AMYLASE in the last 168 hours. No results for input(s): AMMONIA in the last 168 hours.  ABG    Component Value Date/Time   PHART 7.495 (H) 05/19/2020 1055   PCO2ART 47.1 05/19/2020 1055   PO2ART 155 (H) 05/19/2020 1055   HCO3 33.0 (  H) 06/03/2020 0052   TCO2 35 (H) 06/03/2020 0052   O2SAT 99.0 06/03/2020 0052     Coagulation Profile: No results for input(s): INR, PROTIME in the last 168 hours.  Cardiac Enzymes: No results for input(s): CKTOTAL, CKMB, CKMBINDEX, TROPONINI in the last 168 hours.  HbA1C: Hgb A1c MFr Bld  Date/Time Value Ref Range Status  02/26/2020 05:18 PM 5.4 4.8 - 5.6 % Final    Comment:    (NOTE) Pre diabetes:          5.7%-6.4%  Diabetes:              >6.4%  Glycemic control for   <7.0% adults with diabetes   12/22/2018 04:37 AM 5.5 4.8 - 5.6 % Final    Comment:    (NOTE) Pre diabetes:          5.7%-6.4% Diabetes:              >6.4% Glycemic control for   <7.0% adults with diabetes     CBG: Recent Labs  Lab 06/13/20 1121 06/13/20 1513 06/13/20 1916 06/13/20 2312 06/14/20 0312  GLUCAP 133* 94 95 148* 140*    Independent critical  care time:   Chari Manning, D.O.  Internal Medicine Resident, PGY-2 [facility]  Pager: [phone] 6:26 AM, 06/14/2020

## 2020-06-15 DIAGNOSIS — J9622 Acute and chronic respiratory failure with hypercapnia: Secondary | ICD-10-CM | POA: Diagnosis not present

## 2020-06-15 DIAGNOSIS — J9621 Acute and chronic respiratory failure with hypoxia: Secondary | ICD-10-CM | POA: Diagnosis not present

## 2020-06-15 LAB — GLUCOSE, CAPILLARY
Glucose-Capillary: 131 mg/dL — ABNORMAL HIGH (ref 70–99)
Glucose-Capillary: 90 mg/dL (ref 70–99)
Glucose-Capillary: 103 mg/dL — ABNORMAL HIGH (ref 70–99)
Glucose-Capillary: 102 mg/dL — ABNORMAL HIGH (ref 70–99)
Glucose-Capillary: 129 mg/dL — ABNORMAL HIGH (ref 70–99)
Glucose-Capillary: 92 mg/dL (ref 70–99)

## 2020-06-15 NOTE — Procedures (Signed)
Objective Swallowing Evaluation: Type of Study: FEES   Patient Details  Name: Kathleen Cameron MRN: [id number] Date of Birth: 01-14-1966  Today's Date: 06/15/2020 Time: SLP Start Time (ACUTE ONLY): 1222 -SLP Stop Time (ACUTE ONLY): 1300  SLP Time Calculation (min) (ACUTE ONLY): 38 min   Past Medical History:  Past Medical History:  Diagnosis Date  . Acute on chronic respiratory failure with hypoxia (HCC)   . Anxiety disorder   . Asthma   . COPD (chronic obstructive pulmonary disease) (HCC)   . COPD, severe (HCC)   . Hypertension   . Seizure disorder [facility])    Past Surgical History:  Past Surgical History:  Procedure Laterality Date  . IR FLUORO RM 30-60 MIN  03/23/2020  . LAPAROSCOPIC INSERTION GASTROSTOMY TUBE N/A 04/24/2020   Procedure: LAPAROSCOPIC GASTROSTOMY TUBE PLACEMENT;  Surgeon: Sheliah Hatch, De Blanch, MD;  Location: [facility] OR;  Service: General;  Laterality: N/A;  . WISDOM TOOTH EXTRACTION     HPI: 55 year old female presented from home via EMS with complaints of acute respiratory distress after home ventilator failed.  [facility]  PCCM consulted for further management and admission. Diagnosed with HCAP and COPD exacerbation on admission. Pt is vent and PEG dependent at baseline. CXR 2/24: "Improved left lung aeration with persistent patchy  atelectasis/consolidation at the base. Similar trace pleural  effusions"   Subjective: Pt awake, alert, pleasant, participative    Assessment / Plan / Recommendation  CHL IP CLINICAL IMPRESSIONS 06/15/2020  Clinical Impression Pt presents with mild oropharyngeal dysphagia in setting of ventilator dependence. These deficits resulted in moderate pharyngeal residue when consuming PO trials without PMSV in place.  RT was present for majority of evaluation, deflating cuff prior to PO trials and re-inflating cuff prior to departure.  Pt was given trials with in-line PMSV in place, and without PMSV with cuff both  deflated and inflated.  With PMSV in place swallow function was grossly normal.  Without speaking valve in place, there was transient penetration of thin liquid dairy on initial trial only.  Without PMSV pharyngeal residuals increased with cuff in both inflated and deflated position.  Pt was able to clear pharyngeal residue by following liquid and puree with solid bolus and with effortful swallow.  There was no aspiration of any consistency trialed, including thin liquid dairy (ensure) by straw sip.  Recommend regular texture diet with thin liquids.  Pt may need to rely on PEG for nutritional support, and medications can continue to be administered via alternate means.  SLP Visit Diagnosis --  Attention and concentration deficit following --  Frontal lobe and executive function deficit following --  Impact on safety and function --      CHL IP TREATMENT RECOMMENDATION 06/15/2020  Treatment Recommendations Therapy as outlined in treatment plan below     No flowsheet data found.  CHL IP DIET RECOMMENDATION 06/15/2020  SLP Diet Recommendations Regular solids;Thin liquid  Liquid Administration via Cup;Straw  Medication Administration Via alternative means  Compensations Slow rate;Small sips/bites;Effortful swallow  Postural Changes Seated upright at 90 degrees      CHL IP OTHER RECOMMENDATIONS 06/15/2020  Recommended Consults --  Oral Care Recommendations Oral care BID  Other Recommendations --      CHL IP FOLLOW UP RECOMMENDATIONS 06/15/2020  Follow up Recommendations (No Data)      CHL IP FREQUENCY AND DURATION 06/15/2020  Speech Therapy Frequency (ACUTE ONLY) min 2x/week  Treatment Duration 2 weeks  CHL IP ORAL PHASE 06/15/2020  Oral Phase WFL  Oral - Pudding Teaspoon --  Oral - Pudding Cup --  Oral - Honey Teaspoon --  Oral - Honey Cup --  Oral - Nectar Teaspoon --  Oral - Nectar Cup --  Oral - Nectar Straw --  Oral - Thin Teaspoon --  Oral - Thin Cup --  Oral -  Thin Straw --  Oral - Puree --  Oral - Mech Soft --  Oral - Regular --  Oral - Multi-Consistency --  Oral - Pill --  Oral Phase - Comment --    CHL IP PHARYNGEAL PHASE 06/15/2020  Pharyngeal Phase Impaired  Pharyngeal- Pudding Teaspoon --  Pharyngeal --  Pharyngeal- Pudding Cup --  Pharyngeal --  Pharyngeal- Honey Teaspoon --  Pharyngeal --  Pharyngeal- Honey Cup --  Pharyngeal --  Pharyngeal- Nectar Teaspoon --  Pharyngeal --  Pharyngeal- Nectar Cup --  Pharyngeal --  Pharyngeal- Nectar Straw --  Pharyngeal --  Pharyngeal- Thin Teaspoon WFL  Pharyngeal Material does not enter airway  Pharyngeal- Thin Cup [facility]  Pharyngeal Material does not enter airway  Pharyngeal- Thin Straw WFL  Pharyngeal Material enters airway, remains ABOVE vocal cords then ejected out  Pharyngeal- Puree Pharyngeal residue - pyriform;Pharyngeal residue - valleculae  Pharyngeal Material does not enter airway  Pharyngeal- Mechanical Soft --  Pharyngeal --  Pharyngeal- Regular Delayed swallow initiation-pyriform sinuses;Pharyngeal residue - valleculae;Pharyngeal residue - pyriform  Pharyngeal Material does not enter airway  Pharyngeal- Multi-consistency --  Pharyngeal --  Pharyngeal- Pill --  Pharyngeal --  Pharyngeal Comment --     CHL IP CERVICAL ESOPHAGEAL PHASE 06/15/2020  Cervical Esophageal Phase WFL  Pudding Teaspoon --  Pudding Cup --  Honey Teaspoon --  Honey Cup --  Nectar Teaspoon --  Nectar Cup --  Nectar Straw --  Thin Teaspoon --  Thin Cup --  Thin Straw --  Puree --  Mechanical Soft --  Regular --  Multi-consistency --  Pill --  Cervical Esophageal Comment --     Kerrie Pleasure, MA, CCC-SLP Acute Rehabilitation Services Office: [phone]  06/15/2020, 1:35 PM

## 2020-06-15 NOTE — Progress Notes (Addendum)
Occupational Therapy Treatment Patient Details Name: Kathleen Cameron MRN: [id number] DOB: 10/25/1965 Today's Date: 06/15/2020    History of present illness 55 y.o. female admitted 2/13 from home with respiratory distress due to home vent malfunction after same day D/C from [facility] with home vent. PMHx pt with [facility] admission 11/6 with trach 03/02/20 ant transition to [facility] 11/18-2/12, 04/25/19 Gtube placed, COPD, hypertension, depression/anxiety, HLD, shingles infection,   OT comments  This 55 yo female admitted with above making progress with bed mobility (A for legs only today and increased time), HOB up and use of rail), able to sit EOB and do two grooming tasks, able to sit EOB and do Bil UE A/AAROM exercises for shoulders. Goals updated this session due to having met most of them.She will continue to benefit from acute OT with follow up at HHOT/24 hour care OR SNF/LTACH depending on family A.  Follow Up Recommendations  Home health OT;Supervision/Assistance - 24 hour (if family can manage at a hoyer lift level, if not then LTACH/SNF)    Equipment Recommendations  None recommended by OT       Precautions / Restrictions Precautions Precautions: Fall Precaution Comments: trach, vent Restrictions Weight Bearing Restrictions: No       Mobility Bed Mobility Overal bed mobility: Needs Assistance Bed Mobility: Supine to Sit;Sit to Supine     Supine to sit: Min assist;HOB elevated Sit to supine: Mod assist   General bed mobility comments: A for legs for supine>sit; A for legs sit<>supine       Balance Overall balance assessment: Needs assistance Sitting-balance support: Feet supported;Single extremity supported Sitting balance-Leahy Scale: Poor Sitting balance - Comments: EOB static sitting is fair , EOB dynamic sitting balance is poor with posterior and/or left lateral lean (but not total LOB, but did need VCs to correct)                                    ADL either performed or assessed with clinical judgement   ADL Overall ADL's : Needs assistance/impaired     Grooming: Wash/dry face;Oral care;Set up Grooming Details (indicate cue type and reason): with Mingaurd A for sitting EOB balance                                     Vision Baseline Vision/History: No visual deficits Patient Visual Report: No change from baseline            Cognition   Behavior During Therapy: WFL for tasks assessed/performed Overall Cognitive Status: Difficult to assess                                 General Comments: Pt interacting appropriately most of time; intermittently when I was working with her on A/AAROM of shoulders, I would demonstrate but she would not follow through on first asking her.        Exercises Other Exercises Other Exercises: While seated EOB had pt work on 10 reps each AROM for shoulder flexion, abduction, elevation, retraction, rolls. AROM shoulder flexion ~30 degrees Bil; AAROM shoulder flexion 90 degrees Bil. Pt also able to sit EOB and reach for grooming items with L and R UEs and place them on bed bedside her.  Pertinent Vitals/ Pain       Pain Assessment: No/denies pain         Frequency  Min 2X/week        Progress Toward Goals  OT Goals(current goals can now be found in the care plan section)  Progress towards OT goals: Progressing toward goals  Acute Rehab OT Goals Patient Stated Goal: agreeable to sit EOB to work on grooming and UE exercises OT Goal Formulation: With patient Time For Goal Achievement: 06/29/20 Potential to Achieve Goals: [place] Discharge plan remains appropriate       AM-PAC OT "6 Clicks" Daily Activity     Outcome Measure   Help from another person eating meals?: Total (NPO) Help from another person taking care of personal grooming?: A Little (for washing face and mouth care) Help from another person toileting, which includes using  toliet, bedpan, or urinal?: Total Help from another person bathing (including washing, rinsing, drying)?: A Lot Help from another person to put on and taking off regular upper body clothing?: Total Help from another person to put on and taking off regular lower body clothing?: Total 6 Click Score: 9    End of Session Equipment Utilized During Treatment:  (vent)  OT Visit Diagnosis: Muscle weakness (generalized) (M62.81);Other abnormalities of gait and mobility (R26.89)   Activity Tolerance Patient tolerated treatment well   Patient Left in bed;with call bell/phone within reach;with bed alarm set   Nurse Communication  (pt needs deep suction--he came in and did this before I left the room)        Time: 7416-3845 OT Time Calculation (min): 32 min  Charges: OT General Charges $OT Visit: 1 Visit OT Treatments $Self Care/Home Management : 8-22 mins $Therapeutic Exercise: 8-22 mins  Kathleen Cameron, OTR/L Acute [employer] Pager [phone] Office [phone]      Kathleen Cameron 06/15/2020, 9:45 AM

## 2020-06-15 NOTE — Progress Notes (Signed)
NAME:  Kathleen Cameron, MRN:  [id number], DOB:  1965/07/23, LOS: 12 ADMISSION DATE:  06/03/2020, CONSULTATION DATE:  2/12 REFERRING MD:  Pilar Plate, CHIEF COMPLAINT:  Dyspnea   Brief History:  55 year old female presented from home via EMS with complaints of acute respiratory distress after home ventilator failed.  [facility]  PCCM consulted for further management and admission. Diagnosed with HCAP and COPD exacerbation on admission.   Past Medical History:  Seizure disorder Hypertension Severe COPD Acute on chronic hypoxic and hypercapnic respiratory failure Anxiety  Significant Hospital Events:  Admitted 2/13  Consults:    Procedures:  Tracheostomy present prior to admission  Significant Diagnostic Tests:  2/23 Vas ultrasound upper extre> left subclavian DVT and superficial vein thrombosis  Micro Data:  2/13 sars cov 2/flu > negative 2/13 blood > staph hominis 1/4 2/13 resp > OPF 2/16 blood >   Antimicrobials:  Cefepime 2/13 > 2/19 Vanc 2/13 x1   Interim History / Subjective:  Feels OK Says she is hungry Loose stools yesterday   Objective   Blood pressure 116/79, pulse 97, temperature 98.3 F (36.8 C), temperature source Oral, resp. rate 16, height 5' 4.02" (1.626 m), weight 52.2 kg, SpO2 100 %.    Vent Mode: AC FiO2 (%):  [40 %] 40 % Set Rate:  [16 bmp] 16 bmp Vt Set:  [480 mL] 480 mL PEEP:  [5 cmH20] 5 cmH20   Intake/Output Summary (Last 24 hours) at 06/15/2020 0757 Last data filed at 06/14/2020 1945 Gross per 24 hour  Intake 877 ml  Output 160 ml  Net 717 ml   Filed Weights   06/13/20 0500 06/14/20 0551 06/15/20 0030  Weight: 51.4 kg 53.6 kg 52.2 kg    Examination: .General:  In bed on vent HENT: NCAT tracheostomy in place PULM: Improved air movement B, vent supported breathing CV: RRR, no mgr GI: BS+, soft, nontender, gastrostomy tube in place, mildly distended MSK: normal bulk and tone Neuro: awake,  alert    Resolved Hospital Problem list     Assessment & Plan:  Acute on chronic hypoxemic and hypercapneic resp failure due to COPD Chronic respiratory failure with hypercarbia on home mechanical ventilation Continue home vent on current settings VAP prevention Brovana/pulmicort to continue  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  Hypertension Monitor off medication  Hypothyroidism Synthroid to contiue  Acute metabolic encephalopathy Anxiety Clonazepam, temazepam, venlafaxine to continue  Best practice (evaluated daily)  Diet: tube feeding Pain/Anxiety/Delirium protocol (if indicated): n/a VAP protocol (if indicated): yes DVT prophylaxis: eliquis GI prophylaxis: n/a Glucose control: monitor  Mobility: PT/OT following Disposition:home when home health nurse available  Goals of Care:  Last date of multidisciplinary goals of care discussion 2/16 Family and staff present: Kendrick Fries, husband Earvin Hansen Summary of discussion: full code Follow up goals of care discussion due: n/a Code Status: full  Labs   CBC: Recent Labs  Lab 06/10/20 0211 06/11/20 0728 06/12/20 0814  WBC 12.3* 11.6* 8.8  NEUTROABS  --  9.7*  --   HGB 8.8* 8.0* 7.6*  HCT 29.9* 27.0* 24.4*  MCV 94.3 93.1 92.4  PLT 198 170 157    Basic Metabolic Panel: Recent Labs  Lab 06/10/20 0211 06/11/20 0728 06/12/20 0814 06/14/20 0114  NA 141 141 140 143  K 4.0 3.8 3.7 4.2  CL 94* 95* 96* 98  CO2 39* 35* 35* 37*  GLUCOSE 161* 89 92 124*  BUN 23* 23* 22* 23*  CREATININE 0.36* 0.34* 0.32* <0.30*  CALCIUM 9.1 9.3 9.1 9.1  MG 2.2 2.0  --   --    GFR: CrCl cannot be calculated (This lab value cannot be used to calculate CrCl because it is not a number: <0.30). Recent Labs  Lab 06/10/20 0211 06/11/20 0728 06/12/20 0814  WBC 12.3* 11.6* 8.8    Liver Function Tests: No results for input(s): AST, ALT, ALKPHOS, BILITOT, PROT, ALBUMIN in the last 168 hours. No results for input(s): LIPASE,  AMYLASE in the last 168 hours. No results for input(s): AMMONIA in the last 168 hours.  ABG    Component Value Date/Time   PHART 7.495 (H) 05/19/2020 1055   PCO2ART 47.1 05/19/2020 1055   PO2ART 155 (H) 05/19/2020 1055   HCO3 33.0 (H) 06/03/2020 0052   TCO2 35 (H) 06/03/2020 0052   O2SAT 99.0 06/03/2020 0052     Coagulation Profile: No results for input(s): INR, PROTIME in the last 168 hours.  Cardiac Enzymes: No results for input(s): CKTOTAL, CKMB, CKMBINDEX, TROPONINI in the last 168 hours.  HbA1C: Hgb A1c MFr Bld  Date/Time Value Ref Range Status  02/26/2020 05:18 PM 5.4 4.8 - 5.6 % Final    Comment:    (NOTE) Pre diabetes:          5.7%-6.4%  Diabetes:              >6.4%  Glycemic control for   <7.0% adults with diabetes   12/22/2018 04:37 AM 5.5 4.8 - 5.6 % Final    Comment:    (NOTE) Pre diabetes:          5.7%-6.4% Diabetes:              >6.4% Glycemic control for   <7.0% adults with diabetes     CBG: Recent Labs  Lab 06/14/20 1717 06/14/20 1931 06/14/20 2338 06/15/20 0322 06/15/20 0723  GLUCAP 78 110* 121* 131* 90     Critical care time: n/a      Heber Penitas, MD [place] [facility] Pager: [phone] Cell: [phone] If no response, call [phone]

## 2020-06-15 NOTE — Evaluation (Signed)
Passy-Muir Speaking Valve - Evaluation Patient Details  Name: Kathleen Cameron MRN: [id number] Date of Birth: 1965-12-23  Today's Date: 06/15/2020 Time: 1222-1300 SLP Time Calculation (min) (ACUTE ONLY): 38 min  Past Medical History:  Past Medical History:  Diagnosis Date  . Acute on chronic respiratory failure with hypoxia (HCC)   . Anxiety disorder   . Asthma   . COPD (chronic obstructive pulmonary disease) (HCC)   . COPD, severe (HCC)   . Hypertension   . Seizure disorder [facility])    Past Surgical History:  Past Surgical History:  Procedure Laterality Date  . IR FLUORO RM 30-60 MIN  03/23/2020  . LAPAROSCOPIC INSERTION GASTROSTOMY TUBE N/A 04/24/2020   Procedure: LAPAROSCOPIC GASTROSTOMY TUBE PLACEMENT;  Surgeon: Sheliah Hatch, De Blanch, MD;  Location: [facility] OR;  Service: General;  Laterality: N/A;  . WISDOM TOOTH EXTRACTION     HPI:  55 year old female presented from home via EMS with complaints of acute respiratory distress after home ventilator failed.  [facility]  PCCM consulted for further management and admission. Diagnosed with HCAP and COPD exacerbation on admission. Pt is vent and PEG dependent at baseline. CXR 2/24: "Improved left lung aeration with persistent patchy  atelectasis/consolidation at the base. Similar trace pleural  effusions"   Assessment / Plan / Recommendation Clinical Impression  Pt was seen with RT for inline PMV placement. Pt had not used inline PMV since trials were initially started in November 2021. Pt tolerated cuff deflation well with adequate drop in expiratory volumes noted to suggest adequate upper airway patency. It was decide to leave pt on her home vent, on which PEEP does not go below 3 cm H20, as her hospital provided vent did not go below 5 cm H20. PMV was placed in line for two intervals of approximately 5-10 minutes each. After the first, shorter trial, pt reported feeling like she wasn't breathing as well. PMV was  doffed but cuff was left inflated with subjective improvement noted by pt. Of note, these reported difficulties were noted after SLP had been providing cueing to coordinate breathing/phonation. When PMV was placed again during completion of FEES, the valve was placed for a slightly longer duration but without any emphasis on using it for speech, and pt subjectively felt better with it in place. Anticipate good prognosis for pt to use PMV for longer intervals, but for now, would start with letting her wear it for short intervals when supervision can be provided (RT able to deflate cuff while on vent for placement). Will continue to follow to facilitate tolerance and communication. SLP Visit Diagnosis: Aphonia (R49.1)    SLP Assessment  Patient needs continued Speech Lanaguage Pathology Services    Follow Up Recommendations  Home health SLP    Frequency and Duration min 2x/week  2 weeks    PMSV Trial PMSV was placed for: 5+ min Able to redirect subglottic air through upper airway: Yes Able to Attain Phonation: Yes Voice Quality: Breathy;Low vocal intensity Able to Expectorate Secretions: No attempts Level of Secretion Expectoration with PMSV: Not observed Breath Support for Phonation: Moderately decreased Intelligibility: Intelligibility reduced Phrase: 50-74% accurate Respirations During Trial:  (20s) SpO2 During Trial:  (low 90s) Pulse During Trial:  (WNL) Behavior: Alert;Controlled;Cooperative   Tracheostomy Tube       Vent Dependency  Vent Mode: AC (ACVC) Set Rate: 16 bmp PEEP: 5 cmH20 FiO2 (%):  (5L bleed in) Vt Set: 480 mL    Cuff Deflation Trial  GO Tolerated Cuff Deflation:  Yes Length of Time for Cuff Deflation Trial: 20+ min Behavior: Alert;Cooperative;Controlled         Mahala Menghini., M.A. CCC-SLP Acute Rehabilitation Services Pager [phone] Office [phone]  06/15/2020, 4:36 PM

## 2020-06-15 NOTE — Evaluation (Signed)
Clinical/Bedside Swallow Evaluation Patient Details  Name: Kathleen Cameron MRN: [id number] Date of Birth: 21-Apr-1966  Today's Date: 06/15/2020 Time: SLP Start Time (ACUTE ONLY): 1153 SLP Stop Time (ACUTE ONLY): 1202 SLP Time Calculation (min) (ACUTE ONLY): 9 min  Past Medical History:  Past Medical History:  Diagnosis Date  . Acute on chronic respiratory failure with hypoxia (HCC)   . Anxiety disorder   . Asthma   . COPD (chronic obstructive pulmonary disease) (HCC)   . COPD, severe (HCC)   . Hypertension   . Seizure disorder [facility])    Past Surgical History:  Past Surgical History:  Procedure Laterality Date  . IR FLUORO RM 30-60 MIN  03/23/2020  . LAPAROSCOPIC INSERTION GASTROSTOMY TUBE N/A 04/24/2020   Procedure: LAPAROSCOPIC GASTROSTOMY TUBE PLACEMENT;  Surgeon: Sheliah Hatch, De Blanch, MD;  Location: [facility] OR;  Service: General;  Laterality: N/A;  . WISDOM TOOTH EXTRACTION     HPI:  55 year old female presented from home via EMS with complaints of acute respiratory distress after home ventilator failed.  [facility]  PCCM consulted for further management and admission. Diagnosed with HCAP and COPD exacerbation on admission. Pt is vent and PEG dependent at baseline. CXR 2/24: "Improved left lung aeration with persistent patchy  atelectasis/consolidation at the base. Similar trace pleural  effusions"   Assessment / Plan / Recommendation Clinical Impression  Pt tolerated ice chips and thin liquid by spoon without any clinical s/s of aspiration. Swallow initiation was seemingly timely on palpation.  Pt has been using PEG for nutrition and states that she has not been taking pleasure POs, but that she is eager to try to eat and drink.  Given pt's vent reliance recommend instrumental swallow assessment prior to initiation of PO diet.  Pt is agreeable to FEES.   SLP Visit Diagnosis: Dysphagia, oropharyngeal phase (R13.12)    Aspiration Risk  Moderate  aspiration risk    Diet Recommendation NPO        Other  Recommendations     Follow up Recommendations   TBD     Frequency and Duration   TBD         Prognosis   Good     Swallow Study   General Date of Onset: 04/02/21 HPI: 55 year old female presented from home via EMS with complaints of acute respiratory distress after home ventilator failed.  [facility]  PCCM consulted for further management and admission. Diagnosed with HCAP and COPD exacerbation on admission. Pt is vent and PEG dependent at baseline. CXR 2/24: "Improved left lung aeration with persistent patchy  atelectasis/consolidation at the base. Similar trace pleural  effusions" Type of Study: Bedside Swallow Evaluation Previous Swallow Assessment: none Diet Prior to this Study: NPO Temperature Spikes Noted: No Respiratory Status: Ventilator History of Recent Intubation: No Behavior/Cognition: Alert;Cooperative;Pleasant mood Oral Cavity Assessment: Within Functional Limits Oral Care Completed by SLP: Recent completion by staff Oral Cavity - Dentition: Adequate natural dentition Patient Positioning: Upright in bed Baseline Vocal Quality: Aphonic Volitional Swallow: Able to elicit    Oral/Motor/Sensory Function     Ice Chips Ice chips: Within functional limits Presentation: Spoon   Thin Liquid Thin Liquid: Within functional limits Presentation: Spoon    Nectar Thick Nectar Thick Liquid: Not tested   Honey Thick Honey Thick Liquid: Not tested   Puree Puree: Not tested   Solid     Solid: Not tested      Kathleen Cameron 06/15/2020,12:10 PM

## 2020-06-15 NOTE — Progress Notes (Addendum)
Upon assessment, patient had abdomen pain and distention. Patient rates pain 10 out of 10. Patient stated the pain started after being given the feeding supplement and free water. Pain medication given per MAR.  After aspirating gastric residual, patient states she feels better. Will continue to monitor and hold 2200 medications per tube.

## 2020-06-16 DIAGNOSIS — J9622 Acute and chronic respiratory failure with hypercapnia: Secondary | ICD-10-CM | POA: Diagnosis not present

## 2020-06-16 DIAGNOSIS — J9621 Acute and chronic respiratory failure with hypoxia: Secondary | ICD-10-CM | POA: Diagnosis not present

## 2020-06-16 LAB — GLUCOSE, CAPILLARY
Glucose-Capillary: 91 mg/dL (ref 70–99)
Glucose-Capillary: 119 mg/dL — ABNORMAL HIGH (ref 70–99)
Glucose-Capillary: 96 mg/dL (ref 70–99)
Glucose-Capillary: 91 mg/dL (ref 70–99)
Glucose-Capillary: 131 mg/dL — ABNORMAL HIGH (ref 70–99)
Glucose-Capillary: 104 mg/dL — ABNORMAL HIGH (ref 70–99)

## 2020-06-16 MED ORDER — INFLUENZA VAC SPLIT QUAD 0.5 ML IM SUSY
0.5000 mL | PREFILLED_SYRINGE | INTRAMUSCULAR | Status: DC | PRN
  Filled 2020-06-16: qty 0.5

## 2020-06-16 MED ORDER — INFLUENZA VAC SPLIT QUAD 0.5 ML IM SUSY: 0.5000 mL | PREFILLED_SYRINGE | INTRAMUSCULAR | Status: DC

## 2020-06-16 NOTE — Progress Notes (Signed)
NAME:  Kathleen Cameron, MRN:  [id number], DOB:  02-19-1966, LOS: 13 ADMISSION DATE:  06/03/2020, CONSULTATION DATE:  2/12 REFERRING MD:  Pilar Plate, CHIEF COMPLAINT:  Dyspnea   Brief History:  55 year old female presented from home via EMS with complaints of acute respiratory distress after home ventilator failed.  [facility]  PCCM consulted for further management and admission. Diagnosed with HCAP and COPD exacerbation on admission.   Past Medical History:  Seizure disorder Hypertension Severe COPD Acute on chronic hypoxic and hypercapnic respiratory failure Anxiety  Significant Hospital Events:  Admitted 2/13  Consults:  FEES 2/25 > without PMSV no aspiration, rec regular texture diet with thin liquids  Procedures:  Tracheostomy present prior to admission  Significant Diagnostic Tests:  2/23 Vas ultrasound upper extre> left subclavian DVT and superficial vein thrombosis 2/25 SLP  Micro Data:  2/13 sars cov 2/flu > negative 2/13 blood > staph hominis 1/4 2/13 resp > OPF 2/16 blood >   Antimicrobials:  Cefepime 2/13 > 2/19 Vanc 2/13 x1   Interim History / Subjective:   Passed swallow eval No acute events  Objective   Blood pressure 117/85, pulse 99, temperature 97.8 F (36.6 C), temperature source Axillary, resp. rate 16, height 5' 4.02" (1.626 m), weight 52.3 kg, SpO2 100 %.    Vent Mode: AC FiO2 (%):  [40 %] 40 % Set Rate:  [16 bmp] 16 bmp Vt Set:  [480 mL] 480 mL PEEP:  [5 cmH20] 5 cmH20   Intake/Output Summary (Last 24 hours) at 06/16/2020 1113 Last data filed at 06/16/2020 0548 Gross per 24 hour  Intake 1791 ml  Output --  Net 1791 ml   Filed Weights   06/14/20 0551 06/15/20 0030 06/16/20 0500  Weight: 53.6 kg 52.2 kg 52.3 kg    Examination: General:  Resting comfortably in bed HENT: NCAT tracheostomy in place PULM: Rhonchi B, normal effort CV: RRR, no mgr GI: BS+, soft, nontender MSK: normal bulk and tone Neuro:  awake, alert, no distress, MAEW    Resolved Hospital Problem list     Assessment & Plan:  Acute on chronic hypoxemic and hypercapneic resp failure due to COPD Chronic respiratory failure with hypercarbia on home mechanical ventilation Full mechanical vent support on home ventilator VAP prevention Brovana/pulmicort to continue  Provoked left subclavian DVT Eliquis 3 months  Hypertension Monitor off medication  Hypothyroidism Synthroid to continue  Acute metabolic encephalopathy Anxiety Clonazepam, temazepam, venlafaxine to continue  Nutrition needs Continue tube feeding for nutrition support but able to eat for pleasure per SLP evaluation  Best practice (evaluated daily)  Diet: tube feeding, some oral Pain/Anxiety/Delirium protocol (if indicated): n/a VAP protocol (if indicated): yes DVT prophylaxis: eliquis GI prophylaxis: n/a Glucose control: monitor  Mobility: PT/OT following Disposition:home when home health nurse available  Goals of Care:  Last date of multidisciplinary goals of care discussion 2/16 Family and staff present: Kendrick Fries, husband Earvin Hansen Summary of discussion: full code Follow up goals of care discussion due: n/a Code Status: full  Labs   CBC: Recent Labs  Lab 06/10/20 0211 06/11/20 0728 06/12/20 0814  WBC 12.3* 11.6* 8.8  NEUTROABS  --  9.7*  --   HGB 8.8* 8.0* 7.6*  HCT 29.9* 27.0* 24.4*  MCV 94.3 93.1 92.4  PLT 198 170 157    Basic Metabolic Panel: Recent Labs  Lab 06/10/20 0211 06/11/20 0728 06/12/20 0814 06/14/20 0114  NA 141 141 140 143  K 4.0 3.8 3.7 4.2  CL 94* 95*  96* 98  CO2 39* 35* 35* 37*  GLUCOSE 161* 89 92 124*  BUN 23* 23* 22* 23*  CREATININE 0.36* 0.34* 0.32* <0.30*  CALCIUM 9.1 9.3 9.1 9.1  MG 2.2 2.0  --   --    GFR: CrCl cannot be calculated (This lab value cannot be used to calculate CrCl because it is not a number: <0.30). Recent Labs  Lab 06/10/20 0211 06/11/20 0728 06/12/20 0814  WBC 12.3*  11.6* 8.8    Liver Function Tests: No results for input(s): AST, ALT, ALKPHOS, BILITOT, PROT, ALBUMIN in the last 168 hours. No results for input(s): LIPASE, AMYLASE in the last 168 hours. No results for input(s): AMMONIA in the last 168 hours.  ABG    Component Value Date/Time   PHART 7.495 (H) 05/19/2020 1055   PCO2ART 47.1 05/19/2020 1055   PO2ART 155 (H) 05/19/2020 1055   HCO3 33.0 (H) 06/03/2020 0052   TCO2 35 (H) 06/03/2020 0052   O2SAT 99.0 06/03/2020 0052     Coagulation Profile: No results for input(s): INR, PROTIME in the last 168 hours.  Cardiac Enzymes: No results for input(s): CKTOTAL, CKMB, CKMBINDEX, TROPONINI in the last 168 hours.  HbA1C: Hgb A1c MFr Bld  Date/Time Value Ref Range Status  02/26/2020 05:18 PM 5.4 4.8 - 5.6 % Final    Comment:    (NOTE) Pre diabetes:          5.7%-6.4%  Diabetes:              >6.4%  Glycemic control for   <7.0% adults with diabetes   12/22/2018 04:37 AM 5.5 4.8 - 5.6 % Final    Comment:    (NOTE) Pre diabetes:          5.7%-6.4% Diabetes:              >6.4% Glycemic control for   <7.0% adults with diabetes     CBG: Recent Labs  Lab 06/15/20 1507 06/15/20 1939 06/15/20 2321 06/16/20 0319 06/16/20 0837  GLUCAP 102* 129* 92 91 119*     Critical care time: n/a      Heber Danvers, MD [facility] Pager: [phone] Cell: [phone] If no response, call [phone]

## 2020-06-16 NOTE — Plan of Care (Signed)
  Problem: Activity: Goal: Ability to tolerate increased activity will improve Outcome: Progressing   Problem: Respiratory: Goal: Ability to maintain a clear airway and adequate ventilation will improve Outcome: Progressing   Problem: Role Relationship: Goal: Method of communication will improve Outcome: Completed/Met   Problem: Education: Goal: Knowledge of General Education information will improve Description: Including pain rating scale, medication(s)/side effects and non-pharmacologic comfort measures Outcome: Progressing   Problem: Clinical Measurements: Goal: Will remain free from infection Outcome: Progressing Goal: Diagnostic test results will improve Outcome: Progressing Goal: Respiratory complications will improve Outcome: Progressing   Problem: Nutrition: Goal: Adequate nutrition will be maintained Outcome: Progressing   Problem: Coping: Goal: Level of anxiety will decrease Outcome: Progressing   Problem: Pain Managment: Goal: General experience of comfort will improve Outcome: Progressing   Problem: Safety: Goal: Ability to remain free from injury will improve Outcome: Progressing

## 2020-06-17 DIAGNOSIS — J9621 Acute and chronic respiratory failure with hypoxia: Secondary | ICD-10-CM | POA: Diagnosis not present

## 2020-06-17 DIAGNOSIS — J9622 Acute and chronic respiratory failure with hypercapnia: Secondary | ICD-10-CM | POA: Diagnosis not present

## 2020-06-17 LAB — COMPREHENSIVE METABOLIC PANEL
ALT: 46 U/L — ABNORMAL HIGH (ref 0–44)
AST: 40 U/L (ref 15–41)
Albumin: 2.4 g/dL — ABNORMAL LOW (ref 3.5–5.0)
Alkaline Phosphatase: 83 U/L (ref 38–126)
Anion gap: 8 (ref 5–15)
BUN: 21 mg/dL — ABNORMAL HIGH (ref 6–20)
CO2: 34 mmol/L — ABNORMAL HIGH (ref 22–32)
Calcium: 9.2 mg/dL (ref 8.9–10.3)
Chloride: 100 mmol/L (ref 98–111)
Creatinine, Ser: 0.34 mg/dL — ABNORMAL LOW (ref 0.44–1.00)
GFR, Estimated: >60 mL/min (ref >60)
Glucose, Bld: 111 mg/dL — ABNORMAL HIGH (ref 70–99)
Potassium: 4.2 mmol/L (ref 3.5–5.1)
Sodium: 142 mmol/L (ref 135–145)
Total Bilirubin: 0.5 mg/dL (ref 0.3–1.2)
Total Protein: 5.8 g/dL — ABNORMAL LOW (ref 6.5–8.1)

## 2020-06-17 LAB — CBC
HCT: 23.5 % — ABNORMAL LOW (ref 36.0–46.0)
Hemoglobin: 7.1 g/dL — ABNORMAL LOW (ref 12.0–15.0)
MCH: 28.5 pg (ref 26.0–34.0)
MCHC: 30.2 g/dL (ref 30.0–36.0)
MCV: 94.4 fL (ref 80.0–100.0)
Platelets: 244 10*3/uL (ref 150–400)
RBC: 2.49 MIL/uL — ABNORMAL LOW (ref 3.87–5.11)
RDW: 19.2 % — ABNORMAL HIGH (ref 11.5–15.5)
WBC: 7 10*3/uL (ref 4.0–10.5)
nRBC: 0.4 % — ABNORMAL HIGH (ref 0.0–0.2)

## 2020-06-17 LAB — GLUCOSE, CAPILLARY
Glucose-Capillary: 100 mg/dL — ABNORMAL HIGH (ref 70–99)
Glucose-Capillary: 74 mg/dL (ref 70–99)
Glucose-Capillary: 81 mg/dL (ref 70–99)
Glucose-Capillary: 83 mg/dL (ref 70–99)
Glucose-Capillary: 123 mg/dL — ABNORMAL HIGH (ref 70–99)
Glucose-Capillary: 122 mg/dL — ABNORMAL HIGH (ref 70–99)

## 2020-06-17 LAB — TYPE AND SCREEN
ABO/RH(D): O POS
Antibody Screen: NEGATIVE

## 2020-06-17 NOTE — Progress Notes (Addendum)
NAME:  Kathleen Cameron, MRN:  [id number], DOB:  1965/06/18, LOS: 14 ADMISSION DATE:  06/03/2020, CONSULTATION DATE:  2/12 REFERRING MD:  Pilar Plate, CHIEF COMPLAINT:  Dyspnea   Brief History:  55 year old female presented from home via EMS with complaints of acute respiratory distress after home ventilator failed.  [facility]  PCCM consulted for further management and admission. Diagnosed with HCAP and COPD exacerbation on admission.   Past Medical History:  Seizure disorder Hypertension Severe COPD Acute on chronic hypoxic and hypercapnic respiratory failure Anxiety  Significant Hospital Events:  Admitted 2/13  Consults:  FEES 2/25 > without PMSV no aspiration, rec regular texture diet with thin liquids  Procedures:  Tracheostomy present prior to admission  Significant Diagnostic Tests:  2/23 Vas ultrasound upper extre> left subclavian DVT and superficial vein thrombosis 2/25 SLP  Micro Data:  2/13 sars cov 2/flu > negative 2/13 blood > staph hominis 1/4 2/13 resp > OPF 2/16 blood >   Antimicrobials:  Cefepime 2/13 > 2/19 Vanc 2/13 x1   Interim History / Subjective:   Feels great hgb slowly dropping  Objective   Blood pressure 100/80, pulse 92, temperature 98.6 F (37 C), temperature source Oral, resp. rate 16, height 5' 4.02" (1.626 m), weight 53.8 kg, SpO2 (!) 88 %.    Vent Mode: AC FiO2 (%):  [40 %] 40 % Set Rate:  [16 bmp] 16 bmp Vt Set:  [480 mL] 480 mL PEEP:  [5 cmH20] 5 cmH20 Plateau Pressure:  [21 cmH20] 21 cmH20   Intake/Output Summary (Last 24 hours) at 06/17/2020 0750 Last data filed at 06/17/2020 9767 Gross per 24 hour  Intake 1858 ml  Output 200 ml  Net 1658 ml   Filed Weights   06/15/20 0030 06/16/20 0500 06/17/20 0223  Weight: 52.2 kg 52.3 kg 53.8 kg    Examination:  General:  Resting comfortably in bed on vent HENT: NCAT OP clear traheostomy in place PULM: Poor air movement B, vent supported  breathing CV: RRR, no mgr GI: BS+, soft, nontender MSK: normal bulk and tone Neuro: awake, alert, no distress, MAEW   Resolved Hospital Problem list     Assessment & Plan:  Acute on chronic hypoxemic and hypercapneic resp failure due to COPD Chronic respiratory failure with hypercarbia on home mechanical ventilation Full mechanical vent support VAP prevention Brovana/pulmicort/yupelri to continue  Provoked left subclavian DVT Eliquis 3 months  Anemia, normocytic without evidence of bleeding Monitor for bleeding Transfuse PRBC for Hgb < 7 gm/dL  Hypertension Monitor off medication  Hypothyroidism Synthroid to continue  Acute metabolic encephalopathy Anxiety Clonazepam, temazepam, venlafaxine to continue  Nutrition needs Continue tube feeding for nutrition support but able to eat for pleasure per SLP evaluation  Best practice (evaluated daily)  Diet: tube feeding, some oral Pain/Anxiety/Delirium protocol (if indicated): n/a VAP protocol (if indicated): yes DVT prophylaxis: eliquis GI prophylaxis: n/a Glucose control: monitor  Mobility: PT/OT following Disposition:home when home health nurse available  Goals of Care:  Last date of multidisciplinary goals of care discussion 2/16 Family and staff present: Kendrick Fries, husband Earvin Hansen Summary of discussion: full code Follow up goals of care discussion due: n/a Code Status: full  Her husband Earvin Hansen was updated at bedside on 2/27  Labs   CBC: Recent Labs  Lab 06/11/20 0728 06/12/20 0814 06/17/20 0114  WBC 11.6* 8.8 7.0  NEUTROABS 9.7*  --   --   HGB 8.0* 7.6* 7.1*  HCT 27.0* 24.4* 23.5*  MCV 93.1 92.4 94.4  PLT 170 157 244    Basic Metabolic Panel: Recent Labs  Lab 06/11/20 0728 06/12/20 0814 06/14/20 0114 06/17/20 0114  NA 141 140 143 142  K 3.8 3.7 4.2 4.2  CL 95* 96* 98 100  CO2 35* 35* 37* 34*  GLUCOSE 89 92 124* 111*  BUN 23* 22* 23* 21*  CREATININE 0.34* 0.32* <0.30* 0.34*  CALCIUM 9.3 9.1  9.1 9.2  MG 2.0  --   --   --    GFR: Estimated Creatinine Clearance: 66.7 mL/min (A) (by C-G formula based on SCr of 0.34 mg/dL (L)). Recent Labs  Lab 06/11/20 0728 06/12/20 0814 06/17/20 0114  WBC 11.6* 8.8 7.0    Liver Function Tests: Recent Labs  Lab 06/17/20 0114  AST 40  ALT 46*  ALKPHOS 83  BILITOT 0.5  PROT 5.8*  ALBUMIN 2.4*   No results for input(s): LIPASE, AMYLASE in the last 168 hours. No results for input(s): AMMONIA in the last 168 hours.  ABG    Component Value Date/Time   PHART 7.495 (H) 05/19/2020 1055   PCO2ART 47.1 05/19/2020 1055   PO2ART 155 (H) 05/19/2020 1055   HCO3 33.0 (H) 06/03/2020 0052   TCO2 35 (H) 06/03/2020 0052   O2SAT 99.0 06/03/2020 0052     Coagulation Profile: No results for input(s): INR, PROTIME in the last 168 hours.  Cardiac Enzymes: No results for input(s): CKTOTAL, CKMB, CKMBINDEX, TROPONINI in the last 168 hours.  HbA1C: Hgb A1c MFr Bld  Date/Time Value Ref Range Status  02/26/2020 05:18 PM 5.4 4.8 - 5.6 % Final    Comment:    (NOTE) Pre diabetes:          5.7%-6.4%  Diabetes:              >6.4%  Glycemic control for   <7.0% adults with diabetes   12/22/2018 04:37 AM 5.5 4.8 - 5.6 % Final    Comment:    (NOTE) Pre diabetes:          5.7%-6.4% Diabetes:              >6.4% Glycemic control for   <7.0% adults with diabetes     CBG: Recent Labs  Lab 06/16/20 1149 06/16/20 1531 06/16/20 2001 06/16/20 2321 06/17/20 0315  GLUCAP 96 91 131* 104* 100*     Critical care time: n/a      Heber Coats, MD  PCCM Pager: [phone] Cell: [phone] If no response, call [phone]

## 2020-06-18 ENCOUNTER — Inpatient Hospital Stay (HOSPITAL_COMMUNITY): Payer: No Typology Code available for payment source

## 2020-06-18 DIAGNOSIS — J9601 Acute respiratory failure with hypoxia: Secondary | ICD-10-CM

## 2020-06-18 DIAGNOSIS — J9621 Acute and chronic respiratory failure with hypoxia: Secondary | ICD-10-CM | POA: Diagnosis not present

## 2020-06-18 DIAGNOSIS — J9622 Acute and chronic respiratory failure with hypercapnia: Secondary | ICD-10-CM | POA: Diagnosis not present

## 2020-06-18 LAB — CBC
HCT: 23.6 % — ABNORMAL LOW (ref 36.0–46.0)
Hemoglobin: 7.1 g/dL — ABNORMAL LOW (ref 12.0–15.0)
MCH: 28.1 pg (ref 26.0–34.0)
MCHC: 30.1 g/dL (ref 30.0–36.0)
MCV: 93.3 fL (ref 80.0–100.0)
Platelets: 264 10*3/uL (ref 150–400)
RBC: 2.53 MIL/uL — ABNORMAL LOW (ref 3.87–5.11)
RDW: 19.1 % — ABNORMAL HIGH (ref 11.5–15.5)
WBC: 7.5 10*3/uL (ref 4.0–10.5)
nRBC: 0 % (ref 0.0–0.2)

## 2020-06-18 LAB — GLUCOSE, CAPILLARY
Glucose-Capillary: 108 mg/dL — ABNORMAL HIGH (ref 70–99)
Glucose-Capillary: 66 mg/dL — ABNORMAL LOW (ref 70–99)
Glucose-Capillary: 130 mg/dL — ABNORMAL HIGH (ref 70–99)
Glucose-Capillary: 163 mg/dL — ABNORMAL HIGH (ref 70–99)
Glucose-Capillary: 153 mg/dL — ABNORMAL HIGH (ref 70–99)
Glucose-Capillary: 116 mg/dL — ABNORMAL HIGH (ref 70–99)

## 2020-06-18 LAB — CK: Total CK: 30 U/L — ABNORMAL LOW (ref 38–234)

## 2020-06-18 MED ORDER — ENSURE ENLIVE PO LIQD
237.0000 mL | Freq: Three times a day (TID) | ORAL | Status: DC
  Administered 2020-06-18 – 2020-07-02 (×35): 237 mL via ORAL
  Filled 2020-06-18 (×3): qty 237

## 2020-06-18 MED ORDER — GABAPENTIN 600 MG PO TABS: 300.0000 mg | ORAL_TABLET | Freq: Two times a day (BID) | ORAL | Status: DC

## 2020-06-18 MED ORDER — PROSOURCE TF PO LIQD
45.0000 mL | Freq: Two times a day (BID) | ORAL | Status: DC
  Administered 2020-06-18 – 2020-07-02 (×26): 45 mL
  Filled 2020-06-18 (×26): qty 45

## 2020-06-18 MED ORDER — FREE WATER
160.0000 mL | Freq: Three times a day (TID) | Status: DC
  Administered 2020-06-18 – 2020-06-27 (×24): 160 mL

## 2020-06-18 MED ORDER — OSMOLITE 1.5 CAL PO LIQD
237.0000 mL | Freq: Three times a day (TID) | ORAL | Status: DC
  Administered 2020-06-18 – 2020-06-20 (×7): 237 mL
  Filled 2020-06-18 (×8): qty 237

## 2020-06-18 MED ORDER — GABAPENTIN 300 MG PO CAPS
300.0000 mg | ORAL_CAPSULE | Freq: Two times a day (BID) | ORAL | Status: DC
  Administered 2020-06-18 – 2020-06-20 (×6): 300 mg
  Filled 2020-06-18 (×6): qty 1

## 2020-06-18 NOTE — Progress Notes (Signed)
Nutrition Follow-up  DOCUMENTATION CODES:   Non-severe (moderate) malnutrition in context of chronic illness  INTERVENTION:   - 48-hour calorie count ordered  - Ensure Enlive po TID, each supplement provides 350 kcal and 20 grams of protein  Continue tube feeding via PEG: - Bolus feedings of Osmolite 1.5 cal 237 ml (1 carton) 3 times per day between meals (total of 3 cartons/711 ml per day) - Prosource TF 45 ml BID  Bolus tube feeding regimen provides 1147 kcal, 67 grams of protein, and 583 ml free water daily (meets 82% of kcal needs and 84% of protein needs).  Free water flushes of 80 ml before and after each bolus feeding for an additional 480 ml free water daily (1063 ml total).  - Continue MVI with minerals daily per tube  NUTRITION DIAGNOSIS:   Moderate Malnutrition related to chronic illness (COPD) as evidenced by mild muscle depletion,mild fat depletion.  Ongoing, being addressed via TF and supplements  GOAL:   Patient will meet greater than or equal to 90% of their needs  Progressing  MONITOR:   PO intake,Supplement acceptance,Labs,Weight trends,TF tolerance,Skin  REASON FOR ASSESSMENT:   Consult Enteral/tube feeding initiation and management,Calorie Count  ASSESSMENT:   55 yo female admitted from home with ventilator malfunction. She was [facility] from [facility] and required re-admission to [facility] on the same day. PMH includes HTN, asthma, COPD, seizure disorder, anxiety disorder.   2/25 - s/p FEES, diet advanced to regular with thin liquids  Discussed pt with RN and during ICU rounds. Consult received to initiate calorie count in an attempt to wean tube feeds. Per notes, diet advanced for pleasure. Discussed pt with NP who would like to continue with plan to start calorie count. RD will order oral nutrition supplements to aid pt in meeting kcal and protein needs via PO route.  RN reports that pt consumed 75% of 1 box of cereal and 50% of milk  carton for breakfast this morning. This provides 127 kcal and 5 grams of protein. RD to place calorie count envelope on pt's door.  Admit weight: 56.5 kg Current weight: 55.6 kg  Patient remains on ventilator support (home vent) via trach. MV: 7.5 L/min Temp (24hrs), Avg:98.3 F (36.8 C), Min:97.8 F (36.6 C), Max:98.9 F (37.2 C)  Meal Completion: 50% x 3 meals  Medications reviewed and include: MVI with minerals daily, protonix  Labs reviewed: BUN 21, hemoglobin 7.1 CBG's: 66-123 x 24 hours  UOP: 1450 ml x 24 hours  Diet Order:   Diet Order            Diet regular Room service appropriate? Yes; Fluid consistency: Thin  Diet effective now                 EDUCATION NEEDS:   Not appropriate for education at this time  Skin:  Skin Assessment: Skin Integrity Issues: Stage II: R buttocks, R ankle  Last BM:  06/17/20  Height:   Ht Readings from Last 1 Encounters:  06/13/20 5' 4.02" (1.626 m)    Weight:   Wt Readings from Last 1 Encounters:  06/18/20 55.6 kg    BMI:  Body mass index is 21.03 kg/m.  Estimated Nutritional Needs:   Kcal:  1400-1600  Protein:  80-95 gm  Fluid:  >/= 1.5 L    Mertie Clause, MS, RD, LDN Inpatient Clinical Dietitian Please see AMiON for contact information.

## 2020-06-18 NOTE — Progress Notes (Signed)
Notify NP, Renae Fickle of new findings of left lower arm erythema,warm and firm to touch, with pulse and cap refill present.

## 2020-06-18 NOTE — Progress Notes (Signed)
Pt in extreme foot pain. Recommended ultrasound order to day shift.

## 2020-06-18 NOTE — Progress Notes (Addendum)
NAME:  Kathleen Cameron, MRN:  [id number], DOB:  09-18-1965, LOS: 15 ADMISSION DATE:  06/03/2020, CONSULTATION DATE:  2/12 REFERRING MD:  Pilar Plate, CHIEF COMPLAINT:  Dyspnea   Brief History:  55 year old female presented from home via EMS with complaints of acute respiratory distress after home ventilator failed.  [facility]  PCCM consulted for further management and admission. Diagnosed with HCAP and COPD exacerbation on admission.   Past Medical History:  Seizure disorder Hypertension Severe COPD Acute on chronic hypoxic and hypercapnic respiratory failure Anxiety  Significant Hospital Events:  Admitted 2/13  Consults:    Procedures:  Tracheostomy present prior to admission  Significant Diagnostic Tests:  2/23 Vas ultrasound upper extre> left subclavian DVT and superficial vein thrombosis FEES 2/25 > without PMSV no aspiration, rec regular texture diet with thin liquids  Micro Data:  2/13 sars cov 2/flu > negative 2/13 blood > staph hominis 1/4 2/13 resp > OPF 2/16 blood > neg  Antimicrobials:  Cefepime 2/13 > 2/19 Vanc 2/13 x1   Interim History / Subjective:   R lower extremity pain today from knee to foot.   Objective   Blood pressure (!) 128/92, pulse 97, temperature 97.8 F (36.6 C), temperature source Oral, resp. rate 18, height 5' 4.02" (1.626 m), weight 55.6 kg, SpO2 97 %.    Vent Mode: AC FiO2 (%):  [40 %] 40 % Set Rate:  [16 bmp] 16 bmp Vt Set:  [480 mL] 480 mL PEEP:  [5 cmH20] 5 cmH20 Plateau Pressure:  [14 cmH20-22 cmH20] 14 cmH20   Intake/Output Summary (Last 24 hours) at 06/18/2020 0819 Last data filed at 06/18/2020 0600 Gross per 24 hour  Intake 780 ml  Output 1450 ml  Net -670 ml   Filed Weights   06/16/20 0500 06/17/20 0223 06/18/20 0500  Weight: 52.3 kg 53.8 kg 55.6 kg    Examination: General:  In bed eating breakfast, appears comfortable HENT: NCAT, Trach in place PULM: Vent supported breaths,  diminished bases.  CV: RRR, no MRG GI: Soft, non-tender, normoactive MSK: NCAT. RLE is warm, 1+ pulses, no significant edema or erythema. Tender to palpation.  Neuro: awake, alert, no distress, MAEW   Resolved Hospital Problem list     Assessment & Plan:  Acute on chronic hypoxemic and hypercapneic resp failure due to COPD Chronic respiratory failure with hypercarbia on home mechanical ventilation - Full MV support - VAP bundle - Brovana/pulmicort/yupelri to continue  Provoked left subclavian DVT - Eliquis 3 months  Anemia, normocytic without evidence of bleeding - Monitor for signs of bleeding - Transfuse for hemoglobin less than 7.   Hypertension - Has been acceptable off antihypertensives, monitor.   Hypothyroidism - Synthroid  Acute metabolic encephalopathy Anxiety - Clonazepam, temazepam, venlafaxine to continue  Right lower extremity pain - not relieved by fentanyl/gabapentin - X-Rays this morning negative - No edema or erythema. Pulses intact.  - Will discuss further workup with MD.   Nutrition needs - Continue TF for now, but is taking a diet after cleared by SLP - Will ask dietary to assist with calorie count in an attempt to wean/DC tube feeds. .   Best practice (evaluated daily)  Diet: tube feeding, some oral Pain/Anxiety/Delirium protocol (if indicated): n/a VAP protocol (if indicated): yes DVT prophylaxis: eliquis GI prophylaxis: n/a Glucose control: monitor  Mobility: PT/OT following Disposition:home when home health nurse available  Goals of Care:  Last date of multidisciplinary goals of care discussion 2/16 Family and staff present: [place],  husband Earvin Hansen Summary of discussion: full code Follow up goals of care discussion due: n/a Code Status: full   Labs   CBC: Recent Labs  Lab 06/12/20 0814 06/17/20 0114 06/18/20 0120  WBC 8.8 7.0 7.5  HGB 7.6* 7.1* 7.1*  HCT 24.4* 23.5* 23.6*  MCV 92.4 94.4 93.3  PLT 157 244 264    Basic  Metabolic Panel: Recent Labs  Lab 06/12/20 0814 06/14/20 0114 06/17/20 0114  NA 140 143 142  K 3.7 4.2 4.2  CL 96* 98 100  CO2 35* 37* 34*  GLUCOSE 92 124* 111*  BUN 22* 23* 21*  CREATININE 0.32* <0.30* 0.34*  CALCIUM 9.1 9.1 9.2   GFR: Estimated Creatinine Clearance: 67.8 mL/min (A) (by C-G formula based on SCr of 0.34 mg/dL (L)). Recent Labs  Lab 06/12/20 0814 06/17/20 0114 06/18/20 0120  WBC 8.8 7.0 7.5    Liver Function Tests: Recent Labs  Lab 06/17/20 0114  AST 40  ALT 46*  ALKPHOS 83  BILITOT 0.5  PROT 5.8*  ALBUMIN 2.4*   No results for input(s): LIPASE, AMYLASE in the last 168 hours. No results for input(s): AMMONIA in the last 168 hours.  ABG    Component Value Date/Time   PHART 7.495 (H) 05/19/2020 1055   PCO2ART 47.1 05/19/2020 1055   PO2ART 155 (H) 05/19/2020 1055   HCO3 33.0 (H) 06/03/2020 0052   TCO2 35 (H) 06/03/2020 0052   O2SAT 99.0 06/03/2020 0052     Coagulation Profile: No results for input(s): INR, PROTIME in the last 168 hours.  Cardiac Enzymes: No results for input(s): CKTOTAL, CKMB, CKMBINDEX, TROPONINI in the last 168 hours.  HbA1C: Hgb A1c MFr Bld  Date/Time Value Ref Range Status  02/26/2020 05:18 PM 5.4 4.8 - 5.6 % Final    Comment:    (NOTE) Pre diabetes:          5.7%-6.4%  Diabetes:              >6.4%  Glycemic control for   <7.0% adults with diabetes   12/22/2018 04:37 AM 5.5 4.8 - 5.6 % Final    Comment:    (NOTE) Pre diabetes:          5.7%-6.4% Diabetes:              >6.4% Glycemic control for   <7.0% adults with diabetes     CBG: Recent Labs  Lab 06/17/20 1603 06/17/20 1937 06/17/20 2320 06/18/20 0322 06/18/20 0719  GLUCAP 83 123* 122* 108* 66*     Critical care time: n/a      Joneen Roach, AGACNP-BC [facility]  See Amion for personal pager PCCM on call pager [phone] until 7pm. Please call Elink 7p-7a. [phone]  06/18/2020 8:29 AM

## 2020-06-18 NOTE — Progress Notes (Signed)
eLink Physician-Brief Progress Note Patient Name: Kathleen Cameron DOB: 1965-10-04 MRN: [id number]   Date of Service  06/18/2020  HPI/Events of Note  Patient c/o foot pain. No improvement with Fentanyl IV.  eICU Interventions  Plan: 1. Neurontin 300 mg per tube now and BID.     Intervention Category Major Interventions: Other:  Lenell Antu 06/18/2020, 6:52 AM

## 2020-06-19 DIAGNOSIS — J9622 Acute and chronic respiratory failure with hypercapnia: Secondary | ICD-10-CM | POA: Diagnosis not present

## 2020-06-19 DIAGNOSIS — J9621 Acute and chronic respiratory failure with hypoxia: Secondary | ICD-10-CM | POA: Diagnosis not present

## 2020-06-19 DIAGNOSIS — F419 Anxiety disorder, unspecified: Secondary | ICD-10-CM | POA: Diagnosis not present

## 2020-06-19 DIAGNOSIS — J449 Chronic obstructive pulmonary disease, unspecified: Secondary | ICD-10-CM | POA: Diagnosis not present

## 2020-06-19 LAB — CBC
HCT: 24.8 % — ABNORMAL LOW (ref 36.0–46.0)
Hemoglobin: 7.2 g/dL — ABNORMAL LOW (ref 12.0–15.0)
MCH: 27.7 pg (ref 26.0–34.0)
MCHC: 29 g/dL — ABNORMAL LOW (ref 30.0–36.0)
MCV: 95.4 fL (ref 80.0–100.0)
Platelets: 279 10*3/uL (ref 150–400)
RBC: 2.6 MIL/uL — ABNORMAL LOW (ref 3.87–5.11)
RDW: 18.9 % — ABNORMAL HIGH (ref 11.5–15.5)
WBC: 8 10*3/uL (ref 4.0–10.5)
nRBC: 0.2 % (ref 0.0–0.2)

## 2020-06-19 LAB — GLUCOSE, CAPILLARY
Glucose-Capillary: 139 mg/dL — ABNORMAL HIGH (ref 70–99)
Glucose-Capillary: 113 mg/dL — ABNORMAL HIGH (ref 70–99)
Glucose-Capillary: 123 mg/dL — ABNORMAL HIGH (ref 70–99)
Glucose-Capillary: 118 mg/dL — ABNORMAL HIGH (ref 70–99)
Glucose-Capillary: 138 mg/dL — ABNORMAL HIGH (ref 70–99)

## 2020-06-19 LAB — BASIC METABOLIC PANEL
Anion gap: 11 (ref 5–15)
BUN: 16 mg/dL (ref 6–20)
CO2: 32 mmol/L (ref 22–32)
Calcium: 9.1 mg/dL (ref 8.9–10.3)
Chloride: 101 mmol/L (ref 98–111)
Creatinine, Ser: 0.35 mg/dL — ABNORMAL LOW (ref 0.44–1.00)
GFR, Estimated: >60 mL/min (ref >60)
Glucose, Bld: 160 mg/dL — ABNORMAL HIGH (ref 70–99)
Potassium: 4.1 mmol/L (ref 3.5–5.1)
Sodium: 144 mmol/L (ref 135–145)

## 2020-06-19 MED ORDER — OXYCODONE-ACETAMINOPHEN 5-325 MG PO TABS
1.0000 | ORAL_TABLET | ORAL | Status: DC | PRN
  Administered 2020-06-20 – 2020-07-01 (×5): 1
  Filled 2020-06-19 (×5): qty 1

## 2020-06-19 MED ORDER — DOXYCYCLINE HYCLATE 100 MG PO TABS
100.0000 mg | ORAL_TABLET | Freq: Two times a day (BID) | ORAL | Status: DC
  Administered 2020-06-19 – 2020-06-20 (×4): 100 mg
  Filled 2020-06-19 (×5): qty 1

## 2020-06-19 NOTE — Progress Notes (Signed)
Calorie Count Note  48 hour calorie count ordered; will finish today after dinner.  Diet: Regular Supplements: Ensure Enlive PO TID Bolus TF: Osmolite 1.5 237 ml (1 carton ) TID between meals with Prosource TF 45 ml BID via PEG  Breakfast: 210 kcal, 6 gm protein Lunch: 350 kcal, 15 gm protein Dinner: 205 kcal, 7 gm protein Supplements (drank 2 Ensure Enlive supplements): 700 kcal, 40 gm protein  Total intake: 1465 kcal (100% of minimum estimated needs)  68 gm protein (85% of minimum estimated needs)  Bolus TF is providing 1147 kcal, 67 gm protein, 583 ml free water daily.  Nutrition Dx: Moderate Malnutrition related to chronic illness (COPD) as evidenced by mild muscle depletion,mild fat depletion; ongoing, being addressed via TF and supplements  Goal: Patient will meet greater than or equal to 90% of their needs; progressing  Intervention:   Continue calorie count through the end of today.  Continue bolus TF via PEG:  Osmolite 1.5 237 ml (1 carton ) TID between meals with Prosource TF 45 ml BID; can likely discontinue or at least reduce to one carton daily tomorrow.  Continue Ensure Enlive po TID, each supplement provides 350 kcal and 20 grams of protein, encourage intake as this is providing more than half of kcal and protein needs.   Gabriel Rainwater, RD, LDN, CNSC Please refer to [facility] for contact information.

## 2020-06-19 NOTE — Progress Notes (Signed)
eLink Physician-Brief Progress Note Patient Name: Kathleen Cameron DOB: 14-Dec-1965 MRN: [id number]   Date of Service  06/19/2020  HPI/Events of Note  Request for AM labs  eICU Interventions  Reasonable to monitor CBC as patient anemic. Will include BMP     Intervention Category Minor Interventions: Other:  Darl Pikes 06/19/2020, 1:50 AM

## 2020-06-19 NOTE — Progress Notes (Signed)
NAME:  Kathleen Cameron, MRN:  [id number], DOB:  [birth year], LOS: 16 ADMISSION DATE:  06/03/2020, CONSULTATION DATE:  2/12 REFERRING MD:  Pilar Plate, CHIEF COMPLAINT:  Dyspnea   Brief History:  55 year old female presented from home via EMS with complaints of acute respiratory distress after home ventilator failed.  [facility]  PCCM consulted for further management and admission. Diagnosed with HCAP and COPD exacerbation on admission.   Past Medical History:  Seizure disorder Hypertension Severe COPD Acute on chronic hypoxic and hypercapnic respiratory failure Anxiety  Significant Hospital Events:  Admitted 2/13  Consults:    Procedures:  Tracheostomy present prior to admission  Significant Diagnostic Tests:  2/23 Vas ultrasound upper extre> left subclavian DVT and superficial vein thrombosis FEES 2/25 > without PMSV no aspiration, rec regular texture diet with thin liquids  Micro Data:  2/13 sars cov 2/flu > negative 2/13 blood > staph hominis 1/4 2/13 resp > OPF 2/16 blood > neg  Antimicrobials:  Cefepime 2/13 > 2/19 Vanc 2/13 x1   Interim History / Subjective:   No acute events overnight  Still complaining of R foot pain RN tells me of left upper extremity edema   Objective   Blood pressure 121/85, pulse (!) 111, temperature 99.7 F (37.6 C), temperature source Oral, resp. rate 18, height 5' 4.02" (1.626 m), weight 55.2 kg, SpO2 100 %.    Vent Mode: AC FiO2 (%):  [40 %] 40 % Set Rate:  [16 bmp] 16 bmp Vt Set:  [480 mL] 480 mL PEEP:  [5 cmH20] 5 cmH20 Plateau Pressure:  [15 cmH20-19 cmH20] 16 cmH20   Intake/Output Summary (Last 24 hours) at 06/19/2020 0730 Last data filed at 06/19/2020 0554 Gross per 24 hour  Intake 360 ml  Output 1676 ml  Net -1316 ml   Filed Weights   06/17/20 0223 06/18/20 0500 06/19/20 0500  Weight: 53.8 kg 55.6 kg 55.2 kg    Examination:  General: adult female eating breakfast in bed on vent.  HENT:  /AT, Trach in place. No JVD PULM: Vent supported breaths, diminished bases.  CV: Tachy, regular, no MRG GI: Soft, non-tender BS+ MSK:  R foot remains tender, exam otherwise insignificant. Left upper extremity with erythema, pitting edema.  Neuro: awake, alert, no distress   Resolved Hospital Problem list     Assessment & Plan:  Acute on chronic hypoxemic and hypercapneic resp failure due to COPD Chronic respiratory failure with hypercarbia on home mechanical ventilation - Full vent support - Brovana/pulmicort/yupelri to continue  Provoked left subclavian DVT - 3 months eliquis  Anemia, normocytic without evidence of bleeding: hgb remains stable in the 7-8 range - Monitor for signs of bleeding - Transfuse for hemoglobin less than 7   Hypertension - monitor   Hypothyroidism - synthroid  Acute metabolic encephalopathy Anxiety - Clonazepam, temazepam, venlafaxine to continue  Right lower extremity pain - No edema or erythema. Pulses intact. X-rays negative  - PRN oxycodone  Left upper extremity edema: doppler study 2/22 with L subclavian DVT. Edema does seem a bit worse today. Low grade fever. - Warm compress.  - Seems more likely to be related to DVT, but low threshold to start antibiotics with WBC jump or worsening fevers.   Nutrition needs - 48 hour calorie count ongoing.   Best practice (evaluated daily)  Diet: tube feeding, some oral Pain/Anxiety/Delirium protocol (if indicated): n/a VAP protocol (if indicated): yes DVT prophylaxis: eliquis GI prophylaxis: n/a Glucose control: monitor  Mobility: PT/OT following  Disposition: awaiting discharge to home with [facility]  Goals of Care:  Last date of multidisciplinary goals of care discussion 2/16 Family and staff present: [place], husband Earvin Hansen Summary of discussion: full code Follow up goals of care discussion due: n/a Code Status: full   Labs   CBC: Recent Labs  Lab 06/12/20 0814 06/17/20 0114  06/18/20 0120 06/19/20 0249  WBC 8.8 7.0 7.5 8.0  HGB 7.6* 7.1* 7.1* 7.2*  HCT 24.4* 23.5* 23.6* 24.8*  MCV 92.4 94.4 93.3 95.4  PLT 157 244 264 279    Basic Metabolic Panel: Recent Labs  Lab 06/12/20 0814 06/14/20 0114 06/17/20 0114 06/19/20 0249  NA 140 143 142 144  K 3.7 4.2 4.2 4.1  CL 96* 98 100 101  CO2 35* 37* 34* 32  GLUCOSE 92 124* 111* 160*  BUN 22* 23* 21* 16  CREATININE 0.32* <0.30* 0.34* 0.35*  CALCIUM 9.1 9.1 9.2 9.1   GFR: Estimated Creatinine Clearance: 67.8 mL/min (A) (by C-G formula based on SCr of 0.35 mg/dL (L)). Recent Labs  Lab 06/12/20 0814 06/17/20 0114 06/18/20 0120 06/19/20 0249  WBC 8.8 7.0 7.5 8.0    Liver Function Tests: Recent Labs  Lab 06/17/20 0114  AST 40  ALT 46*  ALKPHOS 83  BILITOT 0.5  PROT 5.8*  ALBUMIN 2.4*   No results for input(s): LIPASE, AMYLASE in the last 168 hours. No results for input(s): AMMONIA in the last 168 hours.  ABG    Component Value Date/Time   PHART 7.495 (H) 05/19/2020 1055   PCO2ART 47.1 05/19/2020 1055   PO2ART 155 (H) 05/19/2020 1055   HCO3 33.0 (H) 06/03/2020 0052   TCO2 35 (H) 06/03/2020 0052   O2SAT 99.0 06/03/2020 0052     Coagulation Profile: No results for input(s): INR, PROTIME in the last 168 hours.  Cardiac Enzymes: Recent Labs  Lab 06/18/20 1020  CKTOTAL 30*    HbA1C: Hgb A1c MFr Bld  Date/Time Value Ref Range Status  02/26/2020 05:18 PM 5.4 4.8 - 5.6 % Final    Comment:    (NOTE) Pre diabetes:          5.7%-6.4%  Diabetes:              >6.4%  Glycemic control for   <7.0% adults with diabetes   12/22/2018 04:37 AM 5.5 4.8 - 5.6 % Final    Comment:    (NOTE) Pre diabetes:          5.7%-6.4% Diabetes:              >6.4% Glycemic control for   <7.0% adults with diabetes     CBG: Recent Labs  Lab 06/18/20 1527 06/18/20 1908 06/18/20 2309 06/19/20 0308 06/19/20 0714  GLUCAP 163* 153* 116* 139* 113*     Critical care time: n/a      Joneen Roach, AGACNP-BC [facility]  See Amion for personal pager PCCM on call pager [phone] until 7pm. Please call Elink 7p-7a. [phone]  06/19/2020 7:30 AM

## 2020-06-19 NOTE — TOC Progression Note (Signed)
Transition of Care [facility]) - Progression Note    Patient Details  Name: Kathleen Cameron MRN: [id number] Date of Birth: August 01, 1965  Transition [facility]) CM/SW Contact  Epifanio Lesches, RN Phone Number: 06/19/2020, 2:42 PM  Clinical Narrative:    NCM reached out to [facility] insurance  [phone]) to learn of insurance  authorization status for [facility], adult nursing for home vent trach ( 56 hrs).NCM spoke with [facility] rep. Sheryl T. Sheryl stated case is still pending , however, case has been escalated as of today, case # [id number]. TOC team will continue to monitor and assist with needs....  Expected Discharge Plan: Home w Home Health Services Barriers to Discharge: Other (comment) (Awaitng authorization for [facility] hours ( Adult nursing for home vent/trach))  Expected Discharge Plan and Services Expected Discharge Plan: Home w Home Health Services   Discharge Planning Services: CM Consult Post Acute Care Choice: Home Health,Durable Medical Equipment Living arrangements for the past 2 months: Apartment                                       Social Determinants of Health (SDOH) Interventions    Readmission Risk Interventions Readmission Risk Prevention Plan 02/02/2020  Transportation Screening Complete  PCP or Specialist Appt within 3-5 Days Complete  HRI or Home Care Consult Complete  Social Work Consult for Recovery Care Planning/Counseling Complete  Palliative Care Screening Not Applicable  Medication Review [employer]) Complete  Some recent data might be hidden

## 2020-06-20 DIAGNOSIS — J9622 Acute and chronic respiratory failure with hypercapnia: Secondary | ICD-10-CM | POA: Diagnosis not present

## 2020-06-20 DIAGNOSIS — J9621 Acute and chronic respiratory failure with hypoxia: Secondary | ICD-10-CM | POA: Diagnosis not present

## 2020-06-20 DIAGNOSIS — J449 Chronic obstructive pulmonary disease, unspecified: Secondary | ICD-10-CM | POA: Diagnosis not present

## 2020-06-20 DIAGNOSIS — J9601 Acute respiratory failure with hypoxia: Secondary | ICD-10-CM | POA: Diagnosis not present

## 2020-06-20 LAB — GLUCOSE, CAPILLARY
Glucose-Capillary: 83 mg/dL (ref 70–99)
Glucose-Capillary: 89 mg/dL (ref 70–99)
Glucose-Capillary: 100 mg/dL — ABNORMAL HIGH (ref 70–99)
Glucose-Capillary: 115 mg/dL — ABNORMAL HIGH (ref 70–99)
Glucose-Capillary: 104 mg/dL — ABNORMAL HIGH (ref 70–99)
Glucose-Capillary: 183 mg/dL — ABNORMAL HIGH (ref 70–99)

## 2020-06-20 MED ORDER — OSMOLITE 1.5 CAL PO LIQD
237.0000 mL | Freq: Two times a day (BID) | ORAL | Status: DC
  Administered 2020-06-20 – 2020-07-02 (×22): 237 mL
  Filled 2020-06-20 (×26): qty 237

## 2020-06-20 MED ORDER — CHLORHEXIDINE GLUCONATE 0.12 % MT SOLN
OROMUCOSAL | Status: AC
  Filled 2020-06-20: qty 15

## 2020-06-20 MED ORDER — CLONAZEPAM 1 MG PO TABS
1.0000 mg | ORAL_TABLET | Freq: Two times a day (BID) | ORAL | Status: DC
  Administered 2020-06-20 – 2020-07-02 (×24): 1 mg
  Filled 2020-06-20 (×24): qty 1

## 2020-06-20 NOTE — Progress Notes (Addendum)
END OF SHIFT NOTE:  Patient resting in bed with eyes closed near end of shift.   Neuro: when awake patient does nod appropriately and tries to speak, some words are audible. Patient denies any pain throughout shift and slept well during night.  Resp: patient continues with Tracheostomy ventilation. Shiley size 6. Trach attached to home vent with 5L O2 connected to oxygen at the wall. Trach clean dry and intact. FiO2 40%, saturation 100%. No changes to home vent settings.  Cardiac: Patient ST throughout shift. Heart rate in 120's when awake and 110's when sleeping. BP stable  GI/GU: patient continues with pure wick in place. U/O during shift total 1050 mL. No BM noted during shift. Patient continues with G-tube to left abd. Clean gauze in place. Patient does eat with trach on regular diet, poor appetite but does well with ensure supplement drinks.   Skin: Patient does have redness to sacrum and right ankle. Vaginal rash due to pure wick as well.   Plan: Patient awaiting insurance approval to return home with home health.   Safety measures remain in place. Trach supplies at bedside, obturator hung at head of bed and trach checklist hung in room.   Will continue to monitor and SBARR to day shift nurse at change of shift.

## 2020-06-20 NOTE — Progress Notes (Addendum)
Nutrition Follow-up  DOCUMENTATION CODES:   Non-severe (moderate) malnutrition in context of chronic illness  INTERVENTION:   Continue bolus TF regimen via PEG:  Osmolite 1.5 237 (1 carton ) decrease to BID between meals  Continue Prosource TF 45 ml BID  Provides 790 kcal and 52 gm protein per day  Encourage POs.  Continue Ensure Enlive po BID, each supplement provides 350 kcal and 20 grams of protein  NUTRITION DIAGNOSIS:   Moderate Malnutrition related to chronic illness (COPD) as evidenced by mild muscle depletion,mild fat depletion.  Ongoing   GOAL:   Patient will meet greater than or equal to 90% of their needs  Met   MONITOR:   PO intake,Supplement acceptance,Labs,Weight trends,TF tolerance,Skin  REASON FOR ASSESSMENT:   Consult Enteral/tube feeding initiation and management,Calorie Count  ASSESSMENT:   55 yo female admitted from home with ventilator malfunction. She was discharged home from [place] and required re-admission to [facility] on the same day. PMH includes HTN, asthma, COPD, seizure disorder, anxiety disorder.  Calorie count: Diet: Regular Supplements: Ensure Enlive PO TID Bolus TF: Osmolite 1.5 237 ml (1 carton ) TID between meals with Prosource TF 45 ml BID via PEG, providing 1147 kcal, 67 gm protein, 583 ml free water daily  Breakfast: 200 kcal, 1 gm protein Lunch: 0 kcal,  0 gm protein Dinner: 10 kcal, 0 gm protein Supplements (drank 2 Ensure Enlive supplements): 700 kcal, 40 gm protein  Total PO intake: 910 kcal (59% of minimum estimated needs)  41 gm protein (51% of minimum estimated needs)  Patient is meeting ~ half of her estimated nutrition needs PO.  Will need to continue bolus TF to meet ~half of her nutrition needs. She likes the Ensure Enlive supplements, but is only able to drink 2 per day.    Diet Order:   Diet Order            Diet regular Room service appropriate? Yes; Fluid consistency: Thin  Diet effective now                  EDUCATION NEEDS:   Not appropriate for education at this time  Skin:  Skin Assessment: Skin Integrity Issues: Skin Integrity Issues:: Stage II Stage II: R buttocks, R ankle  Last BM:  3/1 type 4  Height:   Ht Readings from Last 1 Encounters:  06/13/20 5' 4.02" (1.626 m)    Weight:   Wt Readings from Last 1 Encounters:  06/20/20 56 kg    BMI:  Body mass index is 21.18 kg/m.  Estimated Nutritional Needs:   Kcal:  1550-1750  Protein:  80-95 gm  Fluid:  >/= 1.5 L    Lucas Mallow, RD, LDN, CNSC Please refer to Amion for contact information.

## 2020-06-20 NOTE — Progress Notes (Signed)
NAME:  Kathleen Cameron, MRN:  [id number], DOB:  November 09, 1965, LOS: 17 ADMISSION DATE:  06/03/2020, CONSULTATION DATE:  2/12 REFERRING MD:  Pilar Plate, CHIEF COMPLAINT:  Dyspnea   Brief History:  55 year old female presented from home via EMS with complaints of acute respiratory distress after home ventilator failed.  [facility]  PCCM consulted for further management and admission. Diagnosed with HCAP and COPD exacerbation on admission.   Past Medical History:  Seizure disorder Hypertension Severe COPD Acute on chronic hypoxic and hypercapnic respiratory failure Anxiety  Significant Hospital Events:  Admitted 2/13  Consults:    Procedures:  Tracheostomy present prior to admission  Significant Diagnostic Tests:  2/23 Vas ultrasound upper extre> left subclavian DVT and superficial vein thrombosis FEES 2/25 > without PMSV no aspiration, rec regular texture diet with thin liquids  Micro Data:  2/13 sars cov 2/flu > negative 2/13 blood > staph hominis 1/4 2/13 resp > OPF 2/16 blood > neg  Antimicrobials:  Cefepime 2/13 > 2/19 Vanc 2/13 x1  Doxycycline 3/1 >>>  Interim History / Subjective:   No acute events overnight  Objective   Blood pressure 112/77, pulse (!) 116, temperature 98.5 F (36.9 C), temperature source Oral, resp. rate 16, height 5' 4.02" (1.626 m), weight 56 kg, SpO2 100 %.    Vent Mode: AC FiO2 (%):  [40 %] 40 % Set Rate:  [16 bmp] 16 bmp Vt Set:  [480 mL] 480 mL PEEP:  [5 cmH20] 5 cmH20 Plateau Pressure:  [15 cmH20-23 cmH20] 21 cmH20   Intake/Output Summary (Last 24 hours) at 06/20/2020 0723 Last data filed at 06/20/2020 0600 Gross per 24 hour  Intake 427 ml  Output 1700 ml  Net -1273 ml   Filed Weights   06/18/20 0500 06/19/20 0500 06/20/20 0500  Weight: 55.6 kg 55.2 kg 56 kg    Examination:   General: adult female awake on vent eating breakfast  HENT: [place]/AT, PERRL, no JVD PULM: Vent supported breaths. Rhonchi R>L.  No distress CV: Tachy 116, regular, no MRG GI: Soft, non-tender BS+ MSK:  R foot remains tender, but no erythema or edema. LUE with persisting edema, but erythema improved.  Neuro: Awake, alert, oriented.    Resolved Hospital Problem list     Assessment & Plan:  Acute on chronic hypoxemic and hypercapneic resp failure due to COPD Chronic respiratory failure with hypercarbia on home mechanical ventilation - Continue full vent support - Brovana/pulmicort/yupelri to continue  Provoked left subclavian DVT - 3 months eliquis (2/22 > 5/22)  Anemia, normocytic without evidence of bleeding: hgb remains stable in the 7-8 range - Monitor for signs of bleeding - Transfuse for hemoglobin less than 7   Hypertension - monitor off medications  Hypothyroidism - continue synthroid  Acute metabolic encephalopathy Anxiety - Clonazepam, temazepam, venlafaxine to continue - Weaning clonazepam to BID 3/2  Right lower extremity pain: No edema or erythema. Pulses intact. X-rays negative  - PRN oxycodone - Gabapentin scheduled  Left upper extremity edema: doppler study 2/22 with L subclavian DVT. Low grade fevers and tachycardia - Doxycyline 5 days stop after 3/5 doses  Nutrition needs - 48 hour calorie count ongoing. Hopeful she can limit or stop tube feeds altogether.   Best practice (evaluated daily)  Diet: regular diet and tube feeds Pain/Anxiety/Delirium protocol (if indicated): n/a VAP protocol (if indicated): yes DVT prophylaxis: eliquis GI prophylaxis: n/a Glucose control: monitor  Mobility: PT/OT following Disposition: awaiting discharge to home with [facility]  Goals of  Care:  Last date of multidisciplinary goals of care discussion 2/16 Family and staff present: Kendrick Fries, husband Earvin Hansen Summary of discussion: full code Follow up goals of care discussion due: n/a Code Status: full   Labs   CBC: Recent Labs  Lab 06/17/20 0114 06/18/20 0120 06/19/20 0249  WBC 7.0 7.5 8.0   HGB 7.1* 7.1* 7.2*  HCT 23.5* 23.6* 24.8*  MCV 94.4 93.3 95.4  PLT 244 264 279    Basic Metabolic Panel: Recent Labs  Lab 06/14/20 0114 06/17/20 0114 06/19/20 0249  NA 143 142 144  K 4.2 4.2 4.1  CL 98 100 101  CO2 37* 34* 32  GLUCOSE 124* 111* 160*  BUN 23* 21* 16  CREATININE <0.30* 0.34* 0.35*  CALCIUM 9.1 9.2 9.1   GFR: Estimated Creatinine Clearance: 67.8 mL/min (A) (by C-G formula based on SCr of 0.35 mg/dL (L)). Recent Labs  Lab 06/17/20 0114 06/18/20 0120 06/19/20 0249  WBC 7.0 7.5 8.0    Liver Function Tests: Recent Labs  Lab 06/17/20 0114  AST 40  ALT 46*  ALKPHOS 83  BILITOT 0.5  PROT 5.8*  ALBUMIN 2.4*   No results for input(s): LIPASE, AMYLASE in the last 168 hours. No results for input(s): AMMONIA in the last 168 hours.  ABG    Component Value Date/Time   PHART 7.495 (H) 05/19/2020 1055   PCO2ART 47.1 05/19/2020 1055   PO2ART 155 (H) 05/19/2020 1055   HCO3 33.0 (H) 06/03/2020 0052   TCO2 35 (H) 06/03/2020 0052   O2SAT 99.0 06/03/2020 0052     Coagulation Profile: No results for input(s): INR, PROTIME in the last 168 hours.  Cardiac Enzymes: Recent Labs  Lab 06/18/20 1020  CKTOTAL 30*    HbA1C: Hgb A1c MFr Bld  Date/Time Value Ref Range Status  02/26/2020 05:18 PM 5.4 4.8 - 5.6 % Final    Comment:    (NOTE) Pre diabetes:          5.7%-6.4%  Diabetes:              >6.4%  Glycemic control for   <7.0% adults with diabetes   12/22/2018 04:37 AM 5.5 4.8 - 5.6 % Final    Comment:    (NOTE) Pre diabetes:          5.7%-6.4% Diabetes:              >6.4% Glycemic control for   <7.0% adults with diabetes     CBG: Recent Labs  Lab 06/19/20 0714 06/19/20 1109 06/19/20 1920 06/19/20 2318 06/20/20 0317  GLUCAP 113* 123* 118* 138* 83     Critical care time: n/a      Joneen Roach, AGACNP-BC [facility]  See Amion for personal pager PCCM on call pager [phone] until 7pm. Please call  [facility] 7p-7a. [phone]  06/20/2020 7:41 AM

## 2020-06-20 NOTE — Plan of Care (Signed)
  Problem: Activity: Goal: Ability to tolerate increased activity will improve Outcome: Progressing   Problem: Respiratory: Goal: Ability to maintain a clear airway and adequate ventilation will improve Outcome: Progressing   Problem: Education: Goal: Knowledge of General Education information will improve Description: Including pain rating scale, medication(s)/side effects and non-pharmacologic comfort measures Outcome: Progressing   Problem: Health Behavior/Discharge Planning: Goal: Ability to manage health-related needs will improve Outcome: Progressing   Problem: Clinical Measurements: Goal: Ability to maintain clinical measurements within normal limits will improve Outcome: Progressing Goal: Will remain free from infection Outcome: Progressing Goal: Diagnostic test results will improve Outcome: Progressing Goal: Respiratory complications will improve Outcome: Progressing Goal: Cardiovascular complication will be avoided Outcome: Progressing   Problem: Activity: Goal: Risk for activity intolerance will decrease Outcome: Progressing   Problem: Nutrition: Goal: Adequate nutrition will be maintained Outcome: Progressing   Problem: Coping: Goal: Level of anxiety will decrease Outcome: Progressing   Problem: Elimination: Goal: Will not experience complications related to bowel motility Outcome: Progressing Goal: Will not experience complications related to urinary retention Outcome: Progressing   Problem: Pain Managment: Goal: General experience of comfort will improve Outcome: Progressing   Problem: Safety: Goal: Ability to remain free from injury will improve Outcome: Progressing   Problem: Skin Integrity: Goal: Risk for impaired skin integrity will decrease Outcome: Progressing   

## 2020-06-20 NOTE — Progress Notes (Signed)
Occupational Therapy Treatment Patient Details Name: Kathleen Cameron MRN: [id number] DOB: October 19, 1965 Today's Date: 06/20/2020    History of present illness 55 y.o. female admitted 2/13 from home with respiratory distress due to home vent malfunction after same day D/C from [facility] with home vent. PMHx pt with [facility] admission 11/6 with trach 03/02/20 ant transition to [facility] 11/18-2/12, 04/25/19 Gtube placed, COPD, hypertension, depression/anxiety, HLD, shingles infection,   OT comments  Patient continues to progress with OOB activity tolerance needing assist with trunk support to sit upright. Worked on dynamic stabilization EOB for improved trunk control, patient with anterior bias needing cues to push back into hand to correct posture. With B knees blocked, pt able to provide minimal assist to power up to standing with max A x2, tolerated ~10 seconds before return EOB. Provided patient and spouse education re: positioning for UEs for edema management for L UE and supporting R elbow with pillow to facilitate self feeding. Patient will continue to benefit from acute OT in order to facilitate D/C home with 24/7 support and [facility] services.  Patient on home vent settings FiO2 40 Peep 5  HR 103 BP EOB 106/80-denies dizziness    Follow Up Recommendations  Home health OT;Supervision/Assistance - 24 hour;Other (comment) (if family can manage hoyer lift)    Equipment Recommendations  None recommended by OT       Precautions / Restrictions Precautions Precautions: Fall Precaution Comments: trach, on home vent Restrictions Weight Bearing Restrictions: No       Mobility Bed Mobility Overal bed mobility: Needs Assistance Bed Mobility: Rolling;Supine to Sit;Sit to Supine Rolling: Mod assist   Supine to sit: Mod assist;HOB elevated Sit to supine: Mod assist   General bed mobility comments: with positioning of LEs and min A at trunk patient able to roll L to R, increased time and tactile cues  to bring LEs to EOB needing mod A for trunk support to sit upright. pt able to initiate laying back onto bed needing assist to lift LEs    Transfers Overall transfer level: Needs assistance Equipment used: None Transfers: Sit to/from Stand Sit to Stand: Max assist;+2 physical assistance         General transfer comment: bilateral knees blocked and use of gait belt, pt able to rock forward for momentum and bear some weight in LEs for ~10 seconds in standing with max A x2    Balance Overall balance assessment: Needs assistance Sitting-balance support: Feet supported;No upper extremity supported Sitting balance-Leahy Scale: Fair Sitting balance - Comments: patient able to sit with hands in lap and close supervision statically   Standing balance support: Bilateral upper extremity supported Standing balance-Leahy Scale: Zero Standing balance comment: B UE support and Max A x2                           ADL either performed or assessed with clinical judgement   ADL Overall ADL's : Needs assistance/impaired Eating/Feeding: Supervision/ safety;Bed level;Set up Eating/Feeding Details (indicate cue type and reason): with close supervision of cup patient able to bring hand to mouth to drink with R elbow supported by pillow. would benefit from red foam handles and adaptive cup, will provide to patient and continue to trial                 Lower Body Dressing: Total assistance;Bed level Lower Body Dressing Details (indicate cue type and reason): to don socks Toilet Transfer: Maximal assistance;+2 for physical  assistance;+2 for safety/equipment Toilet Transfer Details (indicate cue type and reason): practice sit to stand from EOB with bilateral blocking of knees patient was able to initiate rocking forward and bear some weight through LEs needing max A x2 for support/balance. tolerate standing ~ 10 seconds before returned to sitting         Functional mobility during ADLs:  Maximal assistance;+2 for physical assistance       Cognition Arousal/Alertness: Awake/alert Behavior During Therapy: WFL for tasks assessed/performed Overall Cognitive Status: Difficult to assess                                 General Comments: patient does have difficulty with visual attention needing cues to focus on who is speaking, intermittent repeated directions to initiate tasks        Exercises Exercises: Other exercises Other Exercises Other Exercises: seated EOB worked on dyanmic stabilization posterior-anterior as well as alternating lateral weight shift from forearms in order to improve core stability necessary for sitting + standing balance           Pertinent Vitals/ Pain       Pain Assessment: Faces Faces Pain Scale: Hurts a little bit Pain Location: grimace with L UE ROM Pain Descriptors / Indicators: Grimacing Pain Intervention(s): Monitored during session         Frequency  Min 2X/week        Progress Toward Goals  OT Goals(current goals can now be found in the care plan section)  Progress towards OT goals: Progressing toward goals  Acute Rehab OT Goals Patient Stated Goal: agreeable to OOB activity OT Goal Formulation: With patient/family Time For Goal Achievement: 06/29/20 Potential to Achieve Goals: Fair ADL Goals Pt Will Perform Grooming: with set-up;with supervision;sitting (EOB or in recliner 3 tasks) Pt Will Perform Upper Body Bathing: with set-up;with supervision;sitting (EOB or in recliner) Pt/caregiver will Perform Home Exercise Program: Increased ROM;Both right and left upper extremity;With minimal assist;With written HEP provided (A/AAROM shoulders, AROM elbows distally) Additional ADL Goal #1: Pt will able to sit EOB 10 minutes without LOB for basic ADLs Additional ADL Goal #2: Pt will able to actively reach to 90 degrees with Bil shoulders to grab items while seated EOB or in recliner  Plan Discharge plan remains  appropriate       AM-PAC OT "6 Clicks" Daily Activity     Outcome Measure   Help from another person eating meals?: A Little Help from another person taking care of personal grooming?: A Little Help from another person toileting, which includes using toliet, bedpan, or urinal?: Total Help from another person bathing (including washing, rinsing, drying)?: A Lot Help from another person to put on and taking off regular upper body clothing?: Total Help from another person to put on and taking off regular lower body clothing?: Total 6 Click Score: 11    End of Session Equipment Utilized During Treatment: Other (comment);Gait belt (vent)  OT Visit Diagnosis: Muscle weakness (generalized) (M62.81);Other abnormalities of gait and mobility (R26.89);Pain Pain - Right/Left: Left Pain - part of body: Shoulder   Activity Tolerance Patient tolerated treatment well   Patient Left in bed;with call bell/phone within reach;with family/visitor present   Nurse Communication Mobility status;Other (comment) (pt in chair position)        Time: 5885-0277 OT Time Calculation (min): 40 min  Charges: OT General Charges $OT Visit: 1 Visit OT Treatments $Self Care/Home Management :  23-37 mins $Therapeutic Exercise: 8-22 mins  Marlyce Huge OT OT pager: [phone]   Carmelia Roller 06/20/2020, 2:32 PM

## 2020-06-21 DIAGNOSIS — J449 Chronic obstructive pulmonary disease, unspecified: Secondary | ICD-10-CM | POA: Diagnosis not present

## 2020-06-21 DIAGNOSIS — J9621 Acute and chronic respiratory failure with hypoxia: Secondary | ICD-10-CM | POA: Diagnosis not present

## 2020-06-21 DIAGNOSIS — J9601 Acute respiratory failure with hypoxia: Secondary | ICD-10-CM | POA: Diagnosis not present

## 2020-06-21 DIAGNOSIS — F419 Anxiety disorder, unspecified: Secondary | ICD-10-CM | POA: Diagnosis not present

## 2020-06-21 DIAGNOSIS — J9622 Acute and chronic respiratory failure with hypercapnia: Secondary | ICD-10-CM | POA: Diagnosis not present

## 2020-06-21 LAB — BASIC METABOLIC PANEL
Anion gap: 10 (ref 5–15)
BUN: 23 mg/dL — ABNORMAL HIGH (ref 6–20)
CO2: 33 mmol/L — ABNORMAL HIGH (ref 22–32)
Calcium: 9 mg/dL (ref 8.9–10.3)
Chloride: 98 mmol/L (ref 98–111)
Creatinine, Ser: 0.39 mg/dL — ABNORMAL LOW (ref 0.44–1.00)
GFR, Estimated: >60 mL/min (ref >60)
Glucose, Bld: 149 mg/dL — ABNORMAL HIGH (ref 70–99)
Potassium: 4.3 mmol/L (ref 3.5–5.1)
Sodium: 141 mmol/L (ref 135–145)

## 2020-06-21 LAB — CBC
HCT: 22.3 % — ABNORMAL LOW (ref 36.0–46.0)
Hemoglobin: 6.6 g/dL — CL (ref 12.0–15.0)
MCH: 28.1 pg (ref 26.0–34.0)
MCHC: 29.6 g/dL — ABNORMAL LOW (ref 30.0–36.0)
MCV: 94.9 fL (ref 80.0–100.0)
Platelets: 268 10*3/uL (ref 150–400)
RBC: 2.35 MIL/uL — ABNORMAL LOW (ref 3.87–5.11)
RDW: 19.3 % — ABNORMAL HIGH (ref 11.5–15.5)
WBC: 8.9 10*3/uL (ref 4.0–10.5)
nRBC: 0.2 % (ref 0.0–0.2)

## 2020-06-21 LAB — PREPARE RBC (CROSSMATCH)

## 2020-06-21 LAB — GLUCOSE, CAPILLARY
Glucose-Capillary: 111 mg/dL — ABNORMAL HIGH (ref 70–99)
Glucose-Capillary: 120 mg/dL — ABNORMAL HIGH (ref 70–99)
Glucose-Capillary: 124 mg/dL — ABNORMAL HIGH (ref 70–99)
Glucose-Capillary: 126 mg/dL — ABNORMAL HIGH (ref 70–99)
Glucose-Capillary: 140 mg/dL — ABNORMAL HIGH (ref 70–99)
Glucose-Capillary: 184 mg/dL — ABNORMAL HIGH (ref 70–99)
Glucose-Capillary: 89 mg/dL (ref 70–99)

## 2020-06-21 LAB — HEMOGLOBIN A1C
Hgb A1c MFr Bld: 5.3 % (ref 4.8–5.6)
Mean Plasma Glucose: 105.41 mg/dL

## 2020-06-21 LAB — HEMOGLOBIN AND HEMATOCRIT, BLOOD
HCT: 26.8 % — ABNORMAL LOW (ref 36.0–46.0)
Hemoglobin: 8.2 g/dL — ABNORMAL LOW (ref 12.0–15.0)

## 2020-06-21 LAB — MAGNESIUM: Magnesium: 1.8 mg/dL (ref 1.7–2.4)

## 2020-06-21 LAB — PHOSPHORUS: Phosphorus: 3.4 mg/dL (ref 2.5–4.6)

## 2020-06-21 MED ORDER — SODIUM CHLORIDE 0.9% IV SOLUTION: Freq: Once | INTRAVENOUS | Status: DC

## 2020-06-21 MED ORDER — COLCHICINE 0.6 MG PO TABS
1.2000 mg | ORAL_TABLET | Freq: Once | ORAL | Status: AC
  Administered 2020-06-21: 1.2 mg
  Filled 2020-06-21: qty 2

## 2020-06-21 MED ORDER — COLCHICINE 0.6 MG PO TABS
0.6000 mg | ORAL_TABLET | Freq: Every day | ORAL | Status: DC
  Administered 2020-06-21 – 2020-07-02 (×12): 0.6 mg
  Filled 2020-06-21 (×13): qty 1

## 2020-06-21 MED ORDER — FUROSEMIDE 10 MG/ML IJ SOLN
20.0000 mg | Freq: Once | INTRAMUSCULAR | Status: AC
  Administered 2020-06-21: 20 mg via INTRAVENOUS
  Filled 2020-06-21: qty 2

## 2020-06-21 MED ORDER — INSULIN ASPART 100 UNIT/ML ~~LOC~~ SOLN
0.0000 [IU] | SUBCUTANEOUS | Status: DC
  Administered 2020-06-21 (×2): 1 [IU] via SUBCUTANEOUS
  Administered 2020-06-21: 2 [IU] via SUBCUTANEOUS
  Administered 2020-06-21 – 2020-06-30 (×5): 1 [IU] via SUBCUTANEOUS

## 2020-06-21 NOTE — Progress Notes (Signed)
Physical Therapy Treatment/ Reevaluation Patient Details Name: Kathleen Cameron MRN: [id number] DOB: Aug 16, 1965 Today's Date: 06/21/2020    History of Present Illness 55 y.o. female admitted 2/13 from home with respiratory distress due to home vent malfunction after same day D/C from [facility] with home vent. PMHx pt with [facility] admission 11/6 with trach 03/02/20 ant transition to [facility] 11/18-2/12, 04/25/19 Gtube placed, COPD, hypertension, depression/anxiety, HLD, shingles infection,    PT Comments    Pt with PT order discontinued 2/26 and reorder 3/2 without significant change in pt status or deficits. Reevaluation completed but limited by fatigue and low Hgb. Pt on home vent AC at 40% FIO2 with SpO2 100% and HR 116. Pt with continued weakness, impaired transfers and function who will continue to benefit from acute therapy to maximize mobility, safety and function. Encouraged daily ROM and elevation of bil UE throughout the day to assist with edema with retrograde massage to left hand/forearm provided.      Follow Up Recommendations  SNF;Supervision/Assistance - 24 hour     Equipment Recommendations  None recommended by PT    Recommendations for Other Services       Precautions / Restrictions Precautions Precautions: Fall;Other (comment) Precaution Comments: trach, on home vent Restrictions Weight Bearing Restrictions: No    Mobility  Bed Mobility               General bed mobility comments: pt deferred bed mobility due to fatigue today (low Hgb and getting blood). In chair position of bed throughout    Transfers                    Ambulation/Gait                 Stairs             Wheelchair Mobility    Modified Rankin (Stroke Patients Only)       Balance                                            Cognition Arousal/Alertness: Awake/alert Behavior During Therapy: WFL for tasks assessed/performed Overall  Cognitive Status: Difficult to assess                                 General Comments: pt mouthing words, nodding and following commands. Trach and difficult to read lips at times      Exercises General Exercises - Upper Extremity Shoulder Flexion: AAROM;Both;Seated;15 reps Elbow Flexion: AAROM;Both;Supine;15 reps;Seated Elbow Extension: AAROM;Both;15 reps;Seated General Exercises - Lower Extremity Long Arc Quad: AAROM;Both;Seated;15 reps Hip ABduction/ADduction: AAROM;Both;Seated;15 reps Hip Flexion/Marching: AAROM;Both;Seated;15 reps    General Comments        Pertinent Vitals/Pain Pain Score: 6  Pain Location: LUE elbow and fingers, bil great toe Pain Descriptors / Indicators: Grimacing;Guarding Pain Intervention(s): Limited activity within patient's tolerance;Monitored during session;Repositioned    Home Living                      Prior Function            PT Goals (current goals can now be found in the care plan section) Acute Rehab PT Goals Patient Stated Goal: agreeable to strengthening and progressive activity PT Goal Formulation: With patient Time For Goal Achievement: 07/05/20 Potential to  Achieve Goals: Fair Progress towards PT goals: Progressing toward goals (goals updated appropriately)    Frequency    Min 2X/week      PT Plan Current plan remains appropriate    Co-evaluation              AM-PAC PT "6 Clicks" Mobility   Outcome Measure  Help needed turning from your back to your side while in a flat bed without using bedrails?: A Lot Help needed moving from lying on your back to sitting on the side of a flat bed without using bedrails?: A Lot Help needed moving to and from a bed to a chair (including a wheelchair)?: Total Help needed standing up from a chair using your arms (e.g., wheelchair or bedside chair)?: Total Help needed to walk in hospital room?: Total Help needed climbing 3-5 steps with a railing? :  Total 6 Click Score: 8    End of Session   Activity Tolerance: Patient tolerated treatment well Patient left: in bed;with call bell/phone within reach Nurse Communication: Mobility status PT Visit Diagnosis: Other abnormalities of gait and mobility (R26.89);Difficulty in walking, not elsewhere classified (R26.2);Muscle weakness (generalized) (M62.81)     Time: 1583-0940 PT Time Calculation (min) (ACUTE ONLY): 22 min  Charges:                        Merryl Hacker, PT Acute Rehabilitation Services Pager: [phone] Office: [phone]    Martrell Eguia B Inesha Sow 06/21/2020, 9:06 AM

## 2020-06-21 NOTE — TOC Progression Note (Signed)
Transition of Care [facility]) - Progression Note    Patient Details  Name: Kathleen Cameron MRN: [id number] Date of Birth: 01/29/1966  [facility] CM/SW Contact  Epifanio Lesches, RN Phone Number: 06/21/2020, 12:09 PM  Clinical Narrative:    NCM learned insurance authorization received for Adult Nursing ( 56 hrs) per [facility] [phone]). Kenney Houseman explained [name] is not ready to provide services today 2/2 staffing. States will f/u with NCM  after director has spoken with husband.   TOC team will continue to monitor and assist with needs.....  Expected Discharge Plan: Home w Home Health Services Barriers to Discharge: Other (comment)  Expected Discharge Plan and Services Expected Discharge Plan: Home w Home Health Services   Discharge Planning Services: CM Consult Post Acute Care Choice: Home Health,Durable Medical Equipment Living arrangements for the past 2 months: Apartment                                       Social Determinants of Health (SDOH) Interventions    Readmission Risk Interventions Readmission Risk Prevention Plan 02/02/2020  Transportation Screening Complete  PCP or Specialist Appt within 3-5 Days Complete  HRI or Home Care Consult Complete  Social Work Consult for Recovery Care Planning/Counseling Complete  Palliative Care Screening Not Applicable  Medication Review Oceanographer) Complete  Some recent data might be hidden

## 2020-06-21 NOTE — Progress Notes (Signed)
eLink Physician-Brief Progress Note Patient Name: Kathleen Cameron DOB: 10/15/1965 MRN: [id number]   Date of Service  06/21/2020  HPI/Events of Note  Hyperglycemia - Blood glucose = 183.   eICU Interventions  Plan: 1. Q 4 hour sensitive Novolog SSI.     Intervention Category Major Interventions: Hyperglycemia - active titration of insulin therapy  Sommer,Steven Dennard Nip 06/21/2020, 12:25 AM

## 2020-06-21 NOTE — Progress Notes (Signed)
PROGRESS NOTE    Kathleen Cameron  INO:[id number] DOB: 05-28-1965 DOA: 06/03/2020 PCP: Horald Pollen, MD   Brief Narrative:  HPI On 06/03/2020 by Ms. Merlene Laughter, NP (PCCM) Kathleen Cameron is a 55 y.o. with a past medical history significant for seizure disorder, hypertension, severe COPD, acute on chronic hypoxic and hypercapnic respiratory failure, and anxiety who presented to the ED via EMS with acute on chronic hypoxic and hypercapnic respiratory failure.  Of [facility]  Primary history obtained from chart review as patient is currently on ventilator through chronic trach.  It appears patient was treated at this facility November 2021 for acute on chronic hypoxic respiratory failure she required endotracheal intubation and eventual tracheostomy due to difficulty weaning 03/02/2020.  On 11/18 [facility]  Upon discharge home per chart review it appears patient's home ventilator malfunction much with resulted in respiratory failure and need for bag mask ventilation.  On arrival to ED patient was placed on ventilator with home vent settings with no further acute respiratory compromise.  PCCM was called for further management and admission.   Interim history Patient was admitted with acute respiratory distress after home ventilator failed.  She is admitted with acute on chronic hypoxic and hypercapnic respiratory failure, COPD.  Patient was also found to have a left subclavian DVT, and is since placed on Eliquis.  She continues to need ventilator assistance.  Plan is for home with home health on 06/25/2020. Assessment & Plan   Acute on chronic hypoxic and hypercapnic respiratory failure secondary to COPD exacerbation -Currently requiring home mechanical ventilation.  Patient was initially admitted in November 2021 for respiratory failure and required intubation at that time was  unable to wean and had tracheostomy placed.  [facility] then was discharged home in February 2022. -Currently on full ventilator support -Patient was placed on prednisone for 5 days -Continue Brovana, Pulmicort, Yupelri nebs  Severe sepsis -Present on admission -Suspect secondary to pneumonia- VAP vs HAP -Patient was placed on antibiotics and completed 7 days -Blood cultures showed staph hominis bacteremia which was presumed to be a contamination-patient was placed on Vancomycin and cefepime  Provoked left subclavian DVT/edema -Patient currently on Eliquis, will require this for 3 months through 09/09/2020 -Patient was noted to have low-grade fevers or tachycardia -Doppler was done on 05/2220 showing left subclavian DVT -Patient was placed on doxycycline, will continue through 3/5  Normocytic anemia -Hemoglobin baseline approximately 7-8 -No evidence of bleeding -Hemoglobin dropped to 6.6 today, patient was transfused 1 unit PRBC -Hemoglobin currently 8.2 after transfusion -Continue to monitor CBC  Essential hypertension BP currently stable off of medications, will continue to monitor  Hypothyroidism -Continue Synthroid  Acute metabolic encephalopathy/anxiety -Patient appears to be alert and oriented.  Suspect this is a resolved -Continue clonazepam, temazepam, venlafaxine  Right lower extremity pain -X-rays unremarkable -No edema or erythema on examination -Continue pain control with gabapentin, oxycodone  Nutritional needs -Patient has peg placed and currently on tube feeds  Elevated transaminases -Suspect secondary to sepsis -Resolved  DVT Prophylaxis  Eliquis  Code Status: Full  Family Communication: None at bedside  Disposition Plan:  Status is: Inpatient  Remains inpatient appropriate because:Unsafe d/c plan   Dispo: The patient is from: Home              Anticipated d/c is to: Home  Patient currently is not  medically stable to d/c.   Difficult to place patient No   Consultants PCCM  Procedures  Upper ext dopper  Antibiotics   Anti-infectives (From admission, onward)   Start     Dose/Rate Route Frequency Ordered Stop   06/19/20 1030  doxycycline (VIBRA-TABS) tablet 100 mg  Status:  Discontinued        100 mg Per Tube Every 12 hours 06/19/20 0942 06/21/20 0901   06/03/20 2000  vancomycin (VANCOREADY) IVPB 500 mg/100 mL  Status:  Discontinued        500 mg 100 mL/hr over 60 Minutes Intravenous Every 12 hours 06/03/20 0340 06/03/20 0827   06/03/20 0400  vancomycin (VANCOREADY) IVPB 1000 mg/200 mL        1,000 mg 200 mL/hr over 60 Minutes Intravenous  Once 06/03/20 0340 06/03/20 0528   06/03/20 0400  ceFEPIme (MAXIPIME) 2 g in sodium chloride 0.9 % 100 mL IVPB        2 g 200 mL/hr over 30 Minutes Intravenous Every 8 hours 06/03/20 0340 06/09/20 0630      Subjective:   Kathleen Cameron seen and examined today.  Patient feeling tired today and some shortness of breath. Denies current chest pain, abdominal pain, N/V, dizziness or headache. Complains of poor appetite.   Objective:   Vitals:   06/21/20 1100 06/21/20 1119 06/21/20 1134 06/21/20 1200  BP: 123/82   123/77  Pulse: (!) 110   (!) 108  Resp: 16   16  Temp:  99.4 F (37.4 C)    TempSrc:  Oral    SpO2: 100%  100% 100%  Weight:      Height:        Intake/Output Summary (Last 24 hours) at 06/21/2020 1444 Last data filed at 06/21/2020 1419 Gross per 24 hour  Intake 1085 ml  Output 1450 ml  Net -365 ml   Filed Weights   06/19/20 0500 06/20/20 0500 06/21/20 0500  Weight: 55.2 kg 56 kg 56 kg    Exam  General: Well developed, chronically ill-appearing, NAD  HEENT: NCAT,  mucous membranes moist.   Neck: Trach in place  Cardiovascular: S1 S2 auscultated, tachycardic, no murmur appreciated  Respiratory: Diminished breath sounds however clear  Abdomen: Soft, nontender, nondistended, + bowel sounds, peg in  place  Extremities: warm dry without cyanosis clubbing.  LE edema  Neuro: Awake and alert, moves all extremities  Psych: pleasant   Data Reviewed: I have personally reviewed following labs and imaging studies  CBC: Recent Labs  Lab 06/17/20 0114 06/18/20 0120 06/19/20 0249 06/21/20 0224 06/21/20 1328  WBC 7.0 7.5 8.0 8.9  --   HGB 7.1* 7.1* 7.2* 6.6* 8.2*  HCT 23.5* 23.6* 24.8* 22.3* 26.8*  MCV 94.4 93.3 95.4 94.9  --   PLT 244 264 279 268  --    Basic Metabolic Panel: Recent Labs  Lab 06/17/20 0114 06/19/20 0249 06/21/20 0224  NA 142 144 141  K 4.2 4.1 4.3  CL 100 101 98  CO2 34* 32 33*  GLUCOSE 111* 160* 149*  BUN 21* 16 23*  CREATININE 0.34* 0.35* 0.39*  CALCIUM 9.2 9.1 9.0  MG  --   --  1.8  PHOS  --   --  3.4   GFR: Estimated Creatinine Clearance: 67.8 mL/min (A) (by C-G formula based on SCr of 0.39 mg/dL (L)). Liver Function Tests: Recent Labs  Lab 06/17/20 0114  AST 40  ALT 46*  ALKPHOS 83  BILITOT 0.5  PROT 5.8*  ALBUMIN 2.4*   No results for input(s): LIPASE, AMYLASE in the last 168 hours. No results for input(s): AMMONIA in the last 168 hours. Coagulation Profile: No results for input(s): INR, PROTIME in the last 168 hours. Cardiac Enzymes: Recent Labs  Lab 06/18/20 1020  CKTOTAL 30*   BNP (last 3 results) No results for input(s): PROBNP in the last 8760 hours. HbA1C: Recent Labs    06/21/20 0224  HGBA1C 5.3   CBG: Recent Labs  Lab 06/20/20 2312 06/21/20 0048 06/21/20 0324 06/21/20 0724 06/21/20 1118  GLUCAP 183* 184* 140* 120* 124*   Lipid Profile: No results for input(s): CHOL, HDL, LDLCALC, TRIG, CHOLHDL, LDLDIRECT in the last 72 hours. Thyroid Function Tests: No results for input(s): TSH, T4TOTAL, FREET4, T3FREE, THYROIDAB in the last 72 hours. Anemia Panel: No results for input(s): VITAMINB12, FOLATE, FERRITIN, TIBC, IRON, RETICCTPCT in the last 72 hours. Urine analysis:    Component Value Date/Time    COLORURINE YELLOW 03/20/2020 0018   APPEARANCEUR HAZY (A) 03/20/2020 0018   LABSPEC 1.006 03/20/2020 0018   PHURINE 5.0 03/20/2020 0018   GLUCOSEU NEGATIVE 03/20/2020 0018   HGBUR NEGATIVE 03/20/2020 0018   BILIRUBINUR NEGATIVE 03/20/2020 0018   KETONESUR NEGATIVE 03/20/2020 0018   PROTEINUR NEGATIVE 03/20/2020 0018   UROBILINOGEN 1.0 11/12/2008 1440   NITRITE NEGATIVE 03/20/2020 0018   LEUKOCYTESUR NEGATIVE 03/20/2020 0018   Sepsis Labs: $[email or web address]'[id number]$ (procalcitonin:4,lacticidven:4)  )No results found for this or any previous visit (from the past 240 hour(s)).    Radiology Studies: No results found.   Scheduled Meds: . sodium chloride   Intravenous Once  . apixaban  5 mg Per Tube BID  . arformoterol  15 mcg Nebulization BID  . aspirin  81 mg Per Tube Daily  . budesonide  0.25 mg Nebulization BID  . chlorhexidine gluconate (MEDLINE KIT)  15 mL Mouth Rinse BID  . Chlorhexidine Gluconate Cloth  6 each Topical Q0600  . clonazePAM  1 mg Per Tube BID  . colchicine  0.6 mg Per Tube Daily  . feeding supplement  237 mL Oral TID BM  . feeding supplement (OSMOLITE 1.5 CAL)  237 mL Per Tube BID  . feeding supplement (PROSource TF)  45 mL Per Tube BID  . free water  160 mL Per Tube TID BM  . insulin aspart  0-9 Units Subcutaneous Q4H  . levothyroxine  50 mcg Per Tube Q0600  . mouth rinse  15 mL Mouth Rinse 10 times per day  . multivitamin with minerals  1 tablet Per Tube Daily  . pantoprazole sodium  40 mg Per Tube QHS  . revefenacin  175 mcg Nebulization Daily  . rosuvastatin  40 mg Per Tube Daily  . temazepam  15 mg Per Tube QHS  . venlafaxine  37.5 mg Per Tube BID WC   Continuous Infusions: . sodium chloride Stopped (06/11/20 1143)     LOS: 18 days   Time Spent in minutes   45 minutes  Berdell Hostetler D.O. on 06/21/2020 at 2:44 PM  Between 7am to 7pm - Please see pager noted on [email or web address]  After 7pm go to [email or web address]  And look for the night coverage person covering for  me after hours  [facility] Office  [phone]

## 2020-06-21 NOTE — Progress Notes (Signed)
eLink Physician-Brief Progress Note Patient Name: Kathleen Cameron DOB: 09-08-1965 MRN: [id number]   Date of Service  06/21/2020  HPI/Events of Note  Anemia - Hgb = 6.6. No obvious bleeding noted by bedside RN.  eICU Interventions  Will transfuse 1 unit PRBC now.     Intervention Category Major Interventions: Other:  Lenell Antu 06/21/2020, 3:59 AM

## 2020-06-21 NOTE — Progress Notes (Signed)
NAME:  Kathleen Cameron, MRN:  [id number], DOB:  07/15/1965, LOS: 18 ADMISSION DATE:  06/03/2020, CONSULTATION DATE:  2/12 REFERRING MD:  Pilar Plate, CHIEF COMPLAINT:  Dyspnea   Brief History:  55 year old female presented from home via EMS with complaints of acute respiratory distress after home ventilator failed.  [facility]  PCCM consulted for further management and admission. Diagnosed with HCAP and COPD exacerbation on admission.   Past Medical History:  Seizure disorder Hypertension Severe COPD Acute on chronic hypoxic and hypercapnic respiratory failure Anxiety  Significant Hospital Events:  Admitted 2/13, treated for hcap/copd exac 2/16 home vent 3/1 foot pain/elbow pain 3/3 podagra  Consults:  FEES 2/25 > without PMSV no aspiration, rec regular texture diet with thin liquids  Procedures:  Tracheostomy present prior to admission  Significant Diagnostic Tests:  2/23 Vas ultrasound upper extre> left subclavian DVT and superficial vein thrombosis 2/25 SLP  Micro Data:  2/13 sars cov 2/flu > negative 2/13 blood > staph hominis 1/4 2/13 resp > OPF 2/16 blood > No growth  Antimicrobials:  Cefepime 2/13 > 2/19 Vanc 2/13 x1  3/1 doxycycline  Interim History / Subjective:  Mag 1.8 on 3/3 Feet painful in 1st MTP bilaterally No labs 3/4 On home vent with good sats, in  NAD T Max 100 HGB 6.6 on 3/3 + 3 L , 2200 UO last 24 hours   Objective   Blood pressure 132/84, pulse (!) 118, temperature 99 F (37.2 C), resp. rate (!) 21, height 5' 4.02" (1.626 m), weight 56 kg, SpO2 100 %.    Vent Mode: AC FiO2 (%):  [40 %] 40 % Set Rate:  [16 bmp] 16 bmp Vt Set:  [480 mL] 480 mL PEEP:  [5 cmH20] 5 cmH20 Plateau Pressure:  [18 cmH20-22 cmH20] 20 cmH20   Intake/Output Summary (Last 24 hours) at 06/21/2020 6283 Last data filed at 06/21/2020 0300 Gross per 24 hour  Intake 310 ml  Output 600 ml  Net -290 ml   Filed Weights   06/19/20 0500  06/20/20 0500 06/21/20 0500  Weight: 55.2 kg 56 kg 56 kg    Examination:  General:  Resting comfortably in bed, on home vent HENT: NCAT OP clear trach in place, secure and intact PULM: CTA B, ventilator supported breathing, diminished per bases CV: RRR, no mgr GI: BS+, soft, nontender, ND, Body mass index is 21.18 kg/m. MSK: bilateral 1st MTP redness, tenderness, warmth Neuro: awake, alert, no distress, MAEW, following commands   Resolved Hospital Problem list     Assessment & Plan:  Acute on chronic hypoxemic and hypercapneic resp failure due to COPD Chronic respiratory failure with hypercarbia on home mechanical ventilation Full mechanical vent support VAP prevention Continue home ventilator Trend fever/ WBC  Provoked left subclavian DVT Eliquis 3 months  GOUT, new diagnosis 3/3 Start colchicine Stop doxycycline>> believe the elbow redness is gout and not cellulitis Trend Fever and WBC  HGB 6.6 on 3/3 Transfused 3/3 Plan Trend CBC Monitor for bleeding  Plan is for discharge to home Monday 3/7 with home health.    Best practice (evaluated daily)   Per [facility]  Goals of Care:   Per [facility]  Labs   CBC: Recent Labs  Lab 06/17/20 0114 06/18/20 0120 06/19/20 0249 06/21/20 0224  WBC 7.0 7.5 8.0 8.9  HGB 7.1* 7.1* 7.2* 6.6*  HCT 23.5* 23.6* 24.8* 22.3*  MCV 94.4 93.3 95.4 94.9  PLT 244 264 279 268    Basic Metabolic Panel:  Recent Labs  Lab 06/17/20 0114 06/19/20 0249 06/21/20 0224  NA 142 144 141  K 4.2 4.1 4.3  CL 100 101 98  CO2 34* 32 33*  GLUCOSE 111* 160* 149*  BUN 21* 16 23*  CREATININE 0.34* 0.35* 0.39*  CALCIUM 9.2 9.1 9.0  MG  --   --  1.8  PHOS  --   --  3.4   GFR: Estimated Creatinine Clearance: 67.8 mL/min (A) (by C-G formula based on SCr of 0.39 mg/dL (L)). Recent Labs  Lab 06/17/20 0114 06/18/20 0120 06/19/20 0249 06/21/20 0224  WBC 7.0 7.5 8.0 8.9    Liver Function Tests: Recent Labs  Lab 06/17/20 0114  AST 40  ALT  46*  ALKPHOS 83  BILITOT 0.5  PROT 5.8*  ALBUMIN 2.4*   No results for input(s): LIPASE, AMYLASE in the last 168 hours. No results for input(s): AMMONIA in the last 168 hours.  ABG    Component Value Date/Time   PHART 7.495 (H) 05/19/2020 1055   PCO2ART 47.1 05/19/2020 1055   PO2ART 155 (H) 05/19/2020 1055   HCO3 33.0 (H) 06/03/2020 0052   TCO2 35 (H) 06/03/2020 0052   O2SAT 99.0 06/03/2020 0052     Coagulation Profile: No results for input(s): INR, PROTIME in the last 168 hours.  Cardiac Enzymes: Recent Labs  Lab 06/18/20 1020  CKTOTAL 30*    HbA1C: Hgb A1c MFr Bld  Date/Time Value Ref Range Status  06/21/2020 02:24 AM 5.3 4.8 - 5.6 % Final    Comment:    (NOTE) Pre diabetes:          5.7%-6.4%  Diabetes:              >6.4%  Glycemic control for   <7.0% adults with diabetes   02/26/2020 05:18 PM 5.4 4.8 - 5.6 % Final    Comment:    (NOTE) Pre diabetes:          5.7%-6.4%  Diabetes:              >6.4%  Glycemic control for   <7.0% adults with diabetes     CBG: Recent Labs  Lab 06/20/20 1922 06/20/20 2312 06/21/20 0048 06/21/20 0324 06/21/20 0724  GLUCAP 104* 183* 184* 140* 120*     Critical care time: n/a    Bevelyn Ngo, MSN, AGACNP-BC [facility] See Amion for personal pager PCCM on call pager [phone] 06/22/2020 11:20 AM

## 2020-06-22 ENCOUNTER — Encounter (HOSPITAL_COMMUNITY): Payer: Self-pay

## 2020-06-22 DIAGNOSIS — J9621 Acute and chronic respiratory failure with hypoxia: Secondary | ICD-10-CM | POA: Diagnosis not present

## 2020-06-22 DIAGNOSIS — J189 Pneumonia, unspecified organism: Secondary | ICD-10-CM | POA: Diagnosis not present

## 2020-06-22 DIAGNOSIS — J449 Chronic obstructive pulmonary disease, unspecified: Secondary | ICD-10-CM | POA: Diagnosis not present

## 2020-06-22 DIAGNOSIS — J9622 Acute and chronic respiratory failure with hypercapnia: Secondary | ICD-10-CM | POA: Diagnosis not present

## 2020-06-22 DIAGNOSIS — J9601 Acute respiratory failure with hypoxia: Secondary | ICD-10-CM | POA: Diagnosis not present

## 2020-06-22 LAB — BPAM RBC
Blood Product Expiration Date: 202204012359
ISSUE DATE / TIME: 202203030801
Unit Type and Rh: 5100

## 2020-06-22 LAB — CBC
HCT: 26 % — ABNORMAL LOW (ref 36.0–46.0)
Hemoglobin: 8.3 g/dL — ABNORMAL LOW (ref 12.0–15.0)
MCH: 29 pg (ref 26.0–34.0)
MCHC: 31.9 g/dL (ref 30.0–36.0)
MCV: 90.9 fL (ref 80.0–100.0)
Platelets: 247 10*3/uL (ref 150–400)
RBC: 2.86 MIL/uL — ABNORMAL LOW (ref 3.87–5.11)
RDW: 17.7 % — ABNORMAL HIGH (ref 11.5–15.5)
WBC: 8.4 10*3/uL (ref 4.0–10.5)
nRBC: 0.2 % (ref 0.0–0.2)

## 2020-06-22 LAB — BASIC METABOLIC PANEL
Anion gap: 8 (ref 5–15)
BUN: 26 mg/dL — ABNORMAL HIGH (ref 6–20)
CO2: 37 mmol/L — ABNORMAL HIGH (ref 22–32)
Calcium: 9.2 mg/dL (ref 8.9–10.3)
Chloride: 97 mmol/L — ABNORMAL LOW (ref 98–111)
Creatinine, Ser: 0.35 mg/dL — ABNORMAL LOW (ref 0.44–1.00)
GFR, Estimated: >60 mL/min (ref >60)
Glucose, Bld: 101 mg/dL — ABNORMAL HIGH (ref 70–99)
Potassium: 3.9 mmol/L (ref 3.5–5.1)
Sodium: 142 mmol/L (ref 135–145)

## 2020-06-22 LAB — TYPE AND SCREEN
ABO/RH(D): O POS
Antibody Screen: NEGATIVE
Unit division: 0

## 2020-06-22 LAB — GLUCOSE, CAPILLARY
Glucose-Capillary: 100 mg/dL — ABNORMAL HIGH (ref 70–99)
Glucose-Capillary: 87 mg/dL (ref 70–99)
Glucose-Capillary: 96 mg/dL (ref 70–99)
Glucose-Capillary: 77 mg/dL (ref 70–99)
Glucose-Capillary: 101 mg/dL — ABNORMAL HIGH (ref 70–99)
Glucose-Capillary: 107 mg/dL — ABNORMAL HIGH (ref 70–99)

## 2020-06-22 LAB — HEMOGLOBIN AND HEMATOCRIT, BLOOD
HCT: 23.8 % — ABNORMAL LOW (ref 36.0–46.0)
Hemoglobin: 7.4 g/dL — ABNORMAL LOW (ref 12.0–15.0)

## 2020-06-22 MED ORDER — APIXABAN 5 MG PO TABS
5.0000 mg | ORAL_TABLET | Freq: Two times a day (BID) | ORAL | Status: DC
  Administered 2020-06-22 – 2020-06-24 (×5): 5 mg via ORAL
  Filled 2020-06-22 (×5): qty 1

## 2020-06-22 NOTE — Progress Notes (Signed)
PROGRESS NOTE    Kathleen Cameron  ALP:[id number] DOB: 1966/01/25 DOA: 06/03/2020 PCP: Horald Pollen, MD   Brief Narrative:  HPI On 06/03/2020 by Ms. Merlene Laughter, NP (PCCM) Kathleen Cameron is a 55 y.o. with a past medical history significant for seizure disorder, hypertension, severe COPD, acute on chronic hypoxic and hypercapnic respiratory failure, and anxiety who presented to the ED via EMS with acute on chronic hypoxic and hypercapnic respiratory failure.  Of note [facility] to [facility].  Primary history obtained from chart review as patient is currently on ventilator through chronic trach.  It appears patient was treated at this facility November 2021 for acute on chronic hypoxic respiratory failure she required endotracheal intubation and eventual tracheostomy due to difficulty weaning 03/02/2020.  On 11/18 [facility]  Upon discharge home per chart review it appears patient's home ventilator malfunction much with resulted in respiratory failure and need for bag mask ventilation.  On arrival to ED patient was placed on ventilator with home vent settings with no further acute respiratory compromise.  PCCM was called for further management and admission.   Interim history Patient was admitted with acute respiratory distress after home ventilator failed.  She is admitted with acute on chronic hypoxic and hypercapnic respiratory failure, COPD.  Patient was also found to have a left subclavian DVT, and is since placed on Eliquis.  She continues to need ventilator assistance.  Plan is for home with home health on 06/25/2020. Assessment & Plan   Acute on chronic hypoxic and hypercapnic respiratory failure secondary to COPD exacerbation -Currently requiring home mechanical ventilation.  Patient was initially admitted in November 2021 for respiratory failure and required intubation at that time was  unable to wean and had tracheostomy placed.  [facility] and then was discharged home in February 2022. -Currently on full ventilator support -Patient was placed on prednisone for 5 days -Continue Brovana, Pulmicort, Yupelri nebs  Severe sepsis -Present on admission -Suspect secondary to pneumonia- VAP vs HAP -Patient was placed on antibiotics and completed 7 days -Blood cultures showed staph hominis bacteremia which was presumed to be a contamination-patient was placed on Vancomycin and cefepime  Provoked left subclavian DVT/edema -Patient currently on Eliquis, will require this for 3 months through 09/09/2020 -Patient was noted to have low-grade fevers or tachycardia -Doppler was done on 06/12/20 showing left subclavian DVT -Patient was placed on doxycycline, will continue through 3/5  Normocytic anemia -Hemoglobin baseline approximately 7-8 -No evidence of bleeding -Hemoglobin dropped to 6.6 today, patient was transfused 1 unit PRBC -Hemoglobin currently 8.3 today -Continue to monitor CBC  Essential hypertension BP currently stable off of medications, will continue to monitor  Hypothyroidism -Continue Synthroid  Acute metabolic encephalopathy/anxiety -Patient appears to be alert and oriented.  Suspect this is a resolved -Continue clonazepam, temazepam, venlafaxine  Right lower extremity pain -X-rays unremarkable -No edema or erythema on examination -Continue pain control with gabapentin, oxycodone  Nutritional needs -Patient has peg placed and currently on tube feeds  Elevated transaminases -Suspect secondary to sepsis -Resolved  DVT Prophylaxis  Eliquis  Code Status: Full  Family Communication: None at bedside  Disposition Plan:  Status is: Inpatient  Remains inpatient appropriate because:Unsafe d/c plan. Needs home health which has been arranged for 3/7.   Dispo: The patient is from: Home              Anticipated d/c is to:  Home              Patient currently is not medically stable to d/c.   Difficult to place patient No   Consultants PCCM  Procedures  Upper ext dopper  Antibiotics   Anti-infectives (From admission, onward)   Start     Dose/Rate Route Frequency Ordered Stop   06/19/20 1030  doxycycline (VIBRA-TABS) tablet 100 mg  Status:  Discontinued        100 mg Per Tube Every 12 hours 06/19/20 0942 06/21/20 0901   06/03/20 2000  vancomycin (VANCOREADY) IVPB 500 mg/100 mL  Status:  Discontinued        500 mg 100 mL/hr over 60 Minutes Intravenous Every 12 hours 06/03/20 0340 06/03/20 0827   06/03/20 0400  vancomycin (VANCOREADY) IVPB 1000 mg/200 mL        1,000 mg 200 mL/hr over 60 Minutes Intravenous  Once 06/03/20 0340 06/03/20 0528   06/03/20 0400  ceFEPIme (MAXIPIME) 2 g in sodium chloride 0.9 % 100 mL IVPB        2 g 200 mL/hr over 30 Minutes Intravenous Every 8 hours 06/03/20 0340 06/09/20 0630      Subjective:   Kathleen Cameron seen and examined today.  Patient states she is feeling better today.  Still has poor appetite and gets full very quickly.  Denies current chest pain, abdominal pain, nausea or vomiting, dizziness or headache.   Objective:   Vitals:   06/22/20 1100 06/22/20 1144 06/22/20 1200 06/22/20 1220  BP: (!) 129/91   (!) 132/92  Pulse: (!) 119  (!) 117 (!) 115  Resp:   (!) 22 (!) 22  Temp:  97.8 F (36.6 C)    TempSrc:  Oral    SpO2:   98% 97%  Weight:      Height:        Intake/Output Summary (Last 24 hours) at 06/22/2020 1355 Last data filed at 06/22/2020 1234 Gross per 24 hour  Intake --  Output 2290 ml  Net -2290 ml   Filed Weights   06/19/20 0500 06/20/20 0500 06/21/20 0500  Weight: 55.2 kg 56 kg 56 kg   Exam  General: Well developed, chronically ill-appearing, NAD  HEENT: NCAT, mucous membranes moist.   Neck: Trach  Cardiovascular: S1 S2 auscultated, tachycardic, no murmur appreciated  Respiratory: Diminished breath sounds  Abdomen: Soft,  nontender, nondistended, + bowel sounds, PEG in place  Extremities: warm dry without cyanosis clubbing.  LE edema bilaterally  Neuro: Awake and alert, moves all extremities and answers questions appropriately  Psych: pleasant, appropriate mood and affect  Data Reviewed: I have personally reviewed following labs and imaging studies  CBC: Recent Labs  Lab 06/17/20 0114 06/18/20 0120 06/19/20 0249 06/21/20 0224 06/21/20 1328 06/22/20 0223 06/22/20 1146  WBC 7.0 7.5 8.0 8.9  --   --  8.4  HGB 7.1* 7.1* 7.2* 6.6* 8.2* 7.4* 8.3*  HCT 23.5* 23.6* 24.8* 22.3* 26.8* 23.8* 26.0*  MCV 94.4 93.3 95.4 94.9  --   --  90.9  PLT 244 264 279 268  --   --  527   Basic Metabolic Panel: Recent Labs  Lab 06/17/20 0114 06/19/20 0249 06/21/20 0224 06/22/20 1146  NA 142 144 141 142  K 4.2 4.1 4.3 3.9  CL 100 101 98 97*  CO2 34* 32 33* 37*  GLUCOSE 111* 160* 149* 101*  BUN 21* 16 23* 26*  CREATININE 0.34* 0.35* 0.39* 0.35*  CALCIUM 9.2 9.1 9.0 9.2  MG  --   --  1.8  --   PHOS  --   --  3.4  --    GFR: Estimated Creatinine Clearance: 67.8 mL/min (A) (by C-G formula based on SCr of 0.35 mg/dL (L)). Liver Function Tests: Recent Labs  Lab 06/17/20 0114  AST 40  ALT 46*  ALKPHOS 83  BILITOT 0.5  PROT 5.8*  ALBUMIN 2.4*   No results for input(s): LIPASE, AMYLASE in the last 168 hours. No results for input(s): AMMONIA in the last 168 hours. Coagulation Profile: No results for input(s): INR, PROTIME in the last 168 hours. Cardiac Enzymes: Recent Labs  Lab 06/18/20 1020  CKTOTAL 30*   BNP (last 3 results) No results for input(s): PROBNP in the last 8760 hours. HbA1C: Recent Labs    06/21/20 0224  HGBA1C 5.3   CBG: Recent Labs  Lab 06/21/20 1918 06/21/20 2315 06/22/20 0314 06/22/20 0746 06/22/20 1143  GLUCAP 111* 126* 100* 87 96   Lipid Profile: No results for input(s): CHOL, HDL, LDLCALC, TRIG, CHOLHDL, LDLDIRECT in the last 72 hours. Thyroid Function Tests: No  results for input(s): TSH, T4TOTAL, FREET4, T3FREE, THYROIDAB in the last 72 hours. Anemia Panel: No results for input(s): VITAMINB12, FOLATE, FERRITIN, TIBC, IRON, RETICCTPCT in the last 72 hours. Urine analysis:    Component Value Date/Time   COLORURINE YELLOW 03/20/2020 0018   APPEARANCEUR HAZY (A) 03/20/2020 0018   LABSPEC 1.006 03/20/2020 0018   PHURINE 5.0 03/20/2020 0018   GLUCOSEU NEGATIVE 03/20/2020 0018   HGBUR NEGATIVE 03/20/2020 0018   BILIRUBINUR NEGATIVE 03/20/2020 0018   KETONESUR NEGATIVE 03/20/2020 0018   PROTEINUR NEGATIVE 03/20/2020 0018   UROBILINOGEN 1.0 11/12/2008 1440   NITRITE NEGATIVE 03/20/2020 0018   LEUKOCYTESUR NEGATIVE 03/20/2020 0018   Sepsis Labs: _0 (procalcitonin:4,lacticidven:4)  )No results found for this or any previous visit (from the past 240 hour(s)).    Radiology Studies: No results found.   Scheduled Meds: . sodium chloride   Intravenous Once  . apixaban  5 mg Per Tube BID  . arformoterol  15 mcg Nebulization BID  . aspirin  81 mg Per Tube Daily  . budesonide  0.25 mg Nebulization BID  . chlorhexidine gluconate (MEDLINE KIT)  15 mL Mouth Rinse BID  . Chlorhexidine Gluconate Cloth  6 each Topical Q0600  . clonazePAM  1 mg Per Tube BID  . colchicine  0.6 mg Per Tube Daily  . feeding supplement  237 mL Oral TID BM  . feeding supplement (OSMOLITE 1.5 CAL)  237 mL Per Tube BID  . feeding supplement (PROSource TF)  45 mL Per Tube BID  . free water  160 mL Per Tube TID BM  . insulin aspart  0-9 Units Subcutaneous Q4H  . levothyroxine  50 mcg Per Tube Q0600  . mouth rinse  15 mL Mouth Rinse 10 times per day  . multivitamin with minerals  1 tablet Per Tube Daily  . pantoprazole sodium  40 mg Per Tube QHS  . revefenacin  175 mcg Nebulization Daily  . rosuvastatin  40 mg Per Tube Daily  . temazepam  15 mg Per Tube QHS  . venlafaxine  37.5 mg Per Tube BID WC   Continuous Infusions: . sodium chloride Stopped (06/11/20 1143)      LOS: 19 days   Time Spent in minutes   30 minutes  Zakar Brosch D.O. on 06/22/2020 at 1:55 PM  Between 7am to 7pm - Please see pager noted on [email or web address]  After  7pm go to [email or web address]  And look for the night coverage person covering for me after hours  [facility] Office  [phone]

## 2020-06-22 NOTE — Progress Notes (Signed)
Occupational Therapy Treatment Patient Details Name: Kathleen Cameron MRN: [id number] DOB: 27-Dec-1965 Today's Date: 06/22/2020    History of present illness 55 y.o. female admitted 2/13 from home with respiratory distress due to home vent malfunction after same day D/C from [facility] with home vent. PMHx pt with [facility] admission 11/6 with trach 03/02/20 ant transition to [facility] 11/18-2/12, 04/25/19 Gtube placed, COPD, hypertension, depression/anxiety, HLD, shingles infection,   OT comments  Treatment focused on functional mobility, activity tolerance, strengthening and feeding task. Patient provided with active assist for shoulder flexion bilaterally and wrist pumps to prepare for active movement. Patient provided with verbal and tactile cues for side lying to sit x 2 (min assist) to work on patient using upper extremities and lower extremities for transfer. Patient sat for an extended amount of time at edge of bed approx 30 min to work on sitting balance - slight movements of trunk including slight flexion and lateral sways with music. Patient able to perform towel slides on tabletop with each arm and bring food to mouth x 3 with built up handle and x 2 with handled cup. On return to bed patient mod assist for LEs but patient assisting with trunk movement and upper extremities. Patient found to have BM so patient rolled left and right for pericare - min assist for tactile cues and patient using bed rails. Patient and husband provided with instruction on exercises/activites to perform with squeeze ball provided and orange theraband as well as ideas for activities patient could perform at home - including folding clothes. Patient exhibited mild distress at end of treatment - though vital signs stable - requiring Rn to suction - otherwise patient tolerated activity well.      Follow Up Recommendations  Home health OT;Supervision/Assistance - 24 hour;Other (comment)    Equipment Recommendations  None  recommended by OT    Recommendations for Other Services      Precautions / Restrictions Precautions Precautions: Fall;Other (comment) Precaution Comments: trach, on home vent Restrictions Weight Bearing Restrictions: No       Mobility Bed Mobility Overal bed mobility: Needs Assistance Bed Mobility: Rolling;Sidelying to Sit;Sit to Sidelying Rolling: Min assist Sidelying to sit: Min assist;HOB elevated     Sit to sidelying: Mod assist General bed mobility comments: Min assist for tactile cues with verbal cues to turn head then body for rolling left and right and use of bed rails. Patient min assist for sidelying to sit - once again with verbal cues for technique and tactile cues for movement of body. On return to bed patient required mod assist for LEs.    Transfers                      Balance Overall balance assessment: Needs assistance Sitting-balance support: Feet unsupported;No upper extremity supported Sitting balance-Leahy Scale: Fair Sitting balance - Comments: Extended amount of time at edge of bed - min guard for safety                                   ADL either performed or assessed with clinical judgement   ADL Overall ADL's : Needs assistance/impaired Eating/Feeding: Set up Eating/Feeding Details (indicate cue type and reason): Patient seated at edge of bed. Provided with built up handle. Performed self feeding with built up spoon x 3. Increased time to chew and swallow. Provided cup with handle to assist with patient to hold  and lift cup. Patient able to bring cup to mouth x 2 to drink from straw.                                         Vision Patient Visual Report: No change from baseline     Perception     Praxis      Cognition Arousal/Alertness: Awake/alert Behavior During Therapy: WFL for tasks assessed/performed Overall Cognitive Status: Within Functional Limits for tasks assessed                                  General Comments: pt mouthing words, nodding and following commands. Trach and difficult to read lips at times        Exercises Other Exercises Other Exercises: seated edge of bed unsupported for overall trunk strengthening and endurance - patient able to move head and sway trunk to music without loss of balance Other Exercises: In supine BUE AAROM shoulder flexion and active wrist pumps to prepare for functional movement/activity Other Exercises: Patient performed towel slides forward on table top and at an angle with each arm with some activation of  trunk flexion Other Exercises: Patient provided with squeeze ball and orange theraband and demonstrated activities/exercises for patient to perform while in bed. Recommended bed exercises for strengthening. recommended activity at edge of bed including a meal.   Shoulder Instructions       General Comments      Pertinent Vitals/ Pain       Pain Assessment: Faces Faces Pain Scale: Hurts little more Pain Location: Bil feet Pain Descriptors / Indicators: Grimacing;Guarding Pain Intervention(s): Limited activity within patient's tolerance  Home Living                                          Prior Functioning/Environment              Frequency  Min 2X/week        Progress Toward Goals  OT Goals(current goals can now be found in the care plan section)  Progress towards OT goals: Progressing toward goals  Acute Rehab OT Goals Patient Stated Goal: agreeable to strengthening and progressive activity OT Goal Formulation: With patient/family Time For Goal Achievement: 06/29/20 Potential to Achieve Goals: Fair  Plan Discharge plan remains appropriate    Co-evaluation                 AM-PAC OT "6 Clicks" Daily Activity     Outcome Measure   Help from another person eating meals?: A Little Help from another person taking care of personal grooming?: A Little Help from  another person toileting, which includes using toliet, bedpan, or urinal?: Total Help from another person bathing (including washing, rinsing, drying)?: A Lot Help from another person to put on and taking off regular upper body clothing?: Total Help from another person to put on and taking off regular lower body clothing?: Total 6 Click Score: 11    End of Session    OT Visit Diagnosis: Muscle weakness (generalized) (M62.81);Other abnormalities of gait and mobility (R26.89);Pain Pain - part of body: Ankle and joints of foot   Activity Tolerance Patient tolerated treatment well   Patient Left in bed;with call  bell/phone within reach;with family/visitor present   Nurse Communication Mobility status        Time: 1410-1500 OT Time Calculation (min): 50 min  Charges: OT General Charges $OT Visit: 1 Visit OT Treatments $Self Care/Home Management : 8-22 mins $Therapeutic Activity: 23-37 mins  Breton Berns, OTR/L Acute Care Rehab Services  Office [phone] Pager: [phone]    Kelli Churn 06/22/2020, 4:28 PM

## 2020-06-22 NOTE — TOC Progression Note (Addendum)
Transition of Care [facility] - Progression Note    Patient Details  Name: Kathleen Cameron MRN: [id number] Date of Birth: 1966/04/12  Transition of Care [facility]) CM/SW Contact  Epifanio Lesches, RN Phone Number: 06/22/2020, 8:34 AM  Clinical Narrative:    NCM f/u with [facility] [phone]) for update regarding their availability to start home health services. Voice message left. Awaiting call back...  TOC team will continue to monitor and assist with needs....  06/22/2020 @ 1610  NCM received a call from [place] [facility] Archie Patten stated agency has confirmed nursing for Tuesday and Friday of next week ... office still working on staffing needs for pt. TOC team to f/u on Monday.  Expected Discharge Plan: Home w Home Health Services Barriers to Discharge: Other (comment)  Expected Discharge Plan and Services Expected Discharge Plan: Home w Home Health Services   Discharge Planning Services: CM Consult Post Acute Care Choice: Home Health,Durable Medical Equipment Living arrangements for the past 2 months: Apartment                                       Social Determinants of Health (SDOH) Interventions    Readmission Risk Interventions Readmission Risk Prevention Plan 02/02/2020  Transportation Screening Complete  PCP or Specialist Appt within 3-5 Days Complete  HRI or Home Care Consult Complete  Social Work Consult for Recovery Care Planning/Counseling Complete  Palliative Care Screening Not Applicable  Medication Review Oceanographer) Complete  Some recent data might be hidden

## 2020-06-23 ENCOUNTER — Inpatient Hospital Stay (HOSPITAL_COMMUNITY): Payer: No Typology Code available for payment source

## 2020-06-23 DIAGNOSIS — J9622 Acute and chronic respiratory failure with hypercapnia: Secondary | ICD-10-CM | POA: Diagnosis not present

## 2020-06-23 DIAGNOSIS — J9601 Acute respiratory failure with hypoxia: Secondary | ICD-10-CM | POA: Diagnosis not present

## 2020-06-23 DIAGNOSIS — J449 Chronic obstructive pulmonary disease, unspecified: Secondary | ICD-10-CM | POA: Diagnosis not present

## 2020-06-23 DIAGNOSIS — J9621 Acute and chronic respiratory failure with hypoxia: Secondary | ICD-10-CM | POA: Diagnosis not present

## 2020-06-23 LAB — BASIC METABOLIC PANEL
Anion gap: 11 (ref 5–15)
BUN: 23 mg/dL — ABNORMAL HIGH (ref 6–20)
CO2: 32 mmol/L (ref 22–32)
Calcium: 9.1 mg/dL (ref 8.9–10.3)
Chloride: 97 mmol/L — ABNORMAL LOW (ref 98–111)
Creatinine, Ser: 0.32 mg/dL — ABNORMAL LOW (ref 0.44–1.00)
GFR, Estimated: >60 mL/min (ref >60)
Glucose, Bld: 79 mg/dL (ref 70–99)
Potassium: 3.7 mmol/L (ref 3.5–5.1)
Sodium: 140 mmol/L (ref 135–145)

## 2020-06-23 LAB — HEMOGLOBIN AND HEMATOCRIT, BLOOD
HCT: 23.9 % — ABNORMAL LOW (ref 36.0–46.0)
Hemoglobin: 7.6 g/dL — ABNORMAL LOW (ref 12.0–15.0)

## 2020-06-23 LAB — GLUCOSE, CAPILLARY
Glucose-Capillary: 80 mg/dL (ref 70–99)
Glucose-Capillary: 85 mg/dL (ref 70–99)
Glucose-Capillary: 87 mg/dL (ref 70–99)
Glucose-Capillary: 88 mg/dL (ref 70–99)
Glucose-Capillary: 123 mg/dL — ABNORMAL HIGH (ref 70–99)

## 2020-06-23 LAB — MAGNESIUM: Magnesium: 1.8 mg/dL (ref 1.7–2.4)

## 2020-06-23 NOTE — Progress Notes (Signed)
PROGRESS NOTE    Kathleen Cameron  YBW:[id number] DOB: [birth year] DOA: 06/03/2020 PCP: Horald Pollen, MD   Brief Narrative:  HPI On 06/03/2020 by Ms. Merlene Laughter, NP (PCCM) Kathleen Cameron is a 55 y.o. with a past medical history significant for seizure disorder, hypertension, severe COPD, acute on chronic hypoxic and hypercapnic respiratory failure, and anxiety who presented to the ED via EMS with acute on chronic hypoxic and hypercapnic respiratory failure.  [facility]  Primary history obtained from chart review as patient is currently on ventilator through chronic trach.  It appears patient was treated at this facility November 2021 for acute on chronic hypoxic respiratory failure she required endotracheal intubation and eventual tracheostomy due to difficulty weaning 03/02/2020.  On 11[facility]  Upon discharge home per chart review it appears patient's home ventilator malfunction much with resulted in respiratory failure and need for bag mask ventilation.  On arrival to ED patient was placed on ventilator with home vent settings with no further acute respiratory compromise.  PCCM was called for further management and admission.   Interim history Patient was admitted with acute respiratory distress after home ventilator failed.  She is admitted with acute on chronic hypoxic and hypercapnic respiratory failure, COPD.  Patient was also found to have a left subclavian DVT, and is since placed on Eliquis.  She continues to need ventilator assistance.  Plan is for home with home health on 06/25/2020. Assessment & Plan   Acute on chronic hypoxic and hypercapnic respiratory failure secondary to COPD exacerbation -Currently requiring home mechanical ventilation.  Patient was initially admitted in November 2021 for respiratory failure and required intubation at that time was  unable to wean and had tracheostomy placed.  [facility] then was discharged [facility] in February 2022. -Currently on full ventilator support -Patient was placed on prednisone for 5 days -Continue Brovana, Pulmicort, Yupelri nebs  Severe sepsis -Present on admission -Suspect secondary to pneumonia- VAP vs HAP -Patient was placed on antibiotics and completed 7 days -Blood cultures showed staph hominis bacteremia which was presumed to be a contamination-patient was placed on Vancomycin and cefepime  Provoked left subclavian DVT/edema -Patient currently on Eliquis, will require this for 3 months through 09/09/2020 -Patient was noted to have low-grade fevers or tachycardia -Doppler was done on 06/12/20 showing left subclavian DVT -Patient was placed on doxycycline, will discontinue after today's doses  Normocytic anemia -Hemoglobin baseline approximately 7-8 -No evidence of bleeding -Hemoglobin dropped to 6.6 today, patient was transfused 1 unit PRBC -Hemoglobin currently 7.6 today -Continue to monitor CBC  Essential hypertension -BP currently stable off of medications, will continue to monitor  Hypothyroidism -Continue Synthroid  Acute metabolic encephalopathy/anxiety -Patient appears to be alert and oriented.  Suspect this is a resolved -Continue clonazepam, temazepam, venlafaxine  Right lower extremity pain -X-rays unremarkable -No edema or erythema on examination -Continue pain control with gabapentin, oxycodone  Nutritional needs -Patient has peg placed and currently on tube feeds  Elevated transaminases -Suspect secondary to sepsis -Resolved  DVT Prophylaxis  Eliquis  Code Status: Full  Family Communication: None at bedside  Disposition Plan:  Status is: Inpatient  Remains inpatient appropriate because:Unsafe d/c plan. Needs home health which has been arranged for 3/7.   Dispo: The patient is from: Home              Anticipated d/c  is  to: Home              Patient currently is not medically stable to d/c.   Difficult to place patient No   Consultants PCCM  Procedures  Upper ext dopper  Antibiotics   Anti-infectives (From admission, onward)   Start     Dose/Rate Route Frequency Ordered Stop   06/19/20 1030  doxycycline (VIBRA-TABS) tablet 100 mg  Status:  Discontinued        100 mg Per Tube Every 12 hours 06/19/20 0942 06/21/20 0901   06/03/20 2000  vancomycin (VANCOREADY) IVPB 500 mg/100 mL  Status:  Discontinued        500 mg 100 mL/hr over 60 Minutes Intravenous Every 12 hours 06/03/20 0340 06/03/20 0827   06/03/20 0400  vancomycin (VANCOREADY) IVPB 1000 mg/200 mL        1,000 mg 200 mL/hr over 60 Minutes Intravenous  Once 06/03/20 0340 06/03/20 0528   06/03/20 0400  ceFEPIme (MAXIPIME) 2 g in sodium chloride 0.9 % 100 mL IVPB        2 g 200 mL/hr over 30 Minutes Intravenous Every 8 hours 06/03/20 0340 06/09/20 0630      Subjective:   [name] seen and examined today.  Patient continues to have little appetite and can only nipple and a few bites before getting full very quickly.  Denies current chest pain or shortness of breath, abdominal pain, nausea or vomiting, dizziness or headache.  Looking forward to going home soon.    Objective:   Vitals:   06/23/20 0600 06/23/20 0700 06/23/20 0800 06/23/20 1138  BP: 109/83 109/86 (!) 132/92   Pulse: (!) 106 (!) 104 (!) 110   Resp: 16 16 (!) 21   Temp:   100 F (37.8 C) 97.6 F (36.4 C)  TempSrc:   Oral Oral  SpO2: 95%  100%   Weight:      Height:        Intake/Output Summary (Last 24 hours) at 06/23/2020 1154 Last data filed at 06/23/2020 1018 Gross per 24 hour  Intake 50 ml  Output 346 ml  Net -296 ml   Filed Weights   06/20/20 0500 06/21/20 0500 06/23/20 0500  Weight: 56 kg 56 kg 57.2 kg  Exam  General: Well developed, chronically ill-appearing, NAD  HEENT: NCAT, mucous membranes moist.   Neck: Trach, currently on  vent  Cardiovascular: S1 S2 auscultated, tachycardic, no murmur appreciated  Respiratory: Clear to auscultation bilaterally with equal chest rise  Abdomen: Soft, nontender, nondistended, + bowel sounds, PEG in place  Extremities: warm dry without cyanosis clubbing.  LE edema bilaterally  Neuro: AAOx3, moves all extremities and answers questions appropriately  Psych: appropriate mood and affect, pleasant  Data Reviewed: I have personally reviewed following labs and imaging studies  CBC: Recent Labs  Lab 06/17/20 0114 06/18/20 0120 06/19/20 0249 06/21/20 0224 06/21/20 1328 06/22/20 0223 06/22/20 1146 06/23/20 0233  WBC 7.0 7.5 8.0 8.9  --   --  8.4  --   HGB 7.1* 7.1* 7.2* 6.6* 8.2* 7.4* 8.3* 7.6*  HCT 23.5* 23.6* 24.8* 22.3* 26.8* 23.8* 26.0* 23.9*  MCV 94.4 93.3 95.4 94.9  --   --  90.9  --   PLT 244 264 279 268  --   --  247  --    Basic Metabolic Panel: Recent Labs  Lab 06/17/20 0114 06/19/20 0249 06/21/20 0224 06/22/20 1146 06/23/20 0233  NA 142 144 141 142 140  K 4.2 4.1  4.3 3.9 3.7  CL 100 101 98 97* 97*  CO2 34* 32 33* 37* 32  GLUCOSE 111* 160* 149* 101* 79  BUN 21* 16 23* 26* 23*  CREATININE 0.34* 0.35* 0.39* 0.35* 0.32*  CALCIUM 9.2 9.1 9.0 9.2 9.1  MG  --   --  1.8  --  1.8  PHOS  --   --  3.4  --   --    GFR: Estimated Creatinine Clearance: 67.8 mL/min (A) (by C-G formula based on SCr of 0.32 mg/dL (L)). Liver Function Tests: Recent Labs  Lab 06/17/20 0114  AST 40  ALT 46*  ALKPHOS 83  BILITOT 0.5  PROT 5.8*  ALBUMIN 2.4*   No results for input(s): LIPASE, AMYLASE in the last 168 hours. No results for input(s): AMMONIA in the last 168 hours. Coagulation Profile: No results for input(s): INR, PROTIME in the last 168 hours. Cardiac Enzymes: Recent Labs  Lab 06/18/20 1020  CKTOTAL 30*   BNP (last 3 results) No results for input(s): PROBNP in the last 8760 hours. HbA1C: Recent Labs    06/21/20 0224  HGBA1C 5.3   CBG: Recent  Labs  Lab 06/22/20 1525 06/22/20 1928 06/22/20 2315 06/23/20 0320 06/23/20 0733  GLUCAP 77 101* 107* 80 85   Lipid Profile: No results for input(s): CHOL, HDL, LDLCALC, TRIG, CHOLHDL, LDLDIRECT in the last 72 hours. Thyroid Function Tests: No results for input(s): TSH, T4TOTAL, FREET4, T3FREE, THYROIDAB in the last 72 hours. Anemia Panel: No results for input(s): VITAMINB12, FOLATE, FERRITIN, TIBC, IRON, RETICCTPCT in the last 72 hours. Urine analysis:    Component Value Date/Time   COLORURINE YELLOW 03/20/2020 0018   APPEARANCEUR HAZY (A) 03/20/2020 0018   LABSPEC 1.006 03/20/2020 0018   PHURINE 5.0 03/20/2020 0018   GLUCOSEU NEGATIVE 03/20/2020 0018   HGBUR NEGATIVE 03/20/2020 0018   BILIRUBINUR NEGATIVE 03/20/2020 0018   KETONESUR NEGATIVE 03/20/2020 0018   PROTEINUR NEGATIVE 03/20/2020 0018   UROBILINOGEN 1.0 11/12/2008 1440   NITRITE NEGATIVE 03/20/2020 0018   LEUKOCYTESUR NEGATIVE 03/20/2020 0018   Sepsis Labs: _0 (procalcitonin:4,lacticidven:4)  )No results found for this or any previous visit (from the past 240 hour(s)).    Radiology Studies: DG CHEST PORT 1 VIEW  Result Date: 06/23/2020 CLINICAL DATA:  Respiratory failure EXAM: PORTABLE CHEST 1 VIEW COMPARISON:  March 13, 2020 in June 15, 2019 FINDINGS: The tracheostomy tube is in good position. No pneumothorax. Nodularity in the periphery of the right upper lobe is stable. Hyperinflation of the lungs. The cardiomediastinal silhouette is stable. Increased interstitial markings on the right. More pronounced opacity in the left lung diffusely, most prominent in the left perihilar region. There is a focal opacity projected over the left hilum. No other acute abnormalities. IMPRESSION: 1. Tracheostomy tube is above. 2. New focal opacity in the left hilar region, suggestive of developing infiltrate/pneumonia. More diffuse opacity throughout the left mid lung may represent part of this infectious process.  Recommend attention on follow-up. 3. Mild diffuse increased interstitial opacities on the right may represent asymmetric edema versus atypical infection. 4. No other changes. Electronically Signed   By: Dorise Bullion III M.D   On: 06/23/2020 07:06     Scheduled Meds: . sodium chloride   Intravenous Once  . apixaban  5 mg Oral BID  . arformoterol  15 mcg Nebulization BID  . aspirin  81 mg Per Tube Daily  . budesonide  0.25 mg Nebulization BID  . chlorhexidine gluconate (MEDLINE KIT)  15 mL Mouth  Rinse BID  . Chlorhexidine Gluconate Cloth  6 each Topical Q0600  . clonazePAM  1 mg Per Tube BID  . colchicine  0.6 mg Per Tube Daily  . feeding supplement  237 mL Oral TID BM  . feeding supplement (OSMOLITE 1.5 CAL)  237 mL Per Tube BID  . feeding supplement (PROSource TF)  45 mL Per Tube BID  . free water  160 mL Per Tube TID BM  . insulin aspart  0-9 Units Subcutaneous Q4H  . levothyroxine  50 mcg Per Tube Q0600  . mouth rinse  15 mL Mouth Rinse 10 times per day  . multivitamin with minerals  1 tablet Per Tube Daily  . pantoprazole sodium  40 mg Per Tube QHS  . revefenacin  175 mcg Nebulization Daily  . rosuvastatin  40 mg Per Tube Daily  . temazepam  15 mg Per Tube QHS  . venlafaxine  37.5 mg Per Tube BID WC   Continuous Infusions: . sodium chloride Stopped (06/11/20 1143)     LOS: 20 days   Time Spent in minutes   30 minutes  Taccara Bushnell D.O. on 06/23/2020 at 11:54 AM  Between 7am to 7pm - Please see pager noted on [email or web address]  After 7pm go to [email or web address]  And look for the night coverage person covering for me after hours  [facility] Office  [phone]

## 2020-06-24 DIAGNOSIS — J449 Chronic obstructive pulmonary disease, unspecified: Secondary | ICD-10-CM | POA: Diagnosis not present

## 2020-06-24 DIAGNOSIS — J9601 Acute respiratory failure with hypoxia: Secondary | ICD-10-CM | POA: Diagnosis not present

## 2020-06-24 DIAGNOSIS — J9621 Acute and chronic respiratory failure with hypoxia: Secondary | ICD-10-CM | POA: Diagnosis not present

## 2020-06-24 DIAGNOSIS — F419 Anxiety disorder, unspecified: Secondary | ICD-10-CM | POA: Diagnosis not present

## 2020-06-24 LAB — GLUCOSE, CAPILLARY
Glucose-Capillary: 104 mg/dL — ABNORMAL HIGH (ref 70–99)
Glucose-Capillary: 107 mg/dL — ABNORMAL HIGH (ref 70–99)
Glucose-Capillary: 112 mg/dL — ABNORMAL HIGH (ref 70–99)
Glucose-Capillary: 114 mg/dL — ABNORMAL HIGH (ref 70–99)
Glucose-Capillary: 135 mg/dL — ABNORMAL HIGH (ref 70–99)
Glucose-Capillary: 73 mg/dL (ref 70–99)
Glucose-Capillary: 92 mg/dL (ref 70–99)

## 2020-06-24 LAB — VITAMIN B12: Vitamin B-12: 718 pg/mL (ref 180–914)

## 2020-06-24 LAB — OCCULT BLOOD X 1 CARD TO LAB, STOOL: Fecal Occult Bld: POSITIVE — AB

## 2020-06-24 LAB — RETICULOCYTES
Immature Retic Fract: 18 % — ABNORMAL HIGH (ref 2.3–15.9)
RBC.: 2.44 MIL/uL — ABNORMAL LOW (ref 3.87–5.11)
Retic Count, Absolute: 77.8 10*3/uL (ref 19.0–186.0)
Retic Ct Pct: 3.2 % — ABNORMAL HIGH (ref 0.4–3.1)

## 2020-06-24 LAB — IRON AND TIBC
Iron: 17 ug/dL — ABNORMAL LOW (ref 28–170)
Saturation Ratios: 5 % — ABNORMAL LOW (ref 10.4–31.8)
TIBC: 326 ug/dL (ref 250–450)
UIBC: 309 ug/dL

## 2020-06-24 LAB — FOLATE: Folate: 22.3 ng/mL (ref >5.9)

## 2020-06-24 LAB — HEMOGLOBIN AND HEMATOCRIT, BLOOD
HCT: 24.3 % — ABNORMAL LOW (ref 36.0–46.0)
Hemoglobin: 7.3 g/dL — ABNORMAL LOW (ref 12.0–15.0)

## 2020-06-24 LAB — FERRITIN: Ferritin: 116 ng/mL (ref 11–307)

## 2020-06-24 MED ORDER — CHLORHEXIDINE GLUCONATE 0.12 % MT SOLN
OROMUCOSAL | Status: AC
  Administered 2020-06-24: 15 mL
  Filled 2020-06-24: qty 15

## 2020-06-24 NOTE — Progress Notes (Signed)
PROGRESS NOTE    Kathleen Cameron  KKX:[id number] DOB: [birth year] DOA: 06/03/2020 PCP: Horald Pollen, MD   Brief Narrative:  HPI On 06/03/2020 by Ms. Merlene Laughter, NP (PCCM) Kathleen Cameron is a 55 y.o. with a past medical history significant for seizure disorder, hypertension, severe COPD, acute on chronic hypoxic and hypercapnic respiratory failure, and anxiety who presented to the ED via EMS with acute on chronic hypoxic and hypercapnic respiratory failure.  [facility]  Primary history obtained from chart review as patient is currently on ventilator through chronic trach.  It appears patient was treated at this facility November 2021 for acute on chronic hypoxic respiratory failure she required endotracheal intubation and eventual tracheostomy due to difficulty weaning 03/02/2020.  [facility]  Upon discharge home per chart review it appears patient's home ventilator malfunction much with resulted in respiratory failure and need for bag mask ventilation.  On arrival to ED patient was placed on ventilator with home vent settings with no further acute respiratory compromise.  PCCM was called for further management and admission.   Interim history Patient was admitted with acute respiratory distress after home ventilator failed.  She is admitted with acute on chronic hypoxic and hypercapnic respiratory failure, COPD.  Patient was also found to have a left subclavian DVT, and is since placed on Eliquis.  She continues to need ventilator assistance.  Plan is for home with home health on 06/25/2020. Assessment & Plan   Acute on chronic hypoxic and hypercapnic respiratory failure secondary to COPD exacerbation -Currently requiring home mechanical ventilation.  Patient was initially admitted in November 2021 for respiratory failure and required intubation at that time was  unable to wean and had tracheostomy placed.  [facility] in February 2022. -Currently on full ventilator support -Patient was placed on prednisone for 5 days -Continue Brovana, Pulmicort, Yupelri nebs  Severe sepsis -Present on admission -Suspect secondary to pneumonia- VAP vs HAP -Patient was placed on antibiotics and completed 7 days -Blood cultures showed staph hominis bacteremia which was presumed to be a contamination-patient was placed on Vancomycin and cefepime  Provoked left subclavian DVT/edema -Patient currently on Eliquis, will require this for 3 months through 09/09/2020 -Patient was noted to have low-grade fevers or tachycardia -Doppler was done on 06/12/20 showing left subclavian DVT -Was placed on doxycycline and completed course  Normocytic anemia/GI bleed versus anemia of acute illness -Hemoglobin baseline approximately 7-8 -No evidence of bleeding -Hemoglobin dropped to 6.6 today, patient was transfused 1 unit PRBC -Hemoglobin currently 7.3 today -Given drop in hemoglobin FOBT and anemia panel were obtained.  FOBT was positive.  Anemia panel does show iron 17, ferritin 116, saturation ratio 5 -Gastroenterology consulted and appreciated -Of note, patient did have a positive FOBT back in January 2022 -Patient is currently on Eliquis for DVT.  Will continue for now and wait for further recommendations from gastroenterology. -Continue to monitor CBC  Essential hypertension -BP currently stable off of medications, will continue to monitor  Hypothyroidism -Continue Synthroid  Acute metabolic encephalopathy/anxiety -Patient appears to be alert and oriented.  Suspect this is a resolved -Continue clonazepam, temazepam, venlafaxine  Right lower extremity pain -X-rays unremarkable -No edema or erythema on examination -Continue pain control with gabapentin, oxycodone  Nutritional needs -Patient has peg placed and  currently on tube feeds as well as a regular diet.  Elevated transaminases -Suspect secondary to sepsis -Resolved  DVT Prophylaxis  Eliquis  Code Status: Full  Family Communication: None at bedside  Disposition Plan:  Status is: Inpatient  Remains inpatient appropriate because:Unsafe d/c plan. Needs home health which has been arranged for 3/7.   Dispo: The patient is from: Home              Anticipated d/c is to: Home              Patient currently is not medically stable to d/c.   Difficult to place patient No   Consultants PCCM Gastroenterology  Procedures  Upper ext dopper  Antibiotics   Anti-infectives (From admission, onward)   Start     Dose/Rate Route Frequency Ordered Stop   06/19/20 1030  doxycycline (VIBRA-TABS) tablet 100 mg  Status:  Discontinued        100 mg Per Tube Every 12 hours 06/19/20 0942 06/21/20 0901   06/03/20 2000  vancomycin (VANCOREADY) IVPB 500 mg/100 mL  Status:  Discontinued        500 mg 100 mL/hr over 60 Minutes Intravenous Every 12 hours 06/03/20 0340 06/03/20 0827   06/03/20 0400  vancomycin (VANCOREADY) IVPB 1000 mg/200 mL        1,000 mg 200 mL/hr over 60 Minutes Intravenous  Once 06/03/20 0340 06/03/20 0528   06/03/20 0400  ceFEPIme (MAXIPIME) 2 g in sodium chloride 0.9 % 100 mL IVPB        2 g 200 mL/hr over 30 Minutes Intravenous Every 8 hours 06/03/20 0340 06/09/20 0630      Subjective:   Kathleen Cameron seen and examined today.  Patient with no complaints today.  Denies current shortness of breath, chest pain, abdominal pain, nausea or vomiting, dizziness or headache.    Objective:   Vitals:   06/24/20 1100 06/24/20 1152 06/24/20 1200 06/24/20 1230  BP: 116/86   120/82  Pulse: 90  (!) 105 (!) 104  Resp:   16 16  Temp:  98.8 F (37.1 C)    TempSrc:  Oral    SpO2:   97% 99%  Weight:      Height:        Intake/Output Summary (Last 24 hours) at 06/24/2020 1254 Last data filed at 06/24/2020 1202 Gross per 24 hour   Intake 230 ml  Output 821 ml  Net -591 ml   Filed Weights   06/21/20 0500 06/23/20 0500 06/24/20 0500  Weight: 56 kg 57.2 kg 57.2 kg   Exam  General: Well developed, chronically ill-appearing, NAD  HEENT: NCAT, mucous membranes moist.   Neck: Trach in place, on vent  Cardiovascular: S1 S2 auscultated, tachycardic  Respiratory: Clear to auscultation bilaterally   Abdomen: Soft, nontender, nondistended, + bowel sounds  Extremities: warm dry without cyanosis clubbing.  LE edema bilaterally  Neuro: AAOx3, nonfocal, moves all extremities and answers questions appropriately  Psych: pleasant, appropriate mood and affect   Data Reviewed: I have personally reviewed following labs and imaging studies  CBC: Recent Labs  Lab 06/18/20 0120 06/19/20 0249 06/21/20 0224 06/21/20 1328 06/22/20 0223 06/22/20 1146 06/23/20 0233 06/24/20 0146  WBC 7.5 8.0 8.9  --   --  8.4  --   --   HGB 7.1* 7.2* 6.6* 8.2* 7.4* 8.3* 7.6* 7.3*  HCT 23.6* 24.8* 22.3* 26.8* 23.8* 26.0* 23.9* 24.3*  MCV 93.3 95.4 94.9  --   --  90.9  --   --   PLT 264 279 268  --   --  247  --   --    Basic Metabolic Panel: Recent Labs  Lab 06/19/20 0249 06/21/20 0224 06/22/20 1146 06/23/20 0233  NA 144 141 142 140  K 4.1 4.3 3.9 3.7  CL 101 98 97* 97*  CO2 32 33* 37* 32  GLUCOSE 160* 149* 101* 79  BUN 16 23* 26* 23*  CREATININE 0.35* 0.39* 0.35* 0.32*  CALCIUM 9.1 9.0 9.2 9.1  MG  --  1.8  --  1.8  PHOS  --  3.4  --   --    GFR: Estimated Creatinine Clearance: 67.8 mL/min (A) (by C-G formula based on SCr of 0.32 mg/dL (L)). Liver Function Tests: No results for input(s): AST, ALT, ALKPHOS, BILITOT, PROT, ALBUMIN in the last 168 hours. No results for input(s): LIPASE, AMYLASE in the last 168 hours. No results for input(s): AMMONIA in the last 168 hours. Coagulation Profile: No results for input(s): INR, PROTIME in the last 168 hours. Cardiac Enzymes: Recent Labs  Lab 06/18/20 1020  CKTOTAL 30*    BNP (last 3 results) No results for input(s): PROBNP in the last 8760 hours. HbA1C: No results for input(s): HGBA1C in the last 72 hours. CBG: Recent Labs  Lab 06/23/20 1904 06/24/20 0001 06/24/20 0438 06/24/20 0734 06/24/20 1154  GLUCAP 123* 112* 92 73 114*   Lipid Profile: No results for input(s): CHOL, HDL, LDLCALC, TRIG, CHOLHDL, LDLDIRECT in the last 72 hours. Thyroid Function Tests: No results for input(s): TSH, T4TOTAL, FREET4, T3FREE, THYROIDAB in the last 72 hours. Anemia Panel: Recent Labs    06/24/20 0707  VITAMINB12 718  FOLATE 22.3  FERRITIN 116  TIBC 326  IRON 17*  RETICCTPCT 3.2*   Urine analysis:    Component Value Date/Time   COLORURINE YELLOW 03/20/2020 0018   APPEARANCEUR HAZY (A) 03/20/2020 0018   LABSPEC 1.006 03/20/2020 0018   PHURINE 5.0 03/20/2020 0018   GLUCOSEU NEGATIVE 03/20/2020 0018   HGBUR NEGATIVE 03/20/2020 0018   BILIRUBINUR NEGATIVE 03/20/2020 0018   KETONESUR NEGATIVE 03/20/2020 0018   PROTEINUR NEGATIVE 03/20/2020 0018   UROBILINOGEN 1.0 11/12/2008 1440   NITRITE NEGATIVE 03/20/2020 0018   LEUKOCYTESUR NEGATIVE 03/20/2020 0018   Sepsis Labs: $[email or web address]$ (procalcitonin:4,lacticidven:4)  )No results found for this or any previous visit (from the past 240 hour(s)).    Radiology Studies: DG CHEST PORT 1 VIEW  Result Date: 06/23/2020 CLINICAL DATA:  Respiratory failure EXAM: PORTABLE CHEST 1 VIEW COMPARISON:  March 13, 2020 in June 15, 2019 FINDINGS: The tracheostomy tube is in good position. No pneumothorax. Nodularity in the periphery of the right upper lobe is stable. Hyperinflation of the lungs. The cardiomediastinal silhouette is stable. Increased interstitial markings on the right. More pronounced opacity in the left lung diffusely, most prominent in the left perihilar region. There is a focal opacity projected over the left hilum. No other acute abnormalities. IMPRESSION: 1. Tracheostomy tube is above. 2. New focal  opacity in the left hilar region, suggestive of developing infiltrate/pneumonia. More diffuse opacity throughout the left mid lung may represent part of this infectious process. Recommend attention on follow-up. 3. Mild diffuse increased interstitial opacities on the right may represent asymmetric edema versus atypical infection. 4. No other changes. Electronically Signed   By: Dorise Bullion III M.D   On: 06/23/2020 07:06     Scheduled Meds: . sodium chloride   Intravenous Once  . apixaban  5 mg Oral BID  . arformoterol  15 mcg Nebulization BID  . aspirin  81 mg Per  Tube Daily  . budesonide  0.25 mg Nebulization BID  . chlorhexidine gluconate (MEDLINE KIT)  15 mL Mouth Rinse BID  . Chlorhexidine Gluconate Cloth  6 each Topical Q0600  . clonazePAM  1 mg Per Tube BID  . colchicine  0.6 mg Per Tube Daily  . feeding supplement  237 mL Oral TID BM  . feeding supplement (OSMOLITE 1.5 CAL)  237 mL Per Tube BID  . feeding supplement (PROSource TF)  45 mL Per Tube BID  . free water  160 mL Per Tube TID BM  . insulin aspart  0-9 Units Subcutaneous Q4H  . levothyroxine  50 mcg Per Tube Q0600  . mouth rinse  15 mL Mouth Rinse 10 times per day  . multivitamin with minerals  1 tablet Per Tube Daily  . pantoprazole sodium  40 mg Per Tube QHS  . revefenacin  175 mcg Nebulization Daily  . rosuvastatin  40 mg Per Tube Daily  . temazepam  15 mg Per Tube QHS  . venlafaxine  37.5 mg Per Tube BID WC   Continuous Infusions: . sodium chloride Stopped (06/11/20 1143)     LOS: 21 days   Time Spent in minutes   45 minutes  Maryann Mikhail D.O. on 06/24/2020 at 12:54 PM  Between 7am to 7pm - Please see pager noted on [email or web address]  After 7pm go to [email or web address]  And look for the night coverage person covering for me after hours  [facility] Office  [phone]

## 2020-06-25 DIAGNOSIS — J449 Chronic obstructive pulmonary disease, unspecified: Secondary | ICD-10-CM | POA: Diagnosis not present

## 2020-06-25 DIAGNOSIS — J9621 Acute and chronic respiratory failure with hypoxia: Secondary | ICD-10-CM | POA: Diagnosis not present

## 2020-06-25 DIAGNOSIS — J9601 Acute respiratory failure with hypoxia: Secondary | ICD-10-CM | POA: Diagnosis not present

## 2020-06-25 DIAGNOSIS — J9622 Acute and chronic respiratory failure with hypercapnia: Secondary | ICD-10-CM | POA: Diagnosis not present

## 2020-06-25 DIAGNOSIS — Z930 Tracheostomy status: Secondary | ICD-10-CM | POA: Diagnosis not present

## 2020-06-25 DIAGNOSIS — F419 Anxiety disorder, unspecified: Secondary | ICD-10-CM | POA: Diagnosis not present

## 2020-06-25 LAB — GLUCOSE, CAPILLARY
Glucose-Capillary: 114 mg/dL — ABNORMAL HIGH (ref 70–99)
Glucose-Capillary: 77 mg/dL (ref 70–99)
Glucose-Capillary: 83 mg/dL (ref 70–99)
Glucose-Capillary: 85 mg/dL (ref 70–99)
Glucose-Capillary: 86 mg/dL (ref 70–99)
Glucose-Capillary: 91 mg/dL (ref 70–99)

## 2020-06-25 LAB — PREPARE RBC (CROSSMATCH)

## 2020-06-25 LAB — APTT: aPTT: 61 seconds — ABNORMAL HIGH (ref 24–36)

## 2020-06-25 LAB — HEMOGLOBIN AND HEMATOCRIT, BLOOD
HCT: 21.7 % — ABNORMAL LOW (ref 36.0–46.0)
HCT: 31.1 % — ABNORMAL LOW (ref 36.0–46.0)
Hemoglobin: 6.6 g/dL — CL (ref 12.0–15.0)
Hemoglobin: 9.8 g/dL — ABNORMAL LOW (ref 12.0–15.0)

## 2020-06-25 LAB — HEPARIN LEVEL (UNFRACTIONATED): Heparin Unfractionated: 1.22 IU/mL — ABNORMAL HIGH (ref 0.30–0.70)

## 2020-06-25 MED ORDER — FUROSEMIDE 10 MG/ML IJ SOLN
20.0000 mg | Freq: Once | INTRAMUSCULAR | Status: AC
  Administered 2020-06-25: 20 mg via INTRAVENOUS
  Filled 2020-06-25: qty 2

## 2020-06-25 MED ORDER — PANTOPRAZOLE SODIUM 40 MG IV SOLR: 40.0000 mg | Freq: Two times a day (BID) | INTRAVENOUS | Status: DC

## 2020-06-25 MED ORDER — PEG 3350-KCL-NA BICARB-NACL 420 G PO SOLR
4000.0000 mL | Freq: Once | ORAL | Status: AC
  Administered 2020-06-25: 4000 mL
  Filled 2020-06-25: qty 4000

## 2020-06-25 MED ORDER — PANTOPRAZOLE SODIUM 40 MG IV SOLR
40.0000 mg | Freq: Two times a day (BID) | INTRAVENOUS | Status: DC
  Administered 2020-06-25 – 2020-06-30 (×11): 40 mg via INTRAVENOUS
  Filled 2020-06-25 (×11): qty 40

## 2020-06-25 MED ORDER — SODIUM CHLORIDE 0.9 % IV SOLN
INTRAVENOUS | Status: DC
  Administered 2020-06-26: 20 mL/h via INTRAVENOUS

## 2020-06-25 MED ORDER — HEPARIN (PORCINE) 25000 UT/250ML-% IV SOLN
950.0000 [IU]/h | INTRAVENOUS | Status: AC
  Administered 2020-06-25: 900 [IU]/h via INTRAVENOUS
  Filled 2020-06-25 (×2): qty 250

## 2020-06-25 MED ORDER — SODIUM CHLORIDE 0.9% IV SOLUTION: Freq: Once | INTRAVENOUS | Status: DC

## 2020-06-25 NOTE — TOC Progression Note (Addendum)
Transition of Care [facility]) - Progression Note    Patient Details  Name: Kathleen Cameron MRN: [id number] Date of Birth: [birth year]  Transition of Care [facility]) CM/SW Contact  Epifanio Lesches, RN Phone Number: [phone] 06/25/2020, 2:36 PM  Clinical Narrative:    NCM received call from [facility] HH/ Rob [phone]). Rob stated they have confirmed staffing for TTH and Friday and have shared with  husband, start care 06/26/2020 if medically ready for d/c. States same size trach and smaller trach, along with ambu bag will need to accompany pt to home. Wound care and diet orders, along with d/c summary will need to be faxed to [facility] @ [phone]. Post host f/u visit with PCP:   Primary Care at [name] [phone] [address] [place] [address]    Next Steps: Follow up on 07/02/2020    Instructions: 11 am with Dr. Eilleen Kempf Sagardia     [facility] team will continue to monitor and assist with TOC needs.....   06/25/2020 @ 1615 per pt's husband [facility] will provide transportation to home and to MD appointments.   [employer] RT, Peggye Fothergill number [phone], NCM left voice message sharing potential plan for d/c on 06/26/2020.  06/26/2020 0820 Plan:  EGD and colonoscopy today  Benefits check in process for Apixaban 5mg  BID.  Expected Discharge Plan: Home w Home Health Services Barriers to Discharge: Continued Medical Work up  Expected Discharge Plan and Services Expected Discharge Plan: Home w Home Health Services   Discharge Planning Services: CM Consult Post Acute Care Choice: Home Health,Durable Medical Equipment Living arrangements for the past 2 months: Apartment                                       Social Determinants of Health (SDOH) Interventions    Readmission Risk Interventions Readmission Risk Prevention Plan 02/02/2020  Transportation Screening Complete  PCP or Specialist Appt within 3-5 Days Complete  HRI or Home Care  Consult Complete  Social Work Consult for Recovery Care Planning/Counseling Complete  Palliative Care Screening Not Applicable  Medication Review 02/04/2020) Complete  Some recent data might be hidden

## 2020-06-25 NOTE — Consult Note (Addendum)
UNASSIGNED PATIENT Reason for Consult: Anemia with guaiac positive stools. Referring Physician: THP   Kathleen Cameron is an 55 y.o. female.  HPI: Kathleen Cameron is a 55 year old black female with multiple medical problems listed below, who was admitted to the hospital with acute respiratory distress after malfunctioning of the ventilator at home. She has acute on chronic hypoxia and hypercapnic respiratory failure complicated by severe COPD.  Additionally she was found to have a left subclavian DVT and was initially placed on Eliquis that was discontinued and now she is on heparin as she had a drop in her hemoglobin to 6.6 g/dL with guaiac positive stools.  She has a longstanding history of constipation and is on a bowel regimen in the hospital including MiraLAX stool softeners and Senokot.  As per my discussion with the nurse she had a large black BM today patient denies any melena or hematochezia.  She denies having any abdominal pain nausea vomiting.  She had similar symptoms when she was admitted in January but no work-up was undertaken.  As per my discussion with Dr. Catha Gosselin, the patient's husband wants to take her home and a work-up needs to be completed before she is discharged so that she does not end up with a GI bleed at home causing further complications. She claims she is never had a colonoscopy. She denies a family history of colon cancer. She received 2 units of packed red blood cells since drop in her hemoglobin over the last 4 days.  Her hemoglobin dropped from 8.2 g/dL to 6.6 g/dL today.  Patient has peg tube for supplemental bleeding is able to tolerate oral intake as well.  Her appetite is fair and her weights been stable.  Past Medical History:  Diagnosis Date  . Acute on chronic respiratory failure with hypoxia (HCC)   . Anxiety disorder   . Asthma   . Severe COPD (HCC)   . Hypertension   . Seizure disorder [facility])    Past Surgical History:  Procedure Laterality Date  .  IR FLUORO RM 30-60 MIN  03/23/2020  . LAPAROSCOPIC INSERTION GASTROSTOMY TUBE N/A 04/24/2020   Procedure: LAPAROSCOPIC GASTROSTOMY TUBE PLACEMENT;  Surgeon: Sheliah Hatch De Blanch, MD;  Location: [facility] OR;  Service: General;  Laterality: N/A;  . WISDOM TOOTH EXTRACTION     Family History  Family history unknown: Yes   Social History:  reports that she has never smoked. She has never used smokeless tobacco. She reports current alcohol use of about 2.0 standard drinks of alcohol per week. She reports that she does not use drugs.  Allergies:  Allergies  Allergen Reactions  . Stiolto Respimat [Tiotropium Bromide-Olodaterol] Other (See Comments)    Severe headaches and rapid heartrate   Medications: I have reviewed the patient's current medications.  Results for orders placed or performed during the hospital encounter of 06/03/20 (from the past 48 hour(s))  Glucose, capillary     Status: None   Collection Time: 06/23/20  3:36 PM  Result Value Ref Range   Glucose-Capillary 88 70 - 99 mg/dL    Comment: Glucose reference range applies only to samples taken after fasting for at least 8 hours.  Glucose, capillary     Status: Abnormal   Collection Time: 06/23/20  7:04 PM  Result Value Ref Range   Glucose-Capillary 123 (H) 70 - 99 mg/dL    Comment: Glucose reference range applies only to samples taken after fasting for at least 8 hours.  Glucose, capillary     Status:  Abnormal   Collection Time: 06/24/20 12:01 AM  Result Value Ref Range   Glucose-Capillary 112 (H) 70 - 99 mg/dL    Comment: Glucose reference range applies only to samples taken after fasting for at least 8 hours.   Comment 1 Notify RN   Hemoglobin and hematocrit, blood     Status: Abnormal   Collection Time: 06/24/20  1:46 AM  Result Value Ref Range   Hemoglobin 7.3 (L) 12.0 - 15.0 g/dL   HCT 76.1 (L) 60.7 - 37.1 %    Comment: Performed at [facility], [address] [place], [place] [address]  Glucose, capillary      Status: None   Collection Time: 06/24/20  4:38 AM  Result Value Ref Range   Glucose-Capillary 92 70 - 99 mg/dL    Comment: Glucose reference range applies only to samples taken after fasting for at least 8 hours.   Comment 1 Notify RN   Vitamin B12     Status: None   Collection Time: 06/24/20  7:07 AM  Result Value Ref Range   Vitamin B-12 718 180 - 914 pg/mL    Comment: (NOTE) This assay is not validated for testing neonatal or myeloproliferative syndrome specimens for Vitamin B12 levels. Performed at [facility], [address], [place], [place] [address]   Folate     Status: None   Collection Time: 06/24/20  7:07 AM  Result Value Ref Range   Folate 22.3 >5.9 ng/mL    Comment: Performed at [facility], [address], [place], [place] [address]  Iron and TIBC     Status: Abnormal   Collection Time: 06/24/20  7:07 AM  Result Value Ref Range   Iron 17 (L) 28 - 170 ug/dL   TIBC 009 381 - 829 ug/dL   Saturation Ratios 5 (L) 10.4 - 31.8 %   UIBC 309 ug/dL    Comment: Performed at [facility], [address], [place], [place] [address]  Ferritin     Status: None   Collection Time: 06/24/20  7:07 AM  Result Value Ref Range   Ferritin 116 11 - 307 ng/mL    Comment: Performed at [facility], [address] [place], [place] [address]  Reticulocytes     Status: Abnormal   Collection Time: 06/24/20  7:07 AM  Result Value Ref Range   Retic Ct Pct 3.2 (H) 0.4 - 3.1 %   RBC. 2.44 (L) 3.87 - 5.11 MIL/uL   Retic Count, Absolute 77.8 19.0 - 186.0 K/uL   Immature Retic Fract 18.0 (H) 2.3 - 15.9 %    Comment: Performed at [facility], [address], [place], [place] [address]  Glucose, capillary     Status: None   Collection Time: 06/24/20  7:34 AM  Result Value Ref Range   Glucose-Capillary 73 70 - 99 mg/dL    Comment: Glucose reference range applies only to samples taken after fasting for at least 8 hours.  Occult blood card to lab, stool      Status: Abnormal   Collection Time: 06/24/20 10:19 AM  Result Value Ref Range   Fecal Occult Bld POSITIVE (A) NEGATIVE    Comment: Performed at [facility], [address], [place], [place] [address]  Glucose, capillary     Status: Abnormal   Collection Time: 06/24/20 11:54 AM  Result Value Ref Range   Glucose-Capillary 114 (H) 70 - 99  mg/dL    Comment: Glucose reference range applies only to samples taken after fasting for at least 8 hours.  Glucose, capillary     Status: Abnormal   Collection Time: 06/24/20  4:17 PM  Result Value Ref Range   Glucose-Capillary 104 (H) 70 - 99 mg/dL    Comment: Glucose reference range applies only to samples taken after fasting for at least 8 hours.  Glucose, capillary     Status: Abnormal   Collection Time: 06/24/20  7:40 PM  Result Value Ref Range   Glucose-Capillary 135 (H) 70 - 99 mg/dL    Comment: Glucose reference range applies only to samples taken after fasting for at least 8 hours.  Glucose, capillary     Status: Abnormal   Collection Time: 06/24/20 11:22 PM  Result Value Ref Range   Glucose-Capillary 107 (H) 70 - 99 mg/dL    Comment: Glucose reference range applies only to samples taken after fasting for at least 8 hours.  Hemoglobin and hematocrit, blood     Status: Abnormal   Collection Time: 06/25/20  1:15 AM  Result Value Ref Range   Hemoglobin 6.6 (LL) 12.0 - 15.0 g/dL    Comment: REPEATED TO VERIFY THIS CRITICAL RESULT HAS VERIFIED AND BEEN CALLED [facility] RN BY MARSHA GARRETT ON 03 07 2022 AT 0218, AND HAS BEEN READ BACK.     HCT 21.7 (L) 36.0 - 46.0 %    Comment: Performed at [facility], [address] [place], [place] [address]  Prepare RBC (crossmatch)     Status: None   Collection Time: 06/25/20  2:30 AM  Result Value Ref Range   Order Confirmation      ORDER PROCESSED BY BLOOD BANK Performed at [facility], [address] [place], [place] [address]   Glucose, capillary     Status: None    Collection Time: 06/25/20  3:34 AM  Result Value Ref Range   Glucose-Capillary 77 70 - 99 mg/dL    Comment: Glucose reference range applies only to samples taken after fasting for at least 8 hours.  Type and screen [facility]     Status: None (Preliminary result)   Collection Time: 06/25/20  6:16 AM  Result Value Ref Range   ABO/RH(D) O POS    Antibody Screen NEG    Sample Expiration 06/28/2020,2359    Unit Number [id number]    Blood Component Type RED CELLS,LR    Unit division 00    Status of Unit ISSUED    Transfusion Status OK TO TRANSFUSE    Crossmatch Result      Compatible Performed at [facility], [address] [place], [place] [address]   Glucose, capillary     Status: None   Collection Time: 06/25/20  7:32 AM  Result Value Ref Range   Glucose-Capillary 83 70 - 99 mg/dL    Comment: Glucose reference range applies only to samples taken after fasting for at least 8 hours.  Glucose, capillary     Status: Abnormal   Collection Time: 06/25/20 11:08 AM  Result Value Ref Range   Glucose-Capillary 114 (H) 70 - 99 mg/dL    Comment: Glucose reference range applies only to samples taken after fasting for at least 8 hours.   Review of Systems  Constitutional: Positive for fatigue. Negative for activity change, appetite change, chills, diaphoresis, fever and unexpected weight change.  HENT: Negative.   Eyes: Negative.  Respiratory: Positive for shortness of breath. Negative for apnea, cough, choking, chest tightness, wheezing and stridor.   Cardiovascular: Negative.   Gastrointestinal: Positive for blood in stool and constipation. Negative for abdominal distention, abdominal pain, anal bleeding, diarrhea, nausea, rectal pain and vomiting.  Endocrine: Negative.   Musculoskeletal: Positive for arthralgias and back pain.  Allergic/Immunologic: Negative.   Neurological: Negative.   Hematological: Negative.   Psychiatric/Behavioral: Negative for  behavioral problems, confusion, decreased concentration, dysphoric mood and self-injury. The patient is nervous/anxious.    Blood pressure (!) 152/97, pulse 98, temperature 99.1 F (37.3 C), temperature source Oral, resp. rate (!) 21, height 5\' 4"  (1.626 m), weight 57.3 kg, SpO2 95 %. Physical Exam Constitutional:      General: She is not in acute distress.    Appearance: She is ill-appearing. She is not toxic-appearing.  HENT:     Head: Normocephalic and atraumatic.     Mouth/Throat:     Mouth: Mucous membranes are dry.  Eyes:     Extraocular Movements: Extraocular movements intact.  Neck:     Comments: There is a tracheostomy in place Cardiovascular:     Rate and Rhythm: Normal rate and regular rhythm.     Pulses: Normal pulses.  Pulmonary:     Breath sounds: Normal breath sounds.  Abdominal:     General: There is no distension.     Palpations: Abdomen is soft.     Tenderness: There is no abdominal tenderness. There is no guarding.     Comments: PEG is noted  Skin:    General: Skin is warm and dry.  Neurological:     General: No focal deficit present.     Mental Status: She is alert and oriented to person, place, and time.   Assessment/Plan: 1) Normocytic anemia with guaiac positive stools-we will prep the patient tonight to the G-tube with a bedside commode and do an EGD and colonoscopy tomorrow morning so we can rule out any source of bleeding prior to discharge.  Her heparin will have to be held for 4 hours prior to the procedures. 2) Chronic constipation on MiraLAX Senokot and Colace. 3) feeding difficulties-currently patient is being fed via PEG 4) Acute on chronic hypoxic and hypercapnic respiratory failure secondary to COPD exacerbation. 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 6) Hypothyroidism. 7) Acute metabolic encephalopathy. 8) Anxiety disorder. 5) HTN.   06/25/2020, 3:02 PM

## 2020-06-25 NOTE — Progress Notes (Signed)
NAME:  Kathleen Cameron, MRN:  [id number], DOB:  Apr 05, 1966, LOS: 22 ADMISSION DATE:  06/03/2020, CONSULTATION DATE:  2/12 REFERRING MD:  Pilar Plate, CHIEF COMPLAINT:  Dyspnea   Brief History:  55 year old female presented from home via EMS with complaints of acute respiratory distress after home ventilator failed.  [facility]  PCCM consulted for further management and admission. Diagnosed with HCAP and COPD exacerbation on admission.   Past Medical History:  Seizure disorder Hypertension Severe COPD Acute on chronic hypoxic and hypercapnic respiratory failure Anxiety  Significant Hospital Events:  Admitted 2/13, treated for hcap/copd exac 2/16 home vent 3/1 foot pain/elbow pain 3/3 podagra  Consults:  FEES 2/25 > without PMSV no aspiration, rec regular texture diet with thin liquids  Procedures:  Tracheostomy present prior to admission  Significant Diagnostic Tests:  2/23 Vas ultrasound upper extre> left subclavian DVT and superficial vein thrombosis 2/25 SLP  Micro Data:  2/13 sars cov 2/flu > negative 2/13 blood > staph hominis 1/4 2/13 resp > OPF 2/16 blood > No growth  Antimicrobials:  Cefepime 2/13 > 2/19 Vanc 2/13 x1  3/1 doxycycline  Interim History / Subjective:  Mag 1.8 on 3/3 Feet painful in 1st MTP bilaterally No labs 3/4 On home vent with good sats, in  NAD T Max 100 HGB 6.6 on 3/3 + 3 L , 2200 UO last 24 hours   Objective   Blood pressure 105/90, pulse 90, temperature 99.4 F (37.4 C), temperature source Oral, resp. rate 16, height 5' 4.02" (1.626 m), weight 57.3 kg, SpO2 100 %.    Vent Mode: AC FiO2 (%):  [40 %] 40 % Set Rate:  [16 bmp] 16 bmp Vt Set:  [480 mL] 480 mL PEEP:  [5 cmH20] 5 cmH20 Plateau Pressure:  [13 cmH20-24 cmH20] 13 cmH20   Intake/Output Summary (Last 24 hours) at 06/25/2020 0900 Last data filed at 06/24/2020 1625 Gross per 24 hour  Intake --  Output 471 ml  Net -471 ml   Filed Weights    06/23/20 0500 06/24/20 0500 06/25/20 0500  Weight: 57.2 kg 57.2 kg 57.3 kg    Examination: General:  Chronically ill middle aged F, reclined in bed NAD  HENT: NCAT pink mm Trach secure  PULM: CTAb, symmetrical chest expansion, Mechanically ventilated  CV: rrr s1s2 no rgm  GI: soft round ndnt + bowel sounds MSK: no cyanosis or clubbing.  Neuro: AAO following commands, conversant  Psych: pleasant mood and affect   Resolved Hospital Problem list     Assessment & Plan:   Acute on chronic hypoxemic and hypercarbic respiratory failure in setting of COPD S/p tracheostomy, chronic vent use  P Routing trach, vent care PCCM will continue to follow for vent/trach needs Ultimate plan is dc home with home health   Provoked L Subclav DVT P Cont eliquis x 22mo   Gout P Colchicine  Anemia P Transfused 3/7 AM  TRH plans to consult GI   Dispo: With need for transfusion this morning, dc home will be deferred, pending GI consult Ultimate goal is dc home with hh  Best practice (evaluated daily)    Goals of Care:    Labs   CBC: Recent Labs  Lab 06/19/20 0249 06/21/20 0224 06/21/20 1328 06/22/20 0223 06/22/20 1146 06/23/20 0233 06/24/20 0146 06/25/20 0115  WBC 8.0 8.9  --   --  8.4  --   --   --   HGB 7.2* 6.6*   < > 7.4* 8.3* 7.6*  7.3* 6.6*  HCT 24.8* 22.3*   < > 23.8* 26.0* 23.9* 24.3* 21.7*  MCV 95.4 94.9  --   --  90.9  --   --   --   PLT 279 268  --   --  247  --   --   --    < > = values in this interval not displayed.    Basic Metabolic Panel: Recent Labs  Lab 06/19/20 0249 06/21/20 0224 06/22/20 1146 06/23/20 0233  NA 144 141 142 140  K 4.1 4.3 3.9 3.7  CL 101 98 97* 97*  CO2 32 33* 37* 32  GLUCOSE 160* 149* 101* 79  BUN 16 23* 26* 23*  CREATININE 0.35* 0.39* 0.35* 0.32*  CALCIUM 9.1 9.0 9.2 9.1  MG  --  1.8  --  1.8  PHOS  --  3.4  --   --    GFR: Estimated Creatinine Clearance: 67.8 mL/min (A) (by C-G formula based on SCr of 0.32 mg/dL  (L)). Recent Labs  Lab 06/19/20 0249 06/21/20 0224 06/22/20 1146  WBC 8.0 8.9 8.4    Liver Function Tests: No results for input(s): AST, ALT, ALKPHOS, BILITOT, PROT, ALBUMIN in the last 168 hours. No results for input(s): LIPASE, AMYLASE in the last 168 hours. No results for input(s): AMMONIA in the last 168 hours.  ABG    Component Value Date/Time   PHART 7.495 (H) 05/19/2020 1055   PCO2ART 47.1 05/19/2020 1055   PO2ART 155 (H) 05/19/2020 1055   HCO3 33.0 (H) 06/03/2020 0052   TCO2 35 (H) 06/03/2020 0052   O2SAT 99.0 06/03/2020 0052    Coagulation Profile: No results for input(s): INR, PROTIME in the last 168 hours.  Cardiac Enzymes: Recent Labs  Lab 06/18/20 1020  CKTOTAL 30*    HbA1C: Hgb A1c MFr Bld  Date/Time Value Ref Range Status  06/21/2020 02:24 AM 5.3 4.8 - 5.6 % Final    Comment:    (NOTE) Pre diabetes:          5.7%-6.4%  Diabetes:              >6.4%  Glycemic control for   <7.0% adults with diabetes   02/26/2020 05:18 PM 5.4 4.8 - 5.6 % Final    Comment:    (NOTE) Pre diabetes:          5.7%-6.4%  Diabetes:              >6.4%  Glycemic control for   <7.0% adults with diabetes     CBG: Recent Labs  Lab 06/24/20 1617 06/24/20 1940 06/24/20 2322 06/25/20 0334 06/25/20 0732  GLUCAP 104* 135* 107* 77 83    CCT: n/a  Tessie Fass MSN, AGACNP-BC [facility] Pul[facility] Care Medicine [phone] If no answer, [phone] 06/25/2020, 9:01 AM

## 2020-06-25 NOTE — Progress Notes (Addendum)
ANTICOAGULATION CONSULT NOTE - Initial Consult  Pharmacy Consult for Heparin Indication: DVT  Allergies  Allergen Reactions  . Stiolto Respimat [Tiotropium Bromide-Olodaterol] Other (See Comments)    Severe headaches and rapid heartrate    Patient Measurements: Height: 5' 4.02" (162.6 cm) Weight: 57.3 kg (126 lb 5.2 oz) IBW/kg (Calculated) : 54.74 Heparin Dosing Weight: 57.3 kg   Vital Signs: Temp: 99.4 F (37.4 C) (03/07 0734) Temp Source: Oral (03/07 0734) BP: 105/90 (03/07 0719) Pulse Rate: 90 (03/07 0719)  Labs: Recent Labs    06/22/20 1146 06/23/20 0233 06/24/20 0146 06/25/20 0115  HGB 8.3* 7.6* 7.3* 6.6*  HCT 26.0* 23.9* 24.3* 21.7*  PLT 247  --   --   --   CREATININE 0.35* 0.32*  --   --     Estimated Creatinine Clearance: 67.8 mL/min (A) (by C-G formula based on SCr of 0.32 mg/dL (L)).   Medical History: Past Medical History:  Diagnosis Date  . Acute on chronic respiratory failure with hypoxia (HCC)   . Anxiety disorder   . Asthma   . COPD (chronic obstructive pulmonary disease) (HCC)   . COPD, severe (HCC)   . Hypertension   . Seizure disorder [facility])     Assessment: 55 yo female admitted on 06/03/2020 with acute respiratory distress after home ventilator failed. 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 Today, hgb 6.6. Pharmacy consulted to start heparin due to concern of drop in hemoglobin. FOBT positive 3/6. GI consulted. Spoke with RN and no bleeding noted. Last dose of apixaban 3/6 at 2100.   Goal of Therapy:  Heparin level 0.3 - 0.5 units/ml; No bolus Monitor platelets by anticoagulation protocol: Yes   Plan:  Start heparin 900 units/hr, no bolus Check 6h aPTT and heparin level Monitor via aPTT until heparin level and aPTT correlating due to previous apixaban effecting heparin level Monitor heparin level, CBC, and s/s of bleeding daily  Follow up GI recommendations   Gerrit Halls, PharmD Clinical  Pharmacist  06/25/2020,9:46 AM

## 2020-06-25 NOTE — Progress Notes (Signed)
ANTICOAGULATION CONSULT NOTE  Pharmacy Consult for Heparin Indication: DVT  Allergies  Allergen Reactions  . Stiolto Respimat [Tiotropium Bromide-Olodaterol] Other (See Comments)    Severe headaches and rapid heartrate    Patient Measurements: Height: 5\' 4"  (162.6 cm) Weight: 57.3 kg (126 lb 5.2 oz) IBW/kg (Calculated) : 54.7 Heparin Dosing Weight: 57.3 kg   Vital Signs: Temp: 98.9 F (37.2 C) (03/07 1932) Temp Source: Oral (03/07 1932) BP: 140/95 (03/07 1900) Pulse Rate: 99 (03/07 1953)  Labs: Recent Labs    06/23/20 0233 06/24/20 0146 06/25/20 0115 06/25/20 1930 06/25/20 2027  HGB 7.6* 7.3* 6.6* 9.8*  --   HCT 23.9* 24.3* 21.7* 31.1*  --   APTT  --   --   --   --  61*  HEPARINUNFRC  --   --   --   --  1.22*  CREATININE 0.32*  --   --   --   --     Estimated Creatinine Clearance: 67.8 mL/min (A) (by C-G formula based on SCr of 0.32 mg/dL (L)).   Medical History: Past Medical History:  Diagnosis Date  . Acute on chronic respiratory failure with hypoxia (HCC)   . Anxiety disorder   . Asthma   . COPD (chronic obstructive pulmonary disease) (HCC)   . COPD, severe (HCC)   . Hypertension   . Seizure disorder [facility])     Assessment: 55 yo female admitted on 06/03/2020 with acute respiratory distress after home ventilator failed. 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 Today, hgb 6.6. Pharmacy consulted to start heparin due to concern of drop in hemoglobin. FOBT positive 3/6. GI consulted. Spoke with RN and no bleeding noted. Last dose of apixaban 3/6 at 2100.   Hgb trended up to 9.8 after 1 PRBC. Heparin level is elevated as expected given recent DOAC at 1.22, aPTT is slightly subtherapeutic at 61, on 900 units/hr. No s/sx of bleeding or infusion issues.   Goal of Therapy:  Heparin level 0.3 - 0.5 units/ml; No bolus APTT 66-85 Monitor platelets by anticoagulation protocol: Yes   Plan:  Increase heparin infusion to 950  units/hr Check 6h aPTT and heparin level Monitor via aPTT until heparin level and aPTT correlating due to previous apixaban effecting heparin level Monitor heparin level, CBC, and s/s of bleeding daily  Hold 4 hrs prior to EGD/colonoscopy 3/8  5/8, PharmD, BCCCP Clinical Pharmacist  Phone: [phone] 06/25/2020 9:54 PM  Please check AMION for all [facility] Pharmacy phone numbers After 10:00 PM, call Main Pharmacy [phone]

## 2020-06-25 NOTE — Progress Notes (Signed)
Date and time results received: 06/25/20 0218  Test: Hgb  Critical Value: 6.6  Name of Provider Notified: Elink  Orders Received? Awaiting orders

## 2020-06-25 NOTE — Progress Notes (Signed)
Physical Therapy Treatment Patient Details Name: Kathleen Cameron MRN: [id number] DOB: 1966-03-04 Today's Date: 06/25/2020    History of Present Illness 55 y.o. female admitted 2/13 from home with respiratory distress due to home vent malfunction after same day D/C from [facility] with home vent. PMHx pt with [facility] admission 11/6 with trach 03/02/20 ant transition to [facility] 11/18-2/12, 04/25/19 Gtube placed, COPD, hypertension, depression/anxiety, HLD, shingles infection,    PT Comments    Patient initially refusing PT due to fatigue. Able to encourage her to participate with bed level exercises, especially after she commented that she is uncomfortable due to inability to have a BM. Patient agreed she felt better after exercises.     Follow Up Recommendations  SNF;Supervision/Assistance - 24 hour     Equipment Recommendations  None recommended by PT    Recommendations for Other Services       Precautions / Restrictions Precautions Precautions: Fall;Other (comment) Precaution Comments: trach, on home vent    Mobility  Bed Mobility               General bed mobility comments: pt refused to attempt sit EOB    Transfers                    Ambulation/Gait             General Gait Details: unable   Stairs             Wheelchair Mobility    Modified Rankin (Stroke Patients Only)       Balance                                            Cognition Arousal/Alertness: Awake/alert Behavior During Therapy: WFL for tasks assessed/performed Overall Cognitive Status: Within Functional Limits for tasks assessed                                 General Comments: pt mouthing words, nodding and following commands. Trach and difficult to read lips at times      Exercises General Exercises - Upper Extremity Shoulder Flexion: Both;5 reps;Supine;AROM Elbow Flexion: Both;Supine;AROM;10 reps General Exercises - Lower  Extremity Ankle Circles/Pumps: AROM;Both;5 reps Heel Slides: AAROM;Both;10 reps;Supine;Strengthening (assisted flexion; resisted extension) Hip ABduction/ADduction: AAROM;Both;10 reps;Supine    General Comments        Pertinent Vitals/Pain Pain Assessment: No/denies pain    Home Living                      Prior Function            PT Goals (current goals can now be found in the care plan section) Acute Rehab PT Goals Patient Stated Goal: agreeable to strengthening and progressive activity Time For Goal Achievement: 07/05/20 Potential to Achieve Goals: Fair Progress towards PT goals: Not progressing toward goals - comment (Tired; only agreed to supine activities)    Frequency    Min 2X/week      PT Plan Current plan remains appropriate    Co-evaluation              AM-PAC PT "6 Clicks" Mobility   Outcome Measure  Help needed turning from your back to your side while in a flat bed without using bedrails?: A Lot Help needed moving from  lying on your back to sitting on the side of a flat bed without using bedrails?: A Lot Help needed moving to and from a bed to a chair (including a wheelchair)?: Total Help needed standing up from a chair using your arms (e.g., wheelchair or bedside chair)?: Total Help needed to walk in hospital room?: Total Help needed climbing 3-5 steps with a railing? : Total 6 Click Score: 8    End of Session Equipment Utilized During Treatment: Other (comment) (vent) Activity Tolerance: Patient limited by fatigue Patient left: in bed;with call bell/phone within reach;with bed alarm set Nurse Communication: Other (comment) (pt's bed wet and needs assist) PT Visit Diagnosis: Other abnormalities of gait and mobility (R26.89);Difficulty in walking, not elsewhere classified (R26.2);Muscle weakness (generalized) (M62.81)     Time: 1348-1400 PT Time Calculation (min) (ACUTE ONLY): 12 min  Charges:  $Therapeutic Exercise: 8-22  mins                      Kathleen Cameron, PT Pager [phone]    Zena Amos 06/25/2020, 3:09 PM

## 2020-06-25 NOTE — Progress Notes (Signed)
eLink Physician-Brief Progress Note Patient Name: Kathleen Cameron DOB: [birth year] MRN: [id number]   Date of Service  06/25/2020  HPI/Events of Note  Hgb 6.6.  eICU Interventions  Ordered transfusion of 1u PRBCs.     Intervention Category Intermediate Interventions: Bleeding - evaluation and treatment with blood products  Marveen Reeks Alayssa Flinchum 06/25/2020, 2:30 AM

## 2020-06-25 NOTE — Progress Notes (Signed)
PROGRESS NOTE    Kathleen Cameron  EUM:[id number] DOB: [birth year] DOA: 06/03/2020 PCP: Horald Pollen, MD   Brief Narrative:  HPI On 06/03/2020 by Ms. Merlene Laughter, NP (PCCM) Kathleen Cameron is a 55 y.o. with a past medical history significant for seizure disorder, hypertension, severe COPD, acute on chronic hypoxic and hypercapnic respiratory failure, and anxiety who presented to the ED via EMS with acute on chronic hypoxic and hypercapnic respiratory failure.  Of note [facility] prior to admission.  Primary history obtained from chart review as patient is currently on ventilator through chronic trach.  It appears patient was treated at this facility November 2021 for acute on chronic hypoxic respiratory failure she required endotracheal intubation and eventual tracheostomy due to difficulty weaning 03/02/2020.  On 11/18 [facility]  Upon discharge home per chart review it appears patient's home ventilator malfunction much with resulted in respiratory failure and need for bag mask ventilation.  On arrival to ED patient was placed on ventilator with home vent settings with no further acute respiratory compromise.  PCCM was called for further management and admission.   Interim history Patient was admitted with acute respiratory distress after home ventilator failed.  She is admitted with acute on chronic hypoxic and hypercapnic respiratory failure, COPD.  Patient was also found to have a left subclavian DVT, and is since placed on Eliquis.  She continues to need ventilator assistance.  Patient with further anemia and requiring blood transfusion. Will hold Eliquis and place on heparin. GI consulted as FOBT +. Assessment & Plan   Acute on chronic hypoxic and hypercapnic respiratory failure secondary to COPD exacerbation -Currently requiring home mechanical ventilation.  Patient was initially admitted in  November 2021 for respiratory failure and required intubation at that time was unable to wean and had tracheostomy placed.  [facility] and then was discharged home in February 2022. -Currently on full ventilator support -Patient was placed on prednisone for 5 days -Continue Brovana, Pulmicort, Yupelri nebs  Severe sepsis -Present on admission -Suspect secondary to pneumonia- VAP vs HAP -Patient was placed on antibiotics and completed 7 days -Blood cultures showed staph hominis bacteremia which was presumed to be a contamination-patient was placed on Vancomycin and cefepime  Provoked left subclavian DVT/edema -Patient was placed on Eliquis, will require this for 3 months through 09/09/2020 -Patient was noted to have low-grade fevers or tachycardia -Doppler was done on 06/12/20 showing left subclavian DVT -Was placed on doxycycline and completed course -Have held Eliquis and will place on heparin  Normocytic anemia/GI bleed versus anemia of acute illness -Hemoglobin baseline approximately 7-8 -No evidence of bleeding -Hemoglobin has dropped again to 6.6 today, will transfuse 1 unit PRBC (this will be her second unit of blood during this hospitalization) -Given drop in hemoglobin FOBT and anemia panel were obtained.  FOBT was positive.  Anemia panel does show iron 17, ferritin 116, saturation ratio 5 -Gastroenterology consulted and appreciated -Of note, patient did have a positive FOBT back in January 2022 -Patient is currently on Eliquis for DVT.  Have held Eliquis and will place on heparin  -Pending gastroenterology recommendations  -Continue to monitor CBC  Essential hypertension -BP currently stable off of medications, will continue to monitor  Hypothyroidism -Continue Synthroid  Acute metabolic encephalopathy/anxiety -Patient appears to be alert and oriented.  Suspect this is a resolved -Continue clonazepam, temazepam, venlafaxine  Right lower  extremity pain -X-rays unremarkable -  No edema or erythema on examination -Continue pain control with gabapentin, oxycodone  Nutritional needs -Patient has peg placed and currently on tube feeds as well as a regular diet.  Elevated transaminases -Suspect secondary to sepsis -Resolved  DVT Prophylaxis  Eliquis  Code Status: Full  Family Communication: None at bedside  Disposition Plan:  Status is: Inpatient  Remains inpatient appropriate because:Unsafe d/c plan and Inpatient level of care appropriate due to severity of illness.  Hospitalization complicated by anemia as well as positive FOBT.  Pending gastroenterology consultation.  Dispo: The patient is from: Home              Anticipated d/c is to: Home with home health services              Patient currently is not medically stable to d/c.   Difficult to place patient No   Consultants PCCM Gastroenterology  Procedures  Upper ext dopper  Antibiotics   Anti-infectives (From admission, onward)   Start     Dose/Rate Route Frequency Ordered Stop   06/19/20 1030  doxycycline (VIBRA-TABS) tablet 100 mg  Status:  Discontinued        100 mg Per Tube Every 12 hours 06/19/20 0942 06/21/20 0901   06/03/20 2000  vancomycin (VANCOREADY) IVPB 500 mg/100 mL  Status:  Discontinued        500 mg 100 mL/hr over 60 Minutes Intravenous Every 12 hours 06/03/20 0340 06/03/20 0827   06/03/20 0400  vancomycin (VANCOREADY) IVPB 1000 mg/200 mL        1,000 mg 200 mL/hr over 60 Minutes Intravenous  Once 06/03/20 0340 06/03/20 0528   06/03/20 0400  ceFEPIme (MAXIPIME) 2 g in sodium chloride 0.9 % 100 mL IVPB        2 g 200 mL/hr over 30 Minutes Intravenous Every 8 hours 06/03/20 0340 06/09/20 0630      Subjective:   Kathleen Cameron seen and examined today.  Patient with no complaints today.  Currently denies chest pain, shortness of breath, abdominal pain, nausea or vomiting, diarrhea or constipation.  Was looking forward to going home  today.  Tells me she has never had a colonoscopy before or an endoscopy.    Objective:   Vitals:   06/25/20 0734 06/25/20 0800 06/25/20 0900 06/25/20 1000  BP:  97/69 113/78 140/87  Pulse:  (!) 106 (!) 102 98  Resp:  (!) $Re'22 16 16  'Ytg$ Temp: 99.4 F (37.4 C)     TempSrc: Oral     SpO2:  95% 100% 99%  Weight:   57.3 kg   Height:   '5\' 4"'$  (1.626 m)     Intake/Output Summary (Last 24 hours) at 06/25/2020 1042 Last data filed at 06/25/2020 2836 Gross per 24 hour  Intake 457 ml  Output 450 ml  Net 7 ml   Filed Weights   06/24/20 0500 06/25/20 0500 06/25/20 0900  Weight: 57.2 kg 57.3 kg 57.3 kg   Exam  General: Well developed, chronically ill-appearing, NAD  HEENT: NCAT, mucous membranes moist.   Neck: Trach in place  Cardiovascular: S1 S2 auscultated, tachycardic, cannot appreciate murmur  Respiratory: Diminished breath sounds however clear  Abdomen: Soft, nontender, nondistended, + bowel sounds, PEG tube in place  Extremities: warm dry without cyanosis clubbing.  LE edema bilaterally, 2+  Neuro: AAOx3, nonfocal, can move all extremities and answer questions appropriately  Psych: pleasant, appropriate mood and affect  Data Reviewed: I have personally reviewed following labs and imaging studies  CBC: Recent Labs  Lab 06/19/20 0249 06/21/20 0224 06/21/20 1328 06/22/20 0223 06/22/20 1146 06/23/20 0233 06/24/20 0146 06/25/20 0115  WBC 8.0 8.9  --   --  8.4  --   --   --   HGB 7.2* 6.6*   < > 7.4* 8.3* 7.6* 7.3* 6.6*  HCT 24.8* 22.3*   < > 23.8* 26.0* 23.9* 24.3* 21.7*  MCV 95.4 94.9  --   --  90.9  --   --   --   PLT 279 268  --   --  247  --   --   --    < > = values in this interval not displayed.   Basic Metabolic Panel: Recent Labs  Lab 06/19/20 0249 06/21/20 0224 06/22/20 1146 06/23/20 0233  NA 144 141 142 140  K 4.1 4.3 3.9 3.7  CL 101 98 97* 97*  CO2 32 33* 37* 32  GLUCOSE 160* 149* 101* 79  BUN 16 23* 26* 23*  CREATININE 0.35* 0.39* 0.35* 0.32*   CALCIUM 9.1 9.0 9.2 9.1  MG  --  1.8  --  1.8  PHOS  --  3.4  --   --    GFR: Estimated Creatinine Clearance: 67.8 mL/min (A) (by C-G formula based on SCr of 0.32 mg/dL (L)). Liver Function Tests: No results for input(s): AST, ALT, ALKPHOS, BILITOT, PROT, ALBUMIN in the last 168 hours. No results for input(s): LIPASE, AMYLASE in the last 168 hours. No results for input(s): AMMONIA in the last 168 hours. Coagulation Profile: No results for input(s): INR, PROTIME in the last 168 hours. Cardiac Enzymes: No results for input(s): CKTOTAL, CKMB, CKMBINDEX, TROPONINI in the last 168 hours. BNP (last 3 results) No results for input(s): PROBNP in the last 8760 hours. HbA1C: No results for input(s): HGBA1C in the last 72 hours. CBG: Recent Labs  Lab 06/24/20 1617 06/24/20 1940 06/24/20 2322 06/25/20 0334 06/25/20 0732  GLUCAP 104* 135* 107* 77 83   Lipid Profile: No results for input(s): CHOL, HDL, LDLCALC, TRIG, CHOLHDL, LDLDIRECT in the last 72 hours. Thyroid Function Tests: No results for input(s): TSH, T4TOTAL, FREET4, T3FREE, THYROIDAB in the last 72 hours. Anemia Panel: Recent Labs    06/24/20 0707  VITAMINB12 718  FOLATE 22.3  FERRITIN 116  TIBC 326  IRON 17*  RETICCTPCT 3.2*   Urine analysis:    Component Value Date/Time   COLORURINE YELLOW 03/20/2020 0018   APPEARANCEUR HAZY (A) 03/20/2020 0018   LABSPEC 1.006 03/20/2020 0018   PHURINE 5.0 03/20/2020 0018   GLUCOSEU NEGATIVE 03/20/2020 0018   HGBUR NEGATIVE 03/20/2020 0018   BILIRUBINUR NEGATIVE 03/20/2020 0018   KETONESUR NEGATIVE 03/20/2020 0018   PROTEINUR NEGATIVE 03/20/2020 0018   UROBILINOGEN 1.0 11/12/2008 1440   NITRITE NEGATIVE 03/20/2020 0018   LEUKOCYTESUR NEGATIVE 03/20/2020 0018   Sepsis Labs: $[email or web address]$ (procalcitonin:4,lacticidven:4)  )No results found for this or any previous visit (from the past 240 hour(s)).    Radiology Studies: No results found.   Scheduled Meds: . sodium  chloride   Intravenous Once  . sodium chloride   Intravenous Once  . arformoterol  15 mcg Nebulization BID  . aspirin  81 mg Per Tube Daily  . budesonide  0.25 mg Nebulization BID  . chlorhexidine gluconate (MEDLINE KIT)  15 mL Mouth Rinse BID  . Chlorhexidine Gluconate Cloth  6 each Topical Q0600  . clonazePAM  1 mg Per Tube BID  . colchicine  0.6 mg Per Tube Daily  .  feeding supplement  237 mL Oral TID BM  . feeding supplement (OSMOLITE 1.5 CAL)  237 mL Per Tube BID  . feeding supplement (PROSource TF)  45 mL Per Tube BID  . free water  160 mL Per Tube TID BM  . insulin aspart  0-9 Units Subcutaneous Q4H  . levothyroxine  50 mcg Per Tube Q0600  . mouth rinse  15 mL Mouth Rinse 10 times per day  . multivitamin with minerals  1 tablet Per Tube Daily  . pantoprazole sodium  40 mg Per Tube QHS  . revefenacin  175 mcg Nebulization Daily  . rosuvastatin  40 mg Per Tube Daily  . temazepam  15 mg Per Tube QHS  . venlafaxine  37.5 mg Per Tube BID WC   Continuous Infusions: . sodium chloride Stopped (06/11/20 1143)  . heparin       LOS: 22 days   Time Spent in minutes   45 minutes  Kathleen Cameron D.O. on 06/25/2020 at 10:42 AM  Between 7am to 7pm - Please see pager noted on [email or web address]  After 7pm go to [email or web address]  And look for the night coverage person covering for me after hours  [facility] Office  [phone]

## 2020-06-26 ENCOUNTER — Encounter (HOSPITAL_COMMUNITY): Payer: Self-pay | Admitting: Pulmonary Disease

## 2020-06-26 ENCOUNTER — Encounter (HOSPITAL_COMMUNITY)
Admission: EM | Disposition: A | Discharge status: Home Health | Payer: Self-pay | Source: Home / Self Care | Attending: Pulmonary Disease

## 2020-06-26 DIAGNOSIS — J9601 Acute respiratory failure with hypoxia: Secondary | ICD-10-CM | POA: Diagnosis not present

## 2020-06-26 DIAGNOSIS — J9622 Acute and chronic respiratory failure with hypercapnia: Secondary | ICD-10-CM | POA: Diagnosis not present

## 2020-06-26 DIAGNOSIS — J449 Chronic obstructive pulmonary disease, unspecified: Secondary | ICD-10-CM | POA: Diagnosis not present

## 2020-06-26 DIAGNOSIS — J9621 Acute and chronic respiratory failure with hypoxia: Secondary | ICD-10-CM | POA: Diagnosis not present

## 2020-06-26 HISTORY — PX: HOT HEMOSTASIS: SHX5433

## 2020-06-26 HISTORY — PX: HEMOSTASIS CLIP PLACEMENT: SHX6857

## 2020-06-26 HISTORY — PX: COLONOSCOPY: SHX5424

## 2020-06-26 HISTORY — PX: POLYPECTOMY: SHX5525

## 2020-06-26 HISTORY — PX: ENTEROSCOPY: SHX5533

## 2020-06-26 LAB — CBC
HCT: 27.3 % — ABNORMAL LOW (ref 36.0–46.0)
Hemoglobin: 8.7 g/dL — ABNORMAL LOW (ref 12.0–15.0)
MCH: 28.8 pg (ref 26.0–34.0)
MCHC: 31.9 g/dL (ref 30.0–36.0)
MCV: 90.4 fL (ref 80.0–100.0)
Platelets: 230 10*3/uL (ref 150–400)
RBC: 3.02 MIL/uL — ABNORMAL LOW (ref 3.87–5.11)
RDW: 16.6 % — ABNORMAL HIGH (ref 11.5–15.5)
WBC: 8.8 10*3/uL (ref 4.0–10.5)
nRBC: 0 % (ref 0.0–0.2)

## 2020-06-26 LAB — TYPE AND SCREEN
ABO/RH(D): O POS
Antibody Screen: NEGATIVE
Unit division: 0

## 2020-06-26 LAB — BASIC METABOLIC PANEL
Anion gap: 8 (ref 5–15)
BUN: 19 mg/dL (ref 6–20)
CO2: 36 mmol/L — ABNORMAL HIGH (ref 22–32)
Calcium: 9.4 mg/dL (ref 8.9–10.3)
Chloride: 98 mmol/L (ref 98–111)
Creatinine, Ser: 0.41 mg/dL — ABNORMAL LOW (ref 0.44–1.00)
GFR, Estimated: >60 mL/min (ref >60)
Glucose, Bld: 124 mg/dL — ABNORMAL HIGH (ref 70–99)
Potassium: 4 mmol/L (ref 3.5–5.1)
Sodium: 142 mmol/L (ref 135–145)

## 2020-06-26 LAB — GLUCOSE, CAPILLARY
Glucose-Capillary: 107 mg/dL — ABNORMAL HIGH (ref 70–99)
Glucose-Capillary: 61 mg/dL — ABNORMAL LOW (ref 70–99)
Glucose-Capillary: 62 mg/dL — ABNORMAL LOW (ref 70–99)
Glucose-Capillary: 110 mg/dL — ABNORMAL HIGH (ref 70–99)
Glucose-Capillary: 56 mg/dL — ABNORMAL LOW (ref 70–99)
Glucose-Capillary: 123 mg/dL — ABNORMAL HIGH (ref 70–99)
Glucose-Capillary: 47 mg/dL — ABNORMAL LOW (ref 70–99)
Glucose-Capillary: 124 mg/dL — ABNORMAL HIGH (ref 70–99)
Glucose-Capillary: 93 mg/dL (ref 70–99)
Glucose-Capillary: 91 mg/dL (ref 70–99)
Glucose-Capillary: 115 mg/dL — ABNORMAL HIGH (ref 70–99)

## 2020-06-26 LAB — BPAM RBC
Blood Product Expiration Date: 202204012359
ISSUE DATE / TIME: 202203071034
Unit Type and Rh: 5100

## 2020-06-26 LAB — HEPARIN LEVEL (UNFRACTIONATED): Heparin Unfractionated: 0.77 IU/mL — ABNORMAL HIGH (ref 0.30–0.70)

## 2020-06-26 LAB — APTT
aPTT: 81 seconds — ABNORMAL HIGH (ref 24–36)
aPTT: 86 seconds — ABNORMAL HIGH (ref 24–36)

## 2020-06-26 SURGERY — ENTEROSCOPY
Anesthesia: Moderate Sedation

## 2020-06-26 MED ORDER — SODIUM CHLORIDE 0.9 % IV SOLN: INTRAVENOUS | Status: DC

## 2020-06-26 MED ORDER — DIPHENHYDRAMINE HCL 50 MG/ML IJ SOLN
INTRAMUSCULAR | Status: AC
  Filled 2020-06-26: qty 1

## 2020-06-26 MED ORDER — DEXTROSE 50 % IV SOLN
INTRAVENOUS | Status: AC
  Administered 2020-06-26: 50 mL
  Filled 2020-06-26: qty 50

## 2020-06-26 MED ORDER — HEPARIN (PORCINE) 25000 UT/250ML-% IV SOLN
1000.0000 [IU]/h | INTRAVENOUS | Status: DC
  Administered 2020-06-26 – 2020-06-27 (×3): 950 [IU]/h via INTRAVENOUS
  Filled 2020-06-26 (×3): qty 250

## 2020-06-26 MED ORDER — MIDAZOLAM HCL (PF) 5 MG/ML IJ SOLN
INTRAMUSCULAR | Status: AC
  Filled 2020-06-26: qty 2

## 2020-06-26 MED ORDER — MIDAZOLAM HCL 5 MG/5ML IJ SOLN
INTRAMUSCULAR | Status: DC | PRN
  Administered 2020-06-26: 1 mg via INTRAVENOUS
  Administered 2020-06-26: 2 mg via INTRAVENOUS
  Administered 2020-06-26: 1 mg via INTRAVENOUS

## 2020-06-26 MED ORDER — AMLODIPINE BESYLATE 5 MG PO TABS
5.0000 mg | ORAL_TABLET | Freq: Every day | ORAL | Status: DC
  Administered 2020-06-27 – 2020-07-02 (×6): 5 mg
  Filled 2020-06-26 (×6): qty 1

## 2020-06-26 MED ORDER — GLUCAGON HCL RDNA (DIAGNOSTIC) 1 MG IJ SOLR
INTRAMUSCULAR | Status: AC
  Filled 2020-06-26: qty 1

## 2020-06-26 MED ORDER — FENTANYL CITRATE (PF) 100 MCG/2ML IJ SOLN
INTRAMUSCULAR | Status: AC
  Filled 2020-06-26: qty 4

## 2020-06-26 MED ORDER — FENTANYL CITRATE (PF) 100 MCG/2ML IJ SOLN
INTRAMUSCULAR | Status: DC | PRN
  Administered 2020-06-26: 25 ug via INTRAVENOUS
  Administered 2020-06-26: 50 ug via INTRAVENOUS
  Administered 2020-06-26: 25 ug via INTRAVENOUS

## 2020-06-26 MED ORDER — GLUCAGON HCL RDNA (DIAGNOSTIC) 1 MG IJ SOLR
INTRAMUSCULAR | Status: DC | PRN
  Administered 2020-06-26: .5 mg via INTRAVENOUS

## 2020-06-26 MED ORDER — DEXTROSE 50 % IV SOLN
INTRAVENOUS | Status: AC
  Administered 2020-06-26 (×2): 25 mL
  Filled 2020-06-26: qty 50

## 2020-06-26 SURGICAL SUPPLY — 14 items

## 2020-06-26 NOTE — Progress Notes (Signed)
PROGRESS NOTE    Kathleen Cameron  VPX:[id number] DOB: [birth year] DOA: 06/03/2020 PCP: Horald Pollen, MD   Brief Narrative:  HPI On 06/03/2020 by Ms. Merlene Laughter, NP (PCCM) Kathleen Cameron is a 55 y.o. with a past medical history significant for seizure disorder, hypertension, severe COPD, acute on chronic hypoxic and hypercapnic respiratory failure, and anxiety who presented to the ED via EMS with acute on chronic hypoxic and hypercapnic respiratory failure.  Of [facility]  Primary history obtained from chart review as patient is currently on ventilator through chronic trach.  It appears patient was treated at this facility November 2021 for acute on chronic hypoxic respiratory failure she required endotracheal intubation and eventual tracheostomy due to difficulty weaning 03/02/2020.  On 11[facility]  Upon discharge home per chart review it appears patient's home ventilator malfunction much with resulted in respiratory failure and need for bag mask ventilation.  On arrival to ED patient was placed on ventilator with home vent settings with no further acute respiratory compromise.  PCCM was called for further management and admission.   Interim history Patient was admitted with acute respiratory distress after home ventilator failed.  She is admitted with acute on chronic hypoxic and hypercapnic respiratory failure, COPD.  Patient was also found to have a left subclavian DVT, and is since placed on Eliquis.  She continues to need ventilator assistance.  Patient with further anemia and requiring blood transfusion. Will hold Eliquis and place on heparin. GI consulted as FOBT +. Assessment & Plan   Acute on chronic hypoxic and hypercapnic respiratory failure secondary to COPD exacerbation -Currently requiring home mechanical ventilation.  Patient was initially admitted in  November 2021 for respiratory failure and required intubation at that time was unable to wean and had tracheostomy placed.  [facility] then was discharged home in February 2022. -Currently on full ventilator support -Patient was placed on prednisone for 5 days -Continue Brovana, Pulmicort, Yupelri nebs  Severe sepsis -Present on admission -Suspect secondary to pneumonia- VAP vs HAP -Patient was placed on antibiotics and completed 7 days -Blood cultures showed staph hominis bacteremia which was presumed to be a contamination-patient was placed on Vancomycin and cefepime  Provoked left subclavian DVT/edema -Patient was placed on Eliquis, will require this for 3 months through 09/09/2020 -Patient was noted to have low-grade fevers or tachycardia -Doppler was done on 06/12/20 showing left subclavian DVT -Was placed on doxycycline and completed course -Have held Eliquis and placed on heparin  Normocytic anemia/GI bleed versus anemia of acute illness -Hemoglobin baseline approximately 7-8 -No evidence of bleeding -Hemoglobin had dropped to 6.6, patient has received 2 units PRBC during this hospitalization -Given drop in hemoglobin FOBT and anemia panel were obtained.  FOBT was positive.  Anemia panel does show iron 17, ferritin 116, saturation ratio 5 -Of note, patient did have a positive FOBT back in January 2022 -Patient is currently on Eliquis for DVT.  Have held Eliquis and will place on heparin  -Gastroenterology consulted and appreciated, planning for EGD and colonoscopy today -Hemoglobin currently 8.7 -Continue to monitor CBC  Essential hypertension -Will restart amlodipine today and continue to monitor  Hypothyroidism -Continue Synthroid  Acute metabolic encephalopathy/anxiety -Patient appears to be alert and oriented.  Suspect this is a resolved -Continue clonazepam, temazepam, venlafaxine  Right lower extremity pain -X-rays unremarkable -No  edema or erythema on examination -Continue  pain control with gabapentin, oxycodone  Nutritional needs -Patient has peg placed and currently on tube feeds as well as a regular diet.  Elevated transaminases -Suspect secondary to sepsis -Resolved  DVT Prophylaxis  Eliquis--> heparin  Code Status: Full  Family Communication: None at bedside  Disposition Plan:  Status is: Inpatient  Remains inpatient appropriate because:Unsafe d/c plan and Inpatient level of care appropriate due to severity of illness.  Hospitalization complicated by anemia as well as positive FOBT.  Pending EGD and colonoscopy as well as stabilization hemoglobin.  Dispo: The patient is from: Home              Anticipated d/c is to: Home with home health services              Patient currently is not medically stable to d/c.   Difficult to place patient No   Consultants PCCM Gastroenterology  Procedures  Upper ext dopper Pending colonoscopy and EGD  Antibiotics   Anti-infectives (From admission, onward)   Start     Dose/Rate Route Frequency Ordered Stop   06/19/20 1030  doxycycline (VIBRA-TABS) tablet 100 mg  Status:  Discontinued        100 mg Per Tube Every 12 hours 06/19/20 0942 06/21/20 0901   06/03/20 2000  vancomycin (VANCOREADY) IVPB 500 mg/100 mL  Status:  Discontinued        500 mg 100 mL/hr over 60 Minutes Intravenous Every 12 hours 06/03/20 0340 06/03/20 0827   06/03/20 0400  vancomycin (VANCOREADY) IVPB 1000 mg/200 mL        1,000 mg 200 mL/hr over 60 Minutes Intravenous  Once 06/03/20 0340 06/03/20 0528   06/03/20 0400  ceFEPIme (MAXIPIME) 2 g in sodium chloride 0.9 % 100 mL IVPB        2 g 200 mL/hr over 30 Minutes Intravenous Every 8 hours 06/03/20 0340 06/09/20 0630      Subjective:   [name] seen and examined today.  Patient with no complaints today.  Feeling very sleepy and tired.  Denies chest pain or shortness of breath, abdominal pain, nausea or vomiting, dizziness or  headache.  Objective:   Vitals:   06/26/20 0731 06/26/20 0755 06/26/20 0800 06/26/20 0900  BP:   (!) 139/98 (!) 153/89  Pulse: 88  89 83  Resp: $Remo'18  16 16  'SAlVJ$ Temp:  (!) 97.5 F (36.4 C)    TempSrc:  Oral    SpO2: 99%  100% 100%  Weight:      Height:        Intake/Output Summary (Last 24 hours) at 06/26/2020 1000 Last data filed at 06/26/2020 0800 Gross per 24 hour  Intake 4573.99 ml  Output 1520 ml  Net 3053.99 ml   Filed Weights   06/25/20 0500 06/25/20 0900 06/26/20 0409  Weight: 57.3 kg 57.3 kg 57.7 kg   Exam  General: Well developed, chronically ill-appearing, NAD  HEENT: NCAT, mucous membranes moist.   Neck: neck  Cardiovascular: S1 S2 auscultated, RRR, cannot appreciate murmur  Respiratory: Diminished breath sounds anteriorly  Abdomen: Soft, nontender, nondistended, + bowel sounds  Extremities: warm dry without cyanosis clubbing.  UE and LE edema bilaterally  Neuro: AAOx3, nonfocal, can move all extremities and answer questions appropriately  Psych: does not, appropriate mood and affect  Data Reviewed: I have personally reviewed following labs and imaging studies  CBC: Recent Labs  Lab 06/21/20 0224 06/21/20 1328 06/22/20 1146 06/23/20 0233 06/24/20 0146 06/25/20 0115 06/25/20 1930 06/26/20 0356  WBC 8.9  --  8.4  --   --   --   --  8.8  HGB 6.6*   < > 8.3* 7.6* 7.3* 6.6* 9.8* 8.7*  HCT 22.3*   < > 26.0* 23.9* 24.3* 21.7* 31.1* 27.3*  MCV 94.9  --  90.9  --   --   --   --  90.4  PLT 268  --  247  --   --   --   --  230   < > = values in this interval not displayed.   Basic Metabolic Panel: Recent Labs  Lab 06/21/20 0224 06/22/20 1146 06/23/20 0233 06/26/20 0356  NA 141 142 140 142  K 4.3 3.9 3.7 4.0  CL 98 97* 97* 98  CO2 33* 37* 32 36*  GLUCOSE 149* 101* 79 124*  BUN 23* 26* 23* 19  CREATININE 0.39* 0.35* 0.32* 0.41*  CALCIUM 9.0 9.2 9.1 9.4  MG 1.8  --  1.8  --   PHOS 3.4  --   --   --    GFR: Estimated Creatinine Clearance: 67.8  mL/min (A) (by C-G formula based on SCr of 0.41 mg/dL (L)). Liver Function Tests: No results for input(s): AST, ALT, ALKPHOS, BILITOT, PROT, ALBUMIN in the last 168 hours. No results for input(s): LIPASE, AMYLASE in the last 168 hours. No results for input(s): AMMONIA in the last 168 hours. Coagulation Profile: No results for input(s): INR, PROTIME in the last 168 hours. Cardiac Enzymes: No results for input(s): CKTOTAL, CKMB, CKMBINDEX, TROPONINI in the last 168 hours. BNP (last 3 results) No results for input(s): PROBNP in the last 8760 hours. HbA1C: No results for input(s): HGBA1C in the last 72 hours. CBG: Recent Labs  Lab 06/25/20 2349 06/26/20 0358 06/26/20 0753 06/26/20 0820 06/26/20 0916  GLUCAP 86 107* 62* 61* 110*   Lipid Profile: No results for input(s): CHOL, HDL, LDLCALC, TRIG, CHOLHDL, LDLDIRECT in the last 72 hours. Thyroid Function Tests: No results for input(s): TSH, T4TOTAL, FREET4, T3FREE, THYROIDAB in the last 72 hours. Anemia Panel: Recent Labs    06/24/20 0707  VITAMINB12 718  FOLATE 22.3  FERRITIN 116  TIBC 326  IRON 17*  RETICCTPCT 3.2*   Urine analysis:    Component Value Date/Time   COLORURINE YELLOW 03/20/2020 0018   APPEARANCEUR HAZY (A) 03/20/2020 0018   LABSPEC 1.006 03/20/2020 0018   PHURINE 5.0 03/20/2020 0018   GLUCOSEU NEGATIVE 03/20/2020 0018   HGBUR NEGATIVE 03/20/2020 0018   BILIRUBINUR NEGATIVE 03/20/2020 0018   KETONESUR NEGATIVE 03/20/2020 0018   PROTEINUR NEGATIVE 03/20/2020 0018   UROBILINOGEN 1.0 11/12/2008 1440   NITRITE NEGATIVE 03/20/2020 0018   LEUKOCYTESUR NEGATIVE 03/20/2020 0018   Sepsis Labs: $RemoveBefo'@[email or web address]'[email or web address]$ (procalcitonin:4,lacticidven:4)  )No results found for this or any previous visit (from the past 240 hour(s)).    Radiology Studies: No results found.   Scheduled Meds: . sodium chloride   Intravenous Once  . sodium chloride   Intravenous Once  . arformoterol  15 mcg Nebulization BID  . aspirin  81 mg  Per Tube Daily  . budesonide  0.25 mg Nebulization BID  . chlorhexidine gluconate (MEDLINE KIT)  15 mL Mouth Rinse BID  . Chlorhexidine Gluconate Cloth  6 each Topical Q0600  . clonazePAM  1 mg Per Tube BID  . colchicine  0.6 mg Per Tube Daily  . feeding supplement  237 mL Oral TID BM  . feeding supplement (OSMOLITE 1.5 CAL)  237 mL Per Tube BID  .  feeding supplement (PROSource TF)  45 mL Per Tube BID  . free water  160 mL Per Tube TID BM  . insulin aspart  0-9 Units Subcutaneous Q4H  . levothyroxine  50 mcg Per Tube Q0600  . mouth rinse  15 mL Mouth Rinse 10 times per day  . multivitamin with minerals  1 tablet Per Tube Daily  . pantoprazole (PROTONIX) IV  40 mg Intravenous Q12H  . revefenacin  175 mcg Nebulization Daily  . rosuvastatin  40 mg Per Tube Daily  . temazepam  15 mg Per Tube QHS  . venlafaxine  37.5 mg Per Tube BID WC   Continuous Infusions: . sodium chloride Stopped (06/11/20 1143)  . sodium chloride 20 mL/hr (06/26/20 0927)  . heparin 950 Units/hr (06/26/20 0800)     LOS: 23 days   Time Spent in minutes   30 minutes  Craigory Toste D.O. on 06/26/2020 at 10:00 AM  Between 7am to 7pm - Please see pager noted on [email or web address]  After 7pm go to [email or web address]  And look for the night coverage person covering for me after hours  [facility] Office  [phone]

## 2020-06-26 NOTE — Op Note (Addendum)
[facility] Patient Name: Kathleen Cameron Procedure Date : 06/26/2020 MRN: [id number] Attending MD: Jeani Hawking , MD Date of Birth: 1966/02/09 CSN: [id number] Age: 55 Admit Type: Inpatient Procedure:                Upper GI endoscopy Indications:              Iron deficiency anemia Providers:                Jeani Hawking, MD, Rogue Jury, RN, Charlett Lango,                            RN, Leanne Lovely, Technician Referring MD:              Medicines:                Fentanyl 100 micrograms IV, Midazolam 4 mg IV Complications:            No immediate complications. Estimated Blood Loss:     Estimated blood loss: none. Procedure:                Pre-Anesthesia Assessment:                           - Prior to the procedure, a History and Physical                            was performed, and patient medications and                            allergies were reviewed. The patient's tolerance of                            previous anesthesia was also reviewed. The risks                            and benefits of the procedure and the sedation                            options and risks were discussed with the patient.                            All questions were answered, and informed consent                            was obtained. Prior Anticoagulants: The patient has                            taken heparin, last dose was day of procedure. ASA                            Grade Assessment: III - A patient with severe                            systemic disease. After reviewing the risks and  benefits, the patient was deemed in satisfactory                            condition to undergo the procedure.                           - Sedation was administered by an endoscopy nurse.                            The sedation level attained was moderate.                           After obtaining informed consent, the endoscope was                             passed under direct vision. Throughout the                            procedure, the patient's blood pressure, pulse, and                            oxygen saturations were monitored continuously. The                            PCF-H190DL ([id number]) Olympus pediatric colonscope                            was introduced through the mouth, and advanced to                            the fourth part of duodenum. The upper GI endoscopy                            was accomplished without difficulty. The patient                            tolerated the procedure well. Scope In: Scope Out: Findings:      The esophagus was normal.      The stomach was normal.      Two 1 mm angiodysplastic lesions with bleeding were found in the second       portion of the duodenum. Coagulation for hemostasis using monopolar       probe was successful. For hemostasis, two hemostatic clips were       successfully placed (MR conditional). There was no bleeding at the end       of the procedure.      Two actively bleeding AVMs were noted in the second portion of the       duodenum. There was evidence of fresh blood and with washing two       moderately-oozing sites were found and ablated with APC. Two hemoclips       were deployed. Impression:               - Normal esophagus.                           -  Normal stomach.                           - Two bleeding angiodysplastic lesions in the                            duodenum. Treated with a monopolar probe. Clips (MR                            conditional) were placed.                           - No specimens collected. Recommendation:           - Proceed with the colonoscopy. Procedure Code(s):        --- Professional ---                           [phone], Esophagogastroduodenoscopy, flexible,                            transoral; with control of bleeding, any method Diagnosis Code(s):        --- Professional ---                           K31.811,  Angiodysplasia of stomach and duodenum                            with bleeding                           D50.9, Iron deficiency anemia, unspecified CPT copyright 2019 American [employer] Association. All rights reserved. The codes documented in this report are preliminary and upon coder review may  be revised to meet current compliance requirements. Jeani Hawking, MD Jeani Hawking, MD 06/26/2020 7:27:23 PM This report has been signed electronically. Number of Addenda: 0

## 2020-06-26 NOTE — Op Note (Signed)
[facility] Patient Name: Kathleen Cameron Procedure Date : 06/26/2020 MRN: [id number] Attending MD: Jeani Hawking , MD Date of Birth: Dec 21, 1965 CSN: [id number] Age: 55 Admit Type: Inpatient Procedure:                Colonoscopy Indications:              Iron deficiency anemia Providers:                Jeani Hawking, MD, Rogue Jury, RN, Charlett Lango,                            RN, Leanne Lovely, Technician Referring MD:              Medicines:                Fentanyl 100 micrograms IV, Midazolam 4 mg IV Complications:            No immediate complications. Estimated Blood Loss:     Estimated blood loss was minimal. Procedure:                Pre-Anesthesia Assessment:                           - Prior to the procedure, a History and Physical                            was performed, and patient medications and                            allergies were reviewed. The patient's tolerance of                            previous anesthesia was also reviewed. The risks                            and benefits of the procedure and the sedation                            options and risks were discussed with the patient.                            All questions were answered, and informed consent                            was obtained. Prior Anticoagulants: The patient has                            taken heparin, last dose was day of procedure. ASA                            Grade Assessment: III - A patient with severe                            systemic disease. After reviewing the risks and  benefits, the patient was deemed in satisfactory                            condition to undergo the procedure.                           - Sedation was administered by an endoscopy nurse.                            The sedation level attained was moderate.                           After obtaining informed consent, the colonoscope                             was passed under direct vision. Throughout the                            procedure, the patient's blood pressure, pulse, and                            oxygen saturations were monitored continuously. The                            PCF-H190DL ([id number]) Olympus pediatric colonscope                            was introduced through the anus and advanced to the                            the cecum, identified by appendiceal orifice and                            ileocecal valve. The colonoscopy was technically                            difficult and complex due to significant looping.                            Successful completion of the procedure was aided by                            straightening and shortening the scope to obtain                            bowel loop reduction. The patient tolerated the                            procedure well. The quality of the bowel                            preparation was adequate. The ileocecal valve,  appendiceal orifice, and rectum were photographed. Scope In: 4:52:11 PM Scope Out: 5:20:39 PM Scope Withdrawal Time: 0 hours 21 minutes 4 seconds  Total Procedure Duration: 0 hours 28 minutes 28 seconds  Findings:      Two semi-sessile polyps were found in the transverse colon and cecum.       The polyps were 5 to 8 mm in size. These polyps were removed with a hot       snare. Resection was complete, but the polyp tissue was only partially       retrieved. To stop active bleeding, five hemostatic clips were       successfully placed (MR conditional). There was no bleeding at the end       of the procedure. Impression:               - Two 5 to 8 mm polyps in the transverse colon and                            in the cecum, removed with a hot snare. Complete                            resection. Partial retrieval. Clips (MR                            conditional) were placed. Recommendation:           - Return patient  to hospital ward for ongoing care.                           - Resume regular diet.                           - Continue present medications.                           - Okay to resume heparin, but minimize the dosing.                            It is felt that the consequences of her thrombosis                            outweigh the risk for bleeding. Procedure Code(s):        --- Professional ---                           [id number], Colonoscopy, flexible; with removal of                            tumor(s), polyp(s), or other lesion(s) by snare                            technique Diagnosis Code(s):        --- Professional ---                           K63.5, Polyp of colon  D50.9, Iron deficiency anemia, unspecified CPT copyright 2019 American Medical Association. All rights reserved. The codes documented in this report are preliminary and upon coder review may  be revised to meet current compliance requirements. Jeani Hawking, MD Jeani Hawking, MD 06/26/2020 7:25:42 PM This report has been signed electronically. Number of Addenda: 0

## 2020-06-26 NOTE — Progress Notes (Signed)
ANTICOAGULATION CONSULT NOTE - Follow Up Consult  Pharmacy Consult for heparin Indication: DVT  Labs: Recent Labs    06/25/20 0115 06/25/20 1930 06/25/20 2027 06/26/20 0356  HGB 6.6* 9.8*  --  8.7*  HCT 21.7* 31.1*  --  27.3*  PLT  --   --   --  230  APTT  --   --  61* 81*  HEPARINUNFRC  --   --  1.22* 0.77*  CREATININE  --   --   --  0.41*    Assessment/Plan:  55yo female therapeutic on heparin after rate change. Will continue gtt at current rate of 950 units/hr and confirm stable with additional PTT.   Vernard Gambles, PharmD, BCPS  06/26/2020,5:05 AM

## 2020-06-26 NOTE — Progress Notes (Addendum)
ANTICOAGULATION CONSULT NOTE  Pharmacy Consult for Heparin Indication: DVT  Allergies  Allergen Reactions  . Stiolto Respimat [Tiotropium Bromide-Olodaterol] Other (See Comments)    Severe headaches and rapid heartrate    Patient Measurements: Height: 5\' 4"  (162.6 cm) Weight: 57.7 kg (127 lb 3.3 oz) IBW/kg (Calculated) : 54.7 Heparin Dosing Weight: 57.3 kg   Vital Signs: Temp: 98.6 F (37 C) (03/08 1122) Temp Source: Oral (03/08 1122) BP: 112/88 (03/08 1100) Pulse Rate: 85 (03/08 1100)  Labs: Recent Labs    06/25/20 0115 06/25/20 1930 06/25/20 2027 06/26/20 0356 06/26/20 1134  HGB 6.6* 9.8*  --  8.7*  --   HCT 21.7* 31.1*  --  27.3*  --   PLT  --   --   --  230  --   APTT  --   --  61* 81* 86*  HEPARINUNFRC  --   --  1.22* 0.77*  --   CREATININE  --   --   --  0.41*  --     Estimated Creatinine Clearance: 67.8 mL/min (A) (by C-G formula based on SCr of 0.41 mg/dL (L)).   Medical History: Past Medical History:  Diagnosis Date  . Acute on chronic respiratory failure with hypoxia (HCC)   . Anxiety disorder   . Asthma   . COPD (chronic obstructive pulmonary disease) (HCC)   . COPD, severe (HCC)   . Hypertension   . Seizure disorder [facility])     Assessment: 55 yo female admitted on 06/03/2020 with acute respiratory distress after home ventilator failed. 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 Today, hgb 6.6. Pharmacy consulted to start heparin due to concern of drop in hemoglobin. FOBT positive 3/6. GI consulted. Spoke with RN and no bleeding noted. Last dose of apixaban 3/6 at 2100.   Heparin level still falsely elevated from previous apixaban use. aPTT 86 on heparin 950 units/hr is slightly above goal of 66-85. Spoke with phlebotomy and aPTT drawn from R hand with heparin infusing in R PIV due to phlebotomy unable to obtain blood from L hand per policy with clot. Not ideal collection method but was drawn below heparin  infusion. No bleeding noted.   Goal of Therapy:  Heparin level 0.3 - 0.5 units/ml; No bolus APTT 66-85 Monitor platelets by anticoagulation protocol: Yes   Plan:  Heparin on hold starting at 1230 for EGD/colonoscopy at 1630  Follow up plans for heparin after - when resumed per GI recs would resume at 950 units/hr and obtain 6 hr aPTT given only slight above goal and no noted bleeding Monitor via aPTT until heparin level and aPTT correlating due to previous apixaban effecting heparin level Monitor aPTT, heparin level, CBC, and s/s of bleeding daily    2101, PharmD Clinical Pharmacist  06/26/2020 12:51 PM

## 2020-06-26 NOTE — Progress Notes (Signed)
ANTICOAGULATION CONSULT NOTE  Pharmacy Consult for Heparin Indication: DVT  Allergies  Allergen Reactions  . Stiolto Respimat [Tiotropium Bromide-Olodaterol] Other (See Comments)    Severe headaches and rapid heartrate    Patient Measurements: Height: 5\' 4"  (162.6 cm) Weight: 57.7 kg (127 lb 3.3 oz) IBW/kg (Calculated) : 54.7 Heparin Dosing Weight: 57.3 kg   Vital Signs: Temp: 98.6 F (37 C) (03/08 1122) Temp Source: Oral (03/08 1122) BP: 148/77 (03/08 1725) Pulse Rate: 92 (03/08 1725)  Labs: Recent Labs    06/25/20 0115 06/25/20 1930 06/25/20 2027 06/26/20 0356 06/26/20 1134  HGB 6.6* 9.8*  --  8.7*  --   HCT 21.7* 31.1*  --  27.3*  --   PLT  --   --   --  230  --   APTT  --   --  61* 81* 86*  HEPARINUNFRC  --   --  1.22* 0.77*  --   CREATININE  --   --   --  0.41*  --     Estimated Creatinine Clearance: 67.8 mL/min (A) (by C-G formula based on SCr of 0.41 mg/dL (L)).   Assessment: 55 yo female admitted on 06/03/2020 with acute respiratory distress after home ventilator failed. 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  Pharmacy consulted to dose heparin due to concern of drop in hemoglobin. FOBT positive 3/6.  Last dose of apixaban 3/6 at 2100.   S/p EGD/colonoscopy; note pending.  Pharmacy consulted to resume IV heparin and aim for the low goal.  Goal of Therapy:  Heparin level 0.3 - 0.5 units/ml APTT 66-85 Monitor platelets by anticoagulation protocol: Yes   Plan:  Restart IV heparin at 950 units/hr - no bolus Check 6 hr aPTT Monitor closely for s/sx of bleeding  Errica Dutil D. 05-28-1982, PharmD, BCPS, BCCCP 06/26/2020, 5:53 PM

## 2020-06-27 ENCOUNTER — Inpatient Hospital Stay (HOSPITAL_COMMUNITY): Payer: No Typology Code available for payment source

## 2020-06-27 ENCOUNTER — Encounter (HOSPITAL_COMMUNITY): Payer: Self-pay | Admitting: Gastroenterology

## 2020-06-27 DIAGNOSIS — J9622 Acute and chronic respiratory failure with hypercapnia: Secondary | ICD-10-CM | POA: Diagnosis not present

## 2020-06-27 DIAGNOSIS — J9621 Acute and chronic respiratory failure with hypoxia: Secondary | ICD-10-CM | POA: Diagnosis not present

## 2020-06-27 LAB — HEPARIN LEVEL (UNFRACTIONATED)
Heparin Unfractionated: 0.35 IU/mL (ref 0.30–0.70)
Heparin Unfractionated: 0.3 IU/mL (ref 0.30–0.70)

## 2020-06-27 LAB — CBC
HCT: 25.3 % — ABNORMAL LOW (ref 36.0–46.0)
Hemoglobin: 8.1 g/dL — ABNORMAL LOW (ref 12.0–15.0)
MCH: 29 pg (ref 26.0–34.0)
MCHC: 32 g/dL (ref 30.0–36.0)
MCV: 90.7 fL (ref 80.0–100.0)
Platelets: 244 10*3/uL (ref 150–400)
RBC: 2.79 MIL/uL — ABNORMAL LOW (ref 3.87–5.11)
RDW: 16.6 % — ABNORMAL HIGH (ref 11.5–15.5)
WBC: 6.9 10*3/uL (ref 4.0–10.5)
nRBC: 0 % (ref 0.0–0.2)

## 2020-06-27 LAB — BLOOD GAS, ARTERIAL
Acid-Base Excess: 8.5 mmol/L — ABNORMAL HIGH (ref 0.0–2.0)
Bicarbonate: 33.8 mmol/L — ABNORMAL HIGH (ref 20.0–28.0)
FIO2: 40
O2 Saturation: 98.1 %
Patient temperature: 36.8
pCO2 arterial: 58.4 mmHg — ABNORMAL HIGH (ref 32.0–48.0)
pH, Arterial: 7.379 (ref 7.350–7.450)
pO2, Arterial: 104 mmHg (ref 83.0–108.0)

## 2020-06-27 LAB — GLUCOSE, CAPILLARY
Glucose-Capillary: 106 mg/dL — ABNORMAL HIGH (ref 70–99)
Glucose-Capillary: 78 mg/dL (ref 70–99)
Glucose-Capillary: 97 mg/dL (ref 70–99)
Glucose-Capillary: 109 mg/dL — ABNORMAL HIGH (ref 70–99)
Glucose-Capillary: 77 mg/dL (ref 70–99)
Glucose-Capillary: 87 mg/dL (ref 70–99)

## 2020-06-27 LAB — BASIC METABOLIC PANEL
Anion gap: 9 (ref 5–15)
Anion gap: 10 (ref 5–15)
BUN: 17 mg/dL (ref 6–20)
BUN: 17 mg/dL (ref 6–20)
CO2: 30 mmol/L (ref 22–32)
CO2: 31 mmol/L (ref 22–32)
Calcium: 9.2 mg/dL (ref 8.9–10.3)
Calcium: 9.4 mg/dL (ref 8.9–10.3)
Chloride: 101 mmol/L (ref 98–111)
Chloride: 101 mmol/L (ref 98–111)
Creatinine, Ser: 0.39 mg/dL — ABNORMAL LOW (ref 0.44–1.00)
Creatinine, Ser: 0.38 mg/dL — ABNORMAL LOW (ref 0.44–1.00)
GFR, Estimated: >60 mL/min (ref >60)
GFR, Estimated: >60 mL/min (ref >60)
Glucose, Bld: 125 mg/dL — ABNORMAL HIGH (ref 70–99)
Glucose, Bld: 100 mg/dL — ABNORMAL HIGH (ref 70–99)
Potassium: 3.4 mmol/L — ABNORMAL LOW (ref 3.5–5.1)
Potassium: 3.7 mmol/L (ref 3.5–5.1)
Sodium: 140 mmol/L (ref 135–145)
Sodium: 142 mmol/L (ref 135–145)

## 2020-06-27 LAB — TROPONIN I (HIGH SENSITIVITY)
Troponin I (High Sensitivity): 6 ng/L (ref <18)
Troponin I (High Sensitivity): 7 ng/L (ref <18)

## 2020-06-27 LAB — MAGNESIUM: Magnesium: 1.9 mg/dL (ref 1.7–2.4)

## 2020-06-27 LAB — APTT
aPTT: 84 seconds — ABNORMAL HIGH (ref 24–36)
aPTT: 90 seconds — ABNORMAL HIGH (ref 24–36)

## 2020-06-27 MED ORDER — METOPROLOL TARTRATE 5 MG/5ML IV SOLN
5.0000 mg | INTRAVENOUS | Status: DC | PRN
  Filled 2020-06-27: qty 5

## 2020-06-27 MED ORDER — METOPROLOL TARTRATE 5 MG/5ML IV SOLN
INTRAVENOUS | Status: AC
  Administered 2020-06-27: 5 mg
  Filled 2020-06-27: qty 5

## 2020-06-27 MED ORDER — POTASSIUM CHLORIDE CRYS ER 20 MEQ PO TBCR: 30.0000 meq | EXTENDED_RELEASE_TABLET | Freq: Once | ORAL | Status: DC

## 2020-06-27 MED ORDER — POTASSIUM CHLORIDE 20 MEQ PO PACK
40.0000 meq | PACK | Freq: Once | ORAL | Status: AC
  Administered 2020-06-27: 40 meq
  Filled 2020-06-27: qty 2

## 2020-06-27 MED ORDER — POTASSIUM CHLORIDE 20 MEQ PO PACK
20.0000 meq | PACK | Freq: Once | ORAL | Status: AC
  Administered 2020-06-27: 20 meq
  Filled 2020-06-27: qty 1

## 2020-06-27 MED ORDER — FREE WATER
160.0000 mL | Freq: Two times a day (BID) | Status: DC
  Administered 2020-06-27 – 2020-07-02 (×10): 160 mL

## 2020-06-27 NOTE — Progress Notes (Signed)
eLink Physician-Brief Progress Note Patient Name: Kathleen Cameron DOB: Aug 26, 1965 MRN: [id number]   Date of Service  06/27/2020  HPI/Events of Note  Patient c/o SOB. Patient on 40%/AC 16/TV 480/P 5. Sat = 94%  eICU Interventions  Plan: 1. Portable CXR STAT. 2. ABG STAT.     Intervention Category Major Interventions: Respiratory failure - evaluation and management  Sommer,Steven Eugene 06/27/2020, 10:04 PM

## 2020-06-27 NOTE — Progress Notes (Signed)
At approximately 3pm, patient became short of breath necessitating respiratory to increase settings on home vent, give a breathing treatment and suctioning. Shortly after HR increased to 110's and 120's rapidly. This was sustained for a period of 25-74mins, patient also complained of chest pain, BP was elevated, O2 sats remained stable and patient was communicating well with staff members -during which  Dr. Alvino Chapel was paged, orders for metoprolol, troponin level and stat EKG obtained. HR was decreased to 90-95 following metoprolol, pain med. Given for discomfort as well. Will continue to monitor for rebound increased HR or chest pain, no c/o discomfort at present.

## 2020-06-27 NOTE — Progress Notes (Addendum)
Nutrition Follow-up  DOCUMENTATION CODES:   Non-severe (moderate) malnutrition in context of chronic illness  INTERVENTION:   Continue bolus TF regimen via PEG:  Osmolite 1.5 237 (1 carton ) BID between meals  Prosource TF 45 ml BID  Provides 790 kcal and 52 gm protein per day  Free water flushes 80 ml before and after each bolus  MVI with minerals daily via tube  Continue to offer Ensure Enlive po TID, each supplement provides 350 kcal and 20 grams of protein  NUTRITION DIAGNOSIS:   Moderate Malnutrition related to chronic illness (COPD) as evidenced by mild muscle depletion,mild fat depletion.  Ongoing   GOAL:   Patient will meet greater than or equal to 90% of their needs  Met   MONITOR:   PO intake,Supplement acceptance,Labs,Weight trends,TF tolerance,Skin  REASON FOR ASSESSMENT:   Consult Enteral/tube feeding initiation and management,Calorie Count  ASSESSMENT:   55 yo female admitted from home with ventilator malfunction. She was discharged home from [place] and required re-admission to [facility] on the same day. PMH includes HTN, asthma, COPD, seizure disorder, anxiety disorder.  Patient was NPO 3/8 for upper endoscopy and clipping of bleeding duodenal lesions.  Currently on a regular diet.  Meal intakes documented at 0-50% for the past few days. She is drinking Ensure Enlive supplements 2-3 times per day for an additional [phone] kcal and 40-60 gm protein per day.  Receiving bolus tube feeding via PEG: Osmolite 1.5 237 ml BID, Prosource TF 45 ml BID, providing 790 kcal, 52 gm protein daily. Free water flushes 80 ml before and after each bolus feeding.   Intake of supplements, meals, and bolus feedings is meeting nutrition needs. Oral intake is not meeting nutrition needs alone. Will need to continue bolus tube feedings.  Labs reviewed. K 3.4 CBG: 106-78-97  Medications reviewed and include Novolog SSI, MVI with minerals.    Diet Order:   Diet  Order            Diet regular Room service appropriate? Yes; Fluid consistency: Thin  Diet effective now                 EDUCATION NEEDS:   Not appropriate for education at this time  Skin:  Skin Assessment: Skin Integrity Issues: Skin Integrity Issues:: Stage II Stage II: R buttocks, R ankle  Last BM:  3/8  Height:   Ht Readings from Last 1 Encounters:  06/25/20 _0  (1.626 m)    Weight:   Wt Readings from Last 1 Encounters:  06/27/20 57.4 kg    BMI:  Body mass index is 21.72 kg/m.  Estimated Nutritional Needs:   Kcal:  1550-1750  Protein:  80-95 gm  Fluid:  >/= 1.5 L    Lucas Mallow, RD, LDN, CNSC Please refer to Amion for contact information.

## 2020-06-27 NOTE — Progress Notes (Addendum)
ANTICOAGULATION CONSULT NOTE  Pharmacy Consult for Heparin Indication: DVT  Allergies  Allergen Reactions  . Stiolto Respimat [Tiotropium Bromide-Olodaterol] Other (See Comments)    Severe headaches and rapid heartrate    Patient Measurements: Height: 5\' 4"  (162.6 cm) Weight: 57.4 kg (126 lb 8.7 oz) IBW/kg (Calculated) : 54.7 Heparin Dosing Weight: 57.3 kg   Vital Signs: Temp: 98.4 F (36.9 C) (03/09 0748) Temp Source: Oral (03/09 0748) BP: 135/89 (03/09 1000) Pulse Rate: 111 (03/09 1000)  Labs: Recent Labs    06/25/20 1930 06/25/20 2027 06/26/20 0356 06/26/20 1134 06/27/20 0111 06/27/20 0915  HGB 9.8*  --  8.7*  --  8.1*  --   HCT 31.1*  --  27.3*  --  25.3*  --   PLT  --   --  230  --  244  --   APTT  --    < > 81* 86* 84*  --   HEPARINUNFRC  --    < > 0.77*  --  0.35 0.30  CREATININE  --   --  0.41*  --  0.39*  --    < > = values in this interval not displayed.    Estimated Creatinine Clearance: 67.8 mL/min (A) (by C-G formula based on SCr of 0.39 mg/dL (L)).   Medical History: Past Medical History:  Diagnosis Date  . Acute on chronic respiratory failure with hypoxia (HCC)   . Anxiety disorder   . Asthma   . COPD (chronic obstructive pulmonary disease) (HCC)   . COPD, severe (HCC)   . Hypertension   . Seizure disorder [facility])     Assessment: 55 yo female admitted on 06/03/2020 with acute respiratory distress after home ventilator failed. 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 Today, hgb 6.6. Pharmacy consulted to start heparin due to concern of drop in hemoglobin. FOBT positive 3/6. Last dose of apixaban 3/6 at 2100. Patient underwent EGD/colonoscopy on 3/8 with 2 bleeding lesions in duodenum clipped and 2 polyps removed. Heparin resumed after procedure.   Heparin level 0.3 and aPTT 90 on heparin 950 units/hr. Will monitor by heparin levels. Hgb 8.1. Plt wnl. No noted bleeding   Goal of Therapy:  Heparin level  0.3 - 0.5 units/ml; No bolus Monitor platelets by anticoagulation protocol: Yes   Plan:  Continue heparin 950 units/hr - next level tomorrow morning since therapeutic x2 Monitor via heparin levels now - discontinue aPTT monitoring  Monitor heparin level, CBC and s/s of bleeding daily  Follow up GI and TRH recommendations for anticoagulation  5/8, PharmD Clinical Pharmacist  06/27/2020 11:01 AM

## 2020-06-27 NOTE — TOC Progression Note (Signed)
Transition of Care [facility]) - Progression Note    Patient Details  Name: Kathleen Cameron MRN: [id number] Date of Birth: Jun 30, 1965  Transition of Care [facility]) CM/SW Contact  Epifanio Lesches, RN Phone Number: 06/27/2020, 4:30 PM  Clinical Narrative:    NCM received call from [facility]/ Rob. Rob informed NCM they would not have RN to start care if d/c on tomorrow. States they don't start care on Fridays with new pts. The earliest [facility] will be Monday per liaison. Case discuss with TOC supervision ...   TOC team will continue to monitor and assist with needs   Expected Discharge Plan: Home w Home Health Services Barriers to Discharge: Continued Medical Work up  Expected Discharge Plan and Services Expected Discharge Plan: Home w Home Health Services   Discharge Planning Services: CM Consult Post Acute Care Choice: Home Health,Durable Medical Equipment Living arrangements for the past 2 months: Apartment                                       Social Determinants of Health (SDOH) Interventions    Readmission Risk Interventions Readmission Risk Prevention Plan 02/02/2020  Transportation Screening Complete  PCP or Specialist Appt within 3-5 Days Complete  HRI or Home Care Consult Complete  Social Work Consult for Recovery Care Planning/Counseling Complete  Palliative Care Screening Not Applicable  Medication Review [employer]) Complete  Some recent data might be hidden

## 2020-06-27 NOTE — Progress Notes (Signed)
ANTICOAGULATION CONSULT NOTE  Pharmacy Consult for Heparin Indication: DVT   Labs: Recent Labs    06/25/20 1930 06/25/20 1930 06/25/20 2027 06/26/20 0356 06/26/20 1134 06/27/20 0111  HGB 9.8*  --   --  8.7*  --  8.1*  HCT 31.1*  --   --  27.3*  --  25.3*  PLT  --   --   --  230  --  244  APTT  --    < > 61* 81* 86* 84*  HEPARINUNFRC  --   --  1.22* 0.77*  --   --   CREATININE  --   --   --  0.41*  --   --    < > = values in this interval not displayed.    Assessment: 55 yo female admitted on 06/03/2020 with acute respiratory distress after home ventilator failed. 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  Pharmacy consulted to dose heparin due to concern of drop in hemoglobin. FOBT positive 3/6.  Last dose of apixaban 3/6 at 2100.   S/p EGD/colonoscopy; note pending.  Pharmacy consulted to resume IV heparin and aim for the low goal. APTT 84 sec this am.  Goal of Therapy:  Heparin level 0.3 - 0.5 units/ml APTT 66-85 Monitor platelets by anticoagulation protocol: Yes   Plan:  Continue IV heparin at 950 units/hr Check HL and aPTT in 6-8 hours Monitor closely for s/sx of bleeding Thanks for allowing pharmacy to be a part of this patient's care.  Talbert Cage, PharmD Clinical Pharmacist  06/27/2020, 2:19 AM

## 2020-06-27 NOTE — Progress Notes (Signed)
Occupational Therapy Treatment Patient Details Name: Kathleen Cameron MRN: [id number] DOB: 1965/06/16 Today's Date: 06/27/2020    History of present illness 55 y.o. female admitted 2/13 from home with respiratory distress due to home vent malfunction after same day D/Cameron from [facility] with home vent. PMHx pt with Kathleen Cameron admission 11/6 with trach 03/02/20 ant transition to [facility] 11/18-2/12, 04/25/19 Gtube placed, COPD, hypertension, depression/anxiety, HLD, shingles infection,   OT comments  Pt progressing with OT session towards goals. ModA for EOB x32 mins  Bed mobility mostly for elevation and BLE maneuvering; set-upA for grooming and using built-up utensils for self feeding.  Pt performing BUE HEP in sitting at EOB with minA overall for balance. Pt progressing towards goals. VSS. FiO2 60%, PEEP 5. O2 >90% throughtout exertion. 95-100 BPM. Pt would benefit from continued OT skilled services. OT following acutely.    Follow Up Recommendations  Home health OT;Supervision/Assistance - 24 hour;Other (comment)    Equipment Recommendations  None recommended by OT    Recommendations for Other Services      Precautions / Restrictions Precautions Precautions: Fall;Other (comment) Precaution Comments: trach, on home vent Restrictions Weight Bearing Restrictions: No       Mobility Bed Mobility Overal bed mobility: Needs Assistance Bed Mobility: Supine to Sit;Sit to Supine     Supine to sit: Min assist;HOB elevated Sit to supine: Min assist   General bed mobility comments: sitting EOB x25 mins for dynamic sitting balance tasks    Transfers                 General transfer comment: DNT    Balance Overall balance assessment: Needs assistance Sitting-balance support: Feet unsupported;No upper extremity supported Sitting balance-Leahy Scale: Fair                                     ADL either performed or assessed with clinical judgement   ADL Overall  ADL's : Needs assistance/impaired Eating/Feeding: Set up;Sitting Eating/Feeding Details (indicate cue type and reason): sitting EOB and using built up utensils Grooming: Wash/dry face;Oral care;Set up                               Functional mobility during ADLs: Moderate assistance General ADL Comments: ModA for EOB; set-upA for grooming and using built-up utensils     Vision   Vision Assessment?: No apparent visual deficits   Perception     Praxis      Cognition Arousal/Alertness: Awake/alert Behavior During Therapy: WFL for tasks assessed/performed Overall Cognitive Status: Within Functional Limits for tasks assessed                                 General Comments: pt mouthing words, nodding and following commands. Trach and difficult to read lips at times        Exercises General Exercises - Upper Extremity Shoulder Flexion: Both;AROM;10 reps;Seated Elbow Flexion: Both;AROM;10 reps;Seated Elbow Extension: AAROM;Both;15 reps;Seated Digit Composite Flexion: AROM;Both;10 reps;Seated Composite Extension: Both;10 reps;AROM;Seated Other Exercises Other Exercises: isometric push/pull elbow flex/ext   Shoulder Instructions       General Comments VSS. FiO2 60%, PEEP 5. O2 >90% throughtout exertion. 95-100 BPM    Pertinent Vitals/ Pain       Pain Assessment: Faces Faces Pain Scale: Hurts a little bit  Pain Location: Bil feet Pain Descriptors / Indicators: Discomfort Pain Intervention(s): Monitored during session  Home Living                                          Prior Functioning/Environment              Frequency  Min 2X/week        Progress Toward Goals  OT Goals(current goals can now be found in the care plan section)  Progress towards OT goals: Progressing toward goals  Acute Rehab OT Goals Patient Stated Goal: strengthening to return home OT Goal Formulation: With patient/family Time For Goal  Achievement: 06/29/20 Potential to Achieve Goals: Fair ADL Goals Pt Will Perform Eating: with supervision;with set-up;with adaptive utensils Pt Will Perform Grooming: with set-up;with supervision;sitting Pt Will Perform Upper Body Bathing: with set-up;with supervision;sitting Pt/caregiver will Perform Home Exercise Program: Increased ROM;Both right and left upper extremity;With minimal assist;With written HEP provided Additional ADL Goal #1: Pt will able to sit EOB 10 minutes without LOB for basic ADLs Additional ADL Goal #2: Pt will able to actively reach to 90 degrees with Bil shoulders to grab items while seated EOB or in recliner  Plan Discharge plan remains appropriate    Co-evaluation                 AM-PAC OT "6 Clicks" Daily Activity     Outcome Measure   Help from another person eating meals?: A Little Help from another person taking care of personal grooming?: A Little Help from another person toileting, which includes using toliet, bedpan, or urinal?: Total Help from another person bathing (including washing, rinsing, drying)?: A Lot Help from another person to put on and taking off regular upper body clothing?: Total Help from another person to put on and taking off regular lower body clothing?: Total 6 Click Score: 11    End of Session Equipment Utilized During Treatment: Oxygen  OT Visit Diagnosis: Muscle weakness (generalized) (M62.81);Other abnormalities of gait and mobility (R26.89);Pain Pain - Right/Left: Left Pain - part of body: Ankle and joints of foot   Activity Tolerance Patient tolerated treatment well   Patient Left in bed;with call bell/phone within reach;with bed alarm set   Nurse Communication Mobility status        Time: 2979-8921 OT Time Calculation (min): 45 min  Charges: OT General Charges $OT Visit: 1 Visit OT Treatments $Self Care/Home Management : 23-37 mins $Therapeutic Activity: 8-22 mins  Kathleen Cameron, OTR/L Acute  Rehabilitation Services Pager: [phone] Office: [phone]    Kathleen Cameron 06/27/2020, 5:50 PM

## 2020-06-27 NOTE — Progress Notes (Signed)
PROGRESS NOTE    Kathleen Cameron  WIO:[id number] DOB: 1966-01-11 DOA: 06/03/2020 PCP: Kathleen Pollen, MD     Brief Narrative:  HPI On 06/03/2020 by Ms. Kathleen Laughter, NP (PCCM) Kathleen Cameron a54 y.o.with a past medical history significant for seizure disorder, hypertension, severe COPD, acute on chronic hypoxic and hypercapnic respiratory failure, and anxiety who presented to the ED via EMS with acute on chronic hypoxic and hypercapnic respiratory failure. Of note patient was recently discharged from [facility] 2 days prior to admission.  Primary history obtained from chart review as patient is currently on ventilator through chronic trach. It appears patient was treated at this facility November 2021 for acute on chronic hypoxic respiratory failure; she required endotracheal intubation and eventual tracheostomy due to difficulty weaning 03/02/2020. On 03/08/20 [facility] She was discharged home on 06/01/20.  Upon discharge home, per chart review it appears patient's home ventilator malfunction much with resulted in respiratory failure and need for bag mask ventilation. On arrival to ED, patient was placed on ventilator with home vent settings with no further acute respiratory compromise. PCCM was called for further management and admission.  Interim history Patient was admitted with acute respiratory distress after home ventilator failed.  She is admitted with acute on chronic hypoxic and hypercapnic respiratory failure, COPD.  Patient was also found to have a left subclavian DVT, and is since placed on Eliquis.  She continues to need ventilator assistance.  Patient with further anemia and requiring blood transfusion. GI consulted as FOBT +.  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  New events last 24 hours /  Subjective: Patient seen in the ICU.  She has no physical complaints, tolerated breakfast without any issues this morning.  Assessment & Plan:   Principal Problem:   Acute on chronic respiratory failure with hypoxia and hypercapnia (HCC) Active Problems:   Hypothyroidism (acquired)   Acute respiratory failure (HCC)   COPD, severe (HCC)   Anxiety disorder   Sepsis (Kathleen Cameron)   Healthcare-associated pneumonia   Pressure injury of skin   Tracheostomy status (HCC)   Acute on chronic hypoxic and hypercapnic respiratory failure secondary to COPD exacerbation -Currently requiring home mechanical ventilation.  Patient was initially admitted in November 2021 for respiratory failure and required intubation at that time was unable to wean and had tracheostomy placed.  [facility] then was [facility] home in February 2022. -Currently on full ventilator support -Patient was placed on prednisone for 5 days -Continue Brovana, Pulmicort, Yupelri nebs  Severe sepsis -Present on admission -Suspect secondary to pneumonia- VAP vs HAP -Patient was placed on antibiotics and completed 7 days -Blood cultures showed staph hominis bacteremia which was presumed to be a contamination  Provoked left subclavian DVT/edema -Patient was placed on Eliquis, will require this for 3 months through 09/09/2020 -Doppler was done on 06/12/20 showing left subclavian DVT -Currently on IV heparin, Eliquis on hold  Normocytic anemia/GI bleed versus anemia of acute illness -Hemoglobin baseline approximately 7-8 -Hemoglobin had dropped to 6.6, patient has received 2 units PRBC during this hospitalization -Given drop in hemoglobin FOBT and anemia panel were obtained.  FOBT was positive.  Anemia panel does show iron 17, ferritin 116, saturation ratio 5 -Status post colonoscopy and EGD 3/8 which found to bleeding angiodysplastic lesions which were treated -Continue IV heparin  Essential  hypertension -  Continue amlodipine  Hypothyroidism -Continue Synthroid  Acute metabolic encephalopathy -Resolved, likely in setting of respiratory failure  Anxiety -Continue clonazepam, temazepam, venlafaxine  Right lower extremity pain -X-rays unremarkable -No edema or erythema on examination -Continue pain control with gabapentin, oxycodone  Hypokalemia -Replace, trend   In agreement with assessment of the pressure ulcer as below:  Pressure Injury 06/03/20 Buttocks Right Stage 2 -  Partial thickness loss of dermis presenting as a shallow open injury with a red, pink wound bed without slough. (Active)  06/03/20 0500  Location: Buttocks  Location Orientation: Right  Staging: Stage 2 -  Partial thickness loss of dermis presenting as a shallow open injury with a red, pink wound bed without slough.  Wound Description (Comments):   Present on Admission: Yes     Pressure Injury 06/03/20 Ankle Anterior;Right Stage 2 -  Partial thickness loss of dermis presenting as a shallow open injury with a red, pink wound bed without slough. (Active)  06/03/20 0500  Location: Ankle  Location Orientation: Anterior;Right  Staging: Stage 2 -  Partial thickness loss of dermis presenting as a shallow open injury with a red, pink wound bed without slough.  Wound Description (Comments):   Present on Admission: Yes     Nutrition Problem: Moderate Malnutrition Etiology: chronic illness (COPD)   DVT prophylaxis: IV heparin    Code Status: Full Family Communication: None at bedside Disposition Plan:  Status is: Inpatient  Remains inpatient appropriate because:Inpatient level of care appropriate due to severity of illness   Dispo: The patient is from: Home              Anticipated d/c is to: Home              Patient currently is not medically stable to d/c.  Continue IV heparin and monitor hemoglobin.  Hopeful discharge home with home health next 24 hours.   Difficult to place patient  No     Antimicrobials:  Anti-infectives (From admission, onward)   Start     Dose/Rate Route Frequency Ordered Stop   06/19/20 1030  doxycycline (VIBRA-TABS) tablet 100 mg  Status:  Discontinued        100 mg Per Tube Every 12 hours 06/19/20 0942 06/21/20 0901   06/03/20 2000  vancomycin (VANCOREADY) IVPB 500 mg/100 mL  Status:  Discontinued        500 mg 100 mL/hr over 60 Minutes Intravenous Every 12 hours 06/03/20 0340 06/03/20 0827   06/03/20 0400  vancomycin (VANCOREADY) IVPB 1000 mg/200 mL        1,000 mg 200 mL/hr over 60 Minutes Intravenous  Once 06/03/20 0340 06/03/20 0528   06/03/20 0400  ceFEPIme (MAXIPIME) 2 g in sodium chloride 0.9 % 100 mL IVPB        2 g 200 mL/hr over 30 Minutes Intravenous Every 8 hours 06/03/20 0340 06/09/20 0630        Objective: Vitals:   06/27/20 1042 06/27/20 1100 06/27/20 1122 06/27/20 1200  BP:  132/81  114/82  Pulse:  82  78  Resp: $Remo'16 16  16  'HjDxL$ Temp:   97.6 F (36.4 C)   TempSrc:   Oral   SpO2: 98% 99%  100%  Weight:      Height:        Intake/Output Summary (Last 24 hours) at 06/27/2020 1315 Last data filed at 06/27/2020 1200 Gross per 24 hour  Intake 1226.25 ml  Output 600 ml  Net 626.25 ml   Danley Danker  Weights   06/25/20 0900 06/26/20 0409 06/27/20 0500  Weight: 57.3 kg 57.7 kg 57.4 kg    Examination:  General exam: Appears calm and comfortable  Respiratory system: Clear to auscultation. Respiratory effort normal. No respiratory distress.  On trach vent Cardiovascular system: S1 & S2 heard, RRR. No murmurs. No pedal edema. Gastrointestinal system: Abdomen is nondistended, soft and nontender. Normal bowel sounds heard. Central nervous system: Alert and oriented. No focal neurological deficits.  Psychiatry: Judgement and insight appear normal. Mood & affect appropriate.   Data Reviewed: I have personally reviewed following labs and imaging studies  CBC: Recent Labs  Lab 06/21/20 0224 06/21/20 1328 06/22/20 1146  06/23/20 0233 06/24/20 0146 06/25/20 0115 06/25/20 1930 06/26/20 0356 06/27/20 0111  WBC 8.9  --  8.4  --   --   --   --  8.8 6.9  HGB 6.6*   < > 8.3*   < > 7.3* 6.6* 9.8* 8.7* 8.1*  HCT 22.3*   < > 26.0*   < > 24.3* 21.7* 31.1* 27.3* 25.3*  MCV 94.9  --  90.9  --   --   --   --  90.4 90.7  PLT 268  --  247  --   --   --   --  230 244   < > = values in this interval not displayed.   Basic Metabolic Panel: Recent Labs  Lab 06/21/20 0224 06/22/20 1146 06/23/20 0233 06/26/20 0356 06/27/20 0111  NA 141 142 140 142 140  K 4.3 3.9 3.7 4.0 3.4*  CL 98 97* 97* 98 101  CO2 33* 37* 32 36* 30  GLUCOSE 149* 101* 79 124* 125*  BUN 23* 26* 23* 19 17  CREATININE 0.39* 0.35* 0.32* 0.41* 0.39*  CALCIUM 9.0 9.2 9.1 9.4 9.2  MG 1.8  --  1.8  --   --   PHOS 3.4  --   --   --   --    GFR: Estimated Creatinine Clearance: 67.8 mL/min (A) (by C-G formula based on SCr of 0.39 mg/dL (L)). Liver Function Tests: No results for input(s): AST, ALT, ALKPHOS, BILITOT, PROT, ALBUMIN in the last 168 hours. No results for input(s): LIPASE, AMYLASE in the last 168 hours. No results for input(s): AMMONIA in the last 168 hours. Coagulation Profile: No results for input(s): INR, PROTIME in the last 168 hours. Cardiac Enzymes: No results for input(s): CKTOTAL, CKMB, CKMBINDEX, TROPONINI in the last 168 hours. BNP (last 3 results) No results for input(s): PROBNP in the last 8760 hours. HbA1C: No results for input(s): HGBA1C in the last 72 hours. CBG: Recent Labs  Lab 06/26/20 1925 06/26/20 2317 06/27/20 0328 06/27/20 0746 06/27/20 1119  GLUCAP 91 115* 106* 78 97   Lipid Profile: No results for input(s): CHOL, HDL, LDLCALC, TRIG, CHOLHDL, LDLDIRECT in the last 72 hours. Thyroid Function Tests: No results for input(s): TSH, T4TOTAL, FREET4, T3FREE, THYROIDAB in the last 72 hours. Anemia Panel: No results for input(s): VITAMINB12, FOLATE, FERRITIN, TIBC, IRON, RETICCTPCT in the last 72  hours. Sepsis Labs: No results for input(s): PROCALCITON, LATICACIDVEN in the last 168 hours.  No results found for this or any previous visit (from the past 240 hour(s)).    Radiology Studies: No results found.    Scheduled Meds: . sodium chloride   Intravenous Once  . sodium chloride   Intravenous Once  . amLODipine  5 mg Per Tube Daily  . arformoterol  15 mcg Nebulization BID  .  aspirin  81 mg Per Tube Daily  . budesonide  0.25 mg Nebulization BID  . chlorhexidine gluconate (MEDLINE KIT)  15 mL Mouth Rinse BID  . Chlorhexidine Gluconate Cloth  6 each Topical Q0600  . clonazePAM  1 mg Per Tube BID  . colchicine  0.6 mg Per Tube Daily  . feeding supplement  237 mL Oral TID BM  . feeding supplement (OSMOLITE 1.5 CAL)  237 mL Per Tube BID  . feeding supplement (PROSource TF)  45 mL Per Tube BID  . free water  160 mL Per Tube TID BM  . insulin aspart  0-9 Units Subcutaneous Q4H  . levothyroxine  50 mcg Per Tube Q0600  . mouth rinse  15 mL Mouth Rinse 10 times per day  . multivitamin with minerals  1 tablet Per Tube Daily  . pantoprazole (PROTONIX) IV  40 mg Intravenous Q12H  . revefenacin  175 mcg Nebulization Daily  . rosuvastatin  40 mg Per Tube Daily  . temazepam  15 mg Per Tube QHS  . venlafaxine  37.5 mg Per Tube BID WC   Continuous Infusions: . sodium chloride Stopped (06/11/20 1143)  . heparin 950 Units/hr (06/27/20 1200)     LOS: 24 days      Time spent: 35 minutes   Dessa Phi, DO [facility] 06/27/2020, 1:15 PM   Available via Epic secure chat 7am-7pm After these hours, please refer to coverage provider listed on [email or web address]

## 2020-06-27 NOTE — Progress Notes (Signed)
[facility] ADULT ICU REPLACEMENT PROTOCOL   The patient does apply for the [facility] Adult ICU Electrolyte Replacment Protocol based on the criteria listed below:   1. Is GFR >/= 30 ml/min? Yes.    Patient's GFR today is >60 2. Is SCr </= 2? Yes.   Patient's SCr is 0.39 ml/kg/hr 3. Did SCr increase >/= 0.5 in 24 hours? No. 4. Abnormal electrolyte(s): K 3.4 5. Ordered repletion with: protocol 6. If a panic level lab has been reported, has the CCM MD in charge been notified? Yes.  .   Physician:  S. Bobbye Morton R Ruhl 06/27/2020 5:19 AM

## 2020-06-27 NOTE — Progress Notes (Signed)
UNASSIGNED PATIENT Subjective: Since I last evaluated the patient, she seems to be doing well.  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  She denies having any problems today from a GI standpoint.  She is scheduled to go to rehab on Monday as per the social worker.  Objective: Vital signs in last 24 hours: Temp:  [97.7 F (36.5 C)-98.6 F (37 C)] 98.4 F (36.9 C) (03/09 0748) Pulse Rate:  [76-118] 85 (03/09 0800) Resp:  [15-21] 16 (03/09 0800) BP: (92-157)/(64-106) 101/67 (03/09 0800) SpO2:  [96 %-100 %] 99 % (03/09 0800) FiO2 (%):  [40 %] 40 % (03/09 0800) Weight:  [57.4 kg] 57.4 kg (03/09 0500) Last BM Date: 06/26/20  Intake/Output from previous day: 03/08 0701 - 03/09 0700 In: 1076.7 [I.V.:409.7; NG/GT:397] Out: 850 [Urine:850] Intake/Output this shift: Total I/O In: 9.5 [I.V.:9.5] Out: -   General appearance: cooperative, appears stated age, mild distress and pale; has a tracheostomy Resp: clear to auscultation bilaterally Cardio: regular rate and rhythm, S1, S2 normal, no murmur, click, rub or gallop GI: soft, non-tender; bowel sounds normal; no masses,  no organomegaly; PEG in place Extremities: extremities normal, atraumatic, no cyanosis or edema  Lab Results: Recent Labs    06/25/20 1930 06/26/20 0356 06/27/20 0111  WBC  --  8.8 6.9  HGB 9.8* 8.7* 8.1*  HCT 31.1* 27.3* 25.3*  PLT  --  230 244   BMET Recent Labs    06/26/20 0356 06/27/20 0111  NA 142 140  K 4.0 3.4*  CL 98 101  CO2 36* 30  GLUCOSE 124* 125*  BUN 19 17  CREATININE 0.41* 0.39*  CALCIUM 9.4 9.2   Medications: I have reviewed the patient's current medications.  Assessment/Plan: 1) Iron deficiency anemia with guaiac positive stools-probably from bleeding AVMs of ablated the duodenum; monitor serial CBCs. 2) Chronic constipation on  MiraLAX and Colace. 3) Colonic polyps removed during colonoscopy. 4) Feeding difficulties-currently patient is being fed via PEG 5) Acute on chronic hypoxic and hypercapnic respiratory failure secondary to COPD exacerbation. 6) Provoked left subclavian DVT on heparin. 7) Hypothyroidism. 8) Acute metabolic encephalopathy. 9) Anxiety disorder. 10) HTN.     LOS: 24 days   Charna Elizabeth 06/27/2020, 8:42 AM

## 2020-06-28 DIAGNOSIS — J449 Chronic obstructive pulmonary disease, unspecified: Secondary | ICD-10-CM | POA: Diagnosis not present

## 2020-06-28 DIAGNOSIS — J9621 Acute and chronic respiratory failure with hypoxia: Secondary | ICD-10-CM | POA: Diagnosis not present

## 2020-06-28 DIAGNOSIS — J9622 Acute and chronic respiratory failure with hypercapnia: Secondary | ICD-10-CM | POA: Diagnosis not present

## 2020-06-28 LAB — BASIC METABOLIC PANEL
Anion gap: 7 (ref 5–15)
BUN: 17 mg/dL (ref 6–20)
CO2: 34 mmol/L — ABNORMAL HIGH (ref 22–32)
Calcium: 9.1 mg/dL (ref 8.9–10.3)
Chloride: 101 mmol/L (ref 98–111)
Creatinine, Ser: 0.37 mg/dL — ABNORMAL LOW (ref 0.44–1.00)
GFR, Estimated: >60 mL/min (ref >60)
Glucose, Bld: 133 mg/dL — ABNORMAL HIGH (ref 70–99)
Potassium: 4.1 mmol/L (ref 3.5–5.1)
Sodium: 142 mmol/L (ref 135–145)

## 2020-06-28 LAB — CBC
HCT: 25.6 % — ABNORMAL LOW (ref 36.0–46.0)
Hemoglobin: 8 g/dL — ABNORMAL LOW (ref 12.0–15.0)
MCH: 28.9 pg (ref 26.0–34.0)
MCHC: 31.3 g/dL (ref 30.0–36.0)
MCV: 92.4 fL (ref 80.0–100.0)
Platelets: 239 10*3/uL (ref 150–400)
RBC: 2.77 MIL/uL — ABNORMAL LOW (ref 3.87–5.11)
RDW: 16.9 % — ABNORMAL HIGH (ref 11.5–15.5)
WBC: 6.2 10*3/uL (ref 4.0–10.5)
nRBC: 0 % (ref 0.0–0.2)

## 2020-06-28 LAB — GLUCOSE, CAPILLARY
Glucose-Capillary: 111 mg/dL — ABNORMAL HIGH (ref 70–99)
Glucose-Capillary: 75 mg/dL (ref 70–99)
Glucose-Capillary: 114 mg/dL — ABNORMAL HIGH (ref 70–99)
Glucose-Capillary: 110 mg/dL — ABNORMAL HIGH (ref 70–99)
Glucose-Capillary: 67 mg/dL — ABNORMAL LOW (ref 70–99)
Glucose-Capillary: 81 mg/dL (ref 70–99)
Glucose-Capillary: 109 mg/dL — ABNORMAL HIGH (ref 70–99)

## 2020-06-28 LAB — MAGNESIUM: Magnesium: 1.9 mg/dL (ref 1.7–2.4)

## 2020-06-28 LAB — SURGICAL PATHOLOGY

## 2020-06-28 LAB — HEPARIN LEVEL (UNFRACTIONATED): Heparin Unfractionated: 0.28 IU/mL — ABNORMAL LOW (ref 0.30–0.70)

## 2020-06-28 MED ORDER — CHLORHEXIDINE GLUCONATE 0.12 % MT SOLN
OROMUCOSAL | Status: AC
  Filled 2020-06-28: qty 15

## 2020-06-28 MED ORDER — HYDROCERIN EX CREA
TOPICAL_CREAM | Freq: Every day | CUTANEOUS | Status: DC
  Administered 2020-06-30: 1 via TOPICAL
  Filled 2020-06-28: qty 113

## 2020-06-28 MED ORDER — FENTANYL CITRATE (PF) 100 MCG/2ML IJ SOLN: 25.0000 ug | INTRAMUSCULAR | Status: DC | PRN

## 2020-06-28 MED ORDER — APIXABAN 5 MG PO TABS: 5.0000 mg | ORAL_TABLET | Freq: Two times a day (BID) | ORAL | Status: DC

## 2020-06-28 MED ORDER — APIXABAN 5 MG PO TABS: 10.0000 mg | ORAL_TABLET | Freq: Two times a day (BID) | ORAL | Status: DC

## 2020-06-28 MED ORDER — CHLORHEXIDINE GLUCONATE CLOTH 2 % EX PADS
6.0000 | MEDICATED_PAD | Freq: Every day | CUTANEOUS | Status: DC
  Administered 2020-06-28 – 2020-07-02 (×5): 6 via TOPICAL

## 2020-06-28 MED ORDER — APIXABAN 5 MG PO TABS
5.0000 mg | ORAL_TABLET | Freq: Two times a day (BID) | ORAL | Status: DC
  Filled 2020-06-28: qty 1

## 2020-06-28 MED ORDER — APIXABAN 5 MG PO TABS
5.0000 mg | ORAL_TABLET | Freq: Two times a day (BID) | ORAL | Status: DC
  Administered 2020-06-28: 5 mg

## 2020-06-28 MED ORDER — APIXABAN 5 MG PO TABS
5.0000 mg | ORAL_TABLET | Freq: Two times a day (BID) | ORAL | Status: DC
  Administered 2020-06-28 – 2020-07-02 (×8): 5 mg
  Filled 2020-06-28 (×8): qty 1

## 2020-06-28 MED ORDER — SODIUM CHLORIDE 0.9 % IV SOLN
125.0000 mg | Freq: Once | INTRAVENOUS | Status: AC
  Administered 2020-06-28: 125 mg via INTRAVENOUS
  Filled 2020-06-28: qty 10

## 2020-06-28 NOTE — Progress Notes (Signed)
eLink Physician-Brief Progress Note Patient Name: Kathleen Cameron DOB: 31-Mar-1966 MRN: [id number]   Date of Service  06/28/2020  HPI/Events of Note  ABG on 40%/AC 18/TV 480/P 5 = 7.379/58.4/104. Review of CXR reveals slight improvement in focal airspace opacity in the left mid lung. Patient is now resting comfortably.   eICU Interventions  Continue present management.       Intervention Category Major Interventions: Other:  Tremond Shimabukuro Dennard Nip 06/28/2020, 12:08 AM

## 2020-06-28 NOTE — Progress Notes (Signed)
ANTICOAGULATION CONSULT NOTE  Pharmacy Consult for Heparin Indication: DVT  Allergies  Allergen Reactions  . Stiolto Respimat [Tiotropium Bromide-Olodaterol] Other (See Comments)    Severe headaches and rapid heartrate    Patient Measurements: Height: 5\' 4"  (162.6 cm) Weight: 57 kg (125 lb 10.6 oz) IBW/kg (Calculated) : 54.7 Heparin Dosing Weight: 57.3 kg   Vital Signs: Temp: 98 F (36.7 C) (03/10 0339) Temp Source: Axillary (03/10 0339) BP: 109/76 (03/10 0600) Pulse Rate: 88 (03/10 0600)  Labs: Recent Labs    06/26/20 0356 06/26/20 1134 06/27/20 0111 06/27/20 0915 06/27/20 1611 06/27/20 1757 06/28/20 0134  HGB 8.7*  --  8.1*  --   --   --  8.0*  HCT 27.3*  --  25.3*  --   --   --  25.6*  PLT 230  --  244  --   --   --  239  APTT 81* 86* 84* 90*  --   --   --   HEPARINUNFRC 0.77*  --  0.35 0.30  --   --  0.28*  CREATININE 0.41*  --  0.39*  --  0.38*  --  0.37*  TROPONINIHS  --   --   --   --  6 7  --     Estimated Creatinine Clearance: 67.8 mL/min (A) (by C-G formula based on SCr of 0.37 mg/dL (L)).   Medical History: Past Medical History:  Diagnosis Date  . Acute on chronic respiratory failure with hypoxia (HCC)   . Anxiety disorder   . Asthma   . COPD (chronic obstructive pulmonary disease) (HCC)   . COPD, severe (HCC)   . Hypertension   . Seizure disorder [facility])     Assessment: 55 yo female admitted on 06/03/2020 with acute respiratory distress after home ventilator failed. 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 Today, hgb 6.6. Pharmacy consulted to start heparin due to concern of drop in hemoglobin. FOBT positive 3/6. Last dose of apixaban 3/6 at 2100. Patient underwent EGD/colonoscopy on 3/8 with 2 bleeding lesions in duodenum clipped and 2 polyps removed. Heparin resumed after procedure.   Heparin level slightly sub-therapeutic this AM at 0.28 on heparin 950 units/hr. Hgb 8. Platelets are within normal limits.  No noted bleeding   Goal of Therapy:  Heparin level 0.3 - 0.5 units/ml; No bolus Monitor platelets by anticoagulation protocol: Yes   Plan:  Increase heparin to 1000 units/hr  Heparin level in 6 hours  Monitor heparin level, CBC and s/s of bleeding daily  Follow up GI and TRH recommendations for anticoagulation  5/8, PharmD, BCPS, BCCCP Clinical Pharmacist Please refer to [facility] Pharmacy numbers 06/28/2020 7:12 AM

## 2020-06-28 NOTE — Progress Notes (Signed)
PROGRESS NOTE   Kathleen Cameron  SLH:[id number] DOB: May 01, 1965 DOA: 06/03/2020 PCP: Horald Pollen, MD  Brief Narrative:  55 year old black female Known severe COPD chronic hypoxic hypercapnic respiratory failure Seizure disorder HTN Rx 03/10/2020 chronic respiratory failure-eventually trach 03/02/2020 [facility] and then went home on 2/11 Apparently home ventilator malfunctioned = respiratory failure bag mask ventilation  She was admitted with type I-type II respiratory failure secondary to COPD She was found to have left subclavian DVT and placed on Eliquis Because of anemia and positive FOBT , She underwent EGD and colonoscopy = 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 Kary Kos on this note so that he is  aware of difficulties getting to the outpatient setting with  ventilator Continues on Osmolite 237 twice daily in addition to 3 times daily between meals Ensure-giving free water 160 twice daily Cut back fentanyl to 25/50 every 2 as needed to promote daytime wakefulness  Severe sepsis secondary to develop versus HAP on admission   staph hominis bacteremia?  Contaminant  Completed antibiotics 7 days total on 2/18 Periodic lab rechecks  Left subclavian DVT provoked  Will require 3 months of anticoagulation through  09/09/2020  Discussed and clarified with Dr. Benson Norway of GI that patient  can start Eliquis and monitor for further bleeding over  the next 24  hours  Normocytic anemia superimposed on duodenal bleed  Iron studies earlier in admission 3 6 show low sat ratios  Will give Nulecit infusion unless it has been given  recently  HTN On amlodipine 10, metoprolol as needed 5 mg every 5 minutes above 120 Pressures are relatively well controlled  Anxiety  Continue temazepam 15 at bedtime, Effexor 37.5 twice  daily   Right lower extremity pains  Desquamating skin over lower extremities-we will  request wound nurse to see and give opinion   pressure ulcer As above   DVT prophylaxis: Heparin drip transitioned to Eliquis 3/10 Code Status: Full CODE STATUS  family Communication:  [name]   [phone]  DIscussed and updated in detail 3/10  Disposition:  Status is: Inpatient  Remains inpatient appropriate because:Hemodynamically unstable, Ongoing active pain requiring inpatient pain management and Altered mental status   Dispo: The patient is from: Home              Anticipated d/c is to: Home              Patient currently is not medically stable to d/c.   Difficult to place patient No    Consultants:   Gi dr. hung  Procedures:   Antimicrobials: finshed    Subjective:  Seems to be in pain in legs no fever no chills Nursing ports passing smears of stool with no blood or bleeding   Objective: Vitals:   06/28/20 0504 06/28/20 0600 06/28/20 0700 06/28/20 0741  BP: 95/68 109/76 114/85   Pulse: 89 88 91   Resp: _0 Temp:   98.8 F (37.1 C)   TempSrc:  Oral   SpO2: 98% 100% 99% 99%  Weight:      Height:        Intake/Output Summary (Last 24 hours) at 06/28/2020 1224 Last data filed at 06/28/2020 1200 Gross per 24 hour  Intake 1526.04 ml  Output 800 ml  Net 726.04 ml   Filed Weights   06/26/20 0409 06/27/20 0500 06/28/20 0348  Weight: 57.7 kg 57.4 kg 57 kg    Examination:  Cachectic appearing black female no distress size 6 trach in place she has a PEG tube in place CTA B but poor  exam posterolaterally Abdomen soft nontender Desquamating lower extremities skin peeling off Moving all 4 limbs  Data Reviewed: personally reviewed   CBC    Component Value Date/Time   WBC 6.2 06/28/2020 0134   RBC 2.77 (L) 06/28/2020 0134   HGB 8.0 (L) 06/28/2020 0134   HGB 13.4 09/14/2018 1220   HCT 25.6 (L) 06/28/2020 0134   HCT 39.3 09/14/2018 1220   PLT 239 06/28/2020 0134   PLT 303 09/14/2018 1220   MCV 92.4 06/28/2020 0134   MCV 84 09/14/2018 1220   MCH 28.9 06/28/2020 0134   MCHC 31.3 06/28/2020 0134   RDW 16.9 (H) 06/28/2020 0134   RDW 12.9 09/14/2018 1220   LYMPHSABS 1.2 06/11/2020 0728   LYMPHSABS 1.7 09/14/2018 1220   MONOABS 0.5 06/11/2020 0728   EOSABS 0.1 06/11/2020 0728   EOSABS 0.3 09/14/2018 1220   BASOSABS 0.0 06/11/2020 0728   BASOSABS 0.1 09/14/2018 1220   CMP Latest Ref Rng & Units 06/28/2020 06/27/2020 06/27/2020  Glucose 70 - 99 mg/dL 133(H) 100(H) 125(H)  BUN 6 - 20 mg/dL 17 17 17  Creatinine 0.44 - 1.00 mg/dL 0.37(L) 0.38(L) 0.39(L)  Sodium 135 - 145 mmol/L 142 142 140  Potassium 3.5 - 5.1 mmol/L 4.1 3.7 3.4(L)  Chloride 98 - 111 mmol/L 101 101 101  CO2 22 - 32 mmol/L 34(H) 31 30  Calcium 8.9 - 10.3 mg/dL 9.1 9.4 9.2  Total Protein 6.5 - 8.1 g/dL - - -  Total Bilirubin 0.3 - 1.2 mg/dL - - -  Alkaline Phos 38 - 126 U/L - - -  AST 15 - 41 U/L - - -  ALT 0 - 44 U/L - - -     Radiology Studies: DG CHEST PORT 1 VIEW  Result Date: 06/27/2020 CLINICAL DATA:  Shortness of breath EXAM: PORTABLE CHEST 1 VIEW COMPARISON:  06/23/20 FINDINGS: Cardiac shadow is stable. The lungs are hyperinflated stable from the prior exam. Patchy airspace opacity is noted in the left mid lung slightly improved from the prior study. Mild interstitial changes are again identified and stable. No new focal abnormality is seen. IMPRESSION: Slight improvement in focal airspace opacity in the left mid lung. Electronically Signed   By: Mark  Lukens M.D.   On: 06/27/2020 22:42      Scheduled Meds: . sodium chloride   Intravenous Once  . sodium chloride   Intravenous Once  . amLODipine  5 mg Per Tube Daily  . apixaban  10 mg Oral BID   Followed by  . [START ON 07/05/2020] apixaban  5 mg Oral BID  . arformoterol  15 mcg Nebulization BID  . aspirin  81 mg Per Tube Daily  . budesonide  0.25 mg Nebulization BID  . chlorhexidine gluconate (MEDLINE KIT)  15 mL Mouth Rinse BID  . Chlorhexidine Gluconate Cloth  6 each Topical Q0600  . clonazePAM  1 mg Per Tube BID  .   colchicine  0.6 mg Per Tube Daily  . feeding supplement  237 mL Oral TID BM  . feeding supplement (OSMOLITE 1.5 CAL)  237 mL Per Tube BID  . feeding supplement (PROSource TF)  45 mL Per Tube BID  . free water  160 mL Per Tube BID  . insulin aspart  0-9 Units Subcutaneous Q4H  . levothyroxine  50 mcg Per Tube Q0600  . mouth rinse  15 mL Mouth Rinse 10 times per day  . multivitamin with minerals  1 tablet Per Tube Daily  . pantoprazole (PROTONIX) IV  40 mg Intravenous Q12H  . revefenacin  175 mcg Nebulization Daily  . rosuvastatin  40 mg Per Tube Daily  . temazepam  15 mg Per Tube QHS  . venlafaxine  37.5 mg Per Tube BID WC   Continuous Infusions: . sodium chloride Stopped (06/11/20 1143)  . heparin 1,000 Units/hr (06/28/20 0800)     LOS: 25 days   Time spent: [place], MD [facility] To contact the attending provider between 7A-7P or the covering provider during after hours 7P-7A, please log into the web site [email or web address] and access using universal [place] password for that web site. If you do not have the password, please call the hospital operator.  06/28/2020, 12:24 PM

## 2020-06-28 NOTE — Consult Note (Addendum)
WOC Nurse Consult Note: Reason for Consult: Consult requested for dry skin to feet and legs. Pt has been critically ill for a prolonged period of time and this phenomenon frequently occurs. Wound type: Legs with generalized dry peeling skin.  Bilat feet with skin peeling off in loose sheets, revealing pink dry intact skin beneath. Dressing procedure/placement/frequency: Topical treatment provided for bedside nurses to perform as follows to promote moist healing: apply Eucerin cream to bilat feet and legs Q day. Please re-consult if further assistance is needed.  Thank-you,  Cammie Mcgee MSN, RN, CWOCN, [place], CNS [phone]

## 2020-06-28 NOTE — Progress Notes (Signed)
NAME:  Kathleen Cameron, MRN:  [id number], DOB:  1965-10-27, LOS: 25 ADMISSION DATE:  06/03/2020, CONSULTATION DATE:  2/12 REFERRING MD:  Pilar Plate, CHIEF COMPLAINT:  Dyspnea   Brief History:  55 year old female presented from home via EMS with complaints of acute respiratory distress after home ventilator failed.  [facility]  PCCM consulted for further management and admission. Diagnosed with HCAP and COPD exacerbation on admission.   Past Medical History:  Seizure disorder Hypertension Severe COPD Acute on chronic hypoxic and hypercapnic respiratory failure Anxiety  Significant Hospital Events:  Admitted 2/13, treated for hcap/copd exac 2/16 home vent 3/1 foot pain/elbow pain 3/3 podagra  Consults:  FEES 2/25 > without PMSV no aspiration, rec regular texture diet with thin liquids  Procedures:  Tracheostomy present prior to admission  Significant Diagnostic Tests:  2/23 Vas ultrasound upper extre> left subclavian DVT and superficial vein thrombosis 2/25 SLP  Micro Data:  2/13 sars cov 2/flu > negative 2/13 blood > staph hominis 1/4 2/13 resp > OPF 2/16 blood > No growth  Antimicrobials:  Cefepime 2/13 > 2/19 Vanc 2/13 x1  3/1 doxycycline  Interim History / Subjective:   Tolerating MV via tracheostomy, intermittently using inline speaking valve.  Taking p.o. Had some dyspnea 3/9 prompted chest x-ray that was stable, showed some slight improvement in left midlung opacity Most recent ABG 3/9 compensated with mild baseline hypercapnia, no hypoxia  Underwent EGD 3/8 that showed 2 angiodysplastic lesions in the duodenum that were treated with monopolar probe and clipped.  Anticoagulation has been restarted, tolerating   Objective   Blood pressure 114/85, pulse 91, temperature 98.8 F (37.1 C), temperature source Oral, resp. rate 16, height 5\' 4"  (1.626 m), weight 57 kg, SpO2 99 %.    Vent Mode: AC FiO2 (%):  [36 %-40 %] 40 % Set Rate:   [16 bmp] 16 bmp Vt Set:  [480 mL] 480 mL PEEP:  [5 cmH20] 5 cmH20 Plateau Pressure:  [16 cmH20-19 cmH20] 19 cmH20   Intake/Output Summary (Last 24 hours) at 06/28/2020 08/28/2020 Last data filed at 06/28/2020 0800 Gross per 24 hour  Intake 1293.81 ml  Output 800 ml  Net 493.81 ml   Filed Weights   06/26/20 0409 06/27/20 0500 06/28/20 0348  Weight: 57.7 kg 57.4 kg 57 kg    Examination: General: Thin ill-appearing woman, sitting up in bed on MV HENT: Trach site clean dry intact PULM: Very distant but clear CV: Regular, distant, no murmur GI: Soft, nondistended, positive bowel sounds MSK: No cyanosis or clubbing, no edema Neuro: Awake and interacting, follows commands, good strength Psych: pleasant mood and affect   Resolved Hospital Problem list     Assessment & Plan:   Acute on chronic hypoxemic and hypercarbic respiratory failure in setting of COPD S/p tracheostomy, chronic vent use  P -Continue routine trach and vent care -Speaking valve as tolerated (inline with MV) -We will work on arranging for trach clinic follow-up with 08/28/20 -Current plan is home with home ventilator and home health assistance including RT, nursing  Provoked L Subclav DVT P -Tolerating restart heparin infusion, plan to transition back to Eliquis today 3/10.  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  Home health assessment and assistance  Best practice (evaluated daily)  Goals of Care:    Labs   CBC: Recent Labs  Lab 06/22/20 1146 06/23/20 0233 06/25/20 0115 06/25/20 1930 06/26/20 0356 06/27/20 0111 06/28/20 0134  WBC 8.4  --   --   --  8.8 6.9 6.2  HGB 8.3*   < > 6.6* 9.8* 8.7* 8.1* 8.0*  HCT 26.0*   < > 21.7* 31.1* 27.3* 25.3* 25.6*  MCV 90.9  --   --   --   90.4 90.7 92.4  PLT 247  --   --   --  230 244 239   < > = values in this interval not displayed.    Basic Metabolic Panel: Recent Labs  Lab 06/23/20 0233 06/26/20 0356 06/27/20 0111 06/27/20 1611 06/28/20 0134  NA 140 142 140 142 142  K 3.7 4.0 3.4* 3.7 4.1  CL 97* 98 101 101 101  CO2 32 36* 30 31 34*  GLUCOSE 79 124* 125* 100* 133*  BUN 23* 19 17 17 17   CREATININE 0.32* 0.41* 0.39* 0.38* 0.37*  CALCIUM 9.1 9.4 9.2 9.4 9.1  MG 1.8  --   --  1.9 1.9   GFR: Estimated Creatinine Clearance: 67.8 mL/min (A) (by C-G formula based on SCr of 0.37 mg/dL (L)). Recent Labs  Lab 06/22/20 1146 06/26/20 0356 06/27/20 0111 06/28/20 0134  WBC 8.4 8.8 6.9 6.2    Liver Function Tests: No results for input(s): AST, ALT, ALKPHOS, BILITOT, PROT, ALBUMIN in the last 168 hours. No results for input(s): LIPASE, AMYLASE in the last 168 hours. No results for input(s): AMMONIA in the last 168 hours.  ABG    Component Value Date/Time   PHART 7.379 06/27/2020 2247   PCO2ART 58.4 (H) 06/27/2020 2247   PO2ART 104 06/27/2020 2247   HCO3 33.8 (H) 06/27/2020 2247   TCO2 35 (H) 06/03/2020 0052   O2SAT 98.1 06/27/2020 2247    Coagulation Profile: No results for input(s): INR, PROTIME in the last 168 hours.  Cardiac Enzymes: No results for input(s): CKTOTAL, CKMB, CKMBINDEX, TROPONINI in the last 168 hours.  HbA1C: Hgb A1c MFr Bld  Date/Time Value Ref Range Status  06/21/2020 02:24 AM 5.3 4.8 - 5.6 % Final    Comment:    (NOTE) Pre diabetes:          5.7%-6.4%  Diabetes:              >6.4%  Glycemic control for   <7.0% adults with diabetes   02/26/2020 05:18 PM 5.4 4.8 - 5.6 % Final    Comment:    (NOTE) Pre diabetes:          5.7%-6.4%  Diabetes:              >6.4%  Glycemic control for   <7.0% adults with diabetes     CBG: Recent Labs  Lab 06/27/20 1507 06/27/20 1924 06/27/20 2314 06/28/20 0316 06/28/20 0724  GLUCAP 109* 77 87 111* 75    08/28/20, MD,  PhD 06/28/2020, 9:44 AM [facility] [phone] or if no answer before 7:00PM call [phone] For any issues after 7:00PM please call eLink [phone]

## 2020-06-29 DIAGNOSIS — J9621 Acute and chronic respiratory failure with hypoxia: Secondary | ICD-10-CM | POA: Diagnosis not present

## 2020-06-29 DIAGNOSIS — J9622 Acute and chronic respiratory failure with hypercapnia: Secondary | ICD-10-CM | POA: Diagnosis not present

## 2020-06-29 LAB — CBC WITH DIFFERENTIAL/PLATELET
Abs Immature Granulocytes: 0.07 10*3/uL (ref 0.00–0.07)
Basophils Absolute: 0 10*3/uL (ref 0.0–0.1)
Basophils Relative: 0 %
Eosinophils Absolute: 0.2 10*3/uL (ref 0.0–0.5)
Eosinophils Relative: 3 %
HCT: 24 % — ABNORMAL LOW (ref 36.0–46.0)
Hemoglobin: 7.3 g/dL — ABNORMAL LOW (ref 12.0–15.0)
Immature Granulocytes: 1 %
Lymphocytes Relative: 24 %
Lymphs Abs: 1.7 10*3/uL (ref 0.7–4.0)
MCH: 28 pg (ref 26.0–34.0)
MCHC: 30.4 g/dL (ref 30.0–36.0)
MCV: 92 fL (ref 80.0–100.0)
Monocytes Absolute: 0.6 10*3/uL (ref 0.1–1.0)
Monocytes Relative: 8 %
Neutro Abs: 4.5 10*3/uL (ref 1.7–7.7)
Neutrophils Relative %: 64 %
Platelets: 241 10*3/uL (ref 150–400)
RBC: 2.61 MIL/uL — ABNORMAL LOW (ref 3.87–5.11)
RDW: 16.9 % — ABNORMAL HIGH (ref 11.5–15.5)
WBC: 7.2 10*3/uL (ref 4.0–10.5)
nRBC: 0 % (ref 0.0–0.2)

## 2020-06-29 LAB — COMPREHENSIVE METABOLIC PANEL
ALT: 35 U/L (ref 0–44)
AST: 33 U/L (ref 15–41)
Albumin: 2 g/dL — ABNORMAL LOW (ref 3.5–5.0)
Alkaline Phosphatase: 70 U/L (ref 38–126)
Anion gap: 4 — ABNORMAL LOW (ref 5–15)
BUN: 17 mg/dL (ref 6–20)
CO2: 35 mmol/L — ABNORMAL HIGH (ref 22–32)
Calcium: 9.1 mg/dL (ref 8.9–10.3)
Chloride: 99 mmol/L (ref 98–111)
Creatinine, Ser: 0.42 mg/dL — ABNORMAL LOW (ref 0.44–1.00)
GFR, Estimated: >60 mL/min (ref >60)
Glucose, Bld: 124 mg/dL — ABNORMAL HIGH (ref 70–99)
Potassium: 3.9 mmol/L (ref 3.5–5.1)
Sodium: 138 mmol/L (ref 135–145)
Total Bilirubin: 0.3 mg/dL (ref 0.3–1.2)
Total Protein: 5.7 g/dL — ABNORMAL LOW (ref 6.5–8.1)

## 2020-06-29 LAB — GLUCOSE, CAPILLARY
Glucose-Capillary: 87 mg/dL (ref 70–99)
Glucose-Capillary: 73 mg/dL (ref 70–99)
Glucose-Capillary: 112 mg/dL — ABNORMAL HIGH (ref 70–99)
Glucose-Capillary: 81 mg/dL (ref 70–99)
Glucose-Capillary: 98 mg/dL (ref 70–99)
Glucose-Capillary: 104 mg/dL — ABNORMAL HIGH (ref 70–99)

## 2020-06-29 MED ORDER — ONDANSETRON HCL 4 MG/2ML IJ SOLN
4.0000 mg | Freq: Four times a day (QID) | INTRAMUSCULAR | Status: DC | PRN
  Administered 2020-06-29: 4 mg via INTRAVENOUS
  Filled 2020-06-29: qty 2

## 2020-06-29 NOTE — Progress Notes (Addendum)
Physical Therapy Treatment Patient Details Name: Kathleen Cameron MRN: [id number] DOB: Aug 22, 1965 Today's Date: 06/29/2020    History of Present Illness 55 y.o. female admitted 2/13 from home with respiratory distress due to home vent malfunction after same day D/C from [facility] with home vent. PMHx pt with [facility] admission 11/6 with trach 03/02/20 ant transition to [facility] 11/18-2/12, 04/25/19 Gtube placed, COPD, hypertension, depression/anxiety, HLD, shingles infection,    PT Comments    Pt admitted with above diagnosis. Pt was co treated with OT due to complexity.  Pt was able to sit EOB x 10 minutes with min guard assist with dynamic reaching activities and LE exercises.  Pt fatigued after 10 min and asking to lie down.  Plan is to go home on Monday.  Will continue PT. Pt currently with functional limitations due to balance and endurance deficits. Pt will benefit from skilled PT to increase their independence and safety with mobility to allow discharge to the venue listed below.     Follow Up Recommendations  Supervision/Assistance - 24 hour;Home health PT (HHOT, HHAide, HHRN)     Equipment Recommendations  None recommended by PT    Recommendations for Other Services       Precautions / Restrictions Precautions Precautions: Fall;Other (comment) Precaution Comments: trach, on home vent Restrictions Weight Bearing Restrictions: No    Mobility  Bed Mobility Overal bed mobility: Needs Assistance Bed Mobility: Supine to Sit;Sit to Supine Rolling: Min assist Sidelying to sit: Min assist;HOB elevated Supine to sit: Min assist;HOB elevated Sit to supine: Min assist Sit to sidelying: Mod assist General bed mobility comments: sitting EOB x10 mins for dynamic sitting balance tasks    Transfers                 General transfer comment: NT  Ambulation/Gait             General Gait Details: unable   Stairs             Wheelchair Mobility    Modified  Rankin (Stroke Patients Only)       Balance Overall balance assessment: Needs assistance Sitting-balance support: Feet unsupported;No upper extremity supported Sitting balance-Leahy Scale: Fair Sitting balance - Comments: 10 min sitting EOB - min guard for safety                                    Cognition Arousal/Alertness: Awake/alert Behavior During Therapy: WFL for tasks assessed/performed Overall Cognitive Status: Within Functional Limits for tasks assessed                                 General Comments: pt mouthing words, nodding and following commands. Trach and difficult to read lips at times      Exercises General Exercises - Upper Extremity Shoulder Flexion: Both;AROM;10 reps;Seated General Exercises - Lower Extremity Ankle Circles/Pumps: AROM;Both;5 reps Long Arc Quad: Both;Seated;15 reps;AROM    General Comments General comments (skin integrity, edema, etc.): VSS with pt on home vent with PEEP 5      Pertinent Vitals/Pain Pain Assessment: Faces Faces Pain Scale: Hurts a little bit Pain Location: Bil feet Pain Descriptors / Indicators: Discomfort Pain Intervention(s): Limited activity within patient's tolerance;Monitored during session;Repositioned    Home Living  Prior Function            PT Goals (current goals can now be found in the care plan section) Acute Rehab PT Goals Patient Stated Goal: strengthening to return home Progress towards PT goals: Progressing toward goals    Frequency    Min 2X/week      PT Plan Discharge plan needs to be updated    Co-evaluation              AM-PAC PT "6 Clicks" Mobility   Outcome Measure  Help needed turning from your back to your side while in a flat bed without using bedrails?: A Lot Help needed moving from lying on your back to sitting on the side of a flat bed without using bedrails?: A Lot Help needed moving to and from a bed to a  chair (including a wheelchair)?: Total Help needed standing up from a chair using your arms (e.g., wheelchair or bedside chair)?: Total Help needed to walk in hospital room?: Total Help needed climbing 3-5 steps with a railing? : Total 6 Click Score: 8    End of Session Equipment Utilized During Treatment: Other (comment) (vent) Activity Tolerance: Patient limited by fatigue Patient left: in bed;with call bell/phone within reach;with bed alarm set Nurse Communication: Other (comment) (pt's bed wet and needs assist) PT Visit Diagnosis: Other abnormalities of gait and mobility (R26.89);Difficulty in walking, not elsewhere classified (R26.2);Muscle weakness (generalized) (M62.81)     Time: 2979-8921 PT Time Calculation (min) (ACUTE ONLY): 23 min  Charges:  $Therapeutic Activity: 8-22 mins                     Cheyeanne Roadcap M,PT Acute Rehab Services [phone] (pager)   Bevelyn Buckles 06/29/2020, 11:14 AM

## 2020-06-29 NOTE — Progress Notes (Signed)
PROGRESS NOTE  Kathleen Cameron PHX:[id number] DOB: 30-May-1965 DOA: 06/03/2020 PCP: Horald Pollen, MD  HPI/Recap of past 3 hours: 55 year old black female Known severe COPD chronic hypoxic hypercapnic respiratory failure Seizure disorder HTN Rx 03/10/2020 chronic respiratory failure-eventually trach 03/02/2020 [facility] and then went home on 2/11 Apparently home ventilator malfunctioned = respiratory failure bag mask ventilation  She was admitted with type I-type II respiratory failure secondary to COPD She was found to have left subclavian DVT and placed on Eliquis Because of anemia and positive FOBT , She underwent EGD and colonoscopy = 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  06/29/20: Seen and examined at her bedside.  States her breathing is ok.  She does not have any complaints.  TOC assisting with home health services arrangement.  Assessment/Plan: Principal Problem:   Acute on chronic respiratory failure with hypoxia and hypercapnia (HCC) Active Problems:   Hypothyroidism (acquired)   Acute respiratory failure (HCC)   COPD, severe (HCC)   Anxiety disorder   Sepsis ([place])   Healthcare-associated pneumonia   Pressure injury of skin   Tracheostomy status ([place])  Chronic type I + 2 respiratory failure 2/2 COPD             Requires home mechanical ventilation---apparently this       cannot be set up until 3/14 because of lack of home           health nursing staff-I will asked TOC to clarify             Continue albuterol every 6 as needed Brovana 15 twice      daily Pulmicort 2 puffs daily and [place] pulmonology assistance-we will be going to        get trach clinic follow-up on Monday apparently             We will CC Mr. Kary Kos on this note so that he is    aware of difficulties getting to the outpatient setting with      ventilator Continues on Osmolite 237 twice daily in  addition to 3 times daily between meals Ensure-giving free water 160 twice daily Cut back fentanyl to 25/50 every 2 as needed to promote daytime wakefulness  Severe sepsis secondary to develop versus HAP on admission   staph hominis bacteremia?  Contaminant             Completed antibiotics 7 days total on 2/18 Periodic lab rechecks  Left subclavian DVT provoked             Will require 3 months of anticoagulation through       09/09/2020             Discussed and clarified with Dr. Benson Norway of GI that patient       can start Eliquis and monitor for further bleeding over       the next 24 hours  Normocytic anemia superimposed on duodenal bleed             Iron studies earlier in admission 3 6 show low sat ratios             Will give Nulecit infusion unless it has been given     recently Drop in Hg this AM Repeat H&H in the AM Low threshold to transfuse for symptomatic anemia  HTN On amlodipine 10, metoprolol as needed 5 mg  every 5 minutes above 120 Pressures are relatively well controlled  Anxiety             Continue temazepam 15 at bedtime, Effexor 37.5 twice       daily              Right lower extremity pains             Desquamating skin over lower extremities-we will     request wound nurse to see and give opinion   pressure ulcer As above           DVT prophylaxis: Eliquis Code Status: Full CODE STATUS  family Communication:  [name]   [phone]  DIscussed and updated in detail 3/10  Disposition:  Status is: Inpatient  Remains inpatient appropriate because:Hemodynamically unstable, Ongoing active pain requiring inpatient pain management and Altered mental status   Dispo: The patient is from: Home  Anticipated d/c is to: Home  Patient currently is not medically stable to d/c.              Difficult to place patient No    Consultants:   Gi dr. hung  Procedures:   Antimicrobials: finished      Objective: Vitals:   06/29/20 1000 06/29/20 1100 06/29/20 1118 06/29/20 1200  BP: (!) 134/93 (!) 134/94  (!) 149/88  Pulse: 97 (!) 108  89  Resp: 16 (!) 24  19  Temp:   98.4 F (36.9 C)   TempSrc:   Oral   SpO2: 100% 95%  99%  Weight:      Height:        Intake/Output Summary (Last 24 hours) at 06/29/2020 1327 Last data filed at 06/29/2020 1154 Gross per 24 hour  Intake 440 ml  Output 1575 ml  Net -1135 ml   Filed Weights   06/27/20 0500 06/28/20 0348 06/29/20 0500  Weight: 57.4 kg 57 kg 59.1 kg    Exam:  . General: 55 y.o. year-old female well developed well nourished in no acute distress.  Alert and oriented x3. . Cardiovascular: Regular rate and rhythm with no rubs or gallops.  [name] Respiratory: Mild rales at bases.  On vent.  Poor inspiratory effort. . Abdomen: Soft nontender nondistended with normal bowel sounds x4 quadrants. . Musculoskeletal: Trace lower and upper extremity edema. [name] Psychiatry: Mood is appropriate for condition and setting   Data Reviewed: CBC: Recent Labs  Lab 06/25/20 1930 06/26/20 0356 06/27/20 0111 06/28/20 0134 06/29/20 0155  WBC  --  8.8 6.9 6.2 7.2  NEUTROABS  --   --   --   --  4.5  HGB 9.8* 8.7* 8.1* 8.0* 7.3*  HCT 31.1* 27.3* 25.3* 25.6* 24.0*  MCV  --  90.4 90.7 92.4 92.0  PLT  --  230 244 239 573   Basic Metabolic Panel: Recent Labs  Lab 06/23/20 0233 06/26/20 0356 06/27/20 0111 06/27/20 1611 06/28/20 0134 06/29/20 0155  NA 140 142 140 142 142 138  K 3.7 4.0 3.4* 3.7 4.1 3.9  CL 97* 98 101 101 101 99  CO2 32 36* 30 31 34* 35*  GLUCOSE 79 124* 125* 100* 133* 124*  BUN 23* _0 CREATININE 0.32* 0.41* 0.39* 0.38* 0.37* 0.42*  CALCIUM 9.1 9.4 9.2 9.4 9.1 9.1  MG 1.8  --   --  1.9 1.9  --    GFR: Estimated Creatinine Clearance: 67.8 mL/min (A) (by C-G formula based on SCr of 0.42 mg/dL (  L)). Liver Function Tests: Recent Labs  Lab 06/29/20 0155  AST 33  ALT 35  ALKPHOS 70  BILITOT 0.3  PROT  5.7*  ALBUMIN 2.0*   No results for input(s): LIPASE, AMYLASE in the last 168 hours. No results for input(s): AMMONIA in the last 168 hours. Coagulation Profile: No results for input(s): INR, PROTIME in the last 168 hours. Cardiac Enzymes: No results for input(s): CKTOTAL, CKMB, CKMBINDEX, TROPONINI in the last 168 hours. BNP (last 3 results) No results for input(s): PROBNP in the last 8760 hours. HbA1C: No results for input(s): HGBA1C in the last 72 hours. CBG: Recent Labs  Lab 06/28/20 2011 06/28/20 2304 06/29/20 0315 06/29/20 0730 06/29/20 1117  GLUCAP 81 109* 87 73 112*   Lipid Profile: No results for input(s): CHOL, HDL, LDLCALC, TRIG, CHOLHDL, LDLDIRECT in the last 72 hours. Thyroid Function Tests: No results for input(s): TSH, T4TOTAL, FREET4, T3FREE, THYROIDAB in the last 72 hours. Anemia Panel: No results for input(s): VITAMINB12, FOLATE, FERRITIN, TIBC, IRON, RETICCTPCT in the last 72 hours. Urine analysis:    Component Value Date/Time   COLORURINE YELLOW 03/20/2020 0018   APPEARANCEUR HAZY (A) 03/20/2020 0018   LABSPEC 1.006 03/20/2020 0018   PHURINE 5.0 03/20/2020 0018   GLUCOSEU NEGATIVE 03/20/2020 0018   HGBUR NEGATIVE 03/20/2020 0018   BILIRUBINUR NEGATIVE 03/20/2020 0018   KETONESUR NEGATIVE 03/20/2020 0018   PROTEINUR NEGATIVE 03/20/2020 0018   UROBILINOGEN 1.0 11/12/2008 1440   NITRITE NEGATIVE 03/20/2020 0018   LEUKOCYTESUR NEGATIVE 03/20/2020 0018   Sepsis Labs: _0 (procalcitonin:4,lacticidven:4)  )No results found for this or any previous visit (from the past 240 hour(s)).    Studies: No results found.  Scheduled Meds: . sodium chloride   Intravenous Once  . sodium chloride   Intravenous Once  . amLODipine  5 mg Per Tube Daily  . apixaban  5 mg Per Tube BID  . arformoterol  15 mcg Nebulization BID  . aspirin  81 mg Per Tube Daily  . budesonide  0.25 mg Nebulization BID  . chlorhexidine gluconate (MEDLINE KIT)  15 mL Mouth Rinse  BID  . Chlorhexidine Gluconate Cloth  6 each Topical Q0600  . clonazePAM  1 mg Per Tube BID  . colchicine  0.6 mg Per Tube Daily  . feeding supplement  237 mL Oral TID BM  . feeding supplement (OSMOLITE 1.5 CAL)  237 mL Per Tube BID  . feeding supplement (PROSource TF)  45 mL Per Tube BID  . free water  160 mL Per Tube BID  . hydrocerin   Topical Daily  . insulin aspart  0-9 Units Subcutaneous Q4H  . levothyroxine  50 mcg Per Tube Q0600  . mouth rinse  15 mL Mouth Rinse 10 times per day  . multivitamin with minerals  1 tablet Per Tube Daily  . pantoprazole (PROTONIX) IV  40 mg Intravenous Q12H  . revefenacin  175 mcg Nebulization Daily  . rosuvastatin  40 mg Per Tube Daily  . temazepam  15 mg Per Tube QHS  . venlafaxine  37.5 mg Per Tube BID WC    Continuous Infusions: . sodium chloride 10 mL/hr at 06/28/20 1409     LOS: 26 days     Kayleen Memos, MD [facility] Pager [phone]  If 7PM-7AM, please contact night-coverage [email or web address] Password [facility] 06/29/2020, 1:27 PM

## 2020-06-29 NOTE — Progress Notes (Signed)
Occupational Therapy Treatment Patient Details Name: Kathleen Cameron MRN: [id number] DOB: January 14, 1966 Today's Date: 06/29/2020    History of present illness 55 y.o. female admitted 2/13 from home with respiratory distress due to home vent malfunction after same day D/C from [facility] with home vent. PMHx pt with [facility] admission 11/6 with trach 03/02/20 ant transition to [facility] 11/18-2/12, 04/25/19 Gtube placed, COPD, hypertension, depression/anxiety, HLD, shingles infection,   OT comments  Pt progressing towards established OT goals. Pt performing bed mobility with Min-Mod A and tolerating sitting at EOB for 10 minutes. Pt performing AROM exercises for BUE/BLE and scapular retraction exercises to optimize posture. Pt with bowel and urine incontinence in bed. Requiring Min guard A for rolling in bed and then Mod A for peri care. Pt continues to plan for dc to home and will require HHOT. Will continue to follow acutely as admitted.   HR 100-110. SpO2 90s on home vent; PEEP 5, RR 17   Follow Up Recommendations  Home health OT;Supervision/Assistance - 24 hour;Other (comment)    Equipment Recommendations  None recommended by OT    Recommendations for Other Services      Precautions / Restrictions Precautions Precautions: Fall;Other (comment) Precaution Comments: trach, on home vent Restrictions Weight Bearing Restrictions: No       Mobility Bed Mobility Overal bed mobility: Needs Assistance Bed Mobility: Supine to Sit;Sit to Supine Rolling: Min guard Sidelying to sit: Min assist;HOB elevated Supine to sit: Min assist;HOB elevated Sit to supine: Mod assist;+2 for physical assistance Sit to sidelying: Mod assist General bed mobility comments: Pt requiring Min A to bring trunk upright from supine; HOB elevated. Upon return to supine pt requiring Mod A +2 for elevating BLEs and lowering trunk. Pt performing rolling with Min guard A during peri care    Transfers                  General transfer comment: Defered as pt with increased pain at feet    Balance Overall balance assessment: Needs assistance Sitting-balance support: Feet unsupported;No upper extremity supported Sitting balance-Leahy Scale: Fair Sitting balance - Comments: 10 min sitting EOB - Supervision- min guard for safety                                   ADL either performed or assessed with clinical judgement   ADL Overall ADL's : Needs assistance/impaired                             Toileting- Clothing Manipulation and Hygiene: Moderate assistance;Bed level Toileting - Clothing Manipulation Details (indicate cue type and reason): Pt requiring assistance for posterior peri care after bowel incontenience. Pt able to perform anterior peri care supine in bed with HOB elevated.       General ADL Comments: Pt performing bed mobility with sitting at EOB for 10 minutes with Supervision. Pt compelting BLEs and BUE exercises. Pt declining to perform transfer to recliner as she has increased pain at her feet     Vision       Perception     Praxis      Cognition Arousal/Alertness: Awake/alert Behavior During Therapy: WFL for tasks assessed/performed Overall Cognitive Status: Within Functional Limits for tasks assessed  General Comments: pt mouthing words, nodding and following commands. Trach and difficult to read lips at times        Exercises Exercises: General Lower Extremity;General Upper Extremity General Exercises - Upper Extremity Shoulder Flexion: Both;AROM;Seated;5 reps General Exercises - Lower Extremity Ankle Circles/Pumps: AROM;Both;5 reps Long Arc Quad: Both;Seated;15 reps;AROM Other Exercises Other Exercises: scapular retraction and holding ofr 5 sec; x5   Shoulder Instructions       General Comments HR 100-110. SpO2 90s on home vent; PEEP 5, RR 17    Pertinent Vitals/ Pain       Pain  Assessment: Faces Faces Pain Scale: Hurts a little bit Pain Location: Bil feet Pain Descriptors / Indicators: Discomfort Pain Intervention(s): Monitored during session;Limited activity within patient's tolerance;Repositioned  Home Living                                          Prior Functioning/Environment              Frequency  Min 2X/week        Progress Toward Goals  OT Goals(current goals can now be found in the care plan section)  Progress towards OT goals: Progressing toward goals  Acute Rehab OT Goals Patient Stated Goal: strengthening to return home OT Goal Formulation: With patient/family Time For Goal Achievement: 06/29/20 Potential to Achieve Goals: Fair ADL Goals Pt Will Perform Eating: with supervision;with set-up;with adaptive utensils Pt Will Perform Grooming: with set-up;with supervision;sitting Pt Will Perform Upper Body Bathing: with set-up;with supervision;sitting Pt/caregiver will Perform Home Exercise Program: Increased ROM;Both right and left upper extremity;With minimal assist;With written HEP provided Additional ADL Goal #1: Pt will able to sit EOB 10 minutes without LOB for basic ADLs Additional ADL Goal #2: Pt will able to actively reach to 90 degrees with Bil shoulders to grab items while seated EOB or in recliner  Plan Discharge plan remains appropriate    Co-evaluation    PT/OT/SLP Co-Evaluation/Treatment: Yes Reason for Co-Treatment: For patient/therapist safety;To address functional/ADL transfers   OT goals addressed during session: ADL's and self-care      AM-PAC OT "6 Clicks" Daily Activity     Outcome Measure   Help from another person eating meals?: A Little Help from another person taking care of personal grooming?: A Little Help from another person toileting, which includes using toliet, bedpan, or urinal?: A Lot Help from another person bathing (including washing, rinsing, drying)?: A Lot Help from  another person to put on and taking off regular upper body clothing?: A Lot Help from another person to put on and taking off regular lower body clothing?: Total 6 Click Score: 13    End of Session Equipment Utilized During Treatment: Oxygen  OT Visit Diagnosis: Muscle weakness (generalized) (M62.81);Other abnormalities of gait and mobility (R26.89);Pain Pain - Right/Left: Left Pain - part of body: Ankle and joints of foot   Activity Tolerance Patient tolerated treatment well   Patient Left with chair alarm set   Nurse Communication Mobility status        Time: 8916-9450 OT Time Calculation (min): 23 min  Charges: OT General Charges $OT Visit: 1 Visit OT Treatments $Therapeutic Activity: 8-22 mins  Kathleen Cameron MSOT, OTR/L Acute Rehab Pager: [phone] Office: [phone]   Kathleen Cameron 06/29/2020, 12:52 PM

## 2020-06-30 DIAGNOSIS — J9622 Acute and chronic respiratory failure with hypercapnia: Secondary | ICD-10-CM | POA: Diagnosis not present

## 2020-06-30 DIAGNOSIS — J9621 Acute and chronic respiratory failure with hypoxia: Secondary | ICD-10-CM | POA: Diagnosis not present

## 2020-06-30 LAB — COMPREHENSIVE METABOLIC PANEL
ALT: 37 U/L (ref 0–44)
AST: 37 U/L (ref 15–41)
Albumin: 2.1 g/dL — ABNORMAL LOW (ref 3.5–5.0)
Alkaline Phosphatase: 74 U/L (ref 38–126)
Anion gap: 7 (ref 5–15)
BUN: 16 mg/dL (ref 6–20)
CO2: 34 mmol/L — ABNORMAL HIGH (ref 22–32)
Calcium: 9.1 mg/dL (ref 8.9–10.3)
Chloride: 101 mmol/L (ref 98–111)
Creatinine, Ser: 0.41 mg/dL — ABNORMAL LOW (ref 0.44–1.00)
GFR, Estimated: >60 mL/min (ref >60)
Glucose, Bld: 104 mg/dL — ABNORMAL HIGH (ref 70–99)
Potassium: 3.9 mmol/L (ref 3.5–5.1)
Sodium: 142 mmol/L (ref 135–145)
Total Bilirubin: 0.7 mg/dL (ref 0.3–1.2)
Total Protein: 6.1 g/dL — ABNORMAL LOW (ref 6.5–8.1)

## 2020-06-30 LAB — CBC WITH DIFFERENTIAL/PLATELET
Abs Immature Granulocytes: 0.11 10*3/uL — ABNORMAL HIGH (ref 0.00–0.07)
Basophils Absolute: 0.1 10*3/uL (ref 0.0–0.1)
Basophils Relative: 1 %
Eosinophils Absolute: 0.3 10*3/uL (ref 0.0–0.5)
Eosinophils Relative: 3 %
HCT: 25.8 % — ABNORMAL LOW (ref 36.0–46.0)
Hemoglobin: 8 g/dL — ABNORMAL LOW (ref 12.0–15.0)
Immature Granulocytes: 1 %
Lymphocytes Relative: 21 %
Lymphs Abs: 1.9 10*3/uL (ref 0.7–4.0)
MCH: 28.6 pg (ref 26.0–34.0)
MCHC: 31 g/dL (ref 30.0–36.0)
MCV: 92.1 fL (ref 80.0–100.0)
Monocytes Absolute: 0.7 10*3/uL (ref 0.1–1.0)
Monocytes Relative: 8 %
Neutro Abs: 6 10*3/uL (ref 1.7–7.7)
Neutrophils Relative %: 66 %
Platelets: 278 10*3/uL (ref 150–400)
RBC: 2.8 MIL/uL — ABNORMAL LOW (ref 3.87–5.11)
RDW: 16.7 % — ABNORMAL HIGH (ref 11.5–15.5)
WBC: 9 10*3/uL (ref 4.0–10.5)
nRBC: 0 % (ref 0.0–0.2)

## 2020-06-30 LAB — GLUCOSE, CAPILLARY
Glucose-Capillary: 78 mg/dL (ref 70–99)
Glucose-Capillary: 76 mg/dL (ref 70–99)
Glucose-Capillary: 108 mg/dL — ABNORMAL HIGH (ref 70–99)
Glucose-Capillary: 76 mg/dL (ref 70–99)
Glucose-Capillary: 130 mg/dL — ABNORMAL HIGH (ref 70–99)
Glucose-Capillary: 126 mg/dL — ABNORMAL HIGH (ref 70–99)

## 2020-06-30 MED ORDER — PANTOPRAZOLE SODIUM 40 MG PO PACK
40.0000 mg | PACK | Freq: Two times a day (BID) | ORAL | Status: DC
  Administered 2020-06-30 – 2020-07-02 (×4): 40 mg
  Filled 2020-06-30 (×6): qty 20

## 2020-06-30 NOTE — Progress Notes (Addendum)
PROGRESS NOTE  Kathleen Cameron WNI:[id number] DOB: Dec 22, 1965 DOA: 06/03/2020 PCP: Horald Pollen, MD  HPI/Recap of past 19 hours: 55 year old black female Known severe COPD chronic hypoxic hypercapnic respiratory failure Seizure disorder HTN Rx 03/10/2020 chronic respiratory failure-eventually trach 03/02/2020 [facility] and then went home on 2/11 Apparently home ventilator malfunctioned = respiratory failure bag mask ventilation  She was admitted with type I-type II respiratory failure secondary to COPD She was found to have left subclavian DVT and placed on Eliquis Because of anemia and positive FOBT , She underwent EGD and colonoscopy = 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  06/30/20: Seen at bedside.  There were no acute events overnight.  She has no new complaints this morning.  Assessment/Plan: Principal Problem:   Acute on chronic respiratory failure with hypoxia and hypercapnia (HCC) Active Problems:   Hypothyroidism (acquired)   Acute respiratory failure (HCC)   COPD, severe (HCC)   Anxiety disorder   Sepsis ([place])   Healthcare-associated pneumonia   Pressure injury of skin   Tracheostomy status ([place])  Chronic type I + 2 respiratory failure 2/2 COPD             Requires home mechanical ventilation---apparently this       cannot be set up until 3/14 because of lack of home           health nursing staff-I will asked TOC to clarify             Continue albuterol every 6 as needed Brovana 15 twice      daily Pulmicort 2 puffs daily and [place] pulmonology assistance-we will be going to        get trach clinic follow-up on Monday apparently             We will CC Mr. Kary Kos on this note so that he is    aware of difficulties getting to the outpatient setting with      ventilator Continues on Osmolite 237 twice daily in addition to 3 times daily between meals Ensure-giving free  water 160 twice daily Cut back fentanyl to 25/50 every 2 as needed to promote daytime wakefulness  Severe sepsis secondary to develop versus HAP on admission   staph hominis bacteremia?  Contaminant             Completed antibiotics 7 days total on 2/18 Periodic lab rechecks  Left subclavian DVT provoked             Will require 3 months of anticoagulation through       09/09/2020             Discussed and clarified with Dr. Benson Norway of GI that patient       can start Eliquis and monitor for further bleeding over       the next 24 hours  Normocytic anemia superimposed on duodenal bleed             Iron studies earlier in admission 3 6 show low sat ratios             Will give Nulecit infusion unless it has been given     recently Drop in Hg this AM Repeat H&H in the AM Low threshold to transfuse for symptomatic anemia  HTN On amlodipine 10, metoprolol as needed 5 mg every 5 minutes above 120 Pressures are relatively well  controlled  Anxiety             Continue temazepam 15 at bedtime, Effexor 37.5 twice       daily              Right lower extremity pains             Desquamating skin over lower extremities-we will     request wound nurse to see and give opinion   pressure ulcer As above           DVT prophylaxis: Eliquis Code Status: Full CODE STATUS  family Communication:  Kathleen Cameron   [phone]  DIscussed and updated in detail 3/10  Disposition:  Status is: Inpatient  Remains inpatient appropriate because:Hemodynamically unstable, Ongoing active pain requiring inpatient pain management and Altered mental status   Dispo: The patient is from: Home  Anticipated d/c is to: Home  Patient currently is not medically stable to d/c.              Difficult to place patient No    Consultants:   Gi dr. hung  Procedures:   Antimicrobials: finished     Objective: Vitals:   06/30/20 1121 06/30/20 1200 06/30/20 1224  06/30/20 1300  BP:  112/90  115/89  Pulse:  97  93  Resp:  16  16  Temp: 98.6 F (37 C)     TempSrc: Oral     SpO2:  100% 100% 100%  Weight:      Height:        Intake/Output Summary (Last 24 hours) at 06/30/2020 1359 Last data filed at 06/30/2020 0931 Gross per 24 hour  Intake 757 ml  Output 925 ml  Net -168 ml   Filed Weights   06/28/20 0348 06/29/20 0500 06/30/20 0500  Weight: 57 kg 59.1 kg 59.3 kg    Exam: No significant changes from prior exam.  . General: 55 y.o. year-old female well developed well nourished in no acute distress.  Alert and oriented x3. . Cardiovascular: Regular rate and rhythm with no rubs or gallops.  [name] Respiratory: Mild rales at bases.  On vent.  Poor inspiratory effort. . Abdomen: Soft nontender nondistended with normal bowel sounds x4 quadrants. . Musculoskeletal: Trace lower and upper extremity edema. [name] Psychiatry: Mood is appropriate for condition and setting   Data Reviewed: CBC: Recent Labs  Lab 06/26/20 0356 06/27/20 0111 06/28/20 0134 06/29/20 0155 06/30/20 0111  WBC 8.8 6.9 6.2 7.2 9.0  NEUTROABS  --   --   --  4.5 6.0  HGB 8.7* 8.1* 8.0* 7.3* 8.0*  HCT 27.3* 25.3* 25.6* 24.0* 25.8*  MCV 90.4 90.7 92.4 92.0 92.1  PLT 230 244 239 241 340   Basic Metabolic Panel: Recent Labs  Lab 06/27/20 0111 06/27/20 1611 06/28/20 0134 06/29/20 0155 06/30/20 0111  NA 140 142 142 138 142  K 3.4* 3.7 4.1 3.9 3.9  CL 101 101 101 99 101  CO2 30 31 34* 35* 34*  GLUCOSE 125* 100* 133* 124* 104*  BUN _0 CREATININE 0.39* 0.38* 0.37* 0.42* 0.41*  CALCIUM 9.2 9.4 9.1 9.1 9.1  MG  --  1.9 1.9  --   --    GFR: Estimated Creatinine Clearance: 67.8 mL/min (A) (by C-G formula based on SCr of 0.41 mg/dL (L)). Liver Function Tests: Recent Labs  Lab 06/29/20 0155 06/30/20 0111  AST 33 37  ALT 35 37  ALKPHOS 70 74  BILITOT 0.3  0.7  PROT 5.7* 6.1*  ALBUMIN 2.0* 2.1*   No results for input(s): LIPASE, AMYLASE in the last 168  hours. No results for input(s): AMMONIA in the last 168 hours. Coagulation Profile: No results for input(s): INR, PROTIME in the last 168 hours. Cardiac Enzymes: No results for input(s): CKTOTAL, CKMB, CKMBINDEX, TROPONINI in the last 168 hours. BNP (last 3 results) No results for input(s): PROBNP in the last 8760 hours. HbA1C: No results for input(s): HGBA1C in the last 72 hours. CBG: Recent Labs  Lab 06/29/20 1911 06/29/20 2307 06/30/20 0304 06/30/20 0720 06/30/20 1104  GLUCAP 98 104* 78 76 108*   Lipid Profile: No results for input(s): CHOL, HDL, LDLCALC, TRIG, CHOLHDL, LDLDIRECT in the last 72 hours. Thyroid Function Tests: No results for input(s): TSH, T4TOTAL, FREET4, T3FREE, THYROIDAB in the last 72 hours. Anemia Panel: No results for input(s): VITAMINB12, FOLATE, FERRITIN, TIBC, IRON, RETICCTPCT in the last 72 hours. Urine analysis:    Component Value Date/Time   COLORURINE YELLOW 03/20/2020 0018   APPEARANCEUR HAZY (A) 03/20/2020 0018   LABSPEC 1.006 03/20/2020 0018   PHURINE 5.0 03/20/2020 0018   GLUCOSEU NEGATIVE 03/20/2020 0018   HGBUR NEGATIVE 03/20/2020 0018   BILIRUBINUR NEGATIVE 03/20/2020 0018   KETONESUR NEGATIVE 03/20/2020 0018   PROTEINUR NEGATIVE 03/20/2020 0018   UROBILINOGEN 1.0 11/12/2008 1440   NITRITE NEGATIVE 03/20/2020 0018   LEUKOCYTESUR NEGATIVE 03/20/2020 0018   Sepsis Labs: _0 (procalcitonin:4,lacticidven:4)  )No results found for this or any previous visit (from the past 240 hour(s)).    Studies: No results found.  Scheduled Meds: . sodium chloride   Intravenous Once  . sodium chloride   Intravenous Once  . amLODipine  5 mg Per Tube Daily  . apixaban  5 mg Per Tube BID  . arformoterol  15 mcg Nebulization BID  . aspirin  81 mg Per Tube Daily  . budesonide  0.25 mg Nebulization BID  . chlorhexidine gluconate (MEDLINE KIT)  15 mL Mouth Rinse BID  . Chlorhexidine Gluconate Cloth  6 each Topical Q0600  . clonazePAM  1 mg  Per Tube BID  . colchicine  0.6 mg Per Tube Daily  . feeding supplement  237 mL Oral TID BM  . feeding supplement (OSMOLITE 1.5 CAL)  237 mL Per Tube BID  . feeding supplement (PROSource TF)  45 mL Per Tube BID  . free water  160 mL Per Tube BID  . hydrocerin   Topical Daily  . insulin aspart  0-9 Units Subcutaneous Q4H  . levothyroxine  50 mcg Per Tube Q0600  . mouth rinse  15 mL Mouth Rinse 10 times per day  . multivitamin with minerals  1 tablet Per Tube Daily  . pantoprazole sodium  40 mg Per Tube BID  . revefenacin  175 mcg Nebulization Daily  . rosuvastatin  40 mg Per Tube Daily  . temazepam  15 mg Per Tube QHS  . venlafaxine  37.5 mg Per Tube BID WC    Continuous Infusions: . sodium chloride 10 mL/hr at 06/28/20 1409     LOS: 27 days     Kayleen Memos, MD [facility] Pager [phone]  If 7PM-7AM, please contact night-coverage [email or web address] Password [facility] 06/30/2020, 1:59 PM

## 2020-07-01 DIAGNOSIS — J9621 Acute and chronic respiratory failure with hypoxia: Secondary | ICD-10-CM | POA: Diagnosis not present

## 2020-07-01 DIAGNOSIS — J9622 Acute and chronic respiratory failure with hypercapnia: Secondary | ICD-10-CM | POA: Diagnosis not present

## 2020-07-01 LAB — COMPREHENSIVE METABOLIC PANEL
ALT: 41 U/L (ref 0–44)
AST: 42 U/L — ABNORMAL HIGH (ref 15–41)
Albumin: 2.2 g/dL — ABNORMAL LOW (ref 3.5–5.0)
Alkaline Phosphatase: 75 U/L (ref 38–126)
Anion gap: 5 (ref 5–15)
BUN: 17 mg/dL (ref 6–20)
CO2: 34 mmol/L — ABNORMAL HIGH (ref 22–32)
Calcium: 9.2 mg/dL (ref 8.9–10.3)
Chloride: 99 mmol/L (ref 98–111)
Creatinine, Ser: 0.42 mg/dL — ABNORMAL LOW (ref 0.44–1.00)
GFR, Estimated: >60 mL/min (ref >60)
Glucose, Bld: 84 mg/dL (ref 70–99)
Potassium: 4 mmol/L (ref 3.5–5.1)
Sodium: 138 mmol/L (ref 135–145)
Total Bilirubin: 0.6 mg/dL (ref 0.3–1.2)
Total Protein: 6.3 g/dL — ABNORMAL LOW (ref 6.5–8.1)

## 2020-07-01 LAB — CBC WITH DIFFERENTIAL/PLATELET
Abs Immature Granulocytes: 0.11 10*3/uL — ABNORMAL HIGH (ref 0.00–0.07)
Basophils Absolute: 0 10*3/uL (ref 0.0–0.1)
Basophils Relative: 0 %
Eosinophils Absolute: 0.3 10*3/uL (ref 0.0–0.5)
Eosinophils Relative: 3 %
HCT: 25.2 % — ABNORMAL LOW (ref 36.0–46.0)
Hemoglobin: 7.9 g/dL — ABNORMAL LOW (ref 12.0–15.0)
Immature Granulocytes: 1 %
Lymphocytes Relative: 21 %
Lymphs Abs: 2 10*3/uL (ref 0.7–4.0)
MCH: 28.5 pg (ref 26.0–34.0)
MCHC: 31.3 g/dL (ref 30.0–36.0)
MCV: 91 fL (ref 80.0–100.0)
Monocytes Absolute: 0.7 10*3/uL (ref 0.1–1.0)
Monocytes Relative: 8 %
Neutro Abs: 6.4 10*3/uL (ref 1.7–7.7)
Neutrophils Relative %: 67 %
Platelets: 293 10*3/uL (ref 150–400)
RBC: 2.77 MIL/uL — ABNORMAL LOW (ref 3.87–5.11)
RDW: 16.8 % — ABNORMAL HIGH (ref 11.5–15.5)
WBC: 9.5 10*3/uL (ref 4.0–10.5)
nRBC: 0 % (ref 0.0–0.2)

## 2020-07-01 LAB — GLUCOSE, CAPILLARY
Glucose-Capillary: 110 mg/dL — ABNORMAL HIGH (ref 70–99)
Glucose-Capillary: 81 mg/dL (ref 70–99)
Glucose-Capillary: 102 mg/dL — ABNORMAL HIGH (ref 70–99)
Glucose-Capillary: 110 mg/dL — ABNORMAL HIGH (ref 70–99)
Glucose-Capillary: 102 mg/dL — ABNORMAL HIGH (ref 70–99)
Glucose-Capillary: 111 mg/dL — ABNORMAL HIGH (ref 70–99)

## 2020-07-01 NOTE — Progress Notes (Signed)
PROGRESS NOTE  Kathleen Cameron AST:[id number] DOB: 05-05-1965 DOA: 06/03/2020 PCP: Horald Pollen, MD  HPI/Recap of past 42 hours: 55 year old black female Known severe COPD chronic hypoxic hypercapnic respiratory failure Seizure disorder HTN Rx 03/10/2020 chronic respiratory failure-eventually trach 03/02/2020 [facility] and then went home on 2/11 Apparently home ventilator malfunctioned = respiratory failure bag mask ventilation  She was admitted with type I-type II respiratory failure secondary to COPD She was found to have left subclavian DVT and placed on Eliquis Because of anemia and positive FOBT , She underwent EGD and colonoscopy = 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  07/01/20: Seen and examined at her bedside.  She has no new complaints.  She had a bowel movement today.  Assessment/Plan: Principal Problem:   Acute on chronic respiratory failure with hypoxia and hypercapnia (HCC) Active Problems:   Hypothyroidism (acquired)   Acute respiratory failure (HCC)   COPD, severe (HCC)   Anxiety disorder   Sepsis ([place])   Healthcare-associated pneumonia   Pressure injury of skin   Tracheostomy status ([place])  Chronic type I + 2 respiratory failure 2/2 COPD             Requires home mechanical ventilation---apparently this       cannot be set up until 3/14 because of lack of home           health nursing staff-I will asked TOC to clarify             Continue albuterol every 6 as needed Brovana 15 twice      daily Pulmicort 2 puffs daily and [place] pulmonology assistance-we will be going to        get trach clinic follow-up on Monday apparently             We will CC Mr. Kary Kos on this note so that he is    aware of difficulties getting to the outpatient setting with      ventilator Continues on Osmolite 237 twice daily in addition to 3 times daily between meals Ensure-giving free  water 160 twice daily Cut back fentanyl to 25/50 every 2 as needed to promote daytime wakefulness  Severe sepsis secondary to develop versus HAP on admission   staph hominis bacteremia?  Contaminant             Completed antibiotics 7 days total on 2/18 Periodic lab rechecks  Left subclavian DVT provoked             Will require 3 months of anticoagulation through       09/09/2020             Discussed and clarified with Dr. Benson Norway of GI that patient       can start Eliquis and monitor for further bleeding over       the next 24 hours  Normocytic anemia superimposed on duodenal bleed             Iron studies earlier in admission 3 6 show low sat ratios             Will give Nulecit infusion unless it has been given     recently Drop in Hg this AM Repeat H&H in the AM Low threshold to transfuse for symptomatic anemia  HTN On amlodipine 10, metoprolol as needed 5 mg every 5 minutes above 120 Pressures are relatively  well controlled  Anxiety             Continue temazepam 15 at bedtime, Effexor 37.5 twice       daily              Right lower extremity pains             Desquamating skin over lower extremities-we will     request wound nurse to see and give opinion   pressure ulcer As above           DVT prophylaxis: Eliquis Code Status: Full CODE STATUS  family Communication:  [name]   [phone]  DIscussed and updated in detail 3/10  Disposition:  Status is: Inpatient  Remains inpatient appropriate because:Hemodynamically unstable, Ongoing active pain requiring inpatient pain management and Altered mental status   Dispo: The patient is from: Home  Anticipated d/c is to: Home  Patient currently is not medically stable to d/c.              Difficult to place patient No    Consultants:   Gi dr. hung  Procedures:   Antimicrobials: finished     Objective: Vitals:   07/01/20 1000 07/01/20 1100 07/01/20 1200  07/01/20 1222  BP: 114/89 118/84 120/82   Pulse: 95 93 93   Resp: $Remo'16 16 16   'CJymm$ Temp:      TempSrc:      SpO2: 100% 100% 99% 99%  Weight:      Height:        Intake/Output Summary (Last 24 hours) at 07/01/2020 1340 Last data filed at 07/01/2020 1200 Gross per 24 hour  Intake 1100 ml  Output 600 ml  Net 500 ml   Filed Weights   06/29/20 0500 06/30/20 0500 07/01/20 0500  Weight: 59.1 kg 59.3 kg 57.6 kg    Exam: No significant changes from prior exam.  . General: 55 y.o. year-old female well developed well nourished in no acute distress.  Alert and oriented x3. . Cardiovascular: Regular rate and rhythm with no rubs or gallops.  [name] Respiratory: Mild rales at bases.  On vent.  Poor inspiratory effort. . Abdomen: Soft nontender nondistended with normal bowel sounds x4 quadrants. . Musculoskeletal: Trace lower and upper extremity edema. [name] Psychiatry: Mood is appropriate for condition and setting   Data Reviewed: CBC: Recent Labs  Lab 06/27/20 0111 06/28/20 0134 06/29/20 0155 06/30/20 0111 07/01/20 0634  WBC 6.9 6.2 7.2 9.0 9.5  NEUTROABS  --   --  4.5 6.0 6.4  HGB 8.1* 8.0* 7.3* 8.0* 7.9*  HCT 25.3* 25.6* 24.0* 25.8* 25.2*  MCV 90.7 92.4 92.0 92.1 91.0  PLT 244 239 241 278 381   Basic Metabolic Panel: Recent Labs  Lab 06/27/20 1611 06/28/20 0134 06/29/20 0155 06/30/20 0111 07/01/20 0634  NA 142 142 138 142 138  K 3.7 4.1 3.9 3.9 4.0  CL 101 101 99 101 99  CO2 31 34* 35* 34* 34*  GLUCOSE 100* 133* 124* 104* 84  BUN $Re'17 17 17 16 17  'cfV$ CREATININE 0.38* 0.37* 0.42* 0.41* 0.42*  CALCIUM 9.4 9.1 9.1 9.1 9.2  MG 1.9 1.9  --   --   --    GFR: Estimated Creatinine Clearance: 67.8 mL/min (A) (by C-G formula based on SCr of 0.42 mg/dL (L)). Liver Function Tests: Recent Labs  Lab 06/29/20 0155 06/30/20 0111 07/01/20 0634  AST 33 37 42*  ALT 35 37 41  ALKPHOS 70 74 75  BILITOT  0.3 0.7 0.6  PROT 5.7* 6.1* 6.3*  ALBUMIN 2.0* 2.1* 2.2*   No results for input(s):  LIPASE, AMYLASE in the last 168 hours. No results for input(s): AMMONIA in the last 168 hours. Coagulation Profile: No results for input(s): INR, PROTIME in the last 168 hours. Cardiac Enzymes: No results for input(s): CKTOTAL, CKMB, CKMBINDEX, TROPONINI in the last 168 hours. BNP (last 3 results) No results for input(s): PROBNP in the last 8760 hours. HbA1C: No results for input(s): HGBA1C in the last 72 hours. CBG: Recent Labs  Lab 06/30/20 1919 06/30/20 2309 07/01/20 0312 07/01/20 0735 07/01/20 1217  GLUCAP 130* 126* 110* 81 102*   Lipid Profile: No results for input(s): CHOL, HDL, LDLCALC, TRIG, CHOLHDL, LDLDIRECT in the last 72 hours. Thyroid Function Tests: No results for input(s): TSH, T4TOTAL, FREET4, T3FREE, THYROIDAB in the last 72 hours. Anemia Panel: No results for input(s): VITAMINB12, FOLATE, FERRITIN, TIBC, IRON, RETICCTPCT in the last 72 hours. Urine analysis:    Component Value Date/Time   COLORURINE YELLOW 03/20/2020 0018   APPEARANCEUR HAZY (A) 03/20/2020 0018   LABSPEC 1.006 03/20/2020 0018   PHURINE 5.0 03/20/2020 0018   GLUCOSEU NEGATIVE 03/20/2020 0018   HGBUR NEGATIVE 03/20/2020 0018   BILIRUBINUR NEGATIVE 03/20/2020 0018   KETONESUR NEGATIVE 03/20/2020 0018   PROTEINUR NEGATIVE 03/20/2020 0018   UROBILINOGEN 1.0 11/12/2008 1440   NITRITE NEGATIVE 03/20/2020 0018   LEUKOCYTESUR NEGATIVE 03/20/2020 0018   Sepsis Labs: $RemoveBefo'@LABRCNTIP'BnjlhYbMwzQ$ (procalcitonin:4,lacticidven:4)  )No results found for this or any previous visit (from the past 240 hour(s)).    Studies: No results found.  Scheduled Meds: . sodium chloride   Intravenous Once  . sodium chloride   Intravenous Once  . amLODipine  5 mg Per Tube Daily  . apixaban  5 mg Per Tube BID  . arformoterol  15 mcg Nebulization BID  . aspirin  81 mg Per Tube Daily  . budesonide  0.25 mg Nebulization BID  . chlorhexidine gluconate (MEDLINE KIT)  15 mL Mouth Rinse BID  . Chlorhexidine Gluconate Cloth  6  each Topical Q0600  . clonazePAM  1 mg Per Tube BID  . colchicine  0.6 mg Per Tube Daily  . feeding supplement  237 mL Oral TID BM  . feeding supplement (OSMOLITE 1.5 CAL)  237 mL Per Tube BID  . feeding supplement (PROSource TF)  45 mL Per Tube BID  . free water  160 mL Per Tube BID  . hydrocerin   Topical Daily  . insulin aspart  0-9 Units Subcutaneous Q4H  . levothyroxine  50 mcg Per Tube Q0600  . mouth rinse  15 mL Mouth Rinse 10 times per day  . multivitamin with minerals  1 tablet Per Tube Daily  . pantoprazole sodium  40 mg Per Tube BID  . revefenacin  175 mcg Nebulization Daily  . rosuvastatin  40 mg Per Tube Daily  . temazepam  15 mg Per Tube QHS  . venlafaxine  37.5 mg Per Tube BID WC    Continuous Infusions: . sodium chloride 10 mL/hr at 06/28/20 1409     LOS: 28 days     Kayleen Memos, MD [facility] Pager [phone]  If 7PM-7AM, please contact night-coverage [email or web address] Password [email or web address] 07/01/2020, 1:40 PM

## 2020-07-02 ENCOUNTER — Other Ambulatory Visit: Payer: Self-pay | Admitting: Internal Medicine

## 2020-07-02 ENCOUNTER — Encounter: Payer: Self-pay | Admitting: Pulmonary Disease

## 2020-07-02 ENCOUNTER — Other Ambulatory Visit: Payer: Self-pay

## 2020-07-02 ENCOUNTER — Inpatient Hospital Stay: Payer: Self-pay | Admitting: Emergency Medicine

## 2020-07-02 DIAGNOSIS — Z9911 Dependence on respirator [ventilator] status: Secondary | ICD-10-CM | POA: Diagnosis not present

## 2020-07-02 DIAGNOSIS — J9621 Acute and chronic respiratory failure with hypoxia: Secondary | ICD-10-CM | POA: Diagnosis not present

## 2020-07-02 DIAGNOSIS — J449 Chronic obstructive pulmonary disease, unspecified: Secondary | ICD-10-CM | POA: Diagnosis not present

## 2020-07-02 DIAGNOSIS — J9622 Acute and chronic respiratory failure with hypercapnia: Secondary | ICD-10-CM | POA: Diagnosis not present

## 2020-07-02 LAB — GLUCOSE, CAPILLARY
Glucose-Capillary: 67 mg/dL — ABNORMAL LOW (ref 70–99)
Glucose-Capillary: 79 mg/dL (ref 70–99)
Glucose-Capillary: 93 mg/dL (ref 70–99)
Glucose-Capillary: 125 mg/dL — ABNORMAL HIGH (ref 70–99)

## 2020-07-02 MED ORDER — APIXABAN 5 MG PO TABS: 5.0000 mg | ORAL_TABLET | Freq: Two times a day (BID) | ORAL | 0 refills | Status: DC

## 2020-07-02 MED ORDER — ADULT MULTIVITAMIN W/MINERALS CH: 1.0000 | ORAL_TABLET | Freq: Every day | ORAL | 0 refills | Status: DC

## 2020-07-02 MED ORDER — COLCHICINE 0.6 MG PO TABS: 0.6000 mg | ORAL_TABLET | Freq: Every day | ORAL | 0 refills | Status: DC

## 2020-07-02 MED ORDER — OXYCODONE-ACETAMINOPHEN 5-325 MG PO TABS: 1.0000 | ORAL_TABLET | Freq: Three times a day (TID) | ORAL | 0 refills | Status: DC | PRN

## 2020-07-02 NOTE — Progress Notes (Signed)
NAME:  Kathleen Cameron, MRN:  [id number], DOB:  04-13-1966, LOS: 29 ADMISSION DATE:  06/03/2020, CONSULTATION DATE:  2/12 REFERRING MD:  Pilar Plate, CHIEF COMPLAINT:  Dyspnea   Brief History:  55 year old female presented from home via EMS with complaints of acute respiratory distress after home ventilator failed.  [facility]  PCCM consulted for further management and admission. Diagnosed with HCAP and COPD exacerbation on admission.   Past Medical History:  Seizure disorder Hypertension Severe COPD Acute on chronic hypoxic and hypercapnic respiratory failure Anxiety  Significant Hospital Events:  Admitted 2/13, treated for hcap/copd exac 2/16 home vent 3/1 foot pain/elbow pain 3/3 podagra  Consults:  FEES 2/25 > without PMSV no aspiration, rec regular texture diet with thin liquids GI  Procedures:  Tracheostomy present prior to admission EGD 3/8  Significant Diagnostic Tests:  2/23 Vas ultrasound upper extre> left subclavian DVT and superficial vein thrombosis 2/25 SLP  Micro Data:  2/13 sars cov 2/flu > negative 2/13 blood > staph hominis 1/4 2/13 resp > OPF 2/16 blood > No growth  Antimicrobials:  Cefepime 2/13 > 2/19 Vanc 2/13 x1  3/1 doxycycline  Interim History / Subjective:  Hopefully going home with husband today with MV and home nursing support.   Objective   Blood pressure (!) 128/95, pulse 94, temperature 98.8 F (37.1 C), temperature source Oral, resp. rate 16, height 5\' 4"  (1.626 m), weight 60.6 kg, SpO2 100 %.    Vent Mode: AC FiO2 (%):  [40 %] 40 % Set Rate:  [16 bmp] 16 bmp Vt Set:  [480 mL] 480 mL PEEP:  [5 cmH20] 5 cmH20 Plateau Pressure:  [13 cmH20-22 cmH20] 13 cmH20   Intake/Output Summary (Last 24 hours) at 07/02/2020 07/04/2020 Last data filed at 07/02/2020 0900 Gross per 24 hour  Intake 2714 ml  Output 1150 ml  Net 1564 ml   Filed Weights   06/30/20 0500 07/01/20 0500 07/02/20 0221  Weight: 59.3 kg 57.6 kg  60.6 kg    Examination: General: chronically ill appearing woman sitting up in bed in NAD HENT: NCAT, eyes anicteric, oral mucosa moist Neck: trach in place, no bleeding or drainage PULM: distant breath sounds, thick tracheal secretions, moderate volume CV: S1S2, RRR GI: soft, NT, ND MSK: mild edema in all extremities, no cyanosis Derm: warm, dry Neuro: awake, moving all extremities spontaneously, answering questions with mouthing words and nodding Psych: cooperative with exam   Resolved Hospital Problem list     Assessment & Plan:   Chronic hypoxemic and hypercarbic respiratory failure due to COPD S/p tracheostomy, chronic vent- dependent -Continue routine trach and vent care. On her home vent currently. Husband has been previously trained for her discharge care by her previous LTAC. -Speaking valve as tolerated (inline with MV) -Trach clinic follow-up with NP Babcock on 4/27. Can bring results of home Covid test for her appointment.   Supplies needed for home tracheostomy and ventilator use: -Home ventilator and backup circuit tubing. Tubing should be changed every 2 weeks. HME should be changed monthly. -Home suction unit with backup battery. Needs tracheal suctioning kits- likely 2-4 per day. -Supply of tracheostomy tubes (Shiley Legacy #6 cuffed); always needs 1 in the next smaller size as well (Shiley #4 cuffed). Change trach and trach tie monthly or sooner if needed.  -Inner cannulas to be changed daily. -Gauze pads for daily changes. -Lubricant packs to assist with tracheostomy placement. -Ambubag with bag valve mask and attachment to tracheostomy tube for assisted ventilation  if ventilator fails  Provoked L Subclavian DVT -Con't Eliquis BID; needs 3-6 months of treatment for provoked clot.  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  Home health assessment and assistance  Best practice (evaluated daily)    Goals of Care:    Labs   CBC: Recent Labs  Lab 06/27/20 0111 06/28/20 0134 06/29/20 0155 06/30/20 0111 07/01/20 0634  WBC 6.9 6.2 7.2 9.0 9.5  NEUTROABS  --   --  4.5 6.0 6.4  HGB 8.1* 8.0* 7.3* 8.0* 7.9*  HCT 25.3* 25.6* 24.0* 25.8* 25.2*  MCV 90.7 92.4 92.0 92.1 91.0  PLT 244 239 241 278 293    Basic Metabolic Panel: Recent Labs  Lab 06/27/20 1611 06/28/20 0134 06/29/20 0155 06/30/20 0111 07/01/20 0634  NA 142 142 138 142 138  K 3.7 4.1 3.9 3.9 4.0  CL 101 101 99 101 99  CO2 31 34* 35* 34* 34*  GLUCOSE 100* 133* 124* 104* 84  BUN 17 17 17 16 17   CREATININE 0.38* 0.37* 0.42* 0.41* 0.42*  CALCIUM 9.4 9.1 9.1 9.1 9.2  MG 1.9 1.9  --   --   --    GFR: Estimated Creatinine Clearance: 67.8 mL/min (A) (by C-G formula based on SCr of 0.42 mg/dL (L)). Recent Labs  Lab 06/28/20 0134 06/29/20 0155 06/30/20 0111 07/01/20 0634  WBC 6.2 7.2 9.0 9.5    Liver Function Tests: Recent Labs  Lab 06/29/20 0155 06/30/20 0111 07/01/20 0634  AST 33 37 42*  ALT 35 37 41  ALKPHOS 70 74 75  BILITOT 0.3 0.7 0.6  PROT 5.7* 6.1* 6.3*  ALBUMIN 2.0* 2.1* 2.2*   No results for input(s): LIPASE, AMYLASE in the last 168 hours. No results for input(s): AMMONIA in the last 168 hours.  ABG    Component Value Date/Time   PHART 7.379 06/27/2020 2247   PCO2ART 58.4 (H) 06/27/2020 2247   PO2ART 104 06/27/2020 2247   HCO3 33.8 (H) 06/27/2020 2247   TCO2 35 (H) 06/03/2020 0052   O2SAT 98.1 06/27/2020 2247    Coagulation Profile: No results for input(s): INR, PROTIME in the last 168 hours.  Cardiac Enzymes: No results for input(s): CKTOTAL, CKMB, CKMBINDEX, TROPONINI in the last 168 hours.  HbA1C: Hgb A1c MFr Bld  Date/Time Value Ref Range Status  06/21/2020 02:24 AM 5.3 4.8 - 5.6 % Final    Comment:    (NOTE) Pre diabetes:           5.7%-6.4%  Diabetes:              >6.4%  Glycemic control for   <7.0% adults with diabetes   02/26/2020 05:18 PM 5.4 4.8 - 5.6 % Final    Comment:    (NOTE) Pre diabetes:          5.7%-6.4%  Diabetes:              >6.4%  Glycemic control for   <7.0% adults with diabetes     CBG: Recent Labs  Lab 07/01/20 1908 07/01/20 2306 07/02/20 0310 07/02/20 0405 07/02/20 0718  GLUCAP 102* 111* 67* 79 93    This patient is critically ill with multiple organ system failure which requires frequent high complexity decision making, assessment, support, evaluation, and titration of therapies. This was completed through the application of advanced monitoring technologies  and extensive interpretation of multiple databases. During this encounter critical care time was devoted to patient care services described in this note for 45 minutes.    Steffanie Dunn, DO 07/02/20 10:24 AM [facility]  From 7AM- 7PM if no response to pager, please call [phone]. After hours, 7PM- 7AM, please call Elink  [phone].

## 2020-07-02 NOTE — Progress Notes (Signed)
Patient discharged to home via [facility] with husband at her side. I have gone over discharge instructions and trach supply per Dr Ophelia Charter instructions with Earvin Hansen and answered his very appropriate questions

## 2020-07-02 NOTE — TOC Transition Note (Addendum)
Transition of Care [facility]) - CM/SW Discharge Note   Patient Details  Name: Kathleen Cameron MRN: [id number] Date of Birth: 02-18-1966  Transition of Care [facility]) CM/SW Contact:  Epifanio Lesches, RN Phone Number: 07/02/2020, 11:52 AM   Clinical Narrative:    Patient will DC to: Home Anticipated DC date: 07/02/2020 Family notified: husband Transport by: Sharin Mons   Admitted with respiratory distress due to home vent malfunction after same day D/C from [facility] with home vent. PMHx pt with [facility] admission 11/6 with trach 03/02/20 ant transition to [facility] 11/18-2/12, 04/25/19 Gtube placed, COPD, hypertension, depression/anxiety, HLD, shingles infection. Per MD patient ready for DC today. RN, patient, patient's husband, [facility] and [employer] [facility], Zeb Comfort [phone]) notified of DC. RN to call report prior to discharge ( [phone]) to [facility]. DC packet on chart. Ambulance transport requested for patient, husband to accompany pt in ambulance (husband vent. Trained).  TOC  Pharmacy to provide pt with Rx meds prior to d/c @ bedside.  RNCM will sign off for now as intervention is no longer needed. Please consult [place] again if new needs arise.    Final next level of care: Home w Home Health Services [facility] , Adult Nursing) Barriers to Discharge: No Barriers Identified   Patient Goals and CMS Choice Patient states their goals for this hospitalization and ongoing recovery are:: to go home   Choice offered to / list presented to : Spouse  Discharge Placement                       Discharge Plan and Services   Discharge Planning Services: CM Consult Post Acute Care Choice: Home Health,Durable Medical Equipment                    HH Arranged: RN [facility] Agency: [facility] Care Date [facility] Agency Contacted: 07/02/20 Time HH Agency Contacted: 1152 Representative spoke with at [facility] Agency: Rob [phone])  Social Determinants of  Health (SDOH) Interventions     Readmission Risk Interventions Readmission Risk Prevention Plan 02/02/2020  Transportation Screening Complete  PCP or Specialist Appt within 3-5 Days Complete  HRI or Home Care Consult Complete  Social Work Consult for Recovery Care Planning/Counseling Complete  Palliative Care Screening Not Applicable  Medication Review Oceanographer) Complete  Some recent data might be hidden

## 2020-07-02 NOTE — Progress Notes (Signed)
Hypoglycemic Event  CBG: 67  Treatment: 4 oz juice/soda  Symptoms: None  Follow-up CBG: Time:0407 CBG Result:79  Possible Reasons for Event: Inadequate meal intake      Kathleen Cameron

## 2020-07-02 NOTE — Discharge Instructions (Addendum)
Chronic Respiratory Failure  Respiratory failure is a condition in which the lungs do not work well and the breathing (respiratory) system fails. When respiratory failure occurs, it becomes difficult for the lungs to get enough oxygen or to eliminate carbon dioxide (or both). If the lungs do not work properly, the heart, brain, and other body systems do not get enough oxygen. Respiratory failure is life-threatening if not treated. Respiratory failure can be acute or chronic. Acute respiratory failure is sudden and severe and requires emergency medical treatment. Chronic respiratory failure happens over time--months to years--and is usually due to a medical condition that gets worse. What are the causes? This condition may be caused by any problem that affects the heart or lungs. Causes include:  Lung or airway disease, such as: ? Chronic bronchitis and emphysema (COPD). ? Asthma. ? Cystic fibrosis. ? Pulmonary hypertension. ? Pulmonary fibrosis. This is scarring of the lung tissue.  Infection, such as pneumonia.  Nerve or muscle injury or diseases that make chest movements difficult, such as: ? Lou Gehrig's disease. ? Guillain-Barre syndrome. ? Stroke. ? Spinal cord injuries.  Fluid in the lungs (pulmonary edema). This may be due to heart failure or lung injury.  A blood clot in a lung (pulmonary embolism).  Trauma to the chest that makes breathing difficult. This may include a collapsed lung (pneumothorax). What increases the risk? You are more likely to develop this condition if:  You smoke or vape, have a history of smoking or vaping, or have exposure to secondhand smoke.  You have a weak immune system.  You have a family history of breathing problems or lung disease.  You have sleep apnea.  You have congestive heart failure.  You are obese.  You have been exposed to hazardous substances at work, such as chemicals, asbestos, industrial dyes, or chemical fumes. What  are the signs or symptoms? Symptoms of this condition include:  Shortness of breath or difficulty breathing.  A cough with or without mucus (sputum).  Wheezing.  Chest pain or tightness.  A bluish color to the fingernail or toenail beds.  Confusion.  Drowsiness or feeling tired easily, especially with minimal activity. How is this diagnosed? This condition may be diagnosed based on:  Your medical history.  A physical exam.  Other tests, such as: ? Imaging tests. These may include a chest X-ray or CT scan. ? Blood tests, such as an arterial blood gas test. This test is done to check if you have enough oxygen in your blood or if your carbon dioxide levels are too high. ? An electrocardiogram. This test records the electrical activity of your heart. ? An echocardiogram. This test uses sound waves to make an image of your heart. ? Pulmonary function tests. These help to determine if you have chronic lung disease. ? Bronchoscopy. During this test, your health care provider uses a tube with a camera to look inside your airways for signs of inflammation or other problems. How is this treated? Treatment for this condition depends on the cause. Treatment may include:  Breathing in oxygen through a tube with prongs that sit in your nose (nasal cannula) or through a mask that fits over your face.  Receiving noninvasive positive pressure ventilation, called CPAP or BPAP. This is a method of breathing support in which a machine blows air into your lungs through a mask.  Medicines to help with breathing, such as: ? Medicines that open up and relax air passages, such as bronchodilators. These  may be given through a device that turns liquid medicines into a mist you can breathe in (nebulizer). These medicines help with breathing. ? Diuretics. These medicines get rid of extra fluid in your lungs, which can help you breathe better. ? Steroid medicines. These decrease inflammation in the  lungs. ? Antibiotic medicines. These may be given to treat a bacterial infection, such as pneumonia. ? Blood thinners (anticoagulants) to treat blood clots in the lungs.  Pulmonary rehabilitation. This is an exercise program that strengthens the muscles in your chest and helps you learn breathing techniques to manage your condition.  Using a ventilator. This is a breathing machine that delivers oxygen to the lungs through a breathing tube that is put into the trachea. This machine is used when you can no longer breathe well enough on your own. Follow these instructions at home: Medicines  Take over-the-counter and prescription medicines only as told by your health care provider.  If you were prescribed an antibiotic medicine, take it as told by your health care provider. Do not stop taking the antibiotic even if you start to feel better. Lifestyle  Do not use any products that contain nicotine or tobacco, such as cigarettes, e-cigarettes, and chewing tobacco. If you need help quitting, ask your health care provider. Avoid secondhand smoke.  Avoid exposure to irritants that make your breathing problems worse. These include smoke, chemicals, and fumes. General instructions  Use oxygen therapy and do pulmonary rehabilitation if directed by your health care provider. If you require home oxygen therapy, ask your health care provider whether you should purchase a pulse oximeter to measure your oxygen level at home.  If you were given a CPAP machine, make sure that you understand how to use, clean, and care for the machine and that you use it as directed.  Stay active, but balance activity with periods of rest. Exercise and physical activity will help you maintain your ability to do things you want to do.  Stay up to date on all vaccines, especially pneumonia and yearly flu (influenza) vaccines.  Avoid people who are sick and avoid crowded places during the flu season.  Work with your health  care provider to create a plan to help you deal with your condition. Follow this plan.  Keep all follow-up visits as told by your health care provider. This is important. Contact a health care provider if:  Your shortness of breath gets worse and you cannot do the things you used to do.  You have increased sputum, wheezing, coughing, or loss of energy.  You are on oxygen therapy and you are starting to need more.  You need to use your medicines more often.  You have a fever of 100.60F (38C) or higher. Get help right away if:  Your shortness of breath becomes suddenly or significantly worse.  You develop chest pain, tightness, or pressure.  You are unable to say more than a few words without having to catch your breath.  Your lips, toenails, or fingernails are a bluish color.  You become confused or difficult to wake up. These symptoms may represent a serious problem that is an emergency. Do not wait to see if the symptoms will go away. Get medical help right away. Call your local emergency services (911 in the [place] Do not drive yourself to the hospital. Summary  Respiratory failure is a condition in which the lungs do not work well and the breathing system fails.  This condition can be very serious  and is often life-threatening. Chronic respiratory failure has many causes, including COPD, lung infections, and fluid in the lungs. Chronic respiratory failure is lifelong, but its acute symptoms can be very dangerous.  This condition is diagnosed with specific tests and can be treated with medicines, oxygen, or both.  Contact a health care provider if your shortness of breath gets worse, you develop chest pain or fever, or you need to use your oxygen or medicines more often than before. This information is not intended to replace advice given to you by your health care provider. Make sure you discuss any questions you have with your health care provider. Document Revised:  05/13/2019 Document Reviewed: 05/13/2019 [employer] Patient Education  2021 [employer]  Deep Vein Thrombosis  Deep vein thrombosis (DVT) is a condition in which a blood clot forms in a deep vein, such as a vein in the lower leg, thigh, pelvis, or arm. Deep veins are veins in the deep venous system. A clot is blood that has thickened into a gel or solid. This condition is serious and can be life-threatening if the clot travels to the lungs and causes a blockage (pulmonary embolism) in the arteries of the lung. A DVT can also damage veins in the leg. This can lead to long-term, or chronic, venous disease, leg pain, swelling, discoloration, and ulcers or sores (post-thrombotic syndrome). What are the causes? This condition may be caused by:  A slowdown of blood flow.  Damage to a vein.  A condition that causes blood to clot more easily, such as certain blood-clotting disorders. What increases the risk? The following factors may make you more likely to develop this condition:  Having obesity.  Being older, especially older than age 69.  Being inactive (sedentary lifestyle) or not moving around. This may include: ? Sitting or lying down for longer than 4-6 hours other than to sleep at night. ? Being in the hospital, having major or lengthy surgery, or having a thin, flexible tube (central line catheter) placed in a large vein.  Being pregnant, giving birth, or having recently given birth.  Taking medicines that contain estrogen, such as birth control or hormone replacement therapy.  Using products that contain nicotine or tobacco, especially if you use hormonal birth control.  Having a history of blood clots or a blood-clotting disease, a blood vessel disease (peripheral vascular disease), or congestive heart disease.  Having a history of cancer, especially if being treated with chemotherapy. What are the signs or symptoms? Symptoms of this condition include:  Swelling, pain,  pressure, or tenderness in an arm or a leg.  An arm or a leg becoming warm, red, or discolored.  A leg turning very pale. You may have a large DVT. This is rare. If the clot is in your leg, you may notice symptoms more or have worse symptoms when you stand or walk. In some cases, there are no symptoms. How is this diagnosed? This condition is diagnosed with:  Your medical history and a physical exam.  Tests, such as: ? Blood tests to check how well your blood clots. ? Doppler ultrasound. This is the best way to find a DVT. ? Venogram. Contrast dye is injected into a vein, and X-rays are taken to check for clots. How is this treated? Treatment for this condition depends on:  The cause of your DVT.  The size and location of your DVT, or having more than one DVT.  Your risk for bleeding or developing more clots.  Other medical conditions you may have. Treatment may include:  Taking a blood thinner, also called an anticoagulant, to prevent clots from forming and growing.  Wearing compression stockings, if directed.  Injecting medicines into the affected vein to break up the clot (catheter-directed thrombolysis). This is used only for severe DVT and only if a specialist recommends it.  Specific surgical procedures, when DVT is severe or hard to treat. These may be done to: ? Isolate and remove your clot. ? Place an inferior vena cava (IVC) filter in a large vein to catch blood clots before they reach your lungs. You may get some medical treatments for 6 months or longer. Follow these instructions at home: If you are taking blood thinners:  Talk with your health care provider before you take any medicines that contain aspirin or NSAIDs, such as ibuprofen. These medicines increase your risk for dangerous bleeding.  Take your medicine exactly as told, at the same time every day. Do not skip a dose. Do not take more than the prescribed dose. This is important.  Ask your health  care provider about foods and medicines that could change the way your blood thinner works (may interact). Avoid these foods and medicines if you are told to do so.  Avoid anything that may cause bleeding or bruising. You may bleed more easily while taking blood thinners. ? Be very careful when using knives, scissors, or other sharp objects. ? Use an electric razor instead of a blade. ? Avoid activities that could cause injury or bruising, and follow instructions for preventing falls. ? Tell your health care provider if you have had any internal bleeding, bleeding ulcers, or neurologic diseases, such as strokes or cerebral aneurysms.  Wear a medical alert bracelet or carry a card that lists what medicines you take. General instructions  Take over-the-counter and prescription medicines only as told by your health care provider.  Return to your normal activities as told by your health care provider. Ask your health care provider what activities are safe for you.  If recommended, wear compression stockings as told by your health care provider. These stockings help to prevent blood clots and reduce swelling in your legs.  Keep all follow-up visits as told by your health care provider. This is important. Contact a health care provider if:  You miss a dose of your blood thinner.  You have new or worse pain, swelling, or redness in an arm or a leg.  You have worsening numbness or tingling in an arm or a leg.  You have unusual bruising. Get help right away if:  You have signs or symptoms that a blood clot has moved to the lungs. These may include: ? Shortness of breath. ? Chest pain. ? Fast or irregular heartbeats (palpitations). ? Light-headedness or dizziness. ? Coughing up blood.  You have signs or symptoms that your blood is too thin. These may include: ? Blood in your vomit, stool, or urine. ? A cut that will not stop bleeding. ? A menstrual period that is heavier than usual. ? A  severe headache or confusion. These symptoms may represent a serious problem that is an emergency. Do not wait to see if the symptoms will go away. Get medical help right away. Call your local emergency services (911 in the [place] Do not drive yourself to the hospital. Summary  Deep vein thrombosis (DVT) happens when a blood clot forms in a deep vein. This may occur in the lower leg, thigh, pelvis, or  arm.  Symptoms affect the arm or leg and can include swelling, pain, tenderness, warmth, redness, or discoloration.  This condition may be treated with medicines or compression stockings. In severe cases, surgery may be done.  If you are taking blood thinners, take them exactly as told. Do not skip a dose. Do not take more than is prescribed.  Get help right away if you have shortness of breath, chest pain, fast or irregular heartbeats, or blood in your vomit, urine, or stool. This information is not intended to replace advice given to you by your health care provider. Make sure you discuss any questions you have with your health care provider. Document Revised: 04/02/2019 Document Reviewed: 04/02/2019 Elsevier Patient Education  2021 [employer] Information on my medicine - ELIQUIS (apixaban)  Why was Eliquis prescribed for you? Eliquis was prescribed to treat blood clots that may have been found in the veins of your legs (deep vein thrombosis) or in your lungs (pulmonary embolism) and to reduce the risk of them occurring again.  What do You need to know about Eliquis ? The starting dose is 10 mg (two 5 mg tablets) taken TWICE daily for the FIRST SEVEN (7) DAYS, then on 06/19/2020 PM the dose is reduced to ONE 5 mg tablet taken TWICE daily.  Eliquis may be taken with or without food.   Try to take the dose about the same time in the morning and in the evening. If you have difficulty swallowing the tablet whole please discuss with your pharmacist how to take the medication safely.  Take  Eliquis exactly as prescribed and DO NOT stop taking Eliquis without talking to the doctor who prescribed the medication.  Stopping may increase your risk of developing a new blood clot.  Refill your prescription before you run out.  After discharge, you should have regular check-up appointments with your healthcare provider that is prescribing your Eliquis.    What do you do if you miss a dose? If a dose of ELIQUIS is not taken at the scheduled time, take it as soon as possible on the same day and twice-daily administration should be resumed. The dose should not be doubled to make up for a missed dose.  Important Safety Information A possible side effect of Eliquis is bleeding. You should call your healthcare provider right away if you experience any of the following: ? Bleeding from an injury or your nose that does not stop. ? Unusual colored urine (red or dark brown) or unusual colored stools (red or black). ? Unusual bruising for unknown reasons. ? A serious fall or if you hit your head (even if there is no bleeding).  Some medicines may interact with Eliquis and might increase your risk of bleeding or clotting while on Eliquis. To help avoid this, consult your healthcare provider or pharmacist prior to using any new prescription or non-prescription medications, including herbals, vitamins, non-steroidal anti-inflammatory drugs (NSAIDs) and supplements.  This website has more information on Eliquis (apixaban): [email or web address]

## 2020-07-02 NOTE — Discharge Summary (Addendum)
Discharge Summary  Kathleen Cameron YHC:[id number] DOB: 25-Jun-1965  PCP: Horald Pollen, MD  Admit date: 06/03/2020 Discharge date: 07/02/2020  Time spent: 35 minutes  Recommendations for Outpatient Follow-up:  1. Follow-up with pulmonary in 1 to 2 weeks. 2. Follow-up with your primary care provider in 1 to 2 weeks. 3. Follow-up with GI in 1 to 2 weeks. 4. Take your medications as prescribed 5. Continue aspiration and fall precautions. 6. Continue local wound care as recommended by wound care specialist.  [place] Nurse Consult Note, 06/28/2020: Reason for Consult: Consult requested for dry skin to feet and legs. Pt has been critically ill for a prolonged period of time and this phenomenon frequently occurs. Wound type: Legs with generalized dry peeling skin.  Bilat feet with skin peeling off in loose sheets, revealing pink dry intact skin beneath. Dressing procedure/placement/frequency: Topical treatment provided for bedside nurses to perform as follows to promote moist healing: apply Eucerin cream to bilat feet and legs Q day. Please re-consult if further assistance is needed.  Thank-you,  Kathleen Barbie Haggis MSN, RN, CWOCN, CWCN-AP, CNS.   Discharge Diagnoses:  Active Hospital Problems   Diagnosis Date Noted  . Acute on chronic respiratory failure with hypoxia and hypercapnia ([place]) 11/06/2019  . Tracheostomy status ([place])   . Sepsis ([place]) 06/03/2020  . Healthcare-associated pneumonia 06/03/2020  . Pressure injury of skin 06/03/2020  . COPD, severe ([place])   . Anxiety disorder   . Acute respiratory failure ([place])   . Hypothyroidism (acquired) 02/01/2020    Resolved Hospital Problems   Diagnosis Date Noted Date Resolved  . Respiratory failure [facility]) 11/06/2019 06/03/2020    Discharge Condition: Stable.  Diet recommendation: Resume previous diet.  Vitals:   07/02/20 0900 07/02/20 1104  BP: (!) 128/95   Pulse: 94   Resp: 16   Temp:  98.3 F (36.8 C)  SpO2: 100%      History of present illness:  55 year old black female with known severe COPD, chronic hypoxic hypercapnic respiratory failure, chronic anxiety, Seizure disorder HTN Trach 03/02/2020 [facility] and then went home on 06/01/20 Apparently home ventilator malfunctioned = respiratory failure bag mask ventilation  She was admitted with type I-type II respiratory failure secondary to COPD She was found to have left subclavian DVT and placed on Eliquis Because of anemia and positive FOBT, she underwent EGD and colonoscopy = 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  Past Medical History:  Seizure disorder Hypertension Severe COPD Acute on chronic hypoxic and hypercapnic respiratory failure Anxiety  Significant Hospital Events:  Admitted 2/13, treated for hcap/copd exac 2/16 home vent 3/1 foot pain/elbow pain 3/3 podagra  Consults:  FEES 2/25 > without PMSV no aspiration, rec regular texture diet with thin liquids GI  Procedures:  Tracheostomy present prior to admission EGD 3/8  Significant Diagnostic Tests:  2/23 Vas ultrasound upper extre> left subclavian DVT and superficial vein thrombosis 2/25 SLP  Micro Data:  2/13 sars cov 2/flu > negative 2/13 blood > staph hominis 1/4 2/13 resp > OPF 2/16 blood > No growth  Antimicrobials:  Cefepime 2/13 > 2/19 Vanc 2/13 x1  3/1 doxycycline   She has had no reported overt bleeding while on Eliquis.  She is currently afebrile with no leukocytosis.  Vital signs are stable.  O2 saturation 100% on 5 L via mechanical ventilation.  07/02/20: She was seen at bedside.  There were no acute events overnight.  She has no new complaints.  She is eager  to go home.  TOC has been assisting with home health services arrangement.    Hospital Course:  Principal Problem:   Acute on chronic respiratory failure with hypoxia and hypercapnia (HCC) Active Problems:   Hypothyroidism  (acquired)   Acute respiratory failure (HCC)   COPD, severe (HCC)   Anxiety disorder   Sepsis ([place])   Healthcare-associated pneumonia   Pressure injury of skin   Tracheostomy status (HCC)  Chronic hypoxemic and hypercarbic respiratory failure due to COPD S/p tracheostomy, chronic vent- dependent -Continue routine trach and vent care. On her home vent currently. Husband has been previously trained for her discharge care by her previous LTAC. -Speaking valve as tolerated (inline with MV) -Trach clinic follow-up with NP Babcock on 4/27. Can bring results of home Covid test for her appointment.  -Pain control, as needed Percocet for severe pain.  Supplies needed for home tracheostomy and ventilator use: -Home ventilator and backup circuit tubing. Tubing should be changed every 2 weeks. HME should be changed monthly. -Home suction unit with backup battery. Needs tracheal suctioning kits- likely 2-4 per day. -Supply of tracheostomy tubes (Shiley Legacy #6 cuffed); always needs 1 in the next smaller size as well (Shiley #4 cuffed). Change trach and trach tie monthly or sooner if needed.  -Inner cannulas to be changed daily. -Gauze pads for daily changes. -Lubricant packs to assist with tracheostomy placement. -Ambubag with bag valve mask and attachment to tracheostomy tube for assisted ventilation if ventilator fails  Provoked L Subclavian DVT -Con't Eliquis BID; needs 3-6 months of treatment for provoked clot.  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  Chronic anxiety Stable Resume home regimen Follow-up with your primary care provider.   Consultations:  Pulmonary/critical care.  GI  Discharge Exam: BP (!) 128/95   Pulse 94    Temp 98.3 F (36.8 C) (Oral)   Resp 16   Ht $R'5\' 4"'ab$  (1.626 m)   Wt 60.6 kg   SpO2 100%   BMI 22.93 kg/m  . General: 55 y.o. year-old female well developed well nourished in no acute distress.  Alert and oriented x3. . Cardiovascular: Regular rate and rhythm with no rubs or gallops.  No thyromegaly or JVD noted.   [name] Respiratory: Clear to auscultation with no wheezes or rales. Good inspiratory effort. Trach collar in place. . Abdomen: Soft nontender nondistended with normal bowel sounds x4 quadrants. . Musculoskeletal: Trace lower extremity edema. [name] Psychiatry: Mood is appropriate for condition and setting  Discharge Instructions You were cared for by a hospitalist during your hospital stay. If you have any questions about your discharge medications or the care you received while you were in the hospital after you are discharged, you can call the unit and asked to speak with the hospitalist on call if the hospitalist that took care of you is not available. Once you are discharged, your primary care physician will handle any further medical issues. Please note that NO REFILLS for any discharge medications will be authorized once you are discharged, as it is imperative that you return to your primary care physician (or establish a relationship with a primary care physician if you do not have one) for your aftercare needs so that they can reassess your need for medications  and monitor your lab values.   Allergies as of 07/02/2020      Reactions   Stiolto Respimat [tiotropium Bromide-olodaterol] Other (See Comments)   Severe headaches and rapid heartrate      Medication List    STOP taking these medications   enoxaparin 40 MG/0.4ML injection Commonly known as: LOVENOX   fentaNYL 100 MCG/2ML injection Commonly known as: SUBLIMAZE   metoCLOPramide 10 MG tablet Commonly known as: REGLAN   midazolam 2 MG/2ML Soln injection Commonly known as: VERSED     TAKE these medications    amLODipine 5 MG tablet Commonly known as: NORVASC Place 2 tablets (10 mg total) into feeding tube daily.   apixaban 5 MG Tabs tablet Commonly known as: ELIQUIS Place 1 tablet (5 mg total) into feeding tube 2 (two) times daily.   arformoterol 15 MCG/2ML Nebu Commonly known as: BROVANA Take 2 mLs (15 mcg total) by nebulization 2 (two) times daily.   aspirin 81 MG chewable tablet Place 1 tablet (81 mg total) into feeding tube daily.   bisacodyl 10 MG suppository Commonly known as: DULCOLAX Place 1 suppository (10 mg total) rectally daily as needed for severe constipation.   budesonide 0.25 MG/2ML nebulizer solution Commonly known as: PULMICORT Take 2 mLs (0.25 mg total) by nebulization 2 (two) times daily.   chlorhexidine gluconate (MEDLINE KIT) 0.12 % solution Commonly known as: PERIDEX 15 mLs by Mouth Rinse route 2 (two) times daily.   Chlorhexidine Gluconate Cloth 2 % Pads Apply 6 each topically daily.   clonazePAM 1 MG tablet Commonly known as: KLONOPIN Place 1 tablet (1 mg total) into feeding tube 3 (three) times daily.   feeding supplement (OSMOLITE 1.2 CAL) Liqd Place 1,000 mLs into feeding tube continuous.   levothyroxine 50 MCG tablet Commonly known as: SYNTHROID Take 1 tablet (50 mcg total) by mouth daily at 6 (six) AM.   lip balm ointment Apply topically as needed for lip care.   multivitamin with minerals Tabs tablet Place 1 tablet into feeding tube daily. Start taking on: July 03, 2020   oxyCODONE-acetaminophen 5-325 MG tablet Commonly known as: PERCOCET/ROXICET Place 1 tablet into feeding tube every 8 (eight) hours as needed for up to 3 days for severe pain.   pantoprazole sodium 40 mg/20 mL Pack Commonly known as: PROTONIX Place 20 mLs (40 mg total) into feeding tube at bedtime.   phenol 1.4 % Liqd Commonly known as: CHLORASEPTIC Use as directed 1 spray in the mouth or throat as needed for throat irritation / pain.   polyethylene glycol 17 g  packet Commonly known as: MIRALAX / GLYCOLAX Place 17 g into feeding tube daily as needed for mild constipation.   prednisoLONE acetate 1 % ophthalmic suspension Commonly known as: PRED FORTE Place 1 drop into the left eye 4 (four) times daily.   revefenacin 175 MCG/3ML nebulizer solution Commonly known as: YUPELRI Take 3 mLs (175 mcg total) by nebulization daily.   rosuvastatin 40 MG tablet Commonly known as: CRESTOR Place 1 tablet (40 mg total) into feeding tube daily.   senna-docusate 8.6-50 MG tablet Commonly known as: Senokot-S Place 2 tablets into feeding tube at bedtime as needed for moderate constipation.   sodium chloride 0.65 % Soln nasal spray Commonly known as: OCEAN Place 1 spray into both nostrils as needed for congestion.   temazepam 15 MG capsule Commonly known as: RESTORIL Place 1 capsule (15 mg total) into feeding tube at bedtime.   venlafaxine 37.5 MG tablet Commonly known as:  EFFEXOR Place 1 tablet (37.5 mg total) into feeding tube 2 (two) times daily with a meal.            Durable Medical Equipment  (From admission, onward)         Start     Ordered   06/27/20 1413  For home use only DME Tube feeding  Once       Comments: Continue bolus TF regimen via PEG:  Osmolite 1.5 237 (1 carton ) BID between meals  Prosource TF 45 ml BID  Provides 790 kcal and 52 gm protein per day  Free water flushes 80 ml before and after each bolus  MVI with minerals daily via tube  Continue to offer Ensure Enlive po TID, each supplement provides 350 kcal and 20 grams of protein   06/27/20 1413         Allergies  Allergen Reactions  . Stiolto Respimat [Tiotropium Bromide-Olodaterol] Other (See Comments)    Severe headaches and rapid heartrate    Follow-up Information    [facility] Follow up on 07/02/2020.   Specialty: Family Medicine Why: 11 am  with Dr. Ines Bloomer Sagardia Contact information: [place], [place], MD. Call in 1 day(s).   Specialty: Internal Medicine Why: Please call for a post hospital follow-up appointment. Contact information: [place] [address] [phone]        Donato Heinz, MD .   Specialties: Cardiology, Radiology Contact information: [address] [place] [address] [phone]        Juanita Craver, MD. Call in 1 day(s).   Specialty: Gastroenterology Why: Please call for a post hospital follow-up appointment. Contact information: [address] [place] [address] [phone]        Julian Hy, DO. Call in 1 day(s).   Specialty: Pulmonary Disease Why: Please call for a post hospital follow-up appointment. Contact information: [place] [address] [phone]                The results of significant diagnostics from this hospitalization (including imaging, microbiology, ancillary and laboratory) are listed below for reference.    Significant Diagnostic Studies: DG Ankle Complete Right  Result Date: 06/18/2020 CLINICAL DATA:  Pain and tenderness. EXAM: RIGHT ANKLE - COMPLETE 3+ VIEW COMPARISON:  None. FINDINGS: Portable exam limited by oblique positioning on the lateral projection. Within this limitation there is no gross fracture or evidence of dislocation. Soft tissue swelling evident. No worrisome lytic or sclerotic osseous abnormality. IMPRESSION: Limited study due to positioning. Within this limitation, no gross fracture or dislocation. Electronically Signed   By: Misty Stanley M.D.   On: 06/18/2020 08:41   DG CHEST PORT 1 VIEW  Result Date: 06/27/2020 CLINICAL DATA:  Shortness of breath EXAM: PORTABLE CHEST 1 VIEW COMPARISON:  06/23/20 FINDINGS: Cardiac shadow is stable. The lungs are hyperinflated stable from the prior exam. Patchy airspace opacity is noted in the left mid lung slightly  improved from the prior study. Mild interstitial changes are again identified and stable. No new focal abnormality is seen. IMPRESSION: Slight improvement in focal airspace opacity in the left mid lung. Electronically Signed   By: Inez Catalina M.D.   On: 06/27/2020 22:42   DG CHEST PORT 1 VIEW  Result Date: 06/23/2020 CLINICAL DATA:  Respiratory failure EXAM: PORTABLE CHEST 1 VIEW  COMPARISON:  March 13, 2020 in June 15, 2019 FINDINGS: The tracheostomy tube is in good position. No pneumothorax. Nodularity in the periphery of the right upper lobe is stable. Hyperinflation of the lungs. The cardiomediastinal silhouette is stable. Increased interstitial markings on the right. More pronounced opacity in the left lung diffusely, most prominent in the left perihilar region. There is a focal opacity projected over the left hilum. No other acute abnormalities. IMPRESSION: 1. Tracheostomy tube is above. 2. New focal opacity in the left hilar region, suggestive of developing infiltrate/pneumonia. More diffuse opacity throughout the left mid lung may represent part of this infectious process. Recommend attention on follow-up. 3. Mild diffuse increased interstitial opacities on the right may represent asymmetric edema versus atypical infection. 4. No other changes. Electronically Signed   By: Dorise Bullion III M.D   On: 06/23/2020 07:06   DG CHEST PORT 1 VIEW  Result Date: 06/14/2020 CLINICAL DATA:  Chest pain EXAM: PORTABLE CHEST 1 VIEW COMPARISON:  06/10/2020 FINDINGS: Blunting of the costophrenic angles likely reflecting persistent trace pleural effusions. Improved left lung aeration with persistent patchy density primarily at the lung base. No pneumothorax. Stable cardiomediastinal contours. Tracheostomy device is present. IMPRESSION: Improved left lung aeration with persistent patchy atelectasis/consolidation at the base. Similar trace pleural effusions. Electronically Signed   By: Macy Mis M.D.   On:  06/14/2020 13:35   DG Chest Port 1 View  Result Date: 06/10/2020 CLINICAL DATA:  Acute on chronic respiratory failure. EXAM: PORTABLE CHEST 1 VIEW COMPARISON:  06/08/2020 FINDINGS: A tracheostomy tube is again noted. Cardiomediastinal silhouette is unchanged. Emphysema changes are again noted. Interstitial opacities within the LEFT lung and LEFT basilar atelectasis again noted. There is no evidence of pneumothorax. IMPRESSION: Little significant change from the prior study with LEFT lung interstitial opacities and LEFT basilar atelectasis. Electronically Signed   By: Margarette Canada M.D.   On: 06/10/2020 08:25   DG Chest Port 1 View  Result Date: 06/08/2020 CLINICAL DATA:  Respiratory failure EXAM: PORTABLE CHEST 1 VIEW COMPARISON:  06/03/2020 FINDINGS: Tracheostomy cannula in appropriate position. Lungs are hyperexpanded. Mild strandy opacities at the left lung base favored to be atelectasis. Pleural based right upper lobe lung nodule is again seen. IMPRESSION: Mild left basilar atelectasis. Interval improvement in aeration of the perihilar opacities of the lungs. Right upper lobe pulmonary nodule is again seen nonemergent noncontrast chest CT should be performed to confirm stability of pulmonary nodules. Electronically Signed   By: Miachel Roux M.D.   On: 06/08/2020 08:31   DG Chest Port 1 View  Result Date: 06/03/2020 CLINICAL DATA:  Worsening shortness of breath. Recent hospital discharge ester day. EXAM: PORTABLE CHEST 1 VIEW COMPARISON:  Radiograph 05/28/2020. FINDINGS: Tracheostomy tube only partially included in the field of view, tip at the thoracic inlet. The lungs are hyperinflated. Stable heart size and mediastinal contours. Worsening left perihilar airspace disease with increasing patchy opacities and bronchial thickening. Increasing peribronchial thickening throughout the right lung. No pneumothorax. No significant pleural effusion. Costophrenic angle blunting is due to hyperinflation and  chronic. Prior granulomatous disease with calcified mediastinal nodes. IMPRESSION: 1. Worsening left perihilar airspace disease with increasing patchy opacities and bronchial thickening, suspicious for worsening pneumonia. 2. Increasing peribronchial thickening throughout the right lung. Electronically Signed   By: Keith Rake M.D.   On: 06/03/2020 00:52   DG Knee Right Port  Result Date: 06/18/2020 CLINICAL DATA:  Tenderness and pain EXAM: PORTABLE RIGHT KNEE - 1-2 VIEW COMPARISON:  None.  FINDINGS: No joint effusion. There is no fracture or dislocation. No radio-opaque foreign body or soft tissue calcification. IMPRESSION: Negative. Electronically Signed   By: Kerby Moors M.D.   On: 06/18/2020 08:42   DG Foot Complete Right  Result Date: 06/18/2020 CLINICAL DATA:  Pain and tenderness. EXAM: RIGHT FOOT COMPLETE - 3+ VIEW COMPARISON:  None. FINDINGS: There is no evidence of fracture or dislocation. There is no evidence of arthropathy or other focal bone abnormality. Soft tissues are unremarkable. IMPRESSION: Negative. Electronically Signed   By: Misty Stanley M.D.   On: 06/18/2020 08:42   VAS [place] UPPER EXTREMITY VENOUS DUPLEX  Result Date: 06/12/2020 UPPER VENOUS STUDY  Indications: Edema Anticoagulation: Lovenox. Comparison Study: No prior studies. Performing Technologist: Darlin Coco RDMS  Examination Guidelines: A complete evaluation includes B-mode imaging, spectral Doppler, color Doppler, and power Doppler as needed of all accessible portions of each vessel. Bilateral testing is considered an integral part of a complete examination. Limited examinations for reoccurring indications may be performed as noted.  Right Findings: +----------+------------+---------+-----------+----------+-------+ RIGHT     CompressiblePhasicitySpontaneousPropertiesSummary +----------+------------+---------+-----------+----------+-------+ Subclavian    Full       Yes       Yes                       +----------+------------+---------+-----------+----------+-------+  Left Findings: +----------+------------+---------+-----------+----------+---------------------+ LEFT      CompressiblePhasicitySpontaneousProperties       Summary        +----------+------------+---------+-----------+----------+---------------------+ Subclavian               Yes       Yes               Smal, focal area of                                                        age indeterminate                                                             thrombus        +----------+------------+---------+-----------+----------+---------------------+ Axillary      Full       Yes       Yes                                    +----------+------------+---------+-----------+----------+---------------------+ Brachial      Full                                                        +----------+------------+---------+-----------+----------+---------------------+ Radial        Full                                                        +----------+------------+---------+-----------+----------+---------------------+  Ulnar         Full                                                        +----------+------------+---------+-----------+----------+---------------------+ Cephalic    Partial      Yes       Yes                Age Indeterminate   +----------+------------+---------+-----------+----------+---------------------+ Basilic       Full       Yes       Yes                                    +----------+------------+---------+-----------+----------+---------------------+  Summary:  Right: No evidence of thrombosis in the subclavian.  Left: Findings consistent with a small, focal age indeterminate deep vein thrombus involving the left subclavian vein. Findings consistent with age indeterminate superficial vein thrombosis involving the left cephalic vein at the forearm.  *See table(s) above for  measurements and observations.  Diagnosing physician: Deitra Mayo MD Electronically signed by Deitra Mayo MD on 06/12/2020 at 6:27:00 PM.    Final     Microbiology: No results found for this or any previous visit (from the past 240 hour(s)).   Labs: Basic Metabolic Panel: Recent Labs  Lab 06/27/20 1611 06/28/20 0134 06/29/20 0155 06/30/20 0111 07/01/20 0634  NA 142 142 138 142 138  K 3.7 4.1 3.9 3.9 4.0  CL 101 101 99 101 99  CO2 31 34* 35* 34* 34*  GLUCOSE 100* 133* 124* 104* 84  BUN _0 CREATININE 0.38* 0.37* 0.42* 0.41* 0.42*  CALCIUM 9.4 9.1 9.1 9.1 9.2  MG 1.9 1.9  --   --   --    Liver Function Tests: Recent Labs  Lab 06/29/20 0155 06/30/20 0111 07/01/20 0634  AST 33 37 42*  ALT 35 37 41  ALKPHOS 70 74 75  BILITOT 0.3 0.7 0.6  PROT 5.7* 6.1* 6.3*  ALBUMIN 2.0* 2.1* 2.2*   No results for input(s): LIPASE, AMYLASE in the last 168 hours. No results for input(s): AMMONIA in the last 168 hours. CBC: Recent Labs  Lab 06/27/20 0111 06/28/20 0134 06/29/20 0155 06/30/20 0111 07/01/20 0634  WBC 6.9 6.2 7.2 9.0 9.5  NEUTROABS  --   --  4.5 6.0 6.4  HGB 8.1* 8.0* 7.3* 8.0* 7.9*  HCT 25.3* 25.6* 24.0* 25.8* 25.2*  MCV 90.7 92.4 92.0 92.1 91.0  PLT 244 239 241 278 293   Cardiac Enzymes: No results for input(s): CKTOTAL, CKMB, CKMBINDEX, TROPONINI in the last 168 hours. BNP: BNP (last 3 results) Recent Labs    09/06/19 1414 01/28/20 1503 02/25/20 1115  BNP 32.2 106.8* 282.7*    ProBNP (last 3 results) No results for input(s): PROBNP in the last 8760 hours.  CBG: Recent Labs  Lab 07/01/20 2306 07/02/20 0310 07/02/20 0405 07/02/20 0718 07/02/20 1102  GLUCAP 111* 67* 79 93 125*       Signed:  Kayleen Memos, MD [facility] 07/02/2020, 11:07 AM

## 2020-07-03 ENCOUNTER — Emergency Department (HOSPITAL_COMMUNITY): Payer: Medicaid Other

## 2020-07-03 ENCOUNTER — Inpatient Hospital Stay (HOSPITAL_COMMUNITY)
Admission: EM | Admit: 2020-07-03 | Discharge: 2020-07-09 | DRG: 207 | Disposition: A | Discharge status: Home / Self Care | Payer: Medicaid Other | Attending: Internal Medicine | Admitting: Internal Medicine

## 2020-07-03 ENCOUNTER — Encounter (HOSPITAL_COMMUNITY): Payer: Self-pay | Admitting: Emergency Medicine

## 2020-07-03 ENCOUNTER — Other Ambulatory Visit: Payer: Self-pay

## 2020-07-03 ENCOUNTER — Inpatient Hospital Stay (HOSPITAL_COMMUNITY): Payer: Medicaid Other

## 2020-07-03 DIAGNOSIS — T17990A Other foreign object in respiratory tract, part unspecified in causing asphyxiation, initial encounter: Secondary | ICD-10-CM | POA: Diagnosis present

## 2020-07-03 DIAGNOSIS — Z931 Gastrostomy status: Secondary | ICD-10-CM

## 2020-07-03 DIAGNOSIS — D638 Anemia in other chronic diseases classified elsewhere: Secondary | ICD-10-CM | POA: Diagnosis present

## 2020-07-03 DIAGNOSIS — J189 Pneumonia, unspecified organism: Secondary | ICD-10-CM

## 2020-07-03 DIAGNOSIS — Z7951 Long term (current) use of inhaled steroids: Secondary | ICD-10-CM

## 2020-07-03 DIAGNOSIS — Z79899 Other long term (current) drug therapy: Secondary | ICD-10-CM | POA: Diagnosis not present

## 2020-07-03 DIAGNOSIS — Z9911 Dependence on respirator [ventilator] status: Secondary | ICD-10-CM | POA: Diagnosis not present

## 2020-07-03 DIAGNOSIS — R739 Hyperglycemia, unspecified: Secondary | ICD-10-CM | POA: Diagnosis present

## 2020-07-03 DIAGNOSIS — I10 Essential (primary) hypertension: Secondary | ICD-10-CM | POA: Diagnosis present

## 2020-07-03 DIAGNOSIS — J9621 Acute and chronic respiratory failure with hypoxia: Secondary | ICD-10-CM | POA: Diagnosis present

## 2020-07-03 DIAGNOSIS — J9601 Acute respiratory failure with hypoxia: Secondary | ICD-10-CM

## 2020-07-03 DIAGNOSIS — F32A Depression, unspecified: Secondary | ICD-10-CM | POA: Diagnosis present

## 2020-07-03 DIAGNOSIS — M109 Gout, unspecified: Secondary | ICD-10-CM | POA: Diagnosis present

## 2020-07-03 DIAGNOSIS — Z20822 Contact with and (suspected) exposure to covid-19: Secondary | ICD-10-CM | POA: Diagnosis present

## 2020-07-03 DIAGNOSIS — Z7901 Long term (current) use of anticoagulants: Secondary | ICD-10-CM | POA: Diagnosis not present

## 2020-07-03 DIAGNOSIS — I82722 Chronic embolism and thrombosis of deep veins of left upper extremity: Secondary | ICD-10-CM | POA: Diagnosis present

## 2020-07-03 DIAGNOSIS — Z23 Encounter for immunization: Secondary | ICD-10-CM | POA: Diagnosis not present

## 2020-07-03 DIAGNOSIS — E039 Hypothyroidism, unspecified: Secondary | ICD-10-CM | POA: Diagnosis present

## 2020-07-03 DIAGNOSIS — J449 Chronic obstructive pulmonary disease, unspecified: Secondary | ICD-10-CM | POA: Diagnosis present

## 2020-07-03 DIAGNOSIS — K31819 Angiodysplasia of stomach and duodenum without bleeding: Secondary | ICD-10-CM | POA: Diagnosis present

## 2020-07-03 DIAGNOSIS — Z7982 Long term (current) use of aspirin: Secondary | ICD-10-CM

## 2020-07-03 DIAGNOSIS — G40909 Epilepsy, unspecified, not intractable, without status epilepticus: Secondary | ICD-10-CM | POA: Diagnosis present

## 2020-07-03 DIAGNOSIS — G9349 Other encephalopathy: Secondary | ICD-10-CM | POA: Diagnosis present

## 2020-07-03 DIAGNOSIS — E872 Acidosis: Secondary | ICD-10-CM | POA: Diagnosis present

## 2020-07-03 DIAGNOSIS — E876 Hypokalemia: Secondary | ICD-10-CM | POA: Diagnosis not present

## 2020-07-03 DIAGNOSIS — Z888 Allergy status to other drugs, medicaments and biological substances status: Secondary | ICD-10-CM | POA: Diagnosis not present

## 2020-07-03 DIAGNOSIS — F419 Anxiety disorder, unspecified: Secondary | ICD-10-CM | POA: Diagnosis present

## 2020-07-03 DIAGNOSIS — J9622 Acute and chronic respiratory failure with hypercapnia: Secondary | ICD-10-CM | POA: Diagnosis present

## 2020-07-03 DIAGNOSIS — Z79890 Hormone replacement therapy (postmenopausal): Secondary | ICD-10-CM | POA: Diagnosis not present

## 2020-07-03 DIAGNOSIS — Z930 Tracheostomy status: Secondary | ICD-10-CM | POA: Diagnosis not present

## 2020-07-03 DIAGNOSIS — L89151 Pressure ulcer of sacral region, stage 1: Secondary | ICD-10-CM | POA: Diagnosis present

## 2020-07-03 DIAGNOSIS — L8990 Pressure ulcer of unspecified site, unspecified stage: Secondary | ICD-10-CM | POA: Diagnosis present

## 2020-07-03 LAB — CBC WITH DIFFERENTIAL/PLATELET
Abs Immature Granulocytes: 0 10*3/uL (ref 0.00–0.07)
Basophils Absolute: 0 10*3/uL (ref 0.0–0.1)
Basophils Relative: 0 %
Eosinophils Absolute: 1.2 10*3/uL — ABNORMAL HIGH (ref 0.0–0.5)
Eosinophils Relative: 4 %
HCT: 29.7 % — ABNORMAL LOW (ref 36.0–46.0)
Hemoglobin: 8.9 g/dL — ABNORMAL LOW (ref 12.0–15.0)
Lymphocytes Relative: 4 %
Lymphs Abs: 1.2 10*3/uL (ref 0.7–4.0)
MCH: 28.2 pg (ref 26.0–34.0)
MCHC: 30 g/dL (ref 30.0–36.0)
MCV: 94 fL (ref 80.0–100.0)
Monocytes Absolute: 1.2 10*3/uL — ABNORMAL HIGH (ref 0.1–1.0)
Monocytes Relative: 4 %
Neutro Abs: 25.6 10*3/uL — ABNORMAL HIGH (ref 1.7–7.7)
Neutrophils Relative %: 88 %
Platelets: 507 10*3/uL — ABNORMAL HIGH (ref 150–400)
RBC: 3.16 MIL/uL — ABNORMAL LOW (ref 3.87–5.11)
RDW: 17.1 % — ABNORMAL HIGH (ref 11.5–15.5)
WBC: 29.1 10*3/uL — ABNORMAL HIGH (ref 4.0–10.5)
nRBC: 0 % (ref 0.0–0.2)
nRBC: 0 /100 WBC

## 2020-07-03 LAB — BASIC METABOLIC PANEL
Anion gap: 9 (ref 5–15)
BUN: 17 mg/dL (ref 6–20)
CO2: 31 mmol/L (ref 22–32)
Calcium: 10.2 mg/dL (ref 8.9–10.3)
Chloride: 98 mmol/L (ref 98–111)
Creatinine, Ser: 0.67 mg/dL (ref 0.44–1.00)
GFR, Estimated: >60 mL/min (ref >60)
Glucose, Bld: 152 mg/dL — ABNORMAL HIGH (ref 70–99)
Potassium: 4.1 mmol/L (ref 3.5–5.1)
Sodium: 138 mmol/L (ref 135–145)

## 2020-07-03 LAB — I-STAT ARTERIAL BLOOD GAS, ED
Acid-Base Excess: 9 mmol/L — ABNORMAL HIGH (ref 0.0–2.0)
Bicarbonate: 37.8 mmol/L — ABNORMAL HIGH (ref 20.0–28.0)
Calcium, Ion: 1.32 mmol/L (ref 1.15–1.40)
HCT: 27 % — ABNORMAL LOW (ref 36.0–46.0)
Hemoglobin: 9.2 g/dL — ABNORMAL LOW (ref 12.0–15.0)
O2 Saturation: 100 %
Patient temperature: 97.7
Potassium: 3.8 mmol/L (ref 3.5–5.1)
Sodium: 136 mmol/L (ref 135–145)
TCO2: 40 mmol/L — ABNORMAL HIGH (ref 22–32)
pCO2 arterial: 78.2 mmHg (ref 32.0–48.0)
pH, Arterial: 7.289 — ABNORMAL LOW (ref 7.350–7.450)
pO2, Arterial: 330 mmHg — ABNORMAL HIGH (ref 83.0–108.0)

## 2020-07-03 LAB — POCT I-STAT 7, (LYTES, BLD GAS, ICA,H+H)
Acid-Base Excess: 7 mmol/L — ABNORMAL HIGH (ref 0.0–2.0)
Acid-Base Excess: 8 mmol/L — ABNORMAL HIGH (ref 0.0–2.0)
Bicarbonate: 35.1 mmol/L — ABNORMAL HIGH (ref 20.0–28.0)
Bicarbonate: 34.3 mmol/L — ABNORMAL HIGH (ref 20.0–28.0)
Calcium, Ion: 1.34 mmol/L (ref 1.15–1.40)
Calcium, Ion: 1.29 mmol/L (ref 1.15–1.40)
HCT: 29 % — ABNORMAL LOW (ref 36.0–46.0)
HCT: 27 % — ABNORMAL LOW (ref 36.0–46.0)
Hemoglobin: 9.9 g/dL — ABNORMAL LOW (ref 12.0–15.0)
Hemoglobin: 9.2 g/dL — ABNORMAL LOW (ref 12.0–15.0)
O2 Saturation: 94 %
O2 Saturation: 99 %
Patient temperature: 98.6
Patient temperature: 98
Potassium: 4.1 mmol/L (ref 3.5–5.1)
Potassium: 4.1 mmol/L (ref 3.5–5.1)
Sodium: 139 mmol/L (ref 135–145)
Sodium: 139 mmol/L (ref 135–145)
TCO2: 37 mmol/L — ABNORMAL HIGH (ref 22–32)
TCO2: 36 mmol/L — ABNORMAL HIGH (ref 22–32)
pCO2 arterial: 73.4 mmHg (ref 32.0–48.0)
pCO2 arterial: 56.2 mmHg — ABNORMAL HIGH (ref 32.0–48.0)
pH, Arterial: 7.287 — ABNORMAL LOW (ref 7.350–7.450)
pH, Arterial: 7.391 (ref 7.350–7.450)
pO2, Arterial: 81 mmHg — ABNORMAL LOW (ref 83.0–108.0)
pO2, Arterial: 132 mmHg — ABNORMAL HIGH (ref 83.0–108.0)

## 2020-07-03 LAB — GLUCOSE, CAPILLARY
Glucose-Capillary: 121 mg/dL — ABNORMAL HIGH (ref 70–99)
Glucose-Capillary: 93 mg/dL (ref 70–99)

## 2020-07-03 LAB — RESP PANEL BY RT-PCR (FLU A&B, COVID) ARPGX2
Influenza A by PCR: NEGATIVE
Influenza B by PCR: NEGATIVE
SARS Coronavirus 2 by RT PCR: NEGATIVE

## 2020-07-03 LAB — MRSA PCR SCREENING: MRSA by PCR: NEGATIVE

## 2020-07-03 LAB — LACTIC ACID, PLASMA: Lactic Acid, Venous: 0.9 mmol/L (ref 0.5–1.9)

## 2020-07-03 MED ORDER — LIP MEDEX EX OINT: TOPICAL_OINTMENT | CUTANEOUS | Status: DC | PRN

## 2020-07-03 MED ORDER — ROSUVASTATIN CALCIUM 20 MG PO TABS
40.0000 mg | ORAL_TABLET | Freq: Every day | ORAL | Status: DC
  Administered 2020-07-04 – 2020-07-09 (×6): 40 mg
  Filled 2020-07-03 (×6): qty 2

## 2020-07-03 MED ORDER — ORAL CARE MOUTH RINSE
15.0000 mL | OROMUCOSAL | Status: DC
  Administered 2020-07-04 – 2020-07-09 (×45): 15 mL via OROMUCOSAL

## 2020-07-03 MED ORDER — CLONAZEPAM 1 MG PO TABS
1.0000 mg | ORAL_TABLET | Freq: Three times a day (TID) | ORAL | Status: DC
  Administered 2020-07-03 – 2020-07-09 (×17): 1 mg
  Filled 2020-07-03 (×17): qty 1

## 2020-07-03 MED ORDER — PREDNISOLONE ACETATE 1 % OP SUSP
1.0000 [drp] | Freq: Four times a day (QID) | OPHTHALMIC | Status: DC
  Filled 2020-07-03: qty 5

## 2020-07-03 MED ORDER — ARFORMOTEROL TARTRATE 15 MCG/2ML IN NEBU
15.0000 ug | INHALATION_SOLUTION | Freq: Two times a day (BID) | RESPIRATORY_TRACT | Status: DC
  Administered 2020-07-04 – 2020-07-09 (×11): 15 ug via RESPIRATORY_TRACT
  Filled 2020-07-03 (×13): qty 2

## 2020-07-03 MED ORDER — APIXABAN 5 MG PO TABS
5.0000 mg | ORAL_TABLET | Freq: Two times a day (BID) | ORAL | Status: DC
  Administered 2020-07-03 – 2020-07-09 (×12): 5 mg
  Filled 2020-07-03 (×12): qty 1

## 2020-07-03 MED ORDER — BUDESONIDE 0.25 MG/2ML IN SUSP
0.2500 mg | Freq: Two times a day (BID) | RESPIRATORY_TRACT | Status: DC
  Administered 2020-07-03 – 2020-07-09 (×12): 0.25 mg via RESPIRATORY_TRACT
  Filled 2020-07-03 (×12): qty 2

## 2020-07-03 MED ORDER — INSULIN ASPART 100 UNIT/ML ~~LOC~~ SOLN
2.0000 [IU] | SUBCUTANEOUS | Status: DC
  Administered 2020-07-05 (×3): 2 [IU] via SUBCUTANEOUS

## 2020-07-03 MED ORDER — CHLORHEXIDINE GLUCONATE 0.12% ORAL RINSE (MEDLINE KIT)
15.0000 mL | Freq: Two times a day (BID) | OROMUCOSAL | Status: DC
  Administered 2020-07-03 – 2020-07-09 (×11): 15 mL via OROMUCOSAL

## 2020-07-03 MED ORDER — OXYCODONE-ACETAMINOPHEN 5-325 MG PO TABS
1.0000 | ORAL_TABLET | Freq: Three times a day (TID) | ORAL | Status: DC | PRN
Start: 2020-07-03 — End: 2020-07-09
  Administered 2020-07-04: 1
  Filled 2020-07-03: qty 1

## 2020-07-03 MED ORDER — SALINE SPRAY 0.65 % NA SOLN: 1.0000 | NASAL | Status: DC | PRN

## 2020-07-03 MED ORDER — VENLAFAXINE HCL 37.5 MG PO TABS
37.5000 mg | ORAL_TABLET | Freq: Two times a day (BID) | ORAL | Status: DC
  Administered 2020-07-04 – 2020-07-09 (×10): 37.5 mg
  Filled 2020-07-03 (×13): qty 1

## 2020-07-03 MED ORDER — TEMAZEPAM 15 MG PO CAPS
15.0000 mg | ORAL_CAPSULE | Freq: Every day | ORAL | Status: DC
  Administered 2020-07-03 – 2020-07-08 (×6): 15 mg
  Filled 2020-07-03 (×6): qty 1

## 2020-07-03 MED ORDER — SENNOSIDES-DOCUSATE SODIUM 8.6-50 MG PO TABS
2.0000 | ORAL_TABLET | Freq: Every evening | ORAL | Status: DC | PRN
  Administered 2020-07-05: 2
  Filled 2020-07-03: qty 2

## 2020-07-03 MED ORDER — SODIUM CHLORIDE 0.9 % IV SOLN
2.0000 g | Freq: Once | INTRAVENOUS | Status: AC
  Administered 2020-07-03: 2 g via INTRAVENOUS
  Filled 2020-07-03: qty 2

## 2020-07-03 MED ORDER — FENTANYL CITRATE (PF) 100 MCG/2ML IJ SOLN
12.5000 ug | INTRAMUSCULAR | Status: DC | PRN
  Administered 2020-07-03 – 2020-07-04 (×2): 25 ug via INTRAVENOUS
  Filled 2020-07-03 (×2): qty 2

## 2020-07-03 MED ORDER — DOCUSATE SODIUM 100 MG PO CAPS
100.0000 mg | ORAL_CAPSULE | Freq: Two times a day (BID) | ORAL | Status: DC | PRN
Start: 2020-07-03 — End: 2020-07-07

## 2020-07-03 MED ORDER — POLYETHYLENE GLYCOL 3350 17 G PO PACK: 17.0000 g | PACK | Freq: Every day | ORAL | Status: DC | PRN

## 2020-07-03 MED ORDER — PANTOPRAZOLE SODIUM 40 MG PO PACK
40.0000 mg | PACK | Freq: Every day | ORAL | Status: DC
  Administered 2020-07-03 – 2020-07-08 (×6): 40 mg
  Filled 2020-07-03 (×7): qty 20

## 2020-07-03 MED ORDER — ASPIRIN 81 MG PO CHEW
81.0000 mg | CHEWABLE_TABLET | Freq: Every day | ORAL | Status: DC
  Administered 2020-07-04 – 2020-07-05 (×2): 81 mg
  Filled 2020-07-03 (×2): qty 1

## 2020-07-03 MED ORDER — ADULT MULTIVITAMIN W/MINERALS CH
1.0000 | ORAL_TABLET | Freq: Every day | ORAL | Status: DC
  Administered 2020-07-04 – 2020-07-05 (×2): 1
  Filled 2020-07-03 (×2): qty 1

## 2020-07-03 MED ORDER — VANCOMYCIN HCL 750 MG/150ML IV SOLN
750.0000 mg | Freq: Two times a day (BID) | INTRAVENOUS | Status: DC
  Administered 2020-07-04: 750 mg via INTRAVENOUS
  Filled 2020-07-03: qty 150

## 2020-07-03 MED ORDER — PHENOL 1.4 % MT LIQD: 1.0000 | OROMUCOSAL | Status: DC | PRN

## 2020-07-03 MED ORDER — SIMETHICONE 40 MG/0.6ML PO SUSP
40.0000 mg | Freq: Four times a day (QID) | ORAL | Status: AC | PRN
  Administered 2020-07-03 – 2020-07-04 (×3): 40 mg
  Filled 2020-07-03 (×4): qty 0.6

## 2020-07-03 MED ORDER — LEVOTHYROXINE SODIUM 25 MCG PO TABS
50.0000 ug | ORAL_TABLET | Freq: Every day | ORAL | Status: DC
  Administered 2020-07-04 – 2020-07-07 (×4): 50 ug via ORAL
  Filled 2020-07-03 (×4): qty 2

## 2020-07-03 MED ORDER — REVEFENACIN 175 MCG/3ML IN SOLN
175.0000 ug | Freq: Every day | RESPIRATORY_TRACT | Status: DC
  Administered 2020-07-04 – 2020-07-09 (×6): 175 ug via RESPIRATORY_TRACT
  Filled 2020-07-03 (×6): qty 3

## 2020-07-03 MED ORDER — AMLODIPINE BESYLATE 10 MG PO TABS
10.0000 mg | ORAL_TABLET | Freq: Every day | ORAL | Status: DC
  Administered 2020-07-04: 10 mg
  Filled 2020-07-03 (×2): qty 1

## 2020-07-03 MED ORDER — SODIUM CHLORIDE 0.9 % IV SOLN
2.0000 g | Freq: Three times a day (TID) | INTRAVENOUS | Status: DC
  Administered 2020-07-04 (×2): 2 g via INTRAVENOUS
  Filled 2020-07-03 (×2): qty 2

## 2020-07-03 MED ORDER — BISACODYL 10 MG RE SUPP: 10.0000 mg | Freq: Every day | RECTAL | Status: DC | PRN

## 2020-07-03 MED ORDER — CHLORHEXIDINE GLUCONATE CLOTH 2 % EX PADS
6.0000 | MEDICATED_PAD | Freq: Every day | CUTANEOUS | Status: DC
  Administered 2020-07-04 – 2020-07-09 (×7): 6 via TOPICAL

## 2020-07-03 MED ORDER — VANCOMYCIN HCL 1250 MG/250ML IV SOLN
1250.0000 mg | Freq: Once | INTRAVENOUS | Status: AC
  Administered 2020-07-03: 1250 mg via INTRAVENOUS
  Filled 2020-07-03: qty 250

## 2020-07-03 NOTE — Progress Notes (Addendum)
Pharmacy Antibiotic Note  Kathleen Cameron is a 55 y.o. female admitted on 07/03/2020 with pneumonia.  Pharmacy has been consulted for vancomycin dosing.  WBC 29.1, SCr 0.67  Plan: -Vancomycin 1250 mg IV load followed by Vancomycin 750 mg IV Q 12 hrs. Goal AUC 400-550. Expected AUC: 505 SCr used: 0.7 -Monitor CBC, renal fx, cultures and clinical progress -Vanc levels as indicated      Temp (24hrs), Avg:97.7 F (36.5 C), Min:97.7 F (36.5 C), Max:97.7 F (36.5 C)  Recent Labs  Lab 06/28/20 0134 06/29/20 0155 06/30/20 0111 07/01/20 0634 07/03/20 1453  WBC 6.2 7.2 9.0 9.5 29.1*  CREATININE 0.37* 0.42* 0.41* 0.42* 0.67    Estimated Creatinine Clearance: 67.8 mL/min (by C-G formula based on SCr of 0.67 mg/dL).    Allergies  Allergen Reactions  . Stiolto Respimat [Tiotropium Bromide-Olodaterol] Other (See Comments)    Severe headaches and rapid heartrate    Antimicrobials this admission: Cefepime 3/15 >>  Vanc 3/15 >>   Dose adjustments this admission:   Microbiology results: 3/15 BCx:  3/15 Sputum  Thank you for allowing pharmacy to be a part of this patient's care.  Vinnie Level, PharmD., BCPS, BCCCP Clinical Pharmacist Please refer to [facility] for unit-specific pharmacist   Addendum: Now adding cefepime for resistant gram negative coverage. Start Cefepime 2 gm IV Q 8 hours.   Vinnie Level, PharmD., BCPS, BCCCP Clinical Pharmacist Please refer to [facility] for unit-specific pharmacist

## 2020-07-03 NOTE — Progress Notes (Signed)
eLink Physician-Brief Progress Note Patient Name: Kathleen Cameron DOB: 1966-03-08 MRN: [id number]   Date of Service  07/03/2020  HPI/Events of Note  Patient with respiratory acidosis, and 3 quadrant abdominal pain without guarding or rebound, abdomen is firm on observed bedside RN palpation of the abdomen. No wheezing on chest ausculation by RT.  eICU Interventions  Respiratory rate increased to 25 with repeat ABG in 45 minutes, CT abdomen  / pelvis ordered, PRN iv Fentanyl ordered for pain.        [name] Dorr Perrot 07/03/2020, 8:10 PM

## 2020-07-03 NOTE — Progress Notes (Signed)
Patient transported to CT scan and back to 57M-03 with no events to report.

## 2020-07-03 NOTE — Progress Notes (Signed)
eLink Physician-Brief Progress Note Patient Name: Kathleen Cameron DOB: [birth year] MRN: [id number]   Date of Service  07/03/2020  HPI/Events of Note  No acute findings on abdominal CT, mild gastric distention.  eICU Interventions  Mylicon ordered PRN, okay to use G-tube for medications, etc.        [name] Ji Fairburn 07/03/2020, 10:02 PM

## 2020-07-03 NOTE — Progress Notes (Signed)
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  eICU Interventions  New Patient Evaluation completed.        Kathleen Cameron [name] 07/03/2020, 7:58 PM

## 2020-07-03 NOTE — ED Provider Notes (Signed)
MSE was initiated and I personally evaluated the patient and placed orders (if any) at  2:53 PM on July 03, 2020.  The patient appears stable so that the remainder of the MSE may be completed by another provider.  55 year old female trach and chronic vent at home.  EMS was called by husband when he could not wake her.  FD had her initial set of 50% unresponsive.  When the paramedics got there they bagged her through her trach with improvement in mental status and pulse ox.  Currently she is awake and alert.  Still having increased work of breathing.  Diminished breath sounds bilaterally.  Respiratory here and giving her saline flushes and suctioning.   Terrilee Files, MD 07/03/20 [phone]

## 2020-07-03 NOTE — ED Triage Notes (Signed)
Patient coming from home. EMS called for unresponsive, SpO2 50% with fire. EMS bagged pt for a few minutes, O2 saturation rose, pt more alert following.

## 2020-07-03 NOTE — Progress Notes (Signed)
Patient transported from ED RESUS to 2M03 without complication.

## 2020-07-03 NOTE — ED Notes (Signed)
1st lactic normal. 2nd to be d/c.

## 2020-07-03 NOTE — H&P (Addendum)
NAME:  Kathleen Cameron, MRN:  [id number], DOB:  [birth year], LOS: 0 ADMISSION DATE:  07/03/2020, CONSULTATION DATE:  3/15 REFERRING MD:  Dr. Roslynn Amble EDP, CHIEF COMPLAINT:  Respiratory failure   Brief History:  55 year old female on home vent since 02/2020 with end stage COPD. Recently admitted after home vent failed found to have pneumonia. Course complicated by DVT, GI bleeding, and new diagnosis of gout. Discharged to home 3/14. Developed profound hypoxia in the setting of likely mucous plug on 3/15 prompting EMS transfer to ED. Sats 50% on their arrival.   History of Present Illness:  55 year old female with PMH as below, which is significant for chronic hypoxemic respiratory failure on home vent via trach placed November of 2021 after COPD exacerbation and failure to liberate from the vent. She was treated in [facility] and ultimately discharged 2/12 to home with her husband as her primary caretaker. She was recently admitted to [facility] 2/13 after home ventilator complication. At that time she was diagnosed with pneumonia and COPD exacerbation, improving with the usual therapies. 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 She was discharged to home on 3/14.  Husband describes no issues after arriving home that night and into the following morning on 3/15. However, later that day she became dyspneic and asked for suctioning. Husband and home health nurse attempted to suction without benefit. She continued to desaturate. Lurline Idol was changed. No plug identified and she did not improve. When she became less responsive EMS was alerted. Upon their arrival her O2 sats were in the 57s. Sats and mental status improved with BVM ventilations. She was transported to ED for further evaluation where she was treated with empiric antibiotics. PCCM asked to evaluate.   Past Medical History:   has a past medical history of Acute on chronic  respiratory failure with hypoxia ([place]), Anxiety disorder, Asthma, COPD (chronic obstructive pulmonary disease) ([place]), COPD, severe ([place]), Hypertension, and Seizure disorder ([place]).  Significant Hospital Events:  3/15 admit  Consults:    Procedures:  Trach 6 Shiley cuffed (present on admission) > PEG (present on admission) >  Significant Diagnostic Tests:    Micro Data:  Blood 3/15 > Sputum 3/15 >  Antimicrobials:  Cefepime 3/15 > Vanco 3/15 >  Interim History / Subjective:    Objective   Blood pressure (!) 141/97, pulse 95, temperature 97.7 F (36.5 C), temperature source Axillary, resp. rate (!) 29, SpO2 100 %.    FiO2 (%):  [98 %-100 %] 98 % Set Rate:  [16 bmp] 16 bmp Vt Set:  [480 mL] 480 mL PEEP:  [5 cmH20] 5 cmH20 Plateau Pressure:  [27 cmH20] 27 cmH20  No intake or output data in the 24 hours ending 07/03/20 1638 There were no vitals filed for this visit.  Examination: General: Frail, chronically ill appearing female on vent.  HENT: [place]/AT, PERRL, no JVD. Trach in place.  Lungs: Bilaterally coarse. No distress on PRVC. Pink tinged secretions in tracheal suction tubing.  Cardiovascular: RRR, no MRG Abdomen: Soft, non-tender, non-distended Extremities: Left upper extremity edema and erythema distal to known subclavian DVT Neuro: Alert and interactive. Non-focal  Resolved Hospital Problem list     Assessment & Plan:   Acute on chronic hypercarbic and hypoxemic respiratory failure: likely suffered mucous plug at home less than 24 hours after discharge, which family and home health staff were unable to remedy. Ventilator seemed to be working fine  and was alarming for high pressure. Cannot rule out HCAP, although this seems unlikely she was discharged only yesterday.  - Admit to ICU for vent optimization - Chest PT - Empiric cefepime/vanco. Low threshold to DC.  - Follow cultures - Dispo to home is almost certain to result in more episodes like this. Likely need  to pursue vent/SNF. Patient and husband are very reluctant.   COPD without acute exacerbation - continue home budesonide/brovana/yulperi   Left subclavian DVT - continue eliquis  Encephalopathy, chronic: related to extended ICU stays and associated delirium.  Depression - continue home venlafaxine, temazepam, clonazepam  Hypertension - continue home norvasc  Hypothyroid - continue home synthroid  Nutrition - tube feeds - Regular diet (eats on vent but needs TF supplemental)  ? Gout: R great toe. - colchicine held at time of discharge.  Monitor  Best practice (evaluated daily)  Diet: TF, regular diet Pain/Anxiety/Delirium protocol (if indicated): NA VAP protocol (if indicated): per protocol DVT prophylaxis: Eliquis GI prophylaxis: PPI Glucose control: NA Mobility: Bedrest Disposition:ICU  Goals of Care:  Last date of multidisciplinary goals of care discussion:3/15 Family and staff present:  Summary of discussion:  Follow up goals of care discussion due: 3/22 Code Status: Full  Labs   CBC: Recent Labs  Lab 06/28/20 0134 06/29/20 0155 06/30/20 0111 07/01/20 0634 07/03/20 1453  WBC 6.2 7.2 9.0 9.5 29.1*  NEUTROABS  --  4.5 6.0 6.4 25.6*  HGB 8.0* 7.3* 8.0* 7.9* 8.9*  HCT 25.6* 24.0* 25.8* 25.2* 29.7*  MCV 92.4 92.0 92.1 91.0 94.0  PLT 239 241 278 293 507*    Basic Metabolic Panel: Recent Labs  Lab 06/27/20 1611 06/28/20 0134 06/29/20 0155 06/30/20 0111 07/01/20 0634 07/03/20 1453  NA 142 142 138 142 138 138  K 3.7 4.1 3.9 3.9 4.0 4.1  CL 101 101 99 101 99 98  CO2 31 34* 35* 34* 34* 31  GLUCOSE 100* 133* 124* 104* 84 152*  BUN $Re'17 17 17 16 17 17  'Sjw$ CREATININE 0.38* 0.37* 0.42* 0.41* 0.42* 0.67  CALCIUM 9.4 9.1 9.1 9.1 9.2 10.2  MG 1.9 1.9  --   --   --   --    GFR: Estimated Creatinine Clearance: 67.8 mL/min (by C-G formula based on SCr of 0.67 mg/dL). Recent Labs  Lab 06/29/20 0155 06/30/20 0111 07/01/20 0634 07/03/20 1453  WBC 7.2 9.0  9.5 29.1*    Liver Function Tests: Recent Labs  Lab 06/29/20 0155 06/30/20 0111 07/01/20 0634  AST 33 37 42*  ALT 35 37 41  ALKPHOS 70 74 75  BILITOT 0.3 0.7 0.6  PROT 5.7* 6.1* 6.3*  ALBUMIN 2.0* 2.1* 2.2*   No results for input(s): LIPASE, AMYLASE in the last 168 hours. No results for input(s): AMMONIA in the last 168 hours.  ABG    Component Value Date/Time   PHART 7.379 06/27/2020 2247   PCO2ART 58.4 (H) 06/27/2020 2247   PO2ART 104 06/27/2020 2247   HCO3 33.8 (H) 06/27/2020 2247   TCO2 35 (H) 06/03/2020 0052   O2SAT 98.1 06/27/2020 2247     Coagulation Profile: No results for input(s): INR, PROTIME in the last 168 hours.  Cardiac Enzymes: No results for input(s): CKTOTAL, CKMB, CKMBINDEX, TROPONINI in the last 168 hours.  HbA1C: Hgb A1c MFr Bld  Date/Time Value Ref Range Status  06/21/2020 02:24 AM 5.3 4.8 - 5.6 % Final    Comment:    (NOTE) Pre diabetes:  5.7%-6.4%  Diabetes:              >6.4%  Glycemic control for   <7.0% adults with diabetes   02/26/2020 05:18 PM 5.4 4.8 - 5.6 % Final    Comment:    (NOTE) Pre diabetes:          5.7%-6.4%  Diabetes:              >6.4%  Glycemic control for   <7.0% adults with diabetes     CBG: Recent Labs  Lab 07/01/20 2306 07/02/20 0310 07/02/20 0405 07/02/20 0718 07/02/20 1102  GLUCAP 111* 67* 79 93 125*    Review of Systems:   Bolds are positive  Constitutional: weight loss, gain, night sweats, Fevers, chills, fatigue .  HEENT: headaches, Sore throat, sneezing, nasal congestion, post nasal drip, Difficulty swallowing, Tooth/dental problems, visual complaints visual changes, ear ache CV:  chest pain, radiates:,Orthopnea, PND, swelling in lower extremities, dizziness, palpitations, syncope.  GI  heartburn, indigestion, abdominal pain, nausea, vomiting, diarrhea, change in bowel habits, loss of appetite, bloody stools.  Resp: cough, productive: , hemoptysis, dyspnea, chest pain, pleuritic.   Skin: rash or itching or icterus GU: dysuria, change in color of urine, urgency or frequency. flank pain, hematuria  MS: joint pain or swelling. decreased range of motion  Psych: change in mood or affect. depression or anxiety.  Neuro: difficulty with speech, weakness, numbness, ataxia    Past Medical History:  She,  has a past medical history of Acute on chronic respiratory failure with hypoxia ([place]), Anxiety disorder, Asthma, COPD (chronic obstructive pulmonary disease) ([place]), COPD, severe ([place]), Hypertension, and Seizure disorder ([place]).   Surgical History:   Past Surgical History:  Procedure Laterality Date  . COLONOSCOPY N/A 06/26/2020   Procedure: COLONOSCOPY;  Surgeon: Carol Ada, MD;  Location: [place];  Service: Endoscopy;  Laterality: N/A;  . ENTEROSCOPY N/A 06/26/2020   Procedure: ENTEROSCOPY;  Surgeon: Carol Ada, MD;  Location: [place];  Service: Endoscopy;  Laterality: N/A;  . HEMOSTASIS CLIP PLACEMENT  06/26/2020   Procedure: HEMOSTASIS CLIP PLACEMENT;  Surgeon: Carol Ada, MD;  Location: [place];  Service: Endoscopy;;  . HOT HEMOSTASIS N/A 06/26/2020   Procedure: HOT HEMOSTASIS (ARGON PLASMA COAGULATION/BICAP);  Surgeon: Carol Ada, MD;  Location: [place];  Service: Endoscopy;  Laterality: N/A;  . IR FLUORO RM 30-60 MIN  03/23/2020  . LAPAROSCOPIC INSERTION GASTROSTOMY TUBE N/A 04/24/2020   Procedure: LAPAROSCOPIC GASTROSTOMY TUBE PLACEMENT;  Surgeon: Kieth Brightly Arta Bruce, MD;  Location: [place];  Service: General;  Laterality: N/A;  . POLYPECTOMY  06/26/2020   Procedure: POLYPECTOMY;  Surgeon: Carol Ada, MD;  Location: [place];  Service: Endoscopy;;  . WISDOM TOOTH EXTRACTION       Social History:   reports that she has never smoked. She has never used smokeless tobacco. She reports current alcohol use of about 2.0 standard drinks of alcohol per week. She reports that she does not use drugs.   Family History:  Her Family history is  unknown by patient.   Allergies Allergies  Allergen Reactions  . Stiolto Respimat [Tiotropium Bromide-Olodaterol] Other (See Comments)    Severe headaches and rapid heartrate     Home Medications  Prior to Admission medications   Medication Sig Start Date End Date Taking? Authorizing Provider  amLODipine (NORVASC) 5 MG tablet Place 2 tablets (10 mg total) into feeding tube daily. 03/09/20   Candee Furbish, MD  apixaban (ELIQUIS) 5 MG TABS tablet Place 1  tablet (5 mg total) into feeding tube 2 (two) times daily. 07/02/20 10/30/20  Kayleen Memos, DO  arformoterol (BROVANA) 15 MCG/2ML NEBU Take 2 mLs (15 mcg total) by nebulization 2 (two) times daily. 03/08/20   Candee Furbish, MD  aspirin 81 MG chewable tablet Place 1 tablet (81 mg total) into feeding tube daily. 03/09/20   Candee Furbish, MD  bisacodyl (DULCOLAX) 10 MG suppository Place 1 suppository (10 mg total) rectally daily as needed for severe constipation. 03/08/20   Candee Furbish, MD  budesonide (PULMICORT) 0.25 MG/2ML nebulizer solution Take 2 mLs (0.25 mg total) by nebulization 2 (two) times daily. 03/08/20   Candee Furbish, MD  Chlorhexidine Gluconate Cloth 2 % PADS Apply 6 each topically daily. 03/09/20   Candee Furbish, MD  chlorhexidine gluconate, MEDLINE KIT, (PERIDEX) 0.12 % solution 15 mLs by Mouth Rinse route 2 (two) times daily. 03/08/20   Candee Furbish, MD  clonazePAM (KLONOPIN) 1 MG tablet Place 1 tablet (1 mg total) into feeding tube 3 (three) times daily. 03/08/20   Candee Furbish, MD  levothyroxine (SYNTHROID) 50 MCG tablet Take 1 tablet (50 mcg total) by mouth daily at 6 (six) AM. 02/03/20   Charlynne Cousins, MD  lip balm (CARMEX) ointment Apply topically as needed for lip care. 03/08/20   Candee Furbish, MD  Multiple Vitamin (MULTIVITAMIN WITH MINERALS) TABS tablet Place 1 tablet into feeding tube daily. 07/03/20 10/01/20  Kayleen Memos, DO  Nutritional Supplements (FEEDING SUPPLEMENT, OSMOLITE 1.2 CAL,)  LIQD Place 1,000 mLs into feeding tube continuous. 03/08/20   Candee Furbish, MD  oxyCODONE-acetaminophen (PERCOCET/ROXICET) 5-325 MG tablet Place 1 tablet into feeding tube every 8 (eight) hours as needed for up to 3 days for severe pain. 07/02/20 07/05/20  Kayleen Memos, DO  pantoprazole sodium (PROTONIX) 40 mg/20 mL PACK Place 20 mLs (40 mg total) into feeding tube at bedtime. 03/08/20   Candee Furbish, MD  phenol (CHLORASEPTIC) 1.4 % LIQD Use as directed 1 spray in the mouth or throat as needed for throat irritation / pain. 03/08/20   Candee Furbish, MD  polyethylene glycol (MIRALAX / GLYCOLAX) 17 g packet Place 17 g into feeding tube daily as needed for mild constipation. 03/08/20   Candee Furbish, MD  prednisoLONE acetate (PRED FORTE) 1 % ophthalmic suspension Place 1 drop into the left eye 4 (four) times daily. 03/08/20   Candee Furbish, MD  revefenacin (YUPELRI) 175 MCG/3ML nebulizer solution Take 3 mLs (175 mcg total) by nebulization daily. 03/09/20   Candee Furbish, MD  rosuvastatin (CRESTOR) 40 MG tablet Place 1 tablet (40 mg total) into feeding tube daily. 03/08/20   Candee Furbish, MD  senna-docusate (SENOKOT-S) 8.6-50 MG tablet Place 2 tablets into feeding tube at bedtime as needed for moderate constipation. 03/08/20   Candee Furbish, MD  sodium chloride (OCEAN) 0.65 % SOLN nasal spray Place 1 spray into both nostrils as needed for congestion. 03/08/20   Candee Furbish, MD  temazepam (RESTORIL) 15 MG capsule Place 1 capsule (15 mg total) into feeding tube at bedtime. 03/08/20   Candee Furbish, MD  venlafaxine [facility]) 37.5 MG tablet Place 1 tablet (37.5 mg total) into feeding tube 2 (two) times daily with a meal. 03/08/20   Candee Furbish, MD     Critical care time: 40 minutes      Georgann Housekeeper, AGACNP-BC [facility]  See Amion  for personal pager PCCM on call pager [phone] until 7pm. Please call Elink 7p-7a. [phone]  07/03/2020 5:38  PM

## 2020-07-04 ENCOUNTER — Inpatient Hospital Stay (HOSPITAL_COMMUNITY): Payer: Medicaid Other

## 2020-07-04 DIAGNOSIS — R5381 Other malaise: Secondary | ICD-10-CM

## 2020-07-04 DIAGNOSIS — R140 Abdominal distension (gaseous): Secondary | ICD-10-CM

## 2020-07-04 LAB — EXPECTORATED SPUTUM ASSESSMENT W GRAM STAIN, RFLX TO RESP C

## 2020-07-04 LAB — CBC
HCT: 25.9 % — ABNORMAL LOW (ref 36.0–46.0)
Hemoglobin: 7.9 g/dL — ABNORMAL LOW (ref 12.0–15.0)
MCH: 27.6 pg (ref 26.0–34.0)
MCHC: 30.5 g/dL (ref 30.0–36.0)
MCV: 90.6 fL (ref 80.0–100.0)
Platelets: 378 10*3/uL (ref 150–400)
RBC: 2.86 MIL/uL — ABNORMAL LOW (ref 3.87–5.11)
RDW: 16.6 % — ABNORMAL HIGH (ref 11.5–15.5)
WBC: 12.1 10*3/uL — ABNORMAL HIGH (ref 4.0–10.5)
nRBC: 0 % (ref 0.0–0.2)

## 2020-07-04 LAB — BASIC METABOLIC PANEL
Anion gap: 12 (ref 5–15)
BUN: 24 mg/dL — ABNORMAL HIGH (ref 6–20)
CO2: 28 mmol/L (ref 22–32)
Calcium: 9.3 mg/dL (ref 8.9–10.3)
Chloride: 97 mmol/L — ABNORMAL LOW (ref 98–111)
Creatinine, Ser: 0.68 mg/dL (ref 0.44–1.00)
GFR, Estimated: >60 mL/min (ref >60)
Glucose, Bld: 80 mg/dL (ref 70–99)
Potassium: 4 mmol/L (ref 3.5–5.1)
Sodium: 137 mmol/L (ref 135–145)

## 2020-07-04 LAB — GLUCOSE, CAPILLARY
Glucose-Capillary: 94 mg/dL (ref 70–99)
Glucose-Capillary: 80 mg/dL (ref 70–99)
Glucose-Capillary: 67 mg/dL — ABNORMAL LOW (ref 70–99)
Glucose-Capillary: 108 mg/dL — ABNORMAL HIGH (ref 70–99)
Glucose-Capillary: 116 mg/dL — ABNORMAL HIGH (ref 70–99)
Glucose-Capillary: 114 mg/dL — ABNORMAL HIGH (ref 70–99)
Glucose-Capillary: 114 mg/dL — ABNORMAL HIGH (ref 70–99)

## 2020-07-04 LAB — MAGNESIUM: Magnesium: 1.9 mg/dL (ref 1.7–2.4)

## 2020-07-04 LAB — PHOSPHORUS: Phosphorus: 5.4 mg/dL — ABNORMAL HIGH (ref 2.5–4.6)

## 2020-07-04 MED ORDER — FREE WATER: 100.0000 mL | Freq: Three times a day (TID) | Status: DC

## 2020-07-04 MED ORDER — DEXTROSE 50 % IV SOLN: 25.0000 mL | Freq: Once | INTRAVENOUS | Status: DC

## 2020-07-04 MED ORDER — OSMOLITE 1.5 CAL PO LIQD: 237.0000 mL | Freq: Three times a day (TID) | ORAL | Status: DC

## 2020-07-04 MED ORDER — PROSOURCE TF PO LIQD: 45.0000 mL | Freq: Two times a day (BID) | ORAL | Status: DC

## 2020-07-04 MED ORDER — ONDANSETRON HCL 4 MG/2ML IJ SOLN: 4.0000 mg | Freq: Once | INTRAMUSCULAR | Status: DC

## 2020-07-04 MED ORDER — ONDANSETRON HCL 4 MG/2ML IJ SOLN
4.0000 mg | Freq: Three times a day (TID) | INTRAMUSCULAR | Status: DC | PRN
  Administered 2020-07-04: 4 mg via INTRAVENOUS
  Filled 2020-07-04: qty 2

## 2020-07-04 NOTE — Progress Notes (Signed)
RTx2 placed patient on home trilogy vent per MD without complications. Family at bedside. RT verified vent settings, 5L O2 bleed in and equipment with family. RT plugged vent power cord into red outlet. Patient is tolerating being on home trilogy well at this time. RN aware. RT will continue to monitor.

## 2020-07-04 NOTE — Progress Notes (Addendum)
Initial Nutrition Assessment  DOCUMENTATION CODES:   Not applicable  INTERVENTION:    Ensure Enlive po TID, each supplement provides 350 kcal and 20 grams of protein.   Continue regular diet.  TF via PEG:  Osmolite 1.5 237 ml (1 carton) TID  Prosource TF 45 ml BID  Provides 1145 kcal, 67 gm protein, 543 ml free water daily. Meets 75% of estimated kcal and protein needs.   Free water flushes: 50 ml before and after each bolus tube feeding.    MVI with minerals 1 tablet via tube daily.  NUTRITION DIAGNOSIS:   Inadequate oral intake related to chronic illness (COPD on chronic vent support) as evidenced by per patient/family report.  GOAL:   Patient will meet greater than or equal to 90% of their needs  MONITOR:   PO intake,Supplement acceptance,TF tolerance,Skin,Labs  REASON FOR ASSESSMENT:   Consult Enteral/tube feeding initiation and management  ASSESSMENT:   55 yo female admitted with profound hypoxia likely r/t mucous plug. PMH includes ESCOPD on home vent, trach, PEG, anxiety, asthma, HTN, seizure disorder, recent GIB and PNA, gout.   Patient familiar from previous admission, just discharged 3/14. She was diagnosed with moderate malnutrition, which is likely ongoing. Unable to obtain enough information at this time for identification of malnutrition.  Last admission, she was receiving bolus TF via PEG: Osmolite 1.5 237 ml (1 carton) BID, in addition to taking POs. She was drinking 2-3 Ensure Enlive supplements daily during last admission. Unsure of her intake at home since recent discharge home.    Patient is on chronic ventilator support via trach MV: 11.4 L/minTemp (24hrs), Avg:97.6 F (36.4 C), Min:97.5 F (36.4 C), Max:97.7 F (36.5 C)   Labs reviewed. Phos 5.4 CBG: 94-80-67  Medications reviewed and include Novolog SSI, MVI with minerals, Protonix.  Usual weights reviewed. She weighed 60.6 kg on discharge 3/14. Currently 58.1 kg Over the past  6 months, weight has trended up from 48.4 kg on 01/02/20.  NUTRITION - FOCUSED PHYSICAL EXAM:  unable to complete  Diet Order:   Diet Order            Diet regular Room service appropriate? Yes; Fluid consistency: Thin  Diet effective now                 EDUCATION NEEDS:   Not appropriate for education at this time  Skin:  Skin Integrity Issues:: Stage I,Stage II Stage I: sacrum Stage II: R buttocks  Last BM:  no BM documented  Height:   Ht Readings from Last 1 Encounters:  06/25/20 5\' 4"  (1.626 m)    Weight:   Wt Readings from Last 1 Encounters:  07/04/20 58.1 kg    BMI:  Body mass index is 21.99 kg/m.  Estimated Nutritional Needs:   Kcal:  1550-1750  Protein:  80-90 gm  Fluid:  >/= 1.5 L    07/06/20, RD, LDN, CNSC Please refer to Amion for contact information.

## 2020-07-04 NOTE — ED Provider Notes (Signed)
[facility] 72M MEDICAL ICU Provider Note   CSN: [id number] Arrival date & time: 07/03/20  1444     History Chief Complaint  Patient presents with  . Altered Mental Status  . Shortness of Breath    Kathleen Cameron is a 55 y.o. female.  Presented to ER with concern for respiratory failure, altered mental status.  History limited due to patient's vent/trach dependence, acuity.  Per report, husband called EMS after patient went unresponsive, vent was alarming.  Home health person had tried changing the trach and suctioning and had copious secretions.  Paramedics report that when they started bagging patient through the trach, had improvement in mental status and pulse ox.  Patient continues to improve after being placed on our ventilator.  Discharged yesterday.  Additional history obtained from review of chart, discharge summary.  HPI     Past Medical History:  Diagnosis Date  . Acute on chronic respiratory failure with hypoxia (HCC)   . Anxiety disorder   . Asthma   . COPD (chronic obstructive pulmonary disease) (HCC)   . COPD, severe (HCC)   . Hypertension   . Seizure disorder [facility])     Patient Active Problem List   Diagnosis Date Noted  . Acute on chronic respiratory failure with hypercapnia (HCC) 07/03/2020  . Tracheostomy status (HCC)   . Sepsis (HCC) 06/03/2020  . Healthcare-associated pneumonia 06/03/2020  . Pressure injury of skin 06/03/2020  . Acute respiratory failure (HCC)   . Seizure disorder (HCC)   . COPD, severe (HCC)   . Anxiety disorder   . Generalized abdominal pain   . Convulsions (HCC)   . Malnutrition of moderate degree 02/27/2020  . Hypotension   . Acute hypercapnic respiratory failure (HCC) 02/25/2020  . Right upper quadrant abdominal pain 02/01/2020  . Hypothyroidism (acquired) 02/01/2020  . Acute metabolic encephalopathy 01/29/2020  . Hyponatremia 01/28/2020  . Anxiety 12/02/2019  . COPD exacerbation (HCC) 11/06/2019   . Depression 11/06/2019  . Acute on chronic respiratory failure with hypoxia and hypercapnia (HCC) 11/06/2019  . Current moderate episode of major depressive disorder without prior episode (HCC) 09/20/2019  . Pedal edema 08/04/2019  . Chronic respiratory failure with hypoxia (HCC) 03/07/2019  . CKD (chronic kidney disease) 01/05/2019  . COPD (chronic obstructive pulmonary disease) (HCC) 09/29/2018  . Pulmonary nodules 09/27/2018  . Dyslipidemia 07/07/2018  . Essential hypertension 03/10/2018    Past Surgical History:  Procedure Laterality Date  . COLONOSCOPY N/A 06/26/2020   Procedure: COLONOSCOPY;  Surgeon: Jeani Hawking, MD;  Location: [facility] ENDOSCOPY;  Service: Endoscopy;  Laterality: N/A;  . ENTEROSCOPY N/A 06/26/2020   Procedure: ENTEROSCOPY;  Surgeon: Jeani Hawking, MD;  Location: [facility] ENDOSCOPY;  Service: Endoscopy;  Laterality: N/A;  . HEMOSTASIS CLIP PLACEMENT  06/26/2020   Procedure: HEMOSTASIS CLIP PLACEMENT;  Surgeon: Jeani Hawking, MD;  Location: [facility] ENDOSCOPY;  Service: Endoscopy;;  . HOT HEMOSTASIS N/A 06/26/2020   Procedure: HOT HEMOSTASIS (ARGON PLASMA COAGULATION/BICAP);  Surgeon: Jeani Hawking, MD;  Location: [facility] ENDOSCOPY;  Service: Endoscopy;  Laterality: N/A;  . IR FLUORO RM 30-60 MIN  03/23/2020  . LAPAROSCOPIC INSERTION GASTROSTOMY TUBE N/A 04/24/2020   Procedure: LAPAROSCOPIC GASTROSTOMY TUBE PLACEMENT;  Surgeon: Sheliah Hatch De Blanch, MD;  Location: [facility] OR;  Service: General;  Laterality: N/A;  . POLYPECTOMY  06/26/2020   Procedure: POLYPECTOMY;  Surgeon: Jeani Hawking, MD;  Location: [facility] ENDOSCOPY;  Service: Endoscopy;;  . WISDOM TOOTH EXTRACTION       OB History   No obstetric  history on file.     Family History  Family history unknown: Yes    Social History   Tobacco Use  . Smoking status: Never Smoker  . Smokeless tobacco: Never Used  Vaping Use  . Vaping Use: Never used  Substance Use Topics  . Alcohol use: Yes    Alcohol/week: 2.0 standard drinks    Types:  2 Cans of beer per week  . Drug use: Never    Home Medications Prior to Admission medications   Medication Sig Start Date End Date Taking? Authorizing Provider  amLODipine (NORVASC) 5 MG tablet Place 2 tablets (10 mg total) into feeding tube daily. 03/09/20  Yes Candee Furbish, MD  apixaban (ELIQUIS) 5 MG TABS tablet Place 1 tablet (5 mg total) into feeding tube 2 (two) times daily. 07/02/20 10/30/20 Yes Hall, Carole N, DO  arformoterol (BROVANA) 15 MCG/2ML NEBU Take 2 mLs (15 mcg total) by nebulization 2 (two) times daily. 03/08/20  Yes Candee Furbish, MD  aspirin 81 MG chewable tablet Place 1 tablet (81 mg total) into feeding tube daily. 03/09/20  Yes Candee Furbish, MD  bisacodyl (DULCOLAX) 10 MG suppository Place 1 suppository (10 mg total) rectally daily as needed for severe constipation. 03/08/20  Yes Candee Furbish, MD  budesonide (PULMICORT) 0.25 MG/2ML nebulizer solution Take 2 mLs (0.25 mg total) by nebulization 2 (two) times daily. 03/08/20  Yes Candee Furbish, MD  Chlorhexidine Gluconate Cloth 2 % PADS Apply 6 each topically daily. 03/09/20  Yes Candee Furbish, MD  chlorhexidine gluconate, MEDLINE KIT, (PERIDEX) 0.12 % solution 15 mLs by Mouth Rinse route 2 (two) times daily. 03/08/20  Yes Candee Furbish, MD  clonazePAM (KLONOPIN) 1 MG tablet Place 1 tablet (1 mg total) into feeding tube 3 (three) times daily. 03/08/20  Yes Candee Furbish, MD  levothyroxine (SYNTHROID) 50 MCG tablet Take 1 tablet (50 mcg total) by mouth daily at 6 (six) AM. 02/03/20  Yes Charlynne Cousins, MD  lip balm (CARMEX) ointment Apply topically as needed for lip care. 03/08/20  Yes Candee Furbish, MD  Multiple Vitamin (MULTIVITAMIN WITH MINERALS) TABS tablet Place 1 tablet into feeding tube daily. 07/03/20 10/01/20 Yes Kayleen Memos, DO  Nutritional Supplements (FEEDING SUPPLEMENT, OSMOLITE 1.2 CAL,) LIQD Place 1,000 mLs into feeding tube continuous. 03/08/20  Yes Candee Furbish, MD   oxyCODONE-acetaminophen (PERCOCET/ROXICET) 5-325 MG tablet Place 1 tablet into feeding tube every 8 (eight) hours as needed for up to 3 days for severe pain. 07/02/20 07/05/20 Yes Hall, Carole N, DO  pantoprazole sodium (PROTONIX) 40 mg/20 mL PACK Place 20 mLs (40 mg total) into feeding tube at bedtime. 03/08/20  Yes Candee Furbish, MD  phenol (CHLORASEPTIC) 1.4 % LIQD Use as directed 1 spray in the mouth or throat as needed for throat irritation / pain. 03/08/20  Yes Candee Furbish, MD  polyethylene glycol (MIRALAX / GLYCOLAX) 17 g packet Place 17 g into feeding tube daily as needed for mild constipation. 03/08/20  Yes Candee Furbish, MD  prednisoLONE acetate (PRED FORTE) 1 % ophthalmic suspension Place 1 drop into the left eye 4 (four) times daily. 03/08/20  Yes Candee Furbish, MD  revefenacin [facility]) 175 MCG/3ML nebulizer solution Take 3 mLs (175 mcg total) by nebulization daily. 03/09/20  Yes Candee Furbish, MD  rosuvastatin (CRESTOR) 40 MG tablet Place 1 tablet (40 mg total) into feeding tube daily. 03/08/20  Yes Candee Furbish, MD  senna-docusate (  SENOKOT-S) 8.6-50 MG tablet Place 2 tablets into feeding tube at bedtime as needed for moderate constipation. 03/08/20  Yes Candee Furbish, MD  sodium chloride (OCEAN) 0.65 % SOLN nasal spray Place 1 spray into both nostrils as needed for congestion. 03/08/20  Yes Candee Furbish, MD  temazepam (RESTORIL) 15 MG capsule Place 1 capsule (15 mg total) into feeding tube at bedtime. 03/08/20  Yes Candee Furbish, MD  venlafaxine [facility]) 37.5 MG tablet Place 1 tablet (37.5 mg total) into feeding tube 2 (two) times daily with a meal. 03/08/20  Yes Candee Furbish, MD    Allergies    Stiolto respimat [tiotropium bromide-olodaterol]  Review of Systems   Review of Systems  Unable to perform ROS: Patient nonverbal    Physical Exam Updated Vital Signs BP (!) 78/66   Pulse 88   Temp (!) 97.5 F (36.4 C) (Axillary)   Resp (!) 25   Wt 57.6 kg    SpO2 99%   BMI 21.80 kg/m   Physical Exam Vitals and nursing note reviewed.  Constitutional:      Comments: Chronically ill-appearing vent/trach dependent  HENT:     Head: Normocephalic.     Mouth/Throat:     Comments: Trach appears intact Eyes:     Pupils: Pupils are equal, round, and reactive to light.  Cardiovascular:     Rate and Rhythm: Normal rate.     Pulses: Normal pulses.  Pulmonary:     Comments: Breath sounds equal bilaterally Chest:     Chest wall: No tenderness or crepitus.  Abdominal:     Palpations: Abdomen is soft.     Comments: Soft nontender  Musculoskeletal:     Cervical back: Normal range of motion.     Right lower leg: No edema.     Left lower leg: No edema.  Skin:    General: Skin is warm.  Neurological:     General: No focal deficit present.     Comments: Alert, following commands     ED Results / Procedures / Treatments   Labs (all labs ordered are listed, but only abnormal results are displayed) Labs Reviewed  CBC WITH DIFFERENTIAL/PLATELET - Abnormal; Notable for the following components:      Result Value   WBC 29.1 (*)    RBC 3.16 (*)    Hemoglobin 8.9 (*)    HCT 29.7 (*)    RDW 17.1 (*)    Platelets 507 (*)    Neutro Abs 25.6 (*)    Monocytes Absolute 1.2 (*)    Eosinophils Absolute 1.2 (*)    All other components within normal limits  BASIC METABOLIC PANEL - Abnormal; Notable for the following components:   Glucose, Bld 152 (*)    All other components within normal limits  GLUCOSE, CAPILLARY - Abnormal; Notable for the following components:   Glucose-Capillary 121 (*)    All other components within normal limits  I-STAT ARTERIAL BLOOD GAS, ED - Abnormal; Notable for the following components:   pH, Arterial 7.289 (*)    pCO2 arterial 78.2 (*)    pO2, Arterial 330 (*)    Bicarbonate 37.8 (*)    TCO2 40 (*)    Acid-Base Excess 9.0 (*)    HCT 27.0 (*)    Hemoglobin 9.2 (*)    All other components within normal limits   POCT I-STAT 7, (LYTES, BLD GAS, ICA,H+H) - Abnormal; Notable for the following components:   pH, Arterial 7.287 (*)  pCO2 arterial 73.4 (*)    pO2, Arterial 81 (*)    Bicarbonate 35.1 (*)    TCO2 37 (*)    Acid-Base Excess 7.0 (*)    HCT 29.0 (*)    Hemoglobin 9.9 (*)    All other components within normal limits  POCT I-STAT 7, (LYTES, BLD GAS, ICA,H+H) - Abnormal; Notable for the following components:   pCO2 arterial 56.2 (*)    pO2, Arterial 132 (*)    Bicarbonate 34.3 (*)    TCO2 36 (*)    Acid-Base Excess 8.0 (*)    HCT 27.0 (*)    Hemoglobin 9.2 (*)    All other components within normal limits  RESP PANEL BY RT-PCR (FLU A&B, COVID) ARPGX2  MRSA PCR SCREENING  CULTURE, BLOOD (ROUTINE X 2)  CULTURE, BLOOD (ROUTINE X 2)  EXPECTORATED SPUTUM ASSESSMENT W GRAM STAIN, RFLX TO RESP C  LACTIC ACID, PLASMA  GLUCOSE, CAPILLARY  BLOOD GAS, ARTERIAL  CBC  BASIC METABOLIC PANEL  MAGNESIUM  PHOSPHORUS  BLOOD GAS, ARTERIAL  BLOOD GAS, ARTERIAL    EKG EKG Interpretation  Date/Time:  Tuesday July 03 2020 14:56:32 EDT Ventricular Rate:  133 PR Interval:    QRS Duration: 77 QT Interval:  304 QTC Calculation: 453 R Axis:   0 Text Interpretation: Sinus tachycardia Consider left ventricular hypertrophy Anterior Q waves, possibly due to LVH Confirmed by Aletta Edouard [phone]) on 07/03/2020 2:58:58 PM   Radiology CT ABDOMEN PELVIS WO CONTRAST  Result Date: 07/03/2020 CLINICAL DATA:  Acute nonlocalized abdominal pain EXAM: CT ABDOMEN AND PELVIS WITHOUT CONTRAST TECHNIQUE: Multidetector CT imaging of the abdomen and pelvis was performed following the standard protocol without IV contrast. COMPARISON:  02/28/2020 FINDINGS: Lower chest: Patchy bibasilar nodular airspace disease is identified, which may reflect infection or aspiration. Denser area of consolidation within the posterior basilar segment of the left lower lobe may reflect atelectasis or scarring. No effusion or  pneumothorax. Unenhanced CT was performed per clinician order. Lack of IV contrast limits sensitivity and specificity, especially for evaluation of abdominal/pelvic solid viscera. Hepatobiliary: No focal liver abnormality is seen. No gallstones, gallbladder wall thickening, or biliary dilatation. Pancreas: Unremarkable. No pancreatic ductal dilatation or surrounding inflammatory changes. Spleen: Normal in size without focal abnormality. Adrenals/Urinary Tract: No urinary tract calculi or obstructive uropathy. The bladder is decompressed, limiting its evaluation. The adrenals are normal. Stomach/Bowel: Percutaneous gastrostomy tube within the stomach. There is moderate gaseous distention of the stomach. No bowel obstruction or ileus. Normal appendix right lower quadrant. No bowel wall thickening or inflammatory change. Vascular/Lymphatic: Aortic atherosclerosis. No enlarged abdominal or pelvic lymph nodes. Reproductive: Uterus and bilateral adnexa are unremarkable. Other: Trace pelvic free fluid. No free intraperitoneal gas. No abdominal wall hernia. Musculoskeletal: No acute or destructive bony lesions. There is diffuse subcutaneous edema consistent with anasarca. Reconstructed images demonstrate no additional findings. IMPRESSION: 1. Patchy bibasilar airspace disease consistent with multifocal pneumonia or aspiration. 2. No urinary tract calculi or obstructive uropathy. 3. Trace pelvic free fluid, nonspecific. 4. Diffuse anasarca. 5. Otherwise unremarkable unenhanced exam. Electronically Signed   By: Randa Ngo M.D.   On: 07/03/2020 21:31   DG Chest Port 1 View  Result Date: 07/03/2020 CLINICAL DATA:  Shortness of breath. EXAM: PORTABLE CHEST 1 VIEW COMPARISON:  June 27, 2020 FINDINGS: Tracheostomy catheter tip is 7.5 cm above the carina. No pneumothorax. There is ill-defined airspace opacity in the left mid lung and in both lung bases. There appears to be underlying emphysematous change  in fibrosis. Heart  size and pulmonary vascularity normal. No adenopathy. No bone lesions. IMPRESSION: Tracheostomy as described without pneumothorax. Ill-defined airspace opacity left mid lung and base regions consistent with multifocal pneumonia. Underlying scarring and emphysematous change. Appearance raises concern for atypical organism pneumonia. Check of COVID-19 status advised. Heart size normal.  No adenopathy appreciable. Electronically Signed   By: Lowella Grip III M.D.   On: 07/03/2020 15:29    Procedures .Critical Care Performed by: Lucrezia Starch, MD Authorized by: Lucrezia Starch, MD   Critical care provider statement:    Critical care time (minutes):  45   Critical care was necessary to treat or prevent imminent or life-threatening deterioration of the following conditions:  Respiratory failure   Critical care was time spent personally by me on the following activities:  Discussions with consultants, evaluation of patient's response to treatment, examination of patient, ordering and performing treatments and interventions, ordering and review of laboratory studies, ordering and review of radiographic studies, pulse oximetry, re-evaluation of patient's condition, obtaining history from patient or surrogate and review of old charts     Medications Ordered in ED Medications  vancomycin (VANCOREADY) IVPB 750 mg/150 mL (has no administration in time range)  docusate sodium (COLACE) capsule 100 mg (has no administration in time range)  polyethylene glycol (MIRALAX / GLYCOLAX) packet 17 g (has no administration in time range)  amLODipine (NORVASC) tablet 10 mg (has no administration in time range)  apixaban (ELIQUIS) tablet 5 mg (5 mg Per Tube Given 07/03/20 2217)  arformoterol (BROVANA) nebulizer solution 15 mcg (15 mcg Nebulization Not Given 07/03/20 2347)  aspirin chewable tablet 81 mg (has no administration in time range)  bisacodyl (DULCOLAX) suppository 10 mg (has no administration in  time range)  budesonide (PULMICORT) nebulizer solution 0.25 mg (0.25 mg Nebulization Given 07/03/20 2347)  Chlorhexidine Gluconate Cloth 2 % PADS 6 each (has no administration in time range)  chlorhexidine gluconate (MEDLINE KIT) (PERIDEX) 0.12 % solution 15 mL (15 mLs Mouth Rinse Given 07/03/20 2119)  clonazePAM (KLONOPIN) tablet 1 mg (1 mg Per Tube Given 07/03/20 2217)  levothyroxine (SYNTHROID) tablet 50 mcg (has no administration in time range)  lip balm (CARMEX) ointment (has no administration in time range)  multivitamin with minerals tablet 1 tablet (has no administration in time range)  oxyCODONE-acetaminophen (PERCOCET/ROXICET) 5-325 MG per tablet 1 tablet (has no administration in time range)  pantoprazole sodium (PROTONIX) 40 mg/20 mL oral suspension 40 mg (40 mg Per Tube Given 07/03/20 2218)  phenol (CHLORASEPTIC) mouth spray 1 spray (has no administration in time range)  polyethylene glycol (MIRALAX / GLYCOLAX) packet 17 g (has no administration in time range)  prednisoLONE acetate (PRED FORTE) 1 % ophthalmic suspension 1 drop (1 drop Left Eye Not Given 07/03/20 2218)  revefenacin (YUPELRI) nebulizer solution 175 mcg (175 mcg Nebulization Not Given 07/03/20 1742)  rosuvastatin (CRESTOR) tablet 40 mg (has no administration in time range)  senna-docusate (Senokot-S) tablet 2 tablet (has no administration in time range)  sodium chloride (OCEAN) 0.65 % nasal spray 1 spray (has no administration in time range)  temazepam (RESTORIL) capsule 15 mg (15 mg Per Tube Given 07/03/20 2217)  venlafaxine (EFFEXOR) tablet 37.5 mg (has no administration in time range)  insulin aspart (novoLOG) injection 2-6 Units (has no administration in time range)  ceFEPIme (MAXIPIME) 2 g in sodium chloride 0.9 % 100 mL IVPB (has no administration in time range)  fentaNYL (SUBLIMAZE) injection 12.5-25 mcg (25 mcg Intravenous Given 07/03/20 2042)  simethicone (MYLICON) 40 AV/6.9VX suspension 40 mg (40 mg Per Tube Given  07/03/20 2217)  MEDLINE mouth rinse (has no administration in time range)  ceFEPIme (MAXIPIME) 2 g in sodium chloride 0.9 % 100 mL IVPB (0 g Intravenous Stopped 07/03/20 1713)  vancomycin (VANCOREADY) IVPB 1250 mg/250 mL (0 mg Intravenous Stopped 07/03/20 1920)    ED Course  I have reviewed the triage vital signs and the nursing notes.  Pertinent labs & imaging results that were available during my care of the patient were reviewed by me and considered in my medical decision making (see chart for details).    MDM Rules/Calculators/A&P                         55 year old lady who is trach/vent dependent presented to ER after developing vent issues at home, becoming altered.  In ER, patient was placed on our ventilator, she was not in extremis when I evaluated her, vital signs were stable.  Basic labs were obtained, CXR obtained.  Concern for possible pneumonia, significant leukocytosis.  Started on broad-spectrum antibiotics to cover for possible HCAP.  Consulted pulmonary critical care to evaluate patient.  They had followed patient closely while just in hospital.  They will admit for further management.  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

## 2020-07-04 NOTE — Plan of Care (Signed)
  Problem: Activity: Goal: Ability to tolerate increased activity will improve Outcome: Not Met (add Reason)   Problem: Respiratory: Goal: Ability to maintain a clear airway and adequate ventilation will improve Outcome: Not Met (add Reason)   Problem: Role Relationship: Goal: Method of communication will improve Outcome: Not Met (add Reason)   Just admitted. Not ready for education.  Jefferson Fuel, RN 07/04/2020 5:30 AM

## 2020-07-04 NOTE — Progress Notes (Signed)
NAME:  Kathleen Cameron, MRN:  [id number], DOB:  [birth year], LOS: 1 ADMISSION DATE:  07/03/2020, CONSULTATION DATE:  07/03/2020 REFERRING MD:  Dr. Stevie Kern EDP CHIEF COMPLAINT:  Respiratory failure   Brief History:  55 year old female on home vent since 02/2020 with end stage COPD. Recently admitted after home vent failed found to have pneumonia. Course complicated by DVT, GI bleeding, and new diagnosis of gout. Discharged to home 3/14. Developed profound hypoxia in the setting of likely mucous plug on 3/15 prompting EMS transfer to ED. Sats 50% on arrival.   Past Medical History:   has a past medical history of Acute on chronic respiratory failure with hypoxia (HCC), Anxiety disorder, Asthma, COPD (chronic obstructive pulmonary disease) (HCC), COPD, severe (HCC), Hypertension, and Seizure disorder (HCC).  Significant Hospital Events:  3/15 Admit  Consults:    Procedures:  Trach 6 Shiley cuffed (present on admission) >> PEG (present on admission) >>  Significant Diagnostic Tests:   07/03/2020 CXR >> Tracheostomy, no PTX, ill-defined airspace opacity L mid-lung and base regions c/w multifocal PNA, underlying scarring/emphysematous change, concern for atypical organism PNA  07/03/2020 CT A/P >> Patchy bibasilar airspace disease c/w multifocal PNA/aspiration, no urinary tract calculi or obstructive uropathy, trace free pelvic fluid, diffuse anasarca  07/04/2020 CXR >> Persistent multifocal PNA, superimposed pulmonary edema not excluded, bilateral trace pleural effusions  Micro Data:  Blood 3/15 >> Sputum 3/15 >>  Antimicrobials:  Cefepime 3/15 >> 3/16 Vanc 3/15 >> 3/16  Interim History / Subjective:  Tearful this morning, wants to go home Reporting abdominal pain/fullness States legs feel swollen and "heavy" and that she can't walk Breathing feels tight, denies SOB/CP  Objective   Blood pressure 111/90, pulse 81, temperature 97.6 F (36.4 C), temperature source Oral, resp. rate  (!) 25, weight 58.1 kg, SpO2 100 %.    Vent Mode: PRVC FiO2 (%):  [50 %-100 %] 50 % Set Rate:  [16 bmp-25 bmp] 25 bmp Vt Set:  [480 mL] 480 mL PEEP:  [5 cmH20] 5 cmH20 Plateau Pressure:  [27 cmH20-33 cmH20] 30 cmH20   Intake/Output Summary (Last 24 hours) at 07/04/2020 0856 Last data filed at 07/04/2020 0500 Gross per 24 hour  Intake 198.08 ml  Output 350 ml  Net -151.92 ml   Filed Weights   07/03/20 2000 07/04/20 0500  Weight: 57.6 kg 58.1 kg   Physical Examination: General: Chronically ill-appearing frail woman in NAD. HEENT: [place]/AT, anicteric sclera, PERRL, moist mucous membranes. Tracheostomy in place. Neuro: Awake, oriented x 4. Responds to verbal stimuli. Following commands consistently. Moves all 4 extremities spontaneously. CV: RRR, no m/g/r. PULM: Breathing even and unlabored on vent (PEEP 5, FiO2 40%). Lung fields with rhonchi bilaterally, diminished at bases. GI: Soft, mild-moderate distention, generalized TTP. Slightly hyperactive bowel sounds. Extremities: Bilateral symmetric 1+ pitting LE edema noted. Skin: Warm/dry, no rashes noted.  Resolved Hospital Problem list     Assessment & Plan:   Acute on chronic hypercarbic and hypoxemic respiratory failure Likely suffered mucous plug at home less than 24 hours after discharge, which family and home health staff were unable to remedy. Ventilator seemed to be working fine and was alarming for high pressure. Cannot rule out HCAP, although this seems unlikely she was discharged only yesterday.  - Continue ICU vent optimization - Chest PT - Empiric Cefepime/Vanc to end today (3/16) - F/u BCx - Discharge home is almost certain to result in more episodes like this, and concern remains that patient will decompensate before reaching  ED; plan to pursue vent/SNF - though patient/husband are reluctant, more amenable if facility is located in [place]; plan to reach out to TOC/SW  COPD without acute exacerbation - Continue home  budesonide/Brovana/Yupelri - Pulmonary hygiene as above  Abdominal distention CT demonstrating gaseous distention of the stomach, no other significant abdominal findings - Vent G-tube vs. NGT for symptomatic relief  Left subclavian DVT - Continue Eliquis  Encephalopathy, chronic: related to extended ICU stays and associated delirium.  Depression - Continue home venlafaxine, temazepam, clonazepam  Hypertension - Continue home Norvasc, ASA  Hypothyroid - Continue home Synthroid  Nutrition - Continue supplemental TFs - Continue regular diet  ? Gout: R great toe. - Continue to monitor for symptoms - Hold colchicine  Best practice (evaluated daily)  Diet: TF, regular diet Pain/Anxiety/Delirium protocol (if indicated): NA VAP protocol (if indicated): per protocol DVT prophylaxis: Eliquis GI prophylaxis: PPI Glucose control: TF coverage + SSI Mobility: Bedrest Disposition: ICU  Goals of Care:  Last date of multidisciplinary goals of care discussion: 3/15 Family and staff present:  Summary of discussion:  Follow up goals of care discussion due: 3/22 Code Status: Full  Critical care time: 34 minutes   Tim Lair, PA-C [facility] 07/04/20 8:57 AM  Please see [email or web address] for pager details.

## 2020-07-05 LAB — CBC
HCT: 22.3 % — ABNORMAL LOW (ref 36.0–46.0)
HCT: 23.5 % — ABNORMAL LOW (ref 36.0–46.0)
Hemoglobin: 6.8 g/dL — CL (ref 12.0–15.0)
Hemoglobin: 7.2 g/dL — ABNORMAL LOW (ref 12.0–15.0)
MCH: 27.6 pg (ref 26.0–34.0)
MCH: 27.9 pg (ref 26.0–34.0)
MCHC: 30.5 g/dL (ref 30.0–36.0)
MCHC: 30.6 g/dL (ref 30.0–36.0)
MCV: 90.7 fL (ref 80.0–100.0)
MCV: 91.1 fL (ref 80.0–100.0)
Platelets: 278 10*3/uL (ref 150–400)
Platelets: 299 10*3/uL (ref 150–400)
RBC: 2.46 MIL/uL — ABNORMAL LOW (ref 3.87–5.11)
RBC: 2.58 MIL/uL — ABNORMAL LOW (ref 3.87–5.11)
RDW: 16.8 % — ABNORMAL HIGH (ref 11.5–15.5)
RDW: 16.9 % — ABNORMAL HIGH (ref 11.5–15.5)
WBC: 10.1 10*3/uL (ref 4.0–10.5)
WBC: 9.9 10*3/uL (ref 4.0–10.5)
nRBC: 0 % (ref 0.0–0.2)
nRBC: 0 % (ref 0.0–0.2)

## 2020-07-05 LAB — GLUCOSE, CAPILLARY
Glucose-Capillary: 92 mg/dL (ref 70–99)
Glucose-Capillary: 76 mg/dL (ref 70–99)
Glucose-Capillary: 129 mg/dL — ABNORMAL HIGH (ref 70–99)
Glucose-Capillary: 128 mg/dL — ABNORMAL HIGH (ref 70–99)
Glucose-Capillary: 129 mg/dL — ABNORMAL HIGH (ref 70–99)
Glucose-Capillary: 107 mg/dL — ABNORMAL HIGH (ref 70–99)

## 2020-07-05 MED ORDER — ADULT MULTIVITAMIN W/MINERALS CH
1.0000 | ORAL_TABLET | Freq: Every day | ORAL | Status: DC
  Administered 2020-07-06: 1 via ORAL
  Filled 2020-07-05: qty 1

## 2020-07-05 MED ORDER — COVID-19 MRNA VACC (MODERNA) 100 MCG/0.5ML IM SUSP
0.5000 mL | Freq: Once | INTRAMUSCULAR | Status: AC
  Administered 2020-07-05: 0.5 mL via INTRAMUSCULAR
  Filled 2020-07-05: qty 0.5

## 2020-07-05 MED ORDER — POLYETHYLENE GLYCOL 3350 17 G PO PACK
17.0000 g | PACK | Freq: Every day | ORAL | Status: DC | PRN
  Filled 2020-07-05: qty 1

## 2020-07-05 NOTE — Progress Notes (Signed)
Upon assessment, patient states she is having severe abdomen pain. She is passing gas, abdomen is firm and distended. Bowel sound heard in all quadrants. Elink called and notified.

## 2020-07-05 NOTE — Progress Notes (Signed)
Initial Nutrition Assessment  DOCUMENTATION CODES:   Not applicable  INTERVENTION:    Ensure Enlive po TID, each supplement provides 350 kcal and 20 grams of protein.   Continue regular diet.   MVI with minerals 1 tablet PO daily.   Hold TF via PEG. If PO intake declines, can restart bolus feedings.  NUTRITION DIAGNOSIS:   Inadequate oral intake related to chronic illness (COPD on chronic vent support) as evidenced by per patient/family report.  Ongoing  GOAL:   Patient will meet greater than or equal to 90% of their needs   Met  MONITOR:   PO intake,Supplement acceptance,Labs,Skin  REASON FOR ASSESSMENT:   Consult Enteral/tube feeding initiation and management  ASSESSMENT:   55 yo female admitted with profound hypoxia likely r/t mucous plug. PMH includes ESCOPD on home vent, trach, PEG, anxiety, asthma, HTN, seizure disorder, recent GIB and PNA, gout.   Discussed patient in ICU rounds and with RN today. TF on hold due to abdominal pain. PEG tube has been used for decompression.  According to RN, patient has been eating very well and drinking her supplements. She is also taking her pills well by mouth. Plans to leave TF off for now.  Spoke with patient and her husband at bedside today. Patient has been eating very well. Eating almost 100% of her meals and drinking Ensure Enlive between meals.   Calorie count for the past 24 hours (Lunch 3/16 through breakfast 3/17 plus 2 Ensure Enlive supplements): 2000 kcal, 90 gm protein PO intake is meeting 100% of nutrition needs. Will continue Ensure Enlive supplements TID to ensure adequate protein intake.    Patient is on chronic ventilator support via trach MV: 11.4 L/minTemp (24hrs), Avg:98.2 F (36.8 C), Min:97.6 F (36.4 C), Max:98.6 F (37 C)   Labs reviewed.  CBG: [phone]  Medications reviewed and include Novolog SSI, MVI with minerals, Protonix.  Patient does not meet criteria for malnutrition at  this time.   NUTRITION - FOCUSED PHYSICAL EXAM:  Flowsheet Row Most Recent Value  Orbital Region No depletion  Upper Arm Region Unable to assess  Thoracic and Lumbar Region Unable to assess  Buccal Region No depletion  Temple Region No depletion  Clavicle Bone Region Mild depletion  Clavicle and Acromion Bone Region No depletion  Scapular Bone Region Unable to assess  Dorsal Hand No depletion  Patellar Region Moderate depletion  Anterior Thigh Region Moderate depletion  Posterior Calf Region Mild depletion  Hair Reviewed  Eyes Reviewed  Mouth Reviewed  Skin Reviewed  Nails Reviewed       Diet Order:   Diet Order            Diet regular Room service appropriate? Yes; Fluid consistency: Thin  Diet effective now                 EDUCATION NEEDS:   Not appropriate for education at this time  Skin:  Skin Integrity Issues:: Stage I,Stage II Stage I: sacrum Stage II: R buttocks  Last BM:  no BM documented  Height:   Ht Readings from Last 1 Encounters:  06/25/20 $RemoveB'5\' 4"'IEynXMFX$  (1.626 m)    Weight:   Wt Readings from Last 1 Encounters:  07/05/20 58 kg    BMI:  Body mass index is 21.95 kg/m.  Estimated Nutritional Needs:   Kcal:  1600-1900  Protein:  80-90 gm  Fluid:  >/= 1.7 L    Lucas Mallow, RD, LDN, CNSC Please refer to Amion for contact  information.

## 2020-07-05 NOTE — Progress Notes (Addendum)
10:30am: CSW spoke with patient at bedside - she is able to communicate nonverbally. Patient shook her head no when asked if she desired to go to a facility at discharge. Patient shook her head yes when asked if she wanted to return home with her husband. Patient shook her head yes when asked if felt safe returning home with her husband with home health services. CSW informed patient of MD concerns regarding the possibility of a similar event occurring - she shook her head yes to understanding.  Patient's husband arrived at bedside - he is adamant that he has the ability to care for his wife with the assistance of [facility] RN Monday through Friday from 8am to 5pm. Earvin Hansen reports satisfaction with [facility] staff. CSW repeated MD concerns, Earvin Hansen acknowledges concerns but states he can handle issues if they arise. Earvin Hansen requesting patient be discharged on Monday 07/09/20 because per [facility], that is when [facility] services will resume.  8:15am: CSW spoke with patient's husband Earvin Hansen via phone - Earvin Hansen reports he and his wife do not want placement in a facility. Earvin Hansen reports he is able to manage the home vent and feels comfortable providing care for his wife. Earvin Hansen reports his wife was recently approved for Medicaid. Earvin Hansen reports his wife receives [facility] services through [place] and DME through [employer].   Edwin Dada, MSW, LCSW Transitions of Care  Clinical Social Worker II [phone]

## 2020-07-05 NOTE — Progress Notes (Signed)
NAME:  Cicilia Clinger, MRN:  [id number], DOB:  [birth year], LOS: 2 ADMISSION DATE:  07/03/2020, CONSULTATION DATE:  07/03/2020 REFERRING MD: Dr. Roslynn Amble EDP, CHIEF COMPLAINT:  Respiratory failure  Brief History:  Mrs. Belkys Henault is a 55 year old female on home vent since 02/2020 with end stage COPD. Recently admitted after home vent failed and found to have pneumonia. Course complicated by DVT, GI bleeding, and new diagnosis of gout. Discharged to home 3/14. Developed profound hypoxia in the setting of likely mucous plug on 3/15 prompting EMS transfer to ED. She was found to have O2 Sat of 50% on arrival.   History of Present Illness:  55 year old female with PMH as below, which is significant for chronic hypoxemic respiratory failure on home vent via trach placed November of 2021 after COPD exacerbation and failure to liberate from the vent. She was treated in [facility] and ultimately discharged 2/12 to home with her husband as her primary caretaker. She was recently admitted to [facility] 2/13 after home ventilator complication. At that time she was diagnosed with pneumonia and COPD exacerbation, improving with the usual therapies. 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 She was discharged to home on 3/14.  Husband describes no issues after arriving home that night and into the following morning on 3/15. However, later that day she became dyspneic and asked for suctioning. Husband and home health nurse attempted to suction without benefit. She continued to desaturate. Lurline Idol was changed. No plug identified and she did not improve. When she became less responsive EMS was alerted. Upon their arrival her O2 sats were in the 73s. Sats and mental status improved with BVM ventilations. She was transported to ED for further evaluation where she was treated with empiric antibiotics. PCCM asked to evaluate.   She has remained  on her home trilogy during her ICU stay. Her antibiotics were discontinued on 3/16.   Past Medical History:  Acute on chronic respiratory failure with hypoxia (HCC), Anxiety disorder, Asthma, COPD (chronic obstructive pulmonary disease) ([place]), COPD, severe ([place]), Hypertension, and Seizure disorder ([place]).  Significant Hospital Events:  3/15 Admit  Consults:  None  Procedures:  None  Significant Diagnostic Tests:  07/03/2020 CXR >> Tracheostomy, no PTX, ill-defined airspace opacity L mid-lung and base regions c/w multifocal PNA, underlying scarring/emphysematous change, concern for atypical organism PNA 07/03/2020 CT A/P >> Patchy bibasilar airspace disease c/w multifocal PNA/aspiration, no urinary tract calculi or obstructive uropathy, trace free pelvic fluid, diffuse anasarca 07/04/2020 CXR >> Persistent multifocal PNA, superimposed pulmonary edema not excluded, bilateral trace pleural effusions  Micro Data:  Blood 3/15 >> NGTD Covid-19, Flu 3/15 >>Negative MRSA PCR 3/15 >> Negative  Antimicrobials:  Cefepime 3/15 >> 3/16 Vanc 3/15 >> 3/16  Interim History / Subjective:  Tolerated home trilogy overnight without any problems. She was able to eat all her breakfast this morning. She denied any CP, SOB or any pain. States her feet has been throbbing for some time.   Objective   Blood pressure 105/68, pulse 83, temperature 98.1 F (36.7 C), temperature source Oral, resp. rate 16, weight 58 kg, SpO2 100 %.    Vent Mode: AC FiO2 (%):  [40 %-50 %] 40 % Set Rate:  [16 bmp] 16 bmp Vt Set:  [480 mL] 480 mL PEEP:  [5 cmH20] 5 cmH20 Plateau Pressure:  [18 cmH20-35 cmH20] 18 cmH20   Intake/Output Summary (Last 24 hours) at 07/05/2020 1214  Last data filed at 07/05/2020 1115 Gross per 24 hour  Intake -  Output 450 ml  Net -450 ml   Filed Weights   07/03/20 2000 07/04/20 0500 07/05/20 0320  Weight: 57.6 kg 58.1 kg 58 kg    Examination: General: Chronically ill but pleasant woman  laying comfortably in bed in no acute distress. HEENT: [place]/AT. Trach in place.  Lungs: CTAB on anterior chest wall. No wheezing or rales. Decreased lung sounds. Cardiovascular: RRR. No m/r/g. 1+ BLE edema. Abdomen: Soft. Mild distention. Non-tender. Normal BS.  Extremities: Well-perfused Neuro: Awake and alert. No focal deficits. Moves all extremities.   Assessment & Plan:  Acute on chronic hypoxemic and hypercarbic respiratory failure Chronic trach and vent dependent Tolerating home Trilogy machine well. Medical team discussed again with patient and spouse about our concerns about discharge home. Patient likely to be readmitted to the hospital without the appropriate ventilator care and managment. Spouse and pt still insisting on patient coming back home on Monday. Leukolysis now resolved, remains afebrile and blood cultures negative to date so unlikely to be a superimposed infection.  P: --Vent Setting - PEEP 5, RR 16, FiO2 40, TV 480 --Routine trach care --F/u morning AM --Refused SNF placement --Discharge home Monday  COPD Chronic and stable. --Continue home Pulmicort/Brovana/Yupelri Nebulizers --O2 goal 88-92%  Nutrition Deconditioning --RD following, appreciate recs --Continue TF via PEG --MVI w/ minerals --PT/OT  Normocytic Anemia Hgb dropped from 7.9 yesterday to 6.8 today. Unknown baseline. No signs of active bleeds. Will continue to monitor closely. --F/u morning CBC  Abdominal distention, improved --Continue Vent G-tube  Chronic encephalopathy Depression --Continue Effexor 37.5 mg per Tube BID --Continue home Temazepam and Clonazepam  Hypothyroidism --Continue Synthroid 50 mcg daily  HTN --Continue home Norvasc 10 mg daily via Tube   HLD --Continue Crestor 40 mg daily per Tube --Continue ASA 81 mg per Tube   Best practice (evaluated daily)  Diet: Tube Feeds, Regular Pain/Anxiety/Delirium protocol (if indicated): N/A VAP protocol (if indicated): per  protocol DVT prophylaxis: Eliquis GI prophylaxis: PPI Glucose control: TF coverage plus SSI Mobility: Bedrest Disposition: ICU, plan for discharge Monday  Goals of Care:  Last date of multidisciplinary goals of care discussion: 3/15 Family and staff present:  Summary of discussion:  Follow up goals of care discussion due: 3/22 Code Status: Full  Labs   CBC: Recent Labs  Lab 06/29/20 0155 06/30/20 0111 07/01/20 0634 07/03/20 1453 07/03/20 1648 07/03/20 2003 07/03/20 2130 07/04/20 0733 07/05/20 0908  WBC 7.2 9.0 9.5 29.1*  --   --   --  12.1* 10.1  NEUTROABS 4.5 6.0 6.4 25.6*  --   --   --   --   --   HGB 7.3* 8.0* 7.9* 8.9* 9.2* 9.9* 9.2* 7.9* 6.8*  HCT 24.0* 25.8* 25.2* 29.7* 27.0* 29.0* 27.0* 25.9* 22.3*  MCV 92.0 92.1 91.0 94.0  --   --   --  90.6 90.7  PLT 241 278 293 507*  --   --   --  378 785    Basic Metabolic Panel: Recent Labs  Lab 06/29/20 0155 06/30/20 0111 07/01/20 0634 07/03/20 1453 07/03/20 1648 07/03/20 2003 07/03/20 2130 07/04/20 0733  NA 138 142 138 138 136 139 139 137  K 3.9 3.9 4.0 4.1 3.8 4.1 4.1 4.0  CL 99 101 99 98  --   --   --  97*  CO2 35* 34* 34* 31  --   --   --  28  GLUCOSE 124* 104* 84 152*  --   --   --  80  BUN $Re'17 16 17 17  'vTr$ --   --   --  24*  CREATININE 0.42* 0.41* 0.42* 0.67  --   --   --  0.68  CALCIUM 9.1 9.1 9.2 10.2  --   --   --  9.3  MG  --   --   --   --   --   --   --  1.9  PHOS  --   --   --   --   --   --   --  5.4*   GFR: Estimated Creatinine Clearance: 67.8 mL/min (by C-G formula based on SCr of 0.68 mg/dL). Recent Labs  Lab 07/01/20 0634 07/03/20 1453 07/03/20 1628 07/04/20 0733 07/05/20 0908  WBC 9.5 29.1*  --  12.1* 10.1  LATICACIDVEN  --   --  0.9  --   --     Liver Function Tests: Recent Labs  Lab 06/29/20 0155 06/30/20 0111 07/01/20 0634  AST 33 37 42*  ALT 35 37 41  ALKPHOS 70 74 75  BILITOT 0.3 0.7 0.6  PROT 5.7* 6.1* 6.3*  ALBUMIN 2.0* 2.1* 2.2*   No results for input(s): LIPASE,  AMYLASE in the last 168 hours. No results for input(s): AMMONIA in the last 168 hours.  ABG    Component Value Date/Time   PHART 7.391 07/03/2020 2130   PCO2ART 56.2 (H) 07/03/2020 2130   PO2ART 132 (H) 07/03/2020 2130   HCO3 34.3 (H) 07/03/2020 2130   TCO2 36 (H) 07/03/2020 2130   O2SAT 99.0 07/03/2020 2130     Coagulation Profile: No results for input(s): INR, PROTIME in the last 168 hours.  Cardiac Enzymes: No results for input(s): CKTOTAL, CKMB, CKMBINDEX, TROPONINI in the last 168 hours.  HbA1C: Hgb A1c MFr Bld  Date/Time Value Ref Range Status  06/21/2020 02:24 AM 5.3 4.8 - 5.6 % Final    Comment:    (NOTE) Pre diabetes:          5.7%-6.4%  Diabetes:              >6.4%  Glycemic control for   <7.0% adults with diabetes   02/26/2020 05:18 PM 5.4 4.8 - 5.6 % Final    Comment:    (NOTE) Pre diabetes:          5.7%-6.4%  Diabetes:              >6.4%  Glycemic control for   <7.0% adults with diabetes     CBG: Recent Labs  Lab 07/04/20 1905 07/04/20 2303 07/05/20 0304 07/05/20 0704 07/05/20 1101  GLUCAP 114* 114* 92 76 129*    Review of Systems:   As stated in the HPI  Past Medical History:  She,  has a past medical history of Acute on chronic respiratory failure with hypoxia ([place]), Anxiety disorder, Asthma, COPD (chronic obstructive pulmonary disease) ([place]), COPD, severe ([place]), Hypertension, and Seizure disorder ([place]).   Surgical History:   Past Surgical History:  Procedure Laterality Date  . COLONOSCOPY N/A 06/26/2020   Procedure: COLONOSCOPY;  Surgeon: Carol Ada, MD;  Location: [place];  Service: Endoscopy;  Laterality: N/A;  . ENTEROSCOPY N/A 06/26/2020   Procedure: ENTEROSCOPY;  Surgeon: Carol Ada, MD;  Location: [place];  Service: Endoscopy;  Laterality: N/A;  . HEMOSTASIS CLIP PLACEMENT  06/26/2020   Procedure: HEMOSTASIS CLIP PLACEMENT;  Surgeon: Carol Ada, MD;  Location:  [facility];  Service: Endoscopy;;  . HOT  HEMOSTASIS N/A 06/26/2020   Procedure: HOT HEMOSTASIS (ARGON PLASMA COAGULATION/BICAP);  Surgeon: Carol Ada, MD;  Location: [place];  Service: Endoscopy;  Laterality: N/A;  . IR FLUORO RM 30-60 MIN  03/23/2020  . LAPAROSCOPIC INSERTION GASTROSTOMY TUBE N/A 04/24/2020   Procedure: LAPAROSCOPIC GASTROSTOMY TUBE PLACEMENT;  Surgeon: Kieth Brightly Arta Bruce, MD;  Location: [place];  Service: General;  Laterality: N/A;  . POLYPECTOMY  06/26/2020   Procedure: POLYPECTOMY;  Surgeon: Carol Ada, MD;  Location: [place];  Service: Endoscopy;;  . WISDOM TOOTH EXTRACTION       Social History:   reports that she has never smoked. She has never used smokeless tobacco. She reports current alcohol use of about 2.0 standard drinks of alcohol per week. She reports that she does not use drugs.   Family History:  Her Family history is unknown by patient.   Allergies Allergies  Allergen Reactions  . Stiolto Respimat [Tiotropium Bromide-Olodaterol] Other (See Comments)    Severe headaches and rapid heartrate     Home Medications  Prior to Admission medications   Medication Sig Start Date End Date Taking? Authorizing Provider  amLODipine (NORVASC) 5 MG tablet Place 2 tablets (10 mg total) into feeding tube daily. 03/09/20  Yes Candee Furbish, MD  apixaban (ELIQUIS) 5 MG TABS tablet Place 1 tablet (5 mg total) into feeding tube 2 (two) times daily. 07/02/20 10/30/20 Yes Hall, Carole N, DO  arformoterol (BROVANA) 15 MCG/2ML NEBU Take 2 mLs (15 mcg total) by nebulization 2 (two) times daily. 03/08/20  Yes Candee Furbish, MD  aspirin 81 MG chewable tablet Place 1 tablet (81 mg total) into feeding tube daily. 03/09/20  Yes Candee Furbish, MD  bisacodyl (DULCOLAX) 10 MG suppository Place 1 suppository (10 mg total) rectally daily as needed for severe constipation. 03/08/20  Yes Candee Furbish, MD  budesonide (PULMICORT) 0.25 MG/2ML nebulizer solution Take 2 mLs (0.25 mg total) by nebulization 2 (two) times  daily. 03/08/20  Yes Candee Furbish, MD  Chlorhexidine Gluconate Cloth 2 % PADS Apply 6 each topically daily. 03/09/20  Yes Candee Furbish, MD  chlorhexidine gluconate, MEDLINE KIT, (PERIDEX) 0.12 % solution 15 mLs by Mouth Rinse route 2 (two) times daily. 03/08/20  Yes Candee Furbish, MD  clonazePAM (KLONOPIN) 1 MG tablet Place 1 tablet (1 mg total) into feeding tube 3 (three) times daily. 03/08/20  Yes Candee Furbish, MD  levothyroxine (SYNTHROID) 50 MCG tablet Take 1 tablet (50 mcg total) by mouth daily at 6 (six) AM. 02/03/20  Yes Charlynne Cousins, MD  lip balm (CARMEX) ointment Apply topically as needed for lip care. 03/08/20  Yes Candee Furbish, MD  Multiple Vitamin (MULTIVITAMIN WITH MINERALS) TABS tablet Place 1 tablet into feeding tube daily. 07/03/20 10/01/20 Yes Kayleen Memos, DO  Nutritional Supplements (FEEDING SUPPLEMENT, OSMOLITE 1.2 CAL,) LIQD Place 1,000 mLs into feeding tube continuous. 03/08/20  Yes Candee Furbish, MD  oxyCODONE-acetaminophen (PERCOCET/ROXICET) 5-325 MG tablet Place 1 tablet into feeding tube every 8 (eight) hours as needed for up to 3 days for severe pain. 07/02/20 07/05/20 Yes Hall, Carole N, DO  pantoprazole sodium (PROTONIX) 40 mg/20 mL PACK Place 20 mLs (40 mg total) into feeding tube at bedtime. 03/08/20  Yes Candee Furbish, MD  phenol (CHLORASEPTIC) 1.4 % LIQD Use as directed 1 spray in the mouth or throat as needed for throat irritation / pain. 03/08/20  Yes  Candee Furbish, MD  polyethylene glycol (MIRALAX / GLYCOLAX) 17 g packet Place 17 g into feeding tube daily as needed for mild constipation. 03/08/20  Yes Candee Furbish, MD  revefenacin [facility]) 175 MCG/3ML nebulizer solution Take 3 mLs (175 mcg total) by nebulization daily. 03/09/20  Yes Candee Furbish, MD  rosuvastatin (CRESTOR) 40 MG tablet Place 1 tablet (40 mg total) into feeding tube daily. 03/08/20  Yes Candee Furbish, MD  senna-docusate (SENOKOT-S) 8.6-50 MG tablet Place 2 tablets  into feeding tube at bedtime as needed for moderate constipation. 03/08/20  Yes Candee Furbish, MD  sodium chloride (OCEAN) 0.65 % SOLN nasal spray Place 1 spray into both nostrils as needed for congestion. 03/08/20  Yes Candee Furbish, MD  temazepam (RESTORIL) 15 MG capsule Place 1 capsule (15 mg total) into feeding tube at bedtime. 03/08/20  Yes Candee Furbish, MD  venlafaxine [facility]) 37.5 MG tablet Place 1 tablet (37.5 mg total) into feeding tube 2 (two) times daily with a meal. 03/08/20  Yes Candee Furbish, MD  prednisoLONE acetate (PRED FORTE) 1 % ophthalmic suspension Place 1 drop into the left eye 4 (four) times daily. Patient not taking: Reported on 07/04/2020 03/08/20   Candee Furbish, MD     Critical care time: 15

## 2020-07-05 NOTE — Progress Notes (Signed)
eLink Physician-Brief Progress Note Patient Name: Kathleen Cameron DOB: 1965-12-12 MRN: [id number]   Date of Service  07/05/2020  HPI/Events of Note  Patient needs a.m. CBC with diff.  eICU Interventions  CBC with diff ordered for the a.m.     Intervention Category Minor Interventions: Clinical assessment - ordering diagnostic tests  Migdalia Dk 07/05/2020, 11:38 PM

## 2020-07-06 LAB — CBC WITH DIFFERENTIAL/PLATELET
Abs Immature Granulocytes: 0.07 10*3/uL (ref 0.00–0.07)
Basophils Absolute: 0 10*3/uL (ref 0.0–0.1)
Basophils Relative: 0 %
Eosinophils Absolute: 0.5 10*3/uL (ref 0.0–0.5)
Eosinophils Relative: 4 %
HCT: 23.1 % — ABNORMAL LOW (ref 36.0–46.0)
Hemoglobin: 7 g/dL — ABNORMAL LOW (ref 12.0–15.0)
Immature Granulocytes: 1 %
Lymphocytes Relative: 15 %
Lymphs Abs: 1.6 10*3/uL (ref 0.7–4.0)
MCH: 27.5 pg (ref 26.0–34.0)
MCHC: 30.3 g/dL (ref 30.0–36.0)
MCV: 90.6 fL (ref 80.0–100.0)
Monocytes Absolute: 0.6 10*3/uL (ref 0.1–1.0)
Monocytes Relative: 6 %
Neutro Abs: 7.7 10*3/uL (ref 1.7–7.7)
Neutrophils Relative %: 74 %
Platelets: 276 10*3/uL (ref 150–400)
RBC: 2.55 MIL/uL — ABNORMAL LOW (ref 3.87–5.11)
RDW: 16.9 % — ABNORMAL HIGH (ref 11.5–15.5)
WBC: 10.4 10*3/uL (ref 4.0–10.5)
nRBC: 0 % (ref 0.0–0.2)

## 2020-07-06 LAB — CULTURE, RESPIRATORY W GRAM STAIN: Culture: NORMAL

## 2020-07-06 LAB — HEMOGLOBIN AND HEMATOCRIT, BLOOD
HCT: 21.9 % — ABNORMAL LOW (ref 36.0–46.0)
HCT: 24 % — ABNORMAL LOW (ref 36.0–46.0)
Hemoglobin: 6.7 g/dL — CL (ref 12.0–15.0)
Hemoglobin: 7.6 g/dL — ABNORMAL LOW (ref 12.0–15.0)

## 2020-07-06 LAB — PHOSPHORUS: Phosphorus: 2.6 mg/dL (ref 2.5–4.6)

## 2020-07-06 LAB — BASIC METABOLIC PANEL
Anion gap: 6 (ref 5–15)
BUN: 13 mg/dL (ref 6–20)
CO2: 33 mmol/L — ABNORMAL HIGH (ref 22–32)
Calcium: 8.9 mg/dL (ref 8.9–10.3)
Chloride: 100 mmol/L (ref 98–111)
Creatinine, Ser: 0.46 mg/dL (ref 0.44–1.00)
GFR, Estimated: >60 mL/min (ref >60)
Glucose, Bld: 100 mg/dL — ABNORMAL HIGH (ref 70–99)
Potassium: 3.4 mmol/L — ABNORMAL LOW (ref 3.5–5.1)
Sodium: 139 mmol/L (ref 135–145)

## 2020-07-06 LAB — GLUCOSE, CAPILLARY
Glucose-Capillary: 100 mg/dL — ABNORMAL HIGH (ref 70–99)
Glucose-Capillary: 73 mg/dL (ref 70–99)
Glucose-Capillary: 75 mg/dL (ref 70–99)
Glucose-Capillary: 97 mg/dL (ref 70–99)

## 2020-07-06 LAB — PREPARE RBC (CROSSMATCH)

## 2020-07-06 MED ORDER — POLYETHYLENE GLYCOL 3350 17 G PO PACK
17.0000 g | PACK | Freq: Two times a day (BID) | ORAL | Status: DC
  Administered 2020-07-06 – 2020-07-08 (×6): 17 g
  Filled 2020-07-06 (×6): qty 1

## 2020-07-06 MED ORDER — SODIUM CHLORIDE 0.9% IV SOLUTION: Freq: Once | INTRAVENOUS | Status: AC

## 2020-07-06 MED ORDER — POTASSIUM CHLORIDE 20 MEQ PO PACK
40.0000 meq | PACK | Freq: Once | ORAL | Status: AC
  Administered 2020-07-06: 40 meq
  Filled 2020-07-06: qty 2

## 2020-07-06 NOTE — Progress Notes (Signed)
K 3.4 Electrolytes replaced per protocol 

## 2020-07-06 NOTE — Progress Notes (Signed)
PROGRESS NOTE    Kathleen Cameron  NHA:[id number] DOB: February 26, 1966 DOA: 07/03/2020 PCP: Horald Pollen, MD    Brief Narrative:  55 year old female with history of COPD, chronic hypoxemic respiratory failure on home ventilator via tracheostomy placed in November 2021 after COPD exacerbation and failure to liberate from the ventilator, admission to [facility] and subsequently discharged home admitted to the intensive care unit on 3/1:15 day of discharge with more shortness of breath and asking for suctioning.  She continued to desaturate so husband called EMS and patient was admitted to the intensive care unit, treated with suctioning.  Reportedly she was saturating 50% on ventilator and with poor mentation. 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 3/18, patient ultimately stabilized on her home Trilogy system and transferred to general medical bed.   Assessment & Plan:   Active Problems:   Pressure injury of skin   Acute on chronic respiratory failure with hypercapnia (HCC)  Acute on chronic respiratory failure with hypoxia and hypercapnia: Treated with antibiotics for 2 days, now stabilized on home settings Trelegy machine.  Waiting for arrangements at home to go home today Antibiotics discontinued. Continue trach care, suctioning and pulmonary hygiene.  Anemia of chronic disease: Chronic GI blood loss. Baseline hemoglobin about 8.5-9.  History of angiodysplasia, EGD within 2 weeks.  No evidence of ongoing bleeding.  Hemoglobin more than 7.  Transfuse PRBC for hemoglobin less than 7.  Continue PPI.  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  Discontinue aspirin, there is no benefit of combination.  COPD severe: Currently on vent and Pulmicort/Brovana,  nebulizers.  Hypertension: Blood pressures low normal.  Discontinue amlodipine.  Seizure disorder: Patient is on long-term Klonopin that is continued.  Hypothyroidism: On Synthroid 50 mcg daily.  Continue.  Diet: Regular diet by mouth.  Medications by PEG tube.  Regular tube feeding.  Replace electrolytes. No bowel movement since admission to the hospital, change MiraLAX to scheduled MiraLAX.   DVT prophylaxis:  apixaban (ELIQUIS) tablet 5 mg   Code Status: Full code Family Communication: None at the bedside Disposition Plan: Status is: Inpatient  Remains inpatient appropriate because:Unsafe d/c plan   Dispo: The patient is from: Home              Anticipated d/c is to: Home              Patient currently is medically stable to d/c.   Difficult to place patient No         Consultants:   PCCM  Procedures:   None  Antimicrobials:   Vancomycin and cefepime 3/15-3/16 discontinued.   Subjective: Patient seen and examined.  Denies any complaints.  She is not able to talk, however she understands and is pleasant and answers negative to most of the questions. She does still bloated.  No bowel movement since last 4 days.  Objective: Vitals:   07/06/20 0700 07/06/20 0745 07/06/20 0800 07/06/20 0900  BP: (!) 115/42  114/74 116/73  Pulse: 78  88 84  Resp: _0 Temp:      TempSrc:      SpO2: 100% 98% 100% 100%  Weight:        Intake/Output Summary (Last 24 hours) at 07/06/2020 1004 Last data filed at 07/06/2020 0839 Gross per 24 hour  Intake  1180 ml  Output 850 ml  Net 330 ml   Filed Weights   07/03/20 2000 07/04/20 0500 07/05/20 0320  Weight: 57.6 kg 58.1 kg 58 kg    Examination:  General exam: Chronically sick looking and debilitated lady comfortably lying in bed on tracheostomy and ventilator support.  Pleasant to conversation even though she is not able to speak. Respiratory system: Some conducted airway sounds.  Trach in place.  Looks clean and  dry. Cardiovascular system: S1 & S2 heard, RRR.  Trace bilateral pedal edema. Gastrointestinal system: Abdomen is soft.  Mildly distended.  PEG tube in place.  Bowel sounds present. Central nervous system: Alert and oriented. No focal neurological deficits. Extremities: Symmetric 5 x 5 power.  Generalized weakness.   Data Reviewed: I have personally reviewed following labs and imaging studies  CBC: Recent Labs  Lab 06/30/20 0111 07/01/20 0634 07/03/20 1453 07/03/20 1648 07/03/20 2130 07/04/20 0733 07/05/20 0908 07/05/20 1124 07/06/20 0446  WBC 9.0 9.5 29.1*  --   --  12.1* 10.1 9.9 10.4  NEUTROABS 6.0 6.4 25.6*  --   --   --   --   --  7.7  HGB 8.0* 7.9* 8.9*   < > 9.2* 7.9* 6.8* 7.2* 7.0*  HCT 25.8* 25.2* 29.7*   < > 27.0* 25.9* 22.3* 23.5* 23.1*  MCV 92.1 91.0 94.0  --   --  90.6 90.7 91.1 90.6  PLT 278 293 507*  --   --  378 278 299 276   < > = values in this interval not displayed.   Basic Metabolic Panel: Recent Labs  Lab 06/30/20 0111 07/01/20 0634 07/03/20 1453 07/03/20 1648 07/03/20 2003 07/03/20 2130 07/04/20 0733 07/06/20 0446  NA 142 138 138 136 139 139 137 139  K 3.9 4.0 4.1 3.8 4.1 4.1 4.0 3.4*  CL 101 99 98  --   --   --  97* 100  CO2 34* 34* 31  --   --   --  28 33*  GLUCOSE 104* 84 152*  --   --   --  80 100*  BUN _0 --   --   --  24* 13  CREATININE 0.41* 0.42* 0.67  --   --   --  0.68 0.46  CALCIUM 9.1 9.2 10.2  --   --   --  9.3 8.9  MG  --   --   --   --   --   --  1.9  --   PHOS  --   --   --   --   --   --  5.4* 2.6   GFR: Estimated Creatinine Clearance: 67.8 mL/min (by C-G formula based on SCr of 0.46 mg/dL). Liver Function Tests: Recent Labs  Lab 06/30/20 0111 07/01/20 0634  AST 37 42*  ALT 37 41  ALKPHOS 74 75  BILITOT 0.7 0.6  PROT 6.1* 6.3*  ALBUMIN 2.1* 2.2*   No results for input(s): LIPASE, AMYLASE in the last 168 hours. No results for input(s): AMMONIA in the last 168 hours. Coagulation Profile: No results for  input(s): INR, PROTIME in the last 168 hours. Cardiac Enzymes: No results for input(s): CKTOTAL, CKMB, CKMBINDEX, TROPONINI in the last 168 hours. BNP (last 3 results) No results for input(s): PROBNP in the last 8760 hours. HbA1C: No results for input(s): HGBA1C in the last 72 hours. CBG: Recent Labs  Lab 07/05/20 1504 07/05/20 1906 07/05/20 2303 07/06/20 0259 07/06/20  0745  GLUCAP 128* 129* 107* 100* 73   Lipid Profile: No results for input(s): CHOL, HDL, LDLCALC, TRIG, CHOLHDL, LDLDIRECT in the last 72 hours. Thyroid Function Tests: No results for input(s): TSH, T4TOTAL, FREET4, T3FREE, THYROIDAB in the last 72 hours. Anemia Panel: No results for input(s): VITAMINB12, FOLATE, FERRITIN, TIBC, IRON, RETICCTPCT in the last 72 hours. Sepsis Labs: Recent Labs  Lab 07/03/20 1628  LATICACIDVEN 0.9    Recent Results (from the past 240 hour(s))  Expectorated Sputum Assessment w Gram Stain, Rflx to Resp Cult     Status: None   Collection Time: 07/03/20  3:49 PM   Specimen: Expectorated Sputum  Result Value Ref Range Status   Specimen Description EXPECTORATED SPUTUM  Final   Special Requests NONE  Final   Sputum evaluation   Final    THIS SPECIMEN IS ACCEPTABLE FOR SPUTUM CULTURE Performed at [facility], [place] [address] [place], [place] [address]    Report Status 07/04/2020 FINAL  Final  Culture, Respiratory w Gram Stain     Status: None   Collection Time: 07/03/20  3:49 PM  Result Value Ref Range Status   Specimen Description EXPECTORATED SPUTUM  Final   Special Requests NONE Reflexed from [id number]  Final   Gram Stain   Final    MODERATE WBC PRESENT, PREDOMINANTLY PMN FEW GRAM POSITIVE RODS    Culture   Final    MODERATE Normal respiratory flora-no Staph aureus or Pseudomonas seen Performed at [facility], [place] [address], [place], [place] [address]    Report Status 07/06/2020 FINAL  Final  Blood culture (routine x 2)     Status: None (Preliminary result)    Collection Time: 07/03/20  4:28 PM   Specimen: BLOOD RIGHT ARM  Result Value Ref Range Status   Specimen Description BLOOD RIGHT ARM  Final   Special Requests   Final    BOTTLES DRAWN AEROBIC AND ANAEROBIC Blood Culture adequate volume   Culture   Final    NO GROWTH 3 DAYS Performed at [facility], [place] [address] [place], [place] [address]    Report Status PENDING  Incomplete  Resp Panel by RT-PCR (Flu A&B, Covid) Nasopharyngeal Swab     Status: None   Collection Time: 07/03/20  4:30 PM   Specimen: Nasopharyngeal Swab; Nasopharyngeal(NP) swabs in vial transport medium  Result Value Ref Range Status   SARS Coronavirus 2 by RT PCR NEGATIVE NEGATIVE Final    Comment: (NOTE) SARS-CoV-2 target nucleic acids are NOT DETECTED.  The SARS-CoV-2 RNA is generally detectable in upper respiratory specimens during the acute phase of infection. The lowest concentration of SARS-CoV-2 viral copies this assay can detect is 138 copies/mL. A negative result does not preclude SARS-Cov-2 infection and should not be used as the sole basis for treatment or other patient management decisions. A negative result may occur with  improper specimen collection/handling, submission of specimen other than nasopharyngeal swab, presence of viral mutation(s) within the areas targeted by this assay, and inadequate number of viral copies(<138 copies/mL). A negative result must be combined with clinical observations, patient history, and epidemiological information. The expected result is Negative.  Fact Sheet for Patients:  EntrepreneurPulse.com.au  Fact Sheet for Healthcare Providers:  IncredibleEmployment.be  This test is no t yet approved or cleared by the [place] FDA and  has been authorized for detection and/or diagnosis of SARS-CoV-2 by FDA under an Emergency Use Authorization (EUA). This EUA will remain  in effect (meaning this test can be used) for the  duration of the COVID-19 declaration under Section 564(b)(1) of the Act, 21 U.S.C.section 360bbb-3(b)(1), unless the authorization is terminated  or revoked sooner.       Influenza A by PCR NEGATIVE NEGATIVE Final   Influenza B by PCR NEGATIVE NEGATIVE Final    Comment: (NOTE) The Xpert Xpress SARS-CoV-2/FLU/RSV plus assay is intended as an aid in the diagnosis of influenza from Nasopharyngeal swab specimens and should not be used as a sole basis for treatment. Nasal washings and aspirates are unacceptable for Xpert Xpress SARS-CoV-2/FLU/RSV testing.  Fact Sheet for Patients: EntrepreneurPulse.com.au  Fact Sheet for Healthcare Providers: IncredibleEmployment.be  This test is not yet approved or cleared by the [place] FDA and has been authorized for detection and/or diagnosis of SARS-CoV-2 by FDA under an Emergency Use Authorization (EUA). This EUA will remain in effect (meaning this test can be used) for the duration of the COVID-19 declaration under Section 564(b)(1) of the Act, 21 U.S.C. section 360bbb-3(b)(1), unless the authorization is terminated or revoked.  Performed at [facility], [place] [address] [place], [place] [address]   MRSA PCR Screening     Status: None   Collection Time: 07/03/20  7:52 PM   Specimen: Nasopharyngeal  Result Value Ref Range Status   MRSA by PCR NEGATIVE NEGATIVE Final    Comment:        The GeneXpert MRSA Assay (FDA approved for NASAL specimens only), is one component of a comprehensive MRSA colonization surveillance program. It is not intended to diagnose MRSA infection nor to guide or monitor treatment for MRSA infections. Performed at [facility], [place] [address], [place], [place] [address]   Blood culture (routine x 2)     Status: None (Preliminary result)   Collection Time: 07/03/20  8:26 PM   Specimen: BLOOD RIGHT FOREARM  Result Value Ref Range Status   Specimen  Description BLOOD RIGHT FOREARM  Final   Special Requests   Final    BOTTLES DRAWN AEROBIC ONLY Blood Culture results may not be optimal due to an inadequate volume of blood received in culture bottles   Culture   Final    NO GROWTH 3 DAYS Performed at [facility], [place] [address] [place], [place] [address]    Report Status PENDING  Incomplete         Radiology Studies: No results found.      Scheduled Meds: . apixaban  5 mg Per Tube BID  . arformoterol  15 mcg Nebulization BID  . budesonide  0.25 mg Nebulization BID  . chlorhexidine gluconate (MEDLINE KIT)  15 mL Mouth Rinse BID  . Chlorhexidine Gluconate Cloth  6 each Topical Daily  . clonazePAM  1 mg Per Tube TID  . insulin aspart  2-6 Units Subcutaneous Q4H  . levothyroxine  50 mcg Oral Q0600  . mouth rinse  15 mL Mouth Rinse 10 times per day  . multivitamin with minerals  1 tablet Oral Daily  . pantoprazole sodium  40 mg Per Tube QHS  . polyethylene glycol  17 g Per Tube BID  . revefenacin  175 mcg Nebulization Daily  . rosuvastatin  40 mg Per Tube Daily  . temazepam  15 mg Per Tube QHS  . venlafaxine  37.5 mg Per Tube BID WC   Continuous Infusions:   LOS: 3 days    Time spent: 35 minutes    Barb Merino, MD  [facility] Pager [phone]

## 2020-07-06 NOTE — Plan of Care (Signed)
  Problem: Activity: Goal: Ability to tolerate increased activity will improve Outcome: Progressing   Problem: Respiratory: Goal: Ability to maintain a clear airway and adequate ventilation will improve Outcome: Progressing   Problem: Role Relationship: Goal: Method of communication will improve Outcome: Progressing   

## 2020-07-07 LAB — TYPE AND SCREEN
ABO/RH(D): O POS
Antibody Screen: NEGATIVE
Unit division: 0

## 2020-07-07 LAB — BPAM RBC
Blood Product Expiration Date: 202204212359
ISSUE DATE / TIME: 202203181842
Unit Type and Rh: 5100

## 2020-07-07 MED ORDER — DOCUSATE SODIUM 50 MG/5ML PO LIQD: 100.0000 mg | Freq: Two times a day (BID) | ORAL | Status: DC | PRN

## 2020-07-07 MED ORDER — LEVOTHYROXINE SODIUM 25 MCG PO TABS
50.0000 ug | ORAL_TABLET | Freq: Every day | ORAL | Status: DC
  Administered 2020-07-08 – 2020-07-09 (×2): 50 ug
  Filled 2020-07-07 (×2): qty 2

## 2020-07-07 MED ORDER — ADULT MULTIVITAMIN W/MINERALS CH
1.0000 | ORAL_TABLET | Freq: Every day | ORAL | Status: DC
  Administered 2020-07-07 – 2020-07-09 (×3): 1 via ORAL
  Filled 2020-07-07 (×3): qty 1

## 2020-07-07 MED ORDER — ADULT MULTIVITAMIN W/MINERALS CH: 1.0000 | ORAL_TABLET | Freq: Every day | ORAL | Status: DC

## 2020-07-07 NOTE — Plan of Care (Signed)

## 2020-07-07 NOTE — Progress Notes (Signed)
PROGRESS NOTE    Kathleen Cameron  ZOX:[id number] DOB: [birth year] DOA: 07/03/2020 PCP: Horald Pollen, MD    Brief Narrative:  55 year old female with history of COPD, chronic hypoxemic respiratory failure on home ventilator via tracheostomy placed in November 2021 after COPD exacerbation and failure to liberate from the ventilator, admission to [facility] and subsequently discharged home admitted to the intensive care unit on 3/1:15 day of discharge with more shortness of breath and asking for suctioning.  She continued to desaturate so husband called EMS and patient was admitted to the intensive care unit, treated with suctioning.  Reportedly she was saturating 50% on ventilator and with poor mentation. 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 3/18, patient ultimately stabilized on her home Trilogy system and transferred to general medical bed.   Assessment & Plan:   Active Problems:   Pressure injury of skin   Acute on chronic respiratory failure with hypercapnia (HCC)  Acute on chronic respiratory failure with hypoxia and hypercapnia: Treated with antibiotics for 2 days, now stabilized on home settings Trelegy machine.  Waiting for arrangements at home to go home. Antibiotics discontinued. Continue trach care, suctioning and pulmonary hygiene.  Anemia of chronic disease: Chronic GI blood loss. Baseline hemoglobin about 8.5-9.  History of angiodysplasia, EGD within 2 weeks.  No evidence of ongoing bleeding.  Hemoglobin more than 7.   1 unit PRBC on 3/18.  Recheck tomorrow morning to ensure stabilization.  Continue Eliquis for now.  On PPI.  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  Discontinue aspirin, there is no benefit of combination. If concern continues  to have bleeding, will discontinue Eliquis, this was provoked subclavian small DVT as per reports.  COPD severe: Currently on vent and Pulmicort/Brovana, nebulizers.  Hypertension: Blood pressures low normal.  Discontinue amlodipine.  Seizure disorder: Patient is on long-term Klonopin that is continued.  Hypothyroidism: On Synthroid 50 mcg daily.  Continue.  Diet: Regular diet by mouth.  Medications by PEG tube.  Regular tube feeding.  Replace electrolytes. MiraLAX as scheduled.  Aggressive bowel regimen today.   DVT prophylaxis:  apixaban (ELIQUIS) tablet 5 mg   Code Status: Full code Family Communication: None at the bedside, husband on the phone 3/18. Disposition Plan: Status is: Inpatient  Remains inpatient appropriate because:Unsafe d/c plan   Dispo: The patient is from: Home              Anticipated d/c is to: Home              Patient currently is medically stable to d/c.   Difficult to place patient No         Consultants:   PCCM  Procedures:   None  Antimicrobials:   Vancomycin and cefepime 3/15-3/16 discontinued.   Subjective: Patient seen and examined.  No overnight events.  She denies any complaints.  She had 2 small maroonish stool since MiraLAX yesterday morning, she thinks it is not completely evacuated yet.  Belly pain is improved. Objective: Vitals:   07/07/20 0749 07/07/20 0800 07/07/20 0900 07/07/20 1000  BP:  115/81 117/68 124/80  Pulse:  93 82 90  Resp:  _0 Temp: 98.7 F (37.1 C)     TempSrc: Oral     SpO2: 97% 99% 97% 98%  Weight:  Intake/Output Summary (Last 24 hours) at 07/07/2020 1030 Last data filed at 07/07/2020 0800 Gross per 24 hour  Intake 1035 ml  Output 200 ml  Net 835 ml   Filed Weights   07/04/20 0500 07/05/20 0320 07/07/20 0500  Weight: 58.1 kg 58 kg 59.7 kg    Examination:  General exam: Chronically sick looking and debilitated lady comfortably lying in bed on tracheostomy and ventilator  support.  Pleasant to conversation even though she is not able to speak. She has nonpitting edema both arms, nontender. Respiratory system: Some conducted airway sounds.  Trach in place.  Looks clean and dry. Cardiovascular system: S1 & S2 heard, RRR.  Trace bilateral pedal edema. Gastrointestinal system: Abdomen is soft.  Mildly distended.  PEG tube in place.  Bowel sounds present. Central nervous system: Alert and oriented. No focal neurological deficits. Extremities: Symmetric 5 x 5 power.  Generalized weakness.   Data Reviewed: I have personally reviewed following labs and imaging studies  CBC: Recent Labs  Lab 07/01/20 0634 07/03/20 1453 07/03/20 1648 07/04/20 0733 07/05/20 0908 07/05/20 1124 07/06/20 0446 07/06/20 1551 07/06/20 2315  WBC 9.5 29.1*  --  12.1* 10.1 9.9 10.4  --   --   NEUTROABS 6.4 25.6*  --   --   --   --  7.7  --   --   HGB 7.9* 8.9*   < > 7.9* 6.8* 7.2* 7.0* 6.7* 7.6*  HCT 25.2* 29.7*   < > 25.9* 22.3* 23.5* 23.1* 21.9* 24.0*  MCV 91.0 94.0  --  90.6 90.7 91.1 90.6  --   --   PLT 293 507*  --  378 278 299 276  --   --    < > = values in this interval not displayed.   Basic Metabolic Panel: Recent Labs  Lab 07/01/20 0634 07/03/20 1453 07/03/20 1648 07/03/20 2003 07/03/20 2130 07/04/20 0733 07/06/20 0446  NA 138 138 136 139 139 137 139  K 4.0 4.1 3.8 4.1 4.1 4.0 3.4*  CL 99 98  --   --   --  97* 100  CO2 34* 31  --   --   --  28 33*  GLUCOSE 84 152*  --   --   --  80 100*  BUN 17 17  --   --   --  24* 13  CREATININE 0.42* 0.67  --   --   --  0.68 0.46  CALCIUM 9.2 10.2  --   --   --  9.3 8.9  MG  --   --   --   --   --  1.9  --   PHOS  --   --   --   --   --  5.4* 2.6   GFR: Estimated Creatinine Clearance: 67.8 mL/min (by C-G formula based on SCr of 0.46 mg/dL). Liver Function Tests: Recent Labs  Lab 07/01/20 0634  AST 42*  ALT 41  ALKPHOS 75  BILITOT 0.6  PROT 6.3*  ALBUMIN 2.2*   No results for input(s): LIPASE, AMYLASE in the  last 168 hours. No results for input(s): AMMONIA in the last 168 hours. Coagulation Profile: No results for input(s): INR, PROTIME in the last 168 hours. Cardiac Enzymes: No results for input(s): CKTOTAL, CKMB, CKMBINDEX, TROPONINI in the last 168 hours. BNP (last 3 results) No results for input(s): PROBNP in the last 8760 hours. HbA1C: No results for input(s): HGBA1C in the last 72 hours. CBG: Recent Labs  Lab 07/05/20 2303 07/06/20 0259 07/06/20 0745 07/06/20 1148 07/06/20 1552  GLUCAP 107* 100* 73 75 97   Lipid Profile: No results for input(s): CHOL, HDL, LDLCALC, TRIG, CHOLHDL, LDLDIRECT in the last 72 hours. Thyroid Function Tests: No results for input(s): TSH, T4TOTAL, FREET4, T3FREE, THYROIDAB in the last 72 hours. Anemia Panel: No results for input(s): VITAMINB12, FOLATE, FERRITIN, TIBC, IRON, RETICCTPCT in the last 72 hours. Sepsis Labs: Recent Labs  Lab 07/03/20 1628  LATICACIDVEN 0.9    Recent Results (from the past 240 hour(s))  Expectorated Sputum Assessment w Gram Stain, Rflx to Resp Cult     Status: None   Collection Time: 07/03/20  3:49 PM   Specimen: Expectorated Sputum  Result Value Ref Range Status   Specimen Description EXPECTORATED SPUTUM  Final   Special Requests NONE  Final   Sputum evaluation   Final    THIS SPECIMEN IS ACCEPTABLE FOR SPUTUM CULTURE Performed at [facility], [place] [address], [place], [place] [address]    Report Status 07/04/2020 FINAL  Final  Culture, Respiratory w Gram Stain     Status: None   Collection Time: 07/03/20  3:49 PM  Result Value Ref Range Status   Specimen Description EXPECTORATED SPUTUM  Final   Special Requests NONE Reflexed from T21111  Final   Gram Stain   Final    MODERATE WBC PRESENT, PREDOMINANTLY PMN FEW GRAM POSITIVE RODS    Culture   Final    MODERATE Normal respiratory flora-no Staph aureus or Pseudomonas seen Performed at [facility], [place] [address], [place], [place] [address]     Report Status 07/06/2020 FINAL  Final  Blood culture (routine x 2)     Status: None (Preliminary result)   Collection Time: 07/03/20  4:28 PM   Specimen: BLOOD RIGHT ARM  Result Value Ref Range Status   Specimen Description BLOOD RIGHT ARM  Final   Special Requests   Final    BOTTLES DRAWN AEROBIC AND ANAEROBIC Blood Culture adequate volume   Culture   Final    NO GROWTH 3 DAYS Performed at [facility], [place] [address], [place], [place] [address]    Report Status PENDING  Incomplete  Resp Panel by RT-PCR (Flu A&B, Covid) Nasopharyngeal Swab     Status: None   Collection Time: 07/03/20  4:30 PM   Specimen: Nasopharyngeal Swab; Nasopharyngeal(NP) swabs in vial transport medium  Result Value Ref Range Status   SARS Coronavirus 2 by RT PCR NEGATIVE NEGATIVE Final    Comment: (NOTE) SARS-CoV-2 target nucleic acids are NOT DETECTED.  The SARS-CoV-2 RNA is generally detectable in upper respiratory specimens during the acute phase of infection. The lowest concentration of SARS-CoV-2 viral copies this assay can detect is 138 copies/mL. A negative result does not preclude SARS-Cov-2 infection and should not be used as the sole basis for treatment or other patient management decisions. A negative result may occur with  improper specimen collection/handling, submission of specimen other than nasopharyngeal swab, presence of viral mutation(s) within the areas targeted by this assay, and inadequate number of viral copies(<138 copies/mL). A negative result must be combined with clinical observations, patient history, and epidemiological information. The expected result is Negative.  Fact Sheet for Patients:  EntrepreneurPulse.com.au  Fact Sheet for Healthcare Providers:  IncredibleEmployment.be  This test is no t yet approved or cleared by the [place] FDA and  has been authorized for detection and/or diagnosis of SARS-CoV-2 by FDA under  an  Emergency Use Authorization (EUA). This EUA will remain  in effect (meaning this test can be used) for the duration of the COVID-19 declaration under Section 564(b)(1) of the Act, 21 U.S.C.section 360bbb-3(b)(1), unless the authorization is terminated  or revoked sooner.       Influenza A by PCR NEGATIVE NEGATIVE Final   Influenza B by PCR NEGATIVE NEGATIVE Final    Comment: (NOTE) The Xpert Xpress SARS-CoV-2/FLU/RSV plus assay is intended as an aid in the diagnosis of influenza from Nasopharyngeal swab specimens and should not be used as a sole basis for treatment. Nasal washings and aspirates are unacceptable for Xpert Xpress SARS-CoV-2/FLU/RSV testing.  Fact Sheet for Patients: EntrepreneurPulse.com.au  Fact Sheet for Healthcare Providers: IncredibleEmployment.be  This test is not yet approved or cleared by the [place] FDA and has been authorized for detection and/or diagnosis of SARS-CoV-2 by FDA under an Emergency Use Authorization (EUA). This EUA will remain in effect (meaning this test can be used) for the duration of the COVID-19 declaration under Section 564(b)(1) of the Act, 21 U.S.C. section 360bbb-3(b)(1), unless the authorization is terminated or revoked.  Performed at [facility], [place] [address], [place], [place] [address]   MRSA PCR Screening     Status: None   Collection Time: 07/03/20  7:52 PM   Specimen: Nasopharyngeal  Result Value Ref Range Status   MRSA by PCR NEGATIVE NEGATIVE Final    Comment:        The GeneXpert MRSA Assay (FDA approved for NASAL specimens only), is one component of a comprehensive MRSA colonization surveillance program. It is not intended to diagnose MRSA infection nor to guide or monitor treatment for MRSA infections. Performed at [facility], [place] [address], [place], [place] [address]   Blood culture (routine x 2)     Status: None (Preliminary result)   Collection  Time: 07/03/20  8:26 PM   Specimen: BLOOD RIGHT FOREARM  Result Value Ref Range Status   Specimen Description BLOOD RIGHT FOREARM  Final   Special Requests   Final    BOTTLES DRAWN AEROBIC ONLY Blood Culture results may not be optimal due to an inadequate volume of blood received in culture bottles   Culture   Final    NO GROWTH 3 DAYS Performed at [facility], [place] [address], [place], [place] [address]    Report Status PENDING  Incomplete         Radiology Studies: No results found.      Scheduled Meds: . apixaban  5 mg Per Tube BID  . arformoterol  15 mcg Nebulization BID  . budesonide  0.25 mg Nebulization BID  . chlorhexidine gluconate (MEDLINE KIT)  15 mL Mouth Rinse BID  . Chlorhexidine Gluconate Cloth  6 each Topical Daily  . clonazePAM  1 mg Per Tube TID  . [START ON 07/08/2020] levothyroxine  50 mcg Per Tube Q0600  . mouth rinse  15 mL Mouth Rinse 10 times per day  . multivitamin with minerals  1 tablet Oral Daily  . pantoprazole sodium  40 mg Per Tube QHS  . polyethylene glycol  17 g Per Tube BID  . revefenacin  175 mcg Nebulization Daily  . rosuvastatin  40 mg Per Tube Daily  . temazepam  15 mg Per Tube QHS  . venlafaxine  37.5 mg Per Tube BID WC   Continuous Infusions:   LOS: 4 days    Time spent: 30 minutes  Barb Merino, MD [facility] Pager [phone]

## 2020-07-08 LAB — CBC WITH DIFFERENTIAL/PLATELET
Abs Immature Granulocytes: 0.05 10*3/uL (ref 0.00–0.07)
Basophils Absolute: 0.1 10*3/uL (ref 0.0–0.1)
Basophils Relative: 1 %
Eosinophils Absolute: 0.5 10*3/uL (ref 0.0–0.5)
Eosinophils Relative: 5 %
HCT: 26.7 % — ABNORMAL LOW (ref 36.0–46.0)
Hemoglobin: 8.3 g/dL — ABNORMAL LOW (ref 12.0–15.0)
Immature Granulocytes: 1 %
Lymphocytes Relative: 21 %
Lymphs Abs: 2 10*3/uL (ref 0.7–4.0)
MCH: 27.9 pg (ref 26.0–34.0)
MCHC: 31.1 g/dL (ref 30.0–36.0)
MCV: 89.6 fL (ref 80.0–100.0)
Monocytes Absolute: 0.5 10*3/uL (ref 0.1–1.0)
Monocytes Relative: 6 %
Neutro Abs: 6.5 10*3/uL (ref 1.7–7.7)
Neutrophils Relative %: 66 %
Platelets: 285 10*3/uL (ref 150–400)
RBC: 2.98 MIL/uL — ABNORMAL LOW (ref 3.87–5.11)
RDW: 16.6 % — ABNORMAL HIGH (ref 11.5–15.5)
WBC: 9.5 10*3/uL (ref 4.0–10.5)
nRBC: 0 % (ref 0.0–0.2)

## 2020-07-08 LAB — BASIC METABOLIC PANEL
Anion gap: 9 (ref 5–15)
BUN: 9 mg/dL (ref 6–20)
CO2: 30 mmol/L (ref 22–32)
Calcium: 9.2 mg/dL (ref 8.9–10.3)
Chloride: 98 mmol/L (ref 98–111)
Creatinine, Ser: 0.45 mg/dL (ref 0.44–1.00)
GFR, Estimated: >60 mL/min (ref >60)
Glucose, Bld: 91 mg/dL (ref 70–99)
Potassium: 3.8 mmol/L (ref 3.5–5.1)
Sodium: 137 mmol/L (ref 135–145)

## 2020-07-08 LAB — MAGNESIUM: Magnesium: 1.7 mg/dL (ref 1.7–2.4)

## 2020-07-08 LAB — PHOSPHORUS: Phosphorus: 3.8 mg/dL (ref 2.5–4.6)

## 2020-07-08 MED ORDER — MAGNESIUM CITRATE PO SOLN
1.0000 | Freq: Once | ORAL | Status: AC
  Administered 2020-07-08: 1
  Filled 2020-07-08: qty 296

## 2020-07-08 MED ORDER — MAGNESIUM SULFATE 2 GM/50ML IV SOLN
2.0000 g | Freq: Once | INTRAVENOUS | Status: AC
  Administered 2020-07-08: 2 g via INTRAVENOUS
  Filled 2020-07-08: qty 50

## 2020-07-08 NOTE — Progress Notes (Signed)
Mg 1.7 Electrolytes replaced per protocol 

## 2020-07-08 NOTE — Plan of Care (Signed)

## 2020-07-08 NOTE — Progress Notes (Signed)
PROGRESS NOTE    Kathleen Cameron  UYE:[id number] DOB: [birth year] DOA: 07/03/2020 PCP: Horald Pollen, MD    Brief Narrative:  55 year old female with history of COPD, chronic hypoxemic respiratory failure on home ventilator via tracheostomy placed in November 2021 after COPD exacerbation and failure to liberate from the ventilator, admission to [facility] and subsequently discharged home admitted to the intensive care unit on 3/1:15 day of discharge with more shortness of breath and asking for suctioning.  She continued to desaturate so husband called EMS and patient was admitted to the intensive care unit, treated with suctioning.  Reportedly she was saturating 50% on ventilator and with poor mentation. 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 3/18, patient ultimately stabilized on her home Trilogy system and transferred to general medical bed.   Assessment & Plan:   Active Problems:   Pressure injury of skin   Acute on chronic respiratory failure with hypercapnia (HCC)  Acute on chronic respiratory failure with hypoxia and hypercapnia: Treated with antibiotics for 2 days, now stabilized on home settings Trelegy machine.  Waiting for arrangements at home to go home. Antibiotics discontinued. Continue trach care, suctioning and pulmonary hygiene.  Anemia of chronic disease: Chronic GI blood loss. Baseline hemoglobin about 8.5-9.  History of angiodysplasia, EGD within 2 weeks.  No evidence of ongoing bleeding. 1 unit PRBC on 3/18.  Appropriately responded.  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  Discontinue aspirin, there is no benefit of combination. If concern continues to have bleeding, will discontinue Eliquis, this was provoked subclavian small DVT as  per reports.  COPD severe: Currently on vent and Pulmicort/Brovana, nebulizers.  Hypertension: Blood pressures low normal.  Discontinue amlodipine.  Seizure disorder: Patient is on long-term Klonopin that is continued.  Hypothyroidism: On Synthroid 50 mcg daily.  Continue.  Diet: Regular diet by mouth.  Medications by PEG tube.  Regular tube feeding.  Replace electrolytes. MiraLAX as scheduled.   No effective bowel movement yet.  Added magnesium citrate.   DVT prophylaxis:  apixaban (ELIQUIS) tablet 5 mg   Code Status: Full code Family Communication: None today. Disposition Plan: Status is: Inpatient  Remains inpatient appropriate because:Unsafe d/c plan   Dispo: The patient is from: Home              Anticipated d/c is to: Home              Patient currently is medically stable to d/c.   Difficult to place patient No         Consultants:   PCCM  Procedures:   None  Antimicrobials:   Vancomycin and cefepime 3/15-3/16 discontinued.   Subjective: Patient seen and examined.  She had 1 more bowel movement yesterday afternoon but not much.  Still feels some diffuse abdominal pain.  She thinks she needs more laxatives to open up.  No other overnight events.  Objective: Vitals:   07/08/20 0739 07/08/20 0800 07/08/20 0900 07/08/20 1000  BP:  115/79 122/90 119/80  Pulse:  76 89 79  Resp:  _0 Temp:  98 F (36.7 C)    TempSrc:  Oral    SpO2: 99% 100% 100% 98%  Weight:        Intake/Output Summary (Last 24 hours) at 07/08/2020 1026 Last data filed at 07/08/2020  0800 Gross per 24 hour  Intake 700.14 ml  Output 400 ml  Net 300.14 ml   Filed Weights   07/05/20 0320 07/07/20 0500 07/08/20 0355  Weight: 58 kg 59.7 kg 60.4 kg    Examination:  General exam: Chronically sick looking and debilitated lady comfortably lying in bed on tracheostomy and ventilator support.  Pleasant to conversation even though she is not able to speak. She has nonpitting  edema both arms, nontender. Respiratory system: Some conducted airway sounds.  Trach in place.  Looks clean and dry. Cardiovascular system: S1 & S2 heard, RRR.  Trace bilateral pedal edema. Gastrointestinal system: Abdomen is soft.  Mild tenderness along the epigastrium. Central nervous system: Alert and oriented. No focal neurological deficits. Extremities: Symmetric 5 x 5 power.  Generalized weakness.   Data Reviewed: I have personally reviewed following labs and imaging studies  CBC: Recent Labs  Lab 07/03/20 1453 07/03/20 1648 07/04/20 0733 07/05/20 0908 07/05/20 1124 07/06/20 0446 07/06/20 1551 07/06/20 2315 07/08/20 0230  WBC 29.1*  --  12.1* 10.1 9.9 10.4  --   --  9.5  NEUTROABS 25.6*  --   --   --   --  7.7  --   --  6.5  HGB 8.9*   < > 7.9* 6.8* 7.2* 7.0* 6.7* 7.6* 8.3*  HCT 29.7*   < > 25.9* 22.3* 23.5* 23.1* 21.9* 24.0* 26.7*  MCV 94.0  --  90.6 90.7 91.1 90.6  --   --  89.6  PLT 507*  --  378 278 299 276  --   --  285   < > = values in this interval not displayed.   Basic Metabolic Panel: Recent Labs  Lab 07/03/20 1453 07/03/20 1648 07/03/20 2003 07/03/20 2130 07/04/20 0733 07/06/20 0446 07/08/20 0230  NA 138   < > 139 139 137 139 137  K 4.1   < > 4.1 4.1 4.0 3.4* 3.8  CL 98  --   --   --  97* 100 98  CO2 31  --   --   --  28 33* 30  GLUCOSE 152*  --   --   --  80 100* 91  BUN 17  --   --   --  24* 13 9  CREATININE 0.67  --   --   --  0.68 0.46 0.45  CALCIUM 10.2  --   --   --  9.3 8.9 9.2  MG  --   --   --   --  1.9  --  1.7  PHOS  --   --   --   --  5.4* 2.6 3.8   < > = values in this interval not displayed.   GFR: Estimated Creatinine Clearance: 67.8 mL/min (by C-G formula based on SCr of 0.45 mg/dL). Liver Function Tests: No results for input(s): AST, ALT, ALKPHOS, BILITOT, PROT, ALBUMIN in the last 168 hours. No results for input(s): LIPASE, AMYLASE in the last 168 hours. No results for input(s): AMMONIA in the last 168 hours. Coagulation  Profile: No results for input(s): INR, PROTIME in the last 168 hours. Cardiac Enzymes: No results for input(s): CKTOTAL, CKMB, CKMBINDEX, TROPONINI in the last 168 hours. BNP (last 3 results) No results for input(s): PROBNP in the last 8760 hours. HbA1C: No results for input(s): HGBA1C in the last 72 hours. CBG: Recent Labs  Lab 07/05/20 2303 07/06/20 0259 07/06/20 0745 07/06/20 1148 07/06/20 1552  GLUCAP 107* 100* 73  75 97   Lipid Profile: No results for input(s): CHOL, HDL, LDLCALC, TRIG, CHOLHDL, LDLDIRECT in the last 72 hours. Thyroid Function Tests: No results for input(s): TSH, T4TOTAL, FREET4, T3FREE, THYROIDAB in the last 72 hours. Anemia Panel: No results for input(s): VITAMINB12, FOLATE, FERRITIN, TIBC, IRON, RETICCTPCT in the last 72 hours. Sepsis Labs: Recent Labs  Lab 07/03/20 1628  LATICACIDVEN 0.9    Recent Results (from the past 240 hour(s))  Expectorated Sputum Assessment w Gram Stain, Rflx to Resp Cult     Status: None   Collection Time: 07/03/20  3:49 PM   Specimen: Expectorated Sputum  Result Value Ref Range Status   Specimen Description EXPECTORATED SPUTUM  Final   Special Requests NONE  Final   Sputum evaluation   Final    THIS SPECIMEN IS ACCEPTABLE FOR SPUTUM CULTURE Performed at [facility], [place] [address], [place],  [place] [address]    Report Status 07/04/2020 FINAL  Final  Culture, Respiratory w Gram Stain     Status: None   Collection Time: 07/03/20  3:49 PM  Result Value Ref Range Status   Specimen Description EXPECTORATED SPUTUM  Final   Special Requests NONE Reflexed from [id number]  Final   Gram Stain   Final    MODERATE WBC PRESENT, PREDOMINANTLY PMN FEW GRAM POSITIVE RODS    Culture   Final    MODERATE Normal respiratory flora-no Staph aureus or Pseudomonas seen Performed at [facility], [place] [address] [place], [place] [address]    Report Status 07/06/2020 FINAL  Final  Blood culture (routine x 2)     Status: None  (Preliminary result)   Collection Time: 07/03/20  4:28 PM   Specimen: BLOOD RIGHT ARM  Result Value Ref Range Status   Specimen Description BLOOD RIGHT ARM  Final   Special Requests   Final    BOTTLES DRAWN AEROBIC AND ANAEROBIC Blood Culture adequate volume   Culture   Final    NO GROWTH 4 DAYS Performed at [facility], [place] [address] [place], [place] [address]    Report Status PENDING  Incomplete  Resp Panel by RT-PCR (Flu A&B, Covid) Nasopharyngeal Swab     Status: None   Collection Time: 07/03/20  4:30 PM   Specimen: Nasopharyngeal Swab; Nasopharyngeal(NP) swabs in vial transport medium  Result Value Ref Range Status   SARS Coronavirus 2 by RT PCR NEGATIVE NEGATIVE Final    Comment: (NOTE) SARS-CoV-2 target nucleic acids are NOT DETECTED.  The SARS-CoV-2 RNA is generally detectable in upper respiratory specimens during the acute phase of infection. The lowest concentration of SARS-CoV-2 viral copies this assay can detect is 138 copies/mL. A negative result does not preclude SARS-Cov-2 infection and should not be used as the sole basis for treatment or other patient management decisions. A negative result may occur with  improper specimen collection/handling, submission of specimen other than nasopharyngeal swab, presence of viral mutation(s) within the areas targeted by this assay, and inadequate number of viral copies(<138 copies/mL). A negative result must be combined with clinical observations, patient history, and epidemiological information. The expected result is Negative.  Fact Sheet for Patients:  EntrepreneurPulse.com.au  Fact Sheet for Healthcare Providers:  IncredibleEmployment.be  This test is no t yet approved or cleared by the [place] FDA and  has been authorized for detection and/or diagnosis of SARS-CoV-2 by FDA under an Emergency Use Authorization (EUA). This EUA will remain  in effect (meaning this test  can be used) for the duration of the COVID-19 declaration under Section 564(b)(1) of the Act, 21 U.S.C.section 360bbb-3(b)(1), unless the authorization is terminated  or revoked sooner.       Influenza A by PCR NEGATIVE NEGATIVE Final   Influenza B by PCR NEGATIVE NEGATIVE Final    Comment: (NOTE) The Xpert Xpress SARS-CoV-2/FLU/RSV plus assay is intended as an aid in the diagnosis of influenza from Nasopharyngeal swab specimens and should not be used as a sole basis for treatment. Nasal washings and aspirates are unacceptable for Xpert Xpress SARS-CoV-2/FLU/RSV testing.  Fact Sheet for Patients: EntrepreneurPulse.com.au  Fact Sheet for Healthcare Providers: IncredibleEmployment.be  This test is not yet approved or cleared by the [place] FDA and has been authorized for detection and/or diagnosis of SARS-CoV-2 by FDA under an Emergency Use Authorization (EUA). This EUA will remain in effect (meaning this test can be used) for the duration of the COVID-19 declaration under Section 564(b)(1) of the Act, 21 U.S.C. section 360bbb-3(b)(1), unless the authorization is terminated or revoked.  Performed at [facility], [place] [address], [place], [place] [address]   MRSA PCR Screening     Status: None   Collection Time: 07/03/20  7:52 PM   Specimen: Nasopharyngeal  Result Value Ref Range Status   MRSA by PCR NEGATIVE NEGATIVE Final    Comment:        The GeneXpert MRSA Assay (FDA approved for NASAL specimens only), is one component of a comprehensive MRSA colonization surveillance program. It is not intended to diagnose MRSA infection nor to guide or monitor treatment for MRSA infections. Performed at [facility], [place] [address], [place], [place] [address]   Blood culture (routine x 2)     Status: None (Preliminary result)   Collection Time: 07/03/20  8:26 PM   Specimen: BLOOD RIGHT FOREARM  Result Value Ref Range Status    Specimen Description BLOOD RIGHT FOREARM  Final   Special Requests   Final    BOTTLES DRAWN AEROBIC ONLY Blood Culture results may not be optimal due to an inadequate volume of blood received in culture bottles   Culture   Final    NO GROWTH 4 DAYS Performed at [facility], [place] [address], [place], [place] [address]    Report Status PENDING  Incomplete         Radiology Studies: No results found.      Scheduled Meds: . apixaban  5 mg Per Tube BID  . arformoterol  15 mcg Nebulization BID  . budesonide  0.25 mg Nebulization BID  . chlorhexidine gluconate (MEDLINE KIT)  15 mL Mouth Rinse BID  . Chlorhexidine Gluconate Cloth  6 each Topical Daily  . clonazePAM  1 mg Per Tube TID  . levothyroxine  50 mcg Per Tube Q0600  . magnesium citrate  1 Bottle Per Tube Once  . mouth rinse  15 mL Mouth Rinse 10 times per day  . multivitamin with minerals  1 tablet Oral Daily  . pantoprazole sodium  40 mg Per Tube QHS  . polyethylene glycol  17 g Per Tube BID  . revefenacin  175 mcg Nebulization Daily  . rosuvastatin  40 mg Per Tube Daily  . temazepam  15 mg Per Tube QHS  . venlafaxine  37.5 mg Per Tube BID WC   Continuous Infusions:   LOS: 5 days    Time spent: 25 minutes   Barb Merino, MD [facility] Pager [phone]

## 2020-07-09 MED ORDER — ENSURE ACTIVE HIGH PROTEIN PO LIQD: 1.0000 | Freq: Two times a day (BID) | ORAL | Status: AC

## 2020-07-09 NOTE — Discharge Summary (Signed)
Physician Discharge Summary  Kathleen Cameron QIO:[id number] DOB: 1965-10-20 DOA: 07/03/2020  PCP: Horald Pollen, MD  Admit date: 07/03/2020 Discharge date: 07/09/2020  Admitted From: Home Disposition: Home with home health.  Home vent management.  Recommendations for Outpatient Follow-up:  1. Follow up with PCP in 1-2 weeks 2. Please obtain BMP/CBC in one week   Home Health: RN Equipment/Devices: Ventilator present at home  Discharge Condition: Stable CODE STATUS: Full code Diet recommendation: Regular diet.  Medications through the PEG tube, PEG tube flushing to avoid clogging.  Discharge summary: 55 year old female with history of COPD, chronic hypoxemic respiratory failure on home ventilator via tracheostomy placed in November 2021 after COPD exacerbation and failure to liberate from the ventilator, admission to [facility] and subsequently discharged home admitted to the intensive care unit on 3/15 after 1 day of discharge with more shortness of breath and asking for suctioning.  She continued to desaturate so husband called EMS and patient was admitted to the intensive care unit, treated with suctioning.  Reportedly she was saturating 50% on ventilator and with poor mentation. 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 3/18, patient ultimately stabilized on her home Trilogy system and transferred to general medical bed.   Assessment & Plan of care:  Acute on chronic respiratory failure with hypoxia and hypercapnia: Treated with antibiotics for 2 days, now stabilized on home settings Trilegy machine.  Waiting for arrangements at home to go home. Antibiotics discontinued. Continue trach care, suctioning and pulmonary hygiene.  Optimized.  Anemia of chronic disease: Chronic GI blood loss. Baseline hemoglobin about 8.5-9.  History  of angiodysplasia, EGD within 2 weeks.  No evidence of ongoing bleeding. 1 unit PRBC on 3/18.  Appropriately responded.  Hemoglobin is stable.  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  Discontinue aspirin, there is no benefit of combination. If concern continues to have bleeding, will discontinue Eliquis, this was provoked subclavian small DVT as per reports.  COPD severe: Currently on vent and Pulmicort/Brovana, nebulizers.  Hypertension: Blood pressures low normal.  Discontinue amlodipine.  Seizure disorder: Patient is on long-term Klonopin that is continued.  Hypothyroidism: On Synthroid 50 mcg daily.  Continue.  Diet: Regular diet by mouth.  Medications by PEG tube.    Regular flushing of the PEG tube.  Patient is going home today with arrangements at home.  Very high risk for readmission given ventilator management, chances of aspiration and blockage of tracheostomy tube.   Discharge Diagnoses:  Active Problems:   Pressure injury of skin   Acute on chronic respiratory failure with hypercapnia [facility])    Discharge Instructions  Discharge Instructions    Diet general   Complete by: As directed    Increase activity slowly   Complete by: As directed    No wound care   Complete by: As directed      Allergies as of 07/09/2020      Reactions   Stiolto Respimat [tiotropium Bromide-olodaterol] Other (See Comments)   Severe headaches and rapid heartrate      Medication List    STOP taking these medications   amLODipine 5 MG tablet Commonly known as: NORVASC   aspirin 81 MG chewable tablet   oxyCODONE-acetaminophen 5-325 MG tablet Commonly known as: PERCOCET/ROXICET   prednisoLONE acetate 1 % ophthalmic suspension Commonly known as: PRED FORTE  TAKE these medications   apixaban 5 MG Tabs tablet Commonly known as: ELIQUIS Place 1 tablet (5 mg total) into feeding tube 2 (two) times  daily.   arformoterol 15 MCG/2ML Nebu Commonly known as: BROVANA Take 2 mLs (15 mcg total) by nebulization 2 (two) times daily.   bisacodyl 10 MG suppository Commonly known as: DULCOLAX Place 1 suppository (10 mg total) rectally daily as needed for severe constipation.   budesonide 0.25 MG/2ML nebulizer solution Commonly known as: PULMICORT Take 2 mLs (0.25 mg total) by nebulization 2 (two) times daily.   chlorhexidine gluconate (MEDLINE KIT) 0.12 % solution Commonly known as: PERIDEX 15 mLs by Mouth Rinse route 2 (two) times daily.   Chlorhexidine Gluconate Cloth 2 % Pads Apply 6 each topically daily.   clonazePAM 1 MG tablet Commonly known as: KLONOPIN Place 1 tablet (1 mg total) into feeding tube 3 (three) times daily.   feeding supplement (OSMOLITE 1.2 CAL) Liqd Place 1,000 mLs into feeding tube continuous.   levothyroxine 50 MCG tablet Commonly known as: SYNTHROID Take 1 tablet (50 mcg total) by mouth daily at 6 (six) AM.   lip balm ointment Apply topically as needed for lip care.   multivitamin with minerals Tabs tablet Place 1 tablet into feeding tube daily.   pantoprazole sodium 40 mg/20 mL Pack Commonly known as: PROTONIX Place 20 mLs (40 mg total) into feeding tube at bedtime.   phenol 1.4 % Liqd Commonly known as: CHLORASEPTIC Use as directed 1 spray in the mouth or throat as needed for throat irritation / pain.   polyethylene glycol 17 g packet Commonly known as: MIRALAX / GLYCOLAX Place 17 g into feeding tube daily as needed for mild constipation.   revefenacin 175 MCG/3ML nebulizer solution Commonly known as: YUPELRI Take 3 mLs (175 mcg total) by nebulization daily.   rosuvastatin 40 MG tablet Commonly known as: CRESTOR Place 1 tablet (40 mg total) into feeding tube daily.   senna-docusate 8.6-50 MG tablet Commonly known as: Senokot-S Place 2 tablets into feeding tube at bedtime as needed for moderate constipation.   sodium chloride 0.65 %  Soln nasal spray Commonly known as: OCEAN Place 1 spray into both nostrils as needed for congestion.   temazepam 15 MG capsule Commonly known as: RESTORIL Place 1 capsule (15 mg total) into feeding tube at bedtime.   venlafaxine 37.5 MG tablet Commonly known as: EFFEXOR Place 1 tablet (37.5 mg total) into feeding tube 2 (two) times daily with a meal.       Follow-up Information    [name], MD Follow up in 2 week(s).   Specialty: Internal Medicine Contact information: [address] [place] [address] [phone]              Allergies  Allergen Reactions  . Stiolto Respimat [Tiotropium Bromide-Olodaterol] Other (See Comments)    Severe headaches and rapid heartrate    Consultations:  PCCM   Procedures/Studies: CT ABDOMEN PELVIS WO CONTRAST  Result Date: 07/03/2020 CLINICAL DATA:  Acute nonlocalized abdominal pain EXAM: CT ABDOMEN AND PELVIS WITHOUT CONTRAST TECHNIQUE: Multidetector CT imaging of the abdomen and pelvis was performed following the standard protocol without IV contrast. COMPARISON:  02/28/2020 FINDINGS: Lower chest: Patchy bibasilar nodular airspace disease is identified, which may reflect infection or aspiration. Denser area of consolidation within the posterior basilar segment of the left lower lobe may reflect atelectasis or scarring. No effusion or pneumothorax. Unenhanced CT was performed per clinician order. Lack of IV contrast limits  sensitivity and specificity, especially for evaluation of abdominal/pelvic solid viscera. Hepatobiliary: No focal liver abnormality is seen. No gallstones, gallbladder wall thickening, or biliary dilatation. Pancreas: Unremarkable. No pancreatic ductal dilatation or surrounding inflammatory changes. Spleen: Normal in size without focal abnormality. Adrenals/Urinary Tract: No urinary tract calculi or obstructive uropathy. The bladder is decompressed, limiting its evaluation. The adrenals are normal.  Stomach/Bowel: Percutaneous gastrostomy tube within the stomach. There is moderate gaseous distention of the stomach. No bowel obstruction or ileus. Normal appendix right lower quadrant. No bowel wall thickening or inflammatory change. Vascular/Lymphatic: Aortic atherosclerosis. No enlarged abdominal or pelvic lymph nodes. Reproductive: Uterus and bilateral adnexa are unremarkable. Other: Trace pelvic free fluid. No free intraperitoneal gas. No abdominal wall hernia. Musculoskeletal: No acute or destructive bony lesions. There is diffuse subcutaneous edema consistent with anasarca. Reconstructed images demonstrate no additional findings. IMPRESSION: 1. Patchy bibasilar airspace disease consistent with multifocal pneumonia or aspiration. 2. No urinary tract calculi or obstructive uropathy. 3. Trace pelvic free fluid, nonspecific. 4. Diffuse anasarca. 5. Otherwise unremarkable unenhanced exam. Electronically Signed   By: Randa Ngo M.D.   On: 07/03/2020 21:31   DG Ankle Complete Right  Result Date: 06/18/2020 CLINICAL DATA:  Pain and tenderness. EXAM: RIGHT ANKLE - COMPLETE 3+ VIEW COMPARISON:  None. FINDINGS: Portable exam limited by oblique positioning on the lateral projection. Within this limitation there is no gross fracture or evidence of dislocation. Soft tissue swelling evident. No worrisome lytic or sclerotic osseous abnormality. IMPRESSION: Limited study due to positioning. Within this limitation, no gross fracture or dislocation. Electronically Signed   By: Misty Stanley M.D.   On: 06/18/2020 08:41   DG Chest Port 1 View  Result Date: 07/04/2020 CLINICAL DATA:  Respiratory failure. EXAM: PORTABLE CHEST 1 VIEW COMPARISON:  Chest x-ray 07/03/2020. FINDINGS: Tracheostomy tube in stable position. The heart size and mediastinal contours are within normal limits. Aortic arch calcifications. Hyperinflation with flattening of the hemidiaphragms consistent with emphysematous changes. Coarsened  interstitial markings. Redemonstration of a couple of right upper lobe subcentimeter nodularities. Redemonstration of left mid lung zone patchy airspace opacity as well as to lesser extent within the right lower lung zone. Bilateral trace pleural effusions. No pneumothorax. No acute osseous abnormality. IMPRESSION: 1. Persistent multifocal pneumonia. Superimposed pulmonary edema not excluded. 2. Bilateral trace pleural effusions. 3. Aortic Atherosclerosis (ICD10-I70.0) and Emphysema (ICD10-J43.9). Electronically Signed   By: Iven Finn M.D.   On: 07/04/2020 06:22   DG Chest Port 1 View  Result Date: 07/03/2020 CLINICAL DATA:  Shortness of breath. EXAM: PORTABLE CHEST 1 VIEW COMPARISON:  June 27, 2020 FINDINGS: Tracheostomy catheter tip is 7.5 cm above the carina. No pneumothorax. There is ill-defined airspace opacity in the left mid lung and in both lung bases. There appears to be underlying emphysematous change in fibrosis. Heart size and pulmonary vascularity normal. No adenopathy. No bone lesions. IMPRESSION: Tracheostomy as described without pneumothorax. Ill-defined airspace opacity left mid lung and base regions consistent with multifocal pneumonia. Underlying scarring and emphysematous change. Appearance raises concern for atypical organism pneumonia. Check of COVID-19 status advised. Heart size normal.  No adenopathy appreciable. Electronically Signed   By: Lowella Grip III M.D.   On: 07/03/2020 15:29   DG CHEST PORT 1 VIEW  Result Date: 06/27/2020 CLINICAL DATA:  Shortness of breath EXAM: PORTABLE CHEST 1 VIEW COMPARISON:  06/23/20 FINDINGS: Cardiac shadow is stable. The lungs are hyperinflated stable from the prior exam. Patchy airspace opacity is noted in the left mid lung slightly  improved from the prior study. Mild interstitial changes are again identified and stable. No new focal abnormality is seen. IMPRESSION: Slight improvement in focal airspace opacity in the left mid lung.  Electronically Signed   By: Inez Catalina M.D.   On: 06/27/2020 22:42   DG CHEST PORT 1 VIEW  Result Date: 06/23/2020 CLINICAL DATA:  Respiratory failure EXAM: PORTABLE CHEST 1 VIEW COMPARISON:  March 13, 2020 in June 15, 2019 FINDINGS: The tracheostomy tube is in good position. No pneumothorax. Nodularity in the periphery of the right upper lobe is stable. Hyperinflation of the lungs. The cardiomediastinal silhouette is stable. Increased interstitial markings on the right. More pronounced opacity in the left lung diffusely, most prominent in the left perihilar region. There is a focal opacity projected over the left hilum. No other acute abnormalities. IMPRESSION: 1. Tracheostomy tube is above. 2. New focal opacity in the left hilar region, suggestive of developing infiltrate/pneumonia. More diffuse opacity throughout the left mid lung may represent part of this infectious process. Recommend attention on follow-up. 3. Mild diffuse increased interstitial opacities on the right may represent asymmetric edema versus atypical infection. 4. No other changes. Electronically Signed   By: Dorise Bullion III M.D   On: 06/23/2020 07:06   DG CHEST PORT 1 VIEW  Result Date: 06/14/2020 CLINICAL DATA:  Chest pain EXAM: PORTABLE CHEST 1 VIEW COMPARISON:  06/10/2020 FINDINGS: Blunting of the costophrenic angles likely reflecting persistent trace pleural effusions. Improved left lung aeration with persistent patchy density primarily at the lung base. No pneumothorax. Stable cardiomediastinal contours. Tracheostomy device is present. IMPRESSION: Improved left lung aeration with persistent patchy atelectasis/consolidation at the base. Similar trace pleural effusions. Electronically Signed   By: Macy Mis M.D.   On: 06/14/2020 13:35   DG Chest Port 1 View  Result Date: 06/10/2020 CLINICAL DATA:  Acute on chronic respiratory failure. EXAM: PORTABLE CHEST 1 VIEW COMPARISON:  06/08/2020 FINDINGS: A tracheostomy  tube is again noted. Cardiomediastinal silhouette is unchanged. Emphysema changes are again noted. Interstitial opacities within the LEFT lung and LEFT basilar atelectasis again noted. There is no evidence of pneumothorax. IMPRESSION: Little significant change from the prior study with LEFT lung interstitial opacities and LEFT basilar atelectasis. Electronically Signed   By: Margarette Canada M.D.   On: 06/10/2020 08:25   DG Knee Right Port  Result Date: 06/18/2020 CLINICAL DATA:  Tenderness and pain EXAM: PORTABLE RIGHT KNEE - 1-2 VIEW COMPARISON:  None. FINDINGS: No joint effusion. There is no fracture or dislocation. No radio-opaque foreign body or soft tissue calcification. IMPRESSION: Negative. Electronically Signed   By: Kerby Moors M.D.   On: 06/18/2020 08:42   DG Foot Complete Right  Result Date: 06/18/2020 CLINICAL DATA:  Pain and tenderness. EXAM: RIGHT FOOT COMPLETE - 3+ VIEW COMPARISON:  None. FINDINGS: There is no evidence of fracture or dislocation. There is no evidence of arthropathy or other focal bone abnormality. Soft tissues are unremarkable. IMPRESSION: Negative. Electronically Signed   By: Misty Stanley M.D.   On: 06/18/2020 08:42   VAS [place] UPPER EXTREMITY VENOUS DUPLEX  Result Date: 06/12/2020 UPPER VENOUS STUDY  Indications: Edema Anticoagulation: Lovenox. Comparison Study: No prior studies. Performing Technologist: Darlin Coco RDMS  Examination Guidelines: A complete evaluation includes B-mode imaging, spectral Doppler, color Doppler, and power Doppler as needed of all accessible portions of each vessel. Bilateral testing is considered an integral part of a complete examination. Limited examinations for reoccurring indications may be performed as noted.  Right Findings: +----------+------------+---------+-----------+----------+-------+  RIGHT     CompressiblePhasicitySpontaneousPropertiesSummary +----------+------------+---------+-----------+----------+-------+ Subclavian     Full       Yes       Yes                      +----------+------------+---------+-----------+----------+-------+  Left Findings: +----------+------------+---------+-----------+----------+---------------------+ LEFT      CompressiblePhasicitySpontaneousProperties       Summary        +----------+------------+---------+-----------+----------+---------------------+ Subclavian               Yes       Yes               Smal, focal area of                                                        age indeterminate                                                             thrombus        +----------+------------+---------+-----------+----------+---------------------+ Axillary      Full       Yes       Yes                                    +----------+------------+---------+-----------+----------+---------------------+ Brachial      Full                                                        +----------+------------+---------+-----------+----------+---------------------+ Radial        Full                                                        +----------+------------+---------+-----------+----------+---------------------+ Ulnar         Full                                                        +----------+------------+---------+-----------+----------+---------------------+ Cephalic    Partial      Yes       Yes                Age Indeterminate   +----------+------------+---------+-----------+----------+---------------------+ Basilic       Full       Yes       Yes                                    +----------+------------+---------+-----------+----------+---------------------+  Summary:  Right: No evidence of thrombosis in the subclavian.  Left: Findings consistent with a small, focal age indeterminate deep vein thrombus involving the left subclavian vein. Findings consistent with age indeterminate superficial vein thrombosis involving the left  cephalic vein at the forearm.  *See table(s) above for measurements and observations.  Diagnosing physician: Deitra Mayo MD Electronically signed by Deitra Mayo MD on 06/12/2020 at 6:27:00 PM.    Final     (Echo, Carotid, EGD, Colonoscopy, ERCP)    Subjective: Patient seen and examined.  No overnight events.  We used magnesium citrate and she had excellent bowel movements.  Still has mild pain on the left lower quadrant but much improved than before.  Looking forward to go home.   Discharge Exam: Vitals:   07/09/20 0800 07/09/20 0826  BP: 123/74   Pulse: 95   Resp: 17   Temp:  98.1 F (36.7 C)  SpO2: 100%    Vitals:   07/09/20 0731 07/09/20 0734 07/09/20 0800 07/09/20 0826  BP:   123/74   Pulse:   95   Resp:   17   Temp:    98.1 F (36.7 C)  TempSrc:    Oral  SpO2: 99% 98% 100%   Weight:        General: Pt is alert, awake, not in acute distress Patient is on home ventilator settings with Trelegy at bedside.  Looks comfortable.  She has trouble speaking but communicates with gestures and head nodding. Cardiovascular: RRR, S1/S2 +, no rubs, no gallops Respiratory: CTA bilaterally, no wheezing, no rhonchi Abdominal: Soft, ND, bowel sounds +, mild tenderness left lower quadrant.  No residual guarding.  PEG tube in place. Extremities: no edema, no cyanosis    The results of significant diagnostics from this hospitalization (including imaging, microbiology, ancillary and laboratory) are listed below for reference.     Microbiology: Recent Results (from the past 240 hour(s))  Expectorated Sputum Assessment w Gram Stain, Rflx to Resp Cult     Status: None   Collection Time: 07/03/20  3:49 PM   Specimen: Expectorated Sputum  Result Value Ref Range Status   Specimen Description EXPECTORATED SPUTUM  Final   Special Requests NONE  Final   Sputum evaluation   Final    THIS SPECIMEN IS ACCEPTABLE FOR SPUTUM CULTURE Performed at [facility], [address] [place], [place] [address]    Report Status 07/04/2020 FINAL  Final  Culture, Respiratory w Gram Stain     Status: None   Collection Time: 07/03/20  3:49 PM  Result Value Ref Range Status   Specimen Description EXPECTORATED SPUTUM  Final   Special Requests NONE Reflexed from [id number]  Final   Gram Stain   Final    MODERATE WBC PRESENT, PREDOMINANTLY PMN FEW GRAM POSITIVE RODS    Culture   Final    MODERATE Normal respiratory flora-no Staph aureus or Pseudomonas seen Performed at [facility], [place] [address], [place], [place] [address]    Report Status 07/06/2020 FINAL  Final  Blood culture (routine x 2)     Status: None (Preliminary result)   Collection Time: 07/03/20  4:28 PM   Specimen: BLOOD RIGHT ARM  Result Value Ref Range Status   Specimen Description BLOOD RIGHT ARM  Final   Special Requests   Final    BOTTLES DRAWN AEROBIC AND ANAEROBIC Blood Culture adequate volume   Culture   Final    NO GROWTH 4 DAYS Performed at [facility], [place],  [place] [address]    Report Status PENDING  Incomplete  Resp Panel by RT-PCR (Flu A&B, Covid) Nasopharyngeal Swab     Status: None   Collection Time: 07/03/20  4:30 PM   Specimen: Nasopharyngeal Swab; Nasopharyngeal(NP) swabs in vial transport medium  Result Value Ref Range Status   SARS Coronavirus 2 by RT PCR NEGATIVE NEGATIVE Final    Comment: (NOTE) SARS-CoV-2 target nucleic acids are NOT DETECTED.  The SARS-CoV-2 RNA is generally detectable in upper respiratory specimens during the acute phase of infection. The lowest concentration of SARS-CoV-2 viral copies this assay can detect is 138 copies/mL. A negative result does not preclude SARS-Cov-2 infection and should not be used as the sole basis for treatment or other patient management decisions. A negative result may occur with  improper specimen collection/handling, submission of specimen other than nasopharyngeal swab, presence of viral  mutation(s) within the areas targeted by this assay, and inadequate number of viral copies(<138 copies/mL). A negative result must be combined with clinical observations, patient history, and epidemiological information. The expected result is Negative.  Fact Sheet for Patients:  EntrepreneurPulse.com.au  Fact Sheet for Healthcare Providers:  IncredibleEmployment.be  This test is no t yet approved or cleared by the [place] FDA and  has been authorized for detection and/or diagnosis of SARS-CoV-2 by FDA under an Emergency Use Authorization (EUA). This EUA will remain  in effect (meaning this test can be used) for the duration of the COVID-19 declaration under Section 564(b)(1) of the Act, 21 U.S.C.section 360bbb-3(b)(1), unless the authorization is terminated  or revoked sooner.       Influenza A by PCR NEGATIVE NEGATIVE Final   Influenza B by PCR NEGATIVE NEGATIVE Final    Comment: (NOTE) The Xpert Xpress SARS-CoV-2/FLU/RSV plus assay is intended as an aid in the diagnosis of influenza from Nasopharyngeal swab specimens and should not be used as a sole basis for treatment. Nasal washings and aspirates are unacceptable for Xpert Xpress SARS-CoV-2/FLU/RSV testing.  Fact Sheet for Patients: EntrepreneurPulse.com.au  Fact Sheet for Healthcare Providers: IncredibleEmployment.be  This test is not yet approved or cleared by the [place] FDA and has been authorized for detection and/or diagnosis of SARS-CoV-2 by FDA under an Emergency Use Authorization (EUA). This EUA will remain in effect (meaning this test can be used) for the duration of the COVID-19 declaration under Section 564(b)(1) of the Act, 21 U.S.C. section 360bbb-3(b)(1), unless the authorization is terminated or revoked.  Performed at [facility], [place] [address], [place], [place] [address]   MRSA PCR Screening     Status: None    Collection Time: 07/03/20  7:52 PM   Specimen: Nasopharyngeal  Result Value Ref Range Status   MRSA by PCR NEGATIVE NEGATIVE Final    Comment:        The GeneXpert MRSA Assay (FDA approved for NASAL specimens only), is one component of a comprehensive MRSA colonization surveillance program. It is not intended to diagnose MRSA infection nor to guide or monitor treatment for MRSA infections. Performed at [facility], [place] [address] [place], [place] [address]   Blood culture (routine x 2)     Status: None (Preliminary result)   Collection Time: 07/03/20  8:26 PM   Specimen: BLOOD RIGHT FOREARM  Result Value Ref Range Status   Specimen Description BLOOD RIGHT FOREARM  Final   Special Requests   Final    BOTTLES DRAWN AEROBIC ONLY Blood Culture results may not be optimal due  to an inadequate volume of blood received in culture bottles   Culture   Final    NO GROWTH 4 DAYS Performed at [facility], [place] [address], [place], [place] [address]    Report Status PENDING  Incomplete     Labs: BNP (last 3 results) Recent Labs    09/06/19 1414 01/28/20 1503 02/25/20 1115  BNP 32.2 106.8* 875.6*   Basic Metabolic Panel: Recent Labs  Lab 07/03/20 1453 07/03/20 1648 07/03/20 2003 07/03/20 2130 07/04/20 0733 07/06/20 0446 07/08/20 0230  NA 138   < > 139 139 137 139 137  K 4.1   < > 4.1 4.1 4.0 3.4* 3.8  CL 98  --   --   --  97* 100 98  CO2 31  --   --   --  28 33* 30  GLUCOSE 152*  --   --   --  80 100* 91  BUN 17  --   --   --  24* 13 9  CREATININE 0.67  --   --   --  0.68 0.46 0.45  CALCIUM 10.2  --   --   --  9.3 8.9 9.2  MG  --   --   --   --  1.9  --  1.7  PHOS  --   --   --   --  5.4* 2.6 3.8   < > = values in this interval not displayed.   Liver Function Tests: No results for input(s): AST, ALT, ALKPHOS, BILITOT, PROT, ALBUMIN in the last 168 hours. No results for input(s): LIPASE, AMYLASE in the last 168 hours. No results for input(s): AMMONIA  in the last 168 hours. CBC: Recent Labs  Lab 07/03/20 1453 07/03/20 1648 07/04/20 0733 07/05/20 0908 07/05/20 1124 07/06/20 0446 07/06/20 1551 07/06/20 2315 07/08/20 0230  WBC 29.1*  --  12.1* 10.1 9.9 10.4  --   --  9.5  NEUTROABS 25.6*  --   --   --   --  7.7  --   --  6.5  HGB 8.9*   < > 7.9* 6.8* 7.2* 7.0* 6.7* 7.6* 8.3*  HCT 29.7*   < > 25.9* 22.3* 23.5* 23.1* 21.9* 24.0* 26.7*  MCV 94.0  --  90.6 90.7 91.1 90.6  --   --  89.6  PLT 507*  --  378 278 299 276  --   --  285   < > = values in this interval not displayed.   Cardiac Enzymes: No results for input(s): CKTOTAL, CKMB, CKMBINDEX, TROPONINI in the last 168 hours. BNP: Invalid input(s): POCBNP CBG: Recent Labs  Lab 07/05/20 2303 07/06/20 0259 07/06/20 0745 07/06/20 1148 07/06/20 1552  GLUCAP 107* 100* 73 75 97   D-Dimer No results for input(s): DDIMER in the last 72 hours. Hgb A1c No results for input(s): HGBA1C in the last 72 hours. Lipid Profile No results for input(s): CHOL, HDL, LDLCALC, TRIG, CHOLHDL, LDLDIRECT in the last 72 hours. Thyroid function studies No results for input(s): TSH, T4TOTAL, T3FREE, THYROIDAB in the last 72 hours.  Invalid input(s): FREET3 Anemia work up No results for input(s): VITAMINB12, FOLATE, FERRITIN, TIBC, IRON, RETICCTPCT in the last 72 hours. Urinalysis    Component Value Date/Time   COLORURINE YELLOW 03/20/2020 0018   APPEARANCEUR HAZY (A) 03/20/2020 0018   LABSPEC 1.006 03/20/2020 0018   PHURINE 5.0 03/20/2020 0018   GLUCOSEU NEGATIVE 03/20/2020 0018   HGBUR NEGATIVE 03/20/2020 0018  BILIRUBINUR NEGATIVE 03/20/2020 0018   KETONESUR NEGATIVE 03/20/2020 0018   PROTEINUR NEGATIVE 03/20/2020 0018   UROBILINOGEN 1.0 11/12/2008 1440   NITRITE NEGATIVE 03/20/2020 0018   LEUKOCYTESUR NEGATIVE 03/20/2020 0018   Sepsis Labs Invalid input(s): PROCALCITONIN,  WBC,  LACTICIDVEN Microbiology Recent Results (from the past 240 hour(s))  Expectorated Sputum Assessment w  Gram Stain, Rflx to Resp Cult     Status: None   Collection Time: 07/03/20  3:49 PM   Specimen: Expectorated Sputum  Result Value Ref Range Status   Specimen Description EXPECTORATED SPUTUM  Final   Special Requests NONE  Final   Sputum evaluation   Final    THIS SPECIMEN IS ACCEPTABLE FOR SPUTUM CULTURE Performed at [facility], [place] [address], [place], [place] [address]    Report Status 07/04/2020 FINAL  Final  Culture, Respiratory w Gram Stain     Status: None   Collection Time: 07/03/20  3:49 PM  Result Value Ref Range Status   Specimen Description EXPECTORATED SPUTUM  Final   Special Requests NONE Reflexed from T21111  Final   Gram Stain   Final    MODERATE WBC PRESENT, PREDOMINANTLY PMN FEW GRAM POSITIVE RODS    Culture   Final    MODERATE Normal respiratory flora-no Staph aureus or Pseudomonas seen Performed at [facility], [place] [address], [place], [place] [address]    Report Status 07/06/2020 FINAL  Final  Blood culture (routine x 2)     Status: None (Preliminary result)   Collection Time: 07/03/20  4:28 PM   Specimen: BLOOD RIGHT ARM  Result Value Ref Range Status   Specimen Description BLOOD RIGHT ARM  Final   Special Requests   Final    BOTTLES DRAWN AEROBIC AND ANAEROBIC Blood Culture adequate volume   Culture   Final    NO GROWTH 4 DAYS Performed at [facility], [place] [address], [place], [place] [address]    Report Status PENDING  Incomplete  Resp Panel by RT-PCR (Flu A&B, Covid) Nasopharyngeal Swab     Status: None   Collection Time: 07/03/20  4:30 PM   Specimen: Nasopharyngeal Swab; Nasopharyngeal(NP) swabs in vial transport medium  Result Value Ref Range Status   SARS Coronavirus 2 by RT PCR NEGATIVE NEGATIVE Final    Comment: (NOTE) SARS-CoV-2 target nucleic acids are NOT DETECTED.  The SARS-CoV-2 RNA is generally detectable in upper respiratory specimens during the acute phase of infection. The lowest concentration of SARS-CoV-2  viral copies this assay can detect is 138 copies/mL. A negative result does not preclude SARS-Cov-2 infection and should not be used as the sole basis for treatment or other patient management decisions. A negative result may occur with  improper specimen collection/handling, submission of specimen other than nasopharyngeal swab, presence of viral mutation(s) within the areas targeted by this assay, and inadequate number of viral copies(<138 copies/mL). A negative result must be combined with clinical observations, patient history, and epidemiological information. The expected result is Negative.  Fact Sheet for Patients:  EntrepreneurPulse.com.au  Fact Sheet for Healthcare Providers:  IncredibleEmployment.be  This test is no t yet approved or cleared by the [place] FDA and  has been authorized for detection and/or diagnosis of SARS-CoV-2 by FDA under an Emergency Use Authorization (EUA). This EUA will remain  in effect (meaning this test can be used) for the duration of the COVID-19 declaration under Section 564(b)(1) of the Act, 21 U.S.C.section 360bbb-3(b)(1), unless the authorization is  terminated  or revoked sooner.       Influenza A by PCR NEGATIVE NEGATIVE Final   Influenza B by PCR NEGATIVE NEGATIVE Final    Comment: (NOTE) The Xpert Xpress SARS-CoV-2/FLU/RSV plus assay is intended as an aid in the diagnosis of influenza from Nasopharyngeal swab specimens and should not be used as a sole basis for treatment. Nasal washings and aspirates are unacceptable for Xpert Xpress SARS-CoV-2/FLU/RSV testing.  Fact Sheet for Patients: EntrepreneurPulse.com.au  Fact Sheet for Healthcare Providers: IncredibleEmployment.be  This test is not yet approved or cleared by the [place] FDA and has been authorized for detection and/or diagnosis of SARS-CoV-2 by FDA under an Emergency Use Authorization  (EUA). This EUA will remain in effect (meaning this test can be used) for the duration of the COVID-19 declaration under Section 564(b)(1) of the Act, 21 U.S.C. section 360bbb-3(b)(1), unless the authorization is terminated or revoked.  Performed at [facility], [place] [address] [place], [place] [address]   MRSA PCR Screening     Status: None   Collection Time: 07/03/20  7:52 PM   Specimen: Nasopharyngeal  Result Value Ref Range Status   MRSA by PCR NEGATIVE NEGATIVE Final    Comment:        The GeneXpert MRSA Assay (FDA approved for NASAL specimens only), is one component of a comprehensive MRSA colonization surveillance program. It is not intended to diagnose MRSA infection nor to guide or monitor treatment for MRSA infections. Performed at [facility], [place] [address], [place], [place] [address]   Blood culture (routine x 2)     Status: None (Preliminary result)   Collection Time: 07/03/20  8:26 PM   Specimen: BLOOD RIGHT FOREARM  Result Value Ref Range Status   Specimen Description BLOOD RIGHT FOREARM  Final   Special Requests   Final    BOTTLES DRAWN AEROBIC ONLY Blood Culture results may not be optimal due to an inadequate volume of blood received in culture bottles   Culture   Final    NO GROWTH 4 DAYS Performed at [facility], [place] [address] [place], [place] [address]    Report Status PENDING  Incomplete     Time coordinating discharge:  40 minutes  SIGNED:   Barb Merino, MD  [facility] 07/09/2020, 10:33 AM

## 2020-07-09 NOTE — TOC Transition Note (Signed)
Transition of Care [facility]) - CM/SW Discharge Note   Patient Details  Name: Kathleen Cameron MRN: [id number] Date of Birth: 1965-09-06  [facility] CM/SW Contact:  Epifanio Lesches, RN Phone Number: [phone] 07/09/2020, 3:18 PM   Clinical Narrative:    Patient will DC to: home Anticipated DC date: 07/09/2020 Family notified: yes, husband Transport by: [facility]   Readmitted with hypoxia in the setting of ? mucous plug          -  Hx of severe COPD and chronic vent   Per MD patient ready for DC today. RN, patient, patient's husband, [name] HH/ Cleone Slim [phone]) and [employer] RT, Peggye Fothergill [phone]) notified of DC.  Per Rob pt now with [facility] Private Duty Nursing hours have increased to 112 hrs. Pt's primary insurance with [employer] expired 06/19/2020.  Ambulance transport requested for patient, husband to accompany. Per husband has a same size trach and smaller trach, along with ambu bag.  Shaune Spittle (Spouse)     [phone]       RNCM will sign off for now as intervention is no longer needed. Please consult [place] again if new needs arise.   RNCM will sign off for now as intervention is no longer needed. Please consult [place] again if new needs arise.    Final next level of care: Home w Home Health Services Barriers to Discharge: No Barriers Identified   Patient Goals and CMS Choice        Discharge Placement                       Discharge Plan and Services                                     Social Determinants of Health (SDOH) Interventions     Readmission Risk Interventions Readmission Risk Prevention Plan 02/02/2020  Transportation Screening Complete  PCP or Specialist Appt within 3-5 Days Complete  HRI or Home Care Consult Complete  Social Work Consult for Recovery Care Planning/Counseling Complete  Palliative Care Screening Not Applicable  Medication Review Oceanographer) Complete  Some recent data might be  hidden

## 2020-07-10 ENCOUNTER — Telehealth: Payer: Self-pay | Admitting: Internal Medicine

## 2020-07-10 ENCOUNTER — Telehealth: Payer: Self-pay | Admitting: *Deleted

## 2020-07-10 ENCOUNTER — Telehealth: Payer: Self-pay | Admitting: Emergency Medicine

## 2020-07-10 ENCOUNTER — Other Ambulatory Visit: Payer: Self-pay | Admitting: Emergency Medicine

## 2020-07-10 ENCOUNTER — Other Ambulatory Visit: Payer: Self-pay

## 2020-07-10 DIAGNOSIS — J9611 Chronic respiratory failure with hypoxia: Secondary | ICD-10-CM

## 2020-07-10 LAB — CULTURE, BLOOD (ROUTINE X 2)
Culture: NO GROWTH
Culture: NO GROWTH
Special Requests: ADEQUATE

## 2020-07-10 MED ORDER — BUDESONIDE 0.25 MG/2ML IN SUSP: 0.2500 mg | Freq: Two times a day (BID) | RESPIRATORY_TRACT | 12 refills | Status: DC

## 2020-07-10 MED ORDER — REVEFENACIN 175 MCG/3ML IN SOLN: 175.0000 ug | Freq: Every day | RESPIRATORY_TRACT | 3 refills | Status: DC

## 2020-07-10 MED ORDER — ARFORMOTEROL TARTRATE 15 MCG/2ML IN NEBU: 15.0000 ug | INHALATION_SOLUTION | Freq: Two times a day (BID) | RESPIRATORY_TRACT | Status: DC

## 2020-07-10 MED ORDER — TEMAZEPAM 15 MG PO CAPS: 15.0000 mg | ORAL_CAPSULE | Freq: Every day | ORAL | 0 refills | Status: DC

## 2020-07-10 MED ORDER — REVEFENACIN 175 MCG/3ML IN SOLN: 175.0000 ug | Freq: Every day | RESPIRATORY_TRACT | Status: DC

## 2020-07-10 MED ORDER — ARFORMOTEROL TARTRATE 15 MCG/2ML IN NEBU: 15.0000 ug | INHALATION_SOLUTION | Freq: Two times a day (BID) | RESPIRATORY_TRACT | 3 refills | Status: DC

## 2020-07-10 MED ORDER — ROSUVASTATIN CALCIUM 40 MG PO TABS: 40.0000 mg | ORAL_TABLET | Freq: Every day | ORAL | Status: DC

## 2020-07-10 MED ORDER — VENLAFAXINE HCL 37.5 MG PO TABS: 37.5000 mg | ORAL_TABLET | Freq: Two times a day (BID) | ORAL | Status: DC

## 2020-07-10 MED ORDER — BISACODYL 10 MG RE SUPP
10.0000 mg | Freq: Every day | RECTAL | 0 refills | Status: AC | PRN
Start: 2020-07-10 — End: ?

## 2020-07-10 MED ORDER — OSMOLITE 1.2 CAL PO LIQD: 1000.0000 mL | ORAL | 0 refills | Status: AC

## 2020-07-10 MED ORDER — PANTOPRAZOLE SODIUM 40 MG PO PACK: 40.0000 mg | PACK | Freq: Every day | ORAL | Status: DC

## 2020-07-10 MED ORDER — CHLORHEXIDINE GLUCONATE 0.12% ORAL RINSE (MEDLINE KIT): 15.0000 mL | Freq: Two times a day (BID) | OROMUCOSAL | 0 refills | Status: DC

## 2020-07-10 MED ORDER — CHLORHEXIDINE GLUCONATE 0.12% ORAL RINSE (MEDLINE KIT): 15.0000 mL | Freq: Two times a day (BID) | OROMUCOSAL | 2 refills | Status: DC

## 2020-07-10 MED ORDER — CLONAZEPAM 1 MG PO TABS: 1.0000 mg | ORAL_TABLET | Freq: Three times a day (TID) | ORAL | 0 refills | Status: DC

## 2020-07-10 NOTE — Telephone Encounter (Signed)
I apologize for the long message and number of requests.   Call from [facility] with several requests on behalf of the patient.  Notes she has been in and out of the hospital over the past week. Notes the hospital was going to send 11 Rx refills for her but did not. (pended below).  A 10 liter concentrator as hers is 5 and she is using 5 liters a min, also requested refillable D Tank for emergencies (pended these in a DME oxygen order below)       I don't know how to order these: Also requested a stationary/table top pulseox for her to have continuous monitoring.  A stationary suction machine (they note she has portable but would be better to have stationary)    Pt husband is going to call today to make f/u appt from hospital visit.

## 2020-07-10 NOTE — Progress Notes (Unsigned)
Pt discharged from hospital last night and evaluated by home health they have requested pended medications as well as several home orders due to pt condition

## 2020-07-10 NOTE — Telephone Encounter (Signed)
Patient's spouse Earvin Hansen called to schedule hospital follow up (patient in hospital since 02/2020; discharge date 07/09/20).   Earvin Hansen stated that the medications prescribed by the hospital haven't been called in to the pharmacy; Earvin Hansen asked if we could prescribe them instead. Advised patient to follow up with prescribing provider.  Hospital follow up scheduled for 07/12/20. Patient on a respirator, will need oxygen, and is being transported by EMS.

## 2020-07-10 NOTE — Telephone Encounter (Signed)
Transition Care Management Unsuccessful Follow-up Telephone Call  Date of discharge and from where:  07/09/2020 - [facility]  Attempts:  1st Attempt  Reason for unsuccessful TCM follow-up call:  Unable to reach patient

## 2020-07-10 NOTE — Telephone Encounter (Signed)
[place] calling   From [facility] they are requesting some Rx prescriptions change on  on oxygen currently on 5 liters and needs a 10 liter concentrator  also need refillable  D tank she has 2 but needs at least 3 /  Also. She would like to request stationary pulsox and doctor would need to  send perimeters  High and Low stats hear trate   And also stationary suction machine    Any questions call Meriam Sprague at [phone]  prescription can be faxed to  [phone]    Needs clarification on current  tube feeding order /   Please call [facility]

## 2020-07-10 NOTE — Telephone Encounter (Signed)
Per [facility] nurse, meds not sent to pharmacy? Will resend Rx. RN to notify

## 2020-07-11 ENCOUNTER — Telehealth: Payer: Self-pay | Admitting: Emergency Medicine

## 2020-07-11 NOTE — Telephone Encounter (Signed)
Call returned to patient, spoke with her husband, confirmed patient DOB. Patient husband states she was prescribed Brovana and Pulmicort while she was in the hospital however the pharmacy is stating they need some type of letter in order for the patient to receive the medication.   Call made to pharmacy, both Brovana and Pulmicort need PA.    PA initiated via CMM.   Brovana key: [id number]  Pulmicort [id number]  Will route to triage to hold and check on tomorrow.

## 2020-07-11 NOTE — Telephone Encounter (Signed)
LMTCB

## 2020-07-11 NOTE — Telephone Encounter (Signed)
Transition Care Management Follow-up Telephone Call  Date of discharge and from where: 07/09/2020 - [facility]  How have you been since you were released from the hospital? "I am okay"  Any questions or concerns? No  Items Reviewed:  Did the pt receive and understand the discharge instructions provided? Yes   Medications obtained and verified? Yes   Other? No   Any new allergies since your discharge? No   Dietary orders reviewed? No  Do you have support at home? Yes  Home Care and Equipment/Supplies: Were home health services ordered? not applicable If so, what is the name of the agency? N/A  Has the agency set up a time to come to the patient's home? not applicable Were any new equipment or medical supplies ordered?  No What is the name of the medical supply agency? N/A Were you able to get the supplies/equipment? not applicable Do you have any questions related to the use of the equipment or supplies? No  Functional Questionnaire: (I = Independent and D = Dependent) ADLs: D  Bathing/Dressing- D  Meal Prep- D  Eating- I  Maintaining continence- D  Transferring/Ambulation- D  Managing Meds- I  Follow up appointments reviewed:   PCP Hospital f/u appt confirmed? Yes  Scheduled to see Dr. Alvy Bimler on 07/12/2020 @ 1100.  Specialist Hospital f/u appt confirmed? No    Are transportation arrangements needed? Yes  - EMS will be transporting the pt  If their condition worsens, is the pt aware to call PCP or go to the Emergency Dept.? Yes  Was the patient provided with contact information for the PCP's office or ED? Yes  Was to pt encouraged to call back with questions or concerns? Yes

## 2020-07-11 NOTE — Telephone Encounter (Signed)
Earvin Hansen husband is returning phone call. Earvin Hansen phone number is [phone].

## 2020-07-12 ENCOUNTER — Encounter (HOSPITAL_COMMUNITY): Payer: Self-pay | Admitting: Emergency Medicine

## 2020-07-12 ENCOUNTER — Telehealth: Payer: Medicaid Other | Admitting: Emergency Medicine

## 2020-07-12 ENCOUNTER — Emergency Department (HOSPITAL_COMMUNITY)
Admission: EM | Admit: 2020-07-12 | Discharge: 2020-07-13 | Disposition: A | Discharge status: Home / Self Care | Payer: Medicaid Other | Attending: Emergency Medicine | Admitting: Emergency Medicine

## 2020-07-12 ENCOUNTER — Telehealth: Payer: Self-pay | Admitting: Emergency Medicine

## 2020-07-12 ENCOUNTER — Other Ambulatory Visit: Payer: Self-pay

## 2020-07-12 ENCOUNTER — Emergency Department (HOSPITAL_COMMUNITY): Payer: Medicaid Other

## 2020-07-12 DIAGNOSIS — R079 Chest pain, unspecified: Secondary | ICD-10-CM | POA: Insufficient documentation

## 2020-07-12 DIAGNOSIS — R0602 Shortness of breath: Secondary | ICD-10-CM | POA: Diagnosis not present

## 2020-07-12 DIAGNOSIS — E039 Hypothyroidism, unspecified: Secondary | ICD-10-CM | POA: Insufficient documentation

## 2020-07-12 DIAGNOSIS — Z79899 Other long term (current) drug therapy: Secondary | ICD-10-CM | POA: Diagnosis not present

## 2020-07-12 DIAGNOSIS — N189 Chronic kidney disease, unspecified: Secondary | ICD-10-CM | POA: Diagnosis not present

## 2020-07-12 DIAGNOSIS — R600 Localized edema: Secondary | ICD-10-CM | POA: Diagnosis not present

## 2020-07-12 DIAGNOSIS — J441 Chronic obstructive pulmonary disease with (acute) exacerbation: Secondary | ICD-10-CM | POA: Insufficient documentation

## 2020-07-12 DIAGNOSIS — J45909 Unspecified asthma, uncomplicated: Secondary | ICD-10-CM | POA: Diagnosis not present

## 2020-07-12 DIAGNOSIS — I129 Hypertensive chronic kidney disease with stage 1 through stage 4 chronic kidney disease, or unspecified chronic kidney disease: Secondary | ICD-10-CM | POA: Insufficient documentation

## 2020-07-12 LAB — CBC WITH DIFFERENTIAL/PLATELET
Abs Immature Granulocytes: 0.04 10*3/uL (ref 0.00–0.07)
Basophils Absolute: 0.1 10*3/uL (ref 0.0–0.1)
Basophils Relative: 1 %
Eosinophils Absolute: 0.7 10*3/uL — ABNORMAL HIGH (ref 0.0–0.5)
Eosinophils Relative: 7 %
HCT: 31 % — ABNORMAL LOW (ref 36.0–46.0)
Hemoglobin: 9.5 g/dL — ABNORMAL LOW (ref 12.0–15.0)
Immature Granulocytes: 0 %
Lymphocytes Relative: 18 %
Lymphs Abs: 1.7 10*3/uL (ref 0.7–4.0)
MCH: 28 pg (ref 26.0–34.0)
MCHC: 30.6 g/dL (ref 30.0–36.0)
MCV: 91.4 fL (ref 80.0–100.0)
Monocytes Absolute: 0.5 10*3/uL (ref 0.1–1.0)
Monocytes Relative: 5 %
Neutro Abs: 6.1 10*3/uL (ref 1.7–7.7)
Neutrophils Relative %: 69 %
Platelets: 315 10*3/uL (ref 150–400)
RBC: 3.39 MIL/uL — ABNORMAL LOW (ref 3.87–5.11)
RDW: 16.2 % — ABNORMAL HIGH (ref 11.5–15.5)
WBC: 9.1 10*3/uL (ref 4.0–10.5)
nRBC: 0 % (ref 0.0–0.2)

## 2020-07-12 LAB — I-STAT ARTERIAL BLOOD GAS, ED
Acid-Base Excess: 7 mmol/L — ABNORMAL HIGH (ref 0.0–2.0)
Bicarbonate: 33.4 mmol/L — ABNORMAL HIGH (ref 20.0–28.0)
Calcium, Ion: 1.31 mmol/L (ref 1.15–1.40)
HCT: 27 % — ABNORMAL LOW (ref 36.0–46.0)
Hemoglobin: 9.2 g/dL — ABNORMAL LOW (ref 12.0–15.0)
O2 Saturation: 96 %
Patient temperature: 98.9
Potassium: 3.5 mmol/L (ref 3.5–5.1)
Sodium: 145 mmol/L (ref 135–145)
TCO2: 35 mmol/L — ABNORMAL HIGH (ref 22–32)
pCO2 arterial: 55.8 mmHg — ABNORMAL HIGH (ref 32.0–48.0)
pH, Arterial: 7.386 (ref 7.350–7.450)
pO2, Arterial: 90 mmHg (ref 83.0–108.0)

## 2020-07-12 LAB — COMPREHENSIVE METABOLIC PANEL
ALT: 19 U/L (ref 0–44)
AST: 28 U/L (ref 15–41)
Albumin: 2.3 g/dL — ABNORMAL LOW (ref 3.5–5.0)
Alkaline Phosphatase: 71 U/L (ref 38–126)
Anion gap: 6 (ref 5–15)
BUN: 9 mg/dL (ref 6–20)
CO2: 34 mmol/L — ABNORMAL HIGH (ref 22–32)
Calcium: 9.3 mg/dL (ref 8.9–10.3)
Chloride: 104 mmol/L (ref 98–111)
Creatinine, Ser: 0.58 mg/dL (ref 0.44–1.00)
GFR, Estimated: >60 mL/min (ref >60)
Glucose, Bld: 99 mg/dL (ref 70–99)
Potassium: 3.5 mmol/L (ref 3.5–5.1)
Sodium: 144 mmol/L (ref 135–145)
Total Bilirubin: 0.5 mg/dL (ref 0.3–1.2)
Total Protein: 6.6 g/dL (ref 6.5–8.1)

## 2020-07-12 LAB — TROPONIN I (HIGH SENSITIVITY): Troponin I (High Sensitivity): 11 ng/L (ref <18)

## 2020-07-12 NOTE — ED Notes (Signed)
Husband updated on patient's condition and plan of care.

## 2020-07-12 NOTE — Progress Notes (Signed)
Patient arrived from home on her home vent with SOB. Per EMS family was unable to suction patient's trach due to the catheter not passing through. RT placed on hospital vent. RT noted vent alarming due to patient not receiving any volume. RT noticed that the patient's trach cuff was deflated and put air into the cuff. Patient was then able to receive volumes on vent. RT was able to suction trach, obtaining a moderate amount of thick tan secretions. RT will obtain ABG since patient is c/o SOB.

## 2020-07-12 NOTE — Telephone Encounter (Signed)
MyChart video conference call attempted without success.

## 2020-07-12 NOTE — ED Notes (Signed)
IV team nurses at bedside.

## 2020-07-12 NOTE — Telephone Encounter (Signed)
Checked CMM. At this time [facility] states Amanda PA pending.  Called and spoke with Patient's Husband. He is concerned about Patient not receiving medication needed. He stated Patient has received Pulmicort from pharmacy and pharmacy is waiting for letter from provider to receive other medications. Explained PA has been initiated and PA process. Explained someone will contact them once Patient has been approved or denied.

## 2020-07-12 NOTE — ED Notes (Signed)
Attempted multiple times to start IV access but failed due to edema at both arms , IV team consult ordered.

## 2020-07-12 NOTE — ED Triage Notes (Signed)
Patient arrived with EMS from home reports central chest pain with SOB and tracheostomy discomfort this evening , denies cough or fever , no emesis or diaphoresis . RT evaluated patient's tracheostomy status at arrival / connected to ventilator .

## 2020-07-12 NOTE — Discharge Instructions (Addendum)
Your testing showed that you have normal tests - no signs of heart attack - No signs of worsening pneumonia - in fact your lungs look better - than they did at the last xray.  Please see your doctor in 2 days for recheck ER for worsening symptoms.

## 2020-07-12 NOTE — ED Provider Notes (Signed)
[facility] EMERG[facility] DE[facility]MENT Provider Note   CSN: [id number] Arrival date & time: 07/12/20  1942     History Chief Complaint  Patient presents with  . Chest Pain  . Shortness of Breath    Kathleen Cameron is a 55 y.o. female.  HPI   This patient is a 55 year old female, she is ventilator dependent, she has chronic respiratory failure with hypoxia, severe COPD, hypertension and seizure disorder.  She is nonambulatory, she lives at home with family who reported that tonight while they were trying to reposition her in the bed she developed chest pain.  The patient points to her middle of her chest says it is worse with taking a deep breath and has had the occasional cough but is being able to clear her phlegm.  No fevers or chills, no myalgias, no other complaints.  This is persistent, she has not been around anybody who has been particularly ill, paramedics report that they were unable to suction her tracheostomy  Level 5 caveat applies secondary to the patient's inability to speak, she is mouthing words intermittently  Past Medical History:  Diagnosis Date  . Acute on chronic respiratory failure with hypoxia ([place])   . Anxiety disorder   . Asthma   . COPD (chronic obstructive pulmonary disease) ([place])   . COPD, severe ([place])   . Hypertension   . Seizure disorder [facility])     Patient Active Problem List   Diagnosis Date Noted  . Acute on chronic respiratory failure with hypercapnia ([place]) 07/03/2020  . Tracheostomy status ([place])   . Sepsis ([place]) 06/03/2020  . Healthcare-associated pneumonia 06/03/2020  . Pressure injury of skin 06/03/2020  . Acute respiratory failure ([place])   . Seizure disorder ([place])   . COPD, severe ([place])   . Anxiety disorder   . Generalized abdominal pain   . Convulsions ([place])   . Malnutrition of moderate degree 02/27/2020  . Hypotension   . Acute hypercapnic respiratory failure ([place]) 02/25/2020  . Right upper quadrant abdominal pain  02/01/2020  . Hypothyroidism (acquired) 02/01/2020  . Acute metabolic encephalopathy 31/54/0086  . Hyponatremia 01/28/2020  . Anxiety 12/02/2019  . COPD exacerbation ([place]) 11/06/2019  . Depression 11/06/2019  . Acute on chronic respiratory failure with hypoxia and hypercapnia ([place]) 11/06/2019  . Current moderate episode of major depressive disorder without prior episode ([place]) 09/20/2019  . Pedal edema 08/04/2019  . Chronic respiratory failure with hypoxia ([place]) 03/07/2019  . CKD (chronic kidney disease) 01/05/2019  . COPD (chronic obstructive pulmonary disease) ([place]) 09/29/2018  . Pulmonary nodules 09/27/2018  . Dyslipidemia 07/07/2018  . Essential hypertension 03/10/2018    Past Surgical History:  Procedure Laterality Date  . COLONOSCOPY N/A 06/26/2020   Procedure: COLONOSCOPY;  Surgeon: Carol Ada, MD;  Location: [place];  Service: Endoscopy;  Laterality: N/A;  . ENTEROSCOPY N/A 06/26/2020   Procedure: ENTEROSCOPY;  Surgeon: Carol Ada, MD;  Location: [place];  Service: Endoscopy;  Laterality: N/A;  . HEMOSTASIS CLIP PLACEMENT  06/26/2020   Procedure: HEMOSTASIS CLIP PLACEMENT;  Surgeon: Carol Ada, MD;  Location: [place];  Service: Endoscopy;;  . HOT HEMOSTASIS N/A 06/26/2020   Procedure: HOT HEMOSTASIS (ARGON PLASMA COAGULATION/BICAP);  Surgeon: Carol Ada, MD;  Location: [place];  Service: Endoscopy;  Laterality: N/A;  . IR FLUORO RM 30-60 MIN  03/23/2020  . LAPAROSCOPIC INSERTION GASTROSTOMY TUBE N/A 04/24/2020   Procedure: LAPAROSCOPIC GASTROSTOMY TUBE PLACEMENT;  Surgeon: Kieth Brightly Arta Bruce, MD;  Location: [place];  Service: General;  Laterality: N/A;  . POLYPECTOMY  06/26/2020   Procedure: POLYPECTOMY;  Surgeon: Carol Ada, MD;  Location: [facility] ENDOSCOPY;  Service: Endoscopy;;  . WISDOM TOOTH EXTRACTION       OB History   No obstetric history on file.     Family History  Family history unknown: Yes    Social History   Tobacco Use  . Smoking  status: Never Smoker  . Smokeless tobacco: Never Used  Vaping Use  . Vaping Use: Never used  Substance Use Topics  . Alcohol use: Yes    Alcohol/week: 2.0 standard drinks    Types: 2 Cans of beer per week  . Drug use: Never    Home Medications Prior to Admission medications   Medication Sig Start Date End Date Taking? Authorizing Provider  apixaban (ELIQUIS) 5 MG TABS tablet Place 1 tablet (5 mg total) into feeding tube 2 (two) times daily. 07/02/20 10/30/20  Kayleen Memos, DO  arformoterol (BROVANA) 15 MCG/2ML NEBU Take 2 mLs (15 mcg total) by nebulization 2 (two) times daily. 07/10/20   Candee Furbish, MD  bisacodyl (DULCOLAX) 10 MG suppository Place 1 suppository (10 mg total) rectally daily as needed for severe constipation. 07/10/20   Horald Pollen, MD  budesonide (PULMICORT) 0.25 MG/2ML nebulizer solution Take 2 mLs (0.25 mg total) by nebulization 2 (two) times daily. 07/10/20   Candee Furbish, MD  Chlorhexidine Gluconate Cloth 2 % PADS Apply 6 each topically daily. 03/09/20   Candee Furbish, MD  chlorhexidine gluconate, MEDLINE KIT, (PERIDEX) 0.12 % solution 15 mLs by Mouth Rinse route 2 (two) times daily. 07/10/20   Candee Furbish, MD  clonazePAM (KLONOPIN) 1 MG tablet Place 1 tablet (1 mg total) into feeding tube 3 (three) times daily. 07/10/20   Horald Pollen, MD  levothyroxine (SYNTHROID) 50 MCG tablet Take 1 tablet (50 mcg total) by mouth daily at 6 (six) AM. 02/03/20   Charlynne Cousins, MD  lip balm (CARMEX) ointment Apply topically as needed for lip care. 03/08/20   Candee Furbish, MD  Multiple Vitamin (MULTIVITAMIN WITH MINERALS) TABS tablet Place 1 tablet into feeding tube daily. 07/03/20 10/01/20  Kayleen Memos, DO  Nutritional Supplements (ENSURE ACTIVE HIGH PROTEIN) LIQD Take 1 Can by mouth in the morning and at bedtime. 07/09/20   Barb Merino, MD  Nutritional Supplements (FEEDING SUPPLEMENT, OSMOLITE 1.2 CAL,) LIQD Place 1,000 mLs into feeding tube  continuous. 07/10/20   Horald Pollen, MD  pantoprazole sodium (PROTONIX) 40 mg/20 mL PACK Place 20 mLs (40 mg total) into feeding tube at bedtime. 07/10/20   Horald Pollen, MD  phenol (CHLORASEPTIC) 1.4 % LIQD Use as directed 1 spray in the mouth or throat as needed for throat irritation / pain. 03/08/20   Candee Furbish, MD  polyethylene glycol (MIRALAX / GLYCOLAX) 17 g packet Place 17 g into feeding tube daily as needed for mild constipation. 03/08/20   Candee Furbish, MD  revefenacin (YUPELRI) 175 MCG/3ML nebulizer solution Take 3 mLs (175 mcg total) by nebulization daily. 07/10/20   Candee Furbish, MD  rosuvastatin (CRESTOR) 40 MG tablet Place 1 tablet (40 mg total) into feeding tube daily. 07/10/20   Horald Pollen, MD  senna-docusate (SENOKOT-S) 8.6-50 MG tablet Place 2 tablets into feeding tube at bedtime as needed for moderate constipation. 03/08/20   Candee Furbish, MD  sodium chloride (OCEAN) 0.65 % SOLN nasal spray Place 1 spray into both nostrils as needed  for congestion. 03/08/20   Candee Furbish, MD  temazepam (RESTORIL) 15 MG capsule Place 1 capsule (15 mg total) into feeding tube at bedtime. 07/10/20   Horald Pollen, MD  venlafaxine [facility]) 37.5 MG tablet Place 1 tablet (37.5 mg total) into feeding tube 2 (two) times daily with a meal. 07/10/20   [place], Ines Bloomer, MD    Allergies    Stiolto respimat [tiotropium bromide-olodaterol]  Review of Systems   Review of Systems  Unable to perform ROS: Patient nonverbal    Physical Exam Updated Vital Signs BP (!) 153/99   Pulse 93   Resp (!) 21   Ht 1.651 m ($Remove'5\' 5"'YvFzXgd$ )   Wt 62 kg   SpO2 100%   BMI 22.75 kg/m   Physical Exam Vitals and nursing note reviewed.  Constitutional:      General: She is not in acute distress.    Appearance: She is well-developed.     Comments: Somnolent but easily arousable  HENT:     Head: Normocephalic and atraumatic.     Mouth/Throat:     Pharynx: No  oropharyngeal exudate.  Eyes:     General: No scleral icterus.       Right eye: No discharge.        Left eye: No discharge.     Conjunctiva/sclera: Conjunctivae normal.     Pupils: Pupils are equal, round, and reactive to light.  Neck:     Thyroid: No thyromegaly.     Vascular: No JVD.     Comments: Supple neck, trach present, no discharge drainage bleeding or redness Cardiovascular:     Rate and Rhythm: Normal rate and regular rhythm.     Heart sounds: Normal heart sounds. No murmur heard. No friction rub. No gallop.   Pulmonary:     Effort: Pulmonary effort is normal. No respiratory distress.     Breath sounds: Normal breath sounds. No wheezing or rales.     Comments: Effort consistent with the vent, clear lung sounds Abdominal:     General: Bowel sounds are normal. There is no distension.     Palpations: Abdomen is soft. There is no mass.     Tenderness: There is no abdominal tenderness.  Musculoskeletal:        General: No tenderness. Normal range of motion.     Right lower leg: Edema present.     Left lower leg: Edema present.  Lymphadenopathy:     Cervical: No cervical adenopathy.  Skin:    General: Skin is warm and dry.     Findings: No erythema or rash.  Neurological:     Coordination: Coordination normal.     Comments: Able to move all 4 extremities but has hyperesthesias in the legs, edema, weakness  Psychiatric:        Behavior: Behavior normal.     ED Results / Procedures / Treatments   Labs (all labs ordered are listed, but only abnormal results are displayed) Labs Reviewed  CBC WITH DIFFERENTIAL/PLATELET - Abnormal; Notable for the following components:      Result Value   RBC 3.39 (*)    Hemoglobin 9.5 (*)    HCT 31.0 (*)    RDW 16.2 (*)    Eosinophils Absolute 0.7 (*)    All other components within normal limits  COMPREHENSIVE METABOLIC PANEL - Abnormal; Notable for the following components:   CO2 34 (*)    Albumin 2.3 (*)    All other  components within normal  limits  I-STAT ARTERIAL BLOOD GAS, ED - Abnormal; Notable for the following components:   pCO2 arterial 55.8 (*)    Bicarbonate 33.4 (*)    TCO2 35 (*)    Acid-Base Excess 7.0 (*)    HCT 27.0 (*)    Hemoglobin 9.2 (*)    All other components within normal limits  BLOOD GAS, ARTERIAL  TROPONIN I (HIGH SENSITIVITY)  TROPONIN I (HIGH SENSITIVITY)    EKG EKG Interpretation  Date/Time:  Thursday July 12 2020 19:49:20 EDT Ventricular Rate:  90 PR Interval:    QRS Duration: 86 QT Interval:  406 QTC Calculation: 497 R Axis:   12 Text Interpretation: Sinus rhythm Anterior infarct, old Confirmed by Noemi Chapel [phone]) on 07/12/2020 7:53:45 PM   EKG Interpretation  Date/Time:  Thursday July 12 2020 23:13:24 EDT Ventricular Rate:  93 PR Interval:    QRS Duration: 78 QT Interval:  372 QTC Calculation: 463 R Axis:   3 Text Interpretation: Sinus rhythm Anterior infarct, old Since last tracing rate slower Confirmed by Noemi Chapel [phone]) on 07/12/2020 11:20:40 PM        Radiology DG Chest Port 1 View  Result Date: 07/12/2020 CLINICAL DATA:  Short of breath, tracheostomy EXAM: PORTABLE CHEST 1 VIEW COMPARISON:  07/04/2020 FINDINGS: Single frontal view of the chest demonstrates tracheostomy tube tip overlying tracheal air column at thoracic inlet. Cardiac silhouette is stable. Lungs are hyperinflated with background emphysema again noted. Significant improvement in the bilateral ground-glass airspace disease seen previously, with minimal residual left basilar consolidation. No effusion or pneumothorax. No acute bony abnormalities. IMPRESSION: 1. Significant improvement in bilateral airspace disease, with only minimal residual left basilar consolidation. 2. Emphysema. Electronically Signed   By: Randa Ngo M.D.   On: 07/12/2020 20:36    Procedures Procedures   Medications Ordered in ED Medications - No data to display  ED Course  I have reviewed the  triage vital signs and the nursing notes.  Pertinent labs & imaging results that were available during my care of the patient were reviewed by me and considered in my medical decision making (see chart for details).    MDM Rules/Calculators/A&P                          EKG is nonconcerning, vital signs are nonconcerning, hypertension 161/105 but in this case not likely related.  Has chest pain, she states it is with taking a deep breath, will make sure she does not have a pneumothorax and she is vent dependent, she was recently admitted for multiorgan system failure and sepsis, labs chest x-ray repeat evaluation.  This patient is required no specific interventions at this time.  Her heart rate and blood pressure have remained stable most recently 153/99 with a heart rate of 93.  She has a normal oxygen level, she is ventilating appropriately and not in any distress, she is not breathing over the vent, she has not been complaining of any chest pain since arrival.  I have informed the patient that she has reassuring lab work and she is in agreement to follow-up should her second troponin come back normal.  Thankfully she is already on Eliquis and thus pulmonary embolism is much less likely and there is no signs of pneumothorax.  Change of shift - care signed out to dr. Laverta [place] to follow up results and disposition accordingly.  Final Clinical Impression(s) / ED Diagnoses Final diagnoses:  Chest pain, unspecified type  Noemi Chapel, MD 07/12/20 [phone]

## 2020-07-12 NOTE — Telephone Encounter (Signed)
Checked CMM and PAs are still pending- can take up to 72 hours

## 2020-07-12 NOTE — ED Notes (Signed)
Kathleen Cameron (pt husband) called for update. Please call [phone]

## 2020-07-12 NOTE — Telephone Encounter (Signed)
Message routed to [facility] to look out for PA approval/denial.

## 2020-07-12 NOTE — Telephone Encounter (Signed)
Pt's husband  returning call to office. Please advise    Call back  [phone]

## 2020-07-13 LAB — TROPONIN I (HIGH SENSITIVITY): Troponin I (High Sensitivity): 13 ng/L (ref <18)

## 2020-07-13 MED ORDER — IPRATROPIUM-ALBUTEROL 0.5-2.5 (3) MG/3ML IN SOLN
3.0000 mL | Freq: Once | RESPIRATORY_TRACT | Status: AC
  Administered 2020-07-13: 3 mL via RESPIRATORY_TRACT
  Filled 2020-07-13: qty 3

## 2020-07-13 MED ORDER — MORPHINE SULFATE (PF) 4 MG/ML IV SOLN
4.0000 mg | Freq: Once | INTRAVENOUS | Status: AC
  Administered 2020-07-13: 4 mg via INTRAMUSCULAR
  Filled 2020-07-13: qty 1

## 2020-07-13 NOTE — ED Provider Notes (Signed)
Blood pressure (!) 156/97, pulse 95, resp. rate 16, height 5\' 5"  (1.651 m), weight 62 kg, SpO2 100 %.  Assuming care from Dr. .  In short, Kathleen Cameron is a 55 y.o. female with a chief complaint of Chest Pain and Shortness of Breath .  Refer to the original H&P for additional details.  The current plan of care is to follow up on delta troponin and reassess.  12:49 PM  Second troponin is 13 and WNL. No significantly changed from prior. Plan for d/c with PCP follow up in the coming week. Patient to call office tomorrow AM. Discussed ED return precautions.     57, MD 07/13/20 [phone]

## 2020-07-13 NOTE — ED Notes (Signed)
PTAR 

## 2020-07-13 NOTE — ED Notes (Signed)
PTAR Called 

## 2020-07-13 NOTE — Telephone Encounter (Signed)
Checked CMM for update on Brovana: Outcome Additional Information Required Case cannot be processed electronically. Your request has been received and will be reviewed manually. You will receive a decision via fax within 72 hours.  Called and spoke with pt's husband Earvin Hansen letting him know that we are still waiting outcome on the [place] PA and stated to him once we had an update that we would call them to let them know. He verbalized understanding.

## 2020-07-13 NOTE — ED Notes (Signed)
EDP notified on patient's family's request to speak with him prior to discharge .

## 2020-07-13 NOTE — ED Notes (Signed)
EDP reevaluated patient and explained discharge plan to patient and husband at bedside .

## 2020-07-13 NOTE — ED Notes (Addendum)
Family notified on patient's discharge and [facility] for transport back home .

## 2020-07-13 NOTE — Progress Notes (Signed)
Patient placed back on her home ventilator so she could return home with PTAR.

## 2020-07-13 NOTE — Telephone Encounter (Signed)
Kathleen Cameron husband is checking on medication. Kathleen Cameron phone number is [phone].

## 2020-07-17 NOTE — Telephone Encounter (Signed)
Marchelle Folks, please advise if you have received a fax regarding these PAs? When logged into CMM it could not locate the Pulmicort PA and the [place] PA has to be processed manually and will not populate a decision on CMM.

## 2020-07-18 ENCOUNTER — Telehealth: Payer: Self-pay | Admitting: Emergency Medicine

## 2020-07-18 ENCOUNTER — Other Ambulatory Visit: Payer: Self-pay | Admitting: *Deleted

## 2020-07-18 MED ORDER — VENLAFAXINE HCL 37.5 MG PO TABS: 37.5000 mg | ORAL_TABLET | Freq: Two times a day (BID) | ORAL | Status: DC

## 2020-07-18 MED ORDER — TEMAZEPAM 15 MG PO CAPS: 15.0000 mg | ORAL_CAPSULE | Freq: Every day | ORAL | 0 refills | Status: DC

## 2020-07-18 NOTE — Telephone Encounter (Signed)
Spoke to patient's spouse Earvin Hansen concerning medication refills on Restoril and Effexor prescribed by Dr Alvy Bimler. I checked HIPPA and spouse is not on the form. I had no other choice but to speak with Shaune Spittle. Also, I spoke to my Clinical Supervisor about spouse not on HIPPA .

## 2020-07-18 NOTE — Telephone Encounter (Signed)
1.Medication Requested: temazepam (RESTORIL) 15 MG capsule  venlafaxine (EFFEXOR) 37.5 MG tablet    2. Pharmacy (Name, Street, [place]): [facility] [phone] - [place], [place] - [address]  3. On Med List: yes

## 2020-07-18 NOTE — Telephone Encounter (Signed)
Forwarded prior telephone note to Dr. [name] to continue documentation.

## 2020-07-18 NOTE — Telephone Encounter (Signed)
RB please advise on the change from brovanna to a covered medication per her [facility].  thanks

## 2020-07-18 NOTE — Telephone Encounter (Signed)
Patients husband called again. He is requesting a call back.

## 2020-07-18 NOTE — Telephone Encounter (Signed)
Checked on the [place]. It stated that the PA needed to be submitted manually. I could not find the PA for the [facility]. Will submit this PA request manually as well. Will place the completed forms in the completed PA forms folder after they have been faxed.

## 2020-07-18 NOTE — Telephone Encounter (Signed)
Have we spoken to [facility]? I'm wondering if we need to confirm that this is correct. Also, albuterol is not a good substitute for Brovana., so I will want to trouble shoot or appeal this.

## 2020-07-18 NOTE — Telephone Encounter (Signed)
Follow up message Spouse states pharmacy does NOT have refill request from 3/22  Spouse requesting return call

## 2020-07-18 NOTE — Telephone Encounter (Signed)
Called patient's spouse Earvin Hansen) left message in voice mail to call back concerning medication prescribed at hospital. I checked HIPPA and only patient name on release, also patient is on respiratory and unable to talk.

## 2020-07-18 NOTE — Telephone Encounter (Signed)
Routing to Dr. [name] for [facility]

## 2020-07-18 NOTE — Telephone Encounter (Signed)
Pt husband calling because received something from [facility] statiing that they decided not to approve the [place]- but the letter was addressed to [facility](maiden name). Medicines approved for pt:  Albuterol 0.63mg /54mL solution Albuterol 1.25mg /57mL solution Albuterol sulfate 2.5mg /0.41mL solution Albuterol sulfate 2.5mg /3mg  solution Albuterol sulfate 5mg /mL solution  Please call [phone]

## 2020-07-19 NOTE — Telephone Encounter (Signed)
Pharmacy calling, states they never received the refills

## 2020-07-19 NOTE — Telephone Encounter (Signed)
Thank you :)

## 2020-07-20 ENCOUNTER — Other Ambulatory Visit: Payer: Self-pay | Admitting: *Deleted

## 2020-07-20 ENCOUNTER — Other Ambulatory Visit: Payer: Self-pay | Admitting: Emergency Medicine

## 2020-07-20 MED ORDER — TEMAZEPAM 15 MG PO CAPS: 15.0000 mg | ORAL_CAPSULE | Freq: Every evening | ORAL | 1 refills | Status: DC | PRN

## 2020-07-20 MED ORDER — VENLAFAXINE HCL 37.5 MG PO TABS: 37.5000 mg | ORAL_TABLET | Freq: Two times a day (BID) | ORAL | 1 refills | Status: DC

## 2020-07-20 NOTE — Telephone Encounter (Signed)
Spoke to patient's spouse Earvin Hansen) about the refills for Restoril and Effexor, the pharmacy did not receive them on 3/30/202.  I called the [facility] pharmacy spoke to [facility] (pharmacist). Patient's spouse was called and advised Rx request sent to Dr Alvy Bimler.

## 2020-07-20 NOTE — Telephone Encounter (Signed)
Prescriptions sent to pharmacy of record.  Thanks.

## 2020-07-20 NOTE — Telephone Encounter (Signed)
Dr Alvy Bimler, patient's spouse is calling for refills on Restoril and Effexor. I tried to refill medications on 07/18/2020 and pharmacy has not received it. Patient spouse is very upset refills not at the pharmacy. Please advise, thanks.

## 2020-07-20 NOTE — Telephone Encounter (Signed)
Patients husband calling cussing and yelling because the pharmacy still doesn't have the medication.

## 2020-07-20 NOTE — Telephone Encounter (Signed)
Checked PA faxes up front. Both Rosalyn Gess and Yupelri have been approved by [employer]. There is no end date for the approvals. I called and spoke with Kathleen Cameron. He is aware of the approvals and asked that I call the pharmacy to let them know since they have not contacted them to let them know.   Called [facility] and spoke with the pharmacist. She stated that she was able to process the RXs but had to order the medications. They should be ready for pickup on Monday.   Called Kathleen Cameron again and made him aware of this. He verbalized understanding.   Nothing further needed at time of call.

## 2020-07-21 ENCOUNTER — Emergency Department (HOSPITAL_COMMUNITY): Payer: Medicaid Other

## 2020-07-21 ENCOUNTER — Inpatient Hospital Stay (HOSPITAL_COMMUNITY)
Admission: EM | Admit: 2020-07-21 | Discharge: 2020-07-26 | DRG: 870 | Disposition: A | Discharge status: Home Health | Payer: Medicaid Other | Attending: Internal Medicine | Admitting: Internal Medicine

## 2020-07-21 ENCOUNTER — Inpatient Hospital Stay (HOSPITAL_COMMUNITY): Payer: Medicaid Other

## 2020-07-21 DIAGNOSIS — Z86718 Personal history of other venous thrombosis and embolism: Secondary | ICD-10-CM

## 2020-07-21 DIAGNOSIS — J440 Chronic obstructive pulmonary disease with acute lower respiratory infection: Secondary | ICD-10-CM | POA: Diagnosis present

## 2020-07-21 DIAGNOSIS — E876 Hypokalemia: Secondary | ICD-10-CM | POA: Diagnosis not present

## 2020-07-21 DIAGNOSIS — J690 Pneumonitis due to inhalation of food and vomit: Secondary | ICD-10-CM

## 2020-07-21 DIAGNOSIS — Z87891 Personal history of nicotine dependence: Secondary | ICD-10-CM

## 2020-07-21 DIAGNOSIS — Z20822 Contact with and (suspected) exposure to covid-19: Secondary | ICD-10-CM | POA: Diagnosis present

## 2020-07-21 DIAGNOSIS — T17500A Unspecified foreign body in bronchus causing asphyxiation, initial encounter: Secondary | ICD-10-CM

## 2020-07-21 DIAGNOSIS — Z7189 Other specified counseling: Secondary | ICD-10-CM

## 2020-07-21 DIAGNOSIS — I10 Essential (primary) hypertension: Secondary | ICD-10-CM | POA: Diagnosis present

## 2020-07-21 DIAGNOSIS — Z7901 Long term (current) use of anticoagulants: Secondary | ICD-10-CM

## 2020-07-21 DIAGNOSIS — Z515 Encounter for palliative care: Secondary | ICD-10-CM

## 2020-07-21 DIAGNOSIS — Z9911 Dependence on respirator [ventilator] status: Secondary | ICD-10-CM | POA: Diagnosis not present

## 2020-07-21 DIAGNOSIS — Y95 Nosocomial condition: Secondary | ICD-10-CM

## 2020-07-21 DIAGNOSIS — Z681 Body mass index (BMI) 19 or less, adult: Secondary | ICD-10-CM

## 2020-07-21 DIAGNOSIS — M109 Gout, unspecified: Secondary | ICD-10-CM | POA: Diagnosis present

## 2020-07-21 DIAGNOSIS — Z930 Tracheostomy status: Secondary | ICD-10-CM | POA: Diagnosis not present

## 2020-07-21 DIAGNOSIS — R579 Shock, unspecified: Secondary | ICD-10-CM | POA: Diagnosis present

## 2020-07-21 DIAGNOSIS — D649 Anemia, unspecified: Secondary | ICD-10-CM | POA: Diagnosis present

## 2020-07-21 DIAGNOSIS — G479 Sleep disorder, unspecified: Secondary | ICD-10-CM | POA: Diagnosis present

## 2020-07-21 DIAGNOSIS — E039 Hypothyroidism, unspecified: Secondary | ICD-10-CM | POA: Diagnosis present

## 2020-07-21 DIAGNOSIS — J9621 Acute and chronic respiratory failure with hypoxia: Secondary | ICD-10-CM | POA: Diagnosis present

## 2020-07-21 DIAGNOSIS — K567 Ileus, unspecified: Secondary | ICD-10-CM | POA: Diagnosis not present

## 2020-07-21 DIAGNOSIS — J189 Pneumonia, unspecified organism: Secondary | ICD-10-CM

## 2020-07-21 DIAGNOSIS — F32A Depression, unspecified: Secondary | ICD-10-CM | POA: Diagnosis present

## 2020-07-21 DIAGNOSIS — Z1639 Resistance to other specified antimicrobial drug: Secondary | ICD-10-CM | POA: Diagnosis present

## 2020-07-21 DIAGNOSIS — Z888 Allergy status to other drugs, medicaments and biological substances status: Secondary | ICD-10-CM

## 2020-07-21 DIAGNOSIS — A411 Sepsis due to other specified staphylococcus: Principal | ICD-10-CM | POA: Diagnosis present

## 2020-07-21 DIAGNOSIS — G40909 Epilepsy, unspecified, not intractable, without status epilepticus: Secondary | ICD-10-CM | POA: Diagnosis present

## 2020-07-21 DIAGNOSIS — Z7951 Long term (current) use of inhaled steroids: Secondary | ICD-10-CM

## 2020-07-21 DIAGNOSIS — J410 Simple chronic bronchitis: Secondary | ICD-10-CM | POA: Diagnosis not present

## 2020-07-21 DIAGNOSIS — E440 Moderate protein-calorie malnutrition: Secondary | ICD-10-CM | POA: Diagnosis present

## 2020-07-21 DIAGNOSIS — F419 Anxiety disorder, unspecified: Secondary | ICD-10-CM | POA: Diagnosis present

## 2020-07-21 DIAGNOSIS — R739 Hyperglycemia, unspecified: Secondary | ICD-10-CM | POA: Diagnosis present

## 2020-07-21 DIAGNOSIS — Z4659 Encounter for fitting and adjustment of other gastrointestinal appliance and device: Secondary | ICD-10-CM

## 2020-07-21 DIAGNOSIS — Z79890 Hormone replacement therapy (postmenopausal): Secondary | ICD-10-CM

## 2020-07-21 DIAGNOSIS — Z79899 Other long term (current) drug therapy: Secondary | ICD-10-CM

## 2020-07-21 DIAGNOSIS — E162 Hypoglycemia, unspecified: Secondary | ICD-10-CM | POA: Diagnosis not present

## 2020-07-21 LAB — COMPREHENSIVE METABOLIC PANEL WITH GFR
ALT: 16 U/L (ref 0–44)
AST: 40 U/L (ref 15–41)
Albumin: 2.7 g/dL — ABNORMAL LOW (ref 3.5–5.0)
Alkaline Phosphatase: 77 U/L (ref 38–126)
Anion gap: 5 (ref 5–15)
BUN: 12 mg/dL (ref 6–20)
CO2: 33 mmol/L — ABNORMAL HIGH (ref 22–32)
Calcium: 9.4 mg/dL (ref 8.9–10.3)
Chloride: 103 mmol/L (ref 98–111)
Creatinine, Ser: 0.84 mg/dL (ref 0.44–1.00)
GFR, Estimated: >60 mL/min
Glucose, Bld: 201 mg/dL — ABNORMAL HIGH (ref 70–99)
Potassium: 4.1 mmol/L (ref 3.5–5.1)
Sodium: 141 mmol/L (ref 135–145)
Total Bilirubin: 0.8 mg/dL (ref 0.3–1.2)
Total Protein: 7.3 g/dL (ref 6.5–8.1)

## 2020-07-21 LAB — CBC WITH DIFFERENTIAL/PLATELET
Abs Immature Granulocytes: 0.25 K/uL — ABNORMAL HIGH (ref 0.00–0.07)
Basophils Absolute: 0.1 K/uL (ref 0.0–0.1)
Basophils Relative: 1 %
Eosinophils Absolute: 1.1 K/uL — ABNORMAL HIGH (ref 0.0–0.5)
Eosinophils Relative: 5 %
HCT: 37 % (ref 36.0–46.0)
Hemoglobin: 10.6 g/dL — ABNORMAL LOW (ref 12.0–15.0)
Immature Granulocytes: 1 %
Lymphocytes Relative: 49 %
Lymphs Abs: 11.7 K/uL — ABNORMAL HIGH (ref 0.7–4.0)
MCH: 26.9 pg (ref 26.0–34.0)
MCHC: 28.6 g/dL — ABNORMAL LOW (ref 30.0–36.0)
MCV: 93.9 fL (ref 80.0–100.0)
Monocytes Absolute: 0.9 K/uL (ref 0.1–1.0)
Monocytes Relative: 4 %
Neutro Abs: 9.4 K/uL — ABNORMAL HIGH (ref 1.7–7.7)
Neutrophils Relative %: 40 %
Platelets: 463 K/uL — ABNORMAL HIGH (ref 150–400)
RBC: 3.94 MIL/uL (ref 3.87–5.11)
RDW: 15.9 % — ABNORMAL HIGH (ref 11.5–15.5)
WBC: 23.4 K/uL — ABNORMAL HIGH (ref 4.0–10.5)
nRBC: 0 % (ref 0.0–0.2)

## 2020-07-21 LAB — GLUCOSE, CAPILLARY
Glucose-Capillary: 180 mg/dL — ABNORMAL HIGH (ref 70–99)
Glucose-Capillary: 181 mg/dL — ABNORMAL HIGH (ref 70–99)
Glucose-Capillary: 204 mg/dL — ABNORMAL HIGH (ref 70–99)

## 2020-07-21 LAB — LACTIC ACID, PLASMA: Lactic Acid, Venous: 2.3 mmol/L (ref 0.5–1.9)

## 2020-07-21 LAB — RESP PANEL BY RT-PCR (FLU A&B, COVID) ARPGX2
Influenza A by PCR: NEGATIVE
Influenza B by PCR: NEGATIVE
SARS Coronavirus 2 by RT PCR: NEGATIVE

## 2020-07-21 LAB — URINALYSIS, ROUTINE W REFLEX MICROSCOPIC
Bilirubin Urine: NEGATIVE
Glucose, UA: 50 mg/dL — AB
Hgb urine dipstick: NEGATIVE
Ketones, ur: NEGATIVE mg/dL
Nitrite: NEGATIVE
Protein, ur: 100 mg/dL — AB
Specific Gravity, Urine: 1.009 (ref 1.005–1.030)
pH: 6 (ref 5.0–8.0)

## 2020-07-21 LAB — APTT: aPTT: 36 s (ref 24–36)

## 2020-07-21 LAB — PROCALCITONIN: Procalcitonin: <0.1 ng/mL

## 2020-07-21 LAB — TROPONIN I (HIGH SENSITIVITY)
Troponin I (High Sensitivity): 18 ng/L — ABNORMAL HIGH
Troponin I (High Sensitivity): 131 ng/L (ref <18)

## 2020-07-21 LAB — MRSA PCR SCREENING: MRSA by PCR: NEGATIVE

## 2020-07-21 LAB — PROTIME-INR
INR: 1.6 — ABNORMAL HIGH (ref 0.8–1.2)
Prothrombin Time: 18.5 s — ABNORMAL HIGH (ref 11.4–15.2)

## 2020-07-21 MED ORDER — LACTATED RINGERS IV SOLN: INTRAVENOUS | Status: DC

## 2020-07-21 MED ORDER — FENTANYL CITRATE (PF) 100 MCG/2ML IJ SOLN
50.0000 ug | Freq: Once | INTRAMUSCULAR | Status: AC
  Administered 2020-07-21: 50 ug via INTRAVENOUS
  Filled 2020-07-21: qty 2

## 2020-07-21 MED ORDER — CLONAZEPAM 1 MG PO TABS
1.0000 mg | ORAL_TABLET | Freq: Three times a day (TID) | ORAL | Status: DC
  Administered 2020-07-21: 1 mg
  Filled 2020-07-21: qty 1

## 2020-07-21 MED ORDER — FENTANYL CITRATE (PF) 100 MCG/2ML IJ SOLN
25.0000 ug | INTRAMUSCULAR | Status: DC | PRN
  Administered 2020-07-22 (×2): 25 ug via INTRAVENOUS
  Filled 2020-07-21 (×2): qty 2

## 2020-07-21 MED ORDER — LEVOTHYROXINE SODIUM 25 MCG PO TABS: 50.0000 ug | ORAL_TABLET | Freq: Every day | ORAL | Status: DC

## 2020-07-21 MED ORDER — PANTOPRAZOLE SODIUM 40 MG PO PACK
40.0000 mg | PACK | Freq: Every day | ORAL | Status: DC
  Filled 2020-07-21: qty 20

## 2020-07-21 MED ORDER — BUDESONIDE 0.25 MG/2ML IN SUSP
0.2500 mg | Freq: Two times a day (BID) | RESPIRATORY_TRACT | Status: DC
  Administered 2020-07-21 – 2020-07-25 (×8): 0.25 mg via RESPIRATORY_TRACT
  Filled 2020-07-21 (×9): qty 2

## 2020-07-21 MED ORDER — ALBUTEROL (5 MG/ML) CONTINUOUS INHALATION SOLN
15.0000 mg/h | INHALATION_SOLUTION | Freq: Once | RESPIRATORY_TRACT | Status: AC
  Administered 2020-07-21: 15 mg/h via RESPIRATORY_TRACT
  Filled 2020-07-21: qty 20

## 2020-07-21 MED ORDER — TEMAZEPAM 15 MG PO CAPS: 15.0000 mg | ORAL_CAPSULE | Freq: Every evening | ORAL | Status: DC | PRN

## 2020-07-21 MED ORDER — VANCOMYCIN HCL 1250 MG/250ML IV SOLN
1250.0000 mg | Freq: Once | INTRAVENOUS | Status: AC
  Administered 2020-07-21: 1250 mg via INTRAVENOUS
  Filled 2020-07-21: qty 250

## 2020-07-21 MED ORDER — SODIUM CHLORIDE 0.9 % IV SOLN
2.0000 g | Freq: Once | INTRAVENOUS | Status: AC
  Administered 2020-07-21: 2 g via INTRAVENOUS
  Filled 2020-07-21: qty 2

## 2020-07-21 MED ORDER — DOCUSATE SODIUM 50 MG/5ML PO LIQD
100.0000 mg | Freq: Two times a day (BID) | ORAL | Status: DC | PRN
  Filled 2020-07-21: qty 10

## 2020-07-21 MED ORDER — SODIUM CHLORIDE 0.9 % IV SOLN
2.0000 g | Freq: Two times a day (BID) | INTRAVENOUS | Status: DC
  Administered 2020-07-21 – 2020-07-23 (×5): 2 g via INTRAVENOUS
  Filled 2020-07-21 (×5): qty 2

## 2020-07-21 MED ORDER — ONDANSETRON HCL 4 MG/2ML IJ SOLN: 4.0000 mg | Freq: Four times a day (QID) | INTRAMUSCULAR | Status: DC | PRN

## 2020-07-21 MED ORDER — APIXABAN 5 MG PO TABS: 5.0000 mg | ORAL_TABLET | Freq: Two times a day (BID) | ORAL | Status: DC

## 2020-07-21 MED ORDER — SODIUM CHLORIDE 0.9 % IV BOLUS
500.0000 mL | Freq: Once | INTRAVENOUS | Status: AC
  Administered 2020-07-21: 500 mL via INTRAVENOUS

## 2020-07-21 MED ORDER — ARFORMOTEROL TARTRATE 15 MCG/2ML IN NEBU
15.0000 ug | INHALATION_SOLUTION | Freq: Two times a day (BID) | RESPIRATORY_TRACT | Status: DC
  Administered 2020-07-21 – 2020-07-25 (×8): 15 ug via RESPIRATORY_TRACT
  Filled 2020-07-21 (×10): qty 2

## 2020-07-21 MED ORDER — VENLAFAXINE HCL 37.5 MG PO TABS
37.5000 mg | ORAL_TABLET | Freq: Two times a day (BID) | ORAL | Status: DC
  Administered 2020-07-21: 37.5 mg
  Filled 2020-07-21 (×2): qty 1

## 2020-07-21 MED ORDER — LACTATED RINGERS IV SOLN: INTRAVENOUS | Status: AC

## 2020-07-21 MED ORDER — VANCOMYCIN HCL 1000 MG/200ML IV SOLN: 1000.0000 mg | INTRAVENOUS | Status: DC

## 2020-07-21 MED ORDER — ORAL CARE MOUTH RINSE
15.0000 mL | OROMUCOSAL | Status: DC
  Administered 2020-07-21 – 2020-07-26 (×41): 15 mL via OROMUCOSAL

## 2020-07-21 MED ORDER — ACETAMINOPHEN 325 MG PO TABS
650.0000 mg | ORAL_TABLET | ORAL | Status: DC | PRN
  Administered 2020-07-21: 650 mg
  Filled 2020-07-21: qty 2

## 2020-07-21 MED ORDER — REVEFENACIN 175 MCG/3ML IN SOLN
175.0000 ug | Freq: Every day | RESPIRATORY_TRACT | Status: DC
  Administered 2020-07-21 – 2020-07-22 (×2): 175 ug via RESPIRATORY_TRACT
  Filled 2020-07-21 (×2): qty 3

## 2020-07-21 MED ORDER — SODIUM CHLORIDE 0.9 % IV BOLUS
1000.0000 mL | Freq: Once | INTRAVENOUS | Status: AC
  Administered 2020-07-21: 1000 mL via INTRAVENOUS

## 2020-07-21 MED ORDER — CHLORHEXIDINE GLUCONATE 0.12% ORAL RINSE (MEDLINE KIT)
15.0000 mL | Freq: Two times a day (BID) | OROMUCOSAL | Status: DC
  Administered 2020-07-21 – 2020-07-26 (×9): 15 mL via OROMUCOSAL

## 2020-07-21 MED ORDER — POLYETHYLENE GLYCOL 3350 17 G PO PACK: 17.0000 g | PACK | Freq: Every day | ORAL | Status: DC | PRN

## 2020-07-21 NOTE — H&P (Signed)
NAME:  Kathleen Cameron, MRN:  [id number], DOB:  [birth year], LOS: 0 ADMISSION DATE:  07/21/2020, CONSULTATION DATE:  07/21/2020 REFERRING MD:  Long - EM , CHIEF COMPLAINT:  Hypoxia, AMS  History of Present Illness:  55 yo F PMH COPD, chronic hypoxic respiratory failure on long-tern trach/vent, DVT on eliquis, hx UGIB, gout seizure disorder presents to ED 4/2 from home for AMS and hypoxia, possible respiratory failure. Found unresponsive and hypoxia by home health nurse, was not sure if pt had pulse-- chest compressions started. On EMS arrival pt had pulse. Was difficult to bag, lots of secretions. In ED, suctioned aggressively and bagged with improvement in SpO2. Some blood tinged secretions. Back on vent and mentation has improved. Has become more hypotensive and tachycardic in ED (initial SBP 140s now 80s, HR 60s to 130s) WBC 23. Started on abx with concern for PNA/sepsis.   Recurrent hospital presentations with home ventilation complications (9/24 ED for SOB and chest pain, Admit 3/15- 3/21 after mucus plug, 2/13-3/15 for resp distress on home vent after 2/13 discharge [facility]). It has been recommended that pt dc to vent SNF during previous admissions but patient and husband have declined all destinations except home with [facility].   Pertinent  Medical History  COPD Chronic hypoxic respiratory failure VDRF  Seizure disorder DVT on eliquis Upper GIB Gout  Significant Hospital Events: Including procedures, antibiotic start and stop dates in addition to other pertinent events   . 07/21/20 ED presentation from home-- hypoxia, possible cardiac arrest but less likely. Thick secretions, new leukocytosis, hypotensive. Admit to ICU  Interim History / Subjective:  Mentation and oxygenation have improved after aggressive bagging and pulm hygiene  Objective   Blood pressure (!) 77/60, pulse (!) 123, temperature (!) 96.8 F (36 C), temperature source Rectal, resp. rate 20, height $RemoveBe'5\' 5"'fyjtCUmus$  (1.651 m), weight  62 kg, SpO2 95 %.    Vent Mode: PCV FiO2 (%):  [100 %] 100 % Set Rate:  [22 bmp] 22 bmp Vt Set:  [400 mL] 400 mL PEEP:  [5 cmH20] 5 cmH20 Plateau Pressure:  [38 cmH20] 38 cmH20  No intake or output data in the 24 hours ending 07/21/20 1334 Filed Weights   07/21/20 1315  Weight: 62 kg    Examination: General: chronically ill appearing, trach to vent, trace bloody secretions HENT: dry mucus membranes Lungs: diminished bilaterally, no wheezes or crackles Cardiovascular: tachycardic Abdomen: distended, soft, hypoactive bowel sounds Extremities: thin, no edema Neuro: somnolent, not on sedation, withdraws to pain  Labs/imaging that I havepersonally reviewed  (right click and "Reselect all SmartList Selections" daily)   CXR 4/2> hyperinflation, bilateral opacities, some seen on prior CXR 3/24. Not significantly changed from previous xray.   CMP - grossly unremarkale CBC- WBC 23   LA 2.3  Resolved Hospital Problem list     Assessment & Plan:   Possible cardiac arrest -unclear if pulses were lost. Did receive compressions. If she did have an arrest at this point would suspect it is primarily hypoxemia mediated due to clinical scenario, mucus plugging. P -supportive care, monitor electrolytes, telemetry, EKG.  - monitor mental status. She is following commands intermittently but is weak. Low threshold for head CT if worsening  Acute on chronic respiratory failure with hypoxia, VDRF  -acutely hypoxic 4/2, having increased secretions. Was aggressively suctioned in ED  COPD P -trach care  -cont MV support -send trach aspirate. She is probably colonized with bacteria. Previous culture data reviewed, no prior mrsa or pseudomonas.She  had a large mucus plug when I examined her this afternoon and had low returned tidal volumes on ventilator before I did this. This is a recurrent issue for her at home - patient has presented multiple times at home due to ventilator alarms or  complications in the past few weeks. As discussed by my colleagues previously, I think snf with vent would be more appropriate than home at this time.  -Pulm hygiene  -BDs (home pulmicort brovana yupelri)    Suspected sepsis -WBC 23 from 9 about a week prior to presentation -cxr doesn't look significantly worse from prior, but still has bilateral opacities P -follow up micro data (Trach aspirate, RVP, BCx, UCx ordered) -trend white count and fever curve -empiric abx given decompensation and leukocytosis with hypotension on admit. Can start with vanco/cefepime. Will send procalcitonin.  Hypotension -initial BP in ED WNL but follow ups have been low -? Mechanical failure vs physiologic P -assess NIBP. If needed, BP augmentation. Will rule out sepsis. She is currently improved with IVF resuscitation.  Abdominal distention vs ileus -think this is most likely from being bagged and gastric insufflation.  P -KUB shows air fluid levels. Will hook up G tube to LIWS. Hold tube feeds for now.   DVT - eliquis   Hypothyroidism -synthroid   Seizure disorder  -clonazepam TID   Depression Sleep Disorder -effexor, restoril   Best practice (right click and "Reselect all SmartList Selections" daily)  Diet:  NPO Pain/Anxiety/Delirium protocol (if indicated): Yes (RASS goal 0) VAP protocol (if indicated): Yes DVT prophylaxis: Systemic AC GI prophylaxis: PPI Glucose control:  SSI No Central venous access:  N/A Arterial line:  N/A Foley:  N/A Mobility:  bed rest  PT consulted: N/A Last date of multidisciplinary goals of care discussion $RemoveBeforeD'[]'[id number]$  Code Status:  full code Disposition: ICU  Labs   CBC: Recent Labs  Lab 07/21/20 1207  WBC 23.4*  NEUTROABS PENDING  HGB 10.6*  HCT 37.0  MCV 93.9  PLT 463*    Basic Metabolic Panel: No results for input(s): NA, K, CL, CO2, GLUCOSE, BUN, CREATININE, CALCIUM, MG, PHOS in the last 168 hours. GFR: Estimated Creatinine Clearance: 69.8 mL/min  (by C-G formula based on SCr of 0.58 mg/dL). Recent Labs  Lab 07/21/20 1207  WBC 23.4*    Liver Function Tests: No results for input(s): AST, ALT, ALKPHOS, BILITOT, PROT, ALBUMIN in the last 168 hours. No results for input(s): LIPASE, AMYLASE in the last 168 hours. No results for input(s): AMMONIA in the last 168 hours.  ABG    Component Value Date/Time   PHART 7.386 07/12/2020 2019   PCO2ART 55.8 (H) 07/12/2020 2019   PO2ART 90 07/12/2020 2019   HCO3 33.4 (H) 07/12/2020 2019   TCO2 35 (H) 07/12/2020 2019   O2SAT 96.0 07/12/2020 2019     Coagulation Profile: Recent Labs  Lab 07/21/20 1207  INR 1.6*    Cardiac Enzymes: No results for input(s): CKTOTAL, CKMB, CKMBINDEX, TROPONINI in the last 168 hours.  HbA1C: Hgb A1c MFr Bld  Date/Time Value Ref Range Status  06/21/2020 02:24 AM 5.3 4.8 - 5.6 % Final    Comment:    (NOTE) Pre diabetes:          5.7%-6.4%  Diabetes:              >6.4%  Glycemic control for   <7.0% adults with diabetes   02/26/2020 05:18 PM 5.4 4.8 - 5.6 % Final    Comment:    (  NOTE) Pre diabetes:          5.7%-6.4%  Diabetes:              >6.4%  Glycemic control for   <7.0% adults with diabetes     CBG: No results for input(s): GLUCAP in the last 168 hours.  Review of Systems:   Unable to obtain due to patient condition  Past Medical History:  She,  has a past medical history of Acute on chronic respiratory failure with hypoxia ([place]), Anxiety disorder, Asthma, COPD (chronic obstructive pulmonary disease) ([place]), COPD, severe ([place]), Hypertension, and Seizure disorder ([place]).   Surgical History:   Past Surgical History:  Procedure Laterality Date  . COLONOSCOPY N/A 06/26/2020   Procedure: COLONOSCOPY;  Surgeon: Carol Ada, MD;  Location: [place];  Service: Endoscopy;  Laterality: N/A;  . ENTEROSCOPY N/A 06/26/2020   Procedure: ENTEROSCOPY;  Surgeon: Carol Ada, MD;  Location: [place];  Service: Endoscopy;  Laterality:  N/A;  . HEMOSTASIS CLIP PLACEMENT  06/26/2020   Procedure: HEMOSTASIS CLIP PLACEMENT;  Surgeon: Carol Ada, MD;  Location: [place];  Service: Endoscopy;;  . HOT HEMOSTASIS N/A 06/26/2020   Procedure: HOT HEMOSTASIS (ARGON PLASMA COAGULATION/BICAP);  Surgeon: Carol Ada, MD;  Location: [place];  Service: Endoscopy;  Laterality: N/A;  . IR FLUORO RM 30-60 MIN  03/23/2020  . LAPAROSCOPIC INSERTION GASTROSTOMY TUBE N/A 04/24/2020   Procedure: LAPAROSCOPIC GASTROSTOMY TUBE PLACEMENT;  Surgeon: Kieth Brightly Arta Bruce, MD;  Location: [place];  Service: General;  Laterality: N/A;  . POLYPECTOMY  06/26/2020   Procedure: POLYPECTOMY;  Surgeon: Carol Ada, MD;  Location: [facility] ENDOSCOPY;  Service: Endoscopy;;  . WISDOM TOOTH EXTRACTION       Social History:  She is a former smoker   Family History:  Her Family history is unknown by patient.   Allergies Allergies  Allergen Reactions  . Stiolto Respimat [Tiotropium Bromide-Olodaterol] Other (See Comments)    Severe headaches and rapid heartrate     Home Medications  Prior to Admission medications   Medication Sig Start Date End Date Taking? Authorizing Provider  apixaban (ELIQUIS) 5 MG TABS tablet Place 1 tablet (5 mg total) into feeding tube 2 (two) times daily. 07/02/20 10/30/20 Yes Hall, Carole N, DO  arformoterol (BROVANA) 15 MCG/2ML NEBU Take 2 mLs (15 mcg total) by nebulization 2 (two) times daily. 07/10/20  Yes Candee Furbish, MD  bisacodyl (DULCOLAX) 10 MG suppository Place 1 suppository (10 mg total) rectally daily as needed for severe constipation. 07/10/20  Yes Sagardia, Ines Bloomer, MD  budesonide (PULMICORT) 0.25 MG/2ML nebulizer solution Take 2 mLs (0.25 mg total) by nebulization 2 (two) times daily. 07/10/20  Yes Candee Furbish, MD  Chlorhexidine Gluconate Cloth 2 % PADS Apply 6 each topically daily. 03/09/20  Yes Candee Furbish, MD  chlorhexidine gluconate, MEDLINE KIT, (PERIDEX) 0.12 % solution 15 mLs by Mouth Rinse route 2  (two) times daily. 07/10/20  Yes Candee Furbish, MD  clonazePAM (KLONOPIN) 1 MG tablet Place 1 tablet (1 mg total) into feeding tube 3 (three) times daily. 07/10/20  Yes Sagardia, Ines Bloomer, MD  levothyroxine (SYNTHROID) 50 MCG tablet Take 1 tablet (50 mcg total) by mouth daily at 6 (six) AM. 02/03/20  Yes Charlynne Cousins, MD  lip balm (CARMEX) ointment Apply topically as needed for lip care. 03/08/20  Yes Candee Furbish, MD  Multiple Vitamin (MULTIVITAMIN WITH MINERALS) TABS tablet Place 1 tablet into feeding tube daily. 07/03/20 10/01/20 Yes Irene Pap  N, DO  Nutritional Supplements (ENSURE ACTIVE HIGH PROTEIN) LIQD Take 1 Can by mouth in the morning and at bedtime. 07/09/20  Yes Barb Merino, MD  Nutritional Supplements (FEEDING SUPPLEMENT, OSMOLITE 1.2 CAL,) LIQD Place 1,000 mLs into feeding tube continuous. 07/10/20  Yes Horald Pollen, MD  pantoprazole sodium (PROTONIX) 40 mg/20 mL PACK Place 20 mLs (40 mg total) into feeding tube at bedtime. 07/10/20  Yes Sagardia, Ines Bloomer, MD  phenol (CHLORASEPTIC) 1.4 % LIQD Use as directed 1 spray in the mouth or throat as needed for throat irritation / pain. 03/08/20  Yes Candee Furbish, MD  polyethylene glycol (MIRALAX / GLYCOLAX) 17 g packet Place 17 g into feeding tube daily as needed for mild constipation. 03/08/20  Yes Candee Furbish, MD  revefenacin [facility]) 175 MCG/3ML nebulizer solution Take 3 mLs (175 mcg total) by nebulization daily. 07/10/20  Yes Candee Furbish, MD  rosuvastatin (CRESTOR) 40 MG tablet Place 1 tablet (40 mg total) into feeding tube daily. 07/10/20  Yes Sagardia, Ines Bloomer, MD  senna-docusate (SENOKOT-S) 8.6-50 MG tablet Place 2 tablets into feeding tube at bedtime as needed for moderate constipation. 03/08/20  Yes Candee Furbish, MD  sodium chloride (OCEAN) 0.65 % SOLN nasal spray Place 1 spray into both nostrils as needed for congestion. 03/08/20  Yes Candee Furbish, MD  temazepam (RESTORIL) 15 MG capsule  Place 1 capsule (15 mg total) into feeding tube at bedtime as needed for sleep. 07/20/20  Yes Horald Pollen, MD  venlafaxine [facility]) 37.5 MG tablet Place 1 tablet (37.5 mg total) into feeding tube 2 (two) times daily with a meal. 07/20/20 10/18/20 Yes Sagardia, Ines Bloomer, MD     Critical care time:     The patient is critically ill due to respiratory failure.  Critical care was necessary to treat or prevent imminent or life-threatening deterioration.  Critical care was time spent personally by me on the following activities: development of treatment plan with patient and/or surrogate as well as nursing, discussions with consultants, evaluation of patient's response to treatment, examination of patient, obtaining history from patient or surrogate, ordering and performing treatments and interventions, ordering and review of laboratory studies, ordering and review of radiographic studies, pulse oximetry, re-evaluation of patient's condition and participation in multidisciplinary rounds.   Critical Care Time devoted to patient care services described in this note is 34 minutes. This time reflects time of care of this [place] . This critical care time does not reflect separately billable procedures or procedure time, teaching time or supervisory time of PA/NP/Med student/Med Resident etc but could involve care discussion time.       Spero Geralds [place] [facility] 07/21/2020 2:28 PM  Pager: see AMION  If no response to pager , please call critical care on call (see AMION) until 7pm After 7:00 pm call [place]

## 2020-07-21 NOTE — ED Provider Notes (Signed)
Emergency Department Provider Note   I have reviewed the triage vital signs and the nursing notes.   HISTORY  Chief Complaint No chief complaint on file.   HPI Kathleen Cameron is a 55 y.o. female with complicated past medical history reviewed below including chronic respiratory failure with trach on home vent presents with worsening breathing and question of loss of pulses earlier today.  By report, patient was assessed by her home health nurse this morning and was initially doing well.  She was having no issue with suctioning.  Later in the morning the patient became much more short of breath with increased pressures at home.  The patient became less responsive than apparently lost pulses.  The home health nurse began chest compressions and called 911.  The fire department arrived and confirmed that the patient had pulses and CPR was discontinued.  There was no defibrillation or medications given.  The home health nurse did begin bagging the trach.  No blood today but the nurse reported some trace bleeding from the trach yesterday.  EMS arrived to find the patient with pulses but somewhat difficult to bag.  They gave albuterol and Atrovent along with magnesium and Solu-Medrol.  Level 5 caveat: Respiratory distress and trach dependent.   Past Medical History:  Diagnosis Date  . Acute on chronic respiratory failure with hypoxia ([place])   . Anxiety disorder   . Asthma   . COPD (chronic obstructive pulmonary disease) ([place])   . COPD, severe ([place])   . Hypertension   . Seizure disorder [facility])     Patient Active Problem List   Diagnosis Date Noted  . Acute on chronic respiratory failure with hypoxia ([place]) 07/21/2020  . Acute on chronic respiratory failure with hypercapnia ([place]) 07/03/2020  . Tracheostomy status ([place])   . Sepsis ([place]) 06/03/2020  . Healthcare-associated pneumonia 06/03/2020  . Pressure injury of skin 06/03/2020  . Acute respiratory failure ([place])   . Seizure disorder  ([employer])   . COPD, severe ([place])   . Anxiety disorder   . Generalized abdominal pain   . Convulsions ([place])   . Malnutrition of moderate degree 02/27/2020  . Hypotension   . Acute hypercapnic respiratory failure ([place]) 02/25/2020  . Right upper quadrant abdominal pain 02/01/2020  . Hypothyroidism (acquired) 02/01/2020  . Acute metabolic encephalopathy 81/19/1478  . Hyponatremia 01/28/2020  . Anxiety 12/02/2019  . COPD exacerbation ([place]) 11/06/2019  . Depression 11/06/2019  . Acute on chronic respiratory failure with hypoxia and hypercapnia ([place]) 11/06/2019  . Current moderate episode of major depressive disorder without prior episode ([place]) 09/20/2019  . Pedal edema 08/04/2019  . Chronic respiratory failure with hypoxia ([place]) 03/07/2019  . CKD (chronic kidney disease) 01/05/2019  . COPD (chronic obstructive pulmonary disease) ([place]) 09/29/2018  . Pulmonary nodules 09/27/2018  . Dyslipidemia 07/07/2018  . Essential hypertension 03/10/2018    Past Surgical History:  Procedure Laterality Date  . COLONOSCOPY N/A 06/26/2020   Procedure: COLONOSCOPY;  Surgeon: Carol Ada, MD;  Location: [place];  Service: Endoscopy;  Laterality: N/A;  . ENTEROSCOPY N/A 06/26/2020   Procedure: ENTEROSCOPY;  Surgeon: Carol Ada, MD;  Location: [place];  Service: Endoscopy;  Laterality: N/A;  . HEMOSTASIS CLIP PLACEMENT  06/26/2020   Procedure: HEMOSTASIS CLIP PLACEMENT;  Surgeon: Carol Ada, MD;  Location: [place];  Service: Endoscopy;;  . HOT HEMOSTASIS N/A 06/26/2020   Procedure: HOT HEMOSTASIS (ARGON PLASMA COAGULATION/BICAP);  Surgeon: Carol Ada, MD;  Location: [place];  Service: Endoscopy;  Laterality: N/A;  .  IR FLUORO RM 30-60 MIN  03/23/2020  . LAPAROSCOPIC INSERTION GASTROSTOMY TUBE N/A 04/24/2020   Procedure: LAPAROSCOPIC GASTROSTOMY TUBE PLACEMENT;  Surgeon: Kieth Brightly Arta Bruce, MD;  Location: [place];  Service: General;  Laterality: N/A;  . POLYPECTOMY  06/26/2020   Procedure:  POLYPECTOMY;  Surgeon: Carol Ada, MD;  Location: [facility] ENDOSCOPY;  Service: Endoscopy;;  . WISDOM TOOTH EXTRACTION      Allergies Stiolto respimat [tiotropium bromide-olodaterol]  Family History  Family history unknown: Yes    Social History Social History   Tobacco Use  . Smoking status: Never Smoker  . Smokeless tobacco: Never Used  Vaping Use  . Vaping Use: Never used  Substance Use Topics  . Alcohol use: Yes    Alcohol/week: 2.0 standard drinks    Types: 2 Cans of beer per week  . Drug use: Never    Review of Systems  Level 5 caveat: Respiratory distress   ____________________________________________   PHYSICAL EXAM:  VITAL SIGNS: Vitals:   07/22/20 0500 07/22/20 0600  BP: 104/66 115/73  Pulse: 87 85  Resp: (!) 22 (!) 22  Temp:    SpO2: 97% 100%   Constitutional: Increased respiratory effort.  Eyes: Conjunctivae are normal. Some asymmetry noted in pupils. R 5 mm and L 3 mm Head: Atraumatic. Nose: No congestion/rhinnorhea. Mouth/Throat: Mucous membranes are moist. Neck: No stridor.   Cardiovascular: Normal rate, regular rhythm. Good peripheral circulation. Grossly normal heart sounds.   Respiratory: Normal respiratory effort.  No retractions. Lungs CTAB. Gastrointestinal: Soft and nontender. No distention.  Musculoskeletal: No lower extremity tenderness nor edema. No gross deformities of extremities. Neurologic: Eyes are open with  Skin:  Skin is warm, dry and intact. No rash noted.   ____________________________________________   LABS (all labs ordered are listed, but only abnormal results are displayed)  Labs Reviewed  LACTIC ACID, PLASMA - Abnormal; Notable for the following components:      Result Value   Lactic Acid, Venous 2.3 (*)    All other components within normal limits  COMPREHENSIVE METABOLIC PANEL - Abnormal; Notable for the following components:   CO2 33 (*)    Glucose, Bld 201 (*)    Albumin 2.7 (*)    All other components  within normal limits  CBC WITH DIFFERENTIAL/PLATELET - Abnormal; Notable for the following components:   WBC 23.4 (*)    Hemoglobin 10.6 (*)    MCHC 28.6 (*)    RDW 15.9 (*)    Platelets 463 (*)    Neutro Abs 9.4 (*)    Lymphs Abs 11.7 (*)    Eosinophils Absolute 1.1 (*)    Abs Immature Granulocytes 0.25 (*)    All other components within normal limits  PROTIME-INR - Abnormal; Notable for the following components:   Prothrombin Time 18.5 (*)    INR 1.6 (*)    All other components within normal limits  URINALYSIS, ROUTINE W REFLEX MICROSCOPIC - Abnormal; Notable for the following components:   APPearance HAZY (*)    Glucose, UA 50 (*)    Protein, ur 100 (*)    Leukocytes,Ua MODERATE (*)    Bacteria, UA RARE (*)    All other components within normal limits  GLUCOSE, CAPILLARY - Abnormal; Notable for the following components:   Glucose-Capillary 180 (*)    All other components within normal limits  CBC - Abnormal; Notable for the following components:   WBC 13.1 (*)    RBC 2.98 (*)    Hemoglobin 8.2 (*)  HCT 27.0 (*)    RDW 15.9 (*)    All other components within normal limits  BASIC METABOLIC PANEL - Abnormal; Notable for the following components:   Potassium 2.7 (*)    Glucose, Bld 217 (*)    All other components within normal limits  GLUCOSE, CAPILLARY - Abnormal; Notable for the following components:   Glucose-Capillary 181 (*)    All other components within normal limits  GLUCOSE, CAPILLARY - Abnormal; Notable for the following components:   Glucose-Capillary 204 (*)    All other components within normal limits  LACTIC ACID, PLASMA - Abnormal; Notable for the following components:   Lactic Acid, Venous 4.2 (*)    All other components within normal limits  GLUCOSE, CAPILLARY - Abnormal; Notable for the following components:   Glucose-Capillary 152 (*)    All other components within normal limits  TROPONIN I (HIGH SENSITIVITY) - Abnormal; Notable for the following  components:   Troponin I (High Sensitivity) 18 (*)    All other components within normal limits  TROPONIN I (HIGH SENSITIVITY) - Abnormal; Notable for the following components:   Troponin I (High Sensitivity) 131 (*)    All other components within normal limits  TROPONIN I (HIGH SENSITIVITY) - Abnormal; Notable for the following components:   Troponin I (High Sensitivity) 217 (*)    All other components within normal limits  TROPONIN I (HIGH SENSITIVITY) - Abnormal; Notable for the following components:   Troponin I (High Sensitivity) 179 (*)    All other components within normal limits  RESP PANEL BY RT-PCR (FLU A&B, COVID) ARPGX2  MRSA PCR SCREENING  CULTURE, BLOOD (ROUTINE X 2)  CULTURE, BLOOD (ROUTINE X 2)  URINE CULTURE  CULTURE, RESPIRATORY W GRAM STAIN  CULTURE, RESPIRATORY W GRAM STAIN  APTT  PROCALCITONIN   ____________________________________________  EKG   EKG Interpretation  Date/Time:  Saturday July 21 2020 11:55:37 EDT Ventricular Rate:  77 PR Interval:    QRS Duration: 122 QT Interval:  445 QTC Calculation: 504 R Axis:   234 Text Interpretation: Accelerated junctional rhythm Right bundle branch block Lateral infarct, old Confirmed by Nanda Quinton [phone]) on 07/21/2020 12:32:16 PM       ____________________________________________  RADIOLOGY  DG Abd 1 View  Result Date: 07/21/2020 CLINICAL DATA:  OG tube placement EXAM: ABDOMEN - 1 VIEW COMPARISON:  07/21/2020 FINDINGS: OG tube tip is in the stomach. Decreasing gaseous distention of the stomach. Continued gaseous distention of the colon, likely ileus. IMPRESSION: OG tube in the stomach. Electronically Signed   By: Rolm Baptise M.D.   On: 07/21/2020 19:32   DG Chest Portable 1 View  Result Date: 07/21/2020 CLINICAL DATA:  Respiratory distress. EXAM: PORTABLE CHEST 1 VIEW COMPARISON:  July 12, 2020 FINDINGS: The heart, hila, and mediastinum are unremarkable. A tracheostomy tube projects over the tracheal  air column. No pneumothorax. Diffuse bilateral pulmonary opacities with patchy areas of more confluent opacity in the left perihilar region and right perihilar regions. Hyperinflation of the lungs. No obvious nodules or masses. Blunting of the costophrenic angles could represent small pleural effusions versus pleural thickening. IMPRESSION: Diffuse bilateral patchy pulmonary opacities/infiltrates worrisome for pneumonia. Recommend follow-up imaging to ensure resolution after treatment. Electronically Signed   By: Dorise Bullion III M.D   On: 07/21/2020 12:19   DG Abd Portable 2 Views  Result Date: 07/21/2020 CLINICAL DATA:  55 year old female with abdominal distension and pain EXAM: PORTABLE ABDOMEN - 2 VIEW COMPARISON:  Chest x-ray obtained today;  CT scan abdomen/pelvis 07/03/2020 FINDINGS: Marked gaseous distension of the stomach with positive air-fluid level. A percutaneous gastrostomy tube is noted in place. Endoscopic clips scattered throughout the fecal stream. The remainder of the bowel gas pattern appears nonobstructed. Extensive interstitial and airspace disease as seen earlier today. IMPRESSION: Marked distension of the stomach despite the presence of a percutaneous gastrostomy tube. Consider decompression via the G-tube. Electronically Signed   By: Jacqulynn Cadet M.D.   On: 07/21/2020 13:51    ____________________________________________   PROCEDURES  Procedure(s) performed:   Procedures  CRITICAL CARE Performed by: Margette Fast Total critical care time: 30 minutes Critical care time was exclusive of separately billable procedures and treating other patients. Critical care was necessary to treat or prevent imminent or life-threatening deterioration. Critical care was time spent personally by me on the following activities: development of treatment plan with patient and/or surrogate as well as nursing, discussions with consultants, evaluation of patient's response to treatment,  examination of patient, obtaining history from patient or surrogate, ordering and performing treatments and interventions, ordering and review of laboratory studies, ordering and review of radiographic studies, pulse oximetry and re-evaluation of patient's condition.  Nanda Quinton, MD Emergency Medicine  ____________________________________________   INITIAL IMPRESSION / ASSESSMENT AND PLAN / ED COURSE  Pertinent labs & imaging results that were available during my care of the patient were reviewed by me and considered in my medical decision making (see chart for details).   Patient presents to the emergency department with respiratory distress.  Near absent air movement bilaterally and very high peak pressures.  Upon arrival the patient was suctioned with normal saline and thick, slightly bloody mucus was retrieved.  After several treatments the patient's lung sounds are more normal. Differential includes sepsis, mucous plugging, PNX.   12:40 PM  On reassessment the patient is more awake and alert.  She is shaking her head yes and no to questions.  Feels like she still having some trouble breathing but overall looks much improved.  I initially ordered a CT scan of the head with some pupil asymmetry but with her being much more awake and alert we will cancel for now.   01:55 PM Reviewed abdominal plain film. Vented G tube at bedside.   Discussed patient's case with ICU to request admission. Patient and family (if present) updated with plan. Care transferred to ICU service.  I reviewed all nursing notes, vitals, pertinent old records, EKGs, labs, imaging (as available).  ____________________________________________  FINAL CLINICAL IMPRESSION(S) / ED DIAGNOSES  Final diagnoses:  Acute on chronic respiratory failure with hypoxia (HCC)  Healthcare-associated pneumonia     MEDICATIONS GIVEN DURING THIS VISIT:  Medications  lactated ringers infusion ( Intravenous Stopped 07/22/20 0527)   apixaban (ELIQUIS) tablet 5 mg (5 mg Per Tube Not Given 07/21/20 2136)  arformoterol (BROVANA) nebulizer solution 15 mcg (15 mcg Nebulization Given 07/21/20 2116)  budesonide (PULMICORT) nebulizer solution 0.25 mg (0.25 mg Nebulization Given 07/21/20 2116)  clonazePAM (KLONOPIN) tablet 1 mg (1 mg Per Tube Not Given 07/21/20 2136)  levothyroxine (SYNTHROID) tablet 50 mcg (50 mcg Per Tube Not Given 07/22/20 0528)  pantoprazole sodium (PROTONIX) 40 mg/20 mL oral suspension 40 mg (40 mg Per Tube Not Given 07/21/20 2137)  revefenacin (YUPELRI) nebulizer solution 175 mcg (175 mcg Nebulization Given 07/21/20 1654)  venlafaxine (EFFEXOR) tablet 37.5 mg (37.5 mg Per Tube Given 07/21/20 1742)  temazepam (RESTORIL) capsule 15 mg (has no administration in time range)  acetaminophen (TYLENOL) tablet 650 mg (  650 mg Per Tube Given 07/21/20 1742)  docusate (COLACE) 50 MG/5ML liquid 100 mg (has no administration in time range)  polyethylene glycol (MIRALAX / GLYCOLAX) packet 17 g (has no administration in time range)  ondansetron (ZOFRAN) injection 4 mg (has no administration in time range)  vancomycin (VANCOREADY) IVPB 1000 mg/200 mL (has no administration in time range)  ceFEPIme (MAXIPIME) 2 g in sodium chloride 0.9 % 100 mL IVPB ( Intravenous Stopped 07/21/20 2213)  fentaNYL (SUBLIMAZE) injection 25 mcg (25 mcg Intravenous Given 07/22/20 0424)  chlorhexidine gluconate (MEDLINE KIT) (PERIDEX) 0.12 % solution 15 mL (15 mLs Mouth Rinse Given 07/21/20 1953)  MEDLINE mouth rinse (15 mLs Mouth Rinse Given 07/22/20 0528)  insulin aspart (novoLOG) injection 0-9 Units (2 Units Subcutaneous Given 07/22/20 0402)  Chlorhexidine Gluconate Cloth 2 % PADS 6 each (has no administration in time range)  potassium chloride 10 mEq in 100 mL IVPB (10 mEq Intravenous New Bag/Given 07/22/20 0628)  sodium chloride 0.9 % bolus 500 mL (500 mLs Intravenous New Bag/Given 07/21/20 1310)  albuterol (PROVENTIL,VENTOLIN) solution continuous neb (15 mg/hr Nebulization  Given 07/21/20 1200)  vancomycin (VANCOREADY) IVPB 1250 mg/250 mL (0 mg Intravenous Stopped 07/21/20 1635)  ceFEPIme (MAXIPIME) 2 g in sodium chloride 0.9 % 100 mL IVPB (0 g Intravenous Stopped 07/21/20 1354)  sodium chloride 0.9 % bolus 1,000 mL (1,000 mLs Intravenous New Bag/Given 07/21/20 1308)  fentaNYL (SUBLIMAZE) injection 50 mcg (50 mcg Intravenous Given 07/21/20 1403)    Note:  This document was prepared using Dragon voice recognition software and may include unintentional dictation errors.  Nanda Quinton, MD, [facility] Emergency Medicine    Neeti Knudtson, [facility], MD 07/22/20 [phone]

## 2020-07-21 NOTE — Progress Notes (Signed)
Patient came in via EMS being BVM through her trach, with high resistance on the BVM, placed on vent peak airway pressure being in the mid to high 50's, will continue to monitor patient.

## 2020-07-21 NOTE — ED Triage Notes (Signed)
Patient arrives via [facility] from home.  Reports home health nurse on scene states that alarms on vent started to sound and patients O2 sats dropped, did not know if the patient had a pulse and performed CPR until FD arrived.  Confirmed patient had a pulse.  Discontinued CPR, EMS continue to bag the patient.  Administered 10mg  albuterol, 1mg  atrovent, 125mg  solumedrol, 2g mag sulfate.   Last BP 200/120 HR: 120 EtCo2 96

## 2020-07-21 NOTE — Progress Notes (Signed)
Pharmacy Antibiotic Note  Kathleen Cameron is a 55 y.o. female admitted on 07/21/2020 via EMS with respiratory distress, chronic trach, concern for infection.  Pharmacy has been consulted for vancomycin and cefepime dosing.  SCr 0.84, BL ~0.3-0.4  Plan: Vancomycin 1250 mg IV x 1, then 1000 mg IV every 24h  Cefepime 2g IV every 12 hours Monitor renal function, Cx and clinical progression to narrow Vancomycin levels as indicated   Height: 5\' 5"  (165.1 cm) Weight: 62 kg (136 lb 11 oz) IBW/kg (Calculated) : 57  Temp (24hrs), Avg:96.8 F (36 C), Min:96.8 F (36 C), Max:96.8 F (36 C)  Recent Labs  Lab 07/21/20 1206 07/21/20 1207  WBC  --  23.4*  CREATININE  --  0.84  LATICACIDVEN 2.3*  --     Estimated Creatinine Clearance: 66.5 mL/min (by C-G formula based on SCr of 0.84 mg/dL).    Allergies  Allergen Reactions  . Stiolto Respimat [Tiotropium Bromide-Olodaterol] Other (See Comments)    Severe headaches and rapid heartrate    09/20/20, PharmD Clinical Pharmacist ED Pharmacist Phone # [phone] 07/21/2020 2:20 PM

## 2020-07-22 DIAGNOSIS — J9621 Acute and chronic respiratory failure with hypoxia: Secondary | ICD-10-CM | POA: Diagnosis not present

## 2020-07-22 DIAGNOSIS — Z7189 Other specified counseling: Secondary | ICD-10-CM

## 2020-07-22 DIAGNOSIS — K567 Ileus, unspecified: Secondary | ICD-10-CM

## 2020-07-22 DIAGNOSIS — J690 Pneumonitis due to inhalation of food and vomit: Secondary | ICD-10-CM

## 2020-07-22 DIAGNOSIS — Z515 Encounter for palliative care: Secondary | ICD-10-CM

## 2020-07-22 LAB — TROPONIN I (HIGH SENSITIVITY)
Troponin I (High Sensitivity): 217 ng/L (ref <18)
Troponin I (High Sensitivity): 179 ng/L (ref <18)

## 2020-07-22 LAB — BLOOD CULTURE ID PANEL (REFLEXED) - BCID2

## 2020-07-22 LAB — LACTIC ACID, PLASMA
Lactic Acid, Venous: 4.2 mmol/L (ref 0.5–1.9)
Lactic Acid, Venous: 1.2 mmol/L (ref 0.5–1.9)
Lactic Acid, Venous: 1.1 mmol/L (ref 0.5–1.9)

## 2020-07-22 LAB — GLUCOSE, CAPILLARY
Glucose-Capillary: 152 mg/dL — ABNORMAL HIGH (ref 70–99)
Glucose-Capillary: 75 mg/dL (ref 70–99)
Glucose-Capillary: 76 mg/dL (ref 70–99)
Glucose-Capillary: 85 mg/dL (ref 70–99)
Glucose-Capillary: 79 mg/dL (ref 70–99)
Glucose-Capillary: 82 mg/dL (ref 70–99)

## 2020-07-22 LAB — CBC
HCT: 27 % — ABNORMAL LOW (ref 36.0–46.0)
Hemoglobin: 8.2 g/dL — ABNORMAL LOW (ref 12.0–15.0)
MCH: 27.5 pg (ref 26.0–34.0)
MCHC: 30.4 g/dL (ref 30.0–36.0)
MCV: 90.6 fL (ref 80.0–100.0)
Platelets: 225 10*3/uL (ref 150–400)
RBC: 2.98 MIL/uL — ABNORMAL LOW (ref 3.87–5.11)
RDW: 15.9 % — ABNORMAL HIGH (ref 11.5–15.5)
WBC: 13.1 10*3/uL — ABNORMAL HIGH (ref 4.0–10.5)
nRBC: 0 % (ref 0.0–0.2)

## 2020-07-22 LAB — HEPARIN LEVEL (UNFRACTIONATED): Heparin Unfractionated: 1.06 IU/mL — ABNORMAL HIGH (ref 0.30–0.70)

## 2020-07-22 LAB — APTT: aPTT: 49 seconds — ABNORMAL HIGH (ref 24–36)

## 2020-07-22 MED ORDER — LIDOCAINE 5 % EX PTCH
1.0000 | MEDICATED_PATCH | Freq: Every day | CUTANEOUS | Status: DC | PRN
  Administered 2020-07-22 – 2020-07-26 (×5): 1 via TRANSDERMAL
  Filled 2020-07-22 (×7): qty 1

## 2020-07-22 MED ORDER — FENTANYL CITRATE (PF) 100 MCG/2ML IJ SOLN
25.0000 ug | INTRAMUSCULAR | Status: DC | PRN
Start: 2020-07-22 — End: 2020-07-22
  Administered 2020-07-22: 25 ug via INTRAVENOUS
  Administered 2020-07-22 (×5): 50 ug via INTRAVENOUS
  Filled 2020-07-22 (×6): qty 2

## 2020-07-22 MED ORDER — INSULIN ASPART 100 UNIT/ML ~~LOC~~ SOLN
0.0000 [IU] | SUBCUTANEOUS | Status: DC
  Administered 2020-07-22: 2 [IU] via SUBCUTANEOUS
  Administered 2020-07-22: 3 [IU] via SUBCUTANEOUS

## 2020-07-22 MED ORDER — HEPARIN (PORCINE) 25000 UT/250ML-% IV SOLN
950.0000 [IU]/h | INTRAVENOUS | Status: DC
  Administered 2020-07-22: 800 [IU]/h via INTRAVENOUS
  Filled 2020-07-22: qty 250

## 2020-07-22 MED ORDER — METOCLOPRAMIDE HCL 5 MG/ML IJ SOLN
10.0000 mg | Freq: Three times a day (TID) | INTRAMUSCULAR | Status: DC
  Administered 2020-07-22 – 2020-07-25 (×9): 10 mg via INTRAVENOUS
  Filled 2020-07-22 (×9): qty 2

## 2020-07-22 MED ORDER — CLONAZEPAM 0.5 MG PO TBDP
1.0000 mg | ORAL_TABLET | Freq: Three times a day (TID) | ORAL | Status: DC
  Administered 2020-07-22 – 2020-07-24 (×7): 1 mg via ORAL
  Filled 2020-07-22: qty 2
  Filled 2020-07-22: qty 4
  Filled 2020-07-22 (×5): qty 2

## 2020-07-22 MED ORDER — POTASSIUM CHLORIDE 10 MEQ/100ML IV SOLN
10.0000 meq | INTRAVENOUS | Status: AC
Start: 2020-07-22 — End: 2020-07-22
  Administered 2020-07-22 (×8): 10 meq via INTRAVENOUS
  Filled 2020-07-22 (×4): qty 100

## 2020-07-22 MED ORDER — MAGNESIUM SULFATE 2 GM/50ML IV SOLN
2.0000 g | Freq: Once | INTRAVENOUS | Status: AC
  Administered 2020-07-22: 2 g via INTRAVENOUS
  Filled 2020-07-22: qty 50

## 2020-07-22 MED ORDER — LEVOTHYROXINE SODIUM 100 MCG/5ML IV SOLN
25.0000 ug | Freq: Every day | INTRAVENOUS | Status: DC
  Administered 2020-07-23 – 2020-07-24 (×2): 25 ug via INTRAVENOUS
  Filled 2020-07-22 (×3): qty 5

## 2020-07-22 MED ORDER — PANTOPRAZOLE SODIUM 40 MG IV SOLR
40.0000 mg | Freq: Every day | INTRAVENOUS | Status: DC
  Administered 2020-07-22: 40 mg via INTRAVENOUS
  Filled 2020-07-22: qty 40

## 2020-07-22 MED ORDER — POTASSIUM CHLORIDE 20 MEQ PO PACK
40.0000 meq | PACK | Freq: Once | ORAL | Status: DC
  Filled 2020-07-22: qty 2

## 2020-07-22 MED ORDER — CHLORHEXIDINE GLUCONATE CLOTH 2 % EX PADS
6.0000 | MEDICATED_PAD | Freq: Every day | CUTANEOUS | Status: DC
  Administered 2020-07-22 – 2020-07-26 (×5): 6 via TOPICAL

## 2020-07-22 MED ORDER — FENTANYL CITRATE (PF) 100 MCG/2ML IJ SOLN
25.0000 ug | INTRAMUSCULAR | Status: DC | PRN
Start: 2020-07-22 — End: 2020-07-23
  Administered 2020-07-22 – 2020-07-23 (×2): 75 ug via INTRAVENOUS
  Administered 2020-07-23: 50 ug via INTRAVENOUS
  Filled 2020-07-22 (×3): qty 2

## 2020-07-22 MED ORDER — DEXTROSE 5 % IV SOLN: INTRAVENOUS | Status: DC

## 2020-07-22 MED ORDER — SODIUM CHLORIDE 0.9 % IV SOLN
INTRAVENOUS | Status: DC | PRN
  Administered 2020-07-22 – 2020-07-24 (×2): 500 mL via INTRAVENOUS

## 2020-07-22 MED ORDER — POTASSIUM CHLORIDE 10 MEQ/100ML IV SOLN
10.0000 meq | INTRAVENOUS | Status: DC
  Filled 2020-07-22 (×4): qty 100

## 2020-07-22 NOTE — Progress Notes (Signed)
AM K+ 2.7 with creat 0.97 and GFR > 60. CCM Electrolyte protocol initiated.

## 2020-07-22 NOTE — Progress Notes (Signed)
eLink Physician-Brief Progress Note Patient Name: Kathleen Cameron DOB: 1966/01/14 MRN: [id number]   Date of Service  07/22/2020  HPI/Events of Note  Pharmacy informs me that 1 of 2 BCx is growing S. Epidermidis with methicillin resistance.  Patient is afebrile and hemodynamically stable.   eICU Interventions  This almost certainly represents a contaminant. Will continue to observe closely for now without addition of vancomycin.     Intervention Category Major Interventions: Other:  Janae Bridgeman 07/22/2020, 11:03 PM

## 2020-07-22 NOTE — Consult Note (Signed)
Consultation Note Date: 07/22/2020   Patient Name: Kathleen Cameron  DOB: 02/01/1966  MRN: [id number]  Age / Sex: 55 y.o., female  PCP: Horald Pollen, MD Referring Physician: Candee Furbish, MD  Reason for Consultation: Establishing goals of care  HPI/Patient Profile: 55 y.o. female  with past medical history of COPD, chronic hypoxic respiratory failure with long-term trach and vent dependent, DVT on Eliquis, hx GI bleed, and seizure disorder. She presented to the emergency department on 07/21/2020 from home with AMS and hypoxia, possible respiratory failure. Found unresponsive and hypoxic by home health nurse, was not sure if patient had a pulse - chest compressions started. On EMS arrival, patient had a pulse. Was difficult to bag with copious secretions. In ED, patient was suctioned aggressively and bagged with improvement in Spo2. Placed back on vent and mentation has improved. Also with leukocytosis and hypotension in the ED - concern for aspiration pneumonia. Admitted to PCCM. Hospitalization complicated by severe ileus.   Patient underwent tracheostomy November 2021. She was at [facility] from 11/18//21--06/02/20. Discharged home with home health. Now with recurrent hospital admissions due to home ventilator complications.   Clinical Assessment and Goals of Care: I have reviewed medical records including EPIC notes, labs and imaging, and discussed case with PCCM NP Shirlee Limerick. She discussed with patient this morning the difficulties of such an advanced chronically ill state. Patient expressed to her this morning that she is not happy in her current state and does not feel like this is "her".    I met at bedside with patient  to discuss diagnosis, prognosis, GOC, EOL wishes, disposition, and options. She is able to phonate quite well over her trach, and I also used a dry erase board to facilitate our  discussion.   I introduced Palliative Medicine as specialized medical care for people living with serious illness. It focuses on providing relief from the symptoms and stress of a serious illness.   We discussed a brief life review. Prior to her significant health decline, she worked in the [employer] for 20 years. She tells me she and her husband have been together for "a long time". She has 1 daughter from a previous relationship. She and her husband also have a son and a daughter together. The son currently lives with patient and her husband.    We discussed her current medical condition, and reviewed her trajectory since undergoing tracheostomy in November 2021. I acknowledged and validated that she has been through a lot. She shares that she never anticipated being in this condition and it has been difficult to accept. She shares that she greatly misses being able to go outside, walk around, and take her grandchildren to the park.   I asked if she feels she has good quality of life in her current condition - she states that she does. She shares that she is able to see her grandchildren every day. She expresses that if she is not able to communicate and interact with her family, this would  not be good quality of life for her.  I introduced the concept of a comfort path to patient and encouraged her to think about at what point she would want to stop full scope medical interventions and focus on comfort rather than prolonging life. She asks if comfort care means "death". I clarified that comfort care means managing symptoms of pain and dyspnea while allowing for a natural and peaceful death. She shakes her head no and states "I want to live". I then reframed the discussion based on her conditions for quality of life. Even if she declines to the point she can no longer communicate or interact with family, she would not choose a comfort path. She states again "I want to live".   The patient  is interested in all full scope and life-prolonging medical interventions offered. We did discuss code status. Encouraged patient to consider DNR status understanding evidenced based poor outcomes in similar patients.  I explained that DNR does not change the medical plan and it only comes into effect after a person has arrested (died).  It is a protective measure to keep [place] from harming the patient in their last moments of life. Patient is not agreeable to DNR with understanding that full code includes CPR, defibrillation, and ACLS medications. I provided education that resuscitation events are often unsuccessful but she is clear in her desire to continue full code status.   Primary decision maker: patient   SUMMARY OF RECOMMENDATIONS    Full code   Full scope treatment  Patient clearly declines a comfort path at this time  Patient wishes to continue all life-prolonging measures offered  PMT will continue to follow intermittently, please call [phone] for more urgent needs.  Code Status/Advance Care Planning:  Full code  Symptom Management:   Per PCCM  Palliative Prophylaxis:   Oral Care and Turn Reposition  Additional Recommendations (Limitations, Scope, Preferences):  Full Scope Treatment  Psycho-social/Spiritual:   Created space and opportunity for patient to express thoughts and feelings regarding patient's current medical situation.   Emotional support provided   Prognosis:   Difficult to determine, but poor overall  Discharge Planning: Home with Home Health      Primary Diagnoses: Present on Admission: . Acute on chronic respiratory failure with hypoxia ([place])   I have reviewed the medical record, interviewed the patient and family, and examined the patient. The following aspects are pertinent.  Past Medical History:  Diagnosis Date  . Acute on chronic respiratory failure with hypoxia ([place])   . Anxiety disorder   . Asthma   . COPD (chronic  obstructive pulmonary disease) ([place])   . COPD, severe ([place])   . Hypertension   . Seizure disorder (HCC)    Scheduled Meds: . arformoterol  15 mcg Nebulization BID  . budesonide  0.25 mg Nebulization BID  . chlorhexidine gluconate (MEDLINE KIT)  15 mL Mouth Rinse BID  . Chlorhexidine Gluconate Cloth  6 each Topical Daily  . clonazepam  1 mg Oral TID  . [START ON 07/23/2020] levothyroxine  25 mcg Intravenous Daily  . mouth rinse  15 mL Mouth Rinse 10 times per day  . metoCLOPramide (REGLAN) injection  10 mg Intravenous Q8H  . pantoprazole (PROTONIX) IV  40 mg Intravenous QHS   Continuous Infusions: . sodium chloride Stopped (07/22/20 1522)  . ceFEPime (MAXIPIME) IV Stopped (07/22/20 1427)  . dextrose 25 mL/hr at 07/22/20 1700  . heparin 800 Units/hr (07/22/20 1700)   PRN Meds:.sodium chloride, fentaNYL (SUBLIMAZE)  injection, lidocaine, ondansetron (ZOFRAN) IV, polyethylene glycol Medications Prior to Admission:  Prior to Admission medications   Medication Sig Start Date End Date Taking? Authorizing Provider  apixaban (ELIQUIS) 5 MG TABS tablet Place 1 tablet (5 mg total) into feeding tube 2 (two) times daily. 07/02/20 10/30/20 Yes Hall, Carole N, DO  arformoterol (BROVANA) 15 MCG/2ML NEBU Take 2 mLs (15 mcg total) by nebulization 2 (two) times daily. 07/10/20  Yes Candee Furbish, MD  bisacodyl (DULCOLAX) 10 MG suppository Place 1 suppository (10 mg total) rectally daily as needed for severe constipation. 07/10/20  Yes Sagardia, Ines Bloomer, MD  budesonide (PULMICORT) 0.25 MG/2ML nebulizer solution Take 2 mLs (0.25 mg total) by nebulization 2 (two) times daily. 07/10/20  Yes Candee Furbish, MD  Chlorhexidine Gluconate Cloth 2 % PADS Apply 6 each topically daily. 03/09/20  Yes Candee Furbish, MD  chlorhexidine gluconate, MEDLINE KIT, (PERIDEX) 0.12 % solution 15 mLs by Mouth Rinse route 2 (two) times daily. 07/10/20  Yes Candee Furbish, MD  clonazePAM (KLONOPIN) 1 MG tablet Place 1 tablet (1  mg total) into feeding tube 3 (three) times daily. 07/10/20  Yes Sagardia, Ines Bloomer, MD  levothyroxine (SYNTHROID) 50 MCG tablet Take 1 tablet (50 mcg total) by mouth daily at 6 (six) AM. 02/03/20  Yes Charlynne Cousins, MD  lip balm (CARMEX) ointment Apply topically as needed for lip care. 03/08/20  Yes Candee Furbish, MD  Multiple Vitamin (MULTIVITAMIN WITH MINERALS) TABS tablet Place 1 tablet into feeding tube daily. 07/03/20 10/01/20 Yes Hall, Lorenda Cahill, DO  Nutritional Supplements (ENSURE ACTIVE HIGH PROTEIN) LIQD Take 1 Can by mouth in the morning and at bedtime. 07/09/20  Yes Barb Merino, MD  Nutritional Supplements (FEEDING SUPPLEMENT, OSMOLITE 1.2 CAL,) LIQD Place 1,000 mLs into feeding tube continuous. 07/10/20  Yes Horald Pollen, MD  pantoprazole sodium (PROTONIX) 40 mg/20 mL PACK Place 20 mLs (40 mg total) into feeding tube at bedtime. 07/10/20  Yes Sagardia, Ines Bloomer, MD  phenol (CHLORASEPTIC) 1.4 % LIQD Use as directed 1 spray in the mouth or throat as needed for throat irritation / pain. 03/08/20  Yes Candee Furbish, MD  polyethylene glycol (MIRALAX / GLYCOLAX) 17 g packet Place 17 g into feeding tube daily as needed for mild constipation. 03/08/20  Yes Candee Furbish, MD  revefenacin [facility]) 175 MCG/3ML nebulizer solution Take 3 mLs (175 mcg total) by nebulization daily. 07/10/20  Yes Candee Furbish, MD  rosuvastatin (CRESTOR) 40 MG tablet Place 1 tablet (40 mg total) into feeding tube daily. 07/10/20  Yes Sagardia, Ines Bloomer, MD  senna-docusate (SENOKOT-S) 8.6-50 MG tablet Place 2 tablets into feeding tube at bedtime as needed for moderate constipation. 03/08/20  Yes Candee Furbish, MD  sodium chloride (OCEAN) 0.65 % SOLN nasal spray Place 1 spray into both nostrils as needed for congestion. 03/08/20  Yes Candee Furbish, MD  temazepam (RESTORIL) 15 MG capsule Place 1 capsule (15 mg total) into feeding tube at bedtime as needed for sleep. 07/20/20  Yes Horald Pollen, MD  venlafaxine [facility]) 37.5 MG tablet Place 1 tablet (37.5 mg total) into feeding tube 2 (two) times daily with a meal. 07/20/20 10/18/20 Yes Sagardia, Ines Bloomer, MD  apixaban (ELIQUIS) 5 MG TABS tablet PLACE 1 TABLET (5 MG TOTAL) INTO FEEDING TUBE TWO TIMES DAILY. 07/02/20 07/02/21  Kayleen Memos, DO  oxyCODONE-acetaminophen (PERCOCET/ROXICET) 5-325 MG tablet PLACE 1 TABLET INTO FEEDING TUBE EVERY EIGHT HOURS AS  NEEDED FOR UP TO 3 DAYS FOR SEVERE PAIN. 07/02/20 12/29/20  Kayleen Memos, DO   Allergies  Allergen Reactions  . Stiolto Respimat [Tiotropium Bromide-Olodaterol] Other (See Comments)    Severe headaches and rapid heartrate   Review of Systems  Cardiovascular: Positive for chest pain.    Physical Exam Vitals reviewed.  Constitutional:      General: She is not in acute distress.    Comments: Chronically ill-appearing  Cardiovascular:     Rate and Rhythm: Tachycardia present.  Pulmonary:     Comments: Trach/vent Neurological:     Mental Status: She is alert and oriented to person, place, and time.     Motor: Weakness present.     Vital Signs: BP (!) 165/97   Pulse 83   Temp 98 F (36.7 C) (Oral)   Resp (!) 22   Ht $R'5\' 5"'eB$  (1.651 m)   Wt 53.7 kg   SpO2 92%   BMI 19.70 kg/m  Pain Scale: 0-10   Pain Score: 4    SpO2: SpO2: 92 % O2 Device:SpO2: 92 % O2 Flow Rate: .   IO: Intake/output summary:   Intake/Output Summary (Last 24 hours) at 07/22/2020 1714 Last data filed at 07/22/2020 1705 Gross per 24 hour  Intake 3123.91 ml  Output 1335 ml  Net 1788.91 ml    LBM: Last BM Date:  (PTA) Baseline Weight: Weight: 62 kg Most recent weight: Weight: 53.7 kg      Palliative Assessment/Data: PPS 20% (due to NPO)     Time In: 1530 Time Out: 1642 Time Total: 72 minutes Greater than 50%  of this time was spent counseling and coordinating care related to the above assessment and plan.  Signed by: Lavena Bullion, NP   Please contact Palliative Medicine  Team phone at [phone] for questions and concerns.  For individual provider: See Shea Evans

## 2020-07-22 NOTE — Progress Notes (Addendum)
ANTICOAGULATION CONSULT NOTE - Initial Consult  Pharmacy Consult for Heparin Indication: atrial fibrillation  Allergies  Allergen Reactions  . Stiolto Respimat [Tiotropium Bromide-Olodaterol] Other (See Comments)    Severe headaches and rapid heartrate    Patient Measurements: Height: 5\' 5"  (165.1 cm) Weight: 53.7 kg (118 lb 6.2 oz) IBW/kg (Calculated) : 57  Vital Signs: Temp: 98.1 F (36.7 C) (04/03 0743) Temp Source: Axillary (04/03 0743) BP: 114/71 (04/03 0700) Pulse Rate: 89 (04/03 0743)  Labs: Recent Labs    07/21/20 1207 07/21/20 1427 07/21/20 2219 07/22/20 0047 07/22/20 0513  HGB 10.6*  --   --  8.2*  --   HCT 37.0  --   --  27.0*  --   PLT 463*  --   --  225  --   APTT 36  --   --   --   --   LABPROT 18.5*  --   --   --   --   INR 1.6*  --   --   --   --   CREATININE 0.84  --   --  0.97  --   TROPONINIHS 18* 131* 217*  --  179*    Estimated Creatinine Clearance: 54.2 mL/min (by C-G formula based on SCr of 0.97 mg/dL).   Medical History: Past Medical History:  Diagnosis Date  . Acute on chronic respiratory failure with hypoxia (HCC)   . Anxiety disorder   . Asthma   . COPD (chronic obstructive pulmonary disease) (HCC)   . COPD, severe (HCC)   . Hypertension   . Seizure disorder [facility])     Assessment: 55 yo trach patient on chronic home vent. Here secondary to resp failure and pneumonia. Patient on Eliquis as an outpatient for atrial fibrillation. Pharmacy consulted to start Heparin drip.  Will monitor aPTT for the first few days until Eliquis is no longer influencing the heparin level.  Goal of Therapy:  Heparin level 0.3-0.7 units/ml  APTT 66 - 102 secs Monitor platelets by anticoagulation protocol: Yes   Plan:  Start heparin infusion at 800 units/hr Check aPTT in 8 hours and daily with a daily heparin level while on heparin Continue to monitor H&H and platelets  56, PharmD, [facility] Clinical Pharmacist Please see AMION for all  Pharmacists' Contact Phone Numbers 07/22/2020, 9:47 AM

## 2020-07-22 NOTE — Progress Notes (Signed)
eLink Physician-Brief Progress Note Patient Name: Kathleen Cameron DOB: [birth year] MRN: [id number]   Date of Service  07/22/2020  HPI/Events of Note  Pt c/o significant pain despite 25-92mcg IV fentanyl Q1H PRN.  Patient is on ventilator via trach.   eICU Interventions  Increased dose range of fentanyl to 25-75 mcg IV Q1H PRN.     Intervention Category Intermediate Interventions: Pain - evaluation and management  Kathleen Cameron 07/22/2020, 10:33 PM

## 2020-07-22 NOTE — Progress Notes (Signed)
ANTICOAGULATION CONSULT NOTE   Pharmacy Consult for Heparin Indication: atrial fibrillation  Allergies  Allergen Reactions  . Stiolto Respimat [Tiotropium Bromide-Olodaterol] Other (See Comments)    Severe headaches and rapid heartrate    Patient Measurements: Height: 5\' 5"  (165.1 cm) Weight: 53.7 kg (118 lb 6.2 oz) IBW/kg (Calculated) : 57  Vital Signs: Temp: 98.3 F (36.8 C) (04/03 1938) Temp Source: Oral (04/03 1938) BP: 153/97 (04/03 1800) Pulse Rate: 73 (04/03 1800)  Labs: Recent Labs    07/21/20 1207 07/21/20 1427 07/21/20 2219 07/22/20 0047 07/22/20 0513 07/22/20 1907  HGB 10.6*  --   --  8.2*  --   --   HCT 37.0  --   --  27.0*  --   --   PLT 463*  --   --  225  --   --   APTT 36  --   --   --   --  49*  LABPROT 18.5*  --   --   --   --   --   INR 1.6*  --   --   --   --   --   HEPARINUNFRC  --   --   --   --   --  1.06*  CREATININE 0.84  --   --  0.97  --   --   TROPONINIHS 18* 131* 217*  --  179*  --     Estimated Creatinine Clearance: 54.2 mL/min (by C-G formula based on SCr of 0.97 mg/dL).   Medical History: Past Medical History:  Diagnosis Date  . Acute on chronic respiratory failure with hypoxia (HCC)   . Anxiety disorder   . Asthma   . COPD (chronic obstructive pulmonary disease) (HCC)   . COPD, severe (HCC)   . Hypertension   . Seizure disorder [facility])     Assessment: 55 yo trach patient on chronic home vent. Here secondary to resp failure and pneumonia. Patient on Eliquis as an outpatient for atrial fibrillation. Pharmacy consulted to start Heparin drip.  Will monitor aPTT for the first few days until Eliquis is no longer influencing the heparin level.  Heparin level 1.0 elevated as somewhat expected given recent apixaban dosing. Aptt is below goal at 49s. No bleeding issues noted.   Goal of Therapy:  Heparin level 0.3-0.7 units/ml  APTT 66 - 102 secs Monitor platelets by anticoagulation protocol: Yes   Plan:  Increase heparin  infusion to 950 units/hr Recheck aPTT in 6 hours and daily with a daily heparin level while on heparin Continue to monitor H&H and platelets  56 PharmD., BCPS Clinical Pharmacist 07/22/2020 8:44 PM

## 2020-07-22 NOTE — Progress Notes (Signed)
eLink Physician-Brief Progress Note Patient Name: Kathleen Cameron DOB: 1966/01/13 MRN: [id number]   Date of Service  07/22/2020  HPI/Events of Note  Pt has Q4 CBGs ordered but no SSI. Last 2 CBGs 181, 205. No history of DM.  eICU Interventions  Sensitive SSI q4 hr ordered     Intervention Category Intermediate Interventions: Hyperglycemia - evaluation and treatment  [name] 07/22/2020, 12:13 AM

## 2020-07-22 NOTE — Progress Notes (Signed)
PHARMACY - PHYSICIAN COMMUNICATION CRITICAL VALUE ALERT - BLOOD CULTURE IDENTIFICATION (BCID)  Kathleen Cameron is an 55 y.o. female who presented to [facility] on 07/21/2020 with a chief complaint of OOH cardiac arrest  Assessment:  1/2 BC with Staph epi, methicillin resistance, ? contaminant Name of physician (or Provider) Contacted: Dr Benjamin Stain  Current antibiotics: cefepime  Changes to prescribed antibiotics recommended:  None at this time  Results for orders placed or performed during the hospital encounter of 07/21/20  Blood Culture ID Panel (Reflexed) (Collected: 07/21/2020 12:07 PM)  Result Value Ref Range   Enterococcus faecalis NOT DETECTED NOT DETECTED   Enterococcus Faecium NOT DETECTED NOT DETECTED   Listeria monocytogenes NOT DETECTED NOT DETECTED   Staphylococcus species DETECTED (A) NOT DETECTED   Staphylococcus aureus (BCID) NOT DETECTED NOT DETECTED   Staphylococcus epidermidis DETECTED (A) NOT DETECTED   Staphylococcus lugdunensis NOT DETECTED NOT DETECTED   Streptococcus species NOT DETECTED NOT DETECTED   Streptococcus agalactiae NOT DETECTED NOT DETECTED   Streptococcus pneumoniae NOT DETECTED NOT DETECTED   Streptococcus pyogenes NOT DETECTED NOT DETECTED   A.calcoaceticus-baumannii NOT DETECTED NOT DETECTED   Bacteroides fragilis NOT DETECTED NOT DETECTED   Enterobacterales NOT DETECTED NOT DETECTED   Enterobacter cloacae complex NOT DETECTED NOT DETECTED   Escherichia coli NOT DETECTED NOT DETECTED   Klebsiella aerogenes NOT DETECTED NOT DETECTED   Klebsiella oxytoca NOT DETECTED NOT DETECTED   Klebsiella pneumoniae NOT DETECTED NOT DETECTED   Proteus species NOT DETECTED NOT DETECTED   Salmonella species NOT DETECTED NOT DETECTED   Serratia marcescens NOT DETECTED NOT DETECTED   Haemophilus influenzae NOT DETECTED NOT DETECTED   Neisseria meningitidis NOT DETECTED NOT DETECTED   Pseudomonas aeruginosa NOT DETECTED NOT DETECTED   Stenotrophomonas  maltophilia NOT DETECTED NOT DETECTED   Candida albicans NOT DETECTED NOT DETECTED   Candida auris NOT DETECTED NOT DETECTED   Candida glabrata NOT DETECTED NOT DETECTED   Candida krusei NOT DETECTED NOT DETECTED   Candida parapsilosis NOT DETECTED NOT DETECTED   Candida tropicalis NOT DETECTED NOT DETECTED   Cryptococcus neoformans/gattii NOT DETECTED NOT DETECTED   Methicillin resistance mecA/C DETECTED (A) NOT DETECTED    Talbert Cage Poteet 07/22/2020  11:01 PM

## 2020-07-22 NOTE — Progress Notes (Signed)
NAME:  Kathleen Cameron, MRN:  [id number], DOB:  Oct 29, 1965, LOS: 1 ADMISSION DATE:  07/21/2020, CONSULTATION DATE:  07/21/2020 REFERRING MD:  Long - EM , CHIEF COMPLAINT:  Hypoxia, AMS  History of Present Illness:  55 yo F PMH COPD, chronic hypoxic respiratory failure on long-tern trach/vent, DVT on eliquis, hx UGIB, gout seizure disorder presents to ED 4/2 from home for AMS and hypoxia, possible respiratory failure. Found unresponsive and hypoxia by home health nurse, was not sure if pt had pulse-- chest compressions started. On EMS arrival pt had pulse. Was difficult to bag, lots of secretions. In ED, suctioned aggressively and bagged with improvement in SpO2. Some blood tinged secretions. Back on vent and mentation has improved. Has become more hypotensive and tachycardic in ED (initial SBP 140s now 80s, HR 60s to 130s) WBC 23. Started on abx with concern for PNA/sepsis.   Recurrent hospital presentations with home ventilation complications (3/25 ED for SOB and chest pain, Admit 3/15- 3/21 after mucus plug, 2/13-3/15 for resp distress on home vent after 2/13 [facility] from [facility]). It has been recommended that pt dc to vent SNF during previous admissions but patient and husband have declined all destinations except home with [facility].   Pertinent  Medical History  COPD Chronic hypoxic respiratory failure VDRF  Seizure disorder DVT on eliquis Upper GIB Gout  Significant Hospital Events: Including procedures, antibiotic start and stop dates in addition to other pertinent events   . 07/21/20 ED presentation from home-- hypoxia, possible cardiac arrest but less likely. Thick secretions, new leukocytosis, hypotensive. Admit to ICU . 07/22/20 narrowing abx to just cefepime. Adding reglan for ileus. CCM started GOC conversations and will consult palliative care   Interim History / Subjective:  Feels like belly pain is better  Feels defeated that she has required another ICU admission, tearful.    Objective   Blood pressure 114/71, pulse 89, temperature 98.1 F (36.7 C), temperature source Axillary, resp. rate (!) 22, height 5\' 5"  (1.651 m), weight 53.7 kg, SpO2 100 %.    Vent Mode: PRVC FiO2 (%):  [40 %-100 %] 60 % Set Rate:  [22 bmp] 22 bmp Vt Set:  [400 mL-450 mL] 450 mL PEEP:  [5 cmH20] 5 cmH20 Plateau Pressure:  [11 cmH20-38 cmH20] 11 cmH20   Intake/Output Summary (Last 24 hours) at 07/22/2020 0829 Last data filed at 07/22/2020 09/21/2020 Gross per 24 hour  Intake 2187.39 ml  Output 860 ml  Net 1327.39 ml   Filed Weights   07/21/20 1315 07/22/20 0500  Weight: 62 kg 53.7 kg    Examination: General: chronically ill, frail adult F appears older than stated age, supine in bed trach/vent  HENT: NCAT poor dentition, trach secure anicteric sclera  Lungs: Diminished bibasilar sounds. Symmetrical chest expansion, few scattered rhonchi  Cardiovascular: rrr s1s2 cap refill brisk 2+ peripheral pulses  Abdomen: Distended soft non-tender. Hypoactive sounds x4 Extremities: Symmetrically decreased muscle bulk. No acute joint deformity, no cyanosis  Neuro: Awake, alert oriented x3 following commands. Fine tremor   Labs/imaging that I havepersonally reviewed  (right click and "Reselect all SmartList Selections" daily)   CXR 4/2 >bilateral opacities similar to prior KUB 4/2> likely ileus   K 2.7  WBC down to 13  LA 4.2   Resolved Hospital Problem list   Hypotension   Assessment & Plan:   Possible OOH cardiac arrest vs possible respiratory arrest -unclear if pulses were lost. Did receive compressions. If she did have an arrest at this point  would suspect it is primarily hypoxemia mediated due to clinical scenario, mucus plugging. P -supportive care, pt mentation at baseline   Acute on chronic respiratory failure with hypoxia  VDRF  Aspiration PNA  -acutely hypoxic 4/2, having increased secretions. Was aggressively suctioned in ED  COPD P -trach care  -cont MV  support -cont cefepime  -dc vanc (mrsa neg) -pulmicort, brovana   -holding home yupelri (4/3)   Ileus  -NPO -reglan  -NGT to suction   Hypokalemia -being replaced -will also give 2g mag  -follow labs and replace as needed   DVT  - changing eliquis to heparin gtt, appreciate pharmacist dosing  Hypothyroidism -synthroid (changing to IV)   Seizure disorder -clonazepam TID (changing to ODT)  Depression Sleep Disorder -have to hold home restoril and effexor with ileus (restart when able) -can add qHS support for sleep if needed   Goals of Care 4/3 -long tearful conversation with Annice Pih about the difficulties with such a challenging and advanced chronically ill state -talked about the safety benefits of considering a vent SNF -- it is clear that a facility of any sort is not acceptable (emotionally traumatic stay at [facility]). Talked about my concerns with care at home (risk of respiratory arrest, cardiac arrest, mucus plugging, GI/nutrition concerns, deconditioning, skin breakdown etc) -we talked about the the considerations between quantity of time vs quality of life, and the difficulty with navigating such trying chronic clinical conditions. She is admittedly miserable living in this state, and does not feel like this is "her." Annice Pih has not had these discussions with her husband (it seems like there is a little fear of seeming like she would be giving up if quality were prioritized over quality and she does not know how to approach these talks with him) -discussed the purpose and utility of Palliative Medicine -- she is open to exploring this, I will place a PCM consult and appreciate their help in guiding GOC conversations and helping to guide difficult family discussions   Best practice (right click and "Reselect all SmartList Selections" daily)  Diet:  NPO Pain/Anxiety/Delirium protocol (if indicated): PRN fentanyl VAP protocol (if indicated): Yes DVT prophylaxis: Systemic  AC GI prophylaxis: PPI Glucose control:  SSI No - adding low dose d5 gtt  Central venous access:  N/A Arterial line:  N/A Foley:  N/A Mobility:  bed rest  PT consulted: N/A Last date of multidisciplinary goals of care discussion [ CCM NP Delorise Shiner spoke with pt 4/3] Code Status:  full code Disposition: ICU  Labs   CBC: Recent Labs  Lab 07/21/20 1207 07/22/20 0047  WBC 23.4* 13.1*  NEUTROABS 9.4*  --   HGB 10.6* 8.2*  HCT 37.0 27.0*  MCV 93.9 90.6  PLT 463* 225    Basic Metabolic Panel: Recent Labs  Lab 07/21/20 1207 07/22/20 0047  NA 141 144  K 4.1 2.7*  CL 103 105  CO2 33* 27  GLUCOSE 201* 217*  BUN 12 16  CREATININE 0.84 0.97  CALCIUM 9.4 8.9   GFR: Estimated Creatinine Clearance: 54.2 mL/min (by C-G formula based on SCr of 0.97 mg/dL). Recent Labs  Lab 07/21/20 1206 07/21/20 1207 07/21/20 1427 07/22/20 0047  PROCALCITON  --   --  <0.10  --   WBC  --  23.4*  --  13.1*  LATICACIDVEN 2.3*  --   --  4.2*    Liver Function Tests: Recent Labs  Lab 07/21/20 1207  AST 40  ALT 16  ALKPHOS 77  BILITOT 0.8  PROT 7.3  ALBUMIN 2.7*   No results for input(s): LIPASE, AMYLASE in the last 168 hours. No results for input(s): AMMONIA in the last 168 hours.  ABG    Component Value Date/Time   PHART 7.386 07/12/2020 2019   PCO2ART 55.8 (H) 07/12/2020 2019   PO2ART 90 07/12/2020 2019   HCO3 33.4 (H) 07/12/2020 2019   TCO2 35 (H) 07/12/2020 2019   O2SAT 96.0 07/12/2020 2019     Coagulation Profile: Recent Labs  Lab 07/21/20 1207  INR 1.6*    Cardiac Enzymes: No results for input(s): CKTOTAL, CKMB, CKMBINDEX, TROPONINI in the last 168 hours.  HbA1C: Hgb A1c MFr Bld  Date/Time Value Ref Range Status  06/21/2020 02:24 AM 5.3 4.8 - 5.6 % Final    Comment:    (NOTE) Pre diabetes:          5.7%-6.4%  Diabetes:              >6.4%  Glycemic control for   <7.0% adults with diabetes   02/26/2020 05:18 PM 5.4 4.8 - 5.6 % Final    Comment:     (NOTE) Pre diabetes:          5.7%-6.4%  Diabetes:              >6.4%  Glycemic control for   <7.0% adults with diabetes     CBG: Recent Labs  Lab 07/21/20 1604 07/21/20 1930 07/21/20 2343 07/22/20 0331 07/22/20 0820  GLUCAP 180* 181* 204* 152* 75   CRITICAL CARE Performed by: Lanier Clam  Total critical care time: 48 minutes  Critical care time was exclusive of separately billable procedures and treating other patients. Critical care was necessary to treat or prevent imminent or life-threatening deterioration.  Critical care was time spent personally by me on the following activities: development of treatment plan with patient and/or surrogate as well as nursing, discussions with consultants, evaluation of patient's response to treatment, examination of patient, obtaining history from patient or surrogate, ordering and performing treatments and interventions, ordering and review of laboratory studies, ordering and review of radiographic studies, pulse oximetry and re-evaluation of patient's condition.   Tessie Fass MSN, AGACNP-BC [facility] [phone] If no answer, [phone] 07/22/2020, 9:42 AM

## 2020-07-22 NOTE — Progress Notes (Signed)
Date and time results received: 07/22/20 3:17 AM   Test: Potassium Critical Value: 2.7  Name of Provider Notified: E-link  Orders Received? Or Actions Taken?: Orders Received - See Orders for details - Awaiting orders

## 2020-07-23 ENCOUNTER — Telehealth: Payer: Self-pay | Admitting: Emergency Medicine

## 2020-07-23 DIAGNOSIS — J9621 Acute and chronic respiratory failure with hypoxia: Secondary | ICD-10-CM | POA: Diagnosis not present

## 2020-07-23 LAB — MAGNESIUM: Magnesium: 2 mg/dL (ref 1.7–2.4)

## 2020-07-23 LAB — GLUCOSE, CAPILLARY
Glucose-Capillary: 109 mg/dL — ABNORMAL HIGH (ref 70–99)
Glucose-Capillary: 141 mg/dL — ABNORMAL HIGH (ref 70–99)
Glucose-Capillary: 73 mg/dL (ref 70–99)
Glucose-Capillary: 89 mg/dL (ref 70–99)
Glucose-Capillary: 92 mg/dL (ref 70–99)
Glucose-Capillary: 97 mg/dL (ref 70–99)

## 2020-07-23 LAB — PREPARE RBC (CROSSMATCH)

## 2020-07-23 LAB — BASIC METABOLIC PANEL
Anion gap: 5 (ref 5–15)
Anion gap: 8 (ref 5–15)
BUN: 16 mg/dL (ref 6–20)
BUN: 15 mg/dL (ref 6–20)
CO2: 32 mmol/L (ref 22–32)
CO2: 28 mmol/L (ref 22–32)
Calcium: 9.1 mg/dL (ref 8.9–10.3)
Calcium: 9.3 mg/dL (ref 8.9–10.3)
Chloride: 103 mmol/L (ref 98–111)
Chloride: 103 mmol/L (ref 98–111)
Creatinine, Ser: 0.71 mg/dL (ref 0.44–1.00)
Creatinine, Ser: 0.71 mg/dL (ref 0.44–1.00)
GFR, Estimated: >60 mL/min (ref >60)
GFR, Estimated: >60 mL/min (ref >60)
Glucose, Bld: 82 mg/dL (ref 70–99)
Glucose, Bld: 78 mg/dL (ref 70–99)
Potassium: 4.1 mmol/L (ref 3.5–5.1)
Potassium: 4.1 mmol/L (ref 3.5–5.1)
Sodium: 140 mmol/L (ref 135–145)
Sodium: 139 mmol/L (ref 135–145)

## 2020-07-23 LAB — CBC
HCT: 22.3 % — ABNORMAL LOW (ref 36.0–46.0)
HCT: 27.2 % — ABNORMAL LOW (ref 36.0–46.0)
Hemoglobin: 6.9 g/dL — CL (ref 12.0–15.0)
Hemoglobin: 8.6 g/dL — ABNORMAL LOW (ref 12.0–15.0)
MCH: 27.4 pg (ref 26.0–34.0)
MCH: 27.5 pg (ref 26.0–34.0)
MCHC: 30.9 g/dL (ref 30.0–36.0)
MCHC: 31.6 g/dL (ref 30.0–36.0)
MCV: 88.5 fL (ref 80.0–100.0)
MCV: 86.9 fL (ref 80.0–100.0)
Platelets: 201 10*3/uL (ref 150–400)
Platelets: 230 10*3/uL (ref 150–400)
RBC: 2.52 MIL/uL — ABNORMAL LOW (ref 3.87–5.11)
RBC: 3.13 MIL/uL — ABNORMAL LOW (ref 3.87–5.11)
RDW: 15.9 % — ABNORMAL HIGH (ref 11.5–15.5)
RDW: 15.7 % — ABNORMAL HIGH (ref 11.5–15.5)
WBC: 10.6 10*3/uL — ABNORMAL HIGH (ref 4.0–10.5)
WBC: 11.6 10*3/uL — ABNORMAL HIGH (ref 4.0–10.5)
nRBC: 0 % (ref 0.0–0.2)
nRBC: 0 % (ref 0.0–0.2)

## 2020-07-23 LAB — URINE CULTURE: Culture: NO GROWTH

## 2020-07-23 LAB — APTT: aPTT: 125 seconds — ABNORMAL HIGH (ref 24–36)

## 2020-07-23 LAB — HEPARIN LEVEL (UNFRACTIONATED): Heparin Unfractionated: 1.04 IU/mL — ABNORMAL HIGH (ref 0.30–0.70)

## 2020-07-23 MED ORDER — BISACODYL 10 MG RE SUPP
10.0000 mg | Freq: Every day | RECTAL | Status: DC
  Administered 2020-07-23: 10 mg via RECTAL
  Filled 2020-07-23 (×2): qty 1

## 2020-07-23 MED ORDER — HEPARIN (PORCINE) 25000 UT/250ML-% IV SOLN
800.0000 [IU]/h | INTRAVENOUS | Status: DC
  Administered 2020-07-23: 800 [IU]/h via INTRAVENOUS
  Filled 2020-07-23: qty 250

## 2020-07-23 MED ORDER — IPRATROPIUM-ALBUTEROL 0.5-2.5 (3) MG/3ML IN SOLN
3.0000 mL | Freq: Four times a day (QID) | RESPIRATORY_TRACT | Status: DC
  Administered 2020-07-23 – 2020-07-26 (×11): 3 mL via RESPIRATORY_TRACT
  Filled 2020-07-23 (×11): qty 3

## 2020-07-23 MED ORDER — DEXTROSE 10 % IV SOLN: INTRAVENOUS | Status: DC

## 2020-07-23 MED ORDER — PANTOPRAZOLE SODIUM 40 MG IV SOLR
40.0000 mg | Freq: Two times a day (BID) | INTRAVENOUS | Status: DC
  Administered 2020-07-23 – 2020-07-24 (×4): 40 mg via INTRAVENOUS
  Filled 2020-07-23 (×4): qty 40

## 2020-07-23 MED ORDER — HYDROMORPHONE HCL 1 MG/ML IJ SOLN
1.0000 mg | INTRAMUSCULAR | Status: DC | PRN
  Administered 2020-07-23 – 2020-07-24 (×3): 1 mg via INTRAVENOUS
  Filled 2020-07-23 (×3): qty 1

## 2020-07-23 MED ORDER — WHITE PETROLATUM EX OINT
TOPICAL_OINTMENT | CUTANEOUS | Status: AC
  Administered 2020-07-23: 0.2
  Filled 2020-07-23: qty 28.35

## 2020-07-23 MED ORDER — SODIUM CHLORIDE 0.9% IV SOLUTION: Freq: Once | INTRAVENOUS | Status: AC

## 2020-07-23 NOTE — Telephone Encounter (Signed)
Kathleen Cameron called and was wondering if the order osmolite 1.5 could be re faxed. Please advise   Fax: [phone] Phone: [phone]

## 2020-07-23 NOTE — Progress Notes (Signed)
ANTICOAGULATION CONSULT NOTE - Follow Up Consult  Pharmacy Consult for heparin Indication: atrial fibrillation  Labs: Recent Labs    07/21/20 1207 07/21/20 1427 07/21/20 2219 07/22/20 0047 07/22/20 0513 07/22/20 1907 07/23/20 0253  HGB 10.6*  --   --  8.2*  --   --  6.9*  HCT 37.0  --   --  27.0*  --   --  22.3*  PLT 463*  --   --  225  --   --  201  APTT 36  --   --   --   --  49* 125*  LABPROT 18.5*  --   --   --   --   --   --   INR 1.6*  --   --   --   --   --   --   HEPARINUNFRC  --   --   --   --   --  1.06* 1.04*  CREATININE 0.84  --   --  0.97  --   --  0.71  TROPONINIHS 18* 131* 217*  --  179*  --   --     Assessment: 55yo female supratherapeutic on heparin after rate increase; no gtt issues or overt signs of bleeding per RN though Hgb now down to 6.9, MD aware.  Goal of Therapy:  aPTT 66-102 seconds   Plan:  Will hold heparin gtt x65min then decrease heparin gtt by 3 units/kg/hr to 800 units/hr and check PTT and CBC in 6 hours.    Vernard Gambles, PharmD, BCPS  07/23/2020,4:23 AM

## 2020-07-23 NOTE — Progress Notes (Signed)
NAME:  Cidney Kirkwood, MRN:  [id number], DOB:  January 22, 1966, LOS: 2 ADMISSION DATE:  07/21/2020, CONSULTATION DATE:  07/21/2020 REFERRING MD:  Long - EM , CHIEF COMPLAINT:  Hypoxia, AMS  History of Present Illness:  55 yo F PMH COPD, chronic hypoxic respiratory failure on long-tern trach/vent, DVT on eliquis, hx UGIB, gout seizure disorder presents to ED 4/2 from home for AMS and hypoxia, possible respiratory failure. Found unresponsive and hypoxia by home health nurse, was not sure if pt had pulse-- chest compressions started. On EMS arrival pt had pulse. Was difficult to bag, lots of secretions. In ED, suctioned aggressively and bagged with improvement in SpO2. Some blood tinged secretions. Back on vent and mentation has improved. Has become more hypotensive and tachycardic in ED (initial SBP 140s now 80s, HR 60s to 130s) WBC 23. Started on abx with concern for PNA/sepsis.   Recurrent hospital presentations with home ventilation complications (3/25 ED for SOB and chest pain, Admit 3/15- 3/21 after mucus plug, 2/13-3/15 for resp distress on home vent after 2/[facility] from [facility]). It has been recommended that pt dc to vent SNF during previous admissions but patient and husband have declined all destinations except home with [facility].   Pertinent  Medical History  COPD Chronic hypoxic respiratory failure VDRF  Seizure disorder DVT on eliquis Upper GIB Gout  Significant Hospital Events: Including procedures, antibiotic start and stop dates in addition to other pertinent events   . 07/21/20 ED presentation from home-- hypoxia, possible cardiac arrest but less likely. Thick secretions, new leukocytosis, hypotensive. Admit to ICU . 07/22/20 narrowing abx to just cefepime. Adding reglan for ileus. CCM started GOC conversations and will consult palliative care   Interim History / Subjective:  No pain this morning. Tired. Does not want pressor support but would like to remain full code at this time.   Passing gas but no BM.   Objective   Blood pressure 135/88, pulse 61, temperature 97.7 F (36.5 C), temperature source Oral, resp. rate (!) 22, height 5\' 5"  (1.651 m), weight 47.9 kg, SpO2 100 %.    Vent Mode: PRVC FiO2 (%):  [40 %-100 %] 45 % Set Rate:  [22 bmp] 22 bmp Vt Set:  [150 mL-450 mL] 150 mL PEEP:  [5 cmH20] 5 cmH20 Plateau Pressure:  [21 cmH20-24 cmH20] 22 cmH20   Intake/Output Summary (Last 24 hours) at 07/23/2020 0700 Last data filed at 07/23/2020 0600 Gross per 24 hour  Intake 1823.17 ml  Output 635 ml  Net 1188.17 ml   Filed Weights   07/21/20 1315 07/22/20 0500 07/23/20 0409  Weight: 62 kg 53.7 kg 47.9 kg    Examination: General: chronically ill, frail adult F appears older than stated age, supine in bed trach/vent  HENT: NCAT poor dentition, trach secure anicteric sclera  Lungs: Diminished bibasilar sounds. Symmetrical chest expansion, few scattered rhonchi  Cardiovascular: rrr s1s2 cap refill brisk 2+ peripheral pulses  Abdomen: Distended soft non-tender. Hypoactive sounds x4 Extremities: Symmetrically decreased muscle bulk. No acute joint deformity, no cyanosis  Neuro: Awake, alert oriented x3 following commands.   Labs/imaging that I havepersonally reviewed  (right click and "Reselect all SmartList Selections" daily)   CXR 4/2 >bilateral opacities similar to prior KUB 4/2> likely ileus   K 4.1 WBC down to 10.6  Resolved Hospital Problem list   Hypotension   Assessment & Plan:   Possible OOH cardiac arrest vs possible respiratory arrest -unclear if pulses were lost. Did receive compressions. If she did  have an arrest at this point would suspect it is primarily hypoxemia mediated due to clinical scenario, mucus plugging. P -supportive care, pt mentation at baseline   Acute on chronic respiratory failure with hypoxia  VDRF  Aspiration PNA  -acutely hypoxic 4/2, having increased secretions. Was aggressively suctioned in ED  COPD P -trach care   -cont MV support -cont cefepime  -dc vanc (mrsa neg) -pulmicort, brovana   -holding home yupelri (4/3)   Ileus  Passing gas, no BM -NPO -cont. reglan  -NGT to suction   Hypokalemia resolved -follow labs and replace as needed   Acute on Chronic Anemia Some bloody appearing output from NGT.  - hold heparin today - transfuse 1U  - afternoon CBC post transfusion  - switch protonix to bid   DVT  - holding heparin today   Hypothyroidism -synthroid IV  Seizure disorder -clonazepam TID (changing to ODT)  Depression Sleep Disorder -have to hold home restoril and effexor with ileus (restart when able) -can add qHS support for sleep if needed   Goals of Care 4/3 -long tearful conversation with Annice Pih about the difficulties with such a challenging and advanced chronically ill state -talked about the safety benefits of considering a vent SNF -- it is clear that a facility of any sort is not acceptable (emotionally traumatic stay at [facility]). Talked about concerns with care at home (risk of respiratory arrest, cardiac arrest, mucus plugging, GI/nutrition concerns, deconditioning, skin breakdown etc) -we talked about the the considerations between quantity of time vs quality of life, and the difficulty with navigating such trying chronic clinical conditions. She is admittedly miserable living in this state, and does not feel like this is "her." Annice Pih has not had these discussions with her husband (it seems like there is a little fear of seeming like she would be giving up if quality were prioritized over quality and she does not know how to approach these talks with him) -discussed the purpose and utility of Palliative Medicine -- she is open to exploring this, palliative consulted, helping to guide difficult family discussions   Best practice (right click and "Reselect all SmartList Selections" daily)  Diet:  NPO Pain/Anxiety/Delirium protocol (if indicated): PRN fentanyl VAP  protocol (if indicated): Yes DVT prophylaxis: Systemic AC GI prophylaxis: PPI Glucose control:  SSI No  Central venous access:  N/A Arterial line:  N/A Foley:  N/A Mobility:  bed rest  PT consulted: N/A Last date of multidisciplinary goals of care discussion [ CCM NP Delorise Shiner spoke with pt 4/3] Code Status:  full code Disposition: ICU  Labs   CBC: Recent Labs  Lab 07/21/20 1207 07/22/20 0047 07/23/20 0253  WBC 23.4* 13.1* 10.6*  NEUTROABS 9.4*  --   --   HGB 10.6* 8.2* 6.9*  HCT 37.0 27.0* 22.3*  MCV 93.9 90.6 88.5  PLT 463* 225 201    Basic Metabolic Panel: Recent Labs  Lab 07/21/20 1207 07/22/20 0047 07/23/20 0253  NA 141 144 140  K 4.1 2.7* 4.1  CL 103 105 103  CO2 33* 27 32  GLUCOSE 201* 217* 82  BUN 12 16 16   CREATININE 0.84 0.97 0.71  CALCIUM 9.4 8.9 9.1  MG  --   --  2.0   GFR: Estimated Creatinine Clearance: 58.7 mL/min (by C-G formula based on SCr of 0.71 mg/dL). Recent Labs  Lab 07/21/20 1206 07/21/20 1207 07/21/20 1427 07/22/20 0047 07/22/20 1148 07/22/20 1415 07/23/20 0253  PROCALCITON  --   --  <0.10  --   --   --   --  WBC  --  23.4*  --  13.1*  --   --  10.6*  LATICACIDVEN 2.3*  --   --  4.2* 1.2 1.1  --     Liver Function Tests: Recent Labs  Lab 07/21/20 1207  AST 40  ALT 16  ALKPHOS 77  BILITOT 0.8  PROT 7.3  ALBUMIN 2.7*   No results for input(s): LIPASE, AMYLASE in the last 168 hours. No results for input(s): AMMONIA in the last 168 hours.  ABG    Component Value Date/Time   PHART 7.386 07/12/2020 2019   PCO2ART 55.8 (H) 07/12/2020 2019   PO2ART 90 07/12/2020 2019   HCO3 33.4 (H) 07/12/2020 2019   TCO2 35 (H) 07/12/2020 2019   O2SAT 96.0 07/12/2020 2019     Coagulation Profile: Recent Labs  Lab 07/21/20 1207  INR 1.6*    Cardiac Enzymes: No results for input(s): CKTOTAL, CKMB, CKMBINDEX, TROPONINI in the last 168 hours.  HbA1C: Hgb A1c MFr Bld  Date/Time Value Ref Range Status  06/21/2020 02:24 AM 5.3  4.8 - 5.6 % Final    Comment:    (NOTE) Pre diabetes:          5.7%-6.4%  Diabetes:              >6.4%  Glycemic control for   <7.0% adults with diabetes   02/26/2020 05:18 PM 5.4 4.8 - 5.6 % Final    Comment:    (NOTE) Pre diabetes:          5.7%-6.4%  Diabetes:              >6.4%  Glycemic control for   <7.0% adults with diabetes     CBG: Recent Labs  Lab 07/22/20 1112 07/22/20 1626 07/22/20 1932 07/22/20 2329 07/23/20 0335  GLUCAP 76 85 79 82 141*    Gurjit Loconte A, DO 07/23/2020, 7:00 AM Pager: [phone]

## 2020-07-23 NOTE — Progress Notes (Signed)
Date and time results received: 07/23/20 0308 (use smartphrase ".now" to insert current time)  Test: Hemoglobin Critical Value: 6.9  Name of Provider Notified: E-link  Orders Received? Or Actions Taken?: Orders Received - See Orders for details

## 2020-07-24 ENCOUNTER — Telehealth: Payer: Self-pay | Admitting: *Deleted

## 2020-07-24 DIAGNOSIS — J9621 Acute and chronic respiratory failure with hypoxia: Secondary | ICD-10-CM | POA: Diagnosis not present

## 2020-07-24 LAB — TYPE AND SCREEN
ABO/RH(D): O POS
Antibody Screen: NEGATIVE
Unit division: 0

## 2020-07-24 LAB — BASIC METABOLIC PANEL
Anion gap: 12 (ref 5–15)
Anion gap: 8 (ref 5–15)
BUN: 16 mg/dL (ref 6–20)
BUN: 15 mg/dL (ref 6–20)
CO2: 27 mmol/L (ref 22–32)
CO2: 26 mmol/L (ref 22–32)
Calcium: 8.9 mg/dL (ref 8.9–10.3)
Calcium: 9.2 mg/dL (ref 8.9–10.3)
Chloride: 105 mmol/L (ref 98–111)
Chloride: 104 mmol/L (ref 98–111)
Creatinine, Ser: 0.97 mg/dL (ref 0.44–1.00)
Creatinine, Ser: 0.69 mg/dL (ref 0.44–1.00)
GFR, Estimated: >60 mL/min (ref >60)
GFR, Estimated: >60 mL/min (ref >60)
Glucose, Bld: 217 mg/dL — ABNORMAL HIGH (ref 70–99)
Glucose, Bld: 92 mg/dL (ref 70–99)
Potassium: 2.7 mmol/L — CL (ref 3.5–5.1)
Potassium: 3.3 mmol/L — ABNORMAL LOW (ref 3.5–5.1)
Sodium: 144 mmol/L (ref 135–145)
Sodium: 138 mmol/L (ref 135–145)

## 2020-07-24 LAB — CULTURE, RESPIRATORY W GRAM STAIN

## 2020-07-24 LAB — CBC
HCT: 25.9 % — ABNORMAL LOW (ref 36.0–46.0)
Hemoglobin: 8.3 g/dL — ABNORMAL LOW (ref 12.0–15.0)
MCH: 27.5 pg (ref 26.0–34.0)
MCHC: 32 g/dL (ref 30.0–36.0)
MCV: 85.8 fL (ref 80.0–100.0)
Platelets: 208 10*3/uL (ref 150–400)
RBC: 3.02 MIL/uL — ABNORMAL LOW (ref 3.87–5.11)
RDW: 15.8 % — ABNORMAL HIGH (ref 11.5–15.5)
WBC: 9 10*3/uL (ref 4.0–10.5)
nRBC: 0 % (ref 0.0–0.2)

## 2020-07-24 LAB — BPAM RBC
Blood Product Expiration Date: 202204072359
ISSUE DATE / TIME: 202204041035
Unit Type and Rh: 5100

## 2020-07-24 LAB — GLUCOSE, CAPILLARY
Glucose-Capillary: 86 mg/dL (ref 70–99)
Glucose-Capillary: 89 mg/dL (ref 70–99)
Glucose-Capillary: 110 mg/dL — ABNORMAL HIGH (ref 70–99)
Glucose-Capillary: 103 mg/dL — ABNORMAL HIGH (ref 70–99)
Glucose-Capillary: 108 mg/dL — ABNORMAL HIGH (ref 70–99)

## 2020-07-24 LAB — MAGNESIUM: Magnesium: 1.8 mg/dL (ref 1.7–2.4)

## 2020-07-24 LAB — HEPARIN LEVEL (UNFRACTIONATED): Heparin Unfractionated: 0.29 IU/mL — ABNORMAL LOW (ref 0.30–0.70)

## 2020-07-24 LAB — APTT: aPTT: 33 seconds (ref 24–36)

## 2020-07-24 LAB — PHOSPHORUS: Phosphorus: 2.6 mg/dL (ref 2.5–4.6)

## 2020-07-24 MED ORDER — MAGNESIUM SULFATE 2 GM/50ML IV SOLN
2.0000 g | Freq: Once | INTRAVENOUS | Status: AC
  Administered 2020-07-24: 2 g via INTRAVENOUS
  Filled 2020-07-24: qty 50

## 2020-07-24 MED ORDER — ADULT MULTIVITAMIN W/MINERALS CH
1.0000 | ORAL_TABLET | Freq: Every day | ORAL | Status: DC
  Administered 2020-07-24 – 2020-07-26 (×3): 1
  Filled 2020-07-24 (×3): qty 1

## 2020-07-24 MED ORDER — SODIUM CHLORIDE 0.9 % IV SOLN
1.0000 g | INTRAVENOUS | Status: AC
  Administered 2020-07-24 – 2020-07-25 (×2): 1 g via INTRAVENOUS
  Filled 2020-07-24 (×2): qty 10

## 2020-07-24 MED ORDER — POTASSIUM CHLORIDE 20 MEQ PO PACK
40.0000 meq | PACK | ORAL | Status: AC
  Administered 2020-07-24 (×2): 40 meq
  Filled 2020-07-24 (×2): qty 2

## 2020-07-24 MED ORDER — OXYCODONE-ACETAMINOPHEN 5-325 MG PO TABS
1.0000 | ORAL_TABLET | Freq: Three times a day (TID) | ORAL | Status: DC | PRN
  Administered 2020-07-24 – 2020-07-26 (×5): 1
  Filled 2020-07-24 (×5): qty 1

## 2020-07-24 MED ORDER — VITAL HIGH PROTEIN PO LIQD: 1000.0000 mL | ORAL | Status: DC

## 2020-07-24 MED ORDER — CLONAZEPAM 0.5 MG PO TBDP
1.0000 mg | ORAL_TABLET | Freq: Three times a day (TID) | ORAL | Status: DC
  Administered 2020-07-24 – 2020-07-26 (×6): 1 mg
  Filled 2020-07-24 (×6): qty 2

## 2020-07-24 MED ORDER — APIXABAN 5 MG PO TABS
5.0000 mg | ORAL_TABLET | Freq: Two times a day (BID) | ORAL | Status: DC
  Administered 2020-07-24 – 2020-07-26 (×5): 5 mg
  Filled 2020-07-24 (×5): qty 1

## 2020-07-24 MED ORDER — VENLAFAXINE HCL 37.5 MG PO TABS
37.5000 mg | ORAL_TABLET | Freq: Two times a day (BID) | ORAL | Status: DC
  Administered 2020-07-24 – 2020-07-26 (×5): 37.5 mg
  Filled 2020-07-24 (×6): qty 1

## 2020-07-24 MED ORDER — OSMOLITE 1.5 CAL PO LIQD
480.0000 mL | ORAL | Status: DC
  Administered 2020-07-24: 480 mL
  Filled 2020-07-24 (×2): qty 711

## 2020-07-24 MED ORDER — SORBITOL 70 % SOLN
30.0000 mL | Freq: Two times a day (BID) | Status: AC
  Administered 2020-07-24 – 2020-07-25 (×3): 30 mL
  Filled 2020-07-24 (×4): qty 30

## 2020-07-24 MED ORDER — POTASSIUM CHLORIDE 10 MEQ/50ML IV SOLN: 10.0000 meq | INTRAVENOUS | Status: DC

## 2020-07-24 MED ORDER — PROSOURCE TF PO LIQD
45.0000 mL | Freq: Two times a day (BID) | ORAL | Status: DC
  Administered 2020-07-24 – 2020-07-26 (×4): 45 mL
  Filled 2020-07-24 (×4): qty 45

## 2020-07-24 NOTE — Progress Notes (Addendum)
Pharmacy Electrolyte Replacement  Recent Labs:  Recent Labs    07/23/20 0855 07/24/20 0127  K 4.1  --   MG  --  1.8  PHOS  --  2.6  CREATININE 0.71  --     Low Critical Values (K </= 2.5, Phos </= 1, Mg </= 1) Present: None  MD Contacted: Dr Katrinka Blazing  Plan: Magnesium <2 - Will supplement Magnesium 2g IV x1 today per protocol. KCl 40 per tube q4h x2 doses and KCL 10 mEq IV x2.  Link Snuffer, PharmD, BCPS, BCCCP Clinical Pharmacist Please refer to [facility] for [facility] numbers 07/24/2020, 8:21 AM

## 2020-07-24 NOTE — TOC Initial Note (Signed)
Transition of Care [facility]) - Initial/Assessment Note    Patient Details  Name: Kathleen Cameron MRN: [id number] Date of Birth: May 13, 1965  [facility]) CM/SW Contact:    Epifanio Lesches, RN Phone Number: 07/24/2020, 3:58 PM  Clinical Narrative:                 Pt with multi admits, reamitted on 4/2 with AMS, hypoxia/ ? Respiratory failure. Hx of COPD, chronic hypoxic respiratory failure on long-term trach/vent, DVT on eliquis, hx UGIB, gout seizure disorder. From home with husband. Husband is pt's primary caregiver. Husband states he is on leave from work. Pt active with [facility]/ Rob [phone]) and [facility], Peggye Fothergill [phone]) both notified of hospital stay.  TOC team following and will assist with needs ....   Expected Discharge Plan: Home w Home Health Services Barriers to Discharge: Continued Medical Work up   Patient Goals and CMS Choice        Expected Discharge Plan and Services Expected Discharge Plan: Home w Home Health Services   Discharge Planning Services: CM Consult   Living arrangements for the past 2 months: Apartment                           HH Arranged: RN HH Agency: [facility] Date [facility] Agency Contacted: 07/24/20 Time HH Agency Contacted: 1552 Representative spoke with at [facility] Agency: Rob ( voice message left)  Prior Living Arrangements/Services Living arrangements for the past 2 months: Apartment Lives with:: Spouse              Current home services: DME (home vent, oxygen)    Activities of Daily Living      Permission Sought/Granted                  Emotional Assessment              Admission diagnosis:  Healthcare-associated pneumonia [J18.9] Acute on chronic respiratory failure with hypoxia (HCC) [J96.21] Patient Active Problem List   Diagnosis Date Noted  . Aspiration pneumonia (HCC)   . Goals of care, counseling/discussion   .  Ileus (HCC)   . Acute on chronic respiratory failure with hypoxia (HCC) 07/21/2020  . Acute on chronic respiratory failure with hypercapnia (HCC) 07/03/2020  . Tracheostomy status (HCC)   . Sepsis (HCC) 06/03/2020  . Healthcare-associated pneumonia 06/03/2020  . Pressure injury of skin 06/03/2020  . Acute respiratory failure (HCC)   . Seizure disorder (HCC)   . COPD, severe (HCC)   . Anxiety disorder   . Generalized abdominal pain   . Convulsions (HCC)   . Malnutrition of moderate degree 02/27/2020  . Hypotension   . Acute hypercapnic respiratory failure (HCC) 02/25/2020  . Right upper quadrant abdominal pain 02/01/2020  . Hypothyroidism (acquired) 02/01/2020  . Acute metabolic encephalopathy 01/29/2020  . Hyponatremia 01/28/2020  . Anxiety 12/02/2019  . COPD exacerbation (HCC) 11/06/2019  . Depression 11/06/2019  . Acute on chronic respiratory failure with hypoxia and hypercapnia (HCC) 11/06/2019  . Current moderate episode of major depressive disorder without prior episode (HCC) 09/20/2019  . Pedal edema 08/04/2019  . Hypokalemia 04/13/2019  . Chronic respiratory failure with hypoxia (HCC) 03/07/2019  . CKD (chronic kidney disease) 01/05/2019  . COPD (chronic obstructive pulmonary disease) (HCC) 09/29/2018  . Pulmonary nodules 09/27/2018  . Dyslipidemia 07/07/2018  . Essential hypertension 03/10/2018   PCP:  Edwina Barth  Elita Quick, MD Pharmacy:   [facility] [phone] - [place], [place] - [address] AT [facility] OF [address] & [address] [place] [address] Phone: [phone] Fax: [phone]  [facility] - [place], [place] - [address] [place] [address] Phone: [phone] Fax: [phone]     Social Determinants of Health (SDOH) Interventions    Readmission Risk Interventions Readmission Risk Prevention Plan 02/02/2020  Transportation Screening Complete  PCP or  Specialist Appt within 3-5 Days Complete  HRI or Home Care Consult Complete  Social Work Consult for Recovery Care Planning/Counseling Complete  Palliative Care Screening Not Applicable  Medication Review Oceanographer) Complete  Some recent data might be hidden

## 2020-07-24 NOTE — Progress Notes (Signed)
Initial Nutrition Assessment  DOCUMENTATION CODES:   Non-severe (moderate) malnutrition in context of chronic illness  INTERVENTION:   Start trickle feedings via PEG:  Osmolite 1.5 at 20 ml/h.   Prosource TF 45 ml BID.  Provides 800 kcal, 52 gm protein, 366 ml free water daily.  When ileus resolves, recommend increase Osmolite 1.5 to goal rate of 50 ml/h and continue Prosource TF 45 ml BID to provide 1880 kcal, 97 gm protein, 914 ml free water daily.  MVI with minerals daily via tube.  When IVF discontinued, recommend adding free water flushes 250 ml QID for total free water intake of 1914 ml per day.  NUTRITION DIAGNOSIS:   Moderate Malnutrition related to chronic illness (COPD, trach on chronic vent support) as evidenced by mild muscle depletion,percent weight loss (8% weight loss within 1 month).  GOAL:   Patient will meet greater than or equal to 90% of their needs  MONITOR:   TF tolerance,Labs,Diet advancement  REASON FOR ASSESSMENT:   Ventilator,Consult Enteral/tube feeding initiation and management  ASSESSMENT:   55 yo female admitted with ileus and respiratory failure. Received CPR at home, questionable cardiac arrest. Suspected aspiration r/t ileus. 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   Discussed patient in ICU rounds and with RN today. Received MD Consult for TF initiation and management. Plans to begin trickle feedings at 20 ml/h today.  Continuing to receive Reglan for one more day.  She had a BM this morning.  On previous admissions, patient was allowed to take POs. She was eating very well last admission, consuming 75-100% of meals as well as drinking Ensure Enlive BID.   Spoke with patient and her husband at bedside. At home patient typically eats breakfast, lunch, and dinner every day. Meals are substantial: waffles, eggs, bacon for breakfast, sandwich for lunch, meal that husband  prepares for dinner (meat, vegetable, starch). On occasion, she misses lunch because she is sleeping, so she is given an Ensure supplement by the home health RN. She also receives 1 carton of Osmolite 1.5 once daily via PEG.   Patient is currently intubated on ventilator support MV: 9.3 L/min Temp (24hrs), Avg:98 F (36.7 C), Min:97.3 F (36.3 C), Max:98.4 F (36.9 C)   Labs reviewed. K 3.3 CBG: 86-89  Medications reviewed and include Reglan, Protonix, Sorbitol solution, KCl, IV Rocephin. IVF: D10 at 30 ml/h  On admission, weight recorded at 47.9 kg, currently 53.5 kg  I/O + 2.2 L Last admission on 3/17, patient weighed 58 kg. 8% weight loss within one month is severe.   Patient meets criteria for moderate malnutrition, given 8% weight loss x 1 month and mild muscle depletion.   NUTRITION - FOCUSED PHYSICAL EXAM:  Flowsheet Row Most Recent Value  Orbital Region No depletion  Upper Arm Region No depletion  Thoracic and Lumbar Region Unable to assess  Buccal Region No depletion  Temple Region Mild depletion  Clavicle Bone Region Mild depletion  Clavicle and Acromion Bone Region No depletion  Scapular Bone Region Unable to assess  Dorsal Hand Mild depletion  Patellar Region Mild depletion  Anterior Thigh Region Mild depletion  Posterior Calf Region Mild depletion  Edema (RD Assessment) Mild  Hair Reviewed  Eyes Reviewed  Mouth Reviewed  Skin Reviewed  Nails Reviewed       Diet Order:   Diet Order            Diet NPO time specified  Diet  effective now                 EDUCATION NEEDS:   No education needs have been identified at this time  Skin:  Skin Assessment: Skin Integrity Issues: Skin Integrity Issues:: Stage I,Stage II Stage I: sacrum (fully granulated) Stage II: R buttocks (fully granulated), R ankle (partial granulation)  Last BM:  4/5 type 6  Height:   Ht Readings from Last 1 Encounters:  07/21/20 5\' 5"  (1.651 m)    Weight:   Wt  Readings from Last 1 Encounters:  07/24/20 53.5 kg    Ideal Body Weight:  56.8 kg  BMI:  Body mass index is 19.63 kg/m.  Estimated Nutritional Needs:   Kcal:  1800-2000  Protein:  85-100 gm  Fluid:  >/= 1.8 L    09/23/20, RD, LDN, CNSC Please refer to Amion for contact information.

## 2020-07-24 NOTE — Progress Notes (Signed)
Vent settings changed to coincide with home vent settings.  Vt increased to 480 and rate decreased to 16.  Will continue to monitor for patient tolerance and change to home ventilator when appropriate.

## 2020-07-24 NOTE — Progress Notes (Addendum)
NAME:  Kathleen Cameron, MRN:  [id number], DOB:  1965-09-18, LOS: 3 ADMISSION DATE:  07/21/2020, CONSULTATION DATE:  07/21/2020 REFERRING MD:  Long - EM , CHIEF COMPLAINT:  Hypoxia, AMS  History of Present Illness:  55 yo F PMH COPD, chronic hypoxic respiratory failure on long-tern trach/vent, DVT on eliquis, hx UGIB, gout seizure disorder presents to ED 4/2 from home for AMS and hypoxia, possible respiratory failure. Found unresponsive and hypoxia by home health nurse, was not sure if pt had pulse-- chest compressions started. On EMS arrival pt had pulse. Was difficult to bag, lots of secretions. In ED, suctioned aggressively and bagged with improvement in SpO2. Some blood tinged secretions. Back on vent and mentation has improved. Has become more hypotensive and tachycardic in ED (initial SBP 140s now 80s, HR 60s to 130s) WBC 23. Started on abx with concern for PNA/sepsis.   Recurrent hospital presentations with home ventilation complications (3/25 ED for SOB and chest pain, Admit 3/15- 3/21 after mucus plug, 2/13-3/15 for resp distress on home vent after 2/[facility] discharge from [facility]). It has been recommended that pt dc to vent SNF during previous admissions but patient and husband have declined all destinations except home with [facility].   Pertinent  Medical History  COPD Chronic hypoxic respiratory failure VDRF  Seizure disorder DVT on eliquis Upper GIB Gout  Significant Hospital Events: Including procedures, antibiotic start and stop dates in addition to other pertinent events   . 07/21/20 ED presentation from home-- hypoxia, possible cardiac arrest but less likely. Thick secretions, new leukocytosis, hypotensive. Admit to ICU . 07/22/20 narrowing abx to just cefepime. Adding reglan for ileus. CCM started GOC conversations and will consult palliative care   Interim History / Subjective:  Chest pain improved today. BM overnight.   Objective   Blood pressure (!) 160/102, pulse (!) 57, temperature  97.8 F (36.6 C), temperature source Oral, resp. rate (!) 22, height 5\' 5"  (1.651 m), weight 53.5 kg, SpO2 100 %.    Vent Mode: PRVC FiO2 (%):  [40 %-45 %] 40 % Set Rate:  [22 bmp] 22 bmp Vt Set:  [450 mL] 450 mL PEEP:  [5 cmH20] 5 cmH20 Plateau Pressure:  [19 cmH20-23 cmH20] 20 cmH20   Intake/Output Summary (Last 24 hours) at 07/24/2020 0715 Last data filed at 07/24/2020 0700 Gross per 24 hour  Intake 1223.7 ml  Output 1850 ml  Net -626.3 ml   Filed Weights   07/22/20 0500 07/23/20 0409 07/24/20 0500  Weight: 53.7 kg 47.9 kg 53.5 kg    Examination: General: chronically ill, frail adult F appears older than stated age, supine in bed trach/vent  HENT: NCAT poor dentition, trach secure anicteric sclera  Lungs: Diminished bibasilar sounds. Symmetrical chest expansion, few scattered rhonchi  Cardiovascular: rrr s1s2 cap refill brisk 2+ peripheral pulses  Abdomen: Distended soft non-tender. Normal BS.  MSK: TTP over sternum Extremities: Symmetrically decreased muscle bulk. No acute joint deformity, no cyanosis  Neuro: Awake, alert oriented x3 following commands.   Labs/imaging that I havepersonally reviewed  (right click and "Reselect all SmartList Selections" daily)   CXR 4/2 >bilateral opacities similar to prior KUB 4/2> likely ileus   Hemoglobin 8.3 Leukocytosis resolved Bmp pending   Resolved Hospital Problem list   Hypotension  Hypokalemia   Assessment & Plan:   Possible OOH cardiac arrest vs possible respiratory arrest -unclear if pulses were lost. Did receive compressions. If she did have an arrest at this point would suspect it is primarily hypoxemia  mediated due to clinical scenario, mucus plugging. P -supportive care, pt mentation at baseline  - restart home oxycodone pain, lidocaine patch, minimize dilaudid prn severe pain  Acute on chronic respiratory failure with hypoxia  VDRF  Aspiration PNA  -acutely hypoxic 4/2, having increased secretions. Was  aggressively suctioned in ED. Now improved.  COPD P -trach care  -cont MV support - pain 2/2 CPR  -cont cefepime 5 days  -dc vanc (mrsa neg) -pulmicort, brovana   -holding home yupelri (4/3)   Ileus  BM overnight  -start trickle feeds, stop d10 when tolerating  -cont. reglan one more day    Hypokalemia resolved -follow labs and replace as needed   Acute on Chronic Anemia Some bloody appearing output from NGT yesterday. Transfused 1U for hgb 6.9. Stable today at 8.3.  - restart home eliquis  - cont. protonix bid today   DVT  - restart home eliquis   Hypothyroidism -synthroid IV, switch to po if tolerating tube feeds   Seizure disorder -clonazepam TID (changing to ODT)  Depression Sleep Disorder -restart home venlafaxine - on klonopin as above  -can add qHS support for sleep if needed   Goals of Care 4/3 -long tearful conversation with Annice Pih about the difficulties with such a challenging and advanced chronically ill state -talked about the safety benefits of considering a vent SNF -- it is clear that a facility of any sort is not acceptable (emotionally traumatic stay at [facility]). Talked about concerns with care at home (risk of respiratory arrest, cardiac arrest, mucus plugging, GI/nutrition concerns, deconditioning, skin breakdown etc) -we talked about the the considerations between quantity of time vs quality of life, and the difficulty with navigating such trying chronic clinical conditions. She is admittedly miserable living in this state, and does not feel like this is "her." Annice Pih has not had these discussions with her husband (it seems like there is a little fear of seeming like she would be giving up if quality were prioritized over quality and she does not know how to approach these talks with him) -discussed the purpose and utility of Palliative Medicine -- she is open to exploring this, palliative consulted, helping to guide difficult family discussions   Best  practice (right click and "Reselect all SmartList Selections" daily)  Diet:  NPO Pain/Anxiety/Delirium protocol (if indicated): PRN fentanyl VAP protocol (if indicated): Yes DVT prophylaxis: Systemic AC GI prophylaxis: PPI Glucose control:  SSI No  Central venous access:  N/A Arterial line:  N/A Foley:  N/A Mobility:  bed rest  PT consulted: N/A Last date of multidisciplinary goals of care discussion [ CCM NP Delorise Shiner spoke with pt 4/3] Code Status:  full code Disposition: ICU  Labs   CBC: Recent Labs  Lab 07/21/20 1207 07/22/20 0047 07/23/20 0253 07/23/20 1457 07/24/20 0127  WBC 23.4* 13.1* 10.6* 11.6* 9.0  NEUTROABS 9.4*  --   --   --   --   HGB 10.6* 8.2* 6.9* 8.6* 8.3*  HCT 37.0 27.0* 22.3* 27.2* 25.9*  MCV 93.9 90.6 88.5 86.9 85.8  PLT 463* 225 201 230 208    Basic Metabolic Panel: Recent Labs  Lab 07/21/20 1207 07/22/20 0047 07/23/20 0253 07/23/20 0855 07/24/20 0127  NA 141 144 140 139  --   K 4.1 2.7* 4.1 4.1  --   CL 103 105 103 103  --   CO2 33* 27 32 28  --   GLUCOSE 201* 217* 82 78  --   BUN 12 16  16 15  --   CREATININE 0.84 0.97 0.71 0.71  --   CALCIUM 9.4 8.9 9.1 9.3  --   MG  --   --  2.0  --  1.8  PHOS  --   --   --   --  2.6   GFR: Estimated Creatinine Clearance: 65.5 mL/min (by C-G formula based on SCr of 0.71 mg/dL). Recent Labs  Lab 07/21/20 1206 07/21/20 1207 07/21/20 1427 07/22/20 0047 07/22/20 1148 07/22/20 1415 07/23/20 0253 07/23/20 1457 07/24/20 0127  PROCALCITON  --   --  <0.10  --   --   --   --   --   --   WBC  --    < >  --  13.1*  --   --  10.6* 11.6* 9.0  LATICACIDVEN 2.3*  --   --  4.2* 1.2 1.1  --   --   --    < > = values in this interval not displayed.    Liver Function Tests: Recent Labs  Lab 07/21/20 1207  AST 40  ALT 16  ALKPHOS 77  BILITOT 0.8  PROT 7.3  ALBUMIN 2.7*   No results for input(s): LIPASE, AMYLASE in the last 168 hours. No results for input(s): AMMONIA in the last 168 hours.  ABG     Component Value Date/Time   PHART 7.386 07/12/2020 2019   PCO2ART 55.8 (H) 07/12/2020 2019   PO2ART 90 07/12/2020 2019   HCO3 33.4 (H) 07/12/2020 2019   TCO2 35 (H) 07/12/2020 2019   O2SAT 96.0 07/12/2020 2019     Coagulation Profile: Recent Labs  Lab 07/21/20 1207  INR 1.6*    Cardiac Enzymes: No results for input(s): CKTOTAL, CKMB, CKMBINDEX, TROPONINI in the last 168 hours.  HbA1C: Hgb A1c MFr Bld  Date/Time Value Ref Range Status  06/21/2020 02:24 AM 5.3 4.8 - 5.6 % Final    Comment:    (NOTE) Pre diabetes:          5.7%-6.4%  Diabetes:              >6.4%  Glycemic control for   <7.0% adults with diabetes   02/26/2020 05:18 PM 5.4 4.8 - 5.6 % Final    Comment:    (NOTE) Pre diabetes:          5.7%-6.4%  Diabetes:              >6.4%  Glycemic control for   <7.0% adults with diabetes     CBG: Recent Labs  Lab 07/23/20 1057 07/23/20 1612 07/23/20 1953 07/23/20 2333 07/24/20 0337  GLUCAP 89 92 109* 97 86    Lochlann Mastrangelo A, DO 07/24/2020, 7:15 AM Pager: [phone]

## 2020-07-24 NOTE — Telephone Encounter (Signed)
On July 23 2020, faxed signed order for Care Plan addendum to [facility]. Confirmation received.

## 2020-07-24 NOTE — Progress Notes (Signed)
Pt placed on Trilogy home ventilator with settings as follows:  vt 480 mls, AC rate 16, 5 l/m 02 bleed-in and PEEP 5.  Pt is comfortable; respirations even and unlabored.  Will continue to monitor for patient tolerance.

## 2020-07-25 ENCOUNTER — Other Ambulatory Visit (HOSPITAL_COMMUNITY): Payer: Self-pay

## 2020-07-25 DIAGNOSIS — J9621 Acute and chronic respiratory failure with hypoxia: Secondary | ICD-10-CM | POA: Diagnosis not present

## 2020-07-25 LAB — CBC
HCT: 28.2 % — ABNORMAL LOW (ref 36.0–46.0)
Hemoglobin: 9.1 g/dL — ABNORMAL LOW (ref 12.0–15.0)
MCH: 28.2 pg (ref 26.0–34.0)
MCHC: 32.3 g/dL (ref 30.0–36.0)
MCV: 87.3 fL (ref 80.0–100.0)
Platelets: 220 10*3/uL (ref 150–400)
RBC: 3.23 MIL/uL — ABNORMAL LOW (ref 3.87–5.11)
RDW: 15.8 % — ABNORMAL HIGH (ref 11.5–15.5)
WBC: 9.5 10*3/uL (ref 4.0–10.5)
nRBC: 0 % (ref 0.0–0.2)

## 2020-07-25 LAB — GLUCOSE, CAPILLARY
Glucose-Capillary: 102 mg/dL — ABNORMAL HIGH (ref 70–99)
Glucose-Capillary: 104 mg/dL — ABNORMAL HIGH (ref 70–99)
Glucose-Capillary: 104 mg/dL — ABNORMAL HIGH (ref 70–99)
Glucose-Capillary: 107 mg/dL — ABNORMAL HIGH (ref 70–99)
Glucose-Capillary: 114 mg/dL — ABNORMAL HIGH (ref 70–99)
Glucose-Capillary: 129 mg/dL — ABNORMAL HIGH (ref 70–99)
Glucose-Capillary: 97 mg/dL (ref 70–99)

## 2020-07-25 LAB — MAGNESIUM: Magnesium: 2 mg/dL (ref 1.7–2.4)

## 2020-07-25 LAB — PHOSPHORUS: Phosphorus: 2.7 mg/dL (ref 2.5–4.6)

## 2020-07-25 MED ORDER — PANTOPRAZOLE SODIUM 40 MG PO PACK
40.0000 mg | PACK | Freq: Every day | ORAL | Status: DC
  Administered 2020-07-25 – 2020-07-26 (×2): 40 mg
  Filled 2020-07-25 (×2): qty 20

## 2020-07-25 MED ORDER — LEVOTHYROXINE SODIUM 25 MCG PO TABS
50.0000 ug | ORAL_TABLET | Freq: Every day | ORAL | Status: DC
  Administered 2020-07-25 – 2020-07-26 (×2): 50 ug
  Filled 2020-07-25 (×2): qty 2

## 2020-07-25 MED ORDER — METOCLOPRAMIDE HCL 5 MG/5ML PO SOLN
10.0000 mg | Freq: Two times a day (BID) | ORAL | 1 refills | Status: DC
  Filled 2020-07-25: qty 473, 24d supply, fill #0

## 2020-07-25 MED ORDER — SORBITOL 70 % SOLN
30.0000 mL | Freq: Every day | 2 refills | Status: AC | PRN
  Filled 2020-07-25: qty 473, 15d supply, fill #0

## 2020-07-25 MED ORDER — METOCLOPRAMIDE HCL 10 MG PO TABS
10.0000 mg | ORAL_TABLET | Freq: Two times a day (BID) | ORAL | Status: DC
  Administered 2020-07-25 – 2020-07-26 (×2): 10 mg
  Filled 2020-07-25 (×3): qty 1

## 2020-07-25 MED ORDER — LIDOCAINE 5 % EX PTCH
1.0000 | MEDICATED_PATCH | Freq: Every day | CUTANEOUS | 0 refills | Status: DC | PRN
  Filled 2020-07-25: qty 14, 14d supply, fill #0

## 2020-07-25 MED ORDER — OSMOLITE 1.5 CAL PO LIQD
1000.0000 mL | ORAL | Status: DC
  Administered 2020-07-25 (×2): 1000 mL
  Filled 2020-07-25 (×2): qty 1000

## 2020-07-25 MED ORDER — BUDESONIDE 0.25 MG/2ML IN SUSP
0.2500 mg | Freq: Two times a day (BID) | RESPIRATORY_TRACT | Status: DC
  Administered 2020-07-25 – 2020-07-26 (×2): 0.25 mg via RESPIRATORY_TRACT
  Filled 2020-07-25 (×2): qty 2

## 2020-07-25 MED ORDER — ARFORMOTEROL TARTRATE 15 MCG/2ML IN NEBU
15.0000 ug | INHALATION_SOLUTION | Freq: Two times a day (BID) | RESPIRATORY_TRACT | Status: DC
  Administered 2020-07-25 – 2020-07-26 (×2): 15 ug via RESPIRATORY_TRACT
  Filled 2020-07-25 (×3): qty 2

## 2020-07-25 NOTE — Progress Notes (Signed)
NAME:  Kathleen Cameron, MRN:  [id number], DOB:  01/31/1966, LOS: 4 ADMISSION DATE:  07/21/2020, CONSULTATION DATE:  07/21/2020 REFERRING MD:  Long - EM , CHIEF COMPLAINT:  Hypoxia, AMS  History of Present Illness:  55 yo F PMH COPD, chronic hypoxic respiratory failure on long-tern trach/vent, DVT on eliquis, hx UGIB, gout seizure disorder presents to ED 4/2 from home for AMS and hypoxia, possible respiratory failure. Found unresponsive and hypoxia by home health nurse, was not sure if pt had pulse-- chest compressions started. On EMS arrival pt had pulse. Was difficult to bag, lots of secretions. In ED, suctioned aggressively and bagged with improvement in SpO2. Some blood tinged secretions. Back on vent and mentation has improved. Has become more hypotensive and tachycardic in ED (initial SBP 140s now 80s, HR 60s to 130s) WBC 23. Started on abx with concern for PNA/sepsis.   Recurrent hospital presentations with home ventilation complications (3/25 ED for SOB and chest pain, Admit 3/15- 3/21 after mucus plug, 2/13-3/15 for resp distress on home vent after [facility]). It has been recommended that pt dc to vent SNF during previous admissions but patient and husband have declined all destinations except home with [facility].   Pertinent  Medical History  COPD Chronic hypoxic respiratory failure VDRF  Seizure disorder DVT on eliquis Upper GIB Gout  Significant Hospital Events: Including procedures, antibiotic start and stop dates in addition to other pertinent events   . 07/21/20 ED presentation from home-- hypoxia, possible cardiac arrest but less likely. Thick secretions, new leukocytosis, hypotensive. Admit to ICU . 07/22/20 narrowing abx to just cefepime. Adding reglan for ileus. CCM started GOC conversations and will consult palliative care   Interim History / Subjective:  No events. Has BM. On 20/hr TF On home vent  Objective   Blood pressure 104/90, pulse 78, temperature 97.6  F (36.4 C), temperature source Oral, resp. rate 16, height 5\' 5"  (1.651 m), weight 51.4 kg, SpO2 100 %.    Vent Mode: AC FiO2 (%):  [40 %] 40 % Set Rate:  [16 bmp-22 bmp] 16 bmp Vt Set:  [450 mL-480 mL] 480 mL PEEP:  [5 cmH20] 5 cmH20 Plateau Pressure:  [16 cmH20-19 cmH20] 16 cmH20   Intake/Output Summary (Last 24 hours) at 07/25/2020 0919 Last data filed at 07/25/2020 0700 Gross per 24 hour  Intake 1753.8 ml  Output 1100 ml  Net 653.8 ml   Filed Weights   07/23/20 0409 07/24/20 0500 07/25/20 0500  Weight: 47.9 kg 53.5 kg 51.4 kg    Examination: Constitutional: frail woman in NAD  Eyes: EOMI, pupils equal Ears, nose, mouth, and throat: trach in place, minimal secretions Cardiovascular: RRR, ext warm Respiratory: diminished breath sounds, passive on vent Gastrointestinal: more protuberant today but nontender, +BS Skin: No rashes, normal turgor Neurologic: moves all 4 ext to command Psychiatric: RASS 0  CBC and BMP look good, stop lab draws   Labs/imaging that I havepersonally reviewed  (right click and "Reselect all SmartList Selections" daily)   CXR 4/2 >bilateral opacities similar to prior KUB 4/2> likely ileus   Hemoglobin 8.3 Leukocytosis resolved Bmp pending   Resolved Hospital Problem list   Hypotension  Hypokalemia   Assessment & Plan:  -Question OOH cardiac arrest from aspiration- no obvious neurological sequelae  -Aspiration due to ileus  -Ileusimproving  -Acute on chronic hypoxemic respiratory failure due to aspiration  -Encounter for palliative care  -Anemia, hx of DVT on eliquis PTA  -Hypoglycemia resolved   -  Increase TF to goal, remove NGT - Watch stomach - Start PO reglan - Continue home vent - If stable can probably discharge home tomorrow   Kathleen Halsted MD PCCM

## 2020-07-25 NOTE — TOC Progression Note (Signed)
Transition of Care [facility]) - Progression Note    Patient Details  Name: Kathleen Cameron MRN: [id number] Date of Birth: 27-Oct-1965  [facility] CM/SW Contact  Epifanio Lesches, RN Phone Number: 07/25/2020, 11:46 AM  Clinical Narrative:    Discussion had with pt and husband @ bedside by MD, NCM and SW regarding safety issue discharging to home vs SNF, pt with multi readmission from home. Pt continues to decline SNF placement... husband supports pt's decision. Per MD pt will be d/c ready on 07/26/2020. NCM alerted [facility] HH/ Rob [phone]) and[facility], [place] [place]'s([phone]).  MD please send Rx meds to [facility] pharmacy for filling to ensure medication in hand with d/c.  Resumption order for private duty nursing placed. Pt will need PTAR transportation to home with husband accompanying.  TOC team monitoring and will  continue to assist with TOC needs.   Expected Discharge Plan: Home w Home Health Services Barriers to Discharge: Continued Medical Work up  Expected Discharge Plan and Services Expected Discharge Plan: Home w Home Health Services   Discharge Planning Services: CM Consult   Living arrangements for the past 2 months: Apartment                           HH Arranged: RN HH Agency: [facility] Date [facility] Agency Contacted: 07/24/20 Time HH Agency Contacted: [phone] Representative spoke with at [facility] Agency: Rob ( voice message left)   Social Determinants of Health (SDOH) Interventions    Readmission Risk Interventions Readmission Risk Prevention Plan 02/02/2020  Transportation Screening Complete  PCP or Specialist Appt within 3-5 Days Complete  HRI or Home Care Consult Complete  Social Work Consult for Recovery Care Planning/Counseling Complete  Palliative Care Screening Not Applicable  Medication Review [employer]) Complete  Some recent data might be hidden

## 2020-07-25 NOTE — Discharge Summary (Signed)
Physician Discharge Summary       Patient ID: Kathleen Cameron MRN: [id number] DOB/AGE: 12-12-1965 55 y.o.  Admit date: 07/21/2020 Discharge date: 07/25/2020  Discharge Diagnoses:  Active Problems:   Hypokalemia   Acute on chronic respiratory failure with hypoxia (HCC)   Aspiration pneumonia (HCC)   Goals of care, counseling/discussion   Ileus (HCC)   History of Present Illness:   55 yo F PMH COPD, chronic hypoxic respiratory failure on long-tern trach/vent, DVT on eliquis, hx UGIB, gout, and seizure disorder. She has had recurrent hospital presentations with home ventilation complications (0/17 ED for SOB and chest pain, Admit 3/15- 3/21 after mucus plug, 2/13-3/15 for resp distress on home vent after 2/13 [facility] from [facility]). It has been recommended that pt dc to vent SNF during previous admissions but patient and husband have declined all destinations except home with [facility].    She again presented to ED 4/2 from home for AMS and hypoxia, possible respiratory failure. She has been admitted and discharged to home twice this year so far with home vent and has been unable to remain home for more than a day each time. She was found unresponsive and hypoxic by home health nurse, was not sure if pt had pulse-- chest compressions started. On EMS arrival pt had pulse. Was difficult to bag, lots of secretions. In ED, suctioned aggressively and bagged with improvement in SpO2. Some blood tinged secretions. Back on vent and mentation has improved. Has become more hypotensive and tachycardic in ED (initial SBP 140s now 80s, HR 60s to 130s) WBC 23. Started on abx with concern for PNA/sepsis. She was admitted to the ICU on the critical care medicine service.    Hospital Course:   After improving with BVM and being placed back on the vent her stay has been rather uneventful. She was treated with empiric antibiotics, which were quickly narrowed and discontinued. Course complicated by ileus, which has  resolved with conservative management. She had acute on chronic anemia with some bloody secretions from trach and NG tube after chest compression. She is on eliquis for history of DVT. Transfused 1U while admitted with resolution of bleeding and stabilization of hemoglobin.  Goals of care discussion was held during admission as she has returned to hospital shortly after discharge home three times this year, has been feeling a lot of stress and depression due to the severity of her illness and there are concerns about safety and ability of husband to care for her alone at home. Palliative care was consulted. Social work was also consulted to discuss that she may be safer with SNF as she is having frequent events at home that are leading to readmission and increased mortality risk. Group discussion held, but the family ultimately decided to take her home.  On 4/7 she was deemed a candidate for discharge.     Discharge Plan by active problems   Acute on Chronic Hypoxemic Respiratory Failure Continued on home vent settings. Follow-up with pulmonology outpatient.   Ileus Resolved. Continue reglan bid and sorbitol prn.   Acute on Chronic Anemia Some bloody secretions from trach and NG tube after chest compression. She is on eliquis for history of DVT. Transfused 1U while admitted with resolution of bleeding and stabilization of hemoglobin. Continue Eliquis at discharge.    Significant Hospital tests/ studies  Consults - palliative care  CXR 4/2 >bilateral opacities similar to prior KUB 4/2> likely ileus   Discharge Exam: BP (!) 174/102   Pulse 87  Temp 98.6 F (37 C) (Oral)   Resp 19   Ht _0  (1.651 m)   Wt 51.4 kg   SpO2 100%   BMI 18.86 kg/m   Constitution: NAD, supine in bed  HENT: trach in place, minimal excretions  Eyes: eomi intact, no icterus or injection  Cardio: RRR, extremities warm, no LE edema  Respiratory: diminished breath sounds, on home vent settings  Abdominal:  mildly TTP, soft, non-distended  MSK: moving all extremities Neuro: alert & oriented Skin: pressure ulcer bilateral LE with dressings inplace, no strike through    Labs at discharge Lab Results  Component Value Date   CREATININE 0.69 07/24/2020   BUN 15 07/24/2020   NA 138 07/24/2020   K 3.3 (L) 07/24/2020   CL 104 07/24/2020   CO2 26 07/24/2020   Lab Results  Component Value Date   WBC 9.5 07/25/2020   HGB 9.1 (L) 07/25/2020   HCT 28.2 (L) 07/25/2020   MCV 87.3 07/25/2020   PLT 220 07/25/2020   Lab Results  Component Value Date   ALT 16 07/21/2020   AST 40 07/21/2020   ALKPHOS 77 07/21/2020   BILITOT 0.8 07/21/2020   Lab Results  Component Value Date   INR 1.6 (H) 07/21/2020   INR 1.2 03/23/2020   INR 0.98 09/18/2013    Current radiology studies No results found.  Disposition:   There are no questions and answers to display.         Allergies as of 07/25/2020      Reactions   Stiolto Respimat [tiotropium Bromide-olodaterol] Other (See Comments)   Severe headaches and rapid heartrate      Medication List    TAKE these medications   apixaban 5 MG Tabs tablet Commonly known as: ELIQUIS Place 1 tablet (5 mg total) into feeding tube 2 (two) times daily. What changed: Another medication with the same name was removed. Continue taking this medication, and follow the directions you see here.   arformoterol 15 MCG/2ML Nebu Commonly known as: BROVANA Take 2 mLs (15 mcg total) by nebulization 2 (two) times daily.   bisacodyl 10 MG suppository Commonly known as: DULCOLAX Place 1 suppository (10 mg total) rectally daily as needed for severe constipation.   budesonide 0.25 MG/2ML nebulizer solution Commonly known as: PULMICORT Take 2 mLs (0.25 mg total) by nebulization 2 (two) times daily.   chlorhexidine gluconate (MEDLINE KIT) 0.12 % solution Commonly known as: PERIDEX 15 mLs by Mouth Rinse route 2 (two) times daily.   Chlorhexidine Gluconate Cloth  2 % Pads Apply 6 each topically daily.   clonazePAM 1 MG tablet Commonly known as: KLONOPIN Place 1 tablet (1 mg total) into feeding tube 3 (three) times daily.   Ensure Active High Protein Liqd Take 1 Can by mouth in the morning and at bedtime.   feeding supplement (OSMOLITE 1.2 CAL) Liqd Place 1,000 mLs into feeding tube continuous.   levothyroxine 50 MCG tablet Commonly known as: SYNTHROID Take 1 tablet (50 mcg total) by mouth daily at 6 (six) AM.   lip balm ointment Apply topically as needed for lip care.   metoCLOPramide 5 MG/5ML solution Commonly known as: REGLAN Place 10 mLs (10 mg total) into feeding tube in the morning and at bedtime.   multivitamin with minerals Tabs tablet Place 1 tablet into feeding tube daily.   oxyCODONE-acetaminophen 5-325 MG tablet Commonly known as: PERCOCET/ROXICET PLACE 1 TABLET INTO FEEDING TUBE EVERY EIGHT HOURS AS NEEDED FOR UP TO 3  DAYS FOR SEVERE PAIN.   pantoprazole sodium 40 mg/20 mL Pack Commonly known as: PROTONIX Place 20 mLs (40 mg total) into feeding tube at bedtime.   phenol 1.4 % Liqd Commonly known as: CHLORASEPTIC Use as directed 1 spray in the mouth or throat as needed for throat irritation / pain.   polyethylene glycol 17 g packet Commonly known as: MIRALAX / GLYCOLAX Place 17 g into feeding tube daily as needed for mild constipation.   revefenacin 175 MCG/3ML nebulizer solution Commonly known as: YUPELRI Take 3 mLs (175 mcg total) by nebulization daily.   rosuvastatin 40 MG tablet Commonly known as: CRESTOR Place 1 tablet (40 mg total) into feeding tube daily.   senna-docusate 8.6-50 MG tablet Commonly known as: Senokot-S Place 2 tablets into feeding tube at bedtime as needed for moderate constipation.   sodium chloride 0.65 % Soln nasal spray Commonly known as: OCEAN Place 1 spray into both nostrils as needed for congestion.   sorbitol 70 % Soln Place 30 mLs into feeding tube daily as needed for  moderate constipation.   temazepam 15 MG capsule Commonly known as: RESTORIL Place 1 capsule (15 mg total) into feeding tube at bedtime as needed for sleep.   venlafaxine 37.5 MG tablet Commonly known as: EFFEXOR Place 1 tablet (37.5 mg total) into feeding tube 2 (two) times daily with a meal.        Discharged Condition: stable  Amaurie Wandel A, DO 07/26/2020, 9:05 AM Pager: [phone]

## 2020-07-25 NOTE — TOC Progression Note (Signed)
Transition of Care [facility]) - Progression Note    Patient Details  Name: Kathleen Cameron MRN: [id number] Date of Birth: [birth year]  Transition of Care [facility]) CM/SW [place],  Phone Number: 07/25/2020, 4:40 PM  Clinical Narrative:     CSW was informed by RN staff about concerns about pt's husband's ability to care for her at home specifically about trach care, obtaining medications from pharmacy within timely manner, and not enough supervision being provided at home.   CSW met with pt and pt husband bedside along with RNCM and MD. MD explained risks associated with going home with [facility] vs SNF. Pt maintains she wants to return home with [facility] and husband agrees as well. MD steps out.   Husband provides some background. He states he has been using FMLA from work which has required doctors notes. He explains that as of last week he is on "leave" from work. He states when private duty or [facility] staff are not at the home with pt then he is there. He explains that he had trouble getting medications after last admission because the pharmacy had to receive prior authorization from [employer] which delayed his ability to obtain medications. Pt husband states he feels comfortable with provide pt's trach care and is satisfied with current PCS/nursing staff's. Pt shook her head side to side signalling know when asked if she has any concerns about her care at home. CSW explained he would be available to assist if pt were to want SNF placement. TOC will continue to follow.   Expected Discharge Plan: [place] Barriers to Discharge: Continued Medical Work up  Expected Discharge Plan and Services Expected Discharge Plan: [place]   Discharge Planning Services: CM Consult   Living arrangements for the past 2 months: Apartment                           HH Arranged: RN HH Agency: [facility] Date [place]: 07/24/20 Time [place]: 1552 Representative spoke with at [place]: Rob ( voice message left)   Social Determinants of Health (SDOH) Interventions    Readmission Risk Interventions Readmission Risk Prevention Plan 02/02/2020  Transportation Screening Complete  PCP or Specialist Appt within 3-5 Days Complete  HRI or [place] Complete  Social Work Consult for [place] Planning/Counseling Complete  Palliative Care Screening Not Applicable  Medication Review Press photographer) Complete  Some recent data might be hidden

## 2020-07-26 ENCOUNTER — Telehealth: Payer: Self-pay | Admitting: Emergency Medicine

## 2020-07-26 ENCOUNTER — Other Ambulatory Visit (HOSPITAL_COMMUNITY): Payer: Self-pay

## 2020-07-26 LAB — BASIC METABOLIC PANEL
Anion gap: 7 (ref 5–15)
BUN: 18 mg/dL (ref 6–20)
CO2: 30 mmol/L (ref 22–32)
Calcium: 8.9 mg/dL (ref 8.9–10.3)
Chloride: 100 mmol/L (ref 98–111)
Creatinine, Ser: 0.5 mg/dL (ref 0.44–1.00)
GFR, Estimated: >60 mL/min (ref >60)
Glucose, Bld: 111 mg/dL — ABNORMAL HIGH (ref 70–99)
Potassium: 3.8 mmol/L (ref 3.5–5.1)
Sodium: 137 mmol/L (ref 135–145)

## 2020-07-26 LAB — CBC WITH DIFFERENTIAL/PLATELET
Abs Immature Granulocytes: 0.04 10*3/uL (ref 0.00–0.07)
Basophils Absolute: 0 10*3/uL (ref 0.0–0.1)
Basophils Relative: 0 %
Eosinophils Absolute: 0.4 10*3/uL (ref 0.0–0.5)
Eosinophils Relative: 5 %
HCT: 27.5 % — ABNORMAL LOW (ref 36.0–46.0)
Hemoglobin: 8.7 g/dL — ABNORMAL LOW (ref 12.0–15.0)
Immature Granulocytes: 1 %
Lymphocytes Relative: 19 %
Lymphs Abs: 1.4 10*3/uL (ref 0.7–4.0)
MCH: 27.4 pg (ref 26.0–34.0)
MCHC: 31.6 g/dL (ref 30.0–36.0)
MCV: 86.8 fL (ref 80.0–100.0)
Monocytes Absolute: 0.4 10*3/uL (ref 0.1–1.0)
Monocytes Relative: 6 %
Neutro Abs: 5.1 10*3/uL (ref 1.7–7.7)
Neutrophils Relative %: 69 %
Platelets: 232 10*3/uL (ref 150–400)
RBC: 3.17 MIL/uL — ABNORMAL LOW (ref 3.87–5.11)
RDW: 15.9 % — ABNORMAL HIGH (ref 11.5–15.5)
WBC: 7.4 10*3/uL (ref 4.0–10.5)
nRBC: 0 % (ref 0.0–0.2)

## 2020-07-26 LAB — MAGNESIUM: Magnesium: 1.8 mg/dL (ref 1.7–2.4)

## 2020-07-26 LAB — GLUCOSE, CAPILLARY
Glucose-Capillary: 123 mg/dL — ABNORMAL HIGH (ref 70–99)
Glucose-Capillary: 118 mg/dL — ABNORMAL HIGH (ref 70–99)
Glucose-Capillary: 107 mg/dL — ABNORMAL HIGH (ref 70–99)

## 2020-07-26 NOTE — TOC Transition Note (Signed)
Transition of Care [facility]) - CM/SW Discharge Note   Patient Details  Name: Kathleen Cameron MRN: [id number] Date of Birth: 10-20-1965  Transition of Care [facility]) CM/SW Contact:  Epifanio Lesches, RN Phone Number: 07/26/2020, 10:23 AM   Clinical Narrative:    Patient will DC to: home Anticipated DC date: 07/26/2020 Family notified: yes Transport by: Sharin Mons  Admitted with AMS and hypoxia, hx of COPD, chronic hypoxic respiratory failure on long-tern trach/vent, DVT on eliquis, hx UGIB, gout, and seizure disorder.  Per MD patient ready for DC today. RN, patient, patient's husband, and [facility] / Rob notified of DC. D/C summary and home health orders faxed to [facility] [phone]. [facility], [phone] [phone]) aware of d/c plan.  TOC pharmacy to deliver Rx Meds to pt's bedside prior to d/c.  Ambulance transport requested for patient, time pick up for 1 pm. Husband to accompany pt in ambulance. Transportation papers placed in front of chart per NCM.   RNCM will sign off for now as intervention is no longer needed. Please consult Onalee Hua again if new needs arise.    Final next level of care: Home w Home Health Services Barriers to Discharge: No Barriers Identified   Patient Goals and CMS Choice        Discharge Placement                       Discharge Plan and Services   Discharge Planning Services: CM Consult                      HH Arranged: RN [facility] Agency: [facility] Date [facility] Agency Contacted: 07/24/20 Time HH Agency Contacted: 1552 Representative spoke with at [facility] Agency: Rob ( voice message left)  Social Determinants of Health (SDOH) Interventions     Readmission Risk Interventions Readmission Risk Prevention Plan 02/02/2020  Transportation Screening Complete  PCP or Specialist Appt within 3-5 Days Complete  HRI or Home Care Consult Complete  Social Work Consult for [place] Care Planning/Counseling Complete  Palliative Care  Screening Not Applicable  Medication Review 02/04/2020) Complete  Some recent data might be hidden

## 2020-07-26 NOTE — Progress Notes (Signed)
Patient transported toward home with PTAR on 40% home vent. Sats 100% when leaving unit. Husband at bedside. Attentive and supportive.

## 2020-07-26 NOTE — Progress Notes (Signed)
eLink Physician-Brief Progress Note Patient Name: Kathleen Cameron DOB: July 17, 1965 MRN: [id number]   Date of Service  07/26/2020  HPI/Events of Note  Nursing request for AM labs.   eICU Interventions  Will order CBC with platelets, BMP and Mg++ level in AM.     Intervention Category Major Interventions: Other:  Lenell Antu 07/26/2020, 1:53 AM

## 2020-07-26 NOTE — Telephone Encounter (Signed)
Transition Care Management Unsuccessful Follow-up Telephone Call  Date of discharge and from where:  07/26/2020 from [facility]  Attempts:  1st Attempt  Reason for unsuccessful TCM follow-up call:  Left voice message     

## 2020-07-27 ENCOUNTER — Telehealth: Payer: Self-pay | Admitting: Emergency Medicine

## 2020-07-27 LAB — CULTURE, BLOOD (ROUTINE X 2): Culture: NO GROWTH

## 2020-07-27 NOTE — Telephone Encounter (Signed)
Transition Care Management Follow-up Telephone Call  Date of discharge and from where: 07/26/2020 from [facility]  How have you been since you were released from the hospital? States she is doing well  Any questions or concerns? No  Items Reviewed:  Did the pt receive and understand the discharge instructions provided? Yes   Medications obtained and verified? Yes   Other? No   Any new allergies since your discharge? No   Dietary orders reviewed? Yes  Do you have support at home? Yes   Home Care and Equipment/Supplies: Were home health services ordered? yes If so, what is the name of the agency? [facility]  Has the agency set up a time to come to the patient's home? yes Were any new equipment or medical supplies ordered?  No What is the name of the medical supply agency? N/A Were you able to get the supplies/equipment? not applicable Do you have any questions related to the use of the equipment or supplies? No  Functional Questionnaire: (I = Independent and D = Dependent) ADLs: D  Bathing/Dressing- D   Meal Prep- D   Eating- I  Maintaining continence- D  Transferring/Ambulation- D   Managing Meds- D  Follow up appointments reviewed:   PCP Hospital f/u appt confirmed? No    Specialist Hospital f/u appt confirmed? No    Are transportation arrangements needed? Yes   If their condition worsens, is the pt aware to call PCP or go to the Emergency Dept.? Yes  Was the patient provided with contact information for the PCP's office or ED? Yes  Was to pt encouraged to call back with questions or concerns? Yes

## 2020-07-27 NOTE — Telephone Encounter (Signed)
Transition Care Management Unsuccessful Follow-up Telephone Call  Date of discharge and from where:  07/26/2020  Attempts:  2nd Attempt  Reason for unsuccessful TCM follow-up call:  Left voice message

## 2020-07-27 NOTE — Telephone Encounter (Signed)
Patient/ spouse would like to know if appointment needs to be in office or can visit be done virtual. Patient must be transported by EMS for all visits.    Please advise

## 2020-07-30 ENCOUNTER — Encounter (HOSPITAL_COMMUNITY): Payer: Self-pay | Admitting: Emergency Medicine

## 2020-07-30 ENCOUNTER — Other Ambulatory Visit: Payer: Self-pay

## 2020-07-30 ENCOUNTER — Inpatient Hospital Stay (HOSPITAL_COMMUNITY)
Admission: EM | Admit: 2020-07-30 | Discharge: 2020-08-03 | DRG: 207 | Disposition: A | Discharge status: Home Health | Payer: Medicaid Other | Attending: Pulmonary Disease | Admitting: Pulmonary Disease

## 2020-07-30 ENCOUNTER — Emergency Department (HOSPITAL_COMMUNITY): Payer: Medicaid Other

## 2020-07-30 DIAGNOSIS — L89512 Pressure ulcer of right ankle, stage 2: Secondary | ICD-10-CM | POA: Diagnosis present

## 2020-07-30 DIAGNOSIS — Z79899 Other long term (current) drug therapy: Secondary | ICD-10-CM

## 2020-07-30 DIAGNOSIS — F32A Depression, unspecified: Secondary | ICD-10-CM | POA: Diagnosis present

## 2020-07-30 DIAGNOSIS — Z9911 Dependence on respirator [ventilator] status: Secondary | ICD-10-CM | POA: Diagnosis not present

## 2020-07-30 DIAGNOSIS — G4700 Insomnia, unspecified: Secondary | ICD-10-CM | POA: Diagnosis present

## 2020-07-30 DIAGNOSIS — J449 Chronic obstructive pulmonary disease, unspecified: Secondary | ICD-10-CM | POA: Diagnosis present

## 2020-07-30 DIAGNOSIS — R0603 Acute respiratory distress: Secondary | ICD-10-CM | POA: Diagnosis present

## 2020-07-30 DIAGNOSIS — G40909 Epilepsy, unspecified, not intractable, without status epilepticus: Secondary | ICD-10-CM | POA: Diagnosis present

## 2020-07-30 DIAGNOSIS — J9621 Acute and chronic respiratory failure with hypoxia: Principal | ICD-10-CM | POA: Diagnosis present

## 2020-07-30 DIAGNOSIS — Z888 Allergy status to other drugs, medicaments and biological substances status: Secondary | ICD-10-CM

## 2020-07-30 DIAGNOSIS — E440 Moderate protein-calorie malnutrition: Secondary | ICD-10-CM | POA: Diagnosis present

## 2020-07-30 DIAGNOSIS — Z930 Tracheostomy status: Secondary | ICD-10-CM

## 2020-07-30 DIAGNOSIS — Z86718 Personal history of other venous thrombosis and embolism: Secondary | ICD-10-CM | POA: Diagnosis not present

## 2020-07-30 DIAGNOSIS — G479 Sleep disorder, unspecified: Secondary | ICD-10-CM | POA: Diagnosis present

## 2020-07-30 DIAGNOSIS — F419 Anxiety disorder, unspecified: Secondary | ICD-10-CM | POA: Diagnosis present

## 2020-07-30 DIAGNOSIS — I10 Essential (primary) hypertension: Secondary | ICD-10-CM | POA: Diagnosis present

## 2020-07-30 DIAGNOSIS — L89151 Pressure ulcer of sacral region, stage 1: Secondary | ICD-10-CM | POA: Diagnosis present

## 2020-07-30 DIAGNOSIS — M109 Gout, unspecified: Secondary | ICD-10-CM | POA: Diagnosis present

## 2020-07-30 DIAGNOSIS — Z7901 Long term (current) use of anticoagulants: Secondary | ICD-10-CM | POA: Diagnosis not present

## 2020-07-30 DIAGNOSIS — L89312 Pressure ulcer of right buttock, stage 2: Secondary | ICD-10-CM | POA: Diagnosis present

## 2020-07-30 DIAGNOSIS — Z6820 Body mass index (BMI) 20.0-20.9, adult: Secondary | ICD-10-CM | POA: Diagnosis not present

## 2020-07-30 DIAGNOSIS — Z20822 Contact with and (suspected) exposure to covid-19: Secondary | ICD-10-CM | POA: Diagnosis present

## 2020-07-30 DIAGNOSIS — R069 Unspecified abnormalities of breathing: Secondary | ICD-10-CM

## 2020-07-30 DIAGNOSIS — Z931 Gastrostomy status: Secondary | ICD-10-CM | POA: Diagnosis not present

## 2020-07-30 DIAGNOSIS — Z79890 Hormone replacement therapy (postmenopausal): Secondary | ICD-10-CM | POA: Diagnosis not present

## 2020-07-30 DIAGNOSIS — E039 Hypothyroidism, unspecified: Secondary | ICD-10-CM | POA: Diagnosis present

## 2020-07-30 LAB — URINALYSIS, ROUTINE W REFLEX MICROSCOPIC
Bacteria, UA: NONE SEEN
Bilirubin Urine: NEGATIVE
Glucose, UA: 150 mg/dL — AB
Hgb urine dipstick: NEGATIVE
Ketones, ur: NEGATIVE mg/dL
Leukocytes,Ua: NEGATIVE
Nitrite: NEGATIVE
Protein, ur: >=300 mg/dL — AB
Specific Gravity, Urine: 1.013 (ref 1.005–1.030)
pH: 9 — ABNORMAL HIGH (ref 5.0–8.0)

## 2020-07-30 LAB — CBC WITH DIFFERENTIAL/PLATELET
Abs Immature Granulocytes: 0.44 10*3/uL — ABNORMAL HIGH (ref 0.00–0.07)
Basophils Absolute: 0.2 10*3/uL — ABNORMAL HIGH (ref 0.0–0.1)
Basophils Relative: 1 %
Eosinophils Absolute: 0.4 10*3/uL (ref 0.0–0.5)
Eosinophils Relative: 2 %
HCT: 40.8 % (ref 36.0–46.0)
Hemoglobin: 12.3 g/dL (ref 12.0–15.0)
Immature Granulocytes: 2 %
Lymphocytes Relative: 11 %
Lymphs Abs: 2.6 10*3/uL (ref 0.7–4.0)
MCH: 27.6 pg (ref 26.0–34.0)
MCHC: 30.1 g/dL (ref 30.0–36.0)
MCV: 91.5 fL (ref 80.0–100.0)
Monocytes Absolute: 0.8 10*3/uL (ref 0.1–1.0)
Monocytes Relative: 3 %
Neutro Abs: 19 10*3/uL — ABNORMAL HIGH (ref 1.7–7.7)
Neutrophils Relative %: 81 %
Platelets: 544 10*3/uL — ABNORMAL HIGH (ref 150–400)
RBC: 4.46 MIL/uL (ref 3.87–5.11)
RDW: 16 % — ABNORMAL HIGH (ref 11.5–15.5)
WBC: 23.3 10*3/uL — ABNORMAL HIGH (ref 4.0–10.5)
nRBC: 0 % (ref 0.0–0.2)

## 2020-07-30 LAB — RESP PANEL BY RT-PCR (FLU A&B, COVID) ARPGX2
Influenza A by PCR: NEGATIVE
Influenza B by PCR: NEGATIVE
SARS Coronavirus 2 by RT PCR: NEGATIVE

## 2020-07-30 LAB — COMPREHENSIVE METABOLIC PANEL
ALT: 17 U/L (ref 0–44)
AST: 35 U/L (ref 15–41)
Albumin: 3 g/dL — ABNORMAL LOW (ref 3.5–5.0)
Alkaline Phosphatase: 94 U/L (ref 38–126)
Anion gap: 8 (ref 5–15)
BUN: 12 mg/dL (ref 6–20)
CO2: 33 mmol/L — ABNORMAL HIGH (ref 22–32)
Calcium: 9.8 mg/dL (ref 8.9–10.3)
Chloride: 96 mmol/L — ABNORMAL LOW (ref 98–111)
Creatinine, Ser: 0.71 mg/dL (ref 0.44–1.00)
GFR, Estimated: >60 mL/min (ref >60)
Glucose, Bld: 162 mg/dL — ABNORMAL HIGH (ref 70–99)
Potassium: 4.5 mmol/L (ref 3.5–5.1)
Sodium: 137 mmol/L (ref 135–145)
Total Bilirubin: 0.8 mg/dL (ref 0.3–1.2)
Total Protein: 8.1 g/dL (ref 6.5–8.1)

## 2020-07-30 LAB — GLUCOSE, CAPILLARY
Glucose-Capillary: 84 mg/dL (ref 70–99)
Glucose-Capillary: 98 mg/dL (ref 70–99)

## 2020-07-30 LAB — BRAIN NATRIURETIC PEPTIDE: B Natriuretic Peptide: 45.8 pg/mL (ref 0.0–100.0)

## 2020-07-30 LAB — LACTIC ACID, PLASMA: Lactic Acid, Venous: 1.4 mmol/L (ref 0.5–1.9)

## 2020-07-30 MED ORDER — LEVOTHYROXINE SODIUM 50 MCG PO TABS
50.0000 ug | ORAL_TABLET | Freq: Every day | ORAL | Status: DC
  Administered 2020-07-31: 50 ug
  Filled 2020-07-30: qty 1

## 2020-07-30 MED ORDER — VANCOMYCIN HCL 1000 MG/200ML IV SOLN
1000.0000 mg | Freq: Once | INTRAVENOUS | Status: AC
  Administered 2020-07-30: 1000 mg via INTRAVENOUS
  Filled 2020-07-30: qty 200

## 2020-07-30 MED ORDER — SODIUM CHLORIDE 0.9 % IV SOLN
2.0000 g | Freq: Three times a day (TID) | INTRAVENOUS | Status: DC
  Administered 2020-07-30 – 2020-08-03 (×11): 2 g via INTRAVENOUS
  Filled 2020-07-30 (×11): qty 2

## 2020-07-30 MED ORDER — CLONAZEPAM 0.5 MG PO TABS: 1.0000 mg | ORAL_TABLET | Freq: Three times a day (TID) | ORAL | Status: DC

## 2020-07-30 MED ORDER — SODIUM CHLORIDE 0.9 % IV SOLN
1.0000 g | Freq: Once | INTRAVENOUS | Status: DC
  Filled 2020-07-30: qty 1

## 2020-07-30 MED ORDER — PANTOPRAZOLE SODIUM 40 MG IV SOLR
40.0000 mg | Freq: Every day | INTRAVENOUS | Status: DC
  Administered 2020-07-30: 40 mg via INTRAVENOUS
  Filled 2020-07-30: qty 40

## 2020-07-30 MED ORDER — VENLAFAXINE HCL 37.5 MG PO TABS
37.5000 mg | ORAL_TABLET | Freq: Two times a day (BID) | ORAL | Status: DC
  Administered 2020-07-30 – 2020-07-31 (×2): 37.5 mg
  Filled 2020-07-30 (×2): qty 1

## 2020-07-30 MED ORDER — ORAL CARE MOUTH RINSE
15.0000 mL | OROMUCOSAL | Status: DC
  Administered 2020-07-30 – 2020-08-03 (×26): 15 mL via OROMUCOSAL

## 2020-07-30 MED ORDER — LACTATED RINGERS IV SOLN: INTRAVENOUS | Status: DC

## 2020-07-30 MED ORDER — ARFORMOTEROL TARTRATE 15 MCG/2ML IN NEBU
15.0000 ug | INHALATION_SOLUTION | Freq: Two times a day (BID) | RESPIRATORY_TRACT | Status: DC
  Administered 2020-07-31 – 2020-08-03 (×7): 15 ug via RESPIRATORY_TRACT
  Filled 2020-07-30 (×7): qty 2

## 2020-07-30 MED ORDER — DOCUSATE SODIUM 100 MG PO CAPS: 100.0000 mg | ORAL_CAPSULE | Freq: Two times a day (BID) | ORAL | Status: DC | PRN

## 2020-07-30 MED ORDER — CHLORHEXIDINE GLUCONATE 0.12% ORAL RINSE (MEDLINE KIT)
15.0000 mL | Freq: Two times a day (BID) | OROMUCOSAL | Status: DC
  Administered 2020-07-30 – 2020-08-03 (×8): 15 mL via OROMUCOSAL

## 2020-07-30 MED ORDER — ACETAMINOPHEN 325 MG PO TABS: 650.0000 mg | ORAL_TABLET | ORAL | Status: DC | PRN

## 2020-07-30 MED ORDER — SODIUM CHLORIDE 0.9 % IV SOLN
2.0000 g | Freq: Once | INTRAVENOUS | Status: AC
  Administered 2020-07-30: 2 g via INTRAVENOUS
  Filled 2020-07-30: qty 2

## 2020-07-30 MED ORDER — ONDANSETRON HCL 4 MG/2ML IJ SOLN: 4.0000 mg | Freq: Four times a day (QID) | INTRAMUSCULAR | Status: DC | PRN

## 2020-07-30 MED ORDER — ONDANSETRON HCL 4 MG/2ML IJ SOLN: 4.0000 mg | Freq: Once | INTRAMUSCULAR | Status: DC

## 2020-07-30 MED ORDER — LACTATED RINGERS IV BOLUS
500.0000 mL | Freq: Once | INTRAVENOUS | Status: AC
  Administered 2020-07-30: 500 mL via INTRAVENOUS

## 2020-07-30 MED ORDER — POLYETHYLENE GLYCOL 3350 17 G PO PACK: 17.0000 g | PACK | Freq: Every day | ORAL | Status: DC | PRN

## 2020-07-30 MED ORDER — REVEFENACIN 175 MCG/3ML IN SOLN
175.0000 ug | Freq: Every day | RESPIRATORY_TRACT | Status: DC
  Administered 2020-07-31 – 2020-08-03 (×4): 175 ug via RESPIRATORY_TRACT
  Filled 2020-07-30 (×4): qty 3

## 2020-07-30 MED ORDER — BUDESONIDE 0.25 MG/2ML IN SUSP
0.2500 mg | Freq: Two times a day (BID) | RESPIRATORY_TRACT | Status: DC
  Administered 2020-07-31 – 2020-08-03 (×7): 0.25 mg via RESPIRATORY_TRACT
  Filled 2020-07-30 (×7): qty 2

## 2020-07-30 MED ORDER — SENNOSIDES-DOCUSATE SODIUM 8.6-50 MG PO TABS: 2.0000 | ORAL_TABLET | Freq: Every evening | ORAL | Status: DC | PRN

## 2020-07-30 MED ORDER — ROSUVASTATIN CALCIUM 20 MG PO TABS
40.0000 mg | ORAL_TABLET | Freq: Every day | ORAL | Status: DC
  Administered 2020-07-31: 40 mg
  Filled 2020-07-30: qty 2

## 2020-07-30 MED ORDER — ONDANSETRON HCL 4 MG/2ML IJ SOLN
4.0000 mg | Freq: Once | INTRAMUSCULAR | Status: AC
  Administered 2020-07-30: 4 mg via INTRAVENOUS
  Filled 2020-07-30: qty 2

## 2020-07-30 MED ORDER — LEVOTHYROXINE SODIUM 50 MCG PO TABS: 50.0000 ug | ORAL_TABLET | Freq: Every day | ORAL | Status: DC

## 2020-07-30 MED ORDER — METOCLOPRAMIDE HCL 5 MG/5ML PO SOLN
10.0000 mg | Freq: Two times a day (BID) | ORAL | Status: DC
  Administered 2020-07-30: 10 mg
  Filled 2020-07-30 (×2): qty 10

## 2020-07-30 MED ORDER — TEMAZEPAM 7.5 MG PO CAPS
15.0000 mg | ORAL_CAPSULE | Freq: Every evening | ORAL | Status: DC | PRN
  Filled 2020-07-30: qty 2

## 2020-07-30 MED ORDER — VANCOMYCIN HCL 1000 MG/200ML IV SOLN: 1000.0000 mg | INTRAVENOUS | Status: DC

## 2020-07-30 NOTE — ED Provider Notes (Signed)
[facility] EMERGENCY DEPARTMENT Provider Note   CSN: [id number] Arrival date & time: 07/30/20  1048     History Chief Complaint  Patient presents with  . Respiratory Distress    Jansen Goodpasture is a 55 y.o. female.  HPI Patient presents from home via EMS due to dyspnea and blood in her tracheostomy equipment. The patient herself is dyspneic, offers only minimal, slightly audible responses to specific questions, cannot describe in detail any details of her history, level 5 caveat secondary to acuity of condition. Per EMS the patient today had dyspnea, and was found to have bloody material on the inside of her tracheostomy tubing.  Though she personnel, no report of fever.  She was, hypertensive, with tachypnea in route, while receiving mechanical ventilation she is on home mechanical ventilation and she has history of mild issues including chronic failure, seizure disorder, episodes of encephalopathy.  Additional details of her history obtained on chart review, particularly from discharge diagnosis of last week, details below:  History of Present Illness:               55 yo F PMH COPD, chronic hypoxic respiratory failure on long-tern trach/vent, DVT on eliquis, hx UGIB, gout, and seizure disorder. She has had recurrent hospital presentations with home ventilation complications (3/25 ED for SOB and chest pain, Admit 3/15- 3/21 after mucus plug, 2/13-3/15 for resp distress on home vent after 2/13 [facility]). It has been recommended that pt dc to vent SNF during previous admissions but patient and husband have declined all destinations except home with [facility].                She again presented to ED 4/2 from home for AMS and hypoxia, possible respiratory failure. She has been admitted and discharged to home twice this year so far with home vent and has been unable to remain home for more than a day each time. She was found unresponsive and hypoxic by home  health nurse, was not sure if pt had pulse-- chest compressions started.      Past Medical History:  Diagnosis Date  . Acute on chronic respiratory failure with hypoxia (HCC)   . Anxiety disorder   . Asthma   . COPD (chronic obstructive pulmonary disease) (HCC)   . COPD, severe (HCC)   . Hypertension   . Seizure disorder [facility])     Patient Active Problem List   Diagnosis Date Noted  . Aspiration pneumonia (HCC)   . Goals of care, counseling/discussion   . Ileus (HCC)   . Acute on chronic respiratory failure with hypoxia (HCC) 07/21/2020  . Acute on chronic respiratory failure with hypercapnia (HCC) 07/03/2020  . Tracheostomy status (HCC)   . Sepsis (HCC) 06/03/2020  . Healthcare-associated pneumonia 06/03/2020  . Pressure injury of skin 06/03/2020  . Acute respiratory failure (HCC)   . Seizure disorder (HCC)   . COPD, severe (HCC)   . Anxiety disorder   . Generalized abdominal pain   . Convulsions (HCC)   . Malnutrition of moderate degree 02/27/2020  . Hypotension   . Acute hypercapnic respiratory failure (HCC) 02/25/2020  . Right upper quadrant abdominal pain 02/01/2020  . Hypothyroidism (acquired) 02/01/2020  . Acute metabolic encephalopathy 01/29/2020  . Hyponatremia 01/28/2020  . Anxiety 12/02/2019  . COPD exacerbation (HCC) 11/06/2019  . Depression 11/06/2019  . Acute on chronic respiratory failure with hypoxia and hypercapnia (HCC) 11/06/2019  . Current moderate episode of major depressive disorder without prior  episode ([place]) 09/20/2019  . Pedal edema 08/04/2019  . Hypokalemia 04/13/2019  . Chronic respiratory failure with hypoxia ([place]) 03/07/2019  . CKD (chronic kidney disease) 01/05/2019  . COPD (chronic obstructive pulmonary disease) ([place]) 09/29/2018  . Pulmonary nodules 09/27/2018  . Dyslipidemia 07/07/2018  . Essential hypertension 03/10/2018    Past Surgical History:  Procedure Laterality Date  . COLONOSCOPY N/A 06/26/2020   Procedure: COLONOSCOPY;   Surgeon: Carol Ada, MD;  Location: [place];  Service: Endoscopy;  Laterality: N/A;  . ENTEROSCOPY N/A 06/26/2020   Procedure: ENTEROSCOPY;  Surgeon: Carol Ada, MD;  Location: [place];  Service: Endoscopy;  Laterality: N/A;  . HEMOSTASIS CLIP PLACEMENT  06/26/2020   Procedure: HEMOSTASIS CLIP PLACEMENT;  Surgeon: Carol Ada, MD;  Location: [place];  Service: Endoscopy;;  . HOT HEMOSTASIS N/A 06/26/2020   Procedure: HOT HEMOSTASIS (ARGON PLASMA COAGULATION/BICAP);  Surgeon: Carol Ada, MD;  Location: [place];  Service: Endoscopy;  Laterality: N/A;  . IR FLUORO RM 30-60 MIN  03/23/2020  . LAPAROSCOPIC INSERTION GASTROSTOMY TUBE N/A 04/24/2020   Procedure: LAPAROSCOPIC GASTROSTOMY TUBE PLACEMENT;  Surgeon: Kieth Brightly Arta Bruce, MD;  Location: [place];  Service: General;  Laterality: N/A;  . POLYPECTOMY  06/26/2020   Procedure: POLYPECTOMY;  Surgeon: Carol Ada, MD;  Location: [facility] ENDOSCOPY;  Service: Endoscopy;;  . WISDOM TOOTH EXTRACTION       OB History   No obstetric history on file.     Family History  Family history unknown: Yes    Social History   Tobacco Use  . Smoking status: Never Smoker  . Smokeless tobacco: Never Used  Vaping Use  . Vaping Use: Never used  Substance Use Topics  . Alcohol use: Yes    Alcohol/week: 2.0 standard drinks    Types: 2 Cans of beer per week  . Drug use: Never    Home Medications Prior to Admission medications   Medication Sig Start Date End Date Taking? Authorizing Provider  apixaban (ELIQUIS) 5 MG TABS tablet Place 1 tablet (5 mg total) into feeding tube 2 (two) times daily. 07/02/20 10/30/20  Kayleen Memos, DO  arformoterol (BROVANA) 15 MCG/2ML NEBU Take 2 mLs (15 mcg total) by nebulization 2 (two) times daily. 07/10/20   Candee Furbish, MD  bisacodyl (DULCOLAX) 10 MG suppository Place 1 suppository (10 mg total) rectally daily as needed for severe constipation. 07/10/20   Horald Pollen, MD  budesonide  (PULMICORT) 0.25 MG/2ML nebulizer solution Take 2 mLs (0.25 mg total) by nebulization 2 (two) times daily. 07/10/20   Candee Furbish, MD  Chlorhexidine Gluconate Cloth 2 % PADS Apply 6 each topically daily. 03/09/20   Candee Furbish, MD  chlorhexidine gluconate, MEDLINE KIT, (PERIDEX) 0.12 % solution 15 mLs by Mouth Rinse route 2 (two) times daily. 07/10/20   Candee Furbish, MD  clonazePAM (KLONOPIN) 1 MG tablet Place 1 tablet (1 mg total) into feeding tube 3 (three) times daily. 07/10/20   Horald Pollen, MD  levothyroxine (SYNTHROID) 50 MCG tablet Take 1 tablet (50 mcg total) by mouth daily at 6 (six) AM. 02/03/20   Charlynne Cousins, MD  lip balm (CARMEX) ointment Apply topically as needed for lip care. 03/08/20   Candee Furbish, MD  metoCLOPramide (REGLAN) 5 MG/5ML solution Place 10 mLs (10 mg total) into feeding tube in the morning and at bedtime. 07/25/20   Candee Furbish, MD  Multiple Vitamin (MULTIVITAMIN WITH MINERALS) TABS tablet Place 1 tablet into feeding tube  daily. 07/03/20 10/01/20  Kayleen Memos, DO  Nutritional Supplements (ENSURE ACTIVE HIGH PROTEIN) LIQD Take 1 Can by mouth in the morning and at bedtime. 07/09/20   Barb Merino, MD  Nutritional Supplements (FEEDING SUPPLEMENT, OSMOLITE 1.2 CAL,) LIQD Place 1,000 mLs into feeding tube continuous. 07/10/20   Horald Pollen, MD  oxyCODONE-acetaminophen (PERCOCET/ROXICET) 5-325 MG tablet PLACE 1 TABLET INTO FEEDING TUBE EVERY EIGHT HOURS AS NEEDED FOR UP TO 3 DAYS FOR SEVERE PAIN. 07/02/20 12/29/20  Kayleen Memos, DO  pantoprazole sodium (PROTONIX) 40 mg/20 mL PACK Place 20 mLs (40 mg total) into feeding tube at bedtime. 07/10/20   Horald Pollen, MD  phenol (CHLORASEPTIC) 1.4 % LIQD Use as directed 1 spray in the mouth or throat as needed for throat irritation / pain. 03/08/20   Candee Furbish, MD  polyethylene glycol (MIRALAX / GLYCOLAX) 17 g packet Place 17 g into feeding tube daily as needed for mild  constipation. 03/08/20   Candee Furbish, MD  revefenacin (YUPELRI) 175 MCG/3ML nebulizer solution Take 3 mLs (175 mcg total) by nebulization daily. 07/10/20   Candee Furbish, MD  rosuvastatin (CRESTOR) 40 MG tablet Place 1 tablet (40 mg total) into feeding tube daily. 07/10/20   Horald Pollen, MD  senna-docusate (SENOKOT-S) 8.6-50 MG tablet Place 2 tablets into feeding tube at bedtime as needed for moderate constipation. 03/08/20   Candee Furbish, MD  sodium chloride (OCEAN) 0.65 % SOLN nasal spray Place 1 spray into both nostrils as needed for congestion. 03/08/20   Candee Furbish, MD  sorbitol 70 % SOLN Place 30 mLs into feeding tube daily as needed for moderate constipation. 07/25/20   Candee Furbish, MD  temazepam (RESTORIL) 15 MG capsule Place 1 capsule (15 mg total) into feeding tube at bedtime as needed for sleep. 07/20/20   Horald Pollen, MD  venlafaxine [facility]) 37.5 MG tablet Place 1 tablet (37.5 mg total) into feeding tube 2 (two) times daily with a meal. 07/20/20 10/18/20  Sagardia, Ines Bloomer, MD    Allergies    Stiolto respimat [tiotropium bromide-olodaterol]  Review of Systems   Review of Systems  Unable to perform ROS: Acuity of condition    Physical Exam Updated Vital Signs BP 95/72   Pulse (!) 120   Temp 98.1 F (36.7 C) (Temporal)   Resp 16   SpO2 97%   Physical Exam Vitals and nursing note reviewed.  Constitutional:      Comments: Chronically ill-appearing vent/trach dependent  HENT:     Head: Normocephalic.     Mouth/Throat:     Comments: Trach appears intact Eyes:     Pupils: Pupils are equal, round, and reactive to light.  Cardiovascular:     Rate and Rhythm: Tachycardia present.     Pulses: Normal pulses.  Pulmonary:     Effort: Respiratory distress present.     Comments: Breath sounds minimal bilaterally with rhonchorous findings Chest:     Chest wall: No tenderness or crepitus.  Abdominal:     Palpations: Abdomen is soft.      Comments: Soft nontender  Musculoskeletal:     Cervical back: Normal range of motion.     Right lower leg: No edema.     Left lower leg: No edema.  Skin:    General: Skin is warm.  Neurological:     General: No focal deficit present.     Comments: Alert, following commands      ED  Results / Procedures / Treatments   Labs (all labs ordered are listed, but only abnormal results are displayed) Labs Reviewed  COMPREHENSIVE METABOLIC PANEL - Abnormal; Notable for the following components:      Result Value   Chloride 96 (*)    CO2 33 (*)    Glucose, Bld 162 (*)    Albumin 3.0 (*)    All other components within normal limits  CBC WITH DIFFERENTIAL/PLATELET - Abnormal; Notable for the following components:   WBC 23.3 (*)    RDW 16.0 (*)    Platelets 544 (*)    Neutro Abs 19.0 (*)    Basophils Absolute 0.2 (*)    Abs Immature Granulocytes 0.44 (*)    All other components within normal limits  URINALYSIS, ROUTINE W REFLEX MICROSCOPIC - Abnormal; Notable for the following components:   APPearance HAZY (*)    pH 9.0 (*)    Glucose, UA 150 (*)    Protein, ur >=300 (*)    All other components within normal limits  RESP PANEL BY RT-PCR (FLU A&B, COVID) ARPGX2  CULTURE, BLOOD (SINGLE)  CULTURE, RESPIRATORY W GRAM STAIN  BRAIN NATRIURETIC PEPTIDE  LACTIC ACID, PLASMA    EKG None  Radiology DG Chest Port 1 View  Result Date: 07/30/2020 CLINICAL DATA:  Shortness of breath Respiratory distress EXAM: PORTABLE CHEST 1 VIEW COMPARISON:  07/21/2020 FINDINGS: Heart size within normal limits. Lungs are hyperexpanded. Unchanged blunting of the costophrenic angles may be due to scarring or small pleural effusions. Interval improvement in aeration of the lungs with mild patchy opacities still remaining at the left mid lung. Tracheostomy cannula unchanged in position. IMPRESSION: Interval improvement in aeration of the lungs with mild patchy opacities still remaining in the left mid lung  indicative of resolving pneumonia. Follow-up chest x-ray recommended 4-6 weeks to document resolution of the left mid lung abnormality. Electronically Signed   By: Miachel Roux M.D.   On: 07/30/2020 11:14    Procedures Procedures   Medications Ordered in ED Medications  ceFEPIme (MAXIPIME) 2 g in sodium chloride 0.9 % 100 mL IVPB (2 g Intravenous New Bag/Given 07/30/20 1337)  vancomycin (VANCOREADY) IVPB 1000 mg/200 mL (has no administration in time range)  ondansetron (ZOFRAN) injection 4 mg (4 mg Intravenous Given 07/30/20 1112)    ED Course  I have reviewed the triage vital signs and the nursing notes.  Pertinent labs & imaging results that were available during my care of the patient were reviewed by me and considered in my medical decision making (see chart for details).  On arrival, with EMS, we discussed the patient's presentation, history and she was transferred to our monitoring equipment, continuous cardiac, pulse oximetry. Patient noted to be hypoxic in route, transiently, but largely appropriate saturations with mechanical ventilation.  Here, the patient was stretched to our devices, had a 96% with mechanical ventilation. Cardiac 120s, sinus, abnormal  With concern for mechanical obstruction patient had suctioning performed.  2:06 PM Patient markedly better.  She continues require mechanical ventilation, has notable tachycardia, 140s. However, the patient's mentation has improved, she is more interactive. I discussed her case with our critical care colleagues. They are familiar with her, charge from their service week. With x-ray reviewed, concerning for pneumonia, though this is slightly better from most recent study, with note of leukocytosis, the patient will start broad-spectrum antibiotics per Given concern for her respiratory compromise, the patient we admitted to the ICU for ongoing monitoring, management.   MDM Rules/Calculators/A&P  MDM Number of Diagnoses or  Management Options Respiratory distress: established, worsening   Amount and/or Complexity of Data Reviewed Clinical lab tests: reviewed and ordered Tests in the radiology section of CPT: ordered and reviewed Tests in the medicine section of CPT: ordered and reviewed Decide to obtain previous medical records or to obtain history from someone other than the patient: yes Obtain history from someone other than the patient: yes Review and summarize past medical records: yes Discuss the patient with other providers: yes Independent visualization of images, tracings, or specimens: yes  Risk of Complications, Morbidity, and/or Mortality Presenting problems: high Diagnostic procedures: high Management options: high  Critical Care Total time providing critical care: 30-74 minutes (35)  Patient Progress Patient progress: stable  Final Clinical Impression(s) / ED Diagnoses Final diagnoses:  Respiratory distress     Carmin Muskrat, MD 07/30/20 1407

## 2020-07-30 NOTE — H&P (Signed)
NAME:  Kathleen Cameron, MRN:  [id number], DOB:  01-07-1966, LOS: 0 ADMISSION DATE:  07/30/2020, CONSULTATION DATE:  07/30/20 REFERRING MD:  Vanita Panda - EM, CHIEF COMPLAINT:  Hypoxia   History of Present Illness:  55 yo F well known to PCCM with PMH chronic hypoxic respiratory failure on home trach/vent, DVT, UGIB, Seizure disorder who presents to ED 4/11 with worse hypoxia, SOB, increased respiratory secretions and some bloody secretions from trach. Secretion load has increased at home since last discharge, bloody secretions have been ongoing for a few days. Patient says she "panicked" today which she thinks caused her hypoxia. In ED 4/11, pt started on abx for WBC 23.3 + thick respiratory secretions. CXR looks about the same as most recent admission, possibly some improvement.   Trach/vent poorly managed at home, with frequent complications (Recently admitted 4/2-4/7 after mucus plug causing respiratory arrest and tx for Corynebacterium straitum PNA and ileus, ED 3/25, Admission 3/15-3/21, admission 2/13-3/15) -- pt and family adamantly refuse all destinations except for home, with home health.    Pertinent  Medical History  Chronic hypoxic repiratory failure  VDRF DVT GIB Ileus  Significant Hospital Events: Including procedures, antibiotic start and stop dates in addition to other pertinent events   . 4/11 Admitted after episode of worse hypoxia at home and increased secretion burden.   Interim History / Subjective:  FiO2 decreased to 60%  Patient tearful in conversation with PCCM   Objective   Blood pressure 95/72, pulse (!) 120, temperature 98.1 F (36.7 C), temperature source Temporal, resp. rate 16, SpO2 97 %.    Vent Mode: (P) PRVC FiO2 (%):  [100 %] 100 % Set Rate:  [16 bmp] (P) 16 bmp Vt Set:  [480 mL] (P) 480 mL  No intake or output data in the 24 hours ending 07/30/20 1340 There were no vitals filed for this visit.  Examination: General: chronically ill middle aged  F, appears older than stated age and frail HENT: [place]. Poor dentition. Anicteric sclera. trach secure  Lungs: Symmetrical chest expansion, mechanically ventilated, moderate blood tinged respiratory secretions  Cardiovascular: tachycardic rate reg rhythm, 2+ radial pulses, cap refill < 3seconds  Abdomen: soft, slightly distended, non-tender +bowel sounds  Extremities: Symmetrically decreased muscle mass. No acute joint deformity, no cyanosis  Neuro: Awake and alert, following commands. PERRL GU: defer   Labs/imaging that I havepersonally reviewed  (right click and "Reselect all SmartList Selections" daily)  4/11 - CBC significant for WBC 23. CMP grossly normal -- albumin low at 3, glucose slightly elevated 162 4/11 CXR - persistent bilateral opacities, might be a little better thas  Previous. Hyperinflated lungs   Resolved Hospital Problem list     Assessment & Plan:   Acute on chronic respiratory failure with Hypoxia VDRF COPD Recent  PNA Blood tinged secretions (suspect suction trauma) -comes to ED 4/11 with worse SOB, secretions and hypoxia at home P -aggressive pulm hygiene  -will get trach aspirate but broad abx including corynebacterium straitum coverage in meantime, narrow as able  -hold eliquis for now  Leukocytosis- 23 on admission 4/11, was 7 prior to last discharge. Afebrile.  -worry for PNA with secretions but certainly at risk for other etiologies with fq hospital presentations -last admission had Corynebacterium straitum P - broad abx -BCx, Trach aspirate  Hypotension -IVF  Recent DVT -hole eliquis with bloody secretions and re-evaluate 4/12  Seizure disorder -TID clonazepam  Depression Sleep disorder  -effexor, restoril   GOC -most recent admission, PCM consulted  and pt endorsed desire for aggressive tx -frequent readmissions, poor home management of trach vent and adamant refusal of placement in a vent facility -I think we are at a difficult crux  where the ideals per pt (to successfully manage severe chronic illness and to remain at home) are not congruent. Pt has unfortunately failed multiple attempts at management with home health.    Best practice (right click and "Reselect all SmartList Selections" daily)  Diet:  Oral Pain/Anxiety/Delirium protocol (if indicated): No VAP protocol (if indicated): Yes DVT prophylaxis: Systemic AC eliquis GI prophylaxis: PPI Glucose control:  SSI No Central venous access:  N/A Arterial line:  N/A Foley:  N/A Mobility:  OOB  PT consulted: Yes Last date of multidisciplinary goals of care discussion [pending ] Code Status:  full code Disposition: admit to ICU   Labs   CBC: Recent Labs  Lab 07/23/20 1457 07/24/20 0127 07/25/20 0201 07/26/20 0255 07/30/20 1110  WBC 11.6* 9.0 9.5 7.4 23.3*  NEUTROABS  --   --   --  5.1 19.0*  HGB 8.6* 8.3* 9.1* 8.7* 12.3  HCT 27.2* 25.9* 28.2* 27.5* 40.8  MCV 86.9 85.8 87.3 86.8 91.5  PLT 230 208 220 232 544*    Basic Metabolic Panel: Recent Labs  Lab 07/24/20 0127 07/25/20 0201 07/26/20 0255 07/30/20 1110  NA 138  --  137 137  K 3.3*  --  3.8 4.5  CL 104  --  100 96*  CO2 26  --  30 33*  GLUCOSE 92  --  111* 162*  BUN 15  --  18 12  CREATININE 0.69  --  0.50 0.71  CALCIUM 9.2  --  8.9 9.8  MG 1.8 2.0 1.8  --   PHOS 2.6 2.7  --   --    GFR: Estimated Creatinine Clearance: 60.9 mL/min (by C-G formula based on SCr of 0.71 mg/dL). Recent Labs  Lab 07/24/20 0127 07/25/20 0201 07/26/20 0255 07/30/20 1100 07/30/20 1110  WBC 9.0 9.5 7.4  --  23.3*  LATICACIDVEN  --   --   --  1.4  --     Liver Function Tests: Recent Labs  Lab 07/30/20 1110  AST 35  ALT 17  ALKPHOS 94  BILITOT 0.8  PROT 8.1  ALBUMIN 3.0*   No results for input(s): LIPASE, AMYLASE in the last 168 hours. No results for input(s): AMMONIA in the last 168 hours.  ABG    Component Value Date/Time   PHART 7.386 07/12/2020 2019   PCO2ART 55.8 (H) 07/12/2020 2019    PO2ART 90 07/12/2020 2019   HCO3 33.4 (H) 07/12/2020 2019   TCO2 35 (H) 07/12/2020 2019   O2SAT 96.0 07/12/2020 2019     Coagulation Profile: No results for input(s): INR, PROTIME in the last 168 hours.  Cardiac Enzymes: No results for input(s): CKTOTAL, CKMB, CKMBINDEX, TROPONINI in the last 168 hours.  HbA1C: Hgb A1c MFr Bld  Date/Time Value Ref Range Status  06/21/2020 02:24 AM 5.3 4.8 - 5.6 % Final    Comment:    (NOTE) Pre diabetes:          5.7%-6.4%  Diabetes:              >6.4%  Glycemic control for   <7.0% adults with diabetes   02/26/2020 05:18 PM 5.4 4.8 - 5.6 % Final    Comment:    (NOTE) Pre diabetes:          5.7%-6.4%  Diabetes:              >  6.4%  Glycemic control for   <7.0% adults with diabetes     CBG: Recent Labs  Lab 07/25/20 1948 07/25/20 2353 07/26/20 0328 07/26/20 0740 07/26/20 1136  GLUCAP 97 129* 123* 118* 107*    Review of Systems:   Review of Systems  Constitutional: Negative.   HENT: Negative.   Eyes: Negative.   Respiratory: Positive for cough, hemoptysis, sputum production and shortness of breath. Negative for wheezing.   Cardiovascular: Negative.   Gastrointestinal: Negative.   Genitourinary: Negative.   Musculoskeletal: Negative.   Skin: Negative.   Neurological: Negative.   Endo/Heme/Allergies: Negative.   Psychiatric/Behavioral: Positive for depression. Negative for hallucinations, memory loss, substance abuse and suicidal ideas. The patient has insomnia. The patient is not nervous/anxious.      Past Medical History:  She,  has a past medical history of Acute on chronic respiratory failure with hypoxia ([place]), Anxiety disorder, Asthma, COPD (chronic obstructive pulmonary disease) ([place]), COPD, severe ([place]), Hypertension, and Seizure disorder ([place]).   Surgical History:   Past Surgical History:  Procedure Laterality Date  . COLONOSCOPY N/A 06/26/2020   Procedure: COLONOSCOPY;  Surgeon: Carol Ada, MD;   Location: [place];  Service: Endoscopy;  Laterality: N/A;  . ENTEROSCOPY N/A 06/26/2020   Procedure: ENTEROSCOPY;  Surgeon: Carol Ada, MD;  Location: [place];  Service: Endoscopy;  Laterality: N/A;  . HEMOSTASIS CLIP PLACEMENT  06/26/2020   Procedure: HEMOSTASIS CLIP PLACEMENT;  Surgeon: Carol Ada, MD;  Location: [place];  Service: Endoscopy;;  . HOT HEMOSTASIS N/A 06/26/2020   Procedure: HOT HEMOSTASIS (ARGON PLASMA COAGULATION/BICAP);  Surgeon: Carol Ada, MD;  Location: [place];  Service: Endoscopy;  Laterality: N/A;  . IR FLUORO RM 30-60 MIN  03/23/2020  . LAPAROSCOPIC INSERTION GASTROSTOMY TUBE N/A 04/24/2020   Procedure: LAPAROSCOPIC GASTROSTOMY TUBE PLACEMENT;  Surgeon: Kieth Brightly Arta Bruce, MD;  Location: [place];  Service: General;  Laterality: N/A;  . POLYPECTOMY  06/26/2020   Procedure: POLYPECTOMY;  Surgeon: Carol Ada, MD;  Location: [place];  Service: Endoscopy;;  . WISDOM TOOTH EXTRACTION       Social History:   reports that she has never smoked. She has never used smokeless tobacco. She reports current alcohol use of about 2.0 standard drinks of alcohol per week. She reports that she does not use drugs.   Family History:  Her Family history is unknown by patient.   Allergies Allergies  Allergen Reactions  . Stiolto Respimat [Tiotropium Bromide-Olodaterol] Other (See Comments)    Severe headaches and rapid heartrate     Home Medications  Prior to Admission medications   Medication Sig Start Date End Date Taking? Authorizing Provider  apixaban (ELIQUIS) 5 MG TABS tablet Place 1 tablet (5 mg total) into feeding tube 2 (two) times daily. 07/02/20 10/30/20  Kayleen Memos, DO  arformoterol (BROVANA) 15 MCG/2ML NEBU Take 2 mLs (15 mcg total) by nebulization 2 (two) times daily. 07/10/20   Candee Furbish, MD  bisacodyl (DULCOLAX) 10 MG suppository Place 1 suppository (10 mg total) rectally daily as needed for severe constipation. 07/10/20   Horald Pollen, MD  budesonide (PULMICORT) 0.25 MG/2ML nebulizer solution Take 2 mLs (0.25 mg total) by nebulization 2 (two) times daily. 07/10/20   Candee Furbish, MD  Chlorhexidine Gluconate Cloth 2 % PADS Apply 6 each topically daily. 03/09/20   Candee Furbish, MD  chlorhexidine gluconate, MEDLINE KIT, (PERIDEX) 0.12 % solution 15 mLs by Mouth Rinse route 2 (two) times daily. 07/10/20  Candee Furbish, MD  clonazePAM (KLONOPIN) 1 MG tablet Place 1 tablet (1 mg total) into feeding tube 3 (three) times daily. 07/10/20   Horald Pollen, MD  levothyroxine (SYNTHROID) 50 MCG tablet Take 1 tablet (50 mcg total) by mouth daily at 6 (six) AM. 02/03/20   Charlynne Cousins, MD  lip balm (CARMEX) ointment Apply topically as needed for lip care. 03/08/20   Candee Furbish, MD  metoCLOPramide (REGLAN) 5 MG/5ML solution Place 10 mLs (10 mg total) into feeding tube in the morning and at bedtime. 07/25/20   Candee Furbish, MD  Multiple Vitamin (MULTIVITAMIN WITH MINERALS) TABS tablet Place 1 tablet into feeding tube daily. 07/03/20 10/01/20  Kayleen Memos, DO  Nutritional Supplements (ENSURE ACTIVE HIGH PROTEIN) LIQD Take 1 Can by mouth in the morning and at bedtime. 07/09/20   Barb Merino, MD  Nutritional Supplements (FEEDING SUPPLEMENT, OSMOLITE 1.2 CAL,) LIQD Place 1,000 mLs into feeding tube continuous. 07/10/20   Horald Pollen, MD  oxyCODONE-acetaminophen (PERCOCET/ROXICET) 5-325 MG tablet PLACE 1 TABLET INTO FEEDING TUBE EVERY EIGHT HOURS AS NEEDED FOR UP TO 3 DAYS FOR SEVERE PAIN. 07/02/20 12/29/20  Kayleen Memos, DO  pantoprazole sodium (PROTONIX) 40 mg/20 mL PACK Place 20 mLs (40 mg total) into feeding tube at bedtime. 07/10/20   Horald Pollen, MD  phenol (CHLORASEPTIC) 1.4 % LIQD Use as directed 1 spray in the mouth or throat as needed for throat irritation / pain. 03/08/20   Candee Furbish, MD  polyethylene glycol (MIRALAX / GLYCOLAX) 17 g packet Place 17 g into feeding tube daily as  needed for mild constipation. 03/08/20   Candee Furbish, MD  revefenacin (YUPELRI) 175 MCG/3ML nebulizer solution Take 3 mLs (175 mcg total) by nebulization daily. 07/10/20   Candee Furbish, MD  rosuvastatin (CRESTOR) 40 MG tablet Place 1 tablet (40 mg total) into feeding tube daily. 07/10/20   Horald Pollen, MD  senna-docusate (SENOKOT-S) 8.6-50 MG tablet Place 2 tablets into feeding tube at bedtime as needed for moderate constipation. 03/08/20   Candee Furbish, MD  sodium chloride (OCEAN) 0.65 % SOLN nasal spray Place 1 spray into both nostrils as needed for congestion. 03/08/20   Candee Furbish, MD  sorbitol 70 % SOLN Place 30 mLs into feeding tube daily as needed for moderate constipation. 07/25/20   Candee Furbish, MD  temazepam (RESTORIL) 15 MG capsule Place 1 capsule (15 mg total) into feeding tube at bedtime as needed for sleep. 07/20/20   Horald Pollen, MD  venlafaxine [facility]) 37.5 MG tablet Place 1 tablet (37.5 mg total) into feeding tube 2 (two) times daily with a meal. 07/20/20 10/18/20  Horald Pollen, MD     Critical care time: 52 min    CRITICAL CARE Performed by: Cristal Generous   Total critical care time: 52 minutes  Critical care time was exclusive of separately billable procedures and treating other patients.  Critical care was necessary to treat or prevent imminent or life-threatening deterioration.  Critical care was time spent personally by me on the following activities: development of treatment plan with patient and/or surrogate as well as nursing, discussions with consultants, evaluation of patient's response to treatment, examination of patient, obtaining history from patient or surrogate, ordering and performing treatments and interventions, ordering and review of laboratory studies, ordering and review of radiographic studies, pulse oximetry and re-evaluation of patient's condition.  Eliseo Gum MSN, AGACNP-BC [facility] See amion for pager details 07/30/2020, 3:08 PM

## 2020-07-30 NOTE — ED Notes (Signed)
Attempted report x 2 at this time

## 2020-07-30 NOTE — Progress Notes (Signed)
Pharmacy Antibiotic Note  Kathleen Cameron is a 55 y.o. female admitted on 07/30/2020 with pneumonia.  Pharmacy has been consulted for vancomycin dosing. Also with cefepime ordered x 1 per EDP. SCr 0.71 on admit.  Plan: Cefepime 2g IV x 1 - f/u if to continue Vancomycin 1g IV x 1; then 1g IV q24h. Goal AUC 400-550. Expected AUC: 509 SCr used: 0.8 Monitor clinical progress, c/s, renal function F/u de-escalation plan/LOT, vancomycin levels as indicated      Temp (24hrs), Avg:98.1 F (36.7 C), Min:98.1 F (36.7 C), Max:98.1 F (36.7 C)  Recent Labs  Lab 07/23/20 1457 07/24/20 0127 07/25/20 0201 07/26/20 0255 07/30/20 1100 07/30/20 1110  WBC 11.6* 9.0 9.5 7.4  --  23.3*  CREATININE  --  0.69  --  0.50  --  0.71  LATICACIDVEN  --   --   --   --  1.4  --     Estimated Creatinine Clearance: 60.9 mL/min (by C-G formula based on SCr of 0.71 mg/dL).    Allergies  Allergen Reactions  . Stiolto Respimat [Tiotropium Bromide-Olodaterol] Other (See Comments)    Severe headaches and rapid heartrate    Leia Alf, PharmD, BCPS Please check AMION for all [facility] contact numbers Clinical Pharmacist 07/30/2020 1:29 PM

## 2020-07-30 NOTE — ED Notes (Signed)
Attempted report at this time.

## 2020-07-30 NOTE — Progress Notes (Signed)
eLink Physician-Brief Progress Note Patient Name: Kathleen Cameron DOB: Mar 08, 1966 MRN: [id number]   Date of Service  07/30/2020  HPI/Events of Note  Patient's scheduled Benzodiazepine order was discontinued by Tessie Fass NP, presumably secondary to concerns about respiratory  depression, but the notes do not make it clear, bedside Rn is asking for clarification.  eICU Interventions  Will defer to Ms. Bowser and the daytime crew because I do not have all the facts.        Kathleen Cameron Kathleen Cameron 07/30/2020, 11:17 PM

## 2020-07-30 NOTE — ED Triage Notes (Addendum)
Pt BIB [facility] from home c/o respiratory distress. Pt has a trach. Home health nurse reported pt became SOB at 0830 this AM, had blood in inner lines of airway, pt was suctioned multiple times, had little to no improvement. Initial SpO2 with EMS was 83%, improved to 98%. Pt is alert, lungs diminished bilaterally.   EMS VS 192/100, HR 140, Spo2 98%

## 2020-07-31 ENCOUNTER — Inpatient Hospital Stay: Payer: PRIVATE HEALTH INSURANCE | Admitting: Emergency Medicine

## 2020-07-31 DIAGNOSIS — J9621 Acute and chronic respiratory failure with hypoxia: Principal | ICD-10-CM

## 2020-07-31 LAB — BASIC METABOLIC PANEL
Anion gap: 7 (ref 5–15)
BUN: 19 mg/dL (ref 6–20)
CO2: 32 mmol/L (ref 22–32)
Calcium: 9.6 mg/dL (ref 8.9–10.3)
Chloride: 99 mmol/L (ref 98–111)
Creatinine, Ser: 0.89 mg/dL (ref 0.44–1.00)
GFR, Estimated: >60 mL/min (ref >60)
Glucose, Bld: 79 mg/dL (ref 70–99)
Potassium: 4.1 mmol/L (ref 3.5–5.1)
Sodium: 138 mmol/L (ref 135–145)

## 2020-07-31 LAB — CBC
HCT: 31.9 % — ABNORMAL LOW (ref 36.0–46.0)
Hemoglobin: 9.7 g/dL — ABNORMAL LOW (ref 12.0–15.0)
MCH: 27.4 pg (ref 26.0–34.0)
MCHC: 30.4 g/dL (ref 30.0–36.0)
MCV: 90.1 fL (ref 80.0–100.0)
Platelets: 329 10*3/uL (ref 150–400)
RBC: 3.54 MIL/uL — ABNORMAL LOW (ref 3.87–5.11)
RDW: 15.9 % — ABNORMAL HIGH (ref 11.5–15.5)
WBC: 10 10*3/uL (ref 4.0–10.5)
nRBC: 0 % (ref 0.0–0.2)

## 2020-07-31 LAB — CULTURE, BLOOD (ROUTINE X 2): Special Requests: ADEQUATE

## 2020-07-31 LAB — GLUCOSE, CAPILLARY
Glucose-Capillary: 66 mg/dL — ABNORMAL LOW (ref 70–99)
Glucose-Capillary: 63 mg/dL — ABNORMAL LOW (ref 70–99)
Glucose-Capillary: 74 mg/dL (ref 70–99)

## 2020-07-31 LAB — MAGNESIUM: Magnesium: 1.7 mg/dL (ref 1.7–2.4)

## 2020-07-31 LAB — PHOSPHORUS: Phosphorus: 4.2 mg/dL (ref 2.5–4.6)

## 2020-07-31 MED ORDER — TEMAZEPAM 15 MG PO CAPS
15.0000 mg | ORAL_CAPSULE | Freq: Every evening | ORAL | Status: DC | PRN
  Administered 2020-07-31 – 2020-08-01 (×2): 15 mg via ORAL
  Filled 2020-07-31 (×4): qty 1

## 2020-07-31 MED ORDER — LIDOCAINE 5 % EX PTCH
1.0000 | MEDICATED_PATCH | CUTANEOUS | Status: DC
  Administered 2020-07-31 – 2020-08-03 (×4): 1 via TRANSDERMAL
  Filled 2020-07-31 (×4): qty 1

## 2020-07-31 MED ORDER — ROSUVASTATIN CALCIUM 20 MG PO TABS
40.0000 mg | ORAL_TABLET | Freq: Every day | ORAL | Status: DC
  Administered 2020-08-01 – 2020-08-03 (×3): 40 mg via ORAL
  Filled 2020-07-31 (×3): qty 2

## 2020-07-31 MED ORDER — ADULT MULTIVITAMIN W/MINERALS CH
1.0000 | ORAL_TABLET | Freq: Every day | ORAL | Status: DC
  Administered 2020-07-31 – 2020-08-03 (×4): 1 via ORAL
  Filled 2020-07-31 (×4): qty 1

## 2020-07-31 MED ORDER — PANTOPRAZOLE SODIUM 40 MG PO TBEC
40.0000 mg | DELAYED_RELEASE_TABLET | Freq: Every day | ORAL | Status: DC
  Administered 2020-07-31 – 2020-08-02 (×3): 40 mg via ORAL
  Filled 2020-07-31 (×3): qty 1

## 2020-07-31 MED ORDER — LEVOTHYROXINE SODIUM 50 MCG PO TABS
50.0000 ug | ORAL_TABLET | Freq: Every day | ORAL | Status: DC
  Administered 2020-08-01 – 2020-08-03 (×3): 50 ug via ORAL
  Filled 2020-07-31 (×4): qty 1

## 2020-07-31 MED ORDER — HYDRALAZINE HCL 20 MG/ML IJ SOLN
10.0000 mg | Freq: Four times a day (QID) | INTRAMUSCULAR | Status: DC | PRN
  Administered 2020-07-31: 20 mg via INTRAVENOUS
  Administered 2020-08-02 (×2): 10 mg via INTRAVENOUS
  Filled 2020-07-31 (×2): qty 1

## 2020-07-31 MED ORDER — CLONAZEPAM 0.5 MG PO TBDP: 0.5000 mg | ORAL_TABLET | Freq: Three times a day (TID) | ORAL | Status: DC

## 2020-07-31 MED ORDER — METOCLOPRAMIDE HCL 10 MG PO TABS
10.0000 mg | ORAL_TABLET | Freq: Two times a day (BID) | ORAL | Status: DC
  Administered 2020-07-31 – 2020-08-03 (×7): 10 mg via ORAL
  Filled 2020-07-31 (×8): qty 1

## 2020-07-31 MED ORDER — APIXABAN 5 MG PO TABS
5.0000 mg | ORAL_TABLET | Freq: Two times a day (BID) | ORAL | Status: DC
  Administered 2020-07-31 – 2020-08-03 (×7): 5 mg via ORAL
  Filled 2020-07-31 (×7): qty 1

## 2020-07-31 MED ORDER — VENLAFAXINE HCL 37.5 MG PO TABS
37.5000 mg | ORAL_TABLET | Freq: Two times a day (BID) | ORAL | Status: DC
  Administered 2020-07-31 – 2020-08-03 (×6): 37.5 mg via ORAL
  Filled 2020-07-31 (×7): qty 1

## 2020-07-31 MED ORDER — CLONAZEPAM 0.5 MG PO TBDP
0.5000 mg | ORAL_TABLET | Freq: Three times a day (TID) | ORAL | Status: DC
  Administered 2020-07-31: 0.5 mg
  Filled 2020-07-31: qty 1

## 2020-07-31 MED ORDER — ENSURE ENLIVE PO LIQD
237.0000 mL | Freq: Three times a day (TID) | ORAL | Status: DC
  Administered 2020-07-31 – 2020-08-03 (×8): 237 mL via ORAL

## 2020-07-31 MED ORDER — SENNOSIDES-DOCUSATE SODIUM 8.6-50 MG PO TABS: 2.0000 | ORAL_TABLET | Freq: Every evening | ORAL | Status: DC | PRN

## 2020-07-31 MED ORDER — ENSURE ENLIVE PO LIQD: 237.0000 mL | Freq: Two times a day (BID) | ORAL | Status: DC

## 2020-07-31 MED ORDER — PANTOPRAZOLE SODIUM 40 MG PO PACK: 40.0000 mg | PACK | Freq: Every day | ORAL | Status: DC

## 2020-07-31 MED ORDER — MAGNESIUM SULFATE 2 GM/50ML IV SOLN
2.0000 g | Freq: Once | INTRAVENOUS | Status: AC
  Administered 2020-07-31: 2 g via INTRAVENOUS
  Filled 2020-07-31: qty 50

## 2020-07-31 MED ORDER — CLONAZEPAM 0.5 MG PO TBDP
0.5000 mg | ORAL_TABLET | Freq: Three times a day (TID) | ORAL | Status: DC
  Administered 2020-07-31 – 2020-08-03 (×9): 0.5 mg via ORAL
  Filled 2020-07-31 (×9): qty 1

## 2020-07-31 MED ORDER — CHLORHEXIDINE GLUCONATE CLOTH 2 % EX PADS
6.0000 | MEDICATED_PAD | Freq: Every day | CUTANEOUS | Status: DC
  Administered 2020-07-30 – 2020-08-03 (×5): 6 via TOPICAL

## 2020-07-31 MED ORDER — CLONAZEPAM 0.1 MG/ML ORAL SUSPENSION
0.5000 mg | Freq: Three times a day (TID) | ORAL | Status: DC
  Filled 2020-07-31: qty 5

## 2020-07-31 NOTE — Progress Notes (Signed)
Initial Nutrition Assessment  DOCUMENTATION CODES:   Non-severe (moderate) malnutrition in context of chronic illness  INTERVENTION:   - Please obtain admission weight  - RD will monitor PO intake and assess for need to resume home tube feeding (1 carton Osmolite 1.5 once daily via PEG)  - Ensure Enlive po TID, each supplement provides 350 kcal and 20 grams of protein  - Liberalize diet to Regular, verbal with readback order placed per Dr. [name]  - MVI with minerals daily  NUTRITION DIAGNOSIS:   Moderate Malnutrition related to chronic illness (COPD, trach on chronic vent support) as evidenced by mild fat depletion,mild muscle depletion.  GOAL:   Patient will meet greater than or equal to 90% of their needs  MONITOR:   PO intake,Supplement acceptance,Labs,Weight trends,Skin  REASON FOR ASSESSMENT:   Ventilator    ASSESSMENT:   55 year old female who presented to the ED on 4/11 with respiratory distress. PMH of chronic respiratory failure s/p trach and vent dependence, DVT, seizure disorder, COPD, HTN.   Discussed pt with RN and during ICU rounds. Pt on a Heart Healthy/Carb Modified diet with good PO intake per RN. Discussed liberalizing diet to Regular with MD who agreed. Verbal with readback order placed for Regular diet.  Pt reports that she was eating well at home over the last few days (pt was just discharged back home on 4/07). Pt typically eats 3 meals daily. Pt reports that she was receiving one tube feeding bolus daily at home (1 carton Osmolite 1.5 once daily).  Pt likes chocolate Ensure supplements. RD ordered TID. Will monitor for PO intake and supplement acceptance and assess for need to resume home bolus tube feeding regimen.  Reviewed weight history in chart. Pt with a 10.9 kg weight loss from 07/02/20 to 07/26/20. This is an 18% weight loss in less than 1 month which, if accurate, is severe and significant for timeframe. No admission weight  recorded.  Patient is on chronic ventilator support via trach. MV: 9.2 L/min Temp (24hrs), Avg:98 F (36.7 C), Min:97.6 F (36.4 C), Max:98.8 F (37.1 C)  Meal Completion: 30% x 1 documented meal  Medications reviewed and include: reglan 10 mg BID, protonix, IV abx IVF: LR @ 50 ml/hr  Labs reviewed: hemoglobin 9.7 CBG's: 66-98  I/O's: +1.0 L since admit  NUTRITION - FOCUSED PHYSICAL EXAM:  Flowsheet Row Most Recent Value  Orbital Region Mild depletion  Upper Arm Region No depletion  Thoracic and Lumbar Region Mild depletion  Buccal Region No depletion  Temple Region Mild depletion  Clavicle Bone Region Mild depletion  Clavicle and Acromion Bone Region No depletion  Scapular Bone Region Unable to assess  Dorsal Hand Mild depletion  Patellar Region Mild depletion  Anterior Thigh Region Mild depletion  Posterior Calf Region Mild depletion  Edema (RD Assessment) Mild  Hair Reviewed  Eyes Reviewed  Mouth Reviewed  Skin Reviewed  Nails Reviewed       Diet Order:   Diet Order            Diet regular Room service appropriate? Yes; Fluid consistency: Thin  Diet effective now                 EDUCATION NEEDS:   Education needs have been addressed  Skin:  Skin Assessment: Skin Integrity Issues: Stage I: sacrum Stage II: R buttocks, R ankle  Last BM:  07/28/20  Height:   Ht Readings from Last 1 Encounters:  07/21/20 5\' 5"  (1.651 m)  Weight:   Wt Readings from Last 1 Encounters:  07/26/20 49.7 kg    Estimated Nutritional Needs:   Kcal:  1800-2000  Protein:  85-100 grams  Fluid:  >/= 1.8 L    Mertie Clause, MS, RD, LDN Inpatient Clinical Dietitian Please see AMiON for contact information.

## 2020-07-31 NOTE — TOC Initial Note (Addendum)
Transition of Care [facility]) - Initial/Assessment Note    Patient Details  Name: Kathleen Cameron MRN: [id number] Date of Birth: 13-Sep-1965  Transition of Care [facility]) CM/SW Contact:    Bess Kinds, RN Phone Number: [phone] 07/31/2020, 1:51 PM  Clinical Narrative:                  Spoke with patient and patient's spouse at the bedside. PTA home with DME - trilogy, hospital bed, hoyer lift, bedside commode. Has 112 hrs/week of private duty nursing through [facility] trach/vent management program. Has respiratory therapy through [facility].   Spouse stated that she was readmitted d/t respiratory. Stated that she was having bleeding when suctioning. Spouse stated that he was told to suction every 2 hours even if not needed.   Verified PCP as listed in Epic. Stated usually has virtual visits or will arrange a stretcher transport the day before needed appointment. Discussed possible option of doctors making house calls to consider.   Spoke with Rob at [facility] [phone]) who verified weekly hours. Discussed current hospitalization. There were questions in the note about whether patient was experiencing elevated CO2 vs anxiety since there was mention that her klonopin ran out over the weekend.   Discussed ongoing need for continued education and training of patient's care with spouse stating that patient has only been home with spouse x 3 weeks. Rob to follow up with this need when patient transitions home.  Rob requests that when patient transitions home he will need HH order stating in comments to resume private duty nursing, and DC summary to be faxed to [phone].   Left voicemail for Zeb Comfort ([facility] RT) [phone].   Spouse asked about patient getting PT at home stating that she has not received any since being home. Discussed with Rob at [facility] who see if partnered agency can assist with PT needs.   At discharge patient will need [facility] transport home. UPDATE: [facility]  to be used for transport as long as spouse rides along to manage home vent.   TOC consult for APS referral acknowledged. Lack of information found to support referral at this time.   1700: Spouse had expressed that patient would benefit from PT. Referral to [facility] for [facility] PT declined d/t vent dependent status at home. No other HH agency found d/t limited in network status for patient's payor.   1710: Received call back from Onalee Hua at [facility]. RT was on schedule to follow up with patient on monthly basis d/t patient having private duty nursing with [name]. However, to provide patient and spouse with additional support with managing trach and vent will f/u twice a month.   TOC following for transition needs.   Expected Discharge Plan: Home w Home Health Services Barriers to Discharge: Continued Medical Work up   Patient Goals and CMS Choice Patient states their goals for this hospitalization and ongoing recovery are:: to go home CMS Medicare.gov Compare Post Acute Care list provided to:: Patient Choice offered to / list presented to : [facility]  Expected Discharge Plan and Services Expected Discharge Plan: Home w Home Health Services In-house Referral: Clinical Social Work Discharge Planning Services: CM Consult   Living arrangements for the past 2 months: Apartment                 DME Arranged: N/A DME Agency: NA       HH Arranged: RN HH Agency: [facility] Date [facility] Agency Contacted: 07/31/20 Time HH Agency Contacted:  1350 Representative spoke with at [facility] Agency: Rob  Prior Living Arrangements/Services Living arrangements for the past 2 months: Apartment Lives with:: Self,Spouse Patient language and need for interpreter reviewed:: Yes        Need for Family Participation in Patient Care: Yes (Comment) Care giver support system in place?: Yes (comment) Current home services: DME,Home RN (hospital bed/hoyer/bsc/trilogy) Criminal Activity/Legal  Involvement Pertinent to Current Situation/Hospitalization: No - Comment as needed  Activities of Daily Living      Permission Sought/Granted Permission sought to share information with : Family Supports Permission granted to share information with : Yes, Verbal Permission Granted  Share Information with NAME: Kathleen Cameron     Permission granted to share info w Relationship: spouse  Permission granted to share info w Contact Information: [phone]  Emotional Assessment Appearance:: Appears stated age Attitude/Demeanor/Rapport: Engaged Affect (typically observed): Accepting Orientation: : Oriented to Self,Oriented to  Time,Oriented to Place,Oriented to Situation Alcohol / Substance Use: Not Applicable Psych Involvement: No (comment)  Admission diagnosis:  Respiratory distress [R06.03] Acute on chronic respiratory failure with hypoxia (HCC) [J96.21] Patient Active Problem List   Diagnosis Date Noted  . Aspiration pneumonia (HCC)   . Goals of care, counseling/discussion   . Ileus (HCC)   . Acute on chronic respiratory failure with hypoxia (HCC) 07/21/2020  . Acute on chronic respiratory failure with hypercapnia (HCC) 07/03/2020  . Tracheostomy status (HCC)   . Sepsis (HCC) 06/03/2020  . Healthcare-associated pneumonia 06/03/2020  . Pressure injury of skin 06/03/2020  . Acute respiratory failure (HCC)   . Seizure disorder (HCC)   . COPD, severe (HCC)   . Anxiety disorder   . Generalized abdominal pain   . Convulsions (HCC)   . Malnutrition of moderate degree 02/27/2020  . Hypotension   . Acute hypercapnic respiratory failure (HCC) 02/25/2020  . Right upper quadrant abdominal pain 02/01/2020  . Hypothyroidism (acquired) 02/01/2020  . Acute metabolic encephalopathy 01/29/2020  . Hyponatremia 01/28/2020  . Anxiety 12/02/2019  . COPD exacerbation (HCC) 11/06/2019  . Depression 11/06/2019  . Acute on chronic respiratory failure with hypoxia and hypercapnia (HCC)  11/06/2019  . Current moderate episode of major depressive disorder without prior episode (HCC) 09/20/2019  . Pedal edema 08/04/2019  . Hypokalemia 04/13/2019  . Chronic respiratory failure with hypoxia (HCC) 03/07/2019  . CKD (chronic kidney disease) 01/05/2019  . COPD (chronic obstructive pulmonary disease) (HCC) 09/29/2018  . Pulmonary nodules 09/27/2018  . Dyslipidemia 07/07/2018  . Essential hypertension 03/10/2018   PCP:  [name], MD Pharmacy:   [facility] [phone] - [address] [place] - [address] AT [facility] OF [address] & [address] [place] [address] Phone: [phone] Fax: [phone]  [facility] [place], [place] - [address] [place] [address] Phone: [phone] Fax: [phone]  [facility] [address] [place] [address] Phone: [phone] Fax: [phone]     Social Determinants of Health (SDOH) Interventions    Readmission Risk Interventions Readmission Risk Prevention Plan 02/02/2020  Transportation Screening Complete  PCP or Specialist Appt within 3-5 Days Complete  HRI or Home Care Consult Complete  Social Work Consult for Recovery Care Planning/Counseling Complete  Palliative Care Screening Not Applicable  Medication Review Oceanographer) Complete  Some recent data might be hidden

## 2020-07-31 NOTE — Progress Notes (Signed)
NAME:  Kathleen Cameron, MRN:  [id number], DOB:  [birth year], LOS: 1 ADMISSION DATE:  07/30/2020, CONSULTATION DATE:  07/30/20 REFERRING MD:  Jeraldine Loots - EM, CHIEF COMPLAINT:  Hypoxia   History of Present Illness:  55 yo F well known to PCCM with PMH chronic hypoxic respiratory failure on home trach/vent, DVT, UGIB, Seizure disorder who presents to ED 4/11 with worse hypoxia, SOB, increased respiratory secretions and some bloody secretions from trach. Secretion load has increased at home since last discharge, bloody secretions have been ongoing for a few days. Patient says she "panicked" today which she thinks caused her hypoxia. In ED 4/11, pt started on abx for WBC 23.3 + thick respiratory secretions. CXR looks about the same as most recent admission, possibly some improvement.   Trach/vent poorly managed at home, with frequent complications (Recently admitted 4/2-4/7 after mucus plug causing respiratory arrest and tx for Corynebacterium straitum PNA and ileus, ED 3/25, Admission 3/15-3/21, admission 2/13-3/15) -- pt and family adamantly refuse all destinations except for home, with home health.    Pertinent  Medical History  Chronic hypoxic repiratory failure  VDRF DVT GIB Ileus  Significant Hospital Events: Including procedures, antibiotic start and stop dates in addition to other pertinent events   . 4/11 Admitted after episode of worse hypoxia at home and increased secretion burden.   Interim History / Subjective:   No new issues reported overnight Some anxiety, not currently on her scheduled clonazepam  Hypo Mg >> replaced   Objective   Blood pressure (!) 133/102, pulse 93, temperature 97.6 F (36.4 C), temperature source Axillary, resp. rate 15, SpO2 100 %.    Vent Mode: PRVC FiO2 (%):  [40 %-100 %] 40 % Set Rate:  [16 bmp] 16 bmp Vt Set:  [480 mL] 480 mL PEEP:  [5 cmH20-8 cmH20] 5 cmH20 Plateau Pressure:  [19 cmH20-35 cmH20] 35 cmH20   Intake/Output Summary (Last 24  hours) at 07/31/2020 0753 Last data filed at 07/31/2020 0600 Gross per 24 hour  Intake 1029.14 ml  Output --  Net 1029.14 ml   There were no vitals filed for this visit.  Examination: General: Chronically ill-appearing woman, ventilated via trach HENT: Oropharynx clear, pupils equal, poor dentition Lungs: Clear bilaterally, distant, no wheezing or crackles, no secretions evident and suctioning catheter, no blood Cardiovascular: Regular, distant, no murmur Abdomen: Nondistended, positive bowel sounds, PEG tube Extremities: No significant edema Neuro: Awake, alert, nods to questions, follows commands  Good pinnae grab me upstairs because she is upset because Delorise Shiner wants to transfer the that crazy O 2-week Labs/imaging that I havepersonally reviewed  (right click and "Reselect all SmartList Selections" daily)  4/11 - CBC significant for WBC 23. CMP grossly normal -- albumin low at 3, glucose slightly elevated 162 4/11 CXR - persistent bilateral opacities, might be a little better thas  Previous. Hyperinflated lungs   Resolved Hospital Problem list   Hypotension  Assessment & Plan:   Acute on chronic respiratory failure with Hypoxia VDRF COPD Recent  PNA without obvious recurrence Blood tinged secretions (suspect suction trauma) -comes to ED 4/11 with worse SOB, secretions and hypoxia at home P -Continue necessary pulmonary hygiene and suctioning, suspect that we were over suctioning every 2 hours at home (this is what they were instructed to do) -Tracheal aspirate obtained, culture data pending -Likely restart Eliquis today 4/12 -Suspect at least a component of anxiety.  Husband tells me that they had run out of her clonazepam at home.  I will restart  today at half dose: 0.5 mg 3 times daily  Leukocytosis- 23 on admission 4/11, improved to 10 on 4/12 after stabilization, antibiotics.  At risk infection, no clear source yet -Last admission had corny bacterium straitum P -Begin  to narrow antibiotics: Stop vancomycin, continue cefepime for another day, no organisms seen on respiratory culture -Follow respiratory culture to completion  Recent DVT -Restart Eliquis on 4/12 and follow for any evidence of blood in sputum  Seizure disorder Anxiety P -Restart 3 times daily clonazepam at half usual dose on 4/12  Depression Sleep disorder  P -Continue Effexor, Restoril  GOC -most recent admission, PCM consulted and pt endorsed desire for aggressive tx -frequent readmissions, strained home management of trach vent and adamant refusal of placement in a vent facility P She desperately wants to be at home, not in a facility.  They have worked to try and support her ventilatory needs in the home but she keeps failing, coming back to the hospital.  May need to have further discussions regarding her overall goals of care, transitioning to care at home with plans not to return to the hospital if she fails, palliate if she fails (even if she is ventilated).  We will broach this subject depending on how she recovers, incorporate her wishes, her husband's wishes   Best practice (right click and "Reselect all SmartList Selections" daily)  Diet:  Oral Pain/Anxiety/Delirium protocol (if indicated): No VAP protocol (if indicated): Yes DVT prophylaxis: Systemic AC eliquis GI prophylaxis: PPI Glucose control:  SSI No Central venous access:  N/A Arterial line:  N/A Foley:  N/A Mobility:  OOB  PT consulted: Yes Last date of multidisciplinary goals of care discussion [discussed with patient and husband 4/11] Code Status:  full code Disposition: ICU  Labs   CBC: Recent Labs  Lab 07/25/20 0201 07/26/20 0255 07/30/20 1110 07/31/20 0134  WBC 9.5 7.4 23.3* 10.0  NEUTROABS  --  5.1 19.0*  --   HGB 9.1* 8.7* 12.3 9.7*  HCT 28.2* 27.5* 40.8 31.9*  MCV 87.3 86.8 91.5 90.1  PLT 220 232 544* 329    Basic Metabolic Panel: Recent Labs  Lab 07/25/20 0201 07/26/20 0255  07/30/20 1110 07/31/20 0134  NA  --  137 137 138  K  --  3.8 4.5 4.1  CL  --  100 96* 99  CO2  --  30 33* 32  GLUCOSE  --  111* 162* 79  BUN  --  18 12 19   CREATININE  --  0.50 0.71 0.89  CALCIUM  --  8.9 9.8 9.6  MG 2.0 1.8  --  1.7  PHOS 2.7  --   --  4.2   GFR: Estimated Creatinine Clearance: 54.7 mL/min (by C-G formula based on SCr of 0.89 mg/dL). Recent Labs  Lab 07/25/20 0201 07/26/20 0255 07/30/20 1100 07/30/20 1110 07/31/20 0134  WBC 9.5 7.4  --  23.3* 10.0  LATICACIDVEN  --   --  1.4  --   --     Liver Function Tests: Recent Labs  Lab 07/30/20 1110  AST 35  ALT 17  ALKPHOS 94  BILITOT 0.8  PROT 8.1  ALBUMIN 3.0*   No results for input(s): LIPASE, AMYLASE in the last 168 hours. No results for input(s): AMMONIA in the last 168 hours.  ABG    Component Value Date/Time   PHART 7.386 07/12/2020 2019   PCO2ART 55.8 (H) 07/12/2020 2019   PO2ART 90 07/12/2020 2019   HCO3 33.4 (H)  07/12/2020 2019   TCO2 35 (H) 07/12/2020 2019   O2SAT 96.0 07/12/2020 2019     Coagulation Profile: No results for input(s): INR, PROTIME in the last 168 hours.  Cardiac Enzymes: No results for input(s): CKTOTAL, CKMB, CKMBINDEX, TROPONINI in the last 168 hours.  HbA1C: Hgb A1c MFr Bld  Date/Time Value Ref Range Status  06/21/2020 02:24 AM 5.3 4.8 - 5.6 % Final    Comment:    (NOTE) Pre diabetes:          5.7%-6.4%  Diabetes:              >6.4%  Glycemic control for   <7.0% adults with diabetes   02/26/2020 05:18 PM 5.4 4.8 - 5.6 % Final    Comment:    (NOTE) Pre diabetes:          5.7%-6.4%  Diabetes:              >6.4%  Glycemic control for   <7.0% adults with diabetes     CBG: Recent Labs  Lab 07/26/20 0740 07/26/20 1136 07/30/20 1650 07/30/20 2315 07/31/20 0745  GLUCAP 118* 107* 84 98 66*    Critical care time: 35 min    CRITICAL CARE Performed by: Leslye Peer   Total critical care time: 35 minutes   Critical care time was  exclusive of separately billable procedures and treating other patients.  Critical care was necessary to treat or prevent imminent or life-threatening deterioration.  Critical care was time spent personally by me on the following activities: development of treatment plan with patient and/or surrogate as well as nursing, discussions with consultants, evaluation of patient's response to treatment, examination of patient, obtaining history from patient or surrogate, ordering and performing treatments and interventions, ordering and review of laboratory studies, ordering and review of radiographic studies, pulse oximetry and re-evaluation of patient's condition.   Levy Pupa, MD, PhD 07/31/2020, 8:11 AM [facility] [phone] or if no answer before 7:00PM call [phone] For any issues after 7:00PM please call eLink [phone]

## 2020-07-31 NOTE — Progress Notes (Signed)
Mg 1.7 Replaced per protocol  

## 2020-08-01 ENCOUNTER — Inpatient Hospital Stay (HOSPITAL_COMMUNITY): Payer: Medicaid Other

## 2020-08-01 LAB — BASIC METABOLIC PANEL
Anion gap: 6 (ref 5–15)
BUN: 16 mg/dL (ref 6–20)
CO2: 33 mmol/L — ABNORMAL HIGH (ref 22–32)
Calcium: 9.8 mg/dL (ref 8.9–10.3)
Chloride: 99 mmol/L (ref 98–111)
Creatinine, Ser: 0.66 mg/dL (ref 0.44–1.00)
GFR, Estimated: >60 mL/min (ref >60)
Glucose, Bld: 108 mg/dL — ABNORMAL HIGH (ref 70–99)
Potassium: 3.7 mmol/L (ref 3.5–5.1)
Sodium: 138 mmol/L (ref 135–145)

## 2020-08-01 LAB — GLUCOSE, CAPILLARY
Glucose-Capillary: 94 mg/dL (ref 70–99)
Glucose-Capillary: 96 mg/dL (ref 70–99)
Glucose-Capillary: 80 mg/dL (ref 70–99)

## 2020-08-01 LAB — MAGNESIUM: Magnesium: 1.9 mg/dL (ref 1.7–2.4)

## 2020-08-01 NOTE — Telephone Encounter (Signed)
Spoke to [place] at [facility] concerning order for Osmolite 1.5 to be re faxed. I asked her to resend the order and I will have Dr Alvy Bimler to sign when he return on Monday.

## 2020-08-01 NOTE — Discharge Instructions (Signed)
Information on my medicine - ELIQUIS (apixaban)  This medication education was reviewed with me or my healthcare representative as part of my discharge preparation.   Why was Eliquis prescribed for you? Eliquis was prescribed for you to reduce the risk of forming blood clots that can cause a stroke if you have a medical condition called atrial fibrillation (a type of irregular heartbeat) OR to reduce the risk of a blood clots forming with history of DVT.  What do You need to know about Eliquis ? Take your Eliquis TWICE DAILY - one tablet in the morning and one tablet in the evening with or without food.  It would be best to take the doses about the same time each day.  If you have difficulty swallowing the tablet whole please discuss with your pharmacist how to take the medication safely.  Take Eliquis exactly as prescribed by your doctor and DO NOT stop taking Eliquis without talking to the doctor who prescribed the medication.  Stopping may increase your risk of developing a new clot or stroke.  Refill your prescription before you run out.  After discharge, you should have regular check-up appointments with your healthcare provider that is prescribing your Eliquis.  In the future your dose may need to be changed if your kidney function or weight changes by a significant amount or as you get older.  What do you do if you miss a dose? If you miss a dose, take it as soon as you remember on the same day and resume taking twice daily.  Do not take more than one dose of ELIQUIS at the same time.  Important Safety Information A possible side effect of Eliquis is bleeding. You should call your healthcare provider right away if you experience any of the following: ? Bleeding from an injury or your nose that does not stop. ? Unusual colored urine (red or dark brown) or unusual colored stools (red or black). ? Unusual bruising for unknown reasons. ? A serious fall or if you hit your head (even  if there is no bleeding).  Some medicines may interact with Eliquis and might increase your risk of bleeding or clotting while on Eliquis. To help avoid this, consult your healthcare provider or pharmacist prior to using any new prescription or non-prescription medications, including herbals, vitamins, non-steroidal anti-inflammatory drugs (NSAIDs) and supplements.  This website has more information on Eliquis (apixaban): [email or web address].com.cy.

## 2020-08-01 NOTE — Telephone Encounter (Signed)
Patient missed appointment on 07/31/2020 because she was admitted to the hospital on 07/31/2020. I called her number and left phone message for spouse Earvin Hansen) after checking HIPPA/release of information. Left message in voice mail to call back.

## 2020-08-01 NOTE — Progress Notes (Signed)
RT placed patient on home vent with home vent settings. Patient is tolerating well at this time. No complications. RT will continue to monitor.

## 2020-08-01 NOTE — Progress Notes (Signed)
NAME:  Kathleen Cameron, MRN:  [id number], DOB:  04-Apr-1966, LOS: 2 ADMISSION DATE:  07/30/2020, CONSULTATION DATE:  07/30/20 REFERRING MD:  Jeraldine Loots - EM, CHIEF COMPLAINT:  Hypoxia   History of Present Illness:  55 yo F well known to PCCM with PMH chronic hypoxic respiratory failure on home trach/vent, DVT, UGIB, Seizure disorder who presents to ED 4/11 with worse hypoxia, SOB, increased respiratory secretions and some bloody secretions from trach. Secretion load has increased at home since last discharge, bloody secretions have been ongoing for a few days. Patient says she "panicked" today which she thinks caused her hypoxia. In ED 4/11, pt started on abx for WBC 23.3 + thick respiratory secretions. CXR looks about the same as most recent admission, possibly some improvement.   Trach/vent poorly managed at home, with frequent complications (Recently admitted 4/2-4/7 after mucus plug causing respiratory arrest and tx for Corynebacterium straitum PNA and ileus, ED 3/25, Admission 3/15-3/21, admission 2/13-3/15) -- pt and family adamantly refuse all destinations except for home, with home health.    Pertinent  Medical History  Chronic hypoxic repiratory failure  VDRF DVT GIB Ileus  Significant Hospital Events: Including procedures, antibiotic start and stop dates in addition to other pertinent events   . 4/11 Admitted after episode of worse hypoxia at home and increased secretion burden.   Interim History / Subjective:   No acute distress   Objective   Blood pressure (!) 126/94, pulse 84, temperature 98.1 F (36.7 C), temperature source Oral, resp. rate 16, weight 54.4 kg, SpO2 100 %.    Vent Mode: PRVC FiO2 (%):  [40 %] 40 % Set Rate:  [16 bmp] 16 bmp Vt Set:  [480 mL] 480 mL PEEP:  [5 cmH20] 5 cmH20 Plateau Pressure:  [18 cmH20-22 cmH20] 22 cmH20   Intake/Output Summary (Last 24 hours) at 08/01/2020 0933 Last data filed at 08/01/2020 0522 Gross per 24 hour  Intake 1320.41 ml   Output 2300 ml  Net -979.59 ml   Filed Weights   08/01/20 0500  Weight: 54.4 kg    Examination: General: Awake alert no acute distress HEENT: Tracheostomy in place clear sputum Neuro: Grossly intact without focal defect CV: Heart sounds are regular PULM: Diminished in the bases Vent pressure regulated volume control FIO2 40% PEEP 5 RATE 16 VT 80  GI: soft, bsx4 active   Extremities: warm/dry,  edema  Skin: no rashes or lesions    Labs/imaging that I havepersonally reviewed  (right click and "Reselect all SmartList Selections" daily)    Resolved Hospital Problem list   Hypotension  Assessment & Plan:   Acute on chronic respiratory failure with Hypoxia VDRF COPD Recent  PNA without obvious recurrence Blood tinged secretions (suspect suction trauma) -comes to ED 4/11 with worse SOB, secretions and hypoxia at home P Continue pulmonary toilet Ongoing education with patient and husband Anticoagulation resumed on 07/31/2020 Anxiety being treated with Klonopin Ongoing education to patient and husband concerning vent management      Leukocytosis- 23 on admission 4/11, improved to 10 on 4/12 after stabilization, antibiotics.  At risk infection, no clear source yet -Last admission had corny bacterium straitum  Plan: Continue antimicrobial therapy currently on cefepime. Follow respiratory cultures  Recent DVT Eliquis restarted on 07/31/2020  Seizure disorder Anxiety P Restarted clonazepam at half dose on 07/31/2020  Depression Sleep disorder  P Continue Effexor and Restoril  GOC most recent admission, PCM consulted and pt endorsed desire for aggressive tx -frequent readmissions, strained home management  of trach vent and adamant refusal of placement in a vent facility P Ongoing discussions with patient and husband concerning their ability to care for her at home health settings.  Despite multiple admissions and difficulty in the home health setting she  will most likely be discharged back to home with maximum home health interventions. Best practice (right click and "Reselect all SmartList Selections" daily)  Diet:  Oral Pain/Anxiety/Delirium protocol (if indicated): No VAP protocol (if indicated): Yes DVT prophylaxis: Systemic AC eliquis GI prophylaxis: PPI Glucose control:  SSI No Central venous access:  N/A Arterial line:  N/A Foley:  N/A Mobility:  OOB  PT consulted: Yes Last date of multidisciplinary goals of care discussion [discussed with patient and husband 4/11] Code Status:  full code Disposition: ICU  Labs   CBC: Recent Labs  Lab 07/26/20 0255 07/30/20 1110 07/31/20 0134  WBC 7.4 23.3* 10.0  NEUTROABS 5.1 19.0*  --   HGB 8.7* 12.3 9.7*  HCT 27.5* 40.8 31.9*  MCV 86.8 91.5 90.1  PLT 232 544* 329    Basic Metabolic Panel: Recent Labs  Lab 07/26/20 0255 07/30/20 1110 07/31/20 0134 08/01/20 0147  NA 137 137 138 138  K 3.8 4.5 4.1 3.7  CL 100 96* 99 99  CO2 30 33* 32 33*  GLUCOSE 111* 162* 79 108*  BUN 18 12 19 16   CREATININE 0.50 0.71 0.89 0.66  CALCIUM 8.9 9.8 9.6 9.8  MG 1.8  --  1.7 1.9  PHOS  --   --  4.2  --    GFR: Estimated Creatinine Clearance: 66.6 mL/min (by C-G formula based on SCr of 0.66 mg/dL). Recent Labs  Lab 07/26/20 0255 07/30/20 1100 07/30/20 1110 07/31/20 0134  WBC 7.4  --  23.3* 10.0  LATICACIDVEN  --  1.4  --   --     Liver Function Tests: Recent Labs  Lab 07/30/20 1110  AST 35  ALT 17  ALKPHOS 94  BILITOT 0.8  PROT 8.1  ALBUMIN 3.0*   No results for input(s): LIPASE, AMYLASE in the last 168 hours. No results for input(s): AMMONIA in the last 168 hours.  ABG    Component Value Date/Time   PHART 7.386 07/12/2020 2019   PCO2ART 55.8 (H) 07/12/2020 2019   PO2ART 90 07/12/2020 2019   HCO3 33.4 (H) 07/12/2020 2019   TCO2 35 (H) 07/12/2020 2019   O2SAT 96.0 07/12/2020 2019     Coagulation Profile: No results for input(s): INR, PROTIME in the last 168  hours.  Cardiac Enzymes: No results for input(s): CKTOTAL, CKMB, CKMBINDEX, TROPONINI in the last 168 hours.  HbA1C: Hgb A1c MFr Bld  Date/Time Value Ref Range Status  06/21/2020 02:24 AM 5.3 4.8 - 5.6 % Final    Comment:    (NOTE) Pre diabetes:          5.7%-6.4%  Diabetes:              >6.4%  Glycemic control for   <7.0% adults with diabetes   02/26/2020 05:18 PM 5.4 4.8 - 5.6 % Final    Comment:    (NOTE) Pre diabetes:          5.7%-6.4%  Diabetes:              >6.4%  Glycemic control for   <7.0% adults with diabetes     CBG: Recent Labs  Lab 07/30/20 2315 07/31/20 0745 07/31/20 1122 07/31/20 1544 08/01/20 0752  GLUCAP 98 66* 63* 74 94  Critical care time: 35 min    CRITICAL CARE   Total critical care time: 35 minutes   Brett Canales Soffia Doshier ACNP Acute Care Nurse Practitioner [facility] Please consult Amion 08/01/2020, 9:33 AM

## 2020-08-02 ENCOUNTER — Other Ambulatory Visit (HOSPITAL_COMMUNITY): Payer: Self-pay

## 2020-08-02 LAB — BASIC METABOLIC PANEL
Anion gap: 8 (ref 5–15)
BUN: 19 mg/dL (ref 6–20)
CO2: 30 mmol/L (ref 22–32)
Calcium: 9.9 mg/dL (ref 8.9–10.3)
Chloride: 98 mmol/L (ref 98–111)
Creatinine, Ser: 0.77 mg/dL (ref 0.44–1.00)
GFR, Estimated: >60 mL/min (ref >60)
Glucose, Bld: 107 mg/dL — ABNORMAL HIGH (ref 70–99)
Potassium: 4.2 mmol/L (ref 3.5–5.1)
Sodium: 136 mmol/L (ref 135–145)

## 2020-08-02 LAB — CBC WITH DIFFERENTIAL/PLATELET
Abs Immature Granulocytes: 0.09 10*3/uL — ABNORMAL HIGH (ref 0.00–0.07)
Basophils Absolute: 0.1 10*3/uL (ref 0.0–0.1)
Basophils Relative: 0 %
Eosinophils Absolute: 0.3 10*3/uL (ref 0.0–0.5)
Eosinophils Relative: 3 %
HCT: 26 % — ABNORMAL LOW (ref 36.0–46.0)
Hemoglobin: 8.3 g/dL — ABNORMAL LOW (ref 12.0–15.0)
Immature Granulocytes: 1 %
Lymphocytes Relative: 19 %
Lymphs Abs: 2.1 10*3/uL (ref 0.7–4.0)
MCH: 27.5 pg (ref 26.0–34.0)
MCHC: 31.9 g/dL (ref 30.0–36.0)
MCV: 86.1 fL (ref 80.0–100.0)
Monocytes Absolute: 0.6 10*3/uL (ref 0.1–1.0)
Monocytes Relative: 6 %
Neutro Abs: 8.1 10*3/uL — ABNORMAL HIGH (ref 1.7–7.7)
Neutrophils Relative %: 71 %
Platelets: UNDETERMINED 10*3/uL (ref 150–400)
RBC: 3.02 MIL/uL — ABNORMAL LOW (ref 3.87–5.11)
RDW: 15.9 % — ABNORMAL HIGH (ref 11.5–15.5)
WBC: 11.3 10*3/uL — ABNORMAL HIGH (ref 4.0–10.5)
nRBC: 0 % (ref 0.0–0.2)

## 2020-08-02 LAB — CULTURE, RESPIRATORY W GRAM STAIN
Culture: NORMAL
Gram Stain: NONE SEEN

## 2020-08-02 LAB — MAGNESIUM: Magnesium: 1.7 mg/dL (ref 1.7–2.4)

## 2020-08-02 LAB — PHOSPHORUS: Phosphorus: 1.8 mg/dL — ABNORMAL LOW (ref 2.5–4.6)

## 2020-08-02 MED ORDER — POTASSIUM & SODIUM PHOSPHATES 280-160-250 MG PO PACK
1.0000 | PACK | Freq: Three times a day (TID) | ORAL | Status: AC
  Administered 2020-08-02 (×3): 1 via ORAL
  Filled 2020-08-02 (×3): qty 1

## 2020-08-02 MED ORDER — ALPRAZOLAM 0.5 MG PO TABS
0.5000 mg | ORAL_TABLET | Freq: Once | ORAL | Status: AC
  Administered 2020-08-02: 0.5 mg via ORAL
  Filled 2020-08-02: qty 1

## 2020-08-02 MED ORDER — HYDRALAZINE HCL 20 MG/ML IJ SOLN: 10.0000 mg | INTRAMUSCULAR | Status: DC | PRN

## 2020-08-02 MED ORDER — CLONAZEPAM 1 MG PO TABS
0.5000 mg | ORAL_TABLET | Freq: Three times a day (TID) | ORAL | 0 refills | Status: DC | PRN
  Filled 2020-08-02: qty 45, 30d supply, fill #0

## 2020-08-02 MED ORDER — MAGNESIUM SULFATE 2 GM/50ML IV SOLN
2.0000 g | Freq: Once | INTRAVENOUS | Status: AC
  Administered 2020-08-02: 2 g via INTRAVENOUS
  Filled 2020-08-02: qty 50

## 2020-08-02 MED ORDER — AMOXICILLIN-POT CLAVULANATE 250-62.5 MG/5ML PO SUSR
500.0000 mg | Freq: Two times a day (BID) | ORAL | 0 refills | Status: AC
  Filled 2020-08-02: qty 100, 4d supply, fill #0

## 2020-08-02 NOTE — TOC Transition Note (Addendum)
Transition of Care [facility]) - CM/SW Discharge Note   Patient Details  Name: Kathleen Cameron MRN: [id number] Date of Birth: 02/29/1966  Transition of Care [facility]) CM/SW Contact:  Bess Kinds, RN Phone Number: [phone] 08/02/2020, 1:58 PM   Clinical Narrative:     Spoke with nursing - patient ready to transition home. Spoke with spouse to discuss plan for him to ride along on PTAR to manage the home vent. Home address verified. Spoke with [name] at [phone] to confirm private duty nurse scheduled for tonight. PTAR arranged for 4pm. Medical transport paperwork to be completed and put on chart. Nursing aware. No further TOC needs identified.   UPDATE: DC Summary and HH order for resumption of private duty nursing services faxed to [phone]. TOC delivering DC meds to bedside - no copays.   Final next level of care: Home w Home Health Services Barriers to Discharge: No Barriers Identified   Patient Goals and CMS Choice Patient states their goals for this hospitalization and ongoing recovery are:: return home with spouse CMS Medicare.gov Compare Post Acute Care list provided to:: Patient Choice offered to / list presented to : [facility]  Discharge Placement                       Discharge Plan and Services In-house Referral: Clinical Social Work Discharge Planning Services: CM Consult            DME Arranged: N/A DME Agency: NA       HH Arranged: RN HH Agency: [name] Date [facility] Agency Contacted: 08/02/20 Time HH Agency Contacted: [id number] Representative spoke with at [facility] Agency: Rob  Social Determinants of Health (SDOH) Interventions     Readmission Risk Interventions Readmission Risk Prevention Plan 02/02/2020  Transportation Screening Complete  PCP or Specialist Appt within 3-5 Days Complete  HRI or Home Care Consult Complete  Social Work Consult for Recovery Care Planning/Counseling Complete  Palliative Care Screening Not Applicable   Medication Review [employer]) Complete  Some recent data might be hidden

## 2020-08-02 NOTE — Progress Notes (Signed)
Afternoon events:   5:00 pm Patients blood pressure is 157/94, HR 77. BP cuff readjusted and placed on a different extremity, readings remain consistently elevated. PRN dose of hydralazine 10mg  IV is given for "high blood pressure" per [facility] instructions.  Will continue to monitor.   5:30 pm Patients blood pressure is 201/101, HR 99.  Patient is appearing more restless and slightly anxious. Comfort measures and reassurance provided by RN and patients husband.  Additional dose of PRN hydralazine 10mg  IV given to patient. Will continue to closely monitor.   6:00pm Patients blood pressure is 207/99, HR 118.  Patient states that she is having a "panic attack" in her words.  She has previously received her scheduled doses of klonapin and effexor.  She denies any need for suctioning at this time and sats are 95%. She states she dosent know why she's feeling this anxiety all of a sudden.  RN Called [facility] to make him aware of these recent events, new orders received for Xanex 0.5mg  PO X1 dose. Will continue to closely monitor.

## 2020-08-02 NOTE — Discharge Summary (Signed)
Physician Discharge Summary       Patient ID: Kathleen Cameron MRN: [id number] DOB/AGE: 1965/05/15 55 y.o.  Admit date: 07/30/2020 Discharge date: 08/02/2020  Discharge Diagnoses:  Active Problems:   Acute on chronic respiratory failure with hypoxia (HCC)   History of Present Illness:   55 yo F well known to PCCM with PMH chronic hypoxic respiratory failure on home trach/vent, DVT, UGIB, Seizure disorder who presented to ED 4/11 with worse hypoxia, SOB, increased respiratory secretions and some bloody secretions from trach.  Trach/vent poorly managed at home, with frequent complications (Recently admitted 4/2-4/7 after mucus plug causing respiratory arrest and tx for Corynebacterium straitum PNA and ileus, ED 3/25, Admission 3/15-3/21, admission 2/13-3/15) -- pt and family adamantly refuse all destinations except for home, with home health.    Secretion load has increased at home since discharge, bloody secretions have been ongoing for a few days after frequent suctioning q 2 hours. Patient attributes this episode of hypoxia to panic/anxiety. In ED 4/11, pt started on abx for WBC 23.3 + thick respiratory secretions.    Hospital Course:  She was admitted to [facility] for management of vent dependent respiratory failure and hemoptysis due to ballard suction related airway trauma. She was treated for pneumonia with cefepime in the setting of increased secretions and leukocytosis, however, imaging was negative. Cultures have remained negative. Anticoagulation was initially held due to hemoptysis, which improved, allowing AC was restarted on 4/12. She became stable on our vent and had essentially returned to her baseline so she wasplaced back on her home vent 4/13. On 4/14 she was again deemed a candidate for discharge. Once again it has been recommended that she pursue placement in a vent SNF as there have been repeated issues with vent management in the home setting, but patient and family are  steadfast in their desire to take her home.    Discharge Plan by active problems   Acute on chronic respiratory failure with hypoxia: She is on a home ventilator and has had multiple recent admissions for complications related to the home vent.  This time she presented with an episode of hypoxia apparently proceeded by anxiety and panic.  Also her tracheostomy has been suctioned every 2 hours which is supposedly what the patient and husband were told upon previous discharge.  This resulted in some tracheal trauma and hemoptysis.  She also had leukocytosis and increased secretions upon presentation raising concern for pneumonia.  This was not supported by imaging. P Continue home vent at prior to admission settings Complete a 5-day course of antibiotics will transition cefepime to Augmentin Cultures negative to date Continue Eliquis for DVT Anxiety managed with clonazepam.  Dosing changed to 0.5 mg 3 times a day. Ongoing education to patient and husband concerning vent management Will ensure that the home health agency is expecting her prior to discharge. Discharge to vent SNF has been recommended. Family and patient are aware that an emergency at home could result in death much more easily than in a skilled nursing setting.   COPD without acute exacerbation - continue Brovana, budesonide, yupelri  Depression - Continue Effexor and Restoril  Hypophosphatemia - replaced as an inpatient - outpatient lab follow-up   Significant Hospital tests/ studies  Consults   Discharge Exam: BP (!) 142/99   Pulse 94   Temp 97.8 F (36.6 C) (Oral)   Resp 18   Wt 55 kg   SpO2 99%   BMI 20.18 kg/m   General:  Thin middle aged  female in NAD Neuro:  Alert, oriented, non-focal HEENT:  [place]/AT, No JVD noted, PERRL. Trach in place. Poor dentition.  Cardiovascular:  RRR, no MRG Lungs:  Clear bilateral breath sounds.  Abdomen:  Soft, non-distended, non-focal Musculoskeletal:  No acute deformity or  ROM limitation Skin:  Intact, MMM    Labs at discharge Lab Results  Component Value Date   CREATININE 0.77 08/02/2020   BUN 19 08/02/2020   NA 136 08/02/2020   K 4.2 08/02/2020   CL 98 08/02/2020   CO2 30 08/02/2020   Lab Results  Component Value Date   WBC 11.3 (H) 08/02/2020   HGB 8.3 (L) 08/02/2020   HCT 26.0 (L) 08/02/2020   MCV 86.1 08/02/2020   PLT PLATELET CLUMPS NOTED ON SMEAR, UNABLE TO ESTIMATE 08/02/2020   Lab Results  Component Value Date   ALT 17 07/30/2020   AST 35 07/30/2020   ALKPHOS 94 07/30/2020   BILITOT 0.8 07/30/2020   Lab Results  Component Value Date   INR 1.6 (H) 07/21/2020   INR 1.2 03/23/2020   INR 0.98 09/18/2013    Current radiology studies DG Chest Port 1 View  Result Date: 08/01/2020 CLINICAL DATA:  Respiratory distress with tracheostomy in place EXAM: PORTABLE CHEST 1 VIEW COMPARISON:  July 30, 2020 FINDINGS: Tracheostomy catheter tip is 7.2 cm above the carina. No pneumothorax. Lungs are hyperexpanded. Small focus of ill-defined opacity in right base remains. There is been interval clearing of ill-defined opacity from the left mid lung and left base regions. Heart size and pulmonary vascularity are normal. No adenopathy. No bone lesions. IMPRESSION: Tracheostomy as described without pneumothorax. Lungs mildly hyperexpanded. Suspect persistent small focus of pneumonia right base. Areas of airspace opacity noted previously on the left cleared. No new opacity evident. Stable cardiac silhouette. Electronically Signed   By: Lowella Grip III M.D.   On: 08/01/2020 11:19    Disposition:   Discharge disposition: 01-Home or Self Care       Discharge Instructions    Diet - low sodium heart healthy   Complete by: As directed    Increase activity slowly   Complete by: As directed    No wound care   Complete by: As directed      Allergies as of 08/02/2020      Reactions   Stiolto Respimat [tiotropium Bromide-olodaterol] Other (See  Comments)   Severe headaches and rapid heartrate      Medication List    TAKE these medications   amoxicillin-clavulanate 250-62.5 MG/5ML suspension Commonly known as: AUGMENTIN Place 10 mLs (500 mg total) into feeding tube 2 (two) times daily for 4 days.   apixaban 5 MG Tabs tablet Commonly known as: ELIQUIS Place 1 tablet (5 mg total) into feeding tube 2 (two) times daily.   arformoterol 15 MCG/2ML Nebu Commonly known as: BROVANA Take 2 mLs (15 mcg total) by nebulization 2 (two) times daily.   bisacodyl 10 MG suppository Commonly known as: DULCOLAX Place 1 suppository (10 mg total) rectally daily as needed for severe constipation.   budesonide 0.25 MG/2ML nebulizer solution Commonly known as: PULMICORT Take 2 mLs (0.25 mg total) by nebulization 2 (two) times daily.   chlorhexidine gluconate (MEDLINE KIT) 0.12 % solution Commonly known as: PERIDEX 15 mLs by Mouth Rinse route 2 (two) times daily.   Chlorhexidine Gluconate Cloth 2 % Pads Apply 6 each topically daily.   clonazePAM 1 MG tablet Commonly known as: KLONOPIN Place 0.5 tablets (0.5 mg total) into feeding  tube 3 (three) times daily as needed for anxiety. What changed:   how much to take  when to take this  reasons to take this   Ensure Active High Protein Liqd Take 1 Can by mouth in the morning and at bedtime.   feeding supplement (OSMOLITE 1.2 CAL) Liqd Place 1,000 mLs into feeding tube continuous.   ipratropium-albuterol 0.5-2.5 (3) MG/3ML Soln Commonly known as: DUONEB Take 3 mLs by nebulization every 4 (four) hours.   levothyroxine 50 MCG tablet Commonly known as: SYNTHROID Take 1 tablet (50 mcg total) by mouth daily at 6 (six) AM.   lip balm ointment Apply topically as needed for lip care.   metoCLOPramide 10 MG/10ML Soln Commonly known as: REGLAN Place 10 mLs (10 mg total) into feeding tube in the morning and at bedtime.   multivitamin with minerals Tabs tablet Place 1 tablet into  feeding tube daily.   oxyCODONE-acetaminophen 5-325 MG tablet Commonly known as: PERCOCET/ROXICET PLACE 1 TABLET INTO FEEDING TUBE EVERY EIGHT HOURS AS NEEDED FOR UP TO 3 DAYS FOR SEVERE PAIN.   pantoprazole sodium 40 mg/20 mL Pack Commonly known as: PROTONIX Place 20 mLs (40 mg total) into feeding tube at bedtime.   phenol 1.4 % Liqd Commonly known as: CHLORASEPTIC Use as directed 1 spray in the mouth or throat as needed for throat irritation / pain.   polyethylene glycol 17 g packet Commonly known as: MIRALAX / GLYCOLAX Place 17 g into feeding tube daily as needed for mild constipation.   revefenacin 175 MCG/3ML nebulizer solution Commonly known as: YUPELRI Take 3 mLs (175 mcg total) by nebulization daily.   rosuvastatin 40 MG tablet Commonly known as: CRESTOR Place 1 tablet (40 mg total) into feeding tube daily.   senna-docusate 8.6-50 MG tablet Commonly known as: Senokot-S Place 2 tablets into feeding tube at bedtime as needed for moderate constipation.   sodium chloride 0.65 % Soln nasal spray Commonly known as: OCEAN Place 1 spray into both nostrils as needed for congestion.   sorbitol 70 % Soln Place 30 mLs into feeding tube daily as needed for moderate constipation.   temazepam 15 MG capsule Commonly known as: RESTORIL Place 1 capsule (15 mg total) into feeding tube at bedtime as needed for sleep.   venlafaxine 37.5 MG tablet Commonly known as: EFFEXOR Place 1 tablet (37.5 mg total) into feeding tube 2 (two) times daily with a meal.       Discharged Condition: stable  43 minutes of time have been dedicated to discharge assessment, planning and discharge instructions.   Signed:  Georgann Housekeeper, AGACNP-BC [facility]  See Amion for personal pager PCCM on call pager [phone] until 7pm. Please call Elink 7p-7a. [phone]  08/02/2020 2:25 PM

## 2020-08-02 NOTE — Progress Notes (Signed)
eLink Physician-Brief Progress Note Patient Name: [name] DOB: 02/11/1966 MRN: [id number]   Date of Service  08/02/2020  HPI/Events of Note  Hypertension.  eICU Interventions  PRN Hydralazine dosing interval shortened to Q 4 hours.        Kathleen Cameron 08/02/2020, 10:05 PM

## 2020-08-02 NOTE — Progress Notes (Signed)
[facility] ADULT ICU REPLACEMENT PROTOCOL   The patient does apply for the [facility] Adult ICU Electrolyte Replacment Protocol based on the criteria listed below:   1. Is GFR >/= 30 ml/min? Yes.    Patient's GFR today is >60 2. Is SCr </= 2? Yes.   Patient's SCr is 0.77 ml/kg/hr 3. Did SCr increase >/= 0.5 in 24 hours? No. 4. Abnormal electrolyte(s):  Phos 1.8, Mg 1.7 5. Ordered repletion with: protocol 6. If a panic level lab has been reported, has the CCM MD in charge been notified? Yes.  .   Physician:  S. Bobbye Morton R Marili Vader 08/02/2020 5:24 AM

## 2020-08-02 NOTE — Progress Notes (Signed)
1645: Patient states that she is still very anxious and not comfortable going home tonight, requesting that PTAR be canceled tonight. RN called Dr. [name] to make him aware of patients request. He states this is fine and to cancel PTAR, CCM will reassess patients readiness for discharge tomorrow. PTAR called and made aware to cancel the transfer home tonight. Will continue to closely monitor.

## 2020-08-03 LAB — BASIC METABOLIC PANEL
Anion gap: 5 (ref 5–15)
BUN: 18 mg/dL (ref 6–20)
CO2: 30 mmol/L (ref 22–32)
Calcium: 9.7 mg/dL (ref 8.9–10.3)
Chloride: 100 mmol/L (ref 98–111)
Creatinine, Ser: 0.68 mg/dL (ref 0.44–1.00)
GFR, Estimated: >60 mL/min (ref >60)
Glucose, Bld: 94 mg/dL (ref 70–99)
Potassium: 3.9 mmol/L (ref 3.5–5.1)
Sodium: 135 mmol/L (ref 135–145)

## 2020-08-03 LAB — PHOSPHORUS: Phosphorus: 2.6 mg/dL (ref 2.5–4.6)

## 2020-08-03 LAB — MAGNESIUM: Magnesium: 2 mg/dL (ref 1.7–2.4)

## 2020-08-03 NOTE — Discharge Summary (Signed)
Physician Discharge Summary       Patient ID: Kathleen Cameron MRN: [id number] DOB/AGE: [birth year] 55 y.o.  Admit date: 07/30/2020 Discharge date: 08/03/2020  Discharge Diagnoses:  Active Problems:   Acute on chronic respiratory failure with hypoxia (HCC)   History of Present Illness:   55 yo F well known to PCCM with PMH chronic hypoxic respiratory failure on home trach/vent, DVT, UGIB, Seizure disorder who presented to ED 4/11 with worse hypoxia, SOB, increased respiratory secretions and some bloody secretions from trach.  Trach/vent poorly managed at home, with frequent complications (Recently admitted 4/2-4/7 after mucus plug causing respiratory arrest and tx for Corynebacterium straitum PNA and ileus, ED 3/25, Admission 3/15-3/21, admission 2/13-3/15) -- pt and family adamantly refuse all destinations except for home, with home health.    Secretion load has increased at home since discharge, bloody secretions have been ongoing for a few days after frequent suctioning q 2 hours. Patient attributes this episode of hypoxia to panic/anxiety. In ED 4/11, pt started on abx for WBC 23.3 + thick respiratory secretions.   Hospital Course:  She was admitted to [facility] for management of vent dependent respiratory failure and hemoptysis due to ballard suction related airway trauma. She was treated for pneumonia with cefepime in the setting of increased secretions and leukocytosis, however, imaging was negative. Cultures have remained negative. Anticoagulation was initially held due to hemoptysis, which improved, allowing AC was restarted on 4/12. She became stable on our vent and had essentially returned to her baseline so she wasplaced back on her home vent 4/13. On 4/14 she was again deemed a candidate for discharge. Once again it has been recommended that she pursue placement in a vent SNF as there have been repeated issues with vent management in the home setting, but patient and family are  steadfast in their desire to take her home.    Discharge Plan by active problems   Acute on chronic respiratory failure with hypoxia:  She is on a home ventilator and has had multiple recent admissions for complications related to the home vent.  This time she presented with an episode of hypoxia apparently proceeded by anxiety and panic.  Also her tracheostomy has been suctioned every 2 hours which is supposedly what the patient and husband were told upon previous discharge.  This resulted in some tracheal trauma and hemoptysis.  She also had leukocytosis and increased secretions upon presentation raising concern for pneumonia.  This was not supported by imaging. P: Continue ventilator setting at home regiment  Continue PO Augmentin, cultures remain NTD Continue Eliquis for DVT Reduced clonazepam at discharge  Recommended SNF at discharge but family and patient insistent on home discharge  Family and patient are aware that an emergency at home could result in death much more easily than in a skilled nursing setting.   COPD without acute exacerbation P: Continue home Brovana, Budesonide, Yupelri  Depression P: Continue Effexor and Restoril   Hypophosphatemia P: Supplemented during admission Follow up outpatient    Significant Hospital tests/studies  Consults   Discharge Exam: BP (!) 135/101   Pulse 84   Temp 97.8 F (36.6 C) (Oral)   Resp 16   Wt 58.5 kg   SpO2 100%   BMI 21.46 kg/m   General:  Thin middle aged female in NAD Neuro:  Alert, oriented, non-focal HEENT:  [place]/AT, No JVD noted, PERRL. Trach in place. Poor dentition.  Cardiovascular:  RRR, no MRG Lungs:  Clear bilateral breath sounds.  Abdomen:  Soft, non-distended, non-focal Musculoskeletal:  No acute deformity or ROM limitation Skin:  Intact, MMM    Labs at discharge Lab Results  Component Value Date   CREATININE 0.68 08/03/2020   BUN 18 08/03/2020   NA 135 08/03/2020   K 3.9 08/03/2020   CL 100  08/03/2020   CO2 30 08/03/2020   Lab Results  Component Value Date   WBC 11.3 (H) 08/02/2020   HGB 8.3 (L) 08/02/2020   HCT 26.0 (L) 08/02/2020   MCV 86.1 08/02/2020   PLT PLATELET CLUMPS NOTED ON SMEAR, UNABLE TO ESTIMATE 08/02/2020   Lab Results  Component Value Date   ALT 17 07/30/2020   AST 35 07/30/2020   ALKPHOS 94 07/30/2020   BILITOT 0.8 07/30/2020   Lab Results  Component Value Date   INR 1.6 (H) 07/21/2020   INR 1.2 03/23/2020   INR 0.98 09/18/2013    Current radiology studies DG Chest Port 1 View  Result Date: 08/01/2020 CLINICAL DATA:  Respiratory distress with tracheostomy in place EXAM: PORTABLE CHEST 1 VIEW COMPARISON:  July 30, 2020 FINDINGS: Tracheostomy catheter tip is 7.2 cm above the carina. No pneumothorax. Lungs are hyperexpanded. Small focus of ill-defined opacity in right base remains. There is been interval clearing of ill-defined opacity from the left mid lung and left base regions. Heart size and pulmonary vascularity are normal. No adenopathy. No bone lesions. IMPRESSION: Tracheostomy as described without pneumothorax. Lungs mildly hyperexpanded. Suspect persistent small focus of pneumonia right base. Areas of airspace opacity noted previously on the left cleared. No new opacity evident. Stable cardiac silhouette. Electronically Signed   By: Lowella Grip III M.D.   On: 08/01/2020 11:19    Disposition:   Discharge disposition: 01-Home or Self Care       Discharge Instructions    Diet - low sodium heart healthy   Complete by: As directed    Increase activity slowly   Complete by: As directed    No wound care   Complete by: As directed      Allergies as of 08/03/2020      Reactions   Stiolto Respimat [tiotropium Bromide-olodaterol] Other (See Comments)   Severe headaches and rapid heartrate      Medication List    TAKE these medications   amoxicillin-clavulanate 250-62.5 MG/5ML suspension Commonly known as: AUGMENTIN Place 10  mLs (500 mg total) into feeding tube 2 (two) times daily for 4 days.  **Discard Remainder**   apixaban 5 MG Tabs tablet Commonly known as: ELIQUIS Place 1 tablet (5 mg total) into feeding tube 2 (two) times daily.   arformoterol 15 MCG/2ML Nebu Commonly known as: BROVANA Take 2 mLs (15 mcg total) by nebulization 2 (two) times daily.   bisacodyl 10 MG suppository Commonly known as: DULCOLAX Place 1 suppository (10 mg total) rectally daily as needed for severe constipation.   budesonide 0.25 MG/2ML nebulizer solution Commonly known as: PULMICORT Take 2 mLs (0.25 mg total) by nebulization 2 (two) times daily.   chlorhexidine gluconate (MEDLINE KIT) 0.12 % solution Commonly known as: PERIDEX 15 mLs by Mouth Rinse route 2 (two) times daily.   Chlorhexidine Gluconate Cloth 2 % Pads Apply 6 each topically daily.   clonazePAM 1 MG tablet Commonly known as: KLONOPIN Place 1/2 tablet (0.5 mg total) into feeding tube 3 (three) times daily as needed for anxiety. What changed:   how much to take  when to take this  reasons to take this   Ensure Active High  Protein Liqd Take 1 Can by mouth in the morning and at bedtime.   feeding supplement (OSMOLITE 1.2 CAL) Liqd Place 1,000 mLs into feeding tube continuous.   ipratropium-albuterol 0.5-2.5 (3) MG/3ML Soln Commonly known as: DUONEB Take 3 mLs by nebulization every 4 (four) hours.   levothyroxine 50 MCG tablet Commonly known as: SYNTHROID Take 1 tablet (50 mcg total) by mouth daily at 6 (six) AM.   lip balm ointment Apply topically as needed for lip care.   metoCLOPramide 10 MG/10ML Soln Commonly known as: REGLAN Place 10 mLs (10 mg total) into feeding tube in the morning and at bedtime.   multivitamin with minerals Tabs tablet Place 1 tablet into feeding tube daily.   oxyCODONE-acetaminophen 5-325 MG tablet Commonly known as: PERCOCET/ROXICET PLACE 1 TABLET INTO FEEDING TUBE EVERY EIGHT HOURS AS NEEDED FOR UP TO 3 DAYS  FOR SEVERE PAIN.   pantoprazole sodium 40 mg/20 mL Pack Commonly known as: PROTONIX Place 20 mLs (40 mg total) into feeding tube at bedtime.   phenol 1.4 % Liqd Commonly known as: CHLORASEPTIC Use as directed 1 spray in the mouth or throat as needed for throat irritation / pain.   polyethylene glycol 17 g packet Commonly known as: MIRALAX / GLYCOLAX Place 17 g into feeding tube daily as needed for mild constipation.   revefenacin 175 MCG/3ML nebulizer solution Commonly known as: YUPELRI Take 3 mLs (175 mcg total) by nebulization daily.   rosuvastatin 40 MG tablet Commonly known as: CRESTOR Place 1 tablet (40 mg total) into feeding tube daily.   senna-docusate 8.6-50 MG tablet Commonly known as: Senokot-S Place 2 tablets into feeding tube at bedtime as needed for moderate constipation.   sodium chloride 0.65 % Soln nasal spray Commonly known as: OCEAN Place 1 spray into both nostrils as needed for congestion.   sorbitol 70 % Soln Place 30 mLs into feeding tube daily as needed for moderate constipation.   temazepam 15 MG capsule Commonly known as: RESTORIL Place 1 capsule (15 mg total) into feeding tube at bedtime as needed for sleep.   venlafaxine 37.5 MG tablet Commonly known as: EFFEXOR Place 1 tablet (37.5 mg total) into feeding tube 2 (two) times daily with a meal.       Discharged Condition: stable  36 minutes of time have been dedicated to discharge assessment, planning and discharge instructions.   Signed:  Johnsie Cancel, NP-C [facility] Personal contact information can be found on Amion  08/03/2020, 10:06 AM

## 2020-08-04 LAB — CULTURE, BLOOD (SINGLE)
Culture: NO GROWTH
Special Requests: ADEQUATE

## 2020-08-07 ENCOUNTER — Telehealth: Payer: Self-pay | Admitting: Emergency Medicine

## 2020-08-07 DIAGNOSIS — J9611 Chronic respiratory failure with hypoxia: Secondary | ICD-10-CM

## 2020-08-07 NOTE — Telephone Encounter (Signed)
Kathleen Cameron from [place] has called requesting a lap tray prescription through [facility]   [phone]

## 2020-08-08 ENCOUNTER — Telehealth: Payer: Self-pay | Admitting: *Deleted

## 2020-08-08 NOTE — Telephone Encounter (Signed)
Faxed signed orders to [facility] Attn: [facility]. Confirmation results success.

## 2020-08-10 ENCOUNTER — Telehealth: Payer: Self-pay | Admitting: *Deleted

## 2020-08-10 NOTE — Telephone Encounter (Signed)
Spoke to [employer] therapist) from [facility] concerning the orders faxed to her. She confirmed she received all the signed orders received for the patient. Also, since the recent discharge from the hospital Duo neb has been added. The patient states she does not want to use the other respiratory meds because it causes her be dry. I advised Meriam Sprague I will let Dr Alvy Bimler know what is going on with the patient. She will need a telephone visit.

## 2020-08-13 ENCOUNTER — Inpatient Hospital Stay (HOSPITAL_COMMUNITY): Admit: 2020-08-13 | Payer: PRIVATE HEALTH INSURANCE

## 2020-08-14 ENCOUNTER — Telehealth: Payer: Self-pay | Admitting: Emergency Medicine

## 2020-08-14 ENCOUNTER — Other Ambulatory Visit (HOSPITAL_COMMUNITY): Payer: Self-pay

## 2020-08-14 DIAGNOSIS — K219 Gastro-esophageal reflux disease without esophagitis: Secondary | ICD-10-CM

## 2020-08-14 DIAGNOSIS — E039 Hypothyroidism, unspecified: Secondary | ICD-10-CM

## 2020-08-14 NOTE — Telephone Encounter (Signed)
Patient requesting refill for  pantoprazole sodium (PROTONIX) 40 mg/20 mL PACK levothyroxine (SYNTHROID) 50 MCG tablet apixaban (ELIQUIS) 5 MG TABS tablet    Pharmacy [facility] [phone] - [place], [place] - [address] AT [facility] OF [address]

## 2020-08-15 ENCOUNTER — Ambulatory Visit (HOSPITAL_COMMUNITY)
Admission: RE | Admit: 2020-08-15 | Discharge: 2020-08-15 | Disposition: A | Discharge status: Home / Self Care | Payer: Medicaid Other | Source: Ambulatory Visit | Attending: Acute Care | Admitting: Acute Care

## 2020-08-15 ENCOUNTER — Other Ambulatory Visit: Payer: Self-pay | Admitting: *Deleted

## 2020-08-15 ENCOUNTER — Other Ambulatory Visit: Payer: Self-pay

## 2020-08-15 DIAGNOSIS — J9611 Chronic respiratory failure with hypoxia: Secondary | ICD-10-CM | POA: Diagnosis not present

## 2020-08-15 DIAGNOSIS — Z4689 Encounter for fitting and adjustment of other specified devices: Secondary | ICD-10-CM | POA: Diagnosis present

## 2020-08-15 DIAGNOSIS — J449 Chronic obstructive pulmonary disease, unspecified: Secondary | ICD-10-CM | POA: Diagnosis not present

## 2020-08-15 DIAGNOSIS — Z430 Encounter for attention to tracheostomy: Secondary | ICD-10-CM | POA: Insufficient documentation

## 2020-08-15 DIAGNOSIS — J9612 Chronic respiratory failure with hypercapnia: Secondary | ICD-10-CM | POA: Diagnosis not present

## 2020-08-15 DIAGNOSIS — Z930 Tracheostomy status: Secondary | ICD-10-CM | POA: Diagnosis not present

## 2020-08-15 DIAGNOSIS — Z9911 Dependence on respirator [ventilator] status: Secondary | ICD-10-CM | POA: Insufficient documentation

## 2020-08-15 NOTE — Progress Notes (Addendum)
Tracheostomy Procedure Note  Marquis Diles [id number] 04-Mar-1966  Pre Procedure Tracheostomy Information  Trach Brand: Shiley Size: 6.0 Flex [id number] Style: Cuffed Secured by: Velcro Covid swab negative done with home health nursing Irene Shipper Dick,RN on 06/17/2020  Lot #[id number]  [id number] control #  Procedure: Trach change and trach cleaning   Post Procedure Tracheostomy Information  Trach Brand: Shiley Size: 6.0 flex [id number] Style: Cuffed Secured by: Velcro Cuffed checked on new trach prior to insertion  Post Procedure Evaluation:  ETCO2 positive color change from yellow to purple : Yes.   Vital signs:VSS Patients current condition: stable Complications: No apparent complications Trach site exam: clean and dry Wound care done: 4 x 4 gauze Patient did tolerate procedure well.   Education: Home Health nurse educated and orders verified  Prescription needs: Done by NP [name],    Additional needs: none

## 2020-08-15 NOTE — Progress Notes (Signed)
Reason for visit   Planned tracheostomy change  HPI  This is a 55 year old female patient well-known to our service with a diagnosis of chronic respiratory failure and ventilator dependence just left the hospital about 2 weeks prior for ventilator associated pneumonia.  She is home on the ventilator.  Her diagnoses keeping her on mechanical ventilation include severe underlying chronic obstructive pulmonary disease, recurrent pneumonia and mucous plugging, and severe deconditioning.  She presents today for planned tracheostomy change.  She is accompanied by her home health nurse, she has been doing well on home ventilation requiring baseline 2 L oxygen has had no new symptoms including no chest congestion, worsening cough, worsening shortness of breath, had transient increased need of oxygen up to 3 L but since resolving spontaneously.  Requires suctioning 2-3 times during the day, quality of sputum has been unchanged and clear  Review of systems General no fever chills, has generalized weakness but slowly improving HEENT no headache nasal congestion sore throat tracheostomy site pain Pulmonary: No new cough, no change in sputum production, baseline exertional dyspnea, with fatigue being more significant factor.  No chest pain.  No wheezing.  Does still get relief from [facility] Cardiac: No chest pain or palpitations, no edema no orthopnea Musculoskeletal: No joint pain or tenderness Neuro no change of mental status, no focal weakness GI no nausea vomiting or diarrhea GU deferred Endocrine no hot or cold intolerance no change in weight  Physical exam  General 55 year old debilitated female who remains on home mechanical ventilator HEENT normocephalic atraumatic no jugular venous distention appreciated size 6 flexible cuffed tracheostomy in place she is able to phonate with cuff partially deflated Pulmonary: Clear to auscultation diminished bases no wheezing or rhonchi Cardiac: Regular rate and  rhythm Abdomen: Soft nontender PEG tube is unremarkable Neuro intact Extremities warm dry  Procedure  The size 6 cuffed tracheostomy was removed, site inspected and was found to be unremarkable.  A new size 6 cuffed tracheostomy was inserted without difficulty end-tidal CO2 confirmation confirmed patient tolerated well  Impression/plan  Chronic obstructive pulmonary disease Chronic hypoxic and hypercarbic respiratory failure Ventilator dependence Tracheostomy dependence Severe physical deconditioning  Discussion Patient seems to be doing well with mechanical ventilator, oxygen requirements have been stable.  I have no new recommendations from a pulmonary standpoint she has been refusing her budesonide and Yupelri she is already getting every 4 hours DuoNeb and feels well controlled with this, I remove this from her medication regimen for now  Plan Continue mechanical ventilation Continue routine tracheostomy care Return office visit 8 weeks for planned trach change Continue scheduled bronchodilators Follow-up with pulmonary  My time 20 minutes more than 3/4 of this for direct face to face   Simonne Martinet ACNP-BC [facility] Pager # [phone] OR # [phone] if no answer

## 2020-08-16 ENCOUNTER — Other Ambulatory Visit (HOSPITAL_COMMUNITY): Payer: Self-pay

## 2020-08-16 ENCOUNTER — Other Ambulatory Visit: Payer: Self-pay | Admitting: *Deleted

## 2020-08-16 ENCOUNTER — Telehealth: Payer: Self-pay | Admitting: Emergency Medicine

## 2020-08-16 DIAGNOSIS — K219 Gastro-esophageal reflux disease without esophagitis: Secondary | ICD-10-CM

## 2020-08-16 DIAGNOSIS — E039 Hypothyroidism, unspecified: Secondary | ICD-10-CM

## 2020-08-16 NOTE — Telephone Encounter (Signed)
levothyroxine (SYNTHROID) 50 MCG tablet [facility] [phone] - [place], [place] - [address] AT [address] [facility] OF [address] & [address] Phone:  [phone]  Fax:  [phone]     Last seen- 10.05.21 Next apt- n/a

## 2020-08-17 MED ORDER — PANTOPRAZOLE SODIUM 40 MG PO PACK: 40.0000 mg | PACK | Freq: Every day | ORAL | Status: DC

## 2020-08-17 MED ORDER — LEVOTHYROXINE SODIUM 50 MCG PO TABS: 50.0000 ug | ORAL_TABLET | Freq: Every day | ORAL | 3 refills | Status: DC

## 2020-08-17 NOTE — Telephone Encounter (Signed)
Spoke to [place] at [place] on [address] to check it refills on levothyroxine and pantoprazole received. Per Tobi Bastos levothyroxine picked up, but not pantoprazole.

## 2020-08-17 NOTE — Addendum Note (Signed)
Addended by: Georg Ruddle A on: 08/17/2020 12:23 PM   Modules accepted: Orders

## 2020-08-18 NOTE — Telephone Encounter (Signed)
Not sure how to order this.  Thanks.  Please ask around in the office.

## 2020-08-20 ENCOUNTER — Other Ambulatory Visit: Payer: Self-pay | Admitting: Emergency Medicine

## 2020-08-20 ENCOUNTER — Telehealth: Payer: Self-pay | Admitting: Emergency Medicine

## 2020-08-20 ENCOUNTER — Other Ambulatory Visit (HOSPITAL_COMMUNITY): Payer: Self-pay

## 2020-08-20 MED ORDER — APIXABAN 5 MG PO TABS: 5.0000 mg | ORAL_TABLET | Freq: Two times a day (BID) | ORAL | 0 refills | Status: DC

## 2020-08-20 NOTE — Telephone Encounter (Signed)
Needs to be on Eliquis.  New prescription sent.  Thanks.

## 2020-08-20 NOTE — Addendum Note (Signed)
Addended by: Evie Lacks on: 08/20/2020 03:01 PM   Modules accepted: Orders

## 2020-08-20 NOTE — Telephone Encounter (Signed)
Called patient's spouse Earvin Hansen) to let him know Dr Alvy Bimler sent Eliquis to pharmacy. The patient needs to be on the medication. Also, I spoke to Etheleen Mayhew because he requested a lap tray for her wheelchair to let him know I will fax DME for lap tray.  I faxed it to [employer] [facility]. The confirmation result OK.

## 2020-08-20 NOTE — Telephone Encounter (Signed)
Patient's husband called the [facility] in regards to the medication apixaban (ELIQUIS) 5 MG TABS tablet.   Medication was d/c on 07/21/2020 by a provider from the ED but per patient she thinks she needs to continue the medication still. If she does need to resume the medication she will need a refill sent to her preferred pharmacy.   Please advise and call her husband back at [phone].   Preferred Pharmacy: [facility] [phone] - [place], [place] - [address] AT [facility] OF [address] & [facility] [address] [facility] [address]  Phone:  [phone]  Fax:  [phone]

## 2020-08-20 NOTE — Addendum Note (Signed)
Addended by: Georg Ruddle A on: 08/20/2020 02:57 PM   Modules accepted: Orders

## 2020-08-21 ENCOUNTER — Encounter: Payer: Self-pay | Admitting: Emergency Medicine

## 2020-08-21 ENCOUNTER — Telehealth (INDEPENDENT_AMBULATORY_CARE_PROVIDER_SITE_OTHER): Payer: Medicaid Other | Admitting: Emergency Medicine

## 2020-08-21 DIAGNOSIS — J9611 Chronic respiratory failure with hypoxia: Secondary | ICD-10-CM | POA: Diagnosis not present

## 2020-08-21 DIAGNOSIS — K219 Gastro-esophageal reflux disease without esophagitis: Secondary | ICD-10-CM

## 2020-08-21 DIAGNOSIS — Z930 Tracheostomy status: Secondary | ICD-10-CM | POA: Diagnosis not present

## 2020-08-21 DIAGNOSIS — E039 Hypothyroidism, unspecified: Secondary | ICD-10-CM | POA: Diagnosis not present

## 2020-08-21 DIAGNOSIS — J449 Chronic obstructive pulmonary disease, unspecified: Secondary | ICD-10-CM | POA: Diagnosis not present

## 2020-08-21 MED ORDER — CHLORHEXIDINE GLUCONATE CLOTH 2 % EX PADS: 6.0000 | MEDICATED_PAD | Freq: Every day | CUTANEOUS | 5 refills | Status: AC

## 2020-08-21 NOTE — Progress Notes (Signed)
Telemedicine Encounter- SOAP NOTE Established Patient MyChart video encounter Patient: Home  Provider: Office   Patient and husband present  This video encounter was conducted with the patient's (or proxy's) verbal consent via video telecommunications: yes/no: Yes Patient was instructed to have this encounter in a suitably private space; and to only have persons present to whom they give permission to participate. In addition, patient identity was confirmed by use of name plus two identifiers (DOB and address).  I discussed the limitations, risks, security and privacy concerns of performing an evaluation and management service by telephone and the availability of in person appointments. I also discussed with the patient that there may be a patient responsible charge related to this service. The patient expressed understanding and agreed to proceed.  I spent a total of TIME; 0 MIN TO 60 MIN: 20 minutes talking with the patient or their proxy.  Chief complaint: Follow-up multiple chronic medical problems  Subjective   Kathleen Cameron is a 55 y.o. female established patient. Telephone visit today for follow-up of chronic respiratory failure secondary to COPD and multiple complications including pneumonia, sepsis, need for tracheostomy, electrolyte imbalances, pulmonary embolism.  HPI   Patient Active Problem List   Diagnosis Date Noted  . Aspiration pneumonia ([place])   . Goals of care, counseling/discussion   . Ileus ([place])   . Acute on chronic respiratory failure with hypoxia ([place]) 07/21/2020  . Acute on chronic respiratory failure with hypercapnia ([place]) 07/03/2020  . Tracheostomy status ([place])   . Sepsis ([place]) 06/03/2020  . Healthcare-associated pneumonia 06/03/2020  . Pressure injury of skin 06/03/2020  . Acute respiratory failure ([place])   . Seizure disorder ([place])   . COPD, severe ([place])   . Anxiety disorder   . Generalized abdominal pain   . Convulsions ([place])   .  Malnutrition of moderate degree 02/27/2020  . Hypotension   . Acute hypercapnic respiratory failure ([place]) 02/25/2020  . Right upper quadrant abdominal pain 02/01/2020  . Hypothyroidism (acquired) 02/01/2020  . Acute metabolic encephalopathy 78/58/8502  . Hyponatremia 01/28/2020  . Anxiety 12/02/2019  . COPD exacerbation ([place]) 11/06/2019  . Depression 11/06/2019  . Acute on chronic respiratory failure with hypoxia and hypercapnia ([place]) 11/06/2019  . Current moderate episode of major depressive disorder without prior episode ([place]) 09/20/2019  . Pedal edema 08/04/2019  . Hypokalemia 04/13/2019  . Chronic respiratory failure with hypoxia ([place]) 03/07/2019  . CKD (chronic kidney disease) 01/05/2019  . COPD (chronic obstructive pulmonary disease) ([place]) 09/29/2018  . Pulmonary nodules 09/27/2018  . Dyslipidemia 07/07/2018  . Essential hypertension 03/10/2018    Past Medical History:  Diagnosis Date  . Acute on chronic respiratory failure with hypoxia ([place])   . Anxiety disorder   . Asthma   . COPD (chronic obstructive pulmonary disease) ([place])   . COPD, severe ([place])   . Hypertension   . Seizure disorder [facility])     Current Outpatient Medications  Medication Sig Dispense Refill  . apixaban (ELIQUIS) 5 MG TABS tablet Place 1 tablet (5 mg total) into feeding tube 2 (two) times daily. 240 tablet 0  . arformoterol (BROVANA) 15 MCG/2ML NEBU Take 2 mLs (15 mcg total) by nebulization 2 (two) times daily. (Patient not taking: Reported on 07/31/2020) 120 mL 3  . bisacodyl (DULCOLAX) 10 MG suppository Place 1 suppository (10 mg total) rectally daily as needed for severe constipation. 12 suppository 0  . budesonide (PULMICORT) 0.25 MG/2ML nebulizer solution Take 2 mLs (0.25 mg total) by nebulization 2 (  two) times daily. 60 mL 12  . Chlorhexidine Gluconate Cloth 2 % PADS Apply 6 each topically daily. 6 each 5  . chlorhexidine gluconate, MEDLINE KIT, (PERIDEX) 0.12 % solution 15 mLs by Mouth Rinse route  2 (two) times daily. 120 mL 2  . clonazePAM (KLONOPIN) 1 MG tablet Place 1/2 tablet (0.5 mg total) into feeding tube 3 (three) times daily as needed for anxiety. 90 tablet 0  . ipratropium-albuterol (DUONEB) 0.5-2.5 (3) MG/3ML SOLN Take 3 mLs by nebulization every 4 (four) hours.    [name] levothyroxine (SYNTHROID) 50 MCG tablet Take 1 tablet (50 mcg total) by mouth daily at 6 (six) AM. 90 tablet 3  . lip balm (CARMEX) ointment Apply topically as needed for lip care. 7 g 0  . metoCLOPramide (REGLAN) 5 MG/5ML solution Place 10 mLs (10 mg total) into feeding tube in the morning and at bedtime. 473 mL 1  . Multiple Vitamin (MULTIVITAMIN WITH MINERALS) TABS tablet Place 1 tablet into feeding tube daily. 90 tablet 0  . Nutritional Supplements (ENSURE ACTIVE HIGH PROTEIN) LIQD Take 1 Can by mouth in the morning and at bedtime. (Patient not taking: Reported on 07/31/2020)    . Nutritional Supplements (FEEDING SUPPLEMENT, OSMOLITE 1.2 CAL,) LIQD Place 1,000 mLs into feeding tube continuous.  0  . oxyCODONE-acetaminophen (PERCOCET/ROXICET) 5-325 MG tablet PLACE 1 TABLET INTO FEEDING TUBE EVERY EIGHT HOURS AS NEEDED FOR UP TO 3 DAYS FOR SEVERE PAIN. 9 tablet 0  . pantoprazole sodium (PROTONIX) 40 mg/20 mL PACK Place 20 mLs (40 mg total) into feeding tube at bedtime. 30 mL   . phenol (CHLORASEPTIC) 1.4 % LIQD Use as directed 1 spray in the mouth or throat as needed for throat irritation / pain.  0  . polyethylene glycol (MIRALAX / GLYCOLAX) 17 g packet Place 17 g into feeding tube daily as needed for mild constipation. (Patient not taking: Reported on 07/31/2020) 14 each 0  . revefenacin (YUPELRI) 175 MCG/3ML nebulizer solution Take 3 mLs (175 mcg total) by nebulization daily. (Patient not taking: Reported on 07/31/2020) 90 mL 3  . rosuvastatin (CRESTOR) 40 MG tablet Place 1 tablet (40 mg total) into feeding tube daily.    [name] senna-docusate (SENOKOT-S) 8.6-50 MG tablet Place 2 tablets into feeding tube at bedtime as  needed for moderate constipation.    . sodium chloride (OCEAN) 0.65 % SOLN nasal spray Place 1 spray into both nostrils as needed for congestion.  0  . sorbitol 70 % SOLN Place 30 mLs into feeding tube daily as needed for moderate constipation. 473 mL 2  . temazepam (RESTORIL) 15 MG capsule Place 1 capsule (15 mg total) into feeding tube at bedtime as needed for sleep. 30 capsule 1  . venlafaxine (EFFEXOR) 37.5 MG tablet Place 1 tablet (37.5 mg total) into feeding tube 2 (two) times daily with a meal. 180 tablet 1   No current facility-administered medications for this visit.    Allergies  Allergen Reactions  . Stiolto Respimat [Tiotropium Bromide-Olodaterol] Other (See Comments)    Severe headaches and rapid heartrate    Social History   Socioeconomic History  . Marital status: Married    Spouse name: Not on file  . Number of children: Not on file  . Years of education: Not on file  . Highest education level: Not on file  Occupational History  . Not on file  Tobacco Use  . Smoking status: Never Smoker  . Smokeless tobacco: Never Used  Vaping Use  .  Vaping Use: Never used  Substance and Sexual Activity  . Alcohol use: Yes    Alcohol/week: 2.0 standard drinks    Types: 2 Cans of beer per week  . Drug use: Never  . Sexual activity: Not on file  Other Topics Concern  . Not on file  Social History Narrative  . Not on file   Social Determinants of Health   Financial Resource Strain: Not on file  Food Insecurity: No Food Insecurity  . Worried About Charity fundraiser in the Last Year: Never true  . Ran Out of Food in the Last Year: Never true  Transportation Needs: No Transportation Needs  . Lack of Transportation (Medical): No  . Lack of Transportation (Non-Medical): No  Physical Activity: Not on file  Stress: Not on file  Social Connections: Not on file  Intimate Partner Violence: Not on file    Review of Systems  Constitutional: Negative.  Negative for chills  and fever.  HENT: Negative.  Negative for congestion and sore throat.   Respiratory: Negative for cough, hemoptysis and shortness of breath.   Cardiovascular: Negative.  Negative for chest pain and leg swelling.  Gastrointestinal: Negative for abdominal pain, nausea and vomiting.  Genitourinary: Positive for frequency.  Skin: Negative.  Negative for rash.  Neurological: Negative for dizziness and headaches.  All other systems reviewed and are negative.   Objective  Awake and alert and oriented x3 in no apparent respiratory distress Vitals as reported by the patient: There were no vitals filed for this visit.  There are no diagnoses linked to this encounter.  Diagnoses and all orders for this visit:  Chronic respiratory failure with hypoxia (HCC)  Tracheostomy status (HCC)  COPD, severe ([place])  Hypothyroidism (acquired)  Gastroesophageal reflux disease, unspecified whether esophagitis present  Other orders -     Chlorhexidine Gluconate Cloth 2 % PADS; Apply 6 each topically daily.     Clinically stable.  Getting home health nursing care 3-4 times weekly.  No problems with tracheostomy site.  No problems with feeding tube.  Patient gets around the house in a wheelchair without problems.  Compliant with medications.  Eating and drinking adequately. No problems sleeping.  Comfortable at home. I discussed the assessment and treatment plan with the patient. The patient was provided an opportunity to ask questions and all were answered. The patient agreed with the plan and demonstrated an understanding of the instructions.   The patient was advised to call back or seek an in-person evaluation if the symptoms worsen or if the condition fails to improve as anticipated.  I provided 20 minutes of non-face-to-face time during this encounter.  Horald Pollen, MD  Primary Care at [facility]

## 2020-08-23 ENCOUNTER — Encounter: Payer: Self-pay | Admitting: Emergency Medicine

## 2020-08-23 NOTE — Telephone Encounter (Signed)
Kathleen Cameron a home health aide with [facility] calling, states the patients bp was 160/100 and she has no BP medication so she is wondering what needs to be done. Also wondering if a prescription for albuterol needs to be sent in because when she picked it up from the pharmacy they only gave a ten day supply Also the patient is completely out of pantoprazole sodium (PROTONIX) 40 mg/20 mL PACK Also requesting the pads listed above for the patient.  Kathleen [phone]

## 2020-08-24 ENCOUNTER — Other Ambulatory Visit: Payer: Self-pay

## 2020-08-24 ENCOUNTER — Other Ambulatory Visit: Payer: Self-pay | Admitting: Emergency Medicine

## 2020-08-24 DIAGNOSIS — I10 Essential (primary) hypertension: Secondary | ICD-10-CM

## 2020-08-24 DIAGNOSIS — K219 Gastro-esophageal reflux disease without esophagitis: Secondary | ICD-10-CM

## 2020-08-24 MED ORDER — AMLODIPINE BESYLATE 5 MG PO TABS: 5.0000 mg | ORAL_TABLET | Freq: Every day | ORAL | 3 refills | Status: DC

## 2020-08-24 MED ORDER — PANTOPRAZOLE SODIUM 40 MG PO PACK: 40.0000 mg | PACK | Freq: Every day | ORAL | Status: DC

## 2020-08-24 NOTE — Telephone Encounter (Signed)
Tinnie Gens from [facility] called back   Checking on the status of the refill. I notified him that it was sent in to her pharmacy today. He also expressed concern for her BP being elevated the past readings. Last readings have ranged from 150/80 to 160/90. Recommending adding a BP medication for to improve BP.    Please advise.   Best contact # [phone]

## 2020-08-24 NOTE — Telephone Encounter (Signed)
Called and spoke with Iva ( home health aid) and medication was refilled.

## 2020-08-24 NOTE — Telephone Encounter (Signed)
Called and spoke with Iva ( home health aid) regarding pt request for a prescription for elevated BP, at prescription for albuterol and pantoprazole. She states that a prescription for Eliquis was received for BP, also she will get in contact with patient's pulmonary provider for a refill of albuterol. Refilled prantoprazole 08/24/20.

## 2020-08-24 NOTE — Telephone Encounter (Signed)
Blood pressure medication sent to pharmacy of record. Will need paper prescription faxed to pharmacy for the pads requested. Thanks.

## 2020-08-27 ENCOUNTER — Telehealth: Payer: Self-pay

## 2020-08-27 ENCOUNTER — Encounter: Payer: Self-pay | Admitting: Emergency Medicine

## 2020-08-27 MED ORDER — CLONAZEPAM 1 MG PO TABS: 0.5000 mg | ORAL_TABLET | Freq: Three times a day (TID) | ORAL | 0 refills | Status: DC | PRN

## 2020-08-27 NOTE — Telephone Encounter (Signed)
Sent prescription to provider to approve.

## 2020-08-27 NOTE — Telephone Encounter (Signed)
Follow up message   Spouse calling for status of refill, advised to allow more time

## 2020-08-27 NOTE — Telephone Encounter (Signed)
Thanks

## 2020-08-27 NOTE — Telephone Encounter (Signed)
Called to verify if pt had received the BP medication amlodipine that was requested. Pt husband states that he will check with the pharmacy. Pt also put in a request for a refill of klonopin, that I sent to you for approval. Pt husband also states that the pads that were sent in were not the correct type, he says that [facility] will verify the type of pads needed.

## 2020-08-27 NOTE — Telephone Encounter (Signed)
Patient is requesting a refill of the following medications: Requested Prescriptions   Pending Prescriptions Disp Refills  . clonazePAM (KLONOPIN) 1 MG tablet 90 tablet 0    Sig: Place 1/2 tablet (0.5 mg total) into feeding tube 3 (three) times daily as needed for anxiety.    Date of patient request: 08/27/20  Last office visit:  Date of last refill: 08/02/20  Last refill amount: 90 Follow up time period per chart: nla

## 2020-08-28 NOTE — Telephone Encounter (Signed)
Patients husband called and said that the patient cannot get the medication until the 14th. His pharmacy told him to try another pharmacy to see if they would give it too her sooner. He said that she is out of medication. It can be sent to [facility] [address] [place], [place] [address]. Please advise

## 2020-08-28 NOTE — Telephone Encounter (Signed)
Patients HH nurse states the pharmacy cant fill it until the 14th because its too early but the patient only has one left

## 2020-08-29 ENCOUNTER — Other Ambulatory Visit: Payer: Self-pay | Admitting: Emergency Medicine

## 2020-08-29 ENCOUNTER — Inpatient Hospital Stay (HOSPITAL_COMMUNITY)
Admission: EM | Admit: 2020-08-29 | Discharge: 2020-09-18 | DRG: 853 | Disposition: A | Discharge status: Home Health | Payer: Medicaid Other | Attending: Student | Admitting: Student

## 2020-08-29 ENCOUNTER — Telehealth: Payer: Self-pay | Admitting: *Deleted

## 2020-08-29 ENCOUNTER — Inpatient Hospital Stay (HOSPITAL_COMMUNITY): Payer: Medicaid Other

## 2020-08-29 ENCOUNTER — Emergency Department (HOSPITAL_COMMUNITY): Payer: Medicaid Other

## 2020-08-29 ENCOUNTER — Telehealth: Payer: Self-pay | Admitting: Emergency Medicine

## 2020-08-29 ENCOUNTER — Other Ambulatory Visit: Payer: Self-pay

## 2020-08-29 DIAGNOSIS — J439 Emphysema, unspecified: Secondary | ICD-10-CM | POA: Diagnosis present

## 2020-08-29 DIAGNOSIS — E871 Hypo-osmolality and hyponatremia: Secondary | ICD-10-CM | POA: Diagnosis present

## 2020-08-29 DIAGNOSIS — E876 Hypokalemia: Secondary | ICD-10-CM | POA: Diagnosis not present

## 2020-08-29 DIAGNOSIS — R1084 Generalized abdominal pain: Secondary | ICD-10-CM | POA: Diagnosis present

## 2020-08-29 DIAGNOSIS — R0603 Acute respiratory distress: Secondary | ICD-10-CM | POA: Diagnosis present

## 2020-08-29 DIAGNOSIS — N179 Acute kidney failure, unspecified: Secondary | ICD-10-CM | POA: Diagnosis not present

## 2020-08-29 DIAGNOSIS — Z8701 Personal history of pneumonia (recurrent): Secondary | ICD-10-CM

## 2020-08-29 DIAGNOSIS — F410 Panic disorder [episodic paroxysmal anxiety] without agoraphobia: Secondary | ICD-10-CM | POA: Diagnosis present

## 2020-08-29 DIAGNOSIS — Z888 Allergy status to other drugs, medicaments and biological substances status: Secondary | ICD-10-CM | POA: Diagnosis not present

## 2020-08-29 DIAGNOSIS — Z20822 Contact with and (suspected) exposure to covid-19: Secondary | ICD-10-CM | POA: Diagnosis present

## 2020-08-29 DIAGNOSIS — I82B12 Acute embolism and thrombosis of left subclavian vein: Secondary | ICD-10-CM | POA: Diagnosis present

## 2020-08-29 DIAGNOSIS — Z79899 Other long term (current) drug therapy: Secondary | ICD-10-CM | POA: Diagnosis not present

## 2020-08-29 DIAGNOSIS — N189 Chronic kidney disease, unspecified: Secondary | ICD-10-CM | POA: Diagnosis present

## 2020-08-29 DIAGNOSIS — R0989 Other specified symptoms and signs involving the circulatory and respiratory systems: Secondary | ICD-10-CM | POA: Diagnosis not present

## 2020-08-29 DIAGNOSIS — I10 Essential (primary) hypertension: Secondary | ICD-10-CM | POA: Diagnosis not present

## 2020-08-29 DIAGNOSIS — J440 Chronic obstructive pulmonary disease with acute lower respiratory infection: Secondary | ICD-10-CM | POA: Diagnosis not present

## 2020-08-29 DIAGNOSIS — E039 Hypothyroidism, unspecified: Secondary | ICD-10-CM | POA: Diagnosis present

## 2020-08-29 DIAGNOSIS — R140 Abdominal distension (gaseous): Secondary | ICD-10-CM

## 2020-08-29 DIAGNOSIS — D509 Iron deficiency anemia, unspecified: Secondary | ICD-10-CM | POA: Diagnosis present

## 2020-08-29 DIAGNOSIS — K3184 Gastroparesis: Secondary | ICD-10-CM | POA: Diagnosis not present

## 2020-08-29 DIAGNOSIS — J9621 Acute and chronic respiratory failure with hypoxia: Secondary | ICD-10-CM | POA: Diagnosis present

## 2020-08-29 DIAGNOSIS — K567 Ileus, unspecified: Secondary | ICD-10-CM | POA: Diagnosis not present

## 2020-08-29 DIAGNOSIS — R0600 Dyspnea, unspecified: Secondary | ICD-10-CM

## 2020-08-29 DIAGNOSIS — J9600 Acute respiratory failure, unspecified whether with hypoxia or hypercapnia: Secondary | ICD-10-CM

## 2020-08-29 DIAGNOSIS — R64 Cachexia: Secondary | ICD-10-CM | POA: Diagnosis present

## 2020-08-29 DIAGNOSIS — Z789 Other specified health status: Secondary | ICD-10-CM | POA: Diagnosis not present

## 2020-08-29 DIAGNOSIS — J9611 Chronic respiratory failure with hypoxia: Secondary | ICD-10-CM | POA: Diagnosis present

## 2020-08-29 DIAGNOSIS — R7881 Bacteremia: Secondary | ICD-10-CM | POA: Diagnosis not present

## 2020-08-29 DIAGNOSIS — T17998A Other foreign object in respiratory tract, part unspecified causing other injury, initial encounter: Secondary | ICD-10-CM | POA: Diagnosis present

## 2020-08-29 DIAGNOSIS — I129 Hypertensive chronic kidney disease with stage 1 through stage 4 chronic kidney disease, or unspecified chronic kidney disease: Secondary | ICD-10-CM | POA: Diagnosis present

## 2020-08-29 DIAGNOSIS — J189 Pneumonia, unspecified organism: Secondary | ICD-10-CM | POA: Diagnosis present

## 2020-08-29 DIAGNOSIS — Z79890 Hormone replacement therapy (postmenopausal): Secondary | ICD-10-CM

## 2020-08-29 DIAGNOSIS — R6521 Severe sepsis with septic shock: Secondary | ICD-10-CM | POA: Diagnosis present

## 2020-08-29 DIAGNOSIS — Z7901 Long term (current) use of anticoagulants: Secondary | ICD-10-CM | POA: Diagnosis not present

## 2020-08-29 DIAGNOSIS — R109 Unspecified abdominal pain: Secondary | ICD-10-CM

## 2020-08-29 DIAGNOSIS — R1310 Dysphagia, unspecified: Secondary | ICD-10-CM | POA: Diagnosis not present

## 2020-08-29 DIAGNOSIS — Z86718 Personal history of other venous thrombosis and embolism: Secondary | ICD-10-CM

## 2020-08-29 DIAGNOSIS — A419 Sepsis, unspecified organism: Secondary | ICD-10-CM | POA: Diagnosis present

## 2020-08-29 DIAGNOSIS — Z930 Tracheostomy status: Secondary | ICD-10-CM

## 2020-08-29 DIAGNOSIS — J9622 Acute and chronic respiratory failure with hypercapnia: Secondary | ICD-10-CM | POA: Diagnosis present

## 2020-08-29 DIAGNOSIS — J9690 Respiratory failure, unspecified, unspecified whether with hypoxia or hypercapnia: Secondary | ICD-10-CM

## 2020-08-29 DIAGNOSIS — J449 Chronic obstructive pulmonary disease, unspecified: Secondary | ICD-10-CM | POA: Diagnosis not present

## 2020-08-29 DIAGNOSIS — R112 Nausea with vomiting, unspecified: Secondary | ICD-10-CM | POA: Diagnosis not present

## 2020-08-29 DIAGNOSIS — E43 Unspecified severe protein-calorie malnutrition: Secondary | ICD-10-CM | POA: Diagnosis not present

## 2020-08-29 DIAGNOSIS — E440 Moderate protein-calorie malnutrition: Secondary | ICD-10-CM | POA: Diagnosis not present

## 2020-08-29 DIAGNOSIS — J40 Bronchitis, not specified as acute or chronic: Secondary | ICD-10-CM | POA: Diagnosis not present

## 2020-08-29 DIAGNOSIS — Z9911 Dependence on respirator [ventilator] status: Secondary | ICD-10-CM | POA: Diagnosis not present

## 2020-08-29 DIAGNOSIS — G40909 Epilepsy, unspecified, not intractable, without status epilepticus: Secondary | ICD-10-CM | POA: Diagnosis present

## 2020-08-29 DIAGNOSIS — F419 Anxiety disorder, unspecified: Secondary | ICD-10-CM | POA: Diagnosis not present

## 2020-08-29 DIAGNOSIS — R5381 Other malaise: Secondary | ICD-10-CM | POA: Diagnosis not present

## 2020-08-29 DIAGNOSIS — R1312 Dysphagia, oropharyngeal phase: Secondary | ICD-10-CM | POA: Diagnosis not present

## 2020-08-29 DIAGNOSIS — K9423 Gastrostomy malfunction: Secondary | ICD-10-CM | POA: Diagnosis present

## 2020-08-29 DIAGNOSIS — Z931 Gastrostomy status: Secondary | ICD-10-CM | POA: Diagnosis not present

## 2020-08-29 DIAGNOSIS — B965 Pseudomonas (aeruginosa) (mallei) (pseudomallei) as the cause of diseases classified elsewhere: Secondary | ICD-10-CM | POA: Diagnosis present

## 2020-08-29 DIAGNOSIS — Z7951 Long term (current) use of inhaled steroids: Secondary | ICD-10-CM

## 2020-08-29 LAB — CBC WITH DIFFERENTIAL/PLATELET
Abs Immature Granulocytes: 0.23 10*3/uL — ABNORMAL HIGH (ref 0.00–0.07)
Basophils Absolute: 0.1 10*3/uL (ref 0.0–0.1)
Basophils Relative: 1 %
Eosinophils Absolute: 0.5 10*3/uL (ref 0.0–0.5)
Eosinophils Relative: 2 %
HCT: 37.9 % (ref 36.0–46.0)
Hemoglobin: 11 g/dL — ABNORMAL LOW (ref 12.0–15.0)
Immature Granulocytes: 1 %
Lymphocytes Relative: 9 %
Lymphs Abs: 2 10*3/uL (ref 0.7–4.0)
MCH: 25.6 pg — ABNORMAL LOW (ref 26.0–34.0)
MCHC: 29 g/dL — ABNORMAL LOW (ref 30.0–36.0)
MCV: 88.1 fL (ref 80.0–100.0)
Monocytes Absolute: 0.8 10*3/uL (ref 0.1–1.0)
Monocytes Relative: 3 %
Neutro Abs: 20 10*3/uL — ABNORMAL HIGH (ref 1.7–7.7)
Neutrophils Relative %: 84 %
Platelets: 438 10*3/uL — ABNORMAL HIGH (ref 150–400)
RBC: 4.3 MIL/uL (ref 3.87–5.11)
RDW: 15.8 % — ABNORMAL HIGH (ref 11.5–15.5)
WBC: 23.7 10*3/uL — ABNORMAL HIGH (ref 4.0–10.5)
nRBC: 0 % (ref 0.0–0.2)

## 2020-08-29 LAB — COMPREHENSIVE METABOLIC PANEL
ALT: 14 U/L (ref 0–44)
AST: 26 U/L (ref 15–41)
Albumin: 3.1 g/dL — ABNORMAL LOW (ref 3.5–5.0)
Alkaline Phosphatase: 84 U/L (ref 38–126)
Anion gap: 10 (ref 5–15)
BUN: 14 mg/dL (ref 6–20)
CO2: 31 mmol/L (ref 22–32)
Calcium: 10.2 mg/dL (ref 8.9–10.3)
Chloride: 96 mmol/L — ABNORMAL LOW (ref 98–111)
Creatinine, Ser: 0.96 mg/dL (ref 0.44–1.00)
GFR, Estimated: >60 mL/min (ref >60)
Glucose, Bld: 184 mg/dL — ABNORMAL HIGH (ref 70–99)
Potassium: 4.2 mmol/L (ref 3.5–5.1)
Sodium: 137 mmol/L (ref 135–145)
Total Bilirubin: 0.7 mg/dL (ref 0.3–1.2)
Total Protein: 8.6 g/dL — ABNORMAL HIGH (ref 6.5–8.1)

## 2020-08-29 LAB — URINALYSIS, ROUTINE W REFLEX MICROSCOPIC
Bilirubin Urine: NEGATIVE
Glucose, UA: 50 mg/dL — AB
Hgb urine dipstick: NEGATIVE
Ketones, ur: NEGATIVE mg/dL
Leukocytes,Ua: NEGATIVE
Nitrite: NEGATIVE
Protein, ur: 100 mg/dL — AB
Specific Gravity, Urine: 1.009 (ref 1.005–1.030)
pH: 8 (ref 5.0–8.0)

## 2020-08-29 LAB — POCT I-STAT 7, (LYTES, BLD GAS, ICA,H+H)
Acid-Base Excess: 4 mmol/L — ABNORMAL HIGH (ref 0.0–2.0)
Bicarbonate: 29.5 mmol/L — ABNORMAL HIGH (ref 20.0–28.0)
Calcium, Ion: 1.3 mmol/L (ref 1.15–1.40)
HCT: 31 % — ABNORMAL LOW (ref 36.0–46.0)
Hemoglobin: 10.5 g/dL — ABNORMAL LOW (ref 12.0–15.0)
O2 Saturation: 98 %
Patient temperature: 98.5
Potassium: 4.2 mmol/L (ref 3.5–5.1)
Sodium: 137 mmol/L (ref 135–145)
TCO2: 31 mmol/L (ref 22–32)
pCO2 arterial: 46.3 mmHg (ref 32.0–48.0)
pH, Arterial: 7.411 (ref 7.350–7.450)
pO2, Arterial: 99 mmHg (ref 83.0–108.0)

## 2020-08-29 LAB — LACTIC ACID, PLASMA: Lactic Acid, Venous: 0.9 mmol/L (ref 0.5–1.9)

## 2020-08-29 LAB — RESP PANEL BY RT-PCR (FLU A&B, COVID) ARPGX2
Influenza A by PCR: NEGATIVE
Influenza B by PCR: NEGATIVE
SARS Coronavirus 2 by RT PCR: NEGATIVE

## 2020-08-29 LAB — I-STAT CHEM 8, ED
BUN: 17 mg/dL (ref 6–20)
Calcium, Ion: 1.26 mmol/L (ref 1.15–1.40)
Chloride: 98 mmol/L (ref 98–111)
Creatinine, Ser: 0.9 mg/dL (ref 0.44–1.00)
Glucose, Bld: 188 mg/dL — ABNORMAL HIGH (ref 70–99)
HCT: 36 % (ref 36.0–46.0)
Hemoglobin: 12.2 g/dL (ref 12.0–15.0)
Potassium: 4.3 mmol/L (ref 3.5–5.1)
Sodium: 137 mmol/L (ref 135–145)
TCO2: 32 mmol/L (ref 22–32)

## 2020-08-29 LAB — PROTIME-INR
INR: 1.4 — ABNORMAL HIGH (ref 0.8–1.2)
Prothrombin Time: 17.4 seconds — ABNORMAL HIGH (ref 11.4–15.2)

## 2020-08-29 LAB — LIPASE, BLOOD: Lipase: 25 U/L (ref 11–51)

## 2020-08-29 LAB — GLUCOSE, CAPILLARY: Glucose-Capillary: 173 mg/dL — ABNORMAL HIGH (ref 70–99)

## 2020-08-29 LAB — APTT: aPTT: 37 seconds — ABNORMAL HIGH (ref 24–36)

## 2020-08-29 MED ORDER — NOREPINEPHRINE 4 MG/250ML-% IV SOLN: 2.0000 ug/min | INTRAVENOUS | Status: DC

## 2020-08-29 MED ORDER — SODIUM CHLORIDE 3 % IN NEBU
4.0000 mL | INHALATION_SOLUTION | RESPIRATORY_TRACT | Status: DC | PRN
  Filled 2020-08-29: qty 4

## 2020-08-29 MED ORDER — ROSUVASTATIN CALCIUM 20 MG PO TABS
40.0000 mg | ORAL_TABLET | Freq: Every day | ORAL | Status: DC
  Administered 2020-08-30 – 2020-09-18 (×17): 40 mg
  Filled 2020-08-29 (×17): qty 2

## 2020-08-29 MED ORDER — BISACODYL 10 MG RE SUPP: 10.0000 mg | Freq: Every day | RECTAL | Status: DC | PRN

## 2020-08-29 MED ORDER — VENLAFAXINE HCL 37.5 MG PO TABS
37.5000 mg | ORAL_TABLET | Freq: Two times a day (BID) | ORAL | Status: DC
  Administered 2020-08-30 – 2020-09-18 (×36): 37.5 mg
  Filled 2020-08-29 (×40): qty 1

## 2020-08-29 MED ORDER — SODIUM CHLORIDE 0.9 % IV SOLN
2.0000 g | Freq: Once | INTRAVENOUS | Status: DC
  Administered 2020-08-29: 2 g via INTRAVENOUS

## 2020-08-29 MED ORDER — TEMAZEPAM 15 MG PO CAPS: 15.0000 mg | ORAL_CAPSULE | Freq: Every evening | ORAL | Status: DC | PRN

## 2020-08-29 MED ORDER — IPRATROPIUM-ALBUTEROL 0.5-2.5 (3) MG/3ML IN SOLN: 3.0000 mL | RESPIRATORY_TRACT | Status: DC

## 2020-08-29 MED ORDER — IPRATROPIUM-ALBUTEROL 0.5-2.5 (3) MG/3ML IN SOLN: 3.0000 mL | RESPIRATORY_TRACT | Status: DC | PRN

## 2020-08-29 MED ORDER — ACETAMINOPHEN 325 MG PO TABS
650.0000 mg | ORAL_TABLET | Freq: Once | ORAL | Status: AC
  Administered 2020-08-29: 650 mg
  Filled 2020-08-29: qty 2

## 2020-08-29 MED ORDER — LACTATED RINGERS IV BOLUS (SEPSIS)
1000.0000 mL | Freq: Once | INTRAVENOUS | Status: AC
  Administered 2020-08-29: 1000 mL via INTRAVENOUS

## 2020-08-29 MED ORDER — WHITE PETROLATUM EX OINT
TOPICAL_OINTMENT | CUTANEOUS | Status: AC
  Filled 2020-08-29: qty 28.35

## 2020-08-29 MED ORDER — ARFORMOTEROL TARTRATE 15 MCG/2ML IN NEBU
15.0000 ug | INHALATION_SOLUTION | Freq: Two times a day (BID) | RESPIRATORY_TRACT | Status: DC
  Administered 2020-08-30 – 2020-09-18 (×34): 15 ug via RESPIRATORY_TRACT
  Filled 2020-08-29 (×43): qty 2

## 2020-08-29 MED ORDER — LACTATED RINGERS IV BOLUS (SEPSIS): 1000.0000 mL | Freq: Once | INTRAVENOUS | Status: DC

## 2020-08-29 MED ORDER — PIPERACILLIN-TAZOBACTAM 3.375 G IVPB 30 MIN: 3.3750 g | Freq: Four times a day (QID) | INTRAVENOUS | Status: DC

## 2020-08-29 MED ORDER — LACTATED RINGERS IV BOLUS
500.0000 mL | Freq: Once | INTRAVENOUS | Status: AC
  Administered 2020-08-29: 500 mL via INTRAVENOUS

## 2020-08-29 MED ORDER — MORPHINE SULFATE (PF) 4 MG/ML IV SOLN
4.0000 mg | Freq: Once | INTRAVENOUS | Status: AC
Start: 2020-08-29 — End: 2020-08-29
  Administered 2020-08-29: 4 mg via INTRAVENOUS
  Filled 2020-08-29: qty 1

## 2020-08-29 MED ORDER — VANCOMYCIN HCL 750 MG/150ML IV SOLN
750.0000 mg | Freq: Two times a day (BID) | INTRAVENOUS | Status: DC
  Administered 2020-08-30: 750 mg via INTRAVENOUS
  Filled 2020-08-29 (×2): qty 150

## 2020-08-29 MED ORDER — PIPERACILLIN-TAZOBACTAM 3.375 G IVPB
3.3750 g | Freq: Three times a day (TID) | INTRAVENOUS | Status: AC
  Administered 2020-08-29 – 2020-09-04 (×19): 3.375 g via INTRAVENOUS
  Filled 2020-08-29 (×19): qty 50

## 2020-08-29 MED ORDER — ONDANSETRON HCL 4 MG/2ML IJ SOLN
4.0000 mg | Freq: Four times a day (QID) | INTRAMUSCULAR | Status: DC | PRN
  Administered 2020-08-30 – 2020-09-03 (×5): 4 mg via INTRAVENOUS
  Filled 2020-08-29 (×5): qty 2

## 2020-08-29 MED ORDER — CHLORHEXIDINE GLUCONATE CLOTH 2 % EX PADS
6.0000 | MEDICATED_PAD | Freq: Every day | CUTANEOUS | Status: DC
  Administered 2020-08-30 – 2020-09-18 (×20): 6 via TOPICAL

## 2020-08-29 MED ORDER — LACTATED RINGERS IV SOLN: INTRAVENOUS | Status: AC

## 2020-08-29 MED ORDER — BUDESONIDE 0.25 MG/2ML IN SUSP
0.2500 mg | Freq: Two times a day (BID) | RESPIRATORY_TRACT | Status: DC
  Administered 2020-08-29 – 2020-09-18 (×37): 0.25 mg via RESPIRATORY_TRACT
  Filled 2020-08-29 (×39): qty 2

## 2020-08-29 MED ORDER — METRONIDAZOLE 500 MG/100ML IV SOLN
500.0000 mg | Freq: Once | INTRAVENOUS | Status: AC
  Administered 2020-08-29: 500 mg via INTRAVENOUS
  Filled 2020-08-29: qty 100

## 2020-08-29 MED ORDER — CLONAZEPAM 0.5 MG PO TABS
0.5000 mg | ORAL_TABLET | Freq: Three times a day (TID) | ORAL | Status: DC
  Administered 2020-08-29 – 2020-09-06 (×22): 0.5 mg
  Filled 2020-08-29 (×23): qty 1

## 2020-08-29 MED ORDER — METOCLOPRAMIDE HCL 5 MG/5ML PO SOLN
10.0000 mg | Freq: Three times a day (TID) | ORAL | Status: DC | PRN
  Filled 2020-08-29: qty 10

## 2020-08-29 MED ORDER — PANTOPRAZOLE SODIUM 40 MG PO PACK: 40.0000 mg | PACK | Freq: Every day | ORAL | Status: DC

## 2020-08-29 MED ORDER — PANTOPRAZOLE SODIUM 40 MG IV SOLR
40.0000 mg | INTRAVENOUS | Status: DC
  Administered 2020-08-29: 40 mg via INTRAVENOUS
  Filled 2020-08-29: qty 40

## 2020-08-29 MED ORDER — LEVOTHYROXINE SODIUM 25 MCG PO TABS
50.0000 ug | ORAL_TABLET | Freq: Every day | ORAL | Status: DC
  Administered 2020-08-30: 50 ug via ORAL
  Filled 2020-08-29: qty 2

## 2020-08-29 MED ORDER — SODIUM CHLORIDE 0.9 % IV SOLN
250.0000 mL | INTRAVENOUS | Status: DC
  Administered 2020-08-29 – 2020-08-30 (×2): 250 mL via INTRAVENOUS

## 2020-08-29 MED ORDER — POLYETHYLENE GLYCOL 3350 17 G PO PACK: 17.0000 g | PACK | Freq: Every day | ORAL | Status: DC | PRN

## 2020-08-29 MED ORDER — VANCOMYCIN HCL 1250 MG/250ML IV SOLN
1250.0000 mg | Freq: Once | INTRAVENOUS | Status: AC
  Administered 2020-08-29: 1250 mg via INTRAVENOUS
  Filled 2020-08-29: qty 250

## 2020-08-29 MED ORDER — SODIUM CHLORIDE 0.9 % IV BOLUS: 1000.0000 mL | Freq: Once | INTRAVENOUS | Status: DC

## 2020-08-29 MED ORDER — SODIUM CHLORIDE 0.9 % IV SOLN
2.0000 g | Freq: Three times a day (TID) | INTRAVENOUS | Status: DC
  Filled 2020-08-29: qty 2

## 2020-08-29 MED ORDER — LACTULOSE 10 GM/15ML PO SOLN
10.0000 g | Freq: Every day | ORAL | Status: DC
  Administered 2020-08-30 – 2020-09-03 (×5): 10 g
  Filled 2020-08-29 (×6): qty 15

## 2020-08-29 MED ORDER — CLONAZEPAM 1 MG PO TABS: 0.5000 mg | ORAL_TABLET | Freq: Three times a day (TID) | ORAL | 0 refills | Status: DC | PRN

## 2020-08-29 MED ORDER — POLYETHYLENE GLYCOL 3350 17 G PO PACK
17.0000 g | PACK | Freq: Every day | ORAL | Status: DC | PRN
  Administered 2020-08-31: 17 g
  Filled 2020-08-29: qty 1

## 2020-08-29 MED ORDER — PHENOL 1.4 % MT LIQD
1.0000 | OROMUCOSAL | Status: DC | PRN
  Filled 2020-08-29: qty 177

## 2020-08-29 MED ORDER — SODIUM CHLORIDE 3 % IN NEBU
4.0000 mL | INHALATION_SOLUTION | Freq: Every day | RESPIRATORY_TRACT | Status: AC
  Administered 2020-08-30 – 2020-09-01 (×3): 4 mL via RESPIRATORY_TRACT
  Filled 2020-08-29 (×3): qty 4

## 2020-08-29 MED ORDER — REVEFENACIN 175 MCG/3ML IN SOLN
175.0000 ug | Freq: Every day | RESPIRATORY_TRACT | Status: DC
  Administered 2020-08-30 – 2020-09-18 (×19): 175 ug via RESPIRATORY_TRACT
  Filled 2020-08-29 (×20): qty 3

## 2020-08-29 MED ORDER — OXYCODONE HCL 5 MG PO TABS
5.0000 mg | ORAL_TABLET | Freq: Once | ORAL | Status: AC
Start: 2020-08-29 — End: 2020-08-29
  Administered 2020-08-29: 5 mg
  Filled 2020-08-29: qty 1

## 2020-08-29 MED ORDER — IPRATROPIUM-ALBUTEROL 0.5-2.5 (3) MG/3ML IN SOLN
3.0000 mL | RESPIRATORY_TRACT | Status: DC
  Administered 2020-08-29 – 2020-09-03 (×28): 3 mL via RESPIRATORY_TRACT
  Filled 2020-08-29 (×28): qty 3

## 2020-08-29 MED ORDER — DOCUSATE SODIUM 100 MG PO CAPS: 100.0000 mg | ORAL_CAPSULE | Freq: Two times a day (BID) | ORAL | Status: DC | PRN

## 2020-08-29 MED ORDER — SIMETHICONE 40 MG/0.6ML PO SUSP
40.0000 mg | Freq: Four times a day (QID) | ORAL | Status: DC
  Administered 2020-08-29 – 2020-08-30 (×2): 40 mg via ORAL
  Filled 2020-08-29 (×4): qty 0.6

## 2020-08-29 MED ORDER — HYDROMORPHONE HCL 1 MG/ML IJ SOLN
1.0000 mg | INTRAMUSCULAR | Status: DC | PRN
Start: 2020-08-29 — End: 2020-09-03
  Administered 2020-08-30 – 2020-09-03 (×8): 1 mg via INTRAVENOUS
  Filled 2020-08-29 (×8): qty 1

## 2020-08-29 MED ORDER — APIXABAN 5 MG PO TABS
5.0000 mg | ORAL_TABLET | Freq: Two times a day (BID) | ORAL | Status: DC
  Administered 2020-08-29 – 2020-09-06 (×16): 5 mg
  Filled 2020-08-29 (×16): qty 1

## 2020-08-29 NOTE — Progress Notes (Addendum)
Pharmacy Antibiotic Note  Kathleen Cameron is a 55 y.o. female admitted on 08/29/2020 with AMS and SOB concerning for infection of unknown source.  Pharmacy has been consulted for Cefepime and vancomycin dosing.  WBC 23.7, SCr wnl  Plan: -Cefepime 2 gm IV Q 8 hours -Vancomycin 1250 mg IV load followed by Vancomycin 750 mg IV Q 12 hrs. Goal AUC 400-550. Expected AUC: 526 SCr used: 0.9 -Monitor CBC, renal fx, cultures and clinical progress -Vanc levels as indicated    Height: 5\' 5"  (165.1 cm) Weight: 59 kg (130 lb) IBW/kg (Calculated) : 57  Temp (24hrs), Avg:99.6 F (37.6 C), Min:98.6 F (37 C), Max:100.6 F (38.1 C)  Recent Labs  Lab 08/29/20 1654  CREATININE 0.90    Estimated Creatinine Clearance: 62.1 mL/min (by C-G formula based on SCr of 0.9 mg/dL).    Allergies  Allergen Reactions  . Stiolto Respimat [Tiotropium Bromide-Olodaterol] Other (See Comments)    Severe headaches and rapid heartrate    Antimicrobials this admission: Cefepime 5/11 >>  Vanc 5/11 >>   Dose adjustments this admission:   Microbiology results: 5/11 BCx:  5/11 UCx:     Thank you for allowing pharmacy to be a part of this patient's care.  7/11, PharmD., BCPS, BCCCP Clinical Pharmacist Please refer to [facility] for unit-specific pharmacist   Addendum: Now switching cefepime to Zosyn for added anaerobic coverage. Will stop cefepime and start Zosyn 3.375 gm IV Q 8 hours (EI infusion).  [facility], PharmD., BCPS, BCCCP Clinical Pharmacist Please refer to [facility] for unit-specific pharmacist

## 2020-08-29 NOTE — Telephone Encounter (Signed)
[facility] calling, states Dr. Alvy Bimler didn't actually sign the order so they need him to sign it and send it back

## 2020-08-29 NOTE — Telephone Encounter (Signed)
Spoke to [place] at [facility], she stated the order for increasing hours from 56 back to 100 needed to be signed on the last page. I had Dr Alvy Bimler to sign it and faxed it back.

## 2020-08-29 NOTE — Telephone Encounter (Signed)
ATC Iva from [facility] at [phone] and line rang a few times and then got a busy signal. ATC patient at [phone] and [facility]

## 2020-08-29 NOTE — Telephone Encounter (Signed)
ON 08/28/2020, faxed signed order [id number]) Attn: Meriam Sprague. The order to increase hours. Confirmation received OK.

## 2020-08-29 NOTE — Consult Note (Signed)
NAME:  Kathleen Cameron, MRN:  [id number], DOB:  1966/01/13, LOS: 0 ADMISSION DATE:  08/29/2020, CONSULTATION DATE:  5/11 REFERRING MD:  Dr. Posey Pronto, CHIEF COMPLAINT:  SOB  History of Present Illness:  Kathleen Cameron is a 55 y.o. who presented on 5/11 to the [facility] ED with complaints of SOB and increased WOB.  Per chart review, Kathleen Cameron has chronic respiratory failure, with tracheostomy on home mechanical ventilation since 02/2020. She is reported to be conversant and oriented at baseline. She was recently admitted to [facility] on 4/11 to 4/14 for hypoxia. It is reported that her Trach/vent is poorly managed at home, with frequent complications (Recently admitted 4/2-4/7 after mucus plug causing respiratory arrest and tx for Corynebacterium straitum PNA and ileus, ED 3/25, Admission 3/15-3/21, admission 2/13-3/15). The patient and family refused all destinations except for home, with home health.   She is seen by [name] ACNP at [facility] in trach clinic. On 4/27 her cuffed 6.0 trach was removed but was replaced with a 6.0 cuffed trach. She has a history of severe COPD, recurrent pneumonia, mucous plugging, severe deconditioning, Asthma, HTN Seizure, GAD.  While at home today (5/11) she desaturated to 85% on her home ventilator and had increased SOB. This was not relieved by suctioning or nebulizer. He home pulmonary regimen includes Albuterol, Brovana, Pulmicort, and Yuperlri. There were some questions to if she was taking her medications as prescribed. Her home ventilator settings per last admission are Oval 5, RT 16, TV 480 on a triliogy vent.  While she was in the [facility] ED she was found to be septic with altered mental status. She was found to be febrile with a leukocytosis of 23.7. A code sepsis was called. Fluid resusitaion was started and cultures were drawn. She was started on Pip-tazo, Vanc, and recived one dose of flagyl. Head CT was negative for acute process. CT abdomen and pelvis is in  process. CXR demonstrates hyperinflation consistent with her COPD, no infiltrate is seen. Per RT documentation, several mucous plugs were removed after bag lavage in the ED.  She is to be admitted to [facility] ICU by the hospitalist service.  PCCM is consulted for ventilatory management.   Pertinent  Medical History  COPD, Chronic respiratory failure with tracheostomy on home ventilation Asthma, HTN Seizure GAD CKD Upper GIB DVT Ileus  Significant Hospital Events: Including procedures, antibiotic start and stop dates in addition to other pertinent events   . 5/11 Increasing SOB, presented to [facility] ED, code sepsis called, Pip-tazo>, Vanc> flagyl x1, head ct neg, CT abd pelvis> , UC> , BC>  Interim History / Subjective:  See above  Unable to obtain subjective evaluation due to patient status   Objective   Blood pressure 115/87, pulse (!) 109, temperature (!) 100.6 F (38.1 C), temperature source Rectal, resp. rate 13, height $RemoveBe'5\' 5"'UpFQfAxdv$  (1.651 m), weight 59 kg, SpO2 100 %.    Vent Mode: PRVC FiO2 (%):  [50 %] 50 % Set Rate:  [18 bmp] 18 bmp Vt Set:  [450 mL] 450 mL PEEP:  [5 cmH20] 5 cmH20 Plateau Pressure:  [27 cmH20-44 cmH20] 27 cmH20   Intake/Output Summary (Last 24 hours) at 08/29/2020 2051 Last data filed at 08/29/2020 1824 Gross per 24 hour  Intake 100 ml  Output --  Net 100 ml   Filed Weights   08/29/20 1609  Weight: 59 kg    Examination: General: Ill appearing, frail, deconditioned, appears uncomfortable HEENT: MM pink/moist, anicteric, trachea midline, 6.0  cuffed trach, slight blood on dressing, mucous at stoma  Neuro: GCS 11t, RASS 0, PERRL 61mm CV: S1S2, ST, no m/r/g appreciated PULM:  clear in the upper lobes, clear in the lower lobes, slight yellow/blood tinged secretions, chest expansion symmetric GI: distended, bsx4 active, generalized tenderness   Extremities: warm/dry, no pretibal edema, capillary refill less than 3 seconds  Skin: no rashes or lesions  appreciated   Labs/imaging that I havepersonally reviewed  (right click and "Reselect all SmartList Selections" daily)  CBC, CMP, Head CT, CXR, lactate  Resolved Hospital Problem list     Assessment & Plan:  Chronic Respiratory Failure with Tracheostomy requiring Mechanical Ventilation On MV at home since 02/2020. Multiple admissions to [facility] over the past 4 months for SOB and  -Aggressive Pulmonary Hygiene. Care with suctioning to avoid tracheal trauma. VAP bundle -Start Scheduled saline nebulizers -Continue scheduled Duo-nebs -Pulmicort, Candiss Norse were recently discontinued. Will initiate them again for this admission -Starting on PRVC 40%, PEEP 5, RT 16, TV at 8cc per kg 420. -Continue 6.0 Cuffed Tracheostomy at this time -Recommend aggressive bowel regimen. Adding lactulose per tube. -Continue outpatient follow up with trach clinic  All other issues per primary  PCCM to continue to follow for respiratory issues.   Best practice (right click and "Reselect all SmartList Selections" daily)  Diet:  NPO Pain/Anxiety/Delirium protocol (if indicated): No VAP protocol (if indicated): Yes DVT prophylaxis: Systemic AC on eliquis GI prophylaxis: PPI Glucose control:  SSI No Central venous access:  N/A Arterial line:  N/A Foley:  N/A Mobility:  bed rest  PT consulted: N/A Last date of multidisciplinary goals of care discussion [Per primary] Code Status:  full code Disposition: SD  Labs   CBC: Recent Labs  Lab 08/29/20 1645 08/29/20 1654  WBC 23.7*  --   NEUTROABS 20.0*  --   HGB 11.0* 12.2  HCT 37.9 36.0  MCV 88.1  --   PLT 438*  --     Basic Metabolic Panel: Recent Labs  Lab 08/29/20 1645 08/29/20 1654  NA 137 137  K 4.2 4.3  CL 96* 98  CO2 31  --   GLUCOSE 184* 188*  BUN 14 17  CREATININE 0.96 0.90  CALCIUM 10.2  --    GFR: Estimated Creatinine Clearance: 62.1 mL/min (by C-G formula based on SCr of 0.9 mg/dL). Recent Labs  Lab 08/29/20 1645   WBC 23.7*  LATICACIDVEN 0.9    Liver Function Tests: Recent Labs  Lab 08/29/20 1645  AST 26  ALT 14  ALKPHOS 84  BILITOT 0.7  PROT 8.6*  ALBUMIN 3.1*   Recent Labs  Lab 08/29/20 1645  LIPASE 25   No results for input(s): AMMONIA in the last 168 hours.  ABG    Component Value Date/Time   PHART 7.386 07/12/2020 2019   PCO2ART 55.8 (H) 07/12/2020 2019   PO2ART 90 07/12/2020 2019   HCO3 33.4 (H) 07/12/2020 2019   TCO2 32 08/29/2020 1654   O2SAT 96.0 07/12/2020 2019     Coagulation Profile: Recent Labs  Lab 08/29/20 1645  INR 1.4*    Cardiac Enzymes: No results for input(s): CKTOTAL, CKMB, CKMBINDEX, TROPONINI in the last 168 hours.  HbA1C: Hgb A1c MFr Bld  Date/Time Value Ref Range Status  06/21/2020 02:24 AM 5.3 4.8 - 5.6 % Final    Comment:    (NOTE) Pre diabetes:          5.7%-6.4%  Diabetes:              >  6.4%  Glycemic control for   <7.0% adults with diabetes   02/26/2020 05:18 PM 5.4 4.8 - 5.6 % Final    Comment:    (NOTE) Pre diabetes:          5.7%-6.4%  Diabetes:              >6.4%  Glycemic control for   <7.0% adults with diabetes     CBG: No results for input(s): GLUCAP in the last 168 hours.  Review of Systems:   Unable to obtain ROS due to patient status   Past Medical History:  She,  has a past medical history of Acute on chronic respiratory failure with hypoxia ([place]), Anxiety disorder, Asthma, COPD (chronic obstructive pulmonary disease) ([place]), COPD, severe ([place]), Hypertension, and Seizure disorder ([place]).   Surgical History:   Past Surgical History:  Procedure Laterality Date  . COLONOSCOPY N/A 06/26/2020   Procedure: COLONOSCOPY;  Surgeon: Carol Ada, MD;  Location: [place];  Service: Endoscopy;  Laterality: N/A;  . ENTEROSCOPY N/A 06/26/2020   Procedure: ENTEROSCOPY;  Surgeon: Carol Ada, MD;  Location: [place];  Service: Endoscopy;  Laterality: N/A;  . HEMOSTASIS CLIP PLACEMENT  06/26/2020   Procedure:  HEMOSTASIS CLIP PLACEMENT;  Surgeon: Carol Ada, MD;  Location: [place];  Service: Endoscopy;;  . HOT HEMOSTASIS N/A 06/26/2020   Procedure: HOT HEMOSTASIS (ARGON PLASMA COAGULATION/BICAP);  Surgeon: Carol Ada, MD;  Location: [place];  Service: Endoscopy;  Laterality: N/A;  . IR FLUORO RM 30-60 MIN  03/23/2020  . LAPAROSCOPIC INSERTION GASTROSTOMY TUBE N/A 04/24/2020   Procedure: LAPAROSCOPIC GASTROSTOMY TUBE PLACEMENT;  Surgeon: Kieth Brightly Arta Bruce, MD;  Location: [place];  Service: General;  Laterality: N/A;  . POLYPECTOMY  06/26/2020   Procedure: POLYPECTOMY;  Surgeon: Carol Ada, MD;  Location: [place];  Service: Endoscopy;;  . WISDOM TOOTH EXTRACTION       Social History:   reports that she has never smoked. She has never used smokeless tobacco. She reports current alcohol use of about 2.0 standard drinks of alcohol per week. She reports that she does not use drugs.   Family History:  Her Family history is unknown by patient.   Allergies Allergies  Allergen Reactions  . Stiolto Respimat [Tiotropium Bromide-Olodaterol] Other (See Comments)    Severe headaches and rapid heartrate     Home Medications  Prior to Admission medications   Medication Sig Start Date End Date Taking? Authorizing Provider  amLODipine (NORVASC) 5 MG tablet Take 1 tablet (5 mg total) by mouth daily. 08/24/20  Yes Sagardia, Ines Bloomer, MD  apixaban (ELIQUIS) 5 MG TABS tablet Place 1 tablet (5 mg total) into feeding tube 2 (two) times daily. 08/20/20 12/18/20 Yes Sagardia, Ines Bloomer, MD  arformoterol [facility]) 15 MCG/2ML NEBU Take 2 mLs (15 mcg total) by nebulization 2 (two) times daily. 07/10/20  Yes Candee Furbish, MD  bisacodyl (DULCOLAX) 10 MG suppository Place 1 suppository (10 mg total) rectally daily as needed for severe constipation. 07/10/20  Yes Sagardia, Ines Bloomer, MD  budesonide (PULMICORT) 0.25 MG/2ML nebulizer solution Take 2 mLs (0.25 mg total) by nebulization 2 (two) times daily.  07/10/20  Yes Candee Furbish, MD  chlorhexidine gluconate, MEDLINE KIT, (PERIDEX) 0.12 % solution 15 mLs by Mouth Rinse route 2 (two) times daily. 07/10/20  Yes Candee Furbish, MD  clonazePAM (KLONOPIN) 1 MG tablet Place 1/2 tablet (0.5 mg total) into feeding tube 3 (three) times daily as needed for anxiety. 08/29/20  Yes Sagardia,  Ines Bloomer, MD  ipratropium-albuterol (DUONEB) 0.5-2.5 (3) MG/3ML SOLN Take 3 mLs by nebulization every 4 (four) hours. 07/22/20  Yes [provider]  levothyroxine (SYNTHROID) 50 MCG tablet Take 1 tablet (50 mcg total) by mouth daily at 6 (six) AM. 08/17/20  Yes Sagardia, Ines Bloomer, MD  lip balm (CARMEX) ointment Apply topically as needed for lip care. 03/08/20  Yes Candee Furbish, MD  metoCLOPramide (REGLAN) 5 MG/5ML solution Place 10 mLs (10 mg total) into feeding tube in the morning and at bedtime. 07/25/20  Yes Candee Furbish, MD  Multiple Vitamin (MULTIVITAMIN WITH MINERALS) TABS tablet Place 1 tablet into feeding tube daily. 07/03/20 10/01/20 Yes Hall, Lorenda Cahill, DO  Nutritional Supplements (ENSURE ACTIVE HIGH PROTEIN) LIQD Take 1 Can by mouth in the morning and at bedtime. 07/09/20  Yes Barb Merino, MD  Nutritional Supplements (FEEDING SUPPLEMENT, OSMOLITE 1.2 CAL,) LIQD Place 1,000 mLs into feeding tube continuous. 07/10/20  Yes Sagardia, Ines Bloomer, MD  oxyCODONE-acetaminophen (PERCOCET/ROXICET) 5-325 MG tablet PLACE 1 TABLET INTO FEEDING TUBE EVERY EIGHT HOURS AS NEEDED FOR UP TO 3 DAYS FOR SEVERE PAIN. Patient taking differently: Take 1 tablet by mouth every 6 (six) hours as needed for moderate pain. 07/02/20 12/29/20 Yes Hall, Carole N, DO  pantoprazole sodium (PROTONIX) 40 mg/20 mL PACK Place 20 mLs (40 mg total) into feeding tube at bedtime. 08/24/20  Yes Sagardia, Ines Bloomer, MD  phenol (CHLORASEPTIC) 1.4 % LIQD Use as directed 1 spray in the mouth or throat as needed for throat irritation / pain. 03/08/20  Yes Candee Furbish, MD  polyethylene glycol  (MIRALAX / GLYCOLAX) 17 g packet Place 17 g into feeding tube daily as needed for mild constipation. 03/08/20  Yes Candee Furbish, MD  revefenacin [facility]) 175 MCG/3ML nebulizer solution Take 3 mLs (175 mcg total) by nebulization daily. 07/10/20  Yes Candee Furbish, MD  rosuvastatin (CRESTOR) 40 MG tablet Place 1 tablet (40 mg total) into feeding tube daily. 07/10/20  Yes Sagardia, Ines Bloomer, MD  senna-docusate (SENOKOT-S) 8.6-50 MG tablet Place 2 tablets into feeding tube at bedtime as needed for moderate constipation. 03/08/20  Yes Candee Furbish, MD  sodium chloride (OCEAN) 0.65 % SOLN nasal spray Place 1 spray into both nostrils as needed for congestion. 03/08/20  Yes Candee Furbish, MD  sorbitol 70 % SOLN Place 30 mLs into feeding tube daily as needed for moderate constipation. 07/25/20  Yes Candee Furbish, MD  temazepam (RESTORIL) 15 MG capsule Place 1 capsule (15 mg total) into feeding tube at bedtime as needed for sleep. 07/20/20  Yes Horald Pollen, MD  venlafaxine [facility]) 37.5 MG tablet Place 1 tablet (37.5 mg total) into feeding tube 2 (two) times daily with a meal. 07/20/20 10/18/20 Yes Sagardia, Ines Bloomer, MD  Chlorhexidine Gluconate Cloth 2 % PADS Apply 6 each topically daily. 08/21/20   Horald Pollen, MD     Critical care time: N/A

## 2020-08-29 NOTE — Progress Notes (Signed)
eLink Physician-Brief Progress Note Patient Name: Kathleen Cameron DOB: 01/07/1966 MRN: [id number]   Date of Service  08/29/2020  HPI/Events of Note  Patient admitted with acute on chronic hypoxemic / hypercapnic respiratory failure and altered mental status due to long standing COPD. Fever and leukocytosis suggest a focus of infection as well but the CXR looks underwhelming and CT abdomen and pelvis does not identify a focus of infection, work up is in progress. Patient is on the ventilator chronically.  eICU Interventions  New Patient Evaluation.        Migdalia Dk 08/29/2020, 9:37 PM

## 2020-08-29 NOTE — H&P (Signed)
History and Physical    Medrith Veillon QIW:[id number] DOB: 1965-11-04 DOA: 08/29/2020  PCP: Horald Pollen, MD    Patient coming from:  Home.   Chief Complaint:  AMS/Respirtory Distress.  HPI: Kathleen Cameron is a 55 y.o. female seen in ed with complaints of AMS/ respiratory distress, increased work of breathing. Baseline pt is verbal and follows commands and is oriented.Pt is on Xarelto.  Patient is nonverbal.  HPI is per chart review.  Per report patient's noted to be having increased shortness of breath and work of breathing and her O2 sats at home were 85%.  This did not improve with suction and breathing treatment at home.  Patient is vent dependent on an trach at baseline.   Pt has past medical history of COPD, hypertension, Seizure, Hyponatremia, GAD, Tracheostomy, CKD, Hypothyroidism,Pulmonary Nodules.  ED Course:  Vitals:   08/29/20 2030 08/29/20 2100 08/29/20 2110 08/29/20 2126  BP: 115/87  (!) 123/105 (!) 90/56  Pulse: (!) 109  (!) 108 (!) 115  Resp: _0 Temp:      TempSrc:      SpO2: 100% 100% 100% 100%  Weight:      Height:      In ed pt is     Review of Systems:  Review of Systems  Unable to perform ROS: Patient nonverbal     Past Medical History:  Diagnosis Date  . Acute on chronic respiratory failure with hypoxia ([place])   . Anxiety disorder   . Asthma   . COPD (chronic obstructive pulmonary disease) ([place])   . COPD, severe ([place])   . Hypertension   . Seizure disorder [facility])     Past Surgical History:  Procedure Laterality Date  . COLONOSCOPY N/A 06/26/2020   Procedure: COLONOSCOPY;  Surgeon: Carol Ada, MD;  Location: [place];  Service: Endoscopy;  Laterality: N/A;  . ENTEROSCOPY N/A 06/26/2020   Procedure: ENTEROSCOPY;  Surgeon: Carol Ada, MD;  Location: [place];  Service: Endoscopy;  Laterality: N/A;  . HEMOSTASIS CLIP PLACEMENT  06/26/2020   Procedure: HEMOSTASIS CLIP PLACEMENT;  Surgeon: Carol Ada, MD;   Location: [place];  Service: Endoscopy;;  . HOT HEMOSTASIS N/A 06/26/2020   Procedure: HOT HEMOSTASIS (ARGON PLASMA COAGULATION/BICAP);  Surgeon: Carol Ada, MD;  Location: [place];  Service: Endoscopy;  Laterality: N/A;  . IR FLUORO RM 30-60 MIN  03/23/2020  . LAPAROSCOPIC INSERTION GASTROSTOMY TUBE N/A 04/24/2020   Procedure: LAPAROSCOPIC GASTROSTOMY TUBE PLACEMENT;  Surgeon: Kieth Brightly Arta Bruce, MD;  Location: [place];  Service: General;  Laterality: N/A;  . POLYPECTOMY  06/26/2020   Procedure: POLYPECTOMY;  Surgeon: Carol Ada, MD;  Location: [place];  Service: Endoscopy;;  . WISDOM TOOTH EXTRACTION       reports that she has never smoked. She has never used smokeless tobacco. She reports current alcohol use of about 2.0 standard drinks of alcohol per week. She reports that she does not use drugs.  Allergies  Allergen Reactions  . Stiolto Respimat [Tiotropium Bromide-Olodaterol] Other (See Comments)    Severe headaches and rapid heartrate    Family History  Family history unknown: Yes    Prior to Admission medications   Medication Sig Start Date End Date Taking? Authorizing Provider  amLODipine (NORVASC) 5 MG tablet Take 1 tablet (5 mg total) by mouth daily. 08/24/20   Horald Pollen, MD  apixaban (ELIQUIS) 5 MG TABS tablet Place 1 tablet (5 mg total) into feeding tube 2 (two) times daily. 08/20/20  12/18/20  Horald Pollen, MD  arformoterol [facility]) 15 MCG/2ML NEBU Take 2 mLs (15 mcg total) by nebulization 2 (two) times daily. Patient not taking: Reported on 07/31/2020 07/10/20   Candee Furbish, MD  bisacodyl (DULCOLAX) 10 MG suppository Place 1 suppository (10 mg total) rectally daily as needed for severe constipation. 07/10/20   Horald Pollen, MD  budesonide (PULMICORT) 0.25 MG/2ML nebulizer solution Take 2 mLs (0.25 mg total) by nebulization 2 (two) times daily. 07/10/20   Candee Furbish, MD  Chlorhexidine Gluconate Cloth 2 % PADS Apply 6 each  topically daily. 08/21/20   Horald Pollen, MD  chlorhexidine gluconate, MEDLINE KIT, (PERIDEX) 0.12 % solution 15 mLs by Mouth Rinse route 2 (two) times daily. 07/10/20   Candee Furbish, MD  clonazePAM (KLONOPIN) 1 MG tablet Place 1/2 tablet (0.5 mg total) into feeding tube 3 (three) times daily as needed for anxiety. 08/29/20   Horald Pollen, MD  ipratropium-albuterol (DUONEB) 0.5-2.5 (3) MG/3ML SOLN Take 3 mLs by nebulization every 4 (four) hours. 07/22/20   [provider]  levothyroxine (SYNTHROID) 50 MCG tablet Take 1 tablet (50 mcg total) by mouth daily at 6 (six) AM. 08/17/20   Sagardia, Ines Bloomer, MD  lip balm (CARMEX) ointment Apply topically as needed for lip care. 03/08/20   Candee Furbish, MD  metoCLOPramide (REGLAN) 5 MG/5ML solution Place 10 mLs (10 mg total) into feeding tube in the morning and at bedtime. 07/25/20   Candee Furbish, MD  Multiple Vitamin (MULTIVITAMIN WITH MINERALS) TABS tablet Place 1 tablet into feeding tube daily. 07/03/20 10/01/20  Kayleen Memos, DO  Nutritional Supplements (ENSURE ACTIVE HIGH PROTEIN) LIQD Take 1 Can by mouth in the morning and at bedtime. Patient not taking: Reported on 07/31/2020 07/09/20   Barb Merino, MD  Nutritional Supplements (FEEDING SUPPLEMENT, OSMOLITE 1.2 CAL,) LIQD Place 1,000 mLs into feeding tube continuous. 07/10/20   Horald Pollen, MD  oxyCODONE-acetaminophen (PERCOCET/ROXICET) 5-325 MG tablet PLACE 1 TABLET INTO FEEDING TUBE EVERY EIGHT HOURS AS NEEDED FOR UP TO 3 DAYS FOR SEVERE PAIN. 07/02/20 12/29/20  Kayleen Memos, DO  pantoprazole sodium (PROTONIX) 40 mg/20 mL PACK Place 20 mLs (40 mg total) into feeding tube at bedtime. 08/24/20   Horald Pollen, MD  phenol (CHLORASEPTIC) 1.4 % LIQD Use as directed 1 spray in the mouth or throat as needed for throat irritation / pain. 03/08/20   Candee Furbish, MD  polyethylene glycol (MIRALAX / GLYCOLAX) 17 g packet Place 17 g into feeding tube daily as needed  for mild constipation. Patient not taking: Reported on 07/31/2020 03/08/20   Candee Furbish, MD  revefenacin [facility]) 175 MCG/3ML nebulizer solution Take 3 mLs (175 mcg total) by nebulization daily. Patient not taking: Reported on 07/31/2020 07/10/20   Candee Furbish, MD  rosuvastatin (CRESTOR) 40 MG tablet Place 1 tablet (40 mg total) into feeding tube daily. 07/10/20   Horald Pollen, MD  senna-docusate (SENOKOT-S) 8.6-50 MG tablet Place 2 tablets into feeding tube at bedtime as needed for moderate constipation. 03/08/20   Candee Furbish, MD  sodium chloride (OCEAN) 0.65 % SOLN nasal spray Place 1 spray into both nostrils as needed for congestion. 03/08/20   Candee Furbish, MD  sorbitol 70 % SOLN Place 30 mLs into feeding tube daily as needed for moderate constipation. 07/25/20   Candee Furbish, MD  temazepam (RESTORIL) 15 MG capsule Place 1 capsule (15 mg total) into  feeding tube at bedtime as needed for sleep. 07/20/20   Horald Pollen, MD  venlafaxine [facility]) 37.5 MG tablet Place 1 tablet (37.5 mg total) into feeding tube 2 (two) times daily with a meal. 07/20/20 10/18/20  Horald Pollen, MD    Physical Exam: Vitals:   08/29/20 2030 08/29/20 2100 08/29/20 2110 08/29/20 2126  BP: 115/87  (!) 123/105 (!) 90/56  Pulse: (!) 109  (!) 108 (!) 115  Resp: _0 Temp:      TempSrc:      SpO2: 100% 100% 100% 100%  Weight:      Height:       Physical Exam Vitals and nursing note reviewed.  Constitutional:      General: She is not in acute distress.    Appearance: She is ill-appearing.  HENT:     Right Ear: External ear normal.     Left Ear: External ear normal.     Mouth/Throat:     Mouth: Mucous membranes are dry.  Eyes:     Extraocular Movements: Extraocular movements intact.     Pupils: Pupils are equal, round, and reactive to light.  Cardiovascular:     Rate and Rhythm: Regular rhythm. Tachycardia present.     Pulses: Normal pulses.          Dorsalis pedis  pulses are 2+ on the right side and 2+ on the left side.       Posterior tibial pulses are 2+ on the right side and 2+ on the left side.     Heart sounds: Normal heart sounds.  Pulmonary:     Effort: Pulmonary effort is normal.     Breath sounds: Examination of the right-lower field reveals wheezing. Examination of the left-lower field reveals wheezing. Wheezing present.  Abdominal:     General: Bowel sounds are decreased. There is distension.     Palpations: Abdomen is soft. There is no mass.     Tenderness: There is abdominal tenderness. There is no guarding.     Hernia: No hernia is present.  Musculoskeletal:     Right lower leg: No edema.     Left lower leg: No edema.  Feet:     Right foot:     Skin integrity: Skin integrity normal.     Left foot:     Skin integrity: Skin integrity normal.  Neurological:     General: No focal deficit present.     Mental Status: She is alert.     Cranial Nerves: Cranial nerves are intact.     Motor: Motor function is intact.  Psychiatric:        Speech: She is noncommunicative.        Behavior: Behavior is cooperative.    Labs on Admission: I have personally reviewed following labs and imaging studies  No results for input(s): CKTOTAL, CKMB, TROPONINI in the last 72 hours. Lab Results  Component Value Date   WBC 23.7 (H) 08/29/2020   HGB 12.2 08/29/2020   HCT 36.0 08/29/2020   MCV 88.1 08/29/2020   PLT 438 (H) 08/29/2020    Recent Labs  Lab 08/29/20 1645 08/29/20 1654  NA 137 137  K 4.2 4.3  CL 96* 98  CO2 31  --   BUN 14 17  CREATININE 0.96 0.90  CALCIUM 10.2  --   PROT 8.6*  --   BILITOT 0.7  --   ALKPHOS 84  --   ALT 14  --  AST 26  --   GLUCOSE 184* 188*   Lab Results  Component Value Date   CHOL 228 (H) 01/24/2020   HDL 110 01/24/2020   LDLCALC 107 (H) 01/24/2020   TRIG 163 (H) 03/06/2020   Lab Results  Component Value Date   DDIMER 0.40 03/18/2020   Invalid input(s): POCBNP  Urinalysis    Component  Value Date/Time   COLORURINE YELLOW 08/29/2020 [place] (A) 08/29/2020 1645   LABSPEC 1.009 08/29/2020 1645   PHURINE 8.0 08/29/2020 1645   GLUCOSEU 50 (A) 08/29/2020 1645   HGBUR NEGATIVE 08/29/2020 [place] 08/29/2020 [place] 08/29/2020 1645   PROTEINUR 100 (A) 08/29/2020 1645   UROBILINOGEN 1.0 11/12/2008 1440   NITRITE NEGATIVE 08/29/2020 1645   LEUKOCYTESUR NEGATIVE 08/29/2020 1645   Lab Results  Component Value Date   SARSCOV2NAA NEGATIVE 08/29/2020   [place] NEGATIVE 07/30/2020   [place] NEGATIVE 07/21/2020   [place] NEGATIVE 07/03/2020    Radiological Exams on Admission: CT ABDOMEN PELVIS WO CONTRAST  Result Date: 08/29/2020 CLINICAL DATA:  55 year old female with abdominal pain and fever. EXAM: CT ABDOMEN AND PELVIS WITHOUT CONTRAST TECHNIQUE: Multidetector CT imaging of the abdomen and pelvis was performed following the standard protocol without IV contrast. COMPARISON:  CT abdomen pelvis dated 07/03/2020. FINDINGS: Evaluation of this exam is limited in the absence of intravenous contrast. Lower chest: Bibasilar scarring. Small chronic fluid noted along the anterior pleural space. No intra-abdominal free air.  Small free fluid in the pelvis. Hepatobiliary: Diffuse fatty liver. No intrahepatic biliary ductal dilatation. The gallbladder is unremarkable. Pancreas: Unremarkable. No pancreatic ductal dilatation or surrounding inflammatory changes. Spleen: Normal in size without focal abnormality. Adrenals/Urinary Tract: The adrenal glands unremarkable. The kidneys, visualized ureters, and urinary bladder appear unremarkable. Stomach/Bowel: The stomach is distended. Percutaneous gastrostomy with tip in the distal body of the stomach. There is no bowel obstruction or active inflammation. The appendix is normal. Vascular/Lymphatic: Mild aortoiliac atherosclerotic disease. The IVC is unremarkable. No portal venous gas. There  is no adenopathy. Reproductive: The uterus is anteverted and grossly unremarkable. No adnexal masses. Other: None Musculoskeletal: No acute or significant osseous findings. IMPRESSION: 1. Distended stomach. No bowel obstruction. Normal appendix. 2. Fatty liver. 3. Aortic Atherosclerosis (ICD10-I70.0). Electronically Signed   By: Anner Crete M.D.   On: 08/29/2020 21:10   CT Head Wo Contrast  Result Date: 08/29/2020 CLINICAL DATA:  Decreased oxygen saturation. Rule out cerebral hemorrhage. EXAM: CT HEAD WITHOUT CONTRAST TECHNIQUE: Contiguous axial images were obtained from the base of the skull through the vertex without intravenous contrast. COMPARISON:  03/19/2020 FINDINGS: Brain: Age advanced cerebral atrophy. Moderate and age advanced low density in the periventricular white matter likely related to small vessel disease. No mass lesion, hemorrhage, hydrocephalus, acute infarct, intra-axial, or extra-axial fluid collection. Vascular: Intracranial atherosclerosis. Skull: Normal Sinuses/Orbits: Normal imaged portions of the orbits and globes. Clear paranasal sinuses and mastoid air cells. Aerated right petrous apex. Other: None. IMPRESSION: 1. No acute intracranial abnormality. 2. Cerebral atrophy and small vessel ischemic change. Electronically Signed   By: Abigail Miyamoto M.D.   On: 08/29/2020 17:48   DG Chest Port 1 View  Result Date: 08/29/2020 CLINICAL DATA:  Shortness of breath EXAM: PORTABLE CHEST 1 VIEW COMPARISON:  08/01/2020, CT from 02/28/2020 FINDINGS: Tracheostomy tube is again seen and stable. Cardiac shadow is within normal limits. The lungs are hyperinflated but stable. Some scarring is noted in the right base. Small nodular  changes are noted in the right apex stable from the previous exam. No new focal infiltrate is seen. IMPRESSION: Stable nodular changes in the right apex unchanged from prior CT. Hyperinflation consistent with COPD. Electronically Signed   By: Inez Catalina M.D.   On:  08/29/2020 16:44    EKG: Independently reviewed.  None today     Assessment/Plan Principal Problem:   Severe sepsis with septic shock (CODE) (HCC) Active Problems:   Chronic respiratory failure with hypoxia (HCC)   Hypothyroidism (acquired)   Generalized abdominal pain   Seizure disorder (HCC)   Severe sepsis with septic shock: Patient met sepsis criteria from heart rate, respiratory rate, white count, presumed source of infection, as her abdomen. Pt initially responded to ivf and became hypotensive and another bolus given, bp still low. We will admit to icu incase we need vasopressor therapy. We will further admit to ICU for vent management and respiratory therapy and care. Patient to have cardiac and continuous pulse oximetry monitoring.   Blood cultures collected, source of infection unclear. We will continue empiric coverage with antibiotic therapy.   Chronic respiratory failure with hypoxia: Status post tracheostomy and chronic ventilator. Initial reports of patient becoming dyspneic and his work of breathing attributed to mucous plugging's, do not suspect pulmonary embolism patient is already on anticoagulation with Eliquis. Supportive care with nebulizer and MDIs and incentive spirometer therapy. Critical care consult.  Hypothyroidism:  Continue home medication regimen with levothyroxine 50 mcg.  Generalized abdominal pain:  CT of the abdomen without contrast due to contrast shortage is negative. Lactic acid, lipase negative. Do not suspect ischemic etiology. We will repeat a lactic acid and obtain a procalcitonin. As needed pain meds.  Seizure disorder: We will continue patient on seizure precautions.   No AEDs and home meds.    DVT prophylaxis:  Eliquis  Code Status:  Full code  Family Communication:  Reynolds,Gerald (Spouse)  [phone] (Mobile)  Disposition Plan:  To be determined  Consults called:  Critical care per EDMD.  Admission  status: Inpatient.   Para Skeans MD [facility] [phone] How to contact the [facility] Attending or Consulting provider [place] or covering provider during after hours [place], for this patient.    1. Check the care team in [facility] and look for a) attending/consulting [place] provider listed and b) the [facility] team listed 2. Log into [email or web address] and use 's universal password to access. If you do not have the password, please contact the hospital operator. 3. Locate the [facility] provider you are looking for under [facility] and page to a number that you can be directly reached. 4. If you still have difficulty reaching the provider, please page the [facility] (Director on Call) for the Hospitalists listed on amion for assistance. [email or web address] Password [facility] 08/29/2020, 9:27 PM

## 2020-08-29 NOTE — Progress Notes (Signed)
PIP on vent 51 on arrival and increased to low 60s. HR increased, pt complaining of abdominal pain. SATs stable, pt removed from vent and bag-lavaged with 20cc of saline. Moderate sized mucous plug removed, PIP now sustaining 36. RN made aware. Per husband, home health vent support came to home 10 days ago and increased PIP alarm to 70 because home vent kept alarming all day. RRT educated grandson on what the PIP alarm could mean and why it is important to not let it go that high for long periods of time. Grandson to contact home agency to lower alarm back down to 60.

## 2020-08-29 NOTE — Telephone Encounter (Signed)
Jannette Fogo called and wanted to inform PCP that patient is en route to the ED. She said that in the last 30 minutes that the patient had some breathing issues. She said all day her O2 was 98% and it dropped to 88%. Iva said she tried suction, normal saline drops, breathing treatment and upping her O2 to 4 litters. She said once the ambulance arrived they put her on 6 litters and now her O2 is a 97%. Please advise   Phone: [phone]

## 2020-08-29 NOTE — ED Triage Notes (Signed)
Pt arrives via EMS from home. Home health Rn d/t pt SpO2 dropping to 85%. Attempted to suction with no relief. Pt baseline vent 4 liters was increased to 6 liters. Pt SpO2 on arrival 95%.  220/110 HR 130 99.7 Temp

## 2020-08-29 NOTE — Telephone Encounter (Signed)
Patients family calling for third time today about medication

## 2020-08-29 NOTE — Telephone Encounter (Signed)
Patient is out of her Clonazepam medication, the spouse Shaune Spittle stated the prescription sent to the pharmacy on 08/27/2020 the pharmacy said it is too early to release. The patient took her last 1/2 tablet this morning and need for the doctor to okay a few tablets. I spoke to Weston Brass the pharmacist at [facility] and he states it is too early, the last refill was on 08/02/2020. I advised Dr Alvy Bimler the pharmacist stated it was too early and he can only approve additional until 09/01/2020. I advised the patient's spouse of what Dr Alvy Bimler he will send a few tablets until able to refill 08/27/2020 prescription.

## 2020-08-29 NOTE — Sepsis Progress Note (Signed)
Sepsis protocol is being followed by [facility]. 

## 2020-08-29 NOTE — ED Provider Notes (Signed)
[place] EMERGENCY DEPARTMENT Provider Note   CSN: [id number] Arrival date & time: 08/29/20  1549     History Chief Complaint  Patient presents with  . Respiratory Distress    Kathleen Cameron is a 55 y.o. female presenting for evaluation of altered mental status and shortness of breath.  History provided by patient's caregiver and son.  Per caregivers, patient was in a good state of health recently.  Today she had increased shortness of breath and desaturation to 85% on her home oxygen.  This did not improve with suction or breathing treatment.  Patient is normally able to talk and converse, alert and oriented x2, but is altered as of about an hour and 1.  No known fevers.  No recent cough.  She is on a blood thinner.  She is not taking her medications prescribed.  She has had issues with her blood pressure recently, she was started on amlodipine yesterday.  No nausea, vomiting, change in bowel output.  She has a Foley.  Additional history obtained from chart review.  History of COPD, chronic respiratory failure on long-term vent, history of upper GI bleed, htn, seizure disorder.   HPI     Past Medical History:  Diagnosis Date  . Acute on chronic respiratory failure with hypoxia ([place])   . Anxiety disorder   . Asthma   . COPD (chronic obstructive pulmonary disease) ([place])   . COPD, severe ([place])   . Hypertension   . Seizure disorder [facility])     Patient Active Problem List   Diagnosis Date Noted  . Aspiration pneumonia ([place])   . Goals of care, counseling/discussion   . Ileus ([place])   . Acute on chronic respiratory failure with hypoxia ([place]) 07/21/2020  . Acute on chronic respiratory failure with hypercapnia ([place]) 07/03/2020  . Tracheostomy status ([place])   . Sepsis ([place]) 06/03/2020  . Healthcare-associated pneumonia 06/03/2020  . Pressure injury of skin 06/03/2020  . Acute respiratory failure ([place])   . Seizure disorder ([place])   . COPD, severe ([place])   .  Anxiety disorder   . Generalized abdominal pain   . Convulsions ([place])   . Malnutrition of moderate degree 02/27/2020  . Hypotension   . Acute hypercapnic respiratory failure ([place]) 02/25/2020  . Right upper quadrant abdominal pain 02/01/2020  . Hypothyroidism (acquired) 02/01/2020  . Acute metabolic encephalopathy 56/38/9373  . Hyponatremia 01/28/2020  . Anxiety 12/02/2019  . COPD exacerbation ([place]) 11/06/2019  . Depression 11/06/2019  . Acute on chronic respiratory failure with hypoxia and hypercapnia ([place]) 11/06/2019  . Current moderate episode of major depressive disorder without prior episode ([place]) 09/20/2019  . Pedal edema 08/04/2019  . Hypokalemia 04/13/2019  . Chronic respiratory failure with hypoxia ([place]) 03/07/2019  . CKD (chronic kidney disease) 01/05/2019  . COPD (chronic obstructive pulmonary disease) ([place]) 09/29/2018  . Pulmonary nodules 09/27/2018  . Dyslipidemia 07/07/2018  . Essential hypertension 03/10/2018    Past Surgical History:  Procedure Laterality Date  . COLONOSCOPY N/A 06/26/2020   Procedure: COLONOSCOPY;  Surgeon: Carol Ada, MD;  Location: [place];  Service: Endoscopy;  Laterality: N/A;  . ENTEROSCOPY N/A 06/26/2020   Procedure: ENTEROSCOPY;  Surgeon: Carol Ada, MD;  Location: [place];  Service: Endoscopy;  Laterality: N/A;  . HEMOSTASIS CLIP PLACEMENT  06/26/2020   Procedure: HEMOSTASIS CLIP PLACEMENT;  Surgeon: Carol Ada, MD;  Location: [place];  Service: Endoscopy;;  . HOT HEMOSTASIS N/A 06/26/2020   Procedure: HOT HEMOSTASIS (ARGON PLASMA COAGULATION/BICAP);  Surgeon:  Carol Ada, MD;  Location: [place];  Service: Endoscopy;  Laterality: N/A;  . IR FLUORO RM 30-60 MIN  03/23/2020  . LAPAROSCOPIC INSERTION GASTROSTOMY TUBE N/A 04/24/2020   Procedure: LAPAROSCOPIC GASTROSTOMY TUBE PLACEMENT;  Surgeon: Kieth Brightly Arta Bruce, MD;  Location: [place];  Service: General;  Laterality: N/A;  . POLYPECTOMY  06/26/2020   Procedure:  POLYPECTOMY;  Surgeon: Carol Ada, MD;  Location: [facility] ENDOSCOPY;  Service: Endoscopy;;  . WISDOM TOOTH EXTRACTION       OB History   No obstetric history on file.     Family History  Family history unknown: Yes    Social History   Tobacco Use  . Smoking status: Never Smoker  . Smokeless tobacco: Never Used  Vaping Use  . Vaping Use: Never used  Substance Use Topics  . Alcohol use: Yes    Alcohol/week: 2.0 standard drinks    Types: 2 Cans of beer per week  . Drug use: Never    Home Medications Prior to Admission medications   Medication Sig Start Date End Date Taking? Authorizing Provider  amLODipine (NORVASC) 5 MG tablet Take 1 tablet (5 mg total) by mouth daily. 08/24/20   Horald Pollen, MD  apixaban (ELIQUIS) 5 MG TABS tablet Place 1 tablet (5 mg total) into feeding tube 2 (two) times daily. 08/20/20 12/18/20  Horald Pollen, MD  arformoterol (BROVANA) 15 MCG/2ML NEBU Take 2 mLs (15 mcg total) by nebulization 2 (two) times daily. Patient not taking: Reported on 07/31/2020 07/10/20   Candee Furbish, MD  bisacodyl (DULCOLAX) 10 MG suppository Place 1 suppository (10 mg total) rectally daily as needed for severe constipation. 07/10/20   Horald Pollen, MD  budesonide (PULMICORT) 0.25 MG/2ML nebulizer solution Take 2 mLs (0.25 mg total) by nebulization 2 (two) times daily. 07/10/20   Candee Furbish, MD  Chlorhexidine Gluconate Cloth 2 % PADS Apply 6 each topically daily. 08/21/20   Horald Pollen, MD  chlorhexidine gluconate, MEDLINE KIT, (PERIDEX) 0.12 % solution 15 mLs by Mouth Rinse route 2 (two) times daily. 07/10/20   Candee Furbish, MD  clonazePAM (KLONOPIN) 1 MG tablet Place 1/2 tablet (0.5 mg total) into feeding tube 3 (three) times daily as needed for anxiety. 08/29/20   Horald Pollen, MD  ipratropium-albuterol (DUONEB) 0.5-2.5 (3) MG/3ML SOLN Take 3 mLs by nebulization every 4 (four) hours. 07/22/20   [provider]  levothyroxine  (SYNTHROID) 50 MCG tablet Take 1 tablet (50 mcg total) by mouth daily at 6 (six) AM. 08/17/20   Sagardia, Ines Bloomer, MD  lip balm (CARMEX) ointment Apply topically as needed for lip care. 03/08/20   Candee Furbish, MD  metoCLOPramide (REGLAN) 5 MG/5ML solution Place 10 mLs (10 mg total) into feeding tube in the morning and at bedtime. 07/25/20   Candee Furbish, MD  Multiple Vitamin (MULTIVITAMIN WITH MINERALS) TABS tablet Place 1 tablet into feeding tube daily. 07/03/20 10/01/20  Kayleen Memos, DO  Nutritional Supplements (ENSURE ACTIVE HIGH PROTEIN) LIQD Take 1 Can by mouth in the morning and at bedtime. Patient not taking: Reported on 07/31/2020 07/09/20   Barb Merino, MD  Nutritional Supplements (FEEDING SUPPLEMENT, OSMOLITE 1.2 CAL,) LIQD Place 1,000 mLs into feeding tube continuous. 07/10/20   Horald Pollen, MD  oxyCODONE-acetaminophen (PERCOCET/ROXICET) 5-325 MG tablet PLACE 1 TABLET INTO FEEDING TUBE EVERY EIGHT HOURS AS NEEDED FOR UP TO 3 DAYS FOR SEVERE PAIN. 07/02/20 12/29/20  Kayleen Memos, DO  pantoprazole  sodium (PROTONIX) 40 mg/20 mL PACK Place 20 mLs (40 mg total) into feeding tube at bedtime. 08/24/20   Horald Pollen, MD  phenol (CHLORASEPTIC) 1.4 % LIQD Use as directed 1 spray in the mouth or throat as needed for throat irritation / pain. 03/08/20   Candee Furbish, MD  polyethylene glycol (MIRALAX / GLYCOLAX) 17 g packet Place 17 g into feeding tube daily as needed for mild constipation. Patient not taking: Reported on 07/31/2020 03/08/20   Candee Furbish, MD  revefenacin [facility]) 175 MCG/3ML nebulizer solution Take 3 mLs (175 mcg total) by nebulization daily. Patient not taking: Reported on 07/31/2020 07/10/20   Candee Furbish, MD  rosuvastatin (CRESTOR) 40 MG tablet Place 1 tablet (40 mg total) into feeding tube daily. 07/10/20   Horald Pollen, MD  senna-docusate (SENOKOT-S) 8.6-50 MG tablet Place 2 tablets into feeding tube at bedtime as needed for moderate  constipation. 03/08/20   Candee Furbish, MD  sodium chloride (OCEAN) 0.65 % SOLN nasal spray Place 1 spray into both nostrils as needed for congestion. 03/08/20   Candee Furbish, MD  sorbitol 70 % SOLN Place 30 mLs into feeding tube daily as needed for moderate constipation. 07/25/20   Candee Furbish, MD  temazepam (RESTORIL) 15 MG capsule Place 1 capsule (15 mg total) into feeding tube at bedtime as needed for sleep. 07/20/20   Horald Pollen, MD  venlafaxine [facility]) 37.5 MG tablet Place 1 tablet (37.5 mg total) into feeding tube 2 (two) times daily with a meal. 07/20/20 10/18/20  Sagardia, Ines Bloomer, MD    Allergies    Stiolto respimat [tiotropium bromide-olodaterol]  Review of Systems   Review of Systems  Unable to perform ROS: Acuity of condition  Respiratory: Positive for shortness of breath.   Psychiatric/Behavioral: Positive for confusion.    Physical Exam Updated Vital Signs BP (!) 184/127 (BP Location: Right Arm)   Pulse (!) 147   Temp 98.6 F (37 C) (Oral)   Resp (!) 24   Ht _0  (1.651 m)   Wt 59 kg   SpO2 95%   BMI 21.63 kg/m   Physical Exam Vitals and nursing note reviewed.  Constitutional:      General: She is in acute distress.     Appearance: She is ill-appearing.     Comments: chronically ill appearing. In distress  HENT:     Head: Normocephalic and atraumatic.  Eyes:     Pupils: Pupils are equal, round, and reactive to light.     Comments: Pupils ~64m bilaterally  Cardiovascular:     Rate and Rhythm: Regular rhythm. Tachycardia present.     Pulses: Normal pulses.     Comments: Tachycardic around 150 Pulmonary:     Effort: Tachypnea and respiratory distress present.     Breath sounds: Rhonchi present.     Comments: On vent/trach. Tachypneic. Crackles in lower lobes. SpO2 mid 90's on 6L via vent Abdominal:     General: There is no distension.     Palpations: Abdomen is soft. There is no mass.     Tenderness: There is no abdominal tenderness.  There is no guarding or rebound.  Musculoskeletal:     Cervical back: Normal range of motion and neck supple.     Comments: Grip strength equal bilaterally.    Skin:    General: Skin is warm and dry.     Capillary Refill: Capillary refill takes less than 2 seconds.  Neurological:  GCS: GCS eye subscore is 4. GCS verbal subscore is 1. GCS motor subscore is 6.     Comments: GCS 11. follows simple commands. No verbal response     ED Results / Procedures / Treatments   Labs (all labs ordered are listed, but only abnormal results are displayed) Labs Reviewed  URINE CULTURE  CULTURE, BLOOD (ROUTINE X 2)  CULTURE, BLOOD (ROUTINE X 2)  LACTIC ACID, PLASMA  LACTIC ACID, PLASMA  COMPREHENSIVE METABOLIC PANEL  CBC WITH DIFFERENTIAL/PLATELET  PROTIME-INR  APTT  URINALYSIS, ROUTINE W REFLEX MICROSCOPIC  LIPASE, BLOOD  I-STAT CHEM 8, ED    EKG None  Radiology No results found.  Procedures .Critical Care Performed by: Franchot Heidelberg, PA-C Authorized by: Franchot Heidelberg, PA-C   Critical care provider statement:    Critical care time (minutes):  50   Critical care time was exclusive of:  Separately billable procedures and treating other patients and teaching time   Critical care was necessary to treat or prevent imminent or life-threatening deterioration of the following conditions:  Sepsis and respiratory failure   Critical care was time spent personally by me on the following activities:  Blood draw for specimens, evaluation of patient's response to treatment, development of treatment plan with patient or surrogate, examination of patient, obtaining history from patient or surrogate, ordering and performing treatments and interventions, ordering and review of laboratory studies, ordering and review of radiographic studies, pulse oximetry, re-evaluation of patient's condition and review of old charts   I assumed direction of critical care for this patient from another provider  in my specialty: no     Care discussed with: admitting provider   Comments:     Patient presenting altered and with signs of respiratory distress.  Vitals concerning for sepsis, broad-spectrum antibiotics started.  Patient admitted for sepsis and further management     Medications Ordered in ED Medications - No data to display  ED Course  I have reviewed the triage vital signs and the nursing notes.  Pertinent labs & imaging results that were available during my care of the patient were reviewed by me and considered in my medical decision making (see chart for details).    MDM Rules/Calculators/A&P                          Patient presented for evaluation of hypoxia, increased work of breathing, and altered mental status.  Limited history due to patient's nonverbal status upon my initial evaluation.  Per caregiver, patient had change in mental status today and worsening respiratory status today.  On exam, patient appears ill.  She is tachycardic, tachypneic, feels warm to the touch.  Blood pressure is initially extremely elevated with a systolic over 859.  Concern for sepsis, especially respiratory cause as patient is on a vent.  Less likely PE as patient is on a blood thinner and is febrile.  Also consider intracranial bleed due to hypertension.  Consider stroke.  Will obtain labs, chest x-ray, urine, CT head.  Patient was found to be febrile with a white count of 23.  Code sepsis called and broad-spectrum antibiotics started as source is unknown.  Urine without obvious infection.  Labs otherwise reassuring.  CT head negative for acute findings.  Patient is reporting abdominal pain, which may be chronic.  However in the setting of unknown cause of sepsis, CT abdomen pelvis without ordered. Pt will need to be admitted for sepsis and further management.  Sepsis reassessment performed, pt's HR is improved.   Discussed with Dr. Posey Pronto from [facility] hospitalist service, patient to be  admitted.  Pt noted to be hypotensive. Will give 30 cc/kg bolus of LR.    BP improving with fluids. Will continue plan for admission.   Final Clinical Impression(s) / ED Diagnoses Final diagnoses:  Sepsis, due to unspecified organism, unspecified whether acute organ dysfunction present [facility])    Rx / DC Orders ED Discharge Orders    None       Franchot Heidelberg, PA-C 08/29/20 2006    Tegeler, Gwenyth Allegra, MD 08/29/20 2345

## 2020-08-29 NOTE — ED Notes (Signed)
Unable to obtain second set of blood cultures before starting antibiotic'

## 2020-08-30 ENCOUNTER — Telehealth: Payer: Self-pay | Admitting: Emergency Medicine

## 2020-08-30 DIAGNOSIS — R6521 Severe sepsis with septic shock: Secondary | ICD-10-CM | POA: Diagnosis not present

## 2020-08-30 DIAGNOSIS — R1084 Generalized abdominal pain: Secondary | ICD-10-CM | POA: Diagnosis not present

## 2020-08-30 DIAGNOSIS — J9611 Chronic respiratory failure with hypoxia: Secondary | ICD-10-CM | POA: Diagnosis not present

## 2020-08-30 DIAGNOSIS — L89159 Pressure ulcer of sacral region, unspecified stage: Secondary | ICD-10-CM

## 2020-08-30 DIAGNOSIS — A419 Sepsis, unspecified organism: Secondary | ICD-10-CM | POA: Diagnosis not present

## 2020-08-30 LAB — BLOOD CULTURE ID PANEL (REFLEXED) - BCID2

## 2020-08-30 LAB — BASIC METABOLIC PANEL
Anion gap: 10 (ref 5–15)
BUN: 19 mg/dL (ref 6–20)
CO2: 30 mmol/L (ref 22–32)
Calcium: 10 mg/dL (ref 8.9–10.3)
Chloride: 97 mmol/L — ABNORMAL LOW (ref 98–111)
Creatinine, Ser: 1.13 mg/dL — ABNORMAL HIGH (ref 0.44–1.00)
GFR, Estimated: 57 mL/min — ABNORMAL LOW (ref >60)
Glucose, Bld: 114 mg/dL — ABNORMAL HIGH (ref 70–99)
Potassium: 4 mmol/L (ref 3.5–5.1)
Sodium: 137 mmol/L (ref 135–145)

## 2020-08-30 LAB — CBC WITH DIFFERENTIAL/PLATELET
Abs Immature Granulocytes: 0.08 10*3/uL — ABNORMAL HIGH (ref 0.00–0.07)
Basophils Absolute: 0.1 10*3/uL (ref 0.0–0.1)
Basophils Relative: 1 %
Eosinophils Absolute: 0.1 10*3/uL (ref 0.0–0.5)
Eosinophils Relative: 1 %
HCT: 33.1 % — ABNORMAL LOW (ref 36.0–46.0)
Hemoglobin: 9.8 g/dL — ABNORMAL LOW (ref 12.0–15.0)
Immature Granulocytes: 1 %
Lymphocytes Relative: 8 %
Lymphs Abs: 1.2 10*3/uL (ref 0.7–4.0)
MCH: 25.9 pg — ABNORMAL LOW (ref 26.0–34.0)
MCHC: 29.6 g/dL — ABNORMAL LOW (ref 30.0–36.0)
MCV: 87.3 fL (ref 80.0–100.0)
Monocytes Absolute: 1.1 10*3/uL — ABNORMAL HIGH (ref 0.1–1.0)
Monocytes Relative: 7 %
Neutro Abs: 12.5 10*3/uL — ABNORMAL HIGH (ref 1.7–7.7)
Neutrophils Relative %: 82 %
Platelets: 363 10*3/uL (ref 150–400)
RBC: 3.79 MIL/uL — ABNORMAL LOW (ref 3.87–5.11)
RDW: 15.6 % — ABNORMAL HIGH (ref 11.5–15.5)
WBC: 15 10*3/uL — ABNORMAL HIGH (ref 4.0–10.5)
nRBC: 0 % (ref 0.0–0.2)

## 2020-08-30 LAB — GLUCOSE, CAPILLARY
Glucose-Capillary: 103 mg/dL — ABNORMAL HIGH (ref 70–99)
Glucose-Capillary: 103 mg/dL — ABNORMAL HIGH (ref 70–99)
Glucose-Capillary: 108 mg/dL — ABNORMAL HIGH (ref 70–99)
Glucose-Capillary: 117 mg/dL — ABNORMAL HIGH (ref 70–99)
Glucose-Capillary: 78 mg/dL (ref 70–99)
Glucose-Capillary: 96 mg/dL (ref 70–99)

## 2020-08-30 LAB — MRSA PCR SCREENING: MRSA by PCR: NEGATIVE

## 2020-08-30 LAB — PHOSPHORUS: Phosphorus: 6.1 mg/dL — ABNORMAL HIGH (ref 2.5–4.6)

## 2020-08-30 LAB — MAGNESIUM: Magnesium: 1.7 mg/dL (ref 1.7–2.4)

## 2020-08-30 MED ORDER — VANCOMYCIN HCL 750 MG/150ML IV SOLN
750.0000 mg | INTRAVENOUS | Status: DC
  Administered 2020-08-30 – 2020-08-31 (×2): 750 mg via INTRAVENOUS
  Filled 2020-08-30 (×3): qty 150

## 2020-08-30 MED ORDER — OSMOLITE 1.5 CAL PO LIQD
237.0000 mL | Freq: Every day | ORAL | Status: DC
  Administered 2020-08-30 – 2020-08-31 (×4): 237 mL
  Filled 2020-08-30 (×11): qty 237

## 2020-08-30 MED ORDER — SIMETHICONE 40 MG/0.6ML PO SUSP
40.0000 mg | Freq: Four times a day (QID) | ORAL | Status: DC
  Administered 2020-08-30 – 2020-09-18 (×65): 40 mg
  Filled 2020-08-30 (×78): qty 0.6

## 2020-08-30 MED ORDER — ADULT MULTIVITAMIN W/MINERALS CH
1.0000 | ORAL_TABLET | Freq: Every day | ORAL | Status: DC
  Administered 2020-08-30 – 2020-09-18 (×17): 1
  Filled 2020-08-30 (×17): qty 1

## 2020-08-30 MED ORDER — PROSOURCE TF PO LIQD
45.0000 mL | Freq: Two times a day (BID) | ORAL | Status: DC
  Administered 2020-08-30 – 2020-09-02 (×4): 45 mL
  Filled 2020-08-30 (×6): qty 45

## 2020-08-30 MED ORDER — CHLORHEXIDINE GLUCONATE 0.12% ORAL RINSE (MEDLINE KIT)
15.0000 mL | Freq: Two times a day (BID) | OROMUCOSAL | Status: DC
  Administered 2020-08-30 – 2020-09-18 (×38): 15 mL via OROMUCOSAL

## 2020-08-30 MED ORDER — ORAL CARE MOUTH RINSE
15.0000 mL | OROMUCOSAL | Status: DC
  Administered 2020-08-30 – 2020-09-18 (×172): 15 mL via OROMUCOSAL

## 2020-08-30 MED ORDER — DOCUSATE SODIUM 50 MG/5ML PO LIQD
100.0000 mg | Freq: Two times a day (BID) | ORAL | Status: DC | PRN
  Administered 2020-09-12: 100 mg
  Filled 2020-08-30 (×2): qty 10

## 2020-08-30 MED ORDER — JUVEN PO PACK
1.0000 | PACK | Freq: Two times a day (BID) | ORAL | Status: DC
  Administered 2020-08-31 – 2020-09-06 (×7): 1
  Filled 2020-08-30 (×8): qty 1

## 2020-08-30 MED ORDER — ACETAMINOPHEN 325 MG PO TABS
650.0000 mg | ORAL_TABLET | Freq: Four times a day (QID) | ORAL | Status: DC | PRN
  Administered 2020-08-30 – 2020-09-11 (×2): 650 mg
  Filled 2020-08-30 (×2): qty 2

## 2020-08-30 MED ORDER — LEVOTHYROXINE SODIUM 25 MCG PO TABS
50.0000 ug | ORAL_TABLET | Freq: Every day | ORAL | Status: DC
  Administered 2020-08-31 – 2020-09-04 (×5): 50 ug
  Filled 2020-08-30 (×5): qty 2

## 2020-08-30 MED ORDER — PANTOPRAZOLE SODIUM 40 MG PO PACK
40.0000 mg | PACK | Freq: Every day | ORAL | Status: DC
  Administered 2020-08-30 – 2020-09-18 (×17): 40 mg
  Filled 2020-08-30 (×20): qty 20

## 2020-08-30 MED ORDER — SODIUM CHLORIDE 0.9 % IV SOLN: INTRAVENOUS | Status: DC | PRN

## 2020-08-30 MED ORDER — FREE WATER
180.0000 mL | Freq: Every day | Status: DC
  Administered 2020-08-30 – 2020-09-06 (×8): 180 mL

## 2020-08-30 MED ORDER — ALPRAZOLAM 0.5 MG PO TABS
0.2500 mg | ORAL_TABLET | Freq: Three times a day (TID) | ORAL | Status: DC | PRN
  Administered 2020-08-30: 0.25 mg via ORAL
  Filled 2020-08-30: qty 1

## 2020-08-30 MED ORDER — ALPRAZOLAM 0.5 MG PO TABS: 0.2500 mg | ORAL_TABLET | Freq: Three times a day (TID) | ORAL | Status: AC | PRN

## 2020-08-30 MED ORDER — VANCOMYCIN HCL 1000 MG/200ML IV SOLN: 1000.0000 mg | INTRAVENOUS | Status: DC

## 2020-08-30 NOTE — Progress Notes (Addendum)
Pharmacy Antibiotic Note  Kathleen Cameron is a 55 y.o. female admitted on 08/29/2020 with AMS and SOB concerning for infection of unknown source.  Pharmacy has been consulted for Zosyn/vancomycin dosing - day #2. SCr up a bit to 1.13 today.  Plan: Zosyn 3.375g IV q8h (4h infusion) Adjust vancomycin to 750mg  IV q24h. Goal AUC 400-550. Expected AUC: 488 SCr used: 1.13 Monitor clinical progress, c/s, renal function F/u de-escalation plan/LOT, vancomycin levels as indicated   Height: 5\' 1"  (154.9 cm) Weight: 55 kg (121 lb 4.1 oz) IBW/kg (Calculated) : 47.8  Temp (24hrs), Avg:98.4 F (36.9 C), Min:97.6 F (36.4 C), Max:100.6 F (38.1 C)  Recent Labs  Lab 08/29/20 1645 08/29/20 1654  WBC 23.7*  --   CREATININE 0.96 0.90  LATICACIDVEN 0.9  --     Estimated Creatinine Clearance: 52 mL/min (by C-G formula based on SCr of 0.9 mg/dL).    Allergies  Allergen Reactions  . Stiolto Respimat [Tiotropium Bromide-Olodaterol] Other (See Comments)    Severe headaches and rapid heartrate    Antimicrobials this admission: 5/11 cefepime x 1 5/11 flagyl x 1 5/11 vanc >> 5/11 zosyn >>  Microbiology results: 5/11 mrsa pcr - neg 5/11 BCx - ngtd 5/11 UC - 5/12 BCx -    7/11, PharmD, BCPS Please check AMION for all [facility] contact numbers Clinical Pharmacist 08/30/2020 8:49 AM

## 2020-08-30 NOTE — Progress Notes (Signed)
NAME:  Kathleen Cameron, MRN:  [id number], DOB:  [birth year], LOS: 1 ADMISSION DATE:  08/29/2020, CONSULTATION DATE:  08/29/2020 REFERRING MD:  Dr. Allena Katz CHIEF COMPLAINT:  SOB  History of Present Illness:  55 year old female with PMHx significant for seizures, GAD, HTN, CKD and COPD c/b chronic respiratory failure (vent-dependent since 02/2020) with tracheostomy who presented on 5/11 to the [facility] ED with SOB, increased WOB. Desaturated to 85% on home vent.  Multiple recent admissions for mucus plugging (resulting in respiratory arrest), Corynebacterium straitum PNA and hypoxia. Followed by [name], ACNP in trach clinic. Recent cuffed 6.0 trach replacement 4/27.  On presentation to ED, concern for sepsis with AMS. Tmax 38.1, WBC 23.7. Fluid resuscitation started; cultures obtained. Empiric Vanc/Zosyn and Flagyl initiated. CXR demonstrated hyperinflation c/w COPD, no infiltrates. Head CT NAICA, CT A/P demonstrated distended stomach without obstruction and fatty liver. Per RT documentation, several mucous plugs removed after bag lavage in ED.  PCCM is consulted for ventilator management.  Pertinent Medical History:   Past Medical History:  Diagnosis Date  . Acute on chronic respiratory failure with hypoxia (HCC)   . Anxiety disorder   . Asthma   . COPD (chronic obstructive pulmonary disease) ([facility])   . COPD, severe ([facility])   . Hypertension   . Seizure disorder ([facility])    Significant Hospital Events: Including procedures, antibiotic start and stop dates in addition to other pertinent events   . 5/11 - Increasing SOB, presented to [facility] ED, code sepsis called, Pip-tazo>, Vanc> flagyl x1, head ct neg, CT abd pelvis grossly unremarkable, UC> , BC> . 5/12 - Anxious overnight, improved with Xanax.   Interim History / Subjective:  Very anxious overnight requiring Xanax VT Remains somewhat somnolent, wakes with voice Reports improvement in abdominal distention Breathing feels better  Objective    Blood pressure 136/81, pulse 98, temperature 97.6 F (36.4 C), temperature source Axillary, resp. rate 17, height 5\' 1"  (1.549 m), weight 55 kg, SpO2 92 %.    Vent Mode: PSV;CPAP FiO2 (%):  [40 %-50 %] 40 % Set Rate:  [16 bmp-20 bmp] 16 bmp Vt Set:  [420 mL-450 mL] 420 mL PEEP:  [5 cmH20] 5 cmH20 Pressure Support:  [10 cmH20] 10 cmH20 Plateau Pressure:  [24 cmH20-45 cmH20] 24 cmH20   Intake/Output Summary (Last 24 hours) at 08/30/2020 0824 Last data filed at 08/30/2020 0600 Gross per 24 hour  Intake 468.29 ml  Output 500 ml  Net -31.71 ml   Filed Weights   08/29/20 1609 08/29/20 2200 08/30/20 0451  Weight: 59 kg 55 kg 55 kg    Physical Examination: General: Chronically ill-appearing female in NAD. HEENT: [place]/AT, anicteric sclera, PERRL, moist mucous membranes. Neuro: Drowsy, wakes easily to voice. Oriented x 4. Responds to verbal stimuli. Following commands consistently.  CV: RRR, no m/g/r. PULM: Breathing even and mildly labored with +abdominal accessory muscle use on vent (PS, 10/5, FiO2 40%). Lung fields CTAB anteriorly, no significant crackles/wheeze/rhonchi. GI: Soft, nontender, mildly distended. Slightly hypoactive bowel sounds. GT site with scant drainage on gauze, no erythema. Extremities: No LE edema noted. Skin: Warm/dry, no rashes noted.  Labs/imaging that I have personally reviewed: (right click and "Reselect all SmartList Selections" daily)  5/12 Labs pending  5/11: WBC 23.7 H&H 11.0/37.9 Plt 438 INR 1.4 (Eliquis)  Na 137, K 4.2, CO2 31, BUN 14, Cr 0.96 Glucoses   LA 0.9 UA glucose 50, Protein 100, negative Hgb/bili/ketones/nitrite/leuks UCx/BCx pending  CXR hyperinflation, no infiltrates  CT Head NAICA  CT  A/P gastric distention, fatty liver, atherosclerosis, otherwise unremarkable  Resolved Hospital Problem list     Assessment & Plan:  Chronic Respiratory Failure with Tracheostomy requiring Mechanical Ventilation On MV at home since 02/2020.  Multiple admissions to [facility] over the past 4 months for SOB, mucus plugging, corynebacterium PNA with respiratory arrest. This encounter, desaturated to 85% at home; unimproved with suctioning or nebulizer treatments. There has been some question of compliance with home medication regimen. Home vent settings VC PEEP 5, RT 16, TV 480.  - Continue vent support, currently tolerating pressure support - Settings as follows: PS 10/5, FiO2 40% - Wean FiO2 for O2 sat > 90% - Continue with [place] 6.0 Cuffed trach - Aggressive pulmonary hygiene with frequent gentle suctioning - VAP bundle - Continue saline nebs, DuoNebs - Continue Pulmicot, Roxy Manns - Ongoing outpatient trach clinic f/u  Remainder per primary team; PCCM will continue to follow for respiratory concerns/vent management.  Best practice (right click and "Reselect all SmartList Selections" daily)  Diet:  NPO Pain/Anxiety/Delirium protocol (if indicated): No VAP protocol (if indicated): Yes DVT prophylaxis: Systemic AC on Eliquis GI prophylaxis: PPI Glucose control:  SSI No Central venous access:  N/A Arterial line:  N/A Foley:  N/A Mobility:  bed rest  PT consulted: N/A Last date of multidisciplinary goals of care discussion [Per primary] Code Status:  full code Disposition: ICU  Critical care time: N/A   Faythe Ghee [place] [facility] 08/30/20 8:25 AM  Please see [email or web address] for pager details.  From 7A-7P if no response, please call [phone] After hours, please call E-Link [phone]

## 2020-08-30 NOTE — Progress Notes (Signed)
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  Apparently had an episode of desaturation at 85% on home vent.  She has had multiple recent hospitalizations for mucous plugging event resulting in respiratory arrest, followed by pulmonology as an outpatient in the trach clinic.  Recent cuffed 6.0 trach replacement 4/27.  In the ED she was febrile 38.1 WBC 23.7, concern for sepsis.  She was started on Vanco and Zosyn.  In the ED RT removed several mucous plugs  Subjective / 24h Interval events: Remains drowsy this morning, but wakes up and tracks me with her eyes.  Does not answer my questions.  Assessment & Plan: Principal Problem Chronic respiratory failure with tracheostomy, chronic mechanical ventilation, severe sepsis with likely pulmonary origin -patient was placed on vancomycin and Zosyn, continue.  Cultures are pending.  Continue vent support per PCCM -WBC improving this morning  Active Problems COPD with recurrent mucous plugging-no wheezing this morning, continue as needed nebs, hypertonic saline  Hypothyroidism-continue Synthroid  Generalized abdominal pain-CT scan of the abdomen pelvis negative for acute findings.  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 apixaban  5 mg Per Tube BID  . arformoterol  15 mcg Nebulization BID  . budesonide  0.25 mg Nebulization BID  . Chlorhexidine Gluconate Cloth  6 each Topical Daily  . clonazePAM  0.5 mg Per Tube TID  .  ipratropium-albuterol  3 mL Nebulization Q4H  . lactulose  10 g Per Tube Daily  . [START ON 08/31/2020] levothyroxine  50 mcg Per Tube Q0600  . pantoprazole sodium  40 mg Per Tube Daily  . revefenacin  175 mcg Nebulization Daily  . rosuvastatin  40 mg Per Tube Daily  . simethicone  40 mg Per Tube Q6H  . sodium chloride HYPERTONIC  4 mL Nebulization Daily  . venlafaxine  37.5 mg Per Tube BID WC   Continuous Infusions: . sodium chloride 250 mL (08/29/20 2224)  . lactated ringers    . lactated ringers    . piperacillin-tazobactam (ZOSYN)  IV 12.5 mL/hr at 08/30/20 0900  . sodium chloride    . vancomycin     PRN Meds:.ALPRAZolam, bisacodyl, docusate, HYDROmorphone (DILAUDID) injection, ipratropium-albuterol, metoCLOPramide, ondansetron (ZOFRAN) IV, phenol, polyethylene glycol, temazepam  Diet Orders (From admission, onward)    Start     Ordered   08/29/20 1617  Diet NPO time specified  (Undifferentiated presentation (screening labs and basic nursing orders))  Diet effective now        08/29/20 1616          DVT prophylaxis: SCDs Start: 08/29/20 2024 apixaban (ELIQUIS) tablet 5 mg     Code Status: Full Code  Family Communication: no family at bedside   Status is: Inpatient  Remains inpatient appropriate because:Inpatient level of care appropriate due to severity of illness   Dispo: The patient is from: Home              Anticipated d/c is to: Home              Patient currently is  not medically stable to d/c.   Difficult to place patient No  Level of care: ICU  Consultants:  PCCM  Procedures:  none  Microbiology  Cultures 5/11 - pending  Antimicrobials: Vancomycin / Zosyn 5/11 >>    Objective: Vitals:   08/30/20 0733 08/30/20 0738 08/30/20 0800 08/30/20 0830  BP:  136/81 (!) 156/86 (!) 156/99  Pulse:  98 98 (!) 101  Resp:  17 16 16   Temp: 97.6 F (36.4 C)     TempSrc: Axillary     SpO2:  92% (!) 87% 92%  Weight:      Height:        Intake/Output  Summary (Last 24 hours) at 08/30/2020 0937 Last data filed at 08/30/2020 0900 Gross per 24 hour  Intake 645.43 ml  Output 500 ml  Net 145.43 ml   Filed Weights   08/29/20 1609 08/29/20 2200 08/30/20 0451  Weight: 59 kg 55 kg 55 kg    Examination:  Constitutional: NAD Eyes: no scleral icterus ENMT: Mucous membranes are moist.  Neck: normal, supple Respiratory: Diminished at the bases but overall clear to auscultation bilaterally, no wheezing, no crackles. Normal respiratory effort.  Breathing with the vent Cardiovascular: Regular rate and rhythm, no murmurs / rubs / gallops. No LE edema.  Abdomen: non distended, no tenderness. Bowel sounds positive.  Musculoskeletal: no clubbing / cyanosis.  Skin: no rashes Neurologic: Does not follow commands but appears to move all 4 extremities independently  Data Reviewed: I have independently reviewed following labs and imaging studies   CBC: Recent Labs  Lab 08/29/20 1645 08/29/20 1654 08/29/20 2230 08/30/20 0901  WBC 23.7*  --   --  15.0*  NEUTROABS 20.0*  --   --  12.5*  HGB 11.0* 12.2 10.5* 9.8*  HCT 37.9 36.0 31.0* 33.1*  MCV 88.1  --   --  87.3  PLT 438*  --   --  363   Basic Metabolic Panel: Recent Labs  Lab 08/29/20 1645 08/29/20 1654 08/29/20 2230  NA 137 137 137  K 4.2 4.3 4.2  CL 96* 98  --   CO2 31  --   --   GLUCOSE 184* 188*  --   BUN 14 17  --   CREATININE 0.96 0.90  --   CALCIUM 10.2  --   --    Liver Function Tests: Recent Labs  Lab 08/29/20 1645  AST 26  ALT 14  ALKPHOS 84  BILITOT 0.7  PROT 8.6*  ALBUMIN 3.1*   Coagulation Profile: Recent Labs  Lab 08/29/20 1645  INR 1.4*   HbA1C: No results for input(s): HGBA1C in the last 72 hours. CBG: Recent Labs  Lab 08/29/20 2113 08/30/20 0502 08/30/20 0712  GLUCAP 173* 108* 103*    Recent Results (from the past 240 hour(s))  MRSA PCR Screening     Status: None   Collection Time: 08/29/20  4:55 AM   Specimen: Nasal Mucosa; Nasopharyngeal   Result Value Ref Range Status   MRSA by PCR NEGATIVE NEGATIVE Final    Comment:        The GeneXpert MRSA Assay (FDA approved for NASAL specimens only), is one component of a comprehensive MRSA colonization surveillance program. It is not intended to diagnose MRSA infection nor to guide or monitor treatment for MRSA infections. Performed at [facility], [address] [place], [place]   Blood Culture (routine x 2)     Status: None (Preliminary result)  Collection Time: 08/29/20  4:45 PM   Specimen: BLOOD  Result Value Ref Range Status   Specimen Description BLOOD LEFT ANTECUBITAL  Final   Special Requests   Final    BOTTLES DRAWN AEROBIC AND ANAEROBIC Blood Culture adequate volume   Culture   Final    NO GROWTH < 24 HOURS Performed at [facility], [address] [place], [place] [address]    Report Status PENDING  Incomplete  Resp Panel by RT-PCR (Flu A&B, Covid) Nasopharyngeal Swab     Status: None   Collection Time: 08/29/20  5:03 PM   Specimen: Nasopharyngeal Swab; Nasopharyngeal(NP) swabs in vial transport medium  Result Value Ref Range Status   SARS Coronavirus 2 by RT PCR NEGATIVE NEGATIVE Final    Comment: (NOTE) SARS-CoV-2 target nucleic acids are NOT DETECTED.  The SARS-CoV-2 RNA is generally detectable in upper respiratory specimens during the acute phase of infection. The lowest concentration of SARS-CoV-2 viral copies this assay can detect is 138 copies/mL. A negative result does not preclude SARS-Cov-2 infection and should not be used as the sole basis for treatment or other patient management decisions. A negative result may occur with  improper specimen collection/handling, submission of specimen other than nasopharyngeal swab, presence of viral mutation(s) within the areas targeted by this assay, and inadequate number of viral copies(<138 copies/mL). A negative result must be combined with clinical observations, patient history,  and epidemiological information. The expected result is Negative.  Fact Sheet for Patients:  BloggerCourse.com  Fact Sheet for Healthcare Providers:  SeriousBroker.it  This test is no t yet approved or cleared by the [place] FDA and  has been authorized for detection and/or diagnosis of SARS-CoV-2 by FDA under an Emergency Use Authorization (EUA). This EUA will remain  in effect (meaning this test can be used) for the duration of the COVID-19 declaration under Section 564(b)(1) of the Act, 21 U.S.C.section 360bbb-3(b)(1), unless the authorization is terminated  or revoked sooner.       Influenza A by PCR NEGATIVE NEGATIVE Final   Influenza B by PCR NEGATIVE NEGATIVE Final    Comment: (NOTE) The Xpert Xpress SARS-CoV-2/FLU/RSV plus assay is intended as an aid in the diagnosis of influenza from Nasopharyngeal swab specimens and should not be used as a sole basis for treatment. Nasal washings and aspirates are unacceptable for Xpert Xpress SARS-CoV-2/FLU/RSV testing.  Fact Sheet for Patients: BloggerCourse.com  Fact Sheet for Healthcare Providers: SeriousBroker.it  This test is not yet approved or cleared by the [place] FDA and has been authorized for detection and/or diagnosis of SARS-CoV-2 by FDA under an Emergency Use Authorization (EUA). This EUA will remain in effect (meaning this test can be used) for the duration of the COVID-19 declaration under Section 564(b)(1) of the Act, 21 U.S.C. section 360bbb-3(b)(1), unless the authorization is terminated or revoked.  Performed at [facility], [address], [place], [place] [address]      Radiology Studies: CT ABDOMEN PELVIS WO CONTRAST  Result Date: 08/29/2020 CLINICAL DATA:  55 year old female with abdominal pain and fever. EXAM: CT ABDOMEN AND PELVIS WITHOUT CONTRAST TECHNIQUE: Multidetector CT  imaging of the abdomen and pelvis was performed following the standard protocol without IV contrast. COMPARISON:  CT abdomen pelvis dated 07/03/2020. FINDINGS: Evaluation of this exam is limited in the absence of intravenous contrast. Lower chest: Bibasilar scarring. Small chronic fluid noted along the anterior pleural space. No intra-abdominal free air.  Small free fluid in the pelvis.  Hepatobiliary: Diffuse fatty liver. No intrahepatic biliary ductal dilatation. The gallbladder is unremarkable. Pancreas: Unremarkable. No pancreatic ductal dilatation or surrounding inflammatory changes. Spleen: Normal in size without focal abnormality. Adrenals/Urinary Tract: The adrenal glands unremarkable. The kidneys, visualized ureters, and urinary bladder appear unremarkable. Stomach/Bowel: The stomach is distended. Percutaneous gastrostomy with tip in the distal body of the stomach. There is no bowel obstruction or active inflammation. The appendix is normal. Vascular/Lymphatic: Mild aortoiliac atherosclerotic disease. The IVC is unremarkable. No portal venous gas. There is no adenopathy. Reproductive: The uterus is anteverted and grossly unremarkable. No adnexal masses. Other: None Musculoskeletal: No acute or significant osseous findings. IMPRESSION: 1. Distended stomach. No bowel obstruction. Normal appendix. 2. Fatty liver. 3. Aortic Atherosclerosis (ICD10-I70.0). Electronically Signed   By: Elgie Collard M.D.   On: 08/29/2020 21:10   CT Head Wo Contrast  Result Date: 08/29/2020 CLINICAL DATA:  Decreased oxygen saturation. Rule out cerebral hemorrhage. EXAM: CT HEAD WITHOUT CONTRAST TECHNIQUE: Contiguous axial images were obtained from the base of the skull through the vertex without intravenous contrast. COMPARISON:  03/19/2020 FINDINGS: Brain: Age advanced cerebral atrophy. Moderate and age advanced low density in the periventricular white matter likely related to small vessel disease. No mass lesion, hemorrhage,  hydrocephalus, acute infarct, intra-axial, or extra-axial fluid collection. Vascular: Intracranial atherosclerosis. Skull: Normal Sinuses/Orbits: Normal imaged portions of the orbits and globes. Clear paranasal sinuses and mastoid air cells. Aerated right petrous apex. Other: None. IMPRESSION: 1. No acute intracranial abnormality. 2. Cerebral atrophy and small vessel ischemic change. Electronically Signed   By: Jeronimo Greaves M.D.   On: 08/29/2020 17:48   DG Chest Port 1 View  Result Date: 08/29/2020 CLINICAL DATA:  Shortness of breath EXAM: PORTABLE CHEST 1 VIEW COMPARISON:  08/01/2020, CT from 02/28/2020 FINDINGS: Tracheostomy tube is again seen and stable. Cardiac shadow is within normal limits. The lungs are hyperinflated but stable. Some scarring is noted in the right base. Small nodular changes are noted in the right apex stable from the previous exam. No new focal infiltrate is seen. IMPRESSION: Stable nodular changes in the right apex unchanged from prior CT. Hyperinflation consistent with COPD. Electronically Signed   By: Alcide Clever M.D.   On: 08/29/2020 16:44    Pamella Pert, MD, PhD [facility]  Between 7 am - 7 pm I am available, please contact me via Amion (for emergencies) or Securechat (non urgent messages)  Between 7 pm - 7 am I am not available, please contact night coverage MD/APP via Amion

## 2020-08-30 NOTE — Progress Notes (Signed)
Initial Nutrition Assessment  DOCUMENTATION CODES:   Non-severe (moderate) malnutrition in context of chronic illness  INTERVENTION:   Initiate tube feeding via PEG: Osmolite 1.5 237 ml (1 carton) 5 times per day Prosource TF 45 ml BID  Provides 1855 kcal, 97 gm protein, 905 ml free water daily.  Free water flushes 90 ml before and after each bolus feeding for a total of 1805 ml free water daily.  MVI with minerals via tube once daily.  Juven 1 packet via tube BID, each packet provides 80 calories, 8 grams of carbohydrate, 2.5 grams of protein (collagen), 7 grams of L-arginine and 7 grams of L-glutamine; supplement contains CaHMB, Vitamins C, E, B12 and Zinc to promote wound healing.   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  GOAL:   Patient will meet greater than or equal to 90% of their needs  MONITOR:   Diet advancement,PO intake,Vent status,Labs,TF tolerance  REASON FOR ASSESSMENT:   Ventilator,Consult Enteral/tube feeding initiation and management  ASSESSMENT:   55 yo female admitted with decreased responsiveness. In ED, several mucous plugs were removed. 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   Discussed patient in ICU rounds and with RN today.  Spoke with patient's husband. PTA, patient was eating regular foods by mouth and drinking Ensure once daily. She was also receiving Osmolite 1.5 bolus TF, 1 carton daily via PEG. Husband says patient has been eating well at home.   Patient is familiar to this RD from multiple recent admissions. This admission, she appears to have a greater degree of muscle depletion. Intake PTA was likely inadequate. Currently NPO. Received MD Consult for TF initiation and management. Will begin bolus TF via PEG to meet 100% of nutrition needs, until  patient is able to take POs again.   Patient is currently intubated on ventilator support MV: 5.9 L/min Temp (24hrs), Avg:98 F (36.7 C), Min:97 F (36.1 C), Max:100.6 F (38.1 C)   Labs reviewed. Phos 6.1 CBG: [phone]  Medications reviewed and include lactulose, Protonix, Zosyn, vancomycin.  Noted progress note regarding wound supplies for sacral decubitus. Documentation from last admission shows stage 2 PI to R ankle and R buttocks. Awaiting new RN skin assessment.   Weight history reviewed. One month ago, patient weighed 58.5 kg. Currently 55 kg. 6% weight loss within one month is severe.   Patient meets criteria for moderate malnutrition with mild-moderate muscle depletion, mild fat depletion, and 6% weight loss x 1 month.   NUTRITION - FOCUSED PHYSICAL EXAM:  Flowsheet Row Most Recent Value  Orbital Region Mild depletion  Upper Arm Region No depletion  Thoracic and Lumbar Region Mild depletion  Buccal Region Mild depletion  Temple Region Moderate depletion  Clavicle Bone Region Mild depletion  Clavicle and Acromion Bone Region Mild depletion  Scapular Bone Region Unable to assess  Dorsal Hand Mild depletion  Patellar Region Moderate depletion  Anterior Thigh Region Moderate depletion  Posterior Calf Region Unable to assess  Edema (RD Assessment) Unable to assess  Hair Reviewed  Eyes Unable to assess  Mouth Unable to assess  Skin Reviewed  Nails Reviewed       Diet Order:   Diet Order            Diet NPO time specified  Diet effective now  EDUCATION NEEDS:   No education needs have been identified at this time  Skin:  Skin Assessment: Skin Integrity Issues: Skin Integrity Issues:: Stage II Stage II: R buttocks, R ankle from last admission  Last BM:  PTA  Height:   Ht Readings from Last 4 Encounters:  08/29/20 5\' 1"  (1.549 m)  07/21/20 5\' 5"  (1.651 m)  07/12/20 5\' 5"  (1.651 m)  07/09/20 5\' 4"  (1.626 m)    Weight:    Wt Readings from Last 1 Encounters:  08/30/20 55 kg    BMI:  Body mass index is 22.91 kg/m.  Estimated Nutritional Needs:   Kcal:  1800-2000  Protein:  80-100 gm  Fluid:  >/= 1.8 L    , RD, LDN, CNSC Please refer to Amion for contact information.

## 2020-08-30 NOTE — Progress Notes (Signed)
eLink Physician-Brief Progress Note Patient Name: Kathleen Cameron DOB: 1966-04-10 MRN: [id number]   Date of Service  08/30/2020  HPI/Events of Note  Patient has a tracheostomy and is on pressure support ventilation, she is experiencing an anxiety attack.  eICU Interventions  Xanax 0.25 mg via PEG Q 8 hours PRN anxiety ordered.        [name] 08/30/2020, 4:54 AM

## 2020-08-30 NOTE — Telephone Encounter (Signed)
  Kathleen Cameron from [employer] calling to request last OV note and to request orders for "wound supplies" She states patient has sacral decubitus wound The spouse contacted [employer] but did not say the specific supplies needed    [employer] phone [phone] option 2 Fax [phone]

## 2020-08-30 NOTE — Progress Notes (Signed)
PHARMACY - PHYSICIAN COMMUNICATION CRITICAL VALUE ALERT - BLOOD CULTURE IDENTIFICATION (BCID)  Kathleen Cameron is an 55 y.o. female who presented to [facility] on 08/29/2020 with a chief complaint of resp fx.  Name of physician (or Provider) Contacted: [facility] Elvera Lennox)  Current antibiotics: vancomycin + Zosyn  Changes to prescribed antibiotics recommended:  Patient is on recommended antibiotics - No changes needed  Results for orders placed or performed during the hospital encounter of 08/29/20  Blood Culture ID Panel (Reflexed) (Collected: 08/29/2020  4:45 PM)  Result Value Ref Range   Enterococcus faecalis NOT DETECTED NOT DETECTED   Enterococcus Faecium NOT DETECTED NOT DETECTED   Listeria monocytogenes NOT DETECTED NOT DETECTED   Staphylococcus species DETECTED (A) NOT DETECTED   Staphylococcus aureus (BCID) NOT DETECTED NOT DETECTED   Staphylococcus epidermidis NOT DETECTED NOT DETECTED   Staphylococcus lugdunensis NOT DETECTED NOT DETECTED   Streptococcus species NOT DETECTED NOT DETECTED   Streptococcus agalactiae NOT DETECTED NOT DETECTED   Streptococcus pneumoniae NOT DETECTED NOT DETECTED   Streptococcus pyogenes NOT DETECTED NOT DETECTED   A.calcoaceticus-baumannii NOT DETECTED NOT DETECTED   Bacteroides fragilis NOT DETECTED NOT DETECTED   Enterobacterales NOT DETECTED NOT DETECTED   Enterobacter cloacae complex NOT DETECTED NOT DETECTED   Escherichia coli NOT DETECTED NOT DETECTED   Klebsiella aerogenes NOT DETECTED NOT DETECTED   Klebsiella oxytoca NOT DETECTED NOT DETECTED   Klebsiella pneumoniae NOT DETECTED NOT DETECTED   Proteus species NOT DETECTED NOT DETECTED   Salmonella species NOT DETECTED NOT DETECTED   Serratia marcescens NOT DETECTED NOT DETECTED   Haemophilus influenzae NOT DETECTED NOT DETECTED   Neisseria meningitidis NOT DETECTED NOT DETECTED   Pseudomonas aeruginosa NOT DETECTED NOT DETECTED   Stenotrophomonas maltophilia NOT DETECTED NOT  DETECTED   Candida albicans NOT DETECTED NOT DETECTED   Candida auris NOT DETECTED NOT DETECTED   Candida glabrata NOT DETECTED NOT DETECTED   Candida krusei NOT DETECTED NOT DETECTED   Candida parapsilosis NOT DETECTED NOT DETECTED   Candida tropicalis NOT DETECTED NOT DETECTED   Cryptococcus neoformans/gattii NOT DETECTED NOT Vergie Living 08/30/2020  6:47 PM

## 2020-08-30 NOTE — Telephone Encounter (Signed)
Agree with ED evaluation. 

## 2020-08-30 NOTE — Progress Notes (Signed)
Pt placed back on full ventilator support for night time rest. 

## 2020-08-31 ENCOUNTER — Inpatient Hospital Stay (HOSPITAL_COMMUNITY): Payer: Medicaid Other

## 2020-08-31 DIAGNOSIS — J9611 Chronic respiratory failure with hypoxia: Secondary | ICD-10-CM | POA: Diagnosis not present

## 2020-08-31 LAB — BASIC METABOLIC PANEL
Anion gap: 7 (ref 5–15)
BUN: 17 mg/dL (ref 6–20)
CO2: 32 mmol/L (ref 22–32)
Calcium: 9.3 mg/dL (ref 8.9–10.3)
Chloride: 98 mmol/L (ref 98–111)
Creatinine, Ser: 0.92 mg/dL (ref 0.44–1.00)
GFR, Estimated: >60 mL/min (ref >60)
Glucose, Bld: 130 mg/dL — ABNORMAL HIGH (ref 70–99)
Potassium: 3.2 mmol/L — ABNORMAL LOW (ref 3.5–5.1)
Sodium: 137 mmol/L (ref 135–145)

## 2020-08-31 LAB — CBC
HCT: 27.5 % — ABNORMAL LOW (ref 36.0–46.0)
Hemoglobin: 8.2 g/dL — ABNORMAL LOW (ref 12.0–15.0)
MCH: 25.7 pg — ABNORMAL LOW (ref 26.0–34.0)
MCHC: 29.8 g/dL — ABNORMAL LOW (ref 30.0–36.0)
MCV: 86.2 fL (ref 80.0–100.0)
Platelets: 245 10*3/uL (ref 150–400)
RBC: 3.19 MIL/uL — ABNORMAL LOW (ref 3.87–5.11)
RDW: 15.5 % (ref 11.5–15.5)
WBC: 8.6 10*3/uL (ref 4.0–10.5)
nRBC: 0 % (ref 0.0–0.2)

## 2020-08-31 LAB — MAGNESIUM: Magnesium: 1.7 mg/dL (ref 1.7–2.4)

## 2020-08-31 LAB — GLUCOSE, CAPILLARY
Glucose-Capillary: 102 mg/dL — ABNORMAL HIGH (ref 70–99)
Glucose-Capillary: 111 mg/dL — ABNORMAL HIGH (ref 70–99)
Glucose-Capillary: 95 mg/dL (ref 70–99)
Glucose-Capillary: 86 mg/dL (ref 70–99)
Glucose-Capillary: 113 mg/dL — ABNORMAL HIGH (ref 70–99)

## 2020-08-31 LAB — URINE CULTURE: Culture: 50000 — AB

## 2020-08-31 LAB — CULTURE, BLOOD (ROUTINE X 2): Special Requests: ADEQUATE

## 2020-08-31 MED ORDER — MAGNESIUM SULFATE 2 GM/50ML IV SOLN
2.0000 g | Freq: Once | INTRAVENOUS | Status: AC
  Administered 2020-08-31: 2 g via INTRAVENOUS
  Filled 2020-08-31: qty 50

## 2020-08-31 MED ORDER — POTASSIUM CHLORIDE 10 MEQ/100ML IV SOLN
10.0000 meq | INTRAVENOUS | Status: DC
  Administered 2020-08-31: 10 meq via INTRAVENOUS
  Filled 2020-08-31 (×4): qty 100

## 2020-08-31 MED ORDER — OSMOLITE 1.5 CAL PO LIQD
237.0000 mL | Freq: Every day | ORAL | Status: DC
  Filled 2020-08-31 (×8): qty 237

## 2020-08-31 MED ORDER — POTASSIUM CHLORIDE 20 MEQ PO PACK
20.0000 meq | PACK | Freq: Once | ORAL | Status: AC
  Administered 2020-08-31: 20 meq
  Filled 2020-08-31: qty 1

## 2020-08-31 MED ORDER — DEXTROSE IN LACTATED RINGERS 5 % IV SOLN: INTRAVENOUS | Status: AC

## 2020-08-31 MED ORDER — POTASSIUM CHLORIDE 20 MEQ PO PACK
20.0000 meq | PACK | ORAL | Status: AC
  Administered 2020-08-31 (×3): 20 meq
  Filled 2020-08-31 (×2): qty 1

## 2020-08-31 NOTE — Progress Notes (Signed)
RT NOTES: Pt unable to tolerate pressure support on vent at this time. Pt states not feeling well and blood pressure is 156/113 with a HR 118. RN aware and at bedside.Placed back on full support at this time.

## 2020-08-31 NOTE — Progress Notes (Signed)
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  Apparently had an episode of desaturation at 85% on home vent.  She has had multiple recent hospitalizations for mucous plugging event resulting in respiratory arrest, followed by pulmonology as an outpatient in the trach clinic.  Recent cuffed 6.0 trach replacement 4/27.  In the ED she was febrile 38.1 WBC 23.7, concern for sepsis.  She was started on Vanco and Zosyn.  In the ED RT removed several mucous plugs  Subjective / 24h Interval events: She is much more alert this morning, wakes up and tells me that her IV site is painful.  She denies any abdominal pain, denies any chest pain, no shortness of breath.  She denies any nausea or vomiting.  Assessment & Plan: Principal Problem Chronic respiratory failure with tracheostomy, chronic mechanical ventilation, severe sepsis with likely pulmonary origin -patient was initially placed on broad-spectrum antibiotics with vancomycin and Zosyn -Blood cultures showing out of 4 bottles of staph species, likely contaminant -Urine cultures with Pseudomonas at 50,000 colonies, pansensitive.  Given lack of urinary symptoms this may be colonizer -For now continue broad-spectrum antibiotics for at least another 24 hours and will narrow tomorrow -Continue vent support per PCCM  Active Problems COPD with recurrent mucous plugging-no wheezing this morning, continue as needed nebs, hypertonic saline  Hypothyroidism-continue Synthroid  Generalized abdominal pain-CT scan of the abdomen pelvis negative for acute findings.  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 apixaban  5 mg Per Tube BID  . arformoterol  15 mcg Nebulization BID  . budesonide  0.25 mg Nebulization BID  . chlorhexidine gluconate (MEDLINE KIT)  15 mL Mouth Rinse BID  . Chlorhexidine Gluconate Cloth  6 each Topical Daily  . clonazePAM  0.5 mg Per Tube TID  . feeding supplement (OSMOLITE 1.5 CAL)  237 mL Per Tube 5 X Daily  . feeding supplement (PROSource TF)  45 mL Per Tube BID  . free water  180 mL Per Tube 5 X Daily  . ipratropium-albuterol  3 mL Nebulization Q4H  . lactulose  10 g Per Tube Daily  . levothyroxine  50 mcg Per Tube Q0600  . mouth rinse  15 mL Mouth Rinse 10 times per day  . multivitamin with minerals  1 tablet Per Tube Daily  . nutrition supplement (JUVEN)  1 packet Per Tube BID BM  . pantoprazole sodium  40 mg Per Tube Daily  . revefenacin  175 mcg Nebulization Daily  . rosuvastatin  40 mg Per Tube Daily  . simethicone  40 mg Per Tube Q6H  . sodium chloride HYPERTONIC  4 mL Nebulization Daily  . venlafaxine  37.5 mg Per Tube BID WC   Continuous Infusions: . sodium chloride Stopped (08/31/20 0535)  . sodium chloride Stopped (08/31/20 0606)  . lactated ringers    . piperacillin-tazobactam (ZOSYN)  IV 12.5 mL/hr at 08/31/20 0700  . sodium chloride    . vancomycin Stopped (08/30/20 1839)   PRN Meds:.sodium chloride, acetaminophen, ALPRAZolam, bisacodyl, docusate, HYDROmorphone (DILAUDID) injection, ipratropium-albuterol, metoCLOPramide, ondansetron (ZOFRAN) IV, phenol, polyethylene glycol, temazepam  Diet Orders (From admission, onward)    Start     Ordered   08/29/20 1617  Diet NPO time specified  (Undifferentiated presentation (screening labs and basic nursing orders))  Diet effective now        08/29/20 1616          DVT prophylaxis: SCDs Start: 08/29/20 2024 apixaban (ELIQUIS) tablet 5 mg     Code Status:  Full Code  Family Communication: no family at bedside   Status is: Inpatient  Remains inpatient appropriate because:Inpatient level of care appropriate due to severity of illness   Dispo: The patient is from: Home              Anticipated d/c is to: Home              Patient currently is not medically stable to d/c.   Difficult to place patient No  Level of care: ICU  Consultants:  PCCM  Procedures:  none  Microbiology  Cultures 5/11 - pending  Antimicrobials: Vancomycin / Zosyn 5/11 >>    Objective: Vitals:   08/31/20 0700 08/31/20 0730 08/31/20 0812 08/31/20 0833  BP: 130/76 125/78 131/81   Pulse: 96 100 (!) 104   Resp: (!) _0 Temp:  98 F (36.7 C)    TempSrc:  Oral    SpO2: 100% 98% 100% 96%  Weight:      Height:        Intake/Output Summary (Last 24 hours) at 08/31/2020 0936 Last data filed at 08/31/2020 0700 Gross per 24 hour  Intake 1905.13 ml  Output 550 ml  Net 1355.13 ml   Filed Weights   08/29/20 2200 08/30/20 0451 08/31/20 0500  Weight: 55 kg 55 kg 53.3 kg    Examination:  Constitutional: She is in no distress, alert Eyes: No scleral icterus ENMT: Mucous membranes are moist.  Neck: normal, supple Respiratory: Breathing with the vent, clear to auscultation without wheezing or crackles Cardiovascular: Regular rate and rhythm, no murmurs, no peripheral edema Abdomen: Soft, NT, ND, bowel sounds positive, PEG tube in place Musculoskeletal: no clubbing / cyanosis.  Skin: no rashes Neurologic: Nonfocal  Data Reviewed: I have independently reviewed following labs and imaging studies   CBC: Recent Labs  Lab 08/29/20 1645 08/29/20 1654 08/29/20 2230 08/30/20 0901 08/31/20 0051  WBC 23.7*  --   --  15.0* 8.6  NEUTROABS 20.0*  --   --  12.5*  --   HGB 11.0* 12.2 10.5* 9.8* 8.2*  HCT 37.9 36.0 31.0* 33.1* 27.5*  MCV 88.1  --   --  87.3 86.2  PLT 438*  --   --  363 909   Basic Metabolic Panel: Recent Labs  Lab 08/29/20 1645  08/29/20 1654 08/29/20 2230 08/30/20 0901 08/31/20 0051  NA 137 137 137 137 137  K 4.2 4.3 4.2 4.0 3.2*  CL 96* 98  --  97* 98  CO2 31  --   --  30 32  GLUCOSE 184* 188*  --  114* 130*  BUN 14 17  --  19 17  CREATININE 0.96 0.90  --  1.13* 0.92  CALCIUM 10.2  --   --  10.0 9.3  MG  --   --   --  1.7 1.7  PHOS  --   --   --  6.1*  --    Liver Function Tests: Recent Labs  Lab 08/29/20 1645  AST 26  ALT 14  ALKPHOS 84  BILITOT 0.7  PROT 8.6*  ALBUMIN 3.1*   Coagulation Profile: Recent Labs  Lab 08/29/20 1645  INR 1.4*   HbA1C: No results for input(s): HGBA1C in the last 72 hours. CBG: Recent Labs  Lab 08/30/20 1106 08/30/20 1501 08/30/20 2020 08/30/20 2350 08/31/20 0750  GLUCAP 96 78 103* 117* 102*    Recent Results (from the past 240 hour(s))  MRSA PCR Screening     Status: None   Collection Time: 08/29/20  4:55 AM   Specimen: Nasal Mucosa; Nasopharyngeal  Result Value Ref Range Status   MRSA by PCR NEGATIVE NEGATIVE Final    Comment:        The GeneXpert MRSA Assay (FDA approved for NASAL specimens only), is one component of a comprehensive MRSA colonization surveillance program. It is not intended to diagnose MRSA infection nor to guide or monitor treatment for MRSA infections. Performed at [facility], [place] [address], [place], [place] [address]   Urine culture     Status: Abnormal   Collection Time: 08/29/20  4:45 PM   Specimen: In/Out Cath Urine  Result Value Ref Range Status   Specimen Description IN/OUT CATH URINE  Final   Special Requests   Final    NONE Performed at [facility], [place] [address] [place], [place] [address]    Culture 50,000 COLONIES/mL PSEUDOMONAS AERUGINOSA (A)  Final   Report Status 08/31/2020 FINAL  Final   Organism ID, Bacteria PSEUDOMONAS AERUGINOSA (A)  Final      Susceptibility   Pseudomonas aeruginosa - MIC*    CEFTAZIDIME <=1 SENSITIVE Sensitive     CIPROFLOXACIN <=0.25 SENSITIVE Sensitive      GENTAMICIN <=1 SENSITIVE Sensitive     IMIPENEM 2 SENSITIVE Sensitive     PIP/TAZO <=4 SENSITIVE Sensitive     CEFEPIME 2 SENSITIVE Sensitive     * 50,000 COLONIES/mL PSEUDOMONAS AERUGINOSA  Blood Culture (routine x 2)     Status: None (Preliminary result)   Collection Time: 08/29/20  4:45 PM   Specimen: BLOOD  Result Value Ref Range Status   Specimen Description BLOOD LEFT ANTECUBITAL  Final   Special Requests   Final    BOTTLES DRAWN AEROBIC AND ANAEROBIC Blood Culture adequate volume   Culture  Setup Time   Final    GRAM POSITIVE COCCI IN CLUSTERS AEROBIC BOTTLE ONLY Organism ID to follow CRITICAL RESULT CALLED TO, READ BACK BY AND VERIFIED WITH: Salome Holmes [facility] [id number] 08/30/20 A BROWNING Performed at [facility], [place] [address], [place], [place] [address]    Culture PENDING  Incomplete   Report Status PENDING  Incomplete  Blood Culture ID Panel (Reflexed)     Status: Abnormal   Collection Time: 08/29/20  4:45 PM  Result Value Ref Range Status   Enterococcus faecalis NOT DETECTED NOT DETECTED Final   Enterococcus Faecium NOT DETECTED NOT DETECTED Final   Listeria monocytogenes NOT DETECTED NOT DETECTED Final   Staphylococcus species DETECTED (A) NOT DETECTED Final    Comment: CRITICAL RESULT CALLED TO, READ BACK BY AND VERIFIED WITH: Salome Holmes PHARMD [id number] 08/30/20 A BROWNING    Staphylococcus aureus (BCID) NOT DETECTED NOT DETECTED Final   Staphylococcus epidermidis NOT DETECTED NOT DETECTED Final   Staphylococcus lugdunensis NOT DETECTED NOT DETECTED Final   Streptococcus species NOT DETECTED NOT DETECTED Final   Streptococcus agalactiae NOT DETECTED NOT DETECTED Final   Streptococcus pneumoniae NOT DETECTED NOT DETECTED Final   Streptococcus pyogenes NOT DETECTED NOT  DETECTED Final   A.calcoaceticus-baumannii NOT DETECTED NOT DETECTED Final   Bacteroides fragilis NOT DETECTED NOT DETECTED Final   Enterobacterales NOT DETECTED NOT DETECTED Final   Enterobacter cloacae  complex NOT DETECTED NOT DETECTED Final   Escherichia coli NOT DETECTED NOT DETECTED Final   Klebsiella aerogenes NOT DETECTED NOT DETECTED Final   Klebsiella oxytoca NOT DETECTED NOT DETECTED Final   Klebsiella pneumoniae NOT DETECTED NOT DETECTED Final   Proteus species NOT DETECTED NOT DETECTED Final   Salmonella species NOT DETECTED NOT DETECTED Final   Serratia marcescens NOT DETECTED NOT DETECTED Final   Haemophilus influenzae NOT DETECTED NOT DETECTED Final   Neisseria meningitidis NOT DETECTED NOT DETECTED Final   Pseudomonas aeruginosa NOT DETECTED NOT DETECTED Final   Stenotrophomonas maltophilia NOT DETECTED NOT DETECTED Final   Candida albicans NOT DETECTED NOT DETECTED Final   Candida auris NOT DETECTED NOT DETECTED Final   Candida glabrata NOT DETECTED NOT DETECTED Final   Candida krusei NOT DETECTED NOT DETECTED Final   Candida parapsilosis NOT DETECTED NOT DETECTED Final   Candida tropicalis NOT DETECTED NOT DETECTED Final   Cryptococcus neoformans/gattii NOT DETECTED NOT DETECTED Final    Comment: Performed at [facility], [address] [place], [place] [address]  Resp Panel by RT-PCR (Flu A&B, Covid) Nasopharyngeal Swab     Status: None   Collection Time: 08/29/20  5:03 PM   Specimen: Nasopharyngeal Swab; Nasopharyngeal(NP) swabs in vial transport medium  Result Value Ref Range Status   SARS Coronavirus 2 by RT PCR NEGATIVE NEGATIVE Final    Comment: (NOTE) SARS-CoV-2 target nucleic acids are NOT DETECTED.  The SARS-CoV-2 RNA is generally detectable in upper respiratory specimens during the acute phase of infection. The lowest concentration of SARS-CoV-2 viral copies this assay can detect is 138 copies/mL. A negative result does not preclude SARS-Cov-2 infection and should not be used as the sole basis for treatment or other patient management decisions. A negative result may occur with  improper specimen collection/handling, submission of specimen  other than nasopharyngeal swab, presence of viral mutation(s) within the areas targeted by this assay, and inadequate number of viral copies(<138 copies/mL). A negative result must be combined with clinical observations, patient history, and epidemiological information. The expected result is Negative.  Fact Sheet for Patients:  EntrepreneurPulse.com.au  Fact Sheet for Healthcare Providers:  IncredibleEmployment.be  This test is no t yet approved or cleared by the [place] FDA and  has been authorized for detection and/or diagnosis of SARS-CoV-2 by FDA under an Emergency Use Authorization (EUA). This EUA will remain  in effect (meaning this test can be used) for the duration of the COVID-19 declaration under Section 564(b)(1) of the Act, 21 U.S.C.section 360bbb-3(b)(1), unless the authorization is terminated  or revoked sooner.       Influenza A by PCR NEGATIVE NEGATIVE Final   Influenza B by PCR NEGATIVE NEGATIVE Final    Comment: (NOTE) The Xpert Xpress SARS-CoV-2/FLU/RSV plus assay is intended as an aid in the diagnosis of influenza from Nasopharyngeal swab specimens and should not be used as a sole basis for treatment. Nasal washings and aspirates are unacceptable for Xpert Xpress SARS-CoV-2/FLU/RSV testing.  Fact Sheet for Patients: EntrepreneurPulse.com.au  Fact Sheet for Healthcare Providers: IncredibleEmployment.be  This test is not yet approved or cleared by the [place] FDA and has been authorized for detection and/or diagnosis of SARS-CoV-2 by FDA under an Emergency Use Authorization (EUA). This EUA will remain in effect (meaning this  test can be used) for the duration of the COVID-19 declaration under Section 564(b)(1) of the Act, 21 U.S.C. section 360bbb-3(b)(1), unless the authorization is terminated or revoked.  Performed at [facility], [place] [address] [place],  [place] [address]      Radiology Studies: No results found.  Marzetta Board, MD, PhD [facility]  Between 7 am - 7 pm I am available, please contact me via Amion (for emergencies) or Securechat (non urgent messages)  Between 7 pm - 7 am I am not available, please contact night coverage MD/APP via Amion

## 2020-08-31 NOTE — Progress Notes (Signed)
[facility] ADULT ICU REPLACEMENT PROTOCOL   The patient does apply for the [facility] Adult ICU Electrolyte Replacment Protocol based on the criteria listed below:   1. Is GFR >/= 30 ml/min? Yes.    Patient's GFR today is >60 2. Is SCr </= 2? Yes.   Patient's SCr is 0.92 ml/kg/hr 3. Did SCr increase >/= 0.5 in 24 hours? No. 4. Abnormal electrolyte(s):  K 3.2 5. Ordered repletion with: protocol 6. If a panic level lab has been reported, has the CCM MD in charge been notified? Yes.  .   Physician:  S. Bobbye Morton R Kielee Care 08/31/2020 5:38 AM

## 2020-08-31 NOTE — Telephone Encounter (Signed)
Just spoke to [employer] office regarding these supplies. They need two things: 1- Visiting Nurse clinical notes describing and assessing sacral wound and, 2- Fax of written prescription for the [facility] Mepilex Border Sacrum Self adherent Silicone Foam Dressing pads. Lets get those to them please. Thanks.

## 2020-08-31 NOTE — Evaluation (Signed)
Clinical/Bedside Swallow Evaluation Patient Details  Name: Kathleen Cameron MRN: [id number] Date of Birth: 1965/12/27  Today's Date: 08/31/2020 Time: SLP Start Time (ACUTE ONLY): 1345 SLP Stop Time (ACUTE ONLY): 1430 SLP Time Calculation (min) (ACUTE ONLY): 45 min  Past Medical History:  Past Medical History:  Diagnosis Date  . Acute on chronic respiratory failure with hypoxia (HCC)   . Anxiety disorder   . Asthma   . COPD (chronic obstructive pulmonary disease) (HCC)   . COPD, severe (HCC)   . Hypertension   . Seizure disorder [facility])    Past Surgical History:  Past Surgical History:  Procedure Laterality Date  . COLONOSCOPY N/A 06/26/2020   Procedure: COLONOSCOPY;  Surgeon: Jeani Hawking, MD;  Location: [facility] ENDOSCOPY;  Service: Endoscopy;  Laterality: N/A;  . ENTEROSCOPY N/A 06/26/2020   Procedure: ENTEROSCOPY;  Surgeon: Jeani Hawking, MD;  Location: [facility] ENDOSCOPY;  Service: Endoscopy;  Laterality: N/A;  . HEMOSTASIS CLIP PLACEMENT  06/26/2020   Procedure: HEMOSTASIS CLIP PLACEMENT;  Surgeon: Jeani Hawking, MD;  Location: [facility] ENDOSCOPY;  Service: Endoscopy;;  . HOT HEMOSTASIS N/A 06/26/2020   Procedure: HOT HEMOSTASIS (ARGON PLASMA COAGULATION/BICAP);  Surgeon: Jeani Hawking, MD;  Location: [facility] ENDOSCOPY;  Service: Endoscopy;  Laterality: N/A;  . IR FLUORO RM 30-60 MIN  03/23/2020  . LAPAROSCOPIC INSERTION GASTROSTOMY TUBE N/A 04/24/2020   Procedure: LAPAROSCOPIC GASTROSTOMY TUBE PLACEMENT;  Surgeon: Sheliah Hatch De Blanch, MD;  Location: [facility] OR;  Service: General;  Laterality: N/A;  . POLYPECTOMY  06/26/2020   Procedure: POLYPECTOMY;  Surgeon: Jeani Hawking, MD;  Location: [facility] ENDOSCOPY;  Service: Endoscopy;;  . WISDOM TOOTH EXTRACTION     HPI:  55 year old female admitted with recurrent mucous plugging, severe sepsis, chronic respiratory failure with trach, home vent. Apparently had an episode of desaturation at 85% on home vent.  She has had multiple recent hospitalizations for mucous plugging  events resulting in respiratory arrest; followed by pulmonology as an outpatient in the trach clinic. Last seen 427 for trach change.   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 FEES 06/15/20 revealed mild oropharyngeal dysphagia specific to pharyngeal residue when inline PMV was not in place.  Without speaking valve, there was transient penetration of thin liquids on initial trial only, but no further penetration and no aspiration. Regular diet and thin liquids was recommended.   Assessment / Plan / Recommendation Clinical Impression  Pt with trach/vent and on PRVC today.  She participated in clinical swallowing assessment and was able to answer questions by mouthing words and using some intraoral pressure to create plosive sounds. PTA she ate a regular diet, drank thin liquids.  She was not using the inline PMV at home per her report.  She endorses frequent belching and occasional vomiting, unrelated to PO intake. Today, she self-fed several bites of applesauce, cracker, and sips of water from a straw without discomfort or report of difficulty. However, the absence of typical s/s of aspiration is expected given pt's trach/vent status (cuff inflation prevents access to upper airway and impacts laryngeal motor function, sensory response). She displayed occasional desaturation into the mid 80s during assessment. We discussed repeating a FEES - pt was very reluctant, saying that last time "it hurt."  CXR shows no new focal infiltrate.  It is unlikely pt has had significant change in swallow function since Feb FEES and CXR supports stability; however, desaturation events may correlate with impaired swallow function. For tonight, recommend small sips of water/ice chips.  SLP will f/u  next date and likely proceed with repeat FEES if pt is willing.  D/W pt, RN, who are amenable to plan. SLP Visit Diagnosis: Dysphagia, unspecified (R13.10)    Aspiration Risk    tbd   Diet Recommendation   sips and chips; otherwise NPO, PEG feedings  Medication Administration: Via alternative means    Other  Recommendations Oral Care Recommendations: Oral care QID;Oral care prior to ice chip/H20   Follow up Recommendations None        Swallow Study   General HPI: 55 year old female admitted with recurrent mucous plugging, severe sepsis, chronic respiratory failure with trach, home vent. Apparently had an episode of desaturation at 85% on home vent.  She has had multiple recent hospitalizations for mucous plugging events resulting in respiratory arrest; followed by pulmonology as an outpatient in the trach clinic. Last seen 427 for trach change.   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 FEES 06/15/20 revealed mild oropharyngeal dysphagia specific to pharyngeal residue when inline PMV was not in place.  Without speaking valve, there was transient penetration of thin liquids on initial trial only, but no further penetration and no aspiration. Regular diet and thin liquids was recommended. Type of Study: Bedside Swallow Evaluation Previous Swallow Assessment: see HPI Diet Prior to this Study: NPO Temperature Spikes Noted: No Respiratory Status: Ventilator;Trach Trach Size and Type: Cuff;#6 Behavior/Cognition: Alert;Cooperative;Pleasant mood Oral Cavity Assessment: Within Functional Limits Oral Care Completed by SLP: Recent completion by staff Oral Cavity - Dentition: Adequate natural dentition Vision: Functional for self-feeding Self-Feeding Abilities: Able to feed self Patient Positioning: Upright in bed Baseline Vocal Quality: Not observed Volitional Cough:  (pt on vent) Volitional Swallow: Able to elicit    Oral/Motor/Sensory Function Overall Oral Motor/Sensory Function: Within functional limits   Ice Chips Ice chips: Within functional limits   Thin Liquid Thin Liquid: Within functional limits    Nectar Thick  Nectar Thick Liquid: Not tested   Honey Thick Honey Thick Liquid: Not tested   Puree Puree: Within functional limits   Solid     Solid: Within functional limits      Blenda Mounts Laurice 08/31/2020,2:47 PM  Marchelle Folks L. Samson Frederic, MA CCC/SLP Acute Rehabilitation Services Office number [phone] Pager [phone]

## 2020-09-01 DIAGNOSIS — R1084 Generalized abdominal pain: Secondary | ICD-10-CM | POA: Diagnosis not present

## 2020-09-01 DIAGNOSIS — R6521 Severe sepsis with septic shock: Secondary | ICD-10-CM | POA: Diagnosis not present

## 2020-09-01 DIAGNOSIS — J9611 Chronic respiratory failure with hypoxia: Secondary | ICD-10-CM | POA: Diagnosis not present

## 2020-09-01 LAB — COMPREHENSIVE METABOLIC PANEL
ALT: 19 U/L (ref 0–44)
AST: 27 U/L (ref 15–41)
Albumin: 2.6 g/dL — ABNORMAL LOW (ref 3.5–5.0)
Alkaline Phosphatase: 57 U/L (ref 38–126)
Anion gap: 9 (ref 5–15)
BUN: 23 mg/dL — ABNORMAL HIGH (ref 6–20)
CO2: 30 mmol/L (ref 22–32)
Calcium: 9.9 mg/dL (ref 8.9–10.3)
Chloride: 104 mmol/L (ref 98–111)
Creatinine, Ser: 0.81 mg/dL (ref 0.44–1.00)
GFR, Estimated: >60 mL/min (ref >60)
Glucose, Bld: 123 mg/dL — ABNORMAL HIGH (ref 70–99)
Potassium: 3.8 mmol/L (ref 3.5–5.1)
Sodium: 143 mmol/L (ref 135–145)
Total Bilirubin: 0.8 mg/dL (ref 0.3–1.2)
Total Protein: 7 g/dL (ref 6.5–8.1)

## 2020-09-01 LAB — CBC
HCT: 28.1 % — ABNORMAL LOW (ref 36.0–46.0)
Hemoglobin: 8.4 g/dL — ABNORMAL LOW (ref 12.0–15.0)
MCH: 25.8 pg — ABNORMAL LOW (ref 26.0–34.0)
MCHC: 29.9 g/dL — ABNORMAL LOW (ref 30.0–36.0)
MCV: 86.2 fL (ref 80.0–100.0)
Platelets: 275 10*3/uL (ref 150–400)
RBC: 3.26 MIL/uL — ABNORMAL LOW (ref 3.87–5.11)
RDW: 15.7 % — ABNORMAL HIGH (ref 11.5–15.5)
WBC: 9.3 10*3/uL (ref 4.0–10.5)
nRBC: 0 % (ref 0.0–0.2)

## 2020-09-01 LAB — GLUCOSE, CAPILLARY
Glucose-Capillary: 114 mg/dL — ABNORMAL HIGH (ref 70–99)
Glucose-Capillary: 110 mg/dL — ABNORMAL HIGH (ref 70–99)
Glucose-Capillary: 117 mg/dL — ABNORMAL HIGH (ref 70–99)
Glucose-Capillary: 127 mg/dL — ABNORMAL HIGH (ref 70–99)
Glucose-Capillary: 115 mg/dL — ABNORMAL HIGH (ref 70–99)
Glucose-Capillary: 81 mg/dL (ref 70–99)

## 2020-09-01 LAB — MAGNESIUM: Magnesium: 2.2 mg/dL (ref 1.7–2.4)

## 2020-09-01 MED ORDER — LACTATED RINGERS IV BOLUS
500.0000 mL | Freq: Once | INTRAVENOUS | Status: AC
  Administered 2020-09-01: 500 mL via INTRAVENOUS

## 2020-09-01 MED ORDER — POLYETHYLENE GLYCOL 3350 17 G PO PACK
17.0000 g | PACK | Freq: Every day | ORAL | Status: DC
  Administered 2020-09-02 – 2020-09-09 (×6): 17 g
  Filled 2020-09-01 (×6): qty 1

## 2020-09-01 MED ORDER — POTASSIUM CHLORIDE 20 MEQ PO PACK
40.0000 meq | PACK | Freq: Once | ORAL | Status: AC
  Administered 2020-09-01: 40 meq
  Filled 2020-09-01: qty 2

## 2020-09-01 MED ORDER — POLYETHYLENE GLYCOL 3350 17 G PO PACK
17.0000 g | PACK | Freq: Every day | ORAL | Status: DC
  Filled 2020-09-01: qty 1

## 2020-09-01 NOTE — Progress Notes (Signed)
SLP Cancellation Note  Patient Details Name: Kathleen Cameron MRN: [id number] DOB: [birth year]   Cancelled treatment:       Reason Eval/Treat Not Completed: Medical issues which prohibited therapy- pt with ileus.  Hold on POs. SLP will follow along for readiness for possible FEES.  Sou Nohr L. Samson Frederic, MA CCC/SLP Acute Rehabilitation Services Office number [phone] Pager [phone]    Blenda Mounts Laurice 09/01/2020, 10:22 AM

## 2020-09-01 NOTE — Progress Notes (Signed)
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  Apparently had an episode of desaturation at 85% on home vent.  She has had multiple recent hospitalizations for mucous plugging event resulting in respiratory arrest, followed by pulmonology as an outpatient in the trach clinic.  Recent cuffed 6.0 trach replacement 4/27.  In the ED she was febrile 38.1 WBC 23.7, concern for sepsis.  She was started on Vanco and Zosyn.  In the ED RT removed several mucous plugs  Subjective / 24h Interval events: Complains of abdominal distention, mild discomfort.  No nausea or vomiting  Assessment & Plan: Principal Problem Chronic respiratory failure with tracheostomy, chronic mechanical ventilation, severe sepsis with likely pulmonary origin -patient was initially placed on broad-spectrum antibiotics with vancomycin and Zosyn -Blood cultures showing out of 4 bottles of staph species, likely contaminant -Urine cultures with Pseudomonas at 50,000 colonies, pansensitive.  Given lack of urinary symptoms this may be colonizer -Continue IV antibiotics given strict n.p.o. due to ileus -Continue vent support per PCCM  Active Problems Ileus -throughout the day yesterday patient with increased abdominal distention, an x-ray done last night showed findings suggestive of ileus.  We will keep strict n.p.o. today, no tube feeding, continue IV fluids.  She is passing gas and having bowel movements.  No nausea or vomiting.  COPD with recurrent mucous plugging-respiratory status appears to be stable, at her baseline, vent dependent  Hypothyroidism-continue Synthroid  Generalized abdominal pain-CT scan of the abdomen pelvis negative for acute findings.   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 apixaban  5 mg Per Tube BID  . arformoterol  15 mcg Nebulization BID  . budesonide  0.25 mg Nebulization BID  . chlorhexidine gluconate (MEDLINE KIT)  15 mL Mouth Rinse BID  . Chlorhexidine Gluconate Cloth  6 each Topical Daily  . clonazePAM  0.5 mg Per Tube TID  . [START ON 09/02/2020] feeding supplement (OSMOLITE 1.5 CAL)  237 mL Per Tube 5 X Daily  . feeding supplement (PROSource TF)  45 mL Per Tube BID  . free water  180 mL Per Tube 5 X Daily  . ipratropium-albuterol  3 mL Nebulization Q4H  . lactulose  10 g Per Tube Daily  . levothyroxine  50 mcg Per Tube Q0600  . mouth rinse  15 mL Mouth Rinse 10 times per day  . multivitamin with minerals  1 tablet Per Tube Daily  . nutrition supplement (JUVEN)  1 packet Per Tube BID BM  . pantoprazole sodium  40 mg Per Tube Daily  . [START ON 09/02/2020] polyethylene glycol  17 g Per Tube Daily  . revefenacin  175 mcg Nebulization Daily  . rosuvastatin  40 mg Per Tube Daily  . simethicone  40 mg Per Tube Q6H  . venlafaxine  37.5 mg Per Tube BID WC   Continuous Infusions: . sodium chloride Stopped (08/31/20 2128)  . sodium chloride Stopped (08/31/20 1907)  . dextrose 5% lactated ringers 75 mL/hr at 09/01/20 1000  . lactated ringers    . piperacillin-tazobactam (ZOSYN)  IV Stopped (09/01/20 0904)  . sodium  chloride    . vancomycin Stopped (08/31/20 1832)   PRN Meds:.sodium chloride, acetaminophen, bisacodyl, docusate, HYDROmorphone (DILAUDID) injection, ipratropium-albuterol, metoCLOPramide, ondansetron (ZOFRAN) IV, phenol, temazepam  Diet Orders (From admission, onward)    Start     Ordered   08/31/20 1450  Diet NPO time specified Except for: Ice Chips  (Undifferentiated presentation (screening  labs and basic nursing orders))  Diet effective now       Comments: Small sips water  Question:  Except for  Answer:  Ice Chips   08/31/20 1450          DVT prophylaxis: SCDs Start: 08/29/20 2024 apixaban (ELIQUIS) tablet 5 mg     Code Status: Full Code  Family Communication: no family at bedside   Status is: Inpatient  Remains inpatient appropriate because:Inpatient level of care appropriate due to severity of illness   Dispo: The patient is from: Home              Anticipated d/c is to: Home              Patient currently is not medically stable to d/c.   Difficult to place patient No  Level of care: ICU  Consultants:  PCCM  Procedures:  none  Microbiology  Cultures 5/11 - pending  Antimicrobials: Vancomycin / Zosyn 5/11 >>    Objective: Vitals:   09/01/20 0900 09/01/20 1000 09/01/20 1114 09/01/20 1124  BP: 123/72 (!) 147/94    Pulse: (!) 102 94    Resp: 14 13    Temp:   (!) 97.5 F (36.4 C)   TempSrc:   Axillary   SpO2: 100% 100%  100%  Weight:      Height:        Intake/Output Summary (Last 24 hours) at 09/01/2020 1143 Last data filed at 09/01/2020 1000 Gross per 24 hour  Intake 1359.03 ml  Output 700 ml  Net 659.03 ml   Filed Weights   08/30/20 0451 08/31/20 0500 09/01/20 0500  Weight: 55 kg 53.3 kg 54 kg    Examination:  Constitutional: NAD, alert Eyes: No icterus ENMT: mmm Neck: normal, supple Respiratory: Lungs are clear, moves air well bilaterally.  Breathing with the vent Cardiovascular: Regular rate and rhythm, no murmurs, no peripheral edema Abdomen: Mild distention noted, mild tenderness, no guarding or rebound.  PEG tube in place Musculoskeletal: no clubbing / cyanosis.  Skin: No rashes seen Neurologic: No focal deficits  Data Reviewed: I have independently reviewed following labs and imaging studies   CBC: Recent Labs  Lab 08/29/20 1645 08/29/20 1654 08/29/20 2230 08/30/20 0901 08/31/20 0051 09/01/20 0034  WBC  23.7*  --   --  15.0* 8.6 9.3  NEUTROABS 20.0*  --   --  12.5*  --   --   HGB 11.0* 12.2 10.5* 9.8* 8.2* 8.4*  HCT 37.9 36.0 31.0* 33.1* 27.5* 28.1*  MCV 88.1  --   --  87.3 86.2 86.2  PLT 438*  --   --  363 245 154   Basic Metabolic Panel: Recent Labs  Lab 08/29/20 1645 08/29/20 1654 08/29/20 2230 08/30/20 0901 08/31/20 0051 09/01/20 0034  NA 137 137 137 137 137 143  K 4.2 4.3 4.2 4.0 3.2* 3.8  CL 96* 98  --  97* 98 104  CO2 31  --   --  30 32 30  GLUCOSE 184* 188*  --  114* 130* 123*  BUN 14 17  --  19 17 23*  CREATININE 0.96  0.90  --  1.13* 0.92 0.81  CALCIUM 10.2  --   --  10.0 9.3 9.9  MG  --   --   --  1.7 1.7  --   PHOS  --   --   --  6.1*  --   --    Liver Function Tests: Recent Labs  Lab 08/29/20 1645 09/01/20 0034  AST 26 27  ALT 14 19  ALKPHOS 84 57  BILITOT 0.7 0.8  PROT 8.6* 7.0  ALBUMIN 3.1* 2.6*   Coagulation Profile: Recent Labs  Lab 08/29/20 1645  INR 1.4*   HbA1C: No results for input(s): HGBA1C in the last 72 hours. CBG: Recent Labs  Lab 08/31/20 1915 08/31/20 2309 09/01/20 0307 09/01/20 0711 09/01/20 1057  GLUCAP 86 113* 114* 110* 117*    Recent Results (from the past 240 hour(s))  MRSA PCR Screening     Status: None   Collection Time: 08/29/20  4:55 AM   Specimen: Nasal Mucosa; Nasopharyngeal  Result Value Ref Range Status   MRSA by PCR NEGATIVE NEGATIVE Final    Comment:        The GeneXpert MRSA Assay (FDA approved for NASAL specimens only), is one component of a comprehensive MRSA colonization surveillance program. It is not intended to diagnose MRSA infection nor to guide or monitor treatment for MRSA infections. Performed at [facility], [place] [address], [place], [place] [address]   Urine culture     Status: Abnormal   Collection Time: 08/29/20  4:45 PM   Specimen: In/Out Cath Urine  Result Value Ref Range Status   Specimen Description IN/OUT CATH URINE  Final   Special Requests   Final     NONE Performed at [facility], [place] [address], [place], [place] [address]    Culture 50,000 COLONIES/mL PSEUDOMONAS AERUGINOSA (A)  Final   Report Status 08/31/2020 FINAL  Final   Organism ID, Bacteria PSEUDOMONAS AERUGINOSA (A)  Final      Susceptibility   Pseudomonas aeruginosa - MIC*    CEFTAZIDIME <=1 SENSITIVE Sensitive     CIPROFLOXACIN <=0.25 SENSITIVE Sensitive     GENTAMICIN <=1 SENSITIVE Sensitive     IMIPENEM 2 SENSITIVE Sensitive     PIP/TAZO <=4 SENSITIVE Sensitive     CEFEPIME 2 SENSITIVE Sensitive     * 50,000 COLONIES/mL PSEUDOMONAS AERUGINOSA  Blood Culture (routine x 2)     Status: Abnormal   Collection Time: 08/29/20  4:45 PM   Specimen: BLOOD  Result Value Ref Range Status   Specimen Description BLOOD LEFT ANTECUBITAL  Final   Special Requests   Final    BOTTLES DRAWN AEROBIC AND ANAEROBIC Blood Culture adequate volume   Culture  Setup Time   Final    GRAM POSITIVE COCCI IN CLUSTERS AEROBIC BOTTLE ONLY CRITICAL RESULT CALLED TO, READ BACK BY AND VERIFIED WITH: Salome Holmes PHARMD [id number] 08/30/20 A BROWNING    Culture (A)  Final    STAPHYLOCOCCUS CAPITIS THE SIGNIFICANCE OF ISOLATING THIS ORGANISM FROM A SINGLE SET OF BLOOD CULTURES WHEN MULTIPLE SETS ARE DRAWN IS UNCERTAIN. PLEASE NOTIFY THE MICROBIOLOGY DEPARTMENT WITHIN ONE WEEK IF SPECIATION AND SENSITIVITIES ARE REQUIRED. Performed at [facility], [place] [address] [place], [place] [address]    Report Status 08/31/2020 FINAL  Final  Blood Culture ID Panel (Reflexed)     Status: Abnormal   Collection Time: 08/29/20  4:45 PM  Result Value Ref Range Status   Enterococcus  faecalis NOT DETECTED NOT DETECTED Final   Enterococcus Faecium NOT DETECTED NOT DETECTED Final   Listeria monocytogenes NOT DETECTED NOT DETECTED Final   Staphylococcus species DETECTED (A) NOT DETECTED Final    Comment: CRITICAL RESULT CALLED TO, READ BACK BY AND VERIFIED WITH: Salome Holmes PHARMD [id number] 08/30/20 A BROWNING     Staphylococcus aureus (BCID) NOT DETECTED NOT DETECTED Final   Staphylococcus epidermidis NOT DETECTED NOT DETECTED Final   Staphylococcus lugdunensis NOT DETECTED NOT DETECTED Final   Streptococcus species NOT DETECTED NOT DETECTED Final   Streptococcus agalactiae NOT DETECTED NOT DETECTED Final   Streptococcus pneumoniae NOT DETECTED NOT DETECTED Final   Streptococcus pyogenes NOT DETECTED NOT DETECTED Final   A.calcoaceticus-baumannii NOT DETECTED NOT DETECTED Final   Bacteroides fragilis NOT DETECTED NOT DETECTED Final   Enterobacterales NOT DETECTED NOT DETECTED Final   Enterobacter cloacae complex NOT DETECTED NOT DETECTED Final   Escherichia coli NOT DETECTED NOT DETECTED Final   Klebsiella aerogenes NOT DETECTED NOT DETECTED Final   Klebsiella oxytoca NOT DETECTED NOT DETECTED Final   Klebsiella pneumoniae NOT DETECTED NOT DETECTED Final   Proteus species NOT DETECTED NOT DETECTED Final   Salmonella species NOT DETECTED NOT DETECTED Final   Serratia marcescens NOT DETECTED NOT DETECTED Final   Haemophilus influenzae NOT DETECTED NOT DETECTED Final   Neisseria meningitidis NOT DETECTED NOT DETECTED Final   Pseudomonas aeruginosa NOT DETECTED NOT DETECTED Final   Stenotrophomonas maltophilia NOT DETECTED NOT DETECTED Final   Candida albicans NOT DETECTED NOT DETECTED Final   Candida auris NOT DETECTED NOT DETECTED Final   Candida glabrata NOT DETECTED NOT DETECTED Final   Candida krusei NOT DETECTED NOT DETECTED Final   Candida parapsilosis NOT DETECTED NOT DETECTED Final   Candida tropicalis NOT DETECTED NOT DETECTED Final   Cryptococcus neoformans/gattii NOT DETECTED NOT DETECTED Final    Comment: Performed at [facility], [address], [place], [place] [address]  Resp Panel by RT-PCR (Flu A&B, Covid) Nasopharyngeal Swab     Status: None   Collection Time: 08/29/20  5:03 PM   Specimen: Nasopharyngeal Swab; Nasopharyngeal(NP) swabs in vial transport medium  Result  Value Ref Range Status   SARS Coronavirus 2 by RT PCR NEGATIVE NEGATIVE Final    Comment: (NOTE) SARS-CoV-2 target nucleic acids are NOT DETECTED.  The SARS-CoV-2 RNA is generally detectable in upper respiratory specimens during the acute phase of infection. The lowest concentration of SARS-CoV-2 viral copies this assay can detect is 138 copies/mL. A negative result does not preclude SARS-Cov-2 infection and should not be used as the sole basis for treatment or other patient management decisions. A negative result may occur with  improper specimen collection/handling, submission of specimen other than nasopharyngeal swab, presence of viral mutation(s) within the areas targeted by this assay, and inadequate number of viral copies(<138 copies/mL). A negative result must be combined with clinical observations, patient history, and epidemiological information. The expected result is Negative.  Fact Sheet for Patients:  EntrepreneurPulse.com.au  Fact Sheet for Healthcare Providers:  IncredibleEmployment.be  This test is no t yet approved or cleared by the [place] FDA and  has been authorized for detection and/or diagnosis of SARS-CoV-2 by FDA under an Emergency Use Authorization (EUA). This EUA will remain  in effect (meaning this test can be used) for the duration of the COVID-19 declaration under Section 564(b)(1) of the Act, 21 U.S.C.section 360bbb-3(b)(1), unless the authorization is terminated  or revoked sooner.  Influenza A by PCR NEGATIVE NEGATIVE Final   Influenza B by PCR NEGATIVE NEGATIVE Final    Comment: (NOTE) The Xpert Xpress SARS-CoV-2/FLU/RSV plus assay is intended as an aid in the diagnosis of influenza from Nasopharyngeal swab specimens and should not be used as a sole basis for treatment. Nasal washings and aspirates are unacceptable for Xpert Xpress SARS-CoV-2/FLU/RSV testing.  Fact Sheet for  Patients: EntrepreneurPulse.com.au  Fact Sheet for Healthcare Providers: IncredibleEmployment.be  This test is not yet approved or cleared by the [place] FDA and has been authorized for detection and/or diagnosis of SARS-CoV-2 by FDA under an Emergency Use Authorization (EUA). This EUA will remain in effect (meaning this test can be used) for the duration of the COVID-19 declaration under Section 564(b)(1) of the Act, 21 U.S.C. section 360bbb-3(b)(1), unless the authorization is terminated or revoked.  Performed at [facility], [place] [address] [place], [place] [address]   Culture, blood (Routine X 2) w Reflex to ID Panel     Status: None (Preliminary result)   Collection Time: 08/30/20  2:17 AM   Specimen: BLOOD LEFT FOREARM  Result Value Ref Range Status   Specimen Description BLOOD LEFT FOREARM  Final   Special Requests   Final    BOTTLES DRAWN AEROBIC AND ANAEROBIC Blood Culture results may not be optimal due to an inadequate volume of blood received in culture bottles   Culture   Final    NO GROWTH 1 DAY Performed at [facility], [place] [address], [place], [place] [address]    Report Status PENDING  Incomplete     Radiology Studies: DG Abd Portable 1V  Result Date: 08/31/2020 CLINICAL DATA:  Abdominal distension with nausea and vomiting. EXAM: PORTABLE ABDOMEN - 1 VIEW COMPARISON:  CT abdomen pelvis dated Aug 29, 2020. FINDINGS: Percutaneous gastrostomy tube again noted. Progressive diffuse gaseous distention of small bowel and colon. No radio-opaque calculi or other significant radiographic abnormality are seen. IMPRESSION: 1. Findings suggestive of ileus. Electronically Signed   By: Titus Dubin M.D.   On: 08/31/2020 17:50    Marzetta Board, MD, PhD [facility]  Between 7 am - 7 pm I am available, please contact me via Amion (for emergencies) or Securechat (non urgent messages)  Between 7 pm - 7 am I am not  available, please contact night coverage MD/APP via Amion

## 2020-09-02 ENCOUNTER — Inpatient Hospital Stay (HOSPITAL_COMMUNITY): Payer: Medicaid Other

## 2020-09-02 DIAGNOSIS — J9611 Chronic respiratory failure with hypoxia: Secondary | ICD-10-CM | POA: Diagnosis not present

## 2020-09-02 DIAGNOSIS — R6521 Severe sepsis with septic shock: Secondary | ICD-10-CM | POA: Diagnosis not present

## 2020-09-02 DIAGNOSIS — R1084 Generalized abdominal pain: Secondary | ICD-10-CM | POA: Diagnosis not present

## 2020-09-02 LAB — BASIC METABOLIC PANEL
Anion gap: 9 (ref 5–15)
BUN: 26 mg/dL — ABNORMAL HIGH (ref 6–20)
CO2: 29 mmol/L (ref 22–32)
Calcium: 10.6 mg/dL — ABNORMAL HIGH (ref 8.9–10.3)
Chloride: 100 mmol/L (ref 98–111)
Creatinine, Ser: 0.79 mg/dL (ref 0.44–1.00)
GFR, Estimated: >60 mL/min (ref >60)
Glucose, Bld: 77 mg/dL (ref 70–99)
Potassium: 5 mmol/L (ref 3.5–5.1)
Sodium: 138 mmol/L (ref 135–145)

## 2020-09-02 LAB — GLUCOSE, CAPILLARY
Glucose-Capillary: 71 mg/dL (ref 70–99)
Glucose-Capillary: 84 mg/dL (ref 70–99)
Glucose-Capillary: 89 mg/dL (ref 70–99)
Glucose-Capillary: 80 mg/dL (ref 70–99)
Glucose-Capillary: 73 mg/dL (ref 70–99)
Glucose-Capillary: 73 mg/dL (ref 70–99)

## 2020-09-02 LAB — CBC
HCT: 25.4 % — ABNORMAL LOW (ref 36.0–46.0)
Hemoglobin: 7.8 g/dL — ABNORMAL LOW (ref 12.0–15.0)
MCH: 25.9 pg — ABNORMAL LOW (ref 26.0–34.0)
MCHC: 30.7 g/dL (ref 30.0–36.0)
MCV: 84.4 fL (ref 80.0–100.0)
Platelets: 242 10*3/uL (ref 150–400)
RBC: 3.01 MIL/uL — ABNORMAL LOW (ref 3.87–5.11)
RDW: 15.9 % — ABNORMAL HIGH (ref 11.5–15.5)
WBC: 6.2 10*3/uL (ref 4.0–10.5)
nRBC: 0 % (ref 0.0–0.2)

## 2020-09-02 MED ORDER — SODIUM CHLORIDE 0.9 % IV BOLUS
1000.0000 mL | Freq: Once | INTRAVENOUS | Status: AC
  Administered 2020-09-02: 1000 mL via INTRAVENOUS

## 2020-09-02 MED ORDER — DEXTROSE IN LACTATED RINGERS 5 % IV SOLN
INTRAVENOUS | Status: DC
  Administered 2020-09-04: 75 mL/h via INTRAVENOUS

## 2020-09-02 NOTE — Progress Notes (Signed)
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  Apparently had an episode of desaturation at 85% on home vent.  She has had multiple recent hospitalizations for mucous plugging event resulting in respiratory arrest, followed by pulmonology as an outpatient in the trach clinic.  Recent cuffed 6.0 trach replacement 4/27.  In the ED she was febrile 38.1 WBC 23.7, concern for sepsis.  She was started on Vanco and Zosyn.  In the ED RT removed several mucous plugs  Subjective / 24h Interval events: Abdomen remains quite distended this morning, she is tender.  No nausea or vomiting overnight  Assessment & Plan: Principal Problem Chronic respiratory failure with tracheostomy, chronic mechanical ventilation, severe sepsis with likely pulmonary origin -patient was initially placed on broad-spectrum antibiotics with vancomycin and Zosyn -Blood cultures showing out of 4 bottles of staph species, likely contaminant -Urine cultures with Pseudomonas at 50,000 colonies, pansensitive.  Given lack of urinary symptoms this may be colonizer -Continue IV antibiotics given strict n.p.o. due to ileus -Continue vent support per PCCM  Active Problems Ileus -persistent for a few days, increased abdominal distention today, she is slightly more tender to palpation.  She is afebrile, no WBC.  Continue to keep n.p.o., IV fluids.  I have ordered a CT scan this morning.  Have asked general surgery to see, appreciate input  Oliguria-she has normal renal function, decreased urine output in the last 24 hours.  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 apixaban  5 mg Per Tube BID  . arformoterol  15 mcg Nebulization BID  . budesonide  0.25 mg Nebulization BID  . chlorhexidine gluconate (MEDLINE KIT)  15 mL Mouth Rinse BID  . Chlorhexidine Gluconate Cloth  6 each Topical Daily  . clonazePAM  0.5 mg Per Tube TID  . feeding supplement (OSMOLITE 1.5 CAL)  237 mL Per Tube 5 X Daily  . feeding supplement (PROSource TF)  45 mL Per Tube BID  . free water  180 mL Per Tube 5 X Daily  . ipratropium-albuterol  3 mL Nebulization Q4H  . lactulose  10 g Per Tube Daily  . levothyroxine  50 mcg Per Tube Q0600  . mouth rinse  15 mL Mouth Rinse 10 times per day  . multivitamin with minerals  1 tablet Per Tube Daily  . nutrition supplement (JUVEN)  1 packet Per Tube BID BM  . pantoprazole sodium  40 mg Per Tube Daily  . polyethylene glycol  17 g Per Tube Daily  . revefenacin  175 mcg Nebulization Daily  . rosuvastatin  40 mg Per Tube Daily  . simethicone  40 mg Per Tube Q6H  . venlafaxine  37.5 mg Per Tube BID WC   Continuous Infusions: . sodium chloride Stopped (08/31/20 2128)  . sodium chloride Stopped (08/31/20 1907)  . piperacillin-tazobactam (ZOSYN)  IV 12.5 mL/hr at 09/02/20 0800  . sodium chloride  PRN Meds:.sodium chloride, acetaminophen, bisacodyl, docusate, HYDROmorphone (DILAUDID) injection, ipratropium-albuterol, metoCLOPramide, ondansetron (ZOFRAN) IV, phenol, temazepam  Diet Orders (From admission, onward)    Start     Ordered   08/31/20 1450  Diet NPO time specified Except for: Ice Chips  (Undifferentiated presentation (screening labs and basic nursing orders))  Diet effective now        Comments: Small sips water  Question:  Except for  Answer:  Ice Chips   08/31/20 1450          DVT prophylaxis: SCDs Start: 08/29/20 2024 apixaban (ELIQUIS) tablet 5 mg     Code Status: Full Code  Family Communication: no family at bedside   Status is: Inpatient  Remains inpatient appropriate because:Inpatient level of care appropriate due to severity of illness   Dispo: The patient is from: Home              Anticipated d/c is to: Home              Patient currently is not medically stable to d/c.   Difficult to place patient No  Level of care: ICU  Consultants:  PCCM  Procedures:  none  Microbiology  Cultures 5/11 - pending  Antimicrobials: Vancomycin / Zosyn 5/11 >>    Objective: Vitals:   09/02/20 0700 09/02/20 0727 09/02/20 0743 09/02/20 0800  BP: (!) 165/100   (!) 188/87  Pulse: 84   (!) 102  Resp: 16   16  Temp:  (!) 97.5 F (36.4 C)    TempSrc:  Oral    SpO2: 100%  100% 100%  Weight:      Height:        Intake/Output Summary (Last 24 hours) at 09/02/2020 1035 Last data filed at 09/02/2020 0800 Gross per 24 hour  Intake 1815.76 ml  Output 600 ml  Net 1215.76 ml   Filed Weights   08/31/20 0500 09/01/20 0500 09/02/20 0154  Weight: 53.3 kg 54 kg 57.9 kg    Examination:  Constitutional: No apparent distress, appears uncomfortable Eyes: No icterus ENMT: MMM Neck: normal, supple Respiratory: Lungs are clear, breathing with the vent Cardiovascular: Regular rate and rhythm, no murmurs, no edema Abdomen: Distended, tender to palpation throughout, no guarding.  PEG tube in place Musculoskeletal: no clubbing / cyanosis.  Skin: No rashes Neurologic: Nonfocal  Data Reviewed: I have independently reviewed following labs and imaging studies   CBC: Recent Labs  Lab 08/29/20 1645 08/29/20 1654 08/29/20 2230 08/30/20 0901 08/31/20 0051 09/01/20 0034 09/02/20 0239  WBC 23.7*  --   --  15.0* 8.6 9.3 6.2  NEUTROABS 20.0*  --   --  12.5*   --   --   --   HGB 11.0*   < > 10.5* 9.8* 8.2* 8.4* 7.8*  HCT 37.9   < > 31.0* 33.1* 27.5* 28.1* 25.4*  MCV 88.1  --   --  87.3 86.2 86.2 84.4  PLT 438*  --   --  363 245 275 242   < > = values in this interval not displayed.   Basic Metabolic Panel: Recent Labs  Lab 08/29/20 1645 08/29/20 1654 08/29/20 2230 08/30/20 0901 08/31/20 0051 09/01/20 0034 09/01/20 1200 09/02/20 0239  NA 137 137 137 137 137 143  --  138  K 4.2 4.3 4.2 4.0 3.2* 3.8  --  5.0  CL 96* 98  --  97* 98 104  --  100  CO2 31  --   --  30  32 30  --  29  GLUCOSE 184* 188*  --  114* 130* 123*  --  77  BUN 14 17  --  19 17 23*  --  26*  CREATININE 0.96 0.90  --  1.13* 0.92 0.81  --  0.79  CALCIUM 10.2  --   --  10.0 9.3 9.9  --  10.6*  MG  --   --   --  1.7 1.7  --  2.2  --   PHOS  --   --   --  6.1*  --   --   --   --    Liver Function Tests: Recent Labs  Lab 08/29/20 1645 09/01/20 0034  AST 26 27  ALT 14 19  ALKPHOS 84 57  BILITOT 0.7 0.8  PROT 8.6* 7.0  ALBUMIN 3.1* 2.6*   Coagulation Profile: Recent Labs  Lab 08/29/20 1645  INR 1.4*   HbA1C: No results for input(s): HGBA1C in the last 72 hours. CBG: Recent Labs  Lab 09/01/20 1515 09/01/20 1957 09/01/20 2328 09/02/20 0352 09/02/20 0703  GLUCAP 127* 115* 81 71 84    Recent Results (from the past 240 hour(s))  MRSA PCR Screening     Status: None   Collection Time: 08/29/20  4:55 AM   Specimen: Nasal Mucosa; Nasopharyngeal  Result Value Ref Range Status   MRSA by PCR NEGATIVE NEGATIVE Final    Comment:        The GeneXpert MRSA Assay (FDA approved for NASAL specimens only), is one component of a comprehensive MRSA colonization surveillance program. It is not intended to diagnose MRSA infection nor to guide or monitor treatment for MRSA infections. Performed at [facility], [place] [address], [place], [place] [address]   Urine culture     Status: Abnormal   Collection Time: 08/29/20  4:45 PM   Specimen: In/Out Cath Urine   Result Value Ref Range Status   Specimen Description IN/OUT CATH URINE  Final   Special Requests   Final    NONE Performed at [facility], [place] [address], [place], [place] [address]    Culture 50,000 COLONIES/mL PSEUDOMONAS AERUGINOSA (A)  Final   Report Status 08/31/2020 FINAL  Final   Organism ID, Bacteria PSEUDOMONAS AERUGINOSA (A)  Final      Susceptibility   Pseudomonas aeruginosa - MIC*    CEFTAZIDIME <=1 SENSITIVE Sensitive     CIPROFLOXACIN <=0.25 SENSITIVE Sensitive     GENTAMICIN <=1 SENSITIVE Sensitive     IMIPENEM 2 SENSITIVE Sensitive     PIP/TAZO <=4 SENSITIVE Sensitive     CEFEPIME 2 SENSITIVE Sensitive     * 50,000 COLONIES/mL PSEUDOMONAS AERUGINOSA  Blood Culture (routine x 2)     Status: Abnormal   Collection Time: 08/29/20  4:45 PM   Specimen: BLOOD  Result Value Ref Range Status   Specimen Description BLOOD LEFT ANTECUBITAL  Final   Special Requests   Final    BOTTLES DRAWN AEROBIC AND ANAEROBIC Blood Culture adequate volume   Culture  Setup Time   Final    GRAM POSITIVE COCCI IN CLUSTERS AEROBIC BOTTLE ONLY CRITICAL RESULT CALLED TO, READ BACK BY AND VERIFIED WITH: Salome Holmes PHARMD [id number] 08/30/20 A BROWNING    Culture (A)  Final    STAPHYLOCOCCUS CAPITIS THE SIGNIFICANCE OF ISOLATING THIS ORGANISM FROM A SINGLE SET OF BLOOD CULTURES WHEN MULTIPLE SETS ARE DRAWN IS UNCERTAIN. PLEASE NOTIFY THE MICROBIOLOGY DEPARTMENT WITHIN ONE WEEK IF SPECIATION  AND SENSITIVITIES ARE REQUIRED. Performed at [facility], [place] [address], [place], [place] [address]    Report Status 08/31/2020 FINAL  Final  Blood Culture ID Panel (Reflexed)     Status: Abnormal   Collection Time: 08/29/20  4:45 PM  Result Value Ref Range Status   Enterococcus faecalis NOT DETECTED NOT DETECTED Final   Enterococcus Faecium NOT DETECTED NOT DETECTED Final   Listeria monocytogenes NOT DETECTED NOT DETECTED Final   Staphylococcus species DETECTED (A) NOT DETECTED Final    Comment:  CRITICAL RESULT CALLED TO, READ BACK BY AND VERIFIED WITH: Salome Holmes PHARMD [id number] 08/30/20 A BROWNING    Staphylococcus aureus (BCID) NOT DETECTED NOT DETECTED Final   Staphylococcus epidermidis NOT DETECTED NOT DETECTED Final   Staphylococcus lugdunensis NOT DETECTED NOT DETECTED Final   Streptococcus species NOT DETECTED NOT DETECTED Final   Streptococcus agalactiae NOT DETECTED NOT DETECTED Final   Streptococcus pneumoniae NOT DETECTED NOT DETECTED Final   Streptococcus pyogenes NOT DETECTED NOT DETECTED Final   A.calcoaceticus-baumannii NOT DETECTED NOT DETECTED Final   Bacteroides fragilis NOT DETECTED NOT DETECTED Final   Enterobacterales NOT DETECTED NOT DETECTED Final   Enterobacter cloacae complex NOT DETECTED NOT DETECTED Final   Escherichia coli NOT DETECTED NOT DETECTED Final   Klebsiella aerogenes NOT DETECTED NOT DETECTED Final   Klebsiella oxytoca NOT DETECTED NOT DETECTED Final   Klebsiella pneumoniae NOT DETECTED NOT DETECTED Final   Proteus species NOT DETECTED NOT DETECTED Final   Salmonella species NOT DETECTED NOT DETECTED Final   Serratia marcescens NOT DETECTED NOT DETECTED Final   Haemophilus influenzae NOT DETECTED NOT DETECTED Final   Neisseria meningitidis NOT DETECTED NOT DETECTED Final   Pseudomonas aeruginosa NOT DETECTED NOT DETECTED Final   Stenotrophomonas maltophilia NOT DETECTED NOT DETECTED Final   Candida albicans NOT DETECTED NOT DETECTED Final   Candida auris NOT DETECTED NOT DETECTED Final   Candida glabrata NOT DETECTED NOT DETECTED Final   Candida krusei NOT DETECTED NOT DETECTED Final   Candida parapsilosis NOT DETECTED NOT DETECTED Final   Candida tropicalis NOT DETECTED NOT DETECTED Final   Cryptococcus neoformans/gattii NOT DETECTED NOT DETECTED Final    Comment: Performed at [facility], [address], [place], [place] [address]  Resp Panel by RT-PCR (Flu A&B, Covid) Nasopharyngeal Swab     Status: None   Collection Time: 08/29/20   5:03 PM   Specimen: Nasopharyngeal Swab; Nasopharyngeal(NP) swabs in vial transport medium  Result Value Ref Range Status   SARS Coronavirus 2 by RT PCR NEGATIVE NEGATIVE Final    Comment: (NOTE) SARS-CoV-2 target nucleic acids are NOT DETECTED.  The SARS-CoV-2 RNA is generally detectable in upper respiratory specimens during the acute phase of infection. The lowest concentration of SARS-CoV-2 viral copies this assay can detect is 138 copies/mL. A negative result does not preclude SARS-Cov-2 infection and should not be used as the sole basis for treatment or other patient management decisions. A negative result may occur with  improper specimen collection/handling, submission of specimen other than nasopharyngeal swab, presence of viral mutation(s) within the areas targeted by this assay, and inadequate number of viral copies(<138 copies/mL). A negative result must be combined with clinical observations, patient history, and epidemiological information. The expected result is Negative.  Fact Sheet for Patients:  EntrepreneurPulse.com.au  Fact Sheet for Healthcare Providers:  IncredibleEmployment.be  This test is no t yet approved or cleared by the [place] FDA and  has been authorized for detection  and/or diagnosis of SARS-CoV-2 by FDA under an Emergency Use Authorization (EUA). This EUA will remain  in effect (meaning this test can be used) for the duration of the COVID-19 declaration under Section 564(b)(1) of the Act, 21 U.S.C.section 360bbb-3(b)(1), unless the authorization is terminated  or revoked sooner.       Influenza A by PCR NEGATIVE NEGATIVE Final   Influenza B by PCR NEGATIVE NEGATIVE Final    Comment: (NOTE) The Xpert Xpress SARS-CoV-2/FLU/RSV plus assay is intended as an aid in the diagnosis of influenza from Nasopharyngeal swab specimens and should not be used as a sole basis for treatment. Nasal washings and aspirates  are unacceptable for Xpert Xpress SARS-CoV-2/FLU/RSV testing.  Fact Sheet for Patients: EntrepreneurPulse.com.au  Fact Sheet for Healthcare Providers: IncredibleEmployment.be  This test is not yet approved or cleared by the [place] FDA and has been authorized for detection and/or diagnosis of SARS-CoV-2 by FDA under an Emergency Use Authorization (EUA). This EUA will remain in effect (meaning this test can be used) for the duration of the COVID-19 declaration under Section 564(b)(1) of the Act, 21 U.S.C. section 360bbb-3(b)(1), unless the authorization is terminated or revoked.  Performed at [facility], [place] [address] [place], [place] [address]   Culture, blood (Routine X 2) w Reflex to ID Panel     Status: None (Preliminary result)   Collection Time: 08/30/20  2:17 AM   Specimen: BLOOD LEFT FOREARM  Result Value Ref Range Status   Specimen Description BLOOD LEFT FOREARM  Final   Special Requests   Final    BOTTLES DRAWN AEROBIC AND ANAEROBIC Blood Culture results may not be optimal due to an inadequate volume of blood received in culture bottles   Culture   Final    NO GROWTH 2 DAYS Performed at [facility], [place] [address], [place], [place] [address]    Report Status PENDING  Incomplete     Radiology Studies: No results found.  Marzetta Board, MD, PhD [facility]  Between 7 am - 7 pm I am available, please contact me via Amion (for emergencies) or Securechat (non urgent messages)  Between 7 pm - 7 am I am not available, please contact night coverage MD/APP via Amion

## 2020-09-02 NOTE — Consult Note (Addendum)
[facility] Consult Note  Kathleen Cameron 1966-02-29  [id number].    Requesting MD: Cruzita Lederer, MD Chief Complaint/Reason for Consult: abdominal pain, ileus   HPI:  55 y/o F with multiple medical problems including but not limited to HTN, hypothyroidism, seizures, and chronic respiratory failure on trach dependent home vent who presented to the hospital 08/29/20 from home with cc tachypnea/hypoxia per home RN. In ED noted to be febrile with a leukocytosis with suspected respiratory source. Multiple recent hospital admissions for acute on chronic respiratory failure and mucous plugging resulting in respiratory arrest. Our [facility] practice saw the patient in January of this year during one of [facility] stays [facility] after [facility] for respiratory failure and placed a laparoscopic G-tube.   During this admission she has had abdominal distention, KUB consistent with ileus, and some nausea/emesis. General surgery has been asked to see. According to the husband the patient has a bowel movement every 1-2 days, occasionally takes Senakot.   ROS: Review of Systems  Constitutional: Positive for fever.  Gastrointestinal: Positive for abdominal pain and constipation.  All other systems reviewed and are negative.   Family History  Family history unknown: Yes    Past Medical History:  Diagnosis Date  . Acute on chronic respiratory failure with hypoxia ([place])   . Anxiety disorder   . Asthma   . COPD (chronic obstructive pulmonary disease) ([place])   . COPD, severe ([place])   . Hypertension   . Seizure disorder [facility])     Past Surgical History:  Procedure Laterality Date  . COLONOSCOPY N/A 06/26/2020   Procedure: COLONOSCOPY;  Surgeon: Carol Ada, MD;  Location: [place];  Service: Endoscopy;  Laterality: N/A;  . ENTEROSCOPY N/A 06/26/2020   Procedure: ENTEROSCOPY;  Surgeon: Carol Ada, MD;  Location: [place];  Service: Endoscopy;  Laterality: N/A;  . HEMOSTASIS CLIP PLACEMENT   06/26/2020   Procedure: HEMOSTASIS CLIP PLACEMENT;  Surgeon: Carol Ada, MD;  Location: [place];  Service: Endoscopy;;  . HOT HEMOSTASIS N/A 06/26/2020   Procedure: HOT HEMOSTASIS (ARGON PLASMA COAGULATION/BICAP);  Surgeon: Carol Ada, MD;  Location: [place];  Service: Endoscopy;  Laterality: N/A;  . IR FLUORO RM 30-60 MIN  03/23/2020  . LAPAROSCOPIC INSERTION GASTROSTOMY TUBE N/A 04/24/2020   Procedure: LAPAROSCOPIC GASTROSTOMY TUBE PLACEMENT;  Surgeon: Kieth Brightly Arta Bruce, MD;  Location: [place];  Service: General;  Laterality: N/A;  . POLYPECTOMY  06/26/2020   Procedure: POLYPECTOMY;  Surgeon: Carol Ada, MD;  Location: [place];  Service: Endoscopy;;  . WISDOM TOOTH EXTRACTION      Social History:  reports that she has never smoked. She has never used smokeless tobacco. She reports current alcohol use of about 2.0 standard drinks of alcohol per week. She reports that she does not use drugs.  Allergies:  Allergies  Allergen Reactions  . Stiolto Respimat [Tiotropium Bromide-Olodaterol] Other (See Comments)    Severe headaches and rapid heartrate    Medications Prior to Admission  Medication Sig Dispense Refill  . amLODipine (NORVASC) 5 MG tablet Take 1 tablet (5 mg total) by mouth daily. 90 tablet 3  . apixaban (ELIQUIS) 5 MG TABS tablet Place 1 tablet (5 mg total) into feeding tube 2 (two) times daily. 240 tablet 0  . arformoterol (BROVANA) 15 MCG/2ML NEBU Take 2 mLs (15 mcg total) by nebulization 2 (two) times daily. 120 mL 3  . bisacodyl (DULCOLAX) 10 MG suppository Place 1 suppository (10 mg total) rectally daily as needed for severe constipation. 12 suppository 0  .  budesonide (PULMICORT) 0.25 MG/2ML nebulizer solution Take 2 mLs (0.25 mg total) by nebulization 2 (two) times daily. 60 mL 12  . chlorhexidine gluconate, MEDLINE KIT, (PERIDEX) 0.12 % solution 15 mLs by Mouth Rinse route 2 (two) times daily. 120 mL 2  . clonazePAM (KLONOPIN) 1 MG tablet Place 1/2 tablet  (0.5 mg total) into feeding tube 3 (three) times daily as needed for anxiety. 12 tablet 0  . ipratropium-albuterol (DUONEB) 0.5-2.5 (3) MG/3ML SOLN Take 3 mLs by nebulization every 4 (four) hours.    [name] levothyroxine (SYNTHROID) 50 MCG tablet Take 1 tablet (50 mcg total) by mouth daily at 6 (six) AM. 90 tablet 3  . lip balm (CARMEX) ointment Apply topically as needed for lip care. 7 g 0  . metoCLOPramide (REGLAN) 5 MG/5ML solution Place 10 mLs (10 mg total) into feeding tube in the morning and at bedtime. 473 mL 1  . Multiple Vitamin (MULTIVITAMIN WITH MINERALS) TABS tablet Place 1 tablet into feeding tube daily. 90 tablet 0  . Nutritional Supplements (ENSURE ACTIVE HIGH PROTEIN) LIQD Take 1 Can by mouth in the morning and at bedtime.    . Nutritional Supplements (FEEDING SUPPLEMENT, OSMOLITE 1.2 CAL,) LIQD Place 1,000 mLs into feeding tube continuous.  0  . oxyCODONE-acetaminophen (PERCOCET/ROXICET) 5-325 MG tablet PLACE 1 TABLET INTO FEEDING TUBE EVERY EIGHT HOURS AS NEEDED FOR UP TO 3 DAYS FOR SEVERE PAIN. (Patient taking differently: Take 1 tablet by mouth every 6 (six) hours as needed for moderate pain.) 9 tablet 0  . pantoprazole sodium (PROTONIX) 40 mg/20 mL PACK Place 20 mLs (40 mg total) into feeding tube at bedtime. 30 mL   . phenol (CHLORASEPTIC) 1.4 % LIQD Use as directed 1 spray in the mouth or throat as needed for throat irritation / pain.  0  . polyethylene glycol (MIRALAX / GLYCOLAX) 17 g packet Place 17 g into feeding tube daily as needed for mild constipation. 14 each 0  . revefenacin (YUPELRI) 175 MCG/3ML nebulizer solution Take 3 mLs (175 mcg total) by nebulization daily. 90 mL 3  . rosuvastatin (CRESTOR) 40 MG tablet Place 1 tablet (40 mg total) into feeding tube daily.    [name] senna-docusate (SENOKOT-S) 8.6-50 MG tablet Place 2 tablets into feeding tube at bedtime as needed for moderate constipation.    . sodium chloride (OCEAN) 0.65 % SOLN nasal spray Place 1 spray into both  nostrils as needed for congestion.  0  . sorbitol 70 % SOLN Place 30 mLs into feeding tube daily as needed for moderate constipation. 473 mL 2  . temazepam (RESTORIL) 15 MG capsule Place 1 capsule (15 mg total) into feeding tube at bedtime as needed for sleep. 30 capsule 1  . venlafaxine (EFFEXOR) 37.5 MG tablet Place 1 tablet (37.5 mg total) into feeding tube 2 (two) times daily with a meal. 180 tablet 1  . Chlorhexidine Gluconate Cloth 2 % PADS Apply 6 each topically daily. 6 each 5    Blood pressure (!) 136/98, pulse (!) 102, temperature (!) 97.5 F (36.4 C), temperature source Oral, resp. rate (!) 28, height $RemoveBe'5\' 1"'KRwlhLIar$  (1.549 m), weight 57.9 kg, SpO2 100 %. Physical Exam: Constitutional: NAD; appears older than stated age Eyes: Moist conjunctiva; no lid lag; anicteric; PERRL Neck: on vent support via tracheostomy tube Lungs: ventilated respirations, no tactile fremitus  CV: RRR; no palpable thrills; no pitting edema GI: Abd soft, mild distention, previous laparoscopic port sites in R upper quadrant appear well-healing, G-tube to gravity. Hypoactive bowel  sounds, no facial grimace to light palpation of the abdomen, no peritonitis. MSK: symmetricalt; no clubbing/cyanosis Psychiatric: unable to assess Lymphatic: No palpable cervical or axillary lymphadenopathy  Results for orders placed or performed during the hospital encounter of 08/29/20 (from the past 48 hour(s))  Glucose, capillary     Status: Abnormal   Collection Time: 08/31/20 11:45 AM  Result Value Ref Range   Glucose-Capillary 111 (H) 70 - 99 mg/dL    Comment: Glucose reference range applies only to samples taken after fasting for at least 8 hours.  Glucose, capillary     Status: None   Collection Time: 08/31/20  3:28 PM  Result Value Ref Range   Glucose-Capillary 95 70 - 99 mg/dL    Comment: Glucose reference range applies only to samples taken after fasting for at least 8 hours.  Glucose, capillary     Status: None    Collection Time: 08/31/20  7:15 PM  Result Value Ref Range   Glucose-Capillary 86 70 - 99 mg/dL    Comment: Glucose reference range applies only to samples taken after fasting for at least 8 hours.  Glucose, capillary     Status: Abnormal   Collection Time: 08/31/20 11:09 PM  Result Value Ref Range   Glucose-Capillary 113 (H) 70 - 99 mg/dL    Comment: Glucose reference range applies only to samples taken after fasting for at least 8 hours.  CBC     Status: Abnormal   Collection Time: 09/01/20 12:34 AM  Result Value Ref Range   WBC 9.3 4.0 - 10.5 K/uL   RBC 3.26 (L) 3.87 - 5.11 MIL/uL   Hemoglobin 8.4 (L) 12.0 - 15.0 g/dL   HCT 28.1 (L) 36.0 - 46.0 %   MCV 86.2 80.0 - 100.0 fL   MCH 25.8 (L) 26.0 - 34.0 pg   MCHC 29.9 (L) 30.0 - 36.0 g/dL   RDW 15.7 (H) 11.5 - 15.5 %   Platelets 275 150 - 400 K/uL   nRBC 0.0 0.0 - 0.2 %    Comment: Performed at [facility] [address], [place], [place] [address]  Comprehensive metabolic panel     Status: Abnormal   Collection Time: 09/01/20 12:34 AM  Result Value Ref Range   Sodium 143 135 - 145 mmol/L   Potassium 3.8 3.5 - 5.1 mmol/L   Chloride 104 98 - 111 mmol/L   CO2 30 22 - 32 mmol/L   Glucose, Bld 123 (H) 70 - 99 mg/dL    Comment: Glucose reference range applies only to samples taken after fasting for at least 8 hours.   BUN 23 (H) 6 - 20 mg/dL   Creatinine, Ser 0.81 0.44 - 1.00 mg/dL   Calcium 9.9 8.9 - 10.3 mg/dL   Total Protein 7.0 6.5 - 8.1 g/dL   Albumin 2.6 (L) 3.5 - 5.0 g/dL   AST 27 15 - 41 U/L   ALT 19 0 - 44 U/L   Alkaline Phosphatase 57 38 - 126 U/L   Total Bilirubin 0.8 0.3 - 1.2 mg/dL   GFR, Estimated >60 >60 mL/min    Comment: (NOTE) Calculated using the CKD-EPI Creatinine Equation (2021)    Anion gap 9 5 - 15    Comment: Performed at [facility] [address] [place], [place] [address]  Glucose, capillary     Status: Abnormal   Collection Time: 09/01/20  3:07 AM  Result Value Ref Range    Glucose-Capillary 114 (H) 70 -  99 mg/dL    Comment: Glucose reference range applies only to samples taken after fasting for at least 8 hours.  Glucose, capillary     Status: Abnormal   Collection Time: 09/01/20  7:11 AM  Result Value Ref Range   Glucose-Capillary 110 (H) 70 - 99 mg/dL    Comment: Glucose reference range applies only to samples taken after fasting for at least 8 hours.  Glucose, capillary     Status: Abnormal   Collection Time: 09/01/20 10:57 AM  Result Value Ref Range   Glucose-Capillary 117 (H) 70 - 99 mg/dL    Comment: Glucose reference range applies only to samples taken after fasting for at least 8 hours.  Magnesium     Status: None   Collection Time: 09/01/20 12:00 PM  Result Value Ref Range   Magnesium 2.2 1.7 - 2.4 mg/dL    Comment: Performed at [facility], [place] [address], [place], [place] [address]  Glucose, capillary     Status: Abnormal   Collection Time: 09/01/20  3:15 PM  Result Value Ref Range   Glucose-Capillary 127 (H) 70 - 99 mg/dL    Comment: Glucose reference range applies only to samples taken after fasting for at least 8 hours.  Glucose, capillary     Status: Abnormal   Collection Time: 09/01/20  7:57 PM  Result Value Ref Range   Glucose-Capillary 115 (H) 70 - 99 mg/dL    Comment: Glucose reference range applies only to samples taken after fasting for at least 8 hours.  Glucose, capillary     Status: None   Collection Time: 09/01/20 11:28 PM  Result Value Ref Range   Glucose-Capillary 81 70 - 99 mg/dL    Comment: Glucose reference range applies only to samples taken after fasting for at least 8 hours.  Basic metabolic panel     Status: Abnormal   Collection Time: 09/02/20  2:39 AM  Result Value Ref Range   Sodium 138 135 - 145 mmol/L   Potassium 5.0 3.5 - 5.1 mmol/L    Comment: SLIGHT HEMOLYSIS   Chloride 100 98 - 111 mmol/L   CO2 29 22 - 32 mmol/L   Glucose, Bld 77 70 - 99 mg/dL    Comment: Glucose reference range applies only to  samples taken after fasting for at least 8 hours.   BUN 26 (H) 6 - 20 mg/dL   Creatinine, Ser 0.79 0.44 - 1.00 mg/dL   Calcium 10.6 (H) 8.9 - 10.3 mg/dL   GFR, Estimated >60 >60 mL/min    Comment: (NOTE) Calculated using the CKD-EPI Creatinine Equation (2021)    Anion gap 9 5 - 15    Comment: Performed at [facility] [address], [place], [place] [address]  CBC     Status: Abnormal   Collection Time: 09/02/20  2:39 AM  Result Value Ref Range   WBC 6.2 4.0 - 10.5 K/uL   RBC 3.01 (L) 3.87 - 5.11 MIL/uL   Hemoglobin 7.8 (L) 12.0 - 15.0 g/dL   HCT 25.4 (L) 36.0 - 46.0 %   MCV 84.4 80.0 - 100.0 fL   MCH 25.9 (L) 26.0 - 34.0 pg   MCHC 30.7 30.0 - 36.0 g/dL   RDW 15.9 (H) 11.5 - 15.5 %   Platelets 242 150 - 400 K/uL   nRBC 0.0 0.0 - 0.2 %    Comment: Performed at [facility], [place] [address], [place], [place] [address]  Glucose,  capillary     Status: None   Collection Time: 09/02/20  3:52 AM  Result Value Ref Range   Glucose-Capillary 71 70 - 99 mg/dL    Comment: Glucose reference range applies only to samples taken after fasting for at least 8 hours.  Glucose, capillary     Status: None   Collection Time: 09/02/20  7:03 AM  Result Value Ref Range   Glucose-Capillary 84 70 - 99 mg/dL    Comment: Glucose reference range applies only to samples taken after fasting for at least 8 hours.   CT ABDOMEN PELVIS WO CONTRAST  Result Date: 09/02/2020 CLINICAL DATA:  Abdominal distension EXAM: CT ABDOMEN AND PELVIS WITHOUT CONTRAST TECHNIQUE: Multidetector CT imaging of the abdomen and pelvis was performed following the standard protocol without IV contrast. COMPARISON:  08/29/2020 FINDINGS: Lower chest: No acute abnormality. Bandlike scarring and or atelectasis of the included bilateral lung bases, similar to prior examination. Hepatobiliary: No solid liver abnormality is seen. No gallstones, gallbladder wall thickening, or biliary dilatation. Pancreas: Unremarkable. No pancreatic  ductal dilatation or surrounding inflammatory changes. Spleen: Normal in size without significant abnormality. Adrenals/Urinary Tract: Adrenal glands are unremarkable. Kidneys are normal, without renal calculi, solid lesion, or hydronephrosis. Bladder is unremarkable. Stomach/Bowel: Percutaneous gastrostomy tube, tip in the distal body of the stomach. Appendix appears normal. No evidence of bowel wall thickening, distention, or inflammatory changes. Vascular/Lymphatic: Aortic atherosclerosis. No enlarged abdominal or pelvic lymph nodes. Reproductive: No mass or other significant abnormality. Other: No abdominal wall hernia or abnormality. Anasarca. Small volume free fluid in the low pelvis (series 3, image 47). Musculoskeletal: No acute or significant osseous findings. IMPRESSION: 1. No non-contrast CT findings of the abdomen or pelvis to explain abdominal distension. 2. Percutaneous gastrostomy tube, tip in the distal body of the stomach. 3. Small volume free fluid in the low pelvis, nonspecific. 4. Anasarca. Aortic Atherosclerosis (ICD10-I70.0). Electronically Signed   By: Eddie Candle M.D.   On: 09/02/2020 11:07   DG Abd Portable 1V  Result Date: 08/31/2020 CLINICAL DATA:  Abdominal distension with nausea and vomiting. EXAM: PORTABLE ABDOMEN - 1 VIEW COMPARISON:  CT abdomen pelvis dated Aug 29, 2020. FINDINGS: Percutaneous gastrostomy tube again noted. Progressive diffuse gaseous distention of small bowel and colon. No radio-opaque calculi or other significant radiographic abnormality are seen. IMPRESSION: 1. Findings suggestive of ileus. Electronically Signed   By: Titus Dubin M.D.   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 - PMH laparoscopic gastrostomy tube by Dr. Kieth Brightly 04/2020. Tube appears to be in good position on CT scan, no gastric complications noted on CT abdomen from today.  - CT abdomen pelvis today awaiting final read. She has colonic distention in the left hemi-abdomen concerning for colonic ileus. Await final read to rule out volvulus. Would recommend GI consult for possible colonic ileus and consideration of neostigmine.  - no emergent surgical needs. No signs of small bowel obstruction.    Jill Alexanders, PA-C [facility] Please see Amion for pager number during day hours 7:00am-4:30pm 09/02/2020, 11:16 AM

## 2020-09-02 NOTE — Progress Notes (Signed)
09/02/20 11:58 am Dr. Elvera Lennox notified of persistent urinary retention over shift, bladder scan results of 386 mL. Dr. Elvera Lennox instructed this RN to attempt to encourage pt to void and to perform in and out catheterization if no spontaneous void by 13:00 pm.   09/02/20 1315 pm In and out catheterization performed by this RN and Heloise Beecham, RN. During in and out cath, urinary meatus noted to be partially obstructed by swollen tissue. Dr. Elvera Lennox notified, instructed this RN to place foley catheter with dwell time of no more than 48 hours.

## 2020-09-02 NOTE — Progress Notes (Signed)
RT NOTE: patient transported to CT and back to room 2M06 without complications.

## 2020-09-02 NOTE — Progress Notes (Signed)
SLP Cancellation Note  Patient Details Name: Kathleen Cameron MRN: [id number] DOB: 05-13-1965   Cancelled treatment:       Reason Eval/Treat Not Completed: Medical issues which prohibited therapy. Remains NPO due to ileus. Will continue to f/u.   Ferdinand Lango MA, CCC-SLP     [name] 09/02/2020, 8:43 AM

## 2020-09-03 ENCOUNTER — Inpatient Hospital Stay (HOSPITAL_COMMUNITY): Payer: Medicaid Other

## 2020-09-03 DIAGNOSIS — R1084 Generalized abdominal pain: Secondary | ICD-10-CM | POA: Diagnosis not present

## 2020-09-03 DIAGNOSIS — J9611 Chronic respiratory failure with hypoxia: Secondary | ICD-10-CM

## 2020-09-03 DIAGNOSIS — K567 Ileus, unspecified: Secondary | ICD-10-CM

## 2020-09-03 LAB — COMPREHENSIVE METABOLIC PANEL
ALT: 15 U/L (ref 0–44)
AST: 23 U/L (ref 15–41)
Albumin: 2.4 g/dL — ABNORMAL LOW (ref 3.5–5.0)
Alkaline Phosphatase: 56 U/L (ref 38–126)
Anion gap: 9 (ref 5–15)
BUN: 18 mg/dL (ref 6–20)
CO2: 28 mmol/L (ref 22–32)
Calcium: 9.7 mg/dL (ref 8.9–10.3)
Chloride: 101 mmol/L (ref 98–111)
Creatinine, Ser: 0.77 mg/dL (ref 0.44–1.00)
GFR, Estimated: >60 mL/min (ref >60)
Glucose, Bld: 73 mg/dL (ref 70–99)
Potassium: 3.7 mmol/L (ref 3.5–5.1)
Sodium: 138 mmol/L (ref 135–145)
Total Bilirubin: 0.5 mg/dL (ref 0.3–1.2)
Total Protein: 6.4 g/dL — ABNORMAL LOW (ref 6.5–8.1)

## 2020-09-03 LAB — CBC
HCT: 24.6 % — ABNORMAL LOW (ref 36.0–46.0)
Hemoglobin: 7.6 g/dL — ABNORMAL LOW (ref 12.0–15.0)
MCH: 26.2 pg (ref 26.0–34.0)
MCHC: 30.9 g/dL (ref 30.0–36.0)
MCV: 84.8 fL (ref 80.0–100.0)
Platelets: 252 10*3/uL (ref 150–400)
RBC: 2.9 MIL/uL — ABNORMAL LOW (ref 3.87–5.11)
RDW: 16.1 % — ABNORMAL HIGH (ref 11.5–15.5)
WBC: 8 10*3/uL (ref 4.0–10.5)
nRBC: 0 % (ref 0.0–0.2)

## 2020-09-03 LAB — PHOSPHORUS: Phosphorus: 3.4 mg/dL (ref 2.5–4.6)

## 2020-09-03 LAB — MAGNESIUM: Magnesium: 1.8 mg/dL (ref 1.7–2.4)

## 2020-09-03 LAB — GLUCOSE, CAPILLARY
Glucose-Capillary: 82 mg/dL (ref 70–99)
Glucose-Capillary: 80 mg/dL (ref 70–99)
Glucose-Capillary: 92 mg/dL (ref 70–99)
Glucose-Capillary: 102 mg/dL — ABNORMAL HIGH (ref 70–99)
Glucose-Capillary: 99 mg/dL (ref 70–99)
Glucose-Capillary: 93 mg/dL (ref 70–99)

## 2020-09-03 MED ORDER — OSMOLITE 1.5 CAL PO LIQD: 237.0000 mL | Freq: Every day | ORAL | Status: DC

## 2020-09-03 MED ORDER — HYDROMORPHONE HCL 1 MG/ML IJ SOLN
0.5000 mg | INTRAMUSCULAR | Status: DC | PRN
  Administered 2020-09-03 – 2020-09-07 (×5): 0.5 mg via INTRAVENOUS
  Filled 2020-09-03 (×5): qty 0.5

## 2020-09-03 MED ORDER — METOCLOPRAMIDE HCL 5 MG/ML IJ SOLN
10.0000 mg | Freq: Three times a day (TID) | INTRAMUSCULAR | Status: DC
  Administered 2020-09-03 – 2020-09-05 (×6): 10 mg via INTRAVENOUS
  Filled 2020-09-03 (×6): qty 2

## 2020-09-03 MED ORDER — ALBUTEROL SULFATE (2.5 MG/3ML) 0.083% IN NEBU
2.5000 mg | INHALATION_SOLUTION | RESPIRATORY_TRACT | Status: DC | PRN
  Administered 2020-09-03 – 2020-09-14 (×4): 2.5 mg via RESPIRATORY_TRACT
  Administered 2020-09-14: 5 mg via RESPIRATORY_TRACT
  Administered 2020-09-15 – 2020-09-16 (×3): 2.5 mg via RESPIRATORY_TRACT
  Filled 2020-09-03 (×8): qty 3

## 2020-09-03 MED ORDER — METOCLOPRAMIDE HCL 5 MG/5ML PO SOLN
10.0000 mg | Freq: Three times a day (TID) | ORAL | Status: DC
  Administered 2020-09-03: 10 mg
  Filled 2020-09-03: qty 10

## 2020-09-03 MED ORDER — HYDRALAZINE HCL 20 MG/ML IJ SOLN
5.0000 mg | INTRAMUSCULAR | Status: DC | PRN
  Administered 2020-09-04: 5 mg via INTRAVENOUS
  Filled 2020-09-03: qty 1

## 2020-09-03 MED ORDER — MAGNESIUM SULFATE 2 GM/50ML IV SOLN
2.0000 g | Freq: Once | INTRAVENOUS | Status: AC
  Administered 2020-09-03: 2 g via INTRAVENOUS
  Filled 2020-09-03: qty 50

## 2020-09-03 MED ORDER — POTASSIUM CHLORIDE 20 MEQ PO PACK
60.0000 meq | PACK | Freq: Once | ORAL | Status: AC
  Administered 2020-09-03: 60 meq
  Filled 2020-09-03: qty 3

## 2020-09-03 NOTE — Progress Notes (Addendum)
   NAME:  Kathleen Cameron, MRN:  [id number], DOB:  1965/10/04, LOS: 5 ADMISSION DATE:  08/29/2020, CONSULTATION DATE:  08/29/2020 REFERRING MD:  Dr. Allena Katz CHIEF COMPLAINT:  SOB  History of Present Illness:  55 yo female presented to ED with dyspnea and hypoxia with SpO2 85%.  She had fever and found to have mucus plugging.  Started on antibiotics.  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  She is followed in trach clinic [name].  She has hx of respiratory arrest from mucus plugging in the setting of Corynebacterium pneumonia.  Pertinent Medical History:  Anxiety, HTN, Seizures  Significant Hospital Events:  . 5/11 Increasing SOB, presented to [facility] ED, code sepsis called, Pip-tazo>, Vanc> flagyl x1, head ct neg, CT abd pelvis grossly unremarkable, UC> , BC> . 5/12 Anxious overnight, improved with Xanax.  . 5/15 surgery consulted for abdominal pain  Tests:   CT head 5/11 >> atrophy and small vessel ischemic changes  CT abd/pelvis 5/11 >> distended stomach  CT abd/pelvis 5/15 >> no acute findings  Interim History / Subjective:  Had trouble with ileus over the weekend.  Denies chest pain, abdominal pain, nausea.  Objective   Blood pressure (!) 156/94, pulse 95, temperature 98.2 F (36.8 C), temperature source Oral, resp. rate 16, height 5\' 1"  (1.549 m), weight 58.6 kg, SpO2 100 %.    Vent Mode: PRVC FiO2 (%):  [30 %-40 %] 30 % Set Rate:  [16 bmp] 16 bmp Vt Set:  [420 mL] 420 mL PEEP:  [5 cmH20] 5 cmH20 Plateau Pressure:  [17 cmH20-29 cmH20] 20 cmH20   Intake/Output Summary (Last 24 hours) at 09/03/2020 0737 Last data filed at 09/03/2020 0600 Gross per 24 hour  Intake 1446.79 ml  Output 1430 ml  Net 16.79 ml   Filed Weights   09/01/20 0500 09/02/20 0154 09/03/20 0500  Weight: 54 kg 57.9 kg 58.6 kg    Physical Examination:  General - alert Eyes - pupils reactive ENT - trach site clean Cardiac - regular rate/rhythm,  no murmur Chest - decreased breath sounds, no wheezing Abdomen - soft, decreased bowel sounds Extremities - no cyanosis, clubbing, or edema Skin - no rashes Neuro - moves extremities, follows commands Psych - normal mood and behavior  Resolved Hospital Problem list     Assessment & Plan:   Acute on chronic hypoxic respiratory failure from tracheobronchitis with mucus plugging. Chronic trach/vent status. Severe COPD with emphysema. - goal SpO2 90 to 95% - pressure support wean to TC as tolerated - f/u CXR intermittently - mobilize as able - trach care - continue brovana, pulmicort, yupelri - no benefit of using ipratropium while on yupelri; change to prn albuterol - day 6 of ABx, currently on zosyn  Ileus. Hypothyroidism. Seizure disorder. Anxiety. [place] DVT from 06/12/20. Deconditioning. - per primary team  Best practice:  Diet: tube feeds DVT prophylaxis: Eliquis GI prophylaxis: Protonix Mobility: as tolerated Code Status:  full code Disposition: ICU  Updated husband at bedside.  Signature:  06/14/20, MD [facility] Pulmonary/Critical Care Pager - [phone] 09/03/2020, 7:51 AM

## 2020-09-03 NOTE — Progress Notes (Addendum)
SLP Cancellation Note  Patient Details Name: Kathleen Cameron MRN: [id number] DOB: 26-May-1965   Cancelled treatment:       Reason Eval/Treat Not Completed: Patient not medically ready. Still strict NPO due to ileus. Will sign off at this time and await new orders. Dyanne Iha, Riley Nearing 09/03/2020, 10:30 AM

## 2020-09-03 NOTE — Progress Notes (Signed)
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  Apparently had an episode of desaturation at 85% on home vent.  She has had multiple recent hospitalizations for mucous plugging event resulting in respiratory arrest, followed by pulmonology as an outpatient in the trach clinic.  Recent cuffed 6.0 trach replacement 4/27.  In the ED she was febrile 38.1 WBC 23.7, concern for sepsis.  She was started on Vanco and Zosyn.  In the ED RT removed several mucous plugs  Subjective / 24h Interval events: Abdomen remains distended, had an episode of vomiting last night.  Has not had bowel movement yet.  Assessment & Plan: Principal Problem Chronic respiratory failure with tracheostomy, chronic mechanical ventilation, severe sepsis with likely pulmonary origin -patient was initially placed on broad-spectrum antibiotics with vancomycin and Zosyn -Blood cultures showing out of 4 bottles of staph species, likely contaminant -Urine cultures with Pseudomonas at 50,000 colonies, pansensitive.  Given lack of urinary symptoms this may be colonizer -Continue IV antibiotics given strict n.p.o. due to ileus, Zosyn alone, today will complete day #5, plan for total of 7 days -Continue vent support per PCCM  Active Problems Ileus -this is been persistent now for several days, despite conservative measures with n.p.o., IV fluids, G-tube venting she remains distended and quite uncomfortable.  CT scan without acute findings.  General surgery saw yesterday due to increased tenderness.  GI consulted  Acute urinary retention-she she was noted to have significant retention yesterday, required several in and out caths, eventually Foley placed on 5/15.   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 apixaban  5 mg Per Tube BID  . arformoterol  15 mcg Nebulization BID  . budesonide  0.25 mg Nebulization BID  . chlorhexidine gluconate (MEDLINE KIT)  15 mL Mouth Rinse BID  . Chlorhexidine Gluconate Cloth  6 each Topical Daily  . clonazePAM  0.5 mg Per Tube TID  . feeding supplement (OSMOLITE 1.5 CAL)  237 mL Per Tube 5 X Daily  . feeding supplement (PROSource TF)  45 mL Per Tube BID  . free water  180 mL Per Tube 5 X Daily  . lactulose  10 g Per Tube Daily  . levothyroxine  50 mcg Per Tube Q0600  . mouth rinse  15 mL Mouth Rinse 10 times per day  . multivitamin with minerals  1 tablet Per Tube Daily  . nutrition supplement (JUVEN)  1 packet Per Tube BID BM  . pantoprazole sodium  40 mg Per Tube Daily  . polyethylene glycol  17 g Per Tube Daily  . revefenacin  175 mcg Nebulization Daily  . rosuvastatin  40 mg Per Tube Daily  . simethicone  40 mg Per Tube Q6H  . venlafaxine  37.5 mg Per Tube BID WC   Continuous Infusions: . sodium chloride Stopped (08/31/20 2128)  . sodium chloride Stopped (08/31/20 1907)  . dextrose 5% lactated ringers Stopped (09/03/20 0511)  . piperacillin-tazobactam (ZOSYN)  IV 12.5 mL/hr at 09/03/20 0600   PRN Meds:.sodium chloride, acetaminophen, albuterol, bisacodyl, docusate, HYDROmorphone (DILAUDID) injection, metoCLOPramide, ondansetron (ZOFRAN) IV, phenol, temazepam  Diet Orders (From admission, onward)    Start     Ordered   08/31/20 1450  Diet NPO time specified Except for: Ice Chips  (Undifferentiated  presentation (screening labs and basic nursing orders))  Diet effective now       Comments: Small sips water  Question:  Except for  Answer:  Ice Chips   08/31/20 1450         DVT prophylaxis: SCDs Start: 08/29/20 2024 apixaban (ELIQUIS) tablet 5 mg     Code Status: Full Code  Family Communication: no family at bedside   Status is: Inpatient  Remains inpatient appropriate because:Inpatient level of care appropriate due to severity of illness   Dispo: The patient is from: Home              Anticipated d/c is to: Home              Patient currently is not medically stable to d/c.   Difficult to place patient No  Level of care: ICU  Consultants:  PCCM  Procedures:  none  Microbiology  Cultures 5/11 - pending  Antimicrobials: Vancomycin stopped Zosyn 5/11 >>    Objective: Vitals:   09/03/20 0500 09/03/20 0729 09/03/20 0735 09/03/20 0800  BP: (!) 156/94   126/81  Pulse: 96  95 99  Resp: _0 Temp:  98.2 F (36.8 C)    TempSrc:  Oral    SpO2: 100%  99% 100%  Weight: 58.6 kg     Height:        Intake/Output Summary (Last 24 hours) at 09/03/2020 0949 Last data filed at 09/03/2020 0600 Gross per 24 hour  Intake 1283.38 ml  Output 1430 ml  Net -146.62 ml   Filed Weights   09/01/20 0500 09/02/20 0154 09/03/20 0500  Weight: 54 kg 57.9 kg 58.6 kg    Examination:  Constitutional: NAD, appears uncomfortable Eyes: No icterus ENMT: mmm Neck: normal, supple Respiratory: Clear to auscultation, breathing with the vent Cardiovascular: Regular rate and rhythm, no murmurs Abdomen: Remains distended, mildly tender to palpation, no guarding, no rebound.  PEG tube in place Musculoskeletal: no clubbing / cyanosis.  Skin: No rashes seen Neurologic: No focal deficits  Data Reviewed: I have independently reviewed following labs and imaging studies   CBC: Recent Labs  Lab 08/29/20 1645 08/29/20 1654 08/30/20 0901 08/31/20 0051 09/01/20 0034 09/02/20 0239  09/03/20 0140  WBC 23.7*  --  15.0* 8.6 9.3 6.2 8.0  NEUTROABS 20.0*  --  12.5*  --   --   --   --   HGB 11.0*   < > 9.8* 8.2* 8.4* 7.8* 7.6*  HCT 37.9   < > 33.1* 27.5* 28.1* 25.4* 24.6*  MCV 88.1  --  87.3 86.2 86.2 84.4 84.8  PLT 438*  --  363 245 275 242 252   < > = values in this interval not displayed.   Basic Metabolic Panel: Recent Labs  Lab 08/30/20 0901 08/31/20 0051 09/01/20 0034 09/01/20 1200 09/02/20 0239 09/03/20 0140  NA 137 137 143  --  138 138  K 4.0 3.2* 3.8  --  5.0 3.7  CL 97* 98 104  --  100 101  CO2 30 32 30  --  29 28  GLUCOSE 114* 130* 123*  --  77 73  BUN 19 17 23*  --  26* 18  CREATININE 1.13* 0.92 0.81  --  0.79 0.77  CALCIUM 10.0 9.3 9.9  --  10.6* 9.7  MG 1.7 1.7  --  2.2  --  1.8  PHOS 6.1*  --   --   --   --  3.4   Liver Function Tests: Recent Labs  Lab 08/29/20 1645 09/01/20 0034 09/03/20 0140  AST _0 ALT _1 ALKPHOS 84 57 56  BILITOT 0.7 0.8 0.5  PROT 8.6* 7.0 6.4*  ALBUMIN 3.1* 2.6* 2.4*   Coagulation Profile: Recent Labs  Lab 08/29/20 1645  INR 1.4*   HbA1C: No results for input(s): HGBA1C in the last 72 hours. CBG: Recent Labs  Lab 09/02/20 1612 09/02/20 1930 09/02/20 2322 09/03/20 0311 09/03/20 0727  GLUCAP 80 73 73 82 80    Recent Results (from the past 240 hour(s))  MRSA PCR Screening     Status: None   Collection Time: 08/29/20  4:55 AM   Specimen: Nasal Mucosa; Nasopharyngeal  Result Value Ref Range Status   MRSA by PCR NEGATIVE NEGATIVE Final    Comment:        The GeneXpert MRSA Assay (FDA approved for NASAL specimens only), is one component of a comprehensive MRSA colonization surveillance program. It is not intended to diagnose MRSA infection nor to guide or monitor treatment for MRSA infections. Performed at [facility], [place] [address] [place], [place] [address]   Urine culture     Status: Abnormal   Collection Time: 08/29/20  4:45 PM   Specimen: In/Out Cath Urine   Result Value Ref Range Status   Specimen Description IN/OUT CATH URINE  Final   Special Requests   Final    NONE Performed at [facility], [place] [address] [place], [place] [address]    Culture 50,000 COLONIES/mL PSEUDOMONAS AERUGINOSA (A)  Final   Report Status 08/31/2020 FINAL  Final   Organism ID, Bacteria PSEUDOMONAS AERUGINOSA (A)  Final      Susceptibility   Pseudomonas aeruginosa - MIC*    CEFTAZIDIME <=1 SENSITIVE Sensitive     CIPROFLOXACIN <=0.25 SENSITIVE Sensitive     GENTAMICIN <=1 SENSITIVE Sensitive     IMIPENEM 2 SENSITIVE Sensitive     PIP/TAZO <=4 SENSITIVE Sensitive     CEFEPIME 2 SENSITIVE Sensitive     * 50,000 COLONIES/mL PSEUDOMONAS AERUGINOSA  Blood Culture (routine x 2)     Status: Abnormal   Collection Time: 08/29/20  4:45 PM   Specimen: BLOOD  Result Value Ref Range Status   Specimen Description BLOOD LEFT ANTECUBITAL  Final   Special Requests   Final    BOTTLES DRAWN AEROBIC AND ANAEROBIC Blood Culture adequate volume   Culture  Setup Time   Final    GRAM POSITIVE COCCI IN CLUSTERS AEROBIC BOTTLE ONLY CRITICAL RESULT CALLED TO, READ BACK BY AND VERIFIED WITH: Salome Holmes PHARMD [id number] 08/30/20 A BROWNING    Culture (A)  Final    STAPHYLOCOCCUS CAPITIS THE SIGNIFICANCE OF ISOLATING THIS ORGANISM FROM A SINGLE SET OF BLOOD CULTURES WHEN MULTIPLE SETS ARE DRAWN IS UNCERTAIN. PLEASE NOTIFY THE MICROBIOLOGY DEPARTMENT WITHIN ONE WEEK IF SPECIATION AND SENSITIVITIES ARE REQUIRED. Performed at [facility], [place] [address] [place], [place] [address]    Report Status 08/31/2020 FINAL  Final  Blood Culture ID Panel (Reflexed)     Status: Abnormal   Collection Time: 08/29/20  4:45 PM  Result Value Ref Range Status   Enterococcus faecalis NOT DETECTED NOT DETECTED Final   Enterococcus Faecium NOT DETECTED NOT DETECTED Final   Listeria monocytogenes NOT DETECTED NOT DETECTED Final   Staphylococcus species DETECTED (A) NOT DETECTED Final    Comment:  CRITICAL RESULT CALLED TO, READ BACK BY AND VERIFIED WITH: Salome Holmes PHARMD [id number] 08/30/20 A BROWNING    Staphylococcus aureus (BCID) NOT DETECTED NOT DETECTED Final   Staphylococcus epidermidis NOT DETECTED NOT DETECTED Final   Staphylococcus lugdunensis NOT DETECTED NOT DETECTED Final   Streptococcus species NOT DETECTED NOT DETECTED Final   Streptococcus agalactiae NOT DETECTED NOT DETECTED Final   Streptococcus pneumoniae NOT DETECTED NOT DETECTED Final   Streptococcus pyogenes NOT DETECTED NOT DETECTED Final   A.calcoaceticus-baumannii NOT DETECTED NOT DETECTED Final   Bacteroides fragilis NOT DETECTED NOT DETECTED Final   Enterobacterales NOT DETECTED NOT DETECTED Final   Enterobacter cloacae complex NOT DETECTED NOT DETECTED Final   Escherichia coli NOT DETECTED NOT DETECTED Final   Klebsiella aerogenes NOT DETECTED NOT DETECTED Final   Klebsiella oxytoca NOT DETECTED NOT DETECTED Final   Klebsiella pneumoniae NOT DETECTED NOT DETECTED Final   Proteus species NOT DETECTED NOT DETECTED Final   Salmonella species NOT DETECTED NOT DETECTED Final   Serratia marcescens NOT DETECTED NOT DETECTED Final   Haemophilus influenzae NOT DETECTED NOT DETECTED Final   Neisseria meningitidis NOT DETECTED NOT DETECTED Final   Pseudomonas aeruginosa NOT DETECTED NOT DETECTED Final   Stenotrophomonas maltophilia NOT DETECTED NOT DETECTED Final   Candida albicans NOT DETECTED NOT DETECTED Final   Candida auris NOT DETECTED NOT DETECTED Final   Candida glabrata NOT DETECTED NOT DETECTED Final   Candida krusei NOT DETECTED NOT DETECTED Final   Candida parapsilosis NOT DETECTED NOT DETECTED Final   Candida tropicalis NOT DETECTED NOT DETECTED Final   Cryptococcus neoformans/gattii NOT DETECTED NOT DETECTED Final    Comment: Performed at [facility], [address], [place], [place] [address]  Resp Panel by RT-PCR (Flu A&B, Covid) Nasopharyngeal Swab     Status: None   Collection Time: 08/29/20   5:03 PM   Specimen: Nasopharyngeal Swab; Nasopharyngeal(NP) swabs in vial transport medium  Result Value Ref Range Status   SARS Coronavirus 2 by RT PCR NEGATIVE NEGATIVE Final    Comment: (NOTE) SARS-CoV-2 target nucleic acids are NOT DETECTED.  The SARS-CoV-2 RNA is generally detectable in upper respiratory specimens during the acute phase of infection. The lowest concentration of SARS-CoV-2 viral copies this assay can detect is 138 copies/mL. A negative result does not preclude SARS-Cov-2 infection and should not be used as the sole basis for treatment or other patient management decisions. A negative result may occur with  improper specimen collection/handling, submission of specimen other than nasopharyngeal swab, presence of viral mutation(s) within the areas targeted by this assay, and inadequate number of viral copies(<138 copies/mL). A negative result must be combined with clinical observations, patient history, and epidemiological information. The expected result is Negative.  Fact Sheet for Patients:  EntrepreneurPulse.com.au  Fact Sheet for Healthcare Providers:  IncredibleEmployment.be  This test is no t yet approved or cleared by the [place] FDA and  has been authorized for detection and/or diagnosis of SARS-CoV-2 by FDA under an Emergency Use Authorization (EUA). This EUA will remain  in effect (meaning this test can be used) for the duration of the COVID-19 declaration under Section 564(b)(1) of the Act, 21 U.S.C.section 360bbb-3(b)(1), unless the authorization  is terminated  or revoked sooner.       Influenza A by PCR NEGATIVE NEGATIVE Final   Influenza B by PCR NEGATIVE NEGATIVE Final    Comment: (NOTE) The Xpert Xpress SARS-CoV-2/FLU/RSV plus assay is intended as an aid in the diagnosis of influenza from Nasopharyngeal swab specimens and should not be used as a sole basis for treatment. Nasal washings and aspirates  are unacceptable for Xpert Xpress SARS-CoV-2/FLU/RSV testing.  Fact Sheet for Patients: EntrepreneurPulse.com.au  Fact Sheet for Healthcare Providers: IncredibleEmployment.be  This test is not yet approved or cleared by the [place] FDA and has been authorized for detection and/or diagnosis of SARS-CoV-2 by FDA under an Emergency Use Authorization (EUA). This EUA will remain in effect (meaning this test can be used) for the duration of the COVID-19 declaration under Section 564(b)(1) of the Act, 21 U.S.C. section 360bbb-3(b)(1), unless the authorization is terminated or revoked.  Performed at [facility], [place] [address], [place], [place] [address]   Culture, blood (Routine X 2) w Reflex to ID Panel     Status: None (Preliminary result)   Collection Time: 08/30/20  2:17 AM   Specimen: BLOOD LEFT FOREARM  Result Value Ref Range Status   Specimen Description BLOOD LEFT FOREARM  Final   Special Requests   Final    BOTTLES DRAWN AEROBIC AND ANAEROBIC Blood Culture results may not be optimal due to an inadequate volume of blood received in culture bottles   Culture   Final    NO GROWTH 3 DAYS Performed at [facility], [place] [address] [place], [place] [address]    Report Status PENDING  Incomplete     Radiology Studies: DG Abd Portable 1V  Result Date: 09/03/2020 CLINICAL DATA:  Ileus. EXAM: PORTABLE ABDOMEN - 1 VIEW COMPARISON:  Aug 31, 2020. FINDINGS: Mildly dilated and air-filled large and small bowel is noted concerning for ileus. No radiopaque calculi are noted. Gastrostomy tube is noted in left upper quadrant. IMPRESSION: Stable findings concerning for ileus. Gastrostomy tube seen in left upper quadrant. Electronically Signed   By: Marijo Conception M.D.   On: 09/03/2020 09:40    Marzetta Board, MD, PhD [facility]  Between 7 am - 7 pm I am available, please contact me via Amion (for emergencies) or Securechat (non  urgent messages)  Between 7 pm - 7 am I am not available, please contact night coverage MD/APP via Amion

## 2020-09-03 NOTE — Consult Note (Signed)
Consultation  Referring Provider: [facility]/ Gherge Primary Care Physician:  [name], MD Primary Gastroenterologist:  [facility] Fitz  Reason for Consultation:  Diffuse Ileus  HPI: Kathleen Cameron is a 55 y.o. female who we are asked to see regarding diffuse ileus.  Patient was admitted 5 days ago from home with complaints of abdominal distention and pain.  She was noted to be hypoxic on admission with WBC of 38,000 and admitted with sepsis. Patient has chronic respiratory failure secondary to COPD and is on a home vent chronically.  She had cuffed trach replacement on 08/15/2020.  She has had via PEG tube. Patient says her abdominal symptoms started within 24 hours or so prior to admission.  She has not had any prior abdominal surgeries other than C-sections.  No prior history of obstruction or ileus that she is aware of. Other medical problems include seizure disorder, hypertension, asthma, anxiety, and chronic kidney disease. She says she is not hurting quite as bad as she was a couple of days ago but remains quite distended, she has not had any bowel movements for couple of days but has been passing gas.  She has vomited this morning.  Imaging on admission with CT on 08/29/2020 without IV contrast showed fatty liver, PEG tube in the stomach with a distended stomach and otherwise negative exam. KUB on 08/31/2020 showed diffuse gaseous distention of the small bowel and colon Repeat CT imaging 09/02/2020 no bowel wall thickening, evidence of anasarca small amount of free fluid in the pelvis, otherwise unremarkable.  Labs today WBC 8.0, hemoglobin 7.6/hematocrit 24.6 Potassium 3.7/magnesium 1.8 Albumin 3.4 LFTs within normal limits.  Currently PEG is clamped.  She is receiving Dilaudid as needed for pain, she has been on Reglan though this is being given p.o., and is on MiraLAX once daily.   Past Medical History:  Diagnosis Date  . Acute on chronic respiratory failure with  hypoxia (HCC)   . Anxiety disorder   . Asthma   . COPD (chronic obstructive pulmonary disease) (HCC)   . COPD, severe (HCC)   . Hypertension   . Seizure disorder [facility])     Past Surgical History:  Procedure Laterality Date  . COLONOSCOPY N/A 06/26/2020   Procedure: COLONOSCOPY;  Surgeon: Jeani Hawking, MD;  Location: [facility] ENDOSCOPY;  Service: Endoscopy;  Laterality: N/A;  . ENTEROSCOPY N/A 06/26/2020   Procedure: ENTEROSCOPY;  Surgeon: Jeani Hawking, MD;  Location: [facility] ENDOSCOPY;  Service: Endoscopy;  Laterality: N/A;  . HEMOSTASIS CLIP PLACEMENT  06/26/2020   Procedure: HEMOSTASIS CLIP PLACEMENT;  Surgeon: Jeani Hawking, MD;  Location: [facility] ENDOSCOPY;  Service: Endoscopy;;  . HOT HEMOSTASIS N/A 06/26/2020   Procedure: HOT HEMOSTASIS (ARGON PLASMA COAGULATION/BICAP);  Surgeon: Jeani Hawking, MD;  Location: [facility] ENDOSCOPY;  Service: Endoscopy;  Laterality: N/A;  . IR FLUORO RM 30-60 MIN  03/23/2020  . LAPAROSCOPIC INSERTION GASTROSTOMY TUBE N/A 04/24/2020   Procedure: LAPAROSCOPIC GASTROSTOMY TUBE PLACEMENT;  Surgeon: Sheliah Hatch De Blanch, MD;  Location: [facility] OR;  Service: General;  Laterality: N/A;  . POLYPECTOMY  06/26/2020   Procedure: POLYPECTOMY;  Surgeon: Jeani Hawking, MD;  Location: [facility] ENDOSCOPY;  Service: Endoscopy;;  . WISDOM TOOTH EXTRACTION      Prior to Admission medications   Medication Sig Start Date End Date Taking? Authorizing Provider  amLODipine (NORVASC) 5 MG tablet Take 1 tablet (5 mg total) by mouth daily. 08/24/20  Yes Sagardia, Eilleen Kempf, MD  apixaban (ELIQUIS) 5 MG TABS tablet Place 1 tablet (5 mg total)  into feeding tube 2 (two) times daily. 08/20/20 12/18/20 Yes Sagardia, Ines Bloomer, MD  arformoterol [facility]) 15 MCG/2ML NEBU Take 2 mLs (15 mcg total) by nebulization 2 (two) times daily. 07/10/20  Yes Candee Furbish, MD  bisacodyl (DULCOLAX) 10 MG suppository Place 1 suppository (10 mg total) rectally daily as needed for severe constipation. 07/10/20  Yes Sagardia, Ines Bloomer, MD   budesonide (PULMICORT) 0.25 MG/2ML nebulizer solution Take 2 mLs (0.25 mg total) by nebulization 2 (two) times daily. 07/10/20  Yes Candee Furbish, MD  chlorhexidine gluconate, MEDLINE KIT, (PERIDEX) 0.12 % solution 15 mLs by Mouth Rinse route 2 (two) times daily. 07/10/20  Yes Candee Furbish, MD  clonazePAM (KLONOPIN) 1 MG tablet Place 1/2 tablet (0.5 mg total) into feeding tube 3 (three) times daily as needed for anxiety. 08/29/20  Yes Sagardia, Ines Bloomer, MD  ipratropium-albuterol (DUONEB) 0.5-2.5 (3) MG/3ML SOLN Take 3 mLs by nebulization every 4 (four) hours. 07/22/20  Yes [provider]  levothyroxine (SYNTHROID) 50 MCG tablet Take 1 tablet (50 mcg total) by mouth daily at 6 (six) AM. 08/17/20  Yes Sagardia, Ines Bloomer, MD  lip balm (CARMEX) ointment Apply topically as needed for lip care. 03/08/20  Yes Candee Furbish, MD  metoCLOPramide (REGLAN) 5 MG/5ML solution Place 10 mLs (10 mg total) into feeding tube in the morning and at bedtime. 07/25/20  Yes Candee Furbish, MD  Multiple Vitamin (MULTIVITAMIN WITH MINERALS) TABS tablet Place 1 tablet into feeding tube daily. 07/03/20 10/01/20 Yes Hall, Lorenda Cahill, DO  Nutritional Supplements (ENSURE ACTIVE HIGH PROTEIN) LIQD Take 1 Can by mouth in the morning and at bedtime. 07/09/20  Yes Barb Merino, MD  Nutritional Supplements (FEEDING SUPPLEMENT, OSMOLITE 1.2 CAL,) LIQD Place 1,000 mLs into feeding tube continuous. 07/10/20  Yes Sagardia, Ines Bloomer, MD  oxyCODONE-acetaminophen (PERCOCET/ROXICET) 5-325 MG tablet PLACE 1 TABLET INTO FEEDING TUBE EVERY EIGHT HOURS AS NEEDED FOR UP TO 3 DAYS FOR SEVERE PAIN. Patient taking differently: Take 1 tablet by mouth every 6 (six) hours as needed for moderate pain. 07/02/20 12/29/20 Yes Hall, Carole N, DO  pantoprazole sodium (PROTONIX) 40 mg/20 mL PACK Place 20 mLs (40 mg total) into feeding tube at bedtime. 08/24/20  Yes Sagardia, Ines Bloomer, MD  phenol (CHLORASEPTIC) 1.4 % LIQD Use as directed 1 spray in  the mouth or throat as needed for throat irritation / pain. 03/08/20  Yes Candee Furbish, MD  polyethylene glycol (MIRALAX / GLYCOLAX) 17 g packet Place 17 g into feeding tube daily as needed for mild constipation. 03/08/20  Yes Candee Furbish, MD  revefenacin [facility]) 175 MCG/3ML nebulizer solution Take 3 mLs (175 mcg total) by nebulization daily. 07/10/20  Yes Candee Furbish, MD  rosuvastatin (CRESTOR) 40 MG tablet Place 1 tablet (40 mg total) into feeding tube daily. 07/10/20  Yes Sagardia, Ines Bloomer, MD  senna-docusate (SENOKOT-S) 8.6-50 MG tablet Place 2 tablets into feeding tube at bedtime as needed for moderate constipation. 03/08/20  Yes Candee Furbish, MD  sodium chloride (OCEAN) 0.65 % SOLN nasal spray Place 1 spray into both nostrils as needed for congestion. 03/08/20  Yes Candee Furbish, MD  sorbitol 70 % SOLN Place 30 mLs into feeding tube daily as needed for moderate constipation. 07/25/20  Yes Candee Furbish, MD  temazepam (RESTORIL) 15 MG capsule Place 1 capsule (15 mg total) into feeding tube at bedtime as needed for sleep. 07/20/20  Yes Horald Pollen, MD  venlafaxine [facility])  37.5 MG tablet Place 1 tablet (37.5 mg total) into feeding tube 2 (two) times daily with a meal. 07/20/20 10/18/20 Yes Sagardia, Ines Bloomer, MD  Chlorhexidine Gluconate Cloth 2 % PADS Apply 6 each topically daily. 08/21/20   Horald Pollen, MD    Current Facility-Administered Medications  Medication Dose Route Frequency Provider Last Rate Last Admin  . 0.9 %  sodium chloride infusion  250 mL Intravenous Continuous Para Skeans, MD   Stopped at 08/31/20 2128  . 0.9 %  sodium chloride infusion   Intravenous PRN Caren Griffins, MD   Paused at 08/31/20 1907  . acetaminophen (TYLENOL) tablet 650 mg  650 mg Per Tube Q6H PRN Caren Griffins, MD   650 mg at 08/30/20 1551  . albuterol (PROVENTIL) (2.5 MG/3ML) 0.083% nebulizer solution 2.5 mg  2.5 mg Nebulization Q4H PRN Chesley Mires, MD      .  apixaban (ELIQUIS) tablet 5 mg  5 mg Per Tube BID Para Skeans, MD   5 mg at 09/03/20 0933  . arformoterol (BROVANA) nebulizer solution 15 mcg  15 mcg Nebulization BID Para Skeans, MD   15 mcg at 09/03/20 0719  . bisacodyl (DULCOLAX) suppository 10 mg  10 mg Rectal Daily PRN Para Skeans, MD      . budesonide (PULMICORT) nebulizer solution 0.25 mg  0.25 mg Nebulization BID Florina Ou V, MD   0.25 mg at 09/03/20 0719  . chlorhexidine gluconate (MEDLINE KIT) (PERIDEX) 0.12 % solution 15 mL  15 mL Mouth Rinse BID Caren Griffins, MD   15 mL at 09/02/20 1956  . Chlorhexidine Gluconate Cloth 2 % PADS 6 each  6 each Topical Daily Para Skeans, MD   6 each at 09/02/20 1030  . clonazePAM (KLONOPIN) tablet 0.5 mg  0.5 mg Per Tube TID Para Skeans, MD   0.5 mg at 09/03/20 0933  . dextrose 5 % in lactated ringers infusion   Intravenous Continuous Caren Griffins, MD   Stopped at 09/03/20 [phone]  . docusate (COLACE) 50 MG/5ML liquid 100 mg  100 mg Per Tube BID PRN [place] Crane M, PA-C      . feeding supplement (OSMOLITE 1.5 CAL) liquid 237 mL  237 mL Per Tube 5 X Daily Gherghe, Costin M, MD      . feeding supplement (PROSource TF) liquid 45 mL  45 mL Per Tube BID Caren Griffins, MD   45 mL at 09/02/20 2145  . free water 180 mL  180 mL Per Tube 5 X Daily Caren Griffins, MD   180 mL at 08/31/20 0926  . HYDROmorphone (DILAUDID) injection 1 mg  1 mg Intravenous Q4H PRN Para Skeans, MD   1 mg at 09/03/20 0609  . lactulose (CHRONULAC) 10 GM/15ML solution 10 g  10 g Per Tube Daily Estill Cotta, NP   10 g at 09/03/20 0933  . levothyroxine (SYNTHROID) tablet 50 mcg  50 mcg Per Tube Q0600 Lestine Mount, PA-C   50 mcg at 09/03/20 1191  . magnesium sulfate IVPB 2 g 50 mL  2 g Intravenous Once Chesley Mires, MD      . MEDLINE mouth rinse  15 mL Mouth Rinse 10 times per day Caren Griffins, MD   15 mL at 09/03/20 0935  . metoCLOPramide (REGLAN) 5 MG/5ML solution 10 mg  10 mg Per Tube Q8H PRN  Para Skeans, MD      . multivitamin  with minerals tablet 1 tablet  1 tablet Per Tube Daily Caren Griffins, MD   1 tablet at 09/02/20 0950  . nutrition supplement (JUVEN) (JUVEN) powder packet 1 packet  1 packet Per Tube BID BM Caren Griffins, MD   1 packet at 08/31/20 1434  . ondansetron (ZOFRAN) injection 4 mg  4 mg Intravenous Q6H PRN Para Skeans, MD   4 mg at 09/03/20 0319  . pantoprazole sodium (PROTONIX) 40 mg/20 mL oral suspension 40 mg  40 mg Per Tube Daily [place] Crane M, PA-C   40 mg at 09/03/20 0934  . phenol (CHLORASEPTIC) mouth spray 1 spray  1 spray Mouth/Throat PRN Para Skeans, MD      . piperacillin-tazobactam (ZOSYN) IVPB 3.375 g  3.375 g Intravenous Q8H Sood, Vineet, MD 12.5 mL/hr at 09/03/20 0600 Infusion Verify at 09/03/20 0600  . polyethylene glycol (MIRALAX / GLYCOLAX) packet 17 g  17 g Per Tube Daily Sloan Leiter B, RPH   17 g at 09/03/20 0933  . potassium chloride (KLOR-CON) packet 60 mEq  60 mEq Per Tube Once Chesley Mires, MD      . revefenacin (YUPELRI) nebulizer solution 175 mcg  175 mcg Nebulization Daily Para Skeans, MD   175 mcg at 09/03/20 0719  . rosuvastatin (CRESTOR) tablet 40 mg  40 mg Per Tube Daily Para Skeans, MD   40 mg at 09/03/20 0933  . simethicone (MYLICON) 40 SN/0.5LZ suspension 40 mg  40 mg Per Tube Q6H [place] Crane M, PA-C   40 mg at 09/03/20 0933  . temazepam (RESTORIL) capsule 15 mg  15 mg Per Tube QHS PRN Para Skeans, MD      . venlafaxine [facility]) tablet 37.5 mg  37.5 mg Per Tube BID WC Florina Ou V, MD   37.5 mg at 09/03/20 0933    Allergies as of 08/29/2020 - Review Complete 08/29/2020  Allergen Reaction Noted  . Stiolto respimat [tiotropium bromide-olodaterol] Other (See Comments) 11/19/2018    Family History  Family history unknown: Yes    Social History   Socioeconomic History  . Marital status: Married    Spouse name: Not on file  . Number of children: Not on file  . Years of education: Not on file   . Highest education level: Not on file  Occupational History  . Not on file  Tobacco Use  . Smoking status: Never Smoker  . Smokeless tobacco: Never Used  Vaping Use  . Vaping Use: Never used  Substance and Sexual Activity  . Alcohol use: Yes    Alcohol/week: 2.0 standard drinks    Types: 2 Cans of beer per week  . Drug use: Never  . Sexual activity: Not on file  Other Topics Concern  . Not on file  Social History Narrative  . Not on file   Social Determinants of Health   Financial Resource Strain: Not on file  Food Insecurity: No Food Insecurity  . Worried About Charity fundraiser in the Last Year: Never true  . Ran Out of Food in the Last Year: Never true  Transportation Needs: No Transportation Needs  . Lack of Transportation (Medical): No  . Lack of Transportation (Non-Medical): No  Physical Activity: Not on file  Stress: Not on file  Social Connections: Not on file  Intimate Partner Violence: Not on file    Review of Systems: Gen: Denies any fever, chills, sweats, anorexia, fatigue, weakness, malaise, Pertinent positive and negative review  of systems were noted in the above HPI section.  All other review of systems was otherwise negative. Physical Exam: Vital signs in last 24 hours: Temp:  [97.7 F (36.5 C)-98.2 F (36.8 C)] 98.2 F (36.8 C) (05/16 0729) Pulse Rate:  [81-106] 99 (05/16 0900) Resp:  [13-22] 22 (05/16 0900) BP: (115-156)/(76-102) 129/76 (05/16 0900) SpO2:  [99 %-100 %] 100 % (05/16 0900) FiO2 (%):  [30 %-40 %] 30 % (05/16 0735) Weight:  [58.6 kg] 58.6 kg (05/16 0500) Last BM Date: 08/31/20 General:   Alert,  Well-developed, well-nourished, pleasant and cooperative in NAD, trach in place, patient nods or mouths responses Head:  Normocephalic and atraumatic. Eyes:  Sclera clear, no icterus.   Conjunctiva pink. Ears:  Normal auditory acuity. Nose:  No deformity, discharge,  or lesions. Mouth:  No deformity or lesions.   Neck:  Supple; no  masses or thyromegaly. Lungs:  Clear throughout to auscultation   No wheezes, crackles, or rhonchi. Heart:  Regular rate and rhythm; no murmurs, clicks, rubs,  or gallops. Abdomen: Distended, tympanitic, bowel sounds active, rather diffusely tender across the upper abdomen especially, low midline incisional scar Rectal:  Deferred  Msk:  Symmetrical without gross deformities. . Pulses:  Normal pulses noted. Extremities:  Without clubbing or edema. Neurologic:  Alert and  oriented x4;  grossly normal neurologically. Skin:  Intact without significant lesions or rashes.. Psych:  Alert and cooperative. Normal mood and affect.  Intake/Output from previous day: 05/15 0701 - 05/16 0700 In: 1446.8 [I.V.:724.3; IV Piggyback:632.5] Out: 1430 [Urine:1430] Intake/Output this shift: No intake/output data recorded.  Lab Results: Recent Labs    09/01/20 0034 09/02/20 0239 09/03/20 0140  WBC 9.3 6.2 8.0  HGB 8.4* 7.8* 7.6*  HCT 28.1* 25.4* 24.6*  PLT 275 242 252   BMET Recent Labs    09/01/20 0034 09/02/20 0239 09/03/20 0140  NA 143 138 138  K 3.8 5.0 3.7  CL 104 100 101  CO2 $Re'30 29 28  'tDF$ GLUCOSE 123* 77 73  BUN 23* 26* 18  CREATININE 0.81 0.79 0.77  CALCIUM 9.9 10.6* 9.7   LFT Recent Labs    09/03/20 0140  PROT 6.4*  ALBUMIN 2.4*  AST 23  ALT 15  ALKPHOS 56  BILITOT 0.5   PT/INR No results for input(s): LABPROT, INR in the last 72 hours. Hepatitis Panel No results for input(s): HEPBSAG, HCVAB, HEPAIGM, HEPBIGM in the last 72 hours.    IMPRESSION:  #46  55 year old African-American female, vent dependent secondary to chronic respiratory failure/COPD with trach in place Admitted with acute abdominal pain, hypoxia and found to be septic  Source of sepsis not clear blood cultures positive for staph, felt to be contaminant and urine culture grew 50,000 colonies of Pseudomonas also felt to be contaminant. She is status post very recent trach replacement 08/15/2020 Sepsis  parameters resolved  #2 chronic PEG feedings #3 diffuse ileus-suspect reactive to acute illness/sepsis.  CT imaging x2 without IV contrast does not show any intra-abdominal source for sepsis  #4 chronic kidney disease #5 seizure disorder #6 anemia/normocytic-chronic  PLAN: Plain abdominal films today Place PEG tube to intermittent suction Start Reglan 10 mg IV every 6 hours, stop p.o. Reglan Discussed stopping Dilaudid with patient.  Asked her to use Dilaudid only for severe pain and try to wean off  We will give tapwater enemas x2 today to stimulate lower bowel  GI will follow with you   Filimon Miranda PA-C 09/03/2020, 10:35 AM

## 2020-09-04 ENCOUNTER — Inpatient Hospital Stay (HOSPITAL_COMMUNITY): Payer: Medicaid Other

## 2020-09-04 DIAGNOSIS — K567 Ileus, unspecified: Secondary | ICD-10-CM | POA: Diagnosis not present

## 2020-09-04 DIAGNOSIS — J9611 Chronic respiratory failure with hypoxia: Secondary | ICD-10-CM | POA: Diagnosis not present

## 2020-09-04 LAB — COMPREHENSIVE METABOLIC PANEL
ALT: 12 U/L (ref 0–44)
AST: 22 U/L (ref 15–41)
Albumin: 2.4 g/dL — ABNORMAL LOW (ref 3.5–5.0)
Alkaline Phosphatase: 63 U/L (ref 38–126)
Anion gap: 7 (ref 5–15)
BUN: 11 mg/dL (ref 6–20)
CO2: 33 mmol/L — ABNORMAL HIGH (ref 22–32)
Calcium: 9.8 mg/dL (ref 8.9–10.3)
Chloride: 98 mmol/L (ref 98–111)
Creatinine, Ser: 0.8 mg/dL (ref 0.44–1.00)
GFR, Estimated: >60 mL/min (ref >60)
Glucose, Bld: 92 mg/dL (ref 70–99)
Potassium: 3.9 mmol/L (ref 3.5–5.1)
Sodium: 138 mmol/L (ref 135–145)
Total Bilirubin: 0.5 mg/dL (ref 0.3–1.2)
Total Protein: 6.5 g/dL (ref 6.5–8.1)

## 2020-09-04 LAB — CBC
HCT: 25.5 % — ABNORMAL LOW (ref 36.0–46.0)
Hemoglobin: 7.9 g/dL — ABNORMAL LOW (ref 12.0–15.0)
MCH: 26.3 pg (ref 26.0–34.0)
MCHC: 31 g/dL (ref 30.0–36.0)
MCV: 85 fL (ref 80.0–100.0)
Platelets: 259 10*3/uL (ref 150–400)
RBC: 3 MIL/uL — ABNORMAL LOW (ref 3.87–5.11)
RDW: 16.2 % — ABNORMAL HIGH (ref 11.5–15.5)
WBC: 7.1 10*3/uL (ref 4.0–10.5)
nRBC: 0 % (ref 0.0–0.2)

## 2020-09-04 LAB — GLUCOSE, CAPILLARY
Glucose-Capillary: 111 mg/dL — ABNORMAL HIGH (ref 70–99)
Glucose-Capillary: 117 mg/dL — ABNORMAL HIGH (ref 70–99)
Glucose-Capillary: 90 mg/dL (ref 70–99)
Glucose-Capillary: 94 mg/dL (ref 70–99)
Glucose-Capillary: 96 mg/dL (ref 70–99)
Glucose-Capillary: 97 mg/dL (ref 70–99)

## 2020-09-04 LAB — CULTURE, BLOOD (ROUTINE X 2): Culture: NO GROWTH

## 2020-09-04 LAB — TSH: TSH: 54.58 u[IU]/mL — ABNORMAL HIGH (ref 0.350–4.500)

## 2020-09-04 MED ORDER — LEVOTHYROXINE SODIUM 88 MCG PO TABS
88.0000 ug | ORAL_TABLET | Freq: Every day | ORAL | Status: DC
  Administered 2020-09-05: 88 ug
  Filled 2020-09-04: qty 1

## 2020-09-04 MED ORDER — HYDRALAZINE HCL 20 MG/ML IJ SOLN
10.0000 mg | INTRAMUSCULAR | Status: AC | PRN
  Administered 2020-09-07: 10 mg via INTRAVENOUS
  Filled 2020-09-04: qty 1

## 2020-09-04 MED ORDER — LABETALOL HCL 5 MG/ML IV SOLN: 5.0000 mg | Freq: Once | INTRAVENOUS | Status: AC

## 2020-09-04 MED ORDER — LABETALOL HCL 5 MG/ML IV SOLN
INTRAVENOUS | Status: AC
  Administered 2020-09-04: 5 mg via INTRAVENOUS
  Filled 2020-09-04: qty 4

## 2020-09-04 MED ORDER — DEXMEDETOMIDINE HCL IN NACL 400 MCG/100ML IV SOLN
0.1000 ug/kg/h | INTRAVENOUS | Status: DC
  Filled 2020-09-04: qty 100

## 2020-09-04 NOTE — Telephone Encounter (Signed)
This can likely wait until next week.  She is current in the hospital in the ICU

## 2020-09-04 NOTE — Progress Notes (Signed)
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  Apparently had an episode of desaturation at 85% on home vent.  She has had multiple recent hospitalizations for mucous plugging event resulting in respiratory arrest, followed by pulmonology as an outpatient in the trach clinic.  Recent cuffed 6.0 trach replacement 4/27.  In the ED she was febrile 38.1 WBC 23.7, concern for sepsis and started on antibiotics. Hospital course complicated by persistent ileus. GI / PCCM following  Subjective / 24h Interval events: No issued overnight, feels ok this morning. Abdominal pain better  Assessment & Plan: Principal Problem Chronic respiratory failure with tracheostomy, chronic mechanical ventilation, severe sepsis with likely pulmonary origin -patient was initially placed on broad-spectrum antibiotics with vancomycin and Zosyn -Blood cultures showing out of 4 bottles of staph species, likely contaminant -Urine cultures with Pseudomonas at 50,000 colonies, pansensitive.  Given lack of urinary symptoms this may be colonizer -Continue IV antibiotics given strict n.p.o. due to ileus, Zosyn alone, today will complete day #6, plan for total of 7 days -Continue vent support per PCCM  Active Problems Ileus -this is been persistent now for several days, despite conservative measures with n.p.o., IV fluids, G-tube venting she remains distended and quite uncomfortable.  CT scan without acute findings. GI following, appreciate input. Continue supportive treatment. Without nausea/vomiting and BMs yesterday perhaps we can try tube feeds at very low rate, will wait on GI input as well   Acute urinary retention-she she was noted to have significant  retention yesterday, required several in and out caths, eventually Foley placed on 5/15. Once ileus resolved will remove foley   COPD with recurrent mucous plugging-respiratory status appears to be stable, at her baseline, vent dependent. PCCM following  Hypothyroidism-TSH significantly elevated, previously normal for several checks, most recent normal TSH in Feb 2022. I wonder whether this is an absorption issue vs correct administration. Regardless, will increase to 88 mcg and repeat TSH in 3 weeks. Counseled patient regarding correct administration.   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 apixaban  5 mg Per Tube BID  . arformoterol  15 mcg Nebulization BID  . budesonide  0.25 mg Nebulization BID  . chlorhexidine gluconate (MEDLINE KIT)  15 mL Mouth Rinse BID  . Chlorhexidine Gluconate Cloth  6 each Topical Daily  . clonazePAM  0.5 mg Per Tube TID  . free water  180 mL Per Tube 5 X Daily  . lactulose  10 g Per Tube Daily  . [START ON 09/05/2020] levothyroxine  88 mcg Per Tube Q0600  . mouth rinse  15 mL Mouth Rinse 10 times per day  . metoCLOPramide (REGLAN) injection  10 mg Intravenous Q8H  . multivitamin with minerals  1 tablet Per Tube Daily  . nutrition supplement (JUVEN)  1 packet Per Tube BID BM  . pantoprazole sodium  40 mg Per Tube Daily  . polyethylene glycol  17 g Per Tube Daily  . revefenacin  175 mcg Nebulization Daily  . rosuvastatin  40 mg Per Tube Daily  . simethicone  40 mg Per  Tube Q6H  . venlafaxine  37.5 mg Per Tube BID WC   Continuous Infusions: . sodium chloride Stopped (08/31/20 2128)  . sodium chloride Stopped (08/31/20 1907)  . dextrose 5% lactated ringers 75 mL/hr at 09/04/20 0400  . piperacillin-tazobactam (ZOSYN)  IV  3.375 g (09/04/20 0553)   PRN Meds:.sodium chloride, acetaminophen, albuterol, bisacodyl, docusate, hydrALAZINE, HYDROmorphone (DILAUDID) injection, ondansetron (ZOFRAN) IV, phenol, temazepam  Diet Orders (From admission, onward)    Start     Ordered   08/31/20 1450  Diet NPO time specified Except for: Ice Chips  (Undifferentiated presentation (screening labs and basic nursing orders))  Diet effective now       Comments: Small sips water  Question:  Except for  Answer:  Ice Chips   08/31/20 1450         DVT prophylaxis: SCDs Start: 08/29/20 2024 apixaban (ELIQUIS) tablet 5 mg     Code Status: Full Code  Family Communication: no family at bedside   Status is: Inpatient  Remains inpatient appropriate because:Inpatient level of care appropriate due to severity of illness   Dispo: The patient is from: Home              Anticipated d/c is to: Home              Patient currently is not medically stable to d/c.   Difficult to place patient No  Level of care: ICU  Consultants:  PCCM  Procedures:  none  Microbiology  Cultures 5/11 - pending  Antimicrobials: Vancomycin stopped Zosyn 5/11 >>    Objective: Vitals:   09/04/20 0400 09/04/20 0403 09/04/20 0500 09/04/20 0600  BP: 109/79  100/76 108/76  Pulse: 75  80 77  Resp: _0 Temp: (!) 97.5 F (36.4 C)     TempSrc: Axillary     SpO2: 100%  100% 100%  Weight:  55 kg    Height:        Intake/Output Summary (Last 24 hours) at 09/04/2020 0627 Last data filed at 09/04/2020 0600 Gross per 24 hour  Intake 2158.31 ml  Output 3035 ml  Net -876.69 ml   Filed Weights   09/02/20 0154 09/03/20 0500 09/04/20 0403  Weight: 57.9 kg 58.6 kg 55 kg    Examination:  Constitutional: NAD Eyes: no icterus ENMT: mmm Neck: normal, supple Respiratory: CTA biL, no wheezing, no crackles Cardiovascular: rrr, no mrg Abdomen: mild distention, mild pain but improved. No guarding or rebound. PEG in place Musculoskeletal: no  clubbing / cyanosis.  Skin: no rashes Neurologic: non focal   Data Reviewed: I have independently reviewed following labs and imaging studies   CBC: Recent Labs  Lab 08/29/20 1645 08/29/20 1654 08/30/20 0901 08/31/20 0051 09/01/20 0034 09/02/20 0239 09/03/20 0140 09/04/20 0133  WBC 23.7*  --  15.0* 8.6 9.3 6.2 8.0 7.1  NEUTROABS 20.0*  --  12.5*  --   --   --   --   --   HGB 11.0*   < > 9.8* 8.2* 8.4* 7.8* 7.6* 7.9*  HCT 37.9   < > 33.1* 27.5* 28.1* 25.4* 24.6* 25.5*  MCV 88.1  --  87.3 86.2 86.2 84.4 84.8 85.0  PLT 438*  --  363 245 275 242 252 259   < > = values in this interval not displayed.   Basic Metabolic Panel: Recent Labs  Lab 08/30/20 0901 08/31/20 0051 09/01/20 0034 09/01/20 1200 09/02/20 0239 09/03/20 0140 09/04/20 0133  NA 137 137  143  --  138 138 138  K 4.0 3.2* 3.8  --  5.0 3.7 3.9  CL 97* 98 104  --  100 101 98  CO2 30 32 30  --  29 28 33*  GLUCOSE 114* 130* 123*  --  77 73 92  BUN 19 17 23*  --  26* 18 11  CREATININE 1.13* 0.92 0.81  --  0.79 0.77 0.80  CALCIUM 10.0 9.3 9.9  --  10.6* 9.7 9.8  MG 1.7 1.7  --  2.2  --  1.8  --   PHOS 6.1*  --   --   --   --  3.4  --    Liver Function Tests: Recent Labs  Lab 08/29/20 1645 09/01/20 0034 09/03/20 0140 09/04/20 0133  AST _0 ALT _1 ALKPHOS 84 57 56 63  BILITOT 0.7 0.8 0.5 0.5  PROT 8.6* 7.0 6.4* 6.5  ALBUMIN 3.1* 2.6* 2.4* 2.4*   Coagulation Profile: Recent Labs  Lab 08/29/20 1645  INR 1.4*   HbA1C: No results for input(s): HGBA1C in the last 72 hours. CBG: Recent Labs  Lab 09/03/20 1122 09/03/20 1514 09/03/20 1939 09/03/20 2311 09/04/20 0353  GLUCAP 92 102* 99 93 90    Recent Results (from the past 240 hour(s))  MRSA PCR Screening     Status: None   Collection Time: 08/29/20  4:55 AM   Specimen: Nasal Mucosa; Nasopharyngeal  Result Value Ref Range Status   MRSA by PCR NEGATIVE NEGATIVE Final    Comment:        The GeneXpert MRSA Assay (FDA approved  for NASAL specimens only), is one component of a comprehensive MRSA colonization surveillance program. It is not intended to diagnose MRSA infection nor to guide or monitor treatment for MRSA infections. Performed at [facility], [place] [address], [place], [place] [address]   Urine culture     Status: Abnormal   Collection Time: 08/29/20  4:45 PM   Specimen: In/Out Cath Urine  Result Value Ref Range Status   Specimen Description IN/OUT CATH URINE  Final   Special Requests   Final    NONE Performed at [facility], [place] [address] [place], [place] [address]    Culture 50,000 COLONIES/mL PSEUDOMONAS AERUGINOSA (A)  Final   Report Status 08/31/2020 FINAL  Final   Organism ID, Bacteria PSEUDOMONAS AERUGINOSA (A)  Final      Susceptibility   Pseudomonas aeruginosa - MIC*    CEFTAZIDIME <=1 SENSITIVE Sensitive     CIPROFLOXACIN <=0.25 SENSITIVE Sensitive     GENTAMICIN <=1 SENSITIVE Sensitive     IMIPENEM 2 SENSITIVE Sensitive     PIP/TAZO <=4 SENSITIVE Sensitive     CEFEPIME 2 SENSITIVE Sensitive     * 50,000 COLONIES/mL PSEUDOMONAS AERUGINOSA  Blood Culture (routine x 2)     Status: Abnormal   Collection Time: 08/29/20  4:45 PM   Specimen: BLOOD  Result Value Ref Range Status   Specimen Description BLOOD LEFT ANTECUBITAL  Final   Special Requests   Final    BOTTLES DRAWN AEROBIC AND ANAEROBIC Blood Culture adequate volume   Culture  Setup Time   Final    GRAM POSITIVE COCCI IN CLUSTERS AEROBIC BOTTLE ONLY CRITICAL RESULT CALLED TO, READ BACK BY AND VERIFIED WITH: Salome Holmes PHARMD [id number] 08/30/20 A BROWNING    Culture (A)  Final    STAPHYLOCOCCUS CAPITIS THE SIGNIFICANCE OF ISOLATING  THIS ORGANISM FROM A SINGLE SET OF BLOOD CULTURES WHEN MULTIPLE SETS ARE DRAWN IS UNCERTAIN. PLEASE NOTIFY THE MICROBIOLOGY DEPARTMENT WITHIN ONE WEEK IF SPECIATION AND SENSITIVITIES ARE REQUIRED. Performed at [facility], [place] [address], [place], [place] [address]    Report  Status 08/31/2020 FINAL  Final  Blood Culture ID Panel (Reflexed)     Status: Abnormal   Collection Time: 08/29/20  4:45 PM  Result Value Ref Range Status   Enterococcus faecalis NOT DETECTED NOT DETECTED Final   Enterococcus Faecium NOT DETECTED NOT DETECTED Final   Listeria monocytogenes NOT DETECTED NOT DETECTED Final   Staphylococcus species DETECTED (A) NOT DETECTED Final    Comment: CRITICAL RESULT CALLED TO, READ BACK BY AND VERIFIED WITH: Salome Holmes PHARMD [id number] 08/30/20 A BROWNING    Staphylococcus aureus (BCID) NOT DETECTED NOT DETECTED Final   Staphylococcus epidermidis NOT DETECTED NOT DETECTED Final   Staphylococcus lugdunensis NOT DETECTED NOT DETECTED Final   Streptococcus species NOT DETECTED NOT DETECTED Final   Streptococcus agalactiae NOT DETECTED NOT DETECTED Final   Streptococcus pneumoniae NOT DETECTED NOT DETECTED Final   Streptococcus pyogenes NOT DETECTED NOT DETECTED Final   A.calcoaceticus-baumannii NOT DETECTED NOT DETECTED Final   Bacteroides fragilis NOT DETECTED NOT DETECTED Final   Enterobacterales NOT DETECTED NOT DETECTED Final   Enterobacter cloacae complex NOT DETECTED NOT DETECTED Final   Escherichia coli NOT DETECTED NOT DETECTED Final   Klebsiella aerogenes NOT DETECTED NOT DETECTED Final   Klebsiella oxytoca NOT DETECTED NOT DETECTED Final   Klebsiella pneumoniae NOT DETECTED NOT DETECTED Final   Proteus species NOT DETECTED NOT DETECTED Final   Salmonella species NOT DETECTED NOT DETECTED Final   Serratia marcescens NOT DETECTED NOT DETECTED Final   Haemophilus influenzae NOT DETECTED NOT DETECTED Final   Neisseria meningitidis NOT DETECTED NOT DETECTED Final   Pseudomonas aeruginosa NOT DETECTED NOT DETECTED Final   Stenotrophomonas maltophilia NOT DETECTED NOT DETECTED Final   Candida albicans NOT DETECTED NOT DETECTED Final   Candida auris NOT DETECTED NOT DETECTED Final   Candida glabrata NOT DETECTED NOT DETECTED Final   Candida krusei  NOT DETECTED NOT DETECTED Final   Candida parapsilosis NOT DETECTED NOT DETECTED Final   Candida tropicalis NOT DETECTED NOT DETECTED Final   Cryptococcus neoformans/gattii NOT DETECTED NOT DETECTED Final    Comment: Performed at [facility], [address], [place], [place] [address]  Resp Panel by RT-PCR (Flu A&B, Covid) Nasopharyngeal Swab     Status: None   Collection Time: 08/29/20  5:03 PM   Specimen: Nasopharyngeal Swab; Nasopharyngeal(NP) swabs in vial transport medium  Result Value Ref Range Status   SARS Coronavirus 2 by RT PCR NEGATIVE NEGATIVE Final    Comment: (NOTE) SARS-CoV-2 target nucleic acids are NOT DETECTED.  The SARS-CoV-2 RNA is generally detectable in upper respiratory specimens during the acute phase of infection. The lowest concentration of SARS-CoV-2 viral copies this assay can detect is 138 copies/mL. A negative result does not preclude SARS-Cov-2 infection and should not be used as the sole basis for treatment or other patient management decisions. A negative result may occur with  improper specimen collection/handling, submission of specimen other than nasopharyngeal swab, presence of viral mutation(s) within the areas targeted by this assay, and inadequate number of viral copies(<138 copies/mL). A negative result must be combined with clinical observations, patient history, and epidemiological information. The expected result is Negative.  Fact Sheet for Patients:  EntrepreneurPulse.com.au  Fact Sheet for  Healthcare Providers:  IncredibleEmployment.be  This test is no t yet approved or cleared by the [place] and  has been authorized for detection and/or diagnosis of SARS-CoV-2 by FDA under an Emergency Use Authorization (EUA). This EUA will remain  in effect (meaning this test can be used) for the duration of the COVID-19 declaration under Section 564(b)(1) of the Act, 21 U.S.C.section  360bbb-3(b)(1), unless the authorization is terminated  or revoked sooner.       Influenza A by PCR NEGATIVE NEGATIVE Final   Influenza B by PCR NEGATIVE NEGATIVE Final    Comment: (NOTE) The Xpert Xpress SARS-CoV-2/FLU/RSV plus assay is intended as an aid in the diagnosis of influenza from Nasopharyngeal swab specimens and should not be used as a sole basis for treatment. Nasal washings and aspirates are unacceptable for Xpert Xpress SARS-CoV-2/FLU/RSV testing.  Fact Sheet for Patients: EntrepreneurPulse.com.au  Fact Sheet for Healthcare Providers: IncredibleEmployment.be  This test is not yet approved or cleared by the [place] FDA and has been authorized for detection and/or diagnosis of SARS-CoV-2 by FDA under an Emergency Use Authorization (EUA). This EUA will remain in effect (meaning this test can be used) for the duration of the COVID-19 declaration under Section 564(b)(1) of the Act, 21 U.S.C. section 360bbb-3(b)(1), unless the authorization is terminated or revoked.  Performed at [facility], [place] [address], [place], [place] [address]   Culture, blood (Routine X 2) w Reflex to ID Panel     Status: None (Preliminary result)   Collection Time: 08/30/20  2:17 AM   Specimen: BLOOD LEFT FOREARM  Result Value Ref Range Status   Specimen Description BLOOD LEFT FOREARM  Final   Special Requests   Final    BOTTLES DRAWN AEROBIC AND ANAEROBIC Blood Culture results may not be optimal due to an inadequate volume of blood received in culture bottles   Culture   Final    NO GROWTH 4 DAYS Performed at [facility], [place] [address] [place], [place] [address]    Report Status PENDING  Incomplete     Radiology Studies: DG Abd Portable 1V  Result Date: 09/03/2020 CLINICAL DATA:  Ileus. EXAM: PORTABLE ABDOMEN - 1 VIEW COMPARISON:  Aug 31, 2020. FINDINGS: Mildly dilated and air-filled large and small bowel is noted concerning  for ileus. No radiopaque calculi are noted. Gastrostomy tube is noted in left upper quadrant. IMPRESSION: Stable findings concerning for ileus. Gastrostomy tube seen in left upper quadrant. Electronically Signed   By: Marijo Conception M.D.   On: 09/03/2020 09:40    Marzetta Board, MD, PhD [facility]  Between 7 am - 7 pm I am available, please contact me via Amion (for emergencies) or Securechat (non urgent messages)  Between 7 pm - 7 am I am not available, please contact night coverage MD/APP via Amion

## 2020-09-04 NOTE — Progress Notes (Signed)
eLink Physician-Brief Progress Note Patient Name: Kathleen Cameron DOB: 18-Aug-1965 MRN: [id number]   Date of Service  09/04/2020  HPI/Events of Note  Patient with chronic ventilator dependent respiratory failure secondary to end stage COPD, on PRVC without sedation, hypertensive, anxious and dyssynchronous on the ventilator.  eICU Interventions  Low dose Precedex ordered for patient comfort, patient also gets scheduled Klonopin, will order PRN Hydralazine for hypertension persisting beyond anxiolysis.        [name] Bleu Moisan 09/04/2020, 8:50 PM

## 2020-09-04 NOTE — Telephone Encounter (Signed)
Please Advise Dr Alvy Bimler is out of office.  Pt needs a written prescription for [place] Mepilex Border Sacrum Self adherent silicone foam dressing pads. Size (6x6) and (9x9)

## 2020-09-04 NOTE — Progress Notes (Signed)
Daily Rounding Note  09/04/2020, 10:50 AM  LOS: 6 days   SUBJECTIVE:   Chief complaint:   Ileus  After the enema the patient had a total of 3 moderately large, liquid stools.  Same x 1 this AM.  Patient currently denying abdominal pain.  All p.o. and tube feeds remain on hold.  At home she eats/drinks as well as receives nutrition via PEG  OBJECTIVE:         Vital signs in last 24 hours:    Temp:  [97.5 F (36.4 C)-98.7 F (37.1 C)] 98.5 F (36.9 C) (05/17 0742) Pulse Rate:  [72-123] 109 (05/17 0900) Resp:  [16-29] 16 (05/17 0900) BP: (100-213)/(72-139) 213/131 (05/17 0900) SpO2:  [86 %-100 %] 93 % (05/17 0900) FiO2 (%):  [30 %-40 %] 30 % (05/17 1000) Weight:  [55 kg] 55 kg (05/17 0403) Last BM Date: 09/04/20 Filed Weights   09/02/20 0154 09/03/20 0500 09/04/20 0403  Weight: 57.9 kg 58.6 kg 55 kg   General: Looks chronically ill.  Cachectic.  Lethargic but arousable Heart: RRR Chest: No labored breathing or cough.  On vent via trach. Abdomen: Soft.  Minor right lower quadrant tenderness without guarding or rebound.  Normal quality and active bowel sounds.  Not distended Extremities: Thin legs and arms. Neuro/Psych: Lethargic but arousable.  Follows commands.  Moves all 4 limbs.  No tremors.  Paucity of speech.  Flat affect  Intake/Output from previous day: 05/16 0701 - 05/17 0700 In: 2396.9 [I.V.:1498.5; NG/GT:495; IV Piggyback:153.4] Out: 3035 [Urine:3035]  Intake/Output this shift: Total I/O In: 261 [I.V.:225; IV Piggyback:36.1] Out: 800 [Urine:800]  Lab Results: Recent Labs    09/02/20 0239 09/03/20 0140 09/04/20 0133  WBC 6.2 8.0 7.1  HGB 7.8* 7.6* 7.9*  HCT 25.4* 24.6* 25.5*  PLT 242 252 259   BMET Recent Labs    09/02/20 0239 09/03/20 0140 09/04/20 0133  NA 138 138 138  K 5.0 3.7 3.9  CL 100 101 98  CO2 29 28 33*  GLUCOSE 77 73 92  BUN 26* 18 11  CREATININE 0.79 0.77 0.80  CALCIUM  10.6* 9.7 9.8   LFT Recent Labs    09/03/20 0140 09/04/20 0133  PROT 6.4* 6.5  ALBUMIN 2.4* 2.4*  AST 23 22  ALT 15 12  ALKPHOS 56 63  BILITOT 0.5 0.5   PT/INR No results for input(s): LABPROT, INR in the last 72 hours. Hepatitis Panel No results for input(s): HEPBSAG, HCVAB, HEPAIGM, HEPBIGM in the last 72 hours.  Studies/Results: DG Abd Portable 1V  Result Date: 09/03/2020 CLINICAL DATA:  Ileus. EXAM: PORTABLE ABDOMEN - 1 VIEW COMPARISON:  Aug 31, 2020. FINDINGS: Mildly dilated and air-filled large and small bowel is noted concerning for ileus. No radiopaque calculi are noted. Gastrostomy tube is noted in left upper quadrant. IMPRESSION: Stable findings concerning for ileus. Gastrostomy tube seen in left upper quadrant. Electronically Signed   By: Marijo Conception M.D.   On: 09/03/2020 09:40   Scheduled Meds: . apixaban  5 mg Per Tube BID  . arformoterol  15 mcg Nebulization BID  . budesonide  0.25 mg Nebulization BID  . chlorhexidine gluconate (MEDLINE KIT)  15 mL Mouth Rinse BID  . Chlorhexidine Gluconate Cloth  6 each Topical Daily  . clonazePAM  0.5 mg Per Tube TID  . free water  180 mL Per Tube 5 X Daily  . lactulose  10 g Per Tube Daily  . [  START ON 09/05/2020] levothyroxine  88 mcg Per Tube Q0600  . mouth rinse  15 mL Mouth Rinse 10 times per day  . metoCLOPramide (REGLAN) injection  10 mg Intravenous Q8H  . multivitamin with minerals  1 tablet Per Tube Daily  . nutrition supplement (JUVEN)  1 packet Per Tube BID BM  . pantoprazole sodium  40 mg Per Tube Daily  . polyethylene glycol  17 g Per Tube Daily  . revefenacin  175 mcg Nebulization Daily  . rosuvastatin  40 mg Per Tube Daily  . simethicone  40 mg Per Tube Q6H  . venlafaxine  37.5 mg Per Tube BID WC   Continuous Infusions: . sodium chloride Stopped (08/31/20 2128)  . sodium chloride Stopped (08/31/20 1907)  . dextrose 5% lactated ringers 75 mL/hr (09/04/20 1044)  . piperacillin-tazobactam (ZOSYN)  IV  Stopped (09/04/20 0953)   PRN Meds:.sodium chloride, acetaminophen, albuterol, bisacodyl, docusate, hydrALAZINE, HYDROmorphone (DILAUDID) injection, ondansetron (ZOFRAN) IV, phenol, temazepam   ASSESMENT:   *   Ileus.  Abdominal pain, which is not typical for ileus.   Received 2 tapwater enemas yesterday in addition to scheduled IV Reglan, daily lactulose 3 BM's yest, 1 today.    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  *    PEG tube in situ, placed 03/2020.  Source for nutrition in addition to p.o. intake..  *   Sepsis.  Pulmonary source suspected.  Staph in blood and Pseudomonas in urine felt to be contaminant.  Sepsis parameters resolved.  *    Fatty liver.    *    chronic Eliquis, not on hold.  *   Hypothyroidism.  Synthroid dose increased in setting of elevated TSH.  PLAN   *   Xray now.  If ileus improved: start TFs at reduced rate.    *    Stopped the daily lactulose.  Lactulose tends to cause bloating and best to avoid in setting of ileus.    *   Will continue Reglan for another 24 hours and then discontinue.  Best if we can avoid long-term use.    Azucena Freed  09/04/2020, 10:50 AM Phone [phone]

## 2020-09-04 NOTE — Progress Notes (Signed)
Nutrition Follow-up  DOCUMENTATION CODES:   Non-severe (moderate) malnutrition in context of chronic illness  INTERVENTION:   Recommend begin trickle tube feeding via PEG: Osmolite 1.5 at 20 ml/h  Provides 720 kcal, 30 gm protein, 366 ml free water daily.  As TF tolerance established, can trial bolus feedings.  Continue Juven BID via tube.  Continue MVI with minerals daily via tube.  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  Ongoing  GOAL:   Patient will meet greater than or equal to 90% of their needs   Unmet  MONITOR:   Diet advancement,PO intake,Vent status,Labs,TF tolerance  REASON FOR ASSESSMENT:   Ventilator,Consult Enteral/tube feeding initiation and management  ASSESSMENT:   55 yo female admitted with decreased responsiveness. In ED, several mucous plugs were removed. 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   Discussed patient in ICU rounds and with RN today.  TF has been on hold d/t ileus since 5/13. Last dose 237 ml on 5/13 at 0500. Patient has remained NPO. She has been receiving Juven BID and free water flushes 180 ml 5 times per day via PEG.   Gastroenterology consulted and suspects hypothyroidism may be the cause of patient's ileus. Recommendations include continuing PEG to intermittent suction, tap water enemas, and IV Reglan as well as a trial of slow flow enteral foods for gut stimulation.    Patient is currently intubated on ventilator support MV: 6.9 L/min Temp (24hrs), Avg:98 F (36.7 C), Min:97.5 F (36.4 C), Max:98.5 F (36.9 C)   Labs reviewed.  CBG: [phone]  Medications reviewed and include Reglan, MVI, Juven, Protonix, lactulose, Protonix, Zosyn.  IVF: D5 LR at 75 ml/h  I/O +1.3 L since admission UOP 3035 ml x 24 hours   Diet Order:    Diet Order            Diet NPO time specified Except for: Ice Chips  Diet effective now                 EDUCATION NEEDS:   No education needs have been identified at this time  Skin:  Skin Assessment: Skin Integrity Issues: Skin Integrity Issues:: Stage II Stage II: R buttocks, R ankle  Last BM:  5/17 type 7  Height:   Ht Readings from Last 4 Encounters:  08/29/20 5\' 1"  (1.549 m)  07/21/20 5\' 5"  (1.651 m)  07/12/20 5\' 5"  (1.651 m)  07/09/20 5\' 4"  (1.626 m)    Weight:   Wt Readings from Last 1 Encounters:  09/04/20 55 kg    BMI:  Body mass index is 22.91 kg/m.  Estimated Nutritional Needs:   Kcal:  1800-2000  Protein:  80-100 gm  Fluid:  >/= 1.8 L    , RD, LDN, CNSC Please refer to Amion for contact information.

## 2020-09-05 DIAGNOSIS — J9611 Chronic respiratory failure with hypoxia: Secondary | ICD-10-CM | POA: Diagnosis not present

## 2020-09-05 DIAGNOSIS — R112 Nausea with vomiting, unspecified: Secondary | ICD-10-CM | POA: Diagnosis not present

## 2020-09-05 DIAGNOSIS — Z930 Tracheostomy status: Secondary | ICD-10-CM

## 2020-09-05 DIAGNOSIS — J449 Chronic obstructive pulmonary disease, unspecified: Secondary | ICD-10-CM | POA: Diagnosis not present

## 2020-09-05 DIAGNOSIS — K567 Ileus, unspecified: Secondary | ICD-10-CM | POA: Diagnosis not present

## 2020-09-05 DIAGNOSIS — Z9911 Dependence on respirator [ventilator] status: Secondary | ICD-10-CM

## 2020-09-05 LAB — CBC
HCT: 30 % — ABNORMAL LOW (ref 36.0–46.0)
Hemoglobin: 9 g/dL — ABNORMAL LOW (ref 12.0–15.0)
MCH: 25.9 pg — ABNORMAL LOW (ref 26.0–34.0)
MCHC: 30 g/dL (ref 30.0–36.0)
MCV: 86.2 fL (ref 80.0–100.0)
Platelets: 349 10*3/uL (ref 150–400)
RBC: 3.48 MIL/uL — ABNORMAL LOW (ref 3.87–5.11)
RDW: 16.6 % — ABNORMAL HIGH (ref 11.5–15.5)
WBC: 10.6 10*3/uL — ABNORMAL HIGH (ref 4.0–10.5)
nRBC: 0 % (ref 0.0–0.2)

## 2020-09-05 LAB — BASIC METABOLIC PANEL
Anion gap: 7 (ref 5–15)
BUN: 17 mg/dL (ref 6–20)
CO2: 34 mmol/L — ABNORMAL HIGH (ref 22–32)
Calcium: 10.2 mg/dL (ref 8.9–10.3)
Chloride: 100 mmol/L (ref 98–111)
Creatinine, Ser: 0.83 mg/dL (ref 0.44–1.00)
GFR, Estimated: >60 mL/min (ref >60)
Glucose, Bld: 90 mg/dL (ref 70–99)
Potassium: 3.4 mmol/L — ABNORMAL LOW (ref 3.5–5.1)
Sodium: 141 mmol/L (ref 135–145)

## 2020-09-05 LAB — GLUCOSE, CAPILLARY
Glucose-Capillary: 95 mg/dL (ref 70–99)
Glucose-Capillary: 101 mg/dL — ABNORMAL HIGH (ref 70–99)
Glucose-Capillary: 102 mg/dL — ABNORMAL HIGH (ref 70–99)
Glucose-Capillary: 90 mg/dL (ref 70–99)
Glucose-Capillary: 98 mg/dL (ref 70–99)
Glucose-Capillary: 106 mg/dL — ABNORMAL HIGH (ref 70–99)

## 2020-09-05 LAB — T4, FREE: Free T4: 0.71 ng/dL (ref 0.61–1.12)

## 2020-09-05 MED ORDER — METOCLOPRAMIDE HCL 5 MG/5ML PO SOLN
10.0000 mg | Freq: Two times a day (BID) | ORAL | Status: DC
  Administered 2020-09-05 – 2020-09-07 (×5): 10 mg
  Filled 2020-09-05 (×6): qty 10

## 2020-09-05 MED ORDER — OSMOLITE 1.5 CAL PO LIQD
1000.0000 mL | ORAL | Status: DC
  Administered 2020-09-05: 1000 mL
  Filled 2020-09-05: qty 1000

## 2020-09-05 MED ORDER — LEVOTHYROXINE SODIUM 100 MCG/5ML IV SOLN
50.0000 ug | Freq: Every day | INTRAVENOUS | Status: DC
  Filled 2020-09-05: qty 5

## 2020-09-05 MED ORDER — POTASSIUM CHLORIDE 20 MEQ PO PACK
40.0000 meq | PACK | Freq: Once | ORAL | Status: AC
  Administered 2020-09-05: 40 meq
  Filled 2020-09-05: qty 2

## 2020-09-05 MED ORDER — CHLORHEXIDINE GLUCONATE 0.12 % MT SOLN
OROMUCOSAL | Status: AC
  Filled 2020-09-05: qty 15

## 2020-09-05 MED ORDER — LEVOTHYROXINE SODIUM 100 MCG/5ML IV SOLN
44.0000 ug | Freq: Every day | INTRAVENOUS | Status: DC
  Administered 2020-09-05 – 2020-09-06 (×2): 44 ug via INTRAVENOUS
  Filled 2020-09-05 (×2): qty 5

## 2020-09-05 NOTE — Progress Notes (Signed)
Daily Rounding Note  09/05/2020, 1:08 PM  LOS: 7 days   SUBJECTIVE:   Chief complaint: ileus     Denies abdominal pain.  Nausea.  Not hungry. Multiple loose stools yest, none yet for day shift today.   Pt's husband says pt was eating at home and PEG only used for meds.    OBJECTIVE:         Vital signs in last 24 hours:    Temp:  [97.3 F (36.3 C)-98.2 F (36.8 C)] 97.5 F (36.4 C) (05/18 1111) Pulse Rate:  [66-115] 66 (05/18 1200) Resp:  [15-32] 16 (05/18 1200) BP: (99-212)/(72-134) 124/85 (05/18 1200) SpO2:  [92 %-100 %] 100 % (05/18 1200) FiO2 (%):  [30 %-40 %] 40 % (05/18 1200) Weight:  [55.2 kg] 55.2 kg (05/18 0427) Last BM Date: 09/04/20 Filed Weights   09/03/20 0500 09/04/20 0403 09/05/20 0427  Weight: 58.6 kg 55 kg 55.2 kg   General: Looks chronically ill Heart: RRR. Chest: Trach, vent in place. Abdomen: Soft, slight distention.  No tenderness.  Bowel sounds hypoactive but no high-pitched or tinkling bowel sounds. Extremities: Thin limbs.  No CCE Neuro/Psych: Follows commands.  Sleepy but easily aroused.  Intake/Output from previous day: 05/17 0701 - 05/18 0700 In: 1982.3 [I.V.:1666.7; NG/GT:180; IV Piggyback:135.6] Out: 4720 [Urine:4720]  Intake/Output this shift: Total I/O In: 574.8 [I.V.:374.8; Other:200] Out: 450 [Urine:450]  Lab Results: Recent Labs    09/03/20 0140 09/04/20 0133 09/05/20 0020  WBC 8.0 7.1 10.6*  HGB 7.6* 7.9* 9.0*  HCT 24.6* 25.5* 30.0*  PLT 252 259 349   BMET Recent Labs    09/03/20 0140 09/04/20 0133 09/05/20 0020  NA 138 138 141  K 3.7 3.9 3.4*  CL 101 98 100  CO2 28 33* 34*  GLUCOSE 73 92 90  BUN $Re'18 11 17  'lFU$ CREATININE 0.77 0.80 0.83  CALCIUM 9.7 9.8 10.2   LFT Recent Labs    09/03/20 0140 09/04/20 0133  PROT 6.4* 6.5  ALBUMIN 2.4* 2.4*  AST 23 22  ALT 15 12  ALKPHOS 56 63  BILITOT 0.5 0.5   PT/INR No results for input(s): LABPROT, INR in  the last 72 hours. Hepatitis Panel No results for input(s): HEPBSAG, HCVAB, HEPAIGM, HEPBIGM in the last 72 hours.  Studies/Results: DG Abd 1 View  Result Date: 09/04/2020 CLINICAL DATA:  Ileus. EXAM: ABDOMEN - 1 VIEW COMPARISON:  Sep 03, 2020. FINDINGS: Gastrostomy tube is again noted in the left upper quadrant. Mildly dilated large and small bowel loops are again noted suggesting ileus. No radio-opaque calculi or other significant radiographic abnormality are seen. IMPRESSION: Stable findings consistent with ileus. Electronically Signed   By: Marijo Conception M.D.   On: 09/04/2020 13:30   DG CHEST PORT 1 VIEW  Result Date: 09/04/2020 CLINICAL DATA:  Dyspnea EXAM: PORTABLE CHEST 1 VIEW COMPARISON:  Radiograph 08/29/2020, CT 02/28/2020 FINDINGS: Tracheostomy tube tip terminates mid to upper trachea approximately 8 cm from the carina. Telemetry leads and external support devices overlie the chest. Chronically coarsened interstitial changes and regions of scarring in both lungs are similar to comparison exam. Blunting of bilateral costophrenic sulci may reflect a combination of hyperinflation and scarring seen on comparison images few nodular opacities in the right upper lobe are unchanged radiographically. No new consolidative process or convincing features of acute pulmonary edema. No pneumothorax. Stable cardiomediastinal contours with a calcified aorta. No acute osseous or soft tissue abnormality. IMPRESSION: No  acute cardiopulmonary abnormality. Chronic hyperinflation, coarsened interstitial changes and scarring, similar to prior. Blunting of the costophrenic sulci, favor scarring and hyperinflation as well. Effusions less favored. Stable nodular opacities in the right upper lobe. Aortic Atherosclerosis (ICD10-I70.0). Electronically Signed   By: Lovena Le M.D.   On: 09/04/2020 20:27   Scheduled Meds: . apixaban  5 mg Per Tube BID  . arformoterol  15 mcg Nebulization BID  . budesonide  0.25 mg  Nebulization BID  . chlorhexidine gluconate (MEDLINE KIT)  15 mL Mouth Rinse BID  . Chlorhexidine Gluconate Cloth  6 each Topical Daily  . clonazePAM  0.5 mg Per Tube TID  . free water  180 mL Per Tube 5 X Daily  . levothyroxine  44 mcg Intravenous Daily  . mouth rinse  15 mL Mouth Rinse 10 times per day  . metoCLOPramide (REGLAN) injection  10 mg Intravenous Q8H  . multivitamin with minerals  1 tablet Per Tube Daily  . nutrition supplement (JUVEN)  1 packet Per Tube BID BM  . pantoprazole sodium  40 mg Per Tube Daily  . polyethylene glycol  17 g Per Tube Daily  . revefenacin  175 mcg Nebulization Daily  . rosuvastatin  40 mg Per Tube Daily  . simethicone  40 mg Per Tube Q6H  . venlafaxine  37.5 mg Per Tube BID WC   Continuous Infusions: . sodium chloride Stopped (08/31/20 2128)  . sodium chloride Stopped (08/31/20 1907)  . dexmedetomidine (PRECEDEX) IV infusion Stopped (09/05/20 0016)  . dextrose 5% lactated ringers 75 mL/hr at 09/05/20 1200   PRN Meds:.sodium chloride, acetaminophen, albuterol, bisacodyl, docusate, hydrALAZINE, HYDROmorphone (DILAUDID) injection, ondansetron (ZOFRAN) IV, phenol, temazepam   ASSESMENT:   *   Ileus.  Abdominal pain, which is not typical for ileus.   Received 2 tapwater enemas yesterday in addition to scheduled IV Reglan, daily lactulose Yesterday's x-ray showed persistent ileus.   *   Hypokalemia.  K  3.4.  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  *    PEG tube in situ, placed 03/2020.  Source for nutrition in addition to p.o. intake..  *   Sepsis.  Pulmonary source suspected.  Staph in blood and Pseudomonas in urine felt to be contaminant.  Sepsis parameters resolved.  *    Fatty liver.    *    chronic Eliquis, not on hold.  *   Hypothyroidism.    TSH 54.5, free T4 0.7.  Synthroid dose increased in setting of elevated TSH.   *  Hx DVT.  Chronic Eliquis.   PLAN   *   Agree with Dr. Cruzita Lederer  that we can probably resume enteric feedings but needs SLP to evaluate her swallowing formally before deciding whether to go back to PEG feedings and or allow oral feeding.  She looks malnourished so I do not think what ever oral intake she had at home was adequate.  Unless she has GI complaints of nausea, vomiting or abdominal pain would not recheck routine x-rays as they are likely to confirm chronic ileus.  Resume home dose of Reglan liquid, 10 mg AM and HS.      Kathleen Cameron  09/05/2020, 1:08 PM Phone [phone]

## 2020-09-05 NOTE — Progress Notes (Signed)
Nutrition Follow-up  DOCUMENTATION CODES:   Non-severe (moderate) malnutrition in context of chronic illness  INTERVENTION:   Begin trickle tube feeding via PEG: Osmolite 1.5 at 20 ml/h  Provides 720 kcal, 30 gm protein, 366 ml free water daily.  As TF tolerance established, can trial bolus feedings.  Continue Juven BID via tube.  Continue MVI with minerals daily via tube.  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  Ongoing  GOAL:   Patient will meet greater than or equal to 90% of their needs   Unmet  MONITOR:   Diet advancement,PO intake,Vent status,Labs,TF tolerance  REASON FOR ASSESSMENT:   Ventilator,Consult Enteral/tube feeding initiation and management  ASSESSMENT:   55 yo female admitted with decreased responsiveness. In ED, several mucous plugs were removed. 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   TF has been on hold d/t ileus since 5/13. Last dose 237 ml on 5/13 at 0500. Patient has remained NPO. She has been receiving Juven BID and free water flushes 180 ml 5 times per day via PEG.   Gastroenterology okay with starting trickle TF at 20 ml/h.   Patient is currently intubated on ventilator support MV: 7.4 L/min Temp (24hrs), Avg:97.7 F (36.5 C), Min:97.3 F (36.3 C), Max:98.2 F (36.8 C)   Labs reviewed. K 3.4 CBG: 95-101-102  Medications reviewed and include Reglan, MVI, Juven, Protonix, Protonix.  IVF: D5 LR at 75 ml/h  I/O -571 ml since admission UOP 4720 ml x 24 hours   Diet Order:   Diet Order            Diet NPO time specified Except for: Ice Chips, Other (See Comments)  Diet effective now                 EDUCATION NEEDS:   No education needs have been identified at this time  Skin:  Skin Assessment: Skin Integrity Issues: Skin  Integrity Issues:: Stage II Stage II: R buttocks, R ankle  Last BM:  5/17 type 7  Height:   Ht Readings from Last 4 Encounters:  08/29/20 5\' 1"  (1.549 m)  07/21/20 5\' 5"  (1.651 m)  07/12/20 5\' 5"  (1.651 m)  07/09/20 5\' 4"  (1.626 m)    Weight:   Wt Readings from Last 1 Encounters:  09/05/20 55.2 kg    BMI:  Body mass index is 22.99 kg/m.  Estimated Nutritional Needs:   Kcal:  1800-2000  Protein:  80-100 gm  Fluid:  >/= 1.8 L    , RD, LDN, CNSC Please refer to Amion for contact information.

## 2020-09-05 NOTE — Progress Notes (Signed)
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  Apparently had an episode of desaturation at 85% on home vent.  She has had multiple recent hospitalizations for mucous plugging event resulting in respiratory arrest, followed by pulmonology as an outpatient in the trach clinic.  Recent cuffed 6.0 trach replacement 4/27.  In the ED she was febrile 38.1 WBC 23.7, concern for sepsis and started on antibiotics. Hospital course complicated by persistent ileus. GI / PCCM following  Subjective / 24h Interval events: Last night became more hypotensive, tachycardic and agitated.  Low-dose Precedex ordered overnight.  She is calm this morning, denies any abdominal pain, denies any shortness of breath.  Assessment & Plan: Principal Problem Chronic respiratory failure with tracheostomy, chronic mechanical ventilation, severe sepsis with likely pulmonary origin -patient was initially placed on broad-spectrum antibiotics with vancomycin and Zosyn -Blood cultures showing out of 4 bottles of staph species, likely contaminant -Urine cultures with Pseudomonas at 50,000 colonies, pansensitive.  Given lack of urinary symptoms this may be colonizer -Continue IV antibiotics given strict n.p.o. due to ileus, Zosyn alone, today will complete day #7, plan for total of 7 days -Continue vent support per PCCM  Active Problems Ileus -this is been persistent now for several days, despite conservative measures with n.p.o., IV fluids, G-tube venting she remains distended and quite uncomfortable.  CT scan without acute findings. GI following, appreciate input. Continue supportive treatment.  No further nausea and vomiting, she is clinically better, I wonder  whether we could resume tube feeds, defer to GI  Acute urinary retention-she she was noted to have significant retention yesterday, required several in and out caths, eventually Foley placed on 5/15.  Once ileus will resolve we will remove Foley  COPD with recurrent mucous plugging-respiratory status appears to be stable, at her baseline, vent dependent. PCCM following  Hypothyroidism-TSH significantly elevated, previously normal for several checks, most recent normal TSH in Feb 2022. I wonder whether this is an absorption issue vs correct administration. Regardless, will increase to 88 mcg and repeat TSH in 3 weeks, for now while p.o. administration is questionable due to ileus use IV.  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 apixaban  5 mg Per Tube BID  . arformoterol  15 mcg Nebulization BID  . budesonide  0.25 mg Nebulization BID  . chlorhexidine gluconate (MEDLINE KIT)  15 mL Mouth Rinse BID  . Chlorhexidine Gluconate Cloth  6 each Topical Daily  . clonazePAM  0.5 mg Per Tube TID  . free water  180 mL Per Tube 5 X Daily  . levothyroxine  50 mcg Intravenous Daily  . mouth rinse  15 mL Mouth Rinse 10 times per day  . metoCLOPramide (REGLAN) injection  10 mg Intravenous Q8H  . multivitamin with minerals  1 tablet Per Tube Daily  . nutrition supplement (JUVEN)  1 packet Per Tube BID BM  . pantoprazole sodium  40 mg Per Tube Daily  .  polyethylene glycol  17 g Per Tube Daily  . revefenacin  175 mcg Nebulization Daily  . rosuvastatin  40 mg Per Tube Daily  . simethicone  40 mg Per Tube Q6H  . venlafaxine  37.5 mg Per Tube BID WC    Continuous Infusions: . sodium chloride Stopped (08/31/20 2128)  . sodium chloride Stopped (08/31/20 1907)  . dexmedetomidine (PRECEDEX) IV infusion Stopped (09/05/20 0016)  . dextrose 5% lactated ringers 75 mL/hr at 09/05/20 0800   PRN Meds:.sodium chloride, acetaminophen, albuterol, bisacodyl, docusate, hydrALAZINE, HYDROmorphone (DILAUDID) injection, ondansetron (ZOFRAN) IV, phenol, temazepam  Diet Orders (From admission, onward)    Start     Ordered   08/31/20 1450  Diet NPO time specified Except for: Ice Chips  (Undifferentiated presentation (screening labs and basic nursing orders))  Diet effective now       Comments: Small sips water  Question:  Except for  Answer:  Ice Chips   08/31/20 1450         DVT prophylaxis: SCDs Start: 08/29/20 2024 apixaban (ELIQUIS) tablet 5 mg     Code Status: Full Code  Family Communication: no family at bedside   Status is: Inpatient  Remains inpatient appropriate because:Inpatient level of care appropriate due to severity of illness   Dispo: The patient is from: Home              Anticipated d/c is to: Home              Patient currently is not medically stable to d/c.   Difficult to place patient No  Level of care: ICU  Consultants:  PCCM  Procedures:  none  Microbiology  Cultures 5/11 - pending  Antimicrobials: Vancomycin stopped Zosyn 5/11 >>    Objective: Vitals:   09/05/20 0600 09/05/20 0700 09/05/20 0726 09/05/20 0751  BP: 99/74 113/72 113/72   Pulse: 72 72    Resp: 16 16    Temp:    98.1 F (36.7 C)  TempSrc:    Oral  SpO2: 100% 100% 100%   Weight:      Height:        Intake/Output Summary (Last 24 hours) at 09/05/2020 0823 Last data filed at 09/05/2020 0800 Gross per 24 hour  Intake 1969.86 ml  Output 4820 ml  Net -2850.14 ml   Filed Weights   09/03/20 0500 09/04/20 0403 09/05/20 0427  Weight: 58.6 kg 55 kg 55.2 kg    Examination:  Constitutional: No distress Eyes: No icterus ENMT:  mmm Neck: normal, supple Respiratory: Clear bilaterally, no wheezing, no crackles Cardiovascular: Regular rate and rhythm, no murmurs Abdomen: Mildly distended but no pain today.  No guarding or rebound.  PEG in place Musculoskeletal: no clubbing / cyanosis.  Skin: No rashes seen Neurologic: No focal deficits  Data Reviewed: I have independently reviewed following labs and imaging studies   CBC: Recent Labs  Lab 08/29/20 1645 08/29/20 1654 08/30/20 0901 08/31/20 0051 09/01/20 0034 09/02/20 0239 09/03/20 0140 09/04/20 0133 09/05/20 0020  WBC 23.7*  --  15.0*   < > 9.3 6.2 8.0 7.1 10.6*  NEUTROABS 20.0*  --  12.5*  --   --   --   --   --   --   HGB 11.0*   < > 9.8*   < > 8.4* 7.8* 7.6* 7.9* 9.0*  HCT 37.9   < > 33.1*   < > 28.1* 25.4* 24.6* 25.5* 30.0*  MCV 88.1  --  87.3   < >  86.2 84.4 84.8 85.0 86.2  PLT 438*  --  363   < > 275 242 252 259 349   < > = values in this interval not displayed.   Basic Metabolic Panel: Recent Labs  Lab 08/30/20 0901 08/31/20 0051 09/01/20 0034 09/01/20 1200 09/02/20 0239 09/03/20 0140 09/04/20 0133 09/05/20 0020  NA 137 137 143  --  138 138 138 141  K 4.0 3.2* 3.8  --  5.0 3.7 3.9 3.4*  CL 97* 98 104  --  100 101 98 100  CO2 30 32 30  --  29 28 33* 34*  GLUCOSE 114* 130* 123*  --  77 73 92 90  BUN 19 17 23*  --  26* $Re'18 11 17  'kiM$ CREATININE 1.13* 0.92 0.81  --  0.79 0.77 0.80 0.83  CALCIUM 10.0 9.3 9.9  --  10.6* 9.7 9.8 10.2  MG 1.7 1.7  --  2.2  --  1.8  --   --   PHOS 6.1*  --   --   --   --  3.4  --   --    Liver Function Tests: Recent Labs  Lab 08/29/20 1645 09/01/20 0034 09/03/20 0140 09/04/20 0133  AST $Re'26 27 23 22  'gfw$ ALT $R'14 19 15 12  'qh$ ALKPHOS 84 57 56 63  BILITOT 0.7 0.8 0.5 0.5  PROT 8.6* 7.0 6.4* 6.5  ALBUMIN 3.1* 2.6* 2.4* 2.4*   Coagulation Profile: Recent Labs  Lab 08/29/20 1645  INR 1.4*   HbA1C: No results for input(s): HGBA1C in the last 72 hours. CBG: Recent Labs  Lab 09/04/20 1522 09/04/20 1918  09/04/20 2339 09/05/20 0325 09/05/20 0749  GLUCAP 97 117* 96 95 101*    Recent Results (from the past 240 hour(s))  MRSA PCR Screening     Status: None   Collection Time: 08/29/20  4:55 AM   Specimen: Nasal Mucosa; Nasopharyngeal  Result Value Ref Range Status   MRSA by PCR NEGATIVE NEGATIVE Final    Comment:        The GeneXpert MRSA Assay (FDA approved for NASAL specimens only), is one component of a comprehensive MRSA colonization surveillance program. It is not intended to diagnose MRSA infection nor to guide or monitor treatment for MRSA infections. Performed at [facility], [place] [address], [place], [place] [address]   Urine culture     Status: Abnormal   Collection Time: 08/29/20  4:45 PM   Specimen: In/Out Cath Urine  Result Value Ref Range Status   Specimen Description IN/OUT CATH URINE  Final   Special Requests   Final    NONE Performed at [facility], [place] [address], [place], [place] [address]    Culture 50,000 COLONIES/mL PSEUDOMONAS AERUGINOSA (A)  Final   Report Status 08/31/2020 FINAL  Final   Organism ID, Bacteria PSEUDOMONAS AERUGINOSA (A)  Final      Susceptibility   Pseudomonas aeruginosa - MIC*    CEFTAZIDIME <=1 SENSITIVE Sensitive     CIPROFLOXACIN <=0.25 SENSITIVE Sensitive     GENTAMICIN <=1 SENSITIVE Sensitive     IMIPENEM 2 SENSITIVE Sensitive     PIP/TAZO <=4 SENSITIVE Sensitive     CEFEPIME 2 SENSITIVE Sensitive     * 50,000 COLONIES/mL PSEUDOMONAS AERUGINOSA  Blood Culture (routine x 2)     Status: Abnormal   Collection Time: 08/29/20  4:45 PM   Specimen: BLOOD  Result Value Ref Range Status   Specimen Description BLOOD LEFT  ANTECUBITAL  Final   Special Requests   Final    BOTTLES DRAWN AEROBIC AND ANAEROBIC Blood Culture adequate volume   Culture  Setup Time   Final    GRAM POSITIVE COCCI IN CLUSTERS AEROBIC BOTTLE ONLY CRITICAL RESULT CALLED TO, READ BACK BY AND VERIFIED WITH: Salome Holmes PHARMD 2023 08/30/20 A  BROWNING    Culture (A)  Final    STAPHYLOCOCCUS CAPITIS THE SIGNIFICANCE OF ISOLATING THIS ORGANISM FROM A SINGLE SET OF BLOOD CULTURES WHEN MULTIPLE SETS ARE DRAWN IS UNCERTAIN. PLEASE NOTIFY THE MICROBIOLOGY DEPARTMENT WITHIN ONE WEEK IF SPECIATION AND SENSITIVITIES ARE REQUIRED. Performed at [facility], [place] [address] [place], [place] [address]    Report Status 08/31/2020 FINAL  Final  Blood Culture ID Panel (Reflexed)     Status: Abnormal   Collection Time: 08/29/20  4:45 PM  Result Value Ref Range Status   Enterococcus faecalis NOT DETECTED NOT DETECTED Final   Enterococcus Faecium NOT DETECTED NOT DETECTED Final   Listeria monocytogenes NOT DETECTED NOT DETECTED Final   Staphylococcus species DETECTED (A) NOT DETECTED Final    Comment: CRITICAL RESULT CALLED TO, READ BACK BY AND VERIFIED WITH: Salome Holmes PHARMD 8616 08/30/20 A BROWNING    Staphylococcus aureus (BCID) NOT DETECTED NOT DETECTED Final   Staphylococcus epidermidis NOT DETECTED NOT DETECTED Final   Staphylococcus lugdunensis NOT DETECTED NOT DETECTED Final   Streptococcus species NOT DETECTED NOT DETECTED Final   Streptococcus agalactiae NOT DETECTED NOT DETECTED Final   Streptococcus pneumoniae NOT DETECTED NOT DETECTED Final   Streptococcus pyogenes NOT DETECTED NOT DETECTED Final   A.calcoaceticus-baumannii NOT DETECTED NOT DETECTED Final   Bacteroides fragilis NOT DETECTED NOT DETECTED Final   Enterobacterales NOT DETECTED NOT DETECTED Final   Enterobacter cloacae complex NOT DETECTED NOT DETECTED Final   Escherichia coli NOT DETECTED NOT DETECTED Final   Klebsiella aerogenes NOT DETECTED NOT DETECTED Final   Klebsiella oxytoca NOT DETECTED NOT DETECTED Final   Klebsiella pneumoniae NOT DETECTED NOT DETECTED Final   Proteus species NOT DETECTED NOT DETECTED Final   Salmonella species NOT DETECTED NOT DETECTED Final   Serratia marcescens NOT DETECTED NOT DETECTED Final   Haemophilus influenzae NOT  DETECTED NOT DETECTED Final   Neisseria meningitidis NOT DETECTED NOT DETECTED Final   Pseudomonas aeruginosa NOT DETECTED NOT DETECTED Final   Stenotrophomonas maltophilia NOT DETECTED NOT DETECTED Final   Candida albicans NOT DETECTED NOT DETECTED Final   Candida auris NOT DETECTED NOT DETECTED Final   Candida glabrata NOT DETECTED NOT DETECTED Final   Candida krusei NOT DETECTED NOT DETECTED Final   Candida parapsilosis NOT DETECTED NOT DETECTED Final   Candida tropicalis NOT DETECTED NOT DETECTED Final   Cryptococcus neoformans/gattii NOT DETECTED NOT DETECTED Final    Comment: Performed at [facility], [address] [place], [place] [address]  Resp Panel by RT-PCR (Flu A&B, Covid) Nasopharyngeal Swab     Status: None   Collection Time: 08/29/20  5:03 PM   Specimen: Nasopharyngeal Swab; Nasopharyngeal(NP) swabs in vial transport medium  Result Value Ref Range Status   SARS Coronavirus 2 by RT PCR NEGATIVE NEGATIVE Final    Comment: (NOTE) SARS-CoV-2 target nucleic acids are NOT DETECTED.  The SARS-CoV-2 RNA is generally detectable in upper respiratory specimens during the acute phase of infection. The lowest concentration of SARS-CoV-2 viral copies this assay can detect is 138 copies/mL. A negative result does not preclude SARS-Cov-2 infection and should not be used as  the sole basis for treatment or other patient management decisions. A negative result may occur with  improper specimen collection/handling, submission of specimen other than nasopharyngeal swab, presence of viral mutation(s) within the areas targeted by this assay, and inadequate number of viral copies(<138 copies/mL). A negative result must be combined with clinical observations, patient history, and epidemiological information. The expected result is Negative.  Fact Sheet for Patients:  EntrepreneurPulse.com.au  Fact Sheet for Healthcare Providers:   IncredibleEmployment.be  This test is no t yet approved or cleared by the [place] FDA and  has been authorized for detection and/or diagnosis of SARS-CoV-2 by FDA under an Emergency Use Authorization (EUA). This EUA will remain  in effect (meaning this test can be used) for the duration of the COVID-19 declaration under Section 564(b)(1) of the Act, 21 U.S.C.section 360bbb-3(b)(1), unless the authorization is terminated  or revoked sooner.       Influenza A by PCR NEGATIVE NEGATIVE Final   Influenza B by PCR NEGATIVE NEGATIVE Final    Comment: (NOTE) The Xpert Xpress SARS-CoV-2/FLU/RSV plus assay is intended as an aid in the diagnosis of influenza from Nasopharyngeal swab specimens and should not be used as a sole basis for treatment. Nasal washings and aspirates are unacceptable for Xpert Xpress SARS-CoV-2/FLU/RSV testing.  Fact Sheet for Patients: EntrepreneurPulse.com.au  Fact Sheet for Healthcare Providers: IncredibleEmployment.be  This test is not yet approved or cleared by the [place] FDA and has been authorized for detection and/or diagnosis of SARS-CoV-2 by FDA under an Emergency Use Authorization (EUA). This EUA will remain in effect (meaning this test can be used) for the duration of the COVID-19 declaration under Section 564(b)(1) of the Act, 21 U.S.C. section 360bbb-3(b)(1), unless the authorization is terminated or revoked.  Performed at [facility], [place] [address] [place], [place] [address]   Culture, blood (Routine X 2) w Reflex to ID Panel     Status: None   Collection Time: 08/30/20  2:17 AM   Specimen: BLOOD LEFT FOREARM  Result Value Ref Range Status   Specimen Description BLOOD LEFT FOREARM  Final   Special Requests   Final    BOTTLES DRAWN AEROBIC AND ANAEROBIC Blood Culture results may not be optimal due to an inadequate volume of blood received in culture bottles   Culture    Final    NO GROWTH 5 DAYS Performed at [facility], [place] [address], [place], [place] [address]    Report Status 09/04/2020 FINAL  Final     Radiology Studies: DG Abd 1 View  Result Date: 09/04/2020 CLINICAL DATA:  Ileus. EXAM: ABDOMEN - 1 VIEW COMPARISON:  Sep 03, 2020. FINDINGS: Gastrostomy tube is again noted in the left upper quadrant. Mildly dilated large and small bowel loops are again noted suggesting ileus. No radio-opaque calculi or other significant radiographic abnormality are seen. IMPRESSION: Stable findings consistent with ileus. Electronically Signed   By: Marijo Conception M.D.   On: 09/04/2020 13:30   DG CHEST PORT 1 VIEW  Result Date: 09/04/2020 CLINICAL DATA:  Dyspnea EXAM: PORTABLE CHEST 1 VIEW COMPARISON:  Radiograph 08/29/2020, CT 02/28/2020 FINDINGS: Tracheostomy tube tip terminates mid to upper trachea approximately 8 cm from the carina. Telemetry leads and external support devices overlie the chest. Chronically coarsened interstitial changes and regions of scarring in both lungs are similar to comparison exam. Blunting of bilateral costophrenic sulci may reflect a combination of hyperinflation and scarring seen on comparison images few nodular opacities  in the right upper lobe are unchanged radiographically. No new consolidative process or convincing features of acute pulmonary edema. No pneumothorax. Stable cardiomediastinal contours with a calcified aorta. No acute osseous or soft tissue abnormality. IMPRESSION: No acute cardiopulmonary abnormality. Chronic hyperinflation, coarsened interstitial changes and scarring, similar to prior. Blunting of the costophrenic sulci, favor scarring and hyperinflation as well. Effusions less favored. Stable nodular opacities in the right upper lobe. Aortic Atherosclerosis (ICD10-I70.0). Electronically Signed   By: Lovena Le M.D.   On: 09/04/2020 20:27    Marzetta Board, MD, PhD [facility]  Between 7 am - 7 pm I am  available, please contact me via Amion (for emergencies) or Securechat (non urgent messages)  Between 7 pm - 7 am I am not available, please contact night coverage MD/APP via Amion

## 2020-09-05 NOTE — Progress Notes (Signed)
NAME:  Kathleen Cameron, MRN:  [id number], DOB:  [birth year], LOS: 7 ADMISSION DATE:  08/29/2020, CONSULTATION DATE:  08/29/2020 REFERRING MD:  Dr. Allena Katz CHIEF COMPLAINT:  SOB  History of Present Illness:  55 yo female presented to ED with dyspnea and hypoxia with SpO2 85%.  She had fever and found to have mucus plugging.  Started on antibiotics.  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  She is followed in trach clinic [name].  She has hx of respiratory arrest from mucus plugging in the setting of Corynebacterium pneumonia.  Pertinent Medical History:  Anxiety, HTN, Seizures  Significant Hospital Events:  . 5/11 Increasing SOB, presented to [facility] ED, code sepsis called, Pip-tazo>, Vanc> flagyl x1, head ct neg, CT abd pelvis grossly unremarkable, UC> , BC> . 5/12 Anxious overnight, improved with Xanax.  . 5/15 surgery consulted for abdominal pain . 5/16 GI consulted  Tests:   CT head 5/11 >> atrophy and small vessel ischemic changes  CT abd/pelvis 5/11 >> distended stomach  CT abd/pelvis 5/15 >> no acute findings  Interim History / Subjective:  Found to have hypothyroidism.    Objective   Blood pressure 116/85, pulse 66, temperature 98.1 F (36.7 C), temperature source Oral, resp. rate 16, height 5\' 1"  (1.549 m), weight 55.2 kg, SpO2 100 %.    Vent Mode: PRVC FiO2 (%):  [30 %-40 %] 40 % Set Rate:  [16 bmp] 16 bmp Vt Set:  [420 mL] 420 mL PEEP:  [5 cmH20] 5 cmH20 Plateau Pressure:  [17 cmH20-22 cmH20] 20 cmH20   Intake/Output Summary (Last 24 hours) at 09/05/2020 0946 Last data filed at 09/05/2020 0900 Gross per 24 hour  Intake 2157.42 ml  Output 4945 ml  Net -2787.58 ml   Filed Weights   09/03/20 0500 09/04/20 0403 09/05/20 0427  Weight: 58.6 kg 55 kg 55.2 kg    Physical Examination:  General - alert Eyes - pupils reactive ENT - trach site clean Cardiac - regular rate/rhythm, no murmur Chest - equal breath  sounds b/l, no wheezing or rales Abdomen - soft, non tender, + bowel sounds Extremities - no cyanosis, clubbing, or edema Skin - no rashes Neuro - normal strength, moves extremities, follows commands Psych - normal mood and behavior   Resolved Hospital Problem list     Assessment & Plan:   Acute on chronic hypoxic respiratory failure from tracheobronchitis with mucus plugging. Chronic trach/vent status. Severe COPD with emphysema. - goal SpO2 90 to 95% - can try pressure support wean to TC, but unlikely she would keep up with this at home - can try transitioning back to home vent - goal SpO2 90 to 95% - f/u CXR intermittently - mobilize as able >> she sits intermittent in chair at home, but mostly remains in bed - trach care - continue brovana, pulmicort, yupelri with prn albuterol - completed ABx 5/17  Ileus. Hypothyroidism. Seizure disorder. Anxiety. Lt [place] DVT from 06/12/20. Hypothyroidism. Deconditioning. - per primary team  Best practice:  Diet: tube feeds DVT prophylaxis: Eliquis GI prophylaxis: Protonix Mobility: as tolerated Code Status:  full code Disposition: ICU  Updated pt's husband at bedside.  PCCM will f/u on 09/07/20.  Call if help needed sooner.  Labs:   CMP Latest Ref Rng & Units 09/05/2020 09/04/2020 09/03/2020  Glucose 70 - 99 mg/dL 90 92 73  BUN 6 - 20 mg/dL 17 11 18   Creatinine 0.44 - 1.00 mg/dL 09/05/2020  0.77  Sodium 135 - 145 mmol/L 141 138 138  Potassium 3.5 - 5.1 mmol/L 3.4(L) 3.9 3.7  Chloride 98 - 111 mmol/L 100 98 101  CO2 22 - 32 mmol/L 34(H) 33(H) 28  Calcium 8.9 - 10.3 mg/dL 10.9 9.8 9.7  Total Protein 6.5 - 8.1 g/dL - 6.5 3.2(T)  Total Bilirubin 0.3 - 1.2 mg/dL - 0.5 0.5  Alkaline Phos 38 - 126 U/L - 63 56  AST 15 - 41 U/L - 22 23  ALT 0 - 44 U/L - 12 15    CBC Latest Ref Rng & Units 09/05/2020 09/04/2020 09/03/2020  WBC 4.0 - 10.5 K/uL 10.6(H) 7.1 8.0  Hemoglobin 12.0 - 15.0 g/dL 9.0(L) 7.9(L) 7.6(L)  Hematocrit 36.0 - 46.0  % 30.0(L) 25.5(L) 24.6(L)  Platelets 150 - 400 K/uL 349 259 252    ABG    Component Value Date/Time   PHART 7.411 08/29/2020 2230   PCO2ART 46.3 08/29/2020 2230   PO2ART 99 08/29/2020 2230   HCO3 29.5 (H) 08/29/2020 2230   TCO2 31 08/29/2020 2230   O2SAT 98.0 08/29/2020 2230    CBG (last 3)  Recent Labs    09/04/20 2339 09/05/20 0325 09/05/20 0749  GLUCAP 96 95 101*    Signature:  Coralyn Helling, MD [facility] Pulmonary/Critical Care Pager - [phone] 09/05/2020, 9:46 AM

## 2020-09-05 NOTE — Progress Notes (Signed)
Pt had documented pressure injury on sacrum and heel present on previous admission, on wound assessment 5/18 not found. LDA removed from flowsheet.

## 2020-09-06 DIAGNOSIS — R6521 Severe sepsis with septic shock: Secondary | ICD-10-CM | POA: Diagnosis not present

## 2020-09-06 DIAGNOSIS — J9611 Chronic respiratory failure with hypoxia: Secondary | ICD-10-CM | POA: Diagnosis not present

## 2020-09-06 DIAGNOSIS — K567 Ileus, unspecified: Secondary | ICD-10-CM | POA: Diagnosis not present

## 2020-09-06 LAB — BASIC METABOLIC PANEL
Anion gap: 4 — ABNORMAL LOW (ref 5–15)
BUN: 24 mg/dL — ABNORMAL HIGH (ref 6–20)
CO2: 34 mmol/L — ABNORMAL HIGH (ref 22–32)
Calcium: 10.1 mg/dL (ref 8.9–10.3)
Chloride: 102 mmol/L (ref 98–111)
Creatinine, Ser: 0.77 mg/dL (ref 0.44–1.00)
GFR, Estimated: >60 mL/min (ref >60)
Glucose, Bld: 112 mg/dL — ABNORMAL HIGH (ref 70–99)
Potassium: 3.4 mmol/L — ABNORMAL LOW (ref 3.5–5.1)
Sodium: 140 mmol/L (ref 135–145)

## 2020-09-06 LAB — CBC
HCT: 27.1 % — ABNORMAL LOW (ref 36.0–46.0)
Hemoglobin: 8.3 g/dL — ABNORMAL LOW (ref 12.0–15.0)
MCH: 26.3 pg (ref 26.0–34.0)
MCHC: 30.6 g/dL (ref 30.0–36.0)
MCV: 86 fL (ref 80.0–100.0)
Platelets: 283 10*3/uL (ref 150–400)
RBC: 3.15 MIL/uL — ABNORMAL LOW (ref 3.87–5.11)
RDW: 16.8 % — ABNORMAL HIGH (ref 11.5–15.5)
WBC: 8.8 10*3/uL (ref 4.0–10.5)
nRBC: 0 % (ref 0.0–0.2)

## 2020-09-06 LAB — GLUCOSE, CAPILLARY
Glucose-Capillary: 112 mg/dL — ABNORMAL HIGH (ref 70–99)
Glucose-Capillary: 92 mg/dL (ref 70–99)
Glucose-Capillary: 104 mg/dL — ABNORMAL HIGH (ref 70–99)
Glucose-Capillary: 124 mg/dL — ABNORMAL HIGH (ref 70–99)
Glucose-Capillary: 113 mg/dL — ABNORMAL HIGH (ref 70–99)
Glucose-Capillary: 109 mg/dL — ABNORMAL HIGH (ref 70–99)

## 2020-09-06 LAB — TSH: TSH: 50.069 u[IU]/mL — ABNORMAL HIGH (ref 0.350–4.500)

## 2020-09-06 MED ORDER — SALINE SPRAY 0.65 % NA SOLN
1.0000 | NASAL | Status: DC | PRN
  Administered 2020-09-06 (×2): 1 via NASAL
  Filled 2020-09-06: qty 44

## 2020-09-06 MED ORDER — AMLODIPINE BESYLATE 5 MG PO TABS
5.0000 mg | ORAL_TABLET | Freq: Every day | ORAL | Status: DC
  Administered 2020-09-06: 5 mg
  Filled 2020-09-06: qty 1

## 2020-09-06 MED ORDER — PROSOURCE TF PO LIQD
45.0000 mL | Freq: Two times a day (BID) | ORAL | Status: DC
  Administered 2020-09-06 – 2020-09-18 (×19): 45 mL
  Filled 2020-09-06 (×19): qty 45

## 2020-09-06 MED ORDER — CLONAZEPAM 0.5 MG PO TABS
0.5000 mg | ORAL_TABLET | Freq: Once | ORAL | Status: AC
  Administered 2020-09-06: 0.5 mg via ORAL
  Filled 2020-09-06: qty 1

## 2020-09-06 MED ORDER — FREE WATER
200.0000 mL | Freq: Every day | Status: DC
  Administered 2020-09-06 (×3): 200 mL

## 2020-09-06 MED ORDER — APIXABAN 5 MG PO TABS
5.0000 mg | ORAL_TABLET | Freq: Two times a day (BID) | ORAL | Status: AC
  Administered 2020-09-06 – 2020-09-09 (×6): 5 mg
  Filled 2020-09-06 (×6): qty 1

## 2020-09-06 MED ORDER — LEVOTHYROXINE SODIUM 88 MCG PO TABS
88.0000 ug | ORAL_TABLET | Freq: Every day | ORAL | Status: DC
  Administered 2020-09-07: 88 ug via ORAL
  Filled 2020-09-06: qty 1

## 2020-09-06 MED ORDER — OSMOLITE 1.5 CAL PO LIQD
1000.0000 mL | ORAL | Status: DC
  Administered 2020-09-06 – 2020-09-17 (×9): 1000 mL
  Filled 2020-09-06 (×19): qty 1000

## 2020-09-06 MED ORDER — POTASSIUM CHLORIDE 10 MEQ/100ML IV SOLN: 10.0000 meq | INTRAVENOUS | Status: DC

## 2020-09-06 MED ORDER — POTASSIUM CHLORIDE 20 MEQ PO PACK
40.0000 meq | PACK | Freq: Once | ORAL | Status: AC
  Administered 2020-09-06: 40 meq
  Filled 2020-09-06: qty 2

## 2020-09-06 NOTE — Progress Notes (Signed)
K+ 3.4 Replaced per protocol  

## 2020-09-06 NOTE — Progress Notes (Addendum)
Nutrition Follow-up  DOCUMENTATION CODES:   Non-severe (moderate) malnutrition in context of chronic illness  INTERVENTION:   Advance tube feeding via PEG: Osmolite 1.5 at 20 ml/h, increase by 10 ml every 4 hours to goal rate of 50 ml/h Prosource TF 45 ml BID  Provides 1880 kcal, 97 gm protein, 914 ml free water daily.  Continue MVI with minerals daily via tube.  Continue free water flushes, increase to 200 ml 5 times per day for a total of 1914 ml free water daily.  D/C Juven BID via tube.  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  Ongoing  GOAL:   Patient will meet greater than or equal to 90% of their needs   Unmet  MONITOR:   Diet advancement,PO intake,Vent status,Labs,TF tolerance  REASON FOR ASSESSMENT:   Ventilator,Consult Enteral/tube feeding initiation and management  ASSESSMENT:   55 yo female admitted with decreased responsiveness. In ED, several mucous plugs were removed. 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   Patient reports no abdominal pain this morning with TF running at 20 ml/h all night. She has had a few BMs overnight as well.  S/P swallow evaluation with SLP this morning. Diet advanced to regular with thin liquids. PTA, patient was eating well, however, suspect she was not meeting her nutrition needs PO. Will advance TF to goal rate to meet 100% of estimated needs.   Receiving Osmolite 1.5 at 20 ml/h via PEG. Tolerating well. She has been receiving Juven BID and free water flushes 180 ml 5 times per day via PEG. GI okay with advancing TF gradually to goal rate.  Patient remains intubated on ventilator support MV: 6.8 L/min Temp (24hrs), Avg:98 F (36.7 C), Min:97.9 F (36.6 C), Max:98.2 F (36.8 C)   Labs reviewed. K 3.4 CBG:  112-92-104  Medications reviewed and include Reglan, MVI, Juven, Miralax, Protonix.  IVF: D5 LR at 75 ml/h  I/O +407.5 ml since admission UOP 1520 ml x 24 hours   Diet Order:   Diet Order            Diet regular Room service appropriate? Yes with Assist; Fluid consistency: Thin  Diet effective now                 EDUCATION NEEDS:   No education needs have been identified at this time  Skin:  Skin Assessment: Reviewed RN Assessment Skin Integrity Issues:: Stage II Stage II: N/A  Last BM:  5/19 type 7  Height:   Ht Readings from Last 4 Encounters:  08/29/20 5\' 1"  (1.549 m)  07/21/20 5\' 5"  (1.651 m)  07/12/20 5\' 5"  (1.651 m)  07/09/20 5\' 4"  (1.626 m)    Weight:   Wt Readings from Last 1 Encounters:  09/06/20 52.7 kg    BMI:  Body mass index is 21.95 kg/m.  Estimated Nutritional Needs:   Kcal:  1800-2000  Protein:  80-100 gm  Fluid:  >/= 1.8 L    , RD, LDN, CNSC Please refer to Amion for contact information.

## 2020-09-06 NOTE — Progress Notes (Signed)
Daily Rounding Note  09/06/2020, 11:58 AM  LOS: 8 days   SUBJECTIVE:   Chief complaint:   Ileus  3 bowel movements yesterday and overnight.  Tolerating trickle tube feeds at 20 mL/h.  Still waiting on speech language to assess if she is safe for swallowing.  OBJECTIVE:         Vital signs in last 24 hours:    Temp:  [97.9 F (36.6 C)-98.2 F (36.8 C)] 98 F (36.7 C) (05/19 1134) Pulse Rate:  [64-92] 88 (05/19 1100) Resp:  [16-26] 19 (05/19 1100) BP: (116-170)/(82-109) 160/99 (05/19 1100) SpO2:  [100 %] 100 % (05/19 1140) FiO2 (%):  [40 %] 40 % (05/19 1140) Weight:  [52.7 kg] 52.7 kg (05/19 0500) Last BM Date: 09/06/20 Filed Weights   09/04/20 0403 09/05/20 0427 09/06/20 0500  Weight: 55 kg 55.2 kg 52.7 kg   General: More alert.  Comfortable.  Looks chronically ill. Heart: RRR. Chest: Trach/vent in place.  No labored breathing. Abdomen: Soft.  Nontender.  Active bowel sounds. Neuro/Psych: Thin limbs.  No edema  Intake/Output from previous day: 05/18 0701 - 05/19 0700 In: 2478.8 [I.V.:1780.4; NG/GT:298.3] Out: 1520 [Urine:1520]  Intake/Output this shift: Total I/O In: 579.8 [I.V.:299.8; Other:200; NG/GT:80] Out: 435 [Urine:435]  Lab Results: Recent Labs    09/04/20 0133 09/05/20 0020 09/06/20 0134  WBC 7.1 10.6* 8.8  HGB 7.9* 9.0* 8.3*  HCT 25.5* 30.0* 27.1*  PLT 259 349 283   BMET Recent Labs    09/04/20 0133 09/05/20 0020 09/06/20 0134  NA 138 141 140  K 3.9 3.4* 3.4*  CL 98 100 102  CO2 33* 34* 34*  GLUCOSE 92 90 112*  BUN 11 17 24*  CREATININE 0.80 0.83 0.77  CALCIUM 9.8 10.2 10.1   LFT Recent Labs    09/04/20 0133  PROT 6.5  ALBUMIN 2.4*  AST 22  ALT 12  ALKPHOS 63  BILITOT 0.5   PT/INR No results for input(s): LABPROT, INR in the last 72 hours. Hepatitis Panel No results for input(s): HEPBSAG, HCVAB, HEPAIGM, HEPBIGM in the last 72 hours.  Studies/Results: DG CHEST  PORT 1 VIEW  Result Date: 09/04/2020 CLINICAL DATA:  Dyspnea EXAM: PORTABLE CHEST 1 VIEW COMPARISON:  Radiograph 08/29/2020, CT 02/28/2020 FINDINGS: Tracheostomy tube tip terminates mid to upper trachea approximately 8 cm from the carina. Telemetry leads and external support devices overlie the chest. Chronically coarsened interstitial changes and regions of scarring in both lungs are similar to comparison exam. Blunting of bilateral costophrenic sulci may reflect a combination of hyperinflation and scarring seen on comparison images few nodular opacities in the right upper lobe are unchanged radiographically. No new consolidative process or convincing features of acute pulmonary edema. No pneumothorax. Stable cardiomediastinal contours with a calcified aorta. No acute osseous or soft tissue abnormality. IMPRESSION: No acute cardiopulmonary abnormality. Chronic hyperinflation, coarsened interstitial changes and scarring, similar to prior. Blunting of the costophrenic sulci, favor scarring and hyperinflation as well. Effusions less favored. Stable nodular opacities in the right upper lobe. Aortic Atherosclerosis (ICD10-I70.0). Electronically Signed   By: Lovena Le M.D.   On: 09/04/2020 20:27   Scheduled Meds: . amLODipine  5 mg Per Tube Daily  . apixaban  5 mg Per Tube BID  . arformoterol  15 mcg Nebulization BID  . budesonide  0.25 mg Nebulization BID  . chlorhexidine gluconate (MEDLINE KIT)  15 mL Mouth Rinse BID  . Chlorhexidine Gluconate Cloth  6  each Topical Daily  . clonazePAM  0.5 mg Per Tube TID  . free water  180 mL Per Tube 5 X Daily  . levothyroxine  44 mcg Intravenous Daily  . mouth rinse  15 mL Mouth Rinse 10 times per day  . metoCLOPramide  10 mg Per Tube BID  . multivitamin with minerals  1 tablet Per Tube Daily  . nutrition supplement (JUVEN)  1 packet Per Tube BID BM  . pantoprazole sodium  40 mg Per Tube Daily  . polyethylene glycol  17 g Per Tube Daily  . revefenacin  175 mcg  Nebulization Daily  . rosuvastatin  40 mg Per Tube Daily  . simethicone  40 mg Per Tube Q6H  . venlafaxine  37.5 mg Per Tube BID WC   Continuous Infusions: . sodium chloride Stopped (08/31/20 2128)  . sodium chloride Stopped (08/31/20 1907)  . dexmedetomidine (PRECEDEX) IV infusion Stopped (09/05/20 0016)  . dextrose 5% lactated ringers 75 mL/hr at 09/06/20 1100  . feeding supplement (OSMOLITE 1.5 CAL) 1,000 mL (09/05/20 1605)   PRN Meds:.sodium chloride, acetaminophen, albuterol, bisacodyl, docusate, hydrALAZINE, HYDROmorphone (DILAUDID) injection, ondansetron (ZOFRAN) IV, phenol, sodium chloride, temazepam  ASSESMENT:   *   Ileus.    *   Hypokalemia.  Persists from yest.      PLAN   *   Would titrate rate of Osmolite upward to goal rate (RD should advise as to goal rate).   Add PO if safe.  Await SLP swallowing evaluation.  *   Correct hypokalemia.  Note 40 mEq via tube has been administered couple of hours after the most recent potassium level.  *   GI signing off.  Available as needed.  Patient does not need GI office follow-up.      Azucena Freed  09/06/2020, 11:58 AM Phone [phone]

## 2020-09-06 NOTE — Progress Notes (Signed)
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  Apparently had an episode of desaturation at 85% on home vent.  She has had multiple recent hospitalizations for mucous plugging events resulting in respiratory arrest, followed by pulmonology as an outpatient in the trach clinic.  Recent cuffed 6.0 trach replacement 4/27.  In the ED she was febrile 38.1 WBC 23.7, concern for sepsis and started on antibiotics. Hospital course complicated by persistent ileus. GI / PCCM following.  Assessment & Plan:   Principal Problem:   Severe sepsis with septic shock (CODE) (HCC) Active Problems:   Chronic respiratory failure with hypoxia (HCC)   Hypothyroidism (acquired)   Generalized abdominal pain   Seizure disorder (HCC)   Intractable nausea and vomiting  Principal Problem Chronic respiratory failure with tracheostomy, chronic mechanical ventilation: Severe sepsis with likely pulmonary origin: Patient was initially placed on broad-spectrum antibiotics with vancomycin and Zosyn Blood cultures showing out of 4 bottles  staph species, likely contaminant Urine cultures with Pseudomonas at 50,000 colonies, pansensitive. Given lack of urinary symptoms this may be colonizer She completed 7 days of Zosyn. Continue vent support per PCCM.  Active Problems Ileus : > Improving Ileus has been persistent now for several days, despite conservative measures with NPO, IV fluids, G-tube in intermittent suctioning. She remained distended and quite uncomfortable.  CT scan without acute findings. GI following, appreciate input. Continue supportive treatment. She is clinically better, GI recommended to try slow Enteral foods.  Acute urinary retention: She  had significant retention, required several in and out caths, eventually Foley placed on 5/15.   Once ileus resolves, we will remove Foley.  COPD with recurrent mucous plugging: Respiratory status appears to be stable, at her baseline, vent dependent. PCCM following  Hypothyroidism: TSH significantly elevated, previously normal for several checks, most recent normal TSH in Feb 2022. I wonder whether this is an absorption issue vs correct administration. Regardless, will increase to 88 mcg and repeat TSH in 3 weeks, for now while p.o. administration is questionable due to ileus use IV.  Discussed with patient, she takes Synthroid first thing in the morning by itself so she appears to be administering it correctly.  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 Disposition Plan:    Status is: Inpatient  Remains inpatient appropriate because:Inpatient level of care appropriate due to severity of illness   Dispo: The patient is from: Home              Anticipated d/c is to: Home              Patient currently is not medically stable to d/c.   Difficult to place patient No  Consultants:   PCCM  Procedures: None Antimicrobials:  Anti-infectives (From admission, onward)   Start     Dose/Rate Route Frequency Ordered Stop   08/30/20 1830  vancomycin (VANCOREADY) IVPB 1000 mg/200 mL  Status:  Discontinued        1,000 mg 200 mL/hr over 60 Minutes Intravenous  Every 24 hours 08/30/20 0847 08/30/20 1038   08/30/20 1830  vancomycin (VANCOREADY) IVPB 750 mg/150 mL  Status:  Discontinued        750 mg 150 mL/hr over 60 Minutes Intravenous Every 24 hours 08/30/20  1038 09/01/20 1322   08/30/20 0600  vancomycin (VANCOREADY) IVPB 750 mg/150 mL  Status:  Discontinued        750 mg 150 mL/hr over 60 Minutes Intravenous Every 12 hours 08/29/20 1822 08/30/20 0847   08/30/20 0400  ceFEPIme (MAXIPIME) 2 g in sodium chloride 0.9 % 100 mL IVPB  Status:  Discontinued        2 g 200 mL/hr over 30 Minutes Intravenous Every 8 hours 08/29/20 1822 08/29/20 2038   08/29/20 2200  piperacillin-tazobactam (ZOSYN) IVPB 3.375 g        3.375 g 12.5 mL/hr over 240 Minutes Intravenous Every 8 hours 08/29/20 2038 09/05/20 0140   08/29/20 2030  piperacillin-tazobactam (ZOSYN) IVPB 3.375 g  Status:  Discontinued        3.375 g 100 mL/hr over 30 Minutes Intravenous Every 6 hours 08/29/20 2021 08/29/20 2038   08/29/20 1715  ceFEPIme (MAXIPIME) 2 g in sodium chloride 0.9 % 100 mL IVPB  Status:  Discontinued        2 g 200 mL/hr over 30 Minutes Intravenous  Once 08/29/20 1700 08/29/20 2033   08/29/20 1715  metroNIDAZOLE (FLAGYL) IVPB 500 mg        500 mg 100 mL/hr over 60 Minutes Intravenous  Once 08/29/20 1700 08/29/20 1824   08/29/20 1715  vancomycin (VANCOREADY) IVPB 1250 mg/250 mL        1,250 mg 166.7 mL/hr over 90 Minutes Intravenous  Once 08/29/20 1700 08/29/20 2028      Subjective: Patient was seen and examined at bedside.  Overnight events noted.  She is vent dependent,  has a PEG tube.  She denies any abdominal pain, She has couple of loose stools in the morning,  bowel sounds noted on exam.  Objective: Vitals:   09/06/20 1000 09/06/20 1100 09/06/20 1134 09/06/20 1140  BP: (!) 154/109 (!) 160/99    Pulse: 86 88    Resp: (!) 21 19    Temp:   98 F (36.7 C)   TempSrc:   Oral   SpO2: 100% 100%  100%  Weight:      Height:        Intake/Output Summary (Last 24 hours) at 09/06/2020 1235 Last data filed at 09/06/2020 1100 Gross per 24 hour  Intake 2483.82 ml  Output 1505 ml  Net 978.82 ml   Filed Weights   09/04/20 0403 09/05/20 0427 09/06/20 0500  Weight:  55 kg 55.2 kg 52.7 kg    Examination:  General exam: Appears calm and comfortable, not in any acute distress. Respiratory system: Clear to auscultation. Respiratory effort normal. Cardiovascular system: S1 & S2 heard, RRR. No JVD, murmurs, rubs, gallops or clicks. No pedal edema. Gastrointestinal system: Abdomen is mildly distended, soft and nontender. No organomegaly or masses felt. Normal bowel sounds heard. PEG tube noted. Central nervous system: Alert and oriented x 3. No focal neurological deficits. Extremities: Symmetric 5 x 5 power. No edema, no cyanosis, no clubbing. Skin: No rashes, lesions or ulcers Psychiatry: Judgement and insight appear normal. Mood & affect appropriate.     Data Reviewed: I have personally reviewed following labs and imaging studies  CBC: Recent Labs  Lab 09/02/20 0239 09/03/20 0140 09/04/20 0133 09/05/20 0020 09/06/20 0134  WBC 6.2 8.0 7.1 10.6* 8.8  HGB 7.8* 7.6* 7.9* 9.0* 8.3*  HCT 25.4* 24.6* 25.5* 30.0* 27.1*  MCV 84.4 84.8 85.0 86.2 86.0  PLT 242 252 259 349 599   Basic Metabolic Panel: Recent Labs  Lab 08/31/20 0051 09/01/20 0034 09/01/20 1200 09/02/20 0239 09/03/20 0140 09/04/20 0133 09/05/20 0020 09/06/20 0134  NA 137   < >  --  138 138 138 141 140  K 3.2*   < >  --  5.0 3.7 3.9 3.4* 3.4*  CL 98   < >  --  100 101 98 100 102  CO2 32   < >  --  29 28 33* 34* 34*  GLUCOSE 130*   < >  --  77 73 92 90 112*  BUN 17   < >  --  26* $Re'18 11 17 'UBF$ 24*  CREATININE 0.92   < >  --  0.79 0.77 0.80 0.83 0.77  CALCIUM 9.3   < >  --  10.6* 9.7 9.8 10.2 10.1  MG 1.7  --  2.2  --  1.8  --   --   --   PHOS  --   --   --   --  3.4  --   --   --    < > = values in this interval not displayed.   GFR: Estimated Creatinine Clearance: 58.5 mL/min (by C-G formula based on SCr of 0.77 mg/dL). Liver Function Tests: Recent Labs  Lab 09/01/20 0034 09/03/20 0140 09/04/20 0133  AST $Re'27 23 22  'awQ$ ALT $R'19 15 12  'WI$ ALKPHOS 57 56 63  BILITOT 0.8 0.5 0.5   PROT 7.0 6.4* 6.5  ALBUMIN 2.6* 2.4* 2.4*   No results for input(s): LIPASE, AMYLASE in the last 168 hours. No results for input(s): AMMONIA in the last 168 hours. Coagulation Profile: No results for input(s): INR, PROTIME in the last 168 hours. Cardiac Enzymes: No results for input(s): CKTOTAL, CKMB, CKMBINDEX, TROPONINI in the last 168 hours. BNP (last 3 results) No results for input(s): PROBNP in the last 8760 hours. HbA1C: No results for input(s): HGBA1C in the last 72 hours. CBG: Recent Labs  Lab 09/05/20 1933 09/05/20 2325 09/06/20 0334 09/06/20 0712 09/06/20 1109  GLUCAP 98 106* 112* 92 104*   Lipid Profile: No results for input(s): CHOL, HDL, LDLCALC, TRIG, CHOLHDL, LDLDIRECT in the last 72 hours. Thyroid Function Tests: Recent Labs    09/05/20 0020 09/06/20 0134  TSH  --  50.069*  FREET4 0.71  --    Anemia Panel: No results for input(s): VITAMINB12, FOLATE, FERRITIN, TIBC, IRON, RETICCTPCT in the last 72 hours. Sepsis Labs: No results for input(s): PROCALCITON, LATICACIDVEN in the last 168 hours.  Recent Results (from the past 240 hour(s))  MRSA PCR Screening     Status: None   Collection Time: 08/29/20  4:55 AM   Specimen: Nasal Mucosa; Nasopharyngeal  Result Value Ref Range Status   MRSA by PCR NEGATIVE NEGATIVE Final    Comment:        The GeneXpert MRSA Assay (FDA approved for NASAL specimens only), is one component of a comprehensive MRSA colonization surveillance program. It is not intended to diagnose MRSA infection nor to guide or monitor treatment for MRSA infections. Performed at [facility], [place] [address] [place], [place] [address]   Urine culture     Status: Abnormal   Collection Time: 08/29/20  4:45 PM   Specimen: In/Out Cath Urine  Result Value Ref Range Status   Specimen Description IN/OUT CATH URINE  Final   Special Requests   Final    NONE Performed at [facility], [place] [address] [place], [place] [address]     Culture 50,000 COLONIES/mL PSEUDOMONAS AERUGINOSA (A)  Final   Report Status 08/31/2020 FINAL  Final   Organism ID, Bacteria PSEUDOMONAS AERUGINOSA (A)  Final      Susceptibility   Pseudomonas aeruginosa - MIC*    CEFTAZIDIME <=1 SENSITIVE Sensitive     CIPROFLOXACIN <=0.25 SENSITIVE Sensitive     GENTAMICIN <=1 SENSITIVE Sensitive     IMIPENEM 2 SENSITIVE Sensitive     PIP/TAZO <=4 SENSITIVE Sensitive     CEFEPIME 2 SENSITIVE Sensitive     * 50,000 COLONIES/mL PSEUDOMONAS AERUGINOSA  Blood Culture (routine x 2)     Status: Abnormal   Collection Time: 08/29/20  4:45 PM   Specimen: BLOOD  Result Value Ref Range Status   Specimen Description BLOOD LEFT ANTECUBITAL  Final   Special Requests   Final    BOTTLES DRAWN AEROBIC AND ANAEROBIC Blood Culture adequate volume   Culture  Setup Time   Final    GRAM POSITIVE COCCI IN CLUSTERS AEROBIC BOTTLE ONLY CRITICAL RESULT CALLED TO, READ BACK BY AND VERIFIED WITH: Salome Holmes PHARMD [id number] 08/30/20 A BROWNING    Culture (A)  Final    STAPHYLOCOCCUS CAPITIS THE SIGNIFICANCE OF ISOLATING THIS ORGANISM FROM A SINGLE SET OF BLOOD CULTURES WHEN MULTIPLE SETS ARE DRAWN IS UNCERTAIN. PLEASE NOTIFY THE MICROBIOLOGY DEPARTMENT WITHIN ONE WEEK IF SPECIATION AND SENSITIVITIES ARE REQUIRED. Performed at [facility], [place] [address], [place], [place] [address]    Report Status 08/31/2020 FINAL  Final  Blood Culture ID Panel (Reflexed)     Status: Abnormal   Collection Time: 08/29/20  4:45 PM  Result Value Ref Range Status   Enterococcus faecalis NOT DETECTED NOT DETECTED Final   Enterococcus Faecium NOT DETECTED NOT DETECTED Final   Listeria monocytogenes NOT DETECTED NOT DETECTED Final   Staphylococcus species DETECTED (A) NOT DETECTED Final    Comment: CRITICAL RESULT CALLED TO, READ BACK BY AND VERIFIED WITH: Salome Holmes PHARMD [id number] 08/30/20 A BROWNING    Staphylococcus aureus (BCID) NOT DETECTED NOT DETECTED Final   Staphylococcus epidermidis  NOT DETECTED NOT DETECTED Final   Staphylococcus lugdunensis NOT DETECTED NOT DETECTED Final   Streptococcus species NOT DETECTED NOT DETECTED Final   Streptococcus agalactiae NOT DETECTED NOT DETECTED Final   Streptococcus pneumoniae NOT DETECTED NOT DETECTED Final   Streptococcus pyogenes NOT DETECTED NOT DETECTED Final   A.calcoaceticus-baumannii NOT DETECTED NOT DETECTED Final   Bacteroides fragilis NOT DETECTED NOT DETECTED Final   Enterobacterales NOT DETECTED NOT DETECTED Final   Enterobacter cloacae complex NOT DETECTED NOT DETECTED Final   Escherichia coli NOT DETECTED NOT DETECTED Final   Klebsiella aerogenes NOT DETECTED NOT DETECTED Final   Klebsiella oxytoca NOT DETECTED NOT DETECTED Final   Klebsiella pneumoniae NOT DETECTED NOT DETECTED Final   Proteus species NOT DETECTED NOT DETECTED Final   Salmonella species NOT DETECTED NOT DETECTED Final   Serratia marcescens NOT DETECTED NOT DETECTED Final   Haemophilus influenzae NOT DETECTED NOT DETECTED Final   Neisseria meningitidis NOT DETECTED NOT DETECTED Final   Pseudomonas aeruginosa NOT DETECTED NOT DETECTED Final   Stenotrophomonas maltophilia NOT DETECTED NOT DETECTED Final   Candida albicans NOT DETECTED NOT DETECTED Final   Candida auris NOT DETECTED NOT DETECTED Final  Candida glabrata NOT DETECTED NOT DETECTED Final   Candida krusei NOT DETECTED NOT DETECTED Final   Candida parapsilosis NOT DETECTED NOT DETECTED Final   Candida tropicalis NOT DETECTED NOT DETECTED Final   Cryptococcus neoformans/gattii NOT DETECTED NOT DETECTED Final    Comment: Performed at [facility], [place] [address] [place], [place] [address]  Resp Panel by RT-PCR (Flu A&B, Covid) Nasopharyngeal Swab     Status: None   Collection Time: 08/29/20  5:03 PM   Specimen: Nasopharyngeal Swab; Nasopharyngeal(NP) swabs in vial transport medium  Result Value Ref Range Status   SARS Coronavirus 2 by RT PCR NEGATIVE NEGATIVE Final    Comment:  (NOTE) SARS-CoV-2 target nucleic acids are NOT DETECTED.  The SARS-CoV-2 RNA is generally detectable in upper respiratory specimens during the acute phase of infection. The lowest concentration of SARS-CoV-2 viral copies this assay can detect is 138 copies/mL. A negative result does not preclude SARS-Cov-2 infection and should not be used as the sole basis for treatment or other patient management decisions. A negative result may occur with  improper specimen collection/handling, submission of specimen other than nasopharyngeal swab, presence of viral mutation(s) within the areas targeted by this assay, and inadequate number of viral copies(<138 copies/mL). A negative result must be combined with clinical observations, patient history, and epidemiological information. The expected result is Negative.  Fact Sheet for Patients:  EntrepreneurPulse.com.au  Fact Sheet for Healthcare Providers:  IncredibleEmployment.be  This test is no t yet approved or cleared by the [place] FDA and  has been authorized for detection and/or diagnosis of SARS-CoV-2 by FDA under an Emergency Use Authorization (EUA). This EUA will remain  in effect (meaning this test can be used) for the duration of the COVID-19 declaration under Section 564(b)(1) of the Act, 21 U.S.C.section 360bbb-3(b)(1), unless the authorization is terminated  or revoked sooner.       Influenza A by PCR NEGATIVE NEGATIVE Final   Influenza B by PCR NEGATIVE NEGATIVE Final    Comment: (NOTE) The Xpert Xpress SARS-CoV-2/FLU/RSV plus assay is intended as an aid in the diagnosis of influenza from Nasopharyngeal swab specimens and should not be used as a sole basis for treatment. Nasal washings and aspirates are unacceptable for Xpert Xpress SARS-CoV-2/FLU/RSV testing.  Fact Sheet for Patients: EntrepreneurPulse.com.au  Fact Sheet for Healthcare  Providers: IncredibleEmployment.be  This test is not yet approved or cleared by the [place] FDA and has been authorized for detection and/or diagnosis of SARS-CoV-2 by FDA under an Emergency Use Authorization (EUA). This EUA will remain in effect (meaning this test can be used) for the duration of the COVID-19 declaration under Section 564(b)(1) of the Act, 21 U.S.C. section 360bbb-3(b)(1), unless the authorization is terminated or revoked.  Performed at [facility], [place] [address] [place], [place] [address]   Culture, blood (Routine X 2) w Reflex to ID Panel     Status: None   Collection Time: 08/30/20  2:17 AM   Specimen: BLOOD LEFT FOREARM  Result Value Ref Range Status   Specimen Description BLOOD LEFT FOREARM  Final   Special Requests   Final    BOTTLES DRAWN AEROBIC AND ANAEROBIC Blood Culture results may not be optimal due to an inadequate volume of blood received in culture bottles   Culture   Final    NO GROWTH 5 DAYS Performed at [facility], [place] [address], [place], [place] [address]    Report Status 09/04/2020 FINAL  Final    Radiology Studies: DG CHEST PORT 1 VIEW  Result Date: 09/04/2020 CLINICAL DATA:  Dyspnea EXAM: PORTABLE CHEST 1 VIEW COMPARISON:  Radiograph 08/29/2020, CT 02/28/2020 FINDINGS: Tracheostomy tube tip terminates mid to upper trachea approximately 8 cm from the carina. Telemetry leads and external support devices overlie the chest. Chronically coarsened interstitial changes and regions of scarring in both lungs are similar to comparison exam. Blunting of bilateral costophrenic sulci may reflect a combination of hyperinflation and scarring seen on comparison images few nodular opacities in the right upper lobe are unchanged radiographically. No new consolidative process or convincing features of acute pulmonary edema. No pneumothorax. Stable cardiomediastinal contours with a calcified aorta. No acute osseous or soft  tissue abnormality. IMPRESSION: No acute cardiopulmonary abnormality. Chronic hyperinflation, coarsened interstitial changes and scarring, similar to prior. Blunting of the costophrenic sulci, favor scarring and hyperinflation as well. Effusions less favored. Stable nodular opacities in the right upper lobe. Aortic Atherosclerosis (ICD10-I70.0). Electronically Signed   By: Lovena Le M.D.   On: 09/04/2020 20:27   Scheduled Meds: . amLODipine  5 mg Per Tube Daily  . apixaban  5 mg Per Tube BID  . arformoterol  15 mcg Nebulization BID  . budesonide  0.25 mg Nebulization BID  . chlorhexidine gluconate (MEDLINE KIT)  15 mL Mouth Rinse BID  . Chlorhexidine Gluconate Cloth  6 each Topical Daily  . clonazePAM  0.5 mg Per Tube TID  . free water  180 mL Per Tube 5 X Daily  . levothyroxine  44 mcg Intravenous Daily  . mouth rinse  15 mL Mouth Rinse 10 times per day  . metoCLOPramide  10 mg Per Tube BID  . multivitamin with minerals  1 tablet Per Tube Daily  . nutrition supplement (JUVEN)  1 packet Per Tube BID BM  . pantoprazole sodium  40 mg Per Tube Daily  . polyethylene glycol  17 g Per Tube Daily  . revefenacin  175 mcg Nebulization Daily  . rosuvastatin  40 mg Per Tube Daily  . simethicone  40 mg Per Tube Q6H  . venlafaxine  37.5 mg Per Tube BID WC   Continuous Infusions: . sodium chloride Stopped (08/31/20 2128)  . sodium chloride Stopped (08/31/20 1907)  . dexmedetomidine (PRECEDEX) IV infusion Stopped (09/05/20 0016)  . dextrose 5% lactated ringers 75 mL/hr at 09/06/20 1100  . feeding supplement (OSMOLITE 1.5 CAL) 1,000 mL (09/05/20 1605)     LOS: 8 days    Time spent:  35 mins    Franchon Ketterman, MD [facility]   If 7PM-7AM, please contact night-coverage

## 2020-09-06 NOTE — Progress Notes (Signed)
  Speech Language Pathology Treatment: Dysphagia  Patient Details Name: Kathleen Cameron MRN: [id number] DOB: [birth year] Today's Date: 09/06/2020 Time: [id number] SLP Time Calculation (min) (ACUTE ONLY): 32 min  Assessment / Plan / Recommendation Clinical Impression  Followed up for PO readiness. Pt cleared by GI for POs; previously NPO for ileus. Per spouse report and chart review, pt maintains a PO diet at home despite chronic vent dependency. She only uses PEG for medicines. They deny hx of aspiration PNA. She and spouse are eager to begin PO diet. Assessed with regular, puree, thin, and ice chips. No overt s/sx of aspiration. Vitals also remained stable without drops in SPO2, changes in RR, or HR. Cannot exclude silent aspiration in setting of tracheostomy. Reinforced swallowing strategies from findings of FEES 05/2020. Recommend regular thin liquid diet with meds via PEG and implementation of safe swallowing strategies. Per MD discretion, pt may benefit from in line PMSV if clinically appropriate for use for of improved swallow function (no pharyngeal residuals per FEES with PMSV use and intact airway protection) & improved quality of life for verbal communication. SLP to follow up.     HPI HPI: 55 year old female admitted with recurrent mucous plugging, severe sepsis, chronic respiratory failure with trach, home vent. Apparently had an episode of desaturation at 85% on home vent.  She has had multiple recent hospitalizations for mucous plugging events resulting in respiratory arrest; followed by pulmonology as an outpatient in the trach clinic. Last seen [phone] for trach change.   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 FEES 06/15/20 revealed mild oropharyngeal dysphagia specific to pharyngeal residue when inline PMV was not in place.  Without speaking valve, there was transient penetration of thin liquids on initial trial only, but no further  penetration and no aspiration. Regular diet and thin liquids was recommended.      SLP Plan  Continue with current plan of care (in line PMSV evaluation as clinically appropriate)       Recommendations  Diet recommendations: Regular;Thin liquid Liquids provided via: Cup;Straw Medication Administration: Via alternative means Supervision: Patient able to self feed;Full supervision/cueing for compensatory strategies Compensations: Slow rate;Small sips/bites;Effortful swallow Postural Changes and/or Swallow Maneuvers: Seated upright 90 degrees;Upright 30-60 min after meal                Oral Care Recommendations: Oral care BID Follow up Recommendations: None SLP Visit Diagnosis: Dysphagia, unspecified (R13.10) Plan: Continue with current plan of care (in line PMSV evaluation as clinically appropriate)       GO                Ardyth Gal MA, CCC-SLP Acute Rehabilitation Services   09/06/2020, 1:00 PM

## 2020-09-07 ENCOUNTER — Inpatient Hospital Stay (HOSPITAL_COMMUNITY): Payer: Medicaid Other

## 2020-09-07 DIAGNOSIS — J9622 Acute and chronic respiratory failure with hypercapnia: Secondary | ICD-10-CM

## 2020-09-07 DIAGNOSIS — J9611 Chronic respiratory failure with hypoxia: Secondary | ICD-10-CM | POA: Diagnosis not present

## 2020-09-07 DIAGNOSIS — R6521 Severe sepsis with septic shock: Secondary | ICD-10-CM | POA: Diagnosis not present

## 2020-09-07 DIAGNOSIS — J9621 Acute and chronic respiratory failure with hypoxia: Secondary | ICD-10-CM | POA: Diagnosis not present

## 2020-09-07 DIAGNOSIS — K567 Ileus, unspecified: Secondary | ICD-10-CM | POA: Diagnosis not present

## 2020-09-07 DIAGNOSIS — A419 Sepsis, unspecified organism: Secondary | ICD-10-CM | POA: Diagnosis not present

## 2020-09-07 LAB — CBC
HCT: 31.4 % — ABNORMAL LOW (ref 36.0–46.0)
HCT: 34 % — ABNORMAL LOW (ref 36.0–46.0)
Hemoglobin: 9 g/dL — ABNORMAL LOW (ref 12.0–15.0)
Hemoglobin: 10 g/dL — ABNORMAL LOW (ref 12.0–15.0)
MCH: 25.8 pg — ABNORMAL LOW (ref 26.0–34.0)
MCH: 25.7 pg — ABNORMAL LOW (ref 26.0–34.0)
MCHC: 28.7 g/dL — ABNORMAL LOW (ref 30.0–36.0)
MCHC: 29.4 g/dL — ABNORMAL LOW (ref 30.0–36.0)
MCV: 90 fL (ref 80.0–100.0)
MCV: 87.4 fL (ref 80.0–100.0)
Platelets: 333 10*3/uL (ref 150–400)
Platelets: 424 10*3/uL — ABNORMAL HIGH (ref 150–400)
RBC: 3.49 MIL/uL — ABNORMAL LOW (ref 3.87–5.11)
RBC: 3.89 MIL/uL (ref 3.87–5.11)
RDW: 17 % — ABNORMAL HIGH (ref 11.5–15.5)
RDW: 16.8 % — ABNORMAL HIGH (ref 11.5–15.5)
WBC: 9.1 10*3/uL (ref 4.0–10.5)
WBC: 12.8 10*3/uL — ABNORMAL HIGH (ref 4.0–10.5)
nRBC: 0 % (ref 0.0–0.2)
nRBC: 0 % (ref 0.0–0.2)

## 2020-09-07 LAB — BASIC METABOLIC PANEL
Anion gap: 9 (ref 5–15)
BUN: 19 mg/dL (ref 6–20)
CO2: 28 mmol/L (ref 22–32)
Calcium: 9.3 mg/dL (ref 8.9–10.3)
Chloride: 101 mmol/L (ref 98–111)
Creatinine, Ser: 0.65 mg/dL (ref 0.44–1.00)
GFR, Estimated: >60 mL/min (ref >60)
Glucose, Bld: 117 mg/dL — ABNORMAL HIGH (ref 70–99)
Potassium: 4.1 mmol/L (ref 3.5–5.1)
Sodium: 138 mmol/L (ref 135–145)

## 2020-09-07 LAB — POCT I-STAT 7, (LYTES, BLD GAS, ICA,H+H)
Acid-Base Excess: 9 mmol/L — ABNORMAL HIGH (ref 0.0–2.0)
Bicarbonate: 38.5 mmol/L — ABNORMAL HIGH (ref 20.0–28.0)
Calcium, Ion: 1.37 mmol/L (ref 1.15–1.40)
HCT: 34 % — ABNORMAL LOW (ref 36.0–46.0)
Hemoglobin: 11.6 g/dL — ABNORMAL LOW (ref 12.0–15.0)
O2 Saturation: 91 %
Patient temperature: 98.1
Potassium: 4.1 mmol/L (ref 3.5–5.1)
Sodium: 139 mmol/L (ref 135–145)
TCO2: 41 mmol/L — ABNORMAL HIGH (ref 22–32)
pCO2 arterial: 82.6 mmHg (ref 32.0–48.0)
pH, Arterial: 7.275 — ABNORMAL LOW (ref 7.350–7.450)
pO2, Arterial: 71 mmHg — ABNORMAL LOW (ref 83.0–108.0)

## 2020-09-07 LAB — GLUCOSE, CAPILLARY
Glucose-Capillary: 119 mg/dL — ABNORMAL HIGH (ref 70–99)
Glucose-Capillary: 194 mg/dL — ABNORMAL HIGH (ref 70–99)
Glucose-Capillary: 100 mg/dL — ABNORMAL HIGH (ref 70–99)
Glucose-Capillary: 201 mg/dL — ABNORMAL HIGH (ref 70–99)
Glucose-Capillary: 139 mg/dL — ABNORMAL HIGH (ref 70–99)
Glucose-Capillary: 130 mg/dL — ABNORMAL HIGH (ref 70–99)

## 2020-09-07 LAB — PHOSPHORUS: Phosphorus: 3.8 mg/dL (ref 2.5–4.6)

## 2020-09-07 LAB — LACTIC ACID, PLASMA: Lactic Acid, Venous: 2.7 mmol/L (ref 0.5–1.9)

## 2020-09-07 LAB — MAGNESIUM: Magnesium: 1.8 mg/dL (ref 1.7–2.4)

## 2020-09-07 MED ORDER — LABETALOL HCL 5 MG/ML IV SOLN
10.0000 mg | INTRAVENOUS | Status: DC | PRN
  Administered 2020-09-10 – 2020-09-15 (×5): 10 mg via INTRAVENOUS
  Filled 2020-09-07 (×6): qty 4

## 2020-09-07 MED ORDER — METOCLOPRAMIDE HCL 5 MG/ML IJ SOLN
10.0000 mg | Freq: Three times a day (TID) | INTRAMUSCULAR | Status: DC
  Administered 2020-09-07 – 2020-09-18 (×33): 10 mg via INTRAVENOUS
  Filled 2020-09-07 (×33): qty 2

## 2020-09-07 MED ORDER — AMLODIPINE BESYLATE 5 MG PO TABS: 5.0000 mg | ORAL_TABLET | Freq: Every day | ORAL | Status: DC

## 2020-09-07 MED ORDER — METHYLNALTREXONE BROMIDE 12 MG/0.6ML ~~LOC~~ SOLN
12.0000 mg | Freq: Once | SUBCUTANEOUS | Status: AC
  Administered 2020-09-07: 12 mg via SUBCUTANEOUS
  Filled 2020-09-07 (×2): qty 0.6

## 2020-09-07 MED ORDER — BISACODYL 10 MG RE SUPP: 10.0000 mg | Freq: Every day | RECTAL | Status: DC | PRN

## 2020-09-07 MED ORDER — TEMAZEPAM 15 MG PO CAPS
15.0000 mg | ORAL_CAPSULE | Freq: Every day | ORAL | Status: DC
  Administered 2020-09-08 – 2020-09-17 (×9): 15 mg
  Filled 2020-09-07 (×9): qty 1

## 2020-09-07 MED ORDER — LEVOTHYROXINE SODIUM 88 MCG PO TABS
88.0000 ug | ORAL_TABLET | Freq: Every day | ORAL | Status: DC
  Administered 2020-09-08 – 2020-09-17 (×9): 88 ug
  Filled 2020-09-07 (×9): qty 1

## 2020-09-07 MED ORDER — HYDROMORPHONE HCL 1 MG/ML IJ SOLN
0.2000 mg | INTRAMUSCULAR | Status: DC | PRN
Start: 2020-09-07 — End: 2020-09-18
  Administered 2020-09-07 – 2020-09-17 (×29): 0.2 mg via INTRAVENOUS
  Filled 2020-09-07 (×33): qty 0.5

## 2020-09-07 MED ORDER — MIDAZOLAM HCL 2 MG/2ML IJ SOLN
2.0000 mg | Freq: Once | INTRAMUSCULAR | Status: AC
  Administered 2020-09-07: 2 mg via INTRAVENOUS

## 2020-09-07 MED ORDER — CLONAZEPAM 0.5 MG PO TABS
0.5000 mg | ORAL_TABLET | Freq: Once | ORAL | Status: AC
  Administered 2020-09-07: 0.5 mg via ORAL
  Filled 2020-09-07: qty 1

## 2020-09-07 MED ORDER — CLONAZEPAM 1 MG PO TABS
1.0000 mg | ORAL_TABLET | Freq: Three times a day (TID) | ORAL | Status: DC
  Administered 2020-09-07 – 2020-09-12 (×12): 1 mg
  Filled 2020-09-07 (×13): qty 1

## 2020-09-07 MED ORDER — SODIUM CHLORIDE 0.9 % IV BOLUS
500.0000 mL | Freq: Once | INTRAVENOUS | Status: AC
  Administered 2020-09-07: 500 mL via INTRAVENOUS

## 2020-09-07 MED ORDER — LABETALOL HCL 5 MG/ML IV SOLN: 20.0000 mg | INTRAVENOUS | Status: DC | PRN

## 2020-09-07 MED ORDER — AMLODIPINE BESYLATE 10 MG PO TABS
10.0000 mg | ORAL_TABLET | Freq: Every day | ORAL | Status: DC
  Administered 2020-09-07: 10 mg
  Filled 2020-09-07: qty 1

## 2020-09-07 MED ORDER — ALBUTEROL (5 MG/ML) CONTINUOUS INHALATION SOLN
10.0000 mg/h | INHALATION_SOLUTION | RESPIRATORY_TRACT | Status: DC
  Filled 2020-09-07: qty 20

## 2020-09-07 MED ORDER — LABETALOL HCL 5 MG/ML IV SOLN
INTRAVENOUS | Status: AC
  Administered 2020-09-07: 20 mg via INTRAVENOUS
  Filled 2020-09-07: qty 4

## 2020-09-07 MED ORDER — MIDAZOLAM HCL 2 MG/2ML IJ SOLN
INTRAMUSCULAR | Status: AC
  Filled 2020-09-07: qty 2

## 2020-09-07 MED ORDER — FUROSEMIDE 10 MG/ML IJ SOLN
40.0000 mg | Freq: Once | INTRAMUSCULAR | Status: AC
  Administered 2020-09-07: 40 mg via INTRAVENOUS
  Filled 2020-09-07: qty 4

## 2020-09-07 NOTE — Progress Notes (Signed)
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  Apparently had an episode of desaturation at 85% on home vent.  She has had multiple recent hospitalizations for mucous plugging events resulting in respiratory arrest, followed by pulmonology as an outpatient in the trach clinic.  Recent cuffed 6.0 trach replacement 4/27.  In the ED she was febrile 38.1 WBC 23.7, concern for sepsis and started on antibiotics. Hospital course complicated by persistent ileus. GI / PCCM following.  Assessment & Plan:   Principal Problem:   Severe sepsis with septic shock (CODE) (HCC) Active Problems:   Chronic respiratory failure with hypoxia (HCC)   Hypothyroidism (acquired)   Generalized abdominal pain   Seizure disorder (HCC)   Intractable nausea and vomiting  Principal Problem Chronic respiratory failure with tracheostomy, chronic mechanical ventilation: Severe sepsis with likely pulmonary origin: Patient was initially placed on broad-spectrum antibiotics with vancomycin and Zosyn Blood cultures showing out of 4 bottles  staph species, likely contaminant Urine cultures with Pseudomonas at 50,000 colonies, pansensitive. Given lack of urinary symptoms this may be colonizer She completed 7 days of Zosyn. Continue vent support per PCCM.  Active Problems Ileus : > Improving Ileus had been persistent now for several days, despite conservative measures with NPO, IV fluids, G-tube in intermittent suctioning. She remained distended and quite uncomfortable.  CT scan without acute findings. GI following, appreciate input. Continue supportive treatment. She is clinically better, GI recommended to try slow Enteral foods.  Acute urinary retention: She  had significant retention, required several in and out caths, eventually Foley placed on 5/15.   Once ileus resolves, we will remove Foley.  COPD with recurrent mucous plugging: Respiratory status appears to be stable, at her baseline, vent dependent. PCCM following  Hypothyroidism: TSH significantly elevated, previously normal for several checks, most recent normal TSH in Feb 2022. I wonder whether this is an absorption issue vs correct administration. Regardless, will increase to 88 mcg and repeat TSH in 3 weeks, for now while p.o. administration is questionable due to ileus use IV.  Discussed with patient, she takes Synthroid first thing in the morning by itself so she appears to be administering it correctly.  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 Disposition Plan:    Status is: Inpatient  Remains inpatient appropriate because:Inpatient level of care appropriate due to severity of illness   Dispo: The patient is from: Home              Anticipated d/c is to: Home              Patient currently is not medically stable to d/c.   Difficult to place patient No  Consultants:   PCCM  Procedures: None Antimicrobials:  Anti-infectives (From admission, onward)   Start     Dose/Rate Route Frequency Ordered Stop   08/30/20 1830  vancomycin (VANCOREADY) IVPB 1000 mg/200 mL  Status:  Discontinued        1,000 mg 200 mL/hr over 60 Minutes Intravenous  Every 24 hours 08/30/20 0847 08/30/20 1038   08/30/20 1830  vancomycin (VANCOREADY) IVPB 750 mg/150 mL  Status:  Discontinued        750 mg 150 mL/hr over 60 Minutes Intravenous Every 24 hours 08/30/20  1038 09/01/20 1322   08/30/20 0600  vancomycin (VANCOREADY) IVPB 750 mg/150 mL  Status:  Discontinued        750 mg 150 mL/hr over 60 Minutes Intravenous Every 12 hours 08/29/20 1822 08/30/20 0847   08/30/20 0400  ceFEPIme (MAXIPIME) 2 g in sodium chloride 0.9 % 100 mL IVPB  Status:  Discontinued        2 g 200 mL/hr over 30 Minutes Intravenous Every 8 hours 08/29/20 1822 08/29/20 2038   08/29/20 2200  piperacillin-tazobactam (ZOSYN) IVPB 3.375 g        3.375 g 12.5 mL/hr over 240 Minutes Intravenous Every 8 hours 08/29/20 2038 09/05/20 0140   08/29/20 2030  piperacillin-tazobactam (ZOSYN) IVPB 3.375 g  Status:  Discontinued        3.375 g 100 mL/hr over 30 Minutes Intravenous Every 6 hours 08/29/20 2021 08/29/20 2038   08/29/20 1715  ceFEPIme (MAXIPIME) 2 g in sodium chloride 0.9 % 100 mL IVPB  Status:  Discontinued        2 g 200 mL/hr over 30 Minutes Intravenous  Once 08/29/20 1700 08/29/20 2033   08/29/20 1715  metroNIDAZOLE (FLAGYL) IVPB 500 mg        500 mg 100 mL/hr over 60 Minutes Intravenous  Once 08/29/20 1700 08/29/20 1824   08/29/20 1715  vancomycin (VANCOREADY) IVPB 1250 mg/250 mL        1,250 mg 166.7 mL/hr over 90 Minutes Intravenous  Once 08/29/20 1700 08/29/20 2028      Subjective: Patient was seen and examined at bedside.  Overnight events noted.   She had  respiratory distress last night it,  appears like she had a panic attack. She was given clonazepam.  She calmed down.  She is vent dependent,  has a PEG tube. She denies any abdominal pain,  She has couple of loose stools yesterday,  bowel sounds noted on exam.  Objective: Vitals:   09/07/20 1000 09/07/20 1100 09/07/20 1139 09/07/20 1200  BP: (!) 150/97 (!) 172/93 (!) 152/105 (!) 139/94  Pulse: 94 93  95  Resp:    20  Temp:      TempSrc:      SpO2:   100% 100%  Weight:      Height:        Intake/Output Summary (Last 24 hours) at 09/07/2020 1340 Last data filed at 09/07/2020 1127 Gross per 24 hour   Intake 2257.22 ml  Output 4045 ml  Net -1787.78 ml   Filed Weights   09/05/20 0427 09/06/20 0500 09/07/20 0352  Weight: 55.2 kg 52.7 kg 53.9 kg    Examination:  General exam: Appears calm and comfortable, not in any acute distress. Respiratory system: Clear to auscultation. Respiratory effort normal. Cardiovascular system: S1 & S2 heard, RRR. No JVD, murmurs, rubs, gallops or clicks. No pedal edema. Gastrointestinal system: Abdomen is mildly distended, soft and nontender. No organomegaly or masses felt. Normal bowel sounds heard. PEG tube noted. Central nervous system: Alert and oriented x 3. No focal neurological deficits. Extremities: Symmetric 5 x 5 power. No edema, no cyanosis, no clubbing. Skin: No rashes, lesions or ulcers Psychiatry: Judgement and insight appear normal. Mood & affect appropriate.     Data Reviewed: I have personally  reviewed following labs and imaging studies  CBC: Recent Labs  Lab 09/03/20 0140 09/04/20 0133 09/05/20 0020 09/06/20 0134 09/07/20 0323 09/07/20 0549  WBC 8.0 7.1 10.6* 8.8 9.1  --   HGB 7.6* 7.9* 9.0* 8.3* 9.0* 11.6*  HCT 24.6* 25.5* 30.0* 27.1* 31.4* 34.0*  MCV 84.8 85.0 86.2 86.0 90.0  --   PLT 252 259 349 283 333  --    Basic Metabolic Panel: Recent Labs  Lab 09/01/20 1200 09/02/20 0239 09/03/20 0140 09/04/20 0133 09/05/20 0020 09/06/20 0134 09/07/20 0323 09/07/20 0549  NA  --    < > 138 138 141 140 138 139  K  --    < > 3.7 3.9 3.4* 3.4* 4.1 4.1  CL  --    < > 101 98 100 102 101  --   CO2  --    < > 28 33* 34* 34* 28  --   GLUCOSE  --    < > 73 92 90 112* 117*  --   BUN  --    < > $R'18 11 17 'BN$ 24* 19  --   CREATININE  --    < > 0.77 0.80 0.83 0.77 0.65  --   CALCIUM  --    < > 9.7 9.8 10.2 10.1 9.3  --   MG 2.2  --  1.8  --   --   --  1.8  --   PHOS  --   --  3.4  --   --   --  3.8  --    < > = values in this interval not displayed.   GFR: Estimated Creatinine Clearance: 58.5 mL/min (by C-G formula based on SCr of  0.65 mg/dL). Liver Function Tests: Recent Labs  Lab 09/01/20 0034 09/03/20 0140 09/04/20 0133  AST $Re'27 23 22  'IAF$ ALT $R'19 15 12  'nJ$ ALKPHOS 57 56 63  BILITOT 0.8 0.5 0.5  PROT 7.0 6.4* 6.5  ALBUMIN 2.6* 2.4* 2.4*   No results for input(s): LIPASE, AMYLASE in the last 168 hours. No results for input(s): AMMONIA in the last 168 hours. Coagulation Profile: No results for input(s): INR, PROTIME in the last 168 hours. Cardiac Enzymes: No results for input(s): CKTOTAL, CKMB, CKMBINDEX, TROPONINI in the last 168 hours. BNP (last 3 results) No results for input(s): PROBNP in the last 8760 hours. HbA1C: No results for input(s): HGBA1C in the last 72 hours. CBG: Recent Labs  Lab 09/06/20 1908 09/06/20 2309 09/07/20 0316 09/07/20 0745 09/07/20 1156  GLUCAP 113* 109* 119* 194* 100*   Lipid Profile: No results for input(s): CHOL, HDL, LDLCALC, TRIG, CHOLHDL, LDLDIRECT in the last 72 hours. Thyroid Function Tests: Recent Labs    09/05/20 0020 09/06/20 0134  TSH  --  50.069*  FREET4 0.71  --    Anemia Panel: No results for input(s): VITAMINB12, FOLATE, FERRITIN, TIBC, IRON, RETICCTPCT in the last 72 hours. Sepsis Labs: No results for input(s): PROCALCITON, LATICACIDVEN in the last 168 hours.  Recent Results (from the past 240 hour(s))  MRSA PCR Screening     Status: None   Collection Time: 08/29/20  4:55 AM   Specimen: Nasal Mucosa; Nasopharyngeal  Result Value Ref Range Status   MRSA by PCR NEGATIVE NEGATIVE Final    Comment:        The GeneXpert MRSA Assay (FDA approved for NASAL specimens only), is one component of a comprehensive MRSA colonization surveillance program. It is not intended to diagnose MRSA infection nor  to guide or monitor treatment for MRSA infections. Performed at [facility], [place] [address], [place], [place] [address]   Urine culture     Status: Abnormal   Collection Time: 08/29/20  4:45 PM   Specimen: In/Out Cath Urine  Result Value Ref  Range Status   Specimen Description IN/OUT CATH URINE  Final   Special Requests   Final    NONE Performed at [facility], [place] [address], [place], [place] [address]    Culture 50,000 COLONIES/mL PSEUDOMONAS AERUGINOSA (A)  Final   Report Status 08/31/2020 FINAL  Final   Organism ID, Bacteria PSEUDOMONAS AERUGINOSA (A)  Final      Susceptibility   Pseudomonas aeruginosa - MIC*    CEFTAZIDIME <=1 SENSITIVE Sensitive     CIPROFLOXACIN <=0.25 SENSITIVE Sensitive     GENTAMICIN <=1 SENSITIVE Sensitive     IMIPENEM 2 SENSITIVE Sensitive     PIP/TAZO <=4 SENSITIVE Sensitive     CEFEPIME 2 SENSITIVE Sensitive     * 50,000 COLONIES/mL PSEUDOMONAS AERUGINOSA  Blood Culture (routine x 2)     Status: Abnormal   Collection Time: 08/29/20  4:45 PM   Specimen: BLOOD  Result Value Ref Range Status   Specimen Description BLOOD LEFT ANTECUBITAL  Final   Special Requests   Final    BOTTLES DRAWN AEROBIC AND ANAEROBIC Blood Culture adequate volume   Culture  Setup Time   Final    GRAM POSITIVE COCCI IN CLUSTERS AEROBIC BOTTLE ONLY CRITICAL RESULT CALLED TO, READ BACK BY AND VERIFIED WITH: Salome Holmes PHARMD [id number] 08/30/20 A BROWNING    Culture (A)  Final    STAPHYLOCOCCUS CAPITIS THE SIGNIFICANCE OF ISOLATING THIS ORGANISM FROM A SINGLE SET OF BLOOD CULTURES WHEN MULTIPLE SETS ARE DRAWN IS UNCERTAIN. PLEASE NOTIFY THE MICROBIOLOGY DEPARTMENT WITHIN ONE WEEK IF SPECIATION AND SENSITIVITIES ARE REQUIRED. Performed at [facility], [place] [address], [place], [place] [address]    Report Status 08/31/2020 FINAL  Final  Blood Culture ID Panel (Reflexed)     Status: Abnormal   Collection Time: 08/29/20  4:45 PM  Result Value Ref Range Status   Enterococcus faecalis NOT DETECTED NOT DETECTED Final   Enterococcus Faecium NOT DETECTED NOT DETECTED Final   Listeria monocytogenes NOT DETECTED NOT DETECTED Final   Staphylococcus species DETECTED (A) NOT DETECTED Final    Comment: CRITICAL RESULT  CALLED TO, READ BACK BY AND VERIFIED WITH: Salome Holmes PHARMD [id number] 08/30/20 A BROWNING    Staphylococcus aureus (BCID) NOT DETECTED NOT DETECTED Final   Staphylococcus epidermidis NOT DETECTED NOT DETECTED Final   Staphylococcus lugdunensis NOT DETECTED NOT DETECTED Final   Streptococcus species NOT DETECTED NOT DETECTED Final   Streptococcus agalactiae NOT DETECTED NOT DETECTED Final   Streptococcus pneumoniae NOT DETECTED NOT DETECTED Final   Streptococcus pyogenes NOT DETECTED NOT DETECTED Final   A.calcoaceticus-baumannii NOT DETECTED NOT DETECTED Final   Bacteroides fragilis NOT DETECTED NOT DETECTED Final   Enterobacterales NOT DETECTED NOT DETECTED Final   Enterobacter cloacae complex NOT DETECTED NOT DETECTED Final   Escherichia coli NOT DETECTED NOT DETECTED Final   Klebsiella aerogenes NOT DETECTED NOT DETECTED Final   Klebsiella oxytoca NOT DETECTED NOT DETECTED Final   Klebsiella pneumoniae NOT DETECTED NOT DETECTED Final   Proteus species NOT DETECTED NOT DETECTED Final   Salmonella species NOT DETECTED NOT DETECTED Final   Serratia marcescens NOT DETECTED NOT DETECTED Final   Haemophilus influenzae NOT DETECTED NOT DETECTED  Final   Neisseria meningitidis NOT DETECTED NOT DETECTED Final   Pseudomonas aeruginosa NOT DETECTED NOT DETECTED Final   Stenotrophomonas maltophilia NOT DETECTED NOT DETECTED Final   Candida albicans NOT DETECTED NOT DETECTED Final   Candida auris NOT DETECTED NOT DETECTED Final   Candida glabrata NOT DETECTED NOT DETECTED Final   Candida krusei NOT DETECTED NOT DETECTED Final   Candida parapsilosis NOT DETECTED NOT DETECTED Final   Candida tropicalis NOT DETECTED NOT DETECTED Final   Cryptococcus neoformans/gattii NOT DETECTED NOT DETECTED Final    Comment: Performed at [facility], [place] [address] [place], [place] [address]  Resp Panel by RT-PCR (Flu A&B, Covid) Nasopharyngeal Swab     Status: None   Collection Time: 08/29/20  5:03 PM    Specimen: Nasopharyngeal Swab; Nasopharyngeal(NP) swabs in vial transport medium  Result Value Ref Range Status   SARS Coronavirus 2 by RT PCR NEGATIVE NEGATIVE Final    Comment: (NOTE) SARS-CoV-2 target nucleic acids are NOT DETECTED.  The SARS-CoV-2 RNA is generally detectable in upper respiratory specimens during the acute phase of infection. The lowest concentration of SARS-CoV-2 viral copies this assay can detect is 138 copies/mL. A negative result does not preclude SARS-Cov-2 infection and should not be used as the sole basis for treatment or other patient management decisions. A negative result may occur with  improper specimen collection/handling, submission of specimen other than nasopharyngeal swab, presence of viral mutation(s) within the areas targeted by this assay, and inadequate number of viral copies(<138 copies/mL). A negative result must be combined with clinical observations, patient history, and epidemiological information. The expected result is Negative.  Fact Sheet for Patients:  EntrepreneurPulse.com.au  Fact Sheet for Healthcare Providers:  IncredibleEmployment.be  This test is no t yet approved or cleared by the [place] FDA and  has been authorized for detection and/or diagnosis of SARS-CoV-2 by FDA under an Emergency Use Authorization (EUA). This EUA will remain  in effect (meaning this test can be used) for the duration of the COVID-19 declaration under Section 564(b)(1) of the Act, 21 U.S.C.section 360bbb-3(b)(1), unless the authorization is terminated  or revoked sooner.       Influenza A by PCR NEGATIVE NEGATIVE Final   Influenza B by PCR NEGATIVE NEGATIVE Final    Comment: (NOTE) The Xpert Xpress SARS-CoV-2/FLU/RSV plus assay is intended as an aid in the diagnosis of influenza from Nasopharyngeal swab specimens and should not be used as a sole basis for treatment. Nasal washings and aspirates are  unacceptable for Xpert Xpress SARS-CoV-2/FLU/RSV testing.  Fact Sheet for Patients: EntrepreneurPulse.com.au  Fact Sheet for Healthcare Providers: IncredibleEmployment.be  This test is not yet approved or cleared by the [place] FDA and has been authorized for detection and/or diagnosis of SARS-CoV-2 by FDA under an Emergency Use Authorization (EUA). This EUA will remain in effect (meaning this test can be used) for the duration of the COVID-19 declaration under Section 564(b)(1) of the Act, 21 U.S.C. section 360bbb-3(b)(1), unless the authorization is terminated or revoked.  Performed at [facility], [place] [address] [place],  [address]   Culture, blood (Routine X 2) w Reflex to ID Panel     Status: None   Collection Time: 08/30/20  2:17 AM   Specimen: BLOOD LEFT FOREARM  Result Value Ref Range Status   Specimen Description BLOOD LEFT FOREARM  Final   Special Requests   Final    BOTTLES DRAWN AEROBIC AND ANAEROBIC Blood Culture results may  not be optimal due to an inadequate volume of blood received in culture bottles   Culture   Final    NO GROWTH 5 DAYS Performed at [facility], [place] [address] [place], [place] [address]    Report Status 09/04/2020 FINAL  Final    Radiology Studies: DG Chest Port 1 View  Result Date: 09/07/2020 CLINICAL DATA:  Respiratory failure EXAM: PORTABLE CHEST 1 VIEW COMPARISON:  09/04/2020 FINDINGS: The lungs are symmetrically hyperinflated in keeping with changes of underlying COPD. Mild right basilar scarring is again noted. No superimposed confluent pulmonary infiltrate. No pneumothorax or pleural effusion. Cardiac size within normal limits. Tracheostomy unchanged. No acute bone abnormality. IMPRESSION: Stable examination. COPD. No radiographic evidence of acute cardiopulmonary disease. Electronically Signed   By: Fidela Salisbury MD   On: 09/07/2020 05:49   Scheduled Meds: . amLODipine  10  mg Per Tube Daily  . apixaban  5 mg Per Tube BID  . arformoterol  15 mcg Nebulization BID  . budesonide  0.25 mg Nebulization BID  . chlorhexidine gluconate (MEDLINE KIT)  15 mL Mouth Rinse BID  . Chlorhexidine Gluconate Cloth  6 each Topical Daily  . clonazePAM  1 mg Per Tube TID  . feeding supplement (PROSource TF)  45 mL Per Tube BID  . [START ON 09/08/2020] levothyroxine  88 mcg Per Tube Q0600  . mouth rinse  15 mL Mouth Rinse 10 times per day  . metoCLOPramide  10 mg Per Tube BID  . multivitamin with minerals  1 tablet Per Tube Daily  . pantoprazole sodium  40 mg Per Tube Daily  . polyethylene glycol  17 g Per Tube Daily  . revefenacin  175 mcg Nebulization Daily  . rosuvastatin  40 mg Per Tube Daily  . simethicone  40 mg Per Tube Q6H  . temazepam  15 mg Per Tube QHS  . venlafaxine  37.5 mg Per Tube BID WC   Continuous Infusions: . sodium chloride Stopped (08/31/20 2128)  . sodium chloride Stopped (08/31/20 1907)  . dexmedetomidine (PRECEDEX) IV infusion Stopped (09/05/20 0016)  . feeding supplement (OSMOLITE 1.5 CAL) 1,000 mL (09/07/20 0643)     LOS: 9 days    Time spent:  35 mins    Jedi Catalfamo, MD [facility]   If 7PM-7AM, please contact night-coverage

## 2020-09-07 NOTE — Progress Notes (Signed)
eLink Physician-Brief Progress Note Patient Name: Kathleen Cameron DOB: 17-Dec-1965 MRN: [id number]   Date of Service  09/07/2020  HPI/Events of Note  Hypotension - BP = 84/65 with MAP = 72 post treatment for hypertension.   eICU Interventions  Plan: 1. Bolus with 0.9 NaCl 500 mL IV over 30 minutes now.      Intervention Category Major Interventions: Hypotension - evaluation and management  Moniqua Engebretsen Dennard Nip 09/07/2020, 7:50 PM

## 2020-09-07 NOTE — Progress Notes (Addendum)
PCCM INTERVAL PROGRESS NOTE   Called to bedside for complaints of respiratory distress. Elink has been managing, noting hypertension to 206/113. So far she had received Klonopin 0.5 mg, dilaudid 0.5mg , hydralazine 10mg , lasix 40mg , scheduled budesonide and brovana, as well as PRN albuterol. Despite this she remains hypertensive with systolic greater than 200 and ongoing respiratory distress.   CXR stable  ABG    Component Value Date/Time   PHART 7.275 (L) 09/07/2020 0549   PCO2ART 82.6 (HH) 09/07/2020 0549   PO2ART 71 (L) 09/07/2020 0549   HCO3 38.5 (H) 09/07/2020 0549   TCO2 41 (H) 09/07/2020 0549   O2SAT 91.0 09/07/2020 0549   On exam lungs are tight and she is not moving much air.    Hypertension improved with lasix and labetalol.  Vent rate increased from 16 > 20  She does seem somewhat improved with better BP control, but remains dyssynchronous with high peak pressures.   Placed her on PSV 20/5. Looks like she is wanting a prolonged exhalatory phase. As much as 6 or 7 seconds. Will give her a try on this for a little while with close monitoring.   Give hour long neb   09/09/2020, AGACNP-BC [facility]  See Amion for personal pager PCCM on call pager [phone] until 7pm. Please call Elink 7p-7a. [phone]  09/07/2020 6:12 AM

## 2020-09-07 NOTE — Progress Notes (Signed)
eLink Physician-Brief Progress Note Patient Name: Kathleen Cameron DOB: 23-Nov-1965 MRN: [id number]   Date of Service  09/07/2020  HPI/Events of Note  Anxiety - Scheduled Klonopin held last evening. Patient now anxious.  eICU Interventions  Plan: 1. Klonopin 0.5 mg PO X 1 now.      Intervention Category Major Interventions: Delirium, psychosis, severe agitation - evaluation and management  Dylan Monforte Eugene 09/07/2020, 4:24 AM

## 2020-09-07 NOTE — Progress Notes (Signed)
  Speech Language Pathology Treatment: Dysphagia  Patient Details Name: Kathleen Cameron MRN: [id number] DOB: [birth year] Today's Date: 09/07/2020 Time: 1962-2297 SLP Time Calculation (min) (ACUTE ONLY): 10 min  Assessment / Plan / Recommendation Clinical Impression  Per chart review and discussion with pt/RN, pt has had minimal PO intake today, initially impacted by anxiety this morning. This afternoon she is very drowsy and has not touched her lunch tray. She declined any foods offered by SLP, but did request some cold water. Pt took several sips, taking them in small, single boluses with Mod I. No overt signs of difficulty were noted, but it is still probably best for pt to be more optimally alert for additional PO intake. Will leave current diet order in place, which was recommended from previous FEES. Will f/u for tolerance vs potential need for repeat FEES.    HPI HPI: 55 year old female admitted with recurrent mucous plugging, severe sepsis, chronic respiratory failure with trach, home vent. Apparently had an episode of desaturation at 85% on home vent.  She has had multiple recent hospitalizations for mucous plugging events resulting in respiratory arrest; followed by pulmonology as an outpatient in the trach clinic. Last seen 427 for trach change.   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 FEES 06/15/20 revealed mild oropharyngeal dysphagia specific to pharyngeal residue when inline PMV was not in place.  Without speaking valve, there was transient penetration of thin liquids on initial trial only, but no further penetration and no aspiration. Regular diet and thin liquids was recommended.      SLP Plan  Continue with current plan of care       Recommendations  Diet recommendations: Regular;Thin liquid Liquids provided via: Cup;Straw Medication Administration: Via alternative means Supervision: Patient able to self feed;Full  supervision/cueing for compensatory strategies Compensations: Slow rate;Small sips/bites;Effortful swallow Postural Changes and/or Swallow Maneuvers: Seated upright 90 degrees;Upright 30-60 min after meal                Oral Care Recommendations: Oral care BID Follow up Recommendations: None SLP Visit Diagnosis: Dysphagia, unspecified (R13.10) Plan: Continue with current plan of care       GO                Mahala Menghini., M.A. CCC-SLP Acute Rehabilitation Services Pager [phone] Office [phone]  09/07/2020, 2:44 PM

## 2020-09-07 NOTE — Progress Notes (Addendum)
eLink Physician-Brief Progress Note Patient Name: Kathleen Cameron DOB: [birth year] MRN: [id number]   Date of Service  09/07/2020  HPI/Events of Note  Patient c/o SOB and anxiety. BP = 206/113. Nurse states that the patient has some wheezing. Na+ = 138. She has hx of severe COPD, ventilator dependence and respiratory arrest from mucus plugging in the setting of Corynebacterium pneumonia.  eICU Interventions  Plan: 1. Please give Albuterol neb Rx already ordered.  2. Lasix 40 mg IV X 1 now.  3. D/C Free water. 4. Will request that PCCM ground team evaluate the patient at bedside.     Intervention Category Major Interventions: Other:  Lenell Antu 09/07/2020, 5:17 AM

## 2020-09-07 NOTE — Progress Notes (Signed)
   NAME:  Ariba Lehnen, MRN:  [id number], DOB:  1966/01/23, LOS: 9 ADMISSION DATE:  08/29/2020, CONSULTATION DATE:  08/29/2020 REFERRING MD:  Dr. Allena Katz CHIEF COMPLAINT:  SOB  History of Present Illness:  55 yo female presented to ED with dyspnea and hypoxia with SpO2 85%.  She had fever and found to have mucus plugging.  Started on antibiotics.  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  She is followed in trach clinic [name].  She has hx of respiratory arrest from mucus plugging in the setting of Corynebacterium pneumonia.  Pertinent Medical History:  Anxiety, HTN, Seizures  Significant Hospital Events:  . 5/11 Increasing SOB, presented to [facility] ED, code sepsis called, Pip-tazo>, Vanc> flagyl x1, head ct neg, CT abd pelvis grossly unremarkable, UC> , BC> . 5/12 Anxious overnight, improved with Xanax.  . 5/15 surgery consulted for abdominal pain . 5/16 GI consulted  Tests:   CT head 5/11 >> atrophy and small vessel ischemic changes  CT abd/pelvis 5/11 >> distended stomach  CT abd/pelvis 5/15 >> no acute findings  Interim History / Subjective:  Had a rough night with hypertension and vent desynchrony. Doing better in PS. Able to follow commands. Gave her versed which seems to have also helped.   Objective   Blood pressure (!) 174/94, pulse 88, temperature 98 F (36.7 C), temperature source Oral, resp. rate (!) 23, height 5\' 1"  (1.549 m), weight 53.9 kg, SpO2 100 %.    Vent Mode: PRVC FiO2 (%):  [40 %] 40 % Set Rate:  [16 bmp] 16 bmp Vt Set:  [420 mL] 420 mL PEEP:  [5 cmH20] 5 cmH20 Plateau Pressure:  [16 cmH20-24 cmH20] 24 cmH20   Intake/Output Summary (Last 24 hours) at 09/07/2020 09/09/2020 Last data filed at 09/07/2020 0700 Gross per 24 hour  Intake 2931.97 ml  Output 3220 ml  Net -288.03 ml   Filed Weights   09/05/20 0427 09/06/20 0500 09/07/20 0352  Weight: 55.2 kg 52.7 kg 53.9 kg    Physical  Examination: Constitutional: tired laying in bed  Eyes: EOMI, pupils equal Ears, nose, mouth, and throat: trach  In place, minimal secretions Cardiovascular: RRR, ext warm Respiratory: diminished bilaterally which is her baseline, minimal accessory muscle use Gastrointestinal: soft, +BS Skin: No rashes, normal turgor Neurologic: moves all 4 ext to command Psychiatric: RASS -1  Labs appear benign CXR stable No culture data  Resolved Hospital Problem list     Assessment & Plan:   Acute on chronic hypoxic respiratory failure from tracheobronchitis with mucus plugging- completed ABx 5/17 Chronic trach/vent status. Severe COPD with emphysema. 5/19-5/20 worsening distress- fights vent and de-recruits, no change in secretions or CXR, concurrent hypertension, anxiety.  Almost seems c/w panic attacks perhaps induced by lack of absorption of home meds from last couple days due to ileus - Continue PS - PRN IV benzodiazepines, increase PO klonipin - Increase amlodipine - Husband updated at bedside  Ileus. Hypothyroidism. Seizure disorder. Anxiety. Lt [place] DVT from 06/12/20. Hypothyroidism. Deconditioning. - per primary team  Best practice:  Diet: tube feeds DVT prophylaxis: Eliquis GI prophylaxis: Protonix Mobility: as tolerated Code Status:  full code Disposition: ICU  Updated pt's husband at bedside.  06/14/20 MD PCCM

## 2020-09-07 NOTE — Progress Notes (Signed)
Now hypotensive with increased amlodipine and dilaudid administration along with diuretic overnight.  Still with hypoactive BS and her usual gastroparesis.  Give 500cc LR back. Switch reglan to IV, add dose of relistor to help offload belly which will increase FRC.  Myrla Halsted MD PCCM

## 2020-09-07 NOTE — Progress Notes (Signed)
Nutrition Follow-up  DOCUMENTATION CODES:   Non-severe (moderate) malnutrition in context of chronic illness  INTERVENTION:   Continue tube feeding via PEG: Osmolite 1.5 at 50 ml/h Prosource TF 45 ml BID  Provides 1880 kcal, 97 gm protein, 914 ml free water daily.  Continue MVI with minerals daily via tube.  Continue free water flushes, 200 ml 5 times per day for a total of 1914 ml free water daily.  Continue Regular diet as tolerated.  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  Ongoing  GOAL:   Patient will meet greater than or equal to 90% of their needs   Unmet  MONITOR:   Diet advancement,PO intake,Vent status,Labs,TF tolerance  REASON FOR ASSESSMENT:   Ventilator,Consult Enteral/tube feeding initiation and management  ASSESSMENT:   55 yo female admitted with decreased responsiveness. In ED, several mucous plugs were removed. 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   Discussed patient in ICU rounds and with RN today. TF at goal rate is meeting 100% of estimated needs.   Patient reports no abdominal pain this morning. She has been tolerating TF at goal rate this morning. She is on a regular diet, but did not have any breakfast this morning. RN reports patient had anxiety/panic attack this morning so she was unable to eat breakfast.  Patient remains intubated on ventilator support MV: 6.5 L/min Temp (24hrs), Avg:98.1 F (36.7 C), Min:97.7 F (36.5 C), Max:98.4 F (36.9 C)   Labs reviewed. K 4.1 WNL today CBG: 119-194  Medications reviewed and include Reglan, MVI, Miralax, Protonix.  IVF: D5 LR at 75 ml/h  I/O -1065 ml since admission UOP 3280 ml x 24 hours   Diet Order:   Diet Order            Diet regular Room service appropriate? Yes with Assist; Fluid  consistency: Thin  Diet effective now                 EDUCATION NEEDS:   No education needs have been identified at this time  Skin:  Reviewed RN Assessment  Last BM:  5/19 type 7  Height:   Ht Readings from Last 4 Encounters:  08/29/20 5\' 1"  (1.549 m)  07/21/20 5\' 5"  (1.651 m)  07/12/20 5\' 5"  (1.651 m)  07/09/20 5\' 4"  (1.626 m)    Weight:   Wt Readings from Last 1 Encounters:  09/07/20 53.9 kg    BMI:  Body mass index is 22.45 kg/m.  Estimated Nutritional Needs:   Kcal:  1800-2000  Protein:  80-100 gm  Fluid:  >/= 1.8 L    , RD, LDN, CNSC Please refer to Amion for contact information.

## 2020-09-08 ENCOUNTER — Inpatient Hospital Stay (HOSPITAL_COMMUNITY): Payer: Medicaid Other

## 2020-09-08 DIAGNOSIS — G40909 Epilepsy, unspecified, not intractable, without status epilepticus: Secondary | ICD-10-CM

## 2020-09-08 DIAGNOSIS — A419 Sepsis, unspecified organism: Secondary | ICD-10-CM | POA: Diagnosis not present

## 2020-09-08 DIAGNOSIS — J9611 Chronic respiratory failure with hypoxia: Secondary | ICD-10-CM | POA: Diagnosis not present

## 2020-09-08 DIAGNOSIS — E039 Hypothyroidism, unspecified: Secondary | ICD-10-CM

## 2020-09-08 DIAGNOSIS — R6521 Severe sepsis with septic shock: Secondary | ICD-10-CM | POA: Diagnosis not present

## 2020-09-08 DIAGNOSIS — R1084 Generalized abdominal pain: Secondary | ICD-10-CM | POA: Diagnosis not present

## 2020-09-08 LAB — GLUCOSE, CAPILLARY
Glucose-Capillary: 139 mg/dL — ABNORMAL HIGH (ref 70–99)
Glucose-Capillary: 163 mg/dL — ABNORMAL HIGH (ref 70–99)
Glucose-Capillary: 144 mg/dL — ABNORMAL HIGH (ref 70–99)
Glucose-Capillary: 143 mg/dL — ABNORMAL HIGH (ref 70–99)
Glucose-Capillary: 135 mg/dL — ABNORMAL HIGH (ref 70–99)
Glucose-Capillary: 125 mg/dL — ABNORMAL HIGH (ref 70–99)

## 2020-09-08 LAB — BASIC METABOLIC PANEL
Anion gap: 8 (ref 5–15)
BUN: 33 mg/dL — ABNORMAL HIGH (ref 6–20)
CO2: 36 mmol/L — ABNORMAL HIGH (ref 22–32)
Calcium: 9.9 mg/dL (ref 8.9–10.3)
Chloride: 94 mmol/L — ABNORMAL LOW (ref 98–111)
Creatinine, Ser: 1.32 mg/dL — ABNORMAL HIGH (ref 0.44–1.00)
GFR, Estimated: 47 mL/min — ABNORMAL LOW (ref >60)
Glucose, Bld: 112 mg/dL — ABNORMAL HIGH (ref 70–99)
Potassium: 4.4 mmol/L (ref 3.5–5.1)
Sodium: 138 mmol/L (ref 135–145)

## 2020-09-08 LAB — CBC
HCT: 32.4 % — ABNORMAL LOW (ref 36.0–46.0)
Hemoglobin: 9.7 g/dL — ABNORMAL LOW (ref 12.0–15.0)
MCH: 25.9 pg — ABNORMAL LOW (ref 26.0–34.0)
MCHC: 29.9 g/dL — ABNORMAL LOW (ref 30.0–36.0)
MCV: 86.6 fL (ref 80.0–100.0)
Platelets: 361 10*3/uL (ref 150–400)
RBC: 3.74 MIL/uL — ABNORMAL LOW (ref 3.87–5.11)
RDW: 16.9 % — ABNORMAL HIGH (ref 11.5–15.5)
WBC: 8.6 10*3/uL (ref 4.0–10.5)
nRBC: 0 % (ref 0.0–0.2)

## 2020-09-08 LAB — MAGNESIUM: Magnesium: 2 mg/dL (ref 1.7–2.4)

## 2020-09-08 MED ORDER — CLONAZEPAM 0.5 MG PO TABS
0.5000 mg | ORAL_TABLET | Freq: Once | ORAL | Status: AC
  Administered 2020-09-08: 0.5 mg via ORAL
  Filled 2020-09-08: qty 1

## 2020-09-08 MED ORDER — ENOXAPARIN SODIUM 30 MG/0.3ML IJ SOSY
30.0000 mg | PREFILLED_SYRINGE | INTRAMUSCULAR | Status: DC
  Administered 2020-09-10: 30 mg via SUBCUTANEOUS
  Filled 2020-09-08: qty 0.3

## 2020-09-08 MED ORDER — LACTATED RINGERS IV SOLN: INTRAVENOUS | Status: DC

## 2020-09-08 MED ORDER — BISACODYL 10 MG RE SUPP
10.0000 mg | Freq: Every day | RECTAL | Status: DC
  Administered 2020-09-08 – 2020-09-09 (×2): 10 mg via RECTAL
  Filled 2020-09-08 (×4): qty 1

## 2020-09-08 NOTE — Progress Notes (Signed)
   09/08/20 0750  Tracheostomy Shiley Legacy 6 mm Cuffed  Placement Date/Time: 06/03/20 0039   Inserted prior to hospital arrival?: Yes  Brand: Shiley Legacy  Size (mm): 6 mm  Style: Cuffed  Status Secured  Site Assessment Clean  Site Care Cleansed  Inner Cannula Care Changed/new  Ties Assessment Secure  Cuff Pressure (cm H2O) Green OR 18-26 CmH2O  Tracheostomy Equipment at bedside Yes and checklist posted at head of bed  Adult Ventilator Settings  Vent Type Servo i  Humidity HME  Vent Mode PRVC  Vt Set 420 mL  Set Rate 20 bmp  FiO2 (%) 40 %  I Time 0.8 Sec(s)  PEEP 5 cmH20  Adult Ventilator Measurements  Peak Airway Pressure 42 L/min  Mean Airway Pressure 16 cmH20  Plateau Pressure 28 cmH20  Resp Rate Spontaneous 8 br/min  Resp Rate Total 28 br/min  Exhaled Vt 443 mL  Measured Ve 12.6 mL  I:E Ratio Measured 1:2.7  Auto PEEP 0 cmH20  Total PEEP 5 cmH20  SpO2 100 %  Adult Ventilator Alarms  Alarms On Y  Ve High Alarm 20 L/min  Ve Low Alarm 4 L/min  Resp Rate High Alarm 38 br/min  Resp Rate Low Alarm 10  PEEP Low Alarm 3 cmH2O  Press High Alarm 55 cmH2O  Daily Weaning Assessment  Daily Assessment of Readiness to Wean Wean protocol criteria not met  Reason not met Tracheostomy tube in place (chronic vent)  Breath Sounds  Bilateral Breath Sounds Diminished  Airway Suctioning/Secretions  Suction Type Tracheal  Suction Device  Catheter  Secretion Amount Moderate  Secretion Color White  Secretion Consistency Thick  Suction Tolerance Tolerated well  Suctioning Adverse Effects None

## 2020-09-08 NOTE — Progress Notes (Signed)
eLink Physician-Brief Progress Note Patient Name: Kathleen Cameron DOB: Apr 23, 1965 MRN: [id number]   Date of Service  09/08/2020  HPI/Events of Note  Oliguria   eICU Interventions  Plan: 1. I/O cath PRN.     Intervention Category Major Interventions: Other:  Lenell Antu 09/08/2020, 3:33 AM

## 2020-09-08 NOTE — Progress Notes (Signed)
eLink Physician-Brief Progress Note Patient Name: Maryna Yeagle DOB: May 26, 1965 MRN: [id number]   Date of Service  09/08/2020  HPI/Events of Note  Anxiety - Scheduled Klonopin held last evening. Patient now anxious.  eICU Interventions  Plan: 1. Klonopin 0.5 mg PO X 1 now.      Intervention Category Major Interventions: Delirium, psychosis, severe agitation - evaluation and management  Kimmora Risenhoover [place] 09/08/2020, 5:05 AM

## 2020-09-08 NOTE — Progress Notes (Signed)
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  Apparently had an episode of desaturation at 85% on home vent.  She has had multiple recent hospitalizations for mucous plugging events resulting in respiratory arrest, followed by pulmonology as an outpatient in the trach clinic.  Recent cuffed 6.0 trach replacement 4/27.  In the ED, she was febrile 38.1 WBC 23.7, concern for sepsis and started on antibiotics.   Hospital course complicated by persistent ileus. GI / PCCM following.  Assessment & Plan:   Principal Problem:   Severe sepsis with septic shock (CODE) (HCC) Active Problems:   Chronic respiratory failure with hypoxia (HCC)   Hypothyroidism (acquired)   Generalized abdominal pain   Seizure disorder (HCC)   Intractable nausea and vomiting  Chronic respiratory failure with tracheostomy, chronic mechanical ventilation: Severe sepsis with likely pulmonary origin: Patient completed 7-day course of IV Zosyn.  Blood cultures swelling for bottles of staph bodies likely contaminant.  Urine culture with Pseudomonas 50,000 colony likely colonizer.  Ileus :   Persistent for several days.  G-tube intermittent suction.  CT scan without acute findings.  GI had followed the patient.  Continue to try slow interal feeding.   Acute urinary retention: Patient required several in and out caths and finally Foley was placed on 09/02/2020.  We will plan to remove the Foley once the ileus resolved.  COPD with recurrent mucous plugging: Vent dependent.  Likely to have recurrent admission to the hospital.  PCCM on board for vent management.  Hypothyroidism: Currently on Synthroid 88 mcg daily.  Plan to repeat in 3 weeks.     Generalized abdominal pain  Due to ileus.  History of seizure disorder: Not on antiepileptic medications.  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  DVT prophylaxis:  Eliquis  Code Status:  Full code  Family Communication:  Family at bed side.   Status is: Inpatient  Remains inpatient appropriate because:Inpatient level of care appropriate due to severity of illness   Dispo: The patient is from: Home              Anticipated d/c is to: Home              Patient currently is not medically stable to d/c.   Difficult to place patient No  Consultants:   PCCM  Procedures  Antimicrobials: None currently Anti-infectives (From admission, onward)   Start     Dose/Rate Route Frequency Ordered Stop   08/30/20 1830  vancomycin (VANCOREADY) IVPB 1000 mg/200 mL  Status:  Discontinued        1,000 mg 200 mL/hr over 60 Minutes Intravenous Every 24 hours 08/30/20 0847 08/30/20 1038   08/30/20 1830  vancomycin (VANCOREADY) IVPB 750 mg/150 mL  Status:  Discontinued        750 mg 150 mL/hr over 60 Minutes Intravenous Every 24 hours 08/30/20 1038 09/01/20 1322   08/30/20 0600  vancomycin (VANCOREADY) IVPB 750 mg/150 mL  Status:  Discontinued        750 mg 150 mL/hr over 60 Minutes Intravenous Every 12 hours 08/29/20 1822 08/30/20 0847   08/30/20 0400  ceFEPIme (MAXIPIME) 2 g in sodium chloride 0.9 % 100 mL IVPB  Status:  Discontinued        2 g 200 mL/hr over 30 Minutes Intravenous Every 8 hours 08/29/20 1822 08/29/20 2038   08/29/20 2200  piperacillin-tazobactam (ZOSYN) IVPB 3.375 g        3.375 g 12.5 mL/hr over 240 Minutes Intravenous Every 8 hours 08/29/20 2038 09/05/20 0140   08/29/20 2030  piperacillin-tazobactam (ZOSYN) IVPB 3.375 g  Status:  Discontinued        3.375 g 100 mL/hr over 30 Minutes Intravenous Every 6 hours 08/29/20 2021 08/29/20 2038   08/29/20 1715  ceFEPIme  (MAXIPIME) 2 g in sodium chloride 0.9 % 100 mL IVPB  Status:  Discontinued        2 g 200 mL/hr over 30 Minutes Intravenous  Once 08/29/20 1700 08/29/20 2033   08/29/20 1715  metroNIDAZOLE (FLAGYL) IVPB 500 mg        500 mg 100 mL/hr over 60 Minutes Intravenous  Once 08/29/20 1700 08/29/20 1824   08/29/20 1715  vancomycin (VANCOREADY) IVPB 1250 mg/250 mL        1,250 mg 166.7 mL/hr over 90 Minutes Intravenous  Once 08/29/20 1700 08/29/20 2028      Subjective: Today, patient was seen and examined at bedside.  Alert awake communicative.  Patient denies any abdominal pain, denies any shortness of breath cough chest pain but has mild nausea.  Objective: Vitals:   09/08/20 0700 09/08/20 0743 09/08/20 0748 09/08/20 0750  BP: 119/75     Pulse: 95     Resp: (!) 22     Temp:  98 F (36.7 C)    TempSrc:  Axillary    SpO2: 100%  99% 100%  Weight:      Height:        Intake/Output Summary (Last 24 hours) at 09/08/2020 1032 Last data filed at 09/08/2020 0700 Gross per 24 hour  Intake 1050 ml  Output 800 ml  Net 250 ml   Filed Weights   09/06/20 0500 09/07/20 0352 09/08/20 0600  Weight: 52.7 kg 53.9 kg 54.5 kg    Physical examination: General:  Average built, not in obvious distress, tracheostomy in place HENT:   No scleral pallor or icterus noted. Oral mucosa is moist.  Tracheostomy in place Chest:  Clear breath sounds.  Diminished breath sounds bilaterally. No crackles or wheezes.  CVS: S1 &S2 heard. No murmur.  Regular rate and rhythm. Abdomen: Soft, nontender, nondistended.  Bowel sounds are heard.  PEG tube in place. Extremities: No cyanosis, clubbing or edema.  Peripheral pulses are palpable. Psych: Alert, awake and oriented, normal mood CNS:  No cranial nerve deficits.  Power equal in all extremities.   Skin: Warm and dry.  No rashes noted.   Data Reviewed: I have personally reviewed following labs and imaging studies  CBC: Recent Labs  Lab 09/05/20 0020  09/06/20 0134 09/07/20 0323 09/07/20 0549 09/07/20 1601 09/08/20 0434  WBC 10.6* 8.8 9.1  --  12.8* 8.6  HGB 9.0* 8.3* 9.0* 11.6* 10.0* 9.7*  HCT 30.0* 27.1* 31.4* 34.0* 34.0* 32.4*  MCV 86.2 86.0 90.0  --  87.4 86.6  PLT 349 283 333  --  424* 480   Basic Metabolic Panel: Recent Labs  Lab 09/01/20 1200 09/02/20 0239 09/03/20 0140 09/04/20 0133 09/05/20 0020 09/06/20 0134 09/07/20 0323 09/07/20 0549 09/08/20 0434  NA  --    < > 138 138 141 140 138 139 138  K  --    < > 3.7 3.9 3.4* 3.4*  4.1 4.1 4.4  CL  --    < > 101 98 100 102 101  --  94*  CO2  --    < > 28 33* 34* 34* 28  --  36*  GLUCOSE  --    < > 73 92 90 112* 117*  --  112*  BUN  --    < > _0 24* 19  --  33*  CREATININE  --    < > 0.77 0.80 0.83 0.77 0.65  --  1.32*  CALCIUM  --    < > 9.7 9.8 10.2 10.1 9.3  --  9.9  MG 2.2  --  1.8  --   --   --  1.8  --  2.0  PHOS  --   --  3.4  --   --   --  3.8  --   --    < > = values in this interval not displayed.   GFR: Estimated Creatinine Clearance: 35.5 mL/min (A) (by C-G formula based on SCr of 1.32 mg/dL (H)). Liver Function Tests: Recent Labs  Lab 09/03/20 0140 09/04/20 0133  AST 23 22  ALT 15 12  ALKPHOS 56 63  BILITOT 0.5 0.5  PROT 6.4* 6.5  ALBUMIN 2.4* 2.4*   No results for input(s): LIPASE, AMYLASE in the last 168 hours. No results for input(s): AMMONIA in the last 168 hours. Coagulation Profile: No results for input(s): INR, PROTIME in the last 168 hours. Cardiac Enzymes: No results for input(s): CKTOTAL, CKMB, CKMBINDEX, TROPONINI in the last 168 hours. BNP (last 3 results) No results for input(s): PROBNP in the last 8760 hours. HbA1C: No results for input(s): HGBA1C in the last 72 hours. CBG: Recent Labs  Lab 09/07/20 1539 09/07/20 1912 09/07/20 2313 09/08/20 0317 09/08/20 0711  GLUCAP 201* 139* 130* 139* 163*   Lipid Profile: No results for input(s): CHOL, HDL, LDLCALC, TRIG, CHOLHDL, LDLDIRECT in the last 72 hours. Thyroid  Function Tests: Recent Labs    09/06/20 0134  TSH 50.069*   Anemia Panel: No results for input(s): VITAMINB12, FOLATE, FERRITIN, TIBC, IRON, RETICCTPCT in the last 72 hours. Sepsis Labs: Recent Labs  Lab 09/07/20 1601  LATICACIDVEN 2.7*    Recent Results (from the past 240 hour(s))  Urine culture     Status: Abnormal   Collection Time: 08/29/20  4:45 PM   Specimen: In/Out Cath Urine  Result Value Ref Range Status   Specimen Description IN/OUT CATH URINE  Final   Special Requests   Final    NONE Performed at [facility], [address] [place], [place] [address]    Culture 50,000 COLONIES/mL PSEUDOMONAS AERUGINOSA (A)  Final   Report Status 08/31/2020 FINAL  Final   Organism ID, Bacteria PSEUDOMONAS AERUGINOSA (A)  Final      Susceptibility   Pseudomonas aeruginosa - MIC*    CEFTAZIDIME <=1 SENSITIVE Sensitive     CIPROFLOXACIN <=0.25 SENSITIVE Sensitive     GENTAMICIN <=1 SENSITIVE Sensitive     IMIPENEM 2 SENSITIVE Sensitive     PIP/TAZO <=4 SENSITIVE Sensitive     CEFEPIME 2 SENSITIVE Sensitive     * 50,000 COLONIES/mL PSEUDOMONAS AERUGINOSA  Blood Culture (routine x 2)     Status: Abnormal   Collection Time: 08/29/20  4:45 PM   Specimen: BLOOD  Result Value Ref Range Status   Specimen Description BLOOD LEFT ANTECUBITAL  Final   Special Requests   Final  BOTTLES DRAWN AEROBIC AND ANAEROBIC Blood Culture adequate volume   Culture  Setup Time   Final    GRAM POSITIVE COCCI IN CLUSTERS AEROBIC BOTTLE ONLY CRITICAL RESULT CALLED TO, READ BACK BY AND VERIFIED WITH: Salome Holmes PHARMD [id number] 08/30/20 A BROWNING    Culture (A)  Final    STAPHYLOCOCCUS CAPITIS THE SIGNIFICANCE OF ISOLATING THIS ORGANISM FROM A SINGLE SET OF BLOOD CULTURES WHEN MULTIPLE SETS ARE DRAWN IS UNCERTAIN. PLEASE NOTIFY THE MICROBIOLOGY DEPARTMENT WITHIN ONE WEEK IF SPECIATION AND SENSITIVITIES ARE REQUIRED. Performed at [facility], [place] [address], [place], [place] [address]     Report Status 08/31/2020 FINAL  Final  Blood Culture ID Panel (Reflexed)     Status: Abnormal   Collection Time: 08/29/20  4:45 PM  Result Value Ref Range Status   Enterococcus faecalis NOT DETECTED NOT DETECTED Final   Enterococcus Faecium NOT DETECTED NOT DETECTED Final   Listeria monocytogenes NOT DETECTED NOT DETECTED Final   Staphylococcus species DETECTED (A) NOT DETECTED Final    Comment: CRITICAL RESULT CALLED TO, READ BACK BY AND VERIFIED WITH: Salome Holmes PHARMD [id number] 08/30/20 A BROWNING    Staphylococcus aureus (BCID) NOT DETECTED NOT DETECTED Final   Staphylococcus epidermidis NOT DETECTED NOT DETECTED Final   Staphylococcus lugdunensis NOT DETECTED NOT DETECTED Final   Streptococcus species NOT DETECTED NOT DETECTED Final   Streptococcus agalactiae NOT DETECTED NOT DETECTED Final   Streptococcus pneumoniae NOT DETECTED NOT DETECTED Final   Streptococcus pyogenes NOT DETECTED NOT DETECTED Final   A.calcoaceticus-baumannii NOT DETECTED NOT DETECTED Final   Bacteroides fragilis NOT DETECTED NOT DETECTED Final   Enterobacterales NOT DETECTED NOT DETECTED Final   Enterobacter cloacae complex NOT DETECTED NOT DETECTED Final   Escherichia coli NOT DETECTED NOT DETECTED Final   Klebsiella aerogenes NOT DETECTED NOT DETECTED Final   Klebsiella oxytoca NOT DETECTED NOT DETECTED Final   Klebsiella pneumoniae NOT DETECTED NOT DETECTED Final   Proteus species NOT DETECTED NOT DETECTED Final   Salmonella species NOT DETECTED NOT DETECTED Final   Serratia marcescens NOT DETECTED NOT DETECTED Final   Haemophilus influenzae NOT DETECTED NOT DETECTED Final   Neisseria meningitidis NOT DETECTED NOT DETECTED Final   Pseudomonas aeruginosa NOT DETECTED NOT DETECTED Final   Stenotrophomonas maltophilia NOT DETECTED NOT DETECTED Final   Candida albicans NOT DETECTED NOT DETECTED Final   Candida auris NOT DETECTED NOT DETECTED Final   Candida glabrata NOT DETECTED NOT DETECTED Final   Candida  krusei NOT DETECTED NOT DETECTED Final   Candida parapsilosis NOT DETECTED NOT DETECTED Final   Candida tropicalis NOT DETECTED NOT DETECTED Final   Cryptococcus neoformans/gattii NOT DETECTED NOT DETECTED Final    Comment: Performed at [facility], [address], [place], [place] [address]  Resp Panel by RT-PCR (Flu A&B, Covid) Nasopharyngeal Swab     Status: None   Collection Time: 08/29/20  5:03 PM   Specimen: Nasopharyngeal Swab; Nasopharyngeal(NP) swabs in vial transport medium  Result Value Ref Range Status   SARS Coronavirus 2 by RT PCR NEGATIVE NEGATIVE Final    Comment: (NOTE) SARS-CoV-2 target nucleic acids are NOT DETECTED.  The SARS-CoV-2 RNA is generally detectable in upper respiratory specimens during the acute phase of infection. The lowest concentration of SARS-CoV-2 viral copies this assay can detect is 138 copies/mL. A negative result does not preclude SARS-Cov-2 infection and should not be used as the sole basis for treatment or other patient management decisions. A negative result  may occur with  improper specimen collection/handling, submission of specimen other than nasopharyngeal swab, presence of viral mutation(s) within the areas targeted by this assay, and inadequate number of viral copies(<138 copies/mL). A negative result must be combined with clinical observations, patient history, and epidemiological information. The expected result is Negative.  Fact Sheet for Patients:  EntrepreneurPulse.com.au  Fact Sheet for Healthcare Providers:  IncredibleEmployment.be  This test is no t yet approved or cleared by the [place] FDA and  has been authorized for detection and/or diagnosis of SARS-CoV-2 by FDA under an Emergency Use Authorization (EUA). This EUA will remain  in effect (meaning this test can be used) for the duration of the COVID-19 declaration under Section 564(b)(1) of the Act, 21 U.S.C.section  360bbb-3(b)(1), unless the authorization is terminated  or revoked sooner.       Influenza A by PCR NEGATIVE NEGATIVE Final   Influenza B by PCR NEGATIVE NEGATIVE Final    Comment: (NOTE) The Xpert Xpress SARS-CoV-2/FLU/RSV plus assay is intended as an aid in the diagnosis of influenza from Nasopharyngeal swab specimens and should not be used as a sole basis for treatment. Nasal washings and aspirates are unacceptable for Xpert Xpress SARS-CoV-2/FLU/RSV testing.  Fact Sheet for Patients: EntrepreneurPulse.com.au  Fact Sheet for Healthcare Providers: IncredibleEmployment.be  This test is not yet approved or cleared by the [place] FDA and has been authorized for detection and/or diagnosis of SARS-CoV-2 by FDA under an Emergency Use Authorization (EUA). This EUA will remain in effect (meaning this test can be used) for the duration of the COVID-19 declaration under Section 564(b)(1) of the Act, 21 U.S.C. section 360bbb-3(b)(1), unless the authorization is terminated or revoked.  Performed at [facility], [place] [address] [place], [place] [address]   Culture, blood (Routine X 2) w Reflex to ID Panel     Status: None   Collection Time: 08/30/20  2:17 AM   Specimen: BLOOD LEFT FOREARM  Result Value Ref Range Status   Specimen Description BLOOD LEFT FOREARM  Final   Special Requests   Final    BOTTLES DRAWN AEROBIC AND ANAEROBIC Blood Culture results may not be optimal due to an inadequate volume of blood received in culture bottles   Culture   Final    NO GROWTH 5 DAYS Performed at [facility], [place] [address] [place], [place] [address]    Report Status 09/04/2020 FINAL  Final    Radiology Studies: DG Abd 1 View  Result Date: 09/08/2020 CLINICAL DATA:  Abdominal distension. Evaluate bowel pattern. Ileus. EXAM: ABDOMEN - 1 VIEW COMPARISON:  Sep 04, 2020 FINDINGS: Stable PEG tube. No free air, portal venous gas, or  pneumatosis. Air-filled mildly prominent loops of bowel are identified, improved in the interval. No other interval changes. IMPRESSION: Improving ileus.  Recommend attention on follow-up. Electronically Signed   By: Dorise Bullion III M.D   On: 09/08/2020 09:55   DG Chest Port 1 View  Result Date: 09/07/2020 CLINICAL DATA:  Respiratory failure EXAM: PORTABLE CHEST 1 VIEW COMPARISON:  09/04/2020 FINDINGS: The lungs are symmetrically hyperinflated in keeping with changes of underlying COPD. Mild right basilar scarring is again noted. No superimposed confluent pulmonary infiltrate. No pneumothorax or pleural effusion. Cardiac size within normal limits. Tracheostomy unchanged. No acute bone abnormality. IMPRESSION: Stable examination. COPD. No radiographic evidence of acute cardiopulmonary disease. Electronically Signed   By: Fidela Salisbury MD   On: 09/07/2020 05:49   Scheduled Meds: . apixaban  5  mg Per Tube BID  . arformoterol  15 mcg Nebulization BID  . bisacodyl  10 mg Rectal Q0600  . budesonide  0.25 mg Nebulization BID  . chlorhexidine gluconate (MEDLINE KIT)  15 mL Mouth Rinse BID  . Chlorhexidine Gluconate Cloth  6 each Topical Daily  . clonazePAM  1 mg Per Tube TID  . [START ON 09/10/2020] enoxaparin (LOVENOX) injection  30 mg Subcutaneous Q24H  . feeding supplement (PROSource TF)  45 mL Per Tube BID  . levothyroxine  88 mcg Per Tube Q0600  . mouth rinse  15 mL Mouth Rinse 10 times per day  . metoCLOPramide (REGLAN) injection  10 mg Intravenous Q8H  . multivitamin with minerals  1 tablet Per Tube Daily  . pantoprazole sodium  40 mg Per Tube Daily  . polyethylene glycol  17 g Per Tube Daily  . revefenacin  175 mcg Nebulization Daily  . rosuvastatin  40 mg Per Tube Daily  . simethicone  40 mg Per Tube Q6H  . temazepam  15 mg Per Tube QHS  . venlafaxine  37.5 mg Per Tube BID WC   Continuous Infusions: . sodium chloride Stopped (08/31/20 2128)  . sodium chloride Stopped (08/31/20  1907)  . dexmedetomidine (PRECEDEX) IV infusion Stopped (09/05/20 0016)  . feeding supplement (OSMOLITE 1.5 CAL) 1,000 mL (09/07/20 0643)  . lactated ringers 100 mL/hr at 09/08/20 0814     LOS: 10 days    Time spent:  35 mins    Flora Lipps, MD [facility]   If 7PM-7AM, please contact night-coverage

## 2020-09-08 NOTE — Progress Notes (Addendum)
NAME:  Versa Craton, MRN:  [id number], DOB:  [birth year], LOS: 10 ADMISSION DATE:  08/29/2020, CONSULTATION DATE:  08/29/2020 REFERRING MD:  Dr. Allena Katz CHIEF COMPLAINT:  SOB  History of Present Illness:  55 yo female presented to ED with dyspnea and hypoxia with SpO2 85%.  She had fever and found to have mucus plugging.  Started on antibiotics.  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  She is followed in trach clinic [name].  She has hx of respiratory arrest from mucus plugging in the setting of Corynebacterium pneumonia.  Pertinent Medical History:  Anxiety, HTN, Seizures  Significant Hospital Events:  . 5/11 Increasing SOB, presented to [facility] ED, code sepsis called, Pip-tazo>, Vanc> flagyl x1, head ct neg, CT abd pelvis grossly unremarkable, UC> , BC> . 5/12 Anxious overnight, improved with Xanax.  . 5/15 surgery consulted for abdominal pain . 5/16 GI consulted . 5/20 anxiety driven worsening respiratory mechanics; hypotension, mild AKI  Tests:   CT head 5/11 >> atrophy and small vessel ischemic changes  CT abd/pelvis 5/11 >> distended stomach  CT abd/pelvis 5/15 >> no acute findings  Interim History / Subjective:  Still anxious this AM but not fighting vent as much. Says she had BM yesterday.  Objective   Blood pressure 129/87, pulse (!) 104, temperature 98.2 F (36.8 C), temperature source Oral, resp. rate (!) 24, height 5\' 1"  (1.549 m), weight 54.5 kg, SpO2 100 %.    Vent Mode: PRVC FiO2 (%):  [40 %-50 %] 50 % Set Rate:  [16 bmp-20 bmp] 20 bmp Vt Set:  [420 mL] 420 mL PEEP:  [5 cmH20] 5 cmH20 Pressure Support:  [20 cmH20] 20 cmH20 Plateau Pressure:  [20 cmH20-27 cmH20] 27 cmH20   Intake/Output Summary (Last 24 hours) at 09/08/2020 09/10/2020 Last data filed at 09/08/2020 0600 Gross per 24 hour  Intake 1150 ml  Output 1800 ml  Net -650 ml   Filed Weights   09/06/20 0500 09/07/20 0352 09/08/20 0600  Weight: 52.7  kg 53.9 kg 54.5 kg    Physical Examination: Constitutional: chronically ill anxious woman on vent  Eyes: EOMI, pupils equal Ears, nose, mouth, and throat: trach in place with minimal secretions, +temporal wasting Cardiovascular: RRR, ext warm Respiratory: mildly tachypneic, severely diminished (baseline), less accessory muscle use than 5/20 Gastrointestinal: protuberant, tympanic, hypoactive BS Skin: No rashes, normal turgor Neurologic: moves all 4 ext to command Psychiatric: RASS 0   Labs appear benign CXR stable No culture data  Resolved Hospital Problem list     Assessment & Plan:   Acute on chronic hypoxic respiratory failure from tracheobronchitis with mucus plugging- completed ABx 5/17 Chronic trach/vent status. 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 - continue synthroid, recheck TSH in a few weeks  Seizure disorder.  Lt [place] DVT from 06/12/20. - Can change eliquis to lovenox DVT ppx starting 5/23 as she will be 3 months out  Recurrent  admissions- she and husband are adamant against SNF and LTACH; unfortunately this means recurrent admissions as her baseline clinical status cannot be managed at home.  Multiple PCCM and palliative providers have discussed this with family.  May be worth ethics consult, will consider Monday.  Best practice:  Diet: PO diet DVT prophylaxis: Eliquis GI prophylaxis:  Protonix Mobility: as tolerated Code Status:  full code Disposition: ICU   Patient critically ill due to ileus, respiratory failure, hypotension Interventions to address this today fluids, vent titration, sedation titration Risk of deterioration without these interventions is high  I personally spent 35 minutes providing critical care not including any separately billable procedures  Myrla Halsted MD [facility]  Prefer epic messenger for cross cover needs If after hours, please call E-link

## 2020-09-09 ENCOUNTER — Inpatient Hospital Stay (HOSPITAL_COMMUNITY): Payer: Medicaid Other

## 2020-09-09 DIAGNOSIS — J9611 Chronic respiratory failure with hypoxia: Secondary | ICD-10-CM | POA: Diagnosis not present

## 2020-09-09 DIAGNOSIS — R1084 Generalized abdominal pain: Secondary | ICD-10-CM | POA: Diagnosis not present

## 2020-09-09 DIAGNOSIS — E039 Hypothyroidism, unspecified: Secondary | ICD-10-CM | POA: Diagnosis not present

## 2020-09-09 DIAGNOSIS — A419 Sepsis, unspecified organism: Secondary | ICD-10-CM | POA: Diagnosis not present

## 2020-09-09 DIAGNOSIS — R6521 Severe sepsis with septic shock: Secondary | ICD-10-CM | POA: Diagnosis not present

## 2020-09-09 LAB — CBC
HCT: 31.6 % — ABNORMAL LOW (ref 36.0–46.0)
Hemoglobin: 9.5 g/dL — ABNORMAL LOW (ref 12.0–15.0)
MCH: 26 pg (ref 26.0–34.0)
MCHC: 30.1 g/dL (ref 30.0–36.0)
MCV: 86.3 fL (ref 80.0–100.0)
Platelets: 272 10*3/uL (ref 150–400)
RBC: 3.66 MIL/uL — ABNORMAL LOW (ref 3.87–5.11)
RDW: 17.2 % — ABNORMAL HIGH (ref 11.5–15.5)
WBC: 10.1 10*3/uL (ref 4.0–10.5)
nRBC: 0 % (ref 0.0–0.2)

## 2020-09-09 LAB — GLUCOSE, CAPILLARY
Glucose-Capillary: 133 mg/dL — ABNORMAL HIGH (ref 70–99)
Glucose-Capillary: 125 mg/dL — ABNORMAL HIGH (ref 70–99)
Glucose-Capillary: 123 mg/dL — ABNORMAL HIGH (ref 70–99)
Glucose-Capillary: 110 mg/dL — ABNORMAL HIGH (ref 70–99)
Glucose-Capillary: 109 mg/dL — ABNORMAL HIGH (ref 70–99)
Glucose-Capillary: 75 mg/dL (ref 70–99)

## 2020-09-09 LAB — BASIC METABOLIC PANEL
Anion gap: 7 (ref 5–15)
BUN: 37 mg/dL — ABNORMAL HIGH (ref 6–20)
CO2: 35 mmol/L — ABNORMAL HIGH (ref 22–32)
Calcium: 9.6 mg/dL (ref 8.9–10.3)
Chloride: 94 mmol/L — ABNORMAL LOW (ref 98–111)
Creatinine, Ser: 1.09 mg/dL — ABNORMAL HIGH (ref 0.44–1.00)
GFR, Estimated: 59 mL/min — ABNORMAL LOW (ref >60)
Glucose, Bld: 124 mg/dL — ABNORMAL HIGH (ref 70–99)
Potassium: 4.5 mmol/L (ref 3.5–5.1)
Sodium: 136 mmol/L (ref 135–145)

## 2020-09-09 LAB — MAGNESIUM: Magnesium: 2 mg/dL (ref 1.7–2.4)

## 2020-09-09 LAB — PHOSPHORUS: Phosphorus: 2.5 mg/dL (ref 2.5–4.6)

## 2020-09-09 MED ORDER — HYDROMORPHONE HCL 1 MG/ML IJ SOLN
0.2000 mg | Freq: Once | INTRAMUSCULAR | Status: AC
  Administered 2020-09-09: 0.2 mg via INTRAVENOUS
  Filled 2020-09-09: qty 0.5

## 2020-09-09 NOTE — Progress Notes (Signed)
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  Apparently had an episode of desaturation at 85% on home vent.  She has had multiple recent hospitalizations for mucous plugging events resulting in respiratory arrest, followed by pulmonology as an outpatient in the trach clinic.  Recent cuffed 6.0 trach replacement 4/27.  In the ED, she was febrile 38.1 WBC 23.7, concern for sepsis and started on antibiotics. Hospital course complicated by persistent ileus. GI / PCCM following.  Assessment & Plan:   Principal Problem:   Severe sepsis with septic shock (CODE) (HCC) Active Problems:   Chronic respiratory failure with hypoxia (HCC)   Hypothyroidism (acquired)   Generalized abdominal pain   Seizure disorder (HCC)   Intractable nausea and vomiting  Chronic respiratory failure with tracheostomy, chronic mechanical ventilation: Severe sepsis with likely pulmonary origin: Patient completed 7-day course of IV Zosyn.  Blood cultures suggestive for bottles of staph bodies likely contaminant.  Urine culture with Pseudomonas 50,000 colony likely colonizer.  Ileus :   Persistent for several days.  G-tube on intermittent suction.  CT scan without acute findings.   Continue to try slow interal feeding.    Acute urinary retention: Patient required several in and out caths and finally foley was placed on 09/02/2020.  We will plan to remove the Foley once the ileus resolved.  COPD with recurrent mucous plugging: Vent dependent.  Likely to have recurrent admission to the hospital.  PCCM on board for vent management.   Hypothyroidism: Currently on Synthroid 88 mcg daily.  Plan to repeat in 3 weeks.    Generalized abdominal pain   Due to ileus.  History of seizure disorder: Not on antiepileptic medications.  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  DVT prophylaxis:  Eliquis  Code Status:  Full code  Family Communication: I spoke with the patient's husband at bedside.  Status is: Inpatient  Remains inpatient appropriate because:Inpatient level of care appropriate due to severity of illness   Dispo: The patient is from: Home              Anticipated d/c is to: Home              Patient currently is not medically stable to d/c.   Difficult to place patient No  Consultants:   PCCM  Procedures  Antimicrobials: None currently Anti-infectives (From admission, onward)   Start     Dose/Rate Route Frequency Ordered Stop   08/30/20 1830  vancomycin (VANCOREADY) IVPB 1000 mg/200 mL  Status:  Discontinued        1,000 mg 200 mL/hr over 60 Minutes Intravenous Every 24 hours 08/30/20 0847 08/30/20 1038   08/30/20 1830  vancomycin (VANCOREADY) IVPB 750 mg/150 mL  Status:  Discontinued        750 mg 150 mL/hr over 60 Minutes Intravenous Every 24 hours 08/30/20 1038 09/01/20 1322   08/30/20 0600  vancomycin (VANCOREADY) IVPB 750 mg/150 mL  Status:  Discontinued        750 mg 150 mL/hr over 60 Minutes Intravenous Every 12 hours 08/29/20 1822 08/30/20 0847   08/30/20 0400  ceFEPIme (MAXIPIME) 2 g in sodium chloride 0.9 % 100 mL IVPB  Status:  Discontinued        2 g 200 mL/hr over 30 Minutes Intravenous Every 8 hours 08/29/20 1822 08/29/20 2038   08/29/20 2200  piperacillin-tazobactam (ZOSYN) IVPB 3.375 g        3.375 g 12.5 mL/hr over 240 Minutes Intravenous Every 8 hours 08/29/20 2038 09/05/20 0140   08/29/20 2030  piperacillin-tazobactam (ZOSYN) IVPB 3.375 g  Status:  Discontinued        3.375 g 100 mL/hr over 30 Minutes Intravenous Every 6 hours 08/29/20 2021 08/29/20 2038   08/29/20 1715  ceFEPIme  (MAXIPIME) 2 g in sodium chloride 0.9 % 100 mL IVPB  Status:  Discontinued        2 g 200 mL/hr over 30 Minutes Intravenous  Once 08/29/20 1700 08/29/20 2033   08/29/20 1715  metroNIDAZOLE (FLAGYL) IVPB 500 mg        500 mg 100 mL/hr over 60 Minutes Intravenous  Once 08/29/20 1700 08/29/20 1824   08/29/20 1715  vancomycin (VANCOREADY) IVPB 1250 mg/250 mL        1,250 mg 166.7 mL/hr over 90 Minutes Intravenous  Once 08/29/20 1700 08/29/20 2028      Subjective: Today, patient was seen and examined at bedside.  Alert awake and communicative responsive.  Objective: Vitals:   09/09/20 0800 09/09/20 0900 09/09/20 1000 09/09/20 1100  BP: (!) 134/106 (!) 173/95 (!) 129/99 128/87  Pulse: 98 87 82 90  Resp: $Remo'16 16 15 15  'wbXyd$ Temp:      TempSrc:      SpO2: 100% 100% 100% 90%  Weight:      Height:        Intake/Output Summary (Last 24 hours) at 09/09/2020 1116 Last data filed at 09/09/2020 0800 Gross per 24 hour  Intake 3349.26 ml  Output 300 ml  Net 3049.26 ml   Filed Weights   09/07/20 0352 09/08/20 0600 09/09/20 0447  Weight: 53.9 kg 54.5 kg 56.5 kg    Physical examination: General:  Average built, not in obvious distress, tracheostomy in place, on a vent. HENT:   No scleral pallor or icterus noted. Oral mucosa is moist.  Tracheostomy in place Chest:  Clear breath sounds.  Diminished breath sounds bilaterally. No crackles or wheezes.  CVS: S1 &S2 heard. No murmur.  Regular rate and rhythm. Abdomen: Soft, mild discomfort on palpation, distended.  Bowel sounds are heard.  PEG tube in place. Extremities: No cyanosis, clubbing or edema.  Peripheral pulses are palpable. Psych: Alert, awake and oriented, normal mood CNS:  No cranial nerve deficits.  Power equal in all extremities.   Skin: Warm and dry.  No rashes noted.  Data Reviewed: I have personally reviewed following labs and imaging studies  CBC: Recent Labs  Lab 09/06/20 0134 09/07/20 0323 09/07/20 0549 09/07/20 1601  09/08/20 0434 09/09/20 0015  WBC 8.8 9.1  --  12.8* 8.6 10.1  HGB 8.3* 9.0* 11.6* 10.0* 9.7* 9.5*  HCT 27.1* 31.4* 34.0* 34.0* 32.4* 31.6*  MCV 86.0 90.0  --  87.4 86.6 86.3  PLT 283 333  --  424* 361 250   Basic Metabolic Panel: Recent Labs  Lab 09/03/20 0140 09/04/20 0133 09/05/20 0020 09/06/20 0134 09/07/20 0323 09/07/20 0549 09/08/20 0434 09/09/20 0015  NA 138   < > 141 140 138 139 138 136  K 3.7   < > 3.4* 3.4* 4.1 4.1 4.4 4.5  CL 101   < > 100 102 101  --  94* 94*  CO2 28   < >  34* 34* 28  --  36* 35*  GLUCOSE 73   < > 90 112* 117*  --  112* 124*  BUN 18   < > 17 24* 19  --  33* 37*  CREATININE 0.77   < > 0.83 0.77 0.65  --  1.32* 1.09*  CALCIUM 9.7   < > 10.2 10.1 9.3  --  9.9 9.6  MG 1.8  --   --   --  1.8  --  2.0 2.0  PHOS 3.4  --   --   --  3.8  --   --  2.5   < > = values in this interval not displayed.   GFR: Estimated Creatinine Clearance: 43 mL/min (A) (by C-G formula based on SCr of 1.09 mg/dL (H)). Liver Function Tests: Recent Labs  Lab 09/03/20 0140 09/04/20 0133  AST 23 22  ALT 15 12  ALKPHOS 56 63  BILITOT 0.5 0.5  PROT 6.4* 6.5  ALBUMIN 2.4* 2.4*   No results for input(s): LIPASE, AMYLASE in the last 168 hours. No results for input(s): AMMONIA in the last 168 hours. Coagulation Profile: No results for input(s): INR, PROTIME in the last 168 hours. Cardiac Enzymes: No results for input(s): CKTOTAL, CKMB, CKMBINDEX, TROPONINI in the last 168 hours. BNP (last 3 results) No results for input(s): PROBNP in the last 8760 hours. HbA1C: No results for input(s): HGBA1C in the last 72 hours. CBG: Recent Labs  Lab 09/08/20 1503 09/08/20 1912 09/08/20 2311 09/09/20 0311 09/09/20 0726  GLUCAP 143* 135* 125* 133* 125*   Lipid Profile: No results for input(s): CHOL, HDL, LDLCALC, TRIG, CHOLHDL, LDLDIRECT in the last 72 hours. Thyroid Function Tests: No results for input(s): TSH, T4TOTAL, FREET4, T3FREE, THYROIDAB in the last 72 hours. Anemia  Panel: No results for input(s): VITAMINB12, FOLATE, FERRITIN, TIBC, IRON, RETICCTPCT in the last 72 hours. Sepsis Labs: Recent Labs  Lab 09/07/20 1601  LATICACIDVEN 2.7*    No results found for this or any previous visit (from the past 240 hour(s)).  Radiology Studies: DG Abd 1 View  Result Date: 09/08/2020 CLINICAL DATA:  Abdominal distension. Evaluate bowel pattern. Ileus. EXAM: ABDOMEN - 1 VIEW COMPARISON:  Sep 04, 2020 FINDINGS: Stable PEG tube. No free air, portal venous gas, or pneumatosis. Air-filled mildly prominent loops of bowel are identified, improved in the interval. No other interval changes. IMPRESSION: Improving ileus.  Recommend attention on follow-up. Electronically Signed   By: Dorise Bullion III M.D   On: 09/08/2020 09:55   Scheduled Meds: . apixaban  5 mg Per Tube BID  . arformoterol  15 mcg Nebulization BID  . bisacodyl  10 mg Rectal Q0600  . budesonide  0.25 mg Nebulization BID  . chlorhexidine gluconate (MEDLINE KIT)  15 mL Mouth Rinse BID  . Chlorhexidine Gluconate Cloth  6 each Topical Daily  . clonazePAM  1 mg Per Tube TID  . [START ON 09/10/2020] enoxaparin (LOVENOX) injection  30 mg Subcutaneous Q24H  . feeding supplement (PROSource TF)  45 mL Per Tube BID  . levothyroxine  88 mcg Per Tube Q0600  . mouth rinse  15 mL Mouth Rinse 10 times per day  . metoCLOPramide (REGLAN) injection  10 mg Intravenous Q8H  . multivitamin with minerals  1 tablet Per Tube Daily  . pantoprazole sodium  40 mg Per Tube Daily  . polyethylene glycol  17 g Per Tube Daily  . revefenacin  175 mcg Nebulization Daily  . rosuvastatin  40  mg Per Tube Daily  . simethicone  40 mg Per Tube Q6H  . temazepam  15 mg Per Tube QHS  . venlafaxine  37.5 mg Per Tube BID WC   Continuous Infusions: . sodium chloride Stopped (08/31/20 2128)  . sodium chloride Stopped (08/31/20 1907)  . dexmedetomidine (PRECEDEX) IV infusion Stopped (09/05/20 0016)  . feeding supplement (OSMOLITE 1.5 CAL)  1,000 mL (09/09/20 0308)  . lactated ringers 100 mL/hr at 09/09/20 0800     LOS: 11 days   Flora Lipps, MD [facility] f 7PM-7AM, please contact night-coverage

## 2020-09-09 NOTE — Progress Notes (Addendum)
eLink Physician-Brief Progress Note Patient Name: Yobana Culliton DOB: [birth year] MRN: [id number]   Date of Service  09/09/2020  HPI/Events of Note  CCM had to replace the pulled out Peg with a foley to maintain patency, painful to pt, received 0.2mg  of Dilaudid at 6:13pm which is ordered q 4, was wondering if she could possibly have a one time additional of Dilaudid to get her to 10:30pm  eICU Interventions  Camera: Has trach hooked to vent. In synchrony.  Dilaudid 0.2 mg once ordered.      Intervention Category Intermediate Interventions: Pain - evaluation and management  [name] 09/09/2020, 9:25 PM   5 AM KUB: Gastrostomy catheter overlying its expected position, particularly in relation to the pre-existing T tacks. Intraluminal position of the catheter, however, cannot be determined in absence of administration of contrast.   GI to look into this in AM.

## 2020-09-09 NOTE — Progress Notes (Signed)
eLink Physician-Brief Progress Note Patient Name: Kathleen Cameron DOB: 02-May-1965 MRN: [id number]   Date of Service  09/09/2020  HPI/Events of Note  Only 20cc of UO reported yesterday, bladder scan showing 253, do you want to go ahead and I and O the pt           currently with PIV's and LR at 100  eICU Interventions  repeat scan after 2 to 4 hrs, to do I/O straight cath if over 400     Intervention Category Intermediate Interventions: Oliguria - evaluation and management  [name] 09/09/2020, 1:53 AM

## 2020-09-09 NOTE — Progress Notes (Signed)
Notifed by RN that pt pulled her PEG tube out.  Initially was partially out and area was painful if palpated. PEG is now completely out.  Pt receiving all her meds and enteral feeds through the PEG tube.  Discussed with GI as they were consulted on case but they did not place the PEG and recommended calling surgery.  Reviewing chart, the PEG was placed by [facility] in 04/2020.  Discussed with Dr. Donell Beers who recommended that a foley be put into the PEG track and inflate the balloon and flush.The sooner it is done the better she states.  Surgery will evaluate with study in am she states.  Informed her that I have never done that procedure and not comfortable performing it. Dr. Donell Beers is in a trauma and unable to go to floor she states.  PCCM is consulted on this patient and called and discussed with PCCM who will go evaluate and place foley.

## 2020-09-09 NOTE — Progress Notes (Signed)
Upon initial assessment, PEG tube was noted to be partially pulled out with tube feed leaking from around it. Over night [facility] was made aware. No new orders at this time. Will monitor PEG tube site closely.

## 2020-09-09 NOTE — Progress Notes (Signed)
NAME:  Kathleen Cameron, MRN:  [id number], DOB:  Nov 15, 1965, LOS: 11 ADMISSION DATE:  08/29/2020, CONSULTATION DATE:  08/29/2020 REFERRING MD:  Dr. Allena Katz CHIEF COMPLAINT:  SOB  History of Present Illness:  55 yo female presented to ED with dyspnea and hypoxia with SpO2 85%.  She had fever and found to have mucus plugging.  Started on antibiotics.  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  She is followed in trach clinic [name].  She has hx of respiratory arrest from mucus plugging in the setting of Corynebacterium pneumonia.  Pertinent Medical History:  Anxiety, HTN, Seizures  Significant Hospital Events:  . 5/11 Increasing SOB, presented to [facility] ED, code sepsis called, Pip-tazo>, Vanc> flagyl x1, head ct neg, CT abd pelvis grossly unremarkable, UC> , BC> . 5/12 Anxious overnight, improved with Xanax.  . 5/15 surgery consulted for abdominal pain . 5/16 GI consulted . 5/20 anxiety driven worsening respiratory mechanics; hypotension, mild AKI . 5/21 better anxiety control  Tests:   CT head 5/11 >> atrophy and small vessel ischemic changes  CT abd/pelvis 5/11 >> distended stomach  CT abd/pelvis 5/15 >> no acute findings  Interim History / Subjective:  Doing better this am. Low urine output.  Objective   Blood pressure (!) 120/96, pulse 88, temperature (!) 96.7 F (35.9 C), temperature source Axillary, resp. rate 15, height 5\' 1"  (1.549 m), weight 56.5 kg, SpO2 100 %.    Vent Mode: PRVC FiO2 (%):  [40 %] 40 % Set Rate:  [20 bmp] 20 bmp Vt Set:  [420 mL] 420 mL PEEP:  [5 cmH20] 5 cmH20 Plateau Pressure:  [25 cmH20-34 cmH20] 31 cmH20   Intake/Output Summary (Last 24 hours) at 09/09/2020 0724 Last data filed at 09/09/2020 0700 Gross per 24 hour  Intake 3545.71 ml  Output 320 ml  Net 3225.71 ml   Filed Weights   09/07/20 0352 09/08/20 0600 09/09/20 0447  Weight: 53.9 kg 54.5 kg 56.5 kg   Physical  Examination: Constitutional: no acute distress  Eyes: EOMI, pupils equal Ears, nose, mouth, and throat: trach with minimal secretions, +temporal wasting Cardiovascular: RRR, ext warm Respiratory: diminished bilaterally, able to trigger vent Gastrointestinal: protuberant with hypoactive BS, stable Skin: No rashes, normal turgor Neurologic: moves all 4 ext but weak Psychiatric: less anxious  KUB improving ileus BUN/Cr ibetter CBC stable  Resolved Hospital Problem list     Assessment & Plan:   Acute on chronic hypoxic respiratory failure from tracheobronchitis with mucus plugging- completed ABx 5/17 Chronic trach/vent status. 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 - continue synthroid, recheck TSH in a few weeks  Seizure disorder- no issues here, controlled with benzos  [place] DVT from 06/12/20. - Can change eliquis to lovenox DVT ppx starting 5/23 as she will be 3 months out  Recurrent admissions- she and husband are adamant against SNF and LTACH; unfortunately this means recurrent admissions as her baseline clinical status cannot be managed at home.  Multiple PCCM and palliative providers have discussed this with family.  Tough situation, continue discussions.  Best practice:  Diet: PO diet DVT prophylaxis: Eliquis GI prophylaxis: Protonix Mobility: as tolerated Code Status:  full code Disposition: ICU  Patient critically ill due to ileus, respiratory failure Interventions to address this today  fluids, vent titration, precedex titration Risk of deterioration without these interventions is high  I personally spent 35 minutes providing critical care not including any separately billable procedures  Myrla Halsted MD [facility]  Prefer epic messenger for cross cover needs If after hours, please call E-link

## 2020-09-09 NOTE — Progress Notes (Signed)
Called by hospitalist to help with peg tube loss.  Surgery unable to see due to emergencies.  At the recommendation of surgery, a 16 Fr foley was placed to maintain stomal patency.  Placed without difficulty.  KUB pending.

## 2020-09-10 DIAGNOSIS — K567 Ileus, unspecified: Secondary | ICD-10-CM | POA: Diagnosis not present

## 2020-09-10 DIAGNOSIS — R6521 Severe sepsis with septic shock: Secondary | ICD-10-CM | POA: Diagnosis not present

## 2020-09-10 DIAGNOSIS — J9621 Acute and chronic respiratory failure with hypoxia: Secondary | ICD-10-CM | POA: Diagnosis not present

## 2020-09-10 DIAGNOSIS — R1084 Generalized abdominal pain: Secondary | ICD-10-CM | POA: Diagnosis not present

## 2020-09-10 DIAGNOSIS — J9611 Chronic respiratory failure with hypoxia: Secondary | ICD-10-CM | POA: Diagnosis not present

## 2020-09-10 DIAGNOSIS — A419 Sepsis, unspecified organism: Secondary | ICD-10-CM | POA: Diagnosis not present

## 2020-09-10 DIAGNOSIS — E039 Hypothyroidism, unspecified: Secondary | ICD-10-CM | POA: Diagnosis not present

## 2020-09-10 LAB — CBC
HCT: 29.6 % — ABNORMAL LOW (ref 36.0–46.0)
Hemoglobin: 8.9 g/dL — ABNORMAL LOW (ref 12.0–15.0)
MCH: 26.2 pg (ref 26.0–34.0)
MCHC: 30.1 g/dL (ref 30.0–36.0)
MCV: 87.1 fL (ref 80.0–100.0)
Platelets: 254 10*3/uL (ref 150–400)
RBC: 3.4 MIL/uL — ABNORMAL LOW (ref 3.87–5.11)
RDW: 17.1 % — ABNORMAL HIGH (ref 11.5–15.5)
WBC: 10.7 10*3/uL — ABNORMAL HIGH (ref 4.0–10.5)
nRBC: 0 % (ref 0.0–0.2)

## 2020-09-10 LAB — BASIC METABOLIC PANEL
Anion gap: 7 (ref 5–15)
BUN: 28 mg/dL — ABNORMAL HIGH (ref 6–20)
CO2: 34 mmol/L — ABNORMAL HIGH (ref 22–32)
Calcium: 9.5 mg/dL (ref 8.9–10.3)
Chloride: 94 mmol/L — ABNORMAL LOW (ref 98–111)
Creatinine, Ser: 0.74 mg/dL (ref 0.44–1.00)
GFR, Estimated: >60 mL/min (ref >60)
Glucose, Bld: 82 mg/dL (ref 70–99)
Potassium: 4.9 mmol/L (ref 3.5–5.1)
Sodium: 135 mmol/L (ref 135–145)

## 2020-09-10 LAB — GLUCOSE, CAPILLARY
Glucose-Capillary: 71 mg/dL (ref 70–99)
Glucose-Capillary: 84 mg/dL (ref 70–99)
Glucose-Capillary: 43 mg/dL — CL (ref 70–99)
Glucose-Capillary: 108 mg/dL — ABNORMAL HIGH (ref 70–99)
Glucose-Capillary: 55 mg/dL — ABNORMAL LOW (ref 70–99)
Glucose-Capillary: 122 mg/dL — ABNORMAL HIGH (ref 70–99)
Glucose-Capillary: 40 mg/dL — CL (ref 70–99)
Glucose-Capillary: 106 mg/dL — ABNORMAL HIGH (ref 70–99)
Glucose-Capillary: 80 mg/dL (ref 70–99)

## 2020-09-10 LAB — MAGNESIUM: Magnesium: 1.9 mg/dL (ref 1.7–2.4)

## 2020-09-10 LAB — PHOSPHORUS: Phosphorus: 2.7 mg/dL (ref 2.5–4.6)

## 2020-09-10 MED ORDER — HYDROMORPHONE HCL 1 MG/ML IJ SOLN
0.5000 mg | Freq: Once | INTRAMUSCULAR | Status: AC
  Administered 2020-09-10: 0.5 mg via INTRAVENOUS
  Filled 2020-09-10: qty 0.5

## 2020-09-10 MED ORDER — DEXTROSE 5 % IV SOLN: INTRAVENOUS | Status: DC

## 2020-09-10 MED ORDER — DEXTROSE IN LACTATED RINGERS 5 % IV SOLN: INTRAVENOUS | Status: DC

## 2020-09-10 MED ORDER — DEXTROSE 50 % IV SOLN
INTRAVENOUS | Status: AC
  Administered 2020-09-10: 50 mL
  Filled 2020-09-10: qty 50

## 2020-09-10 MED ORDER — ENOXAPARIN SODIUM 40 MG/0.4ML IJ SOSY
40.0000 mg | PREFILLED_SYRINGE | INTRAMUSCULAR | Status: DC
  Administered 2020-09-11: 40 mg via SUBCUTANEOUS
  Filled 2020-09-10: qty 0.4

## 2020-09-10 MED ORDER — DEXTROSE 50 % IV SOLN
INTRAVENOUS | Status: AC
  Administered 2020-09-10: 100 mL
  Filled 2020-09-10: qty 50

## 2020-09-10 NOTE — Progress Notes (Signed)
SLP Cancellation Note  Patient Details Name: Sheryle Vice MRN: [id number] DOB: 05/07/1965   Cancelled treatment:       Reason Eval/Treat Not Completed: Other (comment). RN gave ok to try PO, NPO for PEG tomorrow, but Pt politely refused sips of water.    Akul Leggette, Riley Nearing 09/10/2020, 3:44 PM

## 2020-09-10 NOTE — Progress Notes (Signed)
Referring Physician(s): Dr Charlsie Quest  Supervising Physician: Ruthann Cancer  Patient Status:  Kathleen Cameron  Chief Complaint:  G tube dislodged Pulled out  Subjective:  Request made for IR to replace G tube Placed in OR 04/24/20 G tube has been dislodged "pulled out" 5/22 am Foley in place now In use  Scheduled for replacement likely 09/11/20 in IR    Allergies: Stiolto respimat [tiotropium bromide-olodaterol]  Medications: Prior to Admission medications   Medication Sig Start Date End Date Taking? Authorizing Provider  amLODipine (NORVASC) 5 MG tablet Take 1 tablet (5 mg total) by mouth daily. 08/24/20  Yes Sagardia, Ines Bloomer, MD  apixaban (ELIQUIS) 5 MG TABS tablet Place 1 tablet (5 mg total) into feeding tube 2 (two) times daily. 08/20/20 12/18/20 Yes Sagardia, Ines Bloomer, MD  arformoterol [facility]) 15 MCG/2ML NEBU Take 2 mLs (15 mcg total) by nebulization 2 (two) times daily. 07/10/20  Yes Candee Furbish, MD  bisacodyl (DULCOLAX) 10 MG suppository Place 1 suppository (10 mg total) rectally daily as needed for severe constipation. 07/10/20  Yes Sagardia, Ines Bloomer, MD  budesonide (PULMICORT) 0.25 MG/2ML nebulizer solution Take 2 mLs (0.25 mg total) by nebulization 2 (two) times daily. 07/10/20  Yes Candee Furbish, MD  chlorhexidine gluconate, MEDLINE KIT, (PERIDEX) 0.12 % solution 15 mLs by Mouth Rinse route 2 (two) times daily. 07/10/20  Yes Candee Furbish, MD  clonazePAM (KLONOPIN) 1 MG tablet Place 1/2 tablet (0.5 mg total) into feeding tube 3 (three) times daily as needed for anxiety. 08/29/20  Yes Sagardia, Ines Bloomer, MD  ipratropium-albuterol (DUONEB) 0.5-2.5 (3) MG/3ML SOLN Take 3 mLs by nebulization every 4 (four) hours. 07/22/20  Yes [provider]  levothyroxine (SYNTHROID) 50 MCG tablet Take 1 tablet (50 mcg total) by mouth daily at 6 (six) AM. 08/17/20  Yes Sagardia, Ines Bloomer, MD  lip balm (CARMEX) ointment Apply topically as needed for lip care.  03/08/20  Yes Candee Furbish, MD  metoCLOPramide (REGLAN) 5 MG/5ML solution Place 10 mLs (10 mg total) into feeding tube in the morning and at bedtime. 07/25/20  Yes Candee Furbish, MD  Multiple Vitamin (MULTIVITAMIN WITH MINERALS) TABS tablet Place 1 tablet into feeding tube daily. 07/03/20 10/01/20 Yes Hall, Lorenda Cahill, DO  Nutritional Supplements (ENSURE ACTIVE HIGH PROTEIN) LIQD Take 1 Can by mouth in the morning and at bedtime. 07/09/20  Yes Barb Merino, MD  Nutritional Supplements (FEEDING SUPPLEMENT, OSMOLITE 1.2 CAL,) LIQD Place 1,000 mLs into feeding tube continuous. 07/10/20  Yes Sagardia, Ines Bloomer, MD  oxyCODONE-acetaminophen (PERCOCET/ROXICET) 5-325 MG tablet PLACE 1 TABLET INTO FEEDING TUBE EVERY EIGHT HOURS AS NEEDED FOR UP TO 3 DAYS FOR SEVERE PAIN. Patient taking differently: Take 1 tablet by mouth every 6 (six) hours as needed for moderate pain. 07/02/20 12/29/20 Yes Hall, Carole N, DO  pantoprazole sodium (PROTONIX) 40 mg/20 mL PACK Place 20 mLs (40 mg total) into feeding tube at bedtime. 08/24/20  Yes Sagardia, Ines Bloomer, MD  phenol (CHLORASEPTIC) 1.4 % LIQD Use as directed 1 spray in the mouth or throat as needed for throat irritation / pain. 03/08/20  Yes Candee Furbish, MD  polyethylene glycol (MIRALAX / GLYCOLAX) 17 g packet Place 17 g into feeding tube daily as needed for mild constipation. 03/08/20  Yes Candee Furbish, MD  revefenacin [facility]) 175 MCG/3ML nebulizer solution Take 3 mLs (175 mcg total) by nebulization daily. 07/10/20  Yes Candee Furbish, MD  rosuvastatin (CRESTOR) 40 MG  tablet Place 1 tablet (40 mg total) into feeding tube daily. 07/10/20  Yes Sagardia, Ines Bloomer, MD  senna-docusate (SENOKOT-S) 8.6-50 MG tablet Place 2 tablets into feeding tube at bedtime as needed for moderate constipation. 03/08/20  Yes Candee Furbish, MD  sodium chloride (OCEAN) 0.65 % SOLN nasal spray Place 1 spray into both nostrils as needed for congestion. 03/08/20  Yes Candee Furbish,  MD  sorbitol 70 % SOLN Place 30 mLs into feeding tube daily as needed for moderate constipation. 07/25/20  Yes Candee Furbish, MD  temazepam (RESTORIL) 15 MG capsule Place 1 capsule (15 mg total) into feeding tube at bedtime as needed for sleep. 07/20/20  Yes Horald Pollen, MD  venlafaxine [facility]) 37.5 MG tablet Place 1 tablet (37.5 mg total) into feeding tube 2 (two) times daily with a meal. 07/20/20 10/18/20 Yes Sagardia, Ines Bloomer, MD  Chlorhexidine Gluconate Cloth 2 % PADS Apply 6 each topically daily. 08/21/20   Horald Pollen, MD     Vital Signs: BP (!) 150/82 (BP Location: Right Arm)   Pulse 61   Temp (!) 97.5 F (36.4 C) (Oral)   Resp 19   Ht _0  (1.549 m)   Wt 125 lb 3.5 oz (56.8 kg)   SpO2 100%   BMI 23.66 kg/m   Physical Exam Skin:    General: Skin is warm.     Comments: Site of G tube is clean and dry Foley in place  In use Some leakage at site      Imaging: DG Abd 1 View  Result Date: 09/09/2020 CLINICAL DATA:  Gastrostomy catheter placement EXAM: ABDOMEN - 1 VIEW COMPARISON:  09/08/2020 FINDINGS: Normal abdominal gas pattern. Endoscopic clip noted within the a right hemipelvis. Three metallic densities within the left upper quadrant are compatible with T tacks. A probable balloon retention catheter is seen within this region and, while overlying the expected position, intraluminal position of the retaining balloon cannot be determined in absence of contrast administration. No gross free intraperitoneal gas. Small bilateral pleural effusions are present. IMPRESSION: Gastrostomy catheter overlying its expected position, particularly in relation to the pre-existing T tacks. Intraluminal position of the catheter, however, cannot be determined in absence of administration of contrast. Electronically Signed   By: Fidela Salisbury MD   On: 09/09/2020 22:25   DG Abd 1 View  Result Date: 09/08/2020 CLINICAL DATA:  Abdominal distension. Evaluate bowel pattern.  Ileus. EXAM: ABDOMEN - 1 VIEW COMPARISON:  Sep 04, 2020 FINDINGS: Stable PEG tube. No free air, portal venous gas, or pneumatosis. Air-filled mildly prominent loops of bowel are identified, improved in the interval. No other interval changes. IMPRESSION: Improving ileus.  Recommend attention on follow-up. Electronically Signed   By: Dorise Bullion III M.D   On: 09/08/2020 09:55   DG Chest Port 1 View  Result Date: 09/07/2020 CLINICAL DATA:  Respiratory failure EXAM: PORTABLE CHEST 1 VIEW COMPARISON:  09/04/2020 FINDINGS: The lungs are symmetrically hyperinflated in keeping with changes of underlying COPD. Mild right basilar scarring is again noted. No superimposed confluent pulmonary infiltrate. No pneumothorax or pleural effusion. Cardiac size within normal limits. Tracheostomy unchanged. No acute bone abnormality. IMPRESSION: Stable examination. COPD. No radiographic evidence of acute cardiopulmonary disease. Electronically Signed   By: Fidela Salisbury MD   On: 09/07/2020 05:49    Labs:  CBC: Recent Labs    09/07/20 1601 09/08/20 0434 09/09/20 0015 09/10/20 0115  WBC 12.8* 8.6 10.1 10.7*  HGB 10.0* 9.7*  9.5* 8.9*  HCT 34.0* 32.4* 31.6* 29.6*  PLT 424* 361 272 254    COAGS: Recent Labs    03/23/20 0434 06/25/20 2027 07/21/20 1207 07/22/20 1907 07/23/20 0253 07/24/20 0127 08/29/20 1645  INR 1.2  --  1.6*  --   --   --  1.4*  APTT  --    < > 36 49* 125* 33 37*   < > = values in this interval not displayed.    BMP: Recent Labs    11/05/19 2101 11/05/19 2223 11/07/19 0546 11/08/19 0300 11/10/19 0412 01/28/20 1503 09/07/20 0323 09/07/20 0549 09/08/20 0434 09/09/20 0015 09/10/20 0115  NA 135   < > 135 133* 134*   < > 138 139 138 136 135  K 3.6   < > 4.7 4.5 4.4   < > 4.1 4.1 4.4 4.5 4.9  CL 86*  --  86* 86* 85*   < > 101  --  94* 94* 94*  CO2 36*  --  40* 39* 40*   < > 28  --  36* 35* 34*  GLUCOSE 117*  --  112* 118* 133*   < > 117*  --  112* 124* 82  BUN 19  --   28* 35* 31*   < > 19  --  33* 37* 28*  CALCIUM 9.4  --  9.5 9.4 9.3   < > 9.3  --  9.9 9.6 9.5  CREATININE 0.71  --  0.84 0.88 0.96   < > 0.65  --  1.32* 1.09* 0.74  GFRNONAA >60  --  >60 >60 >60   < > >60  --  47* 59* >60  GFRAA >60  --  >60 >60 >60  --   --   --   --   --   --    < > = values in this interval not displayed.    LIVER FUNCTION TESTS: Recent Labs    08/29/20 1645 09/01/20 0034 09/03/20 0140 09/04/20 0133  BILITOT 0.7 0.8 0.5 0.5  AST _0 ALT _1 ALKPHOS 84 57 56 63  PROT 8.6* 7.0 6.4* 6.5  ALBUMIN 3.1* 2.6* 2.4* 2.4*    Assessment and Plan:  G tube dislodged For replacement in IR 5/24 I have spoken to husband Berneta Sages via phone He consents to procedure He is aware of risks and benefits: infection; damage to surrounding structures Consent in IR   Electronically Signed: Lavonia Drafts, PA-C 09/10/2020, 2:16 PM   I spent a total of 15 Minutes at the the patient's bedside AND on the patient's hospital floor or unit, greater than 50% of which was counseling/coordinating care for perc G tube replacement

## 2020-09-10 NOTE — Progress Notes (Signed)
eLink Physician-Brief Progress Note Patient Name: Kathleen Cameron DOB: May 09, 1965 MRN: [id number]   Date of Service  09/10/2020  HPI/Events of Note  BG 80's. NPO and GT is out.  eICU Interventions  D5 water at 50 ml/hr, CBG q4 hrly. GI to see in AM.      Intervention Category Intermediate Interventions: Other:  [name] 09/10/2020, 5:23 AM

## 2020-09-10 NOTE — Progress Notes (Signed)
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  Apparently had an episode of desaturation at 85% on home vent.  She has had multiple recent hospitalizations for mucous plugging events resulting in respiratory arrest, followed by pulmonology as an outpatient in the trach clinic.  Recent cuffed 6.0 trach replacement 4/27.  In the ED, she was febrile 38.1 WBC 23.7, concern for sepsis and started on antibiotics. Hospital course was complicated by persistent ileus. GI / PCCM following.  Assessment & Plan:   Principal Problem:   Severe sepsis with septic shock (CODE) (HCC) Active Problems:   Chronic respiratory failure with hypoxia (HCC)   Hypothyroidism (acquired)   Generalized abdominal pain   Seizure disorder (HCC)   Intractable nausea and vomiting  Chronic respiratory failure with tracheostomy, chronic mechanical ventilation: Severe sepsis, likely pulmonary origin: Patient completed 7-day course of IV Zosyn.  Blood cultures suggestive for bottles of staph bodies likely contaminant.  Urine culture with Pseudomonas 50,000 colony likely colonizer.  Ileus :   Persistent for several days.  G-tube on intermittent suction.  CT scan without acute findings.   Continue to try slow interal feeding.  Still distended.   Acute urinary retention: Patient required several in and out caths and finally foley was placed on 09/02/2020.  Currently on external urinary catheter.  COPD with recurrent mucous plugging: Vent dependent.  Likely to have recurrent admission to the hospital.  PCCM on board for vent management.   Hypothyroidism: Currently on Synthroid 88 mcg daily.  Plan to repeat in 3 weeks.    Generalized abdominal pain   Due to ileus.  Complains of mild discomfort.  History of seizure disorder: Not on antiepileptic medications.  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  DVT prophylaxis:  Eliquis  Code Status:  Full code  Family Communication: None today.  I spoke with the patient's husband at bedside yesterday.  Status is: Inpatient  Remains inpatient appropriate because:Inpatient level of care appropriate due to severity of illness   Dispo: The patient is from: Home              Anticipated d/c is to: Home              Patient currently is not medically stable to d/c.   Difficult to place patient No  Consultants:   PCCM  Procedures  Antimicrobials: None currently  Anti-infectives (From admission, onward)   Start     Dose/Rate Route Frequency Ordered Stop   08/30/20 1830  vancomycin (VANCOREADY) IVPB 1000 mg/200 mL  Status:  Discontinued        1,000 mg 200 mL/hr over 60 Minutes Intravenous Every 24 hours 08/30/20 0847 08/30/20 1038   08/30/20 1830  vancomycin (VANCOREADY) IVPB 750 mg/150 mL  Status:  Discontinued        750 mg 150 mL/hr over 60 Minutes Intravenous Every 24 hours 08/30/20 1038 09/01/20 1322   08/30/20 0600  vancomycin (VANCOREADY) IVPB 750 mg/150 mL  Status:  Discontinued        750 mg 150 mL/hr over 60 Minutes Intravenous Every 12 hours 08/29/20 1822 08/30/20 0847   08/30/20 0400  ceFEPIme (MAXIPIME) 2 g in sodium chloride 0.9 %  100 mL IVPB  Status:  Discontinued        2 g 200 mL/hr over 30 Minutes Intravenous Every 8 hours 08/29/20 1822 08/29/20 2038   08/29/20 2200  piperacillin-tazobactam (ZOSYN) IVPB 3.375 g        3.375 g 12.5 mL/hr over 240 Minutes Intravenous Every 8 hours 08/29/20 2038 09/05/20 0140   08/29/20 2030  piperacillin-tazobactam (ZOSYN) IVPB 3.375 g  Status:  Discontinued        3.375 g 100 mL/hr over 30 Minutes Intravenous Every 6 hours 08/29/20  2021 08/29/20 2038   08/29/20 1715  ceFEPIme (MAXIPIME) 2 g in sodium chloride 0.9 % 100 mL IVPB  Status:  Discontinued        2 g 200 mL/hr over 30 Minutes Intravenous  Once 08/29/20 1700 08/29/20 2033   08/29/20 1715  metroNIDAZOLE (FLAGYL) IVPB 500 mg        500 mg 100 mL/hr over 60 Minutes Intravenous  Once 08/29/20 1700 08/29/20 1824   08/29/20 1715  vancomycin (VANCOREADY) IVPB 1250 mg/250 mL        1,250 mg 166.7 mL/hr over 90 Minutes Intravenous  Once 08/29/20 1700 08/29/20 2028      Subjective: Today, patient was seen and examined at bedside.  Awake answering few questions.  Denies any nausea vomiting has mild abdominal discomfort.  Breathing okay.  Objective: Vitals:   09/10/20 1125 09/10/20 1129 09/10/20 1200 09/10/20 1515  BP:  122/82 (!) 150/82   Pulse:  61 61   Resp:  20 19   Temp: (!) 97.5 F (36.4 C)   (!) 97.5 F (36.4 C)  TempSrc: Oral   Oral  SpO2:  100% 100%   Weight:      Height:        Intake/Output Summary (Last 24 hours) at 09/10/2020 1538 Last data filed at 09/10/2020 1500 Gross per 24 hour  Intake 2203.28 ml  Output 510 ml  Net 1693.28 ml   Filed Weights   09/08/20 0600 09/09/20 0447 09/10/20 0436  Weight: 54.5 kg 56.5 kg 56.8 kg    Physical examination: General:  Average built, not in obvious distress tracheostomy in place, on a ventilator HENT:   No scleral pallor or icterus noted. Oral mucosa is moist.  Chest:  Clear breath sounds.  Diminished breath sounds bilaterally. No crackles or wheezes.  CVS: S1 &S2 heard. No murmur.  Regular rate and rhythm. Abdomen: Soft, mild abdominal discomfort on palpation, mildly distended.  PEG tube in place. Extremities: No cyanosis, clubbing or edema.  Peripheral pulses are palpable. Psych: Alert, awake and oriented, normal mood CNS:  No cranial nerve deficits.  Power equal in all extremities.   Skin: Warm and dry.  No rashes noted.   Data Reviewed: I have personally reviewed following labs and imaging  studies  CBC: Recent Labs  Lab 09/07/20 0323 09/07/20 0549 09/07/20 1601 09/08/20 0434 09/09/20 0015 09/10/20 0115  WBC 9.1  --  12.8* 8.6 10.1 10.7*  HGB 9.0* 11.6* 10.0* 9.7* 9.5* 8.9*  HCT 31.4* 34.0* 34.0* 32.4* 31.6* 29.6*  MCV 90.0  --  87.4 86.6 86.3 87.1  PLT 333  --  424* 361 272 833   Basic Metabolic Panel: Recent Labs  Lab 09/06/20 0134 09/07/20 0323 09/07/20 0549 09/08/20 0434 09/09/20 0015 09/10/20 0115  NA 140 138 139 138 136 135  K 3.4* 4.1 4.1 4.4 4.5 4.9  CL 102 101  --  94* 94* 94*  CO2 34* 28  --  36* 35* 34*  GLUCOSE 112* 117*  --  112* 124* 82  BUN 24* 19  --  33* 37* 28*  CREATININE 0.77 0.65  --  1.32* 1.09* 0.74  CALCIUM 10.1 9.3  --  9.9 9.6 9.5  MG  --  1.8  --  2.0 2.0 1.9  PHOS  --  3.8  --   --  2.5 2.7   GFR: Estimated Creatinine Clearance: 58.5 mL/min (by C-G formula based on SCr of 0.74 mg/dL). Liver Function Tests: Recent Labs  Lab 09/04/20 0133  AST 22  ALT 12  ALKPHOS 63  BILITOT 0.5  PROT 6.5  ALBUMIN 2.4*   No results for input(s): LIPASE, AMYLASE in the last 168 hours. No results for input(s): AMMONIA in the last 168 hours. Coagulation Profile: No results for input(s): INR, PROTIME in the last 168 hours. Cardiac Enzymes: No results for input(s): CKTOTAL, CKMB, CKMBINDEX, TROPONINI in the last 168 hours. BNP (last 3 results) No results for input(s): PROBNP in the last 8760 hours. HbA1C: No results for input(s): HGBA1C in the last 72 hours. CBG: Recent Labs  Lab 09/10/20 0435 09/10/20 0729 09/10/20 1124 09/10/20 1146 09/10/20 1512  GLUCAP 71 84 43* 108* 55*   Lipid Profile: No results for input(s): CHOL, HDL, LDLCALC, TRIG, CHOLHDL, LDLDIRECT in the last 72 hours. Thyroid Function Tests: No results for input(s): TSH, T4TOTAL, FREET4, T3FREE, THYROIDAB in the last 72 hours. Anemia Panel: No results for input(s): VITAMINB12, FOLATE, FERRITIN, TIBC, IRON, RETICCTPCT in the last 72 hours. Sepsis Labs: Recent  Labs  Lab 09/07/20 1601  LATICACIDVEN 2.7*    No results found for this or any previous visit (from the past 240 hour(s)).  Radiology Studies: DG Abd 1 View  Result Date: 09/09/2020 CLINICAL DATA:  Gastrostomy catheter placement EXAM: ABDOMEN - 1 VIEW COMPARISON:  09/08/2020 FINDINGS: Normal abdominal gas pattern. Endoscopic clip noted within the a right hemipelvis. Three metallic densities within the left upper quadrant are compatible with T tacks. A probable balloon retention catheter is seen within this region and, while overlying the expected position, intraluminal position of the retaining balloon cannot be determined in absence of contrast administration. No gross free intraperitoneal gas. Small bilateral pleural effusions are present. IMPRESSION: Gastrostomy catheter overlying its expected position, particularly in relation to the pre-existing T tacks. Intraluminal position of the catheter, however, cannot be determined in absence of administration of contrast. Electronically Signed   By: Fidela Salisbury MD   On: 09/09/2020 22:25   Scheduled Meds: . arformoterol  15 mcg Nebulization BID  . bisacodyl  10 mg Rectal Q0600  . budesonide  0.25 mg Nebulization BID  . chlorhexidine gluconate (MEDLINE KIT)  15 mL Mouth Rinse BID  . Chlorhexidine Gluconate Cloth  6 each Topical Daily  . clonazePAM  1 mg Per Tube TID  . [START ON 09/11/2020] enoxaparin (LOVENOX) injection  40 mg Subcutaneous Q24H  . feeding supplement (PROSource TF)  45 mL Per Tube BID  . levothyroxine  88 mcg Per Tube Q0600  . mouth rinse  15 mL Mouth Rinse 10 times per day  . metoCLOPramide (REGLAN) injection  10 mg Intravenous Q8H  . multivitamin with minerals  1 tablet Per Tube Daily  . pantoprazole sodium  40 mg Per Tube Daily  . polyethylene glycol  17 g Per Tube Daily  . revefenacin  175 mcg Nebulization Daily  . rosuvastatin  40 mg Per Tube Daily  . simethicone  40 mg Per Tube  Q6H  . temazepam  15 mg Per Tube QHS  .  venlafaxine  37.5 mg Per Tube BID WC   Continuous Infusions: . sodium chloride Stopped (08/31/20 2128)  . sodium chloride Stopped (08/31/20 1907)  . dexmedetomidine (PRECEDEX) IV infusion Stopped (09/05/20 0016)  . dextrose 5% lactated ringers 50 mL/hr at 09/10/20 1254  . feeding supplement (OSMOLITE 1.5 CAL) Stopped (09/09/20 1920)     LOS: 12 days   Flora Lipps, MD [facility] f 7PM-7AM, please contact night-coverage

## 2020-09-10 NOTE — Progress Notes (Signed)
NAME:  Rekia Kujala, MRN:  [id number], DOB:  Oct 14, 1965, LOS: 12 ADMISSION DATE:  08/29/2020, CONSULTATION DATE:  08/29/2020 REFERRING MD:  Dr. Allena Katz CHIEF COMPLAINT:  SOB  History of Present Illness:  55 yo female presented to ED with dyspnea and hypoxia with SpO2 85%.  She had fever and found to have mucus plugging.  Started on antibiotics.  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  She is followed in trach clinic [name].  She has hx of respiratory arrest from mucus plugging in the setting of Corynebacterium pneumonia.  Pertinent Medical History:  Anxiety, HTN, Seizures  Significant Hospital Events:  . 5/11 Increasing SOB, presented to [facility] ED, code sepsis called, Pip-tazo>, Vanc> flagyl x1, head ct neg, CT abd pelvis grossly unremarkable, UC> , BC> . 5/12 Anxious overnight, improved with Xanax.  . 5/15 surgery consulted for abdominal pain . 5/16 GI consulted . 5/20 anxiety driven worsening respiratory mechanics; hypotension, mild AKI . 5/21 better anxiety control . 5/23 PEG fell out  Tests:   CT head 5/11 >> atrophy and small vessel ischemic changes  CT abd/pelvis 5/11 >> distended stomach  CT abd/pelvis 5/15 >> no acute findings  Interim History / Subjective:  PEG fell out She looks and feels the best I have seen her.  Objective   Blood pressure (!) 122/97, pulse 72, temperature 97.6 F (36.4 C), temperature source Oral, resp. rate 18, height 5\' 1"  (1.549 m), weight 56.8 kg, SpO2 100 %.    Vent Mode: PRVC FiO2 (%):  [40 %] 40 % Set Rate:  [20 bmp] 20 bmp Vt Set:  [420 mL] 420 mL PEEP:  [5 cmH20] 5 cmH20 Plateau Pressure:  [28 cmH20-34 cmH20] 32 cmH20   Intake/Output Summary (Last 24 hours) at 09/10/2020 0834 Last data filed at 09/10/2020 0600 Gross per 24 hour  Intake 2343.47 ml  Output 250 ml  Net 2093.47 ml   Filed Weights   09/08/20 0600 09/09/20 0447 09/10/20 0436  Weight: 54.5 kg 56.5 kg 56.8 kg    Physical Examination: Constitutional: no acute distress laying in bed  Eyes: EOMI, pupils equal, exopthalmos Ears, nose, mouth, and throat: trach in place, minimal secretions Cardiovascular: RRR, ext warm Respiratory: diminished bilaterally, less accessory muscle use Gastrointestinal: soft, hypoactive bS Skin: No rashes, normal turgor Neurologic: moves all 4 ext to command Psychiatric: RASS 0   Resolved Hospital Problem list     Assessment & Plan:   Acute on chronic hypoxic respiratory failure from tracheobronchitis with mucus plugging- completed ABx 5/17 Chronic trach/vent status. 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 - continue synthroid, recheck TSH in a few weeks  Seizure disorder- no issues here, controlled with benzos  Lt [place] DVT from 06/12/20. - Can change eliquis to lovenox DVT ppx starting today as she will be 3 months out  Recurrent admissions- she and husband are adamant against SNF and LTACH; unfortunately this means recurrent admissions as her baseline clinical status cannot be managed at home.  Multiple PCCM and palliative providers have discussed this with family.  Tough situation, continue discussions.  Dislodged PEG- discussed with  surgery NP who recommended IR consult, placed  Best practice:  Diet: PO diet DVT prophylaxis: lovenox GI prophylaxis: Protonix Mobility: as tolerated Code Status:  full code Disposition: probably can go home once PEG fixed  Myrla Halsted MD PCCM

## 2020-09-11 DIAGNOSIS — R1084 Generalized abdominal pain: Secondary | ICD-10-CM | POA: Diagnosis not present

## 2020-09-11 DIAGNOSIS — J9611 Chronic respiratory failure with hypoxia: Secondary | ICD-10-CM | POA: Diagnosis not present

## 2020-09-11 DIAGNOSIS — R6521 Severe sepsis with septic shock: Secondary | ICD-10-CM | POA: Diagnosis not present

## 2020-09-11 DIAGNOSIS — E039 Hypothyroidism, unspecified: Secondary | ICD-10-CM | POA: Diagnosis not present

## 2020-09-11 LAB — GLUCOSE, CAPILLARY
Glucose-Capillary: 81 mg/dL (ref 70–99)
Glucose-Capillary: 61 mg/dL — ABNORMAL LOW (ref 70–99)
Glucose-Capillary: 91 mg/dL (ref 70–99)
Glucose-Capillary: 75 mg/dL (ref 70–99)
Glucose-Capillary: 83 mg/dL (ref 70–99)
Glucose-Capillary: 64 mg/dL — ABNORMAL LOW (ref 70–99)
Glucose-Capillary: 76 mg/dL (ref 70–99)
Glucose-Capillary: 95 mg/dL (ref 70–99)

## 2020-09-11 LAB — BASIC METABOLIC PANEL
Anion gap: 9 (ref 5–15)
BUN: 17 mg/dL (ref 6–20)
CO2: 32 mmol/L (ref 22–32)
Calcium: 9.7 mg/dL (ref 8.9–10.3)
Chloride: 97 mmol/L — ABNORMAL LOW (ref 98–111)
Creatinine, Ser: 0.69 mg/dL (ref 0.44–1.00)
GFR, Estimated: >60 mL/min (ref >60)
Glucose, Bld: 81 mg/dL (ref 70–99)
Potassium: 4.3 mmol/L (ref 3.5–5.1)
Sodium: 138 mmol/L (ref 135–145)

## 2020-09-11 LAB — PHOSPHORUS: Phosphorus: 3.3 mg/dL (ref 2.5–4.6)

## 2020-09-11 LAB — CBC
HCT: 28.6 % — ABNORMAL LOW (ref 36.0–46.0)
Hemoglobin: 8.8 g/dL — ABNORMAL LOW (ref 12.0–15.0)
MCH: 26.3 pg (ref 26.0–34.0)
MCHC: 30.8 g/dL (ref 30.0–36.0)
MCV: 85.4 fL (ref 80.0–100.0)
Platelets: 184 10*3/uL (ref 150–400)
RBC: 3.35 MIL/uL — ABNORMAL LOW (ref 3.87–5.11)
RDW: 17.1 % — ABNORMAL HIGH (ref 11.5–15.5)
WBC: 7.4 10*3/uL (ref 4.0–10.5)
nRBC: 0 % (ref 0.0–0.2)

## 2020-09-11 LAB — MAGNESIUM: Magnesium: 1.9 mg/dL (ref 1.7–2.4)

## 2020-09-11 MED ORDER — DEXTROSE 50 % IV SOLN
INTRAVENOUS | Status: AC
  Administered 2020-09-11: 12.5 g via INTRAVENOUS
  Filled 2020-09-11: qty 50

## 2020-09-11 MED ORDER — DEXTROSE 50 % IV SOLN: 12.5000 g | INTRAVENOUS | Status: AC

## 2020-09-11 MED ORDER — DEXTROSE 10 % IV SOLN: INTRAVENOUS | Status: DC

## 2020-09-11 NOTE — Progress Notes (Signed)
IR.  Unable to accommodate procedure today due to multiple emergent cases in IR- plan for possible image-guided gastrostomy tube placement in IR tentatively for tomorrow pending IR scheduling. Patient will be NPO at midnight.  Please call IR with questions/concerns.   Waylan Boga Meggen Spaziani, PA-C 09/11/2020, 3:11 PM

## 2020-09-11 NOTE — Progress Notes (Signed)
Hypoglycemic Event  CBG: 61  Treatment: D5 IV  Symptoms: none  Follow-up CBG: Time:0830 CBG Result:91  Possible Reasons for Event: pt NPO  Comments/MD notified: Hypoglycemia protocol    Chelci [place], [place]

## 2020-09-11 NOTE — Progress Notes (Addendum)
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  Apparently had an episode of desaturation at 85% on home vent.  She has had multiple recent hospitalizations for mucous plugging events resulting in respiratory arrest, followed by pulmonology as an outpatient in the trach clinic.  Recent cuffed 6.0 trach replacement 4/27.  In the ED, she was febrile 38.1 WBC 23.7, concern for sepsis and started on antibiotics. Hospital course was complicated by persistent ileus.  PCCM following for chronic vent management.  Patient had dislodged PEG tube and currently has a Foley catheter to keep it patent.  Plan is to IR guided G-tube replacement 09/11/2020.  Patient is a difficult disposition due to being on vent in family not willing for skilled nursing facility and LTAC.  Assessment & Plan:   Principal Problem:   Severe sepsis with septic shock (CODE) (HCC) Active Problems:   Chronic respiratory failure with hypoxia (HCC)   Hypothyroidism (acquired)   Generalized abdominal pain   Seizure disorder (HCC)   Intractable nausea and vomiting  Chronic respiratory failure with tracheostomy, chronic mechanical ventilation: Severe sepsis, likely pulmonary origin: Patient completed 7-day course of IV Zosyn.  Blood cultures suggestive for bottles of staph bodies likely contaminant.  Urine culture with Pseudomonas 50,000 colony likely colonizer.  No further signs of infection.  Ileus :   Patient has dislodged G-tube which was replaced by Foley catheter.  Plan for replacement today by IR..  CT scan without acute findings.   Continue to try slow interal feeding when G-tube is replaced.  Still distended.   Acute urinary retention: Patient required  several in and out caths and finally foley was placed on 09/02/2020.  Currently on external urinary catheter.  COPD with recurrent mucous plugging: Vent dependent.  Likely to have recurrent admission to the hospital.  PCCM on board for vent management.   Hypothyroidism: Currently on Synthroid 88 mcg daily.  Plan to repeat in 3 weeks.    Generalized abdominal pain  Due to ileus.  Complains of mild discomfort.  History of seizure disorder: Not on antiepileptic medications.  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  DVT prophylaxis:  Lovenox  Code Status:  Full code  Family Communication:  I again spoke with the patient's husband at bedside today.  Status is: Inpatient  Remains inpatient appropriate because:Inpatient level of care appropriate due to severity of illness vent dependent   Dispo: The patient is from: Home              Anticipated d/c is to: Undetermined at this time.  Husband is against LTAC and skilled nursing facility.  Likely to be a prolonged course.              Patient currently is not medically stable to d/c.   Difficult to place patient yes  Consultants:   PCCM  Procedures  Antimicrobials: None currently  Subjective: Today, patient was seen and examined at bedside.  Answering questions.  Denies any nausea, vomiting or pain.  Awaiting for G-tube replacement.  Denies overt shortness of breath.  Tolerating vent.  Objective: Vitals:   09/11/20 0600 09/11/20 0734 09/11/20 0747 09/11/20 0749  BP: 102/78     Pulse:  62     Resp: 19     Temp:  (!) 97.5 F (36.4 C)    TempSrc:  Oral    SpO2: 99%  100% 100%  Weight:      Height:        Intake/Output Summary (Last 24 hours) at 09/11/2020 1034 Last data filed at 09/11/2020 0600 Gross per 24 hour  Intake 1186.31 ml  Output 1000 ml  Net 186.31 ml   Filed Weights   09/09/20 0447 09/10/20 0436  09/11/20 0500  Weight: 56.5 kg 56.8 kg 56.7 kg    Physical examination:  General:  Average built, not in obvious distress, tracheostomy in place on ventilator. HENT:   No scleral pallor or icterus noted. Oral mucosa is moist.  Chest:    Diminished breath sounds bilaterally. No crackles or wheezes.  CVS: S1 &S2 heard. No murmur.  Regular rate and rhythm. Abdomen: Soft, mild abdominal discomfort on palpation, mildly distended,, Foley catheter in place at the PEG tube site.  Bowel sounds are heard.   Extremities: No cyanosis, clubbing or edema.  Peripheral pulses are palpable. Psych: Alert, awake and oriented, normal mood CNS:  No cranial nerve deficits.  Moving all extremities. Skin: Warm and dry.  No rashes noted.   Data Reviewed: I have personally reviewed following labs and imaging studies  CBC: Recent Labs  Lab 09/07/20 1601 09/08/20 0434 09/09/20 0015 09/10/20 0115 09/11/20 0333  WBC 12.8* 8.6 10.1 10.7* 7.4  HGB 10.0* 9.7* 9.5* 8.9* 8.8*  HCT 34.0* 32.4* 31.6* 29.6* 28.6*  MCV 87.4 86.6 86.3 87.1 85.4  PLT 424* 361 272 254 562   Basic Metabolic Panel: Recent Labs  Lab 09/07/20 0323 09/07/20 0549 09/08/20 0434 09/09/20 0015 09/10/20 0115 09/11/20 0333  NA 138 139 138 136 135 138  K 4.1 4.1 4.4 4.5 4.9 4.3  CL 101  --  94* 94* 94* 97*  CO2 28  --  36* 35* 34* 32  GLUCOSE 117*  --  112* 124* 82 81  BUN 19  --  33* 37* 28* 17  CREATININE 0.65  --  1.32* 1.09* 0.74 0.69  CALCIUM 9.3  --  9.9 9.6 9.5 9.7  MG 1.8  --  2.0 2.0 1.9 1.9  PHOS 3.8  --   --  2.5 2.7 3.3   GFR: Estimated Creatinine Clearance: 58.5 mL/min (by C-G formula based on SCr of 0.69 mg/dL). Liver Function Tests: No results for input(s): AST, ALT, ALKPHOS, BILITOT, PROT, ALBUMIN in the last 168 hours. No results for input(s): LIPASE, AMYLASE in the last 168 hours. No results for input(s): AMMONIA in the last 168 hours. Coagulation Profile: No results for input(s): INR, PROTIME in the last 168  hours. Cardiac Enzymes: No results for input(s): CKTOTAL, CKMB, CKMBINDEX, TROPONINI in the last 168 hours. BNP (last 3 results) No results for input(s): PROBNP in the last 8760 hours. HbA1C: No results for input(s): HGBA1C in the last 72 hours. CBG: Recent Labs  Lab 09/10/20 1910 09/10/20 2314 09/11/20 0319 09/11/20 0732 09/11/20 0830  GLUCAP 106* 80 81 61* 91   Lipid Profile: No results for input(s): CHOL, HDL, LDLCALC, TRIG, CHOLHDL, LDLDIRECT in the last 72 hours. Thyroid Function Tests: No results for input(s): TSH, T4TOTAL, FREET4, T3FREE, THYROIDAB in the last 72 hours. Anemia Panel: No results for input(s): VITAMINB12, FOLATE, FERRITIN, TIBC, IRON, RETICCTPCT in the last 72 hours. Sepsis Labs: Recent Labs  Lab 09/07/20 1601  LATICACIDVEN 2.7*    No results  found for this or any previous visit (from the past 240 hour(s)).  Radiology Studies: DG Abd 1 View  Result Date: 09/09/2020 CLINICAL DATA:  Gastrostomy catheter placement EXAM: ABDOMEN - 1 VIEW COMPARISON:  09/08/2020 FINDINGS: Normal abdominal gas pattern. Endoscopic clip noted within the a right hemipelvis. Three metallic densities within the left upper quadrant are compatible with T tacks. A probable balloon retention catheter is seen within this region and, while overlying the expected position, intraluminal position of the retaining balloon cannot be determined in absence of contrast administration. No gross free intraperitoneal gas. Small bilateral pleural effusions are present. IMPRESSION: Gastrostomy catheter overlying its expected position, particularly in relation to the pre-existing T tacks. Intraluminal position of the catheter, however, cannot be determined in absence of administration of contrast. Electronically Signed   By: Ashesh  Parikh MD   On: 09/09/2020 22:25   Scheduled Meds: . arformoterol  15 mcg Nebulization BID  . bisacodyl  10 mg Rectal Q0600  . budesonide  0.25 mg Nebulization BID  .  chlorhexidine gluconate (MEDLINE KIT)  15 mL Mouth Rinse BID  . Chlorhexidine Gluconate Cloth  6 each Topical Daily  . clonazePAM  1 mg Per Tube TID  . enoxaparin (LOVENOX) injection  40 mg Subcutaneous Q24H  . feeding supplement (PROSource TF)  45 mL Per Tube BID  . levothyroxine  88 mcg Per Tube Q0600  . mouth rinse  15 mL Mouth Rinse 10 times per day  . metoCLOPramide (REGLAN) injection  10 mg Intravenous Q8H  . multivitamin with minerals  1 tablet Per Tube Daily  . pantoprazole sodium  40 mg Per Tube Daily  . polyethylene glycol  17 g Per Tube Daily  . revefenacin  175 mcg Nebulization Daily  . rosuvastatin  40 mg Per Tube Daily  . simethicone  40 mg Per Tube Q6H  . temazepam  15 mg Per Tube QHS  . venlafaxine  37.5 mg Per Tube BID WC   Continuous Infusions: . sodium chloride Stopped (08/31/20 2128)  . sodium chloride Stopped (08/31/20 1907)  . dexmedetomidine (PRECEDEX) IV infusion Stopped (09/05/20 0016)  . dextrose 5% lactated ringers 50 mL/hr at 09/11/20 0910  . feeding supplement (OSMOLITE 1.5 CAL) Stopped (09/09/20 1920)     LOS: 13 days    Laxman Pokhrel, MD [facility] Between 7PM-7AM, please contact night-coverage 

## 2020-09-11 NOTE — Telephone Encounter (Signed)
Generated DME for dressing pads.. printed, and faxed to [facility].Marland KitchenRaechel Chute

## 2020-09-11 NOTE — Addendum Note (Signed)
Addended by: Deatra James on: 09/11/2020 02:24 PM   Modules accepted: Orders

## 2020-09-11 NOTE — Progress Notes (Signed)
Pt had low CBG on check by NT, RN recheck CBG 76. Pt was recently started on D10, has likely not had enough time to raise CBG. Will recheck at 2200.

## 2020-09-12 ENCOUNTER — Inpatient Hospital Stay (HOSPITAL_COMMUNITY): Payer: Medicaid Other

## 2020-09-12 DIAGNOSIS — R1312 Dysphagia, oropharyngeal phase: Secondary | ICD-10-CM

## 2020-09-12 DIAGNOSIS — J9621 Acute and chronic respiratory failure with hypoxia: Secondary | ICD-10-CM | POA: Diagnosis not present

## 2020-09-12 DIAGNOSIS — A419 Sepsis, unspecified organism: Secondary | ICD-10-CM | POA: Diagnosis not present

## 2020-09-12 DIAGNOSIS — E440 Moderate protein-calorie malnutrition: Secondary | ICD-10-CM | POA: Diagnosis not present

## 2020-09-12 DIAGNOSIS — D509 Iron deficiency anemia, unspecified: Secondary | ICD-10-CM | POA: Diagnosis not present

## 2020-09-12 HISTORY — PX: IR REPLC GASTRO/COLONIC TUBE PERCUT W/FLUORO: IMG2333

## 2020-09-12 LAB — CBC
HCT: 24.1 % — ABNORMAL LOW (ref 36.0–46.0)
Hemoglobin: 7.3 g/dL — ABNORMAL LOW (ref 12.0–15.0)
MCH: 25.9 pg — ABNORMAL LOW (ref 26.0–34.0)
MCHC: 30.3 g/dL (ref 30.0–36.0)
MCV: 85.5 fL (ref 80.0–100.0)
Platelets: 260 10*3/uL (ref 150–400)
RBC: 2.82 MIL/uL — ABNORMAL LOW (ref 3.87–5.11)
RDW: 17.1 % — ABNORMAL HIGH (ref 11.5–15.5)
WBC: 7 10*3/uL (ref 4.0–10.5)
nRBC: 0 % (ref 0.0–0.2)

## 2020-09-12 LAB — BASIC METABOLIC PANEL
Anion gap: 7 (ref 5–15)
BUN: 13 mg/dL (ref 6–20)
CO2: 33 mmol/L — ABNORMAL HIGH (ref 22–32)
Calcium: 9.7 mg/dL (ref 8.9–10.3)
Chloride: 96 mmol/L — ABNORMAL LOW (ref 98–111)
Creatinine, Ser: 0.71 mg/dL (ref 0.44–1.00)
GFR, Estimated: >60 mL/min (ref >60)
Glucose, Bld: 105 mg/dL — ABNORMAL HIGH (ref 70–99)
Potassium: 3.7 mmol/L (ref 3.5–5.1)
Sodium: 136 mmol/L (ref 135–145)

## 2020-09-12 LAB — HEMOGLOBIN AND HEMATOCRIT, BLOOD
HCT: 27.3 % — ABNORMAL LOW (ref 36.0–46.0)
Hemoglobin: 8.3 g/dL — ABNORMAL LOW (ref 12.0–15.0)

## 2020-09-12 LAB — IRON AND TIBC
Iron: 83 ug/dL (ref 28–170)
Saturation Ratios: 23 % (ref 10.4–31.8)
TIBC: 365 ug/dL (ref 250–450)
UIBC: 282 ug/dL

## 2020-09-12 LAB — FOLATE: Folate: 36.2 ng/mL (ref >5.9)

## 2020-09-12 LAB — GLUCOSE, CAPILLARY
Glucose-Capillary: 98 mg/dL (ref 70–99)
Glucose-Capillary: 78 mg/dL (ref 70–99)
Glucose-Capillary: 89 mg/dL (ref 70–99)
Glucose-Capillary: 65 mg/dL — ABNORMAL LOW (ref 70–99)
Glucose-Capillary: 102 mg/dL — ABNORMAL HIGH (ref 70–99)
Glucose-Capillary: 100 mg/dL — ABNORMAL HIGH (ref 70–99)

## 2020-09-12 LAB — FERRITIN: Ferritin: 129 ng/mL (ref 11–307)

## 2020-09-12 LAB — VITAMIN B12: Vitamin B-12: 1490 pg/mL — ABNORMAL HIGH (ref 180–914)

## 2020-09-12 LAB — RETICULOCYTES
Immature Retic Fract: 23.1 % — ABNORMAL HIGH (ref 2.3–15.9)
RBC.: 3.22 MIL/uL — ABNORMAL LOW (ref 3.87–5.11)
Retic Count, Absolute: 85.7 10*3/uL (ref 19.0–186.0)
Retic Ct Pct: 2.7 % (ref 0.4–3.1)

## 2020-09-12 LAB — MAGNESIUM: Magnesium: 1.9 mg/dL (ref 1.7–2.4)

## 2020-09-12 MED ORDER — IOHEXOL 300 MG/ML  SOLN
50.0000 mL | Freq: Once | INTRAMUSCULAR | Status: AC | PRN
  Administered 2020-09-12: 15 mL

## 2020-09-12 MED ORDER — LIDOCAINE VISCOUS HCL 2 % MT SOLN
OROMUCOSAL | Status: AC
  Filled 2020-09-12: qty 15

## 2020-09-12 MED ORDER — POLYETHYLENE GLYCOL 3350 17 G PO PACK
17.0000 g | PACK | Freq: Two times a day (BID) | ORAL | Status: DC
  Administered 2020-09-12 – 2020-09-13 (×2): 17 g
  Filled 2020-09-12 (×2): qty 1

## 2020-09-12 MED ORDER — CLONAZEPAM 0.5 MG PO TABS
0.5000 mg | ORAL_TABLET | Freq: Three times a day (TID) | ORAL | Status: DC
  Administered 2020-09-12 – 2020-09-18 (×17): 0.5 mg
  Filled 2020-09-12 (×17): qty 1

## 2020-09-12 NOTE — Progress Notes (Signed)
NAME:  Kathleen Cameron, MRN:  [id number], DOB:  Jul 04, 1965, LOS: 14 ADMISSION DATE:  08/29/2020, CONSULTATION DATE:  08/29/2020 REFERRING MD:  Dr. Allena Katz CHIEF COMPLAINT:  SOB  History of Present Illness:  55 yo female presented to ED with dyspnea and hypoxia with SpO2 85%.  She had fever and found to have mucus plugging.  Started on antibiotics.  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  She is followed in trach clinic Kathleen Cameron.  She has hx of respiratory arrest from mucus plugging in the setting of Corynebacterium pneumonia.  Pertinent Medical History:  Anxiety, HTN, Seizures  Significant Hospital Events:  . 5/11 Increasing SOB, presented to [facility] ED, code sepsis called, Pip-tazo>, Vanc> flagyl x1, head ct neg, CT abd pelvis grossly unremarkable, UC> , BC> . 5/12 Anxious overnight, improved with Xanax.  . 5/15 surgery consulted for abdominal pain . 5/16 GI consulted . 5/20 anxiety driven worsening respiratory mechanics; hypotension, mild AKI . 5/21 better anxiety control . 5/23 PEG fell out . 5/25 hopefully going for PEG replacement   Tests:   CT head 5/11 >> atrophy and small vessel ischemic changes  CT abd/pelvis 5/11 >> distended stomach  CT abd/pelvis 5/15 >> no acute findings  Interim History / Subjective:  Frustrated that she is in the hospital and frustrated that she needs a PEG  Otherwise NAE  Objective   Blood pressure (!) 165/106, pulse 74, temperature 98 F (36.7 C), temperature source Oral, resp. rate 20, height 5\' 1"  (1.549 m), weight 56.4 kg, SpO2 100 %.    Vent Mode: PRVC FiO2 (%):  [40 %] 40 % Set Rate:  [20 bmp] 20 bmp Vt Set:  [420 mL] 420 mL PEEP:  [5 cmH20] 5 cmH20 Plateau Pressure:  [24 cmH20-33 cmH20] 33 cmH20   Intake/Output Summary (Last 24 hours) at 09/12/2020 0841 Last data filed at 09/12/2020 0600 Gross per 24 hour  Intake 1094.99 ml  Output 650 ml  Net 444.99 ml   Filed Weights    09/10/20 0436 09/11/20 0500 09/12/20 0500  Weight: 56.8 kg 56.7 kg 56.4 kg   Physical Examination: Constitutional: Chronically ill middle aged F reclined in bed NAD  HEENT: NCAT trach secure anicteric sclera  Cardiovascular: rrr cap refill brisk  Respiratory: Symmetrical chest expansion, mechanically ventilated  Gastrointestinal: Soft, ndnt. Foley in peg stoma  Skin: c/d/w  Neurologic: AAO following commands no focal deficit  Psychiatric: slightly tearful. 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 6/17 in office, [facility])  Anxiety disorder with panic attacks -causes de-recruitment, severe hypertension, and ventilator dyssynchrony, last occurred 5/20 -Clonazepam, prn IV BZDs   Ileus - improving  -PEG replacement as below -Reglan, suppositories  Hypothyroidism -synthroid  Hx Sz disorder - no issues here, controlled with benzos  Left subclavian DVT (06/12/2020) - Lovenox (completed 87mo eliquis)   Recurrent admissions-  -complicated chronic medical issues at a fairly high acuity with home vent.  -she and husband are adamant against SNF and LTACH; unfortunately this means recurrent admissions as her baseline clinical status cannot be managed at home.  -Multiple PCCM and palliative providers have discussed this with family without much progress.  Tough situation,  continue discussions.  Dislodged PEG -IR to replace, hopefully 5/25  Best practice:  Diet: PO diet DVT prophylaxis: lovenox GI prophylaxis: Protonix Mobility: as tolerated Code Status:  full code Disposition: currently in ICU. Well documented prior discussions RE pts frequent readmissions and care team  concerns for dc home vs family's opposition to facility care. Once PEG replaced likely does not need to remain in the hospital    CCT: n/a  Tessie Fass MSN, AGACNP-BC [facility] Medicine Amion for pager 09/12/2020, 8:41 AM

## 2020-09-12 NOTE — Procedures (Signed)
Interventional Radiology Procedure Note  Procedure: Replacement of non-functional gtube. 95F balloon retention.   Complications: None  Recommendations:  - Ok to use  Signed,  Yvone Neu. Loreta Ave, DO

## 2020-09-12 NOTE — Progress Notes (Signed)
Nutrition Follow-up  DOCUMENTATION CODES:   Non-severe (moderate) malnutrition in context of chronic illness  INTERVENTION:   When able to use PEG, resume TF: Osmolite 1.5 at 50 ml/h Prosource TF 45 ml BID  Provides 1880 kcal, 97 gm protein, 914 ml free water daily.  MVI with minerals daily via tube.  Free water flushes, 200 ml 5 times per day for a total of 1914 ml free water daily.  Diet advancement per SLP.  If bolus feeding desired at home, recommend Osmolite 1.5 240 ml (1 carton) 5 times per day. Prosource TF 45 ml BID. Free water flushes 100 ml before and after each bolus. Provides 1855 kcal, 97 gm protein, 1905 ml free water daily.   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  Ongoing  GOAL:   Patient will meet greater than or equal to 90% of their needs   Unmet  MONITOR:   Diet advancement,PO intake,Vent status,Labs,TF tolerance  REASON FOR ASSESSMENT:   Ventilator,Consult Enteral/tube feeding initiation and management  ASSESSMENT:   55 yo female admitted with decreased responsiveness. In ED, several mucous plugs were removed. 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   Discussed patient in ICU rounds and with RN today. Tube feeding has been off since 5/22 when PEG was dislodged. Patient has been NPO in preparation for PEG replacement.  Previously on a regular diet with meal intakes 0-75% of meals. Average meal intake 24%.   SLP following. Patient refused water on 5/23. SLP to follow up for potential diet advancement.   When PEG is ready to use, recommend resuming TF to meet 100% of estimated nutrition needs since patient is moderately malnourished and is likely not receiving adequate nutrition PO at home.  Patient remains intubated on ventilator support MV: 7.4  L/min Temp (24hrs), Avg:97.6 F (36.4 C), Min:97.2 F (36.2 C), Max:98 F (36.7 C)   Labs reviewed.  CBG: [id number]  Medications reviewed and include Reglan, MVI, Miralax, Protonix.  IVF: D10 at 50 ml/h  I/O +5.7 L since admission UOP 650 ml x 24 hours   Diet Order:   Diet Order            Diet NPO time specified Except for: Sips with Meds  Diet effective midnight                 EDUCATION NEEDS:   No education needs have been identified at this time  Skin:  Reviewed RN Assessment  Last BM:  5/21 type 6  Height:   Ht Readings from Last 4 Encounters:  08/29/20 5\' 1"  (1.549 m)  07/21/20 5\' 5"  (1.651 m)  07/12/20 5\' 5"  (1.651 m)  07/09/20 5\' 4"  (1.626 m)    Weight:   Wt Readings from Last 1 Encounters:  09/12/20 56.4 kg    BMI:  Body mass index is 23.49 kg/m.  Estimated Nutritional Needs:   Kcal:  1800-2000  Protein:  80-100 gm  Fluid:  >/= 1.8 L    , RD, LDN, CNSC Please refer to Amion for contact information.

## 2020-09-12 NOTE — Progress Notes (Signed)
SLP Cancellation Note  Patient Details Name: Kathleen Cameron MRN: [id number] DOB: 14-Jan-1966   Cancelled treatment:       Reason Eval/Treat Not Completed: Patient at procedure or test/unavailable   Elyssa Pendelton, Riley Nearing 09/12/2020, 1:13 PM

## 2020-09-12 NOTE — Progress Notes (Signed)
PROGRESS NOTE  Kathleen Cameron CWU:[id number] DOB: 08-Aug-1965   PCP: Horald Pollen, MD  Patient is from: Home  DOA: 08/29/2020 LOS: 40  Chief complaints: Decreased responsiveness  Brief Narrative / Interim history: 55 year old F with PMH of COPD/hypoxemic and hypercapnic RF on home vent via trach, seizure disorder, dysphagia on TF via G-tube, recurrent hospitalization with respiratory failure in the setting of mucous plugging, seizure disorder, hypothyroidism and anxiety brought to ED with decreased responsiveness and desaturation to 85% on home vent, and admitted with IV fluid on chronic hypoxic and hypercapnic respiratory failure and severe sepsis.  Patient completed empiric antibiotics with IV Zosyn for 7 days.  Hospital course complicated by ileus and G-tube dislodgment that was replaced by IR on 5/25.  Family not interested in SNF or LTAC.  Plan is to discharge home with home health and DME on 5/26.  Subjective: Seen and examined this afternoon after he returned from IR procedure.  No major events overnight of this morning.  No complaints.  She denies chest pain or GI symptoms.   Objective: Vitals:   09/12/20 1140 09/12/20 1300 09/12/20 1400 09/12/20 1537  BP:  (!) 147/92 (!) 141/96   Pulse:  61 (!) 54   Resp:  20 20   Temp: 97.6 F (36.4 C)   97.8 F (36.6 C)  TempSrc: Oral   Oral  SpO2:  100% 100%   Weight:      Height:        Intake/Output Summary (Last 24 hours) at 09/12/2020 1546 Last data filed at 09/12/2020 1000 Gross per 24 hour  Intake 948.74 ml  Output 250 ml  Net 698.74 ml   Filed Weights   09/10/20 0436 09/11/20 0500 09/12/20 0500  Weight: 56.8 kg 56.7 kg 56.4 kg    Examination:  GENERAL: No apparent distress.  Nontoxic. HEENT: MMM.  Vision and hearing grossly intact.  NECK: Supple.  No apparent JVD.  RESP: On MV via trach.  Fair aeration bilaterally. CVS:  RRR. Heart sounds normal.  ABD/GI/GU: BS+. Abd soft, NTND.  PEG tube in  place. MSK/EXT:  Moves extremities. No apparent deformity. No edema.  SKIN: no apparent skin lesion or wound NEURO: Awake and alert.  Seems to be fairly oriented.  Follows commands.  No apparent focal neuro deficit. PSYCH: Calm. Normal affect.  Procedures:  5/25-PEG tube replacement by IR  Microbiology summarized: 5/11-COVID-19 and influenza PCR nonreactive 5/11-MRSA PCR screen negative 5/11-blood culture with pansensitive Staph capitis in aerobic bottles 5/12-blood culture negative  Assessment & Plan: Acute on chronic RF with hypoxia and hypercapnia due to tracheobronchitis and mucous plugging Severe chronic COPD with emphysema Severe sepsis due to tracheobronchitis -Completed 7 days of IV Zosyn -PCCM managing vent -Plan for discharge home on 5/26  Dysphagia//PEG tube dependent -PEG tube replaced by IR on 5/25 -Resume TF per dietitian -Discontinue D10 once tube feed tolerated  Ileus: Seems to have resolved. -Obtain KUB -Continue MiraLAX per tube   Acute urinary retention: Resolved.  Iron deficiency anemia: Hgb dropped about 1.5 g in the last 24 hours.  No obvious signs of bleeding.  Has not had bowel movement in 3 days. Recent Labs    09/05/20 0020 09/06/20 0134 09/07/20 0323 09/07/20 0549 09/07/20 1601 09/08/20 0434 09/09/20 0015 09/10/20 0115 09/11/20 0333 09/12/20 0133  HGB 9.0* 8.3* 9.0* 11.6* 10.0* 9.7* 9.5* 8.9* 8.8* 7.3*  -Recheck H&H this afternoon -Transfuse for Hgb less than 7.0 -Check anemia panel   Hypothyroidism: Stable -Continue Synthroid  Generalized abdominal pain: Seems to have resolved. -See ileus  History of seizure disorder: -Not on antiepileptic medications.   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  Moderate malnutrition Body mass index is 23.49 kg/m. Nutrition Problem: Moderate Malnutrition Etiology: chronic illness (COPD, trach on chronic vent support) Signs/Symptoms: mild fat depletion,mild muscle depletion,moderate muscle depletion Interventions: Refer to RD note for recommendations   DVT prophylaxis:  SCDs Start: 08/29/20 2024  Code Status: Full code Family Communication: Updated patient's husband over the phone Level of care: ICU Status is: Inpatient  Remains inpatient appropriate because:IV treatments appropriate due to intensity of illness or inability to take PO   Dispo: The patient is from: Home              Anticipated d/c is to: Home on 5/26              Patient currently is not medically stable to d/c.   Difficult to place patient No       Consultants:  PCCM IR   Sch Meds:  Scheduled Meds: . arformoterol  15 mcg Nebulization BID  . bisacodyl  10 mg Rectal Q0600  . budesonide  0.25 mg Nebulization BID  . chlorhexidine gluconate (MEDLINE KIT)  15 mL Mouth Rinse BID  . Chlorhexidine Gluconate Cloth  6 each Topical Daily  . clonazePAM  1 mg Per Tube TID  . feeding supplement (PROSource TF)  45 mL Per Tube BID  . levothyroxine  88 mcg Per Tube Q0600  . lidocaine      . mouth rinse  15 mL Mouth Rinse 10 times per day  . metoCLOPramide (REGLAN) injection  10 mg Intravenous Q8H  . multivitamin with minerals  1 tablet Per Tube Daily  . pantoprazole sodium  40 mg Per Tube Daily  . polyethylene glycol  17 g Per Tube Daily  . revefenacin  175 mcg Nebulization Daily  . rosuvastatin  40 mg Per Tube Daily  . simethicone  40 mg Per Tube Q6H  . temazepam  15 mg Per Tube QHS  . venlafaxine  37.5 mg Per Tube BID WC   Continuous Infusions: . sodium chloride Stopped (08/31/20 2128)  . sodium chloride Stopped (08/31/20 1907)  . dextrose 50 mL/hr at 09/12/20 1139  . feeding supplement (OSMOLITE 1.5 CAL) Stopped (09/09/20 1920)   PRN Meds:.sodium  chloride, acetaminophen, albuterol, docusate, HYDROmorphone (DILAUDID) injection, labetalol, ondansetron (ZOFRAN) IV, phenol, sodium chloride  Antimicrobials: Anti-infectives (From admission, onward)   Start     Dose/Rate Route Frequency Ordered Stop   08/30/20 1830  vancomycin (VANCOREADY) IVPB 1000 mg/200 mL  Status:  Discontinued        1,000 mg 200 mL/hr over 60 Minutes Intravenous Every 24 hours 08/30/20 0847 08/30/20 1038   08/30/20 1830  vancomycin (VANCOREADY) IVPB 750 mg/150 mL  Status:  Discontinued        750 mg 150 mL/hr over 60 Minutes Intravenous Every 24 hours 08/30/20 1038 09/01/20 1322   08/30/20 0600  vancomycin (VANCOREADY) IVPB 750 mg/150 mL  Status:  Discontinued        750 mg 150 mL/hr over 60 Minutes Intravenous Every 12 hours 08/29/20 1822 08/30/20 0847   08/30/20 0400  ceFEPIme (MAXIPIME) 2 g in sodium chloride 0.9 % 100 mL IVPB  Status:  Discontinued        2 g 200 mL/hr over 30 Minutes Intravenous Every 8 hours 08/29/20 1822 08/29/20 2038   08/29/20 2200  piperacillin-tazobactam (ZOSYN) IVPB 3.375 g        3.375 g 12.5 mL/hr over 240 Minutes Intravenous Every 8 hours 08/29/20 2038 09/05/20 0140   08/29/20 2030  piperacillin-tazobactam (ZOSYN) IVPB 3.375 g  Status:  Discontinued        3.375 g 100 mL/hr over 30 Minutes Intravenous Every 6 hours 08/29/20 2021 08/29/20 2038   08/29/20 1715  ceFEPIme (MAXIPIME) 2 g in sodium chloride 0.9 % 100 mL IVPB  Status:  Discontinued        2 g 200 mL/hr over 30 Minutes Intravenous  Once 08/29/20 1700 08/29/20 2033   08/29/20 1715  metroNIDAZOLE (FLAGYL) IVPB 500 mg        500 mg 100 mL/hr over 60 Minutes Intravenous  Once 08/29/20 1700 08/29/20 1824   08/29/20 1715  vancomycin (VANCOREADY) IVPB 1250 mg/250 mL        1,250 mg 166.7 mL/hr over 90 Minutes Intravenous  Once 08/29/20 1700 08/29/20 2028       I have personally reviewed the following labs and images: CBC: Recent Labs  Lab 09/08/20 0434 09/09/20 0015  09/10/20 0115 09/11/20 0333 09/12/20 0133  WBC 8.6 10.1 10.7* 7.4 7.0  HGB 9.7* 9.5* 8.9* 8.8* 7.3*  HCT 32.4* 31.6* 29.6* 28.6* 24.1*  MCV 86.6 86.3 87.1 85.4 85.5  PLT 361 272 254 184 260   BMP &GFR Recent Labs  Lab 09/07/20 0323 09/07/20 0549 09/08/20 0434 09/09/20 0015 09/10/20 0115 09/11/20 0333 09/12/20 0133  NA 138   < > 138 136 135 138 136  K 4.1   < > 4.4 4.5 4.9 4.3 3.7  CL 101  --  94* 94* 94* 97* 96*  CO2 28  --  36* 35* 34* 32 33*  GLUCOSE 117*  --  112* 124* 82 81 105*  BUN 19  --  33* 37* 28* 17 13  CREATININE 0.65  --  1.32* 1.09* 0.74 0.69 0.71  CALCIUM 9.3  --  9.9 9.6 9.5 9.7 9.7  MG 1.8  --  2.0 2.0 1.9 1.9 1.9  PHOS 3.8  --   --  2.5 2.7 3.3  --    < > = values in this interval not displayed.   Estimated Creatinine Clearance: 58.5 mL/min (by C-G formula based on SCr of 0.71 mg/dL). Liver & Pancreas: No results for input(s): AST, ALT, ALKPHOS, BILITOT, PROT, ALBUMIN in the last 168 hours. No results for input(s): LIPASE, AMYLASE in the last 168 hours. No results for input(s): AMMONIA in the last 168 hours. Diabetic: No results for input(s): HGBA1C in the last 72 hours. Recent Labs  Lab 09/11/20 2316 09/12/20 0306 09/12/20 0712 09/12/20 1107 09/12/20 1500  GLUCAP 95 98 78 89 65*   Cardiac Enzymes: No results for input(s): CKTOTAL, CKMB, CKMBINDEX, TROPONINI in the last 168 hours. No results for input(s): PROBNP in the last 8760 hours. Coagulation Profile: No results for input(s): INR, PROTIME in the last 168 hours. Thyroid Function Tests: No results for input(s): TSH, T4TOTAL, FREET4, T3FREE, THYROIDAB in the last 72 hours. Lipid Profile: No results for input(s): CHOL, HDL, LDLCALC, TRIG, CHOLHDL, LDLDIRECT in the last 72 hours. Anemia Panel: No results for input(s): VITAMINB12, FOLATE, FERRITIN, TIBC, IRON, RETICCTPCT in the last 72 hours. Urine analysis:    Component Value Date/Time   COLORURINE  YELLOW 08/29/2020 1645   APPEARANCEUR  HAZY (A) 08/29/2020 1645   LABSPEC 1.009 08/29/2020 1645   PHURINE 8.0 08/29/2020 1645   GLUCOSEU 50 (A) 08/29/2020 [place] 08/29/2020 [place] 08/29/2020 [place] 08/29/2020 1645   PROTEINUR 100 (A) 08/29/2020 1645   UROBILINOGEN 1.0 11/12/2008 1440   NITRITE NEGATIVE 08/29/2020 1645   LEUKOCYTESUR NEGATIVE 08/29/2020 1645   Sepsis Labs: Invalid input(s): PROCALCITONIN, [place]  Microbiology: No results found for this or any previous visit (from the past 240 hour(s)).  Radiology Studies: No results found.    Darrik Richman T. [place]  If 7PM-7AM, please contact night-coverage [email or web address] 09/12/2020, 3:46 PM

## 2020-09-12 NOTE — Progress Notes (Signed)
Pt transported to IR and back to Unit without any complications. RN at bedside.

## 2020-09-12 NOTE — Progress Notes (Signed)
Pt was placed on home vent by family member. RN aware. Vent on stand by if needed.

## 2020-09-13 DIAGNOSIS — D509 Iron deficiency anemia, unspecified: Secondary | ICD-10-CM | POA: Diagnosis not present

## 2020-09-13 DIAGNOSIS — E440 Moderate protein-calorie malnutrition: Secondary | ICD-10-CM | POA: Diagnosis not present

## 2020-09-13 DIAGNOSIS — J9621 Acute and chronic respiratory failure with hypoxia: Secondary | ICD-10-CM | POA: Diagnosis not present

## 2020-09-13 DIAGNOSIS — A419 Sepsis, unspecified organism: Secondary | ICD-10-CM | POA: Diagnosis not present

## 2020-09-13 DIAGNOSIS — R1312 Dysphagia, oropharyngeal phase: Secondary | ICD-10-CM | POA: Diagnosis not present

## 2020-09-13 LAB — CBC
HCT: 29.4 % — ABNORMAL LOW (ref 36.0–46.0)
Hemoglobin: 9 g/dL — ABNORMAL LOW (ref 12.0–15.0)
MCH: 25.9 pg — ABNORMAL LOW (ref 26.0–34.0)
MCHC: 30.6 g/dL (ref 30.0–36.0)
MCV: 84.7 fL (ref 80.0–100.0)
Platelets: 233 10*3/uL (ref 150–400)
RBC: 3.47 MIL/uL — ABNORMAL LOW (ref 3.87–5.11)
RDW: 17.2 % — ABNORMAL HIGH (ref 11.5–15.5)
WBC: 10.2 10*3/uL (ref 4.0–10.5)
nRBC: 0 % (ref 0.0–0.2)

## 2020-09-13 LAB — GLUCOSE, CAPILLARY
Glucose-Capillary: 101 mg/dL — ABNORMAL HIGH (ref 70–99)
Glucose-Capillary: 113 mg/dL — ABNORMAL HIGH (ref 70–99)
Glucose-Capillary: 146 mg/dL — ABNORMAL HIGH (ref 70–99)
Glucose-Capillary: 103 mg/dL — ABNORMAL HIGH (ref 70–99)
Glucose-Capillary: 149 mg/dL — ABNORMAL HIGH (ref 70–99)
Glucose-Capillary: 150 mg/dL — ABNORMAL HIGH (ref 70–99)

## 2020-09-13 LAB — RENAL FUNCTION PANEL
Albumin: 2.5 g/dL — ABNORMAL LOW (ref 3.5–5.0)
Anion gap: 10 (ref 5–15)
BUN: 16 mg/dL (ref 6–20)
CO2: 30 mmol/L (ref 22–32)
Calcium: 9.7 mg/dL (ref 8.9–10.3)
Chloride: 97 mmol/L — ABNORMAL LOW (ref 98–111)
Creatinine, Ser: 1.03 mg/dL — ABNORMAL HIGH (ref 0.44–1.00)
GFR, Estimated: >60 mL/min (ref >60)
Glucose, Bld: 99 mg/dL (ref 70–99)
Phosphorus: 4.6 mg/dL (ref 2.5–4.6)
Potassium: 4.2 mmol/L (ref 3.5–5.1)
Sodium: 137 mmol/L (ref 135–145)

## 2020-09-13 LAB — MAGNESIUM: Magnesium: 1.8 mg/dL (ref 1.7–2.4)

## 2020-09-13 MED ORDER — AMLODIPINE BESYLATE 5 MG PO TABS: 5.0000 mg | ORAL_TABLET | Freq: Every day | ORAL | Status: DC

## 2020-09-13 MED ORDER — AMLODIPINE BESYLATE 5 MG PO TABS
5.0000 mg | ORAL_TABLET | Freq: Every day | ORAL | Status: DC
  Administered 2020-09-13: 5 mg
  Filled 2020-09-13: qty 1

## 2020-09-13 MED ORDER — LORAZEPAM 2 MG/ML IJ SOLN
0.5000 mg | Freq: Once | INTRAMUSCULAR | Status: AC
  Administered 2020-09-13: 0.5 mg via INTRAVENOUS
  Filled 2020-09-13: qty 1

## 2020-09-13 MED ORDER — ENOXAPARIN SODIUM 40 MG/0.4ML IJ SOSY
40.0000 mg | PREFILLED_SYRINGE | INTRAMUSCULAR | Status: DC
  Administered 2020-09-13 – 2020-09-17 (×5): 40 mg via SUBCUTANEOUS
  Filled 2020-09-13 (×5): qty 0.4

## 2020-09-13 NOTE — Progress Notes (Addendum)
PROGRESS NOTE  Kathleen Cameron FBP:[id number] DOB: [birth year]   PCP: Horald Pollen, MD  Patient is from: Home  DOA: 08/29/2020 LOS: 70  Chief complaints: Decreased responsiveness  Brief Narrative / Interim history: 55 year old F with PMH of COPD/hypoxemic and hypercapnic RF on home vent via trach, seizure disorder, dysphagia on TF via G-tube, recurrent hospitalization with respiratory failure in the setting of mucous plugging, seizure disorder, hypothyroidism and anxiety brought to ED with decreased responsiveness and desaturation to 85% on home vent, and admitted with IV fluid on chronic hypoxic and hypercapnic respiratory failure and severe sepsis.  Patient completed empiric antibiotics with IV Zosyn for 7 days.  Hospital course complicated by ileus and G-tube dislodgment that was replaced by IR on 5/25.  Family not interested in SNF or LTAC.  Patient is a stable for discharge home with home health but home health agency can't provide [facility] until 5/31.  Patient needs 24/7 [facility] RN.  Subjective: Seen and examined earlier this morning.  No major events overnight or this morning.  No complaints.  She denies pain, shortness of breath, GI or UTI symptoms.  Reports passing gas but has not a bowel movement yet.  She spent the night on home vent.  Objective: Vitals:   09/13/20 0743 09/13/20 0748 09/13/20 0800 09/13/20 1122  BP:      Pulse:  95 96   Resp:  16 18   Temp: (!) 97.5 F (36.4 C)   98.1 F (36.7 C)  TempSrc: Oral   Axillary  SpO2:  100% 100%   Weight:      Height:        Intake/Output Summary (Last 24 hours) at 09/13/2020 1313 Last data filed at 09/13/2020 0809 Gross per 24 hour  Intake 1091.87 ml  Output 400 ml  Net 691.87 ml   Filed Weights   09/11/20 0500 09/12/20 0500 09/13/20 0500  Weight: 56.7 kg 56.4 kg 56.2 kg    Examination:  GENERAL: No apparent distress.  Nontoxic. HEENT: MMM.  Vision and hearing grossly intact.  NECK: Tracheostomy.  No apparent  JVD. RESP: On MV via trach.  No IWOB.  Fair aeration bilaterally. CVS:  RRR. Heart sounds normal.  ABD/GI/GU: BS+. Abd soft, NTND.  PEG tube in place. MSK/EXT:  Moves extremities. No apparent deformity. No edema SKIN: no apparent skin lesion or wound NEURO: Awake and alert. Oriented appropriately.  No apparent focal neuro deficit. PSYCH: Calm. Normal affect.   Procedures:  5/25-PEG tube replacement by IR  Microbiology summarized: 5/11-COVID-19 and influenza PCR nonreactive 5/11-MRSA PCR screen negative 5/11-blood culture with pansensitive Staph capitis in aerobic bottles 5/12-blood culture negative  Assessment & Plan: Acute on chronic RF with hypoxia and hypercapnia due to tracheobronchitis and mucous plugging Severe chronic COPD with emphysema Severe sepsis due to tracheobronchitis -Completed 7 days of IV Zosyn -PCCM managing vent-doing well on home vent.  Abnormal blood culture: blood culture with pansensitive Staph capitis in aerobic bottles likely contaminant.  Regardless, cover with antibiotic as above  PEG tube dependence-also taking regular diet by mouth. -PEG tube replaced by IR on 5/25 -Tube feed resumed.  Also taking regular diet by mouth  Ileus/generalized abdominal pain: KUB with dilated small bowel without obstruction but pain resolved and patient is passing gas and tolerating tube feed. -Continue MiraLAX per tube   Acute urinary retention: Resolved.  Iron deficiency anemia: H&H relatively stable.  Anemia panel basically normal. Recent Labs    09/07/20 0323 09/07/20 0549 09/07/20 1601 09/08/20 0434  09/09/20 0015 09/10/20 0115 09/11/20 0333 09/12/20 0133 09/12/20 2123 09/13/20 0212  HGB 9.0* 11.6* 10.0* 9.7* 9.5* 8.9* 8.8* 7.3* 8.3* 9.0*  -Monitor intermittently  Hypothyroidism: Stable -Continue Synthroid  History of seizure disorder: -Not on antiepileptic medications.   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  Moderate malnutrition Body mass index is 23.41 kg/m. Nutrition Problem: Moderate Malnutrition Etiology: chronic illness (COPD, trach on chronic vent support) Signs/Symptoms: mild fat depletion,mild muscle depletion,moderate muscle depletion Interventions: Refer to RD note for recommendations   DVT prophylaxis:  SCDs Start: 08/29/20 2024 Resume subcu Lovenox per pharmacy.  Code Status: Full code Family Communication: Updated patient's husband over the phone on 5/25 Level of care: ICU Status is: Inpatient  Remains inpatient appropriate because:Unsafe d/c plan.  Patient needs [facility] RN 24/7 that won't be available until 5/31.    Dispo: The patient is from: Home              Anticipated d/c is to: Home              Patient currently is medically stable to d/c.   Difficult to place patient No       Consultants:  PCCM IR   Sch Meds:  Scheduled Meds: . arformoterol  15 mcg Nebulization BID  . bisacodyl  10 mg Rectal Q0600  . budesonide  0.25 mg Nebulization BID  . chlorhexidine gluconate (MEDLINE KIT)  15 mL Mouth Rinse BID  . Chlorhexidine Gluconate Cloth  6 each Topical Daily  . clonazePAM  0.5 mg Per Tube TID  . feeding supplement (PROSource TF)  45 mL Per Tube BID  . levothyroxine  88 mcg Per Tube Q0600  . mouth rinse  15 mL Mouth Rinse 10 times per day  . metoCLOPramide (REGLAN) injection  10 mg Intravenous Q8H  . multivitamin with minerals  1 tablet Per Tube Daily  . pantoprazole sodium  40 mg Per Tube Daily  . polyethylene glycol  17 g Per Tube BID  . revefenacin  175 mcg Nebulization Daily  . rosuvastatin  40 mg Per Tube Daily  . simethicone  40 mg Per Tube Q6H  . temazepam  15 mg Per Tube QHS  . venlafaxine  37.5 mg Per Tube BID WC   Continuous Infusions: . sodium chloride Stopped (08/31/20  2128)  . sodium chloride Stopped (08/31/20 1907)  . dextrose Stopped (09/12/20 1206)  . feeding supplement (OSMOLITE 1.5 CAL) 50 mL/hr at 09/12/20 2000   PRN Meds:.sodium chloride, acetaminophen, albuterol, docusate, HYDROmorphone (DILAUDID) injection, labetalol, ondansetron (ZOFRAN) IV, phenol, sodium chloride  Antimicrobials: Anti-infectives (From admission, onward)   Start     Dose/Rate Route Frequency Ordered Stop   08/30/20 1830  vancomycin (VANCOREADY) IVPB 1000 mg/200 mL  Status:  Discontinued        1,000 mg 200 mL/hr over 60 Minutes Intravenous Every 24 hours 08/30/20 0847 08/30/20 1038   08/30/20 1830  vancomycin (VANCOREADY) IVPB 750 mg/150 mL  Status:  Discontinued        750 mg 150 mL/hr over 60 Minutes Intravenous Every 24 hours 08/30/20 1038 09/01/20 1322   08/30/20 0600  vancomycin (VANCOREADY) IVPB 750 mg/150 mL  Status:  Discontinued        750 mg 150 mL/hr over 60 Minutes Intravenous Every 12 hours 08/29/20 1822 08/30/20 0847   08/30/20 0400  ceFEPIme (  MAXIPIME) 2 g in sodium chloride 0.9 % 100 mL IVPB  Status:  Discontinued        2 g 200 mL/hr over 30 Minutes Intravenous Every 8 hours 08/29/20 1822 08/29/20 2038   08/29/20 2200  piperacillin-tazobactam (ZOSYN) IVPB 3.375 g        3.375 g 12.5 mL/hr over 240 Minutes Intravenous Every 8 hours 08/29/20 2038 09/05/20 0140   08/29/20 2030  piperacillin-tazobactam (ZOSYN) IVPB 3.375 g  Status:  Discontinued        3.375 g 100 mL/hr over 30 Minutes Intravenous Every 6 hours 08/29/20 2021 08/29/20 2038   08/29/20 1715  ceFEPIme (MAXIPIME) 2 g in sodium chloride 0.9 % 100 mL IVPB  Status:  Discontinued        2 g 200 mL/hr over 30 Minutes Intravenous  Once 08/29/20 1700 08/29/20 2033   08/29/20 1715  metroNIDAZOLE (FLAGYL) IVPB 500 mg        500 mg 100 mL/hr over 60 Minutes Intravenous  Once 08/29/20 1700 08/29/20 1824   08/29/20 1715  vancomycin (VANCOREADY) IVPB 1250 mg/250 mL        1,250 mg 166.7 mL/hr over 90  Minutes Intravenous  Once 08/29/20 1700 08/29/20 2028       I have personally reviewed the following labs and images: CBC: Recent Labs  Lab 09/09/20 0015 09/10/20 0115 09/11/20 0333 09/12/20 0133 09/12/20 2123 09/13/20 0212  WBC 10.1 10.7* 7.4 7.0  --  10.2  HGB 9.5* 8.9* 8.8* 7.3* 8.3* 9.0*  HCT 31.6* 29.6* 28.6* 24.1* 27.3* 29.4*  MCV 86.3 87.1 85.4 85.5  --  84.7  PLT 272 254 184 260  --  233   BMP &GFR Recent Labs  Lab 09/07/20 0323 09/07/20 0549 09/09/20 0015 09/10/20 0115 09/11/20 0333 09/12/20 0133 09/13/20 0212  NA 138   < > 136 135 138 136 137  K 4.1   < > 4.5 4.9 4.3 3.7 4.2  CL 101   < > 94* 94* 97* 96* 97*  CO2 28   < > 35* 34* 32 33* 30  GLUCOSE 117*   < > 124* 82 81 105* 99  BUN 19   < > 37* 28* $Remov'17 13 16  'HkLOUk$ CREATININE 0.65   < > 1.09* 0.74 0.69 0.71 1.03*  CALCIUM 9.3   < > 9.6 9.5 9.7 9.7 9.7  MG 1.8   < > 2.0 1.9 1.9 1.9 1.8  PHOS 3.8  --  2.5 2.7 3.3  --  4.6   < > = values in this interval not displayed.   Estimated Creatinine Clearance: 45.5 mL/min (A) (by C-G formula based on SCr of 1.03 mg/dL (H)). Liver & Pancreas: Recent Labs  Lab 09/13/20 0212  ALBUMIN 2.5*   No results for input(s): LIPASE, AMYLASE in the last 168 hours. No results for input(s): AMMONIA in the last 168 hours. Diabetic: No results for input(s): HGBA1C in the last 72 hours. Recent Labs  Lab 09/12/20 1915 09/12/20 2312 09/13/20 0312 09/13/20 0710 09/13/20 1105  GLUCAP 102* 100* 101* 113* 146*   Cardiac Enzymes: No results for input(s): CKTOTAL, CKMB, CKMBINDEX, TROPONINI in the last 168 hours. No results for input(s): PROBNP in the last 8760 hours. Coagulation Profile: No results for input(s): INR, PROTIME in the last 168 hours. Thyroid Function Tests: No results for input(s): TSH, T4TOTAL, FREET4, T3FREE, THYROIDAB in the last 72 hours. Lipid Profile: No results for input(s): CHOL, HDL, LDLCALC, TRIG, CHOLHDL, LDLDIRECT in the last  72 hours. Anemia  Panel: Recent Labs    09/12/20 1617 09/12/20 2123  VITAMINB12 1,490*  --   FOLATE 36.2  --   FERRITIN 129  --   TIBC 365  --   IRON 83  --   RETICCTPCT  --  2.7   Urine analysis:    Component Value Date/Time   COLORURINE YELLOW 08/29/2020 1645   APPEARANCEUR HAZY (A) 08/29/2020 1645   LABSPEC 1.009 08/29/2020 1645   PHURINE 8.0 08/29/2020 1645   GLUCOSEU 50 (A) 08/29/2020 1645   HGBUR NEGATIVE 08/29/2020 [place] 08/29/2020 [place] 08/29/2020 1645   PROTEINUR 100 (A) 08/29/2020 1645   UROBILINOGEN 1.0 11/12/2008 1440   NITRITE NEGATIVE 08/29/2020 1645   LEUKOCYTESUR NEGATIVE 08/29/2020 1645   Sepsis Labs: Invalid input(s): PROCALCITONIN, [place]  Microbiology: No results found for this or any previous visit (from the past 240 hour(s)).  Radiology Studies: DG Abd Portable 1V  Result Date: 09/12/2020 CLINICAL DATA:  55 year old female with ileus. EXAM: PORTABLE ABDOMEN - 1 VIEW COMPARISON:  Abdominal radiograph dated 09/03/2020. FINDINGS: Fifteen the gastrostomy in similar position. Interval decrease in the previously seen dilated small bowel loops. Air is noted throughout the colon. No free air or radiopaque calculi. The osseous structures are intact. IMPRESSION: Interval decrease in the previously seen dilated small bowel loops. No evidence of obstruction. Electronically Signed   By: Anner Crete M.D.   On: 09/12/2020 17:36      Dayron Odland T. [place]  If 7PM-7AM, please contact night-coverage [email or web address] 09/13/2020, 1:13 PM

## 2020-09-13 NOTE — Progress Notes (Addendum)
NCM called and alerted [facility] of d/c plan. Voice message left...awaiting call back  [facility] needs 24 hrs notification to arrange nursing coverage for home. Pt also needs to be on home vent 24hrs prior to d/c, MD made aware.   TOC team will continue to monitor and assist with TOC needs... Gae Gallop RN,BSN,CM  09/13/2020 @ 1009 NCM received call from [facility] (respiratory therapist )Rhea Belton, [phone]. NMC made Ryan aware of potential d/c for today vs tomorrow. Still awaiting call back from Rob/ [facility].  09/13/2020 @ 1030 NCM received call from Rob/ [facility]. Rob informed NCM they will not be able to receive pt until 5/31 Tuesday am. States has no nurse for today, they don't receive pts on Fridays/ weekend, Monday is a Holiday ( no nurse), MD/ nurse made aware. Pt asleep, NCM will inform husband.

## 2020-09-13 NOTE — Progress Notes (Signed)
NAME:  Kathleen Cameron, MRN:  [id number], DOB:  28-Jan-1966, LOS: 15 ADMISSION DATE:  08/29/2020, CONSULTATION DATE:  08/29/2020 REFERRING MD:  Dr. Allena Katz CHIEF COMPLAINT:  SOB  History of Present Illness:  55 yo female presented to ED with dyspnea and hypoxia with SpO2 85%.  She had fever and found to have mucus plugging.  Started on antibiotics.  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  She is followed in trach clinic [name].  She has hx of respiratory arrest from mucus plugging in the setting of Corynebacterium pneumonia.  Pertinent Medical History:  Anxiety, HTN, Seizures  Significant Hospital Events:  . 5/11 Increasing SOB, presented to [facility] ED, code sepsis called, Pip-tazo>, Vanc> flagyl x1, head ct neg, CT abd pelvis grossly unremarkable, UC> , BC> . 5/12 Anxious overnight, improved with Xanax.  . 5/15 surgery consulted for abdominal pain . 5/16 GI consulted . 5/20 anxiety driven worsening respiratory mechanics; hypotension, mild AKI . 5/21 better anxiety control . 5/23 PEG fell out . 5/25 PEG replaced  Tests:   CT head 5/11 >> atrophy and small vessel ischemic changes  CT abd/pelvis 5/11 >> distended stomach  CT abd/pelvis 5/15 >> no acute findings  Interim History / Subjective:  Alerts and oriented. In good spirit. Follows all commands. She is happy about going home today.  Objective   Blood pressure 114/88, pulse 69, temperature 97.6 F (36.4 C), temperature source Oral, resp. rate 16, height 5\' 1"  (1.549 m), weight 56.2 kg, SpO2 100 %.    Vent Mode: AC FiO2 (%):  [30 %-40 %] 30 % Set Rate:  [16 bmp-20 bmp] 16 bmp Vt Set:  [420 mL-480 mL] 480 mL PEEP:  [5 cmH20] 5 cmH20 Plateau Pressure:  [14 cmH20] 14 cmH20   Intake/Output Summary (Last 24 hours) at 09/13/2020 0739 Last data filed at 09/13/2020 0600 Gross per 24 hour  Intake 1090.77 ml  Output 400 ml  Net 690.77 ml   Filed Weights   09/11/20 0500  09/12/20 0500 09/13/20 0500  Weight: 56.7 kg 56.4 kg 56.2 kg   Physical Examination:  Gen:      No acute distress HEENT:  sclera anicteric Neck:     Trach in place Lungs:    Trach in place and on ventilator. Course breath sounds and diffuse rhonchi CV:         Regular rate and rhythm; no murmurs Abd:      + bowel sounds; PEG in place. Abdomen is soft, non-tender, no distension Ext:    No edema; adequate peripheral perfusion Skin:      Warm and dry; no rash Neuro:    Alert, follows commands. Moves all of her extremities Psych:   Nl mood and affect   Resolved Hospital Problem list   AKI Hypotension  Assessment & Plan:   Acute on chronic hypoxic respiratory failure from tracheobronchitis, mucus plugging Completed abx 5/17 Chronic vent/trach Severe COPD with emphysema  -Improved - Assist control ventilation - Routine trach care -DC home today.  - Needs OP follow up (Dr. 6/17 in office, [facility])  Anxiety disorder with panic attacks Stable today -Causes de-recruitment, severe hypertension, and ventilator dyssynchrony, last occurred 5/20 -PRN colazepam  Ileus - improving  -PEG replaced by IR yesterday 5/25 -Continue PEG care and bowel regimen  Hypothyroidism -Continue synthroid  Hx Sz disorder - No event during this hospitalization. - controlled with benzos  Left subclavian DVT (06/12/2020) -  Lovenox (completed 64mo eliquis)   Recurrent admissions-  -Complicated chronic medical issues at a fairly high acuity with home vent.  -she and husband are adamant against SNF and LTACH; unfortunately this means recurrent admissions as her baseline clinical status cannot be managed at home. Multiple PCCM and palliative providers have discussed this with family without much progress.  Tough situation, continue discussions.  -Plan is discharging to home today with HH  Dislodged PEG -IR replace PEG 5/25  Best practice:  Diet: PO diet DVT prophylaxis: lovenox GI  prophylaxis: Protonix Mobility: as tolerated Code Status:  full code Disposition: DC home today

## 2020-09-14 ENCOUNTER — Inpatient Hospital Stay (HOSPITAL_COMMUNITY): Payer: Medicaid Other

## 2020-09-14 DIAGNOSIS — J9621 Acute and chronic respiratory failure with hypoxia: Secondary | ICD-10-CM | POA: Diagnosis not present

## 2020-09-14 DIAGNOSIS — E440 Moderate protein-calorie malnutrition: Secondary | ICD-10-CM | POA: Diagnosis not present

## 2020-09-14 DIAGNOSIS — A419 Sepsis, unspecified organism: Secondary | ICD-10-CM | POA: Diagnosis not present

## 2020-09-14 DIAGNOSIS — I10 Essential (primary) hypertension: Secondary | ICD-10-CM

## 2020-09-14 DIAGNOSIS — R7881 Bacteremia: Secondary | ICD-10-CM

## 2020-09-14 DIAGNOSIS — D509 Iron deficiency anemia, unspecified: Secondary | ICD-10-CM | POA: Diagnosis not present

## 2020-09-14 DIAGNOSIS — R1312 Dysphagia, oropharyngeal phase: Secondary | ICD-10-CM | POA: Diagnosis not present

## 2020-09-14 LAB — POCT I-STAT 7, (LYTES, BLD GAS, ICA,H+H)
Acid-Base Excess: 17 mmol/L — ABNORMAL HIGH (ref 0.0–2.0)
Bicarbonate: 44.6 mmol/L — ABNORMAL HIGH (ref 20.0–28.0)
Calcium, Ion: 1.37 mmol/L (ref 1.15–1.40)
HCT: 29 % — ABNORMAL LOW (ref 36.0–46.0)
Hemoglobin: 9.9 g/dL — ABNORMAL LOW (ref 12.0–15.0)
O2 Saturation: 93 %
Patient temperature: 98.1
Potassium: 3.9 mmol/L (ref 3.5–5.1)
Sodium: 140 mmol/L (ref 135–145)
TCO2: 47 mmol/L — ABNORMAL HIGH (ref 22–32)
pCO2 arterial: 75.4 mmHg (ref 32.0–48.0)
pH, Arterial: 7.378 (ref 7.350–7.450)
pO2, Arterial: 73 mmHg — ABNORMAL LOW (ref 83.0–108.0)

## 2020-09-14 LAB — GLUCOSE, CAPILLARY
Glucose-Capillary: 124 mg/dL — ABNORMAL HIGH (ref 70–99)
Glucose-Capillary: 135 mg/dL — ABNORMAL HIGH (ref 70–99)
Glucose-Capillary: 136 mg/dL — ABNORMAL HIGH (ref 70–99)
Glucose-Capillary: 141 mg/dL — ABNORMAL HIGH (ref 70–99)
Glucose-Capillary: 146 mg/dL — ABNORMAL HIGH (ref 70–99)
Glucose-Capillary: 173 mg/dL — ABNORMAL HIGH (ref 70–99)

## 2020-09-14 MED ORDER — MIDAZOLAM HCL 2 MG/2ML IJ SOLN
2.0000 mg | Freq: Once | INTRAMUSCULAR | Status: AC
  Administered 2020-09-14: 2 mg via INTRAVENOUS
  Filled 2020-09-14: qty 2

## 2020-09-14 MED ORDER — POLYETHYLENE GLYCOL 3350 17 G PO PACK: 17.0000 g | PACK | Freq: Every day | ORAL | Status: DC | PRN

## 2020-09-14 MED ORDER — CLONIDINE HCL 0.2 MG PO TABS
0.1000 mg | ORAL_TABLET | Freq: Two times a day (BID) | ORAL | Status: DC
  Administered 2020-09-14: 0.1 mg
  Filled 2020-09-14: qty 1

## 2020-09-14 MED ORDER — CLONIDINE HCL 0.2 MG PO TABS
0.1000 mg | ORAL_TABLET | Freq: Three times a day (TID) | ORAL | Status: DC
  Administered 2020-09-14 – 2020-09-16 (×7): 0.1 mg
  Filled 2020-09-14 (×8): qty 1

## 2020-09-14 MED ORDER — AMLODIPINE BESYLATE 10 MG PO TABS
10.0000 mg | ORAL_TABLET | Freq: Every day | ORAL | Status: DC
  Administered 2020-09-14 – 2020-09-18 (×5): 10 mg
  Filled 2020-09-14 (×5): qty 1

## 2020-09-14 NOTE — Progress Notes (Signed)
eLink Physician-Brief Progress Note Patient Name: Kathleen Cameron DOB: 1966/01/13 MRN: [id number]   Date of Service  09/14/2020  HPI/Events of Note  Patient with agitation and ventilator dyssynchrony which has persisted for longer than is the norm for her and did not respond to PRN Klonopin and Dilaudid.  eICU Interventions  Versed 2 mg iv x 1 ordere, will check CXR and ABG .        Migdalia Dk 09/14/2020, 10:32 PM

## 2020-09-14 NOTE — Progress Notes (Addendum)
PROGRESS NOTE  Kathleen Cameron KKX:[id number] DOB: December 11, 1965   PCP: Horald Pollen, MD  Patient is from: Home  DOA: 08/29/2020 LOS: 99  Chief complaints: Decreased responsiveness  Brief Narrative / Interim history: 55 year old F with PMH of COPD/hypoxemic and hypercapnic RF on home vent via trach, seizure disorder, dysphagia on TF via G-tube, recurrent hospitalization with respiratory failure in the setting of mucous plugging, seizure disorder, hypothyroidism and anxiety brought to ED with decreased responsiveness and desaturation to 85% on home vent, and admitted with IV fluid on chronic hypoxic and hypercapnic respiratory failure and severe sepsis.  Patient completed empiric antibiotics with IV Zosyn for 7 days.  Hospital course complicated by ileus and G-tube dislodgment that was replaced by IR on 5/25.  Family not interested in SNF or LTAC.  Patient is a stable for discharge home with home health but home health agency can't provide [facility] until 5/31.  Patient needs 24/7 [facility] RN.  Subjective: Seen and examined earlier this morning.  No major events overnight of this morning.  Remains hypertensive but improved.  She has no complaints.  She denies pain, shortness of breath or abdominal pain.  She says she felt a little anxious yesterday afternoon but not anymore.  Objective: Vitals:   09/14/20 0900 09/14/20 1000 09/14/20 1103 09/14/20 1130  BP: (!) 171/97 (!) 156/97    Pulse: 88 87    Resp: 14 (!) 27    Temp:    98.1 F (36.7 C)  TempSrc:    Oral  SpO2: 100% 100% 100%   Weight:      Height:        Intake/Output Summary (Last 24 hours) at 09/14/2020 1158 Last data filed at 09/14/2020 1100 Gross per 24 hour  Intake 1080 ml  Output 3005 ml  Net -1925 ml   Filed Weights   09/12/20 0500 09/13/20 0500 09/14/20 0500  Weight: 56.4 kg 56.2 kg 57 kg    Examination:  GENERAL: No apparent distress.  Nontoxic. HEENT: MMM.  Vision and hearing grossly intact.  NECK:  Tracheostomy.  No apparent JVD. RESP: On MV via trach.  No IWOB.  Fair aeration bilaterally. CVS:  RRR. Heart sounds normal.  ABD/GI/GU: BS+. Abd soft, NTND.  PEG tube in place. MSK/EXT:  Moves extremities. No apparent deformity. No edema.  SKIN: no apparent skin lesion or wound NEURO: Awake and alert.  Seems to be fairly oriented.  No apparent focal neuro deficit. PSYCH: Calm. Normal affect.   Procedures:  5/25-PEG tube replacement by IR  Microbiology summarized: 5/11-COVID-19 and influenza PCR nonreactive 5/11-MRSA PCR screen negative 5/11-blood culture with pansensitive Staph capitis in aerobic bottles 5/12-blood culture negative  Assessment & Plan: Acute on chronic RF with hypoxia and hypercapnia due to tracheobronchitis and mucous plugging Severe chronic COPD with emphysema Severe sepsis due to tracheobronchitis -Completed 7 days of IV Zosyn -PCCM managing vent -Plan for discharge home on home vent once HH RN available  Uncontrolled hypertension: BP as high as 200/100 yesterday afternoon but improving. -Increase amlodipine to 10 mg daily -Add clonidine 0.1 mg twice daily -Continue labetalol as needed -Manage anxiety as below.  Abnormal blood culture: blood culture with pansensitive Staph capitis in aerobic bottles likely contaminant.  Regardless, covered with antibiotic as above  Dysphagia/PEG tube malfunction -PEG tube replaced by IR on 5/25 -TF resumed. -Dysphagia 1 diet per SLP  Ileus/generalized abdominal pain: Seems to have resolved.  Tolerating TF and oral intake. -Continue bowel regimen as needed   Acute urinary  retention: Resolved.  Iron deficiency anemia: H&H relatively stable.  Anemia panel basically normal. Recent Labs    09/07/20 0323 09/07/20 0549 09/07/20 1601 09/08/20 0434 09/09/20 0015 09/10/20 0115 09/11/20 0333 09/12/20 0133 09/12/20 2123 09/13/20 0212  HGB 9.0* 11.6* 10.0* 9.7* 9.5* 8.9* 8.8* 7.3* 8.3* 9.0*  -Monitor  intermittently  Hypothyroidism: Stable -Continue Synthroid  History of seizure disorder: -Not on antiepileptic medications.   History of CVA -Continue home statin  Anxiety/insomnia: Stable -Continue home Effexor, Klonopin and temazepam  Left subclavian DVT after instrumentation, diagnosed 06/12/2020: -Completed 3 months of Eliquis until 5/21.  Moderate malnutrition Body mass index is 23.74 kg/m. Nutrition Problem: Moderate Malnutrition Etiology: chronic illness (COPD, trach on chronic vent support) Signs/Symptoms: mild fat depletion,mild muscle depletion,moderate muscle depletion Interventions: Refer to RD note for recommendations   DVT prophylaxis:  enoxaparin (LOVENOX) injection 40 mg Start: 09/13/20 1700 SCDs Start: 08/29/20 2024 Resume subcu Lovenox per pharmacy.  Code Status: Full code Family Communication: Patient and RN.  None at bedside today. Level of care: ICU Status is: Inpatient  Remains inpatient appropriate because:Unsafe d/c plan.  Patient needs [facility] RN 24/7 that won't be available until 5/31.    Dispo: The patient is from: Home              Anticipated d/c is to: Home              Patient currently is medically stable to d/c.   Difficult to place patient No       Consultants:  PCCM IR   Sch Meds:  Scheduled Meds: . amLODipine  10 mg Per Tube Daily  . arformoterol  15 mcg Nebulization BID  . bisacodyl  10 mg Rectal Q0600  . budesonide  0.25 mg Nebulization BID  . chlorhexidine gluconate (MEDLINE KIT)  15 mL Mouth Rinse BID  . Chlorhexidine Gluconate Cloth  6 each Topical Daily  . clonazePAM  0.5 mg Per Tube TID  . cloNIDine  0.1 mg Per Tube BID  . enoxaparin (LOVENOX) injection  40 mg Subcutaneous Q24H  . feeding supplement (PROSource TF)  45 mL Per Tube BID  . levothyroxine  88 mcg Per Tube Q0600  . mouth rinse  15 mL Mouth Rinse 10 times per day  . metoCLOPramide (REGLAN) injection  10 mg Intravenous Q8H  . multivitamin with  minerals  1 tablet Per Tube Daily  . pantoprazole sodium  40 mg Per Tube Daily  . revefenacin  175 mcg Nebulization Daily  . rosuvastatin  40 mg Per Tube Daily  . simethicone  40 mg Per Tube Q6H  . temazepam  15 mg Per Tube QHS  . venlafaxine  37.5 mg Per Tube BID WC   Continuous Infusions: . sodium chloride Stopped (08/31/20 2128)  . sodium chloride Stopped (08/31/20 1907)  . feeding supplement (OSMOLITE 1.5 CAL) 1,000 mL (09/13/20 2200)   PRN Meds:.sodium chloride, acetaminophen, albuterol, docusate, HYDROmorphone (DILAUDID) injection, labetalol, ondansetron (ZOFRAN) IV, phenol, polyethylene glycol, sodium chloride  Antimicrobials: Anti-infectives (From admission, onward)   Start     Dose/Rate Route Frequency Ordered Stop   08/30/20 1830  vancomycin (VANCOREADY) IVPB 1000 mg/200 mL  Status:  Discontinued        1,000 mg 200 mL/hr over 60 Minutes Intravenous Every 24 hours 08/30/20 0847 08/30/20 1038   08/30/20 1830  vancomycin (VANCOREADY) IVPB 750 mg/150 mL  Status:  Discontinued        750 mg 150 mL/hr over 60 Minutes Intravenous Every 24  hours 08/30/20 1038 09/01/20 1322   08/30/20 0600  vancomycin (VANCOREADY) IVPB 750 mg/150 mL  Status:  Discontinued        750 mg 150 mL/hr over 60 Minutes Intravenous Every 12 hours 08/29/20 1822 08/30/20 0847   08/30/20 0400  ceFEPIme (MAXIPIME) 2 g in sodium chloride 0.9 % 100 mL IVPB  Status:  Discontinued        2 g 200 mL/hr over 30 Minutes Intravenous Every 8 hours 08/29/20 1822 08/29/20 2038   08/29/20 2200  piperacillin-tazobactam (ZOSYN) IVPB 3.375 g        3.375 g 12.5 mL/hr over 240 Minutes Intravenous Every 8 hours 08/29/20 2038 09/05/20 0140   08/29/20 2030  piperacillin-tazobactam (ZOSYN) IVPB 3.375 g  Status:  Discontinued        3.375 g 100 mL/hr over 30 Minutes Intravenous Every 6 hours 08/29/20 2021 08/29/20 2038   08/29/20 1715  ceFEPIme (MAXIPIME) 2 g in sodium chloride 0.9 % 100 mL IVPB  Status:  Discontinued        2  g 200 mL/hr over 30 Minutes Intravenous  Once 08/29/20 1700 08/29/20 2033   08/29/20 1715  metroNIDAZOLE (FLAGYL) IVPB 500 mg        500 mg 100 mL/hr over 60 Minutes Intravenous  Once 08/29/20 1700 08/29/20 1824   08/29/20 1715  vancomycin (VANCOREADY) IVPB 1250 mg/250 mL        1,250 mg 166.7 mL/hr over 90 Minutes Intravenous  Once 08/29/20 1700 08/29/20 2028       I have personally reviewed the following labs and images: CBC: Recent Labs  Lab 09/09/20 0015 09/10/20 0115 09/11/20 0333 09/12/20 0133 09/12/20 2123 09/13/20 0212  WBC 10.1 10.7* 7.4 7.0  --  10.2  HGB 9.5* 8.9* 8.8* 7.3* 8.3* 9.0*  HCT 31.6* 29.6* 28.6* 24.1* 27.3* 29.4*  MCV 86.3 87.1 85.4 85.5  --  84.7  PLT 272 254 184 260  --  233   BMP &GFR Recent Labs  Lab 09/09/20 0015 09/10/20 0115 09/11/20 0333 09/12/20 0133 09/13/20 0212  NA 136 135 138 136 137  K 4.5 4.9 4.3 3.7 4.2  CL 94* 94* 97* 96* 97*  CO2 35* 34* 32 33* 30  GLUCOSE 124* 82 81 105* 99  BUN 37* 28* _0 CREATININE 1.09* 0.74 0.69 0.71 1.03*  CALCIUM 9.6 9.5 9.7 9.7 9.7  MG 2.0 1.9 1.9 1.9 1.8  PHOS 2.5 2.7 3.3  --  4.6   Estimated Creatinine Clearance: 45.5 mL/min (A) (by C-G formula based on SCr of 1.03 mg/dL (H)). Liver & Pancreas: Recent Labs  Lab 09/13/20 0212  ALBUMIN 2.5*   No results for input(s): LIPASE, AMYLASE in the last 168 hours. No results for input(s): AMMONIA in the last 168 hours. Diabetic: No results for input(s): HGBA1C in the last 72 hours. Recent Labs  Lab 09/13/20 2005 09/13/20 2328 09/14/20 0324 09/14/20 0810 09/14/20 1129  GLUCAP 149* 150* 136* 146* 173*   Cardiac Enzymes: No results for input(s): CKTOTAL, CKMB, CKMBINDEX, TROPONINI in the last 168 hours. No results for input(s): PROBNP in the last 8760 hours. Coagulation Profile: No results for input(s): INR, PROTIME in the last 168 hours. Thyroid Function Tests: No results for input(s): TSH, T4TOTAL, FREET4, T3FREE, THYROIDAB in the  last 72 hours. Lipid Profile: No results for input(s): CHOL, HDL, LDLCALC, TRIG, CHOLHDL, LDLDIRECT in the last 72 hours. Anemia Panel: Recent Labs    09/12/20 1617 09/12/20 2123  VITAMINB12  1,490*  --   FOLATE 36.2  --   FERRITIN 129  --   TIBC 365  --   IRON 83  --   RETICCTPCT  --  2.7   Urine analysis:    Component Value Date/Time   COLORURINE YELLOW 08/29/2020 1645   APPEARANCEUR HAZY (A) 08/29/2020 1645   LABSPEC 1.009 08/29/2020 1645   PHURINE 8.0 08/29/2020 1645   GLUCOSEU 50 (A) 08/29/2020 1645   HGBUR NEGATIVE 08/29/2020 [place] 08/29/2020 [place] 08/29/2020 1645   PROTEINUR 100 (A) 08/29/2020 1645   UROBILINOGEN 1.0 11/12/2008 1440   NITRITE NEGATIVE 08/29/2020 1645   LEUKOCYTESUR NEGATIVE 08/29/2020 1645   Sepsis Labs: Invalid input(s): PROCALCITONIN, [place]  Microbiology: No results found for this or any previous visit (from the past 240 hour(s)).  Radiology Studies: No results found.    Jaquelyne Firkus T. [place]el[place]  If 7PM-7AM, please contact night-coverage [email or web address] 09/14/2020, 11:58 AM

## 2020-09-14 NOTE — Progress Notes (Signed)
  Speech Language Pathology Treatment: Dysphagia  Patient Details Name: Kathleen Cameron MRN: [id number] DOB: [birth year] Today's Date: 09/14/2020 Time: 1012-1027 SLP Time Calculation (min) (ACUTE ONLY): 15 min  Assessment / Plan / Recommendation Clinical Impression  Pt seen with am meal that her and her husband ordered. Pt demosntated severe left pocketing, ability to fully masticate solids and poor response to cueing. Husband reports she has always chewed on the left with prolonged mastication, but pt appears more lethargic today. She is not clearing bolus with max cues. SLP provided trials of puree with improvement, but pt still needed extra time and liquid wash. Mentation appears to be impacting her ability to attend to bolus. Hopeful that this will improve as husband reports it does usually. For now, pt may continue liquids and purees.   HPI HPI: 55 year old female admitted with recurrent mucous plugging, severe sepsis, chronic respiratory failure with trach, home vent. Apparently had an episode of desaturation at 85% on home vent.  She has had multiple recent hospitalizations for mucous plugging events resulting in respiratory arrest; followed by pulmonology as an outpatient in the trach clinic. Last seen 427 for trach change.   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 FEES 06/15/20 revealed mild oropharyngeal dysphagia specific to pharyngeal residue when inline PMV was not in place.  Without speaking valve, there was transient penetration of thin liquids on initial trial only, but no further penetration and no aspiration. Regular diet and thin liquids was recommended.      SLP Plan  Continue with current plan of care       Recommendations  Diet recommendations: Dysphagia 1 (puree);Thin liquid Liquids provided via: Cup;Straw Medication Administration: Via alternative means Supervision: Patient able to self feed;Full supervision/cueing  for compensatory strategies Compensations: Slow rate;Small sips/bites;Lingual sweep for clearance of pocketing;Monitor for anterior loss;Follow solids with liquid                Oral Care Recommendations: Oral care BID Follow up Recommendations: Home health SLP Plan: Continue with current plan of care       GO                Delmus Warwick, Riley Nearing 09/14/2020, 3:10 PM

## 2020-09-14 NOTE — Progress Notes (Signed)
NAME:  Kathleen Cameron, MRN:  [id number], DOB:  July 04, 1965, LOS: 16 ADMISSION DATE:  08/29/2020, CONSULTATION DATE:  08/29/2020 REFERRING MD:  Dr. Allena Katz CHIEF COMPLAINT:  SOB  History of Present Illness:  55 yo female presented to ED with dyspnea and hypoxia with SpO2 85%.  She had fever and found to have mucus plugging.  Started on antibiotics.  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  She is followed in trach clinic [name].  She has hx of respiratory arrest from mucus plugging in the setting of Corynebacterium pneumonia.  Pertinent Medical History:  Anxiety, HTN, Seizures  Significant Hospital Events:  . 5/11 Increasing SOB, presented to [facility] ED, code sepsis called, Pip-tazo>, Vanc> flagyl x1, head ct neg, CT abd pelvis grossly unremarkable, UC> , BC> . 5/12 Anxious overnight, improved with Xanax.  . 5/15 surgery consulted for abdominal pain . 5/16 GI consulted . 5/20 anxiety driven worsening respiratory mechanics; hypotension, mild AKI . 5/21 better anxiety control . 5/23 PEG fell out . 5/25 PEG replaced  Tests:   CT head 5/11 >> atrophy and small vessel ischemic changes  CT abd/pelvis 5/11 >> distended stomach  CT abd/pelvis 5/15 >> no acute findings  Interim History / Subjective:  Awake and answers questions. Feeling well. No complaints.   Objective   Blood pressure (!) 182/117, pulse 97, temperature 97.7 F (36.5 C), temperature source Oral, resp. rate 17, height 5\' 1"  (1.549 m), weight 57 kg, SpO2 100 %.    Vent Mode: PRVC FiO2 (%):  [30 %-40 %] 40 % Set Rate:  [16 bmp-20 bmp] 20 bmp Vt Set:  [420 mL-480 mL] 420 mL PEEP:  [5 cmH20] 5 cmH20 Plateau Pressure:  [16 cmH20-27 cmH20] 27 cmH20   Intake/Output Summary (Last 24 hours) at 09/14/2020 09/16/2020 Last data filed at 09/14/2020 0700 Gross per 24 hour  Intake 1210 ml  Output 2655 ml  Net -1445 ml   Filed Weights   09/12/20 0500 09/13/20 0500 09/14/20 0500   Weight: 56.4 kg 56.2 kg 57 kg   Physical Examination:   Gen:      No acute distress HEENT:  sclera anicteric Neck:      Trach in place Lungs:    Trach in place. On ventilator. lungs are clear to auscultation bilaterally;  CV:         Regular rate and rhythm; no murmurs Abd:      + bowel sounds; abdomen distended but soft, non-tender. PEG in place and functioning Ext:    No edema; adequate peripheral perfusion Skin:      Warm and dry; no rash Neuro:   A and oriented x3  Resolved Hospital Problem list   AKI Hypotension  Assessment & Plan:   Acute on chronic hypoxic respiratory failure from tracheobronchitis, mucus plugging Completed abx 5/17 Chronic vent/trach Severe COPD with emphysema, vent dependent  -Improved. Ready to be discharged. Patient's family did not want SNF and patient will be DC home when [facility] serivice available (on Tuesday) - Assist control ventilation - Routine trach care - Needs OP follow up (Dr. Sunday in office, [facility])  Anxiety disorder with panic attacks Stable today -Causes de-recruitment, severe hypertension, and ventilator dyssynchrony, last occurred 5/20 -PRN colazepam  Ileus: improving  PEG malfunction: PEG replaced by IR yesterday 5/25 -Continue PEG care and continue reglan and suppositories so that has 1BM every day    HTN: BP has been up overnight and  this AM. -Amlodipine increased to 10 mg and clonidine 0.1 mg BID added by primary team  Hypothyroidism -Continue synthroid  Hx Sz disorder - No event during this hospitalization. - controlled with benzos  Left subclavian DVT (06/12/2020) - Lovenox (completed 54mo eliquis)   Recurrent admissions-  -Complicated chronic medical issues at a fairly high acuity with home vent.  -she and husband are adamant against SNF and LTACH; unfortunately this means recurrent admissions as her baseline clinical status cannot be managed at home. Multiple PCCM and palliative providers have  discussed this with family without much progress.  Tough situation, continue discussions.  -Plan is discharging to home likely on Tuesday (after holiday) with [facility]   Best practice:  Diet: PO diet DVT prophylaxis: lovenox GI prophylaxis: Protonix Mobility: as tolerated Code Status:  full code Disposition: DC home today

## 2020-09-15 ENCOUNTER — Inpatient Hospital Stay (HOSPITAL_COMMUNITY): Payer: Medicaid Other

## 2020-09-15 DIAGNOSIS — D509 Iron deficiency anemia, unspecified: Secondary | ICD-10-CM | POA: Diagnosis not present

## 2020-09-15 DIAGNOSIS — E440 Moderate protein-calorie malnutrition: Secondary | ICD-10-CM | POA: Diagnosis not present

## 2020-09-15 DIAGNOSIS — A419 Sepsis, unspecified organism: Secondary | ICD-10-CM | POA: Diagnosis not present

## 2020-09-15 DIAGNOSIS — J9621 Acute and chronic respiratory failure with hypoxia: Secondary | ICD-10-CM | POA: Diagnosis not present

## 2020-09-15 DIAGNOSIS — R1312 Dysphagia, oropharyngeal phase: Secondary | ICD-10-CM | POA: Diagnosis not present

## 2020-09-15 DIAGNOSIS — R6521 Severe sepsis with septic shock: Secondary | ICD-10-CM | POA: Diagnosis not present

## 2020-09-15 LAB — CBC
HCT: 28.4 % — ABNORMAL LOW (ref 36.0–46.0)
Hemoglobin: 8.3 g/dL — ABNORMAL LOW (ref 12.0–15.0)
MCH: 25.5 pg — ABNORMAL LOW (ref 26.0–34.0)
MCHC: 29.2 g/dL — ABNORMAL LOW (ref 30.0–36.0)
MCV: 87.4 fL (ref 80.0–100.0)
Platelets: 276 10*3/uL (ref 150–400)
RBC: 3.25 MIL/uL — ABNORMAL LOW (ref 3.87–5.11)
RDW: 16.6 % — ABNORMAL HIGH (ref 11.5–15.5)
WBC: 9.1 10*3/uL (ref 4.0–10.5)
nRBC: 0 % (ref 0.0–0.2)

## 2020-09-15 LAB — POCT I-STAT 7, (LYTES, BLD GAS, ICA,H+H)
Acid-Base Excess: 17 mmol/L — ABNORMAL HIGH (ref 0.0–2.0)
Bicarbonate: 43.4 mmol/L — ABNORMAL HIGH (ref 20.0–28.0)
Calcium, Ion: 1.36 mmol/L (ref 1.15–1.40)
HCT: 27 % — ABNORMAL LOW (ref 36.0–46.0)
Hemoglobin: 9.2 g/dL — ABNORMAL LOW (ref 12.0–15.0)
O2 Saturation: 97 %
Patient temperature: 98.4
Potassium: 3.8 mmol/L (ref 3.5–5.1)
Sodium: 140 mmol/L (ref 135–145)
TCO2: 45 mmol/L — ABNORMAL HIGH (ref 22–32)
pCO2 arterial: 65.3 mmHg (ref 32.0–48.0)
pH, Arterial: 7.43 (ref 7.350–7.450)
pO2, Arterial: 89 mmHg (ref 83.0–108.0)

## 2020-09-15 LAB — RENAL FUNCTION PANEL
Albumin: 2.6 g/dL — ABNORMAL LOW (ref 3.5–5.0)
Anion gap: 10 (ref 5–15)
BUN: 13 mg/dL (ref 6–20)
CO2: 36 mmol/L — ABNORMAL HIGH (ref 22–32)
Calcium: 10.1 mg/dL (ref 8.9–10.3)
Chloride: 95 mmol/L — ABNORMAL LOW (ref 98–111)
Creatinine, Ser: 0.67 mg/dL (ref 0.44–1.00)
GFR, Estimated: >60 mL/min (ref >60)
Glucose, Bld: 130 mg/dL — ABNORMAL HIGH (ref 70–99)
Phosphorus: 2.6 mg/dL (ref 2.5–4.6)
Potassium: 3.9 mmol/L (ref 3.5–5.1)
Sodium: 141 mmol/L (ref 135–145)

## 2020-09-15 LAB — GLUCOSE, CAPILLARY
Glucose-Capillary: 107 mg/dL — ABNORMAL HIGH (ref 70–99)
Glucose-Capillary: 137 mg/dL — ABNORMAL HIGH (ref 70–99)
Glucose-Capillary: 161 mg/dL — ABNORMAL HIGH (ref 70–99)
Glucose-Capillary: 138 mg/dL — ABNORMAL HIGH (ref 70–99)
Glucose-Capillary: 166 mg/dL — ABNORMAL HIGH (ref 70–99)
Glucose-Capillary: 173 mg/dL — ABNORMAL HIGH (ref 70–99)

## 2020-09-15 LAB — MAGNESIUM: Magnesium: 2 mg/dL (ref 1.7–2.4)

## 2020-09-15 NOTE — Progress Notes (Signed)
NAME:  Kathleen Cameron, MRN:  [id number], DOB:  August 07, 1965, LOS: 17 ADMISSION DATE:  08/29/2020, CONSULTATION DATE:  08/29/2020 REFERRING MD:  Dr. Allena Katz CHIEF COMPLAINT:  SOB  History of Present Illness:  55 yo female presented to ED with dyspnea and hypoxia with SpO2 85%.  She had fever and found to have mucus plugging.  Started on antibiotics.  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  She is followed in trach clinic [name].  She has hx of respiratory arrest from mucus plugging in the setting of Corynebacterium pneumonia.  Pertinent Medical History:  Anxiety, HTN, Seizures  Significant Hospital Events:  . 5/11 Increasing SOB, presented to [facility] ED, code sepsis called, Pip-tazo>, Vanc> flagyl x1, head ct neg, CT abd pelvis grossly unremarkable, BC> no growth for 5 days . UC + for pseudomonas . 5/12 Anxious overnight, improved with Xanax.  . 5/15 surgery consulted for abdominal pain . 5/16 GI consulted . 5/20 anxiety driven worsening respiratory mechanics; hypotension, mild AKI . 5/21 better anxiety control . 5/23 PEG fell out . 5/25 PEG replaced  Tests:   CT head 5/11 >> atrophy and small vessel ischemic changes  CT abd/pelvis 5/11 >> distended stomach  CT abd/pelvis 5/15 >> no acute findings  Interim History / Subjective:  Awake and answers questions Does not feel comfortable. Denies pain.   Had agitation and vent dyssynchrony overnight. Received Versed.  Objective   Blood pressure 126/80, pulse 92, temperature 98.4 F (36.9 C), temperature source Axillary, resp. rate 19, height 5\' 1"  (1.549 m), weight 57.2 kg, SpO2 100 %.    Vent Mode: PCV FiO2 (%):  [40 %] 40 % Set Rate:  [16 bmp-20 bmp] 16 bmp Vt Set:  [420 mL] 420 mL PEEP:  [5 cmH20] 5 cmH20 Plateau Pressure:  [26 cmH20-30 cmH20] 30 cmH20   Intake/Output Summary (Last 24 hours) at 09/15/2020 0713 Last data filed at 09/15/2020 0600 Gross per 24 hour  Intake  1100 ml  Output 1400 ml  Net -300 ml   Filed Weights   09/13/20 0500 09/14/20 0500 09/15/20 0410  Weight: 56.2 kg 57 kg 57.2 kg   Physical Examination:   Gen:       Mildly anxious and uncomfortable HEENT:  sclera anicteric Neck:      Trach in place Lungs:    On ventilator. lungs are clear to auscultation bilaterally;  CV:         Regular rate and rhythm; no murmurs Abd:      Abdomen mildly distended but soft and non-tender. PEG in place Ext:    No edema Skin:       Warm and dry Neuro:    Alert and oriented x 3 Psych:    Slightly anxcious  Resolved Hospital Problem list   AKI Hypotension  Assessment & Plan:   Acute on chronic hypoxic respiratory failure from tracheobronchitis, mucus plugging Completed abx 5/17 Chronic vent/trach Severe COPD with emphysema, vent dependent -Improved in overal but had vent dyssynchrony overnight. ABG unremarkable. Will keep on current vent management and will monitor. Might be secondary to mucus plug and also some anxiety. Appreciative raspatory team follow.  - Patient's family did not want SNF and patient will be DC home when [facility] serivice available (on Tuesday 5/31) - Assist control ventilation - Routine trach care - Needs OP follow up (Dr. 09-14-1981 in office, [facility])  Anxiety disorder with panic attacks Slightly anxious today  -  PRN colazepam  Ileus: improving  PEG malfunction: PEG replaced by IR yesterday 5/25 -PEG functioning well now -Continue PEG care and continue reglan and suppositories so that has 1BM every day   HTN: -On amlodipine10 mg. clonidine 0.1 mg BID added by primary team yesterday. BP improved.  Hypothyroidism -Continue synthroid  Hx Sz disorder - No event during this hospitalization. - Controlled with benzos  Left subclavian DVT (06/12/2020) - Lovenox (completed 56mo eliquis)   Recurrent admissions -Complicated chronic medical issues at a fairly high acuity with home vent.  -she and husband are  adamant against SNF and LTACH; unfortunately this means recurrent admissions as her baseline clinical status cannot be managed at home. Multiple PCCM and palliative providers have discussed this with family without much progress. Tough situation, continue discussions.  -Plan is discharging to home likely on Tuesday (after holiday) with [facility]   Best practice:  Diet: PO diet DVT prophylaxis: lovenox GI prophylaxis: Protonix Mobility: as tolerated Code Status:  full code Disposition: DC home likely 5/31 (when [facility] ready for her)

## 2020-09-15 NOTE — Progress Notes (Signed)
PROGRESS NOTE  Kathleen Cameron PPI:[id number] DOB: 1966-03-22   PCP: Horald Pollen, MD  Patient is from: Home  DOA: 08/29/2020 LOS: 21  Chief complaints: Decreased responsiveness  Brief Narrative / Interim history: 54 year old F with PMH of COPD/hypoxemic and hypercapnic RF on home vent via trach, seizure disorder, dysphagia on TF via G-tube, recurrent hospitalization with respiratory failure in the setting of mucous plugging, seizure disorder, hypothyroidism and anxiety brought to ED with decreased responsiveness and desaturation to 85% on home vent, and admitted with IV fluid on chronic hypoxic and hypercapnic respiratory failure and severe sepsis.  Patient completed empiric antibiotics with IV Zosyn for 7 days.  Hospital course complicated by ileus and G-tube dislodgment that was replaced by IR on 5/25.  Family not interested in SNF or LTAC.  Patient is a stable for discharge home with home health but home health agency can't provide [facility] until 5/31.  Patient needs 24/7 [facility] RN.  Subjective: Seen and examined earlier this morning.  Patient had an episode of agitation and dyssynchrony with ventilator overnight.  She was given IV Versed x1.  CXR without acute finding.  ABG reassuring.  She was seen by PCCM who troubleshoot the vent/mucus plug.  She now complains abdominal pain.  She reports passing gas and having bowel movements.  She denies nausea or vomiting.  There is difficult to get the details of her pain as she can't vocalize.   Objective: Vitals:   09/15/20 0700 09/15/20 0734 09/15/20 1012 09/15/20 1121  BP:  130/88 (!) 164/93 (!) 179/95  Pulse:      Resp:      Temp: 98.6 F (37 C)     TempSrc: Oral     SpO2:  100%  100%  Weight:      Height:        Intake/Output Summary (Last 24 hours) at 09/15/2020 1216 Last data filed at 09/15/2020 0600 Gross per 24 hour  Intake 850 ml  Output 300 ml  Net 550 ml   Filed Weights   09/13/20 0500 09/14/20 0500 09/15/20 0410   Weight: 56.2 kg 57 kg 57.2 kg    Examination:  GENERAL: No apparent distress.  Nontoxic. HEENT: MMM.  Vision and hearing grossly intact.  NECK: Tracheostomy in place.  No apparent JVD. RESP: On PS via trach.  No IWOB.  Fair aeration bilaterally. CVS:  RRR. Heart sounds normal.  ABD/GI/GU: BS+. Abd slightly distended.  PEG tube in place. MSK/EXT:  Moves extremities. No apparent deformity. No edema.  SKIN: no apparent skin lesion or wound NEURO: Awake and alert.  Seems to be oriented fairly.  No apparent focal neuro deficit. PSYCH: Calm.  No distress or agitation.  Procedures:  5/25-PEG tube replacement by IR  Microbiology summarized: 5/11-COVID-19 and influenza PCR nonreactive 5/11-MRSA PCR screen negative 5/11-blood culture with pansensitive Staph capitis in aerobic bottles 5/12-blood culture negative  Assessment & Plan: Acute on chronic RF with hypoxia and hypercapnia due to tracheobronchitis and mucous plugging Severe chronic COPD with emphysema Severe sepsis due to tracheobronchitis -Completed 7 days of IV Zosyn -PCCM managing vent -Plan for discharge home on home vent once [facility] RN available  Uncontrolled hypertension: BP fluctuating probably due to agitation. -Continue amlodipine 10 mg daily -Add clonidine 0.1 mg 3 times daily on 5/27 -Continue labetalol as needed -Manage anxiety as below.  Abnormal blood culture: blood culture with pansensitive Staph capitis in aerobic bottles likely contaminant.  Regardless, covered with antibiotic as above  Dysphagia/PEG tube malfunction -PEG  tube replaced by IR on 5/25 -TF resumed. -Dysphagia 1 diet per SLP  Ileus/generalized abdominal pain: Abdomen slightly distended today.  Passing gas and having bowel movements -Continue bowel regimen as needed -Check KUB   Acute urinary retention: Resolved.  Iron deficiency anemia: H&H relatively stable.  Anemia panel basically normal. Recent Labs    09/08/20 0434 09/09/20 0015  09/10/20 0115 09/11/20 0333 09/12/20 0133 09/12/20 2123 09/13/20 0212 09/14/20 2325 09/15/20 0147 09/15/20 0334  HGB 9.7* 9.5* 8.9* 8.8* 7.3* 8.3* 9.0* 9.9* 8.3* 9.2*  -Monitor intermittently  Hypothyroidism: Stable -Continue Synthroid  History of seizure disorder: -Not on antiepileptic medications.   History of CVA -Continue home statin  Anxiety/insomnia/agitation: she had an episode of agitation which was thought to be due to mucous plugging.  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  Moderate malnutrition Body mass index is 23.83 kg/m. Nutrition Problem: Moderate Malnutrition Etiology: chronic illness (COPD, trach on chronic vent support) Signs/Symptoms: mild fat depletion,mild muscle depletion,moderate muscle depletion Interventions: Refer to RD note for recommendations   DVT prophylaxis:  enoxaparin (LOVENOX) injection 40 mg Start: 09/13/20 1700 SCDs Start: 08/29/20 2024  Code Status: Full code Family Communication: Patient and RN.  Attempted to call patient's husband for update but no answer. Level of care: ICU Status is: Inpatient  Remains inpatient appropriate because:Unsafe d/c plan.  Patient needs [facility] RN 24/7 that won't be available until 5/31.    Dispo: The patient is from: Home              Anticipated d/c is to: Home              Patient currently is medically stable to d/c.   Difficult to place patient No       Consultants:  PCCM IR   Sch Meds:  Scheduled Meds: . amLODipine  10 mg Per Tube Daily  . arformoterol  15 mcg Nebulization BID  . bisacodyl  10 mg Rectal Q0600  . budesonide  0.25 mg Nebulization BID  . chlorhexidine gluconate (MEDLINE KIT)  15 mL Mouth Rinse BID  . Chlorhexidine Gluconate Cloth  6 each Topical Daily  . clonazePAM  0.5 mg Per Tube  TID  . cloNIDine  0.1 mg Per Tube TID  . enoxaparin (LOVENOX) injection  40 mg Subcutaneous Q24H  . feeding supplement (PROSource TF)  45 mL Per Tube BID  . levothyroxine  88 mcg Per Tube Q0600  . mouth rinse  15 mL Mouth Rinse 10 times per day  . metoCLOPramide (REGLAN) injection  10 mg Intravenous Q8H  . multivitamin with minerals  1 tablet Per Tube Daily  . pantoprazole sodium  40 mg Per Tube Daily  . revefenacin  175 mcg Nebulization Daily  . rosuvastatin  40 mg Per Tube Daily  . simethicone  40 mg Per Tube Q6H  . temazepam  15 mg Per Tube QHS  . venlafaxine  37.5 mg Per Tube BID WC   Continuous Infusions: . sodium chloride Stopped (08/31/20 2128)  . sodium chloride Stopped (08/31/20 1907)  . feeding supplement (OSMOLITE 1.5 CAL) 50 mL/hr at 09/15/20 0600   PRN Meds:.sodium chloride, acetaminophen, albuterol, docusate, HYDROmorphone (DILAUDID) injection, labetalol, ondansetron (ZOFRAN) IV, phenol, polyethylene glycol, sodium chloride  Antimicrobials: Anti-infectives (From admission, onward)   Start     Dose/Rate Route Frequency Ordered Stop  08/30/20 1830  vancomycin (VANCOREADY) IVPB 1000 mg/200 mL  Status:  Discontinued        1,000 mg 200 mL/hr over 60 Minutes Intravenous Every 24 hours 08/30/20 0847 08/30/20 1038   08/30/20 1830  vancomycin (VANCOREADY) IVPB 750 mg/150 mL  Status:  Discontinued        750 mg 150 mL/hr over 60 Minutes Intravenous Every 24 hours 08/30/20 1038 09/01/20 1322   08/30/20 0600  vancomycin (VANCOREADY) IVPB 750 mg/150 mL  Status:  Discontinued        750 mg 150 mL/hr over 60 Minutes Intravenous Every 12 hours 08/29/20 1822 08/30/20 0847   08/30/20 0400  ceFEPIme (MAXIPIME) 2 g in sodium chloride 0.9 % 100 mL IVPB  Status:  Discontinued        2 g 200 mL/hr over 30 Minutes Intravenous Every 8 hours 08/29/20 1822 08/29/20 2038   08/29/20 2200  piperacillin-tazobactam (ZOSYN) IVPB 3.375 g        3.375 g 12.5 mL/hr over 240 Minutes Intravenous  Every 8 hours 08/29/20 2038 09/05/20 0140   08/29/20 2030  piperacillin-tazobactam (ZOSYN) IVPB 3.375 g  Status:  Discontinued        3.375 g 100 mL/hr over 30 Minutes Intravenous Every 6 hours 08/29/20 2021 08/29/20 2038   08/29/20 1715  ceFEPIme (MAXIPIME) 2 g in sodium chloride 0.9 % 100 mL IVPB  Status:  Discontinued        2 g 200 mL/hr over 30 Minutes Intravenous  Once 08/29/20 1700 08/29/20 2033   08/29/20 1715  metroNIDAZOLE (FLAGYL) IVPB 500 mg        500 mg 100 mL/hr over 60 Minutes Intravenous  Once 08/29/20 1700 08/29/20 1824   08/29/20 1715  vancomycin (VANCOREADY) IVPB 1250 mg/250 mL        1,250 mg 166.7 mL/hr over 90 Minutes Intravenous  Once 08/29/20 1700 08/29/20 2028       I have personally reviewed the following labs and images: CBC: Recent Labs  Lab 09/10/20 0115 09/11/20 0333 09/12/20 0133 09/12/20 2123 09/13/20 0212 09/14/20 2325 09/15/20 0147 09/15/20 0334  WBC 10.7* 7.4 7.0  --  10.2  --  9.1  --   HGB 8.9* 8.8* 7.3* 8.3* 9.0* 9.9* 8.3* 9.2*  HCT 29.6* 28.6* 24.1* 27.3* 29.4* 29.0* 28.4* 27.0*  MCV 87.1 85.4 85.5  --  84.7  --  87.4  --   PLT 254 184 260  --  233  --  276  --    BMP &GFR Recent Labs  Lab 09/09/20 0015 09/10/20 0115 09/11/20 0333 09/12/20 0133 09/13/20 0212 09/14/20 2325 09/15/20 0147 09/15/20 0201 09/15/20 0334  NA 136 135 138 136 137 140  --  141 140  K 4.5 4.9 4.3 3.7 4.2 3.9  --  3.9 3.8  CL 94* 94* 97* 96* 97*  --   --  95*  --   CO2 35* 34* 32 33* 30  --   --  36*  --   GLUCOSE 124* 82 81 105* 99  --   --  130*  --   BUN 37* 28* _0 --   --  13  --   CREATININE 1.09* 0.74 0.69 0.71 1.03*  --   --  0.67  --   CALCIUM 9.6 9.5 9.7 9.7 9.7  --   --  10.1  --   MG 2.0 1.9 1.9 1.9 1.8  --  2.0  --   --  PHOS 2.5 2.7 3.3  --  4.6  --   --  2.6  --    Estimated Creatinine Clearance: 58.5 mL/min (by C-G formula based on SCr of 0.67 mg/dL). Liver & Pancreas: Recent Labs  Lab 09/13/20 0212 09/15/20 0201   ALBUMIN 2.5* 2.6*   No results for input(s): LIPASE, AMYLASE in the last 168 hours. No results for input(s): AMMONIA in the last 168 hours. Diabetic: No results for input(s): HGBA1C in the last 72 hours. Recent Labs  Lab 09/14/20 1518 09/14/20 1929 09/14/20 2343 09/15/20 0321 09/15/20 0720  GLUCAP 135* 124* 141* 107* 137*   Cardiac Enzymes: No results for input(s): CKTOTAL, CKMB, CKMBINDEX, TROPONINI in the last 168 hours. No results for input(s): PROBNP in the last 8760 hours. Coagulation Profile: No results for input(s): INR, PROTIME in the last 168 hours. Thyroid Function Tests: No results for input(s): TSH, T4TOTAL, FREET4, T3FREE, THYROIDAB in the last 72 hours. Lipid Profile: No results for input(s): CHOL, HDL, LDLCALC, TRIG, CHOLHDL, LDLDIRECT in the last 72 hours. Anemia Panel: Recent Labs    09/12/20 1617 09/12/20 2123  VITAMINB12 1,490*  --   FOLATE 36.2  --   FERRITIN 129  --   TIBC 365  --   IRON 83  --   RETICCTPCT  --  2.7   Urine analysis:    Component Value Date/Time   COLORURINE YELLOW 08/29/2020 1645   APPEARANCEUR HAZY (A) 08/29/2020 1645   LABSPEC 1.009 08/29/2020 1645   PHURINE 8.0 08/29/2020 1645   GLUCOSEU 50 (A) 08/29/2020 1645   HGBUR NEGATIVE 08/29/2020 [place] 08/29/2020 [place] 08/29/2020 1645   PROTEINUR 100 (A) 08/29/2020 1645   UROBILINOGEN 1.0 11/12/2008 1440   NITRITE NEGATIVE 08/29/2020 1645   LEUKOCYTESUR NEGATIVE 08/29/2020 1645   Sepsis Labs: Invalid input(s): PROCALCITONIN, [place]  Microbiology: No results found for this or any previous visit (from the past 240 hour(s)).  Radiology Studies: DG Chest Port 1 View  Result Date: 09/14/2020 CLINICAL DATA:  Acute respiratory failure EXAM: PORTABLE CHEST 1 VIEW COMPARISON:  09/07/2020 FINDINGS: Single frontal view of the chest demonstrates stable tracheostomy tube. Cardiac silhouette is unremarkable. Stable background emphysema  with right basilar scarring. No acute airspace disease, effusion, or pneumothorax. No acute bony abnormalities. IMPRESSION: 1. Stable exam, no acute process. Electronically Signed   By: Randa Ngo M.D.   On: 09/14/2020 23:46      Sonnie Pawloski T. [place]  If 7PM-7AM, please contact night-coverage [email or web address] 09/15/2020, 12:16 PM

## 2020-09-16 DIAGNOSIS — D509 Iron deficiency anemia, unspecified: Secondary | ICD-10-CM | POA: Diagnosis not present

## 2020-09-16 DIAGNOSIS — R1312 Dysphagia, oropharyngeal phase: Secondary | ICD-10-CM | POA: Diagnosis not present

## 2020-09-16 DIAGNOSIS — E440 Moderate protein-calorie malnutrition: Secondary | ICD-10-CM | POA: Diagnosis not present

## 2020-09-16 DIAGNOSIS — J9621 Acute and chronic respiratory failure with hypoxia: Secondary | ICD-10-CM | POA: Diagnosis not present

## 2020-09-16 LAB — POCT I-STAT 7, (LYTES, BLD GAS, ICA,H+H)
Acid-Base Excess: 19 mmol/L — ABNORMAL HIGH (ref 0.0–2.0)
Bicarbonate: 43.6 mmol/L — ABNORMAL HIGH (ref 20.0–28.0)
Calcium, Ion: 1.23 mmol/L (ref 1.15–1.40)
HCT: 21 % — ABNORMAL LOW (ref 36.0–46.0)
Hemoglobin: 7.1 g/dL — ABNORMAL LOW (ref 12.0–15.0)
O2 Saturation: 99 %
Patient temperature: 98.1
Potassium: 4.7 mmol/L (ref 3.5–5.1)
Sodium: 139 mmol/L (ref 135–145)
TCO2: 45 mmol/L — ABNORMAL HIGH (ref 22–32)
pCO2 arterial: 54.4 mmHg — ABNORMAL HIGH (ref 32.0–48.0)
pH, Arterial: 7.511 — ABNORMAL HIGH (ref 7.350–7.450)
pO2, Arterial: 139 mmHg — ABNORMAL HIGH (ref 83.0–108.0)

## 2020-09-16 LAB — COMPREHENSIVE METABOLIC PANEL
ALT: 15 U/L (ref 0–44)
AST: 28 U/L (ref 15–41)
Albumin: 2.3 g/dL — ABNORMAL LOW (ref 3.5–5.0)
Alkaline Phosphatase: 66 U/L (ref 38–126)
Anion gap: 6 (ref 5–15)
BUN: 24 mg/dL — ABNORMAL HIGH (ref 6–20)
CO2: 39 mmol/L — ABNORMAL HIGH (ref 22–32)
Calcium: 9.7 mg/dL (ref 8.9–10.3)
Chloride: 94 mmol/L — ABNORMAL LOW (ref 98–111)
Creatinine, Ser: 1.11 mg/dL — ABNORMAL HIGH (ref 0.44–1.00)
GFR, Estimated: 58 mL/min — ABNORMAL LOW (ref >60)
Glucose, Bld: 157 mg/dL — ABNORMAL HIGH (ref 70–99)
Potassium: 4.6 mmol/L (ref 3.5–5.1)
Sodium: 139 mmol/L (ref 135–145)
Total Bilirubin: 0.4 mg/dL (ref 0.3–1.2)
Total Protein: 6.4 g/dL — ABNORMAL LOW (ref 6.5–8.1)

## 2020-09-16 LAB — CBC
HCT: 25.1 % — ABNORMAL LOW (ref 36.0–46.0)
Hemoglobin: 7.4 g/dL — ABNORMAL LOW (ref 12.0–15.0)
MCH: 25.9 pg — ABNORMAL LOW (ref 26.0–34.0)
MCHC: 29.5 g/dL — ABNORMAL LOW (ref 30.0–36.0)
MCV: 87.8 fL (ref 80.0–100.0)
Platelets: 257 10*3/uL (ref 150–400)
RBC: 2.86 MIL/uL — ABNORMAL LOW (ref 3.87–5.11)
RDW: 16.8 % — ABNORMAL HIGH (ref 11.5–15.5)
WBC: 8 10*3/uL (ref 4.0–10.5)
nRBC: 0 % (ref 0.0–0.2)

## 2020-09-16 LAB — TSH: TSH: 71.026 u[IU]/mL — ABNORMAL HIGH (ref 0.350–4.500)

## 2020-09-16 LAB — LIPASE, BLOOD: Lipase: 25 U/L (ref 11–51)

## 2020-09-16 LAB — GLUCOSE, CAPILLARY
Glucose-Capillary: 149 mg/dL — ABNORMAL HIGH (ref 70–99)
Glucose-Capillary: 155 mg/dL — ABNORMAL HIGH (ref 70–99)
Glucose-Capillary: 136 mg/dL — ABNORMAL HIGH (ref 70–99)
Glucose-Capillary: 138 mg/dL — ABNORMAL HIGH (ref 70–99)
Glucose-Capillary: 166 mg/dL — ABNORMAL HIGH (ref 70–99)
Glucose-Capillary: 149 mg/dL — ABNORMAL HIGH (ref 70–99)

## 2020-09-16 LAB — MAGNESIUM: Magnesium: 2.2 mg/dL (ref 1.7–2.4)

## 2020-09-16 LAB — HEMOGLOBIN AND HEMATOCRIT, BLOOD
HCT: 31.6 % — ABNORMAL LOW (ref 36.0–46.0)
HCT: 23.2 % — ABNORMAL LOW (ref 36.0–46.0)
Hemoglobin: 9.4 g/dL — ABNORMAL LOW (ref 12.0–15.0)
Hemoglobin: 7.1 g/dL — ABNORMAL LOW (ref 12.0–15.0)

## 2020-09-16 MED ORDER — HYDROMORPHONE HCL 1 MG/ML IJ SOLN
1.0000 mg | Freq: Once | INTRAMUSCULAR | Status: AC
  Administered 2020-09-16: 1 mg via INTRAVENOUS
  Filled 2020-09-16: qty 1

## 2020-09-16 MED ORDER — LACTATED RINGERS IV BOLUS
500.0000 mL | Freq: Once | INTRAVENOUS | Status: AC
  Administered 2020-09-16: 500 mL via INTRAVENOUS

## 2020-09-16 MED ORDER — BISACODYL 10 MG RE SUPP
10.0000 mg | Freq: Every day | RECTAL | Status: DC | PRN
  Administered 2020-09-18: 10 mg via RECTAL
  Filled 2020-09-16: qty 1

## 2020-09-16 MED ORDER — NOREPINEPHRINE 4 MG/250ML-% IV SOLN
2.0000 ug/min | INTRAVENOUS | Status: DC
  Administered 2020-09-17: 2 ug/min via INTRAVENOUS
  Filled 2020-09-16: qty 250

## 2020-09-16 MED ORDER — SODIUM CHLORIDE 0.9 % IV SOLN: 250.0000 mL | INTRAVENOUS | Status: DC

## 2020-09-16 MED ORDER — SIMETHICONE 40 MG/0.6ML PO SUSP: 40.0000 mg | Freq: Four times a day (QID) | ORAL | Status: DC

## 2020-09-16 NOTE — Progress Notes (Signed)
eLink Physician-Brief Progress Note Patient Name: Kathleen Cameron DOB: 11-02-1965 MRN: [id number]   Date of Service  09/16/2020  HPI/Events of Note  Patient complaining of tracheostomy site pain.  eICU Interventions  Dilaudid 1 mg iv now ordered.        [name] Frayda Egley 09/16/2020, 4:28 AM

## 2020-09-16 NOTE — Progress Notes (Signed)
eLink Physician-Brief Progress Note Patient Name: Kristee Angus DOB: Oct 31, 1965 MRN: [id number]   Date of Service  09/16/2020  HPI/Events of Note  hypotensive, 75/21 (60), HR 84.  Has been hypotensive since 1700.  No meds have been given that may cause hypotension.  No MIVF infusing.   Clonidine dose being held.  Please review.  Discussed with RN, dilaudid making low BP?  Camera: Trach, sats 100%. HR 90. No fever. MAP < 65. Has PEG tube.  Anemia stable.   eICU Interventions  LR bolus 500 ml, call back if not better     Intervention Category Intermediate Interventions: Hypotension - evaluation and management  [name] 09/16/2020, 9:44 PM

## 2020-09-16 NOTE — Progress Notes (Signed)
PROGRESS NOTE  Kathleen Cameron XHB:[id number] DOB: 1965/09/23   PCP: Horald Pollen, MD  Patient is from: Home  DOA: 08/29/2020 LOS: 45  Chief complaints: Decreased responsiveness  Brief Narrative / Interim history: 55 year old F with PMH of COPD/hypoxemic and hypercapnic RF on home vent via trach, seizure disorder, dysphagia on TF via G-tube, recurrent hospitalization with respiratory failure in the setting of mucous plugging, seizure disorder, hypothyroidism and anxiety brought to ED with decreased responsiveness and desaturation to 85% on home vent, and admitted with IV fluid on chronic hypoxic and hypercapnic respiratory failure and severe sepsis.  Patient completed empiric antibiotics with IV Zosyn for 7 days.  Hospital course complicated by ileus and G-tube dislodgment that was replaced by IR on 5/25.  Family not interested in SNF or LTAC.  Patient is a stable for discharge home with home health but home health agency can't provide [facility] until 5/31.  Patient needs 24/7 [facility] RN.  Subjective: Seen and examined earlier this morning.  Patient complained pain at tracheostomy site, and was given IV Dilaudid 1 mg x 1 earlier this morning.  She is somewhat drowsy but denies pain.  Husband at bedside.  Hemodynamics seems to stable on bedside monitor.   Objective: Vitals:   09/16/20 1000 09/16/20 1059 09/16/20 1100 09/16/20 1120  BP: 140/90 140/90 (!) 148/100   Pulse: (!) 102  91   Resp: (!) 24  (!) 28   Temp:      TempSrc:      SpO2: 100%  99% 99%  Weight:      Height:        Intake/Output Summary (Last 24 hours) at 09/16/2020 1237 Last data filed at 09/16/2020 1100 Gross per 24 hour  Intake 1150 ml  Output 600 ml  Net 550 ml   Filed Weights   09/14/20 0500 09/15/20 0410 09/16/20 0500  Weight: 57 kg 57.2 kg 58.6 kg    Examination:  GENERAL: No apparent distress.  Nontoxic. HEENT: MMM.  Vision and hearing grossly intact.  NECK: Tracheostomy in place. RESP: On pressure  support via trach.  No IWOB.  Fair aeration bilaterally. CVS:  RRR. Heart sounds normal.  ABD/GI/GU: BS+. Abd soft, NTND.  PEG tube in place. MSK/EXT:  Moves extremities. No apparent deformity. No edema.  SKIN: no apparent skin lesion or wound NEURO: Somewhat drowsy.  No apparent focal neuro deficit. PSYCH: Calm.  No distress or agitation.  Procedures:  5/25-PEG tube replacement by IR  Microbiology summarized: 5/11-COVID-19 and influenza PCR nonreactive 5/11-MRSA PCR screen negative 5/11-blood culture with pansensitive Staph capitis in aerobic bottles 5/12-blood culture negative  Assessment & Plan: Acute on chronic RF with hypoxia and hypercapnia due to tracheobronchitis and mucous plugging Severe chronic COPD with emphysema Severe sepsis due to tracheobronchitis -Completed 7 days of IV Zosyn -PCCM managing vent -Home once [facility] RN available for 24/7 care at home  Uncontrolled hypertension: BP fluctuating.  Low BPs earlier after IV Dilaudid. Now slightly high -Continue amlodipine 10 mg daily -Add clonidine 0.1 mg 3 times daily on 5/27 -Continue labetalol as needed -Manage anxiety as below.  Abnormal blood culture: blood culture with pansensitive Staph capitis in aerobic bottles likely contaminant.  Regardless, covered with antibiotic as above  Dysphagia/PEG tube malfunction -PEG tube replaced by IR on 5/25 -TF resumed. -Dysphagia 1 diet per SLP  Ileus/generalized abdominal pain: Seems to have resolved.  KUB with improved bowel gas -Continue bowel regimen as needed -Check KUB   Acute urinary retention: Resolved.  Iron deficiency anemia: H&H slightly low this morning.  No signs of bleeding. Recent Labs    09/09/20 0015 09/10/20 0115 09/11/20 0333 09/12/20 0133 09/12/20 2123 09/13/20 0212 09/14/20 2325 09/15/20 0147 09/15/20 0334 09/16/20 0010  HGB 9.5* 8.9* 8.8* 7.3* 8.3* 9.0* 9.9* 8.3* 9.2* 7.4*  -Recheck H&H in the afternoon  Hypothyroidism: her TSH was 55 on  5/17.  Likely not taking her Synthroid at home -Continue Synthroid -Recheck TSH  History of seizure disorder: -Not on antiepileptic medications.   History of CVA -Continue home statin  Anxiety/insomnia/agitation: she had an episode of agitation which was thought to be due to mucous plugging.  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  Moderate malnutrition Body mass index is 24.41 kg/m. Nutrition Problem: Moderate Malnutrition Etiology: chronic illness (COPD, trach on chronic vent support) Signs/Symptoms: mild fat depletion,mild muscle depletion,moderate muscle depletion Interventions: Refer to RD note for recommendations   DVT prophylaxis:  enoxaparin (LOVENOX) injection 40 mg Start: 09/13/20 1700 SCDs Start: 08/29/20 2024  Code Status: Full code Family Communication: Updated patient's husband at bedside Level of care: ICU Status is: Inpatient  Remains inpatient appropriate because:Unsafe d/c plan.  Patient needs [facility] RN 24/7 that won't be available until 5/31.    Dispo: The patient is from: Home              Anticipated d/c is to: Home              Patient currently is medically stable to d/c.   Difficult to place patient No       Consultants:  PCCM IR   Sch Meds:  Scheduled Meds: . amLODipine  10 mg Per Tube Daily  . arformoterol  15 mcg Nebulization BID  . bisacodyl  10 mg Rectal Q0600  . budesonide  0.25 mg Nebulization BID  . chlorhexidine gluconate (MEDLINE KIT)  15 mL Mouth Rinse BID  . Chlorhexidine Gluconate Cloth  6 each Topical Daily  . clonazePAM  0.5 mg Per Tube TID  . cloNIDine  0.1 mg Per Tube TID  . enoxaparin (LOVENOX) injection  40 mg Subcutaneous Q24H  . feeding supplement (PROSource TF)  45 mL Per Tube BID  . levothyroxine  88 mcg Per Tube Q0600  . mouth rinse  15 mL Mouth Rinse 10 times  per day  . metoCLOPramide (REGLAN) injection  10 mg Intravenous Q8H  . multivitamin with minerals  1 tablet Per Tube Daily  . pantoprazole sodium  40 mg Per Tube Daily  . revefenacin  175 mcg Nebulization Daily  . rosuvastatin  40 mg Per Tube Daily  . simethicone  40 mg Per Tube Q6H  . temazepam  15 mg Per Tube QHS  . venlafaxine  37.5 mg Per Tube BID WC   Continuous Infusions: . sodium chloride Stopped (08/31/20 2128)  . sodium chloride Stopped (08/31/20 1907)  . feeding supplement (OSMOLITE 1.5 CAL) 1,000 mL (09/15/20 2122)   PRN Meds:.sodium chloride, acetaminophen, albuterol, docusate, HYDROmorphone (DILAUDID) injection, labetalol, ondansetron (ZOFRAN) IV, phenol, polyethylene glycol, sodium chloride  Antimicrobials: Anti-infectives (From admission, onward)   Start     Dose/Rate Route Frequency Ordered Stop   08/30/20 1830  vancomycin (VANCOREADY) IVPB 1000 mg/200 mL  Status:  Discontinued        1,000 mg 200 mL/hr over 60 Minutes Intravenous Every 24 hours 08/30/20 0847 08/30/20 1038   08/30/20  1830  vancomycin (VANCOREADY) IVPB 750 mg/150 mL  Status:  Discontinued        750 mg 150 mL/hr over 60 Minutes Intravenous Every 24 hours 08/30/20 1038 09/01/20 1322   08/30/20 0600  vancomycin (VANCOREADY) IVPB 750 mg/150 mL  Status:  Discontinued        750 mg 150 mL/hr over 60 Minutes Intravenous Every 12 hours 08/29/20 1822 08/30/20 0847   08/30/20 0400  ceFEPIme (MAXIPIME) 2 g in sodium chloride 0.9 % 100 mL IVPB  Status:  Discontinued        2 g 200 mL/hr over 30 Minutes Intravenous Every 8 hours 08/29/20 1822 08/29/20 2038   08/29/20 2200  piperacillin-tazobactam (ZOSYN) IVPB 3.375 g        3.375 g 12.5 mL/hr over 240 Minutes Intravenous Every 8 hours 08/29/20 2038 09/05/20 0140   08/29/20 2030  piperacillin-tazobactam (ZOSYN) IVPB 3.375 g  Status:  Discontinued        3.375 g 100 mL/hr over 30 Minutes Intravenous Every 6 hours 08/29/20 2021 08/29/20 2038   08/29/20 1715   ceFEPIme (MAXIPIME) 2 g in sodium chloride 0.9 % 100 mL IVPB  Status:  Discontinued        2 g 200 mL/hr over 30 Minutes Intravenous  Once 08/29/20 1700 08/29/20 2033   08/29/20 1715  metroNIDAZOLE (FLAGYL) IVPB 500 mg        500 mg 100 mL/hr over 60 Minutes Intravenous  Once 08/29/20 1700 08/29/20 1824   08/29/20 1715  vancomycin (VANCOREADY) IVPB 1250 mg/250 mL        1,250 mg 166.7 mL/hr over 90 Minutes Intravenous  Once 08/29/20 1700 08/29/20 2028       I have personally reviewed the following labs and images: CBC: Recent Labs  Lab 09/11/20 0333 09/12/20 0133 09/12/20 2123 09/13/20 0212 09/14/20 2325 09/15/20 0147 09/15/20 0334 09/16/20 0010  WBC 7.4 7.0  --  10.2  --  9.1  --  8.0  HGB 8.8* 7.3*   < > 9.0* 9.9* 8.3* 9.2* 7.4*  HCT 28.6* 24.1*   < > 29.4* 29.0* 28.4* 27.0* 25.1*  MCV 85.4 85.5  --  84.7  --  87.4  --  87.8  PLT 184 260  --  233  --  276  --  257   < > = values in this interval not displayed.   BMP &GFR Recent Labs  Lab 09/10/20 0115 09/11/20 0333 09/12/20 0133 09/13/20 0212 09/14/20 2325 09/15/20 0147 09/15/20 0201 09/15/20 0334 09/16/20 0010  NA 135 138 136 137 140  --  141 140 139  K 4.9 4.3 3.7 4.2 3.9  --  3.9 3.8 4.6  CL 94* 97* 96* 97*  --   --  95*  --  94*  CO2 34* 32 33* 30  --   --  36*  --  39*  GLUCOSE 82 81 105* 99  --   --  130*  --  157*  BUN 28* _0 --   --  13  --  24*  CREATININE 0.74 0.69 0.71 1.03*  --   --  0.67  --  1.11*  CALCIUM 9.5 9.7 9.7 9.7  --   --  10.1  --  9.7  MG 1.9 1.9 1.9 1.8  --  2.0  --   --  2.2  PHOS 2.7 3.3  --  4.6  --   --  2.6  --   --  Estimated Creatinine Clearance: 46 mL/min (A) (by C-G formula based on SCr of 1.11 mg/dL (H)). Liver & Pancreas: Recent Labs  Lab 09/13/20 0212 09/15/20 0201 09/16/20 0010  AST  --   --  28  ALT  --   --  15  ALKPHOS  --   --  66  BILITOT  --   --  0.4  PROT  --   --  6.4*  ALBUMIN 2.5* 2.6* 2.3*   Recent Labs  Lab 09/16/20 0010  LIPASE 25    No results for input(s): AMMONIA in the last 168 hours. Diabetic: No results for input(s): HGBA1C in the last 72 hours. Recent Labs  Lab 09/15/20 1934 09/15/20 2318 09/16/20 0335 09/16/20 0727 09/16/20 1153  GLUCAP 166* 173* 149* 155* 136*   Cardiac Enzymes: No results for input(s): CKTOTAL, CKMB, CKMBINDEX, TROPONINI in the last 168 hours. No results for input(s): PROBNP in the last 8760 hours. Coagulation Profile: No results for input(s): INR, PROTIME in the last 168 hours. Thyroid Function Tests: No results for input(s): TSH, T4TOTAL, FREET4, T3FREE, THYROIDAB in the last 72 hours. Lipid Profile: No results for input(s): CHOL, HDL, LDLCALC, TRIG, CHOLHDL, LDLDIRECT in the last 72 hours. Anemia Panel: No results for input(s): VITAMINB12, FOLATE, FERRITIN, TIBC, IRON, RETICCTPCT in the last 72 hours. Urine analysis:    Component Value Date/Time   COLORURINE YELLOW 08/29/2020 1645   APPEARANCEUR HAZY (A) 08/29/2020 1645   LABSPEC 1.009 08/29/2020 1645   PHURINE 8.0 08/29/2020 1645   GLUCOSEU 50 (A) 08/29/2020 [place] 08/29/2020 [place] 08/29/2020 1645   [place] 08/29/2020 1645   PROTEINUR 100 (A) 08/29/2020 1645   UROBILINOGEN 1.0 11/12/2008 1440   NITRITE NEGATIVE 08/29/2020 1645   LEUKOCYTESUR NEGATIVE 08/29/2020 1645   Sepsis Labs: Invalid input(s): PROCALCITONIN, [place]  Microbiology: No results found for this or any previous visit (from the past 240 hour(s)).  Radiology Studies: DG Abd Portable 1V  Result Date: 09/15/2020 CLINICAL DATA:  Abdominal distention. EXAM: PORTABLE ABDOMEN - 1 VIEW COMPARISON:  09/12/2020.  CT, 09/02/2020. FINDINGS: Generalized increase in bowel gas, but no bowel distension to suggest obstruction or significant adynamic ileus. Left upper quadrant gastrostomy tube appears well positioned, and is stable. IMPRESSION: 1. No acute findings.  No evidence of bowel obstruction. 2. Mild  generalized increase in bowel gas. Electronically Signed   By: Lajean Manes M.D.   On: 09/15/2020 13:03      Taye T[place]  If 7PM-7AM, please contact night-coverage [email or web address] 09/16/2020, 12:37 PM

## 2020-09-16 NOTE — Progress Notes (Addendum)
eLink Physician-Brief Progress Note Patient Name: Kathleen Cameron DOB: 06-09-1965 MRN: [id number]   Date of Service  09/16/2020  HPI/Events of Note  Pt. has ducolax supp ordered for daily & colace prn.  RN asking if you will change dulcolax supp to prn as well.  Also, LR bolus almost completed.  BP remains low, 69/54 (61), HR 82.  No documented doses of dilaudid given today.  Severe COPD, anemia. PEG tube.   eICU Interventions  - get LA, ABG and Hg - start Levophed gtt to keep MAP > 65.      Intervention Category Intermediate Interventions: Hypotension - evaluation and management;Other:  [name] 09/16/2020, 10:42 PM   23:06 Dulcolax changed to prn. Repeat BP with cuff changes SBP > 80, MAP > 60. RN want to wait for Levophed gtt to start if MAP still low < 65.

## 2020-09-17 DIAGNOSIS — R1312 Dysphagia, oropharyngeal phase: Secondary | ICD-10-CM | POA: Diagnosis not present

## 2020-09-17 DIAGNOSIS — A419 Sepsis, unspecified organism: Secondary | ICD-10-CM | POA: Diagnosis not present

## 2020-09-17 DIAGNOSIS — D509 Iron deficiency anemia, unspecified: Secondary | ICD-10-CM | POA: Diagnosis not present

## 2020-09-17 DIAGNOSIS — E440 Moderate protein-calorie malnutrition: Secondary | ICD-10-CM | POA: Diagnosis not present

## 2020-09-17 DIAGNOSIS — R6521 Severe sepsis with septic shock: Secondary | ICD-10-CM | POA: Diagnosis not present

## 2020-09-17 DIAGNOSIS — J9621 Acute and chronic respiratory failure with hypoxia: Secondary | ICD-10-CM | POA: Diagnosis not present

## 2020-09-17 LAB — RENAL FUNCTION PANEL
Albumin: 2.4 g/dL — ABNORMAL LOW (ref 3.5–5.0)
Anion gap: 8 (ref 5–15)
BUN: 38 mg/dL — ABNORMAL HIGH (ref 6–20)
CO2: 38 mmol/L — ABNORMAL HIGH (ref 22–32)
Calcium: 9.9 mg/dL (ref 8.9–10.3)
Chloride: 92 mmol/L — ABNORMAL LOW (ref 98–111)
Creatinine, Ser: 1.28 mg/dL — ABNORMAL HIGH (ref 0.44–1.00)
GFR, Estimated: 49 mL/min — ABNORMAL LOW (ref >60)
Glucose, Bld: 155 mg/dL — ABNORMAL HIGH (ref 70–99)
Phosphorus: 3.1 mg/dL (ref 2.5–4.6)
Potassium: 4.9 mmol/L (ref 3.5–5.1)
Sodium: 138 mmol/L (ref 135–145)

## 2020-09-17 LAB — GLUCOSE, CAPILLARY
Glucose-Capillary: 109 mg/dL — ABNORMAL HIGH (ref 70–99)
Glucose-Capillary: 137 mg/dL — ABNORMAL HIGH (ref 70–99)
Glucose-Capillary: 150 mg/dL — ABNORMAL HIGH (ref 70–99)
Glucose-Capillary: 140 mg/dL — ABNORMAL HIGH (ref 70–99)
Glucose-Capillary: 156 mg/dL — ABNORMAL HIGH (ref 70–99)
Glucose-Capillary: 153 mg/dL — ABNORMAL HIGH (ref 70–99)

## 2020-09-17 LAB — MAGNESIUM: Magnesium: 2.4 mg/dL (ref 1.7–2.4)

## 2020-09-17 LAB — HEMOGLOBIN AND HEMATOCRIT, BLOOD
HCT: 25.5 % — ABNORMAL LOW (ref 36.0–46.0)
Hemoglobin: 7.7 g/dL — ABNORMAL LOW (ref 12.0–15.0)

## 2020-09-17 LAB — LACTIC ACID, PLASMA
Lactic Acid, Venous: 1.6 mmol/L (ref 0.5–1.9)
Lactic Acid, Venous: 1.1 mmol/L (ref 0.5–1.9)

## 2020-09-17 MED ORDER — LEVOTHYROXINE SODIUM 100 MCG PO TABS
100.0000 ug | ORAL_TABLET | Freq: Every day | ORAL | Status: DC
  Administered 2020-09-18: 100 ug
  Filled 2020-09-17: qty 1

## 2020-09-17 MED ORDER — GUAIFENESIN 100 MG/5ML PO SOLN
5.0000 mL | Freq: Four times a day (QID) | ORAL | Status: DC
  Administered 2020-09-17 – 2020-09-18 (×5): 100 mg
  Filled 2020-09-17 (×5): qty 5

## 2020-09-17 NOTE — Progress Notes (Addendum)
NAME:  Kathleen Cameron, MRN:  [id number], DOB:  1965-12-02, LOS: 19 ADMISSION DATE:  08/29/2020, CONSULTATION DATE:  08/29/2020 REFERRING MD:  Dr. Allena Katz CHIEF COMPLAINT:  SOB  History of Present Illness:  55 yo female presented to ED with dyspnea and hypoxia with SpO2 85%.  She had fever and found to have mucus plugging.  Started on antibiotics.  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  She is followed in trach clinic [name].  She has hx of respiratory arrest from mucus plugging in the setting of Corynebacterium pneumonia.  Pertinent Medical History:  Anxiety, HTN, Seizures  Significant Hospital Events:  . 5/11 Increasing SOB, presented to [facility] ED, code sepsis called, Pip-tazo>, Vanc> flagyl x1, head ct neg, CT abd pelvis grossly unremarkable, UC> , BC> . 5/12 Anxious overnight, improved with Xanax.  . 5/15 surgery consulted for abdominal pain . 5/16 GI consulted . 5/20 anxiety driven worsening respiratory mechanics; hypotension, mild AKI . 5/21 better anxiety control . 5/23 PEG fell out . 5/25 PEG replaced . 5/30 Episode of hypotension briefly requiring pressors. Separate episode of transient bradycardia - self resolved  Tests:   CT head 5/11 >> atrophy and small vessel ischemic changes  CT abd/pelvis 5/11 >> distended stomach  CT abd/pelvis 5/15 >> no acute findings  Interim History / Subjective:  Overnight became hypotensive and required levophed for 3 hours and weaned off after IVF bolus. Unclear if medication related. Also had bradycardia for a few seconds while sleeping  Objective   Blood pressure 117/76, pulse 91, temperature 97.7 F (36.5 C), temperature source Oral, resp. rate (!) 21, height 5\' 1"  (1.549 m), weight 61.6 kg, SpO2 100 %.    Vent Mode: PCV FiO2 (%):  [40 %] 40 % Set Rate:  [16 bmp] 16 bmp PEEP:  [5 cmH20] 5 cmH20 Plateau Pressure:  [22 cmH20-24 cmH20] 23 cmH20   Intake/Output Summary (Last 24  hours) at 09/17/2020 0820 Last data filed at 09/17/2020 0700 Gross per 24 hour  Intake 1673.32 ml  Output 800 ml  Net 873.32 ml   Filed Weights   09/15/20 0410 09/16/20 0500 09/17/20 0500  Weight: 57.2 kg 58.6 kg 61.6 kg   Physical Exam: General: Chronically ill appearing, no acute distress, eating at bedside HENT: [place], AT, OP clear, MMM Neck: Trach in place, c/d/i Eyes: EOMI, no scleral icterus Respiratory: Coarse breath sounds bilaterally.  No crackles, wheezing or rales Cardiovascular: RRR, -M/R/G, no JVD Extremities:-Edema,-tenderness Neuro: Awake, alert, following commands, CNII-XII grossly intact Resolved Hospital Problem list   AKI Hypotension  Assessment & Plan:   Acute on chronic hypoxic respiratory failure from tracheobronchitis, mucus plugging Completed abx 5/17 Chronic vent/trach Severe COPD with emphysema, vent dependent -Patient and family declined SNF and patient will be DC home when [facility] serivice available (on Tuesday) - Pressure control ventilation - Routine trach care - Pulmonary hygiene: scheduled mucinex, consider arranging chest PT at discharge - Will arrange for OP follow up when patient ready for discharge(Dr. Byrum in office, [facility])  Transient hypotension, bradycardia Hypertension during this admission Unclear etiology though suspect medication related - DC clonidine - Continue amlodipine  Anxiety disorder with panic attacks Stable today -Causes de-recruitment, severe hypertension, and ventilator dyssynchrony, last occurred 5/20 -PRN colazepam  Ileus: improving  PEG malfunction: PEG replaced by IR yesterday 5/25 -Continue PEG care and continue reglan and suppositories so that has 1BM every day   Hypothyroidism -Continue synthroid  Hx Sz disorder - No event during this hospitalization. - controlled with benzos  Left subclavian DVT (06/12/2020) - Lovenox (completed 70mo eliquis)   Recurrent admissions-  -Complicated chronic  medical issues at a fairly high acuity with home vent.  -she and husband are adamant against SNF and LTACH; unfortunately this means recurrent admissions as her baseline clinical status cannot be managed at home. Multiple PCCM and palliative providers have discussed this with family without much progress.  Tough situation, continue discussions.  -Plan is discharging to home likely on Tuesday (after holiday) with [facility]  Pulmonary continue to intermittently follow while patient remains inpatient.   Best practice:  Diet: PO diet DVT prophylaxis: lovenox GI prophylaxis: Protonix Mobility: as tolerated Code Status:  full code Disposition: Per primary team

## 2020-09-17 NOTE — Progress Notes (Signed)
eLink Physician-Brief Progress Note Patient Name: Kathleen Cameron DOB: 06/19/1965 MRN: [id number]   Date of Service  09/17/2020  HPI/Events of Note  Pt. had episode where her HR dropped to 38 with some long pauses, lasted only a few seconds.  Happened post reglan iV. RN reports she shivered with the episode & HR back up to 80s.  Pt. was asleep when this happened.  Tele monitor reading irregular HR since episode.  Labs reviewed. Lytes ok.   eICU Interventions  Discussed with RN Continue care. HR 80. On very low dose Levo 1 mcg/min.       Intervention Category Intermediate Interventions: Other:;Hypotension - evaluation and management  [name] 09/17/2020, 12:54 AM

## 2020-09-17 NOTE — Progress Notes (Signed)
RT note- Patient transitioned to her home Trilogy ventilator. Tolerating well, slight variance with home settings, VT set at 480, slightly higher PIP's, continue to monitor.

## 2020-09-17 NOTE — Progress Notes (Signed)
PROGRESS NOTE  Kathleen Cameron ZOX:[id number] DOB: [birth year]   PCP: Horald Pollen, MD  Patient is from: Home  DOA: 08/29/2020 LOS: 17  Chief complaints: Decreased responsiveness  Brief Narrative / Interim history: 55 year old F with PMH of COPD/hypoxemic and hypercapnic RF on home vent via trach, seizure disorder, dysphagia on TF via G-tube, recurrent hospitalization with respiratory failure in the setting of mucous plugging, seizure disorder, hypothyroidism and anxiety brought to ED with decreased responsiveness and desaturation to 85% on home vent, and admitted with IV fluid on chronic hypoxic and hypercapnic respiratory failure and severe sepsis.  Patient completed empiric antibiotics with IV Zosyn for 7 days.  Hospital course complicated by ileus and G-tube dislodgment that was replaced by IR on 5/25.  Family not interested in SNF or LTAC.  Patient is a stable for discharge home with home health but home health agency can't provide [facility] until 5/31.  Patient needs 24/7 [facility] RN.  Patient has recurrent admission and remains high risk for readmission.  Subjective: Seen and examined earlier this morning.  Had an episode of hypotension and briefly started on Levophed.  Hypotension resolved.  She is currently off Levophed.  PCCM discontinued clonidine due to fluctuating BP.  She complains abdominal pain.  She says she really wants to have a bowel movement.  She denies chest pain or other complaints.  Patient's husband at bedside.   Objective: Vitals:   09/17/20 0857 09/17/20 0900 09/17/20 0956 09/17/20 1000  BP:   (!) 140/92 (!) 137/91  Pulse:  91  92  Resp:  (!) 23  (!) 27  Temp:      TempSrc:      SpO2: 100% 100%  100%  Weight:      Height:        Intake/Output Summary (Last 24 hours) at 09/17/2020 1052 Last data filed at 09/17/2020 1000 Gross per 24 hour  Intake 1723.32 ml  Output 1500 ml  Net 223.32 ml   Filed Weights   09/15/20 0410 09/16/20 0500 09/17/20 0500   Weight: 57.2 kg 58.6 kg 61.6 kg    Examination:  GENERAL: No apparent distress.  Nontoxic. HEENT: MMM.  Vision and hearing grossly intact.  NECK: Tracheostomy in place RESP: On pressure support via trach.  No IWOB.  Fair aeration.  Some rhonchi bilaterally CVS:  RRR. Heart sounds normal.  ABD/GI/GU: BS+.  Slight abdominal distention.  Nontender.  PEG tube in place. MSK/EXT:  Moves extremities. No apparent deformity. No edema.  SKIN: no apparent skin lesion or wound NEURO: Awake and alert.  Fairly oriented.  No apparent focal neuro deficit. PSYCH: Calm. Normal affect.   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 RN available for 24/7 care at home.  Declined SNF and LTAC -Outpatient follow-up with Dr. Lamonte Sakai and [name] -Remains high risk for readmission  Uncontrolled hypertension: Very labile blood pressure.  Hypotensive to 60s/30s last night and briefly started on Levophed.  Now normotensive off Levophed -Continue amlodipine 10 mg daily -Agree with discontinuing clonidine -  Continue labetalol as needed -Manage anxiety/panic attack as below.  Abnormal blood culture: blood culture with pansensitive Staph capitis in aerobic bottles likely contaminant.  Regardless, covered with antibiotic as above  Dysphagia/PEG tube malfunction -PEG tube replaced by IR on 5/25 -TF resumed. -Dysphagia 1 diet per  SLP -Continue bowel regimen as needed  Ileus/generalized abdominal pain: Improved.  KUB with improved bowel gas -Continue bowel regimen as needed   Acute urinary retention: Resolved.  Iron deficiency anemia: H&H slightly low this morning.  No signs of bleeding. Recent Labs    09/12/20 2123 09/13/20 0212 09/14/20 2325 09/15/20 0147 09/15/20 0334 09/16/20 0010 09/16/20 1435 09/16/20 2258 09/16/20 2330 09/17/20 0145  HGB 8.3* 9.0* 9.9* 8.3* 9.2* 7.4* 9.4* 7.1* 7.1* 7.7*  -Recheck H&H in the afternoon  Hypothyroidism: her TSH was 55 on 5/17 and 71 on 5/29.  -Increased Synthroid from 88 mcg to 100 mcg on 5/30 -Recheck TSH in about 4 to 6 weeks  History of seizure disorder: -Not on antiepileptic medications.   History of CVA -Continue home statin  Anxiety/insomnia/agitation: she had an episode of agitation which was thought to be due to mucous plugging.  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  Moderate malnutrition Body mass index is 25.66 kg/m. Nutrition Problem: Moderate Malnutrition Etiology: chronic illness (COPD, trach on chronic vent support) Signs/Symptoms: mild fat depletion,mild muscle depletion,moderate muscle depletion Interventions: Refer to RD note for recommendations   DVT prophylaxis:  enoxaparin (LOVENOX) injection 40 mg Start: 09/13/20 1700 SCDs Start: 08/29/20 2024  Code Status: Full code Family Communication: Updated patient's husband at bedside Level of care: ICU Status is: Inpatient  Remains inpatient appropriate because:Unsafe d/c plan.  Patient needs [facility] RN 24/7 that won't be available until 5/31.    Dispo: The patient is from: Home              Anticipated d/c is to: Home              Patient currently is medically stable to d/c.   Difficult to place patient No       Consultants:   PCCM IR   Sch Meds:  Scheduled Meds: . amLODipine  10 mg Per Tube Daily  . arformoterol  15 mcg Nebulization BID  . budesonide  0.25 mg Nebulization BID  . chlorhexidine gluconate (MEDLINE KIT)  15 mL Mouth Rinse BID  . Chlorhexidine Gluconate Cloth  6 each Topical Daily  . clonazePAM  0.5 mg Per Tube TID  . enoxaparin (LOVENOX) injection  40 mg Subcutaneous Q24H  . feeding supplement (PROSource TF)  45 mL Per Tube BID  . guaiFENesin  5 mL Per Tube Q6H  . [START ON 09/18/2020] levothyroxine  100 mcg Per Tube Q0600  . mouth rinse  15 mL Mouth Rinse 10 times per day  . metoCLOPramide (REGLAN) injection  10 mg Intravenous Q8H  . multivitamin with minerals  1 tablet Per Tube Daily  . pantoprazole sodium  40 mg Per Tube Daily  . revefenacin  175 mcg Nebulization Daily  . rosuvastatin  40 mg Per Tube Daily  . simethicone  40 mg Per Tube Q6H  . temazepam  15 mg Per Tube QHS  . venlafaxine  37.5 mg Per Tube BID WC   Continuous Infusions: . sodium chloride Stopped (08/31/20 2128)  . sodium chloride Stopped (08/31/20 1907)  . sodium chloride    .  feeding supplement (OSMOLITE 1.5 CAL) 1,000 mL (09/15/20 2122)  . norepinephrine (LEVOPHED) Adult infusion Stopped (09/17/20 0222)   PRN Meds:.sodium chloride, acetaminophen, albuterol, bisacodyl, docusate, HYDROmorphone (DILAUDID) injection, labetalol, ondansetron (ZOFRAN) IV, phenol, polyethylene glycol, sodium chloride  Antimicrobials: Anti-infectives (From admission, onward)   Start     Dose/Rate Route Frequency Ordered Stop   08/30/20 1830  vancomycin (VANCOREADY) IVPB 1000 mg/200 mL  Status:  Discontinued        1,000 mg 200 mL/hr over 60 Minutes Intravenous Every 24 hours 08/30/20 0847 08/30/20 1038   08/30/20 1830  vancomycin (VANCOREADY) IVPB 750 mg/150 mL  Status:  Discontinued        750 mg 150 mL/hr over 60 Minutes Intravenous Every 24 hours 08/30/20 1038 09/01/20 1322   08/30/20 0600  vancomycin (VANCOREADY) IVPB 750 mg/150 mL   Status:  Discontinued        750 mg 150 mL/hr over 60 Minutes Intravenous Every 12 hours 08/29/20 1822 08/30/20 0847   08/30/20 0400  ceFEPIme (MAXIPIME) 2 g in sodium chloride 0.9 % 100 mL IVPB  Status:  Discontinued        2 g 200 mL/hr over 30 Minutes Intravenous Every 8 hours 08/29/20 1822 08/29/20 2038   08/29/20 2200  piperacillin-tazobactam (ZOSYN) IVPB 3.375 g        3.375 g 12.5 mL/hr over 240 Minutes Intravenous Every 8 hours 08/29/20 2038 09/05/20 0140   08/29/20 2030  piperacillin-tazobactam (ZOSYN) IVPB 3.375 g  Status:  Discontinued        3.375 g 100 mL/hr over 30 Minutes Intravenous Every 6 hours 08/29/20 2021 08/29/20 2038   08/29/20 1715  ceFEPIme (MAXIPIME) 2 g in sodium chloride 0.9 % 100 mL IVPB  Status:  Discontinued        2 g 200 mL/hr over 30 Minutes Intravenous  Once 08/29/20 1700 08/29/20 2033   08/29/20 1715  metroNIDAZOLE (FLAGYL) IVPB 500 mg        500 mg 100 mL/hr over 60 Minutes Intravenous  Once 08/29/20 1700 08/29/20 1824   08/29/20 1715  vancomycin (VANCOREADY) IVPB 1250 mg/250 mL        1,250 mg 166.7 mL/hr over 90 Minutes Intravenous  Once 08/29/20 1700 08/29/20 2028       I have personally reviewed the following labs and images: CBC: Recent Labs  Lab 09/11/20 0333 09/12/20 0133 09/12/20 2123 09/13/20 0212 09/14/20 2325 09/15/20 0147 09/15/20 0334 09/16/20 0010 09/16/20 1435 09/16/20 2258 09/16/20 2330 09/17/20 0145  WBC 7.4 7.0  --  10.2  --  9.1  --  8.0  --   --   --   --   HGB 8.8* 7.3*   < > 9.0*   < > 8.3*   < > 7.4* 9.4* 7.1* 7.1* 7.7*  HCT 28.6* 24.1*   < > 29.4*   < > 28.4*   < > 25.1* 31.6* 23.2* 21.0* 25.5*  MCV 85.4 85.5  --  84.7  --  87.4  --  87.8  --   --   --   --   PLT 184 260  --  233  --  276  --  257  --   --   --   --    < > = values in this interval not displayed.   BMP &GFR Recent Labs  Lab 09/11/20 2458 09/12/20 0133 09/13/20 0998 09/14/20 2325 09/15/20 0147 09/15/20 0201 09/15/20 3382  09/16/20 0010 09/16/20 2330 09/17/20 0145  NA 138 136 137   < >  --  141 140 139 139 138  K 4.3 3.7 4.2   < >  --  3.9 3.8 4.6 4.7 4.9  CL 97* 96* 97*  --   --  95*  --  94*  --  92*  CO2 32 33* 30  --   --  36*  --  39*  --  38*  GLUCOSE 81 105* 99  --   --  130*  --  157*  --  155*  BUN _0 --   --  13  --  24*  --  38*  CREATININE 0.69 0.71 1.03*  --   --  0.67  --  1.11*  --  1.28*  CALCIUM 9.7 9.7 9.7  --   --  10.1  --  9.7  --  9.9  MG 1.9 1.9 1.8  --  2.0  --   --  2.2  --  2.4  PHOS 3.3  --  4.6  --   --  2.6  --   --   --  3.1   < > = values in this interval not displayed.   Estimated Creatinine Clearance: 40.8 mL/min (A) (by C-G formula based on SCr of 1.28 mg/dL (H)). Liver & Pancreas: Recent Labs  Lab 09/13/20 0212 09/15/20 0201 09/16/20 0010 09/17/20 0145  AST  --   --  28  --   ALT  --   --  15  --   ALKPHOS  --   --  66  --   BILITOT  --   --  0.4  --   PROT  --   --  6.4*  --   ALBUMIN 2.5* 2.6* 2.3* 2.4*   Recent Labs  Lab 09/16/20 0010  LIPASE 25   No results for input(s): AMMONIA in the last 168 hours. Diabetic: No results for input(s): HGBA1C in the last 72 hours. Recent Labs  Lab 09/16/20 1529 09/16/20 1911 09/16/20 2319 09/17/20 0322 09/17/20 0729  GLUCAP 138* 166* 149* 109* 137*   Cardiac Enzymes: No results for input(s): CKTOTAL, CKMB, CKMBINDEX, TROPONINI in the last 168 hours. No results for input(s): PROBNP in the last 8760 hours. Coagulation Profile: No results for input(s): INR, PROTIME in the last 168 hours. Thyroid Function Tests: Recent Labs    09/16/20 1435  TSH 71.026*   Lipid Profile: No results for input(s): CHOL, HDL, LDLCALC, TRIG, CHOLHDL, LDLDIRECT in the last 72 hours. Anemia Panel: No results for input(s): VITAMINB12, FOLATE, FERRITIN, TIBC, IRON, RETICCTPCT in the last 72 hours. Urine analysis:    Component Value Date/Time   COLORURINE YELLOW 08/29/2020 1645   APPEARANCEUR HAZY (A) 08/29/2020 1645    LABSPEC 1.009 08/29/2020 1645   PHURINE 8.0 08/29/2020 1645   GLUCOSEU 50 (A) 08/29/2020 [place] 08/29/2020 [place] 08/29/2020 1645   [place] 08/29/2020 1645   PROTEINUR 100 (A) 08/29/2020 1645   UROBILINOGEN 1.0 11/12/2008 1440   NITRITE NEGATIVE 08/29/2020 1645   LEUKOCYTESUR NEGATIVE 08/29/2020 1645   Sepsis Labs: Invalid input(s): PROCALCITONIN, [place]  Microbiology: No results found for this or any previous visit (from the past 240 hour(s)).  Radiology Studies: No results found.    Kristina Bertone T. [place]  If 7PM-7AM, please contact night-coverage [email or web address] 09/17/2020, 10:52 AM

## 2020-09-17 NOTE — Progress Notes (Signed)
  Speech Language Pathology Treatment: Dysphagia  Patient Details Name: Kathleen Cameron MRN: [id number] DOB: 1966/02/02 Today's Date: 09/17/2020 Time: 6834-1962 SLP Time Calculation (min) (ACUTE ONLY): 14 min  Assessment / Plan / Recommendation Clinical Impression  Pt is much more alert and attentive to L buccal cavity compared to previously documented performance on 5/27. She does not like the pureed foods and is very eager to upgrade. SLP provided regular solids and thin liquids with mildly prolonged mastication, still primarily using her L side for preparation, but clearing her oral cavity with minimal cueing needed. Recommend advancing diet to Dys 3 (mechanical soft) solids, thin liquids. Will continue to follow.    HPI HPI: 55 year old female admitted with recurrent mucous plugging, severe sepsis, chronic respiratory failure with trach, home vent. Apparently had an episode of desaturation at 85% on home vent.  She has had multiple recent hospitalizations for mucous plugging events resulting in respiratory arrest; followed by pulmonology as an outpatient in the trach clinic. Last seen 427 for trach change.   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 FEES 06/15/20 revealed mild oropharyngeal dysphagia specific to pharyngeal residue when inline PMV was not in place.  Without speaking valve, there was transient penetration of thin liquids on initial trial only, but no further penetration and no aspiration. Regular diet and thin liquids was recommended.      SLP Plan  Continue with current plan of care       Recommendations  Diet recommendations: Dysphagia 3 (mechanical soft);Thin liquid Liquids provided via: Cup;Straw Medication Administration: Via alternative means Supervision: Patient able to self feed;Full supervision/cueing for compensatory strategies Compensations: Slow rate;Small sips/bites;Lingual sweep for clearance of  pocketing;Monitor for anterior loss;Follow solids with liquid Postural Changes and/or Swallow Maneuvers: Seated upright 90 degrees;Upright 30-60 min after meal                Oral Care Recommendations: Oral care BID Follow up Recommendations: Home health SLP SLP Visit Diagnosis: Dysphagia, unspecified (R13.10) Plan: Continue with current plan of care       GO                Mahala Menghini., M.A. CCC-SLP Acute Rehabilitation Services Pager [phone] Office [phone]  09/17/2020, 3:29 PM

## 2020-09-18 DIAGNOSIS — R1310 Dysphagia, unspecified: Secondary | ICD-10-CM

## 2020-09-18 DIAGNOSIS — E039 Hypothyroidism, unspecified: Secondary | ICD-10-CM | POA: Diagnosis not present

## 2020-09-18 DIAGNOSIS — F419 Anxiety disorder, unspecified: Secondary | ICD-10-CM

## 2020-09-18 DIAGNOSIS — R5381 Other malaise: Secondary | ICD-10-CM

## 2020-09-18 DIAGNOSIS — J40 Bronchitis, not specified as acute or chronic: Secondary | ICD-10-CM

## 2020-09-18 DIAGNOSIS — A419 Sepsis, unspecified organism: Secondary | ICD-10-CM | POA: Diagnosis not present

## 2020-09-18 DIAGNOSIS — J449 Chronic obstructive pulmonary disease, unspecified: Secondary | ICD-10-CM | POA: Diagnosis not present

## 2020-09-18 DIAGNOSIS — J439 Emphysema, unspecified: Secondary | ICD-10-CM | POA: Diagnosis not present

## 2020-09-18 DIAGNOSIS — F32A Depression, unspecified: Secondary | ICD-10-CM

## 2020-09-18 DIAGNOSIS — F410 Panic disorder [episodic paroxysmal anxiety] without agoraphobia: Secondary | ICD-10-CM

## 2020-09-18 DIAGNOSIS — J440 Chronic obstructive pulmonary disease with acute lower respiratory infection: Secondary | ICD-10-CM

## 2020-09-18 DIAGNOSIS — K9423 Gastrostomy malfunction: Secondary | ICD-10-CM

## 2020-09-18 DIAGNOSIS — G4700 Insomnia, unspecified: Secondary | ICD-10-CM

## 2020-09-18 DIAGNOSIS — F418 Other specified anxiety disorders: Secondary | ICD-10-CM

## 2020-09-18 DIAGNOSIS — R6521 Severe sepsis with septic shock: Secondary | ICD-10-CM | POA: Diagnosis not present

## 2020-09-18 DIAGNOSIS — Z789 Other specified health status: Secondary | ICD-10-CM

## 2020-09-18 DIAGNOSIS — R451 Restlessness and agitation: Secondary | ICD-10-CM

## 2020-09-18 DIAGNOSIS — R339 Retention of urine, unspecified: Secondary | ICD-10-CM

## 2020-09-18 DIAGNOSIS — R0989 Other specified symptoms and signs involving the circulatory and respiratory systems: Secondary | ICD-10-CM

## 2020-09-18 DIAGNOSIS — E43 Unspecified severe protein-calorie malnutrition: Secondary | ICD-10-CM

## 2020-09-18 DIAGNOSIS — Z931 Gastrostomy status: Secondary | ICD-10-CM

## 2020-09-18 LAB — RENAL FUNCTION PANEL
Albumin: 2.5 g/dL — ABNORMAL LOW (ref 3.5–5.0)
Anion gap: 8 (ref 5–15)
BUN: 41 mg/dL — ABNORMAL HIGH (ref 6–20)
CO2: 37 mmol/L — ABNORMAL HIGH (ref 22–32)
Calcium: 9.6 mg/dL (ref 8.9–10.3)
Chloride: 92 mmol/L — ABNORMAL LOW (ref 98–111)
Creatinine, Ser: 1.25 mg/dL — ABNORMAL HIGH (ref 0.44–1.00)
GFR, Estimated: 50 mL/min — ABNORMAL LOW (ref >60)
Glucose, Bld: 147 mg/dL — ABNORMAL HIGH (ref 70–99)
Phosphorus: 2.6 mg/dL (ref 2.5–4.6)
Potassium: 4.2 mmol/L (ref 3.5–5.1)
Sodium: 137 mmol/L (ref 135–145)

## 2020-09-18 LAB — CBC
HCT: 25.7 % — ABNORMAL LOW (ref 36.0–46.0)
Hemoglobin: 7.6 g/dL — ABNORMAL LOW (ref 12.0–15.0)
MCH: 25.6 pg — ABNORMAL LOW (ref 26.0–34.0)
MCHC: 29.6 g/dL — ABNORMAL LOW (ref 30.0–36.0)
MCV: 86.5 fL (ref 80.0–100.0)
Platelets: 265 10*3/uL (ref 150–400)
RBC: 2.97 MIL/uL — ABNORMAL LOW (ref 3.87–5.11)
RDW: 17.2 % — ABNORMAL HIGH (ref 11.5–15.5)
WBC: 7.4 10*3/uL (ref 4.0–10.5)
nRBC: 0 % (ref 0.0–0.2)

## 2020-09-18 LAB — GLUCOSE, CAPILLARY
Glucose-Capillary: 127 mg/dL — ABNORMAL HIGH (ref 70–99)
Glucose-Capillary: 119 mg/dL — ABNORMAL HIGH (ref 70–99)
Glucose-Capillary: 121 mg/dL — ABNORMAL HIGH (ref 70–99)

## 2020-09-18 LAB — MAGNESIUM: Magnesium: 2.4 mg/dL (ref 1.7–2.4)

## 2020-09-18 MED ORDER — LEVOTHYROXINE SODIUM 88 MCG PO TABS: 88.0000 ug | ORAL_TABLET | Freq: Every day | ORAL | 1 refills | Status: DC

## 2020-09-18 MED ORDER — AMLODIPINE BESYLATE 10 MG PO TABS: 10.0000 mg | ORAL_TABLET | Freq: Every day | ORAL | 1 refills | Status: AC

## 2020-09-18 MED ORDER — SIMETHICONE 40 MG/0.6ML PO SUSP: 40.0000 mg | Freq: Four times a day (QID) | ORAL | 0 refills | Status: AC

## 2020-09-18 NOTE — Progress Notes (Signed)
RT note-[facility] for patient transport home.

## 2020-09-18 NOTE — Discharge Summary (Signed)
Physician Discharge Summary  Kathleen Cameron KAJ:[id number] DOB: [birth year] DOA: 08/29/2020  PCP: Horald Pollen, MD  Admit date: 08/29/2020 Discharge date: 09/18/2020  Admitted From: Home Disposition: Home  Recommendations for Outpatient Follow-up:  1. Follow ups as below. 2. Please obtain CBC/BMP/Mag at follow up 3. Please follow up on the following pending results: None  Home Health: PT/OT/RN/RT Equipment/Devices: Patient has appropriate DME's  Discharge Condition: Stable CODE STATUS: Full code   Follow-up Information    Horald Pollen, MD. Schedule an appointment as soon as possible for a visit in 1 week(s).   Specialty: Internal Medicine Contact information: [place] [address] [phone]               Hospital Course: 55 year old F with PMH of COPD/hypoxemic and hypercapnic RF on home vent via trach, seizure disorder, dysphagia on TF via G-tube, recurrent hospitalization with respiratory failure in the setting of mucous plugging, seizure disorder, hypothyroidism and anxiety brought to ED with decreased responsiveness and desaturation to 85% on home vent, and admitted with IV fluid on chronic hypoxic and hypercapnic respiratory failure and severe sepsis.  Patient completed empiric antibiotics with IV Zosyn for 7 days.  Hospital course complicated by ileus, G-tube dislodgment, anxiety with panic attack and AKI likely due to labile blood pressure.  She has had PEG tube replaced by IR on 5/25.  She tolerated tube feed after PEG tube replacement.  She is also on dysphagia 3 diet.  Anxiety and panic attack improved with as needed medications.  Blood pressure stabilized.  AKI started improving.  In regards to respiratory failure, patient remained stable on home vent setting prior to discharge.  She was cleared for discharge by pulmonology for outpatient follow-up with her pulmonologist and trach specialist.  She will be discharged on home  vent/trilogy with home health.  Family not interested in SNF or LTAC despite a high risk for deconditioning and rehospitalization.  She is discharged with home health RN/PT/OT/RN team.  She has home vent/trilogy.   See individual problem list below for more on hospital course.  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  Declined SNF and LTAC -Pulmonology to arrange outpatient follow-up with Dr. Lamonte Sakai (pulmonologist) and [name] (trach clinic)   Uncontrolled hypertension:  abile blood pressures stable after discontinuing clonidine -Continue amlodipine 10 mg daily -Manage anxiety/panic attack as below.  Abnormal blood culture: blood culture with pansensitive Staph capitis in aerobic bottles likely contaminant.  Regardless, covered with antibiotic as above  Dysphagia/PEG tube malfunction -PEG tube replaced by IR on 5/25 -Continue tube feed as below -Dysphagia 3 diet per SLP -Continue bowel regimen as needed  Ileus/generalized abdominal pain: Improved.  KUB with improved bowel gas -Continue bowel regimen as needed  Acute urinary retention: Resolved.  Iron deficiency anemia: H&H slightly low this morning.  No signs of bleeding. Recent Labs (within last 365 days)              Recent Labs    09/12/20 2123 09/13/20 4680 09/14/20 2325 09/15/20 0147 09/15/20 0334 09/16/20 0010 09/16/20 1435 09/16/20 2258 09/16/20 2330 09/17/20 0145  HGB 8.3* 9.0*  9.9* 8.3* 9.2* 7.4* 9.4* 7.1* 7.1* 7.7*    -Recheck CBC in one week  Hypothyroidism: her TSH was 55 on 5/17 and 71 on 5/29.   -Synthroid increased from 50 mcg to 88 mcg on 5/19 -Recheck TSH in about 4 to 6 weeks  History of seizure disorder: -Not on antiepileptic medications.   History of CVA -Continue home statin  Anxiety/insomnia/agitation: she had an episode of agitation which was thought to be due to mucous plugging.  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  Recurrent admissions-  -Complicated chronic medical issues at a fairly high acuity with home vent.  -she and husband are adamant against SNF and LTACH; unfortunately this means recurrent admissions as her baseline clinical status cannot be managed at home. Multiple PCCM and palliative providers have discussed this with family without much progress.  Tough situation, continue discussions.  Moderate malnutrition -Resume home TF.  She is also on dysphagia 3 diet Body mass index is 24.58 kg/m. Nutrition Problem: Moderate Malnutrition Etiology: chronic illness (COPD, trach on chronic vent support) Signs/Symptoms: mild fat depletion,mild muscle depletion,moderate muscle depletion Interventions: Refer to RD note for recommendations      Discharge Exam: Vitals:   09/18/20 0600 09/18/20 0735  BP: 115/87   Pulse: (!) 107   Resp: (!) 21   Temp:  97.7 F (36.5 C)  SpO2: 100%     GENERAL: No apparent distress.  Nontoxic. HEENT: MMM.  Vision and hearing grossly intact.  NECK: Tracheostomy in place. RESP: On home vent via trach.  No IWOB.  Fair aeration bilaterally. CVS:  RRR. Heart sounds normal.  ABD/GI/GU: Bowel sounds present. Soft. Non tender.  PEG tube in place. MSK/EXT:  Moves extremities. No apparent deformity. No edema.  SKIN: no apparent skin lesion or wound NEURO: Awake and alert.  Seems to be fairly oriented.  No apparent focal neuro deficit. PSYCH: Calm. Normal affect.    Discharge Instructions  Discharge Instructions    Call MD for:  difficulty breathing, headache or visual disturbances   Complete by: As directed    Call MD for:  difficulty breathing, headache or visual disturbances   Complete by: As directed    Call MD for:  extreme fatigue   Complete by: As directed    Call MD for:  extreme fatigue   Complete by: As directed    Call MD for:  persistant nausea and vomiting   Complete by: As directed    Call MD for:  severe uncontrolled pain   Complete by: As directed    Call MD for:  temperature >100.4   Complete by: As directed    Diet - low sodium heart healthy   Complete by: As directed    Diet general   Complete by: As directed    Discharge instructions   Complete by: As directed    It has been a pleasure taking care of you!  You were hospitalized due to acute on chronic respiratory failure for which you have been treated with IV antibiotics, breathing treatments and mechanical ventilation.  With that, your symptoms improved to the point we think it is safe to let you go home and follow-up with your primary care doctor and lung doctor.  Please review your new medication list and the directions on your medications before you take them.   Take care,   Discharge instructions   Complete  by: As directed    It has been a pleasure taking care of you!  You were hospitalized due to respiratory failure from infection and mucous plugging, and PEG tube malfunction.  You have been treated with antibiotics for infection.  Your PEG tube has been replaced.  Your symptoms improved .  We have made some adjustment to your home medications during this hospitalization.  Please review your new medication list and the directions on your medications before you take them.  Follow-up with your pulmonologist and tracheostomy specialist.   Take care,   Increase activity slowly   Complete by: As directed    Increase activity slowly   Complete by: As directed     No wound care   Complete by: As directed    No wound care   Complete by: As directed      Allergies as of 09/18/2020      Reactions   Stiolto Respimat [tiotropium Bromide-olodaterol] Other (See Comments)   Severe headaches and rapid heartrate      Medication List    STOP taking these medications   apixaban 5 MG Tabs tablet Commonly known as: ELIQUIS     TAKE these medications   amLODipine 10 MG tablet Commonly known as: NORVASC Place 1 tablet (10 mg total) into feeding tube daily. What changed:   medication strength  how much to take  how to take this   arformoterol 15 MCG/2ML Nebu Commonly known as: BROVANA Take 2 mLs (15 mcg total) by nebulization 2 (two) times daily.   bisacodyl 10 MG suppository Commonly known as: DULCOLAX Place 1 suppository (10 mg total) rectally daily as needed for severe constipation.   budesonide 0.25 MG/2ML nebulizer solution Commonly known as: PULMICORT Take 2 mLs (0.25 mg total) by nebulization 2 (two) times daily.   chlorhexidine gluconate (MEDLINE KIT) 0.12 % solution Commonly known as: PERIDEX 15 mLs by Mouth Rinse route 2 (two) times daily.   Chlorhexidine Gluconate Cloth 2 % Pads Apply 6 each topically daily.   clonazePAM 1 MG tablet Commonly known as: KLONOPIN Place 1/2 tablet (0.5 mg total) into feeding tube 3 (three) times daily as needed for anxiety.   Ensure Active High Protein Liqd Take 1 Can by mouth in the morning and at bedtime.   feeding supplement (OSMOLITE 1.2 CAL) Liqd Place 1,000 mLs into feeding tube continuous.   ipratropium-albuterol 0.5-2.5 (3) MG/3ML Soln Commonly known as: DUONEB Take 3 mLs by nebulization every 4 (four) hours.   levothyroxine 88 MCG tablet Commonly known as: SYNTHROID Take 1 tablet (88 mcg total) by mouth daily at 6 (six) AM. What changed:   medication strength  how much to take   lip balm ointment Apply topically as needed for lip care.   metoCLOPramide 5 MG/5ML  solution Commonly known as: REGLAN Place 10 mLs (10 mg total) into feeding tube in the morning and at bedtime.   multivitamin with minerals Tabs tablet Place 1 tablet into feeding tube daily.   oxyCODONE-acetaminophen 5-325 MG tablet Commonly known as: PERCOCET/ROXICET PLACE 1 TABLET INTO FEEDING TUBE EVERY EIGHT HOURS AS NEEDED FOR UP TO 3 DAYS FOR SEVERE PAIN. What changed:   how much to take  how to take this  when to take this  reasons to take this   pantoprazole sodium 40 mg/20 mL Pack Commonly known as: PROTONIX Place 20 mLs (40 mg total) into feeding tube at bedtime.   phenol 1.4 % Liqd Commonly known as: CHLORASEPTIC Use as directed  1 spray in the mouth or throat as needed for throat irritation / pain.   polyethylene glycol 17 g packet Commonly known as: MIRALAX / GLYCOLAX Place 17 g into feeding tube daily as needed for mild constipation.   revefenacin 175 MCG/3ML nebulizer solution Commonly known as: YUPELRI Take 3 mLs (175 mcg total) by nebulization daily.   rosuvastatin 40 MG tablet Commonly known as: CRESTOR Place 1 tablet (40 mg total) into feeding tube daily.   senna-docusate 8.6-50 MG tablet Commonly known as: Senokot-S Place 2 tablets into feeding tube at bedtime as needed for moderate constipation.   simethicone 40 MG/0.6ML drops Commonly known as: MYLICON Place 0.6 mLs (40 mg total) into feeding tube every 6 (six) hours.   sodium chloride 0.65 % Soln nasal spray Commonly known as: OCEAN Place 1 spray into both nostrils as needed for congestion.   sorbitol 70 % Soln Place 30 mLs into feeding tube daily as needed for moderate constipation.   temazepam 15 MG capsule Commonly known as: RESTORIL Place 1 capsule (15 mg total) into feeding tube at bedtime as needed for sleep.   venlafaxine 37.5 MG tablet Commonly known as: EFFEXOR Place 1 tablet (37.5 mg total) into feeding tube 2 (two) times daily with a meal.        Consultations:  Pulmonology  Palliative medicine  Procedures/Studies:  5/25-PEG tube replacement by IR   CT ABDOMEN PELVIS WO CONTRAST  Result Date: 09/02/2020 CLINICAL DATA:  Abdominal distension EXAM: CT ABDOMEN AND PELVIS WITHOUT CONTRAST TECHNIQUE: Multidetector CT imaging of the abdomen and pelvis was performed following the standard protocol without IV contrast. COMPARISON:  08/29/2020 FINDINGS: Lower chest: No acute abnormality. Bandlike scarring and or atelectasis of the included bilateral lung bases, similar to prior examination. Hepatobiliary: No solid liver abnormality is seen. No gallstones, gallbladder wall thickening, or biliary dilatation. Pancreas: Unremarkable. No pancreatic ductal dilatation or surrounding inflammatory changes. Spleen: Normal in size without significant abnormality. Adrenals/Urinary Tract: Adrenal glands are unremarkable. Kidneys are normal, without renal calculi, solid lesion, or hydronephrosis. Bladder is unremarkable. Stomach/Bowel: Percutaneous gastrostomy tube, tip in the distal body of the stomach. Appendix appears normal. No evidence of bowel wall thickening, distention, or inflammatory changes. Vascular/Lymphatic: Aortic atherosclerosis. No enlarged abdominal or pelvic lymph nodes. Reproductive: No mass or other significant abnormality. Other: No abdominal wall hernia or abnormality. Anasarca. Small volume free fluid in the low pelvis (series 3, image 47). Musculoskeletal: No acute or significant osseous findings. IMPRESSION: 1. No non-contrast CT findings of the abdomen or pelvis to explain abdominal distension. 2. Percutaneous gastrostomy tube, tip in the distal body of the stomach. 3. Small volume free fluid in the low pelvis, nonspecific. 4. Anasarca. Aortic Atherosclerosis (ICD10-I70.0). Electronically Signed   By: Eddie Candle M.D.   On: 09/02/2020 11:07   CT ABDOMEN PELVIS WO CONTRAST  Result Date: 08/29/2020 CLINICAL DATA:  55 year old female  with abdominal pain and fever. EXAM: CT ABDOMEN AND PELVIS WITHOUT CONTRAST TECHNIQUE: Multidetector CT imaging of the abdomen and pelvis was performed following the standard protocol without IV contrast. COMPARISON:  CT abdomen pelvis dated 07/03/2020. FINDINGS: Evaluation of this exam is limited in the absence of intravenous contrast. Lower chest: Bibasilar scarring. Small chronic fluid noted along the anterior pleural space. No intra-abdominal free air.  Small free fluid in the pelvis. Hepatobiliary: Diffuse fatty liver. No intrahepatic biliary ductal dilatation. The gallbladder is unremarkable. Pancreas: Unremarkable. No pancreatic ductal dilatation or surrounding inflammatory changes. Spleen: Normal in size without focal abnormality.  Adrenals/Urinary Tract: The adrenal glands unremarkable. The kidneys, visualized ureters, and urinary bladder appear unremarkable. Stomach/Bowel: The stomach is distended. Percutaneous gastrostomy with tip in the distal body of the stomach. There is no bowel obstruction or active inflammation. The appendix is normal. Vascular/Lymphatic: Mild aortoiliac atherosclerotic disease. The IVC is unremarkable. No portal venous gas. There is no adenopathy. Reproductive: The uterus is anteverted and grossly unremarkable. No adnexal masses. Other: None Musculoskeletal: No acute or significant osseous findings. IMPRESSION: 1. Distended stomach. No bowel obstruction. Normal appendix. 2. Fatty liver. 3. Aortic Atherosclerosis (ICD10-I70.0). Electronically Signed   By: Anner Crete M.D.   On: 08/29/2020 21:10   DG Abd 1 View  Result Date: 09/09/2020 CLINICAL DATA:  Gastrostomy catheter placement EXAM: ABDOMEN - 1 VIEW COMPARISON:  09/08/2020 FINDINGS: Normal abdominal gas pattern. Endoscopic clip noted within the a right hemipelvis. Three metallic densities within the left upper quadrant are compatible with T tacks. A probable balloon retention catheter is seen within this region and,  while overlying the expected position, intraluminal position of the retaining balloon cannot be determined in absence of contrast administration. No gross free intraperitoneal gas. Small bilateral pleural effusions are present. IMPRESSION: Gastrostomy catheter overlying its expected position, particularly in relation to the pre-existing T tacks. Intraluminal position of the catheter, however, cannot be determined in absence of administration of contrast. Electronically Signed   By: Fidela Salisbury MD   On: 09/09/2020 22:25   DG Abd 1 View  Result Date: 09/08/2020 CLINICAL DATA:  Abdominal distension. Evaluate bowel pattern. Ileus. EXAM: ABDOMEN - 1 VIEW COMPARISON:  Sep 04, 2020 FINDINGS: Stable PEG tube. No free air, portal venous gas, or pneumatosis. Air-filled mildly prominent loops of bowel are identified, improved in the interval. No other interval changes. IMPRESSION: Improving ileus.  Recommend attention on follow-up. Electronically Signed   By: Dorise Bullion III M.D   On: 09/08/2020 09:55   DG Abd 1 View  Result Date: 09/04/2020 CLINICAL DATA:  Ileus. EXAM: ABDOMEN - 1 VIEW COMPARISON:  Sep 03, 2020. FINDINGS: Gastrostomy tube is again noted in the left upper quadrant. Mildly dilated large and small bowel loops are again noted suggesting ileus. No radio-opaque calculi or other significant radiographic abnormality are seen. IMPRESSION: Stable findings consistent with ileus. Electronically Signed   By: Marijo Conception M.D.   On: 09/04/2020 13:30   CT Head Wo Contrast  Result Date: 08/29/2020 CLINICAL DATA:  Decreased oxygen saturation. Rule out cerebral hemorrhage. EXAM: CT HEAD WITHOUT CONTRAST TECHNIQUE: Contiguous axial images were obtained from the base of the skull through the vertex without intravenous contrast. COMPARISON:  03/19/2020 FINDINGS: Brain: Age advanced cerebral atrophy. Moderate and age advanced low density in the periventricular white matter likely related to small vessel  disease. No mass lesion, hemorrhage, hydrocephalus, acute infarct, intra-axial, or extra-axial fluid collection. Vascular: Intracranial atherosclerosis. Skull: Normal Sinuses/Orbits: Normal imaged portions of the orbits and globes. Clear paranasal sinuses and mastoid air cells. Aerated right petrous apex. Other: None. IMPRESSION: 1. No acute intracranial abnormality. 2. Cerebral atrophy and small vessel ischemic change. Electronically Signed   By: Abigail Miyamoto M.D.   On: 08/29/2020 17:48   IR Replc Gastro/Colonic Tube Percut W/Fluoro  Result Date: 09/12/2020 INDICATION: 55 year old female with nonfunctional gastric tube EXAM: GASTROSTOMY CATHETER REPLACEMENT MEDICATIONS: None ANESTHESIA/SEDATION: None CONTRAST:  82mL OMNIPAQUE IOHEXOL 300 MG/ML SOLN - administered into the gastric lumen. FLUOROSCOPY TIME:  Fluoroscopy Time: 0 minutes 12 seconds (11 mGy). COMPLICATIONS: None PROCEDURE: Informed written consent was  obtained from the patient and the patient's family after a thorough discussion of the procedural risks, benefits and alternatives. All questions were addressed. Maximal Sterile Barrier Technique was utilized including caps, mask, sterile gowns, sterile gloves, sterile drape, hand hygiene and skin antiseptic. A timeout was performed prior to the initiation of the procedure. The epigastrium was prepped with Betadine in a sterile fashion, and a sterile drape was applied covering the operative field. A sterile gown and sterile gloves were used for the procedure. The place holder Foley catheter was removed and a new 14 French balloon retention gastrostomy was placed through the existing ostomy site. Balloon was inflated with saline. Contrast was injected confirming location within the stomach. Patient tolerated the procedure well and remained hemodynamically stable throughout. No complications were encountered and no significant blood loss encountered. IMPRESSION: Replacement of a displaced percutaneous  gastrostomy tube with a new balloon retention tube through the existing site. Signed, Dulcy Fanny. Earleen Newport, DO [facility] Electronically Signed   By: Corrie Mckusick D.O.   On: 09/12/2020 16:13   DG Chest Port 1 View  Result Date: 09/14/2020 CLINICAL DATA:  Acute respiratory failure EXAM: PORTABLE CHEST 1 VIEW COMPARISON:  09/07/2020 FINDINGS: Single frontal view of the chest demonstrates stable tracheostomy tube. Cardiac silhouette is unremarkable. Stable background emphysema with right basilar scarring. No acute airspace disease, effusion, or pneumothorax. No acute bony abnormalities. IMPRESSION: 1. Stable exam, no acute process. Electronically Signed   By: Randa Ngo M.D.   On: 09/14/2020 23:46   DG Chest Port 1 View  Result Date: 09/07/2020 CLINICAL DATA:  Respiratory failure EXAM: PORTABLE CHEST 1 VIEW COMPARISON:  09/04/2020 FINDINGS: The lungs are symmetrically hyperinflated in keeping with changes of underlying COPD. Mild right basilar scarring is again noted. No superimposed confluent pulmonary infiltrate. No pneumothorax or pleural effusion. Cardiac size within normal limits. Tracheostomy unchanged. No acute bone abnormality. IMPRESSION: Stable examination. COPD. No radiographic evidence of acute cardiopulmonary disease. Electronically Signed   By: Fidela Salisbury MD   On: 09/07/2020 05:49   DG CHEST PORT 1 VIEW  Result Date: 09/04/2020 CLINICAL DATA:  Dyspnea EXAM: PORTABLE CHEST 1 VIEW COMPARISON:  Radiograph 08/29/2020, CT 02/28/2020 FINDINGS: Tracheostomy tube tip terminates mid to upper trachea approximately 8 cm from the carina. Telemetry leads and external support devices overlie the chest. Chronically coarsened interstitial changes and regions of scarring in both lungs are similar to comparison exam. Blunting of bilateral costophrenic sulci may reflect a combination of hyperinflation and scarring seen on comparison images few  nodular opacities in the right upper lobe are unchanged radiographically. No new consolidative process or convincing features of acute pulmonary edema. No pneumothorax. Stable cardiomediastinal contours with a calcified aorta. No acute osseous or soft tissue abnormality. IMPRESSION: No acute cardiopulmonary abnormality. Chronic hyperinflation, coarsened interstitial changes and scarring, similar to prior. Blunting of the costophrenic sulci, favor scarring and hyperinflation as well. Effusions less favored. Stable nodular opacities in the right upper lobe. Aortic Atherosclerosis (ICD10-I70.0). Electronically Signed   By: Lovena Le M.D.   On: 09/04/2020 20:27   DG Chest Port 1 View  Result Date: 08/29/2020 CLINICAL DATA:  Shortness of breath EXAM: PORTABLE CHEST 1 VIEW COMPARISON:  08/01/2020, CT from 02/28/2020 FINDINGS: Tracheostomy tube is again seen and stable. Cardiac shadow is within normal limits. The lungs are hyperinflated but stable. Some scarring is noted in the right base. Small nodular changes are noted in the right apex stable from the previous exam.  No new focal infiltrate is seen. IMPRESSION: Stable nodular changes in the right apex unchanged from prior CT. Hyperinflation consistent with COPD. Electronically Signed   By: Inez Catalina M.D.   On: 08/29/2020 16:44   DG Abd Portable 1V  Result Date: 09/15/2020 CLINICAL DATA:  Abdominal distention. EXAM: PORTABLE ABDOMEN - 1 VIEW COMPARISON:  09/12/2020.  CT, 09/02/2020. FINDINGS: Generalized increase in bowel gas, but no bowel distension to suggest obstruction or significant adynamic ileus. Left upper quadrant gastrostomy tube appears well positioned, and is stable. IMPRESSION: 1. No acute findings.  No evidence of bowel obstruction. 2. Mild generalized increase in bowel gas. Electronically Signed   By: Lajean Manes M.D.   On: 09/15/2020 13:03   DG Abd Portable 1V  Result Date: 09/12/2020 CLINICAL DATA:  55 year old female with ileus. EXAM:  PORTABLE ABDOMEN - 1 VIEW COMPARISON:  Abdominal radiograph dated 09/03/2020. FINDINGS: Fifteen the gastrostomy in similar position. Interval decrease in the previously seen dilated small bowel loops. Air is noted throughout the colon. No free air or radiopaque calculi. The osseous structures are intact. IMPRESSION: Interval decrease in the previously seen dilated small bowel loops. No evidence of obstruction. Electronically Signed   By: Anner Crete M.D.   On: 09/12/2020 17:36   DG Abd Portable 1V  Result Date: 09/03/2020 CLINICAL DATA:  Ileus. EXAM: PORTABLE ABDOMEN - 1 VIEW COMPARISON:  Aug 31, 2020. FINDINGS: Mildly dilated and air-filled large and small bowel is noted concerning for ileus. No radiopaque calculi are noted. Gastrostomy tube is noted in left upper quadrant. IMPRESSION: Stable findings concerning for ileus. Gastrostomy tube seen in left upper quadrant. Electronically Signed   By: Marijo Conception M.D.   On: 09/03/2020 09:40   DG Abd Portable 1V  Result Date: 08/31/2020 CLINICAL DATA:  Abdominal distension with nausea and vomiting. EXAM: PORTABLE ABDOMEN - 1 VIEW COMPARISON:  CT abdomen pelvis dated Aug 29, 2020. FINDINGS: Percutaneous gastrostomy tube again noted. Progressive diffuse gaseous distention of small bowel and colon. No radio-opaque calculi or other significant radiographic abnormality are seen. IMPRESSION: 1. Findings suggestive of ileus. Electronically Signed   By: Titus Dubin M.D.   On: 08/31/2020 17:50        The results of significant diagnostics from this hospitalization (including imaging, microbiology, ancillary and laboratory) are listed below for reference.     Microbiology: No results found for this or any previous visit (from the past 240 hour(s)).   Labs:  CBC: Recent Labs  Lab 09/12/20 0133 09/12/20 2123 09/13/20 2876 09/14/20 2325 09/15/20 0147 09/15/20 0334 09/16/20 0010 09/16/20 1435 09/16/20 2258 09/16/20 2330 09/17/20 0145  09/18/20 0039  WBC 7.0  --  10.2  --  9.1  --  8.0  --   --   --   --  7.4  HGB 7.3*   < > 9.0*   < > 8.3*   < > 7.4* 9.4* 7.1* 7.1* 7.7* 7.6*  HCT 24.1*   < > 29.4*   < > 28.4*   < > 25.1* 31.6* 23.2* 21.0* 25.5* 25.7*  MCV 85.5  --  84.7  --  87.4  --  87.8  --   --   --   --  86.5  PLT 260  --  233  --  276  --  257  --   --   --   --  265   < > = values in this interval not displayed.   BMP &GFR Recent  Labs  Lab 09/13/20 5636 09/14/20 2325 09/15/20 0147 09/15/20 0201 09/15/20 0334 09/16/20 0010 09/16/20 2330 09/17/20 0145 09/18/20 0039  NA 137   < >  --  141 140 139 139 138 137  K 4.2   < >  --  3.9 3.8 4.6 4.7 4.9 4.2  CL 97*  --   --  95*  --  94*  --  92* 92*  CO2 30  --   --  36*  --  39*  --  38* 37*  GLUCOSE 99  --   --  130*  --  157*  --  155* 147*  BUN 16  --   --  13  --  24*  --  38* 41*  CREATININE 1.03*  --   --  0.67  --  1.11*  --  1.28* 1.25*  CALCIUM 9.7  --   --  10.1  --  9.7  --  9.9 9.6  MG 1.8  --  2.0  --   --  2.2  --  2.4 2.4  PHOS 4.6  --   --  2.6  --   --   --  3.1 2.6   < > = values in this interval not displayed.   Estimated Creatinine Clearance: 41 mL/min (A) (by C-G formula based on SCr of 1.25 mg/dL (H)). Liver & Pancreas: Recent Labs  Lab 09/13/20 0212 09/15/20 0201 09/16/20 0010 09/17/20 0145 09/18/20 0039  AST  --   --  28  --   --   ALT  --   --  15  --   --   ALKPHOS  --   --  66  --   --   BILITOT  --   --  0.4  --   --   PROT  --   --  6.4*  --   --   ALBUMIN 2.5* 2.6* 2.3* 2.4* 2.5*   Recent Labs  Lab 09/16/20 0010  LIPASE 25   No results for input(s): AMMONIA in the last 168 hours. Diabetic: No results for input(s): HGBA1C in the last 72 hours. Recent Labs  Lab 09/17/20 1539 09/17/20 1909 09/17/20 2315 09/18/20 0312 09/18/20 0731  GLUCAP 140* 156* 153* 127* 119*   Cardiac Enzymes: No results for input(s): CKTOTAL, CKMB, CKMBINDEX, TROPONINI in the last 168 hours. No results for input(s): PROBNP in the  last 8760 hours. Coagulation Profile: No results for input(s): INR, PROTIME in the last 168 hours. Thyroid Function Tests: Recent Labs    09/16/20 1435  TSH 71.026*   Lipid Profile: No results for input(s): CHOL, HDL, LDLCALC, TRIG, CHOLHDL, LDLDIRECT in the last 72 hours. Anemia Panel: No results for input(s): VITAMINB12, FOLATE, FERRITIN, TIBC, IRON, RETICCTPCT in the last 72 hours. Urine analysis:    Component Value Date/Time   COLORURINE YELLOW 08/29/2020 1645   APPEARANCEUR HAZY (A) 08/29/2020 1645   LABSPEC 1.009 08/29/2020 1645   PHURINE 8.0 08/29/2020 1645   GLUCOSEU 50 (A) 08/29/2020 1645   HGBUR NEGATIVE 08/29/2020 1645   BILIRUBINUR NEGATIVE 08/29/2020 1645   KETONESUR NEGATIVE 08/29/2020 1645   PROTEINUR 100 (A) 08/29/2020 1645   UROBILINOGEN 1.0 11/12/2008 1440   NITRITE NEGATIVE 08/29/2020 1645   LEUKOCYTESUR NEGATIVE 08/29/2020 1645   Sepsis Labs: Invalid input(s): PROCALCITONIN, LACTICIDVEN   Time coordinating discharge: 45 minutes  SIGNED:  Almon Hercules, MD  [facility] 09/18/2020, 8:17 AM  If 7PM-7AM, please contact night-coverage [email or web address]

## 2020-09-18 NOTE — Plan of Care (Signed)

## 2020-09-18 NOTE — Progress Notes (Signed)
Rt Note:  Patient continues on home Trilogy vent tolerating well.  Slightly higher peak pressures noted along with slight variance with home settings. Of note: Pressure alarm elevated, others in off position per home settings.

## 2020-09-18 NOTE — TOC Transition Note (Signed)
Transition of Care [facility]) - CM/SW Discharge Note   Patient Details  Name: Kathleen Cameron MRN: [id number] Date of Birth: [birth year]  Transition of Care [facility]) CM/SW Contact:  Epifanio Lesches, RN Phone Number: 09/18/2020, 3:00 PM  Clinical Narrative:    Patient will DC to: home  Anticipated DC date: 09/18/2020 Family notified: yes Transport by: Sharin Mons   Per MD patient ready for DC today. RN, patient, patient's husband, [name]/ Rob and [facility] RT/ Ryan notified of DC. Discharge Summary and [facility] orders faxed to [facility] @ [phone].  Transport papers on front of chart.  Ambulance transport requested for patient, time pick up for 3pm, husband to accompany in ambulance.   RNCM will sign off for now as intervention is no longer needed. Please consult [place] again if new needs arise.    Final next level of care: Home w Home Health Services Barriers to Discharge: No Barriers Identified   Patient Goals and CMS Choice        Discharge Placement                       Discharge Plan and Services                                     Social Determinants of Health (SDOH) Interventions     Readmission Risk Interventions Readmission Risk Prevention Plan 02/02/2020  Transportation Screening Complete  PCP or Specialist Appt within 3-5 Days Complete  HRI or Home Care Consult Complete  Social Work Consult for Recovery Care Planning/Counseling Complete  Palliative Care Screening Not Applicable  Medication Review [employer]) Complete  Some recent data might be hidden

## 2020-09-18 NOTE — Progress Notes (Signed)
NAME:  Kathleen Cameron, MRN:  [id number], DOB:  February 09, 1966, LOS: 20 ADMISSION DATE:  08/29/2020, CONSULTATION DATE:  08/29/2020 REFERRING MD:  Dr. Allena Katz CHIEF COMPLAINT:  SOB  History of Present Illness:  55 yo female presented to ED with dyspnea and hypoxia with SpO2 85%.  She had fever and found to have mucus plugging.  Started on antibiotics.  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  She is followed in trach clinic [name].  She has hx of respiratory arrest from mucus plugging in the setting of Corynebacterium pneumonia.  Pertinent Medical History:  Anxiety, HTN, Seizures  Significant Hospital Events:  . 5/11 Increasing SOB, presented to [facility] ED, code sepsis called, Pip-tazo>, Vanc> flagyl x1, head ct neg, CT abd pelvis grossly unremarkable, UC> , BC> . 5/12 Anxious overnight, improved with Xanax.  . 5/15 surgery consulted for abdominal pain . 5/16 GI consulted . 5/20 anxiety driven worsening respiratory mechanics; hypotension, mild AKI . 5/21 better anxiety control . 5/23 PEG fell out . 5/25 PEG replaced . 5/30 Episode of hypotension briefly requiring pressors. Separate episode of transient bradycardia - self resolved . 5/31 no overnight events, tolerated home trilogy ventilator  Tests:   CT head 5/11 >> atrophy and small vessel ischemic changes  CT abd/pelvis 5/11 >> distended stomach  CT abd/pelvis 5/15 >> no acute findings  Interim History / Subjective:  No overnight events No pressor requirement Has no concerns  Objective   Blood pressure 115/87, pulse (!) 107, temperature 97.7 F (36.5 C), temperature source Oral, resp. rate (!) 21, height 5\' 1"  (1.549 m), weight 59 kg, SpO2 100 %.    Vent Mode: AC FiO2 (%):  [40 %] 40 % Set Rate:  [16 bmp] 16 bmp Vt Set:  [420 mL-480 mL] 480 mL PEEP:  [5 cmH20] 5 cmH20 Plateau Pressure:  [16 cmH20-19 cmH20] 18 cmH20   Intake/Output Summary (Last 24 hours) at 09/18/2020  0849 Last data filed at 09/18/2020 09/20/2020 Gross per 24 hour  Intake 1100 ml  Output 1075 ml  Net 25 ml   Filed Weights   09/16/20 0500 09/17/20 0500 09/18/20 0500  Weight: 58.6 kg 61.6 kg 59 kg   Physical Exam: General: Chronically ill-appearing, no acute events, tolerating orally HENT: Moist oral mucosa Neck: Tracheostomy in place Eyes: No scleral icterus Respiratory: Coarse breath sounds, clear Cardiovascular: S1-S2 appreciated, no murmur Extremities: No edema Neuro: Awake, alert and following commands Resolved Hospital Problem list   AKI Hypotension  Assessment & Plan:  Acute on chronic hypoxic respiratory failure from tracheobronchitis, mucous plugging -Completed antibiotics  Severe COPD with emphysema Ventilator dependent -Plan is to discharge home on home trilogy ventilator which she has been on for about 6 months -Routine trach care -To follow-up with Dr. 09/20/20, [name] in trach clinic  Transient hypotension, bradycardia -On amlodipine  Anxiety disorder and panic attacks -As needed clonazepam  Ileus: Improving PEG malfunction: -Had PEG replaced 5/25  Hypothyroidism -Continue Synthroid  History of seizure disorder -Controlled benzodiazepine  Left subclavian DVT -Completed anticoagulation  Has had recurrent admissions -Family continues to provide for care at home -Does not want to go to an LTAC -Ventilator management at home   Best practice:  Diet: PO diet DVT prophylaxis: lovenox GI prophylaxis: Protonix Mobility: as tolerated Code Status:  full code Disposition: Per primary team  For discharge today Did discuss with Dr. 6/25 and spoke with spouse by phone Tolerated home ventilator  for 24 hours  Virl Diamond, MD [place] [facility] Pager: [phone]

## 2020-09-20 ENCOUNTER — Telehealth: Payer: Self-pay | Admitting: Emergency Medicine

## 2020-09-20 NOTE — Telephone Encounter (Signed)
   Iva from [facility] calling to report patient is refusing Nutritional Supplements (FEEDING SUPPLEMENT, OSMOLITE 1.2 CAL,) LIQD    Please call Iva [phone]

## 2020-09-21 NOTE — Telephone Encounter (Signed)
See message below.thanks

## 2020-09-22 ENCOUNTER — Other Ambulatory Visit: Payer: Self-pay | Admitting: Family Medicine

## 2020-09-22 NOTE — Telephone Encounter (Signed)
Cannot force it if she does not want to take it.  Find out about the reasons for refusal. Thanks.

## 2020-09-22 NOTE — Progress Notes (Unsigned)
Called by home health staff about changing simethicone to 125mg  tab daily instead of 40mg  liquid QID.  That change is needed per patient/family request due to cost and appears to be reasonable substitution.    Also gave order for tylenol 500mg  1-2 tabs PO/per tube TID prn pain.    Per report, she is taking food by mouth and can take meds per tube if needed.

## 2020-09-24 ENCOUNTER — Telehealth: Payer: Self-pay | Admitting: Emergency Medicine

## 2020-09-24 NOTE — Telephone Encounter (Signed)
[facility] FYI 6.3.22-6.4.22:   --Caller states that wife was released from hospital after being treated of an ileus and she is now having abdominal pain again. No vomiting, nausea or diarrhea. Pain is 7/10 that is a throbbing pain. She took some gas release medication and it is not helping  Advised to see PCP within 4 hours   ALSO:  ---Caller says pain meds were not filled at the pharmacy. Caller was instructed this would need to be taken care of during regular business hours. Home Care staff says there is no order for Tylenol and they cant give it with out an order.

## 2020-09-25 ENCOUNTER — Other Ambulatory Visit: Payer: Self-pay | Admitting: Internal Medicine

## 2020-09-25 ENCOUNTER — Telehealth: Payer: Self-pay | Admitting: Emergency Medicine

## 2020-09-25 DIAGNOSIS — Z930 Tracheostomy status: Secondary | ICD-10-CM

## 2020-09-25 DIAGNOSIS — J9611 Chronic respiratory failure with hypoxia: Secondary | ICD-10-CM

## 2020-09-25 NOTE — Telephone Encounter (Signed)
Dr Alvy Bimler, I spoke to Iva at [facility] yesterday and the first week the patient refused the feeding tube. On the following Saturday patient wanted to try it again. Also, when the patient does the neb treatment she refuses Brovanna and start labored breathing with the other neb treatments. Per Iva, she starts treatments without mention because patient again starts the labored breathing she does not want it.

## 2020-09-25 NOTE — Telephone Encounter (Signed)
Sounds like home care staff is requesting an order for Tylenol.  It is okay to give Tylenol as needed every 4-6 hours.  Thanks.

## 2020-09-25 NOTE — Telephone Encounter (Signed)
Get the correct order request and then fax it back please.  Thanks.

## 2020-09-25 NOTE — Telephone Encounter (Signed)
Iva w/ [facility] called and said that they are needing a order for Osmolite 1.2 cal supplement. She said that the patients order is for 1,017mL continuous. She said that it needs to be faxed to [facility]. Please advise    Fax: [phone]

## 2020-09-25 NOTE — Telephone Encounter (Signed)
Okay, thank you!

## 2020-09-26 NOTE — Telephone Encounter (Signed)
   Per Iva at [facility] please re- fax orders to [phone]

## 2020-09-27 ENCOUNTER — Telehealth: Payer: Self-pay | Admitting: *Deleted

## 2020-09-27 NOTE — Telephone Encounter (Signed)
Thanks

## 2020-09-27 NOTE — Telephone Encounter (Signed)
Spoke to Iva [facility]), on Saturday Dr Para March gave an order for Tylenol 500 mg 1-2 tablets every 4-6 hours for pain. This is FYI, thanks.

## 2020-09-27 NOTE — Telephone Encounter (Signed)
Signed orders faxed to [facility] to [employer]. Confirmation rec'd OK.

## 2020-09-30 ENCOUNTER — Encounter: Payer: Self-pay | Admitting: Emergency Medicine

## 2020-10-01 ENCOUNTER — Inpatient Hospital Stay (HOSPITAL_COMMUNITY): Payer: Medicaid Other

## 2020-10-01 ENCOUNTER — Observation Stay (HOSPITAL_COMMUNITY)
Admission: EM | Admit: 2020-10-01 | Discharge: 2020-10-02 | Disposition: A | Discharge status: Home / Self Care | Payer: Medicaid Other | Attending: Internal Medicine | Admitting: Internal Medicine

## 2020-10-01 ENCOUNTER — Telehealth: Payer: Self-pay | Admitting: Primary Care

## 2020-10-01 ENCOUNTER — Emergency Department (HOSPITAL_COMMUNITY): Payer: Medicaid Other

## 2020-10-01 DIAGNOSIS — N189 Chronic kidney disease, unspecified: Secondary | ICD-10-CM | POA: Insufficient documentation

## 2020-10-01 DIAGNOSIS — J9600 Acute respiratory failure, unspecified whether with hypoxia or hypercapnia: Secondary | ICD-10-CM | POA: Diagnosis present

## 2020-10-01 DIAGNOSIS — J9503 Malfunction of tracheostomy stoma: Secondary | ICD-10-CM | POA: Diagnosis not present

## 2020-10-01 DIAGNOSIS — Z20822 Contact with and (suspected) exposure to covid-19: Secondary | ICD-10-CM | POA: Insufficient documentation

## 2020-10-01 DIAGNOSIS — J9602 Acute respiratory failure with hypercapnia: Secondary | ICD-10-CM | POA: Diagnosis not present

## 2020-10-01 DIAGNOSIS — M542 Cervicalgia: Secondary | ICD-10-CM | POA: Diagnosis present

## 2020-10-01 DIAGNOSIS — I129 Hypertensive chronic kidney disease with stage 1 through stage 4 chronic kidney disease, or unspecified chronic kidney disease: Secondary | ICD-10-CM | POA: Diagnosis not present

## 2020-10-01 DIAGNOSIS — J449 Chronic obstructive pulmonary disease, unspecified: Secondary | ICD-10-CM | POA: Diagnosis not present

## 2020-10-01 DIAGNOSIS — J45909 Unspecified asthma, uncomplicated: Secondary | ICD-10-CM | POA: Insufficient documentation

## 2020-10-01 DIAGNOSIS — J9601 Acute respiratory failure with hypoxia: Secondary | ICD-10-CM | POA: Insufficient documentation

## 2020-10-01 DIAGNOSIS — E039 Hypothyroidism, unspecified: Secondary | ICD-10-CM | POA: Diagnosis not present

## 2020-10-01 DIAGNOSIS — Z79899 Other long term (current) drug therapy: Secondary | ICD-10-CM | POA: Diagnosis not present

## 2020-10-01 LAB — CBC
HCT: 29.6 % — ABNORMAL LOW (ref 36.0–46.0)
Hemoglobin: 8.9 g/dL — ABNORMAL LOW (ref 12.0–15.0)
MCH: 25.2 pg — ABNORMAL LOW (ref 26.0–34.0)
MCHC: 30.1 g/dL (ref 30.0–36.0)
MCV: 83.9 fL (ref 80.0–100.0)
Platelets: 332 10*3/uL (ref 150–400)
RBC: 3.53 MIL/uL — ABNORMAL LOW (ref 3.87–5.11)
RDW: 16.2 % — ABNORMAL HIGH (ref 11.5–15.5)
WBC: 12.7 10*3/uL — ABNORMAL HIGH (ref 4.0–10.5)
nRBC: 0 % (ref 0.0–0.2)

## 2020-10-01 LAB — CBC WITH DIFFERENTIAL/PLATELET
Abs Immature Granulocytes: 0.06 10*3/uL (ref 0.00–0.07)
Basophils Absolute: 0.1 10*3/uL (ref 0.0–0.1)
Basophils Relative: 1 %
Eosinophils Absolute: 0.4 10*3/uL (ref 0.0–0.5)
Eosinophils Relative: 4 %
HCT: 32.7 % — ABNORMAL LOW (ref 36.0–46.0)
Hemoglobin: 9.6 g/dL — ABNORMAL LOW (ref 12.0–15.0)
Immature Granulocytes: 1 %
Lymphocytes Relative: 17 %
Lymphs Abs: 2 10*3/uL (ref 0.7–4.0)
MCH: 24.7 pg — ABNORMAL LOW (ref 26.0–34.0)
MCHC: 29.4 g/dL — ABNORMAL LOW (ref 30.0–36.0)
MCV: 84.3 fL (ref 80.0–100.0)
Monocytes Absolute: 0.5 10*3/uL (ref 0.1–1.0)
Monocytes Relative: 4 %
Neutro Abs: 8.6 10*3/uL — ABNORMAL HIGH (ref 1.7–7.7)
Neutrophils Relative %: 73 %
Platelets: 407 10*3/uL — ABNORMAL HIGH (ref 150–400)
RBC: 3.88 MIL/uL (ref 3.87–5.11)
RDW: 16.5 % — ABNORMAL HIGH (ref 11.5–15.5)
WBC: 11.6 10*3/uL — ABNORMAL HIGH (ref 4.0–10.5)
nRBC: 0 % (ref 0.0–0.2)

## 2020-10-01 LAB — BASIC METABOLIC PANEL
Anion gap: 11 (ref 5–15)
BUN: 15 mg/dL (ref 6–20)
CO2: 32 mmol/L (ref 22–32)
Calcium: 10.6 mg/dL — ABNORMAL HIGH (ref 8.9–10.3)
Chloride: 97 mmol/L — ABNORMAL LOW (ref 98–111)
Creatinine, Ser: 0.91 mg/dL (ref 0.44–1.00)
GFR, Estimated: >60 mL/min (ref >60)
Glucose, Bld: 110 mg/dL — ABNORMAL HIGH (ref 70–99)
Potassium: 3.8 mmol/L (ref 3.5–5.1)
Sodium: 140 mmol/L (ref 135–145)

## 2020-10-01 LAB — RESP PANEL BY RT-PCR (FLU A&B, COVID) ARPGX2
Influenza A by PCR: NEGATIVE
Influenza B by PCR: NEGATIVE
SARS Coronavirus 2 by RT PCR: NEGATIVE

## 2020-10-01 LAB — GLUCOSE, CAPILLARY: Glucose-Capillary: 185 mg/dL — ABNORMAL HIGH (ref 70–99)

## 2020-10-01 LAB — CREATININE, SERUM
Creatinine, Ser: 1.27 mg/dL — ABNORMAL HIGH (ref 0.44–1.00)
GFR, Estimated: 49 mL/min — ABNORMAL LOW (ref >60)

## 2020-10-01 LAB — MRSA NEXT GEN BY PCR, NASAL: MRSA by PCR Next Gen: DETECTED — AB

## 2020-10-01 LAB — PROCALCITONIN: Procalcitonin: 4.06 ng/mL

## 2020-10-01 MED ORDER — OSMOLITE 1.2 CAL PO LIQD
1000.0000 mL | ORAL | Status: DC
  Administered 2020-10-01: 1000 mL
  Filled 2020-10-01: qty 1000

## 2020-10-01 MED ORDER — SIMETHICONE 40 MG/0.6ML PO SUSP
40.0000 mg | Freq: Four times a day (QID) | ORAL | Status: DC
  Administered 2020-10-01 – 2020-10-02 (×3): 40 mg
  Filled 2020-10-01 (×5): qty 0.6

## 2020-10-01 MED ORDER — DOCUSATE SODIUM 50 MG/5ML PO LIQD
100.0000 mg | Freq: Two times a day (BID) | ORAL | Status: DC
  Administered 2020-10-02: 100 mg
  Filled 2020-10-01: qty 10

## 2020-10-01 MED ORDER — TEMAZEPAM 15 MG PO CAPS
15.0000 mg | ORAL_CAPSULE | Freq: Every evening | ORAL | Status: DC | PRN
  Administered 2020-10-01: 15 mg
  Filled 2020-10-01: qty 1

## 2020-10-01 MED ORDER — DOCUSATE SODIUM 100 MG PO CAPS: 100.0000 mg | ORAL_CAPSULE | Freq: Two times a day (BID) | ORAL | Status: DC | PRN

## 2020-10-01 MED ORDER — POLYETHYLENE GLYCOL 3350 17 G PO PACK: 17.0000 g | PACK | Freq: Every day | ORAL | Status: DC | PRN

## 2020-10-01 MED ORDER — METOCLOPRAMIDE HCL 5 MG/5ML PO SOLN
5.0000 mg | Freq: Three times a day (TID) | ORAL | Status: DC
  Administered 2020-10-01 – 2020-10-02 (×2): 5 mg
  Filled 2020-10-01: qty 5
  Filled 2020-10-01 (×4): qty 10
  Filled 2020-10-01: qty 5

## 2020-10-01 MED ORDER — FENTANYL CITRATE (PF) 100 MCG/2ML IJ SOLN: 50.0000 ug | INTRAMUSCULAR | Status: DC | PRN

## 2020-10-01 MED ORDER — PANTOPRAZOLE SODIUM 40 MG PO PACK
40.0000 mg | PACK | Freq: Every day | ORAL | Status: DC
  Administered 2020-10-01: 40 mg
  Filled 2020-10-01 (×3): qty 20

## 2020-10-01 MED ORDER — OXYCODONE-ACETAMINOPHEN 5-325 MG PO TABS: 1.0000 | ORAL_TABLET | Freq: Four times a day (QID) | ORAL | Status: DC | PRN

## 2020-10-01 MED ORDER — POLYETHYLENE GLYCOL 3350 17 G PO PACK
17.0000 g | PACK | Freq: Every day | ORAL | Status: DC
  Administered 2020-10-02: 17 g
  Filled 2020-10-01: qty 1

## 2020-10-01 MED ORDER — IPRATROPIUM-ALBUTEROL 0.5-2.5 (3) MG/3ML IN SOLN: 3.0000 mL | RESPIRATORY_TRACT | Status: DC

## 2020-10-01 MED ORDER — BUDESONIDE 0.25 MG/2ML IN SUSP
0.2500 mg | Freq: Two times a day (BID) | RESPIRATORY_TRACT | Status: DC
  Administered 2020-10-01 – 2020-10-02 (×2): 0.25 mg via RESPIRATORY_TRACT
  Filled 2020-10-01 (×2): qty 2

## 2020-10-01 MED ORDER — METHYLPREDNISOLONE SODIUM SUCC 125 MG IJ SOLR
125.0000 mg | Freq: Once | INTRAMUSCULAR | Status: AC
  Administered 2020-10-01: 125 mg via INTRAVENOUS
  Filled 2020-10-01: qty 2

## 2020-10-01 MED ORDER — ARFORMOTEROL TARTRATE 15 MCG/2ML IN NEBU
15.0000 ug | INHALATION_SOLUTION | Freq: Two times a day (BID) | RESPIRATORY_TRACT | Status: DC
  Administered 2020-10-01 – 2020-10-02 (×2): 15 ug via RESPIRATORY_TRACT
  Filled 2020-10-01 (×3): qty 2

## 2020-10-01 MED ORDER — CHLORHEXIDINE GLUCONATE CLOTH 2 % EX PADS
6.0000 | MEDICATED_PAD | Freq: Every day | CUTANEOUS | Status: DC
  Administered 2020-10-01 – 2020-10-02 (×2): 6 via TOPICAL

## 2020-10-01 MED ORDER — IPRATROPIUM-ALBUTEROL 0.5-2.5 (3) MG/3ML IN SOLN
RESPIRATORY_TRACT | Status: AC
  Filled 2020-10-01: qty 3

## 2020-10-01 MED ORDER — REVEFENACIN 175 MCG/3ML IN SOLN
175.0000 ug | Freq: Every day | RESPIRATORY_TRACT | Status: DC
  Administered 2020-10-02: 175 ug via RESPIRATORY_TRACT
  Filled 2020-10-01: qty 3

## 2020-10-01 MED ORDER — ALBUTEROL (5 MG/ML) CONTINUOUS INHALATION SOLN
10.0000 mg/h | INHALATION_SOLUTION | RESPIRATORY_TRACT | Status: DC
  Administered 2020-10-01: 10 mg/h via RESPIRATORY_TRACT
  Filled 2020-10-01: qty 20

## 2020-10-01 MED ORDER — CLONAZEPAM 0.5 MG PO TABS
0.5000 mg | ORAL_TABLET | Freq: Three times a day (TID) | ORAL | Status: DC | PRN
  Administered 2020-10-01: 0.5 mg
  Filled 2020-10-01: qty 1

## 2020-10-01 MED ORDER — ADULT MULTIVITAMIN W/MINERALS CH
1.0000 | ORAL_TABLET | Freq: Every day | ORAL | Status: DC
  Administered 2020-10-02: 1
  Filled 2020-10-01: qty 1

## 2020-10-01 MED ORDER — SORBITOL 70 % SOLN
30.0000 mL | Freq: Every day | Status: DC | PRN
  Filled 2020-10-01: qty 30

## 2020-10-01 MED ORDER — LEVOTHYROXINE SODIUM 88 MCG PO TABS
88.0000 ug | ORAL_TABLET | Freq: Every day | ORAL | Status: DC
  Administered 2020-10-02: 88 ug
  Filled 2020-10-01 (×2): qty 1

## 2020-10-01 MED ORDER — VENLAFAXINE HCL 37.5 MG PO TABS
37.5000 mg | ORAL_TABLET | Freq: Two times a day (BID) | ORAL | Status: DC
  Administered 2020-10-02: 37.5 mg
  Filled 2020-10-01 (×2): qty 1

## 2020-10-01 MED ORDER — LORAZEPAM 2 MG/ML IJ SOLN
0.5000 mg | Freq: Once | INTRAMUSCULAR | Status: AC
  Administered 2020-10-01: 0.5 mg via INTRAVENOUS
  Filled 2020-10-01: qty 1

## 2020-10-01 MED ORDER — AMLODIPINE BESYLATE 10 MG PO TABS
10.0000 mg | ORAL_TABLET | Freq: Every day | ORAL | Status: DC
  Administered 2020-10-02: 10 mg
  Filled 2020-10-01: qty 1

## 2020-10-01 MED ORDER — HEPARIN SODIUM (PORCINE) 5000 UNIT/ML IJ SOLN
5000.0000 [IU] | Freq: Three times a day (TID) | INTRAMUSCULAR | Status: DC
  Administered 2020-10-02: 5000 [IU] via SUBCUTANEOUS
  Filled 2020-10-01: qty 1

## 2020-10-01 MED ORDER — SALINE SPRAY 0.65 % NA SOLN
1.0000 | NASAL | Status: DC | PRN
  Filled 2020-10-01 (×3): qty 44

## 2020-10-01 MED ORDER — IPRATROPIUM-ALBUTEROL 0.5-2.5 (3) MG/3ML IN SOLN
3.0000 mL | RESPIRATORY_TRACT | Status: DC
  Administered 2020-10-01 – 2020-10-02 (×4): 3 mL via RESPIRATORY_TRACT
  Filled 2020-10-01 (×5): qty 3

## 2020-10-01 MED ORDER — SODIUM CHLORIDE 0.9 % IV SOLN: INTRAVENOUS | Status: DC

## 2020-10-01 MED ORDER — ROSUVASTATIN CALCIUM 20 MG PO TABS
40.0000 mg | ORAL_TABLET | Freq: Every day | ORAL | Status: DC
  Administered 2020-10-02: 40 mg
  Filled 2020-10-01: qty 2

## 2020-10-01 MED ORDER — SENNOSIDES-DOCUSATE SODIUM 8.6-50 MG PO TABS: 2.0000 | ORAL_TABLET | Freq: Every evening | ORAL | Status: DC | PRN

## 2020-10-01 MED ORDER — OXYCODONE-ACETAMINOPHEN 5-325 MG PO TABS
1.0000 | ORAL_TABLET | Freq: Four times a day (QID) | ORAL | Status: DC | PRN
  Administered 2020-10-01 – 2020-10-02 (×2): 1
  Filled 2020-10-01 (×2): qty 1

## 2020-10-01 NOTE — Procedures (Signed)
Bronchoscopy Procedure Note  Neesa Knapik  [id number]  1965/11/20  Date:10/01/20  Time:5:17 PM   Provider Performing:Mahnoor Mathisen C Sotero Brinkmeyer   Procedure(s):  Flexible Bronchoscopy [phone])  Indication(s) Tracheostomy check  Consent Unable to obtain consent due to emergent nature of procedure.  Anesthesia Ativan 1 mg   Time Out Verified patient identification, verified procedure, site/side was marked, verified correct patient position, special equipment/implants available, medications/allergies/relevant history reviewed, required imaging and test results available.   Sterile Technique Usual hand hygiene, masks, gowns, and gloves were used   Procedure Description Bronchoscope advanced through existing trach into airway, seen to be high-riding and abutting posterior trachea likely cause of currently issues (posterior membranous trachea pushing out 6-0 shiley).  New longer 8-0 single cannula shiley placed by [name] and better positioning noted with bronchoscope post procedure.    Complications/Tolerance Mild oozing from tracheostomy site Chest X-ray is not needed post procedure.   EBL Minimal   Specimen(s) none

## 2020-10-01 NOTE — Progress Notes (Signed)
eLink Physician-Brief Progress Note Patient Name: Vasilisa Vore DOB: 1965-08-07 MRN: [id number]   Date of Service  10/01/2020  HPI/Events of Note  RN asks for clarification of tube feeding rate. Patient is here after trach exchange with plan for discharge tomorrow, but does not know her home tube feeding rate.   eICU Interventions  Will run Osmolite 1.2 @ 30 mL/hr overnight.     Intervention Category Minor Interventions: Routine modifications to care plan (e.g. PRN medications for pain, fever)  Marveen Reeks Abelino Tippin 10/01/2020, 9:32 PM

## 2020-10-01 NOTE — ED Provider Notes (Signed)
  Face-to-face evaluation   History: She presents for evaluation of sensation of difficulty with tracheostomy with discomfort and a feeling that she is not getting enough air.  Her husband is following her oxygen saturations at home and they have been 97 to 100%.  He also helps her with suctioning.  She has not had to see pulmonary recently.  She is chronically on a ventilator, prior tracheostomy.  She is not having fever, vomiting or other stated complaints.  She cannot verbalize because she is on a ventilator.  Physical exam: Alert, calm, she has a wide-eyed look, and appears uncomfortable.  Lungs with good air movement bilaterally, no audible wheezes rales or rhonchi.  She is currently on ventilator, and the tracheostomy cannula cannulation device appears normal.  There is a small amount of blood, noted in the suction device which has been used to clear her tracheostomy.  Medical screening examination/treatment/procedure(s) were conducted as a shared visit with non-physician practitioner(s) and myself.  I personally evaluated the patient during the encounter    Mancel Bale, MD 10/06/20 1158

## 2020-10-01 NOTE — Telephone Encounter (Signed)
   Spouse calling back, states his wife has a sore throat. He states he is going to call EMS

## 2020-10-01 NOTE — Progress Notes (Signed)
eLink Physician-Brief Progress Note Patient Name: Kathleen Cameron DOB: May 28, 1965 MRN: [id number]   Date of Service  10/01/2020  HPI/Events of Note  33F with COPD s/p tracheostomy who presented earlier today and required emergent trach change due to difficulty ventilating through it (was abutting the posterior membrane of the trachea).  She is now in the ICU for monitoring overnight. Stable vitals at this time. She is alert and oriented. No pain at this time.   eICU Interventions  # Respiratory: Vent Mode: PRVC FiO2 (%):  [40 %-100 %] 80 % Set Rate:  [16 bmp] 16 bmp Vt Set:  [480 mL] 480 mL PEEP:  [5 cmH20] 5 cmH20 Plateau Pressure:  [18 cmH20-34 cmH20] 21 cmH20 - S/p trach change on 6/13.  DVT PPX: Heparin [place] [facility] PPX: Protonix   Remainder of plan as per H&P.     Intervention Category Evaluation Type: New Patient Evaluation  Janae Bridgeman 10/01/2020, 8:34 PM

## 2020-10-01 NOTE — ED Notes (Signed)
IV team at bedside 

## 2020-10-01 NOTE — ED Notes (Signed)
Spouse updated as to patient's status.

## 2020-10-01 NOTE — Progress Notes (Signed)
RT called to Pt bedside for desat. Audible leak through trach  and pt not getting adequate volumes though the vent. RT added air to the cuff and pt continued to not get adequate volumes with the audible leak and diminished breath sounds. MD at bedside, okay to change trach. Pt trach switched out to a new #6 Shiley cuffed. No complications noted RT assist x2. Good color change with CO2, and new trach ties secured. MD order for 10mg  continuous neb. RN aware. RT will continue to monitor.

## 2020-10-01 NOTE — ED Provider Notes (Addendum)
[facility] EMERGENCY DEPARTMENT Provider Note   CSN: [id number] Arrival date & time: 10/01/20  1050     History Chief Complaint  Patient presents with   Sore Throat    Kathleen Cameron is a 55 y.o. female.  Kathleen Cameron is a 55 y.o. female with a history of hypertension, asthma, COPD, chronic respiratory failure with trach, vent dependent, seizures and anxiety, who presents to the emergency department accompanied by her husband, complaining of pain in her neck around her trach site, shortness of breath and increased mucus production.  Husband reports that over the past 2 days she has been complaining of throat pain and pain around her trach site on her neck.  He also reports that they have been having to suction her more often, they have been getting up a lot of mucus and over the past 2 days he has also noticed some blood clots when suctioning.  No bright red blood.  Patient is not on any blood thinners.  She denies associated chest pain just reports pain around her trach but does report feeling some shortness of breath.  No fevers or chills.  No known sick contacts.  Has been admitted for sepsis and pneumonia multiple times.  Has had trach in place since November and remains vent dependent.  Husband also reports some diarrhea today but does report that they have been increasing her laxatives recently, no blood in the stool, no abdominal pain or vomiting.  The history is provided by the patient, the spouse and medical records.      Past Medical History:  Diagnosis Date   Acute on chronic respiratory failure with hypoxia (HCC)    Anxiety disorder    Asthma    COPD (chronic obstructive pulmonary disease) (HCC)    COPD, severe (HCC)    Hypertension    Seizure disorder [facility])     Patient Active Problem List   Diagnosis Date Noted   Intractable nausea and vomiting    Severe sepsis with septic shock (CODE) ([place]) 08/29/2020   Aspiration pneumonia (HCC)     Goals of care, counseling/discussion    Ileus ([place])    Tracheostomy status ([place])    Sepsis ([place]) 06/03/2020   Healthcare-associated pneumonia 06/03/2020   Pressure injury of skin 06/03/2020   Acute respiratory failure (HCC)    Seizure disorder (HCC)    COPD, severe (HCC)    Anxiety disorder    Generalized abdominal pain    Convulsions (HCC)    Malnutrition of moderate degree 02/27/2020   Hypotension    Acute hypercapnic respiratory failure ([place]) 02/25/2020   Right upper quadrant abdominal pain 02/01/2020   Hypothyroidism (acquired) 96/75/9163   Acute metabolic encephalopathy 84/66/5993   Hyponatremia 01/28/2020   Anxiety 12/02/2019   COPD exacerbation ([place]) 11/06/2019   Depression 11/06/2019   Acute on chronic respiratory failure with hypoxia and hypercapnia (HCC) 11/06/2019   Current moderate episode of major depressive disorder without prior episode ([place]) 09/20/2019   Pedal edema 08/04/2019   Hypokalemia 04/13/2019   Chronic respiratory failure with hypoxia ([place]) 03/07/2019   CKD (chronic kidney disease) 01/05/2019   COPD (chronic obstructive pulmonary disease) ([place]) 09/29/2018   Pulmonary nodules 09/27/2018   Dyslipidemia 07/07/2018   Essential hypertension 03/10/2018    Past Surgical History:  Procedure Laterality Date   COLONOSCOPY N/A 06/26/2020   Procedure: COLONOSCOPY;  Surgeon: Carol Ada, MD;  Location: [place];  Service: Endoscopy;  Laterality: N/A;   ENTEROSCOPY N/A 06/26/2020   Procedure:  ENTEROSCOPY;  Surgeon: Carol Ada, MD;  Location: [place];  Service: Endoscopy;  Laterality: N/A;   HEMOSTASIS CLIP PLACEMENT  06/26/2020   Procedure: HEMOSTASIS CLIP PLACEMENT;  Surgeon: Carol Ada, MD;  Location: [place];  Service: Endoscopy;;   HOT HEMOSTASIS N/A 06/26/2020   Procedure: HOT HEMOSTASIS (ARGON PLASMA COAGULATION/BICAP);  Surgeon: Carol Ada, MD;  Location: [place];  Service: Endoscopy;  Laterality: N/A;   IR FLUORO RM 30-60 MIN   03/23/2020   IR REPLC GASTRO/COLONIC TUBE PERCUT W/FLUORO  09/12/2020   LAPAROSCOPIC INSERTION GASTROSTOMY TUBE N/A 04/24/2020   Procedure: LAPAROSCOPIC GASTROSTOMY TUBE PLACEMENT;  Surgeon: Mickeal Skinner, MD;  Location: [place];  Service: General;  Laterality: N/A;   POLYPECTOMY  06/26/2020   Procedure: POLYPECTOMY;  Surgeon: Carol Ada, MD;  Location: [facility] ENDOSCOPY;  Service: Endoscopy;;   WISDOM TOOTH EXTRACTION       OB History   No obstetric history on file.     Family History  Family history unknown: Yes    Social History   Tobacco Use   Smoking status: Never   Smokeless tobacco: Never  Vaping Use   Vaping Use: Never used  Substance Use Topics   Alcohol use: Yes    Alcohol/week: 2.0 standard drinks    Types: 2 Cans of beer per week   Drug use: Never    Home Medications Prior to Admission medications   Medication Sig Start Date End Date Taking? Authorizing Provider  amLODipine (NORVASC) 10 MG tablet Place 1 tablet (10 mg total) into feeding tube daily. 09/18/20   Mercy Riding, MD  arformoterol (BROVANA) 15 MCG/2ML NEBU Take 2 mLs (15 mcg total) by nebulization 2 (two) times daily. 07/10/20   Candee Furbish, MD  atorvastatin (LIPITOR) 40 MG tablet Take 40 mg by mouth at bedtime. 09/10/20   [provider]  bisacodyl (DULCOLAX) 10 MG suppository Place 1 suppository (10 mg total) rectally daily as needed for severe constipation. 07/10/20   Horald Pollen, MD  budesonide (PULMICORT) 0.25 MG/2ML nebulizer solution Take 2 mLs (0.25 mg total) by nebulization 2 (two) times daily. 07/10/20   Candee Furbish, MD  Chlorhexidine Gluconate Cloth 2 % PADS Apply 6 each topically daily. 08/21/20   Horald Pollen, MD  chlorhexidine gluconate, MEDLINE KIT, (PERIDEX) 0.12 % solution 15 mLs by Mouth Rinse route 2 (two) times daily. 07/10/20   Candee Furbish, MD  clonazePAM (KLONOPIN) 1 MG tablet Place 1/2 tablet (0.5 mg total) into feeding tube 3 (three) times daily as  needed for anxiety. 08/29/20   Horald Pollen, MD  ipratropium-albuterol (DUONEB) 0.5-2.5 (3) MG/3ML SOLN Take 3 mLs by nebulization every 4 (four) hours. 07/22/20   [provider]  levothyroxine (SYNTHROID) 88 MCG tablet Take 1 tablet (88 mcg total) by mouth daily at 6 (six) AM. 09/18/20   Mercy Riding, MD  lip balm (CARMEX) ointment Apply topically as needed for lip care. 03/08/20   Candee Furbish, MD  metoCLOPramide (REGLAN) 10 MG/10ML SOLN PLACE 10 MLS INTO FEEDING TUBE EVERY MORNING AND AT BEDTIME 09/25/20   Collene Gobble, MD  Multiple Vitamin (MULTIVITAMIN WITH MINERALS) TABS tablet Place 1 tablet into feeding tube daily. 07/03/20 10/01/20  Kayleen Memos, DO  Nutritional Supplements (ENSURE ACTIVE HIGH PROTEIN) LIQD Take 1 Can by mouth in the morning and at bedtime. 07/09/20   Barb Merino, MD  Nutritional Supplements (FEEDING SUPPLEMENT, OSMOLITE 1.2 CAL,) LIQD Place 1,000 mLs into feeding tube  continuous. 07/10/20   Horald Pollen, MD  oxyCODONE-acetaminophen (PERCOCET/ROXICET) 5-325 MG tablet PLACE 1 TABLET INTO FEEDING TUBE EVERY EIGHT HOURS AS NEEDED FOR UP TO 3 DAYS FOR SEVERE PAIN. Patient taking differently: Take 1 tablet by mouth every 6 (six) hours as needed for moderate pain. 07/02/20 12/29/20  Kayleen Memos, DO  pantoprazole sodium (PROTONIX) 40 mg/20 mL PACK Place 20 mLs (40 mg total) into feeding tube at bedtime. 08/24/20   Horald Pollen, MD  phenol (CHLORASEPTIC) 1.4 % LIQD Use as directed 1 spray in the mouth or throat as needed for throat irritation / pain. 03/08/20   Candee Furbish, MD  polyethylene glycol (MIRALAX / GLYCOLAX) 17 g packet Place 17 g into feeding tube daily as needed for mild constipation. 03/08/20   Candee Furbish, MD  PULMICORT 0.5 MG/2ML nebulizer solution Take by nebulization 2 (two) times daily. 09/10/20   [provider]  revefenacin (YUPELRI) 175 MCG/3ML nebulizer solution Take 3 mLs (175 mcg total) by nebulization  daily. 07/10/20   Candee Furbish, MD  rosuvastatin (CRESTOR) 40 MG tablet Place 1 tablet (40 mg total) into feeding tube daily. 07/10/20   Horald Pollen, MD  senna-docusate (SENOKOT-S) 8.6-50 MG tablet Place 2 tablets into feeding tube at bedtime as needed for moderate constipation. 03/08/20   Candee Furbish, MD  simethicone (MYLICON) 40 QJ/3.3LK drops Place 0.6 mLs (40 mg total) into feeding tube every 6 (six) hours. 09/18/20   Mercy Riding, MD  sodium chloride (OCEAN) 0.65 % SOLN nasal spray Place 1 spray into both nostrils as needed for congestion. 03/08/20   Candee Furbish, MD  sorbitol 70 % SOLN Place 30 mLs into feeding tube daily as needed for moderate constipation. 07/25/20   Candee Furbish, MD  temazepam (RESTORIL) 15 MG capsule Place 1 capsule (15 mg total) into feeding tube at bedtime as needed for sleep. 07/20/20   Horald Pollen, MD  venlafaxine [facility]) 37.5 MG tablet Place 1 tablet (37.5 mg total) into feeding tube 2 (two) times daily with a meal. 07/20/20 10/18/20  Sagardia, Ines Bloomer, MD    Allergies    Stiolto respimat [tiotropium bromide-olodaterol]  Review of Systems   Review of Systems  Constitutional:  Negative for chills and fever.  HENT:  Positive for sore throat. Negative for congestion.   Respiratory:  Positive for cough and shortness of breath.   Cardiovascular:  Negative for chest pain and leg swelling.  Gastrointestinal:  Positive for diarrhea. Negative for abdominal pain, blood in stool, nausea and vomiting.  Musculoskeletal:  Negative for myalgias.  Skin:  Negative for color change and rash.  Neurological:  Negative for dizziness, syncope and light-headedness.  All other systems reviewed and are negative.  Physical Exam Updated Vital Signs BP (!) 142/92 (BP Location: Right Arm)   Pulse (!) 115   Temp 99.3 F (37.4 C) (Oral)   Resp 20   SpO2 97%   Physical Exam Vitals and nursing note reviewed.  Constitutional:      General: She is not  in acute distress.    Appearance: Normal appearance. She is ill-appearing. She is not diaphoretic.     Comments: Chronically ill-appearing, no acute distress  HENT:     Head: Normocephalic and atraumatic.     Mouth/Throat:     Comments: Posterior oropharynx clear and moist without erythema, edema or exudates, tolerating secretions Eyes:     General:  Right eye: No discharge.        Left eye: No discharge.     Pupils: Pupils are equal, round, and reactive to light.  Neck:     Comments: Tracheostomy present, patient has some discomfort to palpation around the trach site without palpable swelling, erythema or deformity Cardiovascular:     Rate and Rhythm: Regular rhythm. Tachycardia present.     Pulses: Normal pulses.     Heart sounds: Normal heart sounds.     Comments: Tachycardia with regular rhythm Pulmonary:     Effort: Pulmonary effort is normal. No respiratory distress.     Breath sounds: Normal breath sounds. No wheezing or rales.     Comments: Trach, vent dependent On arrival no tachypnea, on home FiO2 level, lungs with some diminished breath sounds but no wheezes, rhonchi or rales Abdominal:     General: Bowel sounds are normal. There is no distension.     Palpations: Abdomen is soft. There is no mass.     Tenderness: There is no abdominal tenderness. There is no guarding.     Comments: Abdomen soft, nondistended, nontender to palpation in all quadrants without guarding or peritoneal signs  Musculoskeletal:        General: No deformity.     Cervical back: Neck supple.  Skin:    General: Skin is warm and dry.     Capillary Refill: Capillary refill takes less than 2 seconds.  Neurological:     Mental Status: She is alert and oriented to person, place, and time.     Coordination: Coordination normal.     Comments: Speech is clear, able to follow commands Moves extremities without ataxia, coordination intact  Psychiatric:        Mood and Affect: Mood normal.         Behavior: Behavior normal.    ED Results / Procedures / Treatments   Labs (all labs ordered are listed, but only abnormal results are displayed) Labs Reviewed  BASIC METABOLIC PANEL - Abnormal; Notable for the following components:      Result Value   Chloride 97 (*)    Glucose, Bld 110 (*)    Calcium 10.6 (*)    All other components within normal limits  CBC WITH DIFFERENTIAL/PLATELET - Abnormal; Notable for the following components:   WBC 11.6 (*)    Hemoglobin 9.6 (*)    HCT 32.7 (*)    MCH 24.7 (*)    MCHC 29.4 (*)    RDW 16.5 (*)    Platelets 407 (*)    Neutro Abs 8.6 (*)    All other components within normal limits  RESP PANEL BY RT-PCR (FLU A&B, COVID) ARPGX2  CULTURE, RESPIRATORY W GRAM STAIN  CBC  CREATININE, SERUM  PROCALCITONIN  CBC  BASIC METABOLIC PANEL    EKG None  Radiology DG Chest Port 1 View  Result Date: 10/01/2020 CLINICAL DATA:  Endotracheal tube placement EXAM: PORTABLE CHEST 1 VIEW COMPARISON:  10/01/2020 FINDINGS: An endotracheal tube is present with tip above the carina. Heart size and pulmonary vascularity are normal. Emphysematous changes and scattered fibrosis in the lungs. Calcified granulomas in the lung apices. No pleural effusion or pneumothorax. Mediastinal contours appear intact. Calcification of the aorta. IMPRESSION: Endotracheal tube appears in satisfactory position. Emphysematous changes and fibrosis in the lungs. Electronically Signed   By: Lucienne Capers M.D.   On: 10/01/2020 17:55   DG Chest Port 1 View  Result Date: 10/01/2020 CLINICAL DATA:  Sore throat  with bloody mucus. EXAM: PORTABLE CHEST 1 VIEW COMPARISON:  Radiographs 09/14/2020 and 09/07/2020.  CT 02/28/2020. FINDINGS: 1140 hours. Tracheostomy appears well positioned. The heart size and mediastinal contours are stable. There are calcified mediastinal and hilar lymph nodes as demonstrated on previous CT. Probable small bilateral pleural effusions with associated bibasilar  atelectasis. Small calcified right upper lobe granulomas are stable. No edema or suspicious nodularity identified. IMPRESSION: 1. Stable sequela of prior granulomatous disease. 2. Small bilateral pleural effusions. Electronically Signed   By: Richardean Sale M.D.   On: 10/01/2020 11:50    Procedures .Critical Care  Date/Time: 10/02/2020 1:03 PM Performed by: Jacqlyn Larsen, PA-C Authorized by: Jacqlyn Larsen, PA-C   Critical care provider statement:    Critical care time (minutes):  45   Critical care was necessary to treat or prevent imminent or life-threatening deterioration of the following conditions:  Respiratory failure   Critical care was time spent personally by me on the following activities:  Discussions with consultants, evaluation of patient's response to treatment, examination of patient, ordering and performing treatments and interventions, ordering and review of laboratory studies, ordering and review of radiographic studies, pulse oximetry, re-evaluation of patient's condition, obtaining history from patient or surrogate and review of old charts   Care discussed with: admitting provider     Medications Ordered in ED Medications  albuterol (PROVENTIL,VENTOLIN) solution continuous neb (10 mg/hr Nebulization New Bag/Given 10/01/20 1514)  amLODipine (NORVASC) tablet 10 mg (has no administration in time range)  rosuvastatin (CRESTOR) tablet 40 mg (has no administration in time range)  temazepam (RESTORIL) capsule 15 mg (has no administration in time range)  venlafaxine (EFFEXOR) tablet 37.5 mg (has no administration in time range)  levothyroxine (SYNTHROID) tablet 88 mcg (has no administration in time range)  metoCLOPramide (REGLAN) 10 MG/10ML solution 5 mg (has no administration in time range)  pantoprazole sodium (PROTONIX) 40 mg/20 mL oral suspension 40 mg (has no administration in time range)  senna-docusate (Senokot-S) tablet 2 tablet (has no administration in time range)   simethicone (MYLICON) 40 UR/4.2HC suspension 40 mg (has no administration in time range)  Chlorhexidine Gluconate Cloth 2 % PADS 6 each (has no administration in time range)  sorbitol 70 % solution 30 mL (has no administration in time range)  clonazePAM (KLONOPIN) tablet 0.5 mg (has no administration in time range)  multivitamin with minerals tablet 1 tablet (has no administration in time range)  feeding supplement (OSMOLITE 1.2 CAL) liquid 1,000 mL (has no administration in time range)  arformoterol (BROVANA) nebulizer solution 15 mcg (has no administration in time range)  budesonide (PULMICORT) nebulizer solution 0.25 mg (has no administration in time range)  revefenacin (YUPELRI) nebulizer solution 175 mcg (has no administration in time range)  ipratropium-albuterol (DUONEB) 0.5-2.5 (3) MG/3ML nebulizer solution 3 mL (has no administration in time range)  sodium chloride (OCEAN) 0.65 % nasal spray 1 spray (has no administration in time range)  docusate sodium (COLACE) capsule 100 mg (has no administration in time range)  docusate (COLACE) 50 MG/5ML liquid 100 mg (has no administration in time range)  polyethylene glycol (MIRALAX / GLYCOLAX) packet 17 g (0 g Per Tube Hold 10/01/20 1746)  heparin injection 5,000 Units (has no administration in time range)  0.9 %  sodium chloride infusion (has no administration in time range)  fentaNYL (SUBLIMAZE) injection 50 mcg (has no administration in time range)  oxyCODONE-acetaminophen (PERCOCET/ROXICET) 5-325 MG per tablet 1 tablet (has no administration in time range)  LORazepam (ATIVAN)  injection 0.5 mg (0.5 mg Intravenous Given 10/01/20 1347)  ipratropium-albuterol (DUONEB) 0.5-2.5 (3) MG/3ML nebulizer solution (  Given 10/01/20 1511)  methylPREDNISolone sodium succinate (SOLU-MEDROL) 125 mg/2 mL injection 125 mg (125 mg Intravenous Given 10/01/20 1520)    ED Course  I have reviewed the triage vital signs and the nursing notes.  Pertinent labs &  imaging results that were available during my care of the patient were reviewed by me and considered in my medical decision making (see chart for details).    MDM Rules/Calculators/A&P                         55 year old female with tracheostomy, vent dependent, presents reporting pain around trach site for the past 2 days also with increasing cough and need for suction, husband reports bringing up some blood clots over the past few days which is not typical for the patient.  Not on anticoagulation.  On arrival she is on her home vent settings, respirations are unlabored but she does sound diminished bilaterally.  We will have respiratory come and assess and suction tracheostomy.  Patient has not been inflating cuff completely due to discomfort at trach site.  Chest x-ray done upon arrival which does not show obvious signs of pneumonia, some very small bilateral pleural effusions noted.  Concern for potential infection, given blood clots present on suction concern for potential pulmonary hemorrhage or PE as well.  Will get lab work, COVID test and CTA of the chest.  Respiratory suction patient and they were able to pull up another large blood clot, respiratory therapist noted patient while on her work this is atypical for her.  Patient also appears quite anxious and uncomfortable, small dose of Ativan given.  Respiratory having to return to bedside repeatedly for suctioning, patient becoming increasingly uncomfortable, and then started to drop her O2 sats into the 80s.  They noticed that cuff was no longer remaining inflated at all and patient's volumes were falling.  Decision made to change out patient's trach with myself and Dr. Eulis Foster at bedside with RT.  Cuff was inflated and O2 sats improved but patient is still requiring a higher FiO2 than typically at home.  She has increased work of breathing and is increasingly diminished.  Will treat with continuous albuterol and steroids given underlying lung  disease.  Lab work reviewed by myself, CBC with mild leukocytosis, hemoglobin slightly improved from baseline metabolic panel without concerning derangements, COVID and flu negative, sputum culture and gram stain pending.  CTA pending, but due to increased work of breathing and increasing oxygen requirement patient is not stable to go to CT at this time.  I discussed case with NP [name] with critical care and pulmonary, he notes patient has seen her once previously in the tracheostomy clinic, will come down to see and evaluate patient, given concern for bleeding, suspect this may be due to suction trauma on the posterior wall of the airway, patient may need bronchoscopy and may need a different style trach to help provide more flexibility and less trauma.  Pulmonary evaluated patient at bedside, performed bedside bronchoscopy and change patient to new trach.  They will plan to admit patient to their service for observation overnight.  Final Clinical Impression(s) / ED Diagnoses Final diagnoses:  Acute respiratory failure with hypoxia ([place])  Tracheostomy malfunction ([place])    Rx / DC Orders ED Discharge Orders     None  Jacqlyn Larsen, PA-C 10/05/20 1123    Jacqlyn Larsen, PA-C 10/05/20 1126    Daleen Bo, MD 10/06/20 1158

## 2020-10-01 NOTE — ED Notes (Signed)
Called floor to give report, charge from 51M, Ted Mcalpine, is reaching out to [facility] for clarification on bed placement due to staffing.

## 2020-10-01 NOTE — Progress Notes (Signed)
CCM at bedside to change trach. Trach changed to #8 single cannula cuffed. Pt tolerated well, trach ties secured. MD verified placement  with bronch. RN aware, RT will continue to monitor.

## 2020-10-01 NOTE — ED Notes (Signed)
Pulmonary at bedside with RT.

## 2020-10-01 NOTE — ED Notes (Signed)
RT at bedside.

## 2020-10-01 NOTE — ED Triage Notes (Signed)
Pt arrives via EMS from home with c/o sore throat and not able to completely cough up mucus that appears bloody

## 2020-10-01 NOTE — ED Notes (Addendum)
22g IV placed in hand, pt needs 20g for CT Angio, this RN requested RN with [place] to attempt placement. RT called for culture and flush of trach tube, currently waiting for arrival.

## 2020-10-01 NOTE — Telephone Encounter (Signed)
Spoke with the pt's spouse  He was very hard to understand, loud noises in the background  I asked if the pt was able to speak with me, since she has not listed anyone on DPR  He got angry and cursed at me and hung up the phone    Called spouse back and offered video visit with [facility] for tomorrow at 10 am  He refused stating that she is having trouble with her breathing now, and she is on her way to ED   Advised we are happy to see her for f/u once she is d/c  Nothing further needed

## 2020-10-01 NOTE — Procedures (Signed)
Tracheostomy Exchange Procedure Note  Sheneika Walstad  [id number]  [birth year]  Date:10/01/20  Time:5:21 PM   Provider Performing:Pete E Tanja Port   Procedure: Tracheostomy Exchange Through Immature Stoma (46950)  Indication(s) Trach occlusion and malposition   Consent Unable to obtain consent due to emergent nature of procedure.  Anesthesia None   Time Out Verified patient identification, verified procedure, site/side was marked, verified correct patient position, special equipment/implants available, medications/allergies/relevant history reviewed, required imaging and test results available.   Sterile Technique Hand hygiene, gloves   Procedure Description Size   cuffed existing Shiley removed and size 8 cuffed single lumen Shiley placed through stoma.   Complications/Tolerance None; patient tolerated the procedure well..   EBL Minimal  Simonne Martinet ACNP-BC [facility] Pulmonary/Critical Care Pager # [phone] OR # [phone] if no answer

## 2020-10-01 NOTE — H&P (Addendum)
NAME:  Kathleen Cameron, MRN:  [id number], DOB:  11-08-1965, LOS: 0 ADMISSION DATE:  10/01/2020, CONSULTATION DATE:  10/01/20 REFERRING MD:  EDP, CHIEF COMPLAINT:  SOB   History of Present Illness:  55 year old woman well known to our service for COPD complicated by recurrent exacerbations presenting from home with worsening SOB, throat pain, bloody secretions.  Difficulty with ventilation in ER through existing tracheostomy so PCCM consulted emergently.  Pertinent  Medical History  COPD HTN Seizures CKD Hypothryroidism  Significant Hospital Events: Including procedures, antibiotic start and stop dates in addition to other pertinent events   6/13 consult and emergent trach change  Interim History / Subjective:  Feels better with new trach and ativan, some peristomal pain from multiple changes.  Objective   Blood pressure 136/86, pulse (!) 127, temperature 99.3 F (37.4 C), temperature source Oral, resp. rate 16, SpO2 96 %.    Vent Mode: PRVC FiO2 (%):  [40 %-100 %] 100 % Set Rate:  [16 bmp] 16 bmp Vt Set:  [480 mL] 480 mL PEEP:  [5 cmH20] 5 cmH20 Plateau Pressure:  [18 cmH20-34 cmH20] 34 cmH20  No intake or output data in the 24 hours ending 10/01/20 1707 There were no vitals filed for this visit.  Examination: General: anxious woman on vent HENT: trach in place, keeps getting pushed out with coughing fits (pre change) Lungs: diminished bilaterally, tachypneic Cardiovascular: tachycardic, ext warm Abdomen: soft, +BS Extremities: no edema Neuro: moves all 4 ext to command Skin: no rashes  Labs/imaging that I havepersonally reviewed  (right click and "Reselect all SmartList Selections" daily)  CXR: clear, trach looks a bit high  Resolved Hospital Problem list   N/a  Assessment & Plan:  Acute on chronic hypoxemic respiratory failure due to trach malfunction- Kathleen Cameron and I exchanged her existing 6-0 shiley for 8-0 single use cuffed, which seems to fit her  tracheal and stomal anatomy a bit better.  No obvious signs of infection or COPD flare.   - Resume home meds - Work on getting her new size home trach supplies, once she does she can go back home, will update husband  Best practice (right click and "Reselect all SmartList Selections" daily)  Diet:  Oral Pain/Anxiety/Delirium protocol (if indicated): As ordered VAP protocol (if indicated): Yes DVT prophylaxis: SCD GI prophylaxis: PPI Glucose control:  n/a Central venous access:  N/A Arterial line:  N/A Foley:  N/A Mobility:  bed rest  PT consulted: N/A Last date of multidisciplinary goals of care discussion [6/13, usual course of treatment] Code Status:  full code Disposition: ICU pending home supplies  Labs   CBC: Recent Labs  Lab 10/01/20 1131  WBC 11.6*  NEUTROABS 8.6*  HGB 9.6*  HCT 32.7*  MCV 84.3  PLT 407*    Basic Metabolic Panel: Recent Labs  Lab 10/01/20 1131  NA 140  K 3.8  CL 97*  CO2 32  GLUCOSE 110*  BUN 15  CREATININE 0.91  CALCIUM 10.6*   GFR: Estimated Creatinine Clearance: 56.3 mL/min (by C-G formula based on SCr of 0.91 mg/dL). Recent Labs  Lab 10/01/20 1131  WBC 11.6*    Liver Function Tests: No results for input(s): AST, ALT, ALKPHOS, BILITOT, PROT, ALBUMIN in the last 168 hours. No results for input(s): LIPASE, AMYLASE in the last 168 hours. No results for input(s): AMMONIA in the last 168 hours.  ABG    Component Value Date/Time   PHART 7.511 (H) 09/16/2020 2330   PCO2ART 54.4 (  H) 09/16/2020 2330   PO2ART 139 (H) 09/16/2020 2330   HCO3 43.6 (H) 09/16/2020 2330   TCO2 45 (H) 09/16/2020 2330   O2SAT 99.0 09/16/2020 2330     Coagulation Profile: No results for input(s): INR, PROTIME in the last 168 hours.  Cardiac Enzymes: No results for input(s): CKTOTAL, CKMB, CKMBINDEX, TROPONINI in the last 168 hours.  HbA1C: Hgb A1c MFr Bld  Date/Time Value Ref Range Status  06/21/2020 02:24 AM 5.3 4.8 - 5.6 % Final    Comment:     (NOTE) Pre diabetes:          5.7%-6.4%  Diabetes:              >6.4%  Glycemic control for   <7.0% adults with diabetes   02/26/2020 05:18 PM 5.4 4.8 - 5.6 % Final    Comment:    (NOTE) Pre diabetes:          5.7%-6.4%  Diabetes:              >6.4%  Glycemic control for   <7.0% adults with diabetes     CBG: No results for input(s): GLUCAP in the last 168 hours.  Review of Systems:    Positive Symptoms in bold:  Constitutional fevers, chills, weight loss, fatigue, anorexia, malaise  Eyes decreased vision, double vision, eye irritation  Ears, Nose, Mouth, Throat sore throat, trouble swallowing, sinus congestion  Cardiovascular chest pain, paroxysmal nocturnal dyspnea, lower ext edema, palpitations   Respiratory SOB, cough, DOE, hemoptysis, wheezing  Gastrointestinal nausea, vomiting, diarrhea  Genitourinary burning with urination, trouble urinating  Musculoskeletal joint aches, joint swelling, back pain  Integumentary  rashes, skin lesions  Neurological focal weakness, focal numbness, trouble speaking, headaches  Psychiatric depression, anxiety, confusion  Endocrine polyuria, polydipsia, cold intolerance, heat intolerance  Hematologic abnormal bruising, abnormal bleeding, unexplained nose bleeds  Allergic/Immunologic recurrent infections, hives, swollen lymph nodes     Past Medical History:  She,  has a past medical history of Acute on chronic respiratory failure with hypoxia (HCC), Anxiety disorder, Asthma, COPD (chronic obstructive pulmonary disease) ([place]), COPD, severe ([place]), Hypertension, and Seizure disorder ([place]).   Surgical History:   Past Surgical History:  Procedure Laterality Date   COLONOSCOPY N/A 06/26/2020   Procedure: COLONOSCOPY;  Surgeon: Carol Ada, MD;  Location: [place];  Service: Endoscopy;  Laterality: N/A;   ENTEROSCOPY N/A 06/26/2020   Procedure: ENTEROSCOPY;  Surgeon: Carol Ada, MD;  Location: [place];  Service: Endoscopy;   Laterality: N/A;   HEMOSTASIS CLIP PLACEMENT  06/26/2020   Procedure: HEMOSTASIS CLIP PLACEMENT;  Surgeon: Carol Ada, MD;  Location: [place];  Service: Endoscopy;;   HOT HEMOSTASIS N/A 06/26/2020   Procedure: HOT HEMOSTASIS (ARGON PLASMA COAGULATION/BICAP);  Surgeon: Carol Ada, MD;  Location: [place];  Service: Endoscopy;  Laterality: N/A;   IR FLUORO RM 30-60 MIN  03/23/2020   IR REPLC GASTRO/COLONIC TUBE PERCUT W/FLUORO  09/12/2020   LAPAROSCOPIC INSERTION GASTROSTOMY TUBE N/A 04/24/2020   Procedure: LAPAROSCOPIC GASTROSTOMY TUBE PLACEMENT;  Surgeon: Mickeal Skinner, MD;  Location: [place];  Service: General;  Laterality: N/A;   POLYPECTOMY  06/26/2020   Procedure: POLYPECTOMY;  Surgeon: Carol Ada, MD;  Location: [place];  Service: Endoscopy;;   WISDOM TOOTH EXTRACTION       Social History:   reports that she has never smoked. She has never used smokeless tobacco. She reports current alcohol use of about 2.0 standard drinks of alcohol per week. She reports that she  does not use drugs.   Family History:  Her Family history is unknown by patient.   Allergies Allergies  Allergen Reactions   Stiolto Respimat [Tiotropium Bromide-Olodaterol] Other (See Comments)    Severe headaches and rapid heartrate     Home Medications  Prior to Admission medications   Medication Sig Start Date End Date Taking? Authorizing Provider  amLODipine (NORVASC) 10 MG tablet Place 1 tablet (10 mg total) into feeding tube daily. 09/18/20   Mercy Riding, MD  arformoterol (BROVANA) 15 MCG/2ML NEBU Take 2 mLs (15 mcg total) by nebulization 2 (two) times daily. 07/10/20   Candee Furbish, MD  bisacodyl (DULCOLAX) 10 MG suppository Place 1 suppository (10 mg total) rectally daily as needed for severe constipation. 07/10/20   Horald Pollen, MD  budesonide (PULMICORT) 0.25 MG/2ML nebulizer solution Take 2 mLs (0.25 mg total) by nebulization 2 (two) times daily. 07/10/20   Candee Furbish, MD   Chlorhexidine Gluconate Cloth 2 % PADS Apply 6 each topically daily. 08/21/20   Horald Pollen, MD  chlorhexidine gluconate, MEDLINE KIT, (PERIDEX) 0.12 % solution 15 mLs by Mouth Rinse route 2 (two) times daily. 07/10/20   Candee Furbish, MD  clonazePAM (KLONOPIN) 1 MG tablet Place 1/2 tablet (0.5 mg total) into feeding tube 3 (three) times daily as needed for anxiety. 08/29/20   Horald Pollen, MD  ipratropium-albuterol (DUONEB) 0.5-2.5 (3) MG/3ML SOLN Take 3 mLs by nebulization every 4 (four) hours. 07/22/20   [provider]  levothyroxine (SYNTHROID) 88 MCG tablet Take 1 tablet (88 mcg total) by mouth daily at 6 (six) AM. 09/18/20   Mercy Riding, MD  lip balm (CARMEX) ointment Apply topically as needed for lip care. 03/08/20   Candee Furbish, MD  metoCLOPramide (REGLAN) 10 MG/10ML SOLN PLACE 10 MLS INTO FEEDING TUBE EVERY MORNING AND AT BEDTIME 09/25/20   Collene Gobble, MD  Multiple Vitamin (MULTIVITAMIN WITH MINERALS) TABS tablet Place 1 tablet into feeding tube daily. 07/03/20 10/01/20  Kayleen Memos, DO  Nutritional Supplements (ENSURE ACTIVE HIGH PROTEIN) LIQD Take 1 Can by mouth in the morning and at bedtime. 07/09/20   Barb Merino, MD  Nutritional Supplements (FEEDING SUPPLEMENT, OSMOLITE 1.2 CAL,) LIQD Place 1,000 mLs into feeding tube continuous. 07/10/20   Horald Pollen, MD  oxyCODONE-acetaminophen (PERCOCET/ROXICET) 5-325 MG tablet PLACE 1 TABLET INTO FEEDING TUBE EVERY EIGHT HOURS AS NEEDED FOR UP TO 3 DAYS FOR SEVERE PAIN. Patient taking differently: Take 1 tablet by mouth every 6 (six) hours as needed for moderate pain. 07/02/20 12/29/20  Kayleen Memos, DO  pantoprazole sodium (PROTONIX) 40 mg/20 mL PACK Place 20 mLs (40 mg total) into feeding tube at bedtime. 08/24/20   Horald Pollen, MD  phenol (CHLORASEPTIC) 1.4 % LIQD Use as directed 1 spray in the mouth or throat as needed for throat irritation / pain. 03/08/20   Candee Furbish, MD  polyethylene  glycol (MIRALAX / GLYCOLAX) 17 g packet Place 17 g into feeding tube daily as needed for mild constipation. 03/08/20   Candee Furbish, MD  revefenacin (YUPELRI) 175 MCG/3ML nebulizer solution Take 3 mLs (175 mcg total) by nebulization daily. 07/10/20   Candee Furbish, MD  rosuvastatin (CRESTOR) 40 MG tablet Place 1 tablet (40 mg total) into feeding tube daily. 07/10/20   Horald Pollen, MD  senna-docusate (SENOKOT-S) 8.6-50 MG tablet Place 2 tablets into feeding tube at bedtime as needed for moderate constipation. 03/08/20  Candee Furbish, MD  simethicone [facility]) 40 WN/7.2OP drops Place 0.6 mLs (40 mg total) into feeding tube every 6 (six) hours. 09/18/20   Mercy Riding, MD  sodium chloride (OCEAN) 0.65 % SOLN nasal spray Place 1 spray into both nostrils as needed for congestion. 03/08/20   Candee Furbish, MD  sorbitol 70 % SOLN Place 30 mLs into feeding tube daily as needed for moderate constipation. 07/25/20   Candee Furbish, MD  temazepam (RESTORIL) 15 MG capsule Place 1 capsule (15 mg total) into feeding tube at bedtime as needed for sleep. 07/20/20   Horald Pollen, MD  venlafaxine [facility]) 37.5 MG tablet Place 1 tablet (37.5 mg total) into feeding tube 2 (two) times daily with a meal. 07/20/20 10/18/20  Horald Pollen, MD

## 2020-10-01 NOTE — Telephone Encounter (Signed)
   Spouse requesting return call

## 2020-10-02 ENCOUNTER — Telehealth: Payer: Self-pay | Admitting: Internal Medicine

## 2020-10-02 DIAGNOSIS — J441 Chronic obstructive pulmonary disease with (acute) exacerbation: Secondary | ICD-10-CM

## 2020-10-02 DIAGNOSIS — J9602 Acute respiratory failure with hypercapnia: Secondary | ICD-10-CM | POA: Diagnosis not present

## 2020-10-02 DIAGNOSIS — J9601 Acute respiratory failure with hypoxia: Secondary | ICD-10-CM | POA: Diagnosis not present

## 2020-10-02 LAB — BASIC METABOLIC PANEL
Anion gap: 13 (ref 5–15)
BUN: 25 mg/dL — ABNORMAL HIGH (ref 6–20)
CO2: 26 mmol/L (ref 22–32)
Calcium: 9.4 mg/dL (ref 8.9–10.3)
Chloride: 97 mmol/L — ABNORMAL LOW (ref 98–111)
Creatinine, Ser: 1.28 mg/dL — ABNORMAL HIGH (ref 0.44–1.00)
GFR, Estimated: 49 mL/min — ABNORMAL LOW (ref >60)
Glucose, Bld: 211 mg/dL — ABNORMAL HIGH (ref 70–99)
Potassium: 3.4 mmol/L — ABNORMAL LOW (ref 3.5–5.1)
Sodium: 136 mmol/L (ref 135–145)

## 2020-10-02 LAB — CBC
HCT: 28.5 % — ABNORMAL LOW (ref 36.0–46.0)
Hemoglobin: 8.6 g/dL — ABNORMAL LOW (ref 12.0–15.0)
MCH: 25 pg — ABNORMAL LOW (ref 26.0–34.0)
MCHC: 30.2 g/dL (ref 30.0–36.0)
MCV: 82.8 fL (ref 80.0–100.0)
Platelets: 294 10*3/uL (ref 150–400)
RBC: 3.44 MIL/uL — ABNORMAL LOW (ref 3.87–5.11)
RDW: 16.2 % — ABNORMAL HIGH (ref 11.5–15.5)
WBC: 12.7 10*3/uL — ABNORMAL HIGH (ref 4.0–10.5)
nRBC: 0 % (ref 0.0–0.2)

## 2020-10-02 LAB — GLUCOSE, CAPILLARY
Glucose-Capillary: 126 mg/dL — ABNORMAL HIGH (ref 70–99)
Glucose-Capillary: 96 mg/dL (ref 70–99)

## 2020-10-02 MED ORDER — ONDANSETRON HCL 4 MG/2ML IJ SOLN
INTRAMUSCULAR | Status: AC
  Administered 2020-10-02: 4 mg via INTRAVENOUS
  Filled 2020-10-02: qty 2

## 2020-10-02 MED ORDER — POTASSIUM CHLORIDE 20 MEQ PO PACK: 20.0000 meq | PACK | Freq: Once | ORAL | Status: DC

## 2020-10-02 MED ORDER — ONDANSETRON HCL 4 MG/2ML IJ SOLN: 4.0000 mg | Freq: Four times a day (QID) | INTRAMUSCULAR | Status: DC | PRN

## 2020-10-02 MED ORDER — POTASSIUM CHLORIDE 20 MEQ PO PACK
40.0000 meq | PACK | Freq: Once | ORAL | Status: AC
  Administered 2020-10-02: 40 meq
  Filled 2020-10-02: qty 2

## 2020-10-02 MED ORDER — MUPIROCIN 2 % EX OINT
1.0000 "application " | TOPICAL_OINTMENT | Freq: Two times a day (BID) | CUTANEOUS | Status: DC
  Administered 2020-10-02: 1 via NASAL
  Filled 2020-10-02: qty 22

## 2020-10-02 NOTE — Progress Notes (Addendum)
   NAME:  Kathleen Cameron, MRN:  [id number], DOB:  02/09/1966, LOS: 1 ADMISSION DATE:  10/01/2020, CONSULTATION DATE:  10/01/20 REFERRING MD:  EDP, CHIEF COMPLAINT:  SOB   History of Present Illness:  55 year old woman well known to our service for COPD complicated by recurrent exacerbations presenting from home with worsening SOB, throat pain, bloody secretions.  Difficulty with ventilation in ER through existing tracheostomy so PCCM consulted emergently.  Pertinent  Medical History  COPD HTN Seizures CKD Hypothryroidism  Significant Hospital Events: Including procedures, antibiotic start and stop dates in addition to other pertinent events   6/13 consult and emergent trach change  Interim History / Subjective:  No events.  Feels better with new trach.  Objective   Blood pressure 119/78, pulse 99, temperature 98.3 F (36.8 C), temperature source Oral, resp. rate 16, weight 52.5 kg, SpO2 99 %.    Vent Mode: PRVC FiO2 (%):  [40 %-100 %] 40 % Set Rate:  [16 bmp] 16 bmp Vt Set:  [480 mL] 480 mL PEEP:  [5 cmH20] 5 cmH20 Plateau Pressure:  [18 cmH20-34 cmH20] 25 cmH20   Intake/Output Summary (Last 24 hours) at 10/02/2020 0717 Last data filed at 10/02/2020 0600 Gross per 24 hour  Intake 605 ml  Output 0 ml  Net 605 ml   Filed Weights   10/02/20 0500  Weight: 52.5 kg    Examination: No distress Abdomen soft Diminished breath sounds Trach looks fine, small thick secretions   Labs/imaging that I havepersonally reviewed  (right click and "Reselect all SmartList Selections" daily)  Cr up a bit but yesterday's may have been erroneous; this is c/w 5/31 and previous labs Tube looks better seated on CXR  Resolved Hospital Problem list   N/a  Assessment & Plan:  Acute on chronic hypoxemic respiratory failure due to trach malfunction- Due to anatomy, switch to 8-0 single cannula cuffed. No obvious signs of infection or COPD flare.   - Continue home meds - Will write orders  for new trach type as well as RT evaluation weekly or PRN as this may prevent ER visits and hospitalizations - Will arrange close OP f/u with PB in trach clinic  Myrla Halsted MD PCCM

## 2020-10-02 NOTE — TOC Transition Note (Signed)
Transition of Care [facility]) - CM/SW Discharge Note   Patient Details  Name: Kathleen Cameron MRN: [id number] Date of Birth: Aug 21, 1965  [facility] CM/SW Contact:  Epifanio Lesches, RN Phone Number: 10/02/2020, 11:59 AM   Clinical Narrative:    Patient will DC to: home Anticipated DC date: 10/02/2020 Family notified: yes, husband Transport by: Sharin Mons      -S/p trach change on 6/13 2/2 trach occlusion and malposition   Hx of COPD, hypertension, Seizure, Hyponatremia, GAD, Tracheostomy, CKD, Hypothyroidism,Pulmonary Nodule.  Per MD patient ready for DC today. RN, patient, patient's  husband, [name], notified of DC. [facility] RT [phone]) , made aware of d/c.  Pt with # 8 trach and # 6 shiley  trach @ bedside to take home for back up. Ambulance transportation papers placed on front of chart and [facility] transport requested for patient. Husband to accompany pt in ambulance to assist with dme home ventilator. Resumption of home health Adult Nursing to begin once d/c with [facility]. NCM faxed  HH orders and d/c summary to  [facility] @ [phone].  RNCM will sign off for now as intervention is no longer needed. Please consult [place] again if new needs arise.   Final next level of care: Home w Home Health Services Barriers to Discharge: No Barriers Identified   Patient Goals and CMS Choice     Choice offered to / list presented to : Patient  Discharge Placement                       Discharge Plan and Services                          HH Arranged: RN [facility] Agency: [facility] (Adult Nursing) Date [facility] Agency Contacted: 10/02/20 Time HH Agency Contacted: 1159 Representative spoke with at [facility] Agency: Rob  Social Determinants of Health (SDOH) Interventions     Readmission Risk Interventions Readmission Risk Prevention Plan 02/02/2020  Transportation Screening Complete  PCP or Specialist Appt within 3-5 Days Complete  HRI or  Home Care Consult Complete  Social Work Consult for Recovery Care Planning/Counseling Complete  Palliative Care Screening Not Applicable  Medication Review [employer]) Complete  Some recent data might be hidden

## 2020-10-02 NOTE — Discharge Instructions (Addendum)
-   New tracheostomy tube seems to be working better.  It does not have an inner cannula so provides more room for you to breath and cough through. - We will arrange follow-up for you in our trach clinic with [name] - We will work with your home health company to try to get folks to help trouble shoot your vent and trach if you have issues

## 2020-10-02 NOTE — Progress Notes (Signed)
K 3.4 Electrolytes replaced per eLINK electrolyte replacement protocol 

## 2020-10-02 NOTE — Telephone Encounter (Signed)
Home Health Orders

## 2020-10-02 NOTE — Progress Notes (Signed)
Patient has a #8 shiley cuffed (single use) trach that was put in by CCM during the day. It does not have an inner cannula. Pt has some thick, bloody secretions. RT placed a spare #8 and #6 shiley trach at the bedside.

## 2020-10-02 NOTE — Progress Notes (Signed)
Initial Nutrition Assessment  DOCUMENTATION CODES:   Non-severe (moderate) malnutrition in context of chronic illness  INTERVENTION:  Patient being discharged home today. Recommend continue home TF regimen: Osmolite 1.5 2 cartons BID. Continue Ensure Enlive po BID, each supplement provides 350 kcal and 20 grams of protein Continue regular diet, with high protein meals and snacks.  NUTRITION DIAGNOSIS:   Moderate Malnutrition related to chronic illness (COPD, chronic trach with vent dependence) as evidenced by mild fat depletion, mild muscle depletion, moderate muscle depletion.  GOAL:   Patient will meet greater than or equal to 90% of their needs  MONITOR:   PO intake, Labs, Skin, TF tolerance  REASON FOR ASSESSMENT:   Ventilator, Consult Enteral/tube feeding initiation and management  ASSESSMENT:   55 yo female admitted with worsening SOB, throat pain, bloody secretions. PMH includes COPD, chronic trach on vent support, PEG, HTN, seizures, CKD, hypothyroidism.  Patient familiar from multiple ICU admissions.  She eats a regular diet and receives supplemental TF via PEG. Currently receiving Osmolite 1.2 at 30 ml/h via PEG providing 864 kcal, 40 gm protein, 590 ml free water daily.  Spoke with patient and her husband at bedside. Patient typically eats regular meals, drinks 2 Ensure supplements per day, and receives 2 containers of Osmolite 1.5 per day via PEG. Spoke with patient about continuing to include high protein foods with meals and snacks as well as drinking ensure BID and taking Osmolite 1.5 2-3 times per day via PEG.   Labs reviewed. K 3.4 CBG: 185-126  Medications reviewed and include colace, reglan, MVI with minerals, protonix, miralax, potassium chloride, Mylicon.   NUTRITION - FOCUSED PHYSICAL EXAM:  Flowsheet Row Most Recent Value  Orbital Region Mild depletion  Upper Arm Region Mild depletion  Thoracic and Lumbar Region Mild depletion  Buccal Region  No depletion  Temple Region Mild depletion  Clavicle Bone Region Mild depletion  Clavicle and Acromion Bone Region Mild depletion  Scapular Bone Region Mild depletion  Dorsal Hand Mild depletion  Patellar Region Moderate depletion  Anterior Thigh Region Moderate depletion  Posterior Calf Region Unable to assess  Edema (RD Assessment) Unable to assess  Hair Reviewed  Eyes Reviewed  Mouth Reviewed  Skin Reviewed  Nails Reviewed       Diet Order:   Diet Order             Diet regular Room service appropriate? Yes; Fluid consistency: Thin  Diet effective now                   EDUCATION NEEDS:   Education needs have been addressed  Skin:  Skin Assessment: Reviewed RN Assessment  Last BM:  6/12  Height:   Ht Readings from Last 1 Encounters:  08/29/20 5\' 1"  (1.549 m)    Weight:   Wt Readings from Last 1 Encounters:  10/02/20 52.5 kg    Ideal Body Weight:  47.7 kg  BMI:  Body mass index is 21.87 kg/m.  Estimated Nutritional Needs:   Kcal:  1800-2000  Protein:  80-100 gm  Fluid:  >/= 1.8 L    10/04/20, RD, LDN, CNSC Please refer to Amion for contact information.

## 2020-10-02 NOTE — Telephone Encounter (Signed)
Left message voice mail for Kathleen Cameron at [facility] to call back concerning Osmolite supplement order.

## 2020-10-02 NOTE — Discharge Summary (Signed)
Physician Discharge Summary       Patient ID: Kathleen Cameron MRN: [id number] DOB/AGE: June 01, 1965 55 y.o.  Admit date: 10/01/2020 Discharge date: 10/02/2020  Discharge Diagnoses:  Active Problems:   Acute respiratory failure (HCC)   History of Present Illness:  55 year old woman well known to our service for COPD complicated by recurrent exacerbations presenting from home with worsening SOB, throat pain, bloody secretions.  Difficulty with ventilation in ER through existing tracheostomy so PCCM consulted emergently.  Hospital Course:   PCCM was called to evaluate the patient in the ED. There were concerns for trach postioning, so she was immediately evaluated with bronchoscope. Bronchoscope advanced through existing trach into airway, seen to be high-riding and abutting posterior trachea likely cause of current issues (posterior membranous trachea pushing out 6-0 shiley).  New longer 8-0 single cannula shiley placed by [name] and better positioning noted with bronchoscope post procedure.   Discharge Plan by active problems   Acute on chronic hypoxemic respiratory failure due to trach malfunction - discharge to home with new 8-0 single lumen trach.  - Replacement trach provided to the patient - No changes to ventilator settings or medication regimen - Will arrange close OP f/u in our trach clinic     Discharge Exam: BP 124/88   Pulse (!) 114   Temp 97.6 F (36.4 C) (Oral)   Resp 20   Wt 52.5 kg   SpO2 99%   BMI 21.87 kg/m   Constitutional: no acute distress  Eyes: EOMI, pupils equal Ears, nose, mouth, and throat: new trach in place with minimal secretions Cardiovascular: RRR, ext warm Respiratory: Severely diminished, passive on vent Gastrointestinal: Mildly distended, +BS, PEG in place Skin: No rashes, normal turgor Neurologic: Moves all 4 ext to command, globally weak Psychiatric: Oriented and following commands   Labs at discharge Lab Results   Component Value Date   CREATININE 1.28 (H) 10/02/2020   BUN 25 (H) 10/02/2020   NA 136 10/02/2020   K 3.4 (L) 10/02/2020   CL 97 (L) 10/02/2020   CO2 26 10/02/2020   Lab Results  Component Value Date   WBC 12.7 (H) 10/02/2020   HGB 8.6 (L) 10/02/2020   HCT 28.5 (L) 10/02/2020   MCV 82.8 10/02/2020   PLT 294 10/02/2020   Lab Results  Component Value Date   ALT 15 09/16/2020   AST 28 09/16/2020   ALKPHOS 66 09/16/2020   BILITOT 0.4 09/16/2020   Lab Results  Component Value Date   INR 1.4 (H) 08/29/2020   INR 1.6 (H) 07/21/2020   INR 1.2 03/23/2020    Current radiology studies DG Chest Port 1 View  Result Date: 10/01/2020 CLINICAL DATA:  Endotracheal tube placement EXAM: PORTABLE CHEST 1 VIEW COMPARISON:  10/01/2020 FINDINGS: An endotracheal tube is present with tip above the carina. Heart size and pulmonary vascularity are normal. Emphysematous changes and scattered fibrosis in the lungs. Calcified granulomas in the lung apices. No pleural effusion or pneumothorax. Mediastinal contours appear intact. Calcification of the aorta. IMPRESSION: Endotracheal tube appears in satisfactory position. Emphysematous changes and fibrosis in the lungs. Electronically Signed   By: Lucienne Capers M.D.   On: 10/01/2020 17:55   DG Chest Port 1 View  Result Date: 10/01/2020 CLINICAL DATA:  Sore throat with bloody mucus. EXAM: PORTABLE CHEST 1 VIEW COMPARISON:  Radiographs 09/14/2020 and 09/07/2020.  CT 02/28/2020. FINDINGS: 1140 hours. Tracheostomy appears well positioned. The heart size and mediastinal contours are stable. There are calcified mediastinal  and hilar lymph nodes as demonstrated on previous CT. Probable small bilateral pleural effusions with associated bibasilar atelectasis. Small calcified right upper lobe granulomas are stable. No edema or suspicious nodularity identified. IMPRESSION: 1. Stable sequela of prior granulomatous disease. 2. Small bilateral pleural effusions.  Electronically Signed   By: Richardean Sale M.D.   On: 10/01/2020 11:50    Disposition:   There are no questions and answers to display.        Allergies as of 10/02/2020       Reactions   Stiolto Respimat [tiotropium Bromide-olodaterol] Other (See Comments)   Severe headaches and rapid heartrate        Medication List     STOP taking these medications    multivitamin with minerals Tabs tablet       TAKE these medications    amLODipine 10 MG tablet Commonly known as: NORVASC Place 1 tablet (10 mg total) into feeding tube daily.   arformoterol 15 MCG/2ML Nebu Commonly known as: BROVANA Take 2 mLs (15 mcg total) by nebulization 2 (two) times daily.   atorvastatin 40 MG tablet Commonly known as: LIPITOR Take 40 mg by mouth at bedtime.   bisacodyl 10 MG suppository Commonly known as: DULCOLAX Place 1 suppository (10 mg total) rectally daily as needed for severe constipation.   budesonide 0.25 MG/2ML nebulizer solution Commonly known as: PULMICORT Take 2 mLs (0.25 mg total) by nebulization 2 (two) times daily.   Pulmicort 0.5 MG/2ML nebulizer solution Generic drug: budesonide Take by nebulization 2 (two) times daily.   chlorhexidine gluconate (MEDLINE KIT) 0.12 % solution Commonly known as: PERIDEX 15 mLs by Mouth Rinse route 2 (two) times daily.   Chlorhexidine Gluconate Cloth 2 % Pads Apply 6 each topically daily.   clonazePAM 1 MG tablet Commonly known as: KLONOPIN Place 1/2 tablet (0.5 mg total) into feeding tube 3 (three) times daily as needed for anxiety.   Ensure Active High Protein Liqd Take 1 Can by mouth in the morning and at bedtime.   feeding supplement (OSMOLITE 1.2 CAL) Liqd Place 1,000 mLs into feeding tube continuous.   ipratropium-albuterol 0.5-2.5 (3) MG/3ML Soln Commonly known as: DUONEB Take 3 mLs by nebulization every 4 (four) hours.   levothyroxine 88 MCG tablet Commonly known as: SYNTHROID Take 1 tablet (88 mcg total) by  mouth daily at 6 (six) AM.   lip balm ointment Apply topically as needed for lip care.   metoCLOPramide 10 MG/10ML Soln Commonly known as: REGLAN PLACE 10 MLS INTO FEEDING TUBE EVERY MORNING AND AT BEDTIME   oxyCODONE-acetaminophen 5-325 MG tablet Commonly known as: PERCOCET/ROXICET PLACE 1 TABLET INTO FEEDING TUBE EVERY EIGHT HOURS AS NEEDED FOR UP TO 3 DAYS FOR SEVERE PAIN. What changed:  how much to take how to take this when to take this reasons to take this   pantoprazole sodium 40 mg/20 mL Pack Commonly known as: PROTONIX Place 20 mLs (40 mg total) into feeding tube at bedtime.   phenol 1.4 % Liqd Commonly known as: CHLORASEPTIC Use as directed 1 spray in the mouth or throat as needed for throat irritation / pain.   polyethylene glycol 17 g packet Commonly known as: MIRALAX / GLYCOLAX Place 17 g into feeding tube daily as needed for mild constipation.   revefenacin 175 MCG/3ML nebulizer solution Commonly known as: YUPELRI Take 3 mLs (175 mcg total) by nebulization daily.   rosuvastatin 40 MG tablet Commonly known as: CRESTOR Place 1 tablet (40 mg total) into feeding tube  daily.   senna-docusate 8.6-50 MG tablet Commonly known as: Senokot-S Place 2 tablets into feeding tube at bedtime as needed for moderate constipation.   simethicone 40 MG/0.6ML drops Commonly known as: MYLICON Place 0.6 mLs (40 mg total) into feeding tube every 6 (six) hours.   sodium chloride 0.65 % Soln nasal spray Commonly known as: OCEAN Place 1 spray into both nostrils as needed for congestion.   sorbitol 70 % Soln Place 30 mLs into feeding tube daily as needed for moderate constipation.   temazepam 15 MG capsule Commonly known as: RESTORIL Place 1 capsule (15 mg total) into feeding tube at bedtime as needed for sleep.   venlafaxine 37.5 MG tablet Commonly known as: EFFEXOR Place 1 tablet (37.5 mg total) into feeding tube 2 (two) times daily with a meal.        Follow-up  Information     Erick Colace, NP Follow up.   Specialties: Nurse Practitioner, Acute Care, Pulmonary Disease Why: We will call you with appt date and time Contact information: [address] [place] [address] [phone]                 Discharged Condition: stable  38 minutes of time have been dedicated to discharge assessment, planning and discharge instructions.   Signed: Erskine Emery MD PCCM  See Amion for personal pager PCCM on call pager [phone] until 7pm. Please call Elink 7p-7a. [phone]  10/02/2020 11:39 AM

## 2020-10-02 NOTE — Telephone Encounter (Signed)
FYI Spoke to patient's spouse, he stated the patient was at the [facility] emergency room yesterday for coughing up plugs of mucus. The patient trachea tubing was changed from size 6 to a 8 because it was deflating by itself causing the problem. She is home feeling much better. Thank you.

## 2020-10-03 ENCOUNTER — Other Ambulatory Visit: Payer: Self-pay | Admitting: *Deleted

## 2020-10-03 LAB — CULTURE, RESPIRATORY W GRAM STAIN

## 2020-10-03 MED ORDER — DOXYCYCLINE MONOHYDRATE 100 MG PO TABS: 100.0000 mg | ORAL_TABLET | Freq: Two times a day (BID) | ORAL | 0 refills | Status: AC

## 2020-10-03 NOTE — Telephone Encounter (Signed)
Spoke to Iva [employer]), spouse as of now is running Osmolite 1.5 cal -1000 supplement. [facility] needs order Osmolite 1.2 cal supplement, 1000 ml and feeding bag supplies. Otherwise, [facility] will not continue sending supplies.

## 2020-10-03 NOTE — Telephone Encounter (Signed)
Growing MRSA in sputum, abundant PMNs Will do 1 week of doxycycline, called husband to let him know  Myrla Halsted MD PCCM

## 2020-10-08 ENCOUNTER — Other Ambulatory Visit: Payer: Self-pay | Admitting: *Deleted

## 2020-10-08 ENCOUNTER — Telehealth: Payer: Self-pay | Admitting: *Deleted

## 2020-10-08 DIAGNOSIS — J9611 Chronic respiratory failure with hypoxia: Secondary | ICD-10-CM

## 2020-10-08 DIAGNOSIS — Z930 Tracheostomy status: Secondary | ICD-10-CM

## 2020-10-08 NOTE — Telephone Encounter (Signed)
   Aurea Graff from [facility] calling to request Osmolite order with flow rate be faxed to [phone]  Also order for [employer]   Phone [phone], option 1

## 2020-10-08 NOTE — Telephone Encounter (Signed)
Faxed DME home order for Osmolite 1.2 calorie supplement, 1000 ml continous and feeding bag supplies. Per Iva at [facility] new  order has to be faxed to continue. Confirmation rec'd OK.

## 2020-10-08 NOTE — Telephone Encounter (Signed)
Left message voice mail for Iva [facility]) to call with fax number. I have made 5 attempts to fax DME order.

## 2020-10-09 NOTE — Telephone Encounter (Signed)
    Kathleen Cameron w/ [facility] is calling again. She is needing a flow rate for the Osmolite and the length of need. She is also requesting OV notes.    Fax: [phone] Phone: [phone], option 1

## 2020-10-10 ENCOUNTER — Telehealth: Payer: Self-pay | Admitting: *Deleted

## 2020-10-10 ENCOUNTER — Other Ambulatory Visit: Payer: Self-pay | Admitting: Emergency Medicine

## 2020-10-10 NOTE — Telephone Encounter (Signed)
Spoke to [place] at [facility], [facility] ([facility]) to check if she received the fax Attn: Si Raider. Per Liborio Nixon it will take about 2 days because it is rec'd by electronic fax. She will make note in patient 's file the order was faxed.

## 2020-10-10 NOTE — Telephone Encounter (Signed)
On 10/08/2020, faxed signed plan of care to [facility]. Faxed rec'd

## 2020-10-11 NOTE — Telephone Encounter (Signed)
DME order faxed to [facility] for Osmolite 1.2 calorie supplement, 1000 ml continuous and feeding bags. Confirmation rec'd.

## 2020-10-12 NOTE — Telephone Encounter (Signed)
Spoke to Bethany at [facility] trying to contact Aurea Graff she left a message to get Osmolite flow rate. I was connected to Elease Hashimoto, I explained to her all orders were started at the hospital when she was admitted. The patient's PCP (Dr Alvy Bimler) did not order Osmolite 1.2 calorie with feeding tube supplies. Per Elease Hashimoto she will let Aurea Graff know to contact the hospital.

## 2020-10-12 NOTE — Telephone Encounter (Signed)
I called [facility] to speak to Aurea Graff about the patient, talked to Kyle for the second time. I was transferred to Nutritional chain, I spoke to [facility] and told her the order for Osmolite was started at the hospital, not PCP. Aurea Graff will have to contact the hospital for flow rate, inpatient notes and duration. Per Elease Hashimoto she let Aurea Graff know.

## 2020-10-14 IMAGING — CR CHEST - 2 VIEW
2 series · 2 of 2 positions shown · non-contrast
Comparison: 11/12/2008.

CLINICAL DATA: Dyspnea on exertion.

EXAM:
CHEST - 2 VIEW

[w chest pa]
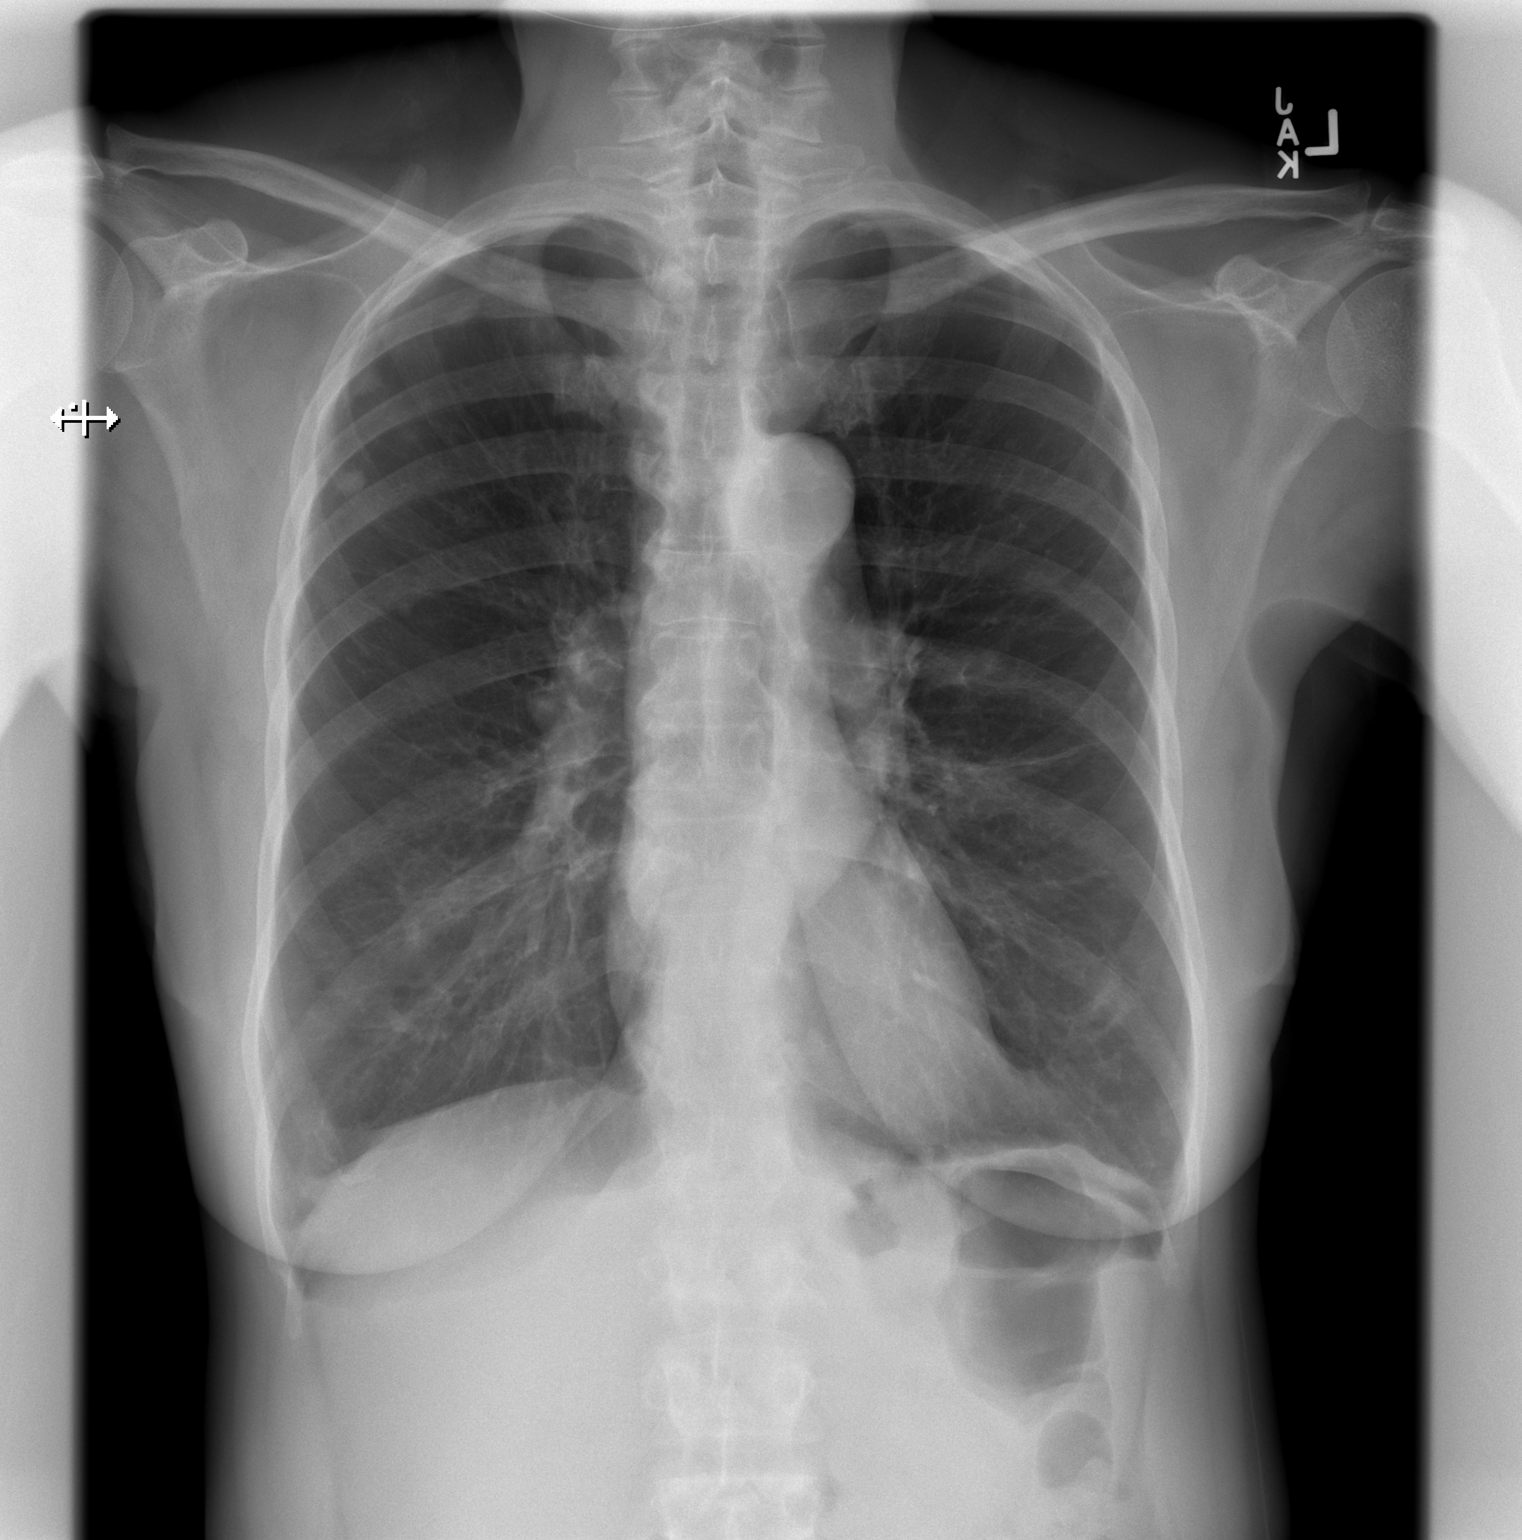

[w chest lat]
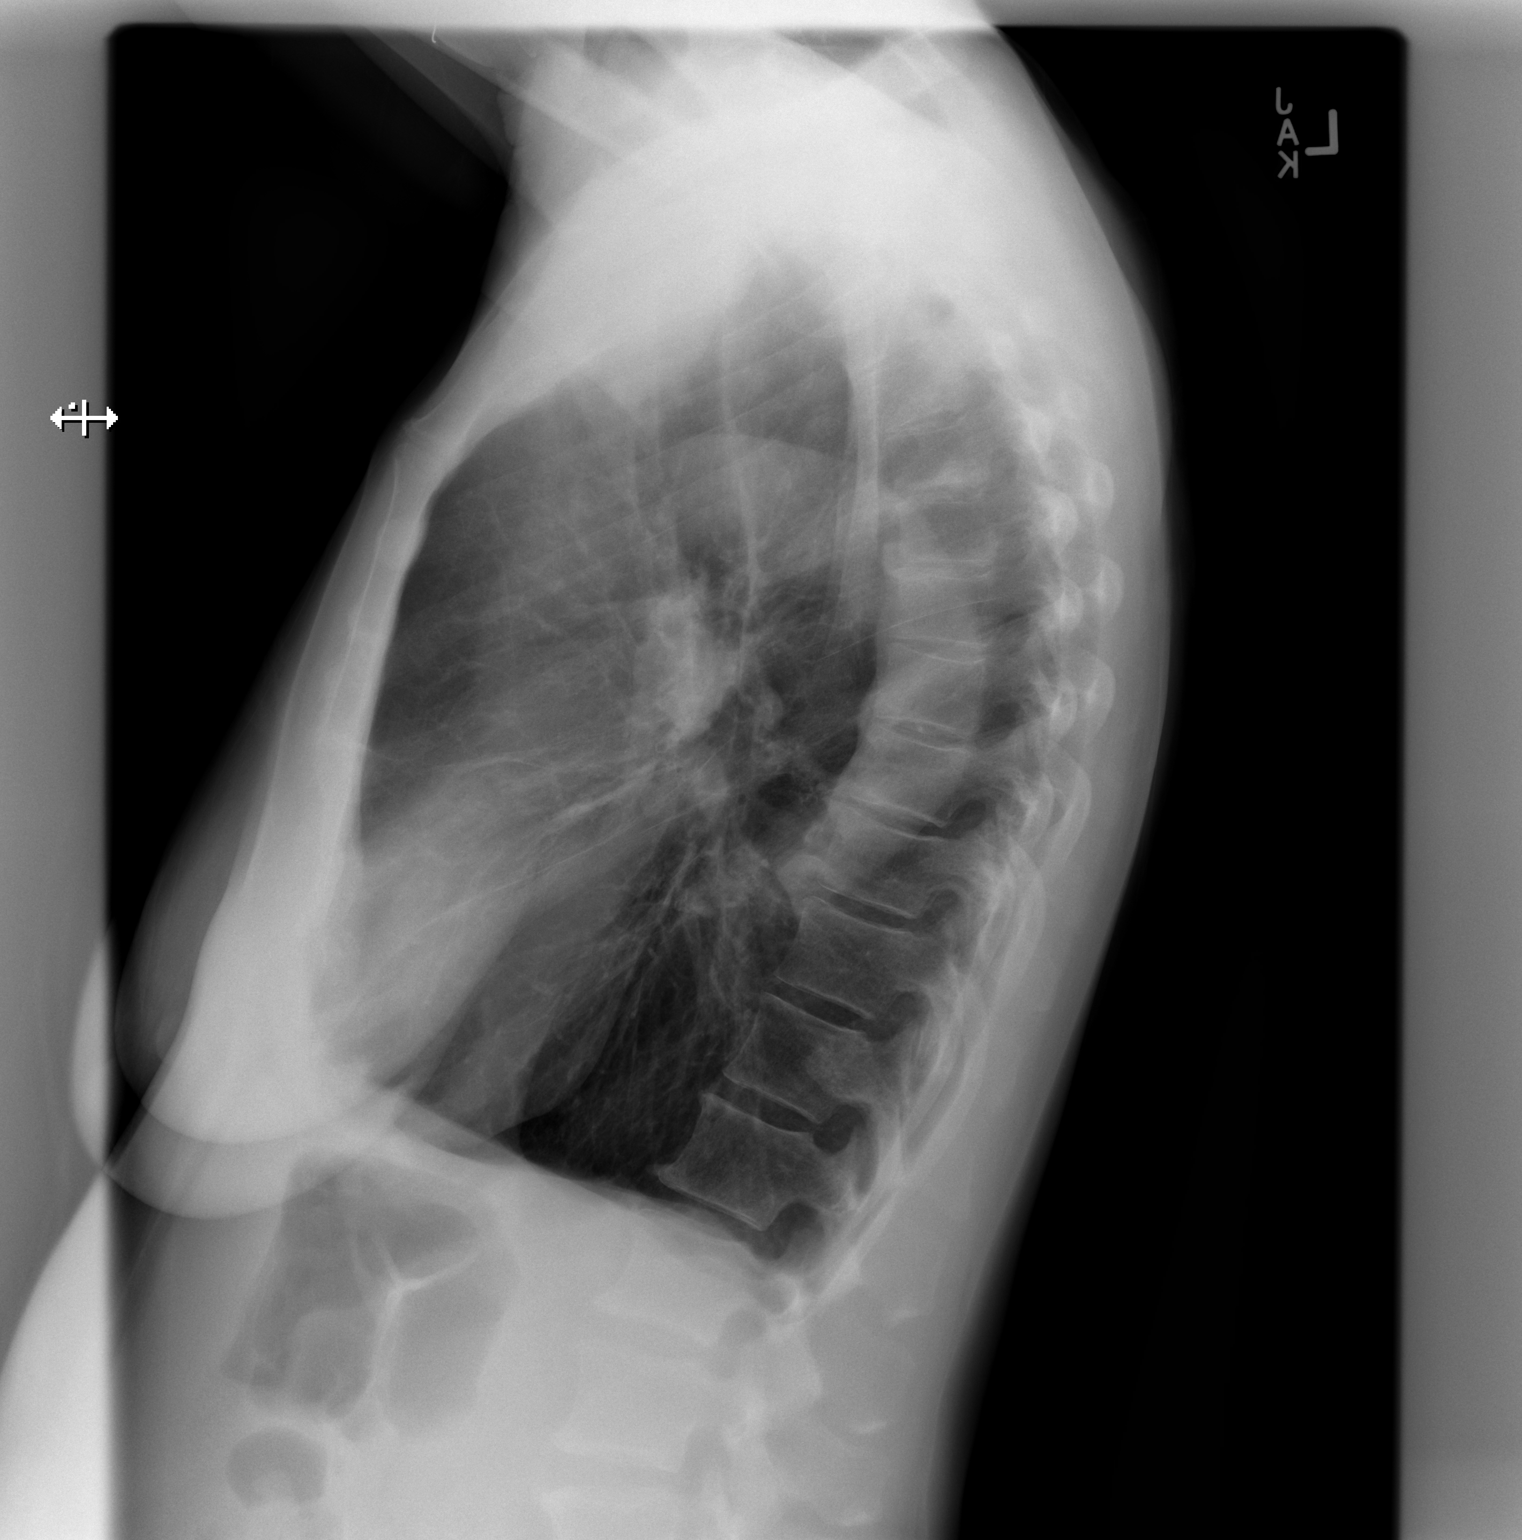

[2 of 2 positions shown; findings below may reference images not displayed]

FINDINGS: Mediastinum and hilar structures normal. Heart size normal. Three
pulmonary nodules are noted projected over the right upper lung.
These appear to be new. Nonenhanced chest CT suggested to further
evaluate. No pleural effusion or pneumothorax. No acute bony
abnormality.
IMPRESSION: Three pulmonary nodules are noted projected over the right upper
lung. These appear to be new. Nonenhanced chest CT is suggested to
further evaluate.

## 2020-10-15 NOTE — Telephone Encounter (Signed)
Melanee Spry from [facility] called back to check on the status of the paperwork. Requesting them to be faxed to [phone] after completion.   Please advise.

## 2020-10-16 ENCOUNTER — Telehealth: Payer: Self-pay | Admitting: Emergency Medicine

## 2020-10-16 DIAGNOSIS — J9611 Chronic respiratory failure with hypoxia: Secondary | ICD-10-CM

## 2020-10-16 DIAGNOSIS — J438 Other emphysema: Secondary | ICD-10-CM

## 2020-10-16 DIAGNOSIS — Z930 Tracheostomy status: Secondary | ICD-10-CM

## 2020-10-16 NOTE — Telephone Encounter (Signed)
Left message for Jeff to call back.

## 2020-10-16 NOTE — Telephone Encounter (Signed)
Called and spoke with Trey Paula. He was calling to see if Dr. [name] would be ok with sending an order for her trach supplies. She was seen in the ED 2 weeks by Dr. Katrinka Blazing and [name] and she is needing an order for supplies.   I advised him that since she has not been in the office since August 2021, I would need to ask before placing the order. He verbalized understanding.   RB, please advise if you are ok with this. Thanks!

## 2020-10-17 ENCOUNTER — Other Ambulatory Visit: Payer: Self-pay | Admitting: Critical Care Medicine

## 2020-10-17 ENCOUNTER — Other Ambulatory Visit: Payer: Self-pay | Admitting: Emergency Medicine

## 2020-10-17 NOTE — Telephone Encounter (Signed)
Order has been placed for pt to receive trach supplies. Called and spoke with Trey Paula letting him know this had been done and he verbalized understanding. Nothing further needed.

## 2020-10-17 NOTE — Telephone Encounter (Signed)
Patient requesting refills on Duoneb and Pulmicort. Both are listed under historical provider. Is it ok to refill?  Beth please advise  Thank you

## 2020-10-17 NOTE — Telephone Encounter (Signed)
I have not seen patient since aug 2021, is she using Breztri? If she is then refill duoneb only.

## 2020-10-17 NOTE — Telephone Encounter (Signed)
Yes this is OK. Please order

## 2020-10-18 ENCOUNTER — Telehealth (INDEPENDENT_AMBULATORY_CARE_PROVIDER_SITE_OTHER): Payer: Medicaid Other | Admitting: Emergency Medicine

## 2020-10-18 ENCOUNTER — Other Ambulatory Visit: Payer: Self-pay | Admitting: Emergency Medicine

## 2020-10-18 DIAGNOSIS — Z930 Tracheostomy status: Secondary | ICD-10-CM | POA: Diagnosis not present

## 2020-10-18 DIAGNOSIS — J449 Chronic obstructive pulmonary disease, unspecified: Secondary | ICD-10-CM

## 2020-10-18 DIAGNOSIS — J9611 Chronic respiratory failure with hypoxia: Secondary | ICD-10-CM

## 2020-10-18 DIAGNOSIS — I10 Essential (primary) hypertension: Secondary | ICD-10-CM

## 2020-10-18 DIAGNOSIS — G8929 Other chronic pain: Secondary | ICD-10-CM | POA: Diagnosis not present

## 2020-10-18 NOTE — Telephone Encounter (Signed)
[facility] pt to verify meds with her. Unable to leave VM. WCB.

## 2020-10-18 NOTE — Progress Notes (Signed)
Telemedicine Encounter- SOAP NOTE Established Patient Patient: Home  Provider: Office   Patient present only MyChart video conference This video telephone encounter was conducted with the patient's (or proxy's) verbal consent via video audio telecommunications: Yes Patient was instructed to have this encounter in a suitably private space; and to only have persons present to whom they give permission to participate. In addition, patient identity was confirmed by use of name plus two identifiers (DOB and address).  I discussed the limitations, risks, security and privacy concerns of performing an evaluation and management service by telephone and the availability of in person appointments. I also discussed with the patient that there may be a patient responsible charge related to this service. The patient expressed understanding and agreed to proceed.  I spent a total of TIME; 0 MIN TO 60 MIN: 20 minutes talking with the patient or their proxy.  No chief complaint on file. Follow-up on chronic medical problems  Subjective   Kathleen Cameron is a 55 y.o. female established patient with multiple chronic medical problems including hypertension and COPD with chronic respiratory failure tracheostomy in place.  Telephone visit today for follow-up.  Husband and visiting nurse helping with history and list of needs.  Requesting pain medication.  Medication list reviewed.  Overall patient doing well in stable condition. All necessary needs addressed during this visit with visiting nurse.  HPI   Patient Active Problem List   Diagnosis Date Noted   Intractable nausea and vomiting    Severe sepsis with septic shock (CODE) (HCC) 08/29/2020   Aspiration pneumonia (HCC)    Goals of care, counseling/discussion    Ileus (HCC)    Tracheostomy status (HCC)    Sepsis (HCC) 06/03/2020   Healthcare-associated pneumonia 06/03/2020   Pressure injury of skin 06/03/2020   Acute respiratory failure (HCC)     Seizure disorder (HCC)    COPD, severe (HCC)    Anxiety disorder    Generalized abdominal pain    Convulsions (HCC)    Malnutrition of moderate degree 02/27/2020   Hypotension    Acute hypercapnic respiratory failure (HCC) 02/25/2020   Right upper quadrant abdominal pain 02/01/2020   Hypothyroidism (acquired) 02/01/2020   Acute metabolic encephalopathy 01/29/2020   Hyponatremia 01/28/2020   Anxiety 12/02/2019   COPD exacerbation (HCC) 11/06/2019   Depression 11/06/2019   Acute on chronic respiratory failure with hypoxia and hypercapnia (HCC) 11/06/2019   Current moderate episode of major depressive disorder without prior episode (HCC) 09/20/2019   Pedal edema 08/04/2019   Hypokalemia 04/13/2019   Chronic respiratory failure with hypoxia (HCC) 03/07/2019   CKD (chronic kidney disease) 01/05/2019   COPD (chronic obstructive pulmonary disease) (HCC) 09/29/2018   Pulmonary nodules 09/27/2018   Dyslipidemia 07/07/2018   Essential hypertension 03/10/2018    Past Medical History:  Diagnosis Date   Acute on chronic respiratory failure with hypoxia (HCC)    Anxiety disorder    Asthma    COPD (chronic obstructive pulmonary disease) (HCC)    COPD, severe (HCC)    Hypertension    Seizure disorder (HCC)     Current Outpatient Medications  Medication Sig Dispense Refill   amLODipine (NORVASC) 10 MG tablet Place 1 tablet (10 mg total) into feeding tube daily. 30 tablet 1   arformoterol (BROVANA) 15 MCG/2ML NEBU Take 2 mLs (15 mcg total) by nebulization 2 (two) times daily. 120 mL 3   bisacodyl (DULCOLAX) 10 MG suppository Place 1 suppository (10 mg total) rectally daily as needed for  severe constipation. 12 suppository 0   budesonide (PULMICORT) 0.25 MG/2ML nebulizer solution Take 2 mLs (0.25 mg total) by nebulization 2 (two) times daily. 60 mL 12   budesonide (PULMICORT) 0.5 MG/2ML nebulizer solution INHALE CONTENTS OF 1 VIAL ER NEBULIZER TWICE DAILY 360 mL 3   Chlorhexidine  Gluconate Cloth 2 % PADS Apply 6 each topically daily. 6 each 5   chlorhexidine gluconate, MEDLINE KIT, (PERIDEX) 0.12 % solution 15 mLs by Mouth Rinse route 2 (two) times daily. 120 mL 2   clonazePAM (KLONOPIN) 1 MG tablet Place 1/2 tablet (0.5 mg total) into feeding tube 3 (three) times daily as needed for anxiety. 12 tablet 0   doxycycline (MONODOX) 100 MG capsule Take 100 mg by mouth 2 (two) times daily.     ipratropium-albuterol (DUONEB) 0.5-2.5 (3) MG/3ML SOLN INAHEL CONTENTS OF 1 VIAL PER NEBULIZER EVER 4 HOURS 540 mL 3   levothyroxine (SYNTHROID) 88 MCG tablet Take 1 tablet (88 mcg total) by mouth daily at 6 (six) AM. 30 tablet 1   lip balm (CARMEX) ointment Apply topically as needed for lip care. 7 g 0   metoCLOPramide (REGLAN) 10 MG/10ML SOLN PLACE 10 MLS INTO FEEDING TUBE EVERY MORNING AND AT BEDTIME 473 mL 1   Nutritional Supplements (ENSURE ACTIVE HIGH PROTEIN) LIQD Take 1 Can by mouth in the morning and at bedtime.     Nutritional Supplements (FEEDING SUPPLEMENT, OSMOLITE 1.2 CAL,) LIQD Place 1,000 mLs into feeding tube continuous.  0   oxyCODONE-acetaminophen (PERCOCET/ROXICET) 5-325 MG tablet PLACE 1 TABLET INTO FEEDING TUBE EVERY EIGHT HOURS AS NEEDED FOR UP TO 3 DAYS FOR SEVERE PAIN. 9 tablet 0   pantoprazole sodium (PROTONIX) 40 mg/20 mL PACK Place 20 mLs (40 mg total) into feeding tube at bedtime. 30 mL    phenol (CHLORASEPTIC) 1.4 % LIQD Use as directed 1 spray in the mouth or throat as needed for throat irritation / pain.  0   polyethylene glycol (MIRALAX / GLYCOLAX) 17 g packet Place 17 g into feeding tube daily as needed for mild constipation. 14 each 0   revefenacin (YUPELRI) 175 MCG/3ML nebulizer solution Take 3 mLs (175 mcg total) by nebulization daily. 90 mL 3   rosuvastatin (CRESTOR) 40 MG tablet Place 1 tablet (40 mg total) into feeding tube daily.     senna-docusate (SENOKOT-S) 8.6-50 MG tablet Place 2 tablets into feeding tube at bedtime as needed for moderate  constipation.     simethicone (MYLICON) 40 HT/3.4KA drops Place 0.6 mLs (40 mg total) into feeding tube every 6 (six) hours. 30 mL 0   sodium chloride (OCEAN) 0.65 % SOLN nasal spray Place 1 spray into both nostrils as needed for congestion.  0   sorbitol 70 % SOLN Place 30 mLs into feeding tube daily as needed for moderate constipation. 473 mL 2   temazepam (RESTORIL) 15 MG capsule Place 1 capsule (15 mg total) into feeding tube at bedtime as needed for sleep. 30 capsule 1   venlafaxine (EFFEXOR) 37.5 MG tablet Place 1 tablet (37.5 mg total) into feeding tube 2 (two) times daily with a meal. 180 tablet 1   No current facility-administered medications for this visit.    Allergies  Allergen Reactions   Stiolto Respimat [Tiotropium Bromide-Olodaterol] Other (See Comments)    Severe headaches and rapid heartrate    Social History   Socioeconomic History   Marital status: Married    Spouse name: Not on file   Number of children: Not on file  Years of education: Not on file   Highest education level: Not on file  Occupational History   Not on file  Tobacco Use   Smoking status: Never   Smokeless tobacco: Never  Vaping Use   Vaping Use: Never used  Substance and Sexual Activity   Alcohol use: Yes    Alcohol/week: 2.0 standard drinks    Types: 2 Cans of beer per week   Drug use: Never   Sexual activity: Not on file  Other Topics Concern   Not on file  Social History Narrative   Not on file   Social Determinants of Health   Financial Resource Strain: Not on file  Food Insecurity: No Food Insecurity   Worried About Running Out of Food in the Last Year: Never true   Ran Out of Food in the Last Year: Never true  Transportation Needs: No Transportation Needs   Lack of Transportation (Medical): No   Lack of Transportation (Non-Medical): No  Physical Activity: Not on file  Stress: Not on file  Social Connections: Not on file  Intimate Partner Violence: Not on file     ROS  Objective  Alert and oriented x3 in no apparent respiratory distress Vitals as reported by the patient: There were no vitals filed for this visit.  There are no diagnoses linked to this encounter. Clinically stable.  Doing well.  Medication list reviewed.  In need of as needed pain medication.  Will prescribe tramadol. No red flag signs or symptoms.  Most recent hospital visit reviewed. Patient feeding well.  No clinical concerns identified during this visit. Diagnoses and all orders for this visit:  Chronic respiratory failure with hypoxia (HCC)  Other chronic pain -     traMADol (ULTRAM) 50 MG tablet; Take 1 tablet (50 mg total) by mouth every 8 (eight) hours as needed for up to 5 days.  Tracheostomy status ([place])  Essential hypertension  COPD, severe ([place])     I discussed the assessment and treatment plan with the patient. The patient was provided an opportunity to ask questions and all were answered. The patient agreed with the plan and demonstrated an understanding of the instructions.   The patient was advised to call back or seek an in-person evaluation if the symptoms worsen or if the condition fails to improve as anticipated.  I provided 20 minutes of non-face-to-face time during this encounter.  Horald Pollen, MD  Primary Care at [facility]

## 2020-10-19 ENCOUNTER — Encounter: Payer: Self-pay | Admitting: Emergency Medicine

## 2020-10-19 ENCOUNTER — Telehealth: Payer: Self-pay | Admitting: Emergency Medicine

## 2020-10-19 MED ORDER — TRAMADOL HCL 50 MG PO TABS: 50.0000 mg | ORAL_TABLET | Freq: Three times a day (TID) | ORAL | 1 refills | Status: AC | PRN

## 2020-10-19 NOTE — Telephone Encounter (Signed)
Prescription for tramadol sent to pharmacy of record.  Thanks.

## 2020-10-19 NOTE — Telephone Encounter (Signed)
   Spouse calling to request pain medication be called for his spouse as discussed during virtual visit on 6/30 with Dr Alvy Bimler   Please advise

## 2020-10-19 NOTE — Telephone Encounter (Signed)
Patient had a virtual visit on 10/18/2020 with Dr Alvy Bimler and the doctor called the patient to complete the visit because the patient was not able to hear. Also, he explained the request for Osmolite was started by the doctor at the hospital during one of the admissions. The order will have to be requested from the doctor.

## 2020-10-23 IMAGING — CT CT ANGIOGRAPHY CHEST
2 of 7 series · 18 of 46 positions shown · IV contrast (APPLIED)
Comparison: Portable chest earlier today.

CLINICAL DATA: 53-year-old female with shortness of breath,
abnormal D-dimer.

EXAM:
CT ANGIOGRAPHY CHEST WITH CONTRAST
TECHNIQUE: Multidetector CT imaging of the chest was performed using the
standard protocol during bolus administration of intravenous
contrast. Multiplanar CT image reconstructions and MIPs were
obtained to evaluate the vascular anatomy.
CONTRAST:  100mL OMNIPAQUE IOHEXOL 350 MG/ML SOLN

[Series 7: thins · axial · 0.59mm/px · z∈[-281,-31]mm · 15 of 404 slices shown]
[im 23/404  lung]
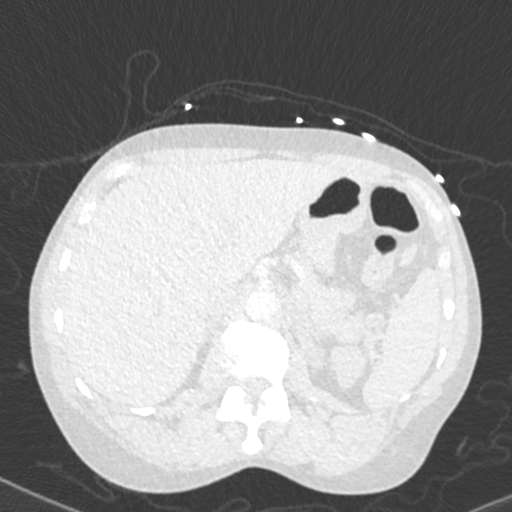
[im 45/404  soft-tissue]
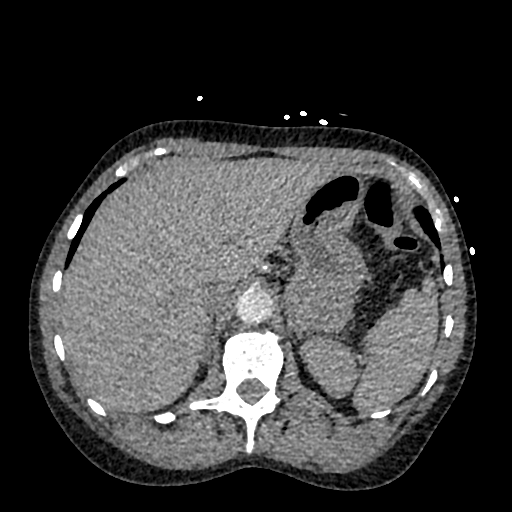
[im 68/404  lung]
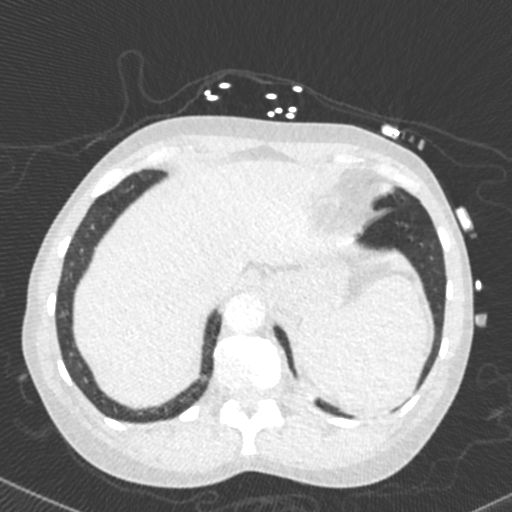
[im 90/404  soft-tissue]
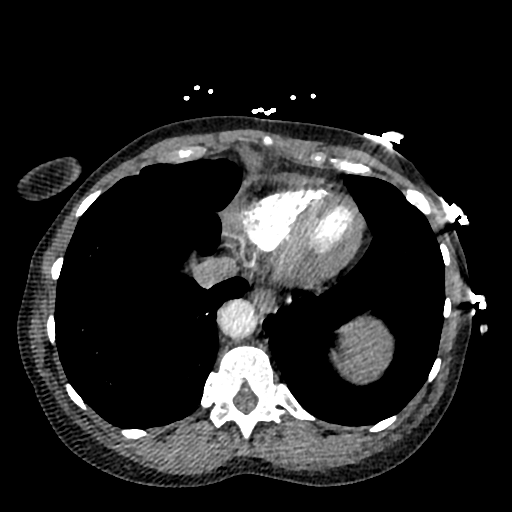
[im 135/404  lung]
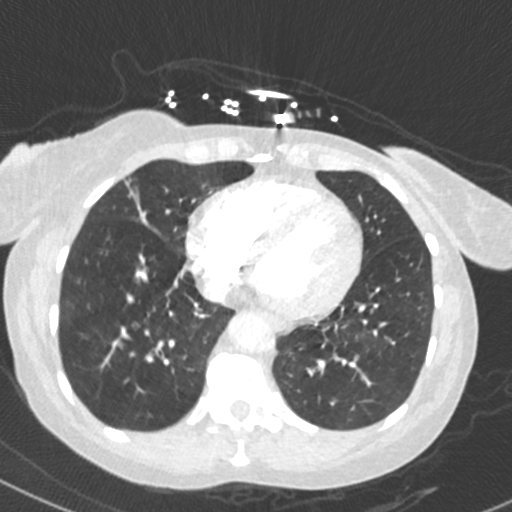
[im 157/404  soft-tissue]
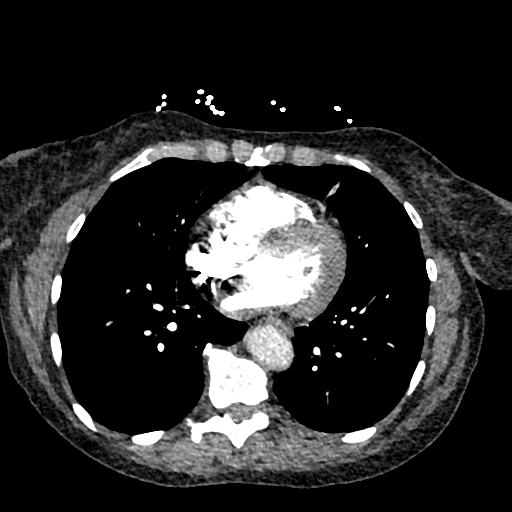
[im 180/404  lung]
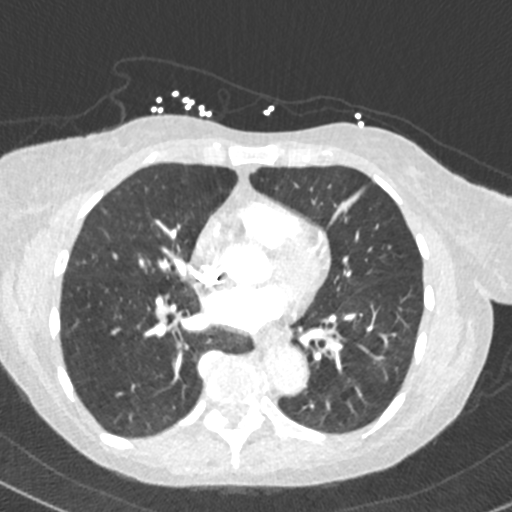
[im 202/404  soft-tissue]
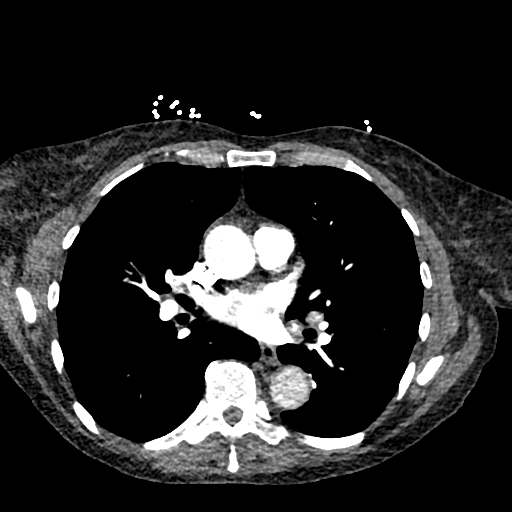
[im 224/404  lung]
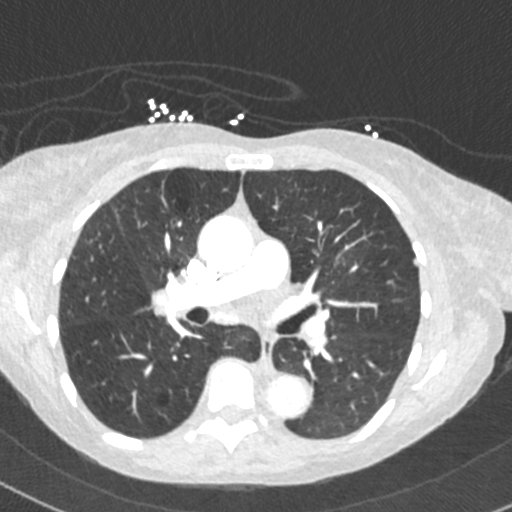
[im 247/404  soft-tissue]
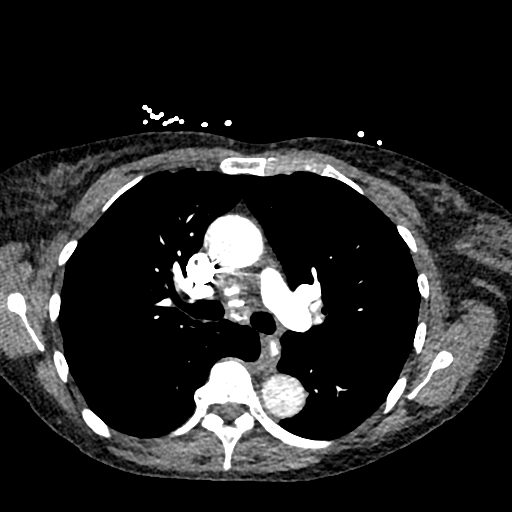
[im 269/404  lung]
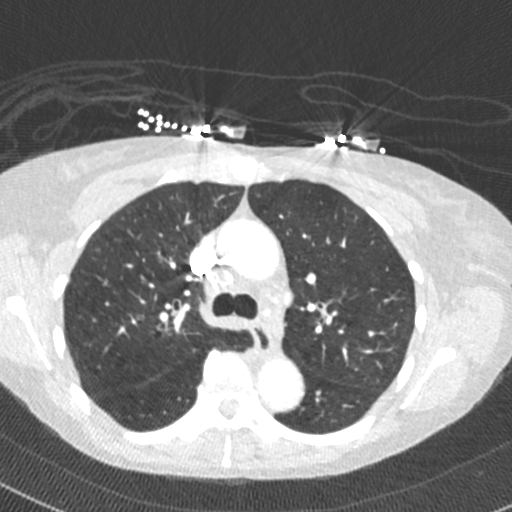
[im 314/404  soft-tissue]
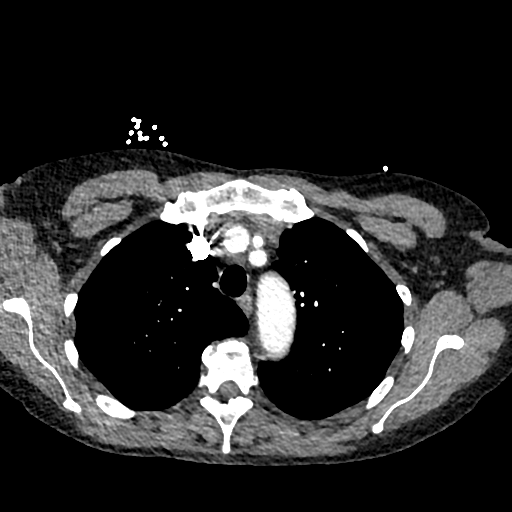
[im 336/404  lung]
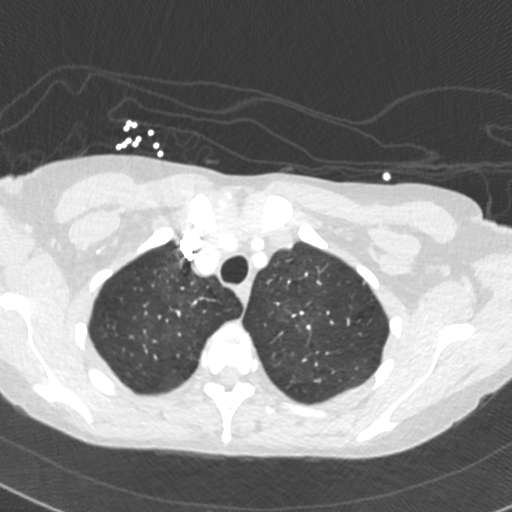
[im 359/404  soft-tissue]
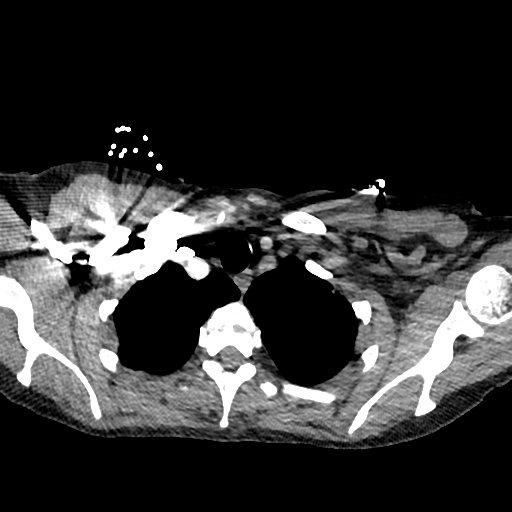
[im 381/404  lung]
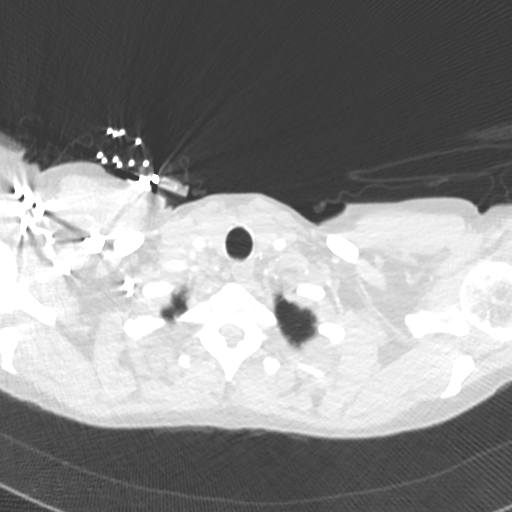

[Series 8: cor · coronal · 0.59mm/px · 3 of 119 slices shown]
[im 30/119  soft-tissue]
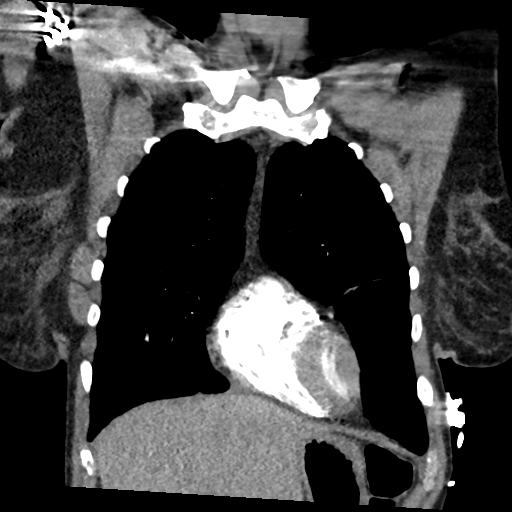
[im 60/119  soft-tissue]
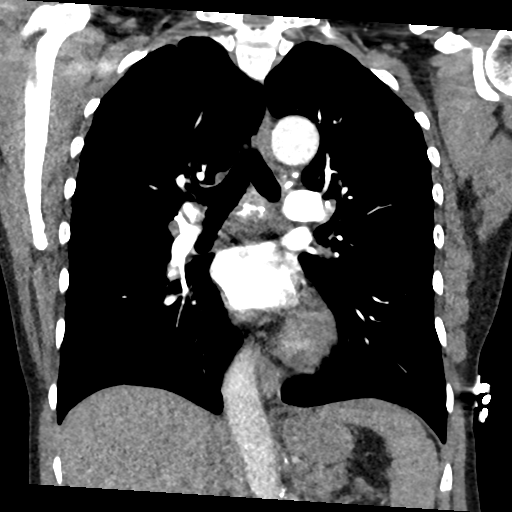
[im 89/119  soft-tissue]
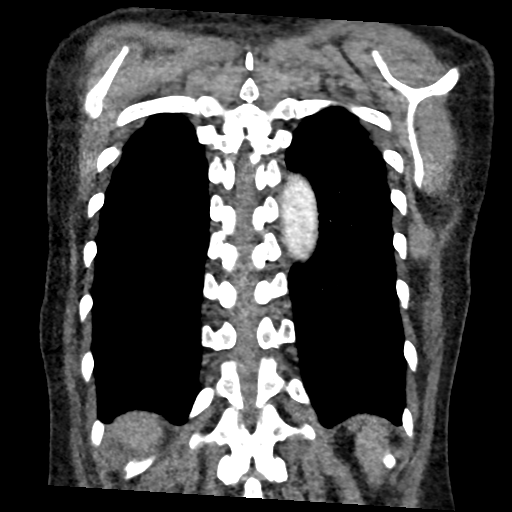

[18 of 46 positions shown; findings below may reference images not displayed]

FINDINGS: Cardiovascular: Excellent contrast bolus timing in the pulmonary
arterial tree.

No focal filling defect identified in the pulmonary arteries to
suggest acute pulmonary embolism.

No cardiomegaly. No pericardial effusion. Tortuous thoracic aorta.
No calcified coronary artery atherosclerosis is evident.

Mediastinum/Nodes: Widespread calcified nonenlarged mediastinal and
hilar lymph nodes.

Questionable thyromegaly at the thoracic inlet.

Lungs/Pleura: Evidence of centrilobular emphysema. Major airways are
patent.

Scattered bilateral mostly subpleural pulmonary nodules, fairly
numerous and some partially calcified (series 6, image 40 12
millimeter nodule). No overt calcified pleural plaques. There are
linear areas of scarring in both the lingula and the right middle
lobe. No pleural effusion. No consolidation.

Upper Abdomen: Negative visible noncontrast liver, spleen, pancreas
and bowel in the upper abdomen.

Musculoskeletal: Negative, no acute osseous abnormality identified.

Review of the MIP images confirms the above findings.
IMPRESSION: 1. Negative for acute pulmonary embolus.
2. Emphysema ([id number]).
3. Calcified nonenlarged mediastinal and hilar lymph nodes with
superimposed upper lobe predominant numerous bilateral mostly
subpleural pulmonary nodules, some partially calcified.
Top differential considerations would include treated lymphoma,
silicosis, other pneumoconiosis, sequelae of prior granulomatous
infection, sarcoidosis, amyloidosis.
Non-contrast chest CT at 3-6 months is recommended.
If the nodules are stable at time of repeat CT, then future CT at
18-24 months (from today's scan) is considered optional for low-risk
patients, but is recommended for high-risk patients.
This recommendation follows the consensus statement: Guidelines for
Management of Incidental Pulmonary Nodules Detected on CT Images:

## 2020-10-23 IMAGING — DX PORTABLE CHEST - 1 VIEW
1 series · 1 of 1 positions shown · non-contrast
Comparison: Radiographs September 14, 2018 and November 12, 2008.

CLINICAL DATA: Shortness of breath.

EXAM:
PORTABLE CHEST 1 VIEW

[chest ap]
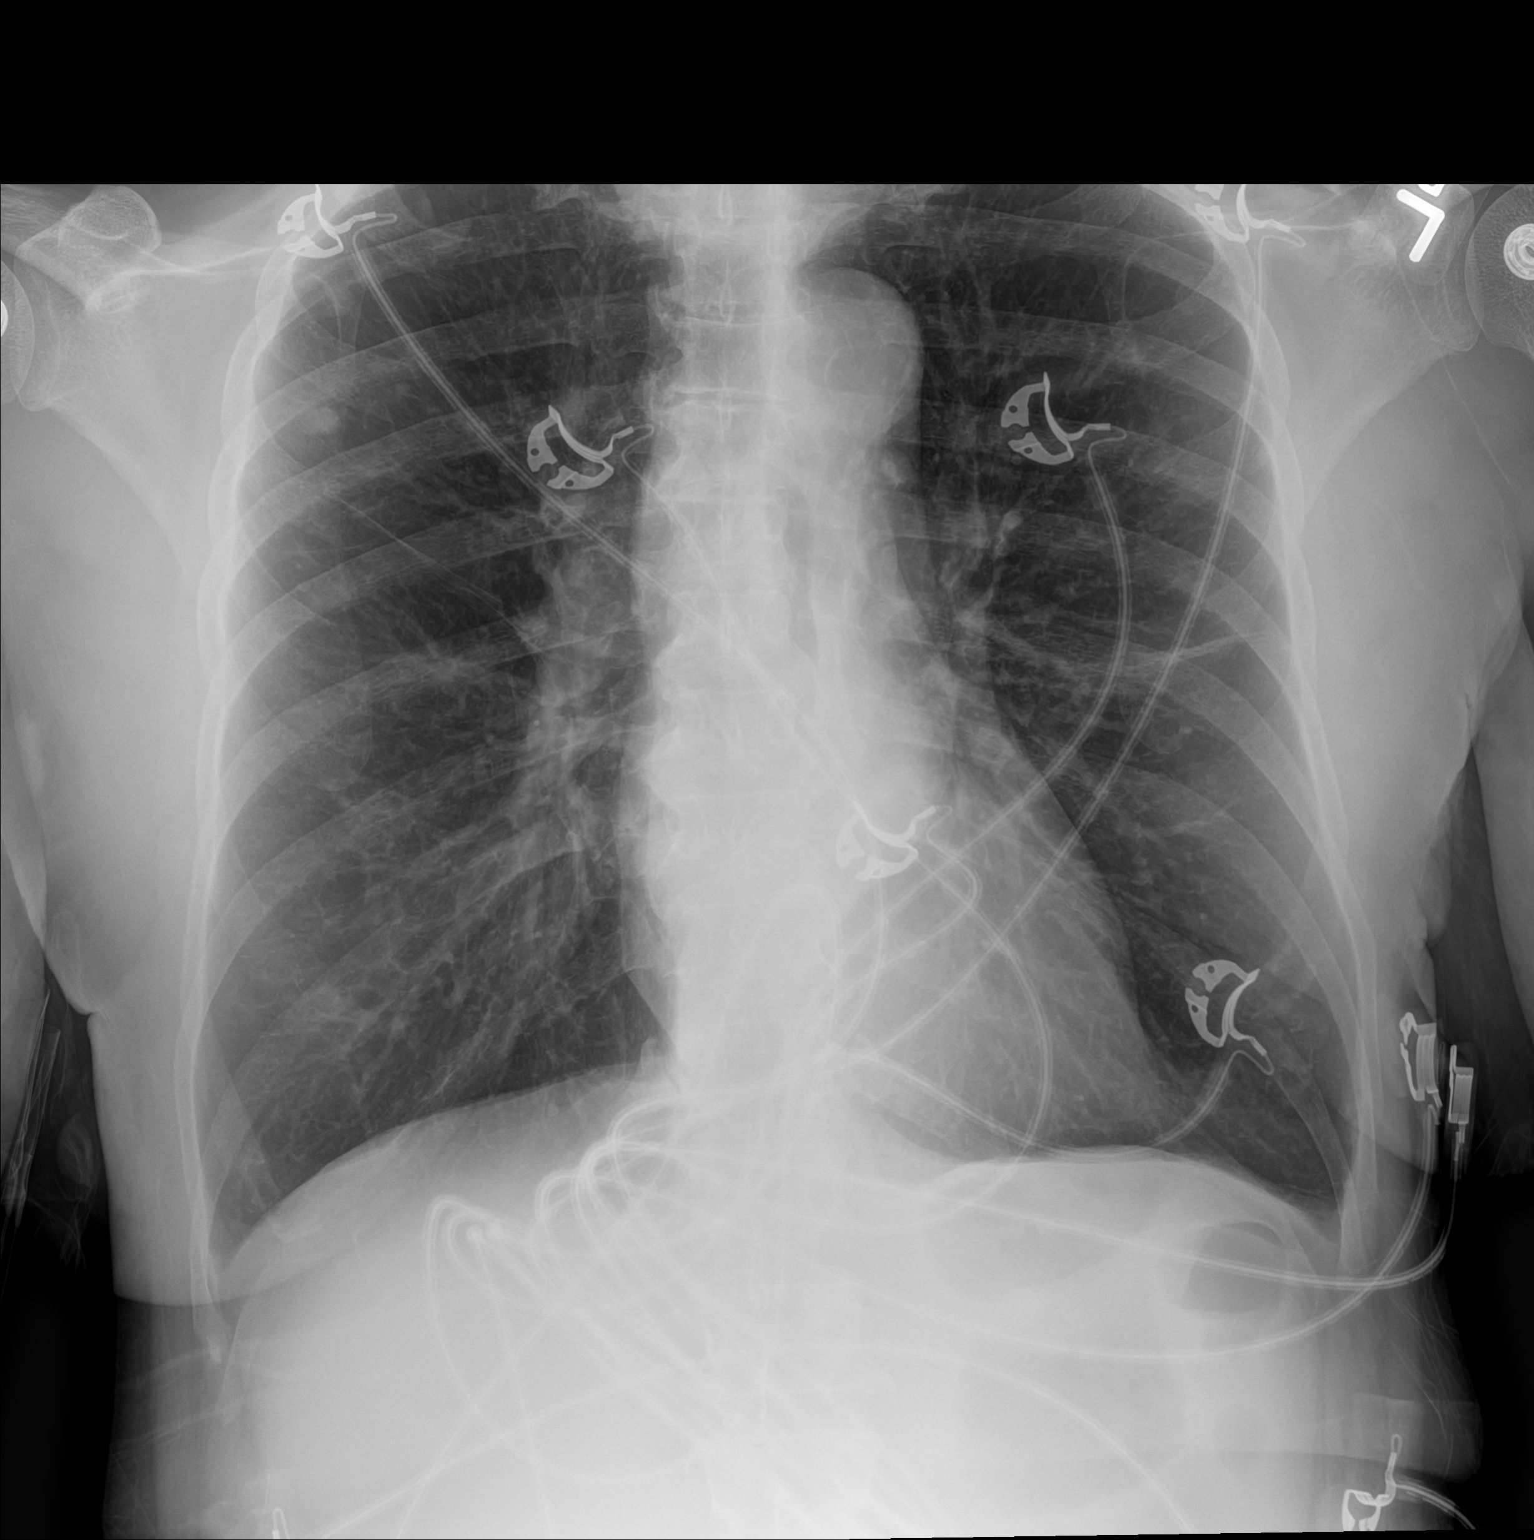

[1 of 1 positions shown; findings below may reference images not displayed]

FINDINGS: The heart size and mediastinal contours are within normal limits. No
pneumothorax or pleural effusion is noted. Stable left midlung
scarring is noted. As noted on prior exam, at least 2 new nodular
densities are noted in the right upper lobe. The visualized skeletal
structures are unremarkable.
IMPRESSION: Several nodular densities are noted in the right upper lobe
concerning for pulmonary nodules and possible neoplasm. CT scan of
the chest is recommended for further evaluation.

## 2020-10-25 ENCOUNTER — Telehealth: Payer: Self-pay | Admitting: Emergency Medicine

## 2020-10-25 NOTE — Telephone Encounter (Signed)
Please provide order for Mepilex as requested.  Thanks.

## 2020-10-25 NOTE — Telephone Encounter (Signed)
   Iva with [name] is requesting an order for Mepilex for the coccyx to prevent breakdown. Please advise  Fax: [phone] Phone: [phone] to LVM

## 2020-10-26 ENCOUNTER — Telehealth: Payer: Self-pay | Admitting: Emergency Medicine

## 2020-10-26 NOTE — Telephone Encounter (Signed)
1.Medication Requested: temazepam (RESTORIL) 15 MG capsule   2. Pharmacy (Name, Street, [place]): [facility] [phone] - [place], [place] - [address]  3. On Med List: yes   4. Last Visit with PCP: 10-18-20  5. Next visit date with PCP: n/a    Agent: Please be advised that RX refills may take up to 3 business days. We ask that you follow-up with your pharmacy.

## 2020-10-29 ENCOUNTER — Telehealth: Payer: Self-pay | Admitting: Emergency Medicine

## 2020-10-29 MED ORDER — DOXYCYCLINE HYCLATE 100 MG PO TABS: 100.0000 mg | ORAL_TABLET | Freq: Two times a day (BID) | ORAL | 0 refills | Status: DC

## 2020-10-29 MED ORDER — TEMAZEPAM 15 MG PO CAPS: 15.0000 mg | ORAL_CAPSULE | Freq: Every evening | ORAL | 1 refills | Status: AC | PRN

## 2020-10-29 NOTE — Telephone Encounter (Signed)
Called and spoke with Hulda Humphrey, RN from [facility] who states that the patient is having some secretions that are very thick and beige in color that is draining through her trach down to the HEPA filter and was just wanting to inform Dr. [name] being the pt is on a trach and vent. States that it started when they changed his trach. Denies any fevers.   Dr. [name] please advise

## 2020-10-29 NOTE — Telephone Encounter (Signed)
Refilled medication

## 2020-10-29 NOTE — Telephone Encounter (Signed)
There is also a phone encounter about the same thing. Please see phone encounter, will close this one.

## 2020-10-29 NOTE — Telephone Encounter (Signed)
I called and spoke with patient regarding TP recs. Patient verbalized understanding and I have sent in doxy to preferred pharmacy. Patient aware to call us if symptoms persist/worsen. Nothing further needed.

## 2020-10-29 NOTE — Telephone Encounter (Signed)
Kathleen Humphrey, RN from [facility] stated that the pt is having some secretions that are very thick and beige in color and was just wanting to inform Dr. [name] being the pt is on a trach and vent.  Pls regard; [phone]

## 2020-10-29 NOTE — Telephone Encounter (Signed)
Can begin Doxcycline 100mg  Twice daily  for 1 week  Continue on maintenance regimen  Last sputum MRSA , tetra sensitive .   If not improving will need ov  Please contact office for sooner follow up if symptoms do not improve or worsen or seek emergency care

## 2020-10-30 NOTE — Telephone Encounter (Signed)
Mepilex is a brand.  Not in epic.  This order was not initiated by me or this office so we need more specific instructions so we can write a prescription and send it to pharmacy.  Thanks.

## 2020-10-30 NOTE — Telephone Encounter (Signed)
Prescription written & faxed to [phone]

## 2020-10-30 NOTE — Telephone Encounter (Signed)
Mepilex 4x4 border foam dressing

## 2020-10-30 NOTE — Telephone Encounter (Signed)
Do we have a specific office prescription paper to print this on?

## 2020-10-31 NOTE — Telephone Encounter (Signed)
Tried calling in to confirm [facility] received faxed POC.  POC noted to be in media with faxed stamp as stated by CMA.

## 2020-11-01 ENCOUNTER — Other Ambulatory Visit: Payer: Self-pay

## 2020-11-01 ENCOUNTER — Telehealth: Payer: Self-pay

## 2020-11-01 ENCOUNTER — Ambulatory Visit (HOSPITAL_COMMUNITY)
Admission: RE | Admit: 2020-11-01 | Discharge: 2020-11-01 | Disposition: A | Discharge status: Home / Self Care | Payer: Medicaid Other | Source: Ambulatory Visit | Attending: Acute Care | Admitting: Acute Care

## 2020-11-01 DIAGNOSIS — Z930 Tracheostomy status: Secondary | ICD-10-CM

## 2020-11-01 DIAGNOSIS — J449 Chronic obstructive pulmonary disease, unspecified: Secondary | ICD-10-CM | POA: Diagnosis not present

## 2020-11-01 DIAGNOSIS — Z4682 Encounter for fitting and adjustment of non-vascular catheter: Secondary | ICD-10-CM | POA: Diagnosis not present

## 2020-11-01 DIAGNOSIS — J9611 Chronic respiratory failure with hypoxia: Secondary | ICD-10-CM | POA: Diagnosis not present

## 2020-11-01 DIAGNOSIS — Z430 Encounter for attention to tracheostomy: Secondary | ICD-10-CM | POA: Diagnosis not present

## 2020-11-01 DIAGNOSIS — Z9911 Dependence on respirator [ventilator] status: Secondary | ICD-10-CM | POA: Diagnosis not present

## 2020-11-01 NOTE — Telephone Encounter (Signed)
Order has been placed for patient's trach supplies.

## 2020-11-01 NOTE — Progress Notes (Addendum)
[facility]   Reason for visit:  Trach change  HPI:  Well known to me. Follow for trach management in setting of chronic respiratory and vent dependence. Presents today for planned trach change. Feeling well. Had started doxy on 7/11 due to sputum change and has noticed decrease in sputum production and feels improved overall.  ROS  History obtained from unobtainable from patient due to pt on vevt General ROS: negative for - chills, fatigue, or fever ENT ROS: negative for - nasal congestion, sinus pain, sneezing, or sore throat Respiratory ROS: negative for - shortness of breath, sputum changes, stridor, or tachypnea Cardiovascular ROS: no chest pain or dyspnea on exertion Gastrointestinal ROS: no abdominal pain, change in bowel habits, or black or bloody stools Musculoskeletal ROS: negative Neurological ROS: no TIA or stroke symptoms Vital signs:  Reviewed: Pulse Ox 90s Exam:  no distress ENT exam normal, no neck nodes or sinus tenderness, throat normal without erythema or exudate, pharynx erythematous without exudate, airway not compromised, and trach site unremarkable  clear to auscultation bilaterally regular rate and rhythm Abdomen soft and nontender without distention, masses , no wound infection noted. extremities normal, atraumatic, no cyanosis or edema Alert and oriented x 3, gait normal., reflexes normal and symmetric, strength and  sensation grossly normal  Trach change/procedure:  trach change shiley size  8 cuffed; 8SCT Placement confirmed w/ ETCO2      Impression/dx  Chronic hypoxic respiratory failure COPD Vent dependence  Trach dependence  Discussion  Falicity is doing well from trach standpoint. Changing to the 8 SCT trach has done well for her. We changed this from 6 flexible shiley which was obstructed by the posterior wall of the trach and we improved by changing her to the 8 SCT. I have learned today that we will likely have to  find an alternative trach for her as they are no longer making this model of trach  Plan  Cont routine trach care Cont vent and copd management per Dr [name]  Will see her in 4 weeks for trach change Will look into seeing if I can find an alternative trach  On our NEXT trach change she will need local lidocaine and ensure optimal positioning as trach change insertion was a little challenging.    Visit time: 32 minutes.   Simonne Martinet ACNP-BC [facility] Pulmonary/Critical Care

## 2020-11-01 NOTE — Progress Notes (Addendum)
Tracheostomy Procedure Note  Kathleen Cameron [id number] Aug 22, 1965  Pre Procedure Tracheostomy Information  Trach Brand: Shiley  8SCT Size:  8.0 Style: Cuffed Secured by: Velcro   Procedure: Trach cleaning and trach change Covid swab done with EMS prior to trach clinic visit  Lot # [id number]  control # [id number]  results negative    Post Procedure Tracheostomy Information  Trach Brand: Shiley   8SCT Size:  8.0 Style: Cuffed Secured by: Velcro   Post Procedure Evaluation:  ETCO2 positive color change from yellow to purple : Yes.   Vital signs:VSS Patients current condition: stable Complications: No apparent complications Trach site exam: clean and dry Wound care done: drain gauze applied Patient did tolerate procedure well.   Education: none  Prescription needs: none    Additional needs: none

## 2020-11-01 NOTE — Telephone Encounter (Signed)
-----   Message from Simonne Martinet, NP sent at 11/01/2020  3:23 PM EDT ----- Regarding: DME Hey Rigoberto Repass  Since you got back to me I figured I send this to you. Yitzel is a [facility] pt but I'm managing her trach.   Need to order the following DME stuff [employer] Valved Tee [employer]  (1 month supply; refill PRN) [employer] Omniflex [employer] (2 month supply; refills PRN)  She uses adapt  The fax # [phone] The nurse said attention: vent/trach    Thanks! Cindee Lame

## 2020-11-05 ENCOUNTER — Encounter: Payer: Self-pay | Admitting: Emergency Medicine

## 2020-11-05 ENCOUNTER — Telehealth: Payer: Self-pay | Admitting: Emergency Medicine

## 2020-11-05 MED ORDER — METOCLOPRAMIDE HCL 10 MG/10ML PO SOLN: ORAL | 1 refills | Status: DC

## 2020-11-05 NOTE — Telephone Encounter (Signed)
Called and spoke with pts spouse and he is aware that the rx for the metoclopiride has been sent to the pharmacy with 1 refill.  She has a pending appt witih RB in August.  Nothing further is needed.

## 2020-11-05 NOTE — Telephone Encounter (Signed)
Pt instructed to call office to set up OV with BW or RB before refill can be filled. Nothing further needed at this time.

## 2020-11-05 NOTE — Telephone Encounter (Signed)
Pt called pulmonologist (see phone encounter from today) & got reglan refilled.

## 2020-11-13 ENCOUNTER — Emergency Department (HOSPITAL_COMMUNITY)
Admission: EM | Admit: 2020-11-13 | Discharge: 2020-11-14 | Disposition: A | Discharge status: Home / Self Care | Payer: Medicaid Other | Attending: Emergency Medicine | Admitting: Emergency Medicine

## 2020-11-13 ENCOUNTER — Other Ambulatory Visit: Payer: Self-pay | Admitting: Emergency Medicine

## 2020-11-13 DIAGNOSIS — Z9911 Dependence on respirator [ventilator] status: Secondary | ICD-10-CM

## 2020-11-13 DIAGNOSIS — Z7951 Long term (current) use of inhaled steroids: Secondary | ICD-10-CM | POA: Diagnosis not present

## 2020-11-13 DIAGNOSIS — J45909 Unspecified asthma, uncomplicated: Secondary | ICD-10-CM | POA: Diagnosis not present

## 2020-11-13 DIAGNOSIS — E039 Hypothyroidism, unspecified: Secondary | ICD-10-CM | POA: Insufficient documentation

## 2020-11-13 DIAGNOSIS — Z79899 Other long term (current) drug therapy: Secondary | ICD-10-CM | POA: Insufficient documentation

## 2020-11-13 DIAGNOSIS — I129 Hypertensive chronic kidney disease with stage 1 through stage 4 chronic kidney disease, or unspecified chronic kidney disease: Secondary | ICD-10-CM | POA: Insufficient documentation

## 2020-11-13 DIAGNOSIS — N189 Chronic kidney disease, unspecified: Secondary | ICD-10-CM | POA: Insufficient documentation

## 2020-11-13 DIAGNOSIS — J449 Chronic obstructive pulmonary disease, unspecified: Secondary | ICD-10-CM | POA: Diagnosis not present

## 2020-11-13 DIAGNOSIS — Z9981 Dependence on supplemental oxygen: Secondary | ICD-10-CM

## 2020-11-13 NOTE — ED Provider Notes (Signed)
[facility] EMERGENCY DEPARTMENT Provider Note   CSN: [id number] Arrival date & time: 11/13/20  1710     History Chief Complaint  Patient presents with   Vent patient    Kathleen Cameron is a 55 y.o. female.  HPI Patient here because she has no electricity at home and is on a chronic ventilator.  She is alert and comfortable.  She expresses only desire for something to drink.  She is able to speak somewhat even though she has a tracheostomy with ventilator attached.  Her husband is with her at the bedside.  He expects that her partner will be back on at 9 PM.  He has communicated with [facility] regarding this.  It is likely that she is on high priority for return of service.    Past Medical History:  Diagnosis Date   Acute on chronic respiratory failure with hypoxia (HCC)    Anxiety disorder    Asthma    COPD (chronic obstructive pulmonary disease) (HCC)    COPD, severe (HCC)    Hypertension    Seizure disorder [facility])     Patient Active Problem List   Diagnosis Date Noted   Intractable nausea and vomiting    Severe sepsis with septic shock (CODE) ([place]) 08/29/2020   Aspiration pneumonia (HCC)    Goals of care, counseling/discussion    Ileus ([place])    Tracheostomy dependence ([place])    Sepsis ([place]) 06/03/2020   Healthcare-associated pneumonia 06/03/2020   Pressure injury of skin 06/03/2020   Acute respiratory failure (HCC)    Seizure disorder (HCC)    COPD, severe (HCC)    Anxiety disorder    Generalized abdominal pain    Convulsions ([place])    Malnutrition of moderate degree 02/27/2020   Hypotension    Acute hypercapnic respiratory failure ([place]) 02/25/2020   Right upper quadrant abdominal pain 02/01/2020   Hypothyroidism (acquired) 77/41/2878   Acute metabolic encephalopathy 67/67/2094   Hyponatremia 01/28/2020   Anxiety 12/02/2019   COPD exacerbation ([place]) 11/06/2019   Depression 11/06/2019   Acute on chronic respiratory failure with hypoxia  and hypercapnia (HCC) 11/06/2019   Current moderate episode of major depressive disorder without prior episode ([place]) 09/20/2019   Pedal edema 08/04/2019   Hypokalemia 04/13/2019   Chronic respiratory failure with hypoxia ([place]) 03/07/2019   CKD (chronic kidney disease) 01/05/2019   COPD (chronic obstructive pulmonary disease) ([place]) 09/29/2018   Pulmonary nodules 09/27/2018   Dyslipidemia 07/07/2018   Essential hypertension 03/10/2018    Past Surgical History:  Procedure Laterality Date   COLONOSCOPY N/A 06/26/2020   Procedure: COLONOSCOPY;  Surgeon: Carol Ada, MD;  Location: [place];  Service: Endoscopy;  Laterality: N/A;   ENTEROSCOPY N/A 06/26/2020   Procedure: ENTEROSCOPY;  Surgeon: Carol Ada, MD;  Location: [place];  Service: Endoscopy;  Laterality: N/A;   HEMOSTASIS CLIP PLACEMENT  06/26/2020   Procedure: HEMOSTASIS CLIP PLACEMENT;  Surgeon: Carol Ada, MD;  Location: [place];  Service: Endoscopy;;   HOT HEMOSTASIS N/A 06/26/2020   Procedure: HOT HEMOSTASIS (ARGON PLASMA COAGULATION/BICAP);  Surgeon: Carol Ada, MD;  Location: [facility];  Service: Endoscopy;  Laterality: N/A;   IR FLUORO RM 30-60 MIN  03/23/2020   IR REPLC GASTRO/COLONIC TUBE PERCUT W/FLUORO  09/12/2020   LAPAROSCOPIC INSERTION GASTROSTOMY TUBE N/A 04/24/2020   Procedure: LAPAROSCOPIC GASTROSTOMY TUBE PLACEMENT;  Surgeon: Mickeal Skinner, MD;  Location: [place];  Service: General;  Laterality: N/A;   POLYPECTOMY  06/26/2020   Procedure: POLYPECTOMY;  Surgeon: Carol Ada, MD;  Location: [facility] ENDOSCOPY;  Service: Endoscopy;;   WISDOM TOOTH EXTRACTION       OB History   No obstetric history on file.     Family History  Family history unknown: Yes    Social History   Tobacco Use   Smoking status: Never   Smokeless tobacco: Never  Vaping Use   Vaping Use: Never used  Substance Use Topics   Alcohol use: Yes    Alcohol/week: 2.0 standard drinks    Types: 2 Cans of beer per week    Drug use: Never    Home Medications Prior to Admission medications   Medication Sig Start Date End Date Taking? Authorizing Provider  amLODipine (NORVASC) 10 MG tablet Place 1 tablet (10 mg total) into feeding tube daily. 09/18/20   Mercy Riding, MD  arformoterol (BROVANA) 15 MCG/2ML NEBU Take 2 mLs (15 mcg total) by nebulization 2 (two) times daily. 07/10/20   Candee Furbish, MD  bisacodyl (DULCOLAX) 10 MG suppository Place 1 suppository (10 mg total) rectally daily as needed for severe constipation. 07/10/20   Horald Pollen, MD  budesonide (PULMICORT) 0.25 MG/2ML nebulizer solution Take 2 mLs (0.25 mg total) by nebulization 2 (two) times daily. 07/10/20   Candee Furbish, MD  budesonide (PULMICORT) 0.5 MG/2ML nebulizer solution INHALE CONTENTS OF 1 VIAL ER NEBULIZER TWICE DAILY 10/18/20   Collene Gobble, MD  Chlorhexidine Gluconate Cloth 2 % PADS Apply 6 each topically daily. 08/21/20   Horald Pollen, MD  chlorhexidine gluconate, MEDLINE KIT, (PERIDEX) 0.12 % solution 15 mLs by Mouth Rinse route 2 (two) times daily. 07/10/20   Candee Furbish, MD  clonazePAM (KLONOPIN) 1 MG tablet Place 1/2 tablet (0.5 mg total) into feeding tube 3 (three) times daily as needed for anxiety. 08/29/20   Horald Pollen, MD  doxycycline (MONODOX) 100 MG capsule Take 100 mg by mouth 2 (two) times daily. 10/03/20   [provider]  doxycycline (VIBRA-TABS) 100 MG tablet Take 1 tablet (100 mg total) by mouth 2 (two) times daily. 10/29/20   Parrett, Fonnie Mu, NP  ipratropium-albuterol (DUONEB) 0.5-2.5 (3) MG/3ML SOLN INAHEL CONTENTS OF 1 VIAL PER NEBULIZER EVER 4 HOURS 10/18/20 01/16/21  Collene Gobble, MD  levothyroxine (SYNTHROID) 88 MCG tablet Take 1 tablet (88 mcg total) by mouth daily at 6 (six) AM. 09/18/20   Mercy Riding, MD  lip balm (CARMEX) ointment Apply topically as needed for lip care. 03/08/20   Candee Furbish, MD  metoCLOPramide (REGLAN) 10 MG/10ML SOLN PLACE 10 MLS INTO FEEDING  TUBE EVERY MORNING AND AT BEDTIME 11/05/20   Collene Gobble, MD  Nutritional Supplements (ENSURE ACTIVE HIGH PROTEIN) LIQD Take 1 Can by mouth in the morning and at bedtime. 07/09/20   Barb Merino, MD  Nutritional Supplements (FEEDING SUPPLEMENT, OSMOLITE 1.2 CAL,) LIQD Place 1,000 mLs into feeding tube continuous. 07/10/20   Horald Pollen, MD  pantoprazole sodium (PROTONIX) 40 mg/20 mL PACK Place 20 mLs (40 mg total) into feeding tube at bedtime. 08/24/20   Horald Pollen, MD  phenol (CHLORASEPTIC) 1.4 % LIQD Use as directed 1 spray in the mouth or throat as needed for throat irritation / pain. 03/08/20   Candee Furbish, MD  polyethylene glycol (MIRALAX / GLYCOLAX) 17 g packet Place 17 g into feeding tube daily as needed for mild constipation. 03/08/20   Candee Furbish, MD  revefenacin Maretta Bees) 175 MCG/3ML nebulizer solution Take  3 mLs (175 mcg total) by nebulization daily. 07/10/20   Candee Furbish, MD  rosuvastatin (CRESTOR) 40 MG tablet Place 1 tablet (40 mg total) into feeding tube daily. 07/10/20   Horald Pollen, MD  senna-docusate (SENOKOT-S) 8.6-50 MG tablet Place 2 tablets into feeding tube at bedtime as needed for moderate constipation. 03/08/20   Candee Furbish, MD  simethicone (MYLICON) 40 QJ/1.9ER drops Place 0.6 mLs (40 mg total) into feeding tube every 6 (six) hours. 09/18/20   Mercy Riding, MD  sodium chloride (OCEAN) 0.65 % SOLN nasal spray Place 1 spray into both nostrils as needed for congestion. 03/08/20   Candee Furbish, MD  sorbitol 70 % SOLN Place 30 mLs into feeding tube daily as needed for moderate constipation. 07/25/20   Candee Furbish, MD  temazepam (RESTORIL) 15 MG capsule Place 1 capsule (15 mg total) into feeding tube at bedtime as needed for sleep. 10/29/20   Horald Pollen, MD  venlafaxine [facility]) 37.5 MG tablet Place 1 tablet (37.5 mg total) into feeding tube 2 (two) times daily with a meal. 07/20/20 10/18/20  Sagardia, Ines Bloomer, MD     Allergies    Stiolto respimat [tiotropium bromide-olodaterol]  Review of Systems   Review of Systems  All other systems reviewed and are negative.  Physical Exam Updated Vital Signs BP 119/64 (BP Location: Right Arm)   Pulse 92   Temp 98 F (36.7 C) (Oral)   Resp 15   Ht $R'5\' 1"'Iq$  (1.549 m)   Wt 52.5 kg   SpO2 100%   BMI 21.87 kg/m   Physical Exam Vitals and nursing note reviewed.  Constitutional:      General: She is not in acute distress.    Appearance: She is well-developed. She is not ill-appearing.  HENT:     Head: Normocephalic and atraumatic.     Right Ear: External ear normal.     Left Ear: External ear normal.  Eyes:     Conjunctiva/sclera: Conjunctivae normal.     Pupils: Pupils are equal, round, and reactive to light.  Neck:     Trachea: Phonation normal.  Cardiovascular:     Rate and Rhythm: Normal rate.  Pulmonary:     Effort: Pulmonary effort is normal.     Comments: Ventilated with breathing tube, attached to ventilator through tracheostomy. Abdominal:     General: There is no distension.  Musculoskeletal:        General: Normal range of motion.     Cervical back: Normal range of motion and neck supple.  Skin:    General: Skin is warm.  Neurological:     Mental Status: She is alert.     Cranial Nerves: No cranial nerve deficit.     Sensory: No sensory deficit.     Motor: No abnormal muscle tone.     Coordination: Coordination normal.  Psychiatric:        Mood and Affect: Mood normal.        Behavior: Behavior normal.        Thought Content: Thought content normal.        Judgment: Judgment normal.    ED Results / Procedures / Treatments   Labs (all labs ordered are listed, but only abnormal results are displayed) Labs Reviewed - No data to display  EKG None  Radiology No results found.  Procedures Procedures   Medications Ordered in ED Medications - No data to display  ED Course  I have  reviewed the triage vital signs and  the nursing notes.  Pertinent labs & imaging results that were available during my care of the patient were reviewed by me and considered in my medical decision making (see chart for details).    MDM Rules/Calculators/A&P                            The patient was admitted to the ED for the sole purpose of powering her ventilator.  She has no acute medical needs.  She will remain here until electricity returns at her home.  Final Clinical Impression(s) / ED Diagnoses Final diagnoses:  Oxygen dependent  Ventilator dependence [facility])    Rx / DC Orders ED Discharge Orders     None        Daleen Bo, MD 11/15/20 1147

## 2020-11-13 NOTE — ED Notes (Addendum)
Pt's vitals up to date. Pt's family member made the comment " why do y'all keep on checking her ? We are only here because our power is out and she's on a life vent." I explained to the pt's family member that it is routine vitals while being the emergency department, it is a safety precaution while the pt is here in our facility. Pt also wanted her purewick checked. Pt's purewick was changed and a new one is in place at this time.

## 2020-11-13 NOTE — ED Triage Notes (Signed)
Pt has no power at home and currently on the ventilator. Power will not be back until 9PM. Pt is alert and oriented x 4. Tolerating current settings.

## 2020-11-13 NOTE — Progress Notes (Signed)
Pt. Is trached and Is here using her home vent due to her home power outage. Pt. Currently resting fine with no issues. Family at bedside.

## 2020-11-14 ENCOUNTER — Other Ambulatory Visit: Payer: Self-pay | Admitting: Internal Medicine

## 2020-11-14 ENCOUNTER — Other Ambulatory Visit: Payer: Self-pay

## 2020-11-14 ENCOUNTER — Telehealth: Payer: Self-pay | Admitting: Emergency Medicine

## 2020-11-14 NOTE — ED Notes (Signed)
Pt and her husband provided with drinks and told to let me know if there is anything else they need

## 2020-11-14 NOTE — Telephone Encounter (Signed)
1.Medication Requested: doxycycline (MONODOX) 100 MG capsule traMADol (ULTRAM) 50 MG tablet  2. Pharmacy (Name, Street, [place]):  3. On Med List: Y   4. Last Visit with PCP: 6.30.22  5. Next visit date with PCP: not scheduled   Agent: Please be advised that RX refills may take up to 3 business days. We ask that you follow-up with your pharmacy.

## 2020-11-14 NOTE — ED Notes (Signed)
PT husband standing at door yelling and cussing at staff about how much longer and where their ride was. Husband upset because another pt was leaving with PTAR and not him and his wife. He was asked to step into the room where there were not other pt. He refused and security was called.

## 2020-11-15 ENCOUNTER — Other Ambulatory Visit: Payer: Self-pay

## 2020-11-15 ENCOUNTER — Other Ambulatory Visit: Payer: Self-pay | Admitting: Emergency Medicine

## 2020-11-15 ENCOUNTER — Telehealth: Payer: Self-pay

## 2020-11-15 MED ORDER — TRAMADOL HCL 50 MG PO TABS: 50.0000 mg | ORAL_TABLET | Freq: Three times a day (TID) | ORAL | 2 refills | Status: DC | PRN

## 2020-11-15 MED ORDER — DOXYCYCLINE MONOHYDRATE 100 MG PO CAPS: 100.0000 mg | ORAL_CAPSULE | Freq: Two times a day (BID) | ORAL | 2 refills | Status: AC

## 2020-11-15 NOTE — Telephone Encounter (Signed)
Pend medications, routed to Dr. Lawerance Bach for approval. Dr. Alvy Bimler is out of the office this week.

## 2020-11-15 NOTE — Telephone Encounter (Signed)
Patient is requesting a refill of the following medications: Requested Prescriptions   Pending Prescriptions Disp Refills   traMADol (ULTRAM) 50 MG tablet 30 tablet 2    Sig: Take 1 tablet (50 mg total) by mouth 3 (three) times daily as needed.   doxycycline (MONODOX) 100 MG capsule 30 capsule 2    Sig: Take 1 capsule (100 mg total) by mouth 2 (two) times daily.   Please Advise, Dr. Alvy Bimler is out office.

## 2020-11-15 NOTE — Telephone Encounter (Signed)
Pended medications, routed to Dr. Lawerance Bach for approval. Dr. Alvy Bimler is out of office.

## 2020-11-15 NOTE — Telephone Encounter (Signed)
pt is in need of a medication refill for traMADol (ULTRAM) 50 MG tablet.

## 2020-11-19 ENCOUNTER — Other Ambulatory Visit: Payer: Self-pay | Admitting: Emergency Medicine

## 2020-11-19 ENCOUNTER — Telehealth: Payer: Self-pay | Admitting: Emergency Medicine

## 2020-11-19 DIAGNOSIS — R1010 Upper abdominal pain, unspecified: Secondary | ICD-10-CM

## 2020-11-19 DIAGNOSIS — E039 Hypothyroidism, unspecified: Secondary | ICD-10-CM

## 2020-11-19 MED ORDER — LEVOTHYROXINE SODIUM 88 MCG PO TABS: 88.0000 ug | ORAL_TABLET | Freq: Every day | ORAL | 1 refills | Status: DC

## 2020-11-19 NOTE — Telephone Encounter (Signed)
Please advise, as chart [place] unclear of the correct dose

## 2020-11-19 NOTE — Telephone Encounter (Signed)
[employer] [place] form; faxed on 11/15/20, Routed to CMA. Can be found in media

## 2020-11-19 NOTE — Telephone Encounter (Signed)
Spouse calling to clarify dosage for Levothyroxine and request refill to [id number] - [place], [place] - [address] AT [facility] [address] [facility]

## 2020-11-19 NOTE — Telephone Encounter (Signed)
Levothyroxine 88 mcg daily.  New prescription sent to pharmacy of record.

## 2020-11-20 NOTE — Telephone Encounter (Signed)
Patient's spouse notified

## 2020-11-20 NOTE — Telephone Encounter (Signed)
Forms have been printed and given to provider for signature

## 2020-11-21 NOTE — Telephone Encounter (Signed)
   Please re-fax order for supplies to [employer] [facility]  vent/trach Fax [phone]

## 2020-11-21 NOTE — Progress Notes (Signed)
Virtual Visit via Video Note  I connected with Kathleen Cameron on 11/21/20 at  9:30 AM EDT by a video enabled telemedicine application and verified that I am speaking with the correct person using two identifiers.  Location: Patient: Home Provider: Office    I discussed the limitations of evaluation and management by telemedicine and the availability of in person appointments. The patient expressed understanding and agreed to proceed.  History of Present Illness: 55 year old female, never smoked. PMH significant for COPD, NSTEMI, HTN, CKD, dyslipidemia, pulmonary nodules. Patient of Dr. [name].   Previous [facility] pulmonary encounters: 4/15/21Clent Ridges, NP Recent hospital admission 4/3-4/4 for COPD exacerbation. Discharged with prednsione taper 4 days. CXR hyperinflated lungs bilaterally. Stable nodular opactities seen right upper lobe. BNP was normal.   Patient reports increased shortness of breath x 1 week. Associated pedal edema. She states that this morning she woke up breathing "real hard". She used her ventolin rescue inhaler and has been fine ever since. She has noticed that her feet have been more swollen recently. She is on 2L oxygen. She is not consistently using Brezteri, she will be getting medication throught drug company with AZD and will be deliveried to her house. Denies cough, nasal congestin, chest tightness or wheezing.  12/02/2019 Patient presents today for 59-month follow-up.  Since last visit she has been seen in the emergency room twice with one hospital admission in July for COPD exacerbation.  She was originally treated with a course of Augmentin.  She completed 5 days of azithromycin and was discharged home on 40 mg prednisone daily for 5 days.  She is to continue home breathing treatments as needed for shortness of breath and wheezing.  She remains on baseline 2 L oxygen.  She is doing well today, no acute symptoms. She remains on Breztri 2 puffs twice daily. Her breathing  has been stable since discharged. She has not needed to use duoneb treatment in the last two weeks. She reports having an asthma like reaction to pulmicort nebulizer while admitted to the hospital. She was given prescription for xanax 0.25mg  as needed for anxiety when she left the hospital. She is requesting a refill.  11/22/2020- interim hx  Patient contacted today for virtual video visit. She was last seen in August 2021 and has multiple hospital admission for respiratory failure, COPD exacerbations and pneumonia. Her most recent hospitalization was in June 2022 for acute respiratory failure. She was treated for COPD exacerbation, in the outpatient setting, in early July with Doxycyline. She was evaluated in ED for oxygen dependence on July 26th. She had no electricity at home and is on a chronic ventilator. She was admitted to ED for sole purpose of powering her ventilator. She had no acute medical needs.  Spoke with patient and her husband today on video visit. She has been doing "a whole lot better" lately, she is using Trilogy at night. She was seen in the trach clinic on July 14th. She is now using shiley size 8 cuffed trach, which has helped decrease amount of secretions she has. We have placed several orders for new trach supplies through her DME company, [employer], but she has yet to received new ones. She needs [employer] and air life t-adapter supplies. She is currently on Borvana +Pulmicort nebulizer twice a day and Yupelri nebulizer once a day for her COPD. She uses albuterol nebulizer 4 times a day. She also uses saline bullets with her tach as needed for congestion once a day. Sputum is  mostly a white-grey color. She has been able to titrate O2 from 6 L down to 4L. She has been able to maintain O2 >95% at rest on 4L. She is WC bound, she is unable to walk by herself and needs help with transfers. She can stand on her own but has poor balance. She has had no falls. Family is looking to get a walker at  home. She would need EMS transport to Denies fever, shortness of breath, chest tightness or wheezing.    Hospital admissions 07/23/19- COPD exacerbation 11/05/19- COPD exacerbation  01/28/20 COPD 02/25/20 Acute respiratory failure/ COPD exacerbation  03/08/20 Respiratory failure  04/10/20- COPD  04/24/20 Laparoscopic gastrostomy tube placement 06/03/20- Acute respiratory failure  07/03/20- Acute respiratory failure, pneumonia  07/21/20- Acute on chronic respiratory failure, HAP  07/30/20- Respiratory distress  08/29/20- Sepsis due to unspecified organism  10/01/20- Acute respiratory failure 11/13/20- Power outage and needed ventilator support, no acute medical needs    Observations/Objective:  - Briefly spoke with patient; no overt shortness of breath, wheezing or cough   - O2 95% on 4L   Assessment and Plan:  Chronic respiratory failure  - S/p tracheostomy; ventilator dependent. She had several hospital admissions between February 2022 -June 2022 for respiratory failure. She reports less mucus plugging since trach size was changed to shiley 8. Decreased oxygen needs, currently using 4L O2 and maintaining >95%. Continue mechanical ventilation with Trilogy. Continue routine tracheostomy care and trach changes q8 weeks. We contacted [employer] today to follow-up on trach/neb supplies.   COPD: - Appears stable; Treated for sputum change on 10/29/20 with course of doxycycline  - Maintained on Yupelri once daily and Brovana + Pulmicort BID  - Use albuterol nebulizer as needed for breakthrough shortness of breath/wheezing 2-4 times a day  - Follow-up in September with Dr. [name], he will need EMS transport if she comes to the office    Follow Up Instructions:  - Call if symptoms worsen or change prior to next visit    I discussed the assessment and treatment plan with the patient. The patient was provided an opportunity to ask questions and all were answered. The patient agreed with the plan and  demonstrated an understanding of the instructions.   The patient was advised to call back or seek an in-person evaluation if the symptoms worsen or if the condition fails to improve as anticipated.  I provided 30 minutes of non-face-to-face time during this encounter.   [name], NP

## 2020-11-22 ENCOUNTER — Telehealth: Payer: Self-pay | Admitting: Primary Care

## 2020-11-22 ENCOUNTER — Telehealth (INDEPENDENT_AMBULATORY_CARE_PROVIDER_SITE_OTHER): Payer: Medicaid Other | Admitting: Primary Care

## 2020-11-22 ENCOUNTER — Encounter: Payer: Self-pay | Admitting: Primary Care

## 2020-11-22 DIAGNOSIS — R2681 Unsteadiness on feet: Secondary | ICD-10-CM | POA: Diagnosis not present

## 2020-11-22 DIAGNOSIS — R5381 Other malaise: Secondary | ICD-10-CM | POA: Diagnosis not present

## 2020-11-22 NOTE — Patient Instructions (Addendum)
COPD: - Continue Yupelri once daily and Brovana + Pulmicort twice a day - Use albuterol nebulizer as needed for breakthrough shortness of breath/wheezing 2-4 times a day   Chronic respiratory failure: - Continue oxygen 4L to maintain O2 >88-90%  - Continue mechanical ventilation with Trilogy - Continue routine tracheostomy care and trach changes q8 weeks - We contacted [employer] today to follow-up on trach/neb supplies  Orders: - Rolling walker  re: balance - PT home eval re: balance - Follow up on trach/neb supplies with [employer]   Follow-up: - September with DR. Byrum

## 2020-11-22 NOTE — Telephone Encounter (Signed)
Patient is WC bound, needs non-emergent EMS transfer to follow-up appointment in September with Dr. [name] if we are able to help coordinate this. If not she will need a video visit  CC: [facility] team

## 2020-11-23 NOTE — Telephone Encounter (Signed)
This is not something we help with. Typically the patient and/or family arranges this.   LMTCB to make family aware.

## 2020-11-23 NOTE — Telephone Encounter (Signed)
Lauren,   I am not sure how those get scheduled, but I would think the patient would need to go through their insurance to get this scheduled.  Or, is this something the nurses arrange?  Or send this to the front to schedule a video visit?

## 2020-11-27 ENCOUNTER — Emergency Department (HOSPITAL_COMMUNITY)
Admission: EM | Admit: 2020-11-27 | Discharge: 2020-11-28 | Disposition: A | Discharge status: Home / Self Care | Payer: Medicaid Other | Attending: Emergency Medicine | Admitting: Emergency Medicine

## 2020-11-27 ENCOUNTER — Telehealth: Payer: Self-pay | Admitting: Emergency Medicine

## 2020-11-27 ENCOUNTER — Encounter (HOSPITAL_COMMUNITY): Payer: Self-pay

## 2020-11-27 ENCOUNTER — Emergency Department (HOSPITAL_COMMUNITY): Payer: Medicaid Other

## 2020-11-27 ENCOUNTER — Telehealth: Payer: Self-pay | Admitting: Primary Care

## 2020-11-27 DIAGNOSIS — R109 Unspecified abdominal pain: Secondary | ICD-10-CM | POA: Diagnosis present

## 2020-11-27 DIAGNOSIS — E039 Hypothyroidism, unspecified: Secondary | ICD-10-CM | POA: Insufficient documentation

## 2020-11-27 DIAGNOSIS — J449 Chronic obstructive pulmonary disease, unspecified: Secondary | ICD-10-CM | POA: Diagnosis not present

## 2020-11-27 DIAGNOSIS — N189 Chronic kidney disease, unspecified: Secondary | ICD-10-CM | POA: Insufficient documentation

## 2020-11-27 DIAGNOSIS — R2681 Unsteadiness on feet: Secondary | ICD-10-CM

## 2020-11-27 DIAGNOSIS — J45909 Unspecified asthma, uncomplicated: Secondary | ICD-10-CM | POA: Insufficient documentation

## 2020-11-27 DIAGNOSIS — I129 Hypertensive chronic kidney disease with stage 1 through stage 4 chronic kidney disease, or unspecified chronic kidney disease: Secondary | ICD-10-CM | POA: Diagnosis not present

## 2020-11-27 DIAGNOSIS — K5900 Constipation, unspecified: Secondary | ICD-10-CM | POA: Diagnosis not present

## 2020-11-27 DIAGNOSIS — Z79899 Other long term (current) drug therapy: Secondary | ICD-10-CM | POA: Insufficient documentation

## 2020-11-27 DIAGNOSIS — K5901 Slow transit constipation: Secondary | ICD-10-CM

## 2020-11-27 DIAGNOSIS — R5381 Other malaise: Secondary | ICD-10-CM

## 2020-11-27 LAB — URINALYSIS, ROUTINE W REFLEX MICROSCOPIC
Bilirubin Urine: NEGATIVE
Glucose, UA: NEGATIVE mg/dL
Hgb urine dipstick: NEGATIVE
Ketones, ur: NEGATIVE mg/dL
Nitrite: NEGATIVE
Protein, ur: NEGATIVE mg/dL
Specific Gravity, Urine: 1.042 — ABNORMAL HIGH (ref 1.005–1.030)
pH: 7 (ref 5.0–8.0)

## 2020-11-27 LAB — CBC WITH DIFFERENTIAL/PLATELET
Abs Immature Granulocytes: 0.03 10*3/uL (ref 0.00–0.07)
Basophils Absolute: 0.1 10*3/uL (ref 0.0–0.1)
Basophils Relative: 1 %
Eosinophils Absolute: 0.4 10*3/uL (ref 0.0–0.5)
Eosinophils Relative: 5 %
HCT: 36.6 % (ref 36.0–46.0)
Hemoglobin: 10.4 g/dL — ABNORMAL LOW (ref 12.0–15.0)
Immature Granulocytes: 0 %
Lymphocytes Relative: 17 %
Lymphs Abs: 1.5 10*3/uL (ref 0.7–4.0)
MCH: 23.6 pg — ABNORMAL LOW (ref 26.0–34.0)
MCHC: 28.4 g/dL — ABNORMAL LOW (ref 30.0–36.0)
MCV: 83.2 fL (ref 80.0–100.0)
Monocytes Absolute: 0.6 10*3/uL (ref 0.1–1.0)
Monocytes Relative: 6 %
Neutro Abs: 6.4 10*3/uL (ref 1.7–7.7)
Neutrophils Relative %: 71 %
Platelets: 331 10*3/uL (ref 150–400)
RBC: 4.4 MIL/uL (ref 3.87–5.11)
RDW: 17 % — ABNORMAL HIGH (ref 11.5–15.5)
WBC: 8.9 10*3/uL (ref 4.0–10.5)
nRBC: 0 % (ref 0.0–0.2)

## 2020-11-27 LAB — COMPREHENSIVE METABOLIC PANEL
ALT: 9 U/L (ref 0–44)
AST: 18 U/L (ref 15–41)
Albumin: 3.2 g/dL — ABNORMAL LOW (ref 3.5–5.0)
Alkaline Phosphatase: 110 U/L (ref 38–126)
Anion gap: 9 (ref 5–15)
BUN: 15 mg/dL (ref 6–20)
CO2: 35 mmol/L — ABNORMAL HIGH (ref 22–32)
Calcium: 10.3 mg/dL (ref 8.9–10.3)
Chloride: 97 mmol/L — ABNORMAL LOW (ref 98–111)
Creatinine, Ser: 0.73 mg/dL (ref 0.44–1.00)
GFR, Estimated: >60 mL/min (ref >60)
Glucose, Bld: 102 mg/dL — ABNORMAL HIGH (ref 70–99)
Potassium: 3.7 mmol/L (ref 3.5–5.1)
Sodium: 141 mmol/L (ref 135–145)
Total Bilirubin: 0.2 mg/dL — ABNORMAL LOW (ref 0.3–1.2)
Total Protein: 8.8 g/dL — ABNORMAL HIGH (ref 6.5–8.1)

## 2020-11-27 LAB — LIPASE, BLOOD: Lipase: 25 U/L (ref 11–51)

## 2020-11-27 MED ORDER — FENTANYL CITRATE (PF) 100 MCG/2ML IJ SOLN
50.0000 ug | Freq: Once | INTRAMUSCULAR | Status: AC
Start: 2020-11-27 — End: 2020-11-27
  Administered 2020-11-27: 50 ug via INTRAVENOUS
  Filled 2020-11-27: qty 2

## 2020-11-27 MED ORDER — SENNOSIDES-DOCUSATE SODIUM 8.6-50 MG PO TABS: 2.0000 | ORAL_TABLET | Freq: Every evening | ORAL | 0 refills | Status: AC | PRN

## 2020-11-27 MED ORDER — SODIUM CHLORIDE 0.9 % IV BOLUS
500.0000 mL | Freq: Once | INTRAVENOUS | Status: AC
  Administered 2020-11-27: 500 mL via INTRAVENOUS

## 2020-11-27 MED ORDER — IOHEXOL 300 MG/ML  SOLN
100.0000 mL | Freq: Once | INTRAMUSCULAR | Status: AC | PRN
  Administered 2020-11-27: 100 mL via INTRAVENOUS

## 2020-11-27 NOTE — Telephone Encounter (Signed)
Susana nurse from [facility] calling back again.. see previous note from earlier today  Patient is still complaining of stomach pain related to not being able to have a BM

## 2020-11-27 NOTE — ED Provider Notes (Signed)
[facility] EMERGENCY DEPARTMENT Provider Note   CSN: [id number] Arrival date & time: 11/27/20  1743     History Chief Complaint  Patient presents with   Abdominal Pain    Kathleen Cameron is a 55 y.o. female presenting for evaluation of abdominal pain and constipation.  Patient states she has had severe abdominal pain, mostly in the lower part of her abdomen.  For the past 5 days, she has been unable to have a bowel movement, this is not her baseline.  She has tried MiraLAX, suppositories, fiber, laxatives without improvement.  History of ileus a few months ago, patient states pain is more severe.  She took Tylenol and tramadol today without significant improvement of symptoms.  She denies fevers, chills, chest pain, shortness of breath, cough, nausea, vomiting.  No change in urination.  Additional history obtained from chart review.  Patient with a history of htn, copd, asthma, sz, anxiety, chronic respiratory failure with trach (vent dependent).      Past Medical History:  Diagnosis Date   Acute on chronic respiratory failure with hypoxia (HCC)    Anxiety disorder    Asthma    COPD (chronic obstructive pulmonary disease) (HCC)    COPD, severe (HCC)    Hypertension    Seizure disorder [facility])     Patient Active Problem List   Diagnosis Date Noted   Intractable nausea and vomiting    Severe sepsis with septic shock (CODE) ([place]) 08/29/2020   Aspiration pneumonia (HCC)    Goals of care, counseling/discussion    Ileus ([place])    Tracheostomy dependence ([place])    Sepsis ([place]) 06/03/2020   Healthcare-associated pneumonia 06/03/2020   Pressure injury of skin 06/03/2020   Acute respiratory failure (HCC)    Seizure disorder (HCC)    COPD, severe (HCC)    Anxiety disorder    Generalized abdominal pain    Convulsions ([place])    Malnutrition of moderate degree 02/27/2020   Hypotension    Acute hypercapnic respiratory failure ([place]) 02/25/2020   Right upper  quadrant abdominal pain 02/01/2020   Hypothyroidism (acquired) 79/89/2119   Acute metabolic encephalopathy 41/74/0814   Hyponatremia 01/28/2020   Anxiety 12/02/2019   COPD exacerbation ([place]) 11/06/2019   Depression 11/06/2019   Acute on chronic respiratory failure with hypoxia and hypercapnia (HCC) 11/06/2019   Current moderate episode of major depressive disorder without prior episode ([place]) 09/20/2019   Pedal edema 08/04/2019   Hypokalemia 04/13/2019   Chronic respiratory failure with hypoxia ([place]) 03/07/2019   CKD (chronic kidney disease) 01/05/2019   COPD (chronic obstructive pulmonary disease) ([place]) 09/29/2018   Pulmonary nodules 09/27/2018   Dyslipidemia 07/07/2018   Essential hypertension 03/10/2018    Past Surgical History:  Procedure Laterality Date   COLONOSCOPY N/A 06/26/2020   Procedure: COLONOSCOPY;  Surgeon: Carol Ada, MD;  Location: [facility];  Service: Endoscopy;  Laterality: N/A;   ENTEROSCOPY N/A 06/26/2020   Procedure: ENTEROSCOPY;  Surgeon: Carol Ada, MD;  Location: [facility];  Service: Endoscopy;  Laterality: N/A;   HEMOSTASIS CLIP PLACEMENT  06/26/2020   Procedure: HEMOSTASIS CLIP PLACEMENT;  Surgeon: Carol Ada, MD;  Location: [facility] [place];  Service: Endoscopy;;   HOT HEMOSTASIS N/A 06/26/2020   Procedure: HOT HEMOSTASIS (ARGON PLASMA COAGULATION/BICAP);  Surgeon: Carol Ada, MD;  Location: [facility];  Service: Endoscopy;  Laterality: N/A;   IR FLUORO RM 30-60 MIN  03/23/2020   IR REPLC GASTRO/COLONIC TUBE PERCUT W/FLUORO  09/12/2020   LAPAROSCOPIC INSERTION GASTROSTOMY TUBE N/A 04/24/2020  Procedure: LAPAROSCOPIC GASTROSTOMY TUBE PLACEMENT;  Surgeon: Kinsinger, Arta Bruce, MD;  Location: [place];  Service: General;  Laterality: N/A;   POLYPECTOMY  06/26/2020   Procedure: POLYPECTOMY;  Surgeon: Carol Ada, MD;  Location: [facility] ENDOSCOPY;  Service: Endoscopy;;   WISDOM TOOTH EXTRACTION       OB History   No obstetric history on file.     Family  History  Family history unknown: Yes    Social History   Tobacco Use   Smoking status: Never   Smokeless tobacco: Never  Vaping Use   Vaping Use: Never used  Substance Use Topics   Alcohol use: Yes    Alcohol/week: 2.0 standard drinks    Types: 2 Cans of beer per week   Drug use: Never    Home Medications Prior to Admission medications   Medication Sig Start Date End Date Taking? Authorizing Provider  amLODipine (NORVASC) 10 MG tablet Place 1 tablet (10 mg total) into feeding tube daily. 09/18/20   Mercy Riding, MD  arformoterol (BROVANA) 15 MCG/2ML NEBU INHALE 2 MLS(1 VIAL) VIA NEBULIZATION TWICE DAILY 11/16/20   Collene Gobble, MD  bisacodyl (DULCOLAX) 10 MG suppository Place 1 suppository (10 mg total) rectally daily as needed for severe constipation. 07/10/20   Horald Pollen, MD  budesonide (PULMICORT) 0.25 MG/2ML nebulizer solution Take 2 mLs (0.25 mg total) by nebulization 2 (two) times daily. 07/10/20   Candee Furbish, MD  Chlorhexidine Gluconate Cloth 2 % PADS Apply 6 each topically daily. 08/21/20   Horald Pollen, MD  chlorhexidine gluconate, MEDLINE KIT, (PERIDEX) 0.12 % solution 15 mLs by Mouth Rinse route 2 (two) times daily. 07/10/20   Candee Furbish, MD  clonazePAM (KLONOPIN) 1 MG tablet Place 1/2 tablet (0.5 mg total) into feeding tube 3 (three) times daily as needed for anxiety. 08/29/20   Horald Pollen, MD  doxycycline (MONODOX) 100 MG capsule Take 1 capsule (100 mg total) by mouth 2 (two) times daily. 11/15/20   Binnie Rail, MD  ipratropium-albuterol (DUONEB) 0.5-2.5 (3) MG/3ML SOLN INAHEL CONTENTS OF 1 VIAL PER NEBULIZER EVER 4 HOURS 10/18/20 01/16/21  Collene Gobble, MD  levothyroxine (SYNTHROID) 88 MCG tablet Take 1 tablet (88 mcg total) by mouth daily at 6 (six) AM. 11/19/20   Sagardia, Ines Bloomer, MD  lip balm (CARMEX) ointment Apply topically as needed for lip care. 03/08/20   Candee Furbish, MD  metoCLOPramide (REGLAN) 10 MG/10ML SOLN  PLACE 10 MLS INTO FEEDING TUBE EVERY MORNING AND AT BEDTIME 11/05/20   Collene Gobble, MD  Nutritional Supplements (ENSURE ACTIVE HIGH PROTEIN) LIQD Take 1 Can by mouth in the morning and at bedtime. 07/09/20   Barb Merino, MD  Nutritional Supplements (FEEDING SUPPLEMENT, OSMOLITE 1.2 CAL,) LIQD Place 1,000 mLs into feeding tube continuous. 07/10/20   Horald Pollen, MD  pantoprazole (PROTONIX) 40 MG tablet TAKE 1 TABLET(40 MG) BY MOUTH DAILY 11/19/20   Horald Pollen, MD  pantoprazole sodium (PROTONIX) 40 mg/20 mL PACK Place 20 mLs (40 mg total) into feeding tube at bedtime. 08/24/20   Horald Pollen, MD  phenol (CHLORASEPTIC) 1.4 % LIQD Use as directed 1 spray in the mouth or throat as needed for throat irritation / pain. 03/08/20   Candee Furbish, MD  polyethylene glycol (MIRALAX / GLYCOLAX) 17 g packet Place 17 g into feeding tube daily as needed for mild constipation. 03/08/20   Candee Furbish, MD  revefenacin [facility]) 175 MCG/3ML  nebulizer solution Take 3 mLs (175 mcg total) by nebulization daily. 07/10/20   Candee Furbish, MD  rosuvastatin (CRESTOR) 20 MG tablet TAKE 1 TABLET(20 MG) BY MOUTH DAILY 11/14/20   Horald Pollen, MD  senna-docusate (SENOKOT-S) 8.6-50 MG tablet Place 2 tablets into feeding tube at bedtime as needed for moderate constipation. 03/08/20   Candee Furbish, MD  sertraline (ZOLOFT) 25 MG tablet Take 75 mg by mouth daily. 10/06/20   [provider]  simethicone (MYLICON) 40 NV/9.57YO drops Place 0.6 mLs (40 mg total) into feeding tube every 6 (six) hours. 09/18/20   Mercy Riding, MD  sodium chloride (OCEAN) 0.65 % SOLN nasal spray Place 1 spray into both nostrils as needed for congestion. 03/08/20   Candee Furbish, MD  sorbitol 70 % SOLN Place 30 mLs into feeding tube daily as needed for moderate constipation. 07/25/20   Candee Furbish, MD  temazepam (RESTORIL) 15 MG capsule Place 1 capsule (15 mg total) into feeding tube at bedtime as needed  for sleep. 10/29/20   Horald Pollen, MD  traMADol (ULTRAM) 50 MG tablet Take 1 tablet (50 mg total) by mouth 3 (three) times daily as needed. 11/15/20   Binnie Rail, MD  venlafaxine (EFFEXOR) 37.5 MG tablet Place 1 tablet (37.5 mg total) into feeding tube 2 (two) times daily with a meal. 07/20/20 11/22/20  Sagardia, Ines Bloomer, MD    Allergies    Stiolto respimat [tiotropium bromide-olodaterol]  Review of Systems   Review of Systems  Physical Exam Updated Vital Signs BP (!) 146/98 (BP Location: Right Arm)   Pulse (!) 103   Temp 98.2 F (36.8 C) (Oral)   Resp (!) 24   Ht _0  (1.549 m)   Wt 52.5 kg   SpO2 100%   BMI 21.87 kg/m   Physical Exam Vitals and nursing note reviewed.  Constitutional:      General: She is not in acute distress.    Appearance: Normal appearance.     Comments: nontoxic  HENT:     Head: Normocephalic and atraumatic.  Eyes:     Conjunctiva/sclera: Conjunctivae normal.     Pupils: Pupils are equal, round, and reactive to light.  Cardiovascular:     Rate and Rhythm: Regular rhythm. Tachycardia present.     Pulses: Normal pulses.     Comments: Mildly tachycardic in the low 100s.  Pulmonary:     Effort: Pulmonary effort is normal. No respiratory distress.     Breath sounds: Normal breath sounds. No wheezing.     Comments: Speaking in full sentences.  Clear lung sounds in all fields. Abdominal:     General: There is distension.     Palpations: Abdomen is soft. There is no mass.     Tenderness: There is abdominal tenderness. There is no rebound.     Comments: Abdomen slightly distended.  Diffuse tenderness palpation, worse on the RUQ, RLQ, and R CVA.  Musculoskeletal:        General: Normal range of motion.     Cervical back: Normal range of motion and neck supple.  Skin:    General: Skin is warm and dry.     Capillary Refill: Capillary refill takes less than 2 seconds.  Neurological:     Mental Status: She is alert and oriented to person,  place, and time.  Psychiatric:        Mood and Affect: Mood and affect normal.  Speech: Speech normal.        Behavior: Behavior normal.    ED Results / Procedures / Treatments   Labs (all labs ordered are listed, but only abnormal results are displayed) Labs Reviewed  CBC WITH DIFFERENTIAL/PLATELET  COMPREHENSIVE METABOLIC PANEL  LIPASE, BLOOD  URINALYSIS, ROUTINE W REFLEX MICROSCOPIC    EKG None  Radiology No results found.  Procedures Procedures   Medications Ordered in ED Medications  fentaNYL (SUBLIMAZE) injection 50 mcg (50 mcg Intravenous Given 11/27/20 1829)    ED Course  I have reviewed the triage vital signs and the nursing notes.  Pertinent labs & imaging results that were available during my care of the patient were reviewed by me and considered in my medical decision making (see chart for details).    MDM Rules/Calculators/A&P                            Patient presenting for evaluation of abdominal pain and constipation.  On exam, patient appears nontoxic.  Her abdomen is slightly distended and diffusely tender.  Consider small bowel obstruction, appendicitis, gallbladder concerns, pyleo, kidney stone, enteritis/colitis.  Will obtain labs, urine, CT abdomen pelvis and treat symptomatically.  Pt signed out to Donavan Foil, PA-C for f/u on labs and CT.  Final Clinical Impression(s) / ED Diagnoses Final diagnoses:  None    Rx / DC Orders ED Discharge Orders     None        Franchot Heidelberg, PA-C 11/27/20 Yevette Edwards    Dorie Rank, MD 11/28/20 2021

## 2020-11-27 NOTE — Telephone Encounter (Signed)
BW placed referral for home health physical therapy on 8/4 and I have been unable to find someone who can take her for services.  I have contacted 9 different home health agencies and either they don't accept her insurance, which is the case with most, or they do not have staffing.  I have documented in the referral everyone I have contacted.  I don't know what Kathleen Cameron would like to do at this point.

## 2020-11-27 NOTE — ED Triage Notes (Signed)
Hx of SBO about 1-2 months ago. Pt is here for right sided abdominal pain. Last regular BM was 8/5. Husband has given pt suppositories, fiber and laxatives with no success. Denies nausea, vomiting.

## 2020-11-27 NOTE — Telephone Encounter (Signed)
Abdominal pain can be a number of things. This needs to be carefully watched to make sure patient is not developing a bowel obstruction. If it is indeed related to lack of bowel movement, recommend trial of MiraLAX and proper hydration.  If she develops severe abdominal pain and or vomiting, she needs to be seen in the emergency department.  Thanks.

## 2020-11-27 NOTE — Telephone Encounter (Signed)
You're correct, ok can you call her and let her know there are not home agencies right now. Offer her outpatient rehab but again she would need transportation which imagine she will not want be able to accommodate right now. If that is the case, I would close referral.

## 2020-11-27 NOTE — Telephone Encounter (Signed)
Placed ambulatory referral to physical therapy. Thanks

## 2020-11-27 NOTE — Telephone Encounter (Signed)
Can we offer her outpatient physical therapy?

## 2020-11-27 NOTE — Discharge Instructions (Addendum)
Thank you for allowing me to care for you today in the Emergency Department.   Your CT scan that showed some constipation today.  No evidence of a bowel blockage.  Please continue your home MiraLAX as prescribed.  You can also place 2 capsules of senna and to your feeding tube at bedtime to help treat constipation.    Please follow-up with your outpatient team if this regimen does not start to produce a soft bowel movement within the next 72 hours.

## 2020-11-27 NOTE — Progress Notes (Signed)
Patient arrived on home vent. Vitals are stable. Suction for trach setup. RT will monitor.

## 2020-11-27 NOTE — Telephone Encounter (Signed)
An order would have to be placed for outpatient physical therapy.  There is another open phone note on patient that they needed EMS transportation to get her to

## 2020-11-27 NOTE — Telephone Encounter (Signed)
I called and spoke to pt's husband and explained issue with home health.  He states is willing to try the outpatient physical therapy.  I gave him the phone # for transportation assistance as well.  Can you put in an order for outpatient rehab and I'll close the home health referral.

## 2020-11-27 NOTE — ED Provider Notes (Signed)
55 year old female   "Kathleen Cameron is a 55 y.o. female presenting for evaluation of abdominal pain and constipation.  Patient states she has had severe abdominal pain, mostly in the lower part of her abdomen.  For the past 5 days, she has been unable to have a bowel movement, this is not her baseline.  She has tried MiraLAX, suppositories, fiber, laxatives without improvement.  History of ileus a few months ago, patient states pain is more severe.  She took Tylenol and tramadol today without significant improvement of symptoms.  She denies fevers, chills, chest pain, shortness of breath, cough, nausea, vomiting.  No change in urination.   Additional history obtained from chart review.  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  Constitutional:      Appearance: She is well-developed.  HENT:     Head: Normocephalic and atraumatic.  Abdominal:     Comments: PEG tube in place.  No surrounding redness or rashes.  No focal tenderness to palpation around PEG tube.  Hypoactive bowel sounds in all 4 quadrants.  Musculoskeletal:        General: Normal range of motion.     Cervical back: Normal range of motion.  Neurological:     Mental Status: She is alert and oriented to person, place, and time.   ED Course/Procedures     Procedures  MDM  54 year old female received in signout from PA [place] pending labs and CT abdomen pelvis.  Please see her note for further work-up and medical decision making.  The patient was discussed with Dr. 56, attending physician.  Labs and imaging of been reviewed and independently interpreted by me.  CBC with no leukocytosis.  However, it does appear hemoconcentrated from her  baseline.  500 cc of IV fluids given.  No significant metabolic derangements.  UA with elevated specific gravity, but appears contaminated due to squamous cells and not concerning for infection.  CT abdomen pelvis with moderate constipation, but no evidence of ileus, stercoral colitis, bowel obstruction.  Discussed these findings with the patient and her family.  Will discharge home with a bowel regimen.  Patient will resume MiraLAX and senna.  Family is agreeable with this plan.  She is hemodynamically stable and in no acute distress.  Safe for discharge home with outpatient follow-up as discussed.       Lynelle Doctor, PA-C 11/28/20 0131    01/28/21, MD 11/28/20 2021

## 2020-11-27 NOTE — ED Notes (Signed)
Pt back from CT

## 2020-11-27 NOTE — Telephone Encounter (Signed)
Susana calling in from [facility]  Patient having abdominal discomfort  No BM since 08.05.22  Gave suppository as well as ducolax to help patient have bm.. had small gold ball size 08.08.22  Active bowel sounds (all 4 sides)  Patient & nurse seeking advice on what to do   Callback [phone]

## 2020-11-28 ENCOUNTER — Ambulatory Visit: Payer: No Typology Code available for payment source | Admitting: Emergency Medicine

## 2020-11-28 NOTE — ED Notes (Signed)
Patient verbalizes understanding of discharge instructions. Opportunity for questioning and answers were provided. Armband removed by staff, pt discharged from ED via [facility] to go home.    

## 2020-11-28 NOTE — ED Notes (Signed)
PT family member standing in doorway of pt room agitated. PT family room repeatedly stating, " pt wants to go home" and that RN, " can do something about it". RN educated pt about [facility] and that Sharin Mons was independent of [place]. PT family member continued to stand in doorway after RN asked family to step inside room to maintain hallway pt privacy. RN made family member aware that pt will need to leave if they do not step back in room. PT family member stated, "Call security, I don't fucking care". Security called. Pt family member now back in room.

## 2020-11-28 NOTE — Telephone Encounter (Signed)
Thanks for the update

## 2020-11-28 NOTE — ED Notes (Addendum)
PT refusing to allow RN to put B/P cuff on.

## 2020-11-28 NOTE — Telephone Encounter (Signed)
Susana called in to notify patient went to ED due to abdominal discomfort  Patient has lactulose at home & wants to know if its okay to take if the MiraLAX does not bring any relief  Callback # [phone]

## 2020-11-28 NOTE — ED Notes (Signed)
[facility] called 6 ahead

## 2020-11-28 NOTE — Telephone Encounter (Signed)
Pt was seen in ED last night (ED notes sent to PCP).  Pt has not had BM yet, however, Susana states pt denies pain at this time & no vomiting noted.  Advised to continue Miralax & adequate hydration; senokot tonight.  Susana sttes she has given her Sorbiol as well.  Advised to keep PCP updated of new or returning symptoms or if no BM over the next 24-48hrs.  Susana also states BP this am 158/102 & endorses pt was in pain at that time.  She has given pt her antihypertive approx 0900 & advised to recheck this afternoon.

## 2020-12-06 ENCOUNTER — Other Ambulatory Visit: Payer: Self-pay | Admitting: Emergency Medicine

## 2020-12-09 ENCOUNTER — Emergency Department (HOSPITAL_COMMUNITY): Payer: Medicaid Other

## 2020-12-09 ENCOUNTER — Encounter (HOSPITAL_COMMUNITY): Payer: Self-pay | Admitting: Emergency Medicine

## 2020-12-09 ENCOUNTER — Other Ambulatory Visit: Payer: Self-pay

## 2020-12-09 ENCOUNTER — Emergency Department (HOSPITAL_COMMUNITY)
Admission: EM | Admit: 2020-12-09 | Discharge: 2020-12-09 | Disposition: A | Discharge status: Home / Self Care | Payer: Medicaid Other | Attending: Emergency Medicine | Admitting: Emergency Medicine

## 2020-12-09 DIAGNOSIS — J441 Chronic obstructive pulmonary disease with (acute) exacerbation: Secondary | ICD-10-CM

## 2020-12-09 DIAGNOSIS — I129 Hypertensive chronic kidney disease with stage 1 through stage 4 chronic kidney disease, or unspecified chronic kidney disease: Secondary | ICD-10-CM | POA: Insufficient documentation

## 2020-12-09 DIAGNOSIS — R0602 Shortness of breath: Secondary | ICD-10-CM | POA: Diagnosis present

## 2020-12-09 DIAGNOSIS — E039 Hypothyroidism, unspecified: Secondary | ICD-10-CM | POA: Insufficient documentation

## 2020-12-09 DIAGNOSIS — Z79899 Other long term (current) drug therapy: Secondary | ICD-10-CM | POA: Diagnosis not present

## 2020-12-09 DIAGNOSIS — Z20822 Contact with and (suspected) exposure to covid-19: Secondary | ICD-10-CM | POA: Insufficient documentation

## 2020-12-09 DIAGNOSIS — N189 Chronic kidney disease, unspecified: Secondary | ICD-10-CM | POA: Diagnosis not present

## 2020-12-09 DIAGNOSIS — J45909 Unspecified asthma, uncomplicated: Secondary | ICD-10-CM | POA: Diagnosis not present

## 2020-12-09 DIAGNOSIS — R69 Illness, unspecified: Secondary | ICD-10-CM

## 2020-12-09 LAB — COMPREHENSIVE METABOLIC PANEL
ALT: 11 U/L (ref 0–44)
AST: 31 U/L (ref 15–41)
Albumin: 3.2 g/dL — ABNORMAL LOW (ref 3.5–5.0)
Alkaline Phosphatase: 111 U/L (ref 38–126)
Anion gap: 12 (ref 5–15)
BUN: 20 mg/dL (ref 6–20)
CO2: 28 mmol/L (ref 22–32)
Calcium: 11.1 mg/dL — ABNORMAL HIGH (ref 8.9–10.3)
Chloride: 98 mmol/L (ref 98–111)
Creatinine, Ser: 1.18 mg/dL — ABNORMAL HIGH (ref 0.44–1.00)
GFR, Estimated: 54 mL/min — ABNORMAL LOW (ref >60)
Glucose, Bld: 109 mg/dL — ABNORMAL HIGH (ref 70–99)
Potassium: 5.4 mmol/L — ABNORMAL HIGH (ref 3.5–5.1)
Sodium: 138 mmol/L (ref 135–145)
Total Bilirubin: 0.7 mg/dL (ref 0.3–1.2)
Total Protein: 8.5 g/dL — ABNORMAL HIGH (ref 6.5–8.1)

## 2020-12-09 LAB — CBC WITH DIFFERENTIAL/PLATELET
Abs Immature Granulocytes: 0.03 10*3/uL (ref 0.00–0.07)
Basophils Absolute: 0.1 10*3/uL (ref 0.0–0.1)
Basophils Relative: 1 %
Eosinophils Absolute: 0.4 10*3/uL (ref 0.0–0.5)
Eosinophils Relative: 5 %
HCT: 38.6 % (ref 36.0–46.0)
Hemoglobin: 11.3 g/dL — ABNORMAL LOW (ref 12.0–15.0)
Immature Granulocytes: 0 %
Lymphocytes Relative: 30 %
Lymphs Abs: 2.5 10*3/uL (ref 0.7–4.0)
MCH: 24.3 pg — ABNORMAL LOW (ref 26.0–34.0)
MCHC: 29.3 g/dL — ABNORMAL LOW (ref 30.0–36.0)
MCV: 83 fL (ref 80.0–100.0)
Monocytes Absolute: 0.8 10*3/uL (ref 0.1–1.0)
Monocytes Relative: 9 %
Neutro Abs: 4.6 10*3/uL (ref 1.7–7.7)
Neutrophils Relative %: 55 %
Platelets: 349 10*3/uL (ref 150–400)
RBC: 4.65 MIL/uL (ref 3.87–5.11)
RDW: 17.6 % — ABNORMAL HIGH (ref 11.5–15.5)
WBC: 8.4 10*3/uL (ref 4.0–10.5)
nRBC: 0 % (ref 0.0–0.2)

## 2020-12-09 LAB — RESP PANEL BY RT-PCR (FLU A&B, COVID) ARPGX2
Influenza A by PCR: NEGATIVE
Influenza B by PCR: NEGATIVE
SARS Coronavirus 2 by RT PCR: NEGATIVE

## 2020-12-09 LAB — LACTIC ACID, PLASMA: Lactic Acid, Venous: 1.6 mmol/L (ref 0.5–1.9)

## 2020-12-09 MED ORDER — IPRATROPIUM-ALBUTEROL 0.5-2.5 (3) MG/3ML IN SOLN
3.0000 mL | Freq: Once | RESPIRATORY_TRACT | Status: AC
  Administered 2020-12-09: 3 mL via RESPIRATORY_TRACT
  Filled 2020-12-09: qty 3

## 2020-12-09 NOTE — Progress Notes (Signed)
I received a phone call from patient's nurse in the ED trauma room C, stating that patient was admitted for shortness of breath, patient did not look like she was in distress, SATS 95% with 3L bleed in thru her home vent, family member at the bedside stated that the been suctioning some thick tan secretions, I suctioned the patient's trach and got some thick tan mucus plugs from her trach, patient stated that she was breathing better when asked by her family member, SATS improved to 100% after suctioning, will continue to monitor patient.

## 2020-12-09 NOTE — ED Notes (Addendum)
Reviewed discharge instructions with patient and spouse. Follow-up care reviewed. Patient and spouse verbalized understanding. Patient A&Ox4, VSS upon discharge. 

## 2020-12-09 NOTE — ED Notes (Signed)
Called [facility] for transport. Per [facility], 2 ahead of pt.

## 2020-12-09 NOTE — Progress Notes (Signed)
Pt transported on home vent from [facility] to CT and back with out any complications, vitals are stable, RN at bedside, RT will continue to monitor.

## 2020-12-09 NOTE — Discharge Instructions (Addendum)
1.  Continue to work with your nurse providers in your primary physician for instructions and care of your tracheostomy and ventilator.  At this time, no immediate problems other than your chronic medical problems with severe lung disease were identified.  Discuss your concerns for nausea and abdominal pain you are experiencing when you get your nebulizer treatments.  Also discussed your concerns with the comfort level of your trach collar. 2.  Return to the emergency department for new concerning or worsening symptoms.

## 2020-12-09 NOTE — ED Provider Notes (Signed)
[facility] EMERGENCY DEPARTMENT Provider Note   CSN: [id number] Arrival date & time: 12/09/20  3419     History Chief Complaint  Patient presents with   Shortness of Breath    Tracheostomy/Ventilator    Kathleen Cameron is a 55 y.o. female.  HPI Patient is chronic respiratory failure is vent dependent.  She is managed at home with daily nursing care and to family members.  Patient reports she has been feeling more short of breath.  Her husband reports that she is distressed by her trach collar.  She often finds it too tight and wants it looser than it is supposed to be.  Ports sometimes this causes the event to alarm.  Patient also is sometimes refusing breathing treatments reporting it makes her abdomen hurt and feel nauseated.  Has not had any fevers at home.  No lower extremity swelling.  Patient was seen about a week ago for constipation.    Past Medical History:  Diagnosis Date   Acute on chronic respiratory failure with hypoxia (HCC)    Anxiety disorder    Asthma    COPD (chronic obstructive pulmonary disease) (HCC)    COPD, severe (HCC)    Hypertension    Seizure disorder [facility])     Patient Active Problem List   Diagnosis Date Noted   Intractable nausea and vomiting    Severe sepsis with septic shock (CODE) (HCC) 08/29/2020   Aspiration pneumonia (HCC)    Goals of care, counseling/discussion    Ileus (HCC)    Tracheostomy dependence (HCC)    Sepsis (HCC) 06/03/2020   Healthcare-associated pneumonia 06/03/2020   Pressure injury of skin 06/03/2020   Acute respiratory failure (HCC)    Seizure disorder (HCC)    COPD, severe (HCC)    Anxiety disorder    Generalized abdominal pain    Convulsions (HCC)    Malnutrition of moderate degree 02/27/2020   Hypotension    Acute hypercapnic respiratory failure (HCC) 02/25/2020   Right upper quadrant abdominal pain 02/01/2020   Hypothyroidism (acquired) 02/01/2020   Acute metabolic encephalopathy  01/29/2020   Hyponatremia 01/28/2020   Anxiety 12/02/2019   COPD exacerbation (HCC) 11/06/2019   Depression 11/06/2019   Acute on chronic respiratory failure with hypoxia and hypercapnia (HCC) 11/06/2019   Current moderate episode of major depressive disorder without prior episode (HCC) 09/20/2019   Pedal edema 08/04/2019   Hypokalemia 04/13/2019   Chronic respiratory failure with hypoxia (HCC) 03/07/2019   CKD (chronic kidney disease) 01/05/2019   COPD (chronic obstructive pulmonary disease) (HCC) 09/29/2018   Pulmonary nodules 09/27/2018   Dyslipidemia 07/07/2018   Essential hypertension 03/10/2018    Past Surgical History:  Procedure Laterality Date   COLONOSCOPY N/A 06/26/2020   Procedure: COLONOSCOPY;  Surgeon: Jeani Hawking, MD;  Location: [facility] ENDOSCOPY;  Service: Endoscopy;  Laterality: N/A;   ENTEROSCOPY N/A 06/26/2020   Procedure: ENTEROSCOPY;  Surgeon: Jeani Hawking, MD;  Location: [facility] ENDOSCOPY;  Service: Endoscopy;  Laterality: N/A;   HEMOSTASIS CLIP PLACEMENT  06/26/2020   Procedure: HEMOSTASIS CLIP PLACEMENT;  Surgeon: Jeani Hawking, MD;  Location: [facility] ENDOSCOPY;  Service: Endoscopy;;   HOT HEMOSTASIS N/A 06/26/2020   Procedure: HOT HEMOSTASIS (ARGON PLASMA COAGULATION/BICAP);  Surgeon: Jeani Hawking, MD;  Location: [facility] ENDOSCOPY;  Service: Endoscopy;  Laterality: N/A;   IR FLUORO RM 30-60 MIN  03/23/2020   IR REPLC GASTRO/COLONIC TUBE PERCUT W/FLUORO  09/12/2020   LAPAROSCOPIC INSERTION GASTROSTOMY TUBE N/A 04/24/2020   Procedure: LAPAROSCOPIC GASTROSTOMY TUBE PLACEMENT;  Surgeon: Kieth Brightly, Arta Bruce, MD;  Location: [place];  Service: General;  Laterality: N/A;   POLYPECTOMY  06/26/2020   Procedure: POLYPECTOMY;  Surgeon: Carol Ada, MD;  Location: [facility] ENDOSCOPY;  Service: Endoscopy;;   WISDOM TOOTH EXTRACTION       OB History   No obstetric history on file.     Family History  Family history unknown: Yes    Social History   Tobacco Use   Smoking status: Never    Smokeless tobacco: Never  Vaping Use   Vaping Use: Never used  Substance Use Topics   Alcohol use: Yes    Alcohol/week: 2.0 standard drinks    Types: 2 Cans of beer per week   Drug use: Never    Home Medications Prior to Admission medications   Medication Sig Start Date End Date Taking? Authorizing Provider  acetaminophen (TYLENOL) 325 MG tablet Place 325 mg into feeding tube every 6 (six) hours as needed for moderate pain or headache.   Yes [provider]  amLODipine (NORVASC) 10 MG tablet Place 1 tablet (10 mg total) into feeding tube daily. 09/18/20  Yes Mercy Riding, MD  arformoterol (BROVANA) 15 MCG/2ML NEBU INHALE 2 MLS(1 VIAL) VIA NEBULIZATION TWICE DAILY Patient taking differently: Take 15 mcg by nebulization in the morning and at bedtime. 11/16/20  Yes Collene Gobble, MD  bisacodyl (DULCOLAX) 10 MG suppository Place 1 suppository (10 mg total) rectally daily as needed for severe constipation. 07/10/20  Yes Sagardia, Ines Bloomer, MD  budesonide (PULMICORT) 0.25 MG/2ML nebulizer solution Take 2 mLs (0.25 mg total) by nebulization 2 (two) times daily. 07/10/20  Yes Candee Furbish, MD  Chlorhexidine Gluconate Cloth 2 % PADS Apply 6 each topically daily. 08/21/20  Yes Sagardia, Ines Bloomer, MD  chlorhexidine gluconate, MEDLINE KIT, (PERIDEX) 0.12 % solution 15 mLs by Mouth Rinse route 2 (two) times daily. 07/10/20  Yes Candee Furbish, MD  clonazePAM (KLONOPIN) 1 MG tablet PLACE 1/2 TABLET INTO FEEDING TUBE THREE TIMES DAILY AS NEEDED FOR ANXIETY Patient taking differently: Place 0.5 mg into feeding tube 3 (three) times daily as needed for anxiety. 12/06/20  Yes Sagardia, Ines Bloomer, MD  doxycycline (MONODOX) 100 MG capsule Take 1 capsule (100 mg total) by mouth 2 (two) times daily. Patient taking differently: Place 100 mg into feeding tube 2 (two) times daily. 11/15/20  Yes Burns, Claudina Lick, MD  ipratropium-albuterol (DUONEB) 0.5-2.5 (3) MG/3ML SOLN INAHEL CONTENTS OF 1 VIAL PER  NEBULIZER EVER 4 HOURS Patient taking differently: Take 3 mLs by nebulization every 4 (four) hours as needed (sob/wheezing). 10/18/20 01/16/21 Yes Collene Gobble, MD  levothyroxine (SYNTHROID) 88 MCG tablet Take 1 tablet (88 mcg total) by mouth daily at 6 (six) AM. Patient taking differently: Place 88 mcg into feeding tube daily at 6 (six) AM. 11/19/20  Yes Sagardia, Ines Bloomer, MD  lip balm (CARMEX) ointment Apply topically as needed for lip care. 03/08/20  Yes Candee Furbish, MD  metoCLOPramide (REGLAN) 10 MG/10ML SOLN PLACE 10 MLS INTO FEEDING TUBE EVERY MORNING AND AT BEDTIME 11/05/20  Yes Byrum, Rose Fillers, MD  Nutritional Supplements (ENSURE ACTIVE HIGH PROTEIN) LIQD Take 1 Can by mouth in the morning and at bedtime. 07/09/20  Yes Barb Merino, MD  Nutritional Supplements (FEEDING SUPPLEMENT, OSMOLITE 1.2 CAL,) LIQD Place 1,000 mLs into feeding tube continuous. 07/10/20  Yes Sagardia, Ines Bloomer, MD  pantoprazole (PROTONIX) 40 MG tablet TAKE 1 TABLET(40 MG) BY MOUTH DAILY Patient taking differently: 40  mg daily. Per tube 11/19/20  Yes Sagardia, Ines Bloomer, MD  phenol (CHLORASEPTIC) 1.4 % LIQD Use as directed 1 spray in the mouth or throat as needed for throat irritation / pain. 03/08/20  Yes Candee Furbish, MD  polyethylene glycol (MIRALAX / GLYCOLAX) 17 g packet Place 17 g into feeding tube daily as needed for mild constipation. 03/08/20  Yes Candee Furbish, MD  revefenacin [facility]) 175 MCG/3ML nebulizer solution Take 3 mLs (175 mcg total) by nebulization daily. Patient taking differently: Take 175 mcg by nebulization every evening. 07/10/20  Yes Candee Furbish, MD  rosuvastatin (CRESTOR) 20 MG tablet TAKE 1 TABLET(20 MG) BY MOUTH DAILY Patient taking differently: Place 20 mg into feeding tube every evening. 11/14/20  Yes Sagardia, Ines Bloomer, MD  senna-docusate (SENOKOT-S) 8.6-50 MG tablet Place 2 tablets into feeding tube at bedtime as needed for moderate constipation. 11/27/20  Yes McDonald,  Mia A, PA-C  Simethicone 125 MG CAPS Give 125 mg by tube daily.   Yes [provider]  sodium chloride (OCEAN) 0.65 % SOLN nasal spray Place 1 spray into both nostrils as needed for congestion. 03/08/20  Yes Candee Furbish, MD  sorbitol 70 % SOLN Place 30 mLs into feeding tube daily as needed for moderate constipation. 07/25/20  Yes Candee Furbish, MD  temazepam (RESTORIL) 15 MG capsule Place 1 capsule (15 mg total) into feeding tube at bedtime as needed for sleep. 10/29/20  Yes Sagardia, Ines Bloomer, MD  traMADol (ULTRAM) 50 MG tablet Take 1 tablet (50 mg total) by mouth 3 (three) times daily as needed. Patient taking differently: Place 50 mg into feeding tube 3 (three) times daily as needed for moderate pain or severe pain. 11/15/20  Yes Burns, Claudina Lick, MD  venlafaxine (EFFEXOR) 37.5 MG tablet Place 1 tablet (37.5 mg total) into feeding tube 2 (two) times daily with a meal. 07/20/20 12/09/20 Yes Sagardia, Ines Bloomer, MD  simethicone (MYLICON) 40 NW/2.9FA drops Place 0.6 mLs (40 mg total) into feeding tube every 6 (six) hours. Patient not taking: Reported on 12/09/2020 09/18/20   Mercy Riding, MD    Allergies    Stiolto respimat [tiotropium bromide-olodaterol]  Review of Systems   Review of Systems 10 systems reviewed and negative except per HPI Physical Exam Updated Vital Signs BP 114/88   Pulse (!) 105   Temp 98 F (36.7 C) (Temporal)   Resp 16   Ht $R'5\' 5"'Mi$  (1.651 m)   Wt 58 kg   SpO2 100%   BMI 21.28 kg/m   Physical Exam Constitutional:      Comments: Alert.  Patient appears mildly uncomfortable.  She has ventilator tracheostomy in place.  Cardiovascular:     Comments: Tachycardia.  Do not appreciate rub murmur gallop.  some obscuring respiratory noise. Pulmonary:     Comments: Trach in place.  Ostomy clean.  Some scattered rhonchi. Abdominal:     Comments: Abdomen mildly distended and soft.  No guarding.  Musculoskeletal:     Comments: No peripheral edema.  Skin:     General: Skin is warm and dry.  Neurological:     Comments: Patient is alert.  He does not appear confused.  She is answering questions or situationally appropriate.    ED Results / Procedures / Treatments   Labs (all labs ordered are listed, but only abnormal results are displayed) Labs Reviewed  COMPREHENSIVE METABOLIC PANEL - Abnormal; Notable for the following components:      Result Value  Potassium 5.4 (*)    Glucose, Bld 109 (*)    Creatinine, Ser 1.18 (*)    Calcium 11.1 (*)    Total Protein 8.5 (*)    Albumin 3.2 (*)    GFR, Estimated 54 (*)    All other components within normal limits  CBC WITH DIFFERENTIAL/PLATELET - Abnormal; Notable for the following components:   Hemoglobin 11.3 (*)    MCH 24.3 (*)    MCHC 29.3 (*)    RDW 17.6 (*)    All other components within normal limits  RESP PANEL BY RT-PCR (FLU A&B, COVID) [id number]  LACTIC ACID, PLASMA    EKG EKG Interpretation  Date/Time:  Sunday December 09 2020 06:57:16 EDT Ventricular Rate:  108 PR Interval:  181 QRS Duration: 75 QT Interval:  326 QTC Calculation: 437 R Axis:   -89 Text Interpretation: Sinus tachycardia Biatrial enlargement Inferior infarct, old Anterior infarct, old Confirmed by Randal Buba, April ([id number]) on 12/09/2020 7:01:18 AM  Radiology CT Chest Wo Contrast  Result Date: 12/09/2020 CLINICAL DATA:  Increased secretions from tracheostomy. EXAM: CT CHEST WITHOUT CONTRAST TECHNIQUE: Multidetector CT imaging of the chest was performed following the standard protocol without IV contrast. COMPARISON:  Plain film of earlier in the day. Most recent chest CT of 02/28/2020. FINDINGS: Cardiovascular: Aortic atherosclerosis. Normal heart size, without pericardial effusion. Lad coronary artery calcification. Mediastinum/Nodes: Calcified mediastinal and bilateral hilar nodes are likely related to old granulomatous disease. Lungs/Pleura: No pleural fluid. Advanced bullous emphysema. Tracheostomy appropriately  positioned. Dependent presumed secretions within the mainstem bronchi. Multiple bilateral calcified nodules which are likely related to old granulomatous disease. Bibasilar cylindrical bronchiectasis is progressive and likely post infectious/inflammatory. Progressive bibasilar scarring. Minimal motion degradation. Upper Abdomen: Normal imaged portions of the liver, spleen, kidneys. Gastrostomy tube. Musculoskeletal: No acute osseous abnormality. IMPRESSION: 1.  No acute process in the chest. 2. Advanced bullous emphysema with progressive bibasilar scarring and post infectious/inflammatory bronchiectasis. 3. Age advanced coronary artery atherosclerosis. Recommend assessment of coronary risk factors and consideration of medical therapy. 4.  Aortic Atherosclerosis (ICD10-I70.0). Electronically Signed   By: Abigail Miyamoto M.D.   On: 12/09/2020 10:09   DG Chest Port 1 View  Result Date: 12/09/2020 CLINICAL DATA:  Short of breath for 1 day.  Asthma. EXAM: PORTABLE CHEST 1 VIEW COMPARISON:  10/01/2020 FINDINGS: Appropriate position of tracheostomy. Normal heart size. Atherosclerosis in the transverse aorta. Costophrenic angle blunting is similar and likely due to pleural thickening or trace fluid. dvanced bullous emphysema again identified. Bibasilar scarring. No superimposed lobar consolidation. lucencies in the lung bases (progressive on the right and new on the left) are likely related to bullous disease. IMPRESSION: Advanced bullous emphysema. Bibasilar lucencies are favored to represent progressive right-sided and new left-sided bullous disease. Small volume pneumothoraces felt less likely. Electronically Signed   By: Abigail Miyamoto M.D.   On: 12/09/2020 09:01    Procedures Procedures   Medications Ordered in ED Medications  ipratropium-albuterol (DUONEB) 0.5-2.5 (3) MG/3ML nebulizer solution 3 mL (3 mLs Nebulization Given 12/09/20 8786)    ED Course  I have reviewed the triage vital signs and the nursing  notes.  Pertinent labs & imaging results that were available during my care of the patient were reviewed by me and considered in my medical decision making (see chart for details).    MDM Rules/Calculators/A&P                          At this  time, there do not appear to be acute changes with the patient's respiratory status.  She is having discomfort with her trach collar.  The appearance looks good.  There is no erythema or drainage.  This was suctioned with good result.  CT of the chest obtained to further evaluate degree of emphysema or identify any acute changes.  Patient does have severe chronic underlying pulmonary disease but CT does not identify any new or looking structural changes that would require change in management.  Patient does not have elevated lactic acid, fever or immediate signs of bacterial pneumonia.  At this time, I do suggest continued close management with her outpatient providers.  Patient has daily in-home nurse checks and assistance with care.  Final Clinical Impression(s) / ED Diagnoses Final diagnoses:  COPD exacerbation ([place])  Severe comorbid illness    Rx / DC Orders ED Discharge Orders     None        Charlesetta Shanks, MD 12/09/20 1129

## 2020-12-09 NOTE — ED Triage Notes (Signed)
Patient arrived with EMS from home , family reported SOB and thick / tenacious secretions from tracheostomy this week , she has tracheostomy/ventilator dependent . No fever or chills .

## 2020-12-13 ENCOUNTER — Ambulatory Visit: Payer: Medicaid Other

## 2020-12-14 ENCOUNTER — Telehealth: Payer: Self-pay | Admitting: Emergency Medicine

## 2020-12-14 NOTE — Telephone Encounter (Signed)
Susie (home health nurse calling)  skin under both breast is irritated (cleaning w/ soap & water/ also applying zinc oxide cream)  skin on buttocks appears to be eczema flare up or contact dermatisis   Wants to know if something can be sent to pharmacy for patient

## 2020-12-15 ENCOUNTER — Other Ambulatory Visit: Payer: Self-pay | Admitting: Emergency Medicine

## 2020-12-15 MED ORDER — NYSTATIN-TRIAMCINOLONE 100000-0.1 UNIT/GM-% EX OINT: 1.0000 "application " | TOPICAL_OINTMENT | Freq: Two times a day (BID) | CUTANEOUS | 0 refills | Status: DC

## 2020-12-15 NOTE — Telephone Encounter (Signed)
May try short-term triamcinolone cream.  Prescription sent to pharmacy of record.  Thanks.

## 2020-12-16 ENCOUNTER — Other Ambulatory Visit: Payer: Self-pay | Admitting: Emergency Medicine

## 2020-12-17 ENCOUNTER — Telehealth: Payer: Self-pay | Admitting: Emergency Medicine

## 2020-12-17 ENCOUNTER — Other Ambulatory Visit: Payer: Self-pay | Admitting: Emergency Medicine

## 2020-12-17 NOTE — Telephone Encounter (Signed)
1.Medication Requested: revefenacin (YUPELRI) 175 MCG/3ML nebulizer solution metoCLOPramide (REGLAN) 10 MG/10ML SOLN   2. Pharmacy (Name, Street, [place]): [facility] [phone] - [place], [place] - [address] AT [facility] [address] & [facility]  Phone:  [phone] Fax:  [phone]   3. On Med List: yes  4. Last Visit with PCP: 06.30.22  5. Next visit date with PCP: n/a   Agent: Please be advised that RX refills may take up to 3 business days. We ask that you follow-up with your pharmacy.

## 2020-12-17 NOTE — Telephone Encounter (Signed)
Called and spoke with Susie from [facility], she verbalized understanding.

## 2020-12-19 ENCOUNTER — Telehealth: Payer: Self-pay | Admitting: Emergency Medicine

## 2020-12-19 ENCOUNTER — Other Ambulatory Visit: Payer: Self-pay | Admitting: Emergency Medicine

## 2020-12-19 NOTE — Telephone Encounter (Signed)
Thanks

## 2020-12-19 NOTE — Telephone Encounter (Signed)
Husband called stated she needs a medication substitution, the medication prescribed is not covered by her insurance  Telephone advice record scanned into the media tab

## 2020-12-19 NOTE — Telephone Encounter (Signed)
Susie (home health nurse) calling on behalf of patient  Patient is complaining of pain on right buttock (10/10)  Susie says it feels like a knot that is 7cms l & 4cms w  Wanted to let provider know  Please follow up [phone]

## 2020-12-19 NOTE — Telephone Encounter (Signed)
What medication?  Triamcinolone?  Thanks.

## 2020-12-20 ENCOUNTER — Ambulatory Visit: Payer: Medicaid Other

## 2020-12-21 NOTE — Telephone Encounter (Signed)
  Susana the [facility] nurse calling to report tiny blistering on buttocks. She feels this may be coming from patient taking Doxycycline (3rd round) and Tramadol  Nurse to e-mail pictures to scheduler  Please have covering provider advise  Please call [phone]

## 2020-12-25 ENCOUNTER — Emergency Department (HOSPITAL_COMMUNITY)
Admission: EM | Admit: 2020-12-25 | Discharge: 2020-12-25 | Disposition: A | Discharge status: Home / Self Care | Payer: Medicaid Other | Attending: Emergency Medicine | Admitting: Emergency Medicine

## 2020-12-25 ENCOUNTER — Other Ambulatory Visit (HOSPITAL_COMMUNITY): Payer: Self-pay | Admitting: Emergency Medicine

## 2020-12-25 ENCOUNTER — Emergency Department (HOSPITAL_COMMUNITY): Payer: Medicaid Other

## 2020-12-25 ENCOUNTER — Encounter (HOSPITAL_COMMUNITY): Payer: Self-pay

## 2020-12-25 DIAGNOSIS — Z7951 Long term (current) use of inhaled steroids: Secondary | ICD-10-CM | POA: Insufficient documentation

## 2020-12-25 DIAGNOSIS — I129 Hypertensive chronic kidney disease with stage 1 through stage 4 chronic kidney disease, or unspecified chronic kidney disease: Secondary | ICD-10-CM | POA: Diagnosis not present

## 2020-12-25 DIAGNOSIS — J45909 Unspecified asthma, uncomplicated: Secondary | ICD-10-CM | POA: Diagnosis not present

## 2020-12-25 DIAGNOSIS — R109 Unspecified abdominal pain: Secondary | ICD-10-CM | POA: Insufficient documentation

## 2020-12-25 DIAGNOSIS — Z79899 Other long term (current) drug therapy: Secondary | ICD-10-CM | POA: Diagnosis not present

## 2020-12-25 DIAGNOSIS — N189 Chronic kidney disease, unspecified: Secondary | ICD-10-CM | POA: Diagnosis not present

## 2020-12-25 DIAGNOSIS — R1319 Other dysphagia: Secondary | ICD-10-CM

## 2020-12-25 DIAGNOSIS — Z7901 Long term (current) use of anticoagulants: Secondary | ICD-10-CM | POA: Insufficient documentation

## 2020-12-25 DIAGNOSIS — K9423 Gastrostomy malfunction: Secondary | ICD-10-CM

## 2020-12-25 DIAGNOSIS — J449 Chronic obstructive pulmonary disease, unspecified: Secondary | ICD-10-CM | POA: Insufficient documentation

## 2020-12-25 DIAGNOSIS — E039 Hypothyroidism, unspecified: Secondary | ICD-10-CM | POA: Diagnosis not present

## 2020-12-25 MED ORDER — OXYCODONE HCL 5 MG PO TABS
5.0000 mg | ORAL_TABLET | Freq: Once | ORAL | Status: AC
Start: 2020-12-25 — End: 2020-12-25
  Administered 2020-12-25: 5 mg via ORAL
  Filled 2020-12-25: qty 1

## 2020-12-25 MED ORDER — DIATRIZOATE MEGLUMINE & SODIUM 66-10 % PO SOLN
ORAL | Status: AC
  Filled 2020-12-25: qty 30

## 2020-12-25 MED ORDER — REVEFENACIN 175 MCG/3ML IN SOLN: 175.0000 ug | Freq: Every day | RESPIRATORY_TRACT | 3 refills | Status: DC

## 2020-12-25 MED ORDER — DIATRIZOATE MEGLUMINE & SODIUM 66-10 % PO SOLN
30.0000 mL | Freq: Once | ORAL | Status: AC
  Administered 2020-12-25: 30 mL
  Filled 2020-12-25: qty 30

## 2020-12-25 MED ORDER — ACETAMINOPHEN 500 MG PO TABS
1000.0000 mg | ORAL_TABLET | Freq: Once | ORAL | Status: AC
  Administered 2020-12-25: 1000 mg via ORAL
  Filled 2020-12-25: qty 2

## 2020-12-25 MED ORDER — METOCLOPRAMIDE HCL 10 MG/10ML PO SOLN: ORAL | 1 refills | Status: DC

## 2020-12-25 NOTE — ED Notes (Signed)
Pt reports feeding tube 18 fr came totally dislodged and home health nurse replaced it, sent for verification

## 2020-12-25 NOTE — ED Notes (Signed)
Feeding tube flushed with 30cc of sterile saline without difficulty

## 2020-12-25 NOTE — Telephone Encounter (Signed)
Refilled medication

## 2020-12-25 NOTE — Addendum Note (Signed)
Addended by: Nena Polio on: 12/25/2020 11:47 AM   Modules accepted: Orders

## 2020-12-25 NOTE — ED Triage Notes (Signed)
Pt comes from home, has a dislodged feeding tube that happened today, some abd pain, pt has a trach and is vent dependent at home

## 2020-12-25 NOTE — ED Provider Notes (Signed)
[place] EMERGENCY DEPARTMENT Provider Note   CSN: [id number] Arrival date & time: 12/25/20  1932     History Chief Complaint  Patient presents with   dislodged feeding tube    Kathleen Cameron is a 55 y.o. female.  55 yo F with a chief complaints of having her G-tube removed inadvertently.  It was replaced by a nurse at her facility.  She was having some pain and discomfort so sent here to ensure it was placed properly.  She is having some discomfort there.  Denies other issue currently.  The history is provided by the patient.  Illness Severity:  Moderate Onset quality:  Sudden Duration:  2 hours Timing:  Constant Progression:  Unchanged Chronicity:  New Associated symptoms: abdominal pain   Associated symptoms: no chest pain, no congestion, no fever, no headaches, no myalgias, no nausea, no rhinorrhea, no shortness of breath, no vomiting and no wheezing       Past Medical History:  Diagnosis Date   Acute on chronic respiratory failure with hypoxia (HCC)    Anxiety disorder    Asthma    COPD (chronic obstructive pulmonary disease) (HCC)    COPD, severe (HCC)    Hypertension    Seizure disorder [facility])     Patient Active Problem List   Diagnosis Date Noted   Intractable nausea and vomiting    Severe sepsis with septic shock (CODE) ([place]) 08/29/2020   Aspiration pneumonia ([place])    Goals of care, counseling/discussion    Ileus ([place])    Tracheostomy dependence ([place])    Sepsis ([place]) 06/03/2020   Healthcare-associated pneumonia 06/03/2020   Pressure injury of skin 06/03/2020   Acute respiratory failure (HCC)    Seizure disorder (HCC)    COPD, severe (HCC)    Anxiety disorder    Generalized abdominal pain    Convulsions ([place])    Malnutrition of moderate degree 02/27/2020   Hypotension    Acute hypercapnic respiratory failure ([place]) 02/25/2020   Right upper quadrant abdominal pain 02/01/2020   Hypothyroidism (acquired) 85/46/2703   Acute  metabolic encephalopathy 50/12/3816   Hyponatremia 01/28/2020   Anxiety 12/02/2019   COPD exacerbation ([place]) 11/06/2019   Depression 11/06/2019   Acute on chronic respiratory failure with hypoxia and hypercapnia ([place]) 11/06/2019   Current moderate episode of major depressive disorder without prior episode ([place]) 09/20/2019   Pedal edema 08/04/2019   Hypokalemia 04/13/2019   Chronic respiratory failure with hypoxia ([place]) 03/07/2019   CKD (chronic kidney disease) 01/05/2019   COPD (chronic obstructive pulmonary disease) ([place]) 09/29/2018   Pulmonary nodules 09/27/2018   Dyslipidemia 07/07/2018   Essential hypertension 03/10/2018    Past Surgical History:  Procedure Laterality Date   COLONOSCOPY N/A 06/26/2020   Procedure: COLONOSCOPY;  Surgeon: Carol Ada, MD;  Location: [place];  Service: Endoscopy;  Laterality: N/A;   ENTEROSCOPY N/A 06/26/2020   Procedure: ENTEROSCOPY;  Surgeon: Carol Ada, MD;  Location: [place];  Service: Endoscopy;  Laterality: N/A;   HEMOSTASIS CLIP PLACEMENT  06/26/2020   Procedure: HEMOSTASIS CLIP PLACEMENT;  Surgeon: Carol Ada, MD;  Location: [place];  Service: Endoscopy;;   HOT HEMOSTASIS N/A 06/26/2020   Procedure: HOT HEMOSTASIS (ARGON PLASMA COAGULATION/BICAP);  Surgeon: Carol Ada, MD;  Location: [place];  Service: Endoscopy;  Laterality: N/A;   IR FLUORO RM 30-60 MIN  03/23/2020   IR REPLC GASTRO/COLONIC TUBE PERCUT W/FLUORO  09/12/2020   LAPAROSCOPIC INSERTION GASTROSTOMY TUBE N/A 04/24/2020   Procedure: LAPAROSCOPIC GASTROSTOMY TUBE PLACEMENT;  Surgeon: Kieth Brightly, Arta Bruce, MD;  Location: [place];  Service: General;  Laterality: N/A;   POLYPECTOMY  06/26/2020   Procedure: POLYPECTOMY;  Surgeon: Carol Ada, MD;  Location: [facility] ENDOSCOPY;  Service: Endoscopy;;   WISDOM TOOTH EXTRACTION       OB History   No obstetric history on file.     Family History  Family history unknown: Yes    Social History   Tobacco Use    Smoking status: Never   Smokeless tobacco: Never  Vaping Use   Vaping Use: Never used  Substance Use Topics   Alcohol use: Yes    Alcohol/week: 2.0 standard drinks    Types: 2 Cans of beer per week   Drug use: Never    Home Medications Prior to Admission medications   Medication Sig Start Date End Date Taking? Authorizing Provider  acetaminophen (TYLENOL) 325 MG tablet Place 325 mg into feeding tube every 6 (six) hours as needed for moderate pain or headache.    [provider]  amLODipine (NORVASC) 10 MG tablet Place 1 tablet (10 mg total) into feeding tube daily. 09/18/20   Mercy Riding, MD  arformoterol (BROVANA) 15 MCG/2ML NEBU INHALE 2 MLS(1 VIAL) VIA NEBULIZATION TWICE DAILY Patient taking differently: Take 15 mcg by nebulization in the morning and at bedtime. 11/16/20   Collene Gobble, MD  bisacodyl (DULCOLAX) 10 MG suppository Place 1 suppository (10 mg total) rectally daily as needed for severe constipation. 07/10/20   Horald Pollen, MD  budesonide (PULMICORT) 0.25 MG/2ML nebulizer solution Take 2 mLs (0.25 mg total) by nebulization 2 (two) times daily. 07/10/20   Candee Furbish, MD  Chlorhexidine Gluconate Cloth 2 % PADS Apply 6 each topically daily. 08/21/20   Horald Pollen, MD  chlorhexidine gluconate, MEDLINE KIT, (PERIDEX) 0.12 % solution 15 mLs by Mouth Rinse route 2 (two) times daily. 07/10/20   Candee Furbish, MD  clonazePAM (KLONOPIN) 1 MG tablet PLACE 1/2 TABLET INTO FEEDING TUBE THREE TIMES DAILY AS NEEDED FOR ANXIETY Patient taking differently: Place 0.5 mg into feeding tube 3 (three) times daily as needed for anxiety. 12/06/20   Horald Pollen, MD  doxycycline (MONODOX) 100 MG capsule Take 1 capsule (100 mg total) by mouth 2 (two) times daily. Patient taking differently: Place 100 mg into feeding tube 2 (two) times daily. 11/15/20   Binnie Rail, MD  ELIQUIS 5 MG TABS tablet PLACE 1 TABLET INTO FEEDING TUBE TWICE DAILY 12/17/20   Horald Pollen, MD  ipratropium-albuterol (DUONEB) 0.5-2.5 (3) MG/3ML SOLN INAHEL CONTENTS OF 1 VIAL PER NEBULIZER EVER 4 HOURS Patient taking differently: Take 3 mLs by nebulization every 4 (four) hours as needed (sob/wheezing). 10/18/20 01/16/21  Collene Gobble, MD  levothyroxine (SYNTHROID) 88 MCG tablet Take 1 tablet (88 mcg total) by mouth daily at 6 (six) AM. Patient taking differently: Place 88 mcg into feeding tube daily at 6 (six) AM. 11/19/20   Sagardia, Ines Bloomer, MD  lip balm (CARMEX) ointment Apply topically as needed for lip care. 03/08/20   Candee Furbish, MD  metoCLOPramide (REGLAN) 10 MG/10ML SOLN PLAVE 10 MLS INTO FEEDING TUBE EVERY MORNING AND AT BEDTIME 12/25/20   Horald Pollen, MD  Nutritional Supplements (ENSURE ACTIVE HIGH PROTEIN) LIQD Take 1 Can by mouth in the morning and at bedtime. 07/09/20   Barb Merino, MD  Nutritional Supplements (FEEDING SUPPLEMENT, OSMOLITE 1.2 CAL,) LIQD Place 1,000 mLs into feeding tube continuous. 07/10/20  Horald Pollen, MD  nystatin-triamcinolone ointment [facility]) Apply 1 application topically 2 (two) times daily. 12/15/20   Horald Pollen, MD  pantoprazole (PROTONIX) 40 MG tablet TAKE 1 TABLET(40 MG) BY MOUTH DAILY Patient taking differently: 40 mg daily. Per tube 11/19/20   Sagardia, Ines Bloomer, MD  phenol (CHLORASEPTIC) 1.4 % LIQD Use as directed 1 spray in the mouth or throat as needed for throat irritation / pain. 03/08/20   Candee Furbish, MD  polyethylene glycol (MIRALAX / GLYCOLAX) 17 g packet Place 17 g into feeding tube daily as needed for mild constipation. 03/08/20   Candee Furbish, MD  revefenacin (YUPELRI) 175 MCG/3ML nebulizer solution Take 3 mLs (175 mcg total) by nebulization daily. 12/25/20   Horald Pollen, MD  rosuvastatin (CRESTOR) 20 MG tablet TAKE 1 TABLET(20 MG) BY MOUTH DAILY Patient taking differently: Place 20 mg into feeding tube every evening. 11/14/20   Horald Pollen, MD   senna-docusate (SENOKOT-S) 8.6-50 MG tablet Place 2 tablets into feeding tube at bedtime as needed for moderate constipation. 11/27/20   McDonald, Mia A, PA-C  simethicone (MYLICON) 40 EN/2.7PO drops Place 0.6 mLs (40 mg total) into feeding tube every 6 (six) hours. Patient not taking: Reported on 12/09/2020 09/18/20   Mercy Riding, MD  Simethicone 125 MG CAPS Give 125 mg by tube daily.    [provider]  sodium chloride (OCEAN) 0.65 % SOLN nasal spray Place 1 spray into both nostrils as needed for congestion. 03/08/20   Candee Furbish, MD  sorbitol 70 % SOLN Place 30 mLs into feeding tube daily as needed for moderate constipation. 07/25/20   Candee Furbish, MD  temazepam (RESTORIL) 15 MG capsule Place 1 capsule (15 mg total) into feeding tube at bedtime as needed for sleep. 10/29/20   Horald Pollen, MD  traMADol (ULTRAM) 50 MG tablet Take 1 tablet (50 mg total) by mouth 3 (three) times daily as needed. Patient taking differently: Place 50 mg into feeding tube 3 (three) times daily as needed for moderate pain or severe pain. 11/15/20   Binnie Rail, MD  venlafaxine (EFFEXOR) 37.5 MG tablet Place 1 tablet (37.5 mg total) into feeding tube 2 (two) times daily with a meal. 07/20/20 12/09/20  [place], Ines Bloomer, MD    Allergies    Stiolto respimat [tiotropium bromide-olodaterol]  Review of Systems   Review of Systems  Constitutional:  Negative for chills and fever.  HENT:  Negative for congestion and rhinorrhea.   Eyes:  Negative for redness and visual disturbance.  Respiratory:  Negative for shortness of breath and wheezing.   Cardiovascular:  Negative for chest pain and palpitations.  Gastrointestinal:  Positive for abdominal pain. Negative for nausea and vomiting.  Genitourinary:  Negative for dysuria and urgency.  Musculoskeletal:  Negative for arthralgias and myalgias.  Skin:  Negative for pallor and wound.  Neurological:  Negative for dizziness and headaches.   Physical  Exam Updated Vital Signs BP (!) 165/110   Pulse (!) 106   Temp 98.5 F (36.9 C) (Oral)   Resp 19   SpO2 100%   Physical Exam Vitals and nursing note reviewed.  Constitutional:      General: She is not in acute distress.    Appearance: She is well-developed. She is not diaphoretic.  HENT:     Head: Normocephalic and atraumatic.  Eyes:     Pupils: Pupils are equal, round, and reactive to light.  Cardiovascular:  Rate and Rhythm: Normal rate and regular rhythm.     Heart sounds: No murmur heard.   No friction rub. No gallop.  Pulmonary:     Effort: Pulmonary effort is normal.     Breath sounds: No wheezing or rales.  Abdominal:     General: There is no distension.     Palpations: Abdomen is soft.     Tenderness: There is no abdominal tenderness.     Comments: No abdominal tenderness.  G-tube appears to be in good position with an inflated balloon and is easily advanced and retracted.  Flushes easily at bedside.  Musculoskeletal:        General: No tenderness.     Cervical back: Normal range of motion and neck supple.  Skin:    General: Skin is warm and dry.  Neurological:     Mental Status: She is alert and oriented to person, place, and time.  Psychiatric:        Behavior: Behavior normal.    ED Results / Procedures / Treatments   Labs (all labs ordered are listed, but only abnormal results are displayed) Labs Reviewed - No data to display  EKG None  Radiology DG ABDOMEN PEG TUBE LOCATION  Result Date: 12/25/2020 CLINICAL DATA:  Feeding tube placement. EXAM: ABDOMEN - 1 VIEW COMPARISON:  Abdominal x-ray 09/15/2020. CT chest 12/09/2020. FINDINGS: Gastrostomy tube overlies the left upper quadrant. Contrast is seen within nondilated stomach. No dilated bowel loops are visualized. There is no obvious extravasation of contrast. Surgical clip overlies the right pelvis. There are phleboliths in the pelvis. No acute fractures are seen. There is blunting of the costophrenic  angles bilaterally similar to the prior examination. This corresponds to scarring on CT. IMPRESSION: 1. There is contrast seen within nondilated stomach. This confirms placement of gastrostomy tube within the stomach. Electronically Signed   By: Ronney Asters M.D.   On: 12/25/2020 20:33    Procedures Procedures   Medications Ordered in ED Medications  diatrizoate meglumine-sodium (GASTROGRAFIN) 66-10 % solution 30 mL (30 mLs Per Tube Given 12/25/20 2014)  acetaminophen (TYLENOL) tablet 1,000 mg (1,000 mg Oral Given 12/25/20 2001)  oxyCODONE (Oxy IR/ROXICODONE) immediate release tablet 5 mg (5 mg Oral Given 12/25/20 2001)    ED Course  I have reviewed the triage vital signs and the nursing notes.  Pertinent labs & imaging results that were available during my care of the patient were reviewed by me and considered in my medical decision making (see chart for details).    MDM Rules/Calculators/A&P                           55 yo F who is trach and vent dependent here for evaluation of a dislodged and then replaced G-tube.  She is having some discomfort at the site.  Seems most likely to be in good position will confirm with an x-ray.  X-ray viewed by me and appears to be in good position.  Will discharge home.  8:54 PM:  I have discussed the diagnosis/risks/treatment options with the patient and believe the pt to be eligible for discharge home to follow-up with PCP. We also discussed returning to the ED immediately if new or worsening sx occur. We discussed the sx which are most concerning (e.g., sudden worsening pain, fever, inability to tolerate by mouth) that necessitate immediate return. Medications administered to the patient during their visit and any new prescriptions provided to the patient  are listed below.  Medications given during this visit Medications  diatrizoate meglumine-sodium (GASTROGRAFIN) 66-10 % solution 30 mL (30 mLs Per Tube Given 12/25/20 2014)  acetaminophen (TYLENOL)  tablet 1,000 mg (1,000 mg Oral Given 12/25/20 2001)  oxyCODONE (Oxy IR/ROXICODONE) immediate release tablet 5 mg (5 mg Oral Given 12/25/20 2001)     The patient appears reasonably screen and/or stabilized for discharge and I doubt any other medical condition or other [facility] requiring further screening, evaluation, or treatment in the ED at this time prior to discharge.   Final Clinical Impression(s) / ED Diagnoses Final diagnoses:  PEG tube malfunction [facility])    Rx / DC Orders ED Discharge Orders     None        Deno Etienne, DO 12/25/20 2054

## 2020-12-25 NOTE — Discharge Instructions (Addendum)
Follow up with your doctor in the office.  Return for worsening pain, difficulty using your tube.

## 2020-12-26 ENCOUNTER — Other Ambulatory Visit: Payer: Self-pay

## 2020-12-26 ENCOUNTER — Ambulatory Visit (HOSPITAL_COMMUNITY)
Admission: RE | Admit: 2020-12-26 | Discharge: 2020-12-26 | Disposition: A | Discharge status: Home / Self Care | Payer: Medicaid Other | Source: Ambulatory Visit | Attending: Emergency Medicine | Admitting: Emergency Medicine

## 2020-12-26 DIAGNOSIS — Z431 Encounter for attention to gastrostomy: Secondary | ICD-10-CM | POA: Diagnosis not present

## 2020-12-26 DIAGNOSIS — R1319 Other dysphagia: Secondary | ICD-10-CM | POA: Insufficient documentation

## 2020-12-26 HISTORY — PX: IR REPLC GASTRO/COLONIC TUBE PERCUT W/FLUORO: IMG2333

## 2020-12-26 MED ORDER — IOHEXOL 240 MG/ML SOLN: 50.0000 mL | Freq: Once | INTRAMUSCULAR | Status: DC | PRN

## 2020-12-26 MED ORDER — LIDOCAINE VISCOUS HCL 2 % MT SOLN
OROMUCOSAL | Status: AC
  Filled 2020-12-26: qty 15

## 2020-12-26 MED ORDER — IOHEXOL 240 MG/ML SOLN
50.0000 mL | Freq: Once | INTRAMUSCULAR | Status: AC | PRN
  Administered 2020-12-26: 10 mL via INTRAVENOUS

## 2020-12-26 NOTE — Procedures (Signed)
Pre procedural Dx: Inadvertent removal of feeding G tube.  Post procedural Dx: Same  Successful fluoroscopic guided replacement of existing 18 Fr gastrostomy tube.   The feeding tube is ready for immediate use.  EBL: None  Complications: None immediate.  Katherina Right, MD Pager #: [phone]

## 2021-01-01 ENCOUNTER — Other Ambulatory Visit: Payer: Self-pay

## 2021-01-01 ENCOUNTER — Other Ambulatory Visit: Payer: Self-pay | Admitting: Emergency Medicine

## 2021-01-01 ENCOUNTER — Ambulatory Visit: Payer: Medicaid Other | Admitting: Emergency Medicine

## 2021-01-01 ENCOUNTER — Telehealth: Payer: Self-pay

## 2021-01-01 MED ORDER — HYDROXYZINE HCL 25 MG PO TABS: 25.0000 mg | ORAL_TABLET | Freq: Every evening | ORAL | 1 refills | Status: DC | PRN

## 2021-01-01 NOTE — Telephone Encounter (Signed)
ATC pt left voice mail that OV today needed to be rescheduled. It pt calls back/ shows up in office please reschedule for 1st available OV. 

## 2021-01-01 NOTE — Telephone Encounter (Signed)
Please advise as Susana with [name] has called stating the pt is taking the clonazepam for her anxiety during the day and is taking the temazepam (RESTORIL) 15 MG capsule at night for sleep. Angelena Sole is asking that will there be another medication rx'd in place of the temazepam for sleep?  Susana with [name] can be reached [phone].

## 2021-01-01 NOTE — Telephone Encounter (Signed)
She has to be careful with excessive benzodiazepines.  Both temazepam and clonazepam are benzodiazepines. Will send a new prescription for Vistaril 25 mg to be taken at night as needed.  Thanks.

## 2021-01-02 NOTE — Telephone Encounter (Signed)
I was able to speak with Kathleen Cameron and inform her of Dr. Latrelle Dodrill instructions. She has stated she understands and will update pt a/w her family about the medication changes.

## 2021-01-06 IMAGING — DX CHEST - 2 VIEW
2 series · 2 of 2 positions shown · non-contrast
Comparison: 09/23/2018

CLINICAL DATA: Shortness of breath, left flank pain

EXAM:
CHEST - 2 VIEW

[chest pa]
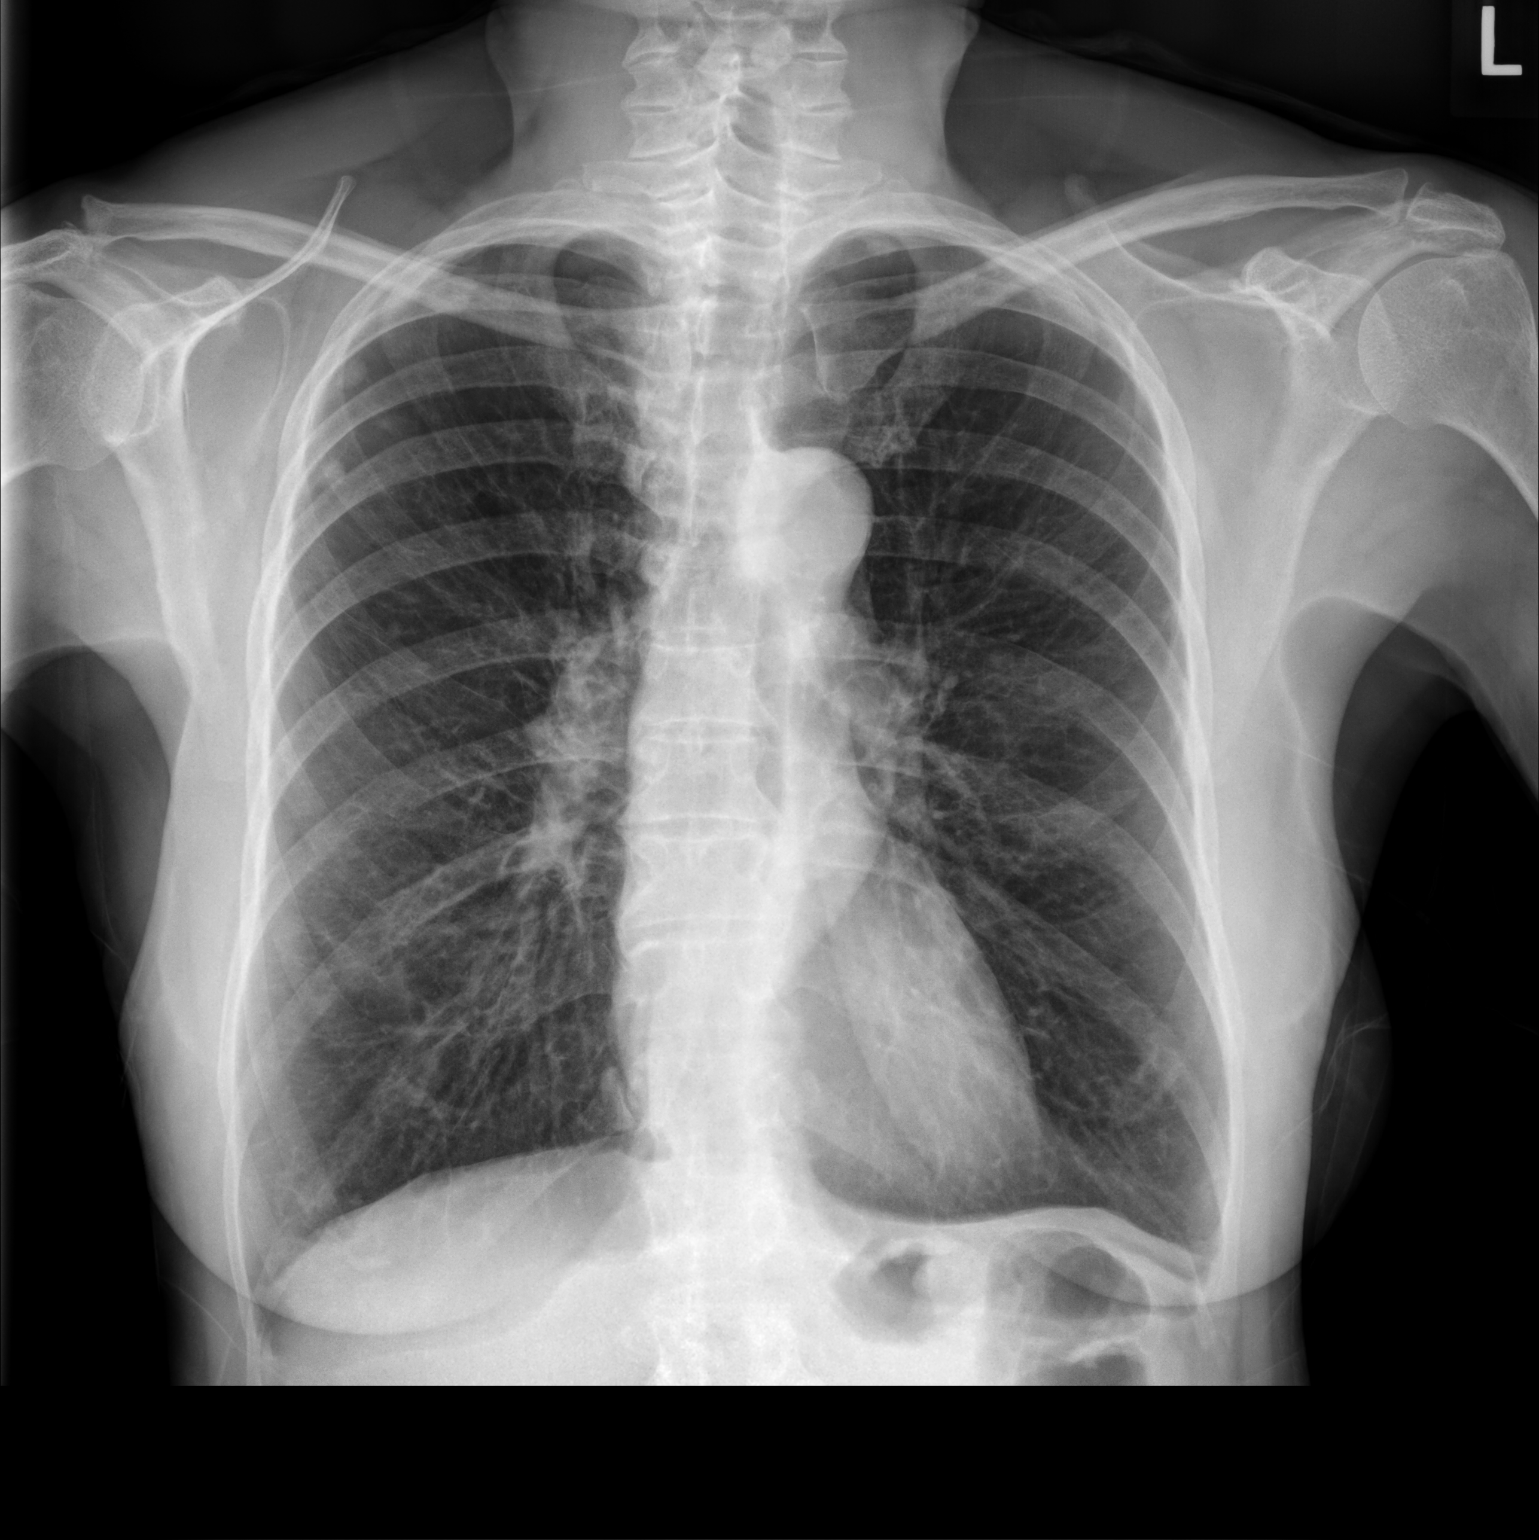

[chest lat]
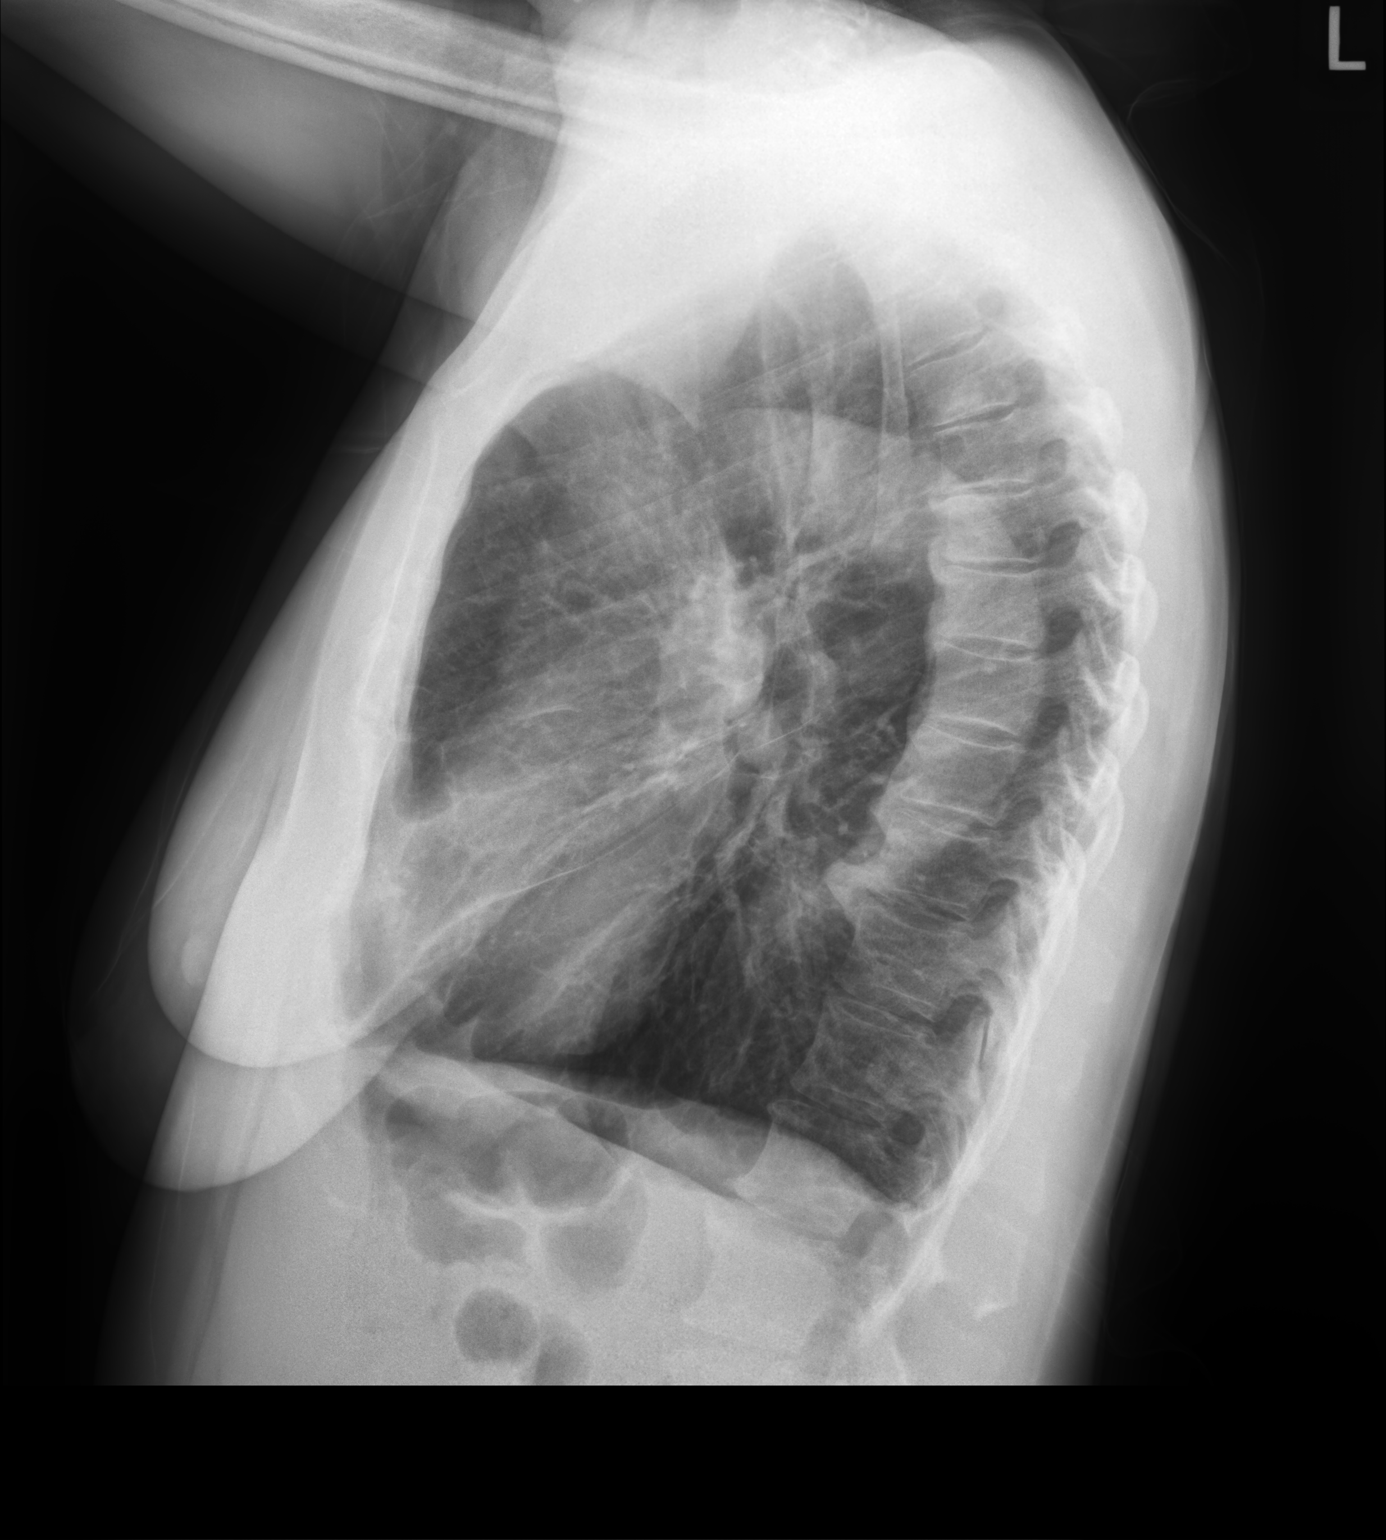

[2 of 2 positions shown; findings below may reference images not displayed]

FINDINGS: Multiple rounded nodules are again noted in the upper lobes
bilaterally, stable since prior study and prior CT. No confluent
airspace opacities. Heart is normal size. No effusions. No acute
bony abnormality.
IMPRESSION: No active cardiopulmonary disease.

## 2021-01-08 ENCOUNTER — Telehealth: Payer: Self-pay | Admitting: Emergency Medicine

## 2021-01-08 NOTE — Telephone Encounter (Signed)
Patient been on doxycycline (MONODOX) 100 MG capsule for about a month due to secretions  Done w/ meds... wants to know if patient needs to continue taking meds & if so will provider send in another rx to pharmacy  Please follow up

## 2021-01-08 NOTE — Telephone Encounter (Signed)
No more need for antibiotic.  Thanks.

## 2021-01-09 ENCOUNTER — Encounter: Payer: Self-pay | Admitting: Emergency Medicine

## 2021-01-09 ENCOUNTER — Telehealth: Payer: Self-pay

## 2021-01-09 ENCOUNTER — Other Ambulatory Visit: Payer: Self-pay | Admitting: Emergency Medicine

## 2021-01-09 MED ORDER — HYDROCORTISONE 2.5 % EX CREA: TOPICAL_CREAM | Freq: Two times a day (BID) | CUTANEOUS | 0 refills | Status: DC

## 2021-01-09 NOTE — Telephone Encounter (Signed)
Recommend over the counter hydrocortisone

## 2021-01-09 NOTE — Telephone Encounter (Signed)
Called and spoke with Susie from [facility], she states that the pt has developed a rash under breast.  A prescription for Triamcinolone was sent to pharmacy, but was not covered by insurance. Is there an alternative medication that can be sent?

## 2021-01-09 NOTE — Telephone Encounter (Signed)
Spoke with [name] patients care giver.  Advised them to use hydrocortisone.  They were wondering if ointment can be sent in separate instead that way insurance will cover it.  They have been putting lotrimin on the area.

## 2021-01-09 NOTE — Telephone Encounter (Signed)
Please return to to [facility] nurse [name] at [phone]  She has questions about an ointment

## 2021-01-09 NOTE — Telephone Encounter (Signed)
New prescription sent to pharmacy of record

## 2021-01-10 ENCOUNTER — Other Ambulatory Visit: Payer: Self-pay | Admitting: Emergency Medicine

## 2021-01-10 DIAGNOSIS — B372 Candidiasis of skin and nail: Secondary | ICD-10-CM

## 2021-01-10 MED ORDER — CLOTRIMAZOLE-BETAMETHASONE 1-0.05 % EX CREA: 1.0000 "application " | TOPICAL_CREAM | Freq: Two times a day (BID) | CUTANEOUS | 1 refills | Status: AC

## 2021-01-10 NOTE — Telephone Encounter (Signed)
Patient aware and see mychart message.

## 2021-01-10 NOTE — Telephone Encounter (Signed)
Informed patient that hydrocortisone rx was sent to pharmacy but they wanted provider to be aware and sent pictures.  They want to confirm hydrocortisone is appropriate for this.

## 2021-01-10 NOTE — Telephone Encounter (Signed)
Prefer to use Lotrisone.  Looks like a yeast infection. Will send new prescription to pharmacy of record.  Thanks.

## 2021-01-11 NOTE — Telephone Encounter (Signed)
Called and left a vm to return call

## 2021-01-13 ENCOUNTER — Other Ambulatory Visit: Payer: Self-pay | Admitting: Emergency Medicine

## 2021-01-13 DIAGNOSIS — E039 Hypothyroidism, unspecified: Secondary | ICD-10-CM

## 2021-01-15 ENCOUNTER — Telehealth (INDEPENDENT_AMBULATORY_CARE_PROVIDER_SITE_OTHER): Payer: Medicaid Other | Admitting: Emergency Medicine

## 2021-01-15 ENCOUNTER — Encounter: Payer: Self-pay | Admitting: Emergency Medicine

## 2021-01-15 DIAGNOSIS — J9611 Chronic respiratory failure with hypoxia: Secondary | ICD-10-CM

## 2021-01-15 DIAGNOSIS — J449 Chronic obstructive pulmonary disease, unspecified: Secondary | ICD-10-CM | POA: Diagnosis not present

## 2021-01-15 NOTE — Assessment & Plan Note (Signed)
Continue current vent settings > 480x16, peep 5, O2 5-6L/min Needs regular f/u trach clinic for changes, currently w #8 cuffed Shiley. ? Any role to downsize - doubt it given her secretion burden.

## 2021-01-15 NOTE — Assessment & Plan Note (Signed)
Plan to continue current nebulizer regimen Brovana, Pulmicort, Yupelri, DuoNeb as needed Add azithromycin 250 mg once daily. May need to consider addition of steroids at some point going forward.

## 2021-01-15 NOTE — Progress Notes (Signed)
Virtual Visit via Video Note  I connected with Kathleen Cameron on 01/15/21 at  3:15 PM EDT by a video enabled telemedicine application and verified that I am speaking with the correct person using two identifiers.  Location: Patient: Home with her [facility] RN Provider: Office   I discussed the limitations of evaluation and management by telemedicine and the availability of in person appointments. The patient expressed understanding and agreed to proceed.  History of Present Illness: 55 year old woman with chronic respiratory failure and tracheostomy, PEG tube due to very severe COPD.  She uses a home ventilator.  She is seen in our trach clinic.  She has had a few exacerbations since last time I have seen her, also has been seen in the ED for dislodged trach, nonfunctional PEG tube.   Observations/Objective: On Brovana, Pulmicort, Yupelri on a schedule, DuoNeb as needed Today she is with her [facility] RN, reports good compliance w her nebs Some labored breathing intermittently. Some increased mucous, increased oral secretions and mucous as well for about 2 week. Suctioning about 4+ times  a day for last 2 weeks. Increased brownish secretions suctioned last night. No blood. She has intermittent wheeze, responds to albuterol.  Vent is set on Vt 480cc, RR 16, PEEP 5, O2 at 2-6L/min.  She has a #8 Shiley cuffed trach, but her DME has discontinued them ??   Assessment and Plan: .Chronic respiratory failure with hypoxia (HCC) Continue current vent settings > 480x16, peep 5, O2 5-6L/min Needs regular f/u trach clinic for changes, currently w #8 cuffed Shiley. ? Any role to downsize - doubt it given her secretion burden.   COPD, severe (HCC) Plan to continue current nebulizer regimen Brovana, Pulmicort, Yupelri, DuoNeb as needed Add azithromycin 250 mg once daily. May need to consider addition of steroids at some point going forward.   Follow Up Instructions: 4 months video   I discussed the  assessment and treatment plan with the patient. The patient was provided an opportunity to ask questions and all were answered. The patient agreed with the plan and demonstrated an understanding of the instructions.   The patient was advised to call back or seek an in-person evaluation if the symptoms worsen or if the condition fails to improve as anticipated.  I provided 30 minutes of non-face-to-face time during this encounter.   Leslye Peer, MD

## 2021-01-16 ENCOUNTER — Telehealth: Payer: Self-pay | Admitting: Emergency Medicine

## 2021-01-16 MED ORDER — AZITHROMYCIN 250 MG PO TABS: ORAL_TABLET | ORAL | 1 refills | Status: DC

## 2021-01-16 NOTE — Telephone Encounter (Signed)
Zithromax sent to [facility] on [address]. Left message for Zigmund Daniel nurse to return call.

## 2021-01-18 ENCOUNTER — Inpatient Hospital Stay (HOSPITAL_COMMUNITY)
Admission: RE | Admit: 2021-01-18 | Discharge: 2021-01-18 | Disposition: A | Discharge status: Home / Self Care | Payer: Medicaid Other | Source: Ambulatory Visit

## 2021-01-19 IMAGING — CR PORTABLE CHEST - 1 VIEW
1 series · 1 of 1 positions shown · non-contrast
Comparison: Radiograph 12/07/2018. Chest CT 09/23/2018

CLINICAL DATA: Shortness of breath. Hypoxemia.

EXAM:
PORTABLE CHEST 1 VIEW

[AP]
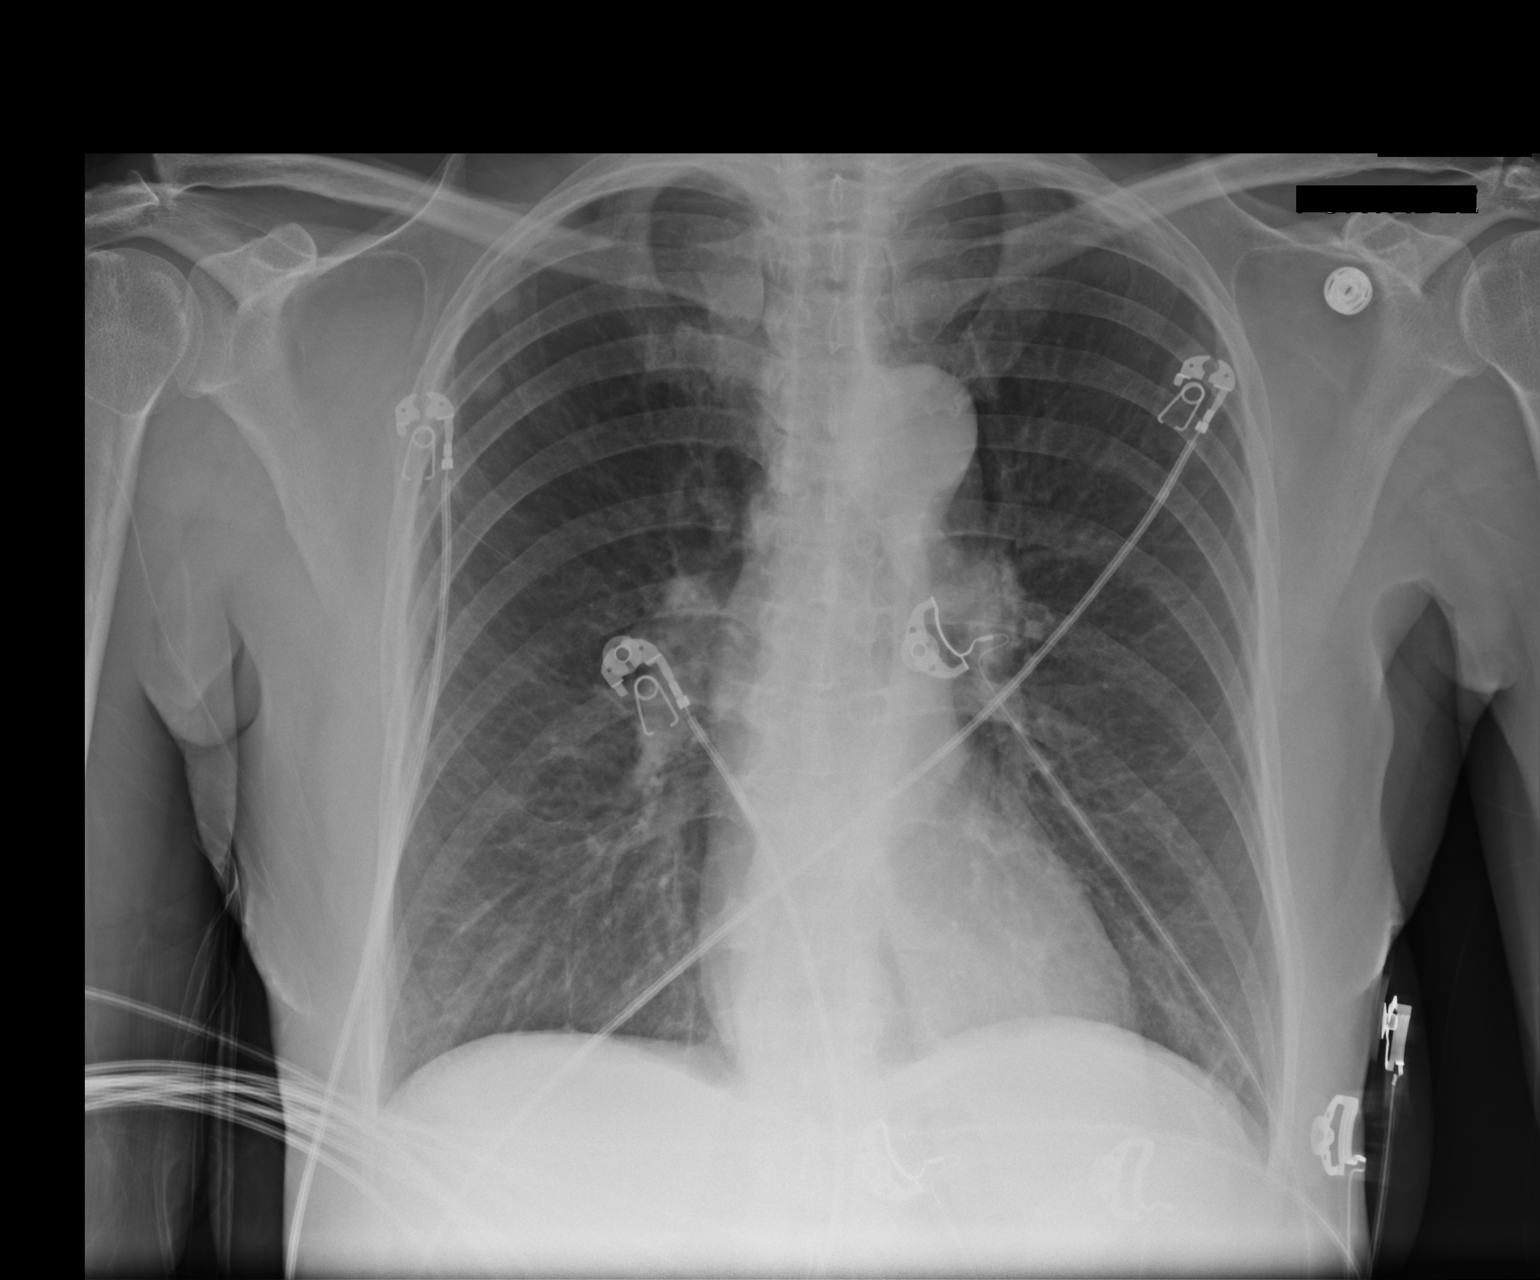

[1 of 1 positions shown; findings below may reference images not displayed]

FINDINGS: The cardiomediastinal contours are unchanged. Heart is normal in
size. Right upper lobe pulmonary nodules which are stable
radiographically. Additional pulmonary nodules on CT are not well
visualized. Pulmonary vasculature is normal. No consolidation,
pleural effusion, or pneumothorax. No acute osseous abnormalities
are seen.
IMPRESSION: No acute findings. Pulmonary nodules in the right upper lung which
are stable radiographically from prior imaging.

## 2021-01-21 ENCOUNTER — Other Ambulatory Visit: Payer: Self-pay | Admitting: Internal Medicine

## 2021-01-22 ENCOUNTER — Telehealth: Payer: Self-pay | Admitting: Emergency Medicine

## 2021-01-22 ENCOUNTER — Ambulatory Visit (HOSPITAL_COMMUNITY)
Admission: RE | Admit: 2021-01-22 | Discharge: 2021-01-22 | Disposition: A | Discharge status: Home / Self Care | Payer: Medicaid Other | Source: Ambulatory Visit | Attending: Acute Care | Admitting: Acute Care

## 2021-01-22 ENCOUNTER — Other Ambulatory Visit: Payer: Self-pay

## 2021-01-22 DIAGNOSIS — J9610 Chronic respiratory failure, unspecified whether with hypoxia or hypercapnia: Secondary | ICD-10-CM | POA: Diagnosis not present

## 2021-01-22 DIAGNOSIS — Z9911 Dependence on respirator [ventilator] status: Secondary | ICD-10-CM | POA: Insufficient documentation

## 2021-01-22 DIAGNOSIS — Z930 Tracheostomy status: Secondary | ICD-10-CM

## 2021-01-22 NOTE — Progress Notes (Signed)
Tracheostomy Procedure Note  Kathleen Cameron [id number] 12-17-1965  Pre Procedure Tracheostomy Information  Trach Brand: Shiley Size: 8.0  SCT Style: Cuffed Secured by: Velcro   Procedure:Trach cleaning and trach change Lidocaine neb for trach change    Post Procedure Tracheostomy Information  Trach Brand: Shiley Size:  8.0 SCT Style: Cuffed Secured by: Velcro   Post Procedure Evaluation:  ETCO2 positive color change from yellow to purple : Yes.   Vital signs:VSS Patients current condition: stable Complications: No apparent complications Trach site exam: clean, dry Wound care done: 4 x 4 gauze Patient did tolerate procedure well.   Education: none Prescription needs: none    Additional needs: none

## 2021-01-22 NOTE — Telephone Encounter (Signed)
New prescription was sent earlier today.  Thanks.

## 2021-01-22 NOTE — Telephone Encounter (Signed)
1.Medication Requested:traMADol (ULTRAM) 50 MG tablet  2. Pharmacy (Name, Street, [place]): [facility] [phone] - [place], [place] - [address] AT [facility] [address] & [facility] Phone:  [phone]  Fax:  [phone]      3. On Med List: Y  4. Last Visit with PCP: 6.30.22  5. Next visit date with PCP: not scheduled   [facility] note: Caller asks for wife's Rx Trammadol to be refilled. Needs new auth. Husband cannot say exactly what kind of pain, suggests widespread. Mentions that she has home health RNs that dose it at night to allow her to sleep better

## 2021-01-23 ENCOUNTER — Telehealth: Payer: Self-pay | Admitting: Emergency Medicine

## 2021-01-23 DIAGNOSIS — Z930 Tracheostomy status: Secondary | ICD-10-CM

## 2021-01-23 NOTE — Telephone Encounter (Signed)
Call returned to [facility] with [facility]. Confirmed patient DOB. She is new to patient and just wanted to provide an update regarding her oxygen saturations.   She reports her oxygen orders states she should be using 2-6 liters as needed. Per her care plan monitor oxygen prn if unable to maintain via ventilator use 2-6 liters as needed. She feels like when the patient is using oxygen that high at 6 liters it seems to be too much oxygen. She states currently 3.5 liters and they have seen a improvement with her. RN Susie feels like she does better at lower levels and at 3.5 liters her oxygen 95-100%. She reports no distress or SOB noted at 3.5. She states at 6 liters she is less interactive and aware of her surroundings.   She is requesting a verbal order for oxygen 2-3 liters via ventilator as needed and contact MD office if oxygen saturations are dropping below 88% on these settings.   Also requesting a DME order be sent to [employer] for below: shiley 8 sct cuff  single cannula trach  RB please advise. Thanks

## 2021-01-23 NOTE — Progress Notes (Signed)
[facility]   Reason for visit:  Trach change HPI:  55 year old female with history of chronic respiratory failure and ventilator dependence in the setting of advanced/end-stage chronic struct of pulmonary disease she has managed at home on mechanical ventilator and followed by Dr. [name] in our clinic.  She presents today for planned and routine tracheostomy change. ROS  History obtained from [facility] RN and the patient General ROS: negative for - fatigue, fever, or hot flashes ENT ROS: No neck pain, no tracheal drainage, no discharge.  Does complain of tracheostomy frequently becoming dislodged from ventilator Endocrine ROS: trach mentioned above  Respiratory ROS: no cough, shortness of breath, or wheezing Cardiovascular ROS: no chest pain or dyspnea on exertion Gastrointestinal ROS: no abdominal pain, change in bowel habits, or black or bloody stools Musculoskeletal ROS: negative Vital signs:  Reviewed: Pulse Ox mid 90s to 100  Exam:  no distress ENT exam normal, no neck nodes or sinus tenderness and trach site unremarkable  clear to auscultation bilaterally regular rate and rhythm Abdomen soft and nontender without distention, masses , no wound infection noted. extremities normal, atraumatic, no cyanosis or edema Alert and oriented x 3, gait normal., reflexes normal and symmetric, strength and  sensation grossly normal  Trach change/procedure:  trach change shiley size  8 sct cuffed Wound appearance: unremarkable  Placement confirmed w/ ETCO2      Impression/dx  Trach dependent  Vent dependent  Copd  Discussion  Doing well from trach standpoint. The trach is cuffed and fairly difficult to place. She also is frustrated as it often gets dislodged from her vent circuit. It turns out Shiley will be discontinuing the sct Model so we are in process of trying to find optimal alternative.  Plan  Cont routine trach care Plan for trach change 8 weeks Be  sure to give lido neb before and after change  Cont all prior rx Will likely go w/ cuffed portex.     Visit time: 32 minutes.   Simonne Martinet ACNP-BC [facility] Pulmonary/Critical Care

## 2021-01-23 NOTE — Telephone Encounter (Signed)
Attempted to call Kathleen Cameron with [name] but unable to reach. Left message for her to return call.

## 2021-01-23 NOTE — Telephone Encounter (Signed)
Yes, ok to send both orders

## 2021-01-23 NOTE — Telephone Encounter (Signed)
Spoke with [name] and notified of response per RB  She verbalized understanding  I sent order for trach supplies  Nothing further needed

## 2021-01-24 ENCOUNTER — Telehealth: Payer: Self-pay | Admitting: Emergency Medicine

## 2021-01-24 NOTE — Telephone Encounter (Signed)
Thank you :)

## 2021-01-25 ENCOUNTER — Telehealth: Payer: Self-pay | Admitting: Emergency Medicine

## 2021-01-25 ENCOUNTER — Other Ambulatory Visit: Payer: Self-pay | Admitting: *Deleted

## 2021-01-25 MED ORDER — BUDESONIDE 0.25 MG/2ML IN SUSP: 0.2500 mg | Freq: Two times a day (BID) | RESPIRATORY_TRACT | 12 refills | Status: AC

## 2021-01-25 NOTE — Telephone Encounter (Signed)
LMTCB. Will need more information.

## 2021-01-29 ENCOUNTER — Ambulatory Visit (INDEPENDENT_AMBULATORY_CARE_PROVIDER_SITE_OTHER): Payer: Medicaid Other | Admitting: Emergency Medicine

## 2021-01-29 ENCOUNTER — Other Ambulatory Visit: Payer: Self-pay

## 2021-01-29 VITALS — BP 146/70 | HR 76 | Ht 65.0 in | Wt 117.0 lb

## 2021-01-29 DIAGNOSIS — Z23 Encounter for immunization: Secondary | ICD-10-CM | POA: Diagnosis not present

## 2021-01-29 DIAGNOSIS — I10 Essential (primary) hypertension: Secondary | ICD-10-CM

## 2021-01-29 DIAGNOSIS — J449 Chronic obstructive pulmonary disease, unspecified: Secondary | ICD-10-CM | POA: Diagnosis not present

## 2021-01-29 DIAGNOSIS — Z930 Tracheostomy status: Secondary | ICD-10-CM | POA: Diagnosis not present

## 2021-01-29 DIAGNOSIS — J9611 Chronic respiratory failure with hypoxia: Secondary | ICD-10-CM

## 2021-01-29 MED ORDER — CHLORHEXIDINE GLUCONATE 0.12% ORAL RINSE (MEDLINE KIT): 15.0000 mL | Freq: Two times a day (BID) | OROMUCOSAL | 2 refills | Status: DC

## 2021-01-29 MED ORDER — TRAMADOL HCL 50 MG PO TABS: ORAL_TABLET | ORAL | 1 refills | Status: DC

## 2021-01-29 NOTE — Patient Instructions (Signed)

## 2021-01-30 ENCOUNTER — Encounter: Payer: Self-pay | Admitting: Emergency Medicine

## 2021-01-30 DIAGNOSIS — J9611 Chronic respiratory failure with hypoxia: Secondary | ICD-10-CM

## 2021-01-30 NOTE — Progress Notes (Signed)
Kathleen Cameron 55 y.o.   Chief Complaint  Patient presents with   Follow-up    Chronic conditions, abd pain    HISTORY OF PRESENT ILLNESS: This is a 55 y.o. female here for follow-up of chronic medical conditions. Patient is ventilator dependent and has permanent tracheostomy.  Brought in today by EMS personal. Patient has history of end-stage COPD with chronic respiratory failure. Gets occasional episodes of lower abdominal pain. Visiting nurse here with patient.  She knows patient well and has been taking care of her for the past several months. Has some questions about tramadol prescription. Medication list reviewed with nurse. Patient stable and doing well lately.  HPI   Prior to Admission medications   Medication Sig Start Date End Date Taking? Authorizing Provider  acetaminophen (TYLENOL) 325 MG tablet Place 325 mg into feeding tube every 6 (six) hours as needed for moderate pain or headache.   Yes [provider]  amLODipine (NORVASC) 10 MG tablet Place 1 tablet (10 mg total) into feeding tube daily. 09/18/20  Yes Mercy Riding, MD  arformoterol (BROVANA) 15 MCG/2ML NEBU INHALE 2 MLS(1 VIAL) VIA NEBULIZATION TWICE DAILY Patient taking differently: Take 15 mcg by nebulization in the morning and at bedtime. 11/16/20  Yes Collene Gobble, MD  azithromycin (ZITHROMAX) 250 MG tablet Take 1 tablet by mouth once a day 01/16/21  Yes Byrum, Rose Fillers, MD  bisacodyl (DULCOLAX) 10 MG suppository Place 1 suppository (10 mg total) rectally daily as needed for severe constipation. 07/10/20  Yes Etha Stambaugh, Ines Bloomer, MD  budesonide (PULMICORT) 0.25 MG/2ML nebulizer solution Take 2 mLs (0.25 mg total) by nebulization 2 (two) times daily. 01/25/21  Yes Collene Gobble, MD  Chlorhexidine Gluconate Cloth 2 % PADS Apply 6 each topically daily. 08/21/20  Yes Elliott Lasecki, Ines Bloomer, MD  clonazePAM (KLONOPIN) 1 MG tablet PLACE 1/2 TABLET INTO FEEDING TUBE THREE TIMES DAILY AS NEEDED FOR  ANXIETY Patient taking differently: Place 0.5 mg into feeding tube 3 (three) times daily as needed for anxiety. 12/06/20  Yes Veronica Guerrant, Ines Bloomer, MD  clotrimazole-betamethasone (LOTRISONE) cream Apply 1 application topically 2 (two) times daily. 01/10/21  Yes Daleysa Kristiansen, Ines Bloomer, MD  doxycycline (MONODOX) 100 MG capsule Take 1 capsule (100 mg total) by mouth 2 (two) times daily. Patient taking differently: Place 100 mg into feeding tube 2 (two) times daily. 11/15/20  Yes Burns, Claudina Lick, MD  ELIQUIS 5 MG TABS tablet PLACE 1 TABLET INTO FEEDING TUBE TWICE DAILY 12/17/20  Yes Horald Pollen, MD  hydrOXYzine (ATARAX/VISTARIL) 25 MG tablet Take 1 tablet (25 mg total) by mouth at bedtime as needed. 01/01/21  Yes Horald Pollen, MD  levothyroxine (SYNTHROID) 88 MCG tablet TAKE 1 TABLET(88 MCG) BY MOUTH DAILY AT 6 AM 01/13/21  Yes Hattie Pine, Ines Bloomer, MD  lip balm (CARMEX) ointment Apply topically as needed for lip care. 03/08/20  Yes Candee Furbish, MD  metoCLOPramide (REGLAN) 10 MG/10ML SOLN PLAVE 10 MLS INTO FEEDING TUBE EVERY MORNING AND AT BEDTIME 12/25/20  Yes Bowie Delia, Ines Bloomer, MD  Nutritional Supplements (ENSURE ACTIVE HIGH PROTEIN) LIQD Take 1 Can by mouth in the morning and at bedtime. 07/09/20  Yes Barb Merino, MD  Nutritional Supplements (FEEDING SUPPLEMENT, OSMOLITE 1.2 CAL,) LIQD Place 1,000 mLs into feeding tube continuous. 07/10/20  Yes Mannix Kroeker, Ines Bloomer, MD  pantoprazole (PROTONIX) 40 MG tablet TAKE 1 TABLET(40 MG) BY MOUTH DAILY Patient taking differently: 40 mg daily. Per tube 11/19/20  Yes Akeiba Axelson, Ines Bloomer, MD  phenol (CHLORASEPTIC) 1.4 % LIQD Use as directed 1 spray in the mouth or throat as needed for throat irritation / pain. 03/08/20  Yes Candee Furbish, MD  polyethylene glycol (MIRALAX / GLYCOLAX) 17 g packet Place 17 g into feeding tube daily as needed for mild constipation. 03/08/20  Yes Candee Furbish, MD  revefenacin [facility]) 175 MCG/3ML nebulizer  solution Take 3 mLs (175 mcg total) by nebulization daily. 12/25/20  Yes [place], Ines Bloomer, MD  rosuvastatin (CRESTOR) 20 MG tablet TAKE 1 TABLET(20 MG) BY MOUTH DAILY Patient taking differently: Place 20 mg into feeding tube every evening. 11/14/20  Yes Benson Porcaro, Ines Bloomer, MD  senna-docusate (SENOKOT-S) 8.6-50 MG tablet Place 2 tablets into feeding tube at bedtime as needed for moderate constipation. 11/27/20  Yes McDonald, Mia A, PA-C  simethicone (MYLICON) 40 PY/1.9JK drops Place 0.6 mLs (40 mg total) into feeding tube every 6 (six) hours. 09/18/20  Yes Mercy Riding, MD  Simethicone 125 MG CAPS Give 125 mg by tube daily.   Yes [provider]  sodium chloride (OCEAN) 0.65 % SOLN nasal spray Place 1 spray into both nostrils as needed for congestion. 03/08/20  Yes Candee Furbish, MD  sorbitol 70 % SOLN Place 30 mLs into feeding tube daily as needed for moderate constipation. 07/25/20  Yes Candee Furbish, MD  temazepam (RESTORIL) 15 MG capsule Place 1 capsule (15 mg total) into feeding tube at bedtime as needed for sleep. 10/29/20  Yes Horald Pollen, MD  venlafaxine (EFFEXOR) 37.5 MG tablet PLACE 1 TABLET INTO FEEDING TUBE 2 TIMES A DAY WITH A MEAL 01/13/21  Yes Lavine Hargrove, Ines Bloomer, MD  chlorhexidine gluconate, MEDLINE KIT, (PERIDEX) 0.12 % solution 15 mLs by Mouth Rinse route 2 (two) times daily. 01/29/21   Horald Pollen, MD  ipratropium-albuterol (DUONEB) 0.5-2.5 (3) MG/3ML SOLN INAHEL CONTENTS OF 1 VIAL PER NEBULIZER EVER 4 HOURS Patient taking differently: Take 3 mLs by nebulization every 4 (four) hours as needed (sob/wheezing). 10/18/20 01/16/21  Collene Gobble, MD  traMADol (ULTRAM) 50 MG tablet TAKE 1 TABLET(50 MG) BY MOUTH THREE TIMES DAILY AS NEEDED 01/29/21   Horald Pollen, MD    Allergies  Allergen Reactions   Stiolto Respimat [Tiotropium Bromide-Olodaterol] Other (See Comments)    Severe headaches and rapid heartrate    Patient Active Problem List    Diagnosis Date Noted   Intractable nausea and vomiting    Severe sepsis with septic shock (CODE) ([place]) 08/29/2020   Aspiration pneumonia ([place])    Goals of care, counseling/discussion    Ileus ([place])    Tracheostomy dependence ([place])    Sepsis ([place]) 06/03/2020   Healthcare-associated pneumonia 06/03/2020   Pressure injury of skin 06/03/2020   Acute respiratory failure (HCC)    Seizure disorder (HCC)    COPD, severe (HCC)    Anxiety disorder    Generalized abdominal pain    Convulsions (HCC)    Malnutrition of moderate degree 02/27/2020   Hypotension    Acute hypercapnic respiratory failure ([place]) 02/25/2020   Right upper quadrant abdominal pain 02/01/2020   Hypothyroidism (acquired) 93/26/7124   Acute metabolic encephalopathy 58/12/9831   Hyponatremia 01/28/2020   Anxiety 12/02/2019   COPD exacerbation ([place]) 11/06/2019   Depression 11/06/2019   Acute on chronic respiratory failure with hypoxia and hypercapnia (HCC) 11/06/2019   Current moderate episode of major depressive disorder without prior episode ([place]) 09/20/2019   Pedal edema 08/04/2019   Hypokalemia 04/13/2019   Chronic respiratory  failure with hypoxia ([place]) 03/07/2019   CKD (chronic kidney disease) 01/05/2019   COPD (chronic obstructive pulmonary disease) ([place]) 09/29/2018   Pulmonary nodules 09/27/2018   Dyslipidemia 07/07/2018   Essential hypertension 03/10/2018    Past Medical History:  Diagnosis Date   Acute on chronic respiratory failure with hypoxia (HCC)    Anxiety disorder    Asthma    COPD (chronic obstructive pulmonary disease) (HCC)    COPD, severe ([place])    Hypertension    Seizure disorder [facility])     Past Surgical History:  Procedure Laterality Date   COLONOSCOPY N/A 06/26/2020   Procedure: COLONOSCOPY;  Surgeon: Carol Ada, MD;  Location: [place];  Service: Endoscopy;  Laterality: N/A;   ENTEROSCOPY N/A 06/26/2020   Procedure: ENTEROSCOPY;  Surgeon: Carol Ada, MD;  Location: [place];  Service: Endoscopy;  Laterality: N/A;   HEMOSTASIS CLIP PLACEMENT  06/26/2020   Procedure: HEMOSTASIS CLIP PLACEMENT;  Surgeon: Carol Ada, MD;  Location: [place];  Service: Endoscopy;;   HOT HEMOSTASIS N/A 06/26/2020   Procedure: HOT HEMOSTASIS (ARGON PLASMA COAGULATION/BICAP);  Surgeon: Carol Ada, MD;  Location: [place];  Service: Endoscopy;  Laterality: N/A;   IR FLUORO RM 30-60 MIN  03/23/2020   IR REPLC GASTRO/COLONIC TUBE PERCUT W/FLUORO  09/12/2020   IR REPLC GASTRO/COLONIC TUBE PERCUT W/FLUORO  12/26/2020   LAPAROSCOPIC INSERTION GASTROSTOMY TUBE N/A 04/24/2020   Procedure: LAPAROSCOPIC GASTROSTOMY TUBE PLACEMENT;  Surgeon: Mickeal Skinner, MD;  Location: [place];  Service: General;  Laterality: N/A;   POLYPECTOMY  06/26/2020   Procedure: POLYPECTOMY;  Surgeon: Carol Ada, MD;  Location: [facility] ENDOSCOPY;  Service: Endoscopy;;   WISDOM TOOTH EXTRACTION      Social History   Socioeconomic History   Marital status: Married    Spouse name: Not on file   Number of children: Not on file   Years of education: Not on file   Highest education level: Not on file  Occupational History   Not on file  Tobacco Use   Smoking status: Never   Smokeless tobacco: Never  Vaping Use   Vaping Use: Never used  Substance and Sexual Activity   Alcohol use: Yes    Alcohol/week: 2.0 standard drinks    Types: 2 Cans of beer per week   Drug use: Never   Sexual activity: Not on file  Other Topics Concern   Not on file  Social History Narrative   Not on file   Social Determinants of Health   Financial Resource Strain: Not on file  Food Insecurity: No Food Insecurity   Worried About Running Out of Food in the Last Year: Never true   Ran Out of Food in the Last Year: Never true  Transportation Needs: No Transportation Needs   Lack of Transportation (Medical): No   Lack of Transportation (Non-Medical): No  Physical Activity: Not on file  Stress: Not on file  Social  Connections: Not on file  Intimate Partner Violence: Not on file    Family History  Family history unknown: Yes     Review of Systems  Constitutional: Negative.  Negative for fever.  HENT: Negative.  Negative for congestion and sore throat.   Respiratory:  Negative for cough and shortness of breath.   Cardiovascular:  Negative for chest pain.  Gastrointestinal:  Negative for diarrhea and vomiting.  Genitourinary: Negative.   Skin: Negative.   Neurological:  Negative for dizziness and headaches.  All other systems reviewed and are negative.  Today's Vitals   01/29/21 1054  BP: (!) 146/70  Pulse: 76  SpO2: 96%  Weight: 117 lb (53.1 kg)  Height: _0  (1.651 m)   Body mass index is 19.47 kg/m.  Physical Exam Vitals reviewed.  HENT:     Head: Normocephalic.  Eyes:     Extraocular Movements: Extraocular movements intact.     Pupils: Pupils are equal, round, and reactive to light.  Neck:     Comments: Tracheostomy tube in place Cardiovascular:     Rate and Rhythm: Normal rate and regular rhythm.     Pulses: Normal pulses.     Heart sounds: Normal heart sounds.  Pulmonary:     Effort: Pulmonary effort is normal.     Breath sounds: Normal breath sounds.  Abdominal:     General: There is no distension.     Tenderness: There is no abdominal tenderness.     Comments: Feeding tube in place.  No signs of infection.  Skin:    General: Skin is warm and dry.     Capillary Refill: Capillary refill takes less than 2 seconds.     Comments: No decubitus ulcers noted  Neurological:     Mental Status: She is alert. Mental status is at baseline.     Comments: Awake and alert.  Responds to verbal stimuli.     ASSESSMENT & PLAN: Clinically stable.  No red flag signs or symptoms.  No medical concerns identified during this visit. Tracheostomy tube and feeding tube working well.  No concerns. No decubitus ulcers. Continue present medications.  Use tramadol only as needed for  severe pain. Medication list reviewed with nurse.  No changes. Well-controlled hypertension.  Problem List Items Addressed This Visit       Cardiovascular and Mediastinum   Essential hypertension (Chronic)     Respiratory   Chronic respiratory failure with hypoxia (HCC) - Primary (Chronic)   COPD, severe ([place])   Other Visit Diagnoses     Tracheostomy status ([place])          Patient Instructions  Hypertension, Adult High blood pressure (hypertension) is when the force of blood pumping through the arteries is too strong. The arteries are the blood vessels that carry blood from the heart throughout the body. Hypertension forces the heart to work harder to pump blood and may cause arteries to become narrow or stiff. Untreated or uncontrolled hypertension can cause a heart attack, heart failure, a stroke, kidney disease, and other problems. A blood pressure reading consists of a higher number over a lower number. Ideally, your blood pressure should be below 120/80. The first ("top") number is called the systolic pressure. It is a measure of the pressure in your arteries as your heart beats. The second ("bottom") number is called the diastolic pressure. It is a measure of the pressure in your arteries as the heart relaxes. What are the causes? The exact cause of this condition is not known. There are some conditions that result in or are related to high blood pressure. What increases the risk? Some risk factors for high blood pressure are under your control. The following factors may make you more likely to develop this condition: Smoking. Having type 2 diabetes mellitus, high cholesterol, or both. Not getting enough exercise or physical activity. Being overweight. Having too much fat, sugar, calories, or salt (sodium) in your diet. Drinking too much alcohol. Some risk factors for high blood pressure may be difficult or impossible to change. Some of these  factors include: Having chronic  kidney disease. Having a family history of high blood pressure. Age. Risk increases with age. Race. You may be at higher risk if you are African American. Gender. Men are at higher risk than women before age 52. After age 60, women are at higher risk than men. Having obstructive sleep apnea. Stress. What are the signs or symptoms? High blood pressure may not cause symptoms. Very high blood pressure (hypertensive crisis) may cause: Headache. Anxiety. Shortness of breath. Nosebleed. Nausea and vomiting. Vision changes. Severe chest pain. Seizures. How is this diagnosed? This condition is diagnosed by measuring your blood pressure while you are seated, with your arm resting on a flat surface, your legs uncrossed, and your feet flat on the floor. The cuff of the blood pressure monitor will be placed directly against the skin of your upper arm at the level of your heart. It should be measured at least twice using the same arm. Certain conditions can cause a difference in blood pressure between your right and left arms. Certain factors can cause blood pressure readings to be lower or higher than normal for a short period of time: When your blood pressure is higher when you are in a health care provider's office than when you are at home, this is called white coat hypertension. Most people with this condition do not need medicines. When your blood pressure is higher at home than when you are in a health care provider's office, this is called masked hypertension. Most people with this condition may need medicines to control blood pressure. If you have a high blood pressure reading during one visit or you have normal blood pressure with other risk factors, you may be asked to: Return on a different day to have your blood pressure checked again. Monitor your blood pressure at home for 1 week or longer. If you are diagnosed with hypertension, you may have other blood or imaging tests to help your  health care provider understand your overall risk for other conditions. How is this treated? This condition is treated by making healthy lifestyle changes, such as eating healthy foods, exercising more, and reducing your alcohol intake. Your health care provider may prescribe medicine if lifestyle changes are not enough to get your blood pressure under control, and if: Your systolic blood pressure is above 130. Your diastolic blood pressure is above 80. Your personal target blood pressure may vary depending on your medical conditions, your age, and other factors. Follow these instructions at home: Eating and drinking  Eat a diet that is high in fiber and potassium, and low in sodium, added sugar, and fat. An example eating plan is called the DASH (Dietary Approaches to Stop Hypertension) diet. To eat this way: Eat plenty of fresh fruits and vegetables. Try to fill one half of your plate at each meal with fruits and vegetables. Eat whole grains, such as whole-wheat pasta, brown rice, or whole-grain bread. Fill about one fourth of your plate with whole grains. Eat or drink low-fat dairy products, such as skim milk or low-fat yogurt. Avoid fatty cuts of meat, processed or cured meats, and poultry with skin. Fill about one fourth of your plate with lean proteins, such as fish, chicken without skin, beans, eggs, or tofu. Avoid pre-made and processed foods. These tend to be higher in sodium, added sugar, and fat. Reduce your daily sodium intake. Most people with hypertension should eat less than 1,500 mg of sodium a day. Do not drink alcohol if:  Your health care provider tells you not to drink. You are pregnant, may be pregnant, or are planning to become pregnant. If you drink alcohol: Limit how much you use to: 0-1 drink a day for women. 0-2 drinks a day for men. Be aware of how much alcohol is in your drink. In the [place] one drink equals one 12 oz bottle of beer (355 mL), one 5 oz glass of wine  (148 mL), or one 1 oz glass of hard liquor (44 mL). Lifestyle  Work with your health care provider to maintain a healthy body weight or to lose weight. Ask what an ideal weight is for you. Get at least 30 minutes of exercise most days of the week. Activities may include walking, swimming, or biking. Include exercise to strengthen your muscles (resistance exercise), such as Pilates or lifting weights, as part of your weekly exercise routine. Try to do these types of exercises for 30 minutes at least 3 days a week. Do not use any products that contain nicotine or tobacco, such as cigarettes, e-cigarettes, and chewing tobacco. If you need help quitting, ask your health care provider. Monitor your blood pressure at home as told by your health care provider. Keep all follow-up visits as told by your health care provider. This is important. Medicines Take over-the-counter and prescription medicines only as told by your health care provider. Follow directions carefully. Blood pressure medicines must be taken as prescribed. Do not skip doses of blood pressure medicine. Doing this puts you at risk for problems and can make the medicine less effective. Ask your health care provider about side effects or reactions to medicines that you should watch for. Contact a health care provider if you: Think you are having a reaction to a medicine you are taking. Have headaches that keep coming back (recurring). Feel dizzy. Have swelling in your ankles. Have trouble with your vision. Get help right away if you: Develop a severe headache or confusion. Have unusual weakness or numbness. Feel faint. Have severe pain in your chest or abdomen. Vomit repeatedly. Have trouble breathing. Summary Hypertension is when the force of blood pumping through your arteries is too strong. If this condition is not controlled, it may put you at risk for serious complications. Your personal target blood pressure may vary  depending on your medical conditions, your age, and other factors. For most people, a normal blood pressure is less than 120/80. Hypertension is treated with lifestyle changes, medicines, or a combination of both. Lifestyle changes include losing weight, eating a healthy, low-sodium diet, exercising more, and limiting alcohol. This information is not intended to replace advice given to you by your health care provider. Make sure you discuss any questions you have with your health care provider. Document Revised: 12/16/2017 Document Reviewed: 12/16/2017 Elsevier Patient Education  2022 [place], [place] [facility]

## 2021-01-30 NOTE — Telephone Encounter (Signed)
Please place orders for all of these.  Thanks.

## 2021-01-31 NOTE — Telephone Encounter (Signed)
Placed a DME order for chuck pads, 4x4 gauze, and medipore H tape.

## 2021-02-05 ENCOUNTER — Other Ambulatory Visit: Payer: Self-pay | Admitting: Internal Medicine

## 2021-02-05 ENCOUNTER — Telehealth: Payer: Self-pay | Admitting: Emergency Medicine

## 2021-02-05 NOTE — Telephone Encounter (Signed)
Spoke with Kathleen Cameron about this  She is going to fax CMN  Will hold until we get this and have RB sign

## 2021-02-05 NOTE — Telephone Encounter (Signed)
Susie from [facility] called in  Wants to know the name of the DME company order was placed with  Please call [phone]

## 2021-02-06 NOTE — Telephone Encounter (Signed)
Called and talked to [facility], she states pt had not received supplies. Sent a community message to inquire about the order and get an update.

## 2021-02-07 ENCOUNTER — Telehealth: Payer: Self-pay | Admitting: Emergency Medicine

## 2021-02-07 MED ORDER — CHLORHEXIDINE GLUCONATE 0.12% ORAL RINSE (MEDLINE KIT): 15.0000 mL | Freq: Two times a day (BID) | OROMUCOSAL | 2 refills | Status: AC

## 2021-02-07 NOTE — Telephone Encounter (Signed)
Patient calling to inform rx chlorhexidine gluconate, MEDLINE KIT, (PERIDEX) 0.12 % solution was not sent to pharmacy  Pharmacy [id number] - [place], [place] - [address] (Ph: [phone]) 

## 2021-02-07 NOTE — Telephone Encounter (Signed)
Pt husband called stating that they did not receive a refill of  chlorhexidine gluconate, MEDLINE KIT, (PERIDEX) 0.12 % solution that was refilled 01/29/21. Called the pharmacy to verify that medication was not sent and they confirmed. Faxed over prescription due to problems with e-scribe.

## 2021-02-10 ENCOUNTER — Other Ambulatory Visit: Payer: Self-pay | Admitting: Emergency Medicine

## 2021-02-11 ENCOUNTER — Other Ambulatory Visit: Payer: Self-pay | Admitting: Emergency Medicine

## 2021-02-12 ENCOUNTER — Telehealth: Payer: Self-pay | Admitting: Emergency Medicine

## 2021-02-12 ENCOUNTER — Other Ambulatory Visit: Payer: Self-pay | Admitting: Emergency Medicine

## 2021-02-12 NOTE — Telephone Encounter (Signed)
Susie from [facility] calling in regarding patient g-tube  Patient says is is very uncomfortable & painful when moving around  Wondering if patient still needs g-tube.. currently only using for meds..shes able to swallow small pills, drink & eat regularly..mainly used for larger pills  Please call [phone]

## 2021-02-12 NOTE — Telephone Encounter (Signed)
I have not seen anything on her from [place] lots of times those types dont come to me. Might be in his look at

## 2021-02-12 NOTE — Telephone Encounter (Signed)
Johny Drilling, please advise if CMN has been received and if this has been taken care of yet by RB.

## 2021-02-13 ENCOUNTER — Telehealth: Payer: Self-pay | Admitting: Emergency Medicine

## 2021-02-13 DIAGNOSIS — Z930 Tracheostomy status: Secondary | ICD-10-CM

## 2021-02-13 NOTE — Telephone Encounter (Signed)
Called and spoke with Susie from home health, she states that she will contact pt pulmonary provider for information on pt g tube.

## 2021-02-13 NOTE — Telephone Encounter (Signed)
Who placed this G-tube?  Needs to be reevaluated by that particular specialist.  Thanks.

## 2021-02-13 NOTE — Telephone Encounter (Signed)
Spoke with Susie with [name] who is requesting an order for a 8 scp single canula trach. Susie was informed we do not manage pt's G tube. Dr. [name] could we please have this trach order?

## 2021-02-14 ENCOUNTER — Telehealth: Payer: Self-pay | Admitting: Emergency Medicine

## 2021-02-14 NOTE — Telephone Encounter (Signed)
OK to order the trach as requested.  She will need to be seen here or in trach clinic before we can determine whether to d/c the PEG. Will also need to discuss this with her PCP to insure no objections to doing so.

## 2021-02-14 NOTE — Telephone Encounter (Signed)
Tish from [employer] Calling in on behalf of patient  Says they faxed over a DME Order & wanted to know if we had received it  Please call to confirm [phone] (ext [phone])

## 2021-02-15 ENCOUNTER — Telehealth: Payer: Self-pay

## 2021-02-15 ENCOUNTER — Other Ambulatory Visit: Payer: Self-pay | Admitting: Emergency Medicine

## 2021-02-15 DIAGNOSIS — R1010 Upper abdominal pain, unspecified: Secondary | ICD-10-CM

## 2021-02-15 DIAGNOSIS — Z930 Tracheostomy status: Secondary | ICD-10-CM

## 2021-02-15 NOTE — Telephone Encounter (Signed)
Patient's nurse Susie calling to check status of fax  Fax [phone]  Local fax [phone]

## 2021-02-15 NOTE — Telephone Encounter (Signed)
Called and spoke with Susie from home health to advise her that the fax had not come through the cue yet. Will update her when I receive fax.Also, susie wanted to know who placed pt g-tube, so they can get it removed. Will help find out who placed tube.

## 2021-02-15 NOTE — Telephone Encounter (Signed)
DME order has been placed. Nothing further needed at this time.

## 2021-02-15 NOTE — Telephone Encounter (Signed)
Attempted to call Susie with [facility] but unable to reach. Left message for her to return call. 

## 2021-02-15 NOTE — Telephone Encounter (Signed)
Will need to call Kathleen Cameron on Monday since it is after 5pm.

## 2021-02-15 NOTE — Telephone Encounter (Signed)
-----   Message from Simonne Martinet, NP sent at 02/13/2021 12:39 PM EDT ----- Regarding: DME needs   Hey guys need to order this trach from her DME please  Trach Brand: Shiley Size: 8.0  SCT Style: Cuffed  (The SCT part is important)   Thanks [address]

## 2021-02-19 ENCOUNTER — Telehealth: Payer: Self-pay | Admitting: Emergency Medicine

## 2021-02-19 DIAGNOSIS — Z931 Gastrostomy status: Secondary | ICD-10-CM

## 2021-02-19 DIAGNOSIS — T85528A Displacement of other gastrointestinal prosthetic devices, implants and grafts, initial encounter: Secondary | ICD-10-CM

## 2021-02-19 NOTE — Telephone Encounter (Signed)
I spoke with [name] and notified of response per Dr Alessandra Bevels order to [employer] was placed  Nothing further needed

## 2021-02-19 NOTE — Telephone Encounter (Signed)
Called Susie and had to [facility]

## 2021-02-19 NOTE — Telephone Encounter (Signed)
Kathleen Cameron is returning phone call. Kathleen Cameron phone number is [phone].

## 2021-02-19 NOTE — Telephone Encounter (Signed)
Susie from [place] has called to get an update on fax that needed to be signed by provider for pt. To receive incontinence pads. States that it was sent over last month, but she hasn'e received anything.    Please advise.    Callback #- [phone]  Company #- [phone]

## 2021-02-19 NOTE — Telephone Encounter (Signed)
Beverly from [place] calling in  [place] she received pages 1-3 of fax for signed orders on plan of care but did not get pages 4 & 5  Please resend pages 4-5  Callback # [phone]

## 2021-02-21 ENCOUNTER — Other Ambulatory Visit: Payer: Self-pay | Admitting: Emergency Medicine

## 2021-02-21 NOTE — Telephone Encounter (Signed)
Received faxed from [employer] for gauze, non-waterproof tape, and under pads. Called and spoke with Susie to updated her. Order signed and faxed back.

## 2021-02-21 NOTE — Telephone Encounter (Signed)
Re faxed orders to [facility].

## 2021-02-27 NOTE — Telephone Encounter (Signed)
Called and spoke with Susie from [facility], informed her that the form from [employer] was received via fax. Will have provider fill form out and fax back.

## 2021-02-27 NOTE — Telephone Encounter (Signed)
Susie from [place] has called again and states that [employer] states there is missing information. Information needed is [place]PA Form, and diagnosis. States that [employer] attempted to cal yesterday, after hours. Form faxes yesterday afternoon. Did not give specific time.    Please advise.    Callback #- [phone] Lynnell Dike)

## 2021-03-05 ENCOUNTER — Telehealth: Payer: Self-pay | Admitting: Emergency Medicine

## 2021-03-05 NOTE — Telephone Encounter (Signed)
Susie from [place] calling in  Says she spoke w/ rep from [employer] & they advised that they still need the correct diagnosis code for patient.. says diagnosis code needs to indicated why patient is not able to get out of bed.. says they are still waiting on this so they can send chuck pads for patient  Callback [phone]

## 2021-03-06 NOTE — Telephone Encounter (Signed)
Susie from [place] called back to follow up on the G-tube order. Requesting a callback for a update.   Best contact #: [phone]

## 2021-03-07 NOTE — Telephone Encounter (Signed)
Last one I got from [employer] was 01/30/21 it was faxed back on 01/31/21

## 2021-03-07 NOTE — Telephone Encounter (Signed)
Johny Drilling, this CMN is from [employer]. Reaching back out to see if you have seen it yet? If not, we will call [employer] to get it re-faxed.

## 2021-03-08 ENCOUNTER — Other Ambulatory Visit: Payer: Self-pay | Admitting: Emergency Medicine

## 2021-03-08 NOTE — Telephone Encounter (Signed)
Refill requested rec'd from pharmacy, is patient suppose to continue on medication daily,   Dr. [name] Please advise and if you want refills

## 2021-03-08 NOTE — Telephone Encounter (Signed)
Called and spoke with Susie from home health to discuss g-tube removal and diagnosis code needed for [employer] for pt to receive under pads. Placed a referral to general surgery for pt to have g-tube removed. Also, faxed back [employer] form with diagnosis code N18.9 ( chronic kidney disease).

## 2021-03-08 NOTE — Telephone Encounter (Signed)
Called and spoke with Susie, updated her on g-tube and [employer] order.

## 2021-03-12 ENCOUNTER — Telehealth: Payer: Self-pay | Admitting: Emergency Medicine

## 2021-03-12 MED ORDER — AZITHROMYCIN 250 MG PO TABS: ORAL_TABLET | ORAL | 1 refills | Status: DC

## 2021-03-12 NOTE — Telephone Encounter (Signed)
Rx for pt's azithromycin has been sent to preferred pharmacy for pt. Called and spoke with Susie letting her know this had been done for pt and she verbalized understanding stating that she would let pt know. Nothing further needed.

## 2021-03-13 ENCOUNTER — Other Ambulatory Visit (HOSPITAL_COMMUNITY): Payer: Self-pay

## 2021-03-13 ENCOUNTER — Other Ambulatory Visit: Payer: Self-pay | Admitting: Internal Medicine

## 2021-03-13 ENCOUNTER — Other Ambulatory Visit: Payer: Self-pay | Admitting: Emergency Medicine

## 2021-03-13 ENCOUNTER — Telehealth: Payer: Self-pay | Admitting: Pharmacy Technician

## 2021-03-13 DIAGNOSIS — E039 Hypothyroidism, unspecified: Secondary | ICD-10-CM

## 2021-03-13 NOTE — Telephone Encounter (Signed)
Patient Advocate Encounter  Received notification from [employer] that prior authorization for YUPELRI NEB SOL is required.   PA submitted on 11.23.22 SUBMITTED ON NCTRACKS:  [id number]  Status is pending   [facility] will continue to follow  Ricke Hey, CPhT Patient Advocate Phone: [phone] Fax:  [phone]

## 2021-03-13 NOTE — Telephone Encounter (Signed)
Received notification from [employer] regarding a prior authorization for YUPELRI NEB SOL. Authorization has been APPROVED from 11.23.22 to 11.23.23.    Authorization # [id number]

## 2021-03-15 ENCOUNTER — Other Ambulatory Visit: Payer: Self-pay | Admitting: Emergency Medicine

## 2021-03-15 DIAGNOSIS — E039 Hypothyroidism, unspecified: Secondary | ICD-10-CM

## 2021-03-25 ENCOUNTER — Other Ambulatory Visit: Payer: Self-pay | Admitting: Internal Medicine

## 2021-03-27 ENCOUNTER — Telehealth: Payer: Self-pay

## 2021-03-27 NOTE — Telephone Encounter (Signed)
I was able to inform Kathleen Cameron of Dr. Latrelle Dodrill verbal ok for pt to go forth with the G-Tube removal.

## 2021-03-27 NOTE — Telephone Encounter (Signed)
Pts caregiver is asking if the order forms have been received for pts chuck pads as they are needed and [employer] has faxed over.  Please call [employer] [phone] with any questions or concerns.

## 2021-03-27 NOTE — Telephone Encounter (Signed)
Please advise as Susie from [place] has called and has informed me that they found the provider who placed pts G-Tube. That office has stated they need an OK from pts PCP to go forth with setting up procedure to remove pts G-Tube.  Please call Susie [phone].

## 2021-03-27 NOTE — Telephone Encounter (Signed)
It is okay to go forward and remove patient's G-tube.

## 2021-03-28 ENCOUNTER — Ambulatory Visit (HOSPITAL_COMMUNITY)
Admission: RE | Admit: 2021-03-28 | Discharge: 2021-03-28 | Disposition: A | Discharge status: Home / Self Care | Payer: Medicare Other | Source: Ambulatory Visit | Attending: Acute Care | Admitting: Acute Care

## 2021-03-28 ENCOUNTER — Other Ambulatory Visit: Payer: Self-pay

## 2021-03-28 DIAGNOSIS — J449 Chronic obstructive pulmonary disease, unspecified: Secondary | ICD-10-CM | POA: Insufficient documentation

## 2021-03-28 DIAGNOSIS — Z9911 Dependence on respirator [ventilator] status: Secondary | ICD-10-CM | POA: Insufficient documentation

## 2021-03-28 DIAGNOSIS — Z4689 Encounter for fitting and adjustment of other specified devices: Secondary | ICD-10-CM | POA: Diagnosis not present

## 2021-03-28 DIAGNOSIS — R531 Weakness: Secondary | ICD-10-CM | POA: Diagnosis not present

## 2021-03-28 DIAGNOSIS — J9610 Chronic respiratory failure, unspecified whether with hypoxia or hypercapnia: Secondary | ICD-10-CM | POA: Diagnosis not present

## 2021-03-28 DIAGNOSIS — Z430 Encounter for attention to tracheostomy: Secondary | ICD-10-CM | POA: Diagnosis not present

## 2021-03-28 DIAGNOSIS — Z4682 Encounter for fitting and adjustment of non-vascular catheter: Secondary | ICD-10-CM | POA: Diagnosis present

## 2021-03-28 DIAGNOSIS — Z7401 Bed confinement status: Secondary | ICD-10-CM | POA: Diagnosis not present

## 2021-03-28 NOTE — Progress Notes (Signed)
Tracheostomy Procedure Note  Kathleen Cameron [id number] 28-Aug-1965  Pre Procedure Tracheostomy Information  Trach Brand: Shiley Size:  8SCT Style: Cuffed Secured by: Velcro   Procedure: Trach Change and Trach Cleaning    Post Procedure Tracheostomy Information  Trach Brand: Shiley Size: 8SCT Style: Cuffed Secured by: Velcro   Post Procedure Evaluation:  ETCO2 positive color change from yellow to purple : Yes.   Vital signs:VSS Patients current condition: stable Complications: No apparent complications Trach site exam: clean, dry Wound care done: 4 x 4 gauze drain gauze Patient did tolerate procedure well.   Education: none  Prescription needs: none    Additional needs: Extra trach for home  8SCT given to Home Health Nurse

## 2021-03-28 NOTE — Progress Notes (Signed)
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 She lives at home w/ family. Has Nursing care and care provided by mix of staff and family. She is vent dependent. I follow her for trach management. Presents today for planned trach change. No new issues from trach standpoint.   ROS Gen: no fever, chills, sick exposure.HENT no HA, nasal congestion, sore throat, cough, trach drainage. Only needing suction about 3x/wk. Pulm no new SOB no inc wheezing. Actually trialing PMV (in-line at home and doing well). Card No CP, SOB, palps. Abd: no NVD GU no freq/urg/hes. MS no pain or weakness. Neuro No memory loss change in cognition of focal weakness. Endo no hot/cold intol. No wt gain or loss.   Exam  General 55 year old female resting on home vent NAD HENT NCAT no JVD. MMM 8 SCT shiley in place.  Pulm cl/decreased bases Card rrr Abd soft  Neuro awake and oriented Ext warm and dry   Procedure  The 8SCT was removed. The pt had been pre-medicated w/ inhaled lidocaine. After localization completed the new 8SCT shiley cuffed was placed It was fairly difficult to place.  Post-changed dis have some bloody secretions. We administered post trach changed lidocaine as well. Pt tolerated well   Impression  Trac dependence  COPD Vent dependence Chronic respiratory failure   Plan  Cont routine trach change Cont vent support ROV 8 weeks for trach change Ordering 6 SCT trach for backup. If her's were to become dislodged doubt coukd get 8 back in   My time 43 min  Simonne Martinet ACNP-BC [facility] Pulmonary/Critical Care Pager # [phone] OR # [phone] if no answer

## 2021-03-29 ENCOUNTER — Telehealth: Payer: Self-pay | Admitting: Emergency Medicine

## 2021-03-29 ENCOUNTER — Other Ambulatory Visit: Payer: Self-pay | Admitting: Emergency Medicine

## 2021-03-29 DIAGNOSIS — E440 Moderate protein-calorie malnutrition: Secondary | ICD-10-CM | POA: Diagnosis not present

## 2021-03-29 DIAGNOSIS — J449 Chronic obstructive pulmonary disease, unspecified: Secondary | ICD-10-CM | POA: Diagnosis not present

## 2021-03-29 MED ORDER — YUPELRI 175 MCG/3ML IN SOLN: RESPIRATORY_TRACT | 3 refills | Status: AC

## 2021-03-29 NOTE — Telephone Encounter (Signed)
I spoke with the pt's spouse  Pt needing refill on Yupelri  I have sent this in  Nothing further needed

## 2021-03-29 NOTE — Telephone Encounter (Signed)
Susie w/ [name] requesting a call back to discuss next steps for patient's g-tube removal  Susie also checking status of under pad prescription  Phone [phone]

## 2021-04-01 ENCOUNTER — Telehealth: Payer: Self-pay | Admitting: Emergency Medicine

## 2021-04-01 DIAGNOSIS — Z930 Tracheostomy status: Secondary | ICD-10-CM | POA: Diagnosis not present

## 2021-04-01 NOTE — Telephone Encounter (Signed)
Call returned to [facility]. I asked that she re-fax. Confirmed fax number.    Nothing further needed at this time.

## 2021-04-02 ENCOUNTER — Telehealth: Payer: Self-pay | Admitting: Emergency Medicine

## 2021-04-02 NOTE — Telephone Encounter (Signed)
Called and spoke with Susie from [place] who states pt has medicaid and that the [place] was going to be over $200. Per Lynnell Dike, pt's spouse had stated after speaking with pt's case worker that pt should not have to pay anything for the [facility] but then was told that it was going to cost them over $200.  I stated to Susie that pt's spouse needed to contact insurance to further get this discussed and also stated to her that we were going to route this to Dr. [name] as well for any advice and she verbalized understanding.

## 2021-04-02 NOTE — Telephone Encounter (Signed)
Kathleen Cameron has called to follow up about g-tube removal and message put in on 12.9.22.     Callback #- [phone]

## 2021-04-03 NOTE — Telephone Encounter (Signed)
Dr [name], it looks like pt does not have medicare, but she has medicaid. If you want I can try sending her rx to [facility] and see if she can get a better price there. Thanks.

## 2021-04-03 NOTE — Telephone Encounter (Signed)
Patient is requesting a refill of the following medications: Requested Prescriptions   Pending Prescriptions Disp Refills   traMADol (ULTRAM) 50 MG tablet 30 tablet 1    Sig: TAKE 1 TABLET(50 MG) BY MOUTH THREE TIMES DAILY AS NEEDED    Date of patient request: 04/03/21 Last office visit: 01/29/21 Date of last refill: 01/29/21 Last refill amount: 30,1 Follow up time period per chart: n/a

## 2021-04-03 NOTE — Telephone Encounter (Signed)
Before we change the medication, we need to insure that it was ordered correctly through her DME, medicare B vs D. Thank you

## 2021-04-04 DIAGNOSIS — J449 Chronic obstructive pulmonary disease, unspecified: Secondary | ICD-10-CM | POA: Diagnosis not present

## 2021-04-04 DIAGNOSIS — J439 Emphysema, unspecified: Secondary | ICD-10-CM | POA: Diagnosis not present

## 2021-04-04 MED ORDER — TRAMADOL HCL 50 MG PO TABS: ORAL_TABLET | ORAL | 1 refills | Status: DC

## 2021-04-04 NOTE — Telephone Encounter (Signed)
Susie calling for pt, states pt has Medicare AND medicaid. I have added the medicare to pt's record as well as the medicaid. Please advise, not sure if this will help with the rx issues. Please advise [phone] has auth # as well [id number] for health care provider)

## 2021-04-04 NOTE — Telephone Encounter (Signed)
Called [facility] and was placed on a long hold. Will call back tomorrow to see if med was filed under dher medicare part B

## 2021-04-05 DIAGNOSIS — Z7401 Bed confinement status: Secondary | ICD-10-CM | POA: Diagnosis not present

## 2021-04-05 DIAGNOSIS — J9690 Respiratory failure, unspecified, unspecified whether with hypoxia or hypercapnia: Secondary | ICD-10-CM | POA: Diagnosis not present

## 2021-04-05 DIAGNOSIS — Z743 Need for continuous supervision: Secondary | ICD-10-CM | POA: Diagnosis not present

## 2021-04-05 DIAGNOSIS — R531 Weakness: Secondary | ICD-10-CM | POA: Diagnosis not present

## 2021-04-05 DIAGNOSIS — Z931 Gastrostomy status: Secondary | ICD-10-CM | POA: Diagnosis not present

## 2021-04-08 ENCOUNTER — Other Ambulatory Visit: Payer: Self-pay | Admitting: Emergency Medicine

## 2021-04-08 NOTE — Telephone Encounter (Signed)
Kathleen Cameron states Yupelri needs prior authorization. Insurance is [employer]. Kathleen Cameron phone number is [phone].

## 2021-04-09 NOTE — Telephone Encounter (Signed)
Called and spoke with Susie from [place] letting her know info from [facility] about what we could do with pt's neb sol. Susie stated that she will be seeing pt tomorrow 12/21 and would further discuss this with them and call us back after speaking with them about decision. Will await return call.

## 2021-04-09 NOTE — Telephone Encounter (Signed)
Called and spoke with Earvin Hansen and patient's nurse Destiny to let her know what Verlon Au found out form the pharmacy and that there was a $250 copay. Also let her know that message was sent to Dr. [name] to get his recommendations and once we heard back from him we would let them know. Advised them that we do have some samples that we could provide for the patient if someone is able to pick them up. Stated they are going to see if someone would be able to come and pick it up and call us back. Will wait to hear from them in regards to samples.

## 2021-04-09 NOTE — Telephone Encounter (Signed)
Kathleen Cameron would like a callback with an update as to what's going on. I did inform him that we have been working on this behind the scenes and that it's just taking time to get everything sorted with insurance. Pt sounded relieved and would appreciate a call back to discuss further.

## 2021-04-09 NOTE — Telephone Encounter (Signed)
The only less expensive solution here is to go back to scheduled DuoNeb 4 times daily, continue the Pulmicort nebs twice daily.  We would then stop the [place], stop the [place].  This will be more affordable but she would have to take the nebs 4 times a day on a schedule.  We can make this change if she would like.

## 2021-04-09 NOTE — Telephone Encounter (Signed)
Called and spoke with pharmacist at [facility] under Part B will not go through for the [facility] She said when she filed under part B there is a pop up msg stating it must be filed under her primary HMO plan  She ran it through this and her insurance does cover this, but the copay is $250   Dr [name], please advise

## 2021-04-10 ENCOUNTER — Other Ambulatory Visit: Payer: Self-pay | Admitting: Emergency Medicine

## 2021-04-10 ENCOUNTER — Telehealth: Payer: Self-pay

## 2021-04-10 ENCOUNTER — Telehealth: Payer: Self-pay | Admitting: Emergency Medicine

## 2021-04-10 ENCOUNTER — Encounter: Payer: Self-pay | Admitting: Emergency Medicine

## 2021-04-10 DIAGNOSIS — Z930 Tracheostomy status: Secondary | ICD-10-CM | POA: Diagnosis not present

## 2021-04-10 DIAGNOSIS — J449 Chronic obstructive pulmonary disease, unspecified: Secondary | ICD-10-CM | POA: Diagnosis not present

## 2021-04-10 DIAGNOSIS — E440 Moderate protein-calorie malnutrition: Secondary | ICD-10-CM | POA: Diagnosis not present

## 2021-04-10 MED ORDER — MUPIROCIN 2 % EX OINT: 1.0000 "application " | TOPICAL_OINTMENT | Freq: Two times a day (BID) | CUTANEOUS | 2 refills | Status: AC

## 2021-04-10 MED ORDER — YUPELRI 175 MCG/3ML IN SOLN: 175.0000 ug | Freq: Every day | RESPIRATORY_TRACT | 0 refills | Status: DC

## 2021-04-10 MED ORDER — METOCLOPRAMIDE HCL 10 MG PO TABS: 10.0000 mg | ORAL_TABLET | Freq: Three times a day (TID) | ORAL | 1 refills | Status: DC | PRN

## 2021-04-10 NOTE — Telephone Encounter (Signed)
Kathleen Cameron returned my call about the neb sol after she had gone to Kathleen Cameron's house to further discuss this with them. Question that they had was why would the Brovana as well as the Yupelri be stopped as the only neb sol that they were saying was too costly was the [place]. We do have some samples of Yupelri that we are able to get for Kathleen Cameron to hold them over.  Kathleen Cameron wanted to know if Kathleen Cameron could still take the [place] along with the Pulmicort and then begin doing the Duoneb on schedule 4 times a day or if it was true that both the [place] would need to be stopped.  Dr. [name], please advise.

## 2021-04-10 NOTE — Telephone Encounter (Signed)
The brovana is redundant with the duoneb (as is the yupelri). If you wanted the least expensive regimen, you'd stop Brovana/Yulpelri (both long-acting) and replace w more frequent Duoneb (the short acting version).  It is possible to stay on Brovana, and do Duoneb on a QID schedule, but this gives redundant b-agonist (albuterol) that probably won't do much other than cause side effects.  All that said > I am ok with this regimen if they want >> Brovana bid, pulmicort bid, duoneb qid.

## 2021-04-10 NOTE — Telephone Encounter (Signed)
Called Susie and left vm about medication.

## 2021-04-10 NOTE — Telephone Encounter (Signed)
Called and spoke with Susie from [place] about the info from Dr. [name]. Stated to her that he was okay with the regimen if pt wanted: brovana bid, pulmicort bid, duoneb qid.  When stated that to [facility], she kept saying duoneb qid prn and I told her no, that the duoneb would be qid scheduled. Stated to her since pt said that she would not be able to afford the yupelri, pt would then  have to do the duoneb qid scheduled.  Susie verbalized understanding and said that she would further discuss this with pt. Nothing further needed.

## 2021-04-10 NOTE — Telephone Encounter (Signed)
Sent provider the message regarding medication and wound care. Susie the home health aid also sent a my chart request for concerns.

## 2021-04-10 NOTE — Telephone Encounter (Signed)
New prescription for metoclopramide 10 mg tablets sent to pharmacy of record.

## 2021-04-10 NOTE — Telephone Encounter (Signed)
Susie the home health aid states is it possible to send in new prescription of metoclopramide in pill form.

## 2021-04-10 NOTE — Telephone Encounter (Signed)
Patient nurse aide Susie calling in  Patient no longer has g-tube & wants to know if she can receive the medication metoCLOPramide (REGLAN) 10 MG/10ML SOLN in a pill form instead of liquid  Also states patient has small wound on bottom area less than 1cm.. no bleeding or drainage but slightly pink.. requesting ointment to assist w/ this  Pharmacy  [phone] [place], [place] - [address]  Phone:  [phone] Fax:  [phone]

## 2021-04-10 NOTE — Telephone Encounter (Signed)
Not too bad.  Recommend to use Bactroban ointment for the next 5 to 7 days to avoid infection.  I will send a prescription to pharmacy of record.

## 2021-04-19 ENCOUNTER — Other Ambulatory Visit: Payer: Self-pay | Admitting: Emergency Medicine

## 2021-04-19 DIAGNOSIS — E039 Hypothyroidism, unspecified: Secondary | ICD-10-CM

## 2021-04-26 DIAGNOSIS — L929 Granulomatous disorder of the skin and subcutaneous tissue, unspecified: Secondary | ICD-10-CM | POA: Diagnosis not present

## 2021-04-26 DIAGNOSIS — Z743 Need for continuous supervision: Secondary | ICD-10-CM | POA: Diagnosis not present

## 2021-04-26 DIAGNOSIS — R531 Weakness: Secondary | ICD-10-CM | POA: Diagnosis not present

## 2021-04-29 DIAGNOSIS — J449 Chronic obstructive pulmonary disease, unspecified: Secondary | ICD-10-CM | POA: Diagnosis not present

## 2021-04-29 DIAGNOSIS — E440 Moderate protein-calorie malnutrition: Secondary | ICD-10-CM | POA: Diagnosis not present

## 2021-05-01 DIAGNOSIS — Z9911 Dependence on respirator [ventilator] status: Secondary | ICD-10-CM | POA: Diagnosis not present

## 2021-05-01 DIAGNOSIS — J449 Chronic obstructive pulmonary disease, unspecified: Secondary | ICD-10-CM | POA: Diagnosis not present

## 2021-05-01 DIAGNOSIS — M6281 Muscle weakness (generalized): Secondary | ICD-10-CM | POA: Diagnosis not present

## 2021-05-01 DIAGNOSIS — E440 Moderate protein-calorie malnutrition: Secondary | ICD-10-CM | POA: Diagnosis not present

## 2021-05-01 DIAGNOSIS — K3184 Gastroparesis: Secondary | ICD-10-CM | POA: Diagnosis not present

## 2021-05-05 DIAGNOSIS — J449 Chronic obstructive pulmonary disease, unspecified: Secondary | ICD-10-CM | POA: Diagnosis not present

## 2021-05-05 DIAGNOSIS — J439 Emphysema, unspecified: Secondary | ICD-10-CM | POA: Diagnosis not present

## 2021-05-07 ENCOUNTER — Telehealth: Payer: Self-pay | Admitting: Emergency Medicine

## 2021-05-07 NOTE — Telephone Encounter (Signed)
Called patient's husband but he did not answer. Left message for him to call us back.

## 2021-05-08 DIAGNOSIS — E440 Moderate protein-calorie malnutrition: Secondary | ICD-10-CM | POA: Diagnosis not present

## 2021-05-08 DIAGNOSIS — J449 Chronic obstructive pulmonary disease, unspecified: Secondary | ICD-10-CM | POA: Diagnosis not present

## 2021-05-08 DIAGNOSIS — Z930 Tracheostomy status: Secondary | ICD-10-CM | POA: Diagnosis not present

## 2021-05-09 ENCOUNTER — Telehealth: Payer: Self-pay | Admitting: Emergency Medicine

## 2021-05-09 DIAGNOSIS — J449 Chronic obstructive pulmonary disease, unspecified: Secondary | ICD-10-CM

## 2021-05-09 DIAGNOSIS — J9611 Chronic respiratory failure with hypoxia: Secondary | ICD-10-CM

## 2021-05-09 DIAGNOSIS — Z930 Tracheostomy status: Secondary | ICD-10-CM

## 2021-05-09 NOTE — Telephone Encounter (Signed)
Spoke with Susie, RN with [name] With pt having medicare now she wants to do do some pulmonary rehab/therapy in the home  She is asking if we can place a referral for this  Please advise, thanks

## 2021-05-13 ENCOUNTER — Other Ambulatory Visit: Payer: Self-pay | Admitting: Emergency Medicine

## 2021-05-13 DIAGNOSIS — R1010 Upper abdominal pain, unspecified: Secondary | ICD-10-CM

## 2021-05-13 IMAGING — DX DG CHEST 1V PORT
1 series · 1 of 1 positions shown · non-contrast
Comparison: December 20, 2018 chest radiograph and chest CT September 23, 2018

CLINICAL DATA: Shortness of breath

EXAM:
PORTABLE CHEST 1 VIEW

[chest ap]
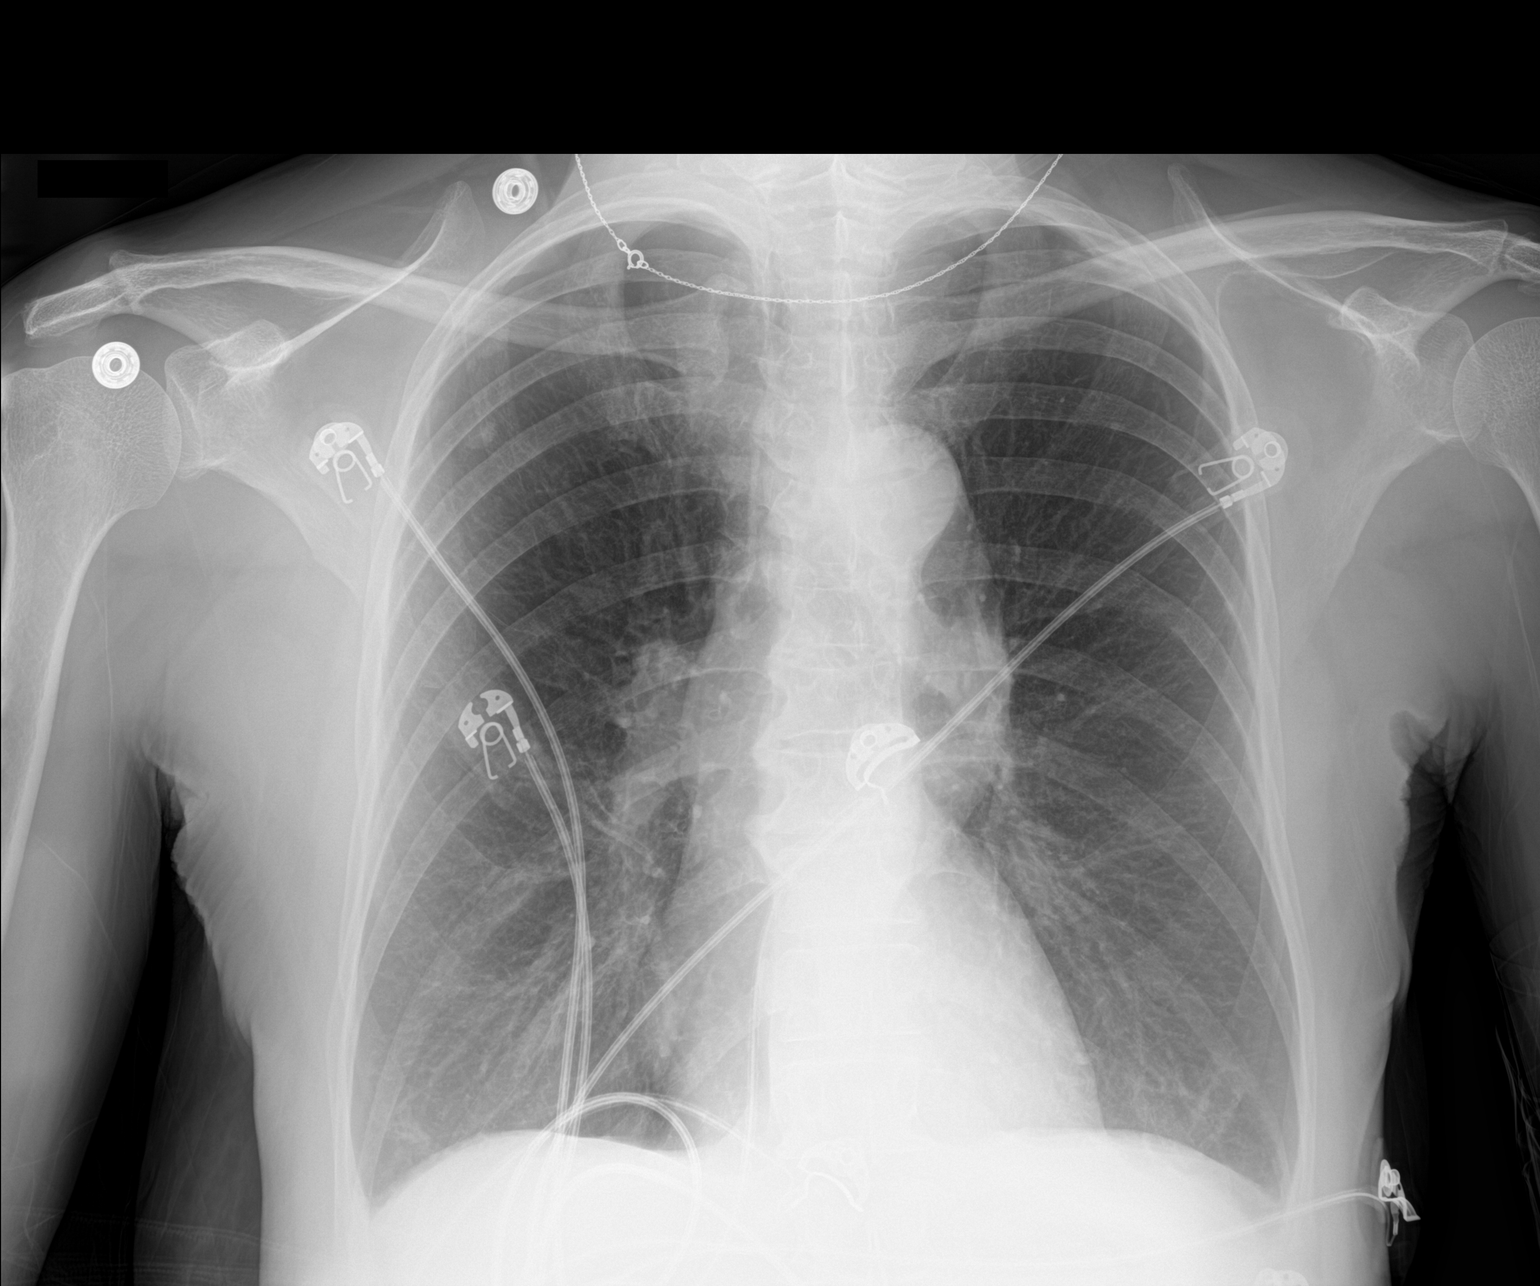

[1 of 1 positions shown; findings below may reference images not displayed]

FINDINGS: Upper lobe pulmonary nodular opacities are better seen on CT but
appear stable by radiography. There is no edema or consolidation.
Heart size and pulmonary vascularity normal. No adenopathy. Aorta is
somewhat tortuous but stable. There is degenerative change in the
thoracic spine.
IMPRESSION: Stable appearing nodular opacities in the upper lobes, largest on
the right. No edema or consolidation. Stable cardiac silhouette.
Stable aortic tortuosity.

## 2021-05-13 IMAGING — CT CT ANGIO CHEST
2 of 7 series · 18 of 46 positions shown · IV contrast (omnipaque)
Comparison: Chest CT dated 09/23/2018.

CLINICAL DATA: Increased shortness of breath, mid chest pain for 1
day.

EXAM:
CT ANGIOGRAPHY CHEST WITH CONTRAST
TECHNIQUE: Multidetector CT imaging of the chest was performed using the
standard protocol during bolus administration of intravenous
contrast. Multiplanar CT image reconstructions and MIPs were
obtained to evaluate the vascular anatomy.
CONTRAST:  75 cc OMNIPAQUE IOHEXOL 350 MG/ML SOLN

[Series 11: thins · axial · 0.54mm/px · z∈[+1150,+1400]mm · 15 of 283 slices shown]
[im 16/283  lung]
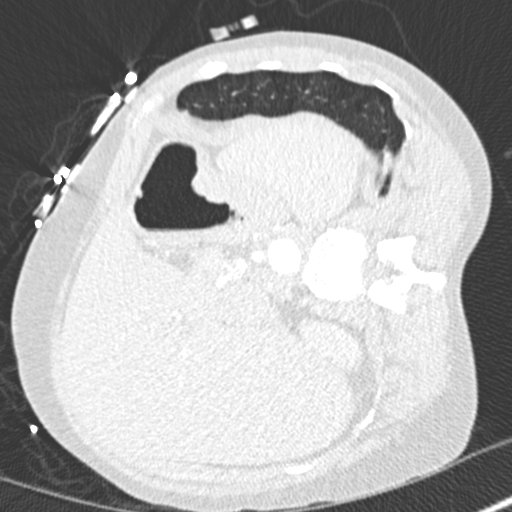
[im 32/283  soft-tissue]
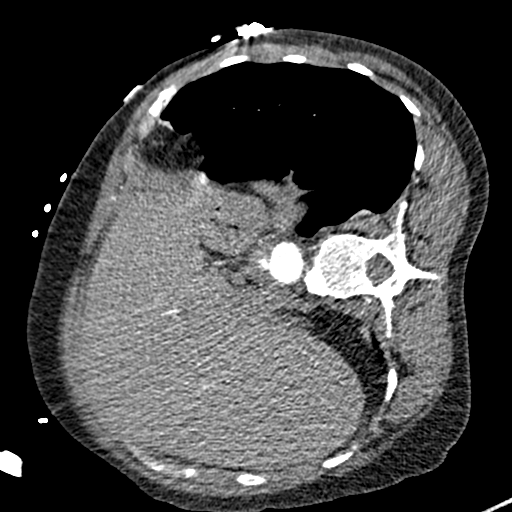
[im 48/283  lung]
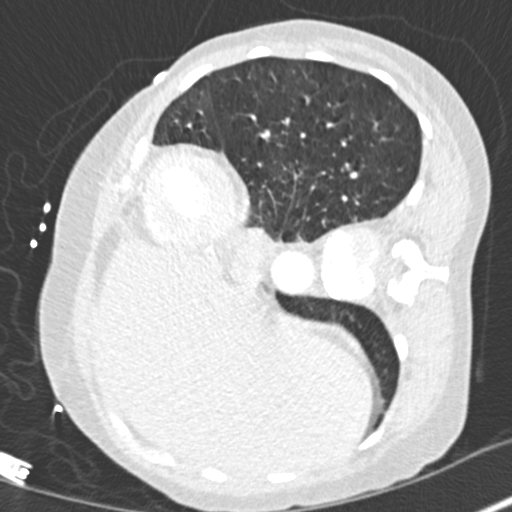
[im 63/283  soft-tissue]
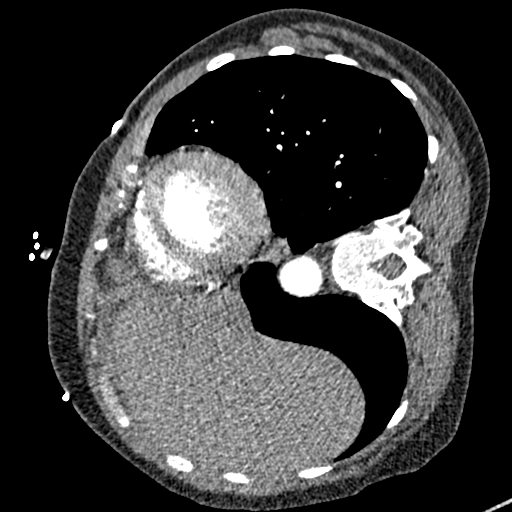
[im 95/283  lung]
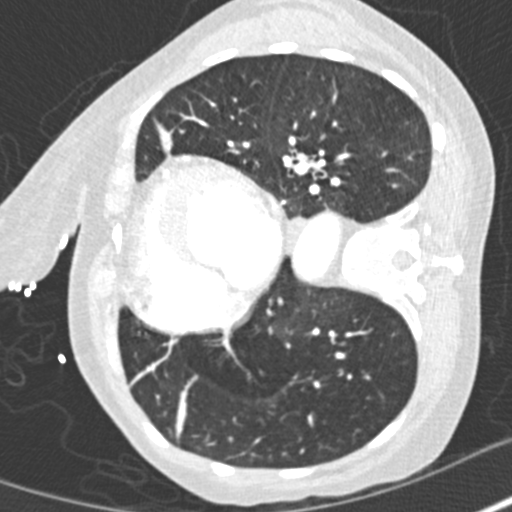
[im 110/283  soft-tissue]
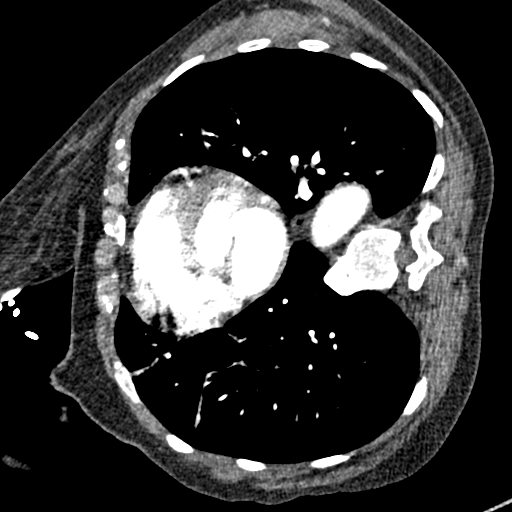
[im 126/283  lung]
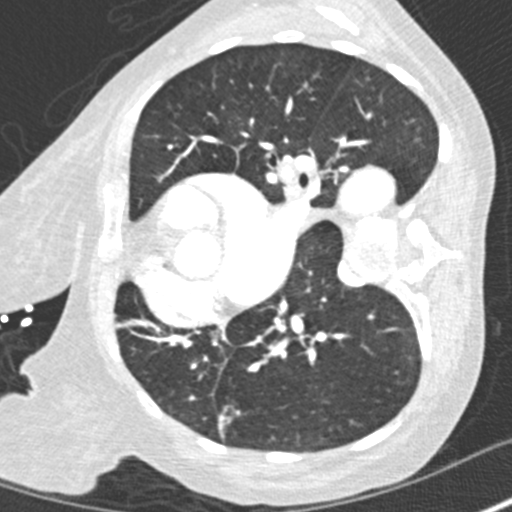
[im 142/283  soft-tissue]
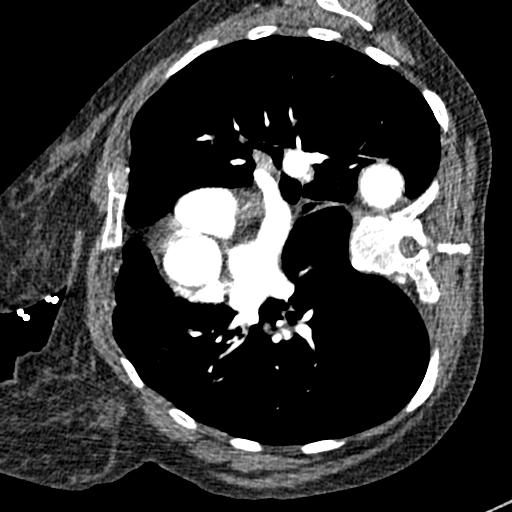
[im 157/283  lung]
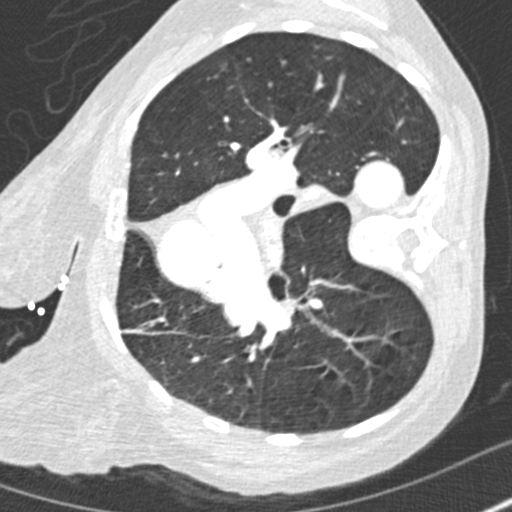
[im 173/283  soft-tissue]
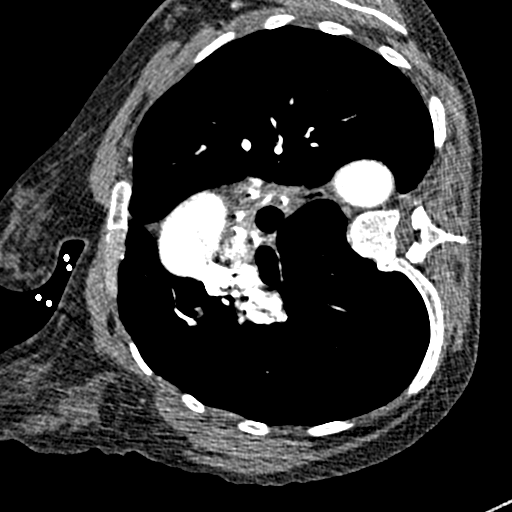
[im 189/283  lung]
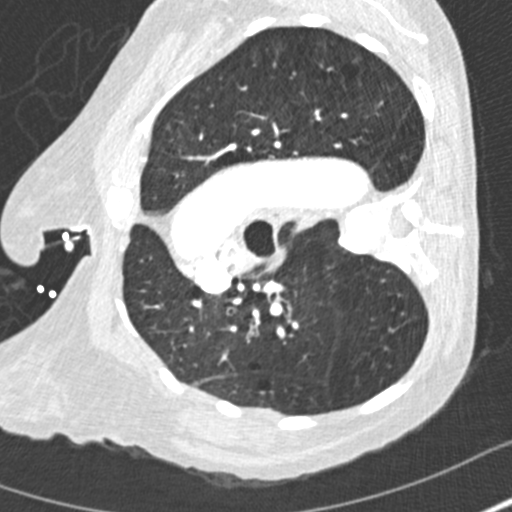
[im 220/283  soft-tissue]
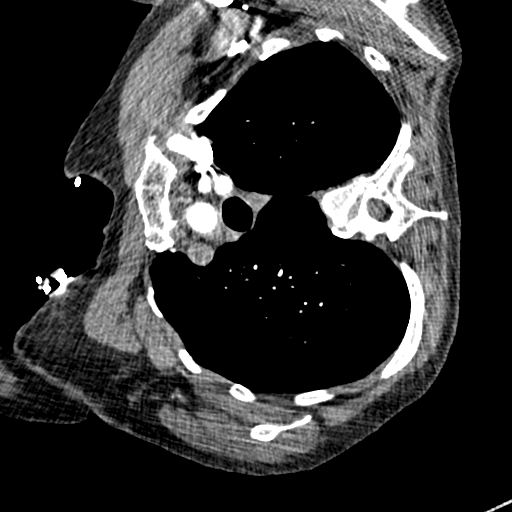
[im 236/283  lung]
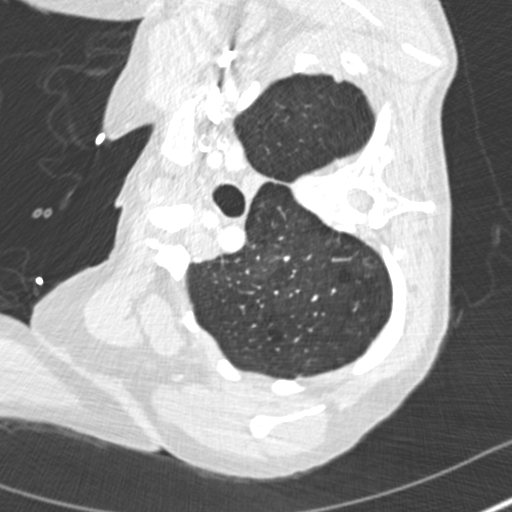
[im 251/283  soft-tissue]
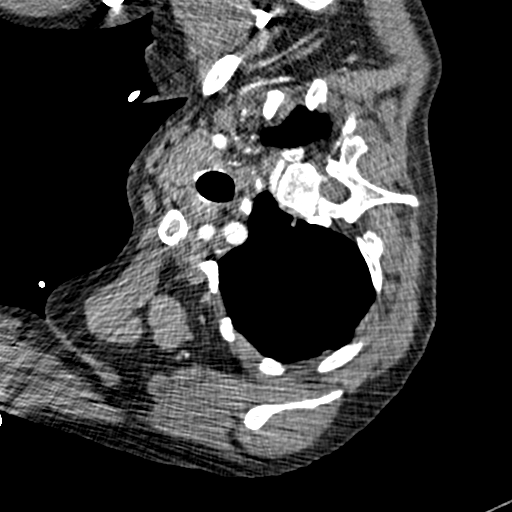
[im 267/283  lung]
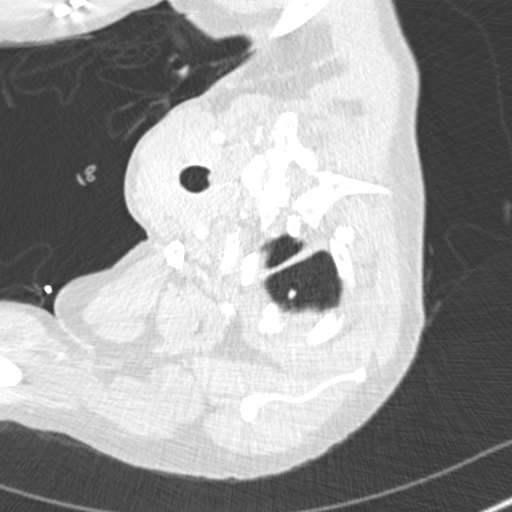

[Series 13: coronal mpr · coronal · 0.56mm/px · 3 of 135 slices shown]
[im 34/135  soft-tissue]
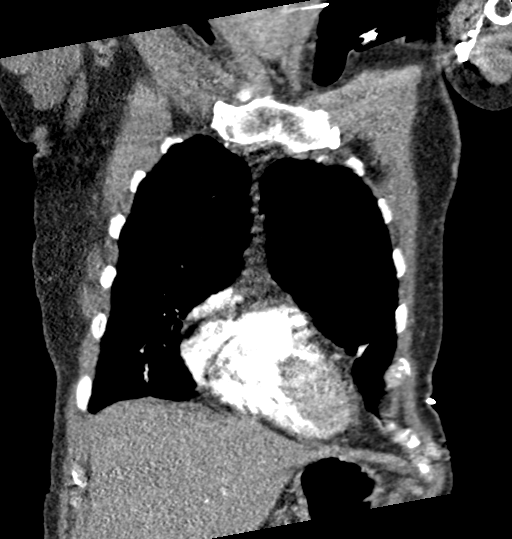
[im 68/135  soft-tissue]
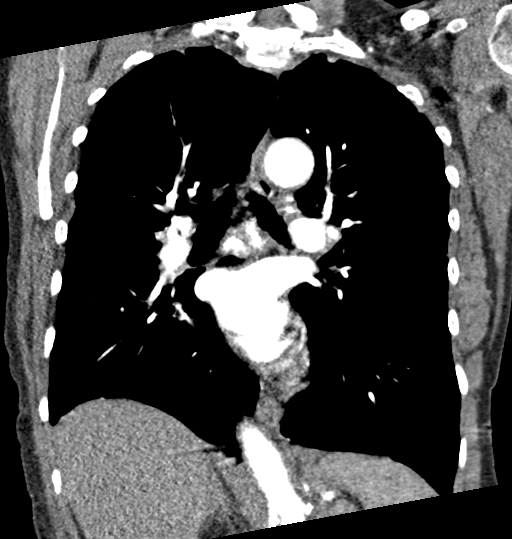
[im 101/135  soft-tissue]
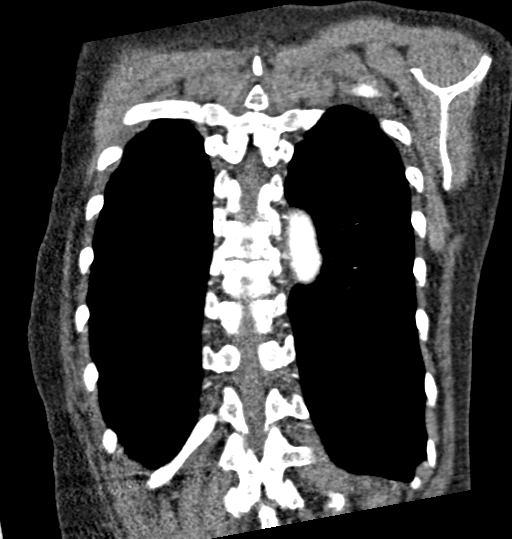

[18 of 46 positions shown; findings below may reference images not displayed]

FINDINGS: Cardiovascular: Heart size is normal. No pericardial effusion. No
thoracic aortic aneurysm or evidence of aortic dissection.

Mediastinum/Nodes: Numerous calcified lymph nodes within the
mediastinum and perihilar regions indicating previous granulomatous
infection. No enlarged or suspicious lymph nodes. Esophagus is
unremarkable. Trachea and central bronchi are unremarkable.

Lungs/Pleura: Mild scarring/atelectasis at the lung bases. No
evidence of pneumonia. No pleural effusion or pneumothorax. Again
noted are scattered pleural based nodules which are not
significantly changed in the interval, some again noted to be
partially calcified suggesting sequela of previous granulomatous
infection, silicosis or other pneumoconiosis, sarcoidosis, or less
likely sequela of asbestos exposure.

Stable emphysematous changes within the upper lobes, mild to
moderate in degree.

Upper Abdomen: Limited images of the upper abdomen are unremarkable.

Musculoskeletal: No acute or suspicious osseous finding. Mild
degenerative spondylosis of the scoliotic thoracic spine.

Review of the MIP images confirms the above findings.
IMPRESSION: 1. No acute findings. No evidence of pneumonia or pulmonary edema.
No thoracic aortic aneurysm.
2. Numerous calcified lymph nodes within the mediastinum and
perihilar regions, and stable small pleural based nodules within
each lung, all of which is compatible with previous granulomatous
infection, chronic silicosis or other pneumoconiosis, sarcoidosis or
less likely sequela of asbestos exposure.
3. Additional chronic/incidental findings detailed above.

Emphysema ([id number]).

## 2021-05-15 ENCOUNTER — Other Ambulatory Visit: Payer: Self-pay | Admitting: Emergency Medicine

## 2021-05-15 NOTE — Telephone Encounter (Signed)
Yes I am ok with this. Please order through [facility]. Thank you

## 2021-05-15 NOTE — Telephone Encounter (Signed)
Called and spoke with Kathleen Cameron letting her know the info per RB and she verbalized understanding. Orders have been placed for both respiratory therapy and physical therapy and did specify to try to get through [facility] if able and if not, then whomever is in network with insurance. Nothing further needed.

## 2021-05-19 ENCOUNTER — Other Ambulatory Visit: Payer: Self-pay | Admitting: Emergency Medicine

## 2021-05-23 ENCOUNTER — Other Ambulatory Visit: Payer: Self-pay | Admitting: Emergency Medicine

## 2021-05-24 ENCOUNTER — Telehealth: Payer: Self-pay | Admitting: Emergency Medicine

## 2021-05-24 MED ORDER — AZITHROMYCIN 250 MG PO TABS: ORAL_TABLET | ORAL | 0 refills | Status: AC

## 2021-05-24 NOTE — Telephone Encounter (Signed)
I have been working on this for several days.  [facility] could do the PT but not RT so I have been trying to find a company to do both and have been unsuccessful.  No one is doing RT now and they don't know anyone who is.  I have contacted several companies and have them documented int he referral.  I have gone back to [place] to do the PT. Sarah that I spoke to there said they should be able to do it but just has to be coordinated with Susie the [facility] nurse so she can be there when they come due to they are not certified in trach care.  The RT order is still open and I don't know what you want to do about it.  Please advise.

## 2021-05-24 NOTE — Telephone Encounter (Signed)
Susie calling for pt. States she requested refill for pt of zithromyacinth last week and hasnt' heard anything. Also states she had a missed call from [place] but that there was no voicemail left. I did not see where any documention was for a call. [facility]acy [facility] pt [facility] on [address].

## 2021-05-24 NOTE — Telephone Encounter (Signed)
Azithromycin sent to preferred pharmacy.  Susie, RN is aware and voiced his understanding.  Nothing further needed.

## 2021-05-27 NOTE — Telephone Encounter (Signed)
I think that [facility] nursing she will be able to provide standard trach care.  You can close the RT order request

## 2021-05-30 ENCOUNTER — Telehealth: Payer: Self-pay | Admitting: Emergency Medicine

## 2021-05-30 DIAGNOSIS — J449 Chronic obstructive pulmonary disease, unspecified: Secondary | ICD-10-CM | POA: Diagnosis not present

## 2021-05-30 DIAGNOSIS — E440 Moderate protein-calorie malnutrition: Secondary | ICD-10-CM | POA: Diagnosis not present

## 2021-05-30 NOTE — Telephone Encounter (Signed)
Recommendation is she needs to see a dentist.

## 2021-05-30 NOTE — Telephone Encounter (Signed)
Kathleen Cameron w/ [facility] states pt is having right molar tooth ache, she states the tooth is loose and pt is currently taking azithromycin per  pulmonologist  Inquired if pt has a dentist, caller stated she does not, offered caller an appt, caller stated pt is asleep and will ask her once she awakens and give office a call back   Caller states family requesting provider's recommendations

## 2021-05-31 NOTE — Telephone Encounter (Signed)
Called Rosalita Chessman for [facility] and left a vm of provider's recommendations.

## 2021-06-01 DIAGNOSIS — E440 Moderate protein-calorie malnutrition: Secondary | ICD-10-CM | POA: Diagnosis not present

## 2021-06-01 DIAGNOSIS — J449 Chronic obstructive pulmonary disease, unspecified: Secondary | ICD-10-CM | POA: Diagnosis not present

## 2021-06-01 DIAGNOSIS — K3184 Gastroparesis: Secondary | ICD-10-CM | POA: Diagnosis not present

## 2021-06-04 ENCOUNTER — Telehealth: Payer: Self-pay | Admitting: Emergency Medicine

## 2021-06-05 ENCOUNTER — Telehealth: Payer: Self-pay | Admitting: Emergency Medicine

## 2021-06-05 DIAGNOSIS — Z743 Need for continuous supervision: Secondary | ICD-10-CM | POA: Diagnosis not present

## 2021-06-05 DIAGNOSIS — R531 Weakness: Secondary | ICD-10-CM | POA: Diagnosis not present

## 2021-06-05 DIAGNOSIS — E440 Moderate protein-calorie malnutrition: Secondary | ICD-10-CM | POA: Diagnosis not present

## 2021-06-05 DIAGNOSIS — R404 Transient alteration of awareness: Secondary | ICD-10-CM | POA: Diagnosis not present

## 2021-06-05 DIAGNOSIS — R6889 Other general symptoms and signs: Secondary | ICD-10-CM | POA: Diagnosis not present

## 2021-06-05 DIAGNOSIS — L7682 Other postprocedural complications of skin and subcutaneous tissue: Secondary | ICD-10-CM | POA: Diagnosis not present

## 2021-06-05 DIAGNOSIS — K316 Fistula of stomach and duodenum: Secondary | ICD-10-CM | POA: Diagnosis not present

## 2021-06-05 DIAGNOSIS — J449 Chronic obstructive pulmonary disease, unspecified: Secondary | ICD-10-CM | POA: Diagnosis not present

## 2021-06-05 DIAGNOSIS — Z930 Tracheostomy status: Secondary | ICD-10-CM | POA: Diagnosis not present

## 2021-06-05 DIAGNOSIS — Z7401 Bed confinement status: Secondary | ICD-10-CM | POA: Diagnosis not present

## 2021-06-05 DIAGNOSIS — J439 Emphysema, unspecified: Secondary | ICD-10-CM | POA: Diagnosis not present

## 2021-06-07 ENCOUNTER — Telehealth: Payer: Self-pay | Admitting: Emergency Medicine

## 2021-06-07 DIAGNOSIS — Z930 Tracheostomy status: Secondary | ICD-10-CM | POA: Diagnosis not present

## 2021-06-07 DIAGNOSIS — J449 Chronic obstructive pulmonary disease, unspecified: Secondary | ICD-10-CM | POA: Diagnosis not present

## 2021-06-07 DIAGNOSIS — E440 Moderate protein-calorie malnutrition: Secondary | ICD-10-CM | POA: Diagnosis not present

## 2021-06-07 NOTE — Telephone Encounter (Signed)
Called and spoke with Kathleen Cameron about the call in regards to pt's Yupelri and stated to her that we would look into this for pt.  Went online to the [employer] to try to see if pt could be enrolled. When put in there pt's insurance, received a message stating that pt was not available for the savings card due to insurance.  Called Kathleen Cameron back and stated this info to her. Kathleen Cameron stated she was going to reach out to pt's medicare advocate Kathleen Cameron for help with this. Also stated to her that we could always try to send the Rx to [facility] for pt to see if she might be able to get the med cheaper there. Stated to her that [facility] was a Energy manager. She said that she would discuss this with pt's spouse to see if he would want to give this a try to see how much the med would cost for pt. She also stated after reaching out to [facility] that she would call us back.

## 2021-06-07 NOTE — Telephone Encounter (Signed)
Called company and they are faxing over new form

## 2021-06-08 ENCOUNTER — Emergency Department (HOSPITAL_COMMUNITY): Payer: Medicare Other

## 2021-06-08 ENCOUNTER — Emergency Department (HOSPITAL_COMMUNITY)
Admission: EM | Admit: 2021-06-08 | Discharge: 2021-06-09 | Disposition: A | Discharge status: Home / Self Care | Payer: Medicare Other | Attending: Emergency Medicine | Admitting: Emergency Medicine

## 2021-06-08 ENCOUNTER — Encounter (HOSPITAL_COMMUNITY): Payer: Self-pay

## 2021-06-08 DIAGNOSIS — J45909 Unspecified asthma, uncomplicated: Secondary | ICD-10-CM | POA: Insufficient documentation

## 2021-06-08 DIAGNOSIS — D72829 Elevated white blood cell count, unspecified: Secondary | ICD-10-CM | POA: Insufficient documentation

## 2021-06-08 DIAGNOSIS — Z20822 Contact with and (suspected) exposure to covid-19: Secondary | ICD-10-CM | POA: Insufficient documentation

## 2021-06-08 DIAGNOSIS — J9690 Respiratory failure, unspecified, unspecified whether with hypoxia or hypercapnia: Secondary | ICD-10-CM | POA: Diagnosis not present

## 2021-06-08 DIAGNOSIS — R1013 Epigastric pain: Secondary | ICD-10-CM | POA: Diagnosis not present

## 2021-06-08 DIAGNOSIS — J449 Chronic obstructive pulmonary disease, unspecified: Secondary | ICD-10-CM | POA: Diagnosis not present

## 2021-06-08 DIAGNOSIS — R0602 Shortness of breath: Secondary | ICD-10-CM | POA: Diagnosis not present

## 2021-06-08 DIAGNOSIS — R109 Unspecified abdominal pain: Secondary | ICD-10-CM | POA: Diagnosis not present

## 2021-06-08 DIAGNOSIS — I10 Essential (primary) hypertension: Secondary | ICD-10-CM | POA: Diagnosis not present

## 2021-06-08 DIAGNOSIS — I700 Atherosclerosis of aorta: Secondary | ICD-10-CM | POA: Diagnosis not present

## 2021-06-08 DIAGNOSIS — Z743 Need for continuous supervision: Secondary | ICD-10-CM | POA: Diagnosis not present

## 2021-06-08 DIAGNOSIS — J439 Emphysema, unspecified: Secondary | ICD-10-CM | POA: Diagnosis not present

## 2021-06-08 DIAGNOSIS — A419 Sepsis, unspecified organism: Secondary | ICD-10-CM | POA: Diagnosis not present

## 2021-06-08 LAB — COMPREHENSIVE METABOLIC PANEL
ALT: 19 U/L (ref 0–44)
AST: 37 U/L (ref 15–41)
Albumin: 4.3 g/dL (ref 3.5–5.0)
Alkaline Phosphatase: 186 U/L — ABNORMAL HIGH (ref 38–126)
Anion gap: 9 (ref 5–15)
BUN: 15 mg/dL (ref 6–20)
CO2: 34 mmol/L — ABNORMAL HIGH (ref 22–32)
Calcium: 10 mg/dL (ref 8.9–10.3)
Chloride: 94 mmol/L — ABNORMAL LOW (ref 98–111)
Creatinine, Ser: 0.97 mg/dL (ref 0.44–1.00)
GFR, Estimated: >60 mL/min (ref >60)
Glucose, Bld: 254 mg/dL — ABNORMAL HIGH (ref 70–99)
Potassium: 4 mmol/L (ref 3.5–5.1)
Sodium: 137 mmol/L (ref 135–145)
Total Bilirubin: 0.3 mg/dL (ref 0.3–1.2)
Total Protein: 9.7 g/dL — ABNORMAL HIGH (ref 6.5–8.1)

## 2021-06-08 LAB — URINALYSIS, ROUTINE W REFLEX MICROSCOPIC
Bacteria, UA: NONE SEEN
Bilirubin Urine: NEGATIVE
Glucose, UA: 50 mg/dL — AB
Hgb urine dipstick: NEGATIVE
Ketones, ur: NEGATIVE mg/dL
Leukocytes,Ua: NEGATIVE
Nitrite: NEGATIVE
Protein, ur: 30 mg/dL — AB
Specific Gravity, Urine: 1.009 (ref 1.005–1.030)
pH: 6 (ref 5.0–8.0)

## 2021-06-08 LAB — CBC WITH DIFFERENTIAL/PLATELET
Abs Immature Granulocytes: 0.05 10*3/uL (ref 0.00–0.07)
Basophils Absolute: 0.1 10*3/uL (ref 0.0–0.1)
Basophils Relative: 1 %
Eosinophils Absolute: 0.2 10*3/uL (ref 0.0–0.5)
Eosinophils Relative: 2 %
HCT: 38.4 % (ref 36.0–46.0)
Hemoglobin: 11.8 g/dL — ABNORMAL LOW (ref 12.0–15.0)
Immature Granulocytes: 0 %
Lymphocytes Relative: 7 %
Lymphs Abs: 0.9 10*3/uL (ref 0.7–4.0)
MCH: 27.5 pg (ref 26.0–34.0)
MCHC: 30.7 g/dL (ref 30.0–36.0)
MCV: 89.5 fL (ref 80.0–100.0)
Monocytes Absolute: 0.6 10*3/uL (ref 0.1–1.0)
Monocytes Relative: 5 %
Neutro Abs: 10.1 10*3/uL — ABNORMAL HIGH (ref 1.7–7.7)
Neutrophils Relative %: 85 %
Platelets: 334 10*3/uL (ref 150–400)
RBC: 4.29 MIL/uL (ref 3.87–5.11)
RDW: 15.4 % (ref 11.5–15.5)
WBC: 11.9 10*3/uL — ABNORMAL HIGH (ref 4.0–10.5)
nRBC: 0 % (ref 0.0–0.2)

## 2021-06-08 LAB — RESP PANEL BY RT-PCR (FLU A&B, COVID) ARPGX2
Influenza A by PCR: NEGATIVE
Influenza B by PCR: NEGATIVE
SARS Coronavirus 2 by RT PCR: NEGATIVE

## 2021-06-08 LAB — LIPASE, BLOOD: Lipase: 26 U/L (ref 11–51)

## 2021-06-08 MED ORDER — MORPHINE SULFATE (PF) 4 MG/ML IV SOLN
4.0000 mg | Freq: Once | INTRAVENOUS | Status: AC
  Administered 2021-06-08: 4 mg via INTRAVENOUS
  Filled 2021-06-08: qty 1

## 2021-06-08 MED ORDER — IOHEXOL 300 MG/ML  SOLN
100.0000 mL | Freq: Once | INTRAMUSCULAR | Status: AC | PRN
  Administered 2021-06-08: 100 mL via INTRAVENOUS

## 2021-06-08 MED ORDER — LORAZEPAM 2 MG/ML IJ SOLN
1.0000 mg | Freq: Once | INTRAMUSCULAR | Status: AC
  Administered 2021-06-08: 1 mg via INTRAVENOUS
  Filled 2021-06-08: qty 1

## 2021-06-08 MED ORDER — SODIUM CHLORIDE 0.9 % IV BOLUS
500.0000 mL | Freq: Once | INTRAVENOUS | Status: AC
  Administered 2021-06-08: 500 mL via INTRAVENOUS

## 2021-06-08 NOTE — ED Provider Notes (Signed)
° °  Emergency Department Provider Note   I have reviewed the triage vital signs and the nursing notes.  Duplicate note  Procedures    Note:  This document was prepared using Dragon voice recognition software and may include unintentional dictation errors.  Alona Bene, MD, [facility] Emergency Medicine    Arabell Neria, Arlyss Repress, MD 06/13/21 [phone]

## 2021-06-08 NOTE — ED Provider Notes (Signed)
Glucose  Emergency Department Provider Note   I have reviewed the triage vital signs and the nursing notes.   HISTORY  Chief Complaint Abdominal Pain   HPI Kathleen Cameron is a 56 y.o. female with past medical history reviewed below including chronic respiratory failure on home ventilator presents with abdominal discomfort.  Patient's husband is at bedside and states that their home health team recently changed her tidal volume on her home vent from 480 mL to 400 mL.  He is unsure the prompt further change but states that since the patient found out about this she has been seeming more anxious.  She herself is not describing any trouble breathing but the husband states that whenever her anxiety increases she often complains of abdominal discomfort.  She has not been having fevers or chills.  No bleeding from the trach.  She has a G-tube in place and has been states its been working well.  No vomiting or diarrhea.   Past Medical History:  Diagnosis Date   Acute on chronic respiratory failure with hypoxia (HCC)    Anxiety disorder    Asthma    COPD (chronic obstructive pulmonary disease) (HCC)    COPD, severe (HCC)    Hypertension    Seizure disorder (HCC)     Review of Systems  Constitutional: No fever/chills Eyes: No visual changes. ENT: No sore throat. Cardiovascular: Denies chest pain. Respiratory: Denies shortness of breath. Gastrointestinal: Positive abdominal pain.  No nausea, no vomiting.  No diarrhea.  No constipation. Genitourinary: Negative for dysuria. Musculoskeletal: Negative for back pain. Skin: Negative for rash. Neurological: Negative for headaches, focal weakness or numbness.   ____________________________________________   PHYSICAL EXAM:  VITAL SIGNS: ED Triage Vitals  Enc Vitals Group     BP 06/08/21 1625 (!) 142/98     Pulse Rate 06/08/21 1625 97     Resp 06/08/21 1625 19     Temp 06/08/21 1625 98.3 F (36.8 C)     Temp Source 06/08/21  1625 Oral     SpO2 06/08/21 1625 97 %     Weight 06/08/21 1622 117 lb (53.1 kg)     Height 06/08/21 1600 5\' 5"  (1.651 m)   Constitutional: Alert and oriented. Well appearing and in no acute distress. Eyes: Conjunctivae are normal.  Head: Atraumatic. Nose: No congestion/rhinnorhea. Mouth/Throat: Mucous membranes are moist.   Neck: No stridor. Well appearing trach without bleeding or discharge.  Cardiovascular: Normal rate, regular rhythm. Good peripheral circulation. Grossly normal heart sounds.   Respiratory: Normal respiratory effort.  No retractions. Lungs CTAB. Gastrointestinal: Soft and nontender. No distention.  Skin:  Skin is warm, dry and intact. No rash noted.   ____________________________________________   LABS (all labs ordered are listed, but only abnormal results are displayed)  Labs Reviewed  CBC WITH DIFFERENTIAL/PLATELET - Abnormal; Notable for the following components:      Result Value   WBC 11.9 (*)    Hemoglobin 11.8 (*)    Neutro Abs 10.1 (*)    All other components within normal limits  COMPREHENSIVE METABOLIC PANEL - Abnormal; Notable for the following components:   Chloride 94 (*)    CO2 34 (*)    Glucose, Bld 254 (*)    Total Protein 9.7 (*)    Alkaline Phosphatase 186 (*)    All other components within normal limits  URINALYSIS, ROUTINE W REFLEX MICROSCOPIC - Abnormal; Notable for the following components:   Glucose, UA 50 (*)    Protein, ur 30 (*)  All other components within normal limits  RESP PANEL BY RT-PCR (FLU A&B, COVID) ARPGX2  LIPASE, BLOOD   ____________________________________________  EKG   EKG Interpretation  Date/Time:  Saturday June 08 2021 16:30:37 EST Ventricular Rate:  99 PR Interval:  166 QRS Duration: 86 QT Interval:  366 QTC Calculation: 470 R Axis:   92 Text Interpretation: Sinus rhythm Right atrial enlargement Probable anterior infarct, age indeterminate Artifact in lead(s) I II III aVR aVL aVF V1 V3 V4 V5  V6 Confirmed by Alona Bene [phone]) on 06/08/2021 4:40:57 PM        ____________________________________________  RADIOLOGY  DG Abd Portable 1V  Result Date: 06/12/2021 CLINICAL DATA:  Feeding tube placement EXAM: PORTABLE ABDOMEN - 1 VIEW COMPARISON:  12/25/2020 FINDINGS: Feeding tube enters the stomach with the tip in the proximal body of the stomach. Normal bowel gas pattern. Right femoral central venous catheter tip in the IVC overlying the L5 region. Probable Foley catheter in the bladder. Surgical clip in the pelvis. IMPRESSION: Feeding tube coiled in the stomach with the tip in the proximal body of the stomach. Electronically Signed   By: Marlan Palau M.D.   On: 06/12/2021 12:01    ____________________________________________   PROCEDURES  Procedure(s) performed:   Procedures   ____________________________________________   INITIAL IMPRESSION / ASSESSMENT AND PLAN / ED COURSE  Pertinent labs & imaging results that were available during my care of the patient were reviewed by me and considered in my medical decision making (see chart for details).   This patient is Presenting for Evaluation of abdominal pain, which does require a range of treatment options, and is a complaint that involves a high risk of morbidity and mortality.  The Differential Diagnoses includes but is not exclusive to acute cholecystitis, intrathoracic causes for epigastric abdominal pain, gastritis, duodenitis, pancreatitis, small bowel or large bowel obstruction, abdominal aortic aneurysm, hernia, gastritis, etc.   Critical Interventions- IVF and IV ativan   Medications  sodium chloride 0.9 % bolus 500 mL ( Intravenous Stopped 06/08/21 1816)  LORazepam (ATIVAN) injection 1 mg (1 mg Intravenous Given 06/08/21 1657)  morphine (PF) 4 MG/ML injection 4 mg (4 mg Intravenous Given 06/08/21 1935)  iohexol (OMNIPAQUE) 300 MG/ML solution 100 mL (100 mLs Intravenous Contrast Given 06/08/21 2138)     Reassessment after intervention:     I did obtain Additional Historical Information from husband at bedside who is the primary caregiver.   I decided to review pertinent External Data, and in summary no recent ED visits.   Clinical Laboratory Tests Ordered, included CBC with mild leukocytosis.  No severe anemia.  COVID and flu negative.  UA negative.  This elevated but no evidence of DKA.  Lipase normal.  Radiologic Tests Ordered, included CT chest abdomen and pelvis along with CXR. I independently interpreted the images and agree with radiology interpretation.   Cardiac Monitor Tracing which shows NSR.   Social Determinants of Health Risk patient is a non-smoker.    Medical Decision Making: Summary:  Patient presents emergency department for evaluation of abdominal discomfort.  Husband notes history of similar abdominal pains associated with anxiety.  Unclear if this is the cause but will need at least screening labs and serial abdominal exams to rule out additional issue.  There have been some home vent setting changes. Patient with TV here. Will get a CXR to evaluate further for infiltrate, PNX, or pulmonary edema.   Reevaluation with update and discussion with patient.  CT imaging shows  no acute findings in the lungs or abdomen/pelvis to explain patient's symptoms.  She is much more comfortable after pain medication here.  Unable to change her vent settings but discussed with husband and they can contact the home health team for vent setting changes.  Patient seems comfortable and stable for plan at discharge.  Husband is in agreement.  Disposition: discahrge  ____________________________________________  FINAL CLINICAL IMPRESSION(S) / ED DIAGNOSES  Final diagnoses:  Epigastric pain    Note:  This document was prepared using Dragon voice recognition software and may include unintentional dictation errors.  Alona Bene, MD, [facility] Emergency Medicine    Arvid Marengo, Arlyss Repress, MD 06/13/21 [phone]

## 2021-06-08 NOTE — ED Triage Notes (Signed)
Pt BIBA from home. Yesterday, vent setting were change- tidal volume lowered. Generalized abd since. No N/V/D, regular BMs.  Soft non distended abd.  Pt anxious appearing  HR 70 96% on home vent

## 2021-06-09 ENCOUNTER — Other Ambulatory Visit: Payer: Self-pay

## 2021-06-09 ENCOUNTER — Inpatient Hospital Stay (HOSPITAL_COMMUNITY)
Admission: EM | Admit: 2021-06-09 | Discharge: 2021-07-20 | DRG: 207 | Disposition: E | Payer: Medicare Other | Attending: Internal Medicine | Admitting: Internal Medicine

## 2021-06-09 ENCOUNTER — Emergency Department (HOSPITAL_COMMUNITY): Payer: Medicare Other

## 2021-06-09 ENCOUNTER — Emergency Department (HOSPITAL_COMMUNITY)
Admission: EM | Admit: 2021-06-09 | Discharge: 2021-06-09 | Disposition: A | Discharge status: Home / Self Care | Payer: Medicare Other | Attending: Emergency Medicine | Admitting: Emergency Medicine

## 2021-06-09 DIAGNOSIS — R0902 Hypoxemia: Secondary | ICD-10-CM | POA: Diagnosis not present

## 2021-06-09 DIAGNOSIS — Z931 Gastrostomy status: Secondary | ICD-10-CM

## 2021-06-09 DIAGNOSIS — Z66 Do not resuscitate: Secondary | ICD-10-CM | POA: Diagnosis not present

## 2021-06-09 DIAGNOSIS — Z79899 Other long term (current) drug therapy: Secondary | ICD-10-CM | POA: Diagnosis not present

## 2021-06-09 DIAGNOSIS — R109 Unspecified abdominal pain: Secondary | ICD-10-CM

## 2021-06-09 DIAGNOSIS — I952 Hypotension due to drugs: Secondary | ICD-10-CM | POA: Diagnosis not present

## 2021-06-09 DIAGNOSIS — J398 Other specified diseases of upper respiratory tract: Secondary | ICD-10-CM | POA: Diagnosis not present

## 2021-06-09 DIAGNOSIS — J9622 Acute and chronic respiratory failure with hypercapnia: Secondary | ICD-10-CM

## 2021-06-09 DIAGNOSIS — R079 Chest pain, unspecified: Secondary | ICD-10-CM | POA: Diagnosis not present

## 2021-06-09 DIAGNOSIS — L249 Irritant contact dermatitis, unspecified cause: Secondary | ICD-10-CM | POA: Diagnosis present

## 2021-06-09 DIAGNOSIS — Z4800 Encounter for change or removal of nonsurgical wound dressing: Secondary | ICD-10-CM | POA: Insufficient documentation

## 2021-06-09 DIAGNOSIS — R739 Hyperglycemia, unspecified: Secondary | ICD-10-CM | POA: Diagnosis not present

## 2021-06-09 DIAGNOSIS — R1110 Vomiting, unspecified: Secondary | ICD-10-CM

## 2021-06-09 DIAGNOSIS — E872 Acidosis, unspecified: Secondary | ICD-10-CM | POA: Diagnosis not present

## 2021-06-09 DIAGNOSIS — R54 Age-related physical debility: Secondary | ICD-10-CM | POA: Diagnosis present

## 2021-06-09 DIAGNOSIS — Z7901 Long term (current) use of anticoagulants: Secondary | ICD-10-CM | POA: Diagnosis not present

## 2021-06-09 DIAGNOSIS — R072 Precordial pain: Secondary | ICD-10-CM | POA: Diagnosis not present

## 2021-06-09 DIAGNOSIS — E876 Hypokalemia: Secondary | ICD-10-CM | POA: Diagnosis not present

## 2021-06-09 DIAGNOSIS — J9690 Respiratory failure, unspecified, unspecified whether with hypoxia or hypercapnia: Secondary | ICD-10-CM

## 2021-06-09 DIAGNOSIS — F419 Anxiety disorder, unspecified: Secondary | ICD-10-CM | POA: Diagnosis present

## 2021-06-09 DIAGNOSIS — D6489 Other specified anemias: Secondary | ICD-10-CM | POA: Diagnosis present

## 2021-06-09 DIAGNOSIS — I9581 Postprocedural hypotension: Secondary | ICD-10-CM | POA: Diagnosis not present

## 2021-06-09 DIAGNOSIS — Z9911 Dependence on respirator [ventilator] status: Secondary | ICD-10-CM

## 2021-06-09 DIAGNOSIS — J9621 Acute and chronic respiratory failure with hypoxia: Secondary | ICD-10-CM | POA: Diagnosis present

## 2021-06-09 DIAGNOSIS — L8990 Pressure ulcer of unspecified site, unspecified stage: Secondary | ICD-10-CM | POA: Diagnosis present

## 2021-06-09 DIAGNOSIS — R404 Transient alteration of awareness: Secondary | ICD-10-CM | POA: Diagnosis not present

## 2021-06-09 DIAGNOSIS — Z0189 Encounter for other specified special examinations: Secondary | ICD-10-CM

## 2021-06-09 DIAGNOSIS — Z515 Encounter for palliative care: Secondary | ICD-10-CM | POA: Diagnosis not present

## 2021-06-09 DIAGNOSIS — R0602 Shortness of breath: Secondary | ICD-10-CM

## 2021-06-09 DIAGNOSIS — R911 Solitary pulmonary nodule: Secondary | ICD-10-CM | POA: Diagnosis not present

## 2021-06-09 DIAGNOSIS — E039 Hypothyroidism, unspecified: Secondary | ICD-10-CM | POA: Diagnosis present

## 2021-06-09 DIAGNOSIS — J441 Chronic obstructive pulmonary disease with (acute) exacerbation: Secondary | ICD-10-CM

## 2021-06-09 DIAGNOSIS — Z978 Presence of other specified devices: Secondary | ICD-10-CM

## 2021-06-09 DIAGNOSIS — R1012 Left upper quadrant pain: Secondary | ICD-10-CM | POA: Diagnosis not present

## 2021-06-09 DIAGNOSIS — J439 Emphysema, unspecified: Principal | ICD-10-CM | POA: Diagnosis present

## 2021-06-09 DIAGNOSIS — J156 Pneumonia due to other aerobic Gram-negative bacteria: Secondary | ICD-10-CM | POA: Diagnosis present

## 2021-06-09 DIAGNOSIS — R000 Tachycardia, unspecified: Secondary | ICD-10-CM | POA: Diagnosis not present

## 2021-06-09 DIAGNOSIS — J9811 Atelectasis: Secondary | ICD-10-CM | POA: Diagnosis not present

## 2021-06-09 DIAGNOSIS — N1831 Chronic kidney disease, stage 3a: Secondary | ICD-10-CM | POA: Diagnosis present

## 2021-06-09 DIAGNOSIS — Z7951 Long term (current) use of inhaled steroids: Secondary | ICD-10-CM | POA: Diagnosis not present

## 2021-06-09 DIAGNOSIS — E875 Hyperkalemia: Secondary | ICD-10-CM | POA: Diagnosis not present

## 2021-06-09 DIAGNOSIS — Z79890 Hormone replacement therapy (postmenopausal): Secondary | ICD-10-CM

## 2021-06-09 DIAGNOSIS — I469 Cardiac arrest, cause unspecified: Secondary | ICD-10-CM | POA: Diagnosis not present

## 2021-06-09 DIAGNOSIS — J95859 Other complication of respirator [ventilator]: Secondary | ICD-10-CM

## 2021-06-09 DIAGNOSIS — N179 Acute kidney failure, unspecified: Secondary | ICD-10-CM | POA: Diagnosis not present

## 2021-06-09 DIAGNOSIS — Z930 Tracheostomy status: Secondary | ICD-10-CM | POA: Diagnosis not present

## 2021-06-09 DIAGNOSIS — R1084 Generalized abdominal pain: Secondary | ICD-10-CM | POA: Diagnosis not present

## 2021-06-09 DIAGNOSIS — E162 Hypoglycemia, unspecified: Secondary | ICD-10-CM | POA: Diagnosis not present

## 2021-06-09 DIAGNOSIS — G40909 Epilepsy, unspecified, not intractable, without status epilepticus: Secondary | ICD-10-CM | POA: Diagnosis present

## 2021-06-09 DIAGNOSIS — I444 Left anterior fascicular block: Secondary | ICD-10-CM | POA: Diagnosis present

## 2021-06-09 DIAGNOSIS — I129 Hypertensive chronic kidney disease with stage 1 through stage 4 chronic kidney disease, or unspecified chronic kidney disease: Secondary | ICD-10-CM | POA: Diagnosis present

## 2021-06-09 DIAGNOSIS — T4275XA Adverse effect of unspecified antiepileptic and sedative-hypnotic drugs, initial encounter: Secondary | ICD-10-CM | POA: Diagnosis present

## 2021-06-09 DIAGNOSIS — I10 Essential (primary) hypertension: Secondary | ICD-10-CM | POA: Diagnosis not present

## 2021-06-09 DIAGNOSIS — R569 Unspecified convulsions: Secondary | ICD-10-CM | POA: Diagnosis not present

## 2021-06-09 DIAGNOSIS — R0600 Dyspnea, unspecified: Secondary | ICD-10-CM

## 2021-06-09 DIAGNOSIS — Z743 Need for continuous supervision: Secondary | ICD-10-CM | POA: Diagnosis not present

## 2021-06-09 DIAGNOSIS — R0789 Other chest pain: Secondary | ICD-10-CM | POA: Diagnosis not present

## 2021-06-09 DIAGNOSIS — J9600 Acute respiratory failure, unspecified whether with hypoxia or hypercapnia: Secondary | ICD-10-CM

## 2021-06-09 DIAGNOSIS — G928 Other toxic encephalopathy: Secondary | ICD-10-CM | POA: Diagnosis not present

## 2021-06-09 DIAGNOSIS — Z20822 Contact with and (suspected) exposure to covid-19: Secondary | ICD-10-CM | POA: Diagnosis not present

## 2021-06-09 DIAGNOSIS — Z4659 Encounter for fitting and adjustment of other gastrointestinal appliance and device: Secondary | ICD-10-CM

## 2021-06-09 DIAGNOSIS — I491 Atrial premature depolarization: Secondary | ICD-10-CM | POA: Diagnosis not present

## 2021-06-09 DIAGNOSIS — J151 Pneumonia due to Pseudomonas: Secondary | ICD-10-CM

## 2021-06-09 DIAGNOSIS — J9611 Chronic respiratory failure with hypoxia: Secondary | ICD-10-CM | POA: Diagnosis present

## 2021-06-09 MED ORDER — MORPHINE SULFATE (PF) 4 MG/ML IV SOLN
4.0000 mg | Freq: Once | INTRAVENOUS | Status: AC
  Administered 2021-06-09: 4 mg via INTRAVENOUS
  Filled 2021-06-09: qty 1

## 2021-06-09 NOTE — ED Notes (Signed)
[facility] called  

## 2021-06-09 NOTE — Discharge Instructions (Signed)
Please follow up with your surgeon regarding your wound. Frequent dressing changes at home for the time being.

## 2021-06-09 NOTE — ED Provider Notes (Signed)
[place] EMERGENCY DEPARTMENT  Provider Note  CSN: [id number] Arrival date & time: 06/08/2021 1054  History Chief Complaint  Patient presents with   Wound Check   Abdominal Pain    Kathleen Cameron is a 56 y.o. female who presents today with concern for abdominal pain.  Patient has a nonhealing G-tube site.  She has had stomach contents draining from it.  She states the pain is severe and burning in nature.  It is constant.  Worsens whenever there is stomach acid coming out.  She was seen in the emergency room yesterday but returned today with ongoing pain.   Home Medications Prior to Admission medications   Medication Sig Start Date End Date Taking? Authorizing Provider  acetaminophen (TYLENOL) 325 MG tablet Take 325 mg by mouth every 6 (six) hours as needed for moderate pain or headache.    [provider]  amLODipine (NORVASC) 10 MG tablet Place 1 tablet (10 mg total) into feeding tube daily. Patient not taking: Reported on 06/10/2021 09/18/20   Mercy Riding, MD  amLODipine (NORVASC) 5 MG tablet Take 5 mg by mouth daily. 05/21/21   [provider]  arformoterol (BROVANA) 15 MCG/2ML NEBU INHALE 2 MLS(1 VIAL) VIA NEBULIZATION TWICE DAILY Patient taking differently: Take 15 mcg by nebulization in the morning and at bedtime. 11/16/20   Collene Gobble, MD  atorvastatin (LIPITOR) 40 MG tablet Take 1 tablet (40 mg total) by mouth at bedtime. Patient not taking: Reported on 06/10/2021 02/10/21 05/11/21  Horald Pollen, MD  azithromycin (ZITHROMAX) 250 MG tablet Take 1 tablet by mouth once a day Patient taking differently: Take 250 mg by mouth daily. Continuously 05/24/21   Collene Gobble, MD  bisacodyl (DULCOLAX) 10 MG suppository Place 1 suppository (10 mg total) rectally daily as needed for severe constipation. 07/10/20   Horald Pollen, MD  budesonide (PULMICORT) 0.25 MG/2ML nebulizer solution Take 2 mLs (0.25 mg total) by nebulization 2  (two) times daily. Patient not taking: Reported on 06/10/2021 01/25/21   Collene Gobble, MD  budesonide (PULMICORT) 0.5 MG/2ML nebulizer solution Take 0.5 mg by nebulization in the morning and at bedtime. 05/24/21   [provider]  Chlorhexidine Gluconate Cloth 2 % PADS Apply 6 each topically daily. Patient not taking: Reported on 06/10/2021 08/21/20   Horald Pollen, MD  chlorhexidine gluconate, MEDLINE KIT, (PERIDEX) 0.12 % solution 15 mLs by Mouth Rinse route 2 (two) times daily. 02/07/21   Horald Pollen, MD  clonazePAM (KLONOPIN) 1 MG tablet PLACE 1/2 TABLET INTO FEEDING TUBE THREE TIMES DAILY AS NEEDED FOR ANXIETY Patient taking differently: Take 0.5 mg by mouth 3 (three) times daily as needed for anxiety. 12/06/20   Horald Pollen, MD  clotrimazole-betamethasone (LOTRISONE) cream Apply 1 application topically 2 (two) times daily. Patient taking differently: Apply 1 application topically daily. 01/10/21   Horald Pollen, MD  doxycycline (MONODOX) 100 MG capsule Take 1 capsule (100 mg total) by mouth 2 (two) times daily. Patient not taking: Reported on 06/10/2021 11/15/20   Binnie Rail, MD  ELIQUIS 5 MG TABS tablet PLACE 1 TABLET INTO FEEDING TUBE TWICE DAILY Patient not taking: Reported on 06/10/2021 12/17/20   Horald Pollen, MD  hydrOXYzine (ATARAX) 25 MG tablet TAKE 1 TABLET(25 MG) BY MOUTH AT BEDTIME AS NEEDED Patient taking differently: Take 25 mg by mouth at bedtime as needed for anxiety (sleep). 05/20/21   Horald Pollen, MD  ipratropium-albuterol (DUONEB)  0.5-2.5 (3) MG/3ML SOLN INHALE CONTENT OF 1 VIAL PER NEBULIZER EVERY 4 HOURS Patient taking differently: Take 3 mLs by nebulization every 4 (four) hours as needed (shortness of breath). 02/12/21   Collene Gobble, MD  levothyroxine (SYNTHROID) 88 MCG tablet TAKE 1 TABLET BY MOUTH EVERY DAY AT 6 AM Patient taking differently: Take 88 mcg by mouth daily before breakfast. 04/19/21   Horald Pollen, MD  lip balm (CARMEX) ointment Apply topically as needed for lip care. Patient taking differently: Apply 1 application topically as needed for lip care. 03/08/20   Candee Furbish, MD  metoCLOPramide (REGLAN) 10 MG tablet Take 1 tablet (10 mg total) by mouth every 8 (eight) hours as needed for nausea. 04/10/21 07/09/21  Horald Pollen, MD  mupirocin ointment (BACTROBAN) 2 % Apply 1 application topically 2 (two) times daily. 04/10/21   Horald Pollen, MD  Nutritional Supplements (ENSURE ACTIVE HIGH PROTEIN) LIQD Take 1 Can by mouth in the morning and at bedtime. 07/09/20   Barb Merino, MD  Nutritional Supplements (FEEDING SUPPLEMENT, OSMOLITE 1.2 CAL,) LIQD Place 1,000 mLs into feeding tube continuous. Patient not taking: Reported on 06/10/2021 07/10/20   Horald Pollen, MD  pantoprazole (PROTONIX) 40 MG tablet TAKE 1 TABLET(40 MG) BY MOUTH DAILY Patient taking differently: Take 40 mg by mouth daily. 05/13/21   Horald Pollen, MD  phenol (CHLORASEPTIC) 1.4 % LIQD Use as directed 1 spray in the mouth or throat as needed for throat irritation / pain. 03/08/20   Candee Furbish, MD  polyethylene glycol (MIRALAX / GLYCOLAX) 17 g packet Place 17 g into feeding tube daily as needed for mild constipation. 03/08/20   Candee Furbish, MD  revefenacin (YUPELRI) 175 MCG/3ML nebulizer solution INHALE 3 MLS(1 VIAL) VIA NEBULIZATION ONCE DAILY Patient taking differently: Take 175 mcg by nebulization daily. 03/29/21   Collene Gobble, MD  rosuvastatin (CRESTOR) 20 MG tablet Take 20 mg by mouth at bedtime. 05/14/21   [provider]  senna-docusate (SENOKOT-S) 8.6-50 MG tablet Place 2 tablets into feeding tube at bedtime as needed for moderate constipation. Patient taking differently: Take 2 tablets by mouth at bedtime as needed for moderate constipation. 11/27/20   McDonald, Mia A, PA-C  simethicone (MYLICON) 40 IF/0.2DX drops Place 0.6 mLs (40 mg total) into feeding tube  every 6 (six) hours. Patient not taking: Reported on 06/10/2021 09/18/20   Mercy Riding, MD  Simethicone 125 MG CAPS Give 125 mg by tube daily.    [provider]  sodium chloride (OCEAN) 0.65 % SOLN nasal spray Place 1 spray into both nostrils as needed for congestion. 03/08/20   Candee Furbish, MD  sorbitol 70 % SOLN Place 30 mLs into feeding tube daily as needed for moderate constipation. Patient taking differently: Take 30 mLs by mouth daily as needed for moderate constipation. 07/25/20   Candee Furbish, MD  temazepam (RESTORIL) 15 MG capsule Place 1 capsule (15 mg total) into feeding tube at bedtime as needed for sleep. Patient not taking: Reported on 06/10/2021 10/29/20   Horald Pollen, MD  traMADol (ULTRAM) 50 MG tablet TAKE 1 TABLET(50 MG) BY MOUTH THREE TIMES DAILY AS NEEDED Patient taking differently: Take 50 mg by mouth 3 (three) times daily as needed for moderate pain. 05/23/21   Horald Pollen, MD  venlafaxine (EFFEXOR) 37.5 MG tablet PLACE 1 TABLET INTO FEEDING TUBE 2 TIMES A DAY WITH A MEAL Patient taking differently: Take 37.5 mg  by mouth 2 (two) times daily with a meal. 01/13/21   Sagardia, Ines Bloomer, MD     Allergies    Stiolto respimat [tiotropium bromide-olodaterol]   Review of Systems   Review of Systems  Constitutional:  Negative for chills and fever.  HENT:  Negative for ear pain and sore throat.   Eyes:  Negative for pain and visual disturbance.  Respiratory:  Negative for cough and shortness of breath.   Cardiovascular:  Negative for chest pain and palpitations.  Gastrointestinal:  Positive for abdominal pain. Negative for vomiting.  Genitourinary:  Negative for dysuria and hematuria.  Musculoskeletal:  Negative for arthralgias and back pain.  Skin:  Negative for color change and rash.  Neurological:  Negative for seizures and syncope.  All other systems reviewed and are negative. Please see HPI for pertinent positives and  negatives  Physical Exam BP 118/85    Pulse 80    Temp 98.2 F (36.8 C) (Oral)    Resp 16    Ht 5' 5" (1.651 m)    Wt 53 kg    SpO2 100%    BMI 19.44 kg/m   Physical Exam Vitals and nursing note reviewed.  Constitutional:      General: She is not in acute distress.    Comments: Trach present  HENT:     Head: Normocephalic and atraumatic.  Eyes:     Conjunctiva/sclera: Conjunctivae normal.  Cardiovascular:     Rate and Rhythm: Normal rate and regular rhythm.     Heart sounds: No murmur heard. Pulmonary:     Effort: Pulmonary effort is normal. No respiratory distress.     Breath sounds: Normal breath sounds.  Abdominal:     Palpations: Abdomen is soft.     Tenderness: There is no abdominal tenderness.     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  There is stomach contents draining.  Musculoskeletal:        General: No swelling.     Cervical back: Neck supple.  Skin:    General: Skin is warm and dry.     Capillary Refill: Capillary refill takes less than 2 seconds.  Neurological:     Mental Status: She is alert.  Psychiatric:        Mood and Affect: Mood normal.    ED Results / Procedures / Treatments   EKG None  Procedures Procedures  Medications Ordered in the ED Medications  morphine (PF) 4 MG/ML injection 4 mg (4 mg Intravenous Given 05/22/2021 1318)    Initial Impression and Plan Patient here after being seen yesterday.  She had a normal work-up with normal labs and a reassuring CT scan.  Ongoing pain likely secondary to the stomach contents draining causing irritation of her skin.   MDM Rules/Calculators/A&P Medical Decision Making Risk Prescription drug management.   Patient was given 1 dose of pain medicine here.  Lengthy discussion had with family outside of the room who voiced concern that patient was pain seeking.  She had been asking for treatments at home.  I discussed with patient and the family that patient needs  to follow-up with her surgeon regarding this wound site.  It is possible that she has a known healing fistula that they may need to intervene on.  They voiced understanding and agreement with this plan.  We discussed that all of her laboratory studies and imaging were negative yesterday.  I do not feel that she needs these images repeated  today.  Her vital signs are stable within normal limits.  She has the same symptoms that she did yesterday.  Patient feels much better after pain medicine.  Plan for discharge home.  Patient seen in conjunction with my attending, Dr. Karle Starch   Final Clinical Impression(s) / ED Diagnoses Final diagnoses:  Abdominal pain, unspecified abdominal location    Rx / DC Orders ED Discharge Orders     None         Jacelyn Pi, MD 06/10/21 [id number]    Truddie Hidden, MD 06/10/21 1537

## 2021-06-09 NOTE — ED Triage Notes (Signed)
Pt BIB [facility] for wound check/abdominal pain. Per EMS pt here yesterday for similar, states that pt had g tube removed "several months ago" and reporting burning pain and discharge from old g tube site. Dark brown drainage noted on dressing. Pt with baseline trach and vent. Pt arrives with portable vent from home. RT present on arrival and switches pt to hospital ventilator equipment.

## 2021-06-09 NOTE — ED Notes (Signed)
Pt verbalized understanding of d/c instructions, meds and followup care. Denies questions. VSS, no distress noted. Stretcher to exit with all belongings via [facility].

## 2021-06-09 NOTE — ED Triage Notes (Signed)
Pt biba from home with complaints of chest pain that started after she was discharged from the ER at 1500 today. Pt went home and pain began. Pt on trach at baseline, no medications given pta

## 2021-06-09 NOTE — ED Notes (Signed)
[facility] contacted to transport patient home. 

## 2021-06-09 NOTE — ED Provider Notes (Signed)
[facility] EMERGENCY DEPARTMENT Provider Note   CSN: [id number] Arrival date & time: 06/08/2021  2216     History  Chief Complaint  Patient presents with   Chest Pain    Started after her discharge from ER today at [name] is a 56 y.o. female.  The history is provided by the patient and medical records.  Chest Pain Kathleen Cameron is a 56 y.o. female who presents to the Emergency Department complaining of chest pain.  She presents to the ED by EMS for evaluation of chest pain.  She has a history of severe COPD and is on home ventilator via tracheostomy.  On Friday she had her ventilator settings decreased from tidal volume of 480 to 400 cc.  Today she reported having chest pain and requested transfer to the hospital.  Yesterday she was complaining of abdominal pain.  Husband is concerned that there is some component of anxiety that is contributing to her symptoms.     Home Medications Prior to Admission medications   Medication Sig Start Date End Date Taking? Authorizing Provider  acetaminophen (TYLENOL) 325 MG tablet Take 325 mg by mouth every 6 (six) hours as needed for moderate pain or headache.   Yes [provider]  amLODipine (NORVASC) 5 MG tablet Take 5 mg by mouth daily. 05/21/21  Yes [provider]  arformoterol (BROVANA) 15 MCG/2ML NEBU INHALE 2 MLS(1 VIAL) VIA NEBULIZATION TWICE DAILY Patient taking differently: Take 15 mcg by nebulization in the morning and at bedtime. 11/16/20  Yes Collene Gobble, MD  azithromycin (ZITHROMAX) 250 MG tablet Take 1 tablet by mouth once a day Patient taking differently: Take 250 mg by mouth daily. Continuously 05/24/21  Yes Collene Gobble, MD  bisacodyl (DULCOLAX) 10 MG suppository Place 1 suppository (10 mg total) rectally daily as needed for severe constipation. 07/10/20  Yes Sagardia, Ines Bloomer, MD  budesonide (PULMICORT) 0.5 MG/2ML nebulizer solution Take 0.5 mg by  nebulization in the morning and at bedtime. 05/24/21  Yes [provider]  chlorhexidine gluconate, MEDLINE KIT, (PERIDEX) 0.12 % solution 15 mLs by Mouth Rinse route 2 (two) times daily. 02/07/21  Yes Sagardia, Ines Bloomer, MD  clonazePAM (KLONOPIN) 1 MG tablet PLACE 1/2 TABLET INTO FEEDING TUBE THREE TIMES DAILY AS NEEDED FOR ANXIETY Patient taking differently: Take 0.5 mg by mouth 3 (three) times daily as needed for anxiety. 12/06/20  Yes Sagardia, Ines Bloomer, MD  clotrimazole-betamethasone (LOTRISONE) cream Apply 1 application topically 2 (two) times daily. Patient taking differently: Apply 1 application topically daily. 01/10/21  Yes Sagardia, Ines Bloomer, MD  hydrOXYzine (ATARAX) 25 MG tablet TAKE 1 TABLET(25 MG) BY MOUTH AT BEDTIME AS NEEDED Patient taking differently: Take 25 mg by mouth at bedtime as needed for anxiety (sleep). 05/20/21  Yes Sagardia, Ines Bloomer, MD  ipratropium-albuterol (DUONEB) 0.5-2.5 (3) MG/3ML SOLN INHALE CONTENT OF 1 VIAL PER NEBULIZER EVERY 4 HOURS Patient taking differently: Take 3 mLs by nebulization every 4 (four) hours as needed (shortness of breath). 02/12/21  Yes Collene Gobble, MD  levothyroxine (SYNTHROID) 88 MCG tablet TAKE 1 TABLET BY MOUTH EVERY DAY AT 6 AM Patient taking differently: Take 88 mcg by mouth daily before breakfast. 04/19/21  Yes Sagardia, Ines Bloomer, MD  lip balm (CARMEX) ointment Apply topically as needed for lip care. Patient taking differently: Apply 1 application topically as needed for lip care. 03/08/20  Yes Candee Furbish, MD  metoCLOPramide (REGLAN) 10 MG tablet  Take 1 tablet (10 mg total) by mouth every 8 (eight) hours as needed for nausea. 04/10/21 07/09/21 Yes Sagardia, Ines Bloomer, MD  mupirocin ointment (BACTROBAN) 2 % Apply 1 application topically 2 (two) times daily. 04/10/21  Yes Sagardia, Ines Bloomer, MD  Nutritional Supplements (ENSURE ACTIVE HIGH PROTEIN) LIQD Take 1 Can by mouth in the morning and at bedtime.  07/09/20  Yes Ghimire, Dante Gang, MD  pantoprazole (PROTONIX) 40 MG tablet TAKE 1 TABLET(40 MG) BY MOUTH DAILY Patient taking differently: Take 40 mg by mouth daily. 05/13/21  Yes Sagardia, Ines Bloomer, MD  phenol (CHLORASEPTIC) 1.4 % LIQD Use as directed 1 spray in the mouth or throat as needed for throat irritation / pain. 03/08/20  Yes Candee Furbish, MD  polyethylene glycol (MIRALAX / GLYCOLAX) 17 g packet Place 17 g into feeding tube daily as needed for mild constipation. 03/08/20  Yes Candee Furbish, MD  revefenacin [facility]) 175 MCG/3ML nebulizer solution INHALE 3 MLS(1 VIAL) VIA NEBULIZATION ONCE DAILY Patient taking differently: Take 175 mcg by nebulization daily. 03/29/21  Yes Collene Gobble, MD  rosuvastatin (CRESTOR) 20 MG tablet Take 20 mg by mouth at bedtime. 05/14/21  Yes [provider]  senna-docusate (SENOKOT-S) 8.6-50 MG tablet Place 2 tablets into feeding tube at bedtime as needed for moderate constipation. Patient taking differently: Take 2 tablets by mouth at bedtime as needed for moderate constipation. 11/27/20  Yes McDonald, Mia A, PA-C  Simethicone 125 MG CAPS Give 125 mg by tube daily.   Yes [provider]  sodium chloride (OCEAN) 0.65 % SOLN nasal spray Place 1 spray into both nostrils as needed for congestion. 03/08/20  Yes Candee Furbish, MD  sorbitol 70 % SOLN Place 30 mLs into feeding tube daily as needed for moderate constipation. Patient taking differently: Take 30 mLs by mouth daily as needed for moderate constipation. 07/25/20  Yes Candee Furbish, MD  traMADol (ULTRAM) 50 MG tablet TAKE 1 TABLET(50 MG) BY MOUTH THREE TIMES DAILY AS NEEDED Patient taking differently: Take 50 mg by mouth 3 (three) times daily as needed for moderate pain. 05/23/21  Yes Horald Pollen, MD  venlafaxine (EFFEXOR) 37.5 MG tablet PLACE 1 TABLET INTO FEEDING TUBE 2 TIMES A DAY WITH A MEAL Patient taking differently: Take 37.5 mg by mouth 2 (two) times daily with a meal.  01/13/21  Yes Sagardia, Ines Bloomer, MD  amLODipine (NORVASC) 10 MG tablet Place 1 tablet (10 mg total) into feeding tube daily. Patient not taking: Reported on 06/10/2021 09/18/20   Mercy Riding, MD  atorvastatin (LIPITOR) 40 MG tablet Take 1 tablet (40 mg total) by mouth at bedtime. Patient not taking: Reported on 06/10/2021 02/10/21 05/11/21  Horald Pollen, MD  budesonide (PULMICORT) 0.25 MG/2ML nebulizer solution Take 2 mLs (0.25 mg total) by nebulization 2 (two) times daily. Patient not taking: Reported on 06/10/2021 01/25/21   Collene Gobble, MD  Chlorhexidine Gluconate Cloth 2 % PADS Apply 6 each topically daily. Patient not taking: Reported on 06/10/2021 08/21/20   Horald Pollen, MD  doxycycline (MONODOX) 100 MG capsule Take 1 capsule (100 mg total) by mouth 2 (two) times daily. Patient not taking: Reported on 06/10/2021 11/15/20   Binnie Rail, MD  ELIQUIS 5 MG TABS tablet PLACE 1 TABLET INTO FEEDING TUBE TWICE DAILY Patient not taking: Reported on 06/10/2021 12/17/20   Horald Pollen, MD  Nutritional Supplements (FEEDING SUPPLEMENT, OSMOLITE 1.2 CAL,) LIQD Place 1,000 mLs into feeding tube  continuous. Patient not taking: Reported on 06/10/2021 07/10/20   Horald Pollen, MD  simethicone [facility]) 40 AY/3.0ZS drops Place 0.6 mLs (40 mg total) into feeding tube every 6 (six) hours. Patient not taking: Reported on 06/10/2021 09/18/20   Mercy Riding, MD  temazepam (RESTORIL) 15 MG capsule Place 1 capsule (15 mg total) into feeding tube at bedtime as needed for sleep. Patient not taking: Reported on 06/10/2021 10/29/20   Horald Pollen, MD      Allergies    Stiolto respimat [tiotropium bromide-olodaterol]    Review of Systems   Review of Systems  Cardiovascular:  Positive for chest pain.  All other systems reviewed and are negative.  Physical Exam Updated Vital Signs BP (!) 125/97    Pulse 76    Temp 98 F (36.7 C) (Oral)    Resp 16    SpO2 99%  Physical  Exam Vitals and nursing note reviewed.  Constitutional:      Appearance: She is well-developed.  HENT:     Head: Normocephalic and atraumatic.  Cardiovascular:     Rate and Rhythm: Normal rate and regular rhythm.     Heart sounds: No murmur heard. Pulmonary:     Effort: Pulmonary effort is normal. No respiratory distress.     Breath sounds: Normal breath sounds.     Comments: There is a tracheostomy in the anterior neck with dressing in place that is clean and dry. Abdominal:     Palpations: Abdomen is soft.     Tenderness: There is no guarding or rebound.     Comments: Mild abdominal distention.  There is a dressing to the left upper quadrant that is clean, dry, intact.  Musculoskeletal:        General: No swelling or tenderness.  Skin:    General: Skin is warm and dry.  Neurological:     Mental Status: She is alert and oriented to person, place, and time.  Psychiatric:        Behavior: Behavior normal.    ED Results / Procedures / Treatments   Labs (all labs ordered are listed, but only abnormal results are displayed) Labs Reviewed  CBC WITH DIFFERENTIAL/PLATELET - Abnormal; Notable for the following components:      Result Value   Hemoglobin 11.3 (*)    HCT 35.8 (*)    All other components within normal limits  COMPREHENSIVE METABOLIC PANEL - Abnormal; Notable for the following components:   Sodium 134 (*)    Chloride 90 (*)    CO2 33 (*)    Total Protein 8.3 (*)    Alkaline Phosphatase 138 (*)    All other components within normal limits  D-DIMER, QUANTITATIVE (NOT AT [facility]) - Abnormal; Notable for the following components:   D-Dimer, Quant 0.86 (*)    All other components within normal limits  I-STAT VENOUS BLOOD GAS, ED - Abnormal; Notable for the following components:   pH, Ven 7.503 (*)    pO2, Ven 160 (*)    Bicarbonate 43.6 (*)    TCO2 45 (*)    Acid-Base Excess 18.0 (*)    All other components within normal limits  TROPONIN I (HIGH SENSITIVITY) -  Abnormal; Notable for the following components:   Troponin I (High Sensitivity) 18 (*)    All other components within normal limits  RESP PANEL BY RT-PCR (FLU A&B, COVID) ARPGX2  LIPASE, BLOOD  TROPONIN I (HIGH SENSITIVITY)    EKG EKG Interpretation  Date/Time:  Sunday  June 09 2021 22:23:43 EST Ventricular Rate:  84 PR Interval:  159 QRS Duration: 83 QT Interval:  418 QTC Calculation: 495 R Axis:   -79 Text Interpretation: Sinus rhythm Biatrial enlargement Left anterior fascicular block Confirmed by Octaviano Glow [phone]) on 06/02/2021 10:29:11 PM  Radiology CT Angio Chest PE W/Cm &/Or Wo Cm  Result Date: 06/10/2021 CLINICAL DATA:  56 year old female with acute chest pain. EXAM: CT ANGIOGRAPHY CHEST WITH CONTRAST TECHNIQUE: Multidetector CT imaging of the chest was performed using the standard protocol during bolus administration of intravenous contrast. Multiplanar CT image reconstructions and MIPs were obtained to evaluate the vascular anatomy. RADIATION DOSE REDUCTION: This exam was performed according to the departmental dose-optimization program which includes automated exposure control, adjustment of the mA and/or kV according to patient size and/or use of iterative reconstruction technique. CONTRAST:  66mL OMNIPAQUE IOHEXOL 350 MG/ML SOLN COMPARISON:  CT Chest, Abdomen, and Pelvis 06/08/2021, and earlier. FINDINGS: Cardiovascular: Excellent contrast bolus timing in the pulmonary arterial tree. No focal filling defect identified in the pulmonary arteries to suggest acute pulmonary embolism. Stable mild tortuosity of the thoracic aorta. No cardiomegaly or pericardial effusion. Little contrast in the descending aorta. Calcified coronary artery atherosclerosis. Mediastinum/Nodes: Widespread calcified postinflammatory lymph nodes, chronically increased hilar nodal tissue. No interval change. Lungs/Pleura: Stable tracheostomy. Small volume retained secretions in the trachea around the  tube. Distal trachea and carina remain patent although there is partial collapse of the left mainstem bronchus on series 10, image 63. And similar additional severe narrowing of the left lower lobe bronchus on series 10, image 80. And right lower lobe subsegmental and distal airways also appear newly opacified. Underlying chronic lung disease with advanced emphysema. Multiple peripheral partially calcified pulmonary nodules appears stable and postinflammatory. Increased bilateral lower lobe peribronchial streaky, which appears to represent a combination of atelectasis and inflammation. No pleural effusion. No pneumothorax. Upper Abdomen: Stable, negative visible upper abdomen. Musculoskeletal: No acute osseous abnormality identified. Review of the MIP images confirms the above findings. IMPRESSION: 1. No evidence of acute pulmonary embolus. 2. Pronounced chronic Emphysema (ICD10-J43.9) and chronic post granulomatous changes in the chest. Chronic tracheostomy. Trachea is patent but there is significant acute airway narrowing and opacification of the left mainstem bronchus, left lower lobar bronchus, and additional bilateral segmental lower lobe airways. And associated increased combined lower lobe atelectasis and inflammation. No pleural effusion. 3. Calcified coronary artery atherosclerosis. Aortic Atherosclerosis (ICD10-I70.0). Electronically Signed   By: Kathleen Cameron M.D.   On: 06/10/2021 04:36   CT CHEST ABDOMEN PELVIS W CONTRAST  Result Date: 06/08/2021 CLINICAL DATA:  56 year old female with abdominal and pelvic distension and sepsis. EXAM: CT CHEST, ABDOMEN, AND PELVIS WITH CONTRAST TECHNIQUE: Multidetector CT imaging of the chest, abdomen and pelvis was performed following the standard protocol during bolus administration of intravenous contrast. RADIATION DOSE REDUCTION: This exam was performed according to the departmental dose-optimization program which includes automated exposure control, adjustment of the  mA and/or kV according to patient size and/or use of iterative reconstruction technique. CONTRAST:  181mL OMNIPAQUE IOHEXOL 300 MG/ML  SOLN COMPARISON:  12/09/2020 chest CT, 11/27/2020 abdomen/pelvic CT and prior studies FINDINGS: CT CHEST FINDINGS Cardiovascular: Heart size is normal. Suggestion of LEFT ventricular hypertrophy identified. Coronary artery calcifications again identified. There is no evidence of thoracic aortic aneurysm or pericardial effusion. Mediastinum/Nodes: Calcified mediastinal and hilar lymph nodes are again noted compatible with prior granulomatous disease. No suspicious nodes are identified. No mediastinal mass noted. Tracheostomy tube is present. The visualized thyroid  is unremarkable. Lungs/Pleura: Advanced emphysema again noted. Dependent/basilar atelectasis/scarring again noted. Calcified granulomas subpleural nodules are unchanged. Bilateral LOWER lung cylindrical bronchiectasis again identified. There is no evidence of airspace disease for consolidation. Musculoskeletal: No acute or suspicious bony abnormalities are noted. CT ABDOMEN PELVIS FINDINGS Hepatobiliary: The liver and gallbladder are unremarkable. There is no evidence of intrahepatic or extrahepatic biliary dilatation. Pancreas: Unremarkable Spleen: Unremarkable Adrenals/Urinary Tract: The kidneys are unchanged with bilateral renal cortical thinning. No acute renal abnormalities are identified. No significant adrenal or bladder abnormalities noted. Stomach/Bowel: Stomach is within normal limits. Appendix appears normal. No evidence of bowel obstruction, bowel wall thickening, distention, or inflammatory changes. Vascular/Lymphatic: Aortic atherosclerosis. No enlarged abdominal or pelvic lymph nodes. Reproductive: Uterus and bilateral adnexa are unremarkable. Other: Small amount of free pelvic fluid is again identified. No focal collection or pneumoperitoneum noted. Musculoskeletal: No acute or suspicious bony abnormalities  are noted. IMPRESSION: 1. No evidence of new/acute abnormality within the chest, abdomen or pelvis. 2. Mild bilateral LOWER lung cylindrical bronchiectasis with mild bibasilar atelectasis/scarring. 3. Small amount of free pelvic fluid, unchanged. 4. Coronary artery disease. 5. Aortic Atherosclerosis (ICD10-I70.0) and Emphysema (ICD10-J43.9). Electronically Signed   By: Kathleen Cameron M.D.   On: 06/08/2021 21:52   DG Chest Port 1 View  Result Date: 05/25/2021 CLINICAL DATA:  Chest pain. Pain onset after being discharged from the emergency room today. EXAM: PORTABLE CHEST 1 VIEW COMPARISON:  Radiograph and CT yesterday. FINDINGS: Tracheostomy tube tip at the thoracic inlet. Emphysema. There is increased hyperinflation from prior exam. Basilar bullous disease again seen. No pneumothorax, pulmonary edema, or acute airspace disease. Calcified granuloma again seen in the right upper lung. IMPRESSION: Chronic but increased hyperinflation since yesterday. No other new findings. Advanced emphysema. Electronically Signed   By: Kathleen Cameron M.D.   On: 06/06/2021 23:53   DG Chest Portable 1 View  Result Date: 06/08/2021 CLINICAL DATA:  Shortness of breath. EXAM: PORTABLE CHEST 1 VIEW COMPARISON:  Radiograph and CT 12/09/2020 FINDINGS: Tracheostomy tube tip projects over the thoracic inlet. Chronic emphysema with bullous disease at the right lung base. Slight increase in interstitial thickening. Bandlike atelectasis or scarring in the right mid lung zone and left lower lobe. Calcified mediastinal and hilar adenopathy with right upper lobe calcified granulomas. The heart is normal in size with stable mediastinal contours. Multiple EKG leads overlie the chest. No pneumothorax or large pleural effusion. IMPRESSION: 1. Slight increase in interstitial thickening may be pulmonary edema or atypical infection. 2. Chronic emphysema with bullous disease at the right lung base. Bandlike atelectasis or scarring in the right mid  lung zone and left lower lobe. Electronically Signed   By: Kathleen Cameron M.D.   On: 06/08/2021 19:02    Procedures Procedures    Medications Ordered in ED Medications  iohexol (OMNIPAQUE) 350 MG/ML injection 50 mL (50 mLs Intravenous Contrast Given 06/10/21 0418)    ED Course/ Medical Decision Making/ A&P                           Medical Decision Making Amount and/or Complexity of Data Reviewed Labs: ordered. Radiology: ordered.  Risk Prescription drug management. Decision regarding hospitalization.   Patient with tracheostomy secondary to severe COPD here for evaluation of chest pain.  She had 2 ED visits yesterday for evaluation of abdominal pain with CT chest abdomen pelvis performed at that time that was negative.  EKG is without acute ischemic changes.  Troponins were  obtained, initial troponin was minimally elevated, repeat is downtrending.  Current clinical picture is not consistent with ACS, dissection.  Given her comorbidities a D-dimer was obtained, which was elevated.  A CTA PE protocol was obtained, which was negative for PE or pneumonia but is concerning for narrowing of her left mainstem bronchus.  On assessment after imaging patient with no respiratory distress, good air movement bilaterally.  Discussed case with PCCM-patient will be seen in consult.        Final Clinical Impression(s) / ED Diagnoses Final diagnoses:  None    Rx / DC Orders ED Discharge Orders     None         Quintella Reichert, MD 06/10/21 [phone]

## 2021-06-10 ENCOUNTER — Inpatient Hospital Stay (HOSPITAL_COMMUNITY): Payer: Medicare Other

## 2021-06-10 ENCOUNTER — Emergency Department (HOSPITAL_COMMUNITY): Payer: Medicare Other

## 2021-06-10 DIAGNOSIS — E039 Hypothyroidism, unspecified: Secondary | ICD-10-CM | POA: Diagnosis not present

## 2021-06-10 DIAGNOSIS — Z66 Do not resuscitate: Secondary | ICD-10-CM | POA: Diagnosis not present

## 2021-06-10 DIAGNOSIS — E872 Acidosis, unspecified: Secondary | ICD-10-CM | POA: Diagnosis not present

## 2021-06-10 DIAGNOSIS — R6521 Severe sepsis with septic shock: Secondary | ICD-10-CM | POA: Diagnosis not present

## 2021-06-10 DIAGNOSIS — R0602 Shortness of breath: Secondary | ICD-10-CM | POA: Diagnosis not present

## 2021-06-10 DIAGNOSIS — G928 Other toxic encephalopathy: Secondary | ICD-10-CM | POA: Diagnosis not present

## 2021-06-10 DIAGNOSIS — K567 Ileus, unspecified: Secondary | ICD-10-CM | POA: Diagnosis not present

## 2021-06-10 DIAGNOSIS — R079 Chest pain, unspecified: Secondary | ICD-10-CM | POA: Diagnosis not present

## 2021-06-10 DIAGNOSIS — I129 Hypertensive chronic kidney disease with stage 1 through stage 4 chronic kidney disease, or unspecified chronic kidney disease: Secondary | ICD-10-CM | POA: Diagnosis not present

## 2021-06-10 DIAGNOSIS — Z7951 Long term (current) use of inhaled steroids: Secondary | ICD-10-CM | POA: Diagnosis not present

## 2021-06-10 DIAGNOSIS — D6489 Other specified anemias: Secondary | ICD-10-CM | POA: Diagnosis present

## 2021-06-10 DIAGNOSIS — J95859 Other complication of respirator [ventilator]: Secondary | ICD-10-CM | POA: Diagnosis not present

## 2021-06-10 DIAGNOSIS — J439 Emphysema, unspecified: Secondary | ICD-10-CM | POA: Diagnosis not present

## 2021-06-10 DIAGNOSIS — N1831 Chronic kidney disease, stage 3a: Secondary | ICD-10-CM | POA: Diagnosis present

## 2021-06-10 DIAGNOSIS — Z9911 Dependence on respirator [ventilator] status: Secondary | ICD-10-CM | POA: Diagnosis not present

## 2021-06-10 DIAGNOSIS — N179 Acute kidney failure, unspecified: Secondary | ICD-10-CM | POA: Diagnosis not present

## 2021-06-10 DIAGNOSIS — J398 Other specified diseases of upper respiratory tract: Secondary | ICD-10-CM | POA: Diagnosis not present

## 2021-06-10 DIAGNOSIS — Z79890 Hormone replacement therapy (postmenopausal): Secondary | ICD-10-CM | POA: Diagnosis not present

## 2021-06-10 DIAGNOSIS — R911 Solitary pulmonary nodule: Secondary | ICD-10-CM | POA: Diagnosis not present

## 2021-06-10 DIAGNOSIS — I952 Hypotension due to drugs: Secondary | ICD-10-CM | POA: Diagnosis present

## 2021-06-10 DIAGNOSIS — R188 Other ascites: Secondary | ICD-10-CM | POA: Diagnosis not present

## 2021-06-10 DIAGNOSIS — J9622 Acute and chronic respiratory failure with hypercapnia: Secondary | ICD-10-CM | POA: Diagnosis not present

## 2021-06-10 DIAGNOSIS — R072 Precordial pain: Secondary | ICD-10-CM | POA: Diagnosis not present

## 2021-06-10 DIAGNOSIS — R0603 Acute respiratory distress: Secondary | ICD-10-CM | POA: Diagnosis not present

## 2021-06-10 DIAGNOSIS — Z20822 Contact with and (suspected) exposure to covid-19: Secondary | ICD-10-CM | POA: Diagnosis not present

## 2021-06-10 DIAGNOSIS — Z4682 Encounter for fitting and adjustment of non-vascular catheter: Secondary | ICD-10-CM | POA: Diagnosis not present

## 2021-06-10 DIAGNOSIS — J9811 Atelectasis: Secondary | ICD-10-CM | POA: Diagnosis not present

## 2021-06-10 DIAGNOSIS — J9602 Acute respiratory failure with hypercapnia: Secondary | ICD-10-CM | POA: Diagnosis not present

## 2021-06-10 DIAGNOSIS — R0902 Hypoxemia: Secondary | ICD-10-CM | POA: Diagnosis not present

## 2021-06-10 DIAGNOSIS — N2889 Other specified disorders of kidney and ureter: Secondary | ICD-10-CM | POA: Diagnosis not present

## 2021-06-10 DIAGNOSIS — J9611 Chronic respiratory failure with hypoxia: Secondary | ICD-10-CM | POA: Diagnosis not present

## 2021-06-10 DIAGNOSIS — A419 Sepsis, unspecified organism: Secondary | ICD-10-CM | POA: Diagnosis not present

## 2021-06-10 DIAGNOSIS — Z515 Encounter for palliative care: Secondary | ICD-10-CM | POA: Diagnosis not present

## 2021-06-10 DIAGNOSIS — R0600 Dyspnea, unspecified: Secondary | ICD-10-CM | POA: Diagnosis not present

## 2021-06-10 DIAGNOSIS — J9809 Other diseases of bronchus, not elsewhere classified: Secondary | ICD-10-CM | POA: Diagnosis not present

## 2021-06-10 DIAGNOSIS — J9621 Acute and chronic respiratory failure with hypoxia: Secondary | ICD-10-CM | POA: Diagnosis not present

## 2021-06-10 DIAGNOSIS — J156 Pneumonia due to other aerobic Gram-negative bacteria: Secondary | ICD-10-CM | POA: Diagnosis present

## 2021-06-10 DIAGNOSIS — G40909 Epilepsy, unspecified, not intractable, without status epilepticus: Secondary | ICD-10-CM | POA: Diagnosis present

## 2021-06-10 DIAGNOSIS — J151 Pneumonia due to Pseudomonas: Secondary | ICD-10-CM | POA: Diagnosis not present

## 2021-06-10 DIAGNOSIS — M6281 Muscle weakness (generalized): Secondary | ICD-10-CM | POA: Diagnosis not present

## 2021-06-10 DIAGNOSIS — J441 Chronic obstructive pulmonary disease with (acute) exacerbation: Secondary | ICD-10-CM | POA: Diagnosis not present

## 2021-06-10 DIAGNOSIS — J90 Pleural effusion, not elsewhere classified: Secondary | ICD-10-CM | POA: Diagnosis not present

## 2021-06-10 DIAGNOSIS — J449 Chronic obstructive pulmonary disease, unspecified: Secondary | ICD-10-CM | POA: Diagnosis not present

## 2021-06-10 DIAGNOSIS — I959 Hypotension, unspecified: Secondary | ICD-10-CM | POA: Diagnosis not present

## 2021-06-10 DIAGNOSIS — J9690 Respiratory failure, unspecified, unspecified whether with hypoxia or hypercapnia: Secondary | ICD-10-CM | POA: Diagnosis not present

## 2021-06-10 DIAGNOSIS — Z930 Tracheostomy status: Secondary | ICD-10-CM | POA: Diagnosis not present

## 2021-06-10 DIAGNOSIS — F419 Anxiety disorder, unspecified: Secondary | ICD-10-CM | POA: Diagnosis present

## 2021-06-10 LAB — CBC WITH DIFFERENTIAL/PLATELET
Abs Immature Granulocytes: 0.03 10*3/uL (ref 0.00–0.07)
Basophils Absolute: 0 10*3/uL (ref 0.0–0.1)
Basophils Relative: 1 %
Eosinophils Absolute: 0.1 10*3/uL (ref 0.0–0.5)
Eosinophils Relative: 1 %
HCT: 35.8 % — ABNORMAL LOW (ref 36.0–46.0)
Hemoglobin: 11.3 g/dL — ABNORMAL LOW (ref 12.0–15.0)
Immature Granulocytes: 1 %
Lymphocytes Relative: 17 %
Lymphs Abs: 1.1 10*3/uL (ref 0.7–4.0)
MCH: 27.4 pg (ref 26.0–34.0)
MCHC: 31.6 g/dL (ref 30.0–36.0)
MCV: 86.7 fL (ref 80.0–100.0)
Monocytes Absolute: 0.5 10*3/uL (ref 0.1–1.0)
Monocytes Relative: 8 %
Neutro Abs: 4.8 10*3/uL (ref 1.7–7.7)
Neutrophils Relative %: 72 %
Platelets: 239 10*3/uL (ref 150–400)
RBC: 4.13 MIL/uL (ref 3.87–5.11)
RDW: 15.1 % (ref 11.5–15.5)
WBC: 6.6 10*3/uL (ref 4.0–10.5)
nRBC: 0 % (ref 0.0–0.2)

## 2021-06-10 LAB — I-STAT VENOUS BLOOD GAS, ED
Acid-Base Excess: 18 mmol/L — ABNORMAL HIGH (ref 0.0–2.0)
Bicarbonate: 43.6 mmol/L — ABNORMAL HIGH (ref 20.0–28.0)
Calcium, Ion: 1.2 mmol/L (ref 1.15–1.40)
HCT: 37 % (ref 36.0–46.0)
Hemoglobin: 12.6 g/dL (ref 12.0–15.0)
O2 Saturation: 100 %
Potassium: 4.4 mmol/L (ref 3.5–5.1)
Sodium: 136 mmol/L (ref 135–145)
TCO2: 45 mmol/L — ABNORMAL HIGH (ref 22–32)
pCO2, Ven: 55.6 mmHg (ref 44–60)
pH, Ven: 7.503 — ABNORMAL HIGH (ref 7.25–7.43)
pO2, Ven: 160 mmHg — ABNORMAL HIGH (ref 32–45)

## 2021-06-10 LAB — BASIC METABOLIC PANEL
Anion gap: 10 (ref 5–15)
BUN: 13 mg/dL (ref 6–20)
CO2: 37 mmol/L — ABNORMAL HIGH (ref 22–32)
Calcium: 10.1 mg/dL (ref 8.9–10.3)
Chloride: 93 mmol/L — ABNORMAL LOW (ref 98–111)
Creatinine, Ser: 0.95 mg/dL (ref 0.44–1.00)
GFR, Estimated: >60 mL/min (ref >60)
Glucose, Bld: 153 mg/dL — ABNORMAL HIGH (ref 70–99)
Potassium: 3.8 mmol/L (ref 3.5–5.1)
Sodium: 140 mmol/L (ref 135–145)

## 2021-06-10 LAB — COMPREHENSIVE METABOLIC PANEL
ALT: 17 U/L (ref 0–44)
AST: 41 U/L (ref 15–41)
Albumin: 3.8 g/dL (ref 3.5–5.0)
Alkaline Phosphatase: 138 U/L — ABNORMAL HIGH (ref 38–126)
Anion gap: 11 (ref 5–15)
BUN: 11 mg/dL (ref 6–20)
CO2: 33 mmol/L — ABNORMAL HIGH (ref 22–32)
Calcium: 9.8 mg/dL (ref 8.9–10.3)
Chloride: 90 mmol/L — ABNORMAL LOW (ref 98–111)
Creatinine, Ser: 0.92 mg/dL (ref 0.44–1.00)
GFR, Estimated: >60 mL/min (ref >60)
Glucose, Bld: 98 mg/dL (ref 70–99)
Potassium: 4.2 mmol/L (ref 3.5–5.1)
Sodium: 134 mmol/L — ABNORMAL LOW (ref 135–145)
Total Bilirubin: 0.9 mg/dL (ref 0.3–1.2)
Total Protein: 8.3 g/dL — ABNORMAL HIGH (ref 6.5–8.1)

## 2021-06-10 LAB — RESP PANEL BY RT-PCR (FLU A&B, COVID) ARPGX2
Influenza A by PCR: NEGATIVE
Influenza B by PCR: NEGATIVE
SARS Coronavirus 2 by RT PCR: NEGATIVE

## 2021-06-10 LAB — D-DIMER, QUANTITATIVE: D-Dimer, Quant: 0.86 ug/mL-FEU — ABNORMAL HIGH (ref 0.00–0.50)

## 2021-06-10 LAB — TROPONIN I (HIGH SENSITIVITY)
Troponin I (High Sensitivity): 18 ng/L — ABNORMAL HIGH (ref <18)
Troponin I (High Sensitivity): 14 ng/L (ref <18)
Troponin I (High Sensitivity): 13 ng/L (ref <18)

## 2021-06-10 LAB — MAGNESIUM: Magnesium: 1.9 mg/dL (ref 1.7–2.4)

## 2021-06-10 LAB — HIV ANTIBODY (ROUTINE TESTING W REFLEX): HIV Screen 4th Generation wRfx: NONREACTIVE

## 2021-06-10 LAB — LIPASE, BLOOD: Lipase: 25 U/L (ref 11–51)

## 2021-06-10 MED ORDER — SODIUM CHLORIDE 0.9 % IV SOLN
100.0000 mg | Freq: Two times a day (BID) | INTRAVENOUS | Status: DC
  Administered 2021-06-10 – 2021-06-13 (×7): 100 mg via INTRAVENOUS
  Filled 2021-06-10 (×7): qty 100

## 2021-06-10 MED ORDER — IPRATROPIUM-ALBUTEROL 0.5-2.5 (3) MG/3ML IN SOLN
3.0000 mL | Freq: Four times a day (QID) | RESPIRATORY_TRACT | Status: DC
  Administered 2021-06-10 – 2021-06-11 (×3): 3 mL via RESPIRATORY_TRACT
  Filled 2021-06-10 (×3): qty 3

## 2021-06-10 MED ORDER — DOCUSATE SODIUM 50 MG/5ML PO LIQD
100.0000 mg | Freq: Two times a day (BID) | ORAL | Status: DC
  Administered 2021-06-10: 100 mg
  Filled 2021-06-10: qty 10

## 2021-06-10 MED ORDER — DEXTROSE 5 % IV SOLN
250.0000 mg | INTRAVENOUS | Status: DC
  Administered 2021-06-10 – 2021-06-11 (×2): 250 mg via INTRAVENOUS
  Filled 2021-06-10 (×2): qty 2.5

## 2021-06-10 MED ORDER — FENTANYL CITRATE PF 50 MCG/ML IJ SOSY
50.0000 ug | PREFILLED_SYRINGE | INTRAMUSCULAR | Status: AC | PRN
  Administered 2021-06-10 (×3): 50 ug via INTRAVENOUS
  Filled 2021-06-10 (×3): qty 1

## 2021-06-10 MED ORDER — POTASSIUM CHLORIDE 10 MEQ/100ML IV SOLN
10.0000 meq | INTRAVENOUS | Status: AC
  Administered 2021-06-10 (×4): 10 meq via INTRAVENOUS
  Filled 2021-06-10 (×4): qty 100

## 2021-06-10 MED ORDER — REVEFENACIN 175 MCG/3ML IN SOLN
175.0000 ug | Freq: Every day | RESPIRATORY_TRACT | Status: DC
  Administered 2021-06-10: 175 ug via RESPIRATORY_TRACT
  Filled 2021-06-10: qty 3

## 2021-06-10 MED ORDER — DEXMEDETOMIDINE HCL IN NACL 400 MCG/100ML IV SOLN
INTRAVENOUS | Status: AC
  Administered 2021-06-10: 0.4 ug/kg/h via INTRAVENOUS
  Filled 2021-06-10: qty 100

## 2021-06-10 MED ORDER — DEXMEDETOMIDINE HCL IN NACL 400 MCG/100ML IV SOLN
0.4000 ug/kg/h | INTRAVENOUS | Status: DC
  Administered 2021-06-11: 0.8 ug/kg/h via INTRAVENOUS
  Administered 2021-06-15: 0.2 ug/kg/h via INTRAVENOUS
  Administered 2021-06-15: 1.1 ug/kg/h via INTRAVENOUS
  Filled 2021-06-10 (×4): qty 100

## 2021-06-10 MED ORDER — MAGNESIUM SULFATE 2 GM/50ML IV SOLN
2.0000 g | Freq: Once | INTRAVENOUS | Status: AC
  Administered 2021-06-10: 2 g via INTRAVENOUS
  Filled 2021-06-10: qty 50

## 2021-06-10 MED ORDER — GUAIFENESIN 100 MG/5ML PO LIQD
5.0000 mL | Freq: Two times a day (BID) | ORAL | Status: DC
  Administered 2021-06-10 – 2021-06-12 (×2): 5 mL via ORAL
  Filled 2021-06-10 (×3): qty 5

## 2021-06-10 MED ORDER — ARFORMOTEROL TARTRATE 15 MCG/2ML IN NEBU
15.0000 ug | INHALATION_SOLUTION | Freq: Two times a day (BID) | RESPIRATORY_TRACT | Status: DC
  Administered 2021-06-10 – 2021-06-24 (×28): 15 ug via RESPIRATORY_TRACT
  Filled 2021-06-10 (×28): qty 2

## 2021-06-10 MED ORDER — LACTATED RINGERS IV SOLN: INTRAVENOUS | Status: DC

## 2021-06-10 MED ORDER — SODIUM CHLORIDE 3 % IN NEBU
4.0000 mL | INHALATION_SOLUTION | Freq: Every day | RESPIRATORY_TRACT | Status: AC
  Administered 2021-06-10 – 2021-06-12 (×2): 4 mL via RESPIRATORY_TRACT
  Filled 2021-06-10 (×3): qty 4

## 2021-06-10 MED ORDER — HYDRALAZINE HCL 20 MG/ML IJ SOLN
10.0000 mg | INTRAMUSCULAR | Status: DC | PRN
  Administered 2021-06-10: 20 mg via INTRAVENOUS
  Filled 2021-06-10: qty 1

## 2021-06-10 MED ORDER — IPRATROPIUM-ALBUTEROL 0.5-2.5 (3) MG/3ML IN SOLN: 3.0000 mL | Freq: Four times a day (QID) | RESPIRATORY_TRACT | Status: DC | PRN

## 2021-06-10 MED ORDER — IOHEXOL 350 MG/ML SOLN
50.0000 mL | Freq: Once | INTRAVENOUS | Status: AC | PRN
  Administered 2021-06-10: 50 mL via INTRAVENOUS

## 2021-06-10 MED ORDER — CHLORHEXIDINE GLUCONATE CLOTH 2 % EX PADS
6.0000 | MEDICATED_PAD | Freq: Every day | CUTANEOUS | Status: DC
  Administered 2021-06-10 – 2021-06-12 (×3): 6 via TOPICAL

## 2021-06-10 MED ORDER — POLYETHYLENE GLYCOL 3350 17 G PO PACK
17.0000 g | PACK | Freq: Every day | ORAL | Status: DC
  Administered 2021-06-10: 17 g
  Filled 2021-06-10: qty 1

## 2021-06-10 MED ORDER — HEPARIN SODIUM (PORCINE) 5000 UNIT/ML IJ SOLN
5000.0000 [IU] | Freq: Three times a day (TID) | INTRAMUSCULAR | Status: DC
  Administered 2021-06-10 – 2021-06-24 (×43): 5000 [IU] via SUBCUTANEOUS
  Filled 2021-06-10 (×42): qty 1

## 2021-06-10 MED ORDER — CLONAZEPAM 0.5 MG PO TABS
0.5000 mg | ORAL_TABLET | Freq: Two times a day (BID) | ORAL | Status: DC | PRN
  Administered 2021-06-10: 0.5 mg via ORAL
  Filled 2021-06-10: qty 1

## 2021-06-10 MED ORDER — HYDROXYZINE HCL 25 MG PO TABS: 25.0000 mg | ORAL_TABLET | Freq: Every evening | ORAL | Status: DC | PRN

## 2021-06-10 MED ORDER — MIDAZOLAM HCL 2 MG/2ML IJ SOLN
2.0000 mg | Freq: Once | INTRAMUSCULAR | Status: AC
  Administered 2021-06-10: 2 mg via INTRAVENOUS
  Filled 2021-06-10: qty 2

## 2021-06-10 MED ORDER — BUDESONIDE 0.25 MG/2ML IN SUSP
0.2500 mg | Freq: Two times a day (BID) | RESPIRATORY_TRACT | Status: DC
  Administered 2021-06-10 – 2021-06-24 (×29): 0.25 mg via RESPIRATORY_TRACT
  Filled 2021-06-10 (×28): qty 2

## 2021-06-10 MED ORDER — ZINC OXIDE 12.8 % EX OINT
TOPICAL_OINTMENT | Freq: Two times a day (BID) | CUTANEOUS | Status: DC
  Administered 2021-06-14 – 2021-06-24 (×14): 1 via TOPICAL
  Filled 2021-06-10: qty 56.7

## 2021-06-10 MED ORDER — ONDANSETRON HCL 4 MG/2ML IJ SOLN: 4.0000 mg | Freq: Four times a day (QID) | INTRAMUSCULAR | Status: DC | PRN

## 2021-06-10 MED ORDER — DOCUSATE SODIUM 100 MG PO CAPS: 100.0000 mg | ORAL_CAPSULE | Freq: Two times a day (BID) | ORAL | Status: DC | PRN

## 2021-06-10 MED ORDER — CHLORHEXIDINE GLUCONATE 0.12% ORAL RINSE (MEDLINE KIT)
15.0000 mL | Freq: Two times a day (BID) | OROMUCOSAL | Status: DC
  Administered 2021-06-11 – 2021-06-24 (×27): 15 mL via OROMUCOSAL

## 2021-06-10 MED ORDER — ALBUTEROL SULFATE (2.5 MG/3ML) 0.083% IN NEBU
2.5000 mg | INHALATION_SOLUTION | RESPIRATORY_TRACT | Status: DC | PRN
  Administered 2021-06-10 – 2021-06-11 (×2): 2.5 mg via RESPIRATORY_TRACT
  Filled 2021-06-10 (×2): qty 3

## 2021-06-10 MED ORDER — MORPHINE SULFATE 15 MG PO TABS
7.5000 mg | ORAL_TABLET | Freq: Once | ORAL | Status: AC
  Administered 2021-06-10: 7.5 mg via ORAL
  Filled 2021-06-10: qty 1

## 2021-06-10 MED ORDER — ACETAMINOPHEN 325 MG PO TABS: 650.0000 mg | ORAL_TABLET | Freq: Four times a day (QID) | ORAL | Status: DC | PRN

## 2021-06-10 MED ORDER — ORAL CARE MOUTH RINSE
15.0000 mL | OROMUCOSAL | Status: DC
  Administered 2021-06-11 – 2021-06-24 (×137): 15 mL via OROMUCOSAL

## 2021-06-10 MED ORDER — FENTANYL CITRATE PF 50 MCG/ML IJ SOSY
50.0000 ug | PREFILLED_SYRINGE | INTRAMUSCULAR | Status: DC | PRN
  Administered 2021-06-10: 50 ug via INTRAVENOUS
  Administered 2021-06-10: 100 ug via INTRAVENOUS
  Administered 2021-06-10: 50 ug via INTRAVENOUS
  Administered 2021-06-10: 200 ug via INTRAVENOUS
  Administered 2021-06-11 (×2): 100 ug via INTRAVENOUS
  Administered 2021-06-11 (×2): 50 ug via INTRAVENOUS
  Administered 2021-06-11: 100 ug via INTRAVENOUS
  Administered 2021-06-11 – 2021-06-18 (×3): 50 ug via INTRAVENOUS
  Filled 2021-06-10: qty 2
  Filled 2021-06-10 (×2): qty 1
  Filled 2021-06-10: qty 2
  Filled 2021-06-10: qty 1
  Filled 2021-06-10 (×3): qty 2
  Filled 2021-06-10: qty 4
  Filled 2021-06-10: qty 1
  Filled 2021-06-10: qty 2

## 2021-06-10 MED ORDER — POLYETHYLENE GLYCOL 3350 17 G PO PACK: 17.0000 g | PACK | Freq: Every day | ORAL | Status: DC | PRN

## 2021-06-10 MED ORDER — PANTOPRAZOLE SODIUM 40 MG IV SOLR
40.0000 mg | Freq: Every day | INTRAVENOUS | Status: DC
  Administered 2021-06-10 – 2021-06-20 (×11): 40 mg via INTRAVENOUS
  Filled 2021-06-10 (×11): qty 10

## 2021-06-10 MED ORDER — POTASSIUM CHLORIDE 10 MEQ/50ML IV SOLN: 10.0000 meq | INTRAVENOUS | Status: DC

## 2021-06-10 NOTE — Plan of Care (Signed)
  Problem: Clinical Measurements: Goal: Ability to maintain clinical measurements within normal limits will improve Outcome: Progressing Goal: Will remain free from infection Outcome: Progressing Goal: Diagnostic test results will improve Outcome: Progressing Goal: Respiratory complications will improve Outcome: Progressing Goal: Cardiovascular complication will be avoided Outcome: Progressing   Problem: Coping: Goal: Level of anxiety will decrease Outcome: Progressing   Problem: Pain Managment: Goal: General experience of comfort will improve Outcome: Progressing   Problem: Safety: Goal: Ability to remain free from injury will improve Outcome: Progressing   

## 2021-06-10 NOTE — Progress Notes (Signed)
eLink Physician-Brief Progress Note Patient Name: Kathleen Cameron DOB: [birth year] MRN: [id number]   Date of Service  06/10/2021  HPI/Events of Note  Patient c/o chest pain and increased WOB. She is a chronic trach/home ventilator patient. Progress notes from today discuss mucous plugging treated with 3% NaCl nebs and consideration given to bronchoscopy.   eICU Interventions  Plan: Portable CXR STAT. EKG STAT. Cycle Troponin. Will request that PCCM ground team evaluate the patient at bedside.      Intervention Category Major Interventions: Other:  Lenell Antu 06/10/2021, 8:51 PM

## 2021-06-10 NOTE — Progress Notes (Addendum)
Patient transported to 2H12 from ED without complications. RN at bedside. 

## 2021-06-10 NOTE — Telephone Encounter (Signed)
This encounter is old. Irving Burton spoke with Susie from [place] as seen in prior telephone message. Nothing is needed on this encounter.

## 2021-06-10 NOTE — Consult Note (Signed)
WOC Nurse Consult Note: Patient receiving care in [place]. Primary RN in room at time of my assessment. Reason for Consult: former PEG tube site with drainage. Wound type: as described Pressure Injury POA: Yes/No/NA Measurement:na Wound bed: pink Drainage (amount, consistency, odor) serous at time of my visit Periwound: slight irritation Dressing procedure/placement/frequency: Apply to abdominal former PEG tube site. Cover with a foam dressing. Change the foam dressing daily. Lift to apply medication. Thank you for the consult.  Discussed plan of care with the patient and bedside nurse.  [place] nurse will not follow at this time.  Please re-consult the [place] team if needed.  Val Riles, RN, MSN, CWOCN, CNS-BC, pager [phone]

## 2021-06-10 NOTE — TOC Initial Note (Signed)
Transition of Care [facility]) - Initial/Assessment Note    Patient Details  Name: Kathleen Cameron MRN: [id number] Date of Birth: [birth year]  Transition of Care [facility] CM/SW Contact:    Gala Lewandowsky, RN Phone Number: 06/10/2021, 4:43 PM  Clinical Narrative: Risk for readmission assessment completed. Prior to arrival patient was from home with support of spouse. Case Manager called the spouse and the patient has home health private duty RN from the Adult side from [place]. Case Manager did call Rob with [name] at [phone] to make him aware that the patient has been hospitalized. Patient has trach/vent from home and patient will need an order for resumption services stating resume private duty nursing. Rob will need a copy of the discharge summary and orders once the patient is stable to transition home faxed to [phone]. Rob would like to be notified once we have a discharge date in mind to get nursing arranged in the home. Case Manager will continue to follow for additional transition of care needs.               Expected Discharge Plan: Home w Home Health Services Barriers to Discharge: Continued Medical Work up   Patient Goals and CMS Choice     Choice offered to / list presented to : NA  Expected Discharge Plan and Services Expected Discharge Plan: Home w Home Health Services In-house Referral: NA Discharge Planning Services: CM Consult Post Acute Care Choice: Home Health, Resumption of Svcs/PTA Provider Living arrangements for the past 2 months: Apartment                   DME Agency: NA         HH Agency: [facility] (Adult side) Date HH Agency Contacted: 06/10/21 Time HH Agency Contacted: 1643 Representative spoke with at [facility] [place] Agency: Rob  Prior Living Arrangements/Services Living arrangements for the past 2 months: Apartment Lives with:: Spouse Patient language and need for interpreter reviewed:: Yes        Need for Family Participation  in Patient Care: Yes (Comment) Care giver support system in place?: Yes (comment) Current home services: DME, Home RN (vent- [facility] RN Adult side.) Criminal Activity/Legal Involvement Pertinent to Current Situation/Hospitalization: No - Comment as needed  Activities of Daily Living      Permission Sought/Granted Permission sought to share information with : Case Production designer, theatre/television/film, Magazine features editor, Family Supports Permission granted to share information with : Yes, Verbal Permission Granted     Permission granted to share info w AGENCY: [facility] adult side for RN services-Trach vent        Emotional Assessment Appearance:: Appears stated age Attitude/Demeanor/Rapport: Unable to Assess Affect (typically observed): Unable to Assess   Alcohol / Substance Use: Not Applicable Psych Involvement: No (comment)  Admission diagnosis:  Precordial pain [R07.2] Chronic respiratory failure with hypoxia (HCC) [J96.11] Patient Active Problem List   Diagnosis Date Noted   Intractable nausea and vomiting    Severe sepsis with septic shock (CODE) (HCC) 08/29/2020   Aspiration pneumonia (HCC)    Goals of care, counseling/discussion    Ileus (HCC)    Tracheostomy dependence (HCC)    Sepsis (HCC) 06/03/2020   Healthcare-associated pneumonia 06/03/2020   Pressure injury of skin 06/03/2020   Acute respiratory failure (HCC)    Seizure disorder (HCC)    COPD, severe (HCC)    Anxiety disorder    Generalized abdominal pain    Convulsions (HCC)    Malnutrition of moderate degree 02/27/2020  Hypotension    Acute hypercapnic respiratory failure (HCC) 02/25/2020   Right upper quadrant abdominal pain 02/01/2020   Hypothyroidism (acquired) 02/01/2020   Acute metabolic encephalopathy 01/29/2020   Hyponatremia 01/28/2020   Anxiety 12/02/2019   COPD exacerbation (HCC) 11/06/2019   Depression 11/06/2019   Acute on chronic respiratory failure with hypoxia and hypercapnia (HCC) 11/06/2019    Current moderate episode of major depressive disorder without prior episode (HCC) 09/20/2019   Pedal edema 08/04/2019   Hypokalemia 04/13/2019   Precordial pain 04/13/2019   Chronic respiratory failure with hypoxia (HCC) 03/07/2019   CKD (chronic kidney disease) 01/05/2019   COPD (chronic obstructive pulmonary disease) (HCC) 09/29/2018   Pulmonary nodules 09/27/2018   Dyslipidemia 07/07/2018   Essential hypertension 03/10/2018   PCP:  [name], MD Pharmacy:   [facility] [phone] - [place],  - [phone] AT [facility] [address] [place] [address] Phone: [phone] Fax: [phone]  [facility] [place], [place] - [address] [place] [address] Phone: [phone] Fax: [phone]  Readmission Risk Interventions Readmission Risk Prevention Plan 06/10/2021 02/02/2020  Transportation Screening Complete Complete  PCP or Specialist Appt within 3-5 Days - Complete  HRI or Home Care Consult - Complete  Social Work Consult for Recovery Care Planning/Counseling - Complete  Palliative Care Screening - Not Applicable  Medication Review Oceanographer) Complete Complete  HRI or Home Care Consult Complete -  SW Recovery Care/Counseling Consult Complete -  Palliative Care Screening Not Applicable -  Skilled Nursing Facility Not Applicable -  Some recent data might be hidden

## 2021-06-10 NOTE — ED Notes (Signed)
Called [facility] supervisor to inquire about a cord to the patients portable vent. Husband states that it was brought along with her when she was transported and believe it was left on the truck. The crew that brought the patient in is currently on another call and will reach out once they are able to check the truck.

## 2021-06-10 NOTE — Progress Notes (Signed)
PCCM Interval Progress Note  Anxiety and hypertension.  Has been hypertensive most of the day.  Had Fentanyl and Versed earlier this evening which per RN lasted around 45 minutes before wearing off and agitation returned.  Ordered Hydralazine and Precedex.  RN to notify [facility] if further meds needed.   Kathleen Cameron, [facility] For pager details, please see AMION or use Epic chat  After 1900, please call [facility] for cross coverage needs 06/10/2021, 11:14 PM

## 2021-06-10 NOTE — H&P (Signed)
NAME:  Kathleen Cameron, MRN:  [id number], DOB:  Dec 19, 1965, LOS: 0 ADMISSION DATE:  06/03/2021, CONSULTATION DATE:  2/20 REFERRING MD:  Ralene Bathe , CHIEF COMPLAINT:  chest pain   History of Present Illness:  Kathleen Cameron, is a 56 y.o. female, who presented to the [facility] ED with a chief complaint of chest pain  They have a pertinent past medical history of severe COPD, Chronic respiratory failure with tracheostomy in place, hypertension, anxiety.  Patient of Dr. Lamonte Sakai.  Prior to arrival, patient recently had change in ventilation from tidal volume of 480 down to tidal volume of 400.  Patient has presented to the emergency department 3 times in the past 24 hours.  Was discharged from the emergency department at 1500 after presenting with abdominal pain.  CT chest abdomen pelvis negative for new/acute abnormality within the chest, abdomen, or pelvis. presented again at 67 with chest pain  ED course was notable for stable troponin (18> 14), D-dimer slightly elevated CTA chest obtained, negative for PE, concern on CT for acute airway narrowing and opacification at the left mainstem bronchus, left lower lobar bronchus, and lower subsegmental lower lobe airways.   Of note, home ventilator power cord was lost during transport. Home vent out of battery. EMS contacted to search for cord.   PCCM was consulted for admission.    Pertinent  Medical History  severe COPD, Chronic respiratory failure with tracheostomy in place, hypertension, anxiety  Significant Hospital Events: Including procedures, antibiotic start and stop dates in addition to other pertinent events   2/19-20: presented to ED x3 times. Abd pain: CT chest/abd/pelvis negative for acute process. CT chest: concern for L airway narrowing.   Interim History / Subjective:  See above  Subjective: Complains of chest pain, somewhat short of breath  Objective   Blood pressure (!) 125/97, pulse 76, temperature 98 F (36.7 C), temperature  source Oral, resp. rate 16, SpO2 100 %.    Vent Mode: AC FiO2 (%):  [40 %-44 %] 44 % Set Rate:  [16 bmp] 16 bmp Vt Set:  [400 mL] 400 mL PEEP:  [5 cmH20] 5 cmH20  No intake or output data in the 24 hours ending 06/10/21 0536 There were no vitals filed for this visit.  Examination: General: In bed, some distress, uncomfortable, thin HEENT: MM pink/moist, anicteric, atraumatic, poor dentintion Neuro: RASS +1, PERRL 68m, GCS 11T CV: S1S2, NSR, no m/r/g appreciated PULM: Diminished LLL compared to other lobes, air movement throughout, trachea midline, chest expansion symmetric, 8.0 trach c/d/i GI: soft, bsx4 active, tenderness around PEG site Extremities: warm/dry, no pretibial edema, capillary refill less than 3 seconds  Skin: See peg tube image below, no rashes or lesions noted     Pertinent labs Troponin: 18> 14 WBC 6.6, hemoglobin 11.3.  Sodium 134, chloride 90, CO2 33, alk phos 138 Lipase: 26 VBG 7.5/55.6/160/43.6 D-dimer 0.86 Chest x-ray: Stable trach no pneumo, pulmonary edema, acute airspace disease. Emphysema CTA chest: No PE.  Acute airway narrowing and opacification in the left mainstem bronchus and the left lower bronchus.  Suspect mucous plugging. Twelve-lead: Sinus rhythm, left anterior fascicular block, no ST changes noted  Resolved Hospital Problem list     Assessment & Plan:  Chronic respiratory failure Trach in place Home vent settings 400, RR 16, Peep 5, 02 2-6L, pt of Dr BLamonte Sakai VBG 7.5/55.6/160/43.6. Acute airway narrowing and opacification in the left mainstem bronchus and the left lower bronchus.  Suspect mucous plugging. Husband feels that she has not  mucous plugged in a long time and states that they are thorough with suctioning at home. Husband feels that every time patient has vent change patient has issues for some time. Of note, home ventilator power cord was lost during transport. Home vent out of battery. EMS contacted to search for cord.  -Admit to  ICU -Aggressive pulmonary toilet with chest pt and saline nebs.Depending on progress bronchoscopy may be indicated.  -May need TOC assistance if power cord to home vent is lost.  -Nebulizers as below  -No leukocytosis or fever. Will discuss if additional antibiotics are needed.   COPD severe -Continue brovana, pulmicort, yuperli, prn duonebs, azithromycin 250 once daily -Continue zithromax daily.   Chest pain Troponin stable, twelve-lead note ST changes, believes that it may be due to to airway narrowing seen on CT scan or recent ventilatory changes requiring time for adjustment. -Continuous telemetry -As needed EKG and twelve-lead  Abdominal pain CT abdomen negative for acute process.  History of PEG tube.  Lipase normal. -Monitor  PEG tube site wound -WOC consult  Hypothyroidism -continue synthroid. Clarifying how patient takes medications without peg tube.  Chronic, normocytic anemia No bleeding noted, ?nutrition related -Transfuse PRBC if HBG less than 7 -Obtain AM CBC to trend H&H -Monitor for signs of bleeding  HX Hypertension -resume amlodipine 11m daily. Clarifying how patient takes medications without peg tube.  Best Practice (right click and "Reselect all SmartList Selections" daily)   Diet/type: NPO DVT prophylaxis: prophylactic heparin  GI prophylaxis: PPI Lines: N/A Foley:  N/A Code Status:  full code Last date of multidisciplinary goals of care discussion [pending]  Labs   CBC: Recent Labs  Lab 06/08/21 1635 06/10/21 0042 06/10/21 0048  WBC 11.9* 6.6  --   NEUTROABS 10.1* 4.8  --   HGB 11.8* 11.3* 12.6  HCT 38.4 35.8* 37.0  MCV 89.5 86.7  --   PLT 334 239  --     Basic Metabolic Panel: Recent Labs  Lab 06/08/21 1635 06/10/21 0042 06/10/21 0048  NA 137 134* 136  K 4.0 4.2 4.4  CL 94* 90*  --   CO2 34* 33*  --   GLUCOSE 254* 98  --   BUN 15 11  --   CREATININE 0.97 0.92  --   CALCIUM 10.0 9.8  --    GFR: Estimated Creatinine  Clearance: 56.4 mL/min (by C-G formula based on SCr of 0.92 mg/dL). Recent Labs  Lab 06/08/21 1635 06/10/21 0042  WBC 11.9* 6.6    Liver Function Tests: Recent Labs  Lab 06/08/21 1635 06/10/21 0042  AST 37 41  ALT 19 17  ALKPHOS 186* 138*  BILITOT 0.3 0.9  PROT 9.7* 8.3*  ALBUMIN 4.3 3.8   Recent Labs  Lab 06/08/21 1635 06/10/21 0042  LIPASE 26 25   No results for input(s): AMMONIA in the last 168 hours.  ABG    Component Value Date/Time   PHART 7.511 (H) 09/16/2020 2330   PCO2ART 54.4 (H) 09/16/2020 2330   PO2ART 139 (H) 09/16/2020 2330   HCO3 43.6 (H) 06/10/2021 0048   TCO2 45 (H) 06/10/2021 0048   O2SAT 100 06/10/2021 0048     Coagulation Profile: No results for input(s): INR, PROTIME in the last 168 hours.  Cardiac Enzymes: No results for input(s): CKTOTAL, CKMB, CKMBINDEX, TROPONINI in the last 168 hours.  HbA1C: Hgb A1c MFr Bld  Date/Time Value Ref Range Status  06/21/2020 02:24 AM 5.3 4.8 - 5.6 % Final  Comment:    (NOTE) Pre diabetes:          5.7%-6.4%  Diabetes:              >6.4%  Glycemic control for   <7.0% adults with diabetes   02/26/2020 05:18 PM 5.4 4.8 - 5.6 % Final    Comment:    (NOTE) Pre diabetes:          5.7%-6.4%  Diabetes:              >6.4%  Glycemic control for   <7.0% adults with diabetes     CBG: No results for input(s): GLUCAP in the last 168 hours.  Review of Systems:   Positives in bold  Gen: fever, chills, weight change, fatigue, night sweats HEENT:  blurred vision, double vision, hearing loss, tinnitus, sinus congestion, rhinorrhea, sore throat, neck stiffness, dysphagia PULM:  shortness of breath, cough, sputum production, hemoptysis, wheezing CV: chest pain, edema, orthopnea, paroxysmal nocturnal dyspnea, palpitations GI:  abdominal pain, nausea, vomiting, diarrhea, hematochezia, melena, constipation, change in bowel habits GU: dysuria, hematuria, polyuria, oliguria, urethral discharge Endocrine:  hot or cold intolerance, polyuria, polyphagia or appetite change Derm: rash, dry skin, scaling or peeling skin change Heme: easy bruising, bleeding, bleeding gums Neuro: headache, numbness, weakness, slurred speech, loss of memory or consciousness   Past Medical History:  She,  has a past medical history of Acute on chronic respiratory failure with hypoxia ([place]), Anxiety disorder, Asthma, COPD (chronic obstructive pulmonary disease) ([place]), COPD, severe ([place]), Hypertension, and Seizure disorder ([place]).   Surgical History:   Past Surgical History:  Procedure Laterality Date   COLONOSCOPY N/A 06/26/2020   Procedure: COLONOSCOPY;  Surgeon: Carol Ada, MD;  Location: [place];  Service: Endoscopy;  Laterality: N/A;   ENTEROSCOPY N/A 06/26/2020   Procedure: ENTEROSCOPY;  Surgeon: Carol Ada, MD;  Location: [place];  Service: Endoscopy;  Laterality: N/A;   HEMOSTASIS CLIP PLACEMENT  06/26/2020   Procedure: HEMOSTASIS CLIP PLACEMENT;  Surgeon: Carol Ada, MD;  Location: [place];  Service: Endoscopy;;   HOT HEMOSTASIS N/A 06/26/2020   Procedure: HOT HEMOSTASIS (ARGON PLASMA COAGULATION/BICAP);  Surgeon: Carol Ada, MD;  Location: [place];  Service: Endoscopy;  Laterality: N/A;   IR FLUORO RM 30-60 MIN  03/23/2020   IR REPLC GASTRO/COLONIC TUBE PERCUT W/FLUORO  09/12/2020   IR REPLC GASTRO/COLONIC TUBE PERCUT W/FLUORO  12/26/2020   LAPAROSCOPIC INSERTION GASTROSTOMY TUBE N/A 04/24/2020   Procedure: LAPAROSCOPIC GASTROSTOMY TUBE PLACEMENT;  Surgeon: Mickeal Skinner, MD;  Location: [place];  Service: General;  Laterality: N/A;   POLYPECTOMY  06/26/2020   Procedure: POLYPECTOMY;  Surgeon: Carol Ada, MD;  Location: [place];  Service: Endoscopy;;   WISDOM TOOTH EXTRACTION       Social History:   reports that she has never smoked. She has never used smokeless tobacco. She reports current alcohol use of about 2.0 standard drinks per week. She reports that she does not use  drugs.   Family History:  Her Family history is unknown by patient.   Allergies Allergies  Allergen Reactions   Stiolto Respimat [Tiotropium Bromide-Olodaterol] Other (See Comments)    Severe headaches and rapid heartrate     Home Medications  Prior to Admission medications   Medication Sig Start Date End Date Taking? Authorizing Provider  acetaminophen (TYLENOL) 325 MG tablet Take 325 mg by mouth every 6 (six) hours as needed for moderate pain or headache.   Yes [provider]  amLODipine (NORVASC) 5  MG tablet Take 5 mg by mouth daily. 05/21/21  Yes [provider]  arformoterol (BROVANA) 15 MCG/2ML NEBU INHALE 2 MLS(1 VIAL) VIA NEBULIZATION TWICE DAILY Patient taking differently: Take 15 mcg by nebulization in the morning and at bedtime. 11/16/20  Yes Collene Gobble, MD  azithromycin (ZITHROMAX) 250 MG tablet Take 1 tablet by mouth once a day Patient taking differently: Take 250 mg by mouth daily. Continuously 05/24/21  Yes Collene Gobble, MD  bisacodyl (DULCOLAX) 10 MG suppository Place 1 suppository (10 mg total) rectally daily as needed for severe constipation. 07/10/20  Yes Sagardia, Ines Bloomer, MD  budesonide (PULMICORT) 0.5 MG/2ML nebulizer solution Take 0.5 mg by nebulization in the morning and at bedtime. 05/24/21  Yes [provider]  chlorhexidine gluconate, MEDLINE KIT, (PERIDEX) 0.12 % solution 15 mLs by Mouth Rinse route 2 (two) times daily. 02/07/21  Yes Sagardia, Ines Bloomer, MD  clonazePAM (KLONOPIN) 1 MG tablet PLACE 1/2 TABLET INTO FEEDING TUBE THREE TIMES DAILY AS NEEDED FOR ANXIETY Patient taking differently: Take 0.5 mg by mouth 3 (three) times daily as needed for anxiety. 12/06/20  Yes Sagardia, Ines Bloomer, MD  clotrimazole-betamethasone (LOTRISONE) cream Apply 1 application topically 2 (two) times daily. Patient taking differently: Apply 1 application topically daily. 01/10/21  Yes Sagardia, Ines Bloomer, MD  hydrOXYzine (ATARAX) 25 MG tablet  TAKE 1 TABLET(25 MG) BY MOUTH AT BEDTIME AS NEEDED Patient taking differently: Take 25 mg by mouth at bedtime as needed for anxiety (sleep). 05/20/21  Yes Sagardia, Ines Bloomer, MD  ipratropium-albuterol (DUONEB) 0.5-2.5 (3) MG/3ML SOLN INHALE CONTENT OF 1 VIAL PER NEBULIZER EVERY 4 HOURS Patient taking differently: Take 3 mLs by nebulization every 4 (four) hours as needed (shortness of breath). 02/12/21  Yes Collene Gobble, MD  levothyroxine (SYNTHROID) 88 MCG tablet TAKE 1 TABLET BY MOUTH EVERY DAY AT 6 AM Patient taking differently: Take 88 mcg by mouth daily before breakfast. 04/19/21  Yes Sagardia, Ines Bloomer, MD  lip balm (CARMEX) ointment Apply topically as needed for lip care. Patient taking differently: Apply 1 application topically as needed for lip care. 03/08/20  Yes Candee Furbish, MD  metoCLOPramide (REGLAN) 10 MG tablet Take 1 tablet (10 mg total) by mouth every 8 (eight) hours as needed for nausea. 04/10/21 07/09/21 Yes Sagardia, Ines Bloomer, MD  mupirocin ointment (BACTROBAN) 2 % Apply 1 application topically 2 (two) times daily. 04/10/21  Yes Sagardia, Ines Bloomer, MD  Nutritional Supplements (ENSURE ACTIVE HIGH PROTEIN) LIQD Take 1 Can by mouth in the morning and at bedtime. 07/09/20  Yes Ghimire, Dante Gang, MD  pantoprazole (PROTONIX) 40 MG tablet TAKE 1 TABLET(40 MG) BY MOUTH DAILY Patient taking differently: Take 40 mg by mouth daily. 05/13/21  Yes Sagardia, Ines Bloomer, MD  phenol (CHLORASEPTIC) 1.4 % LIQD Use as directed 1 spray in the mouth or throat as needed for throat irritation / pain. 03/08/20  Yes Candee Furbish, MD  polyethylene glycol (MIRALAX / GLYCOLAX) 17 g packet Place 17 g into feeding tube daily as needed for mild constipation. 03/08/20  Yes Candee Furbish, MD  revefenacin [facility]) 175 MCG/3ML nebulizer solution INHALE 3 MLS(1 VIAL) VIA NEBULIZATION ONCE DAILY Patient taking differently: Take 175 mcg by nebulization daily. 03/29/21  Yes Collene Gobble, MD   rosuvastatin (CRESTOR) 20 MG tablet Take 20 mg by mouth at bedtime. 05/14/21  Yes [provider]  senna-docusate (SENOKOT-S) 8.6-50 MG tablet Place 2 tablets into feeding tube at bedtime as needed for  moderate constipation. Patient taking differently: Take 2 tablets by mouth at bedtime as needed for moderate constipation. 11/27/20  Yes McDonald, Mia A, PA-C  Simethicone 125 MG CAPS Give 125 mg by tube daily.   Yes [provider]  sodium chloride (OCEAN) 0.65 % SOLN nasal spray Place 1 spray into both nostrils as needed for congestion. 03/08/20  Yes Candee Furbish, MD  sorbitol 70 % SOLN Place 30 mLs into feeding tube daily as needed for moderate constipation. Patient taking differently: Take 30 mLs by mouth daily as needed for moderate constipation. 07/25/20  Yes Candee Furbish, MD  traMADol (ULTRAM) 50 MG tablet TAKE 1 TABLET(50 MG) BY MOUTH THREE TIMES DAILY AS NEEDED Patient taking differently: Take 50 mg by mouth 3 (three) times daily as needed for moderate pain. 05/23/21  Yes Horald Pollen, MD  venlafaxine (EFFEXOR) 37.5 MG tablet PLACE 1 TABLET INTO FEEDING TUBE 2 TIMES A DAY WITH A MEAL Patient taking differently: Take 37.5 mg by mouth 2 (two) times daily with a meal. 01/13/21  Yes Sagardia, Ines Bloomer, MD  amLODipine (NORVASC) 10 MG tablet Place 1 tablet (10 mg total) into feeding tube daily. Patient not taking: Reported on 06/10/2021 09/18/20   Mercy Riding, MD  atorvastatin (LIPITOR) 40 MG tablet Take 1 tablet (40 mg total) by mouth at bedtime. Patient not taking: Reported on 06/10/2021 02/10/21 05/11/21  Horald Pollen, MD  budesonide (PULMICORT) 0.25 MG/2ML nebulizer solution Take 2 mLs (0.25 mg total) by nebulization 2 (two) times daily. Patient not taking: Reported on 06/10/2021 01/25/21   Collene Gobble, MD  Chlorhexidine Gluconate Cloth 2 % PADS Apply 6 each topically daily. Patient not taking: Reported on 06/10/2021 08/21/20   Horald Pollen, MD   doxycycline (MONODOX) 100 MG capsule Take 1 capsule (100 mg total) by mouth 2 (two) times daily. Patient not taking: Reported on 06/10/2021 11/15/20   Binnie Rail, MD  ELIQUIS 5 MG TABS tablet PLACE 1 TABLET INTO FEEDING TUBE TWICE DAILY Patient not taking: Reported on 06/10/2021 12/17/20   Horald Pollen, MD  Nutritional Supplements (FEEDING SUPPLEMENT, OSMOLITE 1.2 CAL,) LIQD Place 1,000 mLs into feeding tube continuous. Patient not taking: Reported on 06/10/2021 07/10/20   Horald Pollen, MD  simethicone [facility]) 40 CM/0.3KJ drops Place 0.6 mLs (40 mg total) into feeding tube every 6 (six) hours. Patient not taking: Reported on 06/10/2021 09/18/20   Mercy Riding, MD  temazepam (RESTORIL) 15 MG capsule Place 1 capsule (15 mg total) into feeding tube at bedtime as needed for sleep. Patient not taking: Reported on 06/10/2021 10/29/20   Horald Pollen, MD     Critical care time: 82 minutes    Redmond School., MSN, APRN, AGACNP-BC [facility]  06/10/2021 , 6:43 AM  Please see [email or web address] for pager details  If no response, please call [phone] After hours, please call Elink at [phone]

## 2021-06-10 NOTE — Progress Notes (Addendum)
eLink Physician-Brief Progress Note Patient Name: Kathleen Cameron DOB: Aug 26, 1965 MRN: [id number]   Date of Service  06/10/2021  HPI/Events of Note  Review of CXR reveals:  1. No active disease. No evidence of pneumonia or pulmonary edema. 2. Stable chronic scarring and emphysematous changes. 3. Tracheostomy tube appears appropriately positioned in the midline. EKG done, however, has not crossed into Epic. Nursing reports NSR with no acute changes.  Sat = 90%, RR = 16, BP = 144/88 and HR = 85. Pateint is fully supported on mechanical ventilator   eICU Interventions  Plan: Versed 2 mg IV X 1 now.  Albuterol 2.5 mg nia neb now and Q 3 hours PRN SOB or wheezing.      Intervention Category Major Interventions: Other:  Lysle Dingwall 06/10/2021, 9:38 PM

## 2021-06-11 ENCOUNTER — Inpatient Hospital Stay (HOSPITAL_COMMUNITY): Payer: Medicare Other

## 2021-06-11 ENCOUNTER — Inpatient Hospital Stay (HOSPITAL_COMMUNITY)
Admission: RE | Admit: 2021-06-11 | Discharge: 2021-06-11 | Disposition: A | Discharge status: Home / Self Care | Payer: Medicare Other | Source: Ambulatory Visit

## 2021-06-11 DIAGNOSIS — J9611 Chronic respiratory failure with hypoxia: Secondary | ICD-10-CM | POA: Diagnosis not present

## 2021-06-11 DIAGNOSIS — I959 Hypotension, unspecified: Secondary | ICD-10-CM | POA: Diagnosis not present

## 2021-06-11 DIAGNOSIS — J95859 Other complication of respirator [ventilator]: Secondary | ICD-10-CM | POA: Diagnosis not present

## 2021-06-11 DIAGNOSIS — R0902 Hypoxemia: Secondary | ICD-10-CM | POA: Diagnosis not present

## 2021-06-11 DIAGNOSIS — R0602 Shortness of breath: Secondary | ICD-10-CM

## 2021-06-11 DIAGNOSIS — J9602 Acute respiratory failure with hypercapnia: Secondary | ICD-10-CM | POA: Diagnosis not present

## 2021-06-11 DIAGNOSIS — Z930 Tracheostomy status: Secondary | ICD-10-CM | POA: Diagnosis not present

## 2021-06-11 DIAGNOSIS — R079 Chest pain, unspecified: Secondary | ICD-10-CM

## 2021-06-11 DIAGNOSIS — J398 Other specified diseases of upper respiratory tract: Secondary | ICD-10-CM | POA: Diagnosis not present

## 2021-06-11 LAB — POCT I-STAT 7, (LYTES, BLD GAS, ICA,H+H)
Acid-Base Excess: 8 mmol/L — ABNORMAL HIGH (ref 0.0–2.0)
Acid-Base Excess: 7 mmol/L — ABNORMAL HIGH (ref 0.0–2.0)
Acid-Base Excess: 4 mmol/L — ABNORMAL HIGH (ref 0.0–2.0)
Bicarbonate: 39.5 mmol/L — ABNORMAL HIGH (ref 20.0–28.0)
Bicarbonate: 37.5 mmol/L — ABNORMAL HIGH (ref 20.0–28.0)
Bicarbonate: 32.6 mmol/L — ABNORMAL HIGH (ref 20.0–28.0)
Calcium, Ion: 1.4 mmol/L (ref 1.15–1.40)
Calcium, Ion: 1.37 mmol/L (ref 1.15–1.40)
Calcium, Ion: 1.35 mmol/L (ref 1.15–1.40)
HCT: 38 % (ref 36.0–46.0)
HCT: 37 % (ref 36.0–46.0)
HCT: 33 % — ABNORMAL LOW (ref 36.0–46.0)
Hemoglobin: 12.9 g/dL (ref 12.0–15.0)
Hemoglobin: 12.6 g/dL (ref 12.0–15.0)
Hemoglobin: 11.2 g/dL — ABNORMAL LOW (ref 12.0–15.0)
O2 Saturation: 95 %
O2 Saturation: 98 %
O2 Saturation: 100 %
Patient temperature: 97.5
Patient temperature: 98.1
Patient temperature: 97.7
Potassium: 4.1 mmol/L (ref 3.5–5.1)
Potassium: 3.5 mmol/L (ref 3.5–5.1)
Potassium: 3.7 mmol/L (ref 3.5–5.1)
Sodium: 140 mmol/L (ref 135–145)
Sodium: 140 mmol/L (ref 135–145)
Sodium: 137 mmol/L (ref 135–145)
TCO2: 42 mmol/L — ABNORMAL HIGH (ref 22–32)
TCO2: 40 mmol/L — ABNORMAL HIGH (ref 22–32)
TCO2: 35 mmol/L — ABNORMAL HIGH (ref 22–32)
pCO2 arterial: 95.2 mmHg (ref 32–48)
pCO2 arterial: 90.7 mmHg (ref 32–48)
pCO2 arterial: 65.9 mmHg (ref 32–48)
pH, Arterial: 7.222 — ABNORMAL LOW (ref 7.35–7.45)
pH, Arterial: 7.223 — ABNORMAL LOW (ref 7.35–7.45)
pH, Arterial: 7.299 — ABNORMAL LOW (ref 7.35–7.45)
pO2, Arterial: 96 mmHg (ref 83–108)
pO2, Arterial: 121 mmHg — ABNORMAL HIGH (ref 83–108)
pO2, Arterial: 203 mmHg — ABNORMAL HIGH (ref 83–108)

## 2021-06-11 LAB — ECHOCARDIOGRAM LIMITED
Height: 65 in
S' Lateral: 1.65 cm
Weight: 1809.54 oz

## 2021-06-11 LAB — COMPREHENSIVE METABOLIC PANEL
ALT: 18 U/L (ref 0–44)
AST: 37 U/L (ref 15–41)
Albumin: 3.7 g/dL (ref 3.5–5.0)
Alkaline Phosphatase: 139 U/L — ABNORMAL HIGH (ref 38–126)
Anion gap: 9 (ref 5–15)
BUN: 18 mg/dL (ref 6–20)
CO2: 32 mmol/L (ref 22–32)
Calcium: 9.6 mg/dL (ref 8.9–10.3)
Chloride: 95 mmol/L — ABNORMAL LOW (ref 98–111)
Creatinine, Ser: 1.48 mg/dL — ABNORMAL HIGH (ref 0.44–1.00)
GFR, Estimated: 41 mL/min — ABNORMAL LOW (ref >60)
Glucose, Bld: 167 mg/dL — ABNORMAL HIGH (ref 70–99)
Potassium: 4.4 mmol/L (ref 3.5–5.1)
Sodium: 136 mmol/L (ref 135–145)
Total Bilirubin: 0.2 mg/dL — ABNORMAL LOW (ref 0.3–1.2)
Total Protein: 8.4 g/dL — ABNORMAL HIGH (ref 6.5–8.1)

## 2021-06-11 LAB — CBC
HCT: 39.7 % (ref 36.0–46.0)
Hemoglobin: 11.8 g/dL — ABNORMAL LOW (ref 12.0–15.0)
MCH: 26.6 pg (ref 26.0–34.0)
MCHC: 29.7 g/dL — ABNORMAL LOW (ref 30.0–36.0)
MCV: 89.4 fL (ref 80.0–100.0)
Platelets: 334 10*3/uL (ref 150–400)
RBC: 4.44 MIL/uL (ref 3.87–5.11)
RDW: 15.2 % (ref 11.5–15.5)
WBC: 13.1 10*3/uL — ABNORMAL HIGH (ref 4.0–10.5)
nRBC: 0 % (ref 0.0–0.2)

## 2021-06-11 LAB — RESPIRATORY PANEL BY PCR

## 2021-06-11 LAB — BLOOD GAS, ARTERIAL
Acid-Base Excess: 1.4 mmol/L (ref 0.0–2.0)
Bicarbonate: 34.1 mmol/L — ABNORMAL HIGH (ref 20.0–28.0)
Drawn by: 39899
FIO2: 60 %
O2 Saturation: 99.3 %
Patient temperature: 36
pCO2 arterial: 101 mmHg (ref 32–48)
pH, Arterial: 7.13 — CL (ref 7.35–7.45)
pO2, Arterial: 117 mmHg — ABNORMAL HIGH (ref 83–108)

## 2021-06-11 LAB — GLUCOSE, CAPILLARY
Glucose-Capillary: 157 mg/dL — ABNORMAL HIGH (ref 70–99)
Glucose-Capillary: 177 mg/dL — ABNORMAL HIGH (ref 70–99)

## 2021-06-11 LAB — BASIC METABOLIC PANEL
Anion gap: 10 (ref 5–15)
BUN: 12 mg/dL (ref 6–20)
CO2: 32 mmol/L (ref 22–32)
Calcium: 10 mg/dL (ref 8.9–10.3)
Chloride: 94 mmol/L — ABNORMAL LOW (ref 98–111)
Creatinine, Ser: 0.98 mg/dL (ref 0.44–1.00)
GFR, Estimated: >60 mL/min (ref >60)
Glucose, Bld: 158 mg/dL — ABNORMAL HIGH (ref 70–99)
Potassium: 4.2 mmol/L (ref 3.5–5.1)
Sodium: 136 mmol/L (ref 135–145)

## 2021-06-11 LAB — MRSA NEXT GEN BY PCR, NASAL: MRSA by PCR Next Gen: NOT DETECTED

## 2021-06-11 LAB — MAGNESIUM: Magnesium: 2.4 mg/dL (ref 1.7–2.4)

## 2021-06-11 LAB — PHOSPHORUS: Phosphorus: 3.9 mg/dL (ref 2.5–4.6)

## 2021-06-11 LAB — TROPONIN I (HIGH SENSITIVITY): Troponin I (High Sensitivity): 11 ng/L (ref <18)

## 2021-06-11 MED ORDER — ROCURONIUM BROMIDE 10 MG/ML (PF) SYRINGE
PREFILLED_SYRINGE | INTRAVENOUS | Status: AC
  Administered 2021-06-11: 80 mg via INTRAVENOUS
  Filled 2021-06-11: qty 10

## 2021-06-11 MED ORDER — FENTANYL 2500MCG IN NS 250ML (10MCG/ML) PREMIX INFUSION
0.0000 ug/h | INTRAVENOUS | Status: DC
  Administered 2021-06-11: 50 ug/h via INTRAVENOUS
  Administered 2021-06-11 – 2021-06-12 (×3): 250 ug/h via INTRAVENOUS
  Administered 2021-06-13 (×2): 300 ug/h via INTRAVENOUS
  Administered 2021-06-13 – 2021-06-14 (×2): 200 ug/h via INTRAVENOUS
  Administered 2021-06-15: 150 ug/h via INTRAVENOUS
  Administered 2021-06-15: 175 ug/h via INTRAVENOUS
  Administered 2021-06-16: 200 ug/h via INTRAVENOUS
  Administered 2021-06-17: 100 ug/h via INTRAVENOUS
  Administered 2021-06-18: 125 ug/h via INTRAVENOUS
  Administered 2021-06-19: 175 ug/h via INTRAVENOUS
  Administered 2021-06-19: 200 ug/h via INTRAVENOUS
  Administered 2021-06-20 (×2): 150 ug/h via INTRAVENOUS
  Administered 2021-06-21 (×2): 200 ug/h via INTRAVENOUS
  Administered 2021-06-22 – 2021-06-23 (×2): 100 ug/h via INTRAVENOUS
  Administered 2021-06-24: 150 ug/h via INTRAVENOUS
  Filled 2021-06-11 (×22): qty 250

## 2021-06-11 MED ORDER — POTASSIUM CHLORIDE 10 MEQ/100ML IV SOLN
10.0000 meq | INTRAVENOUS | Status: AC
  Administered 2021-06-11 (×3): 10 meq via INTRAVENOUS
  Filled 2021-06-11 (×3): qty 100

## 2021-06-11 MED ORDER — MIDAZOLAM HCL 2 MG/2ML IJ SOLN
2.0000 mg | INTRAMUSCULAR | Status: DC | PRN
  Administered 2021-06-15 – 2021-06-23 (×10): 2 mg via INTRAVENOUS
  Filled 2021-06-11 (×12): qty 2

## 2021-06-11 MED ORDER — MIDAZOLAM HCL 2 MG/2ML IJ SOLN
INTRAMUSCULAR | Status: AC
Start: 2021-06-11 — End: 2021-06-11
  Filled 2021-06-11: qty 2

## 2021-06-11 MED ORDER — PROPOFOL 1000 MG/100ML IV EMUL
5.0000 ug/kg/min | INTRAVENOUS | Status: DC
  Administered 2021-06-11: 60 ug/kg/min via INTRAVENOUS
  Administered 2021-06-11: 10 ug/kg/min via INTRAVENOUS
  Administered 2021-06-11: 60 ug/kg/min via INTRAVENOUS
  Administered 2021-06-12: 70 ug/kg/min via INTRAVENOUS
  Administered 2021-06-12: 60 ug/kg/min via INTRAVENOUS
  Administered 2021-06-12: 80 ug/kg/min via INTRAVENOUS
  Administered 2021-06-12: 50 ug/kg/min via INTRAVENOUS
  Administered 2021-06-12: 65 ug/kg/min via INTRAVENOUS
  Administered 2021-06-13: 60 ug/kg/min via INTRAVENOUS
  Administered 2021-06-13: 50 ug/kg/min via INTRAVENOUS
  Administered 2021-06-13 (×2): 80 ug/kg/min via INTRAVENOUS
  Administered 2021-06-13: 50 ug/kg/min via INTRAVENOUS
  Administered 2021-06-14: 70 ug/kg/min via INTRAVENOUS
  Administered 2021-06-14: 80 ug/kg/min via INTRAVENOUS
  Administered 2021-06-14: 50 ug/kg/min via INTRAVENOUS
  Administered 2021-06-14: 60 ug/kg/min via INTRAVENOUS
  Administered 2021-06-15: 50 ug/kg/min via INTRAVENOUS
  Filled 2021-06-11 (×7): qty 100
  Filled 2021-06-11: qty 200
  Filled 2021-06-11 (×10): qty 100

## 2021-06-11 MED ORDER — ROCURONIUM BROMIDE 50 MG/5ML IV SOLN
50.0000 mg | Freq: Once | INTRAVENOUS | Status: AC
  Filled 2021-06-11: qty 5

## 2021-06-11 MED ORDER — PHENYLEPHRINE 40 MCG/ML (10ML) SYRINGE FOR IV PUSH (FOR BLOOD PRESSURE SUPPORT): 80.0000 ug | PREFILLED_SYRINGE | Freq: Once | INTRAVENOUS | Status: AC | PRN

## 2021-06-11 MED ORDER — SODIUM CHLORIDE 0.9 % IV SOLN
2.0000 g | INTRAVENOUS | Status: DC
  Administered 2021-06-11 – 2021-06-13 (×3): 2 g via INTRAVENOUS
  Filled 2021-06-11 (×4): qty 20

## 2021-06-11 MED ORDER — NOREPINEPHRINE 4 MG/250ML-% IV SOLN
0.0000 ug/min | INTRAVENOUS | Status: DC
  Administered 2021-06-11: 2 ug/min via INTRAVENOUS
  Administered 2021-06-12: 4 ug/min via INTRAVENOUS
  Administered 2021-06-12: 5 ug/min via INTRAVENOUS
  Administered 2021-06-13: 8 ug/min via INTRAVENOUS
  Administered 2021-06-13 – 2021-06-14 (×2): 5 ug/min via INTRAVENOUS
  Administered 2021-06-15 – 2021-06-16 (×3): 2 ug/min via INTRAVENOUS
  Filled 2021-06-11 (×8): qty 250

## 2021-06-11 MED ORDER — METHYLPREDNISOLONE SODIUM SUCC 125 MG IJ SOLR
80.0000 mg | Freq: Two times a day (BID) | INTRAMUSCULAR | Status: DC
  Administered 2021-06-11 – 2021-06-12 (×3): 80 mg via INTRAVENOUS
  Filled 2021-06-11 (×3): qty 2

## 2021-06-11 MED ORDER — PHENYLEPHRINE 40 MCG/ML (10ML) SYRINGE FOR IV PUSH (FOR BLOOD PRESSURE SUPPORT)
PREFILLED_SYRINGE | INTRAVENOUS | Status: AC
  Administered 2021-06-11: 240 ug
  Filled 2021-06-11: qty 10

## 2021-06-11 MED ORDER — ARTIFICIAL TEARS OPHTHALMIC OINT
1.0000 "application " | TOPICAL_OINTMENT | Freq: Three times a day (TID) | OPHTHALMIC | Status: DC
  Administered 2021-06-11 – 2021-06-22 (×34): 1 via OPHTHALMIC
  Filled 2021-06-11 (×5): qty 3.5

## 2021-06-11 MED ORDER — FENTANYL CITRATE PF 50 MCG/ML IJ SOSY
100.0000 ug | PREFILLED_SYRINGE | Freq: Once | INTRAMUSCULAR | Status: AC
  Administered 2021-06-11: 100 ug via INTRAVENOUS
  Filled 2021-06-11: qty 2

## 2021-06-11 MED ORDER — DOCUSATE SODIUM 50 MG/5ML PO LIQD
100.0000 mg | Freq: Two times a day (BID) | ORAL | Status: DC
  Administered 2021-06-12 – 2021-06-24 (×23): 100 mg
  Filled 2021-06-11 (×24): qty 10

## 2021-06-11 MED ORDER — MIDAZOLAM HCL 2 MG/2ML IJ SOLN
2.0000 mg | Freq: Once | INTRAMUSCULAR | Status: AC
  Administered 2021-06-11: 2 mg via INTRAVENOUS

## 2021-06-11 MED ORDER — ROCURONIUM BROMIDE 10 MG/ML (PF) SYRINGE
50.0000 mg | PREFILLED_SYRINGE | INTRAVENOUS | Status: DC | PRN
  Administered 2021-06-13 (×2): 50 mg via INTRAVENOUS
  Filled 2021-06-11: qty 10
  Filled 2021-06-11 (×2): qty 5

## 2021-06-11 MED ORDER — CISATRACURIUM BESYLATE 20 MG/10ML IV SOLN: 10.0000 mg | Freq: Once | INTRAVENOUS | Status: DC

## 2021-06-11 MED ORDER — ALBUTEROL (5 MG/ML) CONTINUOUS INHALATION SOLN: 20.0000 mg | INHALATION_SOLUTION | RESPIRATORY_TRACT | Status: DC

## 2021-06-11 MED ORDER — IPRATROPIUM-ALBUTEROL 0.5-2.5 (3) MG/3ML IN SOLN
3.0000 mL | RESPIRATORY_TRACT | Status: DC
  Administered 2021-06-11: 3 mL via RESPIRATORY_TRACT
  Filled 2021-06-11: qty 3

## 2021-06-11 MED ORDER — PHENYLEPHRINE 40 MCG/ML (10ML) SYRINGE FOR IV PUSH (FOR BLOOD PRESSURE SUPPORT)
PREFILLED_SYRINGE | INTRAVENOUS | Status: AC
  Administered 2021-06-11: 80 ug via INTRAVENOUS
  Filled 2021-06-11: qty 10

## 2021-06-11 MED ORDER — CLONAZEPAM 0.5 MG PO TABS
0.5000 mg | ORAL_TABLET | Freq: Three times a day (TID) | ORAL | Status: DC
Start: 2021-06-11 — End: 2021-06-12

## 2021-06-11 MED ORDER — ROCURONIUM BROMIDE 10 MG/ML (PF) SYRINGE
PREFILLED_SYRINGE | INTRAVENOUS | Status: AC
  Administered 2021-06-11: 50 mg via INTRAVENOUS
  Filled 2021-06-11: qty 10

## 2021-06-11 MED ORDER — MIDAZOLAM HCL 2 MG/2ML IJ SOLN
4.0000 mg | Freq: Once | INTRAMUSCULAR | Status: AC
Start: 2021-06-11 — End: 2021-06-11
  Administered 2021-06-11: 4 mg via INTRAVENOUS
  Filled 2021-06-11: qty 4

## 2021-06-11 MED ORDER — ALBUTEROL (5 MG/ML) CONTINUOUS INHALATION SOLN
20.0000 mg | INHALATION_SOLUTION | RESPIRATORY_TRACT | Status: DC
  Administered 2021-06-11: 20 mg via RESPIRATORY_TRACT
  Filled 2021-06-11: qty 20

## 2021-06-11 MED ORDER — ALBUTEROL (5 MG/ML) CONTINUOUS INHALATION SOLN: 5.0000 mg/h | INHALATION_SOLUTION | RESPIRATORY_TRACT | Status: DC

## 2021-06-11 MED ORDER — MIDAZOLAM HCL 2 MG/2ML IJ SOLN
2.0000 mg | INTRAMUSCULAR | Status: AC | PRN
  Administered 2021-06-11 – 2021-06-13 (×3): 2 mg via INTRAVENOUS
  Filled 2021-06-11 (×3): qty 2

## 2021-06-11 MED ORDER — FENTANYL BOLUS VIA INFUSION
50.0000 ug | INTRAVENOUS | Status: DC | PRN
  Administered 2021-06-12 – 2021-06-13 (×3): 100 ug via INTRAVENOUS
  Administered 2021-06-14 – 2021-06-15 (×2): 50 ug via INTRAVENOUS
  Administered 2021-06-15: 150 ug via INTRAVENOUS
  Administered 2021-06-15 (×3): 50 ug via INTRAVENOUS
  Administered 2021-06-18 (×4): 100 ug via INTRAVENOUS
  Administered 2021-06-18 – 2021-06-19 (×2): 50 ug via INTRAVENOUS
  Administered 2021-06-19: 100 ug via INTRAVENOUS
  Administered 2021-06-20: 50 ug via INTRAVENOUS
  Administered 2021-06-20: 25 ug via INTRAVENOUS
  Filled 2021-06-11: qty 100

## 2021-06-11 MED ORDER — ALBUTEROL SULFATE 2.5 MG/0.5ML IN NEBU
20.0000 mg | INHALATION_SOLUTION | RESPIRATORY_TRACT | Status: AC
  Administered 2021-06-11: 20 mg via RESPIRATORY_TRACT
  Filled 2021-06-11 (×4): qty 4

## 2021-06-11 MED ORDER — PHENYLEPHRINE 40 MCG/ML (10ML) SYRINGE FOR IV PUSH (FOR BLOOD PRESSURE SUPPORT)
PREFILLED_SYRINGE | INTRAVENOUS | Status: AC
  Administered 2021-06-11: 200 ug via INTRAVENOUS
  Filled 2021-06-11: qty 10

## 2021-06-11 MED ORDER — POLYETHYLENE GLYCOL 3350 17 G PO PACK
17.0000 g | PACK | Freq: Every day | ORAL | Status: DC
  Administered 2021-06-13 – 2021-06-21 (×8): 17 g
  Filled 2021-06-11 (×9): qty 1

## 2021-06-11 MED ORDER — ALBUTEROL SULFATE (2.5 MG/3ML) 0.083% IN NEBU: 2.5000 mg | INHALATION_SOLUTION | RESPIRATORY_TRACT | Status: DC | PRN

## 2021-06-11 MED ORDER — ALBUTEROL SULFATE (2.5 MG/3ML) 0.083% IN NEBU
2.5000 mg | INHALATION_SOLUTION | RESPIRATORY_TRACT | Status: DC
  Administered 2021-06-11 – 2021-06-12 (×4): 2.5 mg via RESPIRATORY_TRACT
  Filled 2021-06-11 (×4): qty 3

## 2021-06-11 MED ORDER — ROCURONIUM BROMIDE 50 MG/5ML IV SOLN
80.0000 mg | Freq: Once | INTRAVENOUS | Status: AC
  Filled 2021-06-11: qty 8

## 2021-06-11 MED ORDER — ALBUTEROL SULFATE (2.5 MG/3ML) 0.083% IN NEBU: 2.5000 mg | INHALATION_SOLUTION | RESPIRATORY_TRACT | Status: DC

## 2021-06-11 NOTE — Progress Notes (Addendum)
NAME:  Kathleen Cameron, MRN:  [id number], DOB:  09/06/1965, LOS: 1 ADMISSION DATE:  05/31/2021, CONSULTATION DATE:  2/20 REFERRING MD:  Ralene Bathe , CHIEF COMPLAINT:  chest pain   History of Present Illness:  Kathleen Cameron, is a 56 y.o. female, who presented to the [facility] ED with a chief complaint of chest pain  They have a pertinent past medical history of severe COPD, Chronic respiratory failure with tracheostomy in place, hypertension, anxiety.  Patient of Dr. Lamonte Sakai.  Prior to arrival, patient recently had change in ventilation from tidal volume of 480 down to tidal volume of 400.  Patient has presented to the emergency department 3 times in the past 24 hours.  Was discharged from the emergency department at 1500 after presenting with abdominal pain.  CT chest abdomen pelvis negative for new/acute abnormality within the chest, abdomen, or pelvis. presented again at 71 with chest pain  ED course was notable for stable troponin (18> 14), D-dimer slightly elevated CTA chest obtained, negative for PE, concern on CT for acute airway narrowing and opacification at the left mainstem bronchus, left lower lobar bronchus, and lower subsegmental lower lobe airways.   Of note, home ventilator power cord was lost during transport. Home vent out of battery. EMS contacted to search for cord.   PCCM was consulted for admission.    Pertinent  Medical History  severe COPD, Chronic respiratory failure with tracheostomy in place, hypertension, anxiety  Significant Hospital Events: Including procedures, antibiotic start and stop dates in addition to other pertinent events   2/19-20: presented to ED x3 times. Abd pain: CT chest/abd/pelvis negative for acute process. CT chest: concern for L airway narrowing.  2/21 restless and anxious on vent overnight, received one dose paralytic and fentanyl   Imaging  Chest x-ray: Stable trach no pneumo, pulmonary edema, acute airspace disease. Emphysema CTA chest:  No PE.  Acute airway narrowing and opacification in the left mainstem bronchus and the left lower bronchus.  Suspect mucous plugging. Twelve-lead: Sinus rhythm, left anterior fascicular block, no ST changes noted    Micro  2/20 Covid/Flu>neg 2/21 RVP> 2/20 Respiratory cs>neg  Abx:  2/20 Azith 2/20 Doxy  Interim History / Subjective:   Chest pain and increased WOB overnight, trop negative and repeat CXR essentially unchanged, Received Versed, but remained anxious and restless,  Different Vent modes attempted, did improved with Fentanyl and paralytic.  Restless again this morning   Objective   Blood pressure (!) 157/82, pulse (!) 111, temperature 98.6 F (37 C), temperature source Oral, resp. rate 17, weight 51.3 kg, SpO2 100 %.    Vent Mode: PRVC FiO2 (%):  [50 %-60 %] 50 % Set Rate:  [15 bmp-25 bmp] 15 bmp Vt Set:  [400 mL-450 mL] 440 mL PEEP:  [5 cmH20] 5 cmH20 Plateau Pressure:  [15 YME15-83 cmH20] 18 cmH20   Intake/Output Summary (Last 24 hours) at 06/11/2021 0940 Last data filed at 06/11/2021 0400 Gross per 24 hour  Intake 2118.29 ml  Output 1600 ml  Net 518.29 ml   Filed Weights   06/11/21 0500  Weight: 51.3 kg       General:  chronically ill-appearing F, appears acutely uncomfortable and restless on vent HEENT: MM pink/moist, sclera anicteric, pupils equal Neuro: examined on Precedex, opens eyes to voice, trying to sit up, uncomfortable appearing, spontaneously moving extremities but not following commands CV: s1s2 rrr, no m/r/g PULM:  decreased air entry throughout without significant wheezing or rhonchi, TV 550, PEEP 5, 50%, moderate  thin, white secretions suctioned  GI: soft, mildly distended,  bsx4 active, prior peg placement site dressed with minimal drainage Extremities: warm/dry, no edema, no skin mottling  Skin: no rashes or lesions    Significant labs Lab Data (Last 48 hours)   02/21 0404 02/21 0654      pH, Arterial 7.222 Low     7.223 Low          pCO2 arterial 95.2 High Panic      90.7 High Panic          pO2, Arterial 96    121 High         WBC 13k Trop 11   Resolved Hospital Problem list     Assessment & Plan:     Acute on Chronic hypercarbic respiratory failure likely secondary to COPD exacerbation Baseline End stage COPD trach and vent dependent POA Home vent settings 400, RR 16, Peep 5, 02 2-6L, pt of Dr Lamonte Sakai.  Pt's husband is at the bedside, states that she had been doing fine until [place] home health made vent changes in 2/17, says she immediately "didn't feel right" but was not in respiratory distress.  She had intermittent abdominal and CP after that with runny nose.  -restless overnight and hypercarbic this AM.   On Precedex this AM, give prn Fentanyl.  When TV decreased and rr increased pt appeared air hungry, returned to previous setting TV 550 and RR 15, repeat ABG in a few hours -has L mainstem narrowing and opacification, plan for bedside bronch today -extended RVP -continue CAP coverage with Azith and ceftriaxone, follow respiratory culture, continue Pulmicort, Brovana, Yupelri, duonebs nebs and solumedrol $RemoveBefore'80mg'[id number]$  q12 -may need TOC support if unable to find home vent cord (may have lost) -due for trach change, follows with [name] at trach clinic,  discussed and will plan for trach exchange before discharge, but wait until pt is more comfortable  -[facility], appears TV was lowered from 480 to 400 last week, unclear why     Chest pain Troponin stable, twelve-lead note ST changes, believes that it may be due to to airway narrowing seen on CT scan or recent ventilatory changes requiring time for adjustment. -Continuous telemetry -Trop unremarkable  Abdominal pain CT abdomen negative for acute process.  History of PEG tube.  Lipase normal. - continue to Monitor  PEG tube site wound -WOC consult  Hypothyroidism -continue  synthroid. Clarifying how patient takes medications without peg  tube.  Chronic, normocytic anemia No bleeding noted, ?nutrition related -Transfuse PRBC if HBG less than 7 -follow CBC -Monitor for signs of bleeding  HX Hypertension -hold amlodipine and pressure soft in the setting of sedation  Best Practice (right click and "Reselect all SmartList Selections" daily)   Diet/type: NPO DVT prophylaxis: prophylactic heparin  GI prophylaxis: PPI Lines: N/A Foley:  N/A Code Status:  full code Last date of multidisciplinary goals of care discussion [husband updated at the bedside 2/21]  Labs   CBC: Recent Labs  Lab 06/08/21 1635 06/10/21 0042 06/10/21 0048 06/11/21 0014 06/11/21 0404 06/11/21 0654  WBC 11.9* 6.6  --  13.1*  --   --   NEUTROABS 10.1* 4.8  --   --   --   --   HGB 11.8* 11.3* 12.6 11.8* 12.9 12.6  HCT 38.4 35.8* 37.0 39.7 38.0 37.0  MCV 89.5 86.7  --  89.4  --   --   PLT 334 239  --  334  --   --  Basic Metabolic Panel: Recent Labs  Lab 06/08/21 1635 06/10/21 0042 06/10/21 0048 06/10/21 1351 06/11/21 0014 06/11/21 0404 06/11/21 0654  NA 137 134* 136 140 136 140 140  K 4.0 4.2 4.4 3.8 4.2 4.1 3.5  CL 94* 90*  --  93* 94*  --   --   CO2 34* 33*  --  37* 32  --   --   GLUCOSE 254* 98  --  153* 158*  --   --   BUN 15 11  --  13 12  --   --   CREATININE 0.97 0.92  --  0.95 0.98  --   --   CALCIUM 10.0 9.8  --  10.1 10.0  --   --   MG  --   --   --  1.9 2.4  --   --   PHOS  --   --   --   --  3.9  --   --     GFR: Estimated Creatinine Clearance: 51.3 mL/min (by C-G formula based on SCr of 0.98 mg/dL). Recent Labs  Lab 06/08/21 1635 06/10/21 0042 06/11/21 0014  WBC 11.9* 6.6 13.1*     Liver Function Tests: Recent Labs  Lab 06/08/21 1635 06/10/21 0042  AST 37 41  ALT 19 17  ALKPHOS 186* 138*  BILITOT 0.3 0.9  PROT 9.7* 8.3*  ALBUMIN 4.3 3.8    Recent Labs  Lab 06/08/21 1635 06/10/21 0042  LIPASE 26 25    No results for input(s): AMMONIA in the last 168 hours.  ABG    Component  Value Date/Time   PHART 7.223 (L) 06/11/2021 0654   PCO2ART 90.7 (HH) 06/11/2021 0654   PO2ART 121 (H) 06/11/2021 0654   HCO3 37.5 (H) 06/11/2021 0654   TCO2 40 (H) 06/11/2021 0654   O2SAT 98 06/11/2021 0654      Coagulation Profile: No results for input(s): INR, PROTIME in the last 168 hours.  Cardiac Enzymes: No results for input(s): CKTOTAL, CKMB, CKMBINDEX, TROPONINI in the last 168 hours.  HbA1C: Hgb A1c MFr Bld  Date/Time Value Ref Range Status  06/21/2020 02:24 AM 5.3 4.8 - 5.6 % Final    Comment:    (NOTE) Pre diabetes:          5.7%-6.4%  Diabetes:              >6.4%  Glycemic control for   <7.0% adults with diabetes   02/26/2020 05:18 PM 5.4 4.8 - 5.6 % Final    Comment:    (NOTE) Pre diabetes:          5.7%-6.4%  Diabetes:              >6.4%  Glycemic control for   <7.0% adults with diabetes     CBG: No results for input(s): GLUCAP in the last 168 hours.  Review of Systems:   Positives in bold  Gen: fever, chills, weight change, fatigue, night sweats HEENT:  blurred vision, double vision, hearing loss, tinnitus, sinus congestion, rhinorrhea, sore throat, neck stiffness, dysphagia PULM:  shortness of breath, cough, sputum production, hemoptysis, wheezing CV: chest pain, edema, orthopnea, paroxysmal nocturnal dyspnea, palpitations GI:  abdominal pain, nausea, vomiting, diarrhea, hematochezia, melena, constipation, change in bowel habits GU: dysuria, hematuria, polyuria, oliguria, urethral discharge Endocrine: hot or cold intolerance, polyuria, polyphagia or appetite change Derm: rash, dry skin, scaling or peeling skin change Heme: easy bruising, bleeding, bleeding gums Neuro: headache, numbness, weakness, slurred speech, loss  of memory or consciousness   Past Medical History:  She,  has a past medical history of Acute on chronic respiratory failure with hypoxia ([place]), Anxiety disorder, Asthma, COPD (chronic obstructive pulmonary disease) ([place]),  COPD, severe ([place]), Hypertension, and Seizure disorder ([place]).   Surgical History:   Past Surgical History:  Procedure Laterality Date   COLONOSCOPY N/A 06/26/2020   Procedure: COLONOSCOPY;  Surgeon: Carol Ada, MD;  Location: [place];  Service: Endoscopy;  Laterality: N/A;   ENTEROSCOPY N/A 06/26/2020   Procedure: ENTEROSCOPY;  Surgeon: Carol Ada, MD;  Location: [place];  Service: Endoscopy;  Laterality: N/A;   HEMOSTASIS CLIP PLACEMENT  06/26/2020   Procedure: HEMOSTASIS CLIP PLACEMENT;  Surgeon: Carol Ada, MD;  Location: [place];  Service: Endoscopy;;   HOT HEMOSTASIS N/A 06/26/2020   Procedure: HOT HEMOSTASIS (ARGON PLASMA COAGULATION/BICAP);  Surgeon: Carol Ada, MD;  Location: [place];  Service: Endoscopy;  Laterality: N/A;   IR FLUORO RM 30-60 MIN  03/23/2020   IR REPLC GASTRO/COLONIC TUBE PERCUT W/FLUORO  09/12/2020   IR REPLC GASTRO/COLONIC TUBE PERCUT W/FLUORO  12/26/2020   LAPAROSCOPIC INSERTION GASTROSTOMY TUBE N/A 04/24/2020   Procedure: LAPAROSCOPIC GASTROSTOMY TUBE PLACEMENT;  Surgeon: Mickeal Skinner, MD;  Location: [place];  Service: General;  Laterality: N/A;   POLYPECTOMY  06/26/2020   Procedure: POLYPECTOMY;  Surgeon: Carol Ada, MD;  Location: [place];  Service: Endoscopy;;   WISDOM TOOTH EXTRACTION       Social History:   reports that she has never smoked. She has never used smokeless tobacco. She reports current alcohol use of about 2.0 standard drinks per week. She reports that she does not use drugs.   Family History:  Her Family history is unknown by patient.   Allergies Allergies  Allergen Reactions   Stiolto Respimat [Tiotropium Bromide-Olodaterol] Other (See Comments)    Severe headaches and rapid heartrate     Home Medications  Prior to Admission medications   Medication Sig Start Date End Date Taking? Authorizing Provider  acetaminophen (TYLENOL) 325 MG tablet Take 325 mg by mouth every 6 (six) hours as needed for  moderate pain or headache.   Yes [provider]  amLODipine (NORVASC) 5 MG tablet Take 5 mg by mouth daily. 05/21/21  Yes [provider]  arformoterol (BROVANA) 15 MCG/2ML NEBU INHALE 2 MLS(1 VIAL) VIA NEBULIZATION TWICE DAILY Patient taking differently: Take 15 mcg by nebulization in the morning and at bedtime. 11/16/20  Yes Collene Gobble, MD  azithromycin (ZITHROMAX) 250 MG tablet Take 1 tablet by mouth once a day Patient taking differently: Take 250 mg by mouth daily. Continuously 05/24/21  Yes Collene Gobble, MD  bisacodyl (DULCOLAX) 10 MG suppository Place 1 suppository (10 mg total) rectally daily as needed for severe constipation. 07/10/20  Yes Sagardia, Ines Bloomer, MD  budesonide (PULMICORT) 0.5 MG/2ML nebulizer solution Take 0.5 mg by nebulization in the morning and at bedtime. 05/24/21  Yes [provider]  chlorhexidine gluconate, MEDLINE KIT, (PERIDEX) 0.12 % solution 15 mLs by Mouth Rinse route 2 (two) times daily. 02/07/21  Yes Sagardia, Ines Bloomer, MD  clonazePAM (KLONOPIN) 1 MG tablet PLACE 1/2 TABLET INTO FEEDING TUBE THREE TIMES DAILY AS NEEDED FOR ANXIETY Patient taking differently: Take 0.5 mg by mouth 3 (three) times daily as needed for anxiety. 12/06/20  Yes Sagardia, Ines Bloomer, MD  clotrimazole-betamethasone (LOTRISONE) cream Apply 1 application topically 2 (two) times daily. Patient taking differently: Apply 1 application topically daily. 01/10/21  Yes Sagardia,  Ines Bloomer, MD  hydrOXYzine (ATARAX) 25 MG tablet TAKE 1 TABLET(25 MG) BY MOUTH AT BEDTIME AS NEEDED Patient taking differently: Take 25 mg by mouth at bedtime as needed for anxiety (sleep). 05/20/21  Yes Sagardia, Ines Bloomer, MD  ipratropium-albuterol (DUONEB) 0.5-2.5 (3) MG/3ML SOLN INHALE CONTENT OF 1 VIAL PER NEBULIZER EVERY 4 HOURS Patient taking differently: Take 3 mLs by nebulization every 4 (four) hours as needed (shortness of breath). 02/12/21  Yes Collene Gobble, MD   levothyroxine (SYNTHROID) 88 MCG tablet TAKE 1 TABLET BY MOUTH EVERY DAY AT 6 AM Patient taking differently: Take 88 mcg by mouth daily before breakfast. 04/19/21  Yes Sagardia, Ines Bloomer, MD  lip balm (CARMEX) ointment Apply topically as needed for lip care. Patient taking differently: Apply 1 application topically as needed for lip care. 03/08/20  Yes Candee Furbish, MD  metoCLOPramide (REGLAN) 10 MG tablet Take 1 tablet (10 mg total) by mouth every 8 (eight) hours as needed for nausea. 04/10/21 07/09/21 Yes Sagardia, Ines Bloomer, MD  mupirocin ointment (BACTROBAN) 2 % Apply 1 application topically 2 (two) times daily. 04/10/21  Yes Sagardia, Ines Bloomer, MD  Nutritional Supplements (ENSURE ACTIVE HIGH PROTEIN) LIQD Take 1 Can by mouth in the morning and at bedtime. 07/09/20  Yes Ghimire, Dante Gang, MD  pantoprazole (PROTONIX) 40 MG tablet TAKE 1 TABLET(40 MG) BY MOUTH DAILY Patient taking differently: Take 40 mg by mouth daily. 05/13/21  Yes Sagardia, Ines Bloomer, MD  phenol (CHLORASEPTIC) 1.4 % LIQD Use as directed 1 spray in the mouth or throat as needed for throat irritation / pain. 03/08/20  Yes Candee Furbish, MD  polyethylene glycol (MIRALAX / GLYCOLAX) 17 g packet Place 17 g into feeding tube daily as needed for mild constipation. 03/08/20  Yes Candee Furbish, MD  revefenacin [facility]) 175 MCG/3ML nebulizer solution INHALE 3 MLS(1 VIAL) VIA NEBULIZATION ONCE DAILY Patient taking differently: Take 175 mcg by nebulization daily. 03/29/21  Yes Collene Gobble, MD  rosuvastatin (CRESTOR) 20 MG tablet Take 20 mg by mouth at bedtime. 05/14/21  Yes [provider]  senna-docusate (SENOKOT-S) 8.6-50 MG tablet Place 2 tablets into feeding tube at bedtime as needed for moderate constipation. Patient taking differently: Take 2 tablets by mouth at bedtime as needed for moderate constipation. 11/27/20  Yes McDonald, Mia A, PA-C  Simethicone 125 MG CAPS Give 125 mg by tube daily.   Yes [provider]  sodium chloride (OCEAN) 0.65 % SOLN nasal spray Place 1 spray into both nostrils as needed for congestion. 03/08/20  Yes Candee Furbish, MD  sorbitol 70 % SOLN Place 30 mLs into feeding tube daily as needed for moderate constipation. Patient taking differently: Take 30 mLs by mouth daily as needed for moderate constipation. 07/25/20  Yes Candee Furbish, MD  traMADol (ULTRAM) 50 MG tablet TAKE 1 TABLET(50 MG) BY MOUTH THREE TIMES DAILY AS NEEDED Patient taking differently: Take 50 mg by mouth 3 (three) times daily as needed for moderate pain. 05/23/21  Yes Horald Pollen, MD  venlafaxine (EFFEXOR) 37.5 MG tablet PLACE 1 TABLET INTO FEEDING TUBE 2 TIMES A DAY WITH A MEAL Patient taking differently: Take 37.5 mg by mouth 2 (two) times daily with a meal. 01/13/21  Yes Sagardia, Ines Bloomer, MD  amLODipine (NORVASC) 10 MG tablet Place 1 tablet (10 mg total) into feeding tube daily. Patient not taking: Reported on 06/10/2021 09/18/20   Mercy Riding, MD  atorvastatin (LIPITOR) 40 MG tablet  Take 1 tablet (40 mg total) by mouth at bedtime. Patient not taking: Reported on 06/10/2021 02/10/21 05/11/21  Horald Pollen, MD  budesonide (PULMICORT) 0.25 MG/2ML nebulizer solution Take 2 mLs (0.25 mg total) by nebulization 2 (two) times daily. Patient not taking: Reported on 06/10/2021 01/25/21   Collene Gobble, MD  Chlorhexidine Gluconate Cloth 2 % PADS Apply 6 each topically daily. Patient not taking: Reported on 06/10/2021 08/21/20   Horald Pollen, MD  doxycycline (MONODOX) 100 MG capsule Take 1 capsule (100 mg total) by mouth 2 (two) times daily. Patient not taking: Reported on 06/10/2021 11/15/20   Binnie Rail, MD  ELIQUIS 5 MG TABS tablet PLACE 1 TABLET INTO FEEDING TUBE TWICE DAILY Patient not taking: Reported on 06/10/2021 12/17/20   Horald Pollen, MD  Nutritional Supplements (FEEDING SUPPLEMENT, OSMOLITE 1.2 CAL,) LIQD Place 1,000 mLs into feeding tube  continuous. Patient not taking: Reported on 06/10/2021 07/10/20   Horald Pollen, MD  simethicone [facility]) 40 YT/1.1NB drops Place 0.6 mLs (40 mg total) into feeding tube every 6 (six) hours. Patient not taking: Reported on 06/10/2021 09/18/20   Mercy Riding, MD  temazepam (RESTORIL) 15 MG capsule Place 1 capsule (15 mg total) into feeding tube at bedtime as needed for sleep. Patient not taking: Reported on 06/10/2021 10/29/20   Horald Pollen, MD     Critical care time: 35 minutes    CRITICAL CARE Performed by: Otilio Carpen Wanona Stare   Total critical care time: 35 minutes  Critical care time was exclusive of separately billable procedures and treating other patients.  Critical care was necessary to treat or prevent imminent or life-threatening deterioration.  Critical care was time spent personally by me on the following activities: development of treatment plan with patient and/or surrogate as well as nursing, discussions with consultants, evaluation of patient's response to treatment, examination of patient, obtaining history from patient or surrogate, ordering and performing treatments and interventions, ordering and review of laboratory studies, ordering and review of radiographic studies, pulse oximetry and re-evaluation of patient's condition.    Otilio Carpen Amalya Salmons, PA-C [facility] See Amion for pager If no response to pager , please call [phone] until 7pm After 7:00 pm call Elink  [phone]

## 2021-06-11 NOTE — Progress Notes (Signed)
Initial Nutrition Assessment  DOCUMENTATION CODES:   Not applicable  INTERVENTION:   Consider Cortrak placement tomorrow if unable to advance diet  Tube Feeding Recommendations:  Osmolite 1.2 at 60 ml/hr This provides 80 g of protein, 1728 kcals and 1166 mL of free water  NUTRITION DIAGNOSIS:   Inadequate oral intake related to acute illness as evidenced by NPO status.  GOAL:   Patient will meet greater than or equal to 90% of their needs  MONITOR:   Diet advancement, Labs, Weight trends  REASON FOR ASSESSMENT:   Ventilator    ASSESSMENT:   56 yo female admitted with acute on chronic respiratory failure, vent/trach at baseline. PMH includes severe COPD, chronic respiratory failure-vent/trach at home, anxiety, HTN  2/20 Admit 2/21 Bronch  Husband not present on assessment today. Pt in acute distress on visit today, respiratory and CCM working with pt. Unable to complete nutrition focused physical exam.   Noted pt requiring paralytic for vent synchrony, started on propofol today  NPO SLP consulted but swallow eval on hold for now  Noted PTA pt eating and regular diet with thin liquids at home. Pt rarely uses inline PMV per SLP noted.   Pt with hx of PEG tube which was removed, now with PEG tube site wound  Noted pt last seen by inpatient RD team in June 2022; at this time, pt eating regular diet and receiving supplemental TF via PEG  Based on current weight, does not appear to have much wt loss  No pressure injuries noted per RN skin assessment.   Unsure of pt's baseline physical function. Per RN, pt is connected to vent 24/7 but unsure if pt gets up to bathroom, etc  Labs: reviewed Meds: colace, solumedrol, miralax  Diet Order:   Diet Order             Diet NPO time specified  Diet effective midnight                   EDUCATION NEEDS:   Not appropriate for education at this time  Skin:  Skin Assessment: Reviewed RN Assessment (no pressure  injuries noted)  Last BM:  PTA  Height:   Ht Readings from Last 1 Encounters:  05/25/2021 5\' 5"  (1.651 m)    Weight:   Wt Readings from Last 1 Encounters:  06/11/21 51.3 kg    BMI:  Body mass index is 18.82 kg/m.  Estimated Nutritional Needs:   Kcal:  1600-1800 kcals  Protein:  80-90 g  Fluid:  >/= 1.6 L   06/13/21 MS, RDN, LDN, CNSC Registered Dietitian III Clinical Nutrition RD Pager and On-Call Pager Number Located in [place]

## 2021-06-11 NOTE — Procedures (Signed)
Arterial Catheter Insertion Procedure Note  Kathleen Cameron  [id number]  March 28, 1966  Date:06/11/21  Time:2:19 PM    Provider Performing: Darcella Gasman Ronte Parker    Procedure: Insertion of Arterial Line (35597) with US guidance (41638)   Indication(s) Blood pressure monitoring and/or need for frequent ABGs  Consent Unable to obtain consent due to emergent nature of procedure.  Anesthesia None   Time Out Verified patient identification, verified procedure, site/side was marked, verified correct patient position, special equipment/implants available, medications/allergies/relevant history reviewed, required imaging and test results available.   Sterile Technique Maximal sterile technique including full sterile barrier drape, hand hygiene, sterile gown, sterile gloves, mask, hair covering, sterile ultrasound probe cover (if used).   Procedure Description Area of catheter insertion was cleaned with chlorhexidine and draped in sterile fashion. With real-time ultrasound guidance an arterial catheter was placed into the right radial artery.  Appropriate arterial tracings confirmed on monitor.     Complications/Tolerance None; patient tolerated the procedure well.   EBL Minimal   Specimen(s) None  Darcella Gasman Zahlia Deshazer, PA-C

## 2021-06-11 NOTE — Progress Notes (Signed)
Patient sat up straight in bed freaking out and started clamping down causing the peak pressures to reach 57 and was only getting 300 VTs vs the 550 she is set to get. CPT stopped. RN adjusting meds. PA made aware.

## 2021-06-11 NOTE — Progress Notes (Signed)
SLP Cancellation Note  Patient Details Name: Kathleen Cameron MRN: [id number] DOB: 21-Jan-1966   Cancelled treatment:       Reason Eval/Treat Not Completed: Fatigue/lethargy limiting ability to participate. Pt poorly rousable this am; d/w RN, Judson Roch.    Pt known to SLP services from prior admissions.  Spoke with her husband at bedside, who reports his wife continues to eat a regular diet/thin liquids at baseline. Rarely uses inline PMV.   Per RN, instrumental swallow study is preferable at this time.  D/W Mr. Narro that it makes sense to proceed with FEES once she wakes up.  He agrees with plan.  Cherylee Rawlinson L. Tivis Ringer, [place] Office number [phone] Pager [phone]   Assunta Curtis 06/11/2021, 10:53 AM

## 2021-06-11 NOTE — Progress Notes (Signed)
Per MD verbal order

## 2021-06-11 NOTE — Progress Notes (Signed)
PCCM Progress Note  Called to bedside for increased peak pressures and respiratory distress after start chest physiotherapy. Patient examined with SpO2 96% however appears uncomfortable and tachypneic. IV sedation added. May need to start paralytics. 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 Even with intervention such as a stent, the location of the EDAC makes this procedure unlikely to be successful in the long-term.  Will continue full vent support Will discuss with additional PCCM team for alternative vent management Plan to repeat bronch to evaluate airway anatomy  Critical care time: 35 min  Mechele Collin, M.D. [facility] 06/11/2021 1:14 PM   See Amion for personal pager For hours between 7 PM to 7 AM, please call Elink for urgent questions

## 2021-06-11 NOTE — Plan of Care (Signed)
Plan of care:  Discussed patient's worsening status with husband Earvin Hansen and am concerned about her ability to return to baseline.  He voiced understanding and would like to continue all interventions at this time, though expressed he did not want to put her through unnecessary suffering.   Darcella Gasman Moana Munford, PA-C

## 2021-06-11 NOTE — Procedures (Signed)
Central Venous Catheter Insertion Procedure Note  Linlee Cromie  [id number]  1965-11-16  Date:06/11/21  Time:1:13 PM   Provider Performing:Nicol Herbig R Alizza Sacra   Procedure: Insertion of Non-tunneled Central Venous [phone]) with US guidance ([id number])   Indication(s) Medication administration  Consent Risks of the procedure as well as the alternatives and risks of each were explained to the patient and/or caregiver.  Consent for the procedure was obtained and is signed in the bedside chart  (spouse via phone)  Anesthesia Topical only with 1% lidocaine   Timeout Verified patient identification, verified procedure, site/side was marked, verified correct patient position, special equipment/implants available, medications/allergies/relevant history reviewed, required imaging and test results available.  Sterile Technique Maximal sterile technique including full sterile barrier drape, hand hygiene, sterile gown, sterile gloves, mask, hair covering, sterile ultrasound probe cover (if used).  Procedure Description Area of catheter insertion was cleaned with chlorhexidine and draped in sterile fashion.  With real-time ultrasound guidance a central venous catheter was placed into the right femoral vein. Nonpulsatile blood flow and easy flushing noted in all ports.  The catheter was sutured in place and sterile dressing applied.  Complications/Tolerance None; patient tolerated the procedure well. Chest X-ray is ordered to verify placement for internal jugular or subclavian cannulation.   Chest x-ray is not ordered for femoral cannulation.  EBL Minimal  Specimen(s) None   Darcella Gasman Taralee Marcus, PA-C

## 2021-06-11 NOTE — Progress Notes (Addendum)
Cross covering ICU physician.   Called to bedside for continued tachypnea and anxiety despite earlier interventions.   I personally tried different modalities of ventilation with pc, ps/cpap, increase tv, decreased Ti to lengthen expiratory phase, decrease rr no change in her wob despite full support.   Not moving air, faint wheeze agree with albuterol neb and steroids.  2nd cxr of the evening remains stable. Do see some narrowing of airways on CT scan.   Possibly could benefit from smaller trach size,  Will attempt to increase sedation and follow mechanics if this doesn't seem to help will give 1x dose paralytic to eliminate pt work and see if mechanics improve.  RN and RT at bedside and updated    42 min of additional cc time excluding procedures.   Phone call placed and update husband he will be ecoming to hospital this am and would like to speak with a provider if possible to readdress pt's trach

## 2021-06-11 NOTE — Progress Notes (Signed)
SLP Cancellation Note  Patient Details Name: Kathleen Cameron MRN: [id number] DOB: [birth year]   Cancelled treatment:        Spoke with Dr. Everardo All who recommended we hold on FEES for now; PCCM will re-order when appropriate. I spoke with [name] and explained that we will defer swallow assessment for now. He verbalized understanding.  Pasty Manninen L. Samson Frederic, MA CCC/SLP Acute Rehabilitation Services Office number [phone] Pager [phone]   Carolan Shiver 06/11/2021, 1:35 PM

## 2021-06-11 NOTE — Progress Notes (Signed)
PEEP was increased to +15 to decrease driving pressure. Dr. Katrinka Blazing and Dr. Everardo All were both made aware of vent changes. Both MD's were okay with all vent changes that were made. ABG ordered for 18:30.

## 2021-06-11 NOTE — Progress Notes (Signed)
Pt very anxious at this time.  RT holding CPT until she is more calm.  First CPT tonight seems to have started her anxiety and she has required further medication for it since then.  During last therapy, pt was completely asleep/sedated and tolerated CPT well but at this time she is desynchronous with vent and sitting straight up in bed, very uncomfortable and more labored in her breathing. RT will continue to monitor.

## 2021-06-11 NOTE — Procedures (Signed)
Bronchoscopy Procedure Note  Takeisha Cianci  [id number]  1965/10/26  Date:06/11/21  Time:8:56 AM   Provider Performing:Raeli Wiens Rodman Pickle, MD   Procedure(s):  Flexible Bronchoscopy [phone]), Flexible bronchoscopy with bronchial alveolar lavage [phone]), and Initial Therapeutic Aspiration of Tracheobronchial Tree (70929)  Indication(s) Acute hypoxemic respiratory failure  Post-Procedure Diagnosis Excessive dynamic airway collapse Tracheomalacia Mucopurulent secretions s/p aspiration  Consent Risks of the procedure as well as the alternatives and risks of each were explained to the patient and/or caregiver.  Consent for the procedure was obtained and is signed in the bedside chart  Anesthesia Fentanyl 150 mcg Versed 2 mg  Time Out Verified patient identification, verified procedure, site/side was marked, verified correct patient position, special equipment/implants available, medications/allergies/relevant history reviewed, required imaging and test results available.   Sterile Technique Usual hand hygiene, masks, gowns, and gloves were used   Procedure Description Bronchoscope advanced through tracheostomy tube and into airway.  Airways were examined down to subsegmental level with findings noted below.   Following diagnostic evaluation, BAL(s) performed in RML with normal saline and return of 5cc cloudy fluid  Findings: Normal anatomy. Erythematous and boggy mucosa with mucopurulent secretions bilaterally that was suctioned and cleared from airways. BAL performed on RML with 60 cc with 5 cc return   Complications/Tolerance Intra and post-procedural hypotension requiring neo pushes. Intra procedure given 240 mcg of neo for SBP in 60-70s. Bronchoscop was withdrawn until blood pressure normalized. Post procedure given 120 mcg of neo. Post-procedure BP checks and close monitoring in the ICU  Chest X-ray is not needed post procedure.   EBL Minimal   Specimen(s) BAL of  RML

## 2021-06-11 NOTE — Progress Notes (Addendum)
eLink Physician-Brief Progress Note Patient Name: Nanda Bittick DOB: Oct 16, 1965 MRN: [id number]   Date of Service  06/11/2021  HPI/Events of Note  Still having increased WOB in spite of sedation. Not moving much air on physical exam. ABG on 50%/PRVC 16/TV 400/P 5 = 7.222/95.2/96/39.5.  eICU Interventions  Plan: Add Solumedrol 40 mg IV now and Q 6 hours. Continuous Albuterol nebs. Increase PRVC rate to 20. Will ask PCCM ground team to evaluate the patient again at bedside.      Intervention Category Major Interventions: Other:  Lenell Antu 06/11/2021, 4:46 AM

## 2021-06-11 NOTE — Progress Notes (Signed)
This RN and additional RN made several attempts at placing NG tube to be able to give per tube meds. All attempts unsuccessful. MD notified. Will hold any per tube meds for now. Likely plan for cortrak tomorrow.

## 2021-06-11 NOTE — TOC Progression Note (Signed)
Transition of Care [facility]) - Progression Note    Patient Details  Name: Kathleen Cameron MRN: [id number] Date of Birth: 1965-06-30  Transition of Care [facility]) CM/SW Contact  Graves-Bigelow, Lamar Laundry, RN Phone Number: 06/11/2021, 4:39 PM  Clinical Narrative:  Case Manager spoke with spouse this morning and he reports that his spouse has vent via [employer]. Spouse states he has the power cord for the vent. Case Manager will continue to follow for additional transition of care needs.    Expected Discharge Plan: Home w Home Health Services Barriers to Discharge: Continued Medical Work up  Expected Discharge Plan and Services Expected Discharge Plan: Home w Home Health Services In-house Referral: NA Discharge Planning Services: CM Consult Post Acute Care Choice: Home Health, Resumption of Svcs/PTA Provider Living arrangements for the past 2 months: Apartment                   DME Agency: NA         HH Agency: [facility] (Adult side) Date HH Agency Contacted: 06/10/21 Time HH Agency Contacted: 1643 Representative spoke with at [facility] Agency: Rob   Readmission Risk Interventions Readmission Risk Prevention Plan 06/10/2021 02/02/2020  Transportation Screening Complete Complete  PCP or Specialist Appt within 3-5 Days - Complete  HRI or Home Care Consult - Complete  Social Work Consult for Recovery Care Planning/Counseling - Complete  Palliative Care Screening - Not Applicable  Medication Review Oceanographer) Complete Complete  HRI or Home Care Consult Complete -  SW Recovery Care/Counseling Consult Complete -  Palliative Care Screening Not Applicable -  Skilled Nursing Facility Not Applicable -  Some recent data might be hidden

## 2021-06-11 NOTE — Procedures (Signed)
Bedside Bronchoscopy Procedure Note Kathleen Cameron [id number] 05-06-1965  Procedure: Bronchoscopy Indications: Diagnostic evaluation of the airways, Obtain specimens for culture and/or other diagnostic studies, and Remove secretions  Procedure Details: ET Tube Size: ET Tube secured at lip (cm): Bite block in place: No In preparation for procedure, Patient hyper-oxygenated with 100 % FiO2 and Saline given via ETT (60 ml) Airway entered and the following bronchi were examined: RUL, RML, RLL, LUL, LLL, and Bronchi.   Bronchoscope removed.  , Patient placed back on 100% FiO2 at conclusion of procedure.    Evaluation BP (!) 71/49    Pulse 90    Temp 98.6 F (37 C) (Oral)    Resp 15    Wt 51.3 kg    SpO2 94%    BMI 18.82 kg/m  Breath Sounds:Clear and Diminished O2 sats: stable throughout Patient's Current Condition: stable Specimens:  Sent purulent fluid Complications: No apparent complications Patient did tolerate procedure well.   Ancil Boozer 06/11/2021, 9:05 AM

## 2021-06-11 NOTE — Plan of Care (Signed)
°  Problem: Pain Managment: Goal: General experience of comfort will improve Outcome: Progressing   Problem: Clinical Measurements: Goal: Respiratory complications will improve Outcome: Not Progressing Goal: Cardiovascular complication will be avoided Outcome: Not Progressing   Problem: Activity: Goal: Risk for activity intolerance will decrease Outcome: Not Progressing   Problem: Nutrition: Goal: Adequate nutrition will be maintained Outcome: Not Progressing

## 2021-06-12 ENCOUNTER — Inpatient Hospital Stay (HOSPITAL_COMMUNITY): Payer: Medicare Other

## 2021-06-12 DIAGNOSIS — I959 Hypotension, unspecified: Secondary | ICD-10-CM | POA: Diagnosis not present

## 2021-06-12 DIAGNOSIS — R0902 Hypoxemia: Secondary | ICD-10-CM | POA: Diagnosis not present

## 2021-06-12 DIAGNOSIS — J9611 Chronic respiratory failure with hypoxia: Secondary | ICD-10-CM | POA: Diagnosis not present

## 2021-06-12 LAB — POCT I-STAT 7, (LYTES, BLD GAS, ICA,H+H)
Acid-Base Excess: 5 mmol/L — ABNORMAL HIGH (ref 0.0–2.0)
Bicarbonate: 32.9 mmol/L — ABNORMAL HIGH (ref 20.0–28.0)
Calcium, Ion: 1.39 mmol/L (ref 1.15–1.40)
HCT: 33 % — ABNORMAL LOW (ref 36.0–46.0)
Hemoglobin: 11.2 g/dL — ABNORMAL LOW (ref 12.0–15.0)
O2 Saturation: 100 %
Patient temperature: 97.7
Potassium: 4.1 mmol/L (ref 3.5–5.1)
Sodium: 136 mmol/L (ref 135–145)
TCO2: 35 mmol/L — ABNORMAL HIGH (ref 22–32)
pCO2 arterial: 60.3 mmHg — ABNORMAL HIGH (ref 32–48)
pH, Arterial: 7.342 — ABNORMAL LOW (ref 7.35–7.45)
pO2, Arterial: 185 mmHg — ABNORMAL HIGH (ref 83–108)

## 2021-06-12 LAB — BASIC METABOLIC PANEL
Anion gap: 8 (ref 5–15)
Anion gap: 8 (ref 5–15)
BUN: 20 mg/dL (ref 6–20)
BUN: 24 mg/dL — ABNORMAL HIGH (ref 6–20)
CO2: 30 mmol/L (ref 22–32)
CO2: 28 mmol/L (ref 22–32)
Calcium: 10 mg/dL (ref 8.9–10.3)
Calcium: 9.7 mg/dL (ref 8.9–10.3)
Chloride: 96 mmol/L — ABNORMAL LOW (ref 98–111)
Chloride: 98 mmol/L (ref 98–111)
Creatinine, Ser: 1.31 mg/dL — ABNORMAL HIGH (ref 0.44–1.00)
Creatinine, Ser: 1.15 mg/dL — ABNORMAL HIGH (ref 0.44–1.00)
GFR, Estimated: 48 mL/min — ABNORMAL LOW (ref >60)
GFR, Estimated: 56 mL/min — ABNORMAL LOW (ref >60)
Glucose, Bld: 134 mg/dL — ABNORMAL HIGH (ref 70–99)
Glucose, Bld: 152 mg/dL — ABNORMAL HIGH (ref 70–99)
Potassium: 4 mmol/L (ref 3.5–5.1)
Potassium: 4.4 mmol/L (ref 3.5–5.1)
Sodium: 134 mmol/L — ABNORMAL LOW (ref 135–145)
Sodium: 134 mmol/L — ABNORMAL LOW (ref 135–145)

## 2021-06-12 LAB — GLUCOSE, CAPILLARY
Glucose-Capillary: 114 mg/dL — ABNORMAL HIGH (ref 70–99)
Glucose-Capillary: 116 mg/dL — ABNORMAL HIGH (ref 70–99)
Glucose-Capillary: 119 mg/dL — ABNORMAL HIGH (ref 70–99)
Glucose-Capillary: 125 mg/dL — ABNORMAL HIGH (ref 70–99)
Glucose-Capillary: 143 mg/dL — ABNORMAL HIGH (ref 70–99)
Glucose-Capillary: 144 mg/dL — ABNORMAL HIGH (ref 70–99)

## 2021-06-12 LAB — CBC
HCT: 32.5 % — ABNORMAL LOW (ref 36.0–46.0)
Hemoglobin: 10.1 g/dL — ABNORMAL LOW (ref 12.0–15.0)
MCH: 27.4 pg (ref 26.0–34.0)
MCHC: 31.1 g/dL (ref 30.0–36.0)
MCV: 88.1 fL (ref 80.0–100.0)
Platelets: 272 10*3/uL (ref 150–400)
RBC: 3.69 MIL/uL — ABNORMAL LOW (ref 3.87–5.11)
RDW: 15.6 % — ABNORMAL HIGH (ref 11.5–15.5)
WBC: 9.6 10*3/uL (ref 4.0–10.5)
nRBC: 0 % (ref 0.0–0.2)

## 2021-06-12 LAB — PHOSPHORUS
Phosphorus: 2.5 mg/dL (ref 2.5–4.6)
Phosphorus: 3.1 mg/dL (ref 2.5–4.6)

## 2021-06-12 LAB — MAGNESIUM
Magnesium: 1.9 mg/dL (ref 1.7–2.4)
Magnesium: 1.9 mg/dL (ref 1.7–2.4)

## 2021-06-12 LAB — TRIGLYCERIDES: Triglycerides: 74 mg/dL (ref <150)

## 2021-06-12 MED ORDER — HYDROXYZINE HCL 25 MG PO TABS: 25.0000 mg | ORAL_TABLET | Freq: Every evening | ORAL | Status: DC | PRN

## 2021-06-12 MED ORDER — VITAL HIGH PROTEIN PO LIQD: 1000.0000 mL | ORAL | Status: DC

## 2021-06-12 MED ORDER — CHLORHEXIDINE GLUCONATE CLOTH 2 % EX PADS
6.0000 | MEDICATED_PAD | Freq: Every day | CUTANEOUS | Status: DC
  Administered 2021-06-13 – 2021-06-23 (×11): 6 via TOPICAL

## 2021-06-12 MED ORDER — SODIUM CHLORIDE 0.9% FLUSH
10.0000 mL | Freq: Two times a day (BID) | INTRAVENOUS | Status: DC
  Administered 2021-06-12 – 2021-06-13 (×3): 10 mL
  Administered 2021-06-14: 20 mL
  Administered 2021-06-14 – 2021-06-17 (×7): 10 mL
  Administered 2021-06-18: 40 mL
  Administered 2021-06-18: 10 mL
  Administered 2021-06-19: 40 mL
  Administered 2021-06-19 – 2021-06-20 (×2): 10 mL
  Administered 2021-06-20: 30 mL
  Administered 2021-06-21: 20 mL
  Administered 2021-06-21 – 2021-06-22 (×2): 10 mL
  Administered 2021-06-22 – 2021-06-23 (×2): 40 mL
  Administered 2021-06-23 – 2021-06-24 (×2): 10 mL

## 2021-06-12 MED ORDER — OSMOLITE 1.2 CAL PO LIQD
1000.0000 mL | ORAL | Status: DC
  Administered 2021-06-12 – 2021-06-13 (×2): 1000 mL
  Filled 2021-06-12 (×8): qty 1000

## 2021-06-12 MED ORDER — POLYETHYLENE GLYCOL 3350 17 G PO PACK: 17.0000 g | PACK | Freq: Every day | ORAL | Status: DC | PRN

## 2021-06-12 MED ORDER — SODIUM CHLORIDE 0.9% FLUSH
10.0000 mL | INTRAVENOUS | Status: DC | PRN
  Administered 2021-06-12: 10 mL

## 2021-06-12 MED ORDER — GUAIFENESIN 100 MG/5ML PO LIQD
5.0000 mL | Freq: Two times a day (BID) | ORAL | Status: DC
  Administered 2021-06-13 – 2021-06-16 (×7): 5 mL
  Filled 2021-06-12 (×7): qty 5

## 2021-06-12 MED ORDER — CLONAZEPAM 0.5 MG PO TABS
0.5000 mg | ORAL_TABLET | Freq: Three times a day (TID) | ORAL | Status: DC
Start: 2021-06-12 — End: 2021-06-16
  Administered 2021-06-12 – 2021-06-16 (×12): 0.5 mg
  Filled 2021-06-12 (×12): qty 1

## 2021-06-12 MED ORDER — METHYLPREDNISOLONE SODIUM SUCC 125 MG IJ SOLR
60.0000 mg | Freq: Every day | INTRAMUSCULAR | Status: DC
Start: 2021-06-13 — End: 2021-06-14
  Administered 2021-06-13 – 2021-06-14 (×2): 60 mg via INTRAVENOUS
  Filled 2021-06-12 (×2): qty 2

## 2021-06-12 MED ORDER — CHLORHEXIDINE GLUCONATE CLOTH 2 % EX PADS: 6.0000 | MEDICATED_PAD | Freq: Every day | CUTANEOUS | Status: DC

## 2021-06-12 MED ORDER — ALBUTEROL SULFATE (2.5 MG/3ML) 0.083% IN NEBU
2.5000 mg | INHALATION_SOLUTION | Freq: Four times a day (QID) | RESPIRATORY_TRACT | Status: DC
  Administered 2021-06-12 – 2021-06-15 (×11): 2.5 mg via RESPIRATORY_TRACT
  Filled 2021-06-12 (×11): qty 3

## 2021-06-12 NOTE — Progress Notes (Signed)
NAME:  Kathleen Cameron, MRN:  [id number], DOB:  [birth year], LOS: 2 ADMISSION DATE:  06/04/2021, CONSULTATION DATE:  06/10/2021 REFERRING MD:  Madilyn Hook - EDP CHIEF COMPLAINT: Chest pain  History of Present Illness:  56 year old woman who presented to [facility] ED 2/19 with chest pain. PMHx significant for HTN, severe COPD (followed by Dr. [name]), chronic respiratory failure with tracheostomy in place, anxiety.  Prior to arrival, patient recently had change in ventilation from TV 480cc down to TV of 400cc. She has presented to the ED 3 times in the past 24 hours.  Discharged from the ED at 1500 after presenting with abdominal pain.  CT Chest/A/P negative for acute abnormality within the chest, abdomen, or pelvis. Presented again at 2200 with chest pain.  ED course notable for stable troponin (18 > 14), slightly elevated D-dimer, CTA Chest negative for PE but with concern for acute airway narrowing and opacification at the left mainstem bronchus, left lower lobar bronchus and lower subsegmental lower lobe airways.   PCCM was consulted for admission.    Pertinent Medical History:  severe COPD, Chronic respiratory failure with tracheostomy in place, hypertension, anxiety  Significant Hospital Events: Including procedures, antibiotic start and stop dates in addition to other pertinent events   2/19-20: presented to ED x3 times. Abd pain: CT chest/abd/pelvis negative for acute process. CT chest: concern for L airway narrowing.  2/21 restless and anxious on vent overnight, received one dose paralytic and fentanyl.  Bronch demonstrating dynamic collapse of bilateral mainstem. 2/22 Improved vent mechanics, remains on Propofol/Fentanyl; Cortrak placement for nutritional support  Imaging: Chest x-ray: Stable trach no pneumo, pulmonary edema, acute airspace disease. Emphysema CTA chest: No PE.  Acute airway narrowing and opacification in the left mainstem bronchus and the left lower bronchus.  Suspect mucous  plugging. Twelve-lead: Sinus rhythm, left anterior fascicular block, no ST changes noted  Micro: 2/20 Covid/Flu>neg 2/21 RVP> 2/20 Respiratory cs>neg  Abx: 2/20 Azith 2/20 Doxy  Interim History / Subjective:  No significant events overnight Calm without agitation on vent this morning Improved vent mechanics PEEP 15, FiO2 40% Labile blood pressure intermittently requiring Levophed to maintain MAP goal Unable to place NG tube yesterday Plan for Cortrak placement today  Objective:  Blood pressure (!) 109/56, pulse 73, temperature 97.7 F (36.5 C), resp. rate (!) 24, weight 55.8 kg, SpO2 98 %.    Vent Mode: PRVC FiO2 (%):  [40 %-100 %] 40 % Set Rate:  [15 bmp-24 bmp] 24 bmp Vt Set:  [440 mL-550 mL] 440 mL PEEP:  [5 cmH20-15 cmH20] 15 cmH20 Plateau Pressure:  [20 cmH20-40 cmH20] 27 cmH20   Intake/Output Summary (Last 24 hours) at 06/12/2021 0729 Last data filed at 06/12/2021 0600 Gross per 24 hour  Intake 3265.74 ml  Output 0 ml  Net 3265.74 ml    Filed Weights   06/11/21 0500 06/12/21 0238  Weight: 51.3 kg 55.8 kg   Physical Examination: General: Acute-on-chronically ill-appearing woman in NAD. Sedated on vent. HEENT: [place]/AT, anicteric sclera, pupils equal, round and 49mm; R reactive, L sluggish, moist mucous membranes. Poor dentition. Neuro: Sedated. Does not open eyes with verbal/tactile stimulation. Withdraws to pain in all 4 extremities. Not following commands. +Cough and +Gag  CV: RRR, no m/g/r. PULM: Breathing even and unlabored on vent (PEEP 15, FiO2 40%). Lung fields coarse bilaterally, R > L. GI: Soft, nontender, nondistended. Normoactive bowel sounds. Extremities: No LE edema noted. Skin: Warm/dry, no rashes. Prior PEG site clean/dry without significant drainage or erythema.  Resolved Hospital Problem List:  Abdominal pain - CT A/P negative for acute process.  History of PEG tube.  Lipase normal.  Assessment & Plan:   Acute-on-chronic hypercarbic  respiratory failure, likely secondary to COPD exacerbation End-stage COPD Chronic ventilator dependence Tracheostomy status Baseline end-stage COPD (followed by Dr. Lamonte Sakai); trach- and vent-dependent PTA. Home vent settings: TV 400cc, RR 16, PEEP 5, O2 2-6L. Husband states that symptoms initially noted on with vent changes made 2/17 [facility]). Of note, home ventilator power cord was lost during transport. Home vent out of battery. EMS contacted to search for cord. L mainstem narrowing and opacification on CT Chest. Bedside bronch demonstrated bilateral dynamic collapse of both mainstem.  Extended RVP negative. - Continue full vent support (maintain current vent settings), home vent settings as above - Wean FiO2 for O2 sat > 90% - VAP bundle - Continue bronchodilators; Pulmicort, Brovana/Yupelri, DuoNebs - Continue steroids - Pulmonary hygiene - PAD protocol for sedation: Propofol and Fentanyl for goal RASS -1 to -2 - May need TOC team support if unable to locate home vent cord - Plan for trach exchange prior to d/c once patient is more stable (followed by [name], NP)  Chest pain Troponin stable, twelve-lead note ST changes, believes that it may be due to to airway narrowing seen on CT scan or recent ventilatory changes requiring time for adjustment. Troponins flat. - Cardiac monitoring - Intermittent EKG as appropriate - Optimize electrolytes; K > 4, Mg > 2  Hypotension History of hypertension - Goal MAP > 65 - Low-dose Levophed titrated to goal MAP in the setting of sedation - Hold home amlodipine in the setting of relative hypotension  PEG tube site wound, POA - WOC consult, appreciate recommendations  Hypothyroidism - Continue Synthroid  Chronic, normocytic anemia No bleeding noted, query nutrition-related vs. ACD. - Trend H&H - Transfuse for Hgb < 7.0 or hemodynamically significant bleeding - Monitor for signs/symptoms of active bleeding  Inadequate oral intake  related to acute illness At risk for malnutrition PEG removed PTA. Previously eating well on vent, now with decreased PO intake/inability to take PO due to critical illness/increased vent requirements. - Unable to place NGT 2/21 - Plan for Cortrak placement today, 2/22 - Appreciate Nutrition/RD assistance  Best Practice (right click and "Reselect all SmartList Selections" daily)   Diet/type: NPO, Cortrak to be placed 2/22 DVT prophylaxis: prophylactic heparin  GI prophylaxis: PPI Lines: N/A Foley:  N/A Code Status:  full code Last date of multidisciplinary goals of care discussion [Husband updated at the bedside 2/21]  Critical care time: 38 minutes   Lestine Mount, PA-C [facility] 06/12/21 7:30 AM  Please see [email or web address] for pager details.  From 7A-7P if no response, please call [phone] After hours, please call ELink [phone]

## 2021-06-12 NOTE — Plan of Care (Signed)
°  Problem: Clinical Measurements: Goal: Will remain free from infection Outcome: Progressing Goal: Diagnostic test results will improve Outcome: Progressing Goal: Respiratory complications will improve Outcome: Progressing Goal: Cardiovascular complication will be avoided Outcome: Progressing   Problem: Pain Managment: Goal: General experience of comfort will improve Outcome: Progressing   Problem: Respiratory: Goal: Ability to maintain a clear airway and adequate ventilation will improve Outcome: Progressing

## 2021-06-12 NOTE — Procedures (Signed)
Cortrak  Person Inserting Tube:  Esaw Dace, RD Tube Type:  Cortrak - 43 inches Tube Size:  10 Tube Location:  Right nare Initial Placement:  Stomach Secured by: Bridle Technique Used to Measure Tube Placement:  Marking at nare/corner of mouth Cortrak Secured At:  69 cm Procedure Comments:  Cortrak Tube Team Note:  Consult received to place a Cortrak feeding tube.   X-ray is required, abdominal x-ray has been ordered by the Cortrak team. Please confirm tube placement before using the Cortrak tube.   If the tube becomes dislodged please keep the tube and contact the Cortrak team at [email or web address] ([email or web address]) for replacement.  If after hours and replacement cannot be delayed, place a NG tube and confirm placement with an abdominal x-ray.    Kerman Passey MS, RDN, LDN, CNSC Registered Dietitian III Clinical Nutrition RD Pager and On-Call Pager Number Located in [place]

## 2021-06-13 ENCOUNTER — Inpatient Hospital Stay (HOSPITAL_COMMUNITY): Payer: Medicare Other

## 2021-06-13 DIAGNOSIS — J9621 Acute and chronic respiratory failure with hypoxia: Secondary | ICD-10-CM | POA: Diagnosis not present

## 2021-06-13 LAB — MAGNESIUM
Magnesium: 2.1 mg/dL (ref 1.7–2.4)
Magnesium: 2.1 mg/dL (ref 1.7–2.4)

## 2021-06-13 LAB — BASIC METABOLIC PANEL
Anion gap: 8 (ref 5–15)
Anion gap: 8 (ref 5–15)
BUN: 32 mg/dL — ABNORMAL HIGH (ref 6–20)
BUN: 34 mg/dL — ABNORMAL HIGH (ref 6–20)
CO2: 28 mmol/L (ref 22–32)
CO2: 28 mmol/L (ref 22–32)
Calcium: 9.3 mg/dL (ref 8.9–10.3)
Calcium: 9 mg/dL (ref 8.9–10.3)
Chloride: 99 mmol/L (ref 98–111)
Chloride: 100 mmol/L (ref 98–111)
Creatinine, Ser: 1.41 mg/dL — ABNORMAL HIGH (ref 0.44–1.00)
Creatinine, Ser: 1.41 mg/dL — ABNORMAL HIGH (ref 0.44–1.00)
GFR, Estimated: 44 mL/min — ABNORMAL LOW (ref >60)
GFR, Estimated: 44 mL/min — ABNORMAL LOW (ref >60)
Glucose, Bld: 143 mg/dL — ABNORMAL HIGH (ref 70–99)
Glucose, Bld: 175 mg/dL — ABNORMAL HIGH (ref 70–99)
Potassium: 4.8 mmol/L (ref 3.5–5.1)
Potassium: 5.6 mmol/L — ABNORMAL HIGH (ref 3.5–5.1)
Sodium: 135 mmol/L (ref 135–145)
Sodium: 136 mmol/L (ref 135–145)

## 2021-06-13 LAB — CBC
HCT: 34.1 % — ABNORMAL LOW (ref 36.0–46.0)
Hemoglobin: 10.7 g/dL — ABNORMAL LOW (ref 12.0–15.0)
MCH: 27.7 pg (ref 26.0–34.0)
MCHC: 31.4 g/dL (ref 30.0–36.0)
MCV: 88.3 fL (ref 80.0–100.0)
Platelets: 327 10*3/uL (ref 150–400)
RBC: 3.86 MIL/uL — ABNORMAL LOW (ref 3.87–5.11)
RDW: 16.6 % — ABNORMAL HIGH (ref 11.5–15.5)
WBC: 17 10*3/uL — ABNORMAL HIGH (ref 4.0–10.5)
nRBC: 0 % (ref 0.0–0.2)

## 2021-06-13 LAB — GLUCOSE, CAPILLARY
Glucose-Capillary: 117 mg/dL — ABNORMAL HIGH (ref 70–99)
Glucose-Capillary: 119 mg/dL — ABNORMAL HIGH (ref 70–99)
Glucose-Capillary: 127 mg/dL — ABNORMAL HIGH (ref 70–99)
Glucose-Capillary: 127 mg/dL — ABNORMAL HIGH (ref 70–99)
Glucose-Capillary: 129 mg/dL — ABNORMAL HIGH (ref 70–99)
Glucose-Capillary: 160 mg/dL — ABNORMAL HIGH (ref 70–99)
Glucose-Capillary: 162 mg/dL — ABNORMAL HIGH (ref 70–99)

## 2021-06-13 LAB — PHOSPHORUS
Phosphorus: 3.7 mg/dL (ref 2.5–4.6)
Phosphorus: 3.7 mg/dL (ref 2.5–4.6)

## 2021-06-13 LAB — LACTIC ACID, PLASMA: Lactic Acid, Venous: 0.8 mmol/L (ref 0.5–1.9)

## 2021-06-13 MED ORDER — ROCURONIUM BROMIDE 10 MG/ML (PF) SYRINGE
PREFILLED_SYRINGE | INTRAVENOUS | Status: AC
  Administered 2021-06-13: 50 mg via INTRAVENOUS
  Filled 2021-06-13: qty 10

## 2021-06-13 MED ORDER — PIPERACILLIN-TAZOBACTAM 3.375 G IVPB: 3.3750 g | Freq: Three times a day (TID) | INTRAVENOUS | Status: DC

## 2021-06-13 MED ORDER — ROCURONIUM BROMIDE 10 MG/ML (PF) SYRINGE
50.0000 mg | PREFILLED_SYRINGE | INTRAVENOUS | Status: AC | PRN
  Administered 2021-06-13 (×2): 50 mg via INTRAVENOUS
  Filled 2021-06-13 (×3): qty 10

## 2021-06-13 MED ORDER — VENLAFAXINE HCL 37.5 MG PO TABS
37.5000 mg | ORAL_TABLET | Freq: Two times a day (BID) | ORAL | Status: DC
  Administered 2021-06-13 – 2021-06-21 (×17): 37.5 mg
  Filled 2021-06-13 (×18): qty 1

## 2021-06-13 MED ORDER — SODIUM CHLORIDE 0.9 % IV SOLN
2.0000 g | Freq: Two times a day (BID) | INTRAVENOUS | Status: DC
  Administered 2021-06-13 – 2021-06-21 (×18): 2 g via INTRAVENOUS
  Filled 2021-06-13 (×18): qty 2

## 2021-06-13 NOTE — Progress Notes (Signed)
Called to room for vent alarm. Pt is waking up, high pressuring alarm. Changed HME, suctioned pt. Sat 90-92%. Still high pressures in 40s. Pt double breathing. RN at bedside on phone with MD. Will monitor.

## 2021-06-13 NOTE — Progress Notes (Signed)
NAME:  Kathleen Cameron, MRN:  [id number], DOB:  [birth year], LOS: 3 ADMISSION DATE:  06/07/2021, CONSULTATION DATE:  06/10/2021 REFERRING MD:  Ralene Bathe - EDP CHIEF COMPLAINT: Chest pain  History of Present Illness:  56 year old woman who presented to [facility] ED 2/19 with chest pain. PMHx significant for HTN, severe COPD (followed by Dr. Lamonte Sakai), chronic respiratory failure with tracheostomy in place, anxiety.  Prior to arrival, patient recently had change in ventilation from TV 480cc down to TV of 400cc. She has presented to the ED 3 times in the past 24 hours.  Discharged from the ED at 1500 after presenting with abdominal pain.  CT Chest/A/P negative for acute abnormality within the chest, abdomen, or pelvis. Presented again at 2200 with chest pain.  ED course notable for stable troponin (18 > 14), slightly elevated D-dimer, CTA Chest negative for PE but with concern for acute airway narrowing and opacification at the left mainstem bronchus, left lower lobar bronchus and lower subsegmental lower lobe airways.   PCCM was consulted for admission.    Pertinent Medical History:  severe COPD, Chronic respiratory failure with tracheostomy in place, hypertension, anxiety  Significant Hospital Events: Including procedures, antibiotic start and stop dates in addition to other pertinent events   2/19-20: presented to ED x3 times. Abd pain: CT chest/abd/pelvis negative for acute process. CT chest: concern for L airway narrowing.  2/21 restless and anxious on vent overnight, received one dose paralytic and fentanyl.  Bronch demonstrating dynamic collapse of bilateral mainstem. 2/22 Improved vent mechanics, remains on Propofol/Fentanyl; Cortrak placement for nutritional support. 2/23 Vent dyssynchrony/breath stacking with increased pressures on vent early AM. Resolved with additional dose roc, sedation adjustment.  Imaging: Chest x-ray: Stable trach no pneumo, pulmonary edema, acute airspace disease.  Emphysema CTA chest: No PE.  Acute airway narrowing and opacification in the left mainstem bronchus and the left lower bronchus.  Suspect mucous plugging. Twelve-lead: Sinus rhythm, left anterior fascicular block, no ST changes noted  Micro: 2/20 Covid/Flu > neg 2/21 RVP > neg 2/20 Respiratory Cx > neg  Abx: 2/20 Azith 2/20 Doxy  Interim History / Subjective:  Overnight/early AM, vent dyssynchrony Breath stacking with elevated pressures Required additional roc dose with improvement Calm and no agitation/dyssynchrony on rounds Levophed down to 66mcg (max 12 overnight with increased sedation) S/p Cortrak 2/22 WBC uptrending  Objective:  Blood pressure (!) 107/59, pulse 67, temperature (!) 97.2 F (36.2 C), resp. rate (!) 24, weight 59 kg, SpO2 99 %.    Vent Mode: PRVC FiO2 (%):  [40 %-50 %] 50 % Set Rate:  [24 bmp] 24 bmp Vt Set:  [440 mL] 440 mL PEEP:  [15 cmH20] 15 cmH20 Plateau Pressure:  [27 cmH20-33 cmH20] 29 cmH20   Intake/Output Summary (Last 24 hours) at 06/13/2021 0732 Last data filed at 06/13/2021 0600 Gross per 24 hour  Intake 3310.37 ml  Output 1075 ml  Net 2235.37 ml    Filed Weights   06/11/21 0500 06/12/21 0238 06/13/21 0427  Weight: 51.3 kg 55.8 kg 59 kg   Physical Examination: General: Chronically ill-appearing middle-aged woman in NAD. Sedated/paralyzed on vent. HEENT: [place]/AT, anicteric sclera, pupils equal/round 57mm, sluggishly reactive to light bilaterally, dry mucous membranes. Neuro: Sedated. Does not respond to verbal, tactile or noxious stimuli. Not following commands. No spontaneous movement of extremities noted on exam. +Cough and +Gag  CV: RRR, no m/g/r. PULM: Breathing even and unlabored on vent (PEEP 15, FiO2 50%). Lung fields coarse bilaterally, more notable in upper fields,  R > L. GI: Soft, nontender, mildly distended. Hypoactive bowel sounds. Extremities: No significant LE edema noted. Skin: Warm/dry, no rashes.  Resolved Hospital  Problem List:  Abdominal pain - CT A/P negative for acute process.  History of PEG tube.  Lipase normal.  Assessment & Plan:   Acute-on-chronic hypercarbic respiratory failure, likely secondary to COPD exacerbation End-stage COPD Chronic ventilator dependence Tracheostomy status Baseline end-stage COPD (followed by Dr. Lamonte Sakai); trach- and vent-dependent PTA. Home vent settings: TV 400cc, RR 16, PEEP 5, O2 2-6L. Husband states that symptoms initially noted on with vent changes made 2/17 [facility]). Of note, home ventilator power cord was lost during transport. Home vent out of battery. EMS contacted to search for cord. L mainstem narrowing and opacification on CT Chest. Bedside bronch demonstrated bilateral dynamic collapse of both mainstem.  Extended RVP negative. - Continue full vent support (maintain current vent settings with exception of FiO2) - Wean FiO2 for O2 sat > 90% - VAP bundle - Continue bronchodilators: Pulmicort, Brovana/Yupelri, DuoNebs - Continue steroids, taper - Pulmonary hygiene - Intermittent roc for vent dyssynchrony/significantly elevated vent pressures - PAD protocol for sedation: Propofol and Fentanyl for goal RASS -1 to -2 - May need TOC team support if unable to locate home vent cord - Plan for trach exchange prior to d/c once patient is more stable (followed by [name], NP)  Chest pain Troponin stable, twelve-lead note ST changes, believes that it may be due to to airway narrowing seen on CT scan or recent ventilatory changes requiring time for adjustment. Troponins flat. - Cardiac monitoring - Intermittent EKG as clinically appropriate - Optimize electrolytes, K > 4, Mg > 2  Hypotension History of hypertension - Goal MAP > 65 - Low-dose Levophed titrated to goal MAP (hypotension appears to be sedation-associated) - Hold home amlodipine  PEG tube site wound, POA - WOC consult, appreciate recs - Local wound care  Hypothyroidism - Continue  Synthroid  Chronic, normocytic anemia No bleeding noted, query nutrition-related vs. ACD. - Trend H&H, stable at present - Transfuse for Hgb < 7.0 or hemodynamically significant bleeding - Monitor for signs/symptoms of active bleeding  Inadequate oral intake related to acute illness At risk for malnutrition PEG removed PTA. Previously eating well on vent, now with decreased PO intake/inability to take PO due to critical illness/increased vent requirements. - S/p Cortrak placement 2/22 - Appreciate Nutrition/RD assistance - Continue TF, VT meds  Best Practice (right click and "Reselect all SmartList Selections" daily)   Diet/type: NPO, TF via Cortrak DVT prophylaxis: prophylactic heparin  GI prophylaxis: PPI Lines: N/A Foley:  N/A Code Status:  full code Last date of multidisciplinary goals of care discussion [Husband updated at the bedside 2/21]  Critical care time: 35 minutes   Lestine Mount, PA-C [facility] 06/13/21 7:32 AM  Please see [email or web address] for pager details.  From 7A-7P if no response, please call [phone] After hours, please call ELink [phone]

## 2021-06-13 NOTE — Progress Notes (Signed)
Pharmacy Antibiotic Note  Kathleen Cameron is a 56 y.o. female admitted on 06/11/2021 with pneumonia.  Pharmacy has been consulted for cefepime dosing.  Previously on ceftriaxone, azthromycin and doxycycline.  Respiratory culture today growing abundant pseudomonas and proteus.  Pharmacy asked to switch antibiotics to cefepime.  Plan: Cefepime 2g IV q 12 hrs. F/u sensitivities.  Weight: 59 kg (130 lb 1.1 oz)  Temp (24hrs), Avg:97.6 F (36.4 C), Min:96.6 F (35.9 C), Max:99.1 F (37.3 C)  Recent Labs  Lab 06/08/21 1635 06/10/21 0042 06/10/21 1351 06/11/21 0014 06/11/21 1404 06/12/21 0419 06/12/21 1625 06/13/21 0416 06/13/21 0750 06/13/21 1032  WBC 11.9* 6.6  --  13.1*  --  9.6  --  17.0*  --   --   CREATININE 0.97 0.92   < > 0.98 1.48* 1.31* 1.15*  --  1.41*  --   LATICACIDVEN  --   --   --   --   --   --   --   --   --  0.8   < > = values in this interval not displayed.    Estimated Creatinine Clearance: 39.6 mL/min (A) (by C-G formula based on SCr of 1.41 mg/dL (H)).    Allergies  Allergen Reactions   Stiolto Respimat [Tiotropium Bromide-Olodaterol] Other (See Comments)    Severe headaches and rapid heartrate    Antimicrobials this admission: Azithro 2/20 > 2/21 Doxy 2/20 > 2/23  Ceftriaxone 2/21 > 2/23 Cefepime 2/23 >   Dose adjustments this admission:  Microbiology results: 2/21 BCx > ngtd 2/21 Resp virus panel - neg 2/21 - BAL - abundant proteus mirabilis, abundant pseudomonas  Thank you for allowing pharmacy to be a part of this patients care.  [place] Crane, Roylene Reason, [facility] Clinical Pharmacist  06/13/2021 11:31 AM   [facility] pharmacy phone numbers are listed on [email or web address]

## 2021-06-13 NOTE — Plan of Care (Signed)

## 2021-06-13 NOTE — Progress Notes (Signed)
eLink Physician-Brief Progress Note Patient Name: Kathleen Cameron DOB: 11-27-1965 MRN: [id number]   Date of Service  06/13/2021  HPI/Events of Note  Patient now with ventilator asynchrony (double stacking breaths and not getting set volumes) in spite of being well sedated with Fentanyl and Propofol. RT suggesting albuterol PRN. However, recent bronchoscopy revealed EDAC which will not be improved by albuterol.   eICU Interventions  Plan: Give Rocuronium as already ordered.      Intervention Category Major Interventions: Respiratory failure - evaluation and management  Alroy Portela Eugene 06/13/2021, 12:30 AM

## 2021-06-14 ENCOUNTER — Inpatient Hospital Stay (HOSPITAL_COMMUNITY): Payer: Medicare Other

## 2021-06-14 DIAGNOSIS — J151 Pneumonia due to Pseudomonas: Secondary | ICD-10-CM

## 2021-06-14 DIAGNOSIS — J9611 Chronic respiratory failure with hypoxia: Secondary | ICD-10-CM | POA: Diagnosis not present

## 2021-06-14 DIAGNOSIS — A419 Sepsis, unspecified organism: Secondary | ICD-10-CM

## 2021-06-14 DIAGNOSIS — R6521 Severe sepsis with septic shock: Secondary | ICD-10-CM

## 2021-06-14 DIAGNOSIS — J398 Other specified diseases of upper respiratory tract: Secondary | ICD-10-CM | POA: Diagnosis not present

## 2021-06-14 LAB — BASIC METABOLIC PANEL
Anion gap: 6 (ref 5–15)
Anion gap: 7 (ref 5–15)
BUN: 36 mg/dL — ABNORMAL HIGH (ref 6–20)
BUN: 39 mg/dL — ABNORMAL HIGH (ref 6–20)
CO2: 26 mmol/L (ref 22–32)
CO2: 30 mmol/L (ref 22–32)
Calcium: 9 mg/dL (ref 8.9–10.3)
Calcium: 9.1 mg/dL (ref 8.9–10.3)
Chloride: 102 mmol/L (ref 98–111)
Chloride: 100 mmol/L (ref 98–111)
Creatinine, Ser: 1.25 mg/dL — ABNORMAL HIGH (ref 0.44–1.00)
Creatinine, Ser: 1.28 mg/dL — ABNORMAL HIGH (ref 0.44–1.00)
GFR, Estimated: 50 mL/min — ABNORMAL LOW (ref >60)
GFR, Estimated: 49 mL/min — ABNORMAL LOW (ref >60)
Glucose, Bld: 123 mg/dL — ABNORMAL HIGH (ref 70–99)
Glucose, Bld: 116 mg/dL — ABNORMAL HIGH (ref 70–99)
Potassium: 5.5 mmol/L — ABNORMAL HIGH (ref 3.5–5.1)
Potassium: 5.4 mmol/L — ABNORMAL HIGH (ref 3.5–5.1)
Sodium: 134 mmol/L — ABNORMAL LOW (ref 135–145)
Sodium: 137 mmol/L (ref 135–145)

## 2021-06-14 LAB — GLUCOSE, CAPILLARY
Glucose-Capillary: 110 mg/dL — ABNORMAL HIGH (ref 70–99)
Glucose-Capillary: 115 mg/dL — ABNORMAL HIGH (ref 70–99)
Glucose-Capillary: 68 mg/dL — ABNORMAL LOW (ref 70–99)
Glucose-Capillary: 70 mg/dL (ref 70–99)
Glucose-Capillary: 74 mg/dL (ref 70–99)
Glucose-Capillary: 85 mg/dL (ref 70–99)
Glucose-Capillary: 93 mg/dL (ref 70–99)

## 2021-06-14 LAB — CBC
HCT: 32.1 % — ABNORMAL LOW (ref 36.0–46.0)
Hemoglobin: 9.6 g/dL — ABNORMAL LOW (ref 12.0–15.0)
MCH: 27.1 pg (ref 26.0–34.0)
MCHC: 29.9 g/dL — ABNORMAL LOW (ref 30.0–36.0)
MCV: 90.7 fL (ref 80.0–100.0)
Platelets: 223 10*3/uL (ref 150–400)
RBC: 3.54 MIL/uL — ABNORMAL LOW (ref 3.87–5.11)
RDW: 16.9 % — ABNORMAL HIGH (ref 11.5–15.5)
WBC: 9.3 10*3/uL (ref 4.0–10.5)
nRBC: 0 % (ref 0.0–0.2)

## 2021-06-14 LAB — LACTIC ACID, PLASMA
Lactic Acid, Venous: 0.6 mmol/L (ref 0.5–1.9)
Lactic Acid, Venous: 0.8 mmol/L (ref 0.5–1.9)

## 2021-06-14 LAB — CULTURE, RESPIRATORY W GRAM STAIN

## 2021-06-14 MED ORDER — ROCURONIUM BROMIDE 10 MG/ML (PF) SYRINGE
50.0000 mg | PREFILLED_SYRINGE | Freq: Once | INTRAVENOUS | Status: AC
  Administered 2021-06-14: 50 mg via INTRAVENOUS
  Filled 2021-06-14: qty 10

## 2021-06-14 MED ORDER — PREDNISONE 20 MG PO TABS: 30.0000 mg | ORAL_TABLET | Freq: Every day | ORAL | Status: DC

## 2021-06-14 MED ORDER — PREDNISONE 10 MG PO TABS: 10.0000 mg | ORAL_TABLET | Freq: Every day | ORAL | Status: DC

## 2021-06-14 MED ORDER — FUROSEMIDE 10 MG/ML IJ SOLN
40.0000 mg | Freq: Once | INTRAMUSCULAR | Status: AC
Start: 2021-06-14 — End: 2021-06-14
  Administered 2021-06-14: 40 mg via INTRAVENOUS
  Filled 2021-06-14: qty 4

## 2021-06-14 MED ORDER — DEXTROSE IN LACTATED RINGERS 5 % IV SOLN
INTRAVENOUS | Status: DC
  Filled 2021-06-14: qty 1000

## 2021-06-14 MED ORDER — PREDNISONE 20 MG PO TABS: 40.0000 mg | ORAL_TABLET | Freq: Every day | ORAL | Status: DC

## 2021-06-14 MED ORDER — ROCURONIUM BROMIDE 10 MG/ML (PF) SYRINGE
50.0000 mg | PREFILLED_SYRINGE | INTRAVENOUS | Status: AC | PRN
  Administered 2021-06-14: 50 mg via INTRAVENOUS
  Filled 2021-06-14: qty 10

## 2021-06-14 MED ORDER — PREDNISONE 20 MG PO TABS: 20.0000 mg | ORAL_TABLET | Freq: Every day | ORAL | Status: DC

## 2021-06-14 MED ORDER — FUROSEMIDE 10 MG/ML IJ SOLN
40.0000 mg | Freq: Once | INTRAMUSCULAR | Status: AC
  Administered 2021-06-14: 40 mg via INTRAVENOUS
  Filled 2021-06-14: qty 4

## 2021-06-14 MED ORDER — METHYLPREDNISOLONE SODIUM SUCC 125 MG IJ SOLR
50.0000 mg | Freq: Every day | INTRAMUSCULAR | Status: DC
  Administered 2021-06-15 – 2021-06-16 (×2): 50 mg via INTRAVENOUS
  Filled 2021-06-14 (×2): qty 2

## 2021-06-14 NOTE — Progress Notes (Addendum)
NAME:  Kathleen Cameron, MRN:  [id number], DOB:  Oct 12, 1965, LOS: 4 ADMISSION DATE:  06/13/2021, CONSULTATION DATE:  06/10/2021 REFERRING MD:  Ralene Bathe - EDP CHIEF COMPLAINT: Chest pain  History of Present Illness:  56 year old woman who presented to [facility] ED 2/19 with chest pain. PMHx significant for HTN, severe COPD (followed by Dr. Lamonte Sakai), chronic respiratory failure with tracheostomy in place, anxiety.  Prior to arrival, patient recently had change in ventilation from TV 480cc down to TV of 400cc. She has presented to the ED 3 times in the past 24 hours.  Discharged from the ED at 1500 after presenting with abdominal pain.  CT Chest/A/P negative for acute abnormality within the chest, abdomen, or pelvis. Presented again at 2200 with chest pain.  ED course notable for stable troponin (18 > 14), slightly elevated D-dimer, CTA Chest negative for PE but with concern for acute airway narrowing and opacification at the left mainstem bronchus, left lower lobar bronchus and lower subsegmental lower lobe airways.   PCCM was consulted for admission.    Pertinent Medical History:  Severe COPD, Chronic respiratory failure with tracheostomy in place, hypertension, anxiety  Significant Hospital Events: Including procedures, antibiotic start and stop dates in addition to other pertinent events   2/19-20: presented to ED x3 times. Abd pain: CT chest/abd/pelvis negative for acute process. CT chest: concern for L airway narrowing.  2/21 restless and anxious on vent overnight, received one dose paralytic and fentanyl.  Bronch demonstrating dynamic collapse of bilateral mainstem. 2/22 Improved vent mechanics, remains on Propofol/Fentanyl; Cortrak placement for nutritional support. 2/23 Vent dyssynchrony/breath stacking with increased pressures on vent early AM. Resolved with additional dose roc, sedation adjustment. 2/24 Ongoing vent dyssynchrony throughout the last 24 hours requiring 6 PRN doses of roc.    Interim History / Subjective:  Ongoing vent dyssynchrony overnight Intermittently dyssynchronous throughout the day 2/23 Required 6 total PRN rocuronium doses WBC 9.3 (17), improved with broadening to cefepime K slightly elevated at 5.5, medically managing  Objective:  Blood pressure (!) 100/57, pulse 71, temperature 97.7 F (36.5 C), resp. rate (!) 24, weight 60.8 kg, SpO2 99 %. CVP:  [14 mmHg-16 mmHg] 14 mmHg  Vent Mode: PRVC FiO2 (%):  [40 %-50 %] 40 % Set Rate:  [24 bmp] 24 bmp Vt Set:  [440 mL] 440 mL PEEP:  [15 cmH20] 15 cmH20 Plateau Pressure:  [26 cmH20-32 cmH20] 32 cmH20   Intake/Output Summary (Last 24 hours) at 06/14/2021 0759 Last data filed at 06/14/2021 0700 Gross per 24 hour  Intake 4946 ml  Output 2025 ml  Net 2921 ml    Filed Weights   06/12/21 0238 06/13/21 0427 06/14/21 0216  Weight: 55.8 kg 59 kg 60.8 kg   Physical Examination: General: Chronically ill-appearing middle-aged woman in NAD. Sedated, on vent. HEENT: [place]/AT, anicteric sclera, pupils equal round 16mm, sluggishly reactive to light bilaterally, mild exophthalmos. Moist mucous membranes. Neuro: Sedated. Does not respond to verbal, tactile or noxious stimuli. Not following commands.  No spontaneous movement of extremities noted during exam. +Corneal, +Cough, and +Gag  CV: RRR, no m/g/r. PULM: Breathing even and unlabored on vent )PEEP 15, FiO2 40%). Lung fields coarse bilaterally, though improved from previous exam. GI: Soft, nontender, nondistended. Normoactive bowel sounds. Extremities: Trace symmetric BLE edema noted. Skin: Warm/dry, no rashes.  Resolved Hospital Problem List:  Abdominal pain - CT A/P negative for acute process.  History of PEG tube.  Lipase normal.  Assessment & Plan:   Acute-on-chronic hypercarbic respiratory failure,  likely secondary to COPD exacerbation End-stage COPD Chronic ventilator dependence Tracheostomy status Baseline end-stage COPD (followed by Dr. Lamonte Sakai);  trach- and vent-dependent PTA. Home vent settings: TV 400cc, RR 16, PEEP 5, O2 2-6L. Husband states that symptoms initially noted on with vent changes made 2/17 [facility]). Of note, home ventilator power cord was lost during transport. Home vent out of battery. EMS contacted to search for cord. L mainstem narrowing and opacification on CT Chest. Bedside bronch demonstrated bilateral dynamic collapse of both mainstem.  Extended RVP negative. - Continue full vent support (maintain current vent settings with exception of FiO2) - Wean FiO2 for O2 sat > 90% - Daily WUA/SBT once clinically appropriate, need for paralytic/ongoing mental status concerns precludes SBT at this time - Rocuronium PRN for vent dyssynchrony/elevated vent pressures, goal to discontinue this in the next 24 hours and wean sedation to work toward SBT - Continue bronchodilators: Pulmicort, Brovana, Yupelri, DuoNebs - VAP bundle - Pulmonary hygiene - PAD protocol for sedation: Propofol and Fentanyl for goal RASS -1 to -2 - Continue cefepime, given Proteus and Pseudomonas on Resp Cx 2/21 - May need TOC team support if unable to locate home vent cord - Plan for tracheostomy exchange prior to discharge once patient more stable (followed by [name], NP)  Chest pain Troponin stable, twelve-lead note ST changes, believes that it may be due to to airway narrowing seen on CT scan or recent ventilatory changes requiring time for adjustment. Troponins flat. - Cardiac monitoring - Intermittent EKG as clinically appropriate - Optimize electrolytes (K > 4, M g > 2)  Hypotension History of hypertension - Goal MAP > 65 - Low-dose Levophed titrated to goal MAP - Hypotension appears to be sedation-related - Hold home amlodipine  PEG tube site wound, POA - WOC consult, appreciate recs - Local wound care  Hypothyroidism - Continue Synthroid  Chronic, normocytic anemia No bleeding noted, query nutrition-related vs. ACD. - Trend  H&H, stable at present - Transfuse for Hgb < 7.0 or hemodynamically significant bleeding - Monitor for signs/symptoms of active bleeding  Inadequate oral intake related to acute illness At risk for malnutrition PEG removed PTA. Previously eating well on vent, now with decreased PO intake/inability to take PO due to critical illness/increased vent requirements. S/p Cortrak placement 2/22. - Appreciate Nutrition/RD assistance - Continue TF, VT meds  Best Practice (right click and "Reselect all SmartList Selections" daily)   Diet/type: NPO, TF via Cortrak DVT prophylaxis: prophylactic heparin  GI prophylaxis: PPI Lines: N/A Foley:  N/A Code Status:  full code Last date of multidisciplinary goals of care discussion [Husband updated at the bedside 2/21]  Critical care time: 35 minutes   Lestine Mount, PA-C [facility] 06/14/21 7:59 AM  Please see [email or web address] for pager details.  From 7A-7P if no response, please call [phone] After hours, please call ELink [phone]

## 2021-06-14 NOTE — Progress Notes (Addendum)
eLink Physician-Brief Progress Note Patient Name: Kathleen Cameron DOB: 07-11-1965 MRN: [id number]   Date of Service  06/14/2021  HPI/Events of Note  Persisting hyperkalemia at 5.5, not rising. Creatinine improving to 1.25 from > 1.4 Co2 at 26.  Order meds reviewed. On Vent.    eICU Interventions  Continue to watch, follow K in AM.      Intervention Category Intermediate Interventions: Electrolyte abnormality - evaluation and management  [name] 06/14/2021, 1:40 AM  4:07 Suction canister appears to be mixed saliva & TF. Asked bedside to stop TF. No BM since admit. Already getting Miralax and colace. Hypoactive bowel sounds. Abdomen is distended, not new.   - ok to hold and get a KUB.  - aspiration precautions. Keep K/Mag > 4/2 respectively.   5:36 AM May pt get CXR? When laid flat for KUB, TF came up through patient's nose. No new O2 requirements or changed lung sounds.  CxR ordered.

## 2021-06-14 NOTE — Progress Notes (Signed)
eLink Physician-Brief Progress Note Patient Name: Kathleen Cameron DOB: 27-May-1965 MRN: [id number]   Date of Service  06/14/2021  HPI/Events of Note  Notified of hypoglycemia with glucose at 74. Tube feeds stopped due to emesis.  eICU Interventions  Change IVF to D5LR @50cc /hr.     Intervention Category Minor Interventions: Other:  [name] 06/14/2021, 11:58 PM

## 2021-06-15 ENCOUNTER — Inpatient Hospital Stay (HOSPITAL_COMMUNITY): Payer: Medicare Other

## 2021-06-15 DIAGNOSIS — J9611 Chronic respiratory failure with hypoxia: Secondary | ICD-10-CM | POA: Diagnosis not present

## 2021-06-15 LAB — CBC
HCT: 31.4 % — ABNORMAL LOW (ref 36.0–46.0)
Hemoglobin: 9.9 g/dL — ABNORMAL LOW (ref 12.0–15.0)
MCH: 27.8 pg (ref 26.0–34.0)
MCHC: 31.5 g/dL (ref 30.0–36.0)
MCV: 88.2 fL (ref 80.0–100.0)
Platelets: 196 10*3/uL (ref 150–400)
RBC: 3.56 MIL/uL — ABNORMAL LOW (ref 3.87–5.11)
RDW: 17.1 % — ABNORMAL HIGH (ref 11.5–15.5)
WBC: 6.4 10*3/uL (ref 4.0–10.5)
nRBC: 0.5 % — ABNORMAL HIGH (ref 0.0–0.2)

## 2021-06-15 LAB — POCT I-STAT 7, (LYTES, BLD GAS, ICA,H+H)
Acid-Base Excess: 1 mmol/L (ref 0.0–2.0)
Acid-Base Excess: 4 mmol/L — ABNORMAL HIGH (ref 0.0–2.0)
Acid-Base Excess: 6 mmol/L — ABNORMAL HIGH (ref 0.0–2.0)
Bicarbonate: 33.2 mmol/L — ABNORMAL HIGH (ref 20.0–28.0)
Bicarbonate: 33.3 mmol/L — ABNORMAL HIGH (ref 20.0–28.0)
Bicarbonate: 34.6 mmol/L — ABNORMAL HIGH (ref 20.0–28.0)
Calcium, Ion: 1.34 mmol/L (ref 1.15–1.40)
Calcium, Ion: 1.28 mmol/L (ref 1.15–1.40)
Calcium, Ion: 1.28 mmol/L (ref 1.15–1.40)
HCT: 36 % (ref 36.0–46.0)
HCT: 31 % — ABNORMAL LOW (ref 36.0–46.0)
HCT: 32 % — ABNORMAL LOW (ref 36.0–46.0)
Hemoglobin: 12.2 g/dL (ref 12.0–15.0)
Hemoglobin: 10.5 g/dL — ABNORMAL LOW (ref 12.0–15.0)
Hemoglobin: 10.9 g/dL — ABNORMAL LOW (ref 12.0–15.0)
O2 Saturation: 91 %
O2 Saturation: 96 %
O2 Saturation: 99 %
Patient temperature: 37.2
Patient temperature: 37.3
Patient temperature: 37.4
Potassium: 4.8 mmol/L (ref 3.5–5.1)
Potassium: 5.1 mmol/L (ref 3.5–5.1)
Potassium: 5 mmol/L (ref 3.5–5.1)
Sodium: 136 mmol/L (ref 135–145)
Sodium: 138 mmol/L (ref 135–145)
Sodium: 137 mmol/L (ref 135–145)
TCO2: 36 mmol/L — ABNORMAL HIGH (ref 22–32)
TCO2: 36 mmol/L — ABNORMAL HIGH (ref 22–32)
TCO2: 37 mmol/L — ABNORMAL HIGH (ref 22–32)
pCO2 arterial: 99.8 mmHg (ref 32–48)
pCO2 arterial: 75.7 mmHg (ref 32–48)
pCO2 arterial: 70.5 mmHg (ref 32–48)
pH, Arterial: 7.132 — CL (ref 7.35–7.45)
pH, Arterial: 7.253 — ABNORMAL LOW (ref 7.35–7.45)
pH, Arterial: 7.3 — ABNORMAL LOW (ref 7.35–7.45)
pO2, Arterial: 84 mmHg (ref 83–108)
pO2, Arterial: 105 mmHg (ref 83–108)
pO2, Arterial: 187 mmHg — ABNORMAL HIGH (ref 83–108)

## 2021-06-15 LAB — BASIC METABOLIC PANEL
Anion gap: 9 (ref 5–15)
Anion gap: 7 (ref 5–15)
BUN: 42 mg/dL — ABNORMAL HIGH (ref 6–20)
BUN: 45 mg/dL — ABNORMAL HIGH (ref 6–20)
CO2: 28 mmol/L (ref 22–32)
CO2: 32 mmol/L (ref 22–32)
Calcium: 8.9 mg/dL (ref 8.9–10.3)
Calcium: 9.2 mg/dL (ref 8.9–10.3)
Chloride: 99 mmol/L (ref 98–111)
Chloride: 99 mmol/L (ref 98–111)
Creatinine, Ser: 1.4 mg/dL — ABNORMAL HIGH (ref 0.44–1.00)
Creatinine, Ser: 1.37 mg/dL — ABNORMAL HIGH (ref 0.44–1.00)
GFR, Estimated: 44 mL/min — ABNORMAL LOW (ref >60)
GFR, Estimated: 45 mL/min — ABNORMAL LOW (ref >60)
Glucose, Bld: 92 mg/dL (ref 70–99)
Glucose, Bld: 126 mg/dL — ABNORMAL HIGH (ref 70–99)
Potassium: 5.3 mmol/L — ABNORMAL HIGH (ref 3.5–5.1)
Potassium: 5.3 mmol/L — ABNORMAL HIGH (ref 3.5–5.1)
Sodium: 136 mmol/L (ref 135–145)
Sodium: 138 mmol/L (ref 135–145)

## 2021-06-15 LAB — GLUCOSE, CAPILLARY
Glucose-Capillary: 122 mg/dL — ABNORMAL HIGH (ref 70–99)
Glucose-Capillary: 123 mg/dL — ABNORMAL HIGH (ref 70–99)
Glucose-Capillary: 129 mg/dL — ABNORMAL HIGH (ref 70–99)
Glucose-Capillary: 89 mg/dL (ref 70–99)
Glucose-Capillary: 92 mg/dL (ref 70–99)

## 2021-06-15 LAB — TRIGLYCERIDES: Triglycerides: 100 mg/dL (ref <150)

## 2021-06-15 MED ORDER — ALBUTEROL SULFATE (2.5 MG/3ML) 0.083% IN NEBU
2.5000 mg | INHALATION_SOLUTION | Freq: Four times a day (QID) | RESPIRATORY_TRACT | Status: DC | PRN
  Administered 2021-06-15 – 2021-06-22 (×3): 2.5 mg via RESPIRATORY_TRACT
  Filled 2021-06-15 (×3): qty 3

## 2021-06-15 MED ORDER — MIDAZOLAM HCL 2 MG/2ML IJ SOLN
2.0000 mg | INTRAMUSCULAR | Status: DC | PRN
  Administered 2021-06-16: 2 mg via INTRAVENOUS

## 2021-06-15 MED ORDER — PROPOFOL 1000 MG/100ML IV EMUL
5.0000 ug/kg/min | INTRAVENOUS | Status: DC
  Administered 2021-06-15: 10 ug/kg/min via INTRAVENOUS
  Administered 2021-06-15: 40 ug/kg/min via INTRAVENOUS
  Administered 2021-06-16: 30 ug/kg/min via INTRAVENOUS
  Filled 2021-06-15 (×2): qty 100

## 2021-06-15 MED ORDER — REVEFENACIN 175 MCG/3ML IN SOLN
175.0000 ug | Freq: Every day | RESPIRATORY_TRACT | Status: DC
Start: 2021-06-15 — End: 2021-06-24
  Administered 2021-06-16 – 2021-06-24 (×8): 175 ug via RESPIRATORY_TRACT
  Filled 2021-06-15 (×10): qty 3

## 2021-06-15 MED ORDER — STERILE WATER FOR INJECTION IJ SOLN
INTRAMUSCULAR | Status: AC
  Administered 2021-06-15: 10 mL
  Filled 2021-06-15: qty 10

## 2021-06-15 MED ORDER — FUROSEMIDE 10 MG/ML IJ SOLN
40.0000 mg | Freq: Once | INTRAMUSCULAR | Status: AC
Start: 2021-06-15 — End: 2021-06-15
  Administered 2021-06-15: 40 mg via INTRAVENOUS
  Filled 2021-06-15: qty 4

## 2021-06-15 MED ORDER — VECURONIUM BROMIDE 10 MG IV SOLR
8.0000 mg | Freq: Once | INTRAVENOUS | Status: AC
  Administered 2021-06-15: 8 mg via INTRAVENOUS
  Filled 2021-06-15: qty 10

## 2021-06-15 MED ORDER — VECURONIUM BROMIDE 10 MG IV SOLR
8.0000 mg | INTRAVENOUS | Status: DC | PRN
  Administered 2021-06-15: 8 mg via INTRAVENOUS
  Filled 2021-06-15 (×2): qty 10

## 2021-06-15 MED ORDER — MIDAZOLAM HCL 2 MG/2ML IJ SOLN
4.0000 mg | Freq: Once | INTRAMUSCULAR | Status: AC
  Administered 2021-06-15: 4 mg via INTRAVENOUS
  Filled 2021-06-15: qty 4

## 2021-06-15 MED ORDER — MIDAZOLAM HCL 2 MG/2ML IJ SOLN
2.0000 mg | Freq: Once | INTRAMUSCULAR | Status: AC
  Administered 2021-06-15: 2 mg via INTRAVENOUS
  Filled 2021-06-15: qty 2

## 2021-06-15 MED ORDER — STERILE WATER FOR INJECTION IJ SOLN
INTRAMUSCULAR | Status: AC
Start: 2021-06-15 — End: 2021-06-16
  Filled 2021-06-15: qty 10

## 2021-06-15 MED ORDER — MIDAZOLAM HCL 2 MG/2ML IJ SOLN
2.0000 mg | INTRAMUSCULAR | Status: AC | PRN
  Administered 2021-06-18 (×3): 2 mg via INTRAVENOUS
  Filled 2021-06-15 (×2): qty 2

## 2021-06-15 NOTE — Progress Notes (Signed)
Patient began to drop blood pressure to MAP of 40's after respiratory suctioning/stimulation prompting reinitiation of norepinephrine. Upon assessment of the situation, patient was double stacking on ventilator which has been a known problem. 50 mcg bolus of fentanyl did not remedy ventilator double stacking, 8mg  Vecuronium did not remedy ventilator double stacking. Elink and RT notified. RT changes made and double stacking resolved.   Precedex stopped, and propofol initiated per MD orders.   ABG drawn during event. Patient vital signs stable currently, will continue to monitor closely.

## 2021-06-15 NOTE — Plan of Care (Signed)
Fentanyl bag hung and tubing changed per protocol, 15 mL of waste emptied and disposed into controlled waste receptacle. Charge RN [employer] 2nd witness.

## 2021-06-15 NOTE — Progress Notes (Signed)
NAME:  Kathleen Cameron, MRN:  [id number], DOB:  14-Oct-1965, LOS: 5 ADMISSION DATE:  06/14/2021, CONSULTATION DATE:  06/10/2021 REFERRING MD:  Ralene Bathe - EDP CHIEF COMPLAINT: Chest pain  History of Present Illness:  56 year old woman who presented to [facility] ED 2/19 with chest pain. PMHx significant for HTN, severe COPD (followed by Dr. Lamonte Sakai), chronic respiratory failure with tracheostomy in place, anxiety.  Prior to arrival, patient recently had change in ventilation from TV 480cc down to TV of 400cc. She has presented to the ED 3 times in the past 24 hours.  Discharged from the ED at 1500 after presenting with abdominal pain.  CT Chest/A/P negative for acute abnormality within the chest, abdomen, or pelvis. Presented again at 2200 with chest pain.  ED course notable for stable troponin (18 > 14), slightly elevated D-dimer, CTA Chest negative for PE but with concern for acute airway narrowing and opacification at the left mainstem bronchus, left lower lobar bronchus and lower subsegmental lower lobe airways.   PCCM was consulted for admission.    Pertinent Medical History:  Severe COPD, Chronic respiratory failure with tracheostomy in place, hypertension, anxiety  Significant Hospital Events: Including procedures, antibiotic start and stop dates in addition to other pertinent events   2/19-20: presented to ED x3 times. Abd pain: CT chest/abd/pelvis negative for acute process. CT chest: concern for L airway narrowing.  2/21 restless and anxious on vent overnight, received one dose paralytic and fentanyl.  Bronch demonstrating dynamic collapse of bilateral mainstem. 2/22 Improved vent mechanics, remains on Propofol/Fentanyl; Cortrak placement for nutritional support. 2/23 Vent dyssynchrony/breath stacking with increased pressures on vent early AM. Resolved with additional dose roc, sedation adjustment. 2/24 Ongoing vent dyssynchrony throughout the last 24 hours requiring 6 PRN doses of roc.    Interim History / Subjective:   Good diuresis.  Last dose of paralytic was at 8 pm last night. None given overnight. Continues to have TF output from oropharynx  Objective:  Blood pressure (!) 114/31, pulse 66, temperature (!) 97.3 F (36.3 C), temperature source Rectal, resp. rate (!) 24, weight 63.9 kg, SpO2 99 %. CVP:  [15 mmHg-26 mmHg] 15 mmHg  Vent Mode: PRVC FiO2 (%):  [40 %] 40 % Set Rate:  [24 bmp] 24 bmp Vt Set:  [440 mL] 440 mL PEEP:  [15 cmH20] 15 cmH20 Plateau Pressure:  [29 cmH20-32 cmH20] 32 cmH20   Intake/Output Summary (Last 24 hours) at 06/15/2021 0835 Last data filed at 06/15/2021 0700 Gross per 24 hour  Intake 2682.32 ml  Output 5010 ml  Net -2327.68 ml   Filed Weights   06/13/21 0427 06/14/21 0216 06/15/21 0302  Weight: 59 kg 60.8 kg 63.9 kg   Physical Exam: General: Thin critically ill-appearing, sedated HENT: [place], AT, OP clear, MMM Eyes: EOMI, no scleral icterus Respiratory: Clear to auscultation bilaterally.  No crackles, wheezing or rales Cardiovascular: RRR, -M/R/G, no JVD GI: BS+, soft, nontender Extremities: 1+ pitting edema in lower extremities,-tenderness Neuro: Sedated, PERRL, +gag/cough GU: Foley in place  Resolved Hospital Problem List:  Abdominal pain - CT A/P negative for acute process.  History of PEG tube.  Lipase normal. Chest pain  Assessment & Plan:   Agitation requiring sedation Anxiety -PAD protocol with goal RASS -1: Start Precedex. Wean propofol first. 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 Lamonte Sakai); trach- and vent-dependent PTA. Home vent  settings: TV 400cc, RR 16, PEEP 5, O2 2-6L. Husband states that symptoms initially noted on with vent changes made 2/17 [facility] Of note, home ventilator power cord was lost  during transport. Home vent out of battery. EMS contacted to search for cord. L mainstem narrowing and opacification on CT Chest. Bedside bronch demonstrated bilateral dynamic collapse of both mainstem.  Extended RVP negative. - Continue full vent support (maintain current vent settings with exception of FiO2) - Wean FiO2 and PEEP for O2 sat > 90%. Start weaning PEEP - DC paralytics - Diurese - Continue bronchodilators: Pulmicort, Brovana, Yupelri, PRN albuterol - VAP bundle - Pulmonary hygiene - Continue Cefepime for 7 day course (3/1) - Plan for tracheostomy exchange prior to discharge once patient more stable (followed by [name], NP)  Septic shock vs sedation related hypotension History of hypertension - Goal MAP > 65 - Wean levophed - Hold home amlodipine  Hyperkalemia - Diurese - Trend  PEG tube site wound, POA - WOC consult, appreciate recs - Local wound care  Hypothyroidism - Continue Synthroid  Chronic, normocytic anemia No bleeding noted, query nutrition-related vs. ACD. - Trend H&H, stable at present - Transfuse for Hgb < 7.0 or hemodynamically significant bleeding - Monitor for signs/symptoms of active bleeding  Inadequate oral intake related to acute illness At risk for malnutrition PEG removed PTA. Previously eating well on vent, now with decreased PO intake/inability to take PO due to critical illness/increased vent requirements. S/p Cortrak placement 2/22. - Appreciate Nutrition/RD assistance - Hold TF in setting of gastric output  Best Practice (right click and "Reselect all SmartList Selections" daily)   Diet/type: NPO, on LIWS DVT prophylaxis: prophylactic heparin  GI prophylaxis: PPI Lines: N/A Foley:  N/A Code Status:  full code Last date of multidisciplinary goals of care discussion 2/21  Critical care time: 70 minutes   The patient is critically ill with multiple organ systems failure and requires high complexity decision making for  assessment and support, frequent evaluation and titration of therapies, application of advanced monitoring technologies and extensive interpretation of multiple databases.    Rodman Pickle, M.D. [facility] Pulmonary/Critical Care Medicine 06/15/2021 8:35 AM   Please see Amion for pager number to reach on-call Pulmonary and Critical Care Team.

## 2021-06-15 NOTE — Progress Notes (Signed)
Notified of vent dyssynchrony with double stacked breaths.  Pt had been given PRN vecuronium, but problem persisted, with consistent double triggered breaths, even as she was already paralyzed.  RT called, and filters were changed, which resolved the problem.  Given that she is paralyzed, will change sedation from precedex to propofol.

## 2021-06-15 NOTE — Progress Notes (Signed)
°   06/15/21 0739  Tracheostomy 8 mm Cuffed  No placement date or time found.   Inserted prior to hospital arrival?: Other (Comment)  Size (mm): 8 mm  Style: Cuffed  Status Secured  Site Assessment Clean  Inner Cannula Care No inner cannula  Ties Assessment Clean, Dry, Secure  Cuff Pressure (cm H2O) Clear OR 27-39 CmH2O  Tracheostomy Equipment at bedside Yes and checklist posted at head of bed  Adult Ventilator Settings  Vent Type Servo i  Humidity HME  Vent Mode PRVC  Vt Set 440 mL  Set Rate 24 bmp  FiO2 (%) 40 %  I Time 0.8 Sec(s)  PEEP 15 cmH20  Adult Ventilator Measurements  Peak Airway Pressure 38 L/min  Mean Airway Pressure 22 cmH20  Plateau Pressure 32 cmH20  Resp Rate Spontaneous 0 br/min  Resp Rate Total 24 br/min  Exhaled Vt 442 mL  Measured Ve 10.5 mL  I:E Ratio Measured 1:2.1  SpO2 99 %  Auto PEEP 0 cmH20  Total PEEP 15 cmH20  Adult Ventilator Alarms  Alarms On Y  Ve High Alarm 14 L/min  Ve Low Alarm 6 L/min  Resp Rate High Alarm 38 br/min  Resp Rate Low Alarm 22  PEEP Low Alarm 13 cmH2O  Press High Alarm 45 cmH2O  Daily Weaning Assessment  Daily Assessment of Readiness to Wean Wean protocol criteria not met  Reason not met PEEP > 8  Breath Sounds  Bilateral Breath Sounds Diminished  Airway Suctioning/Secretions  Suction Type Tracheal  Suction Device  Catheter  Secretion Amount Moderate  Secretion Color White  Secretion Consistency Thick  Suction Tolerance Tolerated well  Suctioning Adverse Effects None

## 2021-06-16 ENCOUNTER — Encounter (HOSPITAL_COMMUNITY): Payer: Self-pay | Admitting: Internal Medicine

## 2021-06-16 DIAGNOSIS — J9611 Chronic respiratory failure with hypoxia: Secondary | ICD-10-CM | POA: Diagnosis not present

## 2021-06-16 LAB — GLUCOSE, CAPILLARY
Glucose-Capillary: 107 mg/dL — ABNORMAL HIGH (ref 70–99)
Glucose-Capillary: 108 mg/dL — ABNORMAL HIGH (ref 70–99)
Glucose-Capillary: 112 mg/dL — ABNORMAL HIGH (ref 70–99)
Glucose-Capillary: 126 mg/dL — ABNORMAL HIGH (ref 70–99)
Glucose-Capillary: 136 mg/dL — ABNORMAL HIGH (ref 70–99)
Glucose-Capillary: 86 mg/dL (ref 70–99)
Glucose-Capillary: 91 mg/dL (ref 70–99)

## 2021-06-16 LAB — BASIC METABOLIC PANEL
Anion gap: 9 (ref 5–15)
BUN: 41 mg/dL — ABNORMAL HIGH (ref 6–20)
CO2: 30 mmol/L (ref 22–32)
Calcium: 9 mg/dL (ref 8.9–10.3)
Chloride: 100 mmol/L (ref 98–111)
Creatinine, Ser: 1.14 mg/dL — ABNORMAL HIGH (ref 0.44–1.00)
GFR, Estimated: 56 mL/min — ABNORMAL LOW (ref >60)
Glucose, Bld: 93 mg/dL (ref 70–99)
Potassium: 4.1 mmol/L (ref 3.5–5.1)
Sodium: 139 mmol/L (ref 135–145)

## 2021-06-16 LAB — POCT I-STAT 7, (LYTES, BLD GAS, ICA,H+H)
Acid-Base Excess: 6 mmol/L — ABNORMAL HIGH (ref 0.0–2.0)
Bicarbonate: 33.7 mmol/L — ABNORMAL HIGH (ref 20.0–28.0)
Calcium, Ion: 1.32 mmol/L (ref 1.15–1.40)
HCT: 30 % — ABNORMAL LOW (ref 36.0–46.0)
Hemoglobin: 10.2 g/dL — ABNORMAL LOW (ref 12.0–15.0)
O2 Saturation: 95 %
Patient temperature: 36.8
Potassium: 4.1 mmol/L (ref 3.5–5.1)
Sodium: 139 mmol/L (ref 135–145)
TCO2: 36 mmol/L — ABNORMAL HIGH (ref 22–32)
pCO2 arterial: 67.3 mmHg (ref 32–48)
pH, Arterial: 7.307 — ABNORMAL LOW (ref 7.35–7.45)
pO2, Arterial: 88 mmHg (ref 83–108)

## 2021-06-16 LAB — CBC
HCT: 30.2 % — ABNORMAL LOW (ref 36.0–46.0)
Hemoglobin: 9.2 g/dL — ABNORMAL LOW (ref 12.0–15.0)
MCH: 27 pg (ref 26.0–34.0)
MCHC: 30.5 g/dL (ref 30.0–36.0)
MCV: 88.6 fL (ref 80.0–100.0)
Platelets: 197 10*3/uL (ref 150–400)
RBC: 3.41 MIL/uL — ABNORMAL LOW (ref 3.87–5.11)
RDW: 17.2 % — ABNORMAL HIGH (ref 11.5–15.5)
WBC: 8 10*3/uL (ref 4.0–10.5)
nRBC: 0 % (ref 0.0–0.2)

## 2021-06-16 LAB — CULTURE, BLOOD (ROUTINE X 2)
Culture: NO GROWTH
Culture: NO GROWTH
Special Requests: ADEQUATE

## 2021-06-16 LAB — TRIGLYCERIDES: Triglycerides: 142 mg/dL (ref <150)

## 2021-06-16 MED ORDER — CLONAZEPAM 0.5 MG PO TABS
0.5000 mg | ORAL_TABLET | Freq: Once | ORAL | Status: AC
  Administered 2021-06-16: 0.5 mg via ORAL
  Filled 2021-06-16: qty 1

## 2021-06-16 MED ORDER — DOCUSATE SODIUM 50 MG/5ML PO LIQD
100.0000 mg | Freq: Two times a day (BID) | ORAL | Status: DC | PRN
  Administered 2021-06-19: 100 mg
  Filled 2021-06-16: qty 10

## 2021-06-16 MED ORDER — DEXMEDETOMIDINE HCL IN NACL 400 MCG/100ML IV SOLN
0.0000 ug/kg/h | INTRAVENOUS | Status: DC
  Administered 2021-06-17 (×2): 0.5 ug/kg/h via INTRAVENOUS
  Administered 2021-06-18: 1.2 ug/kg/h via INTRAVENOUS
  Administered 2021-06-18: 0.5 ug/kg/h via INTRAVENOUS
  Administered 2021-06-18 – 2021-06-19 (×2): 1.2 ug/kg/h via INTRAVENOUS
  Filled 2021-06-16 (×5): qty 100

## 2021-06-16 MED ORDER — GUAIFENESIN 100 MG/5ML PO LIQD
15.0000 mL | Freq: Four times a day (QID) | ORAL | Status: DC
  Administered 2021-06-16 – 2021-06-24 (×32): 15 mL
  Filled 2021-06-16 (×32): qty 15

## 2021-06-16 MED ORDER — ACETAMINOPHEN 160 MG/5ML PO SOLN: 650.0000 mg | Freq: Four times a day (QID) | ORAL | Status: DC | PRN

## 2021-06-16 MED ORDER — FUROSEMIDE 10 MG/ML IJ SOLN
40.0000 mg | Freq: Two times a day (BID) | INTRAMUSCULAR | Status: AC
Start: 2021-06-16 — End: 2021-06-16
  Administered 2021-06-16 (×2): 40 mg via INTRAVENOUS
  Filled 2021-06-16 (×2): qty 4

## 2021-06-16 MED ORDER — CLONAZEPAM 1 MG PO TABS
1.0000 mg | ORAL_TABLET | Freq: Three times a day (TID) | ORAL | Status: DC
  Administered 2021-06-16 – 2021-06-24 (×24): 1 mg
  Filled 2021-06-16 (×24): qty 1

## 2021-06-16 NOTE — Progress Notes (Signed)
Pharmacy Antibiotic Note  Kathleen Cameron is a 56 y.o. female admitted on 06/10/2021 with pneumonia.  Pharmacy has been consulted for cefepime dosing.  Previously on ceftriaxone, azthromycin and doxycycline.  Respiratory cultures growing abundant pseudomonas and proteus.  Pharmacy was asked to switch antibiotics to cefepime on 2/23.  Plan: Cefepime 2g IV q 12 hrs. F/u sensitivities.  Height: _0  (165.1 cm) Weight: 65.2 kg (143 lb 11.8 oz) IBW/kg (Calculated) : 57  Temp (24hrs), Avg:98.6 F (37 C), Min:97 F (36.1 C), Max:99.5 F (37.5 C)  Recent Labs  Lab 06/12/21 0419 06/12/21 1625 06/13/21 0416 06/13/21 0750 06/13/21 1032 06/13/21 1632 06/14/21 0056 06/14/21 1240 06/14/21 1559 06/15/21 0207 06/15/21 1600 06/16/21 0205 06/16/21 0925  WBC 9.6  --  17.0*  --   --   --  9.3  --   --  6.4  --  8.0  --   CREATININE 1.31*   < >  --    < >  --    < > 1.25*  --  1.28* 1.40* 1.37*  --  1.14*  LATICACIDVEN  --   --   --   --  0.8  --   --  0.6 0.8  --   --   --   --    < > = values in this interval not displayed.     Estimated Creatinine Clearance: 49 mL/min (A) (by C-G formula based on SCr of 1.14 mg/dL (H)).    Allergies  Allergen Reactions   Stiolto Respimat [Tiotropium Bromide-Olodaterol] Other (See Comments)    Severe headaches and rapid heartrate    Antimicrobials this admission: Azithro 2/20 > 2/21 Doxy 2/20 > 2/23  Ceftriaxone 2/21 > 2/23 Cefepime 2/23 >   Dose adjustments this admission:  Microbiology results: 2/21 BCx > negF 2/21 Resp virus panel - neg 2/21 - BAL - abundant proteus mirabilis, abundant pseudomonas  Thank you for allowing pharmacy to be a part of this patients care.  [place] Crane, Roylene Reason, BCCP Clinical Pharmacist  06/16/2021 2:01 PM   [facility] pharmacy phone numbers are listed on [email or web address]

## 2021-06-16 NOTE — Progress Notes (Signed)
NAME:  Dasiya Helder, MRN:  [id number], DOB:  [birth year], LOS: 6 ADMISSION DATE:  05/23/2021, CONSULTATION DATE:  06/10/2021 REFERRING MD:  Ralene Bathe - EDP CHIEF COMPLAINT: Chest pain  History of Present Illness:  56 year old woman who presented to [facility] ED 2/19 with chest pain. PMHx significant for HTN, severe COPD (followed by Dr. Lamonte Sakai), chronic respiratory failure with tracheostomy in place, anxiety.  Prior to arrival, patient recently had change in ventilation from TV 480cc down to TV of 400cc. She has presented to the ED 3 times in the past 24 hours.  Discharged from the ED at 1500 after presenting with abdominal pain.  CT Chest/A/P negative for acute abnormality within the chest, abdomen, or pelvis. Presented again at 2200 with chest pain.  ED course notable for stable troponin (18 > 14), slightly elevated D-dimer, CTA Chest negative for PE but with concern for acute airway narrowing and opacification at the left mainstem bronchus, left lower lobar bronchus and lower subsegmental lower lobe airways.   PCCM was consulted for admission.    Pertinent Medical History:  Severe COPD, Chronic respiratory failure with tracheostomy in place, hypertension, anxiety  Significant Hospital Events: Including procedures, antibiotic start and stop dates in addition to other pertinent events   2/19-20: presented to ED x3 times. Abd pain: CT chest/abd/pelvis negative for acute process. CT chest: concern for L airway narrowing.  2/21 restless and anxious on vent overnight, received one dose paralytic and fentanyl.  Bronch demonstrating dynamic collapse of bilateral mainstem. 2/22 Improved vent mechanics, remains on Propofol/Fentanyl; Cortrak placement for nutritional support. 2/23 Vent dyssynchrony/breath stacking with increased pressures on vent early AM. Resolved with additional dose roc, sedation adjustment. 2/24 Ongoing vent dyssynchrony throughout the last 24 hours requiring 6 PRN doses of roc.  2/25  Patient on minimal vent settings and clinical improvement with pneumonia. Attempted to wean off sedation however had increased peak pressures due to EDAC and bronchospasm. Required vec 4PM and 7PM.  Interim History / Subjective:  Patient on minimal vent settings and clinical improvement with pneumonia. Attempted to wean off sedation however had increased peak pressures due to EDAC and bronchospasm. Required vec 4PM and 7PM.  Objective:  Blood pressure 131/75, pulse 81, temperature 98.6 F (37 C), resp. rate (!) 24, height 5\' 5"  (1.651 m), weight 65.2 kg, SpO2 100 %.    Vent Mode: PRVC FiO2 (%):  [40 %] 40 % Set Rate:  [24 bmp] 24 bmp Vt Set:  [440 mL] 440 mL PEEP:  [8 cmH20-12 cmH20] 8 cmH20 Plateau Pressure:  [17 cmH20-28 cmH20] 24 cmH20   Intake/Output Summary (Last 24 hours) at 06/16/2021 0920 Last data filed at 06/16/2021 0900 Gross per 24 hour  Intake 2100.17 ml  Output 2475 ml  Net -374.83 ml   Filed Weights   06/14/21 0216 06/15/21 0302 06/16/21 0500  Weight: 60.8 kg 63.9 kg 65.2 kg   Physical Exam: General: Thin critically ill appearing, no acute distress HENT: [place], AT, OP clear, MMM, exophthalmos Neck:Trach in place, c/d/i Eyes: EOMI, no scleral icterus Respiratory: Coarse breath sounds to auscultation bilaterally.  No wheezing  Cardiovascular: RRR, -M/R/G, no JVD GI: BS+, soft, nontender Extremities:-Edema,-tenderness Neuro: Opens eyes to voice, blinks to threat, grimaces to pain, +cough/gag GU: Foley in place   Resolved Hospital Problem List:  Abdominal pain - CT A/P negative for acute process.  History of PEG tube.  Lipase normal. Chest pain  Assessment & Plan:   Agitation requiring sedation Anxiety -PAD protocol with goal RASS -  1: Start Precedex. Wean propofol first. Continue fentanyl -Increase klonopin 1 mg TID  Acute-on-chronic hypercarbic respiratory failure 2/2 PA and Proteus PNA COPD exacerbation End-stage COPD with chronic ventilator  dependence Tracheostomy status Baseline end-stage COPD (followed by Dr. Lamonte Sakai); trach- and vent-dependent PTA. Home vent settings: TV 400cc, RR 16, PEEP 5, O2 2-6L. Husband states that symptoms initially noted on with vent changes made 2/17 [facility]). Of note, home ventilator power cord was lost during transport. Home vent out of battery. EMS contacted to search for cord. L mainstem narrowing and opacification on CT Chest. Bedside bronch demonstrated bilateral dynamic collapse of both mainstem.  Extended RVP negative. - Continue full vent support. Wean FiO2 and PEEP for O2 sat > 90% - Continue bronchodilators: Pulmicort, Brovana, Yupelri, PRN albuterol - Reviewed ABG. Improved ventilation and adequate oxygenation however continues to bronchoscospasms - Diurese for +CFB 6L - VAP bundle - Pulmonary hygiene - Continue Cefepime for 7 day course (3/1) - Plan for tracheostomy exchange prior to discharge once patient more stable (followed by [name], NP)  Septic shock vs sedation related hypotension - resolved History of hypertension - Goal MAP > 65 - Off levophed - Hold home amlodipine  Hyperkalemia - resolved - Diurese - Trend  PEG tube site wound, POA - WOC consult, appreciate recs - Local wound care  Hypothyroidism - Continue Synthroid  Chronic, normocytic anemia No bleeding noted, query nutrition-related vs. ACD. - Trend H&H, stable at present - Transfuse for Hgb < 7.0 or hemodynamically significant bleeding - Monitor for signs/symptoms of active bleeding  Inadequate oral intake related to acute illness At risk for malnutrition PEG removed PTA. Previously eating well on vent, now with decreased PO intake/inability to take PO due to critical illness/increased vent requirements. S/p Cortrak placement 2/22. - Appreciate Nutrition/RD assistance - Hold TF in setting of gastric output  Best Practice (right click and "Reselect all SmartList Selections" daily)   Diet/type:  NPO DVT prophylaxis: prophylactic heparin  GI prophylaxis: PPI Lines: N/A Foley:  N/A Code Status:  full code Last date of multidisciplinary goals of care discussion 2/21  Critical care time: 90 minutes   The patient is critically ill with acute respiratory failure and requires high complexity decision making for assessment and support, frequent evaluation and titration of therapies including vent titration, application of advanced monitoring technologies and extensive interpretation of multiple databases.  Independent Critical Care Time: 90 Minutes.   Rodman Pickle, M.D. [facility] Pulmonary/Critical Care Medicine 06/16/2021 9:21 AM   Please see Amion for pager number to reach on-call Pulmonary and Critical Care Team.

## 2021-06-17 ENCOUNTER — Inpatient Hospital Stay (HOSPITAL_COMMUNITY): Payer: Medicare Other

## 2021-06-17 DIAGNOSIS — J9611 Chronic respiratory failure with hypoxia: Secondary | ICD-10-CM | POA: Diagnosis not present

## 2021-06-17 DIAGNOSIS — Z9911 Dependence on respirator [ventilator] status: Secondary | ICD-10-CM

## 2021-06-17 LAB — CBC
HCT: 30.6 % — ABNORMAL LOW (ref 36.0–46.0)
Hemoglobin: 9.4 g/dL — ABNORMAL LOW (ref 12.0–15.0)
MCH: 26.9 pg (ref 26.0–34.0)
MCHC: 30.7 g/dL (ref 30.0–36.0)
MCV: 87.7 fL (ref 80.0–100.0)
Platelets: 193 10*3/uL (ref 150–400)
RBC: 3.49 MIL/uL — ABNORMAL LOW (ref 3.87–5.11)
RDW: 16.9 % — ABNORMAL HIGH (ref 11.5–15.5)
WBC: 6.6 10*3/uL (ref 4.0–10.5)
nRBC: 0 % (ref 0.0–0.2)

## 2021-06-17 LAB — BASIC METABOLIC PANEL
Anion gap: 7 (ref 5–15)
BUN: 38 mg/dL — ABNORMAL HIGH (ref 6–20)
CO2: 34 mmol/L — ABNORMAL HIGH (ref 22–32)
Calcium: 9.5 mg/dL (ref 8.9–10.3)
Chloride: 101 mmol/L (ref 98–111)
Creatinine, Ser: 1.07 mg/dL — ABNORMAL HIGH (ref 0.44–1.00)
GFR, Estimated: >60 mL/min (ref >60)
Glucose, Bld: 95 mg/dL (ref 70–99)
Potassium: 3.5 mmol/L (ref 3.5–5.1)
Sodium: 142 mmol/L (ref 135–145)

## 2021-06-17 LAB — POCT I-STAT 7, (LYTES, BLD GAS, ICA,H+H)
Acid-Base Excess: 12 mmol/L — ABNORMAL HIGH (ref 0.0–2.0)
Bicarbonate: 40 mmol/L — ABNORMAL HIGH (ref 20.0–28.0)
Calcium, Ion: 1.27 mmol/L (ref 1.15–1.40)
HCT: 30 % — ABNORMAL LOW (ref 36.0–46.0)
Hemoglobin: 10.2 g/dL — ABNORMAL LOW (ref 12.0–15.0)
O2 Saturation: 97 %
Patient temperature: 36.4
Potassium: 4.1 mmol/L (ref 3.5–5.1)
Sodium: 141 mmol/L (ref 135–145)
TCO2: 42 mmol/L — ABNORMAL HIGH (ref 22–32)
pCO2 arterial: 74.6 mmHg (ref 32–48)
pH, Arterial: 7.335 — ABNORMAL LOW (ref 7.35–7.45)
pO2, Arterial: 97 mmHg (ref 83–108)

## 2021-06-17 LAB — GLUCOSE, CAPILLARY
Glucose-Capillary: 99 mg/dL (ref 70–99)
Glucose-Capillary: 108 mg/dL — ABNORMAL HIGH (ref 70–99)
Glucose-Capillary: 123 mg/dL — ABNORMAL HIGH (ref 70–99)
Glucose-Capillary: 141 mg/dL — ABNORMAL HIGH (ref 70–99)

## 2021-06-17 LAB — MAGNESIUM: Magnesium: 2.4 mg/dL (ref 1.7–2.4)

## 2021-06-17 MED ORDER — METHYLPREDNISOLONE SODIUM SUCC 125 MG IJ SOLR
125.0000 mg | Freq: Two times a day (BID) | INTRAMUSCULAR | Status: DC
  Administered 2021-06-17 – 2021-06-18 (×4): 125 mg via INTRAVENOUS
  Filled 2021-06-17 (×4): qty 2

## 2021-06-17 MED ORDER — PROSOURCE TF PO LIQD
45.0000 mL | Freq: Two times a day (BID) | ORAL | Status: DC
  Administered 2021-06-17 – 2021-06-24 (×15): 45 mL
  Filled 2021-06-17 (×15): qty 45

## 2021-06-17 MED ORDER — OSMOLITE 1.2 CAL PO LIQD
1000.0000 mL | ORAL | Status: DC
  Administered 2021-06-17: 1000 mL
  Filled 2021-06-17: qty 1000

## 2021-06-17 MED ORDER — ROCURONIUM BROMIDE 50 MG/5ML IV SOLN
1.0000 mg/kg | Freq: Once | INTRAVENOUS | Status: AC
  Filled 2021-06-17: qty 6.16

## 2021-06-17 MED ORDER — VITAL HIGH PROTEIN PO LIQD: 1000.0000 mL | ORAL | Status: DC

## 2021-06-17 MED ORDER — POTASSIUM CHLORIDE 20 MEQ PO PACK
40.0000 meq | PACK | Freq: Once | ORAL | Status: AC
  Administered 2021-06-17: 40 meq
  Filled 2021-06-17: qty 2

## 2021-06-17 MED ORDER — ROCURONIUM BROMIDE 10 MG/ML (PF) SYRINGE
PREFILLED_SYRINGE | INTRAVENOUS | Status: AC
  Administered 2021-06-17: 61.6 mg via INTRAVENOUS
  Filled 2021-06-17: qty 10

## 2021-06-17 MED ORDER — MAGNESIUM SULFATE 2 GM/50ML IV SOLN
2.0000 g | Freq: Once | INTRAVENOUS | Status: AC
  Administered 2021-06-17: 2 g via INTRAVENOUS
  Filled 2021-06-17: qty 50

## 2021-06-17 MED ORDER — METHYLPREDNISOLONE SODIUM SUCC 125 MG IJ SOLR
50.0000 mg | Freq: Every day | INTRAMUSCULAR | Status: DC
  Filled 2021-06-17: qty 2

## 2021-06-17 MED ORDER — ALBUTEROL SULFATE (2.5 MG/3ML) 0.083% IN NEBU
2.5000 mg | INHALATION_SOLUTION | RESPIRATORY_TRACT | Status: DC
  Administered 2021-06-17 – 2021-06-19 (×11): 2.5 mg via RESPIRATORY_TRACT
  Filled 2021-06-17 (×12): qty 3

## 2021-06-17 MED ORDER — FUROSEMIDE 10 MG/ML IJ SOLN
40.0000 mg | Freq: Two times a day (BID) | INTRAMUSCULAR | Status: DC
  Administered 2021-06-17 – 2021-06-19 (×5): 40 mg via INTRAVENOUS
  Filled 2021-06-17 (×5): qty 4

## 2021-06-17 NOTE — Progress Notes (Signed)
Re-examined vent after rocuronium to eliminate expiratory effort. Plat 20, DP 10. Varying PEEP levels from 8-12. At 8 lots of oscillation in expiratory flow which are improved on PEEP 10 and nearly completely eliminated on PEEP 12. Inspiratory time decreased, rate decreased . Auto-PEEP ~2.  18/440/12/50% vent settings currently. D/w RT.   Con't steroids, albuterol, LABA, LAMA, ICS. Trying to minimize NMB use.   Steffanie Dunn, DO 06/17/21 11:41 AM [place]

## 2021-06-17 NOTE — Progress Notes (Signed)
[facility] ADULT ICU REPLACEMENT PROTOCOL   The patient does apply for the [facility] Adult ICU Electrolyte Replacment Protocol based on the criteria listed below:   1.Exclusion criteria: TCTS patients, ECMO patients, and Dialysis patients 2. Is GFR >/= 30 ml/min? Yes.    Patient's GFR today is >60 3. Is SCr </= 2? Yes.   Patient's SCr is 1.07 mg/dL 4. Did SCr increase >/= 0.5 in 24 hours? No. 5.Pt's weight >40kg  Yes.   6. Abnormal electrolyte(s): K+3.5  7. Electrolytes replaced per protocol 8.  Call MD STAT for K+ </= 2.5, Phos </= 1, or Mag </= 1 Physician:  Kendell Bane 06/17/2021 5:46 AM

## 2021-06-17 NOTE — Progress Notes (Incomplete)
Nutrition Follow-up  DOCUMENTATION CODES:   Not applicable  INTERVENTION:  *** Tube Feeding via Cortrak:  Begin Osmolite 1.5 at 10 ml/hr Goal rate   NUTRITION DIAGNOSIS:   Inadequate oral intake related to acute illness as evidenced by NPO status.  Being addressed via TF   GOAL:   Patient will meet greater than or equal to 90% of their needs  Progressing  MONITOR:   Diet advancement, Labs, Weight trends  REASON FOR ASSESSMENT:   Ventilator    ASSESSMENT:   56 yo female admitted with acute on chronic respiratory failure, vent/trach at baseline. PMH includes severe COPD, chronic respiratory failure-vent/trach at home, anxiety, HTN  Pt remains on vent support via trach  2/24 KUB-no ileus  Labs: Meds:     Diet Order:   Diet Order             Diet NPO time specified  Diet effective midnight                   EDUCATION NEEDS:   Not appropriate for education at this time  Skin:  Skin Assessment: Reviewed RN Assessment (no pressure injuries noted)  Last BM:  PTA  Height:   Ht Readings from Last 1 Encounters:  06/16/21 5\' 5"  (1.651 m)    Weight:   Wt Readings from Last 1 Encounters:  06/17/21 61.6 kg    Ideal Body Weight:     BMI:  Body mass index is 22.6 kg/m.  Estimated Nutritional Needs:   Kcal:  1600-1800 kcals  Protein:  80-90 g  Fluid:  >/= 1.6 L    ***

## 2021-06-17 NOTE — Consult Note (Signed)
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  Pressure Injury POA: NA Wound bed: pale pink Drainage (amount, consistency, odor) None Periwound: Moist Dressing procedure/placement/frequency: Cleanse the buttocks area with no rinse cleanser. Pat dry and continue to apply triple paste to the area as a moisture barrier. Apply PRN.   Monitor the wound area(s) for worsening of condition such as: Signs/symptoms of infection, increase in size, development of or worsening of odor, development of pain, or increased pain at the affected locations.   Notify the medical team if any of these develop.  Thank you for the consult. WOC nurse will not follow at this time.   Please re-consult the WOC team if needed.  Renaldo Reel Katrinka Blazing, MSN, RN, CMSRN, Angus Seller, [facility] Wound Treatment Associate Pager [phone]

## 2021-06-17 NOTE — Progress Notes (Signed)
NAME:  Kathleen Cameron, MRN:  [id number], DOB:  February 26, 1966, LOS: 7 ADMISSION DATE:  05/31/2021, CONSULTATION DATE:  06/10/2021 REFERRING MD:  Ralene Bathe - EDP CHIEF COMPLAINT: Chest pain  History of Present Illness:  56 year old woman who presented to [facility] ED 2/19 with chest pain. PMHx significant for HTN, severe COPD (followed by Dr. Lamonte Sakai), chronic respiratory failure with tracheostomy in place, anxiety.  Prior to arrival, patient recently had change in ventilation from TV 480cc down to TV of 400cc. She has presented to the ED 3 times in the past 24 hours.  Discharged from the ED at 1500 after presenting with abdominal pain.  CT Chest/A/P negative for acute abnormality within the chest, abdomen, or pelvis. Presented again at 2200 with chest pain.  ED course notable for stable troponin (18 > 14), slightly elevated D-dimer, CTA Chest negative for PE but with concern for acute airway narrowing and opacification at the left mainstem bronchus, left lower lobar bronchus and lower subsegmental lower lobe airways.   PCCM was consulted for admission.    Pertinent Medical History:  Severe COPD, Chronic respiratory failure with tracheostomy in place, hypertension, anxiety  Significant Hospital Events: Including procedures, antibiotic start and stop dates in addition to other pertinent events   2/19-20: presented to ED x3 times. Abd pain: CT chest/abd/pelvis negative for acute process. CT chest: concern for L airway narrowing.  2/21 restless and anxious on vent overnight, received one dose paralytic and fentanyl.  Bronch demonstrating dynamic collapse of bilateral mainstem. 2/22 Improved vent mechanics, remains on Propofol/Fentanyl; Cortrak placement for nutritional support. 2/23 Vent dyssynchrony/breath stacking with increased pressures on vent early AM. Resolved with additional dose roc, sedation adjustment. 2/24 Ongoing vent dyssynchrony throughout the last 24 hours requiring 6 PRN doses of roc.  2/25  Patient on minimal vent settings and clinical improvement with pneumonia. Attempted to wean off sedation however had increased peak pressures due to EDAC and bronchospasm. Required vec 4PM and 7PM.  Interim History / Subjective:  Remains on Fentanyl and Precedex gtt this AM. On examination with tachypnea, tachycardia, Hypoxia, and HTN requiring PRN Fentanyl and Bagging per RT.   Objective:  Blood pressure 108/73, pulse 77, temperature 98.6 F (37 C), resp. rate (!) 24, height 5\' 5"  (1.651 m), weight 61.6 kg, SpO2 97 %.    Vent Mode: PRVC FiO2 (%):  [40 %] 40 % Set Rate:  [24 bmp] 24 bmp Vt Set:  [440 mL] 440 mL PEEP:  [5 cmH20-8 cmH20] 8 cmH20 Plateau Pressure:  [22 cmH20-35 cmH20] 35 cmH20   Intake/Output Summary (Last 24 hours) at 06/17/2021 1008 Last data filed at 06/17/2021 0700 Gross per 24 hour  Intake 1108.8 ml  Output 2601 ml  Net -1492.2 ml   Filed Weights   06/15/21 0302 06/16/21 0500 06/17/21 0445  Weight: 63.9 kg 65.2 kg 61.6 kg   Physical Exam: General: Critically ill appearing adult female  HEENT: Trach in place  Respiratory: Diminished, coarse breath sounds  Cardiovascular: RRR, -M/R/G, no JVD GI: BS+, soft, nontender Extremities:-Edema,-tenderness Neuro: sedated, +cough/gag, does not follow commands  GU: Foley in place   Resolved Hospital Problem List:  Abdominal pain - CT A/P negative for acute process.  History of PEG tube.  Lipase normal. 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 Lamonte Sakai); trach- and vent-dependent PTA. Home vent settings: TV 400cc, RR 16, PEEP 5, O2 2-6L. Husband states that  symptoms initially noted on with vent changes made 2/17 [facility]). Of note, home ventilator power cord was lost during transport. Home vent out of battery. EMS contacted to search for cord. L mainstem narrowing and opacification on CT Chest. Bedside bronch demonstrated bilateral dynamic collapse of both mainstem.  Extended RVP negative. Plan - Continue full vent support. Wean FiO2 and PEEP for O2 sat > 90% - Continue bronchodilators: Pulmicort, Brovana, Yupelri, PRN albuterol - Reviewed ABG. Improved ventilation and adequate oxygenation however continues to bronchoscospasms - Diurese >> Plan for Lasix 40 mg BID   - D/C Steroid taper and increase to 125 mg BID solu-medrol daily  - Repeat CXR pending  - VAP bundle - Pulmonary hygiene - Continue Cefepime for 7 day course (3/1) - Plan for tracheostomy exchange prior to discharge once patient more stable (followed by [name], NP)  Septic shock vs sedation related hypotension - resolved History of hypertension - Goal MAP > 65 Currently on low dose levophed)  - Hold home amlodipine  Hyperkalemia - resolved - Trend BMP   PEG tube site wound, POA - WOC consult, appreciate recs - Local wound care  Hypothyroidism - Continue Synthroid  Chronic, normocytic anemia No bleeding noted, query nutrition-related vs. ACD. - Trend H&H, stable at present - Transfuse for Hgb < 7.0 or hemodynamically significant bleeding - Monitor for signs/symptoms of active bleeding  Inadequate oral intake related to acute illness At risk for malnutrition PEG removed PTA. Previously eating well on vent, now with decreased PO intake/inability to take PO due to critical illness/increased vent requirements. S/p Cortrak placement 2/22. - Appreciate Nutrition/RD assistance - Hold TF in setting of gastric output  Best Practice (right click and "Reselect all SmartList Selections" daily)   Diet/type: tubefeeds DVT prophylaxis: prophylactic heparin  GI  prophylaxis: PPI Lines: Central line >> ?if we can d/c today  Foley:  Yes, and it is still needed Code Status:  full code Last date of multidisciplinary goals of care discussion 2/21  Critical care time: 42 minutes   The patient is critically ill with acute respiratory failure and requires high complexity decision making for assessment and support, frequent evaluation and titration of therapies including vent titration, application of advanced monitoring technologies and extensive interpretation of multiple databases.  Independent Critical Care Time: 42 Minutes.   Hayden Pedro, AGACNP-BC [place]  PCCM Pgr: [phone]

## 2021-06-17 NOTE — Progress Notes (Signed)
Vent changes made by Dr. Chestine Spore, CCM.

## 2021-06-18 ENCOUNTER — Inpatient Hospital Stay (HOSPITAL_COMMUNITY): Payer: Medicare Other

## 2021-06-18 ENCOUNTER — Ambulatory Visit (HOSPITAL_COMMUNITY): Payer: Medicare Other

## 2021-06-18 DIAGNOSIS — Z9911 Dependence on respirator [ventilator] status: Secondary | ICD-10-CM | POA: Diagnosis not present

## 2021-06-18 DIAGNOSIS — J9809 Other diseases of bronchus, not elsewhere classified: Secondary | ICD-10-CM | POA: Diagnosis not present

## 2021-06-18 DIAGNOSIS — J9611 Chronic respiratory failure with hypoxia: Secondary | ICD-10-CM | POA: Diagnosis not present

## 2021-06-18 LAB — CBC
HCT: 29.7 % — ABNORMAL LOW (ref 36.0–46.0)
Hemoglobin: 9.3 g/dL — ABNORMAL LOW (ref 12.0–15.0)
MCH: 27.9 pg (ref 26.0–34.0)
MCHC: 31.3 g/dL (ref 30.0–36.0)
MCV: 89.2 fL (ref 80.0–100.0)
Platelets: 193 10*3/uL (ref 150–400)
RBC: 3.33 MIL/uL — ABNORMAL LOW (ref 3.87–5.11)
RDW: 16.8 % — ABNORMAL HIGH (ref 11.5–15.5)
WBC: 6.6 10*3/uL (ref 4.0–10.5)
nRBC: 0 % (ref 0.0–0.2)

## 2021-06-18 LAB — GLUCOSE, CAPILLARY
Glucose-Capillary: 145 mg/dL — ABNORMAL HIGH (ref 70–99)
Glucose-Capillary: 114 mg/dL — ABNORMAL HIGH (ref 70–99)
Glucose-Capillary: 144 mg/dL — ABNORMAL HIGH (ref 70–99)
Glucose-Capillary: 154 mg/dL — ABNORMAL HIGH (ref 70–99)

## 2021-06-18 LAB — BASIC METABOLIC PANEL
Anion gap: 10 (ref 5–15)
BUN: 44 mg/dL — ABNORMAL HIGH (ref 6–20)
CO2: 34 mmol/L — ABNORMAL HIGH (ref 22–32)
Calcium: 9.4 mg/dL (ref 8.9–10.3)
Chloride: 99 mmol/L (ref 98–111)
Creatinine, Ser: 1.22 mg/dL — ABNORMAL HIGH (ref 0.44–1.00)
GFR, Estimated: 52 mL/min — ABNORMAL LOW (ref >60)
Glucose, Bld: 115 mg/dL — ABNORMAL HIGH (ref 70–99)
Potassium: 4.7 mmol/L (ref 3.5–5.1)
Sodium: 143 mmol/L (ref 135–145)

## 2021-06-18 LAB — PHOSPHORUS: Phosphorus: 5.2 mg/dL — ABNORMAL HIGH (ref 2.5–4.6)

## 2021-06-18 LAB — MAGNESIUM: Magnesium: 2.9 mg/dL — ABNORMAL HIGH (ref 1.7–2.4)

## 2021-06-18 MED ORDER — ROCURONIUM BROMIDE 50 MG/5ML IV SOLN
1.0000 mg/kg | Freq: Once | INTRAVENOUS | Status: AC
  Administered 2021-06-18: 58.8 mg via INTRAVENOUS
  Filled 2021-06-18: qty 5.88

## 2021-06-18 MED ORDER — VECURONIUM BROMIDE 10 MG IV SOLR
10.0000 mg | Freq: Once | INTRAVENOUS | Status: AC
  Administered 2021-06-18: 10 mg via INTRAVENOUS
  Filled 2021-06-18: qty 10

## 2021-06-18 MED ORDER — ROCURONIUM BROMIDE 10 MG/ML (PF) SYRINGE
PREFILLED_SYRINGE | INTRAVENOUS | Status: AC
  Filled 2021-06-18: qty 10

## 2021-06-18 MED ORDER — OSMOLITE 1.2 CAL PO LIQD
1000.0000 mL | ORAL | Status: DC
Start: 2021-06-18 — End: 2021-06-24
  Administered 2021-06-18 – 2021-06-21 (×4): 1000 mL
  Filled 2021-06-18 (×10): qty 1000

## 2021-06-18 MED ORDER — CHLORHEXIDINE GLUCONATE 0.12 % MT SOLN
OROMUCOSAL | Status: AC
  Administered 2021-06-17: 15 mL via OROMUCOSAL
  Filled 2021-06-18: qty 15

## 2021-06-18 MED ORDER — OXYCODONE HCL 5 MG PO TABS
5.0000 mg | ORAL_TABLET | Freq: Four times a day (QID) | ORAL | Status: DC
  Administered 2021-06-18 – 2021-06-19 (×4): 5 mg
  Filled 2021-06-18 (×4): qty 1

## 2021-06-18 NOTE — Progress Notes (Signed)
ABG results PH 7.38 PCO2 63 PO2 105 HCO3 37.7 SO2 98%

## 2021-06-18 NOTE — Progress Notes (Signed)
56 y/o F with severe COPD, got Vecuronium earlier to eliminate expiratory effort. Plat 20, DP 10 at that time. Varying PEEP levels from 8-12.  Auto-PEEP ~2 when measured earlier today. E-link called for ABG showing higher Pco2- RR is up to 28. Waveform showing slight autoPEEP, also using more resp effort.   Plans: - Con't steroids, albuterol -day team trying to minimize NMB use.  - will place one time order for use if needed, will not use it yet.  - attempt to briefly disconnect from vent, allow trapped gas exhalation - try lower RR if tolerated - since PP has been low, we can liberalize TV to 450-500 range, while monitoring Plateau pressures, or switch to PCV  - external PEEP increase if above fails -apparently f/u ABG shows co2 in the 60's, but we are not able to see this in epic.

## 2021-06-18 NOTE — Progress Notes (Signed)
NAME:  Kathleen Cameron, MRN:  [id number], DOB:  19-Jun-1965, LOS: 8 ADMISSION DATE:  06/11/2021, CONSULTATION DATE:  06/10/2021 REFERRING MD:  Ralene Bathe - EDP CHIEF COMPLAINT: Chest pain  History of Present Illness:  56 year old woman who presented to [facility] ED 2/19 with chest pain. PMHx significant for HTN, severe COPD (followed by Dr. Lamonte Sakai), chronic respiratory failure with tracheostomy in place, anxiety.  Prior to arrival, patient recently had change in ventilation from TV 480cc down to TV of 400cc. She has presented to the ED 3 times in the past 24 hours.  Discharged from the ED at 1500 after presenting with abdominal pain.  CT Chest/A/P negative for acute abnormality within the chest, abdomen, or pelvis. Presented again at 2200 with chest pain.  ED course notable for stable troponin (18 > 14), slightly elevated D-dimer, CTA Chest negative for PE but with concern for acute airway narrowing and opacification at the left mainstem bronchus, left lower lobar bronchus and lower subsegmental lower lobe airways.   PCCM was consulted for admission.    Pertinent Medical History:  Severe COPD, Chronic respiratory failure with tracheostomy in place, hypertension, anxiety  Significant Hospital Events: Including procedures, antibiotic start and stop dates in addition to other pertinent events   2/19-20: presented to ED x3 times. Abd pain: CT chest/abd/pelvis negative for acute process. CT chest: concern for L airway narrowing.  2/21 restless and anxious on vent overnight, received one dose paralytic and fentanyl.  Bronch demonstrating dynamic collapse of bilateral mainstem. 2/22 Improved vent mechanics, remains on Propofol/Fentanyl; Cortrak placement for nutritional support. 2/23 Vent dyssynchrony/breath stacking with increased pressures on vent early AM. Resolved with additional dose roc, sedation adjustment. 2/24 Ongoing vent dyssynchrony throughout the last 24 hours requiring 6 PRN doses of roc.  2/25  Patient on minimal vent settings and clinical improvement with pneumonia. Attempted to wean off sedation however had increased peak pressures due to EDAC and bronchospasm. Required vec 4PM and 7PM.  Interim History / Subjective:  Tmax 99 +3.5 L admission, 2.5 L out urine, -1.4 L past 24 hours  Received paralytic overnight due to vent dyssync  Fentanyl 91mcg, precedex 0.3  Unable to obtain subjective evaluation due to patient status  Objective:  Blood pressure (!) 102/53, pulse 71, temperature 97.9 F (36.6 C), resp. rate (!) 22, height 5\' 5"  (1.651 m), weight 58.8 kg, SpO2 100 %.    Vent Mode: PCV FiO2 (%):  [40 %-50 %] 40 % Set Rate:  [18 bmp-28 bmp] 22 bmp Vt Set:  [440 mL-500 mL] 500 mL PEEP:  [12 cmH20] 12 cmH20 Plateau Pressure:  [21 cmH20-33 cmH20] 28 cmH20   Intake/Output Summary (Last 24 hours) at 06/18/2021 1145 Last data filed at 06/18/2021 0900 Gross per 24 hour  Intake 937.05 ml  Output 2205 ml  Net -1267.95 ml   Filed Weights   06/16/21 0500 06/17/21 0445 06/18/21 0600  Weight: 65.2 kg 61.6 kg 58.8 kg   Physical Exam: General: In bed, NAD, appears comfortable HEENT: MM pink/moist, anicteric, atraumatic Neuro: RASS -3, PERRL 53mm, sedated CV: S1S2, NSR, no m/r/g appreciated PULM:  air movement in all lobes, trachea midline, chest expansion symmetric GI: soft, bsx4 active, non-tender   Extremities: warm/dry, no pretibial edema, capillary refill less than 3 seconds  Skin: Old peg site to abdomen, no rashes or lesions noted  Pertinent labs: CO2 34 BUN 44, Creatinine 1.22 WBC 6.6 hemoglobin 10.2> 9.3 Mag 2.9, phosphorus 5.2 7.38/63/105/37.7 ABG film with tube around GE junction  ABG 7.38/63/105/37.7   Resolved Hospital Problem List:  Abdominal pain - CT A/P negative for acute process.  History of PEG tube.  Lipase normal. Chest pain Hyperkalemia  Septic shock vs sedation related hypotension  Assessment & Plan:  Agitation requiring  sedation Anxiety Plan -PAD protocol. Hold precedex. Resume if agitated. Continue fentanyl gtt. -Started scheduled oxycodone to reduce fentanyl requirement. Patient should not be discharged on oxycodone.   -Continue klonopin 1 mg TID  Acute-on-chronic hypercarbic respiratory failure 2/2 Pseudomonas and Proteus PNA COPD exacerbation End-stage COPD with chronic ventilator dependence Tracheostomy status Baseline end-stage COPD (followed by Dr. Lamonte Sakai); trach- and vent-dependent PTA. Home vent settings: TV 400cc, RR 16, PEEP 5, O2 2-6L. Husband states that symptoms initially noted on with vent changes made 2/17 [facility]). Of note, home ventilator power cord was lost during transport. Home vent out of battery. EMS contacted to search for cord. L mainstem narrowing and opacification on CT Chest. Bedside bronch demonstrated bilateral dynamic collapse of both mainstem.  Extended RVP negative. Plan -Continue full vent support. Was on home ventilator on VC. Tolerating PC. Has history of mucous plugging. Will attempt to switch back to VC on 3.1 -PEEP to remain at 12. Do not wean due to risk of airway collapse. -Continue BD's:Pulmicort, Garlon Hatchet, Yupelri, PRN albuterol -Continue solu-medrol 125mg  BID -VAP bundle -Aggressive pulmonary hygiene -Continue cefepime (last dose 3.1) -Holding on further paralysis.  - Plan for tracheostomy exchange prior to discharge once patient more stable (followed by [name], NP) -No ABG is to be drawn unless order placed by physician. Notify PCCM if patient unstable or issues with ventilation.   History of hypertension Off levophed - Goal MAP > 65  - Continue holding home amlodipine. May resume on 3/1  PEG tube site wound, POA Sacral wound - WOC consulted, appreciate assistance -Continue Local wound care  Hypothyroidism -Continue synthroid  Chronic, normocytic anemia No bleeding noted, query nutrition-related vs. ACD. -Transfuse PRBC if HBG less than  7 -Obtain AM CBC to trend H&H -Monitor for signs of bleeding  Inadequate oral intake related to acute illness At risk for malnutrition PEG removed PTA. Previously eating well on vent, now with decreased PO intake/inability to take PO due to critical illness/increased vent requirements. NGT in place -continue tube feeding -monitor for signs of physiologic intolerance   Best Practice (right click and "Reselect all SmartList Selections" daily)   Diet/type: tubefeeds DVT prophylaxis: prophylactic heparin  GI prophylaxis: PPI Lines: N/A >> ?if we can d/c today  Foley:  Yes, and it is still needed Code Status:  full code Last date of multidisciplinary goals of care discussion 2/21. Husband updated at bedside 2/28.  Critical care time: 37 minutes   Redmond School., MSN, APRN, AGACNP-BC [place] [facility]  06/18/2021 , 11:45 AM  Please see [email or web address] for pager details  If no response, please call [phone] After hours, please call Elink at [phone]

## 2021-06-18 NOTE — Progress Notes (Signed)
*  late note* ABG drawn and shows pH 7.22 and CO2 in 90s range. RR increased to 28. Follow up ABG drawn at 0446 shows pH 7.34 CO2 in 60s range. RT adjusted vent settings to VT 500 and RR of 22 per MD. Istat blood gases have not been able to cross over. RT will cont to monitor.

## 2021-06-18 NOTE — Progress Notes (Signed)
Nutrition Follow-up  DOCUMENTATION CODES:   Not applicable  INTERVENTION:   NG tube advanced 10 cm by RN, TF resumed at trickle  Plan for Cortrak tomorrow. If TF issues persist, pt may benefit from post pyloric tube  Tube Feeding via NG vs Cortrak:  Osmolite 1.2 at 60 ml/hr Begin at 20 ml/hr; titrate by 10 mL q 8 hours until goal rate of 60 ml/hr This provides 80 g of protein, 1728 kcals and 1166 mL of free water    NUTRITION DIAGNOSIS:   Inadequate oral intake related to acute illness as evidenced by NPO status.  Being addressed via TF  GOAL:   Patient will meet greater than or equal to 90% of their needs  Progressing  MONITOR:   Diet advancement, Labs, Weight trends  REASON FOR ASSESSMENT:   Ventilator    ASSESSMENT:   56 yo female admitted with acute on chronic respiratory failure, vent/trach at baseline. PMH includes severe COPD, chronic respiratory failure-vent/trach at home, anxiety, HTN  2/20 Admit 2/21 Bronch 2/22 Cortrak placed, TF initiated  Pt remains on vent support via trach. Not currently requiring paralytic  KUB with no ileus on 2/24. Noted TF on hold for gastric secretions (?) over the weekend   At some point Cortrak was replaced by NG tube  Trickle TF restarted yesterday; this AM TF appearing liquid in suction cannister.   Repeat abd xray today indicating NG needing advancement by 10 cm. Spoke with RN who reports tube has been advanced and plan to resume trickle TF   Current wt 58.8 kg; admit weight around 56 kg. Net +3.7 L  Labs: reviewed Meds: lasix, colace, miralax, solumedrol  Diet Order:   Diet Order             Diet NPO time specified  Diet effective midnight                   EDUCATION NEEDS:   Not appropriate for education at this time  Skin:  Skin Assessment: Reviewed RN Assessment (no pressure injuries noted)  Last BM:  2/27 type 6  Height:   Ht Readings from Last 1 Encounters:  06/16/21 5\' 5"  (1.651 m)     Weight:   Wt Readings from Last 1 Encounters:  06/18/21 58.8 kg    Ideal Body Weight:     BMI:  Body mass index is 21.57 kg/m.  Estimated Nutritional Needs:   Kcal:  1600-1800 kcals  Protein:  80-90 g  Fluid:  >/= 1.6 L   06/20/21 MS, RDN, LDN, CNSC Registered Dietitian III Clinical Nutrition RD Pager and On-Call Pager Number Located in [place]

## 2021-06-19 ENCOUNTER — Inpatient Hospital Stay (HOSPITAL_COMMUNITY): Payer: Medicare Other

## 2021-06-19 DIAGNOSIS — J441 Chronic obstructive pulmonary disease with (acute) exacerbation: Secondary | ICD-10-CM

## 2021-06-19 DIAGNOSIS — J9621 Acute and chronic respiratory failure with hypoxia: Secondary | ICD-10-CM | POA: Diagnosis not present

## 2021-06-19 DIAGNOSIS — J9611 Chronic respiratory failure with hypoxia: Secondary | ICD-10-CM | POA: Diagnosis not present

## 2021-06-19 DIAGNOSIS — Z9911 Dependence on respirator [ventilator] status: Secondary | ICD-10-CM | POA: Diagnosis not present

## 2021-06-19 LAB — CBC
HCT: 30.8 % — ABNORMAL LOW (ref 36.0–46.0)
Hemoglobin: 9.5 g/dL — ABNORMAL LOW (ref 12.0–15.0)
MCH: 27.3 pg (ref 26.0–34.0)
MCHC: 30.8 g/dL (ref 30.0–36.0)
MCV: 88.5 fL (ref 80.0–100.0)
Platelets: 205 10*3/uL (ref 150–400)
RBC: 3.48 MIL/uL — ABNORMAL LOW (ref 3.87–5.11)
RDW: 16.9 % — ABNORMAL HIGH (ref 11.5–15.5)
WBC: 5.4 10*3/uL (ref 4.0–10.5)
nRBC: 0 % (ref 0.0–0.2)

## 2021-06-19 LAB — GLUCOSE, CAPILLARY
Glucose-Capillary: 112 mg/dL — ABNORMAL HIGH (ref 70–99)
Glucose-Capillary: 147 mg/dL — ABNORMAL HIGH (ref 70–99)
Glucose-Capillary: 155 mg/dL — ABNORMAL HIGH (ref 70–99)
Glucose-Capillary: 174 mg/dL — ABNORMAL HIGH (ref 70–99)
Glucose-Capillary: 176 mg/dL — ABNORMAL HIGH (ref 70–99)
Glucose-Capillary: 176 mg/dL — ABNORMAL HIGH (ref 70–99)
Glucose-Capillary: 178 mg/dL — ABNORMAL HIGH (ref 70–99)

## 2021-06-19 LAB — BASIC METABOLIC PANEL
Anion gap: 9 (ref 5–15)
BUN: 53 mg/dL — ABNORMAL HIGH (ref 6–20)
CO2: 35 mmol/L — ABNORMAL HIGH (ref 22–32)
Calcium: 9.4 mg/dL (ref 8.9–10.3)
Chloride: 96 mmol/L — ABNORMAL LOW (ref 98–111)
Creatinine, Ser: 1.2 mg/dL — ABNORMAL HIGH (ref 0.44–1.00)
GFR, Estimated: 53 mL/min — ABNORMAL LOW (ref >60)
Glucose, Bld: 191 mg/dL — ABNORMAL HIGH (ref 70–99)
Potassium: 3.8 mmol/L (ref 3.5–5.1)
Sodium: 140 mmol/L (ref 135–145)

## 2021-06-19 LAB — PHOSPHORUS: Phosphorus: 3.6 mg/dL (ref 2.5–4.6)

## 2021-06-19 LAB — MAGNESIUM: Magnesium: 2.6 mg/dL — ABNORMAL HIGH (ref 1.7–2.4)

## 2021-06-19 MED ORDER — SODIUM CHLORIDE 0.9 % IV SOLN: INTRAVENOUS | Status: DC | PRN

## 2021-06-19 MED ORDER — OXYCODONE HCL 5 MG PO TABS
5.0000 mg | ORAL_TABLET | ORAL | Status: DC
  Administered 2021-06-19 – 2021-06-24 (×27): 5 mg
  Filled 2021-06-19 (×29): qty 1

## 2021-06-19 MED ORDER — GERHARDT'S BUTT CREAM
TOPICAL_CREAM | CUTANEOUS | Status: DC | PRN
  Administered 2021-06-20 – 2021-06-24 (×3): 1 via TOPICAL
  Filled 2021-06-19: qty 1

## 2021-06-19 MED ORDER — METOCLOPRAMIDE HCL 5 MG/ML IJ SOLN: 5.0000 mg | Freq: Once | INTRAMUSCULAR | Status: DC

## 2021-06-19 MED ORDER — ALBUTEROL SULFATE (2.5 MG/3ML) 0.083% IN NEBU
2.5000 mg | INHALATION_SOLUTION | RESPIRATORY_TRACT | Status: DC
  Administered 2021-06-19 – 2021-06-20 (×8): 2.5 mg via RESPIRATORY_TRACT
  Filled 2021-06-19 (×8): qty 3

## 2021-06-19 MED ORDER — FUROSEMIDE 10 MG/ML IJ SOLN
60.0000 mg | Freq: Two times a day (BID) | INTRAMUSCULAR | Status: DC
Start: 2021-06-19 — End: 2021-06-21
  Administered 2021-06-19 – 2021-06-21 (×3): 60 mg via INTRAVENOUS
  Filled 2021-06-19 (×4): qty 6

## 2021-06-19 MED ORDER — ALBUTEROL SULFATE (2.5 MG/3ML) 0.083% IN NEBU: 10.0000 mg/h | INHALATION_SOLUTION | RESPIRATORY_TRACT | Status: DC

## 2021-06-19 MED ORDER — PROPOFOL 1000 MG/100ML IV EMUL
5.0000 ug/kg/min | INTRAVENOUS | Status: DC
  Administered 2021-06-19 – 2021-06-20 (×5): 55 ug/kg/min via INTRAVENOUS
  Administered 2021-06-20: 10:00:00 45 ug/kg/min via INTRAVENOUS
  Administered 2021-06-20: 19:00:00 55 ug/kg/min via INTRAVENOUS
  Administered 2021-06-21 (×3): 60 ug/kg/min via INTRAVENOUS
  Administered 2021-06-22 – 2021-06-24 (×6): 50 ug/kg/min via INTRAVENOUS
  Administered 2021-06-24: 60.057 ug/kg/min via INTRAVENOUS
  Filled 2021-06-19 (×17): qty 100
  Filled 2021-06-19: qty 200
  Filled 2021-06-19 (×4): qty 100

## 2021-06-19 MED ORDER — ALBUTEROL (5 MG/ML) CONTINUOUS INHALATION SOLN
10.0000 mg/h | INHALATION_SOLUTION | RESPIRATORY_TRACT | Status: DC
  Administered 2021-06-19: 10 mg/h via RESPIRATORY_TRACT
  Filled 2021-06-19 (×2): qty 8

## 2021-06-19 MED ORDER — METOCLOPRAMIDE HCL 5 MG/ML IJ SOLN: 5.0000 mg | Freq: Once | INTRAMUSCULAR | Status: DC | PRN

## 2021-06-19 MED ORDER — PROPOFOL 1000 MG/100ML IV EMUL
INTRAVENOUS | Status: AC
  Administered 2021-06-19: 20 ug/kg/min via INTRAVENOUS
  Filled 2021-06-19: qty 100

## 2021-06-19 MED ORDER — INSULIN ASPART 100 UNIT/ML IJ SOLN
2.0000 [IU] | INTRAMUSCULAR | Status: DC
  Administered 2021-06-19 – 2021-06-20 (×5): 4 [IU] via SUBCUTANEOUS
  Administered 2021-06-20 (×3): 2 [IU] via SUBCUTANEOUS
  Administered 2021-06-21 (×3): 4 [IU] via SUBCUTANEOUS
  Administered 2021-06-21: 2 [IU] via SUBCUTANEOUS

## 2021-06-19 MED ORDER — METHYLPREDNISOLONE SODIUM SUCC 125 MG IJ SOLR
125.0000 mg | Freq: Four times a day (QID) | INTRAMUSCULAR | Status: DC
  Administered 2021-06-19 – 2021-06-21 (×9): 125 mg via INTRAVENOUS
  Filled 2021-06-19 (×8): qty 2

## 2021-06-19 MED ORDER — ALBUTEROL (5 MG/ML) CONTINUOUS INHALATION SOLN
10.0000 mg/h | INHALATION_SOLUTION | RESPIRATORY_TRACT | Status: DC
  Filled 2021-06-19: qty 0.5

## 2021-06-19 NOTE — Progress Notes (Addendum)
Pharmacy Antibiotic Note ? ?Kathleen Cameron is a 56 y.o. female admitted on 06/02/2021 with pneumonia.  Pharmacy has been consulted for cefepime dosing. ? ?WBC 5.4, afebrile, on steroids. Plan for 14 days of cefepime given respiratory cultures growing abundant pseudomonas and proteus.  ? ?Plan: ?Cefepime 2g IV q 12 hrs. ?Monitor renal fx, cx results, clinical pic ? ?Height: _0  (165.1 cm) ?Weight: 58 kg (127 lb 13.9 oz) ?IBW/kg (Calculated) : 57 ? ?Temp (24hrs), Avg:98.3 ?F (36.8 ?C), Min:98.2 ?F (36.8 ?C), Max:98.4 ?F (36.9 ?C) ? ?Recent Labs  ?Lab 06/13/21 ?1032 06/13/21 ?1632 06/14/21 ?1240 06/14/21 ?1559 06/15/21 ?0207 06/15/21 ?1600 06/16/21 ?0205 06/16/21 ?1194 06/17/21 ?1740 06/18/21 ?0421 06/19/21 ?0337  ?WBC  --    < >  --   --  6.4  --  8.0  --  6.6 6.6 5.4  ?CREATININE  --    < >  --  1.28* 1.40* 1.37*  --  1.14* 1.07* 1.22* 1.20*  ?LATICACIDVEN 0.8  --  0.6 0.8  --   --   --   --   --   --   --   ? < > = values in this interval not displayed.  ? ?  ?Estimated Creatinine Clearance: 46.5 mL/min (A) (by C-G formula based on SCr of 1.2 mg/dL (H)).   ? ?Allergies  ?Allergen Reactions  ? Stiolto Respimat [Tiotropium Bromide-Olodaterol] Other (See Comments)  ?  Severe headaches and rapid heartrate  ? ? ?Antimicrobials this admission: ?Azithro 2/20 > 2/21 ?Doxy 2/20 > 2/23  ?Ceftriaxone 2/21 > 2/23 ?Cefepime 2/23 >  ? ?Dose adjustments this admission: ?N/A ? ?Microbiology results: ?2/21 BCx > negF ?2/21 Resp virus panel - neg ?2/21 - BAL - abundant proteus mirabilis, abundant pseudomonas ? ?Thank you for allowing pharmacy to be a part of this patient?s care. ? ?Antonietta Jewel, PharmD, BCCCP ?Clinical Pharmacist  ?Phone: [phone] ?06/19/2021 11:37 AM ? ?Please check AMION for all [place] phone numbers ?After 10:00 PM, call [place] [phone] ? ?

## 2021-06-19 NOTE — Progress Notes (Addendum)
? ? ? ? ?NAME:  Kathleen Cameron, MRN:  [id number], DOB:  [birth year], LOS: 9 ?ADMISSION DATE:  06/04/2021, CONSULTATION DATE:  06/10/2021 ?REFERRING MD:  Ralene Bathe - EDP CHIEF COMPLAINT: Chest pain ? ?History of Present Illness:  ?56 year old woman who presented to [facility] ED 2/19 with chest pain. PMHx significant for HTN, severe COPD (followed by Dr. Lamonte Sakai), chronic respiratory failure with tracheostomy in place, anxiety. ? ?Prior to arrival, patient recently had change in ventilation from TV 480cc down to TV of 400cc. She has presented to the ED 3 times in the past 24 hours.  Discharged from the ED at 1500 after presenting with abdominal pain.  CT Chest/A/P negative for acute abnormality within the chest, abdomen, or pelvis. Presented again at 2200 with chest pain. ? ?ED course notable for stable troponin (18 > 14), slightly elevated D-dimer, CTA Chest negative for PE but with concern for acute airway narrowing and opacification at the left mainstem bronchus, left lower lobar bronchus and lower subsegmental lower lobe airways.  ? ?PCCM was consulted for admission.   ? ?Pertinent Medical History:  ?Severe COPD, Chronic respiratory failure with tracheostomy in place, hypertension, anxiety ? ?Significant Hospital Events: ?Including procedures, antibiotic start and stop dates in addition to other pertinent events   ?2/19-20: presented to ED x3 times. Abd pain: CT chest/abd/pelvis negative for acute process. CT chest: concern for L airway narrowing.  ?2/21 restless and anxious on vent overnight, received one dose paralytic and fentanyl.  Bronch demonstrating dynamic collapse of bilateral mainstem. ?2/22 Improved vent mechanics, remains on Propofol/Fentanyl; Cortrak placement for nutritional support. ?2/23 Vent dyssynchrony/breath stacking with increased pressures on vent early AM. Resolved with additional dose roc, sedation adjustment. ?2/24 Ongoing vent dyssynchrony throughout the last 24 hours requiring 6 PRN doses of roc.   ?2/25 Patient on minimal vent settings and clinical improvement with pneumonia. Attempted to wean off sedation however had increased peak pressures due to EDAC and bronchospasm. Required vec 4PM and 7PM. ?2/28 AM required vec x2 ?3/1 switched from precedex to propofol ? ?Interim History / Subjective:  ?Tmax 98.4 ?+3.6 L admission, -40 mL yesterday, 1.3 L urine, 590 stool ? ?No acute events overnight, did not require paralytic ? ?Propofol 10, 225 fentanyl ? ?Unable to obtain subjective evaluation due to patient status ? ?Objective:  ?Blood pressure (!) 133/58, pulse (!) 46, temperature 98.4 ?F (36.9 ?C), resp. rate (!) 22, height 5\' 5"  (1.651 m), weight 58 kg, SpO2 100 %. ?   ?Vent Mode: PRVC ?FiO2 (%):  [40 %] 40 % ?Set Rate:  [22 bmp] 22 bmp ?Vt Set:  [450 mL] 450 mL ?PEEP:  [12 cmH20] 12 cmH20 ?Plateau Pressure:  [24 cmH20-31 cmH20] 31 cmH20  ? ?Intake/Output Summary (Last 24 hours) at 06/19/2021 0859 ?Last data filed at 06/19/2021 0800 ?Gross per 24 hour  ?Intake 2212.19 ml  ?Output 1985 ml  ?Net 227.19 ml  ? ?Filed Weights  ? 06/17/21 0445 06/18/21 0600 06/19/21 0351  ?Weight: 61.6 kg 58.8 kg 58 kg  ? ?General: In bed, frail, thin, ill appearing ?HEENT: MM pink/moist, anicteric, atraumatic ?Neuro:PERRL 51mm, sedated on vent ?CV: S1S2, SB, no m/r/g appreciated ?PULM:  airflow appreciated in all lobes, trachea midline, chest expansion symmetric ?GI: soft, bsx4 active, non-tender   ?Extremities: warm/dry, no pretibial edema, capillary refill less than 3 seconds  ?Skin: old peg site, no rashes or lesions noted ? ?Pertinent labs: ?CO2 35 ?Creatinine 1.22> 1.2, BUN 44>53 ?Hemoglobin 9.3> 9.5 ?Blood glucose 141-191 ? ?[facility]  Problem List:  ?Abdominal pain - CT A/P negative for acute process.  History of PEG tube.  Lipase normal. ?Chest pain ?Hyperkalemia  ?Septic shock vs sedation related hypotension ? 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 Lamonte Sakai); trach- and vent-dependent PTA. Home vent settings: TV 400cc, RR 16, PEEP 5, O2 2-6L. Husband states that symptoms initially noted on with vent changes made 2/17 [facility]). Of note, home ventilator power cord was lost during transport. Home vent out of battery. EMS contacted to search for cord. L mainstem narrowing and opacification on CT Chest. Bedside bronch demonstrated bilateral dynamic collapse of both mainstem.  Extended RVP negative. ?Plan ?-Continue full vent support. LTVV, 4-8cc/kg, Goal to have Pplat below 30, however unable to keep plat below 30. Required vec x2 on 2/28 due to dyssynchrony and required increased in sedation ?-PEEP to remain at 12. Do not wean due to risk of airway collapse. ?-Continue solu-medrol 125mg  BID. Increased to q6h.  ?-VAP bundle ?-Last day of cefepime. Monitor fever/wbc curve ?-Aggressive pulmonary toilet ?-Continue BD's:Pulmicort, Brovana, Yupelri, continious nebs, PRN albuterol ?-Diuresis with furosemide 40 BID. ?- Plan for tracheostomy exchange prior to discharge once patient more stable (followed by [name], NP) ?-No ABG is to be drawn unless order placed by physician. Notify PCCM if patient unstable or issues with ventilation. ?-Continue [place] conversations with husband. Patient with end stage COPD. May not be able to previous vent strategy.  ? ?Acute metabolic encephalopathy, multifactorial, sedation, prolonged ICU stay   ?Agitation requiring sedation ?Anxiety ?Patient continues to fail at weaning with dyssynchrony and elevated BP, peak pressures, plat pressures. ?Plan ?-Continue PAD protocol. Will transitioned from precedex to propofol gtt with fentanyl GTT. Wean fentanyl, increase propofol. ?-Continue scheduled oxy 5mg  Q6H>q4h, to reduce fentanyl requirement. Patient should not be discharged on oxycodone.   ?-Continue klonopin 1 mg TID   ? ?History of hypertension ?Remains off levophed. Starting propofol today ?-Goal MAP greater than 65 ?-Will consider resuming home amlodipine based on toleration of propofol ? ?PEG tube site wound, POA ?Sacral wound ?-Continue local wound care per WOC. ? ?Hypothyroidism ?-Continue synthroid ? ?Chronic, normocytic anemia ?No bleeding noted, query nutrition-related vs. ACD. Hemoglobin 9.3> 9.5. ?-Transfuse PRBC if HBG less than 7 ?-Obtain AM CBC to trend H&H ?-Monitor for signs of bleeding ? ?Inadequate oral intake related to acute illness ?At risk for malnutrition ?PEG removed PTA. Previously eating well on vent, now with decreased PO intake/inability to take PO due to critical illness/increased vent requirements. NGT in place ?-Continue tube feeding ?-Cortrak to be placed today ?-Monitor for signs of physiologic intolerance  ? ?Best Practice (right click and "Reselect all SmartList Selections" daily)  ? ?Diet/type: tubefeeds ?DVT prophylaxis: prophylactic heparin  ?GI prophylaxis: N/A, on TF will stop ?Lines: Central line and Arterial Line, evaluate for DC in afternoon ?Foley:  Yes, and it is still needed, duiresing ?Code Status:  full code ?Last date of multidisciplinary goals of care discussion 2/28 with Dr. Carlis Abbott ? ?Critical care time: 36 minutes  ? ?Redmond School., MSN, APRN, AGACNP-BC ?[facility]  ?06/19/2021 , 8:59 AM ? ?Please see [email or web address] for pager details ? ?If no response, please call [phone] ?After hours, please call Elink at [phone] ? ? ? ?

## 2021-06-19 NOTE — Procedures (Signed)
Cortrak ? ?Tube Type:  Cortrak - 43 inches ?Tube Location:  Right nare ?Initial Placement:  Stomach ?Secured by:  [place] ?Technique Used to Measure Tube Placement:  Marking at nare/corner of mouth ?Cortrak Secured At:  63 cm ? ?Cortrak Tube Team Note: ? ?Consult received to place a Cortrak feeding tube.  ? ?X-ray is required, abdominal x-ray has been ordered by the Cortrak team. Please confirm tube placement before using the Cortrak tube.  ? ?If the tube becomes dislodged please keep the tube and contact the Cortrak team at [email or web address] (password [email or web address]) for replacement.  ?If after hours and replacement cannot be delayed, place a NG tube and confirm placement with an abdominal x-ray.  ? ? ?Betsey Holiday MS, RD, LDN ?Please refer to [facility] for RD and/or RD on-call/weekend/after hours pager ? ? ?

## 2021-06-19 DEATH — deceased

## 2021-06-20 DIAGNOSIS — N179 Acute kidney failure, unspecified: Secondary | ICD-10-CM | POA: Diagnosis not present

## 2021-06-20 DIAGNOSIS — J9611 Chronic respiratory failure with hypoxia: Secondary | ICD-10-CM | POA: Diagnosis not present

## 2021-06-20 DIAGNOSIS — J9622 Acute and chronic respiratory failure with hypercapnia: Secondary | ICD-10-CM | POA: Diagnosis not present

## 2021-06-20 DIAGNOSIS — Z930 Tracheostomy status: Secondary | ICD-10-CM | POA: Diagnosis not present

## 2021-06-20 DIAGNOSIS — J151 Pneumonia due to Pseudomonas: Secondary | ICD-10-CM

## 2021-06-20 LAB — POCT I-STAT 7, (LYTES, BLD GAS, ICA,H+H)
Acid-Base Excess: 10 mmol/L — ABNORMAL HIGH (ref 0.0–2.0)
Acid-Base Excess: 10 mmol/L — ABNORMAL HIGH (ref 0.0–2.0)
Acid-Base Excess: 11 mmol/L — ABNORMAL HIGH (ref 0.0–2.0)
Bicarbonate: 40.1 mmol/L — ABNORMAL HIGH (ref 20.0–28.0)
Bicarbonate: 37.9 mmol/L — ABNORMAL HIGH (ref 20.0–28.0)
Bicarbonate: 37.7 mmol/L — ABNORMAL HIGH (ref 20.0–28.0)
Calcium, Ion: 1.32 mmol/L (ref 1.15–1.40)
Calcium, Ion: 1.28 mmol/L (ref 1.15–1.40)
Calcium, Ion: 1.27 mmol/L (ref 1.15–1.40)
HCT: 31 % — ABNORMAL LOW (ref 36.0–46.0)
HCT: 29 % — ABNORMAL LOW (ref 36.0–46.0)
HCT: 31 % — ABNORMAL LOW (ref 36.0–46.0)
Hemoglobin: 10.5 g/dL — ABNORMAL LOW (ref 12.0–15.0)
Hemoglobin: 9.9 g/dL — ABNORMAL LOW (ref 12.0–15.0)
Hemoglobin: 10.5 g/dL — ABNORMAL LOW (ref 12.0–15.0)
O2 Saturation: 99 %
O2 Saturation: 99 %
O2 Saturation: 98 %
Patient temperature: 37.1
Patient temperature: 98
Patient temperature: 36.6
Potassium: 4.7 mmol/L (ref 3.5–5.1)
Potassium: 4.5 mmol/L (ref 3.5–5.1)
Potassium: 4.2 mmol/L (ref 3.5–5.1)
Sodium: 140 mmol/L (ref 135–145)
Sodium: 140 mmol/L (ref 135–145)
Sodium: 141 mmol/L (ref 135–145)
TCO2: 43 mmol/L — ABNORMAL HIGH (ref 22–32)
TCO2: 40 mmol/L — ABNORMAL HIGH (ref 22–32)
TCO2: 40 mmol/L — ABNORMAL HIGH (ref 22–32)
pCO2 arterial: 97 mmHg (ref 32–48)
pCO2 arterial: 68.7 mmHg (ref 32–48)
pCO2 arterial: 63.4 mmHg — ABNORMAL HIGH (ref 32–48)
pH, Arterial: 7.225 — ABNORMAL LOW (ref 7.35–7.45)
pH, Arterial: 7.349 — ABNORMAL LOW (ref 7.35–7.45)
pH, Arterial: 7.382 (ref 7.35–7.45)
pO2, Arterial: 159 mmHg — ABNORMAL HIGH (ref 83–108)
pO2, Arterial: 130 mmHg — ABNORMAL HIGH (ref 83–108)
pO2, Arterial: 105 mmHg (ref 83–108)

## 2021-06-20 LAB — BASIC METABOLIC PANEL
Anion gap: 12 (ref 5–15)
BUN: 65 mg/dL — ABNORMAL HIGH (ref 6–20)
CO2: 32 mmol/L (ref 22–32)
Calcium: 9.1 mg/dL (ref 8.9–10.3)
Chloride: 96 mmol/L — ABNORMAL LOW (ref 98–111)
Creatinine, Ser: 1.81 mg/dL — ABNORMAL HIGH (ref 0.44–1.00)
GFR, Estimated: 32 mL/min — ABNORMAL LOW (ref >60)
Glucose, Bld: 191 mg/dL — ABNORMAL HIGH (ref 70–99)
Potassium: 3.5 mmol/L (ref 3.5–5.1)
Sodium: 140 mmol/L (ref 135–145)

## 2021-06-20 LAB — MAGNESIUM: Magnesium: 2.7 mg/dL — ABNORMAL HIGH (ref 1.7–2.4)

## 2021-06-20 LAB — CBC
HCT: 28.5 % — ABNORMAL LOW (ref 36.0–46.0)
Hemoglobin: 8.7 g/dL — ABNORMAL LOW (ref 12.0–15.0)
MCH: 27.8 pg (ref 26.0–34.0)
MCHC: 30.5 g/dL (ref 30.0–36.0)
MCV: 91.1 fL (ref 80.0–100.0)
Platelets: 198 10*3/uL (ref 150–400)
RBC: 3.13 MIL/uL — ABNORMAL LOW (ref 3.87–5.11)
RDW: 17.2 % — ABNORMAL HIGH (ref 11.5–15.5)
WBC: 6.8 10*3/uL (ref 4.0–10.5)
nRBC: 0 % (ref 0.0–0.2)

## 2021-06-20 LAB — GLUCOSE, CAPILLARY
Glucose-Capillary: 186 mg/dL — ABNORMAL HIGH (ref 70–99)
Glucose-Capillary: 191 mg/dL — ABNORMAL HIGH (ref 70–99)
Glucose-Capillary: 134 mg/dL — ABNORMAL HIGH (ref 70–99)
Glucose-Capillary: 136 mg/dL — ABNORMAL HIGH (ref 70–99)
Glucose-Capillary: 148 mg/dL — ABNORMAL HIGH (ref 70–99)

## 2021-06-20 LAB — PHOSPHORUS: Phosphorus: 3.2 mg/dL (ref 2.5–4.6)

## 2021-06-20 LAB — TRIGLYCERIDES: Triglycerides: 98 mg/dL (ref <150)

## 2021-06-20 MED ORDER — POTASSIUM CHLORIDE 20 MEQ PO PACK
20.0000 meq | PACK | Freq: Once | ORAL | Status: AC
  Administered 2021-06-20: 20 meq
  Filled 2021-06-20: qty 1

## 2021-06-20 MED ORDER — POTASSIUM CHLORIDE CRYS ER 20 MEQ PO TBCR
20.0000 meq | EXTENDED_RELEASE_TABLET | Freq: Once | ORAL | Status: DC
  Filled 2021-06-20: qty 1

## 2021-06-20 MED ORDER — INSULIN GLARGINE-YFGN 100 UNIT/ML ~~LOC~~ SOLN
8.0000 [IU] | Freq: Every day | SUBCUTANEOUS | Status: DC
  Administered 2021-06-20 – 2021-06-24 (×5): 8 [IU] via SUBCUTANEOUS
  Filled 2021-06-20 (×5): qty 0.08

## 2021-06-20 MED ORDER — ROCURONIUM BROMIDE 10 MG/ML (PF) SYRINGE
1.0000 mg/kg | PREFILLED_SYRINGE | Freq: Once | INTRAVENOUS | Status: AC
  Administered 2021-06-20: 61.6 mg via INTRAVENOUS
  Filled 2021-06-20: qty 10

## 2021-06-20 NOTE — Progress Notes (Signed)
? ? ? ? ?NAME:  May Chheng, MRN:  [id number], DOB:  January 22, 1966, LOS: 10 ?ADMISSION DATE:  06/03/2021, CONSULTATION DATE:  06/10/2021 ?REFERRING MD:  Ralene Bathe - EDP CHIEF COMPLAINT: Chest pain ? ?History of Present Illness:  ?56 year old woman who presented to [facility] ED 2/19 with chest pain. PMHx significant for HTN, severe COPD (followed by Dr. Lamonte Sakai), chronic respiratory failure with tracheostomy in place, anxiety. ? ?Prior to arrival, patient recently had change in ventilation from TV 480cc down to TV of 400cc. She has presented to the ED 3 times in the past 24 hours.  Discharged from the ED at 1500 after presenting with abdominal pain.  CT Chest/A/P negative for acute abnormality within the chest, abdomen, or pelvis. Presented again at 2200 with chest pain. ? ?ED course notable for stable troponin (18 > 14), slightly elevated D-dimer, CTA Chest negative for PE but with concern for acute airway narrowing and opacification at the left mainstem bronchus, left lower lobar bronchus and lower subsegmental lower lobe airways.  ? ?PCCM was consulted for admission.   ? ?Pertinent Medical History:  ?Severe COPD, Chronic respiratory failure with tracheostomy in place, hypertension, anxiety ? ?Significant Hospital Events: ?Including procedures, antibiotic start and stop dates in addition to other pertinent events   ?2/19-20: presented to ED x3 times. Abd pain: CT chest/abd/pelvis negative for acute process. CT chest: concern for L airway narrowing.  ?2/21 restless and anxious on vent overnight, received one dose paralytic and fentanyl.  Bronch demonstrating dynamic collapse of bilateral mainstem. ?2/22 Improved vent mechanics, remains on Propofol/Fentanyl; Cortrak placement for nutritional support. ?2/23 Vent dyssynchrony/breath stacking with increased pressures on vent early AM. Resolved with additional dose roc, sedation adjustment. ?2/24 Ongoing vent dyssynchrony throughout the last 24 hours requiring 6 PRN doses of roc.   ?2/25 Patient on minimal vent settings and clinical improvement with pneumonia. Attempted to wean off sedation however had increased peak pressures due to EDAC and bronchospasm. Required vec 4PM and 7PM. ?2/28 AM required vec x2 ?3/1 switched from precedex to propofol ? ?Interim History / Subjective:  ?Tmax 99 ?+5.8 L admission, 565 urine output, 10 mL documented stool ? ?Intubated/sedated ? ?No acute events overnight, no rocuronium required ? ?Propofol 55, fentanyl 150 ? ?Unable to obtain subjective evaluation due to patient status ? ?Objective:  ?Blood pressure 140/61, pulse 97, temperature 97.7 ?F (36.5 ?C), resp. rate 17, height 5\' 5"  (1.651 m), weight 61.6 kg, SpO2 98 %. ?   ?Vent Mode: PRVC ?FiO2 (%):  [40 %-50 %] 40 % ?Set Rate:  [22 bmp] 22 bmp ?Vt Set:  [470 mL-520 mL] 470 mL ?PEEP:  [12 cmH20-14 cmH20] 14 cmH20 ?Plateau Pressure:  [17 cmH20-39 cmH20] 26 cmH20  ? ?Intake/Output Summary (Last 24 hours) at 06/20/2021 [id number] ?Last data filed at 06/20/2021 0900 ?Gross per 24 hour  ?Intake 2443.26 ml  ?Output 500 ml  ?Net 1943.26 ml  ? ?Filed Weights  ? 06/18/21 0600 06/19/21 0351 06/20/21 0500  ?Weight: 58.8 kg 58 kg 61.6 kg  ? ?General: In bed, NAD, appears comfortable frail ?HEENT: MM pink/moist, anicteric, atraumatic ?Neuro: RASS -4, PERRL 58mm, sedated ?CV: S1S2, NSR, no m/r/g appreciated ?PULM: Air movement present in all Lobes, trachea midline, chest expansion symmetric ?GI: soft, bsx4 active, non-tender   ?Extremities: warm/dry,  generalized edema, capillary refill less than 3 seconds  ?Skin: Old PEG site C/D/I, no rashes or lesions noted ? ? ?Pertinent labs: ?Blood glucose 112-191 ?Creatinine 1.2> 181, BUN 53> 65 ?Potassium 3.5 ?Hemoglobin 9.5>  8.7 ?Triglycerides 98 ? ?Resolved Hospital Problem List:  ?Abdominal pain - CT A/P negative for acute process.  History of PEG tube.  Lipase normal. ?Chest pain ?Hyperkalemia  ?Septic shock vs sedation related hypotension ? 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 Lamonte Sakai); trach- and vent-dependent PTA. Home vent settings: TV 400cc, RR 16, PEEP 5, O2 2-6L. Husband states that symptoms initially noted on with vent changes made 2/17 [facility]). . L mainstem narrowing and opacification on CT Chest. Bedside bronch demonstrated bilateral dynamic collapse of both mainstem.  Extended RVP negative.  Continuous albuterol nebs on 3/1 did not result in improvement.  Peak 37, plateau pressures 28 this morning.  Patient compliance improved on 55 of propofol. ?Plan ?-New full vent support. LTVV, 4-8cc/kg, Goal to have Pplat below 30.  ?-PEEP to remain at 12. Do not wean due to risk of airway collapse. ?-Continue solu-medrol 125mg  BID. Increased to q6h.  ?-VAP bundle ?-Trach care per protocol ?-Continue cefepime.  Patient will need prolonged course due to Pseudomonas. ?-Aggressive pulmonary toilet ?-Continue BD's:Pulmicort, Brovana, Yupelri, PRN albuterol ?-Continue diuresis with furosemide 40 BID. ?- Plan for tracheostomy exchange prior to discharge once patient more stable (followed by [name], NP) ?-No ABG is to be drawn unless order placed by physician. Notify PCCM if patient unstable or issues with ventilation. ?-Continue [place] conversations with husband. Patient with end stage COPD.  Discussed at bedside with Dr. Carlis Abbott.  Lung functioning worsening after Pseudomonas pneumonia and continued bronchospasm. ongoing difficulty with ventilator requiring high doses of sedation.  Husband stated that he would gather the kids to have ongoing discussion on goals of care for patient. ? ?Acute metabolic encephalopathy, multifactorial, sedation, prolonged ICU stay   ?Agitation requiring sedation ?Anxiety ?Patient continues to fail at weaning with dyssynchrony and elevated BP, peak  pressures, plat pressures. ?Plan ?-Continue PAD protocol.  Continue propofol and fentanyl drips.  Goal RASS 0 to -1. ?-Continue scheduled oxy 5mg  q4h, to reduce fentanyl requirement. Patient should not be discharged on oxycodone.   ?-Continue klonopin 1 mg TID  ? ?AKI ?Creatinine 1.2> 181, BUN 53> 65, suspect secondary to sepsis and ongoing need for diuresis due to respiratory failure. ?-Ensure renal perfusion. Goal MAP 65 or greater. ?-Avoid neprotoxic drugs as possible. ?-Strict I&O's ?-Follow up AM creatinine ? ?History of hypertension ?Remains off Levophed.  Requiring high doses propofol. ?-Treat pain first, per orders ?- Continue holding home amlodipine ? ?PEG tube site wound, POA ?Sacral wound ?-Continue local wound care per WOC. ? ?Hypothyroidism ?-Continue synthroid ? ?Chronic, normocytic anemia ?No bleeding noted, query nutrition-related vs. ACD. Hemoglobin 9.5> 8.7 ?-Transfuse PRBC if HBG less than 7 ?-Obtain AM CBC to trend H&H ?-Monitor for signs of bleeding ? ?Hyperglycemia ?Blood glucose 112-191. On steroids ?-Blood Glucose goal 140-180. ?-SSI ? ?Hypokalemia ?Diuresing ?-Cautious repletion ? ?Inadequate oral intake related to acute illness ?At risk for malnutrition ?PEG removed PTA. Previously eating well on vent, now with decreased PO intake/inability to take PO due to critical illness/increased vent requirements.  Coretrack in place ?-Continue tube feeding ?-Monitor for signs of physiologic intolerance ? ? ?Best Practice (right click and "Reselect all SmartList Selections" daily)  ? ?Diet/type: tubefeeds ?DVT prophylaxis: prophylactic heparin  ?GI prophylaxis: PPI, ?Lines: Arterial Line and yes and it is still needed,  ?Foley:  Yes, and it is still needed,  duiresing ?Code Status:  full code ?Last date of multidisciplinary goals of care discussion 3/2  with Dr. Carlis Abbott at bedside ? ?Critical care time: 36 minutes  ? ?Redmond [employer]., MSN, APRN, AGACNP-BC ?[facility]   ?06/20/2021 , 9:52 AM ? ?Please see [email or web address] for pager details ? ?If no response, please call [phone] ?After hours, please call Elink at [phone] ? ? ? ?

## 2021-06-20 NOTE — Progress Notes (Signed)
eLink Physician-Brief Progress Note ?Patient Name: Kathleen Cameron ?DOB: [birth year] ?MRN: [id number] ? ? ?Date of Service ? 06/20/2021  ?HPI/Events of Note ? K 3.5 in setting of increasing Cr ?Diuresis discontinued yesterday  ?eICU Interventions ? Hold off on K repletion  ? ? ? ?  ? ?[name] ?06/20/2021, 4:58 AM ?

## 2021-06-20 NOTE — Progress Notes (Incomplete)
06/20/2021  I have seen and evaluated the patient for acute on chronic hypoxemic and hypercarbic resp failure.  S:    O: Blood pressure (!) 158/70, pulse 100, temperature 97.7 F (36.5 C), resp. rate (!) 21, height 5\' 5"  (1.651 m), weight 61.6 kg, SpO2 99 %.  Constitutional: ***  Eyes: *** Ears, nose, mouth, and throat: *** Cardiovascular: *** Respiratory: *** Gastrointestinal: *** Skin: No rashes, normal turgor Neurologic: *** Psychiatric: ***  ***  A:  Acute on chronic hypoxemic and hypercarbic respiratory failure in patient with vent-dependent end stage COPD at baseline.  Now complicated by pseudomonas and proteus pneumonia. Hx chronic severe anxiety.   Now with encephalopathy on top due to high sedation needs. PEG site wound POA Sacral wound POA HTN Hypothyroidism  P:  Vent support, VAP prevention bundle Limit DP as allowed by lung compliance Sedation *** GOC ***  Patient critically ill due to *** Interventions to address this today *** Risk of deterioration without these interventions is high  I personally spent *** minutes providing critical care not including any separately billable procedures  Erskine Emery MD [facility] Prefer epic messenger for cross cover needs If after hours, please call E-link

## 2021-06-21 ENCOUNTER — Inpatient Hospital Stay (HOSPITAL_COMMUNITY): Payer: Medicare Other

## 2021-06-21 DIAGNOSIS — J9611 Chronic respiratory failure with hypoxia: Secondary | ICD-10-CM | POA: Diagnosis not present

## 2021-06-21 LAB — CBC
HCT: 28.5 % — ABNORMAL LOW (ref 36.0–46.0)
Hemoglobin: 8.6 g/dL — ABNORMAL LOW (ref 12.0–15.0)
MCH: 27.5 pg (ref 26.0–34.0)
MCHC: 30.2 g/dL (ref 30.0–36.0)
MCV: 91.1 fL (ref 80.0–100.0)
Platelets: 189 10*3/uL (ref 150–400)
RBC: 3.13 MIL/uL — ABNORMAL LOW (ref 3.87–5.11)
RDW: 17.9 % — ABNORMAL HIGH (ref 11.5–15.5)
WBC: 6.6 10*3/uL (ref 4.0–10.5)
nRBC: 0.5 % — ABNORMAL HIGH (ref 0.0–0.2)

## 2021-06-21 LAB — GLUCOSE, CAPILLARY
Glucose-Capillary: 104 mg/dL — ABNORMAL HIGH (ref 70–99)
Glucose-Capillary: 122 mg/dL — ABNORMAL HIGH (ref 70–99)
Glucose-Capillary: 135 mg/dL — ABNORMAL HIGH (ref 70–99)
Glucose-Capillary: 141 mg/dL — ABNORMAL HIGH (ref 70–99)
Glucose-Capillary: 157 mg/dL — ABNORMAL HIGH (ref 70–99)
Glucose-Capillary: 180 mg/dL — ABNORMAL HIGH (ref 70–99)
Glucose-Capillary: 193 mg/dL — ABNORMAL HIGH (ref 70–99)

## 2021-06-21 LAB — BASIC METABOLIC PANEL
Anion gap: 10 (ref 5–15)
BUN: 93 mg/dL — ABNORMAL HIGH (ref 6–20)
CO2: 27 mmol/L (ref 22–32)
Calcium: 8.5 mg/dL — ABNORMAL LOW (ref 8.9–10.3)
Chloride: 95 mmol/L — ABNORMAL LOW (ref 98–111)
Creatinine, Ser: 2.25 mg/dL — ABNORMAL HIGH (ref 0.44–1.00)
GFR, Estimated: 25 mL/min — ABNORMAL LOW (ref >60)
Glucose, Bld: 185 mg/dL — ABNORMAL HIGH (ref 70–99)
Potassium: 4.5 mmol/L (ref 3.5–5.1)
Sodium: 132 mmol/L — ABNORMAL LOW (ref 135–145)

## 2021-06-21 LAB — MAGNESIUM: Magnesium: 2.7 mg/dL — ABNORMAL HIGH (ref 1.7–2.4)

## 2021-06-21 MED ORDER — ALBUTEROL SULFATE (2.5 MG/3ML) 0.083% IN NEBU
2.5000 mg | INHALATION_SOLUTION | Freq: Three times a day (TID) | RESPIRATORY_TRACT | Status: DC
  Administered 2021-06-21 – 2021-06-24 (×11): 2.5 mg via RESPIRATORY_TRACT
  Filled 2021-06-21 (×11): qty 3

## 2021-06-21 MED ORDER — VECURONIUM BROMIDE 10 MG IV SOLR
10.0000 mg | Freq: Once | INTRAVENOUS | Status: AC
  Administered 2021-06-21: 10 mg via INTRAVENOUS
  Filled 2021-06-21: qty 10

## 2021-06-21 MED ORDER — SODIUM CHLORIDE 0.9 % IV SOLN: 2.0000 g | INTRAVENOUS | Status: DC

## 2021-06-21 MED ORDER — VECURONIUM BROMIDE 10 MG IV SOLR
INTRAVENOUS | Status: AC
  Administered 2021-06-21: 8 mg via INTRAVENOUS
  Filled 2021-06-21: qty 10

## 2021-06-21 MED ORDER — METHYLPREDNISOLONE SODIUM SUCC 125 MG IJ SOLR
80.0000 mg | Freq: Two times a day (BID) | INTRAMUSCULAR | Status: DC
  Administered 2021-06-21 – 2021-06-24 (×7): 80 mg via INTRAVENOUS
  Filled 2021-06-21 (×7): qty 2

## 2021-06-21 MED ORDER — SODIUM CHLORIDE 0.9 % IV SOLN
0.5000 ug/kg/min | INTRAVENOUS | Status: DC
  Administered 2021-06-22: 3 ug/kg/min via INTRAVENOUS
  Filled 2021-06-21: qty 20

## 2021-06-21 MED ORDER — PANTOPRAZOLE 2 MG/ML SUSPENSION
40.0000 mg | Freq: Every day | ORAL | Status: DC
  Administered 2021-06-21 – 2021-06-24 (×4): 40 mg
  Filled 2021-06-21 (×4): qty 20

## 2021-06-21 MED ORDER — STERILE WATER FOR INJECTION IJ SOLN
INTRAMUSCULAR | Status: AC
  Administered 2021-06-21: 10 mL
  Filled 2021-06-21: qty 10

## 2021-06-21 MED ORDER — ROCURONIUM BROMIDE 10 MG/ML (PF) SYRINGE
PREFILLED_SYRINGE | Freq: Once | INTRAVENOUS | Status: AC
  Administered 2021-06-21: 70 mg via INTRAVENOUS
  Filled 2021-06-21: qty 10

## 2021-06-21 MED ORDER — VECURONIUM BROMIDE 10 MG IV SOLR: 8.0000 mg | Freq: Once | INTRAVENOUS | Status: AC

## 2021-06-21 NOTE — Progress Notes (Signed)
Initial Nutrition Assessment ? ?DOCUMENTATION CODES:  ? ?Not applicable ? ?INTERVENTION:  ? ?Tube Feeding via Cortrak:  ?Osmolite 1.2 at 60 ml/hr ?This provides 80 g of protein, 1728 kcals and 1166 mL of free water ? ?NUTRITION DIAGNOSIS:  ? ?Inadequate oral intake related to acute illness as evidenced by NPO status. ? ?Being addressed via TF  ? ?GOAL:  ? ?Patient will meet greater than or equal to 90% of their needs ? ?Progressing ? ?MONITOR:  ? ?Diet advancement, Labs, Weight trends ? ?REASON FOR ASSESSMENT:  ? ?Ventilator ?  ? ?ASSESSMENT:  ? ?56 yo female admitted with acute on chronic respiratory failure, vent/trach at baseline. PMH includes severe COPD, chronic respiratory failure-vent/trach at home, anxiety, HTN ? ?2/20 Admit ?2/21 Bronch ?2/22 Cortrak placed, TF initiated ?3/01 Cortrak replaced ? ?Pt remains on vent support via trach; continues with vent dyssynchrony, unable to wean sedation.  ? ?[place]is[place] discussions ongoing, remains full code ? ?Tolerating Osmolite 1.2 at goal rate of 60 ml/hr via Cortrak ? ?Labs: sodium 132 (L) ?Meds: reglan, ss novolog, semglee, solumedrol ? ? ?Diet Order:   ?Diet Order   ? ?       ?  Diet NPO time specified  Diet effective midnight       ?  ? ?  ?  ? ?  ? ? ?EDUCATION NEEDS:  ? ?Not appropriate for education at this time ? ?Skin:  Skin Assessment: Skin Integrity Issues: ?Skin Integrity Issues:: Stage II ?Stage II: sacrum ? ?Last BM:  3/3 ? ?Height:  ? ?Ht Readings from Last 1 Encounters:  ?06/16/21 5\' 5"  (1.651 m)  ? ? ?Weight:  ? ?Wt Readings from Last 1 Encounters:  ?06/21/21 61.6 kg  ? ? ? ?BMI:  Body mass index is 22.6 kg/m?. ? ?Estimated Nutritional Needs:  ? ?Kcal:  1600-1800 kcals ? ?Protein:  80-90 g ? ?Fluid:  >/= 1.6 L ? ? ?Kerman Passey MS, RDN, LDN, CNSC ?Registered Dietitian III ?Clinical Nutrition ?RD Pager and On-Call Pager Number Located in [place]  ? ?

## 2021-06-21 NOTE — Progress Notes (Signed)
Vent dyssynchrony throughout the night. CCM is aware of this per documented provider notes. PIP in the upper 40's low 50's.  ? ?Kathleen Cameron L. Katrinka Blazing, BS, RRT-ACCS, RCP ?

## 2021-06-21 NOTE — Progress Notes (Signed)
eLink Physician-Brief Progress Note ?Patient Name: [name] ?DOB: [birth year] ?MRN: [id number] ? ? ?Date of Service ? 06/21/2021  ?HPI/Events of Note ? 56 y/o F, a/w COPD exacerbation, GNR Pneumonia, Pseudomonas, has had ongoing problems with ventilator management, dysynchrony, breath stacking autoPEEP, requiring parlaytics. Called by RT/RN requesting another round of paralytic   ?eICU Interventions ? - Vecuronium ordered  ?- check CXR  ?- prior films do not look like very stiff lungs/diffuse infiltrates, just hyperinflated ?- may be able to liberalize TV's slightly she is on 470, will check Plateau pressures, if < 25-30 can try to increase a bit  ? ? ? ?  ? ?Kathleen Cameron ?06/21/2021, 8:55 PM ?

## 2021-06-21 NOTE — Progress Notes (Signed)
eLink Physician-Brief Progress Note ?Patient Name: [name] ?DOB: May 13, 1965 ?MRN: [id number] ? ? ?Date of Service ? 06/21/2021  ?HPI/Events of Note ?  56 y/o F, a/w COPD exacerbation, GNR Pneumonia, Pseudomonas, has had ongoing problems with ventilator management, dysynchrony, breath stacking autoPEEP, requiring parlaytics. Called by RT/RN requesting another round of paralytic ? ?This was ordered, seemed to help, but now desaturated to 80's, back on 100% fiO2. CXR is unremarkable, but that was done prior to this event   ?  ?eICU Interventions ? Will check another CXR. May have mucus plugged  ? ?Waveforms now looks better, no autoPEEP, no more breath stacking.   ? ? ? ?  ? ?Idell Pickles Mehak Roskelley ?06/21/2021, 10:18 PM ?

## 2021-06-21 NOTE — Progress Notes (Signed)
? ? ? ? ?NAME:  Kathleen Cameron, MRN:  [id number], DOB:  [birth year], LOS: 37 ?ADMISSION DATE:  06/08/2021, CONSULTATION DATE:  06/10/2021 ?REFERRING MD:  Ralene Bathe - EDP CHIEF COMPLAINT: Chest pain ? ?History of Present Illness:  ?56 year old woman who presented to [facility] ED 2/19 with chest pain. PMHx significant for HTN, severe COPD (followed by Dr. Lamonte Sakai), chronic respiratory failure with tracheostomy in place, anxiety. ? ?Prior to arrival, patient recently had change in ventilation from TV 480cc down to TV of 400cc. She has presented to the ED 3 times in the past 24 hours.  Discharged from the ED at 1500 after presenting with abdominal pain.  CT Chest/A/P negative for acute abnormality within the chest, abdomen, or pelvis. Presented again at 2200 with chest pain. ? ?ED course notable for stable troponin (18 > 14), slightly elevated D-dimer, CTA Chest negative for PE but with concern for acute airway narrowing and opacification at the left mainstem bronchus, left lower lobar bronchus and lower subsegmental lower lobe airways.  ? ?PCCM was consulted for admission.   ? ?Pertinent Medical History:  ?Severe COPD, Chronic respiratory failure with tracheostomy in place, hypertension, anxiety ? ?Significant Hospital Events: ?Including procedures, antibiotic start and stop dates in addition to other pertinent events   ?2/19-20: presented to ED x3 times. Abd pain: CT chest/abd/pelvis negative for acute process. CT chest: concern for L airway narrowing.  ?2/21 restless and anxious on vent overnight, received one dose paralytic and fentanyl.  Bronch demonstrating dynamic collapse of bilateral mainstem. ?2/22 Improved vent mechanics, remains on Propofol/Fentanyl; Cortrak placement for nutritional support. ?2/23 Vent dyssynchrony/breath stacking with increased pressures on vent early AM. Resolved with additional dose roc, sedation adjustment. ?2/24 Ongoing vent dyssynchrony throughout the last 24 hours requiring 6 PRN doses of roc.   ?2/25 Patient on minimal vent settings and clinical improvement with pneumonia. Attempted to wean off sedation however had increased peak pressures due to EDAC and bronchospasm. Required vec 4PM and 7PM. ?2/28 AM required vec x2 ?3/1 switched from precedex to propofol ?3/3 no improvement intolerant of any decrease in sedation with ventilator noncompliance/dyssynchrony family gathering for goals of care discussion ? ?Interim History / Subjective:  ? ?Still no improvement ?Objective:  ?Blood pressure 130/69, pulse 81, temperature (Abnormal) 97.5 ?F (36.4 ?C), resp. rate 18, height 5\' 5"  (1.651 m), weight 61.6 kg, SpO2 95 %. ?   ?Vent Mode: PRVC ?FiO2 (%):  [40 %] 40 % ?Set Rate:  [22 bmp] 22 bmp ?Vt Set:  [470 mL] 470 mL ?PEEP:  [10 cmH20-14 cmH20] 10 cmH20 ?Plateau Pressure:  [26 cmH20-41 cmH20] 38 cmH20  ? ?Intake/Output Summary (Last 24 hours) at 06/21/2021 1038 ?Last data filed at 06/21/2021 0900 ?Gross per 24 hour  ?Intake 2394.82 ml  ?Output 710 ml  ?Net 1684.82 ml  ? ?Filed Weights  ? 06/19/21 0351 06/20/21 0500 06/21/21 0500  ?Weight: 58 kg 61.6 kg 61.6 kg  ? ?General 56 year old female remains ventilator dependent ?HEENT normocephalic atraumatic exophthalmia noted unremarkable ?Pulmonary diminished throughout becomes rapidly tachypneic and dyssynchronous ?Cardiac regular rate and rhythm ?Abdomen distended and firm bowel sounds present ?Extremities dependent edema ?Neuro heavily sedated intolerant of weaning sedation ?GU concentrated yellow urine ? ?Resolved Hospital Problem List:  ?Abdominal pain - CT A/P negative for acute process.  History of PEG tube.  Lipase normal. ?Chest pain ?Hyperkalemia  ?Septic shock vs sedation related hypotension ? 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  Husband states that symptoms initially noted on with vent changes made 2/17 ?-She is intolerant of any decreased sedation with fairly significant vent dyssynchrony ?Plan ?Continuing full ventilator support  ?No change in current Solu-Medrol dosing, however I do not think this is helped  ?Currently titrate PEEP and FiO2  ?VAP bundle  ?PAD protocol  ?Continuing cefepime for Pseudomonas  ?Continuing Pulmicort, Garlon Hatchet and Yupelri with as needed [place]  ?Holding Lasix today  ? ?Acute metabolic encephalopathy with agitated delirium and underlying anxiety  ?Plan ?Continuing scheduled oxycodone every 4, with no plan to discharge her on this  ?Increase Klonopin to 2 mg 3 times daily  ?Continue to wean Precedex and propofol as able  ? ? ?AKI, serum creatinine continues to worsen  ?Plan  ?Hold diuresis today ?Renal dose medications ?A.m. chemistry ? ?History of hypertension ?Requiring high doses propofol. ?Plan ?Holding her Norvasc for now ? ?PEG tube site wound, POA ?Sacral wound ?Plan ?Continue local wound care per wound ostomy team ? ?Hypothyroidism ?Plan ?Synthroid  ? ?Chronic, normocytic anemia ?No evidence of bleeding ?Plan ?Trend CBC ?Trigger for transfusion less than 7 ? ? ?Hyperglycemia ?Plan ?Sliding scale insulin ? ?Intermittent fluid and electrolyte imbalance: ?Plan ?Monitor replace as indicated ? ? ?Inadequate oral intake related to acute illness ?At risk for malnutrition ?PEG removed PTA. Previously eating well on vent, now with decreased PO  ?Plan  ?Continue tube feeds  ? ? ? ?Best Practice (right click and "Reselect all SmartList Selections" daily)  ? ?Diet/type: tubefeeds ?DVT prophylaxis: prophylactic heparin  ?GI prophylaxis: PPI, ?Lines: Arterial Line and yes and it is still needed,  ?Foley:  Yes, and it is still needed, duiresing ?Code Status:  full code ?Last date of multidisciplinary goals of care discussion 3/2  with Dr. Carlis Abbott at bedside ? ?Critical care time: 31 min  ? ?Erick Colace ACNP-BC ?[place] ?Pager # [phone] OR # [phone] if no answer ? ? ? ?

## 2021-06-21 NOTE — Progress Notes (Signed)
Pharmacy Antibiotic Note ? ?Kathleen Cameron is a 56 y.o. female admitted on 05/26/2021 with pneumonia.  Pharmacy has been consulted for cefepime dosing. ? ?WBC within normal limits. SCr trending up at 2/25 today. Afebrile.  ?Plan for 14 days of cefepime given respiratory cultures growing abundant pseudomonas and proteus.  ? ?Plan: ?Adjust Cefepime to 2g IV every 24 hours with worsening AKI.  ?Monitor renal fx, cx results, clinical pic ? ?Height: _0  (165.1 cm) ?Weight: 61.6 kg (135 lb 12.9 oz) ?IBW/kg (Calculated) : 57 ? ?Temp (24hrs), Avg:97.5 ?F (36.4 ?C), Min:96.6 ?F (35.9 ?C), Max:98.7 ?F (37.1 ?C) ? ?Recent Labs  ?Lab 06/17/21 ?0418 06/18/21 ?0421 06/19/21 ?1224 06/20/21 ?4975 06/21/21 ?0442  ?WBC 6.6 6.6 5.4 6.8 6.6  ?CREATININE 1.07* 1.22* 1.20* 1.81* 2.25*  ?  ?Estimated Creatinine Clearance: 24.8 mL/min (A) (by C-G formula based on SCr of 2.25 mg/dL (H)).   ? ?Allergies  ?Allergen Reactions  ? Stiolto Respimat [Tiotropium Bromide-Olodaterol] Other (See Comments)  ?  Severe headaches and rapid heartrate  ? ? ?Antimicrobials this admission: ?Azithro 2/20 > 2/21 ?Doxy 2/20 > 2/23  ?Ceftriaxone 2/21 > 2/23 ?Cefepime 2/23 >  ? ?Dose adjustments this admission: ?N/A ? ?Microbiology results: ?2/21 BCx > negF ?2/21 Resp virus panel - neg ?2/21 - BAL - abundant proteus mirabilis, abundant pseudomonas ? ?Thank you for allowing pharmacy to be a part of this patient?s care. ? ?Sloan Leiter, PharmD, BCPS, BCCCP ?Clinical Pharmacist ?Please refer to [facility] for [place] numbers ?06/21/2021 10:40 PM ? ?Please check AMION for all [place] phone numbers ?After 10:00 PM, call [place] [phone] ? ?

## 2021-06-21 NOTE — Progress Notes (Signed)
eLink Physician-Brief Progress Note ?Patient Name: Kathleen Cameron ?DOB: 11-Apr-1966 ?MRN: [id number] ? ? ?Date of Service ? 06/21/2021  ?HPI/Events of Note ? Severe vent dyssynchrony and breath stacking. Difficulty ventilating this entire hospital admission. Patient with RASS -4 on Fentanyl 200 and propofol.  ?eICU Interventions ? Give roc 70 mg  ? ? ? ?Intervention Category ?Major Interventions: Respiratory failure - evaluation and management ? ?Saivon Prowse Mechele Collin ?06/21/2021, 6:26 AM ?

## 2021-06-22 ENCOUNTER — Inpatient Hospital Stay (HOSPITAL_COMMUNITY): Payer: Medicare Other

## 2021-06-22 DIAGNOSIS — J9611 Chronic respiratory failure with hypoxia: Secondary | ICD-10-CM | POA: Diagnosis not present

## 2021-06-22 LAB — CBC
HCT: 30.1 % — ABNORMAL LOW (ref 36.0–46.0)
Hemoglobin: 9.1 g/dL — ABNORMAL LOW (ref 12.0–15.0)
MCH: 26.8 pg (ref 26.0–34.0)
MCHC: 30.2 g/dL (ref 30.0–36.0)
MCV: 88.8 fL (ref 80.0–100.0)
Platelets: 204 10*3/uL (ref 150–400)
RBC: 3.39 MIL/uL — ABNORMAL LOW (ref 3.87–5.11)
RDW: 17.8 % — ABNORMAL HIGH (ref 11.5–15.5)
WBC: 8.9 10*3/uL (ref 4.0–10.5)
nRBC: 0.2 % (ref 0.0–0.2)

## 2021-06-22 LAB — GLUCOSE, CAPILLARY
Glucose-Capillary: 111 mg/dL — ABNORMAL HIGH (ref 70–99)
Glucose-Capillary: 115 mg/dL — ABNORMAL HIGH (ref 70–99)
Glucose-Capillary: 119 mg/dL — ABNORMAL HIGH (ref 70–99)
Glucose-Capillary: 120 mg/dL — ABNORMAL HIGH (ref 70–99)
Glucose-Capillary: 195 mg/dL — ABNORMAL HIGH (ref 70–99)
Glucose-Capillary: 206 mg/dL — ABNORMAL HIGH (ref 70–99)

## 2021-06-22 LAB — COMPREHENSIVE METABOLIC PANEL
ALT: 32 U/L (ref 0–44)
AST: 29 U/L (ref 15–41)
Albumin: 3 g/dL — ABNORMAL LOW (ref 3.5–5.0)
Alkaline Phosphatase: 89 U/L (ref 38–126)
Anion gap: 13 (ref 5–15)
BUN: 111 mg/dL — ABNORMAL HIGH (ref 6–20)
CO2: 23 mmol/L (ref 22–32)
Calcium: 8.6 mg/dL — ABNORMAL LOW (ref 8.9–10.3)
Chloride: 95 mmol/L — ABNORMAL LOW (ref 98–111)
Creatinine, Ser: 2.84 mg/dL — ABNORMAL HIGH (ref 0.44–1.00)
GFR, Estimated: 19 mL/min — ABNORMAL LOW (ref >60)
Glucose, Bld: 216 mg/dL — ABNORMAL HIGH (ref 70–99)
Potassium: 5.8 mmol/L — ABNORMAL HIGH (ref 3.5–5.1)
Sodium: 131 mmol/L — ABNORMAL LOW (ref 135–145)
Total Bilirubin: 0.2 mg/dL — ABNORMAL LOW (ref 0.3–1.2)
Total Protein: 6.9 g/dL (ref 6.5–8.1)

## 2021-06-22 LAB — MAGNESIUM: Magnesium: 2.7 mg/dL — ABNORMAL HIGH (ref 1.7–2.4)

## 2021-06-22 LAB — BASIC METABOLIC PANEL
Anion gap: 15 (ref 5–15)
BUN: 117 mg/dL — ABNORMAL HIGH (ref 6–20)
CO2: 24 mmol/L (ref 22–32)
Calcium: 8.5 mg/dL — ABNORMAL LOW (ref 8.9–10.3)
Chloride: 96 mmol/L — ABNORMAL LOW (ref 98–111)
Creatinine, Ser: 3.18 mg/dL — ABNORMAL HIGH (ref 0.44–1.00)
GFR, Estimated: 16 mL/min — ABNORMAL LOW (ref >60)
Glucose, Bld: 167 mg/dL — ABNORMAL HIGH (ref 70–99)
Potassium: 4.9 mmol/L (ref 3.5–5.1)
Sodium: 135 mmol/L (ref 135–145)

## 2021-06-22 LAB — TSH: TSH: 27.473 u[IU]/mL — ABNORMAL HIGH (ref 0.350–4.500)

## 2021-06-22 LAB — PHOSPHORUS: Phosphorus: 5.3 mg/dL — ABNORMAL HIGH (ref 2.5–4.6)

## 2021-06-22 MED ORDER — CISATRACURIUM BOLUS VIA INFUSION
0.0500 mg/kg | Freq: Once | INTRAVENOUS | Status: AC
  Administered 2021-06-22: 3.1 mg via INTRAVENOUS
  Filled 2021-06-22: qty 7

## 2021-06-22 MED ORDER — SODIUM BICARBONATE 8.4 % IV SOLN
50.0000 meq | Freq: Once | INTRAVENOUS | Status: AC
  Administered 2021-06-22: 50 meq via INTRAVENOUS
  Filled 2021-06-22: qty 50

## 2021-06-22 MED ORDER — ARTIFICIAL TEARS OPHTHALMIC OINT
1.0000 "application " | TOPICAL_OINTMENT | Freq: Three times a day (TID) | OPHTHALMIC | Status: DC
  Administered 2021-06-22 – 2021-06-24 (×7): 1 via OPHTHALMIC

## 2021-06-22 MED ORDER — CISATRACURIUM BOLUS VIA INFUSION
10.0000 mg | Freq: Once | INTRAVENOUS | Status: AC
  Administered 2021-06-22: 10 mg via INTRAVENOUS
  Filled 2021-06-22: qty 10

## 2021-06-22 MED ORDER — SODIUM CHLORIDE 0.9 % IV SOLN
0.0000 ug/kg/min | INTRAVENOUS | Status: DC
  Administered 2021-06-22: 6 ug/kg/min via INTRAVENOUS
  Administered 2021-06-23: 4 ug/kg/min via INTRAVENOUS
  Administered 2021-06-24: 3.5 ug/kg/min via INTRAVENOUS
  Filled 2021-06-22 (×5): qty 20

## 2021-06-22 MED ORDER — SODIUM CHLORIDE 0.9 % IV SOLN
2.0000 g | INTRAVENOUS | Status: DC
  Administered 2021-06-22: 2 g via INTRAVENOUS
  Filled 2021-06-22: qty 2

## 2021-06-22 MED ORDER — LACTULOSE 10 GM/15ML PO SOLN
10.0000 g | Freq: Every day | ORAL | Status: DC
  Administered 2021-06-22 – 2021-06-24 (×3): 10 g
  Filled 2021-06-22 (×3): qty 15

## 2021-06-22 MED ORDER — SODIUM POLYSTYRENE SULFONATE 15 GM/60ML PO SUSP
30.0000 g | Freq: Once | ORAL | Status: AC
Start: 2021-06-22 — End: 2021-06-22
  Administered 2021-06-22: 30 g
  Filled 2021-06-22: qty 120

## 2021-06-22 MED ORDER — POLYETHYLENE GLYCOL 3350 17 G PO PACK
17.0000 g | PACK | Freq: Two times a day (BID) | ORAL | Status: DC
  Administered 2021-06-22 – 2021-06-24 (×5): 17 g
  Filled 2021-06-22 (×5): qty 1

## 2021-06-22 MED ORDER — LEVOTHYROXINE SODIUM 88 MCG PO TABS
88.0000 ug | ORAL_TABLET | Freq: Every day | ORAL | Status: DC
  Administered 2021-06-22 – 2021-06-24 (×3): 88 ug
  Filled 2021-06-22 (×3): qty 1

## 2021-06-22 NOTE — Progress Notes (Signed)
? ? ? ? ?NAME:  Kathleen Cameron, MRN:  [id number], DOB:  1965/08/21, LOS: 12 ?ADMISSION DATE:  05/31/2021, CONSULTATION DATE:  06/10/2021 ?REFERRING MD:  Ralene Bathe - EDP CHIEF COMPLAINT: Chest pain ? ?History of Present Illness:  ?56 year old woman who presented to [facility] ED 2/19 with chest pain. PMHx significant for HTN, severe COPD (followed by Dr. Lamonte Sakai), chronic respiratory failure with tracheostomy in place, anxiety. ? ?Prior to arrival, patient recently had change in ventilation from TV 480cc down to TV of 400cc. She has presented to the ED 3 times in the past 24 hours.  Discharged from the ED at 1500 after presenting with abdominal pain.  CT Chest/A/P negative for acute abnormality within the chest, abdomen, or pelvis. Presented again at 2200 with chest pain. ? ?ED course notable for stable troponin (18 > 14), slightly elevated D-dimer, CTA Chest negative for PE but with concern for acute airway narrowing and opacification at the left mainstem bronchus, left lower lobar bronchus and lower subsegmental lower lobe airways.  ? ?PCCM was consulted for admission.   ? ?Pertinent Medical History:  ?Severe COPD, Chronic respiratory failure with tracheostomy in place, hypertension, anxiety ? ?Significant Hospital Events: ?Including procedures, antibiotic start and stop dates in addition to other pertinent events   ?2/19-20: presented to ED x3 times. Abd pain: CT chest/abd/pelvis negative for acute process. CT chest: concern for L airway narrowing.  ?2/21 restless and anxious on vent overnight, received one dose paralytic and fentanyl.  Bronch demonstrating dynamic collapse of bilateral mainstem. ?2/22 Improved vent mechanics, remains on Propofol/Fentanyl; Cortrak placement for nutritional support. ?2/23 Vent dyssynchrony/breath stacking with increased pressures on vent early AM. Resolved with additional dose roc, sedation adjustment. ?2/24 Ongoing vent dyssynchrony throughout the last 24 hours requiring 6 PRN doses of roc.   ?2/25 Patient on minimal vent settings and clinical improvement with pneumonia. Attempted to wean off sedation however had increased peak pressures due to EDAC and bronchospasm. Required vec 4PM and 7PM. ?2/28 AM required vec x2 ?3/1 switched from precedex to propofol ?3/3 no improvement intolerant of any decrease in sedation with ventilator noncompliance/dyssynchrony family gathering for goals of care discussion ? ?Interim History / Subjective:  ?Worsening renal failure. ?Now need paralytics to prevent further deterioration. ?Bps are dropping. ? ?In dying process. ? ?Objective:  ?Blood pressure 95/63, pulse 85, temperature 98.6 ?F (37 ?C), resp. rate (!) 22, height 5\' 5"  (1.651 m), weight 61.6 kg, SpO2 100 %. ?   ?Vent Mode: PRVC ?FiO2 (%):  [40 %-100 %] 50 % ?Set Rate:  [22 bmp] 22 bmp ?Vt Set:  [470 mL-500 mL] 500 mL ?PEEP:  [10 cmH20-14 cmH20] 14 cmH20 ?Plateau Pressure:  [36 cmH20-46 cmH20] 46 cmH20  ? ?Intake/Output Summary (Last 24 hours) at 06/22/2021 0901 ?Last data filed at 06/22/2021 0600 ?Gross per 24 hour  ?Intake 1667.7 ml  ?Output 420 ml  ?Net 1247.7 ml  ? ? ?Filed Weights  ? 06/20/21 0500 06/21/21 0500 06/22/21 0500  ?Weight: 61.6 kg 61.6 kg 61.6 kg  ? ?Intubated, sedated paralyzed ?Diminished breath sounds bilaterally, passive on vent ?Developing severe anasarca ?Exopthalmos, chemosis ?Ext warm to touch, heart sounds regular ? ?Worsening renal function ?ABG looks good ?CBC stable ? ?Resolved Hospital Problem List:  ?Abdominal pain - CT A/P negative for acute process.  History of PEG tube.  Lipase normal. ?Chest pain ?Hyperkalemia  ?Septic shock vs sedation related hypotension ? ?Assessment & Plan:  ?Acute on chronic hypoxemic and hypercarbic respiratory failure in patient with vent-dependent  end stage COPD at baseline.  Now complicated by pseudomonas and proteus pneumonia. ?Hx chronic severe anxiety.   ?Now with encephalopathy on top due to high sedation needs. ?PEG site wound POA ?Sacral wound  POA ?HTN ?Hypothyroidism- resuming synthroid ?Worsening renal failure with hyperkalemia- not HD candidate ? ?Continue vent support with NMB gtt given issues with ventilation and desats overnight. ? ?Will discuss futility of further aggressive care with husband when he comes in. ? ?Continue cefepime for now ? ?Treat hyperkalemia medically ? ?Best Practice (right click and "Reselect all SmartList Selections" daily)  ? ?Diet/type: tubefeeds ?DVT prophylaxis: prophylactic heparin  ?GI prophylaxis: PPI, ?Lines: Arterial Line and yes and it is still needed,  ?Foley:  Yes, and it is still needed, duiresing ?Code Status:  full code ?Last date of multidisciplinary goals of care: will do today ? ?Critical care time: 35 min  ? ?Erskine Emery MD ?[place] ? ? ? ? ?

## 2021-06-22 NOTE — Significant Event (Addendum)
Family at bedside. ?  ?Discussed terminal clinical status with worsening renal failure, inability to wean from paralytics. ?  ?They agree she would not want chest compressions should she deteriorate further. ?  ?Family still coming in to say goodbye then will likely transition to comfort. ?  ?Continue aggressive care in interim, allow natural death in event of cardiac arrest. ?  ?Myrla Halsted MD PCCM ?

## 2021-06-22 NOTE — TOC Progression Note (Signed)
Transition of Care (TOC) - Progression Note  ? ? ?Patient Details  ?Name: Kathleen Cameron ?MRN: [id number] ?Date of Birth: [birth year] ? ?Transition of Care (TOC) CM/SW Contact  ?Gala Lewandowsky, RN ?Phone Number: ?06/22/2021, 8:49 AM ? ?Clinical Narrative:  Case Manager is continuing to follow the patient. Per notes family to discuss GOC. Robert Medlin with [name] is following the patient and he can be reached at [phone] with any transition of care needs.  ? ?Expected Discharge Plan: Home w Home Health Services ?Barriers to Discharge: Continued Medical Work up ? ?Expected Discharge Plan and Services ?Expected Discharge Plan: Home w Home Health Services ?In-house Referral: NA ?Discharge Planning Services: CM Consult ?Post Acute Care Choice: Home Health, Resumption of Svcs/PTA Provider ?Living arrangements for the past 2 months: Apartment ?                ?  ?DME Agency: NA ?  ?  ?  ?  ?HH Agency: [facility] (Adult side) ?Date HH Agency Contacted: 06/10/21 ?Time HH Agency Contacted: 1643 ?Representative spoke with at [facility] Agency: Rob ? ? ?Readmission Risk Interventions ?Readmission Risk Prevention Plan 06/10/2021 02/02/2020  ?Transportation Screening Complete Complete  ?PCP or Specialist Appt within 3-5 Days - Complete  ?HRI or Home Care Consult - Complete  ?Social Work Consult for Recovery Care Planning/Counseling - Complete  ?Palliative Care Screening - Not Applicable  ?Medication Review Oceanographer) Complete Complete  ?HRI or Home Care Consult Complete -  ?SW Recovery Care/Counseling Consult Complete -  ?Palliative Care Screening Not Applicable -  ?Skilled Nursing Facility Not Applicable -  ?Some recent data might be hidden  ? ? ?

## 2021-06-22 NOTE — Progress Notes (Signed)
eLink Physician-Brief Progress Note ?Patient Name: Kinzy Weyers ?DOB: 1966-01-23 ?MRN: [id number] ? ? ?Date of Service ? 06/22/2021  ?HPI/Events of Note ? 56 y/o F, a/w COPD exacerbation, GNR Pneumonia, Pseudomonas, has had ongoing problems with ventilator management, dysynchrony, breath stacking autoPEEP, requiring parlaytics. Called by RT/RN requesting another round of paralytic ?  ?This was ordered, seemed to help, wore off quickly, desaturated to 80's, back on 100% fiO2. pH 7.19 (not able to see in Epic) CXR was unrevealing   ?eICU Interventions ? - placed on Nimbex infusion- pH now normalized ?- profound air trapping ?- unlikely to do better than this with Co2 60-70 at best.  ?- attempt wean off Nimbex in AM ?- continue current vent sets ?- needs palliative care discussion  ? ? ? ?  ? ?Idell Pickles Ayodeji Keimig ?06/22/2021, 3:48 AM ?

## 2021-06-22 NOTE — Progress Notes (Signed)
Patient started on a Nimbex drip and Blood gases redrawn post 1hr. ? ?Current ABG results (Not Crossing over) ? ?PH: 7.328 ?Pco2: 49.3 ?PO2: 157 ?HCO3:25.9 ?TCO2: 27 ?

## 2021-06-22 NOTE — Progress Notes (Signed)
The patient's ring was given to the husband to take home. ?

## 2021-06-22 NOTE — Progress Notes (Signed)
Unable to get in touch with husband, VM left by RN. ? ?Erskine Emery MD PCCM ?

## 2021-06-23 DIAGNOSIS — J9611 Chronic respiratory failure with hypoxia: Secondary | ICD-10-CM | POA: Diagnosis not present

## 2021-06-23 LAB — GLUCOSE, CAPILLARY
Glucose-Capillary: 156 mg/dL — ABNORMAL HIGH (ref 70–99)
Glucose-Capillary: 168 mg/dL — ABNORMAL HIGH (ref 70–99)
Glucose-Capillary: 179 mg/dL — ABNORMAL HIGH (ref 70–99)
Glucose-Capillary: 182 mg/dL — ABNORMAL HIGH (ref 70–99)
Glucose-Capillary: 188 mg/dL — ABNORMAL HIGH (ref 70–99)
Glucose-Capillary: 195 mg/dL — ABNORMAL HIGH (ref 70–99)

## 2021-06-23 LAB — COMPREHENSIVE METABOLIC PANEL
ALT: 38 U/L (ref 0–44)
AST: 32 U/L (ref 15–41)
Albumin: 2.9 g/dL — ABNORMAL LOW (ref 3.5–5.0)
Alkaline Phosphatase: 83 U/L (ref 38–126)
Anion gap: 14 (ref 5–15)
BUN: 122 mg/dL — ABNORMAL HIGH (ref 6–20)
CO2: 27 mmol/L (ref 22–32)
Calcium: 8.1 mg/dL — ABNORMAL LOW (ref 8.9–10.3)
Chloride: 94 mmol/L — ABNORMAL LOW (ref 98–111)
Creatinine, Ser: 3.34 mg/dL — ABNORMAL HIGH (ref 0.44–1.00)
GFR, Estimated: 15 mL/min — ABNORMAL LOW (ref >60)
Glucose, Bld: 164 mg/dL — ABNORMAL HIGH (ref 70–99)
Potassium: 5.9 mmol/L — ABNORMAL HIGH (ref 3.5–5.1)
Sodium: 135 mmol/L (ref 135–145)
Total Bilirubin: 0.5 mg/dL (ref 0.3–1.2)
Total Protein: 6.9 g/dL (ref 6.5–8.1)

## 2021-06-23 LAB — PHOSPHORUS: Phosphorus: 8.4 mg/dL — ABNORMAL HIGH (ref 2.5–4.6)

## 2021-06-23 LAB — CBC
HCT: 28.2 % — ABNORMAL LOW (ref 36.0–46.0)
Hemoglobin: 8.8 g/dL — ABNORMAL LOW (ref 12.0–15.0)
MCH: 28 pg (ref 26.0–34.0)
MCHC: 31.2 g/dL (ref 30.0–36.0)
MCV: 89.8 fL (ref 80.0–100.0)
Platelets: 181 10*3/uL (ref 150–400)
RBC: 3.14 MIL/uL — ABNORMAL LOW (ref 3.87–5.11)
RDW: 18 % — ABNORMAL HIGH (ref 11.5–15.5)
WBC: 13.7 10*3/uL — ABNORMAL HIGH (ref 4.0–10.5)
nRBC: 0.2 % (ref 0.0–0.2)

## 2021-06-23 LAB — MAGNESIUM: Magnesium: 2.6 mg/dL — ABNORMAL HIGH (ref 1.7–2.4)

## 2021-06-23 LAB — TRIGLYCERIDES: Triglycerides: 88 mg/dL (ref <150)

## 2021-06-23 LAB — POTASSIUM
Potassium: 5.8 mmol/L — ABNORMAL HIGH (ref 3.5–5.1)
Potassium: 6.1 mmol/L — ABNORMAL HIGH (ref 3.5–5.1)

## 2021-06-23 MED ORDER — INSULIN ASPART 100 UNIT/ML IV SOLN
5.0000 [IU] | Freq: Once | INTRAVENOUS | Status: AC
  Administered 2021-06-23: 5 [IU] via INTRAVENOUS

## 2021-06-23 MED ORDER — DEXTROSE 50 % IV SOLN
25.0000 g | Freq: Once | INTRAVENOUS | Status: AC
  Administered 2021-06-23: 25 g via INTRAVENOUS
  Filled 2021-06-23: qty 50

## 2021-06-23 MED ORDER — SODIUM ZIRCONIUM CYCLOSILICATE 10 G PO PACK
10.0000 g | PACK | Freq: Once | ORAL | Status: AC
  Administered 2021-06-23: 10 g
  Filled 2021-06-23: qty 1

## 2021-06-23 MED ORDER — SODIUM ZIRCONIUM CYCLOSILICATE 10 G PO PACK
10.0000 g | PACK | Freq: Two times a day (BID) | ORAL | Status: DC
  Administered 2021-06-23 – 2021-06-24 (×2): 10 g
  Filled 2021-06-23 (×3): qty 1

## 2021-06-23 NOTE — Progress Notes (Signed)
? ? ? ? ?NAME:  Chrystle Gallus, MRN:  [id number], DOB:  10/13/1965, LOS: 63 ?ADMISSION DATE:  05/27/2021, CONSULTATION DATE:  06/10/2021 ?REFERRING MD:  Ralene Bathe - EDP CHIEF COMPLAINT: Chest pain ? ?History of Present Illness:  ?56 year old woman who presented to [facility] ED 2/19 with chest pain. PMHx significant for HTN, severe COPD (followed by Dr. Lamonte Sakai), chronic respiratory failure with tracheostomy in place, anxiety. ? ?Prior to arrival, patient recently had change in ventilation from TV 480cc down to TV of 400cc. She has presented to the ED 3 times in the past 24 hours.  Discharged from the ED at 1500 after presenting with abdominal pain.  CT Chest/A/P negative for acute abnormality within the chest, abdomen, or pelvis. Presented again at 2200 with chest pain. ? ?ED course notable for stable troponin (18 > 14), slightly elevated D-dimer, CTA Chest negative for PE but with concern for acute airway narrowing and opacification at the left mainstem bronchus, left lower lobar bronchus and lower subsegmental lower lobe airways.  ? ?PCCM was consulted for admission.   ? ?Pertinent Medical History:  ?Severe COPD, Chronic respiratory failure with tracheostomy in place, hypertension, anxiety ? ?Significant Hospital Events: ?Including procedures, antibiotic start and stop dates in addition to other pertinent events   ?2/19-20: presented to ED x3 times. Abd pain: CT chest/abd/pelvis negative for acute process. CT chest: concern for L airway narrowing.  ?2/21 restless and anxious on vent overnight, received one dose paralytic and fentanyl.  Bronch demonstrating dynamic collapse of bilateral mainstem. ?2/22 Improved vent mechanics, remains on Propofol/Fentanyl; Cortrak placement for nutritional support. ?2/23 Vent dyssynchrony/breath stacking with increased pressures on vent early AM. Resolved with additional dose roc, sedation adjustment. ?2/24 Ongoing vent dyssynchrony throughout the last 24 hours requiring 6 PRN doses of roc.   ?2/25 Patient on minimal vent settings and clinical improvement with pneumonia. Attempted to wean off sedation however had increased peak pressures due to EDAC and bronchospasm. Required vec 4PM and 7PM. ?2/28 AM required vec x2 ?3/1 switched from precedex to propofol ?3/3 no improvement intolerant of any decrease in sedation with ventilator noncompliance/dyssynchrony family gathering for goals of care discussion ? ?Interim History / Subjective:  ?Continues to do poorly. ?Family has been visiting saying goodbyes. ?Minimal UoP. ? ?Objective:  ?Blood pressure 132/64, pulse 92, temperature 98.1 ?F (36.7 ?C), resp. rate (!) 24, height 5\' 5"  (1.651 m), weight 61.4 kg, SpO2 100 %. ?   ?Vent Mode: PRVC ?FiO2 (%):  [30 %-50 %] 40 % ?Set Rate:  [24 bmp] 24 bmp ?Vt Set:  [450 mL] 450 mL ?PEEP:  [10 cmH20-14 cmH20] 14 cmH20 ?Plateau Pressure:  [27 cmH20-29 cmH20] 27 cmH20  ? ?Intake/Output Summary (Last 24 hours) at 06/23/2021 0905 ?Last data filed at 06/23/2021 0800 ?Gross per 24 hour  ?Intake 2495.23 ml  ?Output 580 ml  ?Net 1915.23 ml  ? ? ?Filed Weights  ? 06/21/21 0500 06/22/21 0500 06/23/21 0433  ?Weight: 61.6 kg 61.6 kg 61.4 kg  ? ?Intubated, sedated paralyzed ?Diminished breath sounds bilaterally, passive on vent ?Worsening severe anasarca ?Exopthalmos, chemosis stable ?Ext warm to touch, heart sounds regular ? ?Renal function worse ?ABG worse ? ?Resolved Hospital Problem List:  ?Abdominal pain - CT A/P negative for acute process.  History of PEG tube.  Lipase normal. ?Chest pain ?Hyperkalemia  ?Septic shock vs sedation related hypotension ? ?Assessment & Plan:  ?Acute on chronic hypoxemic and hypercarbic respiratory failure in patient with vent-dependent end stage COPD at baseline.  Now complicated  by pseudomonas and proteus pneumonia. ?Hx chronic severe anxiety.   ?PEG site wound POA ?Sacral wound POA ?HTN ?Hypothyroidism- resuming synthroid ?Worsening renal failure with hyperkalemia- not HD candidate ?DNR ? ?Family  saying goodbyes ? ?Treat hyperkalemia medically ? ?Further escalation in care is medically futile (no pressors). ? ?NMB with BIS-titrated sedation, acidemia will inevitably continue to worsen  ? ?Once family ready will need even heavier sedation so we can try to wean off NMB ? ?Continue cefepime for now ? ? ?Best Practice (right click and "Reselect all SmartList Selections" daily)  ? ?Diet/type: tubefeeds ?DVT prophylaxis: prophylactic heparin  ?GI prophylaxis: PPI, ?Lines: Arterial Line and yes and it is still needed,  ?Foley:  Yes, and it is still needed, duiresing ?Code Status:  DNR ?Last date of multidisciplinary goals of care: will do today ? ?Critical care time: 33 min  ? ?Erskine Emery MD ?[place] ? ? ? ? ?

## 2021-06-23 NOTE — Progress Notes (Signed)
eLink Physician-Brief Progress Note ?Patient Name: Kathleen Cameron ?DOB: [birth year] ?MRN: [id number] ? ? ?Date of Service ? 06/23/2021  ?HPI/Events of Note ? K at 5.9, Cr: 3.34 up from , glucise 164 ? ?Acute on Chronic typ2 failure/COPD  ?Worsening renal failure. Hyperkalemia. Non HD candidate per notes.   ?eICU Interventions ? Hyperkalemia ordered. Lokelma via NG tube. Insuline+dextrose. Follow K level.   ? ? ? ?Intervention Category ?Minor Interventions: Electrolytes abnormality - evaluation and management ? ?[name] ?06/23/2021, 5:43 AM ?

## 2021-06-24 ENCOUNTER — Other Ambulatory Visit: Payer: Self-pay | Admitting: Emergency Medicine

## 2021-06-24 DIAGNOSIS — J9611 Chronic respiratory failure with hypoxia: Secondary | ICD-10-CM | POA: Diagnosis not present

## 2021-06-24 MED ORDER — POLYVINYL ALCOHOL 1.4 % OP SOLN: 1.0000 [drp] | Freq: Four times a day (QID) | OPHTHALMIC | Status: DC | PRN

## 2021-06-24 MED ORDER — DIPHENHYDRAMINE HCL 50 MG/ML IJ SOLN: 25.0000 mg | INTRAMUSCULAR | Status: DC | PRN

## 2021-06-24 MED ORDER — GLYCOPYRROLATE 1 MG PO TABS
1.0000 mg | ORAL_TABLET | ORAL | Status: DC | PRN
  Filled 2021-06-24: qty 1

## 2021-06-24 MED ORDER — LORAZEPAM 2 MG/ML IJ SOLN
2.0000 mg | INTRAMUSCULAR | Status: DC | PRN
  Administered 2021-06-24: 2 mg via INTRAVENOUS
  Filled 2021-06-24: qty 2

## 2021-06-24 MED ORDER — HYDROXYZINE HCL 25 MG PO TABS: 25.0000 mg | ORAL_TABLET | Freq: Three times a day (TID) | ORAL | Status: DC | PRN

## 2021-06-24 MED ORDER — GLYCOPYRROLATE 0.2 MG/ML IJ SOLN: 0.2000 mg | INTRAMUSCULAR | Status: DC | PRN

## 2021-06-24 MED ORDER — GLYCOPYRROLATE 0.2 MG/ML IJ SOLN
0.2000 mg | INTRAMUSCULAR | Status: DC | PRN
Start: 2021-06-24 — End: 2021-06-25

## 2021-06-25 LAB — GLUCOSE, CAPILLARY
Glucose-Capillary: 207 mg/dL — ABNORMAL HIGH (ref 70–99)
Glucose-Capillary: 184 mg/dL — ABNORMAL HIGH (ref 70–99)

## 2021-06-27 LAB — POCT I-STAT 7, (LYTES, BLD GAS, ICA,H+H)
Acid-Base Excess: 1 mmol/L (ref 0.0–2.0)
Acid-Base Excess: 1 mmol/L (ref 0.0–2.0)
Acid-Base Excess: 0 mmol/L (ref 0.0–2.0)
Acid-Base Excess: 1 mmol/L (ref 0.0–2.0)
Acid-Base Excess: 1 mmol/L (ref 0.0–2.0)
Bicarbonate: 30.3 mmol/L — ABNORMAL HIGH (ref 20.0–28.0)
Bicarbonate: 30.8 mmol/L — ABNORMAL HIGH (ref 20.0–28.0)
Bicarbonate: 25.9 mmol/L (ref 20.0–28.0)
Bicarbonate: 29.8 mmol/L — ABNORMAL HIGH (ref 20.0–28.0)
Bicarbonate: 30.6 mmol/L — ABNORMAL HIGH (ref 20.0–28.0)
Bicarbonate: 29.7 mmol/L — ABNORMAL HIGH (ref 20.0–28.0)
Calcium, Ion: 1.19 mmol/L (ref 1.15–1.40)
Calcium, Ion: 1.2 mmol/L (ref 1.15–1.40)
Calcium, Ion: 1.2 mmol/L (ref 1.15–1.40)
Calcium, Ion: 1.17 mmol/L (ref 1.15–1.40)
Calcium, Ion: 1.19 mmol/L (ref 1.15–1.40)
Calcium, Ion: 1.09 mmol/L — ABNORMAL LOW (ref 1.15–1.40)
HCT: 27 % — ABNORMAL LOW (ref 36.0–46.0)
HCT: 28 % — ABNORMAL LOW (ref 36.0–46.0)
HCT: 29 % — ABNORMAL LOW (ref 36.0–46.0)
HCT: 28 % — ABNORMAL LOW (ref 36.0–46.0)
HCT: 28 % — ABNORMAL LOW (ref 36.0–46.0)
HCT: 32 % — ABNORMAL LOW (ref 36.0–46.0)
Hemoglobin: 9.2 g/dL — ABNORMAL LOW (ref 12.0–15.0)
Hemoglobin: 9.5 g/dL — ABNORMAL LOW (ref 12.0–15.0)
Hemoglobin: 9.9 g/dL — ABNORMAL LOW (ref 12.0–15.0)
Hemoglobin: 9.5 g/dL — ABNORMAL LOW (ref 12.0–15.0)
Hemoglobin: 9.5 g/dL — ABNORMAL LOW (ref 12.0–15.0)
Hemoglobin: 10.9 g/dL — ABNORMAL LOW (ref 12.0–15.0)
O2 Saturation: 100 %
O2 Saturation: 100 %
O2 Saturation: 157 %
O2 Saturation: 99 %
O2 Saturation: 100 %
O2 Saturation: 91 %
Patient temperature: 36.6
Patient temperature: 36.7
Patient temperature: 36.5
Potassium: 5.3 mmol/L — ABNORMAL HIGH (ref 3.5–5.1)
Potassium: 5.3 mmol/L — ABNORMAL HIGH (ref 3.5–5.1)
Potassium: 5.6 mmol/L — ABNORMAL HIGH (ref 3.5–5.1)
Potassium: 5 mmol/L (ref 3.5–5.1)
Potassium: 5.5 mmol/L — ABNORMAL HIGH (ref 3.5–5.1)
Potassium: 5.9 mmol/L — ABNORMAL HIGH (ref 3.5–5.1)
Sodium: 133 mmol/L — ABNORMAL LOW (ref 135–145)
Sodium: 133 mmol/L — ABNORMAL LOW (ref 135–145)
Sodium: 130 mmol/L — ABNORMAL LOW (ref 135–145)
Sodium: 134 mmol/L — ABNORMAL LOW (ref 135–145)
Sodium: 133 mmol/L — ABNORMAL LOW (ref 135–145)
Sodium: 132 mmol/L — ABNORMAL LOW (ref 135–145)
TCO2: 33 mmol/L — ABNORMAL HIGH (ref 22–32)
TCO2: 33 mmol/L — ABNORMAL HIGH (ref 22–32)
TCO2: 27 mmol/L (ref 22–32)
TCO2: 32 mmol/L (ref 22–32)
TCO2: 33 mmol/L — ABNORMAL HIGH (ref 22–32)
TCO2: 32 mmol/L (ref 22–32)
pCO2 arterial: 76.3 mmHg (ref 32–48)
pCO2 arterial: 80.6 mmHg (ref 32–48)
pCO2 arterial: 49.3 mmHg — ABNORMAL HIGH (ref 32–48)
pCO2 arterial: 73.5 mmHg (ref 32–48)
pCO2 arterial: 76.7 mmHg (ref 32–48)
pCO2 arterial: 88.7 mmHg (ref 32–48)
pH, Arterial: 7.19 — CL (ref 7.35–7.45)
pH, Arterial: 7.206 — ABNORMAL LOW (ref 7.35–7.45)
pH, Arterial: 7.328 — ABNORMAL LOW (ref 7.35–7.45)
pH, Arterial: 7.216 — ABNORMAL LOW (ref 7.35–7.45)
pH, Arterial: 7.208 — ABNORMAL LOW (ref 7.35–7.45)
pH, Arterial: 7.123 — CL (ref 7.35–7.45)
pO2, Arterial: 379 mmHg — ABNORMAL HIGH (ref 83–108)
pO2, Arterial: 463 mmHg — ABNORMAL HIGH (ref 83–108)
pO2, Arterial: 157 mmHg — ABNORMAL HIGH (ref 83–108)
pO2, Arterial: 146 mmHg — ABNORMAL HIGH (ref 83–108)
pO2, Arterial: 460 mmHg — ABNORMAL HIGH (ref 83–108)
pO2, Arterial: 94 mmHg (ref 83–108)

## 2021-07-15 ENCOUNTER — Other Ambulatory Visit: Payer: Self-pay | Admitting: Emergency Medicine

## 2021-07-20 NOTE — Progress Notes (Signed)
?  PCCM Interval Note ? ?Husband and daughters at bedside and ready to transition to comfort care.  Nimbex gtt off 1625.  Till TOF 4/4 reached will hold off on MV removal.   ? ?P:  ?All meds not related to comfort stopped.  ?Will continue on fentanyl and propofol gtts as needed for any discomfort, pain, agitation, or vent dyssynchrony in addition to prn ativan, zofran, and robinul.   ?Ongoing emotional support.  ? ? ? ? ? ? ?Posey Boyer, ACNP ?[facility] ?07/13/2021, 4:48 PM ? ?See Amion for pager ?If no response to pager, please call PCCM consult pager ?After 7:00 pm call [place]   ? ?

## 2021-07-20 NOTE — Progress Notes (Signed)
Patient discontinued from mechanical ventilation per MD order and family wishes with RN and family at bedside.  ?

## 2021-07-20 NOTE — Death Summary Note (Signed)
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  Now complicated by pseudomonas and proteus pneumonia. Hx chronic severe anxiety.   PEG site wound POA Sacral wound POA HTN Hypothyroidism- resuming synthroid Worsening renal failure with hyperkalemia- not HD candidate DNR  Brief Hospital Course (including significant findings, care, treatment, and services provided and events leading to death)   56 year old woman who presented to [facility] ED 13-Jun-2022 with chest pain. PMHx significant for HTN, severe COPD (followed by Dr. Lamonte Sakai), chronic respiratory failure with tracheostomy in place, anxiety.   Prior to arrival, patient recently had change in ventilation from TV 480cc down to TV of 400cc. She has presented to the ED 3 times in the past 24 hours.  Discharged from the ED at 1500 after presenting with abdominal pain.  CT Chest/A/P negative for acute abnormality within the chest, abdomen, or pelvis. Presented again at 2200 with chest pain.   ED course notable for stable troponin (18 > 14), slightly elevated D-dimer, CTA Chest negative for PE but with concern for acute airway narrowing and opacification at the left mainstem bronchus, left lower lobar bronchus and lower subsegmental lower lobe airways.    She went into refractory respiratory failure with inability to ventilate without high ventilator settings and neuromuscular blockage.  She remained this way for multiple days and eventually went into renal failure.  In light of patient's terminal decline family allowed her to pass in peace off ventilator  and paralytics.  Pertinent Labs and Studies  Significant Diagnostic Studies DG Abd 1 View  Result Date: 06/14/2021 CLINICAL DATA:  56 year old female with history of ileus. Respiratory failure. EXAM: ABDOMEN - 1 VIEW COMPARISON:  Abdominal radiograph 06/12/2021. FINDINGS: Feeding tube is coiled with tip in the upper abdomen, likely in the mid body of the stomach. Visualized bowel gas pattern is nonobstructive, with small amount of gas noted in the transverse colon and paucity of small-bowel gas. No pathologic dilatation of small bowel or colon. No gross pneumoperitoneum noted on this single supine image. Surgical clips are noted in the epigastric region and right-side of the pelvis. Rectal thermistor noted. Bilateral pleural effusions are noted in the visualized lower thorax. IMPRESSION: 1. Support apparatus, as above. 2. Nonobstructive bowel gas pattern, not suggestive of ileus. Electronically Signed   By: Vinnie Langton M.D.   On: 06/14/2021 05:24   CT Angio Chest PE W/Cm &/Or Wo Cm  Result Date: 06/10/2021 CLINICAL DATA:  56 year old female with acute chest pain. EXAM: CT ANGIOGRAPHY CHEST WITH CONTRAST TECHNIQUE: Multidetector CT imaging of the chest was performed using the standard protocol during bolus administration of intravenous contrast. Multiplanar CT image reconstructions and MIPs were obtained to evaluate the vascular anatomy. RADIATION DOSE REDUCTION: This exam was performed according to the departmental dose-optimization program which includes automated exposure control, adjustment of the mA and/or kV according to patient size and/or use of iterative reconstruction technique. CONTRAST:  23mL OMNIPAQUE IOHEXOL 350 MG/ML SOLN COMPARISON:  CT Chest, Abdomen, and Pelvis 06/08/2021, and earlier. FINDINGS: Cardiovascular: Excellent contrast bolus timing in the pulmonary arterial tree. No focal filling defect identified in the pulmonary arteries  to suggest acute pulmonary embolism. Stable mild  tortuosity of the thoracic aorta. No cardiomegaly or pericardial effusion. Little contrast in the descending aorta. Calcified coronary artery atherosclerosis. Mediastinum/Nodes: Widespread calcified postinflammatory lymph nodes, chronically increased hilar nodal tissue. No interval change. Lungs/Pleura: Stable tracheostomy. Small volume retained secretions in the trachea around the tube. Distal trachea and carina remain patent although there is partial collapse of the left mainstem bronchus on series 10, image 63. And similar additional severe narrowing of the left lower lobe bronchus on series 10, image 80. And right lower lobe subsegmental and distal airways also appear newly opacified. Underlying chronic lung disease with advanced emphysema. Multiple peripheral partially calcified pulmonary nodules appears stable and postinflammatory. Increased bilateral lower lobe peribronchial streaky, which appears to represent a combination of atelectasis and inflammation. No pleural effusion. No pneumothorax. Upper Abdomen: Stable, negative visible upper abdomen. Musculoskeletal: No acute osseous abnormality identified. Review of the MIP images confirms the above findings. IMPRESSION: 1. No evidence of acute pulmonary embolus. 2. Pronounced chronic Emphysema (ICD10-J43.9) and chronic post granulomatous changes in the chest. Chronic tracheostomy. Trachea is patent but there is significant acute airway narrowing and opacification of the left mainstem bronchus, left lower lobar bronchus, and additional bilateral segmental lower lobe airways. And associated increased combined lower lobe atelectasis and inflammation. No pleural effusion. 3. Calcified coronary artery atherosclerosis. Aortic Atherosclerosis (ICD10-I70.0). Electronically Signed   By: Genevie Ann M.D.   On: 06/10/2021 04:36   US RENAL  Result Date: 06/21/2021 CLINICAL DATA:  Renal dysfunction EXAM: RENAL / URINARY TRACT ULTRASOUND COMPLETE COMPARISON:  02/01/2020  FINDINGS: Right Kidney: Renal measurements: 10.7 x 4.3 x 5.8 cm = volume: 137.5 mL. There is no hydronephrosis. There is increased cortical echogenicity. Left Kidney: Renal measurements: 8.5 x 4.5 x 4.4 cm = volume: 88.1 mL. There is no hydronephrosis. There is increased cortical echogenicity. Bladder: Foley catheter is seen in the bladder. Bladder is not distended limiting evaluation. Other: Small ascites is present. IMPRESSION: There is no hydronephrosis. There is increased cortical echogenicity in both kidneys suggesting medical renal disease. Small ascites. Electronically Signed   By: Elmer Picker M.D.   On: 06/21/2021 16:45   CT CHEST ABDOMEN PELVIS W CONTRAST  Result Date: 06/08/2021 CLINICAL DATA:  56 year old female with abdominal and pelvic distension and sepsis. EXAM: CT CHEST, ABDOMEN, AND PELVIS WITH CONTRAST TECHNIQUE: Multidetector CT imaging of the chest, abdomen and pelvis was performed following the standard protocol during bolus administration of intravenous contrast. RADIATION DOSE REDUCTION: This exam was performed according to the departmental dose-optimization program which includes automated exposure control, adjustment of the mA and/or kV according to patient size and/or use of iterative reconstruction technique. CONTRAST:  116mL OMNIPAQUE IOHEXOL 300 MG/ML  SOLN COMPARISON:  12/09/2020 chest CT, 11/27/2020 abdomen/pelvic CT and prior studies FINDINGS: CT CHEST FINDINGS Cardiovascular: Heart size is normal. Suggestion of LEFT ventricular hypertrophy identified. Coronary artery calcifications again identified. There is no evidence of thoracic aortic aneurysm or pericardial effusion. Mediastinum/Nodes: Calcified mediastinal and hilar lymph nodes are again noted compatible with prior granulomatous disease. No suspicious nodes are identified. No mediastinal mass noted. Tracheostomy tube is present. The visualized thyroid is unremarkable. Lungs/Pleura: Advanced emphysema again noted.  Dependent/basilar atelectasis/scarring again noted. Calcified granulomas subpleural nodules are unchanged. Bilateral LOWER lung cylindrical bronchiectasis again identified. There is no evidence of airspace disease for consolidation. Musculoskeletal: No acute or suspicious bony abnormalities are noted. CT ABDOMEN PELVIS FINDINGS Hepatobiliary: The liver and gallbladder are unremarkable. There is no evidence  of intrahepatic or extrahepatic biliary dilatation. Pancreas: Unremarkable Spleen: Unremarkable Adrenals/Urinary Tract: The kidneys are unchanged with bilateral renal cortical thinning. No acute renal abnormalities are identified. No significant adrenal or bladder abnormalities noted. Stomach/Bowel: Stomach is within normal limits. Appendix appears normal. No evidence of bowel obstruction, bowel wall thickening, distention, or inflammatory changes. Vascular/Lymphatic: Aortic atherosclerosis. No enlarged abdominal or pelvic lymph nodes. Reproductive: Uterus and bilateral adnexa are unremarkable. Other: Small amount of free pelvic fluid is again identified. No focal collection or pneumoperitoneum noted. Musculoskeletal: No acute or suspicious bony abnormalities are noted. IMPRESSION: 1. No evidence of new/acute abnormality within the chest, abdomen or pelvis. 2. Mild bilateral LOWER lung cylindrical bronchiectasis with mild bibasilar atelectasis/scarring. 3. Small amount of free pelvic fluid, unchanged. 4. Coronary artery disease. 5. Aortic Atherosclerosis (ICD10-I70.0) and Emphysema (ICD10-J43.9). Electronically Signed   By: Margarette Canada M.D.   On: 06/08/2021 21:52   DG Chest Port 1 View  Result Date: 06/22/2021 CLINICAL DATA:  Chest pain, tracheostomy tube present EXAM: PORTABLE CHEST 1 VIEW COMPARISON:  Chest radiograph dated June 21, 2021 FINDINGS: The heart size and mediastinal contours are within normal limits. Hyperinflated lungs with moderate emphysematous changes. Bibasilar fibrotic changes. Small  bilateral pleural effusions. No appreciable pneumothorax tracheostomy tube with distal tip approximately 5 cm above the carina. Feeding tube coursing below the diaphragm with distal tip not included. The visualized skeletal structures are unremarkable. IMPRESSION: 1.  Tracheostomy tube and feeding tubes are unchanged. 2. Moderate emphysema with bibasilar fibrotic changes and small bilateral pleural effusions. No appreciable pneumothorax. Electronically Signed   By: Keane Police D.O.   On: 06/22/2021 09:01   DG Chest Port 1 View  Result Date: 06/21/2021 CLINICAL DATA:  Acute respiratory failure. EXAM: PORTABLE CHEST 1 VIEW COMPARISON:  June 21, 2021 (9:03 p.m.) FINDINGS: There is stable tracheostomy tube and enteric tube positioning. The lungs are hyperinflated. Stable, diffuse emphysematous lung disease is seen with stable calcified granulomas noted within the right upper lobe. Mild atelectasis and/or early infiltrate is present within the left lung base. There is a small right pleural effusion. No pneumothorax is identified. The heart size and mediastinal contours are within normal limits. The visualized skeletal structures are unremarkable. IMPRESSION: 1. Stable, diffuse emphysematous lung disease. 2. Mild, stable left basilar atelectasis and/or early infiltrate. 3. Small right pleural effusion. Electronically Signed   By: Virgina Norfolk M.D.   On: 06/21/2021 22:49   DG Chest Port 1 View  Result Date: 06/21/2021 CLINICAL DATA:  Acute respiratory failure EXAM: PORTABLE CHEST 1 VIEW COMPARISON:  06/17/2021 FINDINGS: Tracheostomy tube is noted in satisfactory position. Feeding catheter extends into the stomach. Cardiac shadow is stable. Emphysematous changes are again seen and stable. Calcified granuloma in the right upper lobe is noted and stable. No bony abnormality is seen. No sizable effusion is noted. IMPRESSION: COPD without acute abnormality. Electronically Signed   By: Inez Catalina M.D.   On:  06/21/2021 21:10   DG CHEST PORT 1 VIEW  Result Date: 06/17/2021 CLINICAL DATA:  Hypoxia EXAM: PORTABLE CHEST 1 VIEW COMPARISON:  Previous studies including the examination of 06/15/2021 FINDINGS: Low position of diaphragms suggests COPD. Bullous emphysema is seen in the lower lung fields, more so on the right side. Small transverse linear density in the left parahilar region has not changed. There are no new focal infiltrates or signs of pulmonary edema. Tip of tracheostomy is proximally 6.4 cm above the carina. Enteric tube is noted traversing the esophagus. IMPRESSION: Severe COPD.  There are no new infiltrates or signs of pulmonary edema. Electronically Signed   By: Ernie Avena M.D.   On: 06/17/2021 10:33   DG CHEST PORT 1 VIEW  Result Date: 06/15/2021 CLINICAL DATA:  Respiratory distress. EXAM: PORTABLE CHEST 1 VIEW COMPARISON:  Chest radiograph dated 06/14/2021. FINDINGS: Tracheostomy above the carina in similar position. Enteric tube extends below the diaphragm with tip in the proximal stomach likely in the region of the gastric fundus. Background of emphysema. No new consolidation, pleural effusion, or pneumothorax. Small right upper lobe granuloma. The cardiac silhouette is within normal limits. No acute osseous pathology. IMPRESSION: 1. No acute cardiopulmonary process. 2. Emphysema. Electronically Signed   By: Elgie Collard M.D.   On: 06/15/2021 20:11   DG CHEST PORT 1 VIEW  Result Date: 06/14/2021 CLINICAL DATA:  Tracheostomy, ventilator, respiratory failure EXAM: PORTABLE CHEST 1 VIEW COMPARISON:  Portable exam 0551 hours compared to 06/13/2021 FINDINGS: Tracheostomy tube projects over tracheal air column. Feeding tube extends into stomach. Normal heart size, mediastinal contours, and pulmonary vascularity. Atherosclerotic calcification aorta. Emphysematous and bronchitic changes consistent with COPD. RIGHT basilar scarring. No pulmonary infiltrate, pleural effusion, or  pneumothorax. RIGHT apex calcifications unchanged consistent with granulomata as noted on a prior CT. Bones demineralized. IMPRESSION: COPD changes without acute infiltrate. Aortic Atherosclerosis (ICD10-I70.0) and Emphysema (ICD10-J43.9). Electronically Signed   By: Ulyses Southward M.D.   On: 06/14/2021 08:17   DG CHEST PORT 1 VIEW  Result Date: 06/13/2021 CLINICAL DATA:  Vent/trch, copd EXAM: PORTABLE CHEST - 1 VIEW COMPARISON:  the previous day's study FINDINGS: Tracheostomy stable. Feeding tube has been advanced at least as far as the stomach, tip not seen. Pulmonary hyperinflation with attenuated peripheral bronchovascular markings. Linear scarring or subsegmental atelectasis in the lower lungs as before. Calcified granuloma in the right upper lobe stable Heart size and mediastinal contours are within normal limits. Blunting of bilateral costophrenic angles.  No pneumothorax. Visualized bones unremarkable. IMPRESSION: Emphysema ([id number])  with chronic bibasilar scarring Feeding tube placement at least as far as the stomach, tip not seen. Electronically Signed   By: Corlis Leak M.D.   On: 06/13/2021 08:27   DG CHEST PORT 1 VIEW  Result Date: 06/12/2021 CLINICAL DATA:  Tracheostomy present.  COPD.  Respiratory failure. EXAM: PORTABLE CHEST 1 VIEW COMPARISON:  06/11/2021 FINDINGS: Tracheostomy device is present. Background changes of emphysema. Improved aeration left lung base. Chronic blunting of the costophrenic angles. Areas of scarring and calcified granulomas are again identified. No new consolidation. Stable cardiomediastinal contours. No pneumothorax. IMPRESSION: Improved aeration left lung base. Electronically Signed   By: Guadlupe Spanish M.D.   On: 06/12/2021 08:27   DG CHEST PORT 1 VIEW  Result Date: 06/11/2021 CLINICAL DATA:  Dyspnea EXAM: PORTABLE CHEST 1 VIEW COMPARISON:  06/11/2021 at 0505 hours FINDINGS: Tracheostomy tube remains in place. Stable cardiomediastinal contours. Advanced  emphysematous changes throughout both lungs. Similar appearance of increased interstitial markings, most pronounced within the left lung base. Chronic bibasilar pleural thickening and scarring. No large pleural fluid collection. No pneumothorax. Scattered calcified granulomas. IMPRESSION: Stable radiographic appearance of the chest with increased interstitial markings within the left lung base. Electronically Signed   By: Duanne Guess D.O.   On: 06/11/2021 11:57   DG CHEST PORT 1 VIEW  Result Date: 06/11/2021 CLINICAL DATA:  Shortness of breath, acute, respiratory failure EXAM: PORTABLE CHEST 1 VIEW COMPARISON:  Radiograph 06/10/2021 FINDINGS: Tracheostomy tube overlies the midthoracic trachea. Unchanged cardiomediastinal silhouette. Emphysema. Unchanged bibasilar pleural thickening  and lung scarring. Increased diffuse interstitial opacities. No pneumothorax. Bones are unchanged. IMPRESSION: Increased interstitial opacities, likely interstitial edema. Electronically Signed   By: Maurine Simmering M.D.   On: 06/11/2021 08:16   DG CHEST PORT 1 VIEW  Result Date: 06/10/2021 CLINICAL DATA:  Shortness of breath. EXAM: PORTABLE CHEST 1 VIEW COMPARISON:  Chest x-rays dated 05/22/2021 and 06/08/2021. Chest CT dated 06/10/2021. FINDINGS: Heart size and mediastinal contours are stable. Tracheostomy tube appears appropriately positioned in the midline. Chronic scarring and emphysematous changes appear stable. No new lung findings seen. No pleural effusion or pneumothorax is seen. IMPRESSION: 1. No active disease. No evidence of pneumonia or pulmonary edema. 2. Stable chronic scarring and emphysematous changes. 3. Tracheostomy tube appears appropriately positioned in the midline. Electronically Signed   By: Franki Cabot M.D.   On: 06/10/2021 21:16   DG Chest Port 1 View  Result Date: 06/10/2021 CLINICAL DATA:  Chest pain. Pain onset after being discharged from the emergency room today. EXAM: PORTABLE CHEST 1 VIEW  COMPARISON:  Radiograph and CT yesterday. FINDINGS: Tracheostomy tube tip at the thoracic inlet. Emphysema. There is increased hyperinflation from prior exam. Basilar bullous disease again seen. No pneumothorax, pulmonary edema, or acute airspace disease. Calcified granuloma again seen in the right upper lung. IMPRESSION: Chronic but increased hyperinflation since yesterday. No other new findings. Advanced emphysema. Electronically Signed   By: Keith Rake M.D.   On: 05/26/2021 23:53   DG Chest Portable 1 View  Result Date: 06/08/2021 CLINICAL DATA:  Shortness of breath. EXAM: PORTABLE CHEST 1 VIEW COMPARISON:  Radiograph and CT 12/09/2020 FINDINGS: Tracheostomy tube tip projects over the thoracic inlet. Chronic emphysema with bullous disease at the right lung base. Slight increase in interstitial thickening. Bandlike atelectasis or scarring in the right mid lung zone and left lower lobe. Calcified mediastinal and hilar adenopathy with right upper lobe calcified granulomas. The heart is normal in size with stable mediastinal contours. Multiple EKG leads overlie the chest. No pneumothorax or large pleural effusion. IMPRESSION: 1. Slight increase in interstitial thickening may be pulmonary edema or atypical infection. 2. Chronic emphysema with bullous disease at the right lung base. Bandlike atelectasis or scarring in the right mid lung zone and left lower lobe. Electronically Signed   By: Keith Rake M.D.   On: 06/08/2021 19:02   DG Abd Portable 1V  Result Date: 06/19/2021 CLINICAL DATA:  56 year old female feeding tube placement. EXAM: PORTABLE ABDOMEN - 1 VIEW COMPARISON:  06/18/2021 and earlier. FINDINGS: Portable AP supine view at 1011 hours. NG type tube has been removed and enteric feeding tube now terminates in the body of the stomach in the left upper quadrant. Stable lung bases. Paucity of bowel gas. Right femoral vascular catheter redemonstrated. No acute osseous abnormality identified.  IMPRESSION: Enteric feeding tube terminates in the body of the stomach. Advance 10-12 cm to allow enough slack if post pyloric transit is desired. Electronically Signed   By: Genevie Ann M.D.   On: 06/19/2021 10:24   DG Abd Portable 1V  Result Date: 06/18/2021 CLINICAL DATA:  NG tube placement EXAM: PORTABLE ABDOMEN - 1 VIEW COMPARISON:  06/15/2021 FINDINGS: Nasogastric tube with the tip projecting over the stomach, but the proximal port is at the esophagogastric junction. Recommend advancing the nasogastric tube 10 cm. No bowel dilatation to suggest obstruction. No evidence of pneumoperitoneum, portal venous gas or pneumatosis. No pathologic calcifications along the expected course of the ureters. No acute osseous abnormality. IMPRESSION: Nasogastric tube with the  tip projecting over the stomach, but the proximal port is at the esophagogastric junction. Recommend advancing the nasogastric tube 10 cm. Electronically Signed   By: Kathreen Devoid M.D.   On: 06/18/2021 10:19   DG Abd Portable 1V  Result Date: 06/15/2021 CLINICAL DATA:  Nasogastric tube placement. EXAM: PORTABLE ABDOMEN - 1 VIEW COMPARISON:  06/14/2021 FINDINGS: Previously seen feeding tube is no longer seen. A nasogastric tube is now visualized with the distal tip curved back on itself overlying the gastric fundus. Bibasilar pulmonary emphysema and scarring again noted. IMPRESSION: Nasogastric tube with distal tip curved back on itself overlying the gastric fundus. Electronically Signed   By: Marlaine Hind M.D.   On: 06/15/2021 10:35   DG Abd Portable 1V  Result Date: 06/12/2021 CLINICAL DATA:  Feeding tube placement EXAM: PORTABLE ABDOMEN - 1 VIEW COMPARISON:  12/25/2020 FINDINGS: Feeding tube enters the stomach with the tip in the proximal body of the stomach. Normal bowel gas pattern. Right femoral central venous catheter tip in the IVC overlying the L5 region. Probable Foley catheter in the bladder. Surgical clip in the pelvis. IMPRESSION:  Feeding tube coiled in the stomach with the tip in the proximal body of the stomach. Electronically Signed   By: Franchot Gallo M.D.   On: 06/12/2021 12:01   ECHOCARDIOGRAM LIMITED  Result Date: 06/11/2021    ECHOCARDIOGRAM REPORT   Patient Name:   GAONOU HELING Date of Exam: 06/11/2021 Medical Rec #:  [id number]            Height:       65.0 in Accession #:    [id number]           Weight:       113.1 lb Date of Birth:  01-09-1966             BSA:          1.553 m Patient Age:    56 years             BP:           125/84 mmHg Patient Gender: F                    HR:           88 bpm. Exam Location:  Inpatient Procedure: 2D Echo, Limited Echo, Color Doppler and Cardiac Doppler Indications:    Hypoxemia  History:        Patient has prior history of Echocardiogram examinations. COPD;                 Risk Factors:Hypertension and Dyslipidemia.  Sonographer:    Jyl Heinz Referring Phys: [id number] CHI JANE ELLISON IMPRESSIONS  1. Left ventricular ejection fraction, by estimation, is >75%. The left ventricle has hyperdynamic function. The left ventricle has no regional wall motion abnormalities. Left ventricular diastolic function could not be evaluated.  2. Right ventricular systolic function is normal. The right ventricular size is normal. Moderately increased right ventricular wall thickness. Tricuspid regurgitation signal is inadequate for assessing PA pressure.  3. A small pericardial effusion is present. The pericardial effusion is localized near the right ventricle.  4. The mitral valve is normal in structure. Mild to moderate mitral valve regurgitation.  5. The aortic valve is tricuspid. There is mild thickening of the aortic valve. Aortic valve regurgitation is not visualized. Aortic valve sclerosis is present, with no evidence of aortic valve stenosis.  6. The inferior vena cava is dilated in  size with <50% respiratory variability, suggesting right atrial pressure of 15 mmHg.  Conclusion(s)/Recommendation(s): No apical views. Limited parasternal views. Most of the study is interpreted based on subcostal and off-axis low parasternal views. FINDINGS  Left Ventricle: Left ventricular ejection fraction, by estimation, is >75%. The left ventricle has hyperdynamic function. The left ventricle has no regional wall motion abnormalities. The left ventricular internal cavity size was small. There is no left  ventricular hypertrophy. Left ventricular diastolic function could not be evaluated. Right Ventricle: The right ventricular size is normal. Moderately increased right ventricular wall thickness. Right ventricular systolic function is normal. Tricuspid regurgitation signal is inadequate for assessing PA pressure. Left Atrium: Left atrial size was not well visualized. Right Atrium: Right atrial size was not well visualized. Pericardium: A small pericardial effusion is present. The pericardial effusion is localized near the right ventricle. Mitral Valve: The mitral valve is normal in structure. Mild to moderate mitral valve regurgitation, with centrally-directed jet. Tricuspid Valve: The tricuspid valve is grossly normal. Tricuspid valve regurgitation is not demonstrated. Aortic Valve: The aortic valve is tricuspid. There is mild thickening of the aortic valve. Aortic valve regurgitation is not visualized. Aortic valve sclerosis is present, with no evidence of aortic valve stenosis. Pulmonic Valve: The pulmonic valve was grossly normal. Pulmonic valve regurgitation is not visualized. Aorta: The aortic root was not well visualized. Venous: The inferior vena cava is dilated in size with less than 50% respiratory variability, suggesting right atrial pressure of 15 mmHg. IAS/Shunts: The interatrial septum was not well visualized.  LEFT VENTRICLE PLAX 2D LVIDd:         3.47 cm LVIDs:         1.65 cm LV PW:         0.97 cm LV IVS:        0.87 cm LVOT diam:     2.00 cm LVOT Area:     3.14 cm  LEFT  ATRIUM         Index LA diam:    2.70 cm 1.74 cm/m   AORTA Ao Root diam: 2.70 cm  SHUNTS Systemic Diam: 2.00 cm Dani Gobble Croitoru MD Electronically signed by Sanda Klein MD Signature Date/Time: 06/11/2021/3:24:05 PM    Final     Microbiology No results found for this or any previous visit (from the past 240 hour(s)).  Lab Basic Metabolic Panel: Recent Labs  Lab 06/20/21 0344 06/21/21 0442 06/22/21 0452 06/22/21 0950 06/23/21 0427 06/23/21 0748 06/23/21 1521  NA 140 132* 131* 135 135  --   --   K 3.5 4.5 5.8* 4.9 5.9* 5.8* 6.1*  CL 96* 95* 95* 96* 94*  --   --   CO2 32 27 23 24 27   --   --   GLUCOSE 191* 185* 216* 167* 164*  --   --   BUN 65* 93* 111* 117* 122*  --   --   CREATININE 1.81* 2.25* 2.84* 3.18* 3.34*  --   --   CALCIUM 9.1 8.5* 8.6* 8.5* 8.1*  --   --   MG 2.7* 2.7* 2.7*  --  2.6*  --   --   PHOS 3.2  --  5.3*  --  8.4*  --   --    Liver Function Tests: Recent Labs  Lab 06/22/21 0452 06/23/21 0427  AST 29 32  ALT 32 38  ALKPHOS 89 83  BILITOT 0.2* 0.5  PROT 6.9 6.9  ALBUMIN 3.0* 2.9*   No results for input(s):  LIPASE, AMYLASE in the last 168 hours. No results for input(s): AMMONIA in the last 168 hours. CBC: Recent Labs  Lab 06/20/21 0344 06/21/21 0442 06/22/21 0452 06/23/21 0427  WBC 6.8 6.6 8.9 13.7*  HGB 8.7* 8.6* 9.1* 8.8*  HCT 28.5* 28.5* 30.1* 28.2*  MCV 91.1 91.1 88.8 89.8  PLT 198 189 204 181   Cardiac Enzymes: No results for input(s): CKTOTAL, CKMB, CKMBINDEX, TROPONINI in the last 168 hours. Sepsis Labs: Recent Labs  Lab 06/20/21 0344 06/21/21 0442 06/22/21 0452 06/23/21 0427  WBC 6.8 6.6 8.9 13.7Candee Furbish 06/26/2021, 6:53 PM

## 2021-07-20 NOTE — Progress Notes (Signed)
? ? ? ? ?NAME:  Kathleen Cameron, MRN:  [id number], DOB:  1965-11-14, LOS: 57 ?ADMISSION DATE:  06/15/2021, CONSULTATION DATE:  06/10/2021 ?REFERRING MD:  Ralene Bathe - EDP CHIEF COMPLAINT: Chest pain ? ?History of Present Illness:  ?56 year old woman who presented to [facility] ED 2/19 with chest pain. PMHx significant for HTN, severe COPD (followed by Dr. Lamonte Sakai), chronic respiratory failure with tracheostomy in place, anxiety. ? ?Prior to arrival, patient recently had change in ventilation from TV 480cc down to TV of 400cc. She has presented to the ED 3 times in the past 24 hours.  Discharged from the ED at 1500 after presenting with abdominal pain.  CT Chest/A/P negative for acute abnormality within the chest, abdomen, or pelvis. Presented again at 2200 with chest pain. ? ?ED course notable for stable troponin (18 > 14), slightly elevated D-dimer, CTA Chest negative for PE but with concern for acute airway narrowing and opacification at the left mainstem bronchus, left lower lobar bronchus and lower subsegmental lower lobe airways.  ? ?PCCM was consulted for admission.   ? ?Pertinent Medical History:  ?Severe COPD, Chronic respiratory failure with tracheostomy in place, hypertension, anxiety ? ?Significant Hospital Events: ?Including procedures, antibiotic start and stop dates in addition to other pertinent events   ?2/19-20: presented to ED x3 times. Abd pain: CT chest/abd/pelvis negative for acute process. CT chest: concern for L airway narrowing.  ?2/21 restless and anxious on vent overnight, received one dose paralytic and fentanyl.  Bronch demonstrating dynamic collapse of bilateral mainstem. ?2/22 Improved vent mechanics, remains on Propofol/Fentanyl; Cortrak placement for nutritional support. ?2/23 Vent dyssynchrony/breath stacking with increased pressures on vent early AM. Resolved with additional dose roc, sedation adjustment. ?2/24 Ongoing vent dyssynchrony throughout the last 24 hours requiring 6 PRN doses of roc.   ?2/25 Patient on minimal vent settings and clinical improvement with pneumonia. Attempted to wean off sedation however had increased peak pressures due to EDAC and bronchospasm. Required vec 4PM and 7PM. ?2/28 AM required vec x2 ?3/1 switched from precedex to propofol ?3/3 no improvement intolerant of any decrease in sedation with ventilator noncompliance/dyssynchrony family gathering for goals of care discussion ? ?Interim History / Subjective:  ?Remains sedated/ paralyzed ?Frequent desaturations this morning ?Possibly transition to comfort care today  ? ?Objective:  ?Blood pressure 129/71, pulse 86, temperature (!) 97.3 ?F (36.3 ?C), resp. rate (!) 24, height 5\' 5"  (1.651 m), weight 61 kg, SpO2 92 %. ?   ?Vent Mode: PRVC ?FiO2 (%):  [40 %-60 %] 60 % ?Set Rate:  [24 bmp] 24 bmp ?Vt Set:  [450 mL] 450 mL ?PEEP:  [14 cmH20] 14 cmH20 ?Plateau Pressure:  [27 cmH20-30 cmH20] 30 cmH20  ? ?Intake/Output Summary (Last 24 hours) at 07/21/2021 0828 ?Last data filed at 06/23/2021 2000 ?Gross per 24 hour  ?Intake 1353.1 ml  ?Output 785 ml  ?Net 568.1 ml  ? ?Filed Weights  ? 06/22/21 0500 06/23/21 0433 07-21-21 0500  ?Weight: 61.6 kg 61.4 kg 61 kg  ? ? ?Fent 150, propofol 50, nimbex gtt  ?General: critically ill older female sedated/ paralyzed on MV ?HEENT: MM pink/moist, poor dentition, midline shiley cuffed 8 trach, cortrak, scleral edema/ exophthalmos  ?Neuro: sedated/ paralyzed  ?CV: rr, NSR ?PULM:  MV supported breaths, coarse throughout, right wheeze, mod amount dark brown/tan secretions ?GI: taut, +bs, foley  ?Extremities: warm/dry, severe anasarca, R radial Aline  ? ?No labs today ? ?Resolved Hospital Problem List:  ?Abdominal pain - CT A/P negative for acute process.  History of PEG tube.  Lipase normal. ?Chest pain ?Hyperkalemia  ?Septic shock vs sedation related hypotension ? ?Assessment & Plan:  ?Acute on chronic hypoxemic and hypercarbic respiratory failure in patient with vent-dependent end stage COPD at baseline.   Now complicated by pseudomonas and proteus pneumonia. ?Hx chronic severe anxiety.   ?PEG site wound POA ?Sacral wound POA ?HTN ?Hypothyroidism- resuming synthroid ?Worsening renal failure with hyperkalemia- not HD candidate ?DNR ?Continue current care for now.  Awaiting family's timing on transitioning to comfort care.  DNR.  No further escalation of care as would be medically futile, including no vasopressors.  Will need to stop nimbex and return of TOF 4/4 prior to transition to comfort care and will need increased sedation for comfort once NMB stopped.   ? ? ?Best Practice (right click and "Reselect all SmartList Selections" daily)  ? ?Diet/type: tubefeeds ?DVT prophylaxis: prophylactic heparin  ?GI prophylaxis: PPI, ?Lines: Arterial Line and yes and it is still needed,  ?Foley:  Yes, and it is still needed, comfort ?Code Status:  DNR ?Last date of multidisciplinary goals of care: pending ? ?Critical care time: 30 min  ? ? ?Kennieth Rad, ACNP ?[facility] ?07/22/21, 8:28 AM ? ?See Amion for pager ?If no response to pager, please call PCCM consult pager ?After 7:00 pm call Elink   ? ? ? ?

## 2021-07-20 DEATH — deceased

## 2021-07-31 NOTE — Telephone Encounter (Signed)
Patient is now deceased. Will close encounter.  ?

## 2021-07-31 NOTE — Telephone Encounter (Signed)
Patient passed away on 2021/07/04. Will close encounter.  ? ?

## 2021-08-09 ENCOUNTER — Other Ambulatory Visit: Payer: Self-pay | Admitting: Emergency Medicine

## 2021-08-09 DIAGNOSIS — R1010 Upper abdominal pain, unspecified: Secondary | ICD-10-CM

## 2021-08-22 IMAGING — DX DG CHEST 1V PORT
1 series · 1 of 1 positions shown · non-contrast
Comparison: 04/13/2019

CLINICAL DATA: Shortness of breath for several hours

EXAM:
PORTABLE CHEST 1 VIEW

[chest ap]
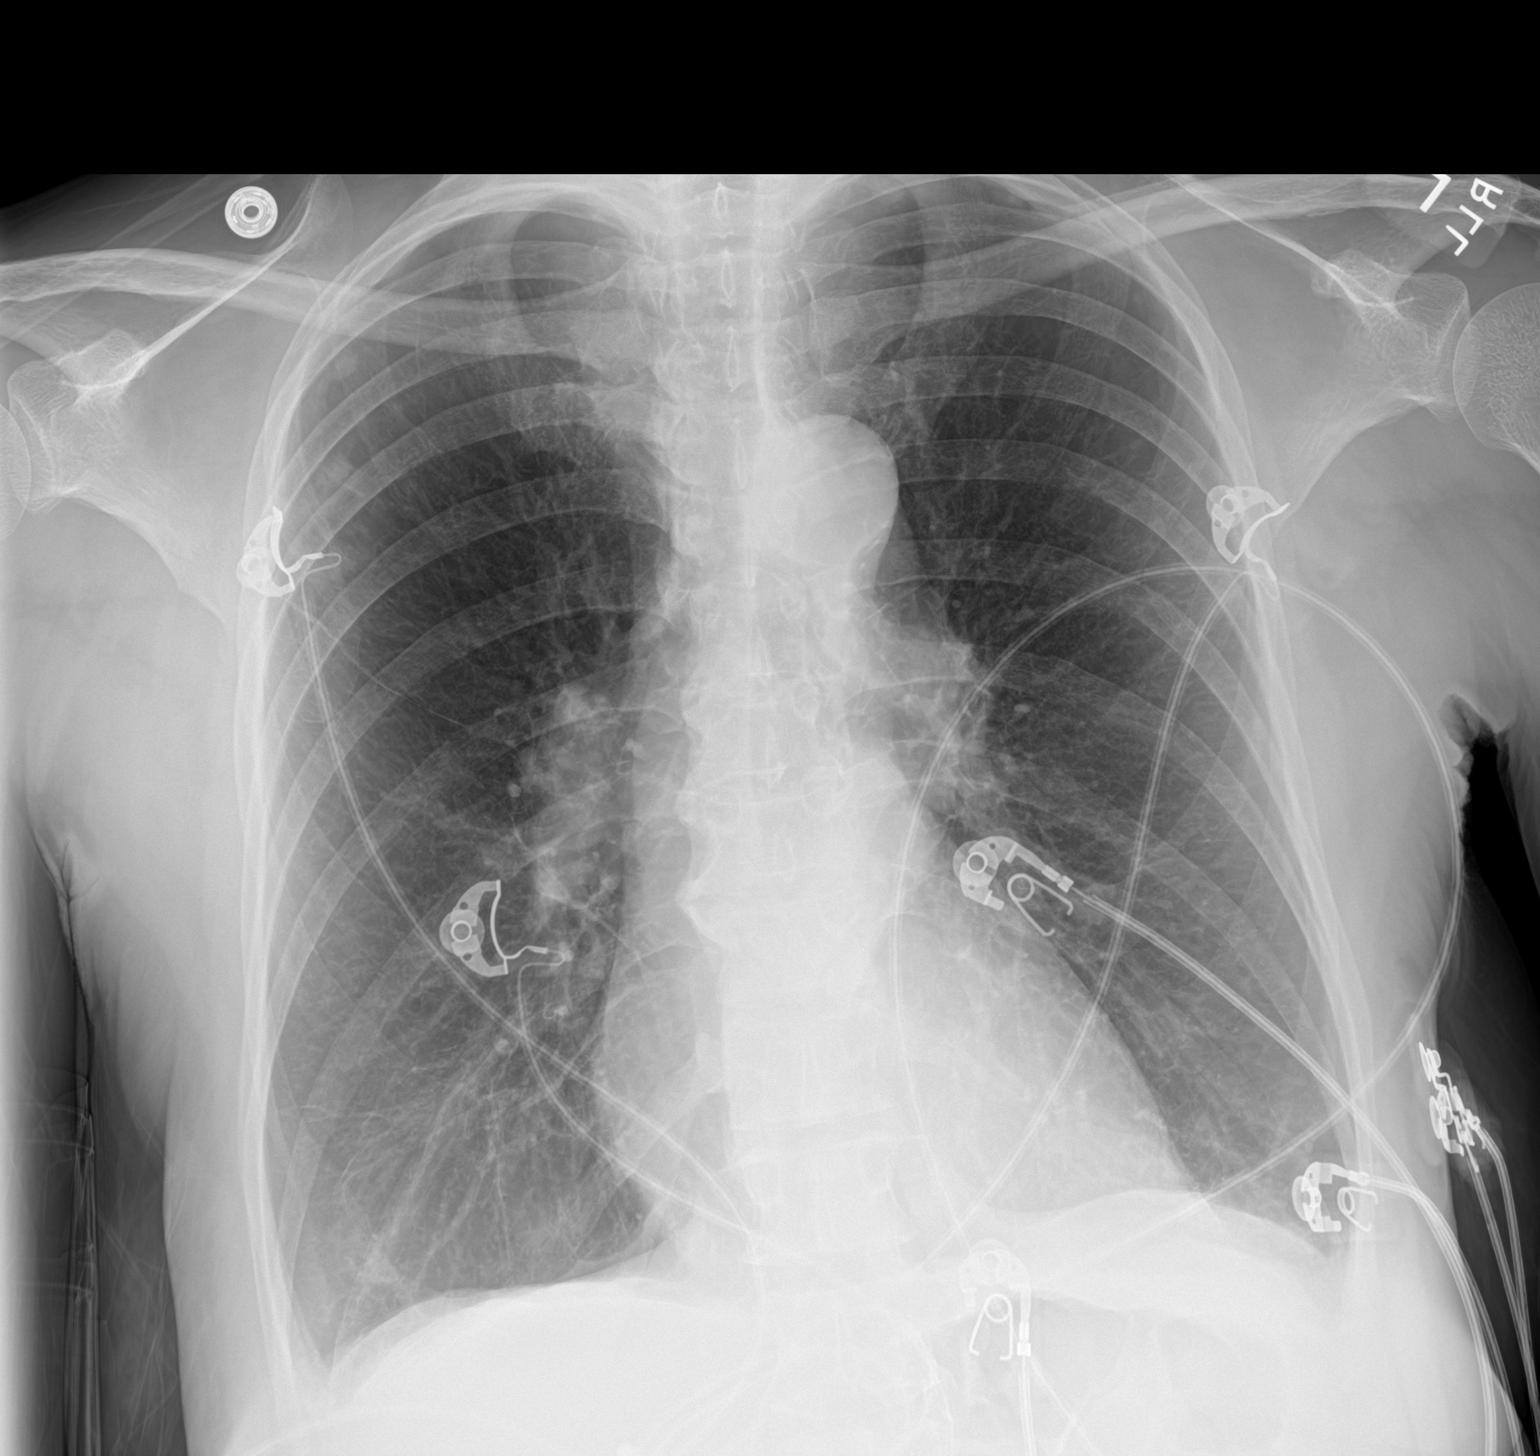

[1 of 1 positions shown; findings below may reference images not displayed]

FINDINGS: Cardiac shadows within normal limits. Mild aortic calcifications are
seen. Lungs are hyperinflated bilaterally. Stable nodular opacities
are seen in the right upper lobe. These showed some calcification on
prior CT examination consistent with prior granulomatous disease.
Degenerative changes of the thoracic spine are noted.
IMPRESSION: Changes of prior granulomatous disease.

No acute abnormality noted.

## 2021-12-05 IMAGING — DX DG CHEST 1V PORT
1 series · 1 of 1 positions shown · non-contrast
Comparison: Chest radiograph 11/02/2019

CLINICAL DATA: SOB

EXAM:
PORTABLE CHEST 1 VIEW

[chest ap]
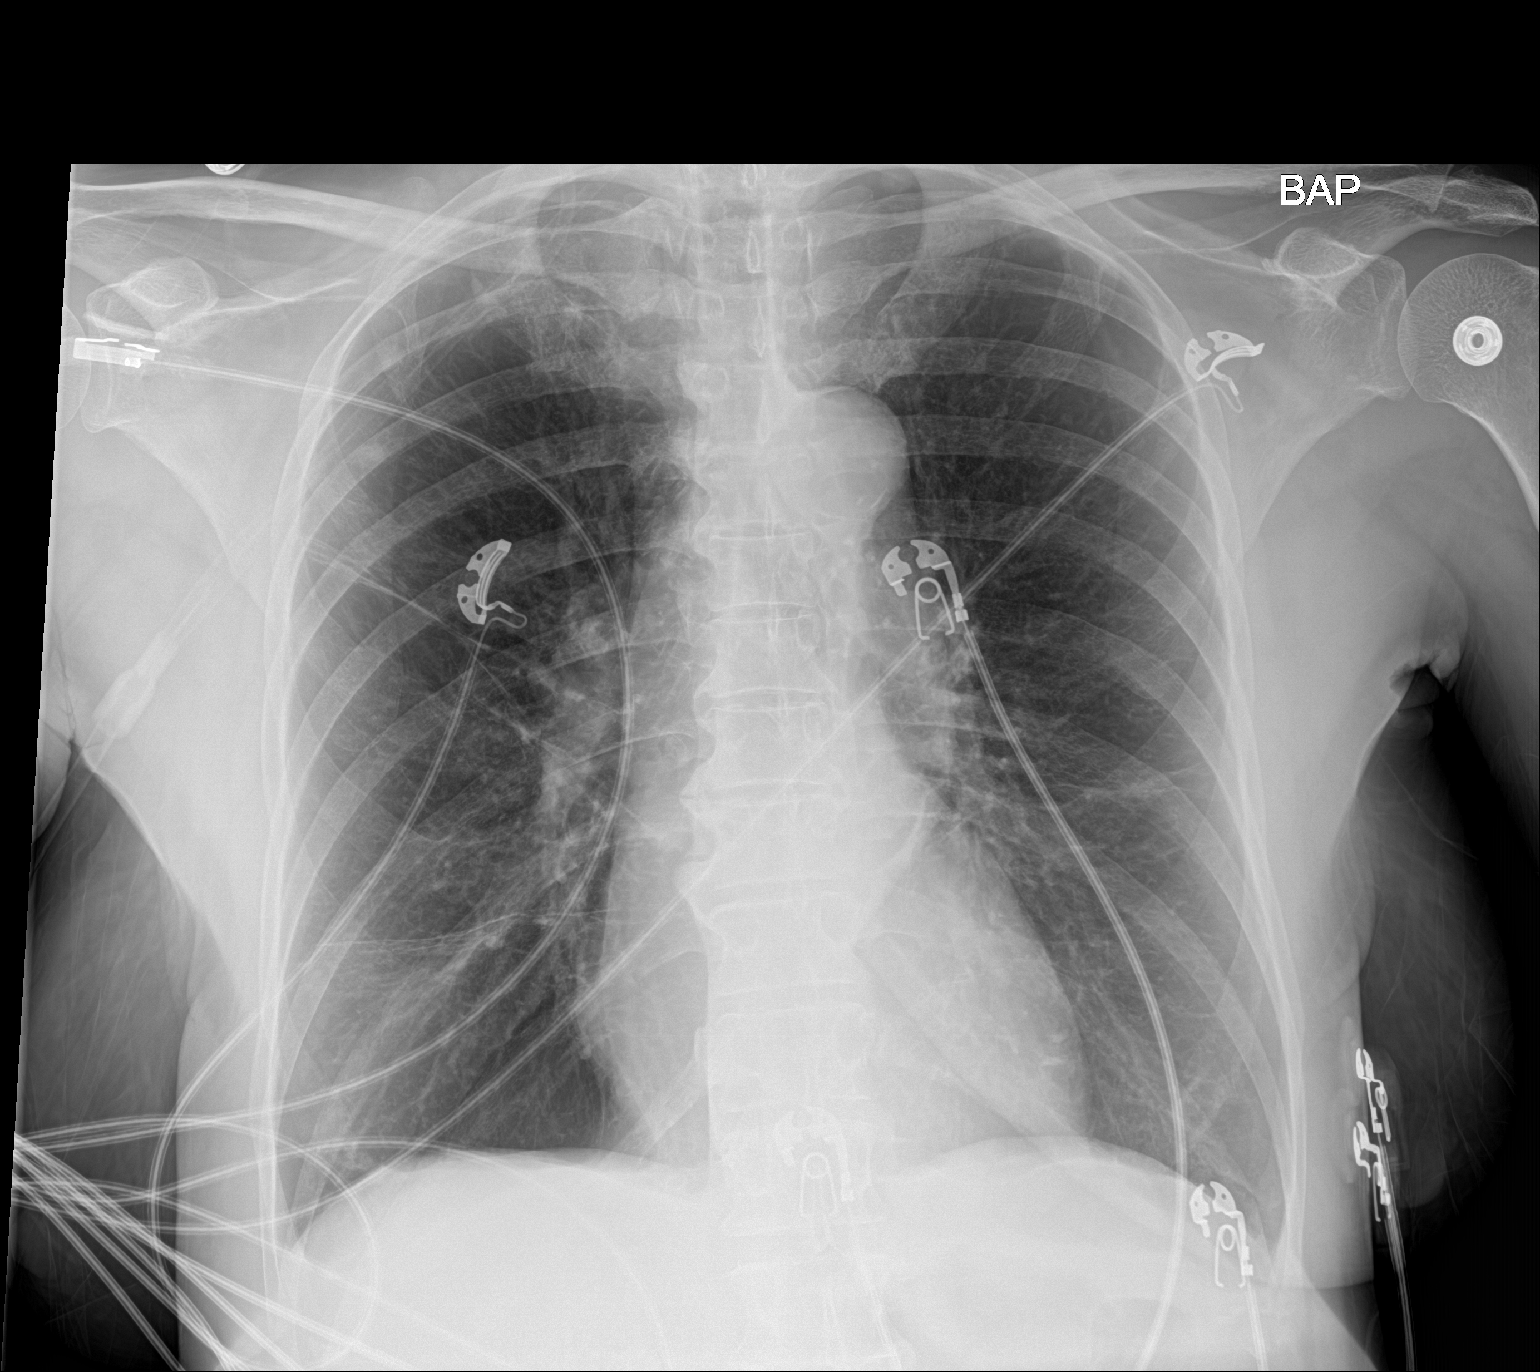

[1 of 1 positions shown; findings below may reference images not displayed]

FINDINGS: The heart size and mediastinal contours are within normal limits.
Lungs are hyperinflated. Mild streaky opacities in the bilateral mid
to lower lungs. Unchanged small nodules in the right upper lung. No
new focal consolidation. No pneumothorax or large pleural effusion.
No acute finding in the visualized skeleton.
IMPRESSION: Mild streaky opacities in the bilateral mid to lower lungs favored
to represent atelectasis, similar to prior.

## 2021-12-09 IMAGING — DX DG CHEST 1V PORT
1 series · 1 of 1 positions shown · non-contrast
Comparison: 11/05/2019, 04/13/2019

CLINICAL DATA: Dyspnea, asthma

EXAM:
PORTABLE CHEST 1 VIEW

[chest]
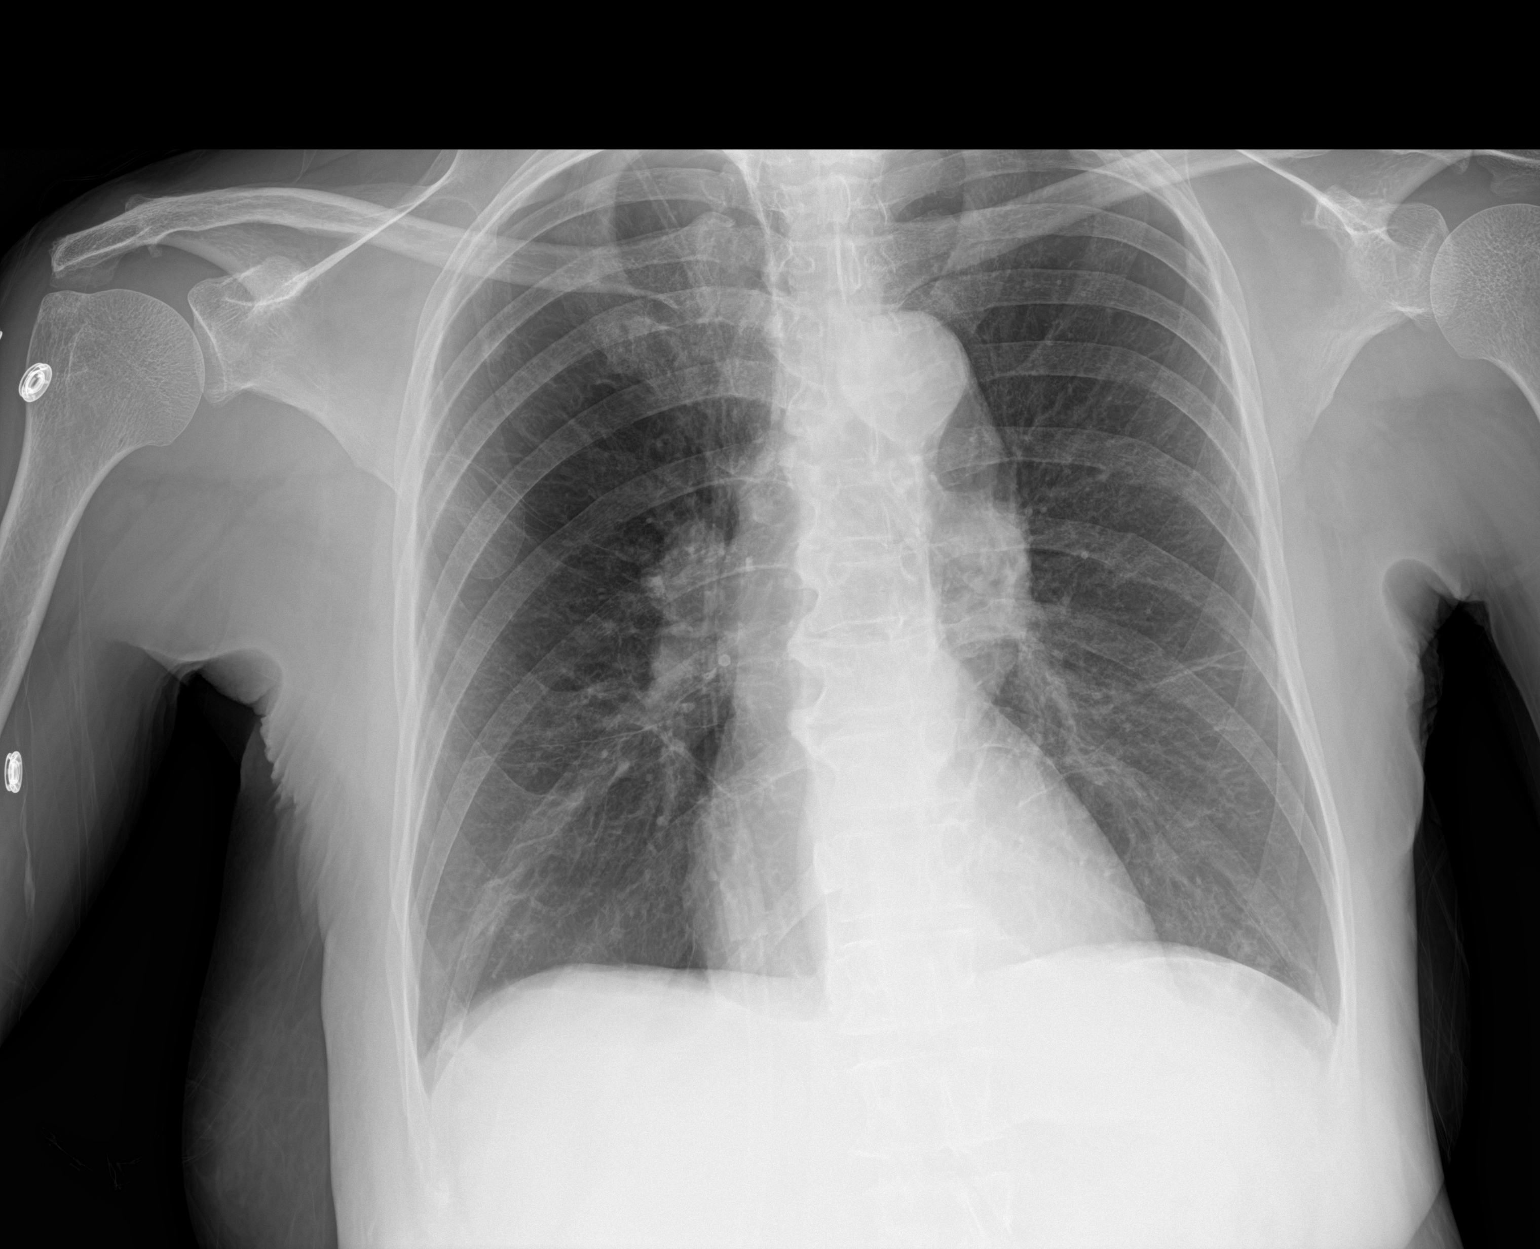

[1 of 1 positions shown; findings below may reference images not displayed]

FINDINGS: Minimal left basilar atelectasis. Lungs remain stably, symmetrically
mildly hyperinflated in keeping with changes of underlying COPD. No
superimposed focal pulmonary infiltrate. No pneumothorax or pleural
effusion. Cardiac size is within normal limits. Prominence of the
hila bilaterally stable since multiple prior examinations and simply
represents a combination of vascular shadow and calcified hilar
adenopathy related to old granulomatous disease. The thoracic aorta
is tortuous, but otherwise unremarkable. No acute bone abnormality.
IMPRESSION: Stable pulmonary hyperinflation in keeping with underlying COPD.

## 2022-02-27 IMAGING — DX DG CHEST 1V PORT
1 series · 1 of 1 positions shown · non-contrast
Comparison: Chest radiograph November 09, 2019

CLINICAL DATA: Altered mental status

EXAM:
PORTABLE CHEST 1 VIEW

[chest]
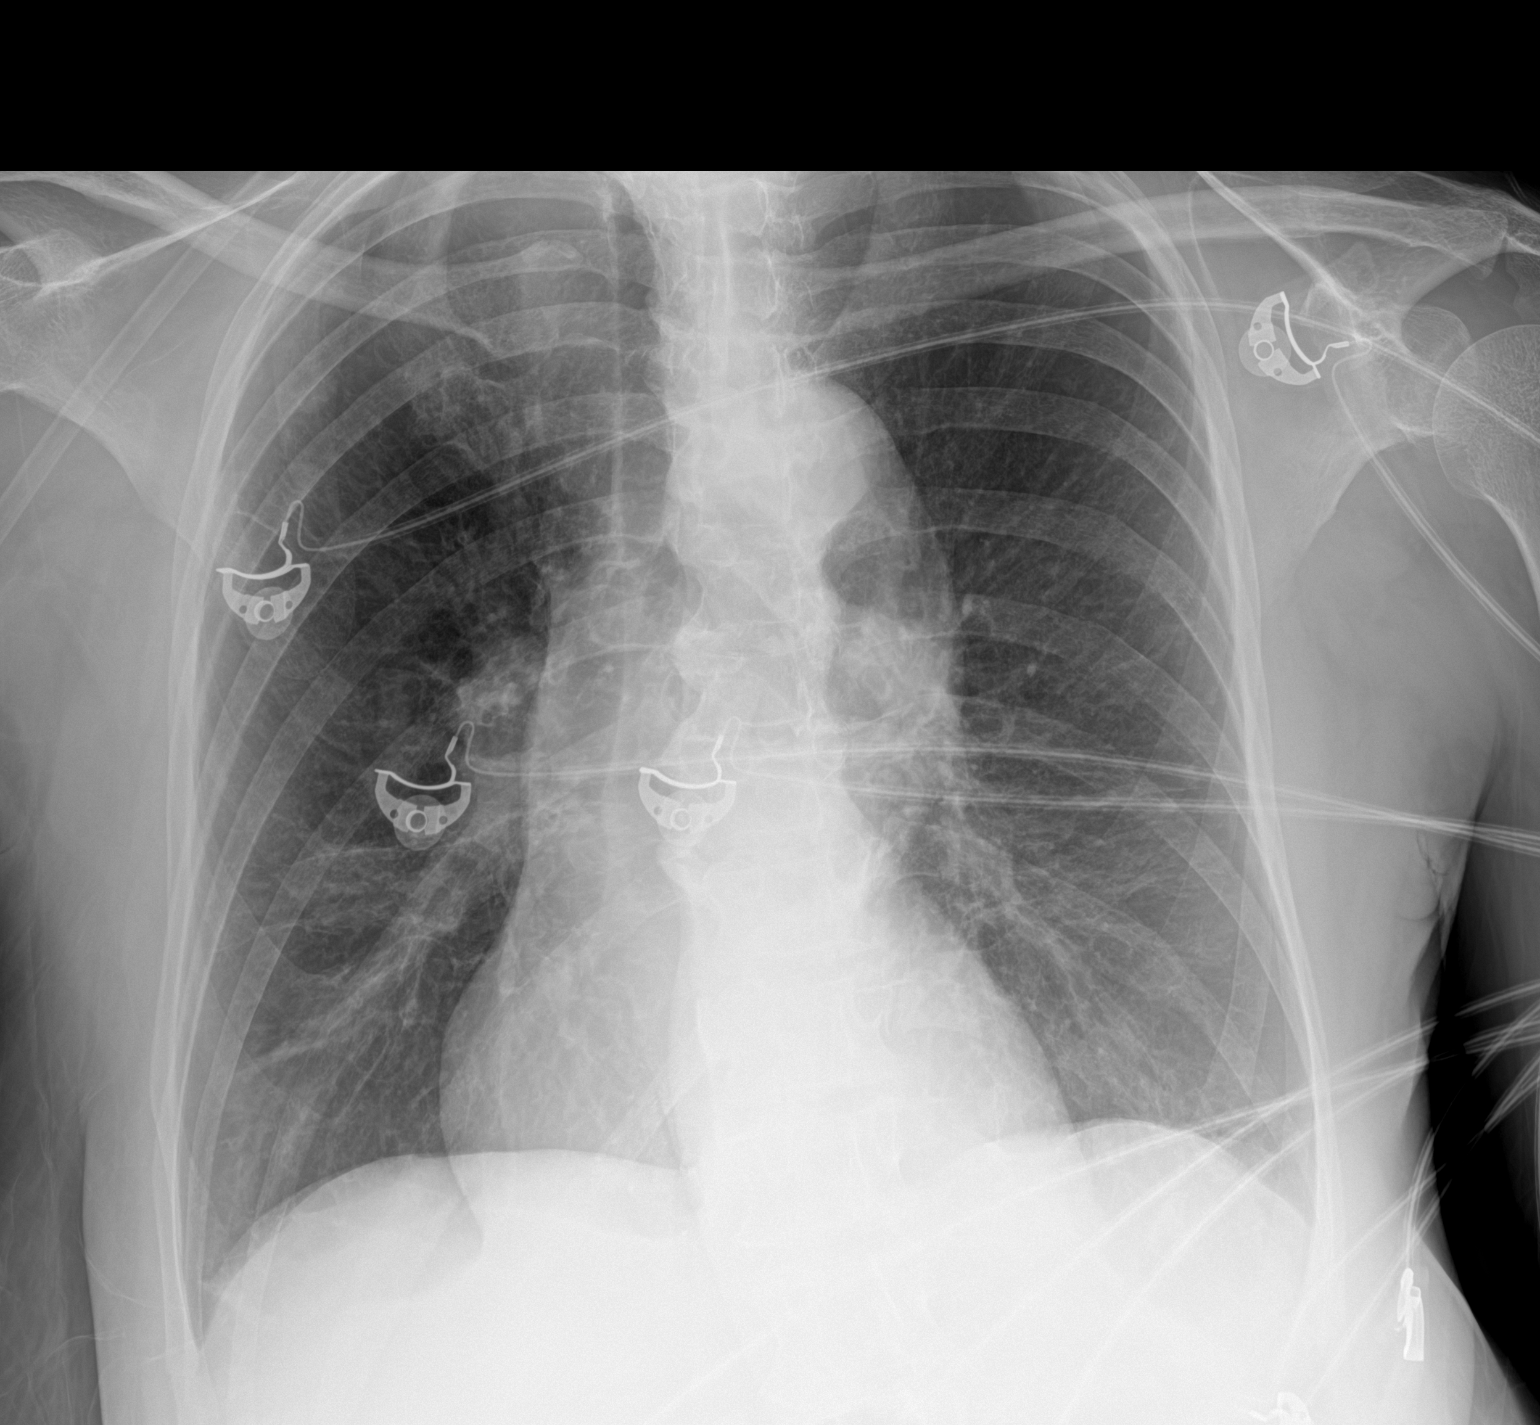

[1 of 1 positions shown; findings below may reference images not displayed]

FINDINGS: Multiple monitoring leads overlie the patient. Stable cardiac and
mediastinal contours. Tortuosity of the thoracic aorta. No large
area pulmonary consolidation. No pleural effusion or pneumothorax.
Thoracic spine degenerative changes.
IMPRESSION: No active disease.

## 2022-03-03 IMAGING — DX DG ABDOMEN 1V
1 series · 1 of 1 positions shown · non-contrast
Comparison: None.

CLINICAL DATA: Abdominal pain.

EXAM:
ABDOMEN - 1 VIEW

[abdomen kub]
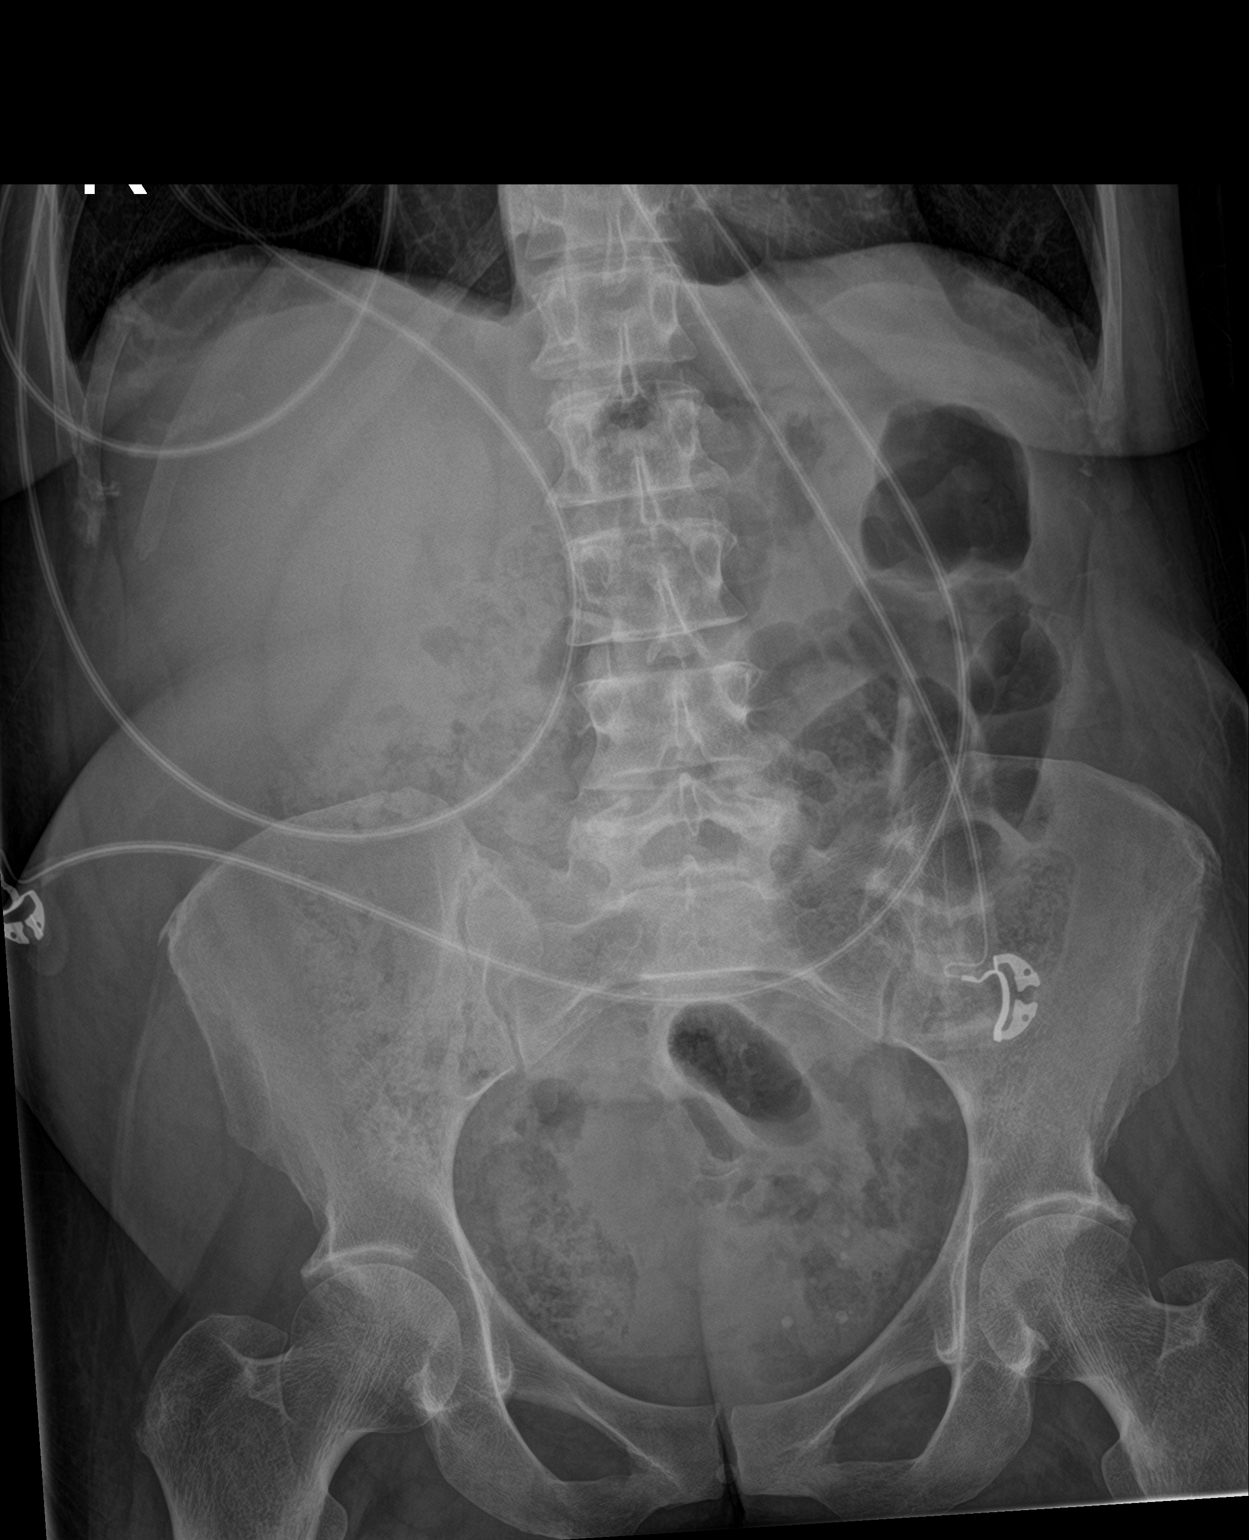

[1 of 1 positions shown; findings below may reference images not displayed]

FINDINGS: No abnormal bowel dilatation is noted. Moderate amount of stool is
seen throughout the colon. Phleboliths are noted in the pelvis.
IMPRESSION: Moderate stool burden. No evidence of bowel obstruction or ileus.

## 2022-03-03 IMAGING — US US ABDOMEN COMPLETE
1 series · 14 of 25 positions shown · non-contrast
Comparison: None.

CLINICAL DATA: Right upper quadrant pain for several hours

EXAM:
ABDOMEN ULTRASOUND COMPLETE

[Series 1: us abdomen complete · 14 of 80 slices shown]
[im 1/80]
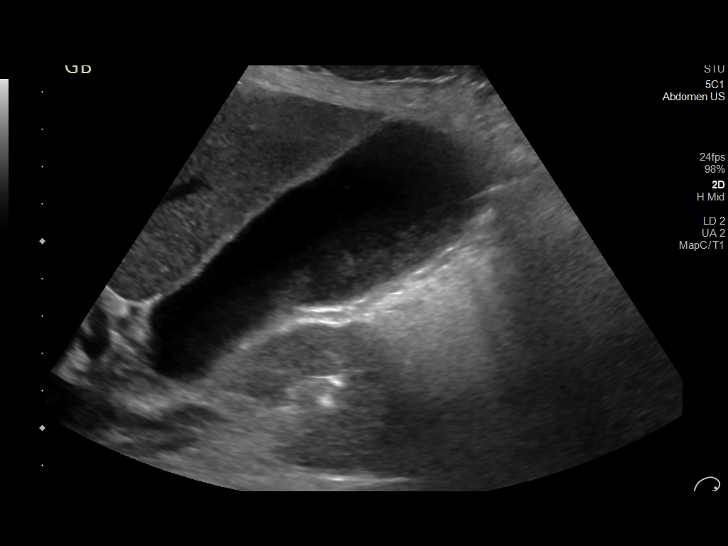
[im 7/80]
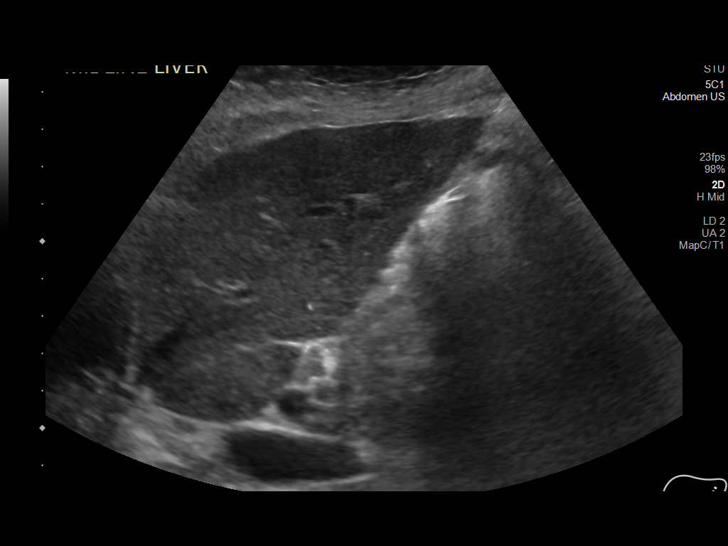
[im 14/80]
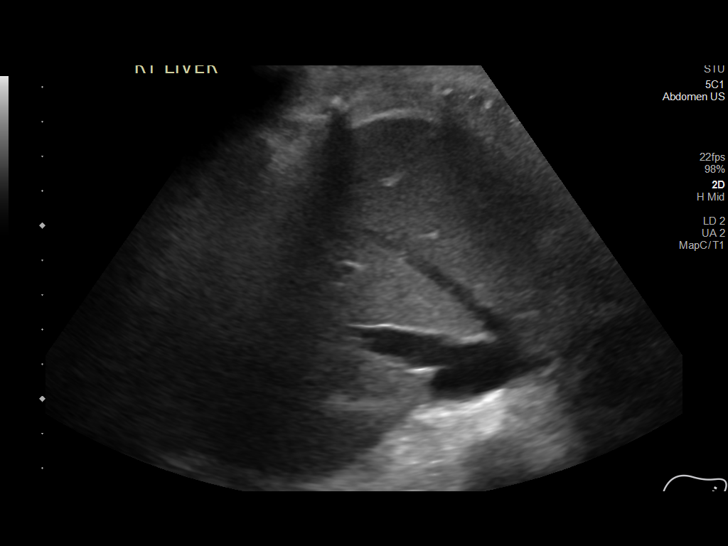
[im 20/80]
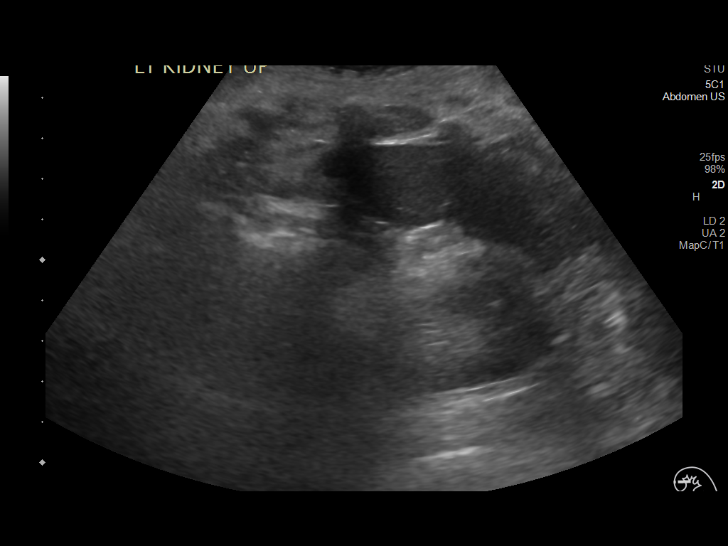
[im 27/80]
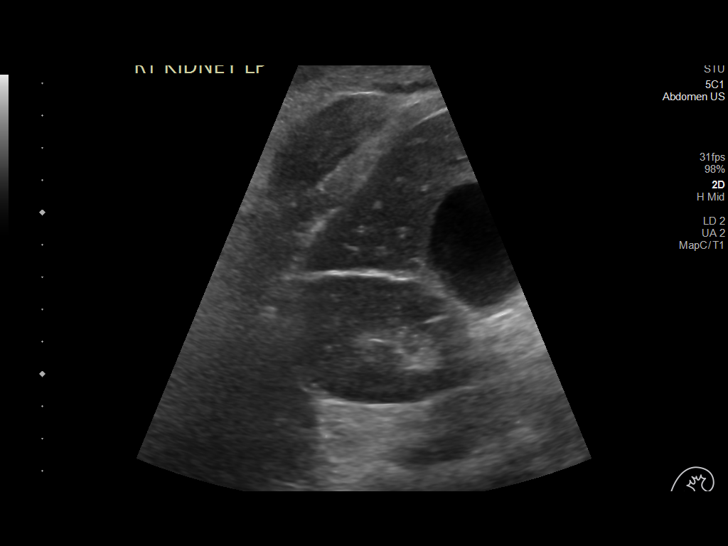
[im 30/80]
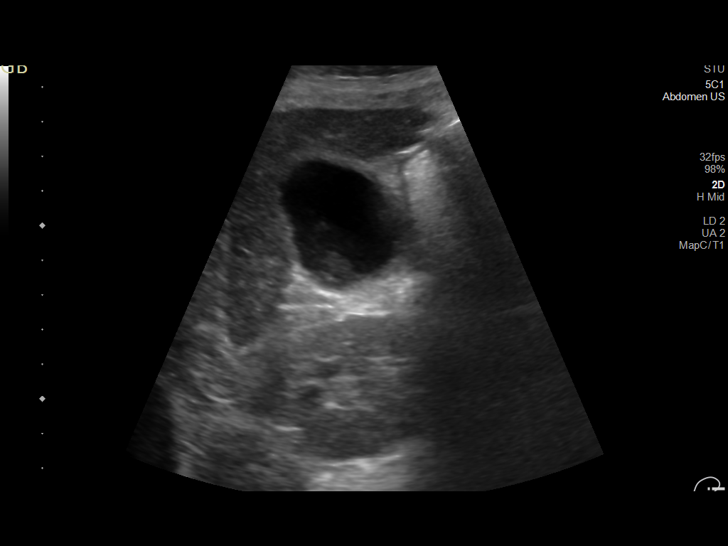
[im 37/80]
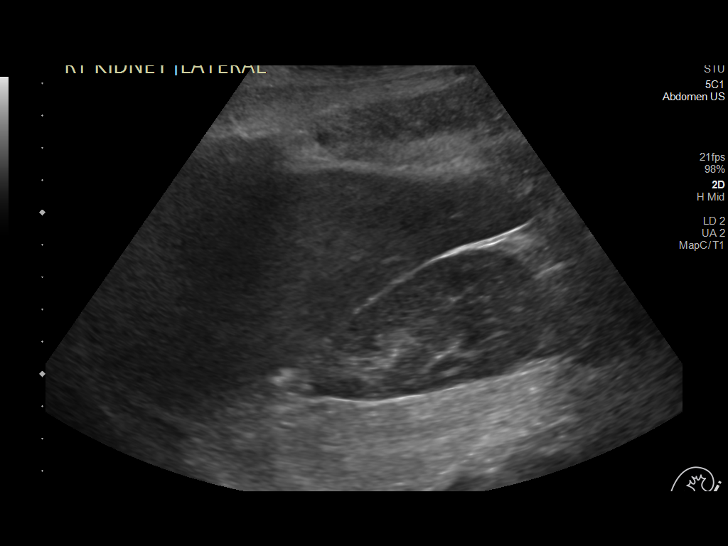
[im 43/80]
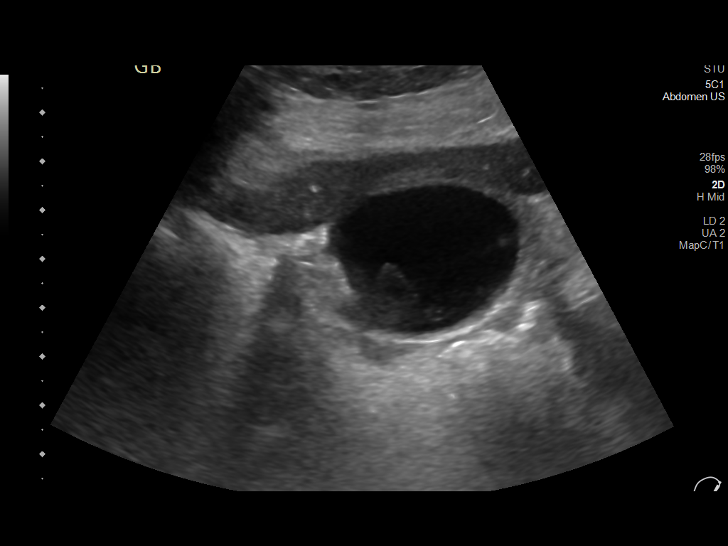
[im 50/80]
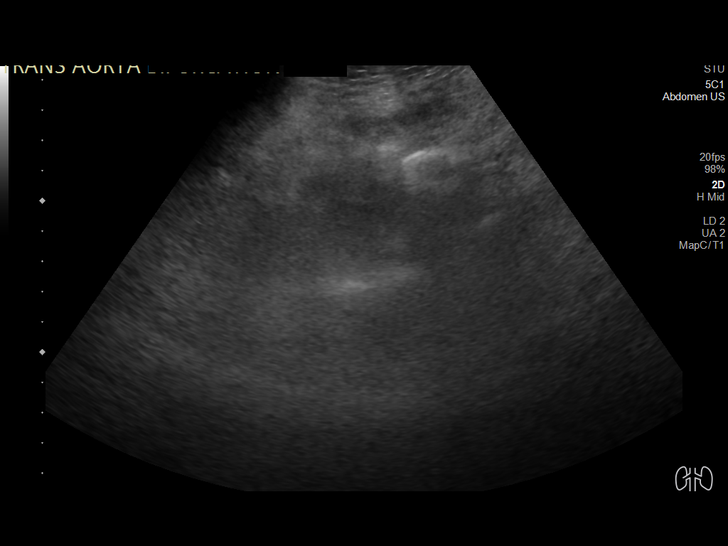
[im 53/80]
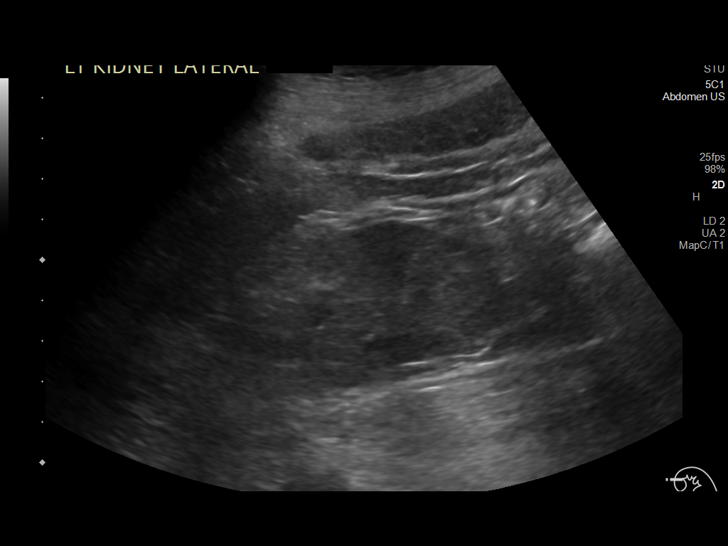
[im 60/80]
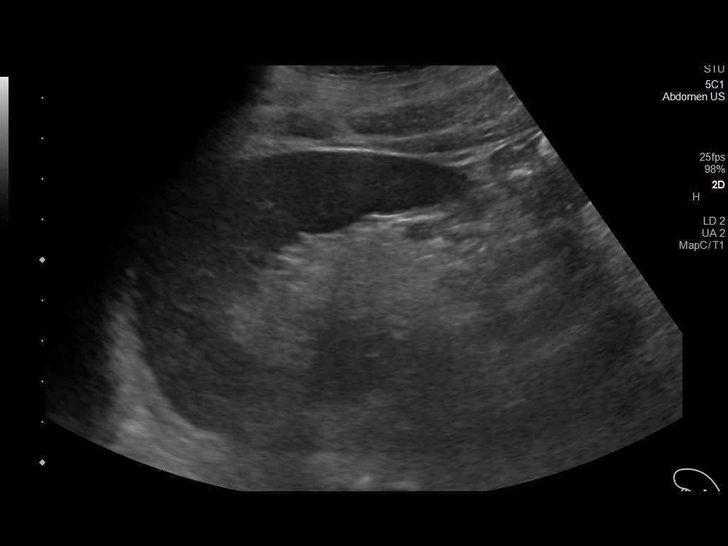
[im 66/80]
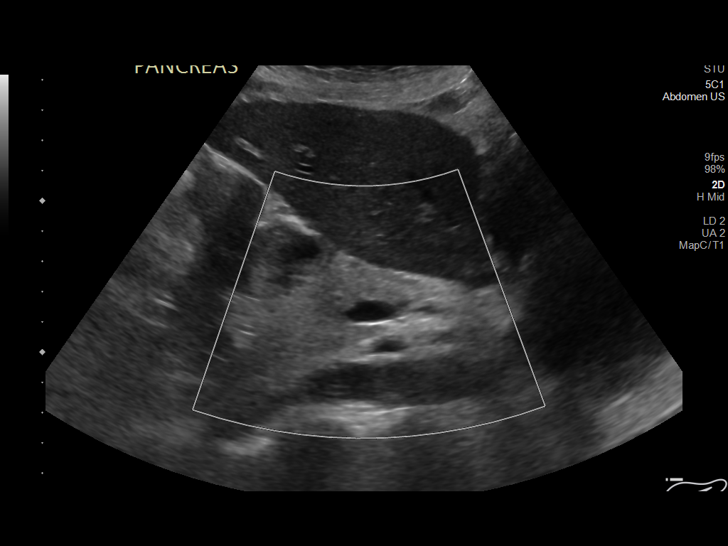
[im 73/80]
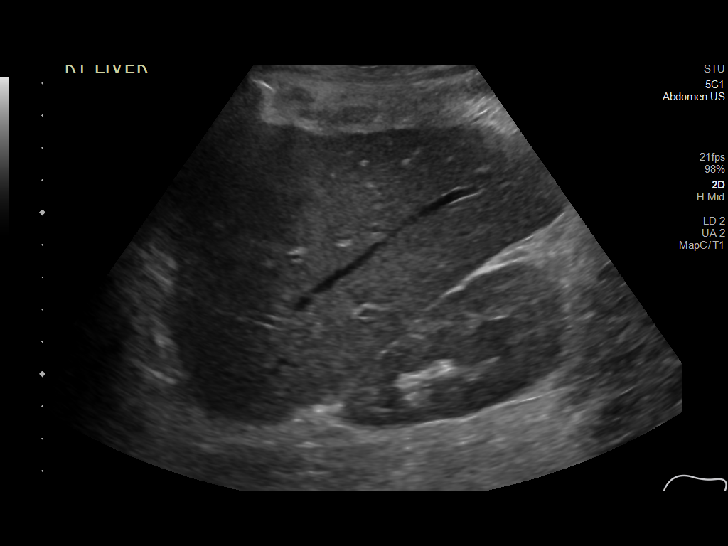
[im 80/80]
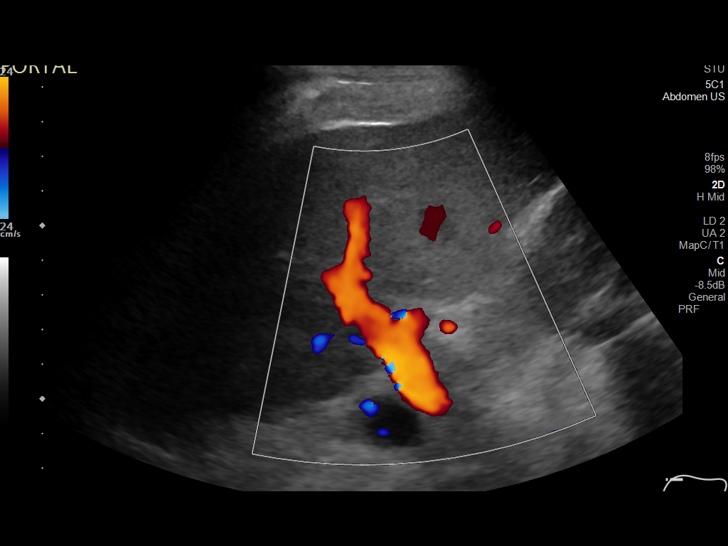

[14 of 25 positions shown; findings below may reference images not displayed]

FINDINGS: Gallbladder: Gallbladder is well distended without wall thickening.
Gallbladder sludge is noted. No definitive gallstones are seen.

Common bile duct: Diameter: 3 mm.

Liver: No focal lesion identified. Within normal limits in
parenchymal echogenicity. Portal vein is patent on color Doppler
imaging with normal direction of blood flow towards the liver.

IVC: No abnormality visualized.

Pancreas: Not well visualized

Spleen: Size and appearance within normal limits.

Right Kidney: Length: 10.1 cm. Echogenicity within normal limits.
No mass or hydronephrosis visualized.

Left Kidney: Length: 8.5 cm. Echogenicity within normal limits. No
mass or hydronephrosis visualized.

Abdominal aorta: No aneurysm visualized.

Other findings: None.
IMPRESSION: Gallbladder sludge.  No other focal abnormality is noted.

## 2022-03-27 IMAGING — DX DG CHEST 1V PORT
1 series · 2 of 2 positions shown · non-contrast
Comparison: Portable exam 9941 hours compared to 02/01/2020

CLINICAL DATA: Respiratory failure, intubation

EXAM:
PORTABLE CHEST 1 VIEW

[Series 1: chest ap · 0.14mm/px · 2 of 2 slices shown]
[im 1/2]
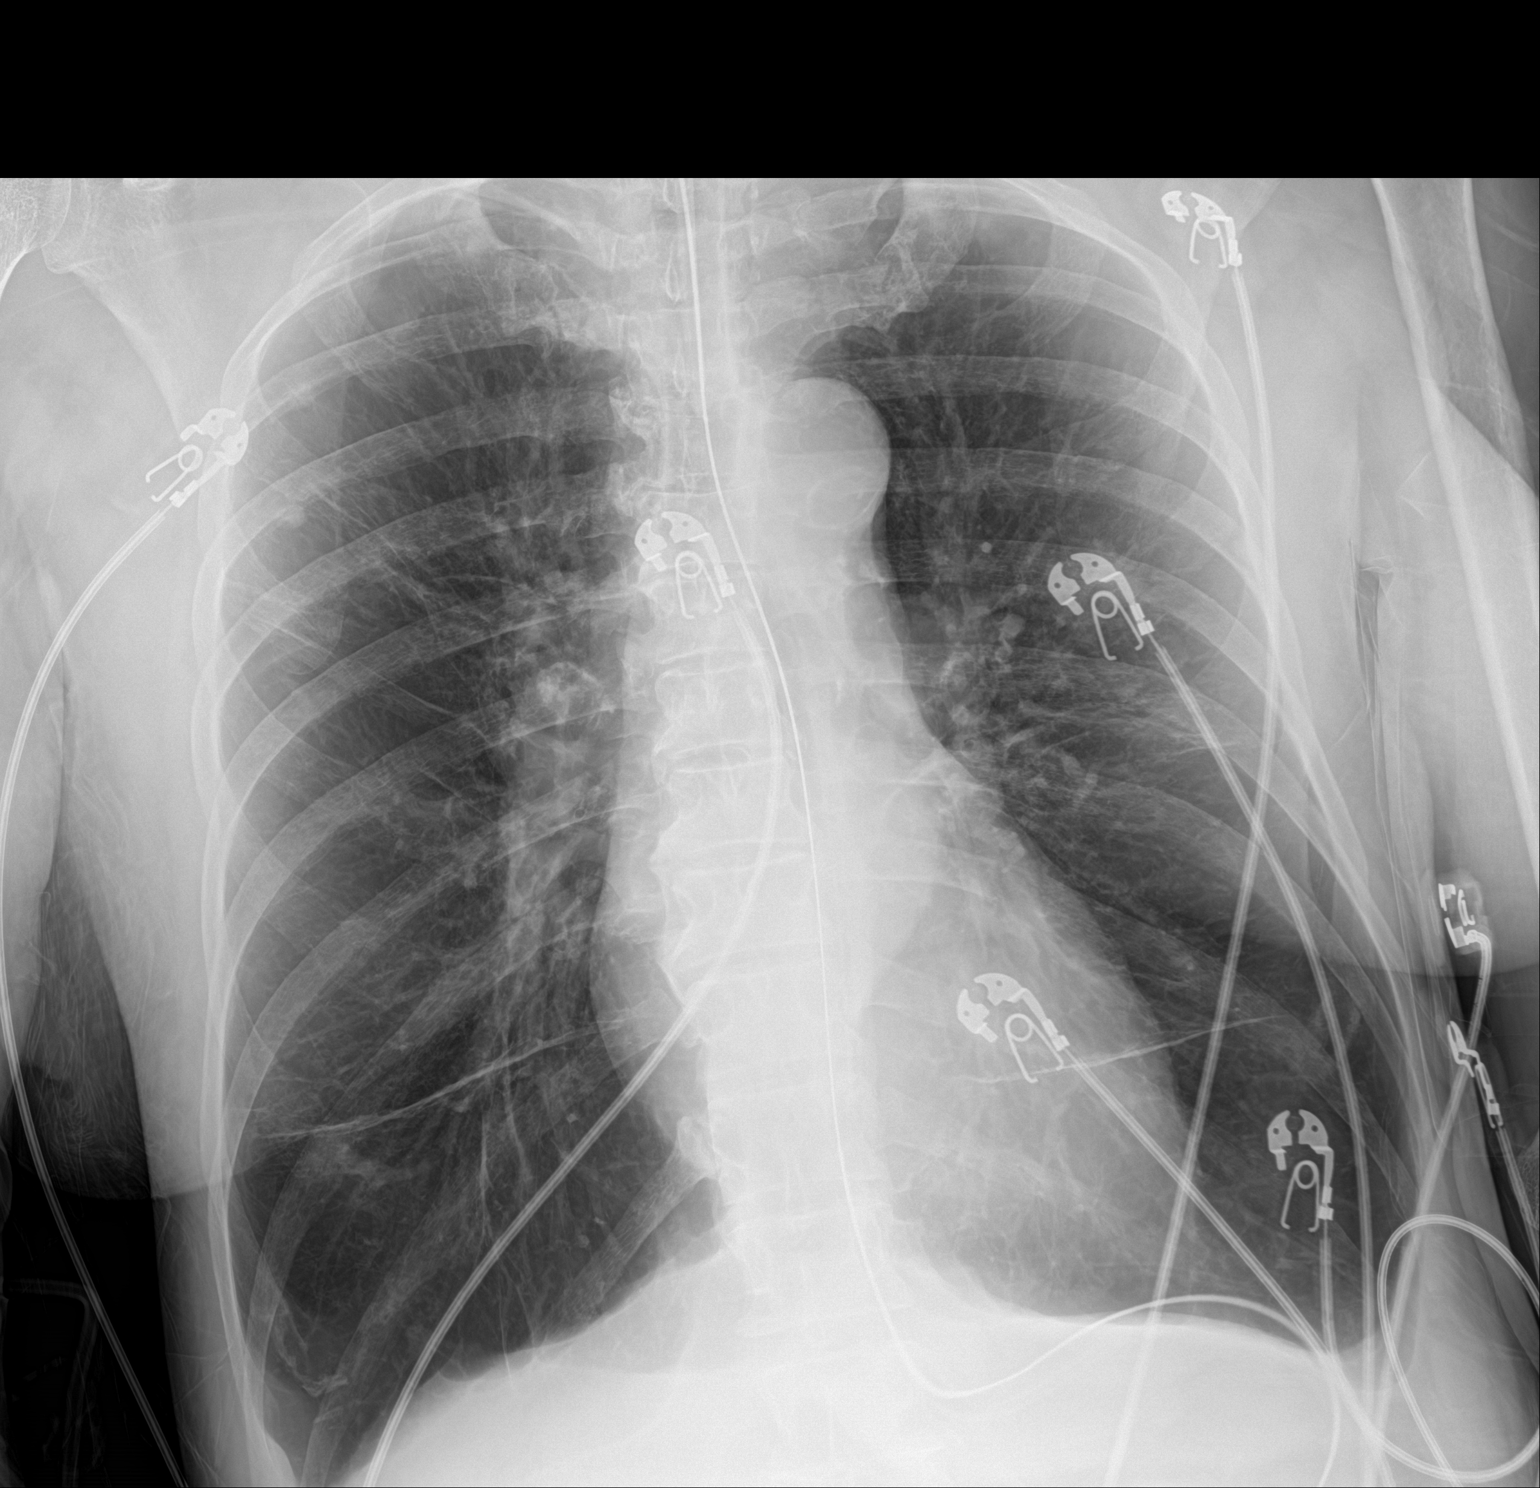
[im 2/2]
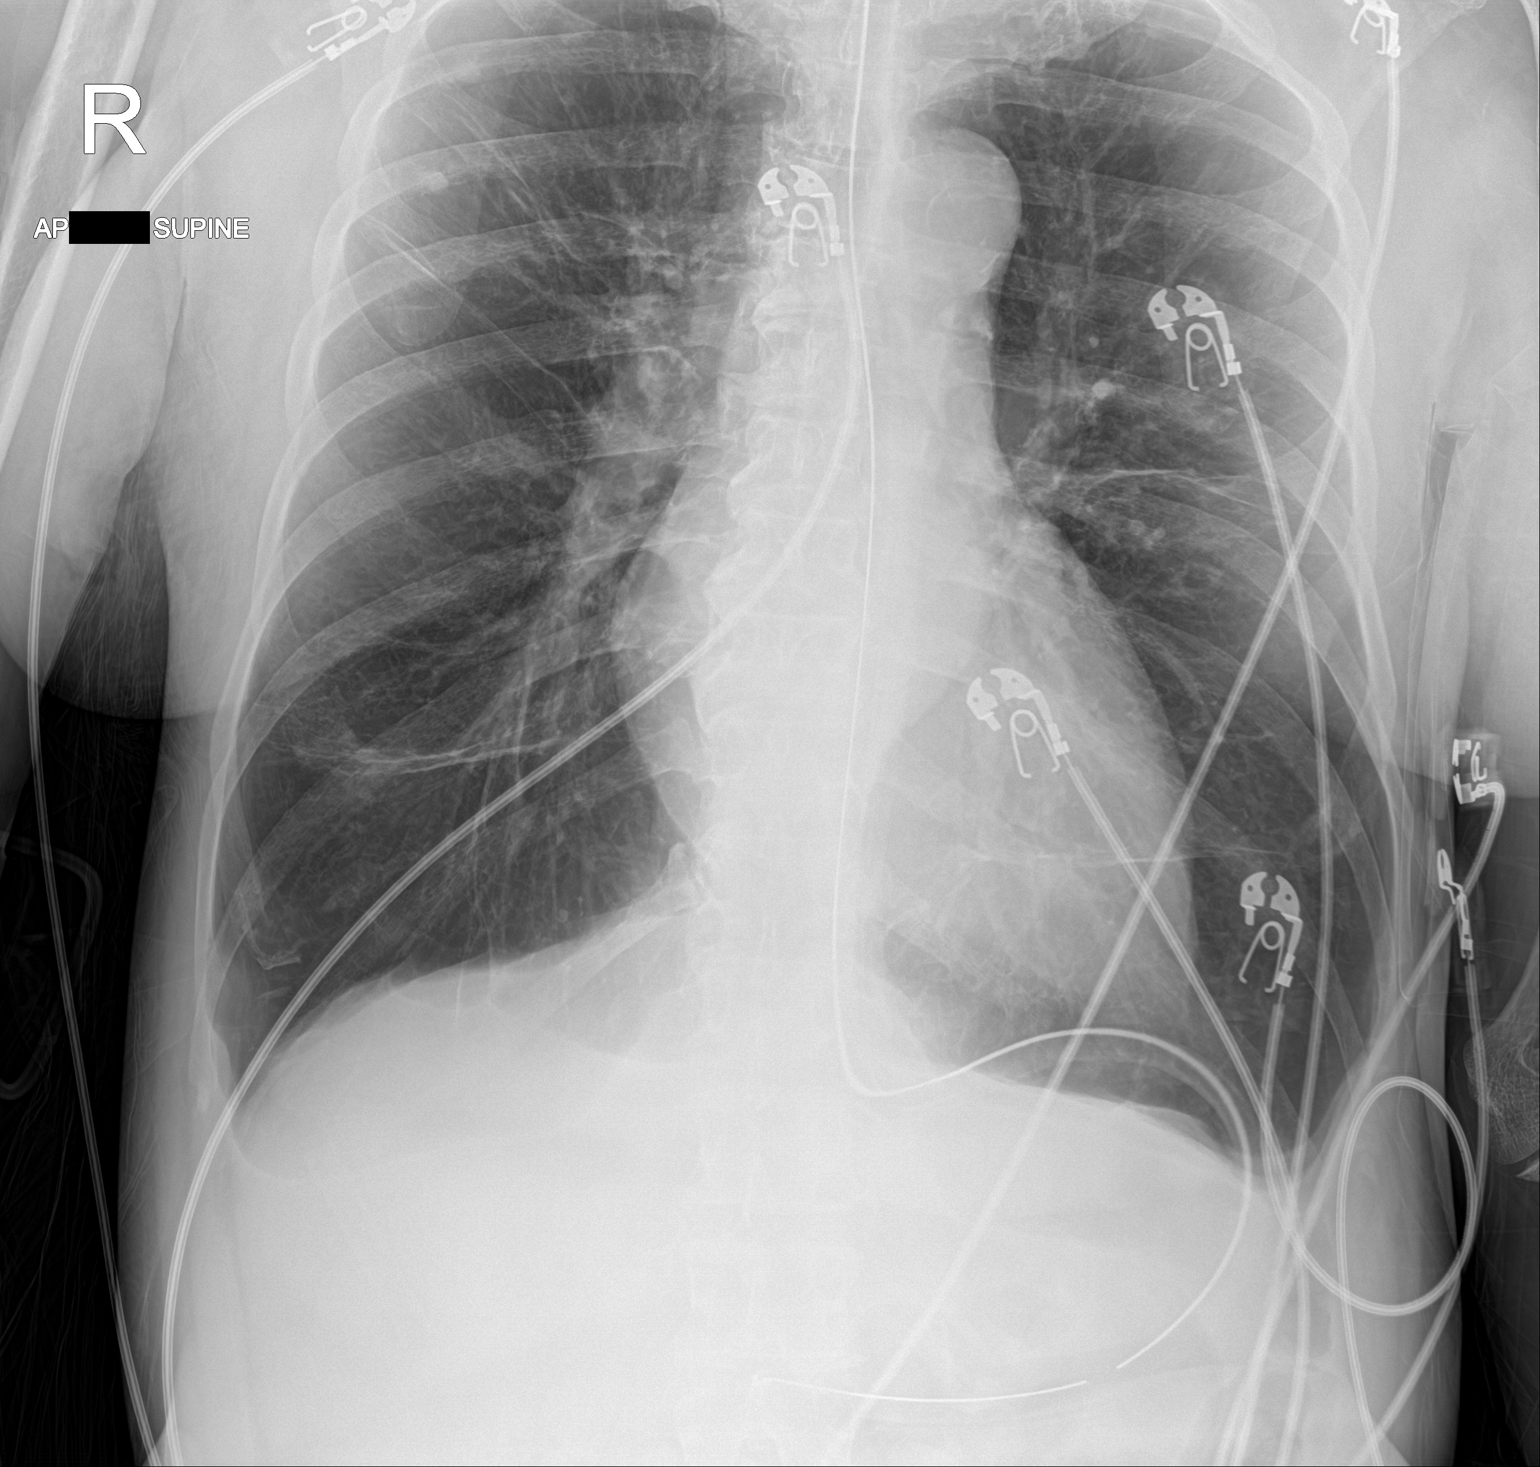

[2 of 2 positions shown; findings below may reference images not displayed]

FINDINGS: Tip of endotracheal tube projects 4.6 cm above carina.

Nasogastric tube extends into stomach.

Normal heart size, mediastinal contours, and pulmonary vascularity.

Atherosclerotic calcification and tortuosity of thoracic aorta.

Emphysematous changes with decreased subsegmental atelectasis in the
lower lungs bilaterally.

Calcified granuloma RIGHT upper lobe.

Questionable subtle LEFT perihilar infiltrate.

Remaining lungs clear.

No pneumothorax or significant pleural effusion.
IMPRESSION: COPD changes with decreased basilar atelectasis and questionable
subtle LEFT perihilar infiltrate.

Aortic Atherosclerosis ([id number]).

## 2022-03-27 IMAGING — DX DG CHEST 1V PORT
1 series · 1 of 1 positions shown · non-contrast
Comparison: Chest x-ray from same day at 2293 hours.

CLINICAL DATA: Central line placement.

EXAM:
PORTABLE CHEST 1 VIEW

[chest ap]
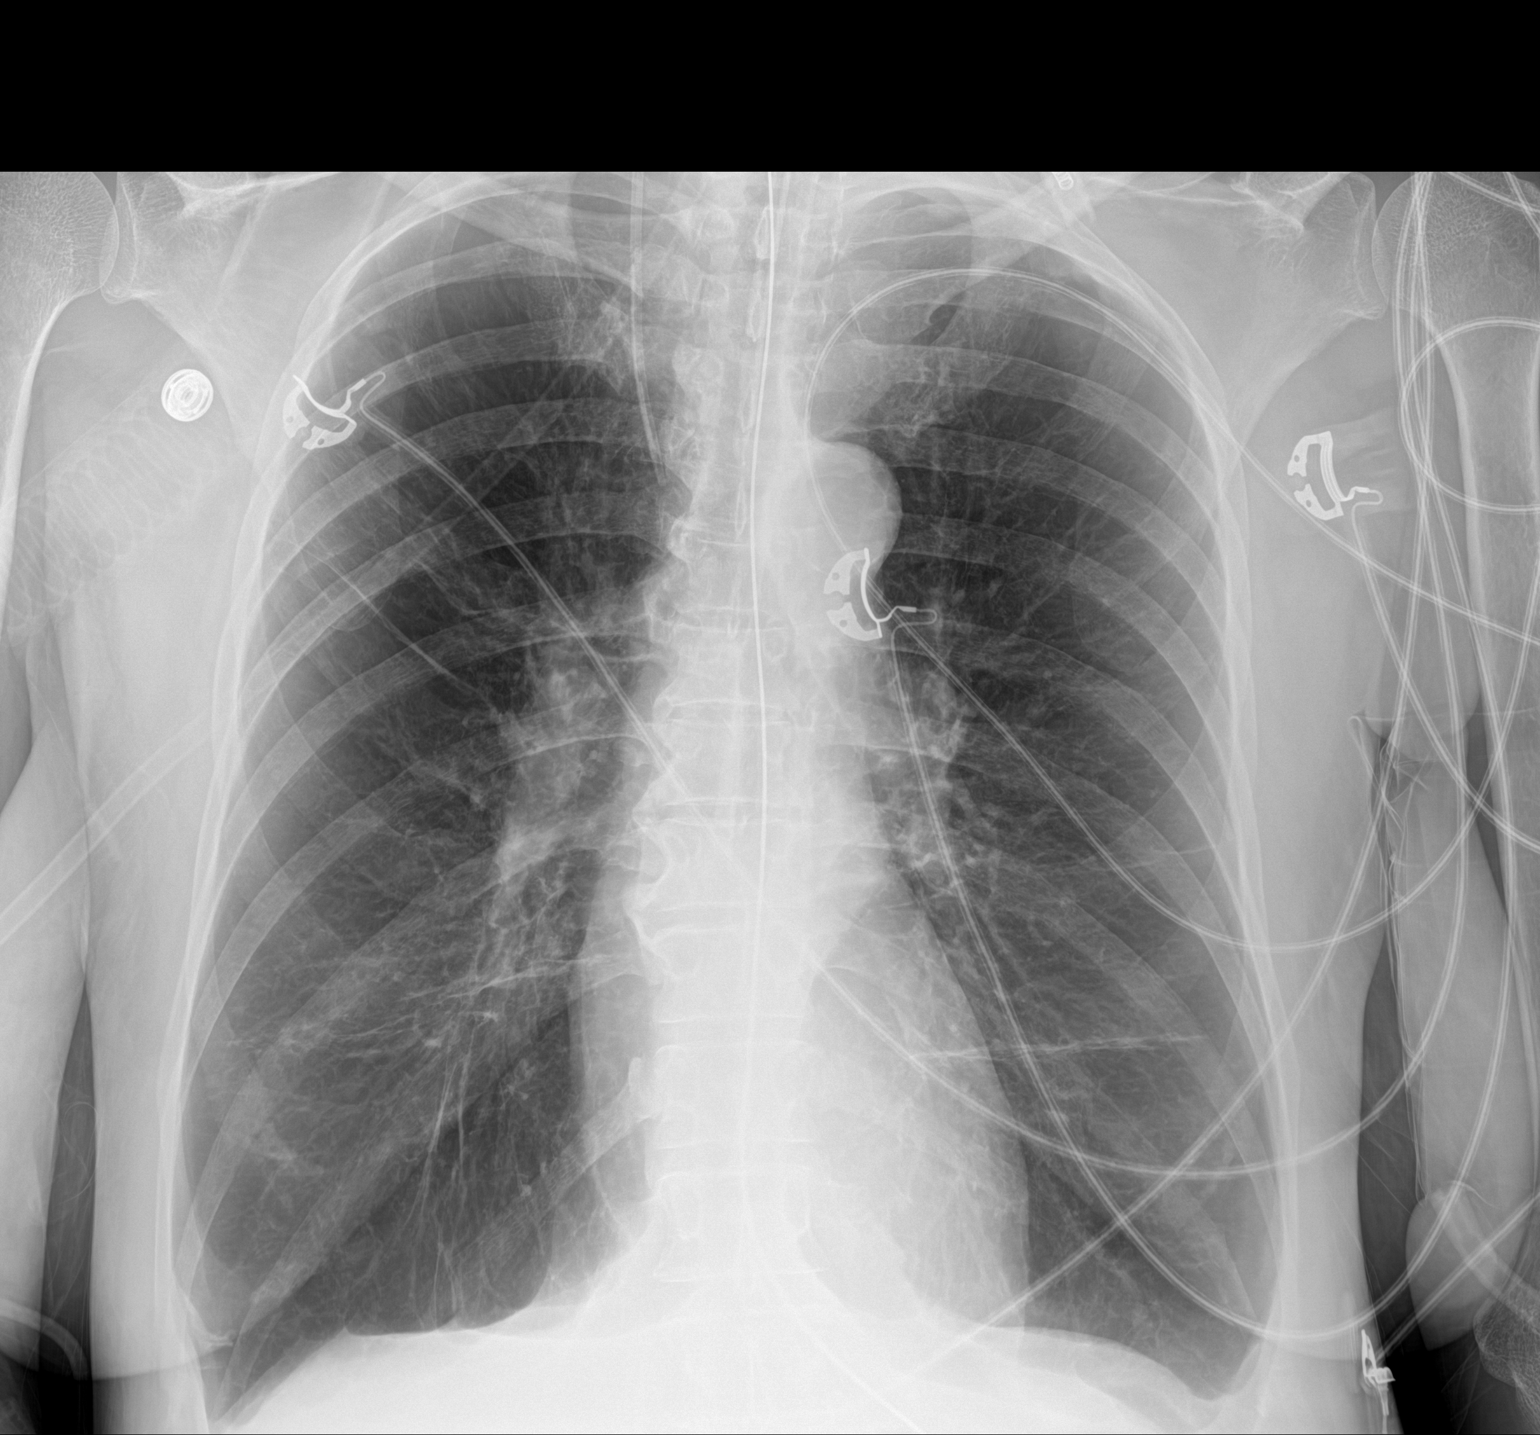

[1 of 1 positions shown; findings below may reference images not displayed]

FINDINGS: New right internal jugular central venous catheter with tip in the
upper SVC. Unchanged endotracheal and enteric tubes. The heart size
and mediastinal contours are within normal limits. Normal pulmonary
vascularity. The lungs remain hyperinflated with emphysematous
changes and scattered linear scarring. No focal consolidation,
pleural effusion, or pneumothorax. No acute osseous abnormality.
IMPRESSION: 1. New right internal jugular central venous catheter with tip in
the upper SVC. No complicating feature.
2. COPD.

## 2022-03-28 IMAGING — DX DG CHEST 1V
1 series · 1 of 1 positions shown · non-contrast
Comparison: Portable exam 6610 hours compared to 02/25/2020

CLINICAL DATA: Ventilation tube blocked

EXAM:
CHEST  1 VIEW

[chest]
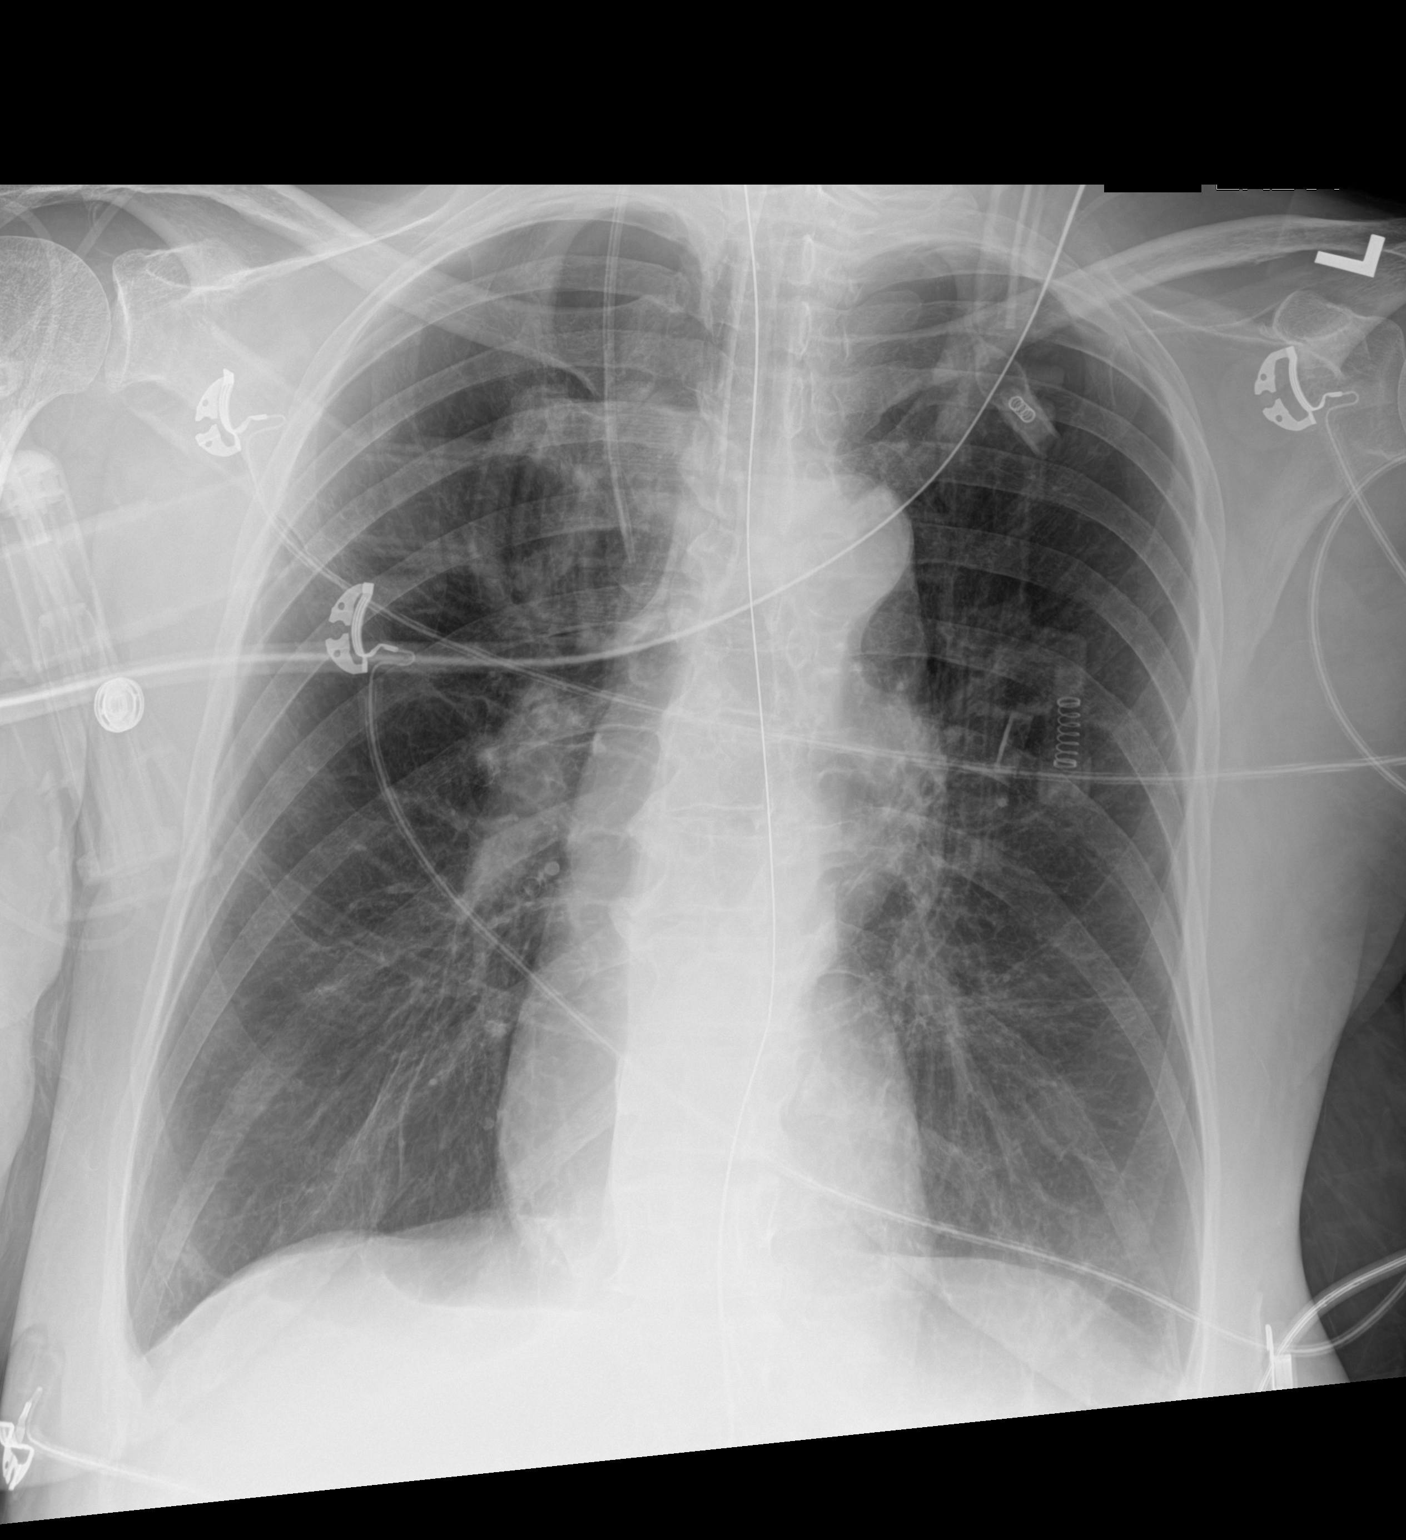

[1 of 1 positions shown; findings below may reference images not displayed]

FINDINGS: Tip of endotracheal tube projects 3.4 cm above carina.

Nasogastric tube extends into stomach.

RIGHT jugular line tip projecting over SVC.

Normal heart size, mediastinal contours, and pulmonary vascularity.

Virtual osseous thoracic aorta.

Minimal subsegmental atelectasis at lung bases.

Lungs hyperinflated but otherwise clear.

No acute infiltrate, pleural effusion, or pneumothorax.
IMPRESSION: Minimal bibasilar atelectasis.

## 2022-03-28 IMAGING — DX DG ABD PORTABLE 1V
1 series · 1 of 1 positions shown · non-contrast
Comparison: Portable exam 3212 hours compared to 2742 hours

CLINICAL DATA: Abdominal pain

EXAM:
PORTABLE ABDOMEN - 1 VIEW

[abdomen]
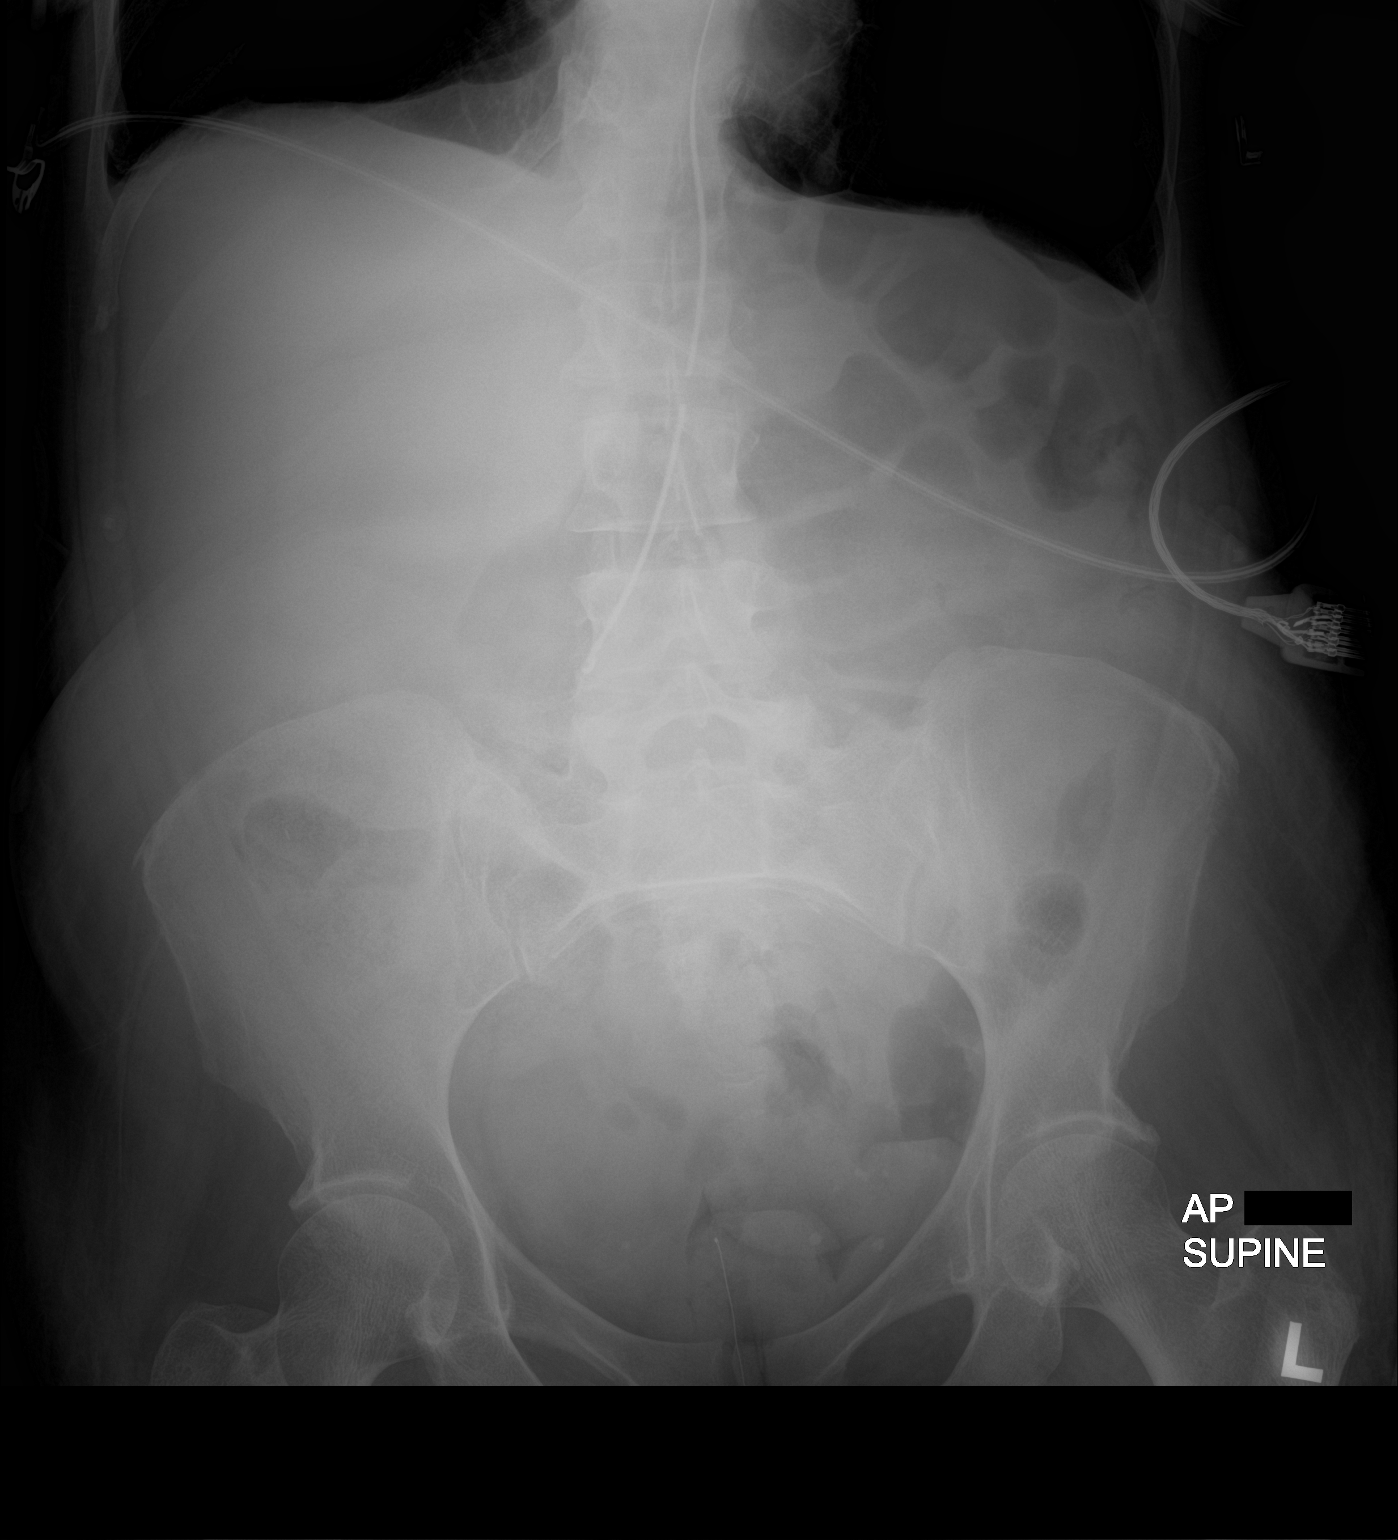

[1 of 1 positions shown; findings below may reference images not displayed]

FINDINGS: Nasogastric tube extends into stomach.

Nonobstructive bowel gas pattern.

No bowel dilatation or bowel wall thickening.

Lung bases hyperinflated but clear.

Osseous structures unremarkable.
IMPRESSION: Nonobstructive bowel gas pattern.

## 2022-03-28 IMAGING — DX DG ABD PORTABLE 1V
1 series · 2 of 2 positions shown · non-contrast
Comparison: 02/01/2020 abdominal radiograph

CLINICAL DATA: Abdominal pain, onychogryphosis

EXAM:
PORTABLE ABDOMEN - 1 VIEW

[Series 1: abdomen kub · 0.14mm/px · 2 of 2 slices shown]
[im 1/2]
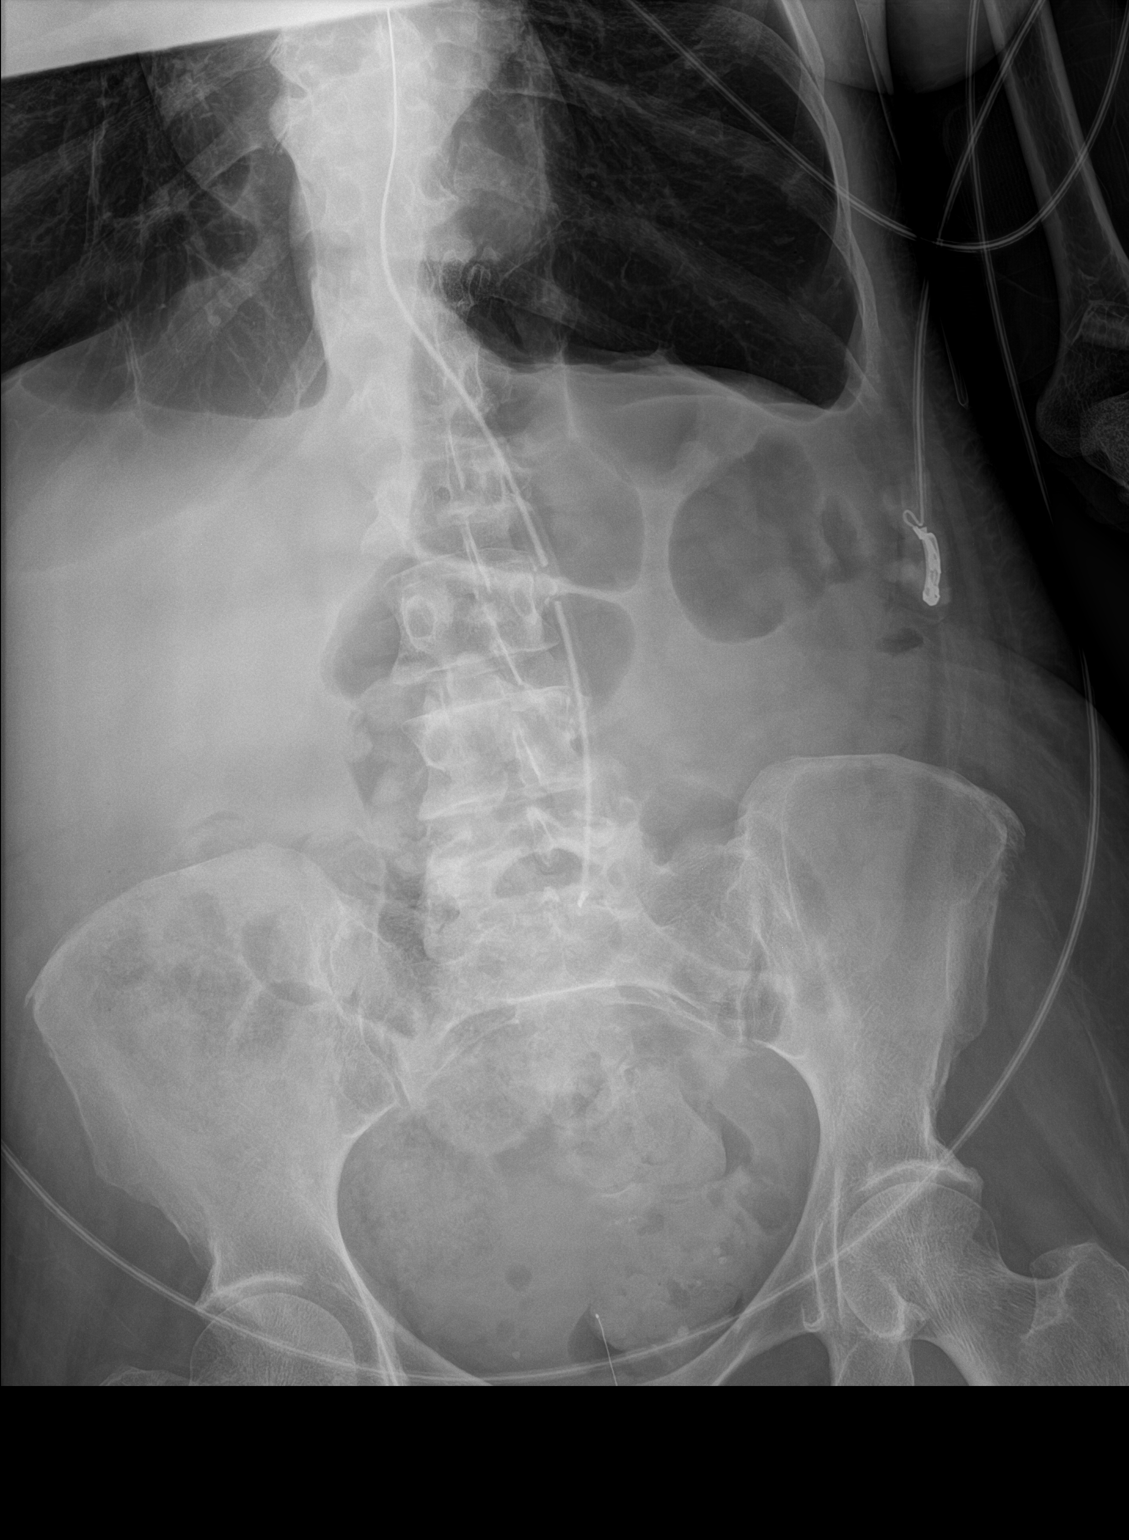
[im 2/2]
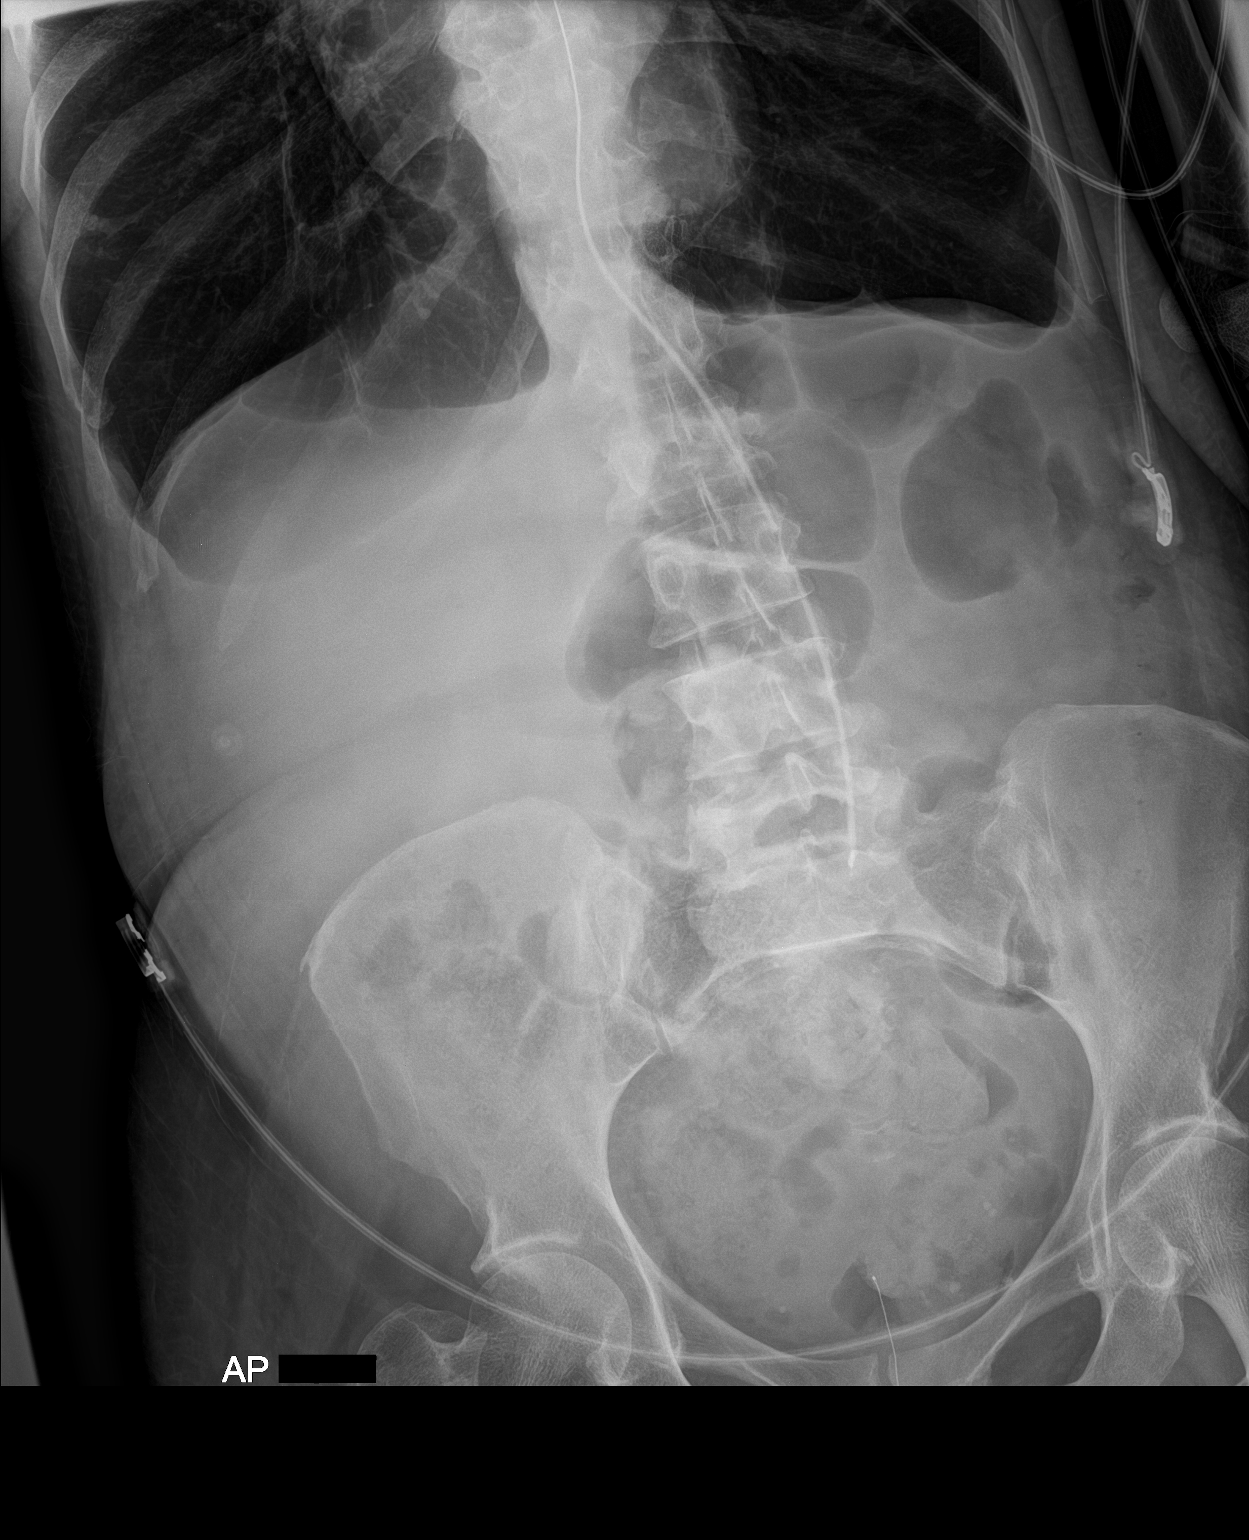

[2 of 2 positions shown; findings below may reference images not displayed]

FINDINGS: Enteric tube terminates in the body of the stomach. No dilated small
bowel loops. Moderate colorectal stool and gas. No evidence of
pneumatosis or pneumoperitoneum. No radiopaque nephrolithiasis. Mild
scarring at the lung bases. Emphysema.
IMPRESSION: Enteric tube terminates in the body of the stomach. Nonobstructive
bowel gas pattern. Moderate colorectal stool and gas.

## 2022-03-30 IMAGING — DX DG CHEST 1V PORT
2 series · 2 of 2 positions shown · non-contrast
Comparison: Two days ago

CLINICAL DATA: Respiratory distress

EXAM:
PORTABLE CHEST 1 VIEW

[chest ap (1 of 2)]
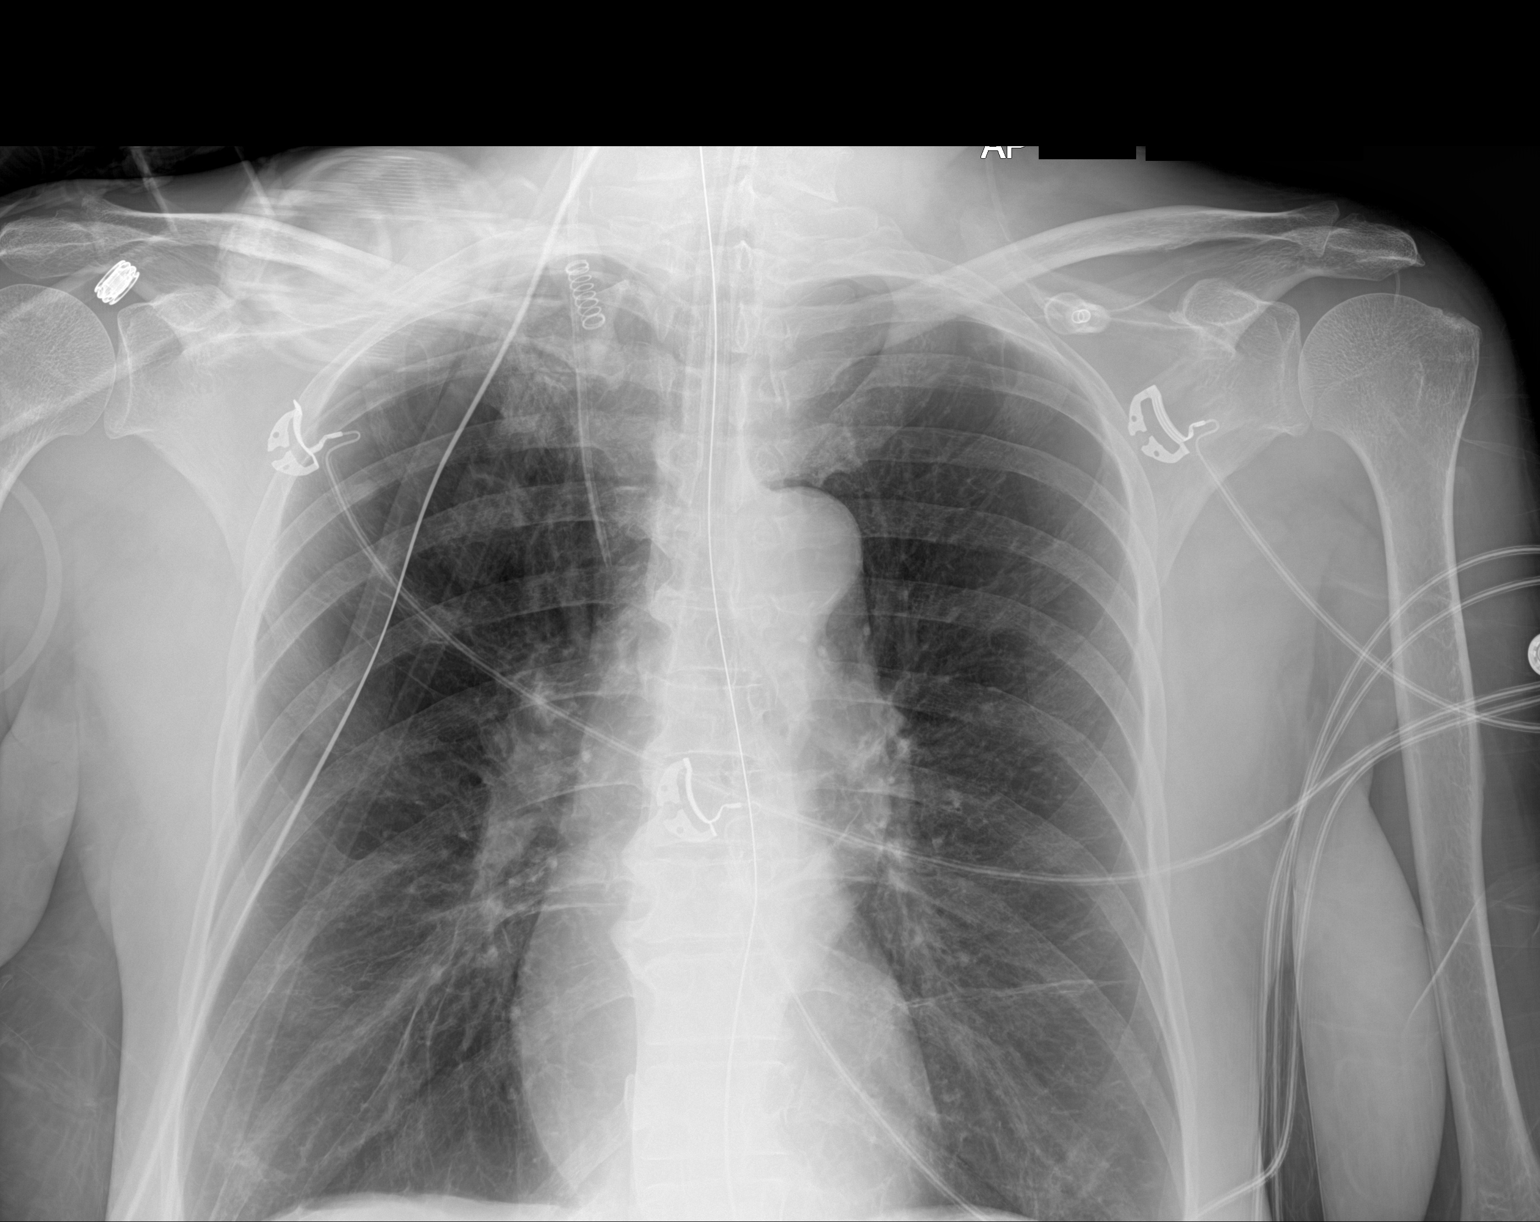

[chest ap (2 of 2)]
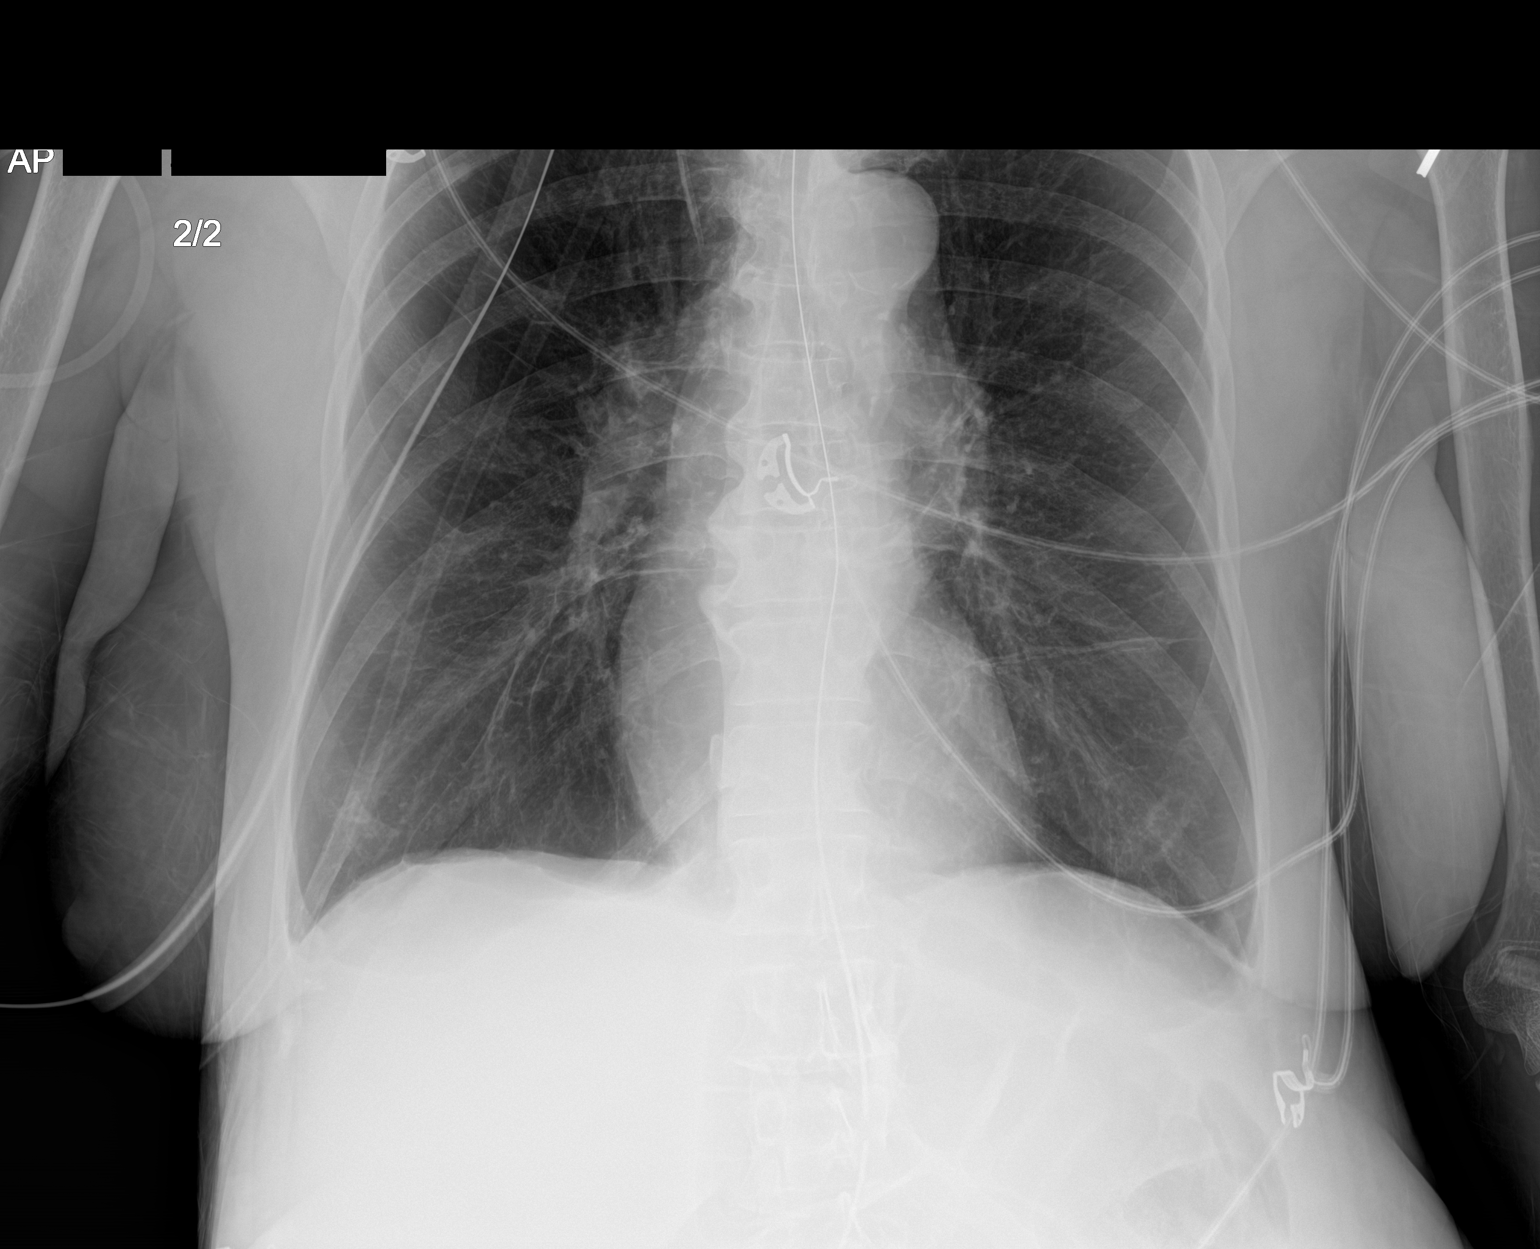

[2 of 2 positions shown; findings below may reference images not displayed]

FINDINGS: Endotracheal tube with tip between the clavicular heads and carina.
The orogastric tube reaches the stomach. Right IJ line with tip at
the SVC. Fine reticular opacities attributed atelectasis or
scarring. No edema, effusion, or pneumothorax. Hyperinflated
appearance.
IMPRESSION: Stable hardware positioning and symmetric inflation.

## 2022-03-30 IMAGING — DX DG ABD PORTABLE 1V
1 series · 1 of 1 positions shown · non-contrast
Comparison: Portable exam 5352 hours compared to 02/26/2020

CLINICAL DATA: Abdominal pain in RIGHT upper quadrant,
constipation, intubated

EXAM:
PORTABLE ABDOMEN - 1 VIEW

[abdomen kub]
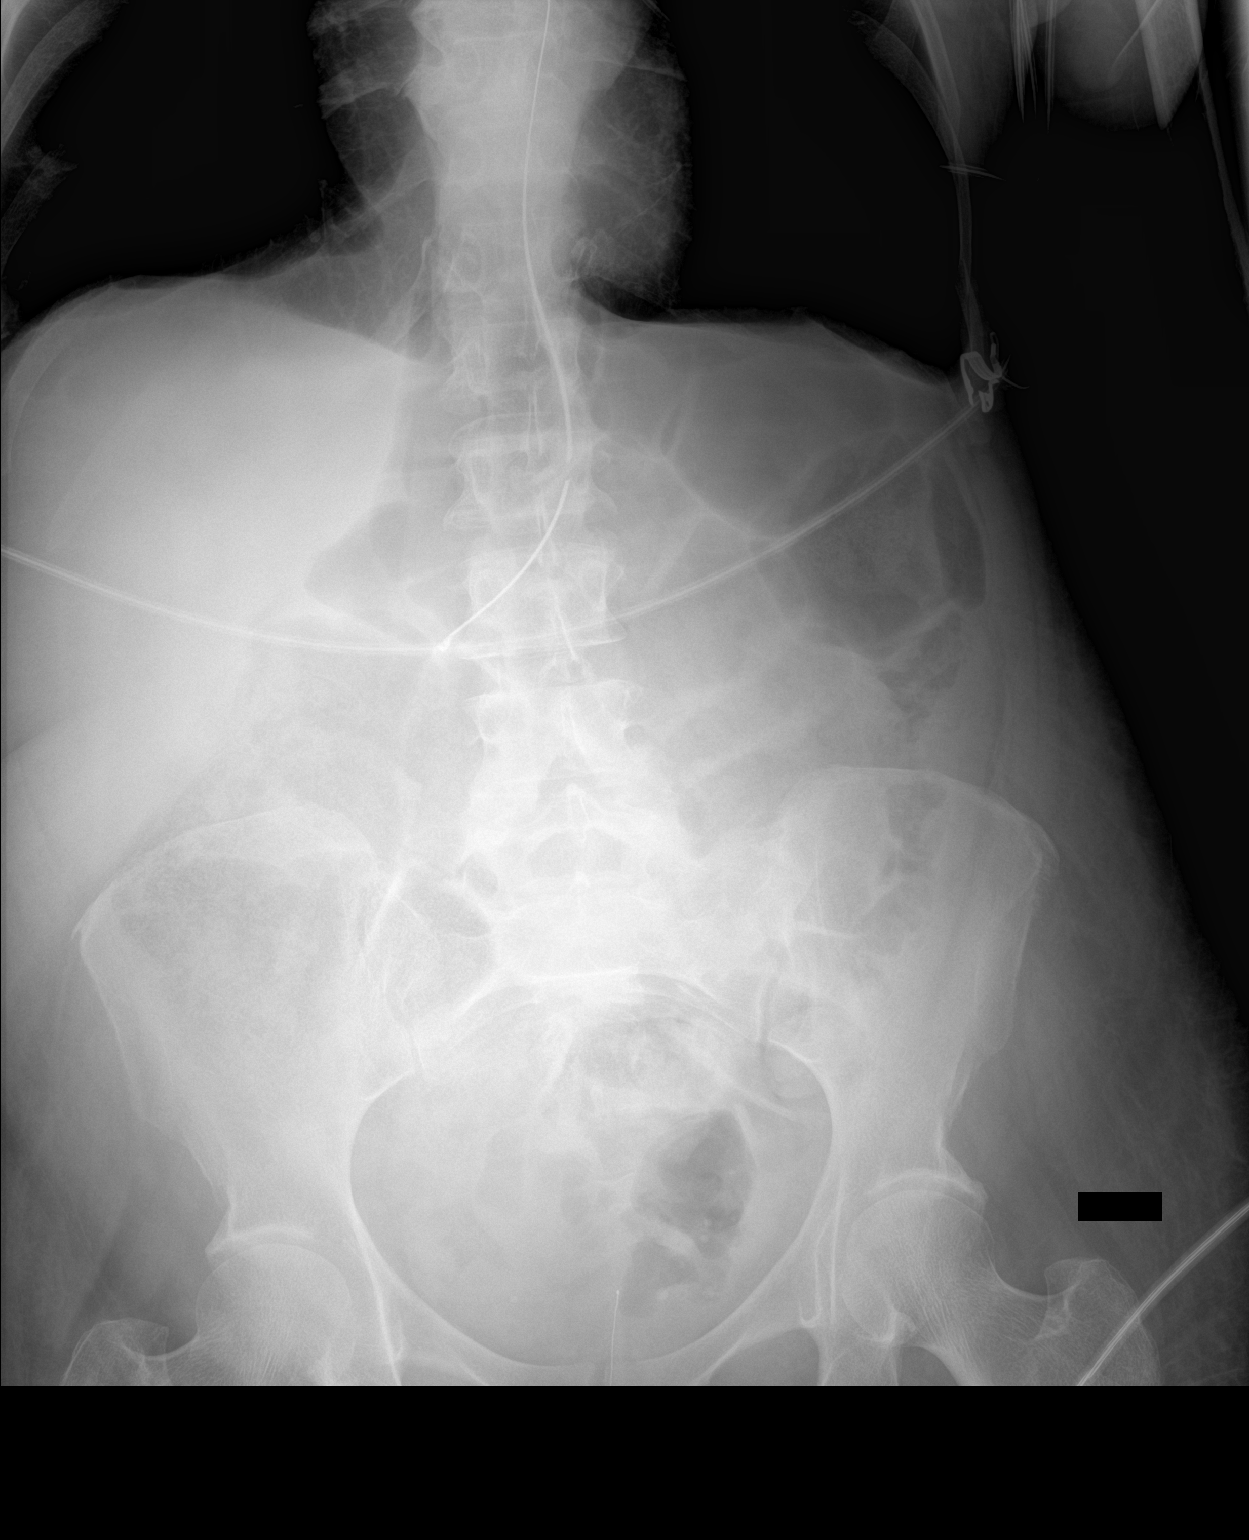

[1 of 1 positions shown; findings below may reference images not displayed]

FINDINGS: Tip of nasogastric tube projects over stomach.

Slight gaseous distention of transverse colon though gas is present
to the rectum.

Stool in RIGHT colon.

No small bowel dilatation.

No bowel wall thickening or evidence of obstruction.

Lung bases hyperinflated but clear.
IMPRESSION: Nonobstructive bowel gas pattern.

## 2022-03-30 IMAGING — CT CT CHEST-ABD-PELV W/ CM
2 of 5 series · 11 of 36 positions shown, 16 images · IV contrast (Omni 300)
Comparison: None.

CLINICAL DATA: Abdominal pain.  Altered mental status

EXAM:
CT CHEST, ABDOMEN, AND PELVIS WITH CONTRAST
TECHNIQUE: Multidetector CT imaging of the chest, abdomen and pelvis was
performed following the standard protocol during bolus
administration of intravenous contrast.
CONTRAST:  100mL OMNIPAQUE IOHEXOL 300 MG/ML  SOLN

[Series 3: cap with 5mm st · axial · 0.89mm/px · z∈[-715,-240]mm · 8 of 123 slices shown, 13 images]
[im 14/123  mediastinal]
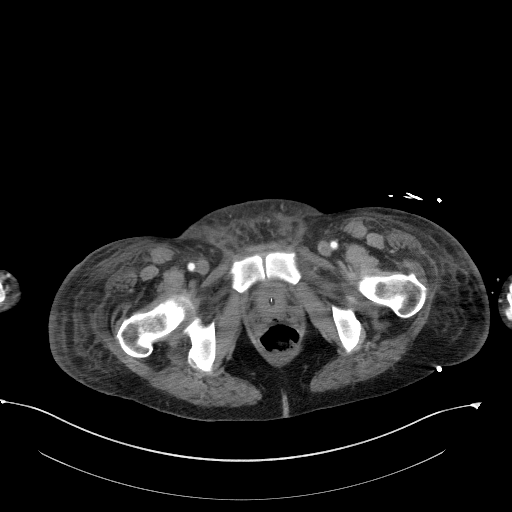
[im 14/123  bone]
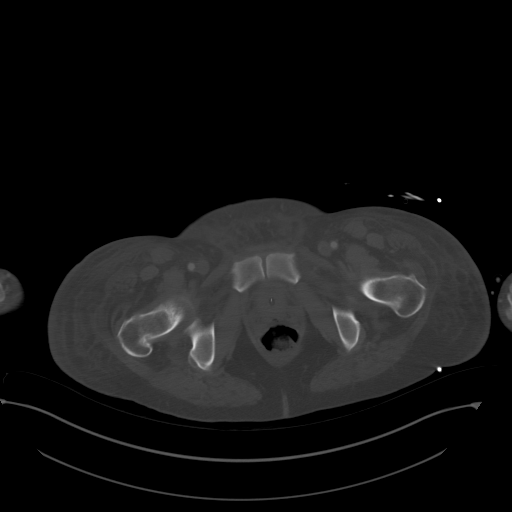
[im 28/123  mediastinal]
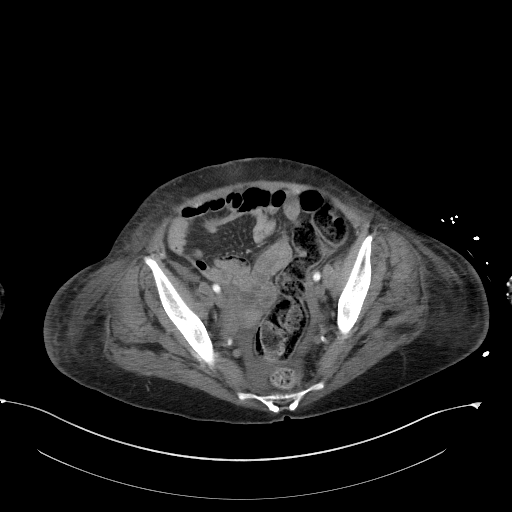
[im 41/123  mediastinal]
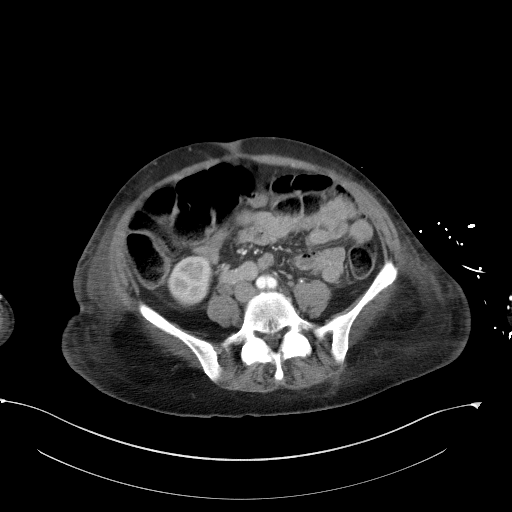
[im 55/123  mediastinal]
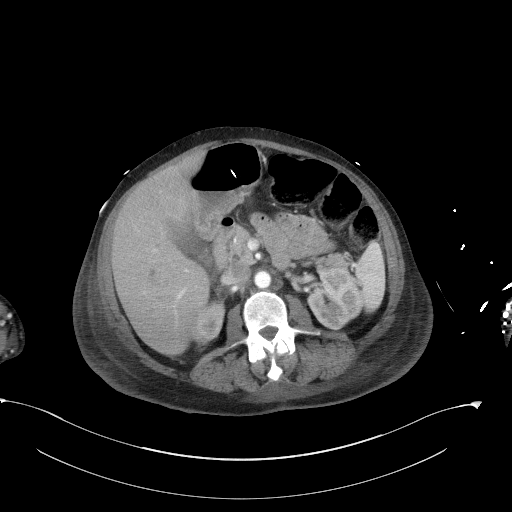
[im 68/123  mediastinal]
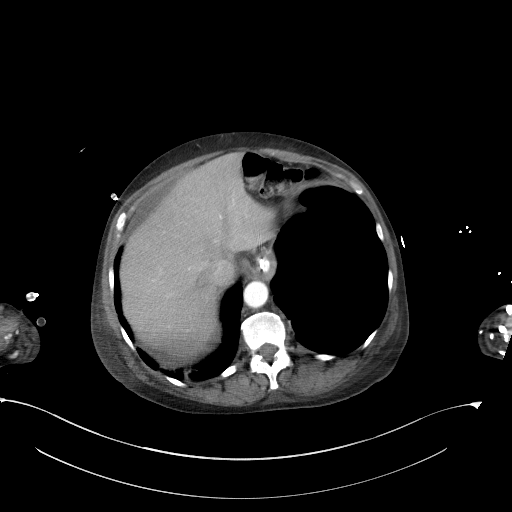
[im 68/123  lung]
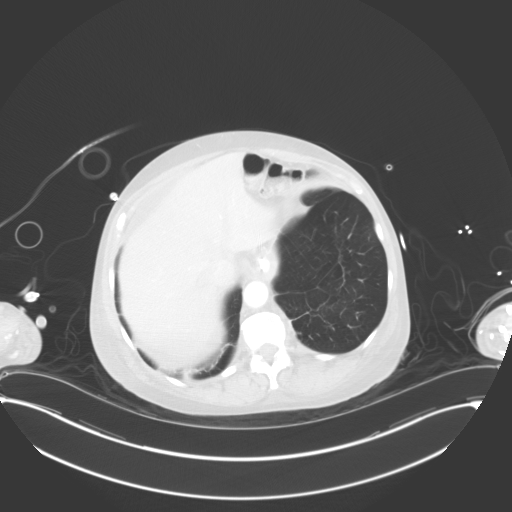
[im 82/123  mediastinal]
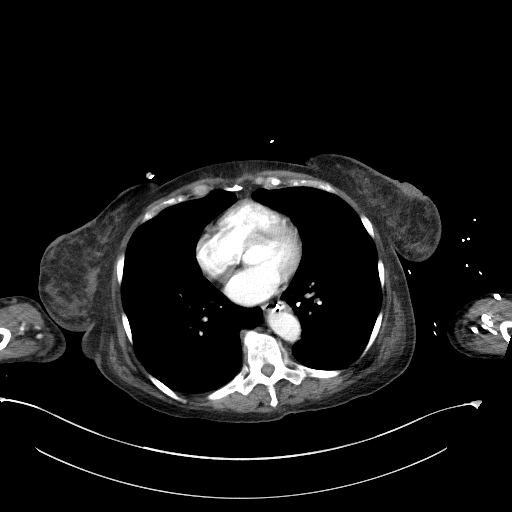
[im 82/123  lung]
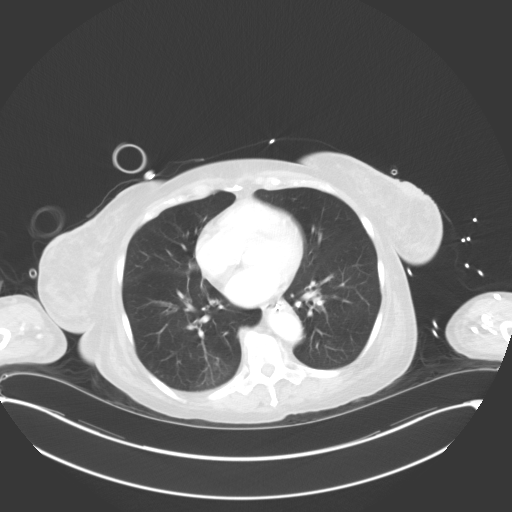
[im 95/123  mediastinal]
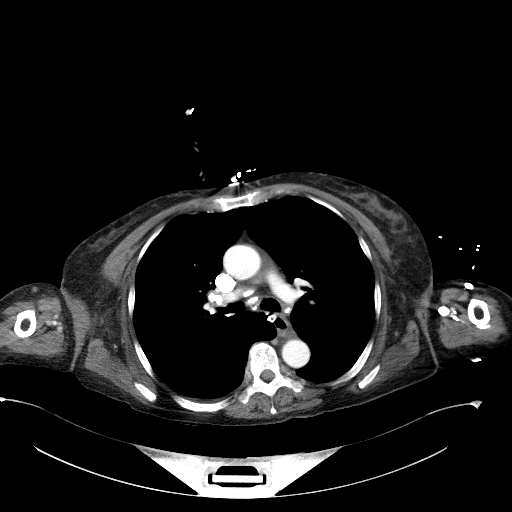
[im 95/123  lung]
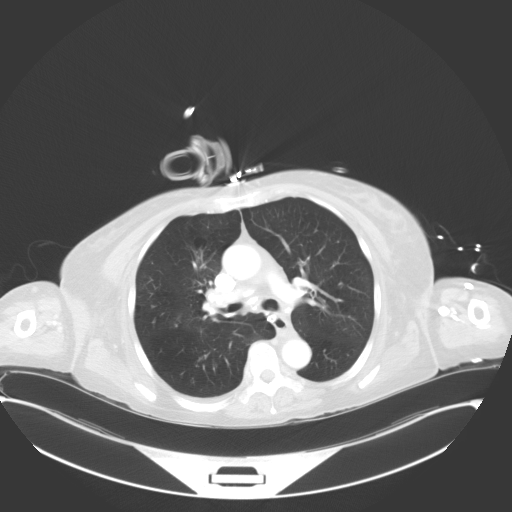
[im 109/123  mediastinal]
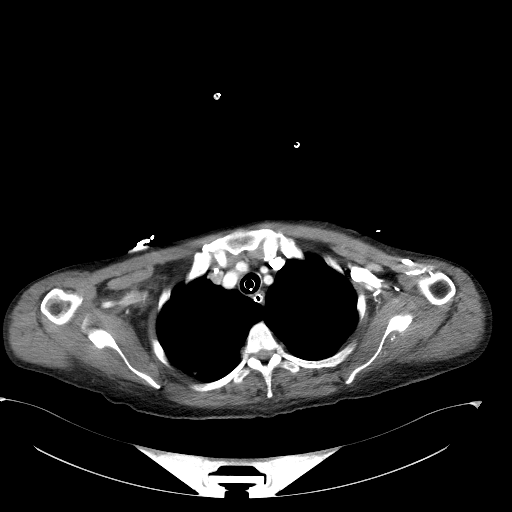
[im 109/123  lung]
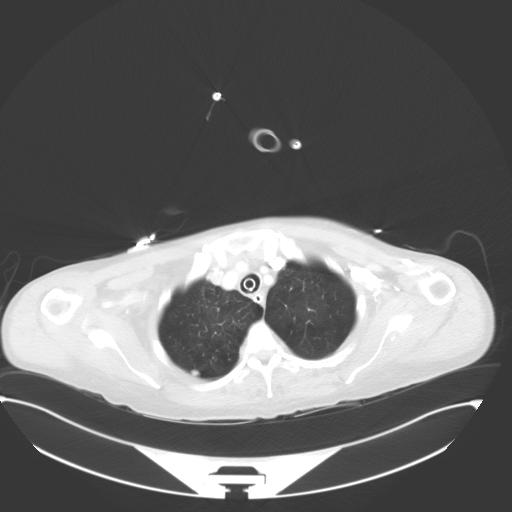

[Series 6: cap with 3mm st cor · coronal · 0.80mm/px · 3 of 159 slices shown]
[im 32/159  mediastinal]
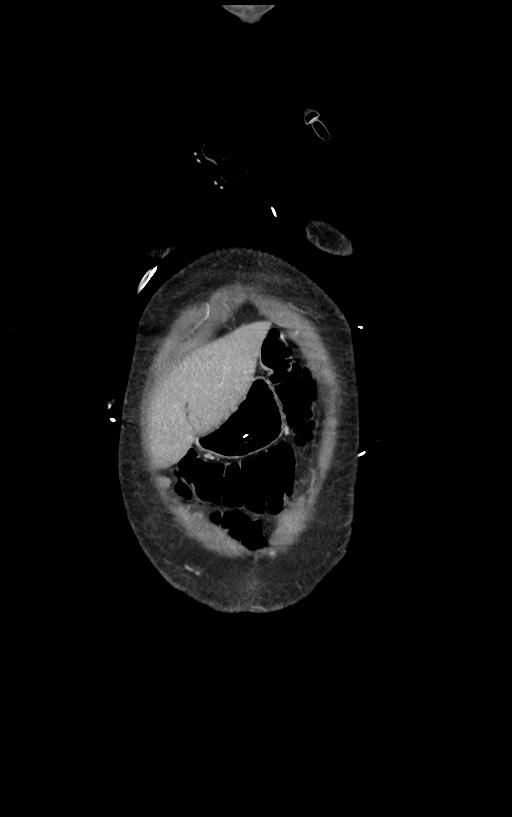
[im 64/159  mediastinal]
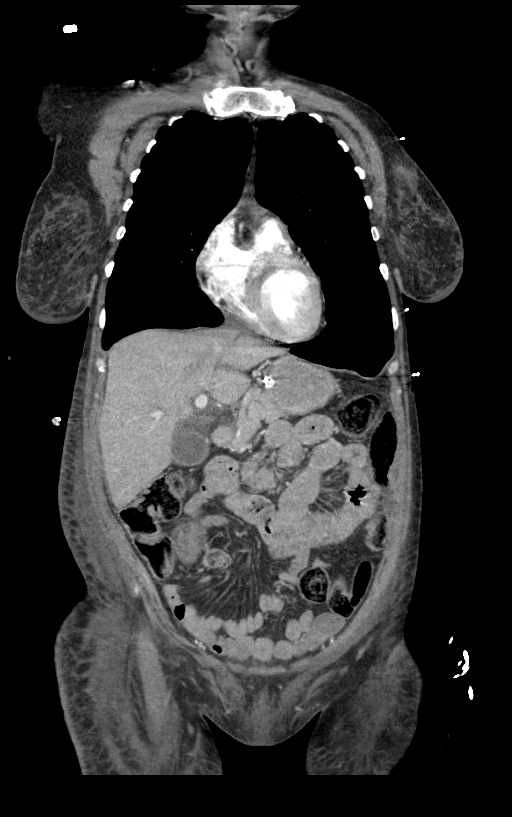
[im 95/159  mediastinal]
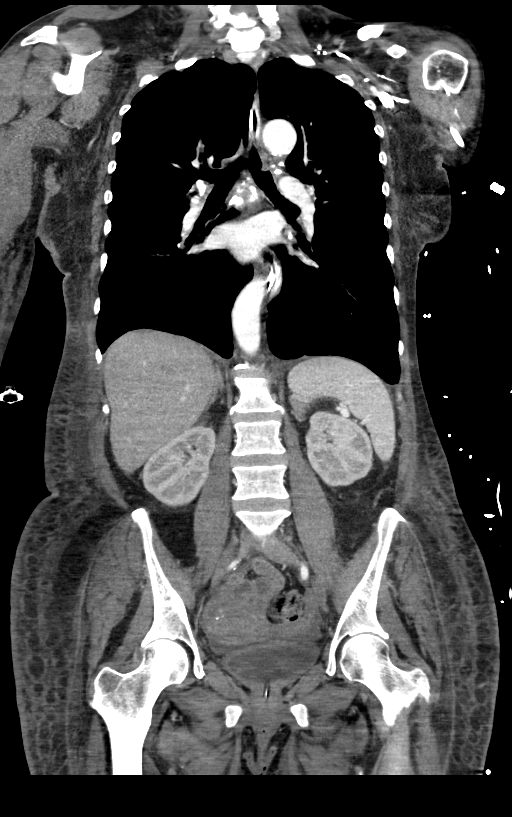

[11 of 36 positions shown; findings below may reference images not displayed]

FINDINGS: CT CHEST FINDINGS

Cardiovascular: No significant vascular findings. Normal heart size.
No pericardial effusion.

Mediastinum/Nodes: Multiple calcified mediastinal hilar lymph nodes.
Intubated patient with endotracheal tube approximately 2 cm from
carina. Feeding tube extends the esophagus.

Lungs/Pleura: No pneumothorax. No airspace disease. Multiple large
pleural nodules are present primarily in the upper lobes. Example
nodule in the RIGHT measures 11 mm with a stalk like attachment to
the pleural surface (image 53/series 4. Similar nodule in the RIGHT
lung apex measures 5 mm on image 30. The 3 LEFT apical nodules
measuring less than 5 mm on image [DATE].

Musculoskeletal: No aggressive osseous lesion.

CT ABDOMEN AND PELVIS FINDINGS

Hepatobiliary: No focal hepatic lesion. Gallbladder normal. No
biliary duct dilatation.

Pancreas: Pancreas is normal. No ductal dilatation. No pancreatic
inflammation.

Spleen: Normal spleen

Adrenals/urinary tract: Adrenal glands normal. Kidneys enhance
symmetrically. Renal obstruction. Ureters normal. Foley catheter
within the bladder.

Stomach/Bowel: NG tube extends the stomach. Duodenum small bowel
normal. No evidence of bowel obstruction or inflammation. Cecum is
normal. Ascending, transverse and descending colon normal. No
evidence of bowel obstruction. Moderate volume stool of stool

Vascular/Lymphatic: Abdominal aorta normal caliber. Celiac trunk and
SMA are patent. No lymphadenopathy.

Reproductive: . uterus is normal.  No adnexal abnormality.

Other: Small volume of free fluid in the RIGHT pelvis has simple
fluid attenuation

Musculoskeletal: No aggressive osseous lesion.
IMPRESSION: Chest Impression:

1. Intubated patient.
2. No acute cardiopulmonary findings.
3. Calcified mediastinal lymph nodes and multiple pleural base
nodule. Favor chronic pulmonary findings.

Abdomen / Pelvis Impression:

1. No evidence of bowel obstruction or inflammation.
2. Small amount free fluid the pelvis.
3. Normal pancreas.
4. No renal obstruction.

## 2022-03-30 IMAGING — CT CT HEAD W/O CM
4 series · 16 of 47 positions shown, 18 images · non-contrast
Comparison: Head CT 01/28/2020.

CLINICAL DATA: 54-year-old female with altered mental status.
Abdominal pain, hypoxia.

EXAM:
CT HEAD WITHOUT CONTRAST
TECHNIQUE: Contiguous axial images were obtained from the base of the skull
through the vertex without intravenous contrast.

[Series 3: head without · axial · non-contrast · 0.45mm/px · z∈[-78,+47]mm · 7 of 35 slices shown, 9 images]
[im 5/35  brain]
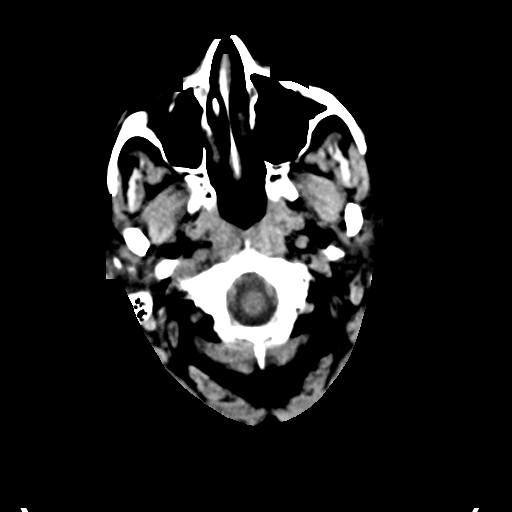
[im 5/35  bone]
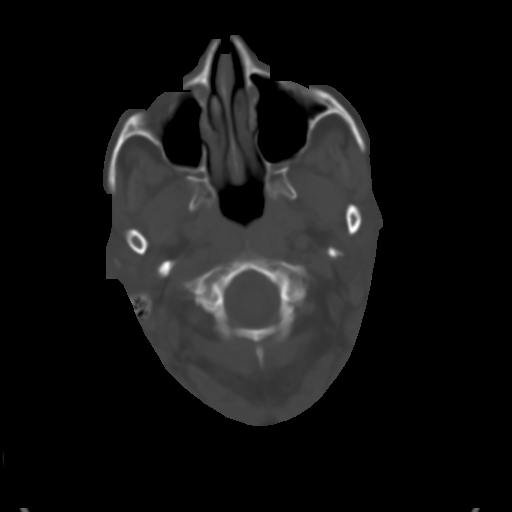
[im 9/35  brain]
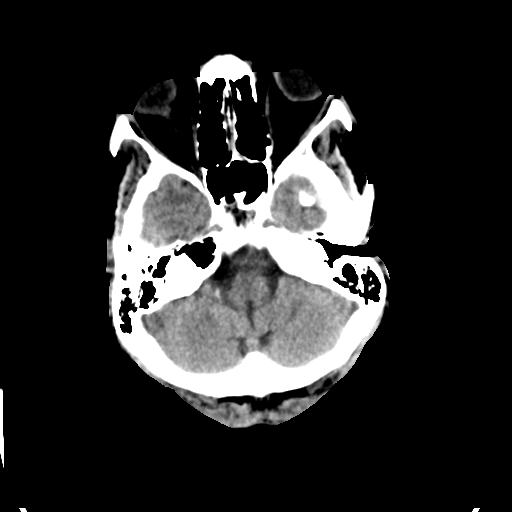
[im 13/35  brain]
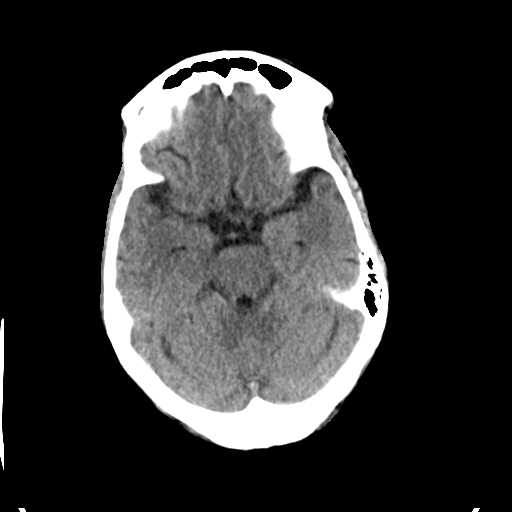
[im 18/35  brain]
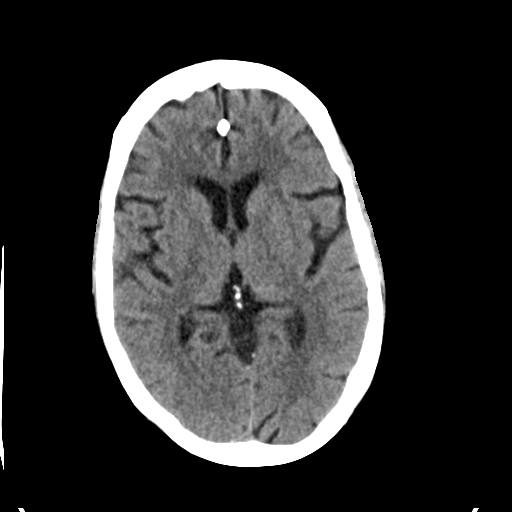
[im 22/35  brain]
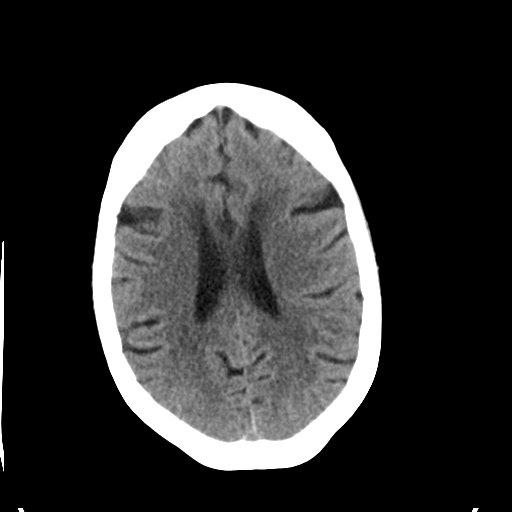
[im 22/35  bone]
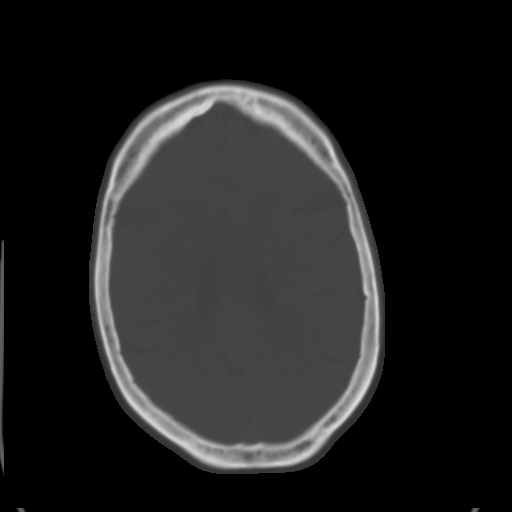
[im 26/35  brain]
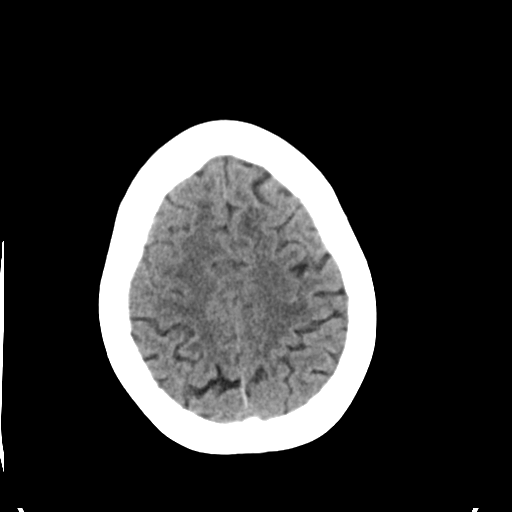
[im 30/35  brain]
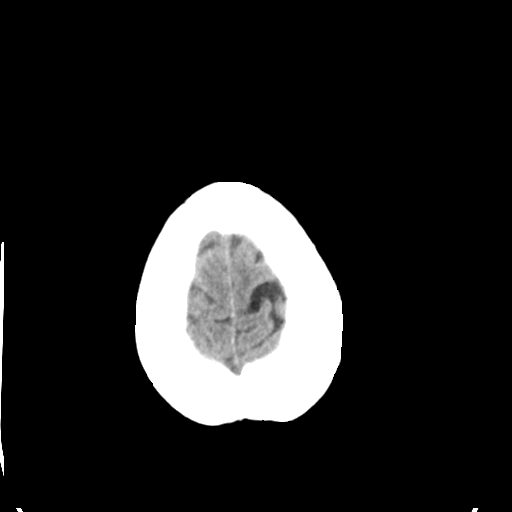

[Series 4: head bone · axial · 0.45mm/px · z∈[-82,-48]mm · 3 of 86 slices shown]
[im 9/86  bone]
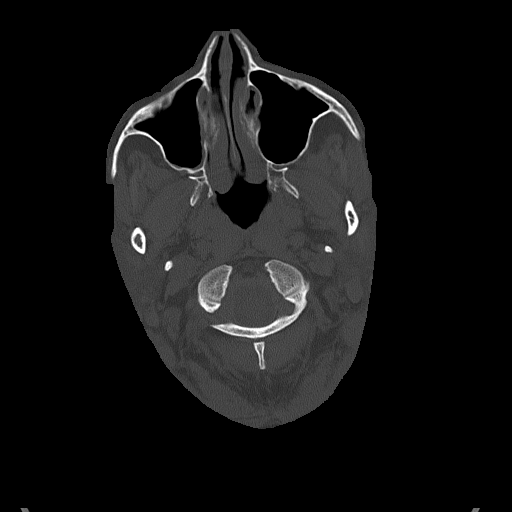
[im 18/86  bone]
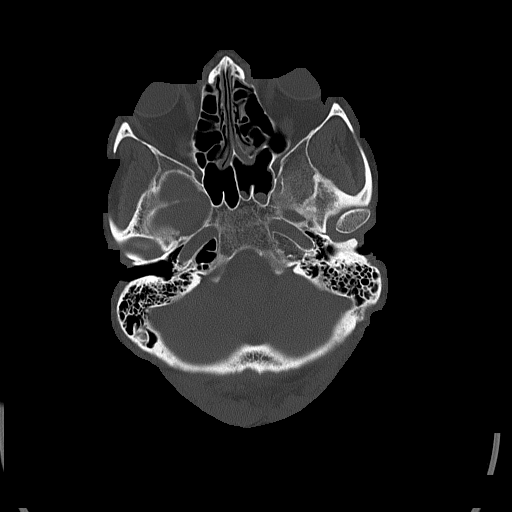
[im 26/86  bone]
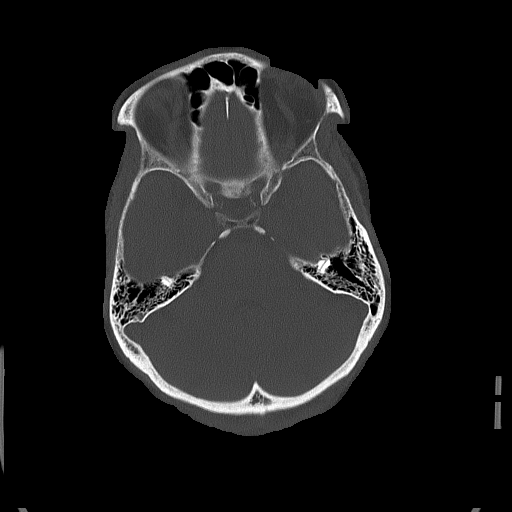

[Series 5: head without cor · coronal · non-contrast · 0.32mm/px · 3 of 67 slices shown]
[im 23/67  brain]
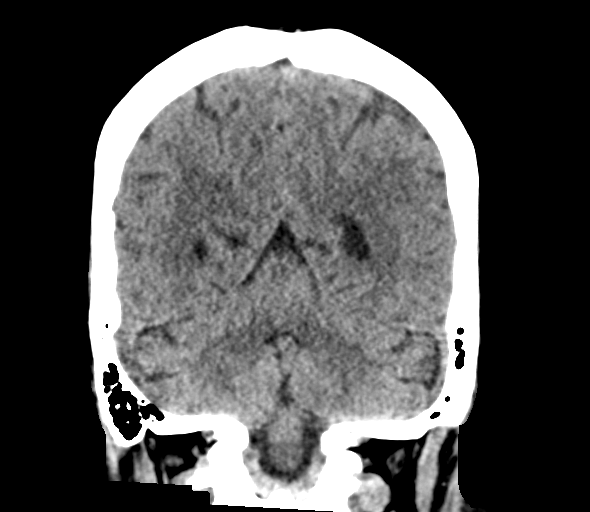
[im 30/67  brain]
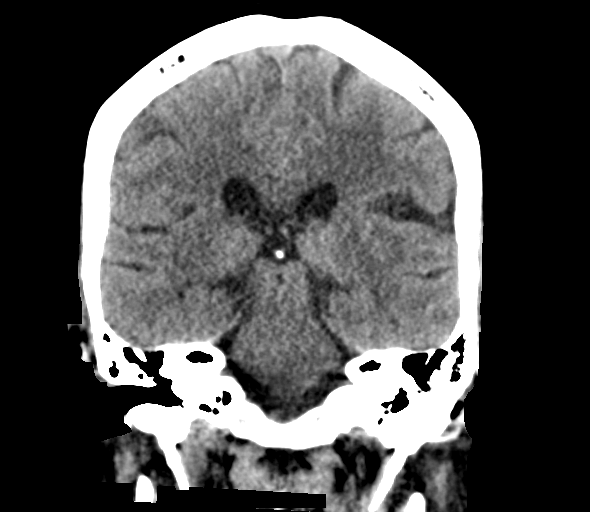
[im 37/67  brain]
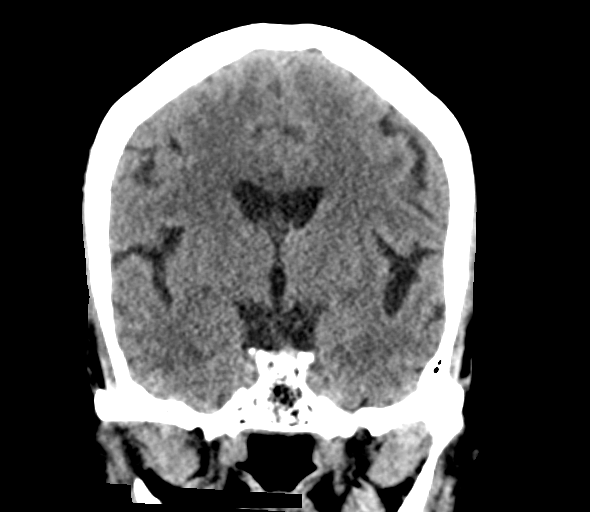

[Series 6: head without sag · sagittal · non-contrast · 0.33mm/px · 3 of 67 slices shown]
[im 23/67  brain]
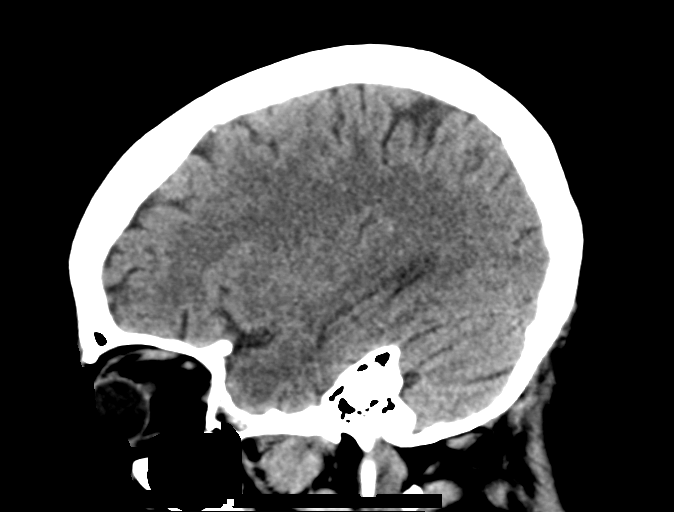
[im 34/67  brain]
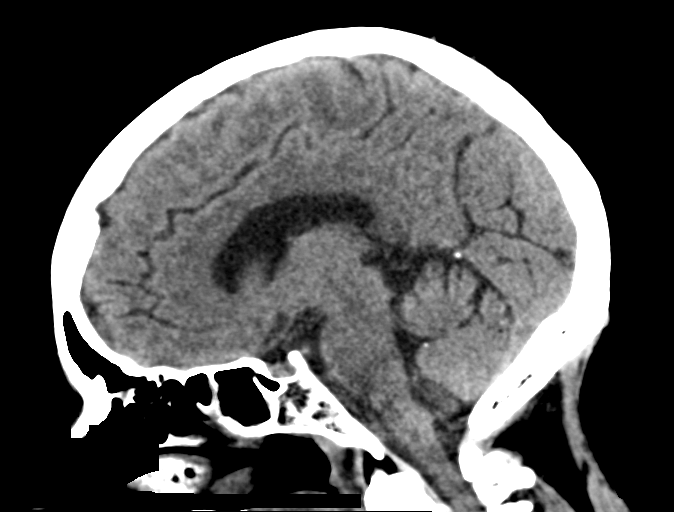
[im 45/67  brain]
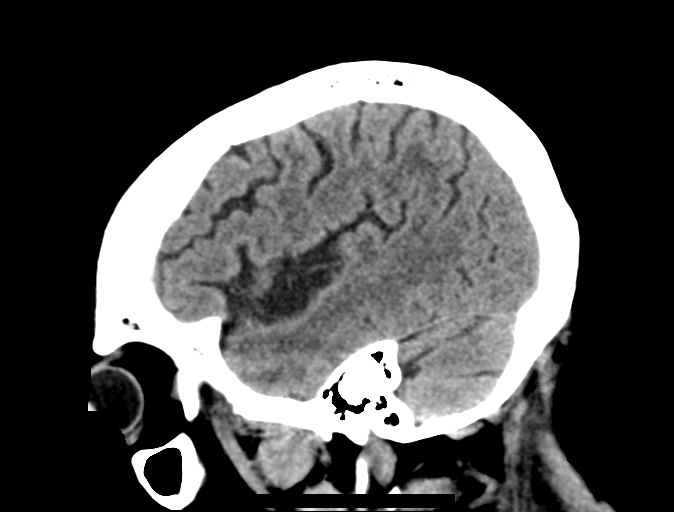

[16 of 47 positions shown; findings below may reference images not displayed]

FINDINGS: Brain: No midline shift, mass effect, or evidence of intracranial
mass lesion. No ventriculomegaly. No acute intracranial hemorrhage
identified.

Probable sulcal variation rather than chronic cortical
encephalomalacia in the right parietal lobe (series 3, image 27).
Similar finding in the left superior frontal gyrus on image 29.

Mild to moderate for age patchy bilateral white matter hypodensity.
Additionally, there is a stable focal area of chronic
encephalomalacia in the body of the corpus callosum (sagittal image
31).

Gray-white matter differentiation otherwise within normal limits.

Vascular: Mild Calcified atherosclerosis at the skull base. No
suspicious intracranial vascular hyperdensity.

Skull: Negative.

Sinuses/Orbits: Mild paranasal sinus mucosal thickening. Tympanic
cavities and mastoids are clear. Patient is intubated.

Other: Visualized orbits and scalp soft tissues are within normal
limits.
IMPRESSION: 1. No acute intracranial abnormality.
2. Moderate for age cerebral white matter changes, including chronic
involvement of the corpus callosum. This is nonspecific but most
commonly due to chronic small vessel disease.

## 2022-04-02 IMAGING — DX DG CHEST 1V PORT
1 series · 1 of 1 positions shown · non-contrast
Comparison: Chest radiograph dated 02/28/[address] dated
02/28/2020

CLINICAL DATA: 54-year-old female status post tracheostomy.

EXAM:
PORTABLE CHEST 1 VIEW

[chest ap]
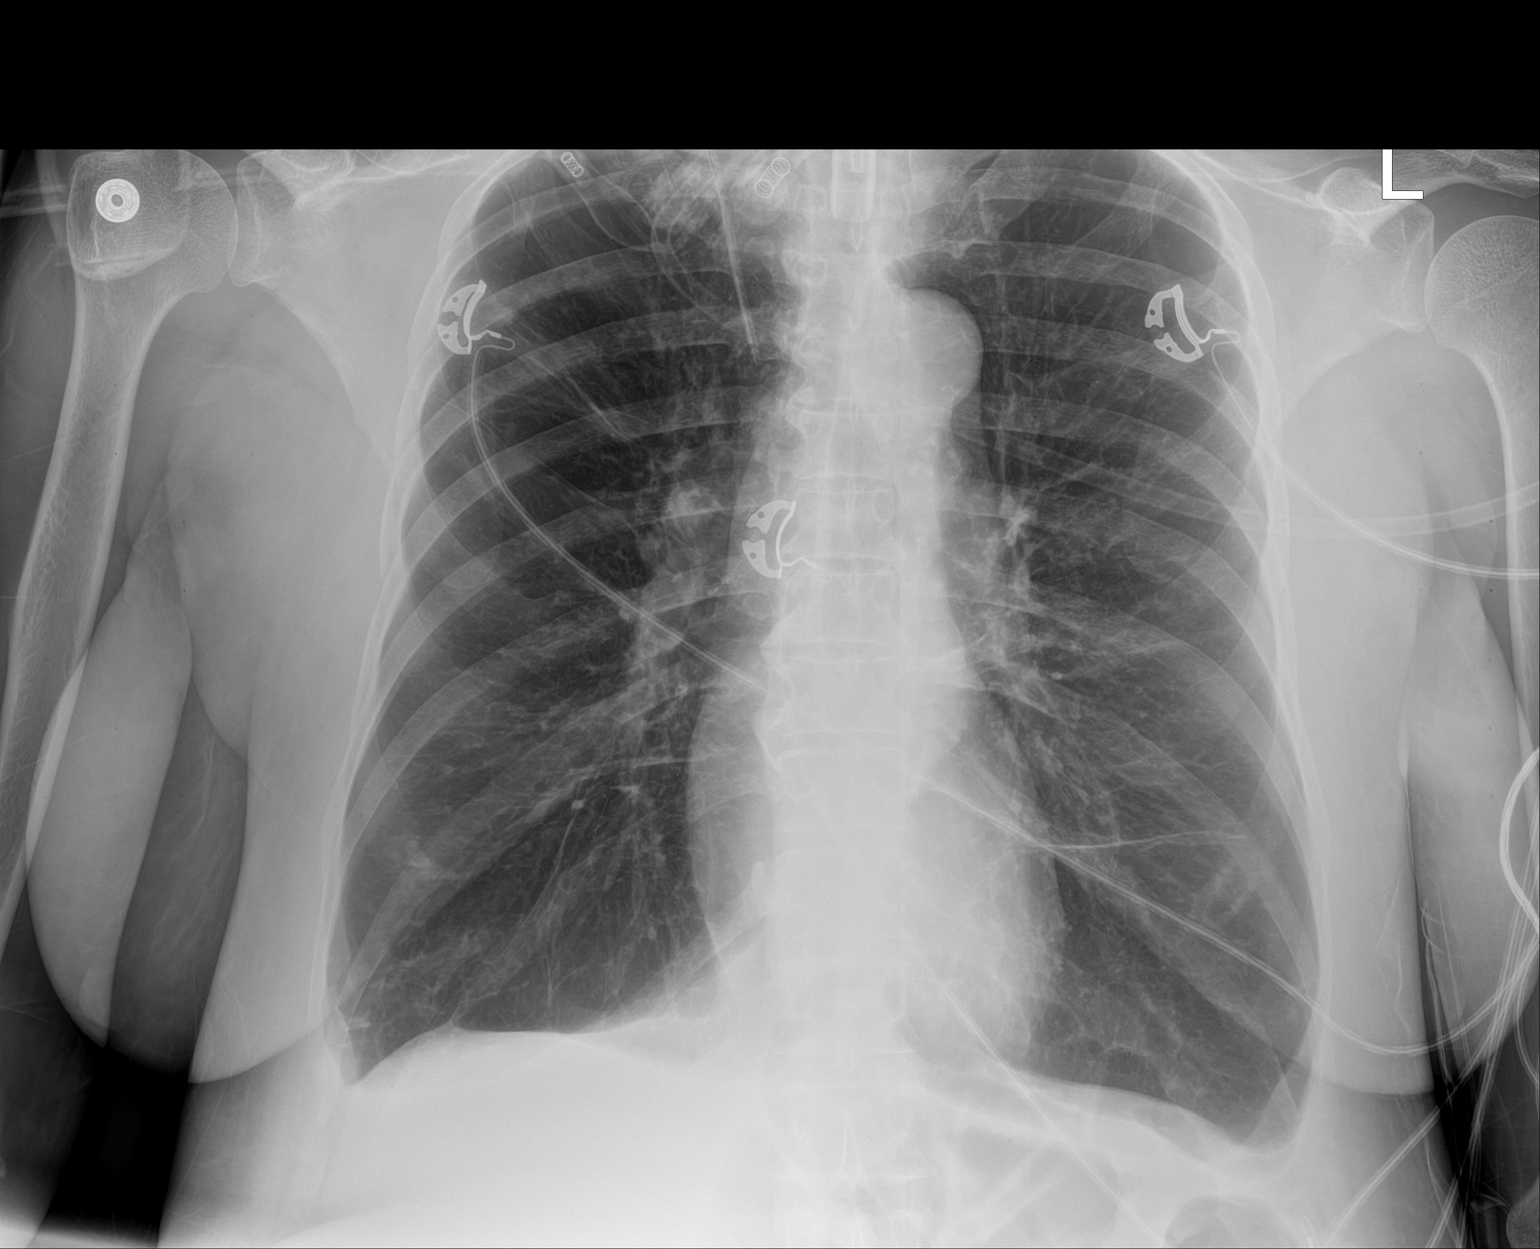

[1 of 1 positions shown; findings below may reference images not displayed]

FINDINGS: Tracheostomy with tip approximately 6 cm above the carina. Right IJ
central venous line in similar position. Background of emphysema. No
focal consolidation, pleural effusion or pneumothorax. Small
scattered nodular densities in the right lung as seen on the prior
CT. The cardiac silhouette is within limits. Atherosclerotic
calcification of the aorta. No acute osseous pathology.
IMPRESSION: 1. Tracheostomy above the carina.
2. No interval change in the appearance of the lungs.

## 2022-04-03 IMAGING — DX DG ABDOMEN 1V
1 series · 2 of 2 positions shown · non-contrast
Comparison: Same day radiograph, [REDACTED] 2022

CLINICAL DATA: Enteric tube placement

EXAM:
ABDOMEN - 1 VIEW

[Series 1: abdomen kub · 0.14mm/px · 2 of 2 slices shown]
[im 1/2]
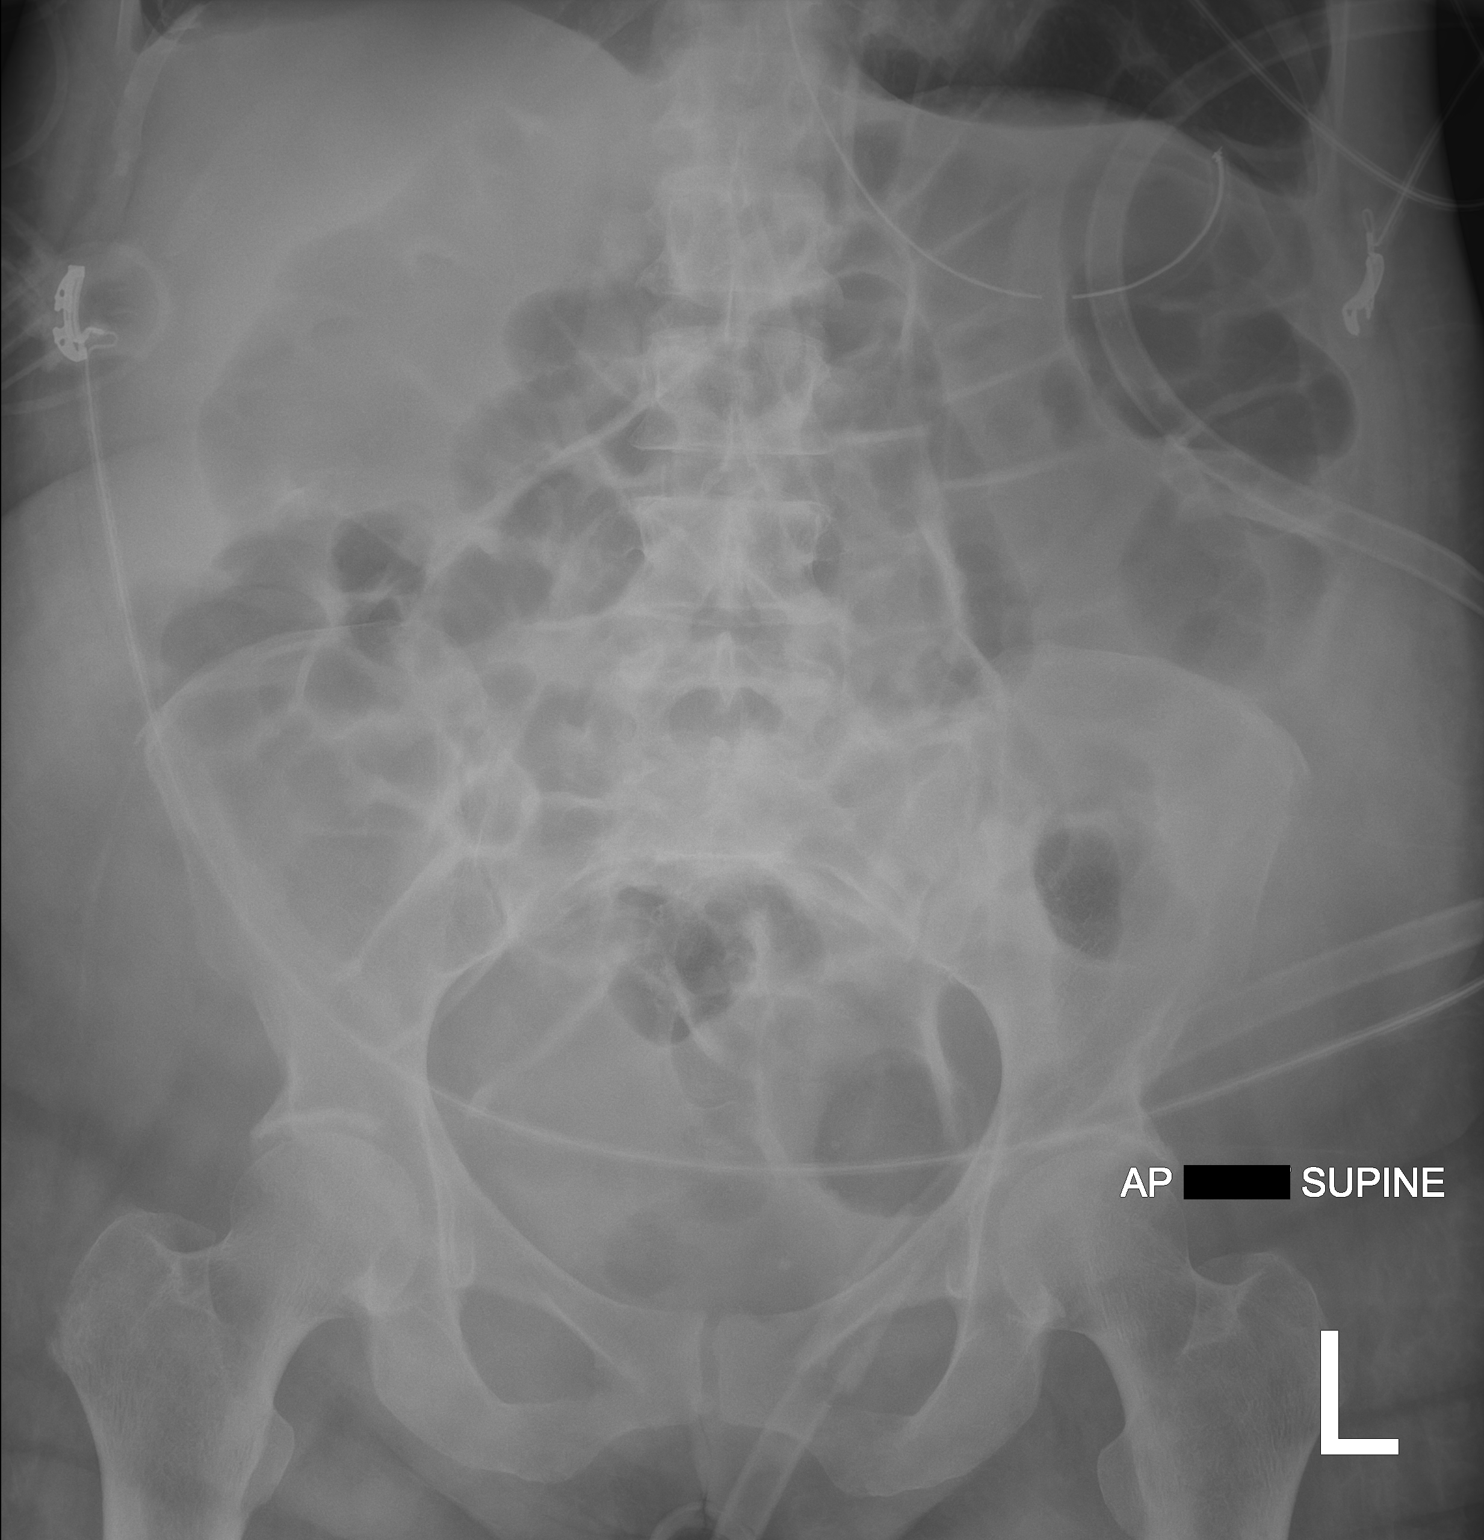
[im 2/2]
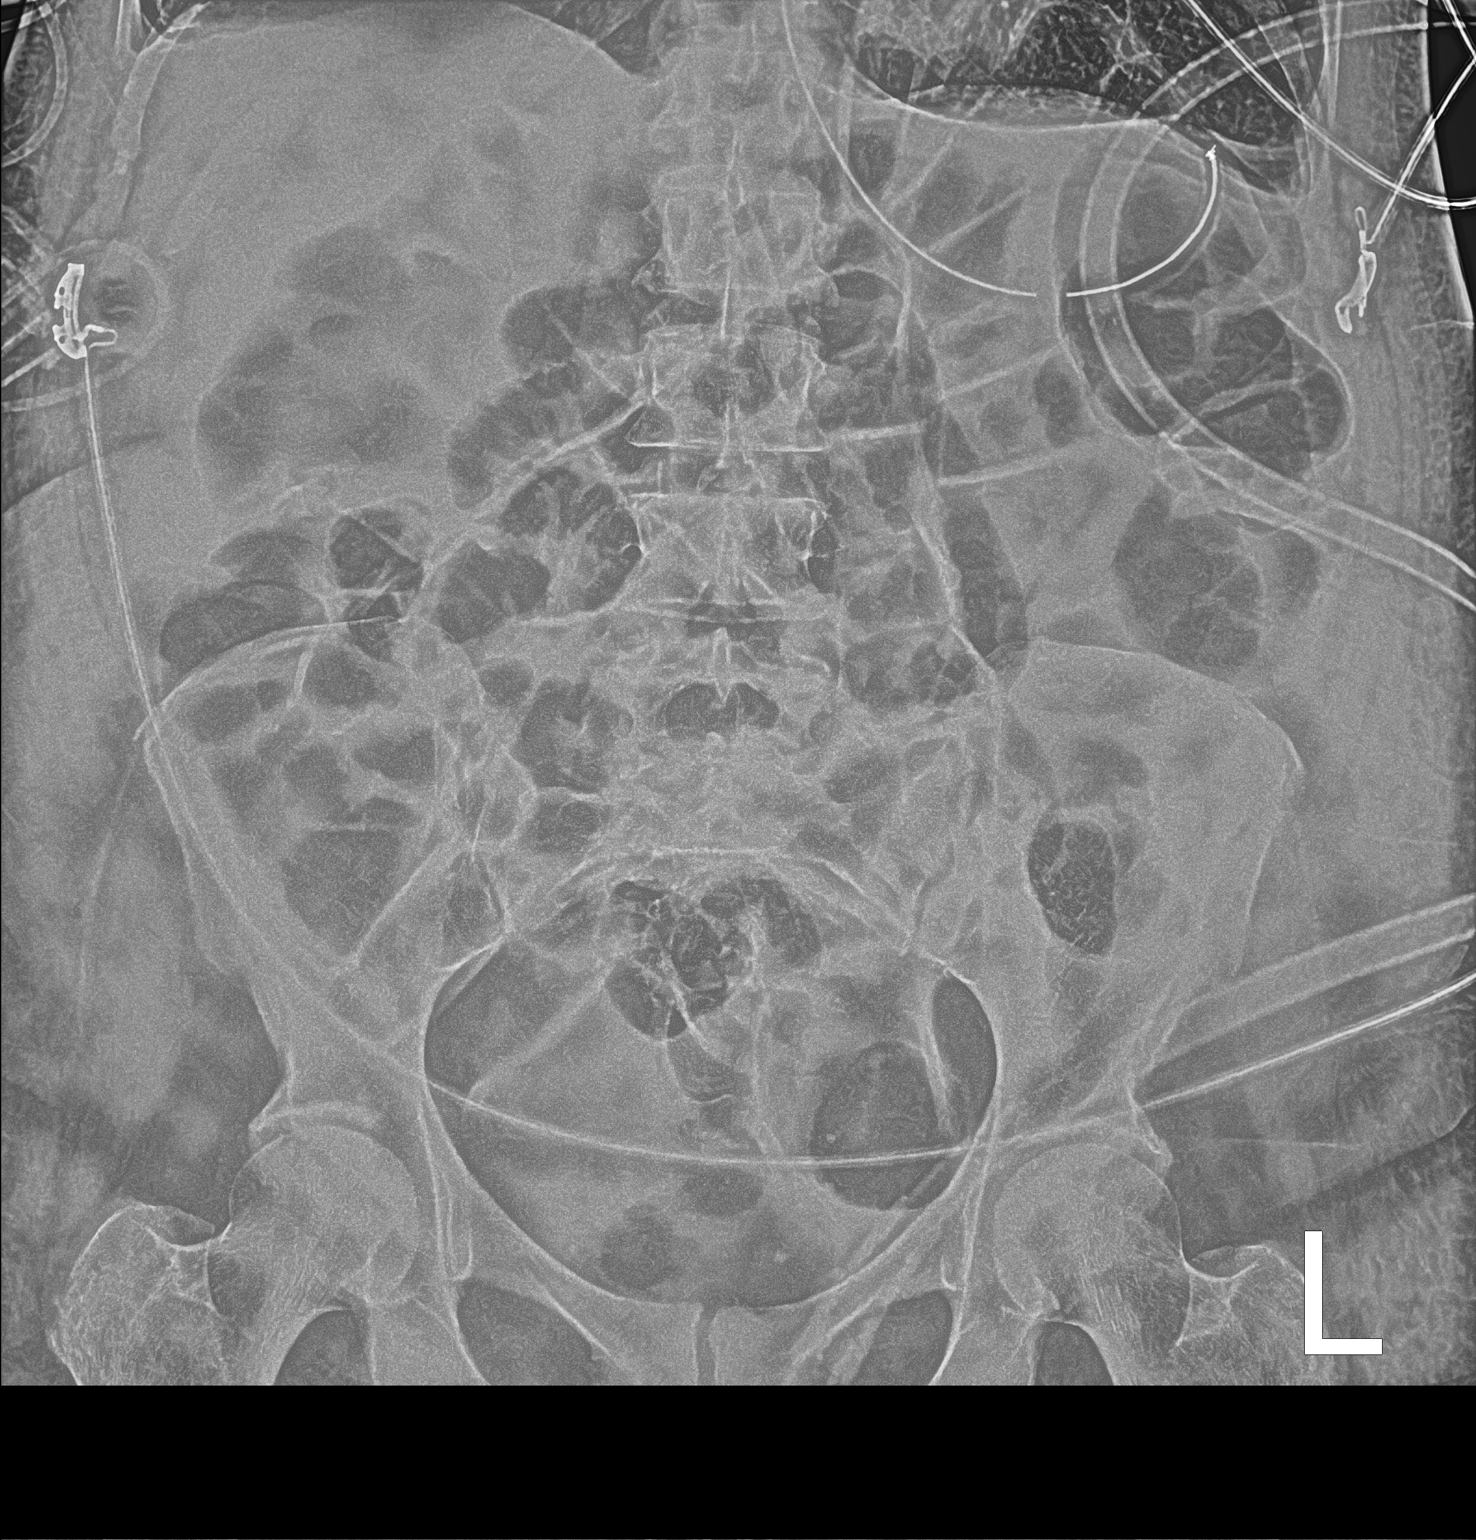

[2 of 2 positions shown; findings below may reference images not displayed]

FINDINGS: Carina is not visualized. Enteric tube side port and tip project
over the expected region of the stomach. Mild diffuse gaseous
distension of bowel without frank evidence of obstruction. Mild
degenerative changes of the lumbar spine.
IMPRESSION: Enteric tube tip and side port project over the expected region of
the stomach. However the carina is not visualized on provided
radiographs. Consider chest radiograph to ensure appropriate
placement prior to enteric tube utilization if clinically indicated.

## 2022-04-03 IMAGING — DX DG ABDOMEN 1V
1 series · 2 of 2 positions shown · non-contrast
Comparison: February 28, 2020

CLINICAL DATA: Abdominal pain and constipation

EXAM:
ABDOMEN - 1 VIEW

[Series 1: abdomen · 0.14mm/px · 2 of 2 slices shown]
[im 1/2]
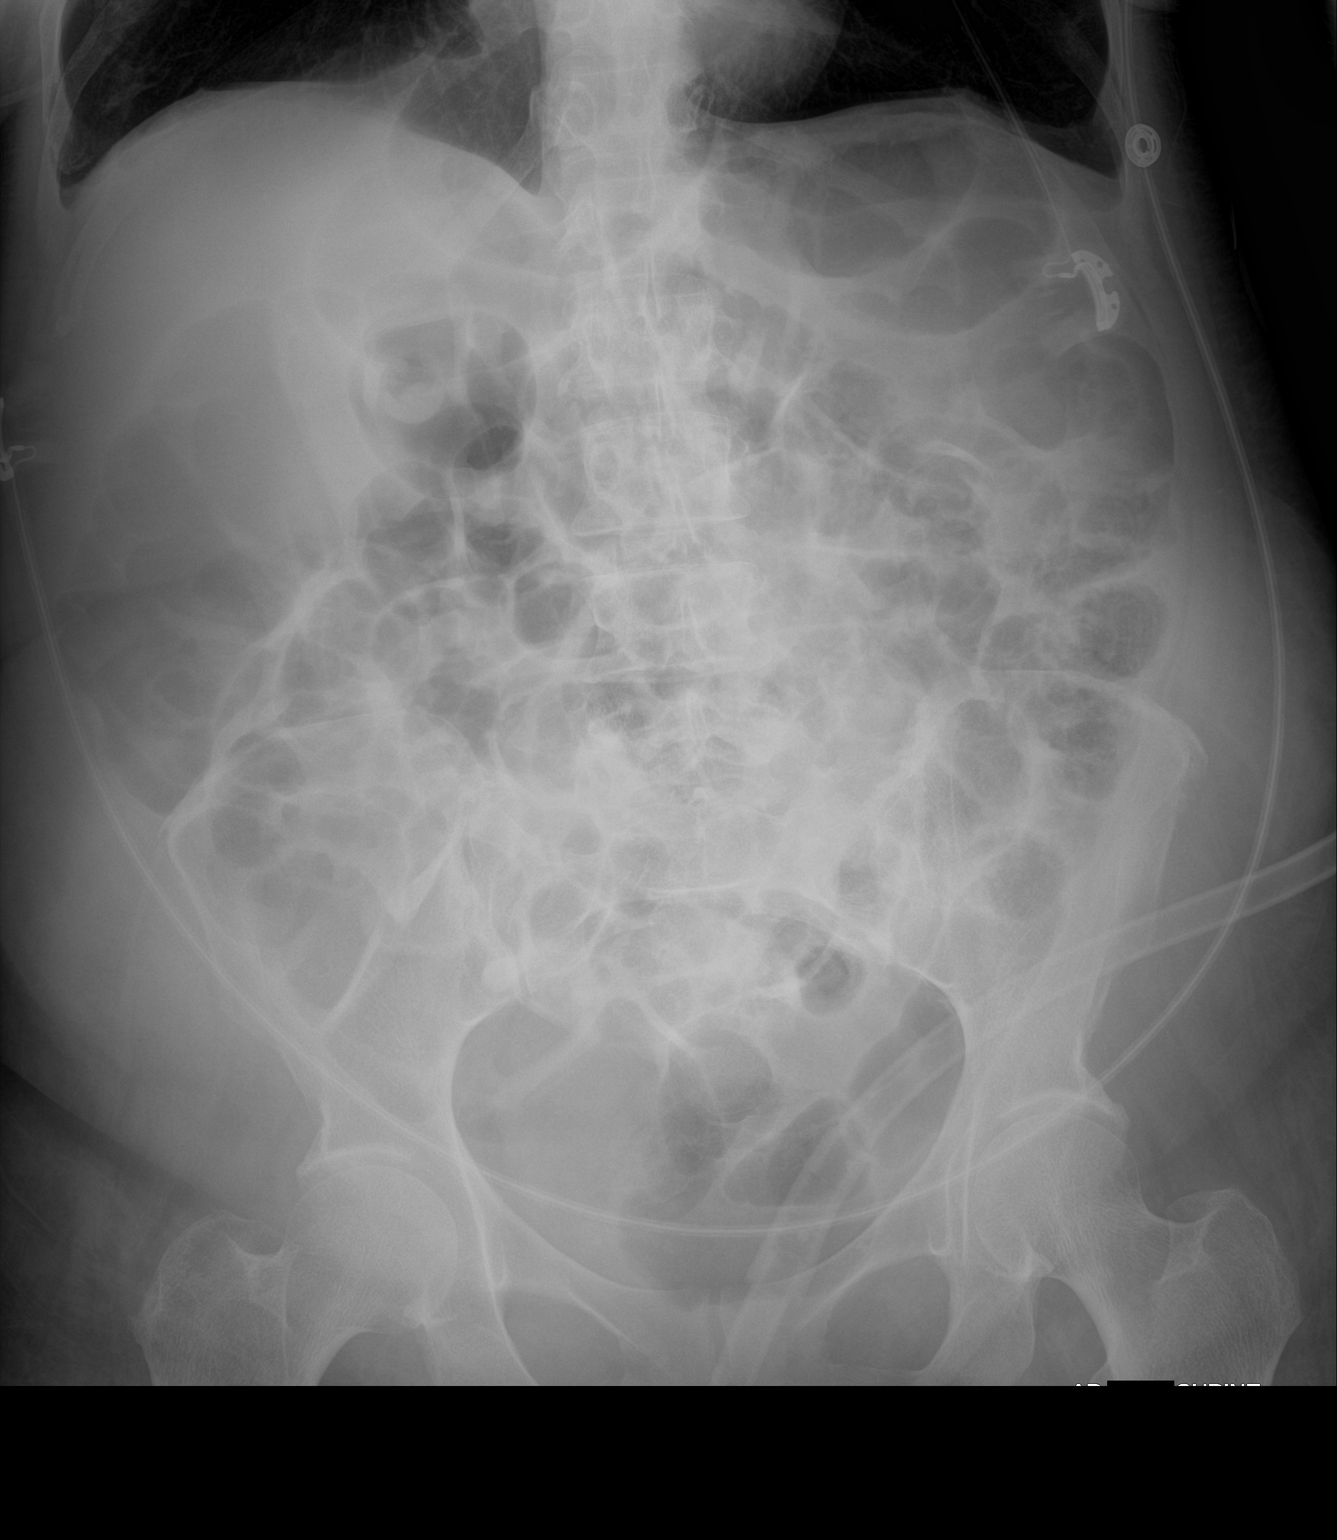
[im 2/2]
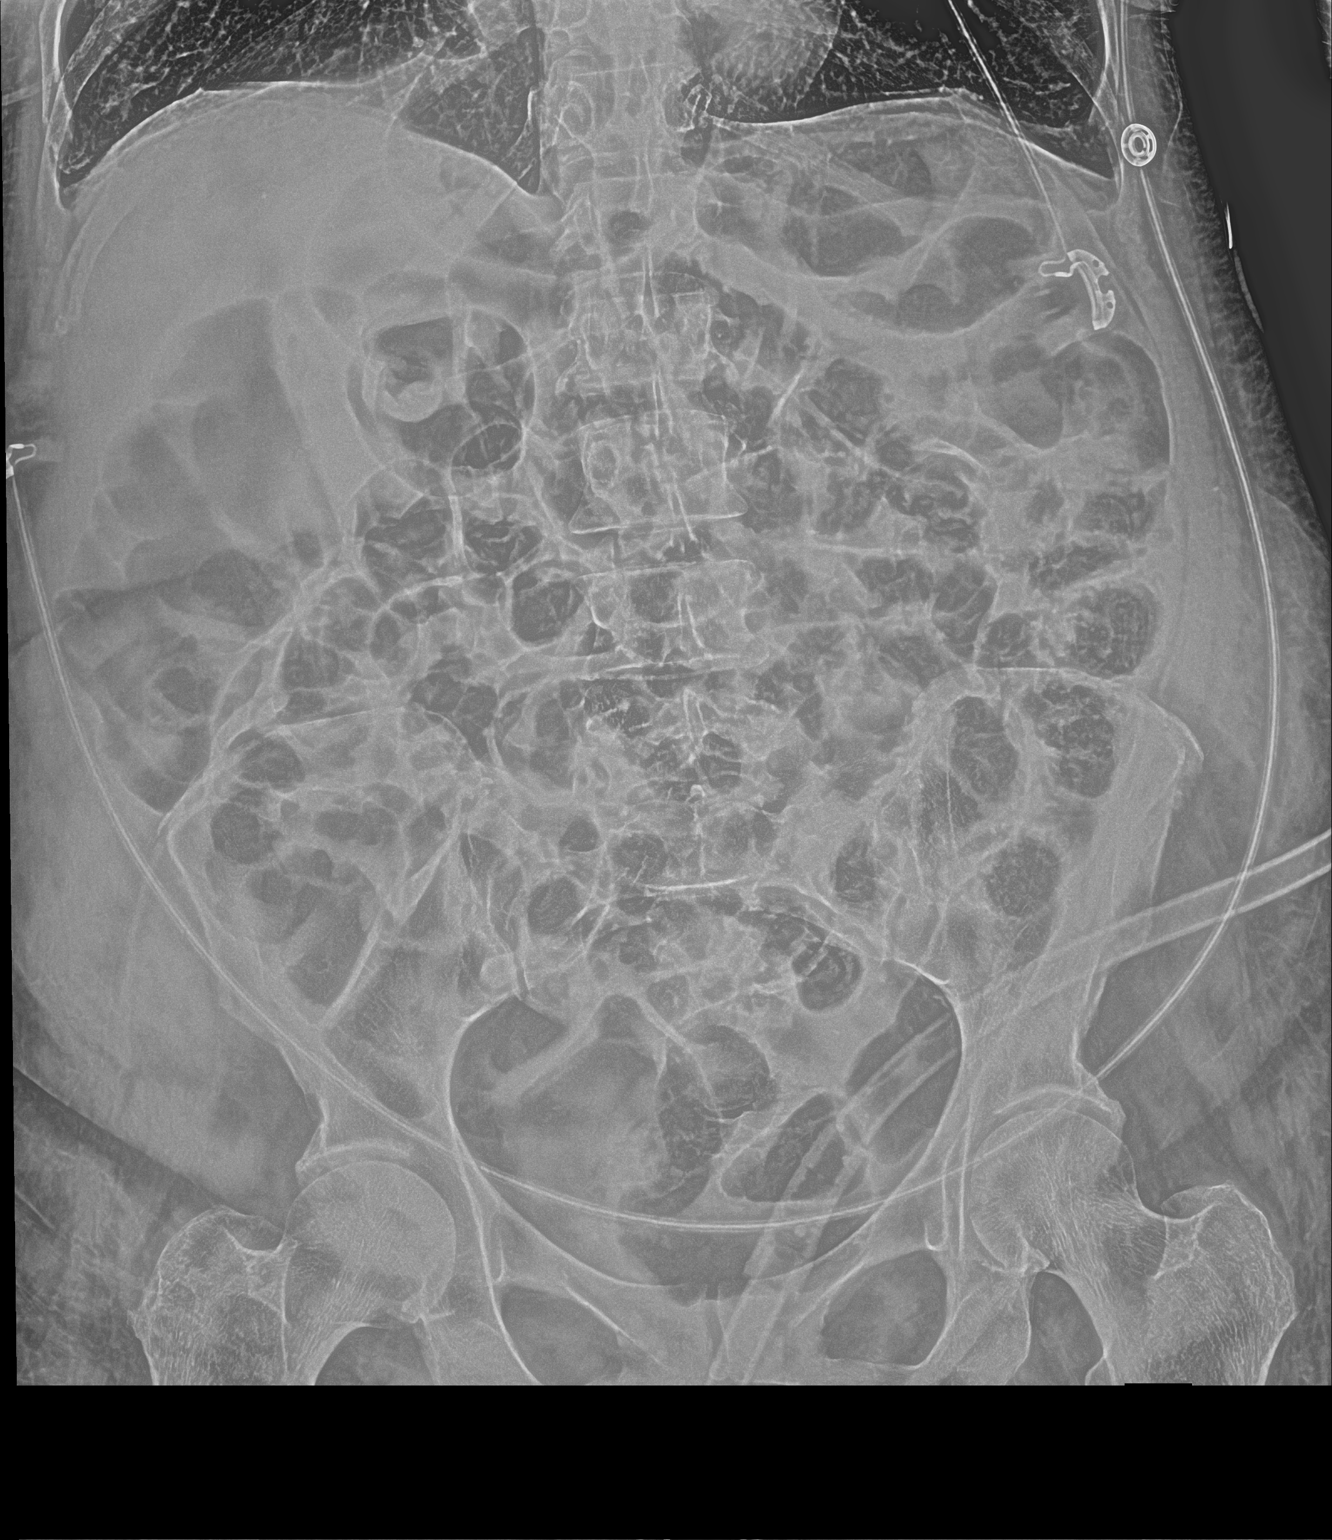

[2 of 2 positions shown; findings below may reference images not displayed]

FINDINGS: Nasogastric tube no longer apparent. There is relatively mild stool
in the colon. There is no bowel dilatation or air-fluid level to
suggest bowel obstruction. No free air. No abnormal calcifications.
Visualized lung bases clear.
IMPRESSION: Nasogastric tube no longer apparent. No bowel obstruction or free
air. Fairly mild stool volume in colon. Lung bases clear.

## 2022-04-05 IMAGING — DX DG ABD PORTABLE 1V
1 series · 1 of 1 positions shown · non-contrast
Comparison: 03/03/2020

CLINICAL DATA: Feeding tube placement.

EXAM:
PORTABLE ABDOMEN - 1 VIEW

[abdomen]
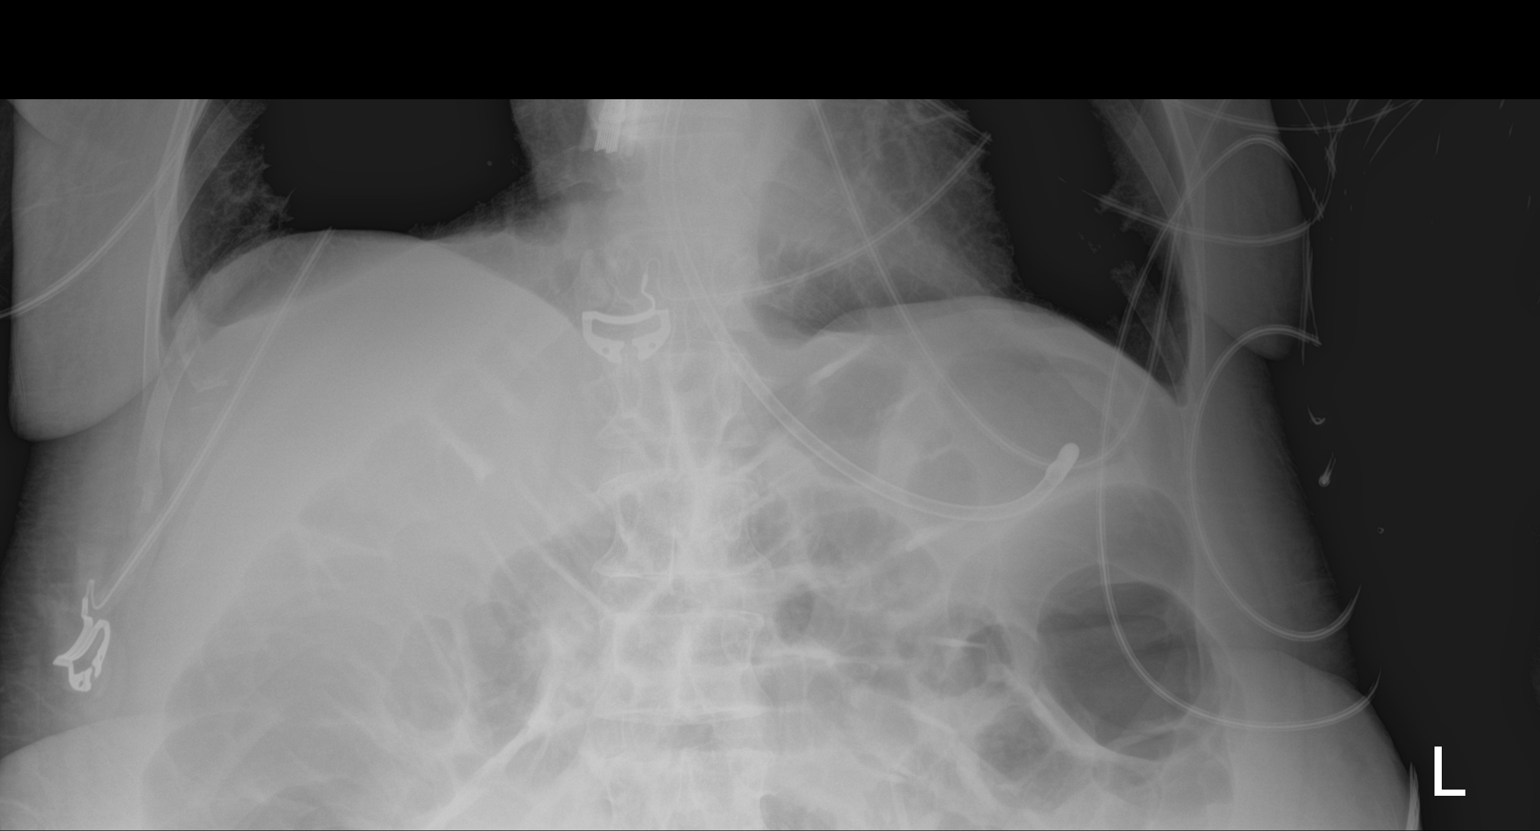

[1 of 1 positions shown; findings below may reference images not displayed]

FINDINGS: A new feeding tube is seen with tip overlying the gastric fundus.
Dilated small bowel loops and transverse colon again noted in the
visualized upper abdomen, similar to prior study.
IMPRESSION: New feeding tube, with tip overlying the gastric fundus.

## 2022-04-07 IMAGING — DX DG ABDOMEN 1V
1 series · 1 of 1 positions shown · non-contrast
Comparison: Multiple prior studies most recent comparison chest
x-ray from the same date and abdominal radiograph from March 05, 2020

CLINICAL DATA: Abdominal distension and abnormal bowel sounds.

EXAM:
ABDOMEN - 1 VIEW

[abdomen]
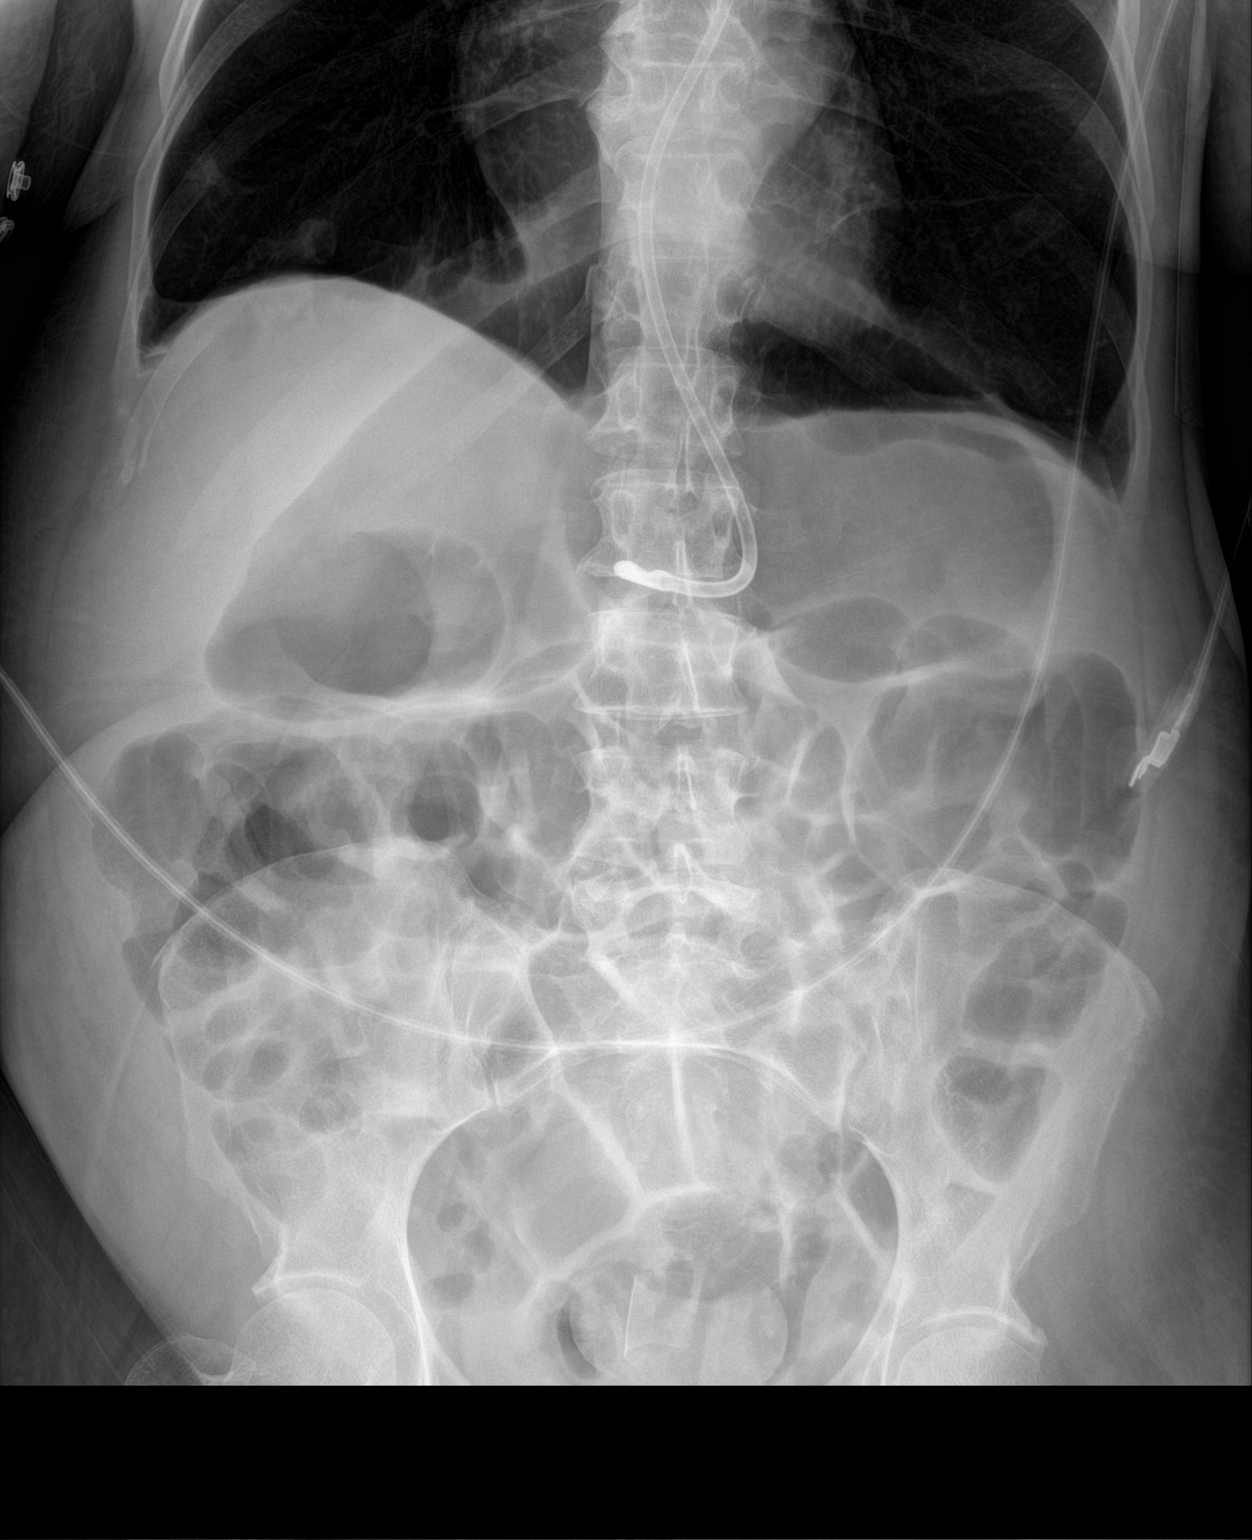

[1 of 1 positions shown; findings below may reference images not displayed]

FINDINGS: Feeding tube, tip in the mid stomach. Moderate to marked gastric
distension.

Potential placement of rectal management tube as well. Perhaps
slightly less distension of the colon compared to previous studies.
Diffuse small bowel distension as well, distended with gas.

Lung bases are clear.

No acute skeletal process on limited assessment.
IMPRESSION: 1. Feeding tube tip in the mid stomach.
2. Moderate to marked gastric distension.
3. Persistent diffuse gaseous distension of both large and small
bowel may reflect global ileus. Perhaps slightly less distension of
the colon when compared to the limited abdominal radiograph from the
[REDACTED]. Potential placement of rectal management tube projecting over the
pelvis.

## 2022-04-07 IMAGING — DX DG CHEST 1V
1 series · 1 of 1 positions shown · non-contrast
Comparison: 03/02/2020

CLINICAL DATA: Tracheostomy

EXAM:
CHEST  1 VIEW

[chest ap]
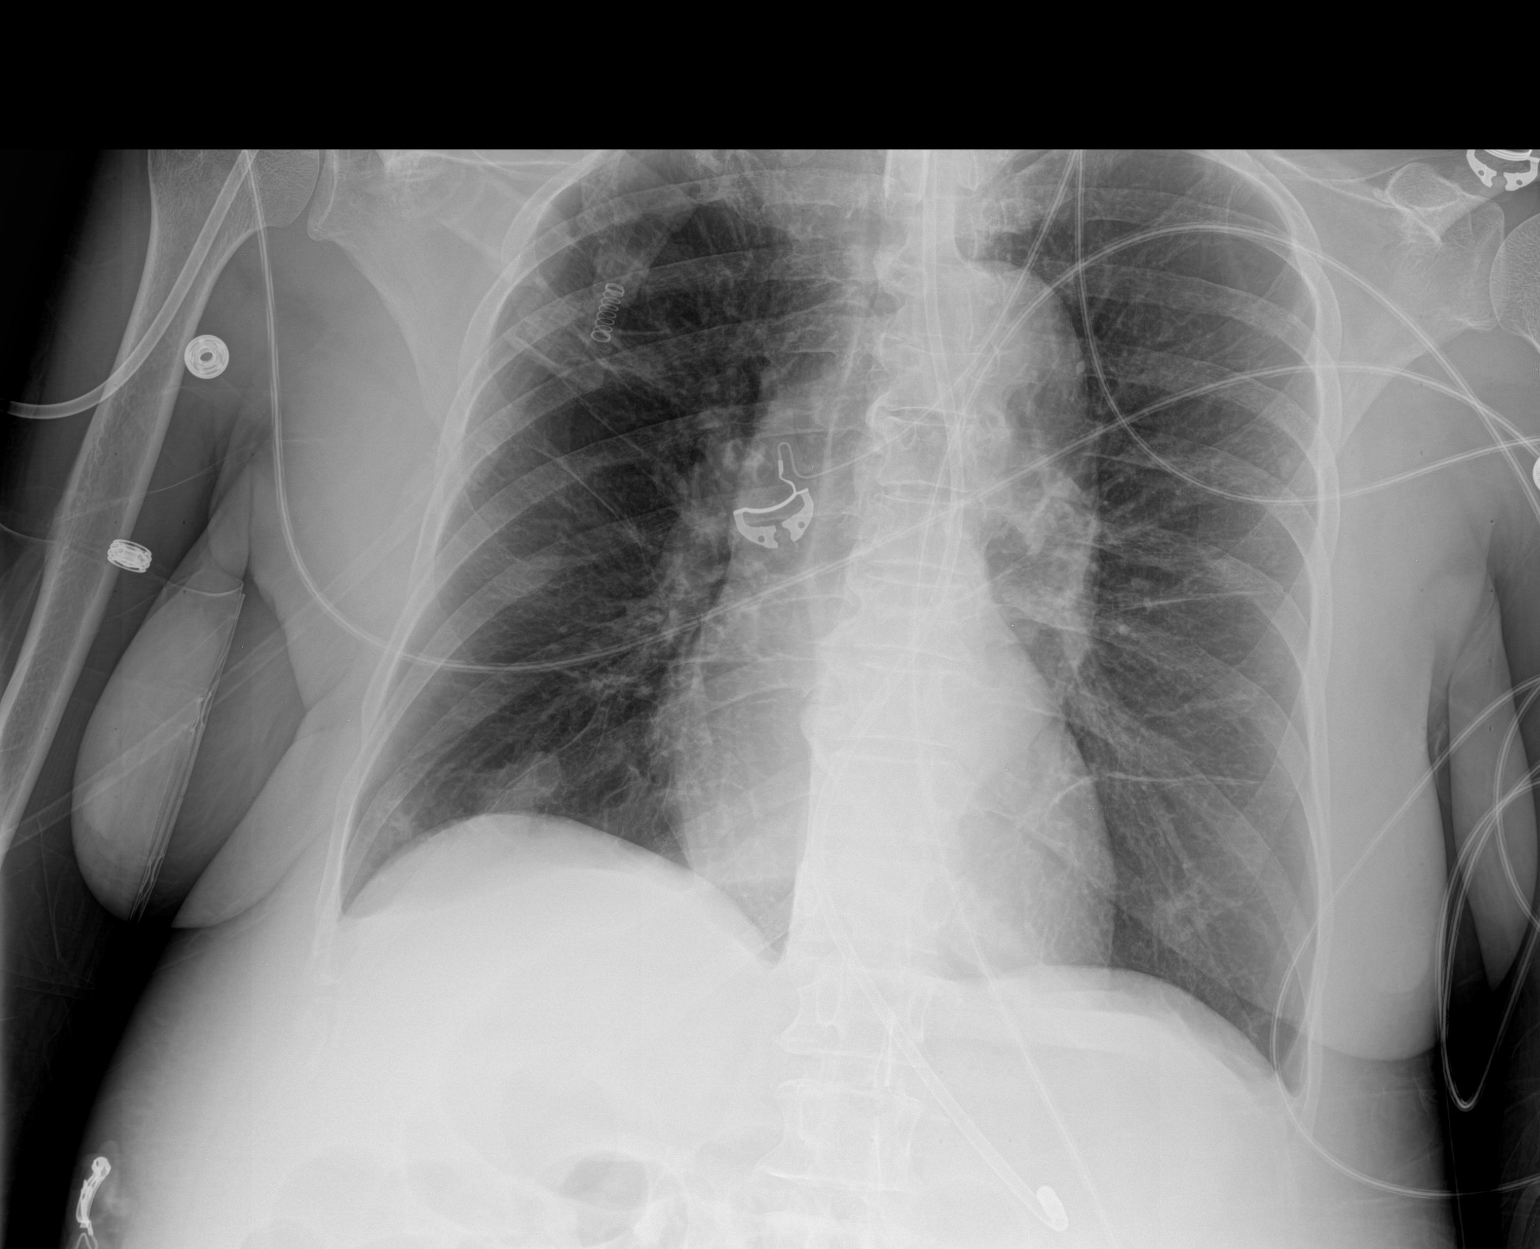

[1 of 1 positions shown; findings below may reference images not displayed]

FINDINGS: Tracheostomy is unchanged. Nasoenteric feeding tube with its tip
overlying the proximal body of the stomach is noted. Pulmonary
insufflation has improved and is now normal and symmetric. There are
scattered nodular infiltrates noted within the lung bases
bilaterally and asymmetrically within the mid lung zones bilaterally
which are nonspecific, but may be infectious or inflammatory in
nature. No pneumothorax or pleural effusion. Cardiac size within
normal limits. No acute bone abnormality.
IMPRESSION: Tracheostomy in expected position. Nasoenteric feeding tube tip
within the proximal stomach.

Improved pulmonary volumes. Development of nodular scattered
infiltrates, possibly infectious or inflammatory.

## 2022-04-08 IMAGING — DX DG CHEST 1V PORT
1 series · 1 of 1 positions shown · non-contrast
Comparison: March 07, 2020

CLINICAL DATA: Respiratory failure

EXAM:
PORTABLE CHEST 1 VIEW

[chest]
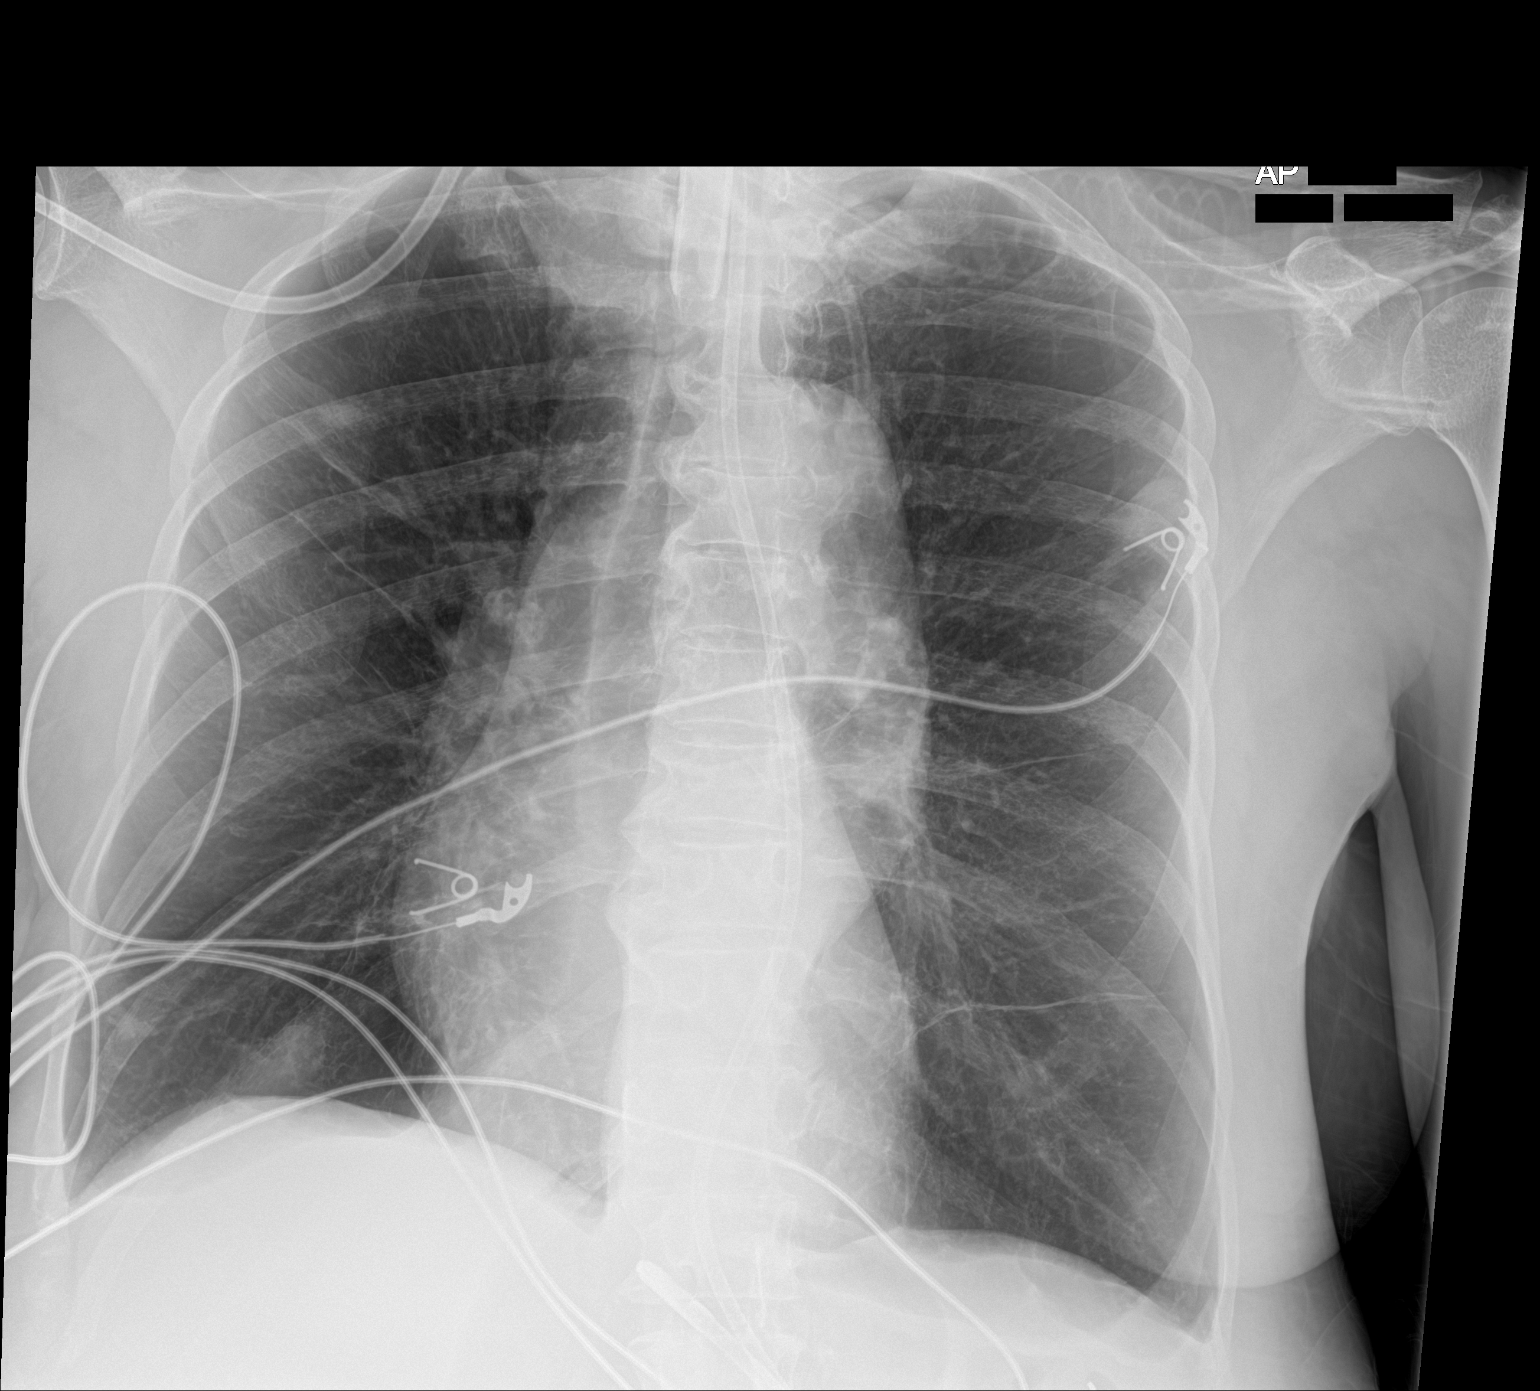

[1 of 1 positions shown; findings below may reference images not displayed]

FINDINGS: Tracheostomy catheter tip is 7.4 cm above the carina. Feeding tube
tip in stomach. No pneumothorax. There there is mild right upper
lobe and left lower lobe atelectasis. No edema or airspace opacity.
Heart size and pulmonary vascular normal. Aorta is mildly tortuous,
stable. No bone lesions.
IMPRESSION: Tube positions as described without pneumothorax. Areas of scattered
atelectatic change. No edema or airspace opacity. Stable cardiac
silhouette.

## 2022-04-08 IMAGING — DX DG ABD PORTABLE 1V
1 series · 1 of 1 positions shown · non-contrast
Comparison: None.

CLINICAL DATA: Enteric tube placement

EXAM:
PORTABLE ABDOMEN - 1 VIEW

[abdomen]
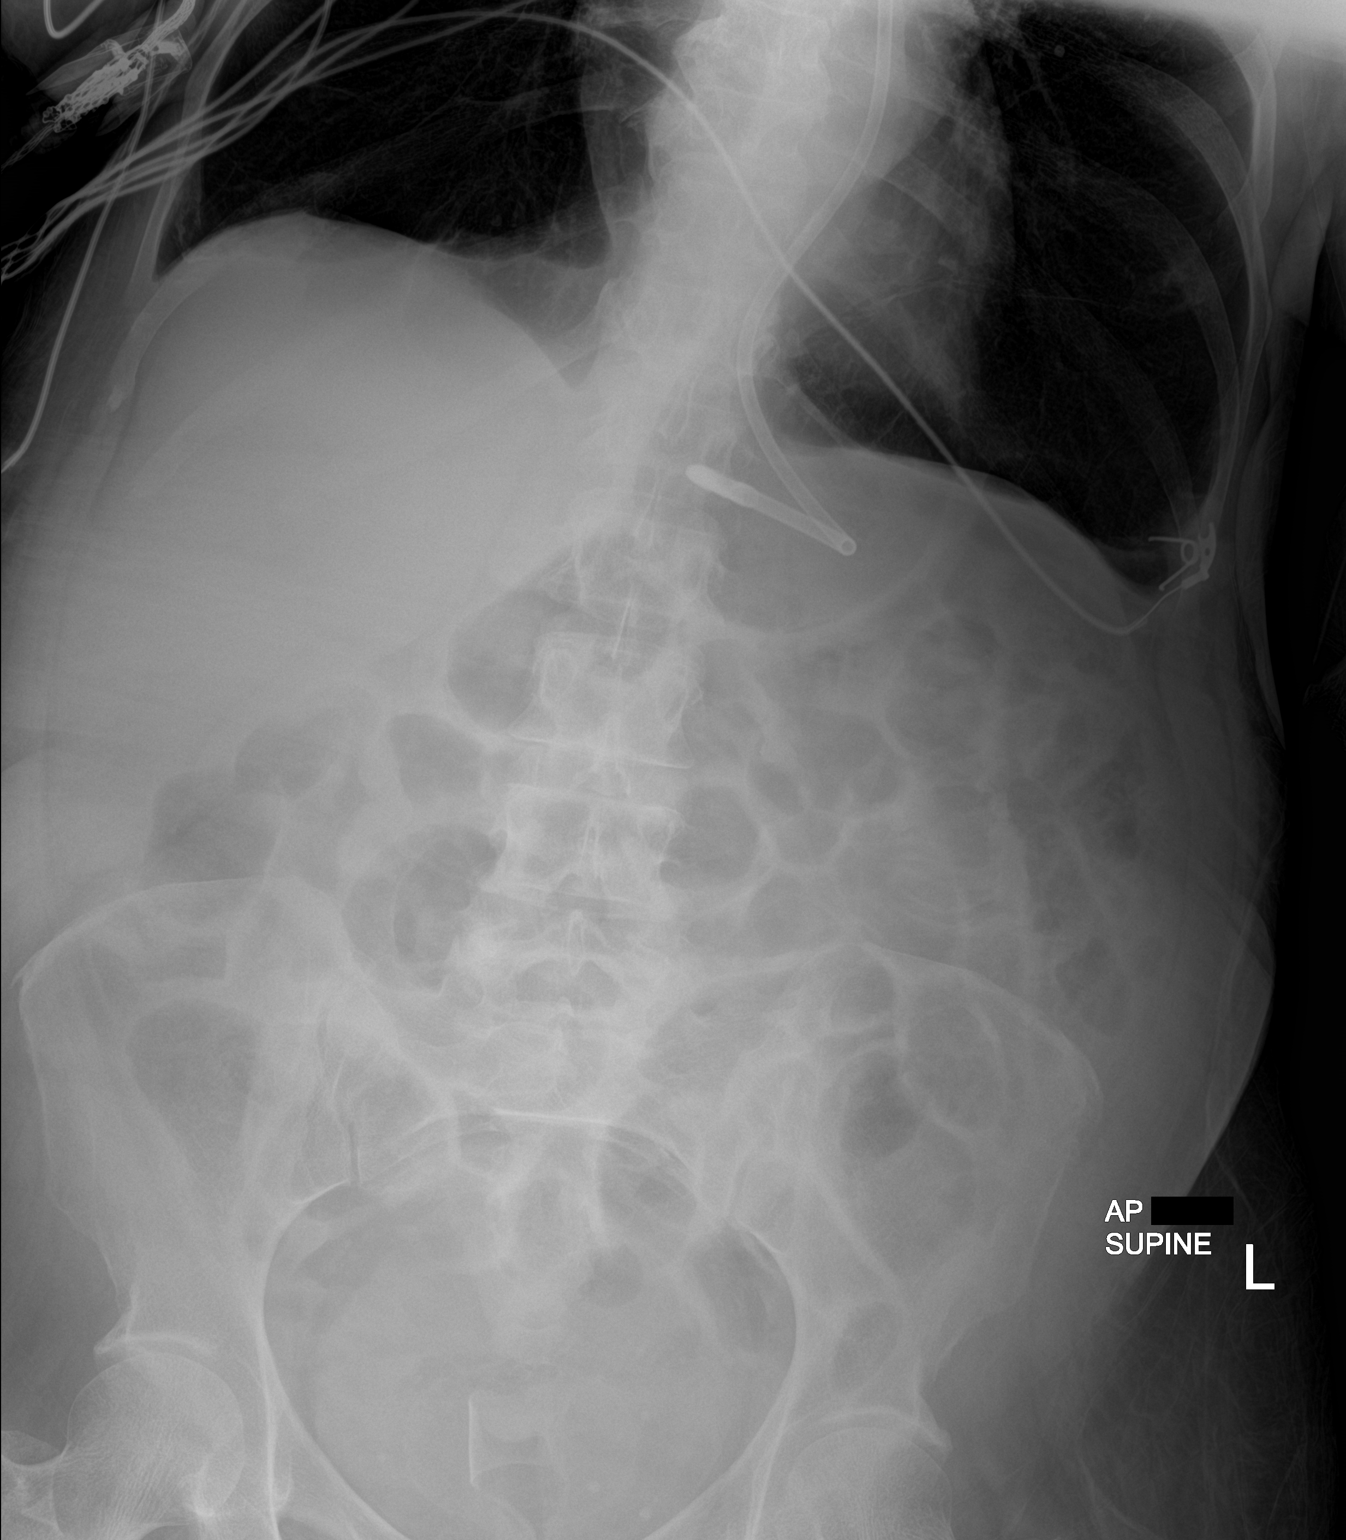

[1 of 1 positions shown; findings below may reference images not displayed]

FINDINGS: Enteric tube tip is in the mid body of the stomach. There is
evidence of a hiatal hernia. There is no bowel dilatation or
air-fluid level to suggest bowel obstruction. No free air. Lung
bases clear.
IMPRESSION: Enteric tube tip in proximal stomach. Hiatal hernia evident. No
bowel obstruction or free air appreciable.

## 2022-04-13 IMAGING — DX DG CHEST 1V PORT
1 series · 1 of 1 positions shown · non-contrast
Comparison: 03/08/2020.  CT 02/28/2020.

CLINICAL DATA: Respiratory failure.

EXAM:
PORTABLE CHEST 1 VIEW

[chest ap]
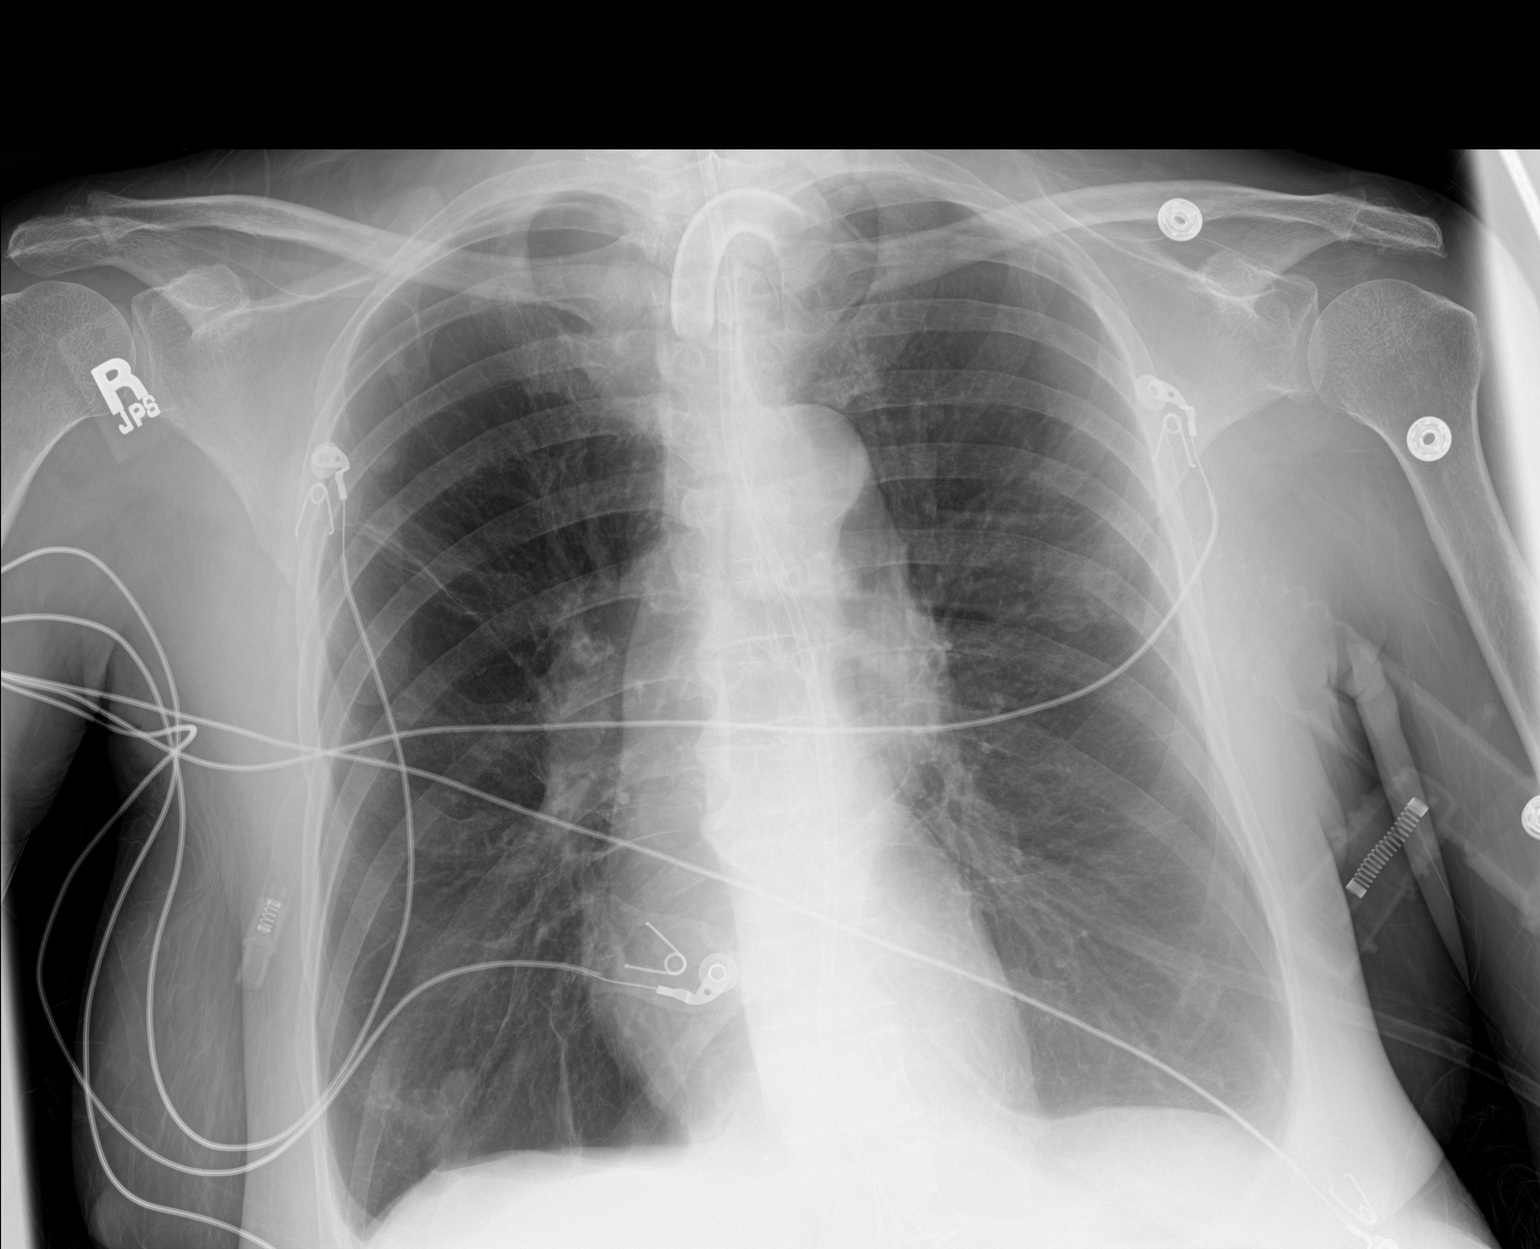

[1 of 1 positions shown; findings below may reference images not displayed]

FINDINGS: Tracheostomy tube feeding tube in stable position. Heart size
normal. Stable right upper lobe pulmonary nodular densities again
noted. These are best identified by prior CT. Nodular opacity noted
the right lung base may represent a nipple shadow, follow-up exam
with nipple markers suggested. Mild atelectatic changes right upper
lung and right base. Tiny bilateral pleural effusions cannot be
excluded. Degenerative change thoracic spine.
IMPRESSION: 1. Tracheostomy tube and feeding tube in stable position.
2. Stable right upper lobe pulmonary nodular densities again noted.
These are best identified by prior CT. Nodular opacity noted over
the right base, possibly a nipple shadow. Follow-up exam with nipple
markers suggested.
3. Mild atelectatic changes right upper lung and right base. Tiny
bilateral pleural effusions cannot be excluded.

## 2022-04-18 IMAGING — DX DG CHEST 1V PORT
1 series · 1 of 1 positions shown · non-contrast
Comparison: March 13, 2020 chest radiograph and chest CT
February 28, 2020

CLINICAL DATA: Shortness of breath

EXAM:
PORTABLE CHEST 1 VIEW

[chest]
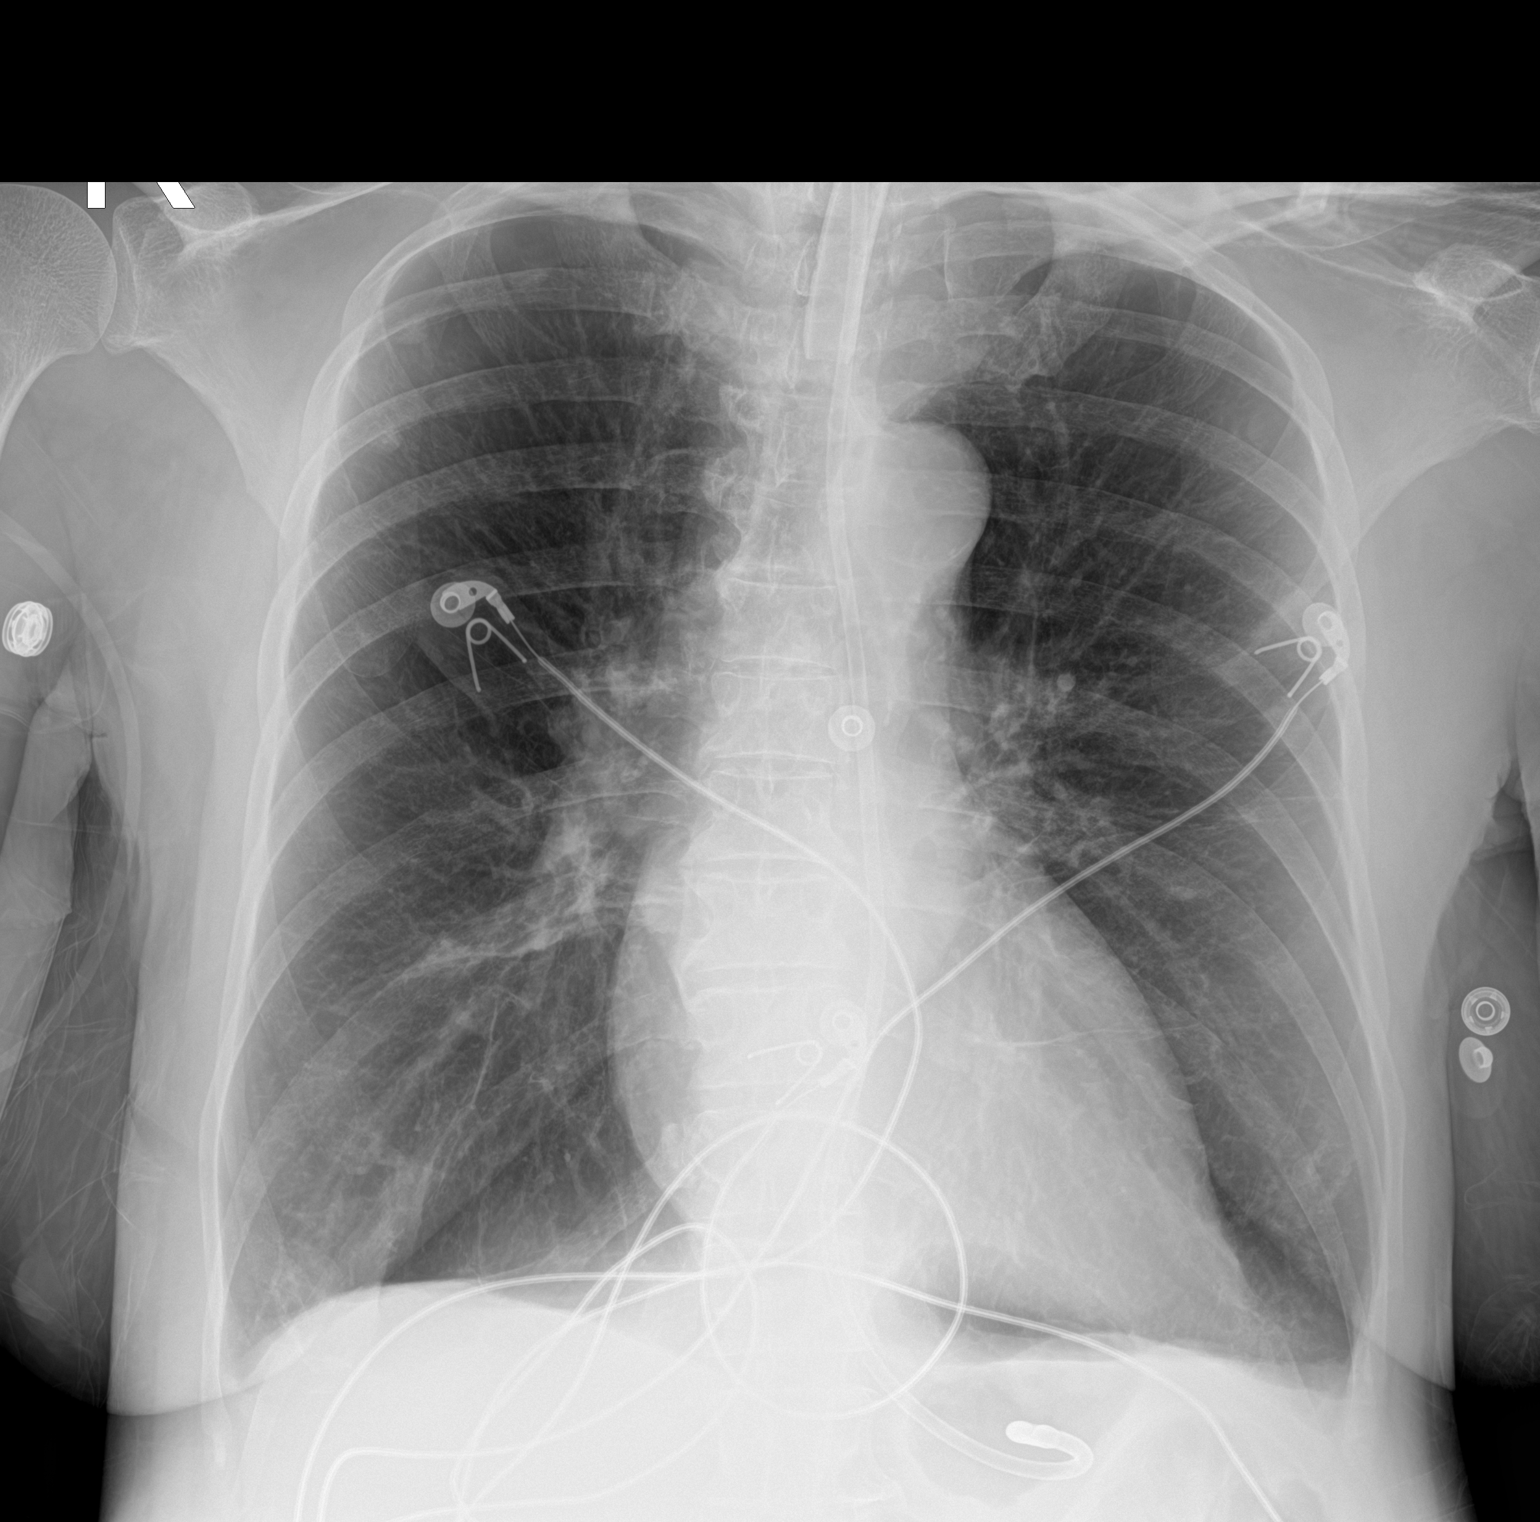

[1 of 1 positions shown; findings below may reference images not displayed]

FINDINGS: Tracheostomy catheter tip is 5.5 cm above the carina. Enteric tube
tip is in the proximal stomach. There is atelectatic change in the
lung bases. Equivocal pleural effusions noted bilaterally. No edema
or airspace opacity. Nodular opacities seen in the right upper lobe,
stable compared to recent studies. Heart size and pulmonary
vascularity are within normal limits. No adenopathy. No bone
lesions.
IMPRESSION: Nodular opacities right upper lobe, unchanged and of uncertain
etiology. Areas of atelectatic change bilaterally. Equivocal pleural
effusions bilaterally. No edema or airspace opacity. Stable cardiac
silhouette. Tube positions as described without evident
pneumothorax.

## 2022-04-19 IMAGING — CT CT HEAD W/O CM
4 series · 16 of 47 positions shown, 18 images · non-contrast
Comparison: Head CT 02/28/2020.

CLINICAL DATA: Mental status change, unknown cause. Sudden change
in mental status.

EXAM:
CT HEAD WITHOUT CONTRAST
TECHNIQUE: Contiguous axial images were obtained from the base of the skull
through the vertex without intravenous contrast.

[Series 3: head bone · axial · 0.43mm/px · z∈[-142,-110]mm · 3 of 78 slices shown]
[im 8/78  bone]
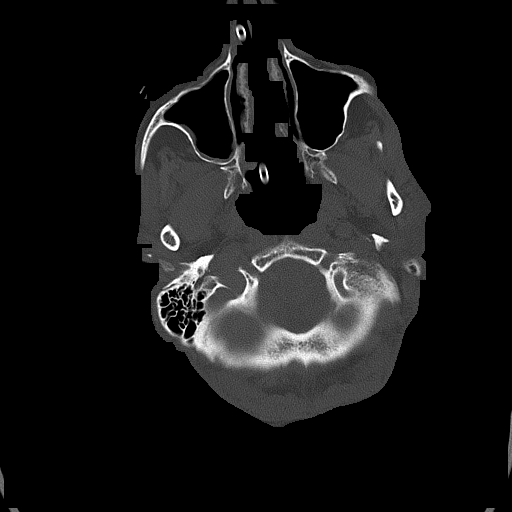
[im 16/78  bone]
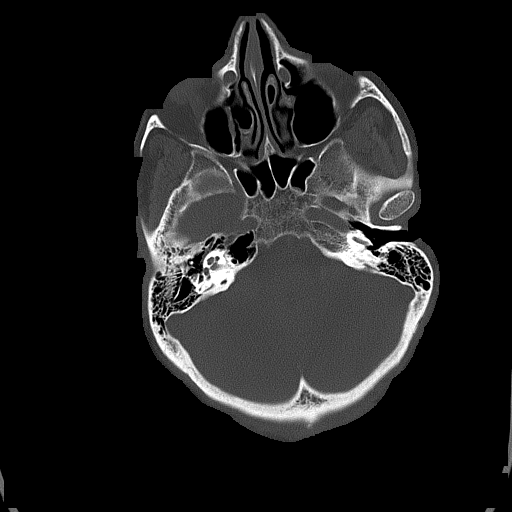
[im 24/78  bone]
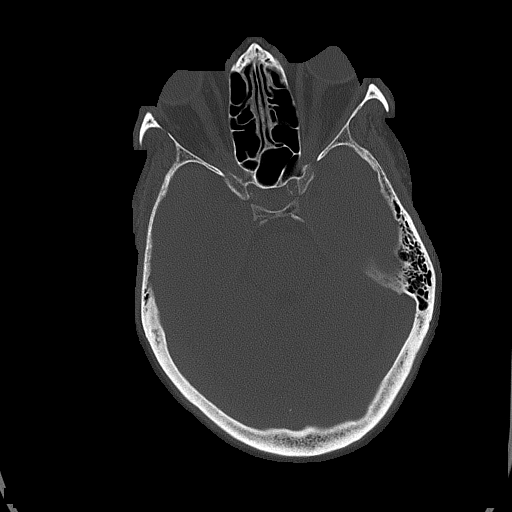

[Series 4: head without · axial · non-contrast · 0.43mm/px · z∈[-140,-20]mm · 7 of 32 slices shown, 9 images]
[im 4/32  brain]
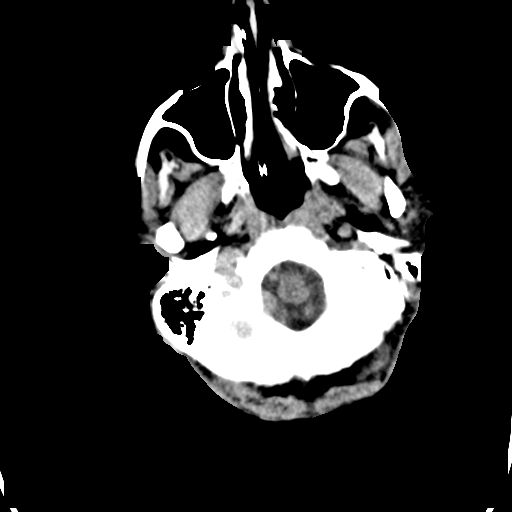
[im 4/32  bone]
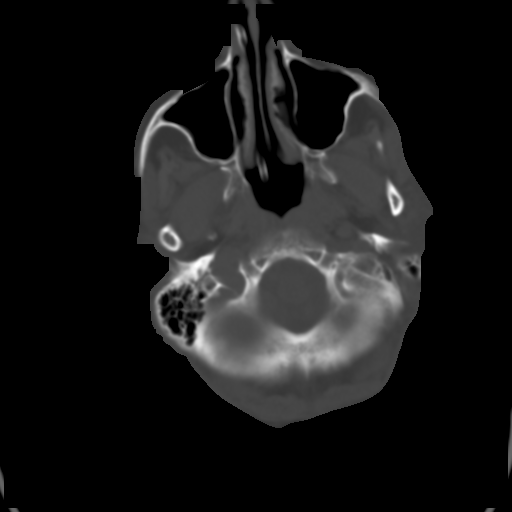
[im 8/32  brain]
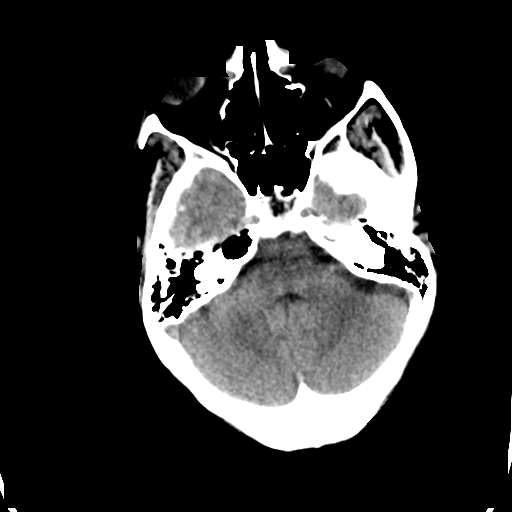
[im 12/32  brain]
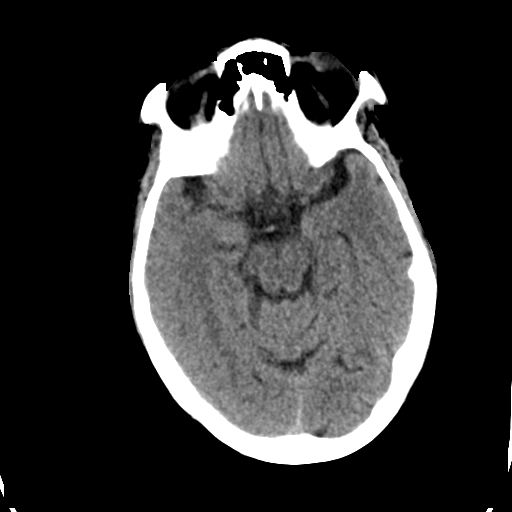
[im 16/32  brain]
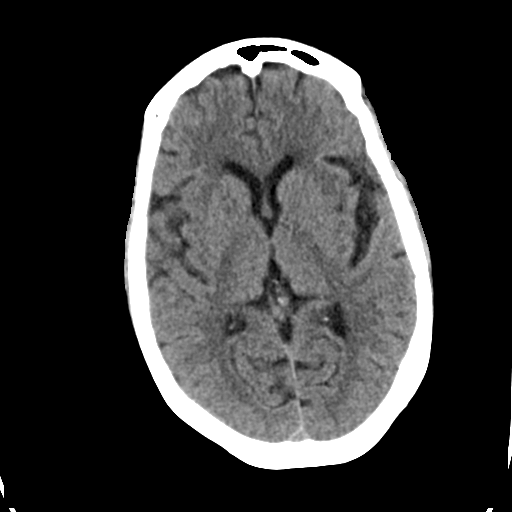
[im 20/32  brain]
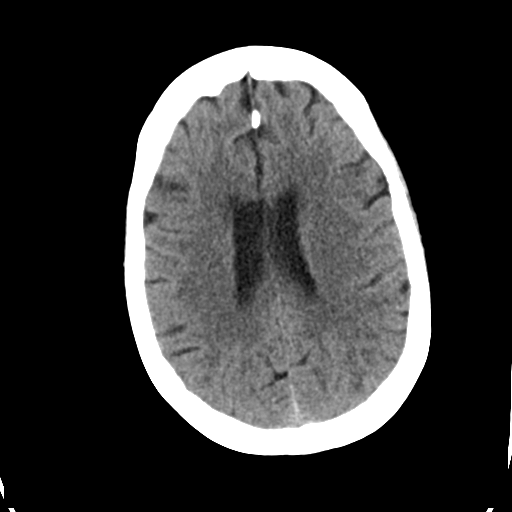
[im 20/32  bone]
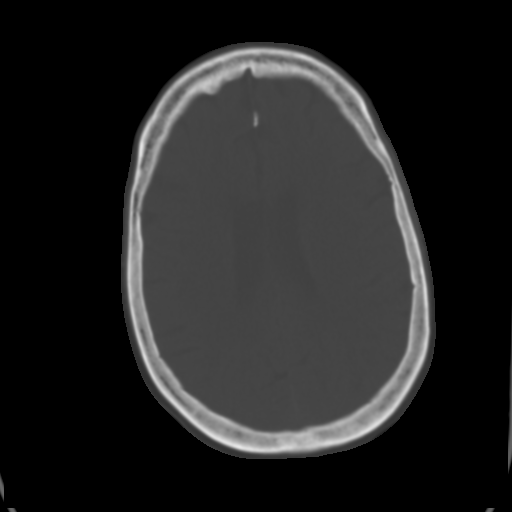
[im 24/32  brain]
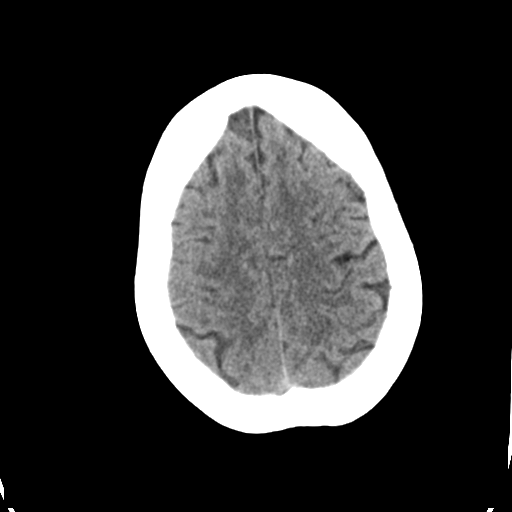
[im 28/32  brain]
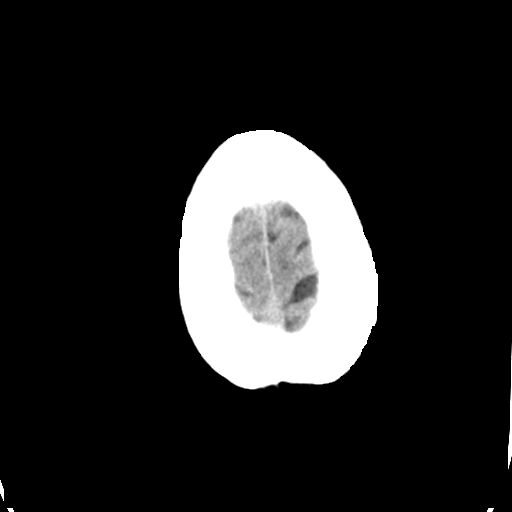

[Series 5: head without cor · coronal · non-contrast · 0.33mm/px · 3 of 65 slices shown]
[im 22/65  brain]
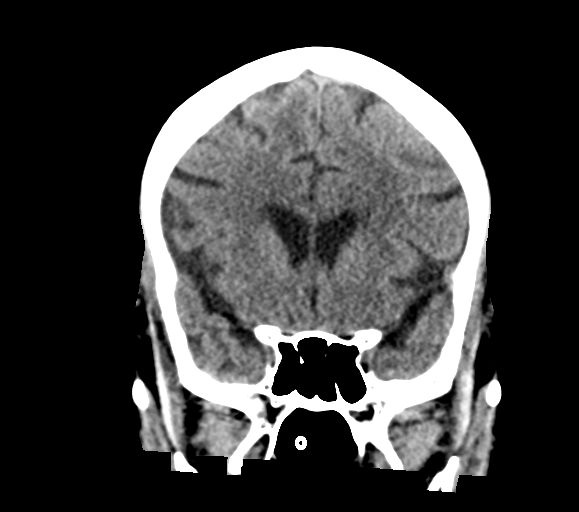
[im 29/65  brain]
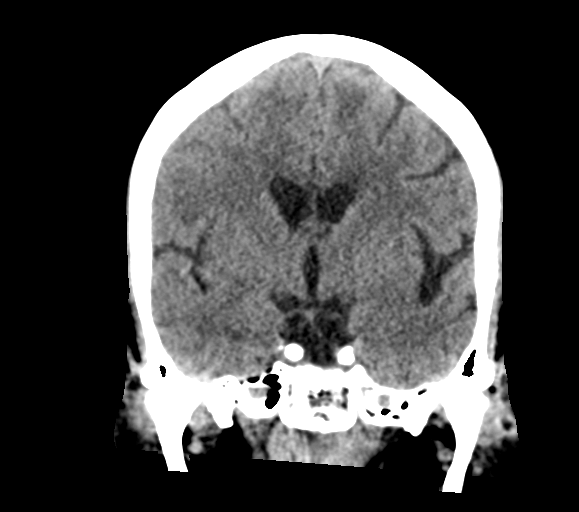
[im 36/65  brain]
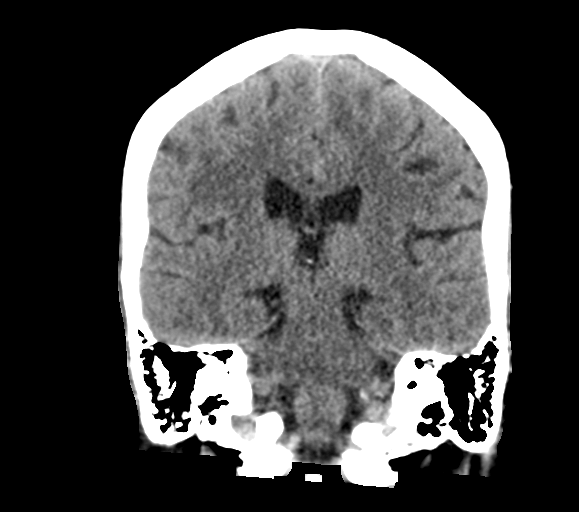

[Series 6: head without sag · sagittal · non-contrast · 0.34mm/px · 3 of 47 slices shown]
[im 16/47  brain]
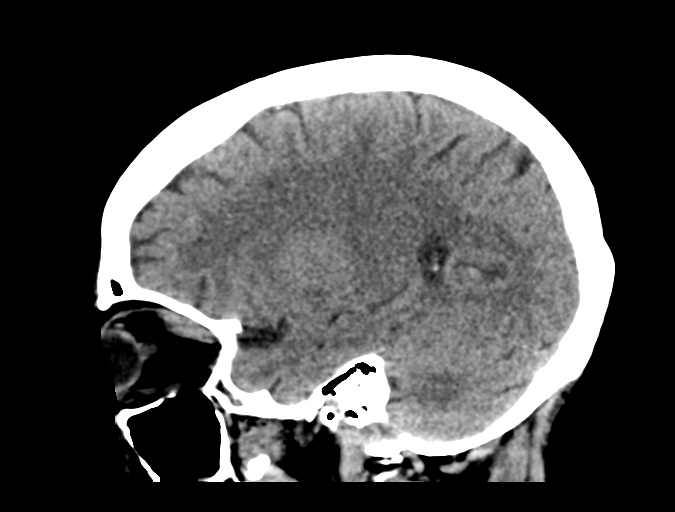
[im 24/47  brain]
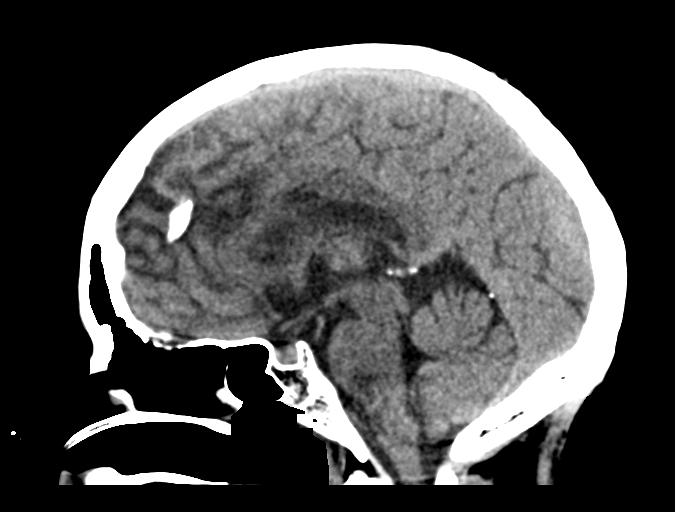
[im 31/47  brain]
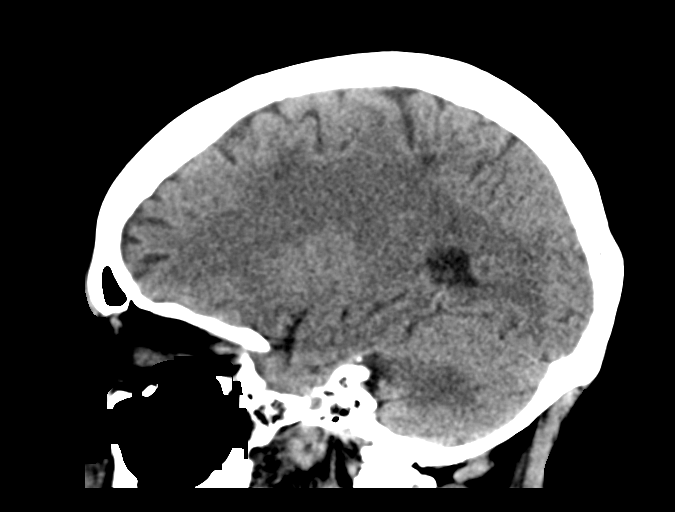

[16 of 47 positions shown; findings below may reference images not displayed]

FINDINGS: Brain:

Mild cerebral atrophy.

Mild-to-moderate patchy hypodensity within the cerebral white
matter. Redemonstrated chronic focus of gliosis within the body of
the corpus callosum. Findings are nonspecific but most commonly
related to chronic small vessel ischemia.

There is no acute intracranial hemorrhage.

No demarcated cortical infarct.

No extra-axial fluid collection.

No evidence of intracranial mass.

No midline shift.

Vascular: No hyperdense vessel.  Atherosclerotic calcifications.

Skull: Normal. Negative for fracture or focal lesion.

Sinuses/Orbits: Visualized orbits show no acute finding. Small
mucous retention cyst and mild mucosal thickening within the right
maxillary sinus.
IMPRESSION: No evidence of acute intracranial abnormality.

Stable mild-to-moderate cerebral white matter disease.
Redemonstrated chronic focus of gliosis within the callosal.
Findings are nonspecific but most commonly related to chronic small
vessel ischemia.

Mild cerebral atrophy.

Right maxillary sinusitis.

## 2022-04-20 IMAGING — DX DG CHEST 1V PORT
1 series · 1 of 1 positions shown · non-contrast
Comparison: 03/18/2020

CLINICAL DATA: Dyspnea

EXAM:
PORTABLE CHEST 1 VIEW

[chest ap]
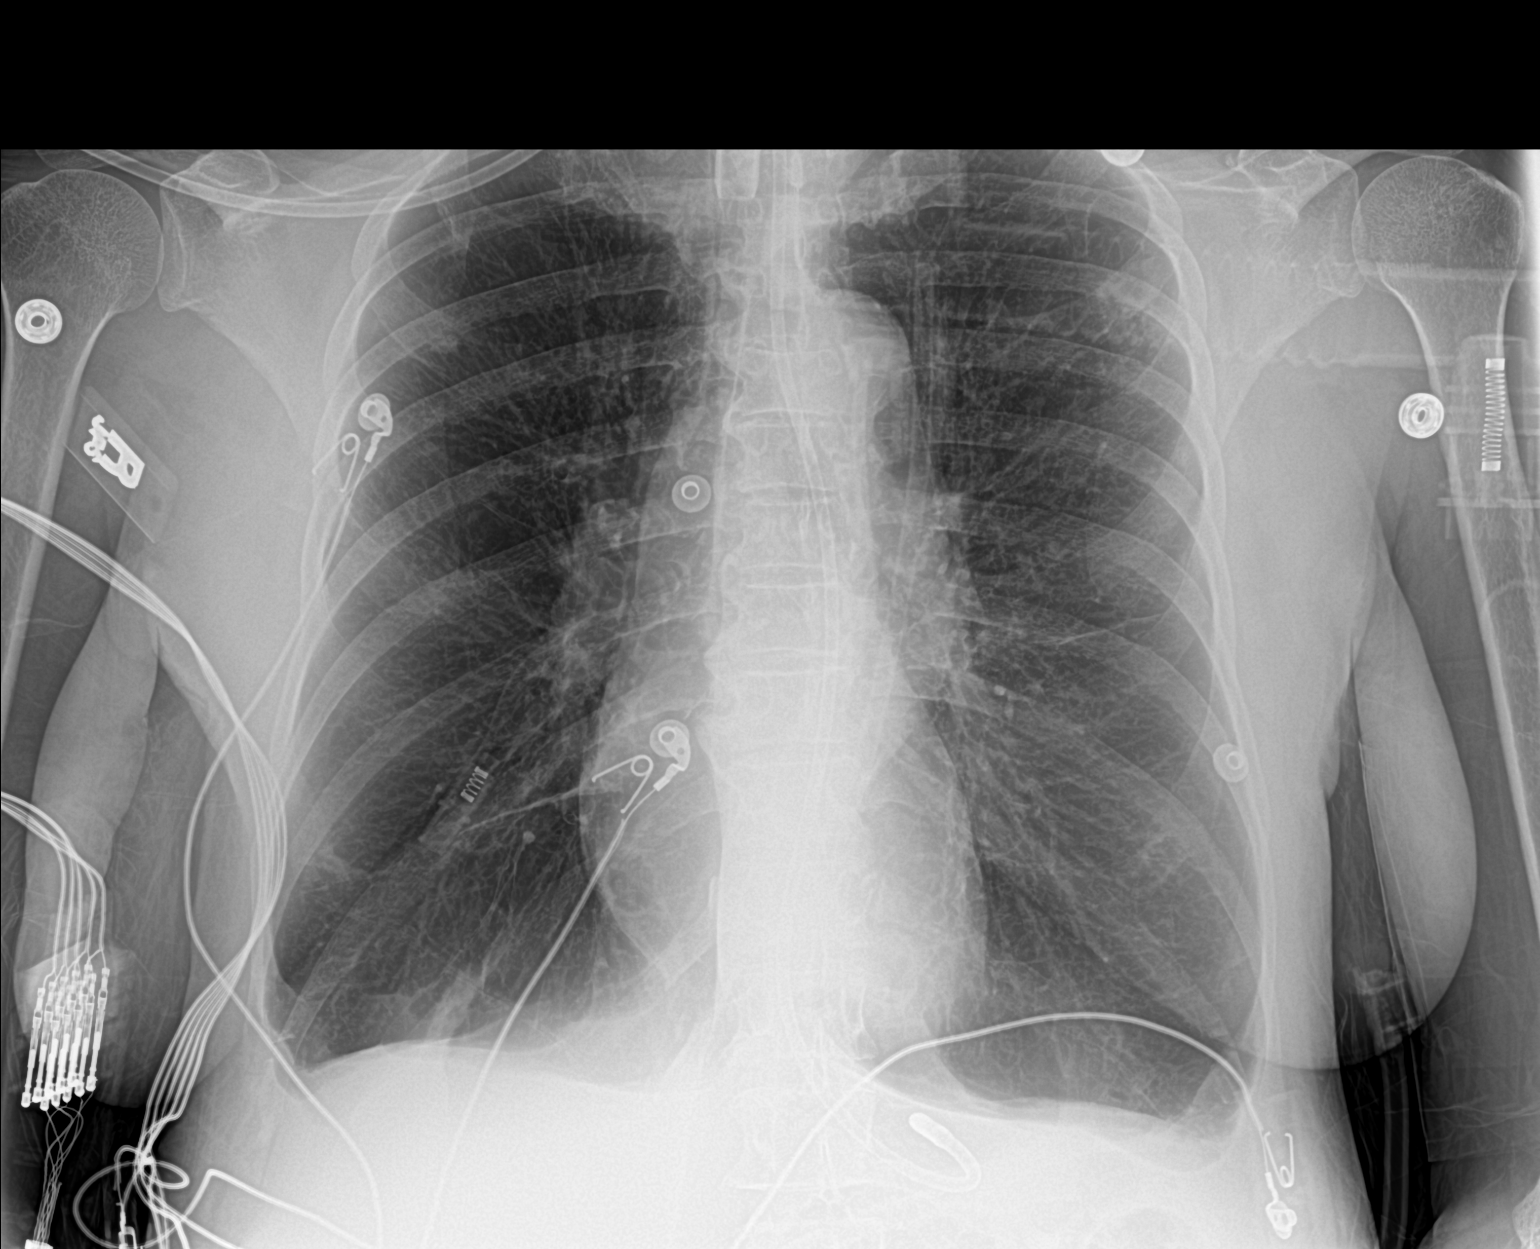

[1 of 1 positions shown; findings below may reference images not displayed]

FINDINGS: Tracheostomy and nasoenteric feeding tube with its tip just within
the stomach beyond the gastroesophageal junction are unchanged. The
lungs are symmetrically hyperinflated in keeping with changes of
underlying COPD. Nodules at the right apex are again identified,
better evaluated on prior chest CT of 02/28/2020. No pneumothorax or
pleural effusion. Cardiac size within normal limits. Pulmonary
vascularity is normal. No acute bone abnormality.
IMPRESSION: No active disease.  COPD.

## 2022-04-22 IMAGING — DX DG ABDOMEN 1V
1 series · 1 of 1 positions shown · non-contrast
Comparison: Single-view of the abdomen 03/08/2020.

CLINICAL DATA: Abdominal pain.

EXAM:
ABDOMEN - 1 VIEW

[abdomen]
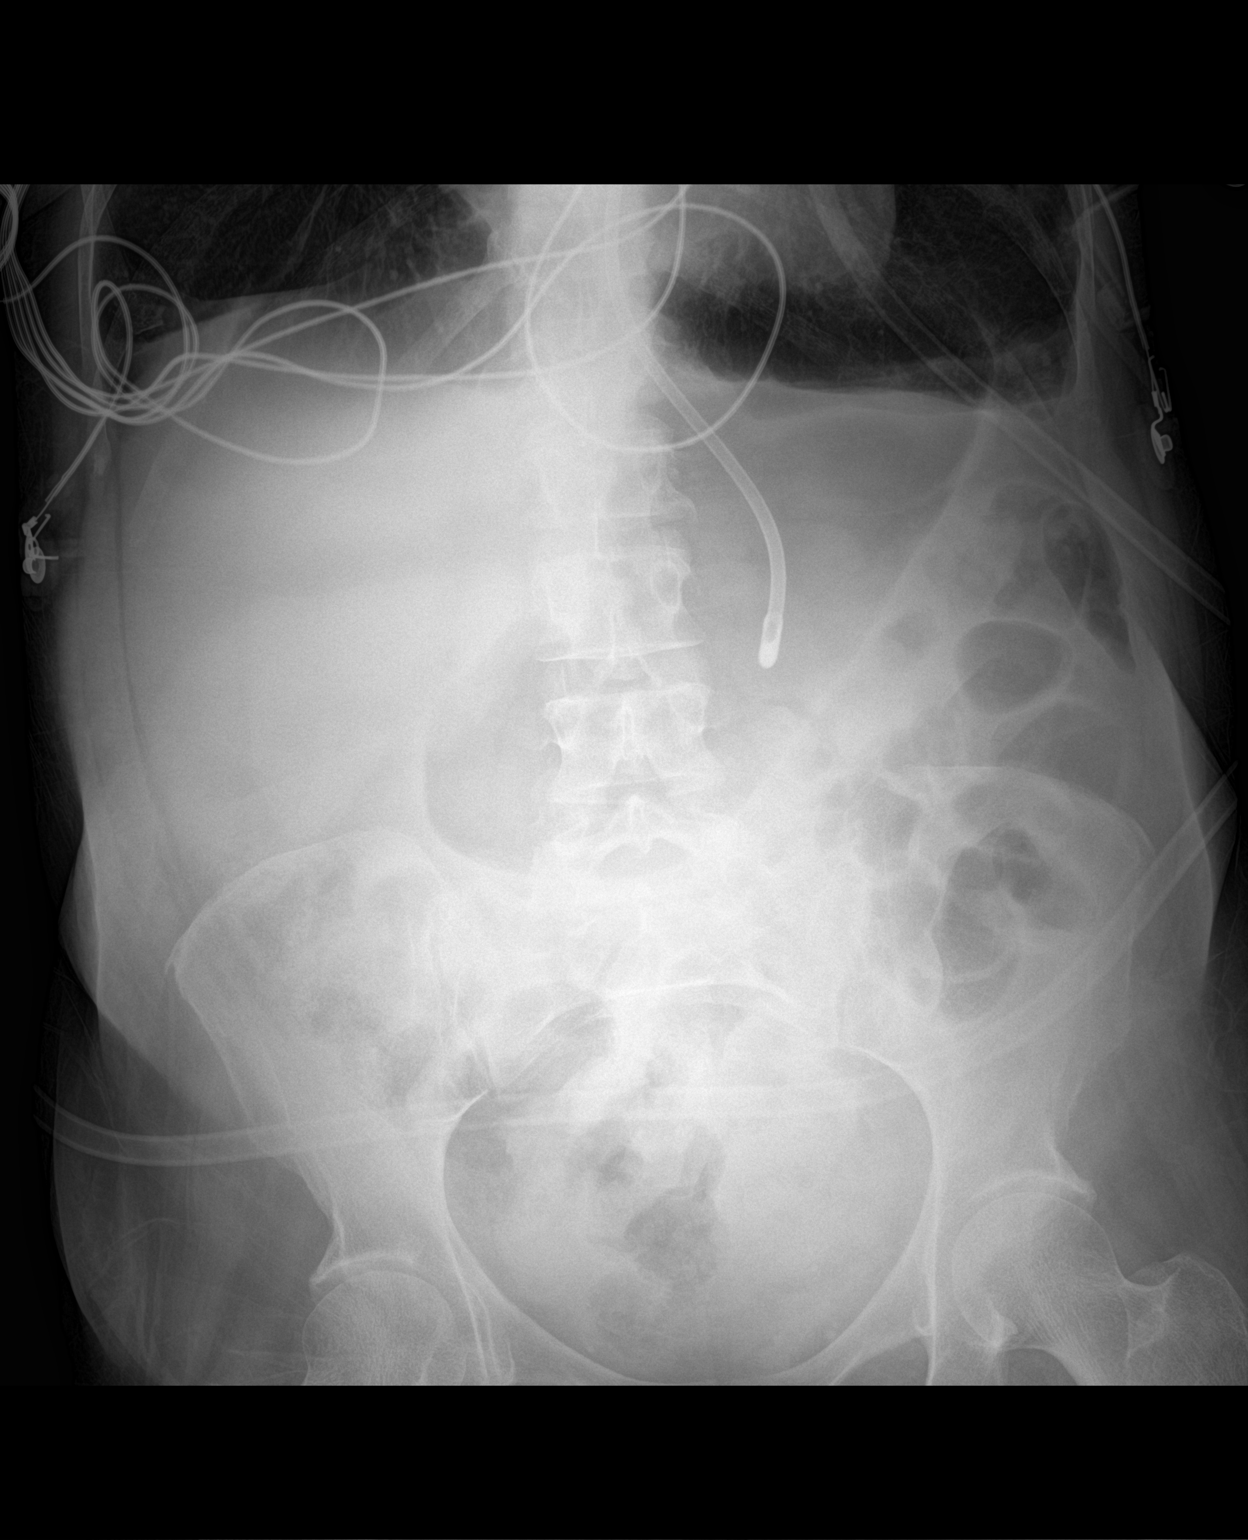

[1 of 1 positions shown; findings below may reference images not displayed]

FINDINGS: Feeding tube is in place with the tip in the stomach. Bowel-gas
pattern is benign. No unexpected abdominal calcification or focal
bony abnormality.
IMPRESSION: No acute finding.

Feeding tube tip is in the stomach, unchanged.

## 2022-04-23 IMAGING — DX DG ABD PORTABLE 1V
1 series · 1 of 1 positions shown · non-contrast
Comparison: CT 02/28/2020

CLINICAL DATA: 54-year-old female referred for possible gastrostomy

EXAM:
PORTABLE ABDOMEN - 1 VIEW

[abdomen kub]
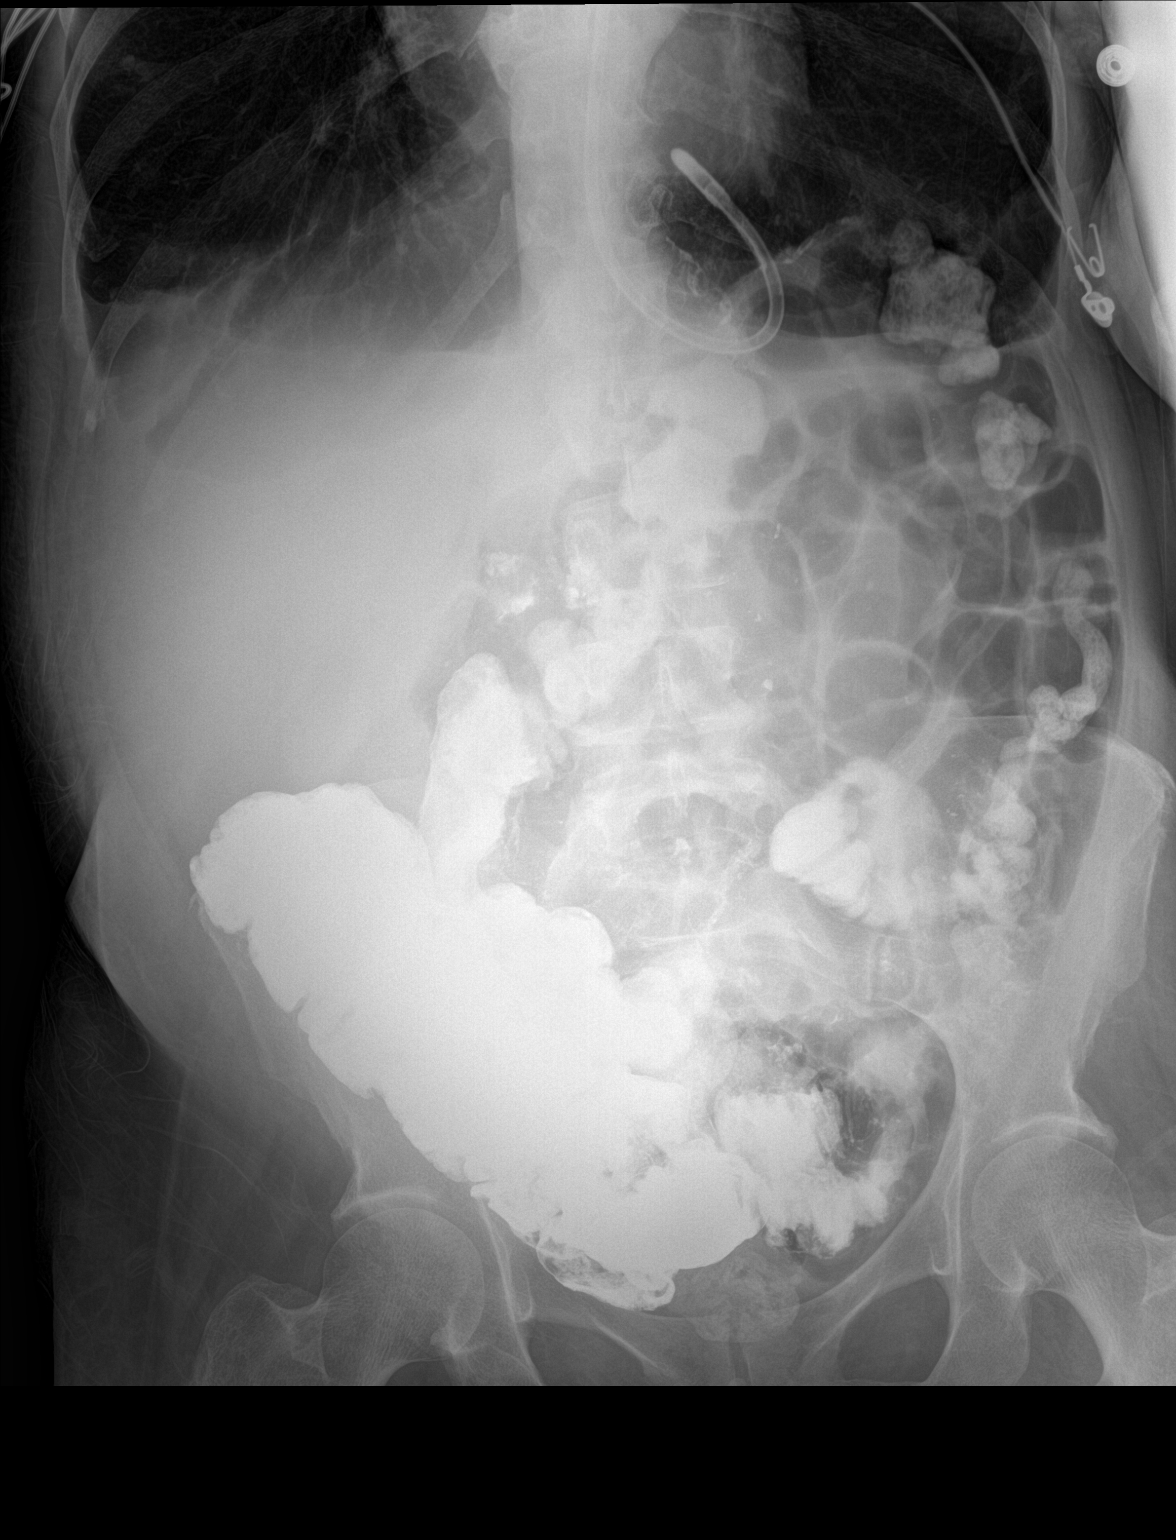

[1 of 1 positions shown; findings below may reference images not displayed]

FINDINGS: Weighted tip enteric feeding tube terminates within the distal
esophagus, with redundancy above the diaphragm.

Enteric contrast present within small bowel and colon. The course of
the transverse colon overlies the enteric tube in the esophagus,
with the splenic flexure above the left hemidiaphragm.

Gas within small bowel.  No abnormal distension.
IMPRESSION: Nonobstructive bowel gas pattern.

Weighted tip enteric feeding tube above the diaphragm, redundant
within the distal esophagus.

Transverse colon projects over the distal feeding tube, as well as
above the left hemidiaphragm, interposed between the anterior
abdominal wall and stomach.

## 2022-04-23 IMAGING — DX DG CHEST 1V PORT
1 series · 1 of 1 positions shown · non-contrast
Comparison: 03/20/2020.  03/13/2020.  CT 02/28/2020.

CLINICAL DATA: Respiratory failure.

EXAM:
PORTABLE CHEST 1 VIEW

[chest ap]
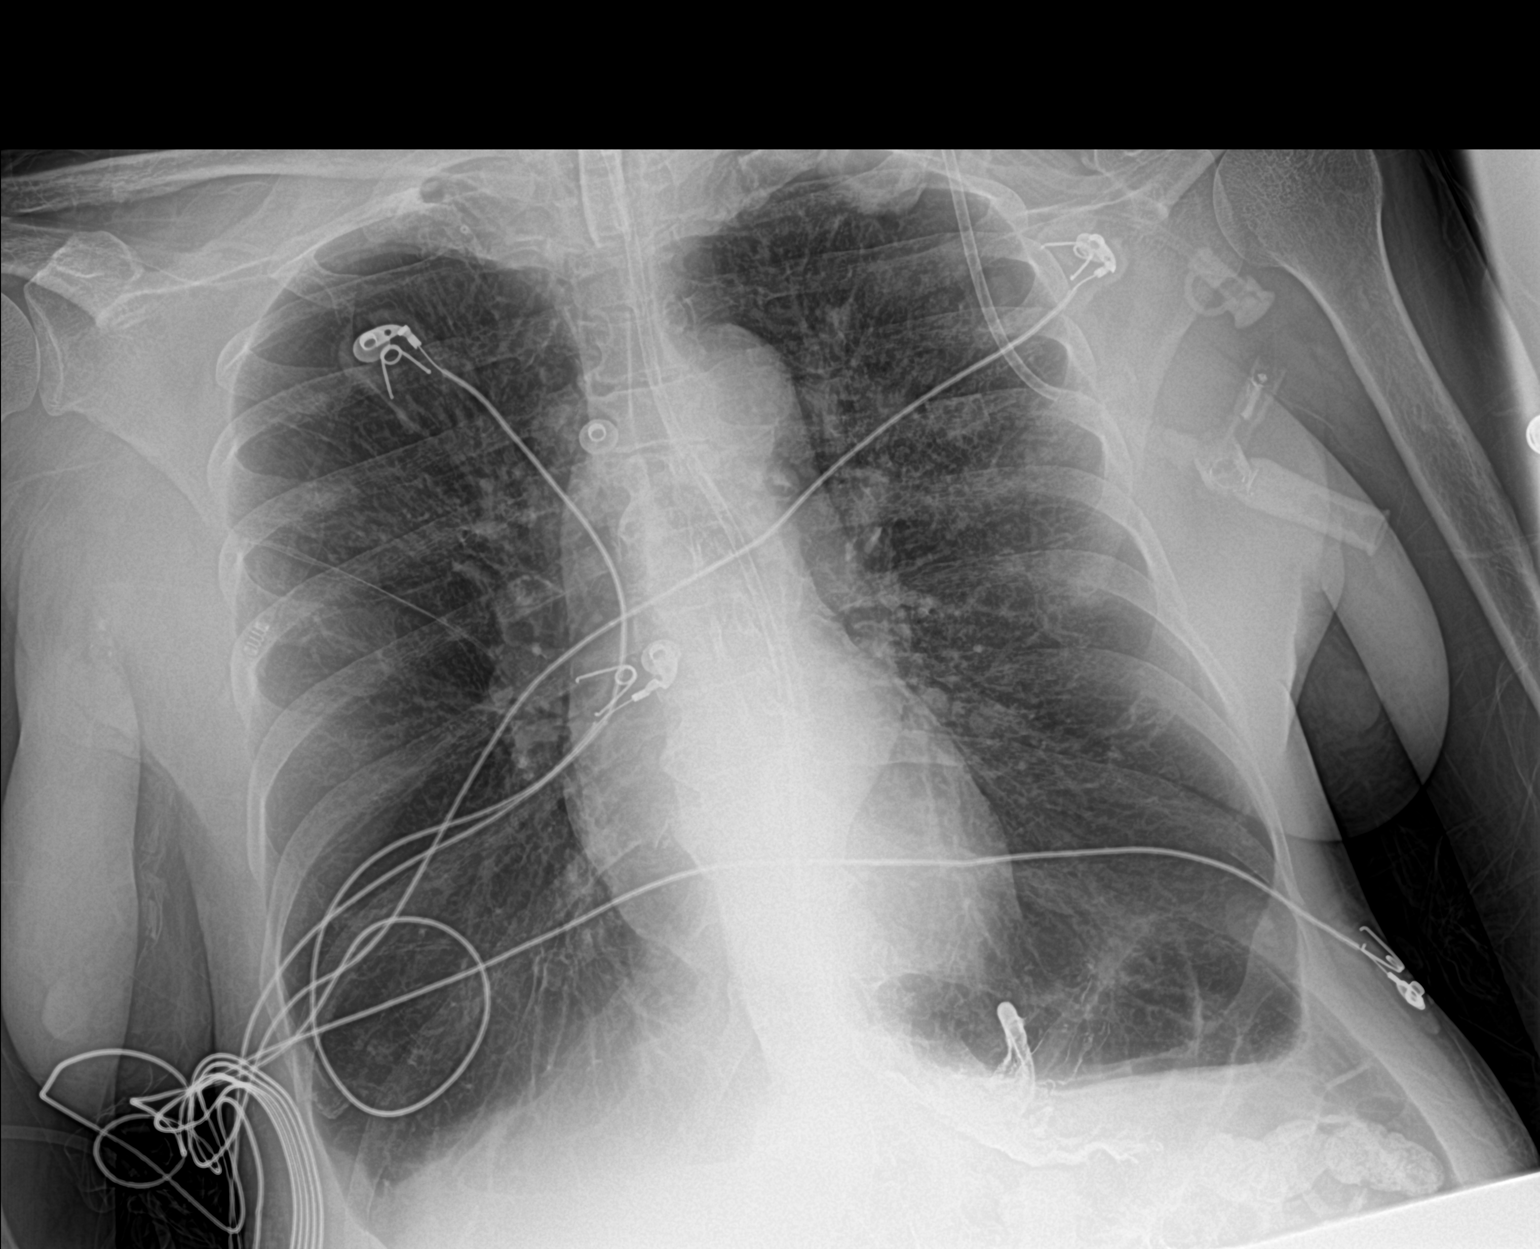

[1 of 1 positions shown; findings below may reference images not displayed]

FINDINGS: Apical lordotic view obtained. Tracheostomy tube and feeding tube in
stable position. Oral contrast noted in the stomach. Mediastinum and
hilar structures normal. Heart size normal. COPD. No focal alveolar
infiltrate. Nodular opacities in the right upper lung again
identified, better identified by prior CT. Prominent nipple shadow
left base. Small bilateral pleural effusions. No pneumothorax. No
acute bony abnormality identified.
IMPRESSION: 1. Tracheostomy tube and feeding tube in stable position. Oral
contrast noted in the stomach.
2. COPD. No focal alveolar infiltrate. Small bilateral pleural
effusions.
3. Small nodular densities again noted over the right upper lung a
best identified by prior CT.

## 2022-04-23 IMAGING — XA IR FLUORO RM 0-60 MIN
4 series · 4 of 4 positions shown · non-contrast
Comparison: none

INDICATION: 54-year-old female referred for possible gastrostomy tube

[Series 1: single · 1 of 1 slices shown (1 of 3)]
[im 1/1]
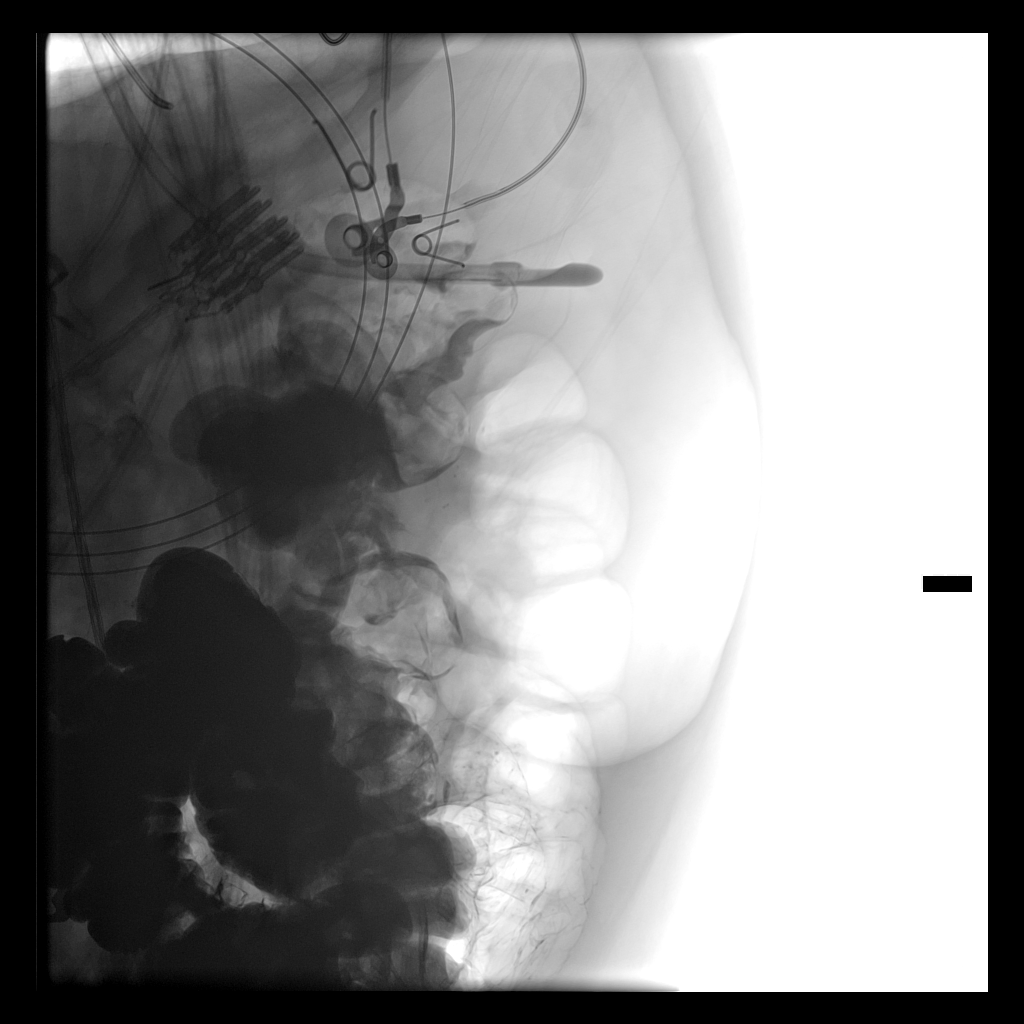

[Series 2: single · 1 of 1 slices shown (2 of 3)]
[im 1/1]
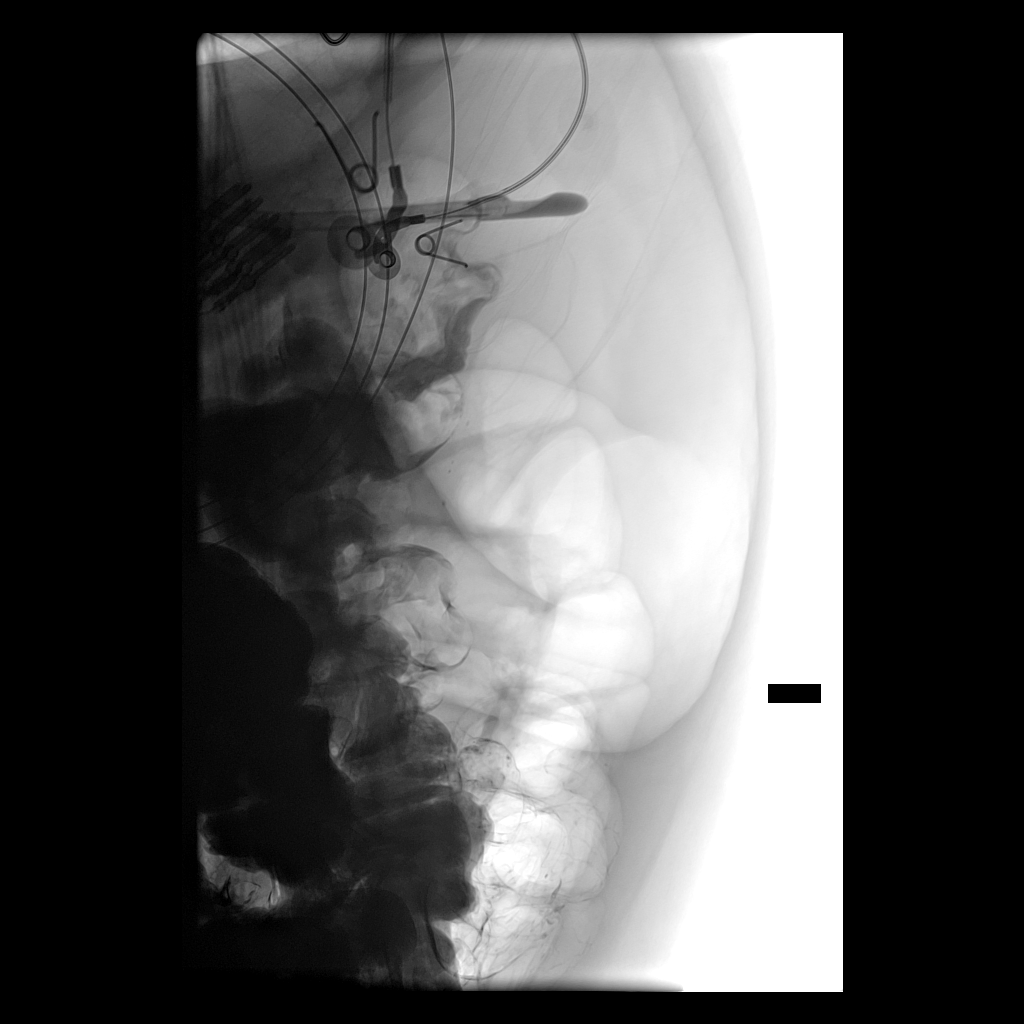

[Series 3: fl (-) angio · 1 of 1 slices shown]
[im 1/1]
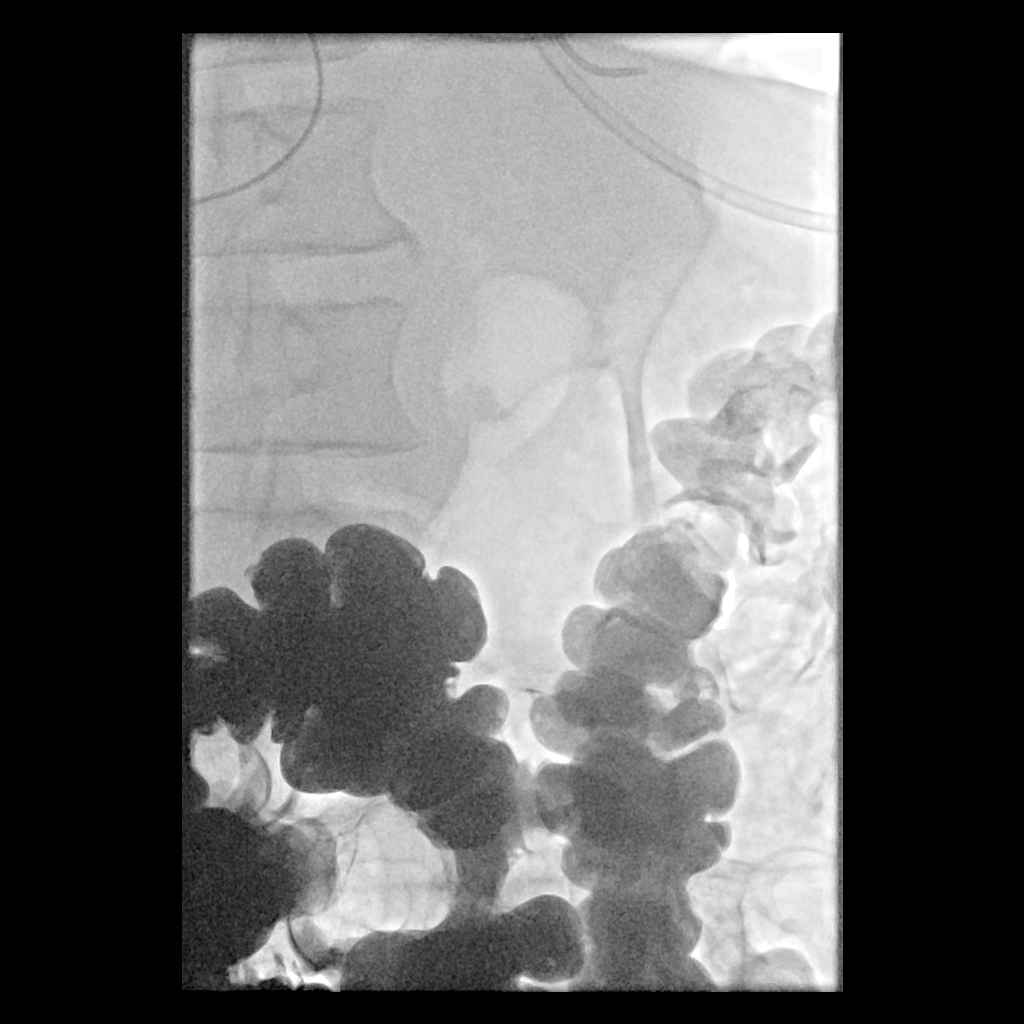

[Series 4: single · 1 of 1 slices shown (3 of 3)]
[im 1/1]
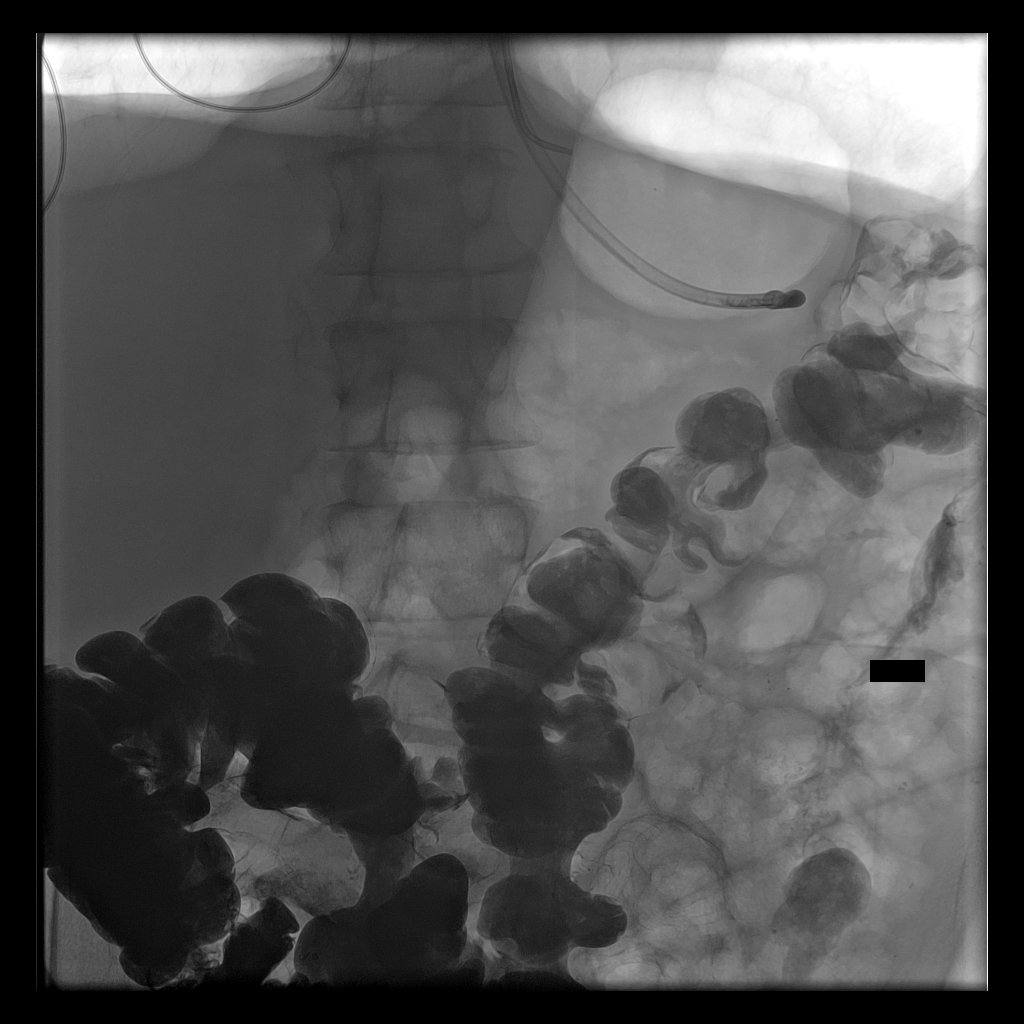

[4 of 4 positions shown; findings below may reference images not displayed]

EXAM:
IR FLUORO RM 0-60 MIN

MEDICATIONS:
None

ANESTHESIA/SEDATION:
None

The patient was continuously monitored during the procedure by the
interventional radiology nurse under my direct supervision.

CONTRAST:  None

FLUOROSCOPY TIME:  Fluoroscopy Time: 1 minutes 24 seconds (15.4
mGy).

COMPLICATIONS:
None

PROCEDURE:
Informed written consent was obtained from the patient's family
after a thorough discussion of the procedural risks, benefits and
alternatives. All questions were addressed. Maximal Sterile Barrier
Technique was utilized including caps, mask, sterile gowns, sterile
gloves, sterile drape, hand hygiene and skin antiseptic. A timeout
was performed prior to the initiation of the procedure.

Patient positioned supine position on the image intensifier table.

Orogastric tube was advanced to the stomach.

Stomach was insufflated. Multiple projection plain film/fluoroscopic
imaging was performed.

We withdrew with observation of the transverse colon overlying the
target point of the anterior stomach.

Gastric tube was removed. Stomach was vented through the indwelling
feeding tube.

Patient tolerated the procedure well and remained hemodynamically
stable throughout.

No complications were encountered and no significant blood loss
encountered.
IMPRESSION: Attempt of percutaneous gastrostomy was abandoned after stomach
insufflation, with initial imaging revealing that the colon overlies
the target point of the stomach.

## 2022-04-26 IMAGING — DX DG ABD PORTABLE 1V
1 series · 1 of 1 positions shown · non-contrast
Comparison: Exam from March 23, 2020

CLINICAL DATA: Abdominal pain

EXAM:
PORTABLE ABDOMEN - 1 VIEW

[abdomen kub]
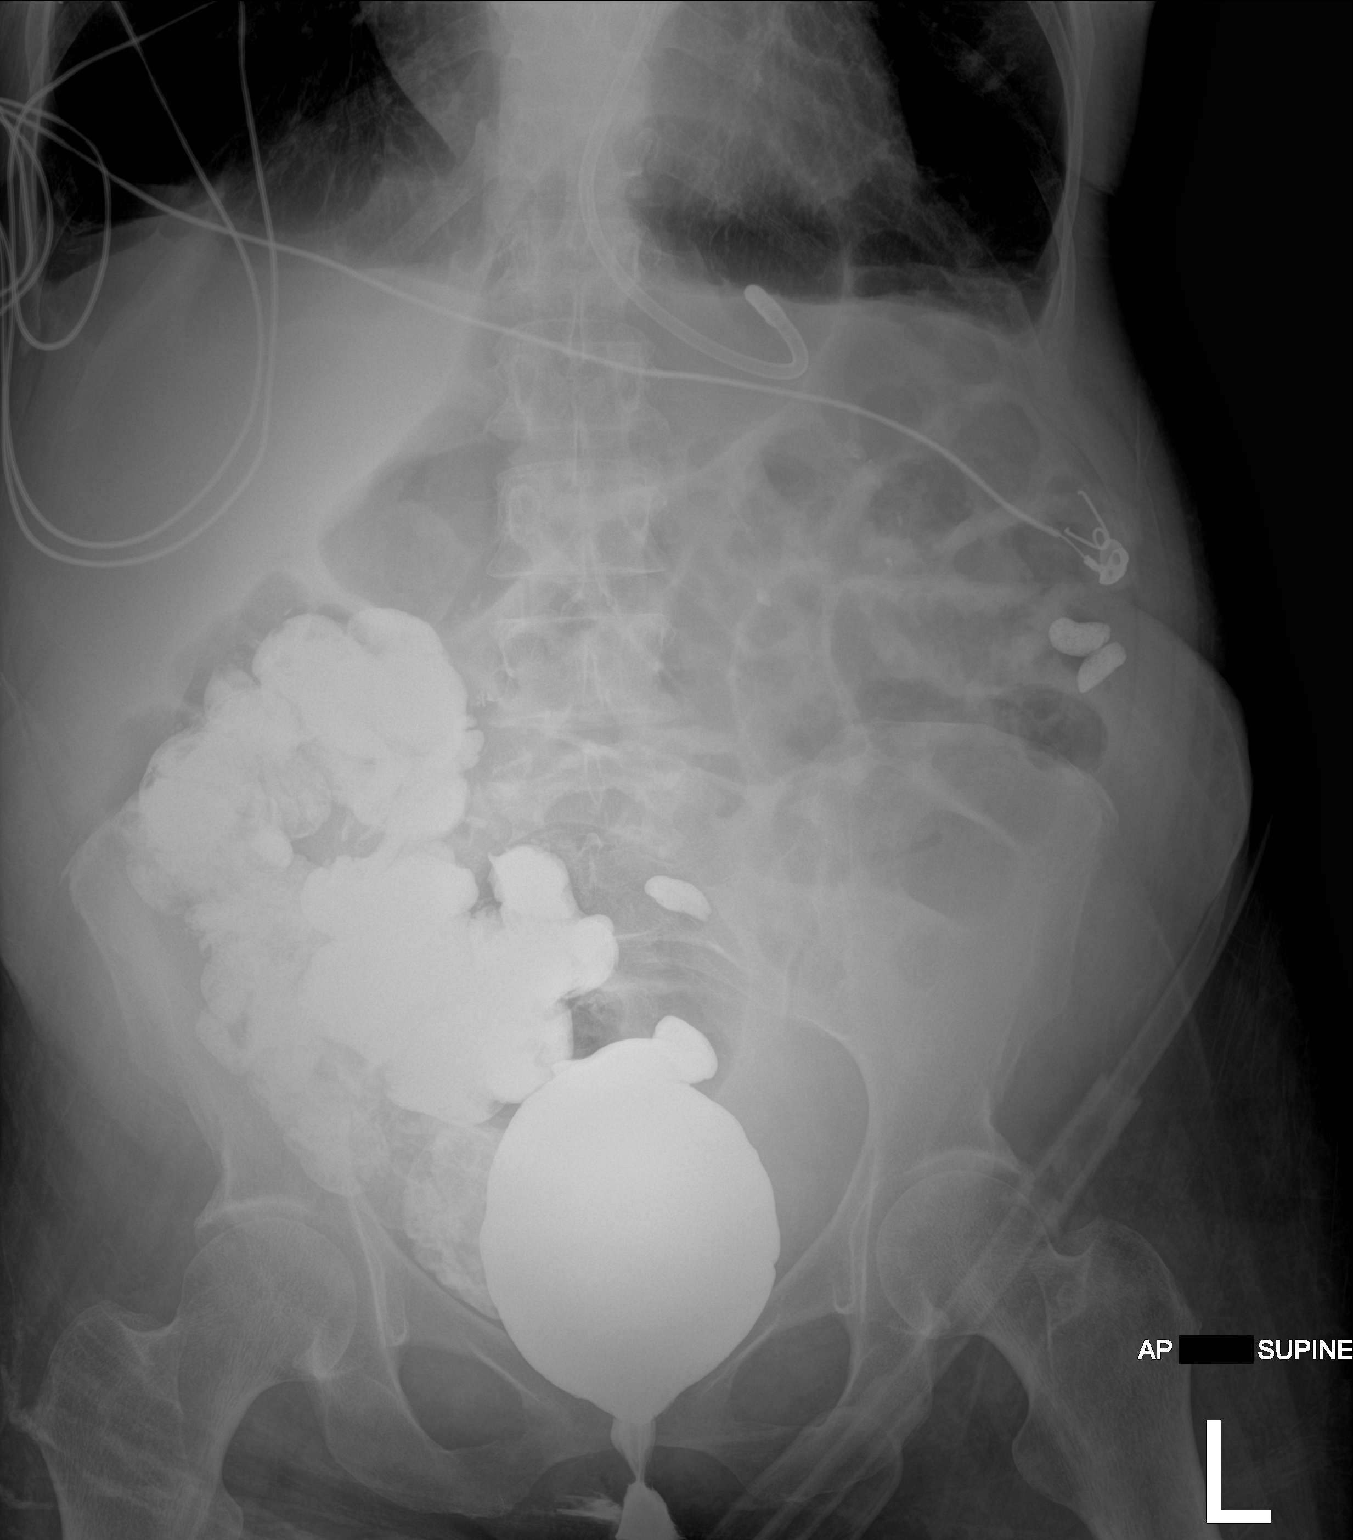

[1 of 1 positions shown; findings below may reference images not displayed]

FINDINGS: Weighted tip feeding tube seen projecting over the central upper
abdomen and lower chest. Contour of gas-filled stomach is present.
Weighted tip feeding tube may be within the proximal stomach.

Gas-filled loops of bowel present throughout the abdomen.

Dense contrast material throughout distal bowel including the
rectum. Rectum is more distended than on the previous study.
IMPRESSION: 1. Feeding tube in the proximal stomach.
2. Progression of contrast material into the distal bowel, now in
the rectum.
3. Dilated loops of small bowel may reflect ileus but are grossly
unchanged compared to the recent comparison.
4. Rectum is more distended than on the previous study. Question
fecal impaction.

## 2022-05-01 IMAGING — DX DG ABDOMEN 1V
1 series · 1 of 1 positions shown · non-contrast
Comparison: Radiograph 03/26/2020

CLINICAL DATA: Ileus/small bowel pattern.

EXAM:
ABDOMEN - 1 VIEW

[abdomen]
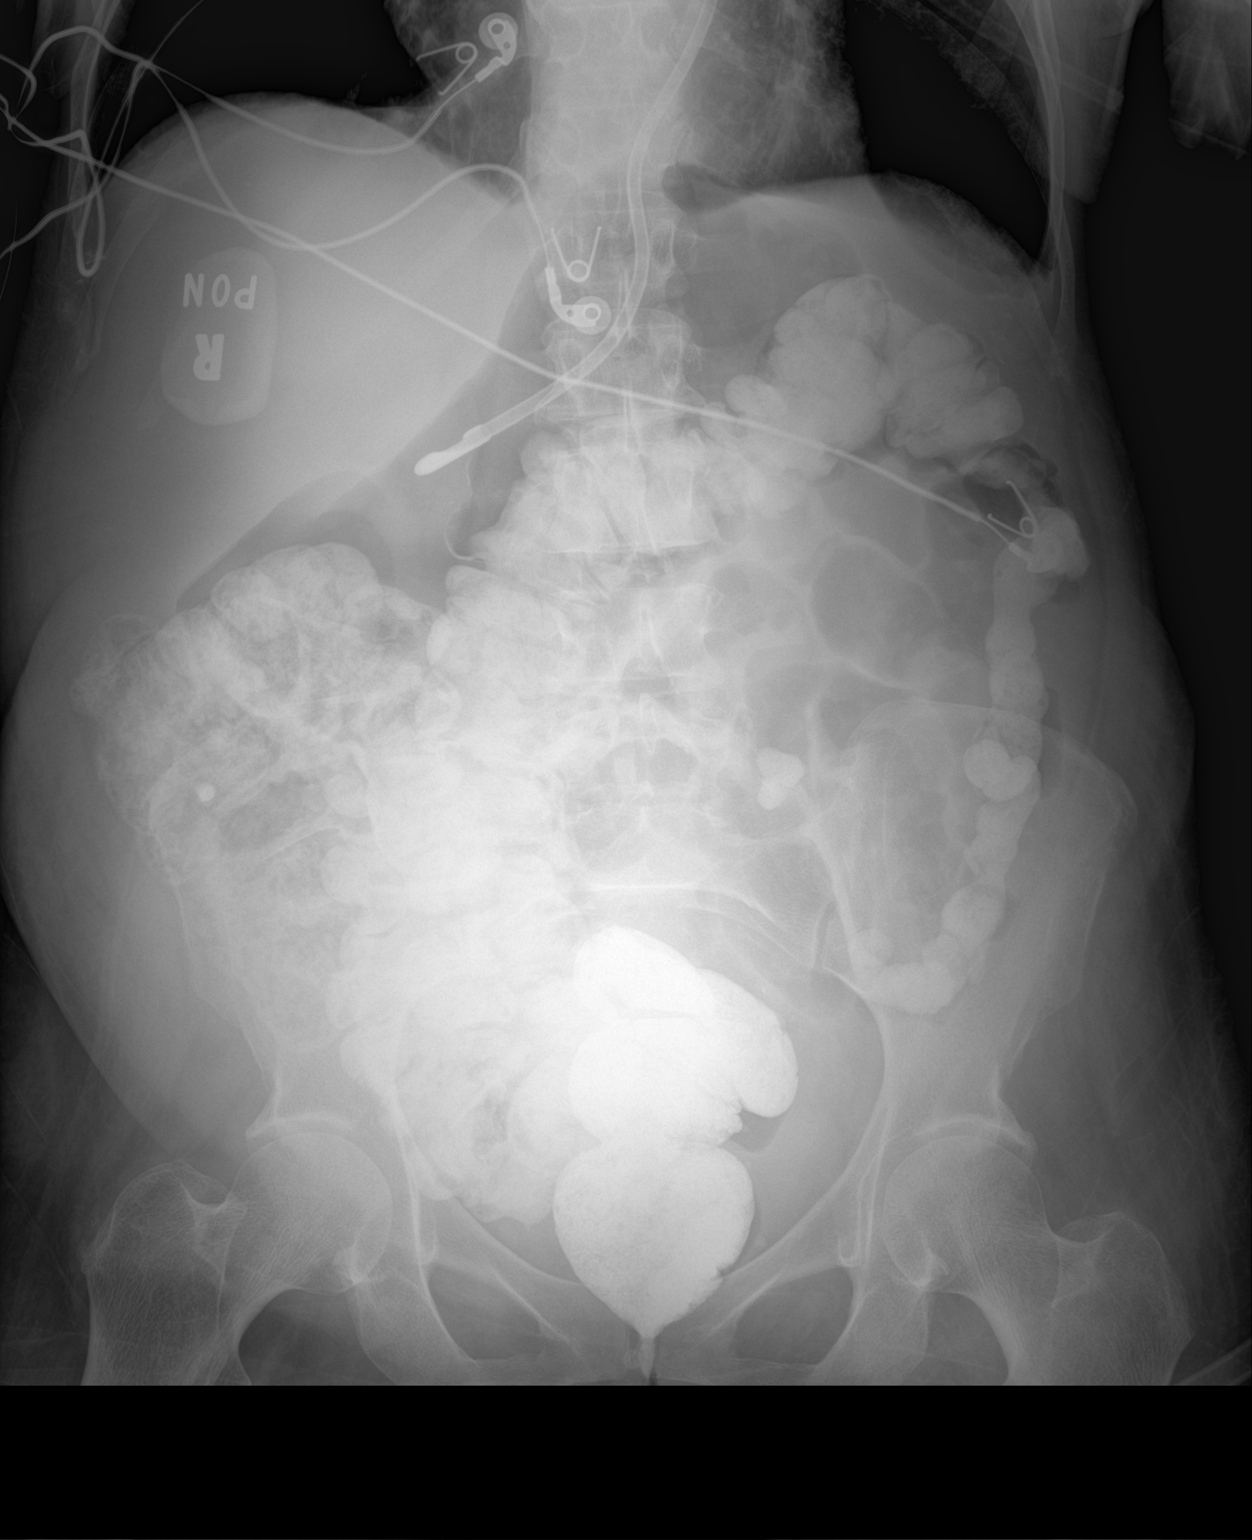

[1 of 1 positions shown; findings below may reference images not displayed]

FINDINGS: Feeding tube tip and side-port below the diaphragm in the stomach.
High-density material typical of contrast is seen throughout the
entire colon including the rectum. Decreased rectal distension from
prior exam. Air-filled loops of prominent small bowel in the left
abdomen, diminished from prior. There is mild gaseous gastric
distension.
IMPRESSION: 1. Persistent but diminishing air-filled small bowel, likely
resolving ileus. No evidence of obstruction.
2. High-density material typical of contrast is seen throughout the
entire colon. Decreased rectal distension from prior exam.

## 2022-05-01 IMAGING — DX DG CHEST 1V PORT
1 series · 1 of 1 positions shown · non-contrast
Comparison: 03/23/2020

CLINICAL DATA: Pneumonia.  Evaluate tracheostomy tube.

EXAM:
PORTABLE CHEST 1 VIEW

[chest]
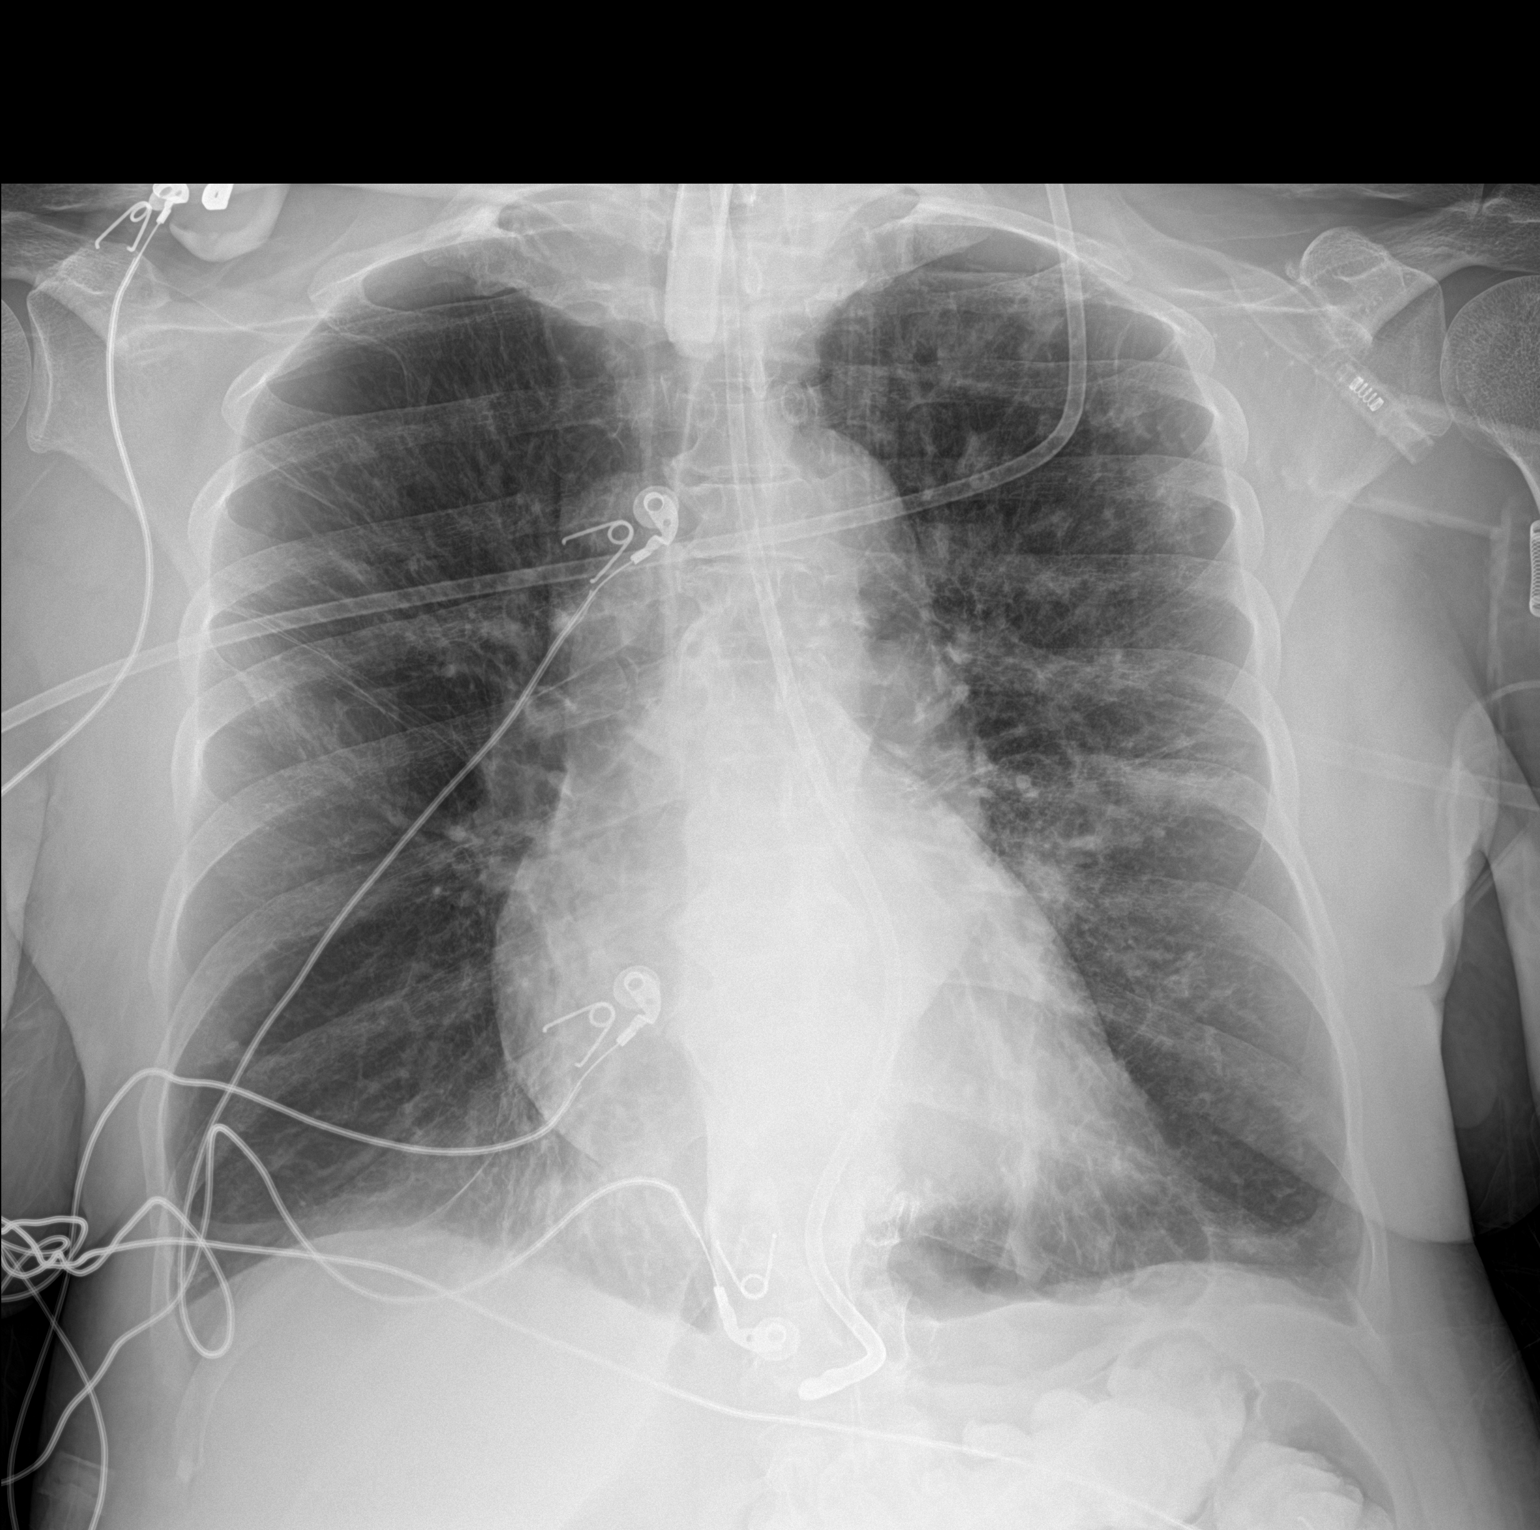

[1 of 1 positions shown; findings below may reference images not displayed]

FINDINGS: Tracheostomy tube projects over the mid trachea. Feeding tube tip is
in the proximal stomach. There is hyperinflation of the lungs
compatible with COPD. Heart is mildly enlarged. Areas of scarring in
the lungs bilaterally. Patchy left mid lung opacity has increased
since prior study. No effusions. No acute bony abnormality.
IMPRESSION: COPD/chronic changes.

Patchy opacity in the left mid lung concerning for pneumonia.

Tracheostomy tube in expected position. Feeding tube tip in the
proximal stomach.

## 2022-05-10 IMAGING — DX DG ABD PORTABLE 1V
1 series · 1 of 1 positions shown · non-contrast
Comparison: Abdominal x-ray dated March 31, 2020.

CLINICAL DATA: Enteric tube placement.

EXAM:
PORTABLE ABDOMEN - 1 VIEW

[abdomen]
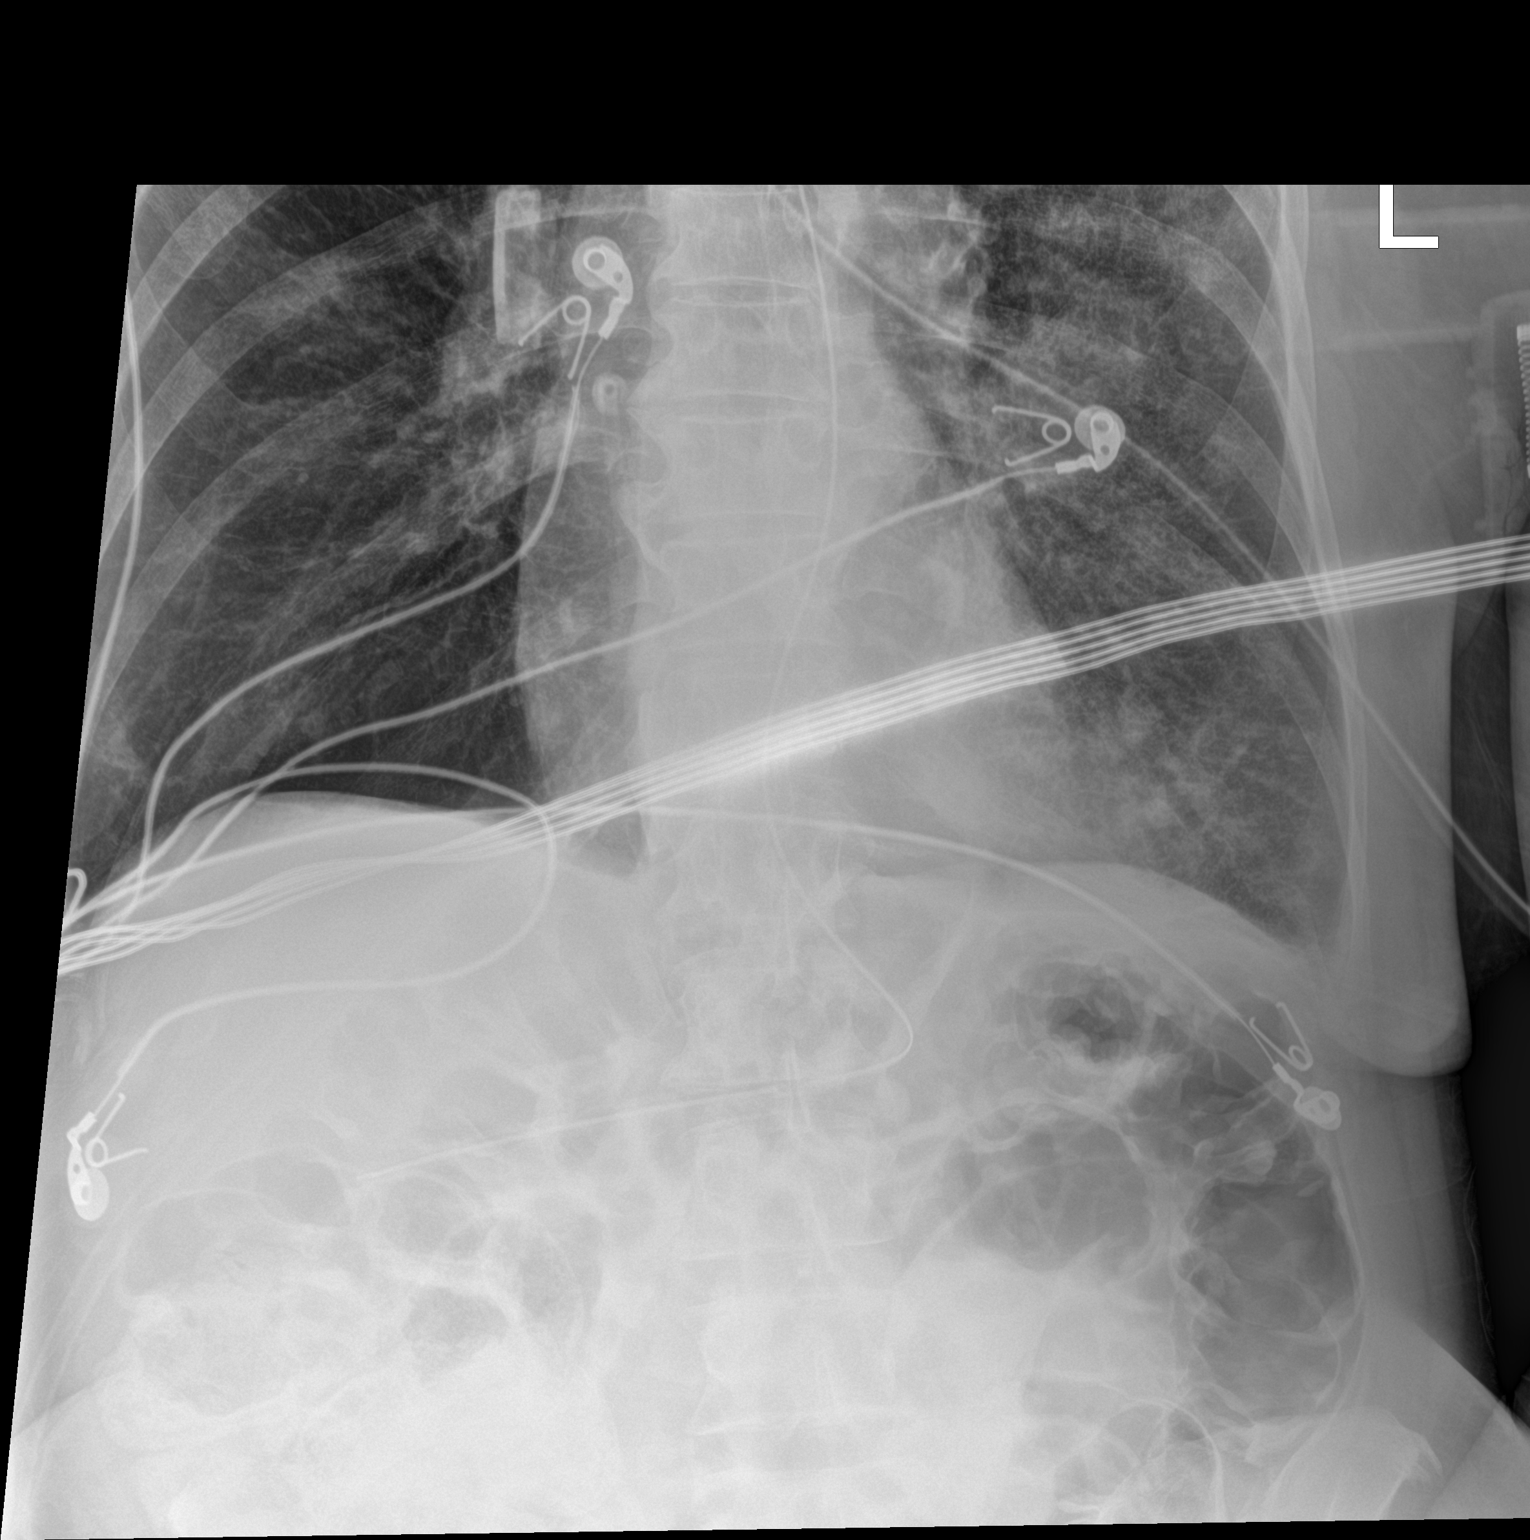

[1 of 1 positions shown; findings below may reference images not displayed]

FINDINGS: Enteric tube in the distal stomach. Persistent mildly distended
small bowel loops in the left upper quadrant. Continued patchy
opacities throughout the left lung.
IMPRESSION: 1. Enteric tube in the distal stomach.

## 2022-05-10 IMAGING — DX DG ABDOMEN 1V
1 series · 1 of 1 positions shown · non-contrast
Comparison: 04/09/2020

CLINICAL DATA: Enteric catheter placement

EXAM:
ABDOMEN - 1 VIEW

[abdomen]
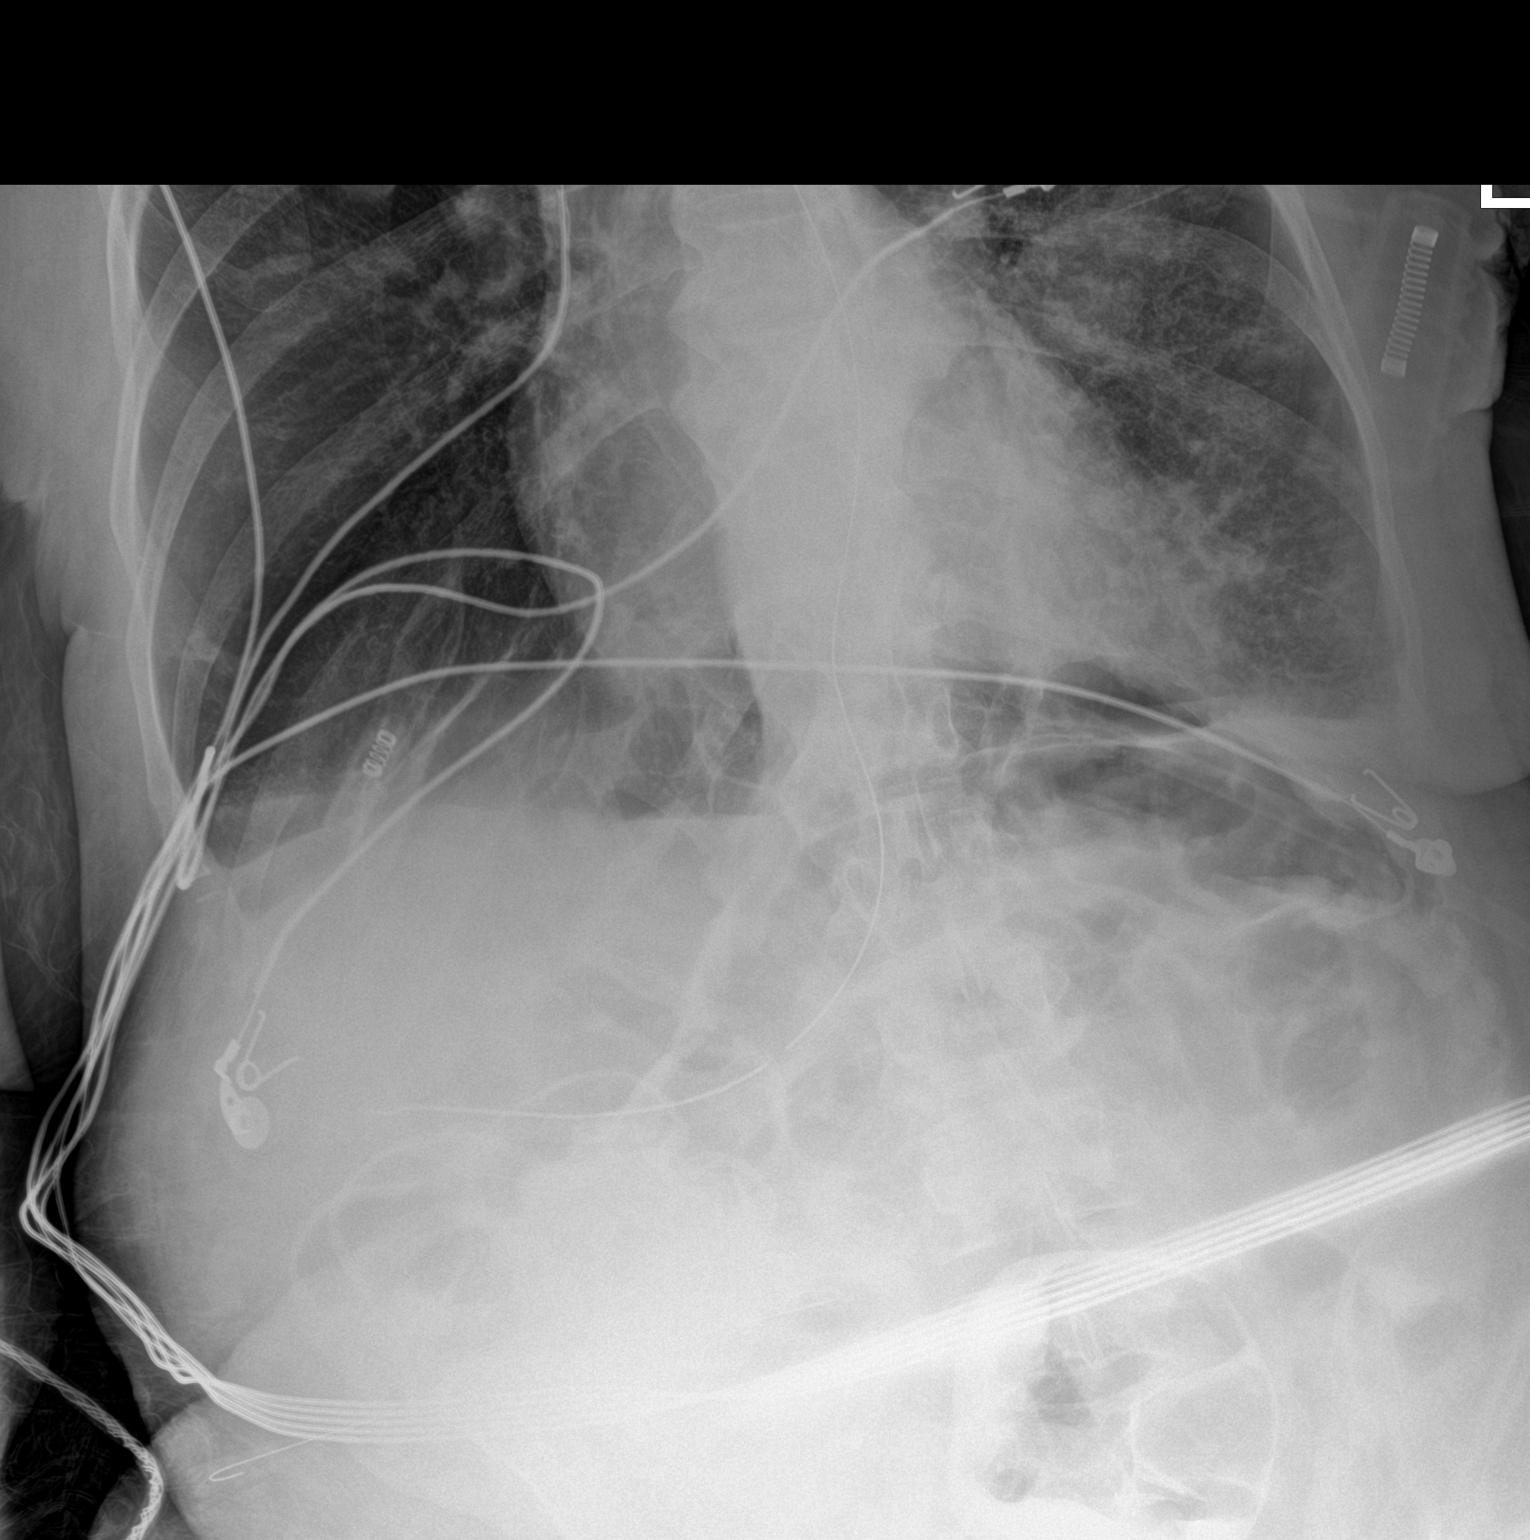

[1 of 1 positions shown; findings below may reference images not displayed]

FINDINGS: Frontal view of the lower chest and upper abdomen demonstrates
enteric catheter passes below diaphragm, tip and side port
projecting over gastric antrum. No significant change since prior
study. Distended gas-filled loops of large and small bowel are
unchanged. Patchy areas of consolidation at the lung bases are
stable.
IMPRESSION: 1. Stable enteric catheter overlying gastric antrum.

## 2022-05-18 IMAGING — DX DG CHEST 1V PORT
1 series · 1 of 1 positions shown · non-contrast
Comparison: Portable chest 03/31/2020 and earlier.

CLINICAL DATA: 54-year-old female with COPD.

EXAM:
PORTABLE CHEST 1 VIEW

[chest]
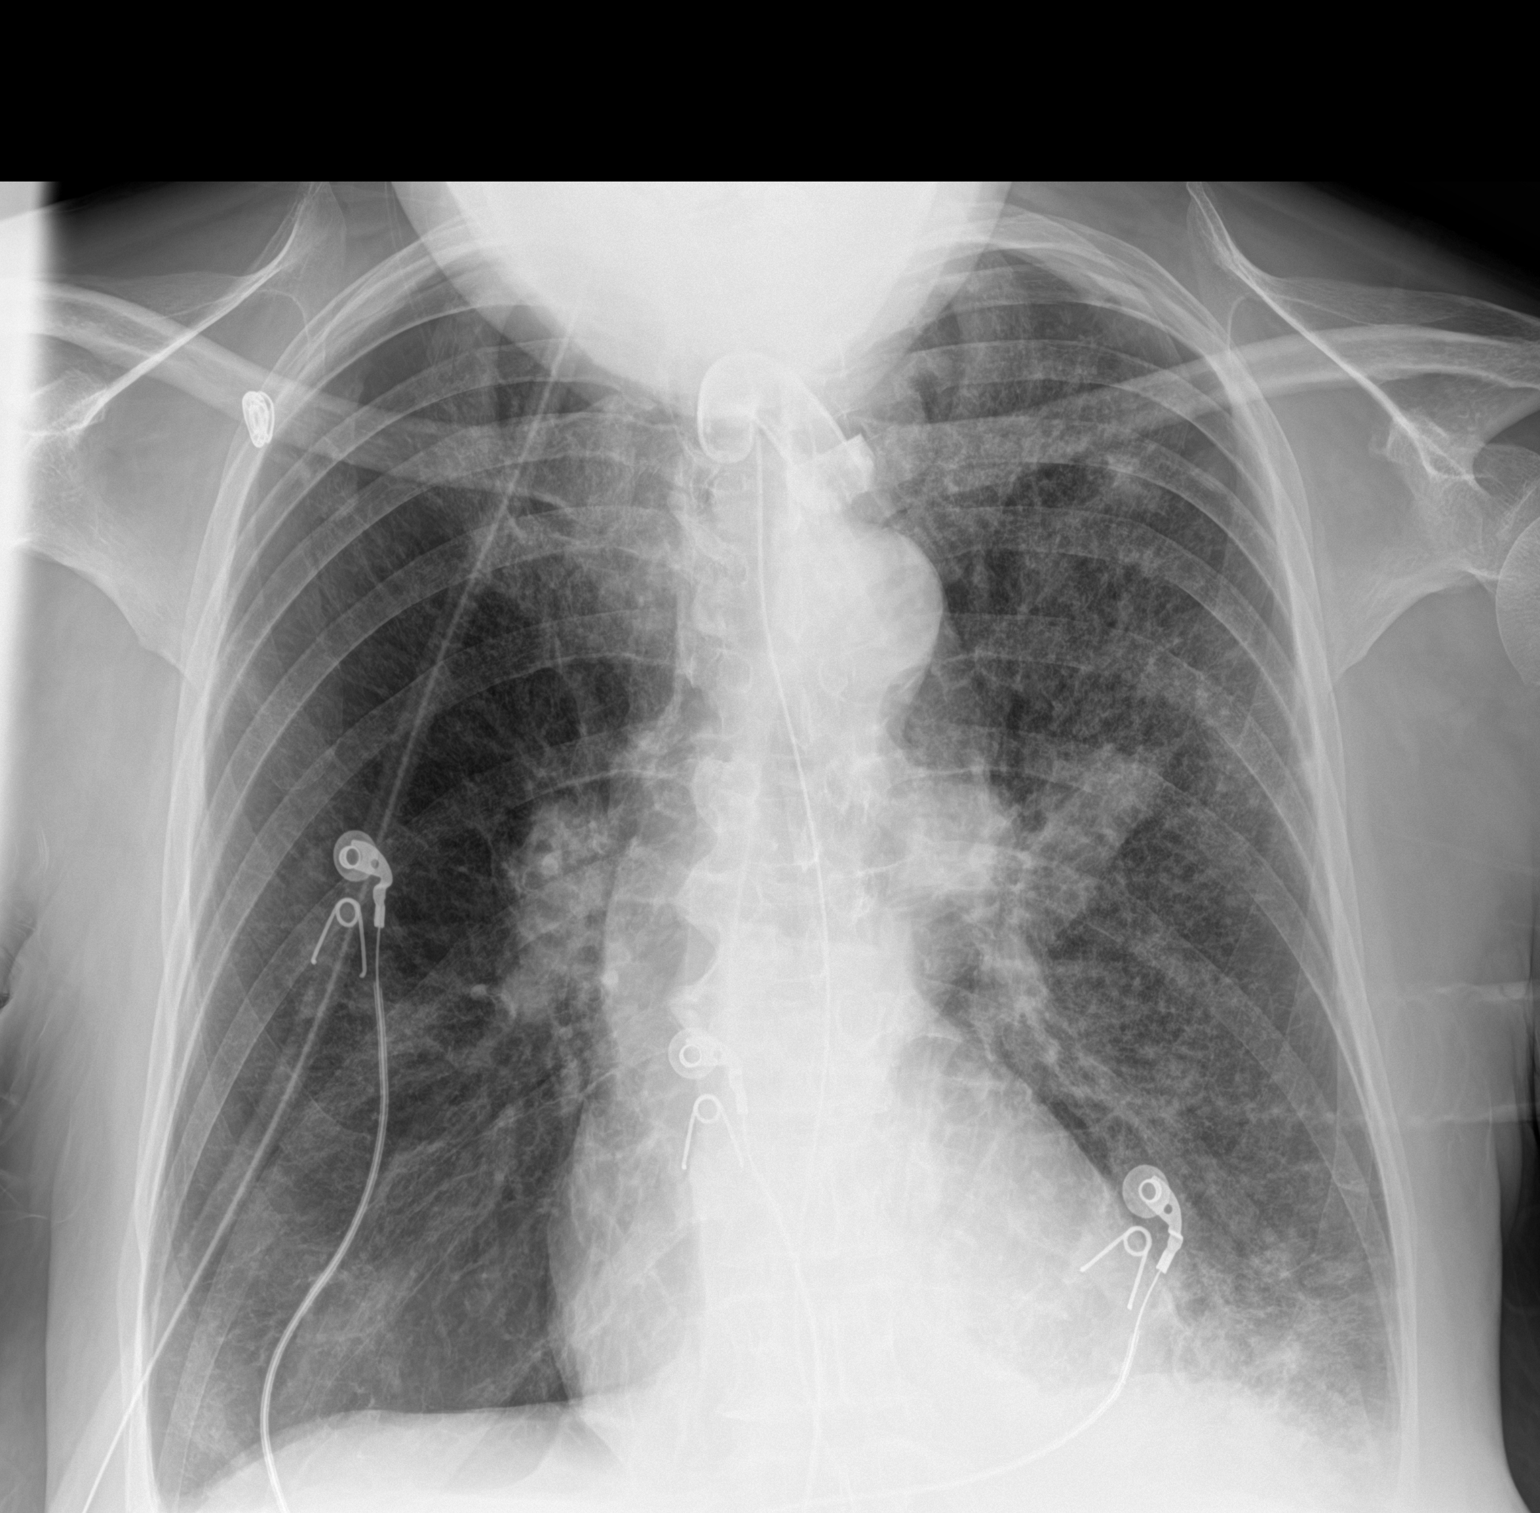

[1 of 1 positions shown; findings below may reference images not displayed]

FINDINGS: Portable AP upright view at [id number] hours. Tracheostomy tube remains in
place, projects over the tracheal air column. Enteric tube remains
in place and courses to the abdomen, tip not included. Large lung
volumes. Tortuous thoracic aorta. Other mediastinal contours are
within normal limits.

Asymmetric increased reticulonodular opacity throughout the left
lung, new from earlier this month. No pneumothorax or definite
effusion. The right lung appears stable. Stable visualized osseous
structures.
IMPRESSION: 1. Chronic pulmonary hyperinflation with new reticulonodular opacity
throughout the left lung. Favor viral/atypical acute infectious
exacerbation. No definite pleural effusion.

2. Stable tracheostomy, visible enteric tube.

## 2022-05-24 IMAGING — DX DG ABDOMEN 1V
1 series · 2 of 2 positions shown · non-contrast
Comparison: 04/09/2020.

CLINICAL DATA: Abdominal distension.

EXAM:
ABDOMEN - 1 VIEW

[Series 1: abdomen · 0.14mm/px · 2 of 2 slices shown]
[im 1/2]
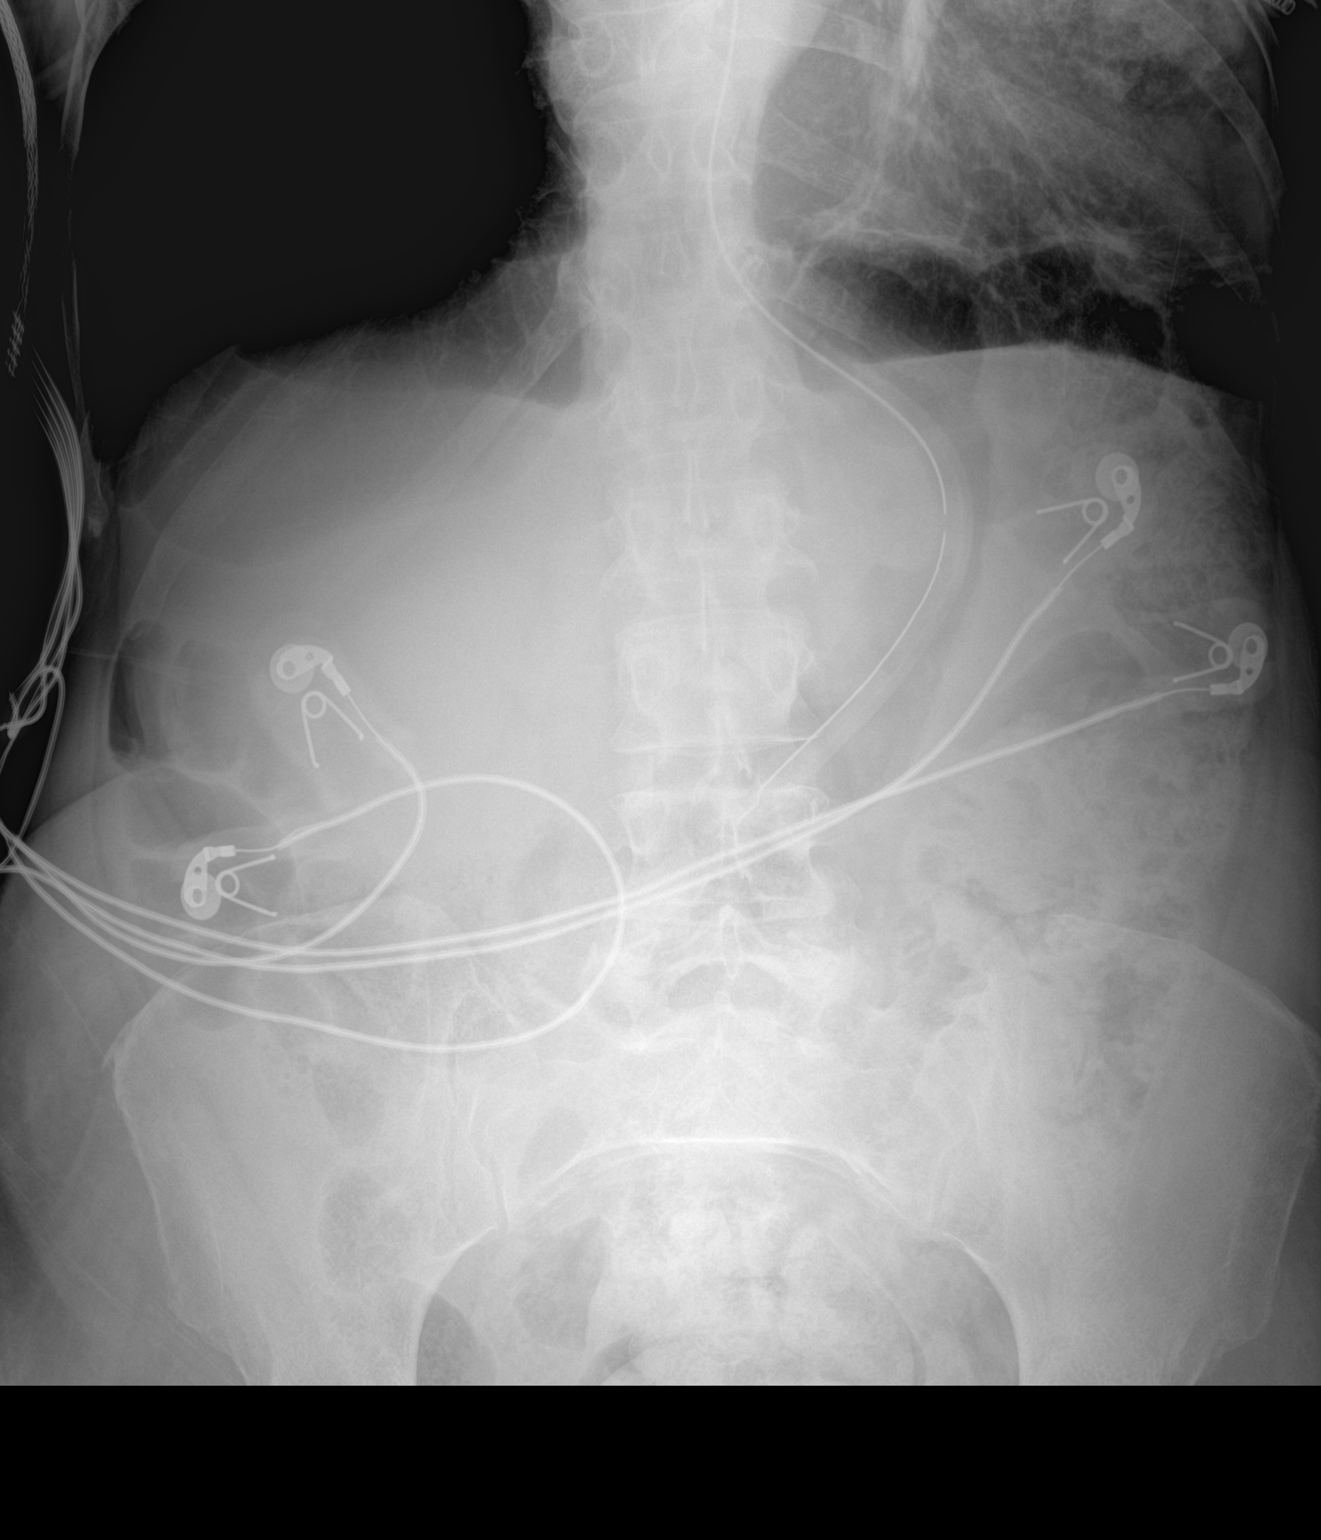
[im 2/2]
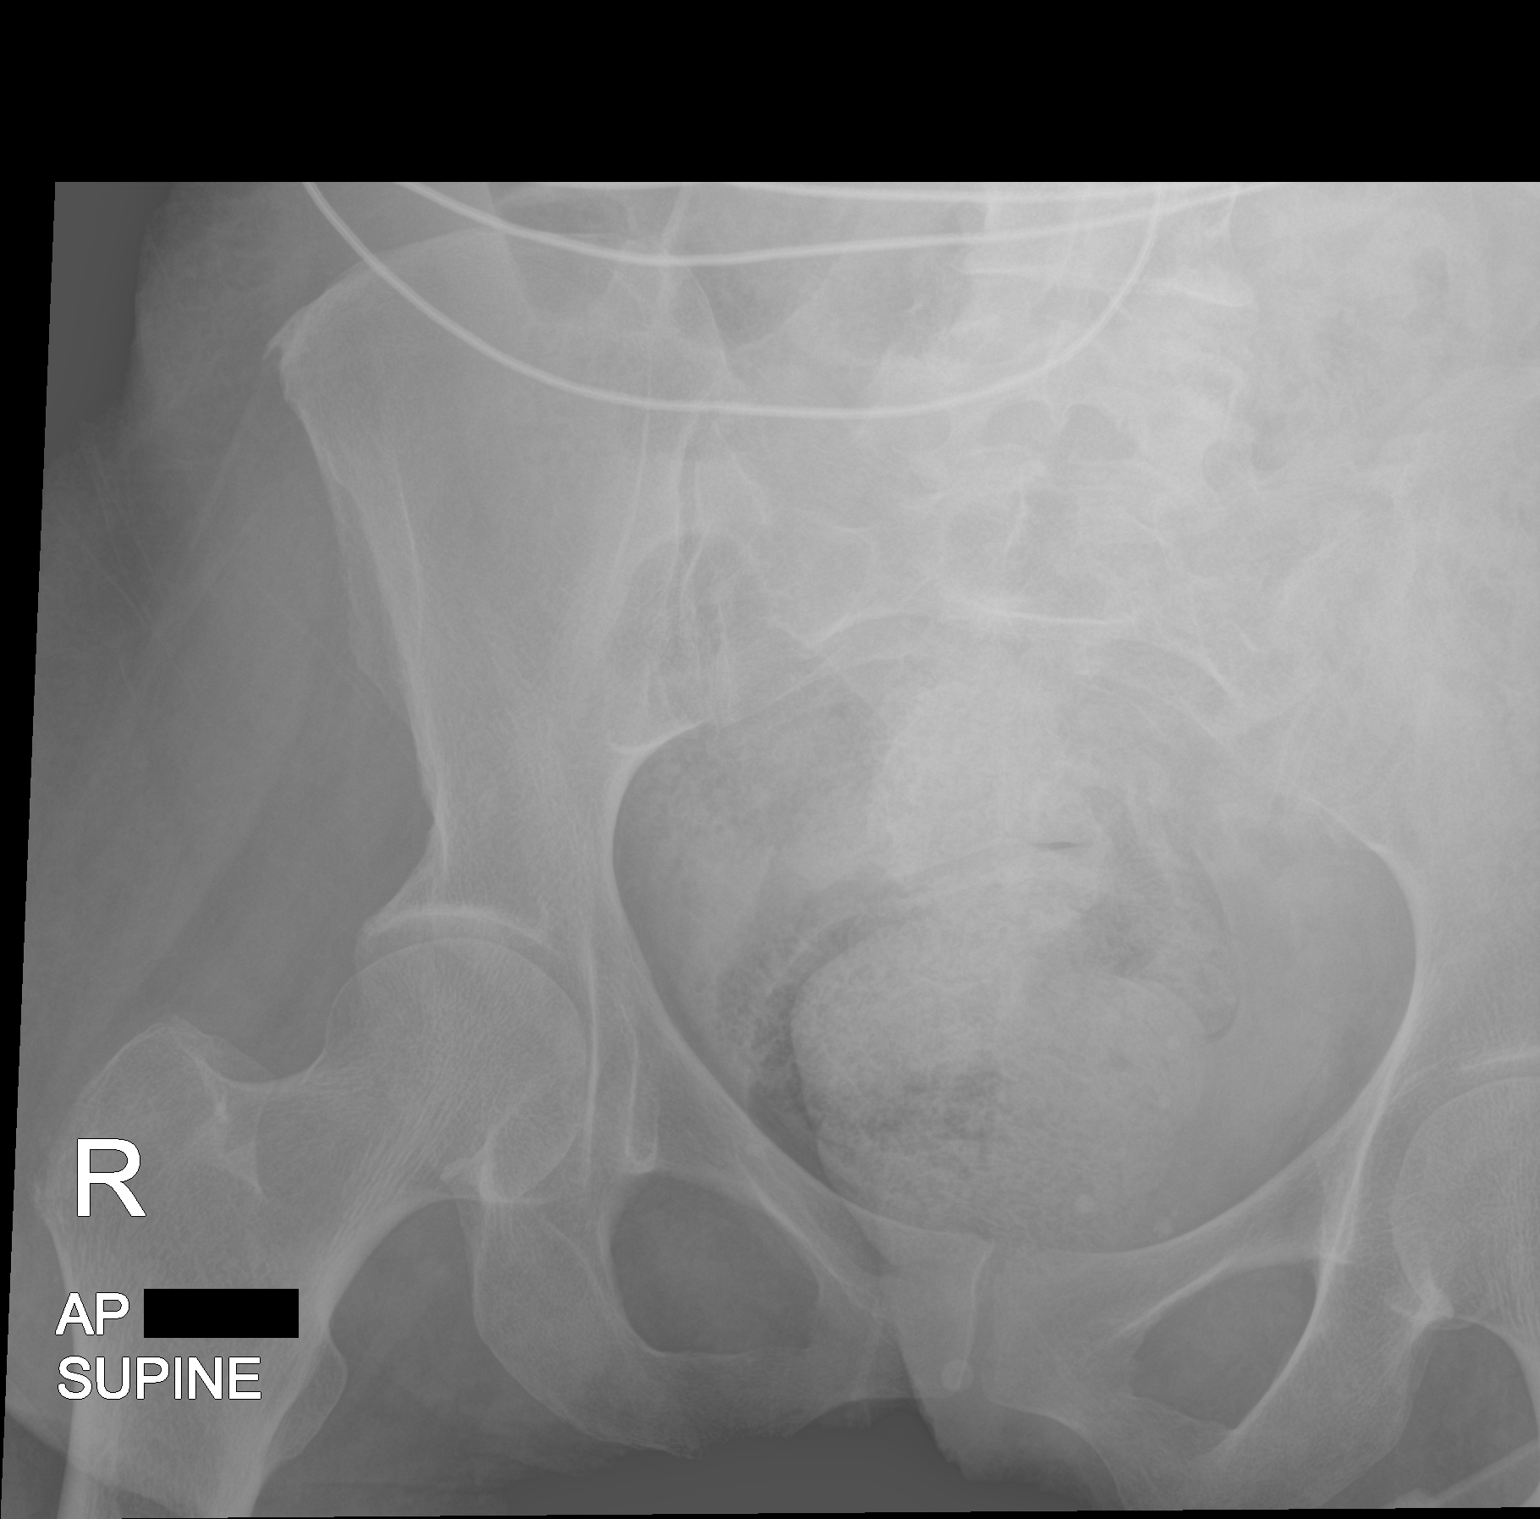

[2 of 2 positions shown; findings below may reference images not displayed]

FINDINGS: Is no bowel dilation to suggest obstruction. Nasal/orogastric tube
tip projects in the distal stomach.

Moderate increased stool distends the rectum. There is mild
generalized increased colonic stool burden.

No evidence of renal or ureteral stones. Soft tissues are
unremarkable.

No acute skeletal abnormality.
IMPRESSION: 1. No acute findings.  No evidence of bowel obstruction.
2. Mild increase in the colonic stool burden with moderate stool
distending the rectum.

## 2022-05-24 IMAGING — DX DG CHEST 1V PORT
1 series · 1 of 1 positions shown · non-contrast
Comparison: 04/17/2020 and older exams.

CLINICAL DATA: Acute respiratory distress.  Abdominal distention.

EXAM:
PORTABLE CHEST 1 VIEW

[chest]
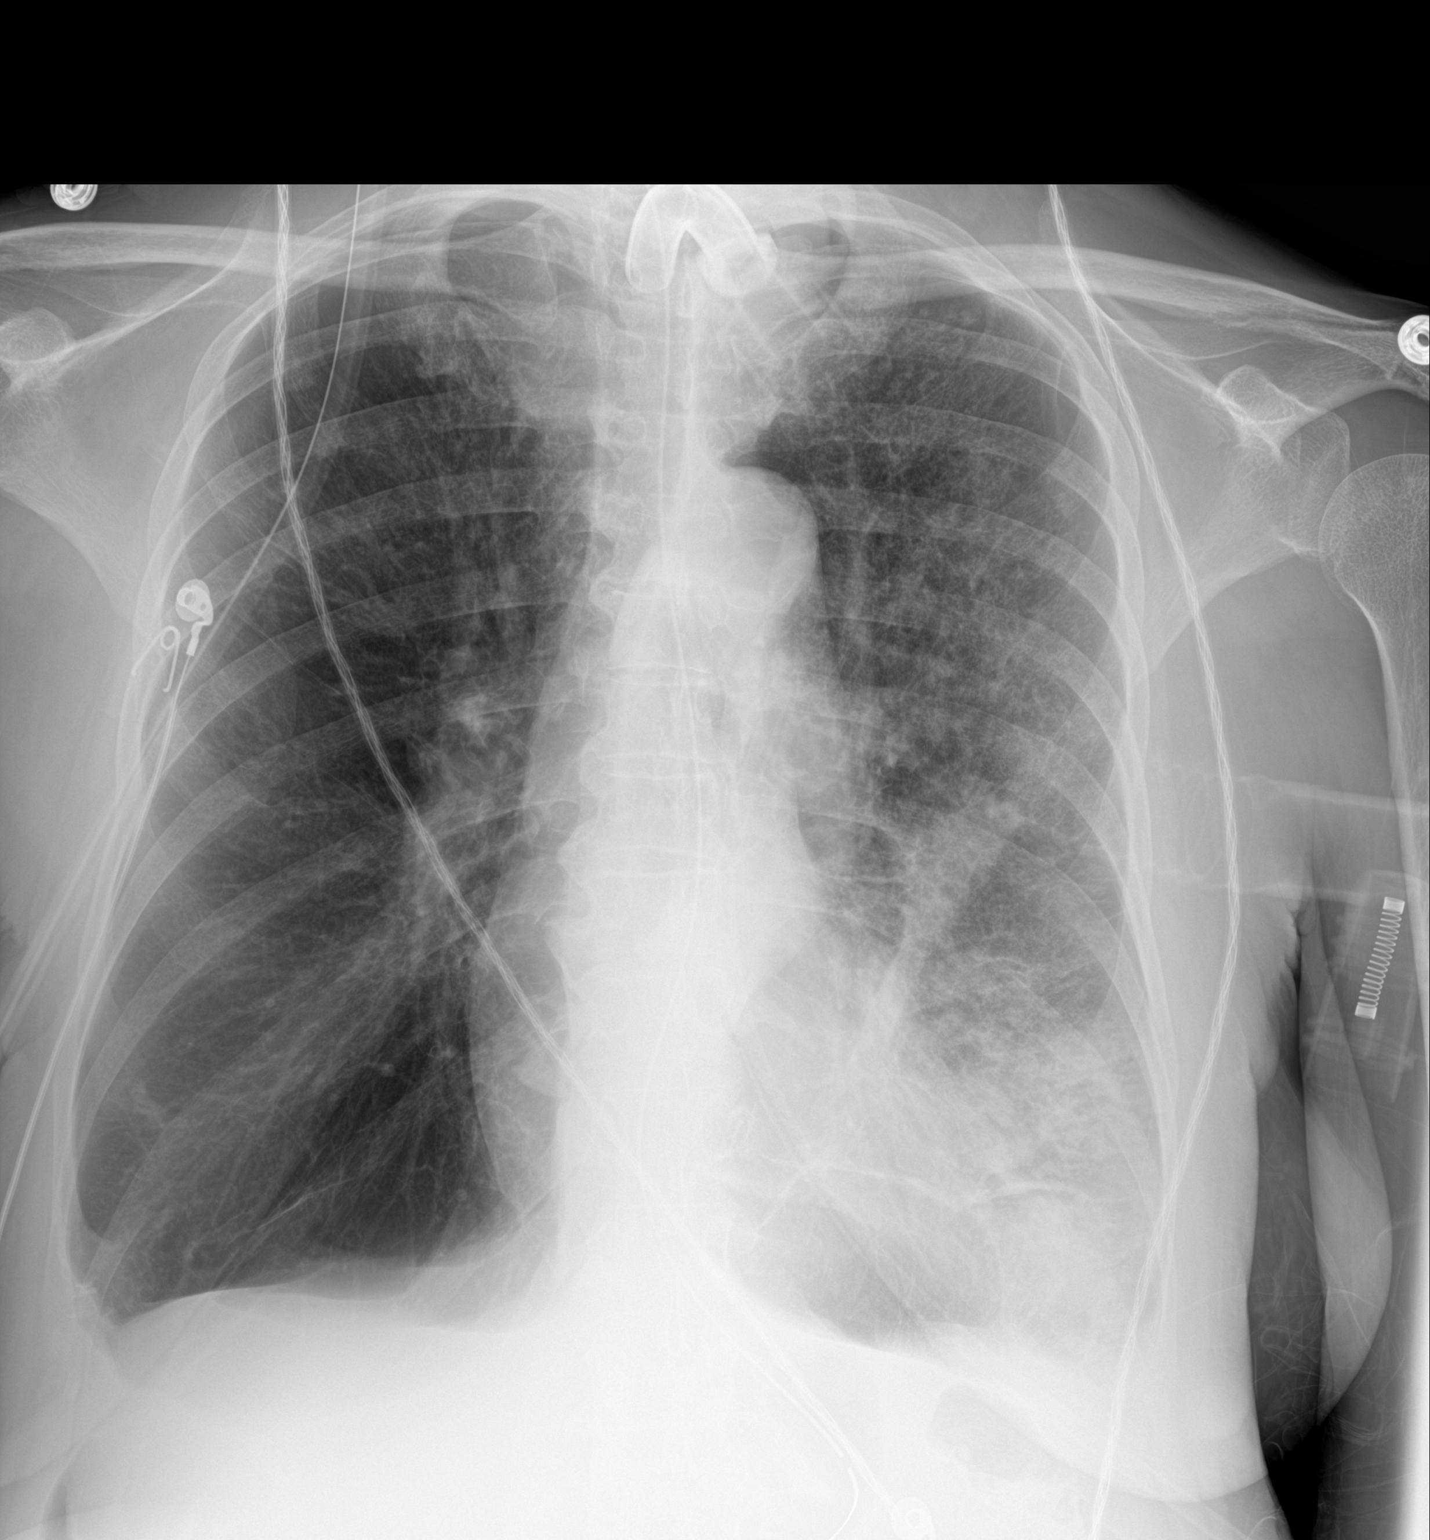

[1 of 1 positions shown; findings below may reference images not displayed]

FINDINGS: Interstitial opacities throughout most of the left lung with hazy
airspace opacities in the left perihilar and lower lung are similar
to the prior exam allowing for differences in patient positioning
and technique. Lungs are hyperexpanded. Right lung is essentially
clear. No convincing pleural effusion and no pneumothorax.

Tracheostomy tube and nasal/orogastric tube are stable and well
positioned.
IMPRESSION: 1. No significant change from the most recent prior study.
2. Left lung interstitial and hazy airspace opacities again noted
consistent with pneumonia.

## 2022-05-27 IMAGING — DX DG ABDOMEN 1V
1 series · 1 of 1 positions shown · non-contrast
Comparison: April 23, 2020.

CLINICAL DATA: Abdominal distension.

EXAM:
ABDOMEN - 1 VIEW

[abdomen]
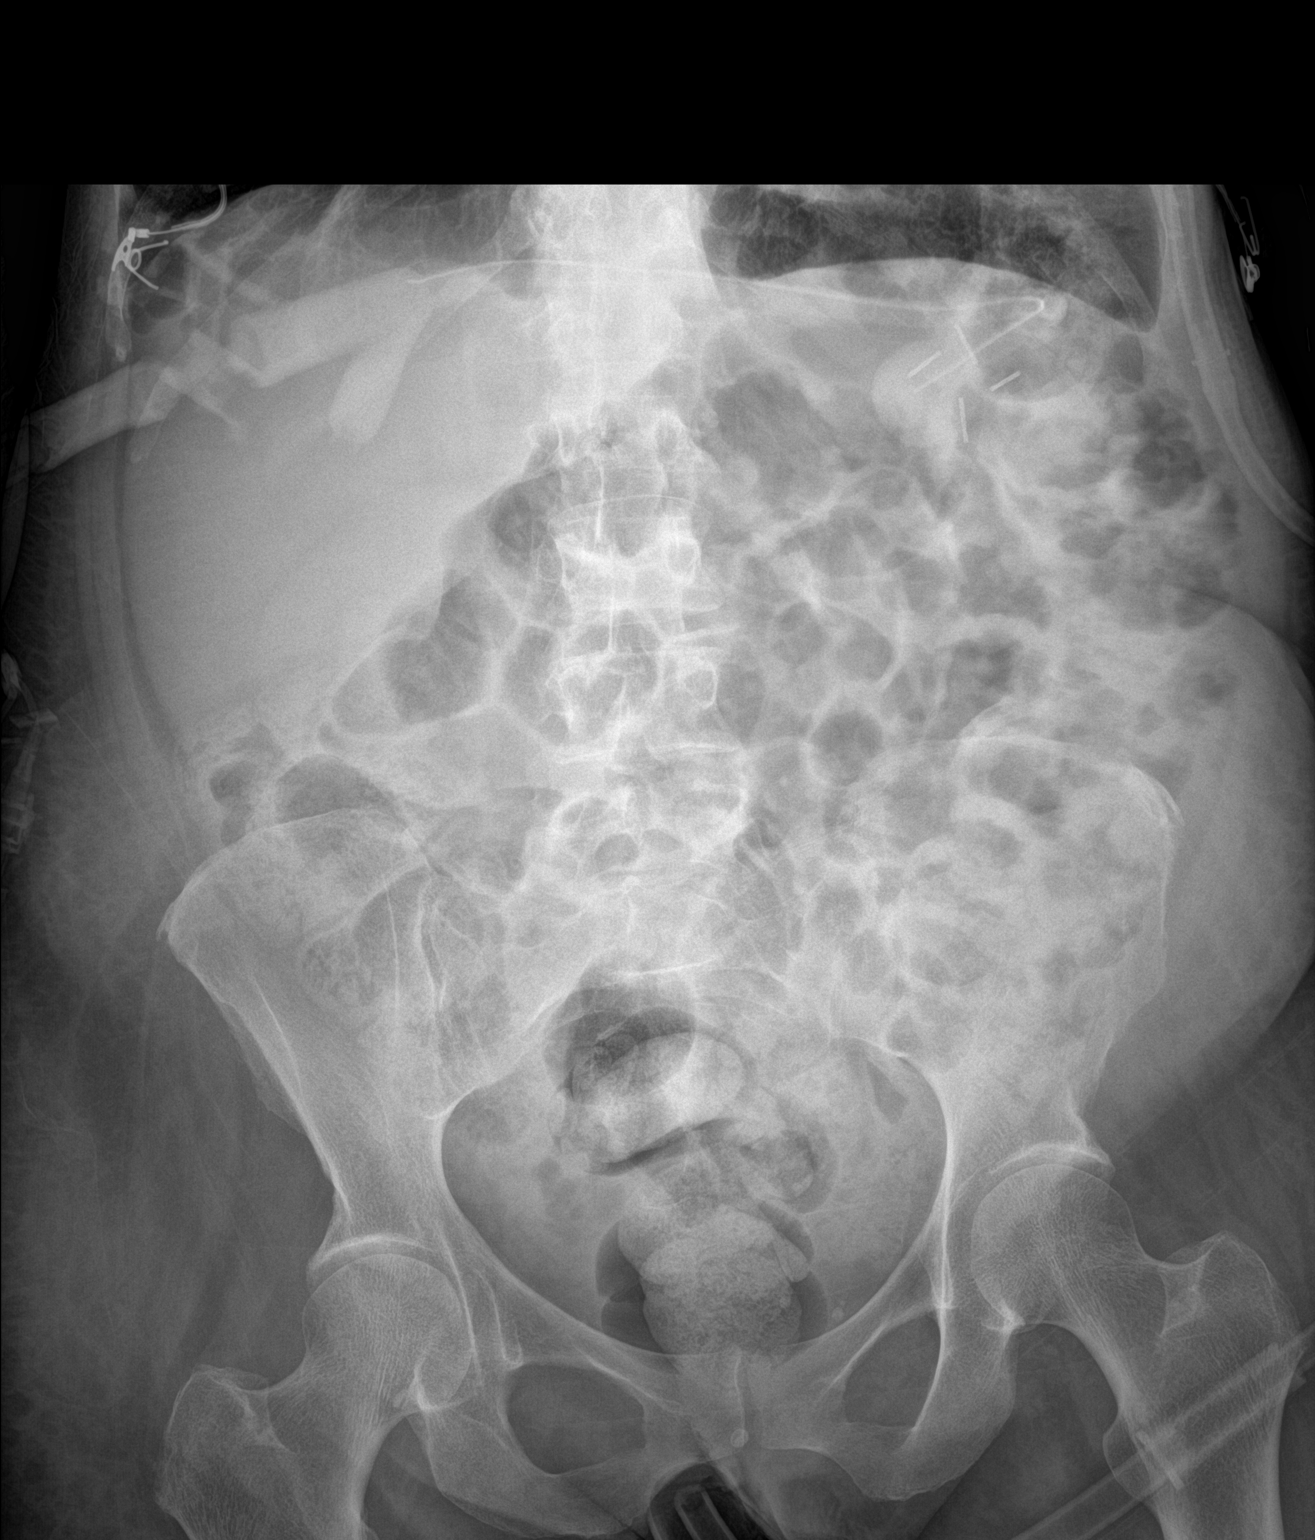

[1 of 1 positions shown; findings below may reference images not displayed]

FINDINGS: The bowel gas pattern is normal. Moderate amount of stool seen
throughout the colon and rectum. No radio-opaque calculi or other
significant radiographic abnormality are seen.
IMPRESSION: Moderate stool burden. No evidence of bowel obstruction.

## 2022-06-06 IMAGING — DX DG ABD PORTABLE 1V
1 series · 1 of 1 positions shown · non-contrast
Comparison: April 26, 2020

CLINICAL DATA: Abdominal pain and distension.

EXAM:
PORTABLE ABDOMEN - 1 VIEW

[abdomen supine]
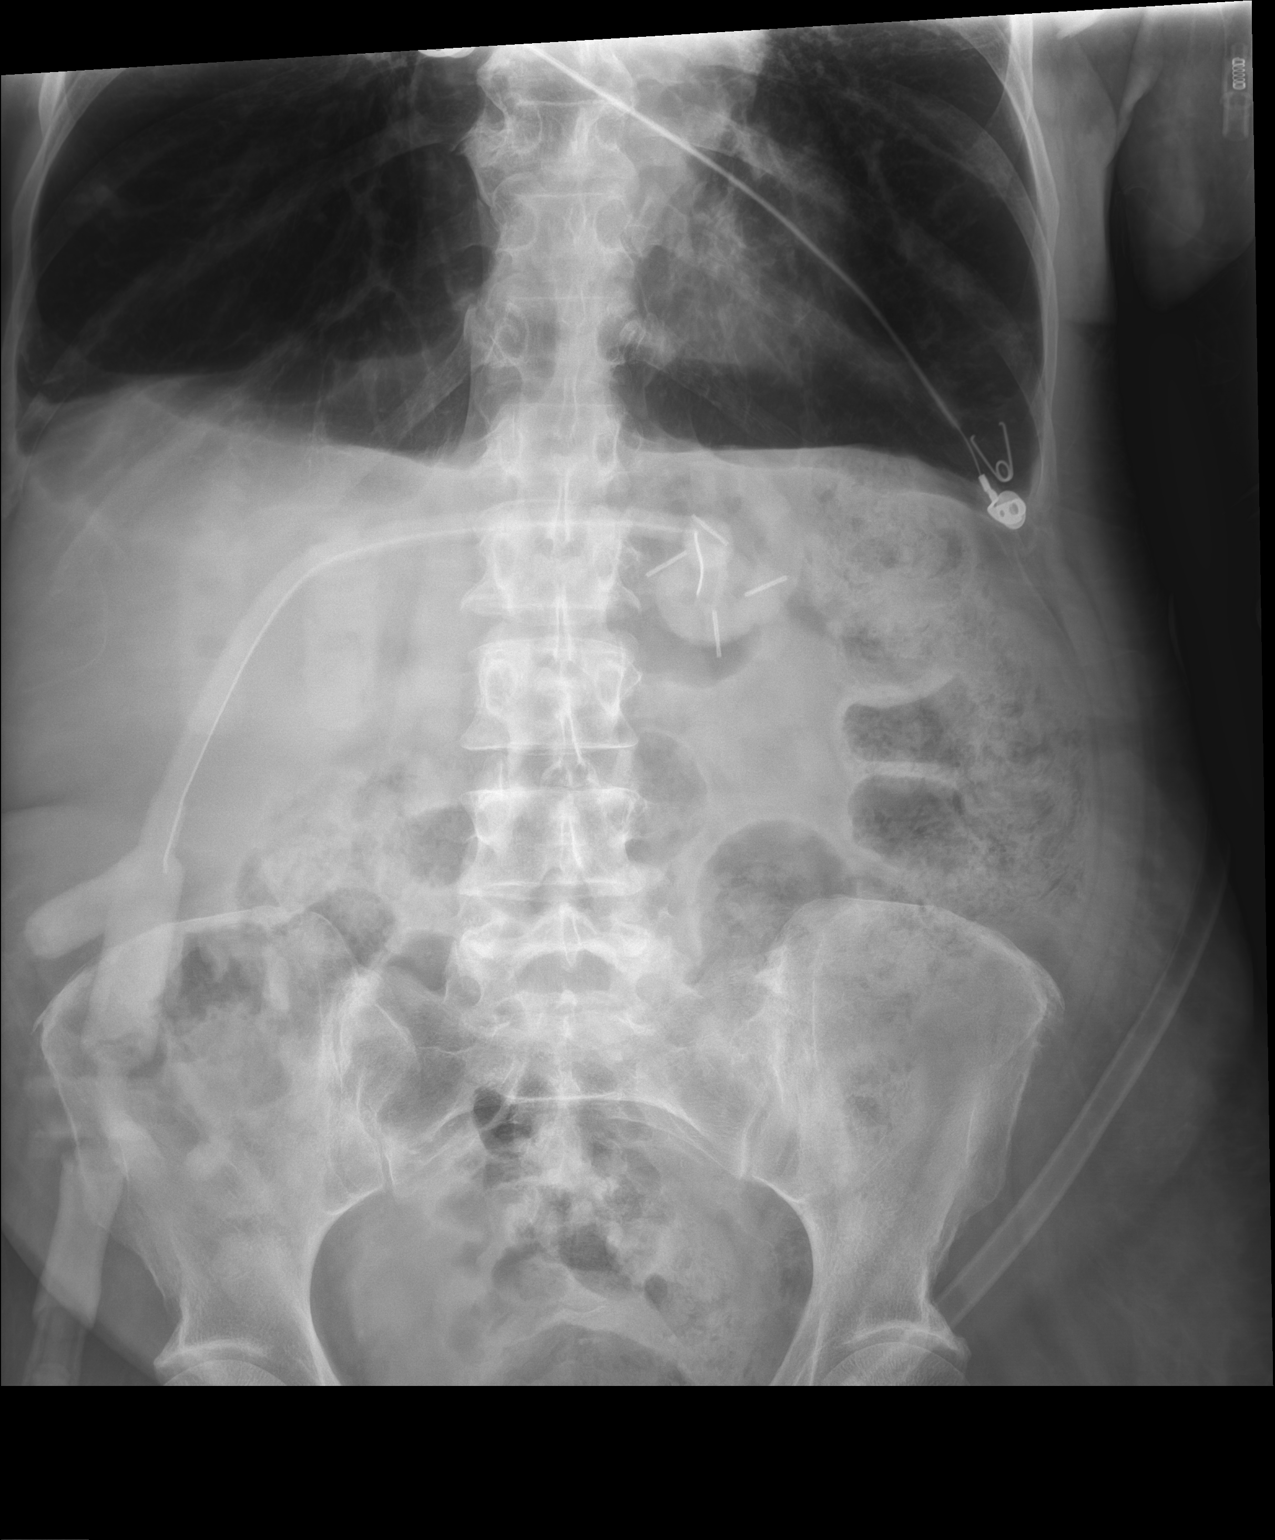

[1 of 1 positions shown; findings below may reference images not displayed]

FINDINGS: There is stable percutaneous gastrostomy tube positioning. The bowel
gas pattern is normal. A large amount of stool is seen throughout
the colon. No radio-opaque calculi or other significant radiographic
abnormality are seen.
IMPRESSION: 1. Large stool burden without evidence of bowel obstruction.

## 2022-06-14 IMAGING — DX DG ABD PORTABLE 1V
1 series · 1 of 1 positions shown · non-contrast
Comparison: 05/06/2020

CLINICAL DATA: Distended abdomen

EXAM:
PORTABLE ABDOMEN - 1 VIEW

[abdomen kub]
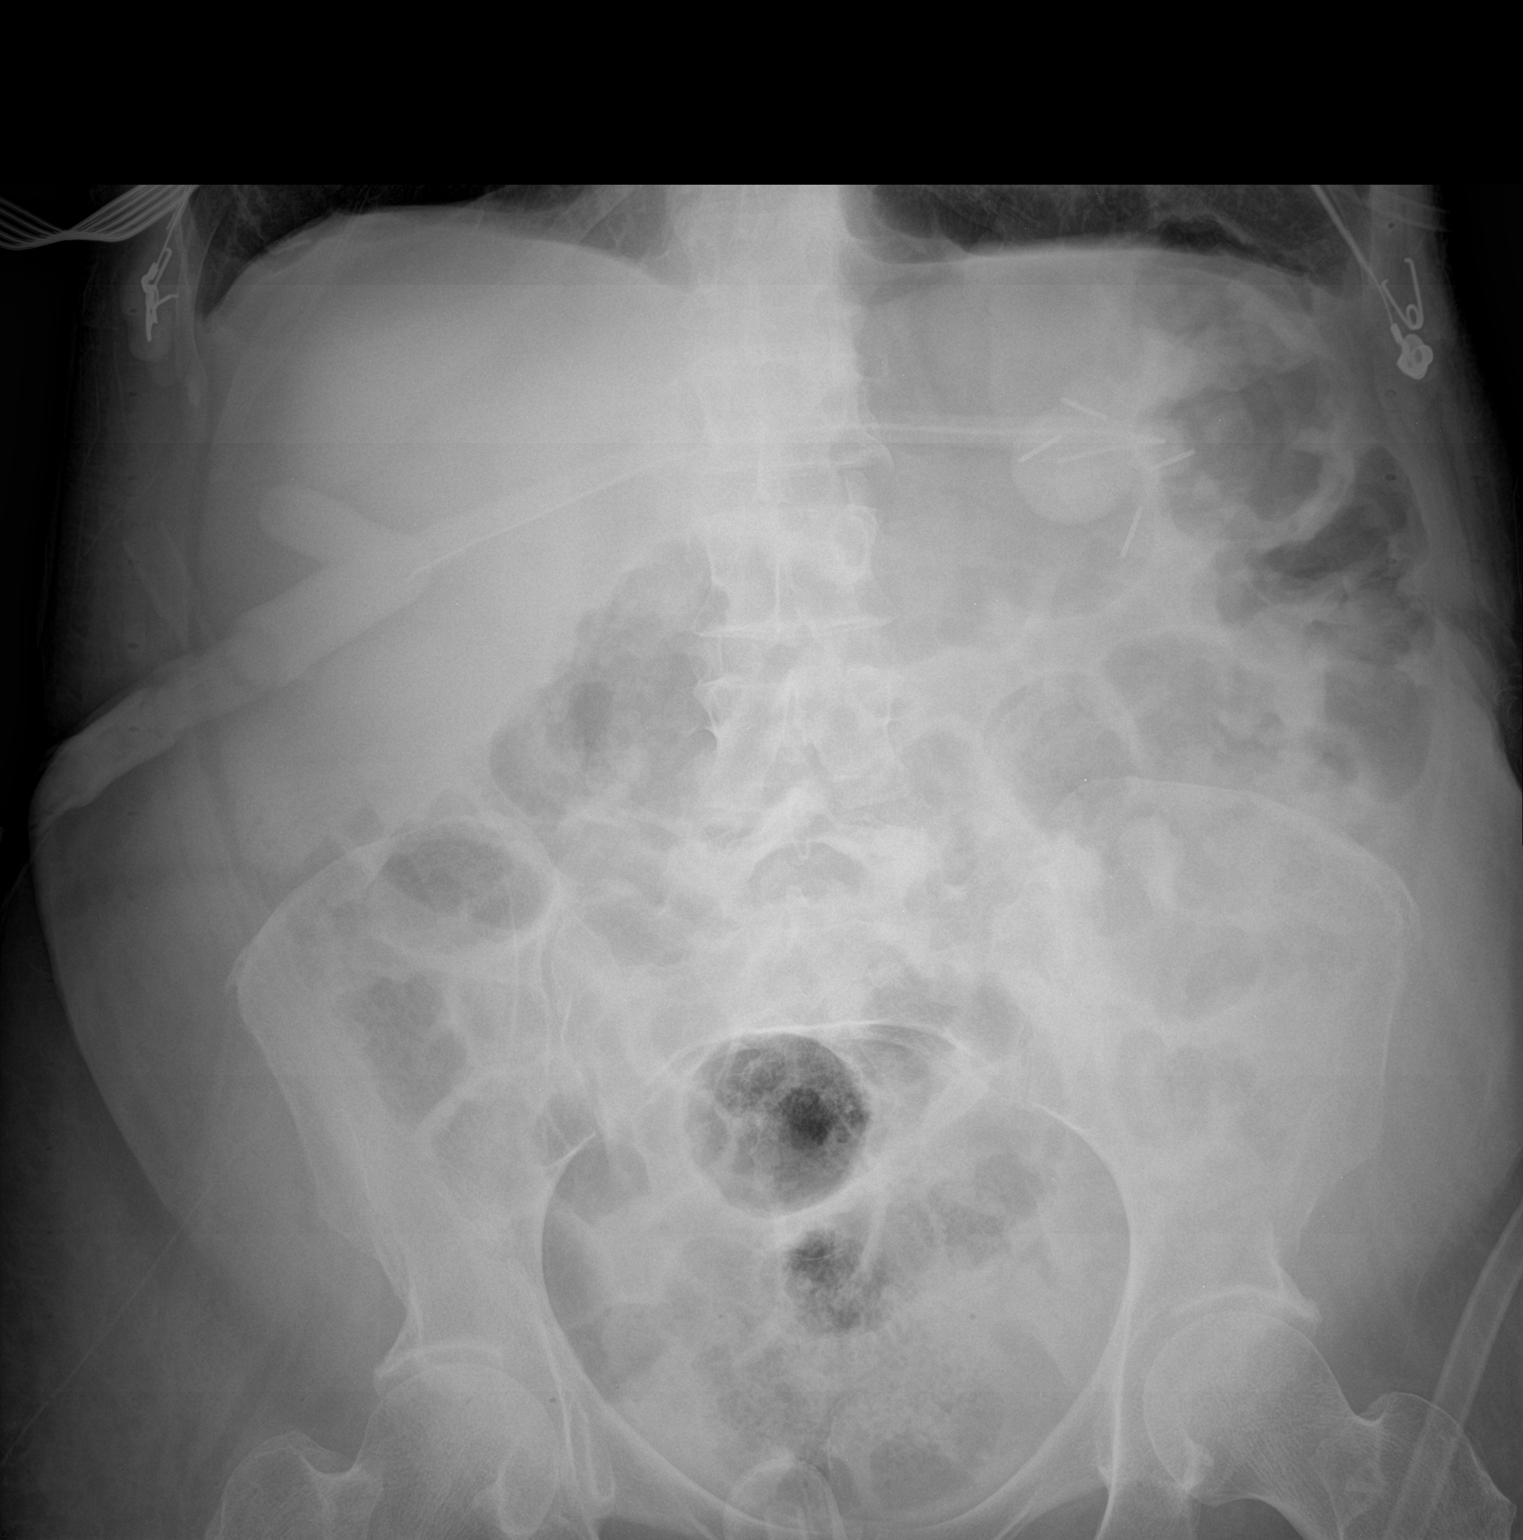

[1 of 1 positions shown; findings below may reference images not displayed]

FINDINGS: Gas in nondilated large and small bowel. Negative for bowel
obstruction or ileus. Gastrostomy tube overlying the left upper
quadrant unchanged.
IMPRESSION: Negative.

## 2022-06-15 IMAGING — DX DG CHEST 1V
1 series · 1 of 1 positions shown · non-contrast
Comparison: 05/12/2020

CLINICAL DATA: Respiratory failure

EXAM:
CHEST  1 VIEW

[chest]
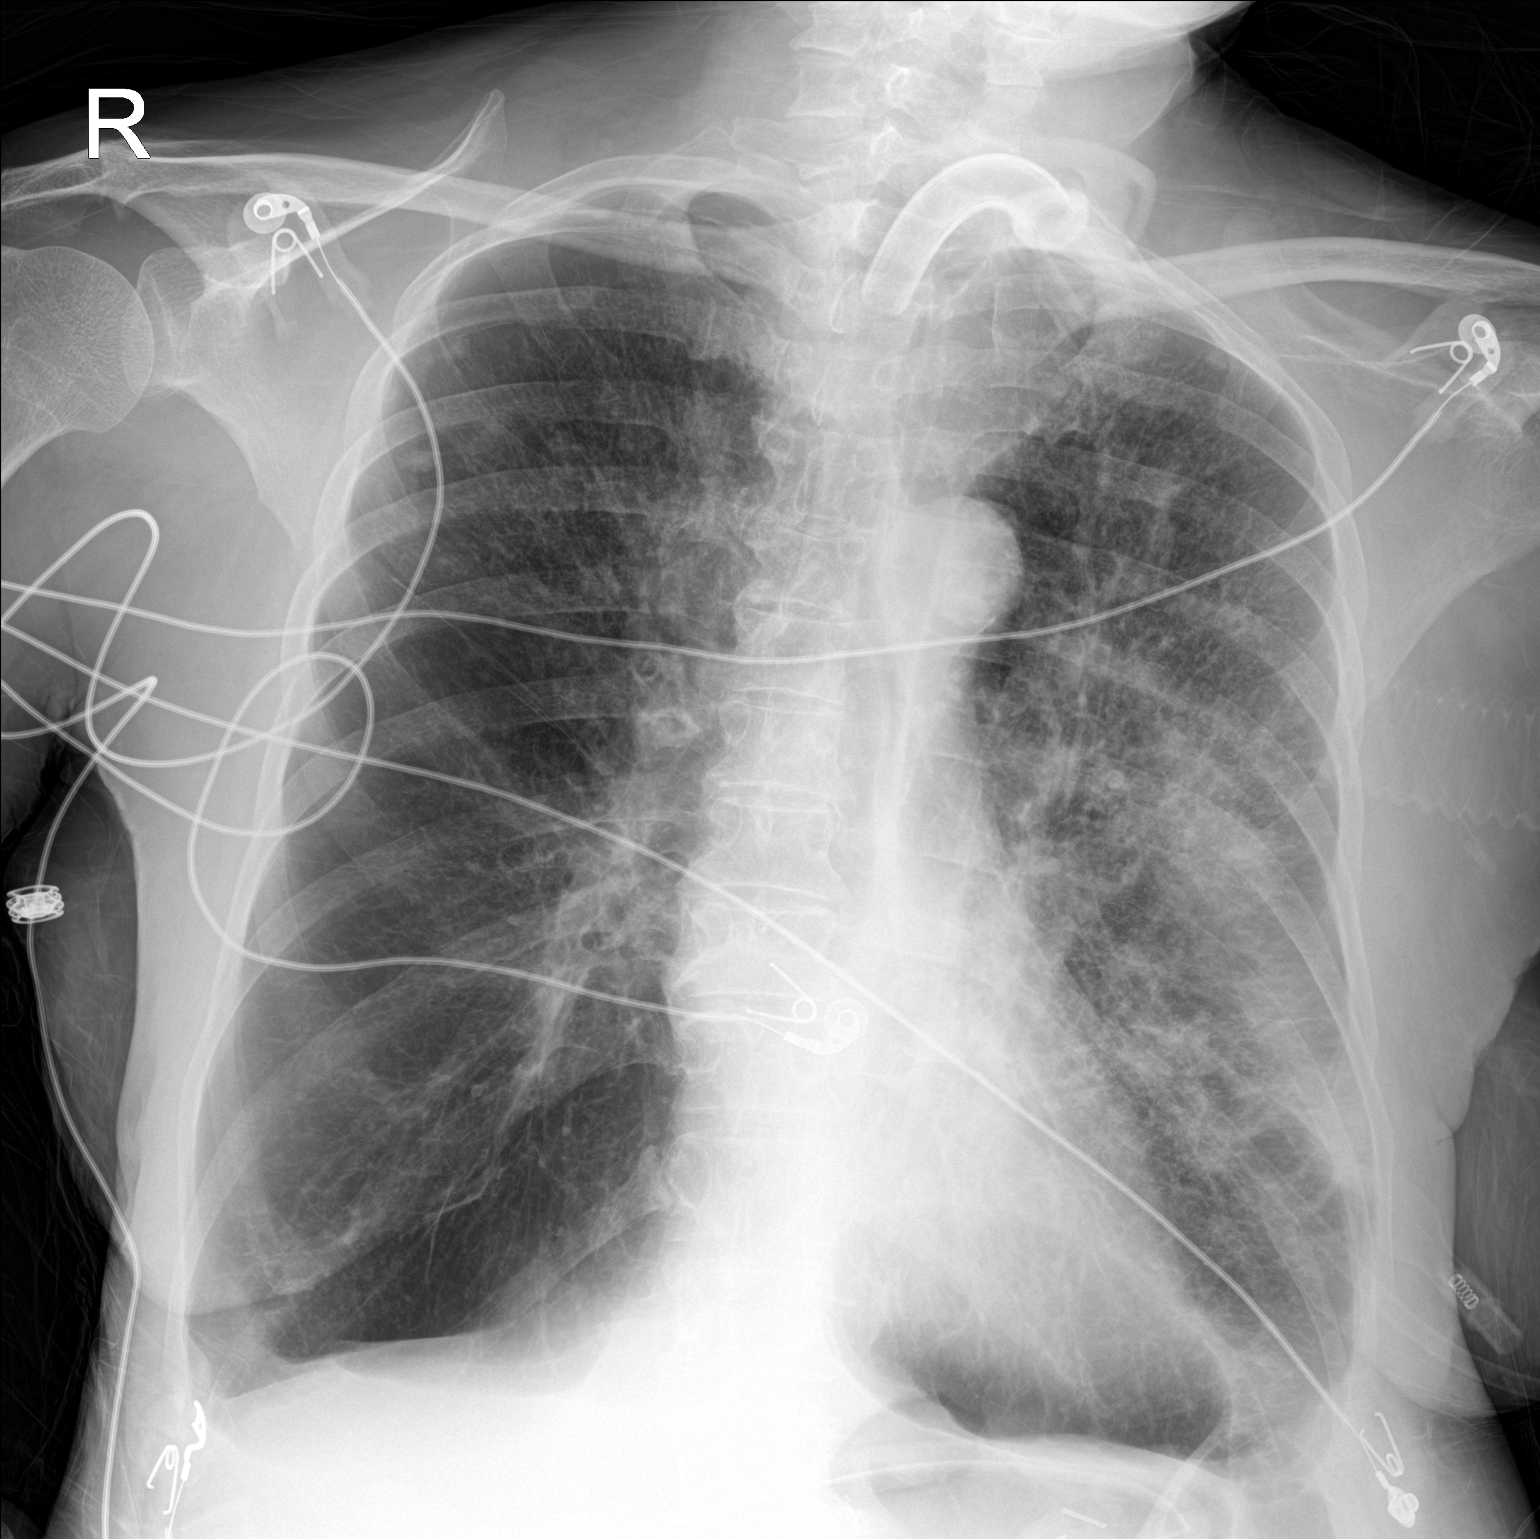

[1 of 1 positions shown; findings below may reference images not displayed]

FINDINGS: Tracheostomy again noted. The lungs are moderately hyperinflated and
pulmonary insufflation has progressed since prior examination.
Superimposed airspace infiltrate throughout the left mid lung zone
has progressed in the interval, likely infectious in the appropriate
clinical setting. Stable granuloma within the right upper lung zone.
No pneumothorax or pleural effusion. Cardiac size within normal
limits. The pulmonary vascularity is normal.
IMPRESSION: Progressive pulmonary infiltrate throughout the left mid lung zone,
likely infectious in the appropriate clinical setting.

Progressive pulmonary hyperinflation suggesting obstructive
pulmonary disease.

## 2022-06-20 IMAGING — DX DG CHEST 1V PORT
1 series · 1 of 1 positions shown · non-contrast
Comparison: Five days ago

CLINICAL DATA: Acute on chronic respiratory failure

EXAM:
PORTABLE CHEST 1 VIEW

[chest ap]
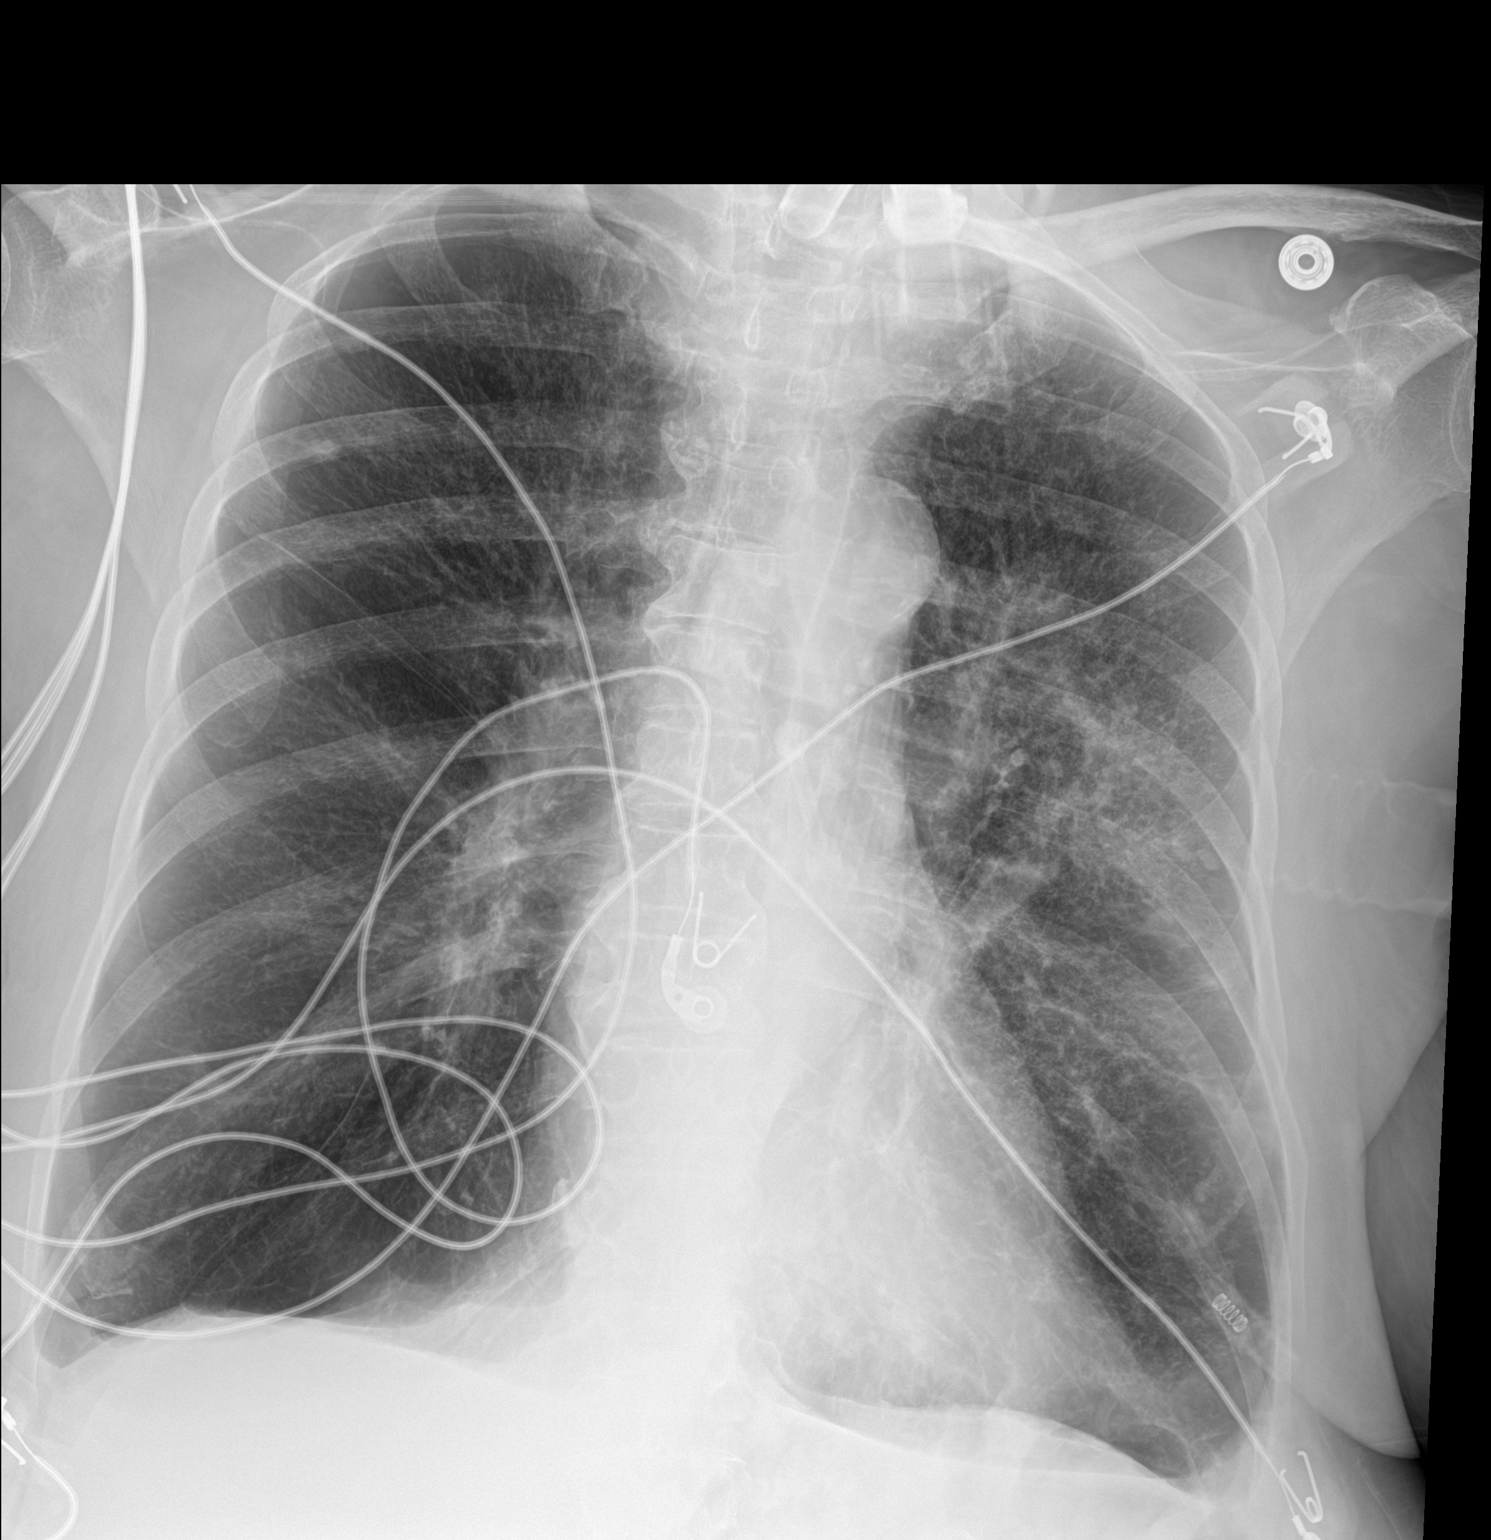

[1 of 1 positions shown; findings below may reference images not displayed]

FINDINGS: Tracheostomy tube in place.

Hyperinflated lung with left airspace disease. Calcified pulmonary
nodules and mediastinal lymph nodes. Normal heart size. No visible
air leak. Blunting at the lateral costophrenic sulci is stable and
likely from hyperinflation rather than trace effusion.
IMPRESSION: COPD and left-sided pneumonia.  No interval change.

## 2022-06-28 IMAGING — DX DG CHEST 1V PORT
1 series · 1 of 1 positions shown · non-contrast
Comparison: Radiographs 05/20/2020 and 05/15/2020.  CT 02/28/2020.

CLINICAL DATA: Pneumonia.

EXAM:
PORTABLE CHEST 1 VIEW

[chest]
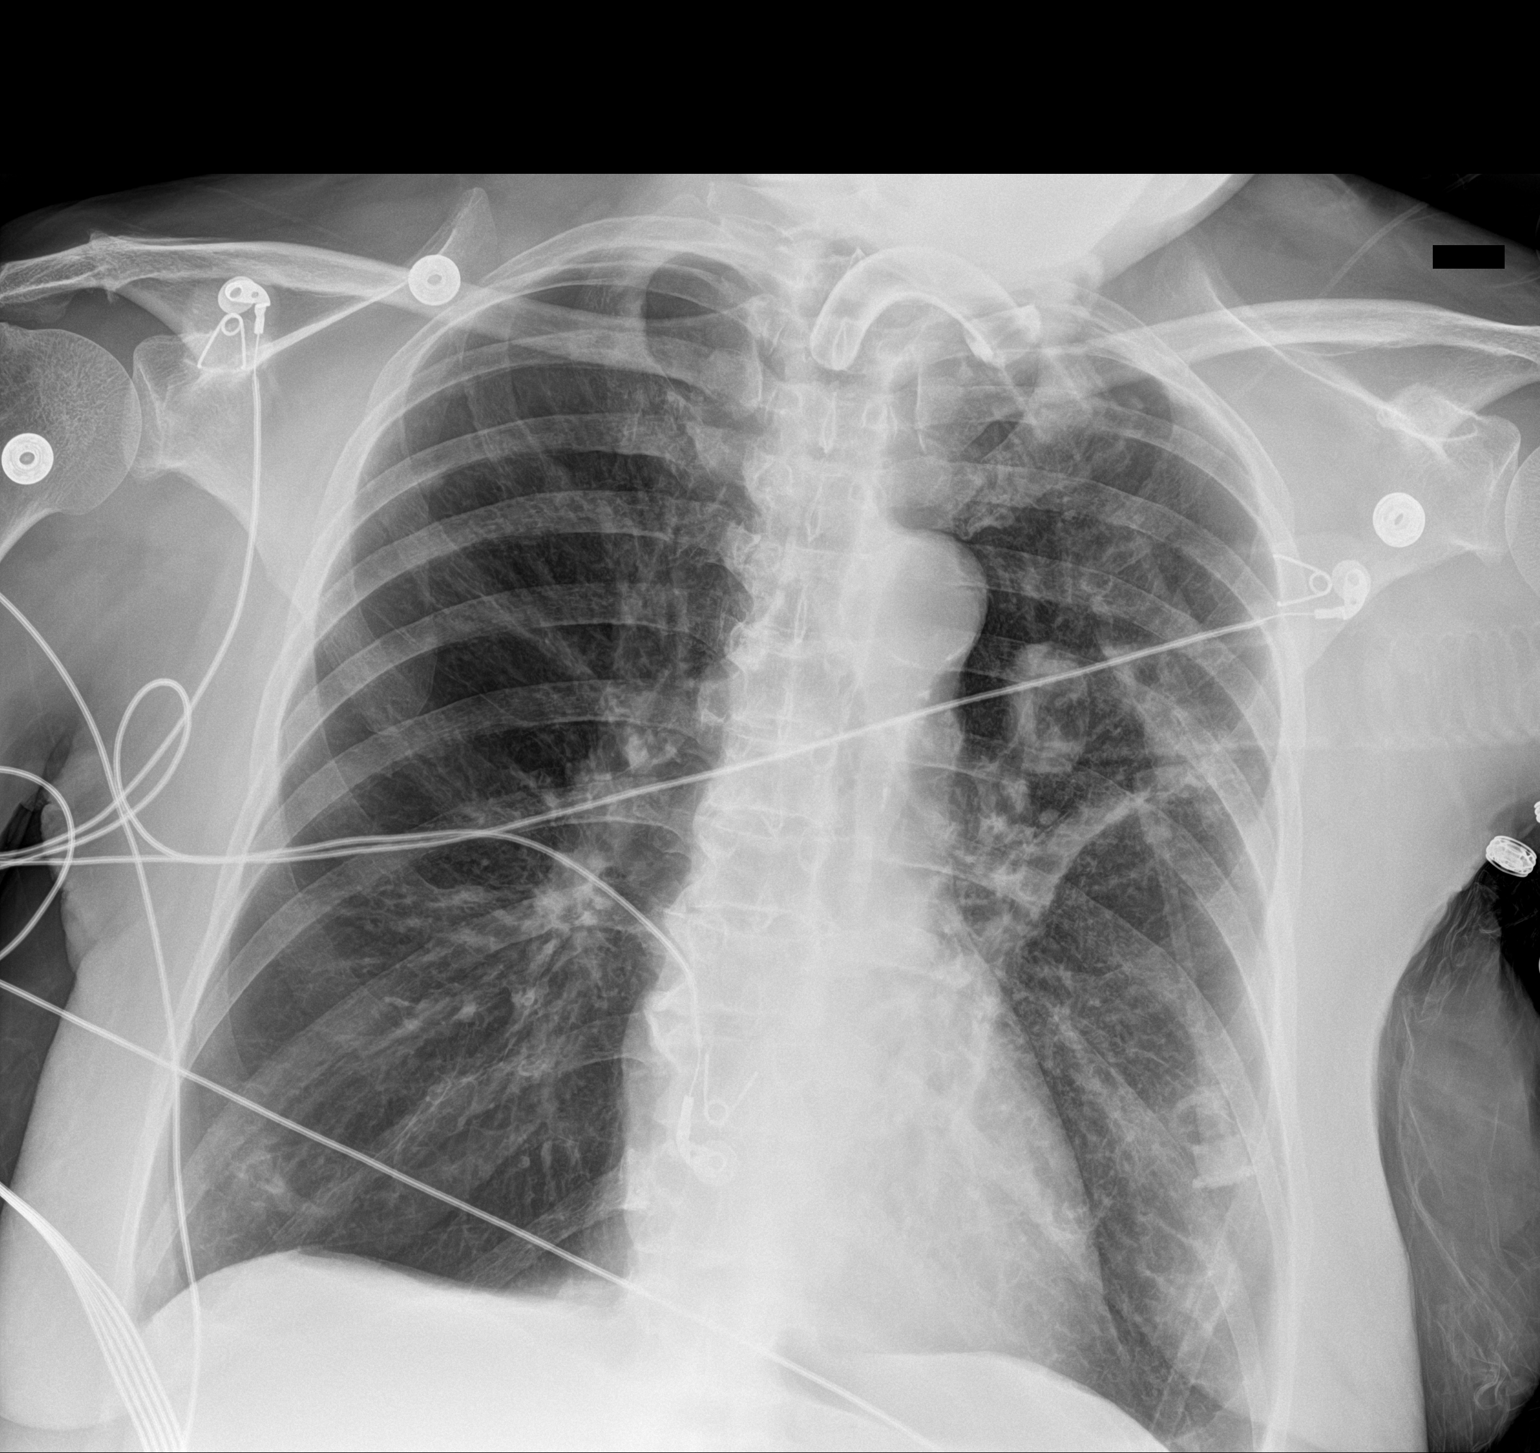

[1 of 1 positions shown; findings below may reference images not displayed]

FINDINGS: 3606 hours. Tracheostomy appears unchanged. The heart size and
mediastinal contours are stable. There has been interval partial
clearing of the left lung airspace disease with residual linear
opacities and mild volume loss in the left perihilar region. The
lungs remain hyperinflated. There is evidence of prior granulomatous
disease with calcified mediastinal lymph nodes. No pneumothorax or
significant pleural effusion.
IMPRESSION: Improving left lung airspace disease with residual linear opacities
and volume loss in the left perihilar region.

## 2022-07-01 NOTE — Progress Notes (Signed)
This encounter was created in error - please disregard.

## 2022-07-04 IMAGING — DX DG CHEST 1V PORT
2 series · 2 of 2 positions shown · non-contrast
Comparison: Radiograph 05/28/2020.

CLINICAL DATA: Worsening shortness of breath. Recent hospital
discharge Mirjana Dalibor Tepavac.

EXAM:
PORTABLE CHEST 1 VIEW

[chest ap (1 of 2)]
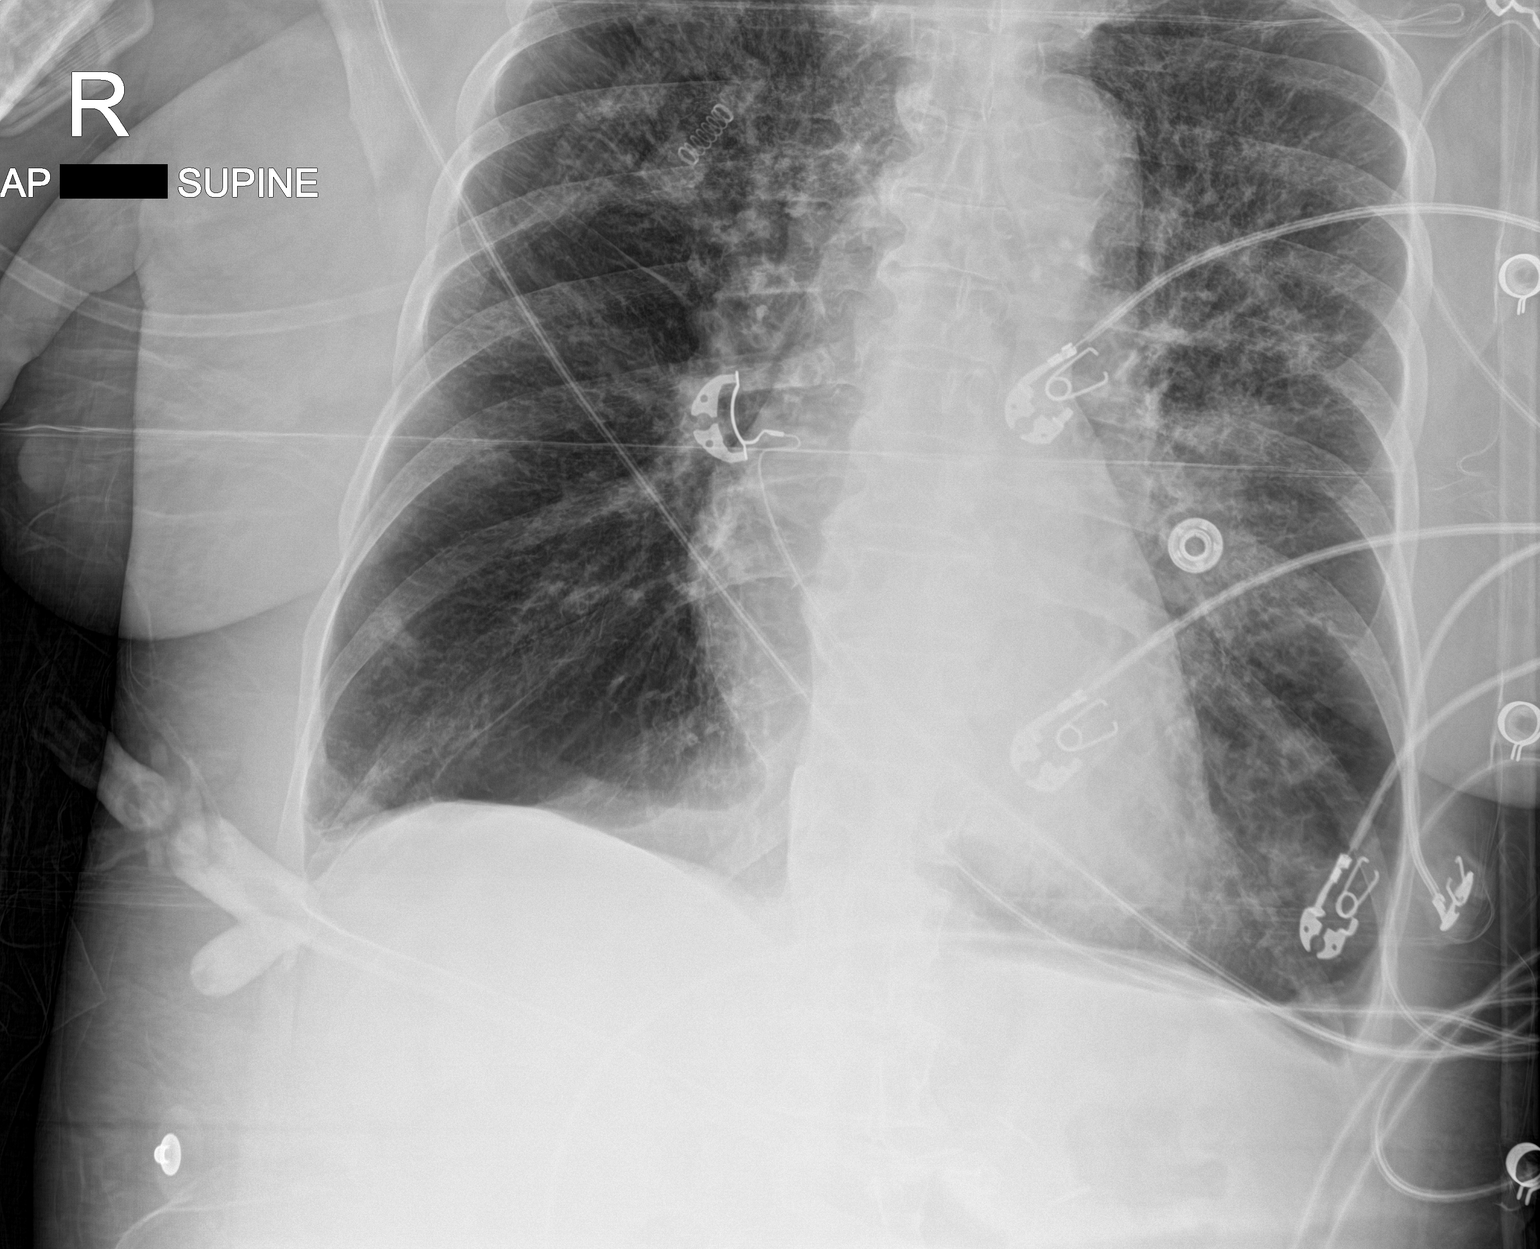

[chest ap (2 of 2)]
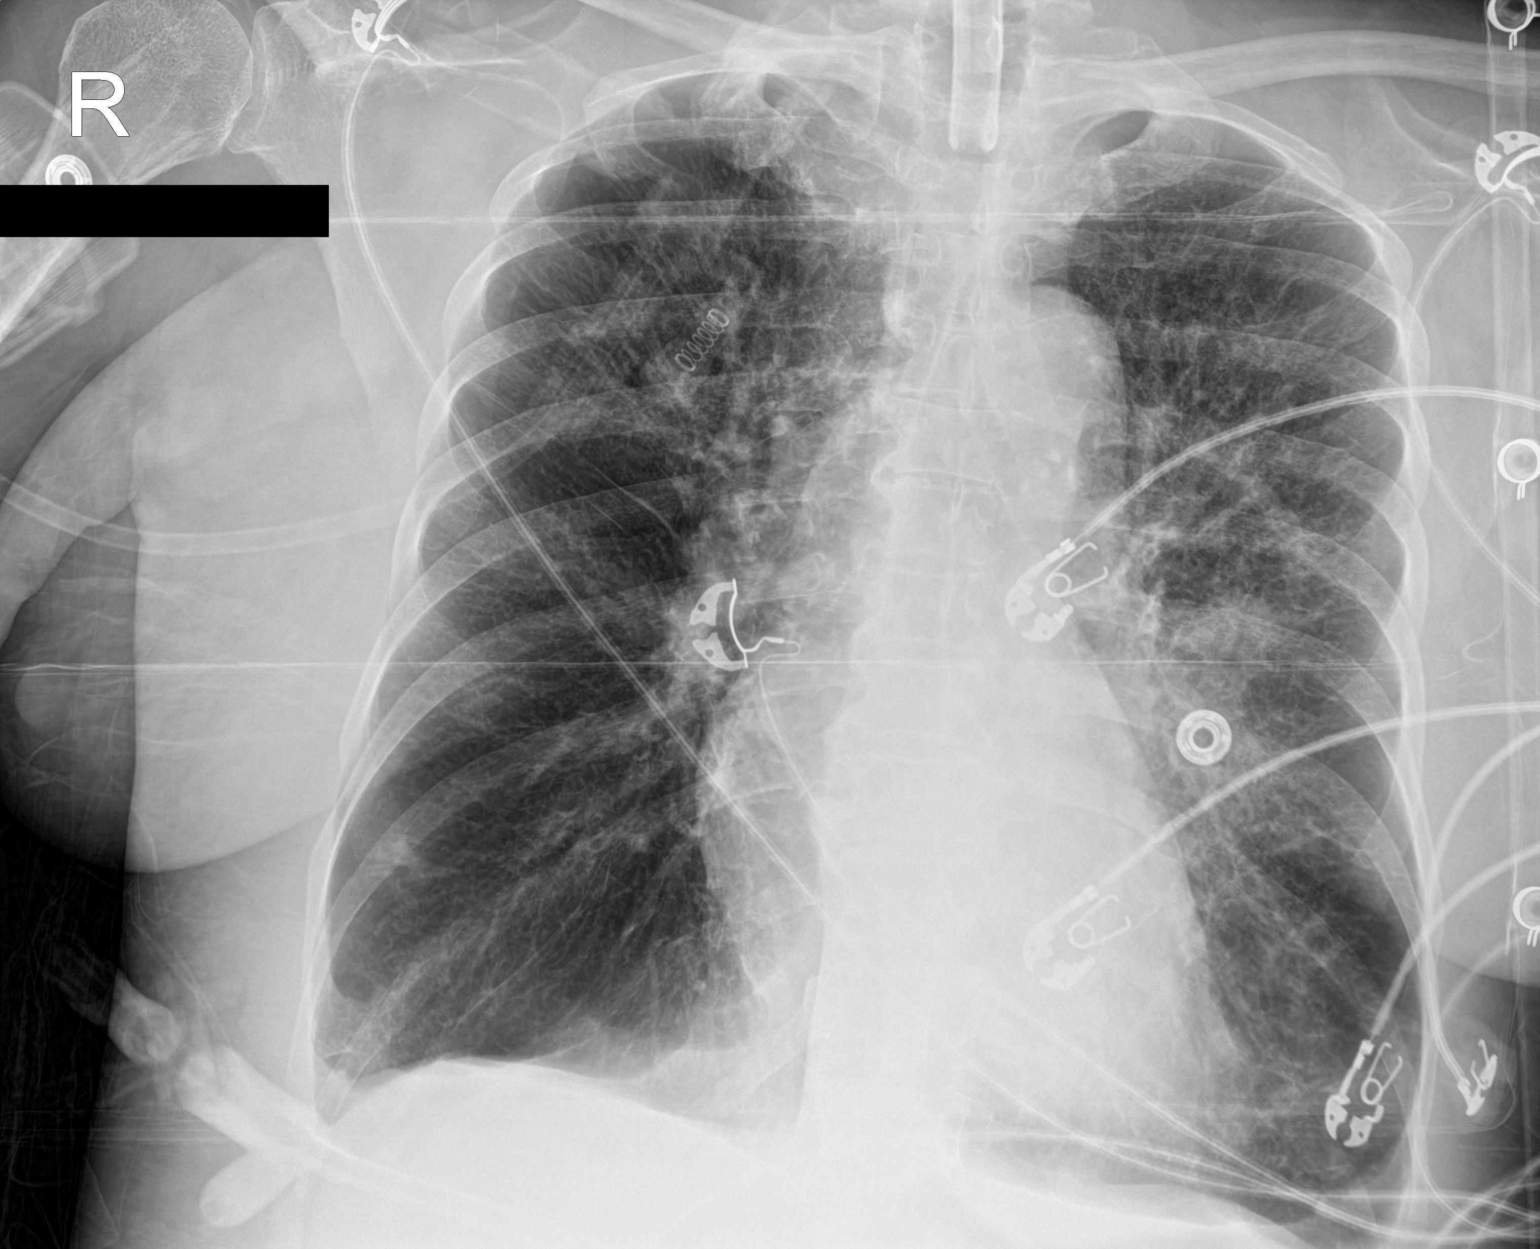

[2 of 2 positions shown; findings below may reference images not displayed]

FINDINGS: Tracheostomy tube only partially included in the field of view, tip
at the thoracic inlet. The lungs are hyperinflated. Stable heart
size and mediastinal contours. Worsening left perihilar airspace
disease with increasing patchy opacities and bronchial thickening.
Increasing peribronchial thickening throughout the right lung. No
pneumothorax. No significant pleural effusion. Costophrenic angle
blunting is due to hyperinflation and chronic. Prior granulomatous
disease with calcified mediastinal nodes.
IMPRESSION: 1. Worsening left perihilar airspace disease with increasing patchy
opacities and bronchial thickening, suspicious for worsening
pneumonia.
2. Increasing peribronchial thickening throughout the right lung.

## 2022-07-09 IMAGING — DX DG CHEST 1V PORT
1 series · 1 of 1 positions shown · non-contrast
Comparison: 06/03/2020

CLINICAL DATA: Respiratory failure

EXAM:
PORTABLE CHEST 1 VIEW

[chest]
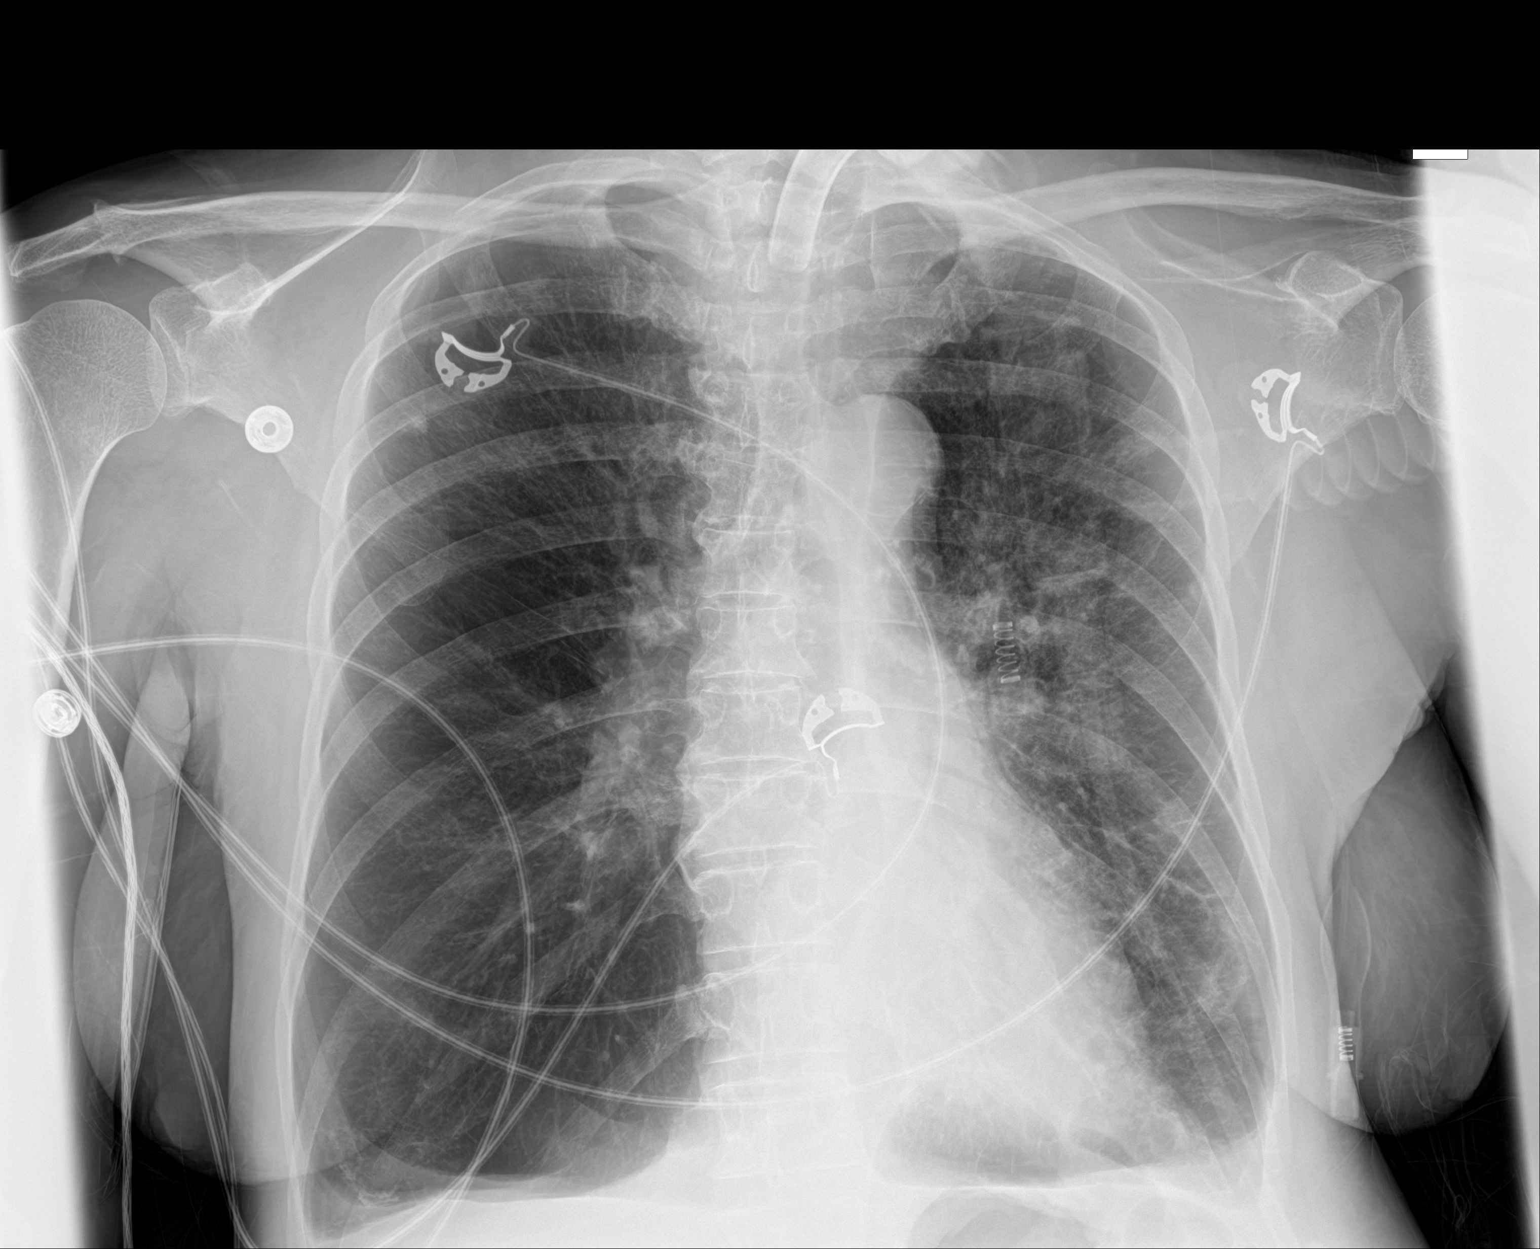

[1 of 1 positions shown; findings below may reference images not displayed]

FINDINGS: Tracheostomy cannula in appropriate position.

Lungs are hyperexpanded.

Mild strandy opacities at the left lung base favored to be
atelectasis. Pleural based right upper lobe lung nodule is again
seen.
IMPRESSION: Mild left basilar atelectasis. Interval improvement in aeration of
the perihilar opacities of the lungs. Right upper lobe pulmonary
nodule is again seen nonemergent noncontrast chest CT should be
performed to confirm stability of pulmonary nodules.

## 2022-07-11 IMAGING — DX DG CHEST 1V PORT
1 series · 1 of 1 positions shown · non-contrast
Comparison: 06/08/2020

CLINICAL DATA: Acute on chronic respiratory failure.

EXAM:
PORTABLE CHEST 1 VIEW

[chest ap]
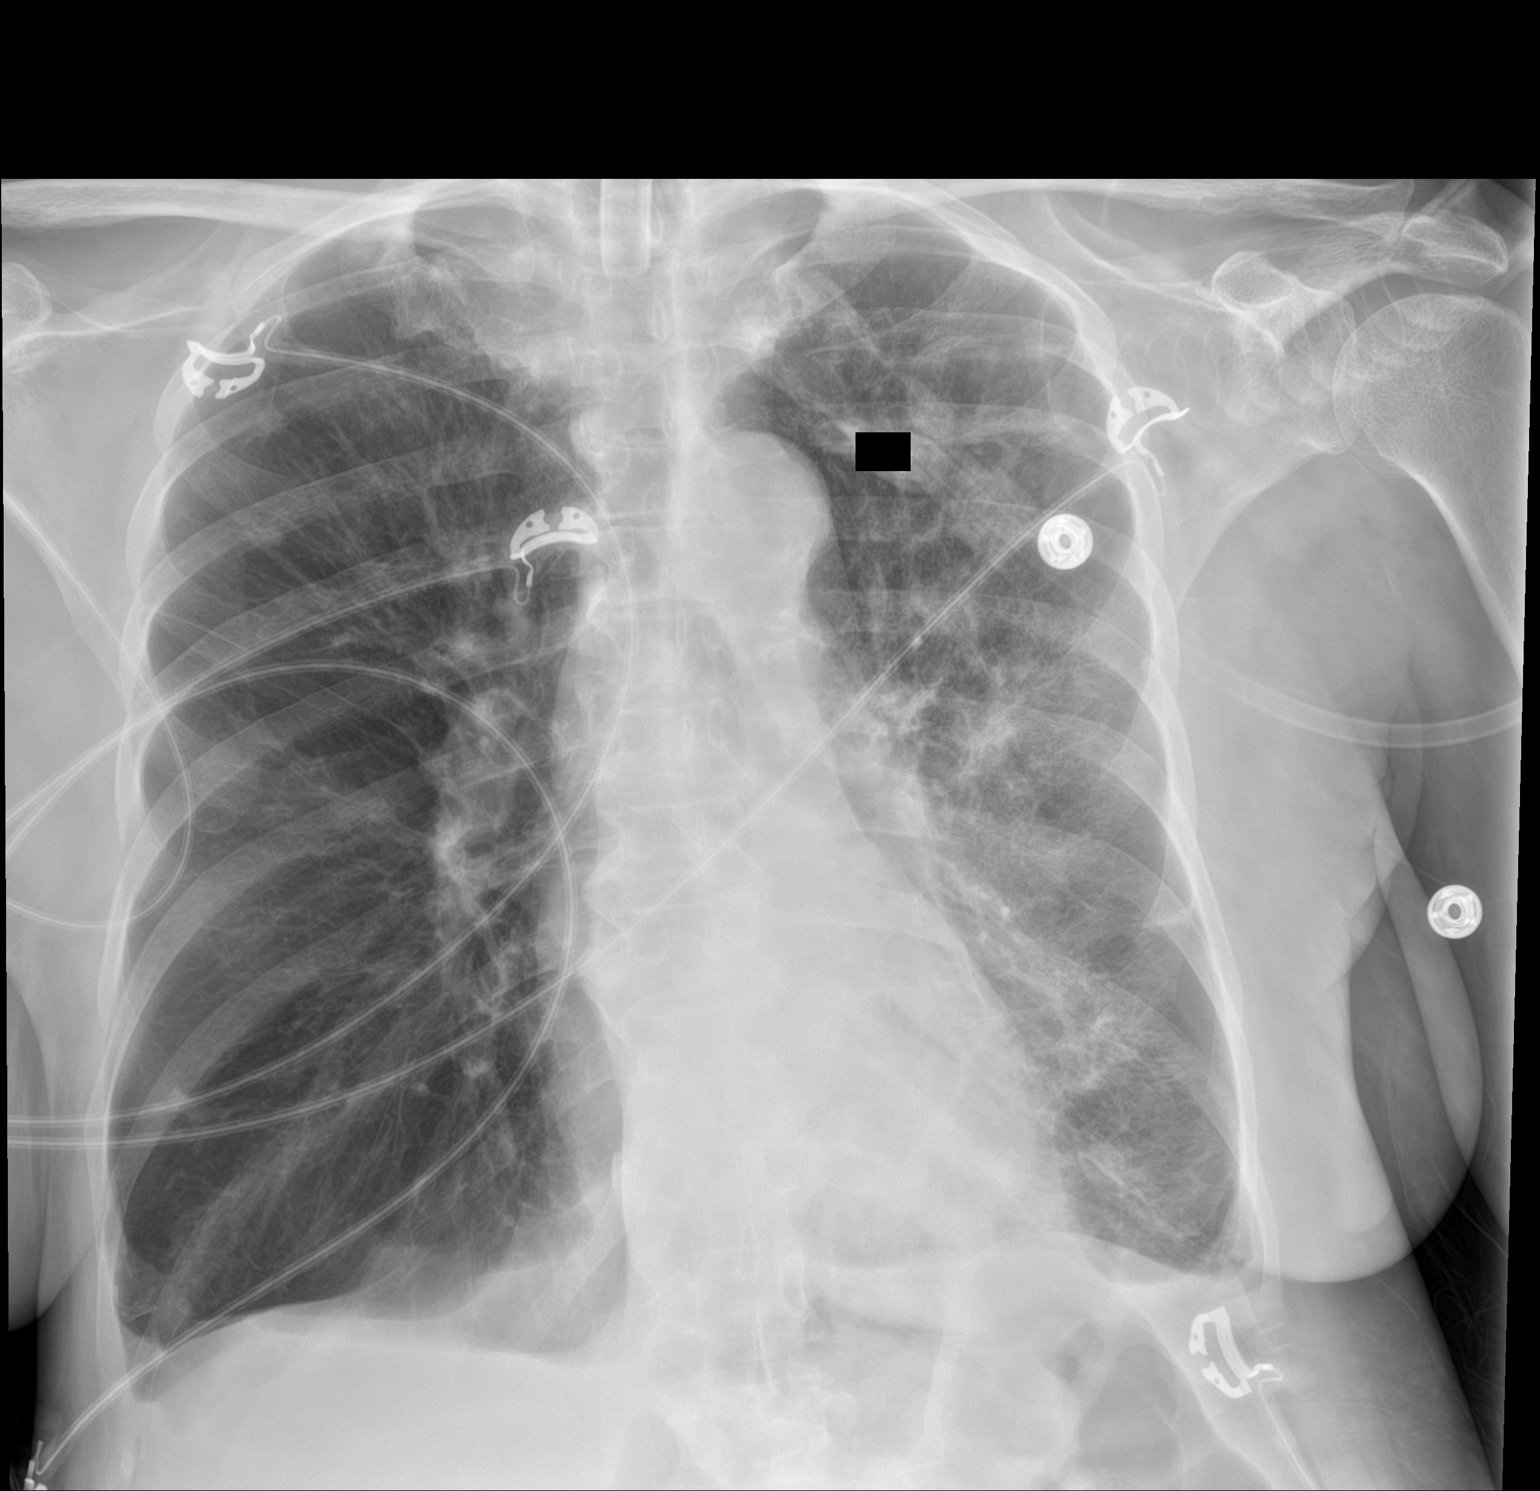

[1 of 1 positions shown; findings below may reference images not displayed]

FINDINGS: A tracheostomy tube is again noted.

Cardiomediastinal silhouette is unchanged.

Emphysema changes are again noted.

Interstitial opacities within the LEFT lung and LEFT basilar
atelectasis again noted.

There is no evidence of pneumothorax.
IMPRESSION: Little significant change from the prior study with LEFT lung
interstitial opacities and LEFT basilar atelectasis.

## 2022-07-15 IMAGING — DX DG CHEST 1V PORT
1 series · 1 of 1 positions shown · non-contrast
Comparison: 06/10/2020

CLINICAL DATA: Chest pain

EXAM:
PORTABLE CHEST 1 VIEW

[chest]
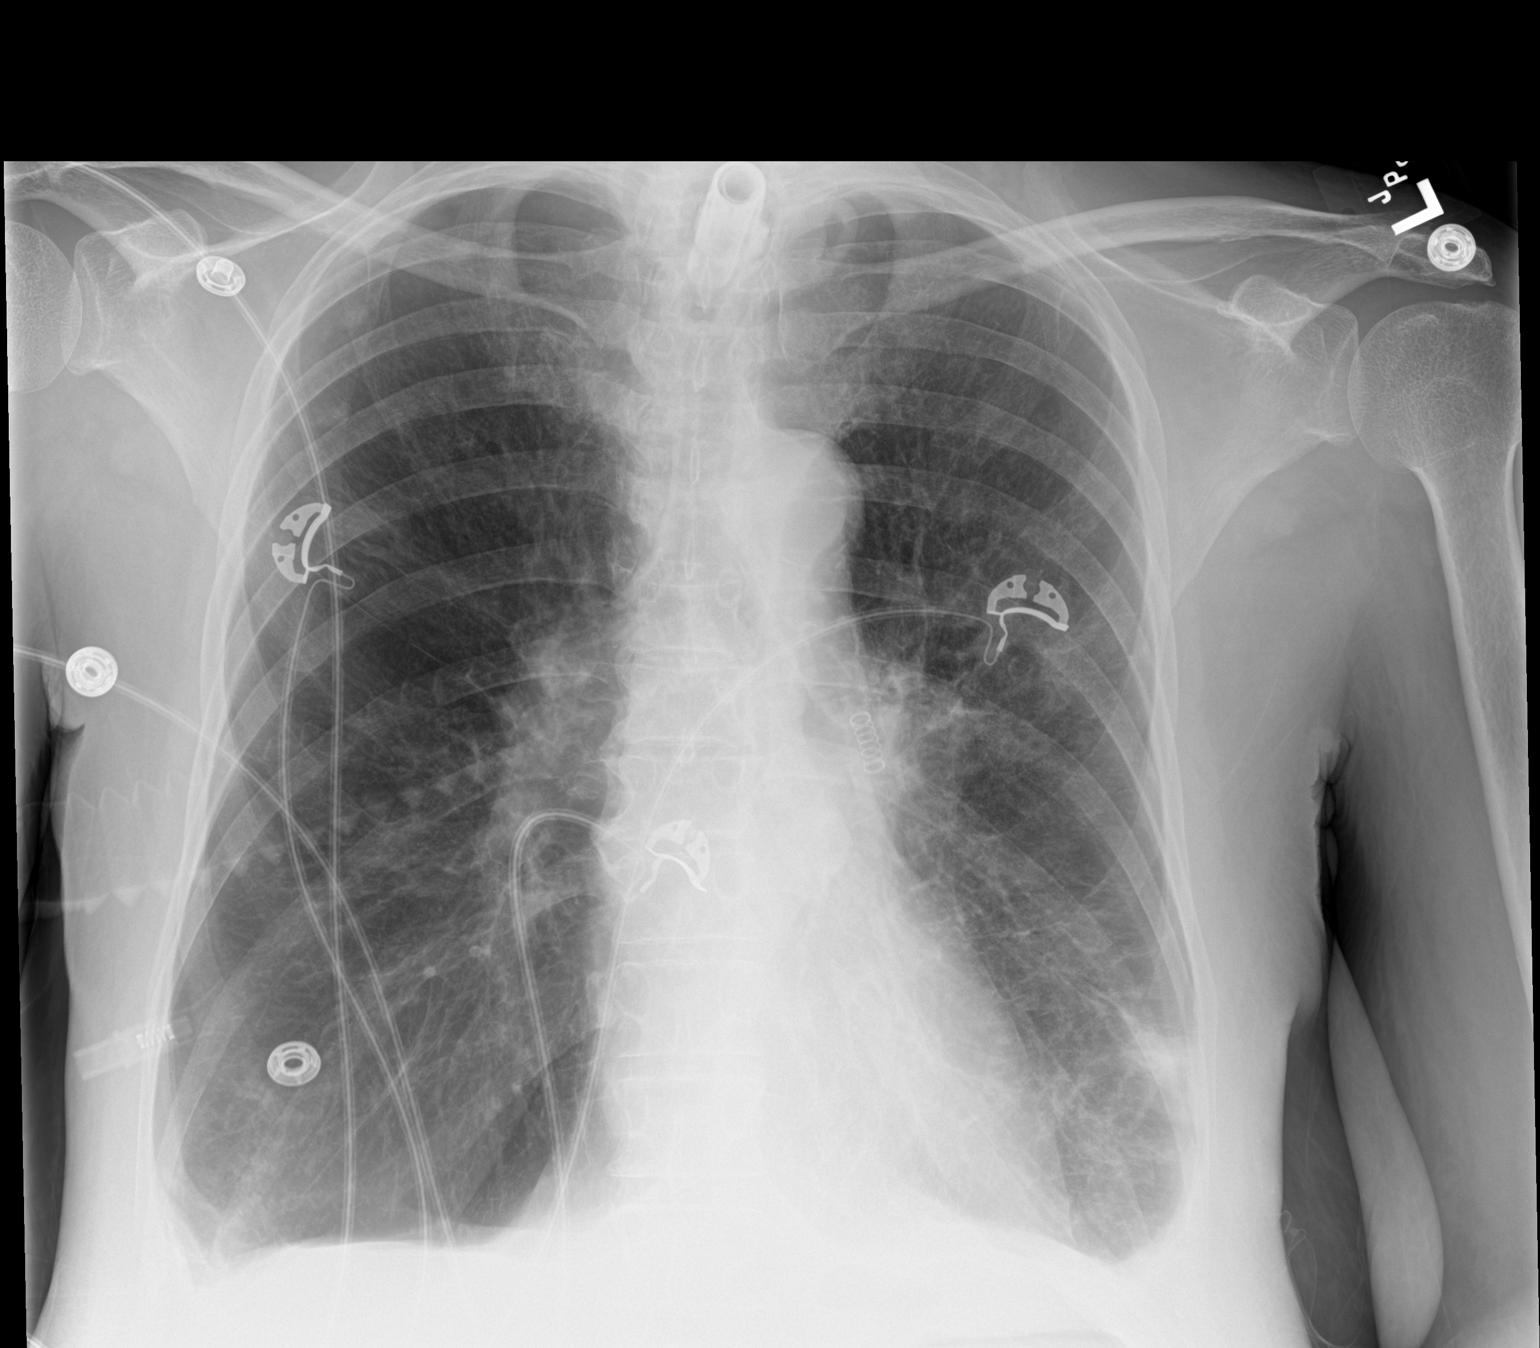

[1 of 1 positions shown; findings below may reference images not displayed]

FINDINGS: Blunting of the costophrenic angles likely reflecting persistent
trace pleural effusions. Improved left lung aeration with persistent
patchy density primarily at the lung base. No pneumothorax. Stable
cardiomediastinal contours. Tracheostomy device is present.
IMPRESSION: Improved left lung aeration with persistent patchy
atelectasis/consolidation at the base. Similar trace pleural
effusions.

## 2022-07-19 IMAGING — DX DG ANKLE COMPLETE 3+V*R*
1 series · 3 of 3 positions shown · non-contrast
Comparison: None.

CLINICAL DATA: Pain and tenderness.

EXAM:
RIGHT ANKLE - COMPLETE 3+ VIEW

[Series 1: ankle · 0.14mm/px · 3 of 3 slices shown]
[im 1/3]
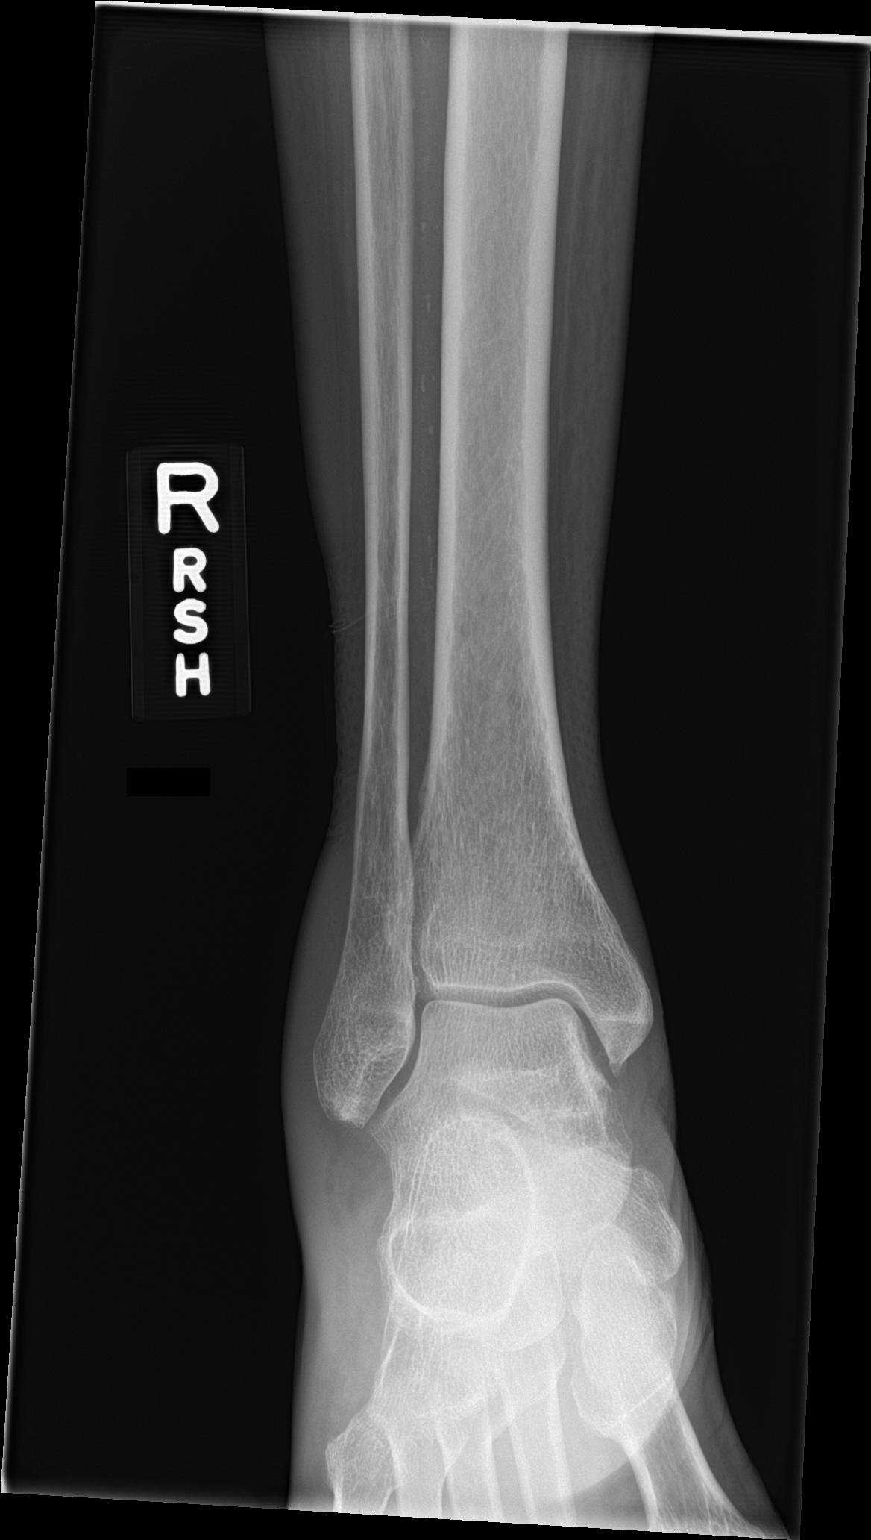
[im 2/3]
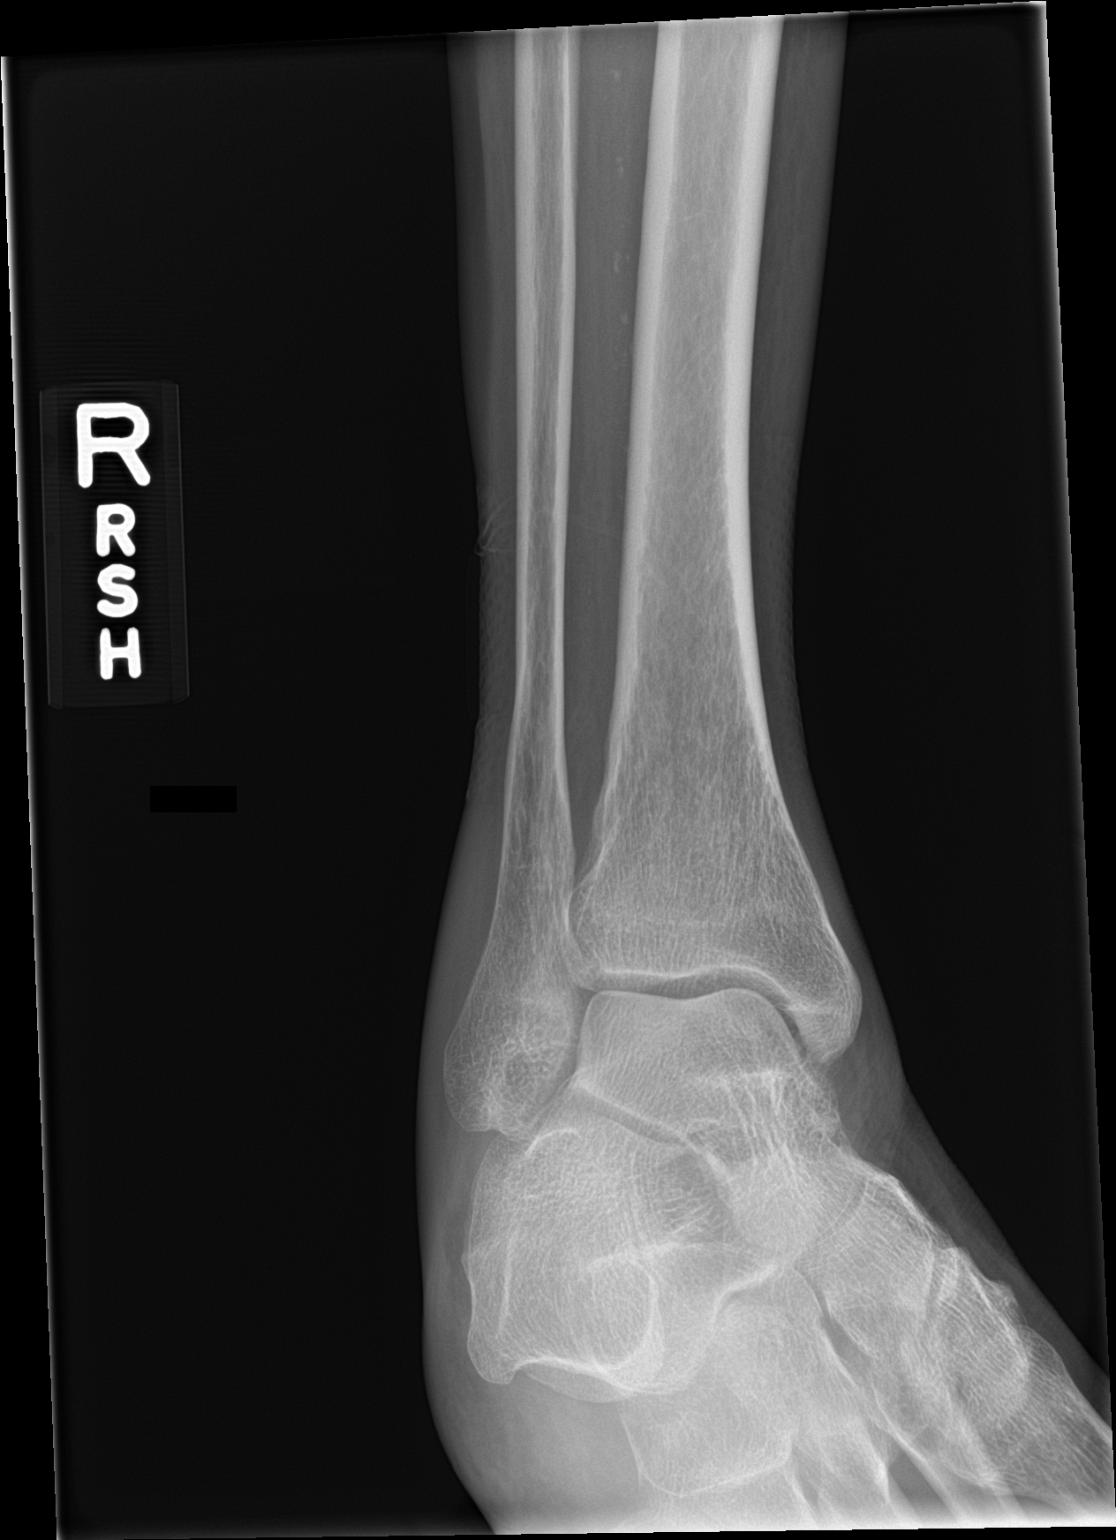
[im 3/3]
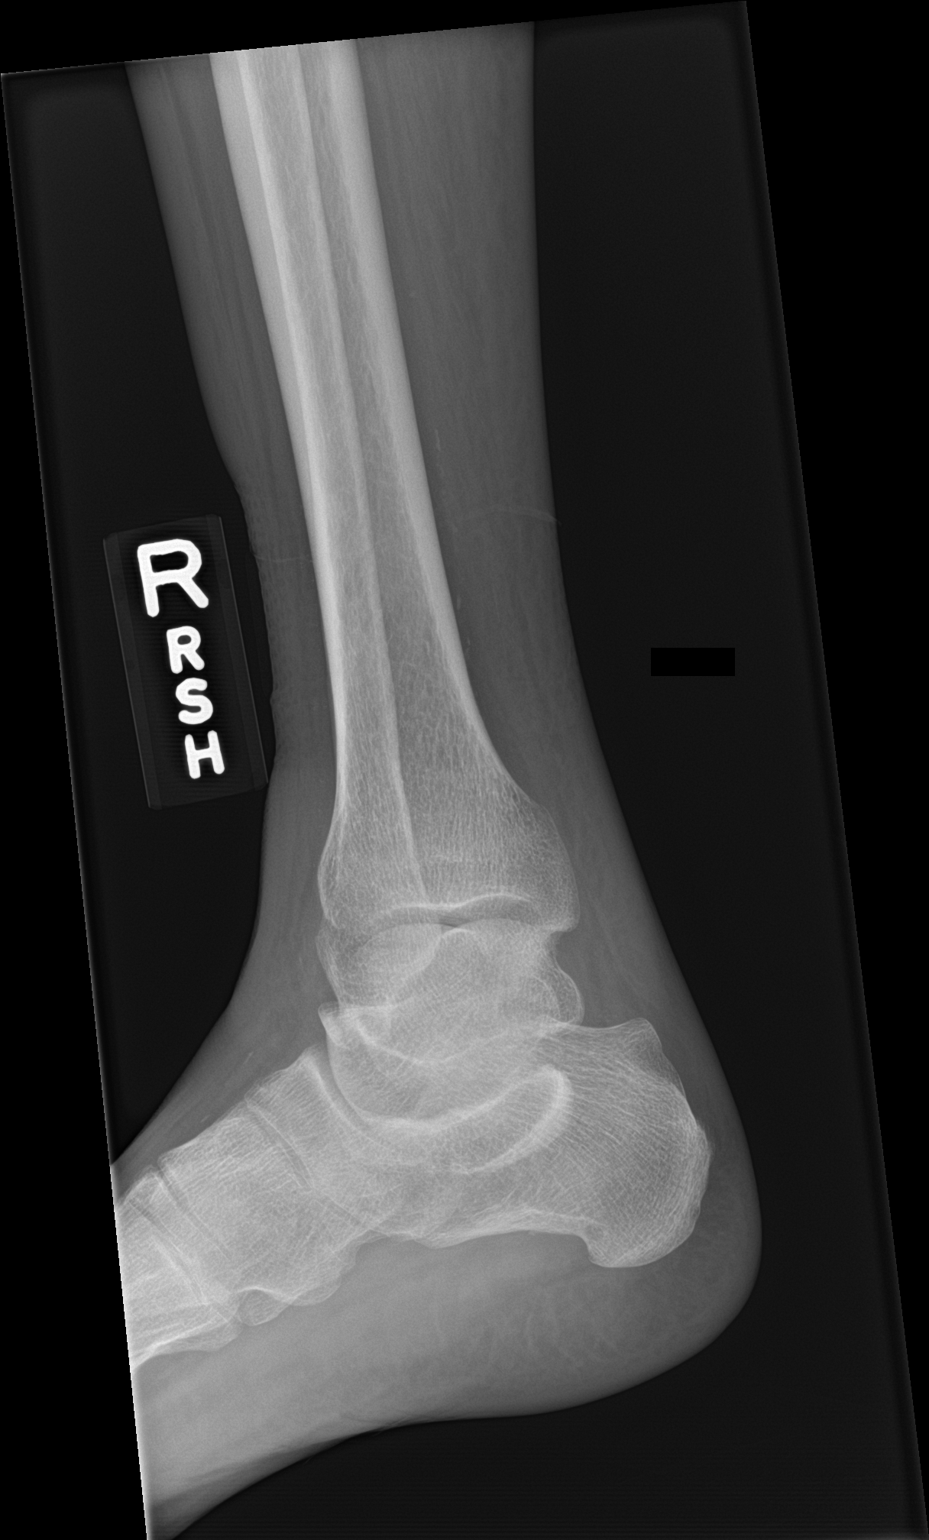

[3 of 3 positions shown; findings below may reference images not displayed]

FINDINGS: Portable exam limited by oblique positioning on the lateral
projection. Within this limitation there is no gross fracture or
evidence of dislocation. Soft tissue swelling evident. No worrisome
lytic or sclerotic osseous abnormality.
IMPRESSION: Limited study due to positioning. Within this limitation, no gross
fracture or dislocation.

## 2022-07-19 IMAGING — DX DG FOOT COMPLETE 3+V*R*
2 series · 3 of 3 positions shown · non-contrast
Comparison: None.

CLINICAL DATA: Pain and tenderness.

EXAM:
RIGHT FOOT COMPLETE - 3+ VIEW

[Series 1: foot · 0.14mm/px · 2 of 2 slices shown]
[im 1/2]
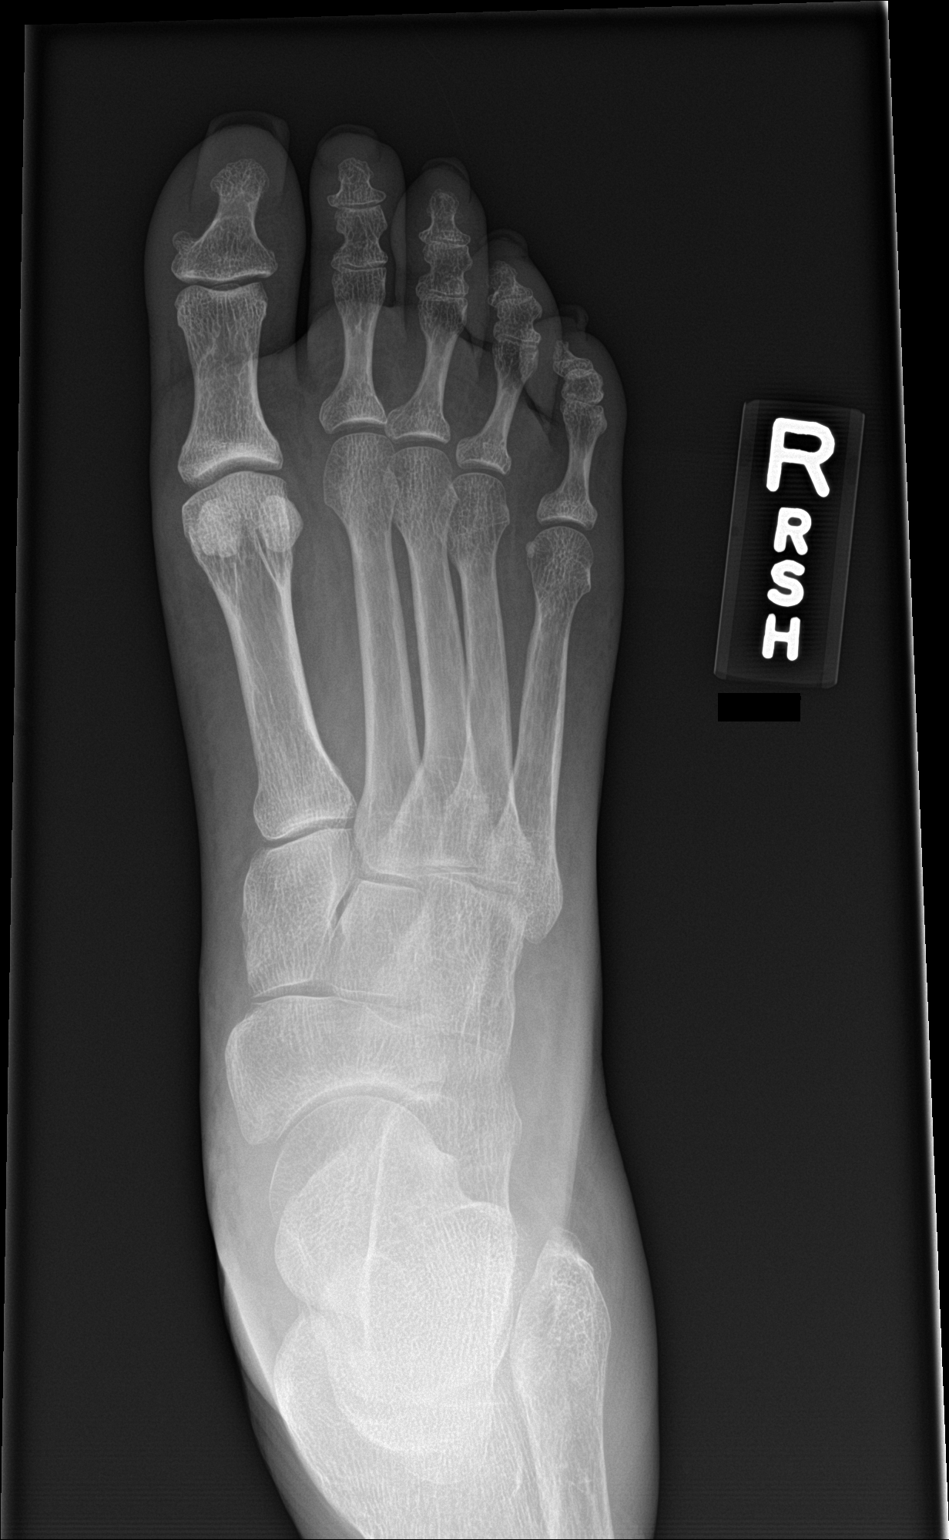
[im 2/2]
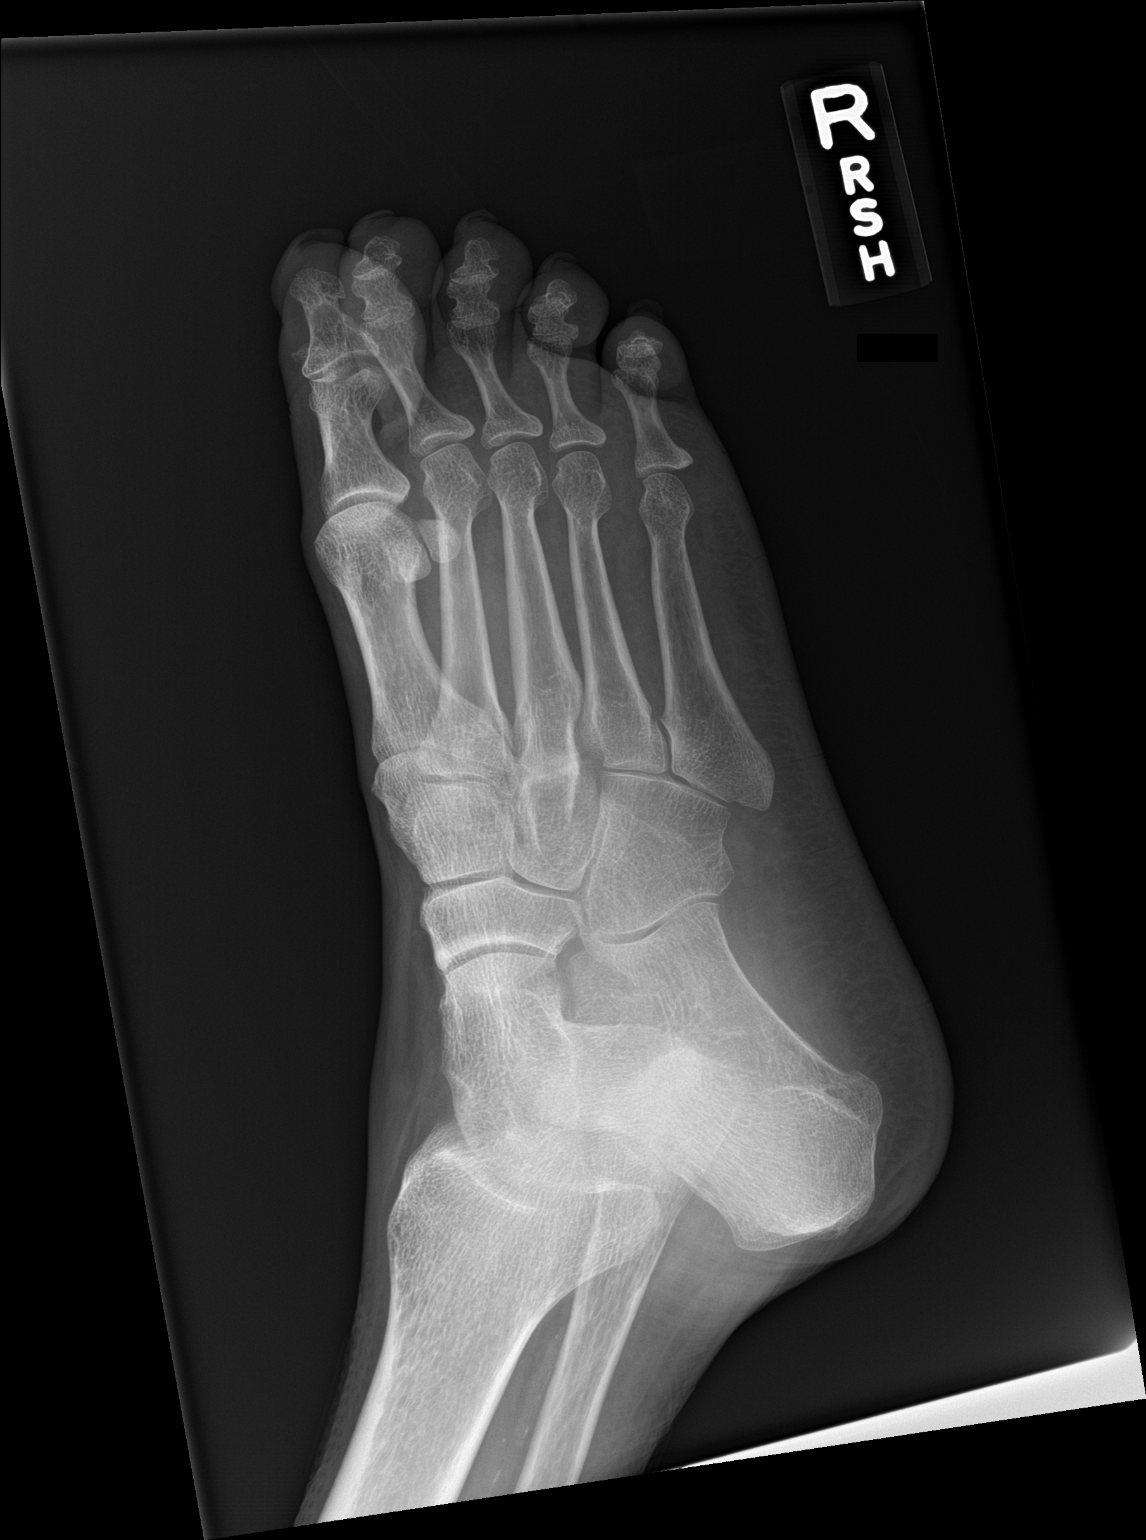

[leg]
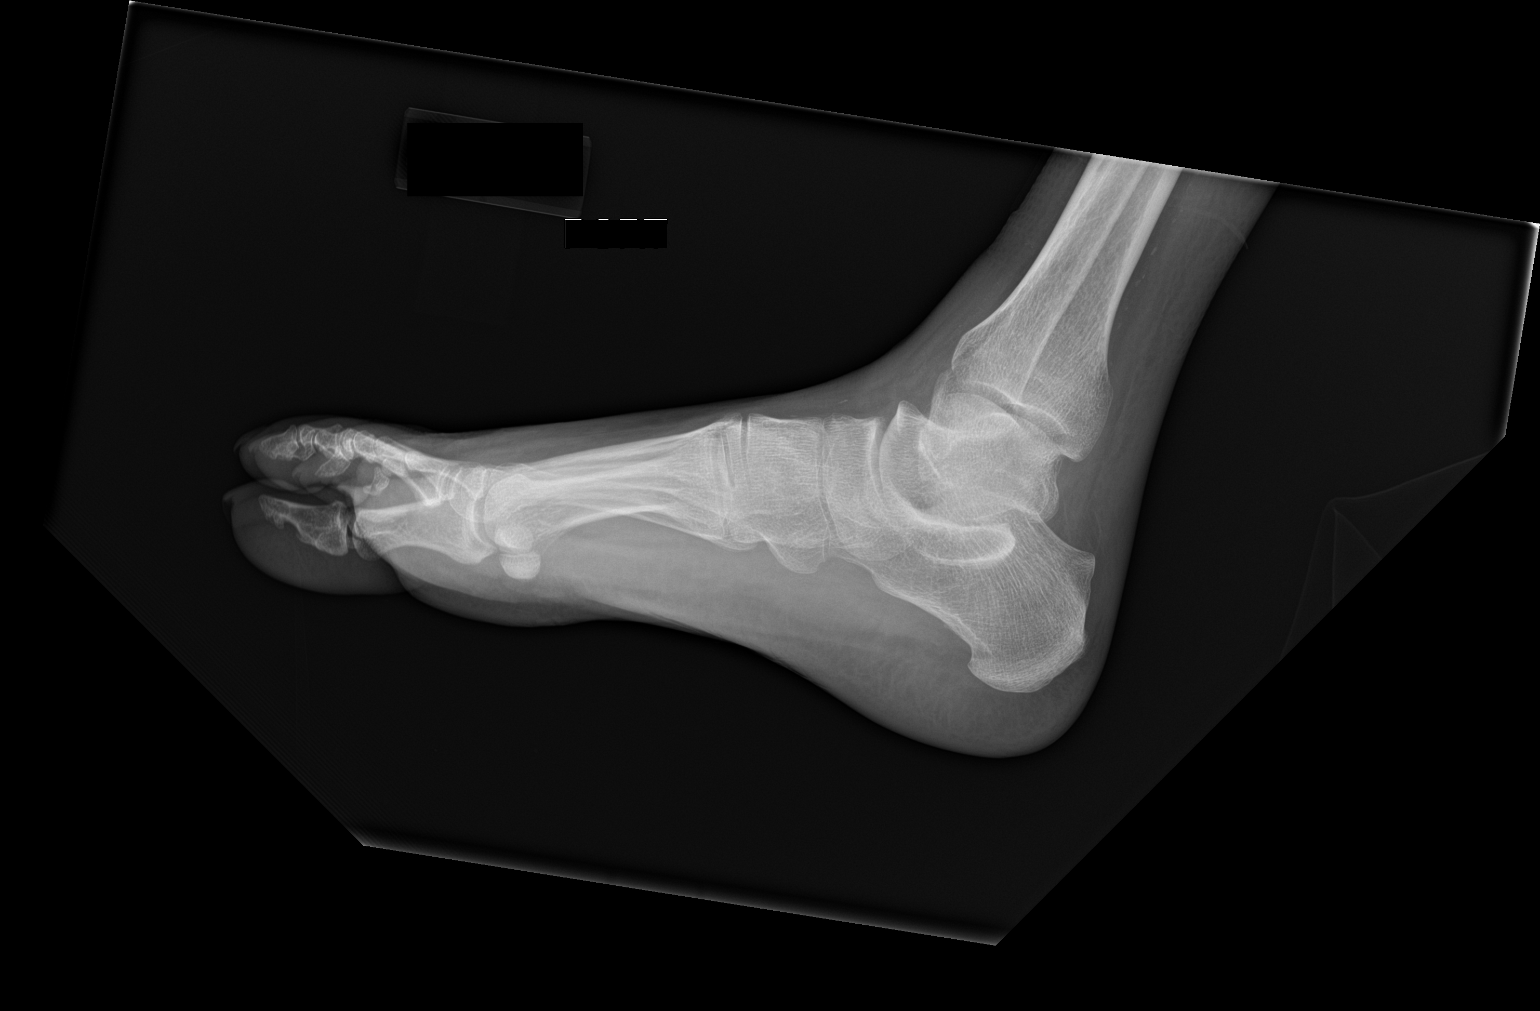

[3 of 3 positions shown; findings below may reference images not displayed]

FINDINGS: There is no evidence of fracture or dislocation. There is no
evidence of arthropathy or other focal bone abnormality. Soft
tissues are unremarkable.
IMPRESSION: Negative.

## 2022-07-24 IMAGING — DX DG CHEST 1V PORT
2 series · 2 of 2 positions shown · non-contrast
Comparison: March 13, 2020 in June 15, 2019

CLINICAL DATA: Respiratory failure

EXAM:
PORTABLE CHEST 1 VIEW

[chest ap (1 of 2)]
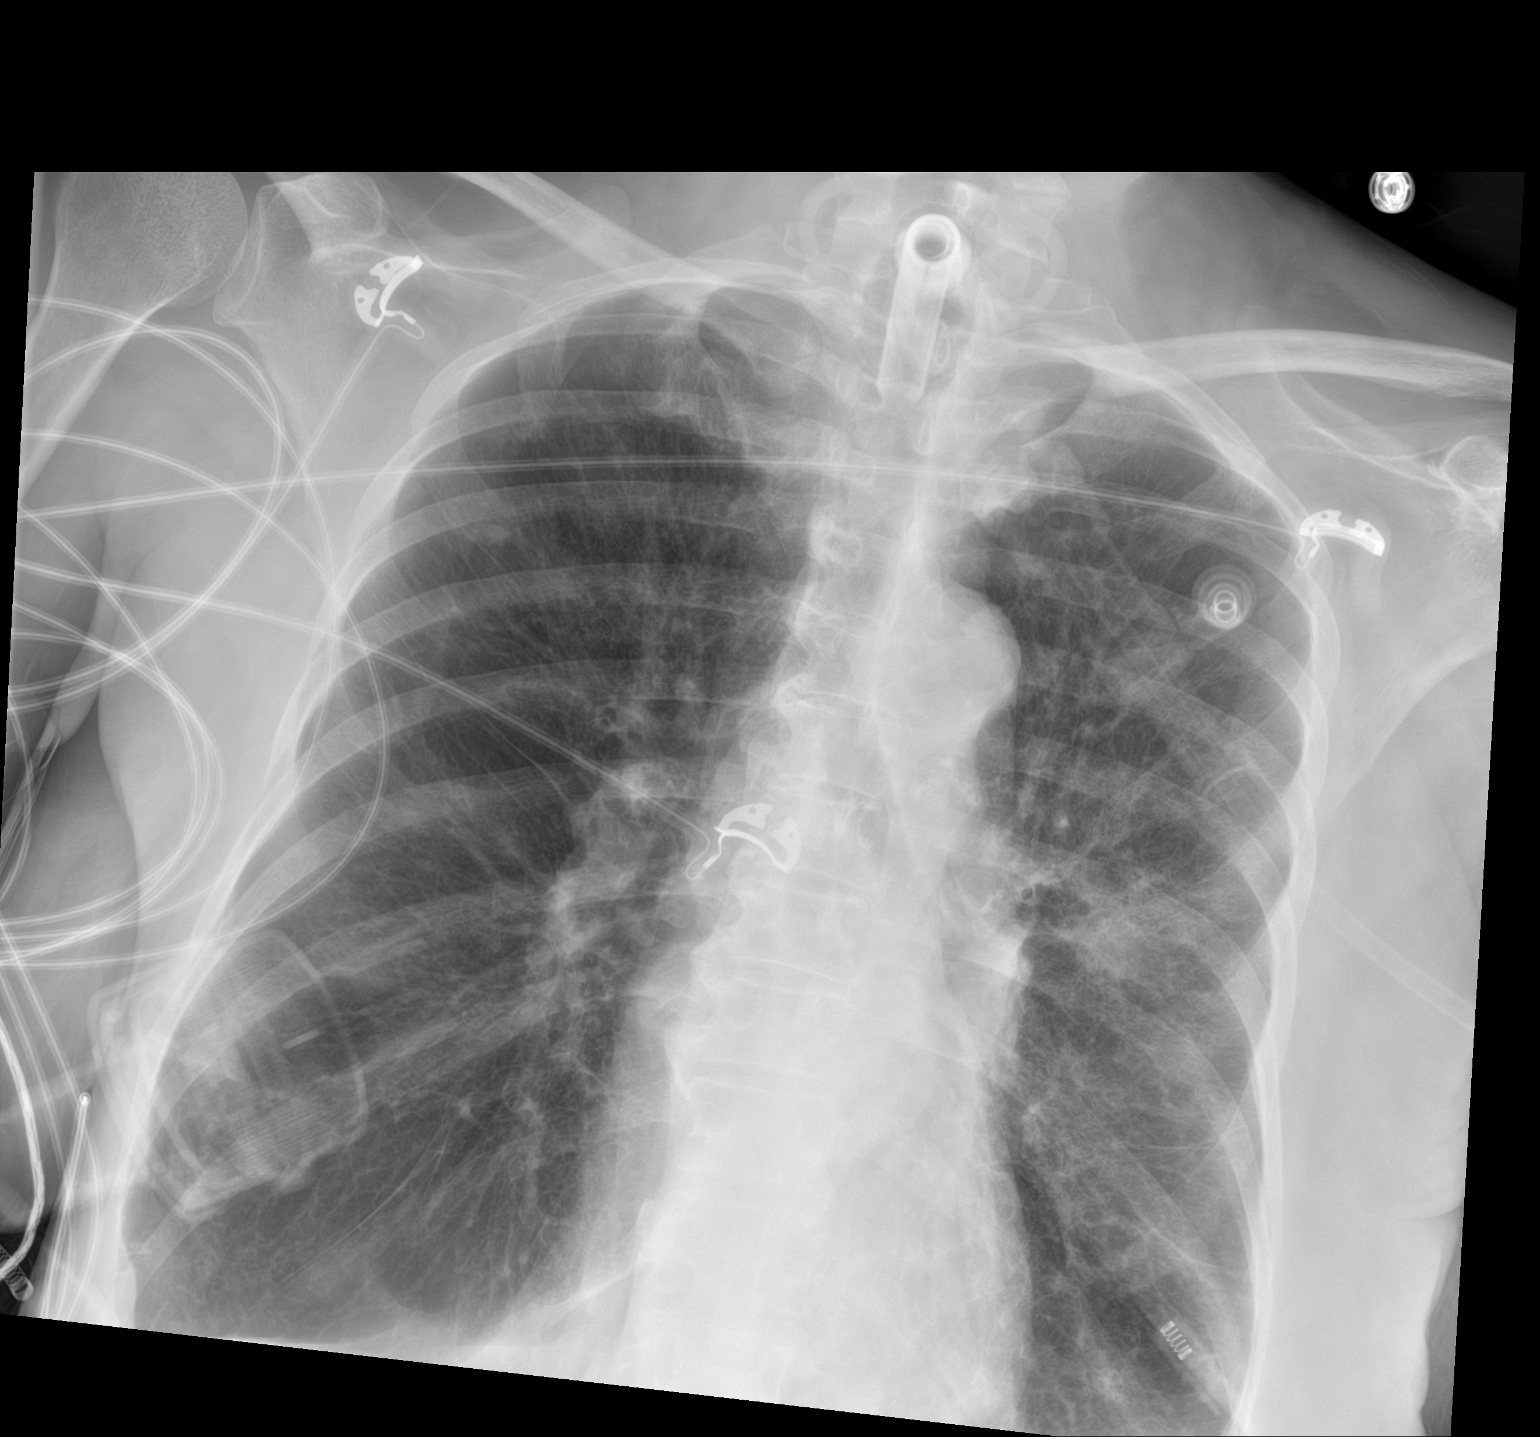

[chest ap (2 of 2)]
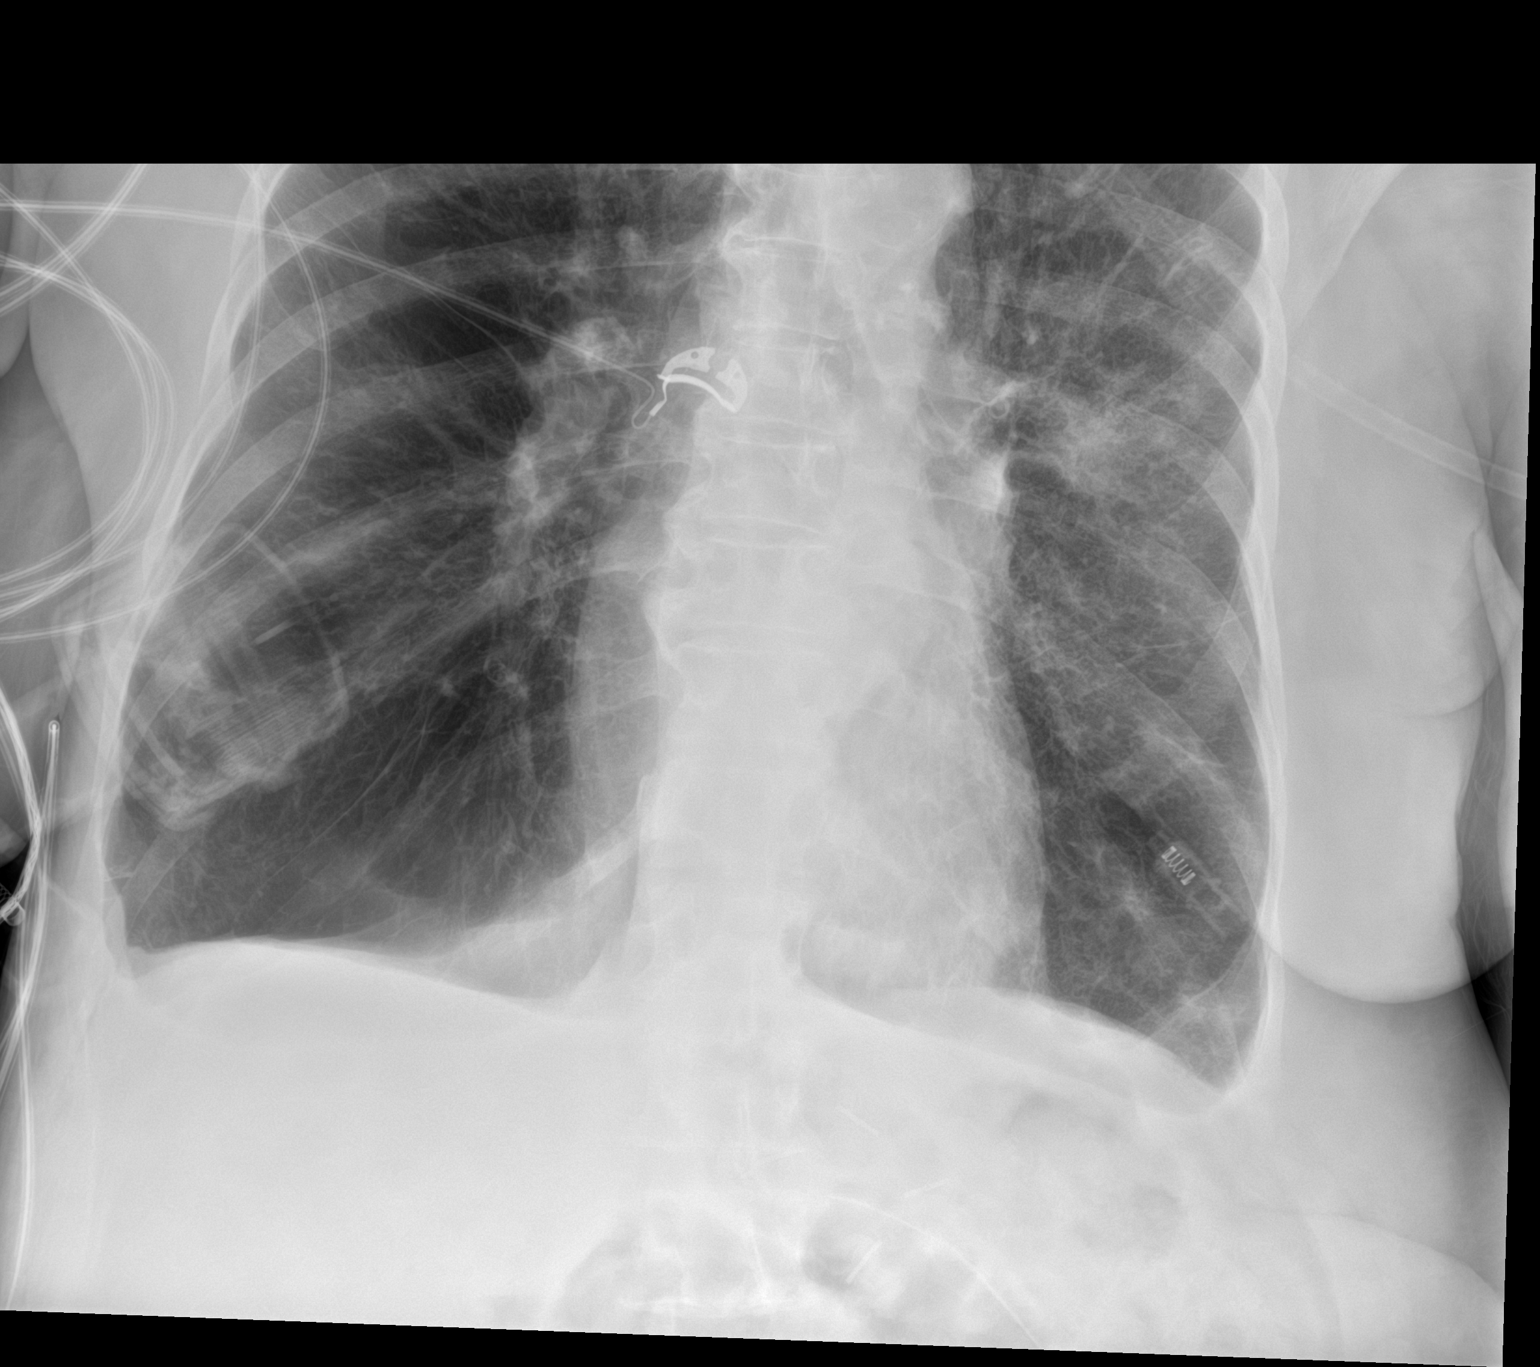

[2 of 2 positions shown; findings below may reference images not displayed]

FINDINGS: The tracheostomy tube is in good position. No pneumothorax.
Nodularity in the periphery of the right upper lobe is stable.
Hyperinflation of the lungs. The cardiomediastinal silhouette is
stable. Increased interstitial markings on the right. More
pronounced opacity in the left lung diffusely, most prominent in the
left perihilar region. There is a focal opacity projected over the
left hilum. No other acute abnormalities.
IMPRESSION: 1. Tracheostomy tube is above.
2. New focal opacity in the left hilar region, suggestive of
developing infiltrate/pneumonia. More diffuse opacity throughout the
left mid lung may represent part of this infectious process.
Recommend attention on follow-up.
3. Mild diffuse increased interstitial opacities on the right may
represent asymmetric edema versus atypical infection.
4. No other changes.

## 2022-07-28 IMAGING — DX DG CHEST 1V PORT
1 series · 1 of 1 positions shown · non-contrast
Comparison: 06/23/20

CLINICAL DATA: Shortness of breath

EXAM:
PORTABLE CHEST 1 VIEW

[chest ap]
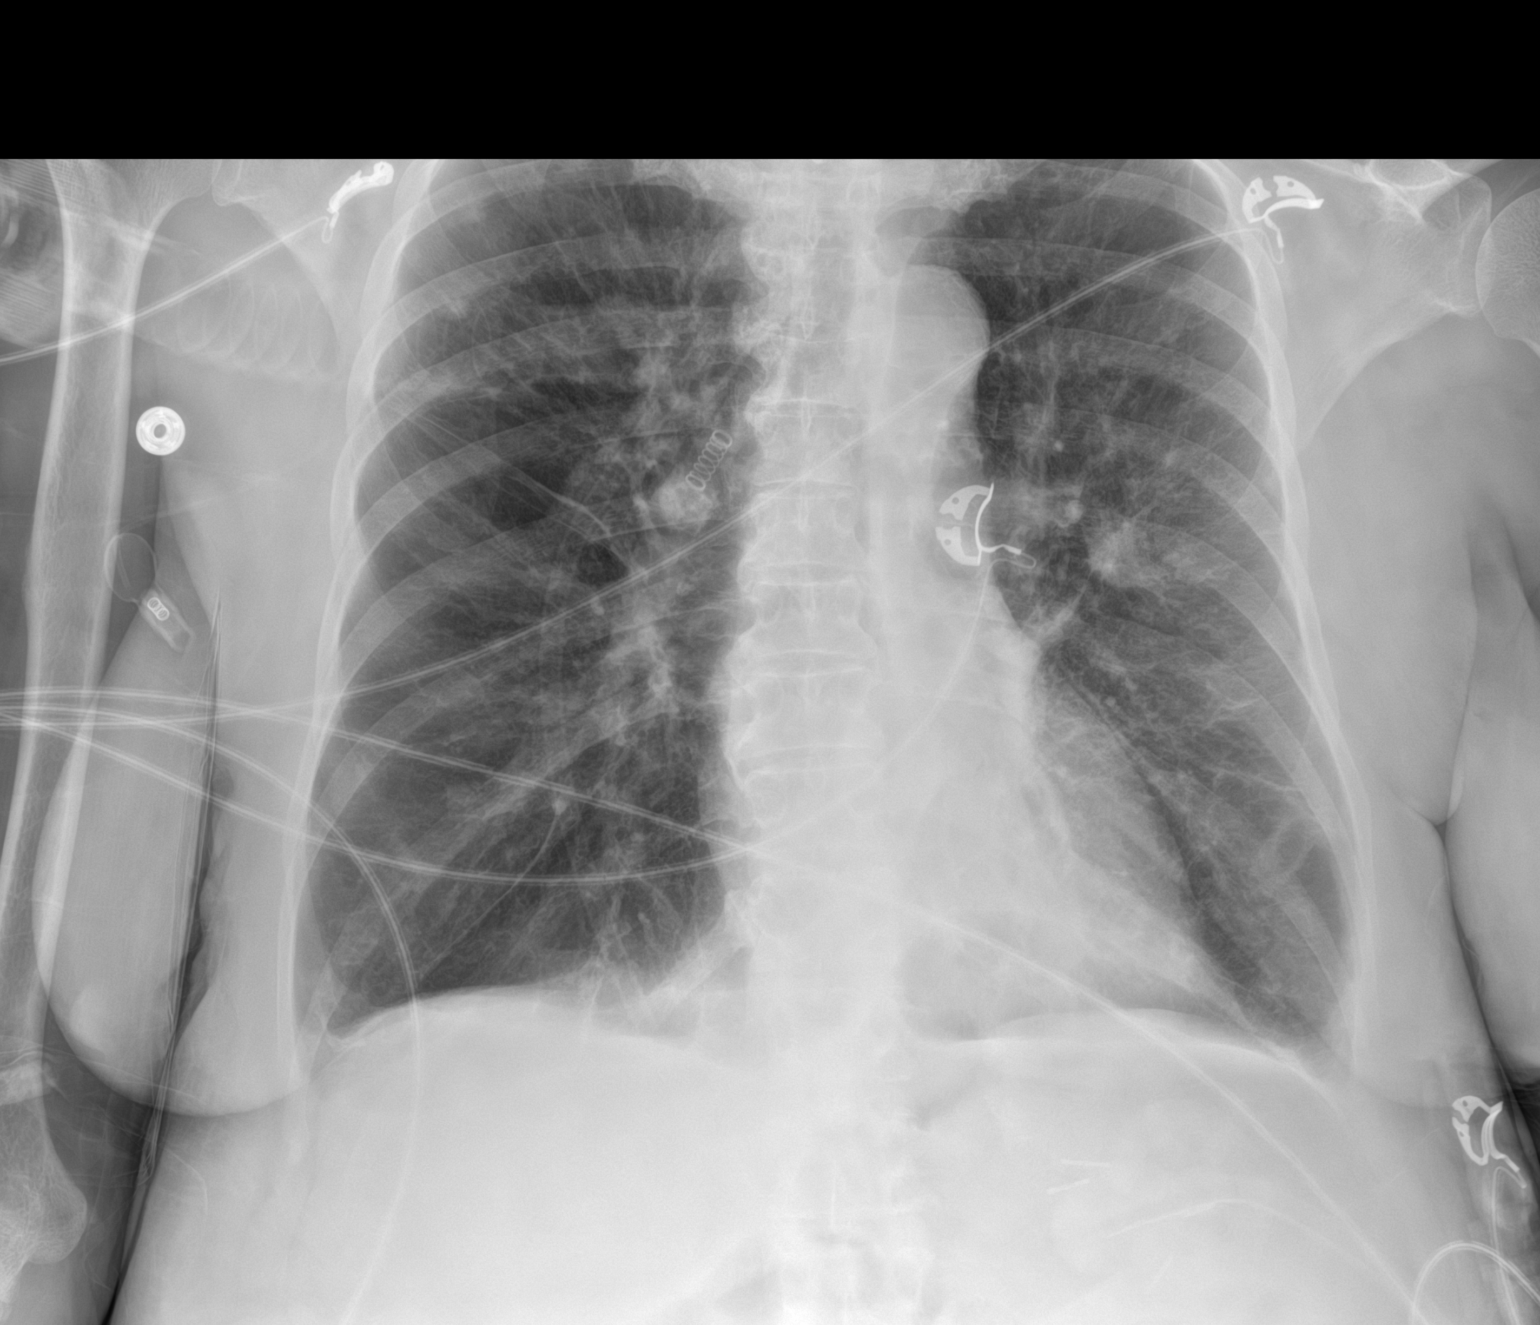

[1 of 1 positions shown; findings below may reference images not displayed]

FINDINGS: Cardiac shadow is stable. The lungs are hyperinflated stable from
the prior exam. Patchy airspace opacity is noted in the left mid
lung slightly improved from the prior study. Mild interstitial
changes are again identified and stable. No new focal abnormality is
seen.
IMPRESSION: Slight improvement in focal airspace opacity in the left mid lung.

## 2022-08-03 IMAGING — DX DG CHEST 1V PORT
1 series · 1 of 1 positions shown · non-contrast
Comparison: June 27, 2020

CLINICAL DATA: Shortness of breath.

EXAM:
PORTABLE CHEST 1 VIEW

[chest ap]
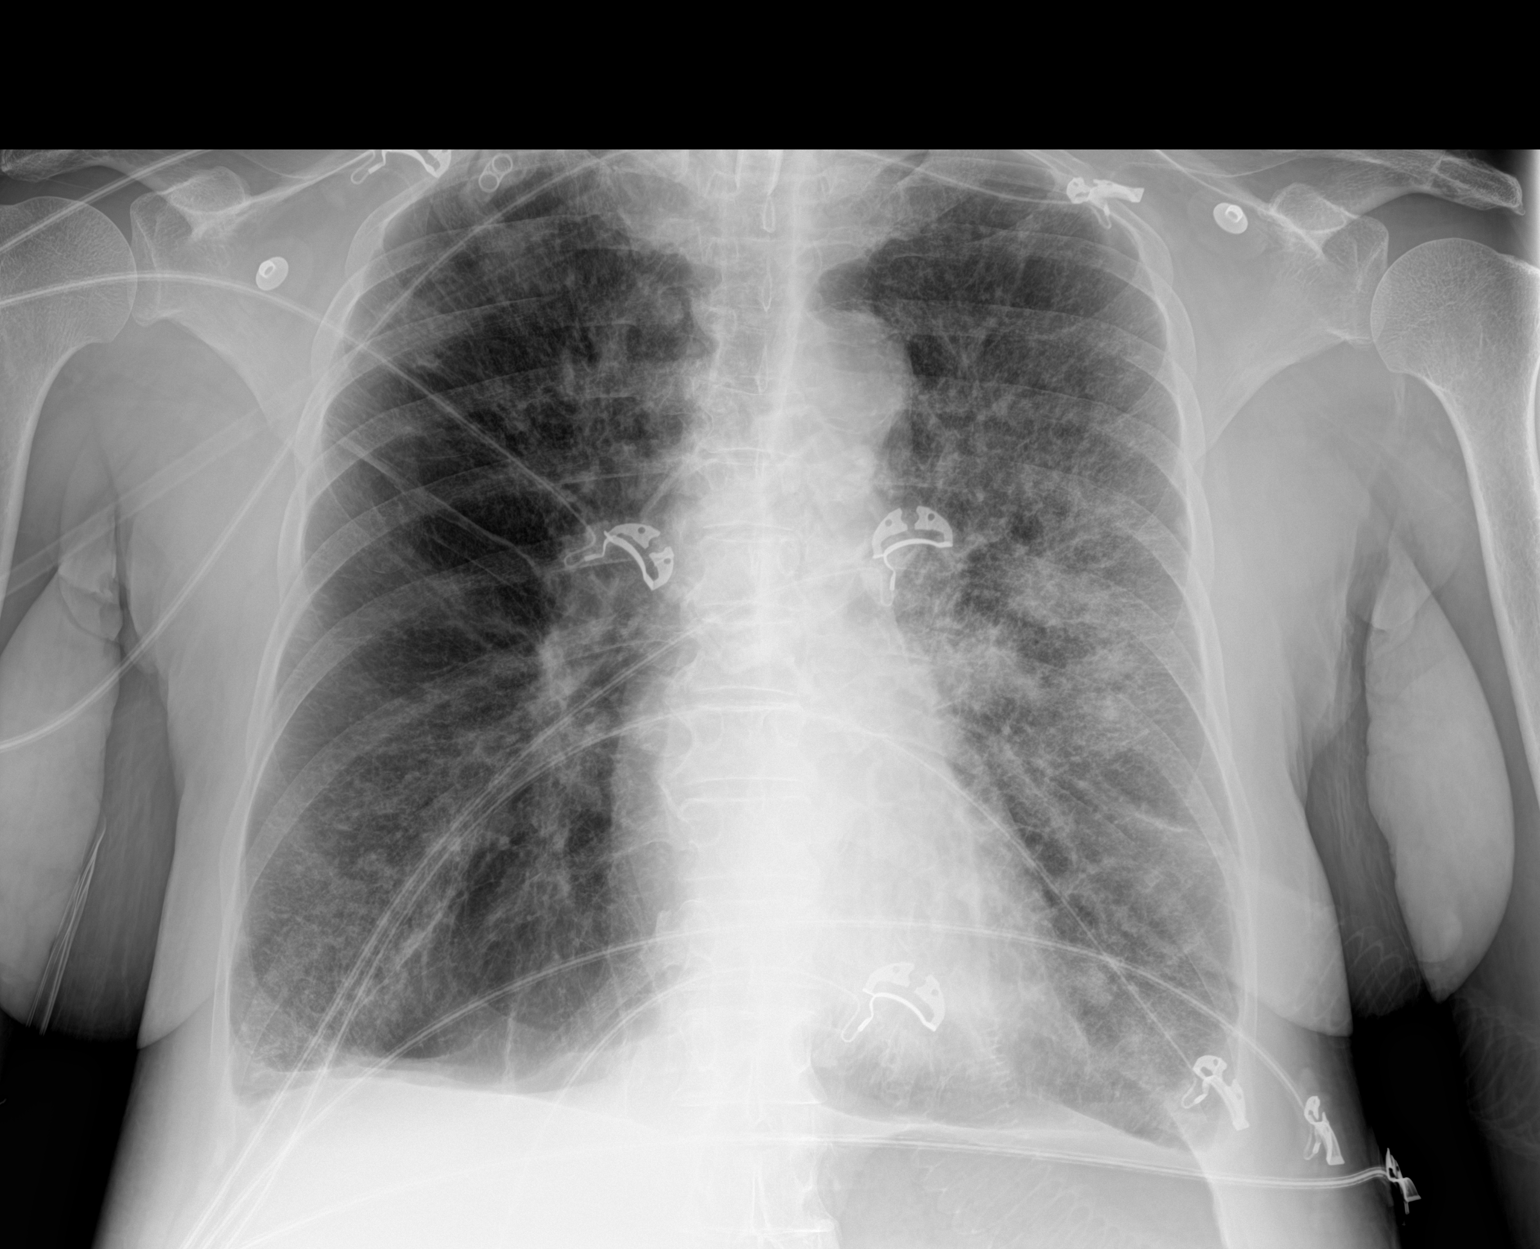

[1 of 1 positions shown; findings below may reference images not displayed]

FINDINGS: Tracheostomy catheter tip is 7.5 cm above the carina. No
pneumothorax. There is ill-defined airspace opacity in the left mid
lung and in both lung bases. There appears to be underlying
emphysematous change in fibrosis. Heart size and pulmonary
vascularity normal. No adenopathy. No bone lesions.
IMPRESSION: Tracheostomy as described without pneumothorax. Ill-defined airspace
opacity left mid lung and base regions consistent with multifocal
pneumonia. Underlying scarring and emphysematous change. Appearance
raises concern for atypical organism pneumonia. Check of [id number]
status advised.

Heart size normal.  No adenopathy appreciable.

## 2022-08-03 IMAGING — CT CT ABD-PELV W/O CM
2 of 4 series · 16 of 46 positions shown, 18 images · non-contrast
Comparison: 02/28/2020

CLINICAL DATA: Acute nonlocalized abdominal pain

EXAM:
CT ABDOMEN AND PELVIS WITHOUT CONTRAST
TECHNIQUE: Multidetector CT imaging of the abdomen and pelvis was performed
following the standard protocol without IV contrast.

[Series 3: ap without · axial · non-contrast · 0.78mm/px · z∈[-838,-504]mm · 13 of 77 slices shown, 15 images]
[im 5/77  soft-tissue]
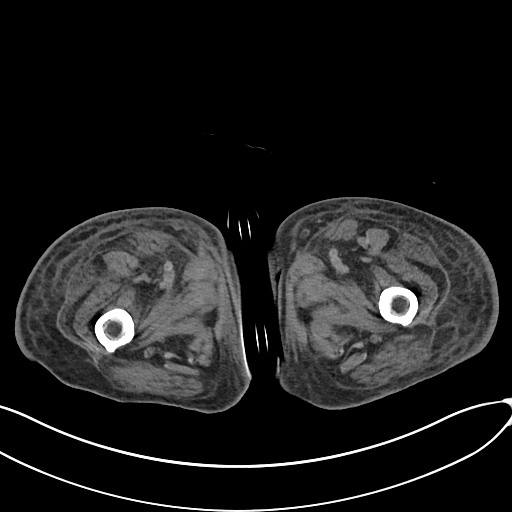
[im 5/77  bone]
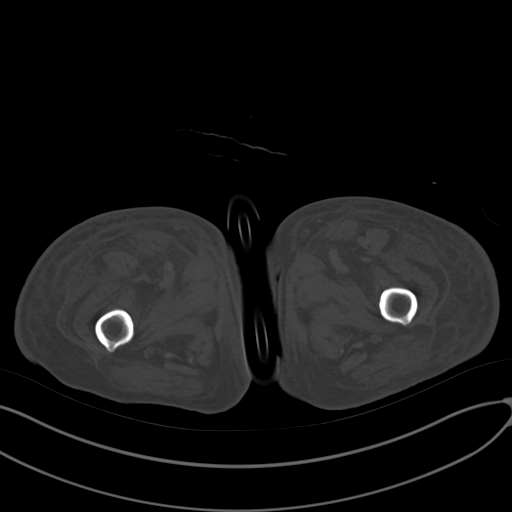
[im 9/77  soft-tissue]
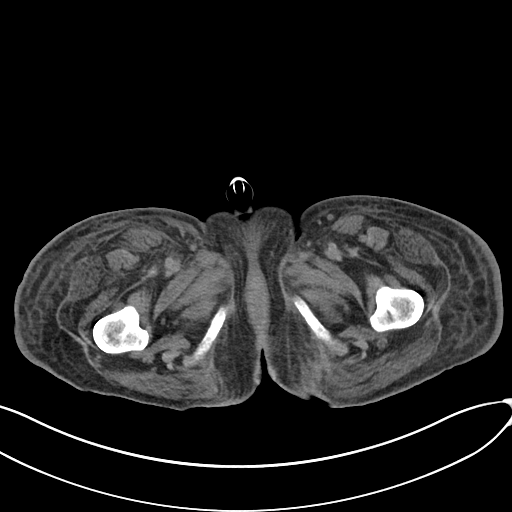
[im 18/77  soft-tissue]
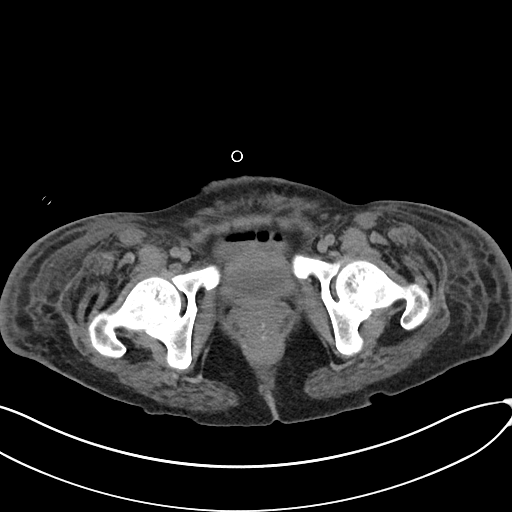
[im 23/77  soft-tissue]
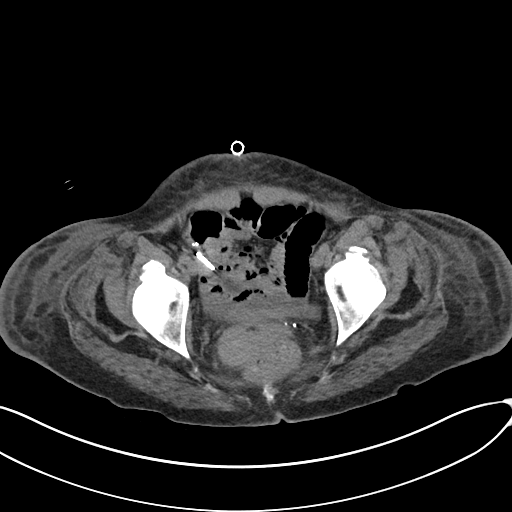
[im 27/77  soft-tissue]
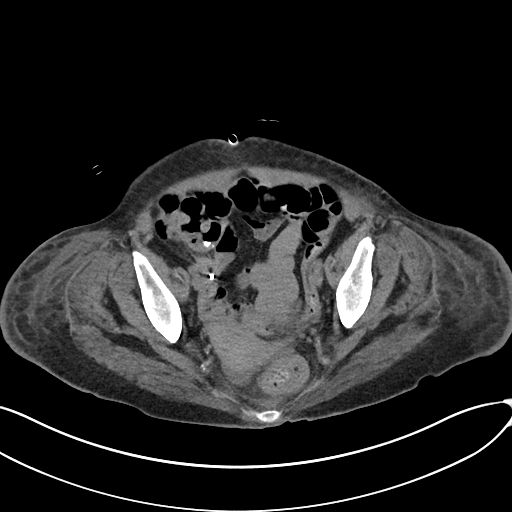
[im 32/77  soft-tissue]
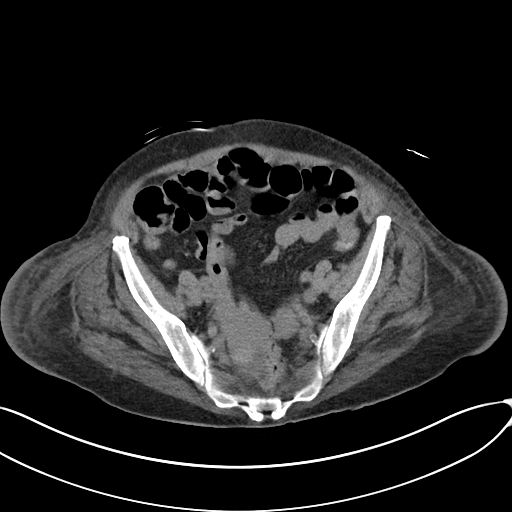
[im 41/77  soft-tissue]
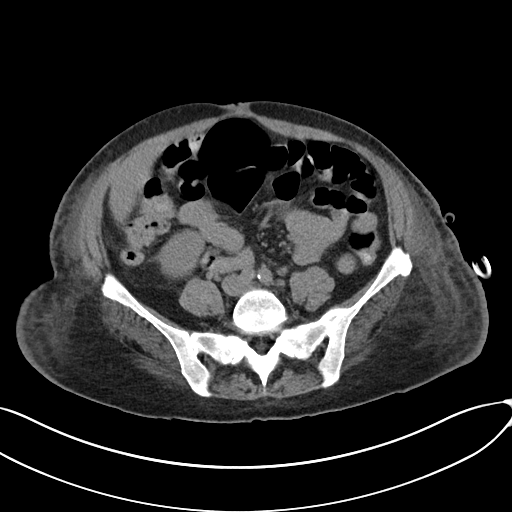
[im 45/77  soft-tissue]
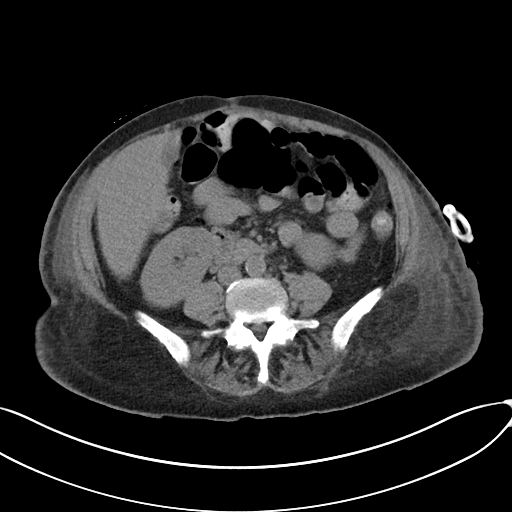
[im 50/77  soft-tissue]
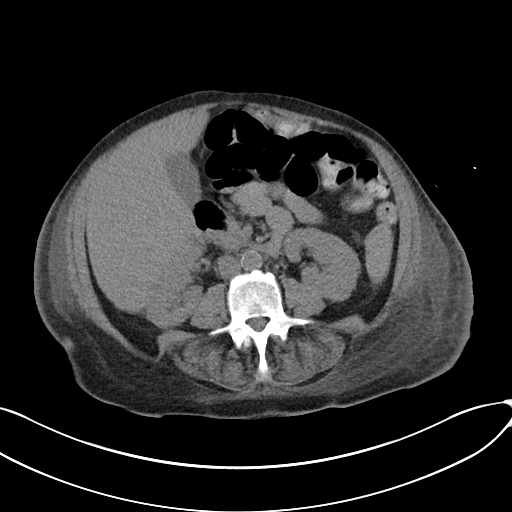
[im 50/77  bone]
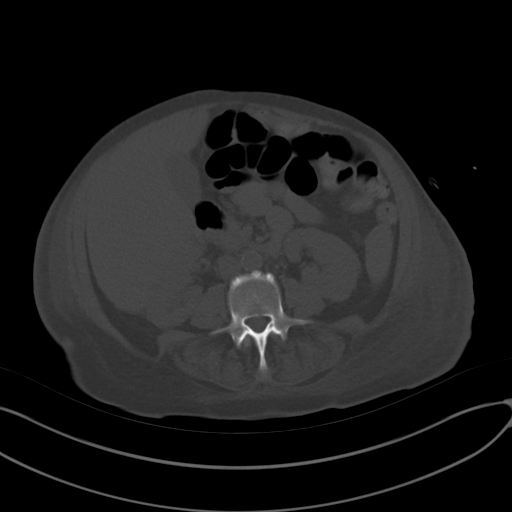
[im 54/77  soft-tissue]
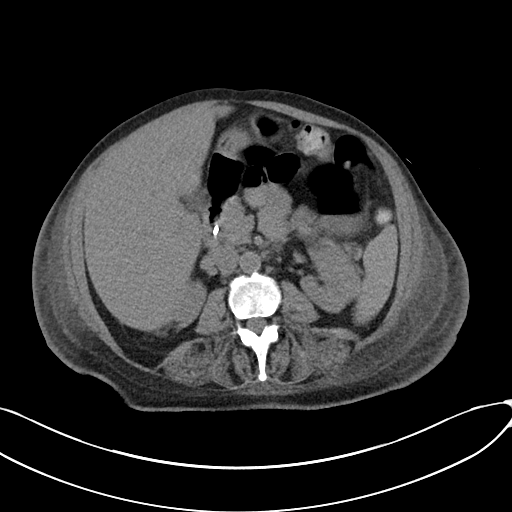
[im 59/77  soft-tissue]
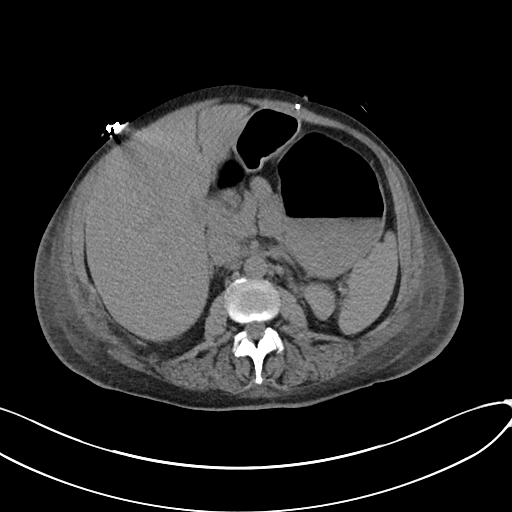
[im 68/77  soft-tissue]
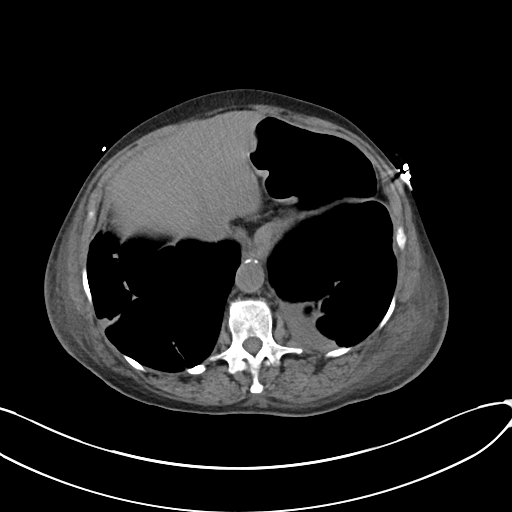
[im 72/77  soft-tissue]
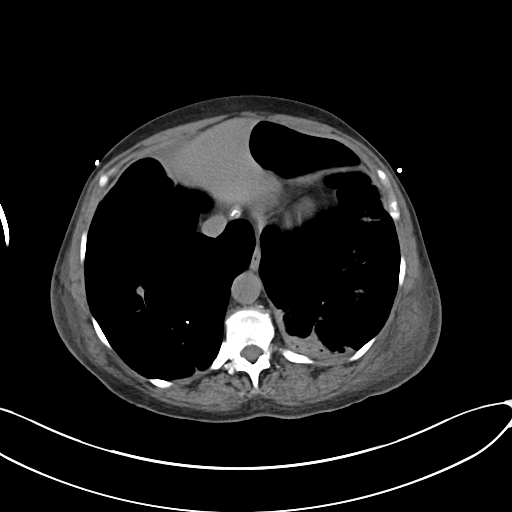

[Series 6: cor · coronal · 0.75mm/px · 3 of 94 slices shown]
[im 32/94  soft-tissue]
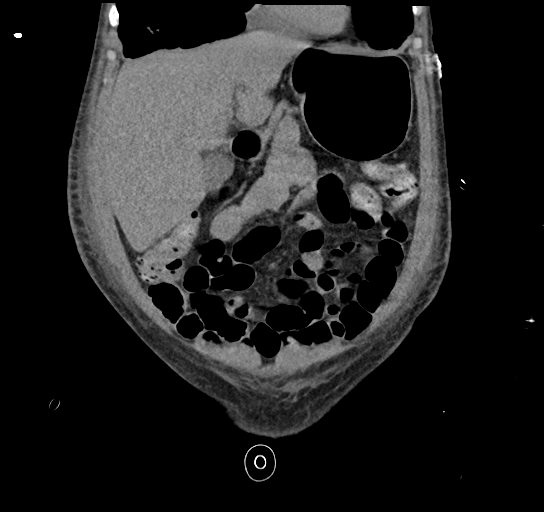
[im 42/94  soft-tissue]
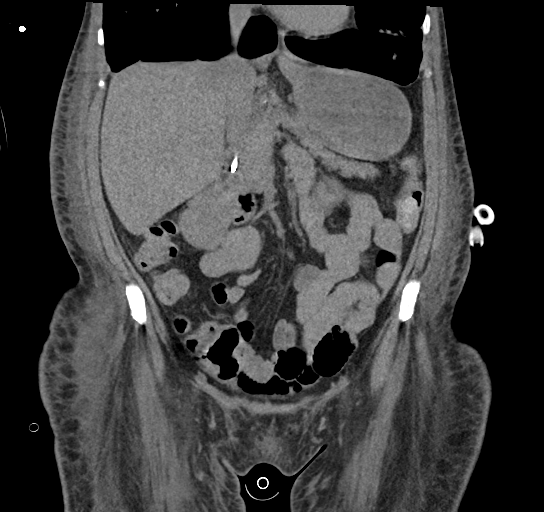
[im 52/94  soft-tissue]
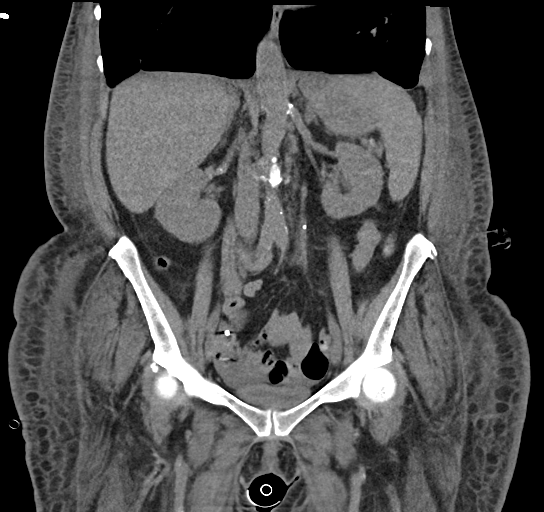

[16 of 46 positions shown; findings below may reference images not displayed]

FINDINGS: Lower chest: Patchy bibasilar nodular airspace disease is
identified, which may reflect infection or aspiration. Denser area
of consolidation within the posterior basilar segment of the left
lower lobe may reflect atelectasis or scarring. No effusion or
pneumothorax.

Unenhanced CT was performed per clinician order. Lack of IV contrast
limits sensitivity and specificity, especially for evaluation of
abdominal/pelvic solid viscera.

Hepatobiliary: No focal liver abnormality is seen. No gallstones,
gallbladder wall thickening, or biliary dilatation.

Pancreas: Unremarkable. No pancreatic ductal dilatation or
surrounding inflammatory changes.

Spleen: Normal in size without focal abnormality.

Adrenals/Urinary Tract: No urinary tract calculi or obstructive
uropathy. The bladder is decompressed, limiting its evaluation. The
adrenals are normal.

Stomach/Bowel: Percutaneous gastrostomy tube within the stomach.
There is moderate gaseous distention of the stomach.

No bowel obstruction or ileus. Normal appendix right lower quadrant.
No bowel wall thickening or inflammatory change.

Vascular/Lymphatic: Aortic atherosclerosis. No enlarged abdominal or
pelvic lymph nodes.

Reproductive: Uterus and bilateral adnexa are unremarkable.

Other: Trace pelvic free fluid. No free intraperitoneal gas. No
abdominal wall hernia.

Musculoskeletal: No acute or destructive bony lesions. There is
diffuse subcutaneous edema consistent with anasarca. Reconstructed
images demonstrate no additional findings.
IMPRESSION: 1. Patchy bibasilar airspace disease consistent with multifocal
pneumonia or aspiration.
2. No urinary tract calculi or obstructive uropathy.
3. Trace pelvic free fluid, nonspecific.
4. Diffuse anasarca.
5. Otherwise unremarkable unenhanced exam.

## 2022-08-04 IMAGING — DX DG CHEST 1V PORT
1 series · 2 of 2 positions shown · non-contrast
Comparison: Chest x-ray 07/03/2020.

CLINICAL DATA: Respiratory failure.

EXAM:
PORTABLE CHEST 1 VIEW

[Series 1: chest · 0.14mm/px · 2 of 2 slices shown]
[im 1/2]
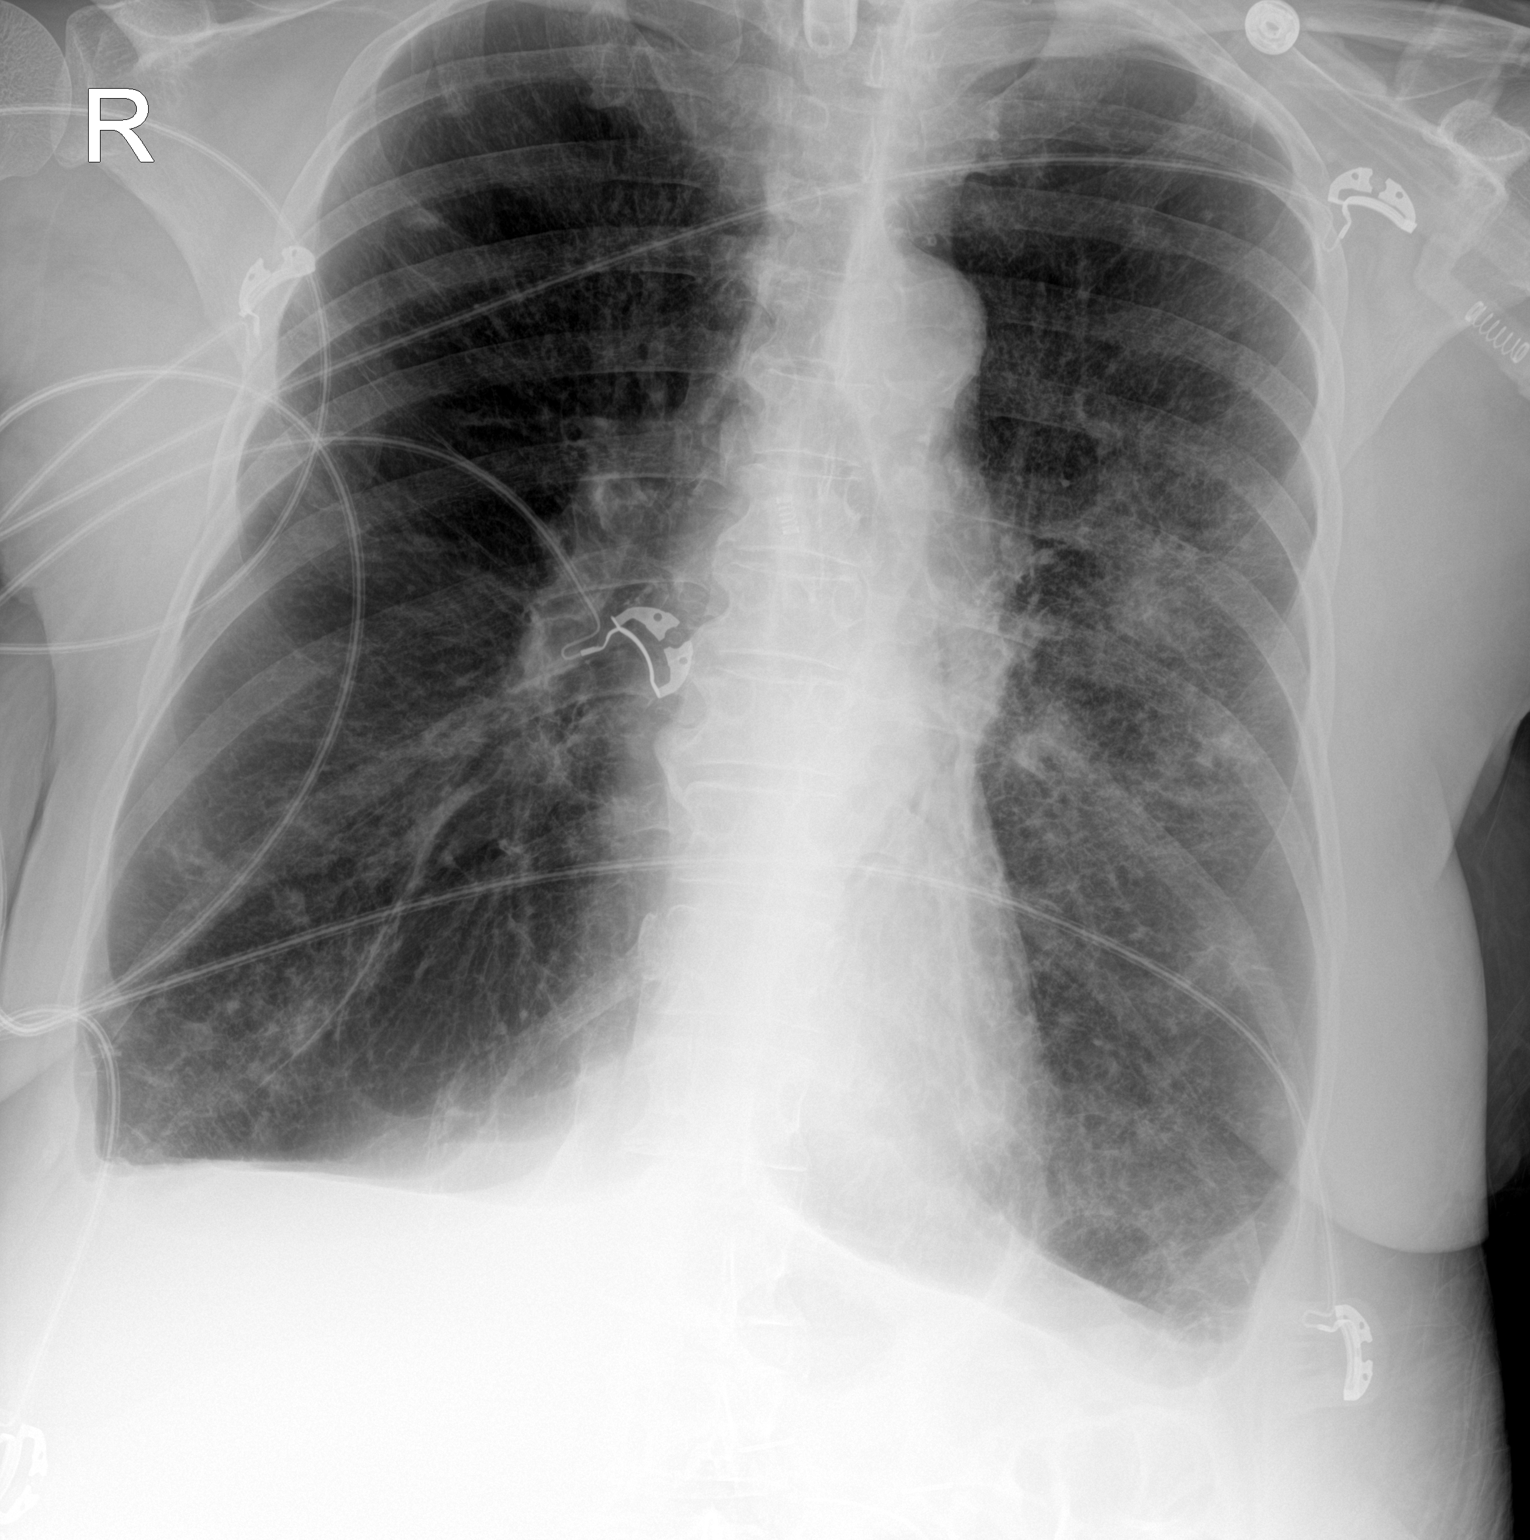
[im 2/2]
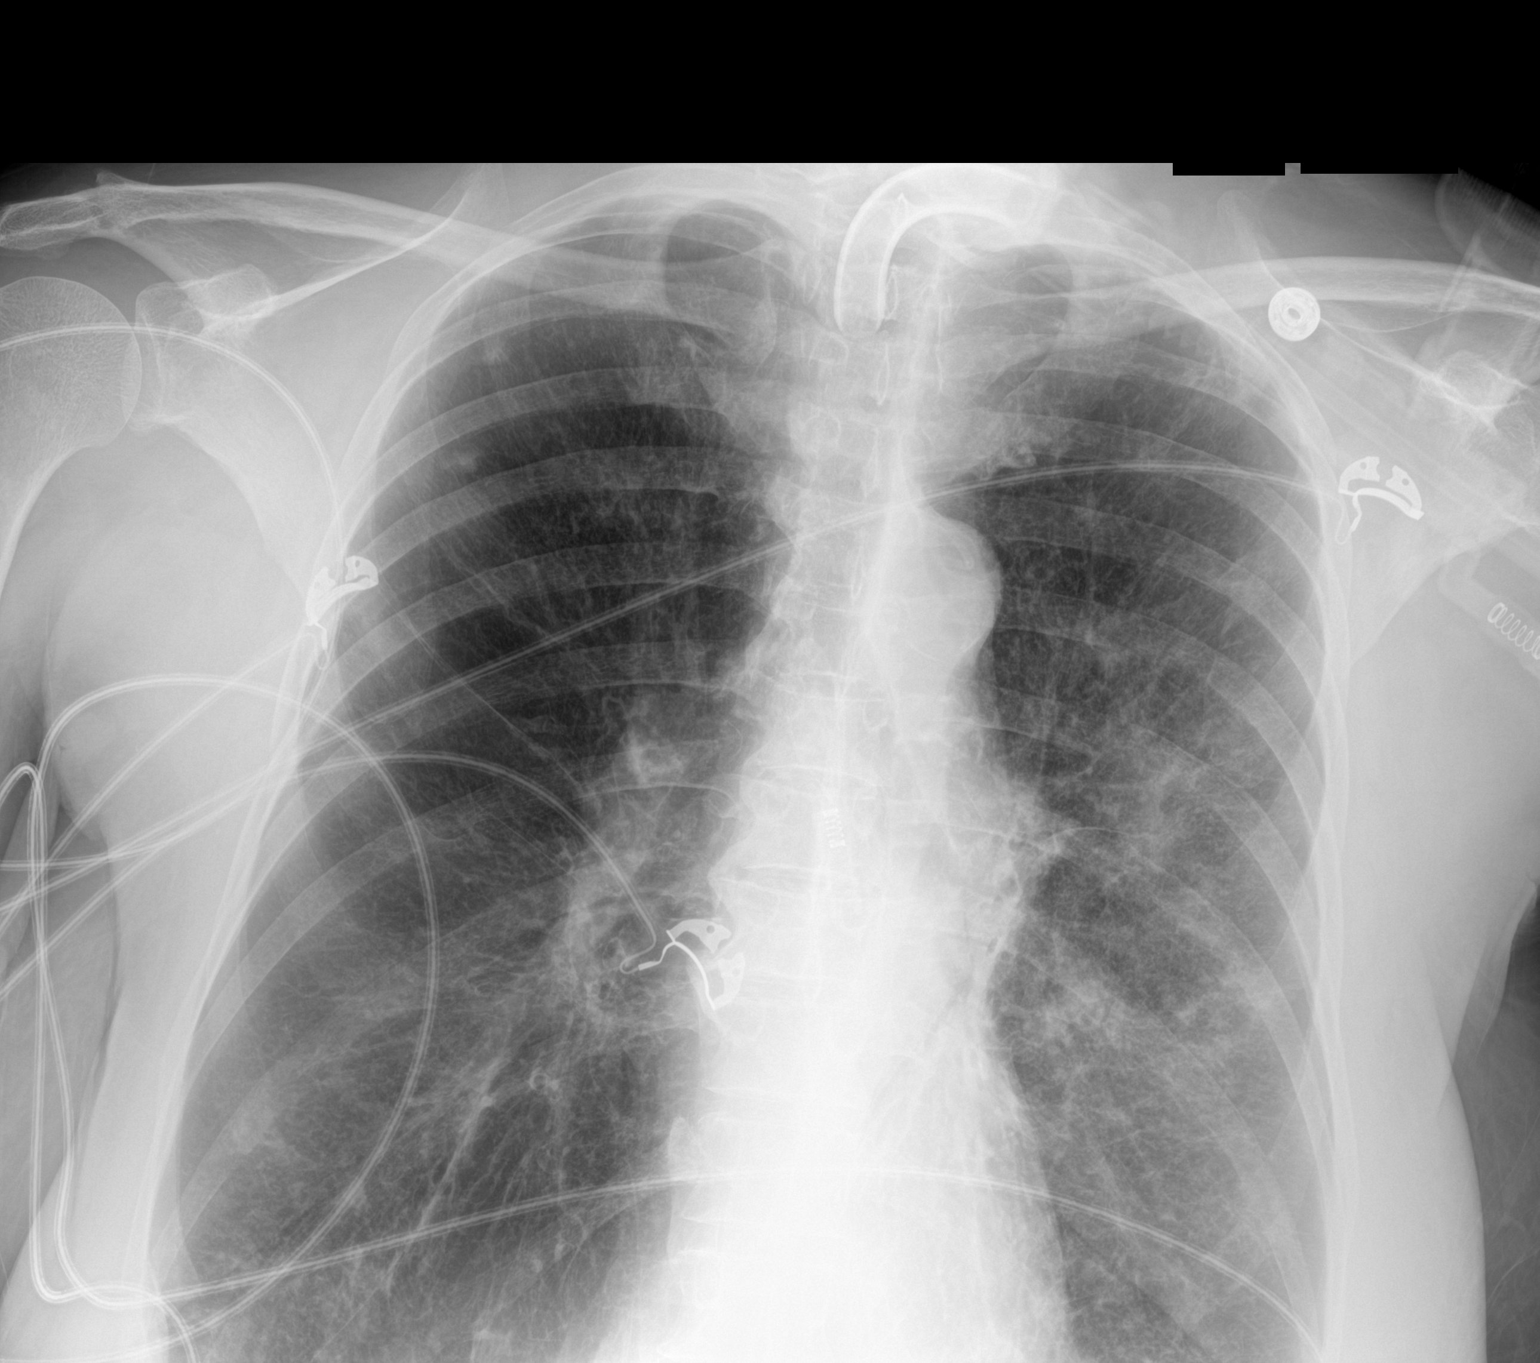

[2 of 2 positions shown; findings below may reference images not displayed]

FINDINGS: Tracheostomy tube in stable position.

The heart size and mediastinal contours are within normal limits.
Aortic arch calcifications.

Hyperinflation with flattening of the hemidiaphragms consistent with
emphysematous changes. Coarsened interstitial markings.
Redemonstration of a couple of right upper lobe subcentimeter
nodularities. Redemonstration of left mid lung zone patchy airspace
opacity as well as to lesser extent within the right lower lung
zone. Bilateral trace pleural effusions. No pneumothorax.

No acute osseous abnormality.
IMPRESSION: 1. Persistent multifocal pneumonia. Superimposed pulmonary edema not
excluded.
2. Bilateral trace pleural effusions.
3. Aortic Atherosclerosis (4QF6G-ZJ0.0) and Emphysema ([id number].W).

## 2022-08-12 IMAGING — DX DG CHEST 1V PORT
1 series · 1 of 1 positions shown · non-contrast
Comparison: 07/04/2020

CLINICAL DATA: Short of breath, tracheostomy

EXAM:
PORTABLE CHEST 1 VIEW

[chest ap]
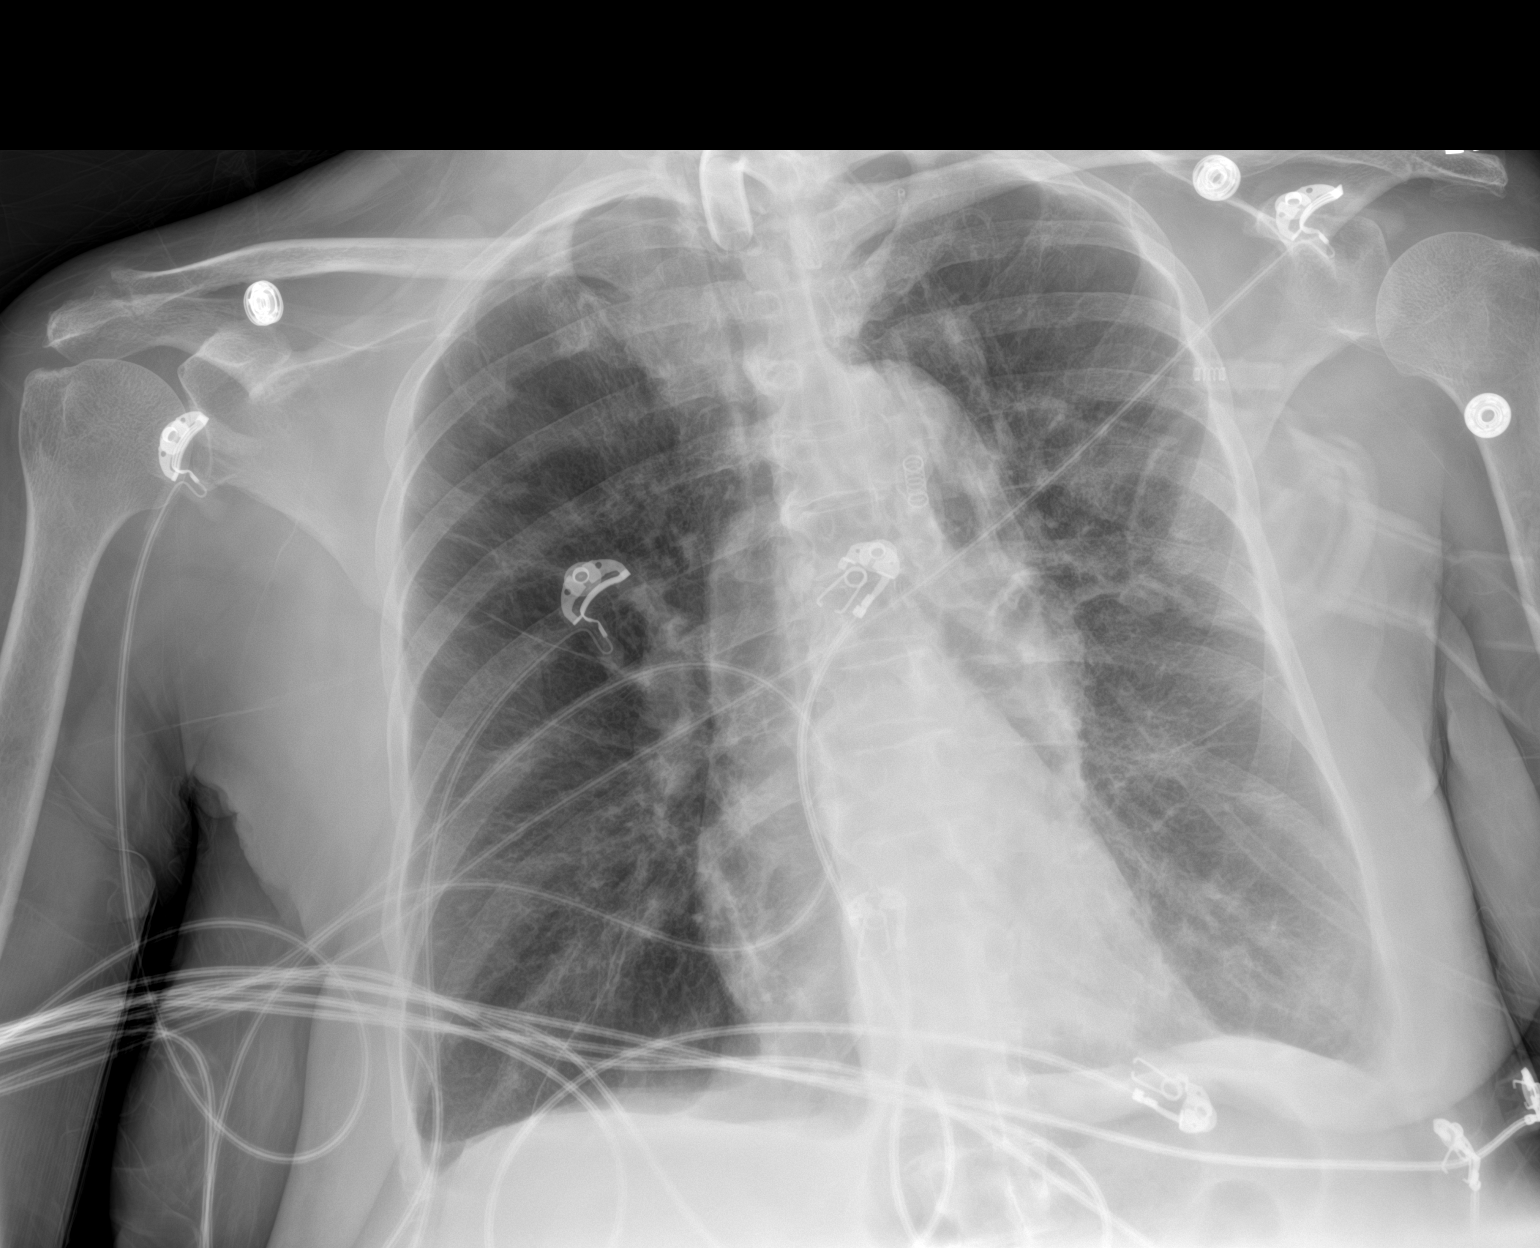

[1 of 1 positions shown; findings below may reference images not displayed]

FINDINGS: Single frontal view of the chest demonstrates tracheostomy tube tip
overlying tracheal air column at thoracic inlet. Cardiac silhouette
is stable. Lungs are hyperinflated with background emphysema again
noted. Significant improvement in the bilateral ground-glass
airspace disease seen previously, with minimal residual left basilar
consolidation. No effusion or pneumothorax. No acute bony
abnormalities.
IMPRESSION: 1. Significant improvement in bilateral airspace disease, with only
minimal residual left basilar consolidation.
2. Emphysema.

## 2022-08-21 IMAGING — DX DG CHEST 1V PORT
2 series · 2 of 2 positions shown · non-contrast
Comparison: July 12, 2020

CLINICAL DATA: Respiratory distress.

EXAM:
PORTABLE CHEST 1 VIEW

[chest ap (1 of 2)]
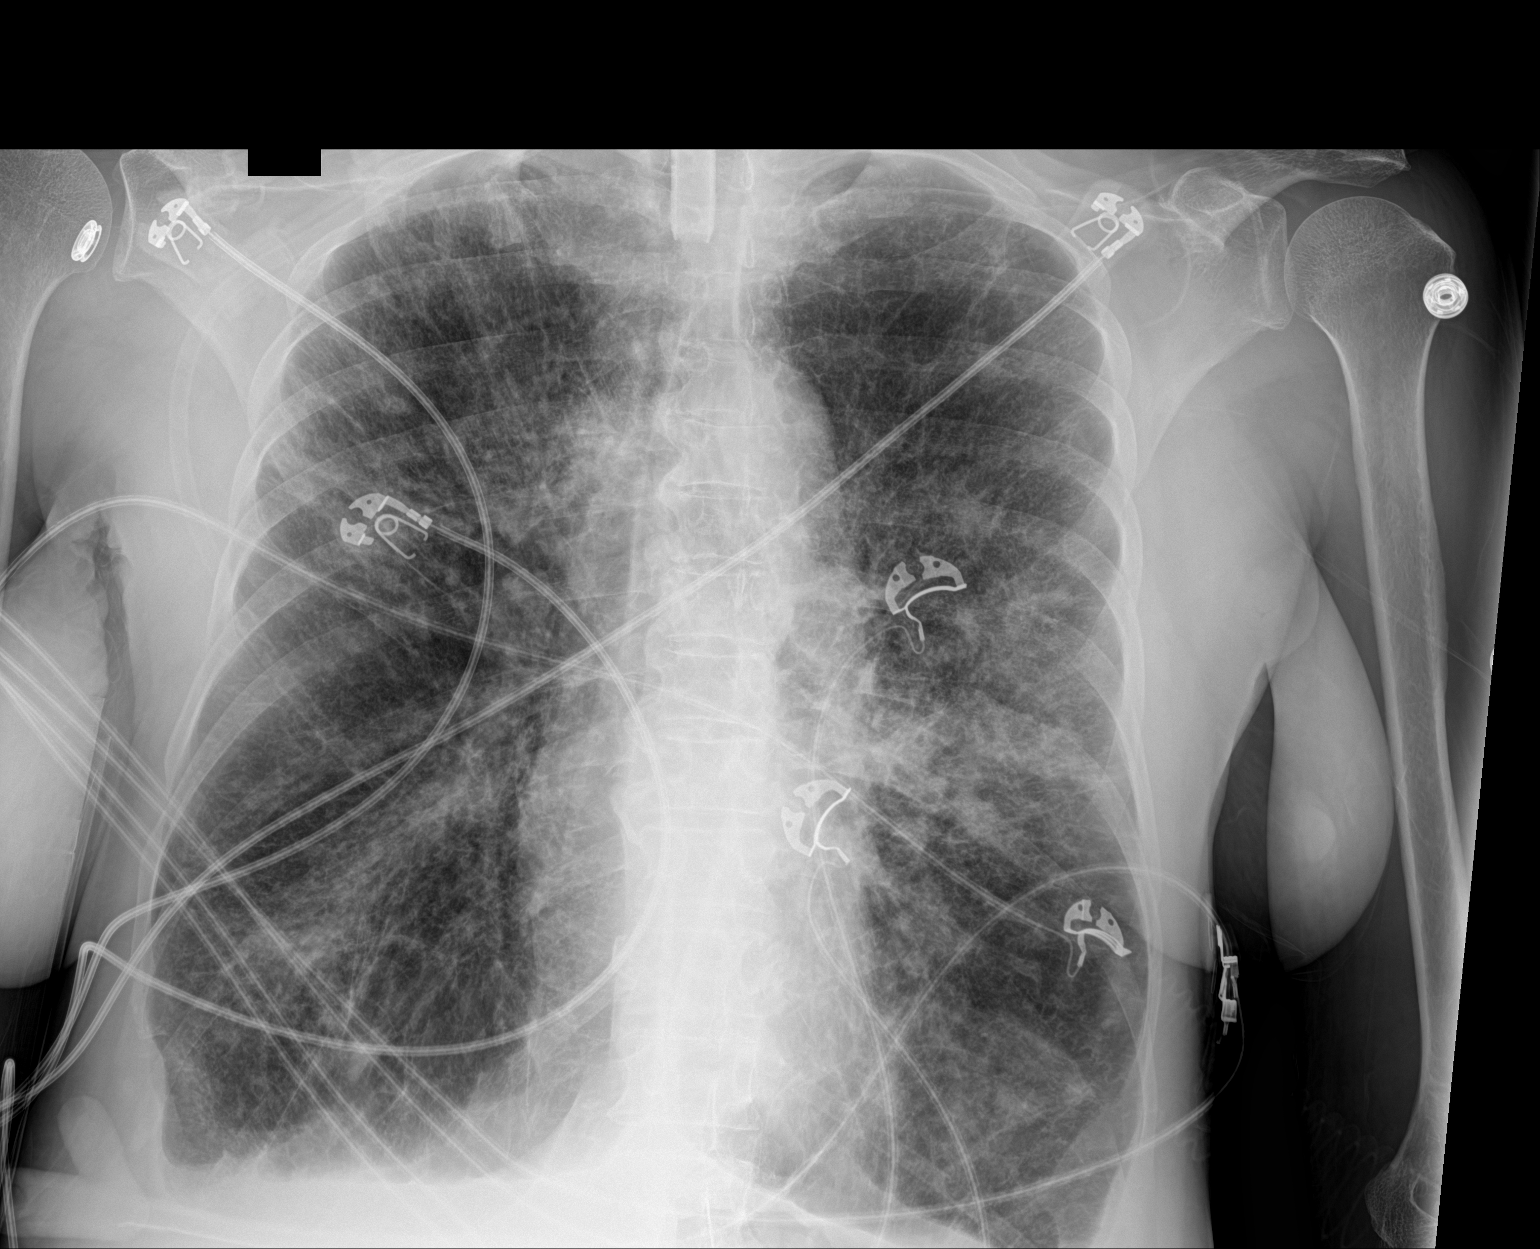

[chest ap (2 of 2)]
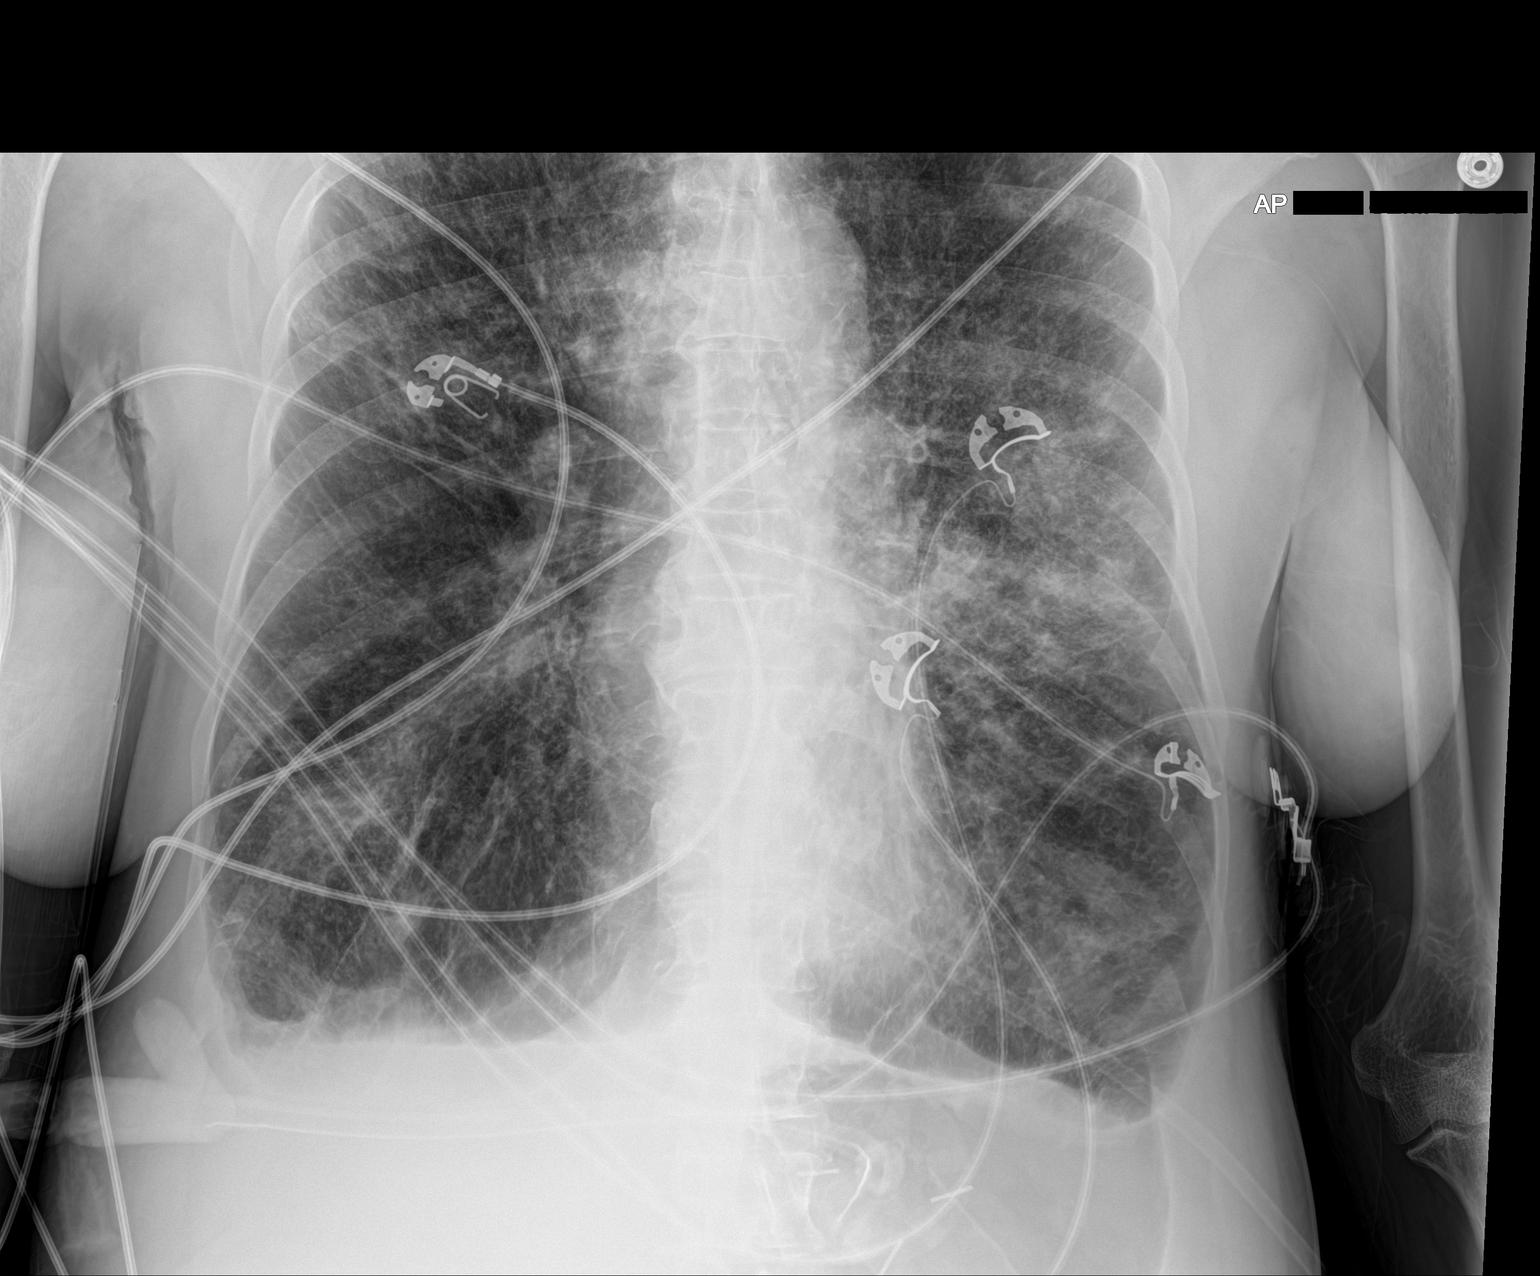

[2 of 2 positions shown; findings below may reference images not displayed]

FINDINGS: The heart, hila, and mediastinum are unremarkable. A tracheostomy
tube projects over the tracheal air column. No pneumothorax. Diffuse
bilateral pulmonary opacities with patchy areas of more confluent
opacity in the left perihilar region and right perihilar regions.
Hyperinflation of the lungs. No obvious nodules or masses. Blunting
of the costophrenic angles could represent small pleural effusions
versus pleural thickening.
IMPRESSION: Diffuse bilateral patchy pulmonary opacities/infiltrates worrisome
for pneumonia. Recommend follow-up imaging to ensure resolution
after treatment.

## 2022-08-21 IMAGING — DX DG ABDOMEN 1V
1 series · 1 of 1 positions shown · non-contrast
Comparison: 07/21/2020

CLINICAL DATA: OG tube placement

EXAM:
ABDOMEN - 1 VIEW

[abdomen kub]
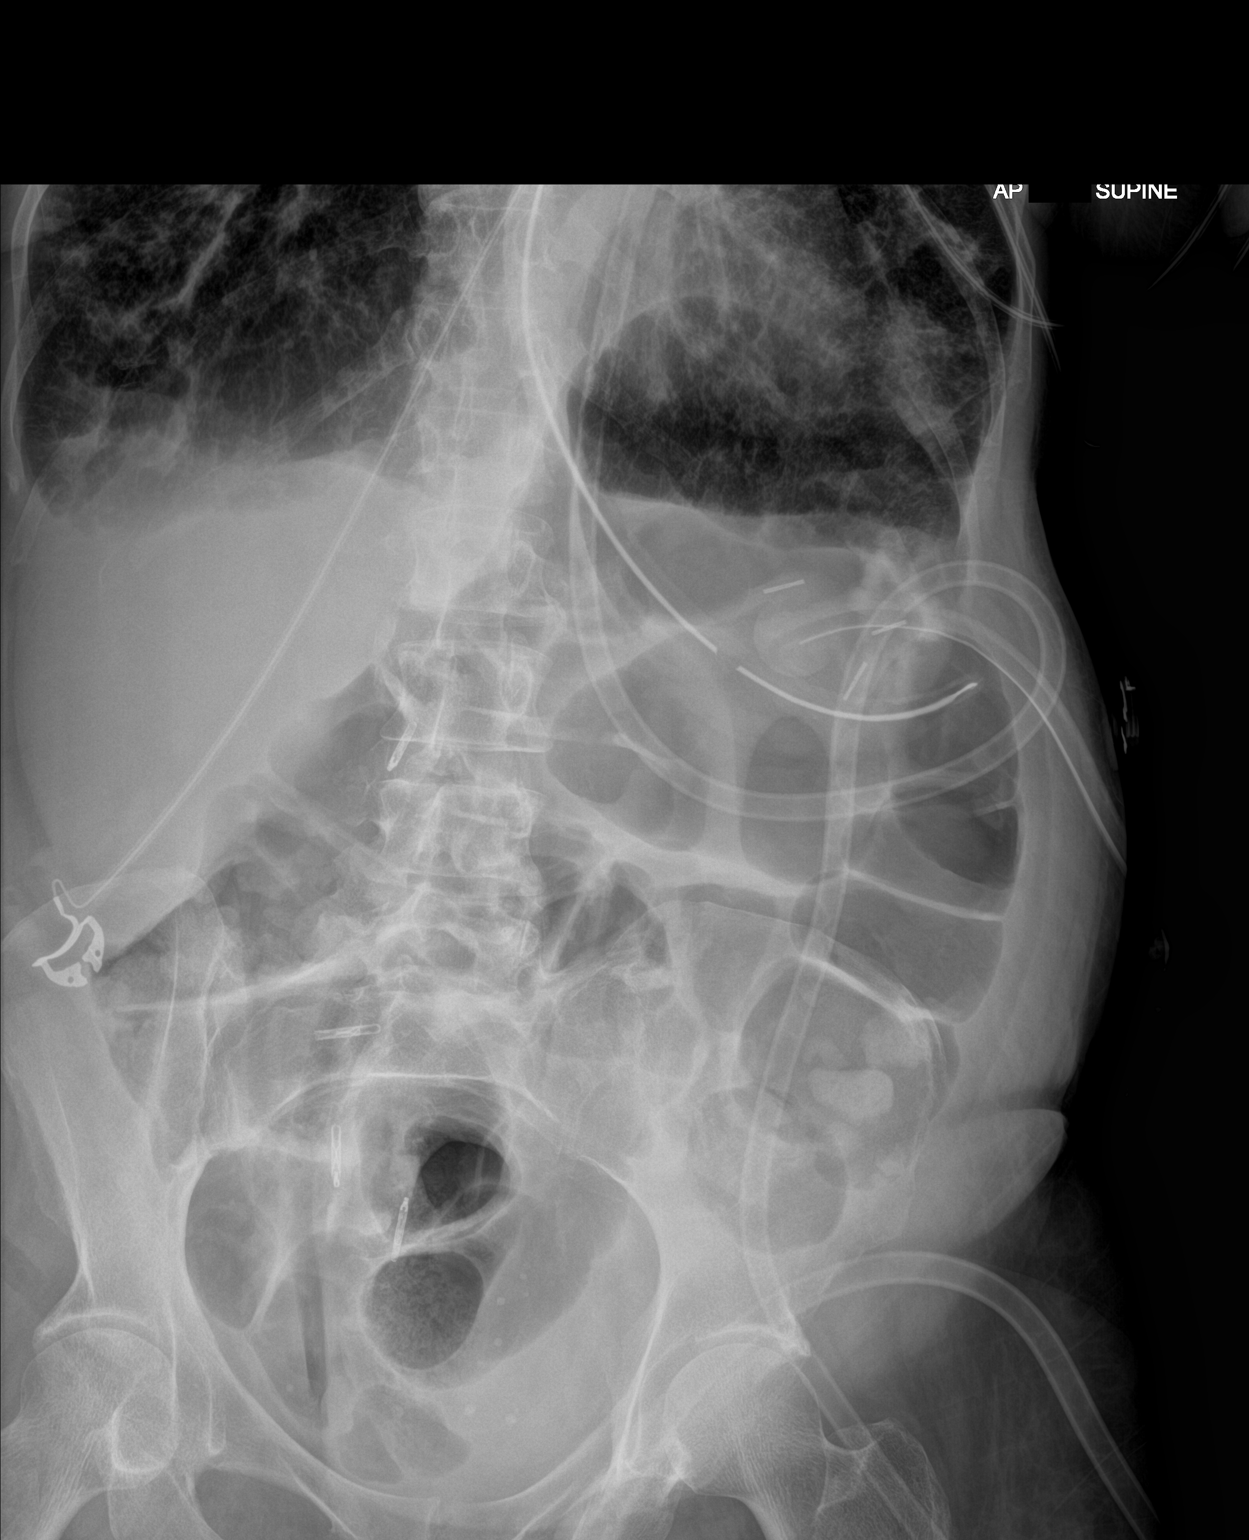

[1 of 1 positions shown; findings below may reference images not displayed]

FINDINGS: OG tube tip is in the stomach. Decreasing gaseous distention of the
stomach. Continued gaseous distention of the colon, likely ileus.
IMPRESSION: OG tube in the stomach.

## 2022-08-21 IMAGING — DX DG ABD PORTABLE 2V
1 series · 3 of 3 positions shown · non-contrast
Comparison: Chest x-ray obtained today; CT scan abdomen/pelvis
07/03/2020

CLINICAL DATA: 55-year-old female with abdominal distension and
pain

EXAM:
PORTABLE ABDOMEN - 2 VIEW

[Series 1: abdomen · 0.14mm/px · 3 of 3 slices shown]
[im 1/3]
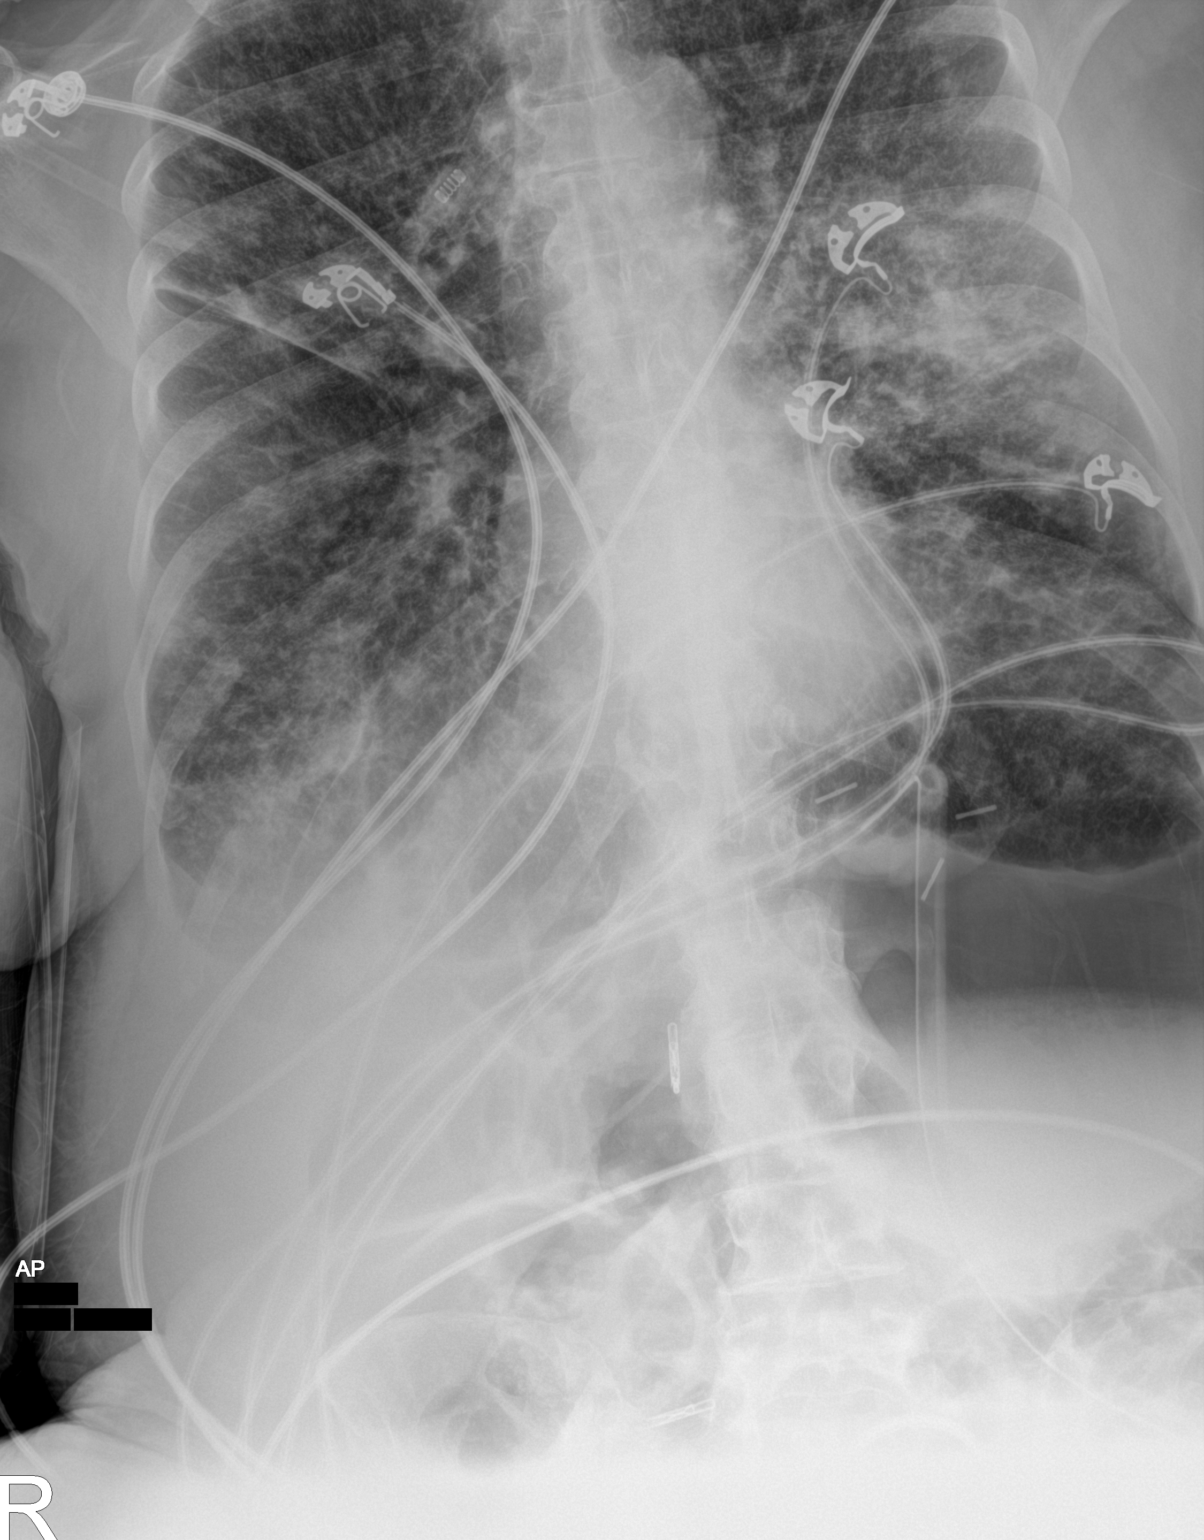
[im 2/3]
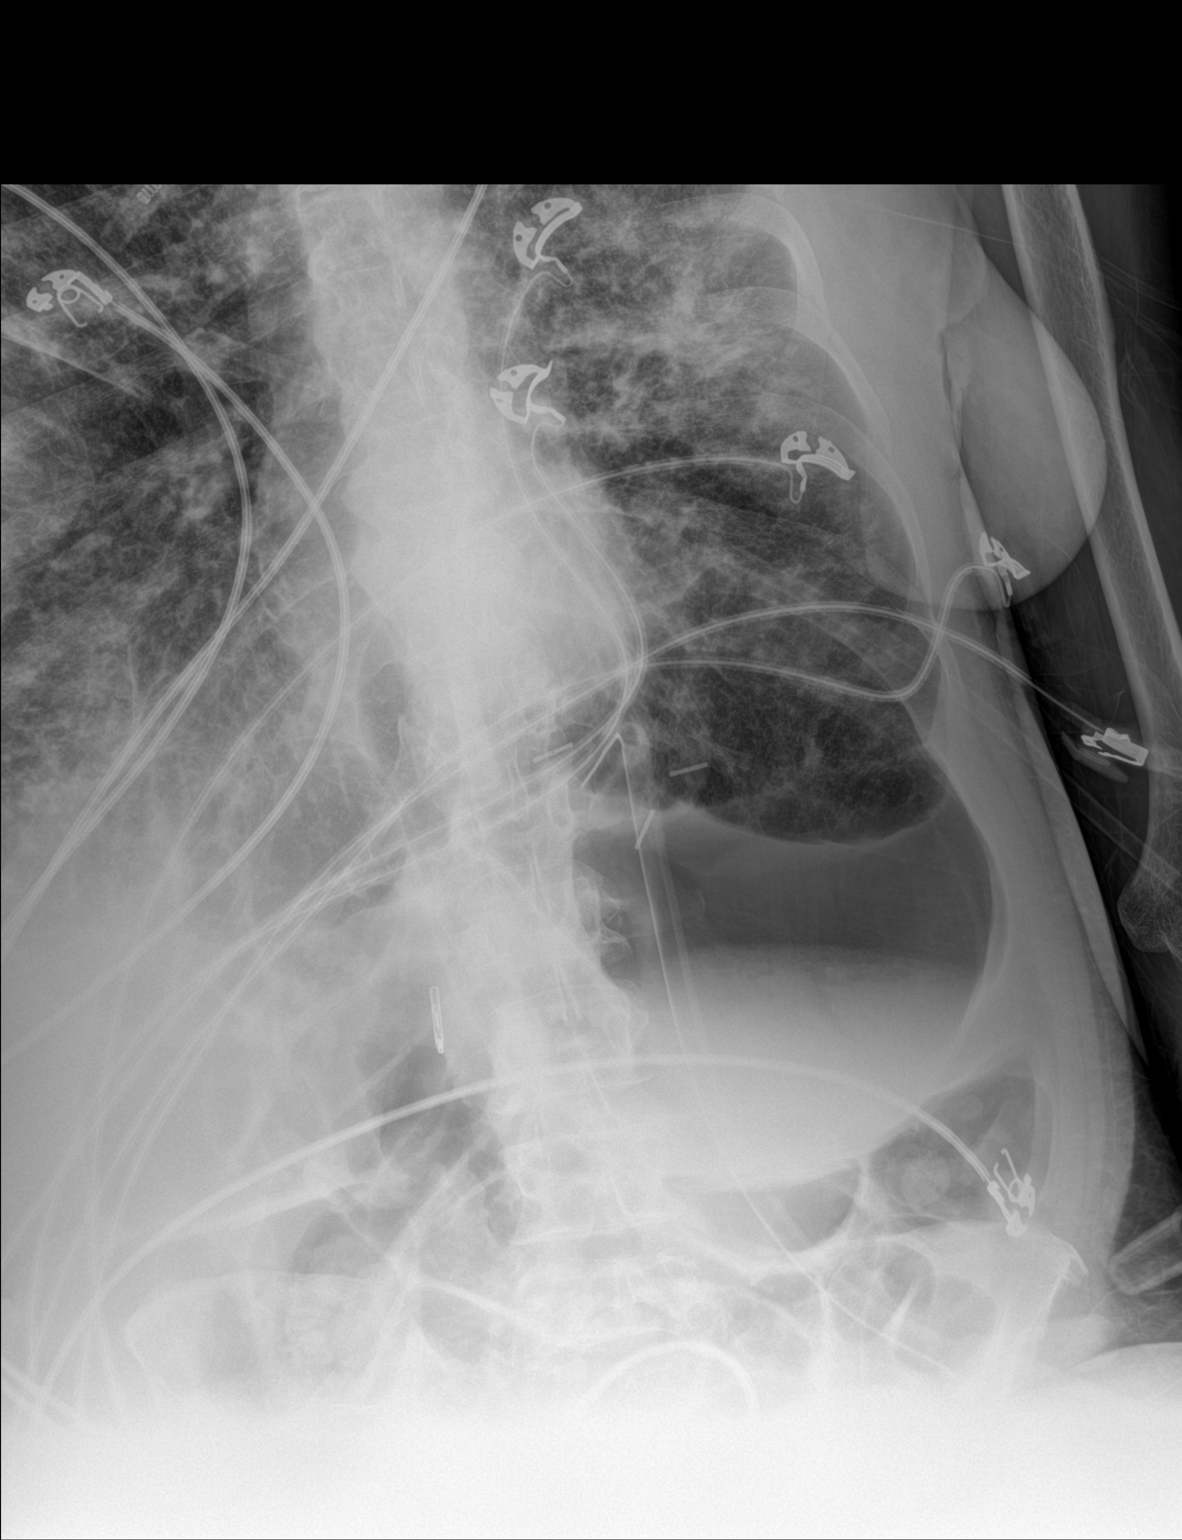
[im 3/3]
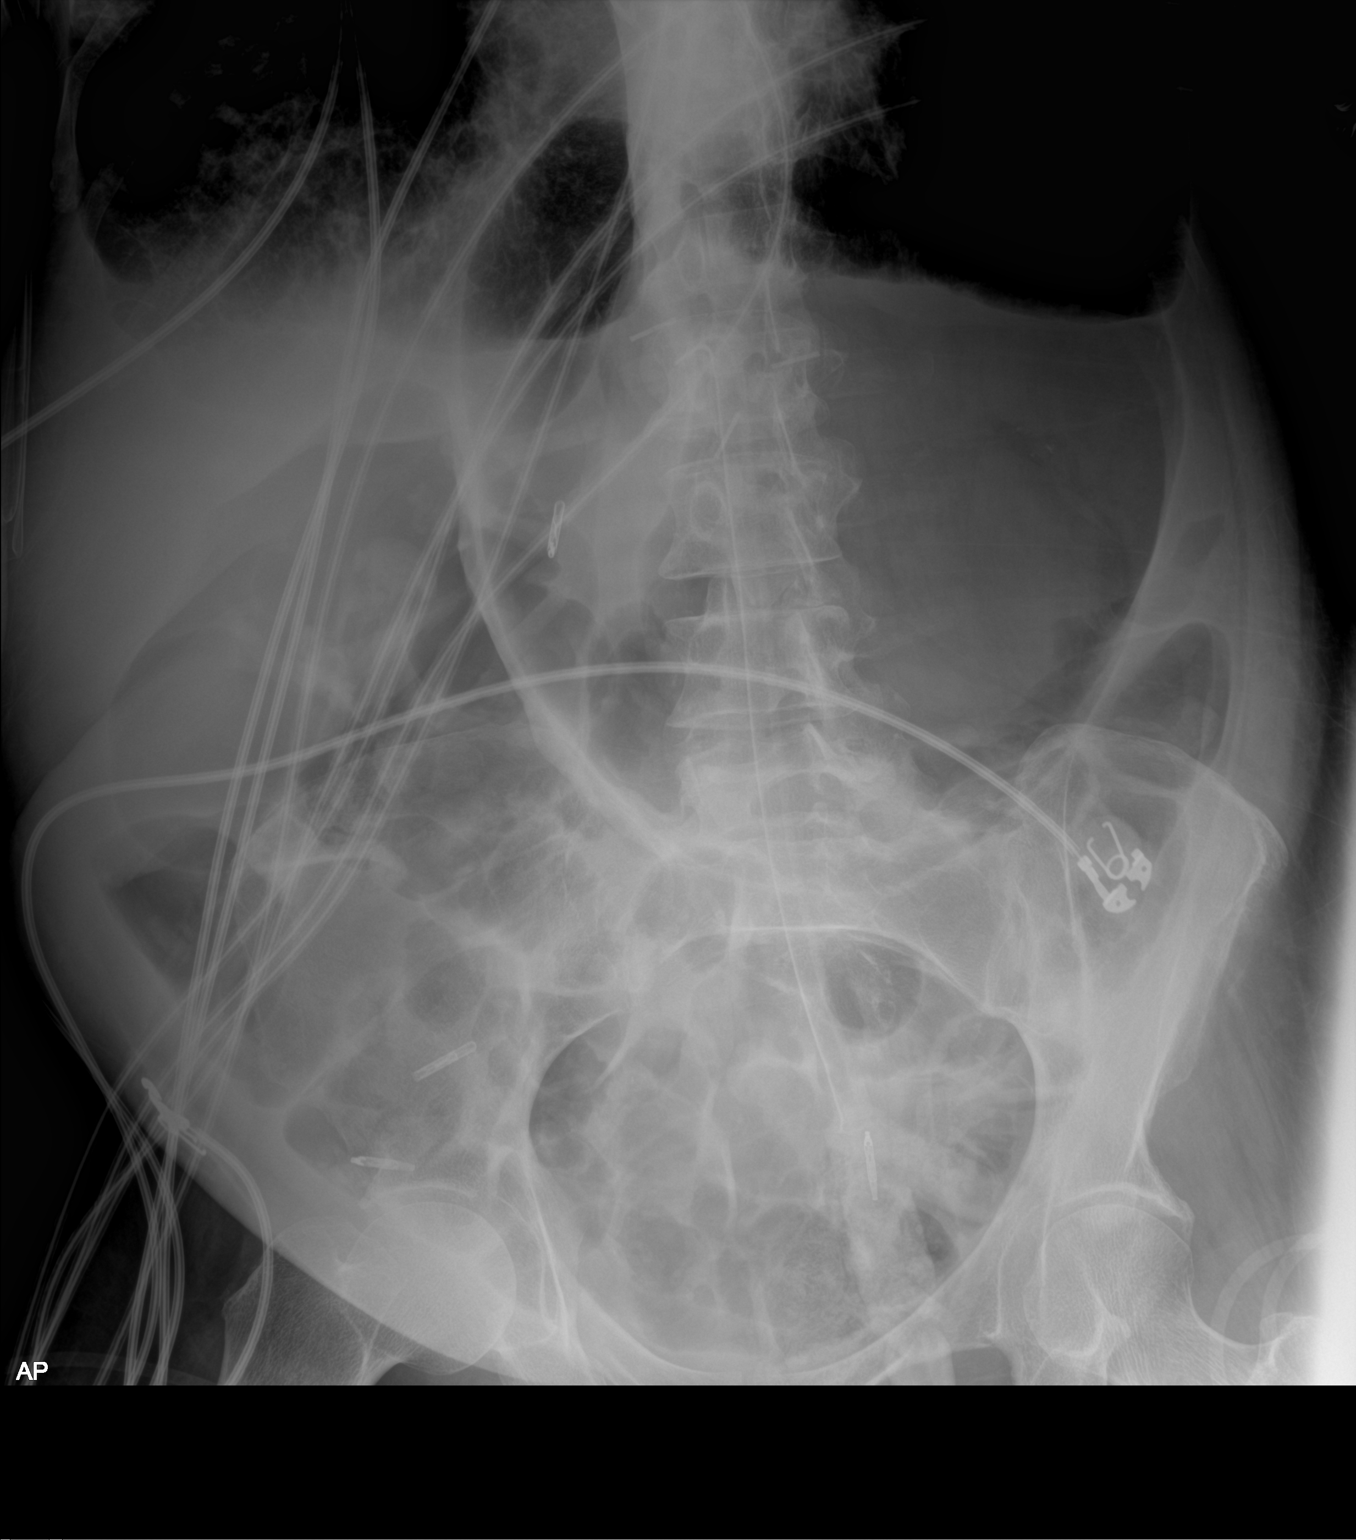

[3 of 3 positions shown; findings below may reference images not displayed]

FINDINGS: Marked gaseous distension of the stomach with positive air-fluid
level. A percutaneous gastrostomy tube is noted in place. Endoscopic
clips scattered throughout the fecal stream. The remainder of the
bowel gas pattern appears nonobstructed. Extensive interstitial and
airspace disease as seen earlier today.
IMPRESSION: Marked distension of the stomach despite the presence of a
percutaneous gastrostomy tube. Consider decompression via the
G-tube.

## 2022-08-30 IMAGING — DX DG CHEST 1V PORT
1 series · 1 of 1 positions shown · non-contrast
Comparison: 07/21/2020

CLINICAL DATA: Shortness of breath

Respiratory distress
EXAM:
PORTABLE CHEST 1 VIEW

[chest]
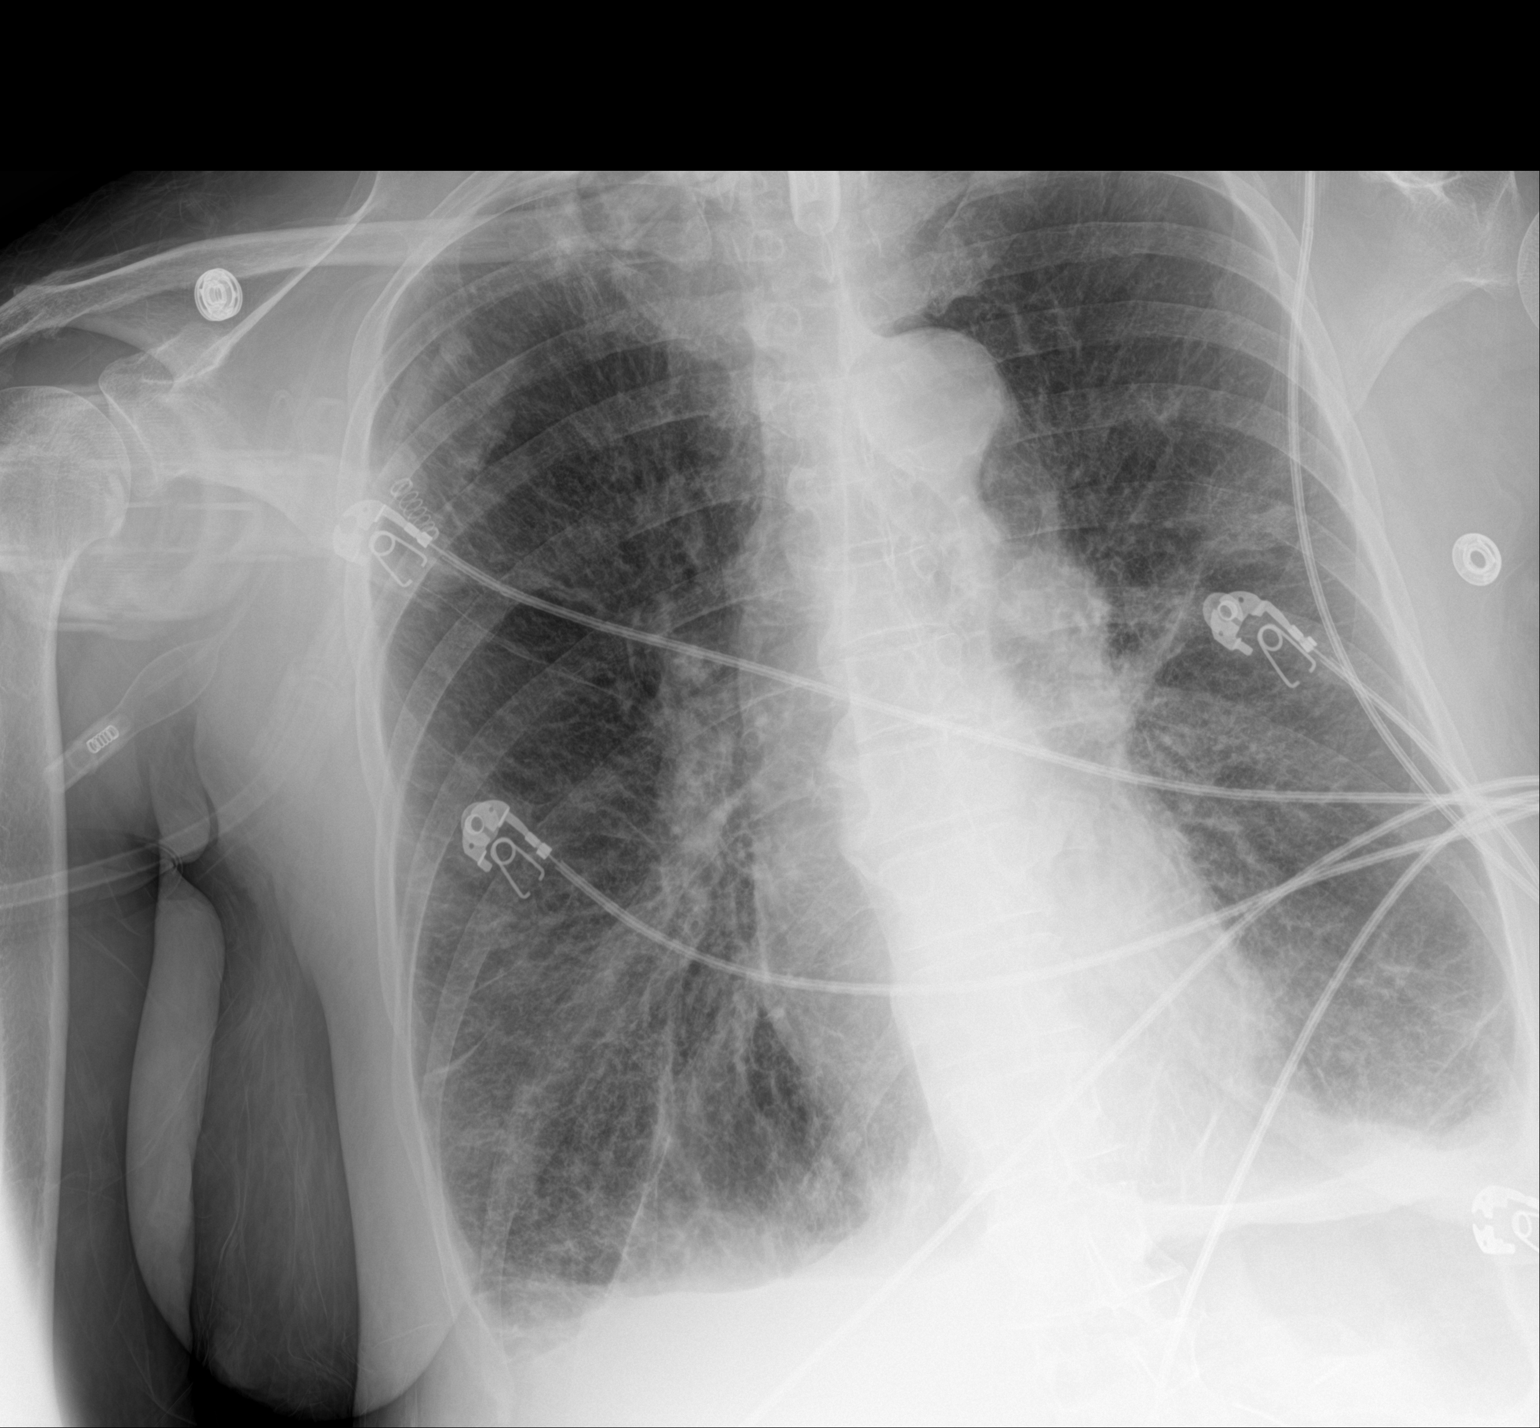

[1 of 1 positions shown; findings below may reference images not displayed]

FINDINGS: Heart size within normal limits. Lungs are hyperexpanded. Unchanged
blunting of the costophrenic angles may be due to scarring or small
pleural effusions.

Interval improvement in aeration of the lungs with mild patchy
opacities still remaining at the left mid lung.

Tracheostomy cannula unchanged in position.
IMPRESSION: Interval improvement in aeration of the lungs with mild patchy
opacities still remaining in the left mid lung indicative of
resolving pneumonia. Follow-up chest x-ray recommended 4-6 weeks to
document resolution of the left mid lung abnormality.

## 2022-09-29 IMAGING — DX DG CHEST 1V PORT
1 series · 2 of 2 positions shown · non-contrast
Comparison: 08/01/2020, CT from 02/28/2020

CLINICAL DATA: Shortness of breath

EXAM:
PORTABLE CHEST 1 VIEW

[Series 1: chest · 0.14mm/px · 2 of 2 slices shown]
[im 1/2]
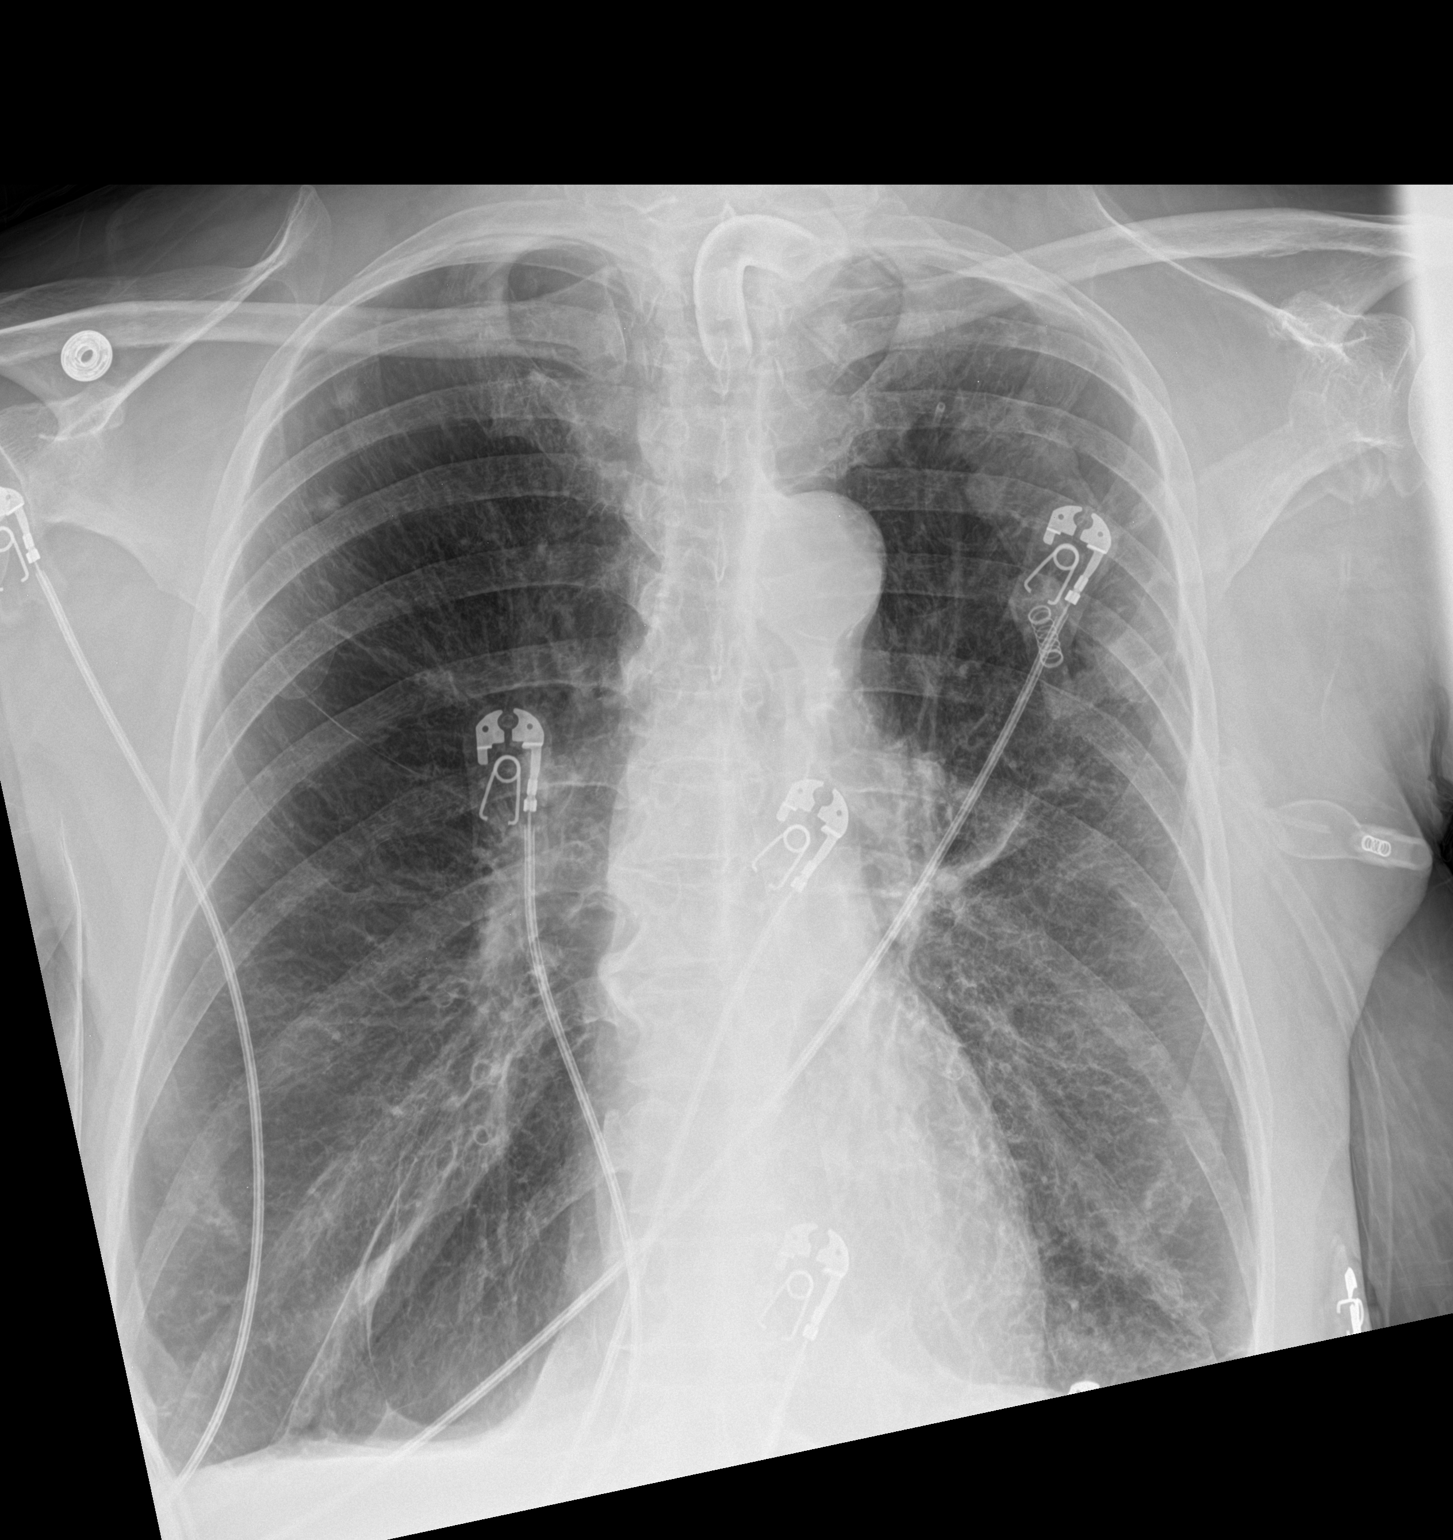
[im 2/2]
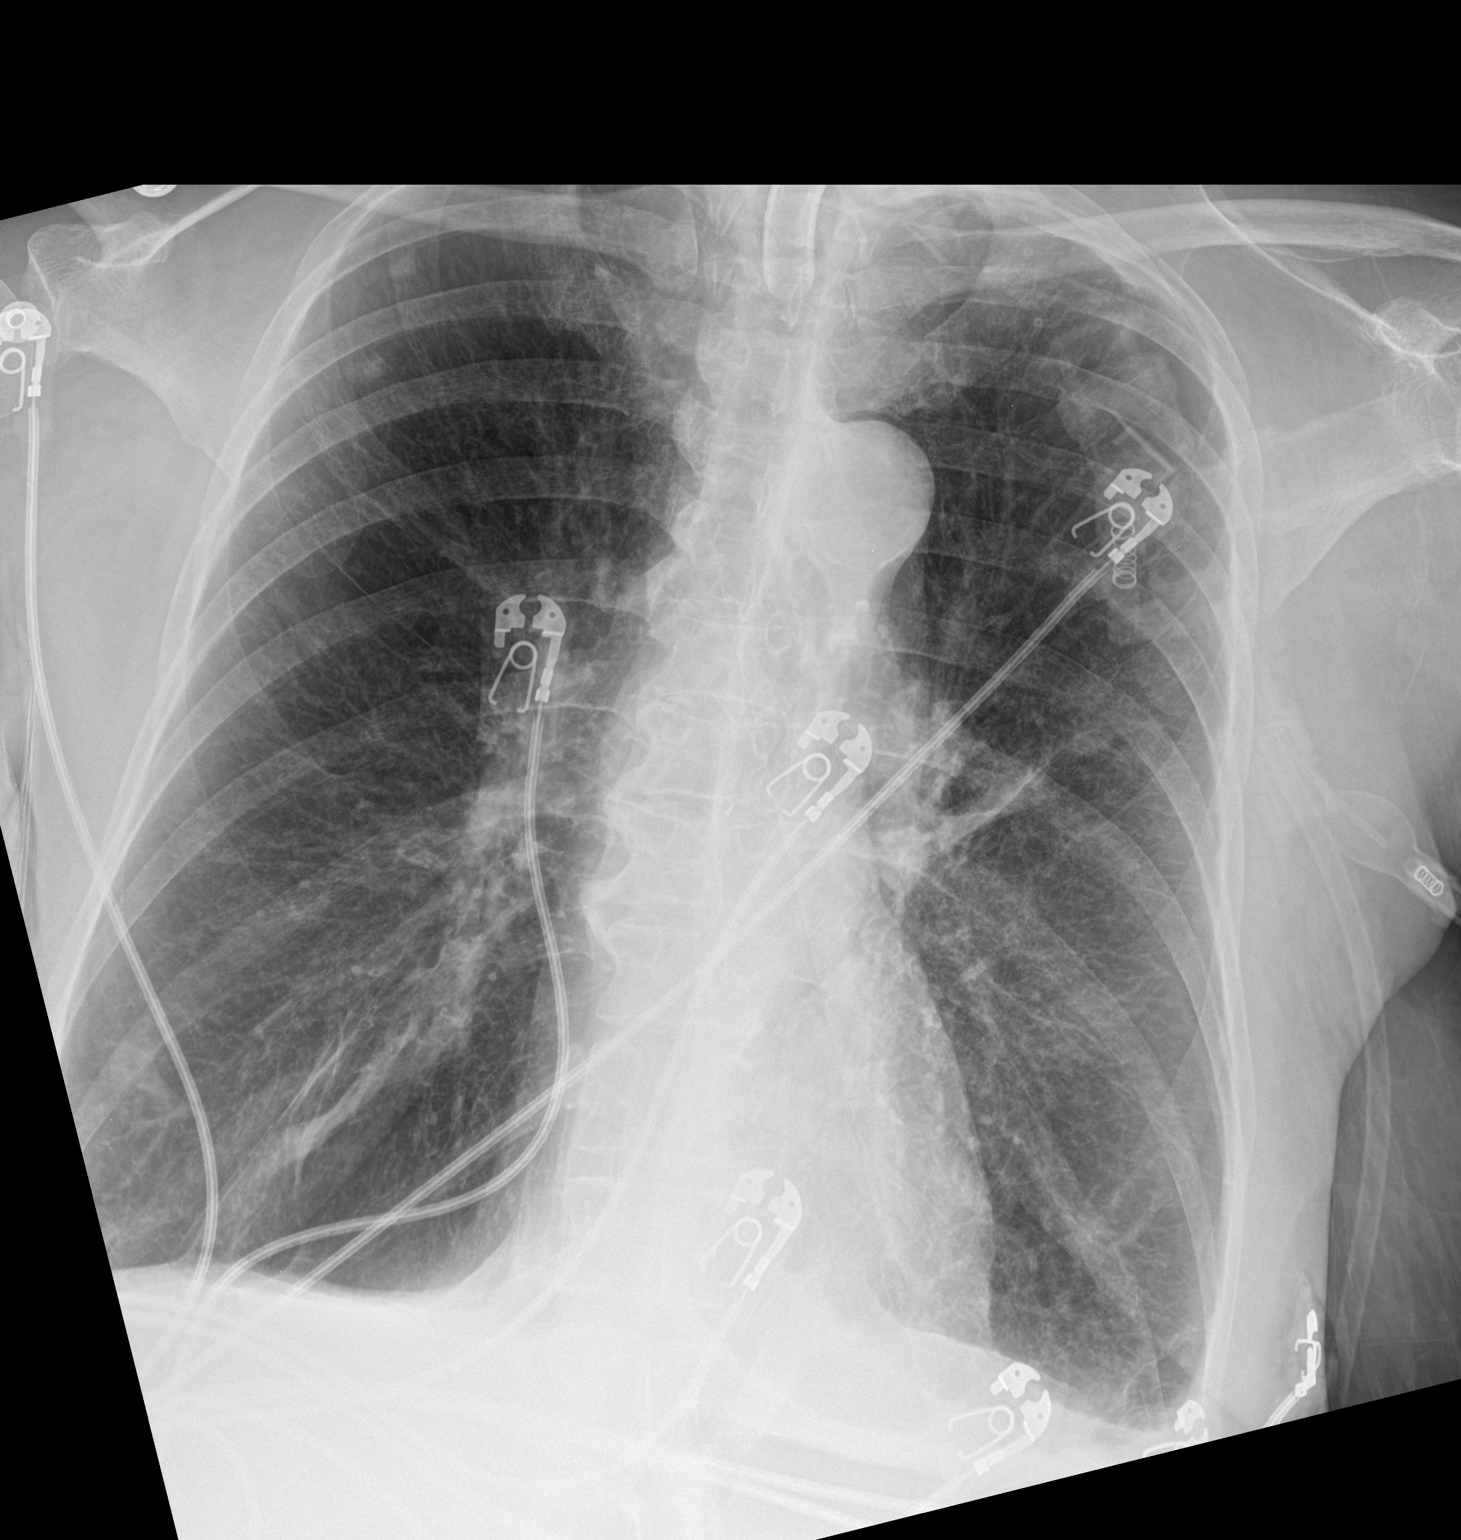

[2 of 2 positions shown; findings below may reference images not displayed]

FINDINGS: Tracheostomy tube is again seen and stable. Cardiac shadow is within
normal limits. The lungs are hyperinflated but stable. Some scarring
is noted in the right base. Small nodular changes are noted in the
right apex stable from the previous exam. No new focal infiltrate is
seen.
IMPRESSION: Stable nodular changes in the right apex unchanged from prior CT.

Hyperinflation consistent with COPD.

## 2022-09-29 IMAGING — CT CT ABD-PELV W/O CM
2 of 4 series · 17 of 46 positions shown, 19 images · non-contrast
Comparison: CT abdomen pelvis dated 07/03/2020.

CLINICAL DATA: 55-year-old female with abdominal pain and fever.

EXAM:
CT ABDOMEN AND PELVIS WITHOUT CONTRAST
TECHNIQUE: Multidetector CT imaging of the abdomen and pelvis was performed
following the standard protocol without IV contrast.

[Series 3: a/p w/o 5mm · axial · non-contrast · 0.91mm/px · z∈[+643,+973]mm · 14 of 74 slices shown, 16 images]
[im 4/74  soft-tissue]
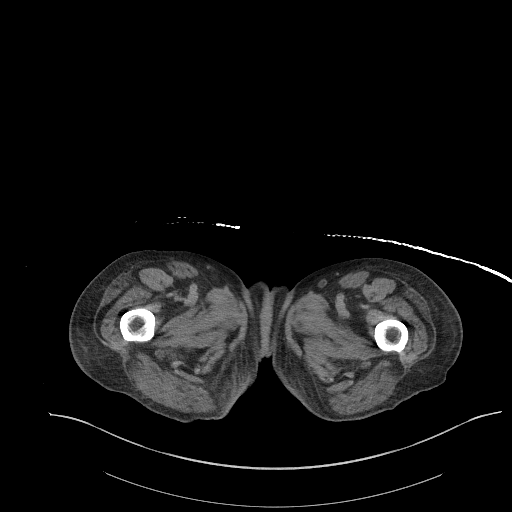
[im 4/74  bone]
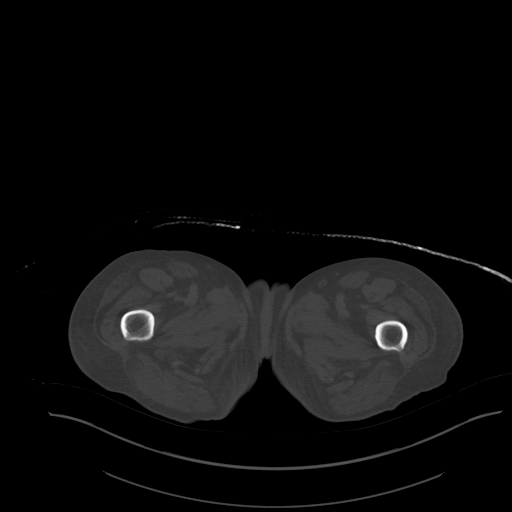
[im 10/74  soft-tissue]
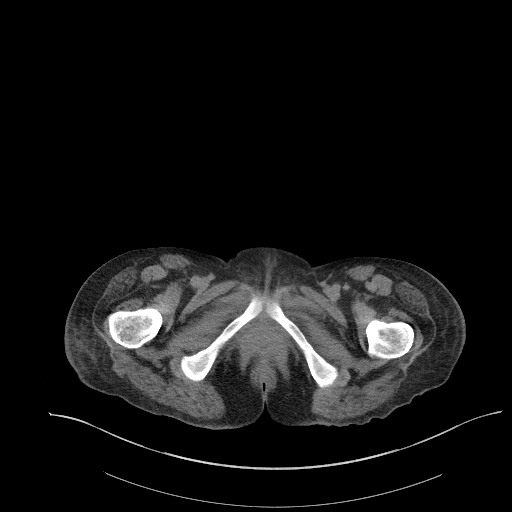
[im 13/74  soft-tissue]
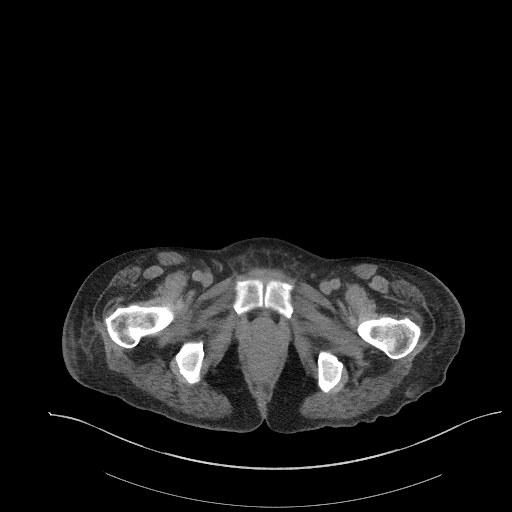
[im 20/74  soft-tissue]
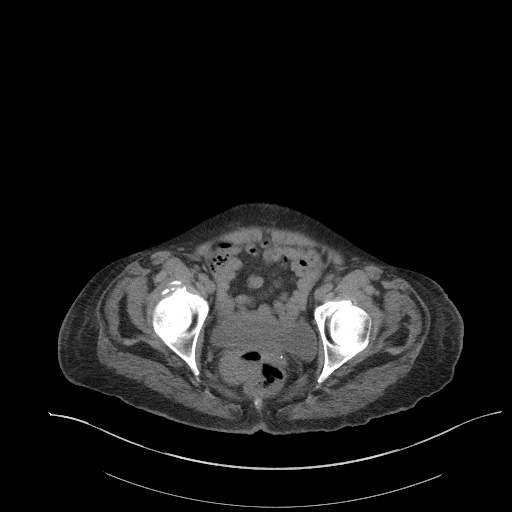
[im 26/74  soft-tissue]
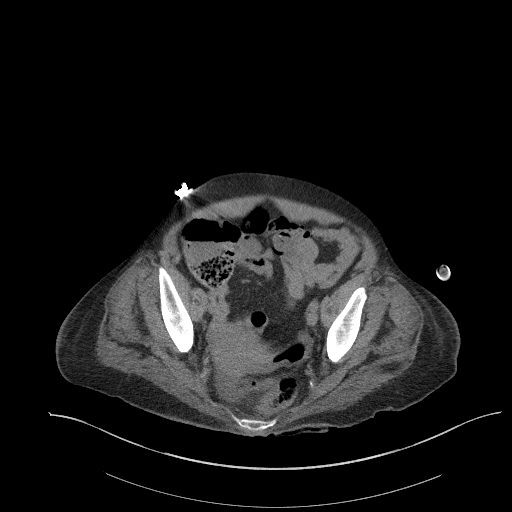
[im 29/74  soft-tissue]
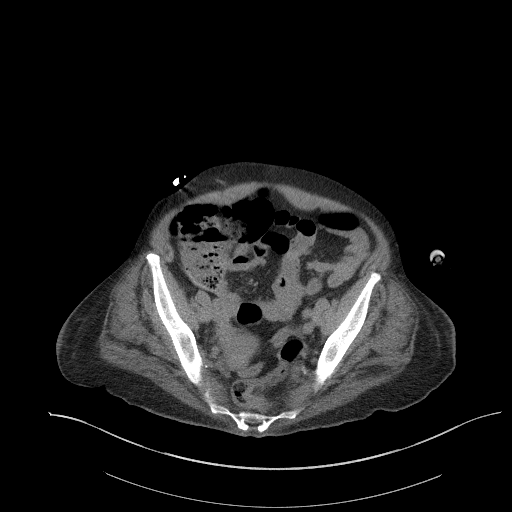
[im 35/74  soft-tissue]
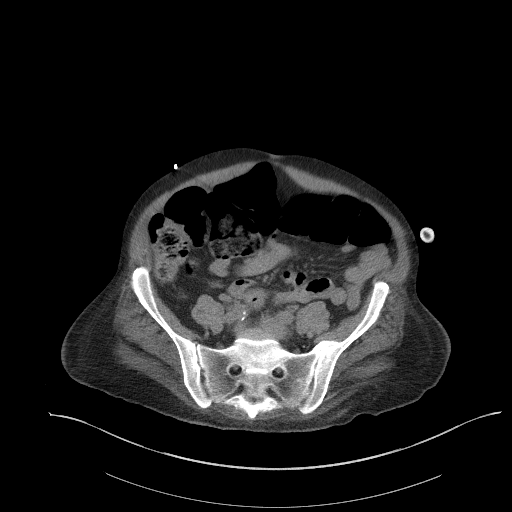
[im 39/74  soft-tissue]
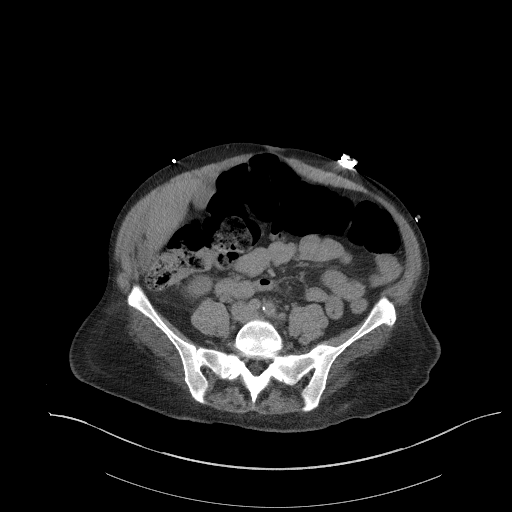
[im 45/74  soft-tissue]
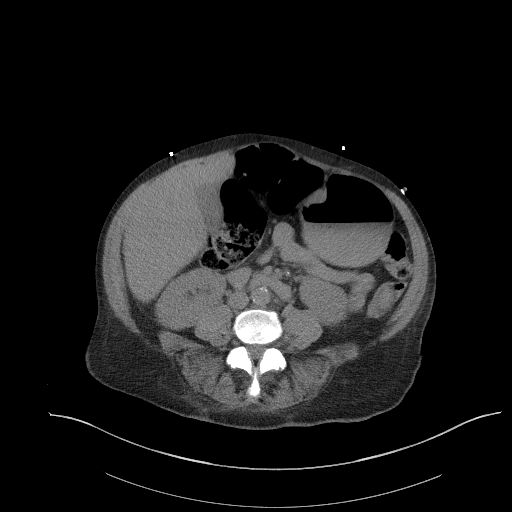
[im 45/74  bone]
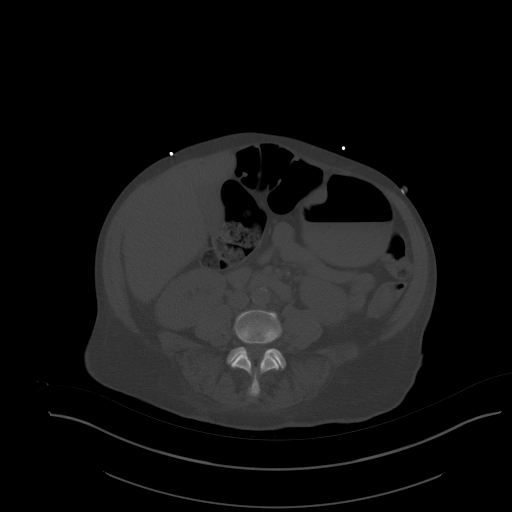
[im 48/74  soft-tissue]
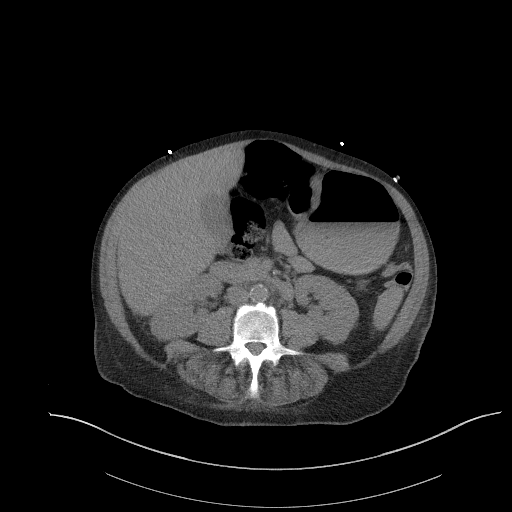
[im 54/74  soft-tissue]
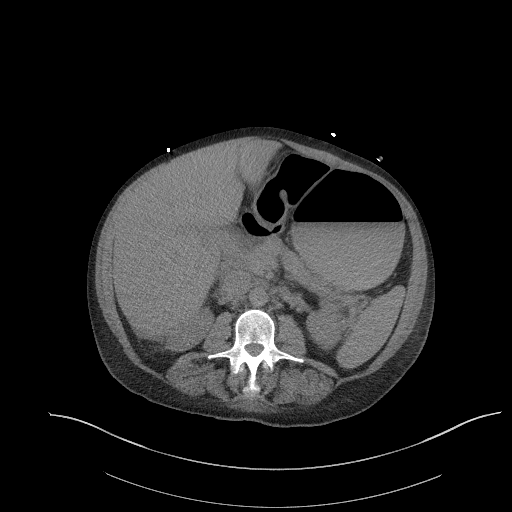
[im 61/74  soft-tissue]
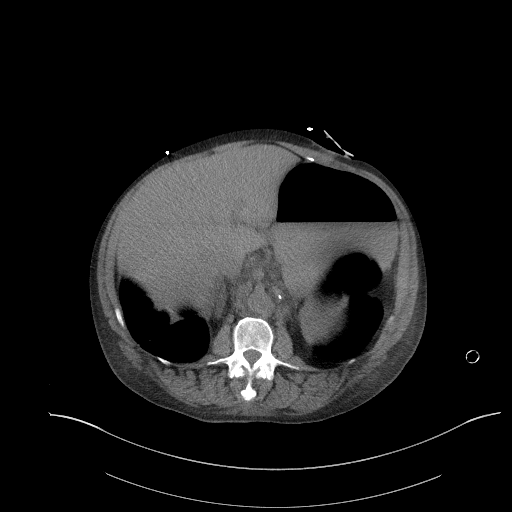
[im 64/74  soft-tissue]
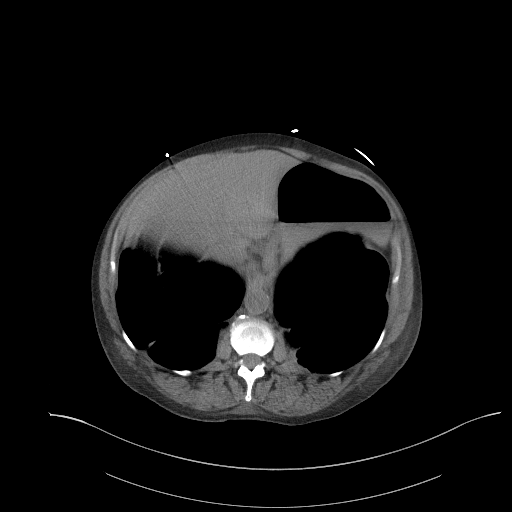
[im 70/74  soft-tissue]
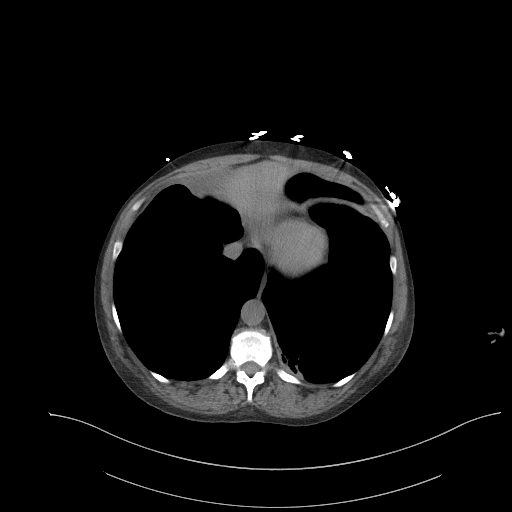

[Series 6: a/p w/o cor · coronal · non-contrast · 0.79mm/px · 3 of 156 slices shown]
[im 52/156  soft-tissue]
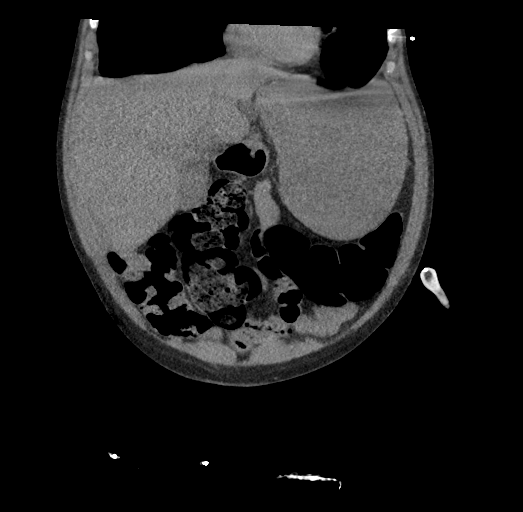
[im 69/156  soft-tissue]
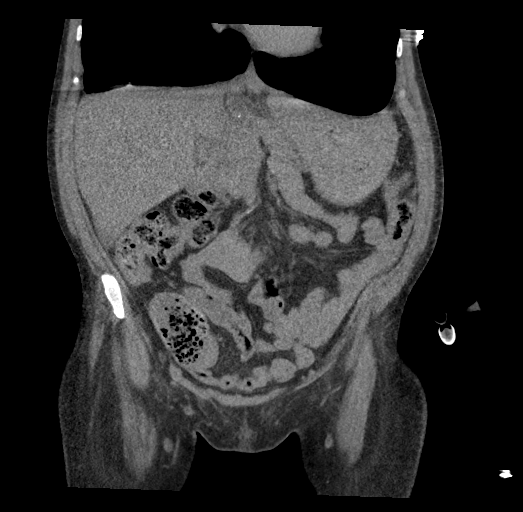
[im 87/156  soft-tissue]
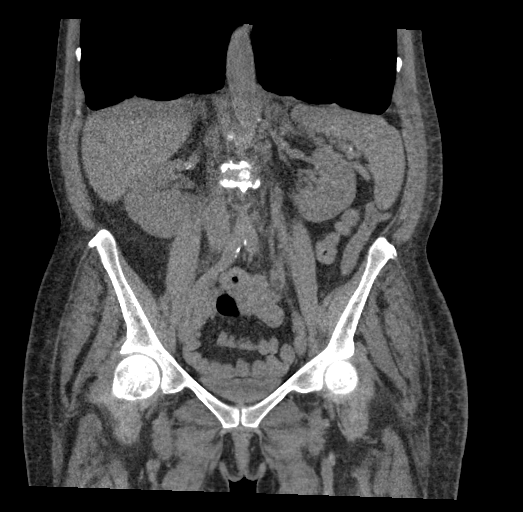

[17 of 46 positions shown; findings below may reference images not displayed]

FINDINGS: Evaluation of this exam is limited in the absence of intravenous
contrast.

Lower chest: Bibasilar scarring. Small chronic fluid noted along the
anterior pleural space.

No intra-abdominal free air.  Small free fluid in the pelvis.

Hepatobiliary: Diffuse fatty liver. No intrahepatic biliary ductal
dilatation. The gallbladder is unremarkable.

Pancreas: Unremarkable. No pancreatic ductal dilatation or
surrounding inflammatory changes.

Spleen: Normal in size without focal abnormality.

Adrenals/Urinary Tract: The adrenal glands unremarkable. The
kidneys, visualized ureters, and urinary bladder appear
unremarkable.

Stomach/Bowel: The stomach is distended. Percutaneous gastrostomy
with tip in the distal body of the stomach. There is no bowel
obstruction or active inflammation. The appendix is normal.

Vascular/Lymphatic: Mild aortoiliac atherosclerotic disease. The IVC
is unremarkable. No portal venous gas. There is no adenopathy.

Reproductive: The uterus is anteverted and grossly unremarkable. No
adnexal masses.

Other: None

Musculoskeletal: No acute or significant osseous findings.
IMPRESSION: 1. Distended stomach. No bowel obstruction. Normal appendix.
2. Fatty liver.
3. Aortic Atherosclerosis ([id number]).

## 2022-09-29 IMAGING — CT CT HEAD W/O CM
4 series · 16 of 47 positions shown, 18 images · non-contrast
Comparison: 03/19/2020

CLINICAL DATA: Decreased oxygen saturation. Rule out cerebral
hemorrhage.

EXAM:
CT HEAD WITHOUT CONTRAST
TECHNIQUE: Contiguous axial images were obtained from the base of the skull
through the vertex without intravenous contrast.

[Series 4: head without · axial · non-contrast · 0.46mm/px · z∈[+136,+256]mm · 7 of 34 slices shown, 9 images]
[im 5/34  brain]
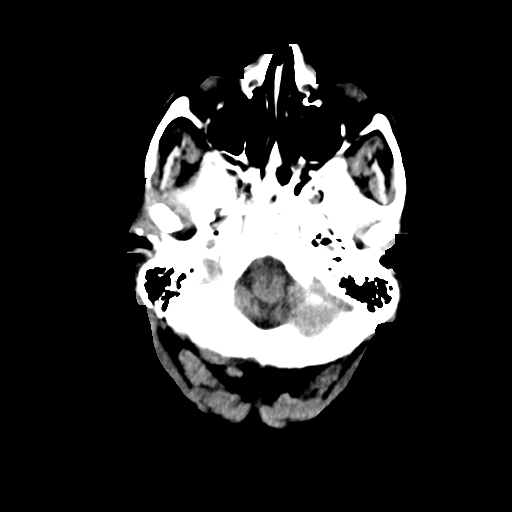
[im 5/34  bone]
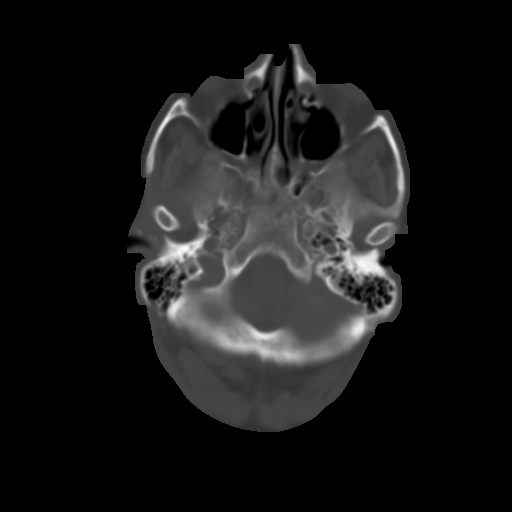
[im 9/34  brain]
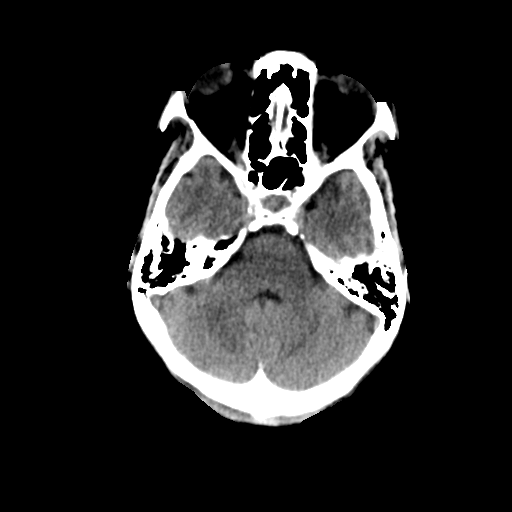
[im 13/34  brain]
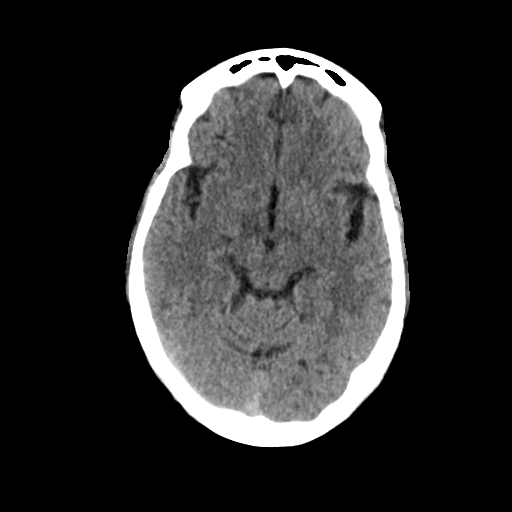
[im 17/34  brain]
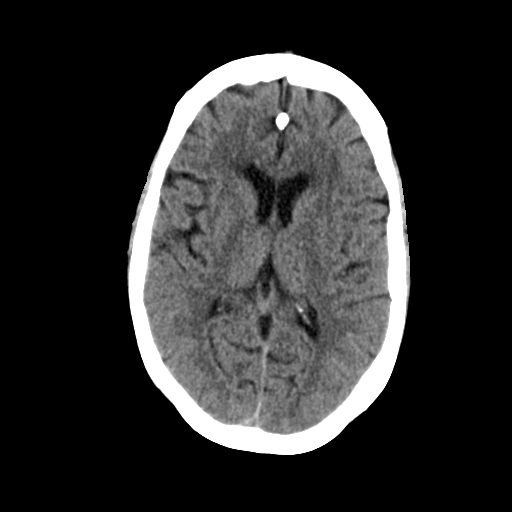
[im 21/34  brain]
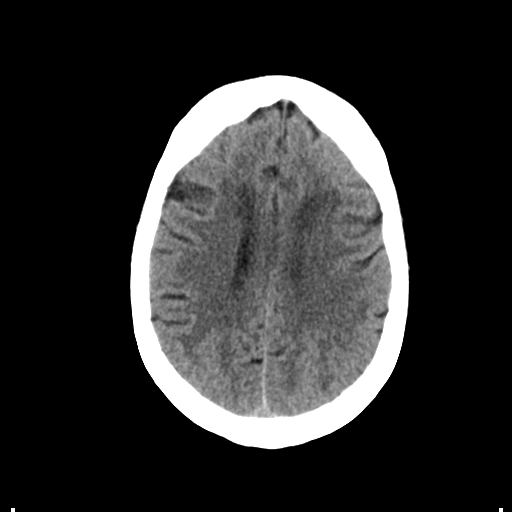
[im 21/34  bone]
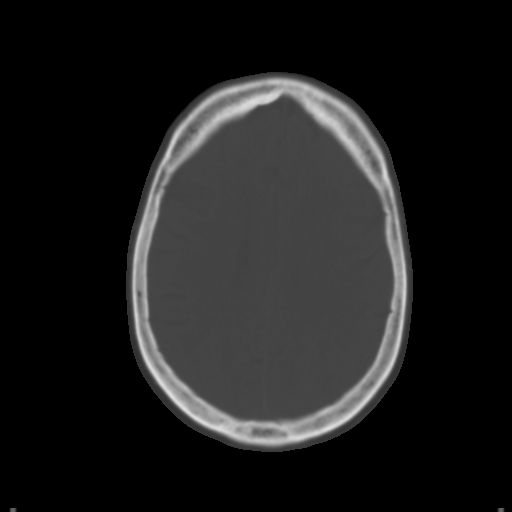
[im 25/34  brain]
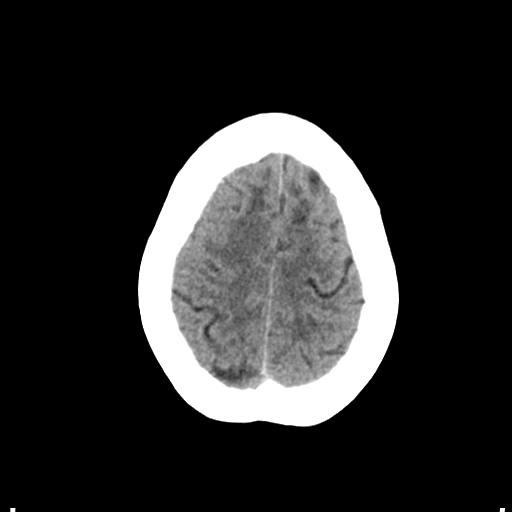
[im 29/34  brain]
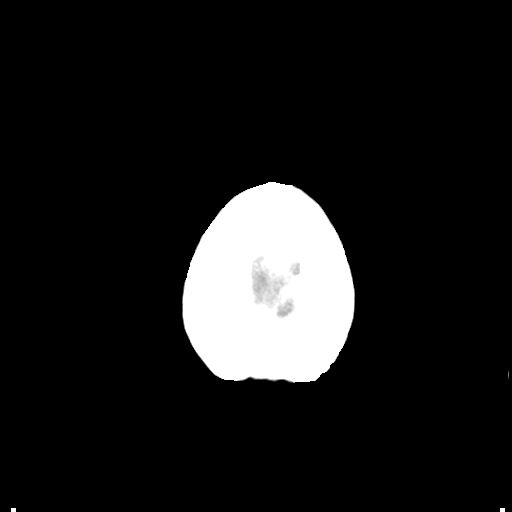

[Series 5: head bone · axial · 0.46mm/px · z∈[+132,+164]mm · 3 of 84 slices shown]
[im 9/84  bone]
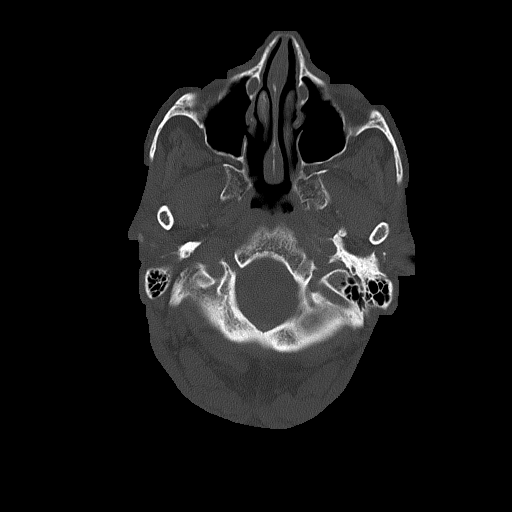
[im 17/84  bone]
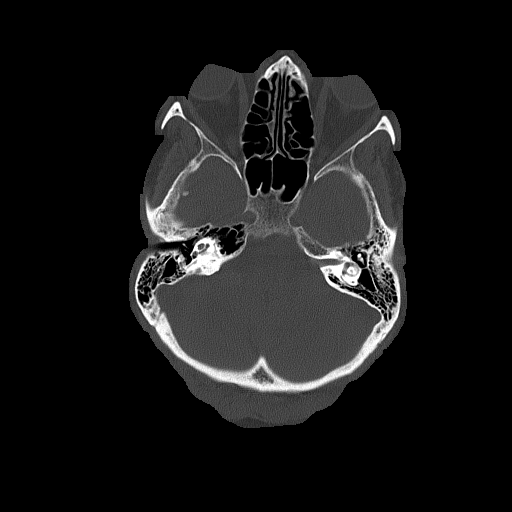
[im 25/84  bone]
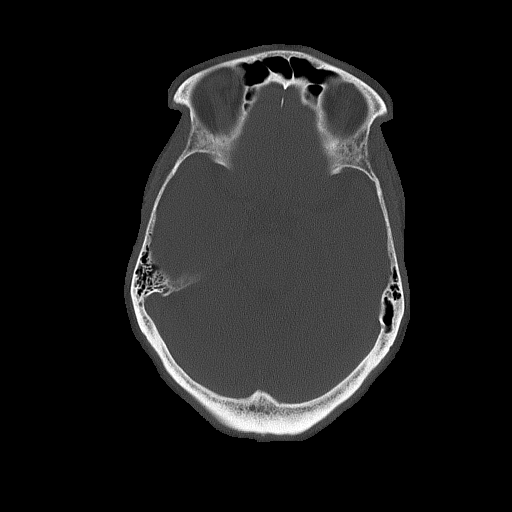

[Series 6: head without cor · coronal · non-contrast · 0.33mm/px · 3 of 72 slices shown]
[im 24/72  brain]
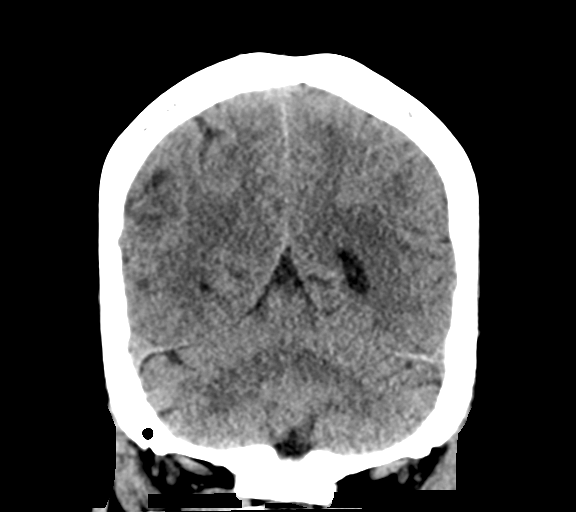
[im 32/72  brain]
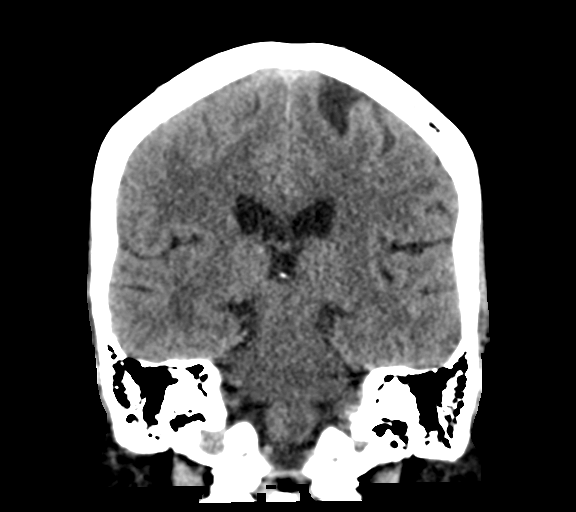
[im 40/72  brain]
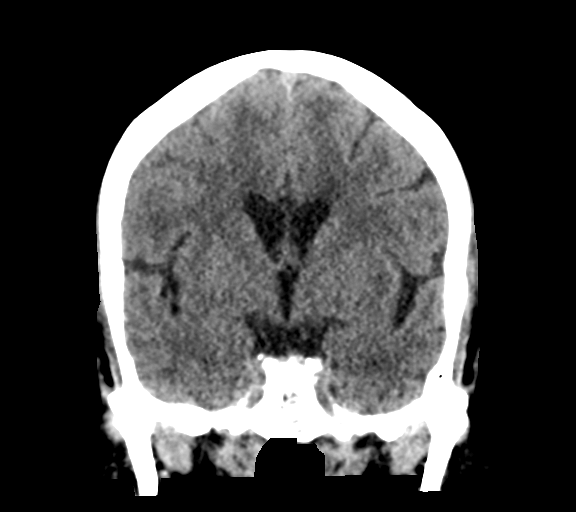

[Series 7: head without sag · sagittal · non-contrast · 0.33mm/px · 3 of 54 slices shown]
[im 18/54  brain]
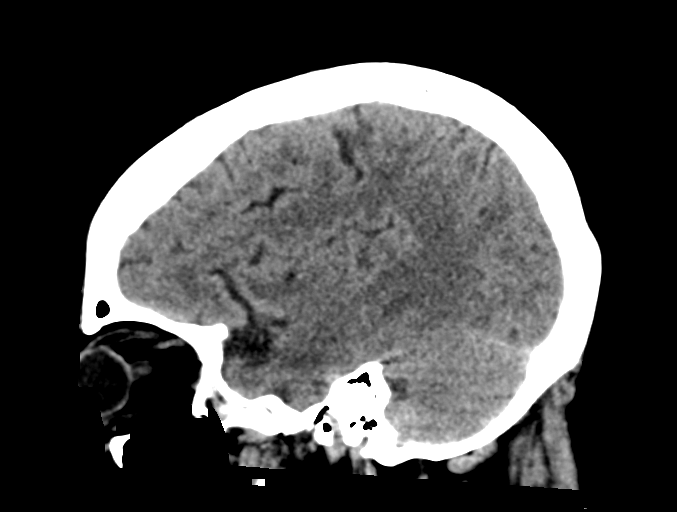
[im 27/54  brain]
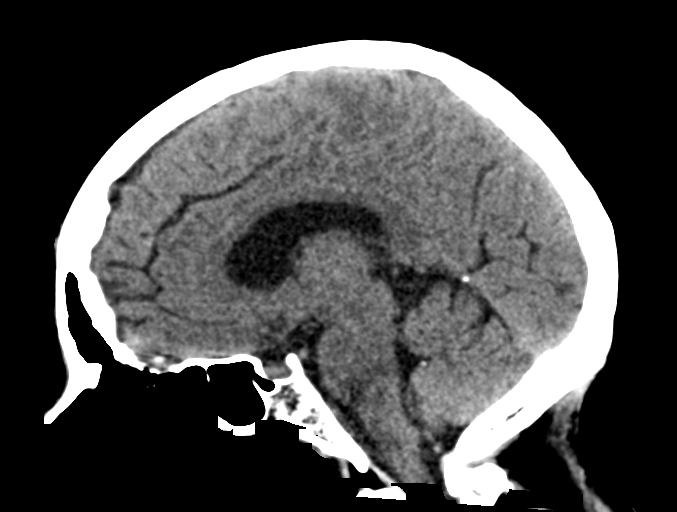
[im 36/54  brain]
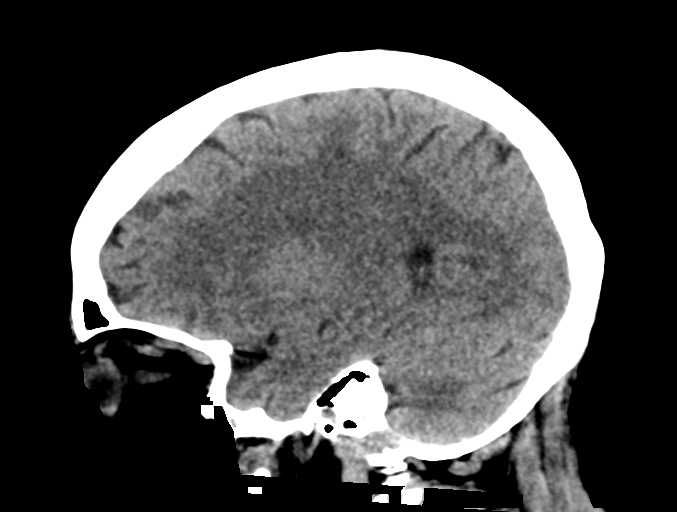

[16 of 47 positions shown; findings below may reference images not displayed]

FINDINGS: Brain: Age advanced cerebral atrophy. Moderate and age advanced low
density in the periventricular white matter likely related to small
vessel disease. No mass lesion, hemorrhage, hydrocephalus, acute
infarct, intra-axial, or extra-axial fluid collection.

Vascular: Intracranial atherosclerosis.

Skull: Normal

Sinuses/Orbits: Normal imaged portions of the orbits and globes.
Clear paranasal sinuses and mastoid air cells. Aerated right petrous
apex.

Other: None.
IMPRESSION: 1. No acute intracranial abnormality.
2. Cerebral atrophy and small vessel ischemic change.

## 2022-10-01 IMAGING — DX DG ABD PORTABLE 1V
1 series · 1 of 1 positions shown · non-contrast
Comparison: CT abdomen pelvis dated August 29, 2020.

CLINICAL DATA: Abdominal distension with nausea and vomiting.

EXAM:
PORTABLE ABDOMEN - 1 VIEW

[abdomen supine]
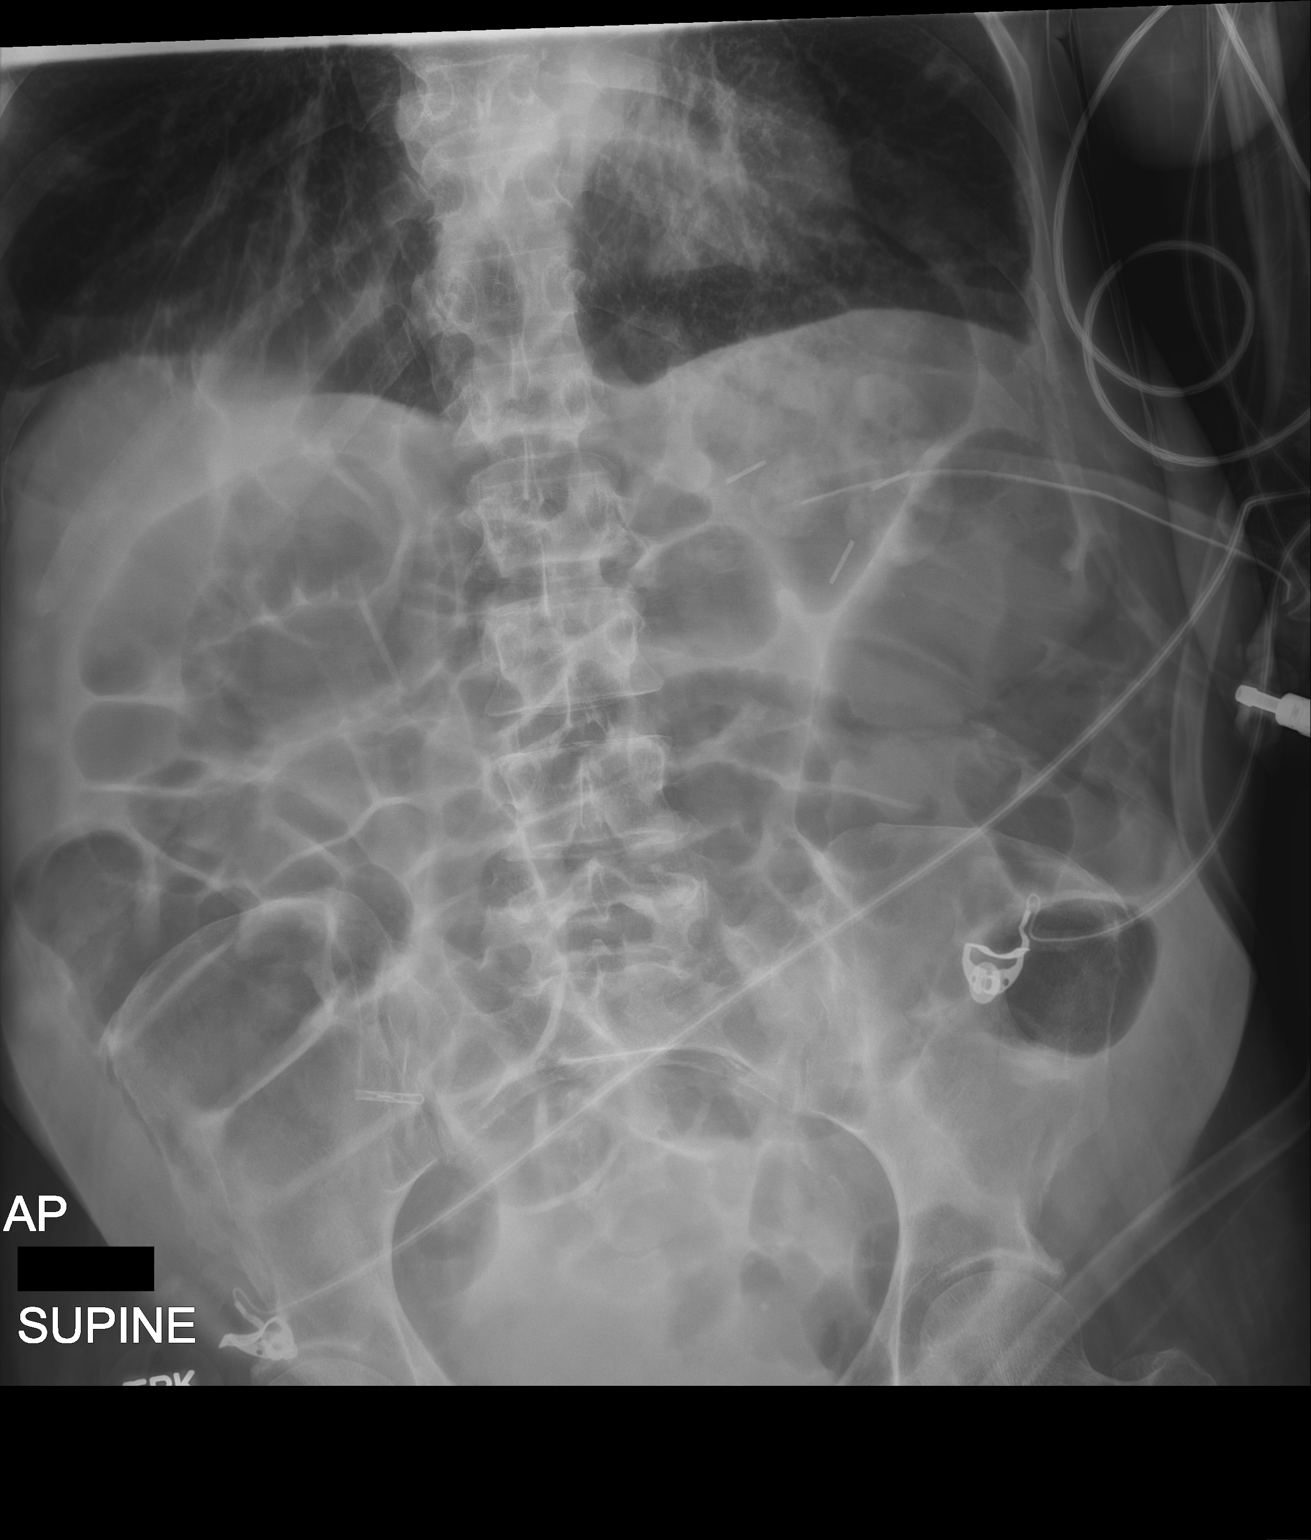

[1 of 1 positions shown; findings below may reference images not displayed]

FINDINGS: Percutaneous gastrostomy tube again noted. Progressive diffuse
gaseous distention of small bowel and colon. No radio-opaque calculi
or other significant radiographic abnormality are seen.
IMPRESSION: 1. Findings suggestive of ileus.

## 2022-10-03 IMAGING — CT CT ABD-PELV W/O CM
2 of 4 series · 17 of 46 positions shown, 19 images · non-contrast
Comparison: 08/29/2020

CLINICAL DATA: Abdominal distension

EXAM:
CT ABDOMEN AND PELVIS WITHOUT CONTRAST
TECHNIQUE: Multidetector CT imaging of the abdomen and pelvis was performed
following the standard protocol without IV contrast.

[Series 3: a/p w/o 5mm · axial · non-contrast · 0.83mm/px · z∈[-509,-199]mm · 14 of 68 slices shown, 16 images]
[im 3/68  soft-tissue]
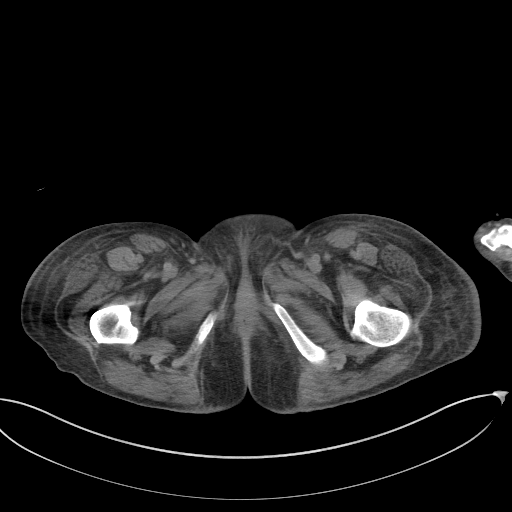
[im 3/68  bone]
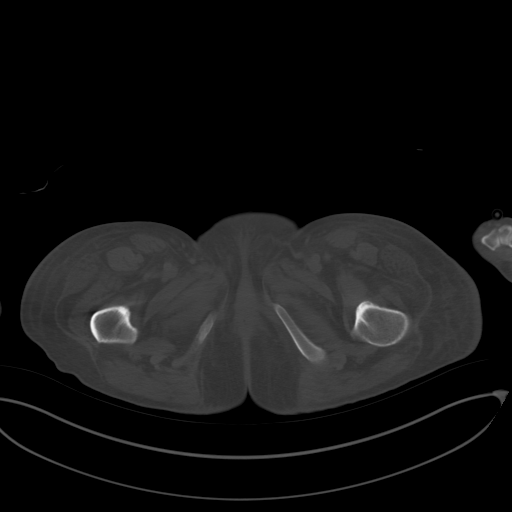
[im 8/68  soft-tissue]
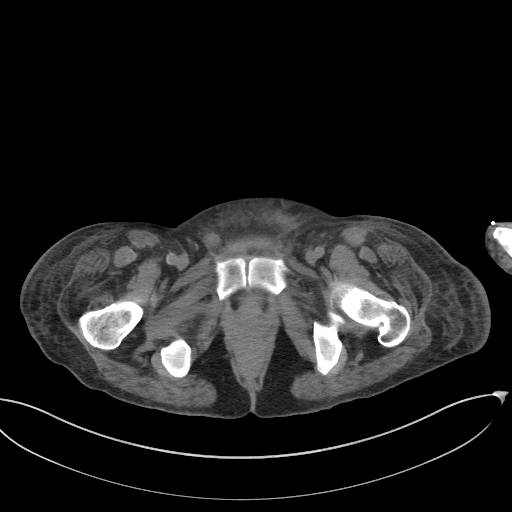
[im 13/68  soft-tissue]
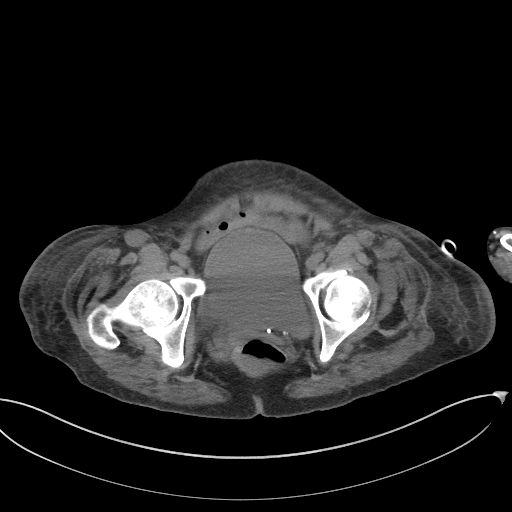
[im 19/68  soft-tissue]
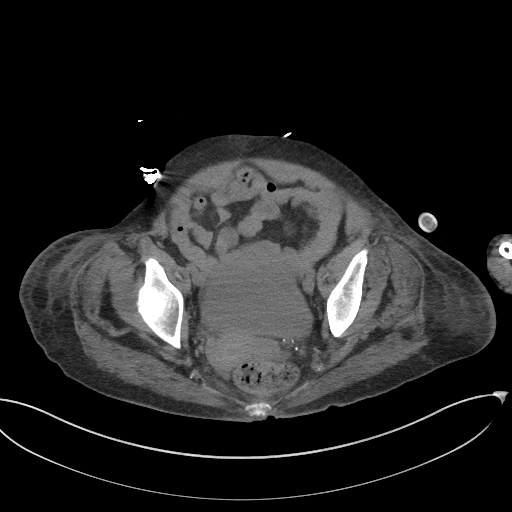
[im 24/68  soft-tissue]
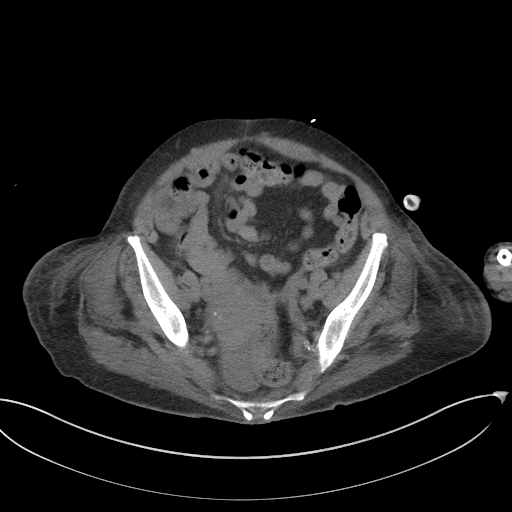
[im 26/68  soft-tissue]
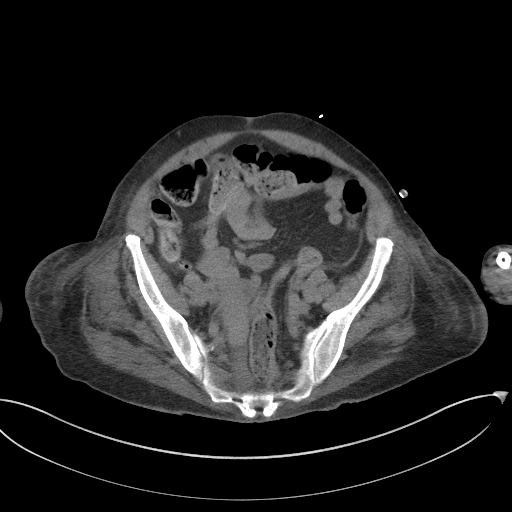
[im 31/68  soft-tissue]
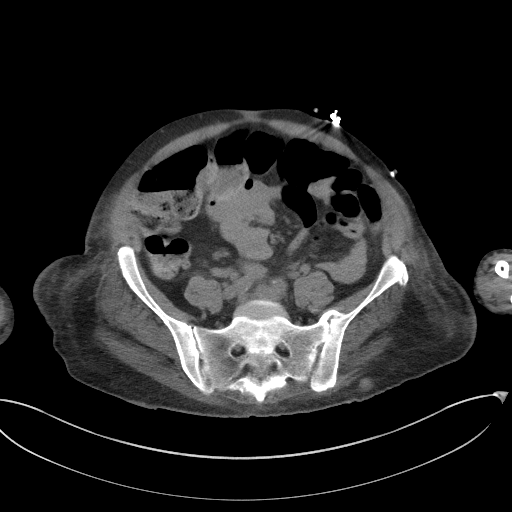
[im 37/68  soft-tissue]
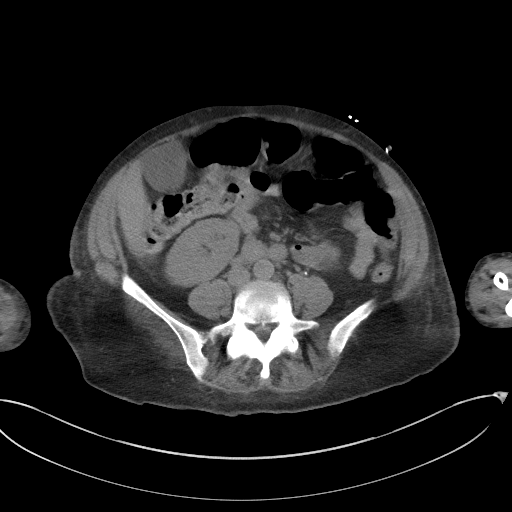
[im 42/68  soft-tissue]
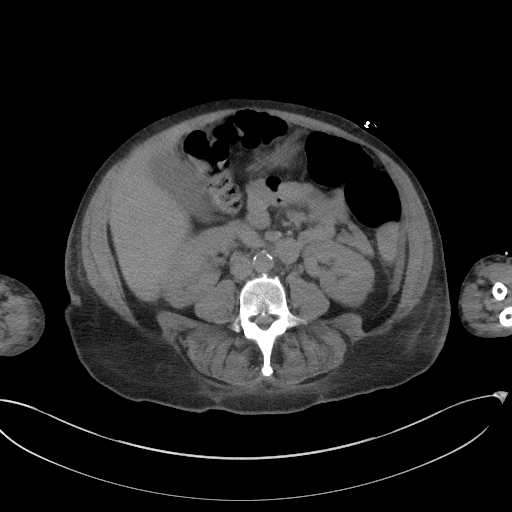
[im 42/68  bone]
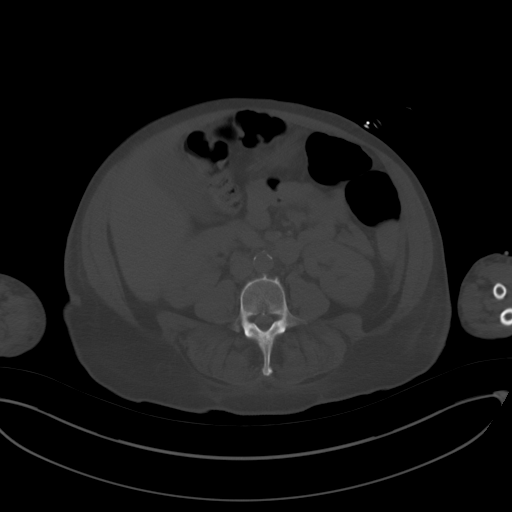
[im 44/68  soft-tissue]
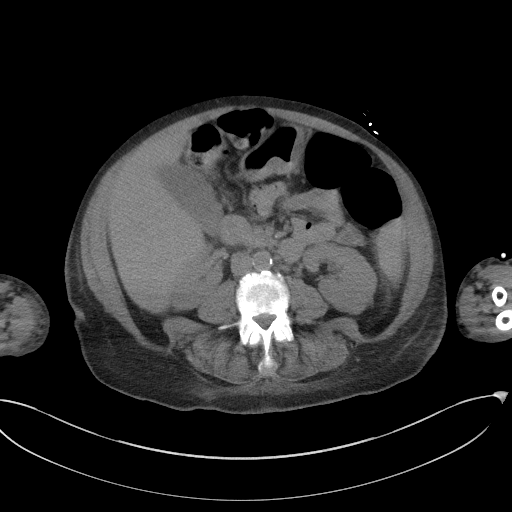
[im 49/68  soft-tissue]
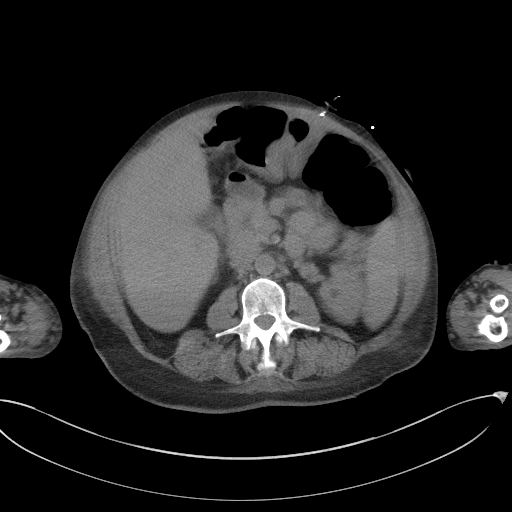
[im 55/68  soft-tissue]
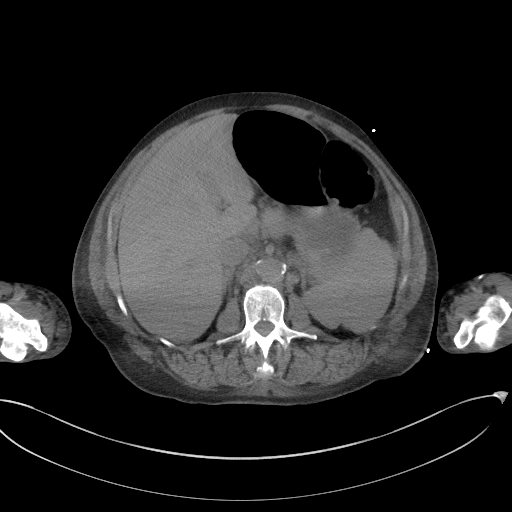
[im 60/68  soft-tissue]
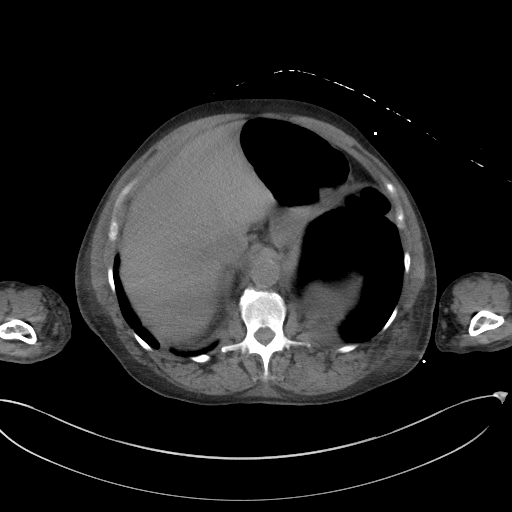
[im 65/68  soft-tissue]
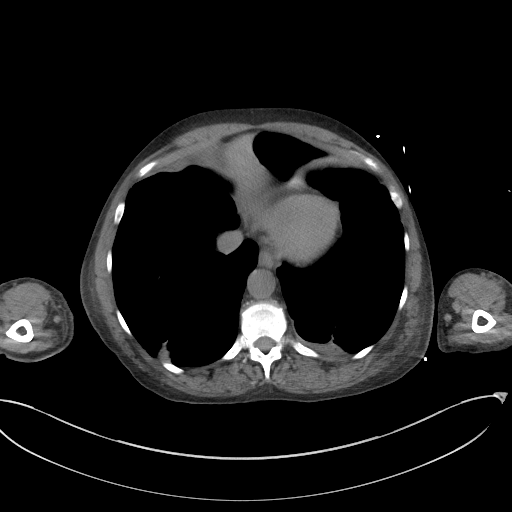

[Series 6: a/p w/o cor · coronal · non-contrast · 0.66mm/px · 3 of 151 slices shown]
[im 51/151  soft-tissue]
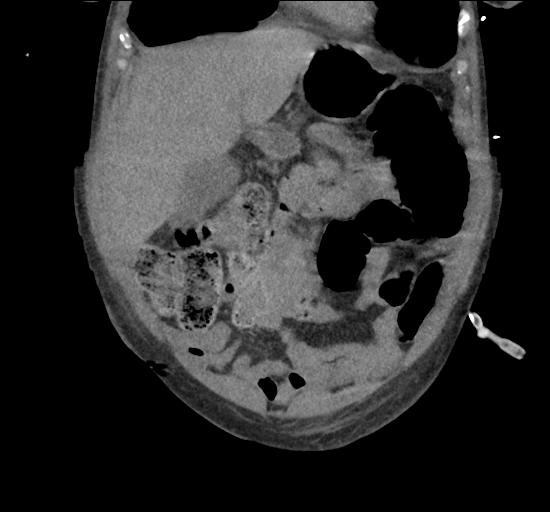
[im 67/151  soft-tissue]
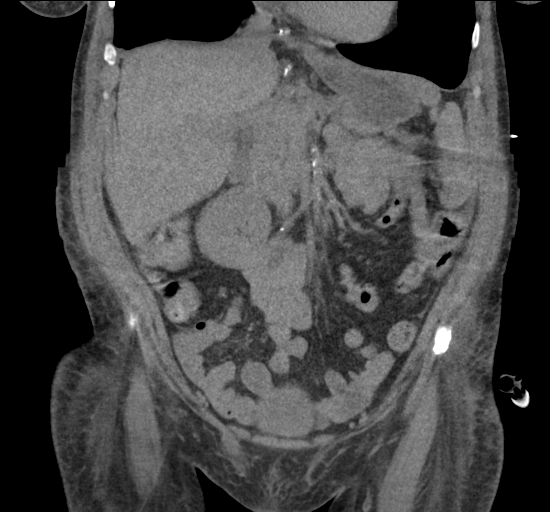
[im 84/151  soft-tissue]
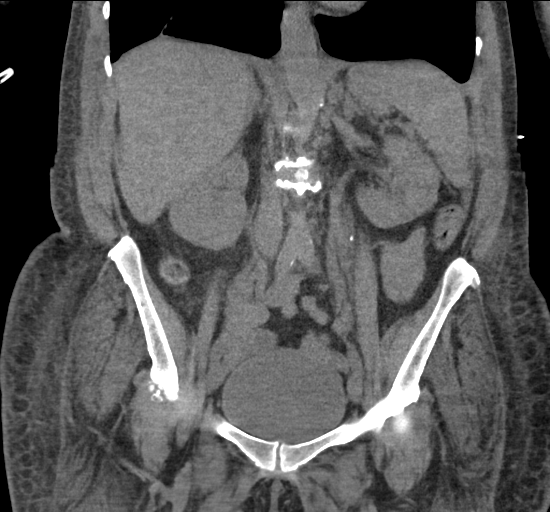

[17 of 46 positions shown; findings below may reference images not displayed]

FINDINGS: Lower chest: No acute abnormality. Bandlike scarring and or
atelectasis of the included bilateral lung bases, similar to prior
examination.

Hepatobiliary: No solid liver abnormality is seen. No gallstones,
gallbladder wall thickening, or biliary dilatation.

Pancreas: Unremarkable. No pancreatic ductal dilatation or
surrounding inflammatory changes.

Spleen: Normal in size without significant abnormality.

Adrenals/Urinary Tract: Adrenal glands are unremarkable. Kidneys are
normal, without renal calculi, solid lesion, or hydronephrosis.
Bladder is unremarkable.

Stomach/Bowel: Percutaneous gastrostomy tube, tip in the distal body
of the stomach. Appendix appears normal. No evidence of bowel wall
thickening, distention, or inflammatory changes.

Vascular/Lymphatic: Aortic atherosclerosis. No enlarged abdominal or
pelvic lymph nodes.

Reproductive: No mass or other significant abnormality.

Other: No abdominal wall hernia or abnormality. Anasarca. Small
volume free fluid in the low pelvis (series 3, image 47).

Musculoskeletal: No acute or significant osseous findings.
IMPRESSION: 1. No non-contrast CT findings of the abdomen or pelvis to explain
abdominal distension.
2. Percutaneous gastrostomy tube, tip in the distal body of the
stomach.
3. Small volume free fluid in the low pelvis, nonspecific.
4. Anasarca.

Aortic Atherosclerosis ([id number]).

## 2022-10-04 IMAGING — DX DG ABD PORTABLE 1V
1 series · 1 of 1 positions shown · non-contrast
Comparison: August 31, 2020.

CLINICAL DATA: Ileus.

EXAM:
PORTABLE ABDOMEN - 1 VIEW

[abdomen]
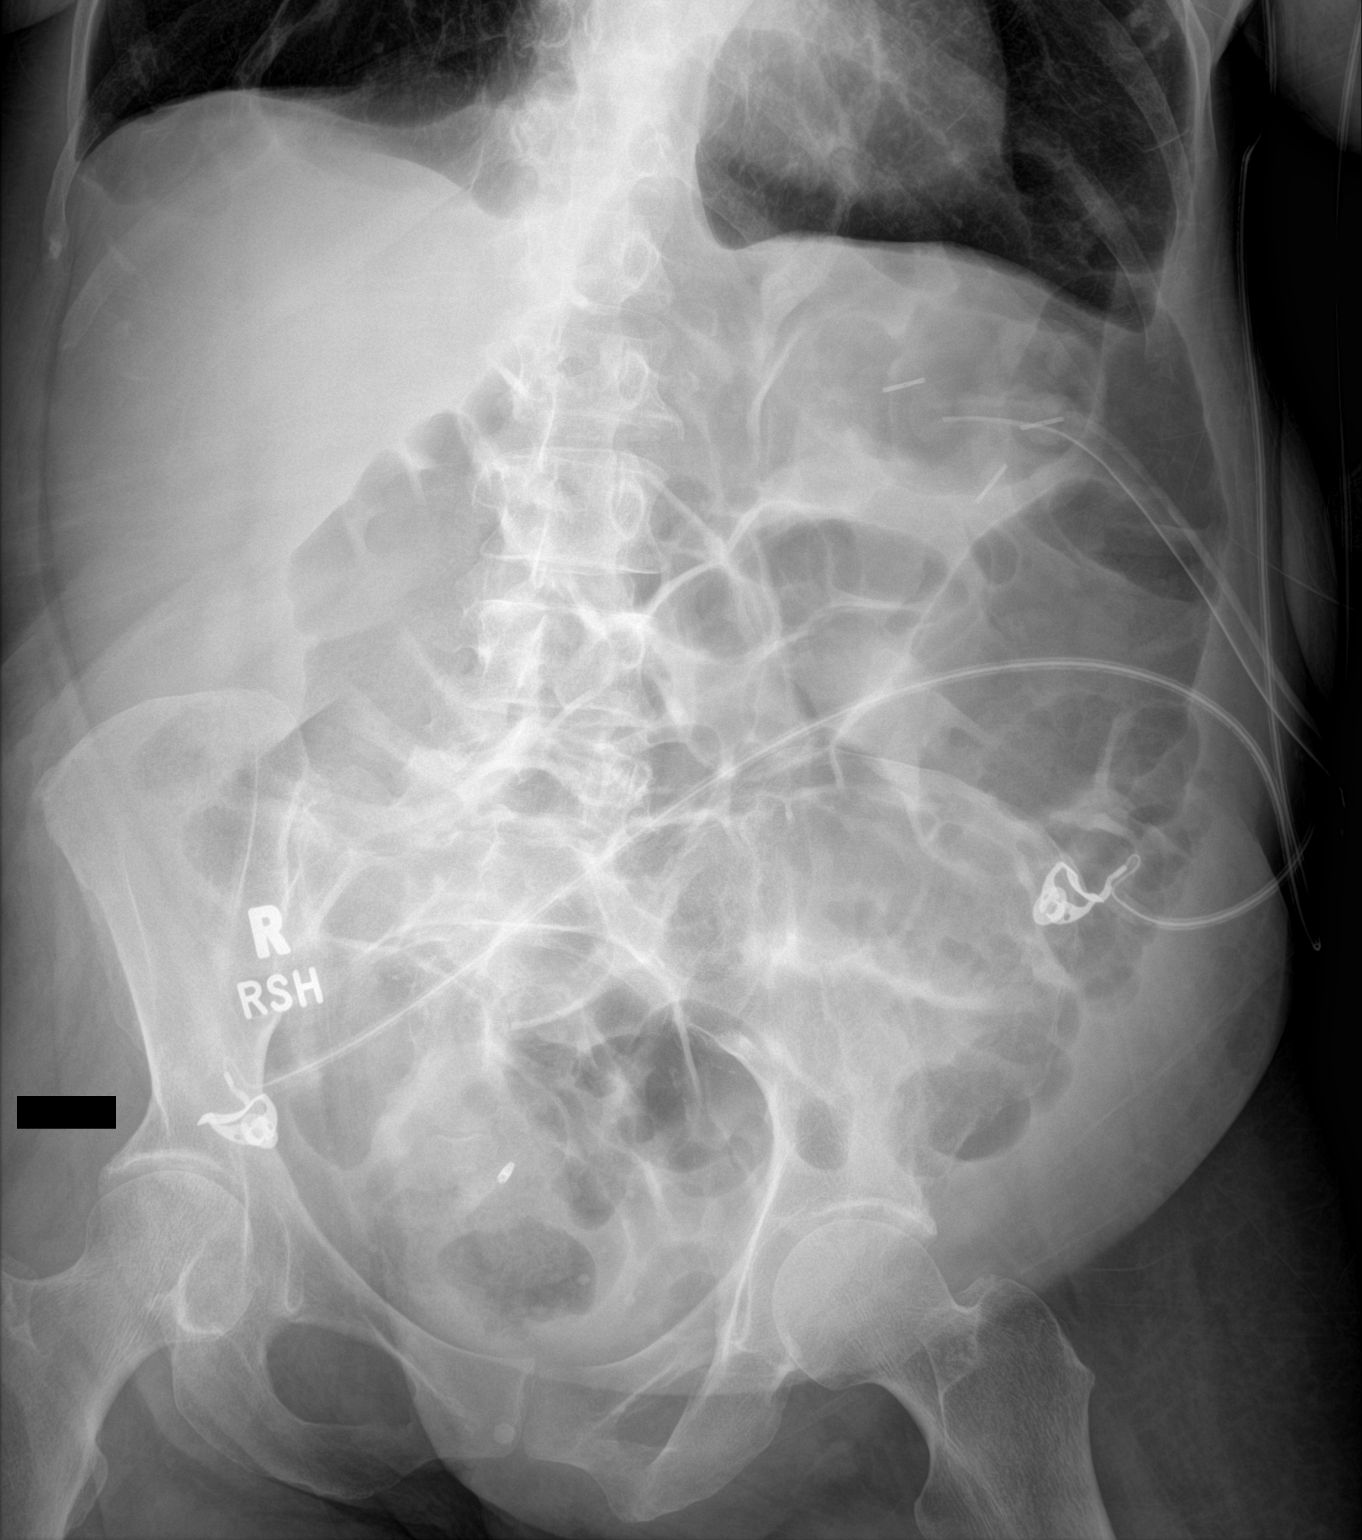

[1 of 1 positions shown; findings below may reference images not displayed]

FINDINGS: Mildly dilated and air-filled large and small bowel is noted
concerning for ileus. No radiopaque calculi are noted. Gastrostomy
tube is noted in left upper quadrant.
IMPRESSION: Stable findings concerning for ileus. Gastrostomy tube seen in left
upper quadrant.

## 2022-10-05 IMAGING — DX DG ABDOMEN 1V
1 series · 1 of 1 positions shown · non-contrast
Comparison: September 03, 2020.

CLINICAL DATA: Ileus.

EXAM:
ABDOMEN - 1 VIEW

[abdomen kub]
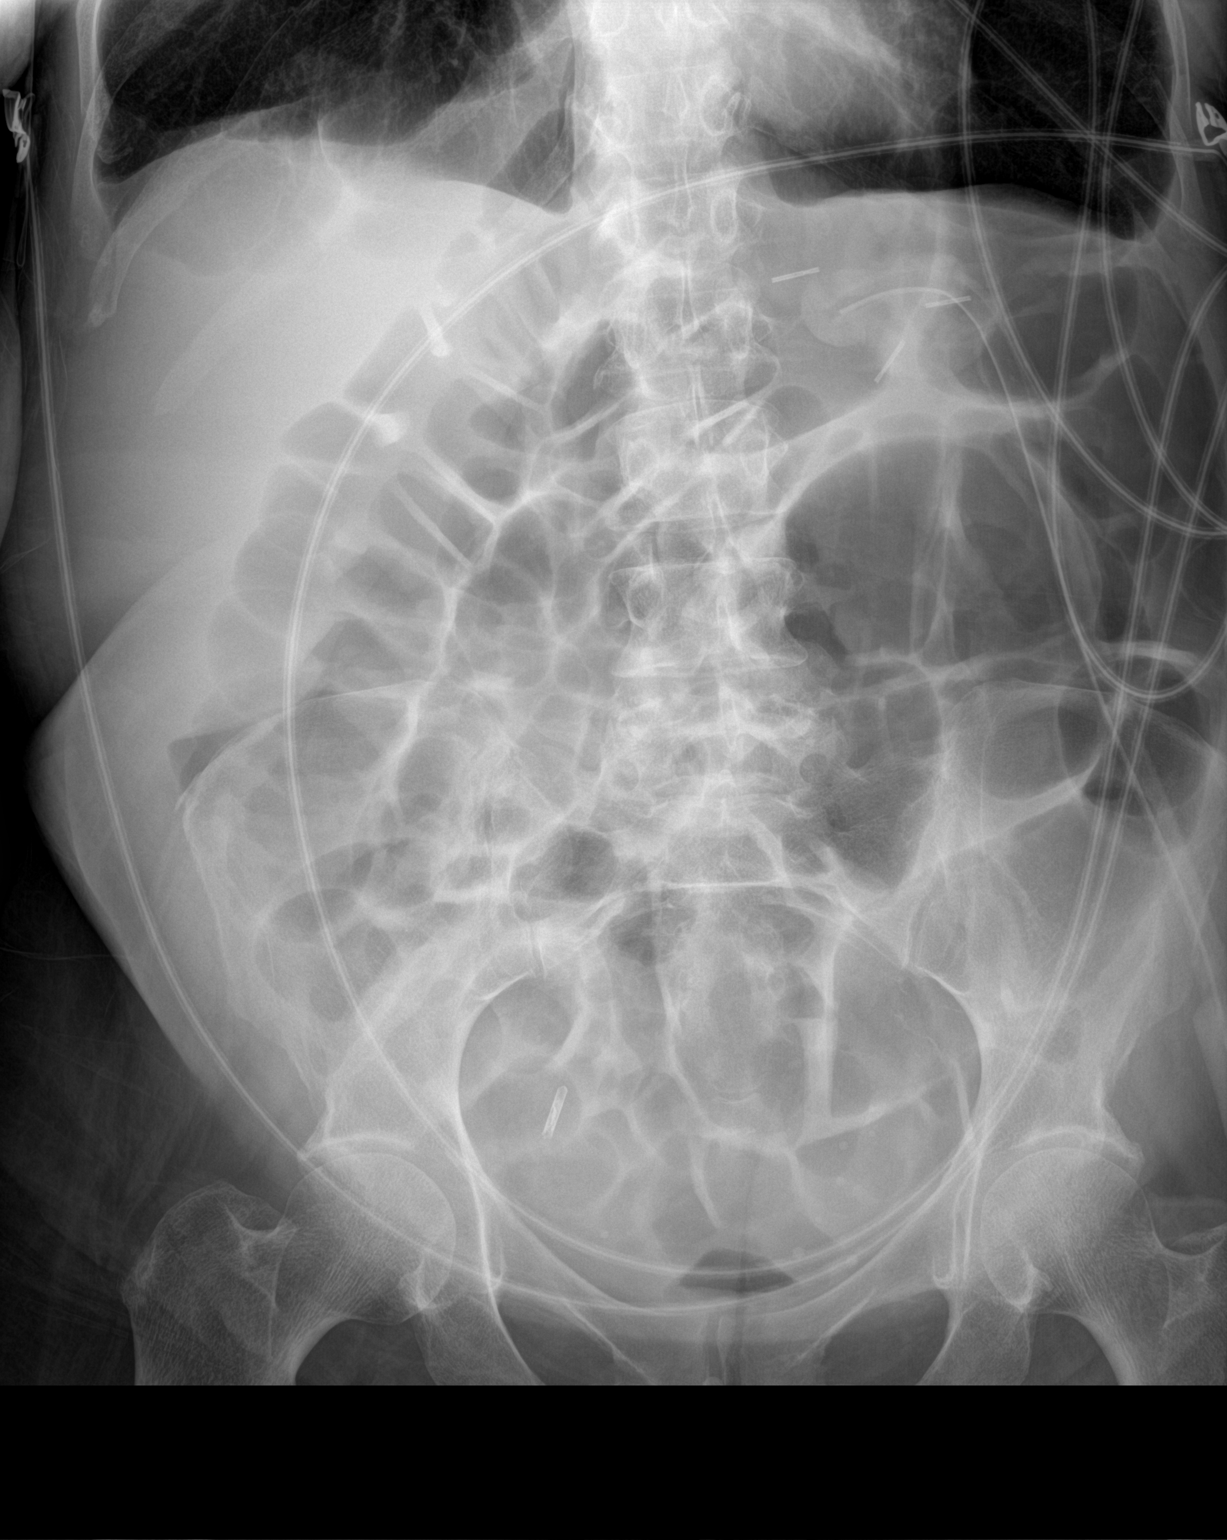

[1 of 1 positions shown; findings below may reference images not displayed]

FINDINGS: Gastrostomy tube is again noted in the left upper quadrant. Mildly
dilated large and small bowel loops are again noted suggesting
ileus. No radio-opaque calculi or other significant radiographic
abnormality are seen.
IMPRESSION: Stable findings consistent with ileus.

## 2022-10-05 IMAGING — DX DG CHEST 1V PORT
1 series · 2 of 2 positions shown · non-contrast
Comparison: Radiograph 08/29/2020, CT 02/28/2020

CLINICAL DATA: Dyspnea

EXAM:
PORTABLE CHEST 1 VIEW

[Series 1: chest · 0.14mm/px · 2 of 2 slices shown]
[im 1/2]
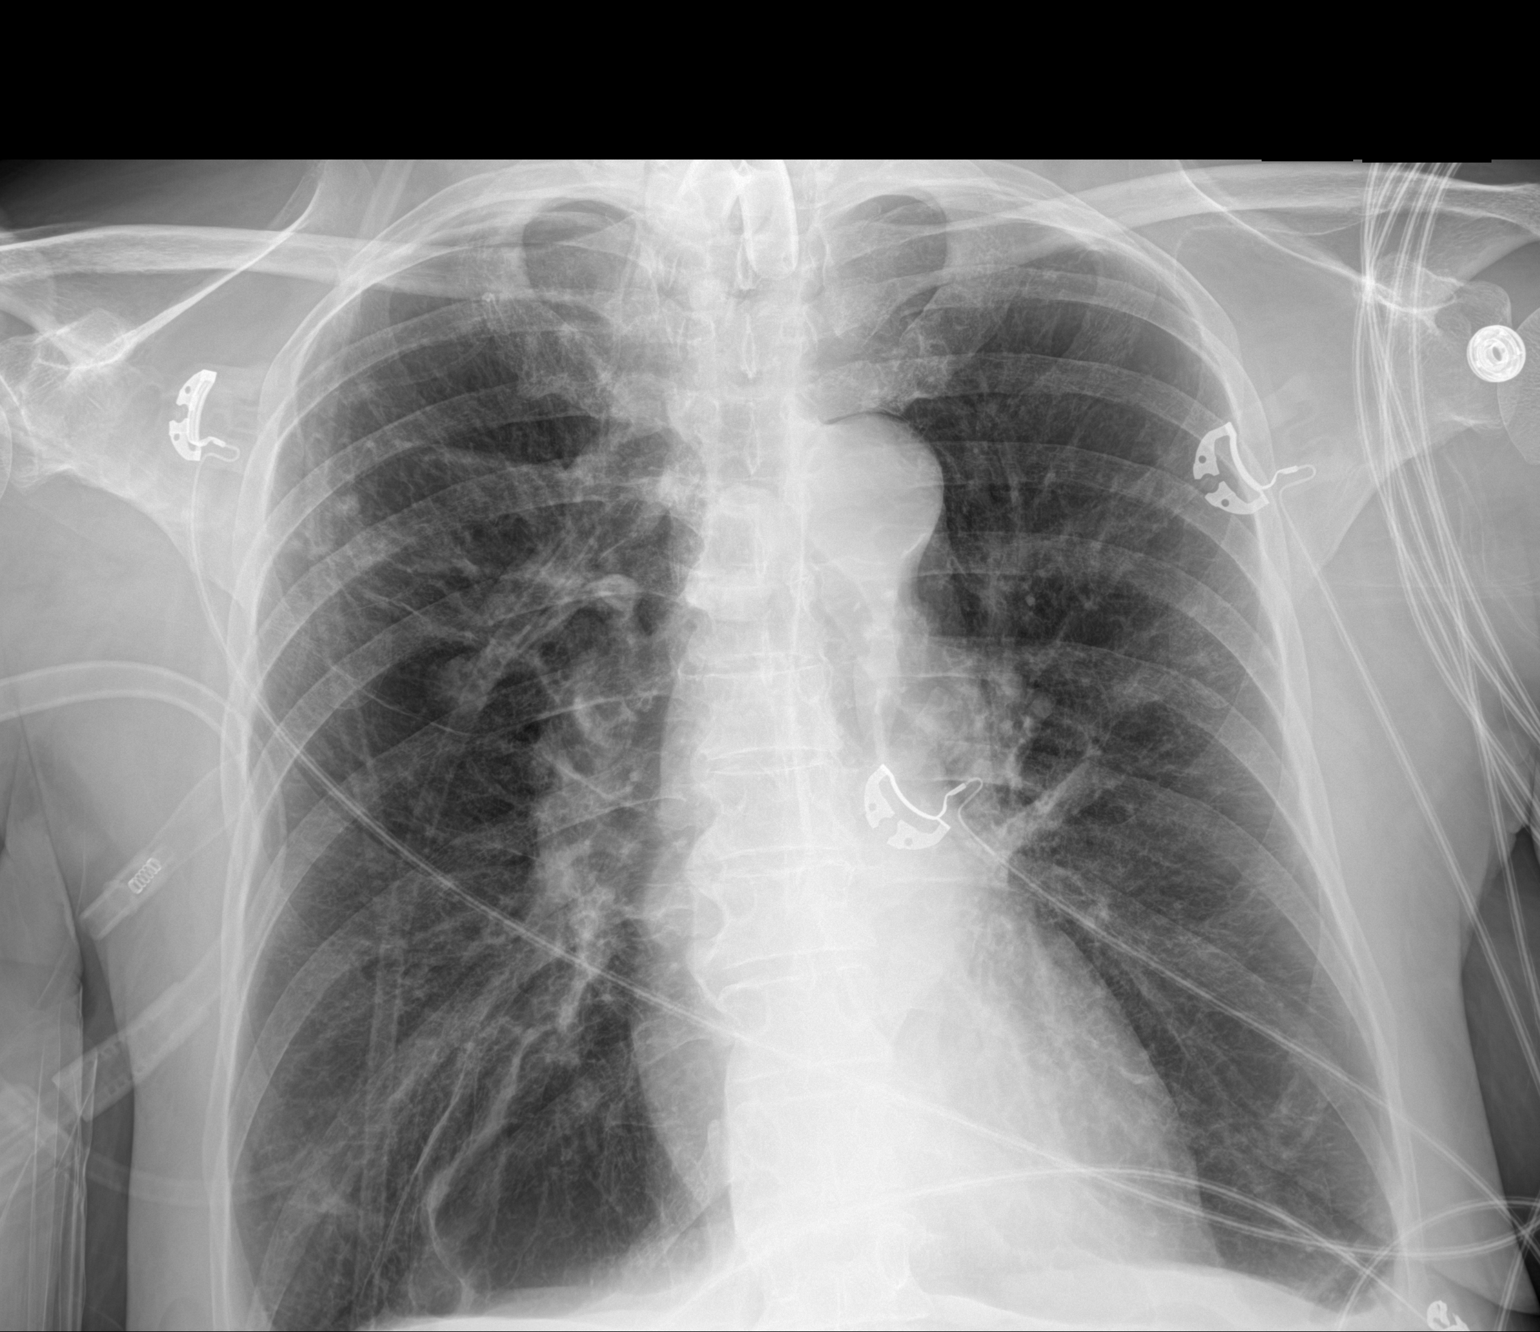
[im 2/2]
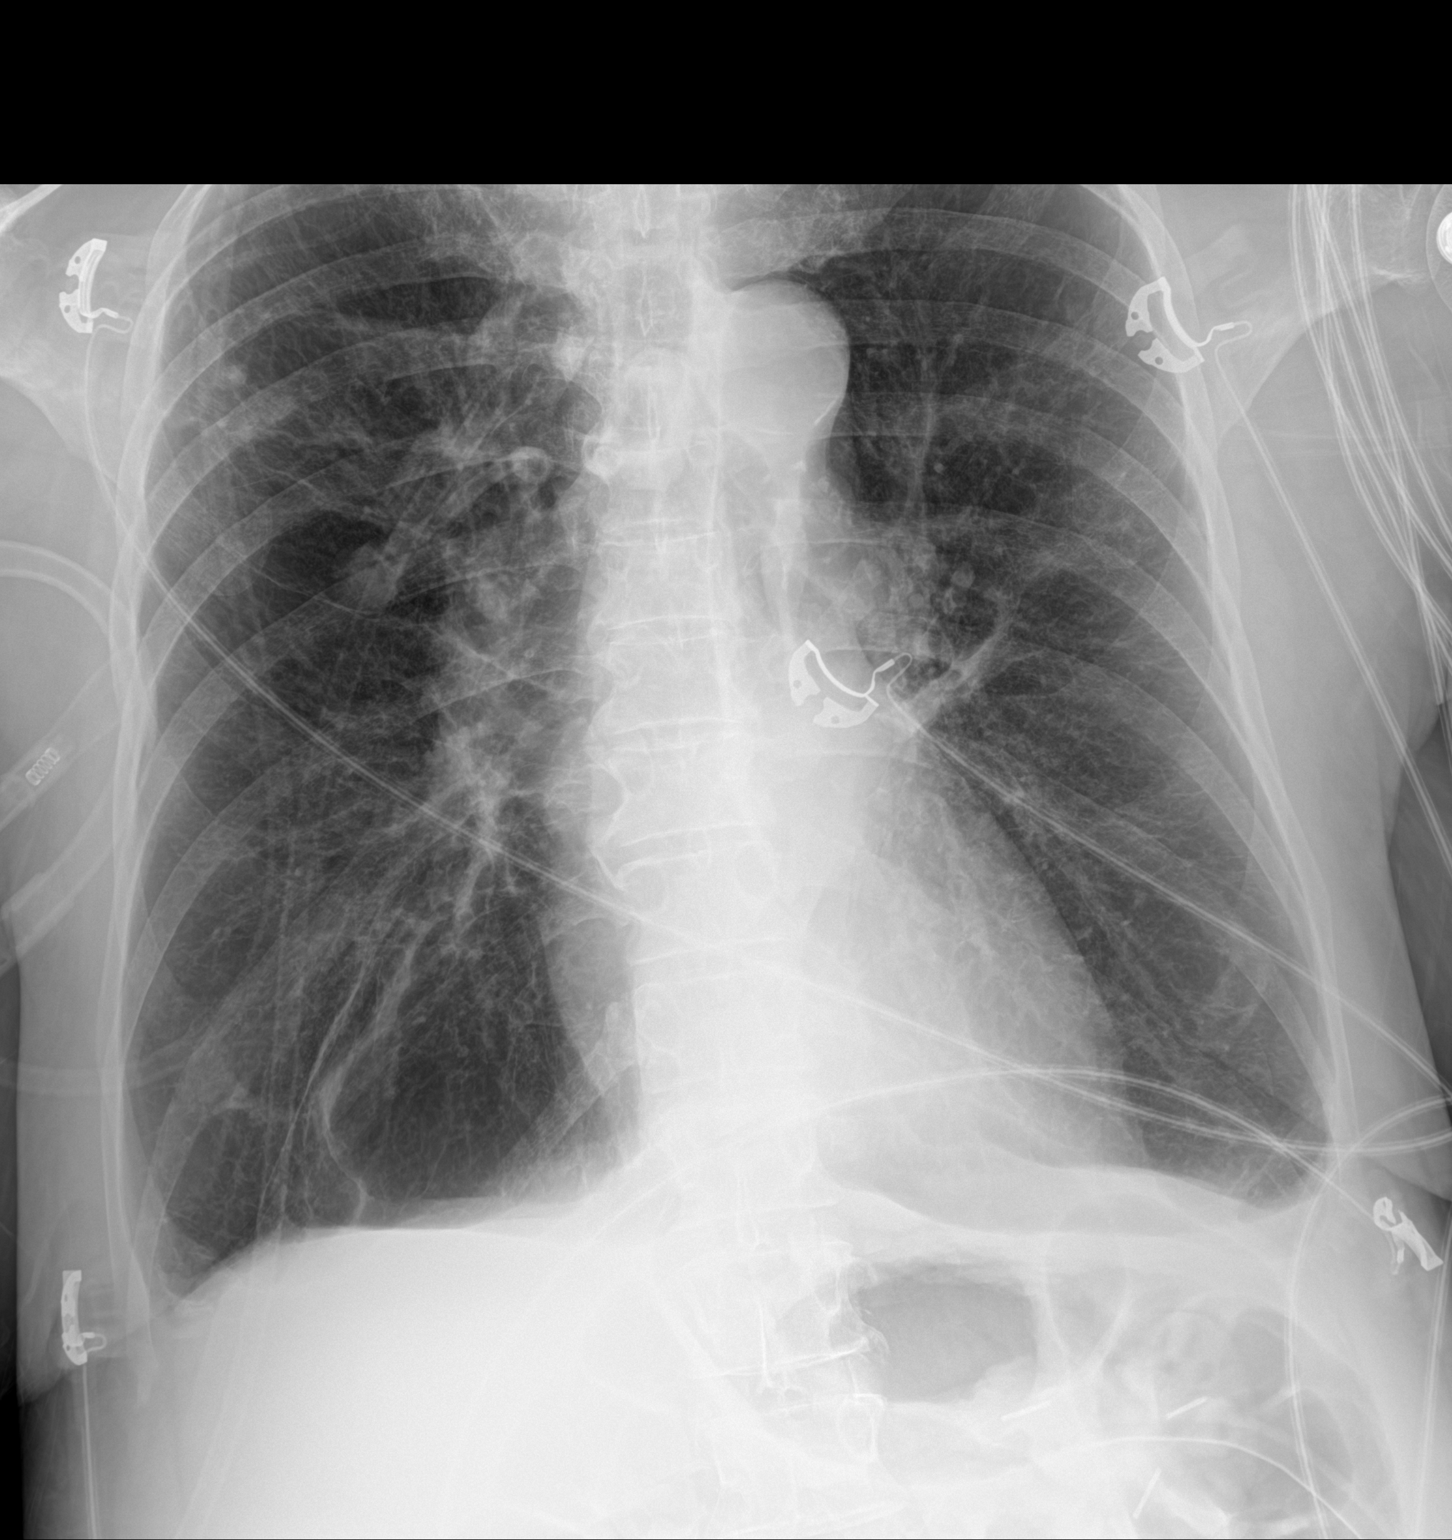

[2 of 2 positions shown; findings below may reference images not displayed]

FINDINGS: Tracheostomy tube tip terminates mid to upper trachea approximately
8 cm from the carina. Telemetry leads and external support devices
overlie the chest. Chronically coarsened interstitial changes and
regions of scarring in both lungs are similar to comparison exam.
Blunting of bilateral costophrenic sulci may reflect a combination
of hyperinflation and scarring seen on comparison images few nodular
opacities in the right upper lobe are unchanged radiographically. No
new consolidative process or convincing features of acute pulmonary
edema. No pneumothorax. Stable cardiomediastinal contours with a
calcified aorta. No acute osseous or soft tissue abnormality.
IMPRESSION: No acute cardiopulmonary abnormality.

Chronic hyperinflation, coarsened interstitial changes and scarring,
similar to prior.

Blunting of the costophrenic sulci, favor scarring and
hyperinflation as well. Effusions less favored.

Stable nodular opacities in the right upper lobe.

Aortic Atherosclerosis ([id number]).

## 2022-10-08 IMAGING — DX DG CHEST 1V PORT
2 series · 2 of 2 positions shown · non-contrast
Comparison: 09/04/2020

CLINICAL DATA: Respiratory failure

EXAM:
PORTABLE CHEST 1 VIEW

[chest ap (1 of 2)]
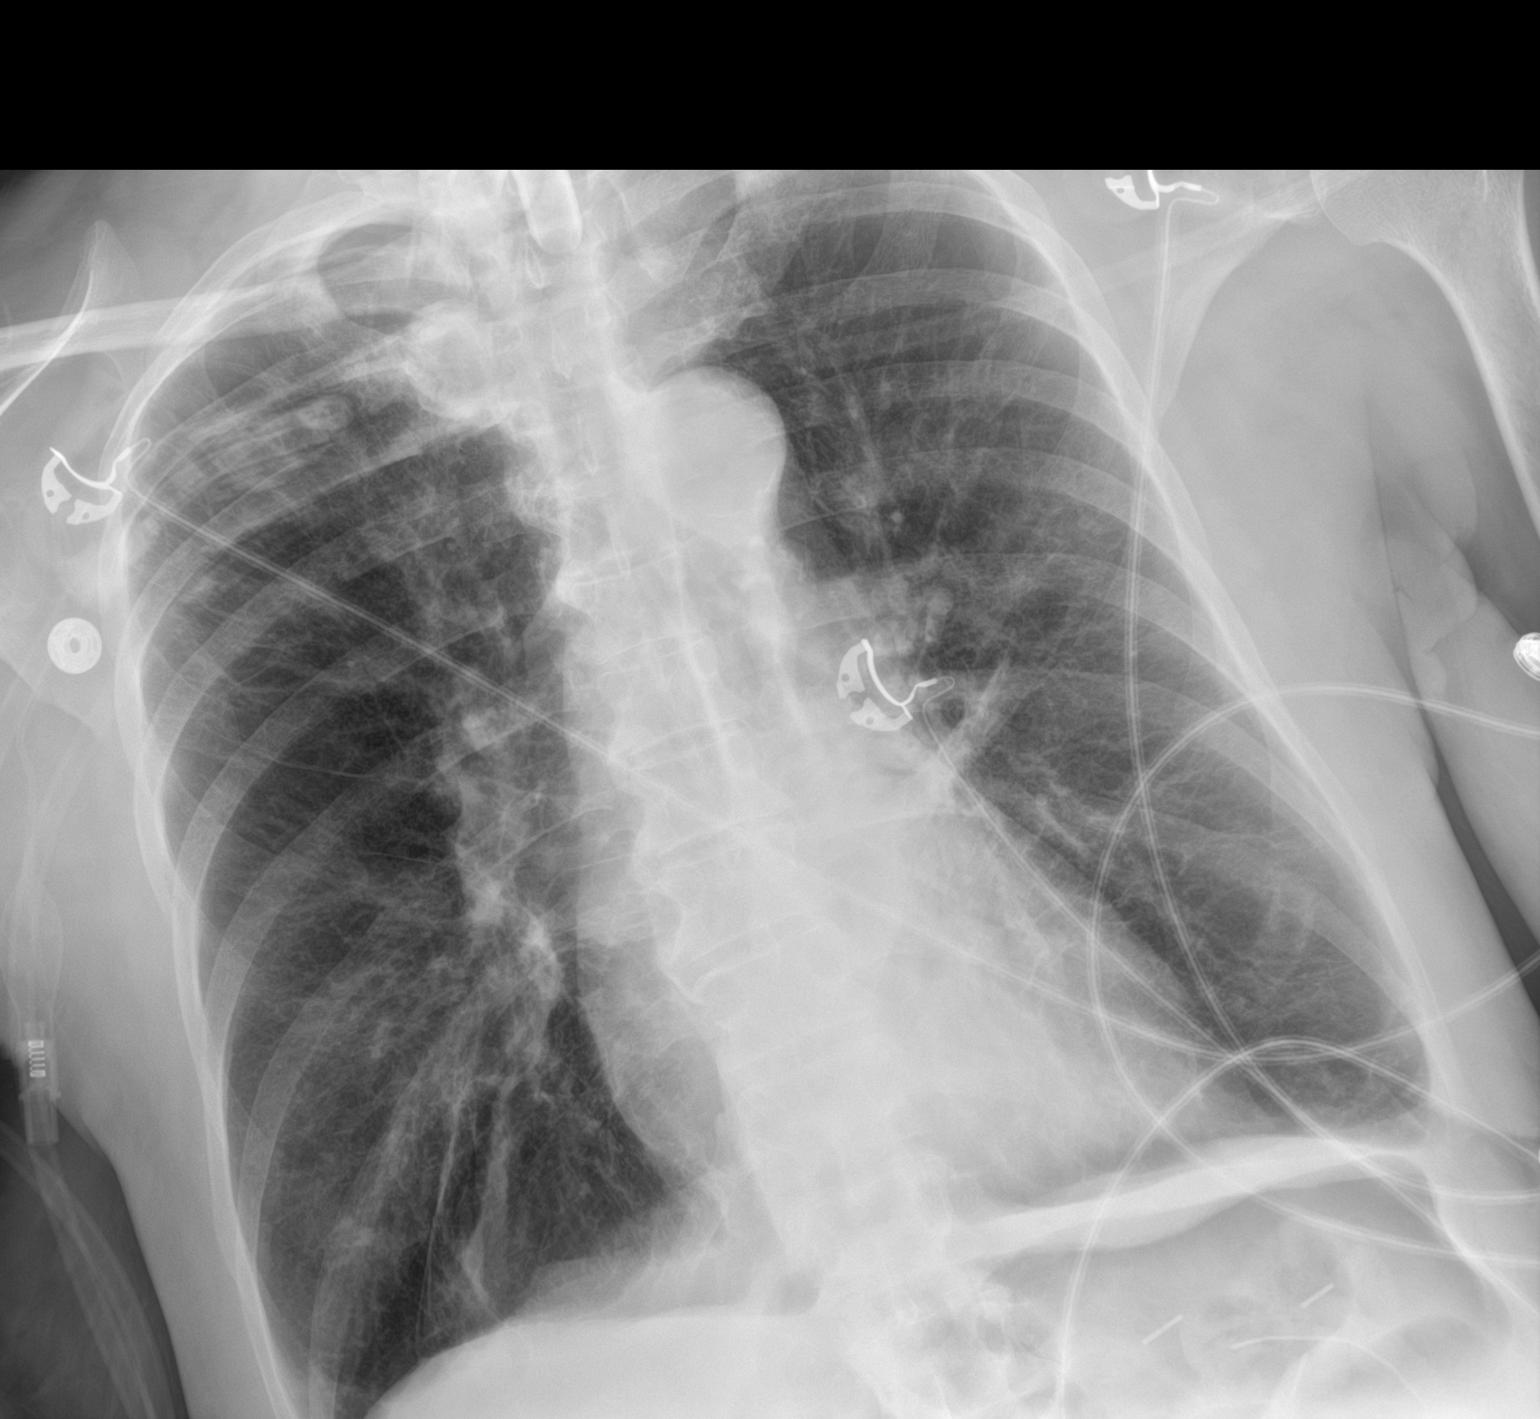

[chest ap (2 of 2)]
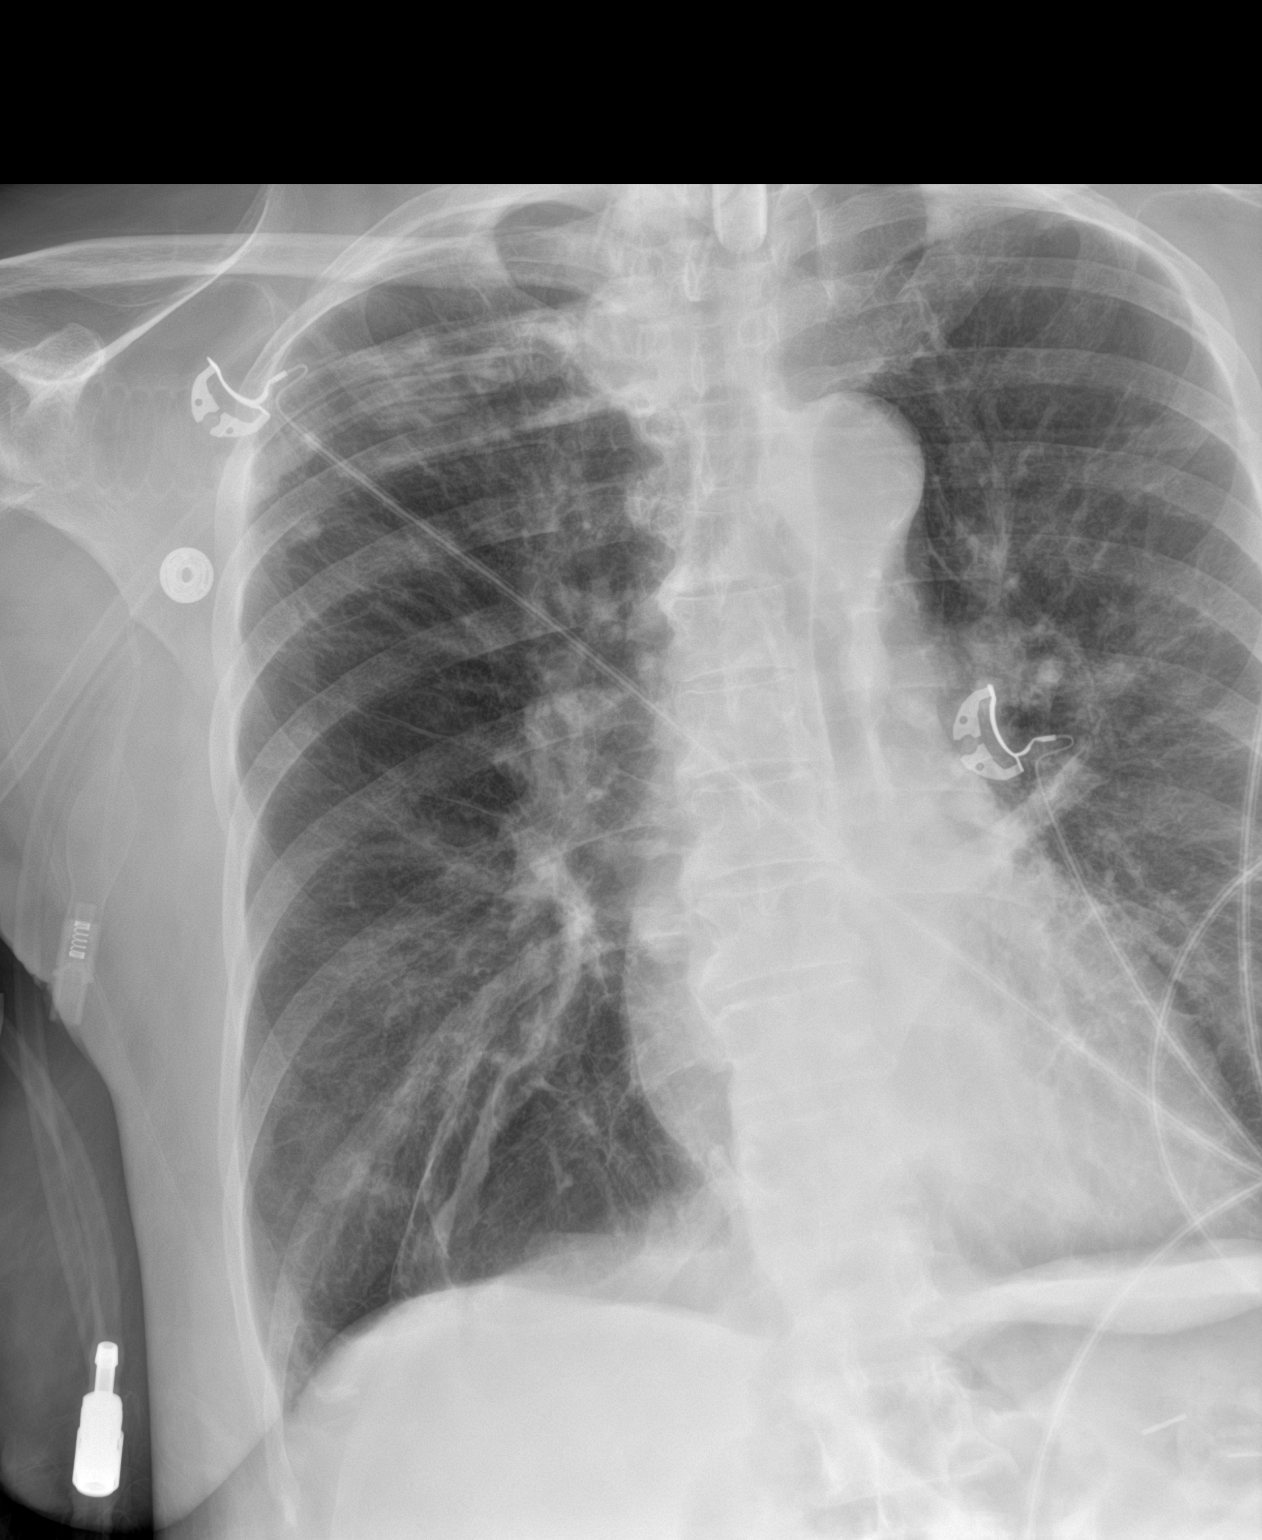

[2 of 2 positions shown; findings below may reference images not displayed]

FINDINGS: The lungs are symmetrically hyperinflated in keeping with changes of
underlying COPD. Mild right basilar scarring is again noted. No
superimposed confluent pulmonary infiltrate. No pneumothorax or
pleural effusion. Cardiac size within normal limits. Tracheostomy
unchanged. No acute bone abnormality.
IMPRESSION: Stable examination. COPD. No radiographic evidence of acute
cardiopulmonary disease.

## 2022-10-09 IMAGING — DX DG ABDOMEN 1V
1 series · 1 of 1 positions shown · non-contrast
Comparison: September 04, 2020

CLINICAL DATA: Abdominal distension. Evaluate bowel pattern. Ileus.

EXAM:
ABDOMEN - 1 VIEW

[abdomen kub]
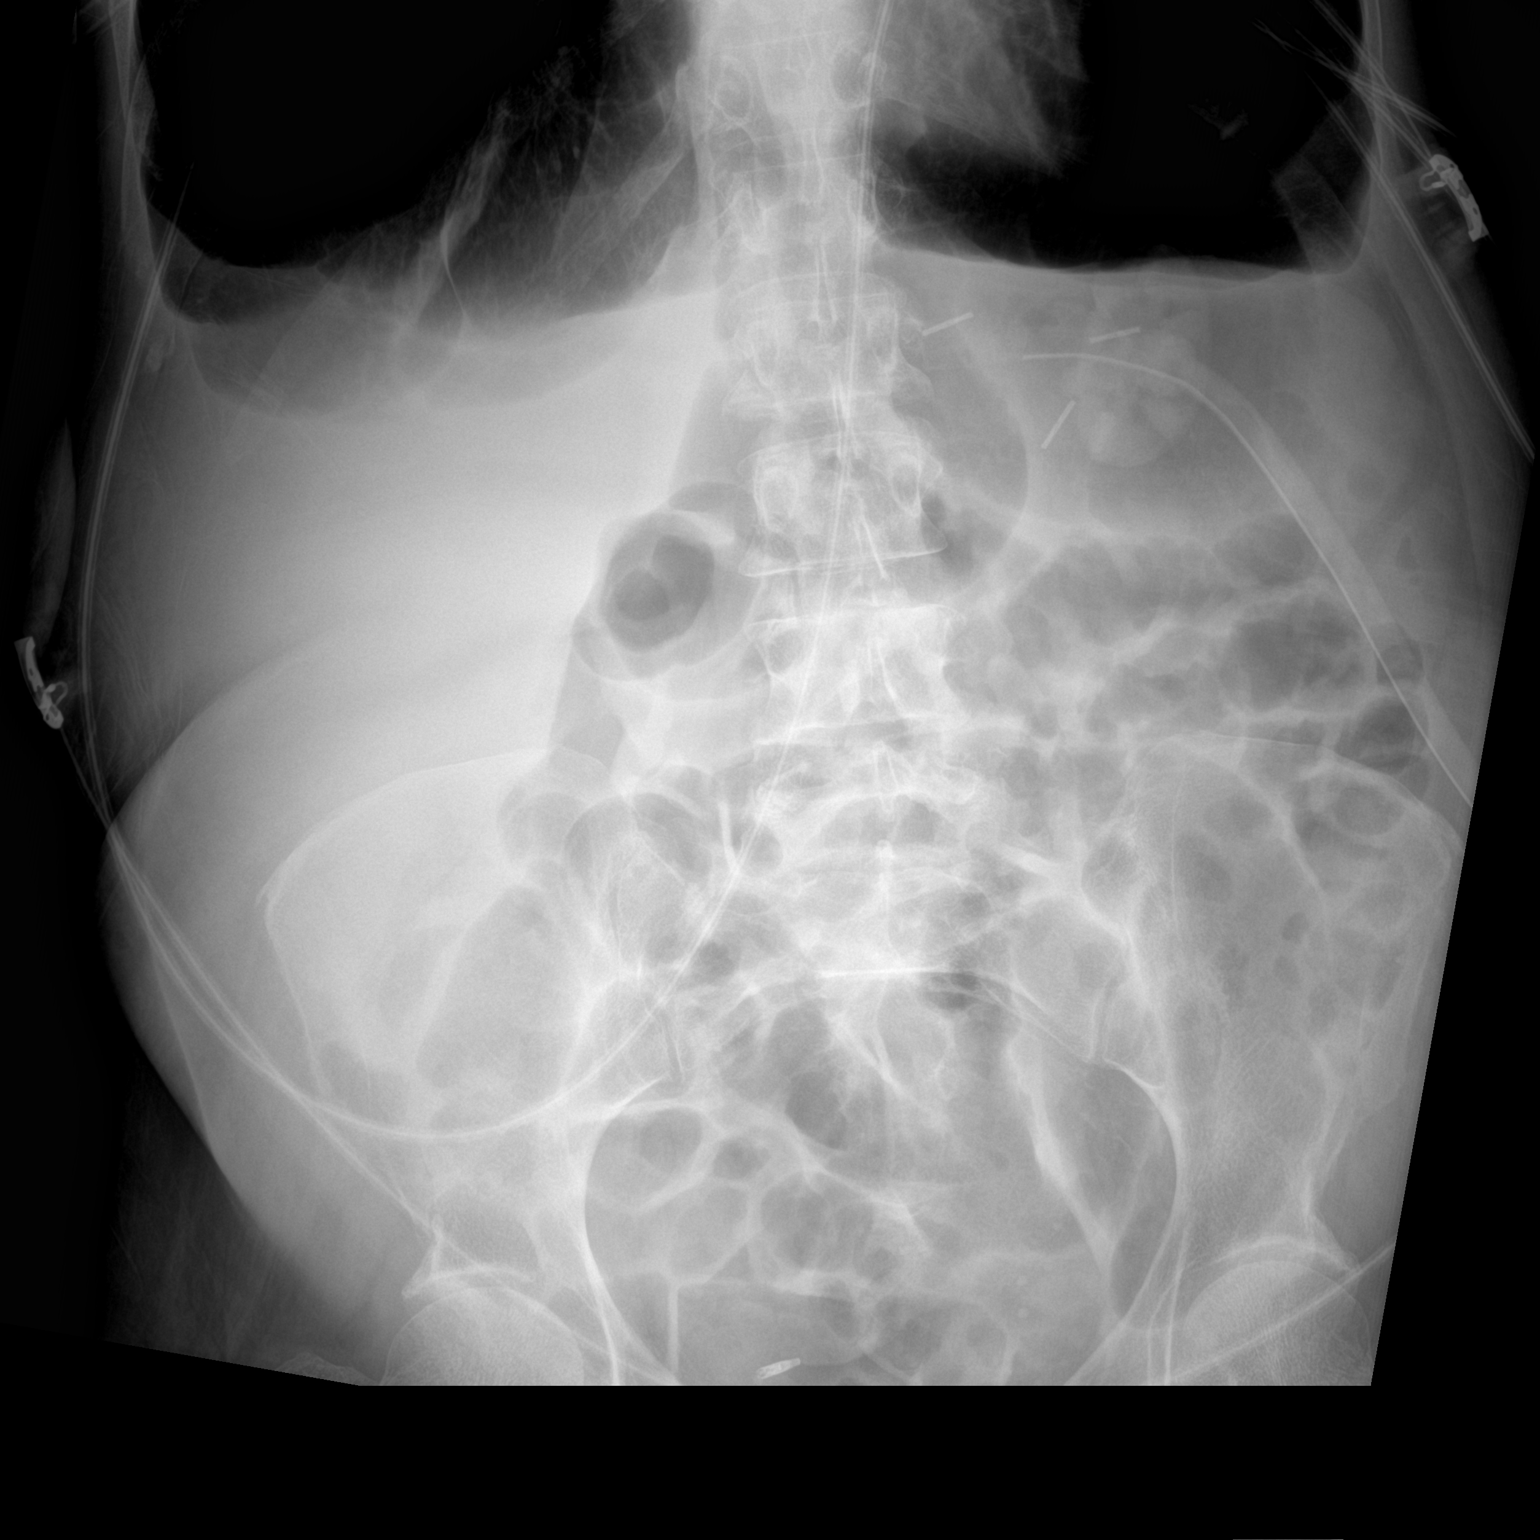

[1 of 1 positions shown; findings below may reference images not displayed]

FINDINGS: Stable PEG tube. No free air, portal venous gas, or pneumatosis.
Air-filled mildly prominent loops of bowel are identified, improved
in the interval. No other interval changes.
IMPRESSION: Improving ileus.  Recommend attention on follow-up.

## 2022-10-10 IMAGING — DX DG ABDOMEN 1V
1 series · 1 of 1 positions shown · non-contrast
Comparison: 09/08/2020

CLINICAL DATA: Gastrostomy catheter placement

EXAM:
ABDOMEN - 1 VIEW

[abdomen supine]
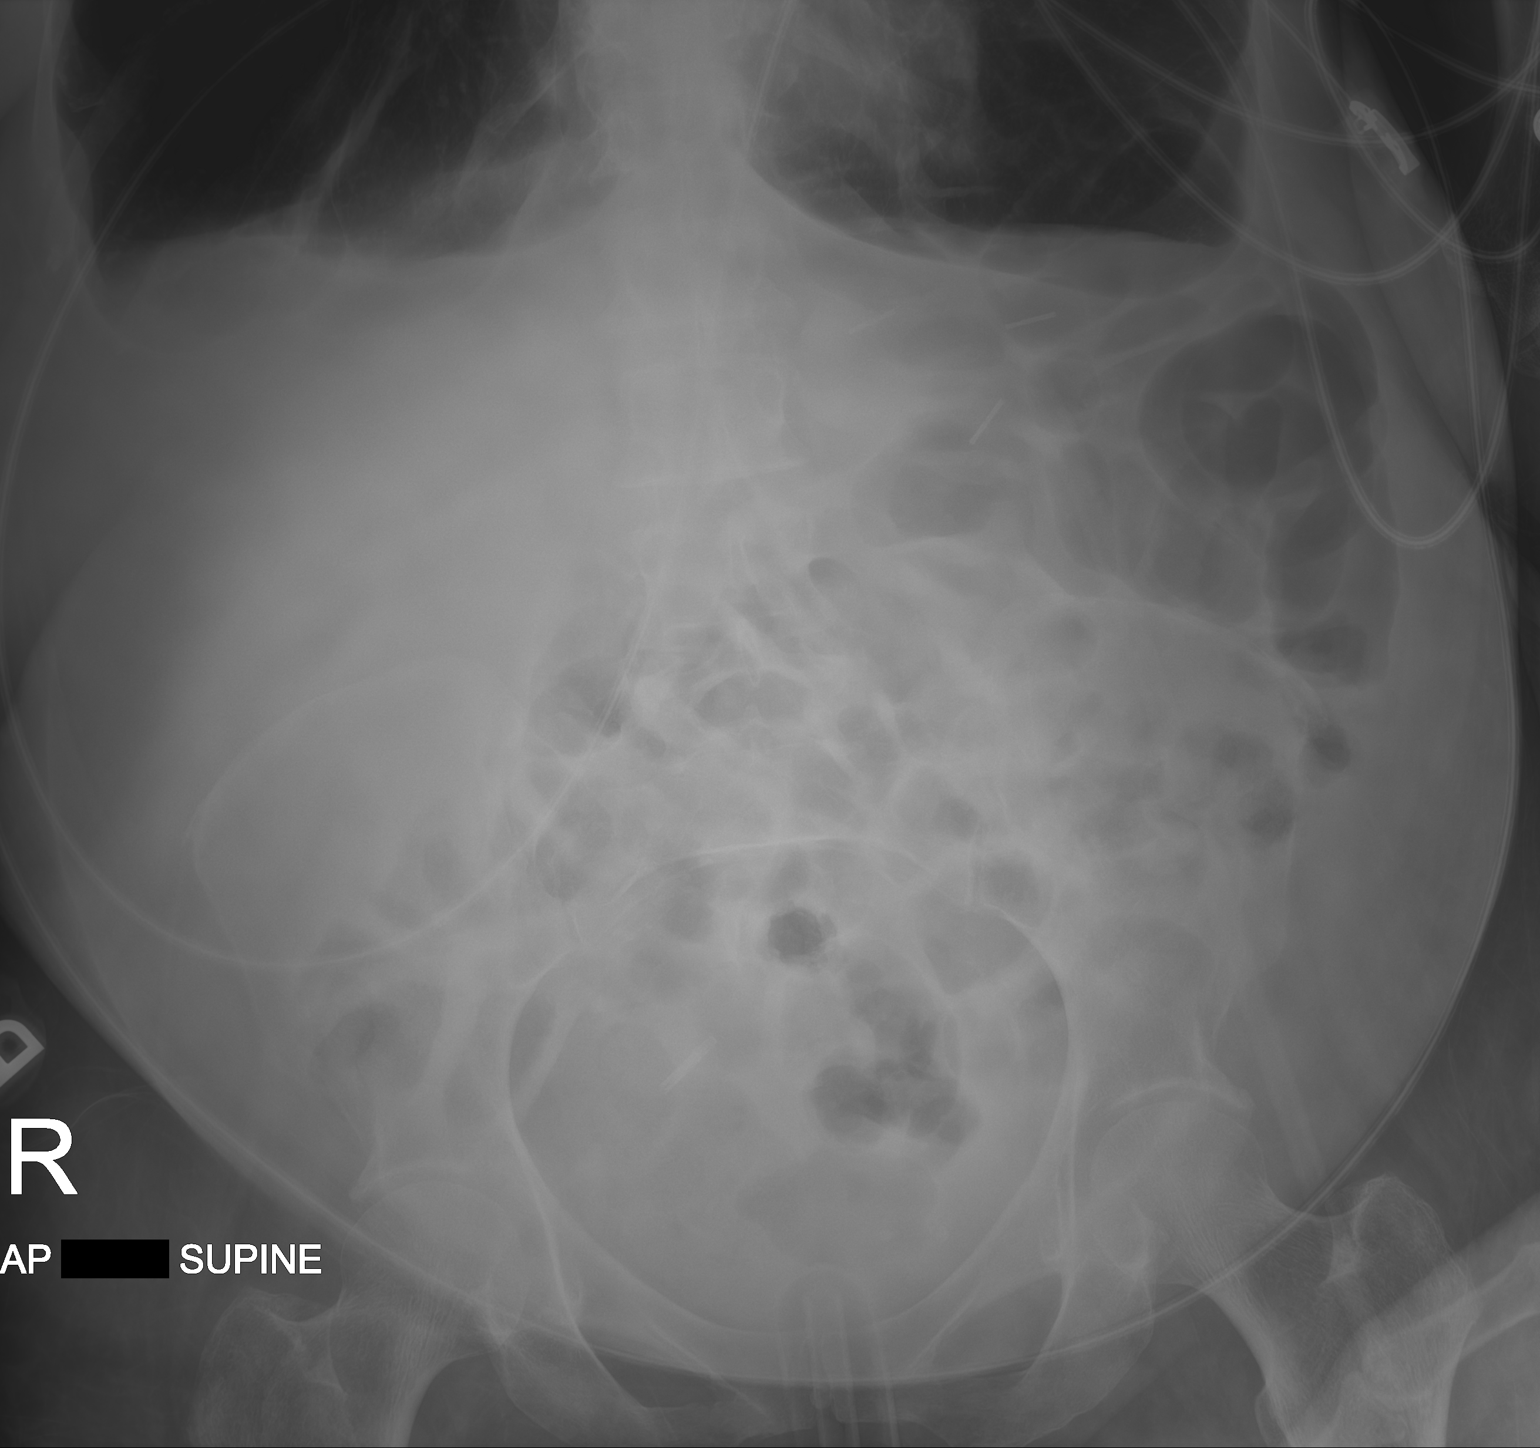

[1 of 1 positions shown; findings below may reference images not displayed]

FINDINGS: Normal abdominal gas pattern. Endoscopic clip noted within the a
right hemipelvis. Three metallic densities within the left upper
quadrant are compatible with T tacks. A probable balloon retention
catheter is seen within this region and, while overlying the
expected position, intraluminal position of the retaining balloon
cannot be determined in absence of contrast administration. No gross
free intraperitoneal gas. Small bilateral pleural effusions are
present.
IMPRESSION: Gastrostomy catheter overlying its expected position, particularly
in relation to the pre-existing T tacks. Intraluminal position of
the catheter, however, cannot be determined in absence of
administration of contrast.

## 2022-10-13 IMAGING — XA IR REPLACE G-TUBE/COLONIC TUBE
2 series · 7 of 7 positions shown · non-contrast
Comparison: none

INDICATION: 55-year-old female with nonfunctional gastric tube

[Series 2: fl angio · 3 of 3 frames shown (1 of 2)]
[frame 1/3]
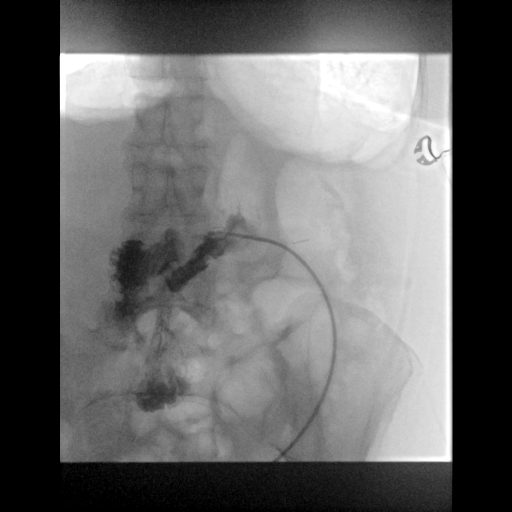
[frame 2/3]
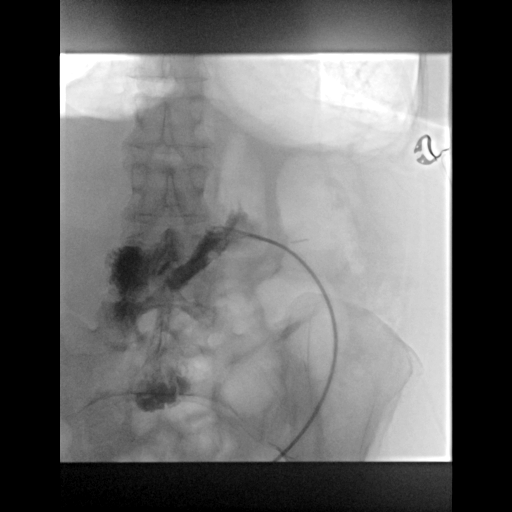
[frame 3/3]
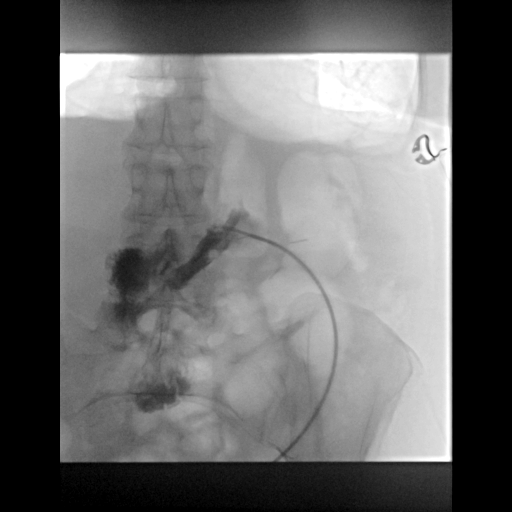

[Series 3: fl angio · 4 of 22 frames shown (2 of 2)]
[frame 4/22]
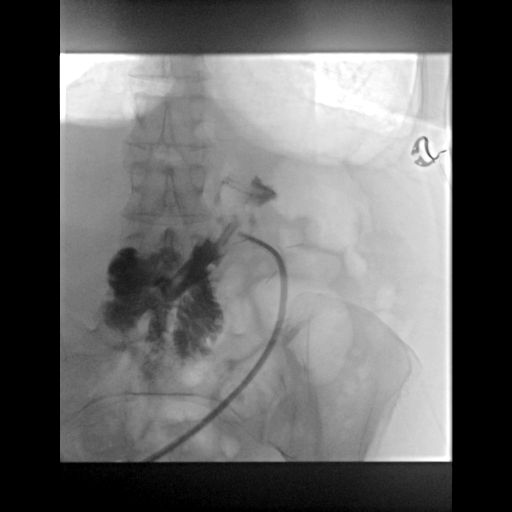
[frame 8/22]
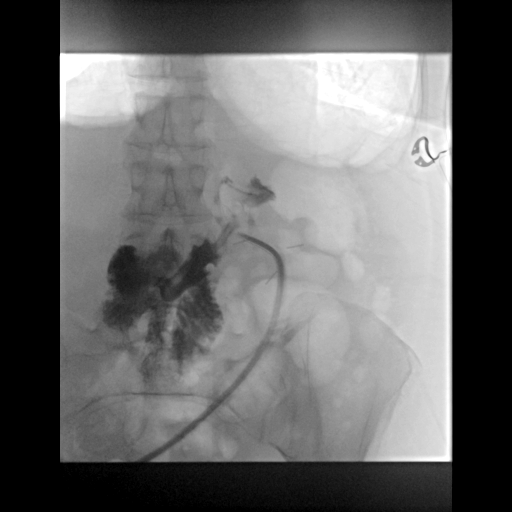
[frame 12/22]
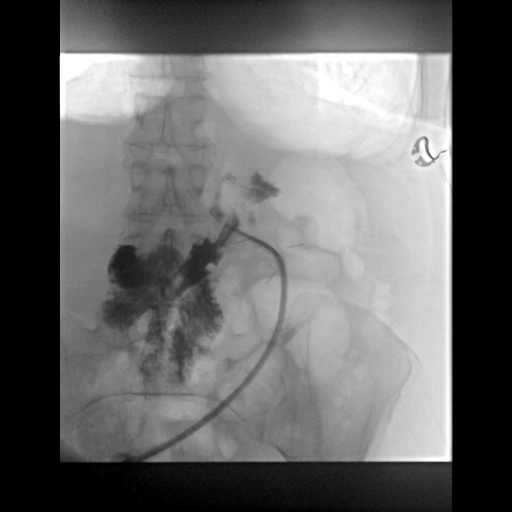
[frame 19/22]
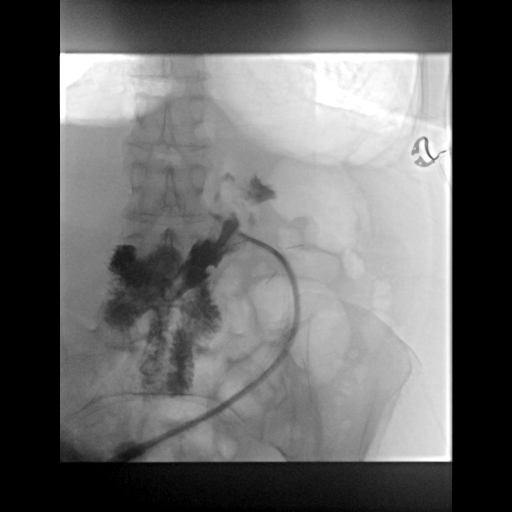

[7 of 7 positions shown; findings below may reference images not displayed]

EXAM:
GASTROSTOMY CATHETER REPLACEMENT

MEDICATIONS:
None

ANESTHESIA/SEDATION:
None

CONTRAST:  15mL OMNIPAQUE IOHEXOL 300 MG/ML SOLN - administered into
the gastric lumen.

FLUOROSCOPY TIME:  Fluoroscopy Time: 0 minutes 12 seconds (11 mGy).

COMPLICATIONS:
None

PROCEDURE:
Informed written consent was obtained from the patient and the
patient's family after a thorough discussion of the procedural
risks, benefits and alternatives. All questions were addressed.
Maximal Sterile Barrier Technique was utilized including caps, mask,
sterile gowns, sterile gloves, sterile drape, hand hygiene and skin
antiseptic. A timeout was performed prior to the initiation of the
procedure.

The epigastrium was prepped with Betadine in a sterile fashion, and
a sterile drape was applied covering the operative field. A sterile
gown and sterile gloves were used for the procedure.

The place Bess Ngan catheter was removed and a new 18 French
balloon retention gastrostomy was placed through the existing ostomy
site. Balloon was inflated with saline.

Contrast was injected confirming location within the stomach.

Patient tolerated the procedure well and remained hemodynamically
stable throughout.

No complications were encountered and no significant blood loss
encountered.
IMPRESSION: Replacement of a displaced percutaneous gastrostomy tube with a new
balloon retention tube through the existing site.

## 2022-10-13 IMAGING — DX DG ABD PORTABLE 1V
1 series · 1 of 1 positions shown · non-contrast
Comparison: Abdominal radiograph dated 09/03/2020.

CLINICAL DATA: 55-year-old female with ileus.

EXAM:
PORTABLE ABDOMEN - 1 VIEW

[abdomen supine]
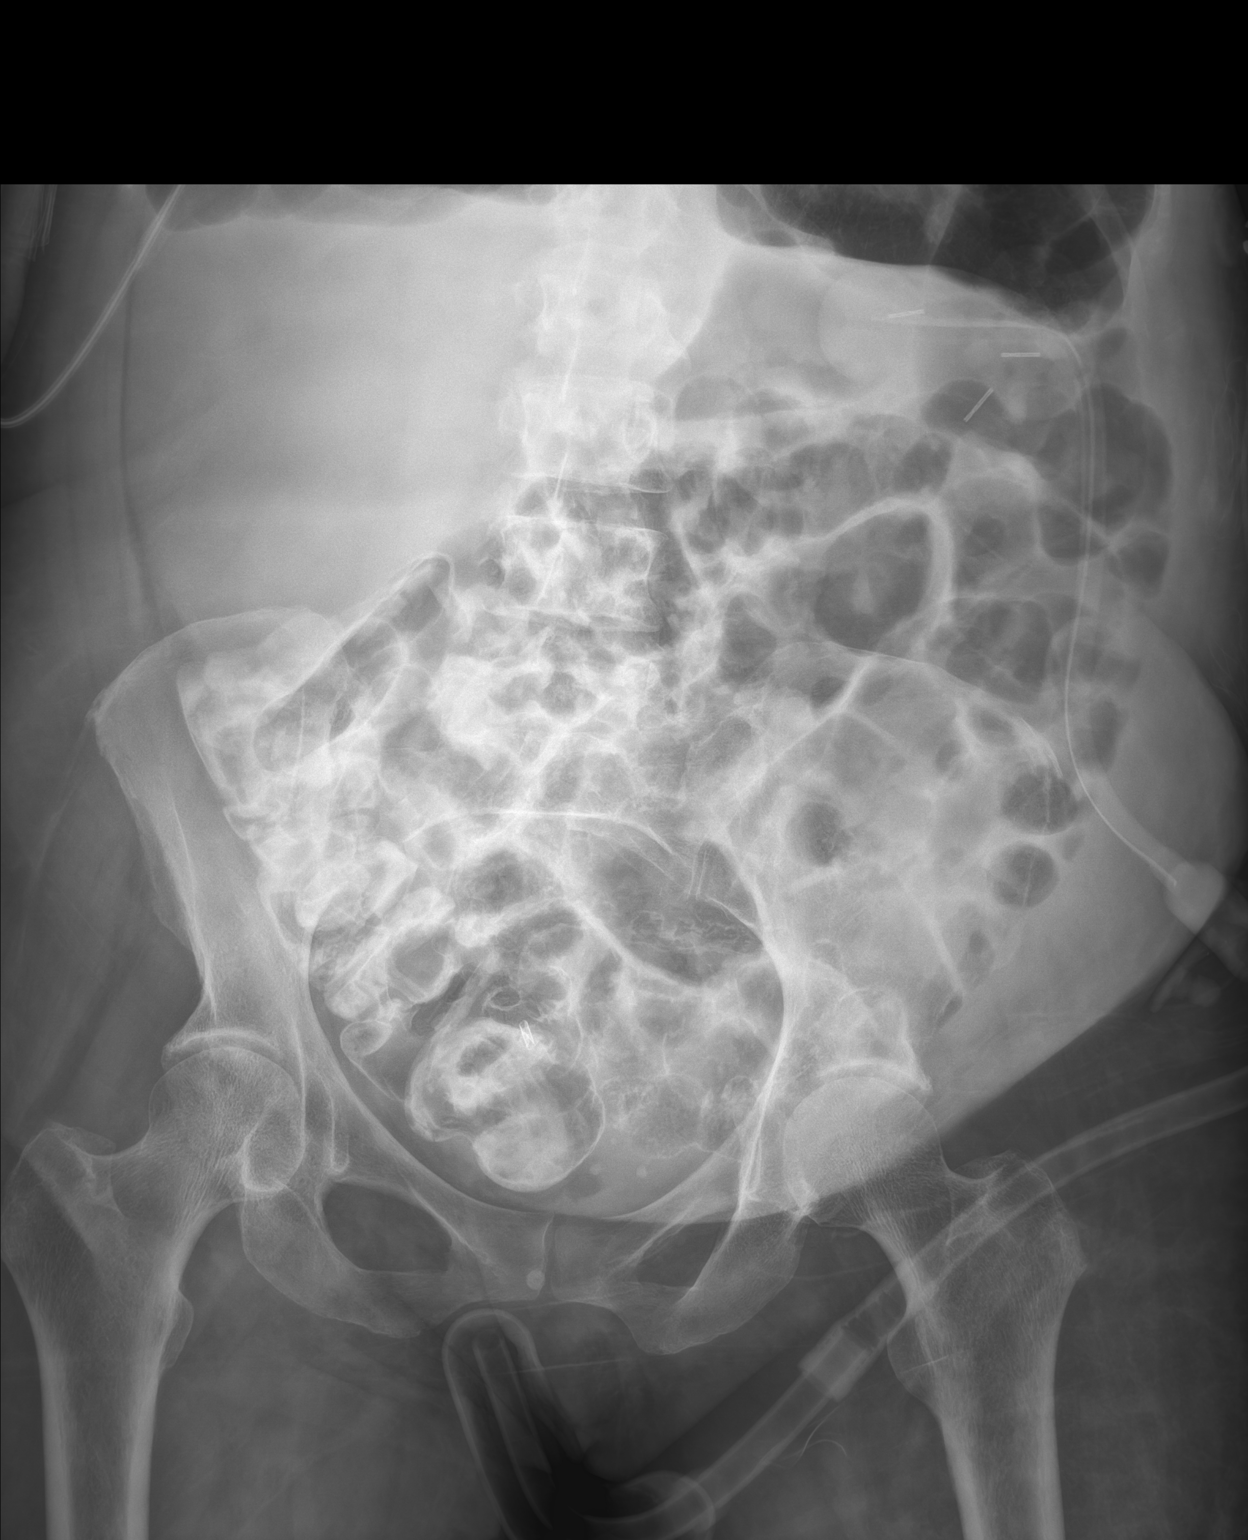

[1 of 1 positions shown; findings below may reference images not displayed]

FINDINGS: Fifteen the gastrostomy in similar position. Interval decrease in
the previously seen dilated small bowel loops. Air is noted
throughout the colon. No free air or radiopaque calculi. The osseous
structures are intact.
IMPRESSION: Interval decrease in the previously seen dilated small bowel loops.
No evidence of obstruction.

## 2022-10-15 IMAGING — DX DG CHEST 1V PORT
1 series · 1 of 1 positions shown · non-contrast
Comparison: 09/07/2020

CLINICAL DATA: Acute respiratory failure

EXAM:
PORTABLE CHEST 1 VIEW

[chest]
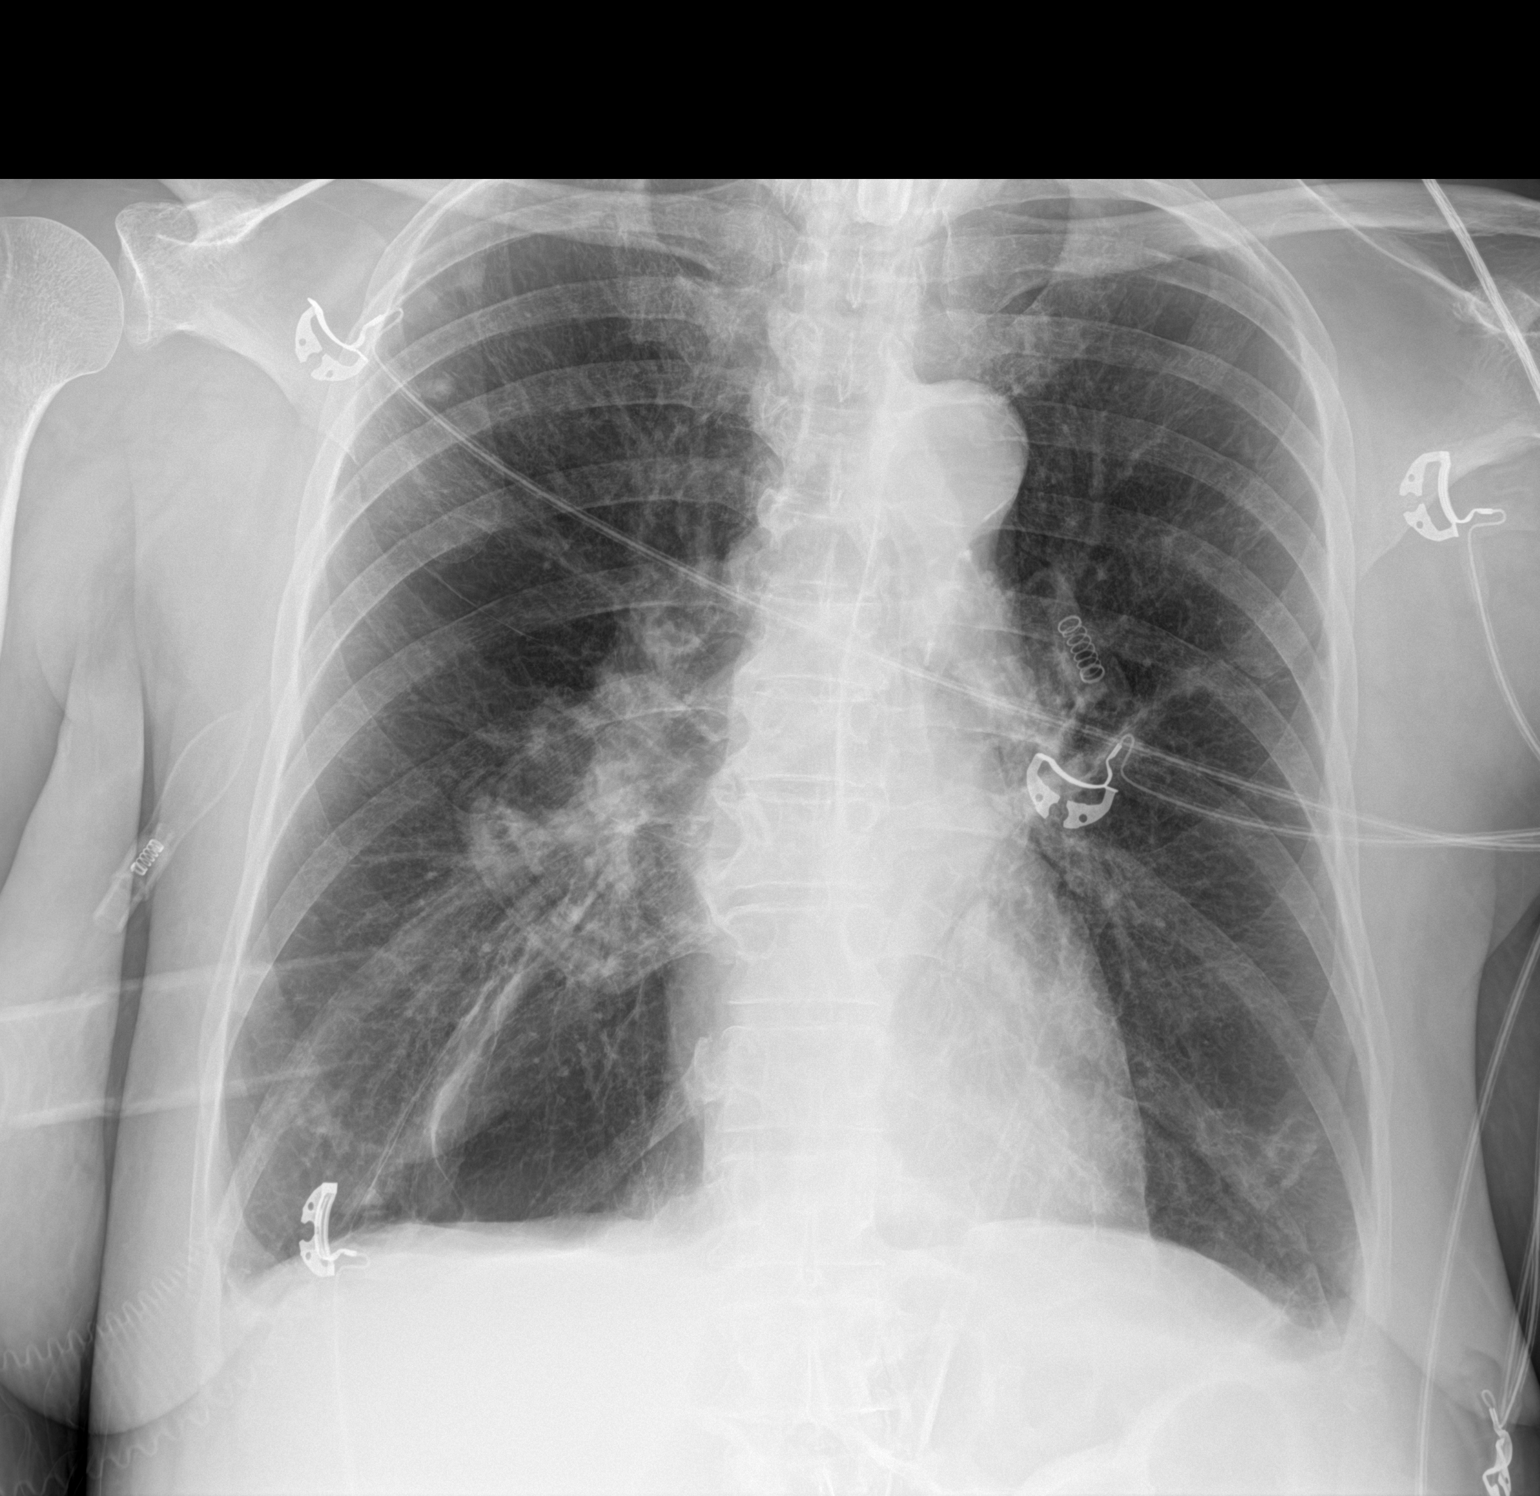

[1 of 1 positions shown; findings below may reference images not displayed]

FINDINGS: Single frontal view of the chest demonstrates stable tracheostomy
tube. Cardiac silhouette is unremarkable. Stable background
emphysema with right basilar scarring. No acute airspace disease,
effusion, or pneumothorax. No acute bony abnormalities.
IMPRESSION: 1. Stable exam, no acute process.

## 2022-10-16 IMAGING — DX DG ABD PORTABLE 1V
1 series · 2 of 2 positions shown · non-contrast
Comparison: 09/12/2020.  CT, 09/02/2020.

CLINICAL DATA: Abdominal distention.

EXAM:
PORTABLE ABDOMEN - 1 VIEW

[Series 1: abdomen · 0.14mm/px · 2 of 2 slices shown]
[im 1/2]
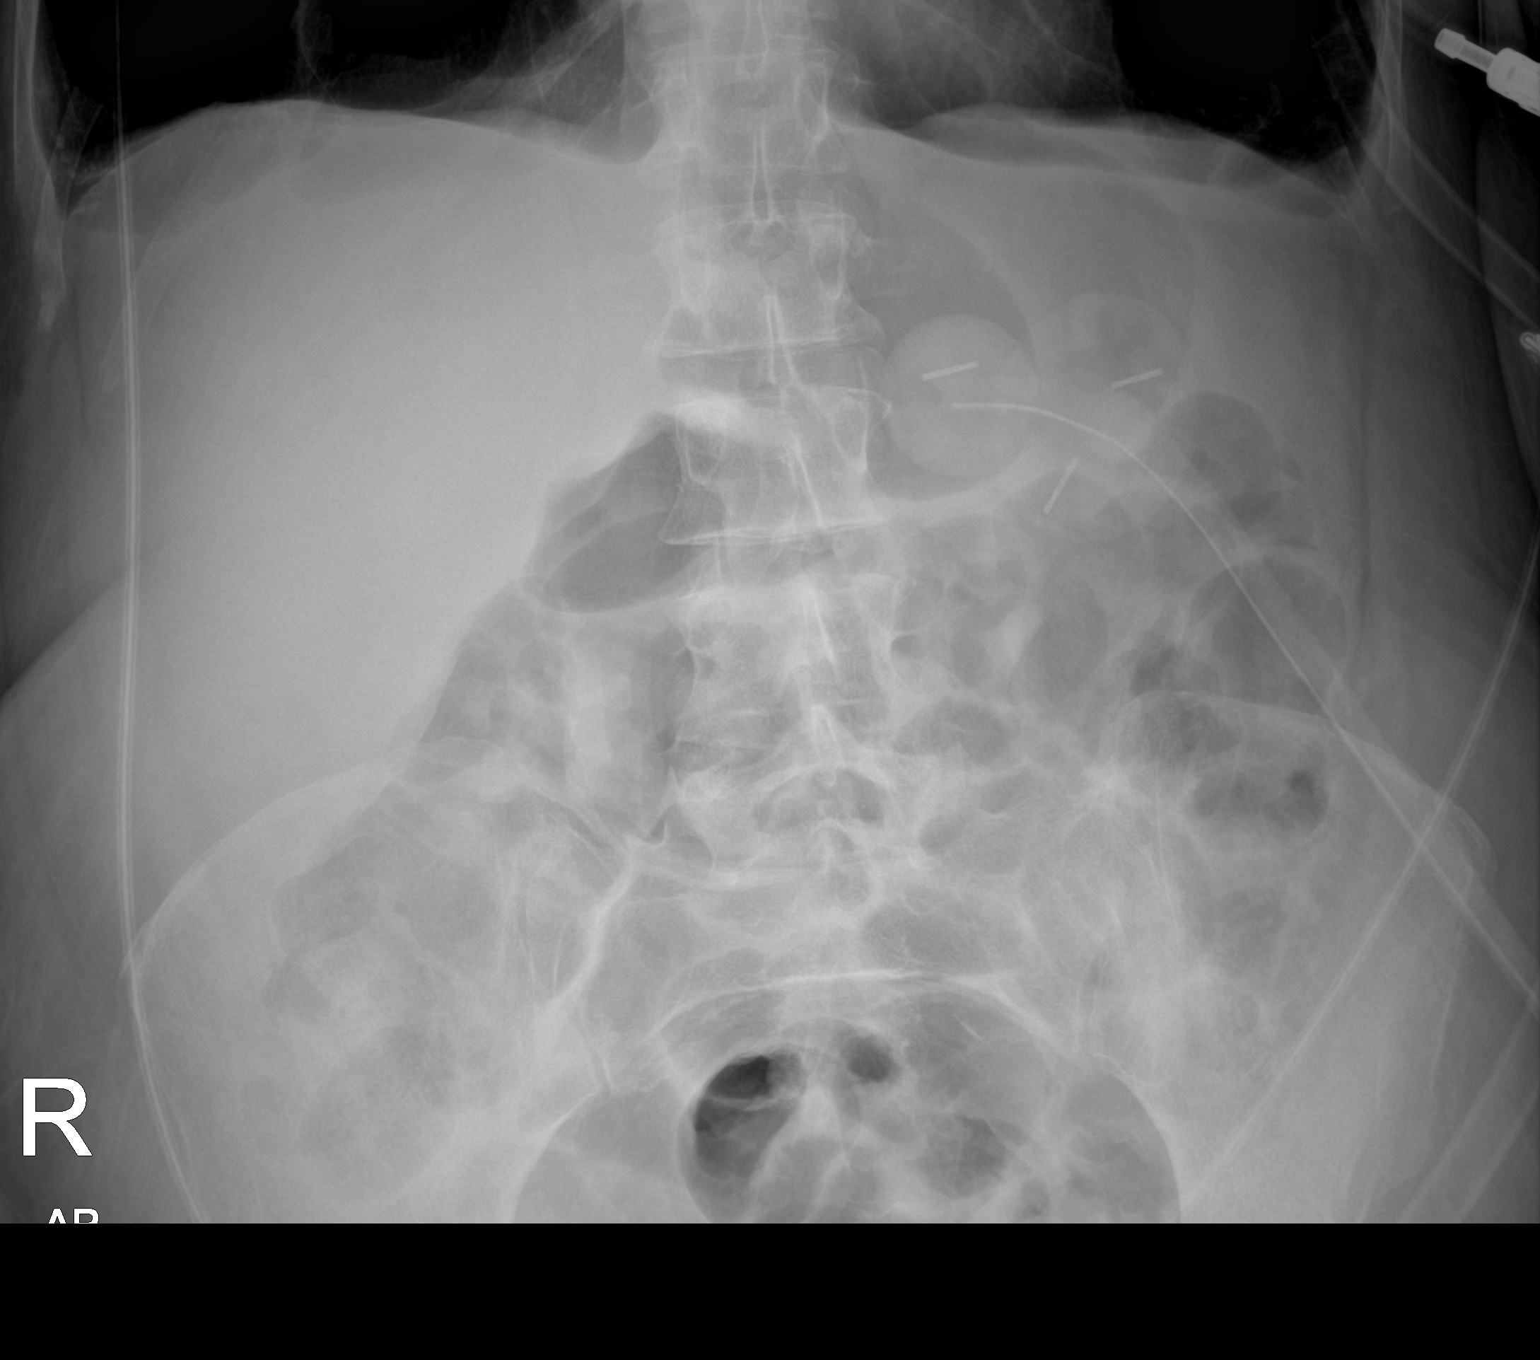
[im 2/2]
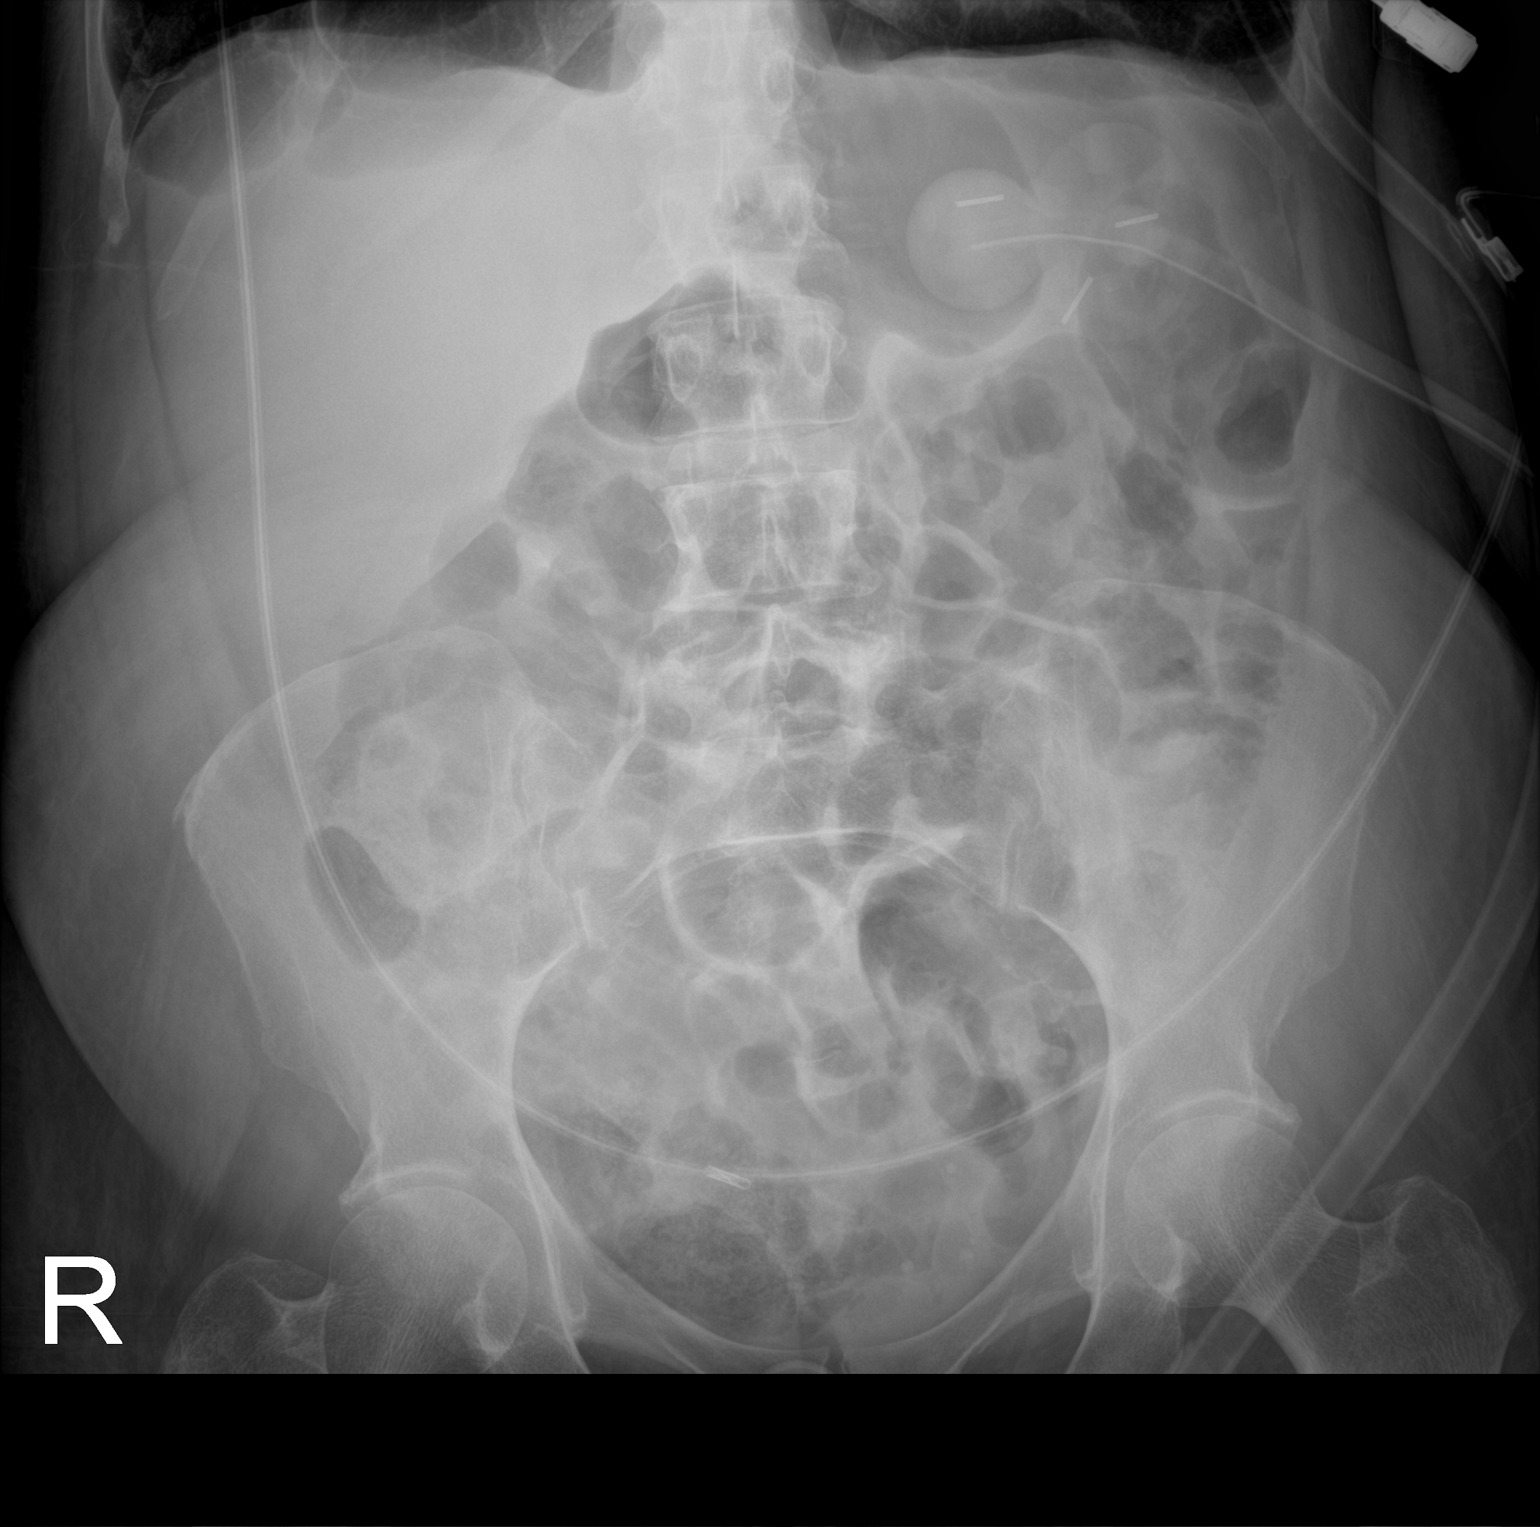

[2 of 2 positions shown; findings below may reference images not displayed]

FINDINGS: Generalized increase in bowel gas, but no bowel distension to
suggest obstruction or significant adynamic ileus. Left upper
quadrant gastrostomy tube appears well positioned, and is stable.
IMPRESSION: 1. No acute findings.  No evidence of bowel obstruction.
2. Mild generalized increase in bowel gas.

## 2022-11-01 IMAGING — DX DG CHEST 1V PORT
1 series · 1 of 1 positions shown · non-contrast
Comparison: Radiographs 09/14/2020 and 09/07/2020.  CT 02/28/2020.

CLINICAL DATA: Sore throat with bloody mucus.

EXAM:
PORTABLE CHEST 1 VIEW

[chest ap]
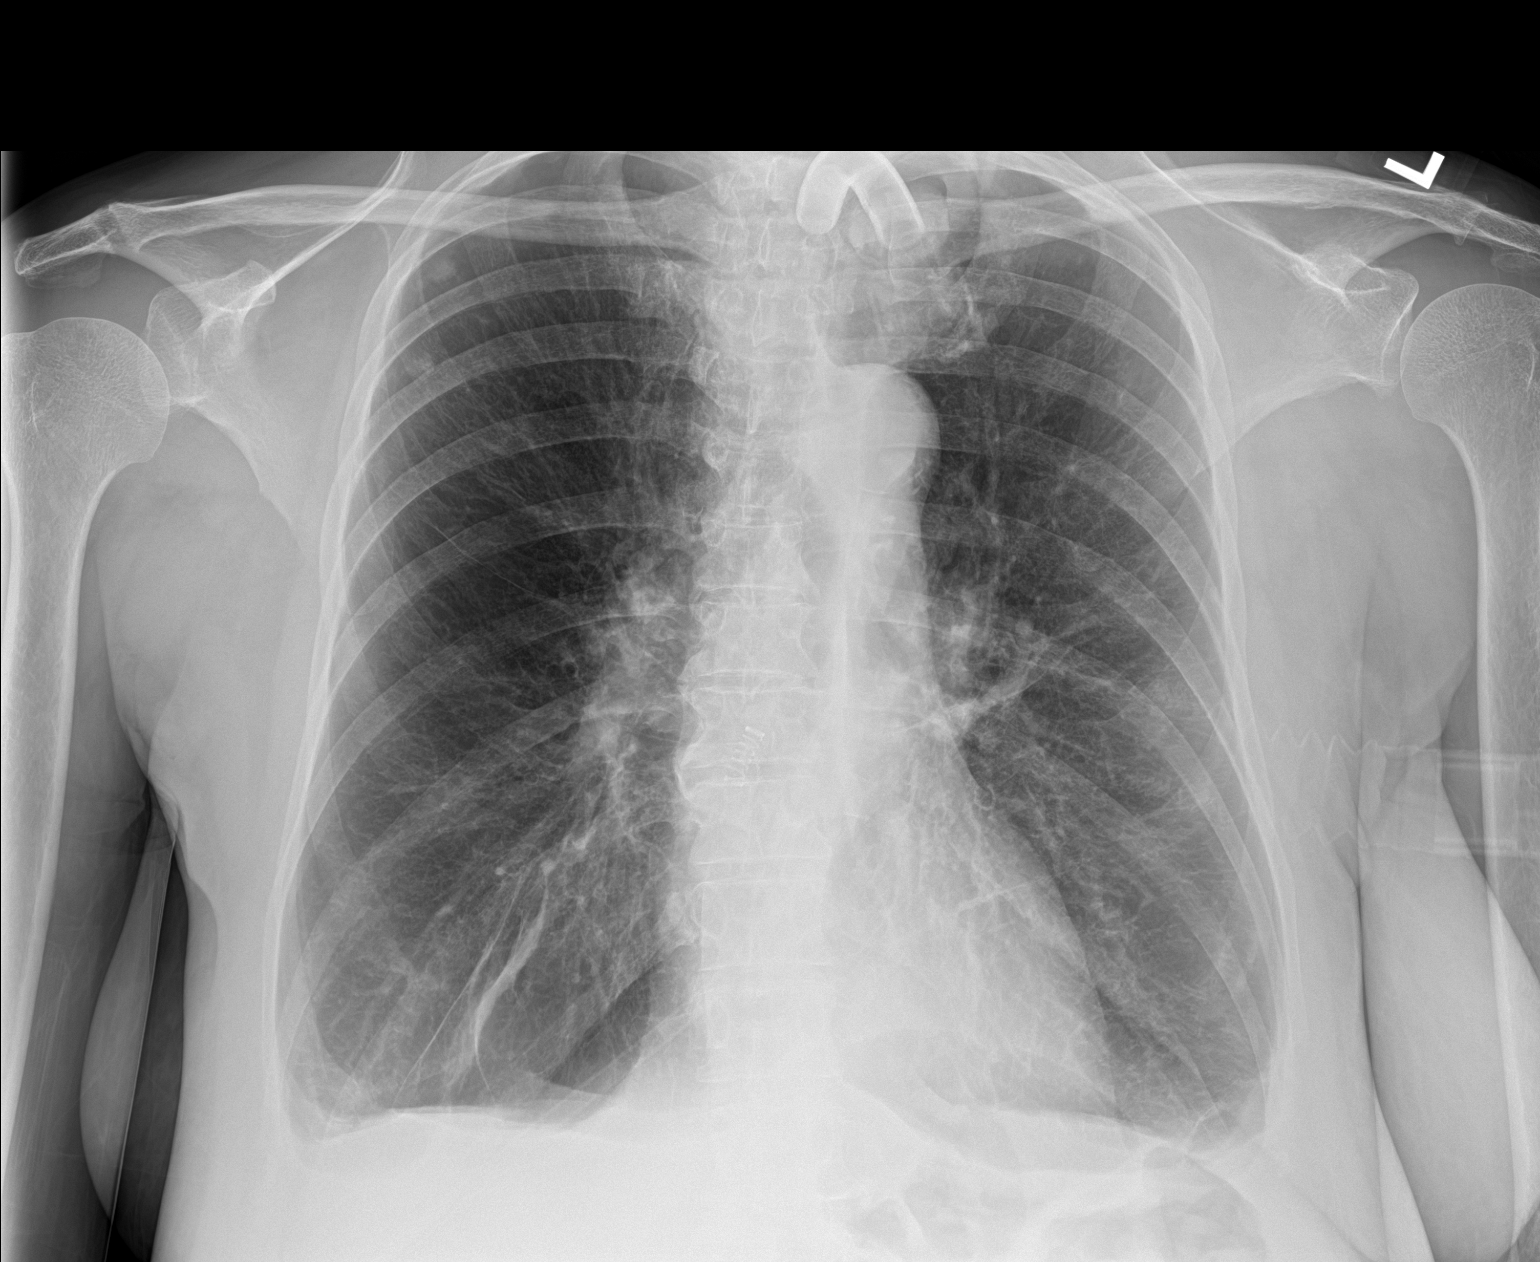

[1 of 1 positions shown; findings below may reference images not displayed]

FINDINGS: 0050 hours. Tracheostomy appears well positioned. The heart size and
mediastinal contours are stable. There are calcified mediastinal and
hilar lymph nodes as demonstrated on previous CT. Probable small
bilateral pleural effusions with associated bibasilar atelectasis.
Small calcified right upper lobe granulomas are stable. No edema or
suspicious nodularity identified.
IMPRESSION: 1. Stable sequela of prior granulomatous disease.
2. Small bilateral pleural effusions.

## 2022-11-01 IMAGING — DX DG CHEST 1V PORT
1 series · 1 of 1 positions shown · non-contrast
Comparison: 10/01/2020

CLINICAL DATA: Endotracheal tube placement

EXAM:
PORTABLE CHEST 1 VIEW

[chest]
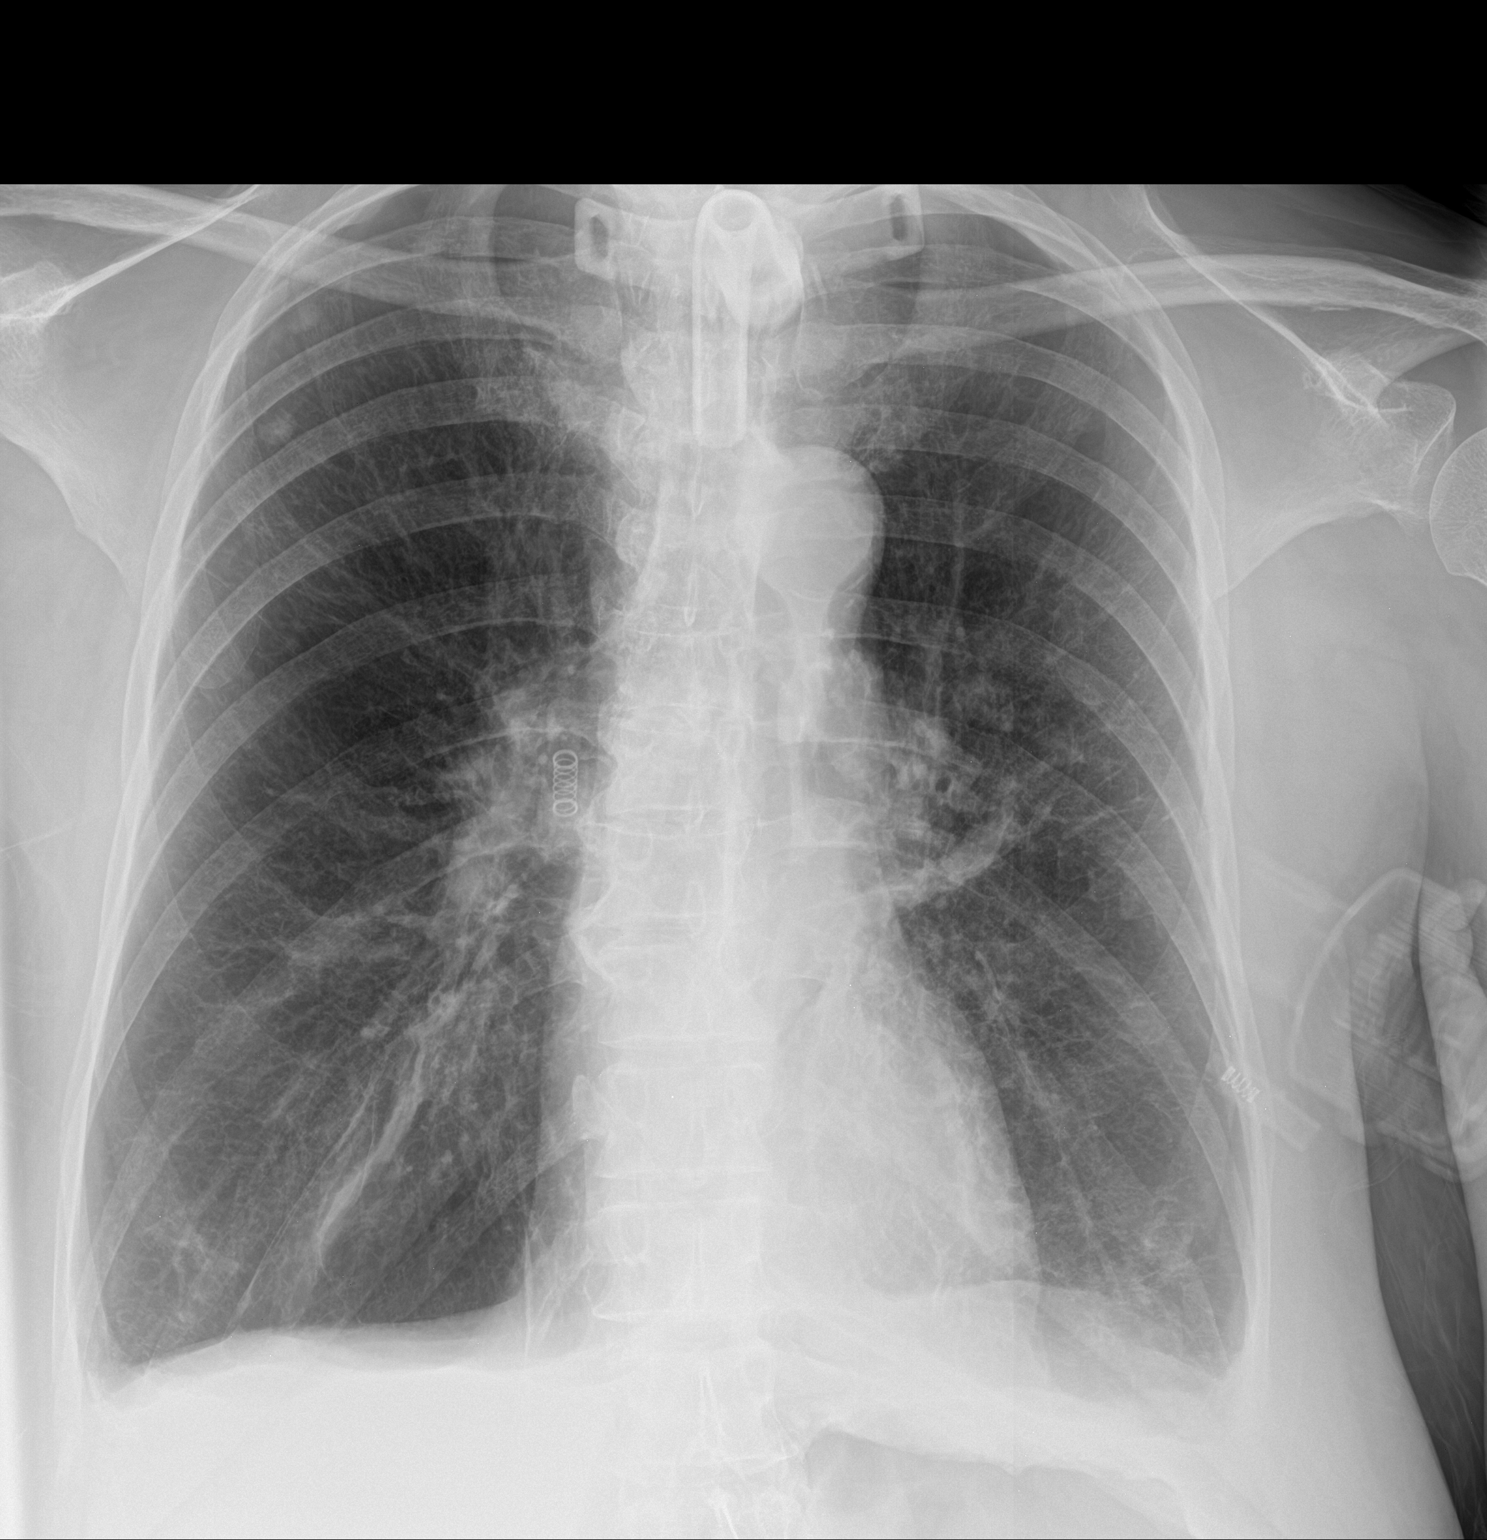

[1 of 1 positions shown; findings below may reference images not displayed]

FINDINGS: An endotracheal tube is present with tip above the carina. Heart
size and pulmonary vascularity are normal. Emphysematous changes and
scattered fibrosis in the lungs. Calcified granulomas in the lung
apices. No pleural effusion or pneumothorax. Mediastinal contours
appear intact. Calcification of the aorta.
IMPRESSION: Endotracheal tube appears in satisfactory position. Emphysematous
changes and fibrosis in the lungs.

## 2022-12-28 IMAGING — CT CT ABD-PELV W/ CM
2 of 5 series · 15 of 46 positions shown, 17 images · IV contrast (Omni 300)
Comparison: Abdominopelvic CT 09/02/2020

CLINICAL DATA: Bowel obstruction suspected

Right-sided abdominal pain.  History of small-bowel obstruction.
EXAM:
CT ABDOMEN AND PELVIS WITH CONTRAST
TECHNIQUE: Multidetector CT imaging of the abdomen and pelvis was performed
using the standard protocol following bolus administration of
intravenous contrast.
CONTRAST:  100mL OMNIPAQUE IOHEXOL 300 MG/ML  SOLN

[Series 4: a/p w/ 5mm · axial · 0.81mm/px · z∈[+646,+966]mm · 12 of 72 slices shown, 14 images]
[im 4/72  soft-tissue]
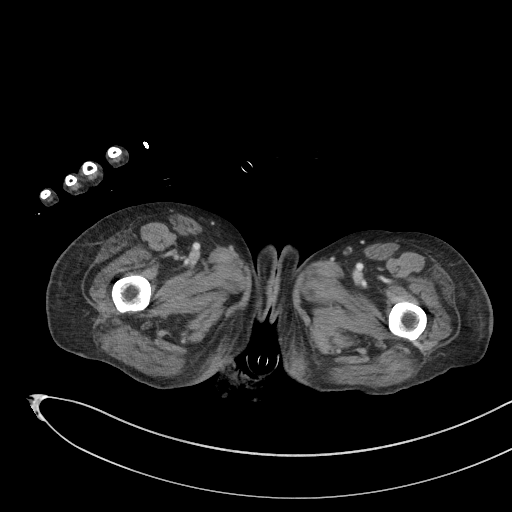
[im 4/72  bone]
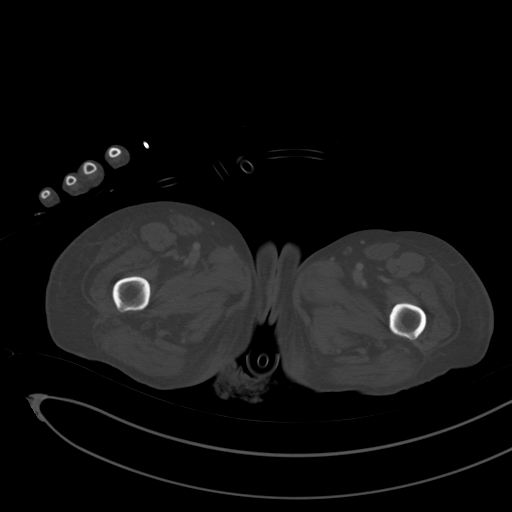
[im 12/72  soft-tissue]
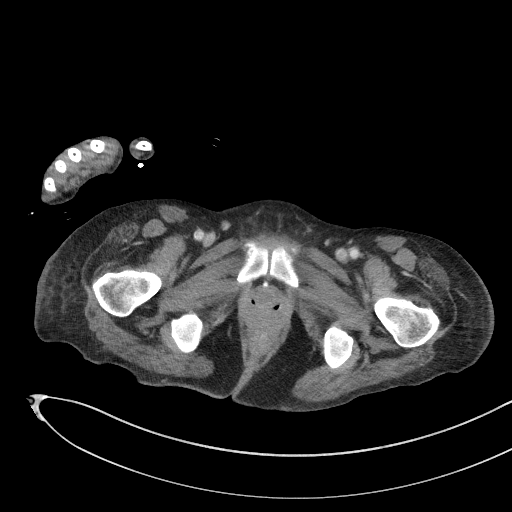
[im 15/72  soft-tissue]
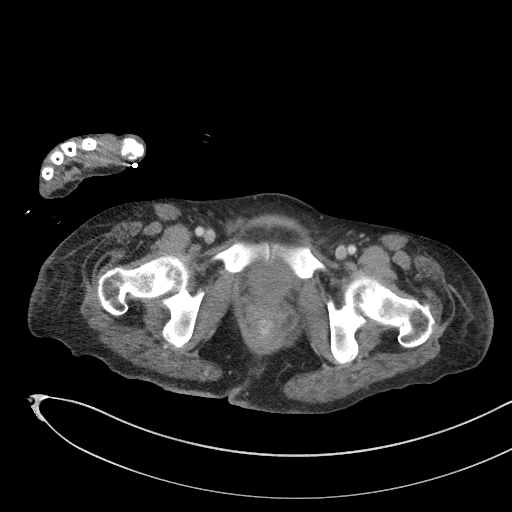
[im 23/72  soft-tissue]
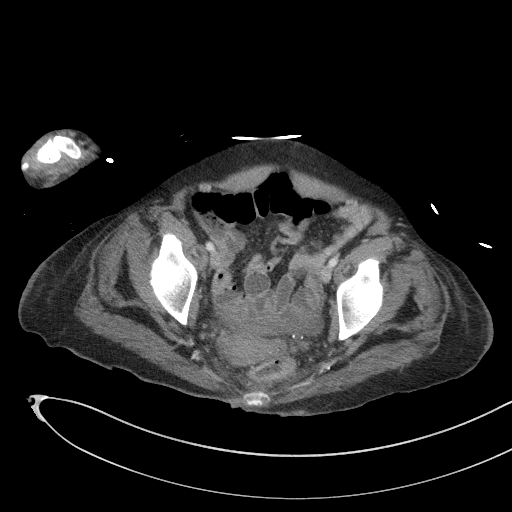
[im 27/72  soft-tissue]
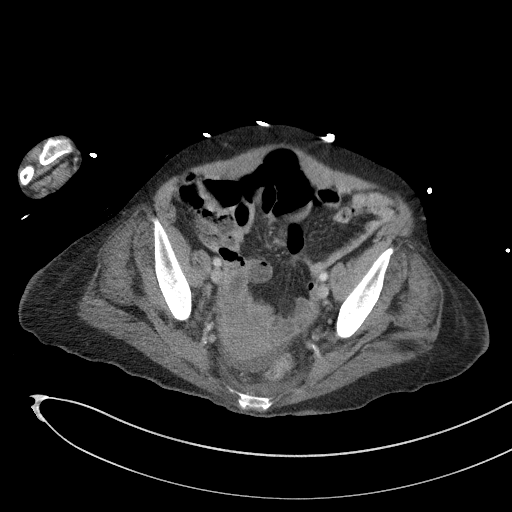
[im 34/72  soft-tissue]
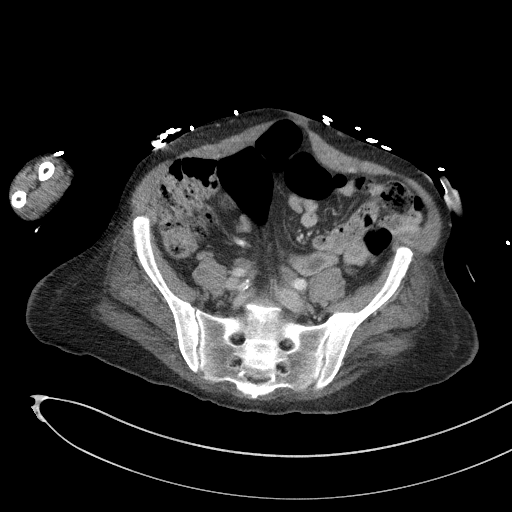
[im 38/72  soft-tissue]
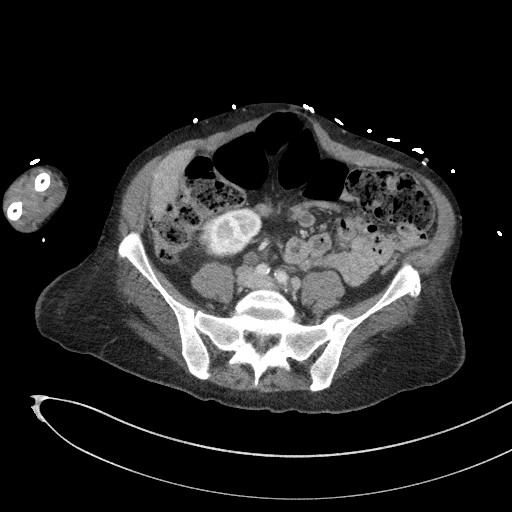
[im 45/72  soft-tissue]
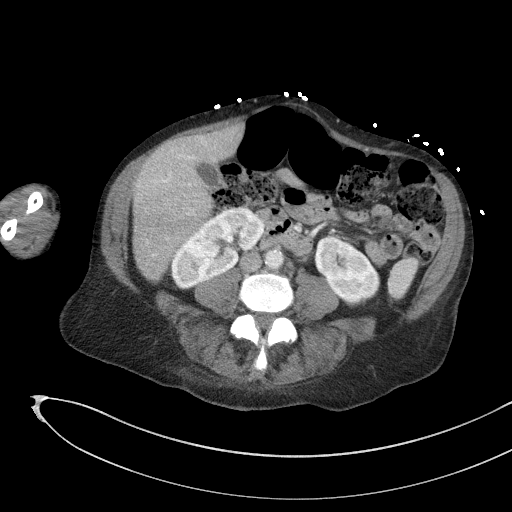
[im 49/72  soft-tissue]
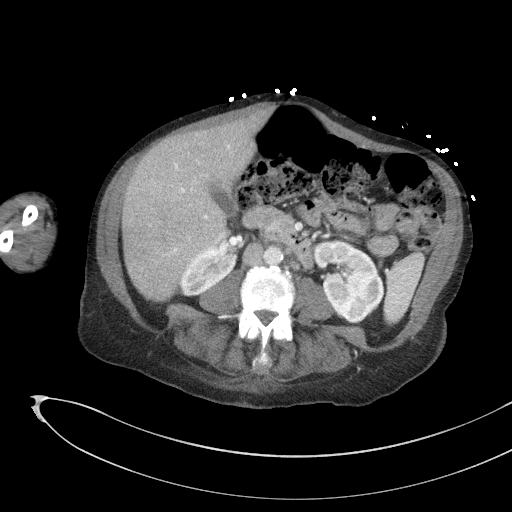
[im 49/72  bone]
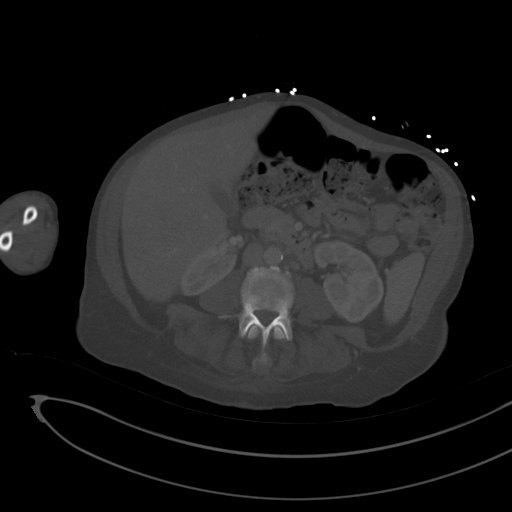
[im 57/72  soft-tissue]
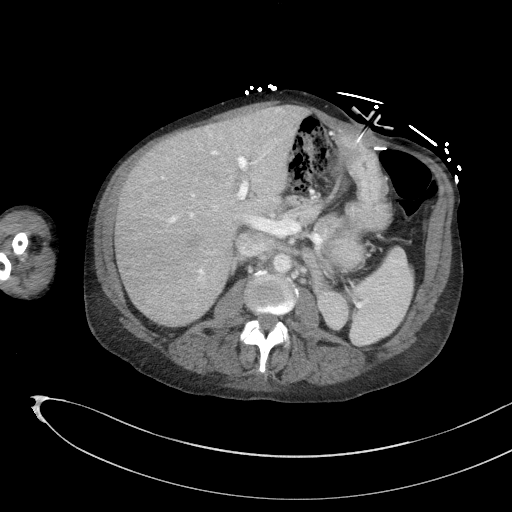
[im 60/72  soft-tissue]
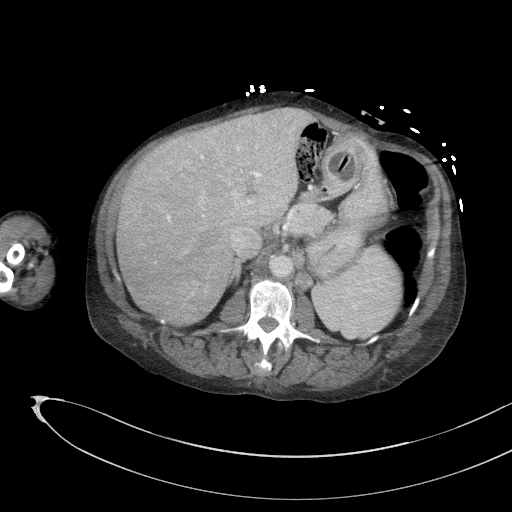
[im 68/72  soft-tissue]
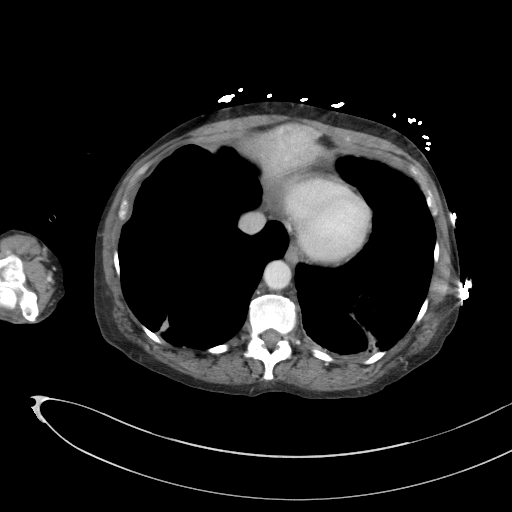

[Series 7: a/p w/ cor · coronal · 0.70mm/px · 3 of 135 slices shown]
[im 45/135  soft-tissue]
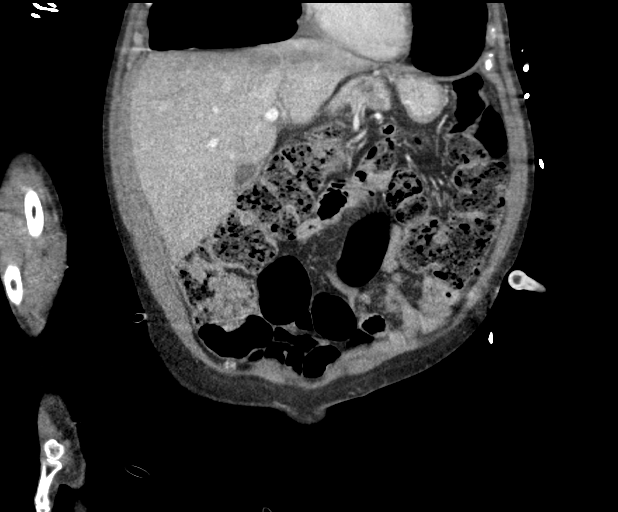
[im 60/135  soft-tissue]
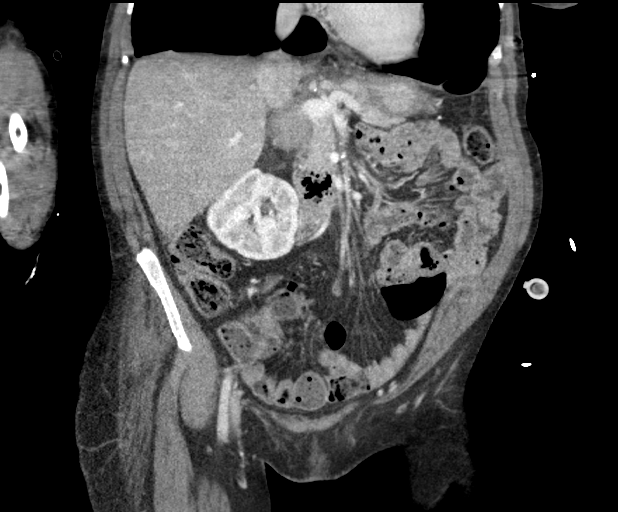
[im 75/135  soft-tissue]
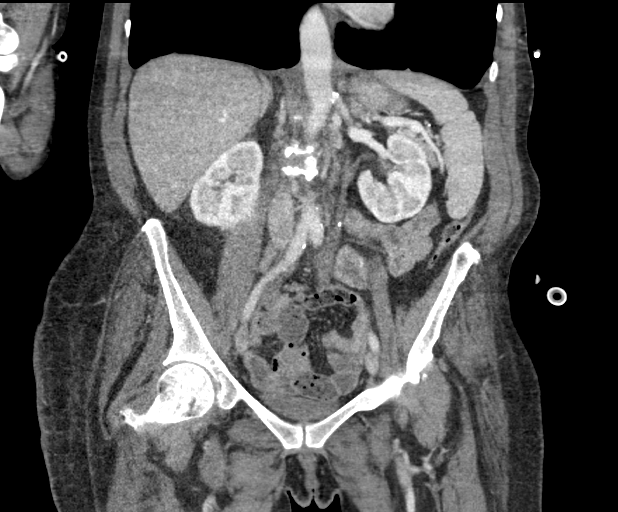

[15 of 46 positions shown; findings below may reference images not displayed]

FINDINGS: Lower chest: Emphysema with bandlike scarring in the lung bases.
Questionable area of nodularity only partially included in field of
view on the left lower lobe, series 5, image 1. This may represent
superimposition of scarring, however is incompletely characterized.

Hepatobiliary: Subcentimeter cyst in the central liver. No
suspicious liver lesion. Gallbladder physiologically distended, no
calcified stone. No biliary dilatation.

Pancreas: No ductal dilatation or inflammation.

Spleen: Normal in size without focal abnormality.

Adrenals/Urinary Tract: Normal right adrenal gland. Minimal
thickening of the left adrenal gland without dominant nodule. No
hydronephrosis or perinephric edema. Homogeneous renal enhancement
with symmetric excretion on delayed phase imaging. Urinary bladder
is partially distended without wall thickening.

Stomach/Bowel: Bowel assessment is limited in the absence of enteric
contrast. Gastrostomy tube appears appropriately positioned in the
stomach which is decompressed. There is no small bowel dilatation or
obstruction. Few fluid-filled loops of small bowel in the pelvis
with fecalization of distal small bowel contents. Normal appendix.
Moderate volume of stool in the ascending, transverse, and proximal
descending colon. Transverse and sigmoid colon are redundant. No
abnormal rectal distention. No definite colonic wall thickening or
inflammation.

Vascular/Lymphatic: Aortic atherosclerosis. No aortic aneurysm.
Patent portal vein. There is no abdominopelvic adenopathy.

Reproductive: Uterus remains in situ, mildly heterogeneous. No
adnexal mass.

Other: Small amount of pelvic free fluid which was also seen on
prior exam. There is mild presacral edema. No free air.

Musculoskeletal: There are no acute or suspicious osseous
abnormalities.
IMPRESSION: 1. No evidence of bowel obstruction. Moderate colonic stool burden
with fecalization of distal small bowel contents, suggesting slow
transit.
2. Gastrostomy tube appears appropriately positioned in the stomach
which is decompressed.
3. Small amount of pelvic free fluid was also seen on prior exam.
4. Basilar emphysema with bandlike scarring. Question of left lower
lobe pulmonary nodule versus superimposed scarring, only partially
included in the field of view. Recommend nonemergent follow-up chest
CT for characterization on an elective basis.

Aortic Atherosclerosis ([id number].K) and Emphysema ([id number].5).

## 2023-01-09 IMAGING — CT CT CHEST W/O CM
2 of 4 series · 15 of 36 positions shown, 18 images · non-contrast
Comparison: Plain film of earlier in the day. Most recent chest CT
of 02/28/2020.

CLINICAL DATA: Increased secretions from tracheostomy.

EXAM:
CT CHEST WITHOUT CONTRAST
TECHNIQUE: Multidetector CT imaging of the chest was performed following the
standard protocol without IV contrast.

[Series 3: chest w/o 2mm st · axial · non-contrast · 0.65mm/px · z∈[+1064,+1376]mm · 12 of 183 slices shown, 15 images]
[im 14/183  mediastinal]
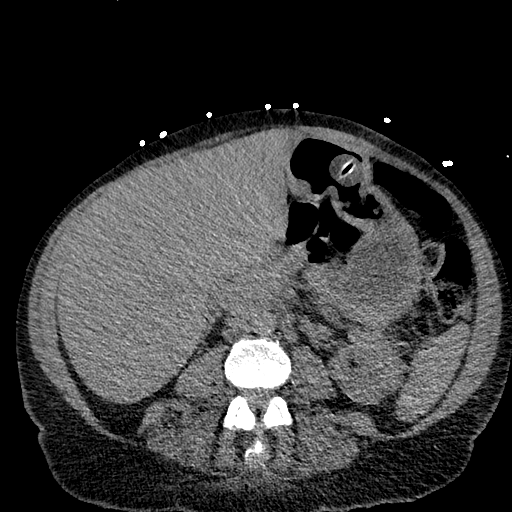
[im 14/183  lung]
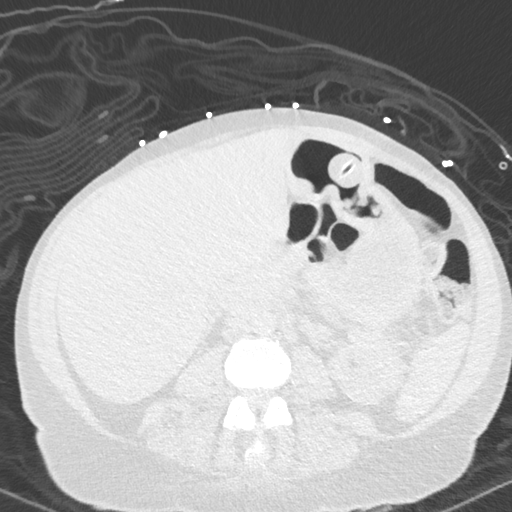
[im 27/183  lung]
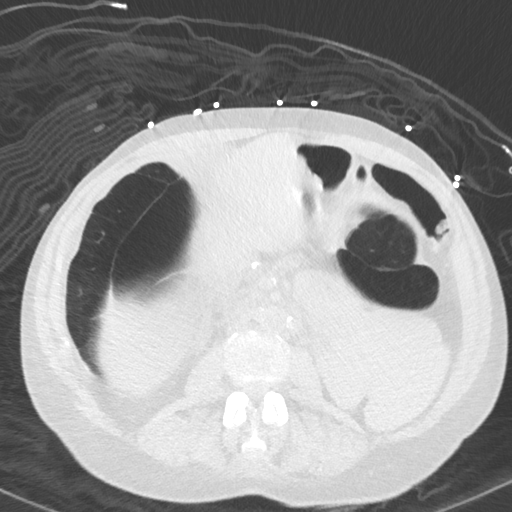
[im 40/183  lung]
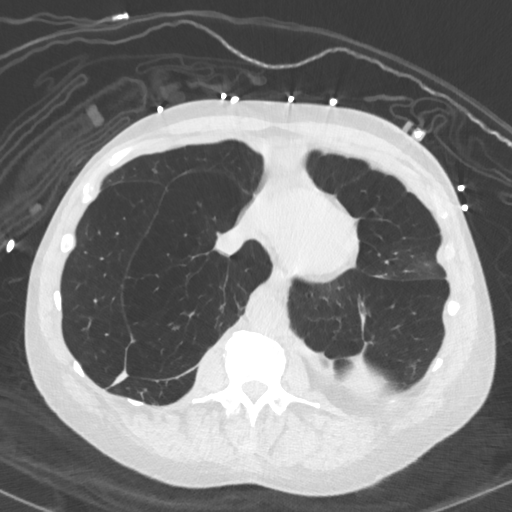
[im 53/183  lung]
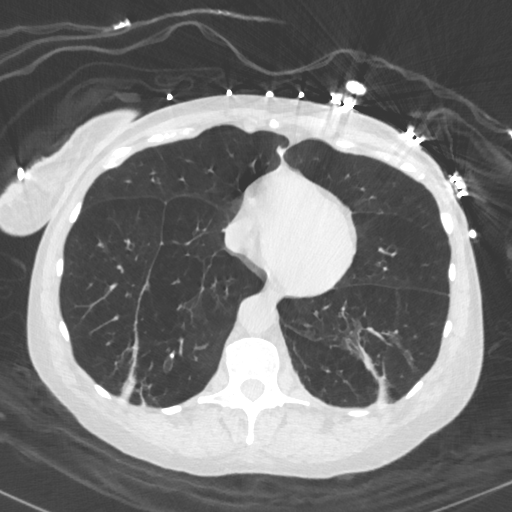
[im 66/183  mediastinal]
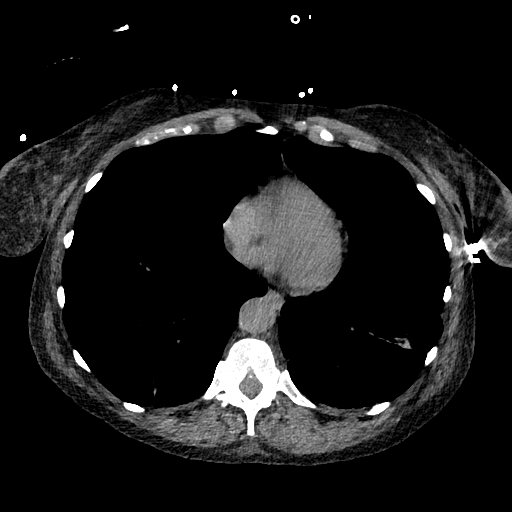
[im 66/183  lung]
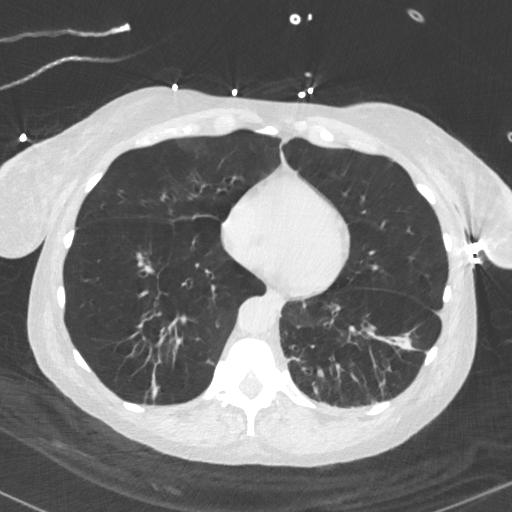
[im 79/183  lung]
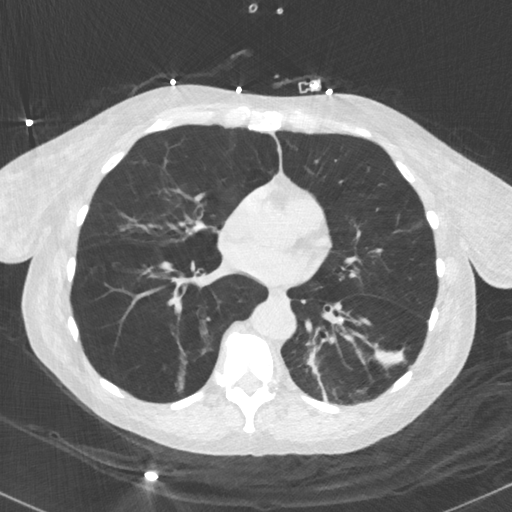
[im 105/183  lung]
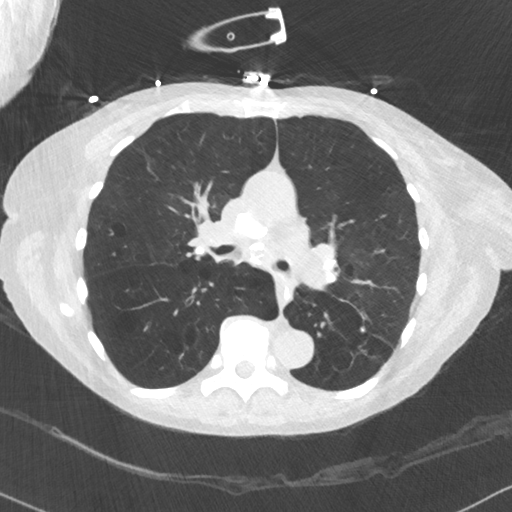
[im 118/183  lung]
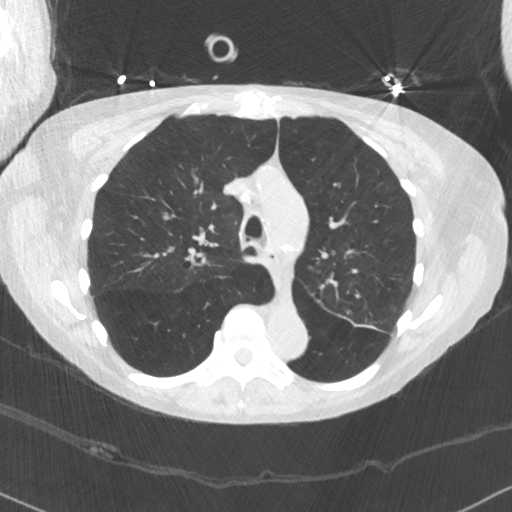
[im 131/183  mediastinal]
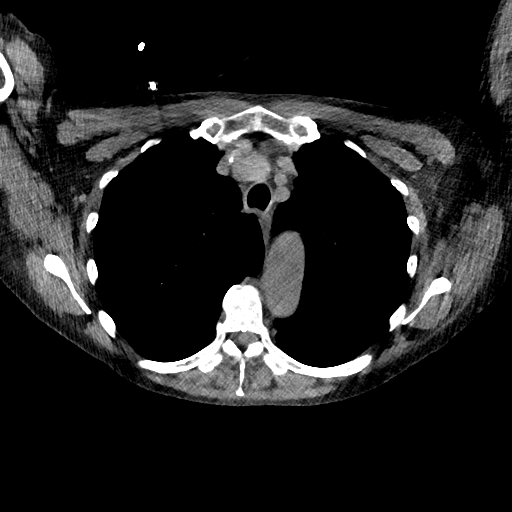
[im 131/183  lung]
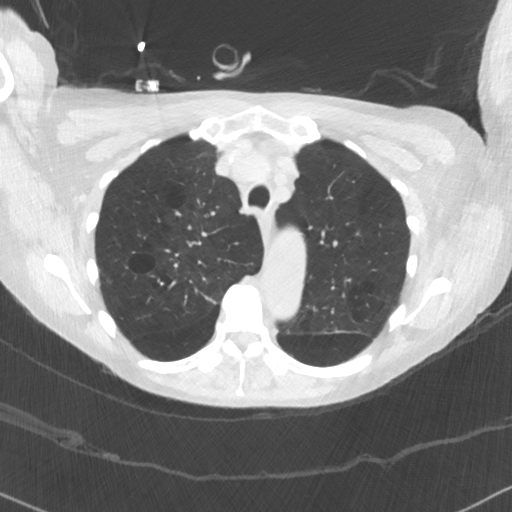
[im 144/183  lung]
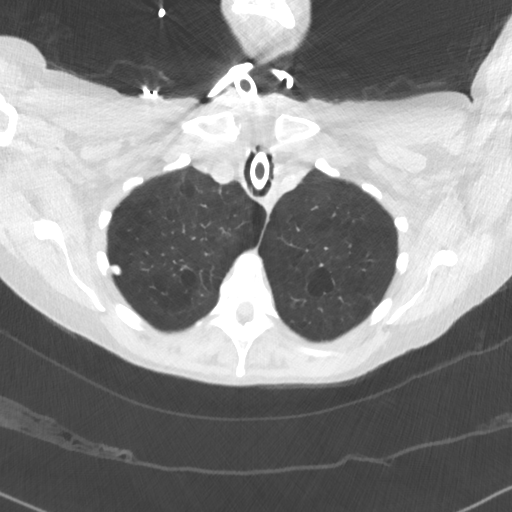
[im 157/183  lung]
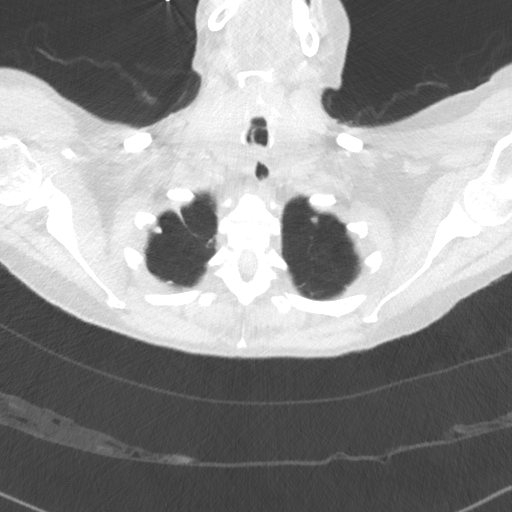
[im 170/183  lung]
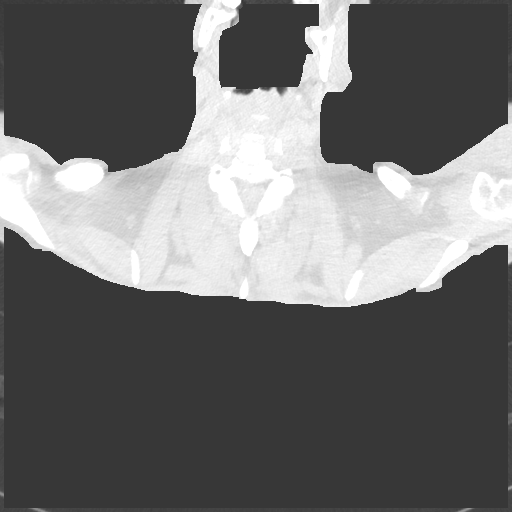

[Series 5: chest w/o 2mm st cor · coronal · non-contrast · 0.64mm/px · 3 of 127 slices shown]
[im 26/127  lung]
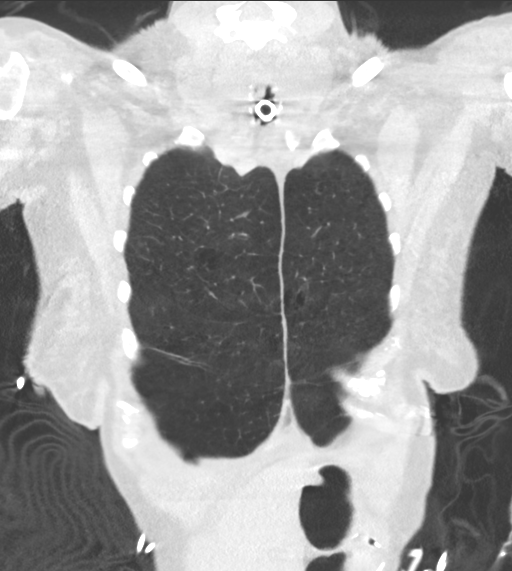
[im 51/127  lung]
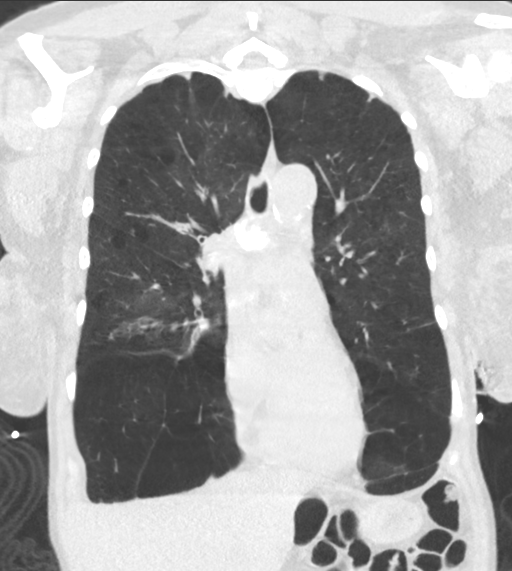
[im 76/127  lung]
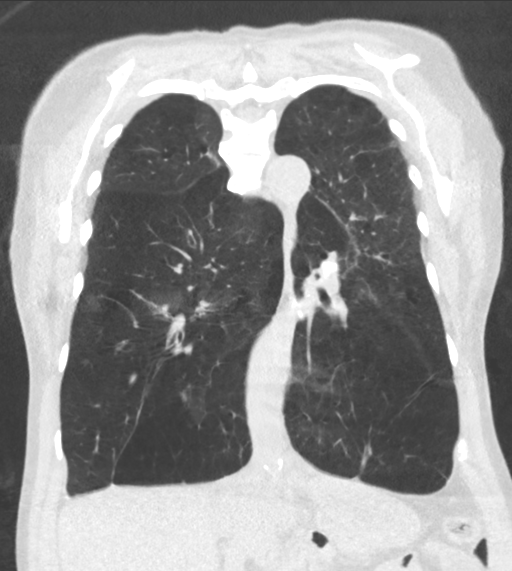

[15 of 36 positions shown; findings below may reference images not displayed]

FINDINGS: Cardiovascular: Aortic atherosclerosis. Normal heart size, without
pericardial effusion. Lad coronary artery calcification.

Mediastinum/Nodes: Calcified mediastinal and bilateral hilar nodes
are likely related to old granulomatous disease.

Lungs/Pleura: No pleural fluid. Advanced bullous emphysema.
Tracheostomy appropriately positioned. Dependent presumed secretions
within the mainstem bronchi.

Multiple bilateral calcified nodules which are likely related to old
granulomatous disease. Bibasilar cylindrical bronchiectasis is
progressive and likely post infectious/inflammatory. Progressive
bibasilar scarring.

Minimal motion degradation.

Upper Abdomen: Normal imaged portions of the liver, spleen, kidneys.
Gastrostomy tube.

Musculoskeletal: No acute osseous abnormality.
IMPRESSION: 1.  No acute process in the chest.
2. Advanced bullous emphysema with progressive bibasilar scarring
and post infectious/inflammatory bronchiectasis.
3. Age advanced coronary artery atherosclerosis. Recommend
assessment of coronary risk factors and consideration of medical
therapy.
4.  Aortic Atherosclerosis ([id number]).

## 2023-01-26 IMAGING — XA IR REPLACE G-TUBE/COLONIC TUBE
2 series · 14 of 18 positions shown · non-contrast
Comparison: Fluoroscopic guided gastrostomy tube
exchange-09/13/2018

INDICATION: Inadvertent removal of feeding gastrostomy tube.

EXAM:
FLUOROSCOPIC GUIDED REPLACEMENT OF GASTROSTOMY TUBE

[Series 1: fl (-) angio · 4 acquisitions, 6 frames shown (1 of 2)]
[im 1/4]
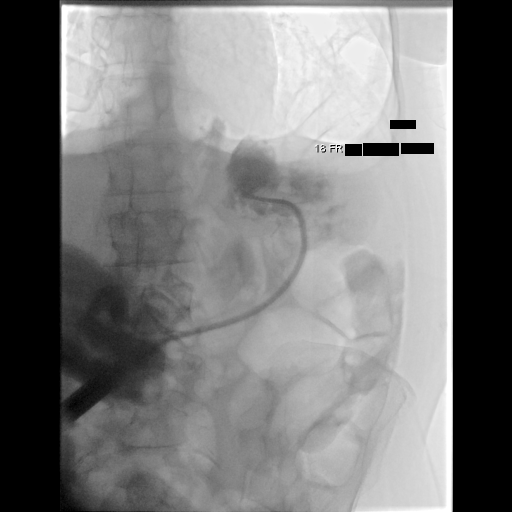
[im 1/4]
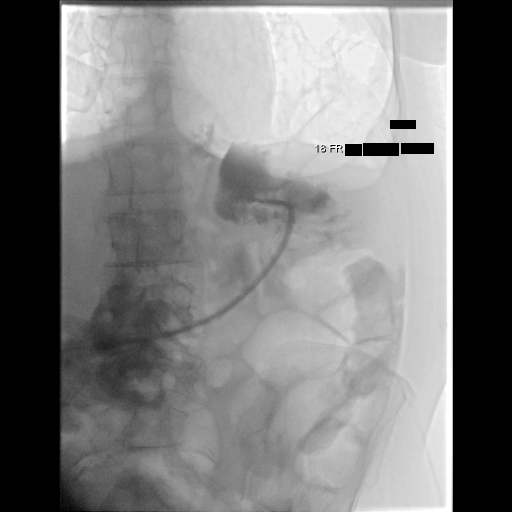
[im 2/4]
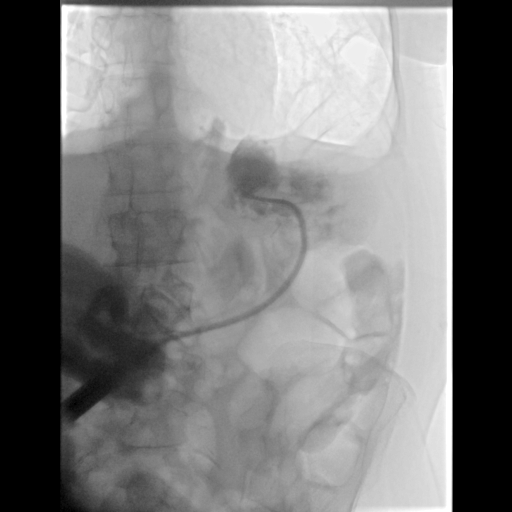
[im 2/4]
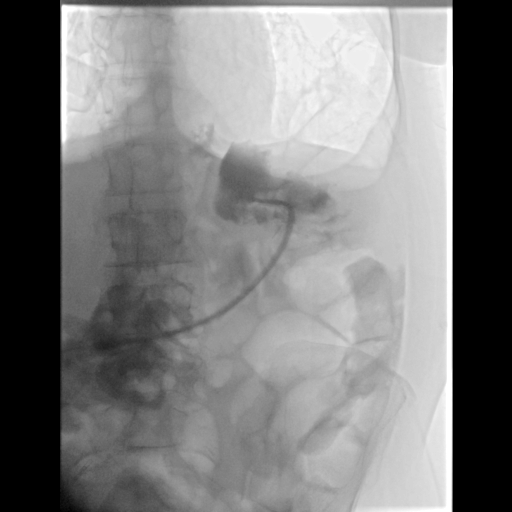
[im 2/4]
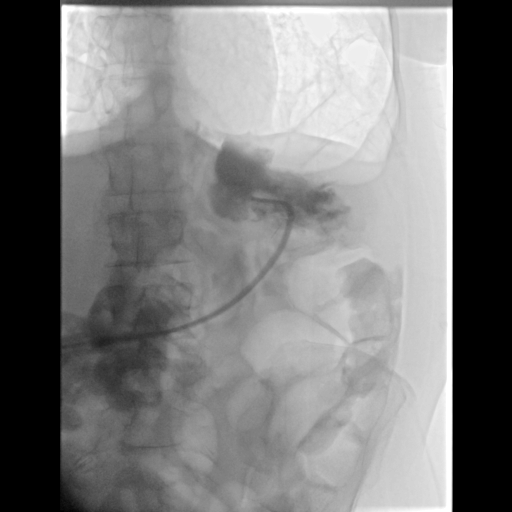
[im 4/4]
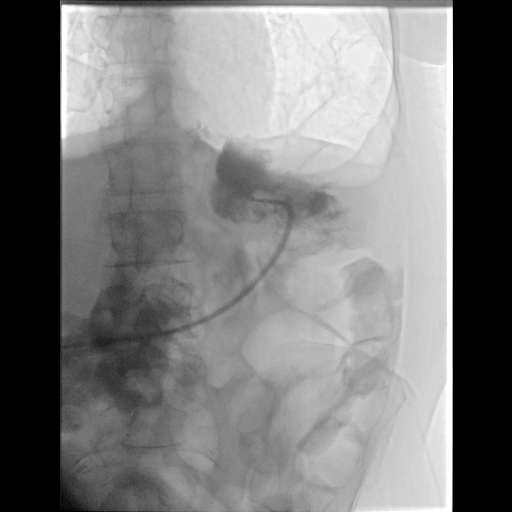

[Series 2: fl (-) angio · 4 acquisitions, 8 frames shown (2 of 2)]
[im 1/4]
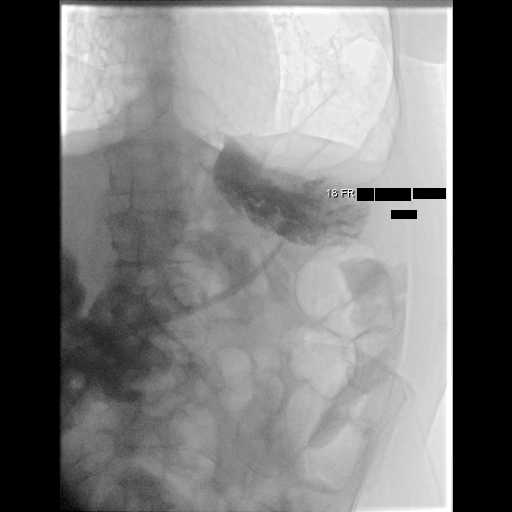
[im 1/4]
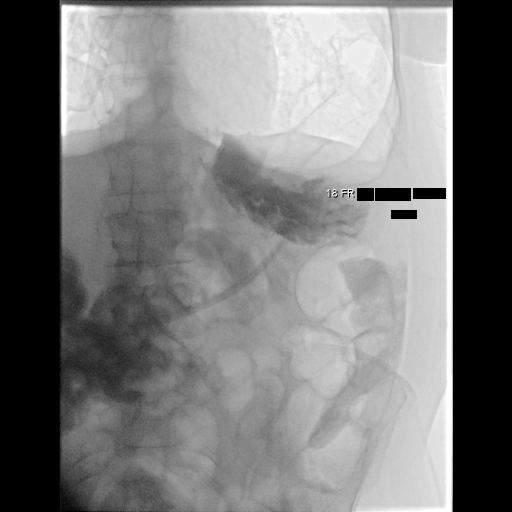
[im 1/4]
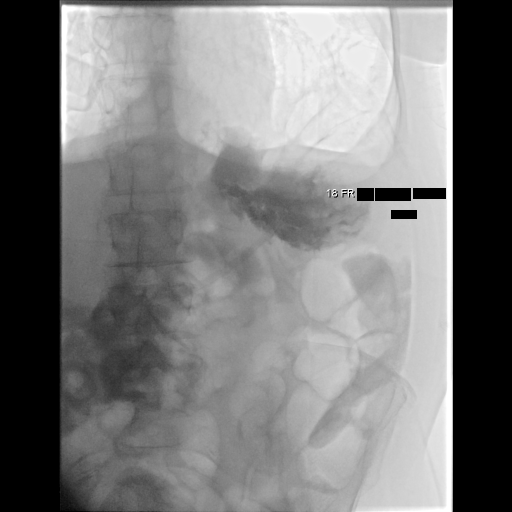
[im 2/4]
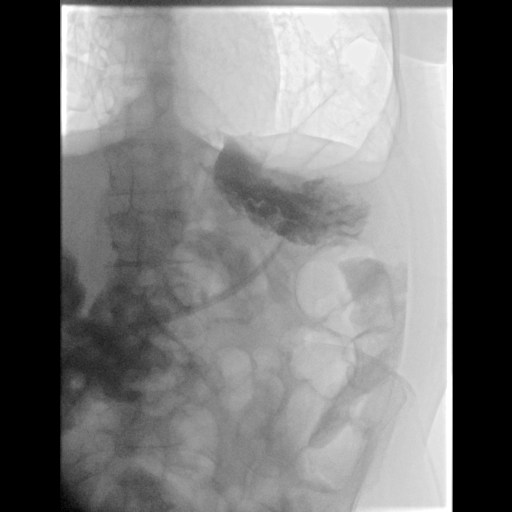
[im 2/4]
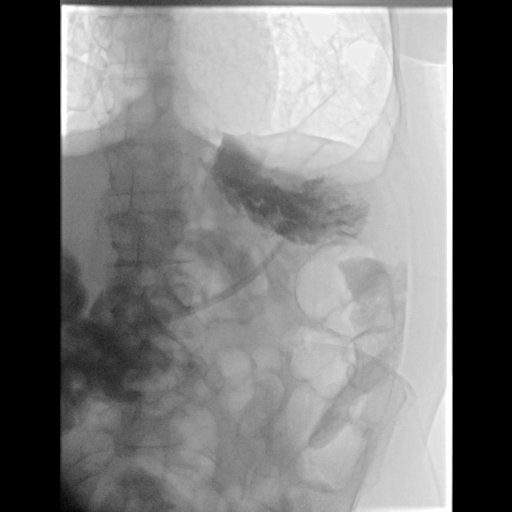
[im 2/4]
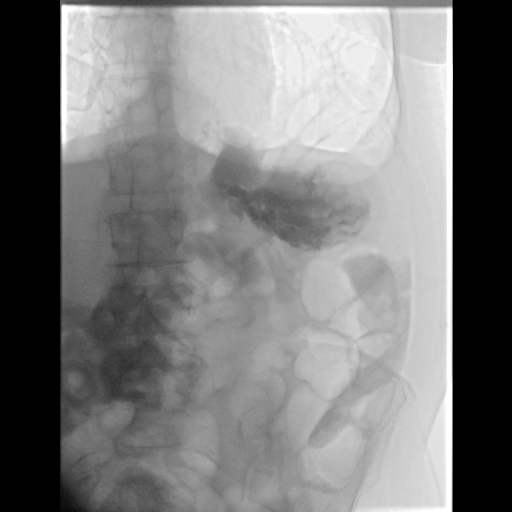
[im 3/4]
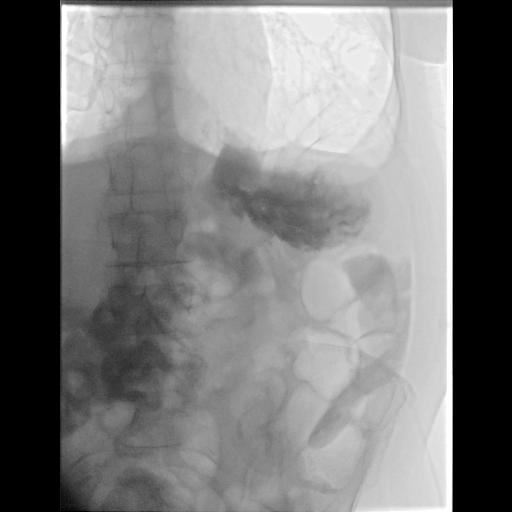
[im 4/4]
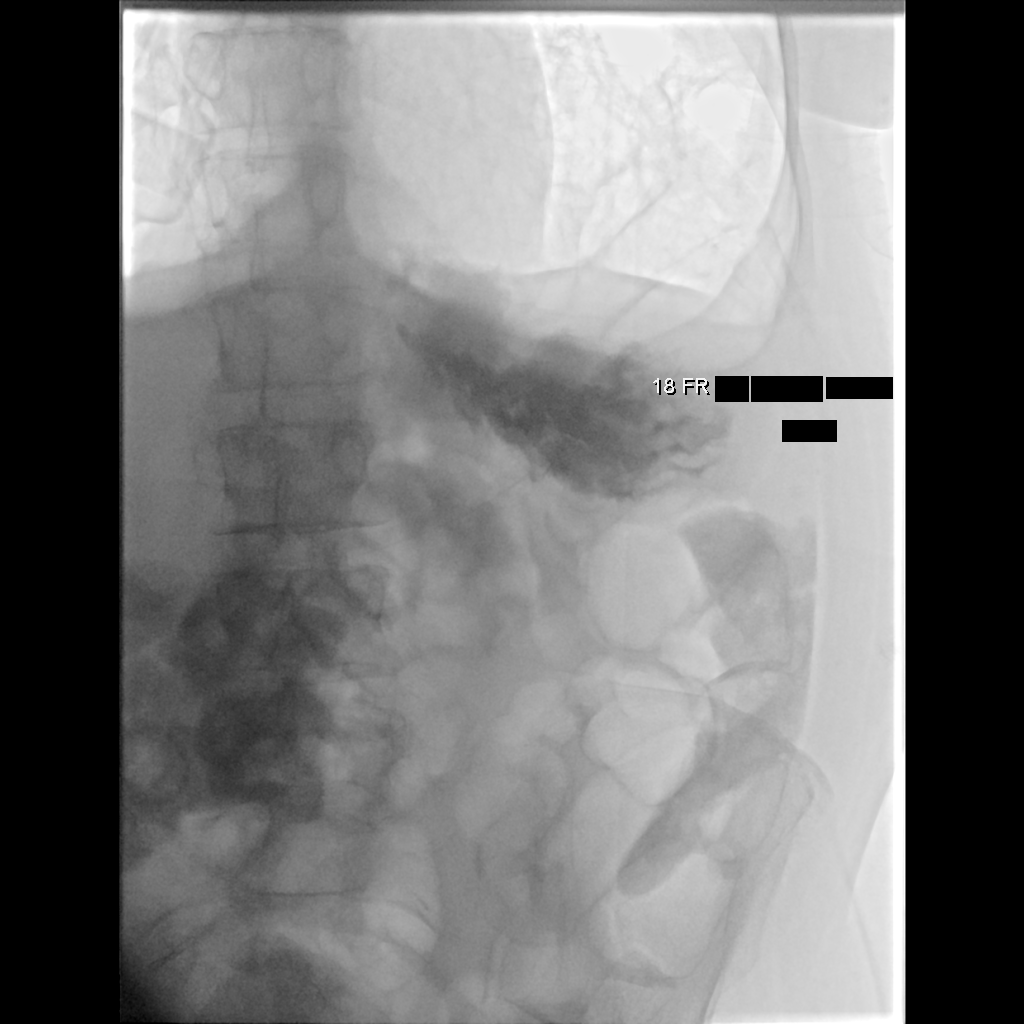

[14 of 18 positions shown; findings below may reference images not displayed]

MEDICATIONS:
None.

CONTRAST:  10mL OMNIPAQUE IOHEXOL 240 MG/ML SOLN - administered into
the gastric lumen

FLUOROSCOPY TIME:  18 seconds (1 mGy)

COMPLICATIONS:
None immediate.

PROCEDURE:
A timeout was performed prior to the initiation of the procedure.

The upper abdomen and external portion of the existing gastrostomy
tube was prepped and draped in the usual sterile fashion, and a
sterile drape was applied covering the operative field. Maximum
barrier sterile technique with sterile gowns and gloves were used
for the procedure. A timeout was performed prior to the initiation
of the procedure.

The site of the pre-existing gastrostomy tube was cannulated with a
new 18 French balloon inflatable gastrostomy tube. The balloon was
inflated with saline and pulled against the anterior inner lumen of
the stomach and the external disc was cinched.

Contrast was injected and a post procedural spot fluoroscopic image
was obtained confirming appropriate positioning and functionality of
the new gastrostomy tube. A dressing was applied. The patient
tolerated the procedure well without immediate postprocedural
complication.
IMPRESSION: Successful fluoroscopic guided replacement of a new 18-French
gastrostomy tube. The gastrostomy tube is ready for immediate use.

## 2023-07-09 IMAGING — DX DG CHEST 1V PORT
1 series · 1 of 1 positions shown · non-contrast
Comparison: Radiograph and CT 12/09/2020

CLINICAL DATA: Shortness of breath.

EXAM:
PORTABLE CHEST 1 VIEW

[chest]
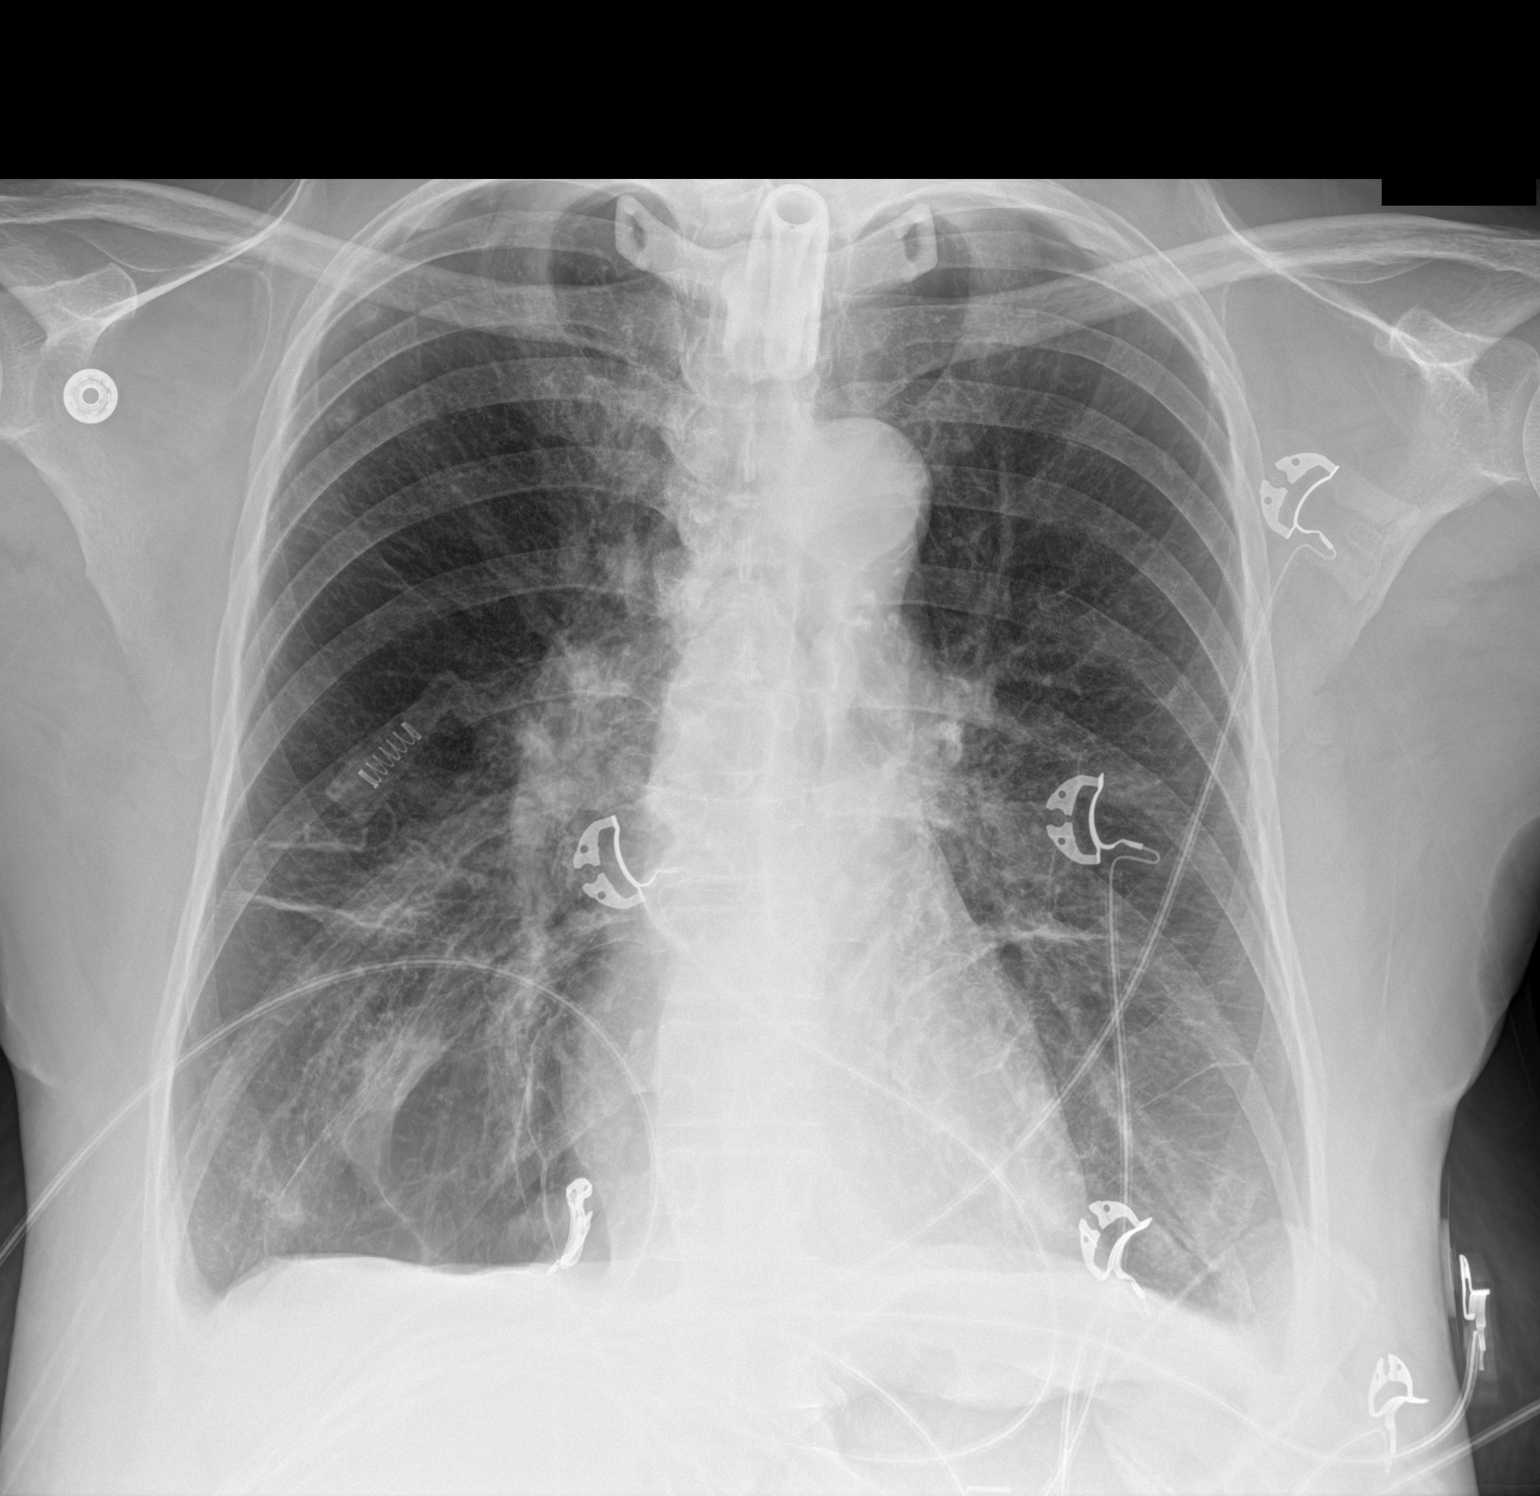

[1 of 1 positions shown; findings below may reference images not displayed]

FINDINGS: Tracheostomy tube tip projects over the thoracic inlet. Chronic
emphysema with bullous disease at the right lung base. Slight
increase in interstitial thickening. Bandlike atelectasis or
scarring in the right mid lung zone and left lower lobe. Calcified
mediastinal and hilar adenopathy with right upper lobe calcified
granulomas. The heart is normal in size with stable mediastinal
contours. Multiple EKG leads overlie the chest. No pneumothorax or
large pleural effusion.
IMPRESSION: 1. Slight increase in interstitial thickening may be pulmonary edema
or atypical infection.
2. Chronic emphysema with bullous disease at the right lung base.
Bandlike atelectasis or scarring in the right mid lung zone and left
lower lobe.

## 2023-07-10 IMAGING — DX DG CHEST 1V PORT
1 series · 1 of 1 positions shown · non-contrast
Comparison: Radiograph and CT yesterday.

CLINICAL DATA: Chest pain. Pain onset after being discharged from
the emergency room today.

EXAM:
PORTABLE CHEST 1 VIEW

[chest]
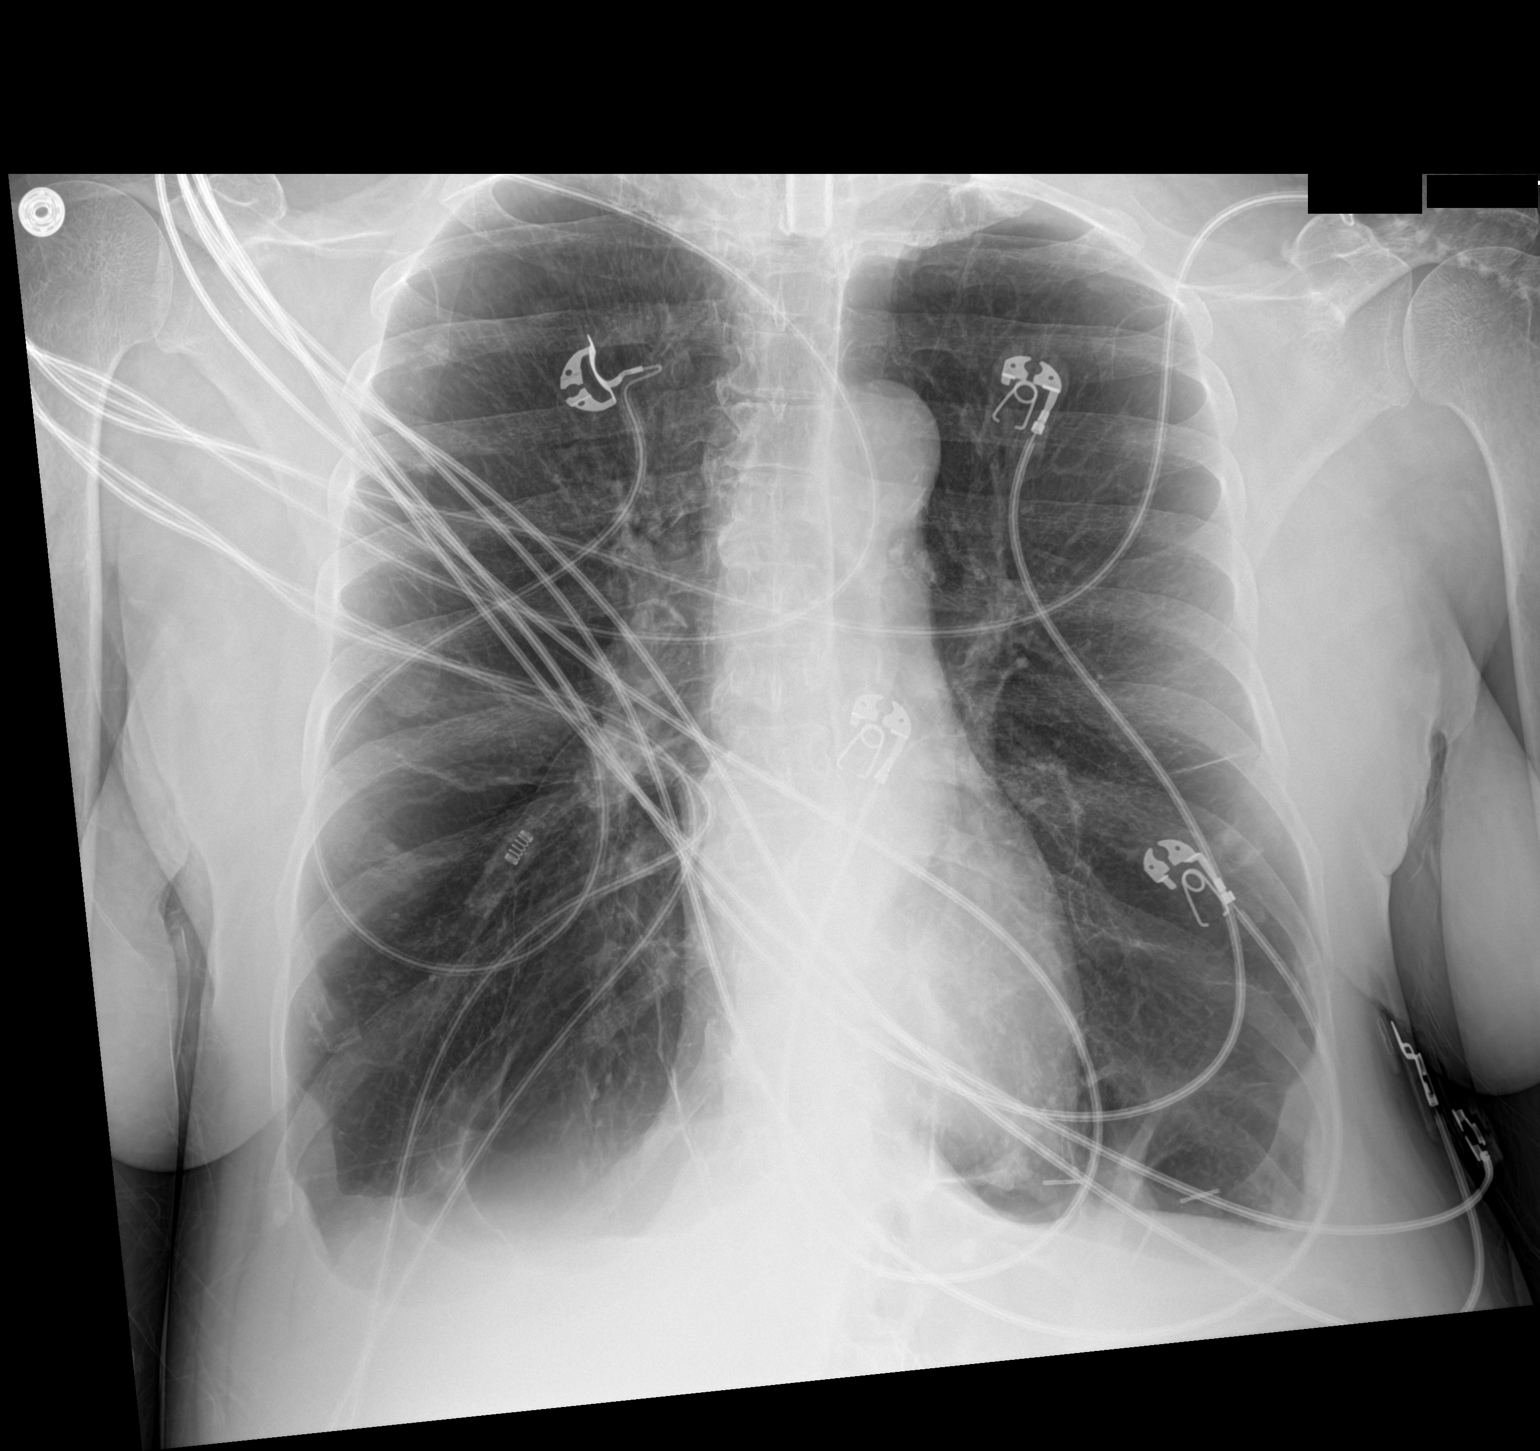

[1 of 1 positions shown; findings below may reference images not displayed]

FINDINGS: Tracheostomy tube tip at the thoracic inlet. Emphysema. There is
increased hyperinflation from prior exam. Basilar bullous disease
again seen. No pneumothorax, pulmonary edema, or acute airspace
disease. Calcified granuloma again seen in the right upper lung.
IMPRESSION: Chronic but increased hyperinflation since yesterday. No other new
findings. Advanced emphysema.

## 2023-07-11 IMAGING — DX DG CHEST 1V PORT
1 series · 1 of 1 positions shown · non-contrast
Comparison: Chest x-rays dated 06/09/2021 and 06/08/2021. Chest CT
dated 06/10/2021.

CLINICAL DATA: Shortness of breath.

EXAM:
PORTABLE CHEST 1 VIEW

[chest ap]
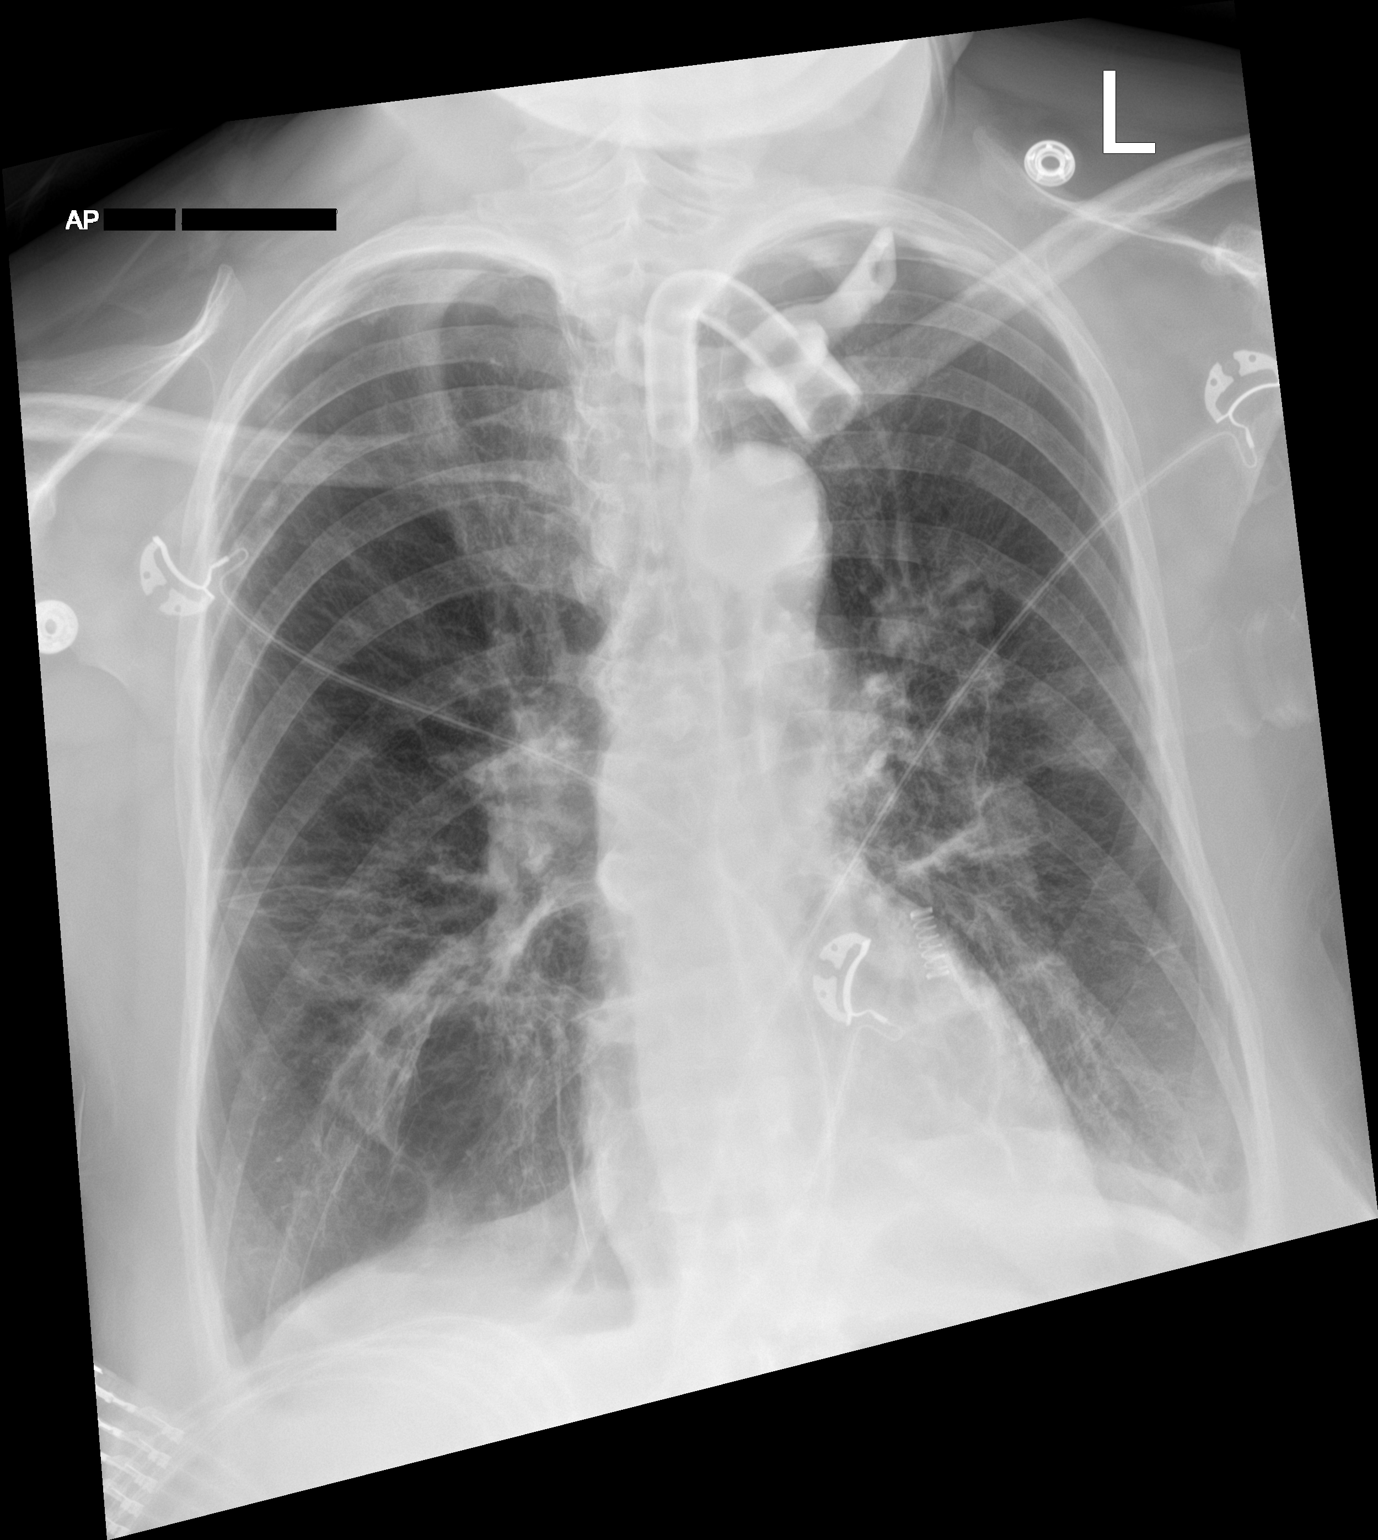

[1 of 1 positions shown; findings below may reference images not displayed]

FINDINGS: Heart size and mediastinal contours are stable. Tracheostomy tube
appears appropriately positioned in the midline. Chronic scarring
and emphysematous changes appear stable. No new lung findings seen.
No pleural effusion or pneumothorax is seen.
IMPRESSION: 1. No active disease. No evidence of pneumonia or pulmonary edema.
2. Stable chronic scarring and emphysematous changes.
3. Tracheostomy tube appears appropriately positioned in the
midline.

## 2023-07-11 IMAGING — CT CT ANGIO CHEST
2 of 6 series · 18 of 36 positions shown · IV contrast (agent unspecified)
Comparison: CT Chest, Abdomen, and Pelvis 06/08/2021, and earlier.

CLINICAL DATA: 55-year-old female with acute chest pain.

EXAM:
CT ANGIOGRAPHY CHEST WITH CONTRAST
TECHNIQUE: Multidetector CT imaging of the chest was performed using the
standard protocol during bolus administration of intravenous
contrast. Multiplanar CT image reconstructions and MIPs were
obtained to evaluate the vascular anatomy.

[Series 11: pe thins · axial · 0.74mm/px · z∈[-482,-199]mm · 17 of 450 slices shown]
[im 23/450  lung]
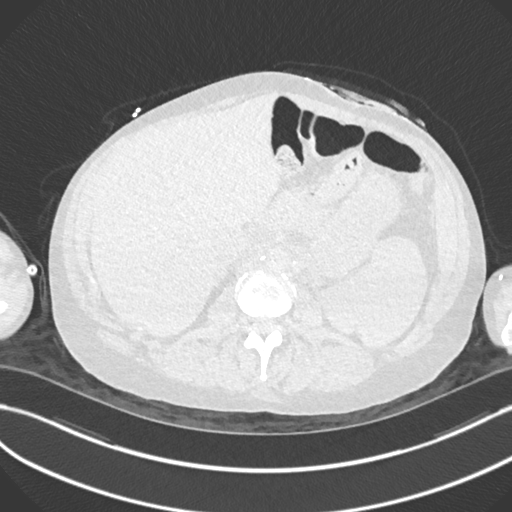
[im 45/450  mediastinal]
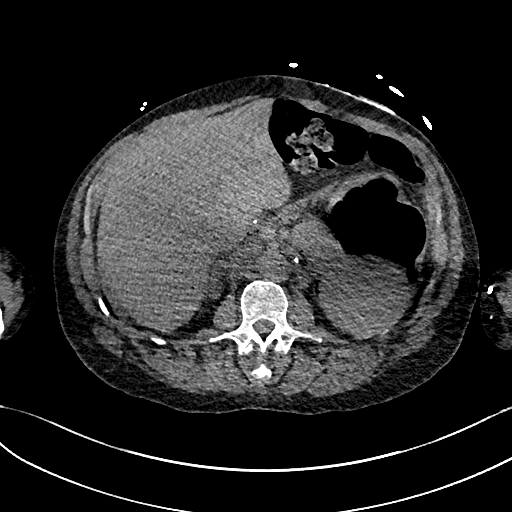
[im 68/450  lung]
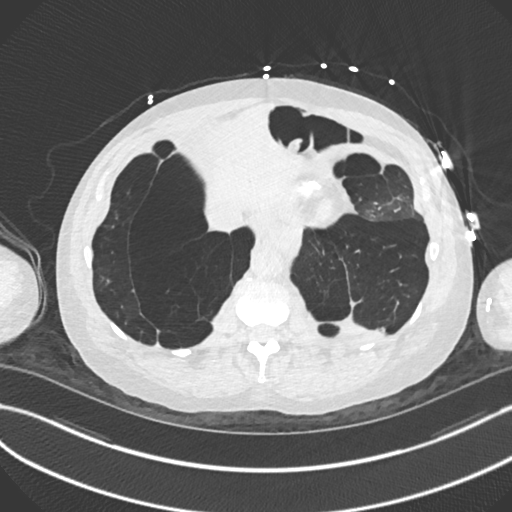
[im 90/450  mediastinal]
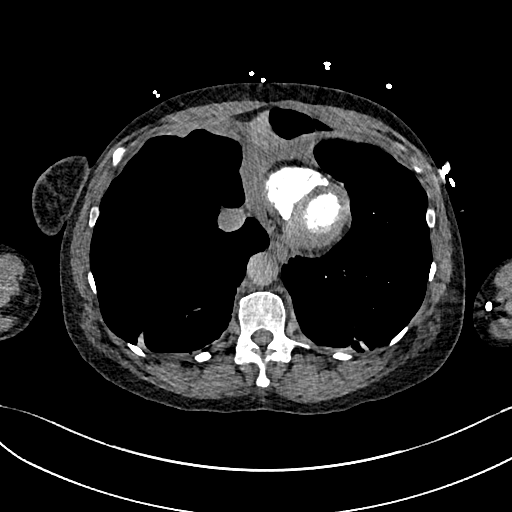
[im 135/450  lung]
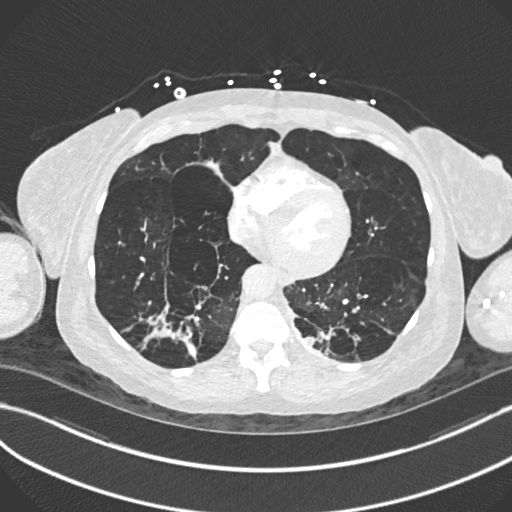
[im 158/450  mediastinal]
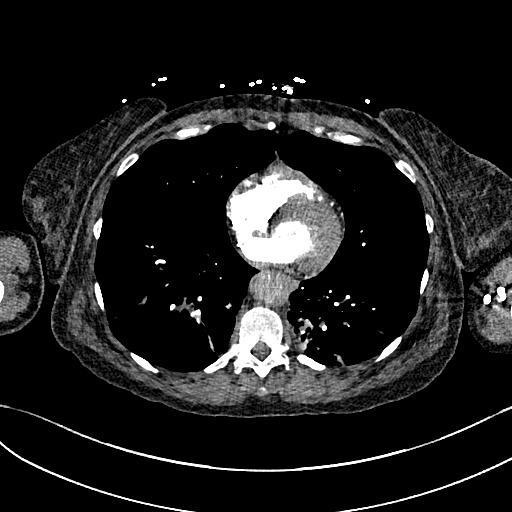
[im 180/450  lung]
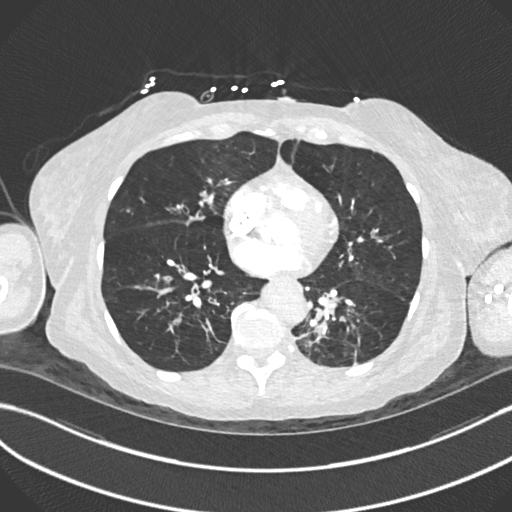
[im 203/450  mediastinal]
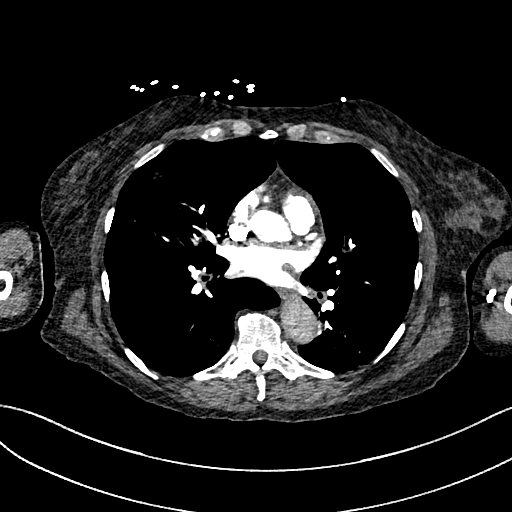
[im 225/450  lung]
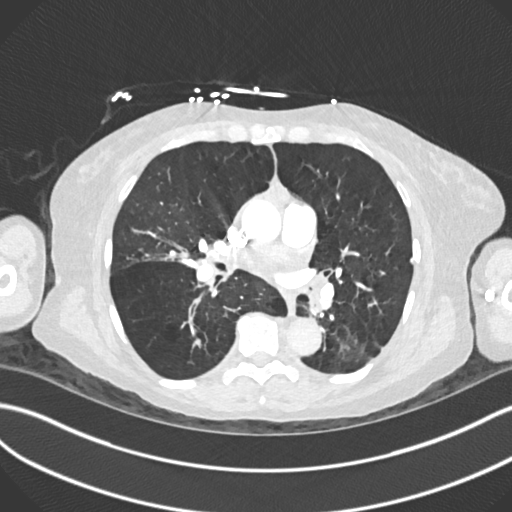
[im 247/450  mediastinal]
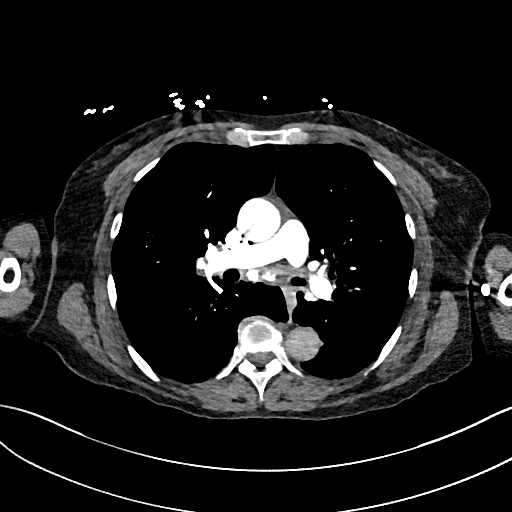
[im 270/450  lung]
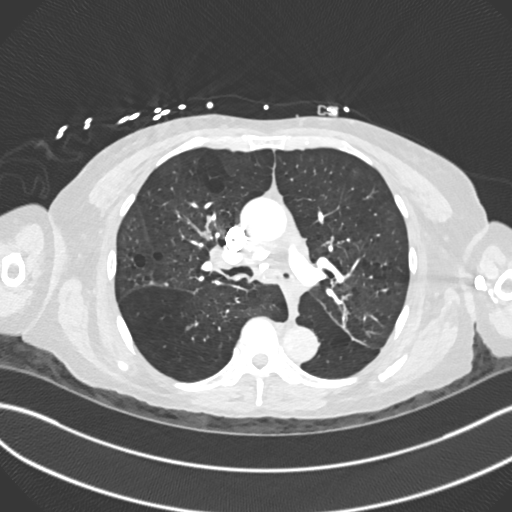
[im 292/450  mediastinal]
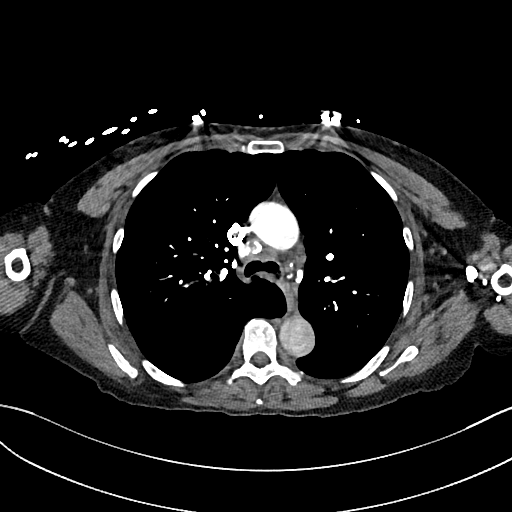
[im 315/450  lung]
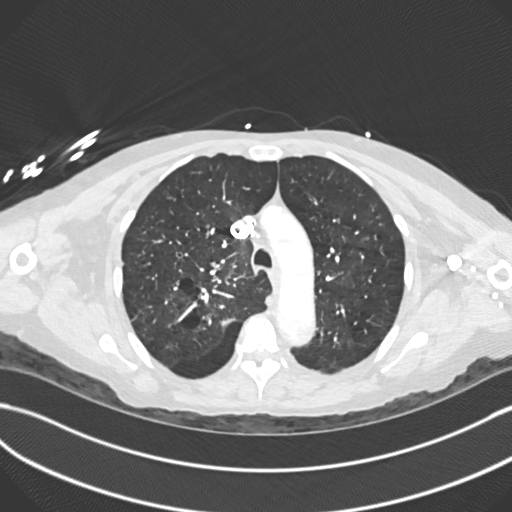
[im 360/450  mediastinal]
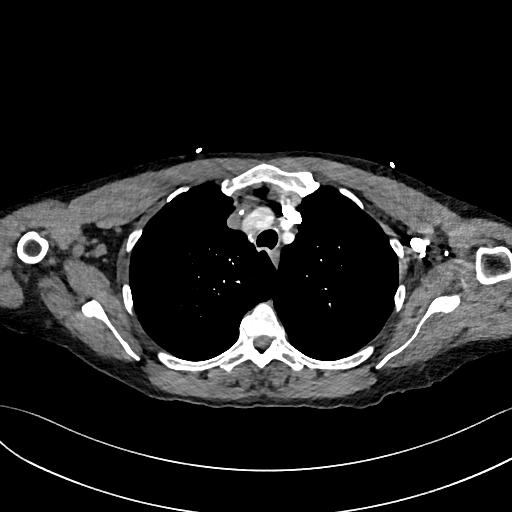
[im 382/450  lung]
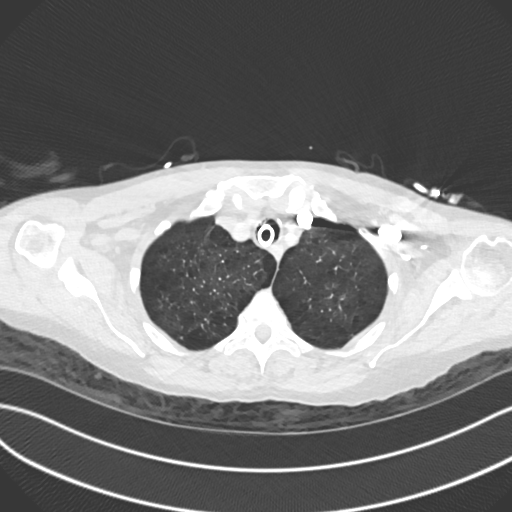
[im 405/450  mediastinal]
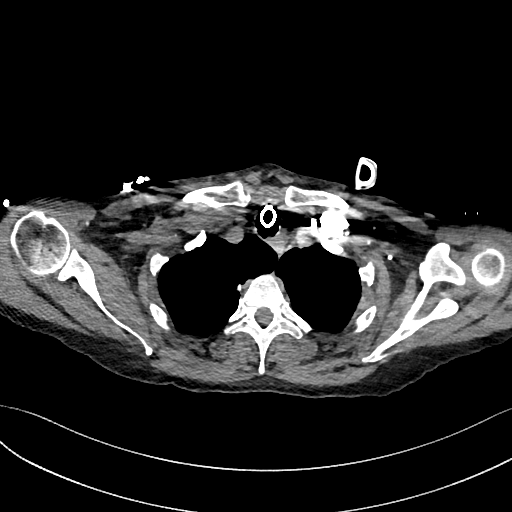
[im 427/450  lung]
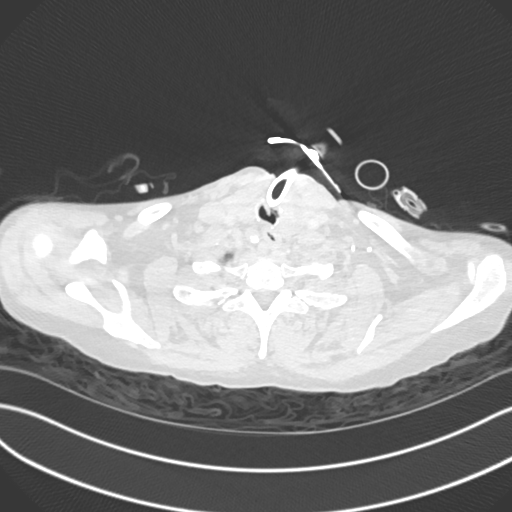

[Series 12: pe 2mm cor · coronal · 0.66mm/px · 1 of 151 slices shown]
[im 76/151  mediastinal]
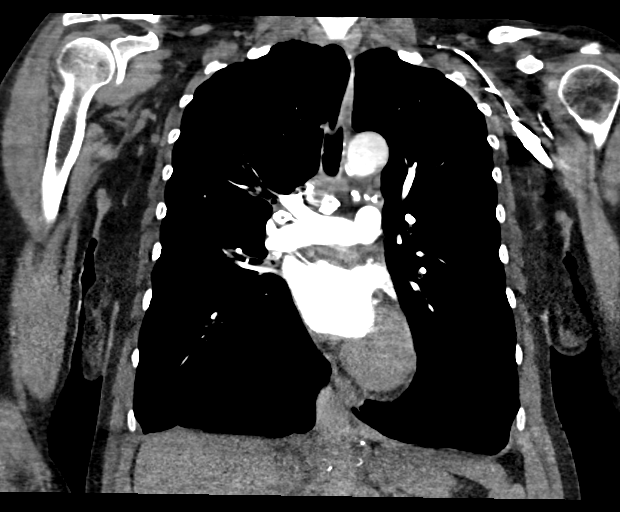

[18 of 36 positions shown; findings below may reference images not displayed]

RADIATION DOSE REDUCTION: This exam was performed according to the
departmental dose-optimization program which includes automated
exposure control, adjustment of the mA and/or kV according to
patient size and/or use of iterative reconstruction technique.

CONTRAST:  50mL OMNIPAQUE IOHEXOL 350 MG/ML SOLN
FINDINGS: Cardiovascular: Excellent contrast bolus timing in the pulmonary
arterial tree.

No focal filling defect identified in the pulmonary arteries to
suggest acute pulmonary embolism.

Stable mild tortuosity of the thoracic aorta. No cardiomegaly or
pericardial effusion. Little contrast in the descending aorta.

Calcified coronary artery atherosclerosis.

Mediastinum/Nodes: Widespread calcified postinflammatory lymph
nodes, chronically increased hilar nodal tissue. No interval change.

Lungs/Pleura: Stable tracheostomy. Small volume retained secretions
in the trachea around the tube. Distal trachea and carina remain
patent although there is partial collapse of the left mainstem
bronchus on series 10, image 63. And similar additional severe
narrowing of the left lower lobe bronchus on series 10, image 80.
And right lower lobe subsegmental and distal airways also appear
newly opacified.

Underlying chronic lung disease with advanced emphysema. Multiple
peripheral partially calcified pulmonary nodules appears stable and
postinflammatory.

Increased bilateral lower lobe peribronchial streaky, which appears
to represent a combination of atelectasis and inflammation. No
pleural effusion. No pneumothorax.

Upper Abdomen: Stable, negative visible upper abdomen.

Musculoskeletal: No acute osseous abnormality identified.

Review of the MIP images confirms the above findings.
IMPRESSION: 1. No evidence of acute pulmonary embolus.

2. Pronounced chronic Emphysema ([id number].G) and chronic post
granulomatous changes in the chest.
Chronic tracheostomy. Trachea is patent but there is significant
acute airway narrowing and opacification of the left mainstem
bronchus, left lower lobar bronchus, and additional bilateral
segmental lower lobe airways. And associated increased combined
lower lobe atelectasis and inflammation. No pleural effusion.

3. Calcified coronary artery atherosclerosis. Aortic Atherosclerosis
([id number].N).

## 2023-07-12 IMAGING — DX DG CHEST 1V PORT
1 series · 2 of 2 positions shown · non-contrast
Comparison: Radiograph 06/10/2021

CLINICAL DATA: Shortness of breath, acute, respiratory failure

EXAM:
PORTABLE CHEST 1 VIEW

[Series 1: chest · 0.14mm/px · 2 of 2 slices shown]
[im 1/2]
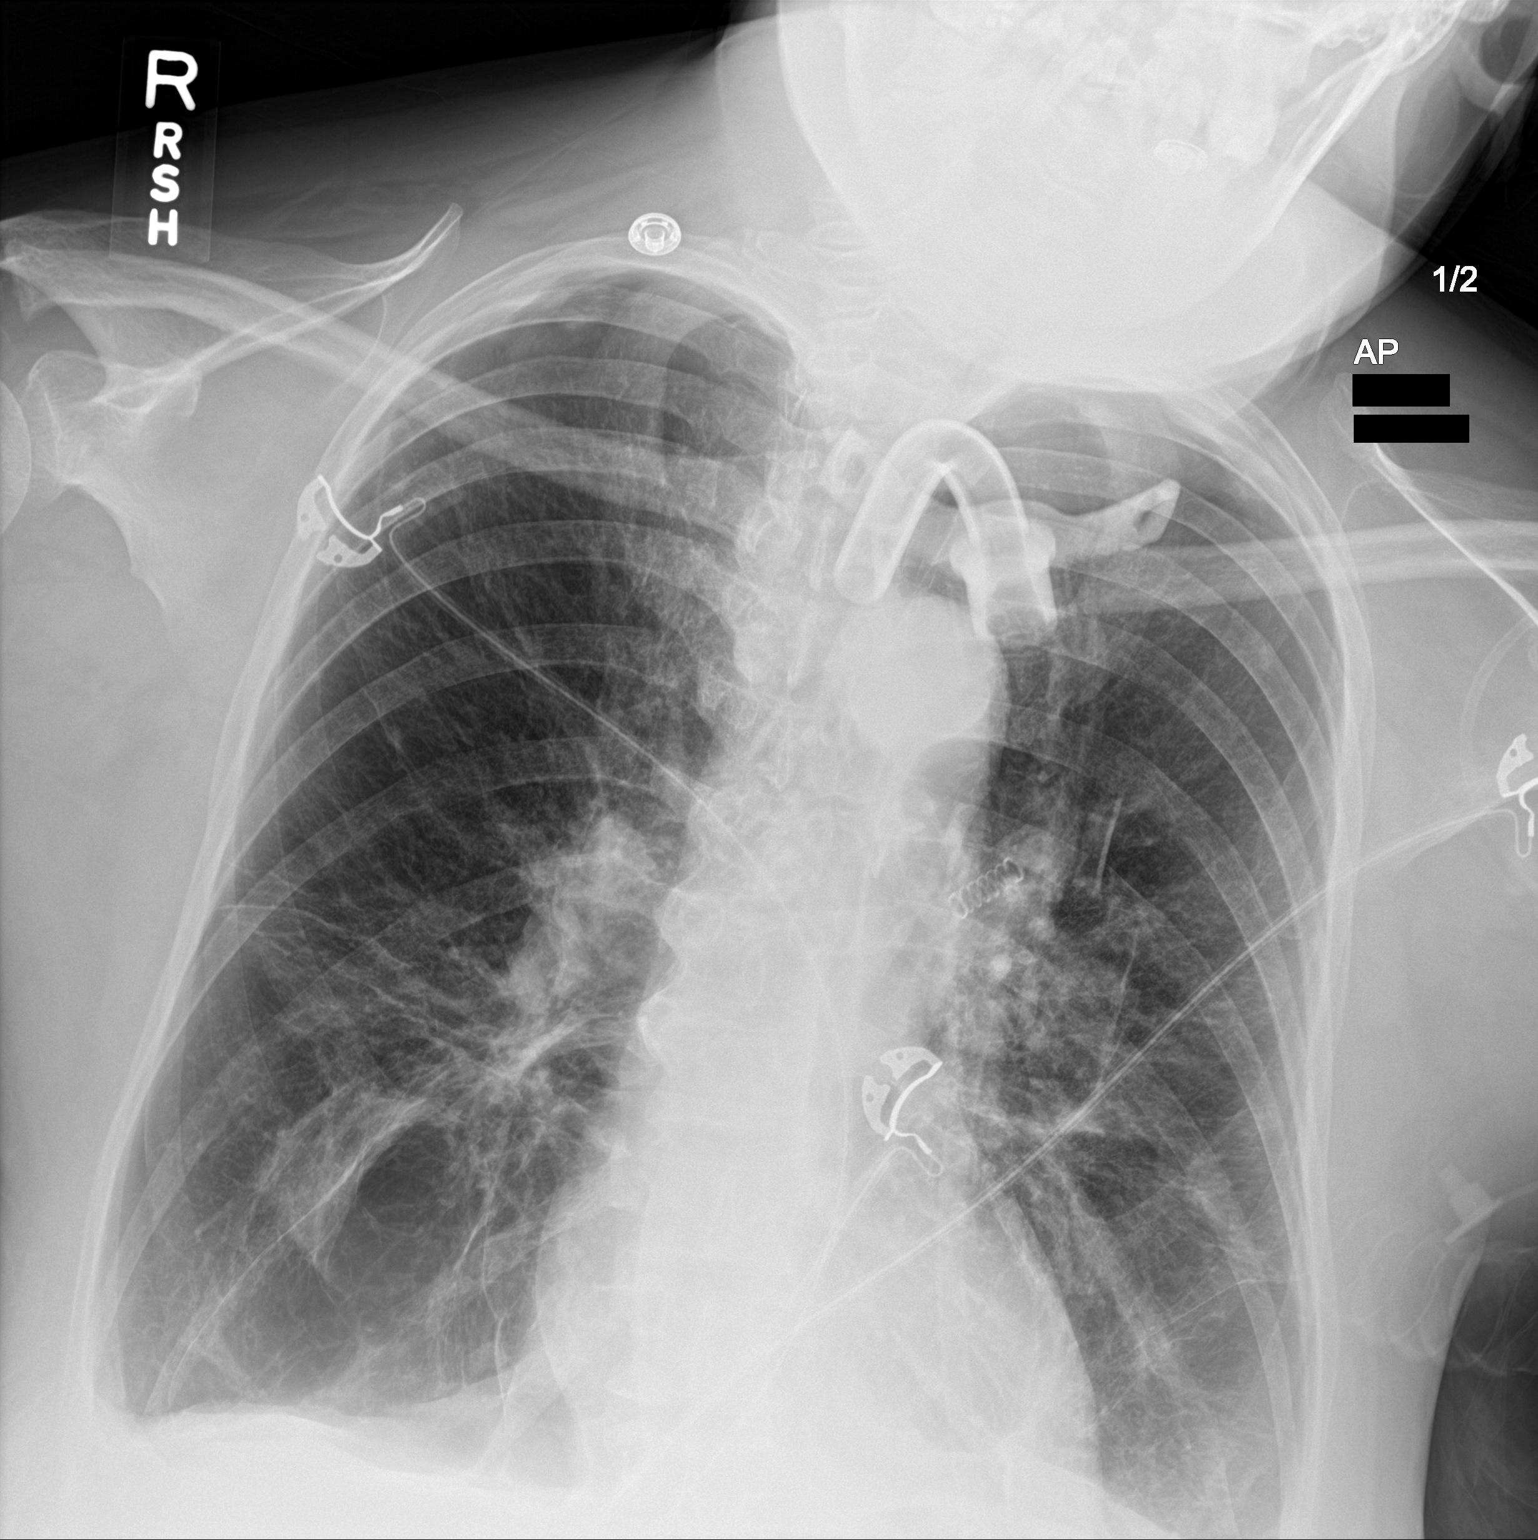
[im 2/2]
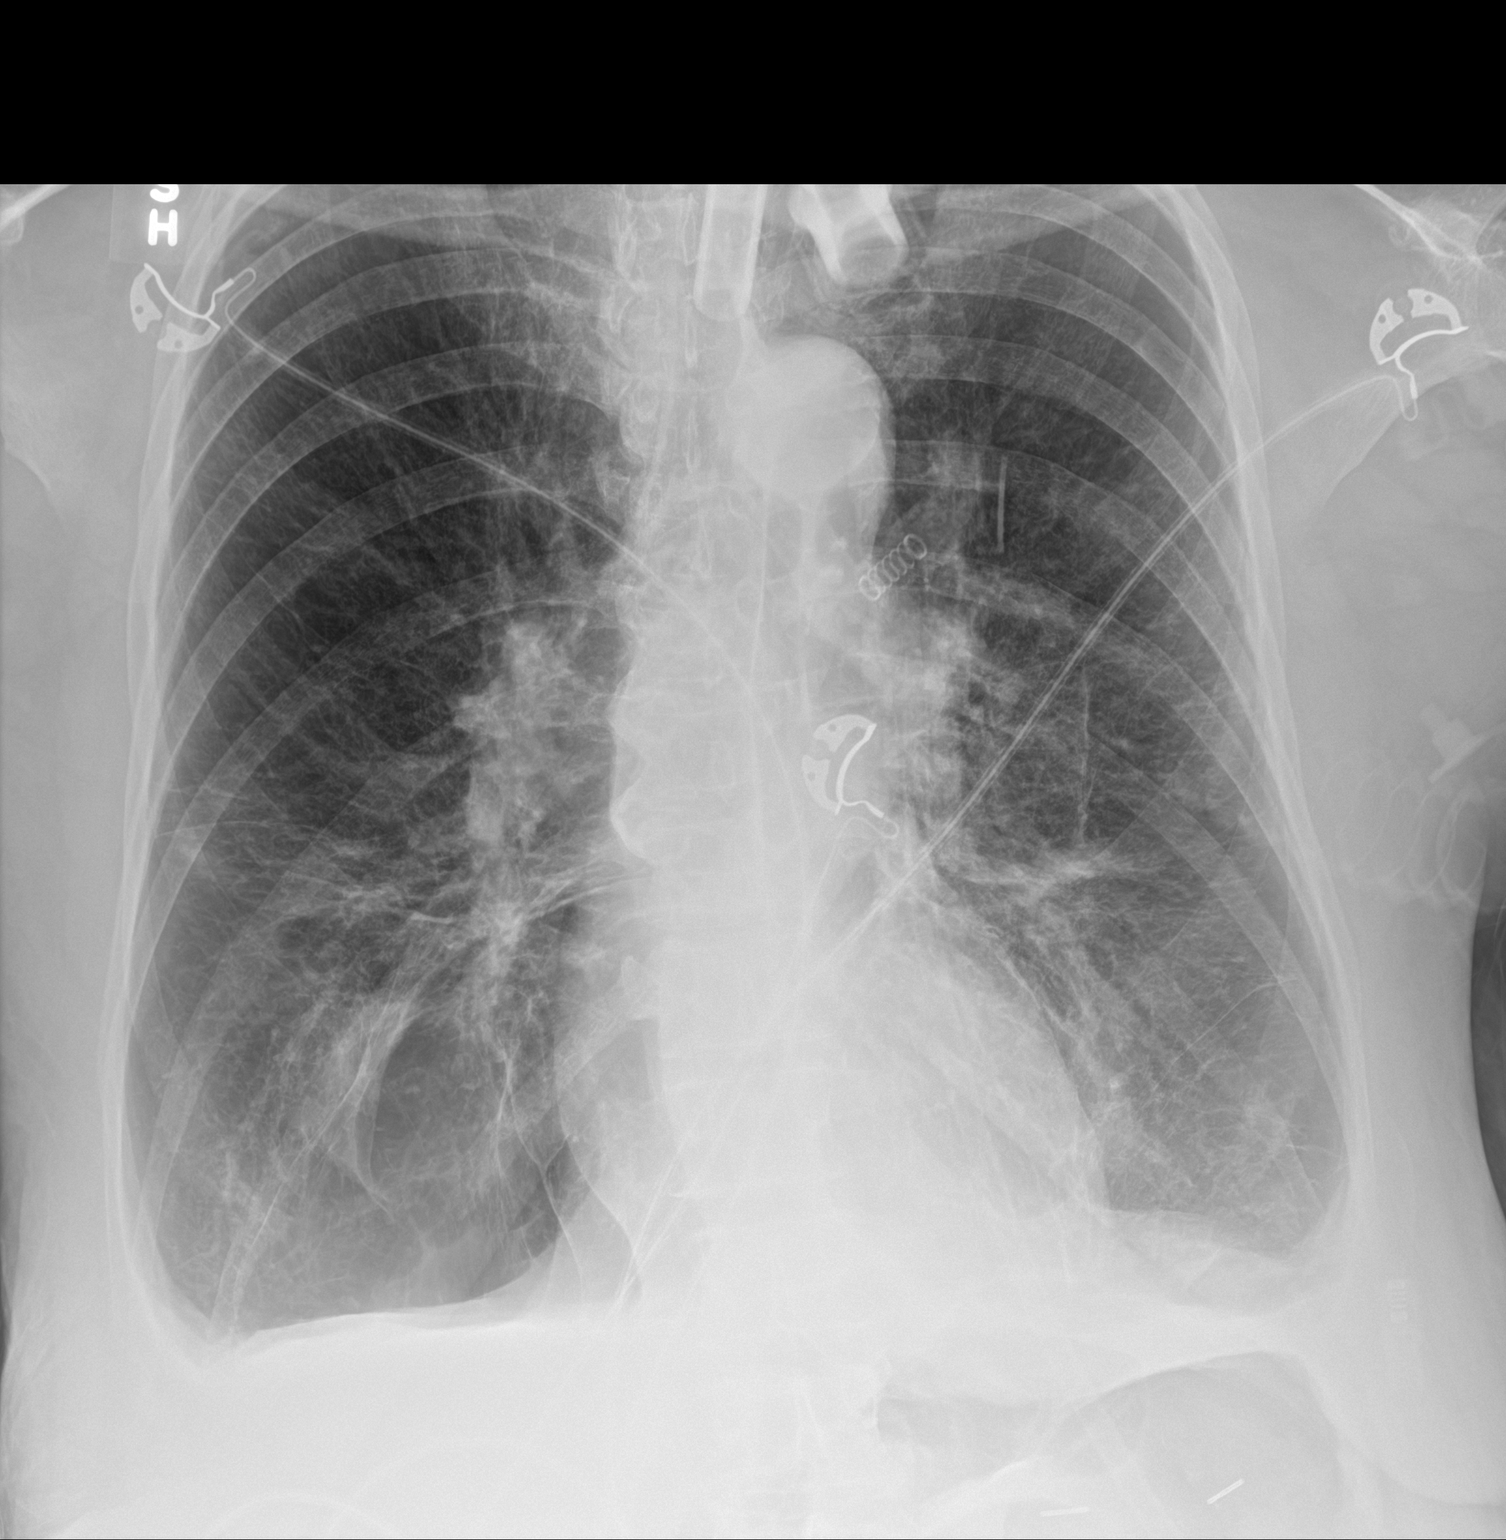

[2 of 2 positions shown; findings below may reference images not displayed]

FINDINGS: Tracheostomy tube overlies the midthoracic trachea. Unchanged
cardiomediastinal silhouette. Emphysema. Unchanged bibasilar pleural
thickening and lung scarring. Increased diffuse interstitial
opacities. No pneumothorax. Bones are unchanged.
IMPRESSION: Increased interstitial opacities, likely interstitial edema.

## 2023-07-12 IMAGING — DX DG CHEST 1V PORT
1 series · 1 of 1 positions shown · non-contrast
Comparison: 06/11/2021 at 6969 hours

CLINICAL DATA: Dyspnea

EXAM:
PORTABLE CHEST 1 VIEW

[chest]
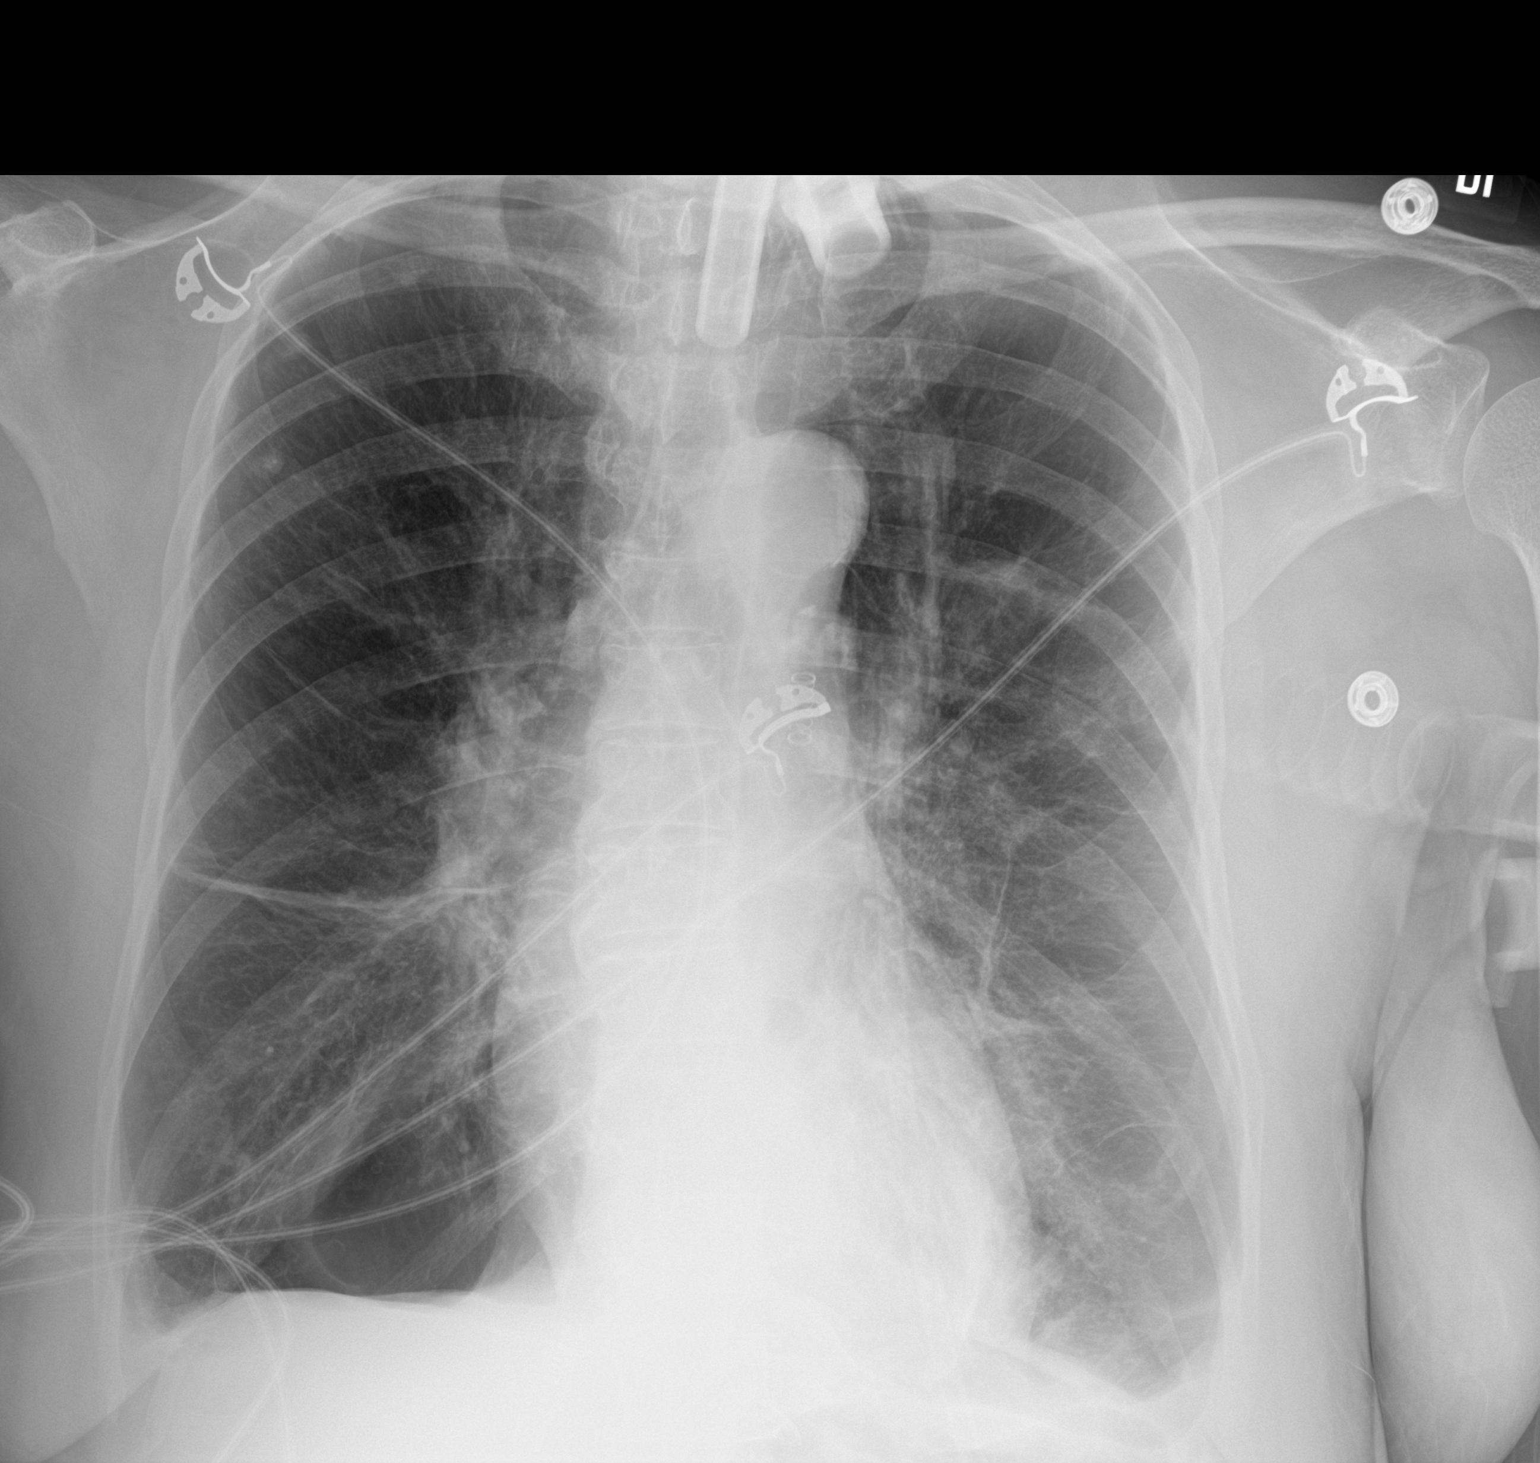

[1 of 1 positions shown; findings below may reference images not displayed]

FINDINGS: Tracheostomy tube remains in place. Stable cardiomediastinal
contours. Advanced emphysematous changes throughout both lungs.
Similar appearance of increased interstitial markings, most
pronounced within the left lung base. Chronic bibasilar pleural
thickening and scarring. No large pleural fluid collection. No
pneumothorax. Scattered calcified granulomas.
IMPRESSION: Stable radiographic appearance of the chest with increased
interstitial markings within the left lung base.

## 2023-07-13 IMAGING — DX DG CHEST 1V PORT
1 series · 2 of 2 positions shown · non-contrast
Comparison: 06/11/2021

CLINICAL DATA: Tracheostomy present.  COPD.  Respiratory failure.

EXAM:
PORTABLE CHEST 1 VIEW

[Series 1: chest · 0.14mm/px · 2 of 2 slices shown]
[im 1/2]
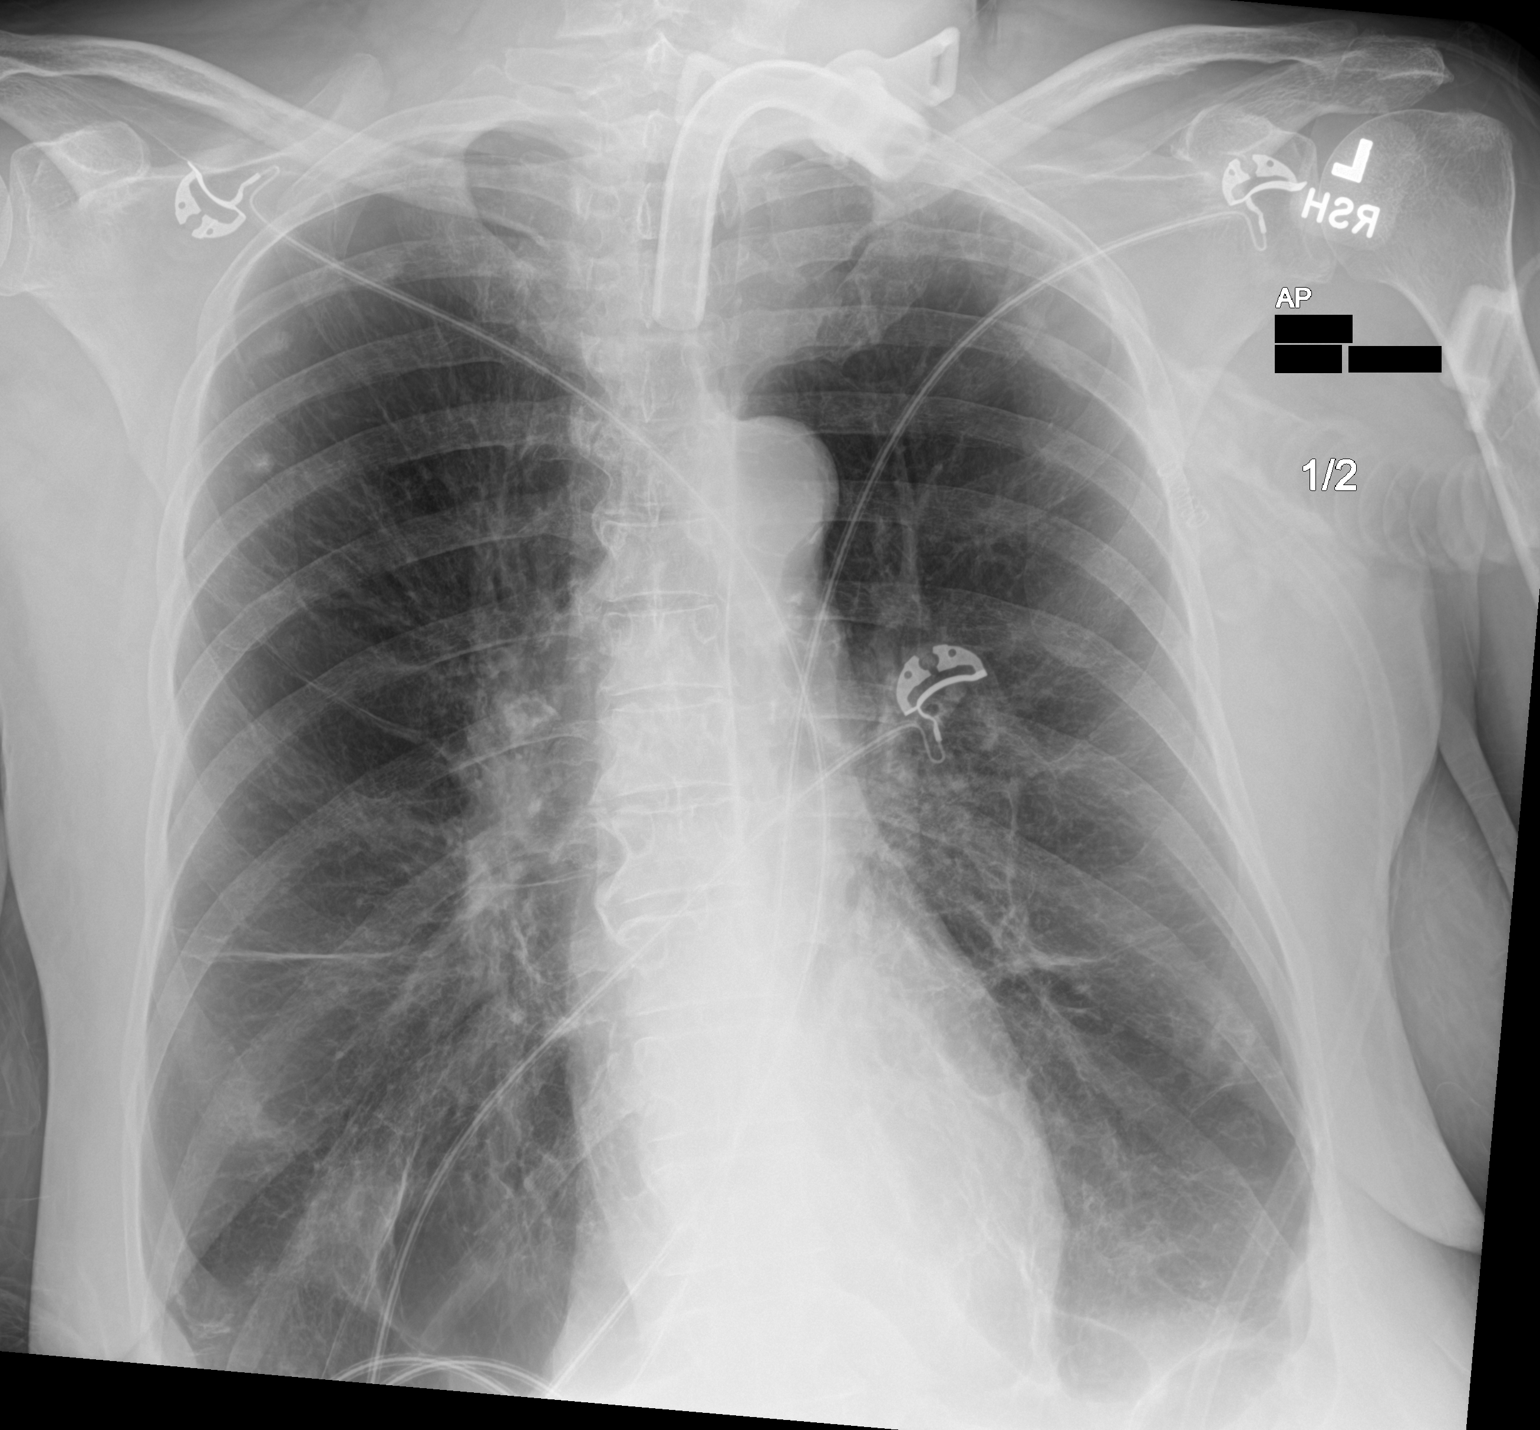
[im 2/2]
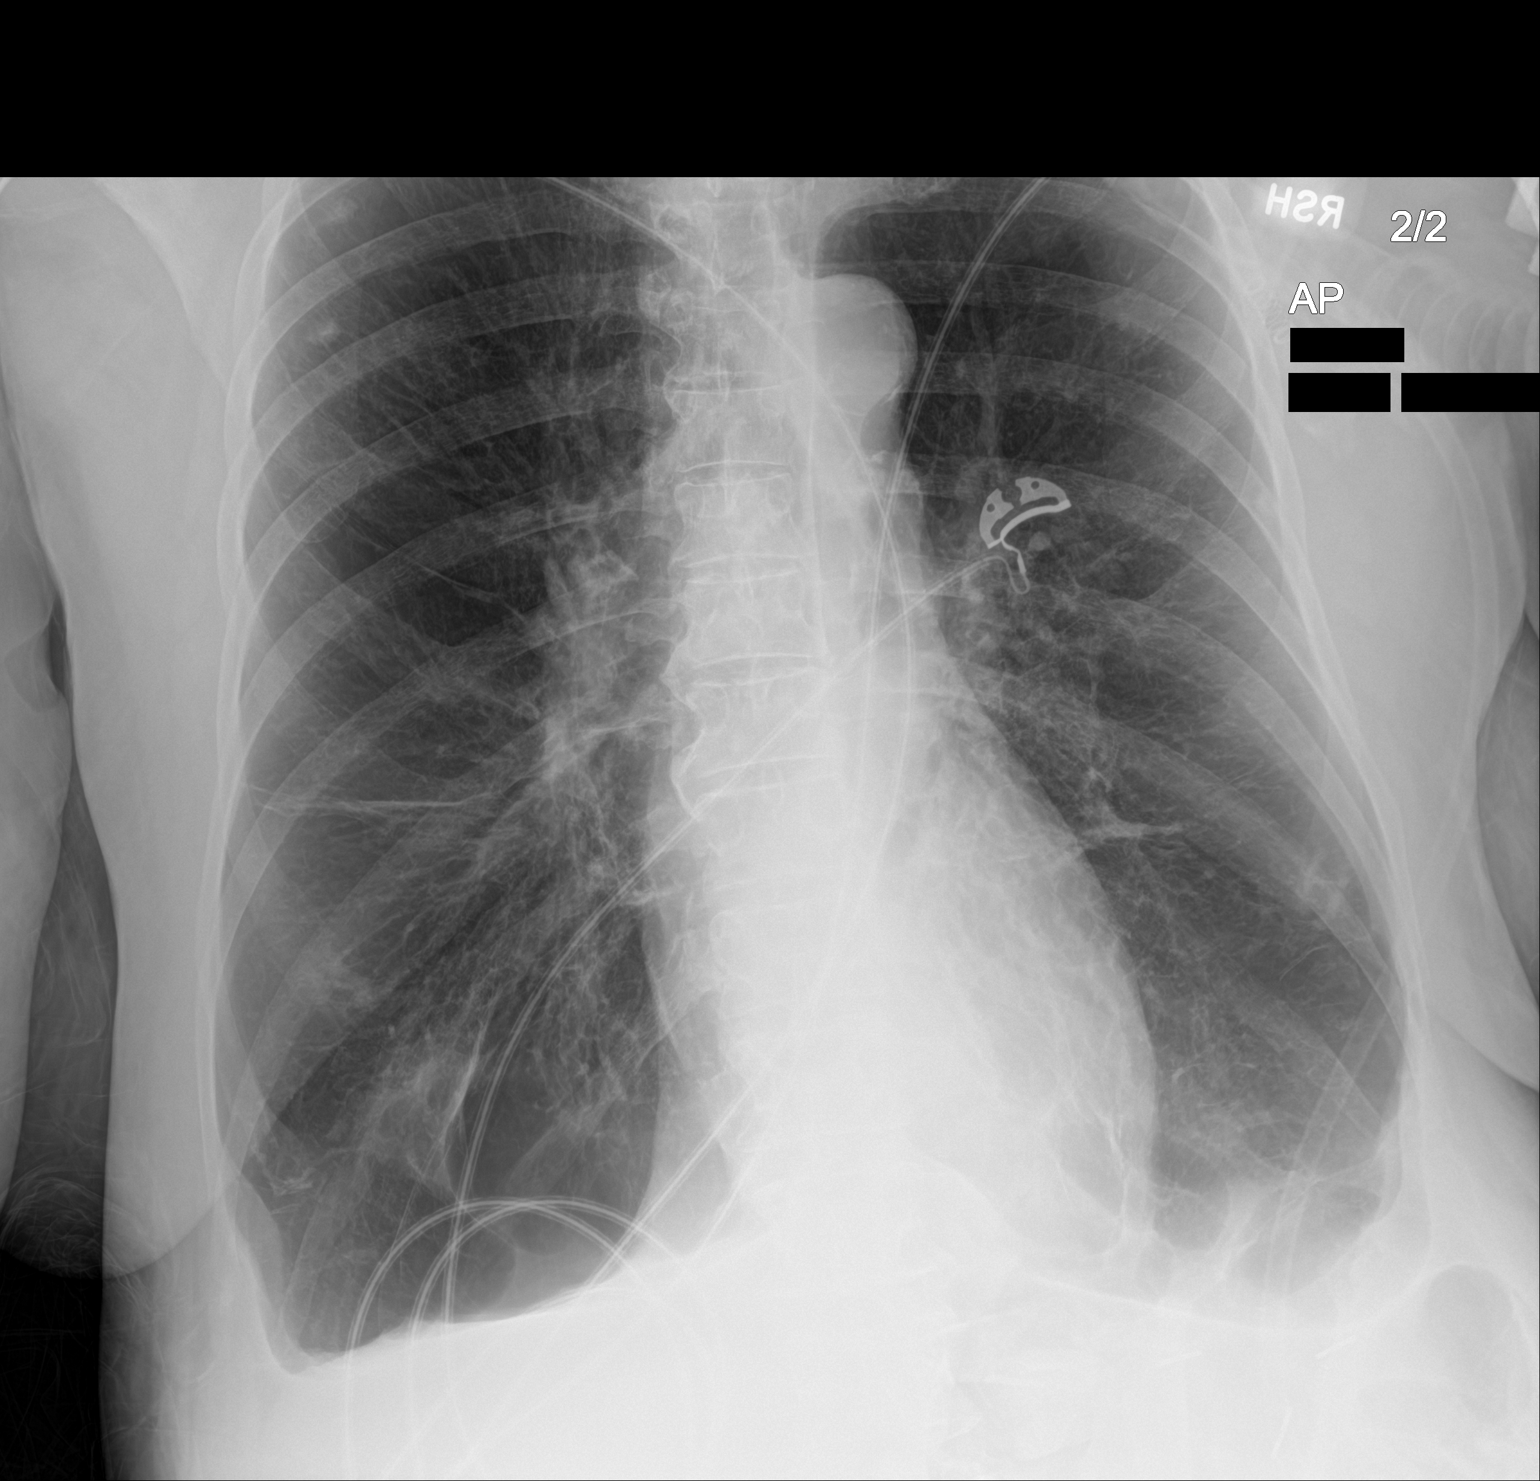

[2 of 2 positions shown; findings below may reference images not displayed]

FINDINGS: Tracheostomy device is present. Background changes of emphysema.
Improved aeration left lung base. Chronic blunting of the
costophrenic angles. Areas of scarring and calcified granulomas are
again identified. No new consolidation. Stable cardiomediastinal
contours. No pneumothorax.
IMPRESSION: Improved aeration left lung base.

## 2023-07-13 IMAGING — DX DG ABD PORTABLE 1V
1 series · 1 of 1 positions shown · non-contrast
Comparison: 12/25/2020

CLINICAL DATA: Feeding tube placement

EXAM:
PORTABLE ABDOMEN - 1 VIEW

[abdomen]
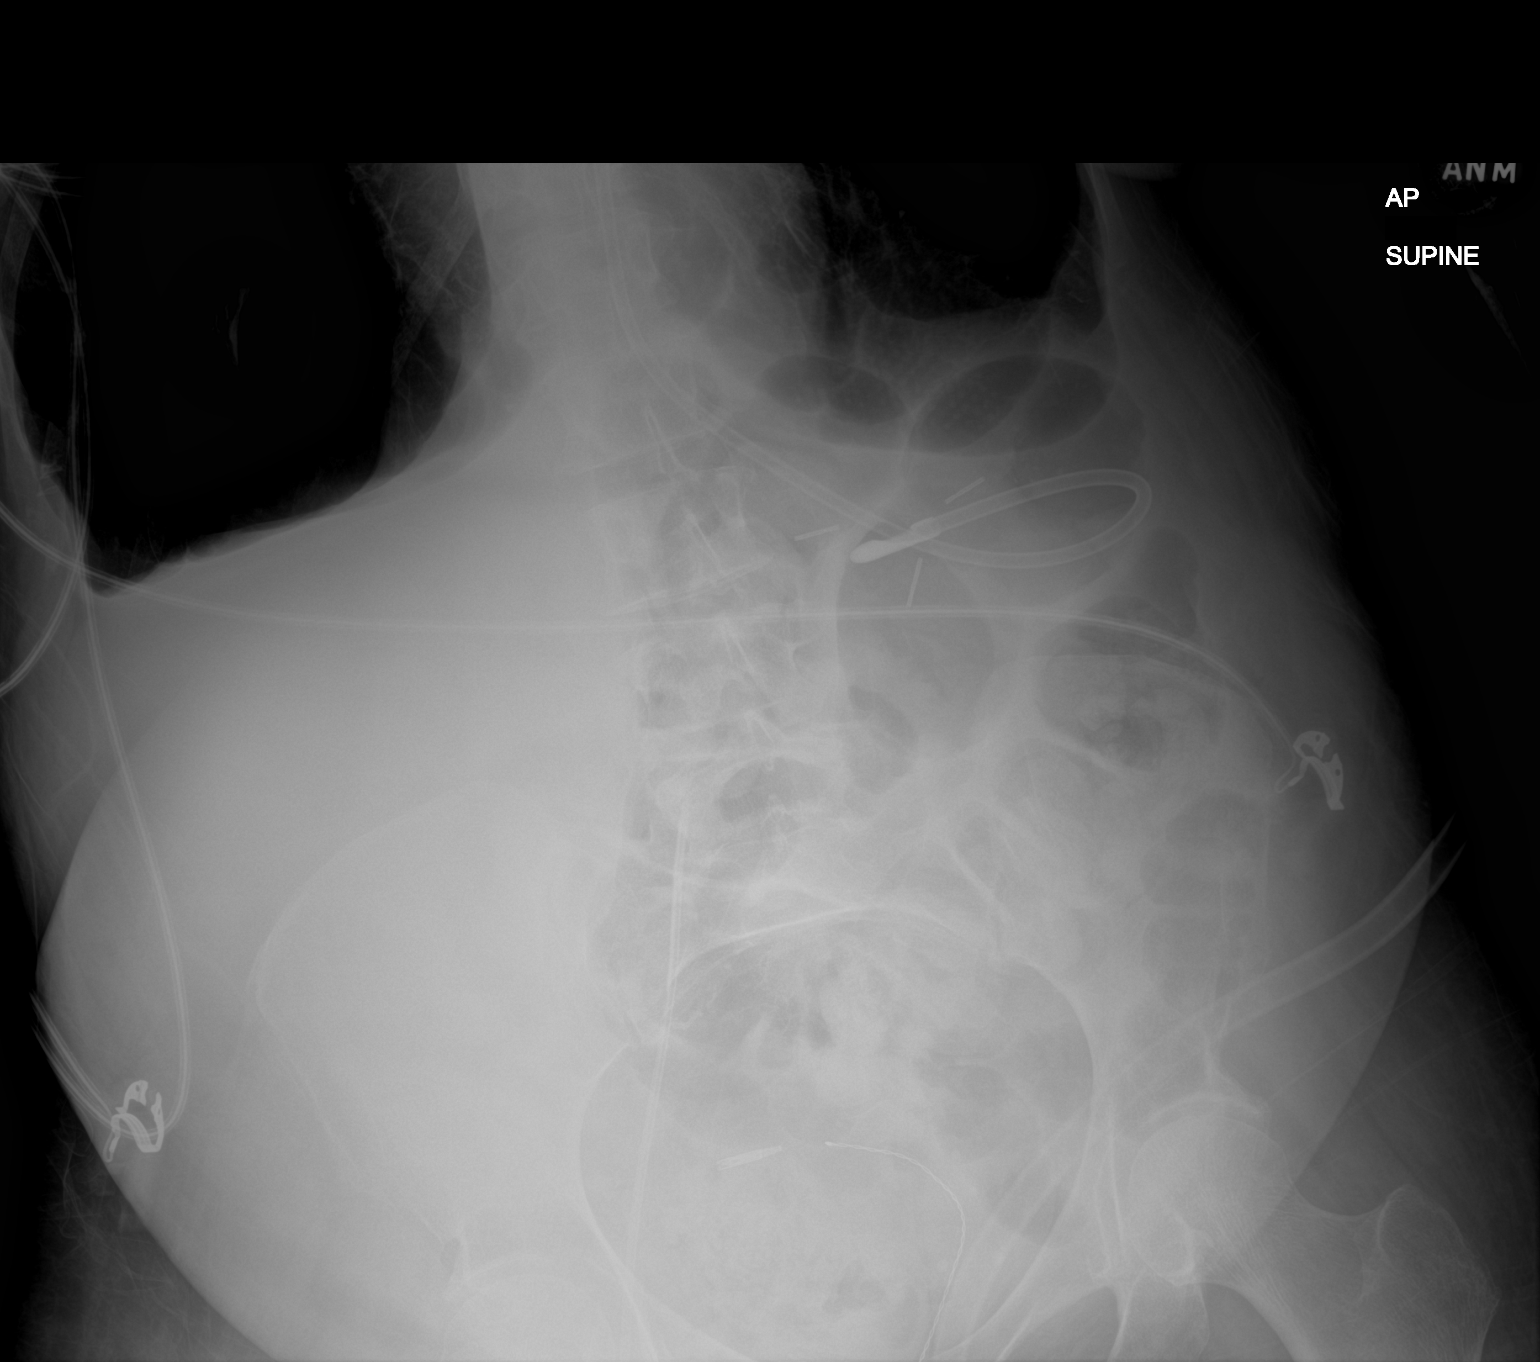

[1 of 1 positions shown; findings below may reference images not displayed]

FINDINGS: Feeding tube enters the stomach with the tip in the proximal body of
the stomach.

Normal bowel gas pattern. Right femoral central venous catheter tip
in the IVC overlying the L5 region. Probable Foley catheter in the
bladder. Surgical clip in the pelvis.
IMPRESSION: Feeding tube coiled in the stomach with the tip in the proximal body
of the stomach.

## 2023-07-14 IMAGING — DX DG CHEST 1V PORT
2 series · 2 of 2 positions shown · non-contrast
Comparison: the previous day's study

CLINICAL DATA: Vent/trch, copd

EXAM:
PORTABLE CHEST - 1 VIEW

[chest ap (1 of 2)]
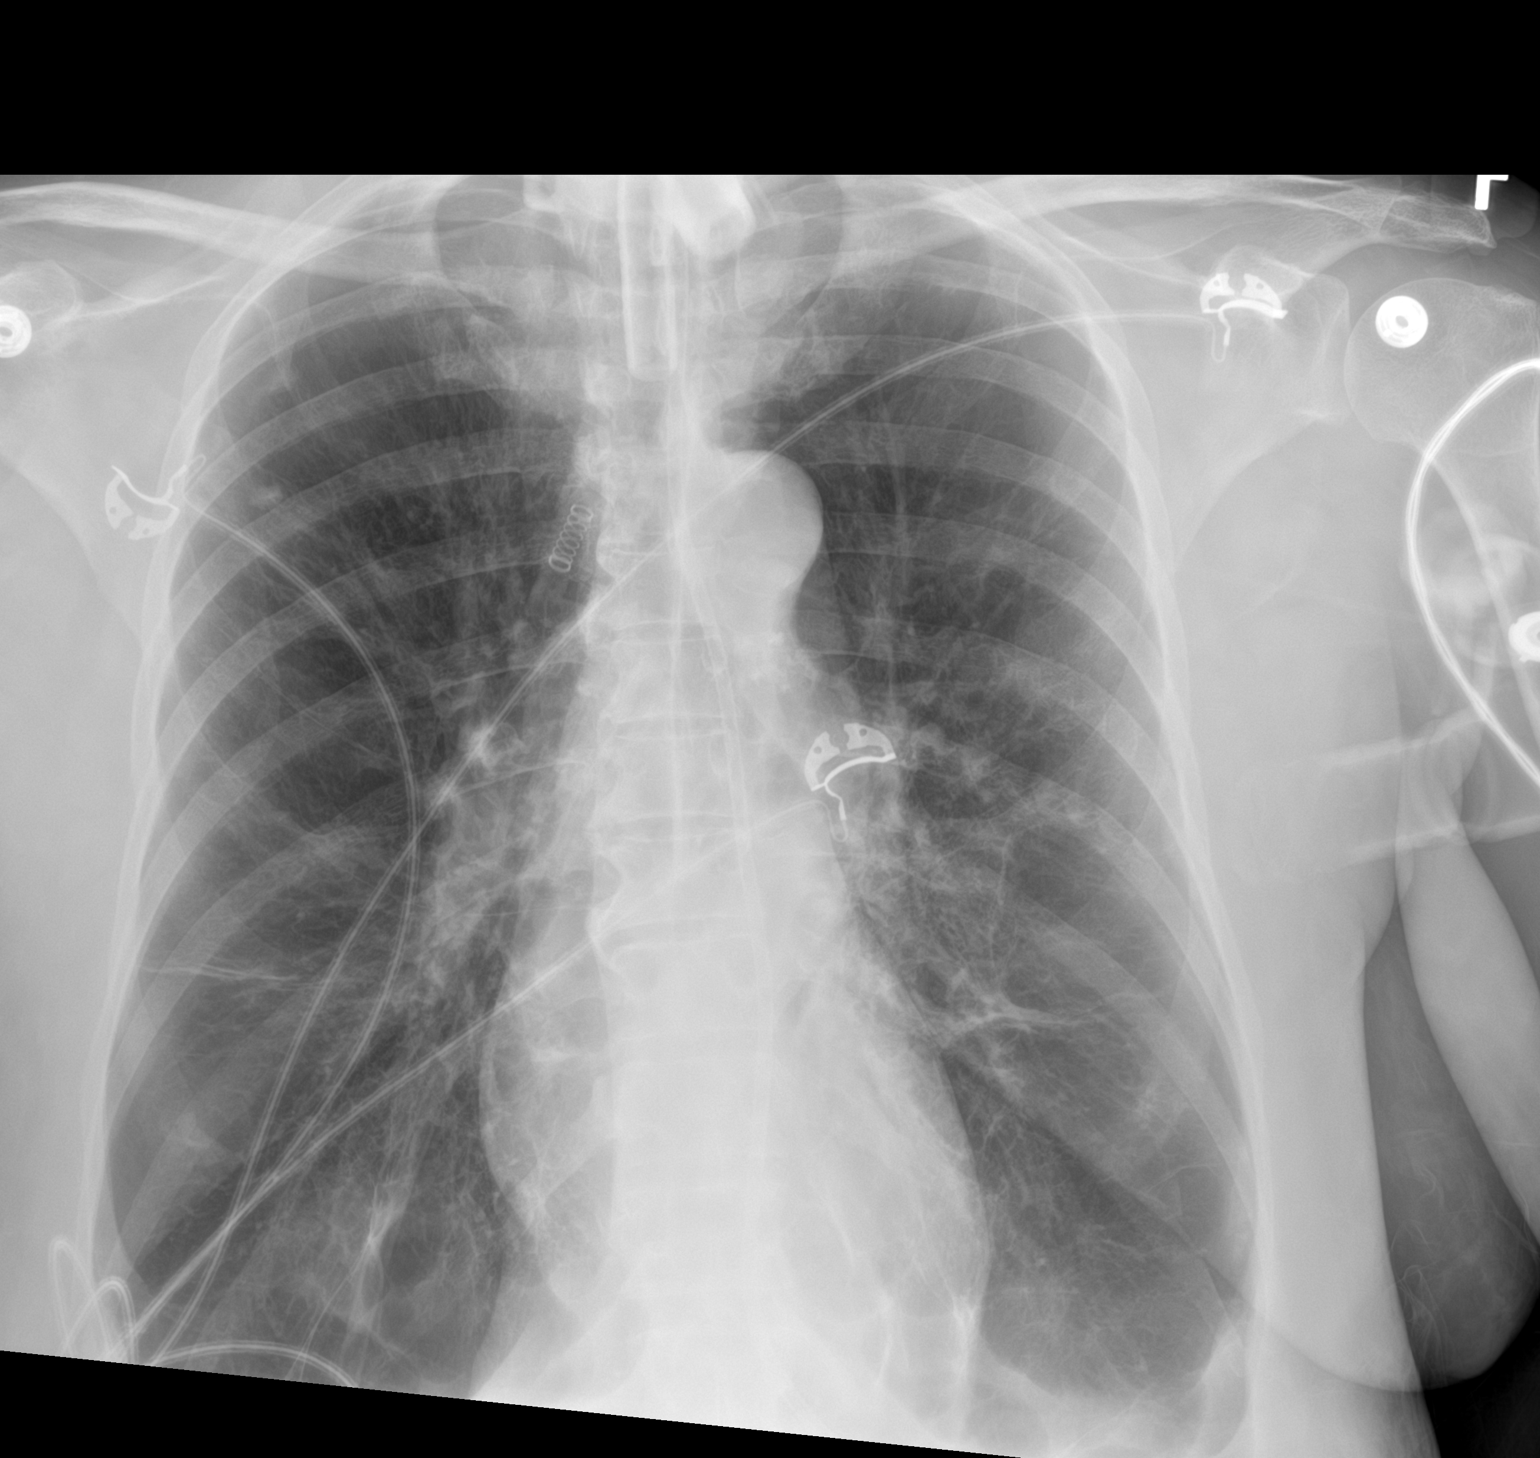

[chest ap (2 of 2)]
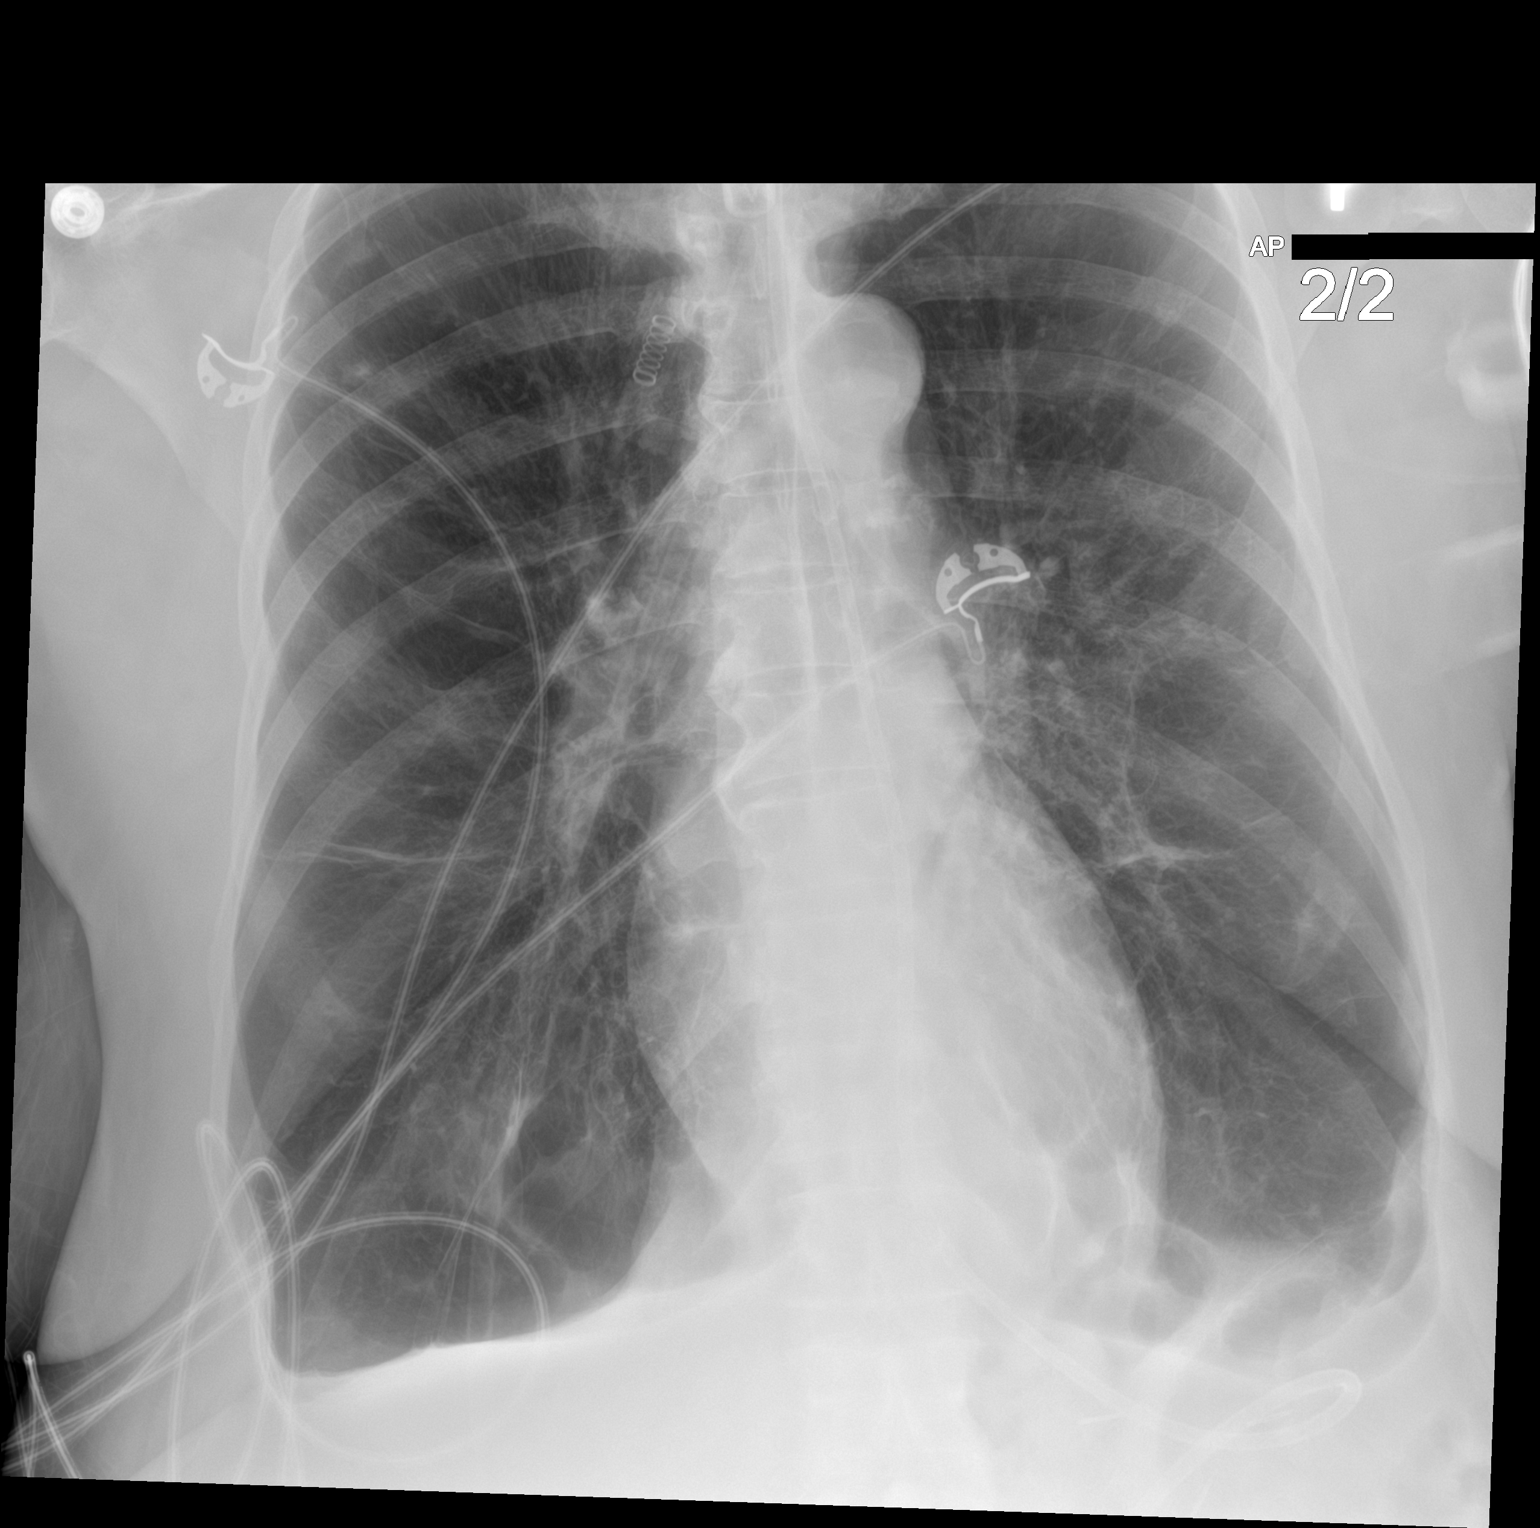

[2 of 2 positions shown; findings below may reference images not displayed]

FINDINGS: Tracheostomy stable. Feeding tube has been advanced at least as far
as the stomach, tip not seen. Pulmonary hyperinflation with
attenuated peripheral bronchovascular markings. Linear scarring or
subsegmental atelectasis in the lower lungs as before. Calcified
granuloma in the right upper lobe stable

Heart size and mediastinal contours are within normal limits.

Blunting of bilateral costophrenic angles.  No pneumothorax.

Visualized bones unremarkable.
IMPRESSION: Emphysema ([id number])  with chronic bibasilar scarring

Feeding tube placement at least as far as the stomach, tip not seen.

## 2023-07-15 IMAGING — DX DG ABDOMEN 1V
1 series · 1 of 1 positions shown · non-contrast
Comparison: Abdominal radiograph 06/12/2021.

CLINICAL DATA: 55-year-old female with history of ileus.
Respiratory failure.

EXAM:
ABDOMEN - 1 VIEW

[abdomen]
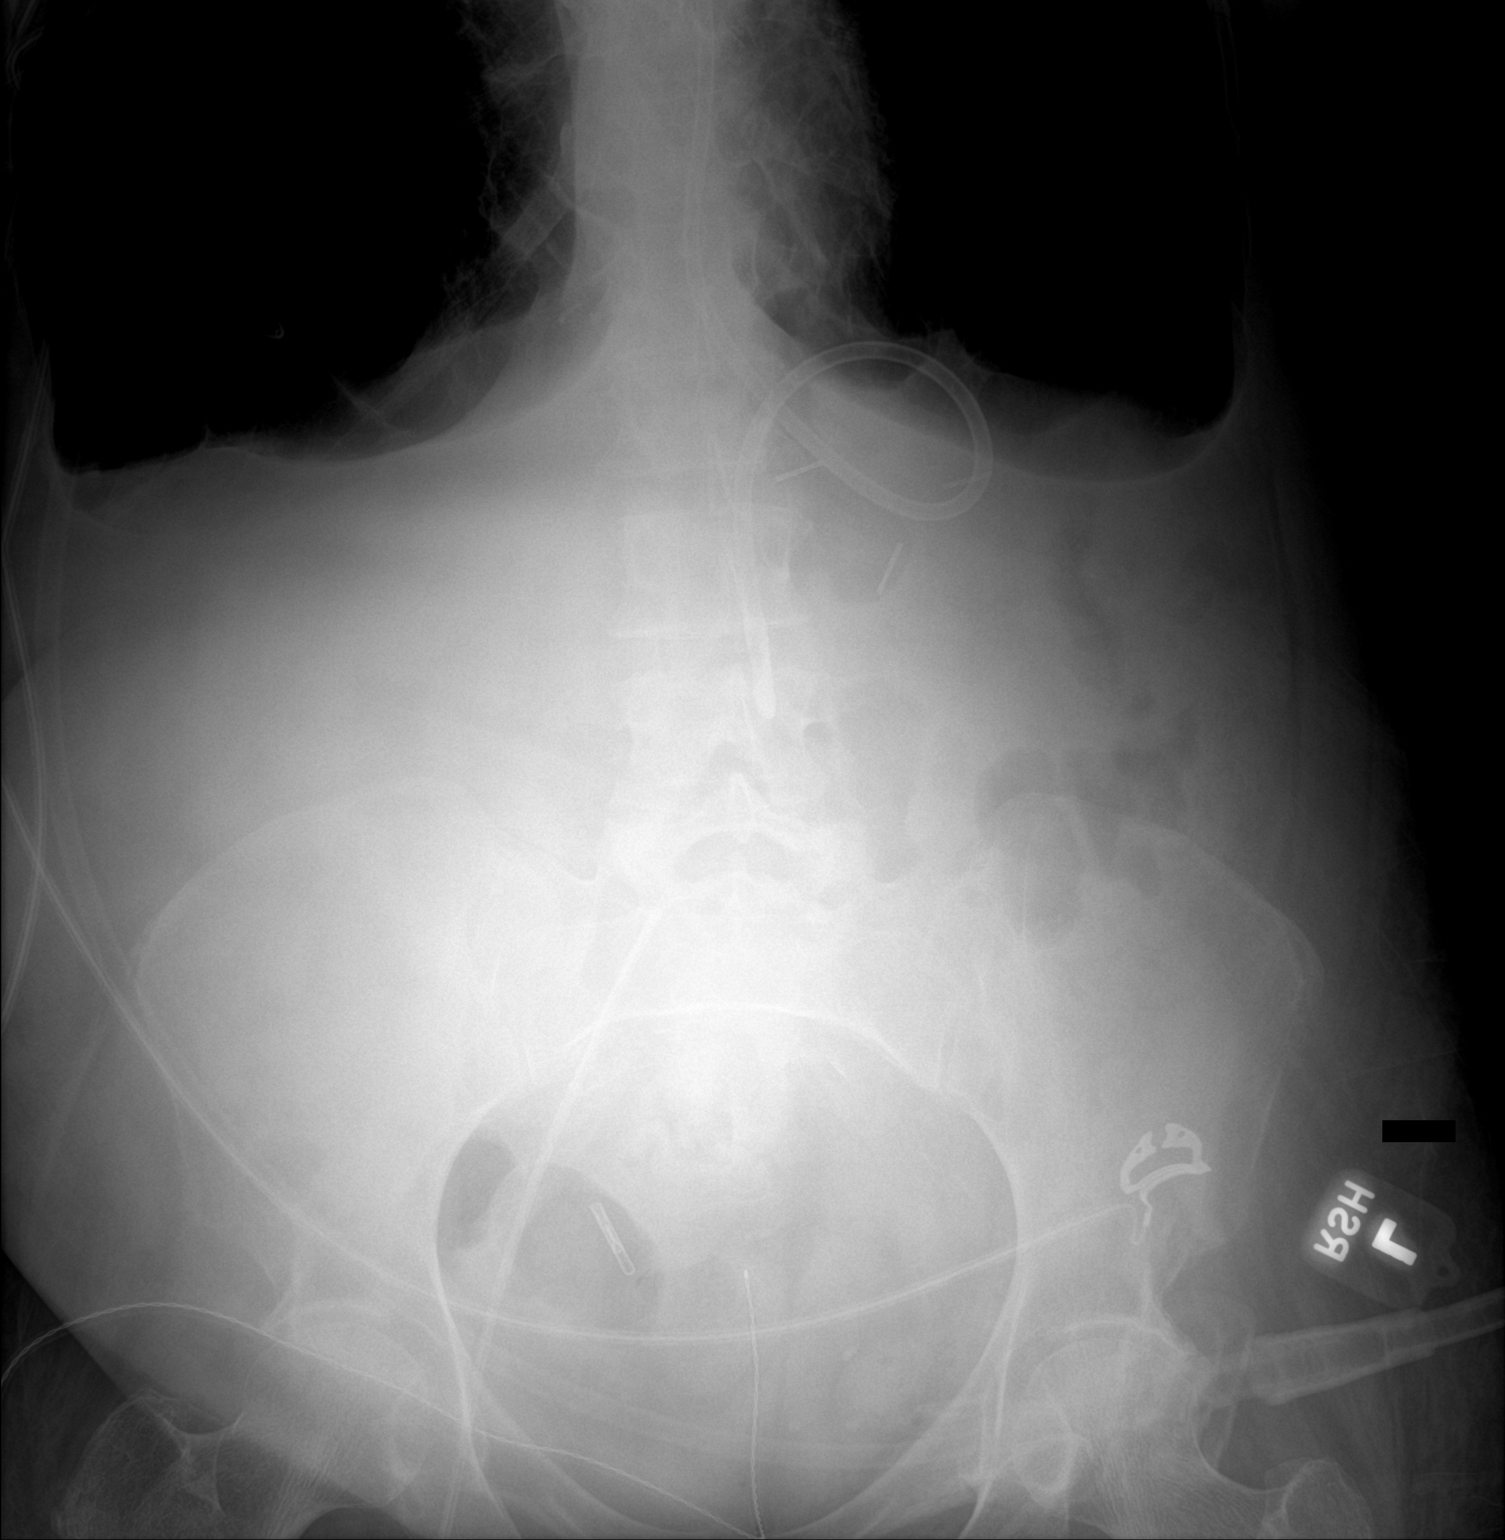

[1 of 1 positions shown; findings below may reference images not displayed]

FINDINGS: Feeding tube is coiled with tip in the upper abdomen, likely in the
mid body of the stomach. Visualized bowel gas pattern is
nonobstructive, with small amount of gas noted in the transverse
colon and paucity of small-bowel gas. No pathologic dilatation of
small bowel or colon. No gross pneumoperitoneum noted on this single
supine image. Surgical clips are noted in the epigastric region and
right-side of the pelvis. Rectal thermistor noted. Bilateral pleural
effusions are noted in the visualized lower thorax.
IMPRESSION: 1. Support apparatus, as above.
2. Nonobstructive bowel gas pattern, not suggestive of ileus.

## 2023-07-15 IMAGING — DX DG CHEST 1V PORT
1 series · 2 of 2 positions shown · non-contrast
Comparison: Portable exam 8882 hours compared to 06/13/2021

CLINICAL DATA: Tracheostomy, ventilator, respiratory failure

EXAM:
PORTABLE CHEST 1 VIEW

[Series 1: chest · 0.14mm/px · 2 of 2 slices shown]
[im 1/2]
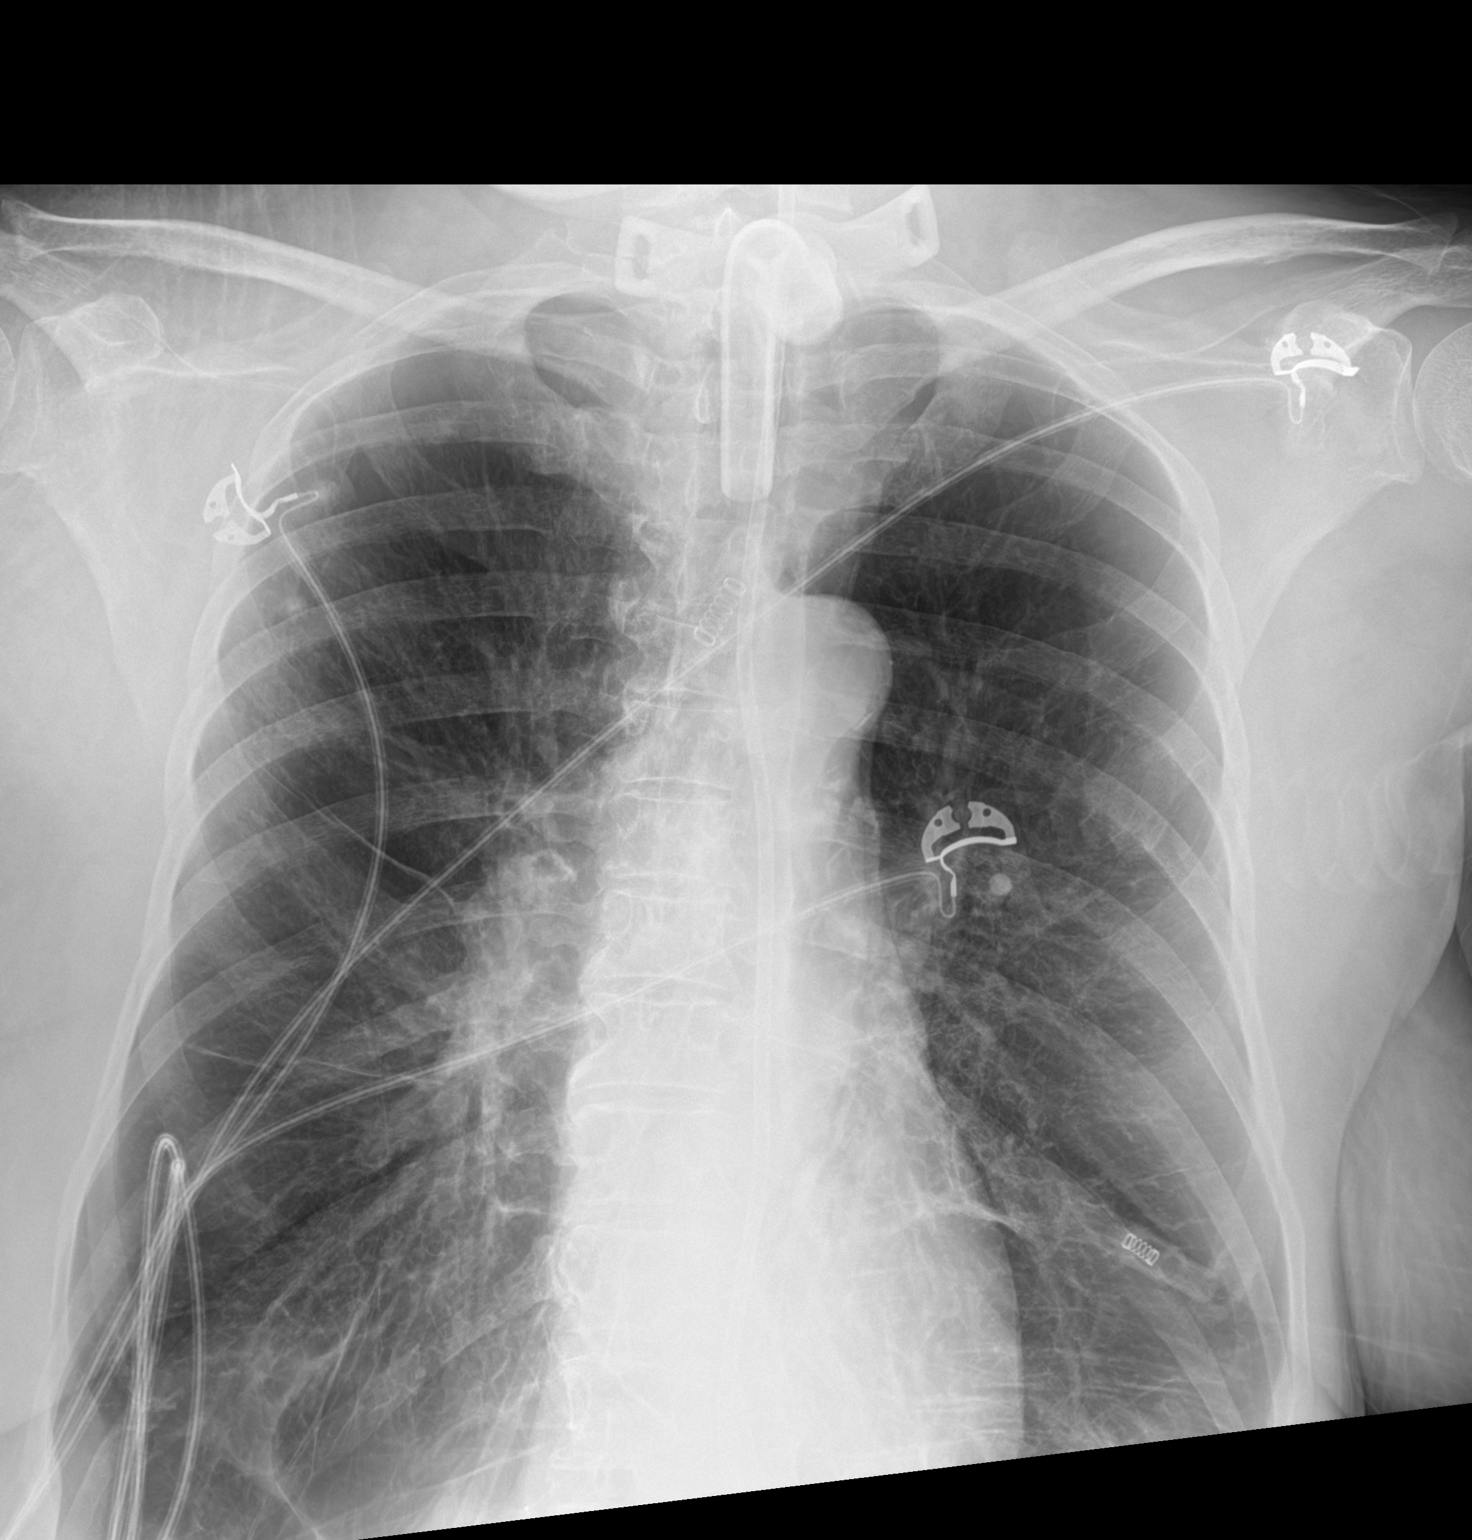
[im 2/2]
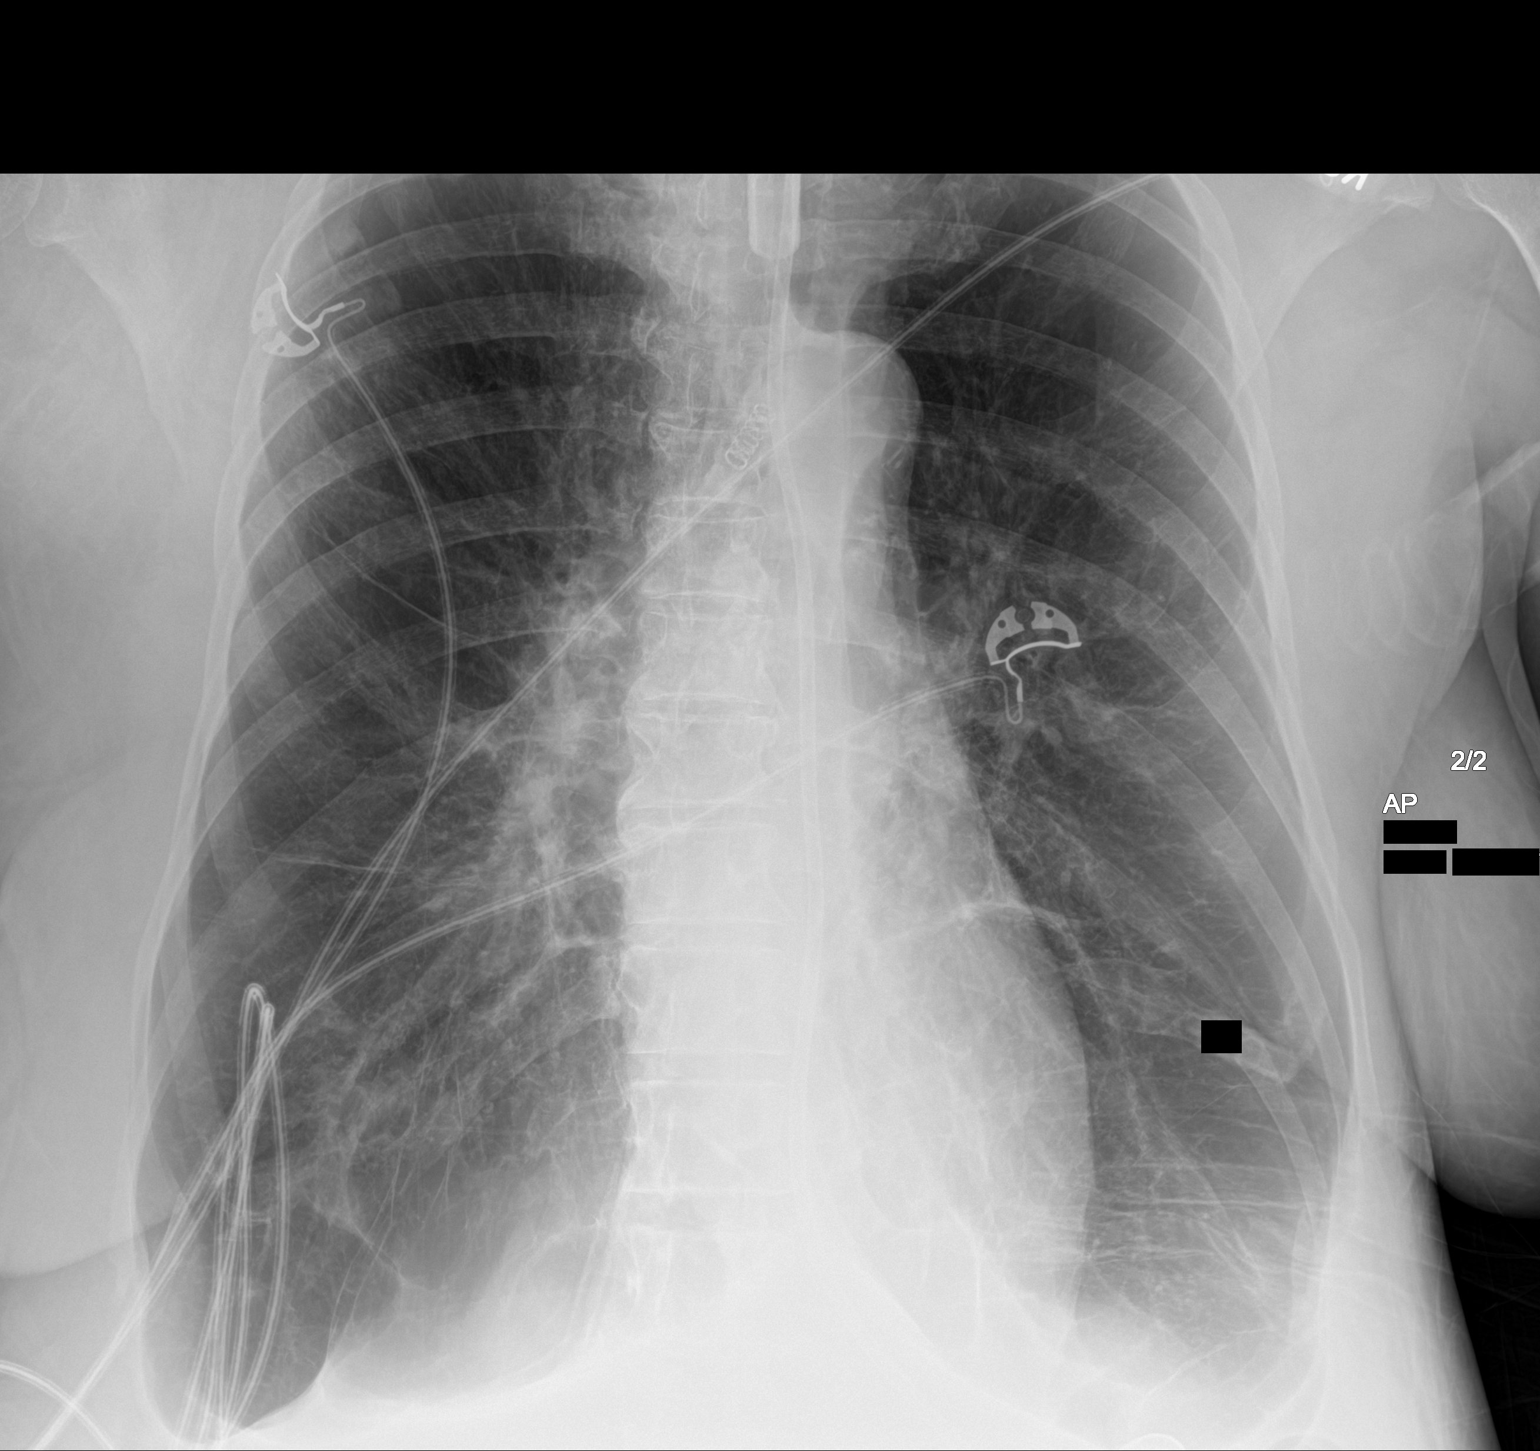

[2 of 2 positions shown; findings below may reference images not displayed]

FINDINGS: Tracheostomy tube projects over tracheal air column.

Feeding tube extends into stomach.

Normal heart size, mediastinal contours, and pulmonary vascularity.

Atherosclerotic calcification aorta.

Emphysematous and bronchitic changes consistent with COPD.

RIGHT basilar scarring.

No pulmonary infiltrate, pleural effusion, or pneumothorax.

RIGHT apex calcifications unchanged consistent with granulomata as
noted on a prior CT.

Bones demineralized.
IMPRESSION: COPD changes without acute infiltrate.

Aortic Atherosclerosis ([id number]) and Emphysema ([id number]).

## 2023-07-16 IMAGING — DX DG CHEST 1V PORT
2 series · 2 of 2 positions shown · non-contrast
Comparison: Chest radiograph dated 06/14/2021.

CLINICAL DATA: Respiratory distress.

EXAM:
PORTABLE CHEST 1 VIEW

[chest ap (1 of 2)]
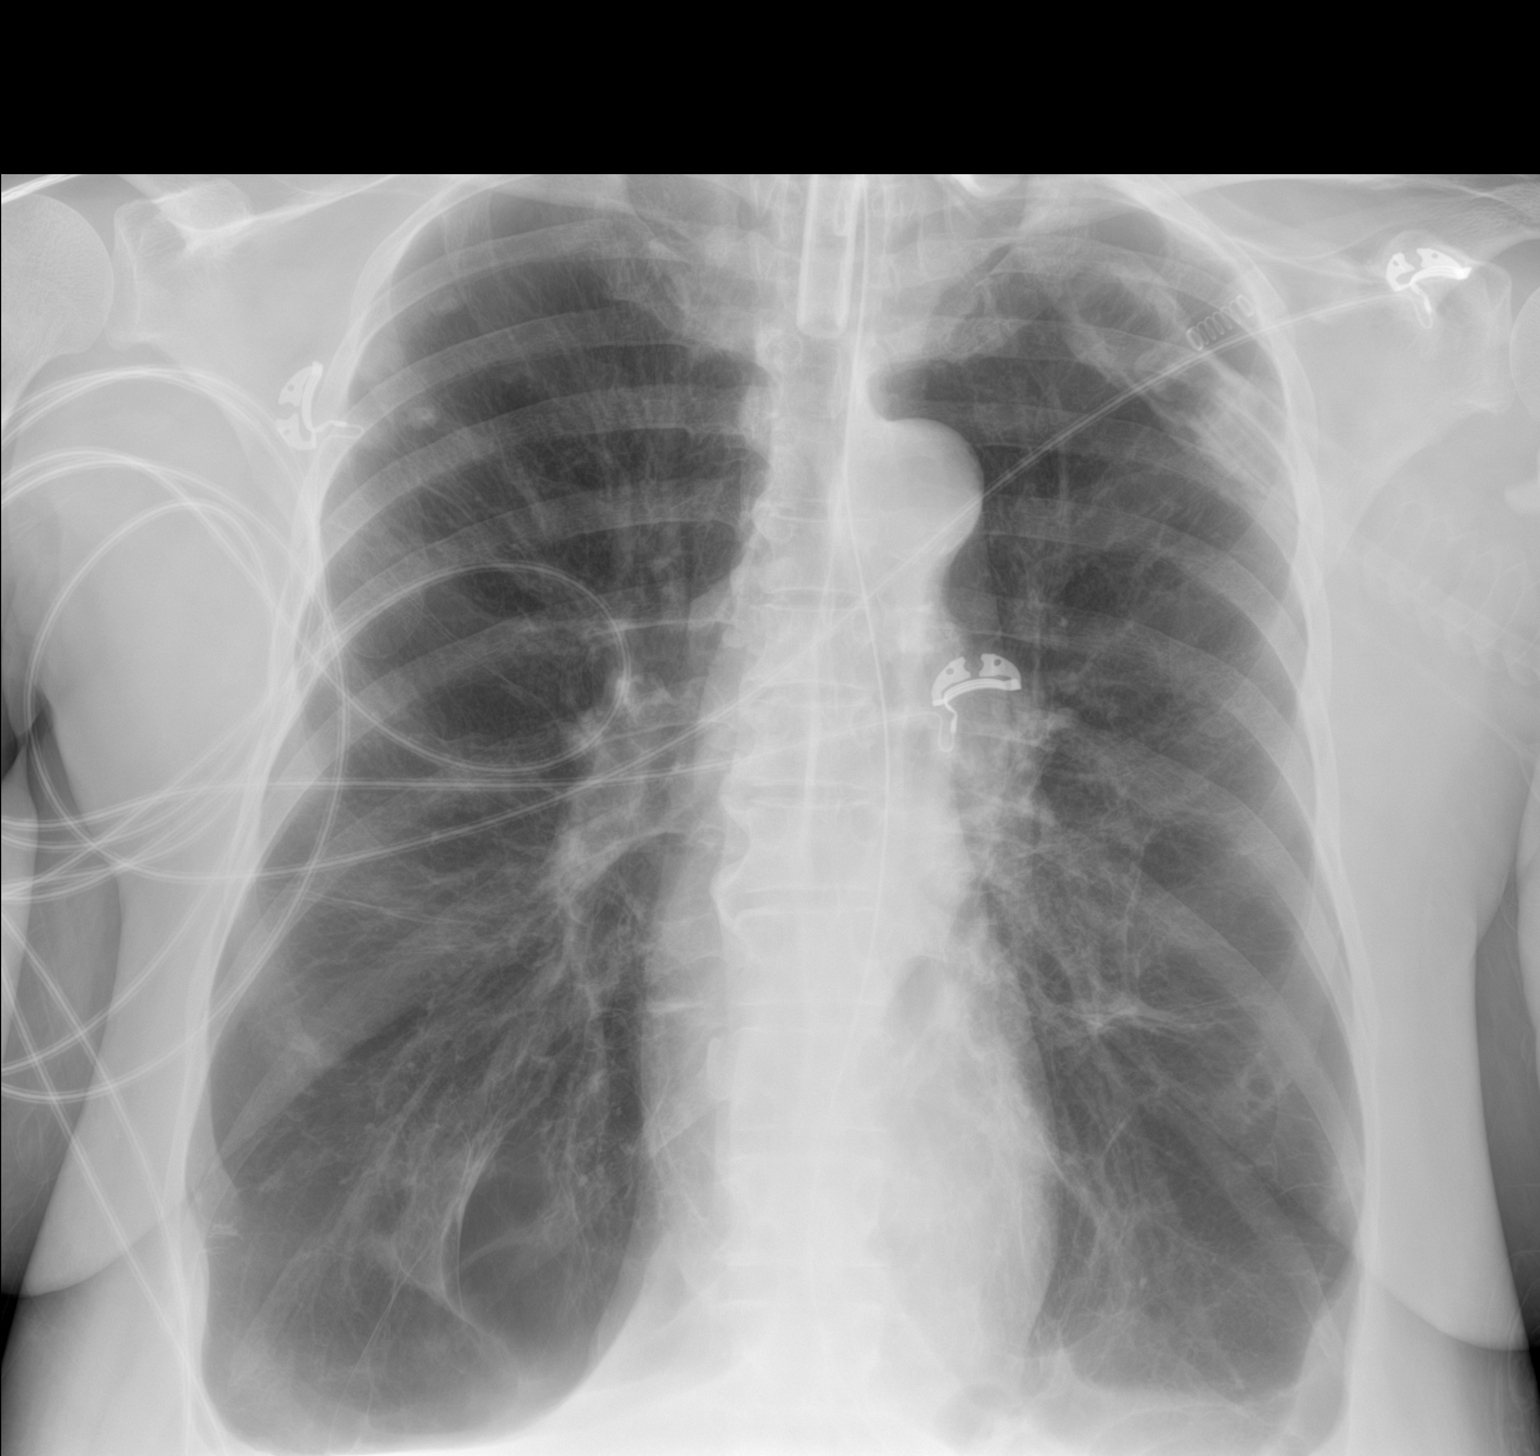

[chest ap (2 of 2)]
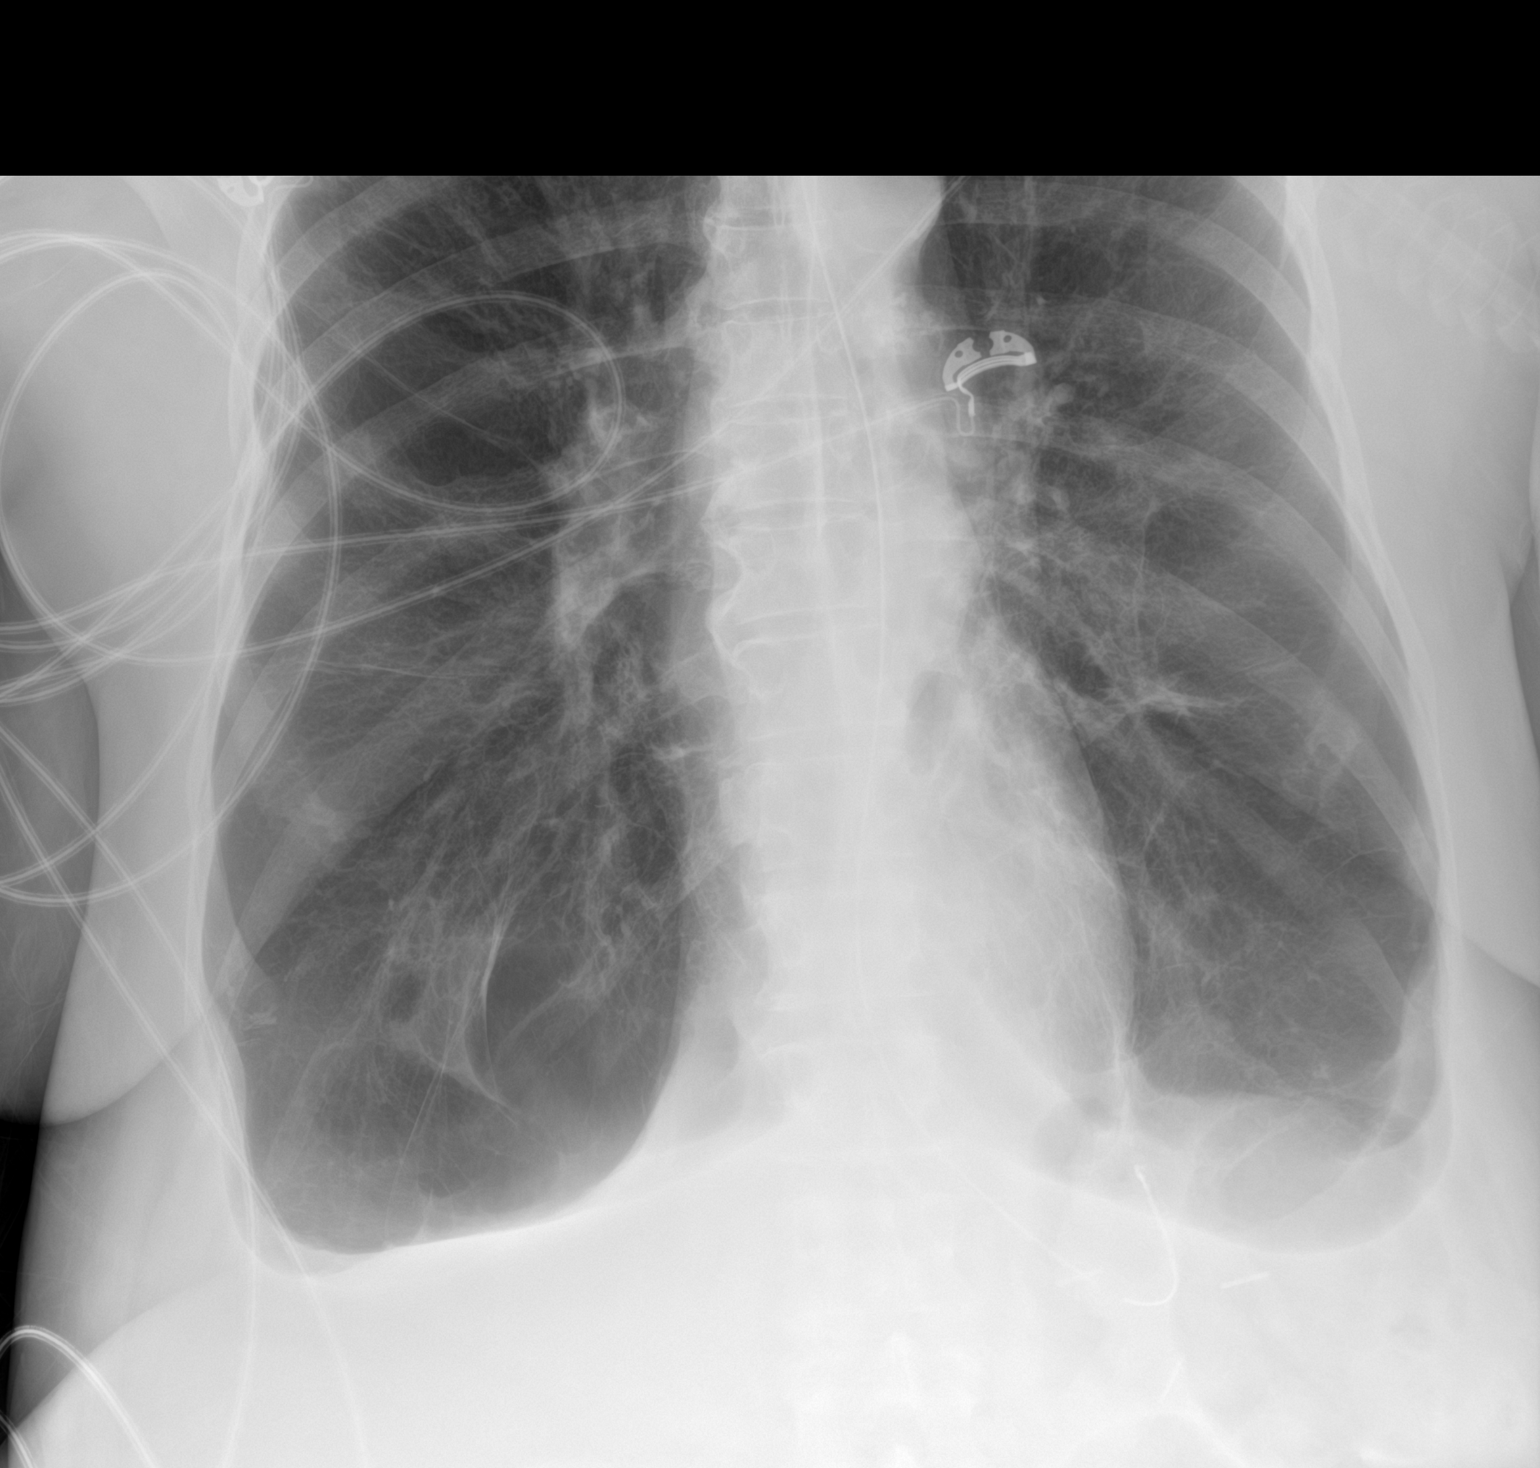

[2 of 2 positions shown; findings below may reference images not displayed]

FINDINGS: Tracheostomy above the carina in similar position. Enteric tube
extends below the diaphragm with tip in the proximal stomach likely
in the region of the gastric fundus.

Background of emphysema. No new consolidation, pleural effusion, or
pneumothorax. Small right upper lobe granuloma. The cardiac
silhouette is within normal limits. No acute osseous pathology.
IMPRESSION: 1. No acute cardiopulmonary process.
2. Emphysema.

## 2023-07-16 IMAGING — DX DG ABD PORTABLE 1V
1 series · 1 of 1 positions shown · non-contrast
Comparison: 06/14/2021

CLINICAL DATA: Nasogastric tube placement.

EXAM:
PORTABLE ABDOMEN - 1 VIEW

[abdomen kub]
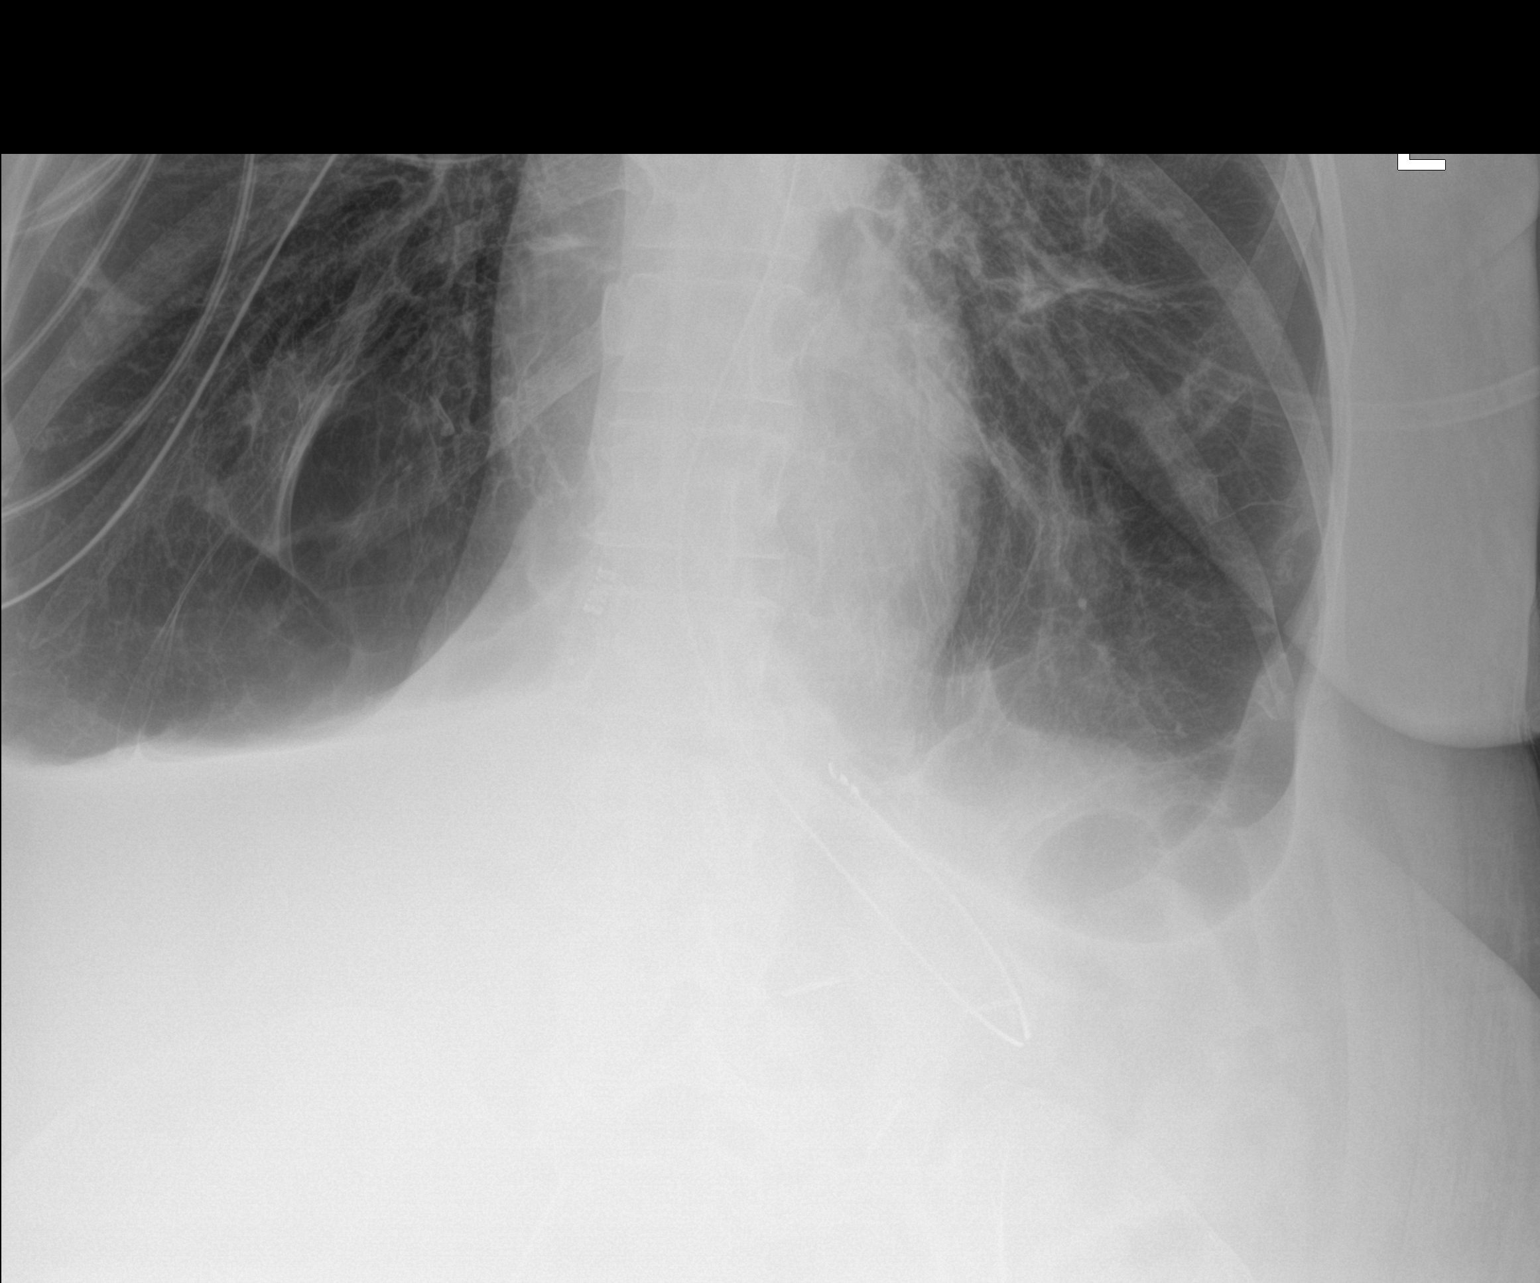

[1 of 1 positions shown; findings below may reference images not displayed]

FINDINGS: Previously seen feeding tube is no longer seen. A nasogastric tube
is now visualized with the distal tip curved back on itself
overlying the gastric fundus. Bibasilar pulmonary emphysema and
scarring again noted.
IMPRESSION: Nasogastric tube with distal tip curved back on itself overlying the
gastric fundus.

## 2023-07-19 IMAGING — DX DG ABD PORTABLE 1V
1 series · 1 of 1 positions shown · non-contrast
Comparison: 06/15/2021

CLINICAL DATA: NG tube placement

EXAM:
PORTABLE ABDOMEN - 1 VIEW

[abdomen]
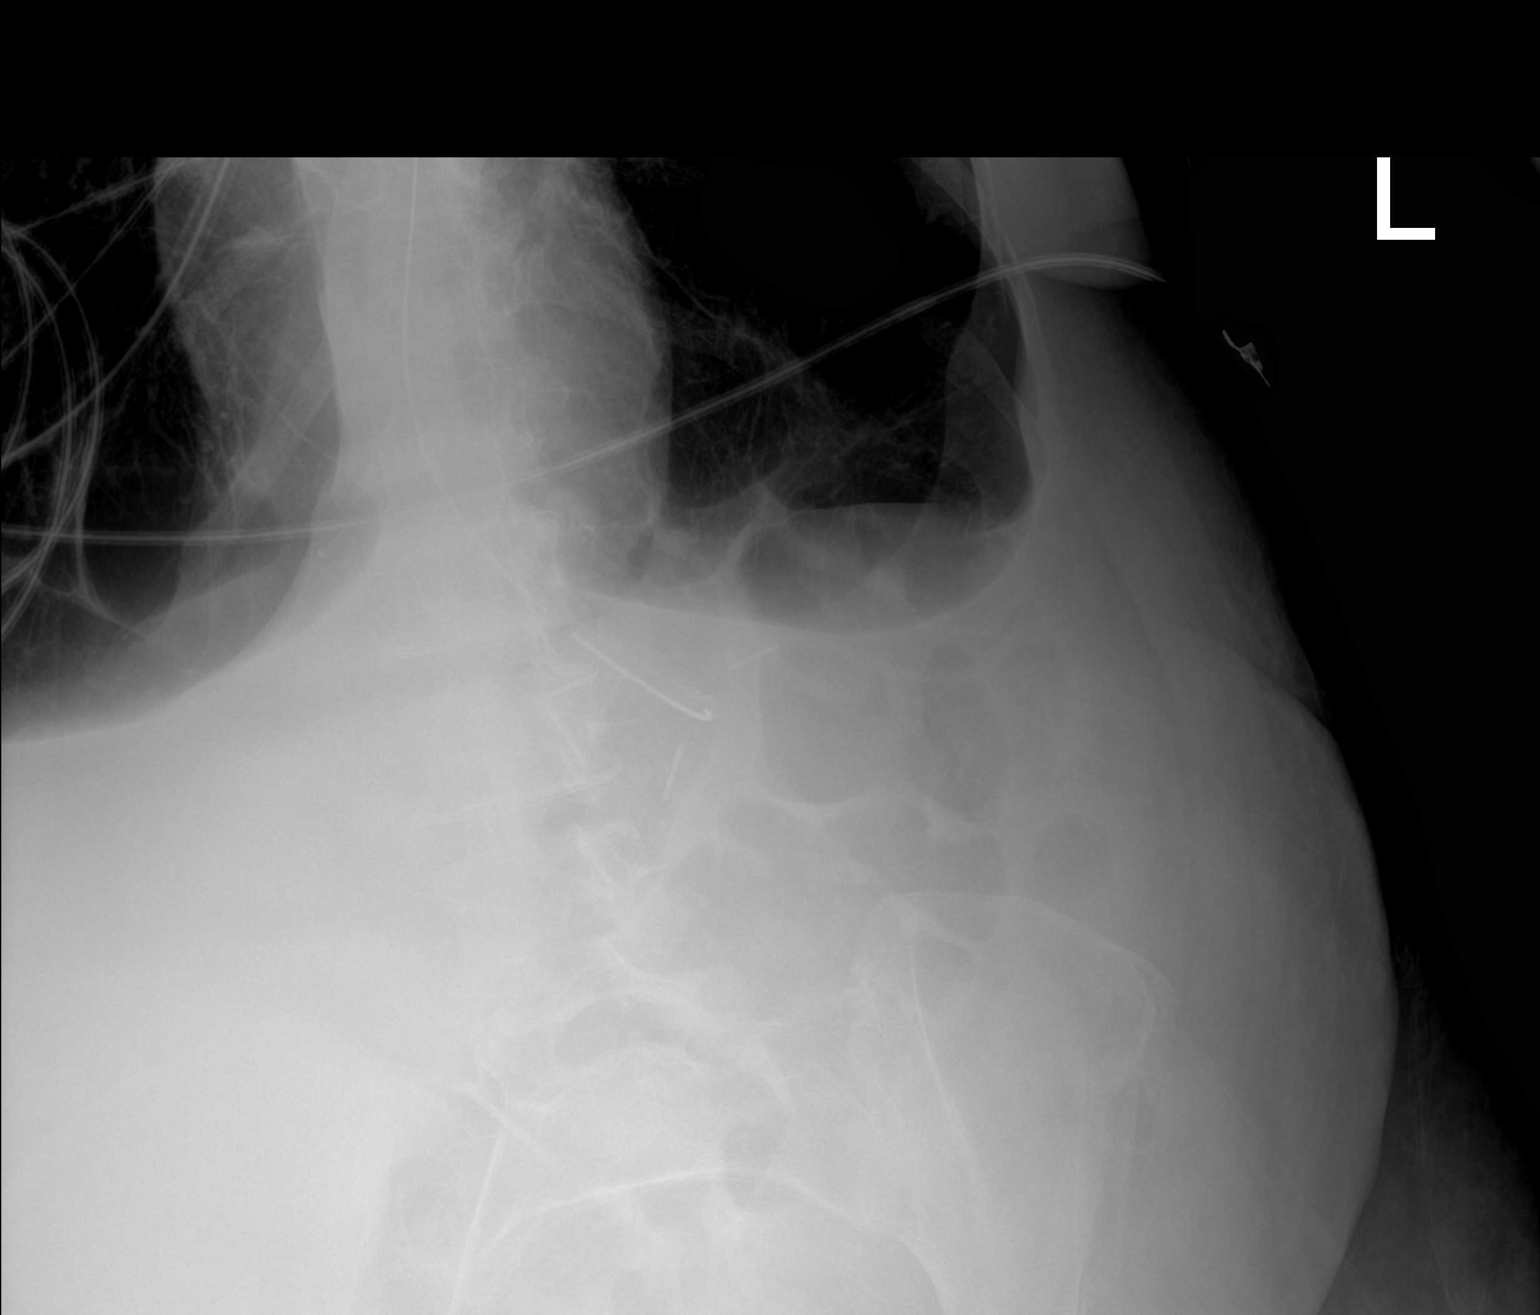

[1 of 1 positions shown; findings below may reference images not displayed]

FINDINGS: Nasogastric tube with the tip projecting over the stomach, but the
proximal port is at the esophagogastric junction. Recommend
advancing the nasogastric tube 10 cm. No bowel dilatation to suggest
obstruction. No evidence of pneumoperitoneum, portal venous gas or
pneumatosis.

No pathologic calcifications along the expected course of the
ureters.

No acute osseous abnormality.
IMPRESSION: Nasogastric tube with the tip projecting over the stomach, but the
proximal port is at the esophagogastric junction. Recommend
advancing the nasogastric tube 10 cm.

## 2023-07-22 IMAGING — DX DG CHEST 1V PORT
1 series · 1 of 1 positions shown · non-contrast
Comparison: [DATE] [DATE], [DATE] ([DATE] p.m.)

CLINICAL DATA: Acute respiratory failure.

EXAM:
PORTABLE CHEST 1 VIEW

[chest ap]
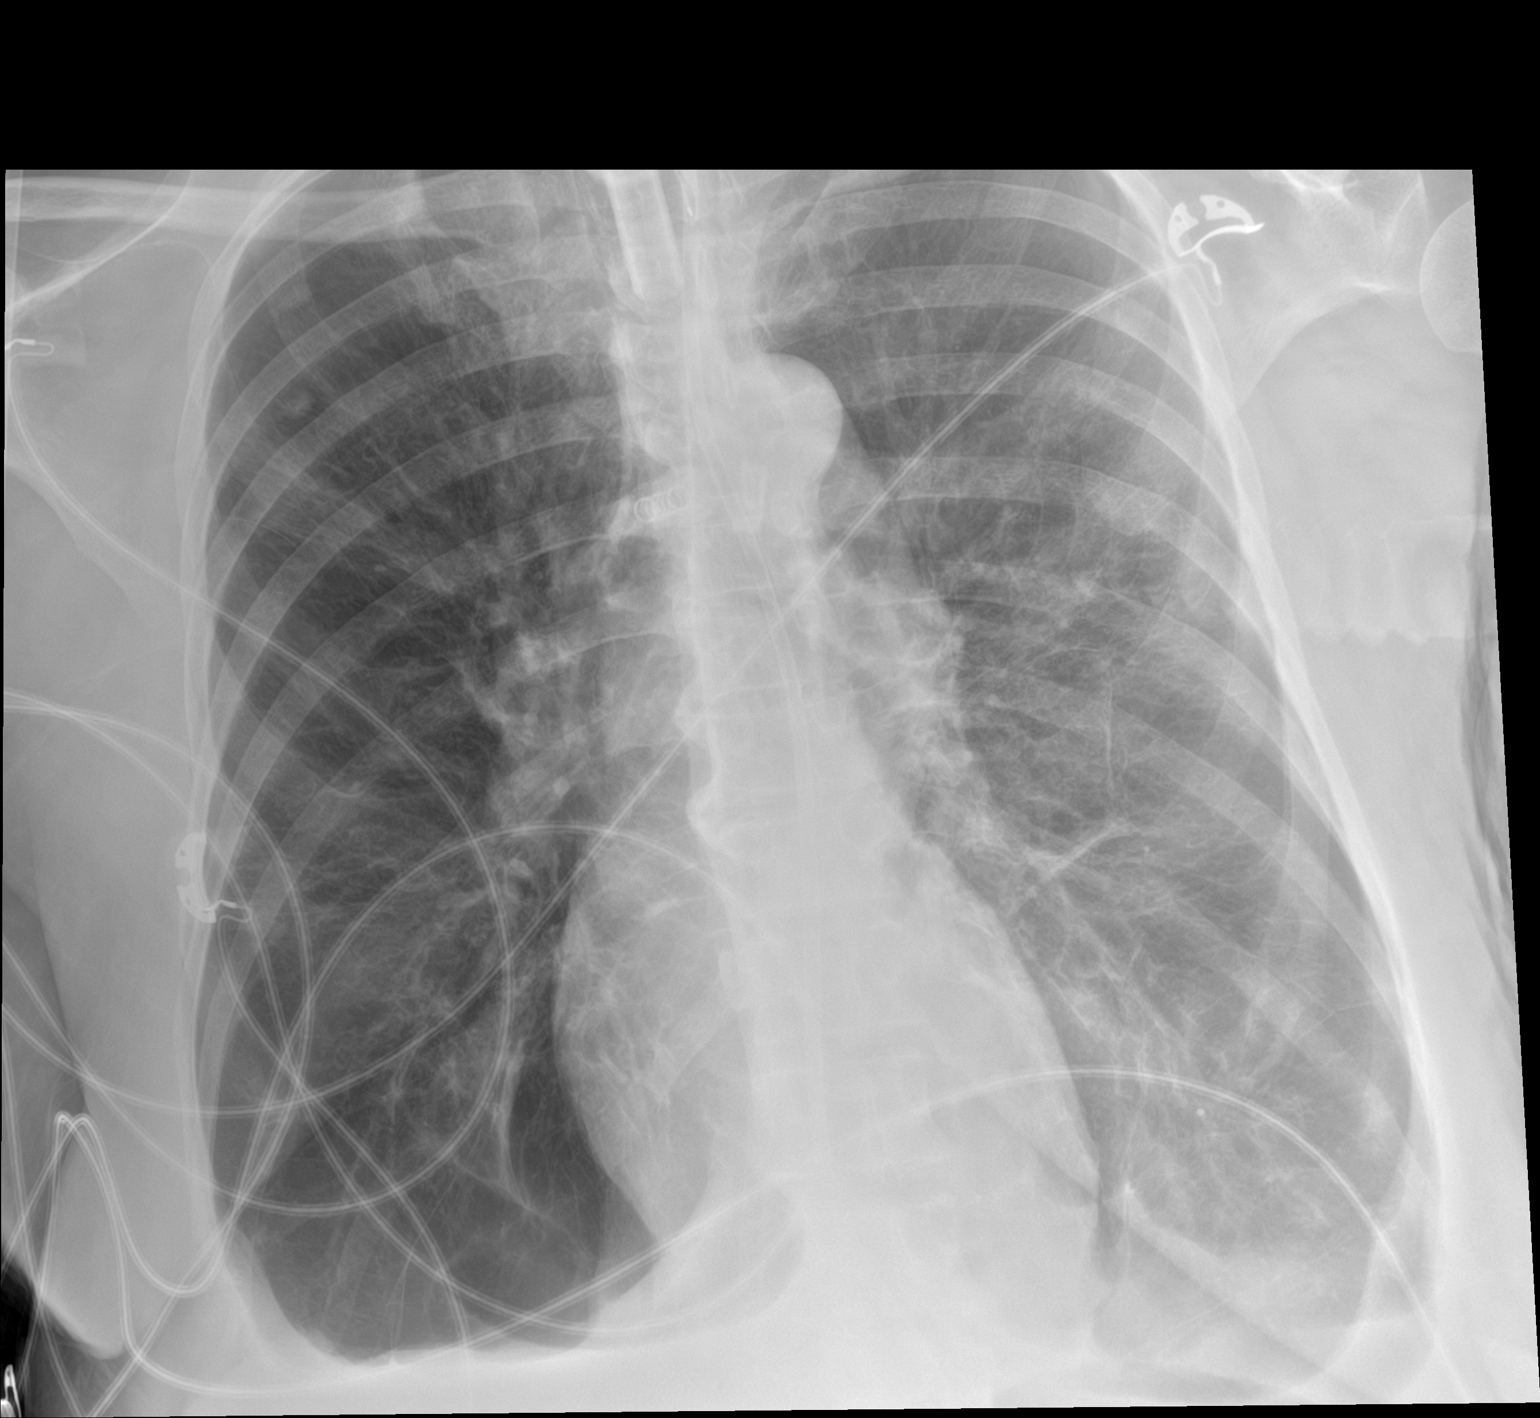

[1 of 1 positions shown; findings below may reference images not displayed]

FINDINGS: There is stable tracheostomy tube and enteric tube positioning. The
lungs are hyperinflated. Stable, diffuse emphysematous lung disease
is seen with stable calcified granulomas noted within the right
upper lobe. Mild atelectasis and/or early infiltrate is present
within the left lung base. There is a small right pleural effusion.
No pneumothorax is identified. The heart size and mediastinal
contours are within normal limits. The visualized skeletal
structures are unremarkable.
IMPRESSION: 1. Stable, diffuse emphysematous lung disease.
2. Mild, stable left basilar atelectasis and/or early infiltrate.
3. Small right pleural effusion.

## 2023-07-22 IMAGING — US US RENAL
1 series · 14 of 25 positions shown · non-contrast
Comparison: 02/01/2020

CLINICAL DATA: Renal dysfunction

EXAM:
RENAL / URINARY TRACT ULTRASOUND COMPLETE

[Series 1: us renal · 14 of 40 slices shown]
[im 1/40]
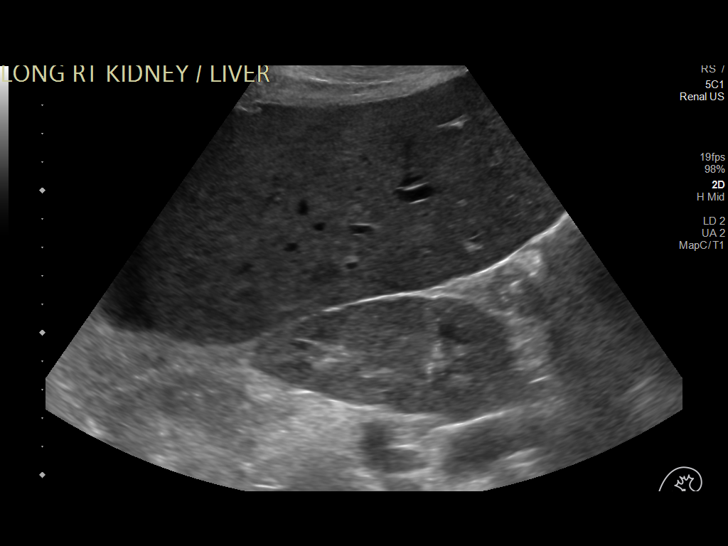
[im 4/40]
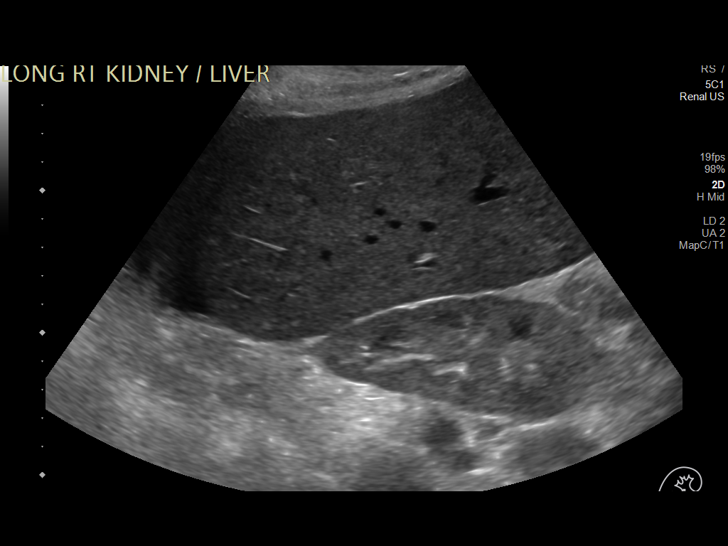
[im 7/40]
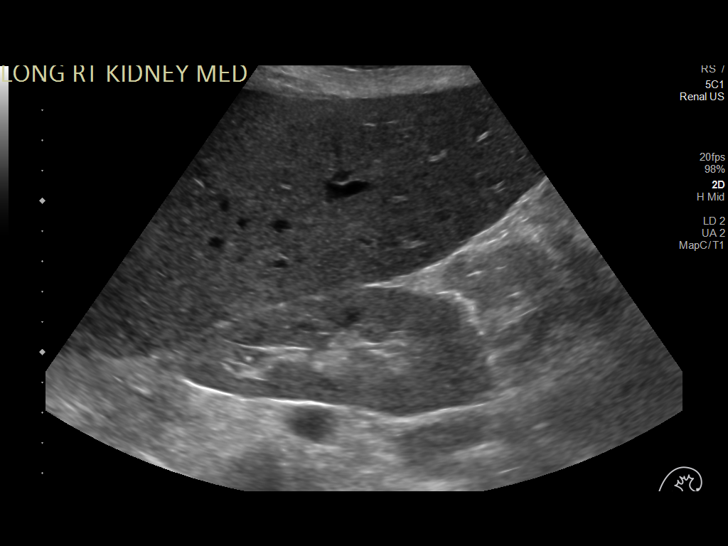
[im 10/40]
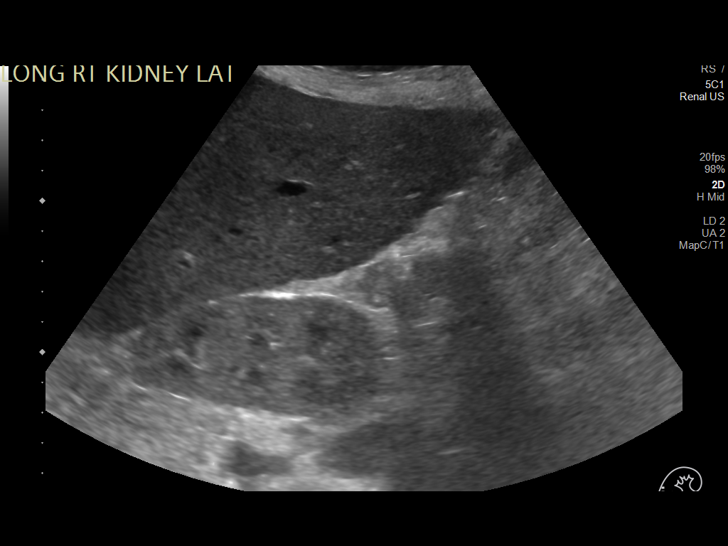
[im 14/40]
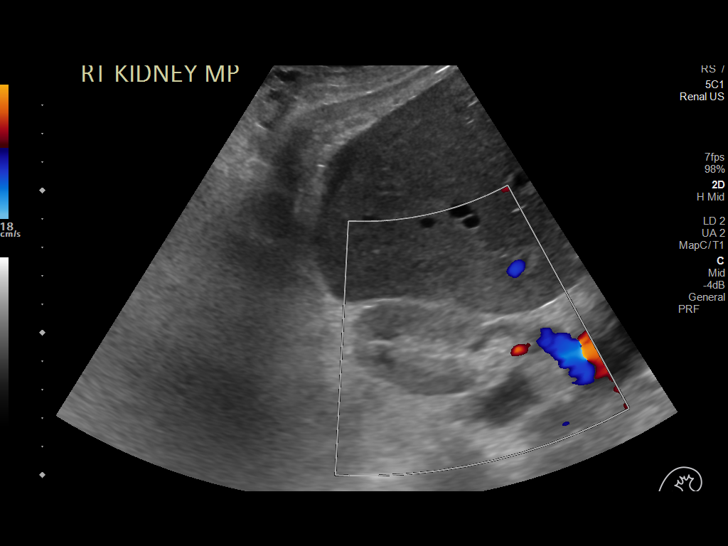
[im 15/40]
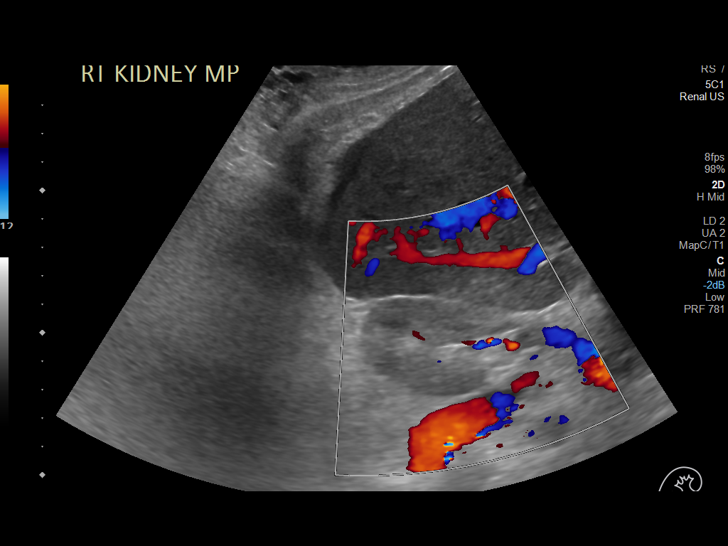
[im 18/40]
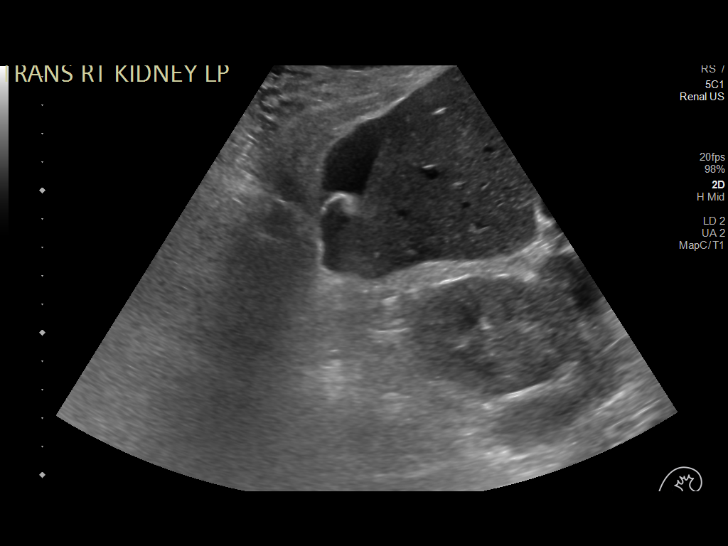
[im 22/40]
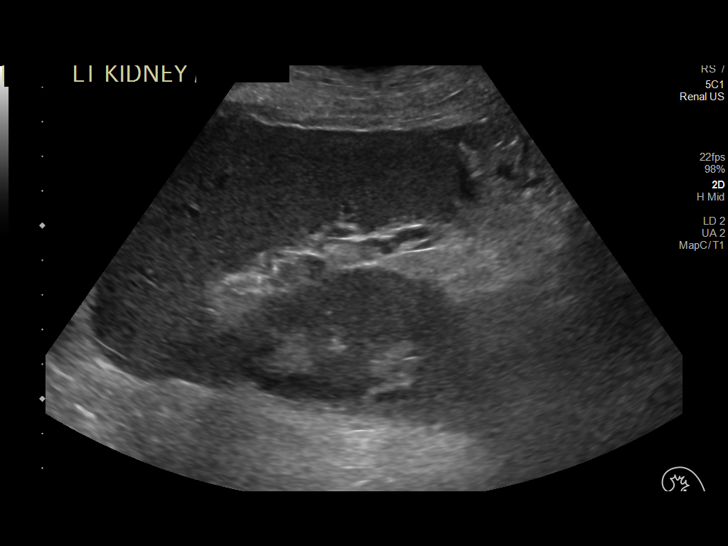
[im 25/40]
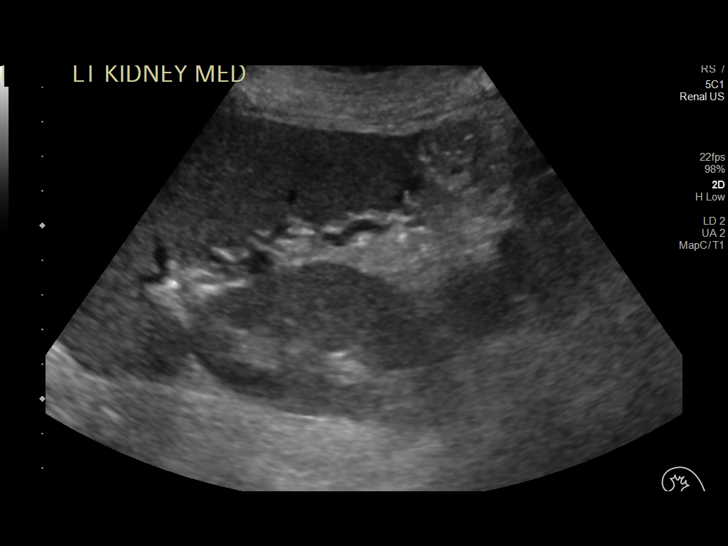
[im 27/40]
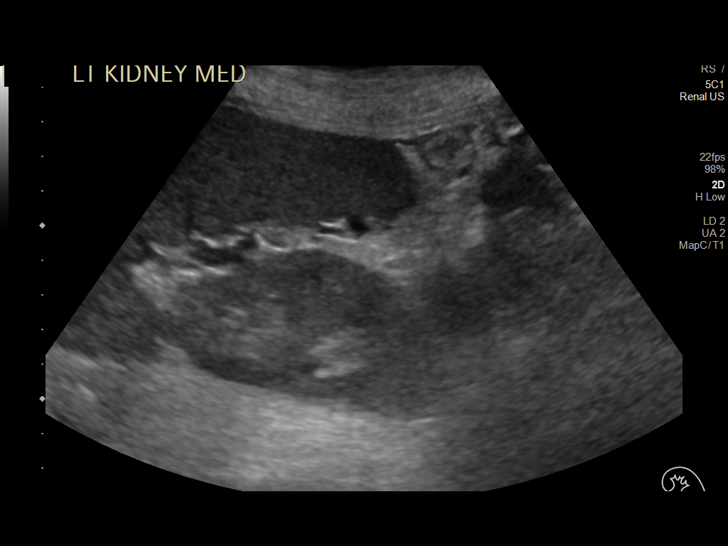
[im 30/40]
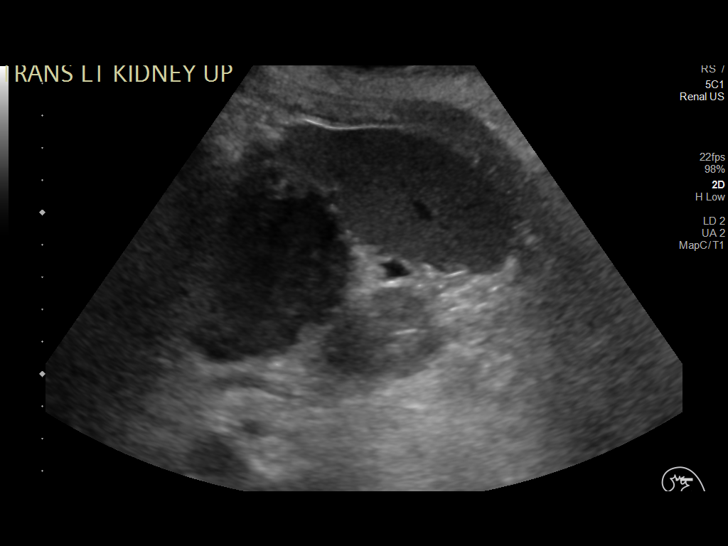
[im 33/40]
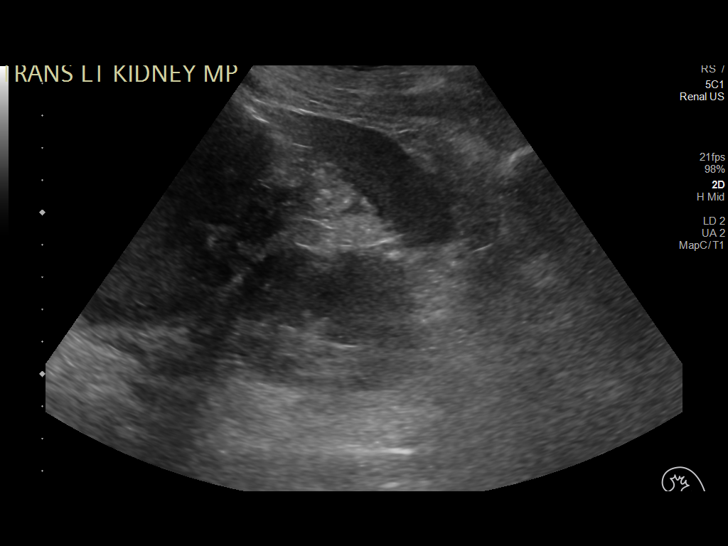
[im 36/40]
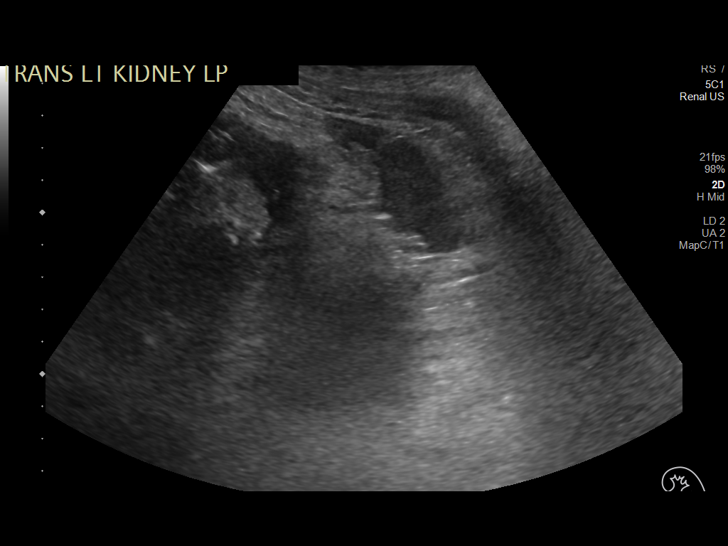
[im 40/40]
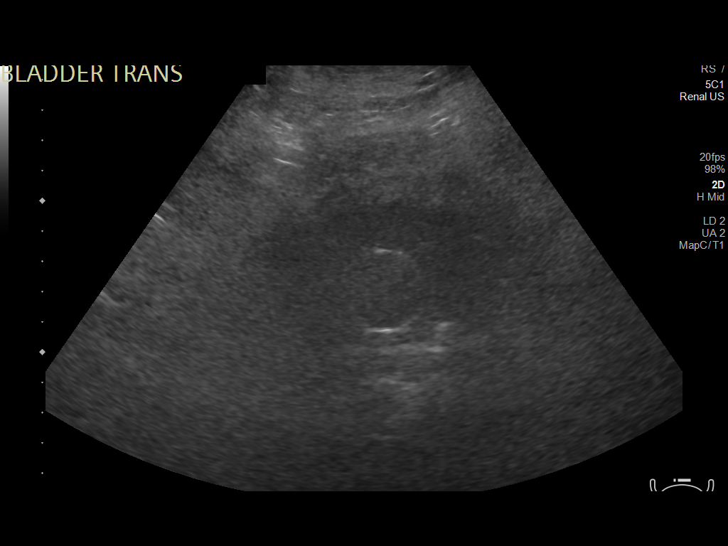

[14 of 25 positions shown; findings below may reference images not displayed]

FINDINGS: Right Kidney:

Renal measurements: 10.7 x 4.3 x 5.8 cm = volume: 137.5 mL. There is
no hydronephrosis. There is increased cortical echogenicity.

Left Kidney:

Renal measurements: 8.5 x 4.5 x 4.4 cm = volume: 88.1 mL. There is
no hydronephrosis. There is increased cortical echogenicity.

Bladder:

Foley catheter is seen in the bladder. Bladder is not distended
limiting evaluation.

Other:

Small ascites is present.
IMPRESSION: There is no hydronephrosis. There is increased cortical echogenicity
in both kidneys suggesting medical renal disease. Small ascites.

## 2023-07-22 IMAGING — DX DG CHEST 1V PORT
2 series · 2 of 2 positions shown · non-contrast
Comparison: 06/17/2021

CLINICAL DATA: Acute respiratory failure

EXAM:
PORTABLE CHEST 1 VIEW

[chest ap (1 of 2)]
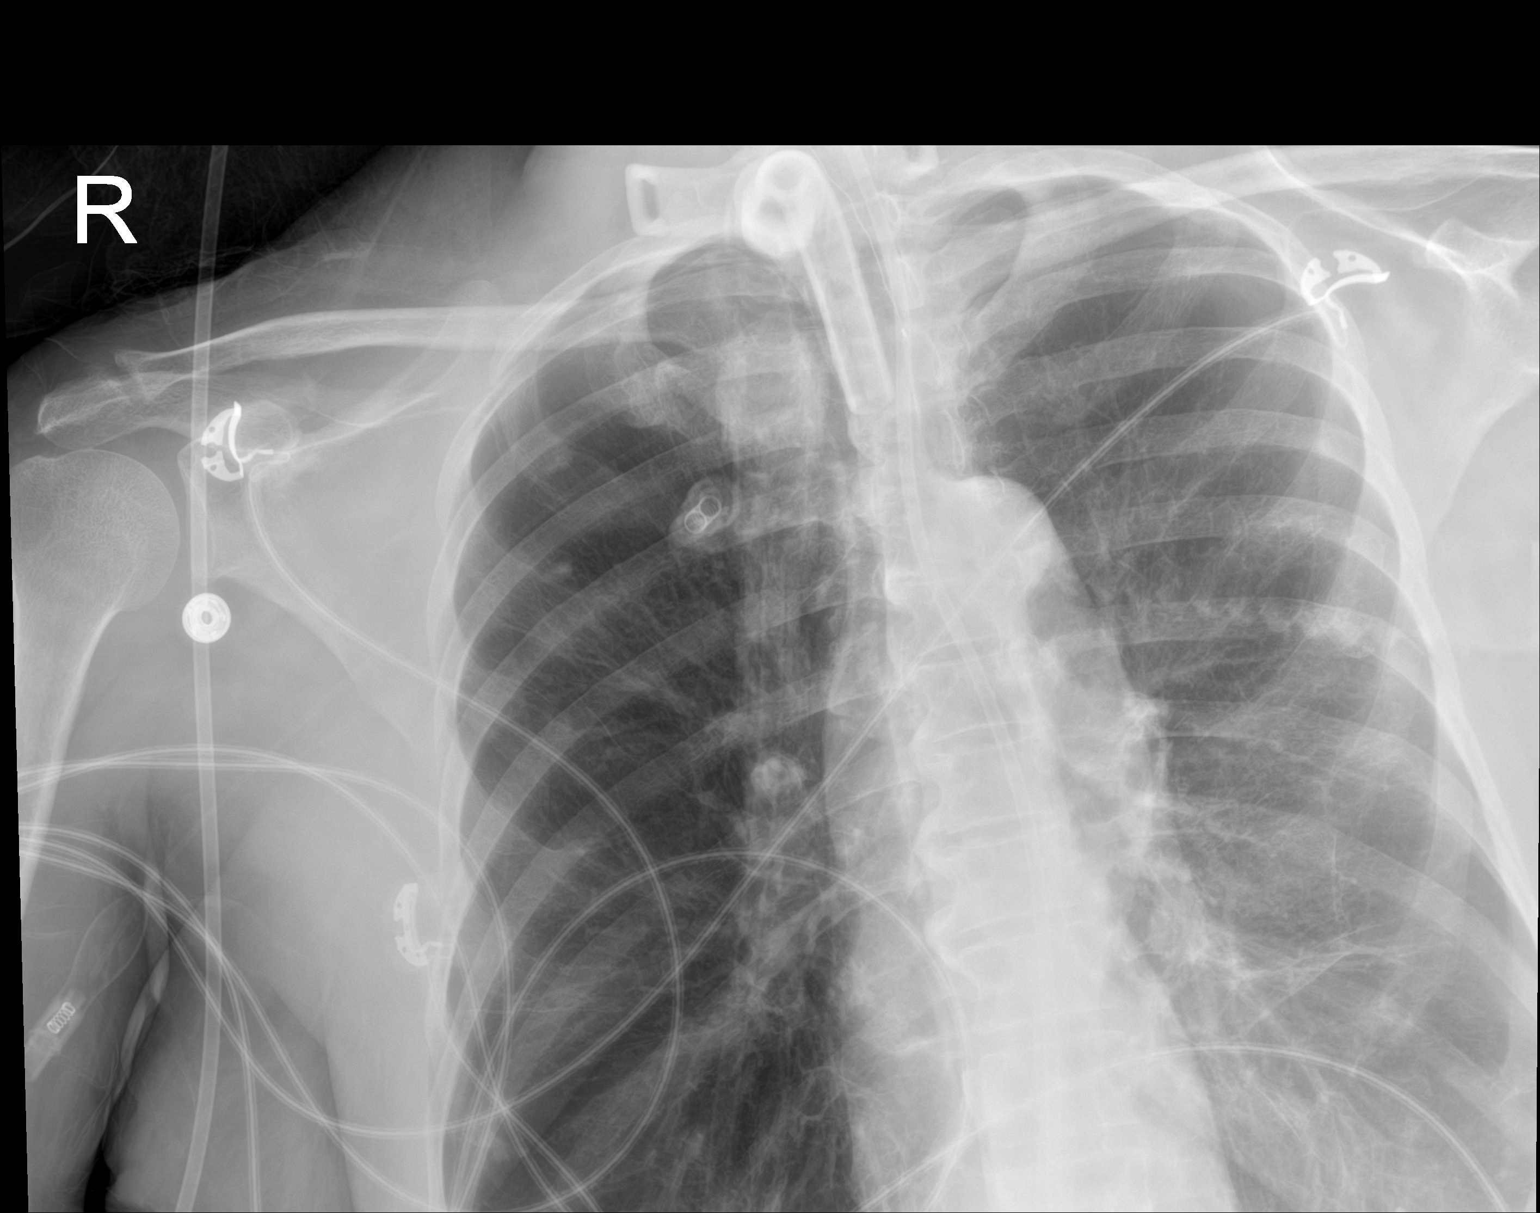

[chest ap (2 of 2)]
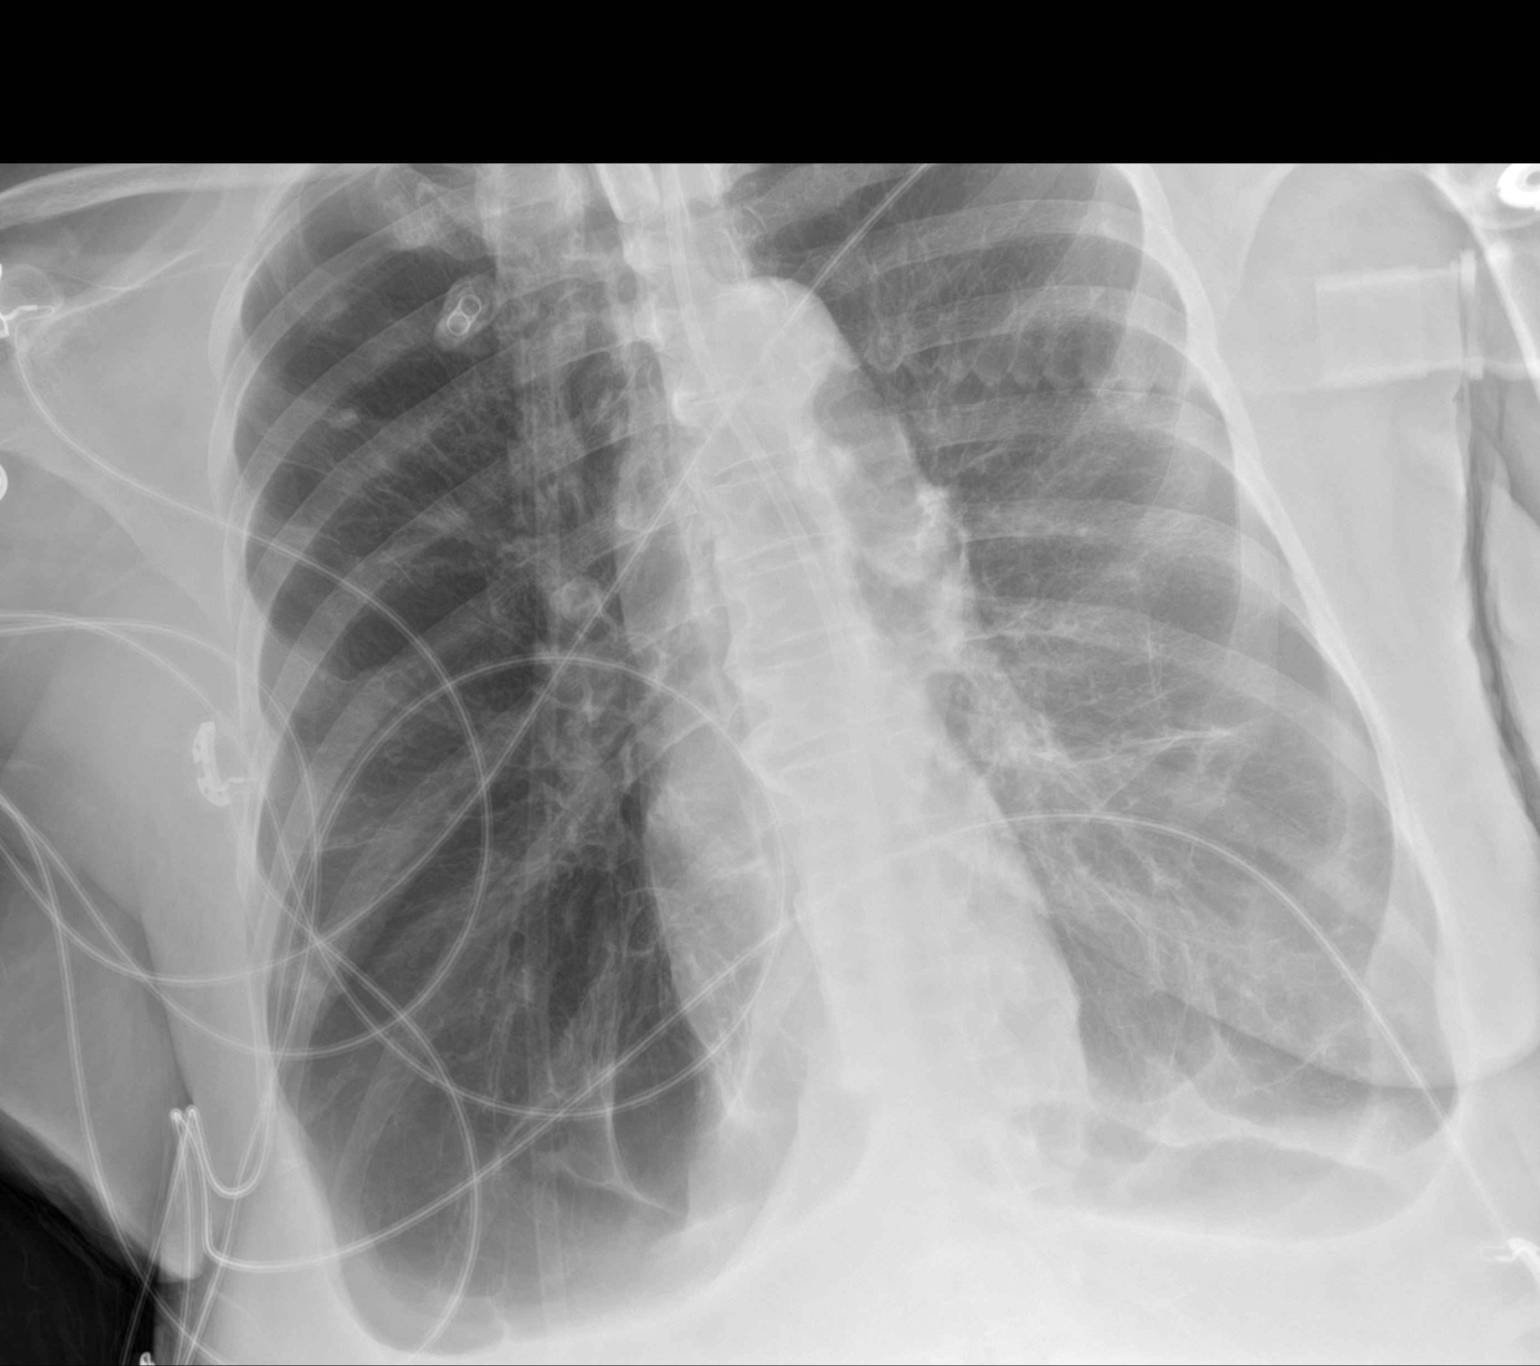

[2 of 2 positions shown; findings below may reference images not displayed]

FINDINGS: Tracheostomy tube is noted in satisfactory position. Feeding
catheter extends into the stomach. Cardiac shadow is stable.
Emphysematous changes are again seen and stable. Calcified granuloma
in the right upper lobe is noted and stable. No bony abnormality is
seen. No sizable effusion is noted.
IMPRESSION: COPD without acute abnormality.

## 2023-07-23 IMAGING — DX DG CHEST 1V PORT
1 series · 2 of 2 positions shown · non-contrast
Comparison: Chest radiograph dated June 21, 2021

CLINICAL DATA: Chest pain, tracheostomy tube present

EXAM:
PORTABLE CHEST 1 VIEW

[Series 1: chest · 0.14mm/px · 2 of 2 slices shown]
[im 1/2]
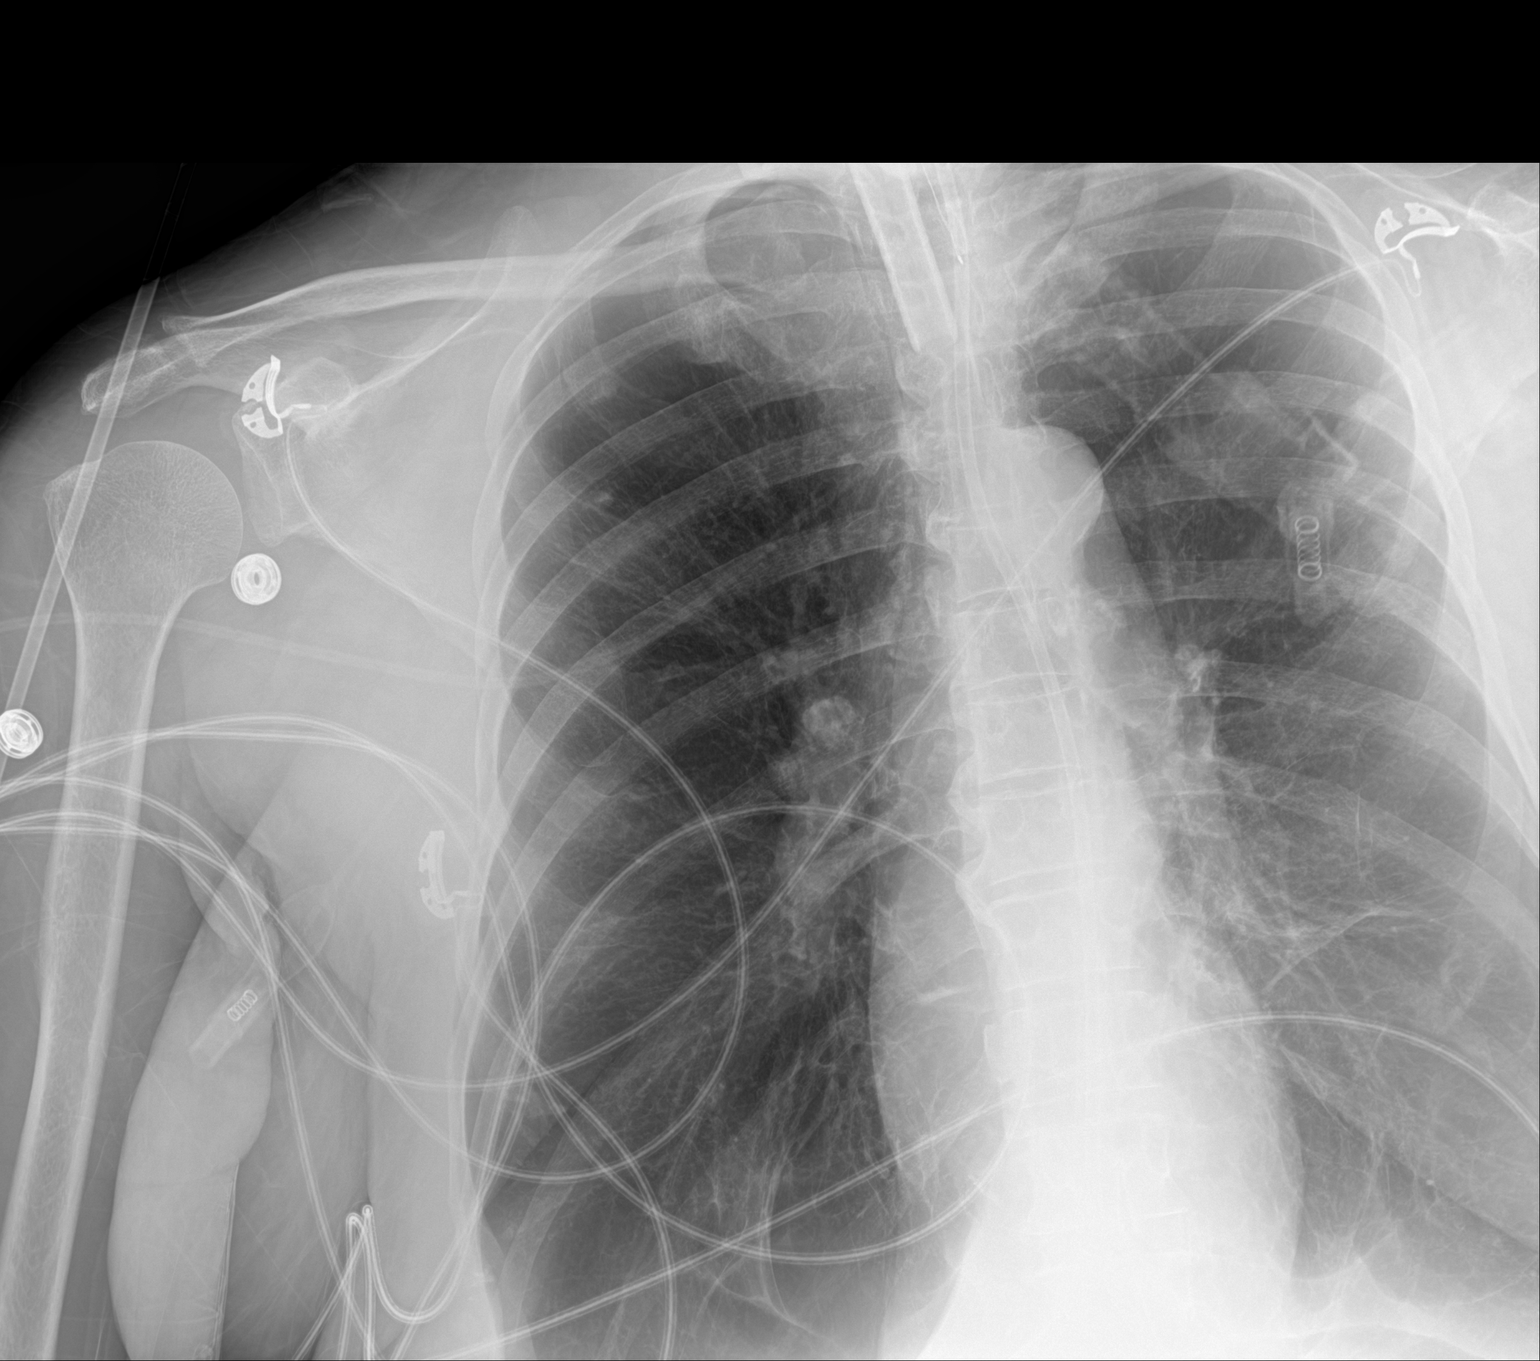
[im 2/2]
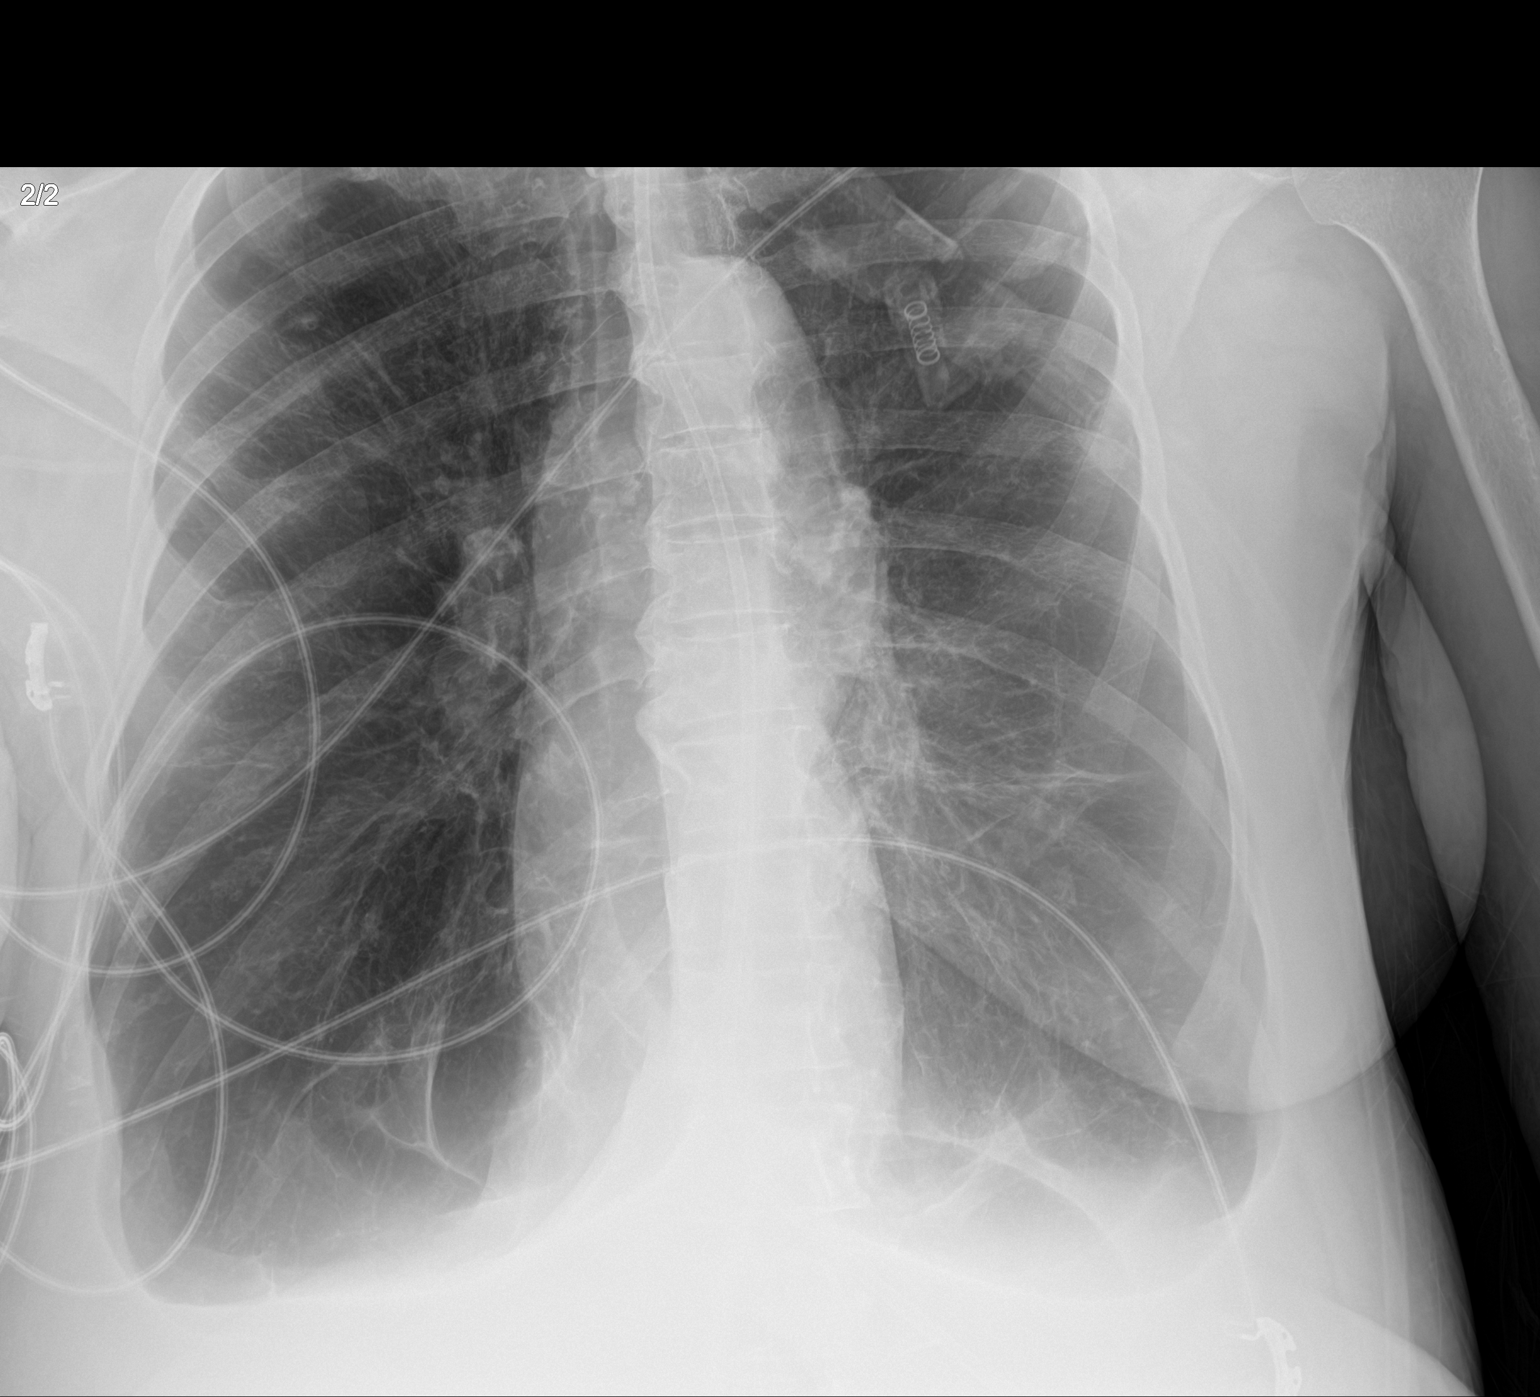

[2 of 2 positions shown; findings below may reference images not displayed]

FINDINGS: The heart size and mediastinal contours are within normal limits.
Hyperinflated lungs with moderate emphysematous changes. Bibasilar
fibrotic changes. Small bilateral pleural effusions. No appreciable
pneumothorax tracheostomy tube with distal tip approximately 5 cm
above the carina. Feeding tube coursing below the diaphragm with
distal tip not included. The visualized skeletal structures are
unremarkable.
IMPRESSION: 1.  Tracheostomy tube and feeding tubes are unchanged.

2. Moderate emphysema with bibasilar fibrotic changes and small
bilateral pleural effusions. No appreciable pneumothorax.
